# Patient Record
Sex: Female | Born: 1940
Health system: Southern US, Community
[De-identification: ages and names within clinical notes are randomized; demographics above are authoritative.]

## PROBLEM LIST (undated history)

## (undated) DIAGNOSIS — N390 Urinary tract infection, site not specified: Secondary | ICD-10-CM

## (undated) DIAGNOSIS — K219 Gastro-esophageal reflux disease without esophagitis: Secondary | ICD-10-CM

## (undated) DIAGNOSIS — K449 Diaphragmatic hernia without obstruction or gangrene: Secondary | ICD-10-CM

## (undated) DIAGNOSIS — R296 Repeated falls: Secondary | ICD-10-CM

## (undated) DIAGNOSIS — E119 Type 2 diabetes mellitus without complications: Secondary | ICD-10-CM

## (undated) DIAGNOSIS — I8500 Esophageal varices without bleeding: Secondary | ICD-10-CM

## (undated) DIAGNOSIS — K7460 Unspecified cirrhosis of liver: Secondary | ICD-10-CM

## (undated) DIAGNOSIS — E7800 Pure hypercholesterolemia, unspecified: Secondary | ICD-10-CM

## (undated) DIAGNOSIS — F419 Anxiety disorder, unspecified: Secondary | ICD-10-CM

## (undated) DIAGNOSIS — K760 Fatty (change of) liver, not elsewhere classified: Secondary | ICD-10-CM

## (undated) DIAGNOSIS — W19XXXA Unspecified fall, initial encounter: Secondary | ICD-10-CM

## (undated) DIAGNOSIS — I2510 Atherosclerotic heart disease of native coronary artery without angina pectoris: Secondary | ICD-10-CM

## (undated) DIAGNOSIS — M5136 Other intervertebral disc degeneration, lumbar region: Secondary | ICD-10-CM

## (undated) DIAGNOSIS — K589 Irritable bowel syndrome without diarrhea: Secondary | ICD-10-CM

## (undated) DIAGNOSIS — M51369 Other intervertebral disc degeneration, lumbar region without mention of lumbar back pain or lower extremity pain: Secondary | ICD-10-CM

## (undated) DIAGNOSIS — I10 Essential (primary) hypertension: Secondary | ICD-10-CM

## (undated) DIAGNOSIS — Z8673 Personal history of transient ischemic attack (TIA), and cerebral infarction without residual deficits: Secondary | ICD-10-CM

## (undated) DIAGNOSIS — I471 Supraventricular tachycardia, unspecified: Secondary | ICD-10-CM

## (undated) DIAGNOSIS — G4733 Obstructive sleep apnea (adult) (pediatric): Secondary | ICD-10-CM

## (undated) DIAGNOSIS — I313 Pericardial effusion (noninflammatory): Secondary | ICD-10-CM

## (undated) DIAGNOSIS — M549 Dorsalgia, unspecified: Secondary | ICD-10-CM

## (undated) DIAGNOSIS — F32A Depression, unspecified: Secondary | ICD-10-CM

## (undated) DIAGNOSIS — I351 Nonrheumatic aortic (valve) insufficiency: Secondary | ICD-10-CM

## (undated) DIAGNOSIS — R1310 Dysphagia, unspecified: Secondary | ICD-10-CM

## (undated) DIAGNOSIS — D649 Anemia, unspecified: Secondary | ICD-10-CM

## (undated) DIAGNOSIS — I3139 Other pericardial effusion (noninflammatory): Secondary | ICD-10-CM

## (undated) DIAGNOSIS — I639 Cerebral infarction, unspecified: Secondary | ICD-10-CM

## (undated) DIAGNOSIS — R569 Unspecified convulsions: Secondary | ICD-10-CM

## (undated) DIAGNOSIS — S7292XA Unspecified fracture of left femur, initial encounter for closed fracture: Secondary | ICD-10-CM

## (undated) DIAGNOSIS — I6523 Occlusion and stenosis of bilateral carotid arteries: Secondary | ICD-10-CM

## (undated) DIAGNOSIS — I4891 Unspecified atrial fibrillation: Secondary | ICD-10-CM

## (undated) HISTORY — DX: Unspecified atrial fibrillation: I48.91

## (undated) HISTORY — PX: DILATION AND CURETTAGE OF UTERUS: SHX78

## (undated) HISTORY — DX: Repeated falls: R29.6

## (undated) HISTORY — DX: Unspecified convulsions: R56.9

## (undated) HISTORY — PX: CARDIAC CATHETERIZATION: SHX172

## (undated) HISTORY — PX: TUBAL LIGATION: SHX77

## (undated) HISTORY — DX: Supraventricular tachycardia, unspecified: I47.10

## (undated) HISTORY — DX: Other pericardial effusion (noninflammatory): I31.39

## (undated) HISTORY — PX: WRIST SURGERY: SHX841

## (undated) HISTORY — DX: Dysphagia, unspecified: R13.10

## (undated) HISTORY — DX: Supraventricular tachycardia: I47.1

## (undated) HISTORY — PX: TONSILLECTOMY: SUR1361

## (undated) HISTORY — DX: Unspecified fall, initial encounter: W19.XXXA

## (undated) HISTORY — PX: COLONOSCOPY: SHX174

## (undated) HISTORY — DX: Pericardial effusion (noninflammatory): I31.3

## (undated) HISTORY — PX: APPENDECTOMY: SHX54

## (undated) HISTORY — PX: OTHER SURGICAL HISTORY: SHX169

## (undated) HISTORY — DX: Atherosclerotic heart disease of native coronary artery without angina pectoris: I25.10

## (undated) HISTORY — PX: ERCP: SHX60

## (undated) SURGERY — Surgical Case
Anesthesia: *Unknown

---

## 1970-09-08 HISTORY — PX: VAGINAL HYSTERECTOMY: SUR661

## 1970-09-08 HISTORY — PX: ANAL FISSURE REPAIR: SHX2312

## 1983-09-09 HISTORY — PX: BACK SURGERY: SHX140

## 1998-06-23 ENCOUNTER — Inpatient Hospital Stay (HOSPITAL_COMMUNITY): Admission: AD | Admit: 1998-06-23 | Discharge: 1998-06-26 | Payer: Self-pay | Admitting: Internal Medicine

## 1998-06-24 ENCOUNTER — Encounter (HOSPITAL_BASED_OUTPATIENT_CLINIC_OR_DEPARTMENT_OTHER): Payer: Self-pay | Admitting: Internal Medicine

## 2001-02-08 ENCOUNTER — Ambulatory Visit (HOSPITAL_COMMUNITY): Admission: RE | Admit: 2001-02-08 | Discharge: 2001-02-08 | Payer: Self-pay | Admitting: Internal Medicine

## 2001-02-08 ENCOUNTER — Encounter: Payer: Self-pay | Admitting: Internal Medicine

## 2001-02-16 ENCOUNTER — Ambulatory Visit (HOSPITAL_COMMUNITY): Admission: RE | Admit: 2001-02-16 | Discharge: 2001-02-16 | Payer: Self-pay | Admitting: Internal Medicine

## 2001-02-16 ENCOUNTER — Encounter: Payer: Self-pay | Admitting: Internal Medicine

## 2001-08-17 ENCOUNTER — Encounter: Payer: Self-pay | Admitting: Internal Medicine

## 2001-08-17 ENCOUNTER — Ambulatory Visit (HOSPITAL_COMMUNITY): Admission: RE | Admit: 2001-08-17 | Discharge: 2001-08-17 | Payer: Self-pay | Admitting: Internal Medicine

## 2002-03-14 ENCOUNTER — Encounter: Payer: Self-pay | Admitting: Internal Medicine

## 2002-03-14 ENCOUNTER — Ambulatory Visit (HOSPITAL_COMMUNITY): Admission: RE | Admit: 2002-03-14 | Discharge: 2002-03-14 | Payer: Self-pay | Admitting: Internal Medicine

## 2003-06-13 ENCOUNTER — Ambulatory Visit (HOSPITAL_COMMUNITY): Admission: RE | Admit: 2003-06-13 | Discharge: 2003-06-13 | Payer: Self-pay | Admitting: Internal Medicine

## 2003-06-13 ENCOUNTER — Encounter: Payer: Self-pay | Admitting: Internal Medicine

## 2004-06-13 ENCOUNTER — Ambulatory Visit (HOSPITAL_COMMUNITY): Admission: RE | Admit: 2004-06-13 | Discharge: 2004-06-13 | Payer: Self-pay | Admitting: Internal Medicine

## 2005-02-08 ENCOUNTER — Emergency Department (HOSPITAL_COMMUNITY): Admission: EM | Admit: 2005-02-08 | Discharge: 2005-02-08 | Payer: Self-pay | Admitting: *Deleted

## 2005-04-01 ENCOUNTER — Inpatient Hospital Stay (HOSPITAL_BASED_OUTPATIENT_CLINIC_OR_DEPARTMENT_OTHER): Admission: RE | Admit: 2005-04-01 | Discharge: 2005-04-01 | Payer: Self-pay | Admitting: Interventional Cardiology

## 2005-09-09 ENCOUNTER — Ambulatory Visit (HOSPITAL_COMMUNITY): Admission: RE | Admit: 2005-09-09 | Discharge: 2005-09-09 | Payer: Self-pay | Admitting: Internal Medicine

## 2005-10-17 ENCOUNTER — Other Ambulatory Visit: Admission: RE | Admit: 2005-10-17 | Discharge: 2005-10-17 | Payer: Self-pay | Admitting: Internal Medicine

## 2006-06-09 ENCOUNTER — Ambulatory Visit (HOSPITAL_COMMUNITY): Admission: RE | Admit: 2006-06-09 | Discharge: 2006-06-09 | Payer: Self-pay | Admitting: Family Medicine

## 2006-08-06 ENCOUNTER — Observation Stay (HOSPITAL_COMMUNITY): Admission: AD | Admit: 2006-08-06 | Discharge: 2006-08-06 | Payer: Self-pay | Admitting: Cardiology

## 2006-08-06 ENCOUNTER — Encounter: Payer: Self-pay | Admitting: Emergency Medicine

## 2006-09-08 HISTORY — PX: EYE SURGERY: SHX253

## 2006-09-21 ENCOUNTER — Ambulatory Visit (HOSPITAL_COMMUNITY): Admission: RE | Admit: 2006-09-21 | Discharge: 2006-09-21 | Payer: Self-pay | Admitting: Family Medicine

## 2006-10-29 ENCOUNTER — Ambulatory Visit: Payer: Self-pay | Admitting: Internal Medicine

## 2006-12-18 ENCOUNTER — Ambulatory Visit: Payer: Self-pay | Admitting: Internal Medicine

## 2006-12-18 ENCOUNTER — Encounter (INDEPENDENT_AMBULATORY_CARE_PROVIDER_SITE_OTHER): Payer: Self-pay | Admitting: Specialist

## 2006-12-18 ENCOUNTER — Ambulatory Visit (HOSPITAL_COMMUNITY): Admission: RE | Admit: 2006-12-18 | Discharge: 2006-12-18 | Payer: Self-pay | Admitting: Internal Medicine

## 2006-12-25 ENCOUNTER — Ambulatory Visit (HOSPITAL_COMMUNITY): Admission: RE | Admit: 2006-12-25 | Discharge: 2006-12-25 | Payer: Self-pay | Admitting: Internal Medicine

## 2006-12-30 ENCOUNTER — Ambulatory Visit: Payer: Self-pay | Admitting: Internal Medicine

## 2007-01-06 ENCOUNTER — Encounter (INDEPENDENT_AMBULATORY_CARE_PROVIDER_SITE_OTHER): Payer: Self-pay | Admitting: Specialist

## 2007-01-06 ENCOUNTER — Ambulatory Visit (HOSPITAL_COMMUNITY): Admission: RE | Admit: 2007-01-06 | Discharge: 2007-01-06 | Payer: Self-pay | Admitting: Internal Medicine

## 2007-01-21 ENCOUNTER — Ambulatory Visit: Payer: Self-pay | Admitting: Internal Medicine

## 2007-02-27 IMAGING — CR DG CHEST 1V PORT
1 series · 1 of 1 positions shown · non-contrast
Comparison: none

HISTORY: Chest pain

PORTABLE CHEST ONE VIEW:
Portable exam 1168 hours compared to 06/09/2006
Upper normal heart size.
Normal mediastinal contours and pulmonary vascularity.
Lungs clear.
No pleural effusion or pneumothorax.

[view not recorded]
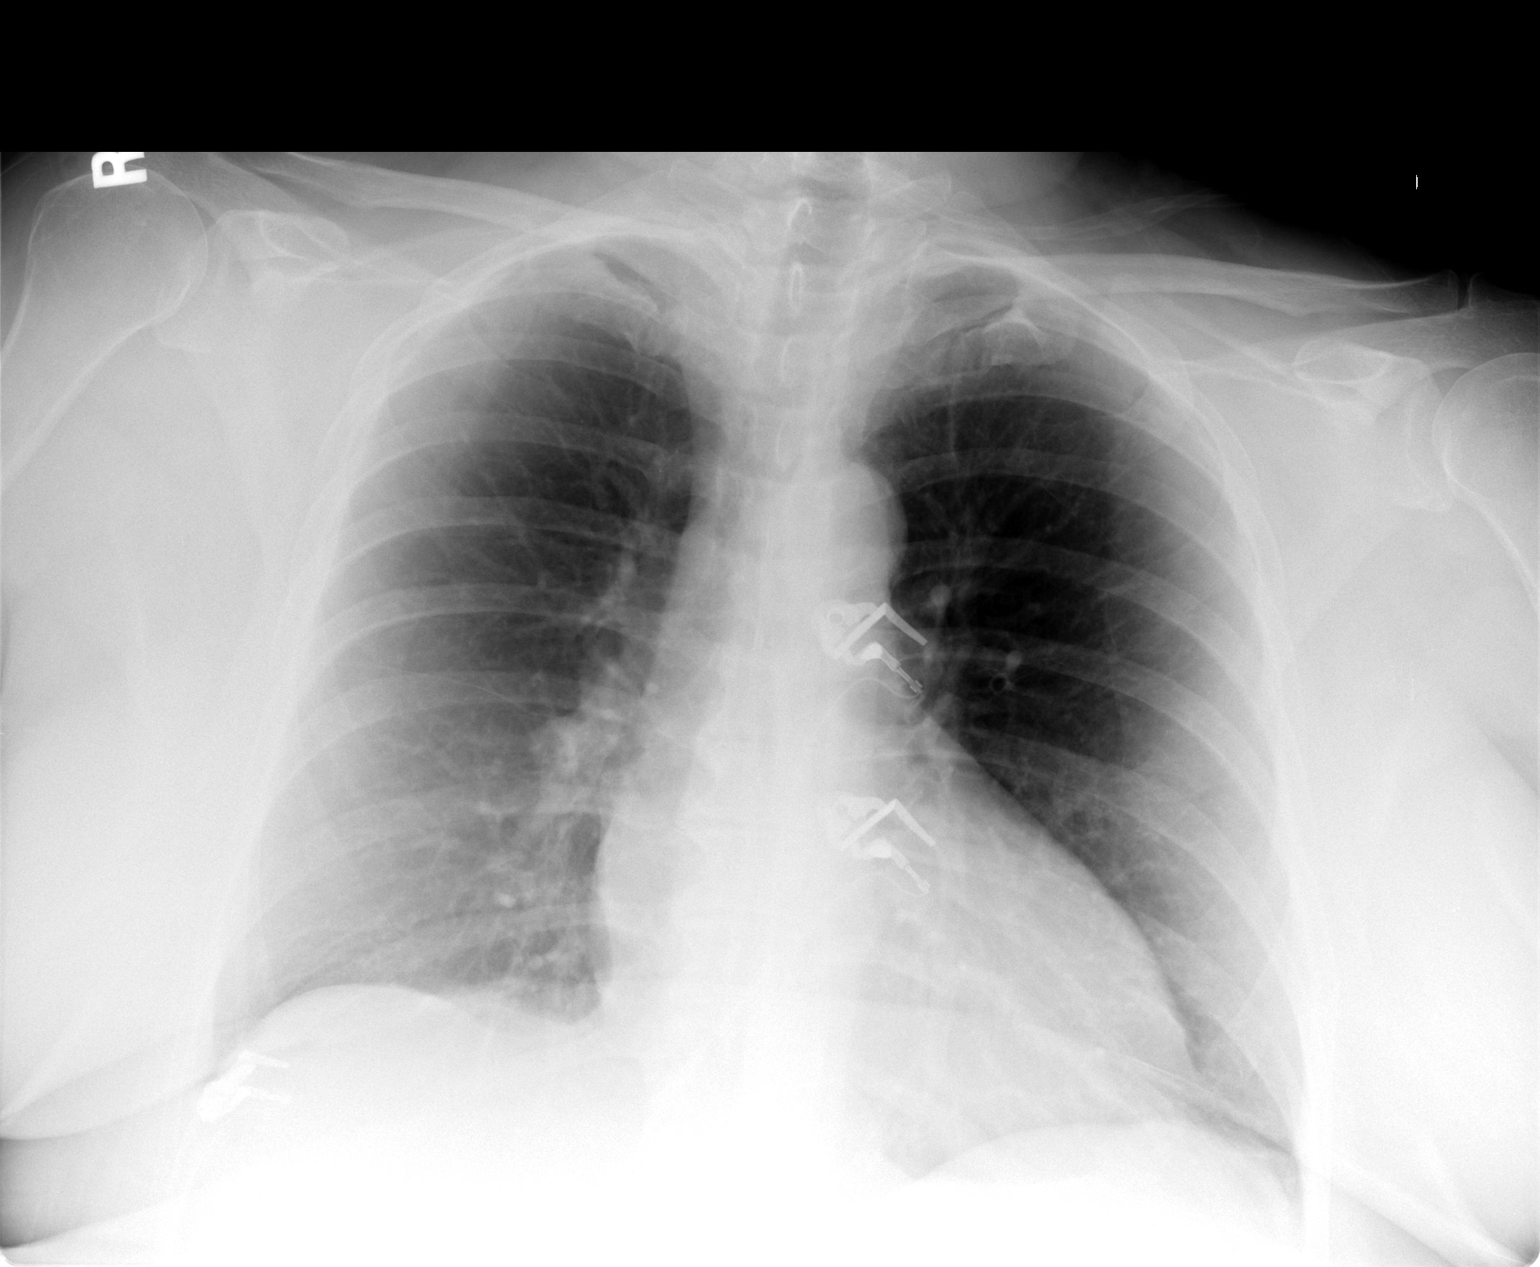

[1 of 1 positions shown; findings below may reference images not displayed]

IMPRESSION: No acute abnormalities.

## 2007-05-18 ENCOUNTER — Ambulatory Visit (HOSPITAL_COMMUNITY): Admission: RE | Admit: 2007-05-18 | Discharge: 2007-05-18 | Payer: Self-pay | Admitting: Ophthalmology

## 2007-07-18 IMAGING — US US ABDOMEN COMPLETE
1 series · 14 of 25 positions shown · non-contrast
Comparison: none

HISTORY: Varices on endoscopy, question cirrhosis, history diabetes

ULTRASOUND ABDOMEN COMPLETE:
TECHNIQUE: Complete abdominal ultrasound examination was performed including
evaluation of the liver, gallbladder, bile ducts, pancreas, kidneys, spleen,
IVC, and abdominal aorta.

[Series 1: us abdomen complete · 0.37mm/px · 14 of 62 slices shown]
[im 1/62]
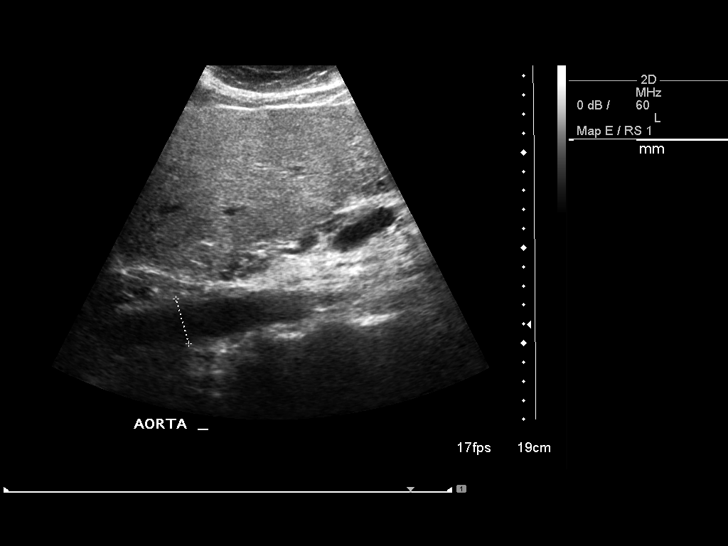
[im 6/62]
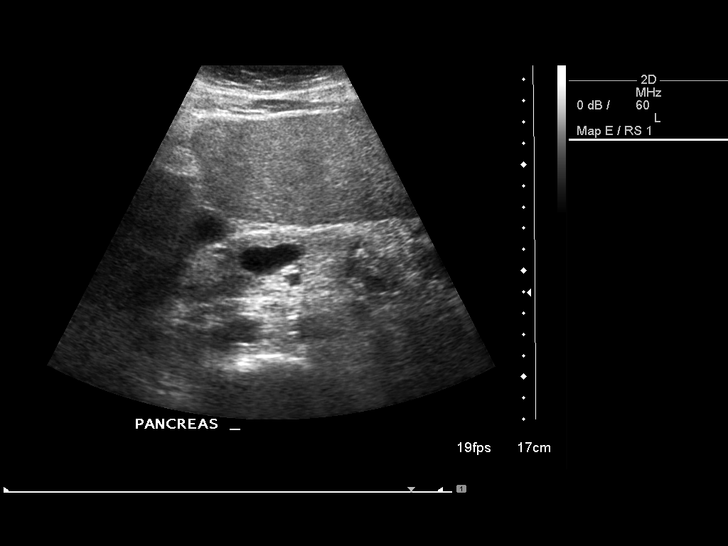
[im 11/62]
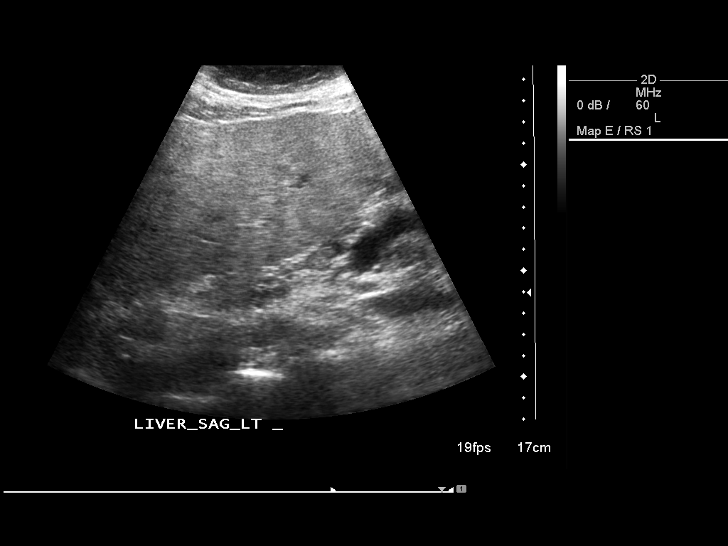
[im 16/62]
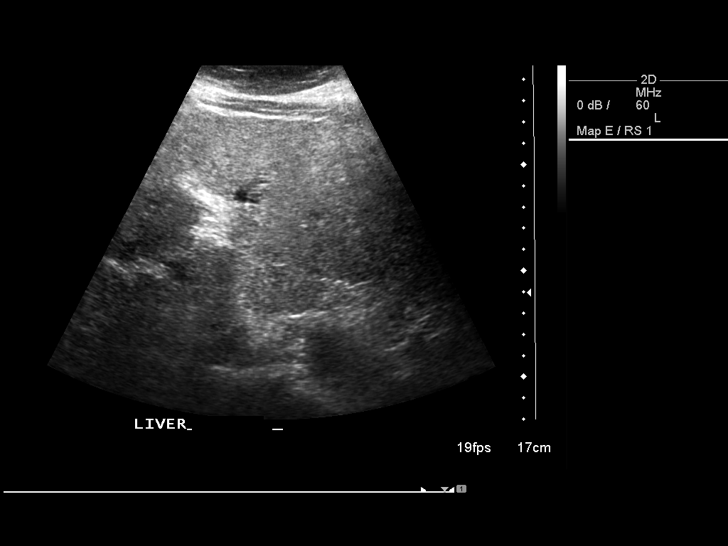
[im 21/62]
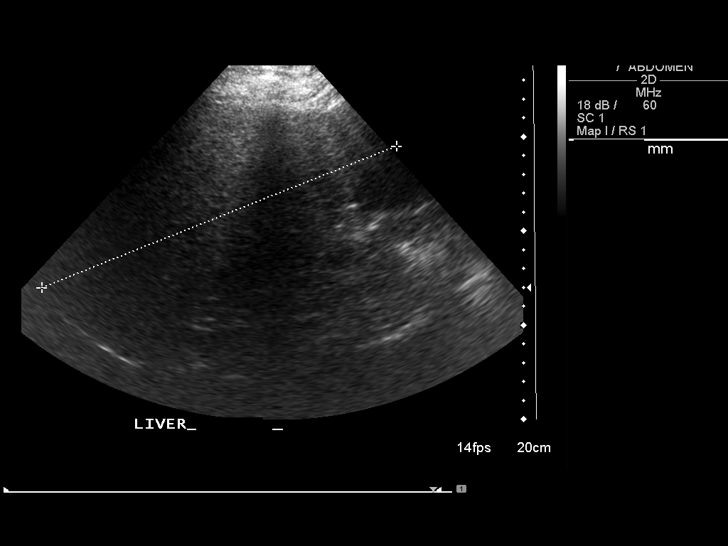
[im 23/62]
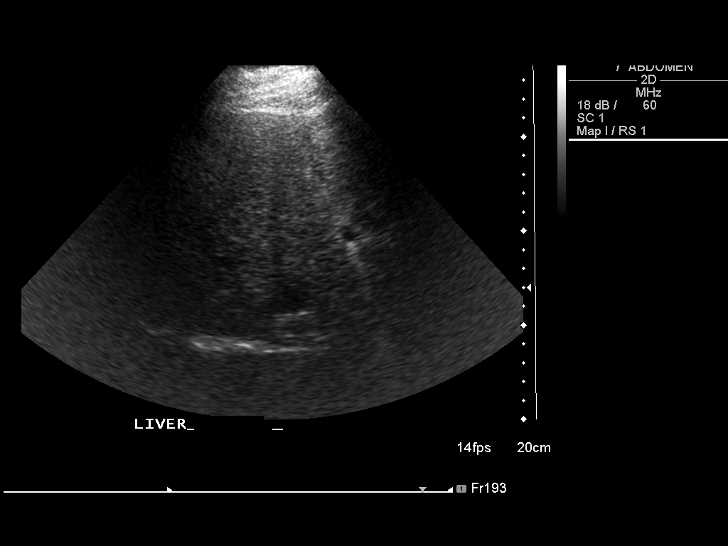
[im 28/62]
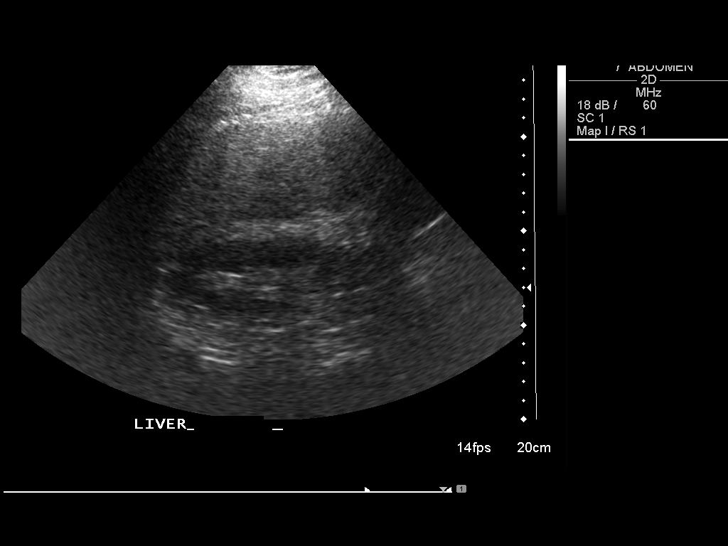
[im 34/62]
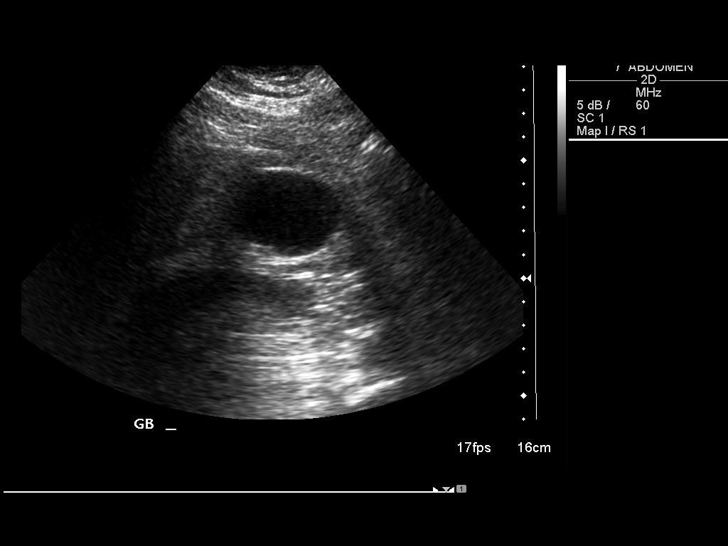
[im 39/62]
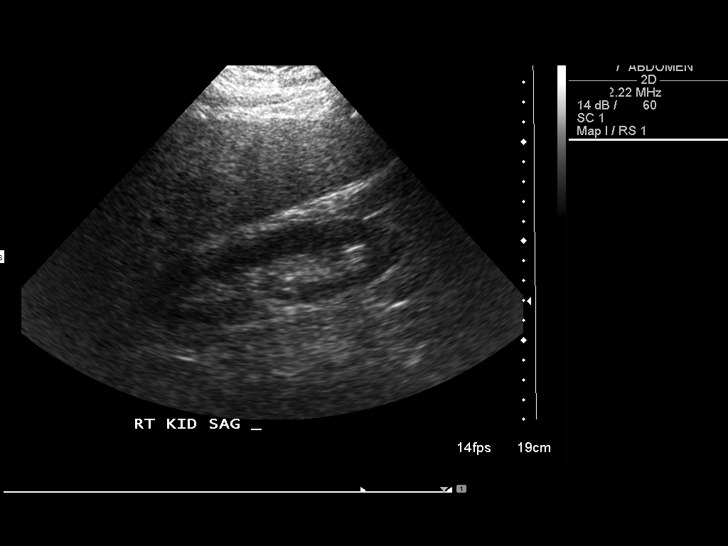
[im 41/62]
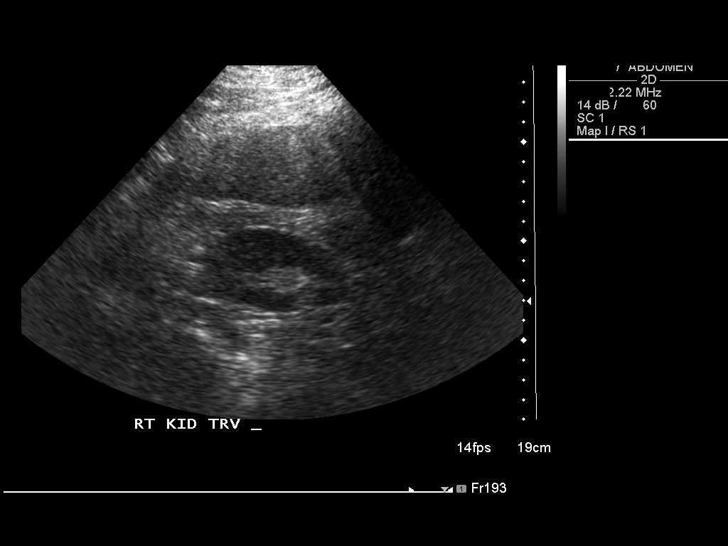
[im 46/62]
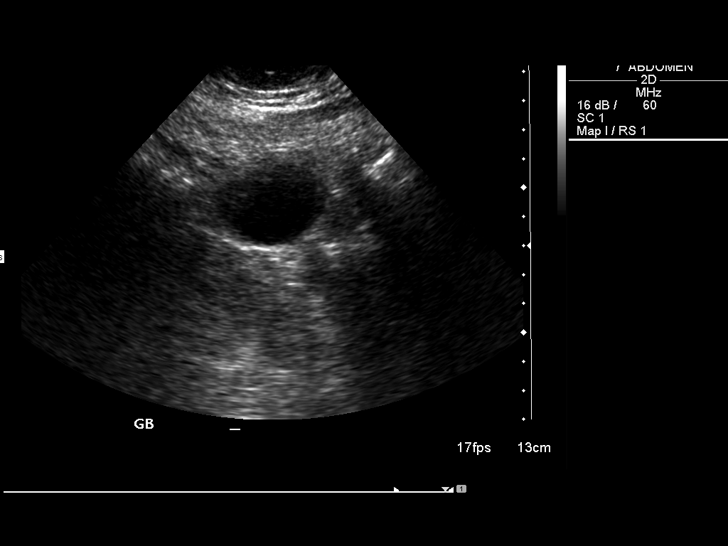
[im 51/62]
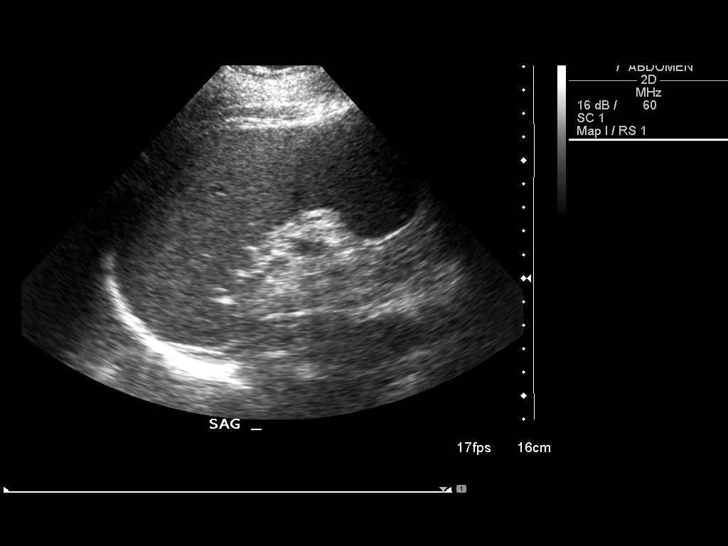
[im 56/62]
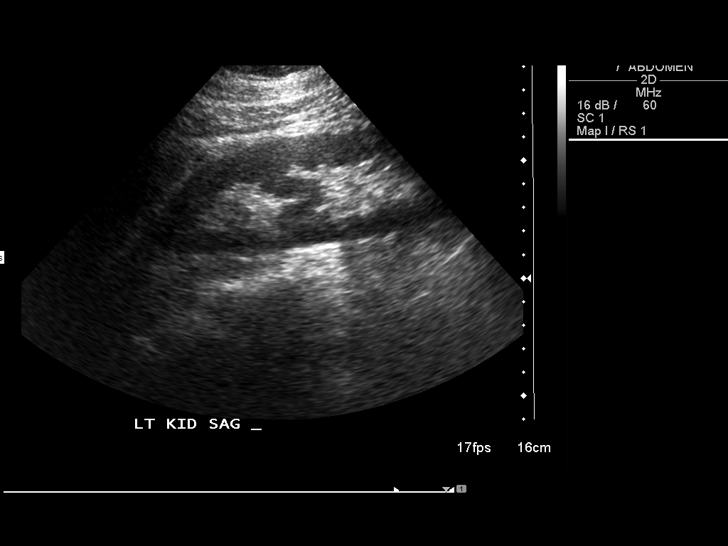
[im 62/62]
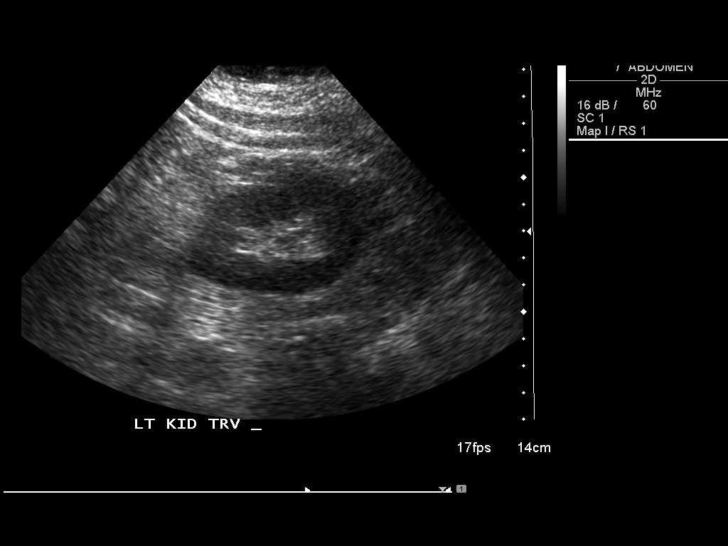

[14 of 25 positions shown; findings below may reference images not displayed]

Gallbladder normal without stones or wall thickening.
Common bile duct normal caliber 4 mm diameter.
Echogenic liver with slight scalloping of edges and prominence of left lobe,
compatible with cirrhosis.
Liver enlarged measuring 20.2 cm length.
Spleen enlarged at 14.2 cm length without focal mass.
Kidneys prominent in size but normal in morphology, 13.2 cm length right and
14.9 cm length left, question related to diabetes.
Aorta and IVC normal.
No free fluid.
IMPRESSION: Hepatosplenomegaly with mild cirrhotic changes of liver.
No focal hepatic mass or definite intra-abdominal varices identified, though CT
abdomen with contrast is a more sensitive exam for detection of intra-abdominal
varices.

## 2007-07-30 IMAGING — US US BIOPSY
1 series · 12 of 12 positions shown · non-contrast
Comparison: none

CLINICAL DATA: Cirrhosis of the liver secondary to NASH.  Request has been made for random liver core biopsy. 
 ULTRASOUND-GUIDED RANDOM LIVER CORE BIOPSY:
 Procedure: The procedure in detail was discussed with the patient and her questions answered.  Potential complications including that of the risk of infection, bleeding, organ damage, and death were discussed with the patient?s apparent understanding.  Written consent was obtained. 
 Ultrasound was used to mark an appropriate skin site. The patient was prepped and draped in the normal sterile fashion and 1% lidocaine was used for local anesthesia.  Through a 17-gauge guiding trocar, 3 passes were made into the right hepatic lobe with an 18-gauge biopsy needle.  The specimens were sent to the laboratory for further analysis.  Post-imaging of the liver revealed no evidence of bleeding or hematoma.  The patient appeared to tolerate the procedure well with no immediate complications. 
 Cardiac and respiratory monitoring were provided by the radiology nurse throughout the procedure.  Moderate sedation in the form of 4 mg of Versed and 125 mcg of fentanyl was administered over a total sedation time of 20 minutes.

[Series 1: unknown · 0.28mm/px · 12 of 12 slices shown]
[im 1/12]
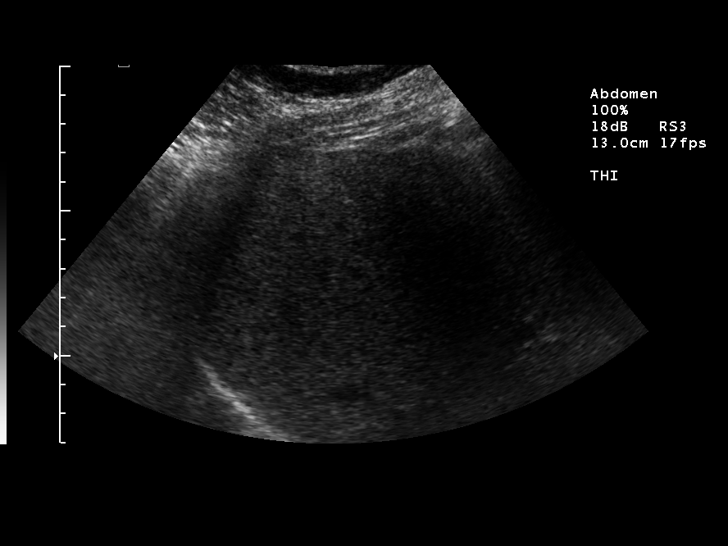
[im 2/12]
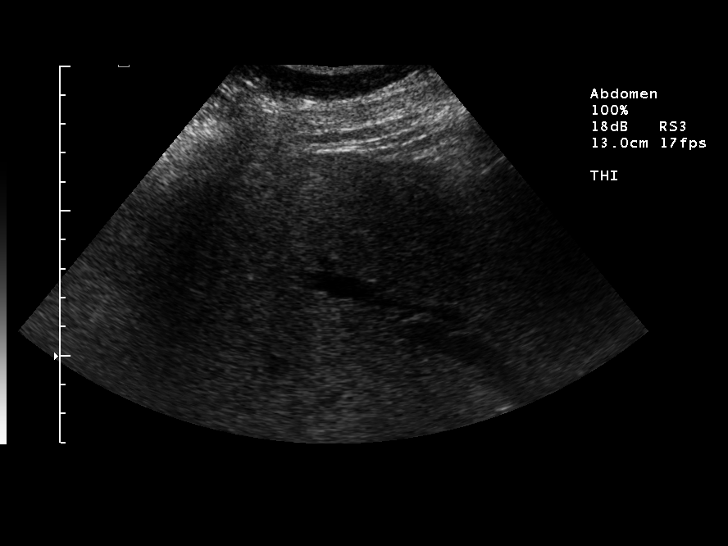
[im 3/12]
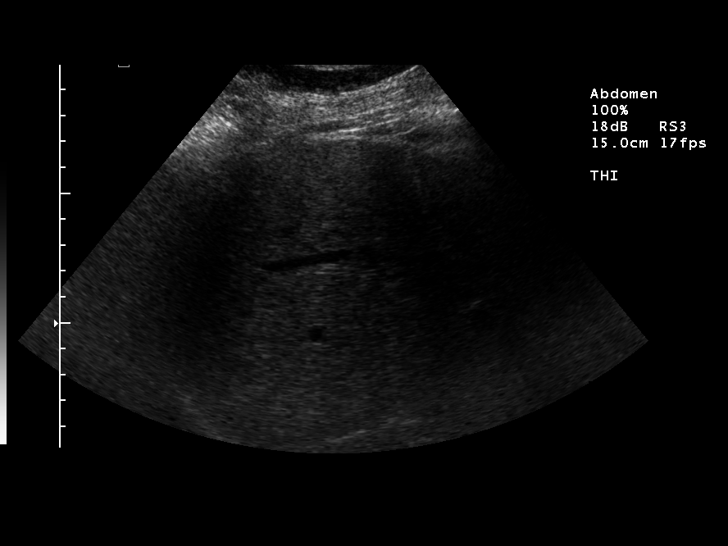
[im 4/12]
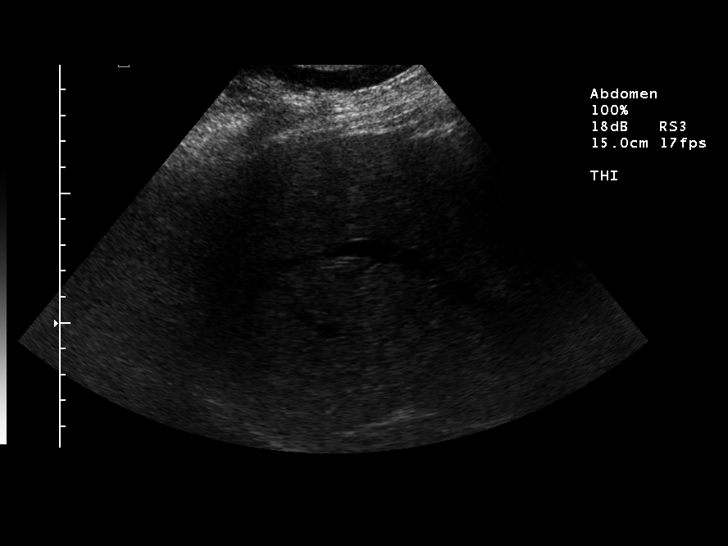
[im 5/12]
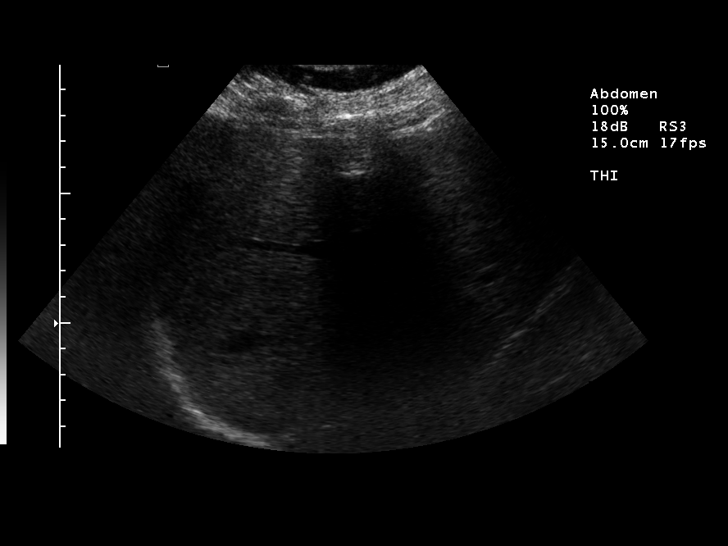
[im 6/12]
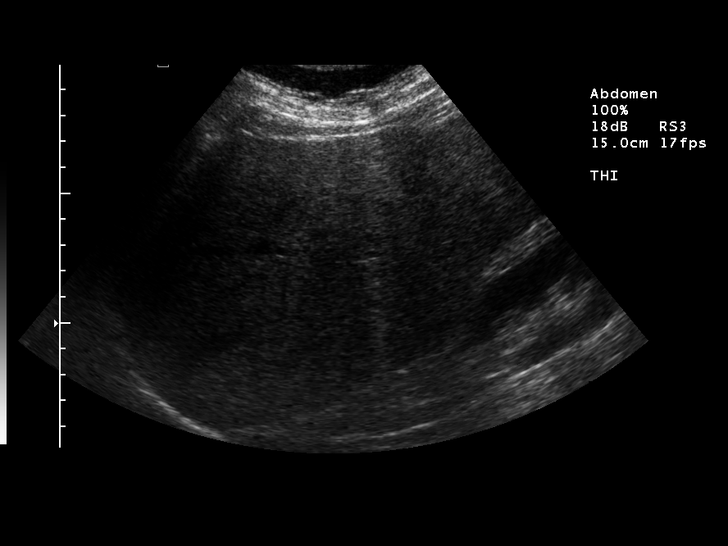
[im 7/12]
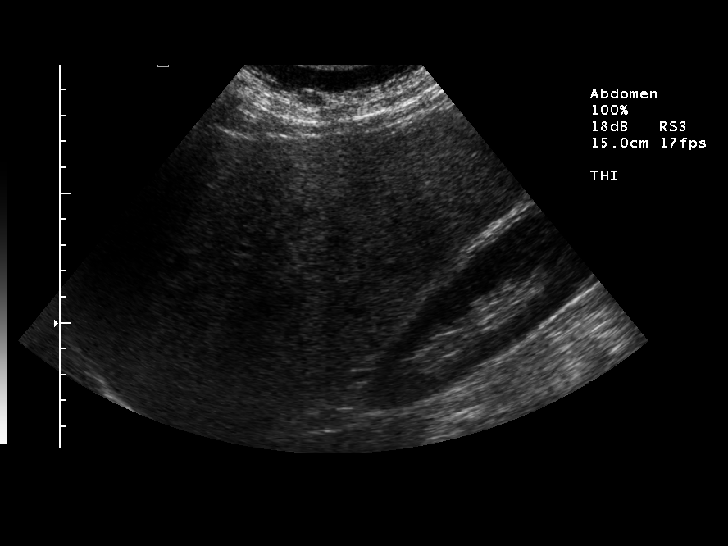
[im 8/12]
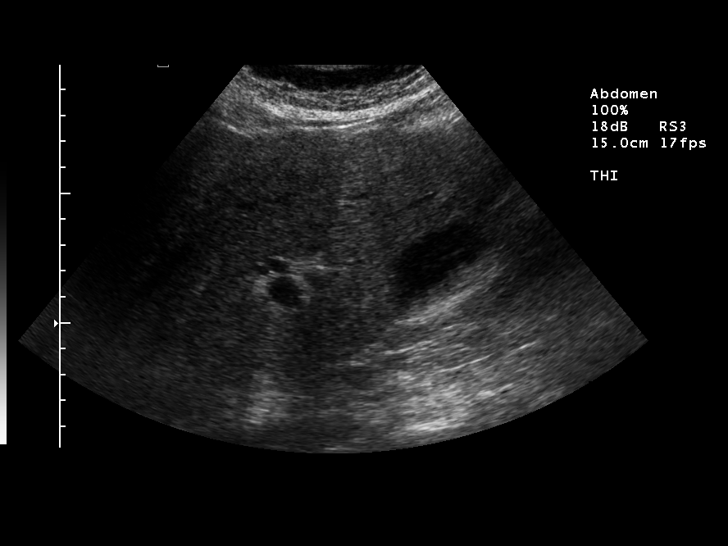
[im 9/12]
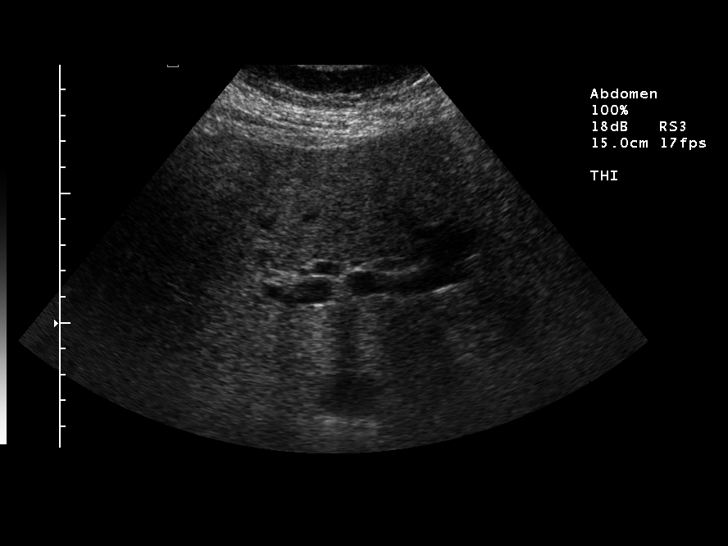
[im 10/12]
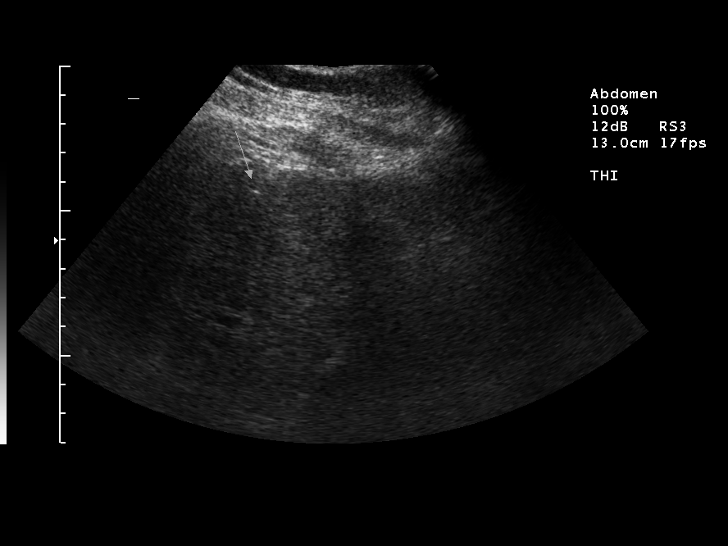
[im 11/12]
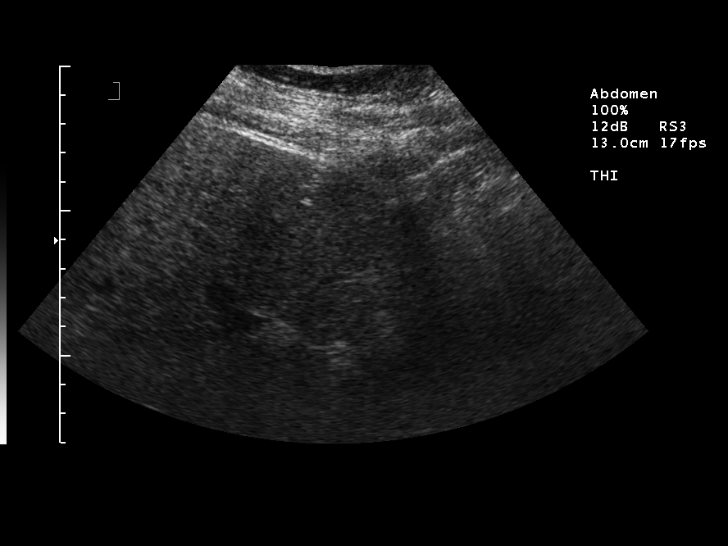
[im 12/12]
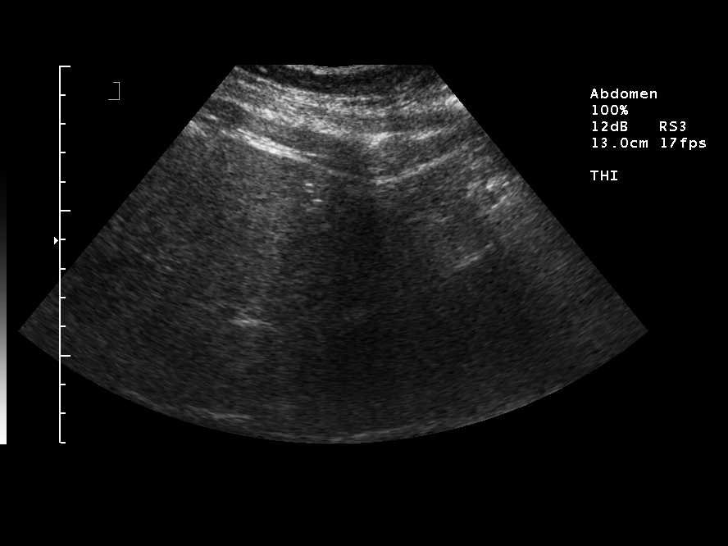

[12 of 12 positions shown; findings below may reference images not displayed]

IMPRESSION: Successful random liver core biopsy under ultrasound guidance.

## 2007-08-02 ENCOUNTER — Ambulatory Visit (HOSPITAL_COMMUNITY): Admission: RE | Admit: 2007-08-02 | Discharge: 2007-08-02 | Payer: Self-pay | Admitting: Ophthalmology

## 2007-09-29 ENCOUNTER — Ambulatory Visit (HOSPITAL_COMMUNITY): Admission: RE | Admit: 2007-09-29 | Discharge: 2007-09-29 | Payer: Self-pay | Admitting: Family Medicine

## 2008-01-25 ENCOUNTER — Emergency Department (HOSPITAL_COMMUNITY): Admission: EM | Admit: 2008-01-25 | Discharge: 2008-01-26 | Payer: Self-pay | Admitting: Emergency Medicine

## 2008-01-26 ENCOUNTER — Observation Stay (HOSPITAL_COMMUNITY): Admission: EM | Admit: 2008-01-26 | Discharge: 2008-01-27 | Payer: Self-pay | Admitting: Emergency Medicine

## 2008-02-20 IMAGING — CR DG CHEST 2V
2 series · 2 of 2 positions shown · non-contrast
Comparison: 08/06/06.

CLINICAL DATA: Preop for eye surgery.
 CHEST - 2 VIEW:

[view not recorded (1 of 2)]
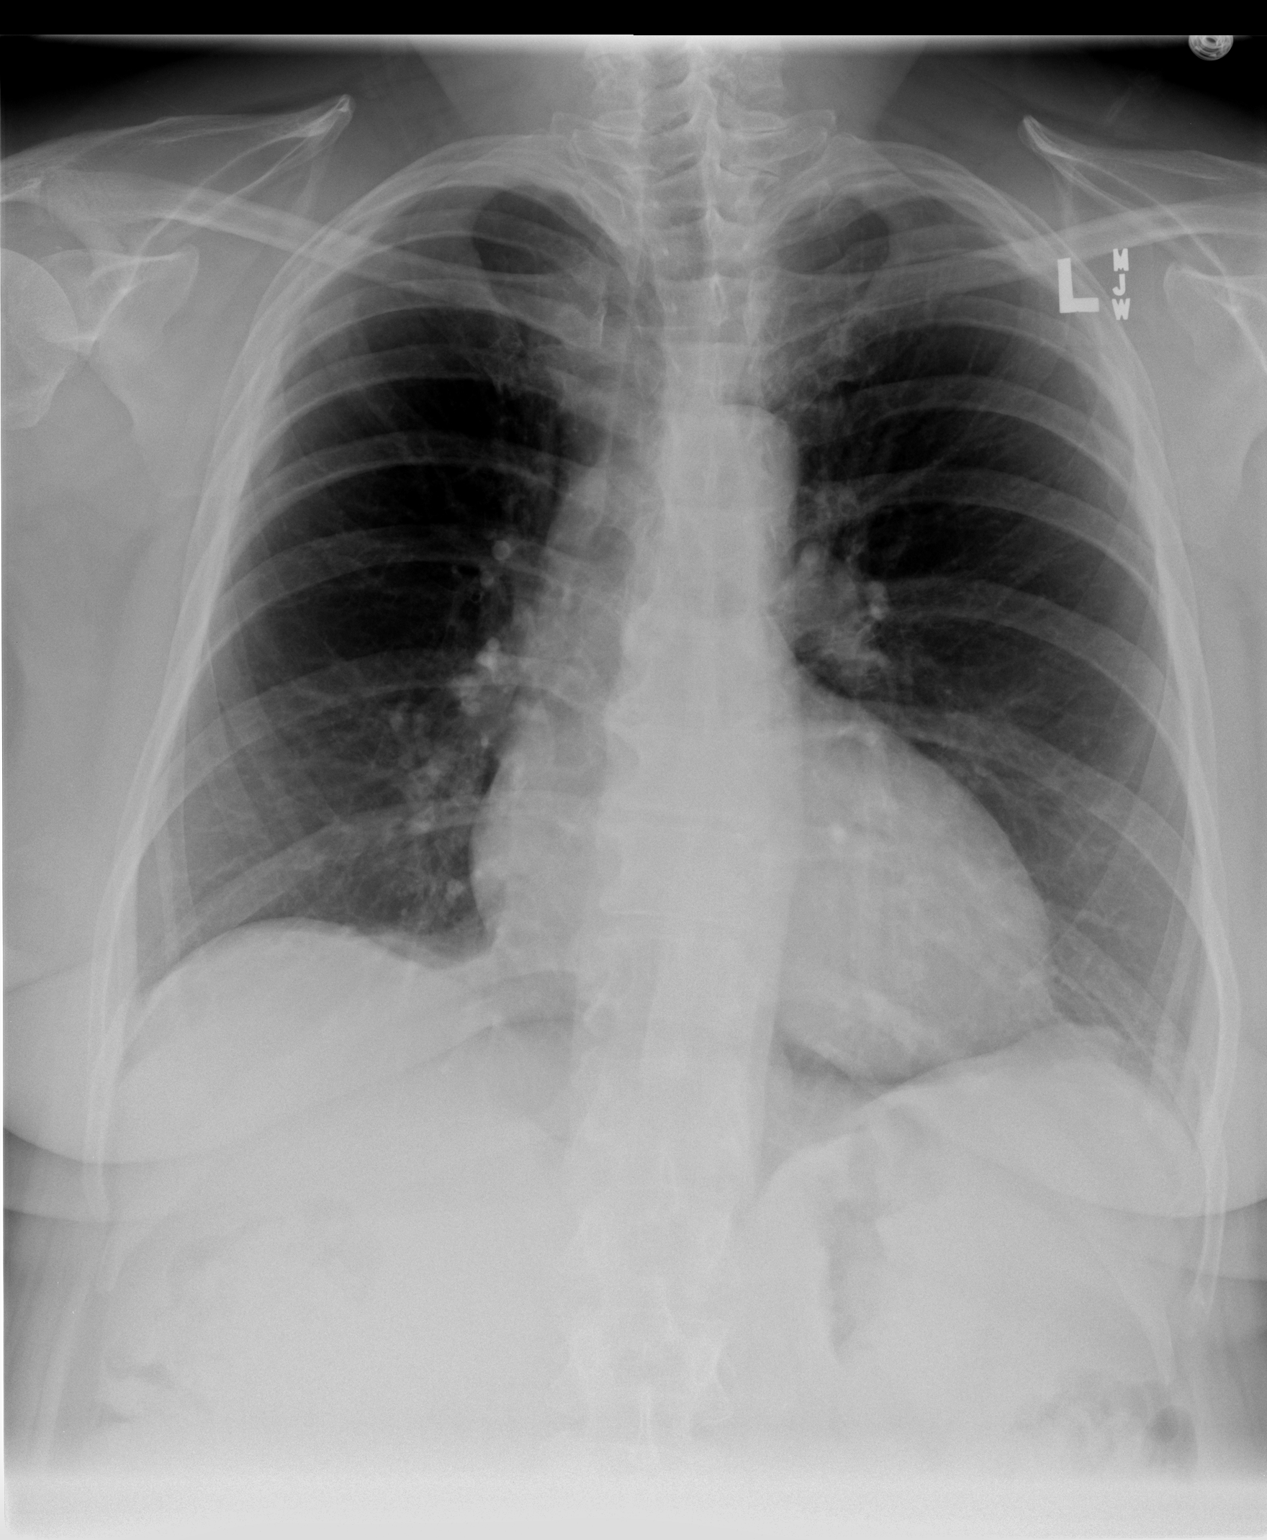

[view not recorded (2 of 2)]
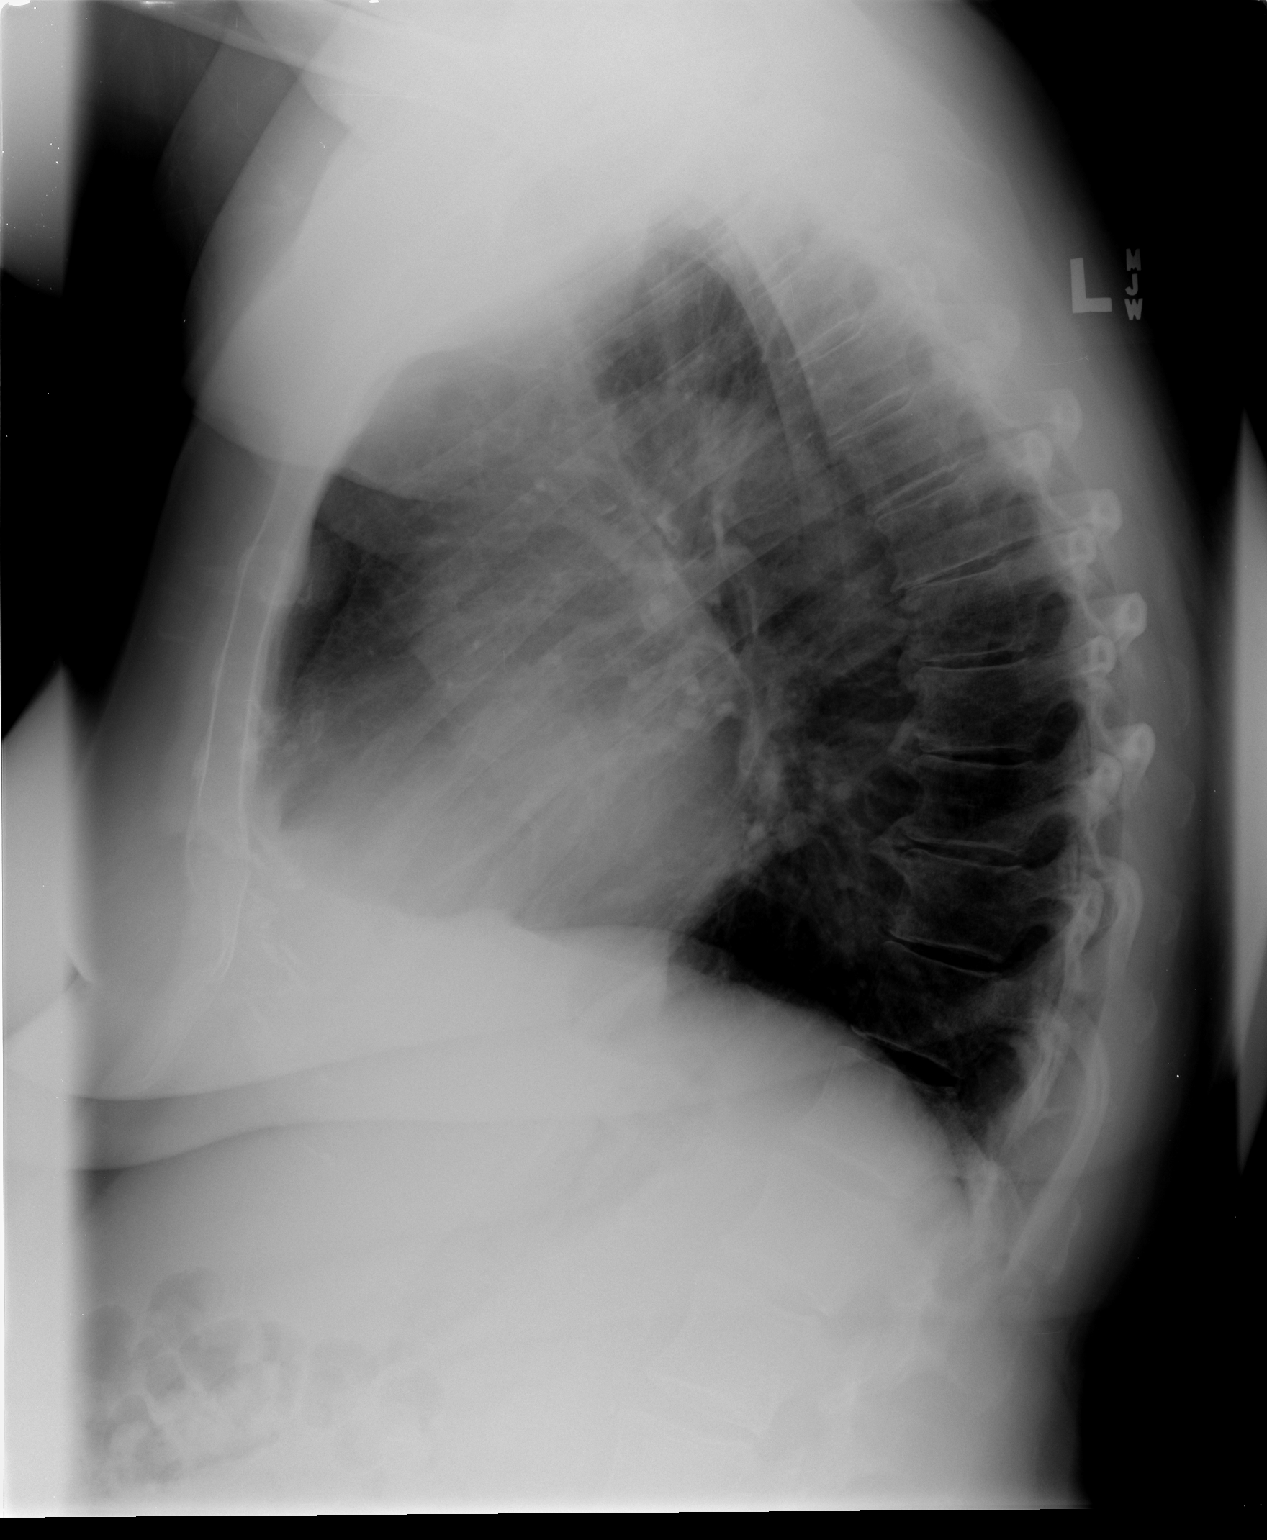

[2 of 2 positions shown; findings below may reference images not displayed]

FINDINGS: Two views of the chest show the lungs to be clear.  The heart is borderline enlarged.   No bony abnormality is seen.
IMPRESSION: No active lung disease.

## 2008-04-10 ENCOUNTER — Ambulatory Visit (HOSPITAL_COMMUNITY): Admission: RE | Admit: 2008-04-10 | Discharge: 2008-04-10 | Payer: Self-pay | Admitting: Family Medicine

## 2008-04-21 IMAGING — US US BREAST*L*
1 series · 13 of 17 positions shown · non-contrast
Comparison: Previous examinations.

BILATERAL DIAGNOSTIC MAMMOGRAM

LEFT BREAST ULTRASOUND
BILATERAL DIAGNOSTIC MAMMOGRAM AND LEFT BREAST ULTRASOUND:
CLINICAL DATA: Mass felt on recent physical examination in the superior aspect of the left breast.

[Series 1: unknown · 0.07mm/px · 13 of 17 slices shown]
[im 1/17]
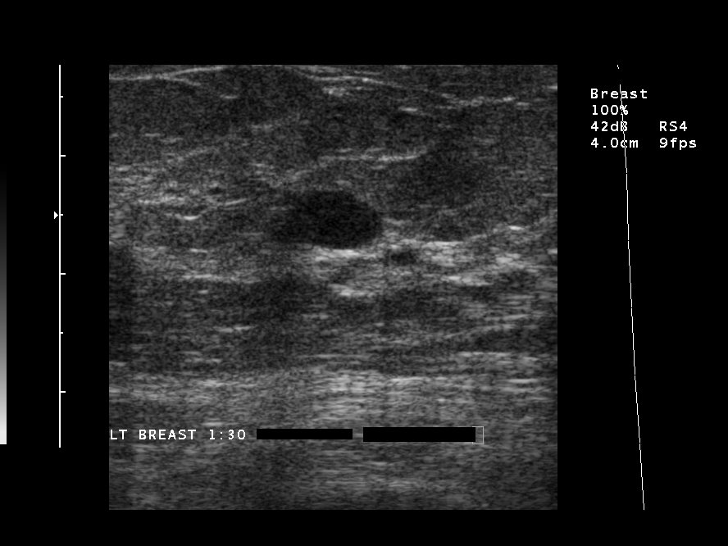
[im 2/17]
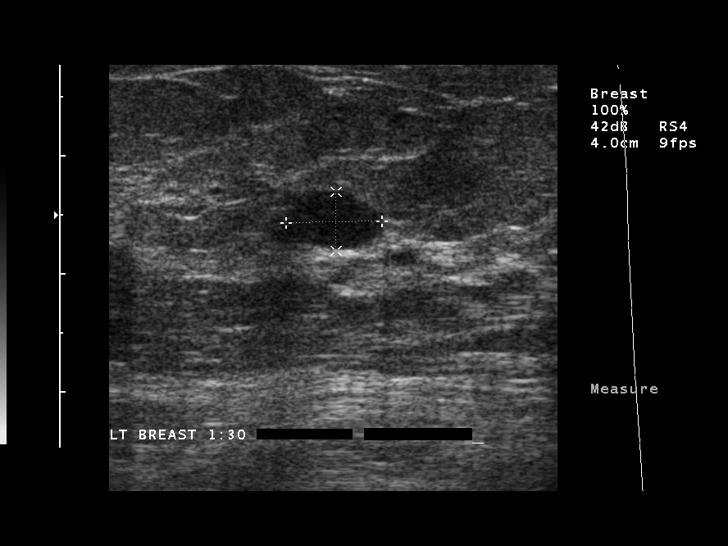
[im 4/17]
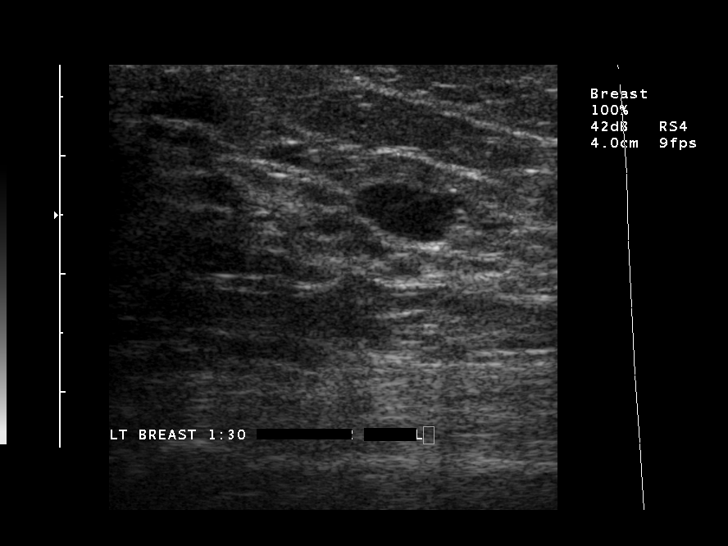
[im 5/17]
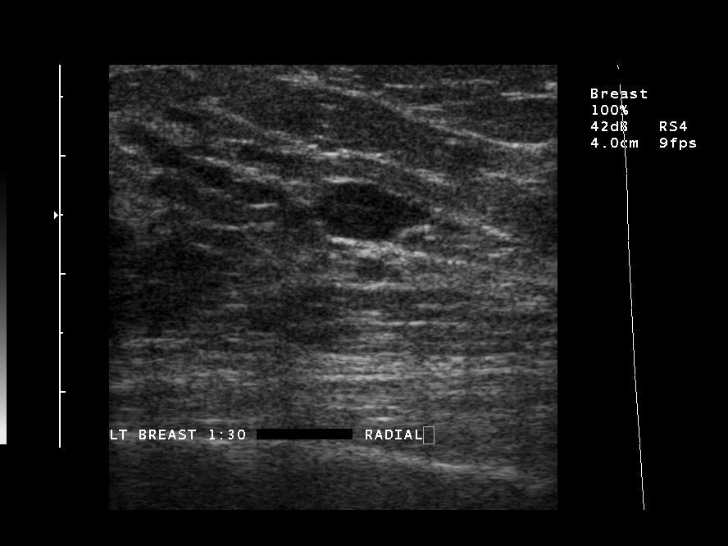
[im 6/17]
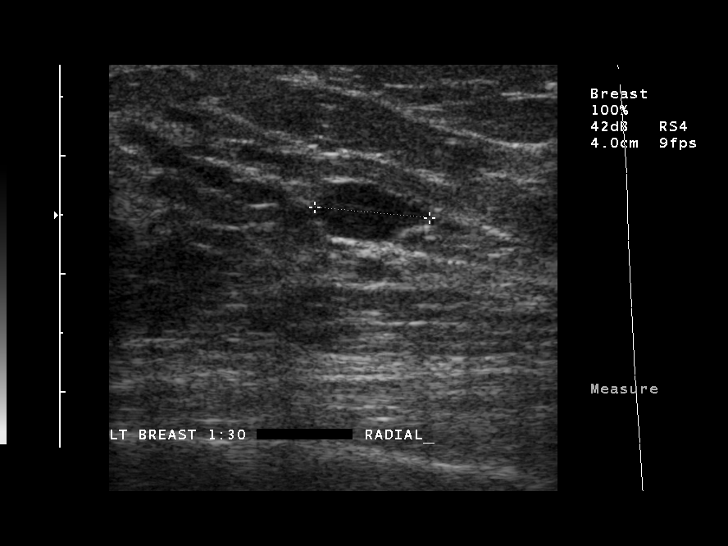
[im 8/17]
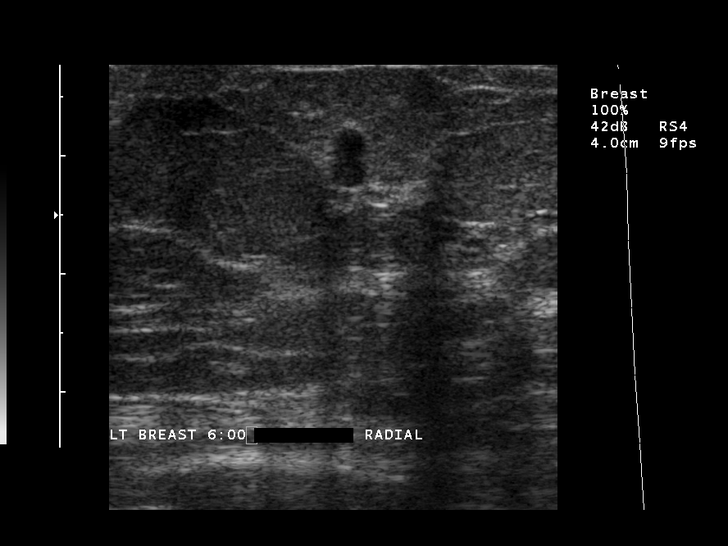
[im 9/17]
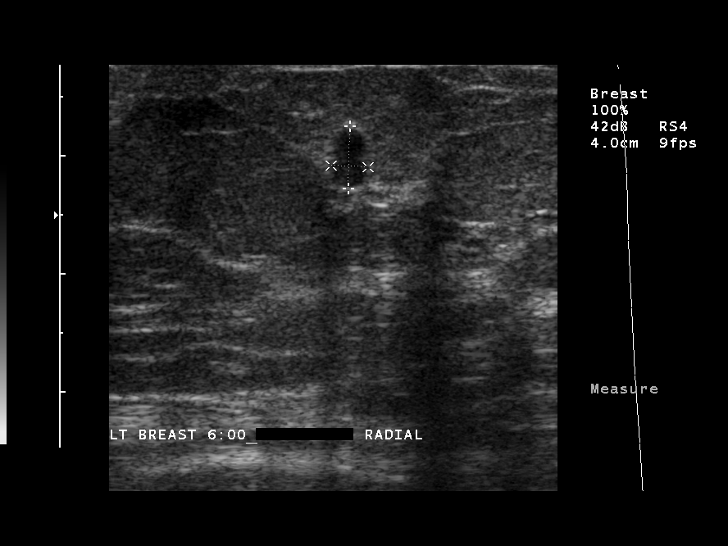
[im 10/17]
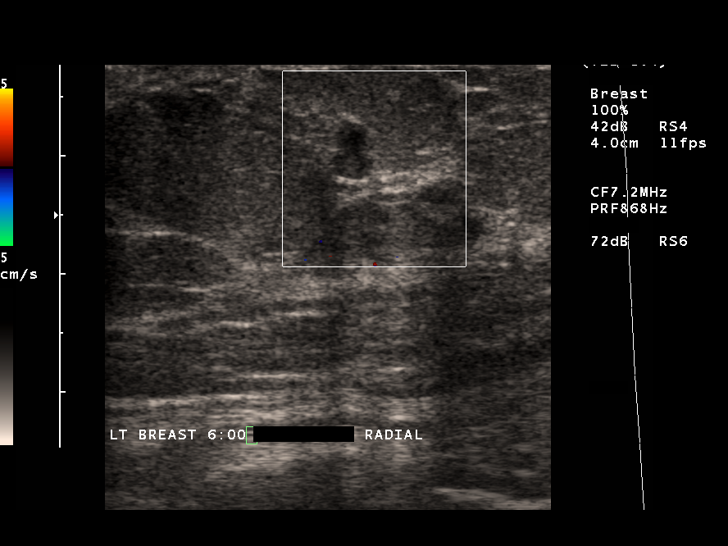
[im 12/17]
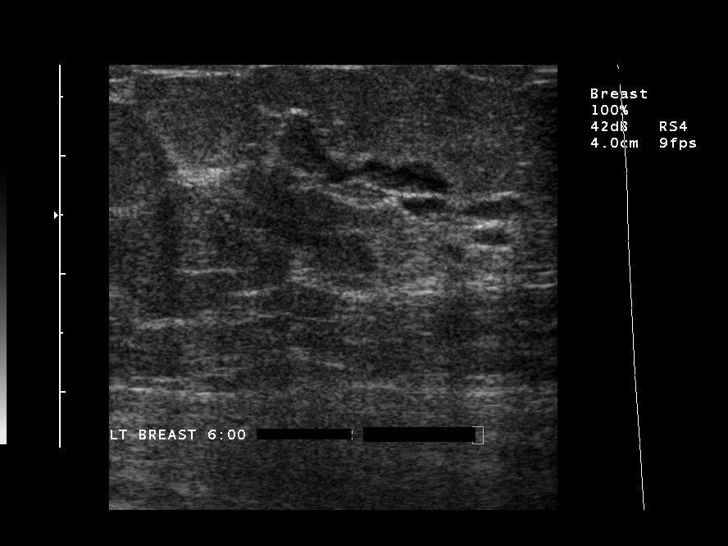
[im 13/17]
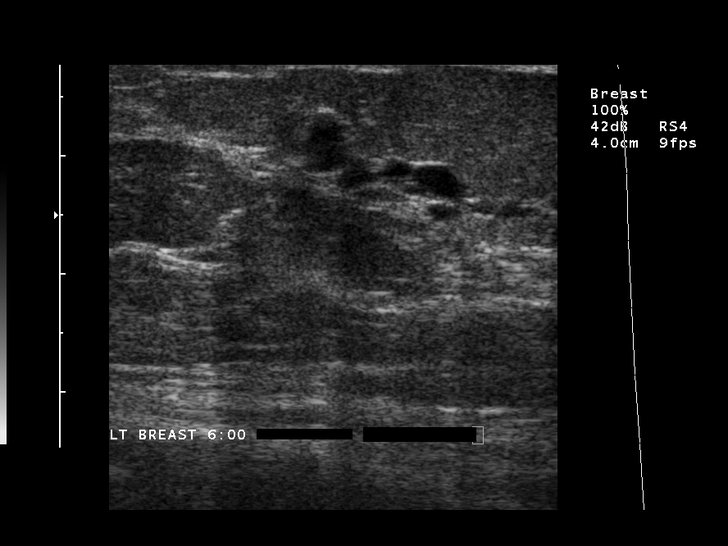
[im 14/17]
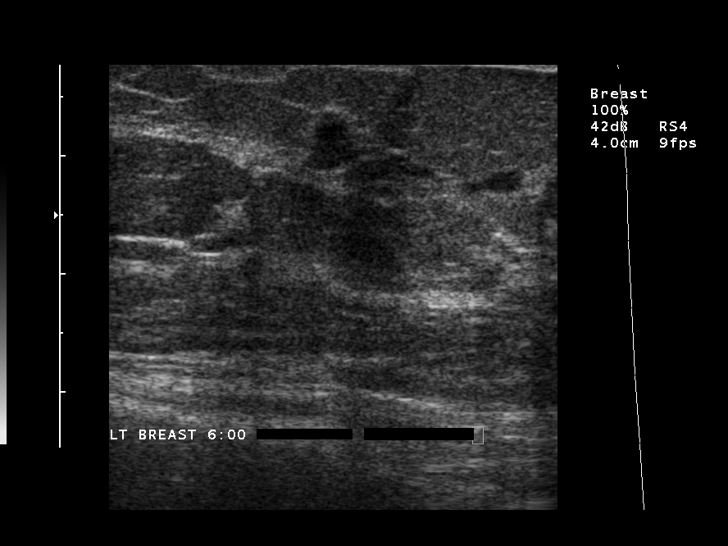
[im 16/17]
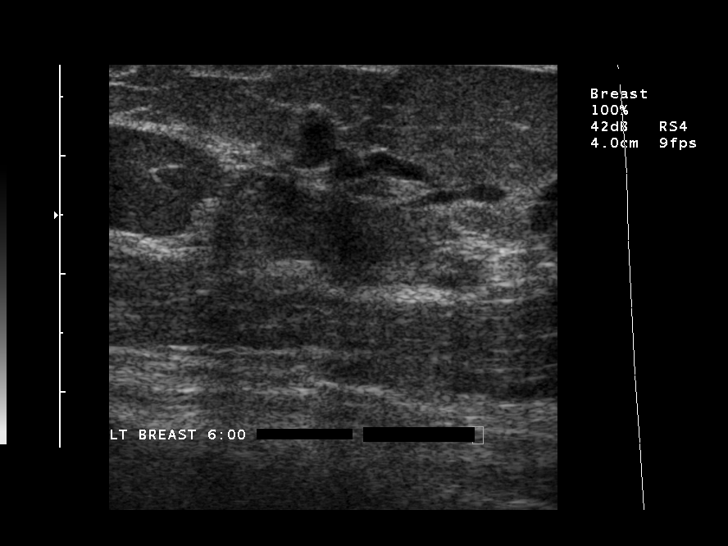
[im 17/17]
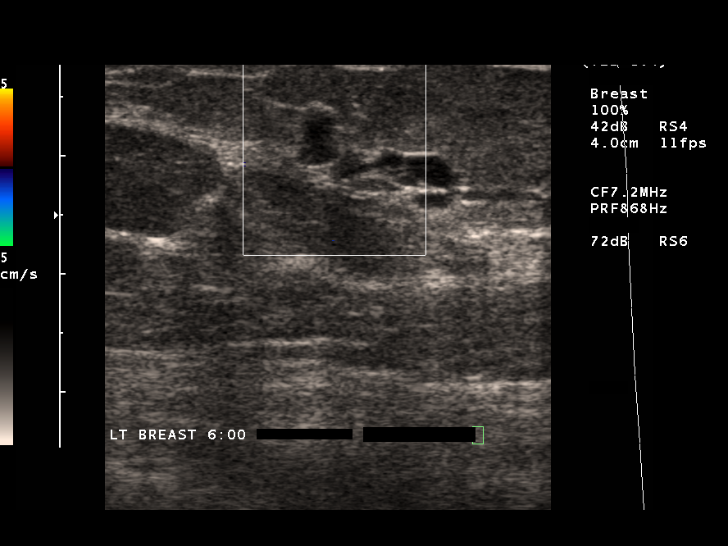

[13 of 17 positions shown; findings below may reference images not displayed]

Scattered fibroglandular tissue in each breast is unchanged.  There is continued mild nodularity 
bilaterally. The nodularity is slightly more prominent in the left breast, slightly laterally in 
the region of the recent palpable abnormality.  There are no findings on the right suspicious for 
malignancy.

Physical examination of the left breast confirmed a small area of palpable nodularity in the 
superior aspect of the breast slightly laterally. Sonographic examination of this area demonstrated
normal-appearing breast tissue. In the 1:30 o'clock subareolar region, a 10 x 8 x 5 mm oval, 
smoothly marginated, horizontally oriented hypoechoic structure is demonstrated containing a thin 
internal septation.  This exhibited mild increased through transmission of sound and has no 
internal blood flow with color Doppler.

In the 6 o'clock subareolar region, a dilated duct was demonstrated with a prominent anterior 
extension measuring 5 x 5 x 3 mm in maximum dimensions.  This also did not contain any internal 
blood flow.
IMPRESSION: Probable mildly complex cyst in the 1:30 o'clock subareolar region of the left breast and dilated 
duct in the 6 o'clock subareolar region of the left breast, most likely representing duct ectasia, 
with no associated visible mass at this time. A follow-up left breast ultrasound is recommended in 
six months to assess stability of these two findings.  This has been discussed with the patient.

ASSESSMENT: Probably benign - BI-RADS 3

Ultrasound of the left breast in 6 months.
,

## 2008-08-17 IMAGING — CR DG CHEST 1V PORT
1 series · 1 of 1 positions shown · non-contrast
Comparison: 07/30/2007

CLINICAL DATA: Chest pain

PORTABLE CHEST - 1 VIEW

[view not recorded]
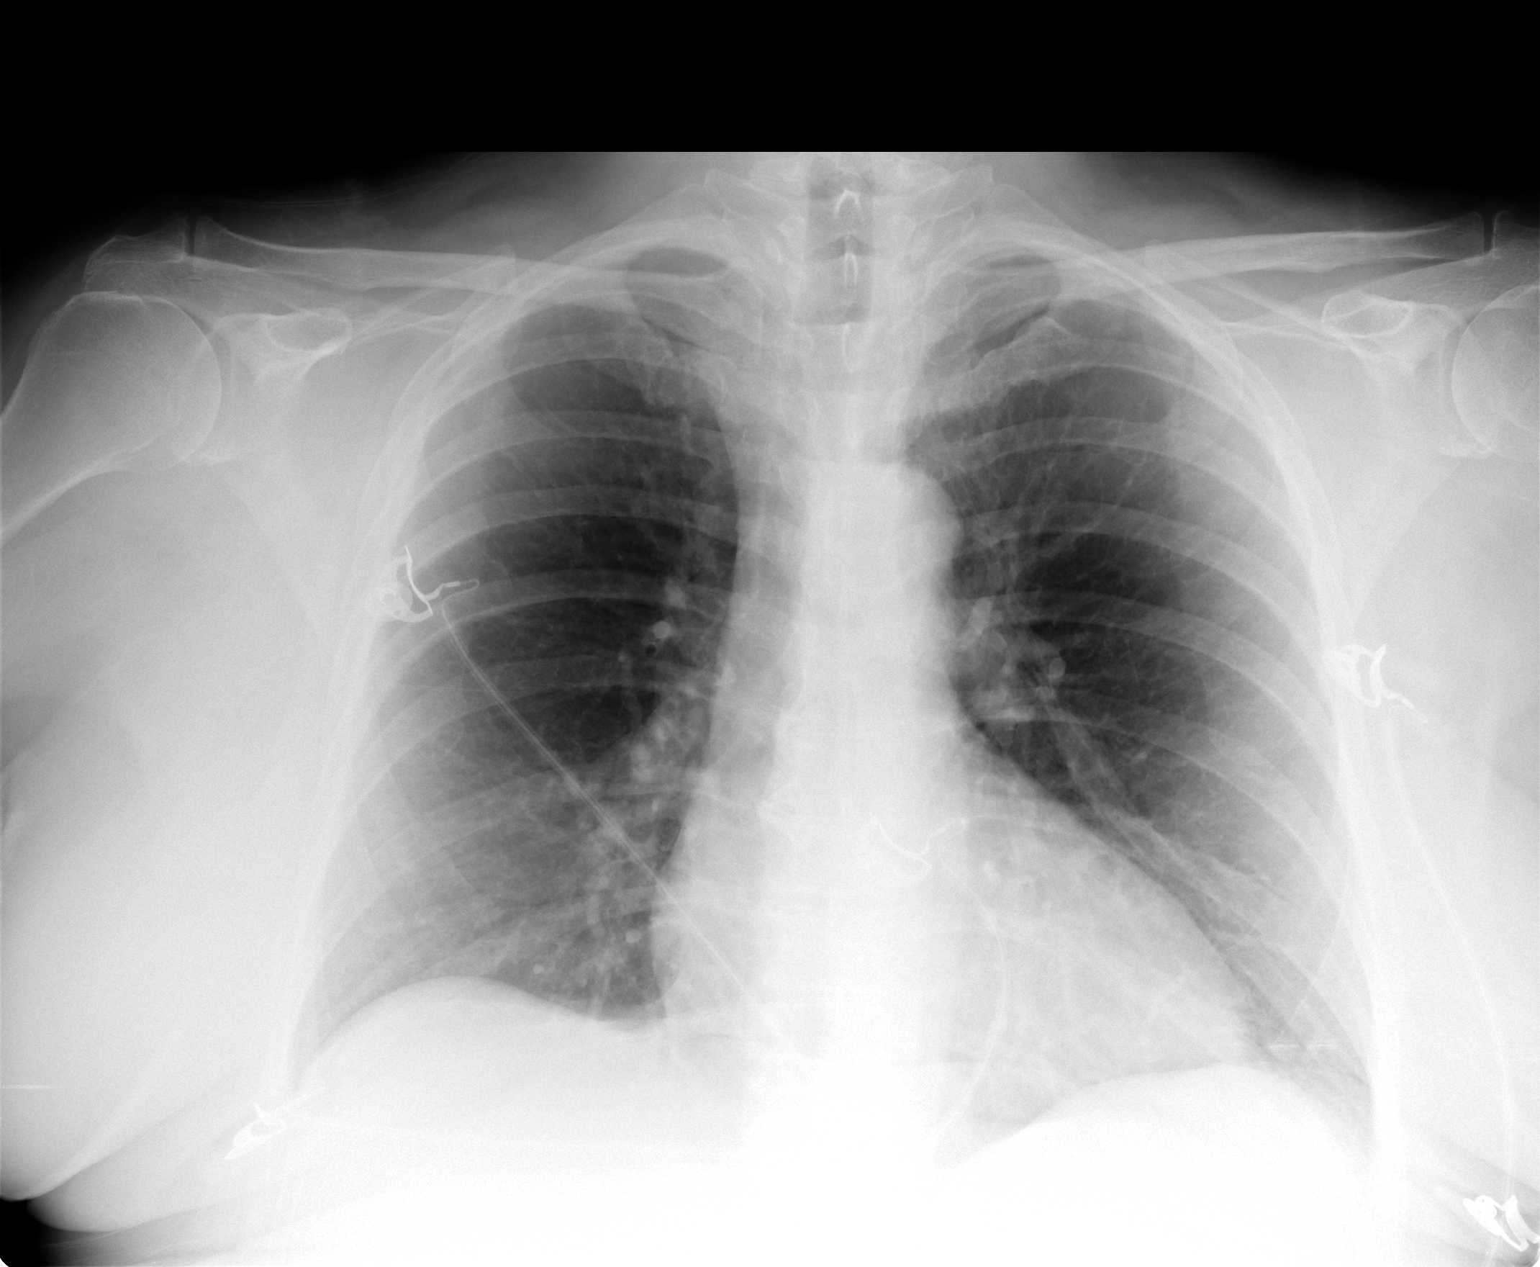

[1 of 1 positions shown; findings below may reference images not displayed]

FINDINGS: Heart size upper limits of normal for projection.
Mediastinal contours normal.  Trachea midline.  No airspace disease
or effusion.  Subsegmental atelectasis over the left hemidiaphragm.
IMPRESSION: No active cardiopulmonary disease and no change from prior.

## 2008-10-23 ENCOUNTER — Ambulatory Visit (HOSPITAL_COMMUNITY): Admission: RE | Admit: 2008-10-23 | Discharge: 2008-10-23 | Payer: Self-pay | Admitting: Cardiology

## 2008-11-01 IMAGING — US US BREAST*L*
1 series · 9 of 9 positions shown · non-contrast
Comparison: Prior studies

On physical exam, there is no discrete palpable abnormality.

CLINICAL DATA: 6-month reevaluation of probably benign left breast
nodule

LEFT BREAST ULTRASOUND

[Series 1: us breast*left* · 0.07mm/px · 9 of 9 slices shown]
[im 1/9]
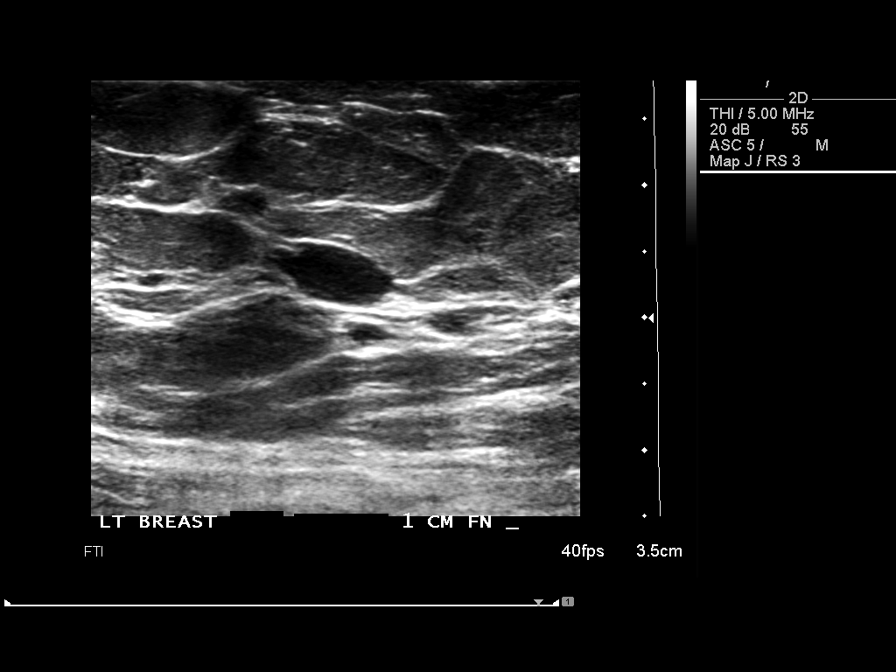
[im 2/9]
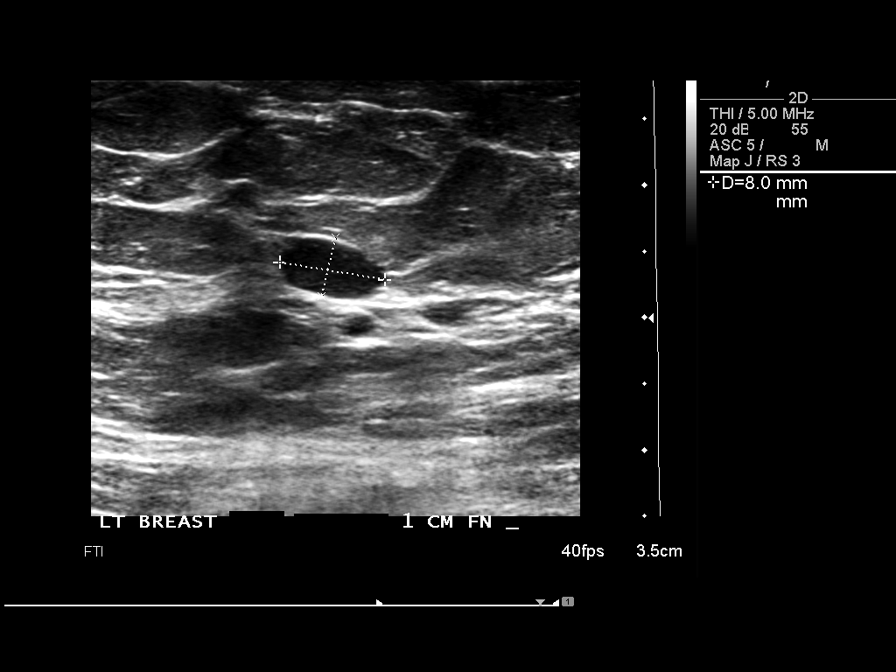
[im 3/9]
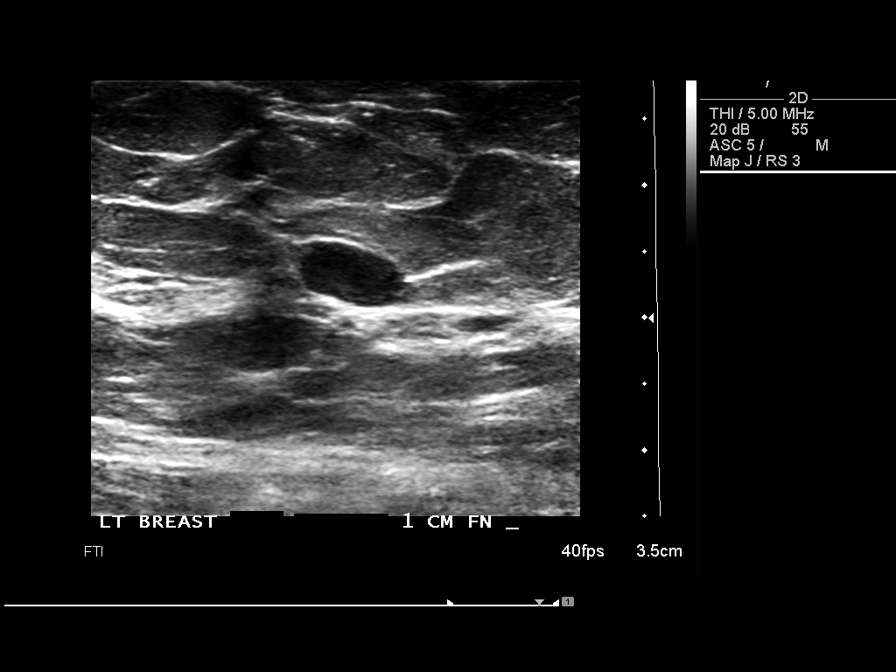
[im 4/9]
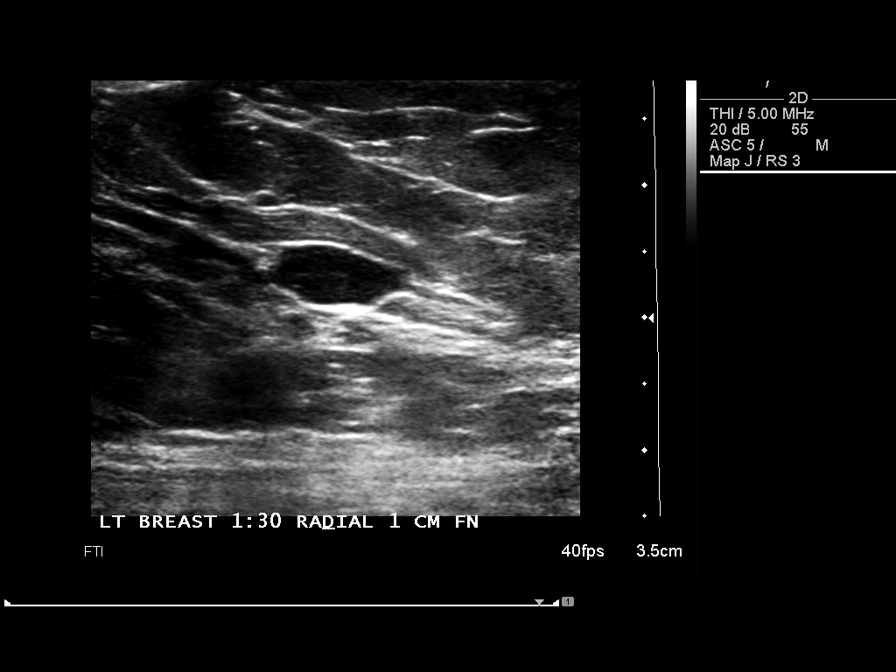
[im 5/9]
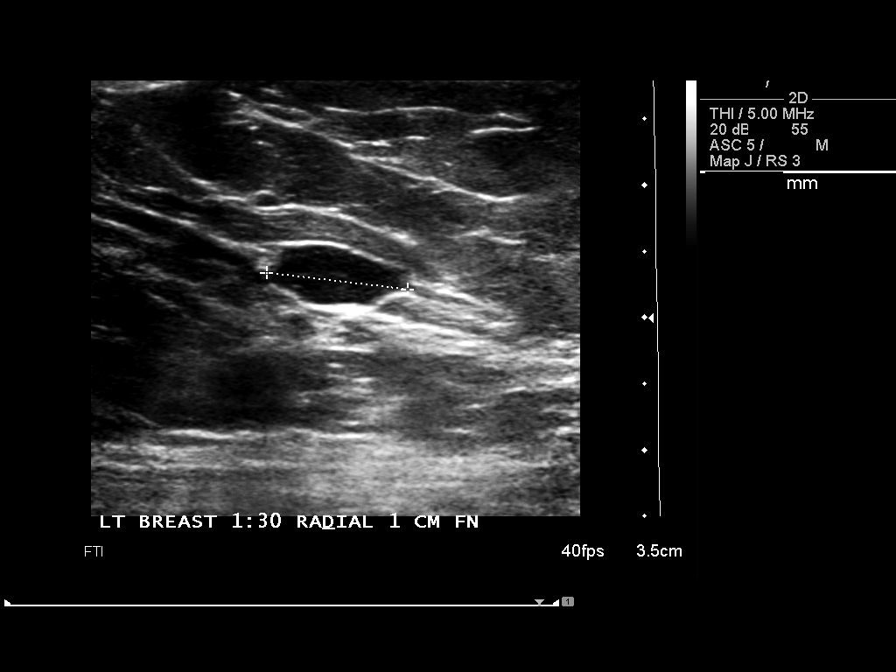
[im 6/9]
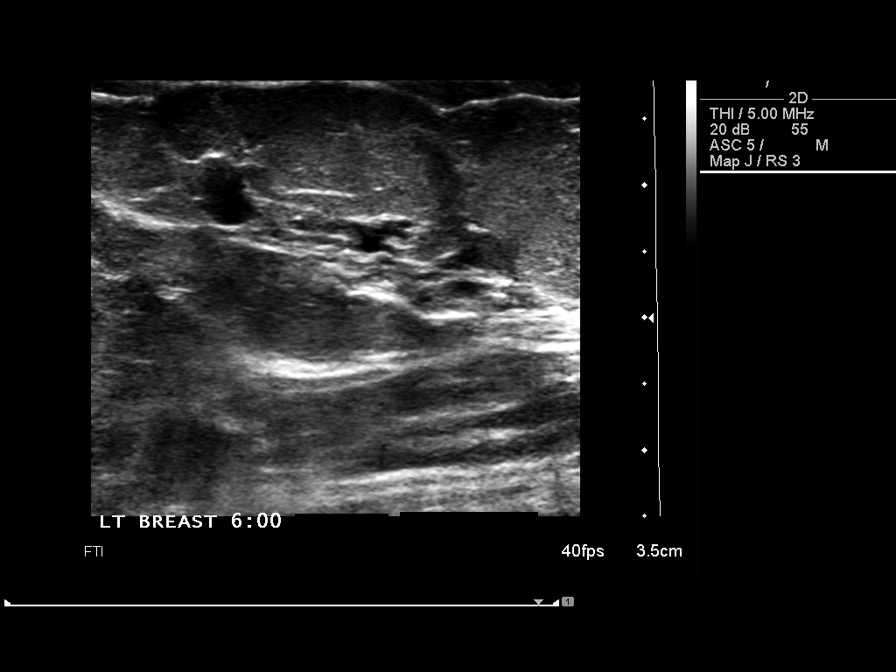
[im 7/9]
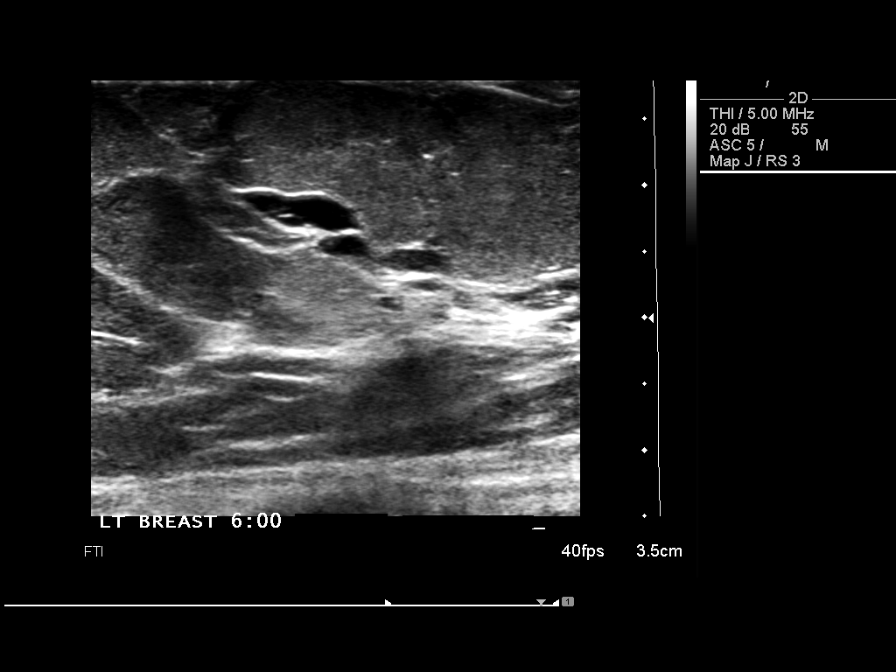
[im 8/9]
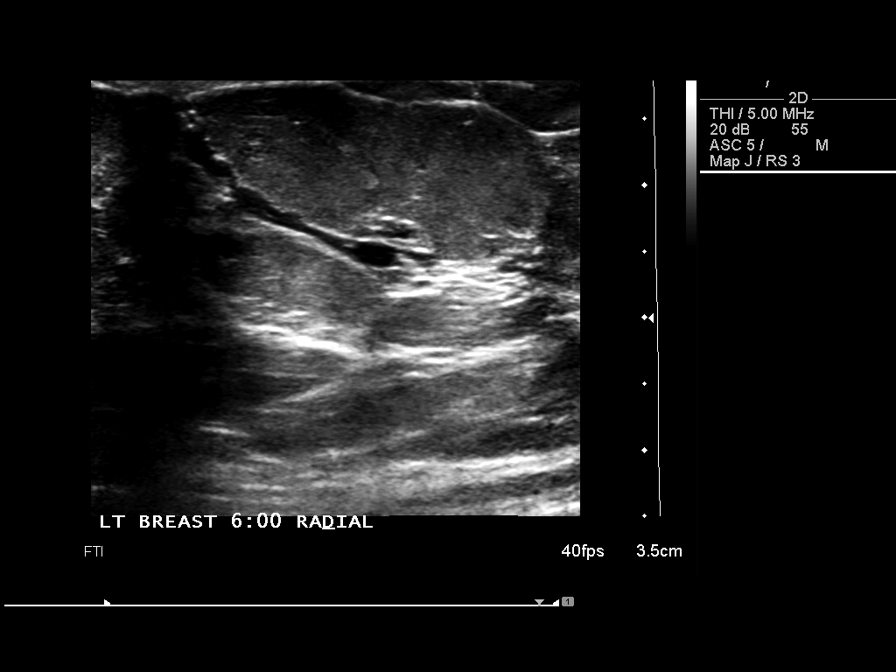
[im 9/9]
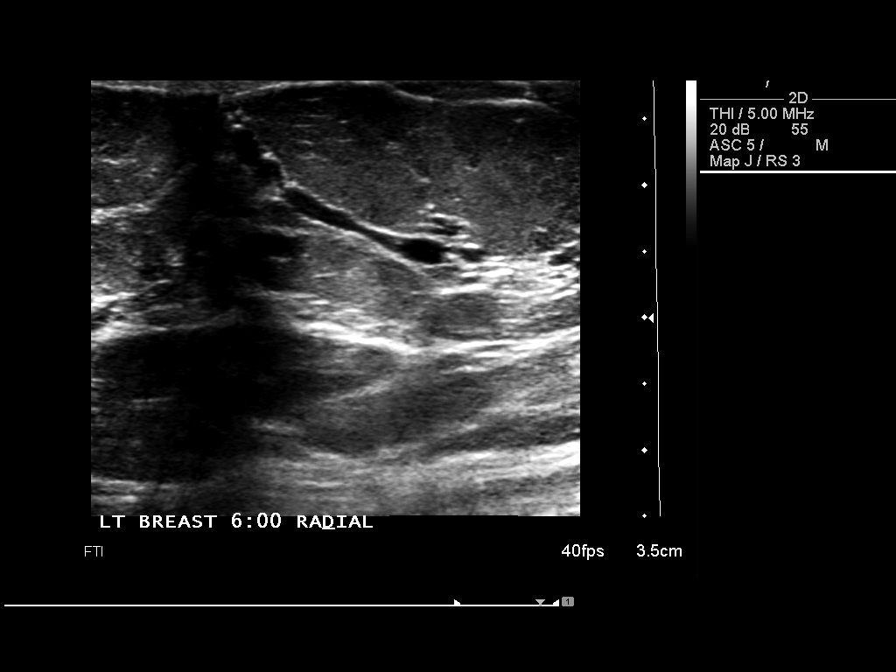

[9 of 9 positions shown; findings below may reference images not displayed]

FINDINGS: Ultrasound is performed, showing an oblong circumscribed
cyst located at the [DATE] position within the left breast 1 cm from
the nipple measuring 8 x 5 x 11 mm in size and containing mild low-
level internal echoes echoes.  This has not significantly changed
in size when compared with prior study.  Also the there is mild
duct ectasia noted within the left breast in the subareolar
proportion at the 6 o'clock position.  There is no evidence for
intraductal mass..
IMPRESSION: A stable benign cyst measuring 8 x 5 x 11 mm in size located [DATE]
position within the left breast.  Mild stable duct ectasia within
the left breast at the 6 o'clock subareolar region.  Recommend
follow-up bilateral screening mammography in 6 months.

BI-RADS CATEGORY 2:  Benign finding(s).

## 2008-11-28 ENCOUNTER — Ambulatory Visit (HOSPITAL_COMMUNITY): Admission: RE | Admit: 2008-11-28 | Discharge: 2008-11-28 | Payer: Self-pay | Admitting: Family Medicine

## 2009-05-16 IMAGING — CT CT ANGIO NECK
3 of 4 series · 11 of 33 positions shown · IV contrast (Omnipaque 300)
Comparison: None.

CLINICAL DATA: 67-year-old female with left carotid artery
stenosis.

CT ANGIOGRAPHY NECK
TECHNIQUE: Multidetector CT imaging of the neck was performed
using the standard protocol prior to and during bolus
administration of intravenous contrast. Multiplanar CT image
reconstructions including MIPs were obtained to evaluate the
vascular anatomy.  Carotid stenosis measurements (when applicable)
are obtained utilizing NASCET criteria, using the distal internal
carotid diameter as the denominator.
Contrast: 100 ml Omnipaque 350.

[Series 4: pre contrast (id) · axial · non-contrast · 0.35mm/px · z∈[+52,+184]mm · 4 of 74 slices shown]
[im 15/74  soft-tissue]
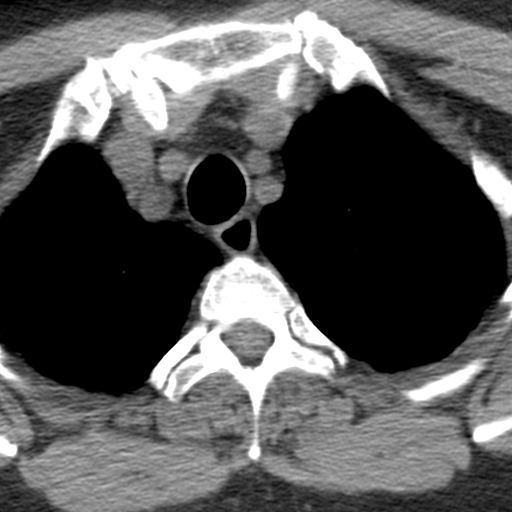
[im 30/74  soft-tissue]
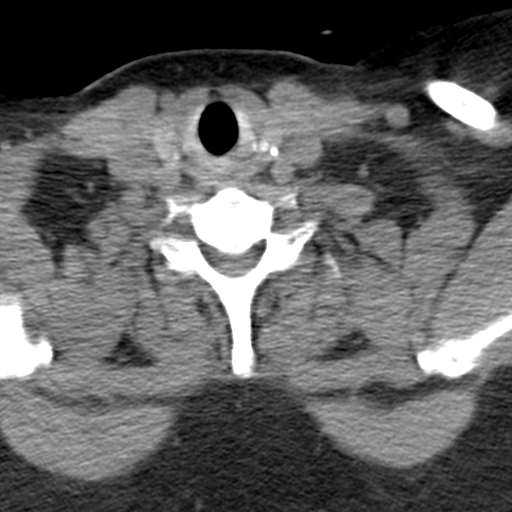
[im 44/74  soft-tissue]
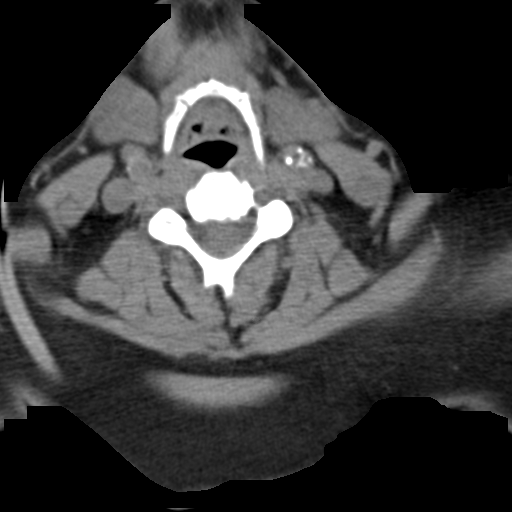
[im 59/74  soft-tissue]
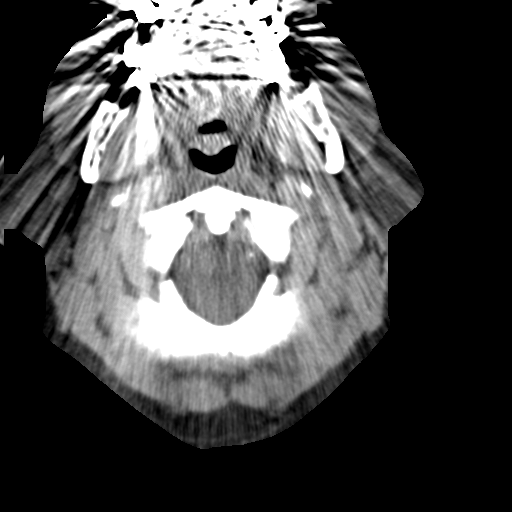

[Series 5: neck angio 2.0 b30f · axial · 0.34mm/px · z∈[+40,+198]mm · 6 of 111 slices shown]
[im 16/111  soft-tissue]
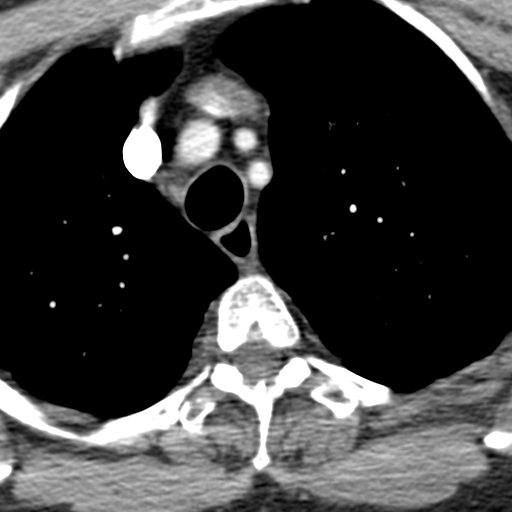
[im 32/111  bone]
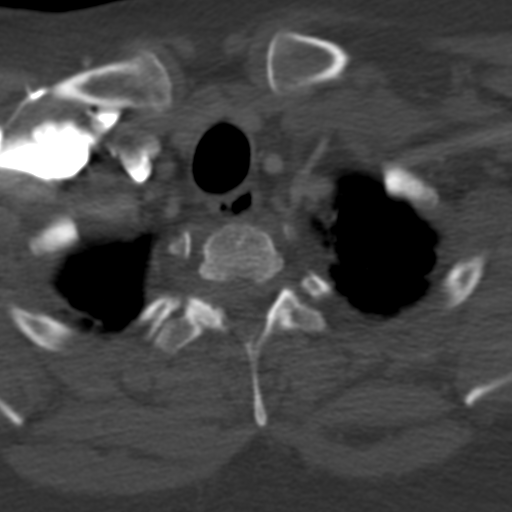
[im 48/111  soft-tissue]
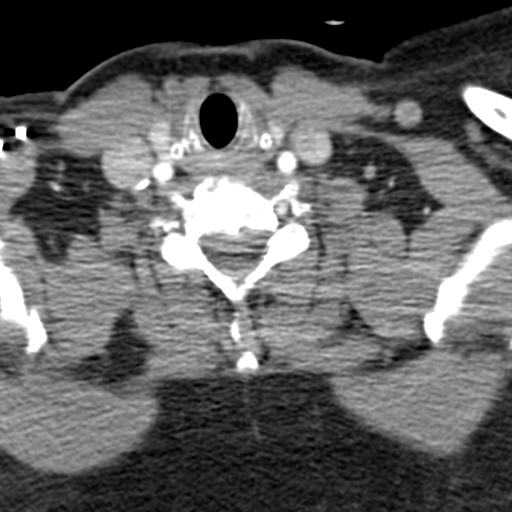
[im 63/111  bone]
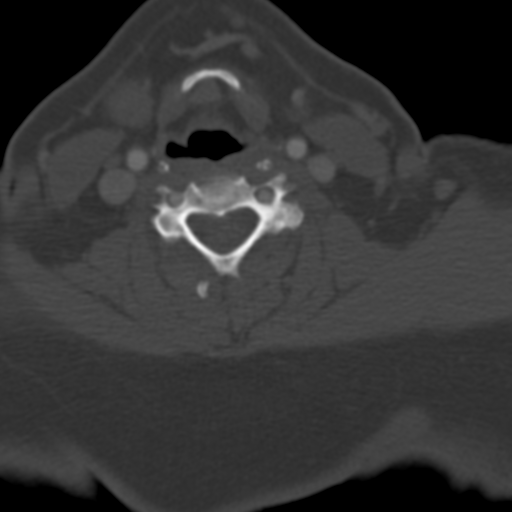
[im 79/111  soft-tissue]
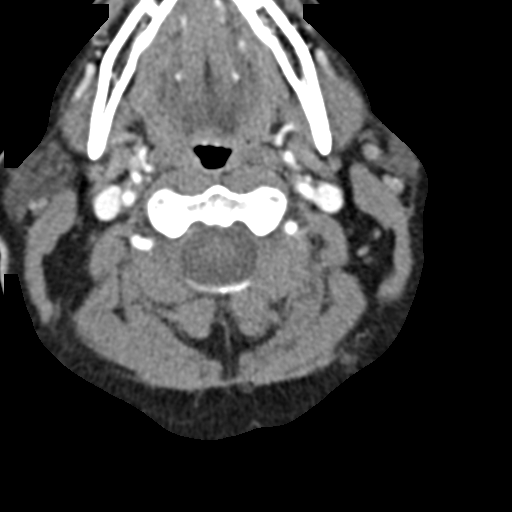
[im 95/111  bone]
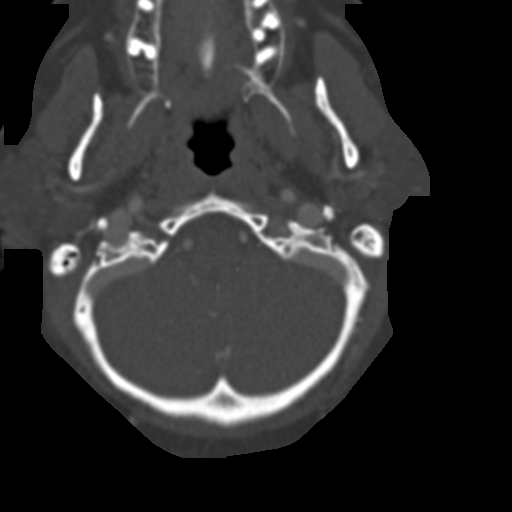

[Series 7: mpr sag post contrast · sagittal · 0.27mm/px · 1 of 79 slices shown]
[im 40/79  soft-tissue]
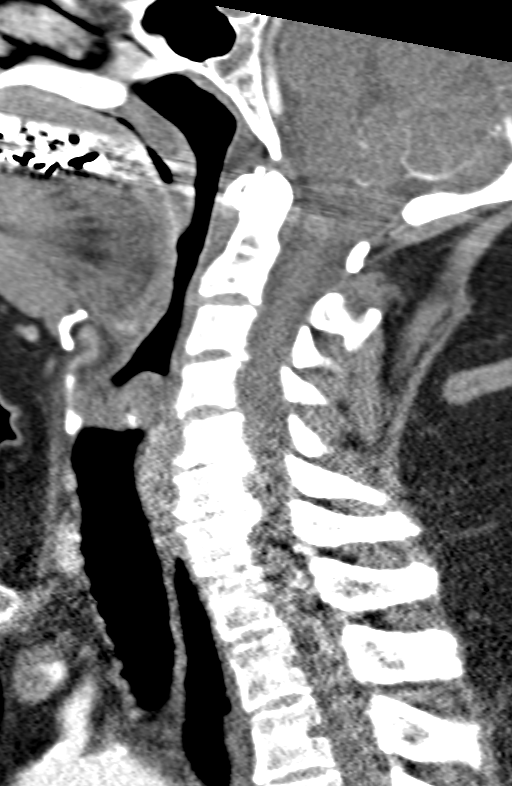

[11 of 33 positions shown; findings below may reference images not displayed]

FINDINGS: Visualized lung apices are clear.  There is a 5 mm
hypodense nodule the left thyroid lobe.  Pharyngeal mucosal spaces
are within normal limits.  Retropharyngeal, parapharyngeal,
visualized sublingual spaces, parotid and submandibular glands are
within normal limits.  No cervical lymphadenopathy.  Advanced
degenerative changes in the cervical spine especially at C4-C5, C5-
C6 and C6-C7 including trace anterolisthesis of C4 on C5.  Advanced
facet degeneration on the right at C2-C3.  Vacuum disc phenomena at
C2-C3. No acute osseous abnormality identified.  Opacification of
much of the left mastoid air cells, otherwise visualized paranasal
sinuses and mastoids are clear.  Visualized brain parenchyma is
within normal limits.

Vascular Findings: Major venous structures in the head and neck are
within normal limits; there is no venous contrast at the thoracic
inlet or in the chest.

A three-vessel arch configuration with calcified atherosclerosis of
the great vessel origins, but no hemodynamically significant
stenosis of the.  Right common carotid artery origin is within
normal limits.

Bilateral vertebral artery origins are within normal limits aside
from tortuosity.  The vertebral arteries are relatively codominant.
There within normal limits to the C1 level.  The left V4 segment is
remarkable for calcified atherosclerosis, mild stenosis results.
The vertebrobasilar junction and visualized basilar artery are
within normal limits.

The right common carotid artery is within normal limits.  At the
right carotid bifurcation there is scant soft and calcified plaque
involving the right ICA bulb and proximal right ICA resulting in
left and 25 % stenosis with respect to the distal vessel. The
cervical right ICA is then mildly tortuous and otherwise normal to
the skull base.  The visualized right ICA siphon is remarkable for
calcified atherosclerosis in the cavernous segment, incompletely
visualized.

The left common carotid artery beyond its origin is within normal
limits to the carotid bifurcation.  At the bifurcation abundant
soft plaque occurs at the left ICA origin which is irregular.
Subsequently, there is 30-40 % stenosis calculated with respect to
the distal vesselat the left ICA origin.  The left ICA bulb is
irregular.  The cervical left ICA is then mildly tortuous, but
otherwise within normal limits to the skull base.  The visualized
left ICA siphon is remarkable for calcified atherosclerosis in the
cavernous segment as seen on the right, incompletely visualized.
IMPRESSION: 1.  Abundant soft and calcified atherosclerotic plaque at the left
carotid bifurcation involving the left ICA origin with a calculated
stenosis of 30-40 % with respect to the distal vessel.
2.  Minor atherosclerosis at the right carotid bifurcation with
less than 25 % stenosis with respect to the distal vesselof the
right ICA origin.
3. Atherosclerosis of the distal left vertebral artery with mild
stenosis.
4.  Subcentimeter hypodense left thyroid nodule.
5.  Advanced cervical spine degenerative changes.
6.  Left mastoid air cell inflammatory changes.

## 2009-08-17 ENCOUNTER — Emergency Department (HOSPITAL_COMMUNITY): Admission: EM | Admit: 2009-08-17 | Discharge: 2009-08-17 | Payer: Self-pay | Admitting: Emergency Medicine

## 2009-08-18 ENCOUNTER — Emergency Department (HOSPITAL_COMMUNITY): Admission: EM | Admit: 2009-08-18 | Discharge: 2009-08-18 | Payer: Self-pay | Admitting: Emergency Medicine

## 2009-08-21 ENCOUNTER — Ambulatory Visit (HOSPITAL_BASED_OUTPATIENT_CLINIC_OR_DEPARTMENT_OTHER): Admission: RE | Admit: 2009-08-21 | Discharge: 2009-08-21 | Payer: Self-pay | Admitting: Orthopedic Surgery

## 2009-09-14 ENCOUNTER — Ambulatory Visit (HOSPITAL_COMMUNITY): Admission: RE | Admit: 2009-09-14 | Discharge: 2009-09-14 | Payer: Self-pay | Admitting: Orthopedic Surgery

## 2009-09-24 ENCOUNTER — Ambulatory Visit (HOSPITAL_BASED_OUTPATIENT_CLINIC_OR_DEPARTMENT_OTHER): Admission: RE | Admit: 2009-09-24 | Discharge: 2009-09-24 | Payer: Self-pay | Admitting: Orthopedic Surgery

## 2010-01-01 ENCOUNTER — Ambulatory Visit (HOSPITAL_COMMUNITY): Admission: RE | Admit: 2010-01-01 | Discharge: 2010-01-01 | Payer: Self-pay | Admitting: Family Medicine

## 2010-01-01 ENCOUNTER — Ambulatory Visit (HOSPITAL_COMMUNITY): Admission: RE | Admit: 2010-01-01 | Discharge: 2010-01-01 | Payer: Self-pay | Admitting: Endocrinology

## 2010-02-15 ENCOUNTER — Encounter (HOSPITAL_COMMUNITY): Admission: RE | Admit: 2010-02-15 | Discharge: 2010-03-17 | Payer: Self-pay | Admitting: Cardiology

## 2010-03-10 IMAGING — CR DG WRIST 2V*R*
2 series · 2 of 2 positions shown · non-contrast
Comparison: None

CLINICAL DATA: Fall.  Wrist fracture deformity and pain.

RIGHT WRIST - 2 VIEW

[view not recorded (1 of 2)]
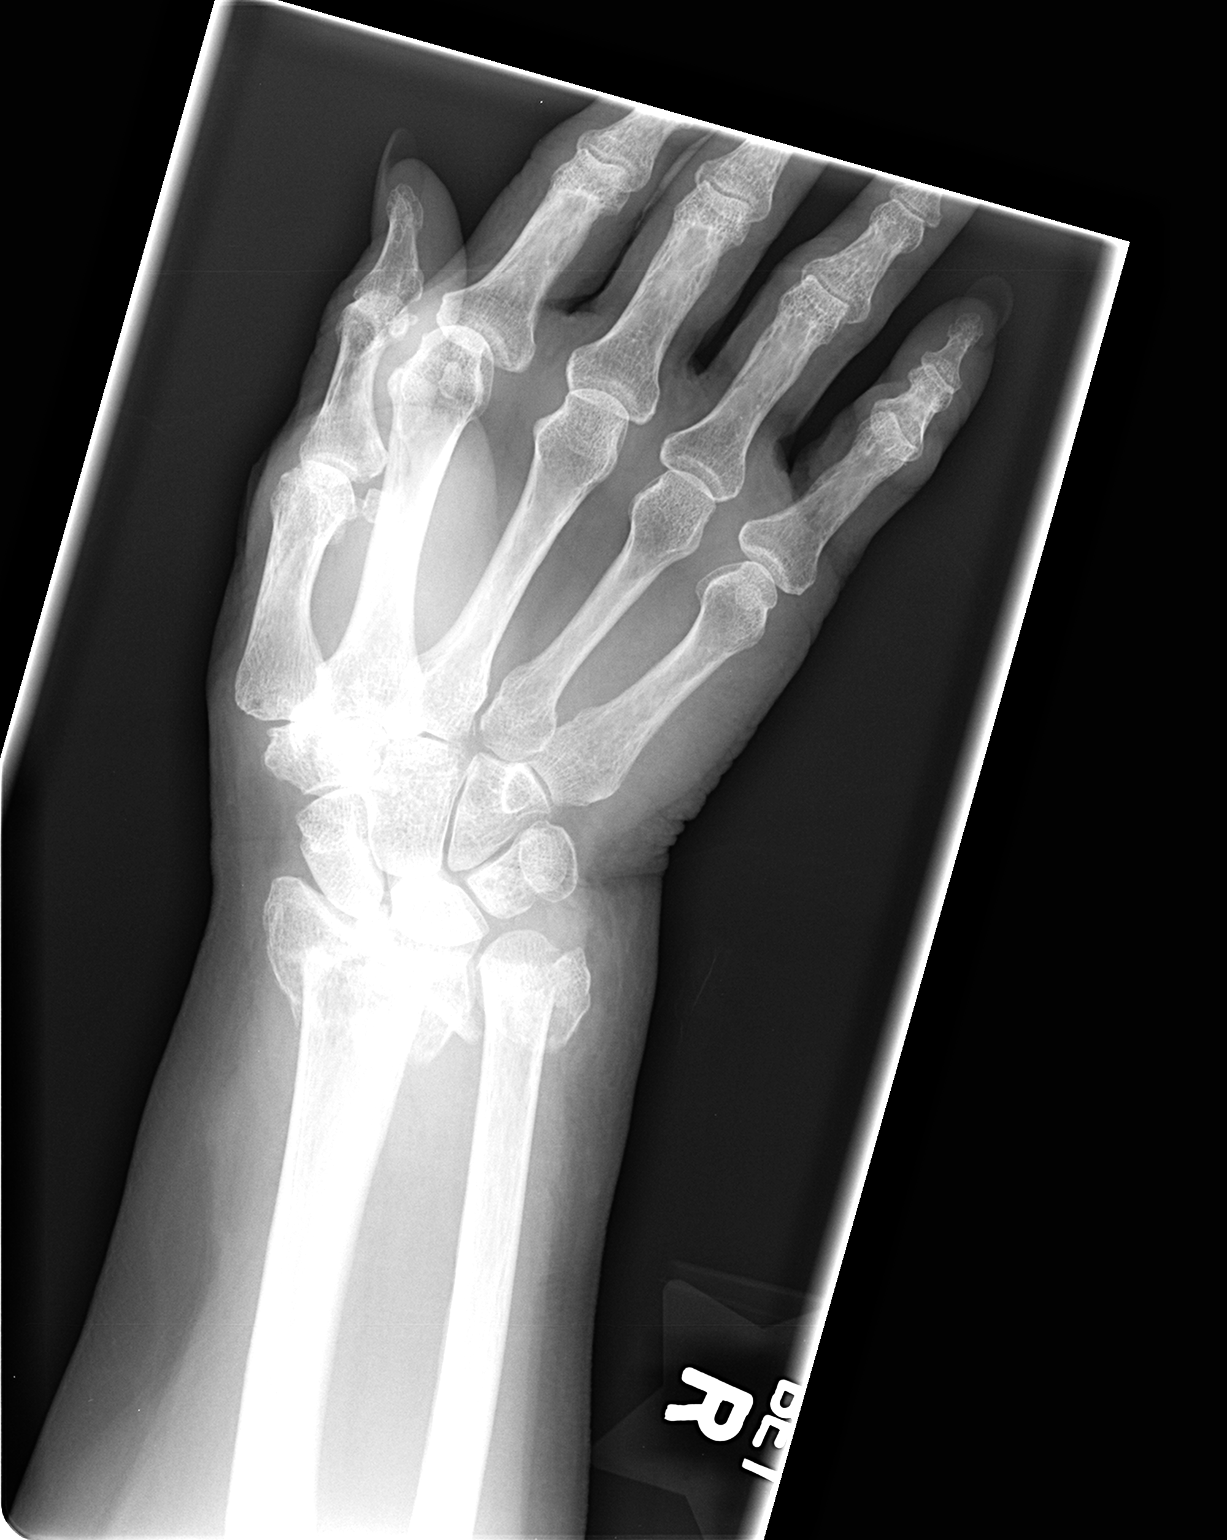

[view not recorded (2 of 2)]
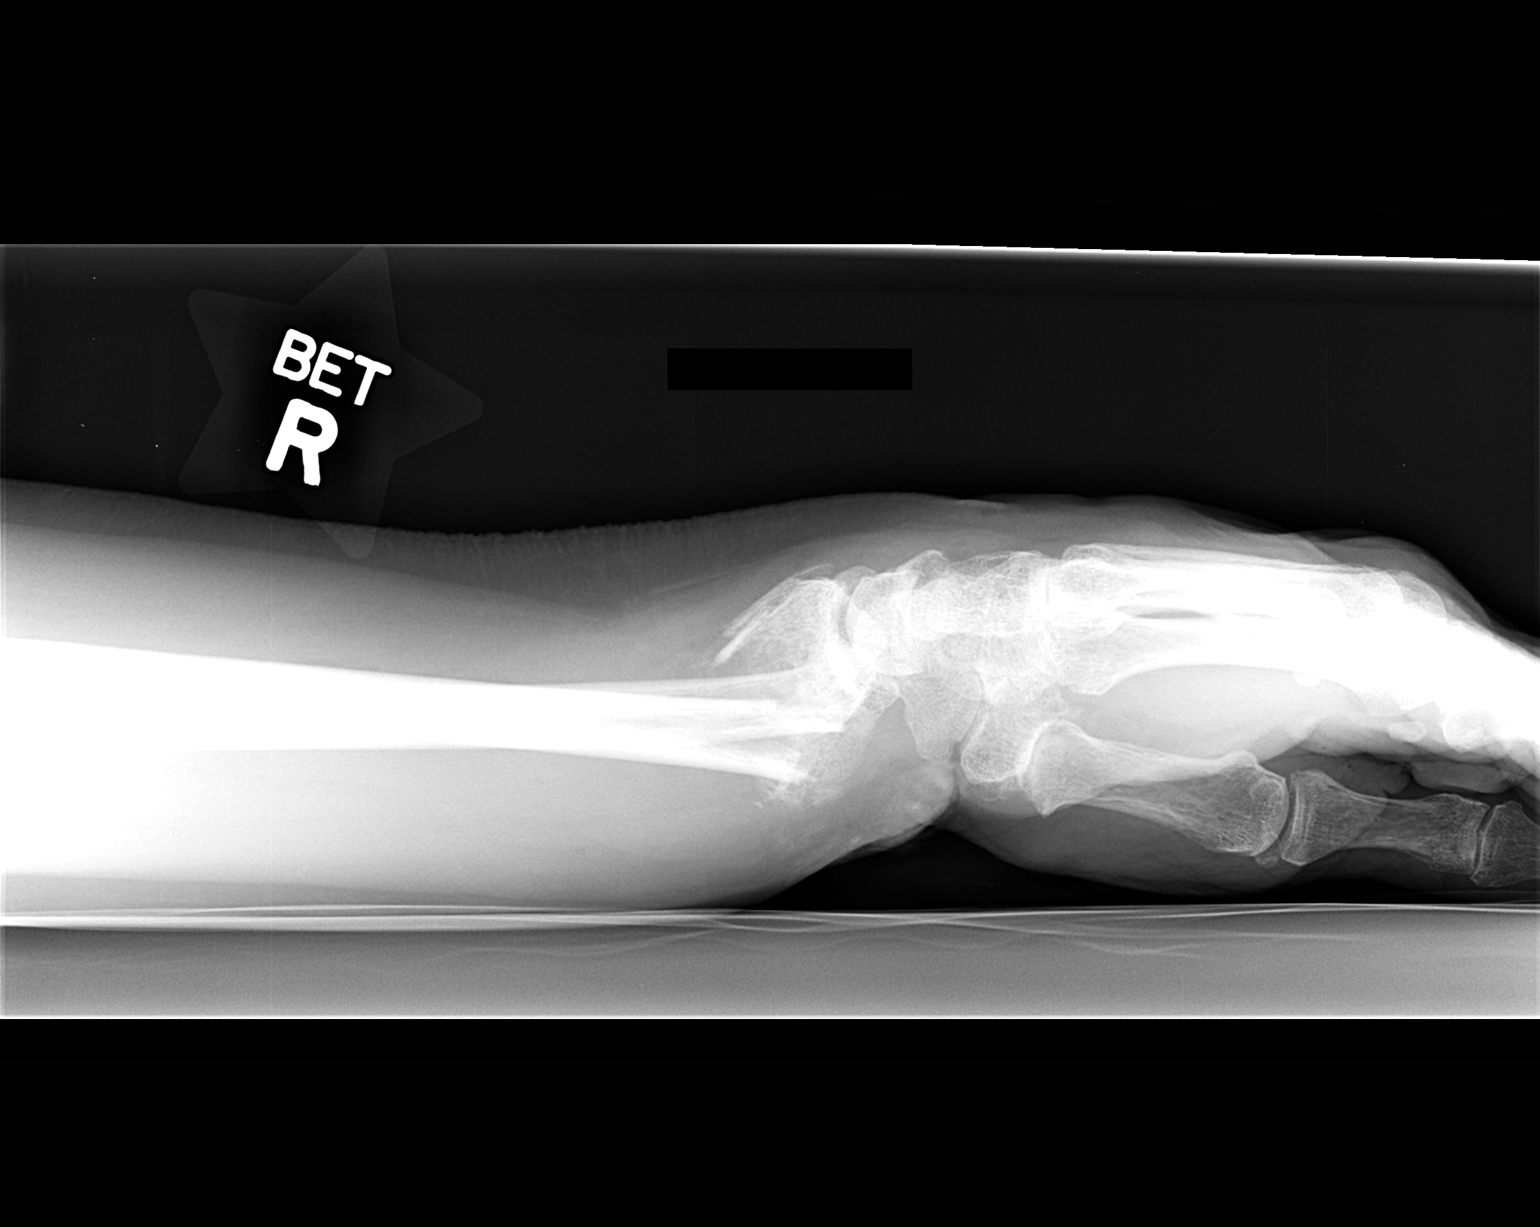

[2 of 2 positions shown; findings below may reference images not displayed]

FINDINGS: Comminuted and impacted fractures of the distal radius
and ulna are seen, with involvement of both radiocarpal and distal
radial ulnar joints.  There is dorsal displacement and angulation
of the the distal fracture fragments, including the articular
surface of the radius.  There is no evidence of dislocation.
Carpal bones are normal alignment.
IMPRESSION: Comminuted, impacted fractures of the distal radius and ulna, with
dorsal displacement and angulation.

## 2010-03-10 IMAGING — CT CT HEAD W/O CM
4 of 5 series · 13 of 47 positions shown, 14 images · non-contrast
Comparison: None

CT HEAD

CLINICAL DATA: The patient fell

CT HEAD WITHOUT CONTRAST
CT CERVICAL SPINE WITHOUT CONTRAST
TECHNIQUE: Multidetector CT imaging of the head and cervical spine
was performed following the standard protocol without intravenous
contrast.  Multiplanar CT image reconstructions of the cervical
spine were also generated.

[Series 2: headseq 4.8 h37s · axial · 0.47mm/px · z∈[+206,+276]mm · 3 of 30 slices shown, 4 images]
[im 8/30  brain]
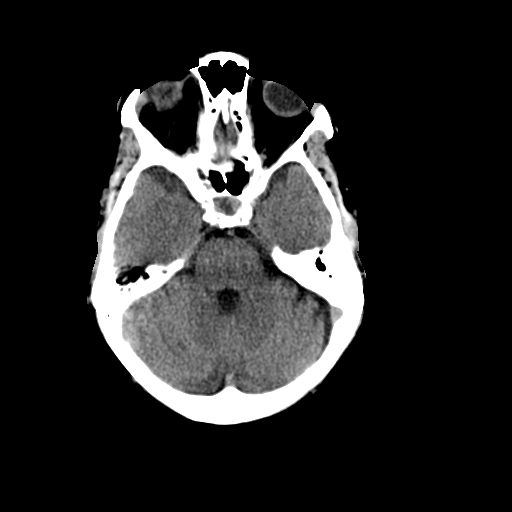
[im 8/30  bone]
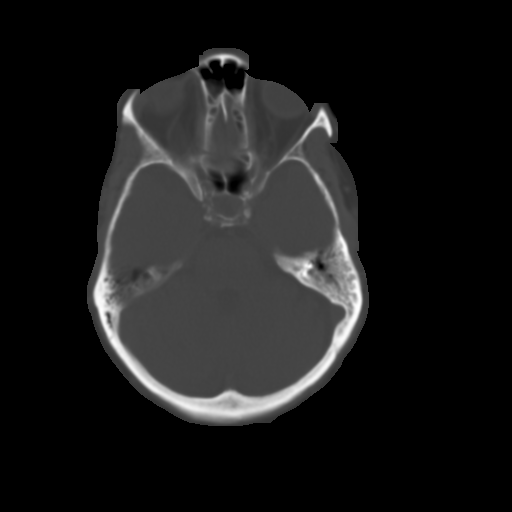
[im 15/30  brain]
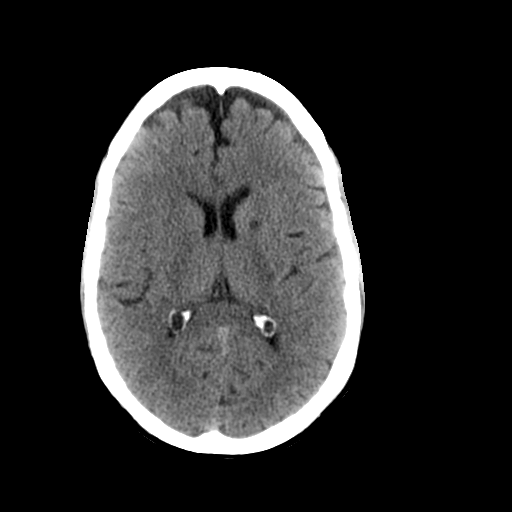
[im 22/30  brain]
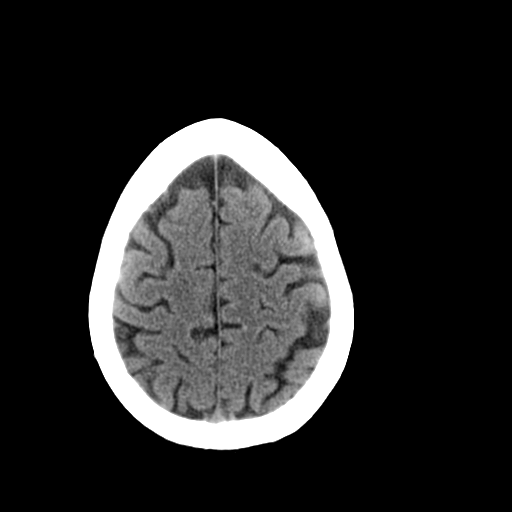

[Series 4: cervical st 2.0 b31s · axial · 0.26mm/px · z∈[+22,+78]mm · 4 of 86 slices shown]
[im 8/86  brain]
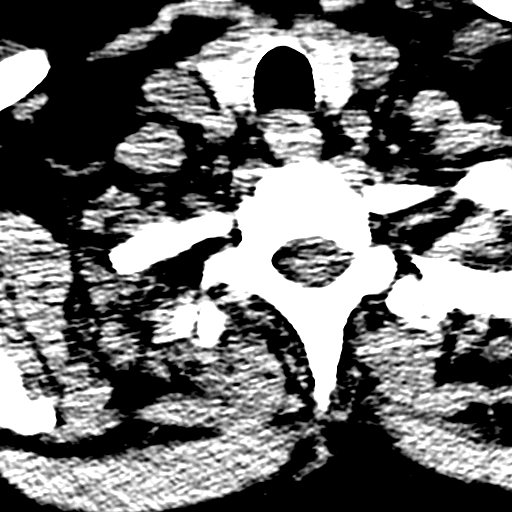
[im 22/86  brain]
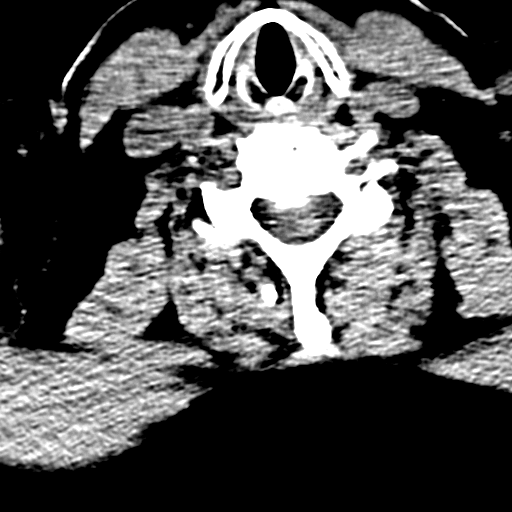
[im 29/86  brain]
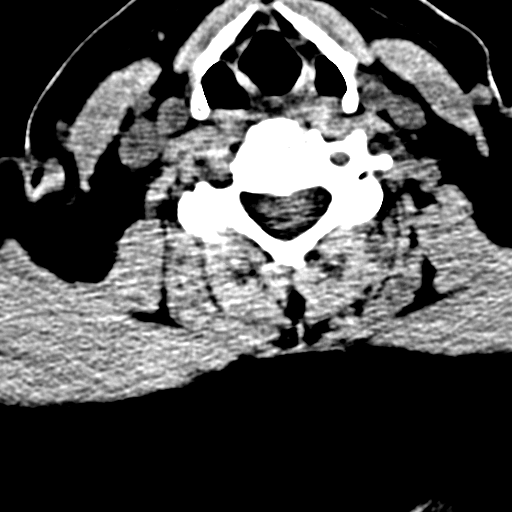
[im 36/86  brain]
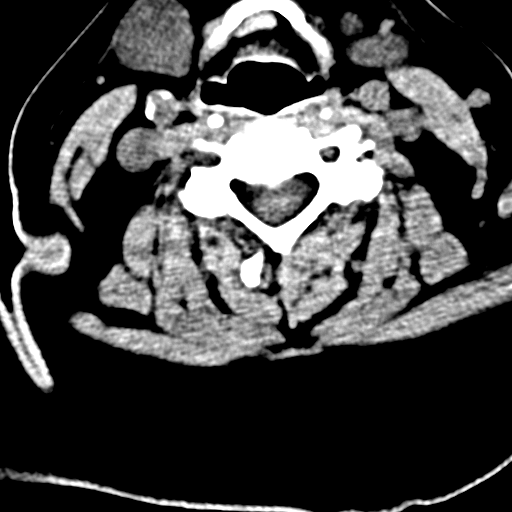

[Series 7: cervical coro (id) · coronal · 0.17mm/px · 3 of 41 slices shown]
[im 14/41  brain]
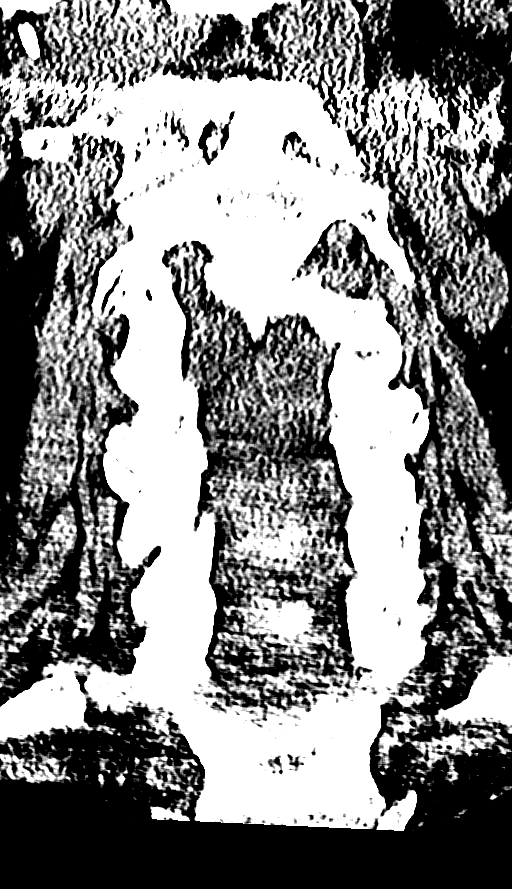
[im 18/41  brain]
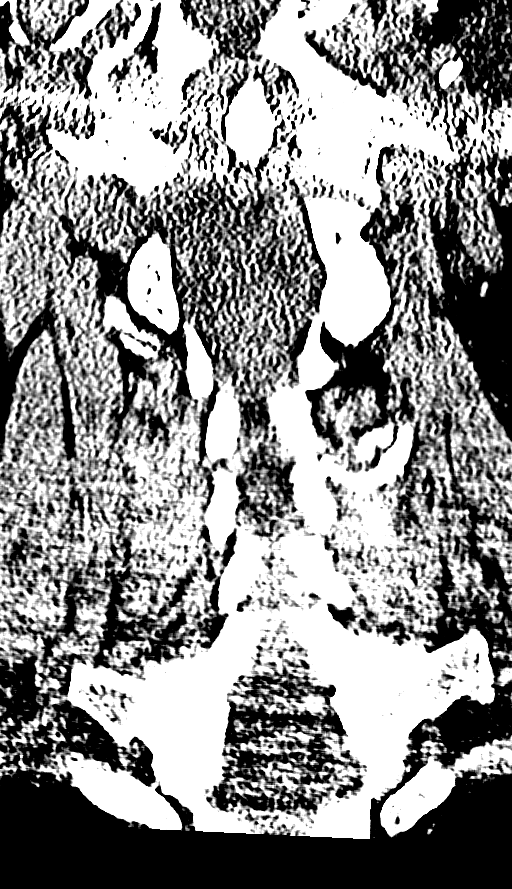
[im 23/41  brain]
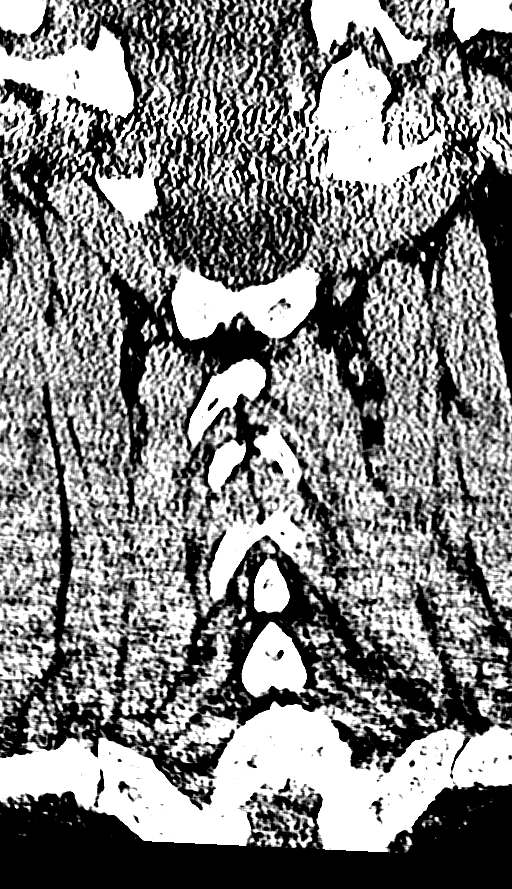

[Series 8: cervical sag (id) · sagittal · 0.17mm/px · 3 of 35 slices shown]
[im 12/35  brain]
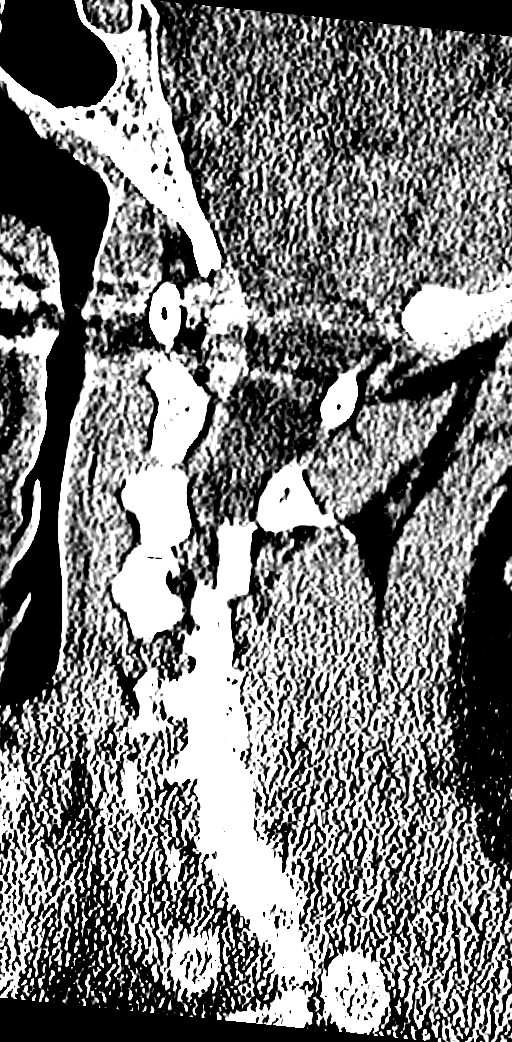
[im 18/35  brain]
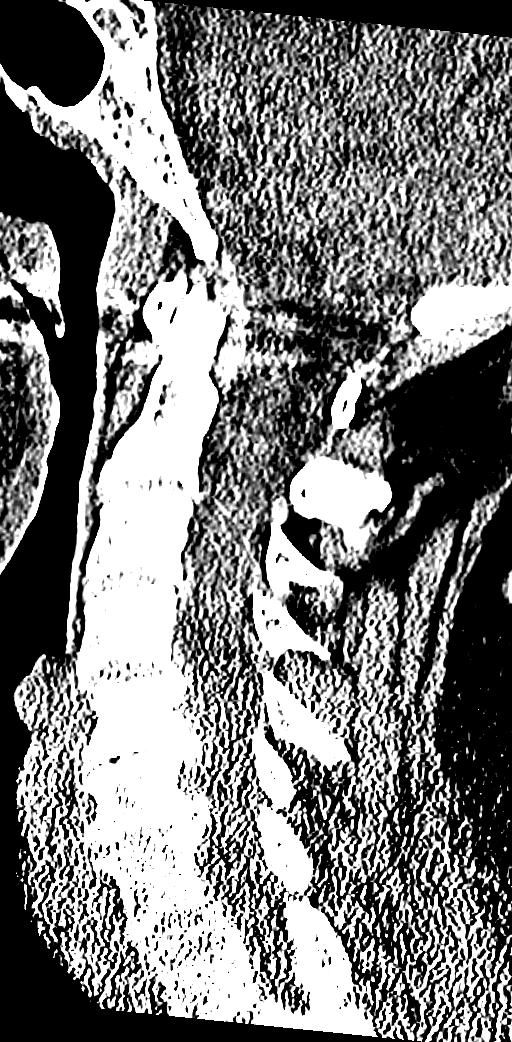
[im 23/35  brain]
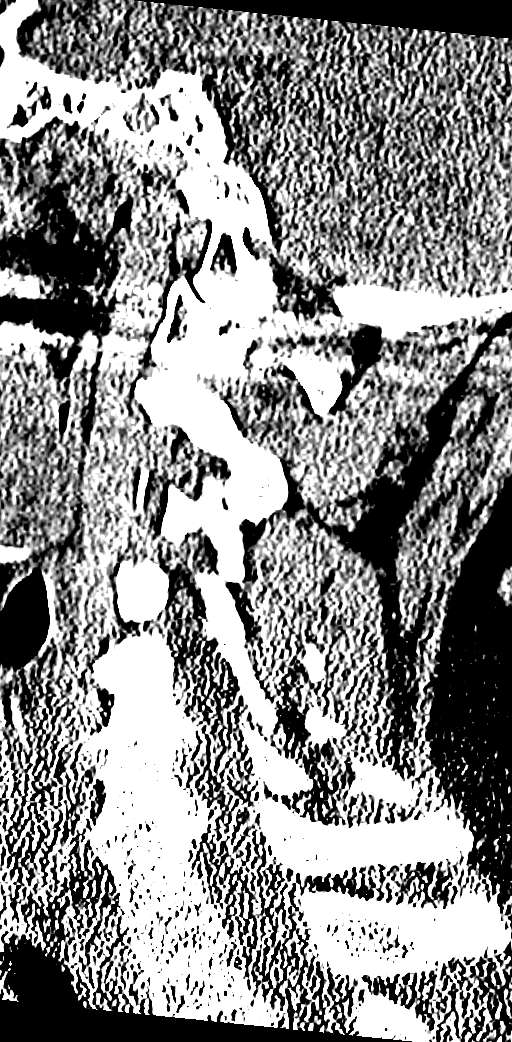

[13 of 47 positions shown; findings below may reference images not displayed]

FINDINGS: No acute bony calvarial abnormality.  No acute
intracranial findings.  Negative for hemorrhage, acute infarction,
or mass lesion.  Remote lacunar infarctions right linear formed
nucleus and anterior limb of the left internal capsule.
IMPRESSION: No acute intracranial abnormality.

CT CERVICAL SPINE
FINDINGS: No acute fracture or subluxation.  Spondylosis.
Degenerative facet arthropathy on the right at C2-3 and bilaterally
at C3-4 and C4-5 .  2.5 mm anterior subluxation of C4 on C5.  Disc
space narrowing and osteophytic formation at C5-6 and C6-7.
IMPRESSION: Spondylosis.  No acute fracture.  Mild anterior subluxation of C4
on C5.

## 2010-04-07 IMAGING — CT CT EXTREM UP W/O CM*R*
3 of 5 series · 11 of 36 positions shown, 12 images · non-contrast
Comparison: Plain films 08/17/2009

CLINICAL DATA: Distal radius and ulnar fractures.

CT RIGHT WRIST WITHOUT CONTRAST
TECHNIQUE: Multidetector CT imaging of the right wrist was
performed according to the standard protocol without intravenous
contrast. Multiplanar CT image reconstructions were also generated.

[Series 4: mpr cor bone st to pacs 2.0 · coronal · 0.14mm/px · 1 of 27 slices shown]
[im 14/27  bone]
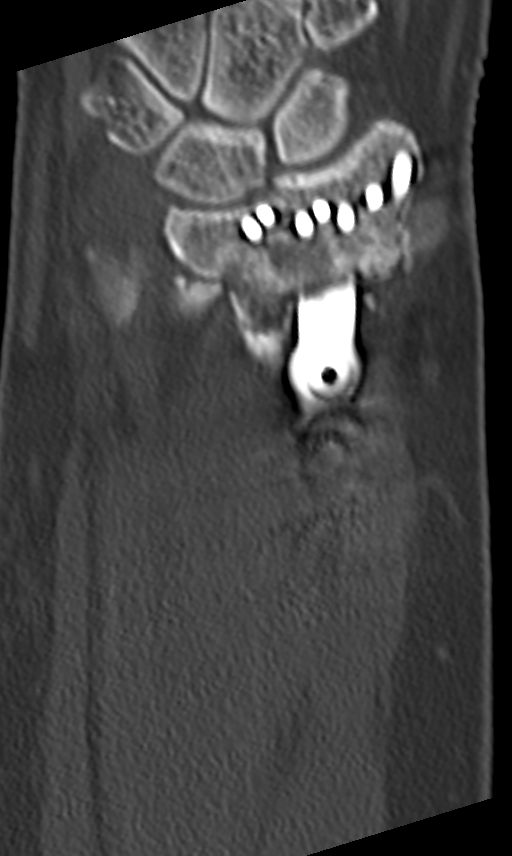

[Series 5: mpr sag st to pacs 2.0 · sagittal · 0.11mm/px · 6 of 37 slices shown]
[im 7/37  bone]
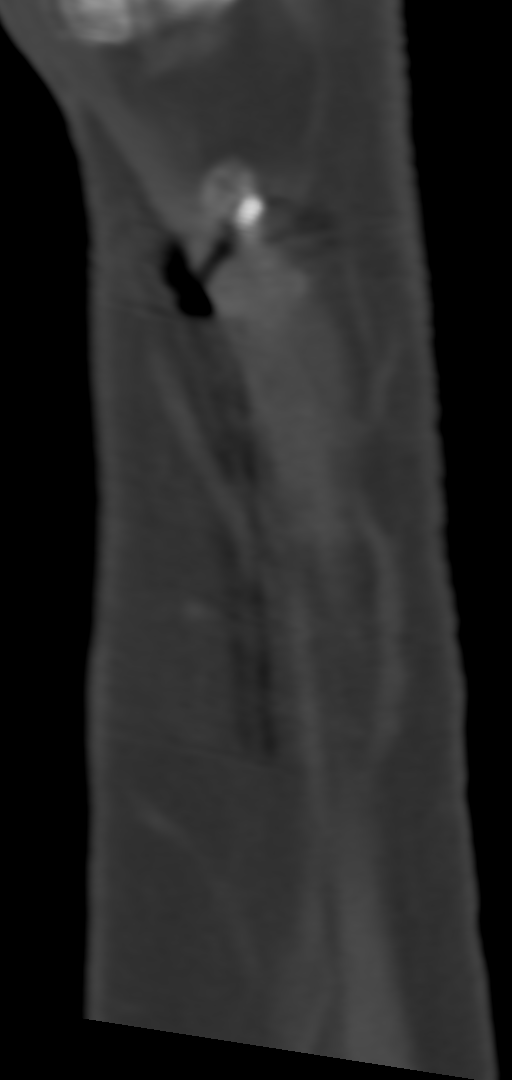
[im 9/37  soft-tissue]
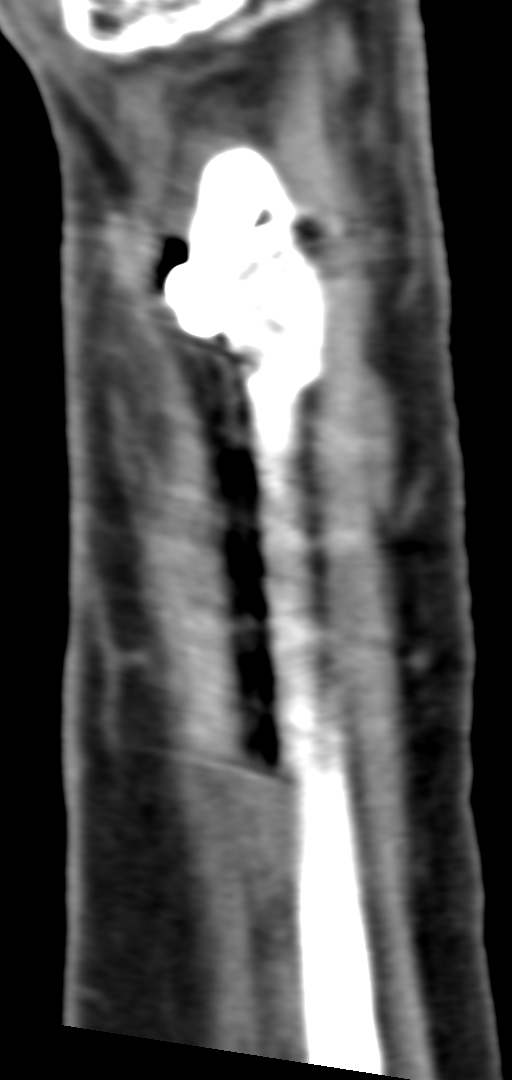
[im 13/37  bone]
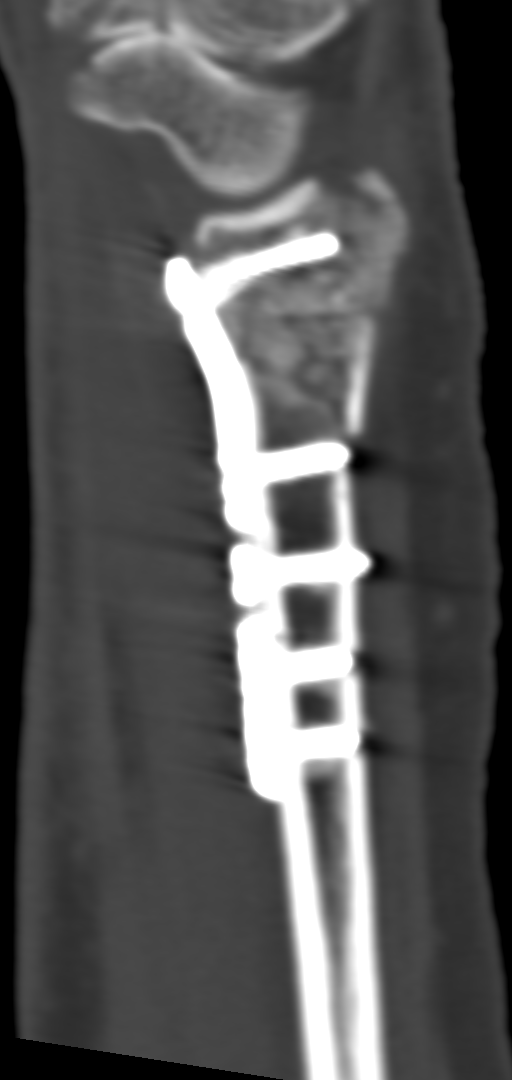
[im 19/37  bone]
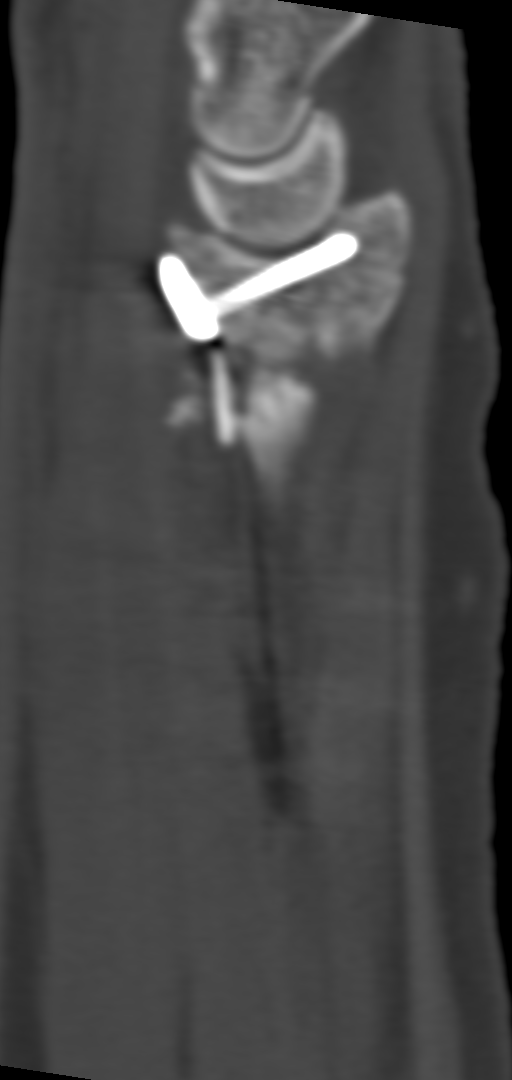
[im 25/37  bone]
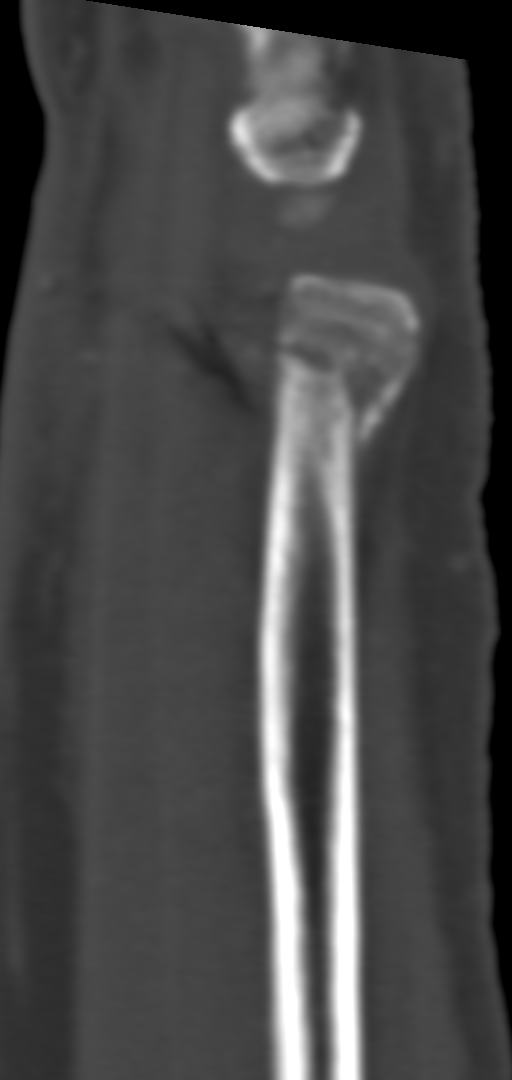
[im 31/37  bone]
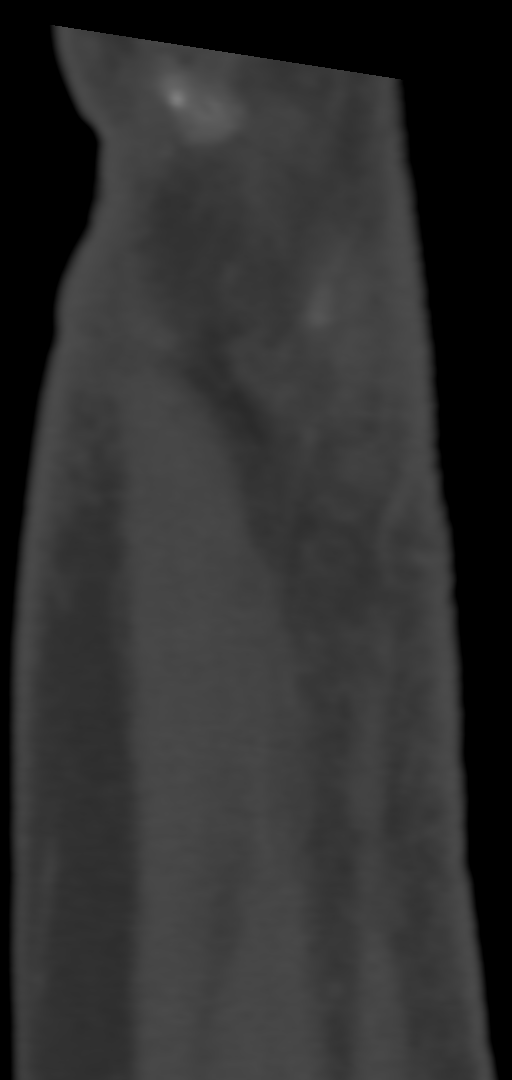

[Series 6: axial st to pacs 2.0 · axial · 0.23mm/px · z∈[+112,+192]mm · 4 of 58 slices shown, 5 images]
[im 9/58  soft-tissue]
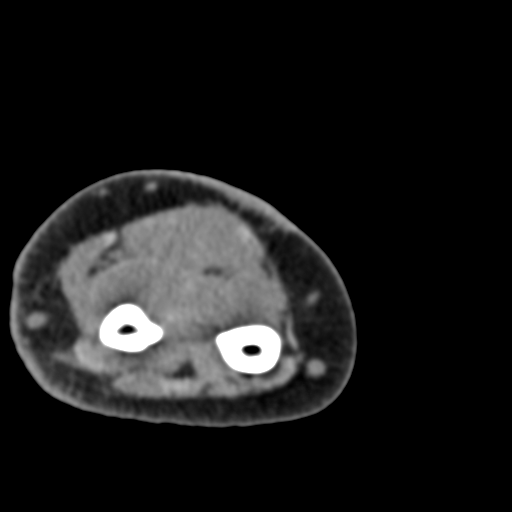
[im 9/58  bone]
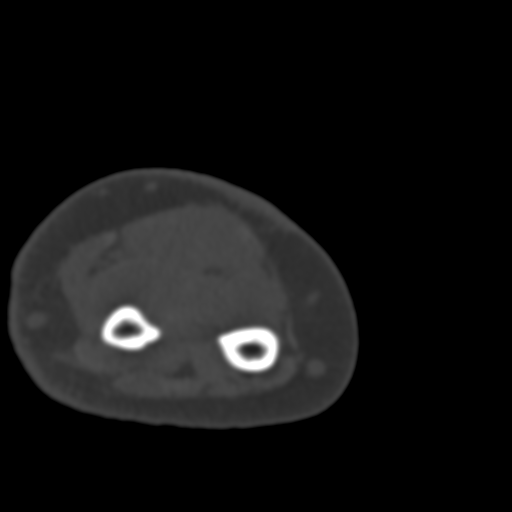
[im 22/58  bone]
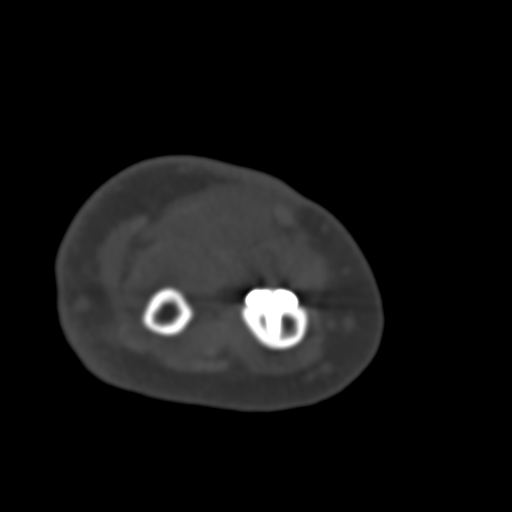
[im 36/58  bone]
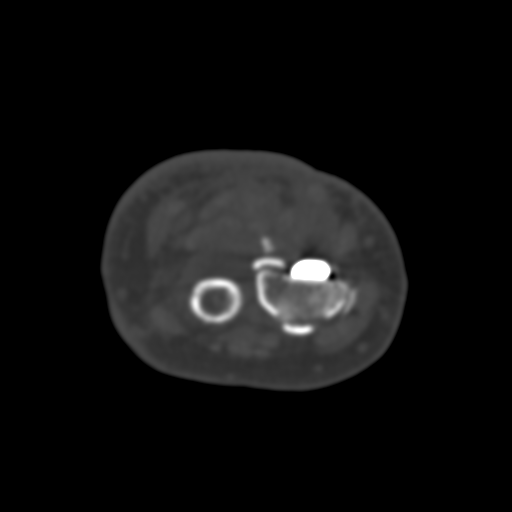
[im 49/58  bone]
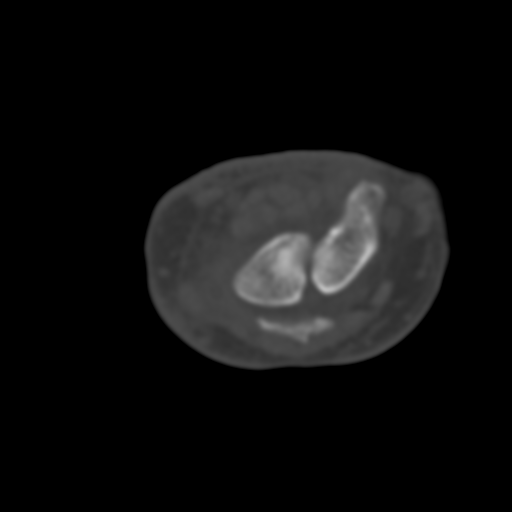

[11 of 36 positions shown; findings below may reference images not displayed]

FINDINGS: Volar plate and screws are seen for fixation of a highly
comminuted fracture of the distal radius with intra-articular
extension.  The patient's hardware appears intact. There is a gap
at the articular surface of the distal radius on the ulnar side of
the fracture immediately adjacent to the lunate measuring 1.1 cm AP
by 0.9 cm transverse. Note is made that two view of the distal
screws in this location are not covered by bone and appear to abut
the lunate.   There appears to be some early erosive change in the
radial aspect of the lunate and adjacent to these two screws.
There appears to be coalescence of bone about the fracture
consistent with healing.  However, no bridging bone the cortical
margins is identified.

The patient also has a fracture of the distal ulna with 0.4 cm of
medial displacement and impaction of up to 0.9 cm.  No bridging
bone about this fracture is identified.
IMPRESSION: 1.  Highly comminuted distal radius fracture.  No bridging bone
about the cortical margins is seen.  There does appear to be some
coalescence of bone centrally in the distal radius at the fracture.
2.  Gap in the medial aspect of the articular surface of the distal
radius is again noted with screws abutting the lunate.  Early
erosive change in the lunate is again noted.
3.  Mildly impacted and medially displaced distal ulnar fracture
without evidence of bridging bone.

## 2010-05-14 ENCOUNTER — Ambulatory Visit (HOSPITAL_COMMUNITY): Admission: RE | Admit: 2010-05-14 | Discharge: 2010-05-14 | Payer: Self-pay | Admitting: Cardiology

## 2010-05-17 ENCOUNTER — Ambulatory Visit (HOSPITAL_COMMUNITY): Admission: RE | Admit: 2010-05-17 | Discharge: 2010-05-17 | Payer: Self-pay | Admitting: Cardiology

## 2010-06-27 ENCOUNTER — Ambulatory Visit (HOSPITAL_COMMUNITY): Admission: RE | Admit: 2010-06-27 | Discharge: 2010-06-27 | Payer: Self-pay | Admitting: Internal Medicine

## 2010-07-16 ENCOUNTER — Ambulatory Visit: Payer: Self-pay | Admitting: Internal Medicine

## 2010-07-25 IMAGING — MG MM DIGITAL SCREENING
4 series · 4 of 4 positions shown · non-contrast
Comparison: Prior studies.

DG SCREEN MAMMOGRAM BILATERAL
Bilateral CC and MLO view(s) were taken.
Prior study comparison: September 29, 2007, bilateral diagnostic mammogram.  September 29, 2007, left 
breast ultrasound.

DIGITAL SCREENING MAMMOGRAM WITH CAD:

[L CC]
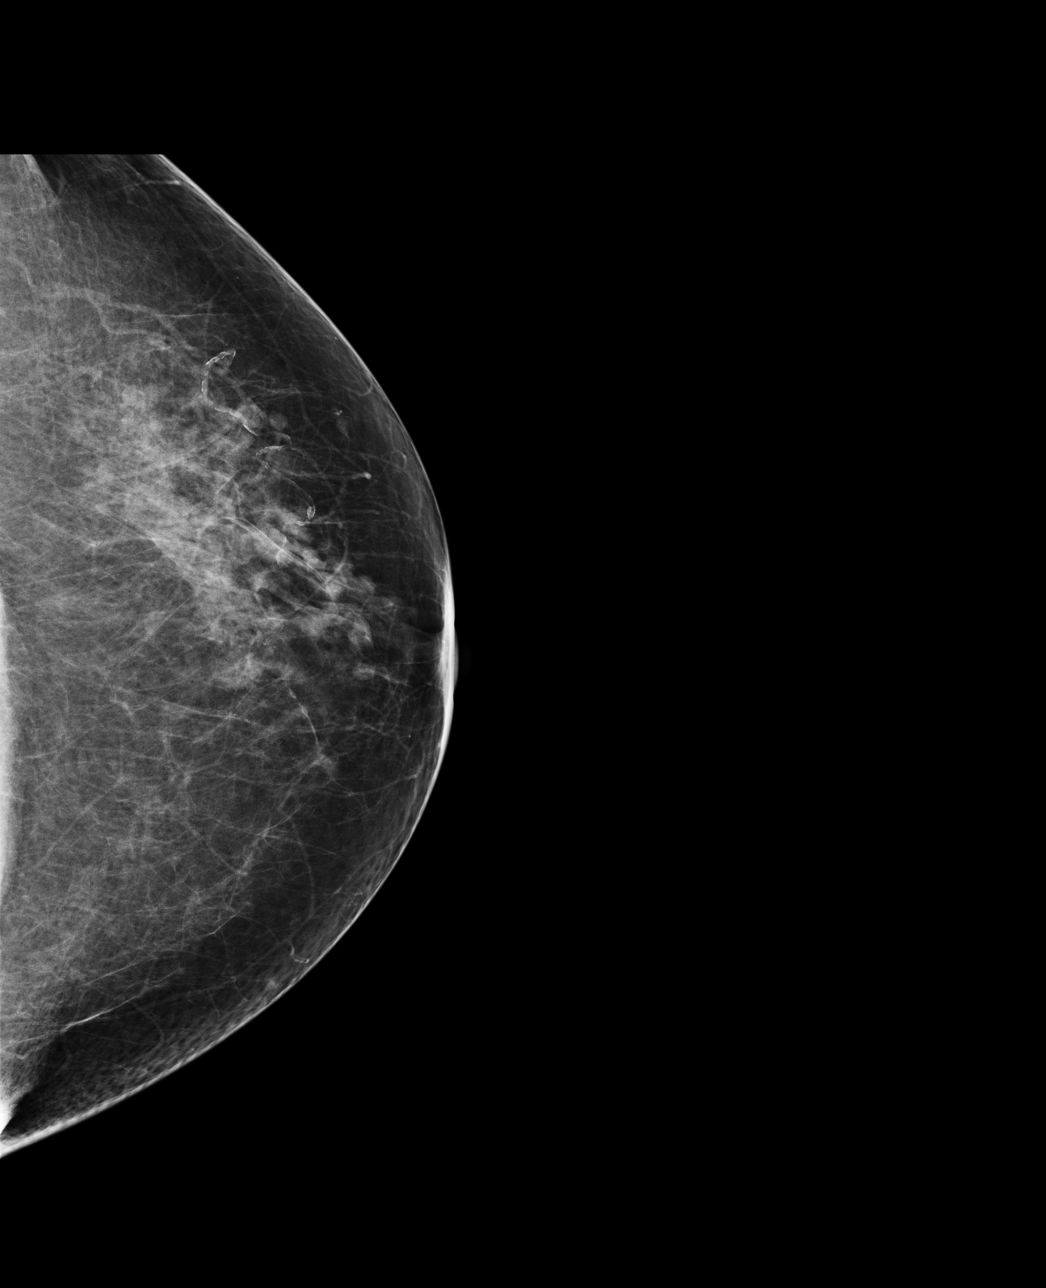

[L MLO]
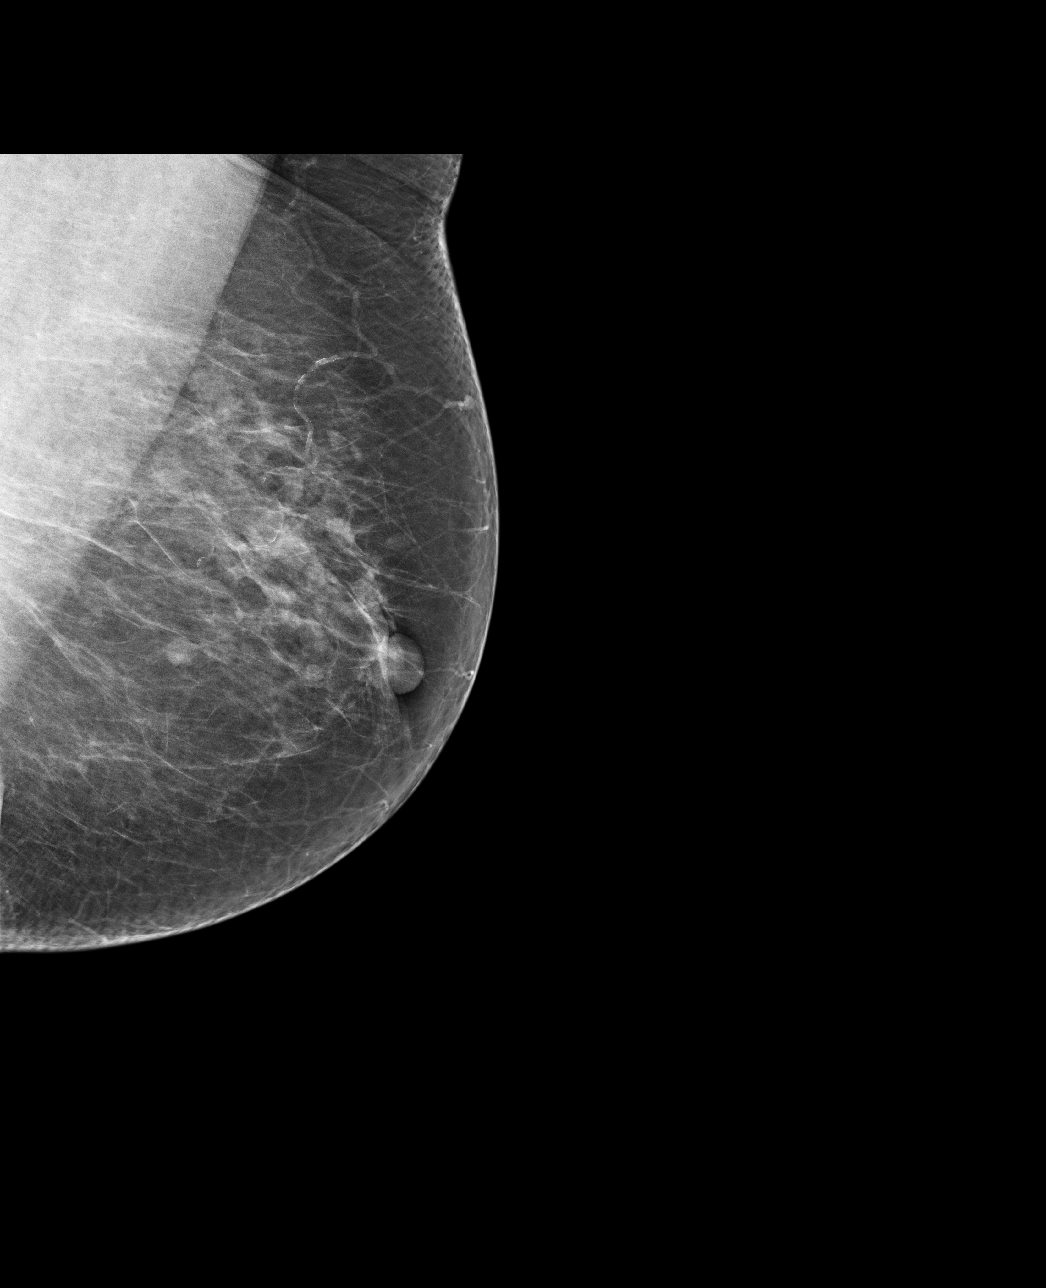

[R CC]
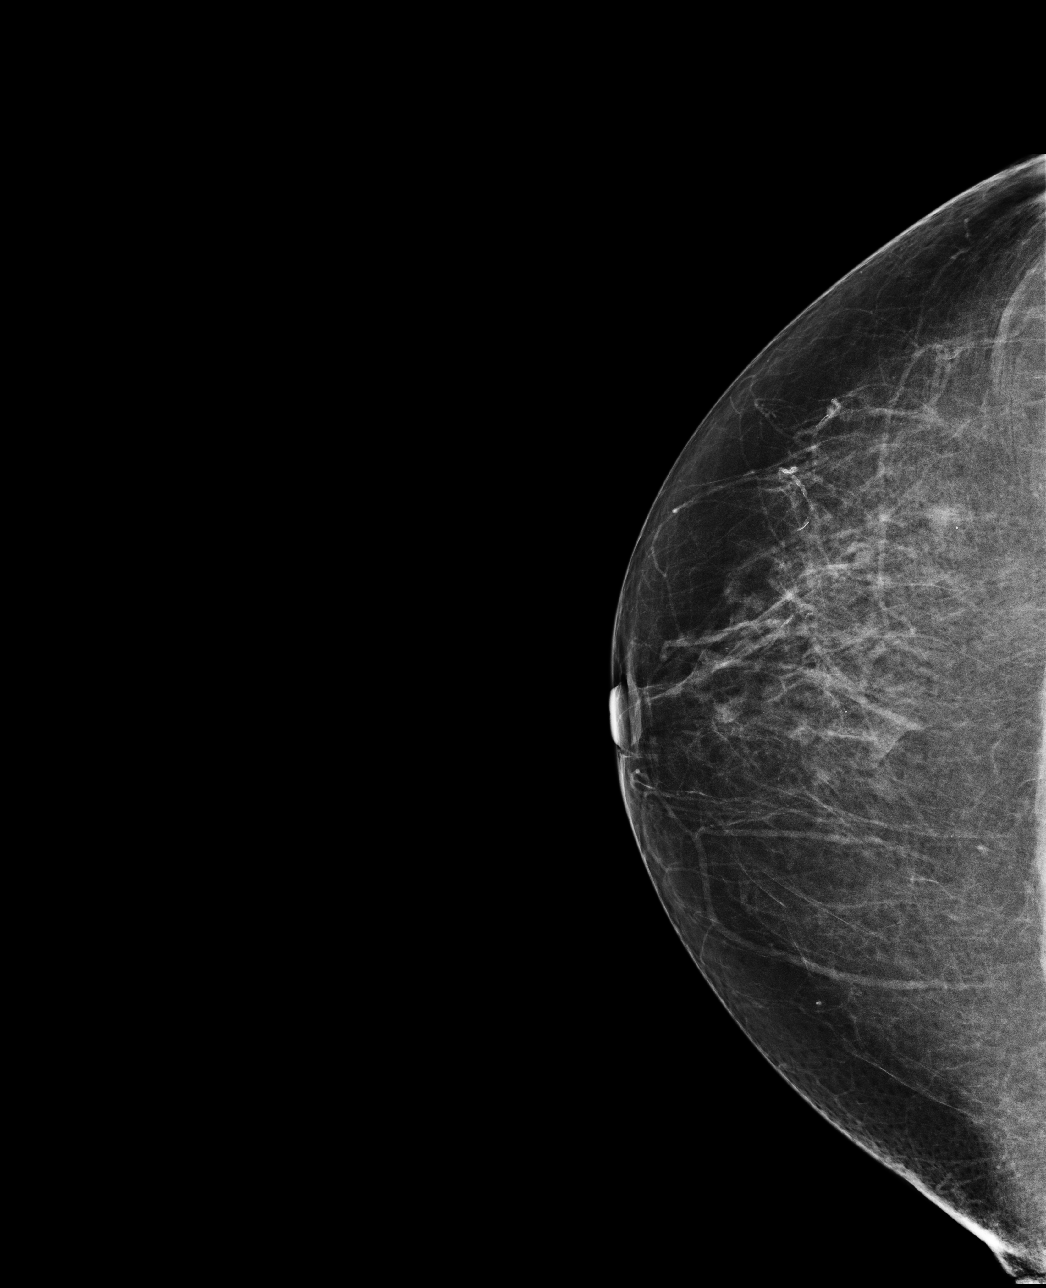

[R MLO]
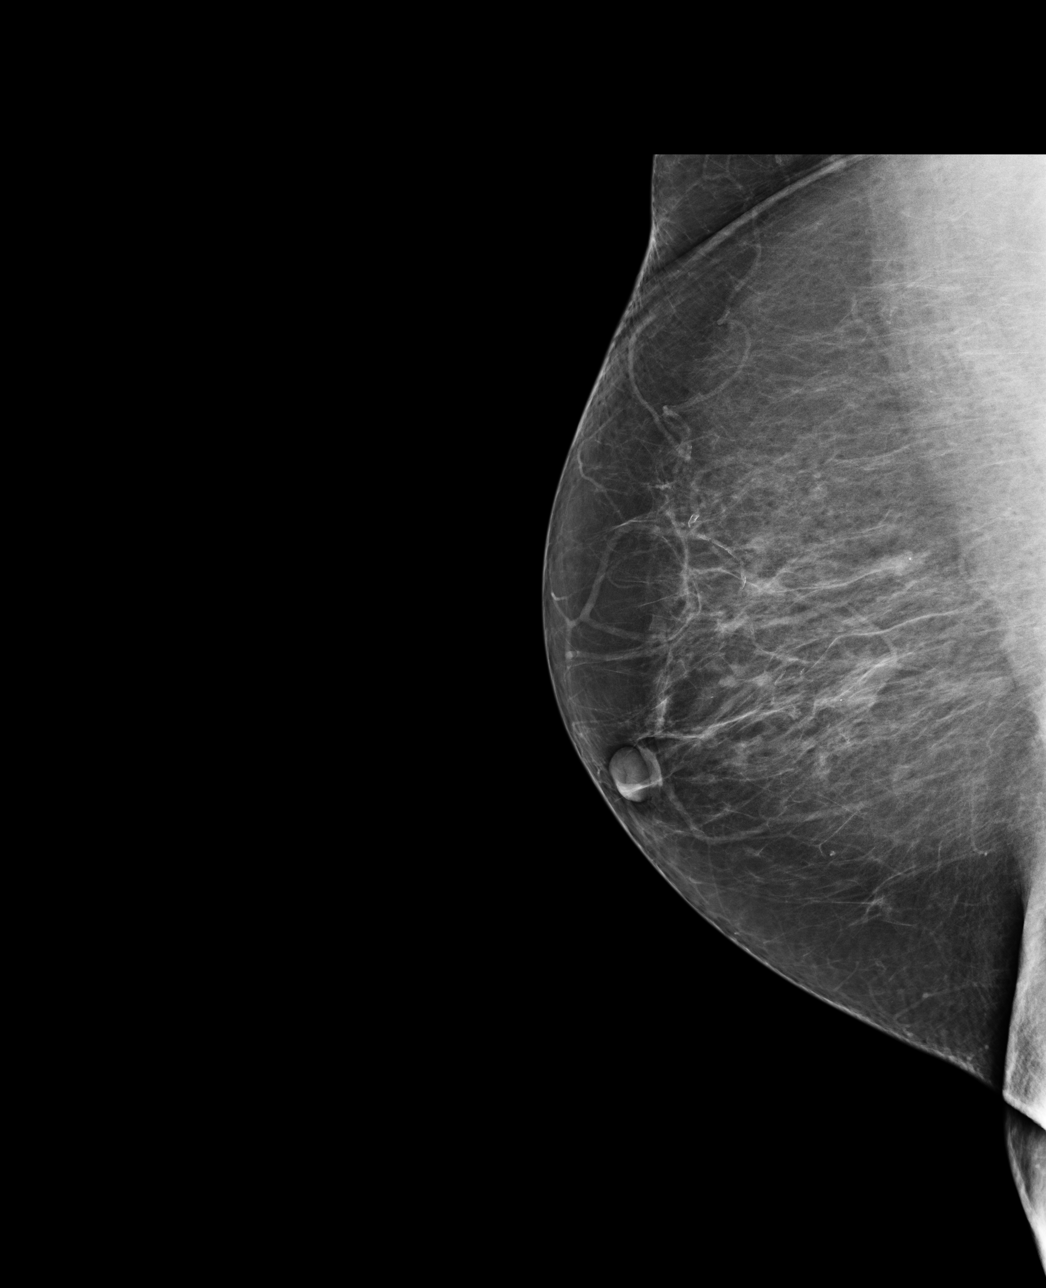

[4 of 4 positions shown; findings below may reference images not displayed]

There are scattered fibroglandular densities.  There is no dominant mass, architectural distortion 
or calcification to suggest malignancy.

Images were processed with CAD.
IMPRESSION: No mammographic evidence of malignancy.  Suggest yearly screening mammography.

A result letter of this screening mammogram will be mailed directly to the patient.

ASSESSMENT: Negative - BI-RADS 1

Screening mammogram in 1 year.
,

## 2010-09-08 HISTORY — PX: MYRINGOTOMY: SHX2060

## 2010-09-08 IMAGING — NM NM MYOCAR MULTI W/SPECT W/WALL MOTION & EF
2 series · 12 of 12 positions shown · non-contrast
Comparison: None

CLINICAL DATA: [DATE]-year-old female with chest pain.  History of
coronary artery disease, previous myocardial infarction and
angioplasty.

MYOCARDIAL IMAGING WITH SPECT (REST AND PHARMACOLOGIC-STRESS)
GATED LEFT VENTRICULAR WALL MOTION STUDY
LEFT VENTRICULAR EJECTION FRACTION
TECHNIQUE: Standard myocardial SPECT imaging was performed after
resting intravenous injection of 10 mCi Ec-BBm tetrofosmin.
Subsequently, intravenous infusion of Lexiscan was performed under
the supervision of the Cardiology staff.  At peak effect of the
drug, 30 mCi Ec-BBm tetrofosmin was injected intravenously and
standard myocardial SPECT  imaging was performed.  Quantitative
gated imaging was also performed to evaluate left ventricular wall
motion, and estimate left ventricular ejection fraction.

[Series 1: cardiac rest stress · 6.39mm/px · 6 of 512 frames shown (1 of 2)]
[frame 43/512]
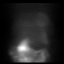
[frame 128/512]
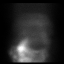
[frame 214/512]
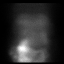
[frame 299/512]
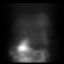
[frame 384/512]
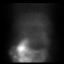
[frame 470/512]
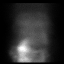

[Series 1: cardiac rest stress · 6.39mm/px · 6 of 64 frames shown (2 of 2)]
[frame 6/64]
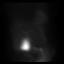
[frame 16/64]
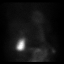
[frame 27/64]
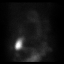
[frame 38/64]
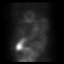
[frame 48/64]
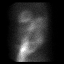
[frame 59/64]
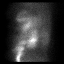

[12 of 12 positions shown; findings below may reference images not displayed]

FINDINGS: There are no abnormal areas of left ventricular activity
to suggest inducible left ventricular myocardial ischemia.
A small fixed defect along the anterior septal left ventricular
wall is noted.

Left ventricular ejection fraction is calculated at 69% with end-
diastolic volume of 110 ml and end-systolic volume of 34 ml.
Normal left ventricular wall motion and thickening is identified.
IMPRESSION: No evidence of inducible left ventricular myocardial ischemia.

Question small fixed defect/possible prior infarct along the
anterior septal left ventricular wall.

Left ventricular ejection fraction calculated at 69%.

## 2010-09-19 ENCOUNTER — Ambulatory Visit
Admission: RE | Admit: 2010-09-19 | Discharge: 2010-09-19 | Payer: Self-pay | Source: Home / Self Care | Attending: Otolaryngology | Admitting: Otolaryngology

## 2010-09-21 ENCOUNTER — Emergency Department (HOSPITAL_COMMUNITY)
Admission: EM | Admit: 2010-09-21 | Discharge: 2010-09-21 | Payer: Self-pay | Source: Home / Self Care | Admitting: Emergency Medicine

## 2010-09-23 LAB — URINE MICROSCOPIC-ADD ON

## 2010-09-23 LAB — URINALYSIS, ROUTINE W REFLEX MICROSCOPIC
Bilirubin Urine: NEGATIVE
Ketones, ur: NEGATIVE mg/dL
Leukocytes, UA: NEGATIVE
Nitrite: NEGATIVE
Protein, ur: NEGATIVE mg/dL
Specific Gravity, Urine: 1.025 (ref 1.005–1.030)
Urine Glucose, Fasting: NEGATIVE mg/dL
Urobilinogen, UA: 0.2 mg/dL (ref 0.0–1.0)
pH: 5 (ref 5.0–8.0)

## 2010-09-23 LAB — CBC
HCT: 28.2 % — ABNORMAL LOW (ref 36.0–46.0)
Hemoglobin: 9 g/dL — ABNORMAL LOW (ref 12.0–15.0)
MCH: 25.6 pg — ABNORMAL LOW (ref 26.0–34.0)
MCHC: 31.9 g/dL (ref 30.0–36.0)
MCV: 80.3 fL (ref 78.0–100.0)
Platelets: 103 10*3/uL — ABNORMAL LOW (ref 150–400)
RBC: 3.51 MIL/uL — ABNORMAL LOW (ref 3.87–5.11)
RDW: 15.1 % (ref 11.5–15.5)
WBC: 3.5 10*3/uL — ABNORMAL LOW (ref 4.0–10.5)

## 2010-09-23 LAB — BASIC METABOLIC PANEL
BUN: 18 mg/dL (ref 6–23)
CO2: 24 mEq/L (ref 19–32)
Calcium: 8.5 mg/dL (ref 8.4–10.5)
Chloride: 107 mEq/L (ref 96–112)
Creatinine, Ser: 0.67 mg/dL (ref 0.4–1.2)
GFR calc Af Amer: >60 mL/min (ref >60)
GFR calc non Af Amer: >60 mL/min (ref >60)
Glucose, Bld: 140 mg/dL — ABNORMAL HIGH (ref 70–99)
Potassium: 4 mEq/L (ref 3.5–5.1)
Sodium: 138 mEq/L (ref 135–145)

## 2010-09-28 ENCOUNTER — Encounter: Payer: Self-pay | Admitting: Internal Medicine

## 2010-09-29 ENCOUNTER — Encounter (INDEPENDENT_AMBULATORY_CARE_PROVIDER_SITE_OTHER): Payer: Self-pay | Admitting: Internal Medicine

## 2010-09-29 ENCOUNTER — Encounter: Payer: Self-pay | Admitting: Internal Medicine

## 2010-09-29 ENCOUNTER — Encounter: Payer: Self-pay | Admitting: Family Medicine

## 2010-11-24 LAB — GLUCOSE, CAPILLARY: Glucose-Capillary: 122 mg/dL — ABNORMAL HIGH (ref 70–99)

## 2010-11-24 LAB — POCT I-STAT, CHEM 8
BUN: 22 mg/dL (ref 6–23)
Calcium, Ion: 1.22 mmol/L (ref 1.12–1.32)
Chloride: 105 mEq/L (ref 96–112)
Sodium: 143 mEq/L (ref 135–145)

## 2010-12-10 ENCOUNTER — Other Ambulatory Visit (HOSPITAL_COMMUNITY): Payer: Self-pay | Admitting: Family Medicine

## 2010-12-10 DIAGNOSIS — Z139 Encounter for screening, unspecified: Secondary | ICD-10-CM

## 2010-12-10 LAB — POCT I-STAT, CHEM 8
BUN: 21 mg/dL (ref 6–23)
Creatinine, Ser: 0.8 mg/dL (ref 0.4–1.2)
Hemoglobin: 11.2 g/dL — ABNORMAL LOW (ref 12.0–15.0)
Potassium: 3.9 mEq/L (ref 3.5–5.1)
Sodium: 142 mEq/L (ref 135–145)
TCO2: 26 mmol/L (ref 0–100)

## 2010-12-10 LAB — CBC
HCT: 37.9 % (ref 36.0–46.0)
Hemoglobin: 12.5 g/dL (ref 12.0–15.0)
MCV: 91.9 fL (ref 78.0–100.0)
RBC: 4.12 MIL/uL (ref 3.87–5.11)
WBC: 9.1 10*3/uL (ref 4.0–10.5)

## 2010-12-10 LAB — HEPATIC FUNCTION PANEL
Albumin: 3.4 g/dL — ABNORMAL LOW (ref 3.5–5.2)
Alkaline Phosphatase: 76 U/L (ref 39–117)
Indirect Bilirubin: 0.6 mg/dL (ref 0.3–0.9)
Total Protein: 6.5 g/dL (ref 6.0–8.3)

## 2010-12-10 LAB — BASIC METABOLIC PANEL
CO2: 27 mEq/L (ref 19–32)
Chloride: 103 mEq/L (ref 96–112)
Sodium: 139 mEq/L (ref 135–145)

## 2010-12-10 LAB — DIFFERENTIAL
Eosinophils Absolute: 0.3 10*3/uL (ref 0.0–0.7)
Eosinophils Relative: 3 % (ref 0–5)
Lymphs Abs: 1.7 10*3/uL (ref 0.7–4.0)
Monocytes Relative: 7 % (ref 3–12)

## 2010-12-10 LAB — GLUCOSE, CAPILLARY: Glucose-Capillary: 141 mg/dL — ABNORMAL HIGH (ref 70–99)

## 2011-01-06 ENCOUNTER — Ambulatory Visit (HOSPITAL_COMMUNITY)
Admission: RE | Admit: 2011-01-06 | Discharge: 2011-01-06 | Disposition: A | Discharge status: Home / Self Care | Payer: Medicare Other | Source: Ambulatory Visit | Attending: Family Medicine | Admitting: Family Medicine

## 2011-01-06 DIAGNOSIS — Z1231 Encounter for screening mammogram for malignant neoplasm of breast: Secondary | ICD-10-CM | POA: Insufficient documentation

## 2011-01-06 DIAGNOSIS — Z139 Encounter for screening, unspecified: Secondary | ICD-10-CM

## 2011-01-18 IMAGING — MR MR HEAD WO/W CM
9 of 10 series · 34 of 48 positions shown · IV contrast (multihance)
Comparison: CT 08/17/2009

CLINICAL DATA: Vertigo.

MRI HEAD WITHOUT AND WITH CONTRAST
TECHNIQUE: Multiplanar, multiecho pulse sequences of the brain and
surrounding structures were obtained according to standard protocol
without and with intravenous contrast
Contrast: 18 ml Multihance IV

[Series 2: T1 · sagittal · 5.0mm · 0.38mm/px · 1 of 17 slices shown (1 of 2)]
[im 1/17]
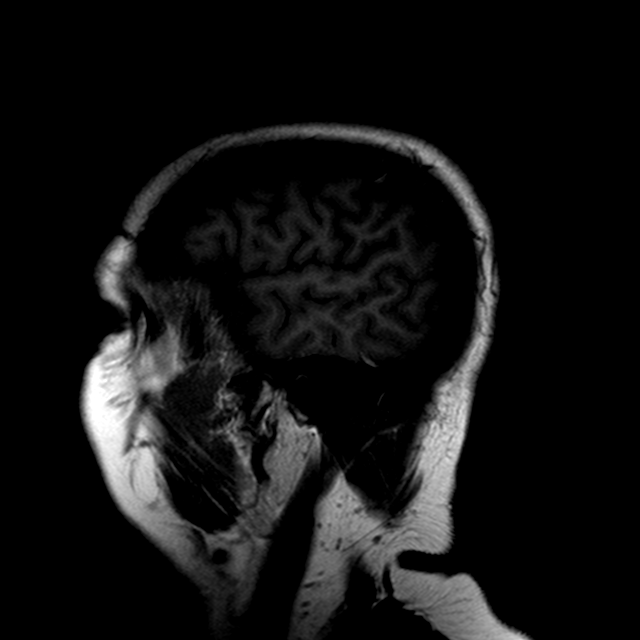

[Series 3: DWI · axial · 5.0mm · 0.72mm/px · z∈[-36,+107]mm · 8 of 69 slices shown (1 of 2)]
[im 1/69]
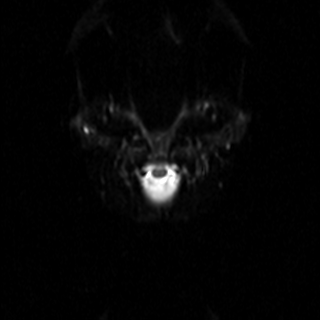
[im 10/69]
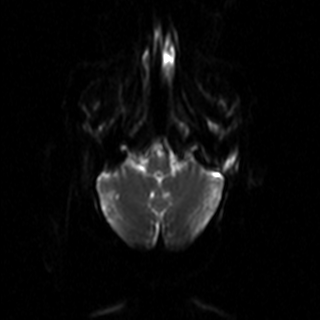
[im 20/69]
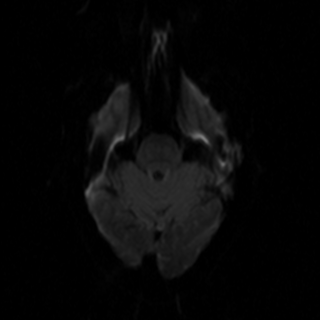
[im 30/69]
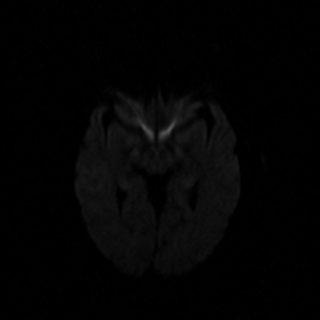
[im 39/69]
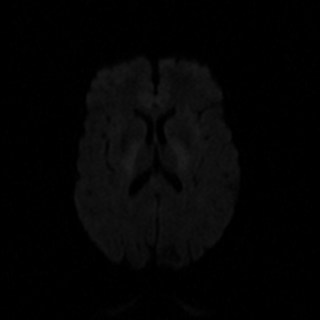
[im 49/69]
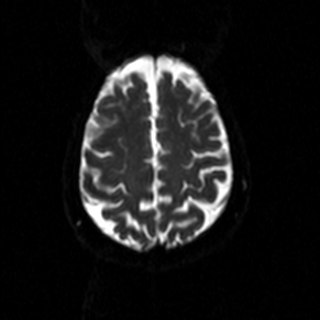
[im 59/69]
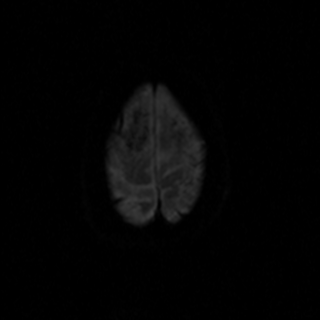
[im 69/69]
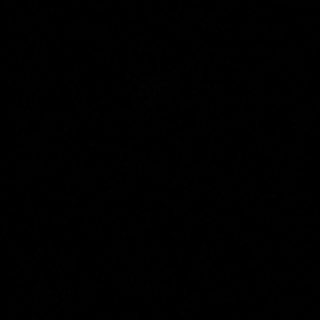

[Series 4: DWI · axial · 5.0mm · 0.72mm/px · z∈[-36,+107]mm · 3 of 23 slices shown (2 of 2)]
[im 1/23]
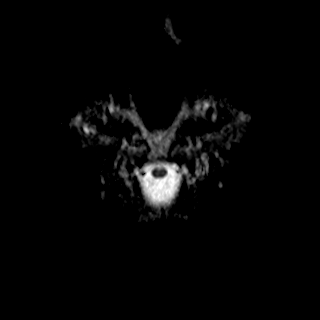
[im 12/23]
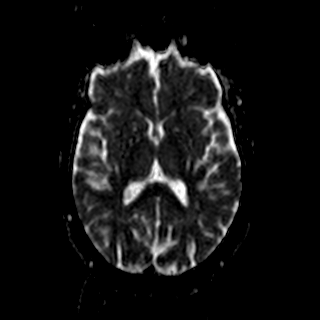
[im 23/23]
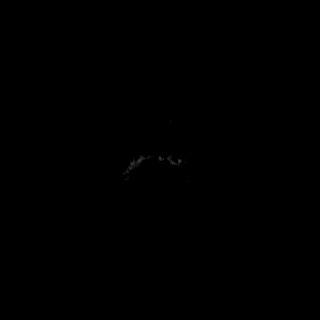

[Series 5: T2 · axial · 5.0mm · 0.57mm/px · z∈[-36,+102]mm · 3 of 24 slices shown (1 of 2)]
[im 1/24]
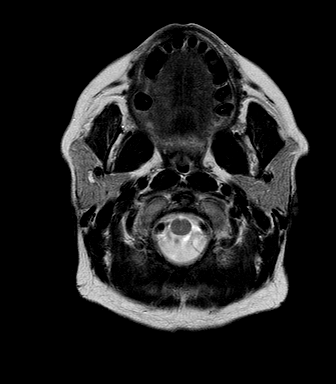
[im 12/24]
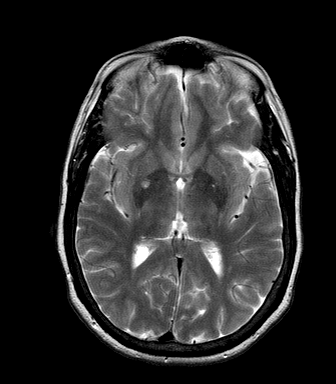
[im 24/24]
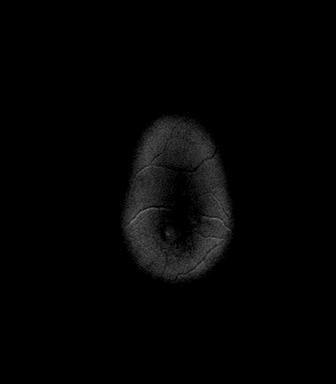

[Series 6: T1 · axial · 2.0mm · 0.55mm/px · z∈[-46,-30]mm · 2 of 80 slices shown (2 of 2)]
[im 1/80]
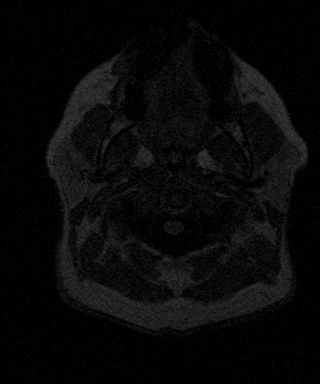
[im 9/80]
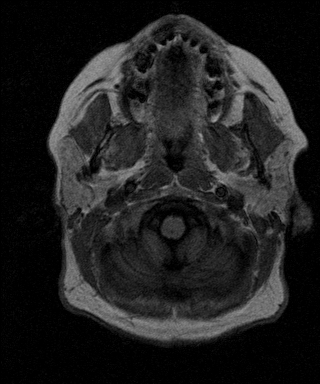

[Series 7: FLAIR · axial · 5.0mm · 0.64mm/px · z∈[-36,+102]mm · 3 of 24 slices shown]
[im 1/24]
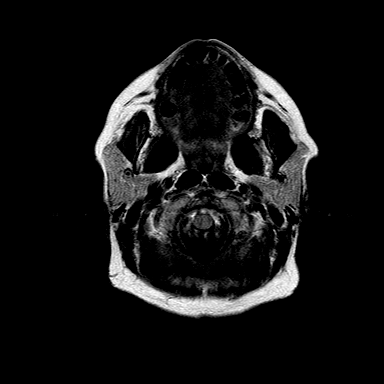
[im 12/24]
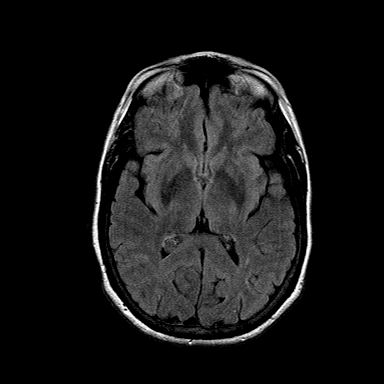
[im 24/24]
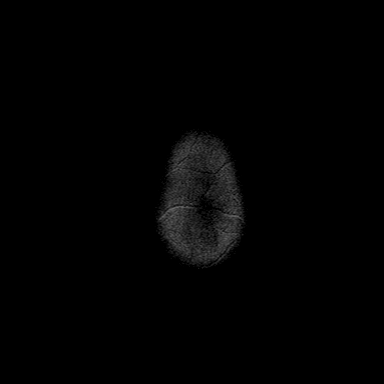

[Series 8: T2 · axial · 5.0mm · 0.51mm/px · z∈[-36,+102]mm · 3 of 24 slices shown (2 of 2)]
[im 1/24]
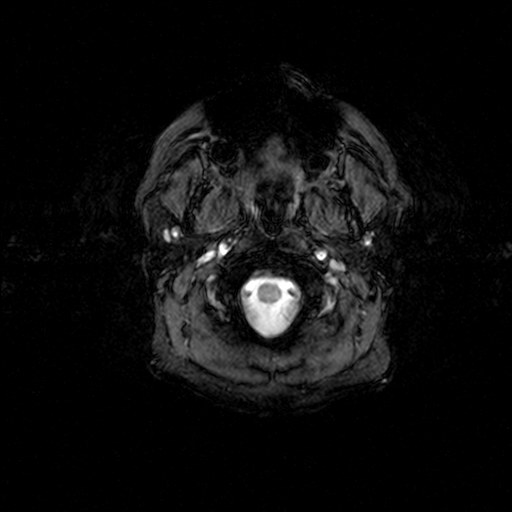
[im 12/24]
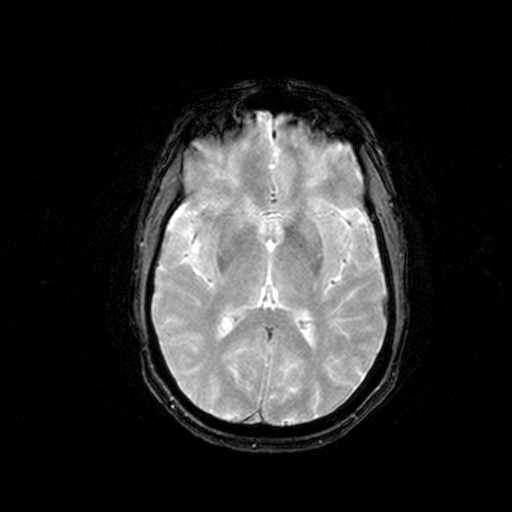
[im 24/24]
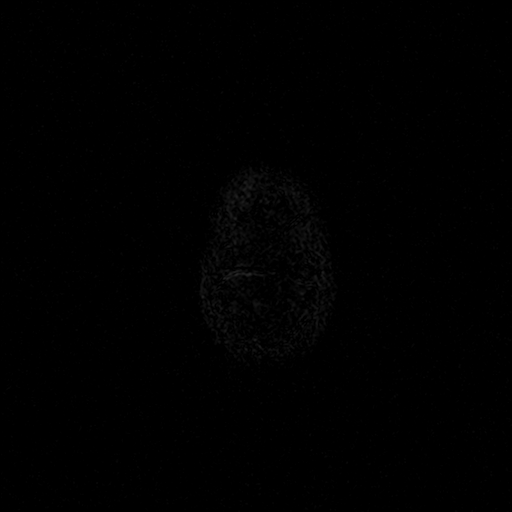

[Series 10: T1 post-contrast · axial · 2.0mm · 0.55mm/px · z∈[-46,+112]mm · 8 of 80 slices shown (1 of 2)]
[im 1/80]
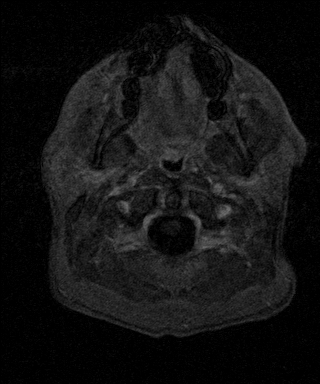
[im 9/80]
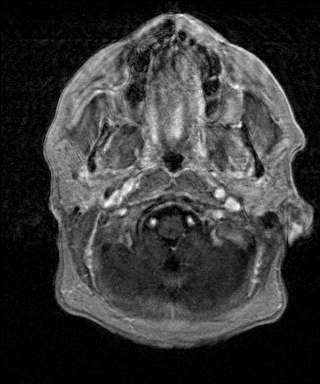
[im 27/80]
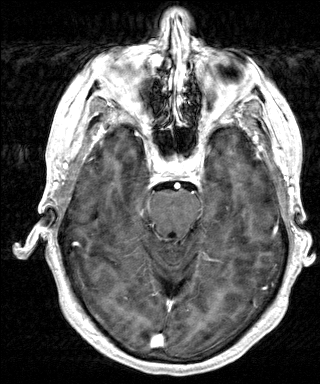
[im 36/80]
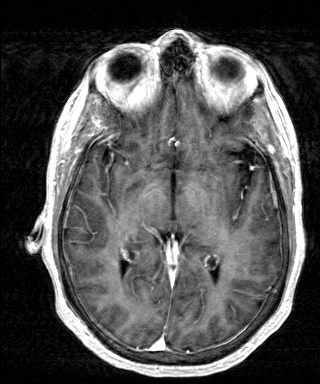
[im 44/80]
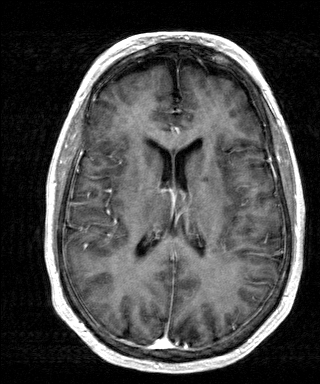
[im 53/80]
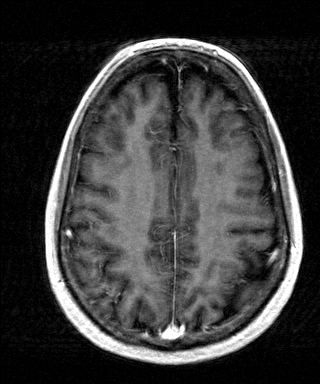
[im 71/80]
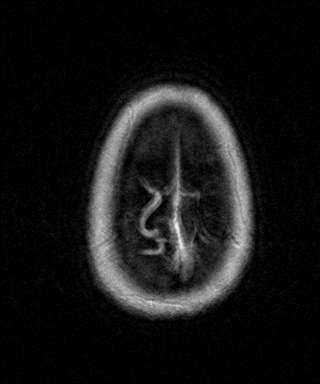
[im 80/80]
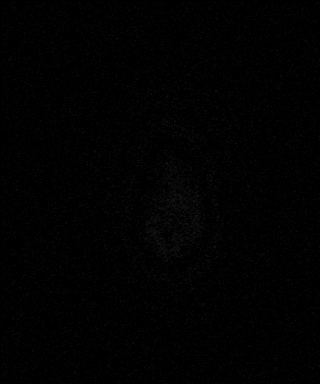

[Series 12: T1 post-contrast · coronal · 5.0mm · 0.43mm/px · 3 of 24 slices shown (2 of 2)]
[im 1/24]
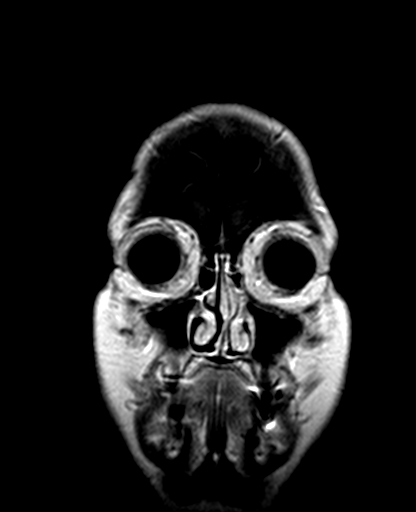
[im 12/24]
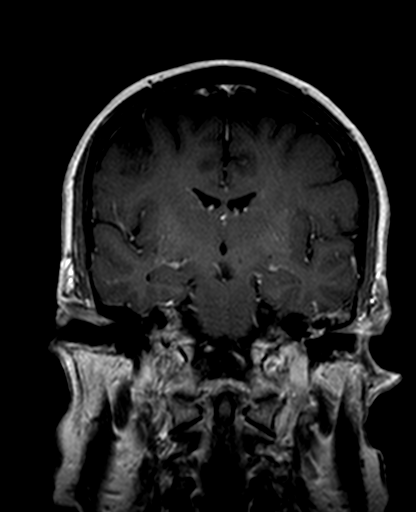
[im 24/24]
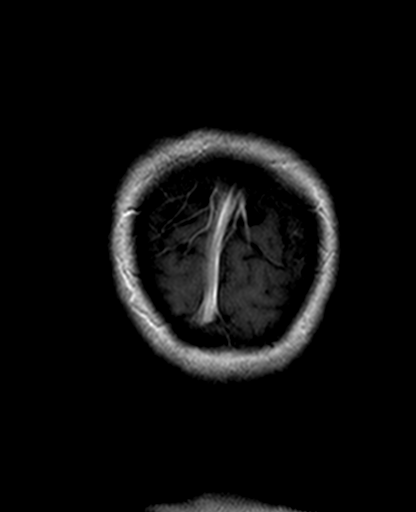

[34 of 48 positions shown; findings below may reference images not displayed]

FINDINGS: Negative for acute infarct.  Scattered small white matter
hyperintensities bilaterally.  These are most compatible with
chronic microvascular ischemia.  Small chronic infarct in the left
internal capsule.  Benign neuroepithelial cyst in the right basal
ganglia.  Small chronic infarct in the left thalamus and the right
cerebellum.  Mild chronic ischemic change in the pons.

Negative for intracranial hemorrhage or mass.

Postcontrast images of brain are degraded by motion however no
pathologic enhancement is identified.

Left mastoid sinus effusion.    Mild mucosal edema in the right
mastoid sinus. Remainder of the paranasal sinuses are clear.
IMPRESSION: Chronic microvascular ischemia.  No acute intracranial abnormality.

## 2011-01-21 NOTE — Op Note (Signed)
[name], [name]                ACCOUNT NO.:  [id number]   MEDICAL RECORD NO.:  [id number]          PATIENT TYPE:  OIB   LOCATION:  2550                         FACILITY:  [facility]   PHYSICIAN:  Clent Demark. Rankin, M.D.   DATE OF BIRTH:  Jan 10, 1941   DATE OF PROCEDURE:  08/02/2007  DATE OF DISCHARGE:  08/02/2007                               OPERATIVE REPORT   PREOPERATIVE DIAGNOSES:  Epiretinal membrane--internal limiting membrane  and epiretinal membrane left eye--#25 gauge.   POSTOPERATIVE DIAGNOSIS:  Epiretinal membrane--internal limiting  membrane and epiretinal membrane left eye--#25 gauge.   PROCEDURE:  Posterior vitrectomy with membrane peel--internal limiting  membrane and epiretinal membrane left eye.   SURGEON:  Clent Demark. Rankin, M.D.   ANESTHESIA:  Local with MAC.   INDICATIONS FOR PROCEDURE:  The patient is a 70 year old woman with  profound impact on activities of daily living from her left eye with the  basis of an epiretinal membrane, maculotropic distortion and macular  edema.  The patient is admitted at this time in an attempt to remove the  maculotropic distortion.   The patient understands the risks of anesthesia including the rare loss  of the eye,  and including but not limited to the aforementioned  condition, surgical repair, hemorrhage, convex cone scarring,  need for  further surgery, no change in vision, loss of vision and progressive  disease despite intervention.  Appropriate signed consent was obtained.   PROCEDURE:  The patient was taken to the operating room.  In the  operating room appropriate monitors were positioned followed by mild  sedation.  Marcaine 0.75% was delivered and 5 mL retrobulbar for an  additional 5 mL.  The left eye region received a modified Kirk Ruths.  The  left macular region was sterilely prepped and draped in the usual  ophthalmic fashion.  A lid speculum was applied.  A 25-gauge trocar was  placed into the appropriate  quadrant.  The infusion was then secured and  turned on.  A superior trocar was applied.   Core vitrectomy was then begun.  The vitreous skirt was trimmed 360  degrees.  Then #25 gauge forceps was used to engage the epiretinal  membrane.  This was removed as a nearly continuous sheet.  There was  underlying internal limiting membrane stria, and this was grasped with  forceps as well and was also removed as a continuous sheet off the  foveal region.   At this time it became apparent that the patient had 100% oxygen  saturation but seemed to have somewhat labored breathing.  At this time  after careful inspection of the retina was done, the retina was intact  with no holes or tears.  The superior trocar was removed.  The wounds  were secured.  The infusion was removed.  Subconjunctival Decadron was  applied. A sterile patch and Fox shield were applied to the left eye  quickly.   The patient continued to breath normally but was unresponsive to  gestures with hand movements.  At request, however, she was able to move  her feet thus suggesting  that she might have had some migration  posteriorly of local  anesthetic from the orbital cavity into the CNS cavity.  For this reason  the patient continued to be observed in the PACU.  She has done well and  for probably 15 minutes to 20 minutes in the PACU without other  complications and is responsive and moving her upper extremities without  difficulty.      Clent Demark Rankin, M.D.  Electronically Signed     GAR/MEDQ  D:  08/02/2007  T:  08/03/2007  Job:  [id number]

## 2011-01-21 NOTE — Assessment & Plan Note (Signed)
NAME:  [name], [name]                 CHART#:  [id number]   DATE:  01/21/2007                       DOB:  [birth year]   PRESENTING COMPLAINT:  Here to discuss the results of liver biopsy and  follow up on iron-deficiency anemia.   SUBJECTIVE:  The patient is a 70 year old Caucasian female who was  recently evaluated for iron-deficiency anemia.  She had an EGD and a  colonoscopy on 12/18/2006.  She had 3 columns of grade 2 esophageal  varices, small sliding hiatal hernia, and erosive gastritis.  She had a  few diverticula at the sigmoid colon.  She had 3 polyps that were  ablated by a core biopsy, and all of these turned out to be adenomatous.  Given the findings of esophageal varices, she was further evaluated with  an ultrasound.  She had hepatosplenomegaly with contour of the liver,  suggesting cirrhosis, but there are no focal masses or ascites.  Her  spleen measured 14.2 cm, and liver was 20.2 cm in length.  Her INR was  1.2.  Her sed rate was 13, ANA and SMA were negative.  She also had  negative hepatitis B surface antigen and nonreactive hepatitis C  antibody.  Her H. pylori serology was negative.  Her iron studies were  as follows:  Serum iron was 53, TIBC 497 with saturation of 11%, serum  ferritin was 8, and B12 levels were normal.   She finally had ultrasound guided liver biopsy on 01/06/2007 by Dr.  Greggory Keen.  It was read by Dr. Gari Crown.  She was diagnosed with  cirrhosis.  She had mild inflammation.  Based on the description, I  would think that she had activity grade A1 and fibrosis grade 2, but I  will ask them to reinterpret this for me.   The patient states that she has done a lot of reading about the  cirrhosis on her own.  Her biggest concern is one of variceal bleed.  She was reassured that varices are small, and as long as they are small,  the risk is minimal, and she would have to be re-screened in a year or  so.  She complains of feeling light-headed at  times, and she wonders if  her hemoglobin has dropped again.  She is exercising every day.  She  gets on her elliptical for 30 minutes.  Her goal is to try to increase  the duration to maybe 45 minutes.   MEDICATIONS:  1. Diovan 160 mg daily.  2. Prevacid 30 mg every morning.  3. ASA 1 gram every day.  4. Premarin 0.65 mg daily.  5. NovoLog 70/30, 80 units in the a.m. and 80 units in the p.m.  6. Glipizide 4 mg b.i.d.  7. Toprol XL 50 mg b.i.d.  8. Gabapentin 300 mg b.i.d.  9. Metformin 850 mg b.i.d.  10.Nitrofurantoin 100 mg daily.  11.Excedrin PM p.r.n.  12.Ferrous sulfate 325 mg daily.   OBJECTIVE:  VITAL SIGNS:  Weight 210 pounds. She is 5 feet, 3 inches  tall.  Her weight is down about 5 pounds since her initial visit of  10/29/2006.  Pulse is 80 per minute, blood pressure 158/58, temperature  98.6.  HEENT:  Conjunctivae pink, sclerae nonicteric, oropharyngeal mucosa is  normal.  NECK:  No neck masse are noted.  ABDOMEN:  Protuberant.  Spleen is not palpable.  Prominent left lobe of  the liver, which is mildly tender.  No shifting dullness noted.  Abdomen  is otherwise soft.  EXTREMITIES:  No peripheral edema.   ASSESSMENT:  1. Cirrhosis secondary to nonalcoholic steatohepatitis.  All her other      markers are negative.  We did not check ceruloplasmin or alpha-      antitrypsin levels, but there is nothing on her liver biopsies to      suggest these diagnoses.  While there is no specific therapy, it is      very important that her glycemic control is optimal.  Her      hemoglobin A1c was 7.2 in January 2008, and I feel this could be      repeated.  If she is able to exercise or lose weight, it is quite      possible the need for insulin will go down, which would further      help her.  Exercise would result in less insulin-resistance and      greater insulin sensitivity.  2. We also discussed whether or not she wants to participate in study      protocol through one of  the tertiary centers, but she is not      interested.  3. She has received hepatitis A vaccine and first shot of hepatitis B      vaccine, and she is planning to get the other 2 doses of hepatitis      B vaccine.  4. Iron deficiency anemia.  I suspect that this is due to occult blood      loss from her small bowel, since she is on acetylsalicylic acid,      and she may have to cut the dose down.   PLAN:  1. She will have a CBC, hemoglobin A1c, and alpha-fetoprotein as a      baseline, which was not done on her last visit.  2. She will continue daily exercise program.  She is also seeing Miss      Francena Hanly to help her with her diabetes and weight control,      etc.  3. She will return for OV in 4 months.  4. We will plan to recheck her alpha-fetoprotein in 6 months and      consider an EGD in about a year.       Hildred Laser, M.D.  Electronically Signed     NR/MEDQ  D:  01/22/2007  T:  01/22/2007  Job:  [id number]   cc:   Donnal Moat, PA  Bonne Dolores, M.D.

## 2011-01-21 NOTE — Discharge Summary (Signed)
[name], [name]                ACCOUNT NO.:  [id number]   MEDICAL RECORD NO.:  [id number]          PATIENT TYPE:  OBV   LOCATION:  A330                          FACILITY:  [facility]   PHYSICIAN:  Bonnielee Haff, MD     DATE OF BIRTH:  [birth year]   DATE OF ADMISSION:  01/26/2008  DATE OF DISCHARGE:  LH                               DISCHARGE SUMMARY   PRIMARY MEDICAL DOCTOR:  Barbara Gilles. Gerarda Fraction, MD at [facility].   Please review H and P dictated yesterday for details regarding the  patient's presenting illness.   DISCHARGE DIAGNOSES:  1. Hypoglycemia possibly as a result of altered diet or secondary to      complications of liver cirrhosis.  2. Insulin-dependent diabetes.  3. History of nonalcoholic liver cirrhosis.  4. Coronary artery disease .   BRIEF HOSPITAL COURSE:  Briefly, this is a 70 year old Caucasian female  who was at her doctor's office yesterday for evaluation of chest pain  when she had a syncopal episode, and her CBG was found to be 18.  The  patient was transferred to the emergency department and was given D50.  Her blood sugars fluctuated in the ED, and so she was observed  overnight.  She was put on a D5 drip overnight which was discontinued at  about midnight because her CBGs were running greater than 200.  This  morning, her CBG was 91 and then 89.  She said she did not feel like  having the hospital provide her breakfast, and so she is waiting on her  husband to provide her with breakfast.  She denies any symptoms, no  chest pain, and she is feeling much better.   So the reason for this hypoglycemia is not clear.  As mentioned in my H  and P, this could be because she missed her lunch yesterday and had a  large dose of insulin yesterday morning.  However, another possibility  is that her liver cirrhosis could be playing a role.  Maybe her glucose  production is lesser than usual.  Her insulin requirements may be lower  now.  What I have told  her is to cut her insulin dose in half.  I have  told her to check her blood sugars three times a day before meals and  maintain a record for her doctor.  I have also told her to see her PMD  in the next day or two.  We will check her CBG one hour after breakfast  today, and if it is greater than 180, I will provide her with a dose of  70/30 before she leaves, otherwise, I will ask her to check her sugars  today and start her insulin from tonight.   It should also be noted that she presented to the ED on the 19th of this  month with chest pain which resolved with nitroglycerin.  She has not  had any further episodes of that.  She does have known coronary artery  disease with an MI that happened in 1995 for which she required a stent  or RCA.  Her last cath was in November of 2007 which was unremarkable.  I will make an appointment with [facility] cardiology for follow up.   DISCHARGE MEDICATIONS:  1. NovoLog 70/30 40 units twice daily start tonight.  2. Diovan 160 mg daily.  3. Omeprazole 30 mg daily.  4. Premarin 0.625 mg daily.  I have asked her to discuss this      medication with her PMD.  She probably should not be on this long      term.  5. Gabapentin 300 mg daily.  6. Vytorin 10-40 daily.  7. Aspirin 325 mg daily.  8. Nitrofurantoin 15 mg daily.  9. Ambien 10 mg as needed.  10.Nadolol 20 mg b.i.d.   FOLLOW-UP  1. Follow up with PMD in the next day or two.  [facility] in one to two weeks.   DIET:  As before.   PHYSICAL ACTIVITY:  No restrictions.   No consultations obtained during this admission.   The patient has been told that if her blood sugars again start dropping,  she needs to seek attention immediately.  Then she needs to discuss her  diabetic management with her PMD during her next appointment.   The patient was examined this morning.  She was found to be without any  complaints.  Her vital signs are all pretty stable.  CBGs have  been discussed  earlier.  Lungs were clear.  Cardiac exam was unremarkable.   Labs were also unremarkable this morning.  Her white count has improved  from 13,500.  She has been afebrile.  LFTs were not checked during this  admission.   TOTAL TIME ON THIS DISCHARGE ENCOUNTER:  35 minutes.      Bonnielee Haff, MD  Electronically Signed     GK/MEDQ  D:  01/27/2008  T:  01/27/2008  Job:  [id number]   cc:   Barbara Gilles. Gerarda Fraction, MD  Fax: [phone]

## 2011-01-21 NOTE — H&P (Signed)
[name], [name]                ACCOUNT NO.:  [id number]   MEDICAL RECORD NO.:  [id number]          PATIENT TYPE:  OBV   LOCATION:  A330                          FACILITY:  [facility]   PHYSICIAN:  Bonnielee Haff, MD     DATE OF BIRTH:  1940/11/25   DATE OF ADMISSION:  01/26/2008  DATE OF DISCHARGE:  LH                              HISTORY & PHYSICAL   PATIENT'S PMD:  Sherrilee Gilles. Gerarda Fraction, MD with [facility].   ADMISSION DIAGNOSIS:  1. Hypoglycemia.  2. Insulin-dependent diabetes.  3. Hypertension.  4. History of coronary artery disease.   CHIEF COMPLAINT:  Low blood sugar.   HISTORY OF PRESENT ILLNESS:  The patient is a 70 year old Caucasian  female who has a history of insulin-dependent diabetes, hypertension,  and coronary artery disease who was actually here in the ED last night  with retrosternal chest pain.  She received 2 doses of nitroglycerin,  and then had complete resolution of the pain.  She was asked to followup  with her PMD today.  She went to her doctor's office at about 1:32 and  then she was waiting for doctor to come in, when she apparently passed  out.  She does not recall the events.  EMS was called for hypoglycemia,  and she was apparently given a dose of D50, and was brought into the ED  here.  The patient mentioned that her chest pain, yesterday, started at  rest while she was watching television.  It was retrosternal with  pressure-like sensation.  She had some nausea.  It felt like the pain  was going to her right arm, and she has some sweating.  She did not have  any palpitations or dizziness, or lightheadedness.  Then after she was  given nitroglycerin in the ED The pain disappeared.   She stated that she has never had hypoglycemia before.  She did,  however, mentioned that today she had her breakfast later than usual at  about 10:30, and then he did not have a lunch that she usually has about  1:30 or 2 o'clock.   MEDICATIONS AT HOME:  1. She is on NovoLog 70/30, 80 units b.i.d.  2. Diovan 116 mg daily.  3. Omeprazole 30 mg daily.  4. Premarin 0.25 mg daily.  5. Gabapentin 300 mg daily.  6. Aspirin 325 mg daily.  7. Nitrofurantoin 50 mg daily.  8. Ambien 10 mg as needed.  9. Nadolol 20 mg b.i.d.  10.Vytorin 10/40 one tablet daily.  She did take a dose of insulin this morning 80 units maybe about 9:30 or  10 o'clock in the morning.   ALLERGIES:  No known drug allergies.   PAST MEDICAL HISTORY:  Positive for nonalcoholic liver cirrhosis.  She  follows with Dr. Laural Golden.  She underwent a colonoscopy and an EGD in 2008  which revealed diaphoresis and some polyps.  She also has a history of  insulin-dependent diabetes, dyslipidemia, and coronary artery disease.  She had an MI in 1995.  She had a cath done in 1997, and then later--it  looks like  she had one in November 2007; and she did not require further  intervention.  When she had the MI she required a stent placement to the  RCA.  She also a history of GERD, anemia, rectal bleeding.  She has had  a ruptured disk at L5-S1 for which she required surgery.  She has had  cataract surgeries, appendectomy, D&C's, and a vaginal hysterectomy.   SOCIAL HISTORY:  She lives in [place] with her husband.  She is  retired from the health care profession.  No smoking or alcohol use.  She is independent with daily activities.   FAMILY HISTORY:  None according to the patient.   REVIEW OF SYSTEMS:  HEENT: Unremarkable.  CARDIOVASCULAR: As in HPI.  RESPIRATORY:  As in HPI.  ENDOCRINE:  As in HPI.  GASTROINTESTINAL:  Unremarkable.  GENITOURINARY: Unremarkable.  MUSCULOSKELETAL:  Unremarkable.  NEUROLOGIC:  Unremarkable.  PSYCHIATRIC:  Unremarkable.   PHYSICAL EXAMINATION:  VITAL SIGNS: Temperature 97.4, blood pressure  142/49, respiratory rate 20, saturation 99% on room air, heart rate 80.  GENERAL EXAM:  A well-developed well-nourished white female in no  distress.  HEENT:   There is no pallor, no icterus.  Oral mucous membranes are  moist.  No oral lesions are noted.  NECK:  Soft and supple.  No thyromegaly is appreciated.  LUNGS:  Clear to auscultation bilaterally.  No wheezing, rales, or  rhonchi.  CARDIOVASCULAR:  Heart is normal regular.  There is a systolic murmur  appreciated over the precordium.  ABDOMEN:  Soft, nontender, nondistended.  No mass or organomegaly  appreciated.  EXTREMITIES: Do not show any edema.  Peripheral pulses are palpable.  NEUROLOGICALLY: No focal deficits are present.   LABS:  White count 13,500 with 79% neutrophils.  The rest of the labs  are within normal limits.   ASSESSMENT:  This is a 70 year old Caucasian female who has insulin-  dependent diabetes apart from other medical issues who had a syncopal  episode and was found to have a blood glucose of 18.  Hypoglycemia is  the most likely reason for her syncope.  She does not have any other  focal deficits.  The reason for her hypoglycemia is her somewhat of  erratic food intake, and lack of lunch today.  Liver cirrhosis could be  playing a role, but less likely.  She also had an episode of chest pain  last night that sounds like it was angina.  However, she had an almost  normal catheterization 2-1/2 years ago, at which point, also apparently  she was having chest pain.   PLAN:  1. Hypoglycemia.  We will keep her on the D5 drip now.  Blood sugars      in the ED have been fluctuating.  We will observe her in the      hospital overnight.  I will give her D10 if needed.  Hold her      insulin dose today.  I will check her HbA1c as well.  2. Chest pain.  I will check 1 cardiac marker.  She has not had any      more episodes of chest pain since last night.  If the cardiac      enzymes are negative, we will just have her follow up with      [facility], and we will be happy to make that      appointment for her.  3. Hypertension.  Continue her home  medications.  4. Liver cirrhosis stable.  5. Coronary artery disease.  Continue aspirin, but otherwise stable.      Wonder why she is not on a beta-blocker.  We will defer to her PMD.  6. I also discussed the use of Premarin.  She has mentioned that she      had been on it for more than 30 years, ever since she had a      hysterectomy.  I have told her that long-term use of oral steroids      including contraceptive and hormonal pills are not recommended.  I      have encouraged her to talk to her PMD about this.  7. She also has history of chronic urinary tract infections and she is      on nitrofurantoin.  8. The reason for her leukocytosis is not clear.  She is afebrile.  We      will repeat her labs tomorrow.   We anticipate patient staying overnight, and if her blood sugars  stabilize, we will discharge her tomorrow morning.      Bonnielee Haff, MD  Electronically Signed     GK/MEDQ  D:  01/26/2008  T:  01/26/2008  Job:  [id number]   cc:   Sherrilee Gilles. Gerarda Fraction, MD  Fax: [phone]

## 2011-01-24 NOTE — Op Note (Signed)
NAMECOURNEY, [name]                ACCOUNT NO.:  [id number]   MEDICAL RECORD NO.:  [id number]          PATIENT TYPE:  AMB   LOCATION:  DAY                           FACILITY:  [facility]   PHYSICIAN:  Hildred Laser, M.D.    DATE OF BIRTH:  [birth year]   DATE OF PROCEDURE:  12/18/2006  DATE OF DISCHARGE:                               OPERATIVE REPORT   PROCEDURE:  Esophagogastroduodenoscopy followed by colonoscopy.   INDICATIONS:  [name] is 70 year old Caucasian female with multiple  medical problems who was recently found to be anemic.  She is suspected  to be losing blood from her GI tract.  Because she is on 1 gram of  aspirin every day.  She has not had colonoscopy in over 10 years.  She  also has chronic GERD and maintained on PPI.  Procedure risks were  reviewed with the patient, informed consent was obtained.   MEDS FOR CONSCIOUS SEDATION:  Benzocaine spray for pharyngeal topical  anesthesia, Demerol 50 mg IV, Versed 14 mg IV.   FINDINGS:  Procedure performed in endoscopy suite.  The patient's vital  signs and O2 sat were monitored during procedure and remained stable.   Esophagogastroduodenoscopy.  The patient was placed left lateral  position and Pentax videoscope was passed via oropharynx without any  difficulty into esophagus.   Esophagus. Mucosa of the proximal and middle segment was normal.  Distally she had three columns of esophageal varices, two on the right  several grade 2 and one on the left side was smaller.  Varices were  noted in the distal 8-10 cm esophagus.  Did not have any erosions or  veil markings over these.  GE junction of 36 cm and hiatus was 38.   Stomach.  It was empty and distended very well with insufflation.  Folds  proximal stomach were normal.  Examination mucosa revealed erythema at  antrum with a few erosions and a swollen prepyloric folds.  Pyloric  channel was patent.  Angularis, fundus and cardia examined by  retroflexing the scope and were  normal.   Duodenum. Bulbar mucosa was normal.  Scope was passed second part of  duodenum where mucosa and folds were normal.  Endoscope was withdrawn.  The patient prepared for procedure #2.   Colonoscopy.  Rectal examination performed.  No abnormality noted on  external or digital exam.  Pentax videoscope was placed rectum and  advanced under vision into sigmoid colon.  The fixed noncompliant  sigmoid colon.  Turning the patient to supine position did not help and  finally she was turned right side and I was able to pass the scope into  descending colon beyond.  The patient was turned again on her left side  and scope was advanced into cecum.  The cecum was identified by  ileocecal valve and appendiceal orifice and/or stump.  Pictures taken  for the record.  Overall her prep was fair to satisfactory.  Lot of  stool had to be washed and suctioned out.  As the scope was withdrawn  colonic mucosa was carefully examined.  There were two  small polyps at  ascending colon.  Both of these ablated via cold biopsy and submitted in  separate container.  There was another 5 mL polyp at hepatic flexure  which was ablated in similar fashion.  Mucosa rest of the colon was  normal.  A few diverticula at sigmoid colon.  Rectal mucosa was normal.  There were prominent submucosal rectal veins.  Scope was retroflexed to  examine anorectal junction and was unremarkable.  Endoscope was then  straightened and withdrawn.  The patient tolerated the procedures well.   FINAL DIAGNOSIS:  1. Three columns of grade 2 esophageal varices which are worrisome for      underlying liver disease or cirrhosis.  2. Small sliding hiatal hernia.  3. Erosive antral gastritis.  4. Few diverticula at sigmoid colon.  5. Three small polyps ablated via cold biopsy, two at the ascending      colon and one at hepatic flexure.  These were suspicious for      adenomas.  6. Prominent submucosal rectal veins.   RECOMMENDATIONS:  We  will check her H pylori serology, do iron, TIBC,  ferritin, serum B12, and folate levels as well as hepatitis B surface  antigen and hepatitis C antibody reviewed with recent serum in case  further studies are needed.   Hepatobiliary ultrasound along with a Doppler.   I will be contacting patient with results of these studies and further  recommendations.      Hildred Laser, M.D.  Electronically Signed     NR/MEDQ  D:  12/18/2006  T:  12/18/2006  Job:  [id number]   cc:   Barbara Moat, PA

## 2011-01-24 NOTE — Cardiovascular Report (Signed)
[name], [name] NO.:  [id number]   MEDICAL RECORD NO.:  [id number]          PATIENT TYPE:  OIB   LOCATION:  6501                         FACILITY:  [facility]   PHYSICIAN:  Belva Crome, M.D.   DATE OF BIRTH:  December 19, 1940   DATE OF PROCEDURE:  04/01/2005  DATE OF DISCHARGE:                              CARDIAC CATHETERIZATION   INDICATION FOR STUDY:  Recent episodes of chest discomfort, possibly  ischemic in nature.  The study is being done to rule out progression of  coronary disease.  The patient has a history of prior coronary stent.  We do  not have specific records of exactly where stent implants were.  These were  found by Dr. Roe Rutherford in 1995.   PROCEDURE PERFORMED:  1.  Left heart catheterization.  2.  Selective coronary angiography.  3.  Left ventriculography.   DESCRIPTION:  After informed consent a 5-French sheath was placed in the  right femoral artery using the modified Seldinger technique.  A 5-French #4  right Judkins catheter was used for left heart catheterization, hemodynamic  recordings, left ventriculography by hand injection, and selective right  coronary angiography.  A #4 5-French left coronary catheter was used for  left coronary angiography.  The patient tolerated the procedure without  complications.   RESULTS:   I. HEMODYNAMIC DATA:  A.  Aortic pressure 133/60.  B.  Left ventricular pressure 141/11 mmHg.   II. LEFT VENTRICULOGRAPHY:  The left ventricle is normal in size,  demonstrates normal overall contractility.  EF is greater than 60%.  No  mitral regurgitation is noted.   III. CORONARY ANGIOGRAPHY:  A.  Left main coronary:  Main is widely patent.  There is no significant obstruction noted.  B.  Left anterior descending coronary:  The left anterior descending  coronary artery contains eccentric 25-30% ostial narrowing seen most  prominently on the RAO caudal view.  The LAD is transapical.  There is one  large  diagonal that arises from it.  No significant obstruction noted in the  LAD and in the other orthogonal views no significant obstruction is seen.  C.  Circumflex artery:  Circumflex is large and free of any significant  obstruction.  In the proximal segment there is eccentric 30-40% narrowing.  D.  Ramus intermedius:  There is a moderate-sized ramus intermediate branch  that contains eccentric 50-60% narrowing at its ostium.  E.  Right coronary:  The RCA is large.  With a large PDA and LV branch.  The  mid vessel is tortuous.  There is 30% mid vessel narrowing.   CONCLUSION:  1.  No significant obstructive coronary disease is noted.  There is      eccentric ostial left anterior descending narrowing of up to 25%.  The      mid right coronary artery and mid circumflex each contain 30-40%      narrowing.  The ramus intermedius branch contains a 50-60% ostial      narrowing.  The ramus is a small to moderate-sized vessel.  2.  Normal left ventricular function.  3.  If the patient does have implanted stents they are not obviously present      by cineangiographic fluoroscopy using our current laboratory equipment      in the [facility] laboratory.  Certainly, there are no regions of      significant stenosis and therefore significant in-stent restenosis is      not present.   PLAN:  Clinical follow-up.  Will obtain prior implant notes and document in  the patient's chart where the stents are.  She would need yearly follow-up.      Belva Crome, M.D.  Electronically Signed     HWS/MEDQ  D:  04/01/2005  T:  04/01/2005  Job:  [id number]   cc:   Wenda Low, MD  [address] [place]  [place] [address]  Fax: [phone]

## 2011-01-24 NOTE — Consult Note (Signed)
NAMEDIORA, BELLIZZI                ACCOUNT NO.:  [id number]   MEDICAL RECORD NO.:  [id number]          PATIENT TYPE:  AMB   LOCATION:                                FACILITY:  [facility]   PHYSICIAN:  Hildred Laser, M.D.    DATE OF BIRTH:  1940-10-02   DATE OF CONSULTATION:  10/29/2006  DATE OF DISCHARGE:                                 CONSULTATION   REASON FOR CONSULTATION:  Anemia, colonoscopy.   REFERRING PHYSICIAN:  Donnal Moat, PA with Bonne Dolores, M.D.   HISTORY OF PRESENT ILLNESS:  Ms. Veno is a 70 year old Caucasian female  patient of Dr. Caron Presume who presents today for further evaluation of  anemia and to schedule a colonoscopy.  She states her last colonoscopy  was over 10 years ago with Waverly Ferrari, M.D.  She denies having any  polyps.  She was recently told she was anemic with a hemoglobin of 10.6,  hematocrit 33.6, MCV 81.8.  She reports having three hemoccult cards  done which were negative.  Back in November of 2007, she had a  hemoglobin of 10.1, hematocrit 31.5.  Platelet count is normal at  207,000, white count normal at 8400.  She has a long history of  gastroesophageal reflux disease.  She states that she has to be very  careful at night or she has flare-up of symptoms.  She is on Prevacid 30  mg daily.  She is not sure if she has ever had an upper endoscopy.  She  denies any nausea and vomiting, dysphagia, or odynophagia, abdominal  pain, constipation, diarrhea, melena, or rectal bleeding.   CURRENT MEDICATIONS:  1. Diovan 160 mg daily.  2. Prevacid 30 mg daily.  3. Aspirin 500 mg two tablets daily.  4. Premarin 0.625 mg daily.  5. NovoLog 70/30, 80 units in the morning and 80 units with dinner.  6. Glimepiride 4 mg b.i.d.  7. Toprol XL 50 mg b.i.d.  8. Neurontin 300 mg t.i.d.  9. Metformin 850 mg b.i.d.  10.Nitrofurantoin 100 mg daily.  11.Excedrin PM p.r.n.   ALLERGIES:  No known drug allergies.   PAST MEDICAL HISTORY:  1. Diabetes mellitus.  2.  History of MI, status post stent, last catheterization in November      of 2007 with no significant coronary artery disease.  3. History of chronic GERD.  4. She has had a ruptured disk in her back.  5. Hypercholesterolemia.  6. History of myalgias and paresthesias to the right thigh.  7. She has had a partial hysterectomy, tonsillectomy, myringotomy      tubes as a child.  8. D&C.  9. Appendectomy.  10.Bilateral tubal ligation.  11.Repair of significant anal tear at childbirth.  12.Repair of a ruptured disk.  13.Cardiac catheterization with stent x3.   SOCIAL HISTORY:  She is married and has two sons.  She is retired from  [employer] in [place].  She occasionally smokes when she gets stressed,  not even once a week.  Denies any alcohol use.   REVIEW OF SYSTEMS:  See HPI for GI.  CARDIOPULMONARY:  She denies.  No  chest pain or shortness of breath.   PHYSICAL EXAMINATION:  VITAL SIGNS:  Weight 215.5, height 5 feet 3  inches, temperature 98.8, blood pressure 128/60, pulse 84.  GENERAL:  Pleasant, elderly, Caucasian female in no acute distress.  SKIN:  Warm and dry with no jaundice.  HEENT:  Sclerae anicteric.  Oropharyngeal mucosa moist and pink with no  lesions, erythema, or exudate.  No lymphadenopathy or thyromegaly.  CHEST:  Lungs are clear to auscultation.  HEART:  Regular rate and rhythm.  Normal S1 and S2.  No murmurs, rubs,  or gallops.  ABDOMEN:  Positive bowel sounds, soft, and nondistended.  Mild  epigastric tenderness.  No organomegaly or masses.  No rebound  tenderness or guarding.  No abdominal bruits or hernias.  EXTREMITIES:  Lower extremities with no edema.   IMPRESSION:  Ms. Robicheaux is a 70 year old lady who has a normocytic anemia  dating back at least to November of 2007.  She reports that her stools  are hemoccult negative x3.  It has been over 10 years per her report  since her last colonoscopy.  She has had an anemia profile and we are  trying to retrieve  these results.  I recommend colonoscopy for further  evaluation of anemia.  In addition she has mild epigastric pain and  chronic GERD with nocturnal symptoms and therefore we will go ahead and  pursue upper endoscopy at the same time.  She most likely has some  occult GI bleeding which has not been detected.   PLAN:  1. Colonoscopy with EGD in the near future by Dr. Laural Golden.  She will      hold her aspirin products for 4 days prior      to the procedure.  We will have her take half of her normal dose of      NovoLog, Glimepiride, and Metformin the day of her preparation.  We      will retrieve anemia profile done at her primary care physician's      office recently.  2. She will continue Prevacid 30 mg daily for now.      Neil Crouch, P.A.      Hildred Laser, M.D.  Electronically Signed    LL/MEDQ  D:  10/29/2006  T:  10/29/2006  Job:  [id number]   cc:   Donnal Moat, PA

## 2011-01-24 NOTE — Cardiovascular Report (Signed)
[name], [name]                ACCOUNT NO.:  [id number]   MEDICAL RECORD NO.:  [id number]          PATIENT TYPE:  OBV   LOCATION:  2036                         FACILITY:  [facility]   PHYSICIAN:  Eden Lathe. Einar Gip, MD       DATE OF BIRTH:  12/30/1940   DATE OF PROCEDURE:  08/06/2006  DATE OF DISCHARGE:  08/06/2006                            CARDIAC CATHETERIZATION   PROCEDURE PERFORMED:  1. Left ventriculography.  2. Right and left coronary angiography.  3. Ascending aortogram.  4. Abdominal aortogram.  5. Right femoral angiography and closure of the right femoral arterial      access with StarClose.   INDICATIONS:  [name] is a 70 year old female with a history of  hypertension, diabetes, hyperlipidemia who presented to the [facility]  Emergency Room complaining of chest pain going on for the past three  days.  She was woken up from sleep twice with chest discomfort in the  form of heaviness in the middle of her chest, lasting for over 15-20  minutes.  In the emergency room, her EKG was normal.  Physical  examination had revealed a systolic murmur and her point of care marker  x1 was negative for myocardial infarction.  She had a known history of  stent implantation about 6-7 years ago and has had heart catheterization  about a year to two years ago.  She was transferred to the [facility] for further cardiovascular evaluation for possible cardiac  catheterization given her multiple cardiovascular risk factors and  ongoing chest pain.   Prior to proceeding with heart catheterization, I did evaluate her  previous coronary angiograms done in July of 2006 and this had revealed  at least a 70%, if not more, stenosis of the ostium of a moderate sized  ramus branch.  I suspected that she could potentially have progression  of her coronary disease given her multiple cardiovascular risk factors.  Hence after having discussed alternative risks and benefits for cardiac  catheterization versus Myoview, as per patient preference, she was  brought to the cardiac catheterization lab to evaluate her coronary  anatomy.   Ascending aortogram was performed to evaluate for ascending aortic  aneurysm because of chest pain and abdominal aortogram was performed to  evaluate for abdominal aortic aneurysm and abdominal atherosclerosis and  renal artery stenosis given her hypertension.   HEMODYNAMIC DATA:  The left ventricle had a pressure of 142/2 with an  end diastolic pressure of 9 mmHg.  The aortic pressure was 141/60 with a  mean of 93 mmHg.  There was no pressure gradient across the aortic  valve.   ANGIOGRAPHIC DATA:  Left ventricle:  The left ventricular systolic  function was normal with an ejection fraction of 60-65%.   Right coronary artery:  The right coronary artery is a large caliber  vessel and dominant.  There is mild 10% luminal irregularity in the mid-  segment of the right coronary artery.  Otherwise the RCA is large and  smooth.   Left main:  The left main is a large caliber vessel.  Smooth  and normal.   LAD:  The LAD shows mild calcification of the proximal segment.  Otherwise there is no significant luminal obstruction.  It gives origin  to a very small diagonal one and a moderate sized diagonal two.   The ramus intermedius:  The ramus intermedius is a moderate caliber  vessel.  The ostium shows about 10% mild luminal irregularity. This is  significantly improved compared to cardiac catheterization done a year  ago where it was at least 70%, if not more, stenosed.   Circumflex:  The circumflex is a large caliber vessel.  It is smooth and  has very minimal luminal irregularity.   ASCENDING AORTAGRAM:  The ascending aortogram revealed the presence of  three aortic valve cusps.  There is no aortic ascending aneurysm, aortic  regurgitation or aortic dissection.   ABDOMINAL AORTOGRAM:  Abdominal aortogram revealed the presence of two   renal arteries, one on either side.  They are widely patent.  The left  renal artery showed a mild calcification with around 10% luminal  stenosis but no pressure gradient across the renal artery.   IMPRESSION:  No significant coronary artery disease by cardiac  catheterization.  Actually the ramus intermedius, which had at least a  70% stenosis a year ago now appears to have less than 10-20% stenosis at  most and there is significant regression of atherosclerotic plaque with  aggressive medical therapy.  Overall I suspect that the chest pain is  secondary to non cardiac causes, probably suspect GERD, given the fact  that she has recurrent chest discomfort in lying down position.  She  also has body habitus with obesity and GERD should be a primary  consideration.  She will be discharged home today with PPI; however, if  the chest pain persists, evaluation for non cardiac cause of chest pain  is indicated.   A total of 120 mL of contrast was utilized for diagnostic angiography.   TECHNIQUES OF THE PROCEDURE:  Under the usual sterile precautions, using  a 6-French right femoral venous access and a 6-French multipurpose B2  catheter was advanced to the ascending aorta, a 0.025 JR.  The catheter  was gently advanced to the left ventricle.  The left ventricular  pressure monitored.  Contrast was injected in the left ventricle  performed in both the LAO and the RAO projection.  The catheter was  flushed and gently pulled back.  The pressure gradient across the aortic  valve was monitored.  Then the right coronary artery was selectively  engaged and angiography was performed.  Then the left coronary artery  was selectively engaged and angiography was performed.  Then the  catheter was pulled into the root of the aorta and ascending aortogram  was performed in the LAO projection.  Then the catheter was pulled back into the abdominal aorta and abdominal aortogram was performed.  Right  femoral  angiography was performed through the arterial access sheath and  the access was closed with StarClose with excellent hemostasis.  The  patient tolerated the procedure.  No immediate complications were noted.      Eden Lathe. Einar Gip, MD  Electronically Signed     JRG/MEDQ  D:  08/06/2006  T:  08/07/2006  Job:  [id number]   cc:   Leonides Grills, M.D.

## 2011-01-24 NOTE — Discharge Summary (Signed)
[name], [name]                ACCOUNT NO.:  [id number]   MEDICAL RECORD NO.:  [id number]          PATIENT TYPE:  OBV   LOCATION:  2036                         FACILITY:  [facility]   PHYSICIAN:  Barbara Lathe. Einar Gip, MD       DATE OF BIRTH:  Dec 22, 1940   DATE OF ADMISSION:  08/06/2006  DATE OF DISCHARGE:  08/06/2006                               DISCHARGE SUMMARY   DISCHARGE DIAGNOSES:  1. Chest pain, etiology undetermined, suspect gastroesophageal reflux      disease.  2. Known history of status post coronary angiography.  No critical      coronary artery disease per catheterization.  3. Known coronary artery disease with previous history of myocardial      infarction per patient.  4. Diabetes mellitus.  5. Hyperlipidemia.  6. Status post appendectomy.  7. Remotely, status post lumbar spine surgery.   HISTORY OF PRESENT ILLNESS:  This is a 70 year old Caucasian female who  presented to [facility] today with complaints of chest pain.  Onset of chest pain actually was on Monday, but pain was really not  severe and patient did not address the issue right away.  On Tuesday,  she felt better, but then on Wednesday the pain was experienced again.  The mild pain was experienced during the day, but she was awakened up at  night, but she was awakened up twice last night; the first time at 2:30  a.m. and the second time at 5:30 a.m., and that time she was complaining  of being unable to lie down and the pain would get better while she was  in a sitting position.   This morning when she woke up, she again felt pain and some mid-chest  pressure that was concerning to the patient and she presented to the  emergency room at [facility].  She was evaluated there and  transferred to [facility] for further evaluation.  EKG did not  show any ST-T wave abnormalities.  Her cardiac care makers were  negative, renal function normal.  Patient was assessed by Dr. Einar [name], and  the  decision was made to proceed with coronary angiography.   The coronary angiography was performed by Dr. Einar [name] on August 06, 2006.  For full description of this procedure, please refer to the  dictated note.  Briefly, her EF was estimated at 60%.  She did have some  proximal LAD, mildly calcified plaque.  Ramus intermedius also had an  ostial lesion of 10%.  There was some RCA irregularity that would  account probably for 10% of the stenosis also.  There was no dissection,  no abdominal aortic aneurysm, no aortic insufficiency noted.  Her left  renal artery revealed 10% stenosis.  The groin was Perclose with  StarClose device and patient was placed on 2-hour bedrest.   HOSPITAL LABORATORIES:  As I mentioned above, cardiac markers were  negative.  Hemoglobin 10.1, hematocrit 31.5, platelet count 195,000,  white blood cell count 6.5.  Sodium 138, potassium 4.1, chloride 106,  CO2 of 22, BUN 11, creatinine 0.6,  glucose 139, calcium 8.4.   Patient will be discharged home if her groin remains stable, after  ambulation, when her bedrest expires.   MEDICATIONS:  1. Diovan 80 mg daily.  2. Toprol-XL 50 mg daily.  3. Prevacid 30 mg daily.  4. Glimepiride 4 mg daily.  5. Metformin was on hold.  6. Aspirin 325 mg daily.  7. NovoLog 70 + 40, 8 units q.12h., subcutaneous injections.  8. Lipitor 20 mg daily.  9. Neurontin 300 mg daily.   Patient was instructed to stop metformin for 48 hours post-cath.  She  can take it Saturday evening with her dose.   SPECIFIC INSTRUCTIONS:  No driving for 3 days and avoid lifting greater  than 5 pounds for 3 days.  She is to stay on a low-salt, low-fat, and  low-cholesterol diet.  Patient was instructed to do activity slowly and  to not take a bath but she can take a shower and needs to pat-dry the  groin puncture site, avoiding rubbing.   DISCHARGE FOLLOWUP:  She will see Dr. Orson Ape, her primary care  physician, in 2-3 weeks after  discharge.      York Grice, P.A.      Barbara Lathe. Einar Gip, MD  Electronically Signed    MK/MEDQ  D:  08/06/2006  T:  08/07/2006  Job:  [id number]   cc:   [facility]  [place] ___  Leonides Grills, M.D.

## 2011-01-24 NOTE — Cardiovascular Report (Signed)
[name], [name] NO.:  [id number]   MEDICAL RECORD NO.:  [id number]          PATIENT TYPE:  OIB   LOCATION:  6501                         FACILITY:  [facility]   PHYSICIAN:  Belva Crome, M.D.   DATE OF BIRTH:  [birth year]   DATE OF PROCEDURE:  DATE OF DISCHARGE:  04/01/2005                              CARDIAC CATHETERIZATION   ADDENDUM:  [name] has a history of coronary artery disease and had right  coronary stent implants in 1995. She has done well since that time. These  stents were placed in an overlapping fashion in the proximal right coronary  artery. This note is for clarification since this was not mentioned in the  initial catheterization report.      Belva Crome, M.D.  Electronically Signed     HWS/MEDQ  D:  04/02/2005  T:  04/02/2005  Job:  [id number]

## 2011-04-14 IMAGING — CR DG LUMBAR SPINE COMPLETE 4+V
5 series · 5 of 5 positions shown · non-contrast
Comparison: None.

CLINICAL DATA: Back pain.

LUMBAR SPINE - COMPLETE 4+ VIEW

[view not recorded (1 of 5)]
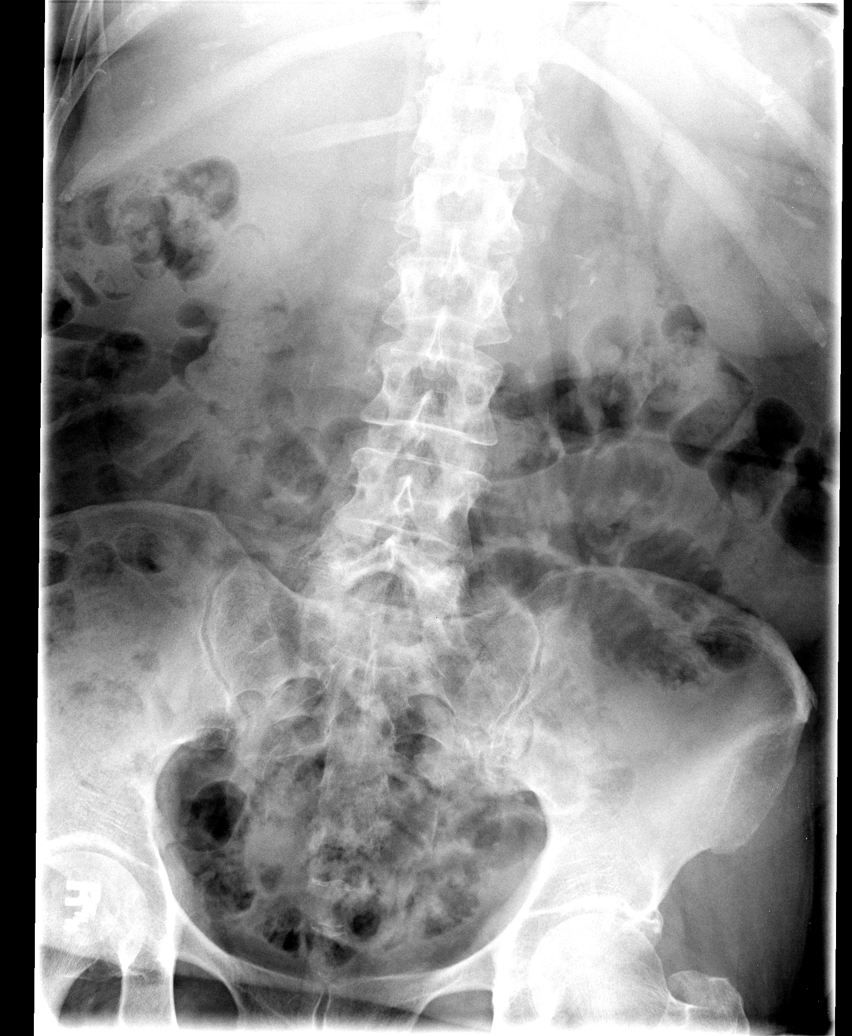

[view not recorded (2 of 5)]
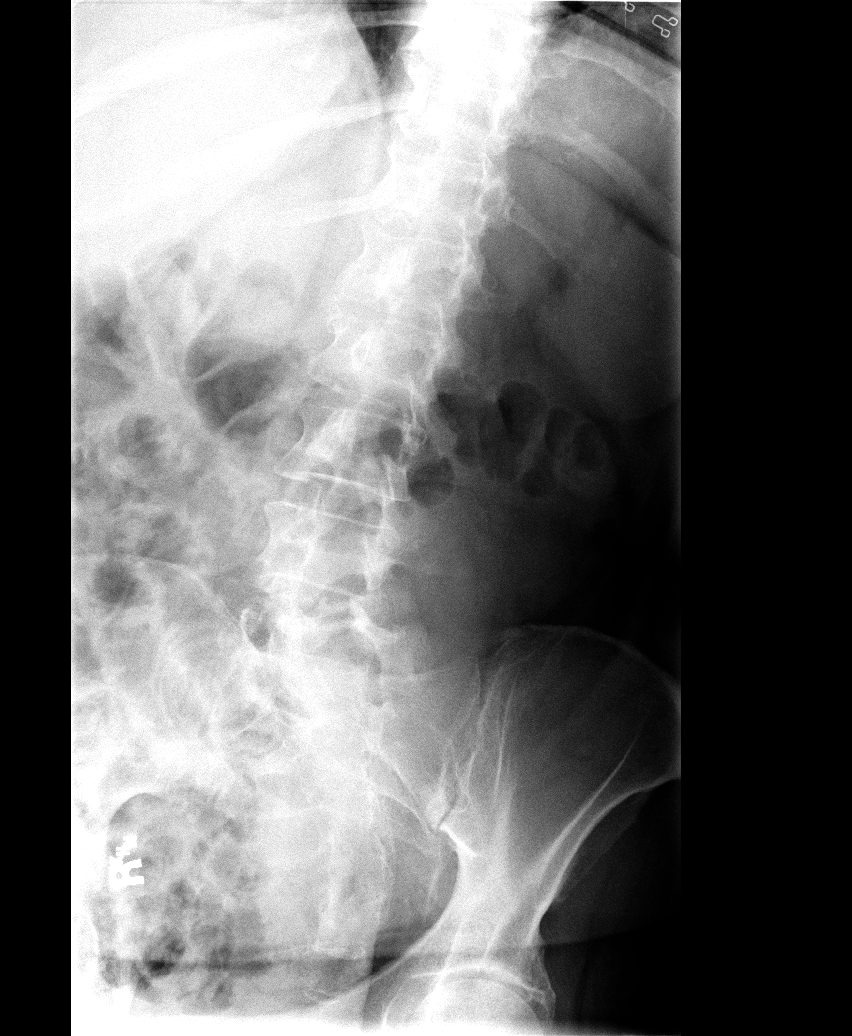

[view not recorded (3 of 5)]
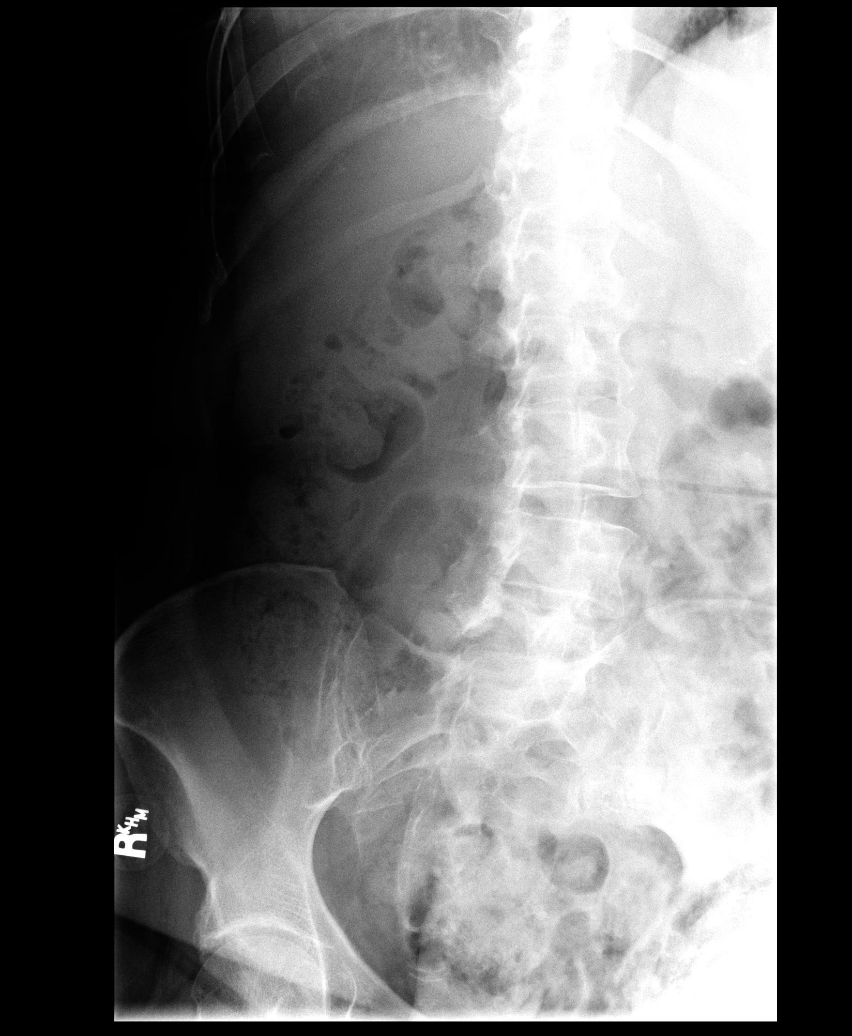

[view not recorded (4 of 5)]
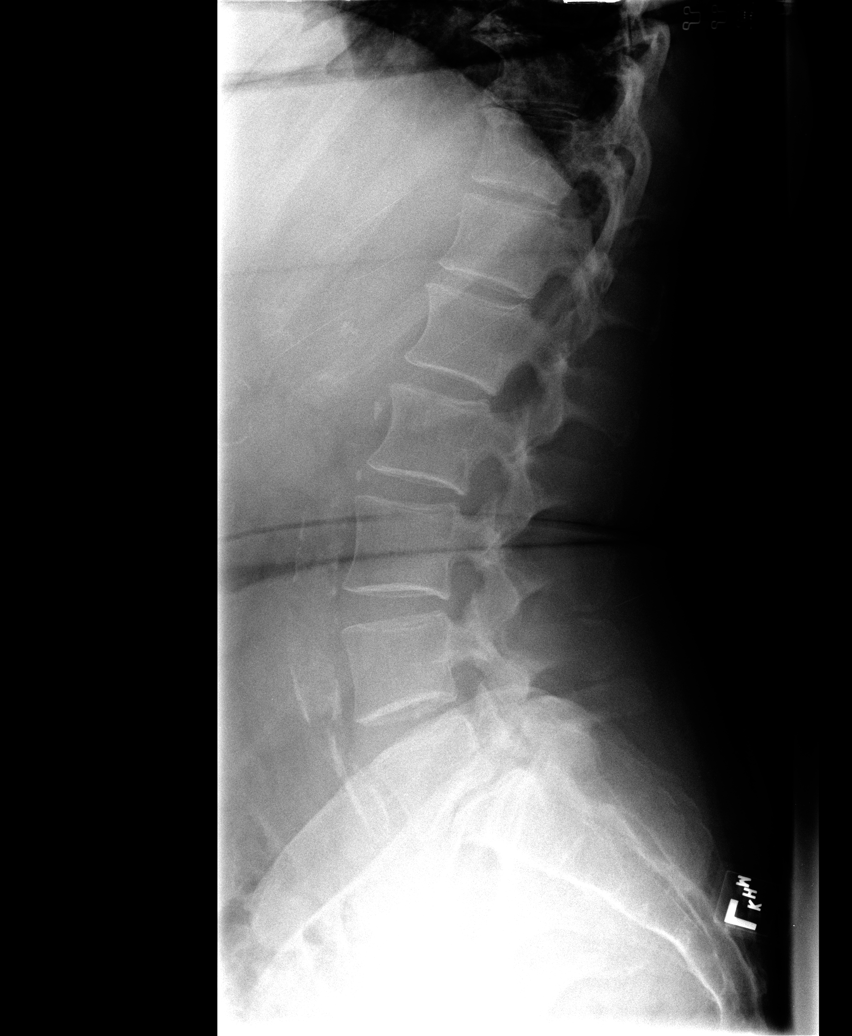

[view not recorded (5 of 5)]
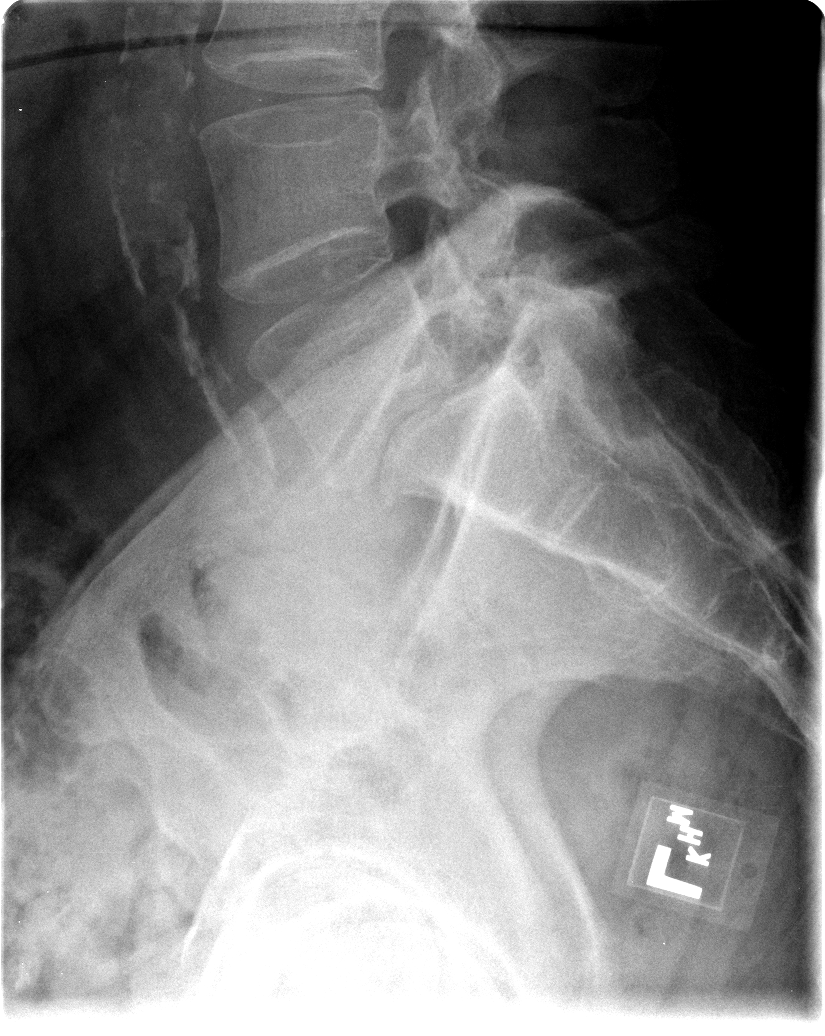

[5 of 5 positions shown; findings below may reference images not displayed]

FINDINGS: There is no evidence of acute fracture.  Degenerative
changes are noted at L5-S1 where there is 10 mm of anterolisthesis
of L5 on S1.  Remaining disc spaces are fairly well preserved.
Osteoarthritis is  noted at the left greater than right hips.
Atherosclerotic calcifications are seen in the abdominal aorta.
Vascular calcifications  are also seen in the upper abdomen.  The
soft tissues are within normal limits.
IMPRESSION: Degenerative changes at L5-S1 as detailed above.  No
fracture.

## 2011-06-04 LAB — CK TOTAL AND CKMB (NOT AT ARMC)
CK, MB: 3.3
CK, MB: 3.5
Relative Index: INVALID
Total CK: 85
Total CK: 88

## 2011-06-04 LAB — DIFFERENTIAL
Eosinophils Absolute: 0.3
Eosinophils Absolute: 0.3
Eosinophils Absolute: 0.4
Eosinophils Relative: 2
Lymphocytes Relative: 13
Lymphocytes Relative: 24
Lymphs Abs: 1.8
Lymphs Abs: 2.8
Lymphs Abs: 3.1
Monocytes Absolute: 0.8
Monocytes Relative: 6
Monocytes Relative: 6
Monocytes Relative: 7
Neutro Abs: 10.6 — ABNORMAL HIGH
Neutrophils Relative %: 67
Neutrophils Relative %: 79 — ABNORMAL HIGH

## 2011-06-04 LAB — BASIC METABOLIC PANEL
BUN: 13
BUN: 21
CO2: 26
Calcium: 9.1
Calcium: 9.9
Chloride: 105
Chloride: 106
Creatinine, Ser: 0.73
GFR calc Af Amer: >60
GFR calc non Af Amer: 59 — ABNORMAL LOW
GFR calc non Af Amer: >60
Glucose, Bld: 69 — ABNORMAL LOW
Potassium: 3.8
Potassium: 3.9
Sodium: 141

## 2011-06-04 LAB — CBC
HCT: 38.6
Hemoglobin: 13.8
MCV: 93.6
Platelets: 142 — ABNORMAL LOW
Platelets: 170
RBC: 4.28
RBC: 4.3
WBC: 11.1 — ABNORMAL HIGH
WBC: 12 — ABNORMAL HIGH
WBC: 13.5 — ABNORMAL HIGH

## 2011-06-04 LAB — TROPONIN I
Troponin I: 0.04
Troponin I: 0.04

## 2011-06-04 LAB — POCT CARDIAC MARKERS
CKMB, poc: 2.7
Myoglobin, poc: 69.4
Operator id: 237661
Troponin i, poc: <0.05

## 2011-06-04 LAB — HEMOGLOBIN A1C: Hgb A1c MFr Bld: 6.4 — ABNORMAL HIGH

## 2011-06-17 LAB — BASIC METABOLIC PANEL
BUN: 10
CO2: 26
Chloride: 105
Glucose, Bld: 93
Potassium: 4.5

## 2011-06-17 LAB — CBC
HCT: 34.5 — ABNORMAL LOW
MCHC: 32.9
MCV: 85
Platelets: 162
RDW: 15.5

## 2011-06-20 LAB — BASIC METABOLIC PANEL
BUN: 11
CO2: 24
Chloride: 108
Creatinine, Ser: 0.51
Potassium: 3.7

## 2011-07-03 ENCOUNTER — Encounter: Payer: Self-pay | Admitting: Emergency Medicine

## 2011-07-03 ENCOUNTER — Other Ambulatory Visit: Payer: Self-pay

## 2011-07-03 ENCOUNTER — Inpatient Hospital Stay (HOSPITAL_COMMUNITY)
Admission: EM | Admit: 2011-07-03 | Discharge: 2011-07-04 | DRG: 313 | Disposition: A | Discharge status: Home / Self Care | Payer: Medicare Other | Attending: Internal Medicine | Admitting: Internal Medicine

## 2011-07-03 ENCOUNTER — Emergency Department (HOSPITAL_COMMUNITY): Payer: Medicare Other

## 2011-07-03 DIAGNOSIS — K7460 Unspecified cirrhosis of liver: Secondary | ICD-10-CM | POA: Diagnosis present

## 2011-07-03 DIAGNOSIS — R791 Abnormal coagulation profile: Secondary | ICD-10-CM | POA: Diagnosis present

## 2011-07-03 DIAGNOSIS — Z9861 Coronary angioplasty status: Secondary | ICD-10-CM

## 2011-07-03 DIAGNOSIS — R079 Chest pain, unspecified: Secondary | ICD-10-CM | POA: Diagnosis present

## 2011-07-03 DIAGNOSIS — K219 Gastro-esophageal reflux disease without esophagitis: Secondary | ICD-10-CM | POA: Diagnosis present

## 2011-07-03 DIAGNOSIS — R0789 Other chest pain: Principal | ICD-10-CM | POA: Diagnosis present

## 2011-07-03 DIAGNOSIS — E119 Type 2 diabetes mellitus without complications: Secondary | ICD-10-CM | POA: Diagnosis present

## 2011-07-03 DIAGNOSIS — I2510 Atherosclerotic heart disease of native coronary artery without angina pectoris: Secondary | ICD-10-CM | POA: Diagnosis present

## 2011-07-03 DIAGNOSIS — I10 Essential (primary) hypertension: Secondary | ICD-10-CM

## 2011-07-03 DIAGNOSIS — E7800 Pure hypercholesterolemia, unspecified: Secondary | ICD-10-CM | POA: Diagnosis present

## 2011-07-03 HISTORY — DX: Anemia, unspecified: D64.9

## 2011-07-03 HISTORY — DX: Dorsalgia, unspecified: M54.9

## 2011-07-03 HISTORY — DX: Occlusion and stenosis of bilateral carotid arteries: I65.23

## 2011-07-03 HISTORY — DX: Pure hypercholesterolemia, unspecified: E78.00

## 2011-07-03 HISTORY — DX: Unspecified cirrhosis of liver: K74.60

## 2011-07-03 HISTORY — DX: Obstructive sleep apnea (adult) (pediatric): G47.33

## 2011-07-03 HISTORY — DX: Gastro-esophageal reflux disease without esophagitis: K21.9

## 2011-07-03 HISTORY — DX: Nonrheumatic aortic (valve) insufficiency: I35.1

## 2011-07-03 LAB — CBC
HCT: 36.5 % (ref 36.0–46.0)
Hemoglobin: 12.1 g/dL (ref 12.0–15.0)
MCH: 29.9 pg (ref 26.0–34.0)
MCHC: 33.2 g/dL (ref 30.0–36.0)
RBC: 4.05 MIL/uL (ref 3.87–5.11)

## 2011-07-03 LAB — CARDIAC PANEL(CRET KIN+CKTOT+MB+TROPI)
CK, MB: 3.8 ng/mL (ref 0.3–4.0)
Troponin I: <0.3 ng/mL (ref <0.30)

## 2011-07-03 LAB — BASIC METABOLIC PANEL
BUN: 17 mg/dL (ref 6–23)
CO2: 27 mEq/L (ref 19–32)
Calcium: 9.8 mg/dL (ref 8.4–10.5)
GFR calc non Af Amer: 87 mL/min — ABNORMAL LOW (ref >90)
Glucose, Bld: 196 mg/dL — ABNORMAL HIGH (ref 70–99)
Potassium: 3.6 mEq/L (ref 3.5–5.1)

## 2011-07-03 LAB — GLUCOSE, CAPILLARY: Glucose-Capillary: 136 mg/dL — ABNORMAL HIGH (ref 70–99)

## 2011-07-03 MED ORDER — ZOLPIDEM TARTRATE 5 MG PO TABS
5.0000 mg | ORAL_TABLET | Freq: Every evening | ORAL | Status: DC | PRN
  Administered 2011-07-03: 5 mg via ORAL
  Filled 2011-07-03: qty 1

## 2011-07-03 MED ORDER — ACETAMINOPHEN 325 MG PO TABS: 650.0000 mg | ORAL_TABLET | Freq: Four times a day (QID) | ORAL | Status: DC | PRN

## 2011-07-03 MED ORDER — SENNA 8.6 MG PO TABS: 2.0000 | ORAL_TABLET | Freq: Every day | ORAL | Status: DC | PRN

## 2011-07-03 MED ORDER — DOCUSATE SODIUM 100 MG PO CAPS
100.0000 mg | ORAL_CAPSULE | Freq: Two times a day (BID) | ORAL | Status: DC
  Administered 2011-07-03 – 2011-07-04 (×2): 100 mg via ORAL
  Filled 2011-07-03 (×2): qty 1

## 2011-07-03 MED ORDER — ENOXAPARIN SODIUM 40 MG/0.4ML ~~LOC~~ SOLN
40.0000 mg | SUBCUTANEOUS | Status: DC
  Administered 2011-07-03: 40 mg via SUBCUTANEOUS
  Filled 2011-07-03: qty 0.4

## 2011-07-03 MED ORDER — ACETAMINOPHEN 650 MG RE SUPP: 650.0000 mg | Freq: Four times a day (QID) | RECTAL | Status: DC | PRN

## 2011-07-03 MED ORDER — ALBUTEROL SULFATE (5 MG/ML) 0.5% IN NEBU: 2.5000 mg | INHALATION_SOLUTION | RESPIRATORY_TRACT | Status: DC | PRN

## 2011-07-03 MED ORDER — INSULIN ASPART 100 UNIT/ML ~~LOC~~ SOLN: 0.0000 [IU] | Freq: Every day | SUBCUTANEOUS | Status: DC

## 2011-07-03 MED ORDER — SODIUM CHLORIDE 0.9 % IJ SOLN
3.0000 mL | Freq: Two times a day (BID) | INTRAMUSCULAR | Status: DC
  Administered 2011-07-04: 3 mL via INTRAVENOUS
  Filled 2011-07-03 (×2): qty 3

## 2011-07-03 MED ORDER — MORPHINE SULFATE 2 MG/ML IJ SOLN: 2.0000 mg | INTRAMUSCULAR | Status: DC | PRN

## 2011-07-03 MED ORDER — OXYCODONE HCL 5 MG PO TABS: 5.0000 mg | ORAL_TABLET | ORAL | Status: DC | PRN

## 2011-07-03 MED ORDER — INSULIN ASPART 100 UNIT/ML ~~LOC~~ SOLN
0.0000 [IU] | Freq: Three times a day (TID) | SUBCUTANEOUS | Status: DC
  Administered 2011-07-04: 2 [IU] via SUBCUTANEOUS
  Administered 2011-07-04: 5 [IU] via SUBCUTANEOUS
  Filled 2011-07-03: qty 3

## 2011-07-03 MED ORDER — NITROGLYCERIN 0.4 MG SL SUBL
0.4000 mg | SUBLINGUAL_TABLET | Freq: Once | SUBLINGUAL | Status: AC
  Administered 2011-07-03: 0.4 mg via SUBLINGUAL

## 2011-07-03 MED ORDER — ONDANSETRON HCL 4 MG PO TABS: 4.0000 mg | ORAL_TABLET | Freq: Four times a day (QID) | ORAL | Status: DC | PRN

## 2011-07-03 MED ORDER — ONDANSETRON HCL 4 MG/2ML IJ SOLN: 4.0000 mg | Freq: Four times a day (QID) | INTRAMUSCULAR | Status: DC | PRN

## 2011-07-03 NOTE — ED Provider Notes (Signed)
History     CSN: [id number] Arrival date & time: 07/03/2011  4:56 PM   First MD Initiated Contact with Patient 07/03/11 1740      Chief Complaint  Patient presents with  . Chest Pain  . Shoulder Pain    (Consider location/radiation/quality/duration/timing/severity/associated sxs/prior treatment) Patient is a 70 y.o. female presenting with chest pain and shoulder pain. The history is provided by the patient.  Chest Pain The chest pain began 6 - 12 hours ago. The chest pain is improving. Primary symptoms include cough, nausea and vomiting. Pertinent negatives for primary symptoms include no shortness of breath and no abdominal pain.  Pertinent negatives for associated symptoms include no numbness and no weakness.    Shoulder Pain Associated symptoms include chest pain. Pertinent negatives include no abdominal pain, no headaches and no shortness of breath.   patient has had chest pain since 11 AM today and radiates to her left shoulder. She states it has improved since she got here but has not resolved. She states she had a little bit nauseous this and vomited. She states that he does feel like her previous MI. She has not taken anything to help it. She states that she has had a negative stress test within the last 6 months. She had an MI back in the mid 25s. Occasional cough minimal dyspnea. No fevers. No weight loss.  Past Medical History  Diagnosis Date  . Back pain   . GERD (gastroesophageal reflux disease)   . MI (myocardial infarction)   . Hypercholesteremia   . Diabetes mellitus   . Anemia   . Cirrhosis     Past Surgical History  Procedure Date  . Eye surgery   . Back surgery   . Appendectomy   . Cardiac surgery     History reviewed. No pertinent family history.  History  Substance Use Topics  . Smoking status: Not on file  . Smokeless tobacco: Not on file  . Alcohol Use: No    OB History    Grav Para Term Preterm Abortions TAB SAB Ect Mult Living          Review of Systems  Constitutional: Negative for activity change and appetite change.  HENT: Negative for neck stiffness.   Eyes: Negative for pain.  Respiratory: Positive for cough. Negative for chest tightness and shortness of breath.   Cardiovascular: Positive for chest pain. Negative for leg swelling.  Gastrointestinal: Positive for nausea and vomiting. Negative for abdominal pain and diarrhea.  Genitourinary: Negative for flank pain.  Musculoskeletal: Negative for back pain.  Skin: Negative for rash.  Neurological: Negative for weakness, numbness and headaches.  Psychiatric/Behavioral: Negative for behavioral problems.    Allergies  Review of patient's allergies indicates no known allergies.  Home Medications  No current outpatient prescriptions on file.  BP 143/52  Pulse 84  Temp(Src) 98.3 F (36.8 C) (Oral)  Resp 20  Ht 5' 3"  (1.6 m)  Wt 208 lb (94.348 kg)  BMI 36.85 kg/m2  SpO2 98%  Physical Exam  Nursing note and vitals reviewed. Constitutional: She is oriented to person, place, and time. She appears well-developed and well-nourished.  HENT:  Head: Normocephalic and atraumatic.  Neck: Normal range of motion.  Cardiovascular: Normal rate, regular rhythm and normal heart sounds.   No murmur heard. Pulmonary/Chest: Effort normal and breath sounds normal. No respiratory distress. She has no wheezes. She has no rales.  Abdominal: Soft. Bowel sounds are normal. She exhibits no distension. There is no  tenderness. There is no rebound and no guarding.  Musculoskeletal: Normal range of motion.  Neurological: She is alert and oriented to person, place, and time. No cranial nerve deficit.  Skin: Skin is warm and dry.  Psychiatric: She has a normal mood and affect. Her speech is normal.    ED Course  Procedures (including critical care time)   Labs Reviewed  CBC  BASIC METABOLIC PANEL  CARDIAC PANEL(CRET KIN+CKTOT+MB+TROPI)   No results found.   No  diagnosis found.   Date: 07/03/2011  Rate: 85  Rhythm: normal sinus rhythm  QRS Axis: normal  Intervals: normal  ST/T Wave abnormalities: normal  Conduction Disutrbances:none  Narrative Interpretation:   Old EKG Reviewed: none available    MDM  Chest pain. Radiates to her left shoulder. It feels like her previous coronary disease. She had heart catheter in 1996. The required a stent. Since then she has had negative cast negative stress test. She is pain-free after nitroglycerin. She will be admitted hospital for a cardiac rule out.       Jasper Riling. Alvino Chapel, MD 07/03/11 2130

## 2011-07-03 NOTE — ED Notes (Signed)
Waiting for room assignment

## 2011-07-03 NOTE — H&P (Signed)
[name] is an 70 y.o. female.    PCP: Leonides Grills, MD Her cardiologist is with [facility]. Heart and vascular. It is Dr. Debara Pickett. Her gastroenterologist is Dr. Laural Golden.  Chief Complaint: Chest pain  HPI: This is 70 year old, Caucasian female, who was in her usual state of health about 11 AM this morning when she was sitting watching TV when she started having retrosternal chest pressure. It was a squeezing-like sensation. She had lunch, because she thought that this was indigestion. After that the chest pain started getting worse. At its worst it was 5/10 in intensity. Started radiating to both shoulders. Denies any radiation to the neck or jaw. Denies any shortness of breath, fever, or cough. She did have nausea and threw up once. Denies any leg swelling. She does have chronic dizziness, however, denied any such episodes today with her chest pain. She was given nitroglycerin sublingually with, which the pain resolved. She currently is chest pain-free. She tells me that her MI in 1995. She had a stent placed to her RCA. She's had a stress test done about a year and half ago, which was unremarkable. She had her last cardiac catheter done in 2006, which was negative per the patient.   Prior to Admission medications   Medication Sig Start Date End Date Taking? Authorizing Provider  aspirin EC 81 MG tablet Take 81 mg by mouth daily.     Yes Historical Provider, MD  carvedilol (COREG) 6.25 MG tablet Take 6.25 mg by mouth 2 (two) times daily with a meal.     Yes Historical Provider, MD  diphenhydramine-acetaminophen (TYLENOL PM) 25-500 MG TABS Take 1 tablet by mouth at bedtime as needed. For sleep    Yes Historical Provider, MD  insulin glargine (LANTUS SOLOSTAR) 100 UNIT/ML injection Inject 35 Units into the skin at bedtime.    Yes Historical Provider, MD  insulin NPH-insulin regular (NOVOLIN 70/30) (70-30) 100 UNIT/ML injection Inject 60 Units into the skin 2 (two) times daily with a meal.     Yes Historical Provider, MD  isosorbide mononitrate (IMDUR) 30 MG 24 hr tablet Take 30 mg by mouth daily.     Yes Historical Provider, MD  losartan-hydrochlorothiazide (HYZAAR) 50-12.5 MG per tablet Take 1 tablet by mouth daily.     Yes Historical Provider, MD  naproxen sodium (ANAPROX) 220 MG tablet Take 220-440 mg by mouth 2 (two) times daily as needed. For hand pain    Yes Historical Provider, MD  nitrofurantoin (MACRODANTIN) 50 MG capsule Take 50 mg by mouth daily.     Yes Historical Provider, MD  omeprazole (PRILOSEC) 20 MG capsule Take 20 mg by mouth daily.     Yes Historical Provider, MD  rosuvastatin (CRESTOR) 10 MG tablet Take 10 mg by mouth at bedtime.     Yes Historical Provider, MD  temazepam (RESTORIL) 15 MG capsule Take 15 mg by mouth at bedtime.     Yes Historical Provider, MD    Allergies:  Allergies  Allergen Reactions  . Tape Rash    ADHESIVE TAPE    Past Medical History  Diagnosis Date  . Back pain   . GERD (gastroesophageal reflux disease)   . MI (myocardial infarction)   . Hypercholesteremia   . Diabetes mellitus   . Anemia   . Cirrhosis    There is a history of a ruptured disc in 1985. History of anemia is also present. She had rectal bleeding for 3 days in 2007, thought to be secondary to  internal hemorrhoids.  Past Surgical History  Procedure Date  . Eye surgery   . Back surgery   . Appendectomy   . Cardiac catheterization    She's had D&Cs in the past. She had a vaginal hysterectomy 1972. Anal repair in 1972. She's had cataract surgery. She's had a colonoscopy in 2008. She had a liver biopsy, which showed the nonalcoholic steatohepatitis.  Social History:  reports that she has been smoking Cigarettes.  She has been smoking about .25 packs per day. She does not have any smokeless tobacco history on file. She reports that she does not drink alcohol or use illicit drugs.  Family History:  Family History  Problem Relation Age of Onset  . Diabetes  Mother     Review of Systems  Constitutional: Negative.   HENT: Negative.   Eyes: Negative.   Respiratory: Negative for cough, shortness of breath and wheezing.   Cardiovascular: Positive for chest pain. Negative for palpitations and leg swelling.  Gastrointestinal: Positive for nausea and vomiting. Negative for heartburn.  Genitourinary: Negative.   Musculoskeletal: Negative.   Skin: Negative.   Neurological: Negative.   Endo/Heme/Allergies: Negative.   Psychiatric/Behavioral: Negative.      Blood pressure 120/47, pulse 75, temperature 98.3 F (36.8 C), temperature source Oral, resp. rate 20, height 5' 3"  (1.6 m), weight 94.348 kg (208 lb), SpO2 96.00%. Physical Exam  Vitals reviewed. Constitutional: She is oriented to person, place, and time. She appears well-developed and well-nourished. No distress.  HENT:  Head: Normocephalic and atraumatic.  Mouth/Throat: Oropharynx is clear and moist. No oropharyngeal exudate.  Eyes: EOM are normal. Pupils are equal, round, and reactive to light. Right eye exhibits no discharge. Left eye exhibits no discharge. No scleral icterus.  Neck: Normal range of motion. Neck supple. No JVD present. No tracheal deviation present. No thyromegaly present.  Cardiovascular: Normal rate and regular rhythm.  Exam reveals no gallop and no friction rub.   Murmur heard.  Systolic murmur is present  Pulmonary/Chest: Effort normal and breath sounds normal. No stridor. No respiratory distress. She has no wheezes. She has no rales.  Abdominal: Soft. Bowel sounds are normal. She exhibits no distension and no mass. There is no tenderness. There is no rebound and no guarding.  Musculoskeletal: Normal range of motion. She exhibits no edema.  Neurological: She is alert and oriented to person, place, and time. No cranial nerve deficit.  Skin: Skin is warm and dry. She is not diaphoretic. No erythema.  Psychiatric: She has a normal mood and affect.     Results for  orders placed during the hospital encounter of 07/03/11 (from the past 48 hour(s))  CBC     Status: Normal   Collection Time   07/03/11  5:55 PM      Component Value Range Comment   WBC 7.9  4.0 - 10.5 (K/uL)    RBC 4.05  3.87 - 5.11 (MIL/uL)    Hemoglobin 12.1  12.0 - 15.0 (g/dL)    HCT 36.5  36.0 - 46.0 (%)    MCV 90.1  78.0 - 100.0 (fL)    MCH 29.9  26.0 - 34.0 (pg)    MCHC 33.2  30.0 - 36.0 (g/dL)    RDW 14.7  11.5 - 15.5 (%)    Platelets 164  150 - 400 (K/uL)   BASIC METABOLIC PANEL     Status: Abnormal   Collection Time   07/03/11  5:55 PM      Component Value Range  Comment   Sodium 139  135 - 145 (mEq/L)    Potassium 3.6  3.5 - 5.1 (mEq/L)    Chloride 104  96 - 112 (mEq/L)    CO2 27  19 - 32 (mEq/L)    Glucose, Bld 196 (*) 70 - 99 (mg/dL)    BUN 17  6 - 23 (mg/dL)    Creatinine, Ser 0.71  0.50 - 1.10 (mg/dL)    Calcium 9.8  8.4 - 10.5 (mg/dL)    GFR calc non Af Amer 87 (*) >90 (mL/min)    GFR calc Af Amer >90  >90 (mL/min)   CARDIAC PANEL(CRET KIN+CKTOT+MB+TROPI)     Status: Abnormal   Collection Time   07/03/11  5:55 PM      Component Value Range Comment   Total CK 106  7 - 177 (U/L)    CK, MB 3.8  0.3 - 4.0 (ng/mL)    Troponin I <0.30  <0.30 (ng/mL)    Relative Index 3.6 (*) 0.0 - 2.5     Dg Chest 2 View  07/03/2011  *RADIOLOGY REPORT*  Clinical Data: Chest pain, history of smoking  CHEST - 2 VIEW  Comparison: 01/25/2008  Findings: Normal cardiac size.  Clear lung fields.  Mild degenerative change thoracic spine.  Mild hyperinflation.  No effusion or pneumothorax.  IMPRESSION: No active disease. Little change from priors.  Original Report Authenticated By: Staci Righter, M.D.   EKG was done, which shows sinus rhythm at 85 beats per minute with normal axis. Intervals appear to be in the normal range. No Q waves. No concerning ST or T-wave changes.  Assessment/Plan  Principal Problem:  *Chest pain at rest Active Problems:  CAD (coronary artery disease)   Diabetes mellitus  Hypercholesteremia  GERD (gastroesophageal reflux disease)   #1 chest pain: With a known history of CAD this patient will be observed overnight to rule her out for acute coronary syndrome. Lipid panel will be checked in the morning. Aspirin will be given. We will consult [facility] in the morning. If she rules out and, if she remains chest pain-free rest of the workup can be pursued as an outpatient. EKG will be repeated in the morning.  #2 diabetes, on insulin: Sliding scale. Will be provided. She'll be continued on her home dosage of insulin. HbA1c will be checked.  #3 known history of coronary artery disease: Continue with aspirin beta blockers. Her ejection fraction is unknown.  #4 history of hypertension: Blood pressure stable at this time.   #5 history of hypercholesterolemia: Continue with Crestor.  #6 history of GERD continue with PPI.  #7 tobacco abuse: Smoking cessation will be emphasized.  She's a full code. DVT, prophylaxis will be provided.  Further management decisions will depend on results of further testing and patient's response to treatment.  Kearston Putman 07/03/2011, 8:37 PM

## 2011-07-03 NOTE — ED Notes (Signed)
Pt c/o mid chest pain since 11am that radiates to her left shoulder.

## 2011-07-04 ENCOUNTER — Inpatient Hospital Stay (HOSPITAL_COMMUNITY): Payer: Medicare Other

## 2011-07-04 ENCOUNTER — Encounter (HOSPITAL_COMMUNITY): Payer: Self-pay | Admitting: Internal Medicine

## 2011-07-04 DIAGNOSIS — IMO0001 Reserved for inherently not codable concepts without codable children: Secondary | ICD-10-CM | POA: Insufficient documentation

## 2011-07-04 DIAGNOSIS — I351 Nonrheumatic aortic (valve) insufficiency: Secondary | ICD-10-CM

## 2011-07-04 DIAGNOSIS — Z955 Presence of coronary angioplasty implant and graft: Secondary | ICD-10-CM | POA: Insufficient documentation

## 2011-07-04 DIAGNOSIS — I6523 Occlusion and stenosis of bilateral carotid arteries: Secondary | ICD-10-CM

## 2011-07-04 DIAGNOSIS — G4733 Obstructive sleep apnea (adult) (pediatric): Secondary | ICD-10-CM

## 2011-07-04 DIAGNOSIS — I10 Essential (primary) hypertension: Secondary | ICD-10-CM

## 2011-07-04 HISTORY — DX: Obstructive sleep apnea (adult) (pediatric): G47.33

## 2011-07-04 HISTORY — DX: Nonrheumatic aortic (valve) insufficiency: I35.1

## 2011-07-04 HISTORY — DX: Occlusion and stenosis of bilateral carotid arteries: I65.23

## 2011-07-04 LAB — CBC
MCV: 89.7 fL (ref 78.0–100.0)
Platelets: 147 10*3/uL — ABNORMAL LOW (ref 150–400)
RBC: 3.87 MIL/uL (ref 3.87–5.11)
RDW: 14.8 % (ref 11.5–15.5)
WBC: 7.2 10*3/uL (ref 4.0–10.5)

## 2011-07-04 LAB — COMPREHENSIVE METABOLIC PANEL
Albumin: 3.4 g/dL — ABNORMAL LOW (ref 3.5–5.2)
Alkaline Phosphatase: 92 U/L (ref 39–117)
BUN: 16 mg/dL (ref 6–23)
CO2: 28 mEq/L (ref 19–32)
Chloride: 104 mEq/L (ref 96–112)
Creatinine, Ser: 0.67 mg/dL (ref 0.50–1.10)
GFR calc non Af Amer: 88 mL/min — ABNORMAL LOW (ref >90)
Glucose, Bld: 151 mg/dL — ABNORMAL HIGH (ref 70–99)
Potassium: 3.4 mEq/L — ABNORMAL LOW (ref 3.5–5.1)
Total Bilirubin: 0.3 mg/dL (ref 0.3–1.2)

## 2011-07-04 LAB — CARDIAC PANEL(CRET KIN+CKTOT+MB+TROPI)
CK, MB: 3.5 ng/mL (ref 0.3–4.0)
CK, MB: 3.6 ng/mL (ref 0.3–4.0)
Relative Index: 3.3 — ABNORMAL HIGH (ref 0.0–2.5)
Total CK: 105 U/L (ref 7–177)
Total CK: 107 U/L (ref 7–177)
Troponin I: <0.3 ng/mL (ref <0.30)

## 2011-07-04 LAB — D-DIMER, QUANTITATIVE: D-Dimer, Quant: 0.71 ug/mL-FEU — ABNORMAL HIGH (ref 0.00–0.48)

## 2011-07-04 LAB — HEMOGLOBIN A1C: Hgb A1c MFr Bld: 6.8 % — ABNORMAL HIGH (ref <5.7)

## 2011-07-04 LAB — GLUCOSE, CAPILLARY: Glucose-Capillary: 140 mg/dL — ABNORMAL HIGH (ref 70–99)

## 2011-07-04 MED ORDER — PANTOPRAZOLE SODIUM 40 MG PO TBEC
40.0000 mg | DELAYED_RELEASE_TABLET | Freq: Every day | ORAL | Status: DC
  Administered 2011-07-04: 40 mg via ORAL
  Filled 2011-07-04: qty 1

## 2011-07-04 MED ORDER — ISOSORBIDE MONONITRATE ER 60 MG PO TB24
30.0000 mg | ORAL_TABLET | Freq: Every day | ORAL | Status: DC
  Administered 2011-07-04: 30 mg via ORAL
  Filled 2011-07-04: qty 1

## 2011-07-04 MED ORDER — ROSUVASTATIN CALCIUM 5 MG PO TABS: 10.0000 mg | ORAL_TABLET | Freq: Every day | ORAL | Status: DC

## 2011-07-04 MED ORDER — LOSARTAN POTASSIUM-HCTZ 50-12.5 MG PO TABS: 1.0000 | ORAL_TABLET | Freq: Every day | ORAL | Status: DC

## 2011-07-04 MED ORDER — INSULIN ASPART PROT & ASPART (70-30 MIX) 100 UNIT/ML ~~LOC~~ SUSP: 60.0000 [IU] | Freq: Two times a day (BID) | SUBCUTANEOUS | Status: DC

## 2011-07-04 MED ORDER — INSULIN ASPART PROT & ASPART (70-30 MIX) 100 UNIT/ML ~~LOC~~ SUSP
60.0000 [IU] | Freq: Two times a day (BID) | SUBCUTANEOUS | Status: DC
  Filled 2011-07-04: qty 3

## 2011-07-04 MED ORDER — HYDROCHLOROTHIAZIDE 25 MG PO TABS
12.5000 mg | ORAL_TABLET | Freq: Every day | ORAL | Status: DC
  Administered 2011-07-04: 12.5 mg via ORAL
  Filled 2011-07-04: qty 1

## 2011-07-04 MED ORDER — LOSARTAN POTASSIUM 50 MG PO TABS
50.0000 mg | ORAL_TABLET | Freq: Every day | ORAL | Status: DC
  Administered 2011-07-04: 50 mg via ORAL
  Filled 2011-07-04: qty 1

## 2011-07-04 MED ORDER — NITROFURANTOIN MACROCRYSTAL 50 MG PO CAPS
50.0000 mg | ORAL_CAPSULE | Freq: Every day | ORAL | Status: DC
  Administered 2011-07-04: 50 mg via ORAL
  Filled 2011-07-04 (×4): qty 1

## 2011-07-04 MED ORDER — POTASSIUM CHLORIDE CRYS ER 20 MEQ PO TBCR
40.0000 meq | EXTENDED_RELEASE_TABLET | Freq: Once | ORAL | Status: AC
  Administered 2011-07-04: 40 meq via ORAL
  Filled 2011-07-04: qty 2

## 2011-07-04 MED ORDER — INSULIN GLARGINE 100 UNIT/ML ~~LOC~~ SOLN: 35.0000 [IU] | Freq: Every day | SUBCUTANEOUS | Status: DC

## 2011-07-04 MED ORDER — ASPIRIN EC 81 MG PO TBEC
81.0000 mg | DELAYED_RELEASE_TABLET | Freq: Every day | ORAL | Status: DC
  Administered 2011-07-04: 81 mg via ORAL
  Filled 2011-07-04: qty 1

## 2011-07-04 MED ORDER — TEMAZEPAM 15 MG PO CAPS: 15.0000 mg | ORAL_CAPSULE | Freq: Every evening | ORAL | Status: DC | PRN

## 2011-07-04 MED ORDER — INSULIN NPH ISOPHANE & REGULAR (70-30) 100 UNIT/ML ~~LOC~~ SUSP
60.0000 [IU] | Freq: Two times a day (BID) | SUBCUTANEOUS | Status: DC
  Administered 2011-07-04: 60 [IU] via SUBCUTANEOUS
  Filled 2011-07-04: qty 10

## 2011-07-04 MED ORDER — IOHEXOL 350 MG/ML SOLN
100.0000 mL | Freq: Once | INTRAVENOUS | Status: AC | PRN
  Administered 2011-07-04: 100 mL via INTRAVENOUS

## 2011-07-04 MED ORDER — CARVEDILOL 3.125 MG PO TABS
6.2500 mg | ORAL_TABLET | Freq: Two times a day (BID) | ORAL | Status: DC
  Administered 2011-07-04: 6.25 mg via ORAL
  Filled 2011-07-04: qty 2

## 2011-07-04 NOTE — Progress Notes (Signed)
WRITER DISCUSSED AND REVIEWED DISCHARGE INSTRUCTIONS AND MEDICATIONS, VERBALIZED UNDERSTANDING.  ENCOURAGED TO CALL WITH QUESTIONS THAT MAY ARISE.  PT WHEEL CHAIRED OUT IN STABLE CONDITION VIA STAFF MEMBER AND TOOK ALL BELONGINGS.  PT IN NO DISTRESS AT TIME OF DISCHARGE.

## 2011-07-04 NOTE — Progress Notes (Signed)
Subjective: [name] feels well today. Her chest pain has resolved.  Objective: Weight change:   Intake/Output Summary (Last 24 hours) at 07/04/11 1046 Last data filed at 07/04/11 1002  Gross per 24 hour  Intake    183 ml  Output      0 ml  Net    183 ml   General: Patient appears her stated age. She does not seem to be in any acute distress. HEENT: Head normocephalic atraumatic. Lungs: Clear to auscultation bilaterally no wheezes rhonchus or rales. Cardiovascular: Heart regular rate and rhythm no murmurs rubs or gallops. Extremities: No edema.   Lab Results: Labs reviewed  Micro Results: No results found for this or any previous visit (from the past 240 hour(s)).  Studies/Results: Dg Chest 2 View  07/03/2011  *RADIOLOGY REPORT*  Clinical Data: Chest pain, history of smoking  CHEST - 2 VIEW  Comparison: 01/25/2008  Findings: Normal cardiac size.  Clear lung fields.  Mild degenerative change thoracic spine.  Mild hyperinflation.  No effusion or pneumothorax.  IMPRESSION: No active disease. Little change from priors.  Original Report Authenticated By: Staci Righter, M.D.   Medications: Scheduled Meds:   . docusate sodium  100 mg Oral BID  . enoxaparin  40 mg Subcutaneous Q24H  . insulin aspart  0-15 Units Subcutaneous TID WC  . insulin aspart  0-5 Units Subcutaneous QHS  . nitroGLYCERIN  0.4 mg Sublingual Once  . sodium chloride  3 mL Intravenous Q12H   Continuous Infusions:  PRN Meds:.acetaminophen, acetaminophen, morphine, ondansetron (ZOFRAN) IV, ondansetron, oxyCODONE, senna, zolpidem, DISCONTD: albuterol  Assessment/Plan: Chest pain at rest (07/03/2011) Ppatient is chest pain-free. She does have coronary artery disease with stent in the past. Will ask her cardiologist to see her prior to discharge. Will also order a d-dimer. Will resume her home medications.  CAD (coronary artery disease) (07/03/2011) As mentioned before patient normally takes aspirin and Coreg,  as well as imdur at home. Will resume those medications.  Diabetes mellitus (07/03/2011)  Patient normally takes Lantus at home. Will resume her Lantus.  Hypercholesteremia (07/03/2011)  Continue her Crestor.  GERD (gastroesophageal reflux disease) (07/03/2011) Continue her Prilosec.  Disposition: Disposition will be based on cardiology recommendations.   LOS: 1 day   Rosana Hoes MD, Sharlet Salina 07/04/2011, 10:46 AM

## 2011-07-04 NOTE — Progress Notes (Signed)
Inpatient Diabetes Program Recommendations  AACE/ADA: New Consensus Statement on Inpatient Glycemic Control (2009)  Target Ranges:  Prepandial:   less than 140 mg/dL      Peak postprandial:   less than 180 mg/dL (1-2 hours)      Critically ill patients:  140 - 180 mg/dL   Reason for Visit: History of DM and insulin-requiring.    Inpatient Diabetes Program Recommendations Insulin - Basal: Patient on home basal insulin:  Most likely will require while in hospital

## 2011-07-04 NOTE — Consult Note (Signed)
Reason for Consult: Chest pain  Requesting Physician: Dr. Sloan Leiter    HPI: This is a 70 y.o. female with a past medical history significant for CAD with MI in 1995, s/p PCI x 2 stents to the RCA at that time, diastolic heart failure, aortic sclerosis, mild aortic insufficiency, morbid obesity and obstructive sleep apnea. I had last seen her in the office in May of 2012 and she complained of vertigo. She now presents to the emergency room with nausea, vomiting, and chest pain. The symptoms have reportedly now resolved. Cardiac enzymes are negative x2. There are no acute EKG changes.  PMHx:  Past Medical History  Diagnosis Date  . Back pain   . GERD (gastroesophageal reflux disease)   . MI (myocardial infarction)   . Hypercholesteremia   . Diabetes mellitus   . Anemia    Aortic sclerosis     Aortic insufficiency     Obstructive sleep apnea     Mild bilateral carotid disease (<50%)   . Cirrhosis    Past Surgical History  Procedure Date  . Eye surgery   . Back surgery   . Appendectomy   . Cardiac catheterization     FAMHx: Family History  Problem Relation Age of Onset  . Diabetes Mother     SOCHx:  reports that she has been smoking Cigarettes.  She has been smoking about .25 packs per day. She does not have any smokeless tobacco history on file. She reports that she does not drink alcohol or use illicit drugs.  ALLERGIES: Allergies  Allergen Reactions  . Tape Rash    ADHESIVE TAPE    ROS: Constitutional: negative Respiratory: negative Cardiovascular: positive for chest pain Gastrointestinal: positive for nausea and vomiting Musculoskeletal:positive for myalgias  HOME MEDICATIONS: Prescriptions prior to admission  Medication Sig Dispense Refill  . aspirin EC 81 MG tablet Take 81 mg by mouth daily.        . carvedilol (COREG) 6.25 MG tablet Take 6.25 mg by mouth 2 (two) times daily with a meal.        . diphenhydramine-acetaminophen (TYLENOL PM) 25-500 MG TABS  Take 1 tablet by mouth at bedtime as needed. For sleep       . insulin glargine (LANTUS SOLOSTAR) 100 UNIT/ML injection Inject 35 Units into the skin at bedtime.       . insulin NPH-insulin regular (NOVOLIN 70/30) (70-30) 100 UNIT/ML injection Inject 60 Units into the skin 2 (two) times daily with a meal.       . isosorbide mononitrate (IMDUR) 30 MG 24 hr tablet Take 30 mg by mouth daily.        Marland Kitchen losartan-hydrochlorothiazide (HYZAAR) 50-12.5 MG per tablet Take 1 tablet by mouth daily.        . naproxen sodium (ANAPROX) 220 MG tablet Take 220-440 mg by mouth 2 (two) times daily as needed. For hand pain       . nitrofurantoin (MACRODANTIN) 50 MG capsule Take 50 mg by mouth daily.        Marland Kitchen omeprazole (PRILOSEC) 20 MG capsule Take 20 mg by mouth daily.        . rosuvastatin (CRESTOR) 10 MG tablet Take 10 mg by mouth at bedtime.        . temazepam (RESTORIL) 15 MG capsule Take 15 mg by mouth at bedtime.          HOSPITAL MEDICATIONS: Prior to Admission:  Prescriptions prior to admission  Medication Sig Dispense Refill  . aspirin  EC 81 MG tablet Take 81 mg by mouth daily.        . carvedilol (COREG) 6.25 MG tablet Take 6.25 mg by mouth 2 (two) times daily with a meal.        . diphenhydramine-acetaminophen (TYLENOL PM) 25-500 MG TABS Take 1 tablet by mouth at bedtime as needed. For sleep       . insulin glargine (LANTUS SOLOSTAR) 100 UNIT/ML injection Inject 35 Units into the skin at bedtime.       . insulin NPH-insulin regular (NOVOLIN 70/30) (70-30) 100 UNIT/ML injection Inject 60 Units into the skin 2 (two) times daily with a meal.       . isosorbide mononitrate (IMDUR) 30 MG 24 hr tablet Take 30 mg by mouth daily.        Marland Kitchen losartan-hydrochlorothiazide (HYZAAR) 50-12.5 MG per tablet Take 1 tablet by mouth daily.        . naproxen sodium (ANAPROX) 220 MG tablet Take 220-440 mg by mouth 2 (two) times daily as needed. For hand pain       . nitrofurantoin (MACRODANTIN) 50 MG capsule Take 50 mg by  mouth daily.        Marland Kitchen omeprazole (PRILOSEC) 20 MG capsule Take 20 mg by mouth daily.        . rosuvastatin (CRESTOR) 10 MG tablet Take 10 mg by mouth at bedtime.        . temazepam (RESTORIL) 15 MG capsule Take 15 mg by mouth at bedtime.          VITALS: Blood pressure 150/73, pulse 86, temperature 97.2 F (36.2 C), temperature source Oral, resp. rate 19, height 5' 3"  (1.6 m), weight 95.437 kg (210 lb 6.4 oz), SpO2 95.00%.  PHYSICAL EXAM: General appearance: alert and appears stated age Neck: no adenopathy, no carotid bruit, no JVD, supple, symmetrical, trachea midline and thyroid not enlarged, symmetric, no tenderness/mass/nodules Lungs: clear to auscultation bilaterally Heart: S1, S2 normal, systolic and diastolic murmurs; early systolic 2/6, low pitch at apex; 2/6 diastolic at RUSB Abdomen: soft, non-tender; bowel sounds normal; no masses,  no organomegaly Extremities: extremities normal, atraumatic, no cyanosis or edema Pulses: 2+ and symmetric Skin: Skin color, texture, turgor normal. No rashes or lesions Neurologic: Grossly normal  LABS: Results for orders placed during the hospital encounter of 07/03/11 (from the past 48 hour(s))  CBC     Status: Normal   Collection Time   07/03/11  5:55 PM      Component Value Range Comment   WBC 7.9  4.0 - 10.5 (K/uL)    RBC 4.05  3.87 - 5.11 (MIL/uL)    Hemoglobin 12.1  12.0 - 15.0 (g/dL)    HCT 36.5  36.0 - 46.0 (%)    MCV 90.1  78.0 - 100.0 (fL)    MCH 29.9  26.0 - 34.0 (pg)    MCHC 33.2  30.0 - 36.0 (g/dL)    RDW 14.7  11.5 - 15.5 (%)    Platelets 164  150 - 400 (K/uL)   BASIC METABOLIC PANEL     Status: Abnormal   Collection Time   07/03/11  5:55 PM      Component Value Range Comment   Sodium 139  135 - 145 (mEq/L)    Potassium 3.6  3.5 - 5.1 (mEq/L)    Chloride 104  96 - 112 (mEq/L)    CO2 27  19 - 32 (mEq/L)    Glucose, Bld 196 (*) 70 - 99 (mg/dL)  BUN 17  6 - 23 (mg/dL)    Creatinine, Ser 0.71  0.50 - 1.10 (mg/dL)     Calcium 9.8  8.4 - 10.5 (mg/dL)    GFR calc non Af Amer 87 (*) >90 (mL/min)    GFR calc Af Amer >90  >90 (mL/min)   CARDIAC PANEL(CRET KIN+CKTOT+MB+TROPI)     Status: Abnormal   Collection Time   07/03/11  5:55 PM      Component Value Range Comment   Total CK 106  7 - 177 (U/L)    CK, MB 3.8  0.3 - 4.0 (ng/mL)    Troponin I <0.30  <0.30 (ng/mL)    Relative Index 3.6 (*) 0.0 - 2.5    GLUCOSE, CAPILLARY     Status: Abnormal   Collection Time   07/03/11 10:09 PM      Component Value Range Comment   Glucose-Capillary 136 (*) 70 - 99 (mg/dL)    Comment 1 Notify RN     CARDIAC PANEL(CRET KIN+CKTOT+MB+TROPI)     Status: Abnormal   Collection Time   07/03/11 11:41 PM      Component Value Range Comment   Total CK 105  7 - 177 (U/L)    CK, MB 3.5  0.3 - 4.0 (ng/mL)    Troponin I <0.30  <0.30 (ng/mL)    Relative Index 3.3 (*) 0.0 - 2.5    GLUCOSE, CAPILLARY     Status: Abnormal   Collection Time   07/04/11  7:34 AM      Component Value Range Comment   Glucose-Capillary 140 (*) 70 - 99 (mg/dL)   CARDIAC PANEL(CRET KIN+CKTOT+MB+TROPI)     Status: Abnormal   Collection Time   07/04/11  8:00 AM      Component Value Range Comment   Total CK 107  7 - 177 (U/L)    CK, MB 3.6  0.3 - 4.0 (ng/mL)    Troponin I <0.30  <0.30 (ng/mL)    Relative Index 3.4 (*) 0.0 - 2.5    COMPREHENSIVE METABOLIC PANEL     Status: Abnormal   Collection Time   07/04/11  8:00 AM      Component Value Range Comment   Sodium 141  135 - 145 (mEq/L)    Potassium 3.4 (*) 3.5 - 5.1 (mEq/L)    Chloride 104  96 - 112 (mEq/L)    CO2 28  19 - 32 (mEq/L)    Glucose, Bld 151 (*) 70 - 99 (mg/dL)    BUN 16  6 - 23 (mg/dL)    Creatinine, Ser 0.67  0.50 - 1.10 (mg/dL)    Calcium 9.2  8.4 - 10.5 (mg/dL)    Total Protein 6.4  6.0 - 8.3 (g/dL)    Albumin 3.4 (*) 3.5 - 5.2 (g/dL)    AST 24  0 - 37 (U/L)    ALT 19  0 - 35 (U/L)    Alkaline Phosphatase 92  39 - 117 (U/L)    Total Bilirubin 0.3  0.3 - 1.2 (mg/dL)    GFR calc  non Af Amer 88 (*) >90 (mL/min)    GFR calc Af Amer >90  >90 (mL/min)   CBC     Status: Abnormal   Collection Time   07/04/11  8:00 AM      Component Value Range Comment   WBC 7.2  4.0 - 10.5 (K/uL)    RBC 3.87  3.87 - 5.11 (MIL/uL)    Hemoglobin 11.6 (*) 12.0 -  15.0 (g/dL)    HCT 34.7 (*) 36.0 - 46.0 (%)    MCV 89.7  78.0 - 100.0 (fL)    MCH 30.0  26.0 - 34.0 (pg)    MCHC 33.4  30.0 - 36.0 (g/dL)    RDW 14.8  11.5 - 15.5 (%)    Platelets 147 (*) 150 - 400 (K/uL)     IMAGING: Dg Chest 2 View  07/03/2011  *RADIOLOGY REPORT*  Clinical Data: Chest pain, history of smoking  CHEST - 2 VIEW  Comparison: 01/25/2008  Findings: Normal cardiac size.  Clear lung fields.  Mild degenerative change thoracic spine.  Mild hyperinflation.  No effusion or pneumothorax.  IMPRESSION: No active disease. Little change from priors.  Original Report Authenticated By: Staci Righter, M.D.   EKG: Sinus rhythm without ischemic changes.  IMPRESSION: 1. Gastritis/reflux  RECOMMENDATION: 1. Ms. Sudbeck pain is atypical for a cardiac chest pain.  She is ruled out for acute MI. Given her cardiac history would recommend outpatient stress testing. I will ask her office contact her to arrange a stress test and followup appointment with me in a few weeks. Thanks for the consult.  Time Spent Directly with Patient: 30 minutes  [email or web address] 07/04/2011, 11:55 AM

## 2011-07-04 NOTE — Discharge Summary (Signed)
DISCHARGE SUMMARY  [name]  MR#: [id number]  DOB:June 17, 1941  Date of Admission: 07/03/2011 Date of Discharge: 07/04/2011  Attending Physician:Davis MD, Sharlet Salina  Patient's XEN:[id number] M, MD  Consults:Treatment Team:  [facility] [place]  Discharge Diagnoses: .Chest pain at rest .CAD (coronary artery disease) .Diabetes mellitus Elevated d-dimer. Cirrhosis.     Current Discharge Medication List    CONTINUE these medications which have NOT CHANGED   Details  aspirin EC 81 MG tablet Take 81 mg by mouth daily.      carvedilol (COREG) 6.25 MG tablet Take 6.25 mg by mouth 2 (two) times daily with a meal.      diphenhydramine-acetaminophen (TYLENOL PM) 25-500 MG TABS Take 1 tablet by mouth at bedtime as needed. For sleep     insulin glargine (LANTUS SOLOSTAR) 100 UNIT/ML injection Inject 35 Units into the skin at bedtime.     insulin NPH-insulin regular (NOVOLIN 70/30) (70-30) 100 UNIT/ML injection Inject 60 Units into the skin 2 (two) times daily with a meal.     isosorbide mononitrate (IMDUR) 30 MG 24 hr tablet Take 30 mg by mouth daily.      losartan-hydrochlorothiazide (HYZAAR) 50-12.5 MG per tablet Take 1 tablet by mouth daily.      nitrofurantoin (MACRODANTIN) 50 MG capsule Take 50 mg by mouth daily.      omeprazole (PRILOSEC) 20 MG capsule Take 20 mg by mouth daily.      rosuvastatin (CRESTOR) 10 MG tablet Take 10 mg by mouth at bedtime.      temazepam (RESTORIL) 15 MG capsule Take 15 mg by mouth at bedtime.        STOP taking these medications     naproxen sodium (ANAPROX) 220 MG tablet           Hospital Course: .Chest pain at rest Patient was admitted to the hospital with chest pain rule out. Patient  ruled out by enzymes. She was evaluated in-house by [facility] cardiologist Dr. Debara Pickett. He states that patient is okay for discharge. His office will call patient and schedule outpatient stress test.    .CAD  (coronary artery disease) As mentioned above patient will follow up outpatient cardiology for stress test.   .Diabetes mellitus: Patient to continue her home medication regimen for her diabetes.  Elevated d-dimer: Patient's d-dimer was elevated.  She had a CT angio of the chest that was negative for PE. It did show her liver with cirrhosis. Patient states that she does have a history of cirrhosis.  Cirrhosis: Stable   Day of Discharge BP 129/62  Pulse 71  Temp(Src) 97.2 F (36.2 C) (Oral)  Resp 18  Ht 5' 3"  (1.6 m)  Wt 95.437 kg (210 lb 6.4 oz)  BMI 37.27 kg/m2  SpO2 96%  Physical Exam: General: Patient appears her stated age. She does not seem to be in any acute distress.  HEENT: Head normocephalic atraumatic.  Lungs: Clear to auscultation bilaterally no wheezes rhonchus or rales.  Cardiovascular: Heart regular rate and rhythm no murmurs rubs or gallops.  Extremities: No edema.   Results for orders placed during the hospital encounter of 07/03/11 (from the past 24 hour(s))  CBC     Status: Normal   Collection Time   07/03/11  5:55 PM      Component Value Range   WBC 7.9  4.0 - 10.5 (K/uL)   RBC 4.05  3.87 - 5.11 (MIL/uL)   Hemoglobin 12.1  12.0 - 15.0 (g/dL)  HCT 36.5  36.0 - 46.0 (%)   MCV 90.1  78.0 - 100.0 (fL)   MCH 29.9  26.0 - 34.0 (pg)   MCHC 33.2  30.0 - 36.0 (g/dL)   RDW 14.7  11.5 - 15.5 (%)   Platelets 164  150 - 400 (K/uL)  BASIC METABOLIC PANEL     Status: Abnormal   Collection Time   07/03/11  5:55 PM      Component Value Range   Sodium 139  135 - 145 (mEq/L)   Potassium 3.6  3.5 - 5.1 (mEq/L)   Chloride 104  96 - 112 (mEq/L)   CO2 27  19 - 32 (mEq/L)   Glucose, Bld 196 (*) 70 - 99 (mg/dL)   BUN 17  6 - 23 (mg/dL)   Creatinine, Ser 0.71  0.50 - 1.10 (mg/dL)   Calcium 9.8  8.4 - 10.5 (mg/dL)   GFR calc non Af Amer 87 (*) >90 (mL/min)   GFR calc Af Amer >90  >90 (mL/min)  CARDIAC PANEL(CRET KIN+CKTOT+MB+TROPI)     Status: Abnormal    Collection Time   07/03/11  5:55 PM      Component Value Range   Total CK 106  7 - 177 (U/L)   CK, MB 3.8  0.3 - 4.0 (ng/mL)   Troponin I <0.30  <0.30 (ng/mL)   Relative Index 3.6 (*) 0.0 - 2.5   GLUCOSE, CAPILLARY     Status: Abnormal   Collection Time   07/03/11 10:09 PM      Component Value Range   Glucose-Capillary 136 (*) 70 - 99 (mg/dL)   Comment 1 Notify RN    CARDIAC PANEL(CRET KIN+CKTOT+MB+TROPI)     Status: Abnormal   Collection Time   07/03/11 11:41 PM      Component Value Range   Total CK 105  7 - 177 (U/L)   CK, MB 3.5  0.3 - 4.0 (ng/mL)   Troponin I <0.30  <0.30 (ng/mL)   Relative Index 3.3 (*) 0.0 - 2.5   GLUCOSE, CAPILLARY     Status: Abnormal   Collection Time   07/04/11  7:34 AM      Component Value Range   Glucose-Capillary 140 (*) 70 - 99 (mg/dL)  CARDIAC PANEL(CRET KIN+CKTOT+MB+TROPI)     Status: Abnormal   Collection Time   07/04/11  8:00 AM      Component Value Range   Total CK 107  7 - 177 (U/L)   CK, MB 3.6  0.3 - 4.0 (ng/mL)   Troponin I <0.30  <0.30 (ng/mL)   Relative Index 3.4 (*) 0.0 - 2.5   COMPREHENSIVE METABOLIC PANEL     Status: Abnormal   Collection Time   07/04/11  8:00 AM      Component Value Range   Sodium 141  135 - 145 (mEq/L)   Potassium 3.4 (*) 3.5 - 5.1 (mEq/L)   Chloride 104  96 - 112 (mEq/L)   CO2 28  19 - 32 (mEq/L)   Glucose, Bld 151 (*) 70 - 99 (mg/dL)   BUN 16  6 - 23 (mg/dL)   Creatinine, Ser 0.67  0.50 - 1.10 (mg/dL)   Calcium 9.2  8.4 - 10.5 (mg/dL)   Total Protein 6.4  6.0 - 8.3 (g/dL)   Albumin 3.4 (*) 3.5 - 5.2 (g/dL)   AST 24  0 - 37 (U/L)   ALT 19  0 - 35 (U/L)   Alkaline Phosphatase 92  39 - 117 (  U/L)   Total Bilirubin 0.3  0.3 - 1.2 (mg/dL)   GFR calc non Af Amer 88 (*) >90 (mL/min)   GFR calc Af Amer >90  >90 (mL/min)  CBC     Status: Abnormal   Collection Time   07/04/11  8:00 AM      Component Value Range   WBC 7.2  4.0 - 10.5 (K/uL)   RBC 3.87  3.87 - 5.11 (MIL/uL)   Hemoglobin 11.6 (*) 12.0 -  15.0 (g/dL)   HCT 34.7 (*) 36.0 - 46.0 (%)   MCV 89.7  78.0 - 100.0 (fL)   MCH 30.0  26.0 - 34.0 (pg)   MCHC 33.4  30.0 - 36.0 (g/dL)   RDW 14.8  11.5 - 15.5 (%)   Platelets 147 (*) 150 - 400 (K/uL)  D-DIMER, QUANTITATIVE     Status: Abnormal   Collection Time   07/04/11 11:27 AM      Component Value Range   D-Dimer, Quant 0.71 (*) 0.00 - 0.48 (ug/mL-FEU)  GLUCOSE, CAPILLARY     Status: Abnormal   Collection Time   07/04/11 11:43 AM      Component Value Range   Glucose-Capillary 207 (*) 70 - 99 (mg/dL)    Disposition: Home   Follow-up Appts: Discharge Orders    Future Orders Please Complete By Expires   Diet - low sodium heart healthy      Increase activity slowly         Follow-up with  Dr. Debara Pickett in a few weeks.  The office will call you with followup.   Tests Needing Follow-up: Outpatient stress test.  Signed: Rosana Hoes MD, Sharlet Salina 07/04/2011, 3:58 PM

## 2012-01-24 IMAGING — CR DG CHEST 2V
2 series · 2 of 2 positions shown · non-contrast
Comparison: 01/25/2008

CLINICAL DATA: Chest pain, history of smoking

CHEST - 2 VIEW

[view not recorded (1 of 2)]
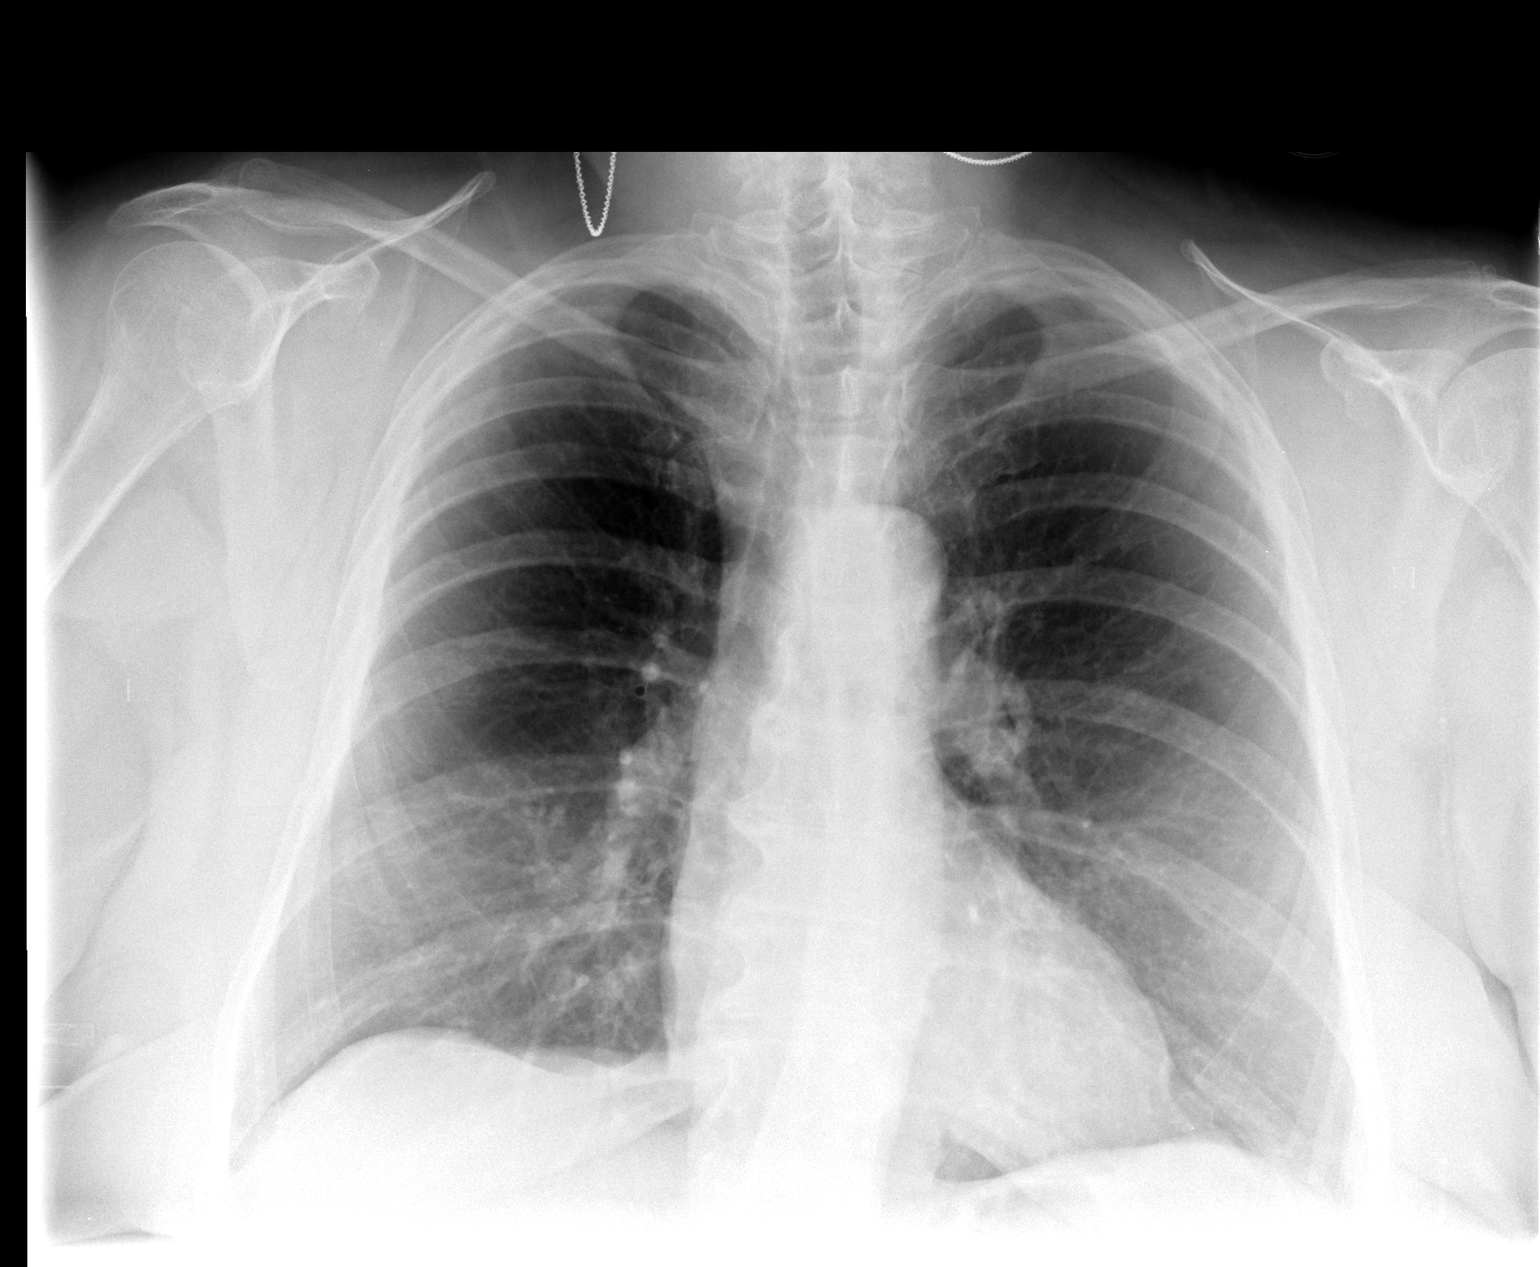

[view not recorded (2 of 2)]
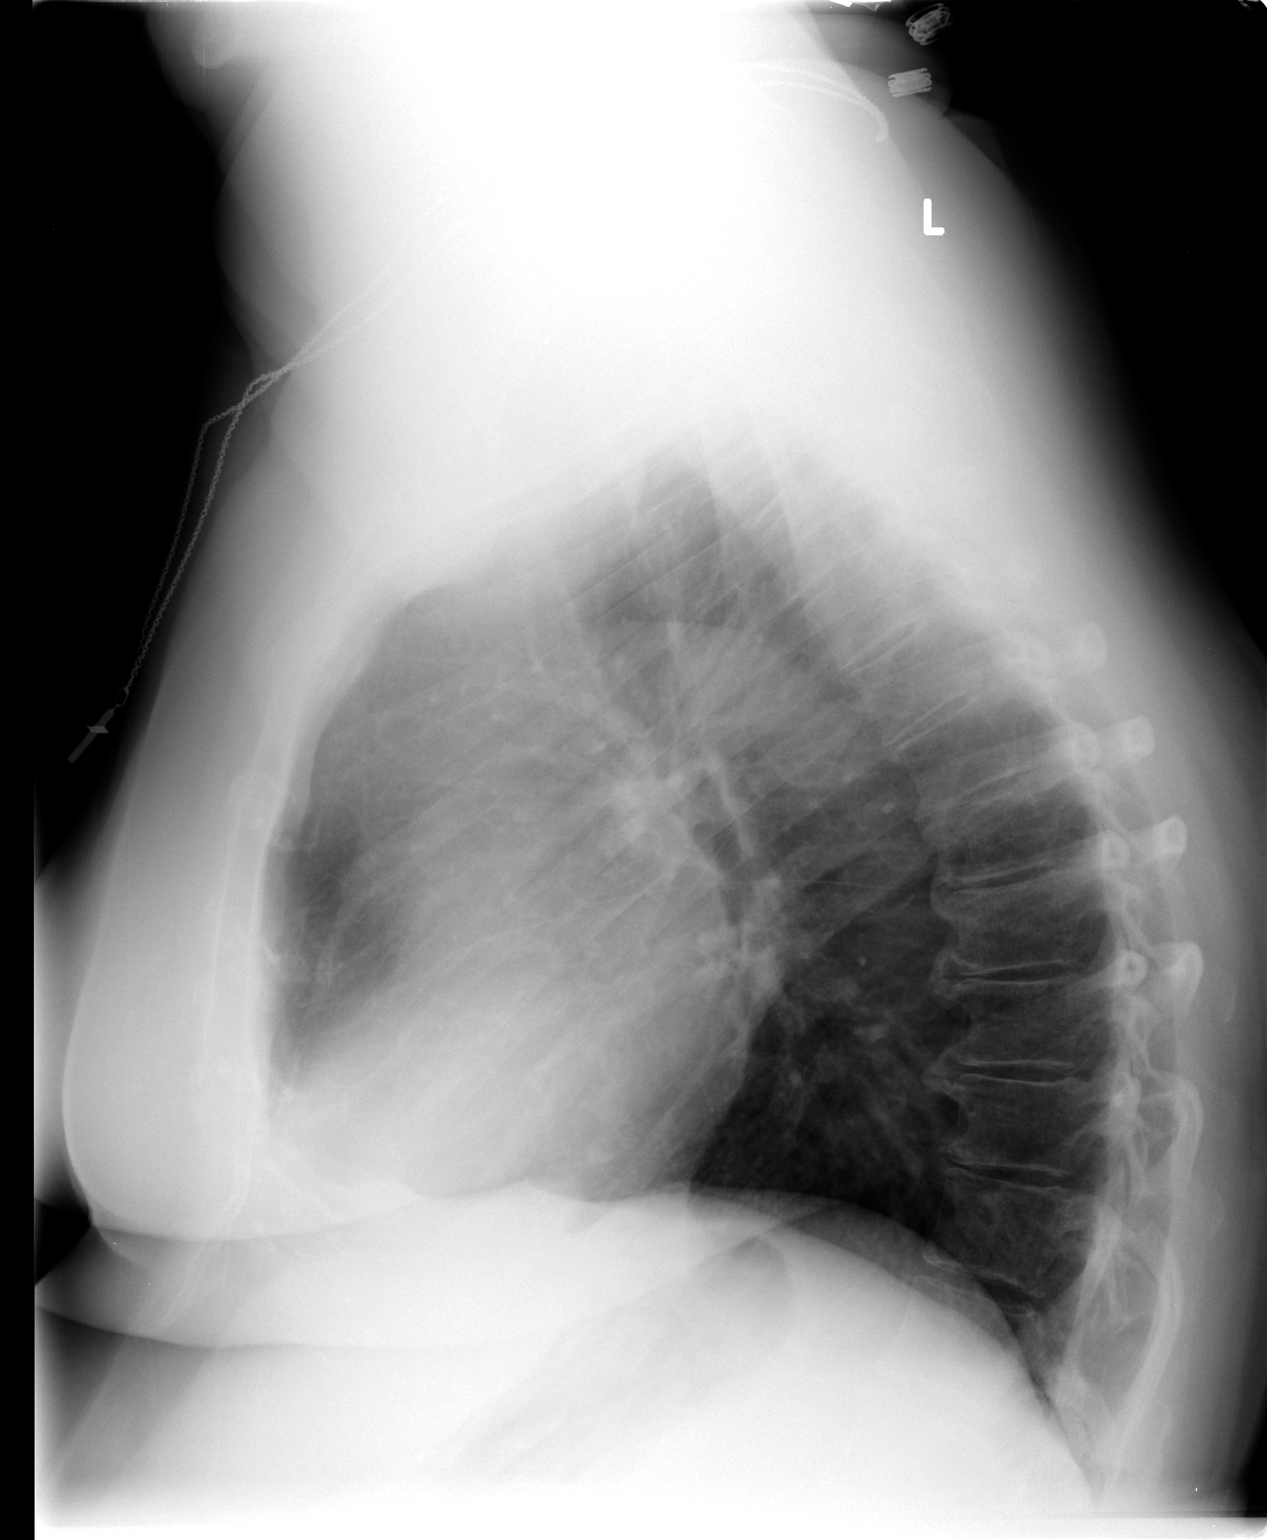

[2 of 2 positions shown; findings below may reference images not displayed]

FINDINGS: Normal cardiac size.  Clear lung fields.  Mild
degenerative change thoracic spine.  Mild hyperinflation.  No
effusion or pneumothorax.
IMPRESSION: No active disease. Little change from priors.

## 2012-02-10 ENCOUNTER — Other Ambulatory Visit (HOSPITAL_COMMUNITY): Payer: Self-pay | Admitting: Family Medicine

## 2012-02-10 DIAGNOSIS — Z139 Encounter for screening, unspecified: Secondary | ICD-10-CM

## 2012-02-13 ENCOUNTER — Ambulatory Visit (HOSPITAL_COMMUNITY)
Admission: RE | Admit: 2012-02-13 | Discharge: 2012-02-13 | Disposition: A | Discharge status: Home / Self Care | Payer: Medicare Other | Source: Ambulatory Visit | Attending: Family Medicine | Admitting: Family Medicine

## 2012-02-13 DIAGNOSIS — Z139 Encounter for screening, unspecified: Secondary | ICD-10-CM

## 2012-02-13 DIAGNOSIS — Z1231 Encounter for screening mammogram for malignant neoplasm of breast: Secondary | ICD-10-CM | POA: Insufficient documentation

## 2012-02-24 ENCOUNTER — Emergency Department (HOSPITAL_COMMUNITY): Payer: Medicare Other

## 2012-02-24 ENCOUNTER — Encounter (HOSPITAL_COMMUNITY): Payer: Self-pay

## 2012-02-24 ENCOUNTER — Emergency Department (HOSPITAL_COMMUNITY)
Admission: EM | Admit: 2012-02-24 | Discharge: 2012-02-24 | Disposition: A | Discharge status: Home / Self Care | Payer: Medicare Other | Attending: Emergency Medicine | Admitting: Emergency Medicine

## 2012-02-24 DIAGNOSIS — S0003XA Contusion of scalp, initial encounter: Secondary | ICD-10-CM | POA: Insufficient documentation

## 2012-02-24 DIAGNOSIS — I658 Occlusion and stenosis of other precerebral arteries: Secondary | ICD-10-CM | POA: Insufficient documentation

## 2012-02-24 DIAGNOSIS — E7800 Pure hypercholesterolemia, unspecified: Secondary | ICD-10-CM | POA: Insufficient documentation

## 2012-02-24 DIAGNOSIS — K219 Gastro-esophageal reflux disease without esophagitis: Secondary | ICD-10-CM | POA: Insufficient documentation

## 2012-02-24 DIAGNOSIS — Y9289 Other specified places as the place of occurrence of the external cause: Secondary | ICD-10-CM | POA: Insufficient documentation

## 2012-02-24 DIAGNOSIS — Z7982 Long term (current) use of aspirin: Secondary | ICD-10-CM | POA: Insufficient documentation

## 2012-02-24 DIAGNOSIS — E119 Type 2 diabetes mellitus without complications: Secondary | ICD-10-CM | POA: Insufficient documentation

## 2012-02-24 DIAGNOSIS — S93402A Sprain of unspecified ligament of left ankle, initial encounter: Secondary | ICD-10-CM

## 2012-02-24 DIAGNOSIS — W19XXXA Unspecified fall, initial encounter: Secondary | ICD-10-CM

## 2012-02-24 DIAGNOSIS — I252 Old myocardial infarction: Secondary | ICD-10-CM | POA: Insufficient documentation

## 2012-02-24 DIAGNOSIS — W101XXA Fall (on)(from) sidewalk curb, initial encounter: Secondary | ICD-10-CM | POA: Insufficient documentation

## 2012-02-24 DIAGNOSIS — Z794 Long term (current) use of insulin: Secondary | ICD-10-CM | POA: Insufficient documentation

## 2012-02-24 DIAGNOSIS — S0093XA Contusion of unspecified part of head, initial encounter: Secondary | ICD-10-CM

## 2012-02-24 DIAGNOSIS — S93409A Sprain of unspecified ligament of unspecified ankle, initial encounter: Secondary | ICD-10-CM | POA: Insufficient documentation

## 2012-02-24 DIAGNOSIS — I10 Essential (primary) hypertension: Secondary | ICD-10-CM | POA: Insufficient documentation

## 2012-02-24 DIAGNOSIS — M25579 Pain in unspecified ankle and joints of unspecified foot: Secondary | ICD-10-CM | POA: Insufficient documentation

## 2012-02-24 DIAGNOSIS — I6529 Occlusion and stenosis of unspecified carotid artery: Secondary | ICD-10-CM | POA: Insufficient documentation

## 2012-02-24 NOTE — ED Provider Notes (Signed)
History     CSN: [id number]  Arrival date & time 02/24/12  1740   First MD Initiated Contact with Patient 02/24/12 2021      Chief Complaint  Patient presents with  . Head Injury    (Consider location/radiation/quality/duration/timing/severity/associated sxs/prior treatment) HPI  Patient relates she had an echocardiogram done today at [facility] and as she was walking out going to the parking lot she tripped and fell on the curb and hit her head. This happened about 2:30 this afternoon. She denies loss of consciousness but states she's starting to have some headache that is mild. She also relates she has some mild discomfort in her left ankle. She denies been on Coumadin or any other blood thinner other than a baby aspirin a day. She denies blurred vision, nausea, or neck pain.  PCP Dr Orson Ape Cardiologist Dr Debara Pickett  Past Medical History  Diagnosis Date  . Back pain   . GERD (gastroesophageal reflux disease)   . MI (myocardial infarction)   . Hypercholesteremia   . Diabetes mellitus   . Anemia   . Cirrhosis   . Aortic sclerosis 07/04/2011  . Aortic insufficiency 07/04/2011  . Carotid stenosis, bilateral 07/04/2011  . OSA (obstructive sleep apnea) 07/04/2011  . Presence of stent in right coronary artery 07/04/2011  . HTN (hypertension) 07/04/2011    Past Surgical History  Procedure Date  . Eye surgery   . Back surgery   . Appendectomy   . Cardiac catheterization     Family History  Problem Relation Age of Onset  . Diabetes Mother     History  Substance Use Topics  . Smoking status: Current Everyday Smoker -- 0.2 packs/day    Types: Cigarettes  . Smokeless tobacco: Not on file  . Alcohol Use: No  lives at home Takes care of husband with alzheimers  OB History    Grav Para Term Preterm Abortions TAB SAB Ect Mult Living                  Review of Systems  All other systems reviewed and are negative.    Allergies  Tape  Home  Medications   Current Outpatient Rx  Name Route Sig Dispense Refill  . ASPIRIN EC 81 MG PO TBEC Oral Take 81 mg by mouth daily.      Marland Kitchen CARVEDILOL 6.25 MG PO TABS Oral Take 6.25 mg by mouth 2 (two) times daily with a meal.      . DIPHENHYDRAMINE-APAP (SLEEP) 25-500 MG PO TABS Oral Take 1 tablet by mouth at bedtime as needed. For sleep     . INSULIN GLARGINE 100 UNIT/ML [place] SOLN Subcutaneous Inject 35 Units into the skin at bedtime.     . INSULIN ISOPHANE & REGULAR (70-30) 100 UNIT/ML [place] SUSP Subcutaneous Inject 60 Units into the skin 2 (two) times daily with a meal.     . ISOSORBIDE MONONITRATE ER 30 MG PO TB24 Oral Take 30 mg by mouth daily.      Marland Kitchen LOSARTAN POTASSIUM-HCTZ 50-12.5 MG PO TABS Oral Take 1 tablet by mouth daily.      Marland Kitchen NITROFURANTOIN MACROCRYSTAL 50 MG PO CAPS Oral Take 50 mg by mouth daily.      Marland Kitchen OMEPRAZOLE 20 MG PO CPDR Oral Take 20 mg by mouth daily.      Marland Kitchen ROSUVASTATIN CALCIUM 10 MG PO TABS Oral Take 10 mg by mouth at bedtime.      Marland Kitchen TEMAZEPAM 15 MG  PO CAPS Oral Take 15 mg by mouth at bedtime.        BP 138/46  Pulse 76  Temp 97.9 F (36.6 C) (Oral)  Resp 16  Ht 5' 3"  (1.6 m)  Wt 200 lb (90.719 kg)  BMI 35.43 kg/m2  SpO2 95%  Vital signs normal    Physical Exam  Nursing note and vitals reviewed. Constitutional: She is oriented to person, place, and time. She appears well-developed and well-nourished.  Non-toxic appearance. She does not appear ill. No distress.  HENT:  Head: Normocephalic.  Right Ear: External ear normal.  Left Ear: External ear normal.  Nose: Nose normal. No mucosal edema or rhinorrhea.  Mouth/Throat: Oropharynx is clear and moist and mucous membranes are normal. No dental abscesses or uvula swelling.       Patient has large hematoma over her right forehead  Eyes: Conjunctivae and EOM are normal. Pupils are equal, round, and reactive to light.  Neck: Normal range of motion and full passive range of motion without pain. Neck supple.    Cardiovascular: Normal rate, regular rhythm and normal heart sounds.  Exam reveals no gallop and no friction rub.   No murmur heard. Pulmonary/Chest: Effort normal and breath sounds normal. No respiratory distress. She has no wheezes. She has no rhonchi. She has no rales. She exhibits no tenderness and no crepitus.  Abdominal: Normal appearance.  Musculoskeletal: Normal range of motion. She exhibits edema. She exhibits no tenderness.       Moves all extremities well. Patient has mild swelling over her lateral malleolus with minimal tenderness to palpation, and the medial malleolus is nontender as is her foot. She has good distal pulses. She has some numbness in the foot from prior back surgery.  Neurological: She is alert and oriented to person, place, and time. She has normal strength. No cranial nerve deficit.  Skin: Skin is warm, dry and intact. No rash noted. No erythema. No pallor.  Psychiatric: She has a normal mood and affect. Her speech is normal and behavior is normal. Her mood appears not anxious.    ED Course  Procedures (including critical care time)   Pt placed in ASO, denies needing crutches. We discussed having family call her periodically throughout the night to make sure she is okay since her husband has dementia and will be unable to observe her.  Labs Reviewed - No data to display Dg Ankle Complete Left  02/24/2012  *RADIOLOGY REPORT*  Clinical Data: Left ankle pain post fall  LEFT ANKLE COMPLETE - 3+ VIEW  Comparison: None.  Findings: Three views of the left ankle submitted.  Ankle mortise is preserved.  There is  soft tissue swelling laterally.  There is a small bony fragment just inferior to lateral malleolus.  Although this may be due to prior injury I cannot exclude a small avulsion fracture.  Clinical correlation is necessary.  IMPRESSION: There is a small bony fragment just inferior to lateral malleolus. Although this may be due to prior injury I cannot exclude a small  avulsion fracture.  Clinical correlation is necessary. Soft tissue swelling is noted laterally.  Original Report Authenticated By: Lahoma Crocker, M.D.   Ct Head Wo Contrast  02/24/2012  *RADIOLOGY REPORT*  Clinical Data: Fall.  Head injury  CT HEAD WITHOUT CONTRAST  Technique:  Contiguous axial images were obtained from the base of the skull through the vertex without contrast.  Comparison: MRI 06/27/2010  Findings: Moderately large scalp hematoma right frontal region.  No  skull fracture.  No intracranial hemorrhage or subdural collection.  Mild frontal atrophy bilaterally.  No acute infarct or mass.  IMPRESSION: Right frontal scalp hematoma.  No   acute intracranial abnormality.  Original Report Authenticated By: Truett Perna, M.D.     1. Fall   2. Contusion of head   3. Sprain of ankle, left    Plan discharge  Rolland Porter, MD, [place], MD 02/24/12 2128

## 2012-02-24 NOTE — ED Notes (Signed)
Notified Dr. Tomi Bamberger of pt and was instructed to order CT scan and ankle x ray.  Ice pack given to pt and instructed pt to notify staff if starts to feel any worse or different symptoms arise.

## 2012-02-24 NOTE — Discharge Instructions (Signed)
Elevate your foot. Use ice packs to the swollen areas. Take acetaminophen for pain. Wear the ankle support for the next couple of weeks. Have Dr Luna Glasgow recheck your ankle if you continue to have pain over the next week. Return to the ED for any problems listed on the head injury sheet.

## 2012-02-24 NOTE — ED Notes (Signed)
Pt says missed the curb in a parking lot and fell hitting head on concrete.  Also c/o pain to left ankle.   Pt Denies any loss of consciousness.  Pt says she fell around 2:30 today.  Pt says just now is starting to have a headache.  Denies any vomiting but says is having some blurred vision.

## 2012-09-05 IMAGING — MG MM DIGITAL SCREENING
4 series · 4 of 4 positions shown · non-contrast
Comparison: none

CLINICAL DATA: Screening.

MAMMOGRAPHIC BILATERAL DIGITAL SCREENING WITH CAD

[L CC]
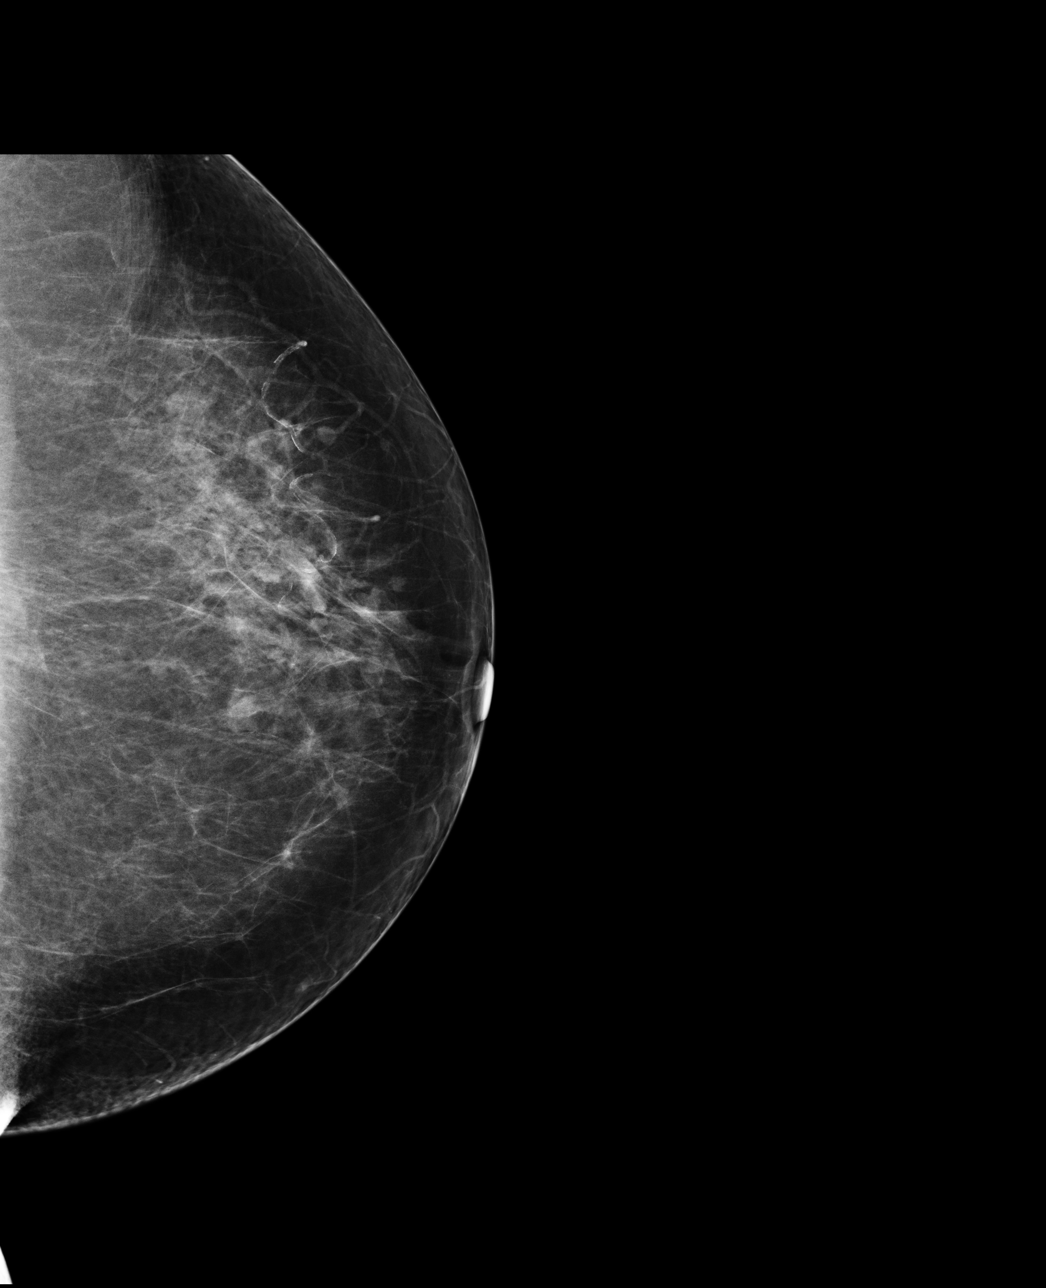

[L MLO]
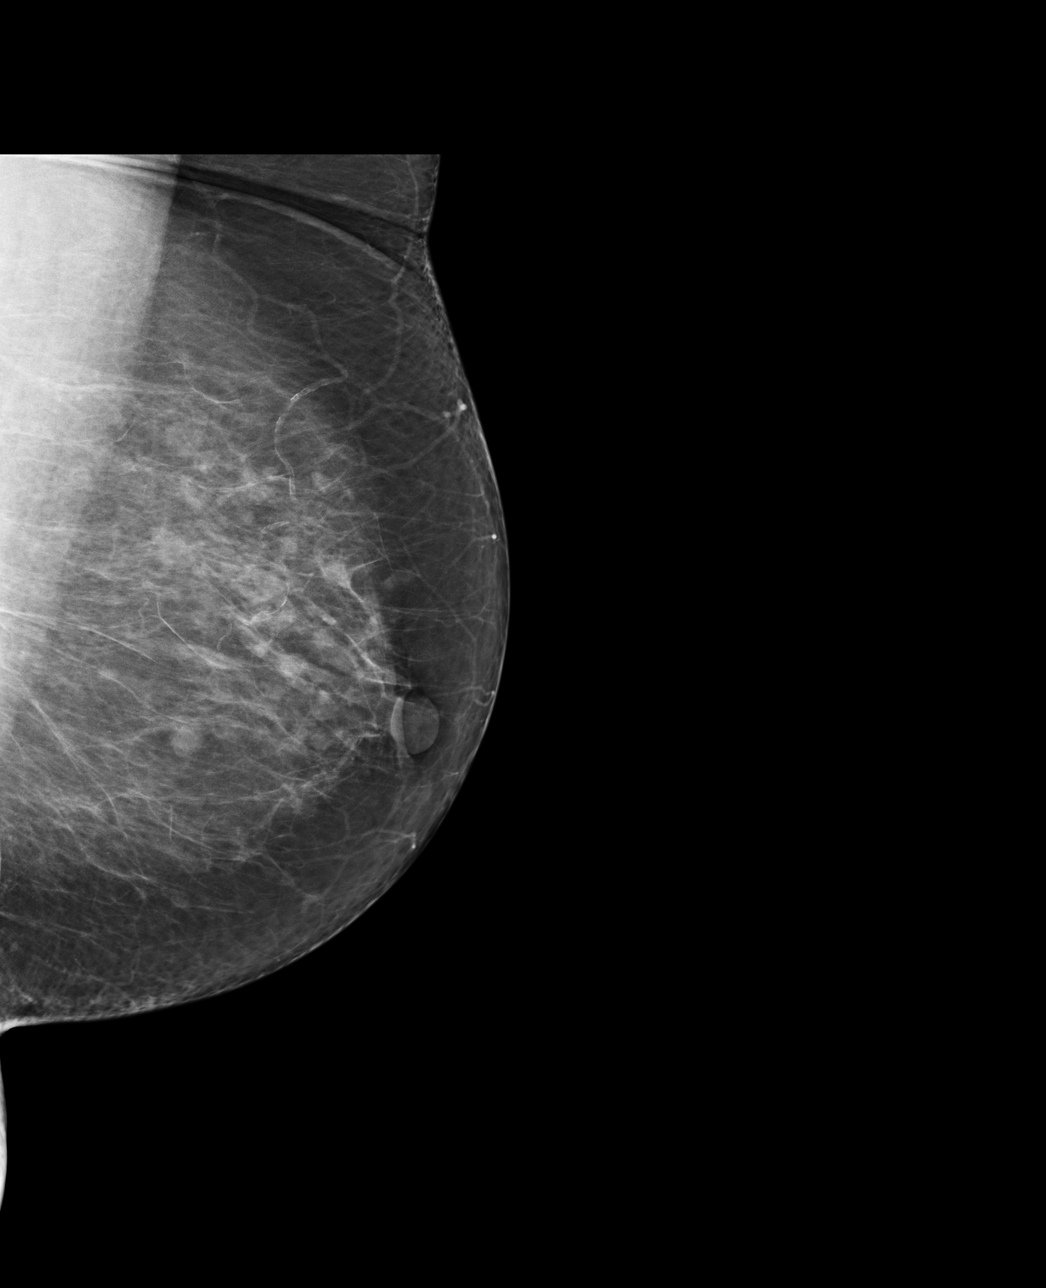

[R CC]
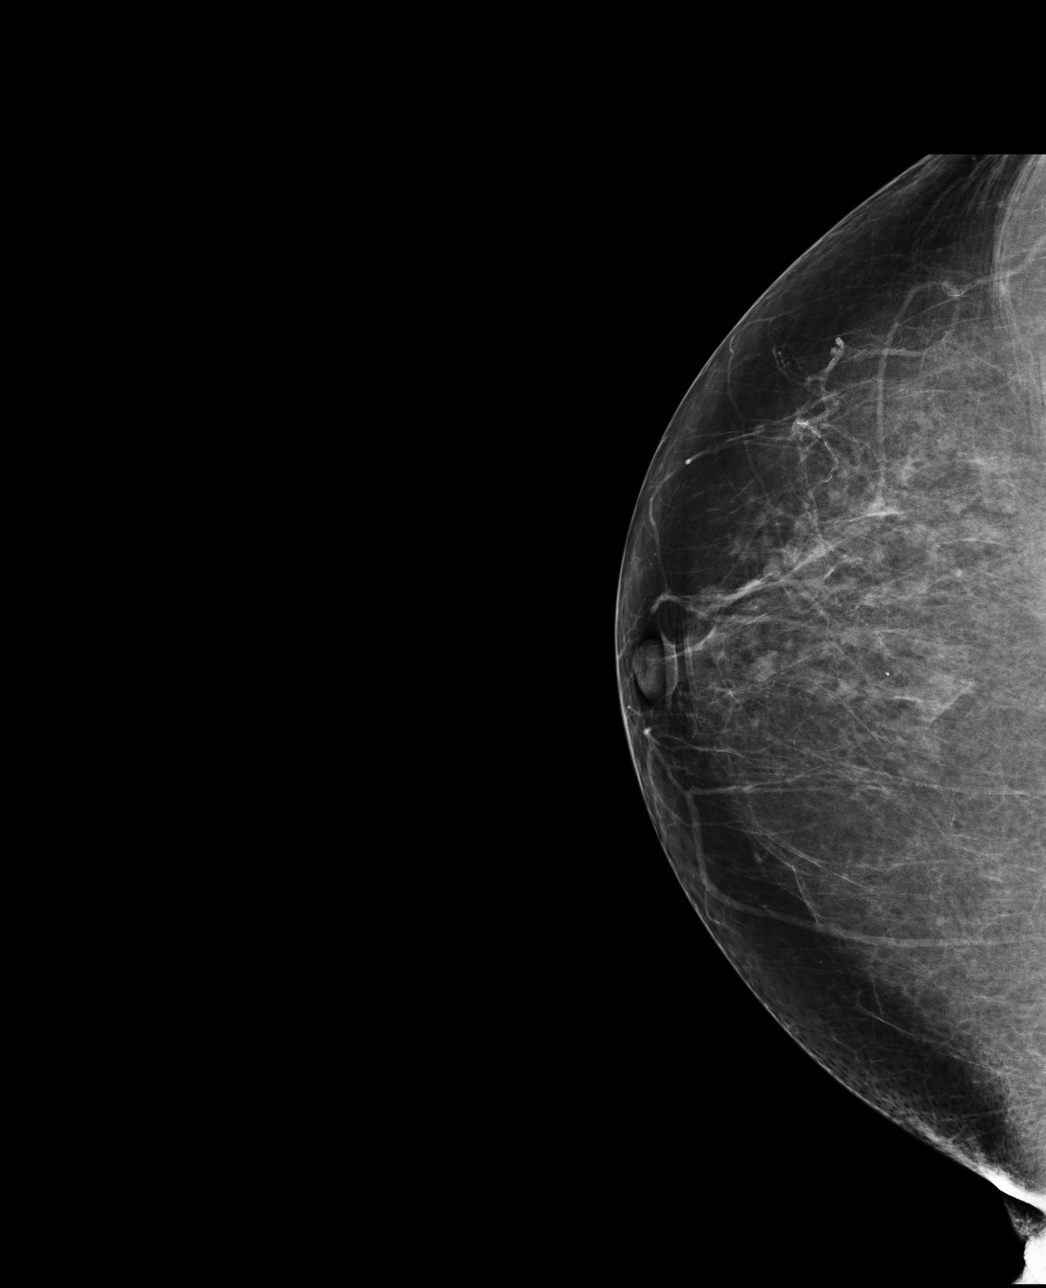

[R MLO]
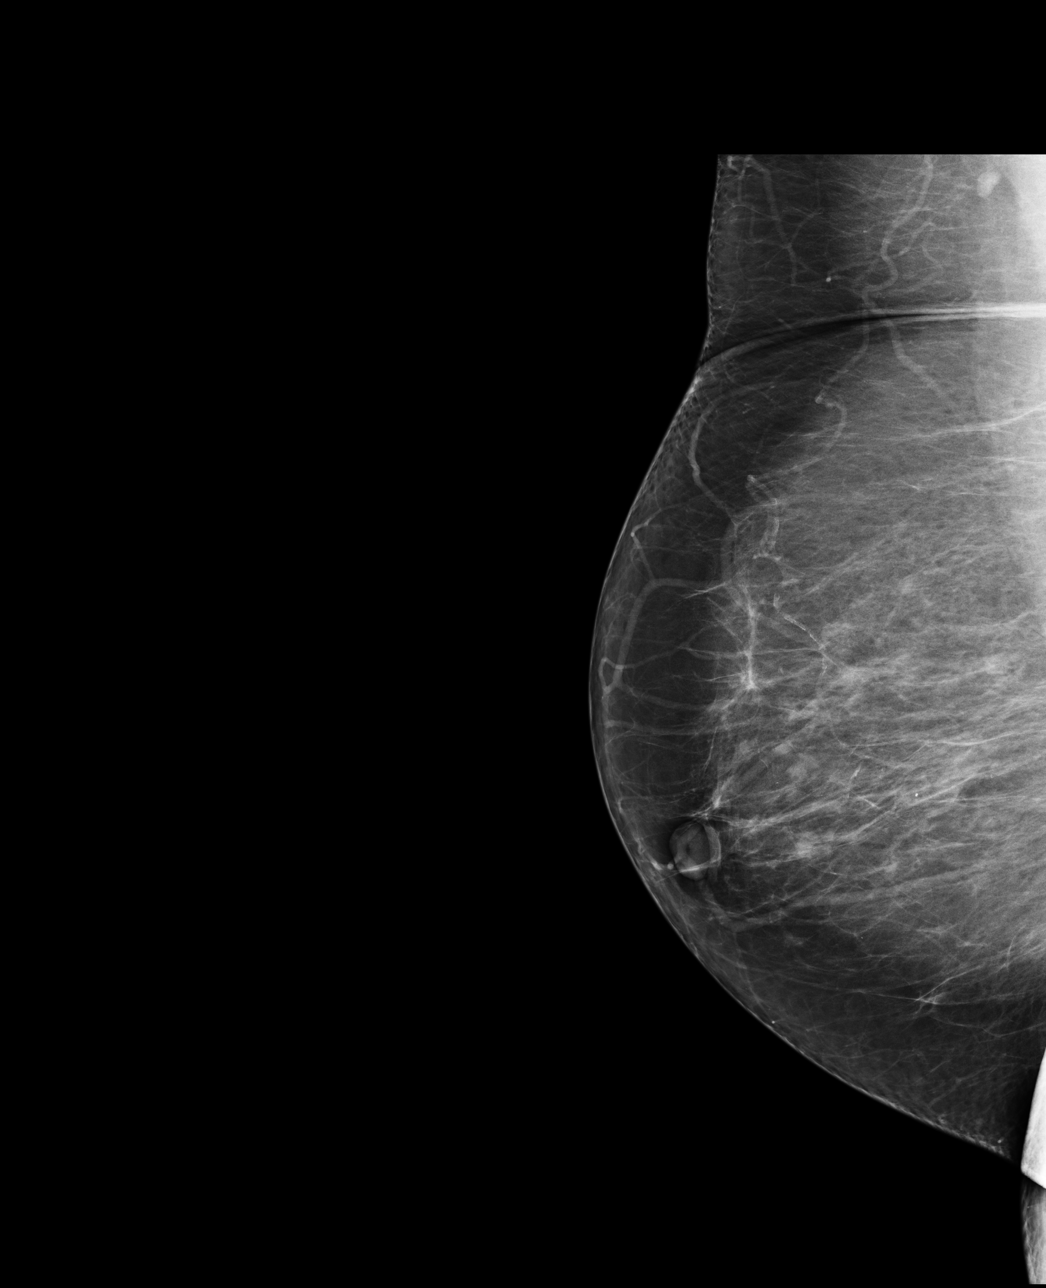

[4 of 4 positions shown; findings below may reference images not displayed]

FINDINGS: There are scattered fibroglandular densities.  No
suspicious masses or malignant type calcifications are identified.
Compared with prior studies.

Images were processed with CAD.
IMPRESSION: No specific mammographic evidence of malignancy.

A result letter of this screening mammogram will be mailed directly
to the patient.

RECOMMENDATION:
Screening mammogram in one year. (Code:P3-9-B9A)

BI-RADS CATEGORY 1:  Negative

## 2012-09-16 IMAGING — CT CT HEAD W/O CM
1 series · 16 of 30 positions shown, 20 images · non-contrast
Comparison: MRI 06/27/2010

CLINICAL DATA: Fall.  Head injury

CT HEAD WITHOUT CONTRAST
TECHNIQUE: Contiguous axial images were obtained from the base of
the skull through the vertex without contrast.

[Series 2: headtrauma 4.8 h37s · axial · 0.43mm/px · z∈[+1162,+1295]mm · 16 of 30 slices shown, 20 images]
[im 2/30  brain]
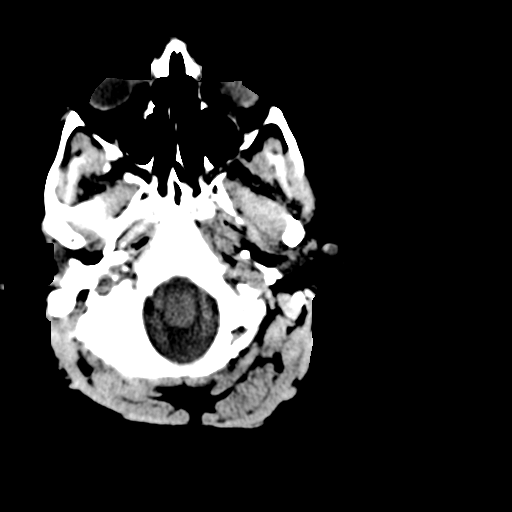
[im 2/30  bone]
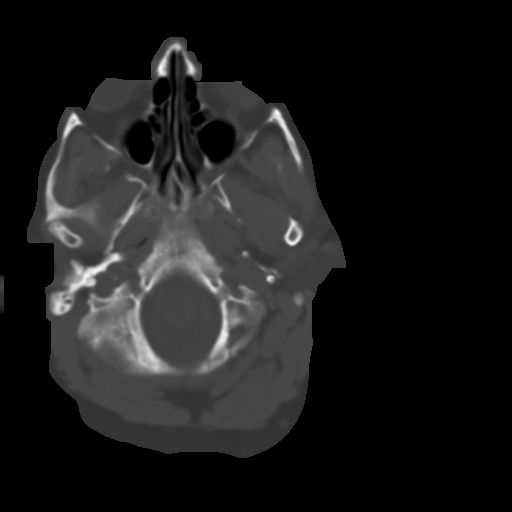
[im 4/30  brain]
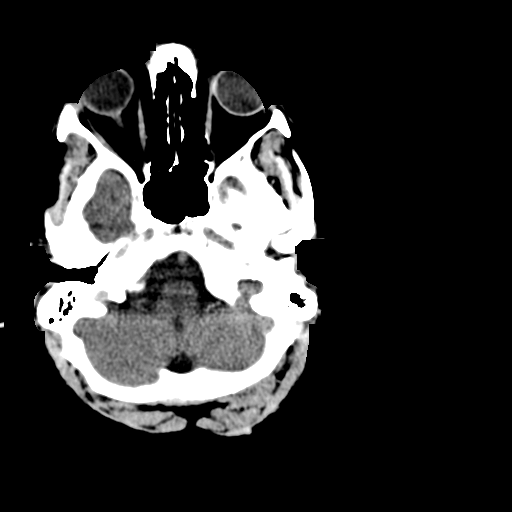
[im 6/30  brain]
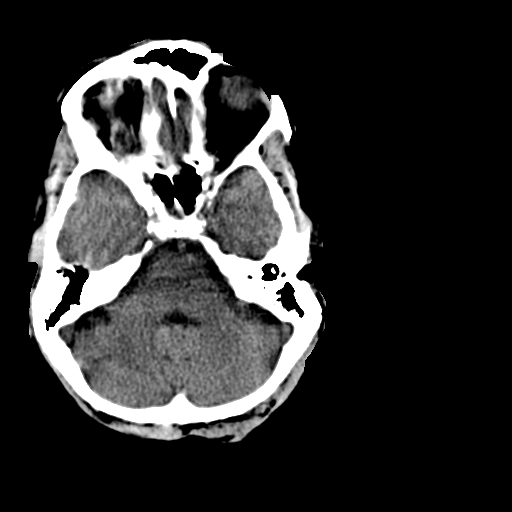
[im 8/30  brain]
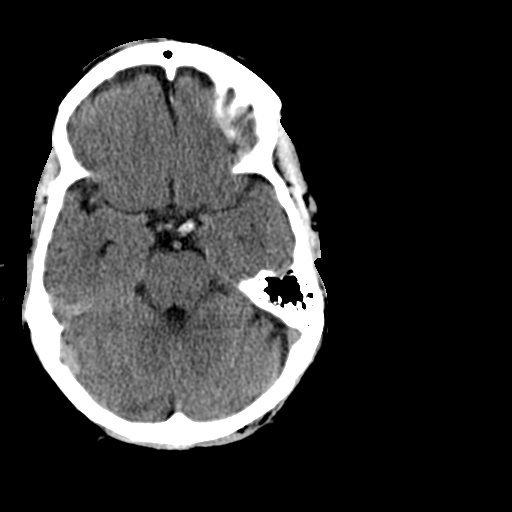
[im 9/30  brain]
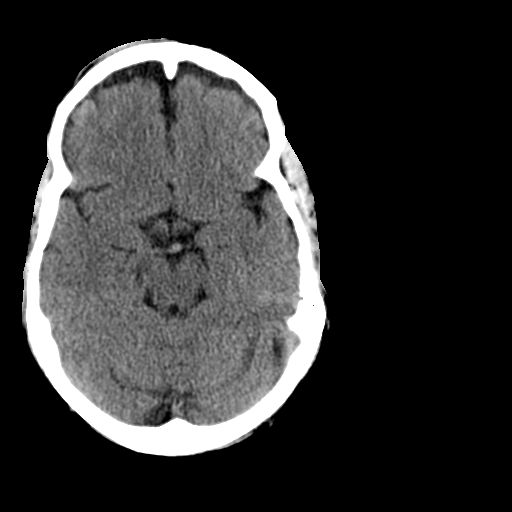
[im 9/30  bone]
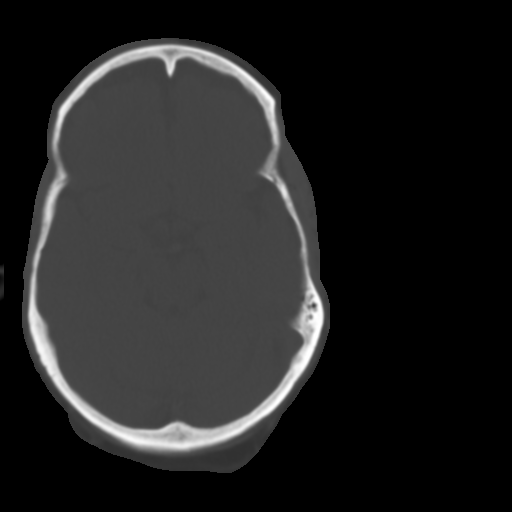
[im 11/30  brain]
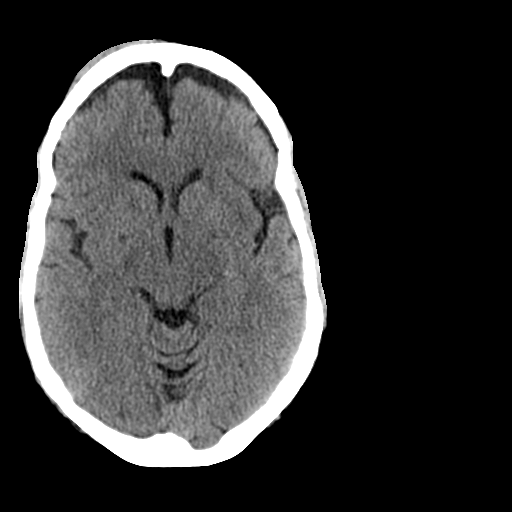
[im 13/30  brain]
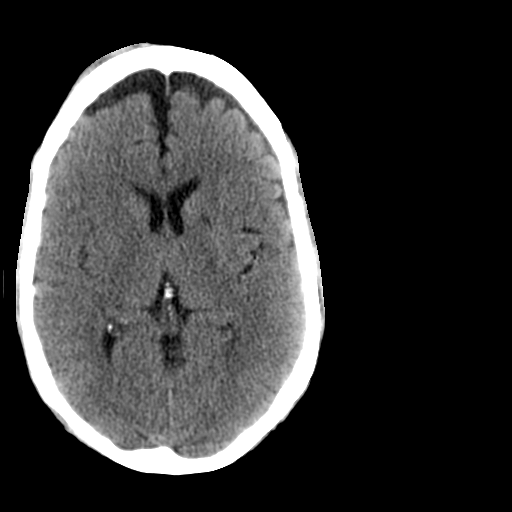
[im 15/30  brain]
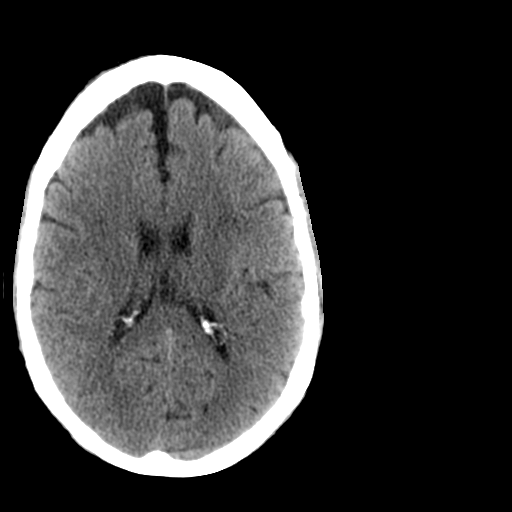
[im 16/30  brain]
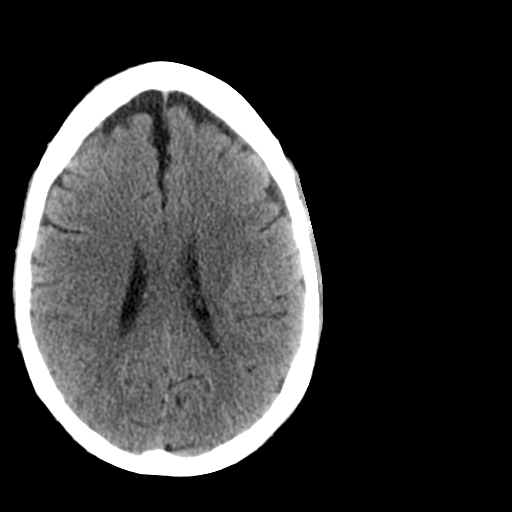
[im 16/30  bone]
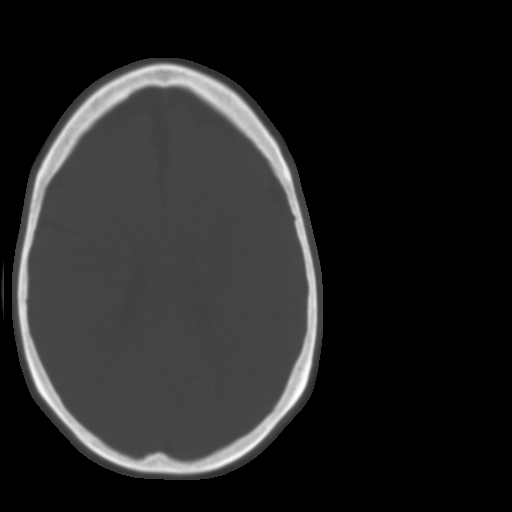
[im 18/30  brain]
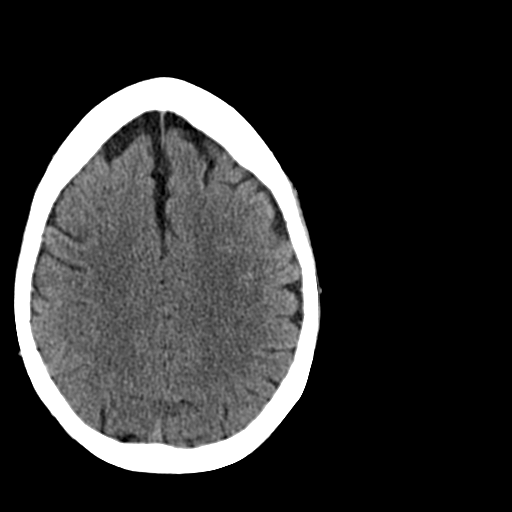
[im 20/30  brain]
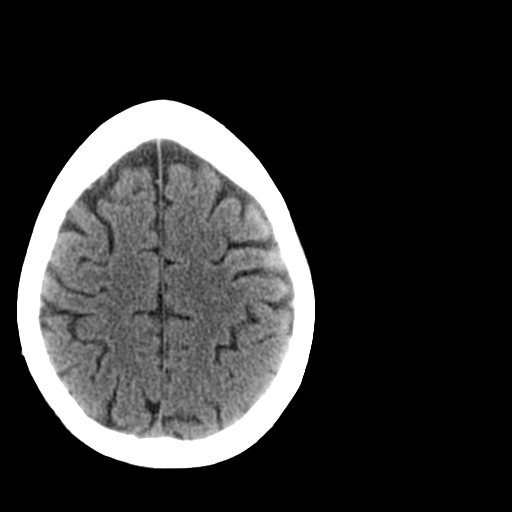
[im 22/30  brain]
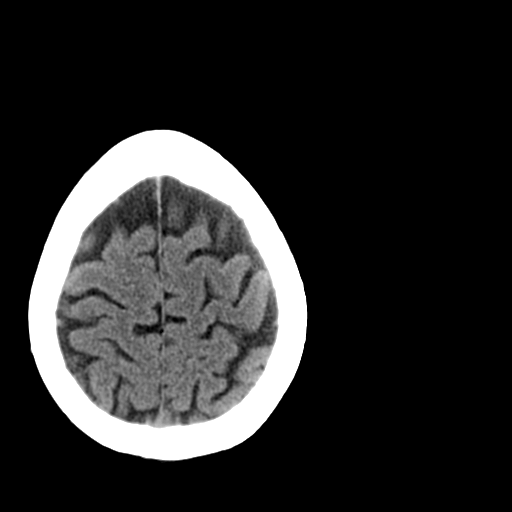
[im 23/30  brain]
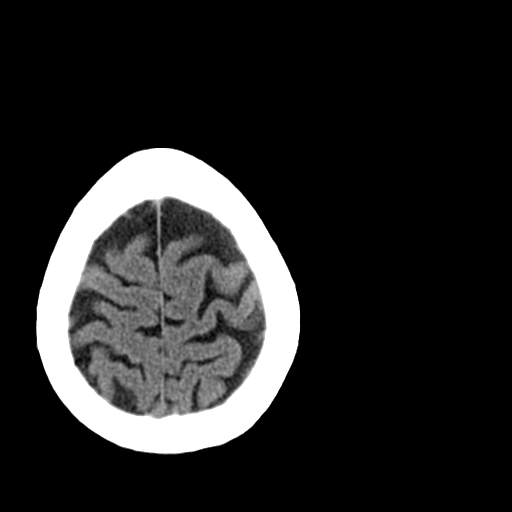
[im 23/30  bone]
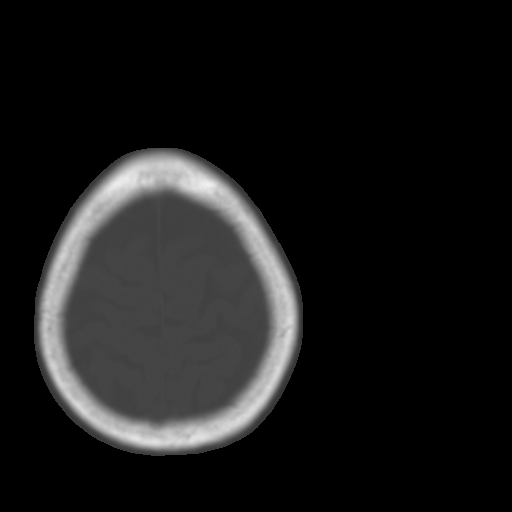
[im 25/30  brain]
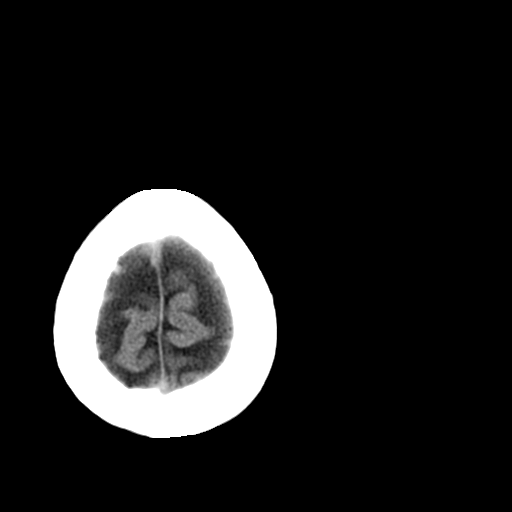
[im 27/30  brain]
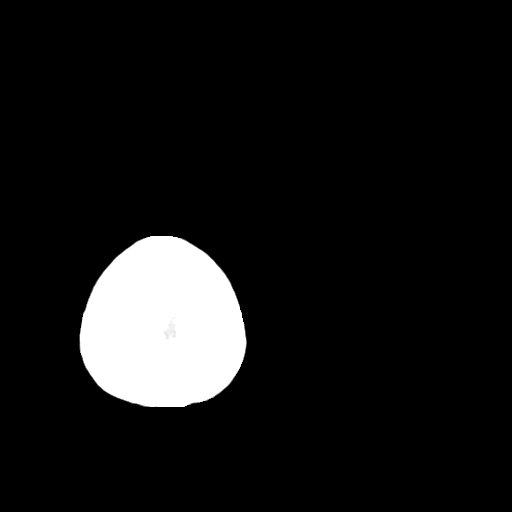
[im 29/30  brain]
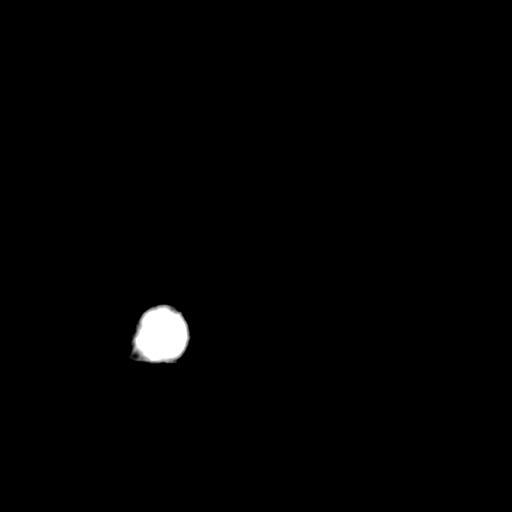

[16 of 30 positions shown; findings below may reference images not displayed]

FINDINGS: Moderately large scalp hematoma right frontal region.  No
skull fracture.

No intracranial hemorrhage or subdural collection.  Mild frontal
atrophy bilaterally.  No acute infarct or mass.
IMPRESSION: Right frontal scalp hematoma.  No   acute intracranial abnormality.

## 2012-09-16 IMAGING — CR DG ANKLE COMPLETE 3+V*L*
3 series · 3 of 3 positions shown · non-contrast
Comparison: None.

CLINICAL DATA: Left ankle pain post fall

LEFT ANKLE COMPLETE - 3+ VIEW

[view not recorded (1 of 3)]
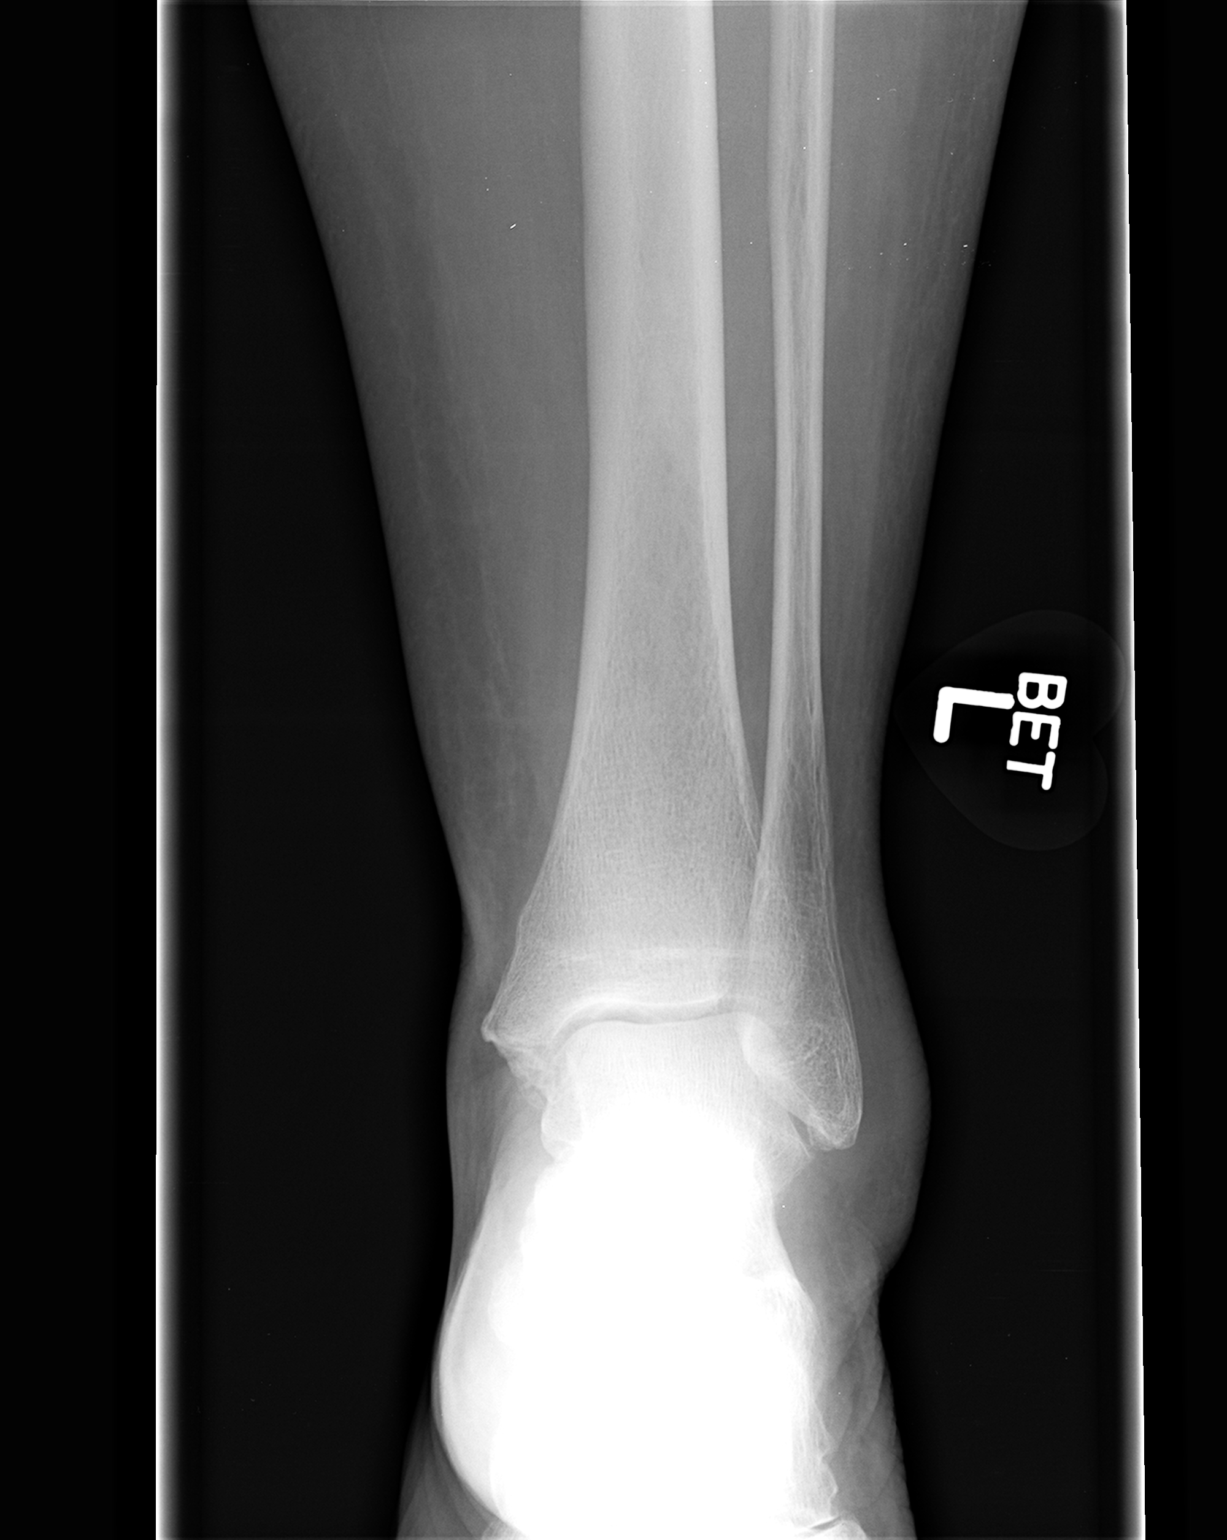

[view not recorded (2 of 3)]
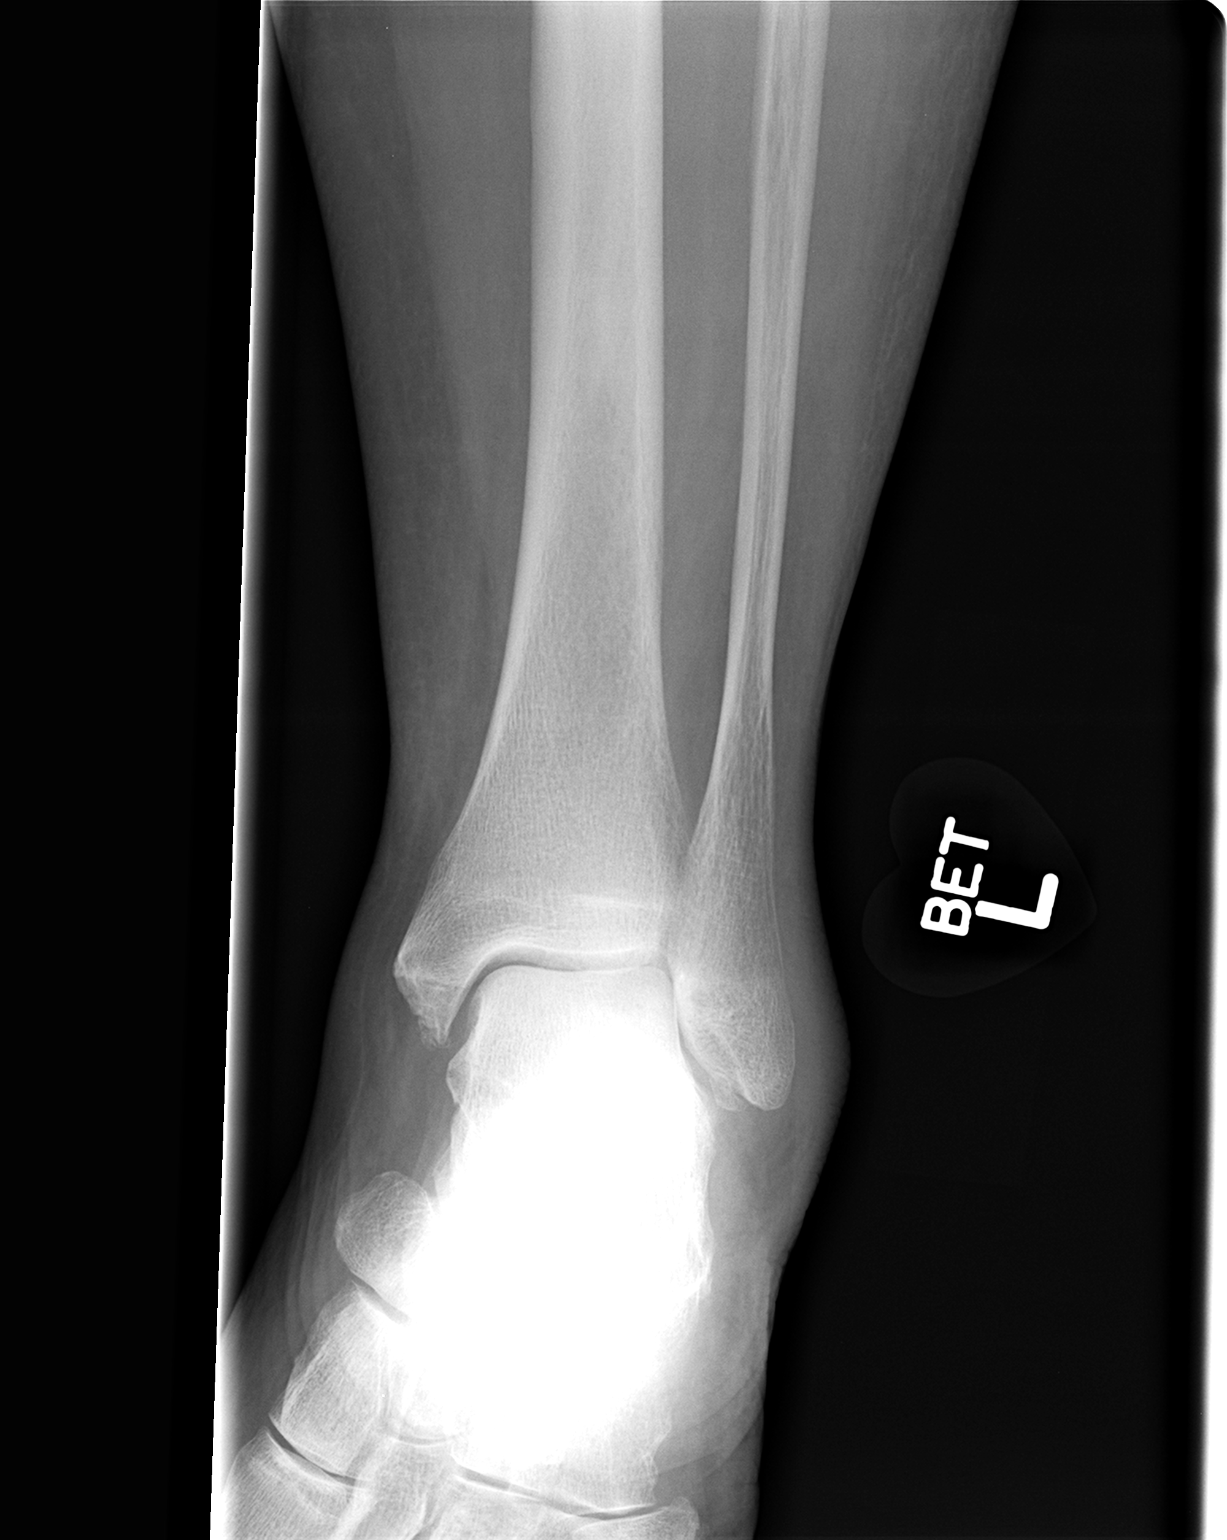

[view not recorded (3 of 3)]
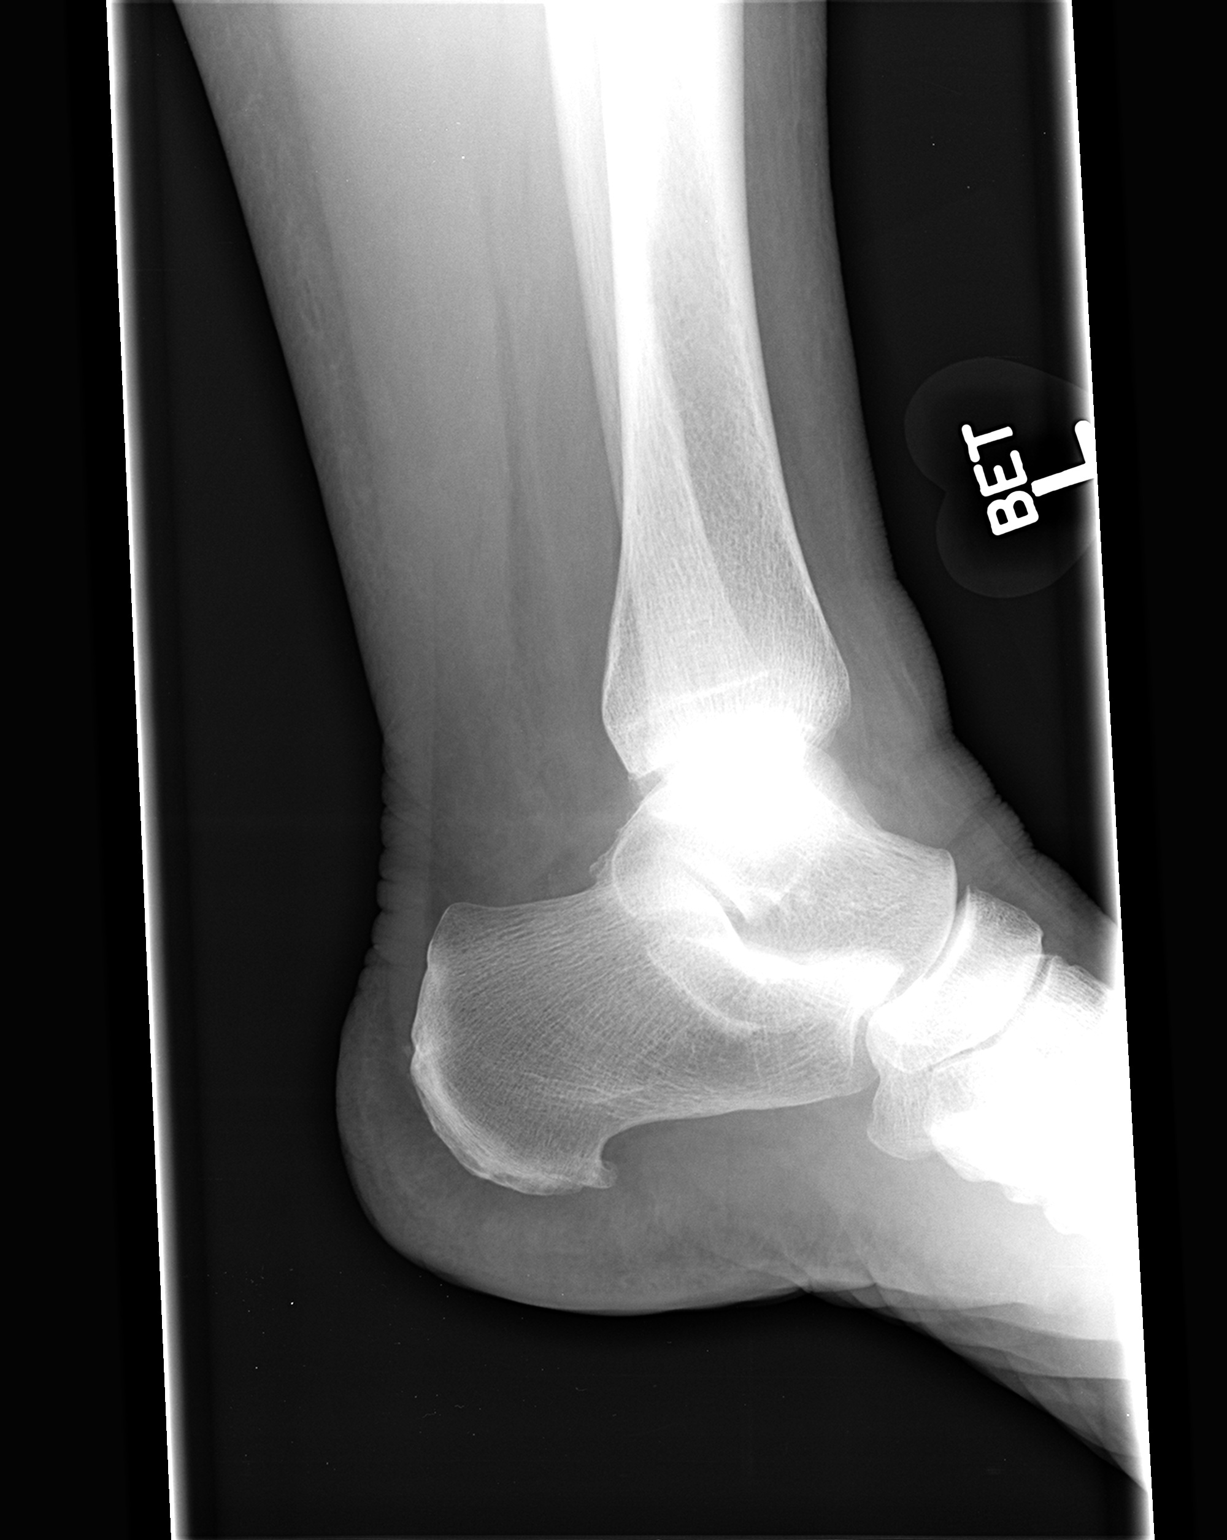

[3 of 3 positions shown; findings below may reference images not displayed]

FINDINGS: Three views of the left ankle submitted.  Ankle mortise
is preserved.  There is  soft tissue swelling laterally.  There is
a small bony fragment just inferior to lateral malleolus.  Although
this may be due to prior injury I cannot exclude a small avulsion
fracture.  Clinical correlation is necessary.
IMPRESSION: There is a small bony fragment just inferior to] lateral malleolus.
Although this may be due to prior injury I cannot exclude a small
avulsion fracture.  Clinical correlation is necessary. Soft tissue
swelling is noted laterally.

## 2012-10-30 ENCOUNTER — Emergency Department (HOSPITAL_COMMUNITY)
Admission: EM | Admit: 2012-10-30 | Discharge: 2012-10-30 | Disposition: A | Discharge status: Home / Self Care | Payer: Medicare Other | Attending: Emergency Medicine | Admitting: Emergency Medicine

## 2012-10-30 ENCOUNTER — Encounter (HOSPITAL_COMMUNITY): Payer: Self-pay | Admitting: *Deleted

## 2012-10-30 DIAGNOSIS — Z8739 Personal history of other diseases of the musculoskeletal system and connective tissue: Secondary | ICD-10-CM | POA: Insufficient documentation

## 2012-10-30 DIAGNOSIS — M549 Dorsalgia, unspecified: Secondary | ICD-10-CM | POA: Insufficient documentation

## 2012-10-30 DIAGNOSIS — Z79899 Other long term (current) drug therapy: Secondary | ICD-10-CM | POA: Insufficient documentation

## 2012-10-30 DIAGNOSIS — Z7982 Long term (current) use of aspirin: Secondary | ICD-10-CM | POA: Insufficient documentation

## 2012-10-30 DIAGNOSIS — E7800 Pure hypercholesterolemia, unspecified: Secondary | ICD-10-CM | POA: Insufficient documentation

## 2012-10-30 DIAGNOSIS — G4733 Obstructive sleep apnea (adult) (pediatric): Secondary | ICD-10-CM | POA: Insufficient documentation

## 2012-10-30 DIAGNOSIS — R112 Nausea with vomiting, unspecified: Secondary | ICD-10-CM | POA: Insufficient documentation

## 2012-10-30 DIAGNOSIS — K219 Gastro-esophageal reflux disease without esophagitis: Secondary | ICD-10-CM | POA: Insufficient documentation

## 2012-10-30 DIAGNOSIS — Z87891 Personal history of nicotine dependence: Secondary | ICD-10-CM | POA: Insufficient documentation

## 2012-10-30 DIAGNOSIS — E119 Type 2 diabetes mellitus without complications: Secondary | ICD-10-CM | POA: Insufficient documentation

## 2012-10-30 DIAGNOSIS — Z862 Personal history of diseases of the blood and blood-forming organs and certain disorders involving the immune mechanism: Secondary | ICD-10-CM | POA: Insufficient documentation

## 2012-10-30 DIAGNOSIS — R05 Cough: Secondary | ICD-10-CM | POA: Insufficient documentation

## 2012-10-30 DIAGNOSIS — Z9861 Coronary angioplasty status: Secondary | ICD-10-CM | POA: Insufficient documentation

## 2012-10-30 DIAGNOSIS — Z794 Long term (current) use of insulin: Secondary | ICD-10-CM | POA: Insufficient documentation

## 2012-10-30 DIAGNOSIS — R059 Cough, unspecified: Secondary | ICD-10-CM | POA: Insufficient documentation

## 2012-10-30 DIAGNOSIS — Z8679 Personal history of other diseases of the circulatory system: Secondary | ICD-10-CM | POA: Insufficient documentation

## 2012-10-30 DIAGNOSIS — I252 Old myocardial infarction: Secondary | ICD-10-CM | POA: Insufficient documentation

## 2012-10-30 DIAGNOSIS — Z8719 Personal history of other diseases of the digestive system: Secondary | ICD-10-CM | POA: Insufficient documentation

## 2012-10-30 DIAGNOSIS — I10 Essential (primary) hypertension: Secondary | ICD-10-CM | POA: Insufficient documentation

## 2012-10-30 LAB — CBC WITH DIFFERENTIAL/PLATELET
HCT: 34.6 % — ABNORMAL LOW (ref 36.0–46.0)
Hemoglobin: 11.4 g/dL — ABNORMAL LOW (ref 12.0–15.0)
Lymphocytes Relative: 15 % (ref 12–46)
MCV: 88.3 fL (ref 78.0–100.0)
Monocytes Absolute: 0.7 10*3/uL (ref 0.1–1.0)
Monocytes Relative: 7 % (ref 3–12)
Neutro Abs: 7.2 10*3/uL (ref 1.7–7.7)
WBC: 9.5 10*3/uL (ref 4.0–10.5)

## 2012-10-30 LAB — COMPREHENSIVE METABOLIC PANEL
AST: 31 U/L (ref 0–37)
BUN: 30 mg/dL — ABNORMAL HIGH (ref 6–23)
CO2: 22 mEq/L (ref 19–32)
Chloride: 101 mEq/L (ref 96–112)
Creatinine, Ser: 1.6 mg/dL — ABNORMAL HIGH (ref 0.50–1.10)
GFR calc Af Amer: 37 mL/min — ABNORMAL LOW (ref >90)
GFR calc non Af Amer: 32 mL/min — ABNORMAL LOW (ref >90)
Glucose, Bld: 122 mg/dL — ABNORMAL HIGH (ref 70–99)
Total Bilirubin: 0.4 mg/dL (ref 0.3–1.2)

## 2012-10-30 MED ORDER — PROMETHAZINE HCL 25 MG PO TABS: 25.0000 mg | ORAL_TABLET | Freq: Four times a day (QID) | ORAL | Status: DC | PRN

## 2012-10-30 MED ORDER — PROMETHAZINE HCL 25 MG/ML IJ SOLN
25.0000 mg | Freq: Once | INTRAMUSCULAR | Status: AC
  Administered 2012-10-30: 25 mg via INTRAVENOUS
  Filled 2012-10-30: qty 1

## 2012-10-30 MED ORDER — ONDANSETRON HCL 4 MG/2ML IJ SOLN
4.0000 mg | Freq: Once | INTRAMUSCULAR | Status: AC
  Administered 2012-10-30: 4 mg via INTRAVENOUS
  Filled 2012-10-30: qty 2

## 2012-10-30 MED ORDER — SODIUM CHLORIDE 0.9 % IV BOLUS (SEPSIS)
500.0000 mL | Freq: Once | INTRAVENOUS | Status: AC
  Administered 2012-10-30: 500 mL via INTRAVENOUS

## 2012-10-30 NOTE — ED Notes (Signed)
Pt c/o cough since Tuesday. Pt states she has had n/v since last night and also has pain that runs from the base of her neck, down her legs.

## 2012-10-30 NOTE — ED Notes (Signed)
Given 12.5 of phenergan and will reassess to see if she needs the other half. Dr Roderic Palau aware and states OK.

## 2012-10-30 NOTE — ED Notes (Signed)
Dr Roderic Palau notified that patient was experiencing episodes of emesis and dry heaving. States to give 21m phenergan IV. Pt informed that it may make her sleepy but she states she didn't mind and "I just want this nausea to go away"

## 2012-10-30 NOTE — ED Provider Notes (Signed)
History    This chart was scribed for Maudry Diego, MD by Malen Gauze, ED Scribe. The patient was seen in room [facility] and the patient's care was started at 7:42PM.    CSN: [id number]  Arrival date & time 10/30/12  [name]   First MD Initiated Contact with Patient 10/30/12 1940      Chief Complaint  Patient presents with  . Cough  . Nausea  . Emesis    (Consider location/radiation/quality/duration/timing/severity/associated sxs/prior treatment) Patient is a 72 y.o. female presenting with vomiting. The history is provided by the patient. No language interpreter was used.  Emesis Severity:  Moderate Duration:  1 day Timing:  Intermittent Number of daily episodes:  Unknown Quality:  Stomach contents Feeding tolerance: neither solids and liquids. Progression:  Worsening Chronicity:  New Relieved by:  Nothing Worsened by:  Nothing tried Ineffective treatments:  None tried Associated symptoms: cough   Associated symptoms: no diarrhea   [name] is a 72 y.o. female who presents to the Emergency Department complaining of persistent nausea and emesis with an onset last night. She reports that she has had a cough since 4 days ago; she was prescribed levaquin and benzonatate which alleviated the cough; cough is no longer present. However, now she reports emesis; she has not kept up with how many times she has vomited. She reports she cannot keep fluids down. No known allergies to medications. Hx of DM and takes long acting insulin. No other pertinent medical symptoms.   Past Medical History  Diagnosis Date  . Back pain   . GERD (gastroesophageal reflux disease)   . MI (myocardial infarction)   . Hypercholesteremia   . Diabetes mellitus   . Anemia   . Cirrhosis   . Aortic sclerosis 07/04/2011  . Aortic insufficiency 07/04/2011  . Carotid stenosis, bilateral 07/04/2011  . OSA (obstructive sleep apnea) 07/04/2011  . Presence of stent in right coronary artery  07/04/2011  . HTN (hypertension) 07/04/2011    Past Surgical History  Procedure Laterality Date  . Eye surgery    . Back surgery    . Appendectomy    . Cardiac catheterization      Family History  Problem Relation Age of Onset  . Diabetes Mother     History  Substance Use Topics  . Smoking status: Former Smoker -- 0.25 packs/day    Types: Cigarettes  . Smokeless tobacco: Not on file  . Alcohol Use: No    OB History   Grav Para Term Preterm Abortions TAB SAB Ect Mult Living                  Review of Systems  Constitutional: Negative for fever.  Respiratory: Positive for cough.   Gastrointestinal: Positive for nausea and vomiting. Negative for diarrhea.  All other systems reviewed and are negative.   Allergies  Tape  Home Medications   Current Outpatient Rx  Name  Route  Sig  Dispense  Refill  . aspirin EC 81 MG tablet   Oral   Take 81 mg by mouth daily.           . carvedilol (COREG) 6.25 MG tablet   Oral   Take 6.25 mg by mouth 2 (two) times daily with a meal.           . diphenhydramine-acetaminophen (TYLENOL PM) 25-500 MG TABS   Oral   Take 1 tablet by mouth at bedtime as needed. For sleep          .  insulin glargine (LANTUS SOLOSTAR) 100 UNIT/ML injection   Subcutaneous   Inject 35 Units into the skin at bedtime.          . insulin NPH-insulin regular (NOVOLIN 70/30) (70-30) 100 UNIT/ML injection   Subcutaneous   Inject 60 Units into the skin 2 (two) times daily with a meal.          . isosorbide mononitrate (IMDUR) 30 MG 24 hr tablet   Oral   Take 30 mg by mouth daily.           Marland Kitchen losartan-hydrochlorothiazide (HYZAAR) 50-12.5 MG per tablet   Oral   Take 1 tablet by mouth daily.           . nitrofurantoin (MACRODANTIN) 50 MG capsule   Oral   Take 50 mg by mouth daily.           Marland Kitchen omeprazole (PRILOSEC) 20 MG capsule   Oral   Take 20 mg by mouth daily.           . rosuvastatin (CRESTOR) 10 MG tablet   Oral   Take  10 mg by mouth at bedtime.           . temazepam (RESTORIL) 15 MG capsule   Oral   Take 15 mg by mouth at bedtime.             BP 144/50  Pulse 75  Temp(Src) 98.6 F (37 C)  Resp 20  Ht 5' 3"  (1.6 m)  Wt 215 lb (97.523 kg)  BMI 38.09 kg/m2  SpO2 97%  Physical Exam  Nursing note and vitals reviewed. Constitutional: She is oriented to person, place, and time. She appears well-developed.  HENT:  Head: Normocephalic and atraumatic.  Eyes: Conjunctivae and EOM are normal. No scleral icterus.  Neck: Neck supple. No thyromegaly present.  Cardiovascular: Normal rate and regular rhythm.  Exam reveals no gallop and no friction rub.   No murmur heard. Pulmonary/Chest: No stridor. She has no wheezes. She has no rales. She exhibits no tenderness.  Abdominal: She exhibits no distension. There is no tenderness. There is no rebound.  Musculoskeletal: Normal range of motion. She exhibits no edema.  Lymphadenopathy:    She has no cervical adenopathy.  Neurological: She is oriented to person, place, and time. Coordination normal.  Skin: No rash noted. No erythema.  Psychiatric: She has a normal mood and affect. Her behavior is normal.    ED Course  Procedures (including critical care time)  DIAGNOSTIC STUDIES: Oxygen Saturation is 97% on room air, normal by my interpretation.    COORDINATION OF CARE:  7:45PM - IV fluids, Zofran, CBC with differential, and CMP will be ordered for [facility].    Labs Reviewed - No data to display No results found.   No diagnosis found.    MDM   Pt improved with tx        Maudry Diego, MD 10/30/12 2112

## 2012-12-06 ENCOUNTER — Encounter (HOSPITAL_COMMUNITY): Payer: Self-pay | Admitting: *Deleted

## 2012-12-06 ENCOUNTER — Emergency Department (HOSPITAL_COMMUNITY)
Admission: EM | Admit: 2012-12-06 | Discharge: 2012-12-06 | Disposition: A | Discharge status: Home / Self Care | Payer: Medicare Other | Attending: Emergency Medicine | Admitting: Emergency Medicine

## 2012-12-06 ENCOUNTER — Other Ambulatory Visit: Payer: Self-pay

## 2012-12-06 DIAGNOSIS — I252 Old myocardial infarction: Secondary | ICD-10-CM | POA: Insufficient documentation

## 2012-12-06 DIAGNOSIS — I2510 Atherosclerotic heart disease of native coronary artery without angina pectoris: Secondary | ICD-10-CM | POA: Insufficient documentation

## 2012-12-06 DIAGNOSIS — Z794 Long term (current) use of insulin: Secondary | ICD-10-CM | POA: Insufficient documentation

## 2012-12-06 DIAGNOSIS — Z862 Personal history of diseases of the blood and blood-forming organs and certain disorders involving the immune mechanism: Secondary | ICD-10-CM | POA: Insufficient documentation

## 2012-12-06 DIAGNOSIS — G4733 Obstructive sleep apnea (adult) (pediatric): Secondary | ICD-10-CM | POA: Insufficient documentation

## 2012-12-06 DIAGNOSIS — E119 Type 2 diabetes mellitus without complications: Secondary | ICD-10-CM | POA: Insufficient documentation

## 2012-12-06 DIAGNOSIS — K219 Gastro-esophageal reflux disease without esophagitis: Secondary | ICD-10-CM | POA: Insufficient documentation

## 2012-12-06 DIAGNOSIS — Z87891 Personal history of nicotine dependence: Secondary | ICD-10-CM | POA: Insufficient documentation

## 2012-12-06 DIAGNOSIS — R002 Palpitations: Secondary | ICD-10-CM | POA: Insufficient documentation

## 2012-12-06 DIAGNOSIS — E7800 Pure hypercholesterolemia, unspecified: Secondary | ICD-10-CM | POA: Insufficient documentation

## 2012-12-06 DIAGNOSIS — Z8679 Personal history of other diseases of the circulatory system: Secondary | ICD-10-CM | POA: Insufficient documentation

## 2012-12-06 DIAGNOSIS — Z7982 Long term (current) use of aspirin: Secondary | ICD-10-CM | POA: Insufficient documentation

## 2012-12-06 DIAGNOSIS — I10 Essential (primary) hypertension: Secondary | ICD-10-CM | POA: Insufficient documentation

## 2012-12-06 DIAGNOSIS — Z9861 Coronary angioplasty status: Secondary | ICD-10-CM | POA: Insufficient documentation

## 2012-12-06 DIAGNOSIS — Z8719 Personal history of other diseases of the digestive system: Secondary | ICD-10-CM | POA: Insufficient documentation

## 2012-12-06 LAB — CBC WITH DIFFERENTIAL/PLATELET
Basophils Absolute: 0 10*3/uL (ref 0.0–0.1)
Eosinophils Relative: 2 % (ref 0–5)
Lymphocytes Relative: 19 % (ref 12–46)
MCV: 88.5 fL (ref 78.0–100.0)
Neutrophils Relative %: 73 % (ref 43–77)
Platelets: 134 10*3/uL — ABNORMAL LOW (ref 150–400)
RDW: 15.2 % (ref 11.5–15.5)
WBC: 8 10*3/uL (ref 4.0–10.5)

## 2012-12-06 LAB — COMPREHENSIVE METABOLIC PANEL
ALT: 23 U/L (ref 0–35)
AST: 22 U/L (ref 0–37)
CO2: 23 mEq/L (ref 19–32)
Calcium: 8.9 mg/dL (ref 8.4–10.5)
GFR calc non Af Amer: 61 mL/min — ABNORMAL LOW (ref >90)
Sodium: 141 mEq/L (ref 135–145)
Total Protein: 6.5 g/dL (ref 6.0–8.3)

## 2012-12-06 NOTE — ED Provider Notes (Signed)
History     CSN: [id number]  Arrival date & time 12/06/12  1945   First MD Initiated Contact with Patient 12/06/12 1948      Chief Complaint  Patient presents with  . Tachycardia    (Consider location/radiation/quality/duration/timing/severity/associated sxs/prior treatment) HPI Comments: Patient presents with complaints of palpitations.  She was at home, then developed a rapid heart rate.  She denies there was any discomfort, nausea, diaphoresis, or shortness of breath.  She checked her heart rate and it was 160 and regular.    She has a history of CAD with MI / stents many years ago.  This has been stable since.  Patient is a 72 y.o. female presenting with palpitations. The history is provided by the patient.  Palpitations  This is a new problem. The current episode started 1 to 2 hours ago. The problem occurs constantly. The problem has been resolved. The problem is associated with an unknown factor. On average, each episode lasts 45 minutes. Pertinent negatives include no diaphoresis, no fever, no chest pain, no chest pressure and no shortness of breath. She has tried nothing for the symptoms. Her past medical history is significant for heart disease.    Past Medical History  Diagnosis Date  . Back pain   . GERD (gastroesophageal reflux disease)   . MI (myocardial infarction)   . Hypercholesteremia   . Diabetes mellitus   . Anemia   . Cirrhosis   . Aortic sclerosis 07/04/2011  . Aortic insufficiency 07/04/2011    most recent 2D Echo 02/24/2012  EF greater than 55%  . Carotid stenosis, bilateral 07/04/2011    most recent 05/2010 carotid doppler  . OSA (obstructive sleep apnea) 07/04/2011    sleep study 2009  AHI 9.91/hr and during REM sleep 35.58/hr  . Presence of stent in right coronary artery 07/04/2011  . HTN (hypertension) 07/04/2011  . Coronary artery disease   . Myocardial infarction 1995  . GERD (gastroesophageal reflux disease)     Past Surgical History   Procedure Laterality Date  . Back surgery    . Appendectomy    . Eye surgery      cataract surgery of the left eye  . Cardiac catheterization  1995    stent to the proximal RCA after MI     Family History  Problem Relation Age of Onset  . Diabetes Mother   . Heart failure Father     History  Substance Use Topics  . Smoking status: Former Smoker -- 0.25 packs/day    Types: Cigarettes  . Smokeless tobacco: Not on file  . Alcohol Use: No    OB History   Grav Para Term Preterm Abortions TAB SAB Ect Mult Living                  Review of Systems  Constitutional: Negative for fever and diaphoresis.  Respiratory: Negative for shortness of breath.   Cardiovascular: Positive for palpitations. Negative for chest pain.  All other systems reviewed and are negative.    Allergies  Tape  Home Medications   Current Outpatient Rx  Name  Route  Sig  Dispense  Refill  . aspirin EC 81 MG tablet   Oral   Take 81 mg by mouth daily.           . carvedilol (COREG) 6.25 MG tablet   Oral   Take 6.25 mg by mouth 2 (two) times daily with a meal.           .  insulin glargine (LANTUS SOLOSTAR) 100 UNIT/ML injection   Subcutaneous   Inject 70 Units into the skin at bedtime.          . insulin NPH-insulin regular (NOVOLIN 70/30) (70-30) 100 UNIT/ML injection   Subcutaneous   Inject 70 Units into the skin 2 (two) times daily with a meal.          . isosorbide mononitrate (IMDUR) 30 MG 24 hr tablet   Oral   Take 30 mg by mouth daily.           Marland Kitchen omeprazole (PRILOSEC) 20 MG capsule   Oral   Take 20 mg by mouth daily.           . promethazine (PHENERGAN) 25 MG tablet   Oral   Take 25 mg by mouth every 6 (six) hours as needed for nausea.         . promethazine (PHENERGAN) 25 MG tablet   Oral   Take 1 tablet (25 mg total) by mouth every 6 (six) hours as needed for nausea.   15 tablet   0   . rosuvastatin (CRESTOR) 10 MG tablet   Oral   Take 10 mg by mouth at  bedtime.           . sulfamethoxazole-trimethoprim (BACTRIM DS) 800-160 MG per tablet   Oral   Take 1 tablet by mouth daily.         . valsartan-hydrochlorothiazide (DIOVAN-HCT) 160-12.5 MG per tablet   Oral   Take 1 tablet by mouth daily.         . valsartan-hydrochlorothiazide (DIOVAN-HCT) 160-25 MG per tablet   Oral   Take 1 tablet by mouth daily.         Marland Kitchen zolpidem (AMBIEN) 10 MG tablet   Oral   Take 10 mg by mouth at bedtime as needed for sleep.           BP 139/47  Pulse 103  Temp(Src) 98.4 F (36.9 C) (Oral)  Resp 20  SpO2 96%  Physical Exam  Nursing note and vitals reviewed. Constitutional: She is oriented to person, place, and time. She appears well-developed and well-nourished. No distress.  HENT:  Head: Normocephalic and atraumatic.  Neck: Normal range of motion. Neck supple.  Cardiovascular: Normal rate and regular rhythm.  Exam reveals no gallop and no friction rub.   No murmur heard. Pulmonary/Chest: Effort normal and breath sounds normal. No respiratory distress. She has no wheezes.  Abdominal: Soft. Bowel sounds are normal. She exhibits no distension. There is no tenderness.  Musculoskeletal: Normal range of motion.  Neurological: She is alert and oriented to person, place, and time.  Skin: Skin is warm and dry. She is not diaphoretic.    ED Course  Procedures (including critical care time)  Labs Reviewed  TSH  CBC WITH DIFFERENTIAL  COMPREHENSIVE METABOLIC PANEL  TROPONIN I   No results found.   No diagnosis found.   Date: 12/06/2012  Rate: 103  Rhythm: normal sinus rhythm  QRS Axis: normal  Intervals: normal  ST/T Wave abnormalities: normal  Conduction Disutrbances:none  Narrative Interpretation:   Old EKG Reviewed: unchanged    MDM  The patient presents here with palpitations that resolved pta.  She has never had this before.  When she arrived here, she was in sinus rhythm and has remained there.  The workup shows  normal electrolytes and normal ekg.  The troponin is negative.  I believe she is appropriate for discharge.  If these symptoms recur, she needs to follow up with her pcp to discuss arrangements for a holter monitor.          Veryl Speak, MD 12/06/12 2111

## 2012-12-06 NOTE — ED Notes (Signed)
Pt c/o tachycardia that began earlier today. Pt states she felt her heart racing and palpated a HR of 160+. Upon arrival Pt's HR had decreased to 90-100 and pt reports relief of symptoms. Pt denies chest pain, SOB, NVD.

## 2012-12-06 NOTE — ED Notes (Signed)
Tachycardia, no sob,  Lt neck pain,  No sweats, no nausea.  Alert.

## 2012-12-07 LAB — TSH: TSH: 0.573 u[IU]/mL (ref 0.350–4.500)

## 2013-01-05 ENCOUNTER — Other Ambulatory Visit: Payer: Self-pay | Admitting: Orthopedic Surgery

## 2013-01-10 ENCOUNTER — Other Ambulatory Visit (HOSPITAL_COMMUNITY): Payer: Self-pay | Admitting: Family Medicine

## 2013-01-10 DIAGNOSIS — Z139 Encounter for screening, unspecified: Secondary | ICD-10-CM

## 2013-01-12 ENCOUNTER — Encounter (HOSPITAL_BASED_OUTPATIENT_CLINIC_OR_DEPARTMENT_OTHER): Payer: Self-pay | Admitting: *Deleted

## 2013-01-12 NOTE — Progress Notes (Signed)
Pt here for original ORIF rt wrist 2011-sees dr Debara Pickett cardiology [place]-called for notes-to go to [facility] for bmet 01/17/13

## 2013-01-14 ENCOUNTER — Encounter (HOSPITAL_COMMUNITY)
Admission: RE | Admit: 2013-01-14 | Discharge: 2013-01-14 | Disposition: A | Discharge status: Home / Self Care | Payer: Medicare Other | Source: Ambulatory Visit | Attending: Orthopedic Surgery | Admitting: Orthopedic Surgery

## 2013-01-14 LAB — BASIC METABOLIC PANEL
BUN: 23 mg/dL (ref 6–23)
Chloride: 102 mEq/L (ref 96–112)
GFR calc Af Amer: 65 mL/min — ABNORMAL LOW (ref >90)
GFR calc non Af Amer: 56 mL/min — ABNORMAL LOW (ref >90)
Glucose, Bld: 264 mg/dL — ABNORMAL HIGH (ref 70–99)
Potassium: 4.1 mEq/L (ref 3.5–5.1)
Sodium: 138 mEq/L (ref 135–145)

## 2013-01-17 ENCOUNTER — Encounter (HOSPITAL_BASED_OUTPATIENT_CLINIC_OR_DEPARTMENT_OTHER): Payer: Self-pay | Admitting: Anesthesiology

## 2013-01-17 ENCOUNTER — Inpatient Hospital Stay (HOSPITAL_COMMUNITY): Admission: RE | Admit: 2013-01-17 | Payer: Medicare Other | Source: Ambulatory Visit

## 2013-01-17 ENCOUNTER — Ambulatory Visit (HOSPITAL_BASED_OUTPATIENT_CLINIC_OR_DEPARTMENT_OTHER)
Admission: RE | Admit: 2013-01-17 | Discharge: 2013-01-17 | Disposition: A | Discharge status: Home / Self Care | Payer: Medicare Other | Source: Ambulatory Visit | Attending: Orthopedic Surgery | Admitting: Orthopedic Surgery

## 2013-01-17 ENCOUNTER — Encounter (HOSPITAL_BASED_OUTPATIENT_CLINIC_OR_DEPARTMENT_OTHER)
Admission: RE | Disposition: A | Discharge status: Home / Self Care | Payer: Self-pay | Source: Ambulatory Visit | Attending: Orthopedic Surgery

## 2013-01-17 ENCOUNTER — Ambulatory Visit (HOSPITAL_BASED_OUTPATIENT_CLINIC_OR_DEPARTMENT_OTHER): Payer: Medicare Other | Admitting: Anesthesiology

## 2013-01-17 DIAGNOSIS — T8489XA Other specified complication of internal orthopedic prosthetic devices, implants and grafts, initial encounter: Secondary | ICD-10-CM | POA: Insufficient documentation

## 2013-01-17 DIAGNOSIS — D649 Anemia, unspecified: Secondary | ICD-10-CM | POA: Insufficient documentation

## 2013-01-17 DIAGNOSIS — K219 Gastro-esophageal reflux disease without esophagitis: Secondary | ICD-10-CM | POA: Insufficient documentation

## 2013-01-17 DIAGNOSIS — I2510 Atherosclerotic heart disease of native coronary artery without angina pectoris: Secondary | ICD-10-CM | POA: Insufficient documentation

## 2013-01-17 DIAGNOSIS — Z79899 Other long term (current) drug therapy: Secondary | ICD-10-CM | POA: Insufficient documentation

## 2013-01-17 DIAGNOSIS — I6529 Occlusion and stenosis of unspecified carotid artery: Secondary | ICD-10-CM | POA: Insufficient documentation

## 2013-01-17 DIAGNOSIS — I658 Occlusion and stenosis of other precerebral arteries: Secondary | ICD-10-CM | POA: Insufficient documentation

## 2013-01-17 DIAGNOSIS — M259 Joint disorder, unspecified: Secondary | ICD-10-CM | POA: Insufficient documentation

## 2013-01-17 DIAGNOSIS — Z7982 Long term (current) use of aspirin: Secondary | ICD-10-CM | POA: Insufficient documentation

## 2013-01-17 DIAGNOSIS — Z87891 Personal history of nicotine dependence: Secondary | ICD-10-CM | POA: Insufficient documentation

## 2013-01-17 DIAGNOSIS — Z794 Long term (current) use of insulin: Secondary | ICD-10-CM | POA: Insufficient documentation

## 2013-01-17 DIAGNOSIS — E119 Type 2 diabetes mellitus without complications: Secondary | ICD-10-CM | POA: Insufficient documentation

## 2013-01-17 DIAGNOSIS — G4733 Obstructive sleep apnea (adult) (pediatric): Secondary | ICD-10-CM | POA: Insufficient documentation

## 2013-01-17 DIAGNOSIS — Z9109 Other allergy status, other than to drugs and biological substances: Secondary | ICD-10-CM | POA: Insufficient documentation

## 2013-01-17 DIAGNOSIS — Y831 Surgical operation with implant of artificial internal device as the cause of abnormal reaction of the patient, or of later complication, without mention of misadventure at the time of the procedure: Secondary | ICD-10-CM | POA: Insufficient documentation

## 2013-01-17 DIAGNOSIS — K7460 Unspecified cirrhosis of liver: Secondary | ICD-10-CM | POA: Insufficient documentation

## 2013-01-17 DIAGNOSIS — E7800 Pure hypercholesterolemia, unspecified: Secondary | ICD-10-CM | POA: Insufficient documentation

## 2013-01-17 DIAGNOSIS — M25539 Pain in unspecified wrist: Secondary | ICD-10-CM | POA: Insufficient documentation

## 2013-01-17 DIAGNOSIS — I359 Nonrheumatic aortic valve disorder, unspecified: Secondary | ICD-10-CM | POA: Insufficient documentation

## 2013-01-17 DIAGNOSIS — I252 Old myocardial infarction: Secondary | ICD-10-CM | POA: Insufficient documentation

## 2013-01-17 HISTORY — PX: HARDWARE REMOVAL: SHX979

## 2013-01-17 LAB — POCT HEMOGLOBIN-HEMACUE: Hemoglobin: 11.4 g/dL — ABNORMAL LOW (ref 12.0–15.0)

## 2013-01-17 LAB — GLUCOSE, CAPILLARY
Glucose-Capillary: 141 mg/dL — ABNORMAL HIGH (ref 70–99)
Glucose-Capillary: 155 mg/dL — ABNORMAL HIGH (ref 70–99)

## 2013-01-17 SURGERY — REMOVAL, HARDWARE
Anesthesia: Regional | Site: Wrist | Laterality: Right | Wound class: Clean

## 2013-01-17 MED ORDER — METOCLOPRAMIDE HCL 5 MG/ML IJ SOLN: 10.0000 mg | Freq: Once | INTRAMUSCULAR | Status: DC | PRN

## 2013-01-17 MED ORDER — FENTANYL CITRATE 0.05 MG/ML IJ SOLN: 25.0000 ug | INTRAMUSCULAR | Status: DC | PRN

## 2013-01-17 MED ORDER — MIDAZOLAM HCL 2 MG/2ML IJ SOLN
1.0000 mg | INTRAMUSCULAR | Status: DC | PRN
  Administered 2013-01-17: 2 mg via INTRAVENOUS

## 2013-01-17 MED ORDER — PROPOFOL 10 MG/ML IV BOLUS
INTRAVENOUS | Status: DC | PRN
  Administered 2013-01-17: 150 mg via INTRAVENOUS

## 2013-01-17 MED ORDER — ROPIVACAINE HCL 5 MG/ML IJ SOLN
INTRAMUSCULAR | Status: DC | PRN
  Administered 2013-01-17: 25 mL

## 2013-01-17 MED ORDER — LIDOCAINE HCL 1 % IJ SOLN
INTRAMUSCULAR | Status: DC | PRN
  Administered 2013-01-17: 2 mL via INTRADERMAL

## 2013-01-17 MED ORDER — OXYCODONE HCL 5 MG PO TABS
5.0000 mg | ORAL_TABLET | Freq: Once | ORAL | Status: DC | PRN
Start: 2013-01-17 — End: 2013-01-17

## 2013-01-17 MED ORDER — LACTATED RINGERS IV SOLN
INTRAVENOUS | Status: DC
  Administered 2013-01-17 (×2): via INTRAVENOUS

## 2013-01-17 MED ORDER — FENTANYL CITRATE 0.05 MG/ML IJ SOLN
50.0000 ug | INTRAMUSCULAR | Status: DC | PRN
  Administered 2013-01-17: 50 ug via INTRAVENOUS

## 2013-01-17 MED ORDER — EPHEDRINE SULFATE 50 MG/ML IJ SOLN
INTRAMUSCULAR | Status: DC | PRN
  Administered 2013-01-17 (×2): 10 mg via INTRAVENOUS

## 2013-01-17 MED ORDER — DEXAMETHASONE SODIUM PHOSPHATE 10 MG/ML IJ SOLN
INTRAMUSCULAR | Status: DC | PRN
  Administered 2013-01-17: 4 mg via INTRAVENOUS

## 2013-01-17 MED ORDER — ONDANSETRON HCL 4 MG/2ML IJ SOLN
INTRAMUSCULAR | Status: DC | PRN
  Administered 2013-01-17: 4 mg via INTRAVENOUS

## 2013-01-17 MED ORDER — LIDOCAINE HCL (CARDIAC) 20 MG/ML IV SOLN
INTRAVENOUS | Status: DC | PRN
  Administered 2013-01-17: 30 mg via INTRAVENOUS

## 2013-01-17 MED ORDER — 0.9 % SODIUM CHLORIDE (POUR BTL) OPTIME
TOPICAL | Status: DC | PRN
  Administered 2013-01-17: 100 mL

## 2013-01-17 MED ORDER — OXYCODONE HCL 5 MG/5ML PO SOLN: 5.0000 mg | Freq: Once | ORAL | Status: DC | PRN

## 2013-01-17 MED ORDER — CEFAZOLIN SODIUM-DEXTROSE 2-3 GM-% IV SOLR
2.0000 g | INTRAVENOUS | Status: AC
  Administered 2013-01-17: 2 g via INTRAVENOUS

## 2013-01-17 MED ORDER — CHLORHEXIDINE GLUCONATE 4 % EX LIQD: 60.0000 mL | Freq: Once | CUTANEOUS | Status: DC

## 2013-01-17 MED ORDER — HYDROCODONE-ACETAMINOPHEN 5-325 MG PO TABS: ORAL_TABLET | ORAL | Status: DC

## 2013-01-17 SURGICAL SUPPLY — 62 items
APL SKNCLS STERI-STRIP NONHPOA (GAUZE/BANDAGES/DRESSINGS)
BANDAGE COBAN STERILE 2 (GAUZE/BANDAGES/DRESSINGS) IMPLANT
BANDAGE CONFORM 2  STR LF (GAUZE/BANDAGES/DRESSINGS) IMPLANT
BANDAGE ELASTIC 3 VELCRO ST LF (GAUZE/BANDAGES/DRESSINGS) ×2 IMPLANT
BANDAGE GAUZE ELAST BULKY 4 IN (GAUZE/BANDAGES/DRESSINGS) ×2 IMPLANT
BANDAGE GAUZE STRT 1 STR LF (GAUZE/BANDAGES/DRESSINGS) IMPLANT
BENZOIN TINCTURE PRP APPL 2/3 (GAUZE/BANDAGES/DRESSINGS) IMPLANT
BLADE MINI RND TIP GREEN BEAV (BLADE) IMPLANT
BLADE SURG 15 STRL LF DISP TIS (BLADE) ×2 IMPLANT
BLADE SURG 15 STRL SS (BLADE) ×4
BNDG CMPR 9X4 STRL LF SNTH (GAUZE/BANDAGES/DRESSINGS) ×1
BNDG CMPR MD 5X2 ELC HKLP STRL (GAUZE/BANDAGES/DRESSINGS)
BNDG COHESIVE 1X5 TAN STRL LF (GAUZE/BANDAGES/DRESSINGS) IMPLANT
BNDG ELASTIC 2 VLCR STRL LF (GAUZE/BANDAGES/DRESSINGS) IMPLANT
BNDG ESMARK 4X9 LF (GAUZE/BANDAGES/DRESSINGS) ×1 IMPLANT
BNDG PLASTER X FAST 3X3 WHT LF (CAST SUPPLIES) IMPLANT
BNDG PLSTR 9X3 FST ST WHT (CAST SUPPLIES)
CHLORAPREP W/TINT 26ML (MISCELLANEOUS) ×2 IMPLANT
CLOTH BEACON ORANGE TIMEOUT ST (SAFETY) ×2 IMPLANT
CORDS BIPOLAR (ELECTRODE) ×2 IMPLANT
COVER MAYO STAND STRL (DRAPES) ×2 IMPLANT
COVER TABLE BACK 60X90 (DRAPES) ×2 IMPLANT
CUFF TOURNIQUET SINGLE 18IN (TOURNIQUET CUFF) ×2 IMPLANT
DRAPE EXTREMITY T 121X128X90 (DRAPE) ×2 IMPLANT
DRAPE OEC MINIVIEW 54X84 (DRAPES) ×1 IMPLANT
DRAPE SURG 17X23 STRL (DRAPES) ×2 IMPLANT
DRSG PAD ABDOMINAL 8X10 ST (GAUZE/BANDAGES/DRESSINGS) IMPLANT
GAUZE XEROFORM 1X8 LF (GAUZE/BANDAGES/DRESSINGS) ×2 IMPLANT
GLOVE BIO SURGEON STRL SZ7.5 (GLOVE) ×2 IMPLANT
GLOVE BIOGEL PI IND STRL 8 (GLOVE) ×1 IMPLANT
GLOVE BIOGEL PI INDICATOR 8 (GLOVE) ×1
GLOVE ECLIPSE 6.5 STRL STRAW (GLOVE) ×1 IMPLANT
GLOVE INDICATOR 6.5 STRL GRN (GLOVE) ×1 IMPLANT
GLOVE INDICATOR 8.0 STRL GRN (GLOVE) ×2 IMPLANT
GOWN BRE IMP PREV XXLGXLNG (GOWN DISPOSABLE) ×2 IMPLANT
GOWN PREVENTION PLUS XLARGE (GOWN DISPOSABLE) ×2 IMPLANT
NDL HYPO 25X1 1.5 SAFETY (NEEDLE) ×1 IMPLANT
NEEDLE HYPO 22GX1.5 SAFETY (NEEDLE) ×1 IMPLANT
NEEDLE HYPO 25X1 1.5 SAFETY (NEEDLE) ×2 IMPLANT
NS IRRIG 1000ML POUR BTL (IV SOLUTION) ×2 IMPLANT
PACK BASIN DAY SURGERY FS (CUSTOM PROCEDURE TRAY) ×2 IMPLANT
PAD CAST 3X4 CTTN HI CHSV (CAST SUPPLIES) IMPLANT
PAD CAST 4YDX4 CTTN HI CHSV (CAST SUPPLIES) IMPLANT
PADDING CAST ABS 4INX4YD NS (CAST SUPPLIES) ×1
PADDING CAST ABS COTTON 4X4 ST (CAST SUPPLIES) ×1 IMPLANT
PADDING CAST COTTON 3X4 STRL (CAST SUPPLIES)
PADDING CAST COTTON 4X4 STRL (CAST SUPPLIES)
SLEEVE SCD COMPRESS KNEE MED (MISCELLANEOUS) ×1 IMPLANT
SPLINT PLASTER CAST XFAST 3X15 (CAST SUPPLIES) IMPLANT
SPLINT PLASTER XTRA FASTSET 3X (CAST SUPPLIES)
SPONGE GAUZE 4X4 12PLY (GAUZE/BANDAGES/DRESSINGS) ×2 IMPLANT
STOCKINETTE 4X48 STRL (DRAPES) ×2 IMPLANT
STRIP CLOSURE SKIN 1/2X4 (GAUZE/BANDAGES/DRESSINGS) IMPLANT
STRIP CLOSURE SKIN 1/4X4 (GAUZE/BANDAGES/DRESSINGS) IMPLANT
SUT ETHILON 3 0 PS 1 (SUTURE) IMPLANT
SUT ETHILON 4 0 PS 2 18 (SUTURE) ×2 IMPLANT
SUT MNCRL AB 4-0 PS2 18 (SUTURE) IMPLANT
SUT VIC AB 3-0 FS2 27 (SUTURE) ×1 IMPLANT
SYR BULB 3OZ (MISCELLANEOUS) ×2 IMPLANT
SYR CONTROL 10ML LL (SYRINGE) ×2 IMPLANT
TOWEL OR 17X24 6PK STRL BLUE (TOWEL DISPOSABLE) ×4 IMPLANT
UNDERPAD 30X30 INCONTINENT (UNDERPADS AND DIAPERS) ×2 IMPLANT

## 2013-01-17 NOTE — Brief Op Note (Signed)
01/17/2013  10:58 AM  PATIENT:  [name]  72 y.o. female  PRE-OPERATIVE DIAGNOSIS:  PAINFUL ULNAR HARDWARE STYPLOID IMPINGEMENT RIGHT WRIST  POST-OPERATIVE DIAGNOSIS:  Styloid Impingement Right Wrist  PROCEDURE:  Procedure(s): REMOVAL OF HARDWARE AND EXCISION ULNAR STYLOID RIGHT WRIST (Right) EXCISION ULNAR STYLOID RIGHT WRIST (Right)  SURGEON:  Surgeon(s) and Role:    * Tennis Must, MD - Primary  PHYSICIAN ASSISTANT:   ASSISTANTS: none   ANESTHESIA:   regional and general  EBL:  Total I/O In: 1500 [I.V.:1500] Out: -   BLOOD ADMINISTERED:none  DRAINS: none   LOCAL MEDICATIONS USED:  NONE  SPECIMEN:  No Specimen  DISPOSITION OF SPECIMEN:  N/A  COUNTS:  YES  TOURNIQUET:   Total Tourniquet Time Documented: Upper Arm (Right) - 72 minutes Total: Upper Arm (Right) - 72 minutes   DICTATION: .Other Dictation: Dictation Number [id number]  PLAN OF CARE: Discharge to home after PACU  PATIENT DISPOSITION:  PACU - hemodynamically stable.

## 2013-01-17 NOTE — Anesthesia Postprocedure Evaluation (Signed)
Anesthesia Post Note  Patient: [name]  Procedure(s) Performed: Procedure(s) (LRB): REMOVAL OF HARDWARE AND EXCISION ULNAR STYLOID RIGHT WRIST (Right) EXCISION ULNAR STYLOID RIGHT WRIST (Right)  Anesthesia type: General  Patient location: PACU  Post pain: Pain level controlled  Post assessment: Patient's Cardiovascular Status Stable  Last Vitals:  Filed Vitals:   01/17/13 1130  BP:   Pulse: 75  Temp:   Resp: 15    Post vital signs: Reviewed and stable  Level of consciousness: alert  Complications: No apparent anesthesia complications

## 2013-01-17 NOTE — Progress Notes (Signed)
Assisted Dr. Albertina Parr with right, ultrasound guided, interscalene  block. Side rails up, monitors on throughout procedure. See vital signs in flow sheet. Tolerated Procedure well.

## 2013-01-17 NOTE — Anesthesia Procedure Notes (Addendum)
Anesthesia Regional Block:  Supraclavicular block  Pre-Anesthetic Checklist: ,, timeout performed, Correct Patient, Correct Site, Correct Laterality, Correct Procedure, Correct Position, site marked, Risks and benefits discussed,  Surgical consent,  Pre-op evaluation,  At surgeon's request and post-op pain management  Laterality: Right  Prep: chloraprep       Needles:   Needle Type: Other     Needle Length: 9cm  Needle Gauge: 21    Additional Needles:  Procedures: ultrasound guided (picture in chart) Supraclavicular block Narrative:  Start time: 01/17/2013 8:49 AM End time: 01/17/2013 8:55 AM Injection made incrementally with aspirations every 5 mL.  Performed by: Personally  Anesthesiologist: C Frederick  Additional Notes: Ultrasound guidance used to: id relevant anatomy, confirm needle position, local anesthetic spread, avoidance of vascular puncture. Picture saved. No complications. Block performed personally by Jessy Oto. Albertina Parr, MD    Supraclavicular block Procedure Name: LMA Insertion Date/Time: 01/17/2013 9:32 AM Performed by: Shanik Brookshire D Pre-anesthesia Checklist: Patient identified, Emergency Drugs available, Suction available and Patient being monitored Patient Re-evaluated:Patient Re-evaluated prior to inductionOxygen Delivery Method: Circle System Utilized Preoxygenation: Pre-oxygenation with 100% oxygen Intubation Type: IV induction Ventilation: Mask ventilation without difficulty LMA: LMA inserted LMA Size: 4.0 Number of attempts: 1 Airway Equipment and Method: bite block Placement Confirmation: positive ETCO2 Tube secured with: Tape Dental Injury: Teeth and Oropharynx as per pre-operative assessment

## 2013-01-17 NOTE — H&P (Signed)
[name] is an 72 y.o. female.   Chief Complaint: right wrist painful hardware and ulnar styloid impingement HPI: 72 yo female 3.5 years s/p ORIF right distal radius and ulna fractures.  Has healed fractures.  Now with pain from ulnar plate and ulnar styloid impingement.  Ulnar styloid with overgrowth after last surgery.  She wishes to have the plate removed and an ulnar styloidectomy for management of pain.  Past Medical History  Diagnosis Date  . Back pain   . GERD (gastroesophageal reflux disease)   . MI (myocardial infarction)   . Hypercholesteremia   . Diabetes mellitus   . Anemia   . Cirrhosis   . Aortic sclerosis 07/04/2011  . Aortic insufficiency 07/04/2011    most recent 2D Echo 02/24/2012  EF greater than 55%  . Carotid stenosis, bilateral 07/04/2011    most recent 05/2010 carotid doppler  . OSA (obstructive sleep apnea) 07/04/2011    sleep study 2009  AHI 9.91/hr and during REM sleep 35.58/hr  . Presence of stent in right coronary artery 07/04/2011  . HTN (hypertension) 07/04/2011  . Coronary artery disease   . Myocardial infarction 1995  . GERD (gastroesophageal reflux disease)     Past Surgical History  Procedure Laterality Date  . Back surgery  1985  . Tonsillectomy    . Appendectomy    . Dilation and curettage of uterus    . Colonoscopy    . Eye surgery  08    cataract surgery of the left eye  . Ercp    . Abdominal hysterectomy  1972  . Cardiac catheterization  1995.,2006,1997    stent to the proximal RCA after MI   . Myringotomy  2012    both ears    Family History  Problem Relation Age of Onset  . Diabetes Mother   . Heart failure Father    Social History:  reports that she quit smoking about a year ago. Her smoking use included Cigarettes. She smoked 0.25 packs per day. She does not have any smokeless tobacco history on file. She reports that she does not drink alcohol or use illicit drugs.  Allergies:  Allergies  Allergen Reactions  .  Tape Rash    ADHESIVE TAPE    Medications Prior to Admission  Medication Sig Dispense Refill  . aspirin EC 81 MG tablet Take 81 mg by mouth daily.        . carvedilol (COREG) 6.25 MG tablet Take 6.25 mg by mouth 2 (two) times daily with a meal.       . insulin glargine (LANTUS SOLOSTAR) 100 UNIT/ML injection Inject 70 Units into the skin at bedtime.       . insulin NPH-insulin regular (NOVOLIN 70/30) (70-30) 100 UNIT/ML injection Inject 70 Units into the skin 2 (two) times daily with a meal.       . isosorbide mononitrate (IMDUR) 30 MG 24 hr tablet Take 30 mg by mouth daily.       Marland Kitchen omeprazole (PRILOSEC) 20 MG capsule Take 20 mg by mouth daily.       . promethazine (PHENERGAN) 25 MG tablet Take 25 mg by mouth every 6 (six) hours as needed for nausea.      . rosuvastatin (CRESTOR) 10 MG tablet Take 10 mg by mouth at bedtime.        . sulfamethoxazole-trimethoprim (BACTRIM DS) 800-160 MG per tablet Take 1 tablet by mouth daily.      . valsartan-hydrochlorothiazide (DIOVAN-HCT) 160-12.5 MG per  tablet Take 1 tablet by mouth daily.      Marland Kitchen zolpidem (AMBIEN) 10 MG tablet Take 10 mg by mouth at bedtime as needed for sleep.        No results found for this or any previous visit (from the past 48 hour(s)).  No results found.   A comprehensive review of systems was negative except for: Eyes: positive for contacts/glasses  Blood pressure 141/57, pulse 67, temperature 98.3 F (36.8 C), temperature source Oral, resp. rate 20, height 5' 3"  (1.6 m), weight 95.709 kg (211 lb), SpO2 97.00%.  General appearance: alert, cooperative and appears stated age Head: Normocephalic, without obvious abnormality, atraumatic Neck: supple, symmetrical, trachea midline Resp: clear to auscultation bilaterally Cardio: regular rate and rhythm GI: non tender Extremities: intact sensation and capillary refill all digits.  +epl/fpl/io.  ttp ulnar styloid.  skin intact. Pulses: 2+ and symmetric Skin: Skin color,  texture, turgor normal. No rashes or lesions Neurologic: Grossly normal Incision/Wound: Well healed  Assessment/Plan Painful ulnar hardware and ulnar styloid impingement.  Non operative and operative treatment options were discussed with the patient and patient wishes to proceed with operative treatment. Risks, benefits, and alternatives of surgery were discussed and the patient agrees with the plan of care.   [name] R 01/17/2013, 8:29 AM

## 2013-01-17 NOTE — Anesthesia Preprocedure Evaluation (Signed)
Anesthesia Evaluation  Patient identified by MRN, date of birth, ID band Patient awake    Reviewed: Allergy & Precautions, H&P , NPO status , Patient's Chart, lab work & pertinent test results, reviewed documented beta blocker date and time   Airway Mallampati: II TM Distance: >3 FB Neck ROM: full    Dental   Pulmonary sleep apnea ,  breath sounds clear to auscultation        Cardiovascular hypertension, On Medications + CAD, + Past MI, + Cardiac Stents and + Peripheral Vascular Disease negative cardio ROS  + Valvular Problems/Murmurs AI Rhythm:regular     Neuro/Psych negative neurological ROS  negative psych ROS   GI/Hepatic Neg liver ROS, GERD-  Medicated and Controlled,  Endo/Other  diabetes, Insulin DependentMorbid obesity  Renal/GU negative Renal ROS  negative genitourinary   Musculoskeletal   Abdominal   Peds  Hematology  (+) anemia ,   Anesthesia Other Findings See surgeon's H&P   Reproductive/Obstetrics negative OB ROS                           Anesthesia Physical Anesthesia Plan  ASA: III  Anesthesia Plan: General   Post-op Pain Management:    Induction: Intravenous  Airway Management Planned: LMA  Additional Equipment:   Intra-op Plan:   Post-operative Plan:   Informed Consent: I have reviewed the patients History and Physical, chart, labs and discussed the procedure including the risks, benefits and alternatives for the proposed anesthesia with the patient or authorized representative who has indicated his/her understanding and acceptance.   Dental Advisory Given  Plan Discussed with: CRNA and Surgeon  Anesthesia Plan Comments:         Anesthesia Quick Evaluation

## 2013-01-17 NOTE — Transfer of Care (Signed)
Immediate Anesthesia Transfer of Care Note  Patient: [name]  Procedure(s) Performed: Procedure(s): REMOVAL OF HARDWARE AND EXCISION ULNAR STYLOID RIGHT WRIST (Right) EXCISION ULNAR STYLOID RIGHT WRIST (Right)  Patient Location: PACU  Anesthesia Type:General and GA combined with regional for post-op pain  Level of Consciousness: sedated  Airway & Oxygen Therapy: Patient Spontanous Breathing and Patient connected to face mask oxygen  Post-op Assessment: Report given to PACU RN and Post -op Vital signs reviewed and stable  Post vital signs: Reviewed and stable  Complications: No apparent anesthesia complications

## 2013-01-17 NOTE — Op Note (Signed)
[id number] 

## 2013-01-18 ENCOUNTER — Encounter (HOSPITAL_BASED_OUTPATIENT_CLINIC_OR_DEPARTMENT_OTHER): Payer: Self-pay | Admitting: Orthopedic Surgery

## 2013-01-18 NOTE — Op Note (Signed)
NAMEYAKIMA, KREITZER NO.:  [id number]  MEDICAL RECORD NO.:  [id number]  LOCATION:                                 FACILITY:  PHYSICIAN:  Leanora Cover, MD        DATE OF BIRTH:  [birth year]  DATE OF PROCEDURE:  01/17/2013 DATE OF DISCHARGE:                              OPERATIVE REPORT   PREOPERATIVE DIAGNOSIS:  Right wrist retained hardware and ulnar styloid impingement.  POSTOP DIAGNOSIS:  Right wrist retained hardware and ulnar styloid impingement.  PROCEDURE:  Right ulnar styloidectomy and removal of painful hardware.  SURGEON:  Leanora Cover, MD  ASSISTANT:  None.  ANESTHESIA:  General with regional.  IV FLUIDS:  Per anesthesia flow sheet.  ESTIMATED BLOOD LOSS:  Minimal.  COMPLICATIONS:  None.  SPECIMENS:  None.  TOURNIQUET TIME:  72 minutes.  DISPOSITION:  Stable to PACU.  INDICATIONS:  Ms. Cimino is a 72 year old female, who 3-1/2 years ago suffered a distal radius and ulna fracture.  She underwent open reduction and internal fixation.  She did well and healed the fractures. She has since had pain in the wrist.  She notes this at the ulnar side of the wrist.  She has been followed in the office.  We felt that the source of her pain was painful hardware and an elongated ulnar styloid causing impingement.  We discussed removal of the hardware and ulnar styloidectomy.  Risks, benefits, and alternatives of the surgery were discussed including the risk of blood loss, infection, damage to nerves, vessels, tendons, ligaments, bone; failure of surgery; need for additional surgery; complications with wound healing; continued pain and recurrence of pain.  She voiced understanding of these risks and elected to proceed.  OPERATIVE COURSE:  After being identified preoperatively by myself, the patient and I agreed upon the procedure and site of procedure.  Surgical site was marked.  The risks, benefits, and alternatives of surgery were reviewed and she  wished to proceed.  Surgical consent had been signed. She was given 2 g of IV Ancef as preoperative antibiotic prophylaxis.  A regional block was performed by anesthesia in preoperative holding.  She was transferred to the operating room and placed on the operating table in supine position with the right upper extremity on armboard.  General anesthesia was induced by the anesthesiologist.  The right upper extremity was prepped and draped in normal sterile orthopedic fashion. A surgical pause was performed between the surgeons, anesthesia, operating staff, and all were in agreement as to the patient, procedure, and site of procedure.  Tourniquet at the proximal aspect of the extremity was inflated to 250 mmHg after exsanguination of the limb with an Esmarch bandage.  The previous incision on the ulnar side of the wrist was followed.  It was carried into subcutaneous tissues by spreading technique.  The dorsal branch of the ulnar nerve was identified and protected throughout the case.  The plate was identified. It was freed of soft tissue coverage.  The ulnar styloid was then identified.  It was freed of its soft tissue attachments at the ulnar side.  A 22-gauge needle was placed into the ulnar styloid for localization.  The C-arm was used in AP and lateral projections to ensure appropriate positioning which was the case.  The ulnar styloidectomy was performed using combination of rongeurs and osteotome. The C-arm was again used in AP and lateral projections to ensure appropriate level of the ulnar styloidectomy which was the case.  There was no evidence of impingement anymore.  The plate was then removed using a screwdriver.  All screws came out easily.  The plate was removed in its entirety.  There was not significant bone growth up around the plate.  Any that was present was smoothed using the rongeurs.  The wounds were copiously irrigated with sterile saline.  The capsule was closed  with 3-0 Vicryl suture.  The 3-0 Vicryl was used in an interrupted fashion in the subcutaneous tissues, and the skin was closed with 4-0 nylon in a horizontal mattress fashion.  The wound was dressed with sterile Xeroform, 4x4s, and wrapped with Kerlix.  A volar splint was placed and wrapped with Kerlix and Ace bandage.  Tourniquet was deflated at 72 minutes.  The operative drapes were broken down.  The patient was awoken from anesthesia safely.  She was transferred back to the stretcher and taken to PACU in stable condition.  I will see her back in the office in 1 week for postoperative followup.  I will give her Norco 5/325 one to two p.o. q.6 h. p.r.n. pain, dispensed #40.     Leanora Cover, MD     KK/MEDQ  D:  01/17/2013  T:  01/18/2013  Job:  [id number]

## 2013-01-24 ENCOUNTER — Other Ambulatory Visit: Payer: Self-pay | Admitting: *Deleted

## 2013-01-24 MED ORDER — ROSUVASTATIN CALCIUM 10 MG PO TABS: 10.0000 mg | ORAL_TABLET | Freq: Every day | ORAL | Status: DC

## 2013-02-08 ENCOUNTER — Other Ambulatory Visit: Payer: Self-pay | Admitting: *Deleted

## 2013-02-08 MED ORDER — ROSUVASTATIN CALCIUM 10 MG PO TABS: 10.0000 mg | ORAL_TABLET | Freq: Every day | ORAL | Status: DC

## 2013-02-15 ENCOUNTER — Ambulatory Visit (HOSPITAL_COMMUNITY): Payer: Medicare Other

## 2013-02-16 ENCOUNTER — Ambulatory Visit (HOSPITAL_COMMUNITY)
Admission: RE | Admit: 2013-02-16 | Discharge: 2013-02-16 | Disposition: A | Discharge status: Home / Self Care | Payer: Medicare Other | Source: Ambulatory Visit | Attending: Family Medicine | Admitting: Family Medicine

## 2013-02-16 DIAGNOSIS — Z1231 Encounter for screening mammogram for malignant neoplasm of breast: Secondary | ICD-10-CM | POA: Insufficient documentation

## 2013-02-16 DIAGNOSIS — Z139 Encounter for screening, unspecified: Secondary | ICD-10-CM

## 2013-03-22 ENCOUNTER — Other Ambulatory Visit: Payer: Self-pay | Admitting: *Deleted

## 2013-03-22 MED ORDER — CARVEDILOL 6.25 MG PO TABS: 6.2500 mg | ORAL_TABLET | Freq: Two times a day (BID) | ORAL | Status: DC

## 2013-03-24 DIAGNOSIS — Z9861 Coronary angioplasty status: Secondary | ICD-10-CM | POA: Insufficient documentation

## 2013-03-24 DIAGNOSIS — R110 Nausea: Secondary | ICD-10-CM | POA: Insufficient documentation

## 2013-03-24 DIAGNOSIS — Z7982 Long term (current) use of aspirin: Secondary | ICD-10-CM | POA: Insufficient documentation

## 2013-03-24 DIAGNOSIS — Z87891 Personal history of nicotine dependence: Secondary | ICD-10-CM | POA: Insufficient documentation

## 2013-03-24 DIAGNOSIS — I252 Old myocardial infarction: Secondary | ICD-10-CM | POA: Insufficient documentation

## 2013-03-24 DIAGNOSIS — Z79899 Other long term (current) drug therapy: Secondary | ICD-10-CM | POA: Insufficient documentation

## 2013-03-24 DIAGNOSIS — Z8719 Personal history of other diseases of the digestive system: Secondary | ICD-10-CM | POA: Insufficient documentation

## 2013-03-24 DIAGNOSIS — I10 Essential (primary) hypertension: Secondary | ICD-10-CM | POA: Insufficient documentation

## 2013-03-24 DIAGNOSIS — E7800 Pure hypercholesterolemia, unspecified: Secondary | ICD-10-CM | POA: Insufficient documentation

## 2013-03-24 DIAGNOSIS — K219 Gastro-esophageal reflux disease without esophagitis: Secondary | ICD-10-CM | POA: Insufficient documentation

## 2013-03-24 DIAGNOSIS — Z8669 Personal history of other diseases of the nervous system and sense organs: Secondary | ICD-10-CM | POA: Insufficient documentation

## 2013-03-24 DIAGNOSIS — Z794 Long term (current) use of insulin: Secondary | ICD-10-CM | POA: Insufficient documentation

## 2013-03-24 DIAGNOSIS — Z862 Personal history of diseases of the blood and blood-forming organs and certain disorders involving the immune mechanism: Secondary | ICD-10-CM | POA: Insufficient documentation

## 2013-03-24 DIAGNOSIS — K920 Hematemesis: Secondary | ICD-10-CM | POA: Insufficient documentation

## 2013-03-24 DIAGNOSIS — I2510 Atherosclerotic heart disease of native coronary artery without angina pectoris: Secondary | ICD-10-CM | POA: Insufficient documentation

## 2013-03-24 DIAGNOSIS — Z8679 Personal history of other diseases of the circulatory system: Secondary | ICD-10-CM | POA: Insufficient documentation

## 2013-03-24 DIAGNOSIS — E119 Type 2 diabetes mellitus without complications: Secondary | ICD-10-CM | POA: Insufficient documentation

## 2013-03-24 NOTE — ED Notes (Signed)
Was reading, felt nauseated, went to the bathroom and vomited per pt. Did have some raspberries and strawberries for supper around 2000 tonight per pt.

## 2013-03-25 ENCOUNTER — Emergency Department (HOSPITAL_COMMUNITY)
Admission: EM | Admit: 2013-03-25 | Discharge: 2013-03-25 | Disposition: A | Discharge status: Home / Self Care | Payer: Medicare Other | Attending: Emergency Medicine | Admitting: Emergency Medicine

## 2013-03-25 ENCOUNTER — Encounter (HOSPITAL_COMMUNITY): Payer: Self-pay

## 2013-03-25 ENCOUNTER — Emergency Department (HOSPITAL_COMMUNITY): Payer: Medicare Other

## 2013-03-25 DIAGNOSIS — K920 Hematemesis: Secondary | ICD-10-CM

## 2013-03-25 LAB — CBC
HCT: 32.8 % — ABNORMAL LOW (ref 36.0–46.0)
Hemoglobin: 10.8 g/dL — ABNORMAL LOW (ref 12.0–15.0)
MCHC: 32.9 g/dL (ref 30.0–36.0)
MCV: 88.9 fL (ref 78.0–100.0)

## 2013-03-25 LAB — POCT I-STAT, CHEM 8
BUN: 29 mg/dL — ABNORMAL HIGH (ref 6–23)
Calcium, Ion: 1.15 mmol/L (ref 1.13–1.30)
Creatinine, Ser: 1.2 mg/dL — ABNORMAL HIGH (ref 0.50–1.10)
Hemoglobin: 10.9 g/dL — ABNORMAL LOW (ref 12.0–15.0)
Sodium: 139 mEq/L (ref 135–145)
TCO2: 24 mmol/L (ref 0–100)

## 2013-03-25 MED ORDER — ONDANSETRON HCL 4 MG/2ML IJ SOLN
4.0000 mg | Freq: Once | INTRAMUSCULAR | Status: AC
  Administered 2013-03-25: 4 mg via INTRAVENOUS
  Filled 2013-03-25: qty 2

## 2013-03-25 MED ORDER — PANTOPRAZOLE SODIUM 40 MG PO TBEC
40.0000 mg | DELAYED_RELEASE_TABLET | Freq: Once | ORAL | Status: AC
  Administered 2013-03-25: 40 mg via ORAL
  Filled 2013-03-25: qty 1

## 2013-03-25 MED ORDER — SODIUM CHLORIDE 0.9 % IV SOLN
INTRAVENOUS | Status: DC
  Administered 2013-03-25: 02:00:00 via INTRAVENOUS

## 2013-03-25 NOTE — ED Provider Notes (Signed)
History    CSN: [id number] Arrival date & time 03/24/13  2332  First MD Initiated Contact with Patient 03/25/13 0121     Chief Complaint  Patient presents with  . Hematemesis   (Consider location/radiation/quality/duration/timing/severity/associated sxs/prior Treatment) HPI Hx per PT At home, developed nausea and vomited x 1 with BRB. Since that time asymptomatic. Has h/o reflux and takes PPI, denies any ABD pain, blood in stools, no black/ tarry stools, and no coffee ground emesis. No h/o same. PT did have some raspberries and strawberries earlier in the evening. No cough or nose bleeds.   Past Medical History  Diagnosis Date  . Back pain   . GERD (gastroesophageal reflux disease)   . MI (myocardial infarction)   . Hypercholesteremia   . Diabetes mellitus   . Anemia   . Cirrhosis   . Aortic sclerosis 07/04/2011  . Aortic insufficiency 07/04/2011    most recent 2D Echo 02/24/2012  EF greater than 55%  . Carotid stenosis, bilateral 07/04/2011    most recent 05/2010 carotid doppler  . OSA (obstructive sleep apnea) 07/04/2011    sleep study 2009  AHI 9.91/hr and during REM sleep 35.58/hr  . Presence of stent in right coronary artery 07/04/2011  . HTN (hypertension) 07/04/2011  . Coronary artery disease   . Myocardial infarction 1995  . GERD (gastroesophageal reflux disease)    Past Surgical History  Procedure Laterality Date  . Back surgery  1985  . Tonsillectomy    . Appendectomy    . Dilation and curettage of uterus    . Colonoscopy    . Eye surgery  08    cataract surgery of the left eye  . Ercp    . Abdominal hysterectomy  1972  . Cardiac catheterization  1995.,2006,1997    stent to the proximal RCA after MI   . Myringotomy  2012    both ears  . Hardware removal Right 01/17/2013    Procedure: REMOVAL OF HARDWARE AND EXCISION ULNAR STYLOID RIGHT WRIST;  Surgeon: Tennis Must, MD;  Location: [facility];  Service: Orthopedics;  Laterality: Right;    Family History  Problem Relation Age of Onset  . Diabetes Mother   . Heart failure Father    History  Substance Use Topics  . Smoking status: Former Smoker -- 0.25 packs/day    Types: Cigarettes    Quit date: 01/13/2012  . Smokeless tobacco: Not on file  . Alcohol Use: No   OB History   Grav Para Term Preterm Abortions TAB SAB Ect Mult Living                 Review of Systems  Constitutional: Negative for fever and chills.  HENT: Negative for neck pain and neck stiffness.   Eyes: Negative for pain.  Respiratory: Negative for cough and shortness of breath.   Cardiovascular: Negative for chest pain.  Gastrointestinal: Negative for abdominal pain and blood in stool.  Genitourinary: Negative for dysuria.  Musculoskeletal: Negative for back pain.  Skin: Negative for rash.  Neurological: Negative for headaches.  All other systems reviewed and are negative.    Allergies  Tape  Home Medications   Current Outpatient Rx  Name  Route  Sig  Dispense  Refill  . aspirin EC 81 MG tablet   Oral   Take 81 mg by mouth daily.           . insulin glargine (LANTUS SOLOSTAR) 100 UNIT/ML injection   Subcutaneous  Inject 70 Units into the skin at bedtime.          . insulin NPH-insulin regular (NOVOLIN 70/30) (70-30) 100 UNIT/ML injection   Subcutaneous   Inject 70 Units into the skin 2 (two) times daily with a meal.          . omeprazole (PRILOSEC) 20 MG capsule   Oral   Take 20 mg by mouth daily.          . rosuvastatin (CRESTOR) 10 MG tablet   Oral   Take 1 tablet (10 mg total) by mouth at bedtime.   30 tablet   6   . sulfamethoxazole-trimethoprim (BACTRIM DS) 800-160 MG per tablet   Oral   Take 1 tablet by mouth daily.         . valsartan-hydrochlorothiazide (DIOVAN-HCT) 160-12.5 MG per tablet   Oral   Take 1 tablet by mouth daily.         . carvedilol (COREG) 6.25 MG tablet   Oral   Take 1 tablet (6.25 mg total) by mouth 2 (two) times daily with a  meal.   30 tablet   6   . HYDROcodone-acetaminophen (NORCO) 5-325 MG per tablet      1-2 tabs po q6 hours prn pain   40 tablet   0   . isosorbide mononitrate (IMDUR) 30 MG 24 hr tablet   Oral   Take 30 mg by mouth daily.          . promethazine (PHENERGAN) 25 MG tablet   Oral   Take 25 mg by mouth every 6 (six) hours as needed for nausea.         Marland Kitchen zolpidem (AMBIEN) 10 MG tablet   Oral   Take 10 mg by mouth at bedtime as needed for sleep.          BP 137/49  Pulse 96  Temp(Src) 99 F (37.2 C) (Oral)  Resp 20  Ht 5' 3"  (1.6 m)  Wt 200 lb (90.719 kg)  BMI 35.44 kg/m2  SpO2 96% Physical Exam  Constitutional: She is oriented to person, place, and time. She appears well-developed and well-nourished.  HENT:  Head: Normocephalic and atraumatic.  Mouth/Throat: Oropharynx is clear and moist.  Eyes: EOM are normal. Pupils are equal, round, and reactive to light.  Neck: Neck supple.  Cardiovascular: Normal rate, regular rhythm and intact distal pulses.   Pulmonary/Chest: Effort normal and breath sounds normal. No stridor. No respiratory distress.  Abdominal: Soft. Bowel sounds are normal. She exhibits no mass. There is no tenderness. There is no rebound and no guarding.  Genitourinary:  Rectal exam:nontender, no stool in vault  Musculoskeletal: Normal range of motion. She exhibits no edema.  Neurological: She is alert and oriented to person, place, and time.  Skin: Skin is warm and dry.    ED Course  Procedures (including critical care time) Labs Reviewed  CBC - Abnormal; Notable for the following:    RBC 3.69 (*)    Hemoglobin 10.8 (*)    HCT 32.8 (*)    Platelets 129 (*)    All other components within normal limits  POCT I-STAT, CHEM 8 - Abnormal; Notable for the following:    BUN 29 (*)    Creatinine, Ser 1.20 (*)    Glucose, Bld 392 (*)    Hemoglobin 10.9 (*)    HCT 32.0 (*)    All other components within normal limits  PH, GASTRIC FLUID (GASTROCCULT  CARD)  Dg Chest 2 View  03/25/2013   *RADIOLOGY REPORT*  Clinical Data: Bloody emesis.  CHEST - 2 VIEW  Comparison: 07/03/2011.  Findings: No evidence of infiltrate, edema, effusion, pneumothorax. Normal heart size.  Mediastinal contours are distorted by rightward rotation.  Exaggerated thoracic kyphosis with degenerative endplate spurring.  IMPRESSION: No evidence of active cardiopulmonary disease.   Original Report Authenticated By: Jorje Guild   Zofran, IVFs, PPI, work up as above  PT brought in cup of emesis from home, Gastroccult ordered and per lab is not available at this time   3:17 AM patient requesting to be discharged home, declines GI consult, remains asymptomatic in ED, no further emesis, has seen GI  Dr. Wonda Amis in the past and agrees to f/u today. She states understanding strict d/c and f/u instructions with return precautions  MDM  Single episode of BRB emesis  CXR, labs, rectal exam no evidence of GI bleed, presumed bloody emesis.   Medications provided, VS WNL, asymptomatic since single event. I offered GI consult and PT declines and wishes to f/u as outpatient today  VS and nurses notes reviewed  Teressa Lower, MD 03/25/13 2033

## 2013-03-25 NOTE — ED Notes (Signed)
Patient has bloody looking emesis in emesis bag and cup brought from home. States "I did eat some strawberries and raspberries with my supper tonight." Patient denies pain.

## 2013-03-28 ENCOUNTER — Telehealth (INDEPENDENT_AMBULATORY_CARE_PROVIDER_SITE_OTHER): Payer: Self-pay | Admitting: *Deleted

## 2013-03-28 NOTE — Telephone Encounter (Signed)
Had an episode of vomiting bright red blood on 03/24/13 about 11:30 pm. She went to the ED at [facility]. Janal would like to speak with Deberah Castle, NP. Her return phone number is [phone].

## 2013-03-28 NOTE — Telephone Encounter (Signed)
Apt has been scheduled for 03/29/13 at 9:15 am with Deberah Castle, NP.

## 2013-03-28 NOTE — Telephone Encounter (Signed)
Spoke with patient .She needs an OV

## 2013-03-29 ENCOUNTER — Ambulatory Visit (INDEPENDENT_AMBULATORY_CARE_PROVIDER_SITE_OTHER): Payer: Medicare Other | Admitting: Internal Medicine

## 2013-03-29 ENCOUNTER — Encounter (INDEPENDENT_AMBULATORY_CARE_PROVIDER_SITE_OTHER): Payer: Self-pay | Admitting: *Deleted

## 2013-03-29 ENCOUNTER — Other Ambulatory Visit (INDEPENDENT_AMBULATORY_CARE_PROVIDER_SITE_OTHER): Payer: Self-pay | Admitting: *Deleted

## 2013-03-29 ENCOUNTER — Encounter (INDEPENDENT_AMBULATORY_CARE_PROVIDER_SITE_OTHER): Payer: Self-pay | Admitting: Internal Medicine

## 2013-03-29 VITALS — BP 146/50 | HR 80 | Temp 99.6°F | Ht 63.0 in | Wt 206.3 lb

## 2013-03-29 DIAGNOSIS — I8500 Esophageal varices without bleeding: Secondary | ICD-10-CM | POA: Insufficient documentation

## 2013-03-29 DIAGNOSIS — K7460 Unspecified cirrhosis of liver: Secondary | ICD-10-CM

## 2013-03-29 DIAGNOSIS — I8510 Secondary esophageal varices without bleeding: Secondary | ICD-10-CM | POA: Insufficient documentation

## 2013-03-29 DIAGNOSIS — K920 Hematemesis: Secondary | ICD-10-CM | POA: Insufficient documentation

## 2013-03-29 NOTE — Progress Notes (Signed)
Subjective:     Patient ID: [name], female   DOB: Jan 30, 1941, 72 y.o.   MRN: [id number]  HPI 72 yr old female presenting with c/o that she vomited  Blood on the 18th of this month. Presented to the ED for Evaluation and Management. Hemoglobin was stable at 10.9.  No blood on rectal exam, (no stool in rectum). She did bring a specimen (vomitus) to the ED and this was positive for blood.  5/12 Hemoglobin was 11.4. She was offered GI consult but declined. She tells me that she became nauseated all of a sudden and vomited the blood. Afterwards, she felt fine but did go to the ED. Appetite is good. No weight loss.  No abdominal pain. She has constipation/diarrhea which started yesterday. No rectal bleeding that she could tell. + Occasionally takes an Aleve.  12/18/2006 EGD followed by Colonoscopy: Three colums of grade 2 esophageal varices which are worrisome for underlying liver disease. Small sliding hiatal hernia. Erosive antral gastritis. Few diverticula at sigmoid colon. Three small polyps ablated via cold biopsy, two at the ascending colon and one at hepatic flexure. Suspicious for adenomas. Prominent submucosal rectal veins. Biopsy: Ascending colon: Tubular adenoma. No high grade dysplasia or malignancy identified. Distal descending polyps: Tubular adenoma, no high grade dysplasia or malignancy idenitified. Hep[atic flexure polyp: tubular adenoma. No high grade dysplasia or malignancy identified.  She has a hx of cirrhosis secondary to fatty liver. She has biopsy proven cirrhosis secondary to non-alcoholic hepatitis diagnosed in 2008. She has esophageal varies.  Per Dr. Olevia Perches notes, her viral markers were all negative.   CMP     Component Value Date/Time   NA 139 03/25/2013 0216   K 4.8 03/25/2013 0216   CL 106 03/25/2013 0216   CO2 21 01/14/2013 1158   GLUCOSE 392* 03/25/2013 0216   BUN 29* 03/25/2013 0216   CREATININE 1.20* 03/25/2013 0216   CALCIUM 9.3 01/14/2013 1158   PROT 6.5  12/06/2012 2018   ALBUMIN 3.4* 12/06/2012 2018   AST 22 12/06/2012 2018   ALT 23 12/06/2012 2018   ALKPHOS 86 12/06/2012 2018   BILITOT 0.3 12/06/2012 2018   GFRNONAA 56* 01/14/2013 1158   GFRAA 65* 01/14/2013 1158      CBC    Component Value Date/Time   WBC 6.5 03/25/2013 0135   RBC 3.69* 03/25/2013 0135   HGB 10.9* 03/25/2013 0216   HCT 32.0* 03/25/2013 0216   PLT 129* 03/25/2013 0135   MCV 88.9 03/25/2013 0135   MCH 29.3 03/25/2013 0135   MCHC 32.9 03/25/2013 0135   RDW 13.3 03/25/2013 0135   LYMPHSABS 1.5 12/06/2012 2018   MONOABS 0.5 12/06/2012 2018   EOSABS 0.2 12/06/2012 2018   BASOSABS 0.0 12/06/2012 2018   12/2009 AFP 5.4   Review of Systems see hpi Current Outpatient Prescriptions  Medication Sig Dispense Refill  . carvedilol (COREG) 6.25 MG tablet Take 1 tablet (6.25 mg total) by mouth 2 (two) times daily with a meal.  30 tablet  6  . HYDROcodone-acetaminophen (NORCO) 5-325 MG per tablet 1-2 tabs po q6 hours prn pain  40 tablet  0  . insulin glargine (LANTUS SOLOSTAR) 100 UNIT/ML injection Inject 70 Units into the skin at bedtime.       . insulin NPH-insulin regular (NOVOLIN 70/30) (70-30) 100 UNIT/ML injection Inject 70 Units into the skin 2 (two) times daily with a meal.       . isosorbide mononitrate (IMDUR) 30 MG 24 hr tablet Take  30 mg by mouth daily.       Marland Kitchen omeprazole (PRILOSEC) 20 MG capsule Take 20 mg by mouth daily.       . pregabalin (LYRICA) 50 MG capsule Take 50 mg by mouth 2 (two) times daily.      . rosuvastatin (CRESTOR) 10 MG tablet Take 1 tablet (10 mg total) by mouth at bedtime.  30 tablet  6  . sulfamethoxazole-trimethoprim (BACTRIM DS) 800-160 MG per tablet Take 1 tablet by mouth daily.      . valsartan-hydrochlorothiazide (DIOVAN-HCT) 160-12.5 MG per tablet Take 1 tablet by mouth daily.      Marland Kitchen zolpidem (AMBIEN) 10 MG tablet Take 10 mg by mouth at bedtime as needed for sleep.      . promethazine (PHENERGAN) 25 MG tablet Take 25 mg by mouth every 6 (six) hours as  needed for nausea.       No current facility-administered medications for this visit.   Past Medical History  Diagnosis Date  . Back pain   . GERD (gastroesophageal reflux disease)   . MI (myocardial infarction)   . Hypercholesteremia   . Diabetes mellitus   . Anemia   . Cirrhosis   . Aortic sclerosis 07/04/2011  . Aortic insufficiency 07/04/2011    most recent 2D Echo 02/24/2012  EF greater than 55%  . Carotid stenosis, bilateral 07/04/2011    most recent 05/2010 carotid doppler  . OSA (obstructive sleep apnea) 07/04/2011    sleep study 2009  AHI 9.91/hr and during REM sleep 35.58/hr  . Presence of stent in right coronary artery 07/04/2011  . HTN (hypertension) 07/04/2011  . Coronary artery disease   . Myocardial infarction 1995  . GERD (gastroesophageal reflux disease)    Past Surgical History  Procedure Laterality Date  . Back surgery  1985  . Tonsillectomy    . Appendectomy    . Dilation and curettage of uterus    . Colonoscopy    . Eye surgery  08    cataract surgery of the left eye  . Ercp    . Abdominal hysterectomy  1972  . Cardiac catheterization  1995.,2006,1997    stent to the proximal RCA after MI   . Myringotomy  2012    both ears  . Hardware removal Right 01/17/2013    Procedure: REMOVAL OF HARDWARE AND EXCISION ULNAR STYLOID RIGHT WRIST;  Surgeon: Tennis Must, MD;  Location: [facility];  Service: Orthopedics;  Laterality: Right;  . Wrist surgery      rt wrist hardwear removal   Allergies  Allergen Reactions  . Tape Rash    ADHESIVE TAPE        Objective:   Physical Exam  Filed Vitals:   03/29/13 0950  BP: 146/50  Pulse: 80  Temp: 99.6 F (37.6 C)  Height: 5' 3"  (1.6 m)  Weight: 206 lb 4.8 oz (93.577 kg)   Alert and oriented. Skin warm and dry. Oral mucosa is moist.   . Sclera anicteric, conjunctivae is pink. Thyroid not enlarged. No cervical lymphadenopathy. Lungs clear. Heart regular rate and rhythm.  Abdomen is soft.  Bowel sounds are positive. No hepatomegaly. No abdominal masses felt. No tenderness.  No edema to lower extremities      Assessment:   Hematemesis resolved. Hx of esophageal varices diagnosed in 2008. In need of surveillance. History of NAFLD. Discussed with Dr Laural Golden.     Plan:   , pt/inr, afp, US abdomen.  OV 1 yr.  EGD with possible banding.

## 2013-03-29 NOTE — Patient Instructions (Addendum)
US abdomen, EGD/ED, AFP, PT/INR

## 2013-03-30 ENCOUNTER — Emergency Department (HOSPITAL_COMMUNITY)
Admission: EM | Admit: 2013-03-30 | Discharge: 2013-03-30 | Disposition: A | Discharge status: Home / Self Care | Payer: Medicare Other | Attending: Emergency Medicine | Admitting: Emergency Medicine

## 2013-03-30 ENCOUNTER — Telehealth (INDEPENDENT_AMBULATORY_CARE_PROVIDER_SITE_OTHER): Payer: Self-pay | Admitting: *Deleted

## 2013-03-30 ENCOUNTER — Encounter (HOSPITAL_COMMUNITY): Payer: Self-pay

## 2013-03-30 ENCOUNTER — Encounter (HOSPITAL_COMMUNITY): Payer: Self-pay | Admitting: Pharmacy Technician

## 2013-03-30 ENCOUNTER — Emergency Department (HOSPITAL_COMMUNITY): Payer: Medicare Other

## 2013-03-30 ENCOUNTER — Ambulatory Visit (HOSPITAL_COMMUNITY): Payer: Medicare Other

## 2013-03-30 ENCOUNTER — Telehealth (INDEPENDENT_AMBULATORY_CARE_PROVIDER_SITE_OTHER): Payer: Self-pay | Admitting: Internal Medicine

## 2013-03-30 DIAGNOSIS — D649 Anemia, unspecified: Secondary | ICD-10-CM | POA: Insufficient documentation

## 2013-03-30 DIAGNOSIS — Z9861 Coronary angioplasty status: Secondary | ICD-10-CM | POA: Insufficient documentation

## 2013-03-30 DIAGNOSIS — Z90710 Acquired absence of both cervix and uterus: Secondary | ICD-10-CM | POA: Insufficient documentation

## 2013-03-30 DIAGNOSIS — Z8719 Personal history of other diseases of the digestive system: Secondary | ICD-10-CM | POA: Insufficient documentation

## 2013-03-30 DIAGNOSIS — E7800 Pure hypercholesterolemia, unspecified: Secondary | ICD-10-CM | POA: Insufficient documentation

## 2013-03-30 DIAGNOSIS — R011 Cardiac murmur, unspecified: Secondary | ICD-10-CM | POA: Insufficient documentation

## 2013-03-30 DIAGNOSIS — E119 Type 2 diabetes mellitus without complications: Secondary | ICD-10-CM | POA: Insufficient documentation

## 2013-03-30 DIAGNOSIS — K219 Gastro-esophageal reflux disease without esophagitis: Secondary | ICD-10-CM | POA: Insufficient documentation

## 2013-03-30 DIAGNOSIS — Z794 Long term (current) use of insulin: Secondary | ICD-10-CM | POA: Insufficient documentation

## 2013-03-30 DIAGNOSIS — Z8679 Personal history of other diseases of the circulatory system: Secondary | ICD-10-CM | POA: Insufficient documentation

## 2013-03-30 DIAGNOSIS — Z9089 Acquired absence of other organs: Secondary | ICD-10-CM | POA: Insufficient documentation

## 2013-03-30 DIAGNOSIS — Z9889 Other specified postprocedural states: Secondary | ICD-10-CM | POA: Insufficient documentation

## 2013-03-30 DIAGNOSIS — I2510 Atherosclerotic heart disease of native coronary artery without angina pectoris: Secondary | ICD-10-CM | POA: Insufficient documentation

## 2013-03-30 DIAGNOSIS — Z87891 Personal history of nicotine dependence: Secondary | ICD-10-CM | POA: Insufficient documentation

## 2013-03-30 DIAGNOSIS — I252 Old myocardial infarction: Secondary | ICD-10-CM | POA: Insufficient documentation

## 2013-03-30 DIAGNOSIS — G4733 Obstructive sleep apnea (adult) (pediatric): Secondary | ICD-10-CM | POA: Insufficient documentation

## 2013-03-30 DIAGNOSIS — I10 Essential (primary) hypertension: Secondary | ICD-10-CM | POA: Insufficient documentation

## 2013-03-30 DIAGNOSIS — K920 Hematemesis: Secondary | ICD-10-CM

## 2013-03-30 DIAGNOSIS — Z79899 Other long term (current) drug therapy: Secondary | ICD-10-CM | POA: Insufficient documentation

## 2013-03-30 LAB — CBC WITH DIFFERENTIAL/PLATELET
Eosinophils Absolute: 0.2 10*3/uL (ref 0.0–0.7)
Eosinophils Relative: 2 % (ref 0–5)
HCT: 28.7 % — ABNORMAL LOW (ref 36.0–46.0)
Hemoglobin: 9.4 g/dL — ABNORMAL LOW (ref 12.0–15.0)
Lymphocytes Relative: 13 % (ref 12–46)
Lymphs Abs: 1.6 10*3/uL (ref 0.7–4.0)
MCH: 28.8 pg (ref 26.0–34.0)
MCV: 88 fL (ref 78.0–100.0)
Monocytes Absolute: 1.2 10*3/uL — ABNORMAL HIGH (ref 0.1–1.0)
Monocytes Relative: 10 % (ref 3–12)
RBC: 3.26 MIL/uL — ABNORMAL LOW (ref 3.87–5.11)
WBC: 12.1 10*3/uL — ABNORMAL HIGH (ref 4.0–10.5)

## 2013-03-30 LAB — BASIC METABOLIC PANEL
BUN: 25 mg/dL — ABNORMAL HIGH (ref 6–23)
CO2: 22 mEq/L (ref 19–32)
Calcium: 8.8 mg/dL (ref 8.4–10.5)
Glucose, Bld: 293 mg/dL — ABNORMAL HIGH (ref 70–99)
Sodium: 134 mEq/L — ABNORMAL LOW (ref 135–145)

## 2013-03-30 MED ORDER — ONDANSETRON HCL 4 MG/2ML IJ SOLN
4.0000 mg | Freq: Once | INTRAMUSCULAR | Status: AC
  Administered 2013-03-30: 4 mg via INTRAVENOUS
  Filled 2013-03-30: qty 2

## 2013-03-30 MED ORDER — PANTOPRAZOLE SODIUM 40 MG IV SOLR
40.0000 mg | Freq: Once | INTRAVENOUS | Status: AC
  Administered 2013-03-30: 40 mg via INTRAVENOUS
  Filled 2013-03-30: qty 40

## 2013-03-30 NOTE — ED Notes (Signed)
Pt started vomiting small amts of blood over the past hour and a half.  Pt denies pain, states she has procedures with dr Laural Golden for Thursday, not sure what he is going to do.

## 2013-03-30 NOTE — ED Provider Notes (Signed)
History    CSN: [id number] Arrival date & time 03/30/13  0124  First MD Initiated Contact with Patient 03/30/13 [phone]     Chief Complaint  Patient presents with  . Hematemesis   (Consider location/radiation/quality/duration/timing/severity/associated sxs/prior Treatment) HPI HPI Comments: [name] is a 72 y.o. female who presents to the Emergency Department complaining of vomiting small amounts of blood over the last half hour. She was seen in the ER 03/24/13 with a similar complaints. Labs and xrays were negative. She followed up with Dr. Laural Golden and is scheduled for EGD on Thursday. She is currently not taking a PPI. She has a h/o reflex and taken OTC PPI as needed. Denies fever, chills, nasuea, shortness of breath, cough. She has a h/o anemia.   PCP Dr. Orson Ape GI Dr, Laural Golden Past Medical History  Diagnosis Date  . Back pain   . GERD (gastroesophageal reflux disease)   . MI (myocardial infarction)   . Hypercholesteremia   . Diabetes mellitus   . Anemia   . Cirrhosis   . Aortic sclerosis 07/04/2011  . Aortic insufficiency 07/04/2011    most recent 2D Echo 02/24/2012  EF greater than 55%  . Carotid stenosis, bilateral 07/04/2011    most recent 05/2010 carotid doppler  . OSA (obstructive sleep apnea) 07/04/2011    sleep study 2009  AHI 9.91/hr and during REM sleep 35.58/hr  . Presence of stent in right coronary artery 07/04/2011  . HTN (hypertension) 07/04/2011  . Coronary artery disease   . Myocardial infarction 1995  . GERD (gastroesophageal reflux disease)    Past Surgical History  Procedure Laterality Date  . Back surgery  1985  . Tonsillectomy    . Appendectomy    . Dilation and curettage of uterus    . Colonoscopy    . Eye surgery  08    cataract surgery of the left eye  . Ercp    . Abdominal hysterectomy  1972  . Cardiac catheterization  1995.,2006,1997    stent to the proximal RCA after MI   . Myringotomy  2012    both ears  . Hardware removal Right  01/17/2013    Procedure: REMOVAL OF HARDWARE AND EXCISION ULNAR STYLOID RIGHT WRIST;  Surgeon: Tennis Must, MD;  Location: [place];  Service: Orthopedics;  Laterality: Right;  . Wrist surgery      rt wrist hardwear removal   Family History  Problem Relation Age of Onset  . Diabetes Mother   . Heart failure Father    History  Substance Use Topics  . Smoking status: Former Smoker -- 0.25 packs/day    Types: Cigarettes    Quit date: 01/13/2012  . Smokeless tobacco: Not on file  . Alcohol Use: No   OB History   Grav Para Term Preterm Abortions TAB SAB Ect Mult Living                 Review of Systems  Constitutional: Negative for fever.       10 Systems reviewed and are negative for acute change except as noted in the HPI.  HENT: Negative for congestion.   Eyes: Negative for discharge and redness.  Respiratory: Negative for cough and shortness of breath.   Cardiovascular: Negative for chest pain.  Gastrointestinal: Negative for vomiting and abdominal pain.       Vomiting small amounts of blood  Musculoskeletal: Negative for back pain.  Skin: Negative for rash.  Neurological: Negative for syncope, numbness  and headaches.  Psychiatric/Behavioral:       No behavior change.    Allergies  Tape  Home Medications   Current Outpatient Rx  Name  Route  Sig  Dispense  Refill  . carvedilol (COREG) 6.25 MG tablet   Oral   Take 1 tablet (6.25 mg total) by mouth 2 (two) times daily with a meal.   30 tablet   6   . HYDROcodone-acetaminophen (NORCO) 5-325 MG per tablet      1-2 tabs po q6 hours prn pain   40 tablet   0   . insulin glargine (LANTUS SOLOSTAR) 100 UNIT/ML injection   Subcutaneous   Inject 70 Units into the skin at bedtime.          . insulin NPH-insulin regular (NOVOLIN 70/30) (70-30) 100 UNIT/ML injection   Subcutaneous   Inject 70 Units into the skin 2 (two) times daily with a meal.          . isosorbide mononitrate (IMDUR) 30 MG 24  hr tablet   Oral   Take 30 mg by mouth daily.          Marland Kitchen omeprazole (PRILOSEC) 20 MG capsule   Oral   Take 20 mg by mouth daily.          . pregabalin (LYRICA) 50 MG capsule   Oral   Take 50 mg by mouth 2 (two) times daily.         . promethazine (PHENERGAN) 25 MG tablet   Oral   Take 25 mg by mouth every 6 (six) hours as needed for nausea.         . rosuvastatin (CRESTOR) 10 MG tablet   Oral   Take 1 tablet (10 mg total) by mouth at bedtime.   30 tablet   6   . sulfamethoxazole-trimethoprim (BACTRIM DS) 800-160 MG per tablet   Oral   Take 1 tablet by mouth daily.         . valsartan-hydrochlorothiazide (DIOVAN-HCT) 160-12.5 MG per tablet   Oral   Take 1 tablet by mouth daily.         Marland Kitchen zolpidem (AMBIEN) 10 MG tablet   Oral   Take 10 mg by mouth at bedtime as needed for sleep.          BP 146/48  Pulse 100  Temp(Src) 98.9 F (37.2 C) (Oral)  Resp 20  Ht 5' 3"  (1.6 m)  Wt 206 lb (93.441 kg)  BMI 36.5 kg/m2  SpO2 98% Physical Exam  Nursing note and vitals reviewed. Constitutional: She appears well-developed and well-nourished.  Awake, alert, nontoxic appearance.  HENT:  Head: Normocephalic and atraumatic.  Eyes: EOM are normal. Pupils are equal, round, and reactive to light.  Neck: Normal range of motion. Neck supple.  Cardiovascular: Normal rate and intact distal pulses.   Murmur heard. Pulmonary/Chest: Effort normal and breath sounds normal. She exhibits no tenderness.  Abdominal: Soft. Bowel sounds are normal. There is no tenderness. There is no rebound.  Musculoskeletal: She exhibits no tenderness.  Baseline ROM, no obvious new focal weakness.  Neurological:  Mental status and motor strength appears baseline for patient and situation.  Skin: No rash noted.  Psychiatric: She has a normal mood and affect.    ED Course  Procedures (including critical care time) Results for orders placed during the hospital encounter of 03/30/13  CBC WITH  DIFFERENTIAL      Result Value Range   WBC 12.1 (*) 4.0 -  10.5 K/uL   RBC 3.26 (*) 3.87 - 5.11 MIL/uL   Hemoglobin 9.4 (*) 12.0 - 15.0 g/dL   HCT 28.7 (*) 36.0 - 46.0 %   MCV 88.0  78.0 - 100.0 fL   MCH 28.8  26.0 - 34.0 pg   MCHC 32.8  30.0 - 36.0 g/dL   RDW 13.7  11.5 - 15.5 %   Platelets 148 (*) 150 - 400 K/uL   Neutrophils Relative % 75  43 - 77 %   Neutro Abs 9.1 (*) 1.7 - 7.7 K/uL   Lymphocytes Relative 13  12 - 46 %   Lymphs Abs 1.6  0.7 - 4.0 K/uL   Monocytes Relative 10  3 - 12 %   Monocytes Absolute 1.2 (*) 0.1 - 1.0 K/uL   Eosinophils Relative 2  0 - 5 %   Eosinophils Absolute 0.2  0.0 - 0.7 K/uL   Basophils Relative 0  0 - 1 %   Basophils Absolute 0.0  0.0 - 0.1 K/uL  BASIC METABOLIC PANEL      Result Value Range   Sodium 134 (*) 135 - 145 mEq/L   Potassium 4.5  3.5 - 5.1 mEq/L   Chloride 102  96 - 112 mEq/L   CO2 22  19 - 32 mEq/L   Glucose, Bld 293 (*) 70 - 99 mg/dL   BUN 25 (*) 6 - 23 mg/dL   Creatinine, Ser 1.01  0.50 - 1.10 mg/dL   Calcium 8.8  8.4 - 10.5 mg/dL   GFR calc non Af Amer 55 (*) >90 mL/min   GFR calc Af Amer 64 (*) >90 mL/min   Medications  pantoprazole (PROTONIX) injection 40 mg (not administered)  ondansetron (ZOFRAN) injection 4 mg (4 mg Intravenous Given 03/30/13 0155)  ondansetron (ZOFRAN) injection 4 mg (4 mg Intravenous Given 03/30/13 0535)    Dg Abd Acute W/chest  03/30/2013   *RADIOLOGY REPORT*  Clinical Data: Vomiting blood.  Nausea.  ACUTE ABDOMEN SERIES (ABDOMEN 2 VIEW & CHEST 1 VIEW)  Comparison: 03/25/2013  Findings: Slight fibrosis or linear atelectasis in the lung bases. The heart size and pulmonary vascularity are normal. The lungs appear clear and expanded without focal air space disease or consolidation. No blunting of the costophrenic angles.  No pneumothorax.  Mediastinal contours appear intact.  Scattered gas and stool in the colon with scattered gas filled small bowel loops.  No small or large bowel distension.  Suggestion of  wall thickening in the descending colon which may reflect colitis.  No free intra-abdominal air.  No abnormal air fluid levels.  Vascular calcifications.  Mild degenerative changes in the spine and shoulders.  IMPRESSION: No evidence of active pulmonary disease.  Nonobstructive bowel gas pattern.  Suggestion of wall thickening in the descending colon which may reflect colitis.   Original Report Authenticated By: Lucienne Capers, M.D.    MDM  Patient who presents vomiting up small amounts of blood. Previous encounter on 03/24/13 with similar occurrence. Scheduled for EGD on Thursday with Dr. Laural Golden. Labs with anemia. No vomiting since arrival in the ER. Acute abdominal series with a suggestion of colitis due to wall thickening. Will discharge the patient home with continued plan for EDG on Thursday. She is to use PPI daily. Pt stable in ED with no significant deterioration in condition.The patient appears reasonably screened and/or stabilized for discharge and I doubt any other medical condition or other [facility] requiring further screening, evaluation, or treatment in the ED at this time  prior to discharge.   MDM Reviewed: nursing note and vitals Interpretation: labs and x-ray     Gypsy Balsam. Olin Hauser, MD 03/30/13 [phone]

## 2013-03-30 NOTE — Telephone Encounter (Signed)
[name] would like to know if Promethazine 25 mg would be okay to take for nausea. That is what she was given at the ER. Her return phone number is [phone].

## 2013-03-30 NOTE — Telephone Encounter (Signed)
EGD moved up to 04/01/13, patient aware

## 2013-03-30 NOTE — Telephone Encounter (Signed)
I spoke with patient again this pm. If she has another episode of bleeding she is to go to the ED. She is to ask they we be called. Do not leave the ED.!!. We have her scheduled for Friday for EGD/Possible banding.

## 2013-03-30 NOTE — Telephone Encounter (Signed)
This has been addressed.

## 2013-03-30 NOTE — Telephone Encounter (Signed)
Sent the night vomiting blood again. Please return her call to [phone].

## 2013-03-30 NOTE — Telephone Encounter (Signed)
This patient has had a drop in her hemoglobin. Presented to ED last night again.   Ann Needs to have an EGD/banding sooner than August. She is vomiting blood

## 2013-04-01 ENCOUNTER — Encounter (HOSPITAL_COMMUNITY)
Admission: RE | Disposition: A | Discharge status: Home / Self Care | Payer: Self-pay | Source: Ambulatory Visit | Attending: Internal Medicine

## 2013-04-01 ENCOUNTER — Encounter (HOSPITAL_COMMUNITY): Payer: Self-pay | Admitting: *Deleted

## 2013-04-01 ENCOUNTER — Ambulatory Visit (HOSPITAL_COMMUNITY)
Admission: RE | Admit: 2013-04-01 | Discharge: 2013-04-01 | Disposition: A | Discharge status: Home / Self Care | Payer: Medicare Other | Source: Ambulatory Visit | Attending: Internal Medicine | Admitting: Internal Medicine

## 2013-04-01 DIAGNOSIS — K766 Portal hypertension: Secondary | ICD-10-CM

## 2013-04-01 DIAGNOSIS — Z794 Long term (current) use of insulin: Secondary | ICD-10-CM | POA: Insufficient documentation

## 2013-04-01 DIAGNOSIS — I8501 Esophageal varices with bleeding: Secondary | ICD-10-CM

## 2013-04-01 DIAGNOSIS — Z01812 Encounter for preprocedural laboratory examination: Secondary | ICD-10-CM | POA: Insufficient documentation

## 2013-04-01 DIAGNOSIS — K745 Biliary cirrhosis, unspecified: Secondary | ICD-10-CM

## 2013-04-01 DIAGNOSIS — I8510 Secondary esophageal varices without bleeding: Secondary | ICD-10-CM | POA: Insufficient documentation

## 2013-04-01 DIAGNOSIS — I10 Essential (primary) hypertension: Secondary | ICD-10-CM | POA: Insufficient documentation

## 2013-04-01 DIAGNOSIS — E119 Type 2 diabetes mellitus without complications: Secondary | ICD-10-CM | POA: Insufficient documentation

## 2013-04-01 DIAGNOSIS — K921 Melena: Secondary | ICD-10-CM

## 2013-04-01 DIAGNOSIS — K7460 Unspecified cirrhosis of liver: Secondary | ICD-10-CM | POA: Insufficient documentation

## 2013-04-01 DIAGNOSIS — K319 Disease of stomach and duodenum, unspecified: Secondary | ICD-10-CM | POA: Insufficient documentation

## 2013-04-01 HISTORY — PX: ESOPHAGEAL BANDING: SHX5518

## 2013-04-01 HISTORY — PX: ESOPHAGOGASTRODUODENOSCOPY: SHX5428

## 2013-04-01 LAB — ABO/RH: ABO/RH(D): A POS

## 2013-04-01 LAB — PREPARE RBC (CROSSMATCH)

## 2013-04-01 LAB — GLUCOSE, CAPILLARY: Glucose-Capillary: 146 mg/dL — ABNORMAL HIGH (ref 70–99)

## 2013-04-01 SURGERY — EGD (ESOPHAGOGASTRODUODENOSCOPY)
Anesthesia: Moderate Sedation

## 2013-04-01 MED ORDER — STERILE WATER FOR IRRIGATION IR SOLN
Status: DC | PRN
  Administered 2013-04-01: 09:00:00

## 2013-04-01 MED ORDER — MEPERIDINE HCL 25 MG/ML IJ SOLN
INTRAMUSCULAR | Status: DC | PRN
  Administered 2013-04-01 (×2): 25 mg via INTRAVENOUS

## 2013-04-01 MED ORDER — MEPERIDINE HCL 50 MG/ML IJ SOLN
INTRAMUSCULAR | Status: AC
  Filled 2013-04-01: qty 1

## 2013-04-01 MED ORDER — SUCRALFATE 1 GM/10ML PO SUSP: 1.0000 g | Freq: Four times a day (QID) | ORAL | Status: DC

## 2013-04-01 MED ORDER — DIPHENHYDRAMINE HCL 25 MG PO CAPS
25.0000 mg | ORAL_CAPSULE | Freq: Once | ORAL | Status: AC
  Administered 2013-04-01: 25 mg via ORAL

## 2013-04-01 MED ORDER — BUTAMBEN-TETRACAINE-BENZOCAINE 2-2-14 % EX AERO
INHALATION_SPRAY | CUTANEOUS | Status: DC | PRN
  Administered 2013-04-01: 2 via TOPICAL

## 2013-04-01 MED ORDER — FUROSEMIDE 10 MG/ML IJ SOLN
20.0000 mg | Freq: Once | INTRAMUSCULAR | Status: AC
  Administered 2013-04-01: 20 mg via INTRAVENOUS

## 2013-04-01 MED ORDER — MIDAZOLAM HCL 5 MG/5ML IJ SOLN
INTRAMUSCULAR | Status: DC | PRN
  Administered 2013-04-01 (×4): 2 mg via INTRAVENOUS

## 2013-04-01 MED ORDER — FUROSEMIDE 10 MG/ML IJ SOLN
INTRAMUSCULAR | Status: AC
  Filled 2013-04-01: qty 4

## 2013-04-01 MED ORDER — SODIUM CHLORIDE 0.9 % IV SOLN
INTRAVENOUS | Status: DC
Start: 2013-04-01 — End: 2013-04-01
  Administered 2013-04-01: 08:00:00 via INTRAVENOUS

## 2013-04-01 MED ORDER — MIDAZOLAM HCL 5 MG/5ML IJ SOLN
INTRAMUSCULAR | Status: AC
  Filled 2013-04-01: qty 10

## 2013-04-01 MED ORDER — ONDANSETRON HCL 4 MG PO TABS: 4.0000 mg | ORAL_TABLET | Freq: Two times a day (BID) | ORAL | Status: DC | PRN

## 2013-04-01 MED ORDER — DIPHENHYDRAMINE HCL 25 MG PO CAPS
ORAL_CAPSULE | ORAL | Status: AC
  Filled 2013-04-01: qty 1

## 2013-04-01 MED ORDER — NADOLOL 20 MG PO TABS: 40.0000 mg | ORAL_TABLET | Freq: Every day | ORAL | Status: DC

## 2013-04-01 NOTE — Op Note (Signed)
EGD PROCEDURE REPORT  PATIENT:  [name]  MR#:  [id number] Birthdate:  06-03-1941, 72 y.o., female Endoscopist:  Dr. Rogene Houston, MD Referred By:  Dr. Leonides Grills, MD Procedure Date: 04/01/2013  Procedure:   EGD with esophageal variceal banding.  Indications:  Patient is 72 year old Caucasian female with cirrhosis secondary to NAFLD who presents with upper GI bleed. She has history of esophageal varices but has never bled before.            Informed Consent:  The risks, benefits, alternatives & imponderables which include, but are not limited to, bleeding, infection, perforation, drug reaction and potential missed lesion have been reviewed.  The potential for biopsy, lesion removal, esophageal dilation, etc. have also been discussed.  Questions have been answered.  All parties agreeable.  Please see history & physical in medical record for more information.  Medications:  Demerol 50 mg IV Versed 8 mg IV Cetacaine spray topically for oropharyngeal anesthesia  Description of procedure:  The endoscope was introduced through the mouth and advanced to the second portion of the duodenum without difficulty or limitations. The mucosal surfaces were surveyed very carefully during advancement of the scope and upon withdrawal.  Findings:  Esophagus:  Mucosa of the proximal segment was normal. Three columns of grade II esophageal varices noted distally and one with erosion. No active bleeding noted. GEJ:  37 cm Stomach:  Stomach was empty and distended very well with insufflation. Folds in the proximal stomach are normal. Examination mucosa and body revealed snake skinning or mosaic pattern. Antral mucosa was normal. Pyloric channel was patent. Angularis fundus and cardia were examined by retroflex in the scope and were normal. No fundal varices noted. Duodenum:  Normal bulbar and post bulbar mucosa.  Therapeutic/Diagnostic Maneuvers Performed:   Three columns were banded. Varix with  erosion was banded distally and also right over the site of erosion. No bleeding noted during this maneuver. Two other columns are also banded. 4 bands were used altogether.  Complications:  None  Impression: Three columns of grade III esophageal varices and one with stigmata of bleed. All three columns were banded. Portal gastropathy.  Recommendations:  Stop Coreg(Dr. Orson Ape agrees). Nadolol 40 mg by mouth daily. Discontinue promethazine. Ondansetron 4 mg by mouth twice a day when necessary. Sucralfate 1 g by mouth a.c. and each bedtime for 2 weeks. Will check H&H today. Office visit in 4 weeks. Patient advised not to take aspirin or NSAIDs for now.  Ladarious Kresse U  04/01/2013  9:17 AM  CC: Dr. Leonides Grills, MD & Dr. Rayne Du ref. provider found

## 2013-04-01 NOTE — H&P (Signed)
[name] is an 72 y.o. female.   Chief Complaint: Patient is here for EGD. HPI: Patient is 72 year old Caucasian female with history of cirrhosis secondary to NAFLD and history of esophageal varices. She experienced hematemesis about 10 days ago. She was seen in emergency room and discharged. She was seen in our office on 03/29/2013 and scheduled for EGD. She experience another episode of hematemesis on 03/30/2013. She was seen in emergency room and once again discharged. Since her last visit to the ER he has not experienced any more hematemesis but her stools were black yesterday. She denies heartburn dysphagia or abdominal pain. She has noted weakness and postural dizziness/ She does not take OTC NSAIDs other than low-dose aspirin which was discontinued at the time of office visit. H&H on 03/30/2013 was 9.4 and 28.7 and platelet count 148K  Past Medical History  Diagnosis Date  . Back pain   . GERD (gastroesophageal reflux disease)   . MI (myocardial infarction)   . Hypercholesteremia   . Diabetes mellitus   . Anemia   . Cirrhosis   . Aortic sclerosis 07/04/2011  . Aortic insufficiency 07/04/2011    most recent 2D Echo 02/24/2012  EF greater than 55%  . Carotid stenosis, bilateral 07/04/2011    most recent 05/2010 carotid doppler  . OSA (obstructive sleep apnea) 07/04/2011    sleep study 2009  AHI 9.91/hr and during REM sleep 35.58/hr  . Presence of stent in right coronary artery 07/04/2011  . HTN (hypertension) 07/04/2011  . Coronary artery disease   . Myocardial infarction 1995  . GERD (gastroesophageal reflux disease)     Past Surgical History  Procedure Laterality Date  . Back surgery  1985  . Tonsillectomy    . Appendectomy    . Dilation and curettage of uterus    . Colonoscopy    . Eye surgery  08    cataract surgery of the left eye  . Ercp    . Abdominal hysterectomy  1972  . Cardiac catheterization  1995.,2006,1997    stent to the proximal RCA after MI   .  Myringotomy  2012    both ears  . Hardware removal Right 01/17/2013    Procedure: REMOVAL OF HARDWARE AND EXCISION ULNAR STYLOID RIGHT WRIST;  Surgeon: Tennis Must, MD;  Location: [place];  Service: Orthopedics;  Laterality: Right;  . Wrist surgery      rt wrist hardwear removal    Family History  Problem Relation Age of Onset  . Diabetes Mother   . Heart failure Father    Social History:  reports that she quit smoking about 14 months ago. Her smoking use included Cigarettes. She smoked 0.25 packs per day. She does not have any smokeless tobacco history on file. She reports that she does not drink alcohol or use illicit drugs.  Allergies:  Allergies  Allergen Reactions  . Tape Rash    ADHESIVE TAPE    Medications Prior to Admission  Medication Sig Dispense Refill  . aspirin EC 81 MG tablet Take 81 mg by mouth daily.      . carvedilol (COREG) 6.25 MG tablet Take 1 tablet (6.25 mg total) by mouth 2 (two) times daily with a meal.  30 tablet  6  . HYDROcodone-acetaminophen (NORCO) 5-325 MG per tablet 1-2 tabs po q6 hours prn pain  40 tablet  0  . insulin glargine (LANTUS SOLOSTAR) 100 UNIT/ML injection Inject 70 Units into the skin at bedtime.       Marland Kitchen  insulin NPH-insulin regular (NOVOLIN 70/30) (70-30) 100 UNIT/ML injection Inject 70 Units into the skin 2 (two) times daily with a meal.       . isosorbide mononitrate (IMDUR) 30 MG 24 hr tablet Take 30 mg by mouth daily.       Marland Kitchen omeprazole (PRILOSEC) 20 MG capsule Take 20 mg by mouth daily.       . pregabalin (LYRICA) 50 MG capsule Take 50 mg by mouth at bedtime.       . promethazine (PHENERGAN) 25 MG tablet Take 25 mg by mouth every 6 (six) hours as needed for nausea.      . rosuvastatin (CRESTOR) 10 MG tablet Take 1 tablet (10 mg total) by mouth at bedtime.  30 tablet  6  . sulfamethoxazole-trimethoprim (BACTRIM DS) 800-160 MG per tablet Take 1 tablet by mouth daily.      . valsartan-hydrochlorothiazide (DIOVAN-HCT)  160-12.5 MG per tablet Take 1 tablet by mouth daily.      Marland Kitchen zolpidem (AMBIEN) 5 MG tablet Take 5 mg by mouth at bedtime as needed for sleep.        Results for orders placed during the hospital encounter of 04/01/13 (from the past 48 hour(s))  GLUCOSE, CAPILLARY     Status: Abnormal   Collection Time    04/01/13  7:45 AM      Result Value Range   Glucose-Capillary 146 (*) 70 - 99 mg/dL   No results found.  ROS  Blood pressure 127/55, pulse 85, temperature 98.9 F (37.2 C), temperature source Oral, resp. rate 18, SpO2 95.00%. Physical Exam  Constitutional: She appears well-developed and well-nourished.  HENT:  Mouth/Throat: Oropharynx is clear and moist.  Eyes: Conjunctivae are normal. No scleral icterus.  Neck: No thyromegaly present.  Cardiovascular: Normal rate, regular rhythm and normal heart sounds.   No murmur heard. Respiratory: Effort normal and breath sounds normal.  GI: Soft. She exhibits no distension and no mass. There is no tenderness.  Musculoskeletal: She exhibits no edema.  Lymphadenopathy:    She has no cervical adenopathy.  Neurological: She is alert.  Skin: Skin is warm and dry.     Assessment/Plan Upper GI bleed in a patient with history of cirrhosis and esophageal varices. Anemia secondary to UGI bleed. EGD with therapeutic intentention. If patient has large esophageal varices these with the dentist today.  REHMAN,NAJEEB U 04/01/2013, 8:38 AM

## 2013-04-02 LAB — TYPE AND SCREEN

## 2013-04-04 ENCOUNTER — Ambulatory Visit (HOSPITAL_COMMUNITY)
Admit: 2013-04-04 | Discharge: 2013-04-04 | Disposition: A | Discharge status: Home / Self Care | Payer: Medicare Other | Attending: Internal Medicine | Admitting: Internal Medicine

## 2013-04-04 ENCOUNTER — Telehealth (HOSPITAL_COMMUNITY): Payer: Self-pay | Admitting: Dietician

## 2013-04-04 ENCOUNTER — Encounter (HOSPITAL_COMMUNITY): Payer: Self-pay | Admitting: Internal Medicine

## 2013-04-04 DIAGNOSIS — K7460 Unspecified cirrhosis of liver: Secondary | ICD-10-CM

## 2013-04-04 NOTE — Telephone Encounter (Deleted)
Received referral via telephone from message left on 04/01/13 at 1003 from [place], referral coordinator for Dr. Laural Golden. Dx: weight reduction and DM.

## 2013-04-07 NOTE — Telephone Encounter (Signed)
[  Note created in error - please disregard]

## 2013-04-13 ENCOUNTER — Other Ambulatory Visit: Payer: Self-pay

## 2013-04-27 ENCOUNTER — Telehealth (INDEPENDENT_AMBULATORY_CARE_PROVIDER_SITE_OTHER): Payer: Self-pay | Admitting: *Deleted

## 2013-04-27 DIAGNOSIS — D649 Anemia, unspecified: Secondary | ICD-10-CM

## 2013-04-27 NOTE — Telephone Encounter (Signed)
Per Dr.Rehman the patient will need to have labs drawn after office visit with Lelon Perla on 05/03/13.

## 2013-05-03 ENCOUNTER — Encounter (INDEPENDENT_AMBULATORY_CARE_PROVIDER_SITE_OTHER): Payer: Self-pay | Admitting: Internal Medicine

## 2013-05-03 ENCOUNTER — Ambulatory Visit (INDEPENDENT_AMBULATORY_CARE_PROVIDER_SITE_OTHER): Payer: Medicare Other | Admitting: Internal Medicine

## 2013-05-03 ENCOUNTER — Other Ambulatory Visit (INDEPENDENT_AMBULATORY_CARE_PROVIDER_SITE_OTHER): Payer: Self-pay | Admitting: Internal Medicine

## 2013-05-03 VITALS — BP 120/50 | HR 76 | Temp 98.1°F | Ht 63.0 in | Wt 208.0 lb

## 2013-05-03 DIAGNOSIS — I8500 Esophageal varices without bleeding: Secondary | ICD-10-CM

## 2013-05-03 DIAGNOSIS — K760 Fatty (change of) liver, not elsewhere classified: Secondary | ICD-10-CM

## 2013-05-03 DIAGNOSIS — K7689 Other specified diseases of liver: Secondary | ICD-10-CM

## 2013-05-03 LAB — HEMOGLOBIN AND HEMATOCRIT, BLOOD: HCT: 29.9 % — ABNORMAL LOW (ref 36.0–46.0)

## 2013-05-03 NOTE — Progress Notes (Signed)
Subjective:     Patient ID: [name], female   DOB: Jun 11, 1941, 72 y.o.   MRN: [id number]  HPI Here today for f/u after recently undergoing an EGD with esophageal. Please see below. She has a hx of NAFLD. She tells me she is doing good. She stopped the Corgard because because it made her feel tired and it dropped her blood pressure. She has not had any nausea or vomiting. Her BMs are brown in color.  She tells me she feels good. Her appetite is good. No weight loss.  She occasionally has dizziness.  04/01/2013 EGD with esophageal banding: .  Indications: Patient is 72 year old Caucasian female with cirrhosis secondary to NAFLD who presents with upper GI bleed. She has history of esophageal varices but has never bled before.  Impression:  Three columns of grade III esophageal varices and one with stigmata of bleed.  All three columns were banded.  Portal gastropathy.  03/29/2013 US Abdomen: 1. Echogenic liver parenchyma with lobular contour is consistent  the patient's history of cirrhosis. No focal hepatic lesion by  ultrasound examination.  2. Potential splenomegaly.  CMP     Component Value Date/Time   NA 134* 03/30/2013 0214   K 4.5 03/30/2013 0214   CL 102 03/30/2013 0214   CO2 22 03/30/2013 0214   GLUCOSE 293* 03/30/2013 0214   BUN 25* 03/30/2013 0214   CREATININE 1.01 03/30/2013 0214   CALCIUM 8.8 03/30/2013 0214   PROT 6.5 12/06/2012 2018   ALBUMIN 3.4* 12/06/2012 2018   AST 22 12/06/2012 2018   ALT 23 12/06/2012 2018   ALKPHOS 86 12/06/2012 2018   BILITOT 0.3 12/06/2012 2018   GFRNONAA 55* 03/30/2013 0214   GFRAA 64* 03/30/2013 0214      CBC    Component Value Date/Time   WBC 12.1* 03/30/2013 0214   RBC 3.26* 03/30/2013 0214   HGB 7.5* 04/01/2013 0916   HCT 23.0* 04/01/2013 0916   PLT 148* 03/30/2013 0214   MCV 88.0 03/30/2013 0214   MCH 28.8 03/30/2013 0214   MCHC 32.8 03/30/2013 0214   RDW 13.7 03/30/2013 0214   LYMPHSABS 1.6 03/30/2013 0214   MONOABS 1.2* 03/30/2013 0214    EOSABS 0.2 03/30/2013 0214   BASOSABS 0.0 03/30/2013 0214       Review of Systems Current Outpatient Prescriptions  Medication Sig Dispense Refill  . insulin glargine (LANTUS SOLOSTAR) 100 UNIT/ML injection Inject 70 Units into the skin at bedtime.       . insulin NPH-insulin regular (NOVOLIN 70/30) (70-30) 100 UNIT/ML injection Inject 70 Units into the skin 2 (two) times daily with a meal.       . isosorbide mononitrate (IMDUR) 30 MG 24 hr tablet Take 30 mg by mouth daily.       Marland Kitchen omeprazole (PRILOSEC) 20 MG capsule Take 20 mg by mouth daily.       . ondansetron (ZOFRAN) 4 MG tablet Take 1 tablet (4 mg total) by mouth every 12 (twelve) hours as needed for nausea.  20 tablet  0  . rosuvastatin (CRESTOR) 10 MG tablet Take 1 tablet (10 mg total) by mouth at bedtime.  30 tablet  6  . sulfamethoxazole-trimethoprim (BACTRIM DS) 800-160 MG per tablet Take 1 tablet by mouth daily.      . valsartan-hydrochlorothiazide (DIOVAN-HCT) 160-12.5 MG per tablet Take 1 tablet by mouth daily.      . nadolol (CORGARD) 20 MG tablet Take 2 tablets (40 mg total) by mouth daily.  [place]  tablet  5   No current facility-administered medications for this visit.   Past Medical History  Diagnosis Date  . Back pain   . GERD (gastroesophageal reflux disease)   . MI (myocardial infarction)   . Hypercholesteremia   . Diabetes mellitus   . Anemia   . Cirrhosis   . Aortic sclerosis 07/04/2011  . Aortic insufficiency 07/04/2011    most recent 2D Echo 02/24/2012  EF greater than 55%  . Carotid stenosis, bilateral 07/04/2011    most recent 05/2010 carotid doppler  . OSA (obstructive sleep apnea) 07/04/2011    sleep study 2009  AHI 9.91/hr and during REM sleep 35.58/hr  . Presence of stent in right coronary artery 07/04/2011  . HTN (hypertension) 07/04/2011  . Coronary artery disease   . Myocardial infarction 1995  . GERD (gastroesophageal reflux disease)    Past Surgical History  Procedure Laterality Date  .  Back surgery  1985  . Tonsillectomy    . Appendectomy    . Dilation and curettage of uterus    . Colonoscopy    . Eye surgery  08    cataract surgery of the left eye  . Ercp    . Abdominal hysterectomy  1972  . Cardiac catheterization  1995.,2006,1997    stent to the proximal RCA after MI   . Myringotomy  2012    both ears  . Hardware removal Right 01/17/2013    Procedure: REMOVAL OF HARDWARE AND EXCISION ULNAR STYLOID RIGHT WRIST;  Surgeon: Tennis Must, MD;  Location: [place];  Service: Orthopedics;  Laterality: Right;  . Wrist surgery      rt wrist hardwear removal  . Esophagogastroduodenoscopy N/A 04/01/2013    Procedure: ESOPHAGOGASTRODUODENOSCOPY (EGD);  Surgeon: Rogene Houston, MD;  [facility];  Service: Endoscopy;  Laterality: N/A;  230-rescheduled to 8:30am Ann notified pt  . Esophageal banding N/A 04/01/2013    Procedure: ESOPHAGEAL BANDING;  Surgeon: Rogene Houston, MD;  [facility];  Service: Endoscopy;  Laterality: N/A;   Allergies  Allergen Reactions  . Tape Rash    ADHESIVE TAPE        Objective:   Physical Exam   Filed Vitals:   05/03/13 1008  BP: 120/50  Pulse: 76  Temp: 98.1 F (36.7 C)  Height: 5' 3"  (1.6 m)  Weight: 208 lb (94.348 kg)   Alert and oriented. Skin warm and dry. Oral mucosa is moist.   . Sclera anicteric, conjunctivae is pink. Thyroid not enlarged. No cervical lymphadenopathy. Lungs clear. Heart regular rate and rhythm.  Abdomen is soft. Bowel sounds are positive. No hepatomegaly. No abdominal masses felt. No tenderness.  No edema to lower extremities.       Assessment:    Upper GI bleed for esophageal varices. Underwent a band in July. Hx of NAFLD. She seems to be doing well now. Needs a repeat H andH.     Plan:    H and H today. OV in 6 months. Corgard 6m daily.

## 2013-05-03 NOTE — Patient Instructions (Addendum)
OV in 6 months. With a CBC.  Corgard 30m daily

## 2013-05-04 ENCOUNTER — Telehealth (INDEPENDENT_AMBULATORY_CARE_PROVIDER_SITE_OTHER): Payer: Self-pay | Admitting: *Deleted

## 2013-05-04 DIAGNOSIS — I8500 Esophageal varices without bleeding: Secondary | ICD-10-CM

## 2013-05-04 NOTE — Telephone Encounter (Signed)
.  Per Lelon Perla the patient will need to have drawn in 6 months

## 2013-05-04 NOTE — Telephone Encounter (Signed)
Derinda said she was taking Nadolol 20 mg x2 daily. She was wrong to please let Terri know. Kenneisha will decrease the Nadolol to 1 tablet  daily instead of 1/2 tablet like they had discussed. If there is any questions to please return her call at [phone].

## 2013-05-05 NOTE — Telephone Encounter (Signed)
Decrease to 64m a day.

## 2013-05-05 NOTE — Telephone Encounter (Signed)
Reduce to 43m a day

## 2013-05-10 ENCOUNTER — Other Ambulatory Visit: Payer: Self-pay | Admitting: *Deleted

## 2013-05-10 MED ORDER — ISOSORBIDE MONONITRATE ER 30 MG PO TB24: 30.0000 mg | ORAL_TABLET | Freq: Every day | ORAL | Status: DC

## 2013-05-10 NOTE — Telephone Encounter (Signed)
Rx was sent to pharmacy electronically. 

## 2013-05-11 ENCOUNTER — Encounter (INDEPENDENT_AMBULATORY_CARE_PROVIDER_SITE_OTHER): Payer: Self-pay | Admitting: *Deleted

## 2013-05-11 ENCOUNTER — Encounter (HOSPITAL_COMMUNITY): Payer: Self-pay | Admitting: Pharmacy Technician

## 2013-05-11 ENCOUNTER — Other Ambulatory Visit (INDEPENDENT_AMBULATORY_CARE_PROVIDER_SITE_OTHER): Payer: Self-pay | Admitting: *Deleted

## 2013-05-11 DIAGNOSIS — K760 Fatty (change of) liver, not elsewhere classified: Secondary | ICD-10-CM

## 2013-05-11 DIAGNOSIS — K7460 Unspecified cirrhosis of liver: Secondary | ICD-10-CM

## 2013-05-24 ENCOUNTER — Encounter (HOSPITAL_COMMUNITY)
Admission: RE | Disposition: A | Discharge status: Home Health | Payer: Self-pay | Source: Ambulatory Visit | Attending: Internal Medicine

## 2013-05-24 ENCOUNTER — Ambulatory Visit (HOSPITAL_COMMUNITY)
Admission: RE | Admit: 2013-05-24 | Discharge: 2013-05-24 | Disposition: A | Discharge status: Home Health | Payer: Medicare Other | Source: Ambulatory Visit | Attending: Internal Medicine | Admitting: Internal Medicine

## 2013-05-24 ENCOUNTER — Encounter (HOSPITAL_COMMUNITY): Payer: Self-pay | Admitting: *Deleted

## 2013-05-24 DIAGNOSIS — Z01812 Encounter for preprocedural laboratory examination: Secondary | ICD-10-CM | POA: Insufficient documentation

## 2013-05-24 DIAGNOSIS — K31819 Angiodysplasia of stomach and duodenum without bleeding: Secondary | ICD-10-CM

## 2013-05-24 DIAGNOSIS — K7460 Unspecified cirrhosis of liver: Secondary | ICD-10-CM

## 2013-05-24 DIAGNOSIS — I10 Essential (primary) hypertension: Secondary | ICD-10-CM | POA: Insufficient documentation

## 2013-05-24 DIAGNOSIS — K760 Fatty (change of) liver, not elsewhere classified: Secondary | ICD-10-CM

## 2013-05-24 DIAGNOSIS — K449 Diaphragmatic hernia without obstruction or gangrene: Secondary | ICD-10-CM

## 2013-05-24 DIAGNOSIS — I8510 Secondary esophageal varices without bleeding: Secondary | ICD-10-CM | POA: Insufficient documentation

## 2013-05-24 DIAGNOSIS — K319 Disease of stomach and duodenum, unspecified: Secondary | ICD-10-CM | POA: Insufficient documentation

## 2013-05-24 DIAGNOSIS — E119 Type 2 diabetes mellitus without complications: Secondary | ICD-10-CM | POA: Insufficient documentation

## 2013-05-24 HISTORY — DX: Fatty (change of) liver, not elsewhere classified: K76.0

## 2013-05-24 HISTORY — PX: ESOPHAGOGASTRODUODENOSCOPY: SHX5428

## 2013-05-24 HISTORY — PX: ESOPHAGEAL BANDING: SHX5518

## 2013-05-24 LAB — CBC
HCT: 30.3 % — ABNORMAL LOW (ref 36.0–46.0)
Platelets: 120 10*3/uL — ABNORMAL LOW (ref 150–400)
RDW: 16.4 % — ABNORMAL HIGH (ref 11.5–15.5)
WBC: 5 10*3/uL (ref 4.0–10.5)

## 2013-05-24 LAB — GLUCOSE, CAPILLARY: Glucose-Capillary: 112 mg/dL — ABNORMAL HIGH (ref 70–99)

## 2013-05-24 SURGERY — EGD (ESOPHAGOGASTRODUODENOSCOPY)
Anesthesia: Moderate Sedation

## 2013-05-24 MED ORDER — MIDAZOLAM HCL 5 MG/5ML IJ SOLN
INTRAMUSCULAR | Status: AC
  Filled 2013-05-24: qty 10

## 2013-05-24 MED ORDER — SODIUM CHLORIDE 0.9 % IV SOLN
INTRAVENOUS | Status: DC
  Administered 2013-05-24: 07:00:00 via INTRAVENOUS

## 2013-05-24 MED ORDER — SUCRALFATE 1 G PO TABS: 1.0000 g | ORAL_TABLET | Freq: Four times a day (QID) | ORAL | Status: DC

## 2013-05-24 MED ORDER — MEPERIDINE HCL 50 MG/ML IJ SOLN
INTRAMUSCULAR | Status: AC
  Filled 2013-05-24: qty 1

## 2013-05-24 MED ORDER — MIDAZOLAM HCL 5 MG/5ML IJ SOLN
INTRAMUSCULAR | Status: DC | PRN
  Administered 2013-05-24 (×4): 2 mg via INTRAVENOUS

## 2013-05-24 MED ORDER — STERILE WATER FOR IRRIGATION IR SOLN
Status: DC | PRN
  Administered 2013-05-24: 08:00:00

## 2013-05-24 MED ORDER — MEPERIDINE HCL 50 MG/ML IJ SOLN
INTRAMUSCULAR | Status: DC | PRN
  Administered 2013-05-24 (×2): 25 mg via INTRAVENOUS

## 2013-05-24 MED ORDER — BUTAMBEN-TETRACAINE-BENZOCAINE 2-2-14 % EX AERO
INHALATION_SPRAY | CUTANEOUS | Status: DC | PRN
  Administered 2013-05-24: 2 via TOPICAL

## 2013-05-24 NOTE — Op Note (Signed)
EGD PROCEDURE REPORT  PATIENT:  [name]  MR#:  [id number] Birthdate:  May 10, 1941, 72 y.o., female Endoscopist:  Dr. Rogene Houston, MD Referred By:  Dr. Leonides Grills, MD  Procedure Date: 05/24/2013  Procedure:   EGD with esophageal variceal banding.  Indications:  Patient is 72 year old Caucasian female with history of cirrhosis secondary to NAFLD complicated by esophageal variceal bleed. Her first session was on 04/01/2013. Her H&H is still low but she hasn't had any more episodes of melena.            Informed Consent:  The risks, benefits, alternatives & imponderables which include, but are not limited to, bleeding, infection, perforation, drug reaction and potential missed lesion have been reviewed.  The potential for biopsy, lesion removal, esophageal dilation, etc. have also been discussed.  Questions have been answered.  All parties agreeable.  Please see history & physical in medical record for more information.  Medications:  Demerol 50 mg IV Versed 8 mg IV Cetacaine spray topically for oropharyngeal anesthesia  Description of procedure:  The endoscope was introduced through the mouth and advanced to the second portion of the duodenum without difficulty or limitations. The mucosal surfaces were surveyed very carefully during advancement of the scope and upon withdrawal.  Findings:  Esophagus:  Mucosa of the proximal esophagus was normal. Two columns of grade 3 esophageal varices noted distally. One column was short and the other one was long. Scarring noted from previous banding. GEJ:  36 cm Hiatus:  38 cm Stomach:  Stomach was empty and distended very well with insufflation. Folds in the proximal stomach were normal. Examination of mucosa at body revealed mosaic pattern. Linear streaks of telangiectasia noted at the gastric antrum without stigmata of bleeding. Pyloric channel was patent. Angularis fundus and cardia were examined by retroflex the scope and were  unremarkable. Fundal varices not present. Duodenum:  Normal bulbar and post bulbar mucosa.  Therapeutic/Diagnostic Maneuvers Performed:   Endoscope was withdrawn and multiple banding device loaded onto scope which was reintroduced. Short column was banded once and long column was banded at two levels.  Complications:  None  Impression: Two columns of esophageal varices were banded. One column was banded at two levels. These were grade III. Small sliding hiatal hernia. Portal gastropathy and gastric antral vascular ectasia without stigmata of bleed.  Recommendations:  Full liquids for 24 hours. Sucralfate 1 g by mouth a.c. and each bedtime for 2 weeks. CBC today. Office visit in 12 weeks.  Deepika [place] U  05/24/2013  8:08 AM  CC: Dr. Leonides Grills, MD & Dr. Rayne Du ref. provider found

## 2013-05-24 NOTE — H&P (Signed)
[name] is an 72 y.o. female.   Chief Complaint: Patient's here for EGD and possible esophageal variceal banding. HPI: Patient is 72 year old Caucasian female with cirrhosis secondary to NAFLD complicated by esophageal variceal bleed. She underwent first session of banding on July 20 01/07/2013 and hasn't had anymore episodes of melena. Hemoglobin 3 weeks ago was 9.4. She complains of extreme weakness despite dropping Nadolol dose to half. She denies heartburn dysphagia nausea or vomiting. She says she is trying to walk at least 15 minutes on treadmill every day.  Past Medical History  Diagnosis Date  . Back pain   . GERD (gastroesophageal reflux disease)   . MI (myocardial infarction)   . Hypercholesteremia   . Diabetes mellitus   . Anemia   . Cirrhosis   . Aortic sclerosis 07/04/2011  . Aortic insufficiency 07/04/2011    most recent 2D Echo 02/24/2012  EF greater than 55%  . Carotid stenosis, bilateral 07/04/2011    most recent 05/2010 carotid doppler  . OSA (obstructive sleep apnea) 07/04/2011    sleep study 2009  AHI 9.91/hr and during REM sleep 35.58/hr  . Presence of stent in right coronary artery 07/04/2011  . HTN (hypertension) 07/04/2011  . Coronary artery disease   . Myocardial infarction 1995  . GERD (gastroesophageal reflux disease)   . Non-alcoholic fatty liver disease     Past Surgical History  Procedure Laterality Date  . Back surgery  1985  . Tonsillectomy    . Appendectomy    . Dilation and curettage of uterus    . Colonoscopy    . Eye surgery  08    cataract surgery of the left eye  . Ercp    . Abdominal hysterectomy  1972  . Cardiac catheterization  1995.,2006,1997    stent to the proximal RCA after MI   . Myringotomy  2012    both ears  . Hardware removal Right 01/17/2013    Procedure: REMOVAL OF HARDWARE AND EXCISION ULNAR STYLOID RIGHT WRIST;  Surgeon: Tennis Must, MD;  Location: [place];  Service: Orthopedics;  Laterality:  Right;  . Wrist surgery      rt wrist hardwear removal  . Esophagogastroduodenoscopy N/A 04/01/2013    Procedure: ESOPHAGOGASTRODUODENOSCOPY (EGD);  Surgeon: Rogene Houston, MD;  [facility];  Service: Endoscopy;  Laterality: N/A;  230-rescheduled to 8:30am Ann notified pt  . Esophageal banding N/A 04/01/2013    Procedure: ESOPHAGEAL BANDING;  Surgeon: Rogene Houston, MD;  [facility];  Service: Endoscopy;  Laterality: N/A;    Family History  Problem Relation Age of Onset  . Diabetes Mother   . Heart failure Father    Social History:  reports that she quit smoking about 16 months ago. Her smoking use included Cigarettes. She smoked 0.25 packs per day. She does not have any smokeless tobacco history on file. She reports that she does not drink alcohol or use illicit drugs.  Allergies:  Allergies  Allergen Reactions  . Tape Rash    ADHESIVE TAPE    Medications Prior to Admission  Medication Sig Dispense Refill  . aspirin EC 81 MG tablet Take 81 mg by mouth daily.      . carvedilol (COREG) 6.25 MG tablet Take 6.25 mg by mouth 2 (two) times daily with a meal.      . ferrous sulfate 325 (65 FE) MG tablet Take 325 mg by mouth daily with breakfast.      .  insulin glargine (LANTUS SOLOSTAR) 100 UNIT/ML injection Inject 72 Units into the skin at bedtime.       . isosorbide mononitrate (IMDUR) 30 MG 24 hr tablet Take 1 tablet (30 mg total) by mouth daily.  30 tablet  8  . omeprazole (PRILOSEC) 20 MG capsule Take 20 mg by mouth daily.       . rosuvastatin (CRESTOR) 10 MG tablet Take 1 tablet (10 mg total) by mouth at bedtime.  30 tablet  6  . sulfamethoxazole-trimethoprim (BACTRIM DS) 800-160 MG per tablet Take 1 tablet by mouth daily.      . traMADol (ULTRAM) 50 MG tablet Take 50 mg by mouth 3 (three) times daily as needed for pain.       . valsartan-hydrochlorothiazide (DIOVAN-HCT) 160-12.5 MG per tablet Take 1 tablet by mouth daily.      Marland Kitchen zolpidem (AMBIEN) 5 MG  tablet Take 5 mg by mouth at bedtime.      . insulin NPH-insulin regular (NOVOLIN 70/30) (70-30) 100 UNIT/ML injection Inject 72 Units into the skin 2 (two) times daily with a meal.       . promethazine (PHENERGAN) 25 MG tablet Take 25 mg by mouth every 6 (six) hours as needed for nausea.        Results for orders placed during the hospital encounter of 05/24/13 (from the past 48 hour(s))  GLUCOSE, CAPILLARY     Status: Abnormal   Collection Time    05/24/13  7:20 AM      Result Value Range   Glucose-Capillary 112 (*) 70 - 99 mg/dL   Comment 1 Documented in Chart     No results found.  ROS  Blood pressure 141/49, resp. rate 18, height 5' 3"  (1.6 m), weight 208 lb (94.348 kg), SpO2 97.00%. Physical Exam  Constitutional: She appears well-developed and well-nourished.  HENT:  Mouth/Throat: Oropharynx is clear and moist.  Eyes: Conjunctivae are normal. No scleral icterus.  Neck: No thyromegaly present.  Cardiovascular: Normal rate, regular rhythm and normal heart sounds.   No murmur heard. Respiratory: Effort normal and breath sounds normal.  GI: Soft. She exhibits no distension and no mass. There is no tenderness.  Musculoskeletal: She exhibits no edema.  Lymphadenopathy:    She has no cervical adenopathy.  Neurological: She is alert.  Skin: Skin is warm.     Assessment/Plan History of esophageal variceal bleed. Esophageal varices initially presented on 04/01/2013. EGD with variceal banding. Cirrhosis secondary to NAFLD.  REHMAN,NAJEEB U 05/24/2013, 7:35 AM

## 2013-05-26 ENCOUNTER — Encounter (HOSPITAL_COMMUNITY): Payer: Self-pay | Admitting: Internal Medicine

## 2013-06-17 ENCOUNTER — Encounter (INDEPENDENT_AMBULATORY_CARE_PROVIDER_SITE_OTHER): Payer: Self-pay

## 2013-07-14 ENCOUNTER — Other Ambulatory Visit: Payer: Self-pay

## 2013-07-20 ENCOUNTER — Encounter: Payer: Self-pay | Admitting: Internal Medicine

## 2013-07-20 ENCOUNTER — Ambulatory Visit (INDEPENDENT_AMBULATORY_CARE_PROVIDER_SITE_OTHER): Payer: Medicare Other | Admitting: Internal Medicine

## 2013-07-20 VITALS — BP 120/60 | HR 80 | Ht 63.0 in | Wt 205.9 lb

## 2013-07-20 DIAGNOSIS — E785 Hyperlipidemia, unspecified: Secondary | ICD-10-CM

## 2013-07-20 DIAGNOSIS — I2510 Atherosclerotic heart disease of native coronary artery without angina pectoris: Secondary | ICD-10-CM

## 2013-07-20 DIAGNOSIS — R0989 Other specified symptoms and signs involving the circulatory and respiratory systems: Secondary | ICD-10-CM

## 2013-07-20 NOTE — Progress Notes (Signed)
OFFICE NOTE  Chief Complaint:  Routine follow-up  Primary Care Physician: Leonides Grills, MD  HPI:  [name] a 72 year old female with history of MI in 1995, two stents to the right coronary, mild carotid disease, obesity, aortic sclerosis, and mild AI. She had chest pain this fall, had a negative nuclear stress test and some tachypalpitations which stopped pretty abruptly. She wore a monitor for 30 days which showed basically infrequent PACs and no tachyarrhythmias. I was concerned about AFib or an SVT. Recently she had an episode of tachycardia, a heart rate of 160, very regular. She presented to the hospital for evaluation and the heart rate was normal when she arrived. I suspect, as she noted she thought this was a very regular fast heart rate, it may be an SVT. She denied any chest pain at that point and actually feels pretty well.  On exam today there are increased bilateral carotid bruits, slightly louder on the right than the left. Of note there was a prior CT angiogram performed on 05/17/2010 which showed a chunky calcified plaque at the left common carotid bifurcation and stenosis of approximately 25-30%. There was also calcified carotid disease of the right carotid as well. She reports pretty good blood sugar control with fasting blood glucoses in the 110-120 range on insulin. She's not had lipid profile testing in over 6 months.  PMHx:  Past Medical History  Diagnosis Date  . Back pain   . GERD (gastroesophageal reflux disease)   . MI (myocardial infarction)   . Hypercholesteremia   . Diabetes mellitus   . Anemia   . Cirrhosis   . Aortic sclerosis 07/04/2011  . Aortic insufficiency 07/04/2011    most recent 2D Echo 02/24/2012  EF greater than 55%  . Carotid stenosis, bilateral 07/04/2011    most recent 05/2010 carotid doppler  . OSA (obstructive sleep apnea) 07/04/2011    sleep study 2009  AHI 9.91/hr and during REM sleep 35.58/hr  . Presence of stent in right  coronary artery 07/04/2011  . HTN (hypertension) 07/04/2011  . Coronary artery disease   . Myocardial infarction 1995  . GERD (gastroesophageal reflux disease)   . Non-alcoholic fatty liver disease     Past Surgical History  Procedure Laterality Date  . Back surgery  1985  . Tonsillectomy    . Appendectomy    . Dilation and curettage of uterus    . Colonoscopy    . Eye surgery  08    cataract surgery of the left eye  . Ercp    . Abdominal hysterectomy  1972  . Cardiac catheterization  1995.,2006,1997    stent to the proximal RCA after MI   . Myringotomy  2012    both ears  . Hardware removal Right 01/17/2013    Procedure: REMOVAL OF HARDWARE AND EXCISION ULNAR STYLOID RIGHT WRIST;  Surgeon: Tennis Must, MD;  Location: [place];  Service: Orthopedics;  Laterality: Right;  . Wrist surgery      rt wrist hardwear removal  . Esophagogastroduodenoscopy N/A 04/01/2013    Procedure: ESOPHAGOGASTRODUODENOSCOPY (EGD);  Surgeon: Rogene Houston, MD;  [facility];  Service: Endoscopy;  Laterality: N/A;  230-rescheduled to 8:30am Ann notified pt  . Esophageal banding N/A 04/01/2013    Procedure: ESOPHAGEAL BANDING;  Surgeon: Rogene Houston, MD;  [facility];  Service: Endoscopy;  Laterality: N/A;  . Esophagogastroduodenoscopy N/A 05/24/2013    Procedure: ESOPHAGOGASTRODUODENOSCOPY (EGD);  Surgeon:  Rogene Houston, MD;  [facility];  Service: Endoscopy;  Laterality: N/A;  730  . Esophageal banding N/A 05/24/2013    Procedure: ESOPHAGEAL BANDING;  Surgeon: Rogene Houston, MD;  [facility];  Service: Endoscopy;  Laterality: N/A;    FAMHx:  Family History  Problem Relation Age of Onset  . Diabetes Mother   . Heart failure Father     SOCHx:   reports that she quit smoking about 18 months ago. Her smoking use included Cigarettes. She smoked 0.25 packs per day. She does not have any smokeless tobacco history on file. She reports  that she does not drink alcohol or use illicit drugs.  ALLERGIES:  Allergies  Allergen Reactions  . Tape Rash    ADHESIVE TAPE    ROS: A comprehensive review of systems was negative except for: Respiratory: positive for dyspnea on exertion Cardiovascular: positive for fleeting electrical chest pain  HOME MEDS: Current Outpatient Prescriptions  Medication Sig Dispense Refill  . aspirin 81 MG tablet Take 81 mg by mouth daily.      . carvedilol (COREG) 6.25 MG tablet Take 6.25 mg by mouth 2 (two) times daily with a meal.      . ferrous sulfate 325 (65 FE) MG tablet Take 325 mg by mouth daily with breakfast.      . insulin glargine (LANTUS SOLOSTAR) 100 UNIT/ML injection Inject 72 Units into the skin at bedtime.       . insulin NPH-insulin regular (NOVOLIN 70/30) (70-30) 100 UNIT/ML injection Inject 72 Units into the skin 2 (two) times daily with a meal.       . isosorbide mononitrate (IMDUR) 30 MG 24 hr tablet Take 1 tablet (30 mg total) by mouth daily.  30 tablet  8  . omeprazole (PRILOSEC OTC) 20 MG tablet Take 20 mg by mouth daily.      . promethazine (PHENERGAN) 25 MG tablet Take 25 mg by mouth every 6 (six) hours as needed for nausea.      . rosuvastatin (CRESTOR) 10 MG tablet Take 1 tablet (10 mg total) by mouth at bedtime.  30 tablet  6  . sucralfate (CARAFATE) 1 G tablet Take 1 tablet (1 g total) by mouth 4 (four) times daily.  120 tablet  0  . sulfamethoxazole-trimethoprim (BACTRIM DS) 800-160 MG per tablet Take 1 tablet by mouth daily.      . valsartan-hydrochlorothiazide (DIOVAN-HCT) 160-12.5 MG per tablet Take 1 tablet by mouth daily.      Marland Kitchen zolpidem (AMBIEN) 5 MG tablet Take 5 mg by mouth at bedtime.       No current facility-administered medications for this visit.    LABS/IMAGING: No results found for this or any previous visit (from the past 48 hour(s)). No results found.  VITALS: BP 120/60  Pulse 80  Ht 5' 3"  (1.6 m)  Wt 205 lb 14.4 oz (93.396 kg)  BMI 36.48  kg/m2  EXAM: General appearance: alert and no distress Neck: no JVD and bilateral carotid bruits, R>L Lungs: clear to auscultation bilaterally Heart: regular rate and rhythm, S1, S2 normal, no murmur, click, rub or gallop Abdomen: soft, non-tender; bowel sounds normal; no masses,  no organomegaly and obese Extremities: extremities normal, atraumatic, no cyanosis or edema Pulses: 2+ and symmetric Skin: Skin color, texture, turgor normal. No rashes or lesions Neurologic: Grossly normal Psych: Somewhat anxious  EKG: Sinus rhythm at 80  ASSESSMENT: 1. Coronary disease status post MI 1995 and 2 stents  the right coronary 2. Mild bilateral carotid artery disease 3. Mild aortic insufficiency with aortic sclerosis 4. PACs 5. Insulin-dependent diabetes 6. Dyslipidemia  PLAN: 1.   Mrs. Bear has more notable bilateral carotid bruits on exam today. The past she's had mild to moderate bilateral carotid artery disease but has not been interrogated in several years. I'm concerned about progression of her carotid disease and I would recommend repeat carotid Dopplers. In addition we should recheck a lipid profile and make sure she is adequately treated. She will probably need improvement in her diabetes medications as her fasting blood glucoses are in the 120 range. She is working on weight loss and has managed to lose about 5 pounds. I commended her on this and we continued to like to see her regularly. Plan to see her back in 6 months and I will contact her with the results of her laboratory work and Doppler studies.  Pixie Casino, MD, [facility] Attending Cardiologist [facility]  Saim Almanza C 07/20/2013, 2:33 PM

## 2013-07-20 NOTE — Patient Instructions (Signed)
Your physician has requested that you have a carotid duplex. This test is an ultrasound of the carotid arteries in your neck. It looks at blood flow through these arteries that supply the brain with blood. Allow one hour for this exam. There are no restrictions or special instructions.  Your physician recommends that you return for lab work in a week or so. You will need to be fasting - we will check your cholesterol levels.  Your physician wants you to follow-up in: 6 months. You will receive a reminder letter in the mail two months in advance. If you don't receive a letter, please call our office to schedule the follow-up appointment.

## 2013-07-25 LAB — NMR LIPOPROFILE WITH LIPIDS
HDL Size: 8.9 nm — ABNORMAL LOW (ref >=9.2)
HDL-C: 41 mg/dL (ref >=40)
LDL (calc): 181 mg/dL — ABNORMAL HIGH (ref <100)
LDL Particle Number: 2747 nmol/L — ABNORMAL HIGH (ref <1000)
LDL Size: 20.2 nm — ABNORMAL LOW (ref >20.5)
LP-IR Score: 64 — ABNORMAL HIGH (ref <=45)
Small LDL Particle Number: 1749 nmol/L — ABNORMAL HIGH (ref <=527)
VLDL Size: 46.6 nm (ref <=46.6)

## 2013-07-26 ENCOUNTER — Ambulatory Visit (HOSPITAL_COMMUNITY)
Admission: RE | Admit: 2013-07-26 | Discharge: 2013-07-26 | Disposition: A | Discharge status: Home / Self Care | Payer: Medicare Other | Source: Ambulatory Visit | Attending: Internal Medicine | Admitting: Internal Medicine

## 2013-07-26 DIAGNOSIS — R0989 Other specified symptoms and signs involving the circulatory and respiratory systems: Secondary | ICD-10-CM | POA: Insufficient documentation

## 2013-07-26 NOTE — Progress Notes (Signed)
Carotid Duplex Completed. °Brianna L Mazza,RVT °

## 2013-07-29 ENCOUNTER — Telehealth: Payer: Self-pay | Admitting: *Deleted

## 2013-07-29 DIAGNOSIS — E7800 Pure hypercholesterolemia, unspecified: Secondary | ICD-10-CM

## 2013-07-29 MED ORDER — ROSUVASTATIN CALCIUM 40 MG PO TABS: 40.0000 mg | ORAL_TABLET | Freq: Every day | ORAL | Status: DC

## 2013-07-29 NOTE — Telephone Encounter (Signed)
Message copied by Fidel Levy on Fri Jul 29, 2013  4:50 PM ------      Message from: Pixie Casino      Created: Thu Jul 28, 2013  9:55 AM       Please notify patient that the carotid ultrasound test results shows slightly worse blood flow on the left and in the left subclavian artery as well.  We need to be more aggressive with cholesterol, as I have already indicated based on her cholesterol tests. Hopefully, this will slow the progression of plaque buildup.            -Dr. Debara Pickett       ------

## 2013-07-29 NOTE — Telephone Encounter (Signed)
Called patient with NMR & carotid doppler results. Informed of need to increase crestor to 3m daily. Patient agreed with plan & verbalized understanding.

## 2013-07-29 NOTE — Telephone Encounter (Signed)
Message copied by Fidel Levy on Fri Jul 29, 2013  4:46 PM ------      Message from: Pixie Casino      Created: Thu Jul 28, 2013  9:10 AM       Cholesterol is too high.  Given her carotid artery disease, I would recommend increasing crestor to 40 mg daily.  Please let her know.            Dr. Debara Pickett ------

## 2013-08-15 ENCOUNTER — Encounter (INDEPENDENT_AMBULATORY_CARE_PROVIDER_SITE_OTHER): Payer: Self-pay | Admitting: Internal Medicine

## 2013-08-15 ENCOUNTER — Telehealth (INDEPENDENT_AMBULATORY_CARE_PROVIDER_SITE_OTHER): Payer: Self-pay | Admitting: *Deleted

## 2013-08-15 ENCOUNTER — Ambulatory Visit (INDEPENDENT_AMBULATORY_CARE_PROVIDER_SITE_OTHER): Payer: Medicare Other | Admitting: Internal Medicine

## 2013-08-15 VITALS — BP 140/80 | HR 76 | Temp 98.1°F | Resp 18 | Ht 63.0 in | Wt 197.7 lb

## 2013-08-15 DIAGNOSIS — D649 Anemia, unspecified: Secondary | ICD-10-CM

## 2013-08-15 DIAGNOSIS — K7460 Unspecified cirrhosis of liver: Secondary | ICD-10-CM

## 2013-08-15 DIAGNOSIS — M79609 Pain in unspecified limb: Secondary | ICD-10-CM

## 2013-08-15 DIAGNOSIS — Z8719 Personal history of other diseases of the digestive system: Secondary | ICD-10-CM

## 2013-08-15 LAB — CBC
MCH: 30.5 pg (ref 26.0–34.0)
MCHC: 34.7 g/dL (ref 30.0–36.0)
MCV: 87.9 fL (ref 78.0–100.0)
Platelets: 169 10*3/uL (ref 150–400)

## 2013-08-15 MED ORDER — IBUPROFEN 200 MG PO TABS: 200.0000 mg | ORAL_TABLET | Freq: Two times a day (BID) | ORAL | Status: DC | PRN

## 2013-08-15 NOTE — Patient Instructions (Signed)
Can take Advil 200-400 mg by mouth once or twice daily as needed for pain involving right hand. Physician will contact you  with results of blood work. Ultrasound of liver in January or February 2015. Next EGD and variceal banding in March 2015

## 2013-08-15 NOTE — Progress Notes (Signed)
Presenting complaint;  Follow for cirrhosis secondary to NAFLD. History of esophageal variceal bleed.  Database; Patient is 72 year old Caucasian female who has history of cirrhosis secondary to NAFLD diagnosed by liver biopsy in April 2008 revealing stage IV fibrosis and steatosis. She presented in July this year with history of hematemesis after having not been seen for about 4 years. She had EGD on 04/01/2013 revealing 3 columns of grade 3 esophageal varices and one with stigmata of bleed. All 3 varices were banded. She also had portal gastropathy. On day of procedure her H&H was 7.5 and 23 and she was given 2 units of PRBCs. She had followup EGD on 05/24/2013 when to call in for banded.  Subjective:  Patient is here for scheduled visit. She feels better. She walks for at least 15 minutes on a stepper every day. She has lost close to 11 pounds since her last visit. She denies heartburn dysphagia abdominal pain melena or rectal bleeding. She she now feels less fatigued. She has not had any problems with hypoglycemia. She states her fasting glucose still runs about 120. She complains of chronic pain in her right hand and is taking naproxen 220 milligrams twice daily. She states she has been taking this dose for about 2 years.    Current Medications: Current Outpatient Prescriptions  Medication Sig Dispense Refill  . aspirin 81 MG tablet Take 81 mg by mouth daily.      . carvedilol (COREG) 6.25 MG tablet Take 6.25 mg by mouth 2 (two) times daily with a meal.      . ferrous sulfate 325 (65 FE) MG tablet Take 325 mg by mouth daily with breakfast.      . insulin glargine (LANTUS SOLOSTAR) 100 UNIT/ML injection Inject 72 Units into the skin at bedtime.       . insulin NPH-insulin regular (NOVOLIN 70/30) (70-30) 100 UNIT/ML injection Inject 72 Units into the skin 2 (two) times daily with a meal.       . isosorbide mononitrate (IMDUR) 30 MG 24 hr tablet Take 1 tablet (30 mg total) by mouth daily.   30 tablet  8  . naproxen sodium (ANAPROX) 220 MG tablet Take 220 mg by mouth 2 (two) times daily with a meal.      . omeprazole (PRILOSEC OTC) 20 MG tablet Take 20 mg by mouth daily.      . rosuvastatin (CRESTOR) 40 MG tablet Take 1 tablet (40 mg total) by mouth at bedtime.  30 tablet  9  . sucralfate (CARAFATE) 1 G tablet Take 1 tablet (1 g total) by mouth 4 (four) times daily.  120 tablet  0  . sulfamethoxazole-trimethoprim (BACTRIM DS) 800-160 MG per tablet Take 1 tablet by mouth daily.      . valsartan-hydrochlorothiazide (DIOVAN-HCT) 160-12.5 MG per tablet Take 1 tablet by mouth daily.       No current facility-administered medications for this visit.  Diphenhydramine 50 mg by mouth each bedtime when necessary.   Objective: Blood pressure 140/80, pulse 76, temperature 98.1 F (36.7 C), temperature source Oral, resp. rate 18, height 5' 3"  (1.6 m), weight 197 lb 11.2 oz (89.676 kg). Patient is alert and in no acute distress. She does not have asterixis. Conjunctiva is pink. Sclera is nonicteric Oropharyngeal mucosa is normal. No neck masses or thyromegaly noted. Cardiac exam with regular rhythm normal S1 and S2. No murmur or gallop noted. Lungs are clear to auscultation. Abdomen is full but soft and nontender. Spleen is not palpable.  Left lobe of the liver is easily palpable and is firm and nontender. Shifting dullness is absent.  No LE edema or clubbing noted. She has limited extension at right wrist.  Labs/studies Results: CBC from 05/24/2013. VBC 5.0, H&H 9.7 and 30.3 and platelet count 120K. Last ultrasound was on 04/04/2013 revealing echogenic liver with lobular contour but no focal abnormalities. Spleen measures 13.3 cm AFP was 5.9 on 03/29/2013.   Assessment:  #1. Anemia. Anemia would appear to be multifactorial i.e. secondary to GI bleed and chronic disease. H&H has been coming up slowly. #2. History of esophageal variceal bleed. Status post banding in July and  September of this year. She is on carvedilol and therefore will not switch her to propranolol or nadolol. #3. Cirrhosis secondary to NAFLD. Patient has lost over 10 pounds. In order to slow down progression or even reverse the disease she needs to keep exercising and and keep losing weight. #4. Thrombocytopenia secondary to chronic liver disease and splenomegaly. #5. Chronic pain involving the right hand. Patient advised to stop using Aleve and use low-dose ibuprofen on an as-needed basis. There will be less risk of GI injury associated with ibuprofen causes short acting and less potent.   Plan: Discontinue Aleve. Ibuprofen 200 mg to 400 mg by mouth twice a day when necessary. CBC. Hepatic ultrasound in January 2015 for [facility] screening. EGD with banding if indicated in March 2015. Office visit in 6 months.

## 2013-08-15 NOTE — Telephone Encounter (Signed)
Please let [name] know is is taking dipherhydramine hydrochloride 50 mg for sleep. She was to call back with the name of this medicine. Her return phone number is  [phone].

## 2013-08-15 NOTE — Telephone Encounter (Signed)
Noted  

## 2013-09-09 IMAGING — MG MM DIGITAL SCREENING BILAT W/ CAD
5 series · 5 of 5 positions shown · non-contrast
Comparison: Previous exams.

CLINICAL DATA: Screening.

DIGITAL SCREENING BILATERAL MAMMOGRAM WITH CAD

[L CC]
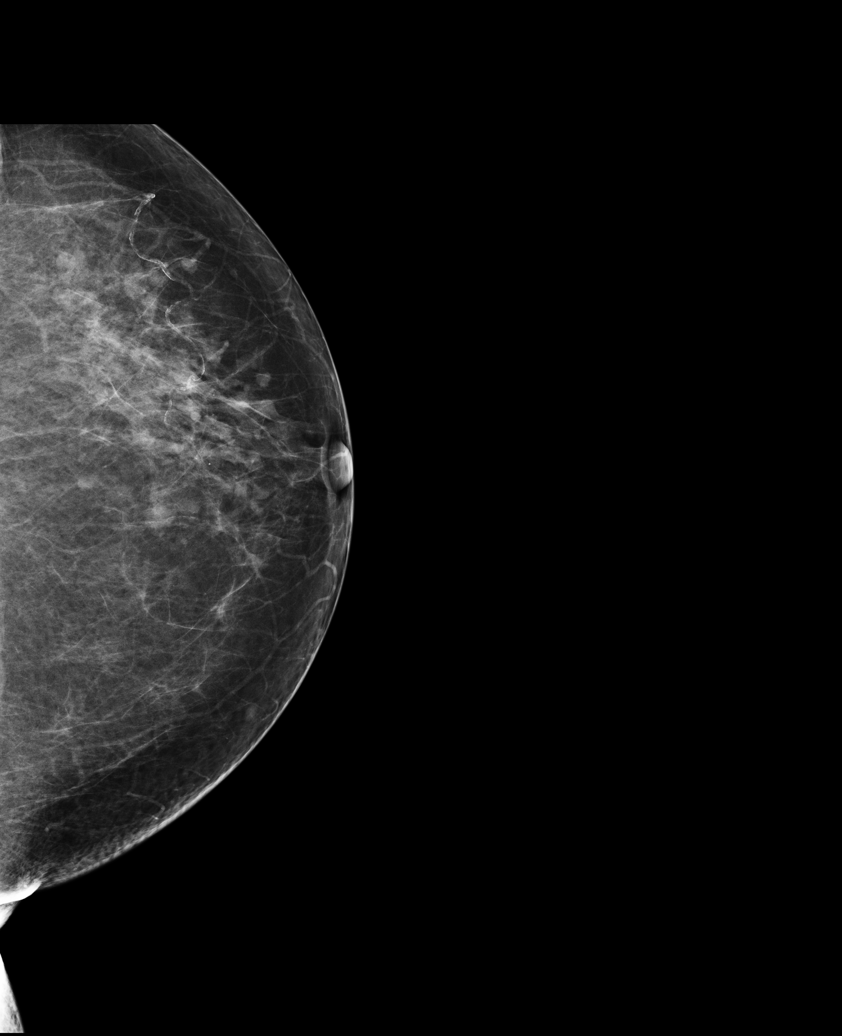

[L MLO]
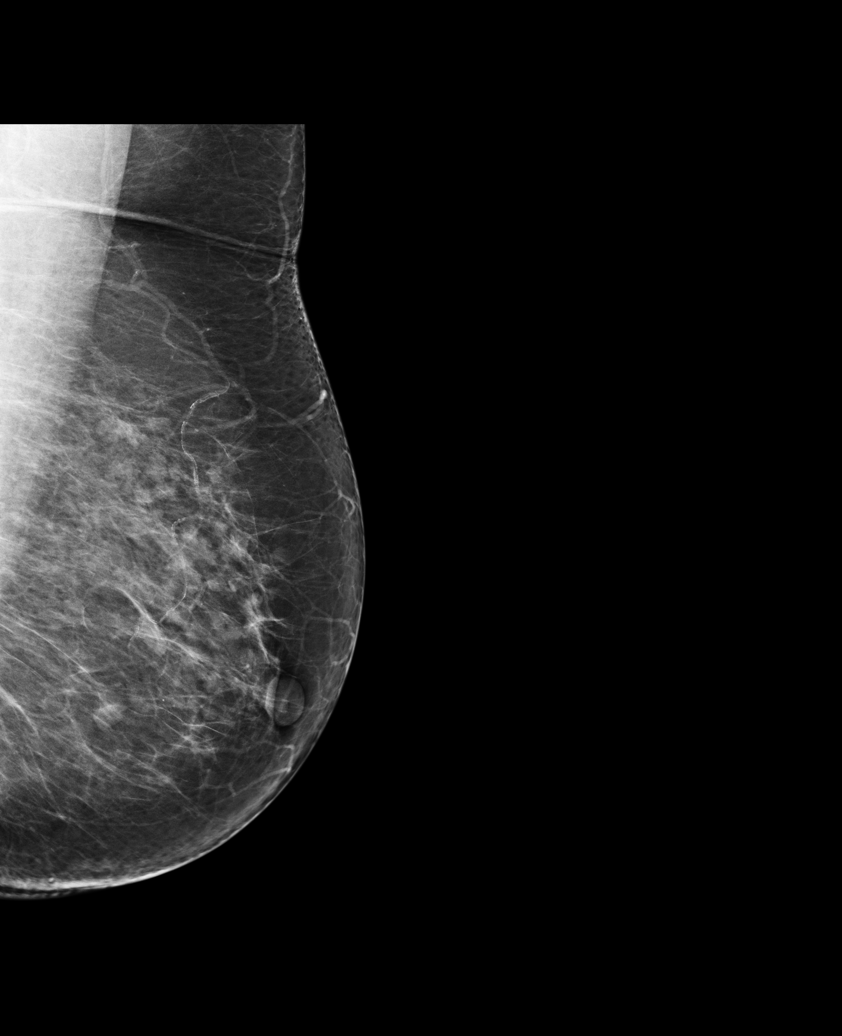

[R CC]
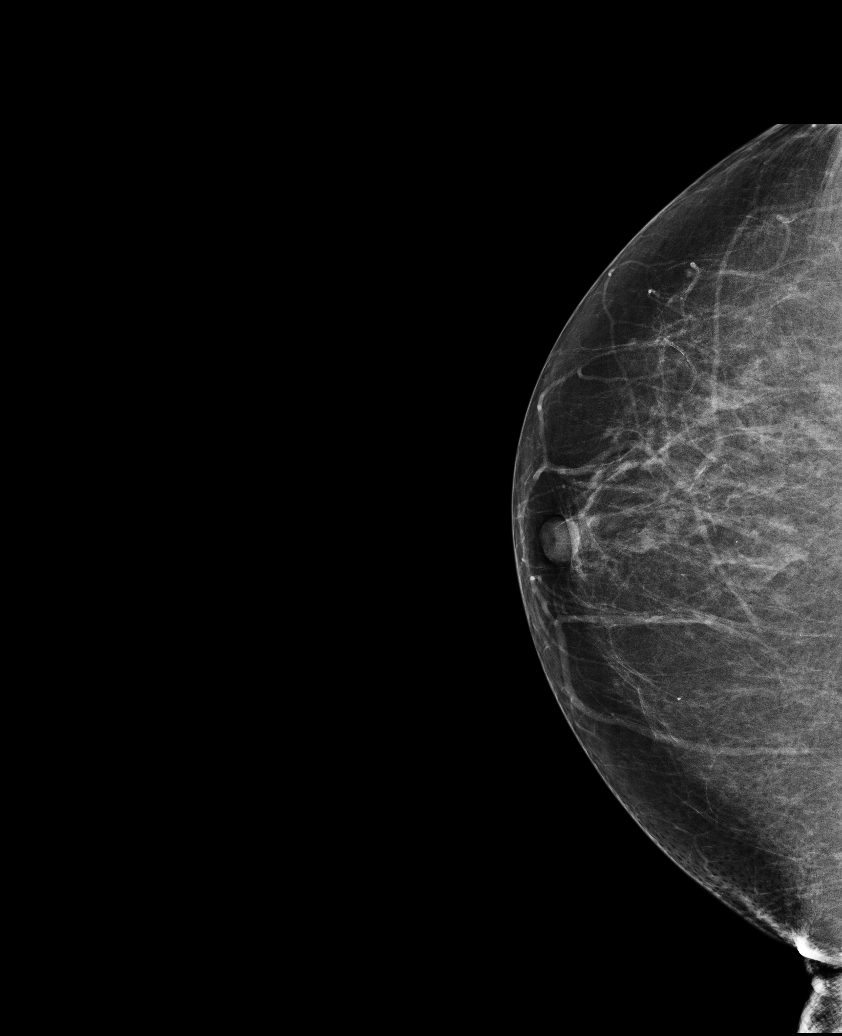

[R MLO (1 of 2)]
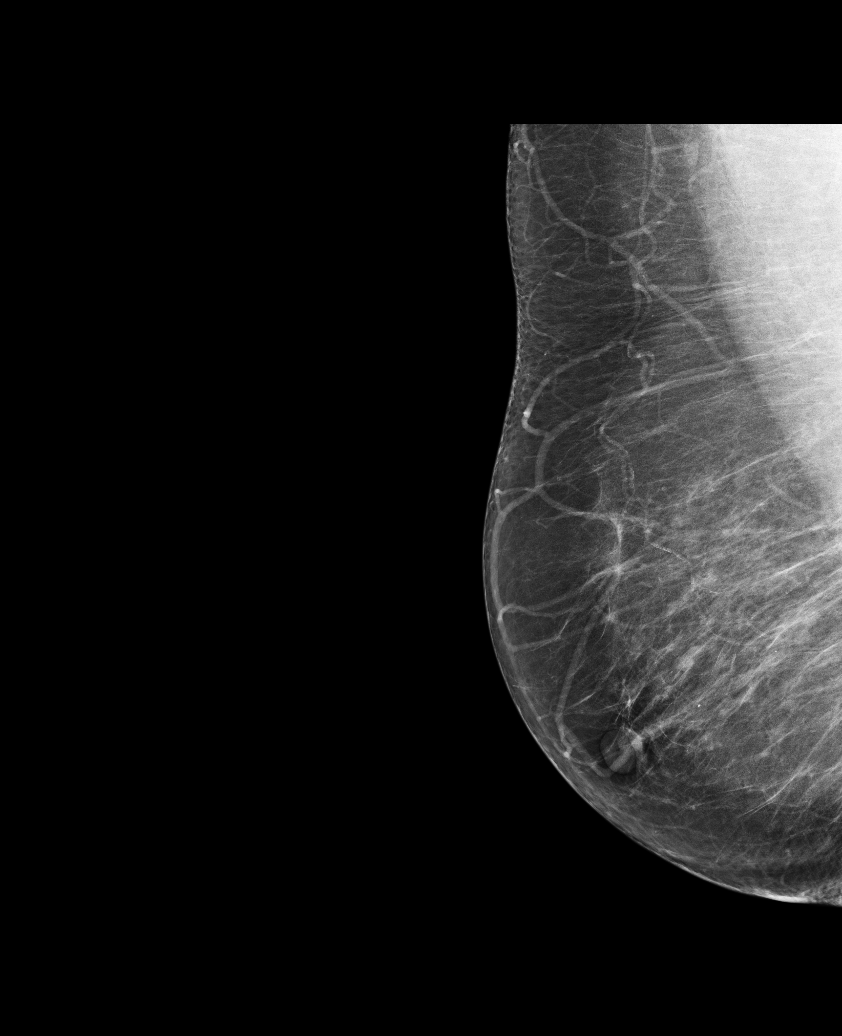

[R MLO (2 of 2)]
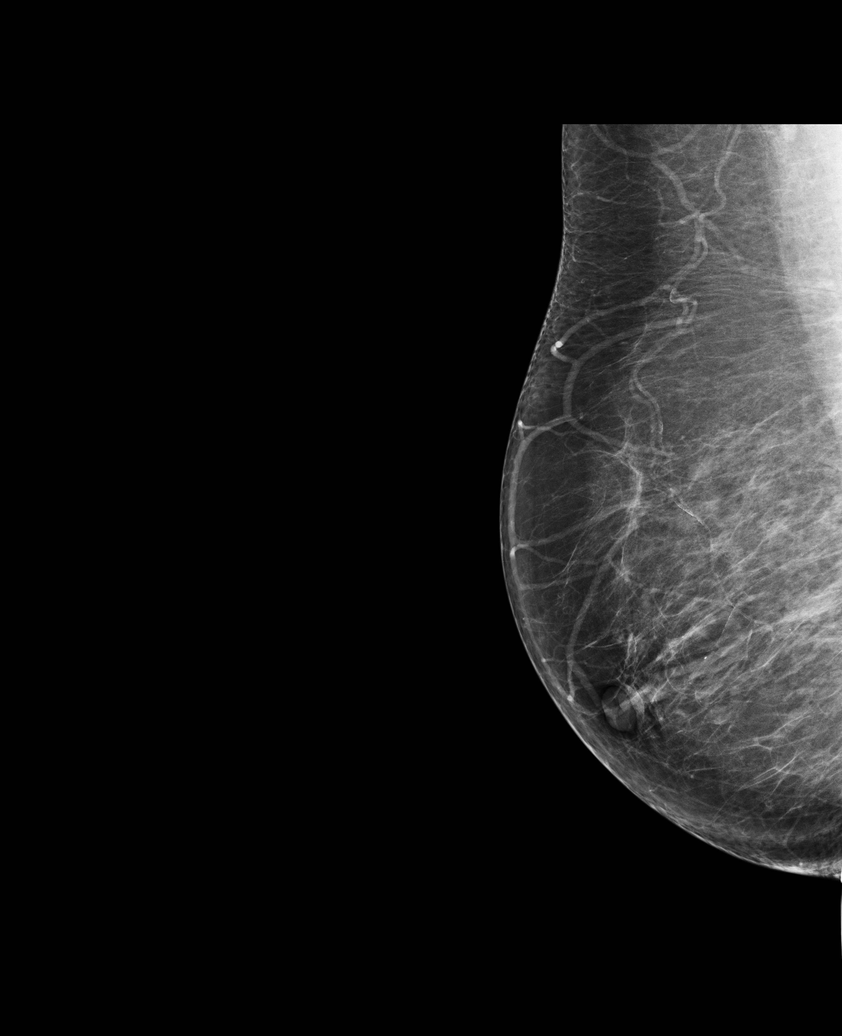

[5 of 5 positions shown; findings below may reference images not displayed]

FINDINGS: ACR Breast Density Category 2: There is a scattered fibroglandular
pattern.

There are no findings suspicious for malignancy.

Images were processed with CAD.
IMPRESSION: No mammographic evidence of malignancy.

A result letter of this screening mammogram will be mailed directly
to the patient.

RECOMMENDATION:
Screening mammogram in one year. (Code:C0-M-1VF)

BI-RADS CATEGORY 1:  Negative.

## 2013-09-13 ENCOUNTER — Encounter (INDEPENDENT_AMBULATORY_CARE_PROVIDER_SITE_OTHER): Payer: Self-pay | Admitting: *Deleted

## 2013-09-29 ENCOUNTER — Encounter (INDEPENDENT_AMBULATORY_CARE_PROVIDER_SITE_OTHER): Payer: Self-pay | Admitting: *Deleted

## 2013-09-29 ENCOUNTER — Other Ambulatory Visit (INDEPENDENT_AMBULATORY_CARE_PROVIDER_SITE_OTHER): Payer: Self-pay | Admitting: *Deleted

## 2013-09-29 DIAGNOSIS — I8500 Esophageal varices without bleeding: Secondary | ICD-10-CM

## 2013-10-21 IMAGING — CR DG ABDOMEN ACUTE W/ 1V CHEST
3 series · 3 of 3 positions shown · non-contrast
Comparison: 03/25/2013

CLINICAL DATA: Vomiting blood.  Nausea.

ACUTE ABDOMEN SERIES (ABDOMEN 2 VIEW & CHEST 1 VIEW)

[view not recorded (1 of 3)]
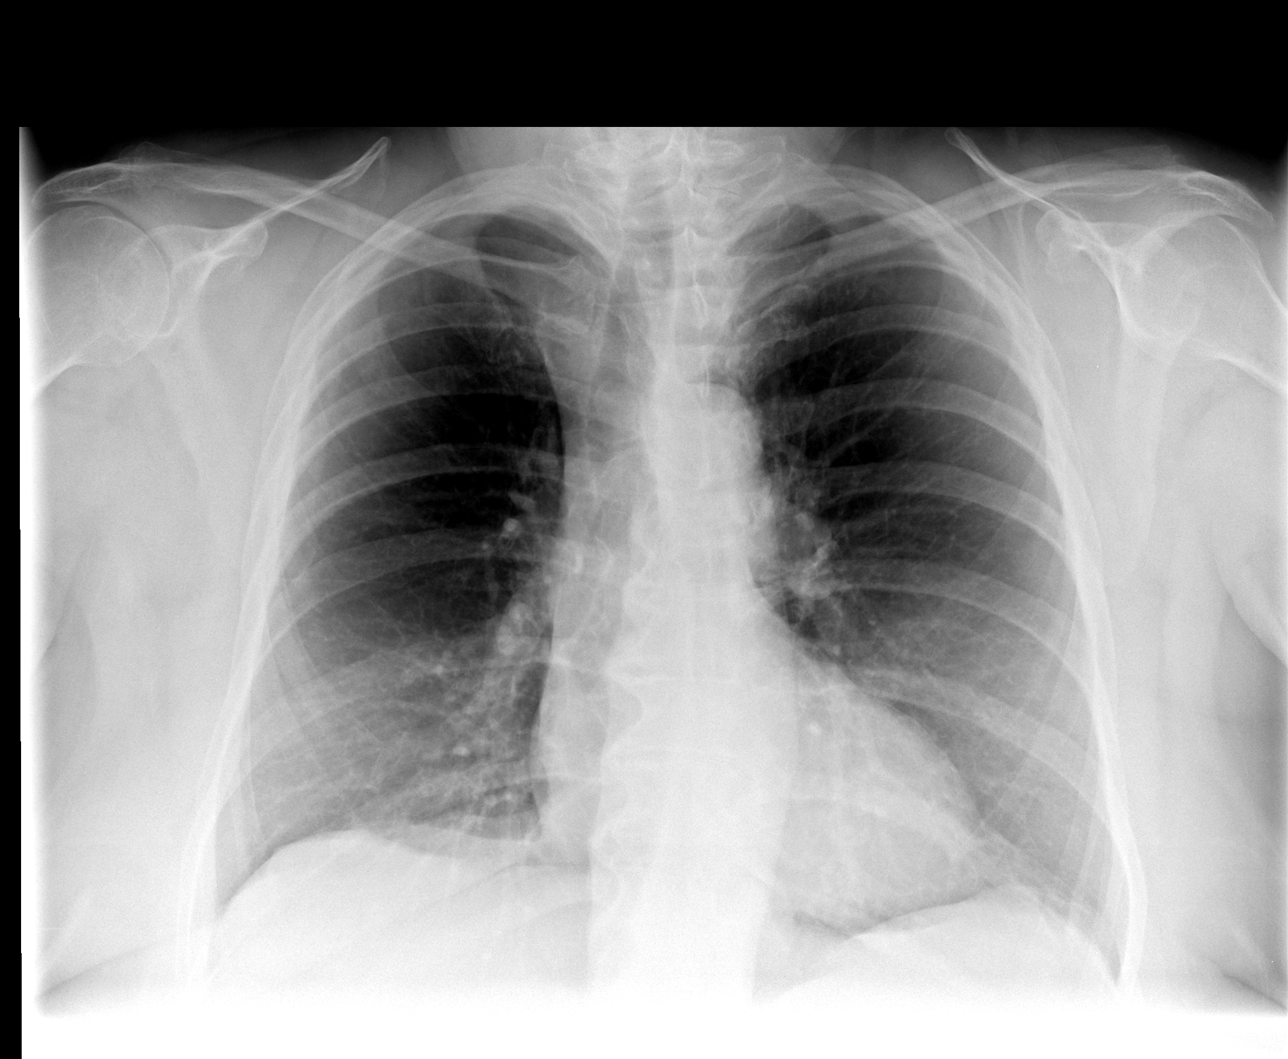

[view not recorded (2 of 3)]
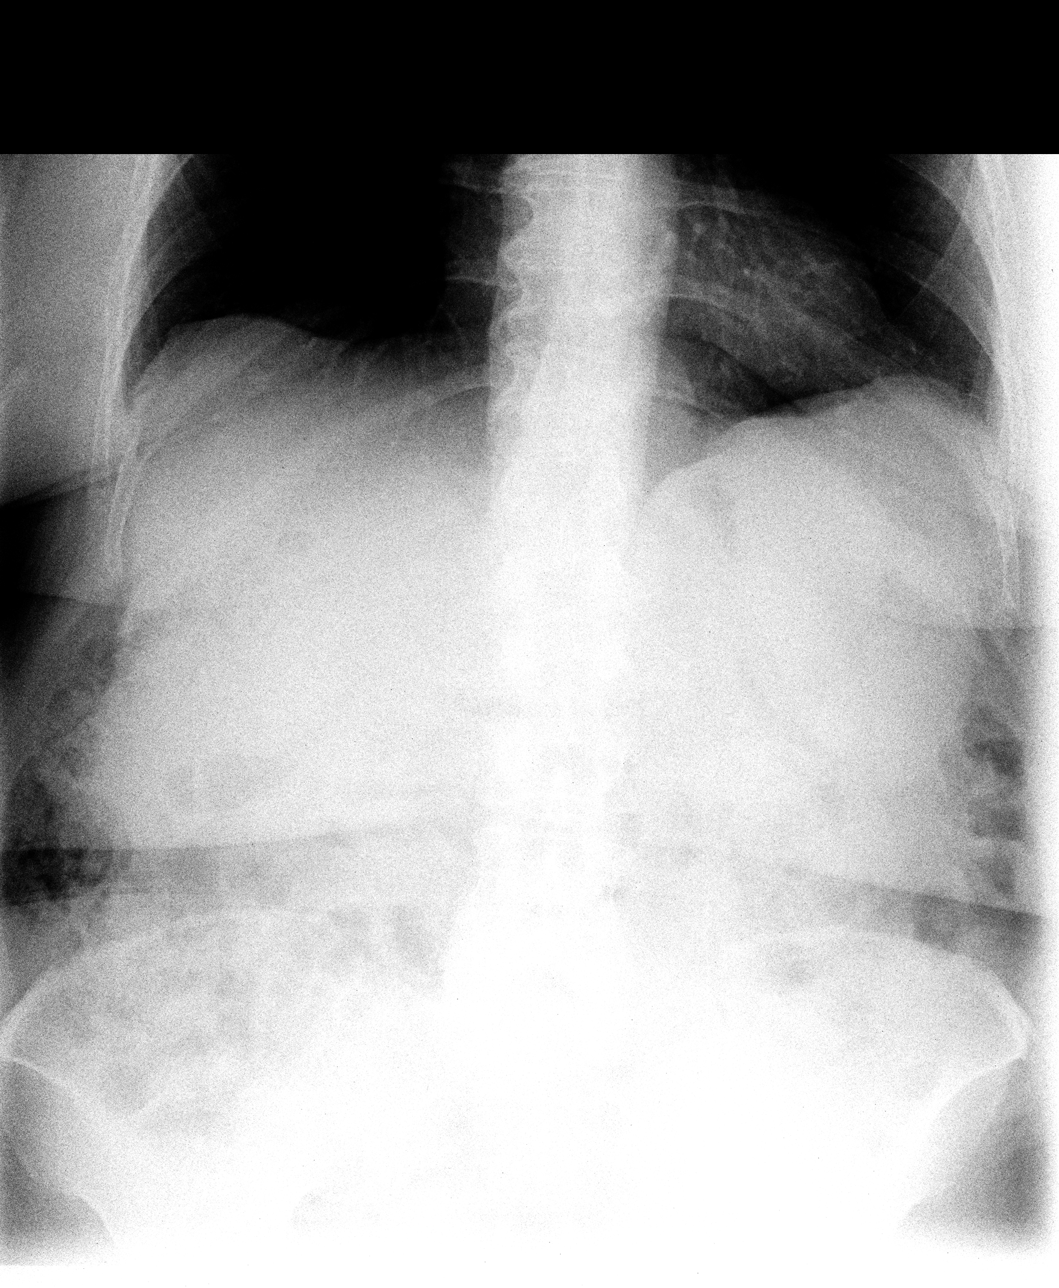

[view not recorded (3 of 3)]
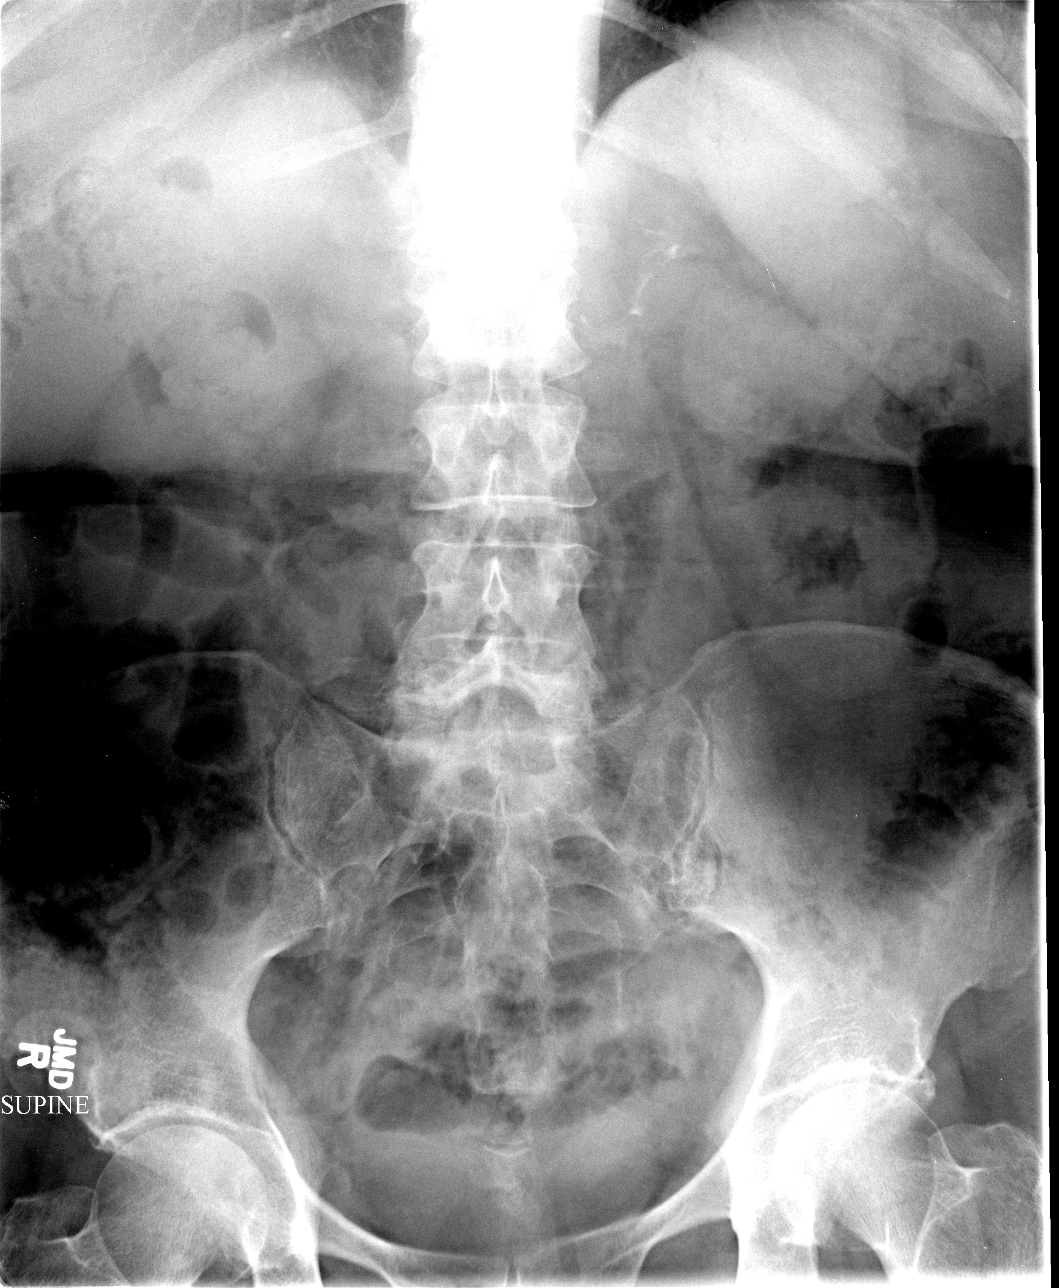

[3 of 3 positions shown; findings below may reference images not displayed]

FINDINGS: Slight fibrosis or linear atelectasis in the lung bases.
The heart size and pulmonary vascularity are normal. The lungs
appear clear and expanded without focal air space disease or
consolidation. No blunting of the costophrenic angles.  No
pneumothorax.  Mediastinal contours appear intact.

Scattered gas and stool in the colon with scattered gas filled
small bowel loops.  No small or large bowel distension.  Suggestion
of wall thickening in the descending colon which may reflect
colitis.  No free intra-abdominal air.  No abnormal air fluid
levels.  Vascular calcifications.  Mild degenerative changes in the
spine and shoulders.
IMPRESSION: No evidence of active pulmonary disease.  Nonobstructive bowel gas
pattern.  Suggestion of wall thickening in the descending colon
which may reflect colitis.

## 2013-10-26 IMAGING — US US ABDOMEN COMPLETE
1 series · 14 of 25 positions shown · non-contrast
Comparison: Ultrasound 12/25/2006

CLINICAL DATA: Cirrhosis follow-up

COMPLETE ABDOMINAL ULTRASOUND

[Series 1: us abdomen complete · 0.23mm/px · 14 of 109 slices shown]
[im 1/109]
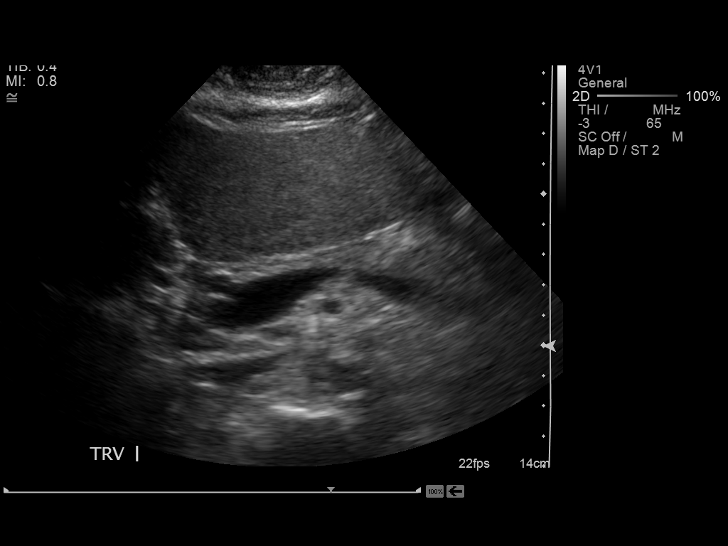
[im 10/109]
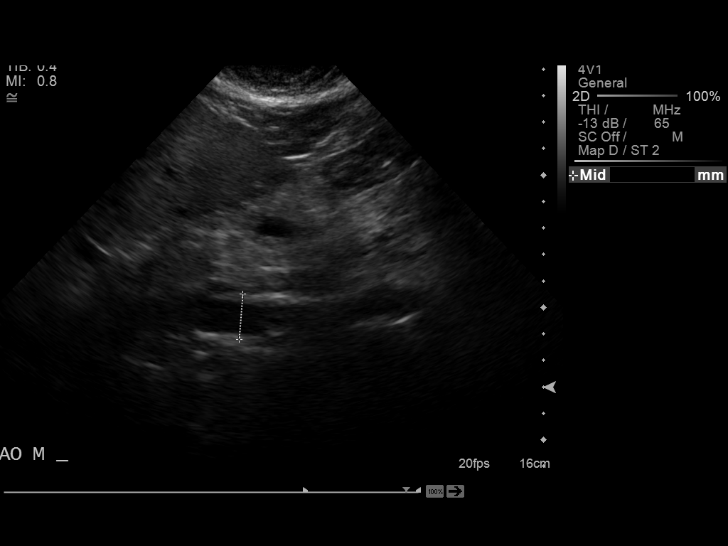
[im 19/109]
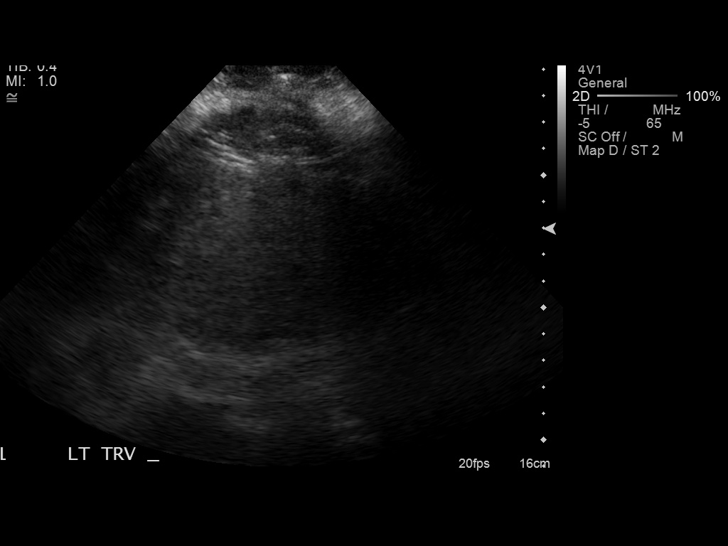
[im 28/109]
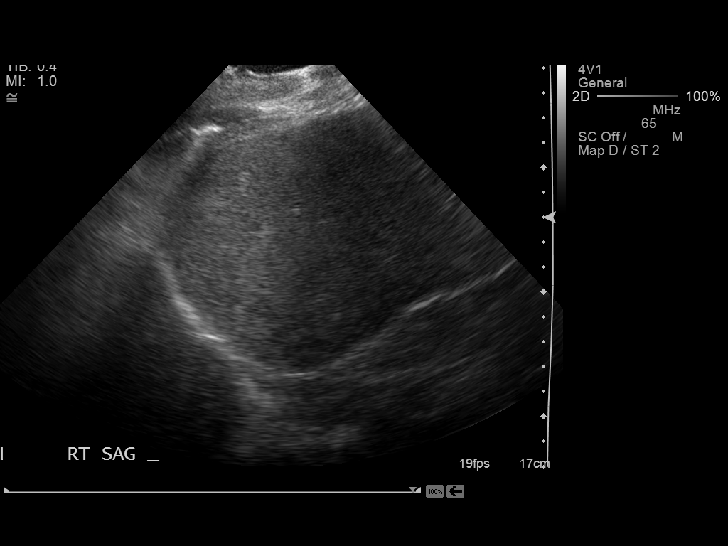
[im 37/109]
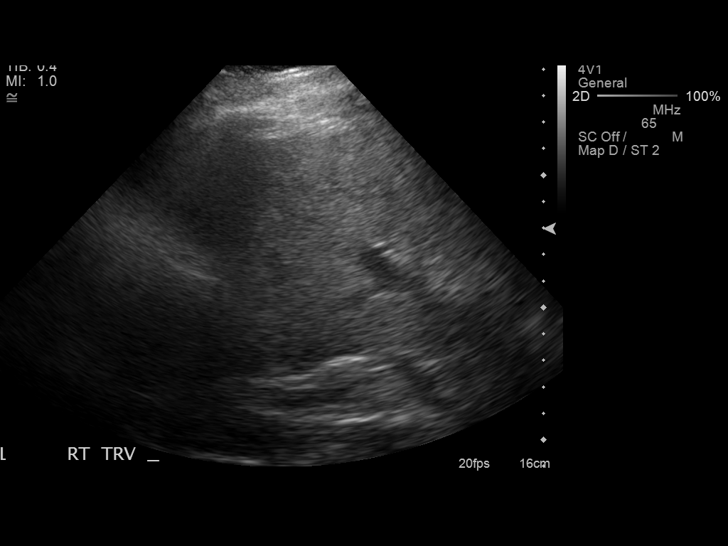
[im 41/109]
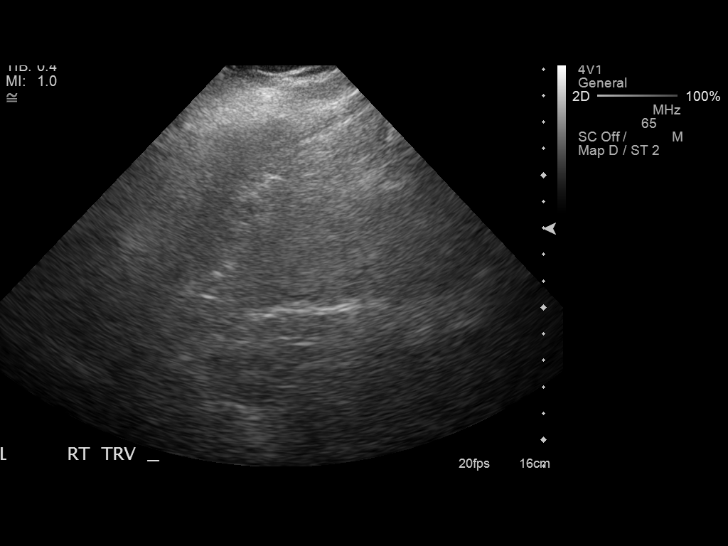
[im 50/109]
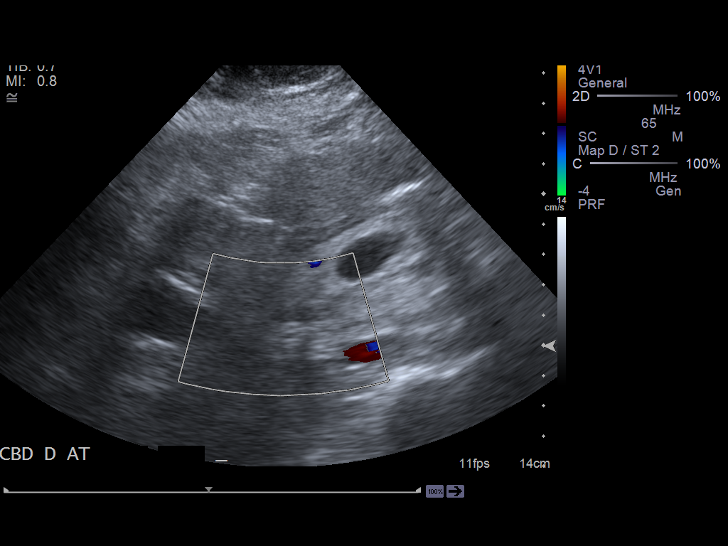
[im 59/109]
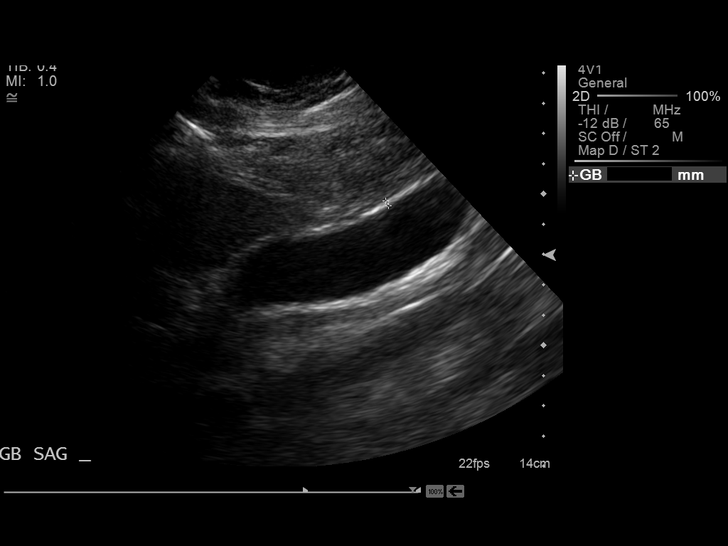
[im 68/109]
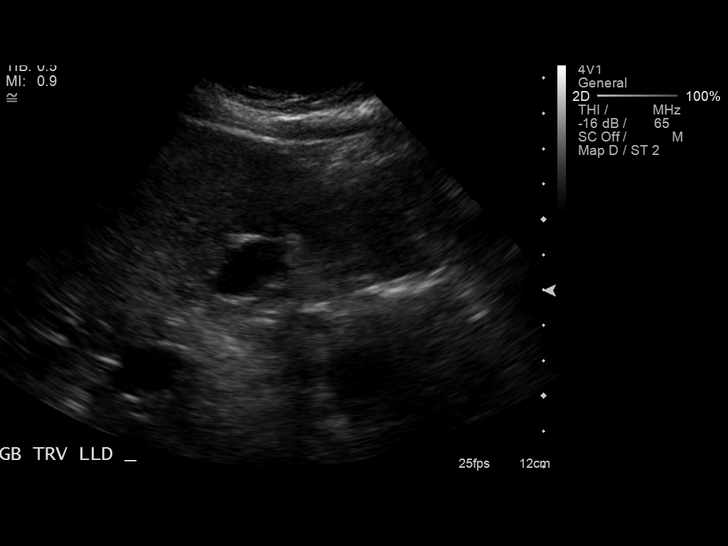
[im 73/109]
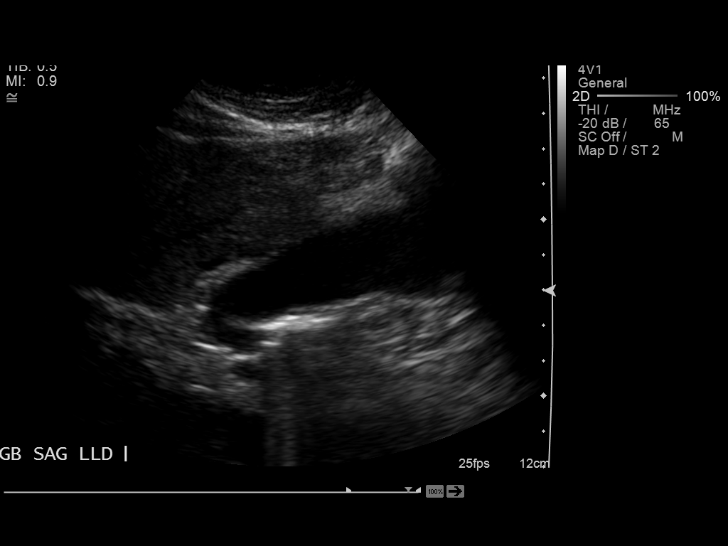
[im 82/109]
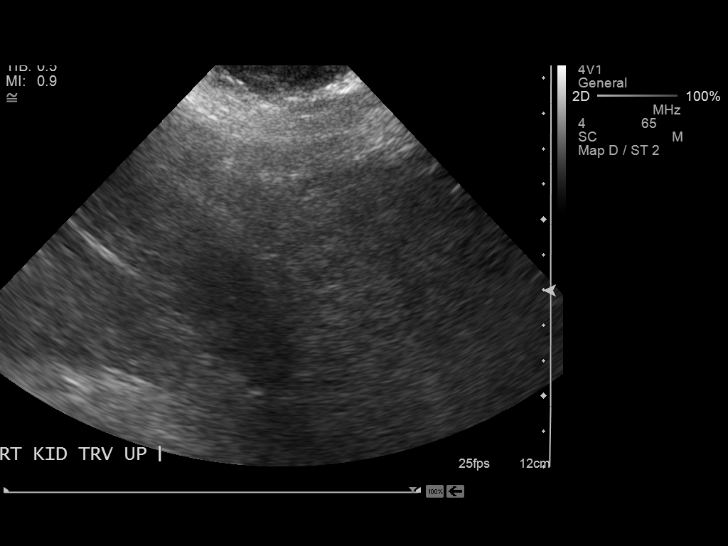
[im 91/109]
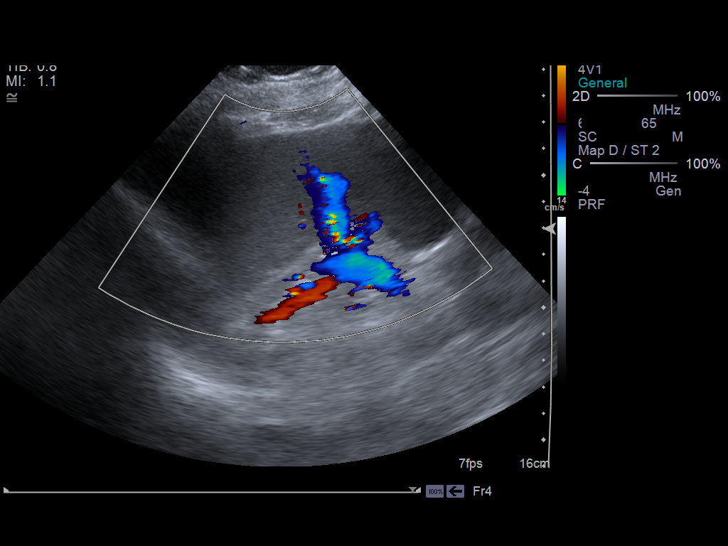
[im 100/109]
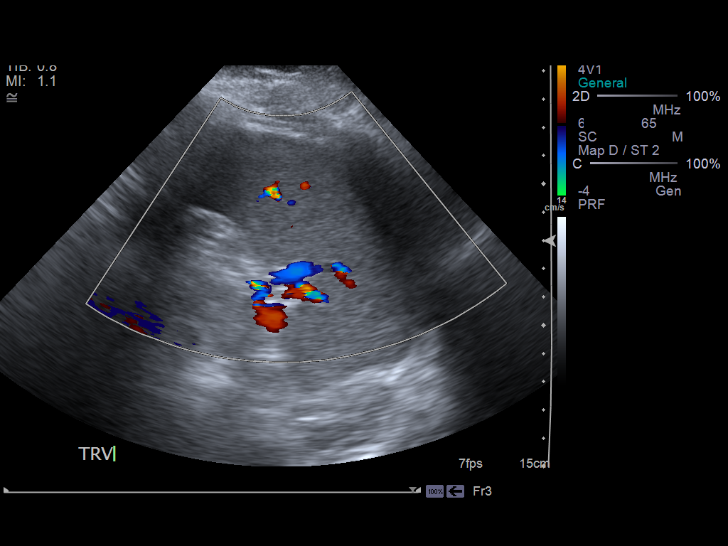
[im 109/109]
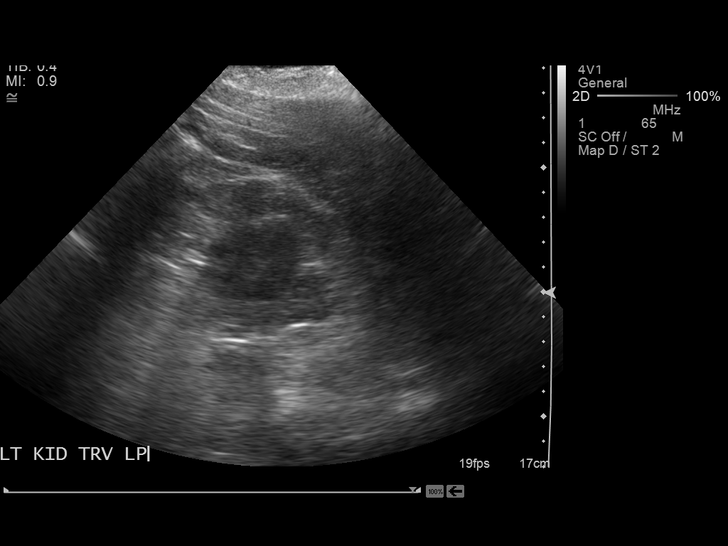

[14 of 25 positions shown; findings below may reference images not displayed]

FINDINGS: Gallbladder:  No gallstones, gallbladder wall thickening, or
pericholecystic fluid.

Common bile duct:  Normal diameter at 5 mm.

Liver:  The liver is diffusely increased in echogenicity.  There is
a lobular contour.  No focal lesions identified no ductal
dilatation.

IVC:  Appears normal.

Pancreas:  No focal abnormality seen.

Spleen:  The spleen is upper limits of normal at 13 cm.

Right Kidney:  13.3cm in length.  No evidence of hydronephrosis or
stones.

Left Kidney:  14.9cm in length.  No evidence of hydronephrosis or
stones.

Abdominal aorta:  No aneurysm identified.
IMPRESSION: 1..  Echogenic liver parenchyma with lobular contour is consistent
the patient's history of cirrhosis.  No focal hepatic lesion by
ultrasound examination.
2. Potential splenomegaly.

## 2013-10-31 ENCOUNTER — Other Ambulatory Visit: Payer: Self-pay | Admitting: Internal Medicine

## 2013-11-12 ENCOUNTER — Other Ambulatory Visit: Payer: Self-pay | Admitting: Internal Medicine

## 2013-11-14 NOTE — Telephone Encounter (Signed)
Rx was sent to pharmacy electronically. 

## 2013-12-07 LAB — CBC
HCT: 38.2 % (ref 36.0–46.0)
HEMOGLOBIN: 13 g/dL (ref 12.0–15.0)
MCH: 30.4 pg (ref 26.0–34.0)
MCHC: 34 g/dL (ref 30.0–36.0)
MCV: 89.5 fL (ref 78.0–100.0)
Platelets: 157 10*3/uL (ref 150–400)
RBC: 4.27 MIL/uL (ref 3.87–5.11)
RDW: 13.8 % (ref 11.5–15.5)
WBC: 8.1 10*3/uL (ref 4.0–10.5)

## 2013-12-08 LAB — NMR LIPOPROFILE WITH LIPIDS
Cholesterol, Total: 142 mg/dL (ref <200)
HDL Particle Number: 29.8 umol/L — ABNORMAL LOW (ref >=30.5)
HDL Size: 9.3 nm (ref >=9.2)
HDL-C: 47 mg/dL (ref >=40)
LDL (calc): 75 mg/dL (ref <100)
LDL Particle Number: 1129 nmol/L — ABNORMAL HIGH (ref <1000)
LDL Size: 20.4 nm — ABNORMAL LOW (ref >20.5)
LP-IR Score: 49 — ABNORMAL HIGH (ref <=45)
Large HDL-P: 7.7 umol/L (ref >=4.8)
Large VLDL-P: 2.5 nmol/L (ref <=2.7)
Small LDL Particle Number: 617 nmol/L — ABNORMAL HIGH (ref <=527)
Triglycerides: 98 mg/dL (ref <150)
VLDL Size: 54.9 nm — ABNORMAL HIGH (ref <=46.6)

## 2013-12-09 ENCOUNTER — Encounter: Payer: Self-pay | Admitting: Cardiology

## 2013-12-09 ENCOUNTER — Ambulatory Visit (INDEPENDENT_AMBULATORY_CARE_PROVIDER_SITE_OTHER): Payer: Medicare Other | Admitting: Cardiology

## 2013-12-09 VITALS — BP 150/62 | HR 84 | Ht 63.0 in | Wt 201.0 lb

## 2013-12-09 DIAGNOSIS — I10 Essential (primary) hypertension: Secondary | ICD-10-CM

## 2013-12-09 DIAGNOSIS — I471 Supraventricular tachycardia, unspecified: Secondary | ICD-10-CM

## 2013-12-09 DIAGNOSIS — IMO0001 Reserved for inherently not codable concepts without codable children: Secondary | ICD-10-CM

## 2013-12-09 DIAGNOSIS — E7800 Pure hypercholesterolemia, unspecified: Secondary | ICD-10-CM

## 2013-12-09 DIAGNOSIS — I700 Atherosclerosis of aorta: Secondary | ICD-10-CM

## 2013-12-09 DIAGNOSIS — K7460 Unspecified cirrhosis of liver: Secondary | ICD-10-CM

## 2013-12-09 DIAGNOSIS — I658 Occlusion and stenosis of other precerebral arteries: Secondary | ICD-10-CM

## 2013-12-09 DIAGNOSIS — I6523 Occlusion and stenosis of bilateral carotid arteries: Secondary | ICD-10-CM

## 2013-12-09 DIAGNOSIS — I6529 Occlusion and stenosis of unspecified carotid artery: Secondary | ICD-10-CM

## 2013-12-09 DIAGNOSIS — I2510 Atherosclerotic heart disease of native coronary artery without angina pectoris: Secondary | ICD-10-CM

## 2013-12-09 DIAGNOSIS — E119 Type 2 diabetes mellitus without complications: Secondary | ICD-10-CM

## 2013-12-09 MED ORDER — CARVEDILOL 12.5 MG PO TABS: 12.5000 mg | ORAL_TABLET | Freq: Two times a day (BID) | ORAL | Status: DC

## 2013-12-09 NOTE — Assessment & Plan Note (Signed)
stable °

## 2013-12-09 NOTE — Progress Notes (Signed)
12/09/2013   PCP: Leonides Grills, MD   Chief Complaint  Patient presents with  . Follow-up    dizziness when standing    Primary Cardiologist:  Dr. Debara Pickett  HPI:  73 year old female with history of MI in 1995, two stents to the right coronary, mild carotid disease, obesity, aortic sclerosis, and mild AI. She had chest pain in the past, had a negative nuclear stress test 2012 and some tachypalpitations which stopped pretty abruptly. She wore a monitor for 30 days which showed basically infrequent PACs and no tachyarrhythmias. Dr. Debara Pickett was concerned about AFib or an SVT. In Nov. she had an episode of tachycardia, a heart rate of 160, very regular. She presented to the hospital for evaluation and the heart rate was normal when she arrived. I suspect, as she noted she thought this was a very regular fast heart rate, it may be an SVT.  Her last cath 2007 with minimal CAD with <10-20% in ramus intermedius, RCA 10% irregularity.  Today she presents with complaints of episode on Tuesday of rapid HR 142 BPM by her BP machine.  She felt lightheaded, no chest pain but her teeth were aching.  It resolved and she has been tired since that time.  She saw her PCP and was referred to [place].  No SOB, no nausea.  She is concerned this may be due to her heart disease.   No further episodes. She has worn a monitor in the past.  Hx of CAD and multiple risk factors.  Recent lipid panel yesterday was improved from 4 months ago. Decrease in LDL particle number.          Allergies  Allergen Reactions  . Tape Rash    ADHESIVE TAPE    Current Outpatient Prescriptions  Medication Sig Dispense Refill  . aspirin 81 MG tablet Take 81 mg by mouth daily.      . diphenhydrAMINE (BENADRYL) 50 MG tablet Take 50 mg by mouth at bedtime as needed for itching or sleep.      Marland Kitchen ibuprofen (ADVIL) 200 MG tablet Take 1 tablet (200 mg total) by mouth 2 (two) times daily as needed.  1 tablet  0  . insulin glargine  (LANTUS SOLOSTAR) 100 UNIT/ML injection Inject 72 Units into the skin at bedtime.       . insulin NPH-insulin regular (NOVOLIN 70/30) (70-30) 100 UNIT/ML injection Inject 72 Units into the skin 2 (two) times daily with a meal.       . isosorbide mononitrate (IMDUR) 30 MG 24 hr tablet Take 1 tablet (30 mg total) by mouth daily.  30 tablet  8  . omeprazole (PRILOSEC OTC) 20 MG tablet Take 20 mg by mouth daily.      . rosuvastatin (CRESTOR) 40 MG tablet Take 1 tablet (40 mg total) by mouth at bedtime.  30 tablet  9  . valsartan-hydrochlorothiazide (DIOVAN-HCT) 160-12.5 MG per tablet Take 1 tablet by mouth daily.      Marland Kitchen zolpidem (AMBIEN) 5 MG tablet Take 5 mg by mouth at bedtime as needed for sleep.      . carvedilol (COREG) 12.5 MG tablet Take 1 tablet (12.5 mg total) by mouth 2 (two) times daily.  60 tablet  6   No current facility-administered medications for this visit.    Past Medical History  Diagnosis Date  . Back pain   . GERD (gastroesophageal reflux disease)   . MI (myocardial infarction)   .  Hypercholesteremia   . Diabetes mellitus   . Anemia   . Cirrhosis   . Aortic sclerosis 07/04/2011  . Aortic insufficiency 07/04/2011    most recent 2D Echo 02/24/2012  EF greater than 55%  . Carotid stenosis, bilateral 07/04/2011    most recent 05/2010 carotid doppler  . OSA (obstructive sleep apnea) 07/04/2011    sleep study 2009  AHI 9.91/hr and during REM sleep 35.58/hr  . Presence of stent in right coronary artery 07/04/2011  . HTN (hypertension) 07/04/2011  . Coronary artery disease   . Myocardial infarction 1995  . GERD (gastroesophageal reflux disease)   . Non-alcoholic fatty liver disease     Past Surgical History  Procedure Laterality Date  . Back surgery  1985  . Tonsillectomy    . Appendectomy    . Dilation and curettage of uterus    . Colonoscopy    . Eye surgery  08    cataract surgery of the left eye  . Ercp    . Abdominal hysterectomy  1972  . Cardiac  catheterization  1995.,2006,1997    stent to the proximal RCA after MI   . Myringotomy  2012    both ears  . Hardware removal Right 01/17/2013    Procedure: REMOVAL OF HARDWARE AND EXCISION ULNAR STYLOID RIGHT WRIST;  Surgeon: Tennis Must, MD;  Location: [place];  Service: Orthopedics;  Laterality: Right;  . Wrist surgery      rt wrist hardwear removal  . Esophagogastroduodenoscopy N/A 04/01/2013    Procedure: ESOPHAGOGASTRODUODENOSCOPY (EGD);  Surgeon: Rogene Houston, MD;  [facility];  Service: Endoscopy;  Laterality: N/A;  230-rescheduled to 8:30am Ann notified pt  . Esophageal banding N/A 04/01/2013    Procedure: ESOPHAGEAL BANDING;  Surgeon: Rogene Houston, MD;  [facility]: [facility];  Service: Endoscopy;  Laterality: N/A;  . Esophagogastroduodenoscopy N/A 05/24/2013    Procedure: ESOPHAGOGASTRODUODENOSCOPY (EGD);  Surgeon: Rogene Houston, MD;  [facility];  Service: Endoscopy;  Laterality: N/A;  730  . Esophageal banding N/A 05/24/2013    Procedure: ESOPHAGEAL BANDING;  Surgeon: Rogene Houston, MD;  [facility];  Service: Endoscopy;  Laterality: N/A;    QZE:SPQZRAQ:TM colds or fevers, no weight changes Skin:no rashes or ulcers HEENT:no blurred vision, no congestion CV:see HPI PUL:see HPI GI:no diarrhea constipation or melena, no indigestion GU:no hematuria, no dysuria MS:no joint pain, no claudication Neuro:no syncope, + lightheadedness with tachycardia Endo:controlled diabetes followed by PCP, no thyroid disease  PHYSICAL EXAM BP 150/62  Pulse 84  Ht 5' 3"  (1.6 m)  Wt 201 lb (91.173 kg)  BMI 35.61 kg/m2 General:Pleasant affect, NAD Skin:Warm and dry, brisk capillary refill HEENT:normocephalic, sclera clear, mucus membranes moist Neck:supple, no JVD,+ bil bruits  Heart:S1S2 RRR with soft systolic murmur, no gallup, rub or click Lungs:clear without rales, rhonchi, or wheezes AUQ:JFHLK, soft, non tender, + BS, do  not palpate liver spleen or masses Ext:no lower ext edema, 2+ pedal pulses, 2+ radial pulses Neuro:alert and oriented, MAE, follows commands, + facial symmetry  EKG: SR septal infarct no acute changes  ASSESSMENT AND PLAN PSVT (paroxysmal supraventricular tachycardia), recent episode this week Episode of SVT rate of 142 this week.  Has not felt well since, + weakness, + teeth aching with the episode could be anginal equivalent.  Will schedule a lexiscan myoview for next week she requested it be done in [place].  She will follow up with Dr. Debara Pickett for results,  though in the future she may request the [place] office to be seen.    Aortic sclerosis stable  CAD (coronary artery disease) Hx of RCA stents 1995, last cath 2007.  Minimal CAD at that time.  Last nuc 2012 negative for ischemia.  With her risk factors will proceed with Lexiscan myoview.  HTN (hypertension) BP elevated, I increased her coreg to 12.5 mg BID, she will call if decreased BP    Hypercholesteremia Improved with recent labs.  Diabetes mellitus Followed by PCP  Cirrhosis of liver without mention of alcohol stable  Carotid stenosis, bilateral stable

## 2013-12-09 NOTE — Patient Instructions (Signed)
We will arrange [name] at [facility] for next week. If you have more tachycardia before then [name] or if it continues go to ER  Have lab work done next week.  Follow up with Dr. Debara Pickett in 2-3 weeks though we will [name] you results of study.  Increase coreg to 12.5 mg BID

## 2013-12-09 NOTE — Assessment & Plan Note (Signed)
BP elevated, I increased her coreg to 12.5 mg BID, she will call if decreased BP

## 2013-12-09 NOTE — Assessment & Plan Note (Signed)
Improved with recent labs.

## 2013-12-09 NOTE — Assessment & Plan Note (Signed)
Hx of RCA stents 1995, last cath 2007.  Minimal CAD at that time.  Last nuc 2012 negative for ischemia.  With her risk factors will proceed with Lexiscan myoview.

## 2013-12-09 NOTE — Assessment & Plan Note (Signed)
Followed by PCP

## 2013-12-09 NOTE — Assessment & Plan Note (Signed)
Episode of SVT rate of 142 this week.  Has not felt well since, + weakness, + teeth aching with the episode could be anginal equivalent.  Will schedule a lexiscan myoview for next week she requested it be done in [place].  She will follow up with Dr. Debara Pickett for results, though in the future she may request the [place] office to be seen.

## 2013-12-12 ENCOUNTER — Other Ambulatory Visit: Payer: Self-pay | Admitting: Cardiology

## 2013-12-12 ENCOUNTER — Other Ambulatory Visit: Payer: Self-pay | Admitting: Internal Medicine

## 2013-12-12 DIAGNOSIS — R000 Tachycardia, unspecified: Secondary | ICD-10-CM

## 2013-12-15 LAB — CBC
HEMATOCRIT: 34.2 % — AB (ref 36.0–46.0)
Hemoglobin: 11.6 g/dL — ABNORMAL LOW (ref 12.0–15.0)
MCH: 30.9 pg (ref 26.0–34.0)
MCHC: 33.9 g/dL (ref 30.0–36.0)
MCV: 91 fL (ref 78.0–100.0)
Platelets: 139 10*3/uL — ABNORMAL LOW (ref 150–400)
RBC: 3.76 MIL/uL — AB (ref 3.87–5.11)
RDW: 13.5 % (ref 11.5–15.5)
WBC: 7.3 10*3/uL (ref 4.0–10.5)

## 2013-12-16 ENCOUNTER — Encounter (HOSPITAL_COMMUNITY): Payer: Self-pay

## 2013-12-16 ENCOUNTER — Encounter (HOSPITAL_COMMUNITY)
Admission: RE | Admit: 2013-12-16 | Discharge: 2013-12-16 | Disposition: A | Discharge status: Home / Self Care | Payer: Medicare Other | Source: Ambulatory Visit | Attending: Cardiology | Admitting: Cardiology

## 2013-12-16 DIAGNOSIS — R000 Tachycardia, unspecified: Secondary | ICD-10-CM

## 2013-12-16 DIAGNOSIS — I471 Supraventricular tachycardia, unspecified: Secondary | ICD-10-CM

## 2013-12-16 DIAGNOSIS — I2510 Atherosclerotic heart disease of native coronary artery without angina pectoris: Secondary | ICD-10-CM

## 2013-12-16 DIAGNOSIS — R42 Dizziness and giddiness: Secondary | ICD-10-CM | POA: Insufficient documentation

## 2013-12-16 LAB — BASIC METABOLIC PANEL WITH GFR
BUN: 21 mg/dL (ref 6–23)
CALCIUM: 8.9 mg/dL (ref 8.4–10.5)
CO2: 25 meq/L (ref 19–32)
Chloride: 105 mEq/L (ref 96–112)
Creat: 1.12 mg/dL — ABNORMAL HIGH (ref 0.50–1.10)
GFR, Est African American: 57 mL/min — ABNORMAL LOW
GFR, Est Non African American: 50 mL/min — ABNORMAL LOW
GLUCOSE: 142 mg/dL — AB (ref 70–99)
POTASSIUM: 4 meq/L (ref 3.5–5.3)
SODIUM: 139 meq/L (ref 135–145)

## 2013-12-16 LAB — TSH: TSH: 1.633 u[IU]/mL (ref 0.350–4.500)

## 2013-12-16 MED ORDER — TECHNETIUM TC 99M SESTAMIBI GENERIC - CARDIOLITE
10.0000 | Freq: Once | INTRAVENOUS | Status: AC | PRN
  Administered 2013-12-16: 10 via INTRAVENOUS

## 2013-12-16 MED ORDER — SODIUM CHLORIDE 0.9 % IJ SOLN
INTRAMUSCULAR | Status: AC
  Administered 2013-12-16: 10 mL via INTRAVENOUS
  Filled 2013-12-16: qty 10

## 2013-12-16 MED ORDER — TECHNETIUM TC 99M SESTAMIBI - CARDIOLITE
30.0000 | Freq: Once | INTRAVENOUS | Status: AC | PRN
  Administered 2013-12-16: 11:00:00 30 via INTRAVENOUS

## 2013-12-16 MED ORDER — REGADENOSON 0.4 MG/5ML IV SOLN
INTRAVENOUS | Status: AC
Start: 2013-12-16 — End: 2013-12-16
  Administered 2013-12-16: 0.4 mg via INTRAVENOUS
  Filled 2013-12-16: qty 5

## 2013-12-16 NOTE — Progress Notes (Signed)
Stress Lab Nurses Notes - Crystallynn Noorani Fix 12/16/2013 Reason for doing test: CAD and PSVT Type of test: Wille Glaser Nurse performing test: Gerrit Halls, RN Nuclear Medicine Tech: Redmond Baseman Echo Tech: Not Applicable MD performing test: Gaynelle Cage MD: [place] Test explained and consent signed: yes IV started: 22g jelco, Saline lock flushed, No redness or edema and Saline lock started in radiology Symptoms: SOB Treatment/Intervention: None Reason test stopped: protocol completed After recovery IV was: Discontinued via X-ray tech and No redness or edema Patient to return to [place]. Med at : 11:30 Patient discharged: Home Patient's Condition upon discharge was: stable Comments: During test BP 146/56 & HR 83.  Recovery BP 134/58 & HR 72.  Symptoms resolved in recovery. Donnajean Lopes

## 2013-12-19 ENCOUNTER — Encounter (HOSPITAL_COMMUNITY): Payer: Self-pay | Admitting: Pharmacy Technician

## 2013-12-27 ENCOUNTER — Other Ambulatory Visit (HOSPITAL_COMMUNITY): Payer: Medicare Other

## 2014-01-03 ENCOUNTER — Encounter (HOSPITAL_COMMUNITY): Admission: RE | Payer: Self-pay | Source: Ambulatory Visit

## 2014-01-03 ENCOUNTER — Ambulatory Visit (HOSPITAL_COMMUNITY): Admission: RE | Admit: 2014-01-03 | Payer: Medicare Other | Source: Ambulatory Visit | Admitting: Ophthalmology

## 2014-01-03 SURGERY — PHACOEMULSIFICATION, CATARACT, WITH IOL INSERTION
Anesthesia: Monitor Anesthesia Care | Laterality: Right

## 2014-01-06 ENCOUNTER — Ambulatory Visit: Payer: Medicare Other | Admitting: Internal Medicine

## 2014-01-17 ENCOUNTER — Other Ambulatory Visit (HOSPITAL_COMMUNITY): Payer: Self-pay | Admitting: Family Medicine

## 2014-01-17 DIAGNOSIS — Z1231 Encounter for screening mammogram for malignant neoplasm of breast: Secondary | ICD-10-CM

## 2014-02-01 ENCOUNTER — Ambulatory Visit: Payer: Medicare Other | Admitting: Internal Medicine

## 2014-02-13 ENCOUNTER — Ambulatory Visit (INDEPENDENT_AMBULATORY_CARE_PROVIDER_SITE_OTHER): Payer: Medicare Other | Admitting: Internal Medicine

## 2014-02-17 ENCOUNTER — Ambulatory Visit (HOSPITAL_COMMUNITY): Payer: Medicare Other

## 2014-03-06 ENCOUNTER — Emergency Department (HOSPITAL_COMMUNITY): Payer: Medicare Other

## 2014-03-06 ENCOUNTER — Inpatient Hospital Stay (HOSPITAL_COMMUNITY): Payer: Medicare Other

## 2014-03-06 ENCOUNTER — Inpatient Hospital Stay (HOSPITAL_COMMUNITY)
Admission: EM | Admit: 2014-03-06 | Discharge: 2014-03-07 | DRG: 065 | Disposition: A | Discharge status: Home Health | Payer: Medicare Other | Attending: Family Medicine | Admitting: Family Medicine

## 2014-03-06 ENCOUNTER — Encounter (HOSPITAL_COMMUNITY): Payer: Self-pay | Admitting: Emergency Medicine

## 2014-03-06 DIAGNOSIS — Z7982 Long term (current) use of aspirin: Secondary | ICD-10-CM

## 2014-03-06 DIAGNOSIS — I471 Supraventricular tachycardia: Secondary | ICD-10-CM

## 2014-03-06 DIAGNOSIS — Z833 Family history of diabetes mellitus: Secondary | ICD-10-CM

## 2014-03-06 DIAGNOSIS — I8500 Esophageal varices without bleeding: Secondary | ICD-10-CM

## 2014-03-06 DIAGNOSIS — E119 Type 2 diabetes mellitus without complications: Secondary | ICD-10-CM | POA: Diagnosis present

## 2014-03-06 DIAGNOSIS — I2510 Atherosclerotic heart disease of native coronary artery without angina pectoris: Secondary | ICD-10-CM | POA: Diagnosis present

## 2014-03-06 DIAGNOSIS — E118 Type 2 diabetes mellitus with unspecified complications: Secondary | ICD-10-CM

## 2014-03-06 DIAGNOSIS — G4733 Obstructive sleep apnea (adult) (pediatric): Secondary | ICD-10-CM | POA: Diagnosis present

## 2014-03-06 DIAGNOSIS — R079 Chest pain, unspecified: Secondary | ICD-10-CM

## 2014-03-06 DIAGNOSIS — E7800 Pure hypercholesterolemia, unspecified: Secondary | ICD-10-CM | POA: Diagnosis present

## 2014-03-06 DIAGNOSIS — I6529 Occlusion and stenosis of unspecified carotid artery: Secondary | ICD-10-CM | POA: Diagnosis present

## 2014-03-06 DIAGNOSIS — T502X5A Adverse effect of carbonic-anhydrase inhibitors, benzothiadiazides and other diuretics, initial encounter: Secondary | ICD-10-CM | POA: Diagnosis present

## 2014-03-06 DIAGNOSIS — I10 Essential (primary) hypertension: Secondary | ICD-10-CM | POA: Diagnosis present

## 2014-03-06 DIAGNOSIS — Z87440 Personal history of urinary (tract) infections: Secondary | ICD-10-CM

## 2014-03-06 DIAGNOSIS — I252 Old myocardial infarction: Secondary | ICD-10-CM

## 2014-03-06 DIAGNOSIS — IMO0001 Reserved for inherently not codable concepts without codable children: Secondary | ICD-10-CM

## 2014-03-06 DIAGNOSIS — I351 Nonrheumatic aortic (valve) insufficiency: Secondary | ICD-10-CM

## 2014-03-06 DIAGNOSIS — E860 Dehydration: Secondary | ICD-10-CM | POA: Diagnosis present

## 2014-03-06 DIAGNOSIS — N393 Stress incontinence (female) (male): Secondary | ICD-10-CM | POA: Diagnosis present

## 2014-03-06 DIAGNOSIS — Z955 Presence of coronary angioplasty implant and graft: Secondary | ICD-10-CM

## 2014-03-06 DIAGNOSIS — I6350 Cerebral infarction due to unspecified occlusion or stenosis of unspecified cerebral artery: Principal | ICD-10-CM | POA: Diagnosis present

## 2014-03-06 DIAGNOSIS — E785 Hyperlipidemia, unspecified: Secondary | ICD-10-CM | POA: Diagnosis present

## 2014-03-06 DIAGNOSIS — K7689 Other specified diseases of liver: Secondary | ICD-10-CM | POA: Diagnosis present

## 2014-03-06 DIAGNOSIS — Z9861 Coronary angioplasty status: Secondary | ICD-10-CM

## 2014-03-06 DIAGNOSIS — I6523 Occlusion and stenosis of bilateral carotid arteries: Secondary | ICD-10-CM

## 2014-03-06 DIAGNOSIS — K7460 Unspecified cirrhosis of liver: Secondary | ICD-10-CM | POA: Diagnosis present

## 2014-03-06 DIAGNOSIS — E1149 Type 2 diabetes mellitus with other diabetic neurological complication: Secondary | ICD-10-CM

## 2014-03-06 DIAGNOSIS — Z794 Long term (current) use of insulin: Secondary | ICD-10-CM

## 2014-03-06 DIAGNOSIS — R29810 Facial weakness: Secondary | ICD-10-CM | POA: Diagnosis present

## 2014-03-06 DIAGNOSIS — N179 Acute kidney failure, unspecified: Secondary | ICD-10-CM | POA: Diagnosis present

## 2014-03-06 DIAGNOSIS — I639 Cerebral infarction, unspecified: Secondary | ICD-10-CM | POA: Diagnosis present

## 2014-03-06 DIAGNOSIS — R4701 Aphasia: Secondary | ICD-10-CM | POA: Diagnosis present

## 2014-03-06 DIAGNOSIS — Z8249 Family history of ischemic heart disease and other diseases of the circulatory system: Secondary | ICD-10-CM

## 2014-03-06 DIAGNOSIS — N390 Urinary tract infection, site not specified: Secondary | ICD-10-CM | POA: Diagnosis present

## 2014-03-06 DIAGNOSIS — K219 Gastro-esophageal reflux disease without esophagitis: Secondary | ICD-10-CM | POA: Diagnosis present

## 2014-03-06 DIAGNOSIS — Z87891 Personal history of nicotine dependence: Secondary | ICD-10-CM

## 2014-03-06 DIAGNOSIS — I658 Occlusion and stenosis of other precerebral arteries: Secondary | ICD-10-CM | POA: Diagnosis present

## 2014-03-06 LAB — URINALYSIS, ROUTINE W REFLEX MICROSCOPIC
BILIRUBIN URINE: NEGATIVE
Glucose, UA: NEGATIVE mg/dL
HGB URINE DIPSTICK: NEGATIVE
KETONES UR: NEGATIVE mg/dL
NITRITE: NEGATIVE
PROTEIN: NEGATIVE mg/dL
Specific Gravity, Urine: 1.01 (ref 1.005–1.030)
UROBILINOGEN UA: 0.2 mg/dL (ref 0.0–1.0)
pH: 5.5 (ref 5.0–8.0)

## 2014-03-06 LAB — PROTIME-INR
INR: 1.18 (ref 0.00–1.49)
Prothrombin Time: 15 seconds (ref 11.6–15.2)

## 2014-03-06 LAB — URINE MICROSCOPIC-ADD ON

## 2014-03-06 LAB — COMPREHENSIVE METABOLIC PANEL
ALT: 25 U/L (ref 0–35)
AST: 30 U/L (ref 0–37)
Albumin: 3.4 g/dL — ABNORMAL LOW (ref 3.5–5.2)
Alkaline Phosphatase: 108 U/L (ref 39–117)
BILIRUBIN TOTAL: 0.5 mg/dL (ref 0.3–1.2)
BUN: 27 mg/dL — ABNORMAL HIGH (ref 6–23)
CALCIUM: 9.3 mg/dL (ref 8.4–10.5)
CO2: 22 meq/L (ref 19–32)
Chloride: 102 mEq/L (ref 96–112)
Creatinine, Ser: 1.21 mg/dL — ABNORMAL HIGH (ref 0.50–1.10)
GFR calc non Af Amer: 44 mL/min — ABNORMAL LOW (ref >90)
GFR, EST AFRICAN AMERICAN: 51 mL/min — AB (ref >90)
Glucose, Bld: 177 mg/dL — ABNORMAL HIGH (ref 70–99)
Potassium: 4 mEq/L (ref 3.7–5.3)
SODIUM: 139 meq/L (ref 137–147)
Total Protein: 6.8 g/dL (ref 6.0–8.3)

## 2014-03-06 LAB — CBC WITH DIFFERENTIAL/PLATELET
BASOS ABS: 0 10*3/uL (ref 0.0–0.1)
Basophils Relative: 0 % (ref 0–1)
EOS PCT: 2 % (ref 0–5)
Eosinophils Absolute: 0.2 10*3/uL (ref 0.0–0.7)
HCT: 33.9 % — ABNORMAL LOW (ref 36.0–46.0)
Hemoglobin: 11.4 g/dL — ABNORMAL LOW (ref 12.0–15.0)
LYMPHS ABS: 1 10*3/uL (ref 0.7–4.0)
Lymphocytes Relative: 15 % (ref 12–46)
MCH: 30.6 pg (ref 26.0–34.0)
MCHC: 33.6 g/dL (ref 30.0–36.0)
MCV: 91.1 fL (ref 78.0–100.0)
MONO ABS: 0.6 10*3/uL (ref 0.1–1.0)
Monocytes Relative: 9 % (ref 3–12)
Neutro Abs: 5.2 10*3/uL (ref 1.7–7.7)
Neutrophils Relative %: 74 % (ref 43–77)
PLATELETS: 149 10*3/uL — AB (ref 150–400)
RBC: 3.72 MIL/uL — AB (ref 3.87–5.11)
RDW: 14.4 % (ref 11.5–15.5)
WBC: 7 10*3/uL (ref 4.0–10.5)

## 2014-03-06 LAB — LIPID PANEL
CHOLESTEROL: 215 mg/dL — AB (ref 0–200)
HDL: 42 mg/dL (ref >39)
LDL CALC: 147 mg/dL — AB (ref 0–99)
TRIGLYCERIDES: 129 mg/dL (ref <150)
Total CHOL/HDL Ratio: 5.1 RATIO
VLDL: 26 mg/dL (ref 0–40)

## 2014-03-06 LAB — GLUCOSE, CAPILLARY
GLUCOSE-CAPILLARY: 138 mg/dL — AB (ref 70–99)
Glucose-Capillary: 130 mg/dL — ABNORMAL HIGH (ref 70–99)

## 2014-03-06 LAB — APTT: aPTT: 29 seconds (ref 24–37)

## 2014-03-06 LAB — TROPONIN I: Troponin I: <0.3 ng/mL (ref <0.30)

## 2014-03-06 MED ORDER — SULFAMETHOXAZOLE-TMP DS 800-160 MG PO TABS
1.0000 | ORAL_TABLET | Freq: Every day | ORAL | Status: DC
  Administered 2014-03-07: 1 via ORAL
  Filled 2014-03-06 (×2): qty 1

## 2014-03-06 MED ORDER — ASPIRIN 325 MG PO TABS
325.0000 mg | ORAL_TABLET | Freq: Every day | ORAL | Status: DC
  Administered 2014-03-07: 325 mg via ORAL
  Filled 2014-03-06 (×2): qty 1

## 2014-03-06 MED ORDER — ENOXAPARIN SODIUM 40 MG/0.4ML ~~LOC~~ SOLN
40.0000 mg | SUBCUTANEOUS | Status: DC
  Administered 2014-03-06: 40 mg via SUBCUTANEOUS
  Filled 2014-03-06: qty 0.4

## 2014-03-06 MED ORDER — ATORVASTATIN CALCIUM 40 MG PO TABS
40.0000 mg | ORAL_TABLET | Freq: Every day | ORAL | Status: DC
  Administered 2014-03-06: 40 mg via ORAL
  Filled 2014-03-06: qty 1

## 2014-03-06 MED ORDER — INSULIN ASPART 100 UNIT/ML ~~LOC~~ SOLN
0.0000 [IU] | Freq: Three times a day (TID) | SUBCUTANEOUS | Status: DC
Start: 2014-03-06 — End: 2014-03-07
  Administered 2014-03-06 – 2014-03-07 (×2): 2 [IU] via SUBCUTANEOUS

## 2014-03-06 MED ORDER — ZOLPIDEM TARTRATE 5 MG PO TABS
5.0000 mg | ORAL_TABLET | Freq: Every evening | ORAL | Status: DC | PRN
  Administered 2014-03-06: 5 mg via ORAL
  Filled 2014-03-06: qty 1

## 2014-03-06 MED ORDER — PANTOPRAZOLE SODIUM 40 MG PO TBEC
40.0000 mg | DELAYED_RELEASE_TABLET | Freq: Every day | ORAL | Status: DC
  Administered 2014-03-07: 40 mg via ORAL
  Filled 2014-03-06 (×3): qty 1

## 2014-03-06 MED ORDER — DIPHENHYDRAMINE HCL 25 MG PO TABS
50.0000 mg | ORAL_TABLET | Freq: Every evening | ORAL | Status: DC | PRN
Start: 2014-03-06 — End: 2014-03-06

## 2014-03-06 MED ORDER — INSULIN ASPART 100 UNIT/ML ~~LOC~~ SOLN: 0.0000 [IU] | Freq: Every day | SUBCUTANEOUS | Status: DC

## 2014-03-06 MED ORDER — INSULIN GLARGINE 100 UNIT/ML ~~LOC~~ SOLN
36.0000 [IU] | Freq: Every day | SUBCUTANEOUS | Status: DC
  Administered 2014-03-06: 36 [IU] via SUBCUTANEOUS
  Filled 2014-03-06 (×2): qty 0.36

## 2014-03-06 MED ORDER — SODIUM CHLORIDE 0.9 % IV BOLUS (SEPSIS)
250.0000 mL | Freq: Once | INTRAVENOUS | Status: AC
  Administered 2014-03-06: 250 mL via INTRAVENOUS

## 2014-03-06 MED ORDER — ASPIRIN 81 MG PO CHEW
324.0000 mg | CHEWABLE_TABLET | Freq: Once | ORAL | Status: AC
  Administered 2014-03-06: 324 mg via ORAL
  Filled 2014-03-06: qty 4

## 2014-03-06 MED ORDER — SODIUM CHLORIDE 0.9 % IV SOLN
INTRAVENOUS | Status: DC
  Administered 2014-03-06: 15:00:00 via INTRAVENOUS

## 2014-03-06 MED ORDER — ISOSORBIDE MONONITRATE ER 60 MG PO TB24
30.0000 mg | ORAL_TABLET | Freq: Every day | ORAL | Status: DC
  Administered 2014-03-07: 30 mg via ORAL
  Filled 2014-03-06 (×2): qty 1

## 2014-03-06 MED ORDER — SENNOSIDES-DOCUSATE SODIUM 8.6-50 MG PO TABS: 1.0000 | ORAL_TABLET | Freq: Every evening | ORAL | Status: DC | PRN

## 2014-03-06 MED ORDER — ATORVASTATIN CALCIUM 40 MG PO TABS: 60.0000 mg | ORAL_TABLET | Freq: Every day | ORAL | Status: DC

## 2014-03-06 MED ORDER — SODIUM CHLORIDE 0.9 % IV SOLN: INTRAVENOUS | Status: DC

## 2014-03-06 MED ORDER — ATORVASTATIN CALCIUM 10 MG PO TABS: 10.0000 mg | ORAL_TABLET | Freq: Every day | ORAL | Status: DC

## 2014-03-06 NOTE — H&P (Signed)
[facility] History and Physical  [name] IHW:[id number] DOB: 07/19/1941 DOA: 03/06/2014  Referring physician:  PCP: Leonides Grills, MD   Chief Complaint: aphasia  HPI: [name] is a 73 y.o. female with a past medical history that includes CAD, diabetes, hypercholesterolemia anemia, hypertension, carotid stenosis presents to the emergency department with the chief complaint of aphasia. Initial workup in the emergency department included an MRI of the brain which revealed acute infarct in the right frontal operculum and right insular cortex with decreased flow or occlusion of right middle cerebral artery branch supplying this area. Negative for hemorrhagic transformation.  Patient reports she was in her usual state of health until last evening when she had sudden onset of nausea. She states she and related to the bathroom to get some medicine and felt unsteady on her feet. She also noticed that she was unable to hold the pill bottle and unable to pick up spilled pills. She states she ambulated back to bed where she "dozed most of the night". This morning she awakened and felt no better so she called her son and she was brought to the emergency department. Associated symptoms include some numbness and tingling to the left side of her face along with a facial droop on the left and some slurred speech. She denies headache visual disturbances numbness or tingling of extremities. She denies any chest pain palpitations diaphoresis vomiting shortness of breath. She states that she keeps "a urinary tract infection" and suffers from stress incontinence.   Emergency department she is given 325 mg aspirin. MRI of the brain as above. CT of the head reveals findings are consistent with early ischemic change in the posterior aspect of the right frontal lobe. EKG with prolonged PR interval. No acute changes. Complete metabolic panel reveals creatinine of 1.21 serum glucose of 177. CBC with a  hemoglobin 11.4 and platelets 149. Urinalysis with few bacteria diffuse, cells small leukocytes WBCs 3-6.  She is hemodynamically stable afebrile and not hypoxic. We are asked to admit  Review of Systems:  10 point review of systems completed and all systems are negative except as indicated in the history of present illness  Past Medical History  Diagnosis Date  . Back pain   . GERD (gastroesophageal reflux disease)   . MI (myocardial infarction)   . Hypercholesteremia   . Diabetes mellitus   . Anemia   . Cirrhosis   . Aortic sclerosis 07/04/2011  . Aortic insufficiency 07/04/2011    most recent 2D Echo 02/24/2012  EF greater than 55%  . Carotid stenosis, bilateral 07/04/2011    most recent 05/2010 carotid doppler  . OSA (obstructive sleep apnea) 07/04/2011    sleep study 2009  AHI 9.91/hr and during REM sleep 35.58/hr  . Presence of stent in right coronary artery 07/04/2011  . HTN (hypertension) 07/04/2011  . Coronary artery disease   . Myocardial infarction 1995  . GERD (gastroesophageal reflux disease)   . Non-alcoholic fatty liver disease   . UTI (lower urinary tract infection)     chronic/recurring/on bactrim   Past Surgical History  Procedure Laterality Date  . Back surgery  1985  . Tonsillectomy    . Appendectomy    . Dilation and curettage of uterus    . Colonoscopy    . Eye surgery  08    cataract surgery of the left eye  . Ercp    . Abdominal hysterectomy  1972  . Cardiac catheterization  [phone]  stent to the proximal RCA after MI   . Myringotomy  2012    both ears  . Hardware removal Right 01/17/2013    Procedure: REMOVAL OF HARDWARE AND EXCISION ULNAR STYLOID RIGHT WRIST;  Surgeon: Tennis Must, MD;  Location: [facility];  Service: Orthopedics;  Laterality: Right;  . Wrist surgery      rt wrist hardwear removal  . Esophagogastroduodenoscopy N/A 04/01/2013    Procedure: ESOPHAGOGASTRODUODENOSCOPY (EGD);  Surgeon: Rogene Houston,  MD;  [facility];  Service: Endoscopy;  Laterality: N/A;  230-rescheduled to 8:30am Ann notified pt  . Esophageal banding N/A 04/01/2013    Procedure: ESOPHAGEAL BANDING;  Surgeon: Rogene Houston, MD;  [facility];  Service: Endoscopy;  Laterality: N/A;  . Esophagogastroduodenoscopy N/A 05/24/2013    Procedure: ESOPHAGOGASTRODUODENOSCOPY (EGD);  Surgeon: Rogene Houston, MD;  [facility];  Service: Endoscopy;  Laterality: N/A;  730  . Esophageal banding N/A 05/24/2013    Procedure: ESOPHAGEAL BANDING;  Surgeon: Rogene Houston, MD;  [facility];  Service: Endoscopy;  Laterality: N/A;   Social History:  reports that she quit smoking about 2 years ago. Her smoking use included Cigarettes. She smoked 0.25 packs per day. She has never used smokeless tobacco. She reports that she does not drink alcohol or use illicit drugs. 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 Allergies  Allergen Reactions  . Tape Rash    ADHESIVE TAPE    Family History  Problem Relation Age of Onset  . Diabetes Mother   . Heart failure Father      Prior to Admission medications   Medication Sig Start Date End Date Taking? Authorizing Provider  aspirin 81 MG tablet Take 81 mg by mouth daily.   Yes Historical Provider, MD  carvedilol (COREG) 12.5 MG tablet Take 1 tablet (12.5 mg total) by mouth 2 (two) times daily. 12/09/13  Yes Cecilie Kicks, NP  diphenhydrAMINE (BENADRYL) 50 MG tablet Take 50 mg by mouth at bedtime as needed for itching or sleep.   Yes Historical Provider, MD  insulin glargine (LANTUS SOLOSTAR) 100 UNIT/ML injection Inject 72 Units into the skin at bedtime.    Yes Historical Provider, MD  insulin NPH-insulin regular (NOVOLIN 70/30) (70-30) 100 UNIT/ML injection Inject 72 Units into the skin 2 (two) times daily with a meal.    Yes Historical Provider, MD    isosorbide mononitrate (IMDUR) 30 MG 24 hr tablet Take 1 tablet (30 mg total) by mouth daily. 05/10/13  Yes Pixie Casino, MD  omeprazole (PRILOSEC OTC) 20 MG tablet Take 20 mg by mouth daily.   Yes Historical Provider, MD  rosuvastatin (CRESTOR) 40 MG tablet Take 1 tablet (40 mg total) by mouth at bedtime. 07/29/13  Yes Pixie Casino, MD  sulfamethoxazole-trimethoprim (BACTRIM DS) 800-160 MG per tablet Take 1 tablet by mouth daily. 03/03/14  Yes Historical Provider, MD  valsartan-hydrochlorothiazide (DIOVAN-HCT) 160-12.5 MG per tablet Take 1 tablet by mouth daily.   Yes Historical Provider, MD  zolpidem (AMBIEN) 5 MG tablet Take 5 mg by mouth at bedtime as needed for sleep.   Yes Historical Provider, MD   Physical Exam: Filed Vitals:   03/06/14 1309  BP: 154/62  Pulse: 92  Temp:   Resp: 20    BP 154/62  Pulse 92  Temp(Src) 98.9 F (37.2  C) (Oral)  Resp 20  Ht 5' 3"  (1.6 m)  Wt 92.987 kg (205 lb)  BMI 36.32 kg/m2  SpO2 98%  General:  Appears calm and comfortable, somewhat anxious Eyes: PERRL, normal lids, irises & conjunctiva ENT: Ears clear nose without drainage oropharynx without erythema or exudate. Mucous membranes of her mouth are pink slightly dry Neck: no LAD, masses or thyromegaly, full range of motion Cardiovascular: RRR, no m/r/g. No LE edema. Pedal pulses present and palpable Telemetry: SR, no arrhythmias  Respiratory: CTA bilaterally, no w/r/r. Normal respiratory effort. Abdomen: soft, ntnd positive bowel sounds throughout. No mass organomegaly noted Skin: no rash or induration seen on limited exam Musculoskeletal: grossly normal tone BUE/BLE Psychiatric: grossly normal mood and affect, speech appropriate Neurologic: Left facial droop, able to close left eye but only loosely. Decreased sensation on left side of face. Decreased movement of the mouth cheek and 4 head on the left. Speech remains somewhat slurred. Tongue deviates to the left. Bilateral grip 4/5.  Lower extremity strength 5 out of 5 bilaterally           Labs on Admission:  Basic Metabolic Panel:  Recent Labs Lab 03/06/14 0907  NA 139  K 4.0  CL 102  CO2 22  GLUCOSE 177*  BUN 27*  CREATININE 1.21*  CALCIUM 9.3   Liver Function Tests:  Recent Labs Lab 03/06/14 0907  AST 30  ALT 25  ALKPHOS 108  BILITOT 0.5  PROT 6.8  ALBUMIN 3.4*   No results found for this basename: LIPASE, AMYLASE,  in the last 168 hours No results found for this basename: AMMONIA,  in the last 168 hours CBC:  Recent Labs Lab 03/06/14 0907  WBC 7.0  NEUTROABS 5.2  HGB 11.4*  HCT 33.9*  MCV 91.1  PLT 149*   Cardiac Enzymes:  Recent Labs Lab 03/06/14 0907  TROPONINI <0.30    BNP (last 3 results) No results found for this basename: PROBNP,  in the last 8760 hours CBG: No results found for this basename: GLUCAP,  in the last 168 hours  Radiological Exams on Admission: Ct Head Wo Contrast  03/06/2014   ADDENDUM REPORT: 03/06/2014 11:53  ADDENDUM: The small amount of increased density in the sylvian fissure is consistent with intraluminal thrombus in light of the subsequent MRI findings. It is not now felt to reflect acute blood.   Electronically Signed   By: David  Martinique   On: 03/06/2014 11:53   03/06/2014   CLINICAL DATA:  Status post multiple falls; left-sided facial droop  EXAM: CT HEAD WITHOUT CONTRAST  TECHNIQUE: Contiguous axial images were obtained from the base of the skull through the vertex without intravenous contrast.  COMPARISON:  Noncontrast CT scan of the brain of February 24, 2012.  FINDINGS: There is new subtle decreased density within the right frontal lobe posteriorly. There is a tiny amount of increased density within the sylvian fissure on the right which is new and may reflect small amounts of blood. There is stable bifrontal cerebral atrophy. There is no shift of the midline. Old lacunar infarctions in the basal ganglia are present bilaterally. The cerebellum and  brainstem are normal in density.  At bone window settings the observed portions of the paranasal sinuses and mastoid air cells are clear. There is no acute skull fracture.  IMPRESSION: 1. The findings are consistent with early ischemic change in the posterior aspect of the right frontal lobe. A tiny amount of acute blood within or adjacent to  the sylvian fissure on the right is present. 2. There is no CT evidence of significantly increased intracranial pressure. 3. No acute skull fracture is demonstrated. 4. Critical Value/emergent results were called by telephone at the time of interpretation on 03/06/2014 at 10:11 AM to Dr. Fredia Sorrow , who verbally acknowledged these results.  Electronically Signed: By: David  Martinique On: 03/06/2014 10:12   Mr Brain Wo Contrast  03/06/2014   CLINICAL DATA:  Left facial droop.  Aphasia  EXAM: MRI HEAD WITHOUT CONTRAST  TECHNIQUE: Multiplanar, multiecho pulse sequences of the brain and surrounding structures were obtained without intravenous contrast.  COMPARISON:  CT head 03/06/2014  FINDINGS: Acute infarct in the right frontal operculum with some extension into the posterior insula. No other areas of acute infarct are identified.  Chronic microvascular ischemic changes in the white matter. Chronic infarcts in the left thalamus and pons bilaterally.  Negative for hemorrhage. Hyperdensity in the right sylvian fissure on the CT felt to be clot in the right MCA. There is slow flow or occlusion in the right parietal branch of the MCA on FLAIR imaging.  Ventricle size is normal. No shift of the midline structures. Negative for mass lesion.  Mastoid sinus effusion bilaterally.  IMPRESSION: Acute infarct in the right frontal operculum and right insular cortex with decreased flow or occlusion of right middle cerebral artery branch supplying this area. Negative for hemorrhagic transformation.   Electronically Signed   By: Franchot Gallo M.D.   On: 03/06/2014 11:54    EKG:  Independently reviewed sinus rhythm  Assessment/Plan Principal Problem:   CVA (cerebral vascular accident): Per MRI right frontal infarct. Patient with history of CAD, diabetes, carotid stenosis, smoking Will admit to telemetry. Onset of symptoms 11:30 last night. Will obtain MRA The head/brain, as well as a 2-D echo and carotid Dopplers. Will check a lipid panel, hemoglobin A1c. Will ask physical therapy and occupational therapy for evaluation/recommendations. Patient has passed bedside swallow eval so we'll provide with heart healthy car modified diet.  She was given 325 mg of aspirin in the emergency department. Home medications include 81 mg of aspirin daily as well as Crestor. In addition home medications include Coreg, Diovan, HCTZ and indoor. Will hold these for now allowing for permissive hypertension. Will monitor closely and resume as indicated. Will continue aspirin at the higher dose and Crestor. Have requested neurology consult.  Active Problems: Acute renal failure; likely related to decreased by mouth intake as the patient appears slightly dry. Will hold any nephrotoxins. Will provide gentle IV hydration. Will monitor output. Will recheck in the morning.    UTI (lower urinary tract infection): Patient reports history of chronic/recurrent urinary tract infection. Home medications include Bactrim. Will obtain urine culture. Will adjust antibiotics as indicated.    CAD (coronary artery disease): Denies chest pain. History of RCA stent in 1995. Chart review indicates last cath was 2007 and chart indicates minimal CAD at that time. She had a lexiscan Myoview which was normal approximately 2 months ago She saw cardiology in April of this year.    DM (diabetes mellitus): Will obtain a hemoglobin A1c. Will continue her Lantus at half her home dose which is 72 units. She's also on novel and 70 3072 units twice a day at home. Will hold this for now as her appetite is unreliable. Will provide  sliding scale insulin for optimal control.    Hypercholesteremia: Obtain a lipid panel. Continue statin    GERD (gastroesophageal reflux disease): Stable at  baseline.    OSA (obstructive sleep apnea):     HTN (hypertension): Systolic blood pressure range in the emergency department is 121-154. Will hold Coreg, Imdur, Diovan, HCTZ for now. Monitor closely.    Carotid stenosis, bilateral: Chart review indicates patient had carotid ultrasound in November of 2014 which revealed slightly worse blood flow on the left and in the left subclavian artery as well. Her Crestor dose was increased at this time.    Cirrhosis of liver without mention of alcohol: Chart review indicates history of cirrhosis secondary to NAFLD diagnosed by liver biopsy in April 2008 revealing stage IV fibrosis and steatosis. In addition status post EGD 7/14 when 3 varices were banded and she had portal gastropathy.    Dr Merlene Laughter neurology    Code Status: full ily Communication: none present Disposition Plan: home when ready  Time spent: 65 minutes  [facility] Pager [phone]  **Disclaimer: This note may have been dictated with voice recognition software. Similar sounding words can inadvertently be transcribed and this note may contain transcription errors which may not have been corrected upon publication of note.**

## 2014-03-06 NOTE — ED Notes (Signed)
Pt found by [employer] NT sitting on bottom in front of toilet in bathroom. Pt stated she slipped trying to sit on toilet. Pt denied any pain after fall. Head to toe assessment performed after fall by S Hunnicutt RN with no obvious deformities/bruising/abrasions noted. Mental status same as prior to fall. Pt pulled call bell in bathroom after falling. Pt denies hitting head also. Pt was ambulated to bathroom by Deanna Mabe NT and was stable with ambulation.

## 2014-03-06 NOTE — Consult Note (Signed)
[place] A. Merlene Laughter, MD     [email or web address]          [name] is an 73 y.o. female.   ASSESSMENT/PLAN: 1. Acute right MCA ischemic infarct. This is likely due to the cranial occlusive disease/atherosclerosis. Risk factors, obstructive sleep apnea syndrome, diabetes, myocardial infarction and dyslipidemia. I would suggest that the patient be placed on dual antiplatelet agent for 3 months. Subsequent, she can be switched to Plavix. She does seem to have elevated LDL. I would suggest that the Lipitor be increased. I did go ahead and in and increases to 60 mg. She has an MRA of the brain which is pending.  2. Left extracranial ICA stenosis which is asymptomatic. Plan will be to manage this medically as above.  The patient is 73 year old white female who developed the acute onset of difficulty using her hands bilaterally at 11 PM last night. She was last known at that time but presented 12 hours later she was noted by her son to have facial asymmetry with a left facial weakness. She does not report having some dysarthria. The bilateral weakness of the hands seem to have resolved. The patient does not report headaches, dysphagia, Shortness of breath, chest pain, GI or GU symptoms. The patient reports that she has been compliant with aspirin and also her Lipitor. The review of systems otherwise negative.  GENERAL: Severe pleasant overweight female in no acute distress.  HEENT: Supple. Atraumatic normocephalic.   ABDOMEN: soft  EXTREMITIES: No edema   BACK: Normal.  SKIN: Normal by inspection.    MENTAL STATUS: Alert and oriented. Speech, language and cognition are generally intact. Judgment and insight normal.   CRANIAL NERVES: Pupils are equal, round and reactive to light and accommodation; extra ocular movements are full, there is no significant nystagmus; visual fields are full; There is marked weakness of the left facial region involving both the upper and  lower parts although the lower part is more severely impaired, there is flattening of the nasolabial fold- L; tongue Protrudes slightly to the left; uvula is midline; shoulder elevation is normal.  MOTOR: Normal tone, bulk and strength; no pronator drift.  COORDINATION: Left finger to nose is normal, right finger to nose is normal, No rest tremor; no intention tremor; no postural tremor; no bradykinesia.  REFLEXES: Deep tendon reflexes are symmetrical and normal. Babinski reflexes are flexor bilaterally.   SENSATION: Normal to light touch.   Head CT scan is reviewed in person and shows loss of gray-white differentiation involving the right temporal frontal region approximately the anterior MCA distribution. There is a tiny increased signal/hyperintensities seen in the sylvian fissure originally read as blood in the area. MRI is also reviewed and does not show any areas of blood in this area of the sylvian fissure. The area on the CT scan has been re- read as likely clot intraluminal in this area which seems more likely.There is a typical finding seen on diffusion imaging of increased signal again in the right temporal frontal area including the insular cortex. There is corresponding decreased signal seen in the ADC scan.      Past Medical History  Diagnosis Date  . Back pain   . GERD (gastroesophageal reflux disease)   . MI (myocardial infarction)   . Hypercholesteremia   . Diabetes mellitus   . Anemia   . Cirrhosis   . Aortic sclerosis 07/04/2011  . Aortic insufficiency 07/04/2011    most recent 2D Echo 02/24/2012  EF greater  than 55%  . Carotid stenosis, bilateral 07/04/2011    most recent 05/2010 carotid doppler  . OSA (obstructive sleep apnea) 07/04/2011    sleep study 2009  AHI 9.91/hr and during REM sleep 35.58/hr  . Presence of stent in right coronary artery 07/04/2011  . HTN (hypertension) 07/04/2011  . Coronary artery disease   . Myocardial infarction 1995  . GERD  (gastroesophageal reflux disease)   . Non-alcoholic fatty liver disease   . UTI (lower urinary tract infection)     chronic/recurring/on bactrim    Past Surgical History  Procedure Laterality Date  . Back surgery  1985  . Tonsillectomy    . Appendectomy    . Dilation and curettage of uterus    . Colonoscopy    . Eye surgery  08    cataract surgery of the left eye  . Ercp    . Abdominal hysterectomy  1972  . Cardiac catheterization  1995.,2006,1997    stent to the proximal RCA after MI   . Myringotomy  2012    both ears  . Hardware removal Right 01/17/2013    Procedure: REMOVAL OF HARDWARE AND EXCISION ULNAR STYLOID RIGHT WRIST;  Surgeon: Tennis Must, MD;  Location: [facility];  Service: Orthopedics;  Laterality: Right;  . Wrist surgery      rt wrist hardwear removal  . Esophagogastroduodenoscopy N/A 04/01/2013    Procedure: ESOPHAGOGASTRODUODENOSCOPY (EGD);  Surgeon: Rogene Houston, MD;  Location: [facility];  Service: Endoscopy;  Laterality: N/A;  230-rescheduled to 8:30am Ann notified pt  . Esophageal banding N/A 04/01/2013    Procedure: ESOPHAGEAL BANDING;  Surgeon: Rogene Houston, MD;  Location: [facility];  Service: Endoscopy;  Laterality: N/A;  . Esophagogastroduodenoscopy N/A 05/24/2013    Procedure: ESOPHAGOGASTRODUODENOSCOPY (EGD);  Surgeon: Rogene Houston, MD;  Location: [facility];  Service: Endoscopy;  Laterality: N/A;  730  . Esophageal banding N/A 05/24/2013    Procedure: ESOPHAGEAL BANDING;  Surgeon: Rogene Houston, MD;  Location: [facility];  Service: Endoscopy;  Laterality: N/A;    Family History  Problem Relation Age of Onset  . Diabetes Mother   . Heart failure Father     Social History:  reports that she quit smoking about 2 years ago. Her smoking use included Cigarettes. She smoked 0.25 packs per day. She has never used smokeless tobacco. She reports that she does not drink alcohol or use illicit drugs.  Allergies:    Allergies  Allergen Reactions  . Tape Rash    ADHESIVE TAPE    Medications: Prior to Admission medications   Medication Sig Start Date End Date Taking? Authorizing Provider  aspirin 81 MG tablet Take 81 mg by mouth daily.   Yes Historical Provider, MD  carvedilol (COREG) 12.5 MG tablet Take 1 tablet (12.5 mg total) by mouth 2 (two) times daily. 12/09/13  Yes Cecilie Kicks, NP  diphenhydrAMINE (BENADRYL) 50 MG tablet Take 50 mg by mouth at bedtime as needed for itching or sleep.   Yes Historical Provider, MD  insulin glargine (LANTUS SOLOSTAR) 100 UNIT/ML injection Inject 72 Units into the skin at bedtime.    Yes Historical Provider, MD  insulin NPH-insulin regular (NOVOLIN 70/30) (70-30) 100 UNIT/ML injection Inject 72 Units into the skin 2 (two) times daily with a meal.    Yes Historical Provider, MD  isosorbide mononitrate (IMDUR) 30 MG 24 hr tablet Take 1 tablet (30 mg total) by mouth daily. 05/10/13  Yes  Pixie Casino, MD  omeprazole (PRILOSEC OTC) 20 MG tablet Take 20 mg by mouth daily.   Yes Historical Provider, MD  rosuvastatin (CRESTOR) 40 MG tablet Take 1 tablet (40 mg total) by mouth at bedtime. 07/29/13  Yes Pixie Casino, MD  sulfamethoxazole-trimethoprim (BACTRIM DS) 800-160 MG per tablet Take 1 tablet by mouth daily. 03/03/14  Yes Historical Provider, MD  valsartan-hydrochlorothiazide (DIOVAN-HCT) 160-12.5 MG per tablet Take 1 tablet by mouth daily.   Yes Historical Provider, MD  zolpidem (AMBIEN) 5 MG tablet Take 5 mg by mouth at bedtime as needed for sleep.   Yes Historical Provider, MD    Scheduled Meds: . [START ON 03/07/2014] aspirin  325 mg Oral Daily  . atorvastatin  40 mg Oral q1800  . enoxaparin (LOVENOX) injection  40 mg Subcutaneous Q24H  . insulin aspart  0-15 Units Subcutaneous TID WC  . insulin aspart  0-5 Units Subcutaneous QHS  . insulin glargine  36 Units Subcutaneous QHS  . [START ON 03/07/2014] isosorbide mononitrate  30 mg Oral Daily  . pantoprazole  40  mg Oral Daily  . [START ON 03/07/2014] sulfamethoxazole-trimethoprim  1 tablet Oral Daily   Continuous Infusions:  PRN Meds:.senna-docusate, zolpidem   Blood pressure 139/65, pulse 85, temperature 98.5 F (36.9 C), temperature source Oral, resp. rate 20, height 5' 3"  (1.6 m), weight 92.987 kg (205 lb), SpO2 93.00%.   Results for orders placed during the hospital encounter of 03/06/14 (from the past 48 hour(s))  CBC WITH DIFFERENTIAL     Status: Abnormal   Collection Time    03/06/14  9:07 AM      Result Value Ref Range   WBC 7.0  4.0 - 10.5 K/uL   RBC 3.72 (*) 3.87 - 5.11 MIL/uL   Hemoglobin 11.4 (*) 12.0 - 15.0 g/dL   HCT 33.9 (*) 36.0 - 46.0 %   MCV 91.1  78.0 - 100.0 fL   MCH 30.6  26.0 - 34.0 pg   MCHC 33.6  30.0 - 36.0 g/dL   RDW 14.4  11.5 - 15.5 %   Platelets 149 (*) 150 - 400 K/uL   Neutrophils Relative % 74  43 - 77 %   Neutro Abs 5.2  1.7 - 7.7 K/uL   Lymphocytes Relative 15  12 - 46 %   Lymphs Abs 1.0  0.7 - 4.0 K/uL   Monocytes Relative 9  3 - 12 %   Monocytes Absolute 0.6  0.1 - 1.0 K/uL   Eosinophils Relative 2  0 - 5 %   Eosinophils Absolute 0.2  0.0 - 0.7 K/uL   Basophils Relative 0  0 - 1 %   Basophils Absolute 0.0  0.0 - 0.1 K/uL  COMPREHENSIVE METABOLIC PANEL     Status: Abnormal   Collection Time    03/06/14  9:07 AM      Result Value Ref Range   Sodium 139  137 - 147 mEq/L   Potassium 4.0  3.7 - 5.3 mEq/L   Chloride 102  96 - 112 mEq/L   CO2 22  19 - 32 mEq/L   Glucose, Bld 177 (*) 70 - 99 mg/dL   BUN 27 (*) 6 - 23 mg/dL   Creatinine, Ser 1.21 (*) 0.50 - 1.10 mg/dL   Calcium 9.3  8.4 - 10.5 mg/dL   Total Protein 6.8  6.0 - 8.3 g/dL   Albumin 3.4 (*) 3.5 - 5.2 g/dL   AST 30  0 - 37 U/L  ALT 25  0 - 35 U/L   Alkaline Phosphatase 108  39 - 117 U/L   Total Bilirubin 0.5  0.3 - 1.2 mg/dL   GFR calc non Af Amer 44 (*) >90 mL/min   GFR calc Af Amer 51 (*) >90 mL/min   Comment: (NOTE)     The eGFR has been calculated using the CKD EPI equation.      This calculation has not been validated in all clinical situations.     eGFR's persistently <90 mL/min signify possible Chronic Kidney     Disease.  TROPONIN I     Status: None   Collection Time    03/06/14  9:07 AM      Result Value Ref Range   Troponin I <0.30  <0.30 ng/mL   Comment:            Due to the release kinetics of cTnI,     a negative result within the first hours     of the onset of symptoms does not rule out     myocardial infarction with certainty.     If myocardial infarction is still suspected,     repeat the test at appropriate intervals.  PROTIME-INR     Status: None   Collection Time    03/06/14  9:07 AM      Result Value Ref Range   Prothrombin Time 15.0  11.6 - 15.2 seconds   INR 1.18  0.00 - 1.49  APTT     Status: None   Collection Time    03/06/14  9:07 AM      Result Value Ref Range   aPTT 29  24 - 37 seconds  LIPID PANEL     Status: Abnormal   Collection Time    03/06/14  9:07 AM      Result Value Ref Range   Cholesterol 215 (*) 0 - 200 mg/dL   Triglycerides 129  <150 mg/dL   HDL 42  >39 mg/dL   Total CHOL/HDL Ratio 5.1     VLDL 26  0 - 40 mg/dL   LDL Cholesterol 147 (*) 0 - 99 mg/dL   Comment:            Total Cholesterol/HDL:CHD Risk     Coronary Heart Disease Risk Table                         Men   Women      1/2 Average Risk   3.4   3.3      Average Risk       5.0   4.4      2 X Average Risk   9.6   7.1      3 X Average Risk  23.4   11.0                Use the calculated Patient Ratio     above and the CHD Risk Table     to determine the patient's CHD Risk.                ATP III CLASSIFICATION (LDL):      <100     mg/dL   Optimal      100-129  mg/dL   Near or Above                        Optimal      130-159  mg/dL   Borderline      160-189  mg/dL   High      >190     mg/dL   Very High  URINALYSIS, ROUTINE W REFLEX MICROSCOPIC     Status: Abnormal   Collection Time    03/06/14 10:42 AM      Result Value Ref Range   Color,  Urine YELLOW  YELLOW   APPearance CLEAR  CLEAR   Specific Gravity, Urine 1.010  1.005 - 1.030   pH 5.5  5.0 - 8.0   Glucose, UA NEGATIVE  NEGATIVE mg/dL   Hgb urine dipstick NEGATIVE  NEGATIVE   Bilirubin Urine NEGATIVE  NEGATIVE   Ketones, ur NEGATIVE  NEGATIVE mg/dL   Protein, ur NEGATIVE  NEGATIVE mg/dL   Urobilinogen, UA 0.2  0.0 - 1.0 mg/dL   Nitrite NEGATIVE  NEGATIVE   Leukocytes, UA SMALL (*) NEGATIVE  URINE MICROSCOPIC-ADD ON     Status: Abnormal   Collection Time    03/06/14 10:42 AM      Result Value Ref Range   Squamous Epithelial / LPF FEW (*) RARE   WBC, UA 3-6  <3 WBC/hpf   Bacteria, UA FEW (*) RARE  GLUCOSE, CAPILLARY     Status: Abnormal   Collection Time    03/06/14  4:58 PM      Result Value Ref Range   Glucose-Capillary 130 (*) 70 - 99 mg/dL   Comment 1 Notify RN     Comment 2 Documented in Chart      Dg Chest 2 View  03/06/2014   CLINICAL DATA:  Cerebrovascular accident.  EXAM: CHEST  2 VIEW  COMPARISON:  March 25, 2013.  FINDINGS: The heart size and mediastinal contours are within normal limits. Both lungs are clear. No pneumothorax or pleural effusion is noted. The visualized skeletal structures are unremarkable.  IMPRESSION: No acute cardiopulmonary abnormality seen.   Electronically Signed   By: Sabino Dick M.D.   On: 03/06/2014 16:19   Ct Head Wo Contrast  03/06/2014   ADDENDUM REPORT: 03/06/2014 11:53  ADDENDUM: The small amount of increased density in the sylvian fissure is consistent with intraluminal thrombus in light of the subsequent MRI findings. It is not now felt to reflect acute blood.   Electronically Signed   By: David  Martinique   On: 03/06/2014 11:53   03/06/2014   CLINICAL DATA:  Status post multiple falls; left-sided facial droop  EXAM: CT HEAD WITHOUT CONTRAST  TECHNIQUE: Contiguous axial images were obtained from the base of the skull through the vertex without intravenous contrast.  COMPARISON:  Noncontrast CT scan of the brain of February 24, 2012.  FINDINGS: There is new subtle decreased density within the right frontal lobe posteriorly. There is a tiny amount of increased density within the sylvian fissure on the right which is new and may reflect small amounts of blood. There is stable bifrontal cerebral atrophy. There is no shift of the midline. Old lacunar infarctions in the basal ganglia are present bilaterally. The cerebellum and brainstem are normal in density.  At bone window settings the observed portions of the paranasal sinuses and mastoid air cells are clear. There is no acute skull fracture.  IMPRESSION: 1. The findings are consistent with early ischemic change in the posterior aspect of the right frontal lobe. A tiny amount of acute blood within or adjacent to the sylvian fissure on the right is present. 2. There is no CT evidence of significantly increased  intracranial pressure. 3. No acute skull fracture is demonstrated. 4. Critical Value/emergent results were called by telephone at the time of interpretation on 03/06/2014 at 10:11 AM to Dr. Fredia Sorrow , who verbally acknowledged these results.  Electronically Signed: By: David  Martin[place] On: 03/06/2014 10:12   Mr Brain Wo Contrast  03/06/2014   CLINICAL DATA:  Left facial droop.  Aphasia  EXAM: MRI HEAD WITHOUT CONTRAST  TECHNIQUE: Multiplanar, multiecho pulse sequences of the brain and surrounding structures were obtained without intravenous contrast.  COMPARISON:  CT head 03/06/2014  FINDINGS: Acute infarct in the right frontal operculum with some extension into the posterior insula. No other areas of acute infarct are identified.  Chronic microvascular ischemic changes in the white matter. Chronic infarcts in the left thalamus and pons bilaterally.  Negative for hemorrhage. Hyperdensity in the right sylvian fissure on the CT felt to be clot in the right MCA. There is slow flow or occlusion in the right parietal branch of the MCA on FLAIR imaging.  Ventricle size is normal. No  shift of the midline structures. Negative for mass lesion.  Mastoid sinus effusion bilaterally.  IMPRESSION: Acute infarct in the right frontal operculum and right insular cortex with decreased flow or occlusion of right middle cerebral artery branch supplying this area. Negative for hemorrhagic transformation.   Electronically Signed   By: Franchot Gallo M.D.   On: 03/06/2014 11:54   US Carotid Bilateral  03/06/2014   CLINICAL DATA:  Acute right hemispheric cerebral infarction. History of coronary artery disease, myocardial infarction, hyperlipidemia, diabetes and smoking.  EXAM: BILATERAL CAROTID DUPLEX ULTRASOUND  TECHNIQUE: Pearline Cables scale imaging, color Doppler and duplex ultrasound were performed of bilateral carotid and vertebral arteries in the neck.  COMPARISON:  05/14/2010  FINDINGS: Criteria: Quantification of carotid stenosis is based on velocity parameters that correlate the residual internal carotid diameter with NASCET-based stenosis levels, using the diameter of the distal internal carotid lumen as the denominator for stenosis measurement.  The following velocity measurements were obtained:  RIGHT  ICA:  132/29 cm/sec  CCA:  496/75 cm/sec  SYSTOLIC ICA/CCA RATIO:  1.3  DIASTOLIC ICA/CCA RATIO:  2.3  ECA:  183 cm/sec  LEFT  ICA:  197/22 cm/sec  CCA:  916/38 cm/sec  SYSTOLIC ICA/CCA RATIO:  1.6  DIASTOLIC ICA/CCA RATIO:  1.6  ECA:  301 cm/sec  RIGHT CAROTID ARTERY: A mild amount of predominately calcified plaque is present at the level of the distal bulb and proximal ICA. Proximal velocities and waveforms are unremarkable and estimated ICA stenosis is less than 50%. Mildly elevated velocities in the higher internal carotid segment within the upper neck noted at a level of mild tortuosity without focal plaque.  RIGHT VERTEBRAL ARTERY: Antegrade flow with normal waveform and velocity.  LEFT CAROTID ARTERY: Stable moderate plaque burden at the level of the distal bulb and extending up to the ICA origin.  This causes luminal stenosis with turbulent flow. Estimated proximal left ICA stenosis is 50- 69%.  LEFT VERTEBRAL ARTERY: Antegrade flow with normal waveform and velocity.  IMPRESSION: Bilateral atherosclerotic plaque at the level of the distal carotid bulbs and proximal internal carotid arteries. Estimated right ICA stenosis is less than 50%. Estimated left ICA stenosis is 50- 69%.   Electronically Signed   By: Aletta Edouard M.D.   On: 03/06/2014 16:42        Raphaella Larkin A. Merlene Laughter, M.D.  Diplomate, Tax adviser of Psychiatry and Neurology ( Neurology). 03/06/2014, 7:34 PM

## 2014-03-06 NOTE — H&P (Signed)
Patient seen, independently examined and chart reviewed. I agree with exam, assessment and plan discussed with Dyanne Carrel, NP.  73 year old woman who noticed difficulty holding pills in her hands last night. No paresthesias. No lower extremity weakness. She also noticed her voice sounded odd but she did not notice facial weakness. She felt worse today and so came to the emergency department where imaging revealed acute stroke. EDP discussed with Dr. Merlene Laughter who reviewed imaging and recommended admission here to [facility].  Currently she has no paresthesias and she feels that her difficulty manipulating objects with her hands has resolved. She does note left-sided facial weakness and an odd quality to her voice.  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 No hypoxia.  Gen.  she appears calm and comfortable.    Psych. alert. Grossly normal mentation. Speech fluent and appropriate.  Eyes. pupils round, equal. Lids appear unremarkable.  ENT. lips and tongue appear unremarkable.  Neck. appears grossly unremarkable    Cardiovascular. regular rate and rhythm. No murmur, rub or gallop. No lower extremity edema.    Respiratory. clear to auscultation bilaterally. No wheezes, rales or rhonchi. Normal respiratory effort.    Abdomen. soft, nontender, nondistended  Skin. appears grossly unremarkable    Musculoskeletal. grossly normal tone and strength in all extremities, symmetric.  Neurologic. Grossly normal sensation. She has obvious left-sided facial weakness that involves the forehead. She is able to close her left eye fully. No upper extremity dysdiadochokinesis. No pronator drift.     Chemistry: Basic metabolic panel notable for BUN 27, creatinine 1.21. Troponin negative. LDL 147.  Heme: Hemoglobin stable 11.4.  Other: EKG sinus rhythm, first  degree AV block,  anteroseptal MI, old. Compared to previous study 12/09/2013, no acute changes seen.  Imaging: MRI of the brain, CT of the head revealed acute infarct right frontal operculum. No evidence of hemorrhage  Currently stable. Plan admission to telemetry for further evaluation of acute stroke, favor thrombotic. For now continue aspirin pending further evaluation. Continue statin therapy.    Dehydration. likely related to diuretics. Monitor clinically.    Possible UTI, followup urine culture. Continue suppressive Bactrim for now.   Murray Hodgkins, MD [facility] [phone]

## 2014-03-06 NOTE — ED Provider Notes (Signed)
CSN: [id number]     Arrival date & time 03/06/14  8115 History  This chart was scribed for Fredia Sorrow, MD by Elby Beck, ED Scribe. This patient was seen in room [facility]05/[facility]05 and the patient's care was started at 8:14 AM.    Chief Complaint  Patient presents with  . Aphasia    Patient is a 73 y.o. female presenting with general illness. The history is provided by the patient. No language interpreter was used.  Illness Location:  Left-sided facial droop Severity:  Moderate Onset quality:  Gradual Duration:  9 hours Timing:  Constant Progression:  Unchanged Chronicity:  New Context:  No history of similar symptoms Relieved by:  Nothing tried Worsened by:  Nothing tried Associated symptoms: sore throat   Associated symptoms: no abdominal pain, no chest pain, no cough, no diarrhea, no fever, no headaches, no nausea, no rash, no rhinorrhea, no shortness of breath and no vomiting     HPI Comments: [name] is a 73 y.o. female who presents to the Emergency Department complaining of left-sided facial droop with associated slurred speech onset about 9 hours ago, at 11:30 PM. She states that she had associated difficulty gripping objects which has now resolved. She denies any numbness, weakness or any other symptoms in her lower extremities. She denies any history of similar symptoms. She denies any associated headache, fever, neck pain or any other symptoms.    Past Medical History  Diagnosis Date  . Back pain   . GERD (gastroesophageal reflux disease)   . MI (myocardial infarction)   . Hypercholesteremia   . Diabetes mellitus   . Anemia   . Cirrhosis   . Aortic sclerosis 07/04/2011  . Aortic insufficiency 07/04/2011    most recent 2D Echo 02/24/2012  EF greater than 55%  . Carotid stenosis, bilateral 07/04/2011    most recent 05/2010 carotid doppler  . OSA (obstructive sleep apnea) 07/04/2011    sleep study 2009  AHI 9.91/hr and during REM sleep 35.58/hr  . Presence of  stent in right coronary artery 07/04/2011  . HTN (hypertension) 07/04/2011  . Coronary artery disease   . Myocardial infarction 1995  . GERD (gastroesophageal reflux disease)   . Non-alcoholic fatty liver disease    Past Surgical History  Procedure Laterality Date  . Back surgery  1985  . Tonsillectomy    . Appendectomy    . Dilation and curettage of uterus    . Colonoscopy    . Eye surgery  08    cataract surgery of the left eye  . Ercp    . Abdominal hysterectomy  1972  . Cardiac catheterization  1995.,2006,1997    stent to the proximal RCA after MI   . Myringotomy  2012    both ears  . Hardware removal Right 01/17/2013    Procedure: REMOVAL OF HARDWARE AND EXCISION ULNAR STYLOID RIGHT WRIST;  Surgeon: Tennis Must, MD;  Location: [facility];  Service: Orthopedics;  Laterality: Right;  . Wrist surgery      rt wrist hardwear removal  . Esophagogastroduodenoscopy N/A 04/01/2013    Procedure: ESOPHAGOGASTRODUODENOSCOPY (EGD);  Surgeon: Rogene Houston, MD;  Location: [facility];  Service: Endoscopy;  Laterality: N/A;  230-rescheduled to 8:30am Ann notified pt  . Esophageal banding N/A 04/01/2013    Procedure: ESOPHAGEAL BANDING;  Surgeon: Rogene Houston, MD;  Location: [facility];  Service: Endoscopy;  Laterality: N/A;  . Esophagogastroduodenoscopy N/A 05/24/2013    Procedure:  ESOPHAGOGASTRODUODENOSCOPY (EGD);  Surgeon: Rogene Houston, MD;  Location: [facility];  Service: Endoscopy;  Laterality: N/A;  730  . Esophageal banding N/A 05/24/2013    Procedure: ESOPHAGEAL BANDING;  Surgeon: Rogene Houston, MD;  Location: [facility];  Service: Endoscopy;  Laterality: N/A;   Family History  Problem Relation Age of Onset  . Diabetes Mother   . Heart failure Father    History  Substance Use Topics  . Smoking status: Former Smoker -- 0.25 packs/day    Types: Cigarettes    Quit date: 01/13/2012  . Smokeless tobacco: Never Used  . Alcohol Use: No   OB  History   Grav Para Term Preterm Abortions TAB SAB Ect Mult Living                 Review of Systems  Constitutional: Negative for fever and chills.  HENT: Positive for sore throat. Negative for rhinorrhea.   Eyes: Negative for visual disturbance.  Respiratory: Negative for cough and shortness of breath.   Cardiovascular: Negative for chest pain and leg swelling.  Gastrointestinal: Negative for nausea, vomiting, abdominal pain and diarrhea.  Genitourinary: Negative for dysuria.  Musculoskeletal: Negative for back pain and neck pain.  Skin: Negative for rash.  Neurological: Positive for facial asymmetry and speech difficulty. Negative for dizziness, light-headedness and headaches.  Hematological: Does not bruise/bleed easily.  Psychiatric/Behavioral: Negative for confusion.    Allergies  Tape  Home Medications   Prior to Admission medications   Medication Sig Start Date End Date Taking? Authorizing Provider  aspirin 81 MG tablet Take 81 mg by mouth daily.   Yes Historical Provider, MD  carvedilol (COREG) 12.5 MG tablet Take 1 tablet (12.5 mg total) by mouth 2 (two) times daily. 12/09/13  Yes Cecilie Kicks, NP  diphenhydrAMINE (BENADRYL) 50 MG tablet Take 50 mg by mouth at bedtime as needed for itching or sleep.   Yes Historical Provider, MD  insulin glargine (LANTUS SOLOSTAR) 100 UNIT/ML injection Inject 72 Units into the skin at bedtime.    Yes Historical Provider, MD  insulin NPH-insulin regular (NOVOLIN 70/30) (70-30) 100 UNIT/ML injection Inject 72 Units into the skin 2 (two) times daily with a meal.    Yes Historical Provider, MD  isosorbide mononitrate (IMDUR) 30 MG 24 hr tablet Take 1 tablet (30 mg total) by mouth daily. 05/10/13  Yes Pixie Casino, MD  omeprazole (PRILOSEC OTC) 20 MG tablet Take 20 mg by mouth daily.   Yes Historical Provider, MD  rosuvastatin (CRESTOR) 40 MG tablet Take 1 tablet (40 mg total) by mouth at bedtime. 07/29/13  Yes Pixie Casino, MD   sulfamethoxazole-trimethoprim (BACTRIM DS) 800-160 MG per tablet Take 1 tablet by mouth daily. 03/03/14  Yes Historical Provider, MD  valsartan-hydrochlorothiazide (DIOVAN-HCT) 160-12.5 MG per tablet Take 1 tablet by mouth daily.   Yes Historical Provider, MD  zolpidem (AMBIEN) 5 MG tablet Take 5 mg by mouth at bedtime as needed for sleep.   Yes Historical Provider, MD   Triage Vitals: BP 142/49  Pulse 87  Temp(Src) 98.9 F (37.2 C) (Oral)  Resp 18  Ht 5' 3"  (1.6 m)  Wt 205 lb (92.987 kg)  BMI 36.32 kg/m2  SpO2 97%  Physical Exam  Nursing note and vitals reviewed. Constitutional: She is oriented to person, place, and time. She appears well-developed and well-nourished. No distress.  HENT:  Head: Normocephalic and atraumatic.  Eyes: Conjunctivae and EOM are normal.  Neck: Neck supple. No tracheal  deviation present.  Cardiovascular: Normal rate, regular rhythm and normal heart sounds.   Pulmonary/Chest: Effort normal and breath sounds normal. No respiratory distress. She has no wheezes. She has no rales.  Lungs CTA bilaterally  Abdominal: Soft. Bowel sounds are normal. There is no tenderness.  Musculoskeletal: Normal range of motion. She exhibits no edema.  No swelling in ankles.  Neurological: She is alert and oriented to person, place, and time.  Able to stick out tongue on both sides, but deviates to the left. Tighter on the right side with closing her eyes. No movement on the left side of the face. Weakness of the facial nerve on the left side. More predominant around mouth and forehead. Some movement on the forehead, but there is numbness to that area  Skin: Skin is warm and dry.  Psychiatric: She has a normal mood and affect. Her behavior is normal.    ED Course  Procedures (including critical care time)  DIAGNOSTIC STUDIES: Oxygen Saturation is 97% on RA, normal by my interpretation.    COORDINATION OF CARE: 8:19 AM- Will order a CT of pt's head, along with a CBC and  CMP. Will also order an EKG and IV fluids. Pt advised of plan for treatment and pt agrees.  Medications  0.9 %  sodium chloride infusion (not administered)  0.9 %  sodium chloride infusion (not administered)  sodium chloride 0.9 % bolus 250 mL (250 mLs Intravenous New Bag/Given 03/06/14 0916)  aspirin chewable tablet 324 mg (324 mg Oral Given 03/06/14 1248)   Results for orders placed during the hospital encounter of 03/06/14  CBC WITH DIFFERENTIAL      Result Value Ref Range   WBC 7.0  4.0 - 10.5 K/uL   RBC 3.72 (*) 3.87 - 5.11 MIL/uL   Hemoglobin 11.4 (*) 12.0 - 15.0 g/dL   HCT 33.9 (*) 36.0 - 46.0 %   MCV 91.1  78.0 - 100.0 fL   MCH 30.6  26.0 - 34.0 pg   MCHC 33.6  30.0 - 36.0 g/dL   RDW 14.4  11.5 - 15.5 %   Platelets 149 (*) 150 - 400 K/uL   Neutrophils Relative % 74  43 - 77 %   Neutro Abs 5.2  1.7 - 7.7 K/uL   Lymphocytes Relative 15  12 - 46 %   Lymphs Abs 1.0  0.7 - 4.0 K/uL   Monocytes Relative 9  3 - 12 %   Monocytes Absolute 0.6  0.1 - 1.0 K/uL   Eosinophils Relative 2  0 - 5 %   Eosinophils Absolute 0.2  0.0 - 0.7 K/uL   Basophils Relative 0  0 - 1 %   Basophils Absolute 0.0  0.0 - 0.1 K/uL  COMPREHENSIVE METABOLIC PANEL      Result Value Ref Range   Sodium 139  137 - 147 mEq/L   Potassium 4.0  3.7 - 5.3 mEq/L   Chloride 102  96 - 112 mEq/L   CO2 22  19 - 32 mEq/L   Glucose, Bld 177 (*) 70 - 99 mg/dL   BUN 27 (*) 6 - 23 mg/dL   Creatinine, Ser 1.21 (*) 0.50 - 1.10 mg/dL   Calcium 9.3  8.4 - 10.5 mg/dL   Total Protein 6.8  6.0 - 8.3 g/dL   Albumin 3.4 (*) 3.5 - 5.2 g/dL   AST 30  0 - 37 U/L   ALT 25  0 - 35 U/L   Alkaline Phosphatase 108  39 - 117 U/L   Total Bilirubin 0.5  0.3 - 1.2 mg/dL   GFR calc non Af Amer 44 (*) >90 mL/min   GFR calc Af Amer 51 (*) >90 mL/min  URINALYSIS, ROUTINE W REFLEX MICROSCOPIC      Result Value Ref Range   Color, Urine YELLOW  YELLOW   APPearance CLEAR  CLEAR   Specific Gravity, Urine 1.010  1.005 - 1.030   pH 5.5  5.0 -  8.0   Glucose, UA NEGATIVE  NEGATIVE mg/dL   Hgb urine dipstick NEGATIVE  NEGATIVE   Bilirubin Urine NEGATIVE  NEGATIVE   Ketones, ur NEGATIVE  NEGATIVE mg/dL   Protein, ur NEGATIVE  NEGATIVE mg/dL   Urobilinogen, UA 0.2  0.0 - 1.0 mg/dL   Nitrite NEGATIVE  NEGATIVE   Leukocytes, UA SMALL (*) NEGATIVE  TROPONIN I      Result Value Ref Range   Troponin I <0.30  <0.30 ng/mL  PROTIME-INR      Result Value Ref Range   Prothrombin Time 15.0  11.6 - 15.2 seconds   INR 1.18  0.00 - 1.49  APTT      Result Value Ref Range   aPTT 29  24 - 37 seconds  URINE MICROSCOPIC-ADD ON      Result Value Ref Range   Squamous Epithelial / LPF FEW (*) RARE   WBC, UA 3-6  <3 WBC/hpf   Bacteria, UA FEW (*) RARE   Ct Head Wo Contrast  03/06/2014   ADDENDUM REPORT: 03/06/2014 11:53  ADDENDUM: The small amount of increased density in the sylvian fissure is consistent with intraluminal thrombus in light of the subsequent MRI findings. It is not now felt to reflect acute blood.   Electronically Signed   By: David  Martinique   On: 03/06/2014 11:53   03/06/2014   CLINICAL DATA:  Status post multiple falls; left-sided facial droop  EXAM: CT HEAD WITHOUT CONTRAST  TECHNIQUE: Contiguous axial images were obtained from the base of the skull through the vertex without intravenous contrast.  COMPARISON:  Noncontrast CT scan of the brain of February 24, 2012.  FINDINGS: There is new subtle decreased density within the right frontal lobe posteriorly. There is a tiny amount of increased density within the sylvian fissure on the right which is new and may reflect small amounts of blood. There is stable bifrontal cerebral atrophy. There is no shift of the midline. Old lacunar infarctions in the basal ganglia are present bilaterally. The cerebellum and brainstem are normal in density.  At bone window settings the observed portions of the paranasal sinuses and mastoid air cells are clear. There is no acute skull fracture.  IMPRESSION: 1. The  findings are consistent with early ischemic change in the posterior aspect of the right frontal lobe. A tiny amount of acute blood within or adjacent to the sylvian fissure on the right is present. 2. There is no CT evidence of significantly increased intracranial pressure. 3. No acute skull fracture is demonstrated. 4. Critical Value/emergent results were called by telephone at the time of interpretation on 03/06/2014 at 10:11 AM to Dr. Fredia Sorrow , who verbally acknowledged these results.  Electronically Signed: By: David  Martinique On: 03/06/2014 10:12   Mr Brain Wo Contrast  03/06/2014   CLINICAL DATA:  Left facial droop.  Aphasia  EXAM: MRI HEAD WITHOUT CONTRAST  TECHNIQUE: Multiplanar, multiecho pulse sequences of the brain and surrounding structures were obtained without intravenous contrast.  COMPARISON:  CT head  03/06/2014  FINDINGS: Acute infarct in the right frontal operculum with some extension into the posterior insula. No other areas of acute infarct are identified.  Chronic microvascular ischemic changes in the white matter. Chronic infarcts in the left thalamus and pons bilaterally.  Negative for hemorrhage. Hyperdensity in the right sylvian fissure on the CT felt to be clot in the right MCA. There is slow flow or occlusion in the right parietal branch of the MCA on FLAIR imaging.  Ventricle size is normal. No shift of the midline structures. Negative for mass lesion.  Mastoid sinus effusion bilaterally.  IMPRESSION: Acute infarct in the right frontal operculum and right insular cortex with decreased flow or occlusion of right middle cerebral artery branch supplying this area. Negative for hemorrhagic transformation.   Electronically Signed   By: Franchot Gallo M.D.   On: 03/06/2014 11:54     EKG Interpretation None     EKG ordered but not yet done. Admission already arranged.  CRITICAL CARE Performed by: Fredia Sorrow Total critical care time: 30 Critical care time was exclusive of  separately billable procedures and treating other patients. Critical care was necessary to treat or prevent imminent or life-threatening deterioration. Critical care was time spent personally by me on the following activities: development of treatment plan with patient and/or surrogate as well as nursing, discussions with consultants, evaluation of patient's response to treatment, examination of patient, obtaining history from patient or surrogate, ordering and performing treatments and interventions, ordering and review of laboratory studies, ordering and review of radiographic studies, pulse oximetry and re-evaluation of patient's condition.    MDM   Final diagnoses:  CVA (cerebral vascular accident)    Patient with left-sided facial droop right frontal infarct. MRI without evidence of blood. CT original he made mention of that. I think probably with a sole now is a clot on the CT. Discussed with neurology they agree with admission here. The onset of symptoms were around 11:30 last night. Patient will be admitted to the hospitalist service telemetry. Patient had head CT that MRI. Based on her presentation was clinically very suspicious that she did have a stroke. There was some question whether it was more related to a Bell's palsy on left side she did have some weakness on the left for head but no movement along the left side of face.   I personally performed the services described in this documentation, which was scribed in my presence. The recorded information has been reviewed and is accurate.    Fredia Sorrow, MD 03/06/14 1311

## 2014-03-06 NOTE — ED Notes (Signed)
Hospitalist NP in with pt at this time

## 2014-03-06 NOTE — Progress Notes (Deleted)
Patient seen, independently examined and chart reviewed. I agree with exam, assessment and plan discussed with Dyanne Carrel, NP.  73 year old woman who noticed difficulty holding pills in her hands last night. No paresthesias. No lower extremity weakness. She also noticed her voice sounded odd but she did not notice facial weakness. She felt worse today and so came to the emergency department where imaging revealed acute stroke. EDP discussed with Dr. Merlene Laughter who reviewed imaging and recommended admission here to [facility].  Currently she has no paresthesias and she feels that her difficulty manipulating objects with her hands has resolved. She does note left-sided facial weakness and an odd quality to her voice.  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 No hypoxia.  Gen.  she appears calm and comfortable.   Psych.  alert. Grossly normal mentation. Speech fluent and appropriate.  Eyes.  pupils round, equal. Lids appear unremarkable.  ENT.  lips and tongue appear unremarkable.  Neck.  appears grossly unremarkable   Cardiovascular.  regular rate and rhythm. No murmur, rub or gallop. No lower extremity edema.   Respiratory.  clear to auscultation bilaterally. No wheezes, rales or rhonchi. Normal respiratory effort.   Abdomen.  soft, nontender, nondistended  Skin.  appears grossly unremarkable   Musculoskeletal.  grossly normal tone and strength in all extremities, symmetric.  Neurologic. Grossly normal sensation. She has obvious left-sided facial weakness that involves the forehead. She is able to close her left eye fully. No upper extremity dysdiadochokinesis. No pronator drift.    Chemistry: Basic metabolic panel notable for BUN 27, creatinine 1.21. Troponin negative. LDL 147.  Heme: Hemoglobin stable 11.4.  Other: EKG sinus rhythm, first  degree AV block,  anteroseptal MI, old. Compared to previous study 12/09/2013, no acute changes seen.  Imaging: MRI of the brain, CT of the head revealed acute infarct right frontal operculum. No evidence of hemorrhage  Currently stable. Plan admission to telemetry for further evaluation of acute stroke, favor thrombotic. For now continue aspirin pending further evaluation. Continue statin therapy.  Dehydration. likely related to diuretics. Monitor clinically.  Possible UTI, followup urine culture. Continue suppressive Bactrim for now.   Murray Hodgkins, MD [facility] [phone]

## 2014-03-06 NOTE — ED Notes (Addendum)
Pt states started having left side droop and slurred speech started last night at 1130 am. Pt denies weakness,headache or any other symptoms. Neuro check at triage negative. Pt has obvious droop and moderate slurred speech noted. Alert/oriented. cbg in route 161. Pupils perrla. Diarrhea started last night x 2. States had "hand, foot, mouth" disease last month

## 2014-03-06 NOTE — ED Notes (Signed)
edp aware of pt. vo not to start protocol orders

## 2014-03-07 DIAGNOSIS — E1149 Type 2 diabetes mellitus with other diabetic neurological complication: Secondary | ICD-10-CM

## 2014-03-07 DIAGNOSIS — I359 Nonrheumatic aortic valve disorder, unspecified: Secondary | ICD-10-CM

## 2014-03-07 DIAGNOSIS — N179 Acute kidney failure, unspecified: Secondary | ICD-10-CM

## 2014-03-07 DIAGNOSIS — I639 Cerebral infarction, unspecified: Secondary | ICD-10-CM

## 2014-03-07 HISTORY — DX: Cerebral infarction, unspecified: I63.9

## 2014-03-07 LAB — GLUCOSE, CAPILLARY
Glucose-Capillary: 116 mg/dL — ABNORMAL HIGH (ref 70–99)
Glucose-Capillary: 131 mg/dL — ABNORMAL HIGH (ref 70–99)

## 2014-03-07 LAB — BASIC METABOLIC PANEL
BUN: 17 mg/dL (ref 6–23)
CALCIUM: 8.7 mg/dL (ref 8.4–10.5)
CO2: 23 mEq/L (ref 19–32)
CREATININE: 0.95 mg/dL (ref 0.50–1.10)
Chloride: 105 mEq/L (ref 96–112)
GFR calc Af Amer: 68 mL/min — ABNORMAL LOW (ref >90)
GFR calc non Af Amer: 59 mL/min — ABNORMAL LOW (ref >90)
GLUCOSE: 155 mg/dL — AB (ref 70–99)
Potassium: 4 mEq/L (ref 3.7–5.3)
Sodium: 141 mEq/L (ref 137–147)

## 2014-03-07 LAB — URINE CULTURE
COLONY COUNT: NO GROWTH
CULTURE: NO GROWTH

## 2014-03-07 LAB — HEMOGLOBIN A1C
HEMOGLOBIN A1C: 7.5 % — AB (ref <5.7)
Mean Plasma Glucose: 169 mg/dL — ABNORMAL HIGH (ref <117)

## 2014-03-07 MED ORDER — CLOPIDOGREL BISULFATE 75 MG PO TABS: 75.0000 mg | ORAL_TABLET | Freq: Every day | ORAL | Status: DC

## 2014-03-07 MED ORDER — ONDANSETRON HCL 4 MG PO TABS
4.0000 mg | ORAL_TABLET | Freq: Three times a day (TID) | ORAL | Status: DC | PRN
  Administered 2014-03-07: 4 mg via ORAL
  Filled 2014-03-07: qty 1

## 2014-03-07 MED ORDER — CLOPIDOGREL BISULFATE 75 MG PO TABS
75.0000 mg | ORAL_TABLET | Freq: Every day | ORAL | Status: DC
  Administered 2014-03-07: 75 mg via ORAL
  Filled 2014-03-07: qty 1

## 2014-03-07 MED ORDER — ONDANSETRON HCL 4 MG/2ML IJ SOLN: 4.0000 mg | Freq: Four times a day (QID) | INTRAMUSCULAR | Status: DC | PRN

## 2014-03-07 MED ORDER — ONDANSETRON HCL 4 MG PO TABS: 4.0000 mg | ORAL_TABLET | Freq: Three times a day (TID) | ORAL | Status: DC | PRN

## 2014-03-07 NOTE — Evaluation (Signed)
Physical Therapy Evaluation Patient Details Name: [name] MRN: [id number] DOB: 01/26/1941 Today's Date: 03/07/2014   History of Present Illness  [name] is a 73 y.o. female with a past medical history that includes CAD, diabetes, hypercholesterolemia anemia, hypertension, carotid stenosis presents to the emergency department with the chief complaint of aphasia. Initial workup in the emergency department included an MRI of the brain which revealed acute infarct in the right frontal operculum and right insular cortex with decreased flow or occlusion of right middle cerebral artery branch supplying this area. Negative for hemorrhagic transformation.  CT of the head reveals findings are consistent with early ischemic change in the posterior aspect of the right frontal lobe. Patient reports she was in her usual state of health until last evening when she had sudden onset of nausea. She states she and related to the bathroom to get some medicine and felt unsteady on her feet. She also noticed that she was unable to hold the pill bottle and unable to pick up spilled pills. She states she ambulated back to bed where she "dozed most of the night". This morning she awakened and felt no better so she called her son and she was brought to the emergency department. Associated symptoms include some numbness and tingling to the left side of her face along with a facial droop on the left and some slurred speech. She denies headache visual disturbances numbness or tingling of extremities. She denies any chest pain palpitations diaphoresis vomiting shortness of breath. She states that she keeps "a urinary tract infection" and suffers from stress incontinence.   Clinical Impression  Pt is a 73 year old female who presents to physical therapy with an acute infarct in the right frontal operculum and right insular cortex with decreased flow or occlusion of right middle cerebral artery branch.  She reports some  difficulty with walking, fine motor skills on Sunday night, and by the next morning her son noted facial droop on the Lt.   At this time, she reports continued facial droop, though improved fine motor skills and functional mobility.  Pt lives alone, with her son living across the street; pt reports she son is over everyday to check in on her.  To enter the home, she has to ascend/descend a flight of stairs with handrail on the Lt to the back door.  Prior to this hospitalization, the pt was (I) with ADLs and functional mobility skills without use of AD.  During evaluation, the pt was up in the chair and reports she has been able to amb to the bathroom and transfer to the chair while in the hospital with the help of staff.  She was able to perform bed mobility skills mod (I), transfers with supervision/min guard, and amb ~60 feet min guard.  Attempted amb without AD, though noted furniture walking in room/down hallway secondary to pt reports of unsteadiness so gait was changed to amb with AD.   Pt demonstrates improved gait patterning and balance with use of RW; noted difficulty with gait patterning and sequencing with use of std cane and pt continuing to hold onto furniture in room.  Pt only able to amb ~60 feet prior to seated rest break secondary to feeling of fatigue due to generalized weakness.  Recommend continued PT while in the hospital to address strengthening, DME use, activity tolerance, balance, for improved functional mobility skills, with transition to HHPT at discharge.  Recommend use of RW for gait to normalize gait pattern  and increase safety.       Follow Up Recommendations Home health PT    Equipment Recommendations  Rolling walker with 5" wheels    Recommendations for Other Services Speech consult;OT consult     Precautions / Restrictions Precautions Precautions: Fall;Other (comment) Precaution Comments: Stroke Precautions Restrictions Weight Bearing Restrictions: No       Mobility  Bed Mobility Overal bed mobility: Modified Independent             General bed mobility comments: Slow as pt reports generalized weakness with any activity  Transfers Overall transfer level: Needs assistance Equipment used: Rolling walker (2 wheeled) Transfers: Sit to/from Stand Sit to Stand: Min guard;Supervision         General transfer comment: Supervision from the chair, min guard from the bed.   Ambulation/Gait Ambulation/Gait assistance: Min guard Ambulation Distance (Feet): 60 Feet Assistive device: Rolling walker (2 wheeled);Straight cane Gait Pattern/deviations: Step-through pattern;Decreased step length - right;Decreased step length - left   Gait velocity interpretation: at or above normal speed for age/gender General Gait Details: Pt reports gait is slower than her normal speed.  VC for upright posturing.  Attempted gait without AD, with std cane, and with RW.        Balance Overall balance assessment: Needs assistance Sitting-balance support: Feet supported;No upper extremity supported Sitting balance-Leahy Scale: Good     Standing balance support: Single extremity supported;During functional activity (Unsteady during gait requiring 1-2 hands on AD) Standing balance-Leahy Scale: Fair                               Pertinent Vitals/Pain No pain reported.     Home Living Family/patient expects to be discharged to:: Private residence Living Arrangements: Alone Available Help at Discharge: Family;Available PRN/intermittently (Son lives across the street) Type of Home: House Home Access: Stairs to enter Entrance Stairs-Rails: Left Entrance Stairs-Number of Steps: Back Door - flight of stairs (pt uses back entrance bc it is near where the car is parked)   [employer] - 3 steps, landing, 2 steps   Home Equipment: None Additional Comments: Tub shower    Prior Function Level of Independence: Independent         Comments: Pt  is (I) with ADLs, IADLs, functional mobility skills, and driving.  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      Hand Dominance   Dominant Hand: Right    Extremity/Trunk Assessment   Upper Extremity Assessment: Defer to OT evaluation           Lower Extremity Assessment: Generalized weakness;RLE deficits/detail;LLE deficits/detail RLE Deficits / Details: MMT hip 4/5, knee/ankle 4+/5 LLE Deficits / Details: MMT hip 4-/5, knee/ankle 4+/5.  Noted mild deficits in Lt ankle coordination with fluid movements through dorsiflexion/plantarflexion without inversion compensation.      Communication   Communication: No difficulties  Cognition Arousal/Alertness: Awake/alert Behavior During Therapy: WFL for tasks assessed/performed Overall Cognitive Status: No family/caregiver present to determine baseline cognitive functioning                       Assessment/Plan    PT Assessment Patient needs continued PT services  PT Diagnosis Difficulty walking;Generalized weakness   PT Problem List Decreased strength;Decreased activity tolerance;Decreased balance;Decreased coordination;Decreased knowledge of use of DME;Decreased safety awareness;Decreased mobility  PT Treatment Interventions DME instruction;Therapeutic exercise;Gait training;Balance training;Stair training;Neuromuscular re-education;Functional mobility training;Therapeutic activities  PT Goals (Current goals can be found in the Care Plan section) Acute Rehab PT Goals Patient Stated Goal: Address facial droop, steady gait pattern PT Goal Formulation: With patient Time For Goal Achievement: 03/21/14 Potential to Achieve Goals: Good    Frequency Min 6X/week    End of Session Equipment Utilized During Treatment: Gait belt Activity Tolerance: Patient limited by fatigue Patient left: in bed;with call bell/phone within reach;with bed alarm set           Time: 0355-9741 PT Time Calculation (min):  19 min   Charges:   PT Evaluation $Initial PT Evaluation Tier I: 1 Procedure      WOODWORTH,STEPHANIE 03/07/2014, 8:47 AM

## 2014-03-07 NOTE — Care Management Utilization Note (Signed)
UR completed 

## 2014-03-07 NOTE — Evaluation (Signed)
Occupational Therapy Evaluation Patient Details Name: [name] MRN: [id number] DOB: [birth year] Today's Date: 03/07/2014    History of Present Illness [name] is a 73 y.o. female with a past medical history that includes CAD, diabetes, hypercholesterolemia anemia, hypertension, carotid stenosis presents to the emergency department with the chief complaint of aphasia. Initial workup in the emergency department included an MRI of the brain which revealed acute infarct in the right frontal operculum and right insular cortex with decreased flow or occlusion of right middle cerebral artery branch supplying this area. Negative for hemorrhagic transformation.  CT of the head reveals findings are consistent with early ischemic change in the posterior aspect of the right frontal lobe. Patient reports she was in her usual state of health until last evening when she had sudden onset of nausea. She states she and related to the bathroom to get some medicine and felt unsteady on her feet. She also noticed that she was unable to hold the pill bottle and unable to pick up spilled pills. She states she ambulated back to bed where she "dozed most of the night". This morning she awakened and felt no better so she called her son and she was brought to the emergency department. Associated symptoms include some numbness and tingling to the left side of her face along with a facial droop on the left and some slurred speech. She denies headache visual disturbances numbness or tingling of extremities. She denies any chest pain palpitations diaphoresis vomiting shortness of breath. She states that she keeps "a urinary tract infection" and suffers from stress incontinence.    Clinical Impression   Pt is presenting to acute OT with above situation.  She was very nauseated during OT eval, limiting scope of evaluation.  However, pt is presenting with minimal weakness in affected LUE and in seated position is grossly at  supervision level with ADL needs.  Pt will benefit form continued OT services to ensure safety in the home and improved strength and independence in ADL and IADL tasks to return to prior level of independent functioning.  Recommend HHOT services.    Follow Up Recommendations  Home health OT    Equipment Recommendations  Tub/shower seat    Recommendations for Other Services       Precautions / Restrictions Precautions Precautions: Fall Precaution Comments: Stroke Precautions Restrictions Weight Bearing Restrictions: No      Mobility Bed Mobility Overal bed mobility: Modified Independent              Transfers            Balance Overall balance assessment: Needs assistance Sitting-balance support: Feet supported;Single extremity supported Sitting balance-Leahy Scale: Good                                  ADL Overall ADL's : Needs assistance/impaired     Grooming: Modified independent;Sitting               Lower Body Dressing: Supervision/safety                       Vision     Ocular Range of Motion: Within Functional Limits Tracking/Visual Pursuits: Able to track stimulus in all quads without difficulty   Convergence: Impaired - to be further tested in functional context (Decreased movement in right eye)         Perception  Praxis      Pertinent Vitals/Pain Pt denies pain.  Pt is incredibly nauseous - pt has informed Therapist, sports.     Hand Dominance Right   Extremity/Trunk Assessment Upper Extremity Assessment Upper Extremity Assessment: LUE deficits/detail;Overall [facility] for tasks assessed;Generalized weakness LUE Deficits / Details: WFL ROM.  shoulder grossly 4/5, elbow 5/5, wrist 4+/5, weakend grip strength. LUE Sensation: decreased light touch (Decreased sharp/dull, decreased stereognosis.) LUE Coordination: decreased fine motor   Lower Extremity Assessment Lower Extremity Assessment: Defer to PT evaluation RLE  Deficits / Details: MMT hip 4/5, knee/ankle 4+/5 LLE Deficits / Details: MMT hip 4-/5, knee/ankle 4+/5.  Noted mild deficits in Lt ankle coordination with fluid movements through dorsiflexion/plantarflexion without inversion compensation.        Communication Communication Communication: No difficulties   Cognition Arousal/Alertness: Awake/alert Behavior During Therapy: WFL for tasks assessed/performed Overall Cognitive Status: Within Functional Limits for tasks assessed                     General Comments       Exercises       Shoulder Instructions      Home Living Family/patient expects to be discharged to:: Private residence Living Arrangements: Alone Available Help at Discharge: Family;Available PRN/intermittently (son lives across the street) Type of Home: House Home Access: Stairs to enter Technical brewer of Steps: Back Door - flight of stairs (pt uses back entrance bc it is near where the car is parked)   [employer] - 3 steps, landing, 2 steps Entrance Stairs-Rails: Left Home Layout: One level;Laundry or work area in basement     [employer] Shower/Tub: Teacher, early years/pre: [employer]: None   Additional Comments: Tub shower      Prior Functioning/Environment Level of Independence: Independent        Comments: Pt was independent with ADLs and IADLs.  son would occassionally asist with running errands, and would mow the lawn.    OT Diagnosis: Generalized weakness;Hemiplegia non-dominant side   OT Problem List: Decreased strength;Decreased coordination;Decreased activity tolerance;Impaired sensation   OT Treatment/Interventions: Self-care/ADL training;Neuromuscular education;Energy conservation;Patient/family education;Therapeutic activities    OT Goals(Current goals can be found in the care plan section) Acute Rehab OT Goals Patient Stated Goal: Address facial droop, steady gait pattern OT Goal Formulation:  With patient Time For Goal Achievement: 03/21/14 Potential to Achieve Goals: Good ADL Goals Pt Will Transfer to Toilet: with modified independence Pt Will Perform Toileting - Clothing Manipulation and hygiene: with modified independence Pt Will Perform Tub/Shower Transfer: with modified independence Pt/caregiver will Perform Home Exercise Program: Increased strength;Left upper extremity  OT Frequency: Min 2X/week   Barriers to D/C:            Co-evaluation              End of Session    Activity Tolerance: Other (comment) (Pt limited by nausea.) Patient left: in bed;with call bell/phone within reach;with bed alarm set   Time: 2409-7353 OT Time Calculation (min): 18 min Charges:  OT General Charges $OT Visit: 1 Procedure OT Evaluation $Initial OT Evaluation Tier I: 1 Procedure G-Codes:     Bea Graff, MS, OTR/L [phone]  03/07/2014, 9:43 AM

## 2014-03-07 NOTE — Evaluation (Signed)
Speech Language Pathology Evaluation Patient Details Name: [name] MRN: [id number] DOB: 10-18-1940 Today's Date: 03/07/2014 Time: 9532-0233 SLP Time Calculation (min): 40 min  Problem List:  Patient Active Problem List   Diagnosis Date Noted  . CVA (cerebral vascular accident) 03/06/2014  . Acute renal failure 03/06/2014  . CVA (cerebral infarction) 03/06/2014  . UTI (lower urinary tract infection)   . PSVT (paroxysmal supraventricular tachycardia), recent episode this week 12/09/2013  . Esophageal varices 03/29/2013  . Hematemesis 03/29/2013  . Cirrhosis of liver without mention of alcohol 03/29/2013  . Presence of stent in right coronary artery 07/04/2011  . OSA (obstructive sleep apnea) 07/04/2011  . Aortic sclerosis 07/04/2011  . Aortic insufficiency 07/04/2011  . HTN (hypertension) 07/04/2011  . Carotid stenosis, bilateral 07/04/2011  . Chest pain at rest 07/03/2011  . CAD (coronary artery disease) 07/03/2011  . DM (diabetes mellitus) 07/03/2011  . Hypercholesteremia 07/03/2011  . GERD (gastroesophageal reflux disease) 07/03/2011   Past Medical History:  Past Medical History  Diagnosis Date  . Back pain   . GERD (gastroesophageal reflux disease)   . MI (myocardial infarction)   . Hypercholesteremia   . Diabetes mellitus   . Anemia   . Cirrhosis   . Aortic sclerosis 07/04/2011  . Aortic insufficiency 07/04/2011    most recent 2D Echo 02/24/2012  EF greater than 55%  . Carotid stenosis, bilateral 07/04/2011    most recent 05/2010 carotid doppler  . OSA (obstructive sleep apnea) 07/04/2011    sleep study 2009  AHI 9.91/hr and during REM sleep 35.58/hr  . Presence of stent in right coronary artery 07/04/2011  . HTN (hypertension) 07/04/2011  . Coronary artery disease   . Myocardial infarction 1995  . GERD (gastroesophageal reflux disease)   . Non-alcoholic fatty liver disease   . UTI (lower urinary tract infection)     chronic/recurring/on bactrim    Past Surgical History:  Past Surgical History  Procedure Laterality Date  . Back surgery  1985  . Tonsillectomy    . Appendectomy    . Dilation and curettage of uterus    . Colonoscopy    . Eye surgery  08    cataract surgery of the left eye  . Ercp    . Abdominal hysterectomy  1972  . Cardiac catheterization  1995.,2006,1997    stent to the proximal RCA after MI   . Myringotomy  2012    both ears  . Hardware removal Right 01/17/2013    Procedure: REMOVAL OF HARDWARE AND EXCISION ULNAR STYLOID RIGHT WRIST;  Surgeon: Tennis Must, MD;  Location: [place];  Service: Orthopedics;  Laterality: Right;  . Wrist surgery      rt wrist hardwear removal  . Esophagogastroduodenoscopy N/A 04/01/2013    Procedure: ESOPHAGOGASTRODUODENOSCOPY (EGD);  Surgeon: Rogene Houston, MD;  [facility];  Service: Endoscopy;  Laterality: N/A;  230-rescheduled to 8:30am Ann notified pt  . Esophageal banding N/A 04/01/2013    Procedure: ESOPHAGEAL BANDING;  Surgeon: Rogene Houston, MD;  [facility];  Service: Endoscopy;  Laterality: N/A;  . Esophagogastroduodenoscopy N/A 05/24/2013    Procedure: ESOPHAGOGASTRODUODENOSCOPY (EGD);  Surgeon: Rogene Houston, MD;  [facility];  Service: Endoscopy;  Laterality: N/A;  730  . Esophageal banding N/A 05/24/2013    Procedure: ESOPHAGEAL BANDING;  Surgeon: Rogene Houston, MD;  [facility];  Service: Endoscopy;  Laterality: N/A;   HPI:  HPI: [name]  is a 73 y.o. female with a past medical history that includes CAD, diabetes, hypercholesterolemia anemia, hypertension, carotid stenosis presents to the emergency department with the chief complaint of aphasia. Initial workup in the emergency department included an MRI of the brain which revealed acute infarct in the right frontal operculum and right insular cortex with decreased flow or occlusion of right middle cerebral artery branch supplying this area.  Negative for hemorrhagic transformation. Pt stated she is most concerned about her speech production/clarity; passed initial swallow screen, but said "I get strangled sometimes, but if I slow down, it helps a lot."   Assessment / Plan / Recommendation Clinical Impression   Pt exhibits mild flaccid dysarthria during repetition of sentences-conversational tasks characterized by low vocal intensity and imprecise speech sound production at sentence-conversation level.  Pt is aware of errors in speech.  Auditory comprehension appears wfl at this time as pt was able to follow simple-complex functional tasks and answer complex questions.   Cognitive tasks also appeared wfl, but a full cognitive evaluation may be warranted once she is d/c with home health SLP as organizational tasks were questionable during this evaluation.  Pt exhibited decreased left facial/lingual and labial strength/ROM with oral motor tasks during OME.  Pt is very motivated to improve and will be an excellent candidate for rehabilitation with positive family support and previous level of functioning a strong indicator for success.  Pt c/o "strangling" intermittently during meals, but SLP observed straw sips of thin liquids and no s/s of aspiration were noted and patient passed the swallow screen on 03/06/14.    SLP Assessment  Patient needs continued Speech Lanaguage Pathology Services    Follow Up Recommendations  Home health SLP    Frequency and Duration min 2x/week  2 weeks   Pertinent Vitals/Pain WDL   SLP Goals  SLP Goals Potential to Achieve Goals: Good Progress/Goals/Alternative treatment plan discussed with pt/caregiver and they: Agree  SLP Evaluation Prior Functioning  Cognitive/Linguistic Baseline: Within functional limits Type of Home: House  Lives With: Spouse Available Help at Discharge: Family;Available PRN/intermittently Education: 2 years business college post HS Vocation: Retired   Associate Professor  Overall  Cognitive Status: Within Advertising copywriter for tasks assessed Arousal/Alertness: Awake/alert Orientation Level: Oriented X4;Oriented to person;Oriented to place;Oriented to time;Oriented to situation Memory: Appears intact Awareness: Appears intact Problem Solving: Appears intact Executive Function: Writer: Impaired Organizing Impairment: Verbal complex Behaviors: Perseveration Safety/Judgment: Appears intact    Comprehension  Auditory Comprehension Overall Auditory Comprehension: Appears within functional limits for tasks assessed Yes/No Questions: Within Functional Limits Commands: Within Functional Limits Conversation: Complex Interfering Components: Visual impairments;Processing speed (Pt did not have glasses to complete certain tasks) EffectiveTechniques: Extra processing time (minimally needed) Visual Recognition/Discrimination Discrimination: Not tested Reading Comprehension Reading Status: Unable to assess (comment) (Pt did not have correct glasses)    Expression Expression Primary Mode of Expression: Verbal Verbal Expression Overall Verbal Expression: Impaired Initiation: No impairment Level of Generative/Spontaneous Verbalization: Conversation Repetition: Impaired Level of Impairment: Sentence level Naming: No impairment Confrontation: Within functional limits Verbal Errors: Perseveration;Aware of errors Pragmatics: No impairment Interfering Components: Speech intelligibility Non-Verbal Means of Communication: Not applicable Written Expression Dominant Hand: Right Written Expression: Not tested   Oral / Motor Oral Motor/Sensory Function Overall Oral Motor/Sensory Function: Impaired Labial ROM: Reduced left Labial Symmetry: Abnormal symmetry left Labial Strength: Reduced Labial Sensation: Reduced Lingual ROM: Reduced left Lingual Symmetry: Abnormal symmetry left Lingual Strength: Reduced Lingual Sensation: Reduced Facial ROM: Reduced  left Facial Symmetry: Left  droop Facial Strength: Reduced Facial Sensation: Reduced Velum: Within Functional Limits Mandible: Within Functional Limits Motor Speech Overall Motor Speech: Appears within functional limits for tasks assessed Respiration: Impaired Level of Impairment: Conversation Phonation: Low vocal intensity Resonance: Within functional limits Articulation: Impaired Level of Impairment: Conversation Intelligibility: Intelligibility reduced Word: 75-100% accurate Phrase: 75-100% accurate Sentence: 50-74% accurate Conversation: 50-74% accurate Motor Planning: Witnin functional limits Motor Speech Errors: Not applicable Effective Techniques: Slow rate;Increased vocal intensity;Over-articulate;Pause        Stanly Si,PAT, M.S., CCC-SLP 03/07/2014, 1:04 PM

## 2014-03-07 NOTE — Care Management Note (Signed)
    Page 1 of 1   03/07/2014     3:17:59 PM CARE MANAGEMENT NOTE 03/07/2014  Patient:  [name], [name]   Account Number:  [id number]  Date Initiated:  03/07/2014  Documentation initiated by:  Vladimir Creeks  Subjective/Objective Assessment:   Admitted with CVA. Pt is from home, lives alone, but son available to assist if needed.  She is independent in the home. She will need HH to follow for therapies     Action/Plan:   pt comfortable with HH to follow   Anticipated DC Date:  03/08/2014   Anticipated DC Plan:  [place]  CM consult      Choice offered to / List presented to:  [name]   DME arranged  Vassie Moselle      DME agency  [place] arranged  [facility]      [place].   Status of service:  Completed, signed off Medicare Important Message given?  NA - LOS <3 / Initial given by admissions (If response is "NO", the following Medicare IM given date fields will be blank) Date Medicare IM given:   Medicare IM given by:   Date Additional Medicare IM given:   Additional Medicare IM given by:    Discharge Disposition:  [place]  Per UR Regulation:    If discussed at Long Length of Stay Meetings, dates discussed:    Comments:  03/07/14 [place] RN/CM

## 2014-03-07 NOTE — Progress Notes (Signed)
*  PRELIMINARY RESULTS* Echocardiogram 2D Echocardiogram has been performed.  [name] 03/07/2014, 12:33 PM

## 2014-03-07 NOTE — Discharge Summary (Signed)
Physician Discharge Summary  [name] YFV:[id number] DOB: 07-14-1941 DOA: 03/06/2014  PCP: Barbara Grills, MD  Admit date: 03/06/2014 Discharge date: 03/07/2014  Time spent: 40 minutes  Recommendations for Outpatient Follow-up:  1. Dr. Orson [name] in 1 week for evaluation of facial weakness, speech and gait.  2. May consider adjunct therapy for lipids given currently at max dose of crestor.  3. Dual anti-platelet therapy for 3 months then plavix alone.  4. Out-patient limited bubble study 03/09/14 at [facility]. Follow results.  5. Appointment with Dr. Merlene [name] 05/01/14   Discharge Diagnoses:  Principal Problem:   CVA (cerebral vascular accident) Active Problems:   CAD (coronary artery disease)   DM (diabetes mellitus)   Hypercholesteremia   GERD (gastroesophageal reflux disease)   OSA (obstructive sleep apnea)   HTN (hypertension)   Carotid stenosis, bilateral   Cirrhosis of liver without mention of alcohol   Acute renal failure   UTI (lower urinary tract infection)   CVA (cerebral infarction)   Discharge Condition: stable  Diet recommendation: heart healthy   Filed Weights   03/06/14 0757  Weight: 92.987 kg (205 lb)    History of present illness:  [name] is a 73 y.o. female with a past medical history that includes CAD, diabetes, hypercholesterolemia anemia, hypertension, carotid stenosis presented to the emergency department on 03/06/14 with the chief complaint of aphasia. Initial workup in the emergency department included an MRI of the brain which revealed acute infarct in the right frontal operculum and right insular cortex with decreased flow or occlusion of right middle cerebral artery branch supplying this area. Negative for hemorrhagic transformation.   Patient reported she was in her usual state of health until evening prior when she had sudden onset of nausea. She stated she ambulated to the bathroom to get some medicine and felt unsteady on  her feet. She also noticed that she was unable to hold the pill bottle and unable to pick up spilled pills. She stated she ambulated back to bed where she "dozed most of the night". The following morning she awakened and felt no better so she called her son and she was brought to the emergency department. Associated symptoms included some numbness and tingling to the left side of her face along with a facial droop on the left and some slurred speech. She denied headache visual disturbances numbness or tingling of extremities. She denied any chest pain palpitations diaphoresis vomiting shortness of breath. She stated that she keeps "a urinary tract infection" and suffers from stress incontinence.   Emergency department she was given 325 mg aspirin. MRI of the brain as above. CT of the head revealed findings are consistent with early ischemic change in the posterior aspect of the right frontal lobe. EKG with prolonged PR interval. No acute changes. Complete metabolic panel revealed creatinine of 1.21 serum glucose of 177. CBC with a hemoglobin 11.4 and platelets 149. Urinalysis with few bacteria diffuse, cells small leukocytes WBCs 3-6.  She was hemodynamically stable afebrile and not hypoxic.   Hospital Course:  CVA (cerebral vascular accident): Per MRI right frontal infarct. Patient with history of CAD, diabetes, carotid stenosis, obstructive sleep apnea, smoking. Onset of symptoms 12 hours before presentation. MRA the head/brain yield no major vessel occlusion or correctable proximal stenosis.This includes demonstrable flow within the region of the right frontal cortical and subcortical infarction. Carotid doppler yields Bilateral atherosclerotic plaque at the level of the distal carotid bulbs and proximal internal carotid arteries. Estimated right  ICA stenosis is less than 50%. Estimated left ICA stenosis is 50- 69%. 2 d-echo yields mild LVH with LVEF 24-40%, grade 1 diastolic dysfunction. Severe posterior  MAC with trivial mitral regurgitation and borderline mild mitral stenosis. Mild to moderate left atrial enlargement. Moderately sclerotic aortic valve with mild aortic regurgitation, no aortic stenosis. Unable to assess PASP. Cannot exclude PFO. Outpatient bubble study scheduled 03/09/14. Lipid panel with cholesterol 215, LDL 147 otherwise within limits of normal. Hemoglobin A1c 7.5. Physical therapy and occupational therapy evaluations recommend [place] for both. Evaluated by speech who recommend [facility]. Evaluated by Dr [name] who opined right MCA likely due to cranial occlusive disease. Recommends dual antiplatelet therapy for 3 months then transition to plavix alone. Also recommends increase in statin dose. She is already at max of crestor. May consider adjunct therapy for elevated lipids.   Active Problems:  Acute renal failure; likely related to decreased by mouth intake. Resolved at discharge.  UTI (lower urinary tract infection): Patient reported history of chronic/recurrent urinary tract infection. Home medications include Bactrim. Urine culture pending at discharge. Continue suppressive home therapy.   CAD (coronary artery disease): Denied chest pain. History of RCA stent in 1995. Chart review indicated last cath was 2007 and chart indicates minimal CAD at that time. She had a lexiscan Myoview which was normal approximately 2 months ago.   DM (diabetes mellitus): A1c 7.5. Continue home regimen    Hypercholesteremia: See #1. Already at max dose of crestor.  Consider adjunct therapy.   GERD (gastroesophageal reflux disease): Stable at baseline.   OSA (obstructive sleep apnea):  HTN (hypertension): Controlled   Carotid stenosis, bilateral: Chart review indicates patient had carotid ultrasound in November of 2014 which revealed slightly worse blood flow on the left and in the left subclavian artery as well. Her Crestor dose was increased at this time. Carotid doppler as above.   Cirrhosis of  liver without mention of alcohol: Chart review indicates history of cirrhosis secondary to NAFLD diagnosed by liver biopsy in April 2008 revealing stage IV fibrosis and steatosis. In addition status post EGD 7/14 when 3 varices were banded and she had portal gastropathy.   Procedures: 2 d-echo Mild LVH with LVEF 10-27%, grade 1 diastolic dysfunction. Severe posterior MAC with trivial mitral regurgitation and borderline mild mitral stenosis. Mild to moderate left atrial enlargement. Moderately sclerotic aortic valve with mild aortic regurgitation, no aortic stenosis. Unable to assess PASP. Cannot exclude PFO.  Consultations:  Dr [name] neurology  Discharge Exam: Filed Vitals:   03/07/14 0602  BP: 132/44  Pulse: 88  Temp: 99.1 F (37.3 C)  Resp: 18    General: well nourished appears calm and comfortable Cardiovascular: RRR no m/g/r no LE edema Respiratory: normal effort BS clear bilaterally no wheeze Neuro: alert oriented x3. Left facial weakness, tongue protrudes to left. Sensation in tact.   Discharge Instructions You were cared for by a hospitalist during your hospital stay. If you have any questions about your discharge medications or the care you received while you were in the hospital after you are discharged, you can call the unit and asked to speak with the hospitalist on call if the hospitalist that took care of you is not available. Once you are discharged, your primary care physician will handle any further medical issues. Please note that NO REFILLS for any discharge medications will be authorized once you are discharged, as it is imperative that you return to your primary care physician (or establish a relationship with  a primary care physician if you do not have one) for your aftercare needs so that they can reassess your need for medications and monitor your lab values.  Discharge Instructions   Diet - low sodium heart healthy    Complete by:  As directed       Discharge instructions    Complete by:  As directed   Take medication as directed Will take plavix and aspirin for 3 months then stop aspirin Follow up with PCP 1-2 weeks for evaluation of facial weekness     Increase activity slowly    Complete by:  As directed             Medication List         aspirin 81 MG tablet  Take 81 mg by mouth daily.     carvedilol 12.5 MG tablet  Commonly known as:  COREG  Take 1 tablet (12.5 mg total) by mouth 2 (two) times daily.     clopidogrel 75 MG tablet  Commonly known as:  PLAVIX  Take 1 tablet (75 mg total) by mouth daily.     diphenhydrAMINE 50 MG tablet  Commonly known as:  BENADRYL  Take 50 mg by mouth at bedtime as needed for itching or sleep.     insulin NPH-regular Human (70-30) 100 UNIT/ML injection  Commonly known as:  NOVOLIN 70/30  Inject 72 Units into the skin 2 (two) times daily with a meal.     isosorbide mononitrate 30 MG 24 hr tablet  Commonly known as:  IMDUR  Take 1 tablet (30 mg total) by mouth daily.     LANTUS SOLOSTAR 100 UNIT/ML injection  Generic drug:  insulin glargine  Inject 72 Units into the skin at bedtime.     omeprazole 20 MG tablet  Commonly known as:  PRILOSEC OTC  Take 20 mg by mouth daily.     ondansetron 4 MG tablet  Commonly known as:  ZOFRAN  Take 1 tablet (4 mg total) by mouth every 8 (eight) hours as needed for nausea or vomiting.     rosuvastatin 40 MG tablet  Commonly known as:  CRESTOR  Take 1 tablet (40 mg total) by mouth at bedtime.     sulfamethoxazole-trimethoprim 800-160 MG per tablet  Commonly known as:  BACTRIM DS  Take 1 tablet by mouth daily.     valsartan-hydrochlorothiazide 160-12.5 MG per tablet  Commonly known as:  DIOVAN-HCT  Take 1 tablet by mouth daily.     zolpidem 5 MG tablet  Commonly known as:  AMBIEN  Take 5 mg by mouth at bedtime as needed for sleep.       Allergies  Allergen Reactions  . Tape Rash    ADHESIVE TAPE      The results of  significant diagnostics from this hospitalization (including imaging, microbiology, ancillary and laboratory) are listed below for reference.    Significant Diagnostic Studies: Dg Chest 2 View  03/06/2014   CLINICAL DATA:  Cerebrovascular accident.  EXAM: CHEST  2 VIEW  COMPARISON:  March 25, 2013.  FINDINGS: The heart size and mediastinal contours are within normal limits. Both lungs are clear. No pneumothorax or pleural effusion is noted. The visualized skeletal structures are unremarkable.  IMPRESSION: No acute cardiopulmonary abnormality seen.   Electronically Signed   By: Sabino Dick M.D.   On: 03/06/2014 16:19   Ct Head Wo Contrast  03/06/2014   ADDENDUM REPORT: 03/06/2014 11:53  ADDENDUM: The small amount of increased  density in the sylvian fissure is consistent with intraluminal thrombus in light of the subsequent MRI findings. It is not now felt to reflect acute blood.   Electronically Signed   By: David  Martinique   On: 03/06/2014 11:53   03/06/2014   CLINICAL DATA:  Status post multiple falls; left-sided facial droop  EXAM: CT HEAD WITHOUT CONTRAST  TECHNIQUE: Contiguous axial images were obtained from the base of the skull through the vertex without intravenous contrast.  COMPARISON:  Noncontrast CT scan of the brain of February 24, 2012.  FINDINGS: There is new subtle decreased density within the right frontal lobe posteriorly. There is a tiny amount of increased density within the sylvian fissure on the right which is new and may reflect small amounts of blood. There is stable bifrontal cerebral atrophy. There is no shift of the midline. Old lacunar infarctions in the basal ganglia are present bilaterally. The cerebellum and brainstem are normal in density.  At bone window settings the observed portions of the paranasal sinuses and mastoid air cells are clear. There is no acute skull fracture.  IMPRESSION: 1. The findings are consistent with early ischemic change in the posterior aspect of the right  frontal lobe. A tiny amount of acute blood within or adjacent to the sylvian fissure on the right is present. 2. There is no CT evidence of significantly increased intracranial pressure. 3. No acute skull fracture is demonstrated. 4. Critical Value/emergent results were called by telephone at the time of interpretation on 03/06/2014 at 10:11 AM to Dr. Fredia Sorrow , who verbally acknowledged these results.  Electronically Signed: By: David  [place] On: 03/06/2014 10:12   Mr Jodene Nam Head Wo Contrast  03/06/2014   CLINICAL DATA:  Acute right frontal infarction  EXAM: MRA HEAD WITHOUT CONTRAST  TECHNIQUE: Angiographic images of the Circle of Willis were obtained using MRA technique without intravenous contrast.  COMPARISON:  MRI same day  FINDINGS: Both internal carotid arteries are widely patent into the brain. The anterior and middle cerebral vessels are patent bilaterally. This includes flow within the region of the acute infarction. Intracranial branch vessels do show some narrowing and irregularity.  Both vertebral arteries are widely patent to the basilar. No basilar stenosis. Posterior circulation branch vessels are patent, also showing some distal vessel atherosclerotic irregularity.  IMPRESSION: No major vessel occlusion or correctable proximal stenosis. This includes demonstrable flow within the region of the right frontal cortical and subcortical infarction.  Distal intracranial branch vessels do show atherosclerotic irregularity.   Electronically Signed   By: Nelson Chimes M.D.   On: 03/06/2014 20:51   Mr Brain Wo Contrast  03/06/2014   CLINICAL DATA:  Left facial droop.  Aphasia  EXAM: MRI HEAD WITHOUT CONTRAST  TECHNIQUE: Multiplanar, multiecho pulse sequences of the brain and surrounding structures were obtained without intravenous contrast.  COMPARISON:  CT head 03/06/2014  FINDINGS: Acute infarct in the right frontal operculum with some extension into the posterior insula. No other areas of acute  infarct are identified.  Chronic microvascular ischemic changes in the white matter. Chronic infarcts in the left thalamus and pons bilaterally.  Negative for hemorrhage. Hyperdensity in the right sylvian fissure on the CT felt to be clot in the right MCA. There is slow flow or occlusion in the right parietal branch of the MCA on FLAIR imaging.  Ventricle size is normal. No shift of the midline structures. Negative for mass lesion.  Mastoid sinus effusion bilaterally.  IMPRESSION: Acute infarct in the  right frontal operculum and right insular cortex with decreased flow or occlusion of right middle cerebral artery branch supplying this area. Negative for hemorrhagic transformation.   Electronically Signed   By: Franchot Gallo M.D.   On: 03/06/2014 11:54   US Carotid Bilateral  03/06/2014   CLINICAL DATA:  Acute right hemispheric cerebral infarction. History of coronary artery disease, myocardial infarction, hyperlipidemia, diabetes and smoking.  EXAM: BILATERAL CAROTID DUPLEX ULTRASOUND  TECHNIQUE: Pearline Cables scale imaging, color Doppler and duplex ultrasound were performed of bilateral carotid and vertebral arteries in the neck.  COMPARISON:  05/14/2010  FINDINGS: Criteria: Quantification of carotid stenosis is based on velocity parameters that correlate the residual internal carotid diameter with NASCET-based stenosis levels, using the diameter of the distal internal carotid lumen as the denominator for stenosis measurement.  The following velocity measurements were obtained:  RIGHT  ICA:  132/29 cm/sec  CCA:  062/69 cm/sec  SYSTOLIC ICA/CCA RATIO:  1.3  DIASTOLIC ICA/CCA RATIO:  2.3  ECA:  183 cm/sec  LEFT  ICA:  197/22 cm/sec  CCA:  485/46 cm/sec  SYSTOLIC ICA/CCA RATIO:  1.6  DIASTOLIC ICA/CCA RATIO:  1.6  ECA:  301 cm/sec  RIGHT CAROTID ARTERY: A mild amount of predominately calcified plaque is present at the level of the distal bulb and proximal ICA. Proximal velocities and waveforms are unremarkable and  estimated ICA stenosis is less than 50%. Mildly elevated velocities in the higher internal carotid segment within the upper neck noted at a level of mild tortuosity without focal plaque.  RIGHT VERTEBRAL ARTERY: Antegrade flow with normal waveform and velocity.  LEFT CAROTID ARTERY: Stable moderate plaque burden at the level of the distal bulb and extending up to the ICA origin. This causes luminal stenosis with turbulent flow. Estimated proximal left ICA stenosis is 50- 69%.  LEFT VERTEBRAL ARTERY: Antegrade flow with normal waveform and velocity.  IMPRESSION: Bilateral atherosclerotic plaque at the level of the distal carotid bulbs and proximal internal carotid arteries. Estimated right ICA stenosis is less than 50%. Estimated left ICA stenosis is 50- 69%.   Electronically Signed   By: Aletta Edouard M.D.   On: 03/06/2014 16:42    Microbiology: No results found for this or any previous visit (from the past 240 hour(s)).   Labs: Basic Metabolic Panel:  Recent Labs Lab 03/06/14 0907 03/07/14 0955  NA 139 141  K 4.0 4.0  CL 102 105  CO2 22 23  GLUCOSE 177* 155*  BUN 27* 17  CREATININE 1.21* 0.95  CALCIUM 9.3 8.7   Liver Function Tests:  Recent Labs Lab 03/06/14 0907  AST 30  ALT 25  ALKPHOS 108  BILITOT 0.5  PROT 6.8  ALBUMIN 3.4*   No results found for this basename: LIPASE, AMYLASE,  in the last 168 hours No results found for this basename: AMMONIA,  in the last 168 hours CBC:  Recent Labs Lab 03/06/14 0907  WBC 7.0  NEUTROABS 5.2  HGB 11.4*  HCT 33.9*  MCV 91.1  PLT 149*   Cardiac Enzymes:  Recent Labs Lab 03/06/14 0907  TROPONINI <0.30   BNP: BNP (last 3 results) No results found for this basename: PROBNP,  in the last 8760 hours CBG:  Recent Labs Lab 03/06/14 1658 03/06/14 2237 03/07/14 0750 03/07/14 1158  GLUCAP 130* 138* 116* 131*       Signed:  BLACK,KAREN M  [facility] 03/07/2014, 1:29 PM

## 2014-03-07 NOTE — Plan of Care (Signed)
Pt lost IV access and doctor informed.  Ok to leave out for now - [place] later today after Echo.

## 2014-03-07 NOTE — Discharge Summary (Signed)
Patient seen, independently examined and chart reviewed. I agree with exam, assessment and plan discussed with Dyanne Carrel, NP.  Subjective: Feels better today. Speech is better today. No difficulty with meals. No recurrent paresthesias or difficulty with her extremities. Mild nausea.  Objective: Afebrile, vital signs stable. No hypoxia.  Gen. Appears calm and comfortable.  Psych. Alert. Speech fluent and appropriate. Much more clearer today.  Cardiovascular. Regular rate and rhythm. No murmur, rub or gallop. Telemetry sinus rhythm.  Respiratory. Clear to auscultation bilaterally. No wheezes, rales or rhonchi. Normal respiratory effort.  Musculoskeletal. Moves all extremities well. Strength all extremities grossly normal.  Neurologic. Left facial weakness is somewhat improved today. No new focal neurologic deficits.   Chemistry: Basic metabolic panel was normal BUN and creatinine.  Imaging: MRI of the brain with no major vessel occlusion or correctable proximal stenosis. Right ICA stenosis less than 50%. Left ICA stenosis 50-69%. 2-D echocardiogram LVEF 70-75%. Grade 1 diastolic dysfunction. Cannot exclude PFO. Discussed with interpreting cardiologist Dr. Domenic Polite, PFO is doubted but cannot be excluded. He recommends outpatient limited echo with bubble study to fully exclude but does not think the patient requires further hospitalization.  Overall improving clinically. Neurology consultation appreciated, Plavix initiated, patient will continue on aspirin. Plan outpatient followup in 2 months with neurology. Plan outpatient limited echo for bubble study. She is already on maximum dose of Crestor. Her hemoglobin A1c was somewhat elevated but she is highly motivated to control her diabetes and this can be further followed in the outpatient setting. Ca stenosis medical management was recommended by neurology. Urine cultures pending but doubt significant, these results will be followed  up.  Murray Hodgkins, MD [facility] [phone]

## 2014-03-09 ENCOUNTER — Ambulatory Visit (HOSPITAL_COMMUNITY): Admit: 2014-03-09 | Payer: Medicare Other

## 2014-03-09 ENCOUNTER — Emergency Department (HOSPITAL_COMMUNITY): Payer: Medicare Other

## 2014-03-09 ENCOUNTER — Encounter (HOSPITAL_COMMUNITY): Payer: Self-pay | Admitting: Emergency Medicine

## 2014-03-09 ENCOUNTER — Emergency Department (HOSPITAL_COMMUNITY)
Admission: EM | Admit: 2014-03-09 | Discharge: 2014-03-09 | Disposition: A | Discharge status: Home / Self Care | Payer: Medicare Other | Attending: Emergency Medicine | Admitting: Emergency Medicine

## 2014-03-09 DIAGNOSIS — I252 Old myocardial infarction: Secondary | ICD-10-CM | POA: Insufficient documentation

## 2014-03-09 DIAGNOSIS — Z8669 Personal history of other diseases of the nervous system and sense organs: Secondary | ICD-10-CM | POA: Insufficient documentation

## 2014-03-09 DIAGNOSIS — Z87440 Personal history of urinary (tract) infections: Secondary | ICD-10-CM | POA: Insufficient documentation

## 2014-03-09 DIAGNOSIS — I2510 Atherosclerotic heart disease of native coronary artery without angina pectoris: Secondary | ICD-10-CM | POA: Insufficient documentation

## 2014-03-09 DIAGNOSIS — Z9889 Other specified postprocedural states: Secondary | ICD-10-CM | POA: Insufficient documentation

## 2014-03-09 DIAGNOSIS — Z7982 Long term (current) use of aspirin: Secondary | ICD-10-CM | POA: Insufficient documentation

## 2014-03-09 DIAGNOSIS — I10 Essential (primary) hypertension: Secondary | ICD-10-CM | POA: Insufficient documentation

## 2014-03-09 DIAGNOSIS — Z9861 Coronary angioplasty status: Secondary | ICD-10-CM | POA: Insufficient documentation

## 2014-03-09 DIAGNOSIS — I6789 Other cerebrovascular disease: Secondary | ICD-10-CM

## 2014-03-09 DIAGNOSIS — I6350 Cerebral infarction due to unspecified occlusion or stenosis of unspecified cerebral artery: Secondary | ICD-10-CM | POA: Insufficient documentation

## 2014-03-09 DIAGNOSIS — Z794 Long term (current) use of insulin: Secondary | ICD-10-CM | POA: Insufficient documentation

## 2014-03-09 DIAGNOSIS — K219 Gastro-esophageal reflux disease without esophagitis: Secondary | ICD-10-CM | POA: Insufficient documentation

## 2014-03-09 DIAGNOSIS — E119 Type 2 diabetes mellitus without complications: Secondary | ICD-10-CM | POA: Insufficient documentation

## 2014-03-09 DIAGNOSIS — Z792 Long term (current) use of antibiotics: Secondary | ICD-10-CM | POA: Insufficient documentation

## 2014-03-09 DIAGNOSIS — Z7902 Long term (current) use of antithrombotics/antiplatelets: Secondary | ICD-10-CM | POA: Insufficient documentation

## 2014-03-09 DIAGNOSIS — Z87891 Personal history of nicotine dependence: Secondary | ICD-10-CM | POA: Insufficient documentation

## 2014-03-09 DIAGNOSIS — Z79899 Other long term (current) drug therapy: Secondary | ICD-10-CM | POA: Insufficient documentation

## 2014-03-09 DIAGNOSIS — E7800 Pure hypercholesterolemia, unspecified: Secondary | ICD-10-CM | POA: Insufficient documentation

## 2014-03-09 DIAGNOSIS — Z862 Personal history of diseases of the blood and blood-forming organs and certain disorders involving the immune mechanism: Secondary | ICD-10-CM | POA: Insufficient documentation

## 2014-03-09 DIAGNOSIS — I639 Cerebral infarction, unspecified: Secondary | ICD-10-CM

## 2014-03-09 LAB — COMPREHENSIVE METABOLIC PANEL
ALBUMIN: 3.3 g/dL — AB (ref 3.5–5.2)
ALT: 23 U/L (ref 0–35)
AST: 41 U/L — ABNORMAL HIGH (ref 0–37)
Alkaline Phosphatase: 95 U/L (ref 39–117)
Anion gap: 12 (ref 5–15)
BILIRUBIN TOTAL: 0.5 mg/dL (ref 0.3–1.2)
BUN: 15 mg/dL (ref 6–23)
CO2: 23 mEq/L (ref 19–32)
Calcium: 8.7 mg/dL (ref 8.4–10.5)
Chloride: 103 mEq/L (ref 96–112)
Creatinine, Ser: 1.15 mg/dL — ABNORMAL HIGH (ref 0.50–1.10)
GFR calc Af Amer: 54 mL/min — ABNORMAL LOW (ref >90)
GFR calc non Af Amer: 47 mL/min — ABNORMAL LOW (ref >90)
GLUCOSE: 150 mg/dL — AB (ref 70–99)
Potassium: 3.8 mEq/L (ref 3.7–5.3)
SODIUM: 138 meq/L (ref 137–147)
TOTAL PROTEIN: 6.6 g/dL (ref 6.0–8.3)

## 2014-03-09 LAB — ETHANOL: Alcohol, Ethyl (B): <11 mg/dL (ref 0–11)

## 2014-03-09 LAB — DIFFERENTIAL
BASOS PCT: 0 % (ref 0–1)
Basophils Absolute: 0 10*3/uL (ref 0.0–0.1)
EOS ABS: 0.2 10*3/uL (ref 0.0–0.7)
Eosinophils Relative: 3 % (ref 0–5)
LYMPHS ABS: 1 10*3/uL (ref 0.7–4.0)
Lymphocytes Relative: 15 % (ref 12–46)
MONOS PCT: 9 % (ref 3–12)
Monocytes Absolute: 0.6 10*3/uL (ref 0.1–1.0)
NEUTROS ABS: 5.2 10*3/uL (ref 1.7–7.7)
Neutrophils Relative %: 73 % (ref 43–77)

## 2014-03-09 LAB — CBC
HCT: 34.7 % — ABNORMAL LOW (ref 36.0–46.0)
HEMOGLOBIN: 11.7 g/dL — AB (ref 12.0–15.0)
MCH: 30.8 pg (ref 26.0–34.0)
MCHC: 33.7 g/dL (ref 30.0–36.0)
MCV: 91.3 fL (ref 78.0–100.0)
Platelets: 133 10*3/uL — ABNORMAL LOW (ref 150–400)
RBC: 3.8 MIL/uL — AB (ref 3.87–5.11)
RDW: 14.1 % (ref 11.5–15.5)
WBC: 7.1 10*3/uL (ref 4.0–10.5)

## 2014-03-09 LAB — PROTIME-INR
INR: 1.19 (ref 0.00–1.49)
PROTHROMBIN TIME: 15.1 s (ref 11.6–15.2)

## 2014-03-09 LAB — APTT: APTT: 24 s (ref 24–37)

## 2014-03-09 LAB — TROPONIN I

## 2014-03-09 NOTE — ED Notes (Signed)
Pt ambulated, tolerated well.

## 2014-03-09 NOTE — ED Notes (Signed)
PT recently admitted and was d/c on Tuesday with a CVA. PT developed left sided weakness and some slurred speech around 1315 today. PT alert and oriented. PT has left sided facial droop present from previous stroke.

## 2014-03-09 NOTE — Progress Notes (Signed)
  Echocardiogram 2D Echocardiogram limited with saline has been performed.  [place], [place] 03/09/2014, 4:40 PM

## 2014-03-09 NOTE — Discharge Instructions (Signed)
Ischemic Stroke Blood carries oxygen to all areas of your body. A stroke happens when your blood does not flow to your brain like normal. If this happens, your brain will not get the oxygen it needs and brain tissue will die. This is an emergency. Problems (symptoms) of a stroke usually happen suddenly. You may notice them when you wake up. They can include:  Loss of feeling or weakness on one side of the body (face, arm, leg).  Feeling confused.  Trouble talking or understanding.  Trouble seeing.  Trouble walking.  Feeling dizzy.  Loss of balance or coordination.  Severe headache without a cause.  Trouble reading or writing. Get help as soon as any of these problems first start. This is important.  RISK FACTORS  Risk factors are things that make it more likely for you to have a stroke. These things include:  High blood pressure (hypertension).  High cholesterol.  Diabetes.  Heart disease.  Having a buildup of fatty deposits in the blood vessels.  Having an abnormal heart rhythm (atrial fibrillation).  Being very overweight (obese).  Smoking.  Taking birth control pills, especially if you smoke.  Not being active.  Having a diet high in fats, salt, and calories.  Drinking too much alcohol.  Using illegal drugs.  Being African American.  Being over the age of 11.  Having a family history of stroke.  Having a history of blood clots, stroke, warning stroke (transient ischemic attack, TIA), or heart attack.  Sickle cell disease. HOME CARE  Take all medicines exactly as told by your doctor. Understand all your medicine instructions.  You may need to take a medicine to thin your blood, like aspirin or warfarin. Take warfarin exactly as told.  Taking too much or too little warfarin is dangerous. Get regular blood tests as told, including the PT and INR tests. The test results help your doctor adjust your dose of warfarin. Your PT and INR levels must be done  as often as told by your doctor.  Food can cause problems with warfarin and affect the results of your blood tests. This is true for foods high in vitamin K, such as spinach, kale, broccoli, cabbage, collard and turnip greens, brussels sprouts, peas, cauliflower, seaweed, and parsley, as well as beef and pork liver, green tea, and soybean oil. Eat the same amount of food high in vitamin K. Avoid major changes in your diet. Tell your doctor before changing your diet. Talk to a food specialist (dietitian) if you have questions.  Many medicines can cause problems with warfarin and affect your PT and INR test results. Tell your doctor about all medicines you take. This includes vitamins and dietary pills (supplements). Be careful with aspirin and medicines that relieve redness, soreness, and puffiness (inflammation). Do not take or stop medicines unless your doctor tells you to.  Warfarin can cause a lot of bruising or bleeding. Hold pressure over cuts for longer than normal. Talk to your doctor about other side effects of warfarin.  Avoid sports or activities that may cause injury or bleeding.  Be careful when you shave, floss your teeth, or use sharp objects.  Avoid alcoholic drinks or drink very little alcohol while taking warfarin. Tell your doctor if you change how much alcohol you drink.  Tell your dentist and other doctors that you take warfarin before procedures.  If you are able to swallow, eat healthy foods. Eat 5 or more servings of fruits and vegetables a day. Eat soft  foods, pureed foods, or eat small bites of food so you do not choke.  Follow your diet program as told, if you are given one.  Keep a healthy weight.  Stay active. Try to get at least 30 minutes of activity on most or all days.  Do not smoke.  Limit how much alcohol you drink even if you are not taking warfarin. Moderate alcohol use is:  No more than 2 drinks each day for men.  No more than 1 drink each day for  women who are not pregnant.  Keep your home safe so you do not fall. Try:  Putting grab bars in the bedroom and bathroom.  Raising toilet seats.  Putting a seat in the shower.  Go to therapy sessions (physical, occupational, and speech) as told by your doctor.  Use a walker or cane at all times if told to do so.  Keep all doctor visits as told. GET HELP IF:  Your personality changes.  You have trouble swallowing.  You are seeing two of everything.  You are dizzy.  You have a fever.  Your skin starts to break down. GET HELP RIGHT AWAY IF:  The symptoms below may be a sign of an emergency. Do not wait to see if the symptoms go away. Call for help (911 in [place] Do not drive yourself to the hospital.  You have sudden weakness or numbness on the face, arm, or leg (especially on one side of the body).  You have sudden trouble walking or moving your arms or legs.  You have sudden confusion.  You have trouble talking or understanding.  You have sudden trouble seeing in one or both eyes.  You lose your balance or your movements are not smooth.  You have a sudden, severe headache with no known cause.  You have new chest pain or you feel your heart beating in a unsteady way.  You are partly or totally unaware of what is going on around you. Document Released: 08/14/2011 Document Revised: 08/30/2013 Document Reviewed: 04/04/2012 [facility] Patient Information 2015 [place], [place]. This information is not intended to replace advice given to you by your health care provider. Make sure you discuss any questions you have with your health care provider.

## 2014-03-09 NOTE — ED Provider Notes (Signed)
CSN: [id number]     Arrival date & time 03/09/14  1349 History   First MD Initiated Contact with Patient 03/09/14 1354     Chief Complaint  Patient presents with  . Weakness     The history is provided by the patient.  Patient reports numbness in bilateral hands.  She reports this started just prior to arrival.  She reports "difficulty with hands" bilaterally.  No other new weakness reported.  No leg weakness. No HA No visual changes.  No cp/sob.  No falls reported Her course is unchanged.  Nothing worsens her symptoms.  PT reports recent stroke that resulted in left sided facial weakness.  She reports this is unchanged.  She reports she feels her speech is worsening.    She has no other new complaints  Past Medical History  Diagnosis Date  . Back pain   . GERD (gastroesophageal reflux disease)   . MI (myocardial infarction)   . Hypercholesteremia   . Diabetes mellitus   . Anemia   . Cirrhosis   . Aortic sclerosis 07/04/2011  . Aortic insufficiency 07/04/2011    most recent 2D Echo 02/24/2012  EF greater than 55%  . Carotid stenosis, bilateral 07/04/2011    most recent 05/2010 carotid doppler  . OSA (obstructive sleep apnea) 07/04/2011    sleep study 2009  AHI 9.91/hr and during REM sleep 35.58/hr  . Presence of stent in right coronary artery 07/04/2011  . HTN (hypertension) 07/04/2011  . Coronary artery disease   . Myocardial infarction 1995  . GERD (gastroesophageal reflux disease)   . Non-alcoholic fatty liver disease   . UTI (lower urinary tract infection)     chronic/recurring/on bactrim   Past Surgical History  Procedure Laterality Date  . Back surgery  1985  . Tonsillectomy    . Appendectomy    . Dilation and curettage of uterus    . Colonoscopy    . Eye surgery  08    cataract surgery of the left eye  . Ercp    . Abdominal hysterectomy  1972  . Cardiac catheterization  1995.,2006,1997    stent to the proximal RCA after MI   . Myringotomy  2012    both  ears  . Hardware removal Right 01/17/2013    Procedure: REMOVAL OF HARDWARE AND EXCISION ULNAR STYLOID RIGHT WRIST;  Surgeon: Tennis Must, MD;  Location: [facility];  Service: Orthopedics;  Laterality: Right;  . Wrist surgery      rt wrist hardwear removal  . Esophagogastroduodenoscopy N/A 04/01/2013    Procedure: ESOPHAGOGASTRODUODENOSCOPY (EGD);  Surgeon: Rogene Houston, MD;  [facility];  Service: Endoscopy;  Laterality: N/A;  230-rescheduled to 8:30am Ann notified pt  . Esophageal banding N/A 04/01/2013    Procedure: ESOPHAGEAL BANDING;  Surgeon: Rogene Houston, MD;  [facility];  Service: Endoscopy;  Laterality: N/A;  . Esophagogastroduodenoscopy N/A 05/24/2013    Procedure: ESOPHAGOGASTRODUODENOSCOPY (EGD);  Surgeon: Rogene Houston, MD;  [facility];  Service: Endoscopy;  Laterality: N/A;  730  . Esophageal banding N/A 05/24/2013    Procedure: ESOPHAGEAL BANDING;  Surgeon: Rogene Houston, MD;  [facility];  Service: Endoscopy;  Laterality: N/A;   Family History  Problem Relation Age of Onset  . Diabetes Mother   . Heart failure Father    History  Substance Use Topics  . Smoking status: Former Smoker -- 0.25 packs/day    Types: Cigarettes  Quit date: 01/13/2012  . Smokeless tobacco: Never Used  . Alcohol Use: No   OB History   Grav Para Term Preterm Abortions TAB SAB Ect Mult Living                 Review of Systems  Constitutional: Negative for fever.  Respiratory: Negative for shortness of breath.   Cardiovascular: Negative for chest pain.  Gastrointestinal: Negative for vomiting.  Neurological: Positive for numbness.  All other systems reviewed and are negative.     Allergies  Tape  Home Medications   Prior to Admission medications   Medication Sig Start Date End Date Taking? Authorizing Provider  aspirin 81 MG tablet Take 81 mg by mouth daily.    Historical Provider, MD  carvedilol (COREG)  12.5 MG tablet Take 1 tablet (12.5 mg total) by mouth 2 (two) times daily. 12/09/13   Cecilie Kicks, NP  clopidogrel (PLAVIX) 75 MG tablet Take 1 tablet (75 mg total) by mouth daily. 03/07/14   Radene Gunning, NP  diphenhydrAMINE (BENADRYL) 50 MG tablet Take 50 mg by mouth at bedtime as needed for itching or sleep.    Historical Provider, MD  insulin glargine (LANTUS SOLOSTAR) 100 UNIT/ML injection Inject 72 Units into the skin at bedtime.     Historical Provider, MD  insulin NPH-insulin regular (NOVOLIN 70/30) (70-30) 100 UNIT/ML injection Inject 72 Units into the skin 2 (two) times daily with a meal.     Historical Provider, MD  isosorbide mononitrate (IMDUR) 30 MG 24 hr tablet Take 1 tablet (30 mg total) by mouth daily. 05/10/13   Pixie Casino, MD  omeprazole (PRILOSEC OTC) 20 MG tablet Take 20 mg by mouth daily.    Historical Provider, MD  ondansetron (ZOFRAN) 4 MG tablet Take 1 tablet (4 mg total) by mouth every 8 (eight) hours as needed for nausea or vomiting. 03/07/14   Radene Gunning, NP  rosuvastatin (CRESTOR) 40 MG tablet Take 1 tablet (40 mg total) by mouth at bedtime. 07/29/13   Pixie Casino, MD  sulfamethoxazole-trimethoprim (BACTRIM DS) 800-160 MG per tablet Take 1 tablet by mouth daily. 03/03/14   Historical Provider, MD  valsartan-hydrochlorothiazide (DIOVAN-HCT) 160-12.5 MG per tablet Take 1 tablet by mouth daily.    Historical Provider, MD  zolpidem (AMBIEN) 5 MG tablet Take 5 mg by mouth at bedtime as needed for sleep.    Historical Provider, MD   BP 110/48  Pulse 66  Temp(Src) 98.1 F (36.7 C) (Oral)  Resp 18  Ht 5' 3"  (1.6 m)  Wt 200 lb (90.719 kg)  BMI 35.44 kg/m2  SpO2 97% Physical Exam CONSTITUTIONAL: Well developed/well nourished HEAD: Normocephalic/atraumatic EYES: EOMI/PERRL ENMT: Mucous membranes moist NECK: supple no meningeal signs SPINE:entire spine nontender CV: S1/S2 noted, no murmurs/rubs/gallops noted LUNGS: Lungs are clear to auscultation bilaterally,  no apparent distress ABDOMEN: soft, nontender, no rebound or guarding GU:no cva tenderness NEURO: Pt is awake/alert, moves all extremitiesx4 No arm/leg drift.  No focal sensory deficit noted to extremities Left facial droop noted (baseline for patient)  There is mild dysarthria noted EXTREMITIES: pulses normal, full ROM SKIN: warm, color normal PSYCH: no abnormalities of mood noted  ED Course  Procedures   Pt seen on arrival for concern for stroke due to "fingers tingling" and worsened speech Pt just diagnosed with right MCA stroke on 03/06/14.  She had extensive workup at that time tPA in stroke considered but not given due to:  Recent CVA (absolute contraindication)  2:51 PM Discussed with dr Merlene Laughter, neurology who saw patient in hospital He would not recommend any further intervention other than CT head as had recent extensive workup and nothing is found to be reversible and she is not a TPA candidate.  She should continue her medications as directed.  He feels the "tingling" in her hands may be related to carpal tunnel and she should f/u for nerve conduction studies. He has reviewed CT findings and he would not recommend any further testing  3:42 PM Pt would prefer to go home (I offered admit for observation) We did perform bedside echo/bubble study (pt was having this done today and came to ER instead) Prelim report on echo is negative for PFO  Labs reassuring Pt ambulated She would like to go home Discussed need to continue ASA/Plavix Will f/u as outpatient with dr Merlene Laughter We discussed strict return precautions Labs Review Labs Reviewed  CBC - Abnormal; Notable for the following:    RBC 3.80 (*)    Hemoglobin 11.7 (*)    HCT 34.7 (*)    Platelets 133 (*)    All other components within normal limits  COMPREHENSIVE METABOLIC PANEL - Abnormal; Notable for the following:    Glucose, Bld 150 (*)    Creatinine, Ser 1.15 (*)    Albumin 3.3 (*)    AST 41 (*)    GFR calc  non Af Amer 47 (*)    GFR calc Af Amer 54 (*)    All other components within normal limits  ETHANOL  PROTIME-INR  APTT  DIFFERENTIAL  TROPONIN I    Imaging Review Ct Head Wo Contrast  03/09/2014   CLINICAL DATA:  Stroke  EXAM: CT HEAD WITHOUT CONTRAST  TECHNIQUE: Contiguous axial images were obtained from the base of the skull through the vertex without intravenous contrast.  COMPARISON:  CT 03/06/2014  FINDINGS: Right frontal operculum infarct shows progressive low-density but is similar in size to the prior CT and MRI. Hyperdense vessel in the posterior right sylvian fissure appears to have migrated since the prior study. MRI may be helpful to determine if this infarct has extended. There is no associated hemorrhage. No shift of the midline structures  Ventricle size is normal.  IMPRESSION: Acute infarct in the right frontal operculum similar in size to the prior study. No hemorrhage. New area of hyperdense thrombus in the posterior right MCA branch which may be due to thrombus migration. MRI may be helpful to determine if there has been infarct extension.   Electronically Signed   By: Franchot Gallo M.D.   On: 03/09/2014 14:39     EKG Interpretation   Date/Time:  Thursday March 09 2014 14:00:35 EDT Ventricular Rate:  67 PR Interval:  190 QRS Duration: 95 QT Interval:  461 QTC Calculation: 487 R Axis:   44 Text Interpretation:  Sinus rhythm Borderline prolonged QT interval  Confirmed by Christy Gentles  MD, Elenore Rota ([id number]) on 03/09/2014 2:20:34 PM      MDM   Final diagnoses:  Stroke    Nursing notes including past medical history and social history reviewed and considered in documentation Labs/vital reviewed and considered Previous records reviewed and considered     Sharyon Cable, MD 03/09/14 1611

## 2014-03-10 ENCOUNTER — Other Ambulatory Visit: Payer: Self-pay | Admitting: Internal Medicine

## 2014-03-13 NOTE — Telephone Encounter (Signed)
Rx was sent to pharmacy electronically. 

## 2014-03-14 ENCOUNTER — Other Ambulatory Visit: Payer: Self-pay | Admitting: *Deleted

## 2014-03-14 MED ORDER — VALSARTAN-HYDROCHLOROTHIAZIDE 160-12.5 MG PO TABS: 1.0000 | ORAL_TABLET | Freq: Every day | ORAL | Status: DC

## 2014-03-20 ENCOUNTER — Encounter (HOSPITAL_COMMUNITY): Payer: Self-pay | Admitting: Emergency Medicine

## 2014-03-20 ENCOUNTER — Inpatient Hospital Stay (HOSPITAL_COMMUNITY)
Admission: EM | Admit: 2014-03-20 | Discharge: 2014-03-22 | DRG: 684 | Disposition: A | Discharge status: Home / Self Care | Payer: Medicare Other | Attending: Internal Medicine | Admitting: Internal Medicine

## 2014-03-20 ENCOUNTER — Emergency Department (HOSPITAL_COMMUNITY): Payer: Medicare Other

## 2014-03-20 DIAGNOSIS — Z833 Family history of diabetes mellitus: Secondary | ICD-10-CM

## 2014-03-20 DIAGNOSIS — I10 Essential (primary) hypertension: Secondary | ICD-10-CM | POA: Diagnosis present

## 2014-03-20 DIAGNOSIS — K7460 Unspecified cirrhosis of liver: Secondary | ICD-10-CM | POA: Diagnosis present

## 2014-03-20 DIAGNOSIS — N289 Disorder of kidney and ureter, unspecified: Secondary | ICD-10-CM

## 2014-03-20 DIAGNOSIS — E860 Dehydration: Secondary | ICD-10-CM | POA: Diagnosis present

## 2014-03-20 DIAGNOSIS — D649 Anemia, unspecified: Secondary | ICD-10-CM | POA: Diagnosis present

## 2014-03-20 DIAGNOSIS — E876 Hypokalemia: Secondary | ICD-10-CM | POA: Diagnosis present

## 2014-03-20 DIAGNOSIS — N179 Acute kidney failure, unspecified: Principal | ICD-10-CM | POA: Diagnosis present

## 2014-03-20 DIAGNOSIS — I951 Orthostatic hypotension: Secondary | ICD-10-CM

## 2014-03-20 DIAGNOSIS — K921 Melena: Secondary | ICD-10-CM

## 2014-03-20 DIAGNOSIS — Z8249 Family history of ischemic heart disease and other diseases of the circulatory system: Secondary | ICD-10-CM

## 2014-03-20 DIAGNOSIS — E7800 Pure hypercholesterolemia, unspecified: Secondary | ICD-10-CM | POA: Diagnosis present

## 2014-03-20 DIAGNOSIS — I252 Old myocardial infarction: Secondary | ICD-10-CM

## 2014-03-20 DIAGNOSIS — Z7982 Long term (current) use of aspirin: Secondary | ICD-10-CM

## 2014-03-20 DIAGNOSIS — K7689 Other specified diseases of liver: Secondary | ICD-10-CM | POA: Diagnosis present

## 2014-03-20 DIAGNOSIS — E119 Type 2 diabetes mellitus without complications: Secondary | ICD-10-CM | POA: Diagnosis present

## 2014-03-20 DIAGNOSIS — D6959 Other secondary thrombocytopenia: Secondary | ICD-10-CM | POA: Diagnosis present

## 2014-03-20 DIAGNOSIS — I69998 Other sequelae following unspecified cerebrovascular disease: Secondary | ICD-10-CM

## 2014-03-20 DIAGNOSIS — K219 Gastro-esophageal reflux disease without esophagitis: Secondary | ICD-10-CM | POA: Diagnosis present

## 2014-03-20 DIAGNOSIS — K649 Unspecified hemorrhoids: Secondary | ICD-10-CM | POA: Diagnosis present

## 2014-03-20 DIAGNOSIS — I959 Hypotension, unspecified: Secondary | ICD-10-CM | POA: Diagnosis present

## 2014-03-20 DIAGNOSIS — G4733 Obstructive sleep apnea (adult) (pediatric): Secondary | ICD-10-CM | POA: Diagnosis present

## 2014-03-20 DIAGNOSIS — Z794 Long term (current) use of insulin: Secondary | ICD-10-CM

## 2014-03-20 DIAGNOSIS — Z87891 Personal history of nicotine dependence: Secondary | ICD-10-CM

## 2014-03-20 DIAGNOSIS — I2510 Atherosclerotic heart disease of native coronary artery without angina pectoris: Secondary | ICD-10-CM | POA: Diagnosis present

## 2014-03-20 LAB — CBC WITH DIFFERENTIAL/PLATELET
BASOS ABS: 0 10*3/uL (ref 0.0–0.1)
Basophils Relative: 0 % (ref 0–1)
EOS PCT: 3 % (ref 0–5)
Eosinophils Absolute: 0.2 10*3/uL (ref 0.0–0.7)
HEMATOCRIT: 37 % (ref 36.0–46.0)
Hemoglobin: 12.4 g/dL (ref 12.0–15.0)
LYMPHS PCT: 19 % (ref 12–46)
Lymphs Abs: 1.6 10*3/uL (ref 0.7–4.0)
MCH: 29.9 pg (ref 26.0–34.0)
MCHC: 33.5 g/dL (ref 30.0–36.0)
MCV: 89.2 fL (ref 78.0–100.0)
MONO ABS: 0.7 10*3/uL (ref 0.1–1.0)
Monocytes Relative: 9 % (ref 3–12)
Neutro Abs: 5.9 10*3/uL (ref 1.7–7.7)
Neutrophils Relative %: 69 % (ref 43–77)
Platelets: 159 10*3/uL (ref 150–400)
RBC: 4.15 MIL/uL (ref 3.87–5.11)
RDW: 13.5 % (ref 11.5–15.5)
WBC: 8.4 10*3/uL (ref 4.0–10.5)

## 2014-03-20 LAB — COMPREHENSIVE METABOLIC PANEL
ALT: 13 U/L (ref 0–35)
AST: 20 U/L (ref 0–37)
Albumin: 3.7 g/dL (ref 3.5–5.2)
Alkaline Phosphatase: 77 U/L (ref 39–117)
Anion gap: 15 (ref 5–15)
BUN: 28 mg/dL — ABNORMAL HIGH (ref 6–23)
CALCIUM: 9 mg/dL (ref 8.4–10.5)
CO2: 24 meq/L (ref 19–32)
Chloride: 101 mEq/L (ref 96–112)
Creatinine, Ser: 3.11 mg/dL — ABNORMAL HIGH (ref 0.50–1.10)
GFR calc non Af Amer: 14 mL/min — ABNORMAL LOW (ref >90)
GFR, EST AFRICAN AMERICAN: 16 mL/min — AB (ref >90)
Glucose, Bld: 189 mg/dL — ABNORMAL HIGH (ref 70–99)
Potassium: 3.2 mEq/L — ABNORMAL LOW (ref 3.7–5.3)
Sodium: 140 mEq/L (ref 137–147)
Total Bilirubin: 0.7 mg/dL (ref 0.3–1.2)
Total Protein: 6.7 g/dL (ref 6.0–8.3)

## 2014-03-20 LAB — POC OCCULT BLOOD, ED: FECAL OCCULT BLD: POSITIVE — AB

## 2014-03-20 LAB — GLUCOSE, CAPILLARY: Glucose-Capillary: 178 mg/dL — ABNORMAL HIGH (ref 70–99)

## 2014-03-20 LAB — URINALYSIS, ROUTINE W REFLEX MICROSCOPIC
Bilirubin Urine: NEGATIVE
Glucose, UA: NEGATIVE mg/dL
Hgb urine dipstick: NEGATIVE
Ketones, ur: NEGATIVE mg/dL
LEUKOCYTES UA: NEGATIVE
NITRITE: NEGATIVE
PROTEIN: NEGATIVE mg/dL
Specific Gravity, Urine: 1.015 (ref 1.005–1.030)
UROBILINOGEN UA: 0.2 mg/dL (ref 0.0–1.0)
pH: 5 (ref 5.0–8.0)

## 2014-03-20 LAB — I-STAT CG4 LACTIC ACID, ED: Lactic Acid, Venous: 2.88 mmol/L — ABNORMAL HIGH (ref 0.5–2.2)

## 2014-03-20 LAB — I-STAT TROPONIN, ED: Troponin i, poc: 0.03 ng/mL (ref 0.00–0.08)

## 2014-03-20 MED ORDER — CLOPIDOGREL BISULFATE 75 MG PO TABS
75.0000 mg | ORAL_TABLET | Freq: Every day | ORAL | Status: DC
  Administered 2014-03-21: 75 mg via ORAL
  Filled 2014-03-20: qty 1

## 2014-03-20 MED ORDER — ONDANSETRON HCL 4 MG/2ML IJ SOLN
4.0000 mg | Freq: Four times a day (QID) | INTRAMUSCULAR | Status: DC | PRN
  Administered 2014-03-21: 4 mg via INTRAVENOUS
  Filled 2014-03-20: qty 2

## 2014-03-20 MED ORDER — SODIUM CHLORIDE 0.9 % IV BOLUS (SEPSIS)
1000.0000 mL | Freq: Once | INTRAVENOUS | Status: AC
  Administered 2014-03-20: 1000 mL via INTRAVENOUS

## 2014-03-20 MED ORDER — ATORVASTATIN CALCIUM 40 MG PO TABS
80.0000 mg | ORAL_TABLET | Freq: Every day | ORAL | Status: DC
  Administered 2014-03-20 – 2014-03-22 (×3): 80 mg via ORAL
  Filled 2014-03-20 (×3): qty 2

## 2014-03-20 MED ORDER — CARVEDILOL 12.5 MG PO TABS
12.5000 mg | ORAL_TABLET | Freq: Two times a day (BID) | ORAL | Status: DC
  Administered 2014-03-21 – 2014-03-22 (×4): 12.5 mg via ORAL
  Filled 2014-03-20 (×5): qty 1

## 2014-03-20 MED ORDER — SODIUM CHLORIDE 0.9 % IV BOLUS (SEPSIS)
2000.0000 mL | Freq: Once | INTRAVENOUS | Status: AC
  Administered 2014-03-20: 2000 mL via INTRAVENOUS

## 2014-03-20 MED ORDER — ONDANSETRON HCL 4 MG PO TABS: 4.0000 mg | ORAL_TABLET | Freq: Four times a day (QID) | ORAL | Status: DC | PRN

## 2014-03-20 MED ORDER — INSULIN DETEMIR 100 UNIT/ML ~~LOC~~ SOLN
25.0000 [IU] | Freq: Every day | SUBCUTANEOUS | Status: DC
  Administered 2014-03-20 – 2014-03-21 (×2): 25 [IU] via SUBCUTANEOUS
  Filled 2014-03-20 (×3): qty 0.25

## 2014-03-20 MED ORDER — SODIUM CHLORIDE 0.9 % IV SOLN
INTRAVENOUS | Status: DC
  Administered 2014-03-20 – 2014-03-21 (×4): via INTRAVENOUS

## 2014-03-20 MED ORDER — ISOSORBIDE MONONITRATE ER 60 MG PO TB24
30.0000 mg | ORAL_TABLET | Freq: Every day | ORAL | Status: DC
  Administered 2014-03-21 – 2014-03-22 (×2): 30 mg via ORAL
  Filled 2014-03-20 (×2): qty 1

## 2014-03-20 MED ORDER — OMEPRAZOLE MAGNESIUM 20 MG PO TBEC: 20.0000 mg | DELAYED_RELEASE_TABLET | Freq: Every day | ORAL | Status: DC

## 2014-03-20 MED ORDER — ZOLPIDEM TARTRATE 5 MG PO TABS
5.0000 mg | ORAL_TABLET | Freq: Every day | ORAL | Status: DC
  Administered 2014-03-20 – 2014-03-21 (×2): 5 mg via ORAL
  Filled 2014-03-20 (×2): qty 1

## 2014-03-20 MED ORDER — INSULIN ASPART 100 UNIT/ML ~~LOC~~ SOLN
0.0000 [IU] | Freq: Three times a day (TID) | SUBCUTANEOUS | Status: DC
  Administered 2014-03-21: 2 [IU] via SUBCUTANEOUS
  Administered 2014-03-21: 3 [IU] via SUBCUTANEOUS
  Administered 2014-03-22: 2 [IU] via SUBCUTANEOUS
  Administered 2014-03-22: 3 [IU] via SUBCUTANEOUS

## 2014-03-20 MED ORDER — INSULIN ASPART 100 UNIT/ML ~~LOC~~ SOLN: 0.0000 [IU] | Freq: Every day | SUBCUTANEOUS | Status: DC

## 2014-03-20 MED ORDER — PANTOPRAZOLE SODIUM 40 MG PO TBEC
40.0000 mg | DELAYED_RELEASE_TABLET | Freq: Every day | ORAL | Status: DC
  Administered 2014-03-21 – 2014-03-22 (×2): 40 mg via ORAL
  Filled 2014-03-20 (×2): qty 1

## 2014-03-20 MED ORDER — SODIUM CHLORIDE 0.9 % IV SOLN: INTRAVENOUS | Status: DC

## 2014-03-20 MED ORDER — ACETAMINOPHEN 325 MG PO TABS
650.0000 mg | ORAL_TABLET | ORAL | Status: DC | PRN
  Administered 2014-03-20: 650 mg via ORAL
  Filled 2014-03-20: qty 2

## 2014-03-20 NOTE — ED Provider Notes (Signed)
CSN: [id number]     Arrival date & time 03/20/14  1505 History  This chart was scribed for Babette Relic, MD by Elby Beck, ED Scribe. This patient was seen in room APA03/APA03 and the patient's care was started at 3:08 PM.   Chief Complaint  Patient presents with  . Fatigue    The history is provided by the patient. No language interpreter was used.    HPI Comments: [name] is a 73 y.o. female recently hospitalized for stroke who has baseline slurred speech, facial droop and weakness (unchanged) who presents to the Emergency Department complaining of generalized weakness/lightheaded sensation when she stands up with near-syncope today. No new focal neuro symptoms. No headache, no confusion, no chest pain or SOB. No fever or cough. Just generally weak. She did have a fall with no other symptoms a few days ago and she has a bruise on the left side of her forehead. She takes Plavix. No headache, no neck pain. Today she had an urge to have a bowel movement, could not push out any stool, but did pass some bright red blood that was painless.     Past Medical History  Diagnosis Date  . Back pain   . GERD (gastroesophageal reflux disease)   . MI (myocardial infarction)   . Hypercholesteremia   . Diabetes mellitus   . Anemia   . Cirrhosis   . Aortic sclerosis 07/04/2011  . Aortic insufficiency 07/04/2011    most recent 2D Echo 02/24/2012  EF greater than 55%  . Carotid stenosis, bilateral 07/04/2011    most recent 05/2010 carotid doppler  . OSA (obstructive sleep apnea) 07/04/2011    sleep study 2009  AHI 9.91/hr and during REM sleep 35.58/hr  . Presence of stent in right coronary artery 07/04/2011  . HTN (hypertension) 07/04/2011  . Coronary artery disease   . Myocardial infarction 1995  . GERD (gastroesophageal reflux disease)   . Non-alcoholic fatty liver disease   . UTI (lower urinary tract infection)     chronic/recurring/on bactrim   Past Surgical History  Procedure  Laterality Date  . Back surgery  1985  . Tonsillectomy    . Appendectomy    . Dilation and curettage of uterus    . Colonoscopy    . Eye surgery  08    cataract surgery of the left eye  . Ercp    . Abdominal hysterectomy  1972  . Cardiac catheterization  1995.,2006,1997    stent to the proximal RCA after MI   . Myringotomy  2012    both ears  . Hardware removal Right 01/17/2013    Procedure: REMOVAL OF HARDWARE AND EXCISION ULNAR STYLOID RIGHT WRIST;  Surgeon: Tennis Must, MD;  Location: [place];  Service: Orthopedics;  Laterality: Right;  . Wrist surgery      rt wrist hardwear removal  . Esophagogastroduodenoscopy N/A 04/01/2013    Procedure: ESOPHAGOGASTRODUODENOSCOPY (EGD);  Surgeon: Rogene Houston, MD;  [facility];  Service: Endoscopy;  Laterality: N/A;  230-rescheduled to 8:30am Ann notified pt  . Esophageal banding N/A 04/01/2013    Procedure: ESOPHAGEAL BANDING;  Surgeon: Rogene Houston, MD;  [facility];  Service: Endoscopy;  Laterality: N/A;  . Esophagogastroduodenoscopy N/A 05/24/2013    Procedure: ESOPHAGOGASTRODUODENOSCOPY (EGD);  Surgeon: Rogene Houston, MD;  [facility];  Service: Endoscopy;  Laterality: N/A;  730  . Esophageal banding N/A 05/24/2013    Procedure: ESOPHAGEAL  BANDING;  Surgeon: Rogene Houston, MD;  [facility];  Service: Endoscopy;  Laterality: N/A;   Family History  Problem Relation Age of Onset  . Diabetes Mother   . Heart failure Father    History  Substance Use Topics  . Smoking status: Former Smoker -- 0.25 packs/day    Types: Cigarettes    Quit date: 01/13/2012  . Smokeless tobacco: Never Used  . Alcohol Use: No   OB History   Grav Para Term Preterm Abortions TAB SAB Ect Mult Living                 Review of Systems 10 Systems reviewed and all are negative for acute change except as noted in the HPI.   Allergies  Tape  Home Medications   Prior to Admission  medications   Medication Sig Start Date End Date Taking? Authorizing Provider  aspirin 81 MG tablet Take 81 mg by mouth daily.   Yes Historical Provider, MD  carvedilol (COREG) 12.5 MG tablet Take 1 tablet (12.5 mg total) by mouth 2 (two) times daily. 12/09/13  Yes Cecilie Kicks, NP  clopidogrel (PLAVIX) 75 MG tablet Take 1 tablet (75 mg total) by mouth daily. 03/07/14  Yes Lezlie Octave Black, NP  insulin glargine (LANTUS SOLOSTAR) 100 UNIT/ML injection Inject 72 Units into the skin at bedtime.    Yes Historical Provider, MD  insulin NPH-insulin regular (NOVOLIN 70/30) (70-30) 100 UNIT/ML injection Inject 72 Units into the skin 2 (two) times daily with a meal. After lunch and after supper.   Yes Historical Provider, MD  isosorbide mononitrate (IMDUR) 30 MG 24 hr tablet Take 1 tablet (30 mg total) by mouth daily.   Yes Pixie Casino, MD  omeprazole (PRILOSEC OTC) 20 MG tablet Take 20 mg by mouth daily.   Yes Historical Provider, MD  rosuvastatin (CRESTOR) 40 MG tablet Take 1 tablet (40 mg total) by mouth at bedtime. 07/29/13  Yes Pixie Casino, MD  zolpidem (AMBIEN) 5 MG tablet Take 5 mg by mouth at bedtime.    Yes Historical Provider, MD   BP 122/43  Pulse 65  Temp(Src) 98.3 F (36.8 C) (Oral)  Resp 20  Ht 5' 3"  (1.6 m)  Wt 186 lb 15.2 oz (84.8 kg)  BMI 33.13 kg/m2  SpO2 97%  Physical Exam  Nursing note and vitals reviewed. Constitutional:  Awake, alert, nontoxic appearance.  HENT:  Head: Atraumatic.  Eyes: Right eye exhibits no discharge. Left eye exhibits no discharge.  Neck: Neck supple.  Cardiovascular: Normal rate and regular rhythm.   No murmur heard. Pulmonary/Chest: Effort normal and breath sounds normal. No respiratory distress. She has no wheezes. She has no rales. She exhibits no tenderness.  Abdominal: Soft. Bowel sounds are normal. She exhibits no distension and no mass. There is no tenderness. There is no rebound and no guarding.  Musculoskeletal: She exhibits no  tenderness.  Baseline ROM, no obvious new focal weakness.  Neurological: She is alert.  Mental status and motor strength appears baseline for patient and situation. Baseline slightly slurred speech with facial droop.  Skin: No rash noted.  Psychiatric: She has a normal mood and affect.    ED Course  Procedures (including critical care time) D/w [facility] for admit.  DIAGNOSTIC STUDIES: Oxygen Saturation is 97% on room air, normal by my interpretation.    COORDINATION OF CARE: 3:14 PM- Patient / Family / Caregiver understand and agree with initial ED impression and plan with expectations  set for ED visit. Patient / Family / Caregiver informed of clinical course, understand medical decision-making process, and agree with plan.d/w [facility] for admit.   Labs Review Labs Reviewed  COMPREHENSIVE METABOLIC PANEL - Abnormal; Notable for the following:    Potassium 3.2 (*)    Glucose, Bld 189 (*)    BUN 28 (*)    Creatinine, Ser 3.11 (*)    GFR calc non Af Amer 14 (*)    GFR calc Af Amer 16 (*)    All other components within normal limits  CBC - Abnormal; Notable for the following:    RBC 3.52 (*)    Hemoglobin 10.6 (*)    HCT 31.6 (*)    Platelets 108 (*)    All other components within normal limits  COMPREHENSIVE METABOLIC PANEL - Abnormal; Notable for the following:    Potassium 2.9 (*)    Creatinine, Ser 2.28 (*)    Calcium 8.1 (*)    Total Protein 5.7 (*)    Albumin 3.1 (*)    GFR calc non Af Amer 20 (*)    GFR calc Af Amer 24 (*)    All other components within normal limits  GLUCOSE, CAPILLARY - Abnormal; Notable for the following:    Glucose-Capillary 178 (*)    All other components within normal limits  GLUCOSE, CAPILLARY - Abnormal; Notable for the following:    Glucose-Capillary 100 (*)    All other components within normal limits  GLUCOSE, CAPILLARY - Abnormal; Notable for the following:    Glucose-Capillary 196 (*)    All other components within normal limits  CBC -  Abnormal; Notable for the following:    RBC 3.54 (*)    Hemoglobin 10.7 (*)    HCT 31.8 (*)    Platelets 94 (*)    All other components within normal limits  GLUCOSE, CAPILLARY - Abnormal; Notable for the following:    Glucose-Capillary 122 (*)    All other components within normal limits  CBC - Abnormal; Notable for the following:    WBC 3.7 (*)    RBC 3.48 (*)    Hemoglobin 10.4 (*)    HCT 31.4 (*)    Platelets 95 (*)    All other components within normal limits  COMPREHENSIVE METABOLIC PANEL - Abnormal; Notable for the following:    Sodium 148 (*)    Potassium 3.6 (*)    Chloride 113 (*)    Glucose, Bld 109 (*)    Creatinine, Ser 1.69 (*)    Calcium 8.0 (*)    Total Protein 5.7 (*)    Albumin 3.1 (*)    GFR calc non Af Amer 29 (*)    GFR calc Af Amer 34 (*)    All other components within normal limits  GLUCOSE, CAPILLARY - Abnormal; Notable for the following:    Glucose-Capillary 111 (*)    All other components within normal limits  GLUCOSE, CAPILLARY - Abnormal; Notable for the following:    Glucose-Capillary 158 (*)    All other components within normal limits  GLUCOSE, CAPILLARY - Abnormal; Notable for the following:    Glucose-Capillary 177 (*)    All other components within normal limits  GLUCOSE, CAPILLARY - Abnormal; Notable for the following:    Glucose-Capillary 149 (*)    All other components within normal limits  POC OCCULT BLOOD, ED - Abnormal; Notable for the following:    Fecal Occult Bld POSITIVE (*)    All other components  within normal limits  I-STAT CG4 LACTIC ACID, ED - Abnormal; Notable for the following:    Lactic Acid, Venous 2.88 (*)    All other components within normal limits  CBC WITH DIFFERENTIAL  URINALYSIS, ROUTINE W REFLEX MICROSCOPIC  I-STAT TROPOININ, ED  I-STAT TROPOININ, ED    Imaging Review No results found.   EKG Interpretation None      MDM   Final diagnoses:  Renal insufficiency  Orthostatic hypotension   Hematochezia    The patient appears reasonably stabilized for admission considering the current resources, flow, and capabilities available in the ED at this time, and I doubt any other [facility] requiring further screening and/or treatment in the ED prior to admission.  I personally performed the services described in this documentation, which was scribed in my presence. The recorded information has been reviewed and is accurate.  Babette Relic, MD 03/24/14 1754

## 2014-03-20 NOTE — ED Notes (Signed)
Pt BIB EMS from home for weakness/lightheaded after evaluated by her [facility] RN. Pt had CVA approx 3 weeks ago, leaving her with left side facial droop and left side weakness. Pt reports she has fallen in the last few days and has a green/yellow bruise to left frontal/temporal area. EMS gave 239m NS IV prior to ED arrival.

## 2014-03-20 NOTE — H&P (Addendum)
[facility] History and Physical  [name] UUV:[id number] DOB: September 10, 1940 DOA: 03/20/2014  Referring physician: ER. PCP: Leonides Grills, MD   Chief Complaint: Dizziness.  HPI: [name] is a 73 y.o. female  This is a 73 year old lady who had an episode of dizziness approximately 7 hours ago and she fell from a sitting position. She did not lose consciousness. She was then brought to the emergency room when she was found to have low blood pressure and was in acute renal failure. She is now being admitted for further management. She says that she has not had a good appetite since her stroke at the end of June and she has not had good by mouth intake including fluids.   Review of Systems:  Constitutional:  No weight loss, night sweats, Fevers, chills, fatigue.  HEENT:  No headaches, Difficulty swallowing,Tooth/dental problems,Sore throat,  No sneezing, itching, ear ache, nasal congestion, post nasal drip,  Cardio-vascular:  No chest pain, Orthopnea, PND, swelling in lower extremities, anasarca,  palpitations  GI:  No heartburn, indigestion, abdominal pain, nausea, vomiting, diarrhea, change in bowel habits, loss of appetite  Resp:  No shortness of breath with exertion or at rest. No excess mucus, no productive cough, No non-productive cough, No coughing up of blood.No change in color of mucus.No wheezing.No chest wall deformity  Skin:  no rash or lesions.  GU:  no dysuria, change in color of urine, no urgency or frequency. No flank pain.  Musculoskeletal:  No joint pain or swelling. No decreased range of motion. No back pain.  Psych:  No change in mood or affect. No depression or anxiety. No memory loss.   Past Medical History  Diagnosis Date  . Back pain   . GERD (gastroesophageal reflux disease)   . MI (myocardial infarction)   . Hypercholesteremia   . Diabetes mellitus   . Anemia   . Cirrhosis   . Aortic sclerosis 07/04/2011  . Aortic insufficiency  07/04/2011    most recent 2D Echo 02/24/2012  EF greater than 55%  . Carotid stenosis, bilateral 07/04/2011    most recent 05/2010 carotid doppler  . OSA (obstructive sleep apnea) 07/04/2011    sleep study 2009  AHI 9.91/hr and during REM sleep 35.58/hr  . Presence of stent in right coronary artery 07/04/2011  . HTN (hypertension) 07/04/2011  . Coronary artery disease   . Myocardial infarction 1995  . GERD (gastroesophageal reflux disease)   . Non-alcoholic fatty liver disease   . UTI (lower urinary tract infection)     chronic/recurring/on bactrim   Past Surgical History  Procedure Laterality Date  . Back surgery  1985  . Tonsillectomy    . Appendectomy    . Dilation and curettage of uterus    . Colonoscopy    . Eye surgery  08    cataract surgery of the left eye  . Ercp    . Abdominal hysterectomy  1972  . Cardiac catheterization  1995.,2006,1997    stent to the proximal RCA after MI   . Myringotomy  2012    both ears  . Hardware removal Right 01/17/2013    Procedure: REMOVAL OF HARDWARE AND EXCISION ULNAR STYLOID RIGHT WRIST;  Surgeon: Tennis Must, MD;  Location: [place];  Service: Orthopedics;  Laterality: Right;  . Wrist surgery      rt wrist hardwear removal  . Esophagogastroduodenoscopy N/A 04/01/2013    Procedure: ESOPHAGOGASTRODUODENOSCOPY (EGD);  Surgeon: Rogene Houston, MD;  [facility];  Service: Endoscopy;  Laterality: N/A;  230-rescheduled to 8:30am Ann notified pt  . Esophageal banding N/A 04/01/2013    Procedure: ESOPHAGEAL BANDING;  Surgeon: Rogene Houston, MD;  [facility];  Service: Endoscopy;  Laterality: N/A;  . Esophagogastroduodenoscopy N/A 05/24/2013    Procedure: ESOPHAGOGASTRODUODENOSCOPY (EGD);  Surgeon: Rogene Houston, MD;  [facility];  Service: Endoscopy;  Laterality: N/A;  730  . Esophageal banding N/A 05/24/2013    Procedure: ESOPHAGEAL BANDING;  Surgeon: Rogene Houston, MD;  [facility];  Service: Endoscopy;  Laterality: N/A;   Social History:  reports that she quit smoking about 2 years ago. Her smoking use included Cigarettes. She smoked 0.25 packs per day. She has never used smokeless tobacco. She reports that she does not drink alcohol or use illicit drugs.  Allergies  Allergen Reactions  . Tape Rash    ADHESIVE TAPE    Family History  Problem Relation Age of Onset  . Diabetes Mother   . Heart failure Father      Prior to Admission medications   Medication Sig Start Date End Date Taking? Authorizing Provider  aspirin 81 MG tablet Take 81 mg by mouth daily.   Yes Historical Provider, MD  carvedilol (COREG) 12.5 MG tablet Take 1 tablet (12.5 mg total) by mouth 2 (two) times daily. 12/09/13  Yes Cecilie Kicks, NP  clopidogrel (PLAVIX) 75 MG tablet Take 1 tablet (75 mg total) by mouth daily. 03/07/14  Yes Lezlie Octave Black, NP  insulin glargine (LANTUS SOLOSTAR) 100 UNIT/ML injection Inject 72 Units into the skin at bedtime.    Yes Historical Provider, MD  insulin NPH-insulin regular (NOVOLIN 70/30) (70-30) 100 UNIT/ML injection Inject 72 Units into the skin 2 (two) times daily with a meal. After lunch and after supper.   Yes Historical Provider, MD  isosorbide mononitrate (IMDUR) 30 MG 24 hr tablet Take 1 tablet (30 mg total) by mouth daily.   Yes Pixie Casino, MD  omeprazole (PRILOSEC OTC) 20 MG tablet Take 20 mg by mouth daily.   Yes Historical Provider, MD  rosuvastatin (CRESTOR) 40 MG tablet Take 1 tablet (40 mg total) by mouth at bedtime. 07/29/13  Yes Pixie Casino, MD  valsartan-hydrochlorothiazide (DIOVAN-HCT) 160-12.5 MG per tablet Take 1 tablet by mouth daily. 03/14/14  Yes Pixie Casino, MD  zolpidem (AMBIEN) 5 MG tablet Take 5 mg by mouth at bedtime.    Yes Historical Provider, MD   Physical Exam: Filed Vitals:   03/20/14 1519  BP: 94/31  Pulse: 61  Temp: 98 F (36.7 C)  Resp: 16    BP 94/31  Pulse 61  Temp(Src) 98 F (36.7 C) (Oral)   Resp 16  Ht 5' 3"  (1.6 m)  Wt 90.719 kg (200 lb)  BMI 35.44 kg/m2  SpO2 92%  General:  Appears calm and comfortable. She looks somewhat clinically dehydrated. Eyes: PERRL, normal lids, irises & conjunctiva ENT: grossly normal hearing, lips & tongue Neck: no LAD, masses or thyromegaly Cardiovascular: RRR, no m/r/g. No LE edema. Telemetry: SR, no arrhythmias  Respiratory: CTA bilaterally, no w/r/r. Normal respiratory effort. Abdomen: soft, ntnd Skin: no rash or induration seen on limited exam Musculoskeletal: grossly normal tone BUE/BLE Psychiatric: grossly normal mood and affect, speech fluent and appropriate Neurologic: grossly non-focal.          Labs on Admission:  Basic Metabolic Panel:  Recent Labs Lab 03/20/14 1522  NA  140  K 3.2*  CL 101  CO2 24  GLUCOSE 189*  BUN 28*  CREATININE 3.11*  CALCIUM 9.0   Liver Function Tests:  Recent Labs Lab 03/20/14 1522  AST 20  ALT 13  ALKPHOS 77  BILITOT 0.7  PROT 6.7  ALBUMIN 3.7   No results found for this basename: LIPASE, AMYLASE,  in the last 168 hours No results found for this basename: AMMONIA,  in the last 168 hours CBC:  Recent Labs Lab 03/20/14 1522  WBC 8.4  NEUTROABS 5.9  HGB 12.4  HCT 37.0  MCV 89.2  PLT 159   Cardiac Enzymes: No results found for this basename: CKTOTAL, CKMB, CKMBINDEX, TROPONINI,  in the last 168 hours  BNP (last 3 results) No results found for this basename: PROBNP,  in the last 8760 hours CBG: No results found for this basename: GLUCAP,  in the last 168 hours  Radiological Exams on Admission: Ct Head Wo Contrast  03/20/2014   CLINICAL DATA:  Weakness, lightheadedness. Recent CVA with residual left-sided weakness.  EXAM: CT HEAD WITHOUT CONTRAST  TECHNIQUE: Contiguous axial images were obtained from the base of the skull through the vertex without intravenous contrast.  COMPARISON:  03/09/2014.  FINDINGS: No evidence of an acute infarct, acute hemorrhage, mass lesion,  mass effect or hydrocephalus. Slight improvement in low-attenuation in the posterior right frontal lobe. No mass effect.  Mild atrophy. Remote lacunar infarct or dilated perivascular space in the right basal ganglia. Visualized portions of the paranasal sinuses and mastoid air cells are clear.  IMPRESSION: 1. No acute findings. 2. Expected evolutionary changes of a now subacute right middle cerebral artery infarct.   Electronically Signed   By: Lorin Picket M.D.   On: 03/20/2014 16:38      Assessment/Plan   1. Acute renal failure secondary to poor by mouth intake. 2. Fecal occult blood positive with a normal hemoglobin. There is no clinical evidence of an acute GI bleed. She has been taking aspirin. 3. History of coronary artery disease, stable. 4. Diabetes mellitus. 5. Hypertension. 6. Cirrhosis of the liver, stable.  Plan: 1. Admit to medical floor. 2. Intravenous fluids. 3. Hold ARB/HCTZ combination as this is probably contributing to her acute renal failure. 4. Sliding scale of insulin to control diabetes. 5. Follow hemoglobin in case this is also a GI bleed, although I doubt this.  Further recommendations will depend on patient's hospital progress.    Code Status: Full code   Family Communication: I discussed the plan with the patient at the bedside.   Disposition Plan: Home when medically stable.   Time spent: 60 minutes.  Doree Albee [facility] Pager [phone].  **Disclaimer: This note may have been dictated with voice recognition software. Similar sounding words can inadvertently be transcribed and this note may contain transcription errors which may not have been corrected upon publication of note.**

## 2014-03-21 DIAGNOSIS — N289 Disorder of kidney and ureter, unspecified: Secondary | ICD-10-CM

## 2014-03-21 LAB — CBC
HCT: 31.6 % — ABNORMAL LOW (ref 36.0–46.0)
HCT: 31.8 % — ABNORMAL LOW (ref 36.0–46.0)
HEMOGLOBIN: 10.7 g/dL — AB (ref 12.0–15.0)
Hemoglobin: 10.6 g/dL — ABNORMAL LOW (ref 12.0–15.0)
MCH: 30.1 pg (ref 26.0–34.0)
MCH: 30.2 pg (ref 26.0–34.0)
MCHC: 33.5 g/dL (ref 30.0–36.0)
MCHC: 33.6 g/dL (ref 30.0–36.0)
MCV: 89.8 fL (ref 78.0–100.0)
MCV: 89.8 fL (ref 78.0–100.0)
PLATELETS: 108 10*3/uL — AB (ref 150–400)
Platelets: 94 10*3/uL — ABNORMAL LOW (ref 150–400)
RBC: 3.52 MIL/uL — ABNORMAL LOW (ref 3.87–5.11)
RBC: 3.54 MIL/uL — ABNORMAL LOW (ref 3.87–5.11)
RDW: 13.7 % (ref 11.5–15.5)
RDW: 13.5 % (ref 11.5–15.5)
WBC: 5.6 10*3/uL (ref 4.0–10.5)
WBC: 4.4 10*3/uL (ref 4.0–10.5)

## 2014-03-21 LAB — GLUCOSE, CAPILLARY
GLUCOSE-CAPILLARY: 100 mg/dL — AB (ref 70–99)
Glucose-Capillary: 196 mg/dL — ABNORMAL HIGH (ref 70–99)
Glucose-Capillary: 122 mg/dL — ABNORMAL HIGH (ref 70–99)

## 2014-03-21 LAB — COMPREHENSIVE METABOLIC PANEL
ALBUMIN: 3.1 g/dL — AB (ref 3.5–5.2)
ALT: 9 U/L (ref 0–35)
AST: 17 U/L (ref 0–37)
Alkaline Phosphatase: 62 U/L (ref 39–117)
Anion gap: 13 (ref 5–15)
BILIRUBIN TOTAL: 0.5 mg/dL (ref 0.3–1.2)
BUN: 23 mg/dL (ref 6–23)
CHLORIDE: 109 meq/L (ref 96–112)
CO2: 24 mEq/L (ref 19–32)
Calcium: 8.1 mg/dL — ABNORMAL LOW (ref 8.4–10.5)
Creatinine, Ser: 2.28 mg/dL — ABNORMAL HIGH (ref 0.50–1.10)
GFR calc Af Amer: 24 mL/min — ABNORMAL LOW (ref >90)
GFR calc non Af Amer: 20 mL/min — ABNORMAL LOW (ref >90)
Glucose, Bld: 99 mg/dL (ref 70–99)
Potassium: 2.9 mEq/L — CL (ref 3.7–5.3)
Sodium: 146 mEq/L (ref 137–147)
Total Protein: 5.7 g/dL — ABNORMAL LOW (ref 6.0–8.3)

## 2014-03-21 MED ORDER — ASPIRIN EC 81 MG PO TBEC
81.0000 mg | DELAYED_RELEASE_TABLET | Freq: Every day | ORAL | Status: DC
Start: 2014-03-21 — End: 2014-03-22
  Administered 2014-03-21 – 2014-03-22 (×2): 81 mg via ORAL
  Filled 2014-03-21 (×2): qty 1

## 2014-03-21 MED ORDER — POTASSIUM CHLORIDE CRYS ER 20 MEQ PO TBCR
40.0000 meq | EXTENDED_RELEASE_TABLET | ORAL | Status: AC
  Administered 2014-03-21 (×2): 40 meq via ORAL
  Filled 2014-03-21 (×2): qty 2

## 2014-03-21 NOTE — Care Management Utilization Note (Signed)
UR completed 

## 2014-03-21 NOTE — Progress Notes (Addendum)
CRITICAL VALUE ALERT  Critical value received:  K 2.9  Date of notification:  03/21/14  Time of notification:  0715  Critical value read back:Yes.    Nurse who received alert:  Donnella Bi, RN  MD notified (1st page):  Dr. Cruzita Lederer  Time of first page:  0800   MD notified (2nd page):  Time of second page:  Responding MD:  Dr. Cruzita Lederer  Time MD responded:  [id number]

## 2014-03-21 NOTE — Progress Notes (Signed)
PROGRESS NOTE  [name] QMG:[id number] DOB: 1941-08-31 DOA: 03/20/2014 PCP: Leonides Grills, MD  HPI: 73 yo f admitted 7/13 with dizziness, also with hypotension and acute renal failure on admission.   Subjective: - no complaints this morning, feeling well  Assessment/Plan:  Acute renal failure - multifactorial, poor po intake, ARB/HCTZ - improving with fluids.  Positive FOBT - patient without obvious clinical signs of bleeding, no reported blood or melena. Mild nausea but no vomiting. I am concerned about her recent CVA and that she is on 2 antiplatelet agents. I have kindly asked GI to evaluate as well, ?ongoing subclinical GI bleed. Continue PPI. Hb with drop overnight but so did her other lines, ?some dilutional component. Hold Plavix and ASA until evaluated. Liver cirrhosis - appears compensated currently. Thrombocytopenia - due to liver disease Anemia - positive FOBT, ?ongoing small GI source vs dilutional Recent CVA - stable, no new focal findings - hold antiplatelets for now DM - continue current regimen. Last A1C 7.5 on 6/29 HTN - will need to come off of Diovan, monitor BP for now.  CAD - clinically stable   Diet: carb modified Fluids: NS at 125 cc/h DVT Prophylaxis: SCD  Code Status: Full Family Communication: d/w patient  Disposition Plan: home when ready   Consultants:  GI  Procedures:  None    Antibiotics - none   Objective: Filed Vitals:   03/20/14 1519 03/20/14 2109 03/20/14 2216 03/21/14 0705  BP: 94/31 104/40 89/39 119/42  Pulse: 61 88 87 71  Temp: 98 F (36.7 C) 98.8 F (37.1 C) 98.3 F (36.8 C) 98.4 F (36.9 C)  TempSrc: Oral Oral Oral Oral  Resp: 16  16 20   Height: 5' 3"  (1.6 m) 5' 3"  (1.6 m)    Weight: 90.719 kg (200 lb) 84.8 kg (186 lb 15.2 oz)    SpO2: 92% 96% 94% 96%    Intake/Output Summary (Last 24 hours) at 03/21/14 0917 Last data filed at 03/21/14 0700  Gross per 24 hour  Intake 979.17 ml  Output    700 ml  Net  279.17 ml   Filed Weights   03/20/14 1519 03/20/14 2109  Weight: 90.719 kg (200 lb) 84.8 kg (186 lb 15.2 oz)   Exam:  General:  NAD  Cardiovascular: regular rate and rhythm, without MRG  Respiratory: good air movement, clear to auscultation throughout, no wheezing, ronchi or rales  Abdomen: soft, not tender to palpation, positive bowel sounds  MSK: no peripheral edema  Neuro: slurred speech and mild facial asymmetry, strength 5/5 in all 4.  Data Reviewed: Basic Metabolic Panel:  Recent Labs Lab 03/20/14 1522 03/21/14 0628  NA 140 146  K 3.2* 2.9*  CL 101 109  CO2 24 24  GLUCOSE 189* 99  BUN 28* 23  CREATININE 3.11* 2.28*  CALCIUM 9.0 8.1*   Liver Function Tests:  Recent Labs Lab 03/20/14 1522 03/21/14 0628  AST 20 17  ALT 13 9  ALKPHOS 77 62  BILITOT 0.7 0.5  PROT 6.7 5.7*  ALBUMIN 3.7 3.1*   CBC:  Recent Labs Lab 03/20/14 1522 03/21/14 0628  WBC 8.4 5.6  NEUTROABS 5.9  --   HGB 12.4 10.6*  HCT 37.0 31.6*  MCV 89.2 89.8  PLT 159 108*   CBG:  Recent Labs Lab 03/20/14 2217 03/21/14 0755  GLUCAP 178* 100*   Studies: Ct Head Wo Contrast  03/20/2014   CLINICAL DATA:  Weakness, lightheadedness. Recent CVA with residual left-sided weakness.  EXAM:  CT HEAD WITHOUT CONTRAST  TECHNIQUE: Contiguous axial images were obtained from the base of the skull through the vertex without intravenous contrast.  COMPARISON:  03/09/2014.  FINDINGS: No evidence of an acute infarct, acute hemorrhage, mass lesion, mass effect or hydrocephalus. Slight improvement in low-attenuation in the posterior right frontal lobe. No mass effect.  Mild atrophy. Remote lacunar infarct or dilated perivascular space in the right basal ganglia. Visualized portions of the paranasal sinuses and mastoid air cells are clear.  IMPRESSION: 1. No acute findings. 2. Expected evolutionary changes of a now subacute right middle cerebral artery infarct.   Electronically Signed   By: Lorin Picket  M.D.   On: 03/20/2014 16:38    Scheduled Meds: . atorvastatin  80 mg Oral q1800  . carvedilol  12.5 mg Oral BID WC  . clopidogrel  75 mg Oral Q breakfast  . insulin aspart  0-15 Units Subcutaneous TID WC  . insulin aspart  0-5 Units Subcutaneous QHS  . insulin detemir  25 Units Subcutaneous QHS  . isosorbide mononitrate  30 mg Oral Daily  . pantoprazole  40 mg Oral Daily  . potassium chloride  40 mEq Oral Q3H  . zolpidem  5 mg Oral QHS   Continuous Infusions: . sodium chloride 125 mL/hr at 03/21/14 6811    Active Problems:   CAD (coronary artery disease)   DM (diabetes mellitus)   HTN (hypertension)   Cirrhosis of liver without mention of alcohol   Acute renal failure   ARF (acute renal failure)   Time spent: 35  This note has been created with Surveyor, quantity. Any transcriptional errors are unintentional.   Marzetta Board, MD [facility] Pager [phone]. If 7 PM - 7 AM, please contact night-coverage at [email or web address], password [facility] 03/21/2014, 9:17 AM  LOS: 1 day

## 2014-03-21 NOTE — Clinical Documentation Improvement (Signed)
Possible Clinical Conditions?    _____Hypokalemia _____Other Condition _____Cannot Clinically Determine     Supporting Information:  Diagnostics: 7/14:  Potassium: 2.9 7/13:  Potassium: 3.2 Treatment: 7/14:  Potassium chloride 62mq.   Thank You, WTheron Arista Clinical Documentation Specialist:  [phone] CMcNairyInformation Management

## 2014-03-21 NOTE — Care Management Note (Unsigned)
    Page 1 of 1   03/21/2014     12:55:13 PM CARE MANAGEMENT NOTE 03/21/2014  Patient:  [name], [name]   Account Number:  [id number]  Date Initiated:  03/21/2014  Documentation initiated by:  Vladimir Creeks  Subjective/Objective Assessment:   admitted with ARF, on IVF. Pt is from home, and plans to return home at D/C. She has a son who is very attentive and assists with her care. She has [facility] active in the home, and would like to continue with them.     Action/Plan:   will follow for other needs   Anticipated DC Date:  03/23/2014   Anticipated DC Plan:  [place]  CM consult      [facility] Choice  HOME HEALTH  Resumption Of Svcs/PTA Provider   Choice offered to / List presented to:  [name]        [place] arranged  HH-1 RN  [place].   Status of service:   Medicare Important Message given?   (If response is "NO", the following Medicare IM given date fields will be blank) Date Medicare IM given:   Medicare IM given by:   Date Additional Medicare IM given:   Additional Medicare IM given by:    Discharge Disposition:    Per UR Regulation:  Reviewed for med. necessity/level of care/duration of stay  If discussed at [place] of Stay Meetings, dates discussed:    Comments:  03/21/14 [place] RN/CM

## 2014-03-22 ENCOUNTER — Inpatient Hospital Stay (HOSPITAL_COMMUNITY): Payer: Medicare Other

## 2014-03-22 DIAGNOSIS — K921 Melena: Secondary | ICD-10-CM

## 2014-03-22 LAB — CBC
HEMATOCRIT: 31.4 % — AB (ref 36.0–46.0)
Hemoglobin: 10.4 g/dL — ABNORMAL LOW (ref 12.0–15.0)
MCH: 29.9 pg (ref 26.0–34.0)
MCHC: 33.1 g/dL (ref 30.0–36.0)
MCV: 90.2 fL (ref 78.0–100.0)
PLATELETS: 95 10*3/uL — AB (ref 150–400)
RBC: 3.48 MIL/uL — ABNORMAL LOW (ref 3.87–5.11)
RDW: 13.6 % (ref 11.5–15.5)
WBC: 3.7 10*3/uL — ABNORMAL LOW (ref 4.0–10.5)

## 2014-03-22 LAB — GLUCOSE, CAPILLARY
GLUCOSE-CAPILLARY: 111 mg/dL — AB (ref 70–99)
Glucose-Capillary: 158 mg/dL — ABNORMAL HIGH (ref 70–99)
Glucose-Capillary: 177 mg/dL — ABNORMAL HIGH (ref 70–99)
Glucose-Capillary: 149 mg/dL — ABNORMAL HIGH (ref 70–99)

## 2014-03-22 LAB — COMPREHENSIVE METABOLIC PANEL
ALBUMIN: 3.1 g/dL — AB (ref 3.5–5.2)
ALT: 10 U/L (ref 0–35)
ANION GAP: 9 (ref 5–15)
AST: 16 U/L (ref 0–37)
Alkaline Phosphatase: 63 U/L (ref 39–117)
BUN: 15 mg/dL (ref 6–23)
CALCIUM: 8 mg/dL — AB (ref 8.4–10.5)
CO2: 26 mEq/L (ref 19–32)
Chloride: 113 mEq/L — ABNORMAL HIGH (ref 96–112)
Creatinine, Ser: 1.69 mg/dL — ABNORMAL HIGH (ref 0.50–1.10)
GFR calc Af Amer: 34 mL/min — ABNORMAL LOW (ref >90)
GFR calc non Af Amer: 29 mL/min — ABNORMAL LOW (ref >90)
Glucose, Bld: 109 mg/dL — ABNORMAL HIGH (ref 70–99)
Potassium: 3.6 mEq/L — ABNORMAL LOW (ref 3.7–5.3)
Sodium: 148 mEq/L — ABNORMAL HIGH (ref 137–147)
TOTAL PROTEIN: 5.7 g/dL — AB (ref 6.0–8.3)
Total Bilirubin: 0.4 mg/dL (ref 0.3–1.2)

## 2014-03-22 NOTE — Discharge Instructions (Addendum)
You were cared for by a hospitalist during your hospital stay. If you have any questions about your discharge medications or the care you received while you were in the hospital after you are discharged, you can call the unit and asked to speak with the hospitalist on call if the hospitalist that took care of you is not available. Once you are discharged, your primary care physician will handle any further medical issues. Please note that NO REFILLS for any discharge medications will be authorized once you are discharged, as it is imperative that you return to your primary care physician (or establish a relationship with a primary care physician if you do not have one) for your aftercare needs so that they can reassess your need for medications and monitor your lab values.  Follow with Primary MD Leonides Grills, MD in 5 days   Because of your kidney function I am stopping your blood pressure medication Diovan. Please follow up with your primary care MD in ~1 week to check your blood pressure and to check your renal function.  Please monitor your bowel movements every day and if you see blood or dark tarry stools or have any other signs of bleeding in your stomach please stop your Aspirin and Plavix and call Dr. Ewell Poe office or present to the ED.      Please request your Prim.MD to go over all Hospital Tests and Procedure/Radiological results at the follow up, please get all Hospital records sent to your Prim MD by signing hospital release before you go home.  Activity: As tolerated with Full fall precautions use walker/cane & assistance as needed  Diet: heart healthy  For Heart failure patients - Check your Weight same time everyday, if you gain over 2 pounds, or you develop in leg swelling, experience more shortness of breath or chest pain, call your Primary MD immediately. Follow Cardiac Low Salt Diet and 1.8 lit/day fluid restriction.  Disposition Home  If you experience worsening of  your admission symptoms, develop shortness of breath, life threatening emergency, suicidal or homicidal thoughts you must seek medical attention immediately by calling 911 or calling your MD immediately  if symptoms less severe.  You Must read complete instructions/literature along with all the possible adverse reactions/side effects for all the Medicines you take and that have been prescribed to you. Take any new Medicines after you have completely understood and accpet all the possible adverse reactions/side effects.   Do not drive and provide baby sitting services if your were admitted for syncope or siezures until you have seen by Primary MD or a Neurologist and advised to do so again.  Do not drive when taking Pain medications.   Do not take more than prescribed Pain, Sleep and Anxiety Medications  Special Instructions: If you have smoked or chewed Tobacco  in the last 2 yrs please stop smoking, stop any regular Alcohol  and or any Recreational drug use.  Wear Seat belts while driving.

## 2014-03-22 NOTE — Consult Note (Signed)
Reason for Consult:rectal bleeding Referring Physician: Hospitalist services  [name] is an 73 y.o. female.  HPI: Admitted thru the ED Monday. She had weakness and low blood pressure. Found to be in acute renal failure. She tells me she had not been eating and felt she was dehydrated.  She tells me since her stroke, she just did not have an appetite. She has a metal taste in her mouth. She says she has lost about 20 pounds since the middle of June.  She tells me she was constipated and used a glycerin supp. She strained and saw a small amount bright red blood. After admission on Monday she had a BM. She also had some diarrhea yesterday. She has not seen any further rectal bleeding. Recent CVA in June. She has facial drooping this am. Her speech clear.  She denies having any black stools. She takes Plavix daily and Baby ASA daily.  She rarely takes Ibuprofen.  Hx of NAFLD. Last esophageal banding was in September of 2014:  Impression:  Two columns of esophageal varices were banded. One column was banded at two levels. These were grade III.  Small sliding hiatal hernia.  Portal gastropathy and gastric antral vascular ectasia without stigmata of bleed.      Past Medical History  Diagnosis Date  . Back pain   . GERD (gastroesophageal reflux disease)   . MI (myocardial infarction)   . Hypercholesteremia   . Diabetes mellitus   . Anemia   . Cirrhosis   . Aortic sclerosis 07/04/2011  . Aortic insufficiency 07/04/2011    most recent 2D Echo 02/24/2012  EF greater than 55%  . Carotid stenosis, bilateral 07/04/2011    most recent 05/2010 carotid doppler  . OSA (obstructive sleep apnea) 07/04/2011    sleep study 2009  AHI 9.91/hr and during REM sleep 35.58/hr  . Presence of stent in right coronary artery 07/04/2011  . HTN (hypertension) 07/04/2011  . Coronary artery disease   . Myocardial infarction 1995  . GERD (gastroesophageal reflux disease)   . Non-alcoholic fatty liver  disease   . UTI (lower urinary tract infection)     chronic/recurring/on bactrim    Past Surgical History  Procedure Laterality Date  . Back surgery  1985  . Tonsillectomy    . Appendectomy    . Dilation and curettage of uterus    . Colonoscopy    . Eye surgery  08    cataract surgery of the left eye  . Ercp    . Abdominal hysterectomy  1972  . Cardiac catheterization  1995.,2006,1997    stent to the proximal RCA after MI   . Myringotomy  2012    both ears  . Hardware removal Right 01/17/2013    Procedure: REMOVAL OF HARDWARE AND EXCISION ULNAR STYLOID RIGHT WRIST;  Surgeon: Tennis Must, MD;  Location: [place];  Service: Orthopedics;  Laterality: Right;  . Wrist surgery      rt wrist hardwear removal  . Esophagogastroduodenoscopy N/A 04/01/2013    Procedure: ESOPHAGOGASTRODUODENOSCOPY (EGD);  Surgeon: Rogene Houston, MD;  [facility];  Service: Endoscopy;  Laterality: N/A;  230-rescheduled to 8:30am Ann notified pt  . Esophageal banding N/A 04/01/2013    Procedure: ESOPHAGEAL BANDING;  Surgeon: Rogene Houston, MD;  [facility];  Service: Endoscopy;  Laterality: N/A;  . Esophagogastroduodenoscopy N/A 05/24/2013    Procedure: ESOPHAGOGASTRODUODENOSCOPY (EGD);  Surgeon: Rogene Houston, MD;  [facility];  Service: Endoscopy;  Laterality: N/A;  730  . Esophageal banding N/A 05/24/2013    Procedure: ESOPHAGEAL BANDING;  Surgeon: Rogene Houston, MD;  [facility];  Service: Endoscopy;  Laterality: N/A;    Family History  Problem Relation Age of Onset  . Diabetes Mother   . Heart failure Father     Social History:  reports that she quit smoking about 2 years ago. Her smoking use included Cigarettes. She smoked 0.25 packs per day. She has never used smokeless tobacco. She reports that she does not drink alcohol or use illicit drugs.  Allergies:  Allergies  Allergen Reactions  . Tape Rash    ADHESIVE TAPE     Medications: I have reviewed the patient's current medications.  Results for orders placed during the hospital encounter of 03/20/14 (from the past 48 hour(s))  CBC WITH DIFFERENTIAL     Status: None   Collection Time    03/20/14  3:22 PM      Result Value Ref Range   WBC 8.4  4.0 - 10.5 K/uL   RBC 4.15  3.87 - 5.11 MIL/uL   Hemoglobin 12.4  12.0 - 15.0 g/dL   HCT 37.0  36.0 - 46.0 %   MCV 89.2  78.0 - 100.0 fL   MCH 29.9  26.0 - 34.0 pg   MCHC 33.5  30.0 - 36.0 g/dL   RDW 13.5  11.5 - 15.5 %   Platelets 159  150 - 400 K/uL   Neutrophils Relative % 69  43 - 77 %   Neutro Abs 5.9  1.7 - 7.7 K/uL   Lymphocytes Relative 19  12 - 46 %   Lymphs Abs 1.6  0.7 - 4.0 K/uL   Monocytes Relative 9  3 - 12 %   Monocytes Absolute 0.7  0.1 - 1.0 K/uL   Eosinophils Relative 3  0 - 5 %   Eosinophils Absolute 0.2  0.0 - 0.7 K/uL   Basophils Relative 0  0 - 1 %   Basophils Absolute 0.0  0.0 - 0.1 K/uL  COMPREHENSIVE METABOLIC PANEL     Status: Abnormal   Collection Time    03/20/14  3:22 PM      Result Value Ref Range   Sodium 140  137 - 147 mEq/L   Potassium 3.2 (*) 3.7 - 5.3 mEq/L   Chloride 101  96 - 112 mEq/L   CO2 24  19 - 32 mEq/L   Glucose, Bld 189 (*) 70 - 99 mg/dL   BUN 28 (*) 6 - 23 mg/dL   Creatinine, Ser 3.11 (*) 0.50 - 1.10 mg/dL   Calcium 9.0  8.4 - 10.5 mg/dL   Total Protein 6.7  6.0 - 8.3 g/dL   Albumin 3.7  3.5 - 5.2 g/dL   AST 20  0 - 37 U/L   ALT 13  0 - 35 U/L   Alkaline Phosphatase 77  39 - 117 U/L   Total Bilirubin 0.7  0.3 - 1.2 mg/dL   GFR calc non Af Amer 14 (*) >90 mL/min   GFR calc Af Amer 16 (*) >90 mL/min   Comment: (NOTE)     The eGFR has been calculated using the CKD EPI equation.     This calculation has not been validated in all clinical situations.     eGFR's persistently <90 mL/min signify possible Chronic Kidney     Disease.   Anion gap 15  5 - 15  I-STAT  CG4 LACTIC ACID, ED     Status: Abnormal   Collection Time    03/20/14  4:47 PM       Result Value Ref Range   Lactic Acid, Venous 2.88 (*) 0.5 - 2.2 mmol/L  I-STAT TROPOININ, ED     Status: None   Collection Time    03/20/14  4:47 PM      Result Value Ref Range   Troponin i, poc 0.03  0.00 - 0.08 ng/mL   Comment 3            Comment: Due to the release kinetics of cTnI,     a negative result within the first hours     of the onset of symptoms does not rule out     myocardial infarction with certainty.     If myocardial infarction is still suspected,     repeat the test at appropriate intervals.  POC OCCULT BLOOD, ED     Status: Abnormal   Collection Time    03/20/14  5:24 PM      Result Value Ref Range   Fecal Occult Bld POSITIVE (*) NEGATIVE  URINALYSIS, ROUTINE W REFLEX MICROSCOPIC     Status: None   Collection Time    03/20/14  8:46 PM      Result Value Ref Range   Color, Urine YELLOW  YELLOW   APPearance CLEAR  CLEAR   Specific Gravity, Urine 1.015  1.005 - 1.030   pH 5.0  5.0 - 8.0   Glucose, UA NEGATIVE  NEGATIVE mg/dL   Hgb urine dipstick NEGATIVE  NEGATIVE   Bilirubin Urine NEGATIVE  NEGATIVE   Ketones, ur NEGATIVE  NEGATIVE mg/dL   Protein, ur NEGATIVE  NEGATIVE mg/dL   Urobilinogen, UA 0.2  0.0 - 1.0 mg/dL   Nitrite NEGATIVE  NEGATIVE   Leukocytes, UA NEGATIVE  NEGATIVE   Comment: MICROSCOPIC NOT DONE ON URINES WITH NEGATIVE PROTEIN, BLOOD, LEUKOCYTES, NITRITE, OR GLUCOSE <1000 mg/dL.  GLUCOSE, CAPILLARY     Status: Abnormal   Collection Time    03/20/14 10:17 PM      Result Value Ref Range   Glucose-Capillary 178 (*) 70 - 99 mg/dL   Comment 1 Documented in Chart     Comment 2 Notify RN    CBC     Status: Abnormal   Collection Time    03/21/14  6:28 AM      Result Value Ref Range   WBC 5.6  4.0 - 10.5 K/uL   RBC 3.52 (*) 3.87 - 5.11 MIL/uL   Hemoglobin 10.6 (*) 12.0 - 15.0 g/dL   HCT 31.6 (*) 36.0 - 46.0 %   MCV 89.8  78.0 - 100.0 fL   MCH 30.1  26.0 - 34.0 pg   MCHC 33.5  30.0 - 36.0 g/dL   RDW 13.7  11.5 - 15.5 %   Platelets 108 (*)  150 - 400 K/uL   Comment: SPECIMEN CHECKED FOR CLOTS     DELTA CHECK NOTED     RESULT REPEATED AND VERIFIED     PLATELET COUNT CONFIRMED BY SMEAR  COMPREHENSIVE METABOLIC PANEL     Status: Abnormal   Collection Time    03/21/14  6:28 AM      Result Value Ref Range   Sodium 146  137 - 147 mEq/L   Potassium 2.9 (*) 3.7 - 5.3 mEq/L   Comment: CRITICAL RESULT CALLED TO, READ BACK BY AND VERIFIED WITH:  THOMAS,C AT 7:20AM ON 03/21/14 BY FESTERMAN,C   Chloride 109  96 - 112 mEq/L   CO2 24  19 - 32 mEq/L   Glucose, Bld 99  70 - 99 mg/dL   BUN 23  6 - 23 mg/dL   Creatinine, Ser 2.28 (*) 0.50 - 1.10 mg/dL   Calcium 8.1 (*) 8.4 - 10.5 mg/dL   Total Protein 5.7 (*) 6.0 - 8.3 g/dL   Albumin 3.1 (*) 3.5 - 5.2 g/dL   AST 17  0 - 37 U/L   ALT 9  0 - 35 U/L   Alkaline Phosphatase 62  39 - 117 U/L   Total Bilirubin 0.5  0.3 - 1.2 mg/dL   GFR calc non Af Amer 20 (*) >90 mL/min   GFR calc Af Amer 24 (*) >90 mL/min   Comment: (NOTE)     The eGFR has been calculated using the CKD EPI equation.     This calculation has not been validated in all clinical situations.     eGFR's persistently <90 mL/min signify possible Chronic Kidney     Disease.   Anion gap 13  5 - 15  GLUCOSE, CAPILLARY     Status: Abnormal   Collection Time    03/21/14  7:55 AM      Result Value Ref Range   Glucose-Capillary 100 (*) 70 - 99 mg/dL   Comment 1 Notify RN    GLUCOSE, CAPILLARY     Status: Abnormal   Collection Time    03/21/14 12:23 PM      Result Value Ref Range   Glucose-Capillary 196 (*) 70 - 99 mg/dL   Comment 1 Notify RN    GLUCOSE, CAPILLARY     Status: Abnormal   Collection Time    03/21/14  4:34 PM      Result Value Ref Range   Glucose-Capillary 122 (*) 70 - 99 mg/dL  CBC     Status: Abnormal   Collection Time    03/21/14  5:57 PM      Result Value Ref Range   WBC 4.4  4.0 - 10.5 K/uL   RBC 3.54 (*) 3.87 - 5.11 MIL/uL   Hemoglobin 10.7 (*) 12.0 - 15.0 g/dL   HCT 31.8 (*) 36.0 - 46.0 %   MCV  89.8  78.0 - 100.0 fL   MCH 30.2  26.0 - 34.0 pg   MCHC 33.6  30.0 - 36.0 g/dL   RDW 13.5  11.5 - 15.5 %   Platelets 94 (*) 150 - 400 K/uL   Comment: SPECIMEN CHECKED FOR CLOTS     CONSISTENT WITH PREVIOUS RESULT  CBC     Status: Abnormal   Collection Time    03/22/14  6:01 AM      Result Value Ref Range   WBC 3.7 (*) 4.0 - 10.5 K/uL   RBC 3.48 (*) 3.87 - 5.11 MIL/uL   Hemoglobin 10.4 (*) 12.0 - 15.0 g/dL   HCT 31.4 (*) 36.0 - 46.0 %   MCV 90.2  78.0 - 100.0 fL   MCH 29.9  26.0 - 34.0 pg   MCHC 33.1  30.0 - 36.0 g/dL   RDW 13.6  11.5 - 15.5 %   Platelets 95 (*) 150 - 400 K/uL   Comment: SPECIMEN CHECKED FOR CLOTS     CONSISTENT WITH PREVIOUS RESULT  COMPREHENSIVE METABOLIC PANEL     Status: Abnormal   Collection Time    03/22/14  6:01 AM  Result Value Ref Range   Sodium 148 (*) 137 - 147 mEq/L   Potassium 3.6 (*) 3.7 - 5.3 mEq/L   Comment: DELTA CHECK NOTED   Chloride 113 (*) 96 - 112 mEq/L   CO2 26  19 - 32 mEq/L   Glucose, Bld 109 (*) 70 - 99 mg/dL   BUN 15  6 - 23 mg/dL   Creatinine, Ser 1.69 (*) 0.50 - 1.10 mg/dL   Calcium 8.0 (*) 8.4 - 10.5 mg/dL   Total Protein 5.7 (*) 6.0 - 8.3 g/dL   Albumin 3.1 (*) 3.5 - 5.2 g/dL   AST 16  0 - 37 U/L   ALT 10  0 - 35 U/L   Alkaline Phosphatase 63  39 - 117 U/L   Total Bilirubin 0.4  0.3 - 1.2 mg/dL   GFR calc non Af Amer 29 (*) >90 mL/min   GFR calc Af Amer 34 (*) >90 mL/min   Comment: (NOTE)     The eGFR has been calculated using the CKD EPI equation.     This calculation has not been validated in all clinical situations.     eGFR's persistently <90 mL/min signify possible Chronic Kidney     Disease.   Anion gap 9  5 - 15  GLUCOSE, CAPILLARY     Status: Abnormal   Collection Time    03/22/14  7:46 AM      Result Value Ref Range   Glucose-Capillary 111 (*) 70 - 99 mg/dL   Comment 1 Notify RN      Ct Head Wo Contrast  03/20/2014   CLINICAL DATA:  Weakness, lightheadedness. Recent CVA with residual left-sided  weakness.  EXAM: CT HEAD WITHOUT CONTRAST  TECHNIQUE: Contiguous axial images were obtained from the base of the skull through the vertex without intravenous contrast.  COMPARISON:  03/09/2014.  FINDINGS: No evidence of an acute infarct, acute hemorrhage, mass lesion, mass effect or hydrocephalus. Slight improvement in low-attenuation in the posterior right frontal lobe. No mass effect.  Mild atrophy. Remote lacunar infarct or dilated perivascular space in the right basal ganglia. Visualized portions of the paranasal sinuses and mastoid air cells are clear.  IMPRESSION: 1. No acute findings. 2. Expected evolutionary changes of a now subacute right middle cerebral artery infarct.   Electronically Signed   By: Lorin Picket M.D.   On: 03/20/2014 16:38    ROS Blood pressure 138/38, pulse 73, temperature 98.7 F (37.1 C), temperature source Oral, resp. rate 20, height _0  (1.6 m), weight 186 lb 15.2 oz (84.8 kg), SpO2 96.00%. Physical Exam Alert and oriented. Left facial droop. Speech is clear.  Skin warm and dry. Oral mucosa is moist.   . Sclera anicteric, conjunctivae is pink. Thyroid not enlarged. No cervical lymphadenopathy. Lungs clear. Heart regular rate and rhythm.  Abdomen is soft. Bowel sounds are positive. No hepatomegaly. No abdominal masses felt. No tenderness.  No edema to lower extremities.   Assessment/Plan: History of GI bleed. Recent episode of what appeared to be most likely secondary to hemorrhoids.  She has not experienced overt melena or frank rectal bleeding. Gram hemoglobin drop but appeared to be secondary to hydration. Will discuss with Dr. Laural Golden.  SETZER,TERRI W 03/22/2014, 8:15 AM   GI attending note; Patient interviewed and examined and discussed with  Dr.Gherghe. Patient is well known to me. She has cirrhosis secondary to NAFLD complicated by esophageal variceal bleed and she was banded twice last year as above. She has not  bled since then. Patient suffered CVA 4 weeks  ago and was begun on low-dose aspirin and Plavix. Patient was readmitted two days ago for dehydration and noted to have heme positive stool. She has not experienced melena or rectal bleeding during this hospitalization her hemoglobin has drawn by 2 g. This drop clearly what appeared to be secondary to hydration since the patient was dehydrated and in renal failure on admission. Patient is at risk for GI bleed; however she is at greater risk for extension of her stroke or recurrence and therefore will need antiplatelet agents. Patient understands the risks of taking antiplatelet agents versus not taking them. Hopefully aspirin can be dropped down the road. Should patient develop frank bleeding or melena she will need to undergo endoscopic evaluation and will need to come off these medications. Agree with plans for patient to go home today. Will check H&H next week.

## 2014-03-22 NOTE — Progress Notes (Signed)
Patient with orders to be discharge home. Discharge instructions given, patient verbalized understanding. Patient stable. Patient left with family in private vehicle.

## 2014-03-22 NOTE — Discharge Summary (Addendum)
Physician Discharge Summary  Nasrin Lanzo Pieczynski LAG:[id number] DOB: 04-11-1941 DOA: 03/20/2014  PCP: Leonides Grills, MD  Admit date: 03/20/2014 Discharge date: 03/22/2014  Time spent: 35 minutes  Recommendations for Outpatient Follow-up:  1. Follow up with PCP in 1 week 2. Follow up with GI in 1 week 3. Hold Diovan on discharge    Recommendations for primary care physician for things to follow:  Monitor blood pressure, recheck BMP and CBC  Discharge Diagnoses:  Active Problems:   CAD (coronary artery disease)   DM (diabetes mellitus)   HTN (hypertension)   Cirrhosis of liver without mention of alcohol   Acute renal failure   ARF (acute renal failure)  Discharge Condition: stable  Diet recommendation: regular   Filed Weights   03/20/14 1519 03/20/14 2109  Weight: 90.719 kg (200 lb) 84.8 kg (186 lb 15.2 oz)   History of present illness:  This is a 73 year old lady who had an episode of dizziness approximately 7 hours ago and she fell from a sitting position. She did not lose consciousness. She was then brought to the emergency room when she was found to have low blood pressure and was in acute renal failure. She is now being admitted for further management. She says that she has not had a good appetite since her stroke at the end of June and she has not had good by mouth intake including fluids.  Hospital Course:  Acute renal failure - multifactorial, due to poor po intake with dehydration as well as being on ARB/HCTZ. Diovan was discontinued on admission and patient's renal function has improved significantly with IV fluids. Advised to follow up with her PCP in a week for repeat blood work.  Positive FOBT - likely related to her hemorrhoids, patient without obvious clinical signs of bleeding, no reported melena to suggest upper GI bleeding. Mild nausea but no vomiting. Her hemoglobin initially had a small drop which was likely dilutional. Gastroenterology was consulted as well.  After discussing with Dr. Laural Golden and with the patient will resume her antiplatelet agents for secondary stroke prevention as she just recently had a CVA and there is no active ongoing bleed. Benefits and risks were discussed with the patient and she will closely monitor for signs of bleeding and knows to call GI's office or to come to the ED if she notices blood or melena.  Liver cirrhosis - appears compensated currently.  Thrombocytopenia - due to liver disease, stable platelets. Anemia - likely dilutional. Recent CVA - stable, no new focal findings  DM - continue current regimen. Last A1C 7.5 on 6/29  HTN - will need to come off of Diovan, blood pressure has been normal while here, will not start additional agents, will need further monitoring as an outpatient.   CAD - clinically stable Hypokalemia - improved with repletion, hold diuretics on d/c   Procedures:  None    Consultations:  GI  Discharge Exam: Filed Vitals:   03/21/14 1540 03/21/14 2013 03/22/14 0546 03/22/14 1501  BP: 108/34 122/36 138/38 122/43  Pulse: 63 73 73 65  Temp: 98.4 F (36.9 C) 98.3 F (36.8 C) 98.7 F (37.1 C) 98.3 F (36.8 C)  TempSrc: Oral Oral Oral Oral  Resp: 20 20 20 20   Height:      Weight:      SpO2: 95% 97% 96% 97%    General: NAD Cardiovascular: RRR Respiratory: CTA biL  Discharge Instructions     Medication List    STOP taking these  medications       valsartan-hydrochlorothiazide 160-12.5 MG per tablet  Commonly known as:  DIOVAN-HCT      TAKE these medications       aspirin 81 MG tablet  Take 81 mg by mouth daily.     carvedilol 12.5 MG tablet  Commonly known as:  COREG  Take 1 tablet (12.5 mg total) by mouth 2 (two) times daily.     clopidogrel 75 MG tablet  Commonly known as:  PLAVIX  Take 1 tablet (75 mg total) by mouth daily.     insulin NPH-regular Human (70-30) 100 UNIT/ML injection  Commonly known as:  NOVOLIN 70/30  Inject 72 Units into the skin 2 (two)  times daily with a meal. After lunch and after supper.     isosorbide mononitrate 30 MG 24 hr tablet  Commonly known as:  IMDUR  Take 1 tablet (30 mg total) by mouth daily.     LANTUS SOLOSTAR 100 UNIT/ML injection  Generic drug:  insulin glargine  Inject 72 Units into the skin at bedtime.     omeprazole 20 MG tablet  Commonly known as:  PRILOSEC OTC  Take 20 mg by mouth daily.     rosuvastatin 40 MG tablet  Commonly known as:  CRESTOR  Take 1 tablet (40 mg total) by mouth at bedtime.     zolpidem 5 MG tablet  Commonly known as:  AMBIEN  Take 5 mg by mouth at bedtime.           Follow-up Information   Follow up with Leonides Grills, MD. Schedule an appointment as soon as possible for a visit in 1 week. (check blood pressure and renal function)    Specialty:  Family Medicine   Contact information:   [address] [place] [address] [phone]       Follow up with REHMAN,NAJEEB U, MD. Schedule an appointment as soon as possible for a visit in 1 week. (repeat blood counts)    Specialty:  Gastroenterology   Contact information:   [place], [place] [address] [phone]       The results of significant diagnostics from this hospitalization (including imaging, microbiology, ancillary and laboratory) are listed below for reference.    Significant Diagnostic Studies: Dg Chest 2 View  03/06/2014   CLINICAL DATA:  Cerebrovascular accident.  EXAM: CHEST  2 VIEW  COMPARISON:  March 25, 2013.  FINDINGS: The heart size and mediastinal contours are within normal limits. Both lungs are clear. No pneumothorax or pleural effusion is noted. The visualized skeletal structures are unremarkable.  IMPRESSION: No acute cardiopulmonary abnormality seen.   Electronically Signed   By: Sabino Dick M.D.   On: 03/06/2014 16:19   Ct Head Wo Contrast  03/20/2014   CLINICAL DATA:  Weakness, lightheadedness. Recent CVA with residual left-sided weakness.  EXAM: CT HEAD  WITHOUT CONTRAST  TECHNIQUE: Contiguous axial images were obtained from the base of the skull through the vertex without intravenous contrast.  COMPARISON:  03/09/2014.  FINDINGS: No evidence of an acute infarct, acute hemorrhage, mass lesion, mass effect or hydrocephalus. Slight improvement in low-attenuation in the posterior right frontal lobe. No mass effect.  Mild atrophy. Remote lacunar infarct or dilated perivascular space in the right basal ganglia. Visualized portions of the paranasal sinuses and mastoid air cells are clear.  IMPRESSION: 1. No acute findings. 2. Expected evolutionary changes of a now subacute right middle cerebral artery infarct.   Electronically Signed  By: Lorin Picket M.D.   On: 03/20/2014 16:38   Ct Head Wo Contrast  03/09/2014   CLINICAL DATA:  Stroke  EXAM: CT HEAD WITHOUT CONTRAST  TECHNIQUE: Contiguous axial images were obtained from the base of the skull through the vertex without intravenous contrast.  COMPARISON:  CT 03/06/2014  FINDINGS: Right frontal operculum infarct shows progressive low-density but is similar in size to the prior CT and MRI. Hyperdense vessel in the posterior right sylvian fissure appears to have migrated since the prior study. MRI may be helpful to determine if this infarct has extended. There is no associated hemorrhage. No shift of the midline structures  Ventricle size is normal.  IMPRESSION: Acute infarct in the right frontal operculum similar in size to the prior study. No hemorrhage. New area of hyperdense thrombus in the posterior right MCA branch which may be due to thrombus migration. MRI may be helpful to determine if there has been infarct extension.   Electronically Signed   By: Franchot Gallo M.D.   On: 03/09/2014 14:39   Ct Head Wo Contrast  03/06/2014   ADDENDUM REPORT: 03/06/2014 11:53  ADDENDUM: The small amount of increased density in the sylvian fissure is consistent with intraluminal thrombus in light of the subsequent MRI  findings. It is not now felt to reflect acute blood.   Electronically Signed   By: David  Martinique   On: 03/06/2014 11:53   03/06/2014   CLINICAL DATA:  Status post multiple falls; left-sided facial droop  EXAM: CT HEAD WITHOUT CONTRAST  TECHNIQUE: Contiguous axial images were obtained from the base of the skull through the vertex without intravenous contrast.  COMPARISON:  Noncontrast CT scan of the brain of February 24, 2012.  FINDINGS: There is new subtle decreased density within the right frontal lobe posteriorly. There is a tiny amount of increased density within the sylvian fissure on the right which is new and may reflect small amounts of blood. There is stable bifrontal cerebral atrophy. There is no shift of the midline. Old lacunar infarctions in the basal ganglia are present bilaterally. The cerebellum and brainstem are normal in density.  At bone window settings the observed portions of the paranasal sinuses and mastoid air cells are clear. There is no acute skull fracture.  IMPRESSION: 1. The findings are consistent with early ischemic change in the posterior aspect of the right frontal lobe. A tiny amount of acute blood within or adjacent to the sylvian fissure on the right is present. 2. There is no CT evidence of significantly increased intracranial pressure. 3. No acute skull fracture is demonstrated. 4. Critical Value/emergent results were called by telephone at the time of interpretation on 03/06/2014 at 10:11 AM to Dr. Fredia Sorrow , who verbally acknowledged these results.  Electronically Signed: By: David  Martinique On: 03/06/2014 10:12   Mr Jodene Nam Head Wo Contrast  03/06/2014   CLINICAL DATA:  Acute right frontal infarction  EXAM: MRA HEAD WITHOUT CONTRAST  TECHNIQUE: Angiographic images of the Circle of Willis were obtained using MRA technique without intravenous contrast.  COMPARISON:  MRI same day  FINDINGS: Both internal carotid arteries are widely patent into the brain. The anterior and middle  cerebral vessels are patent bilaterally. This includes flow within the region of the acute infarction. Intracranial branch vessels do show some narrowing and irregularity.  Both vertebral arteries are widely patent to the basilar. No basilar stenosis. Posterior circulation branch vessels are patent, also showing some distal vessel atherosclerotic irregularity.  IMPRESSION: No major vessel occlusion or correctable proximal stenosis. This includes demonstrable flow within the region of the right frontal cortical and subcortical infarction.  Distal intracranial branch vessels do show atherosclerotic irregularity.   Electronically Signed   By: Nelson Chimes M.D.   On: 03/06/2014 20:51   Mr Brain Wo Contrast  03/06/2014   CLINICAL DATA:  Left facial droop.  Aphasia  EXAM: MRI HEAD WITHOUT CONTRAST  TECHNIQUE: Multiplanar, multiecho pulse sequences of the brain and surrounding structures were obtained without intravenous contrast.  COMPARISON:  CT head 03/06/2014  FINDINGS: Acute infarct in the right frontal operculum with some extension into the posterior insula. No other areas of acute infarct are identified.  Chronic microvascular ischemic changes in the white matter. Chronic infarcts in the left thalamus and pons bilaterally.  Negative for hemorrhage. Hyperdensity in the right sylvian fissure on the CT felt to be clot in the right MCA. There is slow flow or occlusion in the right parietal branch of the MCA on FLAIR imaging.  Ventricle size is normal. No shift of the midline structures. Negative for mass lesion.  Mastoid sinus effusion bilaterally.  IMPRESSION: Acute infarct in the right frontal operculum and right insular cortex with decreased flow or occlusion of right middle cerebral artery branch supplying this area. Negative for hemorrhagic transformation.   Electronically Signed   By: Franchot Gallo M.D.   On: 03/06/2014 11:54   US Carotid Bilateral  03/06/2014   CLINICAL DATA:  Acute right hemispheric  cerebral infarction. History of coronary artery disease, myocardial infarction, hyperlipidemia, diabetes and smoking.  EXAM: BILATERAL CAROTID DUPLEX ULTRASOUND  TECHNIQUE: Pearline Cables scale imaging, color Doppler and duplex ultrasound were performed of bilateral carotid and vertebral arteries in the neck.  COMPARISON:  05/14/2010  FINDINGS: Criteria: Quantification of carotid stenosis is based on velocity parameters that correlate the residual internal carotid diameter with NASCET-based stenosis levels, using the diameter of the distal internal carotid lumen as the denominator for stenosis measurement.  The following velocity measurements were obtained:  RIGHT  ICA:  132/29 cm/sec  CCA:  810/17 cm/sec  SYSTOLIC ICA/CCA RATIO:  1.3  DIASTOLIC ICA/CCA RATIO:  2.3  ECA:  183 cm/sec  LEFT  ICA:  197/22 cm/sec  CCA:  510/25 cm/sec  SYSTOLIC ICA/CCA RATIO:  1.6  DIASTOLIC ICA/CCA RATIO:  1.6  ECA:  301 cm/sec  RIGHT CAROTID ARTERY: A mild amount of predominately calcified plaque is present at the level of the distal bulb and proximal ICA. Proximal velocities and waveforms are unremarkable and estimated ICA stenosis is less than 50%. Mildly elevated velocities in the higher internal carotid segment within the upper neck noted at a level of mild tortuosity without focal plaque.  RIGHT VERTEBRAL ARTERY: Antegrade flow with normal waveform and velocity.  LEFT CAROTID ARTERY: Stable moderate plaque burden at the level of the distal bulb and extending up to the ICA origin. This causes luminal stenosis with turbulent flow. Estimated proximal left ICA stenosis is 50- 69%.  LEFT VERTEBRAL ARTERY: Antegrade flow with normal waveform and velocity.  IMPRESSION: Bilateral atherosclerotic plaque at the level of the distal carotid bulbs and proximal internal carotid arteries. Estimated right ICA stenosis is less than 50%. Estimated left ICA stenosis is 50- 69%.   Electronically Signed   By: Aletta Edouard M.D.   On: 03/06/2014 16:42   US  Abdomen Limited Ruq  03/22/2014   CLINICAL DATA:  Hepatic cirrhosis  EXAM: US ABDOMEN LIMITED - RIGHT UPPER QUADRANT  COMPARISON:  Prior right upper quadrant ultrasound 04/04/2013  FINDINGS: Gallbladder:  Nonmobile echogenic focus at the gallbladder fundus measures approximately 6 mm. This could not clearly be identified on the prior study. No evidence of shadowing. No definite cholelithiasis. The gallbladder wall is Within normal limits at 3 mm. Per the sonographer, sonographic Percell Miller sign was negative.  Common bile duct:  Diameter: 4.5 mm  Liver:  Nodular hepatic contour consistent with cirrhosis. The hepatic parenchyma is diffusely heterogeneous and echogenic. No discrete solid lesion identified. Main portal vein is patent with hepatopetal flow.  IMPRESSION: 1. Cirrhotic liver morphology. 2. Negative for ascites. 3. Main portal vein remains patent with hepatopetal flow. 4. Nonspecific 6 mm echogenic focus at the gallbladder fundus which was not clearly seen on the prior ultrasound from 04/04/2013. Differential considerations include infolding of the gallbladder wall, developing adenomyomatosis, and possibly a small developing gallbladder polyp. Recommend attention on the next routine follow-up ultrasound in 1 year.   Electronically Signed   By: Jacqulynn Cadet M.D.   On: 03/22/2014 13:25   Labs: Basic Metabolic Panel:  Recent Labs Lab 03/20/14 1522 03/21/14 0628 03/22/14 0601  NA 140 146 148*  K 3.2* 2.9* 3.6*  CL 101 109 113*  CO2 24 24 26   GLUCOSE 189* 99 109*  BUN 28* 23 15  CREATININE 3.11* 2.28* 1.69*  CALCIUM 9.0 8.1* 8.0*   Liver Function Tests:  Recent Labs Lab 03/20/14 1522 03/21/14 0628 03/22/14 0601  AST 20 17 16   ALT 13 9 10   ALKPHOS 77 62 63  BILITOT 0.7 0.5 0.4  PROT 6.7 5.7* 5.7*  ALBUMIN 3.7 3.1* 3.1*   CBC:  Recent Labs Lab 03/20/14 1522 03/21/14 0628 03/21/14 1757 03/22/14 0601  WBC 8.4 5.6 4.4 3.7*  NEUTROABS 5.9  --   --   --   HGB 12.4 10.6* 10.7*  10.4*  HCT 37.0 31.6* 31.8* 31.4*  MCV 89.2 89.8 89.8 90.2  PLT 159 108* 94* 95*   CBG:  Recent Labs Lab 03/21/14 1634 03/21/14 2042 03/22/14 0746 03/22/14 1214 03/22/14 1653  GLUCAP 122* 158* 111* 177* 149*   Signed:  Rithy Mandley  [facility] 03/22/2014, 5:11 PM

## 2014-04-10 ENCOUNTER — Telehealth (INDEPENDENT_AMBULATORY_CARE_PROVIDER_SITE_OTHER): Payer: Self-pay | Admitting: *Deleted

## 2014-04-10 NOTE — Telephone Encounter (Signed)
Per Dr.Rehman - patient may be brought in sometime this month. Karna Christmas is fine but on a day that he is in the office.

## 2014-04-10 NOTE — Telephone Encounter (Signed)
[name] would like to know when is her next apt due?

## 2014-04-10 NOTE — Telephone Encounter (Signed)
Apt has been scheduled for 05/01/14 at 3:30 pm with Deberah Castle, NP.

## 2014-05-01 ENCOUNTER — Ambulatory Visit (INDEPENDENT_AMBULATORY_CARE_PROVIDER_SITE_OTHER): Payer: Medicare Other | Admitting: Internal Medicine

## 2014-05-02 ENCOUNTER — Ambulatory Visit (INDEPENDENT_AMBULATORY_CARE_PROVIDER_SITE_OTHER): Payer: Medicare Other | Admitting: Internal Medicine

## 2014-05-02 ENCOUNTER — Encounter (INDEPENDENT_AMBULATORY_CARE_PROVIDER_SITE_OTHER): Payer: Self-pay | Admitting: Internal Medicine

## 2014-05-02 ENCOUNTER — Other Ambulatory Visit (INDEPENDENT_AMBULATORY_CARE_PROVIDER_SITE_OTHER): Payer: Self-pay | Admitting: *Deleted

## 2014-05-02 ENCOUNTER — Encounter (INDEPENDENT_AMBULATORY_CARE_PROVIDER_SITE_OTHER): Payer: Self-pay | Admitting: *Deleted

## 2014-05-02 VITALS — BP 90/40 | HR 64 | Temp 97.9°F | Ht 63.0 in | Wt 193.1 lb

## 2014-05-02 DIAGNOSIS — K760 Fatty (change of) liver, not elsewhere classified: Secondary | ICD-10-CM

## 2014-05-02 DIAGNOSIS — I8500 Esophageal varices without bleeding: Secondary | ICD-10-CM

## 2014-05-02 DIAGNOSIS — K7689 Other specified diseases of liver: Secondary | ICD-10-CM

## 2014-05-02 LAB — CBC WITH DIFFERENTIAL/PLATELET
Basophils Absolute: 0 10*3/uL (ref 0.0–0.1)
Basophils Relative: 0 % (ref 0–1)
EOS PCT: 3 % (ref 0–5)
Eosinophils Absolute: 0.3 10*3/uL (ref 0.0–0.7)
HCT: 36.4 % (ref 36.0–46.0)
HEMOGLOBIN: 11.8 g/dL — AB (ref 12.0–15.0)
LYMPHS ABS: 1.7 10*3/uL (ref 0.7–4.0)
Lymphocytes Relative: 19 % (ref 12–46)
MCH: 28.8 pg (ref 26.0–34.0)
MCHC: 32.4 g/dL (ref 30.0–36.0)
MCV: 88.8 fL (ref 78.0–100.0)
MONOS PCT: 8 % (ref 3–12)
Monocytes Absolute: 0.7 10*3/uL (ref 0.1–1.0)
Neutro Abs: 6.3 10*3/uL (ref 1.7–7.7)
Neutrophils Relative %: 70 % (ref 43–77)
PLATELETS: 162 10*3/uL (ref 150–400)
RBC: 4.1 MIL/uL (ref 3.87–5.11)
RDW: 14.4 % (ref 11.5–15.5)
WBC: 9 10*3/uL (ref 4.0–10.5)

## 2014-05-02 NOTE — Progress Notes (Signed)
Subjective:     Patient ID: [name], female   DOB: [birth year], 73 y.o.   MRN: [id number]  HPI Here today for f/u.  Recently admitted to [facility] in July with weakness and low blood pressure. Found to be acute renal failure. She also had some BRRB prior to admission. She had strained to have a BM. She tells me she was constipated.  Recent CVA in June.  Hx of cirrhosis secondary to NAFLD and history of esophageal varices.Last Cmet in July her liver enzymes were normal.  Her last banding was in 2014 by Dr. Laural Golden. No recent hx of black stools.   Appetite is not good. She says she has a bitter taste in her mouth since having the CVA in July. She has been off the Omeprazole x 2 weeks. She has not lost any weight however. In July her weight was 186. Today her weight is 193.1.  She usually has a BM about every 2-3 days. Stools are brown in color and hard as little rocks.  CMP     Component Value Date/Time   NA 148* 03/22/2014 0601   K 3.6* 03/22/2014 0601   CL 113* 03/22/2014 0601   CO2 26 03/22/2014 0601   GLUCOSE 109* 03/22/2014 0601   BUN 15 03/22/2014 0601   CREATININE 1.69* 03/22/2014 0601   CREATININE 1.12* 12/15/2013 1037   CALCIUM 8.0* 03/22/2014 0601   PROT 5.7* 03/22/2014 0601   ALBUMIN 3.1* 03/22/2014 0601   AST 16 03/22/2014 0601   ALT 10 03/22/2014 0601   ALKPHOS 63 03/22/2014 0601   BILITOT 0.4 03/22/2014 0601   GFRNONAA 29* 03/22/2014 0601   GFRNONAA 50* 12/15/2013 1037   GFRAA 34* 03/22/2014 0601   GFRAA 57* 12/15/2013 1037     CBC    Component Value Date/Time   WBC 3.7* 03/22/2014 0601   RBC 3.48* 03/22/2014 0601   HGB 10.4* 03/22/2014 0601   HCT 31.4* 03/22/2014 0601   PLT 95* 03/22/2014 0601   MCV 90.2 03/22/2014 0601   MCH 29.9 03/22/2014 0601   MCHC 33.1 03/22/2014 0601   RDW 13.6 03/22/2014 0601   LYMPHSABS 1.6 03/20/2014 1522   MONOABS 0.7 03/20/2014 1522   EOSABS 0.2 03/20/2014 1522   BASOSABS 0.0 03/20/2014 1522     03/22/2014 [place] RUQ:  IMPRESSION:  1. Cirrhotic liver morphology.   2. Negative for ascites.  3. Main portal vein remains patent with hepatopetal flow.  4. Nonspecific 6 mm echogenic focus at the gallbladder fundus which  was not clearly seen on the prior ultrasound from 04/04/2013.  Differential considerations include infolding of the gallbladder  wall, developing adenomyomatosis, and possibly a small developing  gallbladder polyp. Recommend attention on the next routine follow-up  ultrasound in 1 year.  05/24/2014 EGD/Banding:  Complications: None  Impression:  Two columns of esophageal varices were banded. One column was banded at two levels. These were grade III.  Small sliding hiatal hernia.  Portal gastropathy and gastric antral vascular ectasia without stigmata of bleed.  Recommendations:   Review of Systems Past Medical History  Diagnosis Date  . Back pain   . GERD (gastroesophageal reflux disease)   . MI (myocardial infarction)   . Hypercholesteremia   . Diabetes mellitus     x 20 yrs  . Anemia   . Cirrhosis   . Aortic sclerosis 07/04/2011  . Aortic insufficiency 07/04/2011    most recent 2D Echo 02/24/2012  EF greater than 55%  . Carotid stenosis, bilateral  07/04/2011    most recent 05/2010 carotid doppler  . OSA (obstructive sleep apnea) 07/04/2011    sleep study 2009  AHI 9.91/hr and during REM sleep 35.58/hr  . Presence of stent in right coronary artery 07/04/2011  . HTN (hypertension) 07/04/2011  . Coronary artery disease   . Myocardial infarction 1995  . GERD (gastroesophageal reflux disease)   . Non-alcoholic fatty liver disease   . UTI (lower urinary tract infection)     chronic/recurring/on bactrim  . CVA (cerebral infarction)     03/07/2014    Past Surgical History  Procedure Laterality Date  . Back surgery  1985  . Tonsillectomy    . Appendectomy    . Dilation and curettage of uterus    . Colonoscopy    . Eye surgery  08    cataract surgery of the left eye  . Ercp    . Abdominal hysterectomy  1972  .  Cardiac catheterization  1995.,2006,1997    stent to the proximal RCA after MI   . Myringotomy  2012    both ears  . Hardware removal Right 01/17/2013    Procedure: REMOVAL OF HARDWARE AND EXCISION ULNAR STYLOID RIGHT WRIST;  Surgeon: Tennis Must, MD;  Location: [facility];  Service: Orthopedics;  Laterality: Right;  . Wrist surgery      rt wrist hardwear removal  . Esophagogastroduodenoscopy N/A 04/01/2013    Procedure: ESOPHAGOGASTRODUODENOSCOPY (EGD);  Surgeon: Rogene Houston, MD;  Location: [facility];  Service: Endoscopy;  Laterality: N/A;  230-rescheduled to 8:30am Ann notified pt  . Esophageal banding N/A 04/01/2013    Procedure: ESOPHAGEAL BANDING;  Surgeon: Rogene Houston, MD;  Location: [facility];  Service: Endoscopy;  Laterality: N/A;  . Esophagogastroduodenoscopy N/A 05/24/2013    Procedure: ESOPHAGOGASTRODUODENOSCOPY (EGD);  Surgeon: Rogene Houston, MD;  Location: [facility];  Service: Endoscopy;  Laterality: N/A;  730  . Esophageal banding N/A 05/24/2013    Procedure: ESOPHAGEAL BANDING;  Surgeon: Rogene Houston, MD;  Location: [facility];  Service: Endoscopy;  Laterality: N/A;    Allergies  Allergen Reactions  . Tape Rash    ADHESIVE TAPE    Current Outpatient Prescriptions on File Prior to Visit  Medication Sig Dispense Refill  . aspirin 81 MG tablet Take 81 mg by mouth daily.      . carvedilol (COREG) 12.5 MG tablet Take 1 tablet (12.5 mg total) by mouth 2 (two) times daily.  60 tablet  6  . clopidogrel (PLAVIX) 75 MG tablet Take 1 tablet (75 mg total) by mouth daily.  60 tablet  0  . insulin glargine (LANTUS SOLOSTAR) 100 UNIT/ML injection Inject 72 Units into the skin at bedtime.       . insulin NPH-insulin regular (NOVOLIN 70/30) (70-30) 100 UNIT/ML injection Inject 70 Units into the skin 2 (two) times daily with a meal. After lunch and after supper.      . isosorbide mononitrate (IMDUR) 30 MG 24 hr tablet Take 1 tablet (30 mg total) by  mouth daily.  30 tablet  9  . rosuvastatin (CRESTOR) 40 MG tablet Take 1 tablet (40 mg total) by mouth at bedtime.  30 tablet  9  . omeprazole (PRILOSEC OTC) 20 MG tablet Take 20 mg by mouth daily.       No current facility-administered medications on file prior to visit.        Objective:   Physical Exam  Filed Vitals:  05/02/14 1553  BP: 90/40  Pulse: 64  Temp: 97.9 F (36.6 C)  Height: 5' 3"  (1.6 m)  Weight: 193 lb 1.6 oz (87.59 kg)   Alert and oriented. Skin warm and dry. Oral mucosa is moist.   . Sclera anicteric, conjunctivae is pink. Thyroid not enlarged. No cervical lymphadenopathy. Lungs clear. Heart regular rate and rhythm.  Abdomen is soft. Bowel sounds are positive. No hepatomegaly. No abdominal masses felt. No tenderness.  No edema to lower extremities.       Assessment:     NAFLD. Hx of esophageal varices. Last banding in September of 2014 Hx of melena :       Abnormal [place] RUQ in July of this year:  4. Nonspecific 6 mm echogenic focus at the gallbladder fundus which  was not clearly seen on the prior ultrasound from 04/04/2013.   Plan:     Surveillance [place] RUQ in January of 2016 for f/u. (see above).  EGD/Banding for esophageal varices. CBC

## 2014-05-02 NOTE — Patient Instructions (Signed)
EGD/Banding. The risks and benefits such as perforation, bleeding, and infection were reviewed with the patient and is agreeable.

## 2014-05-03 ENCOUNTER — Other Ambulatory Visit: Payer: Self-pay | Admitting: Neurology

## 2014-05-03 DIAGNOSIS — R471 Dysarthria and anarthria: Secondary | ICD-10-CM

## 2014-05-04 ENCOUNTER — Telehealth (INDEPENDENT_AMBULATORY_CARE_PROVIDER_SITE_OTHER): Payer: Self-pay | Admitting: *Deleted

## 2014-05-04 NOTE — Telephone Encounter (Signed)
EGD has been rescheduled to 10/14, patient is aware

## 2014-05-04 NOTE — Telephone Encounter (Signed)
rec'd message from [facility] w/ Dr Merlene Laughter about patient holding Plavix 5 days prior to EGD -- per Dr Freddie Apley recommendation he would prefer for patient to wait 3 months post stroke to stop Plavix which would be end of September at which time she could stop Plavix -- if EGD is an emergency ok to proceed -- please advise

## 2014-05-04 NOTE — Telephone Encounter (Signed)
End of September will be okay.

## 2014-05-05 ENCOUNTER — Emergency Department (HOSPITAL_COMMUNITY)
Admission: EM | Admit: 2014-05-05 | Discharge: 2014-05-05 | Disposition: A | Discharge status: Home / Self Care | Payer: Medicare Other | Attending: Emergency Medicine | Admitting: Emergency Medicine

## 2014-05-05 ENCOUNTER — Encounter (HOSPITAL_COMMUNITY): Payer: Self-pay | Admitting: Emergency Medicine

## 2014-05-05 DIAGNOSIS — Z9889 Other specified postprocedural states: Secondary | ICD-10-CM | POA: Insufficient documentation

## 2014-05-05 DIAGNOSIS — E119 Type 2 diabetes mellitus without complications: Secondary | ICD-10-CM | POA: Insufficient documentation

## 2014-05-05 DIAGNOSIS — R112 Nausea with vomiting, unspecified: Secondary | ICD-10-CM | POA: Insufficient documentation

## 2014-05-05 DIAGNOSIS — Z87891 Personal history of nicotine dependence: Secondary | ICD-10-CM | POA: Insufficient documentation

## 2014-05-05 DIAGNOSIS — Z792 Long term (current) use of antibiotics: Secondary | ICD-10-CM | POA: Insufficient documentation

## 2014-05-05 DIAGNOSIS — I10 Essential (primary) hypertension: Secondary | ICD-10-CM | POA: Insufficient documentation

## 2014-05-05 DIAGNOSIS — Z9861 Coronary angioplasty status: Secondary | ICD-10-CM | POA: Insufficient documentation

## 2014-05-05 DIAGNOSIS — I2510 Atherosclerotic heart disease of native coronary artery without angina pectoris: Secondary | ICD-10-CM | POA: Insufficient documentation

## 2014-05-05 DIAGNOSIS — Z9089 Acquired absence of other organs: Secondary | ICD-10-CM | POA: Diagnosis not present

## 2014-05-05 DIAGNOSIS — I252 Old myocardial infarction: Secondary | ICD-10-CM | POA: Insufficient documentation

## 2014-05-05 DIAGNOSIS — Z7902 Long term (current) use of antithrombotics/antiplatelets: Secondary | ICD-10-CM | POA: Diagnosis not present

## 2014-05-05 DIAGNOSIS — E7800 Pure hypercholesterolemia, unspecified: Secondary | ICD-10-CM | POA: Insufficient documentation

## 2014-05-05 DIAGNOSIS — Z794 Long term (current) use of insulin: Secondary | ICD-10-CM | POA: Diagnosis not present

## 2014-05-05 DIAGNOSIS — Z8673 Personal history of transient ischemic attack (TIA), and cerebral infarction without residual deficits: Secondary | ICD-10-CM | POA: Insufficient documentation

## 2014-05-05 DIAGNOSIS — Z862 Personal history of diseases of the blood and blood-forming organs and certain disorders involving the immune mechanism: Secondary | ICD-10-CM | POA: Insufficient documentation

## 2014-05-05 DIAGNOSIS — K219 Gastro-esophageal reflux disease without esophagitis: Secondary | ICD-10-CM | POA: Insufficient documentation

## 2014-05-05 DIAGNOSIS — R197 Diarrhea, unspecified: Secondary | ICD-10-CM | POA: Insufficient documentation

## 2014-05-05 DIAGNOSIS — Z79899 Other long term (current) drug therapy: Secondary | ICD-10-CM | POA: Insufficient documentation

## 2014-05-05 DIAGNOSIS — Z87440 Personal history of urinary (tract) infections: Secondary | ICD-10-CM | POA: Insufficient documentation

## 2014-05-05 HISTORY — DX: Cerebral infarction, unspecified: I63.9

## 2014-05-05 MED ORDER — ONDANSETRON HCL 8 MG PO TABS: 8.0000 mg | ORAL_TABLET | Freq: Three times a day (TID) | ORAL | Status: DC | PRN

## 2014-05-05 MED ORDER — ONDANSETRON 4 MG PO TBDP
4.0000 mg | ORAL_TABLET | Freq: Once | ORAL | Status: AC
  Administered 2014-05-05: 4 mg via ORAL
  Filled 2014-05-05: qty 1

## 2014-05-05 NOTE — ED Notes (Signed)
Vomited x 2 this morning, diarrhea x 6 since 1100 after initial episode of constipation at 0300.  Denies abdominal pain or urinary symptoms.  Denies any blood or mucous in stool.

## 2014-05-05 NOTE — ED Notes (Signed)
Patient with no complaints at this time. Respirations even and unlabored. Skin warm/dry. Discharge instructions reviewed with patient at this time. Patient given opportunity to voice concerns/ask questions. Patient discharged at this time and left Emergency Department with steady gait.   

## 2014-05-05 NOTE — ED Provider Notes (Signed)
CSN: [id number]     Arrival date & time 05/05/14  1227 History   First MD Initiated Contact with Patient 05/05/14 1308     Chief Complaint  Patient presents with  . Diarrhea     (Consider location/radiation/quality/duration/timing/severity/associated sxs/prior Treatment) HPI  She complains of onset of diarrhea, last night, and had some vomiting, this morning. She had several episodes of each. She has not seen any blood in either. No known sick contacts. No fever. She denies weakness, or dizziness. She came here for evaluation by private vehicle. There are no other known modifying factors  Past Medical History  Diagnosis Date  . Back pain   . GERD (gastroesophageal reflux disease)   . MI (myocardial infarction)   . Hypercholesteremia   . Diabetes mellitus     x 20 yrs  . Anemia   . Cirrhosis   . Aortic sclerosis 07/04/2011  . Aortic insufficiency 07/04/2011    most recent 2D Echo 02/24/2012  EF greater than 55%  . Carotid stenosis, bilateral 07/04/2011    most recent 05/2010 carotid doppler  . OSA (obstructive sleep apnea) 07/04/2011    sleep study 2009  AHI 9.91/hr and during REM sleep 35.58/hr  . Presence of stent in right coronary artery 07/04/2011  . HTN (hypertension) 07/04/2011  . Coronary artery disease   . Myocardial infarction 1995  . GERD (gastroesophageal reflux disease)   . Non-alcoholic fatty liver disease   . UTI (lower urinary tract infection)     chronic/recurring/on bactrim  . CVA (cerebral infarction)     03/07/2014  . Stroke    Past Surgical History  Procedure Laterality Date  . Back surgery  1985  . Tonsillectomy    . Appendectomy    . Dilation and curettage of uterus    . Colonoscopy    . Eye surgery  08    cataract surgery of the left eye  . Ercp    . Abdominal hysterectomy  1972  . Cardiac catheterization  1995.,2006,1997    stent to the proximal RCA after MI   . Myringotomy  2012    both ears  . Hardware removal Right 01/17/2013   Procedure: REMOVAL OF HARDWARE AND EXCISION ULNAR STYLOID RIGHT WRIST;  Surgeon: Tennis Must, MD;  Location: [place];  Service: Orthopedics;  Laterality: Right;  . Wrist surgery      rt wrist hardwear removal  . Esophagogastroduodenoscopy N/A 04/01/2013    Procedure: ESOPHAGOGASTRODUODENOSCOPY (EGD);  Surgeon: Rogene Houston, MD;  [facility];  Service: Endoscopy;  Laterality: N/A;  230-rescheduled to 8:30am Ann notified pt  . Esophageal banding N/A 04/01/2013    Procedure: ESOPHAGEAL BANDING;  Surgeon: Rogene Houston, MD;  [facility];  Service: Endoscopy;  Laterality: N/A;  . Esophagogastroduodenoscopy N/A 05/24/2013    Procedure: ESOPHAGOGASTRODUODENOSCOPY (EGD);  Surgeon: Rogene Houston, MD;  [facility];  Service: Endoscopy;  Laterality: N/A;  730  . Esophageal banding N/A 05/24/2013    Procedure: ESOPHAGEAL BANDING;  Surgeon: Rogene Houston, MD;  [facility];  Service: Endoscopy;  Laterality: N/A;   Family History  Problem Relation Age of Onset  . Diabetes Mother   . Heart failure Father    History  Substance Use Topics  . Smoking status: Former Smoker -- 0.25 packs/day    Types: Cigarettes    Quit date: 01/13/2012  . Smokeless tobacco: Never Used  . Alcohol Use: No   OB  History   Grav Para Term Preterm Abortions TAB SAB Ect Mult Living                 Review of Systems  All other systems reviewed and are negative.     Allergies  Tape  Home Medications   Prior to Admission medications   Medication Sig Start Date End Date Taking? Authorizing Provider  aspirin 81 MG tablet Take 81 mg by mouth daily.   Yes Historical Provider, MD  carvedilol (COREG) 6.25 MG tablet Take 6.25 mg by mouth 2 (two) times daily with a meal.   Yes Historical Provider, MD  clopidogrel (PLAVIX) 75 MG tablet Take 1 tablet (75 mg total) by mouth daily. 03/07/14  Yes Lezlie Octave Black, NP  doxylamine, Sleep, (UNISOM) 25 MG tablet Take  25 mg by mouth at bedtime as needed for sleep.   Yes Historical Provider, MD  insulin glargine (LANTUS SOLOSTAR) 100 UNIT/ML injection Inject 72 Units into the skin at bedtime.    Yes Historical Provider, MD  isosorbide mononitrate (IMDUR) 30 MG 24 hr tablet Take 1 tablet (30 mg total) by mouth daily.   Yes Pixie Casino, MD  NOVOLOG MIX 70/30 (70-30) 100 UNIT/ML injection Inject 72 Units into the skin 2 (two) times daily with a meal.  04/11/14  Yes Historical Provider, MD  omeprazole (PRILOSEC OTC) 20 MG tablet Take 20 mg by mouth daily.   Yes Historical Provider, MD  rosuvastatin (CRESTOR) 40 MG tablet Take 1 tablet (40 mg total) by mouth at bedtime. 07/29/13  Yes Pixie Casino, MD  sulfamethoxazole-trimethoprim (BACTRIM DS) 800-160 MG per tablet Take 1 tablet by mouth daily.   Yes Historical Provider, MD  zolpidem (AMBIEN) 5 MG tablet Take 5 mg by mouth at bedtime.   Yes Historical Provider, MD  ondansetron (ZOFRAN) 8 MG tablet Take 1 tablet (8 mg total) by mouth every 8 (eight) hours as needed for nausea or vomiting. 05/05/14   Richarda Blade, MD   BP 131/55  Pulse 87  Temp(Src) 99.9 F (37.7 C) (Oral)  Resp 20  Ht 5' 3"  (1.6 m)  Wt 185 lb (83.915 kg)  BMI 32.78 kg/m2  SpO2 94% Physical Exam  Nursing note and vitals reviewed. Constitutional: She is oriented to person, place, and time. She appears well-developed and well-nourished. No distress.  HENT:  Head: Normocephalic and atraumatic.  Eyes: Conjunctivae and EOM are normal. Pupils are equal, round, and reactive to light.  Neck: Normal range of motion and phonation normal. Neck supple.  Cardiovascular: Normal rate, regular rhythm and intact distal pulses.   Pulmonary/Chest: Effort normal and breath sounds normal. She exhibits no tenderness.  Abdominal: Soft. She exhibits no distension. There is no tenderness. There is no guarding.  Musculoskeletal: Normal range of motion.  Neurological: She is alert and oriented to person,  place, and time. She exhibits normal muscle tone.  Skin: Skin is warm and dry.  Psychiatric: She has a normal mood and affect. Her behavior is normal. Judgment and thought content normal.    ED Course  Procedures (including critical care time)  Medications  ondansetron (ZOFRAN-ODT) disintegrating tablet 4 mg (4 mg Oral Given 05/05/14 1302)    Patient Vitals for the past 24 hrs:  BP Temp Temp src Pulse Resp SpO2 Height Weight  05/05/14 1244 131/55 mmHg 99.9 F (37.7 C) Oral 87 20 94 % 5' 3"  (1.6 m) 185 lb (83.915 kg)    2:04 PM Reevaluation with update and  discussion. After initial assessment and treatment, an updated evaluation reveals she is tolerating oral fluids at this time, and feels comfortable. Findings discussed with patient and all questions answered.. [facility] Review Labs Reviewed - No data to display  Imaging Review No results found.   EKG Interpretation None      MDM   Final diagnoses:  Non-intractable vomiting with nausea, vomiting of unspecified type  Diarrhea    Nonspecific, vomiting, and diarrhea, without fever. Possible gastroenteritis. Doubt complication of diabetes, sepsis, colitis or intestinal ischemia.  Nursing Notes Reviewed/ Care Coordinated Applicable Imaging Reviewed Interpretation of Laboratory Data incorporated into ED treatment  The patient appears reasonably screened and/or stabilized for discharge and I doubt any other medical condition or other [facility] requiring further screening, evaluation, or treatment in the ED at this time prior to discharge.  Plan: Home Medications- Zofran; Home Treatments- rest; return here if the recommended treatment, does not improve the symptoms; Recommended follow up- PCP prn  Richarda Blade, MD 05/05/14 1430

## 2014-05-05 NOTE — ED Notes (Signed)
Provided gingerale

## 2014-05-05 NOTE — Discharge Instructions (Signed)
Use Tylenol, if needed, for pain. Use Kaopectate, or Imodium if needed for diarrhea. Get plenty of rest and drink a lot of fluids. Gradually advance your diet over the next several days.   Diarrhea Diarrhea is frequent loose and watery bowel movements. It can cause you to feel weak and dehydrated. Dehydration can cause you to become tired and thirsty, have a dry mouth, and have decreased urination that often is dark yellow. Diarrhea is a sign of another problem, most often an infection that will not last long. In most cases, diarrhea typically lasts 2-3 days. However, it can last longer if it is a sign of something more serious. It is important to treat your diarrhea as directed by your caregiver to lessen or prevent future episodes of diarrhea. CAUSES  Some common causes include:  Gastrointestinal infections caused by viruses, bacteria, or parasites.  Food poisoning or food allergies.  Certain medicines, such as antibiotics, chemotherapy, and laxatives.  Artificial sweeteners and fructose.  Digestive disorders. HOME CARE INSTRUCTIONS  Ensure adequate fluid intake (hydration): Have 1 cup (8 oz) of fluid for each diarrhea episode. Avoid fluids that contain simple sugars or sports drinks, fruit juices, whole milk products, and sodas. Your urine should be clear or pale yellow if you are drinking enough fluids. Hydrate with an oral rehydration solution that you can purchase at pharmacies, retail stores, and online. You can prepare an oral rehydration solution at home by mixing the following ingredients together:   - tsp table salt.   tsp baking soda.   tsp salt substitute containing potassium chloride.  1  tablespoons sugar.  1 L (34 oz) of water.  Certain foods and beverages may increase the speed at which food moves through the gastrointestinal (GI) tract. These foods and beverages should be avoided and include:  Caffeinated and alcoholic beverages.  High-fiber foods, such as raw  fruits and vegetables, nuts, seeds, and whole grain breads and cereals.  Foods and beverages sweetened with sugar alcohols, such as xylitol, sorbitol, and mannitol.  Some foods may be well tolerated and may help thicken stool including:  Starchy foods, such as rice, toast, pasta, low-sugar cereal, oatmeal, grits, baked potatoes, crackers, and bagels.  Bananas.  Applesauce.  Add probiotic-rich foods to help increase healthy bacteria in the GI tract, such as yogurt and fermented milk products.  Wash your hands well after each diarrhea episode.  Only take over-the-counter or prescription medicines as directed by your caregiver.  Take a warm bath to relieve any burning or pain from frequent diarrhea episodes. SEEK IMMEDIATE MEDICAL CARE IF:   You are unable to keep fluids down.  You have persistent vomiting.  You have blood in your stool, or your stools are black and tarry.  You do not urinate in 6-8 hours, or there is only a small amount of very dark urine.  You have abdominal pain that increases or localizes.  You have weakness, dizziness, confusion, or light-headedness.  You have a severe headache.  Your diarrhea gets worse or does not get better.  You have a fever or persistent symptoms for more than 2-3 days.  You have a fever and your symptoms suddenly get worse. MAKE SURE YOU:   Understand these instructions.  Will watch your condition.  Will get help right away if you are not doing well or get worse. Document Released: 08/15/2002 Document Revised: 01/09/2014 Document Reviewed: 05/02/2012 [facility] Patient Information 2015 [place], [place]. This information is not intended to replace advice given to you  by your health care provider. Make sure you discuss any questions you have with your health care provider.  Nausea and Vomiting Nausea is a sick feeling that often comes before throwing up (vomiting). Vomiting is a reflex where stomach contents come out of your  mouth. Vomiting can cause severe loss of body fluids (dehydration). Children and elderly adults can become dehydrated quickly, especially if they also have diarrhea. Nausea and vomiting are symptoms of a condition or disease. It is important to find the cause of your symptoms. CAUSES   Direct irritation of the stomach lining. This irritation can result from increased acid production (gastroesophageal reflux disease), infection, food poisoning, taking certain medicines (such as nonsteroidal anti-inflammatory drugs), alcohol use, or tobacco use.  Signals from the brain.These signals could be caused by a headache, heat exposure, an inner ear disturbance, increased pressure in the brain from injury, infection, a tumor, or a concussion, pain, emotional stimulus, or metabolic problems.  An obstruction in the gastrointestinal tract (bowel obstruction).  Illnesses such as diabetes, hepatitis, gallbladder problems, appendicitis, kidney problems, cancer, sepsis, atypical symptoms of a heart attack, or eating disorders.  Medical treatments such as chemotherapy and radiation.  Receiving medicine that makes you sleep (general anesthetic) during surgery. DIAGNOSIS Your caregiver may ask for tests to be done if the problems do not improve after a few days. Tests may also be done if symptoms are severe or if the reason for the nausea and vomiting is not clear. Tests may include:  Urine tests.  Blood tests.  Stool tests.  Cultures (to look for evidence of infection).  X-rays or other imaging studies. Test results can help your caregiver make decisions about treatment or the need for additional tests. TREATMENT You need to stay well hydrated. Drink frequently but in small amounts.You may wish to drink water, sports drinks, clear broth, or eat frozen ice pops or gelatin dessert to help stay hydrated.When you eat, eating slowly may help prevent nausea.There are also some antinausea medicines that may  help prevent nausea. HOME CARE INSTRUCTIONS   Take all medicine as directed by your caregiver.  If you do not have an appetite, do not force yourself to eat. However, you must continue to drink fluids.  If you have an appetite, eat a normal diet unless your caregiver tells you differently.  Eat a variety of complex carbohydrates (rice, wheat, potatoes, bread), lean meats, yogurt, fruits, and vegetables.  Avoid high-fat foods because they are more difficult to digest.  Drink enough water and fluids to keep your urine clear or pale yellow.  If you are dehydrated, ask your caregiver for specific rehydration instructions. Signs of dehydration may include:  Severe thirst.  Dry lips and mouth.  Dizziness.  Dark urine.  Decreasing urine frequency and amount.  Confusion.  Rapid breathing or pulse. SEEK IMMEDIATE MEDICAL CARE IF:   You have blood or brown flecks (like coffee grounds) in your vomit.  You have black or bloody stools.  You have a severe headache or stiff neck.  You are confused.  You have severe abdominal pain.  You have chest pain or trouble breathing.  You do not urinate at least once every 8 hours.  You develop cold or clammy skin.  You continue to vomit for longer than 24 to 48 hours.  You have a fever. MAKE SURE YOU:   Understand these instructions.  Will watch your condition.  Will get help right away if you are not doing well or get worse.  Document Released: 08/25/2005 Document Revised: 11/17/2011 Document Reviewed: 01/22/2011 [facility] Patient Information 2015 [place], [place]. This information is not intended to replace advice given to you by your health care provider. Make sure you discuss any questions you have with your health care provider. Clear Liquid Diet A clear liquid diet is a short-term diet that is prescribed to provide the necessary fluid and basic energy you need when you can have nothing else. The clear liquid diet consists of  liquids or solids that will become liquid at room temperature. You should be able to see through the liquid. There are many reasons that you may be restricted to clear liquids, such as:  When you have a sudden-onset (acute) condition that occurs before or after surgery.  To help your body slowly get adjusted to food again after a long period when you were unable to have food.  Replacement of fluids when you have a diarrheal disease.  When you are going to have certain exams, such as a colonoscopy, in which instruments are inserted inside your body to look at parts of your digestive system. WHAT CAN I HAVE? A clear liquid diet does not provide all the nutrients you need. It is important to choose a variety of the following items to get as many nutrients as possible:  Vegetable juices that do not have pulp.  Fruit juices and fruit drinks that do not have pulp.  Coffee (regular or decaffeinated), tea, or soda at the discretion of your health care provider.  Clear bouillon, broth, or strained broth-based soups.  High-protein and flavored gelatins.  Sugar or honey.  Ices or frozen ice pops that do not contain milk. If you are not sure whether you can have certain items, you should ask your health care provider. You may also ask your health care provider if there are any other clear liquid options. Document Released: 08/25/2005 Document Revised: 08/30/2013 Document Reviewed: 07/22/2013 [facility] Patient Information 2015 [place], [place]. This information is not intended to replace advice given to you by your health care provider. Make sure you discuss any questions you have with your health care provider. Low-Fiber Diet Fiber is found in fruits, vegetables, and whole grains. A low-fiber diet restricts fibrous foods that are not digested in the small intestine. A diet containing about 10-15 grams of fiber per day is considered low fiber. Low-fiber diets may be used to:  Promote healing and rest  the bowel during intestinal flare-ups.  Prevent blockage of a partially obstructed or narrowed gastrointestinal tract.  Reduce fecal weight and volume.  Slow the movement of feces. You may be on a low-fiber diet as a transitional diet following surgery, after an injury (trauma), or because of a short (acute) or lifelong (chronic) illness. Your health care provider will determine the length of time you need to stay on this diet.  WHAT DO I NEED TO KNOW ABOUT A LOW-FIBER DIET? Always check the fiber content on the packaging's Nutrition Facts label, especially on foods from the grains list. Ask your dietitian if you have questions about specific foods that are related to your condition, especially if the food is not listed below. In general, a low-fiber food will have less than 2 g of fiber. WHAT FOODS CAN I EAT? Grains All breads and crackers made with white flour. Sweet rolls, doughnuts, waffles, pancakes, [place] toast, bagels. Pretzels, Melba toast, zwieback. Well-cooked cereals, such as cornmeal, farina, or cream cereals. Dry cereals that do not contain whole grains, fruit, or nuts, such  as refined corn, wheat, rice, and oat cereals. Potatoes prepared any way without skins, plain pastas and noodles, refined white rice. Use white flour for baking and making sauces. Use allowed list of grains for casseroles, dumplings, and puddings.  Vegetables Strained tomato and vegetable juices. Fresh lettuce, cucumber, spinach. Well-cooked (no skin or pulp) or canned vegetables, such as asparagus, bean sprouts, beets, carrots, green beans, mushrooms, potatoes, pumpkin, spinach, yellow squash, tomato sauce/puree, turnips, yams, and zucchini. Keep servings limited to  cup.  Fruits All fruit juices except prune juice. Cooked or canned fruits without skin and seeds, such as applesauce, apricots, cherries, fruit cocktail, grapefruit, grapes, mandarin oranges, melons, peaches, pears, pineapple, and plums. Fresh fruits  without skin, such as apricots, avocados, bananas, melons, pineapple, nectarines, and peaches. Keep servings limited to  cup or 1 piece.  Meat and Other Protein Sources Ground or well-cooked tender beef, ham, veal, lamb, pork, or poultry. Eggs, plain cheese. Fish, oysters, shrimp, lobster, and other seafood. Liver, organ meats. Smooth nut butters. Dairy All milk products and alternative dairy substitutes, such as soy, rice, almond, and coconut, not containing added whole nuts, seeds, or added fruit. Beverages Decaf coffee, fruit, and vegetable juices or smoothies (small amounts, with no pulp or skins, and with fruits from allowed list), sports drinks, herbal tea. Condiments Ketchup, mustard, vinegar, cream sauce, cheese sauce, cocoa powder. Spices in moderation, such as allspice, basil, bay leaves, celery powder or leaves, cinnamon, cumin powder, curry powder, ginger, mace, marjoram, onion or garlic powder, oregano, paprika, parsley flakes, ground pepper, rosemary, sage, savory, tarragon, thyme, and turmeric. Sweets and Desserts Plain cakes and cookies, pie made with allowed fruit, pudding, custard, cream pie. Gelatin, fruit, ice, sherbet, frozen ice pops. Ice cream, ice milk without nuts. Plain hard candy, honey, jelly, molasses, syrup, sugar, chocolate syrup, gumdrops, marshmallows. Limit overall sugar intake.  Fats and Oil Margarine, butter, cream, mayonnaise, salad oils, plain salad dressings made from allowed foods. Choose healthy fats such as olive oil, canola oil, and omega-3 fatty acids (such as found in salmon or tuna) when possible.  Other Bouillon, broth, or cream soups made from allowed foods. Any strained soup. Casseroles or mixed dishes made with allowed foods. The items listed above may not be a complete list of recommended foods or beverages. Contact your dietitian for more options.  WHAT FOODS ARE NOT RECOMMENDED? Grains All whole wheat and whole grain breads and crackers.  Multigrains, rye, bran seeds, nuts, or coconut. Cereals containing whole grains, multigrains, bran, coconut, nuts, raisins. Cooked or dry oatmeal, steel-cut oats. Coarse wheat cereals, granola. Cereals advertised as high fiber. Potato skins. Whole grain pasta, wild or brown rice. Popcorn. Coconut flour. Bran, buckwheat, corn bread, multigrains, rye, wheat germ.  Vegetables Fresh, cooked or canned vegetables, such as artichokes, asparagus, beet greens, broccoli, Brussels sprouts, cabbage, celery, cauliflower, corn, eggplant, kale, legumes or beans, okra, peas, and tomatoes. Avoid large servings of any vegetables, especially raw vegetables.  Fruits Fresh fruits, such as apples with or without skin, berries, cherries, figs, grapes, grapefruit, guavas, kiwis, mangoes, oranges, papayas, pears, persimmons, pineapple, and pomegranate. Prune juice and juices with pulp, stewed or dried prunes. Dried fruits, dates, raisins. Fruit seeds or skins. Avoid large servings of all fresh fruits. Meats and Other Protein Sources Tough, fibrous meats with gristle. Chunky nut butter. Cheese made with seeds, nuts, or other foods not recommended. Nuts, seeds, legumes (beans, including baked beans), dried peas, beans, lentils.  Dairy Yogurt or cheese that contains nuts, seeds,  or added fruit.  Beverages Fruit juices with high pulp, prune juice. Caffeinated coffee and teas.  Condiments Coconut, maple syrup, pickles, olives. Sweets and Desserts Desserts, cookies, or candies that contain nuts or coconut, chunky peanut butter, dried fruits. Jams, preserves with seeds, marmalade. Large amounts of sugar and sweets. Any other dessert made with fruits from the not recommended list.  Other Soups made from vegetables that are not recommended or that contain other foods not recommended.  The items listed above may not be a complete list of foods and beverages to avoid. Contact your dietitian for more information. Document Released:  02/14/2002 Document Revised: 08/30/2013 Document Reviewed: 07/18/2013 [facility] Patient Information 2015 [place], [place]. This information is not intended to replace advice given to you by your health care provider. Make sure you discuss any questions you have with your health care provider.

## 2014-05-05 NOTE — ED Notes (Addendum)
Diarrhea x3 , nausea, no vomiting, No abd pain.    Had a BM on MOnday, and awakened at 3am and felt constipated , then had onset of diarrhea

## 2014-05-12 ENCOUNTER — Ambulatory Visit (HOSPITAL_COMMUNITY)
Admission: RE | Admit: 2014-05-12 | Discharge: 2014-05-12 | Disposition: A | Discharge status: Home / Self Care | Payer: Medicare Other | Source: Ambulatory Visit | Attending: Neurology | Admitting: Neurology

## 2014-05-12 DIAGNOSIS — I69922 Dysarthria following unspecified cerebrovascular disease: Secondary | ICD-10-CM | POA: Insufficient documentation

## 2014-05-12 DIAGNOSIS — R471 Dysarthria and anarthria: Secondary | ICD-10-CM | POA: Diagnosis present

## 2014-06-07 ENCOUNTER — Encounter (HOSPITAL_COMMUNITY): Payer: Self-pay | Admitting: Pharmacy Technician

## 2014-06-21 ENCOUNTER — Ambulatory Visit (HOSPITAL_COMMUNITY)
Admission: RE | Admit: 2014-06-21 | Discharge: 2014-06-21 | Disposition: A | Discharge status: Home / Self Care | Payer: Medicare Other | Source: Ambulatory Visit | Attending: Internal Medicine | Admitting: Internal Medicine

## 2014-06-21 ENCOUNTER — Encounter (HOSPITAL_COMMUNITY): Payer: Self-pay | Admitting: *Deleted

## 2014-06-21 ENCOUNTER — Encounter (HOSPITAL_COMMUNITY)
Admission: RE | Disposition: A | Discharge status: Home / Self Care | Payer: Self-pay | Source: Ambulatory Visit | Attending: Internal Medicine

## 2014-06-21 DIAGNOSIS — I700 Atherosclerosis of aorta: Secondary | ICD-10-CM | POA: Insufficient documentation

## 2014-06-21 DIAGNOSIS — K766 Portal hypertension: Secondary | ICD-10-CM | POA: Insufficient documentation

## 2014-06-21 DIAGNOSIS — Z8673 Personal history of transient ischemic attack (TIA), and cerebral infarction without residual deficits: Secondary | ICD-10-CM | POA: Insufficient documentation

## 2014-06-21 DIAGNOSIS — K7460 Unspecified cirrhosis of liver: Secondary | ICD-10-CM

## 2014-06-21 DIAGNOSIS — K219 Gastro-esophageal reflux disease without esophagitis: Secondary | ICD-10-CM | POA: Diagnosis not present

## 2014-06-21 DIAGNOSIS — K449 Diaphragmatic hernia without obstruction or gangrene: Secondary | ICD-10-CM | POA: Diagnosis not present

## 2014-06-21 DIAGNOSIS — K7030 Alcoholic cirrhosis of liver without ascites: Secondary | ICD-10-CM | POA: Insufficient documentation

## 2014-06-21 DIAGNOSIS — I252 Old myocardial infarction: Secondary | ICD-10-CM | POA: Insufficient documentation

## 2014-06-21 DIAGNOSIS — I8501 Esophageal varices with bleeding: Secondary | ICD-10-CM

## 2014-06-21 DIAGNOSIS — K31819 Angiodysplasia of stomach and duodenum without bleeding: Secondary | ICD-10-CM

## 2014-06-21 DIAGNOSIS — I8510 Secondary esophageal varices without bleeding: Secondary | ICD-10-CM | POA: Diagnosis present

## 2014-06-21 DIAGNOSIS — K760 Fatty (change of) liver, not elsewhere classified: Secondary | ICD-10-CM | POA: Insufficient documentation

## 2014-06-21 DIAGNOSIS — I6523 Occlusion and stenosis of bilateral carotid arteries: Secondary | ICD-10-CM | POA: Insufficient documentation

## 2014-06-21 DIAGNOSIS — K3189 Other diseases of stomach and duodenum: Secondary | ICD-10-CM

## 2014-06-21 DIAGNOSIS — K589 Irritable bowel syndrome without diarrhea: Secondary | ICD-10-CM | POA: Insufficient documentation

## 2014-06-21 DIAGNOSIS — I10 Essential (primary) hypertension: Secondary | ICD-10-CM | POA: Insufficient documentation

## 2014-06-21 DIAGNOSIS — Z7982 Long term (current) use of aspirin: Secondary | ICD-10-CM | POA: Insufficient documentation

## 2014-06-21 DIAGNOSIS — E780 Pure hypercholesterolemia: Secondary | ICD-10-CM | POA: Diagnosis not present

## 2014-06-21 DIAGNOSIS — E119 Type 2 diabetes mellitus without complications: Secondary | ICD-10-CM | POA: Insufficient documentation

## 2014-06-21 DIAGNOSIS — G4733 Obstructive sleep apnea (adult) (pediatric): Secondary | ICD-10-CM | POA: Insufficient documentation

## 2014-06-21 DIAGNOSIS — Z794 Long term (current) use of insulin: Secondary | ICD-10-CM | POA: Diagnosis not present

## 2014-06-21 DIAGNOSIS — I8500 Esophageal varices without bleeding: Secondary | ICD-10-CM

## 2014-06-21 DIAGNOSIS — I2510 Atherosclerotic heart disease of native coronary artery without angina pectoris: Secondary | ICD-10-CM | POA: Diagnosis not present

## 2014-06-21 DIAGNOSIS — D500 Iron deficiency anemia secondary to blood loss (chronic): Secondary | ICD-10-CM

## 2014-06-21 HISTORY — PX: ESOPHAGOGASTRODUODENOSCOPY: SHX5428

## 2014-06-21 HISTORY — DX: Irritable bowel syndrome, unspecified: K58.9

## 2014-06-21 HISTORY — DX: Diaphragmatic hernia without obstruction or gangrene: K44.9

## 2014-06-21 HISTORY — PX: ESOPHAGEAL BANDING: SHX5518

## 2014-06-21 HISTORY — DX: Esophageal varices without bleeding: I85.00

## 2014-06-21 LAB — HEMOGLOBIN AND HEMATOCRIT, BLOOD
HEMATOCRIT: 31.4 % — AB (ref 36.0–46.0)
Hemoglobin: 10.4 g/dL — ABNORMAL LOW (ref 12.0–15.0)

## 2014-06-21 LAB — GLUCOSE, CAPILLARY: Glucose-Capillary: 109 mg/dL — ABNORMAL HIGH (ref 70–99)

## 2014-06-21 SURGERY — EGD (ESOPHAGOGASTRODUODENOSCOPY)
Anesthesia: Moderate Sedation

## 2014-06-21 MED ORDER — MIDAZOLAM HCL 5 MG/5ML IJ SOLN
INTRAMUSCULAR | Status: AC
  Filled 2014-06-21: qty 10

## 2014-06-21 MED ORDER — BUTAMBEN-TETRACAINE-BENZOCAINE 2-2-14 % EX AERO
INHALATION_SPRAY | CUTANEOUS | Status: DC | PRN
  Administered 2014-06-21: 2 via TOPICAL

## 2014-06-21 MED ORDER — MEPERIDINE HCL 50 MG/ML IJ SOLN
INTRAMUSCULAR | Status: AC
  Filled 2014-06-21: qty 1

## 2014-06-21 MED ORDER — MEPERIDINE HCL 50 MG/ML IJ SOLN
INTRAMUSCULAR | Status: DC | PRN
  Administered 2014-06-21 (×2): 25 mg via INTRAVENOUS

## 2014-06-21 MED ORDER — MIDAZOLAM HCL 5 MG/5ML IJ SOLN
INTRAMUSCULAR | Status: DC | PRN
  Administered 2014-06-21: 1 mg via INTRAVENOUS
  Administered 2014-06-21 (×3): 2 mg via INTRAVENOUS

## 2014-06-21 MED ORDER — SODIUM CHLORIDE 0.9 % IV SOLN
INTRAVENOUS | Status: DC
  Administered 2014-06-21: 11:00:00 via INTRAVENOUS

## 2014-06-21 MED ORDER — STERILE WATER FOR IRRIGATION IR SOLN
Status: DC | PRN
  Administered 2014-06-21: 12:00:00

## 2014-06-21 NOTE — H&P (Signed)
[name] is an 73 y.o. female.   Chief Complaint: Patient is here for EGD and possible esophageal varices and banding. HPI: Patient is a 73 year old Caucasian female who was cirrhosis secondary to NAFLD complicated by a superficial variceal bleed last year and she underwent first banding session in July of 2014. Subsequent banding session was in September, 2014. She hasn't had any more episodes of hematemesis melena or rectal bleeding. She has been on low-dose aspirin because of history of CVA in June 2015. Hemoglobin 7 weeks ago was 11.8 g.  Past Medical History  Diagnosis Date  . Back pain   . GERD (gastroesophageal reflux disease)   . MI (myocardial infarction)   . Hypercholesteremia   . Anemia   . Cirrhosis   . Aortic sclerosis 07/04/2011  . Aortic insufficiency 07/04/2011    most recent 2D Echo 02/24/2012  EF greater than 55%  . Carotid stenosis, bilateral 07/04/2011    most recent 05/2010 carotid doppler  . OSA (obstructive sleep apnea) 07/04/2011    sleep study 2009  AHI 9.91/hr and during REM sleep 35.58/hr  . Presence of stent in right coronary artery 07/04/2011  . HTN (hypertension) 07/04/2011  . Coronary artery disease   . Myocardial infarction 1995  . GERD (gastroesophageal reflux disease)   . Non-alcoholic fatty liver disease   . UTI (lower urinary tract infection)     chronic/recurring/on bactrim  . CVA (cerebral infarction)     03/07/2014  . Hiatal hernia   . IBS (irritable bowel syndrome)   . Diabetes mellitus 2005  . Varices, esophageal   . Stroke 03/07/2014    Past Surgical History  Procedure Laterality Date  . Back surgery  1985  . Tonsillectomy    . Appendectomy    . Colonoscopy    . Eye surgery  08    cataract surgery of the left eye  . Ercp    . Myringotomy  2012    both ears  . Hardware removal Right 01/17/2013    Procedure: REMOVAL OF HARDWARE AND EXCISION ULNAR STYLOID RIGHT WRIST;  Surgeon: Tennis Must, MD;  Location: [place];  Service: Orthopedics;  Laterality: Right;  . Wrist surgery      rt wrist hardwear removal  . Esophagogastroduodenoscopy N/A 04/01/2013    Procedure: ESOPHAGOGASTRODUODENOSCOPY (EGD);  Surgeon: Rogene Houston, MD;  [facility];  Service: Endoscopy;  Laterality: N/A;  230-rescheduled to 8:30am Ann notified pt  . Esophageal banding N/A 04/01/2013    Procedure: ESOPHAGEAL BANDING;  Surgeon: Rogene Houston, MD;  [facility];  Service: Endoscopy;  Laterality: N/A;  . Esophagogastroduodenoscopy N/A 05/24/2013    Procedure: ESOPHAGOGASTRODUODENOSCOPY (EGD);  Surgeon: Rogene Houston, MD;  [facility];  Service: Endoscopy;  Laterality: N/A;  730  . Esophageal banding N/A 05/24/2013    Procedure: ESOPHAGEAL BANDING;  Surgeon: Rogene Houston, MD;  [facility];  Service: Endoscopy;  Laterality: N/A;  . Dilation and curettage of uterus      x2  . Vaginal hysterectomy  1972  . Anal fissure repair  1972  . Cardiac catheterization  1995.,2006,1997    stent to the proximal RCA after MI   . Epi retinal membrane peel Left     Family History  Problem Relation Age of Onset  . Diabetes Mother   . Heart failure Father    Social History:  reports that she quit smoking about 2 years ago.  Her smoking use included Cigarettes. She smoked 0.25 packs per day. She has never used smokeless tobacco. She reports that she does not drink alcohol or use illicit drugs.  Allergies:  Allergies  Allergen Reactions  . Tape Rash    ADHESIVE TAPE    Medications Prior to Admission  Medication Sig Dispense Refill  . aspirin 81 MG tablet Take 81 mg by mouth daily.      . carvedilol (COREG) 6.25 MG tablet Take 6.25 mg by mouth 2 (two) times daily with a meal.      . insulin glargine (LANTUS SOLOSTAR) 100 UNIT/ML injection Inject 70 Units into the skin at bedtime.       Marland Kitchen NOVOLOG MIX 70/30 (70-30) 100 UNIT/ML injection Inject 72 Units into the skin 2 (two) times daily  with a meal.       . omeprazole (PRILOSEC OTC) 20 MG tablet Take 20 mg by mouth daily.      . rosuvastatin (CRESTOR) 40 MG tablet Take 1 tablet (40 mg total) by mouth at bedtime.  30 tablet  9  . sulfamethoxazole-trimethoprim (BACTRIM DS) 800-160 MG per tablet Take 1 tablet by mouth daily.      Marland Kitchen zolpidem (AMBIEN) 5 MG tablet Take 5 mg by mouth at bedtime.      . ondansetron (ZOFRAN) 8 MG tablet Take 1 tablet (8 mg total) by mouth every 8 (eight) hours as needed for nausea or vomiting.  20 tablet  0    Results for orders placed during the hospital encounter of 06/21/14 (from the past 48 hour(s))  GLUCOSE, CAPILLARY     Status: Abnormal   Collection Time    06/21/14 12:11 PM      Result Value Ref Range   Glucose-Capillary 109 (*) 70 - 99 mg/dL   No results found.  ROS  Blood pressure 132/57, pulse 58, temperature 98 F (36.7 C), temperature source Oral, resp. rate 15, height 5' 3"  (1.6 m), weight 185 lb (83.915 kg), SpO2 97.00%. Physical Exam  Constitutional: She appears well-developed and well-nourished.  HENT:  Mouth/Throat: Oropharynx is clear and moist.  Eyes: Conjunctivae are normal. No scleral icterus.  Neck: No thyromegaly present.  GI: Soft.  Lymphadenopathy:    She has no cervical adenopathy.     Assessment/Plan History of esophageal varices. Cirrhosis secondary to NAFLD. EGD with possible esophageal variceal banding.  Juventino Pavone U 06/21/2014, 12:15 PM

## 2014-06-21 NOTE — Discharge Instructions (Signed)
Resume medications. Full liquids today and usual diet starting in a.m. No driving for 24 hours. Physician will call with results of blood test.

## 2014-06-21 NOTE — Op Note (Addendum)
EGD PROCEDURE REPORT  PATIENT:  [name]  MR#:  [id number] Birthdate:  Feb 05, 1941, 73 y.o., female Endoscopist:  Dr. Rogene Houston, MD Referred By:  Dr. Sherrilee Gilles. Gerarda Fraction, MD  Procedure Date: 06/21/2014  Procedure:   EGD with esophageal variceal banding.  Indications:  Patient is 73 year old Caucasian female with cirrhosis secondary to half old complicated by esophageal variceal bleed last year. She is now returning for EGD and banding for recurrent varices. She was hospitalized about 3 months ago in order to have heme positive stool and low hemoglobin but there is no history of melena or  rectal bleeding. Hemoglobin 7 weeks ago was up to 11.8 g. Patient is on low-dose aspirin because of history of CVA(4 months ago)            Informed Consent:  The risks, benefits, alternatives & imponderables which include, but are not limited to, bleeding, infection, perforation, drug reaction and potential missed lesion have been reviewed.  The potential for biopsy, lesion removal, esophageal dilation, etc. have also been discussed.  Questions have been answered.  All parties agreeable.  Please see history & physical in medical record for more information.  Medications:  Demerol 50 mg IV Versed 7 mg IV Cetacaine spray topically for oropharyngeal anesthesia  Description of procedure:  The endoscope was introduced through the mouth and advanced to the second portion of the duodenum without difficulty or limitations. The mucosal surfaces were surveyed very carefully during advancement of the scope and upon withdrawal.  Findings:  Esophagus:  Mucosa proximal and middle third was normal. Scarring noted in distal esophagus from previous banding. Short volume of aches noted just proximal to GE junction and an other column above that. These were grade II to III. other varices were very small. GEJ:  37 cm Stomach:  Stomach was empty and distended very well with insufflation. Folds in the proximal stomach  were normal. Mosaic pattern noted to gastric mucosa at body. Multiple telangiectasia noted involving antral mucosa with one large patch with bright red color but no active bleeding noted. Pyloric channel was patent. Angularis was unremarkable. No fundal varices present. Duodenum:  Normal bulbar and post bulbar mucosa.  Therapeutic/Diagnostic Maneuvers Performed:   Endoscope was withdrawn. Multiple banding device was Loaded onto the scope and was reintroduced into the esophagus. Both columns were banded starting with one located distally.  Complications:  None  Impression: Two columns of esophageal varices were banded. Portal gastropathy. 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  Recommendations:  Standard instructions given. Will check H&H and AFP level today. Office visit in 4 months.  REHMAN,NAJEEB U  06/21/2014  12:54 PM  CC: Dr. Leonides Grills, MD & Dr. Rayne Du ref. provider found

## 2014-06-22 ENCOUNTER — Encounter (HOSPITAL_COMMUNITY): Payer: Self-pay | Admitting: Internal Medicine

## 2014-06-22 LAB — AFP TUMOR MARKER: AFP TUMOR MARKER: 7.2 ng/mL — AB (ref <6.1)

## 2014-06-26 ENCOUNTER — Telehealth (INDEPENDENT_AMBULATORY_CARE_PROVIDER_SITE_OTHER): Payer: Self-pay | Admitting: *Deleted

## 2014-06-26 DIAGNOSIS — K7460 Unspecified cirrhosis of liver: Secondary | ICD-10-CM

## 2014-06-26 NOTE — Telephone Encounter (Signed)
Per Dr.Rehman the patient will need to have labs drawn in 3 months prior to seeing Lelon Perla Labs are noted for January 19,2016 Butch Penny)

## 2014-06-27 NOTE — Patient Instructions (Signed)
[name]  06/27/2014   Your procedure is scheduled on:  07/04/2014  Report to [facility] at 10:00 AM.  Call this number if you have problems the morning of surgery: [phone]   Remember:   Do not eat food or drink liquids after midnight.   Take these medicines the morning of surgery with A SIP OF WATER: Coreg, Prilosec, Zofran    (take only 1/2 pm dose of insulin on 07/03/14)   Do not wear jewelry, make-up or nail polish.  Do not wear lotions, powders, or perfumes. You may wear deodorant.  Do not shave 48 hours prior to surgery. Men may shave face and neck.  Do not bring valuables to the hospital.  [facility] is not responsible for any belongings or valuables.               Contacts, dentures or bridgework may not be worn into surgery.  Leave suitcase in the car. After surgery it may be brought to your room.  For patients admitted to the hospital, discharge time is determined by your treatment team.               Patients discharged the day of surgery will not be allowed to drive home.  Name and phone number of your driver:   Special Instructions: N/A   Please read over the following fact sheets that you were given: Anesthesia Post-op Instructions   PATIENT INSTRUCTIONS POST-ANESTHESIA  IMMEDIATELY FOLLOWING SURGERY:  Do not drive or operate machinery for the first twenty four hours after surgery.  Do not make any important decisions for twenty four hours after surgery or while taking narcotic pain medications or sedatives.  If you develop intractable nausea and vomiting or a severe headache please notify your doctor immediately.  FOLLOW-UP:  Please make an appointment with your surgeon as instructed. You do not need to follow up with anesthesia unless specifically instructed to do so.  WOUND CARE INSTRUCTIONS (if applicable):  Keep a dry clean dressing on the anesthesia/puncture wound site if there is drainage.  Once the wound has quit draining you may leave it open to  air.  Generally you should leave the bandage intact for twenty four hours unless there is drainage.  If the epidural site drains for more than 36-48 hours please call the anesthesia department.  QUESTIONS?:  Please feel free to call your physician or the hospital operator if you have any questions, and they will be happy to assist you.      Cataract A cataract is a clouding of the lens of the eye. When a lens becomes cloudy, vision is reduced based on the degree and nature of the clouding. Many cataracts reduce vision to some degree. Some cataracts make people more near-sighted as they develop. Other cataracts increase glare. Cataracts that are ignored and become worse can sometimes look white. The white color can be seen through the pupil. CAUSES   Aging. However, cataracts may occur at any age, even in newborns.  Certain drugs.  Trauma to the eye.  Certain diseases such as diabetes.  Specific eye diseases such as chronic inflammation inside the eye or a sudden attack of a rare form of glaucoma.  Inherited or acquired medical problems. SYMPTOMS   Gradual, progressive drop in vision in the affected eye.  Severe, rapid visual loss. This most often happens when trauma is the cause. DIAGNOSIS  To detect a cataract, an eye doctor examines the lens. Cataracts are best diagnosed with  an exam of the eyes with the pupils enlarged (dilated) by drops.  TREATMENT  For an early cataract, vision may improve by using different eyeglasses or stronger lighting. If that does not help your vision, surgery is the only effective treatment. A cataract needs to be surgically removed when vision loss interferes with your everyday activities, such as driving, reading, or watching TV. A cataract may also have to be removed if it prevents examination or treatment of another eye problem. Surgery removes the cloudy lens and usually replaces it with a substitute lens (intraocular lens, IOL).  At a time when both you  and your doctor agree, the cataract will be surgically removed. If you have cataracts in both eyes, only one is usually removed at a time. This allows the operated eye to heal and be out of danger from any possible problems after surgery (such as infection or poor wound healing). In rare cases, a cataract may be doing damage to your eye. In these cases, your caregiver may advise surgical removal right away. The vast majority of people who have cataract surgery have better vision afterward. HOME CARE INSTRUCTIONS  If you are not planning surgery, you may be asked to do the following:  Use different eyeglasses.  Use stronger or brighter lighting.  Ask your eye doctor about reducing your medicine dose or changing medicines if it is thought that a medicine caused your cataract. Changing medicines does not make the cataract go away on its own.  Become familiar with your surroundings. Poor vision can lead to injury. Avoid bumping into things on the affected side. You are at a higher risk for tripping or falling.  Exercise extreme care when driving or operating machinery.  Wear sunglasses if you are sensitive to bright light or experiencing problems with glare. SEEK IMMEDIATE MEDICAL CARE IF:   You have a worsening or sudden vision loss.  You notice redness, swelling, or increasing pain in the eye.  You have a fever. Document Released: 08/25/2005 Document Revised: 11/17/2011 Document Reviewed: 04/18/2011 [facility] Patient Information 2015 [place], [place]. This information is not intended to replace advice given to you by your health care provider. Make sure you discuss any questions you have with your health care provider.

## 2014-06-28 ENCOUNTER — Encounter (HOSPITAL_COMMUNITY): Payer: Self-pay | Admitting: Pharmacy Technician

## 2014-06-28 ENCOUNTER — Encounter (HOSPITAL_COMMUNITY)
Admission: RE | Admit: 2014-06-28 | Discharge: 2014-06-28 | Disposition: A | Discharge status: Home / Self Care | Payer: Medicare Other | Source: Ambulatory Visit | Attending: Ophthalmology | Admitting: Ophthalmology

## 2014-06-28 ENCOUNTER — Other Ambulatory Visit: Payer: Self-pay

## 2014-06-28 ENCOUNTER — Encounter (HOSPITAL_COMMUNITY): Payer: Self-pay

## 2014-06-28 DIAGNOSIS — H547 Unspecified visual loss: Secondary | ICD-10-CM | POA: Insufficient documentation

## 2014-06-28 DIAGNOSIS — Z01818 Encounter for other preprocedural examination: Secondary | ICD-10-CM | POA: Insufficient documentation

## 2014-06-28 LAB — BASIC METABOLIC PANEL
Anion gap: 11 (ref 5–15)
BUN: 21 mg/dL (ref 6–23)
CALCIUM: 8.7 mg/dL (ref 8.4–10.5)
CO2: 24 mEq/L (ref 19–32)
CREATININE: 1.1 mg/dL (ref 0.50–1.10)
Chloride: 107 mEq/L (ref 96–112)
GFR, EST AFRICAN AMERICAN: 57 mL/min — AB (ref >90)
GFR, EST NON AFRICAN AMERICAN: 49 mL/min — AB (ref >90)
Glucose, Bld: 142 mg/dL — ABNORMAL HIGH (ref 70–99)
Potassium: 3.9 mEq/L (ref 3.7–5.3)
Sodium: 142 mEq/L (ref 137–147)

## 2014-06-28 LAB — HEMOGLOBIN AND HEMATOCRIT, BLOOD
HEMATOCRIT: 34.4 % — AB (ref 36.0–46.0)
HEMOGLOBIN: 11.2 g/dL — AB (ref 12.0–15.0)

## 2014-06-30 NOTE — Telephone Encounter (Signed)
An apt has been scheduled for 09/28/14 with Deberah Castle, NP.

## 2014-07-03 MED ORDER — CYCLOPENTOLATE-PHENYLEPHRINE OP SOLN OPTIME - NO CHARGE
OPHTHALMIC | Status: AC
  Filled 2014-07-03: qty 2

## 2014-07-03 MED ORDER — KETOROLAC TROMETHAMINE 0.5 % OP SOLN
OPHTHALMIC | Status: AC
  Filled 2014-07-03: qty 5

## 2014-07-03 MED ORDER — PHENYLEPHRINE HCL 2.5 % OP SOLN
OPHTHALMIC | Status: AC
  Filled 2014-07-03: qty 15

## 2014-07-03 MED ORDER — TETRACAINE HCL 0.5 % OP SOLN
OPHTHALMIC | Status: AC
  Filled 2014-07-03: qty 2

## 2014-07-04 ENCOUNTER — Encounter (HOSPITAL_COMMUNITY)
Admission: RE | Disposition: A | Discharge status: Home / Self Care | Payer: Self-pay | Source: Ambulatory Visit | Attending: Ophthalmology

## 2014-07-04 ENCOUNTER — Encounter (HOSPITAL_COMMUNITY): Payer: Medicare Other | Admitting: Anesthesiology

## 2014-07-04 ENCOUNTER — Ambulatory Visit (HOSPITAL_COMMUNITY): Payer: Medicare Other | Admitting: Anesthesiology

## 2014-07-04 ENCOUNTER — Encounter (HOSPITAL_COMMUNITY): Payer: Self-pay | Admitting: *Deleted

## 2014-07-04 ENCOUNTER — Ambulatory Visit (HOSPITAL_COMMUNITY)
Admission: RE | Admit: 2014-07-04 | Discharge: 2014-07-04 | Disposition: A | Discharge status: Home / Self Care | Payer: Medicare Other | Source: Ambulatory Visit | Attending: Ophthalmology | Admitting: Ophthalmology

## 2014-07-04 DIAGNOSIS — Z7902 Long term (current) use of antithrombotics/antiplatelets: Secondary | ICD-10-CM | POA: Insufficient documentation

## 2014-07-04 DIAGNOSIS — D649 Anemia, unspecified: Secondary | ICD-10-CM | POA: Insufficient documentation

## 2014-07-04 DIAGNOSIS — H2511 Age-related nuclear cataract, right eye: Secondary | ICD-10-CM | POA: Insufficient documentation

## 2014-07-04 DIAGNOSIS — Z794 Long term (current) use of insulin: Secondary | ICD-10-CM | POA: Diagnosis not present

## 2014-07-04 DIAGNOSIS — I252 Old myocardial infarction: Secondary | ICD-10-CM | POA: Insufficient documentation

## 2014-07-04 DIAGNOSIS — E119 Type 2 diabetes mellitus without complications: Secondary | ICD-10-CM | POA: Diagnosis not present

## 2014-07-04 DIAGNOSIS — K219 Gastro-esophageal reflux disease without esophagitis: Secondary | ICD-10-CM | POA: Diagnosis not present

## 2014-07-04 DIAGNOSIS — I10 Essential (primary) hypertension: Secondary | ICD-10-CM | POA: Diagnosis not present

## 2014-07-04 DIAGNOSIS — Z955 Presence of coronary angioplasty implant and graft: Secondary | ICD-10-CM | POA: Insufficient documentation

## 2014-07-04 DIAGNOSIS — Z79899 Other long term (current) drug therapy: Secondary | ICD-10-CM | POA: Diagnosis not present

## 2014-07-04 DIAGNOSIS — E669 Obesity, unspecified: Secondary | ICD-10-CM | POA: Diagnosis not present

## 2014-07-04 DIAGNOSIS — Z7982 Long term (current) use of aspirin: Secondary | ICD-10-CM | POA: Insufficient documentation

## 2014-07-04 DIAGNOSIS — I2510 Atherosclerotic heart disease of native coronary artery without angina pectoris: Secondary | ICD-10-CM | POA: Insufficient documentation

## 2014-07-04 DIAGNOSIS — I739 Peripheral vascular disease, unspecified: Secondary | ICD-10-CM | POA: Insufficient documentation

## 2014-07-04 DIAGNOSIS — Z87891 Personal history of nicotine dependence: Secondary | ICD-10-CM | POA: Insufficient documentation

## 2014-07-04 HISTORY — PX: CATARACT EXTRACTION W/PHACO: SHX586

## 2014-07-04 SURGERY — PHACOEMULSIFICATION, CATARACT, WITH IOL INSERTION
Anesthesia: Monitor Anesthesia Care | Site: Eye | Laterality: Right

## 2014-07-04 MED ORDER — EPINEPHRINE HCL 1 MG/ML IJ SOLN
INTRAMUSCULAR | Status: AC
  Filled 2014-07-04: qty 1

## 2014-07-04 MED ORDER — BSS IO SOLN
INTRAOCULAR | Status: DC | PRN
Start: 2014-07-04 — End: 2014-07-04
  Administered 2014-07-04: 15 mL via INTRAOCULAR

## 2014-07-04 MED ORDER — EPINEPHRINE HCL 1 MG/ML IJ SOLN
INTRAOCULAR | Status: DC | PRN
  Administered 2014-07-04: 10:00:00

## 2014-07-04 MED ORDER — FENTANYL CITRATE 0.05 MG/ML IJ SOLN
INTRAMUSCULAR | Status: AC
  Filled 2014-07-04: qty 2

## 2014-07-04 MED ORDER — CYCLOPENTOLATE-PHENYLEPHRINE 0.2-1 % OP SOLN
1.0000 [drp] | OPHTHALMIC | Status: AC
  Administered 2014-07-04 (×3): 1 [drp] via OPHTHALMIC

## 2014-07-04 MED ORDER — MIDAZOLAM HCL 2 MG/2ML IJ SOLN
1.0000 mg | INTRAMUSCULAR | Status: DC | PRN
  Administered 2014-07-04: 2 mg via INTRAVENOUS

## 2014-07-04 MED ORDER — LACTATED RINGERS IV SOLN
INTRAVENOUS | Status: DC | PRN
  Administered 2014-07-04: 09:00:00 via INTRAVENOUS

## 2014-07-04 MED ORDER — FENTANYL CITRATE 0.05 MG/ML IJ SOLN
25.0000 ug | INTRAMUSCULAR | Status: AC
  Administered 2014-07-04 (×2): 25 ug via INTRAVENOUS

## 2014-07-04 MED ORDER — TETRACAINE HCL 0.5 % OP SOLN
1.0000 [drp] | OPHTHALMIC | Status: AC
  Administered 2014-07-04 (×3): 1 [drp] via OPHTHALMIC

## 2014-07-04 MED ORDER — PHENYLEPHRINE HCL 2.5 % OP SOLN
1.0000 [drp] | OPHTHALMIC | Status: AC
  Administered 2014-07-04 (×3): 1 [drp] via OPHTHALMIC

## 2014-07-04 MED ORDER — TETRACAINE 0.5 % OP SOLN OPTIME - NO CHARGE
OPHTHALMIC | Status: DC | PRN
  Administered 2014-07-04: 2 [drp] via OPHTHALMIC

## 2014-07-04 MED ORDER — PROVISC 10 MG/ML IO SOLN
INTRAOCULAR | Status: DC | PRN
  Administered 2014-07-04: 0.85 mL via INTRAOCULAR

## 2014-07-04 MED ORDER — KETOROLAC TROMETHAMINE 0.5 % OP SOLN
1.0000 [drp] | OPHTHALMIC | Status: AC
  Administered 2014-07-04 (×3): 1 [drp] via OPHTHALMIC

## 2014-07-04 MED ORDER — MIDAZOLAM HCL 2 MG/2ML IJ SOLN
INTRAMUSCULAR | Status: AC
  Filled 2014-07-04: qty 2

## 2014-07-04 MED ORDER — LACTATED RINGERS IV SOLN
INTRAVENOUS | Status: DC
  Administered 2014-07-04: 10:00:00 via INTRAVENOUS

## 2014-07-04 SURGICAL SUPPLY — 24 items
CAPSULAR TENSION RING-AMO (OPHTHALMIC RELATED) IMPLANT
CLOTH BEACON ORANGE TIMEOUT ST (SAFETY) ×2 IMPLANT
EYE SHIELD UNIVERSAL CLEAR (GAUZE/BANDAGES/DRESSINGS) ×2 IMPLANT
GLOVE BIO SURGEON STRL SZ 6.5 (GLOVE) ×1 IMPLANT
GLOVE BIO SURGEONS STRL SZ 6.5 (GLOVE) ×1
GLOVE ECLIPSE 6.5 STRL STRAW (GLOVE) IMPLANT
GLOVE ECLIPSE 7.0 STRL STRAW (GLOVE) IMPLANT
GLOVE EXAM NITRILE LRG STRL (GLOVE) IMPLANT
GLOVE EXAM NITRILE MD LF STRL (GLOVE) ×2 IMPLANT
GLOVE SKINSENSE NS SZ6.5 (GLOVE)
GLOVE SKINSENSE STRL SZ6.5 (GLOVE) IMPLANT
HEALON 5 0.6 ML (INTRAOCULAR LENS) IMPLANT
KIT VITRECTOMY (OPHTHALMIC RELATED) IMPLANT
PAD ARMBOARD 7.5X6 YLW CONV (MISCELLANEOUS) ×2 IMPLANT
PROC W NO LENS (INTRAOCULAR LENS)
PROC W SPEC LENS (INTRAOCULAR LENS)
PROCESS W NO LENS (INTRAOCULAR LENS) IMPLANT
PROCESS W SPEC LENS (INTRAOCULAR LENS) IMPLANT
RETRACTOR IRIS SIGHTPATH (OPHTHALMIC RELATED) IMPLANT
RING MALYGIN (MISCELLANEOUS) IMPLANT
SIGHTPATH CAT PROC W REG LENS (Ophthalmic Related) ×3 IMPLANT
TAPE PAPER 2X10 WHT MICROPORE (GAUZE/BANDAGES/DRESSINGS) ×2 IMPLANT
VISCOELASTIC ADDITIONAL (OPHTHALMIC RELATED) IMPLANT
WATER STERILE IRR 250ML POUR (IV SOLUTION) ×2 IMPLANT

## 2014-07-04 NOTE — Transfer of Care (Signed)
Immediate Anesthesia Transfer of Care Note  Patient: [name]  Procedure(s) Performed: Procedure(s) with comments: CATARACT EXTRACTION PHACO AND INTRAOCULAR LENS PLACEMENT (IOC) (Right) - CDE:13.13  Patient Location: Short Stay  Anesthesia Type:MAC  Level of Consciousness: awake, alert , oriented and patient cooperative  Airway & Oxygen Therapy: Patient Spontanous Breathing  Post-op Assessment: Report given to PACU RN and Post -op Vital signs reviewed and stable  Post vital signs: Reviewed and stable  Complications: No apparent anesthesia complications

## 2014-07-04 NOTE — Op Note (Signed)
Patient brought to the operating room and prepped and draped in the usual manner.  Lid speculum inserted in right eye.  Stab incision made at the twelve o'clock position.  Provisc instilled in the anterior chamber.   A 2.4 mm. Stab incision was made temporally.  An anterior capsulotomy was done with a bent 25 gauge needle.  The nucleus was hydrodissected.  The Phaco tip was inserted in the anterior chamber and the nucleus was emulsified.  CDE was 13.13.  The cortical material was then removed with the I and A tip.  Posterior capsule was the polished.  The anterior chamber was deepened with Provisc.  A 22.5 Alcon [id number] IOL was then inserted in the capsular bag.  Provisc was then removed with the I and A tip.  The wound was then hydrated.  Patient sent to the Recovery Room in good condition with follow up in my office.  Preoperative Diagnosis:  Nuclear Cataract OD Postoperative Diagnosis:  Same Procedure name: Kelman Phacoemulsification OD with IOL

## 2014-07-04 NOTE — Anesthesia Postprocedure Evaluation (Signed)
  Anesthesia Post-op Note  Patient: [name]  Procedure(s) Performed: Procedure(s) with comments: CATARACT EXTRACTION PHACO AND INTRAOCULAR LENS PLACEMENT (IOC) (Right) - CDE:13.13  Patient Location: Short Stay  Anesthesia Type:MAC  Level of Consciousness: awake, alert , oriented and patient cooperative  Airway and Oxygen Therapy: Patient Spontanous Breathing  Post-op Pain: none  Post-op Assessment: Post-op Vital signs reviewed, Patient's Cardiovascular Status Stable, Respiratory Function Stable, Patent Airway, No signs of Nausea or vomiting and Pain level controlled  Post-op Vital Signs: Reviewed and stable  Last Vitals:  Filed Vitals:   07/04/14 0950  BP: 112/47  Pulse:   Temp:   Resp: 14    Complications: No apparent anesthesia complications

## 2014-07-04 NOTE — H&P (Signed)
The patient was re examined and there is no change in the patients condition since the original H and P. 

## 2014-07-04 NOTE — Anesthesia Procedure Notes (Signed)
Procedure Name: MAC Date/Time: 07/04/2014 9:54 AM Performed by: Andree Elk, AMY A Pre-anesthesia Checklist: Patient identified, Timeout performed, Emergency Drugs available, Suction available and Patient being monitored Oxygen Delivery Method: Nasal cannula

## 2014-07-04 NOTE — Anesthesia Preprocedure Evaluation (Signed)
Anesthesia Evaluation  Patient identified by MRN, date of birth, ID band Patient awake    Reviewed: Allergy & Precautions, H&P , NPO status , Patient's Chart, lab work & pertinent test results, reviewed documented beta blocker date and time   Airway Mallampati: II  TM Distance: >3 FB Neck ROM: full    Dental   Pulmonary sleep apnea , former smoker,  breath sounds clear to auscultation        Cardiovascular hypertension, On Medications, Pt. on medications and Pt. on home beta blockers + CAD, + Past MI, + Cardiac Stents and + Peripheral Vascular Disease negative cardio ROS  + Valvular Problems/Murmurs AI Rhythm:regular     Neuro/Psych CVA negative neurological ROS  negative psych ROS   GI/Hepatic Neg liver ROS, hiatal hernia, GERD-  Medicated and Controlled,  Endo/Other  diabetes, Type 2, Insulin DependentMorbid obesity  Renal/GU Renal diseasenegative Renal ROS  negative genitourinary   Musculoskeletal   Abdominal   Peds  Hematology  (+) anemia ,   Anesthesia Other Findings See surgeon's H&P   Reproductive/Obstetrics negative OB ROS                             Anesthesia Physical Anesthesia Plan  ASA: III  Anesthesia Plan: MAC   Post-op Pain Management:    Induction: Intravenous  Airway Management Planned: Nasal Cannula  Additional Equipment:   Intra-op Plan:   Post-operative Plan:   Informed Consent: I have reviewed the patients History and Physical, chart, labs and discussed the procedure including the risks, benefits and alternatives for the proposed anesthesia with the patient or authorized representative who has indicated his/her understanding and acceptance.     Plan Discussed with:   Anesthesia Plan Comments:         Anesthesia Quick Evaluation

## 2014-07-04 NOTE — Discharge Instructions (Signed)
[name]  07/04/2014           [facility] Instructions [place] [address]      1. Avoid closing eyes tightly. One often closes the eye tightly when laughing, talking, sneezing, coughing or if they feel irritated. At these times, you should be careful not to close your eyes tightly.  2. Instill eye drops as instructed. To instill drops in your eye, open it, look up and have someone gently pull the lower lid down and instill a couple of drops inside the lower lid.  3. Do not touch upper lid.  4. Take Advil or Tylenol for pain.  5. You may use either eye for near work, such as reading or sewing and you may watch television.  6. You may have your hair done at the beauty parlor at any time.  7. Wear dark glasses with or without your own glasses if you are in bright light.  8. Call our office at [phone] or [phone] if you have sharp pain in your eye or unusual symptoms.  9. Do not be concerned because vision in the operative eye is not good. It will not be good, no matter how successful the operation, until you get a special lens for it. Your old glasses will not be suited to the new eye that was operated on and you will not be ready for a new lens for about a month.  10. Follow up at the [facility] office. 2:00 - 3:00 PM    I have received a copy of the above instructions and will follow them.      PATIENT INSTRUCTIONS POST-ANESTHESIA  IMMEDIATELY FOLLOWING SURGERY:  Do not drive or operate machinery for the first twenty four hours after surgery.  Do not make any important decisions for twenty four hours after surgery or while taking narcotic pain medications or sedatives.  If you develop intractable nausea and vomiting or a severe headache please notify your doctor immediately.  FOLLOW-UP:  Please make an appointment with your surgeon as instructed. You do not need to follow up with anesthesia unless specifically  instructed to do so.  WOUND CARE INSTRUCTIONS (if applicable):  Keep a dry clean dressing on the anesthesia/puncture wound site if there is drainage.  Once the wound has quit draining you may leave it open to air.  Generally you should leave the bandage intact for twenty four hours unless there is drainage.  If the epidural site drains for more than 36-48 hours please call the anesthesia department.  QUESTIONS?:  Please feel free to call your physician or the hospital operator if you have any questions, and they will be happy to assist you.

## 2014-07-05 ENCOUNTER — Telehealth (HOSPITAL_COMMUNITY): Payer: Self-pay | Admitting: *Deleted

## 2014-07-05 ENCOUNTER — Encounter (HOSPITAL_COMMUNITY): Payer: Self-pay | Admitting: Ophthalmology

## 2014-07-05 LAB — GLUCOSE, CAPILLARY: Glucose-Capillary: 107 mg/dL — ABNORMAL HIGH (ref 70–99)

## 2014-07-09 IMAGING — NM NM MYOCAR SINGLE W/SPECT W/WALL MOTION & EF
2 series · 12 of 12 positions shown · non-contrast
Comparison: None.

CLINICAL DATA: The patient is a 72-year-old woman with a history of
coronary artery disease and prior stenting of the RCA. She has been
experiencing lightheadedness and a rapid heart rate. She is referred
for an ischemic evaluation.

EXAM:
MYOCARDIAL IMAGING WITH SPECT (REST AND PHARMACOLOGIC-STRESS)
GATED LEFT VENTRICULAR WALL MOTION STUDY
LEFT VENTRICULAR EJECTION FRACTION
TECHNIQUE: Standard myocardial SPECT imaging was performed after resting
intravenous injection of 10 mCi Bc-99m sestamibi. Subsequently,
intravenous infusion of Lexiscan was performed under the supervision
of the Cardiology staff. At peak effect of the drug, 30 mCi Bc-99m
sestamibi was injected intravenously and standard myocardial SPECT
imaging was performed. Quantitative gated imaging was also performed
to evaluate left ventricular wall motion, and estimate left
ventricular ejection fraction.

[cr cardiac tc low dose · 6.41mm/px · 6 of 64 frames shown]
[frame 6/64]
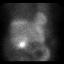
[frame 16/64]
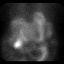
[frame 27/64]
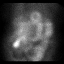
[frame 38/64]
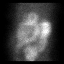
[frame 48/64]
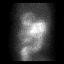
[frame 59/64]
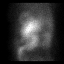

[cs cardiac tc hi dose · 6.41mm/px · 6 of 512 frames shown]
[frame 43/512]
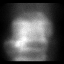
[frame 128/512]
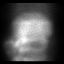
[frame 214/512]
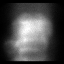
[frame 299/512]
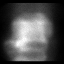
[frame 384/512]
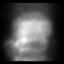
[frame 470/512]
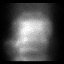

[12 of 12 positions shown; findings below may reference images not displayed]

FINDINGS: The patient was stressed according to the Lexiscan protocol. The
heart rate ranged from 65 beats per min up to 107 beats per min. The
blood pressure ranged from 124/59 up to 146/56. No chest pain was
reported.

The resting ECG showed normal sinus rhythm. There were no diagnostic
ST segment or T-wave abnormalities with stress. No arrhythmias were
seen.

Analysis of the raw data showed no significant extracardiac
radiotracer uptake. Analysis of the perfusion images showed a small
size, mild to moderately intense, apical anteroseptal defect on
stress images. There was a small size, mild to moderately intense,
inferior wall defect seen on rest images. Overall, images were worse
on rest than stress. Regional wall motion was normal, indicating
that the aforementioned defects were likely due to soft tissue
attenuation. Left ventricular systolic function was normal,
calculated LVEF 69%.
IMPRESSION: 1.  Normal Lexiscan Cardiolite stress test.

2.  No evidence of ischemia or scar.

3.  Normal left ventricular systolic function, calculated LVEF 69%.

4.  No chest pain was reported.

## 2014-07-27 ENCOUNTER — Ambulatory Visit (INDEPENDENT_AMBULATORY_CARE_PROVIDER_SITE_OTHER): Payer: Medicare Other | Admitting: Otolaryngology

## 2014-07-27 DIAGNOSIS — H7203 Central perforation of tympanic membrane, bilateral: Secondary | ICD-10-CM

## 2014-07-27 DIAGNOSIS — H6123 Impacted cerumen, bilateral: Secondary | ICD-10-CM

## 2014-08-25 ENCOUNTER — Encounter (INDEPENDENT_AMBULATORY_CARE_PROVIDER_SITE_OTHER): Payer: Self-pay

## 2014-09-03 ENCOUNTER — Emergency Department (HOSPITAL_COMMUNITY): Payer: Medicare Other

## 2014-09-03 ENCOUNTER — Encounter (HOSPITAL_COMMUNITY): Payer: Self-pay | Admitting: Emergency Medicine

## 2014-09-03 ENCOUNTER — Emergency Department (HOSPITAL_COMMUNITY)
Admission: EM | Admit: 2014-09-03 | Discharge: 2014-09-03 | Disposition: A | Discharge status: Home / Self Care | Payer: Medicare Other | Attending: Emergency Medicine | Admitting: Emergency Medicine

## 2014-09-03 DIAGNOSIS — K219 Gastro-esophageal reflux disease without esophagitis: Secondary | ICD-10-CM | POA: Insufficient documentation

## 2014-09-03 DIAGNOSIS — Z79899 Other long term (current) drug therapy: Secondary | ICD-10-CM | POA: Insufficient documentation

## 2014-09-03 DIAGNOSIS — Z792 Long term (current) use of antibiotics: Secondary | ICD-10-CM | POA: Diagnosis not present

## 2014-09-03 DIAGNOSIS — Z7902 Long term (current) use of antithrombotics/antiplatelets: Secondary | ICD-10-CM | POA: Insufficient documentation

## 2014-09-03 DIAGNOSIS — Z794 Long term (current) use of insulin: Secondary | ICD-10-CM | POA: Insufficient documentation

## 2014-09-03 DIAGNOSIS — Z862 Personal history of diseases of the blood and blood-forming organs and certain disorders involving the immune mechanism: Secondary | ICD-10-CM | POA: Diagnosis not present

## 2014-09-03 DIAGNOSIS — E119 Type 2 diabetes mellitus without complications: Secondary | ICD-10-CM | POA: Insufficient documentation

## 2014-09-03 DIAGNOSIS — R079 Chest pain, unspecified: Secondary | ICD-10-CM | POA: Insufficient documentation

## 2014-09-03 DIAGNOSIS — G4733 Obstructive sleep apnea (adult) (pediatric): Secondary | ICD-10-CM | POA: Insufficient documentation

## 2014-09-03 DIAGNOSIS — I2510 Atherosclerotic heart disease of native coronary artery without angina pectoris: Secondary | ICD-10-CM | POA: Insufficient documentation

## 2014-09-03 DIAGNOSIS — Z8673 Personal history of transient ischemic attack (TIA), and cerebral infarction without residual deficits: Secondary | ICD-10-CM | POA: Diagnosis not present

## 2014-09-03 DIAGNOSIS — I10 Essential (primary) hypertension: Secondary | ICD-10-CM | POA: Insufficient documentation

## 2014-09-03 DIAGNOSIS — Z87440 Personal history of urinary (tract) infections: Secondary | ICD-10-CM | POA: Diagnosis not present

## 2014-09-03 DIAGNOSIS — R0602 Shortness of breath: Secondary | ICD-10-CM | POA: Insufficient documentation

## 2014-09-03 DIAGNOSIS — R110 Nausea: Secondary | ICD-10-CM | POA: Diagnosis not present

## 2014-09-03 DIAGNOSIS — Z9981 Dependence on supplemental oxygen: Secondary | ICD-10-CM | POA: Diagnosis not present

## 2014-09-03 DIAGNOSIS — I252 Old myocardial infarction: Secondary | ICD-10-CM | POA: Diagnosis not present

## 2014-09-03 DIAGNOSIS — E780 Pure hypercholesterolemia: Secondary | ICD-10-CM | POA: Diagnosis not present

## 2014-09-03 LAB — CBC WITH DIFFERENTIAL/PLATELET
Basophils Absolute: 0 10*3/uL (ref 0.0–0.1)
Basophils Relative: 0 % (ref 0–1)
Eosinophils Absolute: 0.2 10*3/uL (ref 0.0–0.7)
Eosinophils Relative: 2 % (ref 0–5)
HEMATOCRIT: 36.3 % (ref 36.0–46.0)
HEMOGLOBIN: 11.7 g/dL — AB (ref 12.0–15.0)
Lymphocytes Relative: 14 % (ref 12–46)
Lymphs Abs: 1.1 10*3/uL (ref 0.7–4.0)
MCH: 29 pg (ref 26.0–34.0)
MCHC: 32.2 g/dL (ref 30.0–36.0)
MCV: 90.1 fL (ref 78.0–100.0)
MONO ABS: 0.6 10*3/uL (ref 0.1–1.0)
MONOS PCT: 7 % (ref 3–12)
NEUTROS ABS: 5.8 10*3/uL (ref 1.7–7.7)
NEUTROS PCT: 77 % (ref 43–77)
Platelets: 130 10*3/uL — ABNORMAL LOW (ref 150–400)
RBC: 4.03 MIL/uL (ref 3.87–5.11)
RDW: 14.2 % (ref 11.5–15.5)
WBC: 7.6 10*3/uL (ref 4.0–10.5)

## 2014-09-03 LAB — COMPREHENSIVE METABOLIC PANEL
ALK PHOS: 109 U/L (ref 39–117)
ALT: 16 U/L (ref 0–35)
AST: 22 U/L (ref 0–37)
Albumin: 3.8 g/dL (ref 3.5–5.2)
Anion gap: 6 (ref 5–15)
BUN: 21 mg/dL (ref 6–23)
CO2: 25 mmol/L (ref 19–32)
CREATININE: 1.23 mg/dL — AB (ref 0.50–1.10)
Calcium: 8.9 mg/dL (ref 8.4–10.5)
Chloride: 106 mEq/L (ref 96–112)
GFR calc Af Amer: 50 mL/min — ABNORMAL LOW (ref >90)
GFR, EST NON AFRICAN AMERICAN: 43 mL/min — AB (ref >90)
Glucose, Bld: 150 mg/dL — ABNORMAL HIGH (ref 70–99)
POTASSIUM: 4.6 mmol/L (ref 3.5–5.1)
Sodium: 137 mmol/L (ref 135–145)
Total Bilirubin: 0.5 mg/dL (ref 0.3–1.2)
Total Protein: 6.6 g/dL (ref 6.0–8.3)

## 2014-09-03 LAB — TROPONIN I

## 2014-09-03 MED ORDER — PANTOPRAZOLE SODIUM 40 MG IV SOLR
40.0000 mg | Freq: Once | INTRAVENOUS | Status: AC
  Administered 2014-09-03: 40 mg via INTRAVENOUS
  Filled 2014-09-03: qty 40

## 2014-09-03 NOTE — ED Provider Notes (Signed)
CSN: [id number]     Arrival date & time 09/03/14  1142 History  This chart was scribed for Maudry Diego, MD by Stephania Fragmin, ED Scribe. This patient was seen in room APA08/AP[facility]08 and the patient's care was started at 1:13 PM.    Chief Complaint  Patient presents with  . Chest Pain   Patient is a 73 y.o. female presenting with chest pain. The history is provided by the patient. No language interpreter was used.  Chest Pain Pain location:  Substernal area Pain radiates to:  Does not radiate Pain radiates to the back: no   Duration:  2 days Timing:  Intermittent Chronicity:  New Relieved by:  Aspirin Worsened by:  Nothing tried Ineffective treatments:  None tried Associated symptoms: nausea and shortness of breath   Associated symptoms: no abdominal pain, no back pain, no cough, no fatigue and no headache   Risk factors: coronary artery disease     HPI Comments: [name] is a 73 y.o. female who presents to the Emergency Department complaining of intermittent central chest pain that began 2 days ago. She reports that she is not currently experiencing pain, as her last episode of chest pain stopped 30 minutes ago. Patient states that today's pain is less severe than her pain during her last MI in 1994. Patient reports a history of stents. She states that her last catheterization stress test was done 6 months ago. She denies daily aspirin use, although she took one today for her pain. Patient also took Plavix in the morning.  Past Medical History  Diagnosis Date  . Back pain   . GERD (gastroesophageal reflux disease)   . MI (myocardial infarction)   . Hypercholesteremia   . Anemia   . Cirrhosis   . Aortic sclerosis 07/04/2011  . Aortic insufficiency 07/04/2011    most recent 2D Echo 02/24/2012  EF greater than 55%  . Carotid stenosis, bilateral 07/04/2011    most recent 05/2010 carotid doppler  . Presence of stent in right coronary artery 07/04/2011  . HTN (hypertension)  07/04/2011  . Coronary artery disease   . Myocardial infarction 1995  . GERD (gastroesophageal reflux disease)   . Non-alcoholic fatty liver disease   . UTI (lower urinary tract infection)     chronic/recurring/on bactrim  . CVA (cerebral infarction)     03/07/2014  . Hiatal hernia   . IBS (irritable bowel syndrome)   . Diabetes mellitus 2005  . Varices, esophageal   . Stroke 03/07/2014    no deficits from stroke  . OSA (obstructive sleep apnea) 07/04/2011    sleep study 2009  AHI 9.91/hr and during REM sleep 35.58/hr   Past Surgical History  Procedure Laterality Date  . Back surgery  1985  . Tonsillectomy    . Appendectomy    . Colonoscopy    . Eye surgery  08    cataract surgery of the left eye  . Ercp    . Myringotomy  2012    both ears  . Hardware removal Right 01/17/2013    Procedure: REMOVAL OF HARDWARE AND EXCISION ULNAR STYLOID RIGHT WRIST;  Surgeon: Tennis Must, MD;  Location: [facility];  Service: Orthopedics;  Laterality: Right;  . Wrist surgery      rt wrist hardwear removal  . Esophagogastroduodenoscopy N/A 04/01/2013    Procedure: ESOPHAGOGASTRODUODENOSCOPY (EGD);  Surgeon: Rogene Houston, MD;  Location: [facility];  Service: Endoscopy;  Laterality: N/A;  230-rescheduled to  8:30am Ann notified pt  . Esophageal banding N/A 04/01/2013    Procedure: ESOPHAGEAL BANDING;  Surgeon: Rogene Houston, MD;  Location: [facility];  Service: Endoscopy;  Laterality: N/A;  . Esophagogastroduodenoscopy N/A 05/24/2013    Procedure: ESOPHAGOGASTRODUODENOSCOPY (EGD);  Surgeon: Rogene Houston, MD;  [facility]: [facility];  Service: Endoscopy;  Laterality: N/A;  730  . Esophageal banding N/A 05/24/2013    Procedure: ESOPHAGEAL BANDING;  Surgeon: Rogene Houston, MD;  [facility]: [facility];  Service: Endoscopy;  Laterality: N/A;  . Dilation and curettage of uterus      x2  . Vaginal hysterectomy  1972  . Anal fissure repair  1972  . Epi retinal membrane peel  Left   . Esophagogastroduodenoscopy N/A 06/21/2014    Procedure: ESOPHAGOGASTRODUODENOSCOPY (EGD);  Surgeon: Rogene Houston, MD;  [facility]: [facility];  Service: Endoscopy;  Laterality: N/A;  930-rescheduled 10/14 @ 1200 Ann to notify pt  . Esophageal banding N/A 06/21/2014    Procedure: ESOPHAGEAL BANDING;  Surgeon: Rogene Houston, MD;  [facility]: [facility];  Service: Endoscopy;  Laterality: N/A;  . Cardiac catheterization  1995.,2006,1997    stent to the proximal RCA after MI   . Cataract extraction w/phaco Right 07/04/2014    Procedure: CATARACT EXTRACTION PHACO AND INTRAOCULAR LENS PLACEMENT (IOC);  Surgeon: Elta Guadeloupe T. Gershon Crane, MD;  Location: [facility];  Service: Ophthalmology;  Laterality: Right;  CDE:13.13   Family History  Problem Relation Age of Onset  . Diabetes Mother   . Heart failure Father    History  Substance Use Topics  . Smoking status: Former Smoker -- 0.25 packs/day for 50 years    Types: Cigarettes    Quit date: 01/13/2012  . Smokeless tobacco: Never Used  . Alcohol Use: No   OB History    No data available     Review of Systems  Constitutional: Negative for appetite change and fatigue.  HENT: Negative for congestion, ear discharge and sinus pressure.   Eyes: Negative for discharge.  Respiratory: Positive for shortness of breath. Negative for cough.   Cardiovascular: Positive for chest pain.  Gastrointestinal: Positive for nausea. Negative for abdominal pain and diarrhea.  Genitourinary: Negative for frequency and hematuria.  Musculoskeletal: Negative for back pain.  Skin: Negative for rash.  Neurological: Negative for seizures and headaches.  Psychiatric/Behavioral: Negative for hallucinations.  All other systems reviewed and are negative.     Allergies  Tape  Home Medications   Prior to Admission medications   Medication Sig Start Date End Date Taking? Authorizing Provider  aspirin 81 MG tablet Take 81 mg by mouth daily.   Yes Historical  Provider, MD  carvedilol (COREG) 12.5 MG tablet Take 1 tablet by mouth 2 (two) times daily. 08/18/14  Yes Historical Provider, MD  clopidogrel (PLAVIX) 75 MG tablet Take 75 mg by mouth daily. 06/05/14  Yes Historical Provider, MD  HUMULIN N KWIKPEN 100 UNIT/ML Kiwkpen Inject 60 Units into the skin 2 (two) times daily. 07/11/14  Yes Historical Provider, MD  insulin glargine (LANTUS SOLOSTAR) 100 UNIT/ML injection Inject 60 Units into the skin at bedtime.    Yes Historical Provider, MD  isosorbide mononitrate (IMDUR) 30 MG 24 hr tablet Take 30 mg by mouth daily.   Yes Historical Provider, MD  omeprazole (PRILOSEC OTC) 20 MG tablet Take 20 mg by mouth daily.   Yes Historical Provider, MD  rosuvastatin (CRESTOR) 40 MG tablet Take 1 tablet (40 mg total) by mouth  at bedtime. 07/29/13  Yes Pixie Casino, MD  sulfamethoxazole-trimethoprim (BACTRIM DS,SEPTRA DS) 800-160 MG per tablet Take 1 tablet by mouth daily. 07/18/14  Yes Historical Provider, MD  zolpidem (AMBIEN) 10 MG tablet Take 1 tablet by mouth at bedtime. 08/15/14  Yes Historical Provider, MD   BP 111/48 mmHg  Pulse 64  Temp(Src) 98.6 F (37 C) (Oral)  Resp 24  Ht 5' 3"  (1.6 m)  Wt 180 lb (81.647 kg)  BMI 31.89 kg/m2  SpO2 96% Physical Exam  Constitutional: She is oriented to person, place, and time. She appears well-developed.  Otherwise normal.  HENT:  Head: Normocephalic.  Eyes: Conjunctivae and EOM are normal. No scleral icterus.  Neck: Neck supple. No thyromegaly present.  Cardiovascular: Normal rate and regular rhythm.  Exam reveals no gallop and no friction rub.   No murmur heard. Pulmonary/Chest: No stridor. She has no wheezes. She has no rales. She exhibits no tenderness.  Abdominal: She exhibits no distension. There is no tenderness. There is no rebound.  Musculoskeletal: Normal range of motion. She exhibits no edema.  Lymphadenopathy:    She has no cervical adenopathy.  Neurological: She is oriented to person, place,  and time. She exhibits normal muscle tone. Coordination normal.  Skin: No rash noted. No erythema.  Psychiatric: She has a normal mood and affect. Her behavior is normal.  Nursing note and vitals reviewed.   ED Course  Procedures (including critical care time)  DIAGNOSTIC STUDIES: Oxygen Saturation is 92% on room air, adequate by my interpretation.    COORDINATION OF CARE: 1:15 PM - Discussed treatment plan with pt at bedside which includes tests and pt agreed to plan.   Labs Review Labs Reviewed  CBC WITH DIFFERENTIAL - Abnormal; Notable for the following:    Hemoglobin 11.7 (*)    Platelets 130 (*)    All other components within normal limits  TROPONIN I  COMPREHENSIVE METABOLIC PANEL    Imaging Review Dg Chest 2 View  09/03/2014   CLINICAL DATA:  Acute chest pain for 2 days  EXAM: CHEST  2 VIEW  COMPARISON:  03/06/2014  FINDINGS: The heart size and mediastinal contours are within normal limits. Both lungs are clear. The visualized skeletal structures are unremarkable. Lower thoracic spondylosis and degenerative change noted.  IMPRESSION: No active cardiopulmonary disease.   Electronically Signed   By: Daryll Brod M.D.   On: 09/03/2014 12:47     EKG Interpretation   Date/Time:  Sunday September 03 2014 11:49:43 EST Ventricular Rate:  64 PR Interval:  200 QRS Duration: 90 QT Interval:  448 QTC Calculation: 462 R Axis:   -27 Text Interpretation:  Normal sinus rhythm Normal ECG No significant change  since last tracing Confirmed by [place] ([id number]) on 09/03/2014  12:43:15 PM      MDM   Final diagnoses:  Chest pain    Nl troponin x 2 and pt without chest pain.  Nl ekg.    Pt will be discharged to follow up this week.  Maudry Diego, MD 09/03/14 [phone]

## 2014-09-03 NOTE — Discharge Instructions (Signed)
Follow up with your md or your cardiologist this week.  Return if problems.  Rest today

## 2014-09-03 NOTE — ED Notes (Signed)
12 lead EKG performed in Triage and given to Dr. Christy Gentles.

## 2014-09-03 NOTE — ED Notes (Addendum)
PT c/o central chest pain with some SOB and nausea x2 days. PT had 4 asa 21m this am but denies any nitroglycerin.

## 2014-09-05 ENCOUNTER — Other Ambulatory Visit (INDEPENDENT_AMBULATORY_CARE_PROVIDER_SITE_OTHER): Payer: Self-pay | Admitting: *Deleted

## 2014-09-05 ENCOUNTER — Encounter (INDEPENDENT_AMBULATORY_CARE_PROVIDER_SITE_OTHER): Payer: Self-pay | Admitting: *Deleted

## 2014-09-05 DIAGNOSIS — K7460 Unspecified cirrhosis of liver: Secondary | ICD-10-CM

## 2014-09-17 ENCOUNTER — Other Ambulatory Visit: Payer: Self-pay | Admitting: Cardiology

## 2014-09-18 DIAGNOSIS — E1342 Other specified diabetes mellitus with diabetic polyneuropathy: Secondary | ICD-10-CM | POA: Diagnosis not present

## 2014-09-18 DIAGNOSIS — B351 Tinea unguium: Secondary | ICD-10-CM | POA: Diagnosis not present

## 2014-09-18 DIAGNOSIS — L851 Acquired keratosis [keratoderma] palmaris et plantaris: Secondary | ICD-10-CM | POA: Diagnosis not present

## 2014-09-18 DIAGNOSIS — M79674 Pain in right toe(s): Secondary | ICD-10-CM | POA: Diagnosis not present

## 2014-09-19 NOTE — Telephone Encounter (Signed)
Rx(s) sent to pharmacy electronically.  

## 2014-09-26 DIAGNOSIS — K7460 Unspecified cirrhosis of liver: Secondary | ICD-10-CM | POA: Diagnosis not present

## 2014-09-26 LAB — CBC
HCT: 36.2 % (ref 36.0–46.0)
Hemoglobin: 12.1 g/dL (ref 12.0–15.0)
MCH: 29.5 pg (ref 26.0–34.0)
MCHC: 33.4 g/dL (ref 30.0–36.0)
MCV: 88.3 fL (ref 78.0–100.0)
MPV: 9.7 fL (ref 8.6–12.4)
PLATELETS: 128 10*3/uL — AB (ref 150–400)
RBC: 4.1 MIL/uL (ref 3.87–5.11)
RDW: 14.6 % (ref 11.5–15.5)
WBC: 5.9 10*3/uL (ref 4.0–10.5)

## 2014-09-27 LAB — AFP TUMOR MARKER: AFP TUMOR MARKER: 8 ng/mL — AB (ref <6.1)

## 2014-09-27 IMAGING — CT CT HEAD W/O CM
1 series · 15 of 30 positions shown, 19 images · non-contrast
Comparison: Noncontrast CT scan of the brain February 24, 2012.

ADDENDUM:
The small amount of increased density in the sylvian fissure is
consistent with intraluminal thrombus in light of the subsequent MRI
findings. It is not now felt to reflect acute blood.
CLINICAL DATA: Status post multiple falls; left-sided facial droop

EXAM:
CT HEAD WITHOUT CONTRAST
TECHNIQUE: Contiguous axial images were obtained from the base of the skull
through the vertex without intravenous contrast.

[Series 2: headtrauma 4.8 h37s · axial · 0.43mm/px · z∈[+84,+247]mm · 15 of 36 slices shown, 19 images]
[im 2/36  brain]
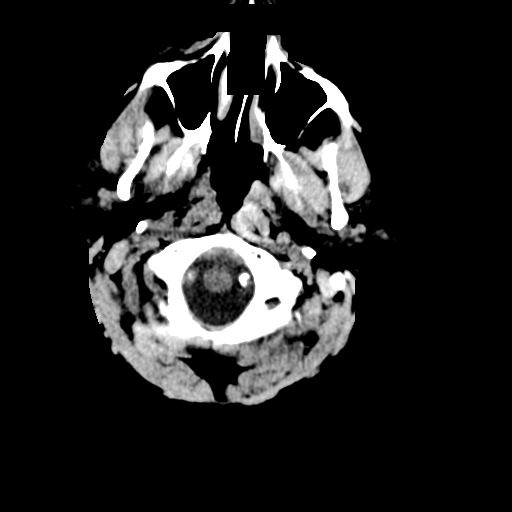
[im 2/36  bone]
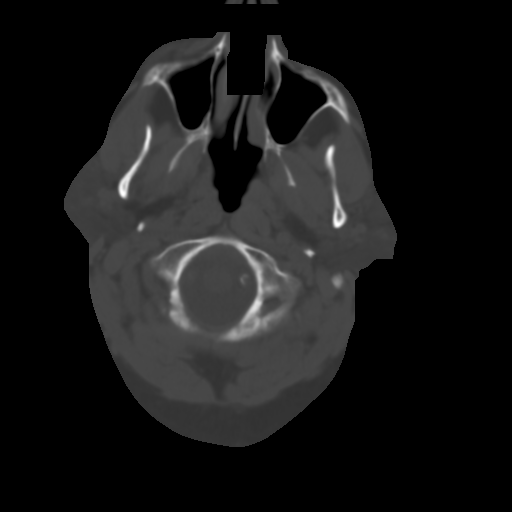
[im 4/36  brain]
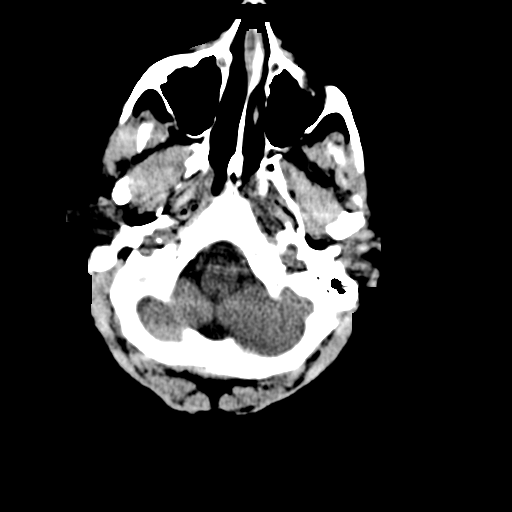
[im 7/36  brain]
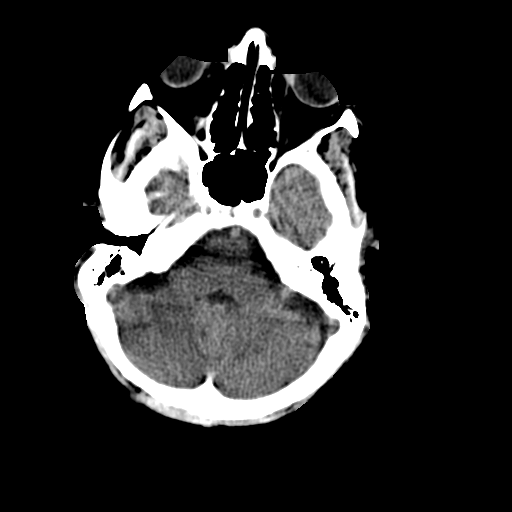
[im 9/36  brain]
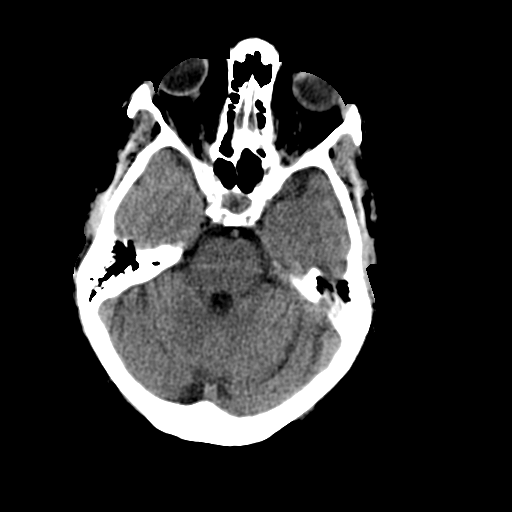
[im 11/36  brain]
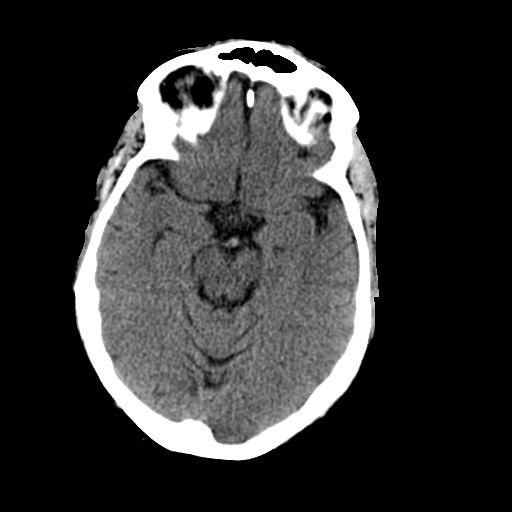
[im 11/36  bone]
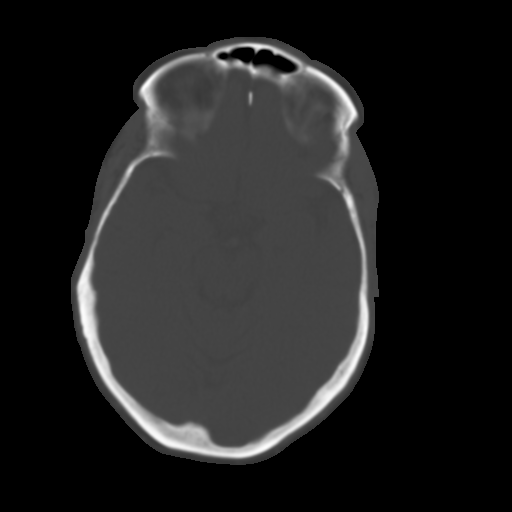
[im 14/36  brain]
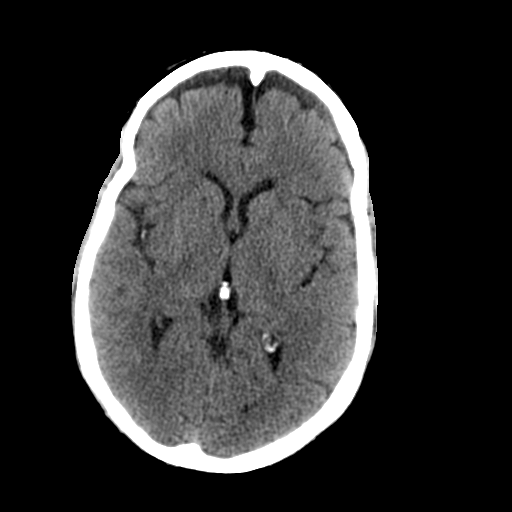
[im 16/36  brain]
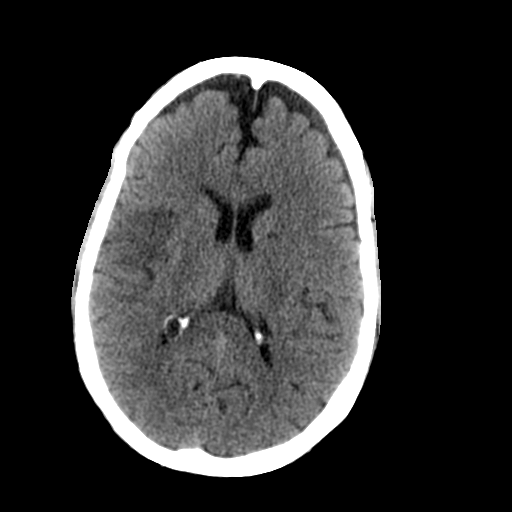
[im 19/36  brain]
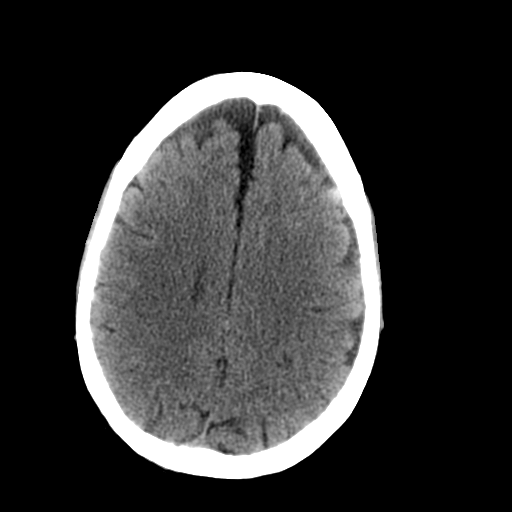
[im 20/36  brain]
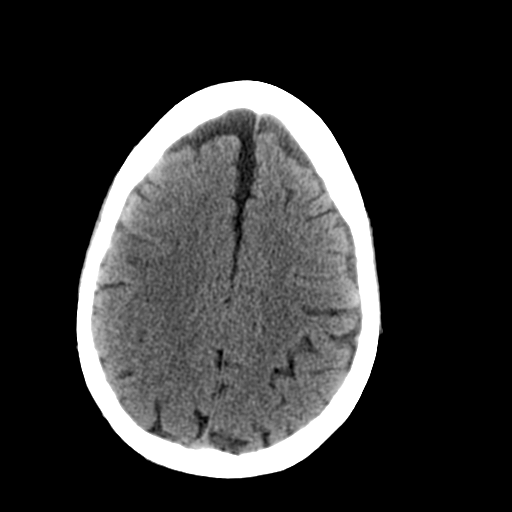
[im 20/36  bone]
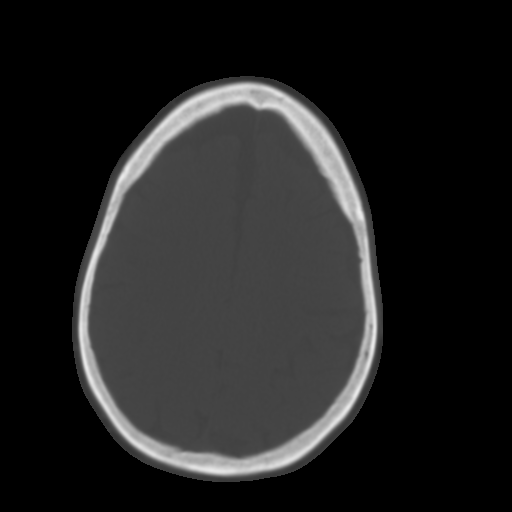
[im 22/36  brain]
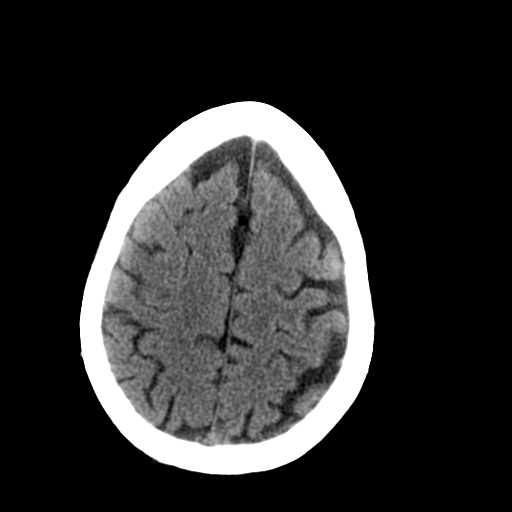
[im 25/36  brain]
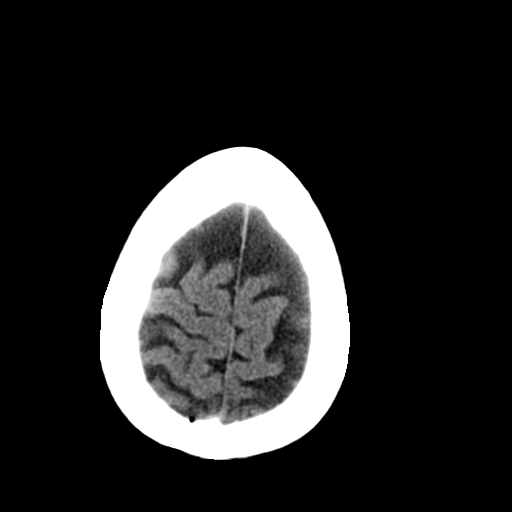
[im 27/36  brain]
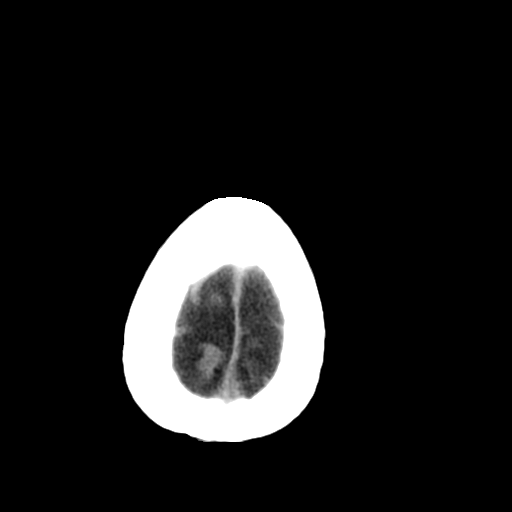
[im 29/36  brain]
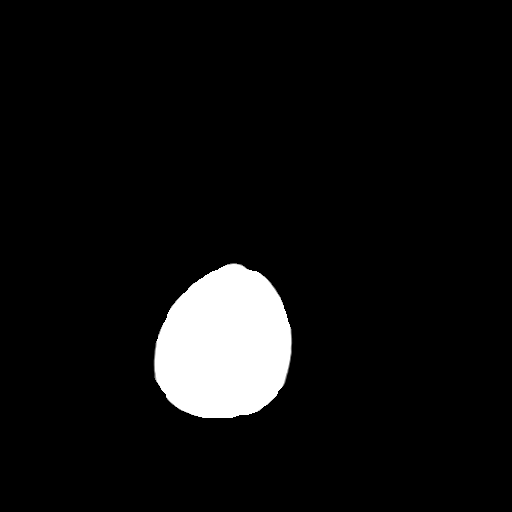
[im 29/36  bone]
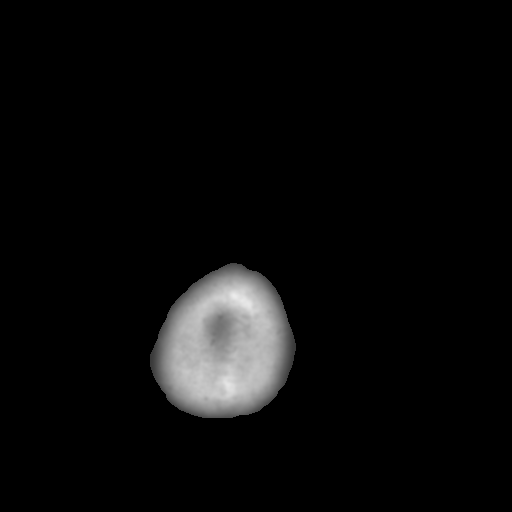
[im 32/36  brain]
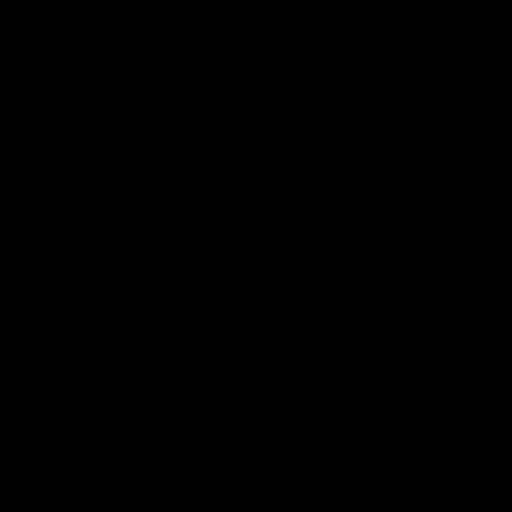
[im 34/36  brain]
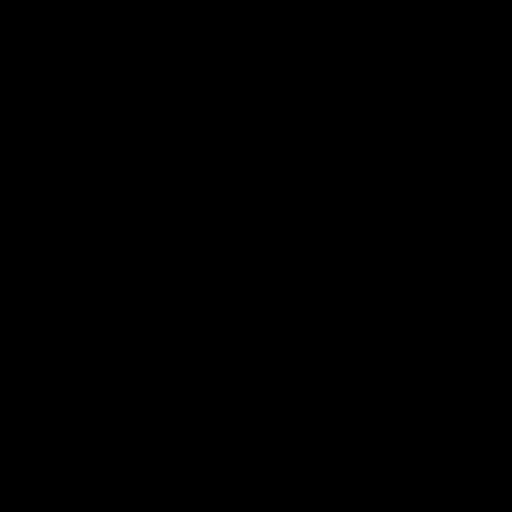

[15 of 30 positions shown; findings below may reference images not displayed]

FINDINGS: There is new subtle decreased density within the right frontal lobe
posteriorly. There is a tiny amount of increased density within the
sylvian fissure on the right which is new and may reflect small
amounts of blood. There is stable bifrontal cerebral atrophy. There
is no shift of the midline. Old lacunar infarctions in the basal
ganglia are present bilaterally. The cerebellum and brainstem are
normal in density.

At bone window settings the observed portions of the paranasal
sinuses and mastoid air cells are clear. There is no acute skull
fracture.
IMPRESSION: 1. The findings are consistent with early ischemic change in the
posterior aspect of the right frontal lobe. A tiny amount of acute
blood within or adjacent to the sylvian fissure on the right is
present.
2. There is no CT evidence of significantly increased intracranial
pressure.
3. No acute skull fracture is demonstrated.
4. Critical Value/emergent results were called by telephone at the
time of interpretation on 03/06/2014 at [DATE] to Dr. TOYOHIKO
LIENAD , who verbally acknowledged these results.

## 2014-09-27 IMAGING — MR MR MRA HEAD W/O CM
1 series · 16 of 48 positions shown · non-contrast
Comparison: MRI same day

CLINICAL DATA: Acute right frontal infarction

EXAM:
MRA HEAD WITHOUT CONTRAST
TECHNIQUE: Angiographic images of the Circle of Willis were obtained using MRA
technique without intravenous contrast.

[Series 2: MRA · axial · 0.8mm · 0.38mm/px · z∈[-80,+19]mm · 16 of 131 slices shown]
[im 1/131]
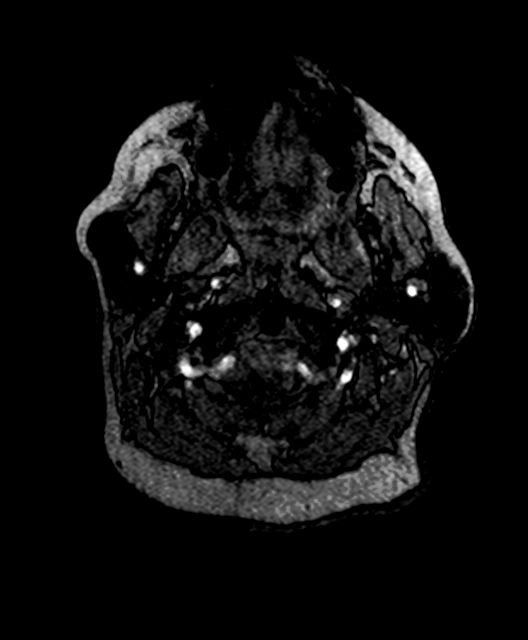
[im 3/131]
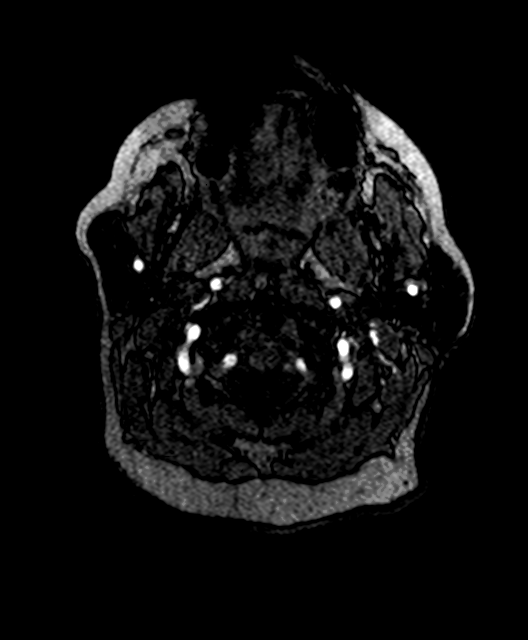
[im 6/131]
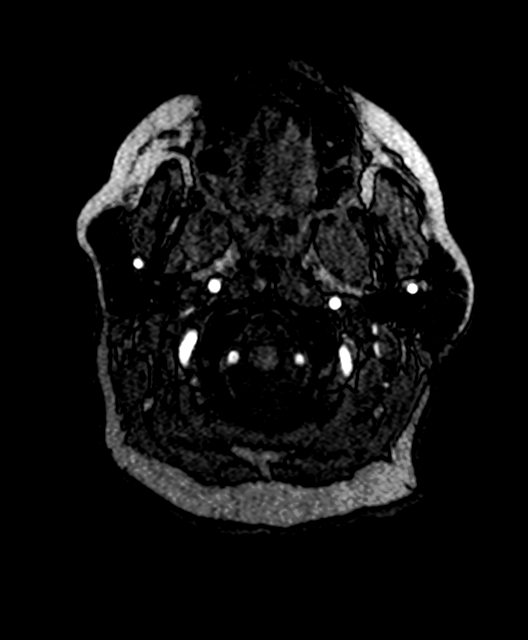
[im 9/131]
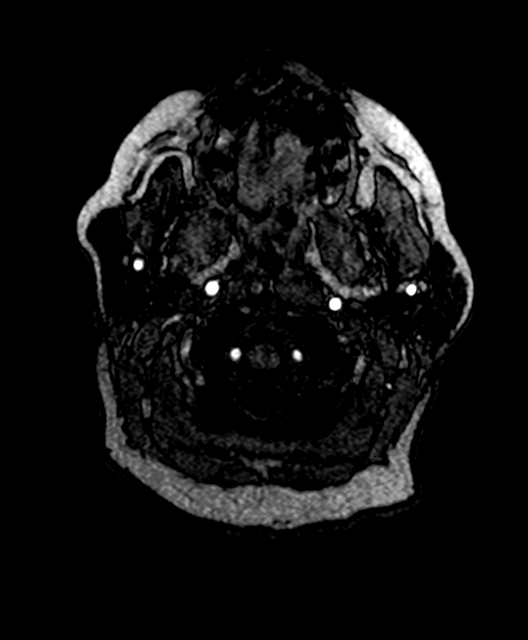
[im 12/131]
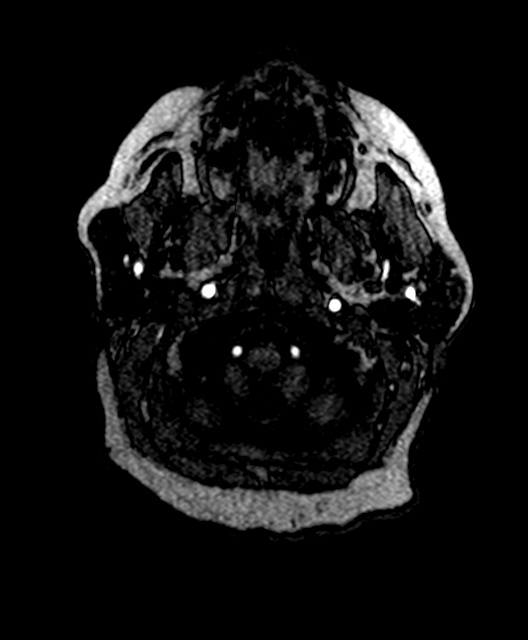
[im 14/131]
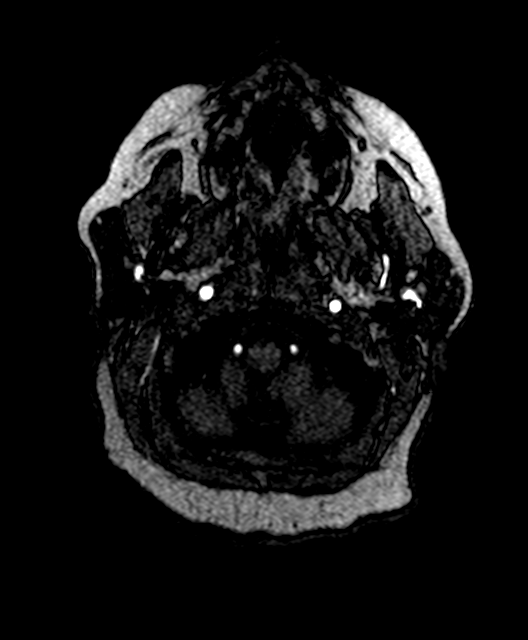
[im 23/131]
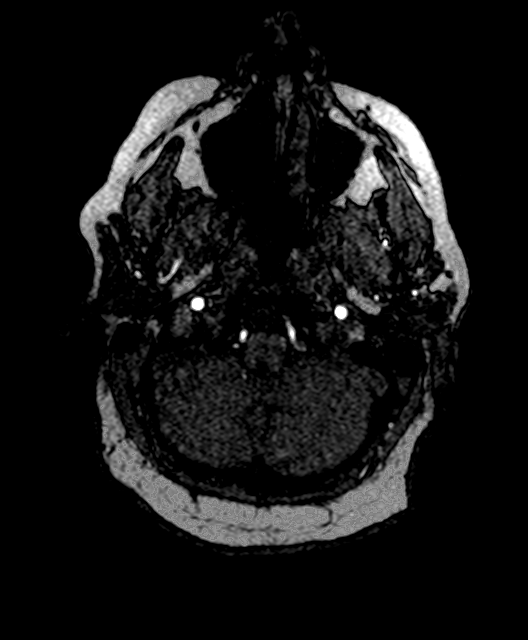
[im 25/131]
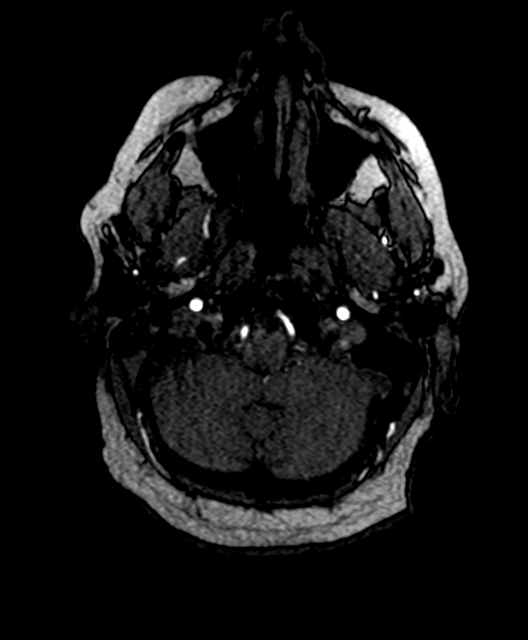
[im 42/131]
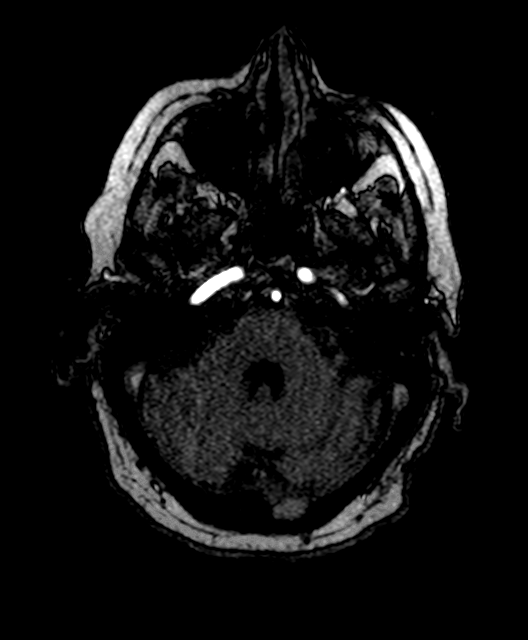
[im 59/131]
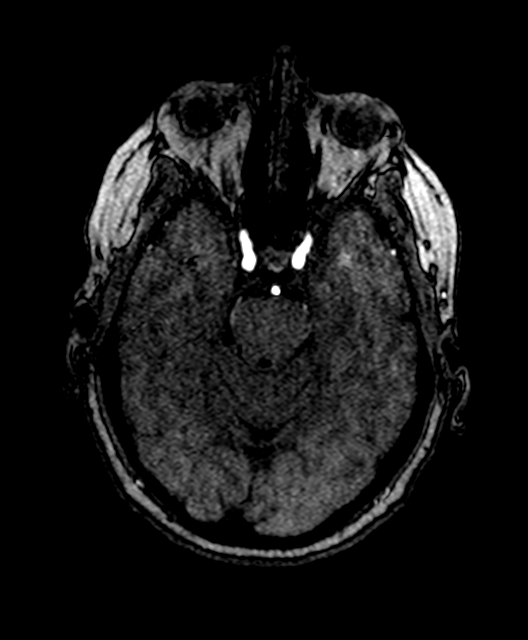
[im 67/131]
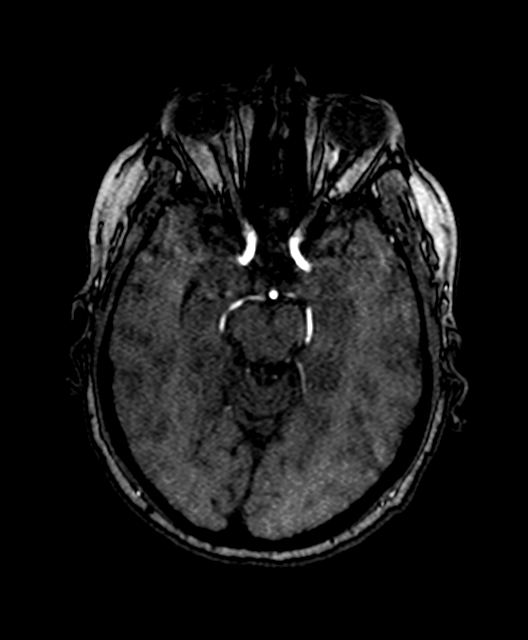
[im 75/131]
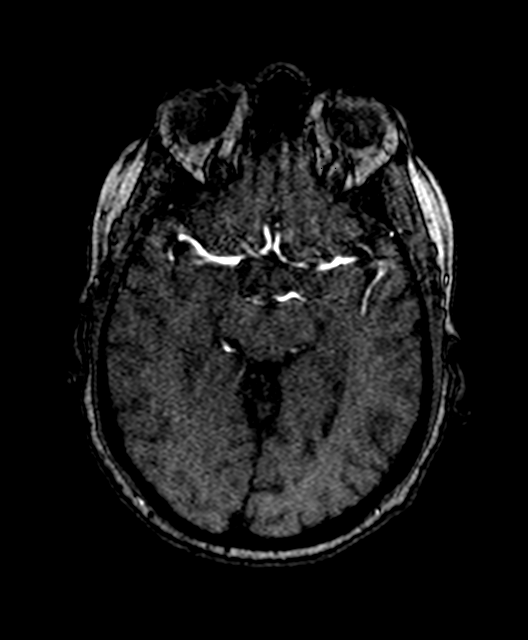
[im 92/131]
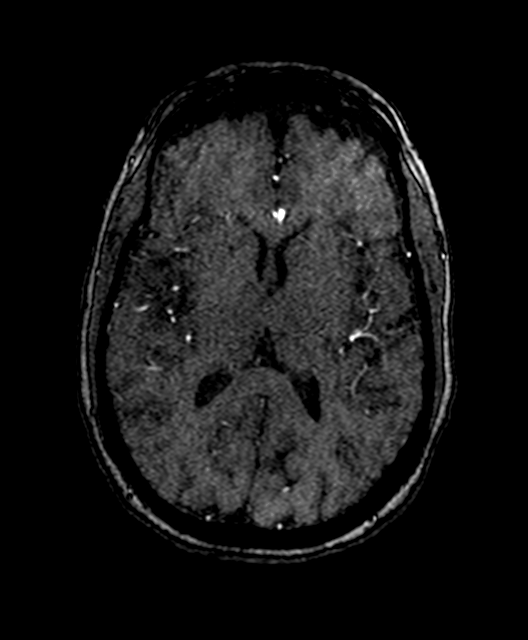
[im 108/131]
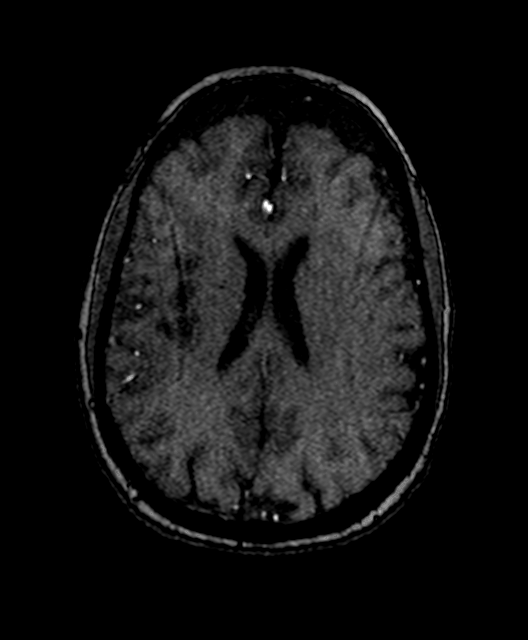
[im 111/131]
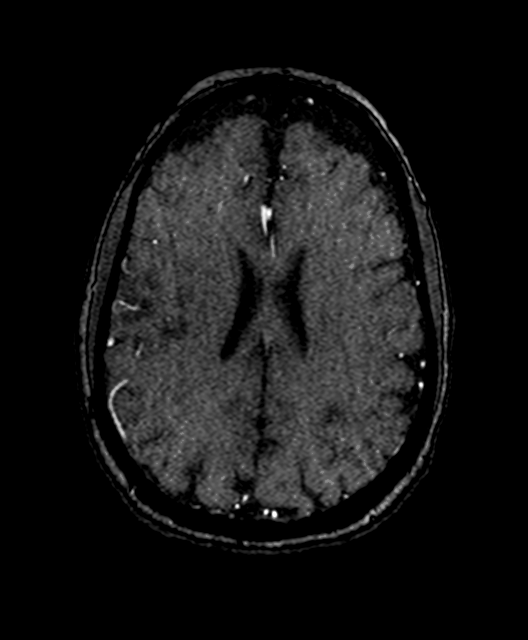
[im 125/131]
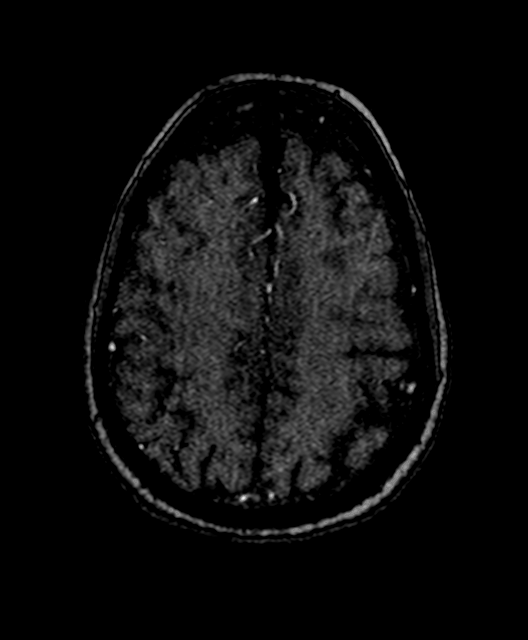

[16 of 48 positions shown; findings below may reference images not displayed]

FINDINGS: Both internal carotid arteries are widely patent into the brain. The
anterior and middle cerebral vessels are patent bilaterally. This
includes flow within the region of the acute infarction.
Intracranial branch vessels do show some narrowing and irregularity.

Both vertebral arteries are widely patent to the basilar. No basilar
stenosis. Posterior circulation branch vessels are patent, also
showing some distal vessel atherosclerotic irregularity.
IMPRESSION: No major vessel occlusion or correctable proximal stenosis. This
includes demonstrable flow within the region of the right frontal
cortical and subcortical infarction.

Distal intracranial branch vessels do show atherosclerotic
irregularity.

## 2014-09-27 IMAGING — US US CAROTID DUPLEX BILAT
1 series · 13 of 24 positions shown · non-contrast
Comparison: 05/14/2010

CLINICAL DATA: Acute right hemispheric cerebral infarction. History
of coronary artery disease, myocardial infarction, hyperlipidemia,
diabetes and smoking.

EXAM:
BILATERAL CAROTID DUPLEX ULTRASOUND
TECHNIQUE: Gray scale imaging, color Doppler and duplex ultrasound were
performed of bilateral carotid and vertebral arteries in the neck.

[Series 1: us carotid duplex bilat · 0.07mm/px · 13 of 93 slices shown]
[im 1/93]
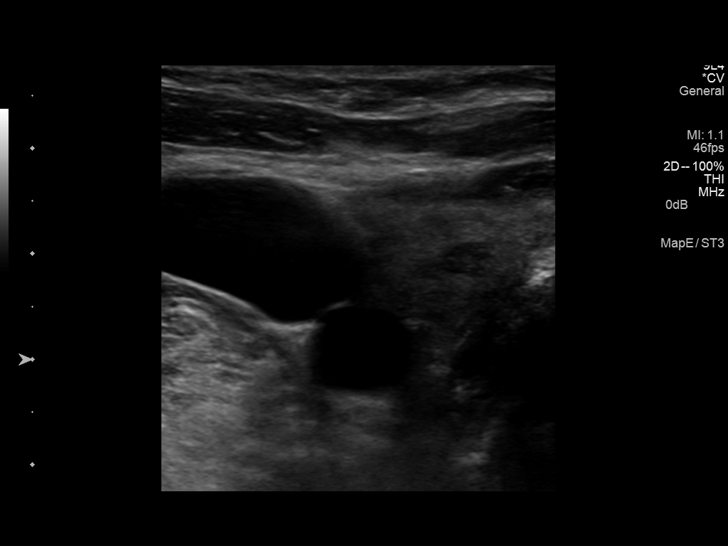
[im 9/93]
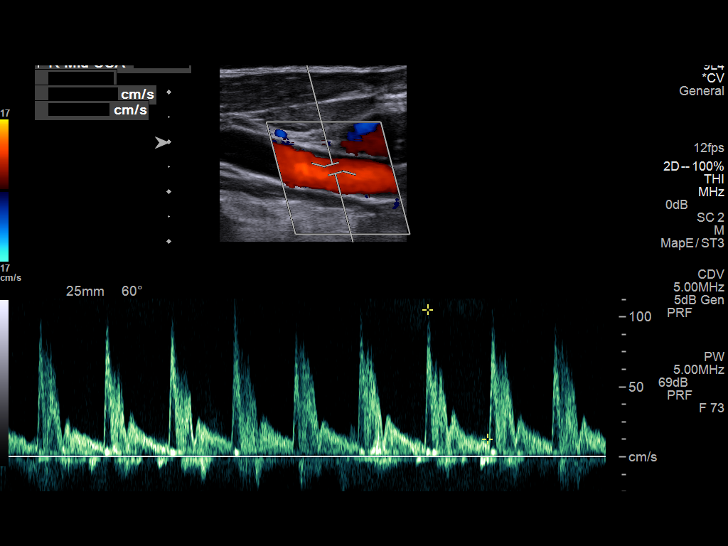
[im 17/93]
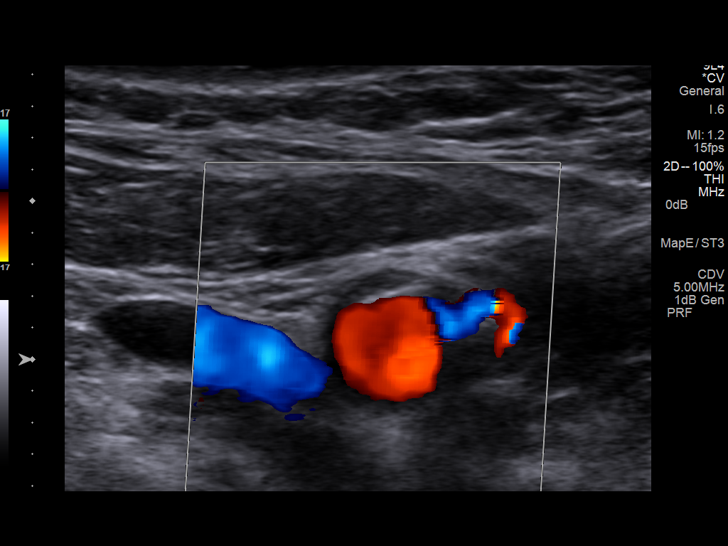
[im 25/93]
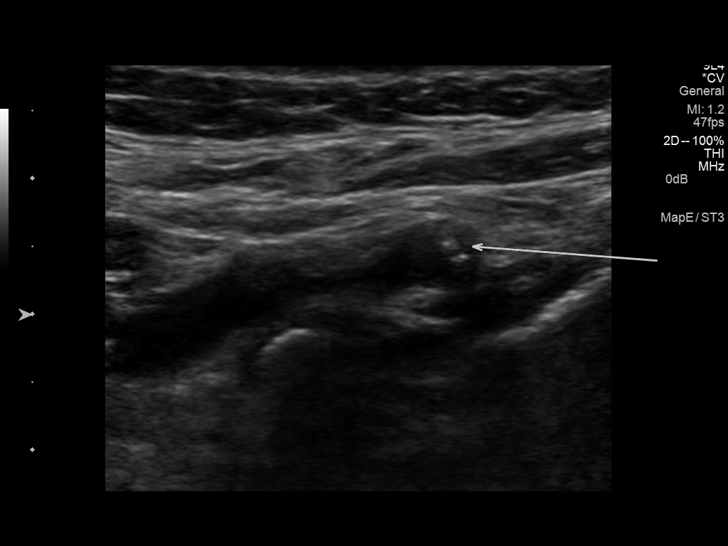
[im 33/93]
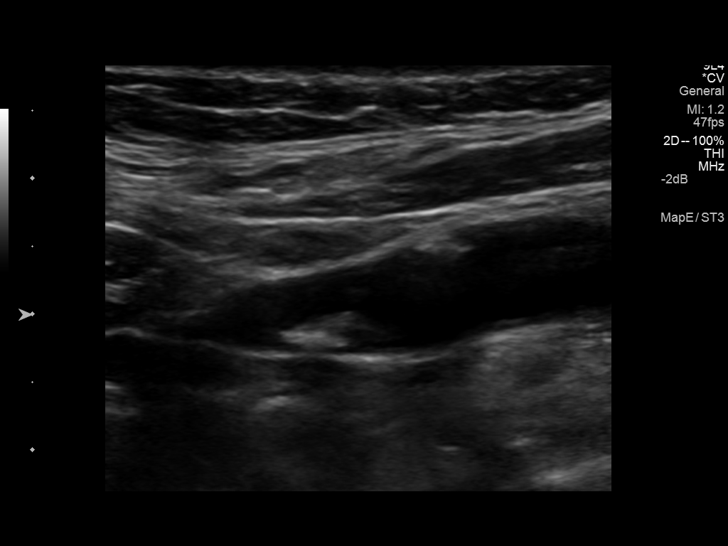
[im 41/93]
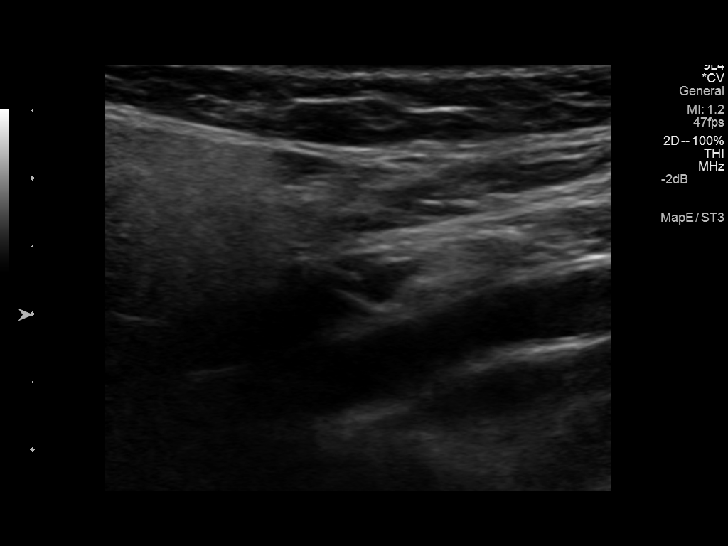
[im 49/93]
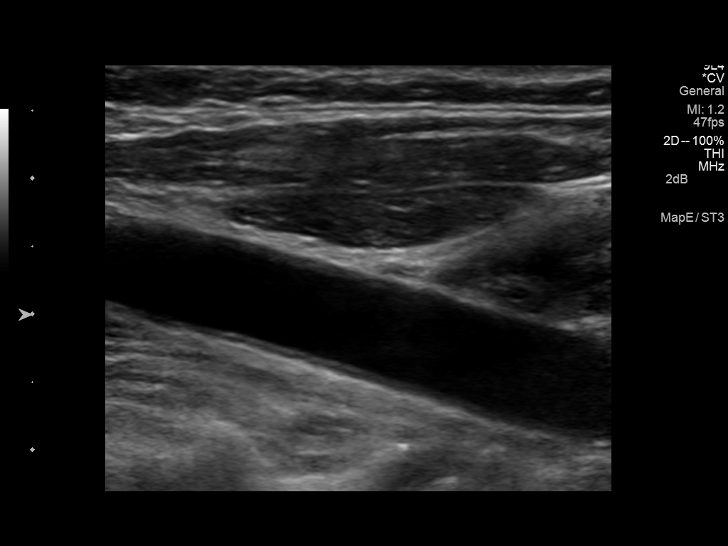
[im 53/93]
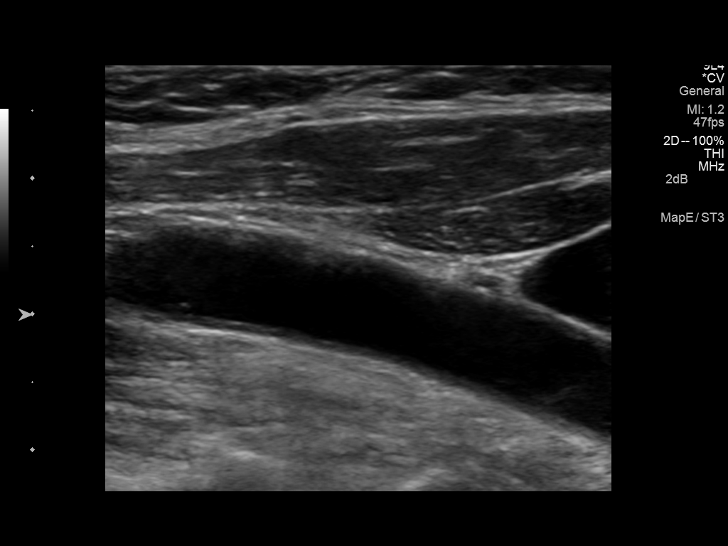
[im 61/93]
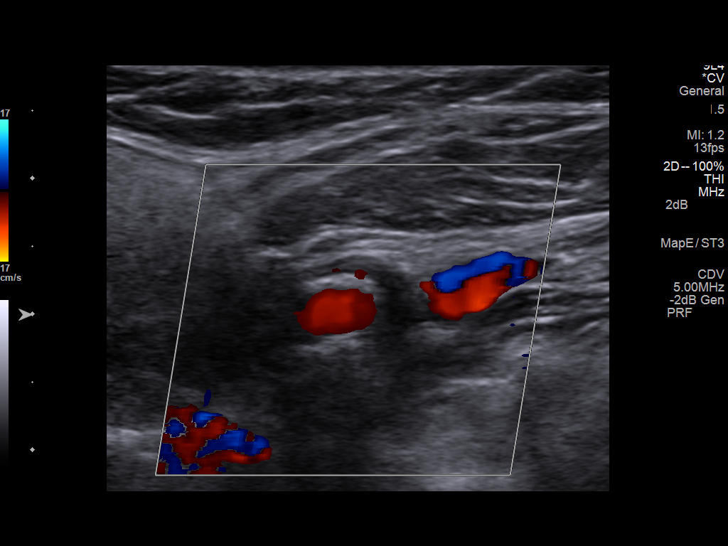
[im 69/93]
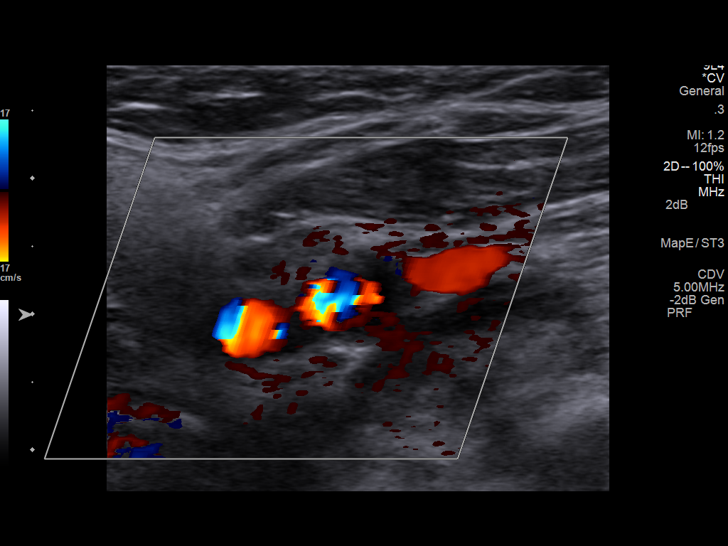
[im 77/93]
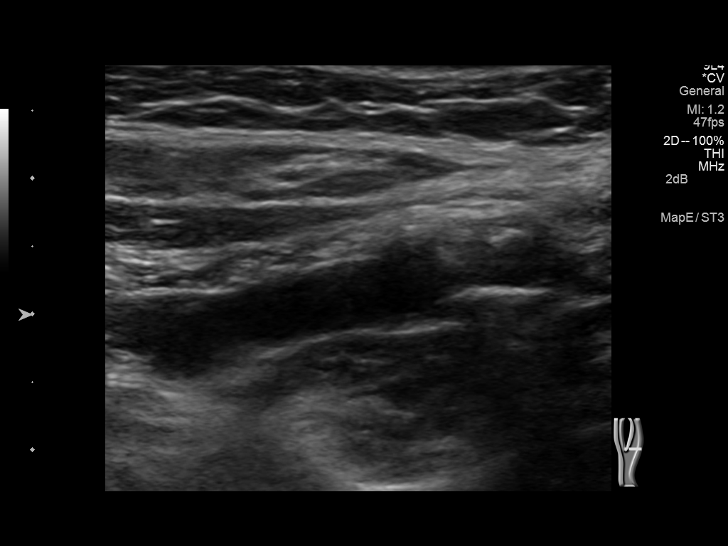
[im 85/93]
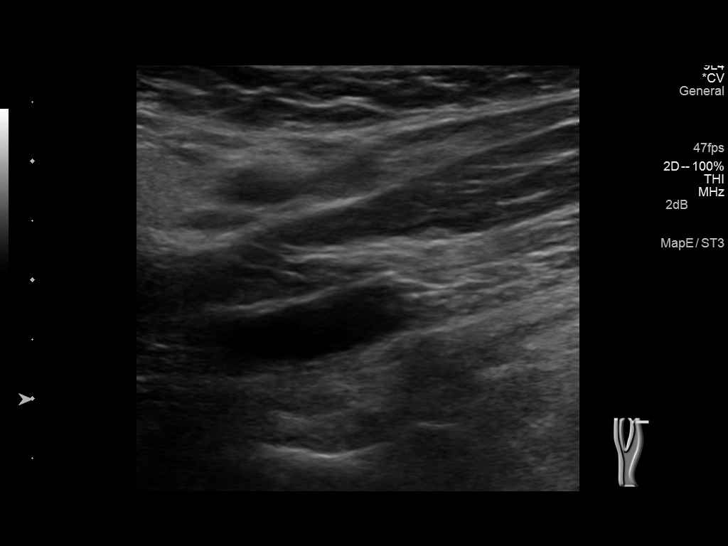
[im 93/93]
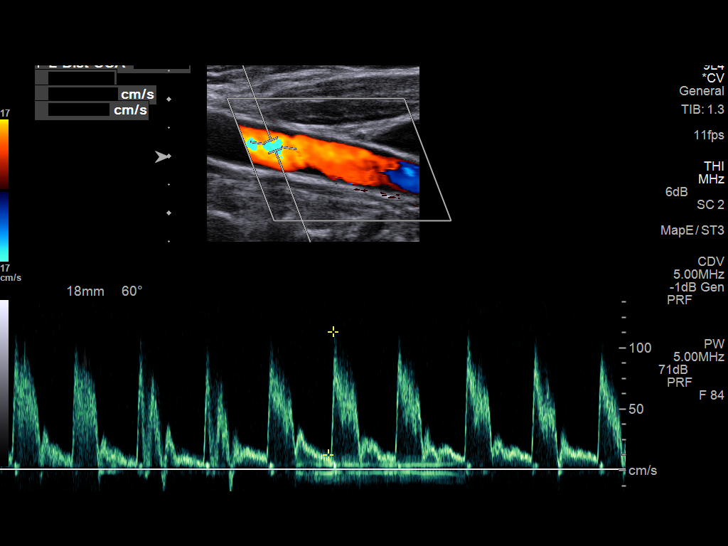

[13 of 24 positions shown; findings below may reference images not displayed]

FINDINGS: Criteria: Quantification of carotid stenosis is based on velocity
parameters that correlate the residual internal carotid diameter
with NASCET-based stenosis levels, using the diameter of the distal
internal carotid lumen as the denominator for stenosis measurement.

The following velocity measurements were obtained:

RIGHT

ICA:  132/29 cm/sec

CCA:  105/13 cm/sec

SYSTOLIC ICA/CCA RATIO:

DIASTOLIC ICA/CCA RATIO:

ECA:  183 cm/sec

LEFT

ICA:  197/22 cm/sec

CCA:  126/14 cm/sec

SYSTOLIC ICA/CCA RATIO:

DIASTOLIC ICA/CCA RATIO:

ECA:  301 cm/sec

RIGHT CAROTID ARTERY: A mild amount of predominately calcified
plaque is present at the level of the distal bulb and proximal ICA.
Proximal velocities and waveforms are unremarkable and estimated ICA
stenosis is less than 50%. Mildly elevated velocities in the higher
internal carotid segment within the upper neck noted at a level of
mild tortuosity without focal plaque.

RIGHT VERTEBRAL ARTERY: Antegrade flow with normal waveform and
velocity.

LEFT CAROTID ARTERY: Stable moderate plaque burden at the level of
the distal bulb and extending up to the ICA origin. This causes
luminal stenosis with turbulent flow. Estimated proximal left ICA
stenosis is 50- 69%.

LEFT VERTEBRAL ARTERY: Antegrade flow with normal waveform and
velocity.
IMPRESSION: Bilateral atherosclerotic plaque at the level of the distal carotid
bulbs and proximal internal carotid arteries. Estimated right ICA
stenosis is less than 50%. Estimated left ICA stenosis is 50- 69%.

## 2014-09-27 IMAGING — CR DG CHEST 2V
2 series · 2 of 2 positions shown · non-contrast
Comparison: March 25, 2013.

CLINICAL DATA: Cerebrovascular accident.

EXAM:
CHEST  2 VIEW

[view not recorded (1 of 2)]
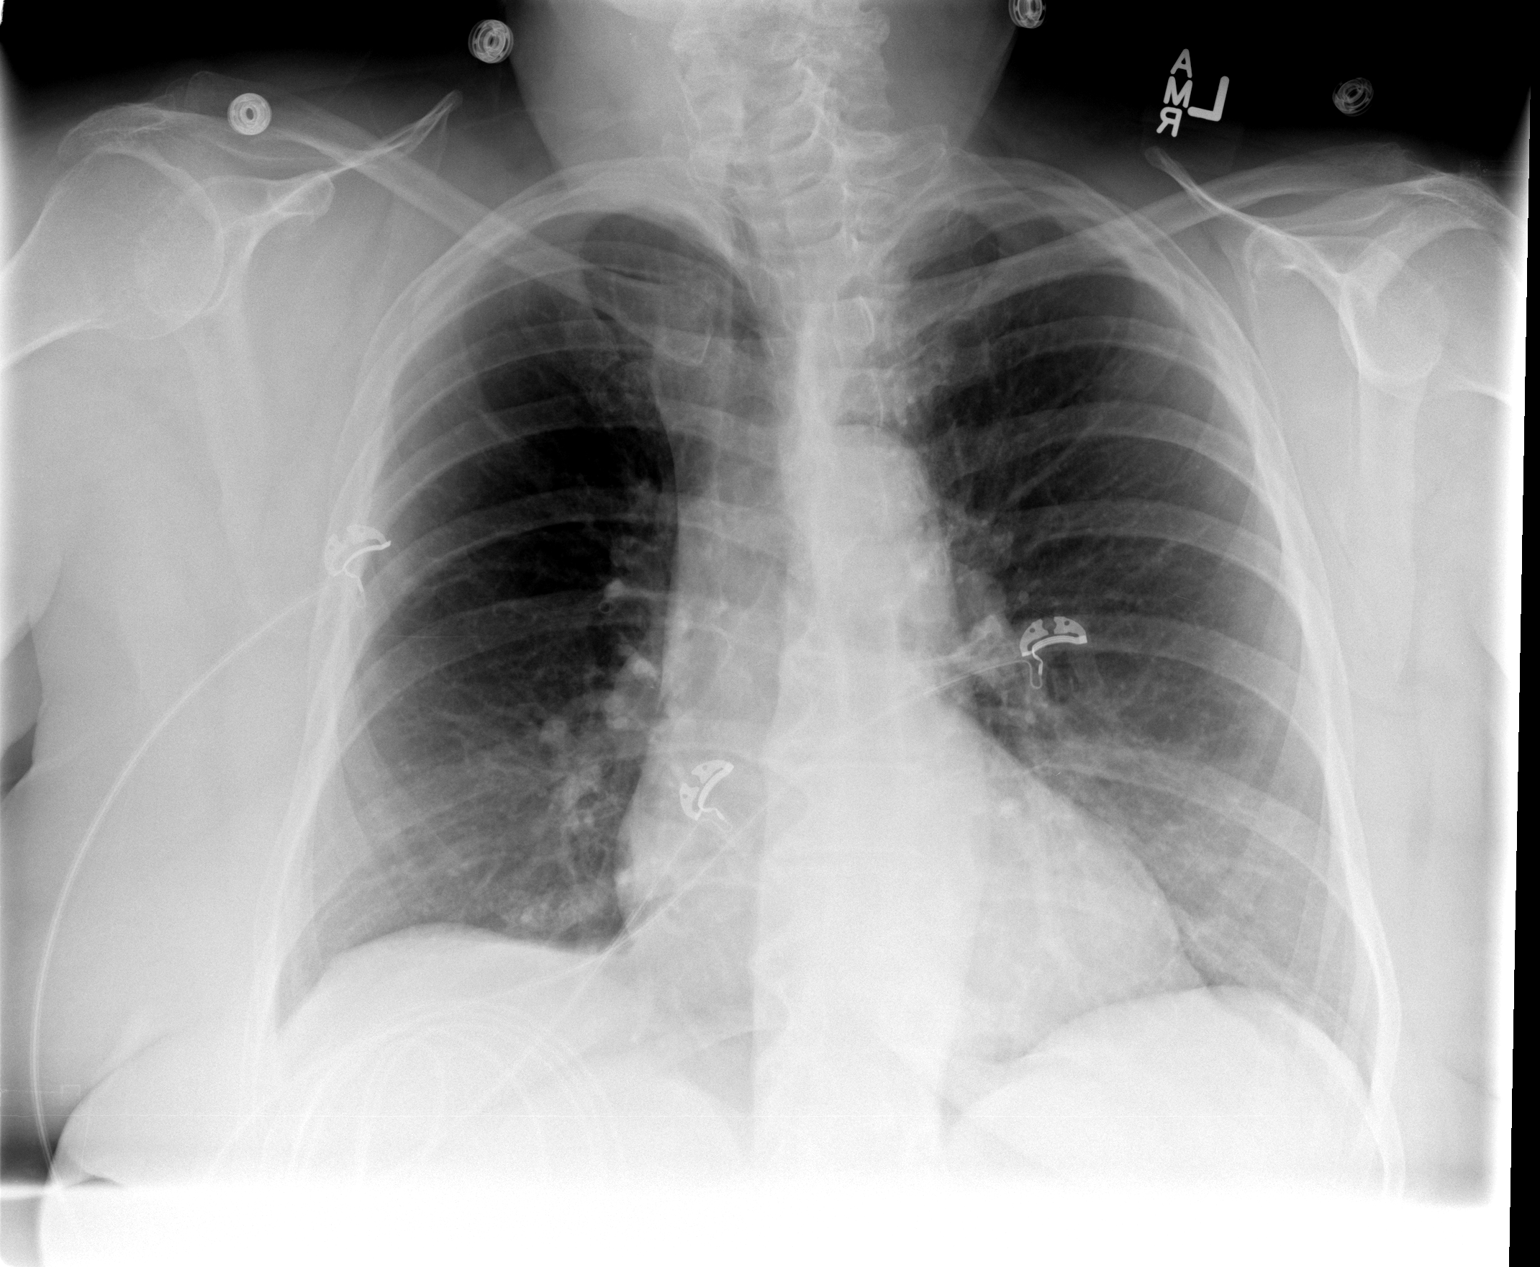

[view not recorded (2 of 2)]
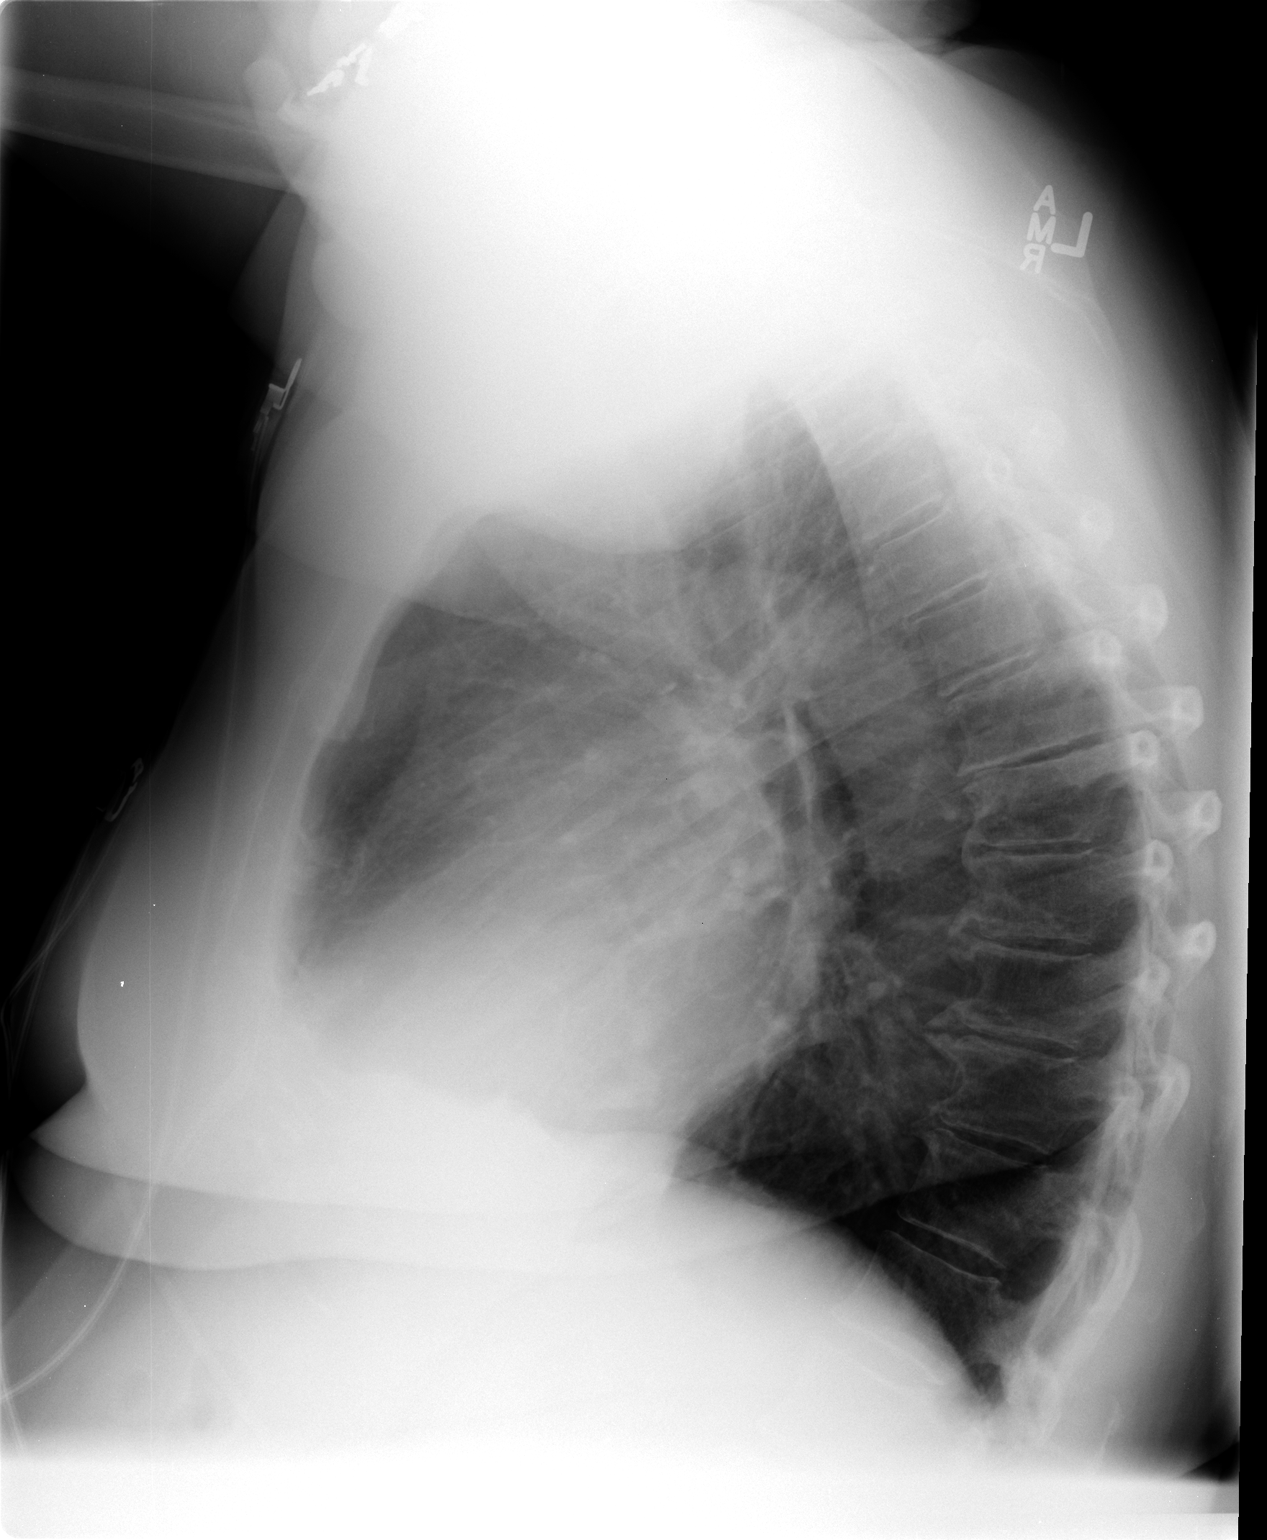

[2 of 2 positions shown; findings below may reference images not displayed]

FINDINGS: The heart size and mediastinal contours are within normal limits.
Both lungs are clear. No pneumothorax or pleural effusion is noted.
The visualized skeletal structures are unremarkable.
IMPRESSION: No acute cardiopulmonary abnormality seen.

## 2014-09-28 ENCOUNTER — Ambulatory Visit (INDEPENDENT_AMBULATORY_CARE_PROVIDER_SITE_OTHER): Payer: Medicare Other | Admitting: Internal Medicine

## 2014-09-30 IMAGING — CT CT HEAD W/O CM
1 series · 16 of 30 positions shown, 20 images · non-contrast
Comparison: CT 03/06/2014

CLINICAL DATA: Stroke

EXAM:
CT HEAD WITHOUT CONTRAST
TECHNIQUE: Contiguous axial images were obtained from the base of the skull
through the vertex without intravenous contrast.

[Series 2: headseq 4.8 h37s · axial · 0.43mm/px · z∈[+142,+300]mm · 16 of 36 slices shown, 20 images]
[im 2/36  brain]
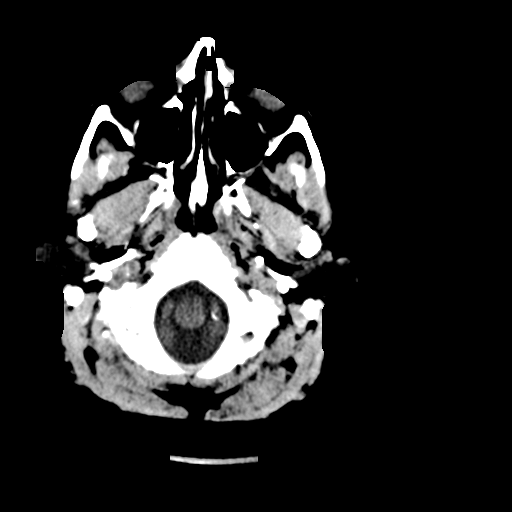
[im 2/36  bone]
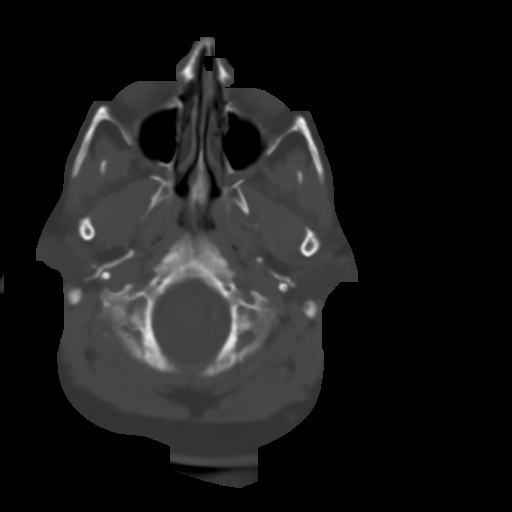
[im 4/36  brain]
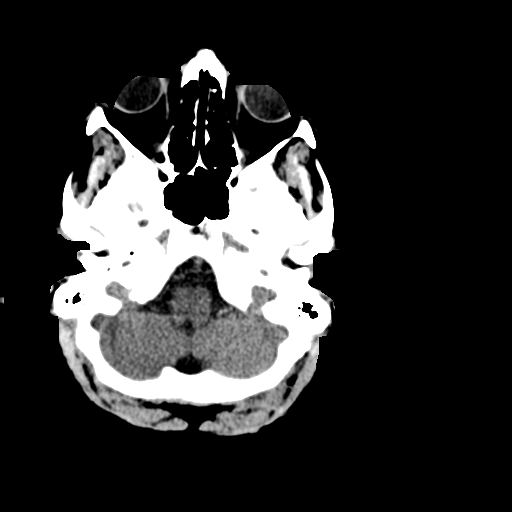
[im 7/36  brain]
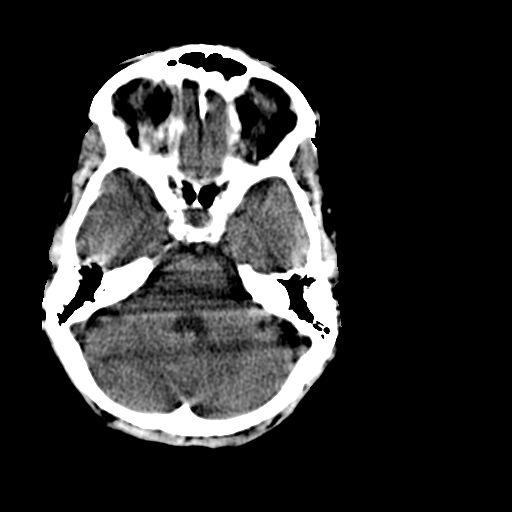
[im 9/36  brain]
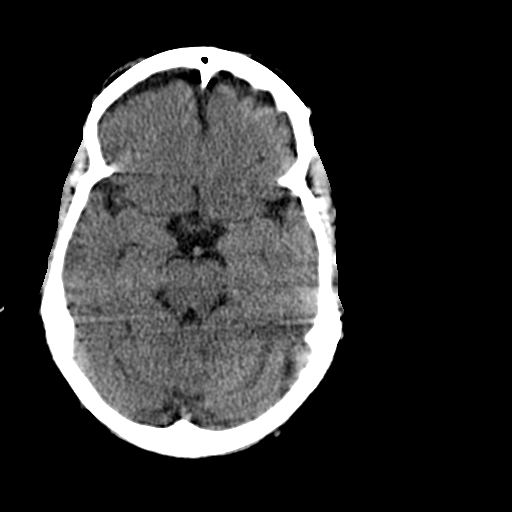
[im 10/36  brain]
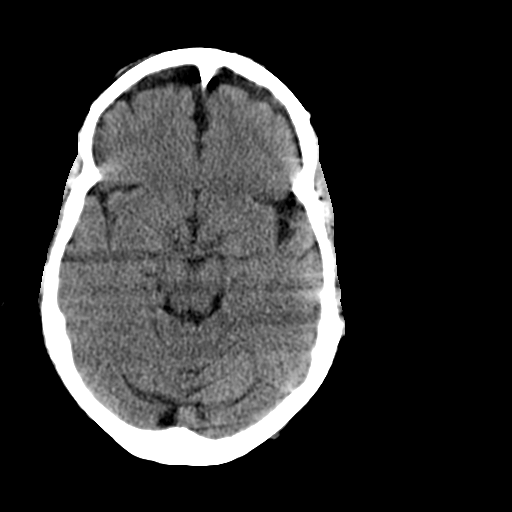
[im 10/36  bone]
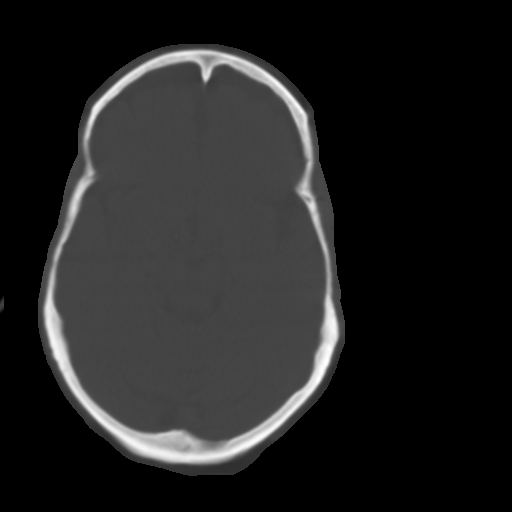
[im 13/36  brain]
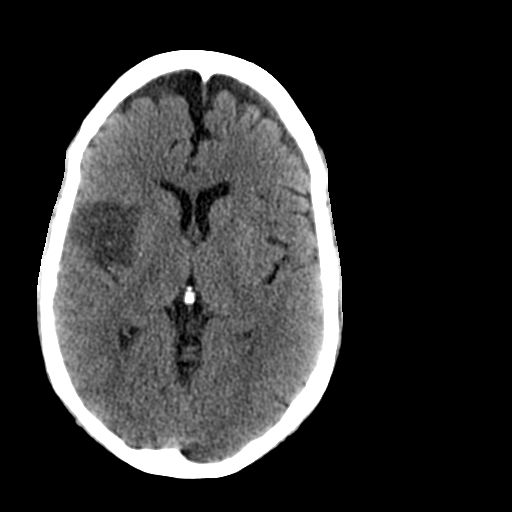
[im 15/36  brain]
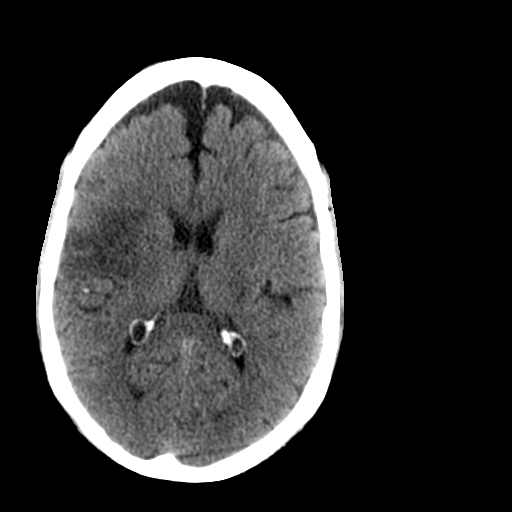
[im 17/36  brain]
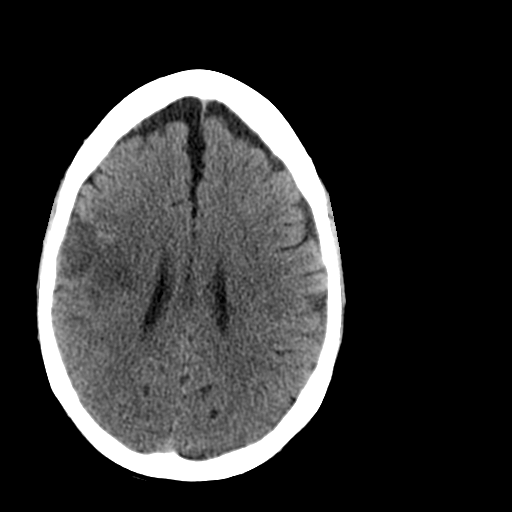
[im 19/36  brain]
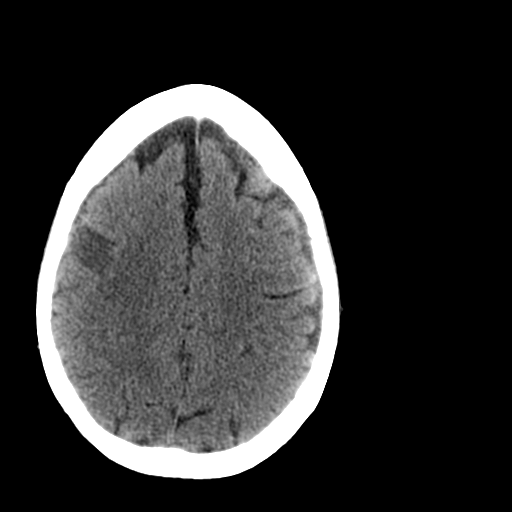
[im 19/36  bone]
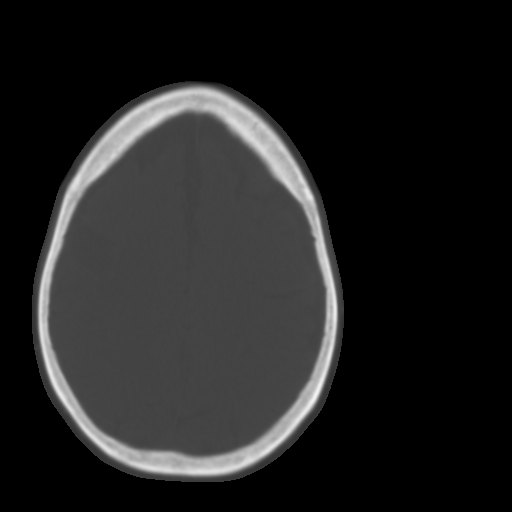
[im 21/36  brain]
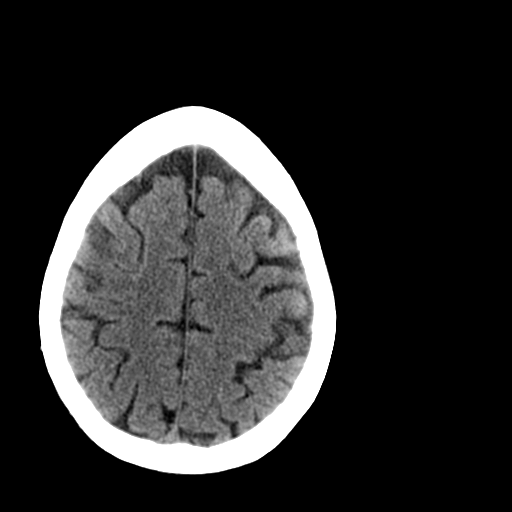
[im 23/36  brain]
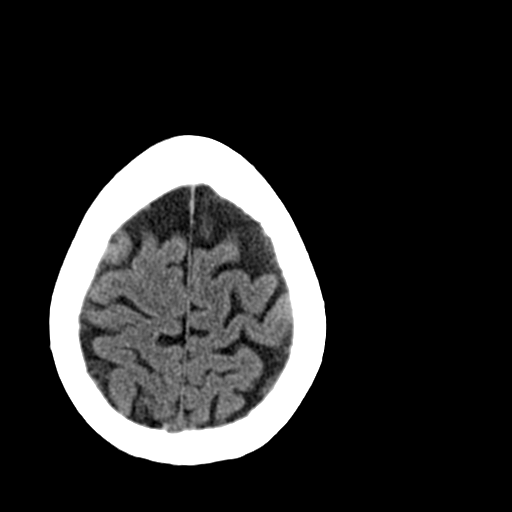
[im 26/36  brain]
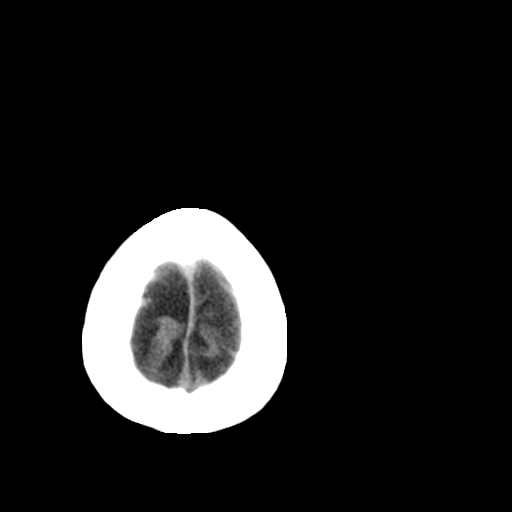
[im 27/36  brain]
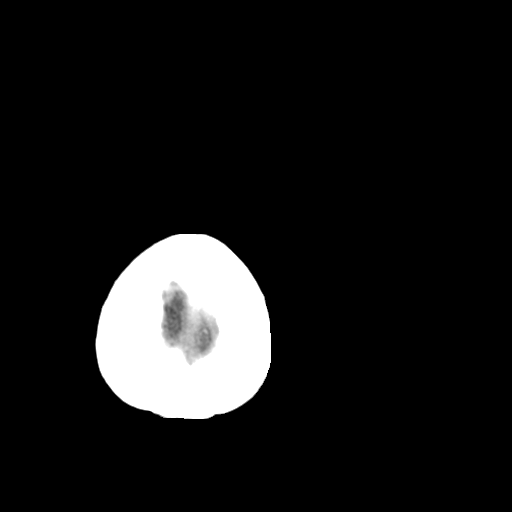
[im 27/36  bone]
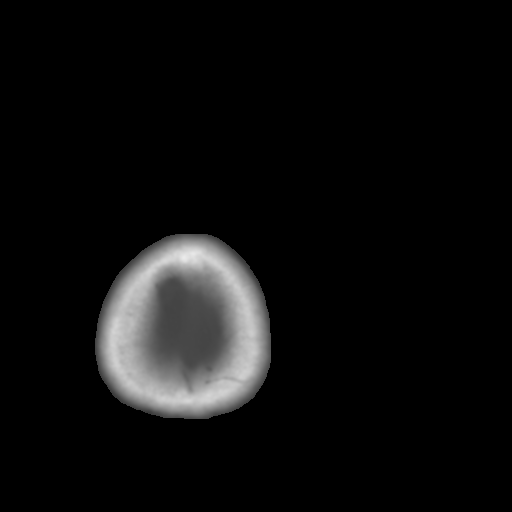
[im 29/36  brain]
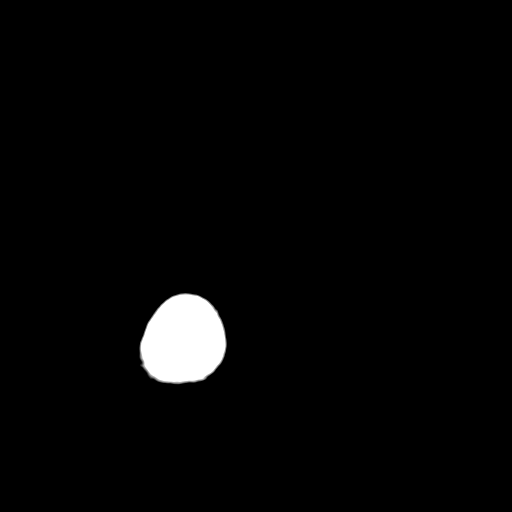
[im 32/36  brain]
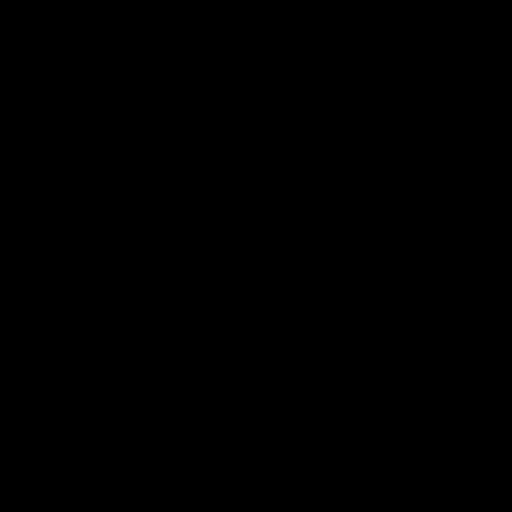
[im 34/36  brain]
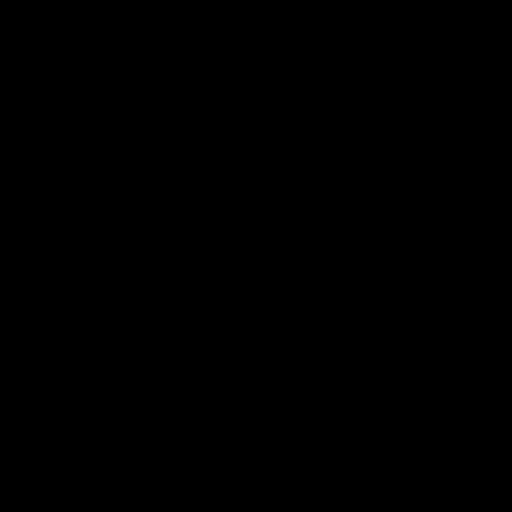

[16 of 30 positions shown; findings below may reference images not displayed]

FINDINGS: Right frontal operculum infarct shows progressive low-density but is
similar in size to the prior CT and MRI. Hyperdense vessel in the
posterior right sylvian fissure appears to have migrated since the
prior study. MRI may be helpful to determine if this infarct has
extended. There is no associated hemorrhage. No shift of the midline
structures

Ventricle size is normal.
IMPRESSION: Acute infarct in the right frontal operculum similar in size to the
prior study. No hemorrhage. New area of hyperdense thrombus in the
posterior right MCA branch which may be due to thrombus migration.
MRI may be helpful to determine if there has been infarct extension.

## 2014-10-09 ENCOUNTER — Encounter (INDEPENDENT_AMBULATORY_CARE_PROVIDER_SITE_OTHER): Payer: Self-pay | Admitting: *Deleted

## 2014-10-09 ENCOUNTER — Ambulatory Visit (INDEPENDENT_AMBULATORY_CARE_PROVIDER_SITE_OTHER): Payer: Medicare Other | Admitting: Internal Medicine

## 2014-10-09 ENCOUNTER — Encounter (INDEPENDENT_AMBULATORY_CARE_PROVIDER_SITE_OTHER): Payer: Self-pay | Admitting: Internal Medicine

## 2014-10-09 VITALS — BP 108/58 | HR 60 | Temp 97.8°F | Ht 63.0 in | Wt 180.3 lb

## 2014-10-09 DIAGNOSIS — N179 Acute kidney failure, unspecified: Secondary | ICD-10-CM | POA: Diagnosis not present

## 2014-10-09 DIAGNOSIS — E119 Type 2 diabetes mellitus without complications: Secondary | ICD-10-CM | POA: Diagnosis not present

## 2014-10-09 DIAGNOSIS — K760 Fatty (change of) liver, not elsewhere classified: Secondary | ICD-10-CM

## 2014-10-09 NOTE — Progress Notes (Signed)
Subjective:    Patient ID: [name], female    DOB: 1940-10-22, 74 y.o.   MRN: [id number]  HPI Patient is here today for f/o of her NAFLD. She underwent an EGD in October and two columns of esophageal varices were banded.  She has lost 10 pounds since her last visit in August. She tells me she really does not have an appetite since her CVA in September. She says she is doing well. She is walking 15 minutes a day. No abdominal pain. BMs daily or every other day. No melena or BRRB.  09/26/2014 AFP 8.0 CMP Latest Ref Rng 09/03/2014 06/28/2014 03/22/2014  Glucose 70 - 99 mg/dL 150(H) 142(H) 109(H)  BUN 6 - 23 mg/dL 21 21 15   Creatinine 0.50 - 1.10 mg/dL 1.23(H) 1.10 1.69(H)  Sodium 135 - 145 mmol/L 137 142 148(H)  Potassium 3.5 - 5.1 mmol/L 4.6 3.9 3.6(L)  Chloride 96 - 112 mEq/L 106 107 113(H)  CO2 19 - 32 mmol/L 25 24 26   Calcium 8.4 - 10.5 mg/dL 8.9 8.7 8.0(L)  Total Protein 6.0 - 8.3 g/dL 6.6 - 5.7(L)  Total Bilirubin 0.3 - 1.2 mg/dL 0.5 - 0.4  Alkaline Phos 39 - 117 U/L 109 - 63  AST 0 - 37 U/L 22 - 16  ALT 0 - 35 U/L 16 - 10   CBC    Component Value Date/Time   WBC 5.9 09/26/2014 1022   RBC 4.10 09/26/2014 1022   HGB 12.1 09/26/2014 1022   HCT 36.2 09/26/2014 1022   PLT 128* 09/26/2014 1022   MCV 88.3 09/26/2014 1022   MCH 29.5 09/26/2014 1022   MCHC 33.4 09/26/2014 1022   RDW 14.6 09/26/2014 1022   LYMPHSABS 1.1 09/03/2014 1250   MONOABS 0.6 09/03/2014 1250   EOSABS 0.2 09/03/2014 1250   BASOSABS 0.0 09/03/2014 1250      06/21/2014: Procedure:   EGD with esophageal variceal banding.  Indications:  Patient is 74 year old Caucasian female with cirrhosis secondary to half old complicated by esophageal variceal bleed last year. She is now returning for EGD and banding for recurrent varices. She was hospitalized about 3 months ago in order to have heme positive stool and low hemoglobin but there is no history of melena or  rectal bleeding. Hemoglobin 7 weeks ago  was up to 11.8 g. Patient is on low-dose aspirin because of history of CVA(4 months ago)                  Impression: Two columns of esophageal varices were banded. Portal gastropathy. 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     03/22/2014 US abdomen  1. Cirrhotic liver morphology.2. Negative for ascites. 3. Main portal vein remains patent with hepatopetal flow. 4. Nonspecific 6 mm echogenic focus at the gallbladder fundus which was not clearly seen on the prior ultrasound from 04/04/2013. Differential considerations include infolding of the gallbladder wall, developing adenomyomatosis, and possibly a small developing gallbladder polyp. Recommend attention on the next routine follow-up   Review of Systems Past Medical History  Diagnosis Date  . Back pain   . GERD (gastroesophageal reflux disease)   . MI (myocardial infarction)   . Hypercholesteremia   . Anemia   . Cirrhosis   . Aortic sclerosis 07/04/2011  . Aortic insufficiency 07/04/2011    most recent 2D Echo 02/24/2012  EF greater than 55%  . Carotid stenosis, bilateral 07/04/2011    most recent 05/2010 carotid doppler  . Presence of  stent in right coronary artery 07/04/2011  . HTN (hypertension) 07/04/2011  . Coronary artery disease   . Myocardial infarction 1995  . GERD (gastroesophageal reflux disease)   . Non-alcoholic fatty liver disease   . UTI (lower urinary tract infection)     chronic/recurring/on bactrim  . CVA (cerebral infarction)     03/07/2014  . Hiatal hernia   . IBS (irritable bowel syndrome)   . Diabetes mellitus 2005  . Varices, esophageal   . Stroke 03/07/2014    no deficits from stroke  . OSA (obstructive sleep apnea) 07/04/2011    sleep study 2009  AHI 9.91/hr and during REM sleep 35.58/hr    Past Surgical History  Procedure Laterality Date  . Back surgery  1985  . Tonsillectomy    . Appendectomy    . Colonoscopy    . Eye surgery  08    cataract surgery of the left eye    . Ercp    . Myringotomy  2012    both ears  . Hardware removal Right 01/17/2013    Procedure: REMOVAL OF HARDWARE AND EXCISION ULNAR STYLOID RIGHT WRIST;  Surgeon: Tennis Must, MD;  Location: [facility];  Service: Orthopedics;  Laterality: Right;  . Wrist surgery      rt wrist hardwear removal  . Esophagogastroduodenoscopy N/A 04/01/2013    Procedure: ESOPHAGOGASTRODUODENOSCOPY (EGD);  Surgeon: Rogene Houston, MD;  [facility];  Service: Endoscopy;  Laterality: N/A;  230-rescheduled to 8:30am Ann notified pt  . Esophageal banding N/A 04/01/2013    Procedure: ESOPHAGEAL BANDING;  Surgeon: Rogene Houston, MD;  [facility];  Service: Endoscopy;  Laterality: N/A;  . Esophagogastroduodenoscopy N/A 05/24/2013    Procedure: ESOPHAGOGASTRODUODENOSCOPY (EGD);  Surgeon: Rogene Houston, MD;  [facility];  Service: Endoscopy;  Laterality: N/A;  730  . Esophageal banding N/A 05/24/2013    Procedure: ESOPHAGEAL BANDING;  Surgeon: Rogene Houston, MD;  [facility];  Service: Endoscopy;  Laterality: N/A;  . Dilation and curettage of uterus      x2  . Vaginal hysterectomy  1972  . Anal fissure repair  1972  . Epi retinal membrane peel Left   . Esophagogastroduodenoscopy N/A 06/21/2014    Procedure: ESOPHAGOGASTRODUODENOSCOPY (EGD);  Surgeon: Rogene Houston, MD;  [facility];  Service: Endoscopy;  Laterality: N/A;  930-rescheduled 10/14 @ 1200 Ann to notify pt  . Esophageal banding N/A 06/21/2014    Procedure: ESOPHAGEAL BANDING;  Surgeon: Rogene Houston, MD;  [facility];  Service: Endoscopy;  Laterality: N/A;  . Cardiac catheterization  1995.,2006,1997    stent to the proximal RCA after MI   . Cataract extraction w/phaco Right 07/04/2014    Procedure: CATARACT EXTRACTION PHACO AND INTRAOCULAR LENS PLACEMENT (IOC);  Surgeon: Elta Guadeloupe T. Gershon Crane, MD;  Location: [facility];  Service: Ophthalmology;  Laterality: Right;   CDE:13.13    Allergies  Allergen Reactions  . Tape Rash    ADHESIVE TAPE    Current Outpatient Prescriptions on File Prior to Visit  Medication Sig Dispense Refill  . aspirin 81 MG tablet Take 81 mg by mouth daily.    . carvedilol (COREG) 12.5 MG tablet Take 1 tablet by mouth 2 (two) times daily.    . carvedilol (COREG) 12.5 MG tablet Take 1 tablet (12.5 mg total) by mouth 2 (two) times daily. <PLEASE MAKE APPOINTMENT WITH CARDIOLOGIST FOR REFILLS> 60 tablet 2  . clopidogrel (  PLAVIX) 75 MG tablet Take 75 mg by mouth daily.    Marland Kitchen HUMULIN N KWIKPEN 100 UNIT/ML Kiwkpen Inject 50 Units into the skin 2 (two) times daily.     . insulin glargine (LANTUS SOLOSTAR) 100 UNIT/ML injection Inject 50 Units into the skin at bedtime.     . isosorbide mononitrate (IMDUR) 30 MG 24 hr tablet Take 30 mg by mouth daily.    Marland Kitchen omeprazole (PRILOSEC OTC) 20 MG tablet Take 20 mg by mouth daily.    . rosuvastatin (CRESTOR) 40 MG tablet Take 1 tablet (40 mg total) by mouth at bedtime. 30 tablet 9  . zolpidem (AMBIEN) 10 MG tablet Take 1 tablet by mouth at bedtime.     No current facility-administered medications on file prior to visit.        Objective:   Physical Exam  Filed Vitals:   10/09/14 1414  Height: 5' 3"  (1.6 m)  Weight: 180 lb 4.8 oz (81.784 kg)   Alert and oriented. Skin warm and dry. Oral mucosa is moist.   . Sclera anicteric, conjunctivae is pink. Thyroid not enlarged. No cervical lymphadenopathy. Lungs clear. Heart regular rate and rhythm.  Abdomen is soft. Bowel sounds are positive. No hepatomegaly. No abdominal masses felt. No tenderness.  No edema to lower extremities.         Assessment & Plan:  NAFLD. Slight increase in her AFP.  Hepatic function, PT/INR, AFP, US abdomen. OV in 6 months.

## 2014-10-09 NOTE — Patient Instructions (Signed)
OV in 6 months.

## 2014-10-10 LAB — AFP TUMOR MARKER: AFP TUMOR MARKER: 8.7 ng/mL — AB (ref <6.1)

## 2014-10-10 LAB — PROTIME-INR
INR: 1.14 (ref <1.50)
Prothrombin Time: 14.6 seconds (ref 11.6–15.2)

## 2014-10-11 DIAGNOSIS — R1310 Dysphagia, unspecified: Secondary | ICD-10-CM | POA: Diagnosis not present

## 2014-10-11 DIAGNOSIS — I6990 Unspecified sequelae of unspecified cerebrovascular disease: Secondary | ICD-10-CM | POA: Diagnosis not present

## 2014-10-11 DIAGNOSIS — R471 Dysarthria and anarthria: Secondary | ICD-10-CM | POA: Diagnosis not present

## 2014-10-11 LAB — HEPATIC FUNCTION PANEL
ALT: 16 U/L (ref 0–35)
AST: 21 U/L (ref 0–37)
Albumin: 3.8 g/dL (ref 3.5–5.2)
Alkaline Phosphatase: 97 U/L (ref 39–117)
BILIRUBIN TOTAL: 0.5 mg/dL (ref 0.2–1.2)
Bilirubin, Direct: 0.1 mg/dL (ref 0.0–0.3)
Indirect Bilirubin: 0.4 mg/dL (ref 0.2–1.2)
Total Protein: 6.3 g/dL (ref 6.0–8.3)

## 2014-10-12 ENCOUNTER — Ambulatory Visit (HOSPITAL_COMMUNITY)
Admission: RE | Admit: 2014-10-12 | Discharge: 2014-10-12 | Disposition: A | Discharge status: Home / Self Care | Payer: Medicare Other | Source: Ambulatory Visit | Attending: Internal Medicine | Admitting: Internal Medicine

## 2014-10-12 DIAGNOSIS — K7689 Other specified diseases of liver: Secondary | ICD-10-CM | POA: Diagnosis not present

## 2014-10-12 DIAGNOSIS — R161 Splenomegaly, not elsewhere classified: Secondary | ICD-10-CM | POA: Diagnosis not present

## 2014-10-12 DIAGNOSIS — K760 Fatty (change of) liver, not elsewhere classified: Secondary | ICD-10-CM | POA: Diagnosis not present

## 2014-11-03 ENCOUNTER — Encounter (INDEPENDENT_AMBULATORY_CARE_PROVIDER_SITE_OTHER): Payer: Self-pay | Admitting: *Deleted

## 2014-11-06 DIAGNOSIS — M1991 Primary osteoarthritis, unspecified site: Secondary | ICD-10-CM | POA: Diagnosis not present

## 2014-11-07 DIAGNOSIS — Z961 Presence of intraocular lens: Secondary | ICD-10-CM | POA: Diagnosis not present

## 2014-12-03 IMAGING — CT CT HEAD W/O CM
1 of 2 series · 13 of 30 positions shown, 17 images · non-contrast
Comparison: CT head 03/20/2014, MRI 03/06/2014

CLINICAL DATA: Dysarthria

EXAM:
CT HEAD WITHOUT CONTRAST
TECHNIQUE: Contiguous axial images were obtained from the base of the skull
through the vertex without intravenous contrast.

[Series 2: headseq 4.8 h37s · axial · 0.43mm/px · z∈[+1117,+1265]mm · 13 of 36 slices shown, 17 images]
[im 3/36  brain]
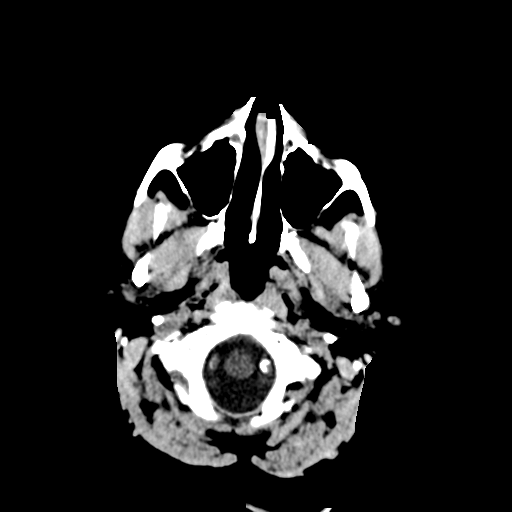
[im 3/36  bone]
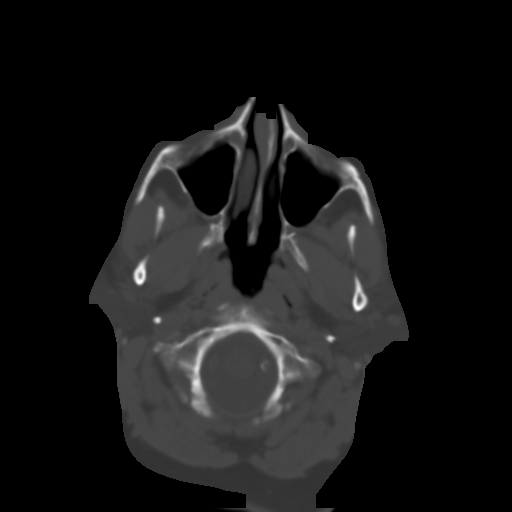
[im 6/36  brain]
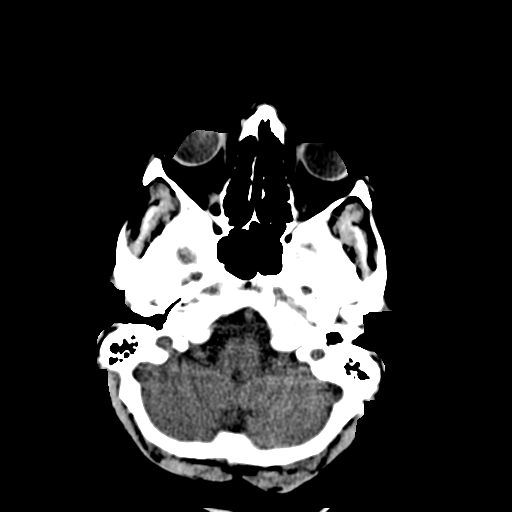
[im 8/36  brain]
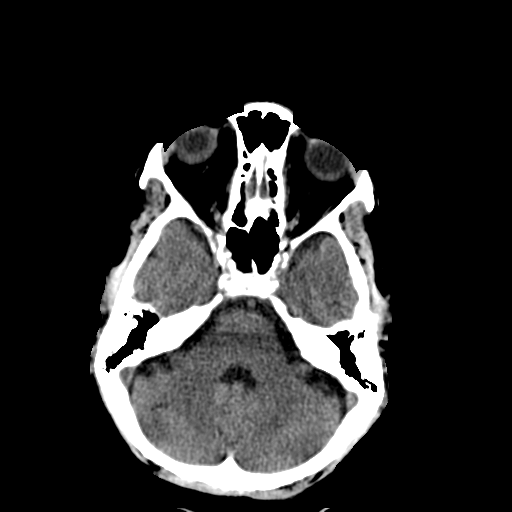
[im 11/36  brain]
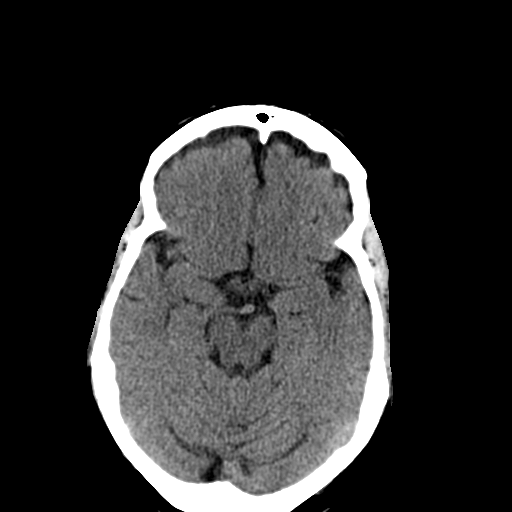
[im 13/36  brain]
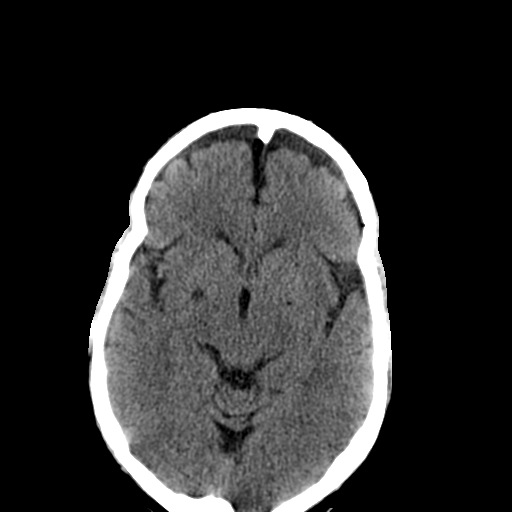
[im 13/36  bone]
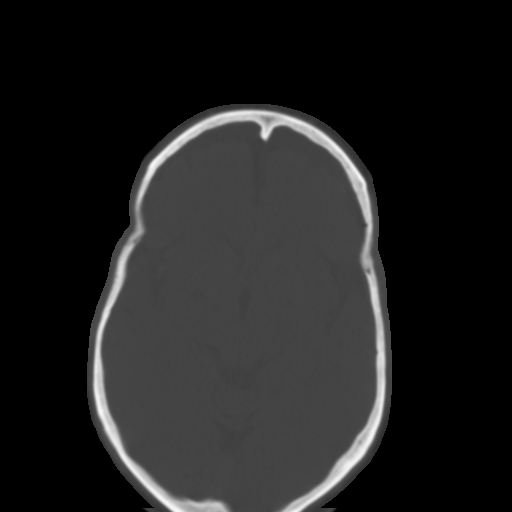
[im 16/36  brain]
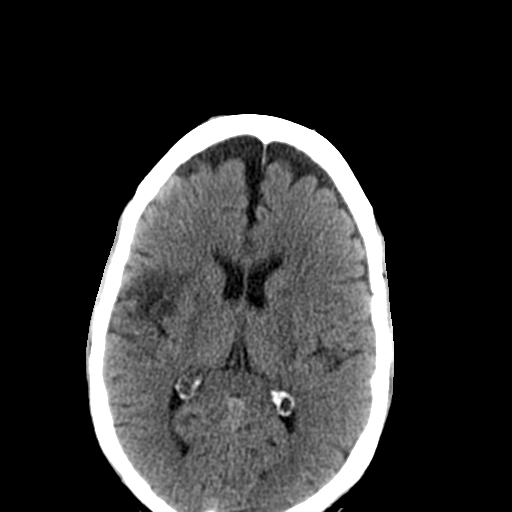
[im 18/36  brain]
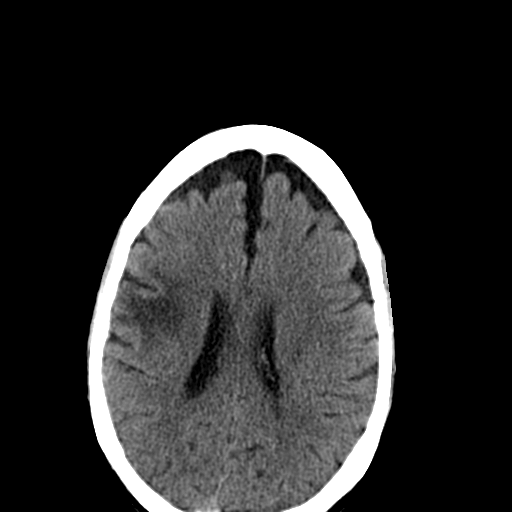
[im 21/36  brain]
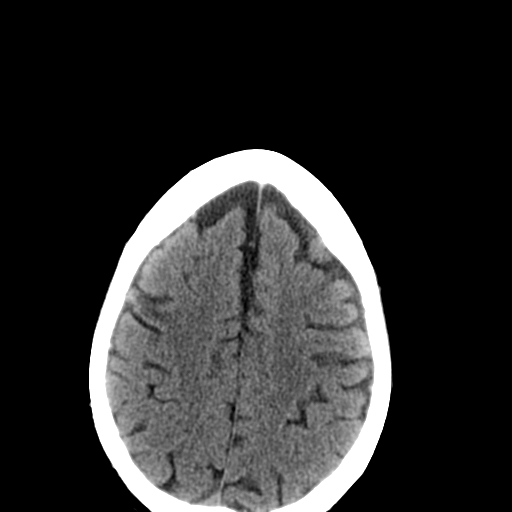
[im 23/36  brain]
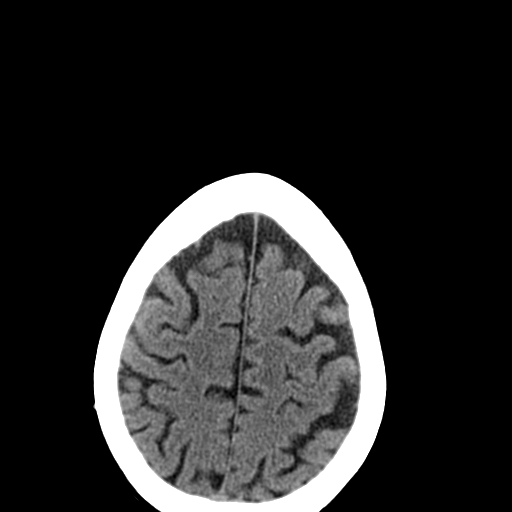
[im 23/36  bone]
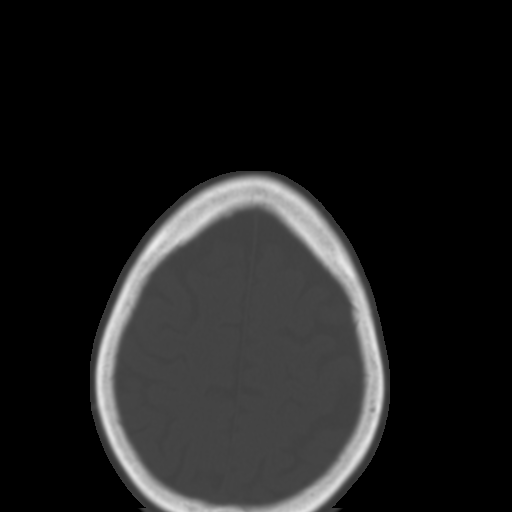
[im 26/36  brain]
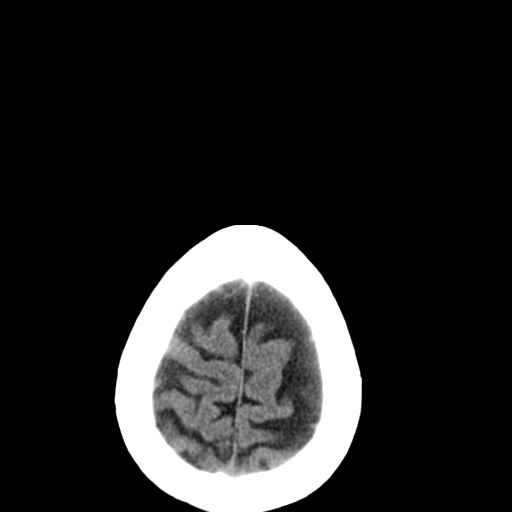
[im 28/36  brain]
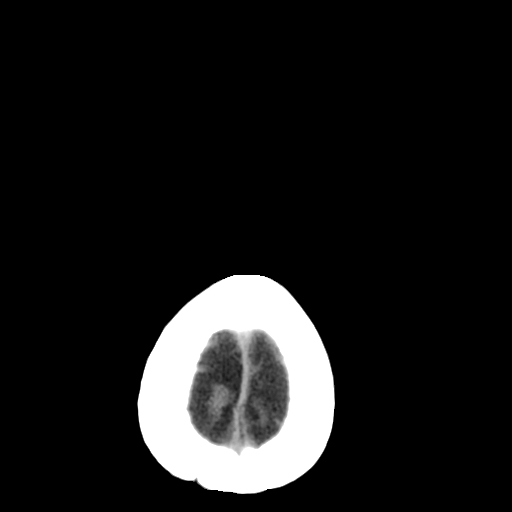
[im 31/36  brain]
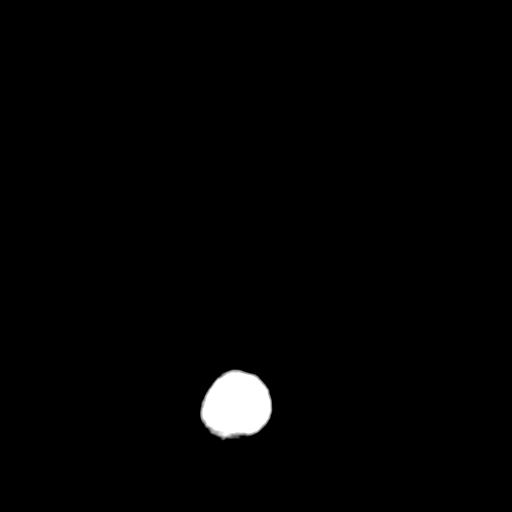
[im 33/36  brain]
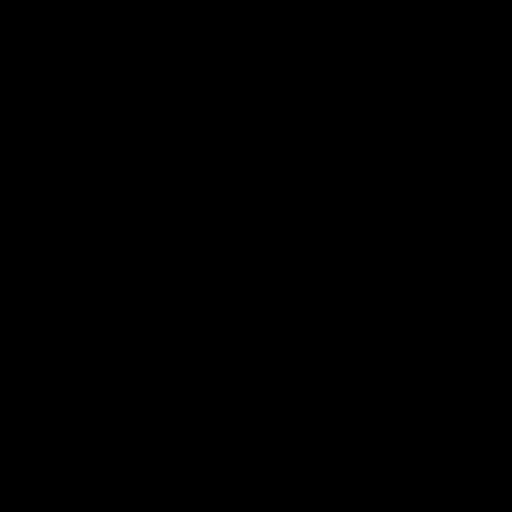
[im 33/36  bone]
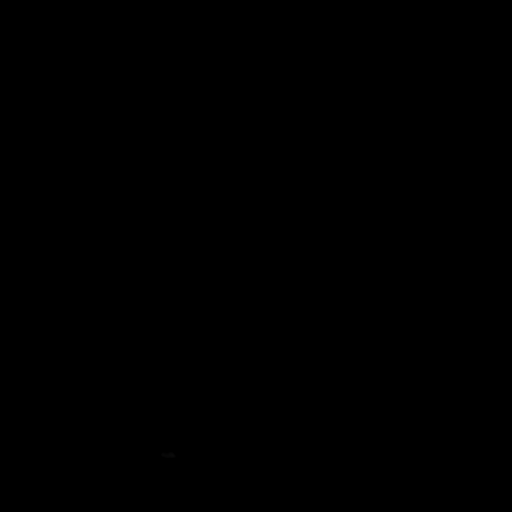

[13 of 30 positions shown; findings below may reference images not displayed]

FINDINGS: Hypodensity in the right frontal operculum and insular cortex
compatible with recent infarction. This area has become more
well-defined and more low-density compared with the prior study.
This was noted to be an acute infarct on the MRI of 03/06/2014.

Negative for hemorrhage. No new area of infarct since prior studies.
Mild chronic microvascular ischemia in the white matter. Small
chronic infarct left internal capsule. Negative for mass lesion

Ventricle size is normal. No shift of the midline structures. Benign
cyst in the right basal ganglia.
IMPRESSION: Infarct in the right frontal operculum and right insular cortex now
appears chronic.

No acute infarct or hemorrhage.

## 2014-12-06 ENCOUNTER — Other Ambulatory Visit: Payer: Self-pay

## 2014-12-06 MED ORDER — ROSUVASTATIN CALCIUM 40 MG PO TABS: 40.0000 mg | ORAL_TABLET | Freq: Every day | ORAL | Status: DC

## 2014-12-06 NOTE — Telephone Encounter (Signed)
Rx(s) sent to pharmacy electronically.  

## 2014-12-25 ENCOUNTER — Other Ambulatory Visit: Payer: Self-pay | Admitting: Internal Medicine

## 2015-01-18 ENCOUNTER — Ambulatory Visit (INDEPENDENT_AMBULATORY_CARE_PROVIDER_SITE_OTHER): Payer: Medicare Other | Admitting: Otolaryngology

## 2015-01-18 DIAGNOSIS — H7202 Central perforation of tympanic membrane, left ear: Secondary | ICD-10-CM | POA: Diagnosis not present

## 2015-01-18 DIAGNOSIS — H6122 Impacted cerumen, left ear: Secondary | ICD-10-CM

## 2015-02-02 ENCOUNTER — Other Ambulatory Visit (HOSPITAL_COMMUNITY): Payer: Self-pay | Admitting: Family Medicine

## 2015-02-02 DIAGNOSIS — Z1231 Encounter for screening mammogram for malignant neoplasm of breast: Secondary | ICD-10-CM

## 2015-02-05 ENCOUNTER — Other Ambulatory Visit: Payer: Self-pay | Admitting: Cardiology

## 2015-02-06 NOTE — Telephone Encounter (Signed)
Rx(s) sent to pharmacy electronically.  

## 2015-02-19 ENCOUNTER — Ambulatory Visit (HOSPITAL_COMMUNITY)
Admission: RE | Admit: 2015-02-19 | Discharge: 2015-02-19 | Disposition: A | Discharge status: Home / Self Care | Payer: Medicare Other | Source: Ambulatory Visit | Attending: Family Medicine | Admitting: Family Medicine

## 2015-02-19 DIAGNOSIS — Z1231 Encounter for screening mammogram for malignant neoplasm of breast: Secondary | ICD-10-CM | POA: Diagnosis present

## 2015-03-12 ENCOUNTER — Other Ambulatory Visit: Payer: Self-pay | Admitting: Internal Medicine

## 2015-03-13 ENCOUNTER — Other Ambulatory Visit: Payer: Self-pay | Admitting: *Deleted

## 2015-03-13 ENCOUNTER — Ambulatory Visit: Payer: Medicare Other | Admitting: Cardiology

## 2015-03-13 NOTE — Telephone Encounter (Signed)
REFILL 

## 2015-03-15 ENCOUNTER — Encounter (INDEPENDENT_AMBULATORY_CARE_PROVIDER_SITE_OTHER): Payer: Self-pay | Admitting: *Deleted

## 2015-03-27 IMAGING — CR DG CHEST 2V
2 series · 2 of 2 positions shown · non-contrast
Comparison: 03/06/2014

CLINICAL DATA: Acute chest pain for 2 days

EXAM:
CHEST  2 VIEW

[view not recorded (1 of 2)]
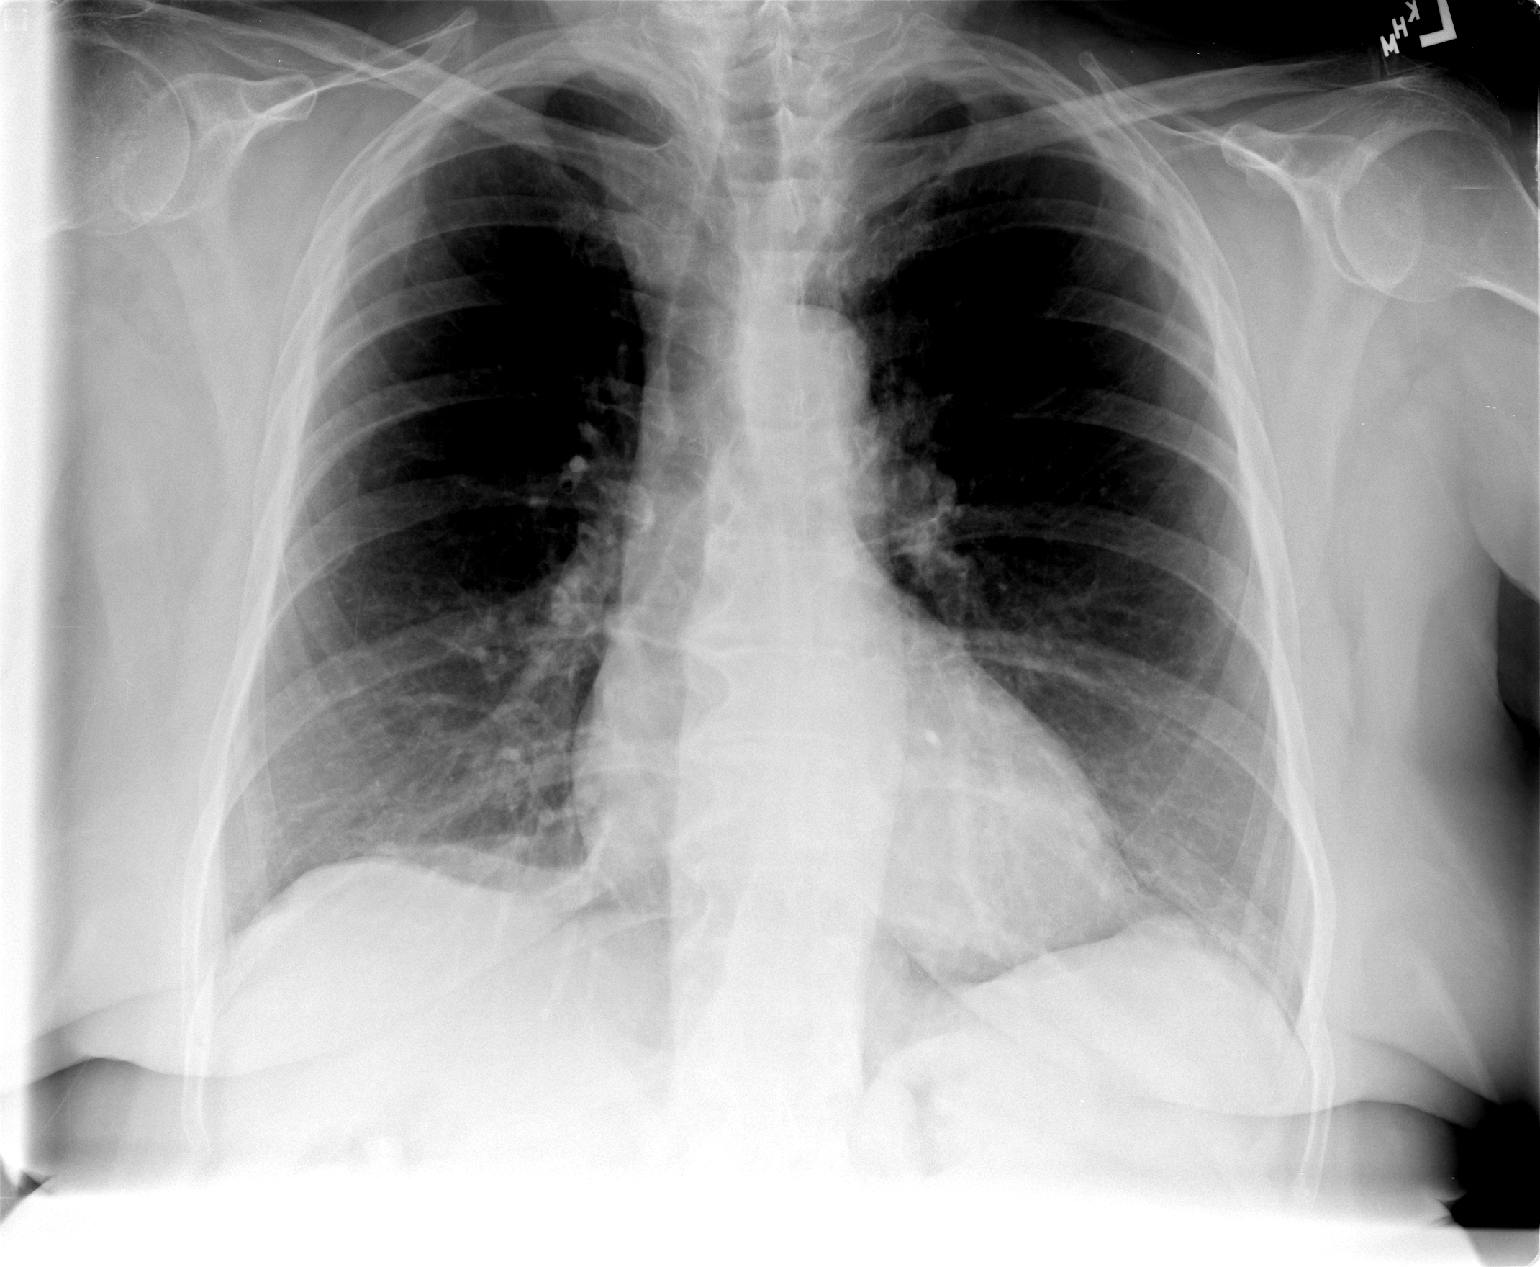

[view not recorded (2 of 2)]
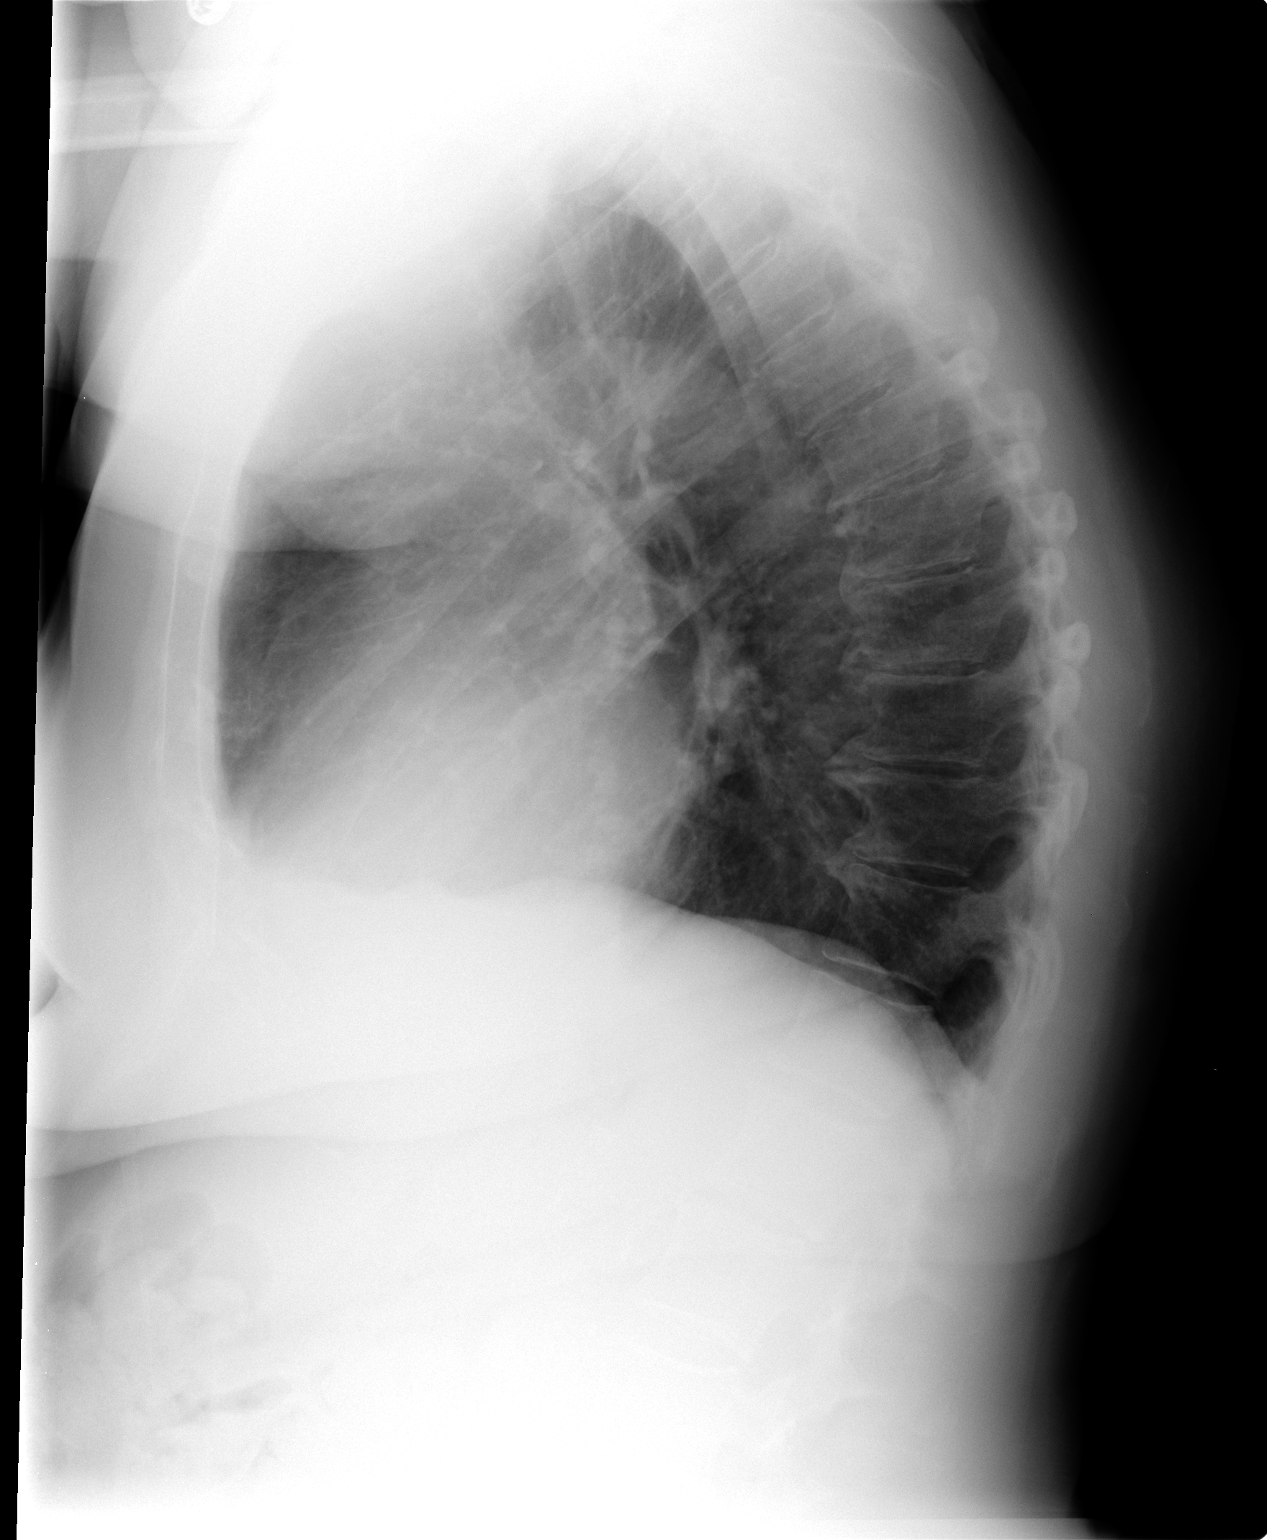

[2 of 2 positions shown; findings below may reference images not displayed]

FINDINGS: The heart size and mediastinal contours are within normal limits.
Both lungs are clear. The visualized skeletal structures are
unremarkable. Lower thoracic spondylosis and degenerative change
noted.
IMPRESSION: No active cardiopulmonary disease.

## 2015-04-02 DIAGNOSIS — L851 Acquired keratosis [keratoderma] palmaris et plantaris: Secondary | ICD-10-CM | POA: Diagnosis not present

## 2015-04-02 DIAGNOSIS — E1342 Other specified diabetes mellitus with diabetic polyneuropathy: Secondary | ICD-10-CM | POA: Diagnosis not present

## 2015-04-02 DIAGNOSIS — M79674 Pain in right toe(s): Secondary | ICD-10-CM | POA: Diagnosis not present

## 2015-04-02 DIAGNOSIS — B351 Tinea unguium: Secondary | ICD-10-CM | POA: Diagnosis not present

## 2015-04-09 ENCOUNTER — Ambulatory Visit (INDEPENDENT_AMBULATORY_CARE_PROVIDER_SITE_OTHER): Payer: Medicare Other | Admitting: Cardiology

## 2015-04-09 ENCOUNTER — Encounter: Payer: Self-pay | Admitting: Cardiology

## 2015-04-09 VITALS — BP 112/58 | HR 73 | Ht 63.0 in | Wt 174.8 lb

## 2015-04-09 DIAGNOSIS — I2510 Atherosclerotic heart disease of native coronary artery without angina pectoris: Secondary | ICD-10-CM

## 2015-04-09 DIAGNOSIS — R002 Palpitations: Secondary | ICD-10-CM | POA: Diagnosis not present

## 2015-04-09 DIAGNOSIS — I6523 Occlusion and stenosis of bilateral carotid arteries: Secondary | ICD-10-CM

## 2015-04-09 NOTE — Progress Notes (Signed)
Patient ID: Aura Camps Gingerich, female   DOB: 1940/11/04, 74 y.o.   MRN: [id number]     Clinical Summary Ms. Brazzel is a 74 y.o.female last seen by PA Dorene Ar, this is our first visit together. She is seen for the following medical problems.   1. Palpitaions - previous monitor showed infrequent PACs. Clinically from notes suspected PSVT but does not appear ever captured by monitoring.  - denies any recent symptoms  2. Hx of CVA - approx 1 year, recovered  3. CAD - history of MI in 1995, received 2 stents to RCA - last cath 2007 with patent coronaries.  - no chest pain, no SOB or DOE - was on valsartan but had low bp and stopped   4. Carotid stenosis - mild to moderate bilateral disease by carotid US 02/2014. - no recent neuro symptoms    Past Medical History  Diagnosis Date  . Back pain   . GERD (gastroesophageal reflux disease)   . MI (myocardial infarction)   . Hypercholesteremia   . Anemia   . Cirrhosis   . Aortic sclerosis 07/04/2011  . Aortic insufficiency 07/04/2011    most recent 2D Echo 02/24/2012  EF greater than 55%  . Carotid stenosis, bilateral 07/04/2011    most recent 05/2010 carotid doppler  . Presence of stent in right coronary artery 07/04/2011  . HTN (hypertension) 07/04/2011  . Coronary artery disease   . Myocardial infarction 1995  . GERD (gastroesophageal reflux disease)   . Non-alcoholic fatty liver disease   . UTI (lower urinary tract infection)     chronic/recurring/on bactrim  . CVA (cerebral infarction)     03/07/2014  . Hiatal hernia   . IBS (irritable bowel syndrome)   . Diabetes mellitus 2005  . Varices, esophageal   . Stroke 03/07/2014    no deficits from stroke  . OSA (obstructive sleep apnea) 07/04/2011    sleep study 2009  AHI 9.91/hr and during REM sleep 35.58/hr     Allergies  Allergen Reactions  . Tape Rash    ADHESIVE TAPE     Current Outpatient Prescriptions  Medication Sig Dispense Refill  . aspirin 81 MG tablet Take  81 mg by mouth daily.    . carvedilol (COREG) 12.5 MG tablet Take 1 tablet (12.5 mg total) by mouth 2 (two) times daily with a meal. NEED OV. 60 tablet 0  . clopidogrel (PLAVIX) 75 MG tablet Take 75 mg by mouth daily.    Marland Kitchen HUMULIN N KWIKPEN 100 UNIT/ML Kiwkpen Inject 50 Units into the skin 2 (two) times daily.     . insulin glargine (LANTUS SOLOSTAR) 100 UNIT/ML injection Inject 50 Units into the skin at bedtime.     . isosorbide mononitrate (IMDUR) 30 MG 24 hr tablet Take 30 mg by mouth daily.    . isosorbide mononitrate (IMDUR) 30 MG 24 hr tablet Take 1 tablet (30 mg total) by mouth daily. <PLEASE MAKE APPOINTMENT FOR REFILLS> 30 tablet 0  . omeprazole (PRILOSEC OTC) 20 MG tablet Take 20 mg by mouth daily.    . rosuvastatin (CRESTOR) 40 MG tablet Take 1 tablet (40 mg total) by mouth at bedtime. NEEDS APPOINTMENT FOR FUTURE REFILLS 30 tablet 0  . sulfamethoxazole-trimethoprim (BACTRIM DS,SEPTRA DS) 800-160 MG per tablet Take 1 tablet by mouth 2 (two) times daily.    Marland Kitchen zolpidem (AMBIEN) 10 MG tablet Take 1 tablet by mouth at bedtime.     No current facility-administered medications for this visit.  Past Surgical History  Procedure Laterality Date  . Back surgery  1985  . Tonsillectomy    . Appendectomy    . Colonoscopy    . Eye surgery  08    cataract surgery of the left eye  . Ercp    . Myringotomy  2012    both ears  . Hardware removal Right 01/17/2013    Procedure: REMOVAL OF HARDWARE AND EXCISION ULNAR STYLOID RIGHT WRIST;  Surgeon: Tennis Must, MD;  Location: [facility];  Service: Orthopedics;  Laterality: Right;  . Wrist surgery      rt wrist hardwear removal  . Esophagogastroduodenoscopy N/A 04/01/2013    Procedure: ESOPHAGOGASTRODUODENOSCOPY (EGD);  Surgeon: Rogene Houston, MD;  [facility];  Service: Endoscopy;  Laterality: N/A;  230-rescheduled to 8:30am Ann notified pt  . Esophageal banding N/A 04/01/2013    Procedure: ESOPHAGEAL BANDING;   Surgeon: Rogene Houston, MD;  [facility];  Service: Endoscopy;  Laterality: N/A;  . Esophagogastroduodenoscopy N/A 05/24/2013    Procedure: ESOPHAGOGASTRODUODENOSCOPY (EGD);  Surgeon: Rogene Houston, MD;  [facility];  Service: Endoscopy;  Laterality: N/A;  730  . Esophageal banding N/A 05/24/2013    Procedure: ESOPHAGEAL BANDING;  Surgeon: Rogene Houston, MD;  [facility];  Service: Endoscopy;  Laterality: N/A;  . Dilation and curettage of uterus      x2  . Vaginal hysterectomy  1972  . Anal fissure repair  1972  . Epi retinal membrane peel Left   . Esophagogastroduodenoscopy N/A 06/21/2014    Procedure: ESOPHAGOGASTRODUODENOSCOPY (EGD);  Surgeon: Rogene Houston, MD;  [facility];  Service: Endoscopy;  Laterality: N/A;  930-rescheduled 10/14 @ 1200 Ann to notify pt  . Esophageal banding N/A 06/21/2014    Procedure: ESOPHAGEAL BANDING;  Surgeon: Rogene Houston, MD;  [facility];  Service: Endoscopy;  Laterality: N/A;  . Cardiac catheterization  1995.,2006,1997    stent to the proximal RCA after MI   . Cataract extraction w/phaco Right 07/04/2014    Procedure: CATARACT EXTRACTION PHACO AND INTRAOCULAR LENS PLACEMENT (IOC);  Surgeon: Elta Guadeloupe T. Gershon Crane, MD;  Location: [facility];  Service: Ophthalmology;  Laterality: Right;  CDE:13.13     Allergies  Allergen Reactions  . Tape Rash    ADHESIVE TAPE      Family History  Problem Relation Age of Onset  . Diabetes Mother   . Heart failure Father      Social History Ms. Suliman reports that she quit smoking about 3 years ago. Her smoking use included Cigarettes. She has a 12.5 pack-year smoking history. She has never used smokeless tobacco. Ms. Aylesworth reports that she does not drink alcohol.   Review of Systems CONSTITUTIONAL: No weight loss, fever, chills, weakness or fatigue.  HEENT: Eyes: No visual loss, blurred vision, double vision or yellow sclerae.No hearing loss, sneezing,  congestion, runny nose or sore throat.  SKIN: No rash or itching.  CARDIOVASCULAR: per HPI RESPIRATORY: No shortness of breath, cough or sputum.  GASTROINTESTINAL: No anorexia, nausea, vomiting or diarrhea. No abdominal pain or blood.  GENITOURINARY: No burning on urination, no polyuria NEUROLOGICAL: No headache, dizziness, syncope, paralysis, ataxia, numbness or tingling in the extremities. No change in bowel or bladder control.  MUSCULOSKELETAL: No muscle, back pain, joint pain or stiffness.  LYMPHATICS: No enlarged nodes. No history of splenectomy.  PSYCHIATRIC: No history of depression or anxiety.  ENDOCRINOLOGIC: No reports of sweating,  cold or heat intolerance. No polyuria or polydipsia.  Marland Kitchen   Physical Examination Filed Vitals:   04/09/15 1526  BP: 112/58  Pulse: 73   Filed Vitals:   04/09/15 1526  Height: 5' 3"  (1.6 m)  Weight: 174 lb 12.8 oz (79.289 kg)    Gen: resting comfortably, no acute distress HEENT: no scleral icterus, pupils equal round and reactive, no palptable cervical adenopathy,  CV: CTAB Resp: Clear to auscultation bilaterally GI: abdomen is soft, non-tender, non-distended, normal bowel sounds, no hepatosplenomegaly MSK: extremities are warm, no edema.  Skin: warm, no rash Neuro:  no focal deficits Psych: appropriate affect   Diagnostic Studies  02/2014 echo Study Conclusions  - Left ventricle: The cavity size was normal. Wall thickness was increased in a pattern of mild LVH. Systolic function was vigorous. The estimated ejection fraction was in the range of 70% to 75%. Wall motion was normal; there were no regional wall motion abnormalities. Doppler parameters are consistent with abnormal left ventricular relaxation (grade 1 diastolic dysfunction). - Aortic valve: Mildly to moderately calcified annulus. Trileaflet; moderately calcified leaflets. There was mild regurgitation. Mean gradient (S): 9 mm Hg. - Mitral valve: Severely  calcified posterior annulus. There was trivial regurgitation. Mean gradient (D): 4 mm Hg. Valve area by pressure half-time: 2.04 cm^2. - Left atrium: The atrium was mildly to moderately dilated. - Right atrium: Central venous pressure (est): 3 mm Hg. - Atrial septum: A patent foramen ovale cannot be excluded. - Tricuspid valve: There was trivial regurgitation. - Pulmonary arteries: Systolic pressure could not be accurately estimated. - Pericardium, extracardiac: There was no pericardial effusion.  Impressions:  - Mild LVH with LVEF 20-94%, grade 1 diastolic dysfunction. Severe posterior MAC with trivial mitral regurgitation and borderline mild mitral stenosis. Mild to moderate left atrial enlargement. Moderately sclerotic aortic valve with mild aortic regurgitation, no aortic stenosis. Unable to assess PASP. Cannot exclude PFO.   02/2014 Carotid US RIGHT CAROTID ARTERY: A mild amount of predominately calcified plaque is present at the level of the distal bulb and proximal ICA. Proximal velocities and waveforms are unremarkable and estimated ICA stenosis is less than 50%. Mildly elevated velocities in the higher internal carotid segment within the upper neck noted at a level of mild tortuosity without focal plaque.  RIGHT VERTEBRAL ARTERY: Antegrade flow with normal waveform and velocity.  LEFT CAROTID ARTERY: Stable moderate plaque burden at the level of the distal bulb and extending up to the ICA origin. This causes luminal stenosis with turbulent flow. Estimated proximal left ICA stenosis is 50- 69%.  LEFT VERTEBRAL ARTERY: Antegrade flow with normal waveform and velocity.  IMPRESSION: Bilateral atherosclerotic plaque at the level of the distal carotid bulbs and proximal internal carotid arteries. Estimated right ICA stenosis is less than 50%. Estimated left ICA stenosis is 50- 69%.  07/2006 cath HEMODYNAMIC DATA: The left ventricle had a pressure of  142/2 with an end diastolic pressure of 9 mmHg. The aortic pressure was 141/60 with a mean of 93 mmHg. There was no pressure gradient across the aortic valve.  ANGIOGRAPHIC DATA: Left ventricle: The left ventricular systolic function was normal with an ejection fraction of 60-65%.  Right coronary artery: The right coronary artery is a large caliber vessel and dominant. There is mild 10% luminal irregularity in the mid- segment of the right coronary artery. Otherwise the RCA is large and smooth.  Left main: The left main is a large caliber vessel. Smooth and normal.  LAD: The LAD shows mild  calcification of the proximal segment. Otherwise there is no significant luminal obstruction. It gives origin to a very small diagonal one and a moderate sized diagonal two.  The ramus intermedius: The ramus intermedius is a moderate caliber vessel. The ostium shows about 10% mild luminal irregularity. This is significantly improved compared to cardiac catheterization done a year ago where it was at least 70%, if not more, stenosed.  Circumflex: The circumflex is a large caliber vessel. It is smooth and has very minimal luminal irregularity.  ASCENDING AORTAGRAM: The ascending aortogram revealed the presence of three aortic valve cusps. There is no aortic ascending aneurysm, aortic regurgitation or aortic dissection.  ABDOMINAL AORTOGRAM: Abdominal aortogram revealed the presence of two renal arteries, one on either side. They are widely patent. The left renal artery showed a mild calcification with around 10% luminal stenosis but no pressure gradient across the renal artery.  IMPRESSION: No significant coronary artery disease by cardiac catheterization. Actually the ramus intermedius, which had at least a 70% stenosis a year ago now appears to have less than 10-20% stenosis at most and there is significant regression of  atherosclerotic plaque with aggressive medical therapy. Overall I suspect that the chest pain is secondary to non cardiac causes, probably suspect GERD, given the fact that she has recurrent chest discomfort in lying down position. She also has body habitus with obesity and GERD should be a primary consideration. She will be discharged home today with PPI; however, if the chest pain persists, evaluation for non cardiac cause of chest pain is indicated.  A total of 120 mL of contrast was utilized for diagnostic angiography.  TECHNIQUES OF THE PROCEDURE: Under the usual sterile precautions, using a 6-French right femoral venous access and a 6-French multipurpose B2 catheter was advanced to the ascending aorta, a 0.025 JR. The catheter was gently advanced to the left ventricle. The left ventricular pressure monitored. Contrast was injected in the left ventricle performed in both the LAO and the RAO projection. The catheter was flushed and gently pulled back. The pressure gradient across the aortic valve was monitored. Then the right coronary artery was selectively engaged and angiography was performed. Then the left coronary artery was selectively engaged and angiography was performed. Then the catheter was pulled into the root of the aorta and ascending aortogram was performed in the LAO projection. Then the catheter was pulled back into the abdominal aorta and abdominal aortogram was performed. Right femoral angiography was performed through the arterial access sheath and the access was closed with StarClose with excellent hemostasis. The patient tolerated the procedure. No immediate complications were noted.    Assessment and Plan  1. Palpitations - no recent symptoms, continue coreg  2. Hx of CVA - continue ASA and statin for secondary prevention. ARB previously stopped due to low bp  3. CAD - no current symptoms, continue risk factor  modification and secondary prevention  4. Carotid stenosis - repeat carotid US  F/u 1 year      Arnoldo Lenis, M.D.

## 2015-04-09 NOTE — Patient Instructions (Signed)
Your physician wants you to follow-up in: 1 year with DrBranch You will receive a reminder letter in the mail two months in advance. If you don't receive a letter, please call our office to schedule the follow-up appointment.    Your physician recommends that you continue on your current medications as directed. Please refer to the Current Medication list given to you today.    Your physician has requested that you have a carotid duplex. This test is an ultrasound of the carotid arteries in your neck. It looks at blood flow through these arteries that supply the brain with blood. Allow one hour for this exam. There are no restrictions or special instructions.     Thank you for choosing [facility] !

## 2015-04-10 DIAGNOSIS — R471 Dysarthria and anarthria: Secondary | ICD-10-CM | POA: Diagnosis not present

## 2015-04-10 DIAGNOSIS — R1310 Dysphagia, unspecified: Secondary | ICD-10-CM | POA: Diagnosis not present

## 2015-04-10 DIAGNOSIS — I10 Essential (primary) hypertension: Secondary | ICD-10-CM | POA: Diagnosis not present

## 2015-04-10 DIAGNOSIS — I6990 Unspecified sequelae of unspecified cerebrovascular disease: Secondary | ICD-10-CM | POA: Diagnosis not present

## 2015-04-16 ENCOUNTER — Ambulatory Visit (HOSPITAL_COMMUNITY)
Admission: RE | Admit: 2015-04-16 | Discharge: 2015-04-16 | Disposition: A | Discharge status: Home / Self Care | Payer: Medicare Other | Source: Ambulatory Visit | Attending: Cardiology | Admitting: Cardiology

## 2015-04-16 DIAGNOSIS — I6523 Occlusion and stenosis of bilateral carotid arteries: Secondary | ICD-10-CM | POA: Insufficient documentation

## 2015-04-16 DIAGNOSIS — E119 Type 2 diabetes mellitus without complications: Secondary | ICD-10-CM | POA: Insufficient documentation

## 2015-04-16 DIAGNOSIS — I2510 Atherosclerotic heart disease of native coronary artery without angina pectoris: Secondary | ICD-10-CM | POA: Diagnosis not present

## 2015-04-16 DIAGNOSIS — E785 Hyperlipidemia, unspecified: Secondary | ICD-10-CM | POA: Diagnosis not present

## 2015-04-16 DIAGNOSIS — I10 Essential (primary) hypertension: Secondary | ICD-10-CM | POA: Insufficient documentation

## 2015-04-16 DIAGNOSIS — Z8673 Personal history of transient ischemic attack (TIA), and cerebral infarction without residual deficits: Secondary | ICD-10-CM | POA: Insufficient documentation

## 2015-04-18 ENCOUNTER — Telehealth: Payer: Self-pay | Admitting: *Deleted

## 2015-04-18 NOTE — Telephone Encounter (Signed)
Faxed signed "physician/provider query" r/t office visit on 12/09/2013 asking for clarification on obesity - no further clarification

## 2015-04-19 ENCOUNTER — Other Ambulatory Visit (INDEPENDENT_AMBULATORY_CARE_PROVIDER_SITE_OTHER): Payer: Self-pay | Admitting: Internal Medicine

## 2015-04-19 ENCOUNTER — Ambulatory Visit (INDEPENDENT_AMBULATORY_CARE_PROVIDER_SITE_OTHER): Payer: Medicare Other | Admitting: Internal Medicine

## 2015-04-19 ENCOUNTER — Encounter (INDEPENDENT_AMBULATORY_CARE_PROVIDER_SITE_OTHER): Payer: Self-pay | Admitting: Internal Medicine

## 2015-04-19 ENCOUNTER — Encounter (INDEPENDENT_AMBULATORY_CARE_PROVIDER_SITE_OTHER): Payer: Self-pay | Admitting: *Deleted

## 2015-04-19 VITALS — BP 124/40 | HR 66 | Temp 97.4°F | Ht 63.0 in | Wt 175.1 lb

## 2015-04-19 DIAGNOSIS — K7460 Unspecified cirrhosis of liver: Secondary | ICD-10-CM

## 2015-04-19 MED ORDER — ZOLPIDEM TARTRATE 10 MG PO TABS: 10.0000 mg | ORAL_TABLET | Freq: Every day | ORAL | Status: DC

## 2015-04-19 NOTE — Patient Instructions (Signed)
OV in 6 months

## 2015-04-19 NOTE — Progress Notes (Signed)
Subjective:    Patient ID: [name], female    DOB: August 09, 1941, 74 y.o.   MRN: [id number] HPI  Here today for f/u of her cirrhosis. She was last seen in February of this year.  She has a hx of esophageal bleed in 2014 She underwent an esophageal in October 2015 (see below) She has lost 5 pounds since her last visit in February which was intentional. She tells me she is doing well. Her husband is in a nursing home.   She is walking some. Her liver numbers are normal.  Appetite is good. Stools are brown in color. No melena or BRRB. No abdominal pain.' Denies confusion. Her last US abdomen was in February.       CBC    Component Value Date/Time   WBC 5.9 09/26/2014 1022   RBC 4.10 09/26/2014 1022   HGB 12.1 09/26/2014 1022   HCT 36.2 09/26/2014 1022   PLT 128* 09/26/2014 1022   MCV 88.3 09/26/2014 1022   MCH 29.5 09/26/2014 1022   MCHC 33.4 09/26/2014 1022   RDW 14.6 09/26/2014 1022   LYMPHSABS 1.1 09/03/2014 1250   MONOABS 0.6 09/03/2014 1250   EOSABS 0.2 09/03/2014 1250   BASOSABS 0.0 09/03/2014 1250    Hepatic Function Panel     Component Value Date/Time   PROT 6.3 10/09/2014 1440   ALBUMIN 3.8 10/09/2014 1440   AST 21 10/09/2014 1440   ALT 16 10/09/2014 1440   ALKPHOS 97 10/09/2014 1440   BILITOT 0.5 10/09/2014 1440   BILIDIR 0.1 10/09/2014 1440   IBILI 0.4 10/09/2014 1440         10/12/2014 US abdomen:  IMPRESSION: 1. Splenic echotexture consistent with known cirrhosis. Similar findings noted on prior exam. 2. Splenomegaly. No acute abnormality identified to    06/21/2014: Procedure: EGD with esophageal variceal banding.  Indications: Patient is 74 year old Caucasian female with cirrhosis secondary to half old complicated by esophageal variceal bleed last year. She is now returning for EGD and banding for recurrent varices. She was hospitalized about 3 months ago in order to have heme positive stool and low hemoglobin but there is no  history of melena or rectal bleeding. Hemoglobin 7 weeks ago was up to 11.8 g. Patient is on low-dose aspirin because of history of CVA(4 months ago)  Impression: Two columns of esophageal varices were banded. Portal gastropath Gastric antral vascular ectasia without stigmata of bleed.        Review of Systems Past Medical History  Diagnosis Date  . Back pain   . GERD (gastroesophageal reflux disease)   . MI (myocardial infarction)   . Hypercholesteremia   . Anemia   . Cirrhosis   . Aortic sclerosis 07/04/2011  . Aortic insufficiency 07/04/2011    most recent 2D Echo 02/24/2012  EF greater than 55%  . Carotid stenosis, bilateral 07/04/2011    most recent 05/2010 carotid doppler  . Presence of stent in right coronary artery 07/04/2011  . HTN (hypertension) 07/04/2011  . Coronary artery disease   . Myocardial infarction 1995  . GERD (gastroesophageal reflux disease)   . Non-alcoholic fatty liver disease   . UTI (lower urinary tract infection)     chronic/recurring/on bactrim  . CVA (cerebral infarction)     03/07/2014  . Hiatal hernia   . IBS (irritable bowel syndrome)   . Diabetes mellitus 2005  . Varices, esophageal   . Stroke 03/07/2014    no deficits from stroke  . OSA (obstructive sleep  apnea) 07/04/2011    sleep study 2009  AHI 9.91/hr and during REM sleep 35.58/hr    Past Surgical History  Procedure Laterality Date  . Back surgery  1985  . Tonsillectomy    . Appendectomy    . Colonoscopy    . Eye surgery  08    cataract surgery of the left eye  . Ercp    . Myringotomy  2012    both ears  . Hardware removal Right 01/17/2013    Procedure: REMOVAL OF HARDWARE AND EXCISION ULNAR STYLOID RIGHT WRIST;  Surgeon: Tennis Must, MD;  Location: [place];  Service: Orthopedics;  Laterality: Right;  . Wrist surgery      rt wrist hardwear removal  . Esophagogastroduodenoscopy N/A 04/01/2013    Procedure:  ESOPHAGOGASTRODUODENOSCOPY (EGD);  Surgeon: Rogene Houston, MD;  [facility];  Service: Endoscopy;  Laterality: N/A;  230-rescheduled to 8:30am Ann notified pt  . Esophageal banding N/A 04/01/2013    Procedure: ESOPHAGEAL BANDING;  Surgeon: Rogene Houston, MD;  [facility];  Service: Endoscopy;  Laterality: N/A;  . Esophagogastroduodenoscopy N/A 05/24/2013    Procedure: ESOPHAGOGASTRODUODENOSCOPY (EGD);  Surgeon: Rogene Houston, MD;  [facility];  Service: Endoscopy;  Laterality: N/A;  730  . Esophageal banding N/A 05/24/2013    Procedure: ESOPHAGEAL BANDING;  Surgeon: Rogene Houston, MD;  [facility];  Service: Endoscopy;  Laterality: N/A;  . Dilation and curettage of uterus      x2  . Vaginal hysterectomy  1972  . Anal fissure repair  1972  . Epi retinal membrane peel Left   . Esophagogastroduodenoscopy N/A 06/21/2014    Procedure: ESOPHAGOGASTRODUODENOSCOPY (EGD);  Surgeon: Rogene Houston, MD;  [facility];  Service: Endoscopy;  Laterality: N/A;  930-rescheduled 10/14 @ 1200 Ann to notify pt  . Esophageal banding N/A 06/21/2014    Procedure: ESOPHAGEAL BANDING;  Surgeon: Rogene Houston, MD;  [facility];  Service: Endoscopy;  Laterality: N/A;  . Cardiac catheterization  1995.,2006,1997    stent to the proximal RCA after MI   . Cataract extraction w/phaco Right 07/04/2014    Procedure: CATARACT EXTRACTION PHACO AND INTRAOCULAR LENS PLACEMENT (IOC);  Surgeon: Elta Guadeloupe T. Gershon Crane, MD;  Location: [facility];  Service: Ophthalmology;  Laterality: Right;  CDE:13.13    Allergies  Allergen Reactions  . Tape Rash    ADHESIVE TAPE    Current Outpatient Prescriptions on File Prior to Visit  Medication Sig Dispense Refill  . aspirin 81 MG tablet Take 81 mg by mouth daily.    . carvedilol (COREG) 6.25 MG tablet Take 6.25 mg by mouth 2 (two) times daily with a meal.    . HUMULIN N KWIKPEN 100 UNIT/ML Kiwkpen Inject 50 Units into  the skin 2 (two) times daily.     . insulin glargine (LANTUS SOLOSTAR) 100 UNIT/ML injection Inject 50 Units into the skin at bedtime.     . isosorbide mononitrate (IMDUR) 30 MG 24 hr tablet Take 30 mg by mouth daily.    Marland Kitchen omeprazole (PRILOSEC OTC) 20 MG tablet Take 20 mg by mouth daily.    . rosuvastatin (CRESTOR) 40 MG tablet Take 1 tablet (40 mg total) by mouth at bedtime. NEEDS APPOINTMENT FOR FUTURE REFILLS 30 tablet 0  . zolpidem (AMBIEN) 10 MG tablet Take 1 tablet by mouth at bedtime.    . sulfamethoxazole-trimethoprim (BACTRIM DS,SEPTRA DS) 800-160 MG per tablet Take  1 tablet by mouth 2 (two) times daily.     No current facility-administered medications on file prior to visit.        Objective:   Physical Exam Blood pressure 124/40, pulse 66, temperature 97.4 F (36.3 C), height 5' 3"  (1.6 m), weight 175 lb 1.6 oz (79.425 kg).  Alert and oriented. Skin warm and dry. Oral mucosa is moist.   . Sclera anicteric, conjunctivae is pink. Thyroid not enlarged. No cervical lymphadenopathy. Lungs clear. Heart regular rate and rhythm.  Abdomen is soft. Bowel sounds are positive. No hepatomegaly. No abdominal masses felt. No tenderness.  No edema to lower extremities.         Assessment & Plan:  Cirrhosis. She seems to be doing well. Due for [place] RUQ and CBC, Hepatic function, AFP, PT/INR. OV in 6 months.

## 2015-04-20 LAB — HEPATIC FUNCTION PANEL
ALK PHOS: 93 U/L (ref 33–130)
ALT: 21 U/L (ref 6–29)
AST: 24 U/L (ref 10–35)
Albumin: 3.6 g/dL (ref 3.6–5.1)
BILIRUBIN TOTAL: 0.5 mg/dL (ref 0.2–1.2)
Bilirubin, Direct: 0.1 mg/dL (ref <=0.2)
Indirect Bilirubin: 0.4 mg/dL (ref 0.2–1.2)
Total Protein: 6.1 g/dL (ref 6.1–8.1)

## 2015-04-20 LAB — PROTIME-INR
INR: 1.15 (ref <1.50)
Prothrombin Time: 14.7 seconds (ref 11.6–15.2)

## 2015-04-20 LAB — AFP TUMOR MARKER: AFP-Tumor Marker: 8.3 ng/mL — ABNORMAL HIGH (ref <6.1)

## 2015-04-20 NOTE — Telephone Encounter (Signed)
Has already been filled

## 2015-04-24 DIAGNOSIS — Z961 Presence of intraocular lens: Secondary | ICD-10-CM | POA: Diagnosis not present

## 2015-05-05 IMAGING — US US ABDOMEN COMPLETE
1 series · 14 of 25 positions shown · non-contrast
Comparison: 04/04/2013.

CLINICAL DATA: History is cirrhosis.

EXAM:
ULTRASOUND ABDOMEN COMPLETE

[Series 1: us abdomen complete · 0.21mm/px · 14 of 123 slices shown]
[im 1/123]
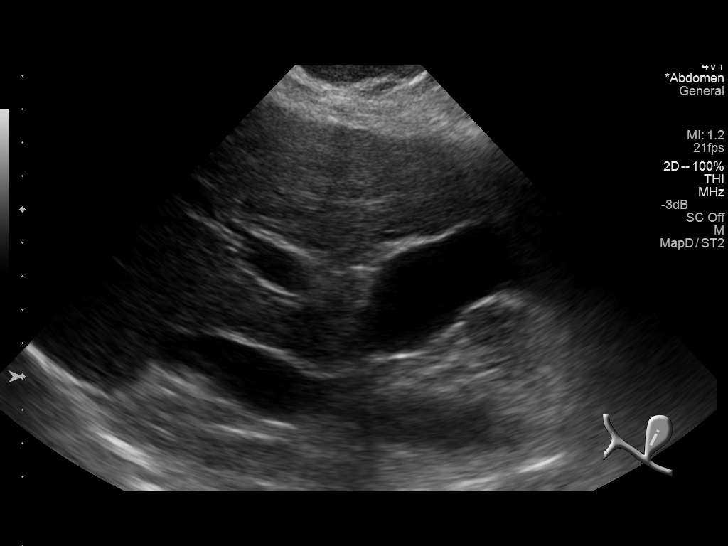
[im 11/123]
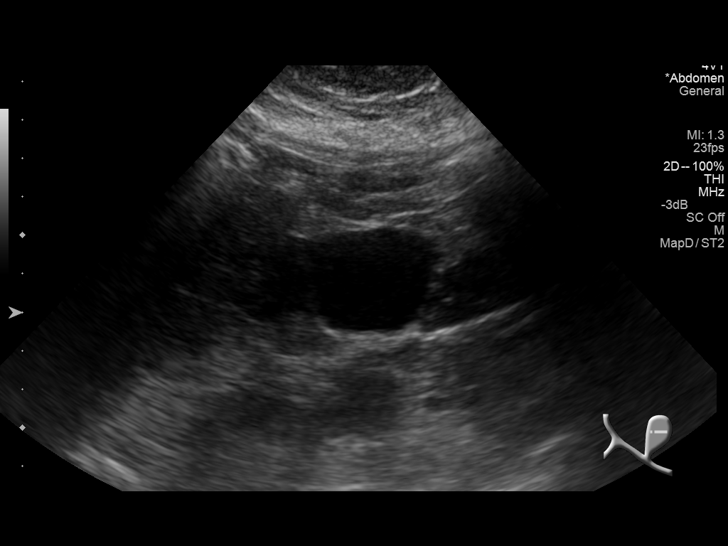
[im 21/123]
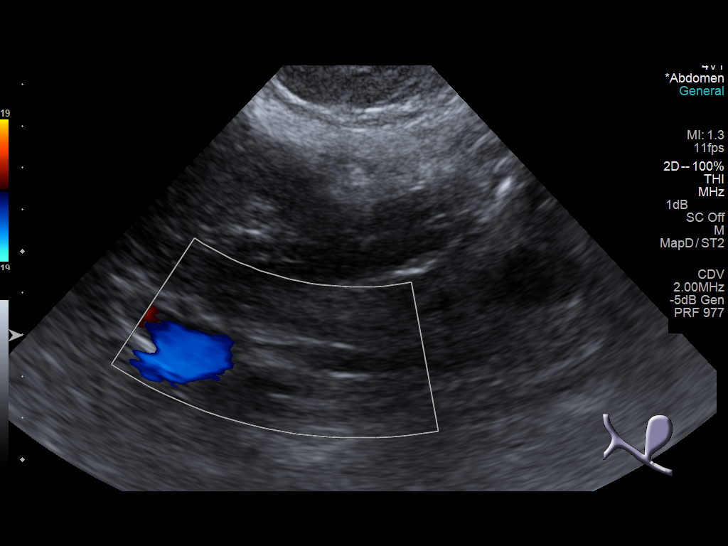
[im 31/123]
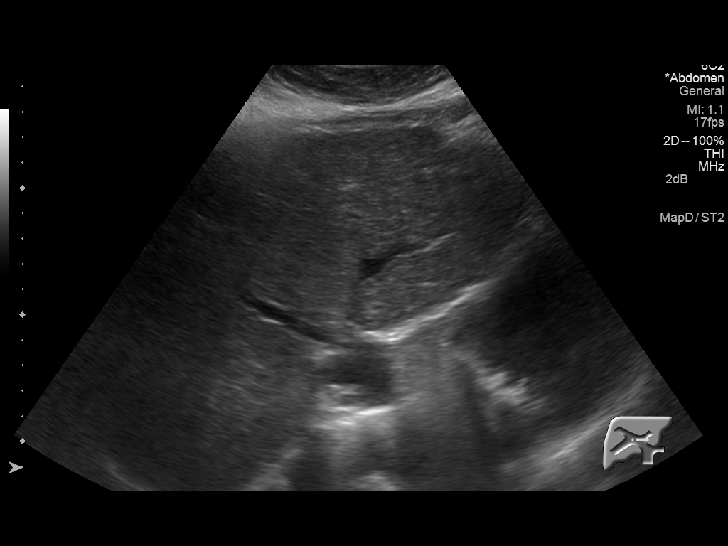
[im 41/123]
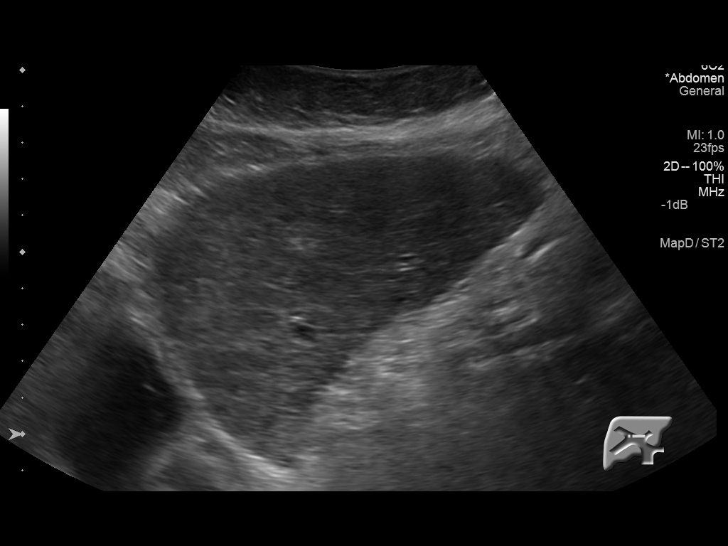
[im 46/123]
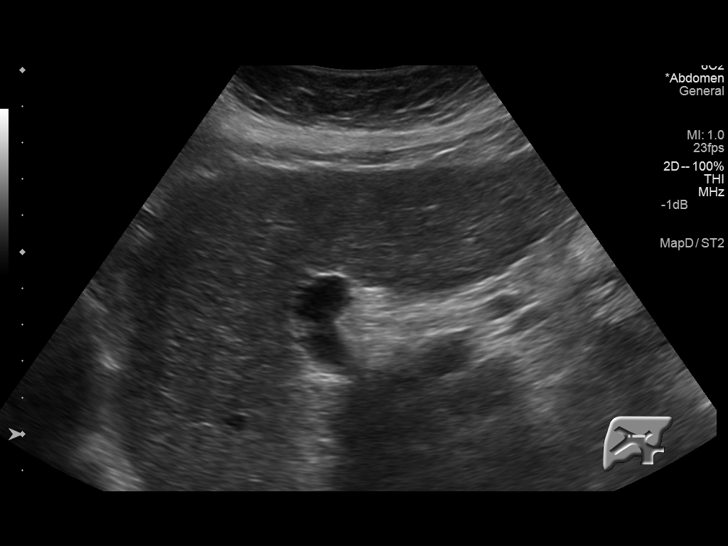
[im 56/123]
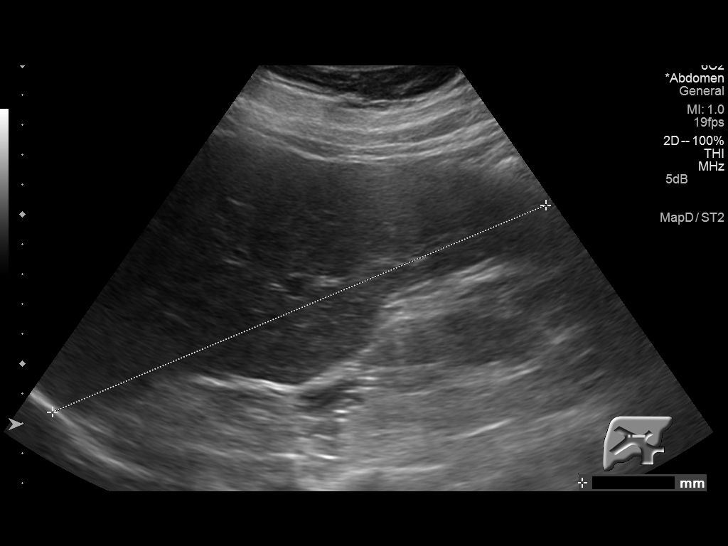
[im 67/123]
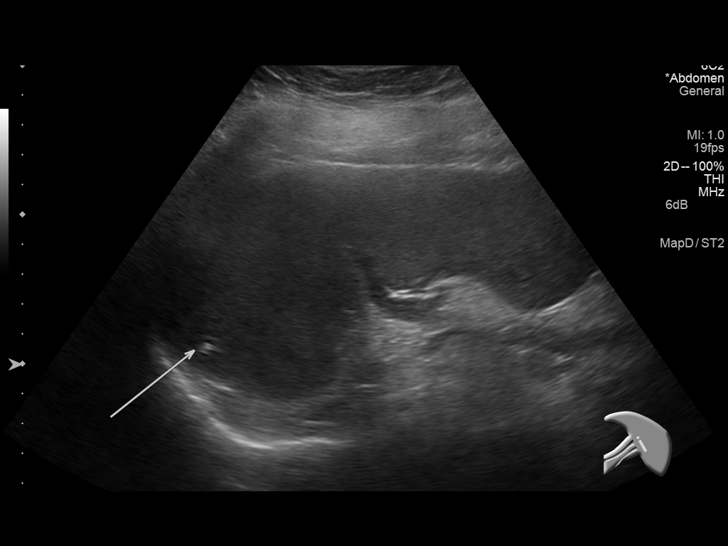
[im 77/123]
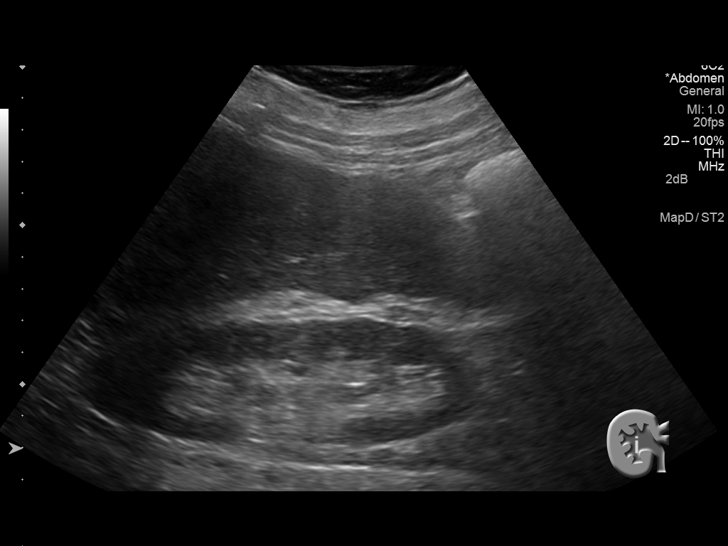
[im 82/123]
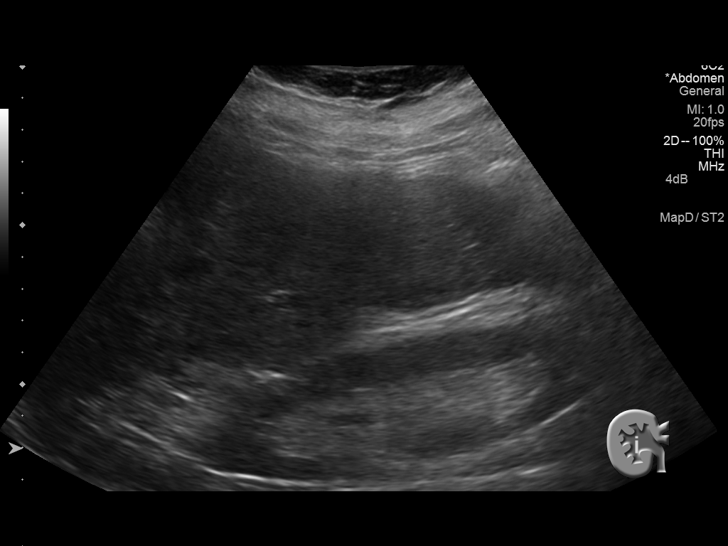
[im 92/123]
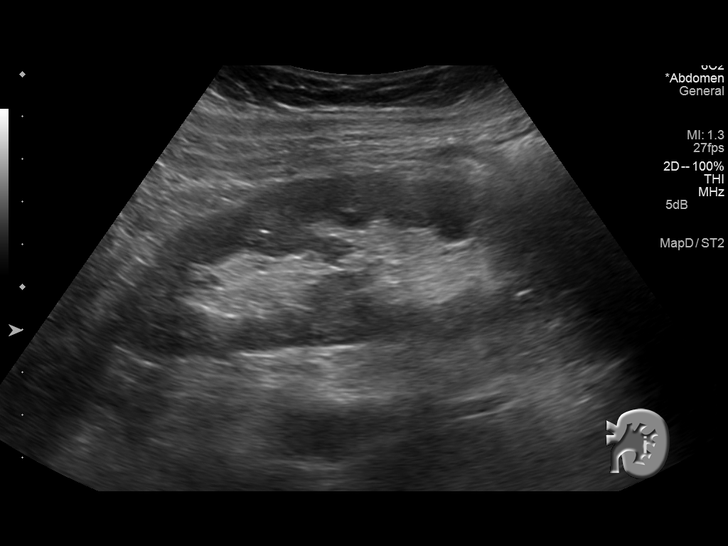
[im 102/123]
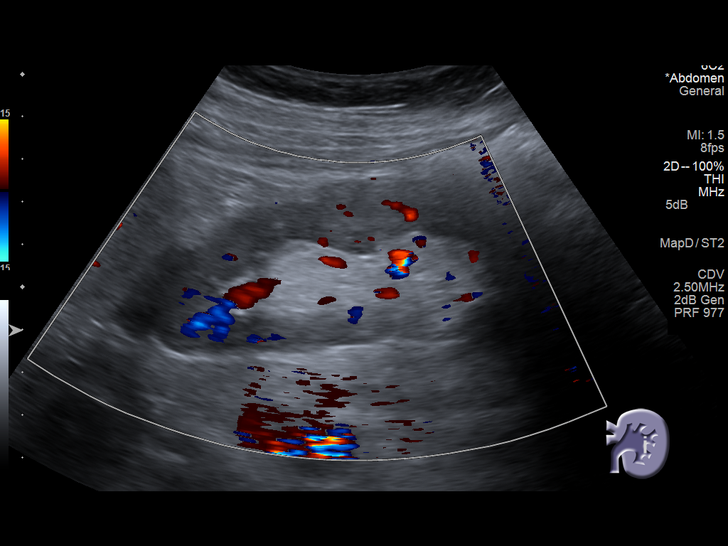
[im 112/123]
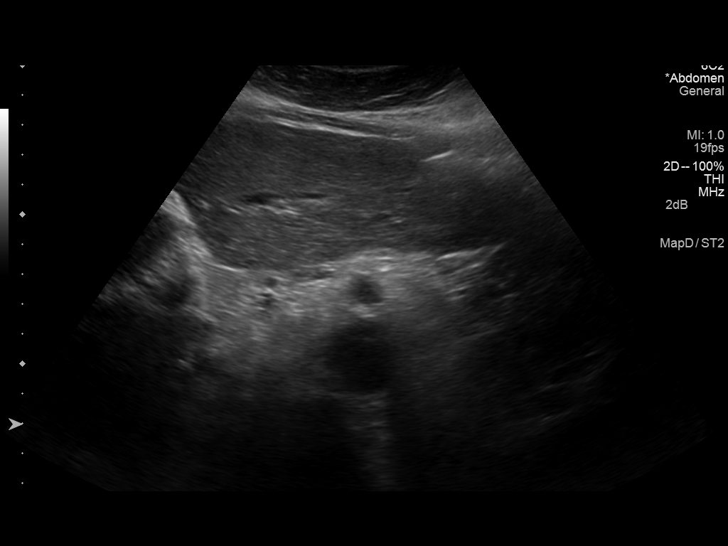
[im 123/123]
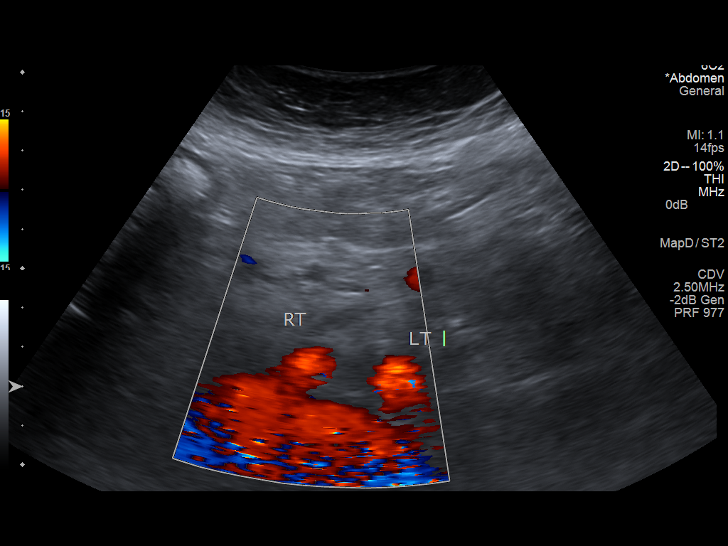

[14 of 25 positions shown; findings below may reference images not displayed]

FINDINGS: Gallbladder: No gallstones or wall thickening visualized. No
sonographic Murphy sign noted.

Common bile duct: Diameter: 5.8 mm

Liver: Liver has a coarsened echotexture with nodular contour
consistent with patient's known cirrhosis.

IVC: No abnormality visualized.

Pancreas: Visualized portion unremarkable.

Spleen: Spleen is enlarged at 13.3 cm with a splenic volume of 114
cubic cm.

Right Kidney: Length: 12.2 cm. Echogenicity within normal limits. No
mass or hydronephrosis visualized.

Left Kidney: Length: 13.1 cm. Echogenicity within normal limits. No
mass or hydronephrosis visualized.

Abdominal aorta: No aneurysm visualized.

Other findings: None.
IMPRESSION: 1. Splenic echotexture consistent with known cirrhosis. Similar
findings noted on prior exam.
2. Splenomegaly.  No acute abnormality identified to

## 2015-05-08 DIAGNOSIS — I10 Essential (primary) hypertension: Secondary | ICD-10-CM | POA: Diagnosis not present

## 2015-05-08 DIAGNOSIS — Z1389 Encounter for screening for other disorder: Secondary | ICD-10-CM | POA: Diagnosis not present

## 2015-05-08 DIAGNOSIS — Z23 Encounter for immunization: Secondary | ICD-10-CM | POA: Diagnosis not present

## 2015-05-08 DIAGNOSIS — E6609 Other obesity due to excess calories: Secondary | ICD-10-CM | POA: Diagnosis not present

## 2015-05-08 DIAGNOSIS — Z6830 Body mass index (BMI) 30.0-30.9, adult: Secondary | ICD-10-CM | POA: Diagnosis not present

## 2015-05-08 DIAGNOSIS — D649 Anemia, unspecified: Secondary | ICD-10-CM | POA: Diagnosis not present

## 2015-05-08 DIAGNOSIS — E782 Mixed hyperlipidemia: Secondary | ICD-10-CM | POA: Diagnosis not present

## 2015-05-08 DIAGNOSIS — E1140 Type 2 diabetes mellitus with diabetic neuropathy, unspecified: Secondary | ICD-10-CM | POA: Diagnosis not present

## 2015-05-08 DIAGNOSIS — R51 Headache: Secondary | ICD-10-CM | POA: Diagnosis not present

## 2015-05-15 ENCOUNTER — Other Ambulatory Visit (HOSPITAL_COMMUNITY): Payer: Medicare Other

## 2015-05-16 ENCOUNTER — Ambulatory Visit (HOSPITAL_COMMUNITY)
Admission: RE | Admit: 2015-05-16 | Discharge: 2015-05-16 | Disposition: A | Discharge status: Home / Self Care | Payer: Medicare Other | Source: Ambulatory Visit | Attending: Internal Medicine | Admitting: Internal Medicine

## 2015-05-16 DIAGNOSIS — K760 Fatty (change of) liver, not elsewhere classified: Secondary | ICD-10-CM | POA: Insufficient documentation

## 2015-05-16 DIAGNOSIS — E780 Pure hypercholesterolemia: Secondary | ICD-10-CM | POA: Diagnosis not present

## 2015-05-16 DIAGNOSIS — K7460 Unspecified cirrhosis of liver: Secondary | ICD-10-CM | POA: Insufficient documentation

## 2015-05-23 DIAGNOSIS — H9202 Otalgia, left ear: Secondary | ICD-10-CM | POA: Diagnosis not present

## 2015-05-23 DIAGNOSIS — Z6830 Body mass index (BMI) 30.0-30.9, adult: Secondary | ICD-10-CM | POA: Diagnosis not present

## 2015-05-23 DIAGNOSIS — E6609 Other obesity due to excess calories: Secondary | ICD-10-CM | POA: Diagnosis not present

## 2015-05-23 DIAGNOSIS — Z1389 Encounter for screening for other disorder: Secondary | ICD-10-CM | POA: Diagnosis not present

## 2015-05-23 DIAGNOSIS — J209 Acute bronchitis, unspecified: Secondary | ICD-10-CM | POA: Diagnosis not present

## 2015-06-01 ENCOUNTER — Observation Stay (HOSPITAL_COMMUNITY)
Admission: EM | Admit: 2015-06-01 | Discharge: 2015-06-02 | Disposition: A | Discharge status: Home / Self Care | Payer: Medicare Other | Attending: Internal Medicine | Admitting: Internal Medicine

## 2015-06-01 ENCOUNTER — Emergency Department (HOSPITAL_COMMUNITY): Payer: Medicare Other

## 2015-06-01 ENCOUNTER — Encounter (HOSPITAL_COMMUNITY): Payer: Self-pay | Admitting: *Deleted

## 2015-06-01 DIAGNOSIS — K219 Gastro-esophageal reflux disease without esophagitis: Secondary | ICD-10-CM | POA: Diagnosis not present

## 2015-06-01 DIAGNOSIS — Z792 Long term (current) use of antibiotics: Secondary | ICD-10-CM | POA: Diagnosis not present

## 2015-06-01 DIAGNOSIS — R079 Chest pain, unspecified: Secondary | ICD-10-CM | POA: Diagnosis not present

## 2015-06-01 DIAGNOSIS — I10 Essential (primary) hypertension: Secondary | ICD-10-CM | POA: Diagnosis present

## 2015-06-01 DIAGNOSIS — E119 Type 2 diabetes mellitus without complications: Secondary | ICD-10-CM | POA: Insufficient documentation

## 2015-06-01 DIAGNOSIS — G4733 Obstructive sleep apnea (adult) (pediatric): Secondary | ICD-10-CM | POA: Insufficient documentation

## 2015-06-01 DIAGNOSIS — D696 Thrombocytopenia, unspecified: Secondary | ICD-10-CM | POA: Diagnosis present

## 2015-06-01 DIAGNOSIS — R0602 Shortness of breath: Secondary | ICD-10-CM | POA: Diagnosis not present

## 2015-06-01 DIAGNOSIS — R072 Precordial pain: Secondary | ICD-10-CM | POA: Diagnosis not present

## 2015-06-01 DIAGNOSIS — I351 Nonrheumatic aortic (valve) insufficiency: Secondary | ICD-10-CM | POA: Insufficient documentation

## 2015-06-01 DIAGNOSIS — Z8673 Personal history of transient ischemic attack (TIA), and cerebral infarction without residual deficits: Secondary | ICD-10-CM | POA: Diagnosis not present

## 2015-06-01 DIAGNOSIS — I6523 Occlusion and stenosis of bilateral carotid arteries: Secondary | ICD-10-CM | POA: Insufficient documentation

## 2015-06-01 DIAGNOSIS — I252 Old myocardial infarction: Secondary | ICD-10-CM | POA: Diagnosis not present

## 2015-06-01 DIAGNOSIS — D649 Anemia, unspecified: Secondary | ICD-10-CM | POA: Insufficient documentation

## 2015-06-01 DIAGNOSIS — I25119 Atherosclerotic heart disease of native coronary artery with unspecified angina pectoris: Secondary | ICD-10-CM | POA: Diagnosis not present

## 2015-06-01 DIAGNOSIS — Z7982 Long term (current) use of aspirin: Secondary | ICD-10-CM | POA: Insufficient documentation

## 2015-06-01 DIAGNOSIS — N390 Urinary tract infection, site not specified: Secondary | ICD-10-CM | POA: Insufficient documentation

## 2015-06-01 DIAGNOSIS — R0789 Other chest pain: Secondary | ICD-10-CM | POA: Diagnosis not present

## 2015-06-01 DIAGNOSIS — K7460 Unspecified cirrhosis of liver: Secondary | ICD-10-CM | POA: Diagnosis present

## 2015-06-01 DIAGNOSIS — Z955 Presence of coronary angioplasty implant and graft: Secondary | ICD-10-CM | POA: Insufficient documentation

## 2015-06-01 DIAGNOSIS — I639 Cerebral infarction, unspecified: Secondary | ICD-10-CM | POA: Diagnosis present

## 2015-06-01 DIAGNOSIS — Z79899 Other long term (current) drug therapy: Secondary | ICD-10-CM | POA: Insufficient documentation

## 2015-06-01 DIAGNOSIS — Z794 Long term (current) use of insulin: Secondary | ICD-10-CM | POA: Diagnosis not present

## 2015-06-01 DIAGNOSIS — I2510 Atherosclerotic heart disease of native coronary artery without angina pectoris: Secondary | ICD-10-CM | POA: Diagnosis present

## 2015-06-01 DIAGNOSIS — I25118 Atherosclerotic heart disease of native coronary artery with other forms of angina pectoris: Secondary | ICD-10-CM | POA: Diagnosis not present

## 2015-06-01 DIAGNOSIS — E7800 Pure hypercholesterolemia, unspecified: Secondary | ICD-10-CM | POA: Diagnosis present

## 2015-06-01 DIAGNOSIS — Z87891 Personal history of nicotine dependence: Secondary | ICD-10-CM | POA: Diagnosis not present

## 2015-06-01 DIAGNOSIS — R197 Diarrhea, unspecified: Secondary | ICD-10-CM | POA: Diagnosis present

## 2015-06-01 DIAGNOSIS — R05 Cough: Secondary | ICD-10-CM | POA: Diagnosis not present

## 2015-06-01 DIAGNOSIS — I249 Acute ischemic heart disease, unspecified: Secondary | ICD-10-CM | POA: Diagnosis present

## 2015-06-01 DIAGNOSIS — J40 Bronchitis, not specified as acute or chronic: Secondary | ICD-10-CM | POA: Diagnosis present

## 2015-06-01 LAB — BASIC METABOLIC PANEL
Anion gap: 7 (ref 5–15)
BUN: 18 mg/dL (ref 6–20)
CO2: 25 mmol/L (ref 22–32)
CREATININE: 0.95 mg/dL (ref 0.44–1.00)
Calcium: 9.1 mg/dL (ref 8.9–10.3)
Chloride: 109 mmol/L (ref 101–111)
GFR calc Af Amer: >60 mL/min (ref >60)
GFR calc non Af Amer: 58 mL/min — ABNORMAL LOW (ref >60)
GLUCOSE: 93 mg/dL (ref 65–99)
Potassium: 4.4 mmol/L (ref 3.5–5.1)
Sodium: 141 mmol/L (ref 135–145)

## 2015-06-01 LAB — LIPID PANEL
CHOLESTEROL: 143 mg/dL (ref 0–200)
HDL: 49 mg/dL (ref >40)
LDL Cholesterol: 77 mg/dL (ref 0–99)
Total CHOL/HDL Ratio: 2.9 RATIO
Triglycerides: 86 mg/dL (ref <150)
VLDL: 17 mg/dL (ref 0–40)

## 2015-06-01 LAB — TROPONIN I

## 2015-06-01 LAB — GLUCOSE, CAPILLARY
GLUCOSE-CAPILLARY: 94 mg/dL (ref 65–99)
GLUCOSE-CAPILLARY: 136 mg/dL — AB (ref 65–99)

## 2015-06-01 LAB — CBC
HCT: 39.9 % (ref 36.0–46.0)
Hemoglobin: 13.6 g/dL (ref 12.0–15.0)
MCH: 31.3 pg (ref 26.0–34.0)
MCHC: 34.1 g/dL (ref 30.0–36.0)
MCV: 91.7 fL (ref 78.0–100.0)
PLATELETS: 142 10*3/uL — AB (ref 150–400)
RBC: 4.35 MIL/uL (ref 3.87–5.11)
RDW: 14.1 % (ref 11.5–15.5)
WBC: 9.4 10*3/uL (ref 4.0–10.5)

## 2015-06-01 MED ORDER — INSULIN GLARGINE 100 UNIT/ML ~~LOC~~ SOLN
40.0000 [IU] | Freq: Every day | SUBCUTANEOUS | Status: DC
  Administered 2015-06-01: 40 [IU] via SUBCUTANEOUS
  Filled 2015-06-01 (×3): qty 0.4

## 2015-06-01 MED ORDER — ENOXAPARIN SODIUM 40 MG/0.4ML ~~LOC~~ SOLN
40.0000 mg | SUBCUTANEOUS | Status: DC
  Administered 2015-06-01: 40 mg via SUBCUTANEOUS
  Filled 2015-06-01: qty 0.4

## 2015-06-01 MED ORDER — CARVEDILOL 3.125 MG PO TABS
6.2500 mg | ORAL_TABLET | Freq: Two times a day (BID) | ORAL | Status: DC
  Administered 2015-06-01 – 2015-06-02 (×2): 6.25 mg via ORAL
  Filled 2015-06-01 (×2): qty 2

## 2015-06-01 MED ORDER — INSULIN ASPART 100 UNIT/ML ~~LOC~~ SOLN: 0.0000 [IU] | Freq: Three times a day (TID) | SUBCUTANEOUS | Status: DC

## 2015-06-01 MED ORDER — ALUM & MAG HYDROXIDE-SIMETH 200-200-20 MG/5ML PO SUSP: 30.0000 mL | Freq: Four times a day (QID) | ORAL | Status: DC | PRN

## 2015-06-01 MED ORDER — SODIUM CHLORIDE 0.9 % IJ SOLN
3.0000 mL | Freq: Two times a day (BID) | INTRAMUSCULAR | Status: DC
Start: 2015-06-01 — End: 2015-06-02
  Administered 2015-06-02: 3 mL via INTRAVENOUS

## 2015-06-01 MED ORDER — ZOLPIDEM TARTRATE 5 MG PO TABS
5.0000 mg | ORAL_TABLET | Freq: Every day | ORAL | Status: DC
  Administered 2015-06-01 (×2): 5 mg via ORAL
  Filled 2015-06-01 (×2): qty 1

## 2015-06-01 MED ORDER — HYDROCODONE-ACETAMINOPHEN 5-325 MG PO TABS
1.0000 | ORAL_TABLET | ORAL | Status: DC | PRN
  Administered 2015-06-01: 1 via ORAL
  Filled 2015-06-01: qty 1

## 2015-06-01 MED ORDER — ALBUTEROL SULFATE (2.5 MG/3ML) 0.083% IN NEBU
2.5000 mg | INHALATION_SOLUTION | Freq: Four times a day (QID) | RESPIRATORY_TRACT | Status: DC
  Administered 2015-06-01 – 2015-06-02 (×3): 2.5 mg via RESPIRATORY_TRACT
  Filled 2015-06-01 (×4): qty 3

## 2015-06-01 MED ORDER — OMEPRAZOLE MAGNESIUM 20 MG PO TBEC: 20.0000 mg | DELAYED_RELEASE_TABLET | Freq: Every day | ORAL | Status: DC

## 2015-06-01 MED ORDER — ISOSORBIDE MONONITRATE ER 60 MG PO TB24
30.0000 mg | ORAL_TABLET | Freq: Every day | ORAL | Status: DC
  Administered 2015-06-02: 30 mg via ORAL
  Filled 2015-06-01: qty 1

## 2015-06-01 MED ORDER — ONDANSETRON HCL 4 MG PO TABS: 4.0000 mg | ORAL_TABLET | Freq: Four times a day (QID) | ORAL | Status: DC | PRN

## 2015-06-01 MED ORDER — ATORVASTATIN CALCIUM 40 MG PO TABS
40.0000 mg | ORAL_TABLET | Freq: Every day | ORAL | Status: DC
  Administered 2015-06-01: 40 mg via ORAL
  Filled 2015-06-01: qty 1

## 2015-06-01 MED ORDER — MORPHINE SULFATE (PF) 2 MG/ML IV SOLN: 1.0000 mg | INTRAVENOUS | Status: DC | PRN

## 2015-06-01 MED ORDER — ONDANSETRON HCL 4 MG/2ML IJ SOLN
4.0000 mg | Freq: Four times a day (QID) | INTRAMUSCULAR | Status: DC | PRN
  Administered 2015-06-02: 4 mg via INTRAVENOUS
  Filled 2015-06-01: qty 2

## 2015-06-01 MED ORDER — SODIUM CHLORIDE 0.9 % IV SOLN
INTRAVENOUS | Status: AC
  Administered 2015-06-01: 21:00:00 via INTRAVENOUS

## 2015-06-01 MED ORDER — ASPIRIN EC 325 MG PO TBEC
325.0000 mg | DELAYED_RELEASE_TABLET | Freq: Every day | ORAL | Status: DC
  Administered 2015-06-02: 325 mg via ORAL
  Filled 2015-06-01: qty 1

## 2015-06-01 MED ORDER — NITROGLYCERIN 0.4 MG SL SUBL: 0.4000 mg | SUBLINGUAL_TABLET | SUBLINGUAL | Status: DC | PRN

## 2015-06-01 MED ORDER — INSULIN ASPART 100 UNIT/ML ~~LOC~~ SOLN: 0.0000 [IU] | Freq: Every day | SUBCUTANEOUS | Status: DC

## 2015-06-01 MED ORDER — ACETAMINOPHEN 325 MG PO TABS
650.0000 mg | ORAL_TABLET | Freq: Four times a day (QID) | ORAL | Status: DC | PRN
  Administered 2015-06-01 – 2015-06-02 (×3): 650 mg via ORAL
  Filled 2015-06-01 (×4): qty 2

## 2015-06-01 MED ORDER — ALBUTEROL SULFATE (2.5 MG/3ML) 0.083% IN NEBU: 3.0000 mL | INHALATION_SOLUTION | RESPIRATORY_TRACT | Status: DC | PRN

## 2015-06-01 MED ORDER — PANTOPRAZOLE SODIUM 40 MG PO TBEC
40.0000 mg | DELAYED_RELEASE_TABLET | Freq: Every day | ORAL | Status: DC
  Administered 2015-06-02: 40 mg via ORAL
  Filled 2015-06-01: qty 1

## 2015-06-01 MED ORDER — GUAIFENESIN-DM 100-10 MG/5ML PO SYRP: 5.0000 mL | ORAL_SOLUTION | ORAL | Status: DC | PRN

## 2015-06-01 MED ORDER — SULFAMETHOXAZOLE-TRIMETHOPRIM 800-160 MG PO TABS
1.0000 | ORAL_TABLET | Freq: Two times a day (BID) | ORAL | Status: DC
  Administered 2015-06-01 – 2015-06-02 (×2): 1 via ORAL
  Filled 2015-06-01 (×2): qty 1

## 2015-06-01 MED ORDER — ACETAMINOPHEN 650 MG RE SUPP: 650.0000 mg | Freq: Four times a day (QID) | RECTAL | Status: DC | PRN

## 2015-06-01 NOTE — ED Notes (Signed)
Pt states cough x 1 week (has seen PCP). States she woke up this morning with pressure across her chest and pain remains.

## 2015-06-01 NOTE — Consult Note (Signed)
Primary cardiologist: Dr Carlyle Dolly MD Consulting cardiologist: Dr Carlyle Dolly MD   Clinical Summary [name] is a 74 y.o.female with history of CAD with prior MI in 1995 where she received 2 stents to the RCA, her last cath was 2007 and showed patent coronaries. She also has a history of CVA, HTN, NASH, and carotid stenosis admitted with chest pain. Episode started this when she woke up. 5/10 dull pain midchest and epigastric area. No other associated symptoms other than some SOB she has had the last several days due to bronchitis. Pain lasted approx 1 hour and went away on its own. Denies any other episodes. No SOB or DOE.   Recent bronchitis Trop neg x1, Hgb 13.6, Pt 142, Cr 0.95, K 4.4 CXR no acute process EKG SR, 1st degree AV block     Allergies  Allergen Reactions  . Tape Rash    ADHESIVE TAPE    Medications Scheduled Medications:    Infusions:    PRN Medications:     Past Medical History  Diagnosis Date  . Back pain   . GERD (gastroesophageal reflux disease)   . MI (myocardial infarction)   . Hypercholesteremia   . Anemia   . Cirrhosis   . Aortic sclerosis 07/04/2011  . Aortic insufficiency 07/04/2011    most recent 2D Echo 02/24/2012  EF greater than 55%  . Carotid stenosis, bilateral 07/04/2011    most recent 05/2010 carotid doppler  . Presence of stent in right coronary artery 07/04/2011  . HTN (hypertension) 07/04/2011  . Coronary artery disease   . Myocardial infarction 1995  . GERD (gastroesophageal reflux disease)   . Non-alcoholic fatty liver disease   . UTI (lower urinary tract infection)     chronic/recurring/on bactrim  . CVA (cerebral infarction)     03/07/2014  . Hiatal hernia   . IBS (irritable bowel syndrome)   . Diabetes mellitus 2005  . Varices, esophageal   . Stroke 03/07/2014    no deficits from stroke  . OSA (obstructive sleep apnea) 07/04/2011    sleep study 2009  AHI 9.91/hr and during REM sleep 35.58/hr     Past Surgical History  Procedure Laterality Date  . Back surgery  1985  . Tonsillectomy    . Appendectomy    . Colonoscopy    . Eye surgery  08    cataract surgery of the left eye  . Ercp    . Myringotomy  2012    both ears  . Hardware removal Right 01/17/2013    Procedure: REMOVAL OF HARDWARE AND EXCISION ULNAR STYLOID RIGHT WRIST;  Surgeon: Tennis Must, MD;  Location: [place];  Service: Orthopedics;  Laterality: Right;  . Wrist surgery      rt wrist hardwear removal  . Esophagogastroduodenoscopy N/A 04/01/2013    Procedure: ESOPHAGOGASTRODUODENOSCOPY (EGD);  Surgeon: Rogene Houston, MD;  [facility];  Service: Endoscopy;  Laterality: N/A;  230-rescheduled to 8:30am Ann notified pt  . Esophageal banding N/A 04/01/2013    Procedure: ESOPHAGEAL BANDING;  Surgeon: Rogene Houston, MD;  [facility];  Service: Endoscopy;  Laterality: N/A;  . Esophagogastroduodenoscopy N/A 05/24/2013    Procedure: ESOPHAGOGASTRODUODENOSCOPY (EGD);  Surgeon: Rogene Houston, MD;  [facility];  Service: Endoscopy;  Laterality: N/A;  730  . Esophageal banding N/A 05/24/2013    Procedure: ESOPHAGEAL BANDING;  Surgeon: Rogene Houston, MD;  [facility];  Service: Endoscopy;  Laterality: N/A;  . Dilation and curettage of uterus      x2  . Vaginal hysterectomy  1972  . Anal fissure repair  1972  . Epi retinal membrane peel Left   . Esophagogastroduodenoscopy N/A 06/21/2014    Procedure: ESOPHAGOGASTRODUODENOSCOPY (EGD);  Surgeon: Rogene Houston, MD;  [facility];  Service: Endoscopy;  Laterality: N/A;  930-rescheduled 10/14 @ 1200 Ann to notify pt  . Esophageal banding N/A 06/21/2014    Procedure: ESOPHAGEAL BANDING;  Surgeon: Rogene Houston, MD;  [facility];  Service: Endoscopy;  Laterality: N/A;  . Cardiac catheterization  1995.,2006,1997    stent to the proximal RCA after MI   . Cataract extraction w/phaco Right  07/04/2014    Procedure: CATARACT EXTRACTION PHACO AND INTRAOCULAR LENS PLACEMENT (IOC);  Surgeon: Elta Guadeloupe T. Gershon Crane, MD;  Location: [facility];  Service: Ophthalmology;  Laterality: Right;  CDE:13.13    Family History  Problem Relation Age of Onset  . Diabetes Mother   . Heart failure Father     Social History [name] reports that she quit smoking about 3 years ago. Her smoking use included Cigarettes. She has a 12.5 pack-year smoking history. She has never used smokeless tobacco. [name] reports that she does not drink alcohol.  Review of Systems CONSTITUTIONAL: No weight loss, fever, chills, weakness or fatigue.  HEENT: Eyes: No visual loss, blurred vision, double vision or yellow sclerae. No hearing loss, sneezing, congestion, runny nose or sore throat.  SKIN: No rash or itching.  CARDIOVASCULAR: per HPI RESPIRATORY: +cough GASTROINTESTINAL: No anorexia, nausea, vomiting or diarrhea. No abdominal pain or blood.  GENITOURINARY: no polyuria, no dysuria NEUROLOGICAL: No headache, dizziness, syncope, paralysis, ataxia, numbness or tingling in the extremities. No change in bowel or bladder control.  MUSCULOSKELETAL: No muscle, back pain, joint pain or stiffness.  HEMATOLOGIC: No anemia, bleeding or bruising.  LYMPHATICS: No enlarged nodes. No history of splenectomy.  PSYCHIATRIC: No history of depression or anxiety.      Physical Examination Blood pressure 144/57, pulse 84, temperature 97.7 F (36.5 C), temperature source Oral, resp. rate 19, height 5' 3"  (1.6 m), weight 160 lb (72.576 kg), SpO2 96 %. No intake or output data in the 24 hours ending 06/01/15 1534  HEENT: sclera clear  Cardiovascular: RRR, no m/r/g, no no jvd  Respiratory: CTAB  GI: abdomen soft, NT,ND  MSK: no LE edema. Chest wall mildly tender to palpation  Neuro: no focal deficits  Psych: appropriate affect   Lab Results  Basic Metabolic Panel:  Recent Labs Lab 06/01/15 1020  NA 141  K 4.4  CL  109  CO2 25  GLUCOSE 93  BUN 18  CREATININE 0.95  CALCIUM 9.1    Liver Function Tests: No results for input(s): AST, ALT, ALKPHOS, BILITOT, PROT, ALBUMIN in the last 168 hours.  CBC:  Recent Labs Lab 06/01/15 1020  WBC 9.4  HGB 13.6  HCT 39.9  MCV 91.7  PLT 142*    Cardiac Enzymes:  Recent Labs Lab 06/01/15 1020  TROPONINI <0.03    BNP: Invalid input(s): POCBNP     Impression/Recommendations 1. Chest pain - unclear etiology, she does have some mild tenderness to palpation - EKG and enzymes x 1 are normal. Given her known histoyr of CAD would montior overnight, if rules out would discharge in AM with f/u with NP Lawrence in 2 weeks. Continue her home cardiac meds        Carlyle Dolly, M.D.

## 2015-06-01 NOTE — ED Provider Notes (Signed)
CSN: [id number]     Arrival date & time 06/01/15  3825 History   First MD Initiated Contact with Patient 06/01/15 1001     Chief Complaint  Patient presents with  . Chest Pain     (Consider location/radiation/quality/duration/timing/severity/associated sxs/prior Treatment) Patient is a 74 y.o. female presenting with chest pain. The history is provided by the patient (Patient complains of chest pressure today lasting for about 30 minutes she states that the symptoms were similar to when she had heart problems. The patient states that it is been years since she's had this discomfort).  Chest Pain Pain location:  Substernal area Pain radiates to:  Does not radiate Pain severity:  Moderate Onset quality:  Gradual Timing:  Constant Progression:  Waxing and waning Chronicity:  New Context: not breathing   Associated symptoms: no abdominal pain, no back pain, no cough, no fatigue and no headache     Past Medical History  Diagnosis Date  . Back pain   . GERD (gastroesophageal reflux disease)   . MI (myocardial infarction)   . Hypercholesteremia   . Anemia   . Cirrhosis   . Aortic sclerosis 07/04/2011  . Aortic insufficiency 07/04/2011    most recent 2D Echo 02/24/2012  EF greater than 55%  . Carotid stenosis, bilateral 07/04/2011    most recent 05/2010 carotid doppler  . Presence of stent in right coronary artery 07/04/2011  . HTN (hypertension) 07/04/2011  . Coronary artery disease   . Myocardial infarction 1995  . GERD (gastroesophageal reflux disease)   . Non-alcoholic fatty liver disease   . UTI (lower urinary tract infection)     chronic/recurring/on bactrim  . CVA (cerebral infarction)     03/07/2014  . Hiatal hernia   . IBS (irritable bowel syndrome)   . Diabetes mellitus 2005  . Varices, esophageal   . Stroke 03/07/2014    no deficits from stroke  . OSA (obstructive sleep apnea) 07/04/2011    sleep study 2009  AHI 9.91/hr and during REM sleep 35.58/hr   Past  Surgical History  Procedure Laterality Date  . Back surgery  1985  . Tonsillectomy    . Appendectomy    . Colonoscopy    . Eye surgery  08    cataract surgery of the left eye  . Ercp    . Myringotomy  2012    both ears  . Hardware removal Right 01/17/2013    Procedure: REMOVAL OF HARDWARE AND EXCISION ULNAR STYLOID RIGHT WRIST;  Surgeon: Tennis Must, MD;  Location: [place];  Service: Orthopedics;  Laterality: Right;  . Wrist surgery      rt wrist hardwear removal  . Esophagogastroduodenoscopy N/A 04/01/2013    Procedure: ESOPHAGOGASTRODUODENOSCOPY (EGD);  Surgeon: Rogene Houston, MD;  [facility];  Service: Endoscopy;  Laterality: N/A;  230-rescheduled to 8:30am Ann notified pt  . Esophageal banding N/A 04/01/2013    Procedure: ESOPHAGEAL BANDING;  Surgeon: Rogene Houston, MD;  [facility];  Service: Endoscopy;  Laterality: N/A;  . Esophagogastroduodenoscopy N/A 05/24/2013    Procedure: ESOPHAGOGASTRODUODENOSCOPY (EGD);  Surgeon: Rogene Houston, MD;  [facility];  Service: Endoscopy;  Laterality: N/A;  730  . Esophageal banding N/A 05/24/2013    Procedure: ESOPHAGEAL BANDING;  Surgeon: Rogene Houston, MD;  [facility];  Service: Endoscopy;  Laterality: N/A;  . Dilation and curettage of uterus      x2  . Vaginal hysterectomy  1972  . Anal fissure repair  1972  . Epi retinal membrane peel Left   . Esophagogastroduodenoscopy N/A 06/21/2014    Procedure: ESOPHAGOGASTRODUODENOSCOPY (EGD);  Surgeon: Rogene Houston, MD;  [facility];  Service: Endoscopy;  Laterality: N/A;  930-rescheduled 10/14 @ 1200 Ann to notify pt  . Esophageal banding N/A 06/21/2014    Procedure: ESOPHAGEAL BANDING;  Surgeon: Rogene Houston, MD;  [facility];  Service: Endoscopy;  Laterality: N/A;  . Cardiac catheterization  1995.,2006,1997    stent to the proximal RCA after MI   . Cataract extraction w/phaco Right 07/04/2014     Procedure: CATARACT EXTRACTION PHACO AND INTRAOCULAR LENS PLACEMENT (IOC);  Surgeon: Elta Guadeloupe T. Gershon Crane, MD;  Location: [facility];  Service: Ophthalmology;  Laterality: Right;  CDE:13.13   Family History  Problem Relation Age of Onset  . Diabetes Mother   . Heart failure Father    Social History  Substance Use Topics  . Smoking status: Former Smoker -- 0.25 packs/day for 50 years    Types: Cigarettes    Quit date: 01/13/2012  . Smokeless tobacco: Never Used  . Alcohol Use: No   OB History    No data available     Review of Systems  Constitutional: Negative for appetite change and fatigue.  HENT: Negative for congestion, ear discharge and sinus pressure.   Eyes: Negative for discharge.  Respiratory: Negative for cough.   Cardiovascular: Positive for chest pain.  Gastrointestinal: Negative for abdominal pain and diarrhea.  Genitourinary: Negative for frequency and hematuria.  Musculoskeletal: Negative for back pain.  Skin: Negative for rash.  Neurological: Negative for seizures and headaches.  Psychiatric/Behavioral: Negative for hallucinations.      Allergies  Tape  Home Medications   Prior to Admission medications   Medication Sig Start Date End Date Taking? Authorizing Shanitra Phillippi  aspirin EC 81 MG tablet Take 81 mg by mouth daily.   Yes Historical Seniya Stoffers, MD  atorvastatin (LIPITOR) 40 MG tablet Take 40 mg by mouth daily.   Yes Historical Gertie Broerman, MD  carvedilol (COREG) 6.25 MG tablet Take 6.25 mg by mouth 2 (two) times daily with a meal.   Yes Historical Jr Milliron, MD  HUMULIN N KWIKPEN 100 UNIT/ML Kiwkpen Inject 50 Units into the skin 2 (two) times daily.  07/11/14  Yes Historical Omah Dewalt, MD  insulin glargine (LANTUS SOLOSTAR) 100 UNIT/ML injection Inject 50 Units into the skin at bedtime.    Yes Historical Kennet Mccort, MD  isosorbide mononitrate (IMDUR) 30 MG 24 hr tablet Take 30 mg by mouth daily.   Yes Historical Natalio Salois, MD  omeprazole (PRILOSEC OTC) 20 MG tablet  Take 20 mg by mouth daily.   Yes Historical Sumaiyah Markert, MD  PROAIR RESPICLICK 295 (90 BASE) MCG/ACT AEPB Inhale 2 puffs into the lungs every 4 (four) hours as needed. 05/24/15  Yes Historical Raymond Azure, MD  sulfamethoxazole-trimethoprim (BACTRIM DS,SEPTRA DS) 800-160 MG per tablet Take 1 tablet by mouth 2 (two) times daily. For 10 days   Yes Historical Caryl Fate, MD  zolpidem (AMBIEN) 10 MG tablet Take 1 tablet (10 mg total) by mouth at bedtime. 04/19/15  Yes Butch Penny, NP  rosuvastatin (CRESTOR) 40 MG tablet Take 1 tablet (40 mg total) by mouth at bedtime. NEEDS APPOINTMENT FOR FUTURE REFILLS Patient not taking: Reported on 06/01/2015 12/06/14   Pixie Casino, MD   BP 156/48 mmHg  Pulse 84  Temp(Src) 97.7 F (36.5 C) (Oral)  Resp 10  Ht 5' 3"  (1.6  m)  Wt 160 lb (72.576 kg)  BMI 28.35 kg/m2  SpO2 98% Physical Exam  Constitutional: She is oriented to person, place, and time. She appears well-developed.  HENT:  Head: Normocephalic.  Eyes: Conjunctivae and EOM are normal. No scleral icterus.  Neck: Neck supple. No thyromegaly present.  Cardiovascular: Normal rate and regular rhythm.  Exam reveals no gallop and no friction rub.   No murmur heard. Pulmonary/Chest: No stridor. She has no wheezes. She has no rales. She exhibits no tenderness.  Abdominal: She exhibits no distension. There is no tenderness. There is no rebound.  Musculoskeletal: Normal range of motion. She exhibits no edema.  Lymphadenopathy:    She has no cervical adenopathy.  Neurological: She is oriented to person, place, and time. She exhibits normal muscle tone. Coordination normal.  Skin: No rash noted. No erythema.  Psychiatric: She has a normal mood and affect. Her behavior is normal.    ED Course  Procedures (including critical care time) Labs Review Labs Reviewed  BASIC METABOLIC PANEL - Abnormal; Notable for the following:    GFR calc non Af Amer 58 (*)    All other components within normal limits  CBC -  Abnormal; Notable for the following:    Platelets 142 (*)    All other components within normal limits  TROPONIN I    Imaging Review Dg Chest 2 View  06/01/2015   CLINICAL DATA:  Initial encounter for nonproductive cough and chest congestion for 2 weeks. Mid chest pain and shortness of Breath starting this morning.  EXAM: CHEST  2 VIEW  COMPARISON:  09/03/2014.  FINDINGS: The lungs are clear without focal pneumonia, edema, pneumothorax or pleural effusion. Hyperexpansion is consistent with emphysema. The cardiopericardial silhouette is within normal limits for size. Imaged bony structures of the thorax are intact. Telemetry leads overlie the chest.  IMPRESSION: Hyperexpansion without acute cardiopulmonary findings.   Electronically Signed   By: Misty Stanley M.D.   On: 06/01/2015 11:06   I have personally reviewed and evaluated these images and lab results as part of my medical decision-making.   EKG Interpretation   Date/Time:  Friday June 01 2015 09:46:43 EDT Ventricular Rate:  79 PR Interval:  224 QRS Duration: 95 QT Interval:  410 QTC Calculation: 470 R Axis:   56 Text Interpretation:  Sinus rhythm Prolonged PR interval Probable  anteroseptal infarct, old Confirmed by ZAMMIT  MD, JOSEPH [phone]) on  06/01/2015 1:04:26 PM      MDM   Final diagnoses:  Chest pain at rest    I spoke with Dr. branch her cardiologist and he recommends admitting her for rule out. Cardiology will come see the patient.    Milton Ferguson, MD 06/01/15 1355

## 2015-06-01 NOTE — H&P (Signed)
[facility] History and Physical  [name] IOE:[id number] DOB: 05-19-1941 DOA: 06/01/2015  Referring physician: zammit PCP: Rocky Morel, MD   Chief Complaint: chest pain  HPI: [name] is a very pleasant 74 y.o. female with a past medical history that includes non-EtOH cirrhosis, CVA last year no deficits, CAD history of MI 1995 status post 2 stents to RCA, carotid stenosis presents to the emergency department with the chief complaint of chest pain at rest. Patient's cardiologist consulted and recommended admission to rule out. Patient reports she was in her usual state of health until this morning when she awakened and noticed a "pressure" in her chest. She states the pain was located substernal area nonradiating. Nothing made it better or worse. She did not take any thing for it. It was constant lasted about 30 minutes and rated it a 6 out of 10. She states that this pain was nothing like the pain she had when she had her heart attack. She denies any headache visual disturbances shortness of breath palpitations diaphoresis nausea or vomiting. She denies any lightheadedness dizziness syncope or near-syncope. She denies any fever chills dysuria hematuria frequency or urgency. She does report that a week ago she was diagnosed with acute bronchitis placed on inhalers and antibiotic and developed some loose stool but denies abdominal cramping or watery stool. She also reports unintentional weight loss of 40 pounds over the last year since her stroke. She denies intentional dieting but does report decreased appetite. She denies any difficulty chewing or swallowing  Workup in the emergency department includes a CBC significant for platelet count of 142 otherwise unremarkable basic metabolic panel this unremarkable and initial troponin is negative EKG without acute changes. Chest x-ray reveals hyperexpansion consistent with emphysema without acute cardiopulmonary findings. She  is afebrile hemodynamically stable and not hypoxic.  Review of Systems:  10 point review of systems complete and all systems are negative except as indicated in the history of present illness Past Medical History  Diagnosis Date  . Back pain   . GERD (gastroesophageal reflux disease)   . MI (myocardial infarction)   . Hypercholesteremia   . Anemia   . Cirrhosis   . Aortic sclerosis 07/04/2011  . Aortic insufficiency 07/04/2011    most recent 2D Echo 02/24/2012  EF greater than 55%  . Carotid stenosis, bilateral 07/04/2011    most recent 05/2010 carotid doppler  . Presence of stent in right coronary artery 07/04/2011  . HTN (hypertension) 07/04/2011  . Coronary artery disease   . Myocardial infarction 1995  . GERD (gastroesophageal reflux disease)   . Non-alcoholic fatty liver disease   . UTI (lower urinary tract infection)     chronic/recurring/on bactrim  . CVA (cerebral infarction)     03/07/2014  . Hiatal hernia   . IBS (irritable bowel syndrome)   . Diabetes mellitus 2005  . Varices, esophageal   . Stroke 03/07/2014    no deficits from stroke  . OSA (obstructive sleep apnea) 07/04/2011    sleep study 2009  AHI 9.91/hr and during REM sleep 35.58/hr   Past Surgical History  Procedure Laterality Date  . Back surgery  1985  . Tonsillectomy    . Appendectomy    . Colonoscopy    . Eye surgery  08    cataract surgery of the left eye  . Ercp    . Myringotomy  2012    both ears  . Hardware removal Right 01/17/2013    Procedure:  REMOVAL OF HARDWARE AND EXCISION ULNAR STYLOID RIGHT WRIST;  Surgeon: Tennis Must, MD;  Location: [place];  Service: Orthopedics;  Laterality: Right;  . Wrist surgery      rt wrist hardwear removal  . Esophagogastroduodenoscopy N/A 04/01/2013    Procedure: ESOPHAGOGASTRODUODENOSCOPY (EGD);  Surgeon: Rogene Houston, MD;  [facility];  Service: Endoscopy;  Laterality: N/A;  230-rescheduled to 8:30am Ann notified pt  .  Esophageal banding N/A 04/01/2013    Procedure: ESOPHAGEAL BANDING;  Surgeon: Rogene Houston, MD;  [facility];  Service: Endoscopy;  Laterality: N/A;  . Esophagogastroduodenoscopy N/A 05/24/2013    Procedure: ESOPHAGOGASTRODUODENOSCOPY (EGD);  Surgeon: Rogene Houston, MD;  [facility];  Service: Endoscopy;  Laterality: N/A;  730  . Esophageal banding N/A 05/24/2013    Procedure: ESOPHAGEAL BANDING;  Surgeon: Rogene Houston, MD;  [facility];  Service: Endoscopy;  Laterality: N/A;  . Dilation and curettage of uterus      x2  . Vaginal hysterectomy  1972  . Anal fissure repair  1972  . Epi retinal membrane peel Left   . Esophagogastroduodenoscopy N/A 06/21/2014    Procedure: ESOPHAGOGASTRODUODENOSCOPY (EGD);  Surgeon: Rogene Houston, MD;  [facility];  Service: Endoscopy;  Laterality: N/A;  930-rescheduled 10/14 @ 1200 Ann to notify pt  . Esophageal banding N/A 06/21/2014    Procedure: ESOPHAGEAL BANDING;  Surgeon: Rogene Houston, MD;  [facility];  Service: Endoscopy;  Laterality: N/A;  . Cardiac catheterization  1995.,2006,1997    stent to the proximal RCA after MI   . Cataract extraction w/phaco Right 07/04/2014    Procedure: CATARACT EXTRACTION PHACO AND INTRAOCULAR LENS PLACEMENT (IOC);  Surgeon: Elta Guadeloupe T. Gershon Crane, MD;  Location: [facility];  Service: Ophthalmology;  Laterality: Right;  CDE:13.13   Social History:  reports that she quit smoking about 3 years ago. Her smoking use included Cigarettes. She has a 12.5 pack-year smoking history. She has never used smokeless tobacco. She reports that she does not drink alcohol or use illicit drugs. She smokes occasionally she is retired she lives alone she is independent with ADLs Allergies  Allergen Reactions  . Tape Rash    ADHESIVE TAPE    Family History  Problem Relation Age of Onset  . Diabetes Mother   . Heart failure Father    her mother had diabetes but died as a result of  MV C her father died complications from CAD. She has one sibling who has CAD  Prior to Admission medications   Medication Sig Start Date End Date Taking? Authorizing Provider  aspirin EC 81 MG tablet Take 81 mg by mouth daily.   Yes Historical Provider, MD  atorvastatin (LIPITOR) 40 MG tablet Take 40 mg by mouth daily.   Yes Historical Provider, MD  carvedilol (COREG) 6.25 MG tablet Take 6.25 mg by mouth 2 (two) times daily with a meal.   Yes Historical Provider, MD  HUMULIN N KWIKPEN 100 UNIT/ML Kiwkpen Inject 50 Units into the skin 2 (two) times daily.  07/11/14  Yes Historical Provider, MD  insulin glargine (LANTUS SOLOSTAR) 100 UNIT/ML injection Inject 50 Units into the skin at bedtime.    Yes Historical Provider, MD  isosorbide mononitrate (IMDUR) 30 MG 24 hr tablet Take 30 mg by mouth daily.   Yes Historical Provider, MD  omeprazole (PRILOSEC OTC) 20 MG tablet Take 20 mg by mouth daily.   Yes Historical  Provider, MD  PROAIR RESPICLICK 453 (90 BASE) MCG/ACT AEPB Inhale 2 puffs into the lungs every 4 (four) hours as needed. 05/24/15  Yes Historical Provider, MD  sulfamethoxazole-trimethoprim (BACTRIM DS,SEPTRA DS) 800-160 MG per tablet Take 1 tablet by mouth 2 (two) times daily. For 10 days   Yes Historical Provider, MD  zolpidem (AMBIEN) 10 MG tablet Take 1 tablet (10 mg total) by mouth at bedtime. 04/19/15  Yes Butch Penny, NP   Physical Exam: Filed Vitals:   06/01/15 1215 06/01/15 1230 06/01/15 1300 06/01/15 1330  BP:  149/48 156/48 139/52  Pulse: 81 79 84 81  Temp:      TempSrc:      Resp: 19 12 10 19   Height:      Weight:      SpO2: 97% 97% 98% 97%    Wt Readings from Last 3 Encounters:  06/01/15 72.576 kg (160 lb)  04/19/15 79.425 kg (175 lb 1.6 oz)  04/09/15 79.289 kg (174 lb 12.8 oz)    General:  Appears calm and comfortable well-nourished Eyes: PERRL, normal lids, irises & conjunctiva ENT: grossly normal hearing, mucous membranes of her mouth are pink slightly  dry Neck: no LAD, masses or thyromegaly Cardiovascular: RRR, no m/r/g. No LE edema. Pedal pulses present and palpable Telemetry: SR, no arrhythmias  Respiratory:  Normal respiratory effort. Breath sounds with good airflow mildly coarse faint expiratory wheeze Abdomen: soft, ntnd positive bowel sounds no guarding or rebounding Skin: no rash or induration seen on limited exam Musculoskeletal: grossly normal tone BUE/BLE Psychiatric: grossly normal mood and affect, speech fluent and appropriate Neurologic: grossly non-focal. Speech clear facial symmetry moves all extremities           Labs on Admission:  Basic Metabolic Panel:  Recent Labs Lab 06/01/15 1020  NA 141  K 4.4  CL 109  CO2 25  GLUCOSE 93  BUN 18  CREATININE 0.95  CALCIUM 9.1   Liver Function Tests: No results for input(s): AST, ALT, ALKPHOS, BILITOT, PROT, ALBUMIN in the last 168 hours. No results for input(s): LIPASE, AMYLASE in the last 168 hours. No results for input(s): AMMONIA in the last 168 hours. CBC:  Recent Labs Lab 06/01/15 1020  WBC 9.4  HGB 13.6  HCT 39.9  MCV 91.7  PLT 142*   Cardiac Enzymes:  Recent Labs Lab 06/01/15 1020  TROPONINI <0.03    BNP (last 3 results) No results for input(s): BNP in the last 8760 hours.  ProBNP (last 3 results) No results for input(s): PROBNP in the last 8760 hours.  CBG: No results for input(s): GLUCAP in the last 168 hours.  Radiological Exams on Admission: Dg Chest 2 View  06/01/2015   CLINICAL DATA:  Initial encounter for nonproductive cough and chest congestion for 2 weeks. Mid chest pain and shortness of Breath starting this morning.  EXAM: CHEST  2 VIEW  COMPARISON:  09/03/2014.  FINDINGS: The lungs are clear without focal pneumonia, edema, pneumothorax or pleural effusion. Hyperexpansion is consistent with emphysema. The cardiopericardial silhouette is within normal limits for size. Imaged bony structures of the thorax are intact. Telemetry leads  overlie the chest.  IMPRESSION: Hyperexpansion without acute cardiopulmonary findings.   Electronically Signed   By: Misty Stanley M.D.   On: 06/01/2015 11:06    EKG: Independently reviewed sinus rhythm  Assessment/Plan Principal Problem:   Chest pain: At rest atypical. Heart score 4-5. Suspect more musculoskeletal as patient diagnosed with acute bronchitis more than a week  ago and has frequent coughing. Mild tenderness on exam. Will admit to telemetry to rule out. She's had no further chest pressure since her presentation. Will cycle troponins and get serial EKG. Obtain a lipid panel. Provide oxygen supplementation as needed. We will provide aspirin nitroglycerin sublingual as needed. Will continue her statin. Her cardiologist was called and he will evaluate as well  Active Problems: Bronchitis: Acute. Diagnosis last week by PCP and placed on Septra. She does have some mild wheezing and coarse breath sounds. Will provide oxygen as needed nebulizers. I think there is need for steroid-induced this point. Monitor  Diarrhea: Patient reports for loose stool since her presentation in the emergency department. She's been on Septra for a week. She denies any abdominal cramping. She reports stool was formed but loose. Will obtain C. difficile PCR.   HTN (hypertension): Controlled. Continue home medications which include Coreg M Doerr    CAD (coronary artery disease): Chart review indicates she saw her cardiologist last month. She had an MI in 1995 status post stents to RCA. Documentation indicates last cath 2007 with patent coronaries.    DM (diabetes mellitus): Reports recent hemoglobin A1c 5. Will continue her Lantus at a slightly lower dose. She states she does not take them twice a day Humulin. Will use sliding scale for optimal control provide a heart healthy car modified diet    Hypercholesteremia: Lipid panel    GERD (gastroesophageal reflux disease): Stable continue PPI     Hepatic cirrhosis:  Stable   CVA (cerebral vascular accident): No deficits. She takes 81 mg of aspirin daily    Dr Harl Bowie     Code Status: full DVT Prophylaxis: Family Communication: none present Disposition Plan: home hopefully tomorrow  Time spent: 34 minutes  [facility] Pager [phone]

## 2015-06-02 DIAGNOSIS — I25118 Atherosclerotic heart disease of native coronary artery with other forms of angina pectoris: Secondary | ICD-10-CM | POA: Diagnosis not present

## 2015-06-02 DIAGNOSIS — R079 Chest pain, unspecified: Secondary | ICD-10-CM | POA: Diagnosis not present

## 2015-06-02 DIAGNOSIS — K219 Gastro-esophageal reflux disease without esophagitis: Secondary | ICD-10-CM | POA: Diagnosis not present

## 2015-06-02 DIAGNOSIS — I252 Old myocardial infarction: Secondary | ICD-10-CM | POA: Diagnosis not present

## 2015-06-02 DIAGNOSIS — E780 Pure hypercholesterolemia: Secondary | ICD-10-CM | POA: Diagnosis not present

## 2015-06-02 LAB — GLUCOSE, CAPILLARY
Glucose-Capillary: 73 mg/dL (ref 65–99)
Glucose-Capillary: 108 mg/dL — ABNORMAL HIGH (ref 65–99)

## 2015-06-02 LAB — TROPONIN I: Troponin I: <0.03 ng/mL (ref <0.031)

## 2015-06-02 LAB — HEMOGLOBIN A1C
HEMOGLOBIN A1C: 5.5 % (ref 4.8–5.6)
Mean Plasma Glucose: 111 mg/dL

## 2015-06-02 MED ORDER — INFLUENZA VAC SPLIT QUAD 0.5 ML IM SUSY: 0.5000 mL | PREFILLED_SYRINGE | INTRAMUSCULAR | Status: DC

## 2015-06-02 NOTE — Discharge Summary (Signed)
Physician Discharge Summary  [name]:[id number] DOB: July 15, 1941 DOA: 06/01/2015  PCP: Rocky Morel, MD  Admit date: 06/01/2015 Discharge date: 06/02/2015  Time spent: 45 minutes  Recommendations for Outpatient Follow-up:  -Will be discharged home today. -Advised to follow up with cardiology in 2 weeks.   Discharge Diagnoses:  Principal Problem:   Chest pain Active Problems:   Chest pain at rest   CAD (coronary artery disease)   DM (diabetes mellitus)   Hypercholesteremia   GERD (gastroesophageal reflux disease)   HTN (hypertension)   Hepatic cirrhosis   CVA (cerebral vascular accident)   Diarrhea   Bronchitis   Thrombocytopenia   Discharge Condition: Stable and improved  Filed Weights   06/01/15 0951 06/01/15 1607 06/02/15 0552  Weight: 72.576 kg (160 lb) 73.483 kg (162 lb) 74.072 kg (163 lb 4.8 oz)    History of present illness:  [name] is a very pleasant 74 y.o. female with a past medical history that includes non-EtOH cirrhosis, CVA last year no deficits, CAD history of MI 1995 status post 2 stents to RCA, carotid stenosis presents to the emergency department with the chief complaint of chest pain at rest. Patient's cardiologist consulted and recommended admission to rule out. Patient reports she was in her usual state of health until this morning when she awakened and noticed a "pressure" in her chest. She states the pain was located substernal area nonradiating. Nothing made it better or worse. She did not take any thing for it. It was constant lasted about 30 minutes and rated it a 6 out of 10. She states that this pain was nothing like the pain she had when she had her heart attack. She denies any headache visual disturbances shortness of breath palpitations diaphoresis nausea or vomiting. She denies any lightheadedness dizziness syncope or near-syncope. She denies any fever chills dysuria hematuria frequency or urgency. She does report  that a week ago she was diagnosed with acute bronchitis placed on inhalers and antibiotic and developed some loose stool but denies abdominal cramping or watery stool. She also reports unintentional weight loss of 40 pounds over the last year since her stroke. She denies intentional dieting but does report decreased appetite. She denies any difficulty chewing or swallowing  Workup in the emergency department includes a CBC significant for platelet count of 142 otherwise unremarkable basic metabolic panel this unremarkable and initial troponin is negative EKG without acute changes. Chest x-ray reveals hyperexpansion consistent with emphysema without acute cardiopulmonary findings. She is afebrile hemodynamically stable and not hypoxic.   Hospital Course:   Chest Pain with H/o CAD -Rules out for ACS by negative enzymes and EKG without acute ischemic abnormalities. -Will DC home today to follow up with her cardiologist in 2 weeks. -Appreciate cardiology input and recommendations.  Rest of chronic medical issues are stable and home medications have not been altered.  Procedures:  None   Consultations:  Cardiology, Dr. Harl Bowie  Discharge Instructions  Discharge Instructions    Diet - low sodium heart healthy    Complete by:  As directed      Increase activity slowly    Complete by:  As directed             Medication List    TAKE these medications        aspirin EC 81 MG tablet  Take 81 mg by mouth daily.     atorvastatin 40 MG tablet  Commonly known as:  LIPITOR  Take 40 mg by mouth daily.     carvedilol 6.25 MG tablet  Commonly known as:  COREG  Take 6.25 mg by mouth 2 (two) times daily with a meal.     HUMULIN N KWIKPEN 100 UNIT/ML Kiwkpen  Generic drug:  Insulin NPH (Human) (Isophane)  Inject 50 Units into the skin 2 (two) times daily.     isosorbide mononitrate 30 MG 24 hr tablet  Commonly known as:  IMDUR  Take 30 mg by mouth daily.     LANTUS SOLOSTAR 100  UNIT/ML injection  Generic drug:  insulin glargine  Inject 50 Units into the skin at bedtime.     omeprazole 20 MG tablet  Commonly known as:  PRILOSEC OTC  Take 20 mg by mouth daily.     PROAIR RESPICLICK 109 (90 BASE) MCG/ACT Aepb  Generic drug:  Albuterol Sulfate  Inhale 2 puffs into the lungs every 4 (four) hours as needed.     sulfamethoxazole-trimethoprim 800-160 MG per tablet  Commonly known as:  BACTRIM DS,SEPTRA DS  Take 1 tablet by mouth 2 (two) times daily. For 10 days     zolpidem 10 MG tablet  Commonly known as:  AMBIEN  Take 1 tablet (10 mg total) by mouth at bedtime.       Allergies  Allergen Reactions  . Tape Rash    ADHESIVE TAPE       Follow-up Information    Follow up with Jory Sims, NP In 2 weeks.   Specialties:  Nurse Practitioner, Radiology, Cardiology   Contact information:   [address] [place] [address] [phone]        The results of significant diagnostics from this hospitalization (including imaging, microbiology, ancillary and laboratory) are listed below for reference.    Significant Diagnostic Studies: Dg Chest 2 View  06/01/2015   CLINICAL DATA:  Initial encounter for nonproductive cough and chest congestion for 2 weeks. Mid chest pain and shortness of Breath starting this morning.  EXAM: CHEST  2 VIEW  COMPARISON:  09/03/2014.  FINDINGS: The lungs are clear without focal pneumonia, edema, pneumothorax or pleural effusion. Hyperexpansion is consistent with emphysema. The cardiopericardial silhouette is within normal limits for size. Imaged bony structures of the thorax are intact. Telemetry leads overlie the chest.  IMPRESSION: Hyperexpansion without acute cardiopulmonary findings.   Electronically Signed   By: Misty Stanley M.D.   On: 06/01/2015 11:06   US Abdomen Limited Ruq  05/16/2015   CLINICAL DATA:  History of hepatic cirrhosis, fatty liver disease, hypercholesterolemia.  EXAM: US ABDOMEN LIMITED - RIGHT UPPER  QUADRANT  COMPARISON:  Abdominal ultrasound of October 12, 2014  FINDINGS: Gallbladder:  No gallstones or wall thickening visualized. No sonographic Murphy sign noted.  Common bile duct:  Diameter: 3.5 mm  Liver:  The hepatic echotexture is heterogeneous. The surface contour is irregular. There is no focal hepatic mass and no ductal dilation. The echotexture is mildly increased.  IMPRESSION: 1. Stable cirrhotic changes and changes of fatty infiltration. There is no hepatic mass or ductal dilation. 2. The gallbladder and common bile duct are unremarkable.   Electronically Signed   By: David  Martinique M.D.   On: 05/16/2015 09:55    Microbiology: No results found for this or any previous visit (from the past 240 hour(s)).   Labs: Basic Metabolic Panel:  Recent Labs Lab 06/01/15 1020  NA 141  K 4.4  CL 109  CO2 25  GLUCOSE 93  BUN 18  CREATININE 0.95  CALCIUM 9.1   Liver Function Tests: No results for input(s): AST, ALT, ALKPHOS, BILITOT, PROT, ALBUMIN in the last 168 hours. No results for input(s): LIPASE, AMYLASE in the last 168 hours. No results for input(s): AMMONIA in the last 168 hours. CBC:  Recent Labs Lab 06/01/15 1020  WBC 9.4  HGB 13.6  HCT 39.9  MCV 91.7  PLT 142*   Cardiac Enzymes:  Recent Labs Lab 06/01/15 1020 06/01/15 1823 06/02/15 0603  TROPONINI <0.03 <0.03 <0.03   BNP: BNP (last 3 results) No results for input(s): BNP in the last 8760 hours.  ProBNP (last 3 results) No results for input(s): PROBNP in the last 8760 hours.  CBG:  Recent Labs Lab 06/01/15 1617 06/01/15 2243 06/02/15 0732  GLUCAP 94 136* 73       Signed:  HERNANDEZ ACOSTA,ESTELA  [facility] Pager: [phone] 06/02/2015, 12:05 PM

## 2015-06-02 NOTE — Progress Notes (Signed)
Patient discharged home.  IV removed - WNL.  No changes to meds.  Education given on CP and instructed to always seek medical care for CP.  Instructed to follow up with cardio in 2 weeks.  Verbalizes understanding, no questions at this time.  Stable to DC home, assisted off unit via [place] by RN

## 2015-06-22 DIAGNOSIS — E1142 Type 2 diabetes mellitus with diabetic polyneuropathy: Secondary | ICD-10-CM | POA: Diagnosis not present

## 2015-06-22 DIAGNOSIS — B351 Tinea unguium: Secondary | ICD-10-CM | POA: Diagnosis not present

## 2015-07-09 ENCOUNTER — Emergency Department (HOSPITAL_COMMUNITY): Payer: Medicare Other

## 2015-07-09 ENCOUNTER — Encounter (HOSPITAL_COMMUNITY): Payer: Self-pay | Admitting: Emergency Medicine

## 2015-07-09 ENCOUNTER — Emergency Department (HOSPITAL_COMMUNITY)
Admission: EM | Admit: 2015-07-09 | Discharge: 2015-07-09 | Disposition: A | Discharge status: Home / Self Care | Payer: Medicare Other | Attending: Emergency Medicine | Admitting: Emergency Medicine

## 2015-07-09 DIAGNOSIS — R112 Nausea with vomiting, unspecified: Secondary | ICD-10-CM

## 2015-07-09 DIAGNOSIS — K219 Gastro-esophageal reflux disease without esophagitis: Secondary | ICD-10-CM | POA: Insufficient documentation

## 2015-07-09 DIAGNOSIS — I2510 Atherosclerotic heart disease of native coronary artery without angina pectoris: Secondary | ICD-10-CM | POA: Insufficient documentation

## 2015-07-09 DIAGNOSIS — I252 Old myocardial infarction: Secondary | ICD-10-CM | POA: Insufficient documentation

## 2015-07-09 DIAGNOSIS — Z8739 Personal history of other diseases of the musculoskeletal system and connective tissue: Secondary | ICD-10-CM | POA: Diagnosis not present

## 2015-07-09 DIAGNOSIS — I10 Essential (primary) hypertension: Secondary | ICD-10-CM | POA: Insufficient documentation

## 2015-07-09 DIAGNOSIS — Z7982 Long term (current) use of aspirin: Secondary | ICD-10-CM | POA: Diagnosis not present

## 2015-07-09 DIAGNOSIS — Z9889 Other specified postprocedural states: Secondary | ICD-10-CM | POA: Insufficient documentation

## 2015-07-09 DIAGNOSIS — R1084 Generalized abdominal pain: Secondary | ICD-10-CM | POA: Insufficient documentation

## 2015-07-09 DIAGNOSIS — Z79899 Other long term (current) drug therapy: Secondary | ICD-10-CM | POA: Insufficient documentation

## 2015-07-09 DIAGNOSIS — Z862 Personal history of diseases of the blood and blood-forming organs and certain disorders involving the immune mechanism: Secondary | ICD-10-CM | POA: Diagnosis not present

## 2015-07-09 DIAGNOSIS — Z8673 Personal history of transient ischemic attack (TIA), and cerebral infarction without residual deficits: Secondary | ICD-10-CM | POA: Insufficient documentation

## 2015-07-09 DIAGNOSIS — R200 Anesthesia of skin: Secondary | ICD-10-CM | POA: Insufficient documentation

## 2015-07-09 DIAGNOSIS — R197 Diarrhea, unspecified: Secondary | ICD-10-CM | POA: Insufficient documentation

## 2015-07-09 DIAGNOSIS — E7800 Pure hypercholesterolemia, unspecified: Secondary | ICD-10-CM | POA: Diagnosis not present

## 2015-07-09 DIAGNOSIS — Z8669 Personal history of other diseases of the nervous system and sense organs: Secondary | ICD-10-CM | POA: Diagnosis not present

## 2015-07-09 DIAGNOSIS — Z794 Long term (current) use of insulin: Secondary | ICD-10-CM | POA: Insufficient documentation

## 2015-07-09 DIAGNOSIS — Z87440 Personal history of urinary (tract) infections: Secondary | ICD-10-CM | POA: Diagnosis not present

## 2015-07-09 DIAGNOSIS — E119 Type 2 diabetes mellitus without complications: Secondary | ICD-10-CM | POA: Insufficient documentation

## 2015-07-09 DIAGNOSIS — R202 Paresthesia of skin: Secondary | ICD-10-CM

## 2015-07-09 HISTORY — DX: Other intervertebral disc degeneration, lumbar region without mention of lumbar back pain or lower extremity pain: M51.369

## 2015-07-09 HISTORY — DX: Other intervertebral disc degeneration, lumbar region: M51.36

## 2015-07-09 LAB — PROTIME-INR
INR: 1.23 (ref 0.00–1.49)
Prothrombin Time: 15.7 seconds — ABNORMAL HIGH (ref 11.6–15.2)

## 2015-07-09 LAB — URINALYSIS, ROUTINE W REFLEX MICROSCOPIC
Bilirubin Urine: NEGATIVE
Glucose, UA: NEGATIVE mg/dL
HGB URINE DIPSTICK: NEGATIVE
Ketones, ur: NEGATIVE mg/dL
LEUKOCYTES UA: NEGATIVE
NITRITE: NEGATIVE
PROTEIN: NEGATIVE mg/dL
SPECIFIC GRAVITY, URINE: 1.015 (ref 1.005–1.030)
UROBILINOGEN UA: 0.2 mg/dL (ref 0.0–1.0)
pH: 5.5 (ref 5.0–8.0)

## 2015-07-09 LAB — CBC WITH DIFFERENTIAL/PLATELET
Basophils Absolute: 0 10*3/uL (ref 0.0–0.1)
Basophils Relative: 0 %
EOS PCT: 1 %
Eosinophils Absolute: 0.1 10*3/uL (ref 0.0–0.7)
HCT: 39.4 % (ref 36.0–46.0)
HEMOGLOBIN: 13.3 g/dL (ref 12.0–15.0)
LYMPHS ABS: 1 10*3/uL (ref 0.7–4.0)
LYMPHS PCT: 12 %
MCH: 31.1 pg (ref 26.0–34.0)
MCHC: 33.8 g/dL (ref 30.0–36.0)
MCV: 92.3 fL (ref 78.0–100.0)
MONOS PCT: 4 %
Monocytes Absolute: 0.3 10*3/uL (ref 0.1–1.0)
Neutro Abs: 6.6 10*3/uL (ref 1.7–7.7)
Neutrophils Relative %: 83 %
Platelets: 129 10*3/uL — ABNORMAL LOW (ref 150–400)
RBC: 4.27 MIL/uL (ref 3.87–5.11)
RDW: 13.9 % (ref 11.5–15.5)
WBC: 8 10*3/uL (ref 4.0–10.5)

## 2015-07-09 LAB — LIPASE, BLOOD: Lipase: 29 U/L (ref 11–51)

## 2015-07-09 LAB — COMPREHENSIVE METABOLIC PANEL
ALBUMIN: 4 g/dL (ref 3.5–5.0)
ALK PHOS: 94 U/L (ref 38–126)
ALT: 28 U/L (ref 14–54)
AST: 31 U/L (ref 15–41)
Anion gap: 8 (ref 5–15)
BUN: 19 mg/dL (ref 6–20)
CHLORIDE: 111 mmol/L (ref 101–111)
CO2: 21 mmol/L — AB (ref 22–32)
CREATININE: 0.96 mg/dL (ref 0.44–1.00)
Calcium: 8.9 mg/dL (ref 8.9–10.3)
GFR calc non Af Amer: 58 mL/min — ABNORMAL LOW (ref >60)
GLUCOSE: 150 mg/dL — AB (ref 65–99)
Potassium: 3.8 mmol/L (ref 3.5–5.1)
SODIUM: 140 mmol/L (ref 135–145)
Total Bilirubin: 0.7 mg/dL (ref 0.3–1.2)
Total Protein: 6.9 g/dL (ref 6.5–8.1)

## 2015-07-09 MED ORDER — ONDANSETRON HCL 4 MG PO TABS: 4.0000 mg | ORAL_TABLET | Freq: Three times a day (TID) | ORAL | Status: DC | PRN

## 2015-07-09 MED ORDER — IOHEXOL 300 MG/ML  SOLN
25.0000 mL | Freq: Once | INTRAMUSCULAR | Status: AC | PRN
  Administered 2015-07-09: 25 mL via ORAL

## 2015-07-09 MED ORDER — DICYCLOMINE HCL 10 MG/ML IM SOLN
10.0000 mg | Freq: Once | INTRAMUSCULAR | Status: AC
  Administered 2015-07-09: 10 mg via INTRAMUSCULAR
  Filled 2015-07-09: qty 2

## 2015-07-09 MED ORDER — ONDANSETRON HCL 4 MG/2ML IJ SOLN
4.0000 mg | INTRAMUSCULAR | Status: DC | PRN
  Administered 2015-07-09: 4 mg via INTRAVENOUS
  Filled 2015-07-09: qty 2

## 2015-07-09 MED ORDER — SODIUM CHLORIDE 0.9 % IV SOLN
INTRAVENOUS | Status: DC
  Administered 2015-07-09: 75 mL/h via INTRAVENOUS

## 2015-07-09 MED ORDER — IOHEXOL 300 MG/ML  SOLN
100.0000 mL | Freq: Once | INTRAMUSCULAR | Status: AC | PRN
  Administered 2015-07-09: 100 mL via INTRAVENOUS

## 2015-07-09 NOTE — ED Provider Notes (Signed)
CSN: [id number]     Arrival date & time 07/09/15  9924 History   First MD Initiated Contact with Patient 07/09/15 [phone]     Chief Complaint  Patient presents with  . Emesis  . Diarrhea  . Numbness    left foot.     HPI Pt was seen at [place]. Per pt, c/o gradual onset and persistence of multiple intermittent episodes of N/V/D that began overnight last night. Describes the stools as "watery." Has been associated with generalized abd "soreness." Denies CP/SOB, no back pain, no fevers, no black or blood in stools or emesis. Pt also states she woke up at 0400 and felt an area on her left anterior ankle and dorsal foot "was numb." Pt states the numbness occurs "when I move my foot and ankle around." Cannot recall injury. Denies rash, no focal motor weakness.     Past Medical History  Diagnosis Date  . Back pain   . GERD (gastroesophageal reflux disease)   . MI (myocardial infarction) ([place])   . Hypercholesteremia   . Anemia   . Cirrhosis ([place])   . Aortic sclerosis ([place]) 07/04/2011  . Aortic insufficiency 07/04/2011    most recent 2D Echo 02/24/2012  EF greater than 55%  . Carotid stenosis, bilateral 07/04/2011    most recent 05/2010 carotid doppler  . Presence of stent in right coronary artery 07/04/2011  . HTN (hypertension) 07/04/2011  . Coronary artery disease   . Myocardial infarction ([place]) 1995  . GERD (gastroesophageal reflux disease)   . Non-alcoholic fatty liver disease   . UTI (lower urinary tract infection)     chronic/recurring/on bactrim  . CVA (cerebral infarction)     03/07/2014  . Hiatal hernia   . IBS (irritable bowel syndrome)   . Diabetes mellitus 2005  . Varices, esophageal ([place])   . Stroke ([place]) 03/07/2014    no deficits from stroke  . OSA (obstructive sleep apnea) 07/04/2011    sleep study 2009  AHI 9.91/hr and during REM sleep 35.58/hr  . DDD (degenerative disc disease), lumbar    Past Surgical History  Procedure Laterality Date  . Back surgery  1985  .  Tonsillectomy    . Appendectomy    . Colonoscopy    . Eye surgery  08    cataract surgery of the left eye  . Ercp    . Myringotomy  2012    both ears  . Hardware removal Right 01/17/2013    Procedure: REMOVAL OF HARDWARE AND EXCISION ULNAR STYLOID RIGHT WRIST;  Surgeon: Tennis Must, MD;  Location: [facility];  Service: Orthopedics;  Laterality: Right;  . Wrist surgery      rt wrist hardwear removal  . Esophagogastroduodenoscopy N/A 04/01/2013    Procedure: ESOPHAGOGASTRODUODENOSCOPY (EGD);  Surgeon: Rogene Houston, MD;  [facility];  Service: Endoscopy;  Laterality: N/A;  230-rescheduled to 8:30am Ann notified pt  . Esophageal banding N/A 04/01/2013    Procedure: ESOPHAGEAL BANDING;  Surgeon: Rogene Houston, MD;  [facility];  Service: Endoscopy;  Laterality: N/A;  . Esophagogastroduodenoscopy N/A 05/24/2013    Procedure: ESOPHAGOGASTRODUODENOSCOPY (EGD);  Surgeon: Rogene Houston, MD;  [facility];  Service: Endoscopy;  Laterality: N/A;  730  . Esophageal banding N/A 05/24/2013    Procedure: ESOPHAGEAL BANDING;  Surgeon: Rogene Houston, MD;  [facility];  Service: Endoscopy;  Laterality: N/A;  . Dilation and curettage of uterus  x2  . Vaginal hysterectomy  1972  . Anal fissure repair  1972  . Epi retinal membrane peel Left   . Esophagogastroduodenoscopy N/A 06/21/2014    Procedure: ESOPHAGOGASTRODUODENOSCOPY (EGD);  Surgeon: Rogene Houston, MD;  [facility];  Service: Endoscopy;  Laterality: N/A;  930-rescheduled 10/14 @ 1200 Ann to notify pt  . Esophageal banding N/A 06/21/2014    Procedure: ESOPHAGEAL BANDING;  Surgeon: Rogene Houston, MD;  [facility];  Service: Endoscopy;  Laterality: N/A;  . Cardiac catheterization  1995.,2006,1997    stent to the proximal RCA after MI   . Cataract extraction w/phaco Right 07/04/2014    Procedure: CATARACT EXTRACTION PHACO AND INTRAOCULAR LENS PLACEMENT  (IOC);  Surgeon: Elta Guadeloupe T. Gershon Crane, MD;  Location: [facility];  Service: Ophthalmology;  Laterality: Right;  CDE:13.13   Family History  Problem Relation Age of Onset  . Diabetes Mother   . Heart failure Father    Social History  Substance Use Topics  . Smoking status: Former Smoker -- 0.25 packs/day for 50 years    Types: Cigarettes    Quit date: 01/13/2012  . Smokeless tobacco: Never Used  . Alcohol Use: No    Review of Systems ROS: Statement: All systems negative except as marked or noted in the HPI; Constitutional: Negative for fever and chills. ; ; Eyes: Negative for eye pain, redness and discharge. ; ; ENMT: Negative for ear pain, hoarseness, nasal congestion, sinus pressure and sore throat. ; ; Cardiovascular: Negative for chest pain, palpitations, diaphoresis, dyspnea and peripheral edema. ; ; Respiratory: Negative for cough, wheezing and stridor. ; ; Gastrointestinal: +N/V/D, abd pain. Negative for blood in stool, hematemesis, jaundice and rectal bleeding. . ; ; Genitourinary: Negative for dysuria, flank pain and hematuria. ; ; Musculoskeletal: +area on left anterior ankle and dorsal foot "numbness." Negative for back pain and neck pain. Negative for swelling and trauma.; ; Skin: Negative for pruritus, rash, abrasions, blisters, bruising and skin lesion.; ; Neuro: Negative for headache, lightheadedness and neck stiffness. Negative for weakness, altered level of consciousness , altered mental status, extremity weakness, involuntary movement, seizure and syncope.      Allergies  Tape  Home Medications   Prior to Admission medications   Medication Sig Start Date End Date Taking? Authorizing Provider  aspirin EC 81 MG tablet Take 81 mg by mouth daily.    Historical Provider, MD  atorvastatin (LIPITOR) 40 MG tablet Take 40 mg by mouth daily.    Historical Provider, MD  carvedilol (COREG) 6.25 MG tablet Take 6.25 mg by mouth 2 (two) times daily with a meal.    Historical Provider, MD   HUMULIN N KWIKPEN 100 UNIT/ML Kiwkpen Inject 50 Units into the skin 2 (two) times daily.  07/11/14   Historical Provider, MD  insulin glargine (LANTUS SOLOSTAR) 100 UNIT/ML injection Inject 50 Units into the skin at bedtime.     Historical Provider, MD  isosorbide mononitrate (IMDUR) 30 MG 24 hr tablet Take 30 mg by mouth daily.    Historical Provider, MD  omeprazole (PRILOSEC OTC) 20 MG tablet Take 20 mg by mouth daily.    Historical Provider, MD  PROAIR RESPICLICK 492 (90 BASE) MCG/ACT AEPB Inhale 2 puffs into the lungs every 4 (four) hours as needed. 05/24/15   Historical Provider, MD  sulfamethoxazole-trimethoprim (BACTRIM DS,SEPTRA DS) 800-160 MG per tablet Take 1 tablet by mouth 2 (two) times daily. For 10 days    Historical Provider, MD  zolpidem Lorrin Mais)  10 MG tablet Take 1 tablet (10 mg total) by mouth at bedtime. 04/19/15   Terri L Setzer, NP   BP 180/73 mmHg  Pulse 92  Temp(Src) 97.9 F (36.6 C) (Oral)  Resp 18  Ht 5' 3"  (1.6 m)  Wt 160 lb (72.576 kg)  BMI 28.35 kg/m2  SpO2 98% Physical Exam  0825: Physical examination:  Nursing notes reviewed; Vital signs and O2 SAT reviewed;  Constitutional: Well developed, Well nourished, Well hydrated, In no acute distress; Head:  Normocephalic, atraumatic; Eyes: EOMI, PERRL, No scleral icterus; ENMT: Mouth and pharynx normal, Mucous membranes moist; Neck: Supple, Full range of motion, No lymphadenopathy; Cardiovascular: Regular rate and rhythm, No gallop; Respiratory: Breath sounds clear & equal bilaterally, No wheezes.  Speaking full sentences with ease, Normal respiratory effort/excursion; Chest: Nontender, Movement normal; Abdomen: Soft, +mild diffuse tenderness to palp. No rebound or guarding. Nondistended, Normal bowel sounds; Genitourinary: No CVA tenderness; Spine:  No midline CS, TS, LS tenderness.;; Extremities: Pulses normal, No deformity. No tenderness, No edema, No calf edema or asymmetry.  NMS intact left ankle and foot, strong pedal  pp, LE muscle compartments soft.  No left proximal fibular head tenderness, no knee tenderness, no ankle tenderness, no foot tenderness.  No deformity, no ecchymosis, no erythema, no open wounds.  +plantarflexion of left foot w/calf squeeze.  No palpable gap left Achilles's tendon.;; Neuro: AA&Ox3, Major CN grossly intact. Speech clear.  No facial droop.  No nystagmus. Grips equal. Strength 5/5 equal bilat UE's and LE's, including great toe dorsiflexion.  DTR 2/4 equal bilat UE's and LE's.  No gross sensory deficits.  Normal cerebellar testing bilat UE's (finger-nose) and LE's (heel-shin). Neg straight leg raises bilat. Climbs on and off stretcher easily by herself. Gait steady.; Skin: Color normal, Warm, Dry.   ED Course  Procedures (including critical care time) Labs Review   Imaging Review  I have personally reviewed and evaluated these images and lab results as part of my medical decision-making.   EKG Interpretation None      MDM  MDM Reviewed: previous chart, nursing note and vitals Reviewed previous: labs Interpretation: labs and CT scan      Results for orders placed or performed during the hospital encounter of 07/09/15  Urinalysis, Routine w reflex microscopic  Result Value Ref Range   Color, Urine YELLOW YELLOW   APPearance CLEAR CLEAR   Specific Gravity, Urine 1.015 1.005 - 1.030   pH 5.5 5.0 - 8.0   Glucose, UA NEGATIVE NEGATIVE mg/dL   Hgb urine dipstick NEGATIVE NEGATIVE   Bilirubin Urine NEGATIVE NEGATIVE   Ketones, ur NEGATIVE NEGATIVE mg/dL   Protein, ur NEGATIVE NEGATIVE mg/dL   Urobilinogen, UA 0.2 0.0 - 1.0 mg/dL   Nitrite NEGATIVE NEGATIVE   Leukocytes, UA NEGATIVE NEGATIVE  Comprehensive metabolic panel  Result Value Ref Range   Sodium 140 135 - 145 mmol/L   Potassium 3.8 3.5 - 5.1 mmol/L   Chloride 111 101 - 111 mmol/L   CO2 21 (L) 22 - 32 mmol/L   Glucose, Bld 150 (H) 65 - 99 mg/dL   BUN 19 6 - 20 mg/dL   Creatinine, Ser 0.96 0.44 - 1.00 mg/dL    Calcium 8.9 8.9 - 10.3 mg/dL   Total Protein 6.9 6.5 - 8.1 g/dL   Albumin 4.0 3.5 - 5.0 g/dL   AST 31 15 - 41 U/L   ALT 28 14 - 54 U/L   Alkaline Phosphatase 94 38 - 126 U/L  Total Bilirubin 0.7 0.3 - 1.2 mg/dL   GFR calc non Af Amer 58 (L) >60 mL/min   GFR calc Af Amer >60 >60 mL/min   Anion gap 8 5 - 15  Lipase, blood  Result Value Ref Range   Lipase 29 11 - 51 U/L  CBC with Differential  Result Value Ref Range   WBC 8.0 4.0 - 10.5 K/uL   RBC 4.27 3.87 - 5.11 MIL/uL   Hemoglobin 13.3 12.0 - 15.0 g/dL   HCT 39.4 36.0 - 46.0 %   MCV 92.3 78.0 - 100.0 fL   MCH 31.1 26.0 - 34.0 pg   MCHC 33.8 30.0 - 36.0 g/dL   RDW 13.9 11.5 - 15.5 %   Platelets 129 (L) 150 - 400 K/uL   Neutrophils Relative % 83 %   Neutro Abs 6.6 1.7 - 7.7 K/uL   Lymphocytes Relative 12 %   Lymphs Abs 1.0 0.7 - 4.0 K/uL   Monocytes Relative 4 %   Monocytes Absolute 0.3 0.1 - 1.0 K/uL   Eosinophils Relative 1 %   Eosinophils Absolute 0.1 0.0 - 0.7 K/uL   Basophils Relative 0 %   Basophils Absolute 0.0 0.0 - 0.1 K/uL  Protime-INR  Result Value Ref Range   Prothrombin Time 15.7 (H) 11.6 - 15.2 seconds   INR 1.23 0.00 - 1.49   Dg Ankle Complete Left 07/09/2015  CLINICAL DATA:  Acute onset of numbness of the foot and ankle beginning 5 hours ago. No known injury. EXAM: LEFT ANKLE COMPLETE - 3+ VIEW COMPARISON:  02/24/2012 FINDINGS: There is no evidence of fracture, dislocation, or joint effusion. There is no evidence of arthropathy or other focal bone abnormality. Soft tissues are unremarkable. IMPRESSION: Negative. Electronically Signed   By: Nelson Chimes M.D.   On: 07/09/2015 09:13   Ct Abdomen Pelvis W Contrast 07/09/2015  ADDENDUM REPORT: 07/09/2015 10:12 ADDENDUM: Not mentioned in the impression is the following: 8 mm right lower lobe pulmonary nodule abutting the diaphragm. If the patient is at high risk for bronchogenic carcinoma, follow-up chest CT at 3-6 months is recommended. If the patient is at low  risk for bronchogenic carcinoma, follow-up chest CT at 6-12 months is recommended. This recommendation follows the consensus statement: "Guidelines for Management of Small Pulmonary Nodules Detected on CT Scans: A Statement from the [place]" as published in Radiology 2005; 237:395-400. Electronically Signed   By: Kathreen Devoid   On: 07/09/2015 10:12  07/09/2015  CLINICAL DATA:  Generalized abdominal pain. Nausea, vomiting, diarrhea. EXAM: CT ABDOMEN AND PELVIS WITH CONTRAST TECHNIQUE: Multidetector CT imaging of the abdomen and pelvis was performed using the standard protocol following bolus administration of intravenous contrast. CONTRAST:  29m OMNIPAQUE IOHEXOL 300 MG/ML SOLN, 1076mOMNIPAQUE IOHEXOL 300 MG/ML SOLN COMPARISON:  None. FINDINGS: Lower chest: 8 mm right lower lobe pulmonary nodule abutting the diaphragm. Normal heart size. Dense mitral annular calcification. Coronary artery atherosclerosis in the RCA. Paraesophageal serpiginous vascular structures are noted most concerning for varices. Hepatobiliary: No focal hepatic mass. Nodular contour of the liver with a diminutive size most concerning for cirrhosis. Tiny cholelithiasis dependently in the gallbladder which is otherwise normal. Pancreas: Normal. Spleen: No splenic mass. Multiple calcifications within the spleen likely reflecting sequela of prior granulomatous disease. Adrenals/Urinary Tract: Normal adrenal glands. 2.1 cm hypodense, fluid attenuating left interpolar renal mass most consistent with a cyst. No urolithiasis or obstructive uropathy. No solid renal mass. Normal bladder. Stomach/Bowel: No bowel wall thickening or dilatation. No  contrast extravasation. Diverticulosis without evidence of diverticulitis of the sigmoid colon. No pneumatosis, pneumoperitoneum or portal venous gas. No abdominal or pelvic free fluid. Vascular/Lymphatic: Normal caliber abdominal aorta with atherosclerosis. No abdominal or pelvic lymphadenopathy.  Reproductive: Prior hysterectomy.  No adnexal mass. Other: No fluid collection or hematoma. Musculoskeletal: No aggressive lytic or sclerotic osseous lesion. Bilateral L5 pars interarticularis defects with grade 1 anterolisthesis of L5 on S1. Severe degenerative disc disease with disc height loss at L5-S1. IMPRESSION: 1. No acute abdominal pelvic pathology. 2. Diverticulosis without evidence of diverticulitis. 3. Nodular contour of the liver with a diminutive size and with lower paraesophageal varices most concerning for cirrhosis with portal hypertension. 4. Grade 1 anterolisthesis of L5 on S1 secondary to bilateral pars interarticularis defects. Electronically Signed: By: Kathreen Devoid On: 07/09/2015 09:54   Dg Foot Complete Left 07/09/2015  CLINICAL DATA:  Acute onset of numbness of the foot and ankle beginning 5 hours ago. No known injury. EXAM: LEFT FOOT - COMPLETE 3+ VIEW COMPARISON:  None. FINDINGS: There is no evidence of fracture or dislocation. There is no evidence of arthropathy or other focal bone abnormality. Soft tissues are unremarkable. IMPRESSION: Negative. Electronically Signed   By: Nelson Chimes M.D.   On: 07/09/2015 09:13    1100:  Pt has tol PO well while in the ED without N/V.  No stooling while in the ED.  Abd benign, VSS. Pt has ambulated with steady gait, NAD. Neuro exam remains intact and unchanged; doubt TIA/CVA as cause for very focal subjective "numbness" of her dorsal foot/anterior ankle. Foot is well perfused; doubt arterial compromise. Feels better and wants to go home now. Dx and testing d/w pt.  Questions answered.  Verb understanding, agreeable to d/c home with outpt f/u.     Francine Graven, DO 07/12/15 [phone]

## 2015-07-09 NOTE — Discharge Instructions (Signed)
Emergency Department Resource Guide 1) Find a Doctor and Pay Out of Pocket Although you won't have to find out who is covered by your insurance plan, it is a good idea to ask around and get recommendations. You will then need to call the office and see if the doctor you have chosen will accept you as a new patient and what types of options they offer for patients who are self-pay. Some doctors offer discounts or will set up payment plans for their patients who do not have insurance, but you will need to ask so you aren't surprised when you get to your appointment.  2) Contact Your Local Health Department Not all health departments have doctors that can see patients for sick visits, but many do, so it is worth a call to see if yours does. If you don't know where your local health department is, you can check in your phone book. The CDC also has a tool to help you locate your state's health department, and many state websites also have listings of all of their local health departments.  3) Find a [facility] If your illness is not likely to be very severe or complicated, you may want to try a walk in clinic. These are popping up all over the country in pharmacies, drugstores, and shopping centers. They're usually staffed by nurse practitioners or physician assistants that have been trained to treat common illnesses and complaints. They're usually fairly quick and inexpensive. However, if you have serious medical issues or chronic medical problems, these are probably not your best option.  No Primary Care Doctor: - Call Health Connect at  [phone] - they can help you locate a primary care doctor that  accepts your insurance, provides certain services, etc. - Physician Referral Service- [phone]  Chronic Pain Problems: Organization         Address  Phone   Notes  [facility]  [phone] Patients need to be referred by their primary care doctor.   Medication  Assistance: Organization         Address  Phone   Notes  [facility] Medication [facility] [place], [place] [address] [phone] --Must be a resident of [facility] -- Must have NO insurance coverage whatsoever (no Medicaid/ Medicare, etc.) -- The pt. MUST have a primary care doctor that directs their care regularly and follows them in the community   [facility]  [phone]   [employer]  [phone]    Agencies that provide inexpensive medical care: Organization         Address  Phone   Notes  [place]  [phone]   [facility]    [phone]   [facility] [place], [place] [address] [phone]   [place] [address] [place] [phone]   Planned Parenthood    [phone]   [facility]    [phone]   [place] and [place] [address], [place] Phone:  [phone], Fax:  [phone] Hours of Operation:  9 am - 6 pm, M-F.  Also accepts Medicaid/Medicare and self-pay.  [facility] for [place] [address], [address], [place] Phone: [phone], Fax: [phone]. Hours of Operation:  8:30 am - 5:30 pm, M-F.  Also accepts Medicaid and self-pay.  [facility] [address], [place] Phone: [phone]   [place], [place], [place] [phone], Ext. [phone] Mondays & Thursdays: 7-9 AM.  First 15 patients are seen on a first come, first serve basis.    [place] Providers:  Organization         Address  Phone   Notes  [facility] [address], [place] [phone] Also accepts self-pay patients.  [facility] [address], [place]  [phone]   [place], [address], [place]  [phone]   [facility] [address] [place] [phone]   Lucianne Lei [address], [place]   [phone] Only accepts [place] Access [place] patients after they have their name applied to their card.   Self-Pay (no insurance) in [facility]:  Organization         Address  Phone   Notes  Sickle Cell Patients, [facility] [place] [phone]   [facility] [place] [phone]   [facility] [place]  [place], [place], [place] [phone]   [facility]/Dr. Osei-Bonsu  [address] [place] or [address], [place] [phone] Phone number for both [place] and [place] locations is the same.  [facility]gent [facility] and [facility] [address] [place] [phone]   [facility] [address] [place] or [address] [phone]   [facility] [address], [place] [phone], phone; [phone], fax Sees patients 1st and 3rd Saturday of every month.  Must not qualify for public or private insurance (i.e. Medicaid, Medicare, [place] Health Choice, Veterans' Benefits)  Household income should be no more than 200% of the poverty level The clinic cannot treat you if you are pregnant or think you are pregnant  Sexually transmitted diseases are not treated at the clinic.    Dental Care: Organization         Address  Phone  Notes  [facility] Department of [facility] [place] [phone] Accepts children up to age 89 who are enrolled in [place] or [place]; pregnant women with a Medicaid card; and children who have applied for Medicaid or [place] Health Choice, but were declined, whose parents can pay a reduced fee at time of service.  [facility]  Department of [facility]  [address], [place] [phone] Accepts children up to age 5 who are enrolled in [place] or [place]; pregnant women with a Medicaid card; and children who have applied for Medicaid or [place] Health Choice, but were declined, whose parents can pay a reduced fee at time of service.  [place] [facility] Access PRO[facility]  [place] [phone] Patients are seen by appointment only. Walk-ins are not accepted. [place] will see patients 39 years of age and older. Monday - Tuesday (8am-5pm) Most Wednesdays (8:30-5pm) $30 per visit, cash only  [facility] Adult [facility] Access PROGRAM  [address], [facility] [phone] Patients are seen by appointment only. Walk-ins are not accepted. [place] will see patients 6 years of age and older. One  Wednesday Evening (Monthly: Volunteer Based).  $30 per visit, cash only  [place]  [phone] for adults; Children under age 5, call Graduate Pediatric Dentistry at [phone]. Children aged 75-14, please call [phone] to request a pediatric application.  Dental services are provided in all areas of dental care including fillings, crowns and bridges, complete and partial dentures, implants, gum treatment, root canals, and extractions. Preventive care is also provided. Treatment is provided to both adults and children. Patients are selected via a lottery and there is often a waiting list.   [facility] [address] [place]  [phone] [email or web address]   [facility] [address] [place], [place] [phone], Ext. [phone] Second and Fourth Thursday of each month, opens at 6:30 AM; Clinic ends at 9 AM.  Patients are seen on a first-come first-served basis, and a limited number are seen during each clinic.   [facility]  [address] Hillard Danker [place], [place] [phone]    Eligibility Requirements You must have lived in [place], [place], or [place] counties for at least the last three months.   You cannot be eligible for state or federal sponsored [employer], including [employer], [place], or [employer].   You generally cannot be eligible for healthcare insurance through your employer.    How to apply: Eligibility screenings are held every Tuesday and Wednesday afternoon from 1:00 pm until 4:00 pm. You do not need an appointment for the interview!  [facility] [address] [place], [place]   [place]  [place] Department  [place]  [phone]    Behavioral Health Resources in the Community: Intensive Outpatient Programs Organization         Address  Phone  Notes  [facility] [place] [place]. [address], [place], [place] [phone]   [facility]patient [address], [place], [place]   ADS: [facility] [address], [place], [place]   [place] [address],  [place], [place] or [phone]   Substance Abuse Resources Organization         Address  Phone  Notes  Alcohol and Drug Services  [phone]   [place]  [phone]   [facility] [place]   Chinita Pester  [phone]   Residential & Outpatient Substance Abuse Program  [phone]   Psychological Services Organization         Address  Phone  Notes  [facility] [place]  [place]  [phone]   [place] [address], [place] or [phone]    Mobile Crisis Teams Organization         Address  Phone  Notes  [facility], Mobile Crisis Care Unit  [phone]   [facility]  [address]  [place], [place]   [place] [address] [place] [phone]    Self-Help/Support Groups Organization         Address  Phone             Notes  [place]. of [place] - variety of support groups  [place] Call for more information  Narcotics Anonymous (NA), [facility] [address], [employer] [place]  2 meetings at this location  Residential Treatment Programs Organization         Address  Phone  Notes  [facility] [address]    [place]  [phone]   [facility]  [address], [place] [phone], [place], [place]   [place], [place] [phone] Admissions: 8am-3pm M-F  Incentives Substance [place] [address],    [place], [place] [phone]   [facility] [address] [place], [place], [place]   [facility] [address],  [place], [place]   [facility] - Intensive Outpatient [address] Kristeen [address], [place], [place]   [facility] ([place].,  [place], [place] [phone] or [phone]   [facility] Services (RTS) [address] [place], [place] Accepts Medicaid  [facility] [place] [address]  [place] [phone] Substance Abuse/Addiction Treatment   [facility] Organization         Address  Phone  Notes  [facility]  [phone]   Domenic Schwab, PhD [address] Arlis [place], [place]   [phone] or [phone]   [place], [place] [phone]   [facility] [address], [place], [place] [phone] Insurance/Medicaid/sponsorship through [facility] and Families [address], [address] [place]                                    [place], [place] [phone] [place] [address], [place] [phone]    Dr. Adele Schilder  [phone]   [facility] Dept. 1) [address] [place] 2) [place] 3)  [address], [place] [phone]  [phone]   [place] [phone] or [phone] (After Hours)      Take the prescription as directed.  Increase your fluid intake (ie:  Gatoraide) for the next few days, as discussed.  Eat a bland diet and advance to your regular diet slowly as you can tolerate it.   Avoid full strength juices, as well as milk and milk products until your diarrhea has resolved. Your CT scan showed an incidental finding: "8 mm right lower lobe pulmonary nodule abutting the diaphragm. If the patient is at high risk for bronchogenic carcinoma, follow-up chest CT at 3-6 months is recommended. If the patient is at low risk for bronchogenic carcinoma, follow-up chest CT at 6-12 months is recommended."  Your family doctor can follow up this finding. Call your regular medical doctor today to schedule a follow up appointment this week.  Return to the Emergency Department immediately if not improving (or even worsening) despite taking the medicines as prescribed, any black or bloody stool or vomit, if you develop a fever over "101," or for any other concerns.

## 2015-07-09 NOTE — ED Notes (Signed)
Started with nausea and vomiting at 0400.  Vomited 2 times and had about 8 liquid stools.  C/o numbness to left foot.

## 2015-07-19 ENCOUNTER — Ambulatory Visit (INDEPENDENT_AMBULATORY_CARE_PROVIDER_SITE_OTHER): Payer: Medicare Other | Admitting: Otolaryngology

## 2015-07-19 DIAGNOSIS — H6123 Impacted cerumen, bilateral: Secondary | ICD-10-CM | POA: Diagnosis not present

## 2015-07-19 DIAGNOSIS — H7202 Central perforation of tympanic membrane, left ear: Secondary | ICD-10-CM | POA: Diagnosis not present

## 2015-08-16 ENCOUNTER — Ambulatory Visit (INDEPENDENT_AMBULATORY_CARE_PROVIDER_SITE_OTHER): Payer: Medicare Other | Admitting: Otolaryngology

## 2015-08-16 DIAGNOSIS — H903 Sensorineural hearing loss, bilateral: Secondary | ICD-10-CM

## 2015-08-16 DIAGNOSIS — H7202 Central perforation of tympanic membrane, left ear: Secondary | ICD-10-CM | POA: Diagnosis not present

## 2015-09-20 ENCOUNTER — Encounter (INDEPENDENT_AMBULATORY_CARE_PROVIDER_SITE_OTHER): Payer: Self-pay | Admitting: Internal Medicine

## 2015-10-11 DIAGNOSIS — Z1389 Encounter for screening for other disorder: Secondary | ICD-10-CM | POA: Diagnosis not present

## 2015-10-11 DIAGNOSIS — J069 Acute upper respiratory infection, unspecified: Secondary | ICD-10-CM | POA: Diagnosis not present

## 2015-10-13 ENCOUNTER — Encounter (HOSPITAL_COMMUNITY): Payer: Self-pay | Admitting: Emergency Medicine

## 2015-10-13 ENCOUNTER — Emergency Department (HOSPITAL_COMMUNITY)
Admission: EM | Admit: 2015-10-13 | Discharge: 2015-10-13 | Disposition: A | Discharge status: Home / Self Care | Payer: Medicare Other | Attending: Emergency Medicine | Admitting: Emergency Medicine

## 2015-10-13 DIAGNOSIS — K219 Gastro-esophageal reflux disease without esophagitis: Secondary | ICD-10-CM | POA: Insufficient documentation

## 2015-10-13 DIAGNOSIS — E119 Type 2 diabetes mellitus without complications: Secondary | ICD-10-CM | POA: Insufficient documentation

## 2015-10-13 DIAGNOSIS — I10 Essential (primary) hypertension: Secondary | ICD-10-CM | POA: Insufficient documentation

## 2015-10-13 DIAGNOSIS — I252 Old myocardial infarction: Secondary | ICD-10-CM | POA: Insufficient documentation

## 2015-10-13 DIAGNOSIS — Z9889 Other specified postprocedural states: Secondary | ICD-10-CM | POA: Diagnosis not present

## 2015-10-13 DIAGNOSIS — Z794 Long term (current) use of insulin: Secondary | ICD-10-CM | POA: Diagnosis not present

## 2015-10-13 DIAGNOSIS — R112 Nausea with vomiting, unspecified: Secondary | ICD-10-CM

## 2015-10-13 DIAGNOSIS — Z8673 Personal history of transient ischemic attack (TIA), and cerebral infarction without residual deficits: Secondary | ICD-10-CM | POA: Diagnosis not present

## 2015-10-13 DIAGNOSIS — Z87440 Personal history of urinary (tract) infections: Secondary | ICD-10-CM | POA: Insufficient documentation

## 2015-10-13 DIAGNOSIS — R109 Unspecified abdominal pain: Secondary | ICD-10-CM | POA: Insufficient documentation

## 2015-10-13 DIAGNOSIS — E860 Dehydration: Secondary | ICD-10-CM | POA: Insufficient documentation

## 2015-10-13 DIAGNOSIS — Z7982 Long term (current) use of aspirin: Secondary | ICD-10-CM | POA: Insufficient documentation

## 2015-10-13 DIAGNOSIS — I2510 Atherosclerotic heart disease of native coronary artery without angina pectoris: Secondary | ICD-10-CM | POA: Insufficient documentation

## 2015-10-13 DIAGNOSIS — E7800 Pure hypercholesterolemia, unspecified: Secondary | ICD-10-CM | POA: Diagnosis not present

## 2015-10-13 DIAGNOSIS — R197 Diarrhea, unspecified: Secondary | ICD-10-CM | POA: Diagnosis not present

## 2015-10-13 DIAGNOSIS — Z87891 Personal history of nicotine dependence: Secondary | ICD-10-CM | POA: Diagnosis not present

## 2015-10-13 DIAGNOSIS — Z862 Personal history of diseases of the blood and blood-forming organs and certain disorders involving the immune mechanism: Secondary | ICD-10-CM | POA: Insufficient documentation

## 2015-10-13 DIAGNOSIS — Z79899 Other long term (current) drug therapy: Secondary | ICD-10-CM | POA: Diagnosis not present

## 2015-10-13 DIAGNOSIS — Z792 Long term (current) use of antibiotics: Secondary | ICD-10-CM | POA: Insufficient documentation

## 2015-10-13 LAB — COMPREHENSIVE METABOLIC PANEL
ALBUMIN: 3.8 g/dL (ref 3.5–5.0)
ALK PHOS: 85 U/L (ref 38–126)
ALT: 19 U/L (ref 14–54)
ANION GAP: 8 (ref 5–15)
AST: 21 U/L (ref 15–41)
BILIRUBIN TOTAL: 0.7 mg/dL (ref 0.3–1.2)
BUN: 28 mg/dL — AB (ref 6–20)
CALCIUM: 8.9 mg/dL (ref 8.9–10.3)
CO2: 24 mmol/L (ref 22–32)
CREATININE: 1.2 mg/dL — AB (ref 0.44–1.00)
Chloride: 106 mmol/L (ref 101–111)
GFR calc Af Amer: 51 mL/min — ABNORMAL LOW (ref >60)
GFR calc non Af Amer: 44 mL/min — ABNORMAL LOW (ref >60)
GLUCOSE: 136 mg/dL — AB (ref 65–99)
Potassium: 3.5 mmol/L (ref 3.5–5.1)
SODIUM: 138 mmol/L (ref 135–145)
TOTAL PROTEIN: 6.6 g/dL (ref 6.5–8.1)

## 2015-10-13 LAB — CBC WITH DIFFERENTIAL/PLATELET
BASOS ABS: 0 10*3/uL (ref 0.0–0.1)
BASOS PCT: 0 %
EOS ABS: 0.3 10*3/uL (ref 0.0–0.7)
Eosinophils Relative: 4 %
HEMATOCRIT: 37.2 % (ref 36.0–46.0)
HEMOGLOBIN: 12.7 g/dL (ref 12.0–15.0)
Lymphocytes Relative: 13 %
Lymphs Abs: 1.1 10*3/uL (ref 0.7–4.0)
MCH: 31.5 pg (ref 26.0–34.0)
MCHC: 34.1 g/dL (ref 30.0–36.0)
MCV: 92.3 fL (ref 78.0–100.0)
MONOS PCT: 7 %
Monocytes Absolute: 0.6 10*3/uL (ref 0.1–1.0)
NEUTROS ABS: 6.4 10*3/uL (ref 1.7–7.7)
NEUTROS PCT: 76 %
Platelets: 123 10*3/uL — ABNORMAL LOW (ref 150–400)
RBC: 4.03 MIL/uL (ref 3.87–5.11)
RDW: 12.7 % (ref 11.5–15.5)
WBC: 8.4 10*3/uL (ref 4.0–10.5)

## 2015-10-13 LAB — URINALYSIS, ROUTINE W REFLEX MICROSCOPIC
Bilirubin Urine: NEGATIVE
GLUCOSE, UA: NEGATIVE mg/dL
HGB URINE DIPSTICK: NEGATIVE
Ketones, ur: NEGATIVE mg/dL
LEUKOCYTES UA: NEGATIVE
Nitrite: NEGATIVE
PH: 6 (ref 5.0–8.0)
PROTEIN: NEGATIVE mg/dL
Specific Gravity, Urine: >1.03 — ABNORMAL HIGH (ref 1.005–1.030)

## 2015-10-13 MED ORDER — PROMETHAZINE HCL 12.5 MG PO TABS: 12.5000 mg | ORAL_TABLET | Freq: Four times a day (QID) | ORAL | Status: DC | PRN

## 2015-10-13 MED ORDER — ONDANSETRON HCL 4 MG/2ML IJ SOLN
4.0000 mg | Freq: Once | INTRAMUSCULAR | Status: AC
  Administered 2015-10-13: 4 mg via INTRAVENOUS
  Filled 2015-10-13: qty 2

## 2015-10-13 MED ORDER — SODIUM CHLORIDE 0.9 % IV BOLUS (SEPSIS)
500.0000 mL | Freq: Once | INTRAVENOUS | Status: AC
  Administered 2015-10-13: 500 mL via INTRAVENOUS

## 2015-10-13 MED ORDER — PROMETHAZINE HCL 25 MG/ML IJ SOLN
12.5000 mg | Freq: Once | INTRAMUSCULAR | Status: AC
  Administered 2015-10-13: 12.5 mg via INTRAVENOUS
  Filled 2015-10-13: qty 1

## 2015-10-13 NOTE — Discharge Instructions (Signed)
Nausea and Vomiting Nausea is a sick feeling that often comes before throwing up (vomiting). Vomiting is a reflex where stomach contents come out of your mouth. Vomiting can cause severe loss of body fluids (dehydration). Children and elderly adults can become dehydrated quickly, especially if they also have diarrhea. Nausea and vomiting are symptoms of a condition or disease. It is important to find the cause of your symptoms. CAUSES   Direct irritation of the stomach lining. This irritation can result from increased acid production (gastroesophageal reflux disease), infection, food poisoning, taking certain medicines (such as nonsteroidal anti-inflammatory drugs), alcohol use, or tobacco use.  Signals from the brain.These signals could be caused by a headache, heat exposure, an inner ear disturbance, increased pressure in the brain from injury, infection, a tumor, or a concussion, pain, emotional stimulus, or metabolic problems.  An obstruction in the gastrointestinal tract (bowel obstruction).  Illnesses such as diabetes, hepatitis, gallbladder problems, appendicitis, kidney problems, cancer, sepsis, atypical symptoms of a heart attack, or eating disorders.  Medical treatments such as chemotherapy and radiation.  Receiving medicine that makes you sleep (general anesthetic) during surgery. DIAGNOSIS Your caregiver may ask for tests to be done if the problems do not improve after a few days. Tests may also be done if symptoms are severe or if the reason for the nausea and vomiting is not clear. Tests may include:  Urine tests.  Blood tests.  Stool tests.  Cultures (to look for evidence of infection).  X-rays or other imaging studies. Test results can help your caregiver make decisions about treatment or the need for additional tests. TREATMENT You need to stay well hydrated. Drink frequently but in small amounts.You may wish to drink water, sports drinks, clear broth, or eat frozen  ice pops or gelatin dessert to help stay hydrated.When you eat, eating slowly may help prevent nausea.There are also some antinausea medicines that may help prevent nausea. HOME CARE INSTRUCTIONS   Take all medicine as directed by your caregiver.  If you do not have an appetite, do not force yourself to eat. However, you must continue to drink fluids.  If you have an appetite, eat a normal diet unless your caregiver tells you differently.  Eat a variety of complex carbohydrates (rice, wheat, potatoes, bread), lean meats, yogurt, fruits, and vegetables.  Avoid high-fat foods because they are more difficult to digest.  Drink enough water and fluids to keep your urine clear or pale yellow.  If you are dehydrated, ask your caregiver for specific rehydration instructions. Signs of dehydration may include:  Severe thirst.  Dry lips and mouth.  Dizziness.  Dark urine.  Decreasing urine frequency and amount.  Confusion.  Rapid breathing or pulse. SEEK IMMEDIATE MEDICAL CARE IF:   You have blood or brown flecks (like coffee grounds) in your vomit.  You have black or bloody stools.  You have a severe headache or stiff neck.  You are confused.  You have severe abdominal pain.  You have chest pain or trouble breathing.  You do not urinate at least once every 8 hours.  You develop cold or clammy skin.  You continue to vomit for longer than 24 to 48 hours.  You have a fever. MAKE SURE YOU:   Understand these instructions.  Will watch your condition.  Will get help right away if you are not doing well or get worse.   This information is not intended to replace advice given to you by your health care provider. Make sure  you discuss any questions you have with your health care provider.   Document Released: 08/25/2005 Document Revised: 11/17/2011 Document Reviewed: 01/22/2011 Elsevier Interactive Patient Education 2016 Elsevier Inc.  Dehydration, Adult Dehydration  is a condition in which you do not have enough fluid or water in your body. It happens when you take in less fluid than you lose. Vital organs such as the kidneys, brain, and heart cannot function without a proper amount of fluids. Any loss of fluids from the body can cause dehydration.  Dehydration can range from mild to severe. This condition should be treated right away to help prevent it from becoming severe. CAUSES  This condition may be caused by:  Vomiting.  Diarrhea.  Excessive sweating, such as when exercising in hot or humid weather.  Not drinking enough fluid during strenuous exercise or during an illness.  Excessive urine output.  Fever.  Certain medicines. RISK FACTORS This condition is more likely to develop in:  People who are taking certain medicines that cause the body to lose excess fluid (diuretics).   People who have a chronic illness, such as diabetes, that may increase urination.  Older adults.   People who live at high altitudes.   People who participate in endurance sports.  SYMPTOMS  Mild Dehydration  Thirst.  Dry lips.  Slightly dry mouth.  Dry, warm skin. Moderate Dehydration  Very dry mouth.   Muscle cramps.   Dark urine and decreased urine production.   Decreased tear production.   Headache.   Light-headedness, especially when you stand up from a sitting position.  Severe Dehydration  Changes in skin.   Cold and clammy skin.   Skin does not spring back quickly when lightly pinched and released.   Changes in body fluids.   Extreme thirst.   No tears.   Not able to sweat when body temperature is high, such as in hot weather.   Minimal urine production.   Changes in vital signs.   Rapid, weak pulse (more than 100 beats per minute when you are sitting still).   Rapid breathing.   Low blood pressure.   Other changes.   Sunken eyes.   Cold hands and feet.   Confusion.  Lethargy and  difficulty being awakened.  Fainting (syncope).   Short-term weight loss.   Unconsciousness. DIAGNOSIS  This condition may be diagnosed based on your symptoms. You may also have tests to determine how severe your dehydration is. These tests may include:   Urine tests.   Blood tests.  TREATMENT  Treatment for this condition depends on the severity. Mild or moderate dehydration can often be treated at home. Treatment should be started right away. Do not wait until dehydration becomes severe. Severe dehydration needs to be treated at the hospital. Treatment for Mild Dehydration  Drinking plenty of water to replace the fluid you have lost.   Replacing minerals in your blood (electrolytes) that you may have lost.  Treatment for Moderate Dehydration  Consuming oral rehydration solution (ORS). Treatment for Severe Dehydration  Receiving fluid through an IV tube.   Receiving electrolyte solution through a feeding tube that is passed through your nose and into your stomach (nasogastric tube or NG tube).  Correcting any abnormalities in electrolytes. HOME CARE INSTRUCTIONS   Drink enough fluid to keep your urine clear or pale yellow.   Drink water or fluid slowly by taking small sips. You can also try sucking on ice cubes.  Have food or beverages that contain electrolytes.  Examples include bananas and sports drinks.  Take over-the-counter and prescription medicines only as told by your health care provider.   Prepare ORS according to the manufacturer's instructions. Take sips of ORS every 5 minutes until your urine returns to normal.  If you have vomiting or diarrhea, continue to try to drink water, ORS, or both.   If you have diarrhea, avoid:   Beverages that contain caffeine.   Fruit juice.   Milk.   Carbonated soft drinks.  Do not take salt tablets. This can lead to the condition of having too much sodium in your body (hypernatremia).  SEEK MEDICAL  CARE IF:  You cannot eat or drink without vomiting.  You have had moderate diarrhea during a period of more than 24 hours.  You have a fever. SEEK IMMEDIATE MEDICAL CARE IF:   You have extreme thirst.  You have severe diarrhea.  You have not urinated in 6-8 hours, or you have urinated only a small amount of very dark urine.  You have shriveled skin.  You are dizzy, confused, or both.   This information is not intended to replace advice given to you by your health care provider. Make sure you discuss any questions you have with your health care provider.   Document Released: 08/25/2005 Document Revised: 05/16/2015 Document Reviewed: 01/10/2015 Elsevier Interactive Patient Education [employer].

## 2015-10-13 NOTE — ED Notes (Signed)
Patient with nausea, vomiting, diarrhea, and lower abdominal pain since Tuesday 1/31. Patient reports burning chest pain today, denies radiation. Patient has been to PMD and received zofran, immodium, and zithromax (for recent cough) for symptoms. Patient reports intermittent vomiting.

## 2015-10-13 NOTE — ED Provider Notes (Signed)
CSN: [id number]     Arrival date & time 10/13/15  2355 History   First MD Initiated Contact with Patient 10/13/15 [phone]     Chief Complaint  Patient presents with  . Emesis     (Consider location/radiation/quality/duration/timing/severity/associated sxs/prior Treatment) HPI Patient presents with 5 days of vomiting and diarrhea. She states that she's had multiple episodes of each but the diarrhea has improved since seeing her doctor on Thursday. She's been taking Imodium and Zofran. Patient she had 3-4 episodes of vomiting yesterday. She has episodic abdominal pain which is resolved at present. No blood in the vomit or diarrhea. No known sick contacts. No fever or chills. Past Medical History  Diagnosis Date  . Back pain   . GERD (gastroesophageal reflux disease)   . MI (myocardial infarction) ([place])   . Hypercholesteremia   . Anemia   . Cirrhosis ([place])   . Aortic sclerosis ([place]) 07/04/2011  . Aortic insufficiency 07/04/2011    most recent 2D Echo 02/24/2012  EF greater than 55%  . Carotid stenosis, bilateral 07/04/2011    most recent 05/2010 carotid doppler  . Presence of stent in right coronary artery 07/04/2011  . HTN (hypertension) 07/04/2011  . Coronary artery disease   . Myocardial infarction ([place]) 1995  . GERD (gastroesophageal reflux disease)   . Non-alcoholic fatty liver disease   . UTI (lower urinary tract infection)     chronic/recurring/on bactrim  . CVA (cerebral infarction)     03/07/2014  . Hiatal hernia   . IBS (irritable bowel syndrome)   . Diabetes mellitus 2005  . Varices, esophageal ([place])   . Stroke ([place]) 03/07/2014    no deficits from stroke  . OSA (obstructive sleep apnea) 07/04/2011    sleep study 2009  AHI 9.91/hr and during REM sleep 35.58/hr  . DDD (degenerative disc disease), lumbar    Past Surgical History  Procedure Laterality Date  . Back surgery  1985  . Tonsillectomy    . Appendectomy    . Colonoscopy    . Eye surgery  08    cataract  surgery of the left eye  . Ercp    . Myringotomy  2012    both ears  . Hardware removal Right 01/17/2013    Procedure: REMOVAL OF HARDWARE AND EXCISION ULNAR STYLOID RIGHT WRIST;  Surgeon: Tennis Must, MD;  Location: [facility];  Service: Orthopedics;  Laterality: Right;  . Wrist surgery      rt wrist hardwear removal  . Esophagogastroduodenoscopy N/A 04/01/2013    Procedure: ESOPHAGOGASTRODUODENOSCOPY (EGD);  Surgeon: Rogene Houston, MD;  [facility];  Service: Endoscopy;  Laterality: N/A;  230-rescheduled to 8:30am Ann notified pt  . Esophageal banding N/A 04/01/2013    Procedure: ESOPHAGEAL BANDING;  Surgeon: Rogene Houston, MD;  [facility];  Service: Endoscopy;  Laterality: N/A;  . Esophagogastroduodenoscopy N/A 05/24/2013    Procedure: ESOPHAGOGASTRODUODENOSCOPY (EGD);  Surgeon: Rogene Houston, MD;  [facility];  Service: Endoscopy;  Laterality: N/A;  730  . Esophageal banding N/A 05/24/2013    Procedure: ESOPHAGEAL BANDING;  Surgeon: Rogene Houston, MD;  [facility];  Service: Endoscopy;  Laterality: N/A;  . Dilation and curettage of uterus      x2  . Vaginal hysterectomy  1972  . Anal fissure repair  1972  . Epi retinal membrane peel Left   . Esophagogastroduodenoscopy N/A 06/21/2014    Procedure: ESOPHAGOGASTRODUODENOSCOPY (EGD);  Surgeon: Bernadene Person  Gloriann Loan, MD;  [facility];  Service: Endoscopy;  Laterality: N/A;  930-rescheduled 10/14 @ 1200 Ann to notify pt  . Esophageal banding N/A 06/21/2014    Procedure: ESOPHAGEAL BANDING;  Surgeon: Rogene Houston, MD;  [facility];  Service: Endoscopy;  Laterality: N/A;  . Cardiac catheterization  1995.,2006,1997    stent to the proximal RCA after MI   . Cataract extraction w/phaco Right 07/04/2014    Procedure: CATARACT EXTRACTION PHACO AND INTRAOCULAR LENS PLACEMENT (IOC);  Surgeon: Elta Guadeloupe T. Gershon Crane, MD;  [facility]: [facility];  Service: Ophthalmology;   Laterality: Right;  CDE:13.13   Family History  Problem Relation Age of Onset  . Diabetes Mother   . Heart failure Father    Social History  Substance Use Topics  . Smoking status: Former Smoker -- 0.25 packs/day for 50 years    Types: Cigarettes    Quit date: 01/13/2012  . Smokeless tobacco: Never Used  . Alcohol Use: No   OB History    No data available     Review of Systems  Constitutional: Negative for fever and chills.  Respiratory: Negative for shortness of breath.   Cardiovascular: Negative for chest pain.  Gastrointestinal: Positive for nausea, vomiting, abdominal pain and diarrhea. Negative for blood in stool and abdominal distention.  Genitourinary: Negative for dysuria, frequency, hematuria and flank pain.  Musculoskeletal: Negative for back pain, neck pain and neck stiffness.  Skin: Negative for rash and wound.  Neurological: Negative for dizziness, weakness, light-headedness, numbness and headaches.  All other systems reviewed and are negative.     Allergies  Tape  Home Medications   Prior to Admission medications   Medication Sig Start Date End Date Taking? Authorizing Provider  aspirin EC 81 MG tablet Take 81 mg by mouth daily.   Yes Historical Provider, MD  atorvastatin (LIPITOR) 40 MG tablet Take 40 mg by mouth daily.   Yes Historical Provider, MD  azithromycin (ZITHROMAX) 250 MG tablet Take 1 tablet by mouth daily. 10/11/15  Yes Historical Provider, MD  benzonatate (TESSALON) 200 MG capsule Take 200 mg by mouth 3 (three) times daily as needed for cough.   Yes Historical Provider, MD  carvedilol (COREG) 12.5 MG tablet Take 12.5 mg by mouth 2 (two) times daily with a meal.  06/21/15  Yes Historical Provider, MD  doxylamine, Sleep, (UNISOM) 25 MG tablet Take 25 mg by mouth at bedtime as needed.   Yes Historical Provider, MD  HUMULIN N KWIKPEN 100 UNIT/ML Kiwkpen Inject 50 Units into the skin 2 (two) times daily.  07/11/14  Yes Historical Provider, MD   insulin glargine (LANTUS SOLOSTAR) 100 UNIT/ML injection Inject 50 Units into the skin at bedtime.    Yes Historical Provider, MD  isosorbide mononitrate (IMDUR) 30 MG 24 hr tablet Take 30 mg by mouth daily.   Yes Historical Provider, MD  loperamide (IMODIUM A-D) 2 MG capsule Take 4 mg by mouth as needed for diarrhea or loose stools.   Yes Historical Provider, MD  omeprazole (PRILOSEC OTC) 20 MG tablet Take 20 mg by mouth daily.   Yes Historical Provider, MD  ondansetron (ZOFRAN) 4 MG tablet Take 1 tablet (4 mg total) by mouth every 8 (eight) hours as needed for nausea or vomiting. 07/09/15  Yes Francine Graven, DO  ondansetron (ZOFRAN-ODT) 4 MG disintegrating tablet Take 1 tablet by mouth daily. 10/11/15  Yes Historical Provider, MD  sulfamethoxazole-trimethoprim (BACTRIM DS,SEPTRA DS) 800-160 MG per tablet Take 1 tablet by  mouth 2 (two) times daily. For 10 days   Yes Historical Provider, MD  promethazine (PHENERGAN) 12.5 MG tablet Take 1 tablet (12.5 mg total) by mouth every 6 (six) hours as needed for nausea or vomiting. 10/13/15   Julianne Rice, MD  zolpidem (AMBIEN) 10 MG tablet Take 1 tablet (10 mg total) by mouth at bedtime. Patient not taking: Reported on 07/09/2015 04/19/15   Butch Penny, NP   BP 135/45 mmHg  Pulse 73  Temp(Src) 98 F (36.7 C) (Oral)  Resp 14  Ht 5' 3"  (1.6 m)  Wt 152 lb (68.947 kg)  BMI 26.93 kg/m2  SpO2 95% Physical Exam  Constitutional: She is oriented to person, place, and time. She appears well-developed and well-nourished. No distress.  HENT:  Head: Normocephalic and atraumatic.  Mouth/Throat: Oropharynx is clear and moist. No oropharyngeal exudate.  Eyes: EOM are normal. Pupils are equal, round, and reactive to light.  Neck: Normal range of motion. Neck supple.  Cardiovascular: Normal rate and regular rhythm.   Pulmonary/Chest: Effort normal and breath sounds normal. No respiratory distress. She has no wheezes. She has no rales. She exhibits no  tenderness.  Abdominal: Soft. Bowel sounds are normal. She exhibits no distension and no mass. There is no tenderness. There is no rebound and no guarding.  Musculoskeletal: Normal range of motion. She exhibits no edema or tenderness.  Neurological: She is alert and oriented to person, place, and time.  Skin: Skin is warm and dry. No rash noted. No erythema.  Psychiatric: She has a normal mood and affect. Her behavior is normal.  Nursing note and vitals reviewed.   ED Course  Procedures (including critical care time) Labs Review Labs Reviewed  CBC WITH DIFFERENTIAL/PLATELET - Abnormal; Notable for the following:    Platelets 123 (*)    All other components within normal limits  COMPREHENSIVE METABOLIC PANEL - Abnormal; Notable for the following:    Glucose, Bld 136 (*)    BUN 28 (*)    Creatinine, Ser 1.20 (*)    GFR calc non Af Amer 44 (*)    GFR calc Af Amer 51 (*)    All other components within normal limits  URINALYSIS, ROUTINE W REFLEX MICROSCOPIC (NOT AT [facility]) - Abnormal; Notable for the following:    Specific Gravity, Urine >1.030 (*)    All other components within normal limits  URINE CULTURE    Imaging Review No results found. I have personally reviewed and evaluated these images and lab results as part of my medical decision-making.   EKG Interpretation   Date/Time:  Saturday October 13 2015 07:40:26 EST Ventricular Rate:  79 PR Interval:  214 QRS Duration: 95 QT Interval:  407 QTC Calculation: 467 R Axis:   -13 Text Interpretation:  Sinus or ectopic atrial rhythm Borderline prolonged  PR interval Anteroseptal infarct, old Confirmed by Shanise Balch  MD, Sully Dyment  ([id number]) on 10/13/2015 8:12:13 AM      MDM   Final diagnoses:  Non-intractable vomiting with nausea, vomiting of unspecified type  Dehydration    Patient is in no distress. She has a benign abdominal exam.  Patient is feeling much better after IV fluids. Responded well to Phenergan. No further  vomiting. She is ambulatory without difficulty. Advised to follow-up with primary physician and return precautions given.  Julianne Rice, MD 10/13/15 1150

## 2015-10-14 ENCOUNTER — Encounter (HOSPITAL_COMMUNITY): Payer: Self-pay

## 2015-10-14 ENCOUNTER — Observation Stay (HOSPITAL_COMMUNITY)
Admission: EM | Admit: 2015-10-14 | Discharge: 2015-10-16 | Disposition: A | Discharge status: Home / Self Care | Payer: Medicare Other | Attending: Internal Medicine | Admitting: Internal Medicine

## 2015-10-14 DIAGNOSIS — Z794 Long term (current) use of insulin: Secondary | ICD-10-CM | POA: Insufficient documentation

## 2015-10-14 DIAGNOSIS — E7800 Pure hypercholesterolemia, unspecified: Secondary | ICD-10-CM | POA: Diagnosis not present

## 2015-10-14 DIAGNOSIS — D649 Anemia, unspecified: Secondary | ICD-10-CM | POA: Diagnosis not present

## 2015-10-14 DIAGNOSIS — K589 Irritable bowel syndrome without diarrhea: Secondary | ICD-10-CM | POA: Diagnosis not present

## 2015-10-14 DIAGNOSIS — I252 Old myocardial infarction: Secondary | ICD-10-CM | POA: Insufficient documentation

## 2015-10-14 DIAGNOSIS — K219 Gastro-esophageal reflux disease without esophagitis: Secondary | ICD-10-CM | POA: Diagnosis present

## 2015-10-14 DIAGNOSIS — Z7982 Long term (current) use of aspirin: Secondary | ICD-10-CM | POA: Diagnosis not present

## 2015-10-14 DIAGNOSIS — K449 Diaphragmatic hernia without obstruction or gangrene: Secondary | ICD-10-CM | POA: Insufficient documentation

## 2015-10-14 DIAGNOSIS — G4733 Obstructive sleep apnea (adult) (pediatric): Secondary | ICD-10-CM | POA: Insufficient documentation

## 2015-10-14 DIAGNOSIS — K7460 Unspecified cirrhosis of liver: Secondary | ICD-10-CM | POA: Diagnosis present

## 2015-10-14 DIAGNOSIS — E860 Dehydration: Secondary | ICD-10-CM | POA: Diagnosis not present

## 2015-10-14 DIAGNOSIS — Z951 Presence of aortocoronary bypass graft: Secondary | ICD-10-CM | POA: Insufficient documentation

## 2015-10-14 DIAGNOSIS — I351 Nonrheumatic aortic (valve) insufficiency: Secondary | ICD-10-CM | POA: Diagnosis present

## 2015-10-14 DIAGNOSIS — Z8673 Personal history of transient ischemic attack (TIA), and cerebral infarction without residual deficits: Secondary | ICD-10-CM | POA: Diagnosis not present

## 2015-10-14 DIAGNOSIS — Z87891 Personal history of nicotine dependence: Secondary | ICD-10-CM | POA: Insufficient documentation

## 2015-10-14 DIAGNOSIS — Z79899 Other long term (current) drug therapy: Secondary | ICD-10-CM | POA: Diagnosis not present

## 2015-10-14 DIAGNOSIS — Z792 Long term (current) use of antibiotics: Secondary | ICD-10-CM | POA: Insufficient documentation

## 2015-10-14 DIAGNOSIS — R112 Nausea with vomiting, unspecified: Principal | ICD-10-CM | POA: Diagnosis present

## 2015-10-14 DIAGNOSIS — I700 Atherosclerosis of aorta: Secondary | ICD-10-CM | POA: Insufficient documentation

## 2015-10-14 DIAGNOSIS — I10 Essential (primary) hypertension: Secondary | ICD-10-CM | POA: Diagnosis present

## 2015-10-14 DIAGNOSIS — E119 Type 2 diabetes mellitus without complications: Secondary | ICD-10-CM | POA: Diagnosis not present

## 2015-10-14 DIAGNOSIS — R1110 Vomiting, unspecified: Secondary | ICD-10-CM

## 2015-10-14 LAB — CBC WITH DIFFERENTIAL/PLATELET
Basophils Absolute: 0 10*3/uL (ref 0.0–0.1)
Basophils Relative: 0 %
EOS PCT: 2 %
Eosinophils Absolute: 0.3 10*3/uL (ref 0.0–0.7)
HEMATOCRIT: 37.8 % (ref 36.0–46.0)
Hemoglobin: 13 g/dL (ref 12.0–15.0)
Lymphocytes Relative: 13 %
Lymphs Abs: 1.5 10*3/uL (ref 0.7–4.0)
MCH: 31.6 pg (ref 26.0–34.0)
MCHC: 34.4 g/dL (ref 30.0–36.0)
MCV: 91.7 fL (ref 78.0–100.0)
MONO ABS: 0.8 10*3/uL (ref 0.1–1.0)
MONOS PCT: 7 %
NEUTROS ABS: 8.7 10*3/uL — AB (ref 1.7–7.7)
Neutrophils Relative %: 78 %
PLATELETS: 158 10*3/uL (ref 150–400)
RBC: 4.12 MIL/uL (ref 3.87–5.11)
RDW: 12.7 % (ref 11.5–15.5)
WBC: 11.3 10*3/uL — ABNORMAL HIGH (ref 4.0–10.5)

## 2015-10-14 MED ORDER — ONDANSETRON HCL 4 MG/2ML IJ SOLN
4.0000 mg | Freq: Once | INTRAMUSCULAR | Status: AC
  Administered 2015-10-14: 4 mg via INTRAVENOUS
  Filled 2015-10-14: qty 2

## 2015-10-14 MED ORDER — SODIUM CHLORIDE 0.9 % IV BOLUS (SEPSIS)
1000.0000 mL | Freq: Once | INTRAVENOUS | Status: AC
  Administered 2015-10-14: 1000 mL via INTRAVENOUS

## 2015-10-14 NOTE — ED Provider Notes (Signed)
CSN: [id number]     Arrival date & time 10/14/15  2241 History  By signing my name below, I, Eustaquio Maize, attest that this documentation has been prepared under the direction and in the presence of Rolland Porter, MD at 2330. Electronically Signed: Eustaquio Maize, ED Scribe. 10/15/2015. 12:08 AM.   Chief Complaint  Patient presents with  . Emesis   The history is provided by the patient. No language interpreter was used.     HPI Comments: [name] is a 75 y.o. female who presents to the Emergency Department complaining of gradual onset, constant, nausea and vomiting x 6 days. Pt states that she began vomiting 6 days ago while at her husband's funeral. She reports her husband had dementia and had been in a nursing home for a while. Patient reports 5-10 episodes of vomiting per day with 5 episodes today. She also mentions having diarrhea 1 day ago but took Imodium with relief. Pt does not believe she ate anything to cause her symptoms. No recent sick contact with similar symptoms. She also complains of dizziness, lightheadedness, dry cough, and wheezing. Pt was seen at Dr. Delanna Ahmadi office 10/11/2015 and given a Cortisone shot, Z pack, and Zofran. Pt was also seen in the ED on 10/13/2015 for vomiting and diarrhea. She was given IV fluids and Phenergan with relief and was discahrged home. Denies abdominal pain, fever, shortness of breath, or any other associated symptoms. Pt is former smoker who quit a couple of years ago.   PCP - Dr. Hilma Favors  Past Medical History  Diagnosis Date  . Back pain   . GERD (gastroesophageal reflux disease)   . MI (myocardial infarction) ([place])   . Hypercholesteremia   . Anemia   . Cirrhosis ([place])   . Aortic sclerosis ([place]) 07/04/2011  . Aortic insufficiency 07/04/2011    most recent 2D Echo 02/24/2012  EF greater than 55%  . Carotid stenosis, bilateral 07/04/2011    most recent 05/2010 carotid doppler  . Presence of stent in right coronary artery 07/04/2011  .  HTN (hypertension) 07/04/2011  . Coronary artery disease   . Myocardial infarction ([place]) 1995  . GERD (gastroesophageal reflux disease)   . Non-alcoholic fatty liver disease   . UTI (lower urinary tract infection)     chronic/recurring/on bactrim  . CVA (cerebral infarction)     03/07/2014  . Hiatal hernia   . IBS (irritable bowel syndrome)   . Diabetes mellitus 2005  . Varices, esophageal ([place])   . Stroke ([place]) 03/07/2014    no deficits from stroke  . OSA (obstructive sleep apnea) 07/04/2011    sleep study 2009  AHI 9.91/hr and during REM sleep 35.58/hr  . DDD (degenerative disc disease), lumbar    Past Surgical History  Procedure Laterality Date  . Back surgery  1985  . Tonsillectomy    . Appendectomy    . Colonoscopy    . Eye surgery  08    cataract surgery of the left eye  . Ercp    . Myringotomy  2012    both ears  . Hardware removal Right 01/17/2013    Procedure: REMOVAL OF HARDWARE AND EXCISION ULNAR STYLOID RIGHT WRIST;  Surgeon: Tennis Must, MD;  Location: [facility];  Service: Orthopedics;  Laterality: Right;  . Wrist surgery      rt wrist hardwear removal  . Esophagogastroduodenoscopy N/A 04/01/2013    Procedure: ESOPHAGOGASTRODUODENOSCOPY (EGD);  Surgeon: Rogene Houston, MD;  [facility];  Service: Endoscopy;  Laterality: N/A;  230-rescheduled to 8:30am Ann notified pt  . Esophageal banding N/A 04/01/2013    Procedure: ESOPHAGEAL BANDING;  Surgeon: Rogene Houston, MD;  [facility];  Service: Endoscopy;  Laterality: N/A;  . Esophagogastroduodenoscopy N/A 05/24/2013    Procedure: ESOPHAGOGASTRODUODENOSCOPY (EGD);  Surgeon: Rogene Houston, MD;  [facility];  Service: Endoscopy;  Laterality: N/A;  730  . Esophageal banding N/A 05/24/2013    Procedure: ESOPHAGEAL BANDING;  Surgeon: Rogene Houston, MD;  [facility];  Service: Endoscopy;  Laterality: N/A;  . Dilation and curettage of uterus      x2  .  Vaginal hysterectomy  1972  . Anal fissure repair  1972  . Epi retinal membrane peel Left   . Esophagogastroduodenoscopy N/A 06/21/2014    Procedure: ESOPHAGOGASTRODUODENOSCOPY (EGD);  Surgeon: Rogene Houston, MD;  [facility];  Service: Endoscopy;  Laterality: N/A;  930-rescheduled 10/14 @ 1200 Ann to notify pt  . Esophageal banding N/A 06/21/2014    Procedure: ESOPHAGEAL BANDING;  Surgeon: Rogene Houston, MD;  [facility];  Service: Endoscopy;  Laterality: N/A;  . Cardiac catheterization  1995.,2006,1997    stent to the proximal RCA after MI   . Cataract extraction w/phaco Right 07/04/2014    Procedure: CATARACT EXTRACTION PHACO AND INTRAOCULAR LENS PLACEMENT (IOC);  Surgeon: Elta Guadeloupe T. Gershon Crane, MD;  [facility];  Service: Ophthalmology;  Laterality: Right;  CDE:13.13   Family History  Problem Relation Age of Onset  . Diabetes Mother   . Heart failure Father    Social History  Substance Use Topics  . Smoking status: Former Smoker -- 0.25 packs/day for 50 years    Types: Cigarettes    Quit date: 01/13/2012  . Smokeless tobacco: Never Used  . Alcohol Use: No   Lives alone  OB History    No data available     Review of Systems  Constitutional: Negative for fever.  Respiratory: Negative for shortness of breath.   Gastrointestinal: Positive for nausea, vomiting and diarrhea. Negative for abdominal pain.  All other systems reviewed and are negative.     Allergies  Tape  Home Medications   Prior to Admission medications   Medication Sig Start Date End Date Taking? Authorizing Provider  aspirin EC 81 MG tablet Take 81 mg by mouth daily.   Yes Historical Provider, MD  atorvastatin (LIPITOR) 40 MG tablet Take 40 mg by mouth daily.   Yes Historical Provider, MD  azithromycin (ZITHROMAX) 250 MG tablet Take 1 tablet by mouth daily. 10/11/15  Yes Historical Provider, MD  carvedilol (COREG) 12.5 MG tablet Take 12.5 mg by mouth 2 (two) times daily with a  meal.  06/21/15  Yes Historical Provider, MD  doxylamine, Sleep, (UNISOM) 25 MG tablet Take 25 mg by mouth at bedtime as needed for sleep or rhinitis.    Yes Historical Provider, MD  HUMULIN N KWIKPEN 100 UNIT/ML Kiwkpen Inject 50 Units into the skin 2 (two) times daily.  07/11/14  Yes Historical Provider, MD  insulin glargine (LANTUS SOLOSTAR) 100 UNIT/ML injection Inject 50 Units into the skin at bedtime.    Yes Historical Provider, MD  isosorbide mononitrate (IMDUR) 30 MG 24 hr tablet Take 30 mg by mouth daily.   Yes Historical Provider, MD  loperamide (IMODIUM A-D) 2 MG capsule Take 4 mg by mouth as needed for diarrhea or loose stools.   Yes Historical Provider, MD  omeprazole (PRILOSEC OTC) 20 MG tablet Take 20 mg by mouth daily.   Yes Historical Provider, MD  ondansetron (ZOFRAN) 4 MG tablet Take 1 tablet (4 mg total) by mouth every 8 (eight) hours as needed for nausea or vomiting. 07/09/15  Yes Francine Graven, DO  promethazine (PHENERGAN) 12.5 MG tablet Take 1 tablet (12.5 mg total) by mouth every 6 (six) hours as needed for nausea or vomiting. 10/13/15  Yes Julianne Rice, MD  sulfamethoxazole-trimethoprim (BACTRIM DS,SEPTRA DS) 800-160 MG per tablet Take 1 tablet by mouth 2 (two) times daily. For 10 days   Yes Historical Provider, MD  zolpidem (AMBIEN) 10 MG tablet Take 1 tablet (10 mg total) by mouth at bedtime. Patient not taking: Reported on 07/09/2015 04/19/15   Butch Penny, NP   BP 187/67 mmHg  Pulse 86  Temp(Src) 98.7 F (37.1 C) (Oral)  Resp 14  Ht 5' 3"  (1.6 m)  Wt 150 lb (68.04 kg)  BMI 26.58 kg/m2  SpO2 97%  Vital signs normal except for hypertension    Physical Exam  Constitutional: She is oriented to person, place, and time. She appears well-developed and well-nourished.  Non-toxic appearance. She does not appear ill. No distress.  Dry heaving  HENT:  Head: Normocephalic and atraumatic.  Right Ear: External ear normal.  Left Ear: External ear normal.  Nose:  Nose normal. No mucosal edema or rhinorrhea.  Mouth/Throat: Oropharynx is clear and moist. Mucous membranes are dry. No dental abscesses or uvula swelling.  Eyes: Conjunctivae and EOM are normal. Pupils are equal, round, and reactive to light.  Neck: Normal range of motion and full passive range of motion without pain. Neck supple.  Cardiovascular: Normal rate, regular rhythm and normal heart sounds.  Exam reveals no gallop and no friction rub.   No murmur heard. Pulmonary/Chest: Effort normal and breath sounds normal. No respiratory distress. She has no wheezes. She has no rhonchi. She has no rales. She exhibits no tenderness and no crepitus.  Abdominal: Soft. Normal appearance and bowel sounds are normal. She exhibits no distension. There is no tenderness. There is no rebound and no guarding.  Musculoskeletal: Normal range of motion. She exhibits no edema or tenderness.  Moves all extremities well.   Neurological: She is alert and oriented to person, place, and time. She has normal strength. No cranial nerve deficit.  Skin: Skin is warm, dry and intact. No rash noted. No erythema. There is pallor.  Psychiatric: She has a normal mood and affect. Her speech is normal and behavior is normal. Her mood appears not anxious.  Nursing note and vitals reviewed.   ED Course  Procedures (including critical care time)  Medications  sodium chloride 0.9 % bolus 1,000 mL (0 mLs Intravenous Stopped 10/15/15 0050)  ondansetron (ZOFRAN) injection 4 mg (4 mg Intravenous Given 10/14/15 2352)  sodium chloride 0.9 % bolus 1,000 mL (1,000 mLs Intravenous New Bag/Given 10/15/15 0104)  metoCLOPramide (REGLAN) injection 10 mg (10 mg Intravenous Given 10/15/15 0100)  diphenhydrAMINE (BENADRYL) injection 25 mg (25 mg Intravenous Given 10/15/15 0058)  promethazine (PHENERGAN) injection 25 mg (25 mg Intramuscular Given 10/15/15 0148)     DIAGNOSTIC STUDIES: Oxygen Saturation is 97% on RA, normal by my interpretation.     COORDINATION OF CARE: 11:39 PM-Discussed treatment plan which includes CMP, CBC, Lipase, lactic acid,  with pt at bedside and pt agreed to plan. She was given IV fluids and IV zofran.   12:43 AM - Upon reevaluation, pt states she is  still nauseous and vomiting. She again denies any abdominal pain. Discussed UA, CMP, CBC, and Lipase with pt. Will order Reglan and Benadryl. She was given another liter of NS, she reports no urine output since she received the first liter.   Recheck at 1:40 AM patient states her nausea is not improved. Again she denies abdominal pain. She was given Phenergan IM. Patient is agreeable to being admitted for intractable vomiting. I suspect some of her nausea may be related to her grief from her husband's death.  01:50 Dr Marin Comment, will see patient and admit, he will do bed request  Labs Review Results for orders placed or performed during the hospital encounter of 10/14/15  Comprehensive metabolic panel  Result Value Ref Range   Sodium 136 135 - 145 mmol/L   Potassium 3.5 3.5 - 5.1 mmol/L   Chloride 103 101 - 111 mmol/L   CO2 22 22 - 32 mmol/L   Glucose, Bld 132 (H) 65 - 99 mg/dL   BUN 16 6 - 20 mg/dL   Creatinine, Ser 0.89 0.44 - 1.00 mg/dL   Calcium 8.7 (L) 8.9 - 10.3 mg/dL   Total Protein 6.8 6.5 - 8.1 g/dL   Albumin 3.8 3.5 - 5.0 g/dL   AST 21 15 - 41 U/L   ALT 18 14 - 54 U/L   Alkaline Phosphatase 90 38 - 126 U/L   Total Bilirubin 0.8 0.3 - 1.2 mg/dL   GFR calc non Af Amer >60 >60 mL/min   GFR calc Af Amer >60 >60 mL/min   Anion gap 11 5 - 15  CBC with Differential  Result Value Ref Range   WBC 11.3 (H) 4.0 - 10.5 K/uL   RBC 4.12 3.87 - 5.11 MIL/uL   Hemoglobin 13.0 12.0 - 15.0 g/dL   HCT 37.8 36.0 - 46.0 %   MCV 91.7 78.0 - 100.0 fL   MCH 31.6 26.0 - 34.0 pg   MCHC 34.4 30.0 - 36.0 g/dL   RDW 12.7 11.5 - 15.5 %   Platelets 158 150 - 400 K/uL   Neutrophils Relative % 78 %   Neutro Abs 8.7 (H) 1.7 - 7.7 K/uL   Lymphocytes Relative 13 %   Lymphs  Abs 1.5 0.7 - 4.0 K/uL   Monocytes Relative 7 %   Monocytes Absolute 0.8 0.1 - 1.0 K/uL   Eosinophils Relative 2 %   Eosinophils Absolute 0.3 0.0 - 0.7 K/uL   Basophils Relative 0 %   Basophils Absolute 0.0 0.0 - 0.1 K/uL  Lipase, blood  Result Value Ref Range   Lipase 26 11 - 51 U/L  Lactic acid, plasma  Result Value Ref Range   Lactic Acid, Venous 1.1 0.5 - 2.0 mmol/L  Urinalysis, Routine w reflex microscopic  Result Value Ref Range   Color, Urine YELLOW YELLOW   APPearance CLEAR CLEAR   Specific Gravity, Urine >1.030 (H) 1.005 - 1.030   pH 6.0 5.0 - 8.0   Glucose, UA NEGATIVE NEGATIVE mg/dL   Hgb urine dipstick TRACE (A) NEGATIVE   Bilirubin Urine NEGATIVE NEGATIVE   Ketones, ur 15 (A) NEGATIVE mg/dL   Protein, ur NEGATIVE NEGATIVE mg/dL   Nitrite NEGATIVE NEGATIVE   Leukocytes, UA NEGATIVE NEGATIVE  Urine microscopic-add on  Result Value Ref Range   Squamous Epithelial / LPF 6-30 (A) NONE SEEN   WBC, UA 0-5 0 - 5 WBC/hpf   RBC / HPF 0-5 0 - 5 RBC/hpf   Bacteria, UA FEW (A) NONE SEEN  Laboratory interpretation all normal except for concentrated urine with some ketones consistent with dehydration  I have personally reviewed and evaluated these lab results as part of my medical decision-making.    MDM   Final diagnoses:  Intractable vomiting with nausea, vomiting of unspecified type  Dehydration   Plan admission  Rolland Porter, MD, FACEP   I personally performed the services described in this documentation, which was scribed in my presence. The recorded information has been reviewed and considered.  Rolland Porter, MD, Barbette Or, MD 10/15/15 [phone]

## 2015-10-14 NOTE — ED Notes (Signed)
Patient states NV started 4 days ago. Patient was seen in ED 10/13/15 for same-no improvement per patient.

## 2015-10-15 DIAGNOSIS — K219 Gastro-esophageal reflux disease without esophagitis: Secondary | ICD-10-CM

## 2015-10-15 DIAGNOSIS — R1110 Vomiting, unspecified: Secondary | ICD-10-CM

## 2015-10-15 DIAGNOSIS — R112 Nausea with vomiting, unspecified: Secondary | ICD-10-CM | POA: Diagnosis present

## 2015-10-15 DIAGNOSIS — I351 Nonrheumatic aortic (valve) insufficiency: Secondary | ICD-10-CM | POA: Diagnosis not present

## 2015-10-15 LAB — URINALYSIS, ROUTINE W REFLEX MICROSCOPIC
BILIRUBIN URINE: NEGATIVE
GLUCOSE, UA: NEGATIVE mg/dL
KETONES UR: 15 mg/dL — AB
Leukocytes, UA: NEGATIVE
Nitrite: NEGATIVE
PH: 6 (ref 5.0–8.0)
Protein, ur: NEGATIVE mg/dL
Specific Gravity, Urine: >1.03 — ABNORMAL HIGH (ref 1.005–1.030)

## 2015-10-15 LAB — GLUCOSE, CAPILLARY
GLUCOSE-CAPILLARY: 116 mg/dL — AB (ref 65–99)
GLUCOSE-CAPILLARY: 101 mg/dL — AB (ref 65–99)
GLUCOSE-CAPILLARY: 77 mg/dL (ref 65–99)
Glucose-Capillary: 144 mg/dL — ABNORMAL HIGH (ref 65–99)
Glucose-Capillary: 97 mg/dL (ref 65–99)

## 2015-10-15 LAB — LACTIC ACID, PLASMA: Lactic Acid, Venous: 1.1 mmol/L (ref 0.5–2.0)

## 2015-10-15 LAB — GASTROINTESTINAL PANEL BY PCR, STOOL (REPLACES STOOL CULTURE)
ASTROVIRUS: NOT DETECTED
Adenovirus F40/41: NOT DETECTED
CRYPTOSPORIDIUM: NOT DETECTED
CYCLOSPORA CAYETANENSIS: NOT DETECTED
Campylobacter species: NOT DETECTED
E. COLI O157: NOT DETECTED
ENTEROAGGREGATIVE E COLI (EAEC): NOT DETECTED
ENTEROPATHOGENIC E COLI (EPEC): NOT DETECTED
Entamoeba histolytica: NOT DETECTED
Enterotoxigenic E coli (ETEC): NOT DETECTED
Giardia lamblia: NOT DETECTED
Norovirus GI/GII: NOT DETECTED
Plesimonas shigelloides: NOT DETECTED
ROTAVIRUS A: NOT DETECTED
SAPOVIRUS (I, II, IV, AND V): NOT DETECTED
SHIGA LIKE TOXIN PRODUCING E COLI (STEC): NOT DETECTED
Salmonella species: NOT DETECTED
Shigella/Enteroinvasive E coli (EIEC): NOT DETECTED
VIBRIO SPECIES: NOT DETECTED
Vibrio cholerae: NOT DETECTED
Yersinia enterocolitica: NOT DETECTED

## 2015-10-15 LAB — C DIFFICILE QUICK SCREEN W PCR REFLEX
C DIFFICILE (CDIFF) TOXIN: NEGATIVE
C DIFFICLE (CDIFF) ANTIGEN: NEGATIVE
C Diff interpretation: NEGATIVE

## 2015-10-15 LAB — COMPREHENSIVE METABOLIC PANEL
ALT: 18 U/L (ref 14–54)
AST: 21 U/L (ref 15–41)
Albumin: 3.8 g/dL (ref 3.5–5.0)
Alkaline Phosphatase: 90 U/L (ref 38–126)
Anion gap: 11 (ref 5–15)
BUN: 16 mg/dL (ref 6–20)
CO2: 22 mmol/L (ref 22–32)
Calcium: 8.7 mg/dL — ABNORMAL LOW (ref 8.9–10.3)
Chloride: 103 mmol/L (ref 101–111)
Creatinine, Ser: 0.89 mg/dL (ref 0.44–1.00)
GFR calc Af Amer: >60 mL/min (ref >60)
GFR calc non Af Amer: >60 mL/min (ref >60)
Glucose, Bld: 132 mg/dL — ABNORMAL HIGH (ref 65–99)
Potassium: 3.5 mmol/L (ref 3.5–5.1)
Sodium: 136 mmol/L (ref 135–145)
Total Bilirubin: 0.8 mg/dL (ref 0.3–1.2)
Total Protein: 6.8 g/dL (ref 6.5–8.1)

## 2015-10-15 LAB — URINE CULTURE

## 2015-10-15 LAB — CREATININE, SERUM
CREATININE: 0.8 mg/dL (ref 0.44–1.00)
GFR calc Af Amer: >60 mL/min (ref >60)
GFR calc non Af Amer: >60 mL/min (ref >60)

## 2015-10-15 LAB — URINE MICROSCOPIC-ADD ON

## 2015-10-15 LAB — CBC
HCT: 37 % (ref 36.0–46.0)
HEMOGLOBIN: 12.7 g/dL (ref 12.0–15.0)
MCH: 31.6 pg (ref 26.0–34.0)
MCHC: 34.3 g/dL (ref 30.0–36.0)
MCV: 92 fL (ref 78.0–100.0)
Platelets: 148 10*3/uL — ABNORMAL LOW (ref 150–400)
RBC: 4.02 MIL/uL (ref 3.87–5.11)
RDW: 12.7 % (ref 11.5–15.5)
WBC: 9.8 10*3/uL (ref 4.0–10.5)

## 2015-10-15 LAB — LIPASE, BLOOD: LIPASE: 26 U/L (ref 11–51)

## 2015-10-15 LAB — TSH: TSH: 0.782 u[IU]/mL (ref 0.350–4.500)

## 2015-10-15 MED ORDER — OMEPRAZOLE MAGNESIUM 20 MG PO TBEC: 20.0000 mg | DELAYED_RELEASE_TABLET | Freq: Every day | ORAL | Status: DC

## 2015-10-15 MED ORDER — ENOXAPARIN SODIUM 40 MG/0.4ML ~~LOC~~ SOLN
40.0000 mg | SUBCUTANEOUS | Status: DC
Start: 2015-10-15 — End: 2015-10-16
  Administered 2015-10-15: 40 mg via SUBCUTANEOUS
  Filled 2015-10-15: qty 0.4

## 2015-10-15 MED ORDER — PANTOPRAZOLE SODIUM 40 MG PO TBEC
40.0000 mg | DELAYED_RELEASE_TABLET | Freq: Every day | ORAL | Status: DC
  Administered 2015-10-15 – 2015-10-16 (×2): 40 mg via ORAL
  Filled 2015-10-15 (×2): qty 1

## 2015-10-15 MED ORDER — PROMETHAZINE HCL 25 MG/ML IJ SOLN
25.0000 mg | Freq: Once | INTRAMUSCULAR | Status: AC
  Administered 2015-10-15: 25 mg via INTRAMUSCULAR
  Filled 2015-10-15: qty 1

## 2015-10-15 MED ORDER — CARVEDILOL 12.5 MG PO TABS
12.5000 mg | ORAL_TABLET | Freq: Two times a day (BID) | ORAL | Status: DC
  Administered 2015-10-15 – 2015-10-16 (×3): 12.5 mg via ORAL
  Filled 2015-10-15 (×3): qty 1

## 2015-10-15 MED ORDER — SODIUM CHLORIDE 0.9 % IV BOLUS (SEPSIS)
1000.0000 mL | Freq: Once | INTRAVENOUS | Status: AC
  Administered 2015-10-15: 1000 mL via INTRAVENOUS

## 2015-10-15 MED ORDER — ATORVASTATIN CALCIUM 40 MG PO TABS
40.0000 mg | ORAL_TABLET | Freq: Every day | ORAL | Status: DC
  Administered 2015-10-15 – 2015-10-16 (×2): 40 mg via ORAL
  Filled 2015-10-15 (×2): qty 1

## 2015-10-15 MED ORDER — SODIUM CHLORIDE 0.9 % IV SOLN
INTRAVENOUS | Status: DC
  Administered 2015-10-15: 11:00:00 via INTRAVENOUS

## 2015-10-15 MED ORDER — ZOLPIDEM TARTRATE 5 MG PO TABS
5.0000 mg | ORAL_TABLET | Freq: Every evening | ORAL | Status: DC | PRN
  Administered 2015-10-15: 5 mg via ORAL
  Filled 2015-10-15: qty 1

## 2015-10-15 MED ORDER — INSULIN GLARGINE 100 UNIT/ML ~~LOC~~ SOLN
25.0000 [IU] | Freq: Every day | SUBCUTANEOUS | Status: DC
  Administered 2015-10-15: 25 [IU] via SUBCUTANEOUS
  Filled 2015-10-15 (×2): qty 0.25

## 2015-10-15 MED ORDER — KCL IN DEXTROSE-NACL 20-5-0.9 MEQ/L-%-% IV SOLN
INTRAVENOUS | Status: DC
Start: 2015-10-15 — End: 2015-10-15
  Administered 2015-10-15: 06:00:00 via INTRAVENOUS

## 2015-10-15 MED ORDER — PANTOPRAZOLE SODIUM 20 MG PO TBEC
20.0000 mg | DELAYED_RELEASE_TABLET | Freq: Every day | ORAL | Status: DC
  Filled 2015-10-15: qty 1

## 2015-10-15 MED ORDER — ONDANSETRON HCL 4 MG PO TABS: 4.0000 mg | ORAL_TABLET | Freq: Four times a day (QID) | ORAL | Status: DC | PRN

## 2015-10-15 MED ORDER — DIPHENOXYLATE-ATROPINE 2.5-0.025 MG PO TABS
1.0000 | ORAL_TABLET | Freq: Four times a day (QID) | ORAL | Status: DC | PRN
  Administered 2015-10-15 (×2): 1 via ORAL
  Filled 2015-10-15 (×2): qty 1

## 2015-10-15 MED ORDER — INSULIN ASPART 100 UNIT/ML ~~LOC~~ SOLN
0.0000 [IU] | SUBCUTANEOUS | Status: DC
  Administered 2015-10-15: 2 [IU] via SUBCUTANEOUS

## 2015-10-15 MED ORDER — ISOSORBIDE MONONITRATE ER 60 MG PO TB24
30.0000 mg | ORAL_TABLET | Freq: Every day | ORAL | Status: DC
  Administered 2015-10-16: 30 mg via ORAL
  Filled 2015-10-15 (×2): qty 1

## 2015-10-15 MED ORDER — DIPHENHYDRAMINE HCL 50 MG/ML IJ SOLN
25.0000 mg | Freq: Once | INTRAMUSCULAR | Status: AC
  Administered 2015-10-15: 25 mg via INTRAVENOUS
  Filled 2015-10-15: qty 1

## 2015-10-15 MED ORDER — ASPIRIN EC 81 MG PO TBEC
81.0000 mg | DELAYED_RELEASE_TABLET | Freq: Every day | ORAL | Status: DC
  Administered 2015-10-15 – 2015-10-16 (×2): 81 mg via ORAL
  Filled 2015-10-15 (×2): qty 1

## 2015-10-15 MED ORDER — ONDANSETRON HCL 4 MG/2ML IJ SOLN
4.0000 mg | Freq: Four times a day (QID) | INTRAMUSCULAR | Status: DC | PRN
  Administered 2015-10-15 (×2): 4 mg via INTRAVENOUS
  Filled 2015-10-15 (×2): qty 2

## 2015-10-15 MED ORDER — TRAZODONE HCL 50 MG PO TABS
50.0000 mg | ORAL_TABLET | Freq: Every evening | ORAL | Status: DC | PRN
  Filled 2015-10-15: qty 1

## 2015-10-15 MED ORDER — METOCLOPRAMIDE HCL 5 MG/ML IJ SOLN
10.0000 mg | Freq: Once | INTRAMUSCULAR | Status: AC
  Administered 2015-10-15: 10 mg via INTRAVENOUS
  Filled 2015-10-15: qty 2

## 2015-10-15 NOTE — Progress Notes (Signed)
[facility] PROGRESS NOTE  Mima Cranmore Schildt ZOX:[id number] DOB: November 10, 1940 DOA: 10/14/2015 PCP: Rocky Morel, MD  Assessment/Plan: 1. Suspected viral syndrome/gastroenteritis -This is what is a pleasant 75 year old female presenting with a one-week history of nausea, vomiting associated with diarrhea. She reports diarrhea is nonbloody, and she denies melanotic stools, recent travel, sick contacts, eating undercooked meats. -She does report recently been diagnosed with upper respiratory tract infection for which she was prescribed a Z-Pak. -Recent antibiotic use making C. difficile colitis a consideration -Pending stool studies, on enteric precautions.  -Will continue supportive care, IV fluid hydration, as needed IV antiemetic therapy  2.  Diabetes mellitus. -Continue insulin 25 units subcutaneous daily at bedtime along with sliding scale coverage. This morning she reports resolution to nausea and vomiting as her diet has been advanced.  3.  Hypertension. -Blood pressure stable, continue carvedilol 12.5 mg by mouth twice a day and isosorbide mononitrate 30 mg by mouth daily.  4.  Coronary artery disease -She does not appear to have acute cardiopulmonary issues, continue antiplatelet therapy with aspirin, beta blocker and statin   Code Status: Full code Family Communication:  Disposition Plan: Continue IV fluid hydration, pending stool studies, anticipate discharge in the next 24 hours if she shows improvement   HPI/Subjective: Mrs. Wimberley is a pleasant 75 year old female with history of hypertension coronary artery disease diabetes who presented with complaints of nausea, vomiting and diarrhea over the past week. She denies recent travels, sick contacts, abdominal pain, fevers, chills. She does report recently taking a deep for upper respiratory tract infection. She appeared dehydrated on physical examination and was admitted overnight and started on IV  fluids.  Objective: Filed Vitals:   10/15/15 0434 10/15/15 0848  BP: 161/53 135/46  Pulse: 81 97  Temp: 98.6 F (37 C)   Resp: 18     Intake/Output Summary (Last 24 hours) at 10/15/15 1013 Last data filed at 10/15/15 0203  Gross per 24 hour  Intake   2000 ml  Output      0 ml  Net   2000 ml   Filed Weights   10/14/15 2248 10/15/15 0434  Weight: 68.04 kg (150 lb) 71.305 kg (157 lb 3.2 oz)    Exam:   General:  She is nontoxic appearing, awake and alert oriented  Cardiovascular: Regular rate rhythm normal S1-S2 no murmurs rubs gallops  Respiratory: Normal respiratory effort lungs are clear  Abdomen: Soft nontender nondistended  Musculoskeletal: No edema  Data Reviewed: Basic Metabolic Panel:  Recent Labs Lab 10/13/15 0800 10/14/15 2339 10/15/15 0601  NA 138 136  --   K 3.5 3.5  --   CL 106 103  --   CO2 24 22  --   GLUCOSE 136* 132*  --   BUN 28* 16  --   CREATININE 1.20* 0.89 0.80  CALCIUM 8.9 8.7*  --    Liver Function Tests:  Recent Labs Lab 10/13/15 0800 10/14/15 2339  AST 21 21  ALT 19 18  ALKPHOS 85 90  BILITOT 0.7 0.8  PROT 6.6 6.8  ALBUMIN 3.8 3.8    Recent Labs Lab 10/14/15 2339  LIPASE 26   No results for input(s): AMMONIA in the last 168 hours. CBC:  Recent Labs Lab 10/13/15 0800 10/14/15 2339 10/15/15 0601  WBC 8.4 11.3* 9.8  NEUTROABS 6.4 8.7*  --   HGB 12.7 13.0 12.7  HCT 37.2 37.8 37.0  MCV 92.3 91.7 92.0  PLT 123* 158 148*   Cardiac Enzymes:  No results for input(s): CKTOTAL, CKMB, CKMBINDEX, TROPONINI in the last 168 hours. BNP (last 3 results) No results for input(s): BNP in the last 8760 hours.  ProBNP (last 3 results) No results for input(s): PROBNP in the last 8760 hours.  CBG:  Recent Labs Lab 10/15/15 0525 10/15/15 0759  GLUCAP 116* 144*    Recent Results (from the past 240 hour(s))  Urine culture     Status: None (Preliminary result)   Collection Time: 10/13/15  8:50 AM  Result Value Ref  Range Status   Specimen Description URINE, CLEAN CATCH  Final   Special Requests zithromax, bactrim ds for chronic uti  Final   Culture   Final    NO GROWTH < 24 HOURS Performed at [facility]    Report Status PENDING  Incomplete     Studies: No results found.  Scheduled Meds: . aspirin EC  81 mg Oral Daily  . atorvastatin  40 mg Oral Daily  . carvedilol  12.5 mg Oral BID WC  . enoxaparin (LOVENOX) injection  40 mg Subcutaneous Q24H  . insulin aspart  0-15 Units Subcutaneous 6 times per day  . insulin glargine  25 Units Subcutaneous QHS  . isosorbide mononitrate  30 mg Oral Daily  . pantoprazole  40 mg Oral Daily   Continuous Infusions:   Principal Problem:   Intractable nausea and vomiting Active Problems:   DM (diabetes mellitus) (HCC)   GERD (gastroesophageal reflux disease)   Aortic insufficiency   HTN (hypertension)   Hepatic cirrhosis ([place])    Time spent:     Kelvin Cellar  [facility] Pager [phone]. If 7PM-7AM, please contact night-coverage at [email or web address], password [email or web address] 10/15/2015, 10:13 AM

## 2015-10-15 NOTE — H&P (Signed)
[facility] History and Physical  [name] BZJ:[id number] DOB: 1941-07-25    PCP:   Rocky Morel, MD   Chief Complaint: Vomiting  HPI: [name] is an 75 y.o. female with a hx of GERD, HLD, CAD, HTN, and DM that presents with nausea and vomiting that began 1 week ago while at her husband's funeral. Patient states that she has about 5-10 episodes of emesis per day. She also reports having diarrhea 1 day that resolved with Imodium. She denies any recent sick contacts. She was seen at Dr. Delanna Ahmadi office on 10/11/15 and was given a Cortisone shot, Z pack, and Zofran with no relief. She denies any abdominal pain, fever, chills, SOB, or CP. While in the ED, labs revealed an unremarkable CMP, WBC 11.3, and normal lactic acid. Hospitalist was asked to admit her for intractable nausea and vomiting.  She was also on bactrim for URI as well.  She had diarrhea last week, but her symptoms have improved, and she no longer had diarrhea.   Rewiew of Systems:  Constitutional: Negative for malaise, fever and chills. No significant weight loss or weight gain Eyes: Negative for eye pain, redness and discharge, diplopia, visual changes, or flashes of light. ENMT: Negative for ear pain, hoarseness, nasal congestion, sinus pressure and sore throat. No headaches; tinnitus, drooling, or problem swallowing. Cardiovascular: Negative for chest pain, palpitations, diaphoresis, dyspnea and peripheral edema. ; No orthopnea, PND Respiratory: Negative for cough, hemoptysis, wheezing and stridor. No pleuritic chestpain. Gastrointestinal: Positive for nausea, vomiting, diarrhea.  Negative for constipation, abdominal pain, melena, blood in stool, hematemesis, jaundice and rectal bleeding.    Genitourinary: Negative for frequency, dysuria, incontinence,flank pain and hematuria; Musculoskeletal: Negative for back pain and neck pain. Negative for swelling and trauma.;  Skin: . Negative for pruritus, rash,  abrasions, bruising and skin lesion.; ulcerations Neuro: Negative for headache, lightheadedness and neck stiffness. Negative for weakness, altered level of consciousness , altered mental status, extremity weakness, burning feet, involuntary movement, seizure and syncope.  Psych: negative for anxiety, depression, insomnia, tearfulness, panic attacks, hallucinations, paranoia, suicidal or homicidal ideation    Past Medical History  Diagnosis Date  . Back pain   . GERD (gastroesophageal reflux disease)   . MI (myocardial infarction) ([place])   . Hypercholesteremia   . Anemia   . Cirrhosis ([place])   . Aortic sclerosis ([place]) 07/04/2011  . Aortic insufficiency 07/04/2011    most recent 2D Echo 02/24/2012  EF greater than 55%  . Carotid stenosis, bilateral 07/04/2011    most recent 05/2010 carotid doppler  . Presence of stent in right coronary artery 07/04/2011  . HTN (hypertension) 07/04/2011  . Coronary artery disease   . Myocardial infarction ([place]) 1995  . GERD (gastroesophageal reflux disease)   . Non-alcoholic fatty liver disease   . UTI (lower urinary tract infection)     chronic/recurring/on bactrim  . CVA (cerebral infarction)     03/07/2014  . Hiatal hernia   . IBS (irritable bowel syndrome)   . Diabetes mellitus 2005  . Varices, esophageal ([place])   . Stroke ([place]) 03/07/2014    no deficits from stroke  . OSA (obstructive sleep apnea) 07/04/2011    sleep study 2009  AHI 9.91/hr and during REM sleep 35.58/hr  . DDD (degenerative disc disease), lumbar     Past Surgical History  Procedure Laterality Date  . Back surgery  1985  . Tonsillectomy    . Appendectomy    . Colonoscopy    .  Eye surgery  08    cataract surgery of the left eye  . Ercp    . Myringotomy  2012    both ears  . Hardware removal Right 01/17/2013    Procedure: REMOVAL OF HARDWARE AND EXCISION ULNAR STYLOID RIGHT WRIST;  Surgeon: Tennis Must, MD;  Location: [facility];  Service: Orthopedics;   Laterality: Right;  . Wrist surgery      rt wrist hardwear removal  . Esophagogastroduodenoscopy N/A 04/01/2013    Procedure: ESOPHAGOGASTRODUODENOSCOPY (EGD);  Surgeon: Rogene Houston, MD;  [facility];  Service: Endoscopy;  Laterality: N/A;  230-rescheduled to 8:30am Ann notified pt  . Esophageal banding N/A 04/01/2013    Procedure: ESOPHAGEAL BANDING;  Surgeon: Rogene Houston, MD;  [facility];  Service: Endoscopy;  Laterality: N/A;  . Esophagogastroduodenoscopy N/A 05/24/2013    Procedure: ESOPHAGOGASTRODUODENOSCOPY (EGD);  Surgeon: Rogene Houston, MD;  [facility];  Service: Endoscopy;  Laterality: N/A;  730  . Esophageal banding N/A 05/24/2013    Procedure: ESOPHAGEAL BANDING;  Surgeon: Rogene Houston, MD;  [facility];  Service: Endoscopy;  Laterality: N/A;  . Dilation and curettage of uterus      x2  . Vaginal hysterectomy  1972  . Anal fissure repair  1972  . Epi retinal membrane peel Left   . Esophagogastroduodenoscopy N/A 06/21/2014    Procedure: ESOPHAGOGASTRODUODENOSCOPY (EGD);  Surgeon: Rogene Houston, MD;  [facility];  Service: Endoscopy;  Laterality: N/A;  930-rescheduled 10/14 @ 1200 Ann to notify pt  . Esophageal banding N/A 06/21/2014    Procedure: ESOPHAGEAL BANDING;  Surgeon: Rogene Houston, MD;  [facility];  Service: Endoscopy;  Laterality: N/A;  . Cardiac catheterization  1995.,2006,1997    stent to the proximal RCA after MI   . Cataract extraction w/phaco Right 07/04/2014    Procedure: CATARACT EXTRACTION PHACO AND INTRAOCULAR LENS PLACEMENT (IOC);  Surgeon: Elta Guadeloupe T. Gershon Crane, MD;  Location: [facility];  Service: Ophthalmology;  Laterality: Right;  CDE:13.13    Medications:  HOME MEDS: Prior to Admission medications   Medication Sig Start Date End Date Taking? Authorizing Provider  aspirin EC 81 MG tablet Take 81 mg by mouth daily.   Yes Historical Provider, MD  atorvastatin (LIPITOR) 40 MG  tablet Take 40 mg by mouth daily.   Yes Historical Provider, MD  azithromycin (ZITHROMAX) 250 MG tablet Take 1 tablet by mouth daily. 10/11/15  Yes Historical Provider, MD  carvedilol (COREG) 12.5 MG tablet Take 12.5 mg by mouth 2 (two) times daily with a meal.  06/21/15  Yes Historical Provider, MD  doxylamine, Sleep, (UNISOM) 25 MG tablet Take 25 mg by mouth at bedtime as needed for sleep or rhinitis.    Yes Historical Provider, MD  HUMULIN N KWIKPEN 100 UNIT/ML Kiwkpen Inject 50 Units into the skin 2 (two) times daily.  07/11/14  Yes Historical Provider, MD  insulin glargine (LANTUS SOLOSTAR) 100 UNIT/ML injection Inject 50 Units into the skin at bedtime.    Yes Historical Provider, MD  isosorbide mononitrate (IMDUR) 30 MG 24 hr tablet Take 30 mg by mouth daily.   Yes Historical Provider, MD  loperamide (IMODIUM A-D) 2 MG capsule Take 4 mg by mouth as needed for diarrhea or loose stools.   Yes Historical Provider, MD  omeprazole (PRILOSEC OTC) 20 MG tablet Take 20 mg by mouth daily.   Yes Historical Provider, MD  ondansetron [facility])  4 MG tablet Take 1 tablet (4 mg total) by mouth every 8 (eight) hours as needed for nausea or vomiting. 07/09/15  Yes Francine Graven, DO  promethazine (PHENERGAN) 12.5 MG tablet Take 1 tablet (12.5 mg total) by mouth every 6 (six) hours as needed for nausea or vomiting. 10/13/15  Yes Julianne Rice, MD  sulfamethoxazole-trimethoprim (BACTRIM DS,SEPTRA DS) 800-160 MG per tablet Take 1 tablet by mouth 2 (two) times daily. For 10 days   Yes Historical Provider, MD  zolpidem (AMBIEN) 10 MG tablet Take 1 tablet (10 mg total) by mouth at bedtime. Patient not taking: Reported on 07/09/2015 04/19/15   Butch Penny, NP     Allergies:  Allergies  Allergen Reactions  . Tape Rash    ADHESIVE TAPE    Social History:   reports that she quit smoking about 3 years ago. Her smoking use included Cigarettes. She has a 12.5 pack-year smoking history. She has never used smokeless  tobacco. She reports that she does not drink alcohol or use illicit drugs.  Family History: Family History  Problem Relation Age of Onset  . Diabetes Mother   . Heart failure Father      Physical Exam: Filed Vitals:   10/15/15 0000 10/15/15 0015 10/15/15 0100 10/15/15 0115  BP: 167/49  162/50   Pulse:  82  84  Temp:      TempSrc:      Resp:      Height:      Weight:      SpO2:  100%  93%   Blood pressure 162/50, pulse 84, temperature 98.7 F (37.1 C), temperature source Oral, resp. rate 14, height 5' 3"  (1.6 m), weight 68.04 kg (150 lb), SpO2 93 %.  GEN:  Pleasant. Patient lying in the stretcher in no acute distress; cooperative with exam. PSYCH:  alert and oriented x4; does not appear anxious or depressed; affect is appropriate. HEENT: Mucous membranes pink and anicteric; PERRLA; EOM intact; no cervical lymphadenopathy nor thyromegaly or carotid bruit; no JVD; There were no stridor. Neck is very supple. Breasts:: Not examined CHEST WALL: No tenderness CHEST: Normal respiration, clear to auscultation bilaterally.  HEART: Regular rate and rhythm.  There are no murmur, rub, or gallops.   BACK: No kyphosis or scoliosis; no CVA tenderness ABDOMEN: soft and non-tender; no masses, no organomegaly, normal abdominal bowel sounds; no pannus; no intertriginous candida. There is no rebound and no distention. Rectal Exam: Not done EXTREMITIES: No bone or joint deformity; age-appropriate arthropathy of the hands and knees; no edema; no ulcerations.  There is no calf tenderness. Genitalia: not examined PULSES: 2+ and symmetric SKIN: Normal hydration no rash or ulceration CNS: Cranial nerves 2-12 grossly intact no focal lateralizing neurologic deficit.  Speech is fluent; uvula elevated with phonation, facial symmetry and tongue midline. DTR are normal bilaterally, cerebella exam is intact, barbinski is negative and strengths are equaled bilaterally.  No sensory loss.   Labs on Admission:   Basic Metabolic Panel:  Recent Labs Lab 10/13/15 0800 10/14/15 2339  NA 138 136  K 3.5 3.5  CL 106 103  CO2 24 22  GLUCOSE 136* 132*  BUN 28* 16  CREATININE 1.20* 0.89  CALCIUM 8.9 8.7*   Liver Function Tests:  Recent Labs Lab 10/13/15 0800 10/14/15 2339  AST 21 21  ALT 19 18  ALKPHOS 85 90  BILITOT 0.7 0.8  PROT 6.6 6.8  ALBUMIN 3.8 3.8    Recent Labs Lab 10/14/15 2339  LIPASE  26   CBC:  Recent Labs Lab 10/13/15 0800 10/14/15 2339  WBC 8.4 11.3*  NEUTROABS 6.4 8.7*  HGB 12.7 13.0  HCT 37.2 37.8  MCV 92.3 91.7  PLT 123* 158    Assessment/Plan  Viral Gastroenteritis. IBS and stressful situation DM Volume depletion Intractable nausea and vomiting +++  PLAN: Patient is being admitted for intractable nausea and vomiting without abdominal pain. Likely related to a GI illness. We will treat her supportively with IVF and Zofran. If she has any episodes of diarrhea during her admission, we will order a stool sample to r/o C.Diff, though no stool can be sent at this time, since no further diarrhea.  For her DM, we will start on 25U of Lantus (half her Qhs dose) and correct as needed. Will continue her Coreg for hypertension and Lipitor for HLD.  She is stable, full code, and will be admitted to [facility] service.  Thank you and Good Day.   Other plans as per orders. Code Status: Full    Orvan Falconer, MD. FACP [facility] Pager [phone] 7pm to 7am.  10/15/2015, 2:03 AM  By signing my name below, I, Rosalie Doctor, attest that this documentation has been prepared under the direction and in the presence of Orvan Falconer, MD Electronically Signed: Rosalie Doctor, Scribe. 10/15/2015

## 2015-10-15 NOTE — ED Notes (Signed)
Pt states nausea is better, but has had 3 loose stools.

## 2015-10-16 DIAGNOSIS — R1110 Vomiting, unspecified: Secondary | ICD-10-CM | POA: Diagnosis not present

## 2015-10-16 DIAGNOSIS — I10 Essential (primary) hypertension: Secondary | ICD-10-CM | POA: Diagnosis not present

## 2015-10-16 LAB — CBC
HCT: 35.2 % — ABNORMAL LOW (ref 36.0–46.0)
Hemoglobin: 12.1 g/dL (ref 12.0–15.0)
MCH: 32.3 pg (ref 26.0–34.0)
MCHC: 34.4 g/dL (ref 30.0–36.0)
MCV: 93.9 fL (ref 78.0–100.0)
PLATELETS: 129 10*3/uL — AB (ref 150–400)
RBC: 3.75 MIL/uL — AB (ref 3.87–5.11)
RDW: 12.9 % (ref 11.5–15.5)
WBC: 5.5 10*3/uL (ref 4.0–10.5)

## 2015-10-16 LAB — BASIC METABOLIC PANEL
Anion gap: 8 (ref 5–15)
BUN: 7 mg/dL (ref 6–20)
CHLORIDE: 113 mmol/L — AB (ref 101–111)
CO2: 23 mmol/L (ref 22–32)
CREATININE: 0.75 mg/dL (ref 0.44–1.00)
Calcium: 8.4 mg/dL — ABNORMAL LOW (ref 8.9–10.3)
GFR calc non Af Amer: >60 mL/min (ref >60)
GLUCOSE: 82 mg/dL (ref 65–99)
Potassium: 3.5 mmol/L (ref 3.5–5.1)
Sodium: 144 mmol/L (ref 135–145)

## 2015-10-16 LAB — GLUCOSE, CAPILLARY
GLUCOSE-CAPILLARY: 83 mg/dL (ref 65–99)
Glucose-Capillary: 73 mg/dL (ref 65–99)
Glucose-Capillary: 76 mg/dL (ref 65–99)

## 2015-10-16 NOTE — Progress Notes (Signed)
Pt's IV catheter removed and intact. Pt's IV site clean dry and intact. Discharge instructions, follow up appointments and medications reviewed and discussed with patient. Pt verbalized understanding of discharge instructions including medications. All questions were answered and no further questions at this time. Pt in stable condition and in no acute distress at time of discharge. Pt escorted by nurse tech.

## 2015-10-16 NOTE — Care Management Obs Status (Signed)
[place] NOTIFICATION   Patient Details  Name: [name] MRN: [id number] Date of Birth: 12/18/1940   Medicare Observation Status Notification Given:  Yes    Sherald Barge, RN 10/16/2015, 9:59 AM

## 2015-10-16 NOTE — Discharge Summary (Signed)
Physician Discharge Summary  [name] AXK:[id number] DOB: June 22, 1941 DOA: 10/14/2015  PCP: Rocky Morel, MD  Admit date: 10/14/2015 Discharge date: 10/16/2015  Time spent: 35 minutes  Recommendations for Outpatient Follow-up:  1. She was admitted for N/V/Diarrhea likely secondary to viral syndrome, symptoms resolving by day of discharge   Discharge Diagnoses:  Principal Problem:   Intractable nausea and vomiting Active Problems:   DM (diabetes mellitus) (HCC)   GERD (gastroesophageal reflux disease)   Aortic insufficiency   HTN (hypertension)   Hepatic cirrhosis ([place])   Discharge Condition: Stable/Improved  Diet recommendation: Carb Modified Diet  Filed Weights   10/14/15 2248 10/15/15 0434  Weight: 68.04 kg (150 lb) 71.305 kg (157 lb 3.2 oz)    History of present illness:  [name] is an 75 y.o. female with a hx of GERD, HLD, CAD, HTN, and DM that presents with nausea and vomiting that began 1 week ago while at her husband's funeral. Patient states that she has about 5-10 episodes of emesis per day. She also reports having diarrhea 1 day that resolved with Imodium. She denies any recent sick contacts. She was seen at Dr. Delanna Ahmadi office on 10/11/15 and was given a Cortisone shot, Z pack, and Zofran with no relief. She denies any abdominal pain, fever, chills, SOB, or CP. While in the ED, labs revealed an unremarkable CMP, WBC 11.3, and normal lactic acid. Hospitalist was asked to admit her for intractable nausea and vomiting. She was also on bactrim for URI as well. She had diarrhea last week, but her symptoms have improved, and she no longer had diarrhea.   Hospital Course:  [name] is a pleasant 75 year old female with history of hypertension coronary artery disease diabetes who presented with complaints of nausea, vomiting and diarrhea over the past week. She denies recent travels, sick contacts, abdominal pain, fevers, chills. She does report recently  taking a deep for upper respiratory tract infection. She appeared dehydrated on physical examination and was admitted overnight and started on IV fluids.She does report recently been diagnosed with upper respiratory tract infection for which she was prescribed a Z-Pak. Her recent antibiotic use making C. difficile colitis a consideration. Stool studies fortunately were negative for C diff. GI panel was negative. Suspect symptoms were related to a viral syndrome. By 10/16/2015 she was feeling much better with resolution of her symptoms.   Discharge Exam: Filed Vitals:   10/15/15 2000 10/16/15 0456  BP: 124/41 128/48  Pulse: 64 79  Temp: 98.9 F (37.2 C) 98.6 F (37 C)  Resp: 18 16    General: Nontoxic appearing, in no acute distress. States feeling ready to go home today Cardiovascular: RRR NL S1S2 Respiratory: Normal inspiratory effort, lungs clear to auscultation Abdomen: S, NT, ND Extremities: No edema  Discharge Instructions   Discharge Instructions    Call MD for:  difficulty breathing, headache or visual disturbances    Complete by:  As directed      Call MD for:  extreme fatigue    Complete by:  As directed      Call MD for:  hives    Complete by:  As directed      Call MD for:  persistant dizziness or light-headedness    Complete by:  As directed      Call MD for:  persistant nausea and vomiting    Complete by:  As directed      Call MD for:  redness, tenderness, or signs of  infection (pain, swelling, redness, odor or green/yellow discharge around incision site)    Complete by:  As directed      Call MD for:  severe uncontrolled pain    Complete by:  As directed      Call MD for:  temperature >100.4    Complete by:  As directed      Call MD for:    Complete by:  As directed      Diet - low sodium heart healthy    Complete by:  As directed      Increase activity slowly    Complete by:  As directed           Current Discharge Medication List    CONTINUE these  medications which have NOT CHANGED   Details  aspirin EC 81 MG tablet Take 81 mg by mouth daily.    atorvastatin (LIPITOR) 40 MG tablet Take 40 mg by mouth daily.    carvedilol (COREG) 12.5 MG tablet Take 12.5 mg by mouth 2 (two) times daily with a meal.     doxylamine, Sleep, (UNISOM) 25 MG tablet Take 25 mg by mouth at bedtime as needed for sleep or rhinitis.     HUMULIN N KWIKPEN 100 UNIT/ML Kiwkpen Inject 50 Units into the skin 2 (two) times daily.     insulin glargine (LANTUS SOLOSTAR) 100 UNIT/ML injection Inject 50 Units into the skin at bedtime.     isosorbide mononitrate (IMDUR) 30 MG 24 hr tablet Take 30 mg by mouth daily.    loperamide (IMODIUM A-D) 2 MG capsule Take 4 mg by mouth as needed for diarrhea or loose stools.    omeprazole (PRILOSEC OTC) 20 MG tablet Take 20 mg by mouth daily.    promethazine (PHENERGAN) 12.5 MG tablet Take 1 tablet (12.5 mg total) by mouth every 6 (six) hours as needed for nausea or vomiting. Qty: 15 tablet, Refills: 0      STOP taking these medications     azithromycin (ZITHROMAX) 250 MG tablet      ondansetron (ZOFRAN) 4 MG tablet      sulfamethoxazole-trimethoprim (BACTRIM DS,SEPTRA DS) 800-160 MG per tablet      zolpidem (AMBIEN) 10 MG tablet        Allergies  Allergen Reactions  . Tape Rash    ADHESIVE TAPE      The results of significant diagnostics from this hospitalization (including imaging, microbiology, ancillary and laboratory) are listed below for reference.    Significant Diagnostic Studies: No results found.  Microbiology: Recent Results (from the past 240 hour(s))  Urine culture     Status: None   Collection Time: 10/13/15  8:50 AM  Result Value Ref Range Status   Specimen Description URINE, CLEAN CATCH  Final   Special Requests zithromax, bactrim ds for chronic uti  Final   Culture   Final    4,000 COLONIES/mL INSIGNIFICANT GROWTH Performed at [facility]    Report Status 10/15/2015 FINAL   Final  Gastrointestinal Panel by PCR , Stool     Status: None   Collection Time: 10/15/15  5:47 AM  Result Value Ref Range Status   Campylobacter species NOT DETECTED NOT DETECTED Final   Plesimonas shigelloides NOT DETECTED NOT DETECTED Final   Salmonella species NOT DETECTED NOT DETECTED Final   Yersinia enterocolitica NOT DETECTED NOT DETECTED Final   Vibrio species NOT DETECTED NOT DETECTED Final   Vibrio cholerae NOT DETECTED NOT DETECTED Final   Enteroaggregative E  coli (EAEC) NOT DETECTED NOT DETECTED Final   Enteropathogenic E coli (EPEC) NOT DETECTED NOT DETECTED Final   Enterotoxigenic E coli (ETEC) NOT DETECTED NOT DETECTED Final   Shiga like toxin producing E coli (STEC) NOT DETECTED NOT DETECTED Final   E. coli O157 NOT DETECTED NOT DETECTED Final   Shigella/Enteroinvasive E coli (EIEC) NOT DETECTED NOT DETECTED Final   Cryptosporidium NOT DETECTED NOT DETECTED Final   Cyclospora cayetanensis NOT DETECTED NOT DETECTED Final   Entamoeba histolytica NOT DETECTED NOT DETECTED Final   Giardia lamblia NOT DETECTED NOT DETECTED Final   Adenovirus F40/41 NOT DETECTED NOT DETECTED Final   Astrovirus NOT DETECTED NOT DETECTED Final   Norovirus GI/GII NOT DETECTED NOT DETECTED Final   Rotavirus A NOT DETECTED NOT DETECTED Final   Sapovirus (I, II, IV, and V) NOT DETECTED NOT DETECTED Final  C difficile quick scan w PCR reflex     Status: None   Collection Time: 10/15/15  5:47 AM  Result Value Ref Range Status   C Diff antigen NEGATIVE NEGATIVE Final   C Diff toxin NEGATIVE NEGATIVE Final   C Diff interpretation Negative for toxigenic C. difficile  Final     Labs: Basic Metabolic Panel:  Recent Labs Lab 10/13/15 0800 10/14/15 2339 10/15/15 0601 10/16/15 0522  NA 138 136  --  144  K 3.5 3.5  --  3.5  CL 106 103  --  113*  CO2 24 22  --  23  GLUCOSE 136* 132*  --  82  BUN 28* 16  --  7  CREATININE 1.20* 0.89 0.80 0.75  CALCIUM 8.9 8.7*  --  8.4*   Liver Function  Tests:  Recent Labs Lab 10/13/15 0800 10/14/15 2339  AST 21 21  ALT 19 18  ALKPHOS 85 90  BILITOT 0.7 0.8  PROT 6.6 6.8  ALBUMIN 3.8 3.8    Recent Labs Lab 10/14/15 2339  LIPASE 26   No results for input(s): AMMONIA in the last 168 hours. CBC:  Recent Labs Lab 10/13/15 0800 10/14/15 2339 10/15/15 0601 10/16/15 0522  WBC 8.4 11.3* 9.8 5.5  NEUTROABS 6.4 8.7*  --   --   HGB 12.7 13.0 12.7 12.1  HCT 37.2 37.8 37.0 35.2*  MCV 92.3 91.7 92.0 93.9  PLT 123* 158 148* 129*   Cardiac Enzymes: No results for input(s): CKTOTAL, CKMB, CKMBINDEX, TROPONINI in the last 168 hours. BNP: BNP (last 3 results) No results for input(s): BNP in the last 8760 hours.  ProBNP (last 3 results) No results for input(s): PROBNP in the last 8760 hours.  CBG:  Recent Labs Lab 10/15/15 1141 10/15/15 1659 10/15/15 2105 10/16/15 0046 10/16/15 0456  GLUCAP 101* 97 77 76 73       Signed:  Kelvin Cellar MD.  [facility] 10/16/2015, 7:29 AM

## 2015-10-16 NOTE — Care Management Note (Signed)
Case Management Note  Patient Details  Name: SHOSHANNAH FAUBERT MRN: [id number] Date of Birth: Mar 22, 1941  Subjective/Objective:                  Pt is from home and ind with ADL's. Pt has no DME or HH services prior to admission. Pt DC home today with self care.   Action/Plan: No CM needs.   Expected Discharge Date:  10/16/15               Expected Discharge Plan:  Home/Self Care  In-House Referral:  NA  Discharge planning Services  CM Consult  Post Acute Care Choice:  NA Choice offered to:  NA  DME Arranged:    DME Agency:     HH Arranged:    HH Agency:     Status of Service:  Completed, signed off  Medicare Important Message Given:    Date Medicare IM Given:    Medicare IM give by:    Date Additional Medicare IM Given:    Additional Medicare Important Message give by:     If discussed at [place] of Stay Meetings, dates discussed:    Additional Comments:  Sherald Barge, RN 10/16/2015, 9:59 AM

## 2015-10-19 DIAGNOSIS — G4701 Insomnia due to medical condition: Secondary | ICD-10-CM | POA: Diagnosis not present

## 2015-10-19 DIAGNOSIS — R05 Cough: Secondary | ICD-10-CM | POA: Diagnosis not present

## 2015-10-19 DIAGNOSIS — E119 Type 2 diabetes mellitus without complications: Secondary | ICD-10-CM | POA: Diagnosis not present

## 2015-10-19 DIAGNOSIS — Z1389 Encounter for screening for other disorder: Secondary | ICD-10-CM | POA: Diagnosis not present

## 2015-11-28 ENCOUNTER — Ambulatory Visit (INDEPENDENT_AMBULATORY_CARE_PROVIDER_SITE_OTHER): Payer: Self-pay | Admitting: Internal Medicine

## 2015-12-05 DIAGNOSIS — E119 Type 2 diabetes mellitus without complications: Secondary | ICD-10-CM | POA: Diagnosis not present

## 2015-12-05 DIAGNOSIS — Z1389 Encounter for screening for other disorder: Secondary | ICD-10-CM | POA: Diagnosis not present

## 2015-12-05 DIAGNOSIS — K219 Gastro-esophageal reflux disease without esophagitis: Secondary | ICD-10-CM | POA: Diagnosis not present

## 2015-12-05 DIAGNOSIS — R112 Nausea with vomiting, unspecified: Secondary | ICD-10-CM | POA: Diagnosis not present

## 2015-12-05 DIAGNOSIS — K2970 Gastritis, unspecified, without bleeding: Secondary | ICD-10-CM | POA: Diagnosis not present

## 2015-12-17 ENCOUNTER — Encounter (INDEPENDENT_AMBULATORY_CARE_PROVIDER_SITE_OTHER): Payer: Self-pay | Admitting: *Deleted

## 2015-12-17 ENCOUNTER — Encounter (INDEPENDENT_AMBULATORY_CARE_PROVIDER_SITE_OTHER): Payer: Self-pay | Admitting: Internal Medicine

## 2015-12-17 ENCOUNTER — Other Ambulatory Visit (INDEPENDENT_AMBULATORY_CARE_PROVIDER_SITE_OTHER): Payer: Self-pay | Admitting: Internal Medicine

## 2015-12-17 ENCOUNTER — Other Ambulatory Visit (INDEPENDENT_AMBULATORY_CARE_PROVIDER_SITE_OTHER): Payer: Self-pay | Admitting: *Deleted

## 2015-12-17 ENCOUNTER — Ambulatory Visit (INDEPENDENT_AMBULATORY_CARE_PROVIDER_SITE_OTHER): Payer: Medicare Other | Admitting: Internal Medicine

## 2015-12-17 VITALS — BP 132/50 | HR 80 | Temp 98.1°F | Ht 62.0 in | Wt 153.0 lb

## 2015-12-17 DIAGNOSIS — K7460 Unspecified cirrhosis of liver: Secondary | ICD-10-CM | POA: Diagnosis not present

## 2015-12-17 DIAGNOSIS — D369 Benign neoplasm, unspecified site: Secondary | ICD-10-CM

## 2015-12-17 DIAGNOSIS — K5909 Other constipation: Secondary | ICD-10-CM

## 2015-12-17 DIAGNOSIS — R634 Abnormal weight loss: Secondary | ICD-10-CM

## 2015-12-17 DIAGNOSIS — K7469 Other cirrhosis of liver: Secondary | ICD-10-CM | POA: Diagnosis not present

## 2015-12-17 NOTE — Progress Notes (Signed)
Subjective:    Patient ID: [name], female    DOB: 30-Jan-1941, 75 y.o.   MRN: [id number]  HPI Here today for f/u. She was last seen in August. Hx of cirrhosis. Hx of esophageal bleed in 2014.  She underwent an esophageal in October 2015 (see below) She tells me she has had some horrible constipation. She was constipated about 3 weeks and relieved herself with Glycerin supp. She says she is having to strain to have a bowel movements. She says her breath smells and tastes awful. She says she has no appetite. She is supplementing her diet with Boost. She says her appetite is not good. She is forcing her self to eat.  She says she is just not hungry. She has lost about 22 pounds over the past 6 months. She is having a BM about once a week. She is not taking anything for her BM. She was having a BM daily.  She has been to her dentist to be sure her breath was not from her teeth. She denies vomiting any blood. No nausea or vomiting.  Recently lost her husband in January of this year. Marland Kitchen  Hx of diabetes and took her self off her diabetic medication about a year ago. Blood sugars in the 90s.     Admitted to [facility] in February with a viral syndrome. Colonoscopy 2008:  Anemia. Few diverticula at sigmoid colon. Three small polyps ablated.  Suspicious for adenomas.  Prominent submucosal rectal veins. Biopsy: Tubular adenoma.   CBC    Component Value Date/Time   WBC 5.5 10/16/2015 0522   RBC 3.75* 10/16/2015 0522   HGB 12.1 10/16/2015 0522   HCT 35.2* 10/16/2015 0522   PLT 129* 10/16/2015 0522   MCV 93.9 10/16/2015 0522   MCH 32.3 10/16/2015 0522   MCHC 34.4 10/16/2015 0522   RDW 12.9 10/16/2015 0522   LYMPHSABS 1.5 10/14/2015 2339   MONOABS 0.8 10/14/2015 2339   EOSABS 0.3 10/14/2015 2339   BASOSABS 0.0 10/14/2015 2339   Hepatic Function Panel     Component Value Date/Time   PROT 6.8 10/14/2015 2339   ALBUMIN 3.8 10/14/2015 2339   AST 21 10/14/2015 2339   ALT 18 10/14/2015 2339     ALKPHOS 90 10/14/2015 2339   BILITOT 0.8 10/14/2015 2339   BILIDIR 0.1 04/19/2015 1431   IBILI 0.4 04/19/2015 1431           07/09/2015 CT abdomen/pelvis with CM: N,V,D.  IMPRESSION: 1. No acute abdominal pelvic pathology. 2. Diverticulosis without evidence of diverticulitis. 3. Nodular contour of the liver with a diminutive size and with lower paraesophageal varices most concerning for cirrhosis with portal hypertension. 4. Grade 1 anterolisthesis of L5 on S1 secondary to bilateral pars interarticularis defects.    06/21/2014: Procedure: EGD with esophageal variceal banding.  Indications: Patient is 75 year old Caucasian female with cirrhosis secondary to half old complicated by esophageal variceal bleed last year. She is now returning for EGD and banding for recurrent varices. She was hospitalized about 3 months ago in order to have heme positive stool and low hemoglobin but there is no history of melena or rectal bleeding. Hemoglobin 7 weeks ago was up to 11.8 g. Patient is on low-dose aspirin because of history of CVA(4 months ago)  Impression: Two columns of esophageal varices were banded. Portal gastropath Gastric antral vascular ectasia without stigmata of bleed.         Review of Systems Past Medical History  Diagnosis Date  .  Back pain   . GERD (gastroesophageal reflux disease)   . MI (myocardial infarction) ([place])   . Hypercholesteremia   . Anemia   . Cirrhosis ([place])   . Aortic sclerosis ([place]) 07/04/2011  . Aortic insufficiency 07/04/2011    most recent 2D Echo 02/24/2012  EF greater than 55%  . Carotid stenosis, bilateral 07/04/2011    most recent 05/2010 carotid doppler  . Presence of stent in right coronary artery 07/04/2011  . HTN (hypertension) 07/04/2011  . Coronary artery disease   . Myocardial infarction ([place]) 1995  . GERD (gastroesophageal reflux disease)   . Non-alcoholic fatty liver disease   . UTI (lower  urinary tract infection)     chronic/recurring/on bactrim  . CVA (cerebral infarction)     03/07/2014  . Hiatal hernia   . IBS (irritable bowel syndrome)   . Diabetes mellitus 2005  . Varices, esophageal ([place])   . Stroke ([place]) 03/07/2014    no deficits from stroke  . OSA (obstructive sleep apnea) 07/04/2011    sleep study 2009  AHI 9.91/hr and during REM sleep 35.58/hr  . DDD (degenerative disc disease), lumbar     Past Surgical History  Procedure Laterality Date  . Back surgery  1985  . Tonsillectomy    . Appendectomy    . Colonoscopy    . Eye surgery  08    cataract surgery of the left eye  . Ercp    . Myringotomy  2012    both ears  . Hardware removal Right 01/17/2013    Procedure: REMOVAL OF HARDWARE AND EXCISION ULNAR STYLOID RIGHT WRIST;  Surgeon: Tennis Must, MD;  Location: [facility];  Service: Orthopedics;  Laterality: Right;  . Wrist surgery      rt wrist hardwear removal  . Esophagogastroduodenoscopy N/A 04/01/2013    Procedure: ESOPHAGOGASTRODUODENOSCOPY (EGD);  Surgeon: Rogene Houston, MD;  [facility];  Service: Endoscopy;  Laterality: N/A;  230-rescheduled to 8:30am Ann notified pt  . Esophageal banding N/A 04/01/2013    Procedure: ESOPHAGEAL BANDING;  Surgeon: Rogene Houston, MD;  [facility];  Service: Endoscopy;  Laterality: N/A;  . Esophagogastroduodenoscopy N/A 05/24/2013    Procedure: ESOPHAGOGASTRODUODENOSCOPY (EGD);  Surgeon: Rogene Houston, MD;  [facility];  Service: Endoscopy;  Laterality: N/A;  730  . Esophageal banding N/A 05/24/2013    Procedure: ESOPHAGEAL BANDING;  Surgeon: Rogene Houston, MD;  [facility];  Service: Endoscopy;  Laterality: N/A;  . Dilation and curettage of uterus      x2  . Vaginal hysterectomy  1972  . Anal fissure repair  1972  . Epi retinal membrane peel Left   . Esophagogastroduodenoscopy N/A 06/21/2014    Procedure: ESOPHAGOGASTRODUODENOSCOPY (EGD);  Surgeon:  Rogene Houston, MD;  [facility];  Service: Endoscopy;  Laterality: N/A;  930-rescheduled 10/14 @ 1200 Ann to notify pt  . Esophageal banding N/A 06/21/2014    Procedure: ESOPHAGEAL BANDING;  Surgeon: Rogene Houston, MD;  [facility];  Service: Endoscopy;  Laterality: N/A;  . Cardiac catheterization  1995.,2006,1997    stent to the proximal RCA after MI   . Cataract extraction w/phaco Right 07/04/2014    Procedure: CATARACT EXTRACTION PHACO AND INTRAOCULAR LENS PLACEMENT (IOC);  Surgeon: Elta Guadeloupe T. Gershon Crane, MD;  Location: [facility];  Service: Ophthalmology;  Laterality: Right;  CDE:13.13    Allergies  Allergen Reactions  . Tape Rash  ADHESIVE TAPE    Current Outpatient Prescriptions on File Prior to Visit  Medication Sig Dispense Refill  . aspirin EC 81 MG tablet Take 81 mg by mouth daily.    Marland Kitchen atorvastatin (LIPITOR) 40 MG tablet Take 40 mg by mouth daily. Reported on 12/17/2015    . carvedilol (COREG) 12.5 MG tablet Take 12.5 mg by mouth 2 (two) times daily with a meal.     . doxylamine, Sleep, (UNISOM) 25 MG tablet Take 25 mg by mouth at bedtime as needed for sleep or rhinitis.     Marland Kitchen isosorbide mononitrate (IMDUR) 30 MG 24 hr tablet Take 30 mg by mouth daily.    Marland Kitchen loperamide (IMODIUM A-D) 2 MG capsule Take 4 mg by mouth as needed for diarrhea or loose stools.    Marland Kitchen omeprazole (PRILOSEC OTC) 20 MG tablet Take 20 mg by mouth daily.    . promethazine (PHENERGAN) 12.5 MG tablet Take 1 tablet (12.5 mg total) by mouth every 6 (six) hours as needed for nausea or vomiting. 15 tablet 0   No current facility-administered medications on file prior to visit.        Objective:   Physical ExamBlood pressure 132/50, pulse 80, temperature 98.1 F (36.7 C), height 5' 2"  (1.575 m), weight 153 lb (69.4 kg). Alert and oriented. Skin warm and dry. Oral mucosa is moist.   . Sclera anicteric, conjunctivae is pink. Thyroid not enlarged. No cervical lymphadenopathy. Lungs clear. Heart  regular rate and rhythm.  Abdomen is soft. Bowel sounds are positive. No hepatomegaly. No abdominal masses felt. No tenderness.  No edema to lower extremities.          Assessment & Plan:  Constipation: new onset. Will try her on Miralax 1 scoop a day.  Colonoscopy for hx of tubular adenoma. The risks and benefits such as perforation, bleeding, and infection were reviewed with the patient and is agreeable. Cirrhosis: Needs surveillance  Hepatic function. CBC and AFP, US abdomen Weight loss: ? Etiology. Recently loss her husband in January. Will get a cortisol level.

## 2015-12-17 NOTE — Patient Instructions (Signed)
Colonoscopy.  The risks and benefits such as perforation, bleeding, and infection were reviewed with the patient and is agreeable. 

## 2015-12-17 NOTE — Telephone Encounter (Signed)
Patient needs trilyte 

## 2015-12-18 ENCOUNTER — Telehealth (INDEPENDENT_AMBULATORY_CARE_PROVIDER_SITE_OTHER): Payer: Self-pay | Admitting: Internal Medicine

## 2015-12-18 LAB — CBC WITH DIFFERENTIAL/PLATELET
BASOS ABS: 0 {cells}/uL (ref 0–200)
Basophils Relative: 0 %
EOS ABS: 210 {cells}/uL (ref 15–500)
EOS PCT: 2 %
HCT: 39.1 % (ref 35.0–45.0)
HEMOGLOBIN: 12.9 g/dL (ref 11.7–15.5)
LYMPHS ABS: 1680 {cells}/uL (ref 850–3900)
Lymphocytes Relative: 16 %
MCH: 30.4 pg (ref 27.0–33.0)
MCHC: 33 g/dL (ref 32.0–36.0)
MCV: 92.2 fL (ref 80.0–100.0)
MPV: 9.8 fL (ref 7.5–12.5)
Monocytes Absolute: 735 cells/uL (ref 200–950)
Monocytes Relative: 7 %
NEUTROS ABS: 7875 {cells}/uL — AB (ref 1500–7800)
Neutrophils Relative %: 75 %
Platelets: 161 10*3/uL (ref 140–400)
RBC: 4.24 MIL/uL (ref 3.80–5.10)
RDW: 14.1 % (ref 11.0–15.0)
WBC: 10.5 10*3/uL (ref 3.8–10.8)

## 2015-12-18 LAB — HEPATIC FUNCTION PANEL
ALBUMIN: 3.8 g/dL (ref 3.6–5.1)
ALK PHOS: 101 U/L (ref 33–130)
ALT: 26 U/L (ref 6–29)
AST: 29 U/L (ref 10–35)
BILIRUBIN DIRECT: 0.1 mg/dL (ref <=0.2)
BILIRUBIN TOTAL: 0.6 mg/dL (ref 0.2–1.2)
Indirect Bilirubin: 0.5 mg/dL (ref 0.2–1.2)
Total Protein: 6.4 g/dL (ref 6.1–8.1)

## 2015-12-18 LAB — CORTISOL: Cortisol, Plasma: 22.4 ug/dL — ABNORMAL HIGH

## 2015-12-18 LAB — AFP TUMOR MARKER: AFP-Tumor Marker: 9.3 ng/mL — ABNORMAL HIGH (ref <6.1)

## 2015-12-18 MED ORDER — PEG 3350-KCL-NA BICARB-NACL 420 G PO SOLR: 4000.0000 mL | Freq: Once | ORAL | Status: DC

## 2015-12-18 NOTE — Telephone Encounter (Signed)
Error

## 2015-12-19 ENCOUNTER — Ambulatory Visit (HOSPITAL_COMMUNITY)
Admission: RE | Admit: 2015-12-19 | Discharge: 2015-12-19 | Disposition: A | Discharge status: Home / Self Care | Payer: Medicare Other | Source: Ambulatory Visit | Attending: Internal Medicine | Admitting: Internal Medicine

## 2015-12-19 DIAGNOSIS — K7469 Other cirrhosis of liver: Secondary | ICD-10-CM | POA: Diagnosis not present

## 2015-12-19 DIAGNOSIS — K7460 Unspecified cirrhosis of liver: Secondary | ICD-10-CM | POA: Diagnosis not present

## 2015-12-23 IMAGING — DX DG CHEST 2V
2 series · 2 of 2 positions shown · non-contrast
Comparison: 09/03/2014.

CLINICAL DATA: Initial encounter for nonproductive cough and chest
congestion for 2 weeks. Mid chest pain and shortness of Breath
starting this morning.

EXAM:
CHEST  2 VIEW

[chest pa]
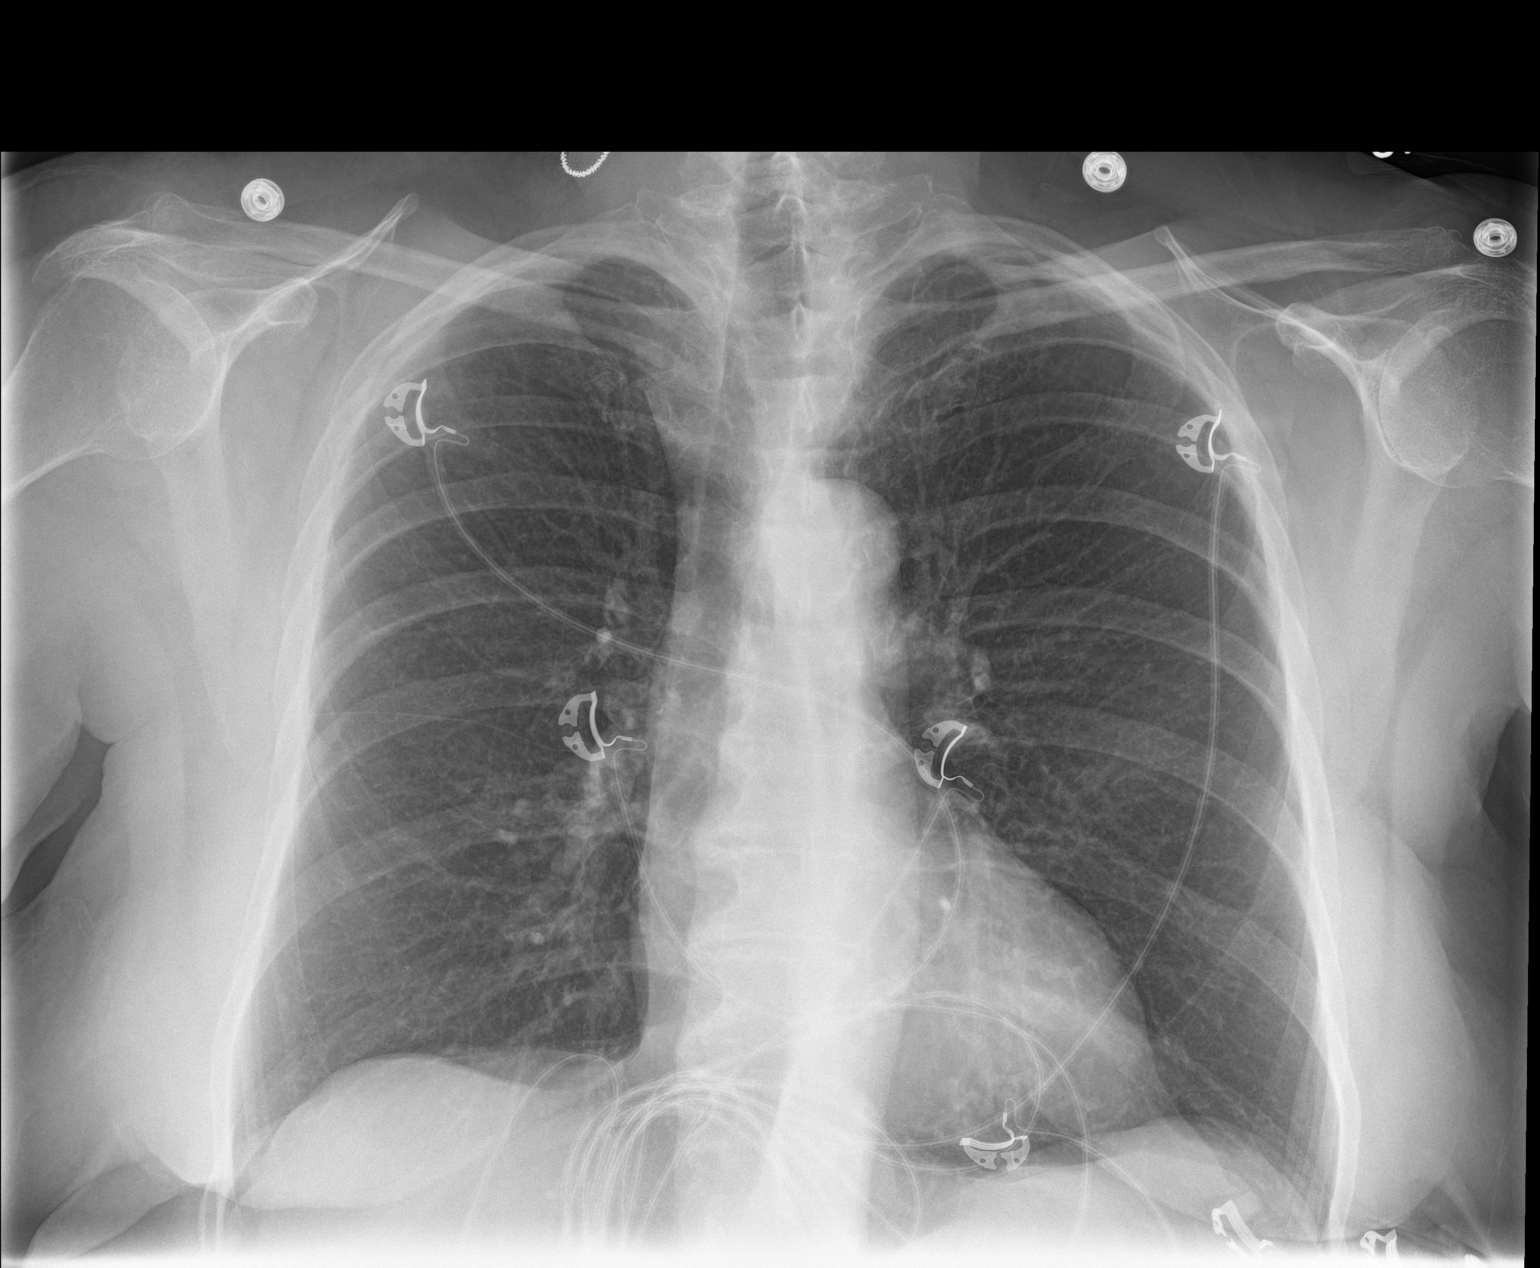

[chest lat]
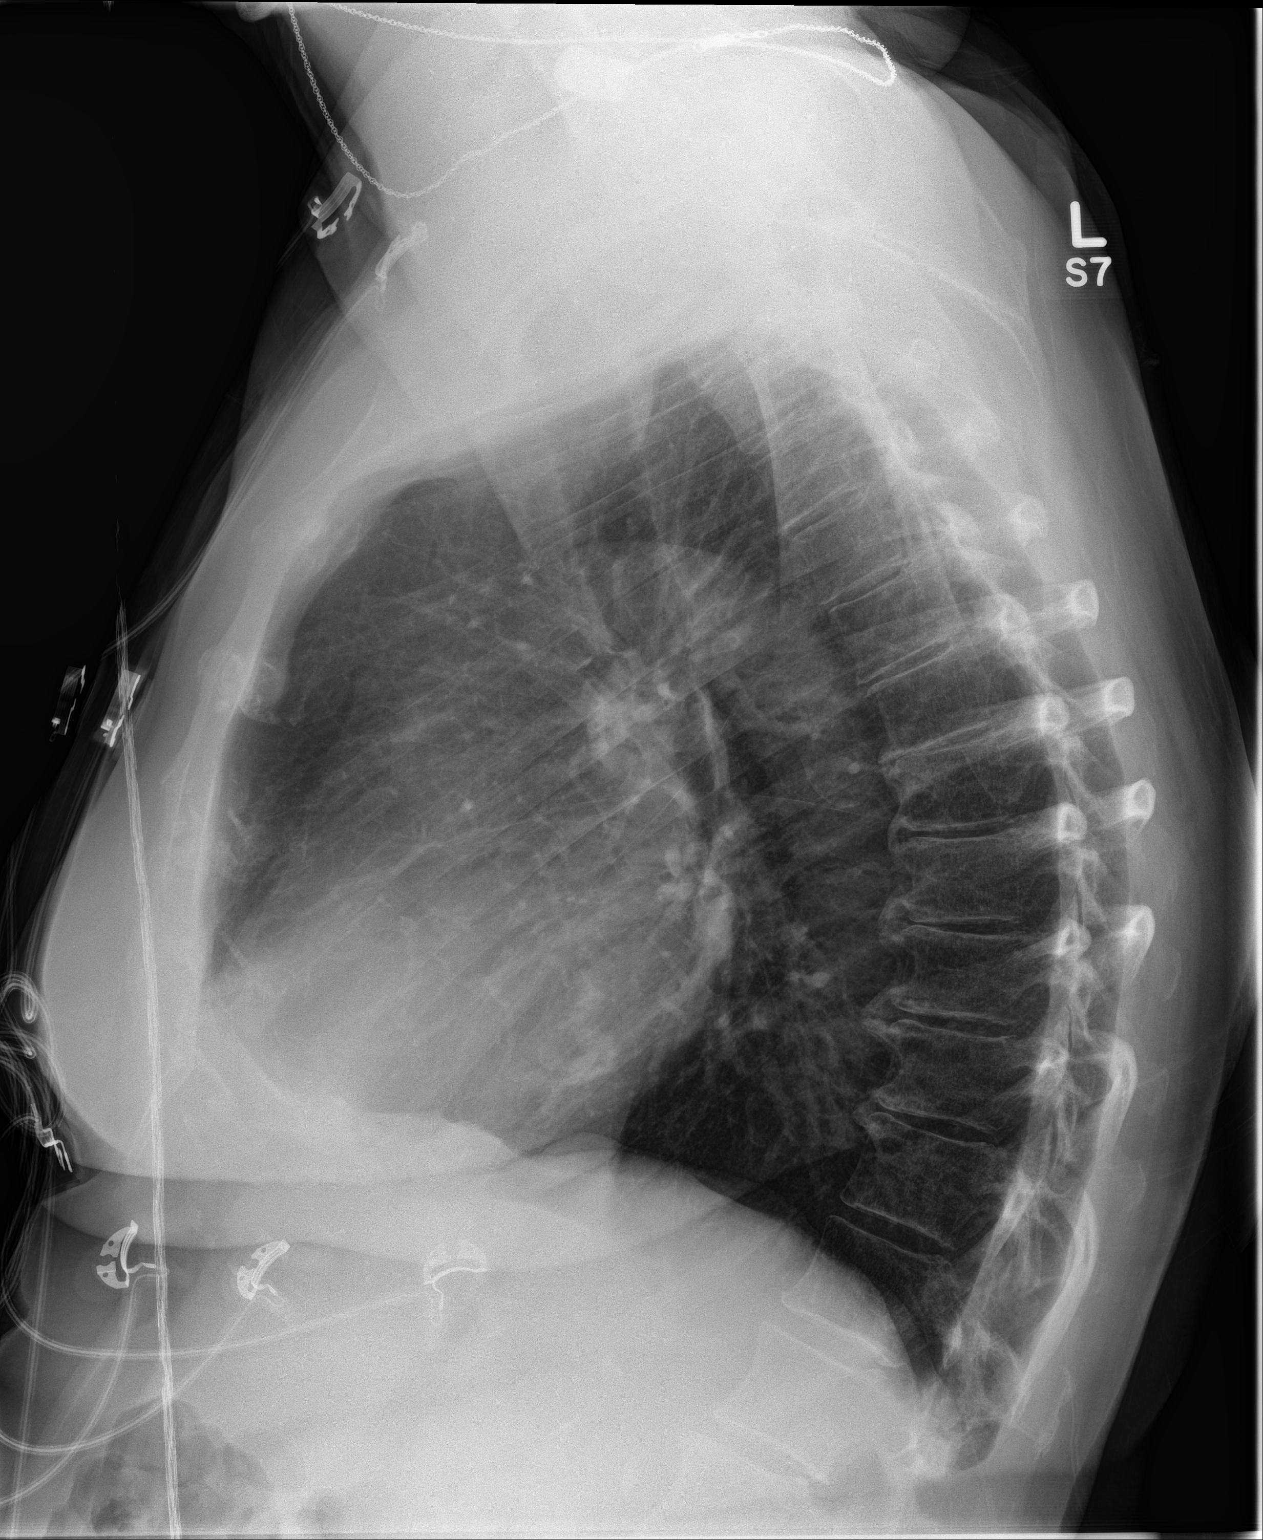

[2 of 2 positions shown; findings below may reference images not displayed]

FINDINGS: The lungs are clear without focal pneumonia, edema, pneumothorax or
pleural effusion. Hyperexpansion is consistent with emphysema. The
cardiopericardial silhouette is within normal limits for size.
Imaged bony structures of the thorax are intact. Telemetry leads
overlie the chest.
IMPRESSION: Hyperexpansion without acute cardiopulmonary findings.

## 2015-12-25 ENCOUNTER — Encounter (HOSPITAL_COMMUNITY): Payer: Self-pay | Admitting: Internal Medicine

## 2015-12-25 ENCOUNTER — Emergency Department (HOSPITAL_COMMUNITY)
Admission: EM | Admit: 2015-12-25 | Discharge: 2015-12-25 | Disposition: A | Discharge status: Home / Self Care | Payer: Medicare Other | Attending: Emergency Medicine | Admitting: Emergency Medicine

## 2015-12-25 DIAGNOSIS — Z955 Presence of coronary angioplasty implant and graft: Secondary | ICD-10-CM | POA: Insufficient documentation

## 2015-12-25 DIAGNOSIS — I2510 Atherosclerotic heart disease of native coronary artery without angina pectoris: Secondary | ICD-10-CM | POA: Diagnosis not present

## 2015-12-25 DIAGNOSIS — R000 Tachycardia, unspecified: Secondary | ICD-10-CM | POA: Diagnosis not present

## 2015-12-25 DIAGNOSIS — I252 Old myocardial infarction: Secondary | ICD-10-CM | POA: Diagnosis not present

## 2015-12-25 DIAGNOSIS — I10 Essential (primary) hypertension: Secondary | ICD-10-CM | POA: Insufficient documentation

## 2015-12-25 DIAGNOSIS — E119 Type 2 diabetes mellitus without complications: Secondary | ICD-10-CM | POA: Diagnosis not present

## 2015-12-25 DIAGNOSIS — R112 Nausea with vomiting, unspecified: Secondary | ICD-10-CM | POA: Diagnosis not present

## 2015-12-25 DIAGNOSIS — Z8673 Personal history of transient ischemic attack (TIA), and cerebral infarction without residual deficits: Secondary | ICD-10-CM | POA: Diagnosis not present

## 2015-12-25 DIAGNOSIS — Z7982 Long term (current) use of aspirin: Secondary | ICD-10-CM | POA: Insufficient documentation

## 2015-12-25 DIAGNOSIS — Z87891 Personal history of nicotine dependence: Secondary | ICD-10-CM | POA: Diagnosis not present

## 2015-12-25 DIAGNOSIS — Z79899 Other long term (current) drug therapy: Secondary | ICD-10-CM | POA: Insufficient documentation

## 2015-12-25 LAB — COMPREHENSIVE METABOLIC PANEL
ALK PHOS: 93 U/L (ref 38–126)
ALT: 25 U/L (ref 14–54)
AST: 26 U/L (ref 15–41)
Albumin: 3.9 g/dL (ref 3.5–5.0)
Anion gap: 10 (ref 5–15)
BUN: 19 mg/dL (ref 6–20)
CALCIUM: 8.6 mg/dL — AB (ref 8.9–10.3)
CHLORIDE: 108 mmol/L (ref 101–111)
CO2: 26 mmol/L (ref 22–32)
CREATININE: 0.96 mg/dL (ref 0.44–1.00)
GFR calc non Af Amer: 57 mL/min — ABNORMAL LOW (ref >60)
Glucose, Bld: 129 mg/dL — ABNORMAL HIGH (ref 65–99)
Potassium: 3.4 mmol/L — ABNORMAL LOW (ref 3.5–5.1)
SODIUM: 144 mmol/L (ref 135–145)
Total Bilirubin: 0.7 mg/dL (ref 0.3–1.2)
Total Protein: 6.8 g/dL (ref 6.5–8.1)

## 2015-12-25 LAB — CBC WITH DIFFERENTIAL/PLATELET
BASOS ABS: 0 10*3/uL (ref 0.0–0.1)
Basophils Relative: 0 %
EOS ABS: 0.1 10*3/uL (ref 0.0–0.7)
EOS PCT: 1 %
HCT: 37.1 % (ref 36.0–46.0)
HEMOGLOBIN: 12.8 g/dL (ref 12.0–15.0)
LYMPHS ABS: 1 10*3/uL (ref 0.7–4.0)
LYMPHS PCT: 14 %
MCH: 31.6 pg (ref 26.0–34.0)
MCHC: 34.5 g/dL (ref 30.0–36.0)
MCV: 91.6 fL (ref 78.0–100.0)
Monocytes Absolute: 0.5 10*3/uL (ref 0.1–1.0)
Monocytes Relative: 6 %
NEUTROS PCT: 79 %
Neutro Abs: 5.8 10*3/uL (ref 1.7–7.7)
PLATELETS: 129 10*3/uL — AB (ref 150–400)
RBC: 4.05 MIL/uL (ref 3.87–5.11)
RDW: 13.2 % (ref 11.5–15.5)
WBC: 7.3 10*3/uL (ref 4.0–10.5)

## 2015-12-25 MED ORDER — ONDANSETRON 8 MG PO TBDP
8.0000 mg | ORAL_TABLET | Freq: Once | ORAL | Status: AC
  Administered 2015-12-25: 8 mg via ORAL
  Filled 2015-12-25: qty 1

## 2015-12-25 MED ORDER — DIPHENOXYLATE-ATROPINE 2.5-0.025 MG PO TABS: 1.0000 | ORAL_TABLET | Freq: Four times a day (QID) | ORAL | Status: DC | PRN

## 2015-12-25 MED ORDER — ONDANSETRON HCL 4 MG PO TABS: 4.0000 mg | ORAL_TABLET | Freq: Three times a day (TID) | ORAL | Status: DC | PRN

## 2015-12-25 NOTE — ED Provider Notes (Signed)
CSN: [id number]     Arrival date & time 12/25/15  0746 History   First MD Initiated Contact with Patient 12/25/15 [phone]     Chief Complaint  Patient presents with  . Emesis    (Consider location/radiation/quality/duration/timing/severity/associated sxs/prior Treatment) Patient is a 75 y.o. female presenting with vomiting. The history is provided by the patient and medical records. No language interpreter was used.  Emesis  75 y/o woman presenting for vomiting ongoing since about 11:30 last night. This has been moderately severe with some brown contents coming up. It has continued since onset with partial improvement since about 6am this morning after she took some zofran. There is accompanying loose, watery diarrhea during this same time. She has not been able to eat or drink fluids since onset. She denies any blood in her stool, any abdominal pain, or any fever or chills. Prior to onset she was feeling at baseline health.  Past Medical History  Diagnosis Date  . Back pain   . GERD (gastroesophageal reflux disease)   . MI (myocardial infarction) ([place])   . Hypercholesteremia   . Anemia   . Cirrhosis ([place])   . Aortic sclerosis ([place]) 07/04/2011  . Aortic insufficiency 07/04/2011    most recent 2D Echo 02/24/2012  EF greater than 55%  . Carotid stenosis, bilateral 07/04/2011    most recent 05/2010 carotid doppler  . Presence of stent in right coronary artery 07/04/2011  . HTN (hypertension) 07/04/2011  . Coronary artery disease   . Myocardial infarction ([place]) 1995  . GERD (gastroesophageal reflux disease)   . Non-alcoholic fatty liver disease   . UTI (lower urinary tract infection)     chronic/recurring/on bactrim  . CVA (cerebral infarction)     03/07/2014  . Hiatal hernia   . IBS (irritable bowel syndrome)   . Diabetes mellitus 2005  . Varices, esophageal ([place])   . Stroke ([place]) 03/07/2014    no deficits from stroke  . OSA (obstructive sleep apnea) 07/04/2011    sleep study 2009   AHI 9.91/hr and during REM sleep 35.58/hr  . DDD (degenerative disc disease), lumbar    Past Surgical History  Procedure Laterality Date  . Back surgery  1985  . Tonsillectomy    . Appendectomy    . Colonoscopy    . Eye surgery  08    cataract surgery of the left eye  . Ercp    . Myringotomy  2012    both ears  . Hardware removal Right 01/17/2013    Procedure: REMOVAL OF HARDWARE AND EXCISION ULNAR STYLOID RIGHT WRIST;  Surgeon: Tennis Must, MD;  Location: [facility];  Service: Orthopedics;  Laterality: Right;  . Wrist surgery      rt wrist hardwear removal  . Esophagogastroduodenoscopy N/A 04/01/2013    Procedure: ESOPHAGOGASTRODUODENOSCOPY (EGD);  Surgeon: Rogene Houston, MD;  [facility];  Service: Endoscopy;  Laterality: N/A;  230-rescheduled to 8:30am Ann notified pt  . Esophageal banding N/A 04/01/2013    Procedure: ESOPHAGEAL BANDING;  Surgeon: Rogene Houston, MD;  [facility];  Service: Endoscopy;  Laterality: N/A;  . Esophagogastroduodenoscopy N/A 05/24/2013    Procedure: ESOPHAGOGASTRODUODENOSCOPY (EGD);  Surgeon: Rogene Houston, MD;  [facility];  Service: Endoscopy;  Laterality: N/A;  730  . Esophageal banding N/A 05/24/2013    Procedure: ESOPHAGEAL BANDING;  Surgeon: Rogene Houston, MD;  [facility];  Service: Endoscopy;  Laterality: N/A;  . Dilation  and curettage of uterus      x2  . Vaginal hysterectomy  1972  . Anal fissure repair  1972  . Epi retinal membrane peel Left   . Esophagogastroduodenoscopy N/A 06/21/2014    Procedure: ESOPHAGOGASTRODUODENOSCOPY (EGD);  Surgeon: Rogene Houston, MD;  [facility];  Service: Endoscopy;  Laterality: N/A;  930-rescheduled 10/14 @ 1200 Ann to notify pt  . Esophageal banding N/A 06/21/2014    Procedure: ESOPHAGEAL BANDING;  Surgeon: Rogene Houston, MD;  [facility];  Service: Endoscopy;  Laterality: N/A;  . Cataract extraction w/phaco Right  07/04/2014    Procedure: CATARACT EXTRACTION PHACO AND INTRAOCULAR LENS PLACEMENT (IOC);  Surgeon: Elta Guadeloupe T. Gershon Crane, MD;  Location: [facility];  Service: Ophthalmology;  Laterality: Right;  CDE:13.13  . Cardiac catheterization  1995.,2006,1997    stent to the proximal RCA after MI    Family History  Problem Relation Age of Onset  . Diabetes Mother   . Heart failure Father   . Heart failure Maternal Aunt    Social History  Substance Use Topics  . Smoking status: Former Smoker -- 0.25 packs/day for 50 years    Types: Cigarettes    Quit date: 01/13/2012  . Smokeless tobacco: Never Used  . Alcohol Use: No   OB History    No data available     Review of Systems  Constitutional: Negative for fatigue.  HENT: Negative for congestion.   Eyes: Negative for redness.  Respiratory: Negative for chest tightness and shortness of breath.   Cardiovascular: Negative for chest pain and leg swelling.  Gastrointestinal: Positive for vomiting. Negative for abdominal distention.  Endocrine: Negative for polydipsia.  Genitourinary: Negative for flank pain.  Skin: Negative for rash.  Psychiatric/Behavioral: The patient is not nervous/anxious.       Allergies  Tape  Home Medications   Prior to Admission medications   Medication Sig Start Date End Date Taking? Authorizing Provider  aspirin EC 81 MG tablet Take 81 mg by mouth daily.   Yes Historical Provider, MD  atorvastatin (LIPITOR) 40 MG tablet Take 40 mg by mouth daily. Reported on 12/17/2015   Yes Historical Provider, MD  carvedilol (COREG) 6.25 MG tablet Take 6.25 mg by mouth 2 (two) times daily.   Yes Historical Provider, MD  doxylamine, Sleep, (UNISOM) 25 MG tablet Take 25 mg by mouth at bedtime as needed for sleep or rhinitis.    Yes Historical Provider, MD  isosorbide mononitrate (IMDUR) 30 MG 24 hr tablet Take 30 mg by mouth daily.   Yes Historical Provider, MD  omeprazole (PRILOSEC OTC) 20 MG tablet Take 20 mg by mouth daily.   Yes  Historical Provider, MD  polyethylene glycol-electrolytes (NULYTELY/GOLYTELY) 420 g solution Take 4,000 mLs by mouth once. 12/18/15  Yes Butch Penny, NP  sulfamethoxazole-trimethoprim (BACTRIM DS,SEPTRA DS) 800-160 MG tablet Take 1 tablet by mouth daily.   Yes Historical Provider, MD  zolpidem (AMBIEN) 10 MG tablet Take 10 mg by mouth at bedtime.   Yes Historical Provider, MD  diphenoxylate-atropine (LOMOTIL) 2.5-0.025 MG tablet Take 1 tablet by mouth 4 (four) times daily as needed for diarrhea or loose stools. 12/25/15   Collier Salina, MD  ondansetron (ZOFRAN) 4 MG tablet Take 1 tablet (4 mg total) by mouth every 8 (eight) hours as needed for nausea or vomiting. 12/25/15   Collier Salina, MD   BP 163/53 mmHg  Pulse 86  Temp(Src) 98.2 F (36.8 C) (Oral)  Resp  14  Ht 5' 3"  (1.6 m)  Wt 68.947 kg  BMI 26.93 kg/m2  SpO2 98% Physical Exam  Constitutional: She is oriented to person, place, and time. She appears well-developed and well-nourished. No distress.  HENT:  Head: Normocephalic.  Mouth/Throat: Oropharynx is clear and moist.  Cardiovascular: Regular rhythm.   No murmur heard. Mildly tachycardic  Pulmonary/Chest: Breath sounds normal. She has no wheezes.  Abdominal: Soft. She exhibits no distension. There is no tenderness. There is no guarding.  Hyperactive bowel sounds  Musculoskeletal: Normal range of motion. She exhibits no edema.  Neurological: She is alert and oriented to person, place, and time.  Skin: Skin is warm and dry.  Psychiatric: She has a normal mood and affect. Thought content normal.    ED Course  Procedures (including critical care time) Labs Review Labs Reviewed  CBC WITH DIFFERENTIAL/PLATELET - Abnormal; Notable for the following:    Platelets 129 (*)    All other components within normal limits  COMPREHENSIVE METABOLIC PANEL - Abnormal; Notable for the following:    Potassium 3.4 (*)    Glucose, Bld 129 (*)    Calcium 8.6 (*)    GFR calc non  Af Amer 57 (*)    All other components within normal limits    Imaging Review No results found. I have personally reviewed and evaluated these images and lab results as part of my medical decision-making.   EKG Interpretation   Date/Time:  Tuesday December 25 2015 08:39:36 EDT Ventricular Rate:  89 PR Interval:  214 QRS Duration: 98 QT Interval:  383 QTC Calculation: 466 R Axis:   -34 Text Interpretation:  Sinus rhythm Borderline prolonged PR interval Left  axis deviation Low voltage, precordial leads Probable anteroseptal  infarct, old No significant change since last tracing Confirmed by  Christy Gentles  MD, DONALD ([id number]) on 12/25/2015 9:15:50 AM      MDM   Final diagnoses:  Non-intractable vomiting with nausea, vomiting of unspecified type   75 y/o woman presenting with acute onset of nausea and vomiting. Metabolic panel and CBC are normal. She does not have significant abdominal pain or distention. Symptoms are improving with zofran ODT while in the ED and she is now tolerating drinking fluids without difficulty. Unclear if this a viral gastroenteritis, preformed toxin ingestion, or other process. She has no systemic symptoms and no recent new abtx exposure. Discharged plan for antiemetic and antidiarrheal agents for supportive care.  Collier Salina, MD 12/25/15 1131  Ripley Fraise, MD 12/25/15 1452

## 2015-12-25 NOTE — ED Provider Notes (Signed)
Patient seen/examined in the Emergency Department in conjunction with Resident Physician Provider Rice Patient reports vomiting/diarrhea Exam : awake/alert, no distress, abd soft without focal tenderness Plan: PO challenge, and then d/c home Pt well appearing at this time    Ripley Fraise, MD 12/25/15 [phone]

## 2015-12-25 NOTE — ED Notes (Signed)
MD at bedside. 

## 2015-12-25 NOTE — ED Notes (Signed)
Pt given water to drink per MD request. Pt tolerated well.

## 2015-12-25 NOTE — ED Notes (Signed)
Pt reports n/v/d that started around midnight. Denies abd pain.

## 2015-12-25 NOTE — Discharge Instructions (Signed)
Prescriptions were refilled for Zofran and Lonox for symptomatic relief of vomiting and diarrhea.  Your symptoms will likely continue to improve over the next 1-2 days with supportive care. The most important other treatment is to remain hydrated by drinking fluids liberally.  If your symptoms become suddenly much worse or develop severe abdominal pain please call us back or return since this could be a sign of a new or worsening problem.

## 2016-01-04 ENCOUNTER — Telehealth (INDEPENDENT_AMBULATORY_CARE_PROVIDER_SITE_OTHER): Payer: Self-pay | Admitting: Internal Medicine

## 2016-01-04 NOTE — Telephone Encounter (Signed)
May take the Carafate. I have spoken with patient

## 2016-01-04 NOTE — Telephone Encounter (Signed)
Patient called and stated that she has been vomiting after every meal for a week and a half.  She stated that she has some medication from 2014 that she was going to start taking, but I could not make out what she was saying.  She wants to know if it is ok for her to take this medication or if she needs a new prescription.  She also wants to know if it is ok for her to take Miralax or Dulcolax, she doesn't have any Dulcolax.  [phone]

## 2016-01-11 DIAGNOSIS — B351 Tinea unguium: Secondary | ICD-10-CM | POA: Diagnosis not present

## 2016-01-11 DIAGNOSIS — E1142 Type 2 diabetes mellitus with diabetic polyneuropathy: Secondary | ICD-10-CM | POA: Diagnosis not present

## 2016-01-22 DIAGNOSIS — H524 Presbyopia: Secondary | ICD-10-CM | POA: Diagnosis not present

## 2016-01-22 DIAGNOSIS — Z961 Presence of intraocular lens: Secondary | ICD-10-CM | POA: Diagnosis not present

## 2016-01-22 DIAGNOSIS — E1140 Type 2 diabetes mellitus with diabetic neuropathy, unspecified: Secondary | ICD-10-CM | POA: Diagnosis not present

## 2016-01-22 DIAGNOSIS — E119 Type 2 diabetes mellitus without complications: Secondary | ICD-10-CM | POA: Diagnosis not present

## 2016-01-22 DIAGNOSIS — H52203 Unspecified astigmatism, bilateral: Secondary | ICD-10-CM | POA: Diagnosis not present

## 2016-01-25 ENCOUNTER — Ambulatory Visit (HOSPITAL_COMMUNITY): Admission: RE | Admit: 2016-01-25 | Payer: Medicare Other | Source: Ambulatory Visit | Admitting: Internal Medicine

## 2016-01-25 ENCOUNTER — Encounter (HOSPITAL_COMMUNITY): Admission: RE | Payer: Self-pay | Source: Ambulatory Visit

## 2016-01-25 SURGERY — COLONOSCOPY
Anesthesia: Moderate Sedation

## 2016-01-30 IMAGING — DX DG FOOT COMPLETE 3+V*L*
3 series · 3 of 3 positions shown · non-contrast
Comparison: None.

CLINICAL DATA: Acute onset of numbness of the foot and ankle
beginning 5 hours ago. No known injury.

EXAM:
LEFT FOOT - COMPLETE 3+ VIEW

[foot ap]
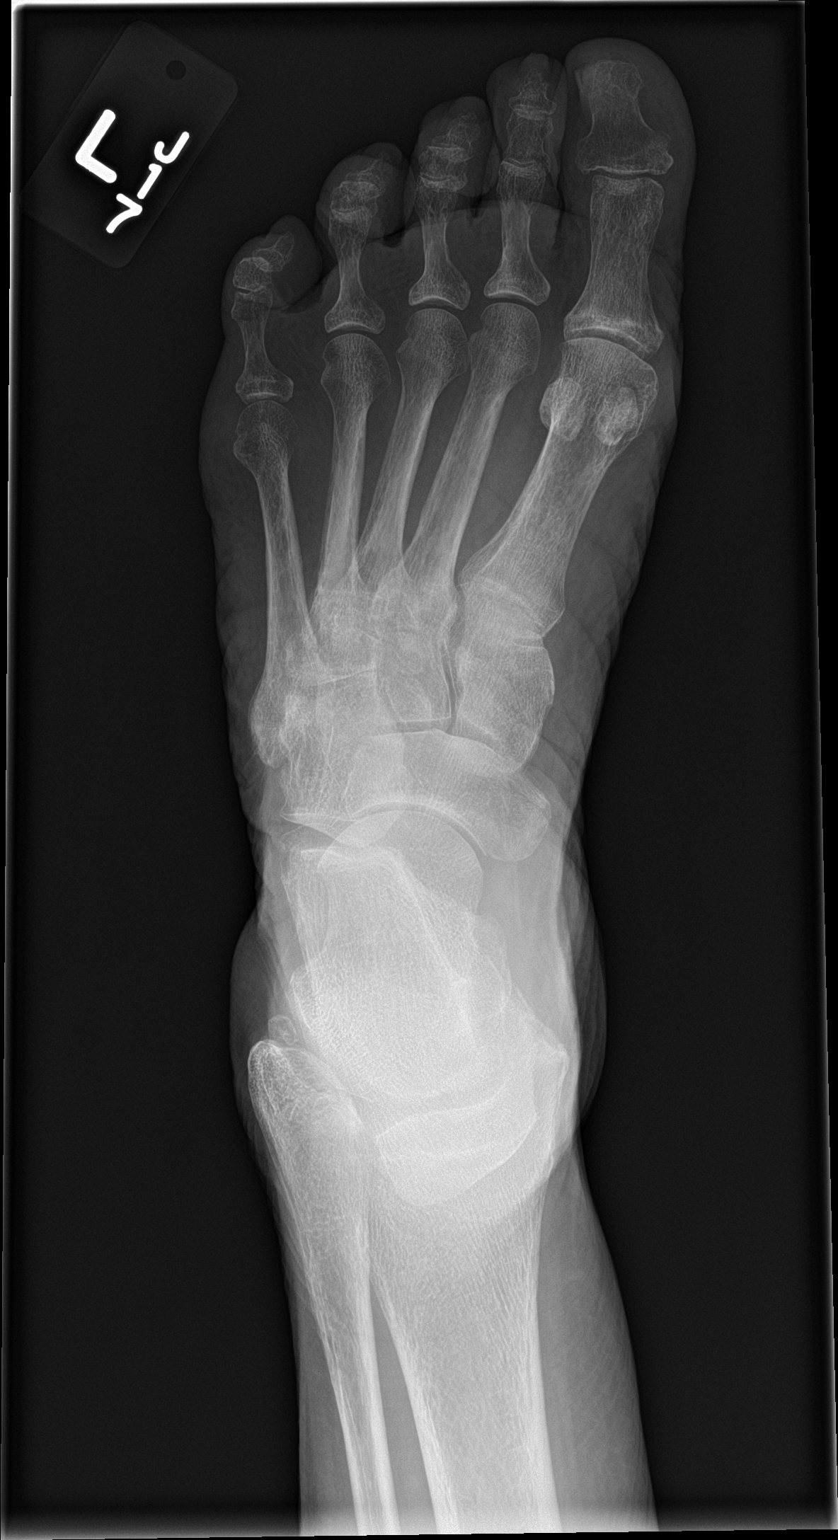

[foot obl]
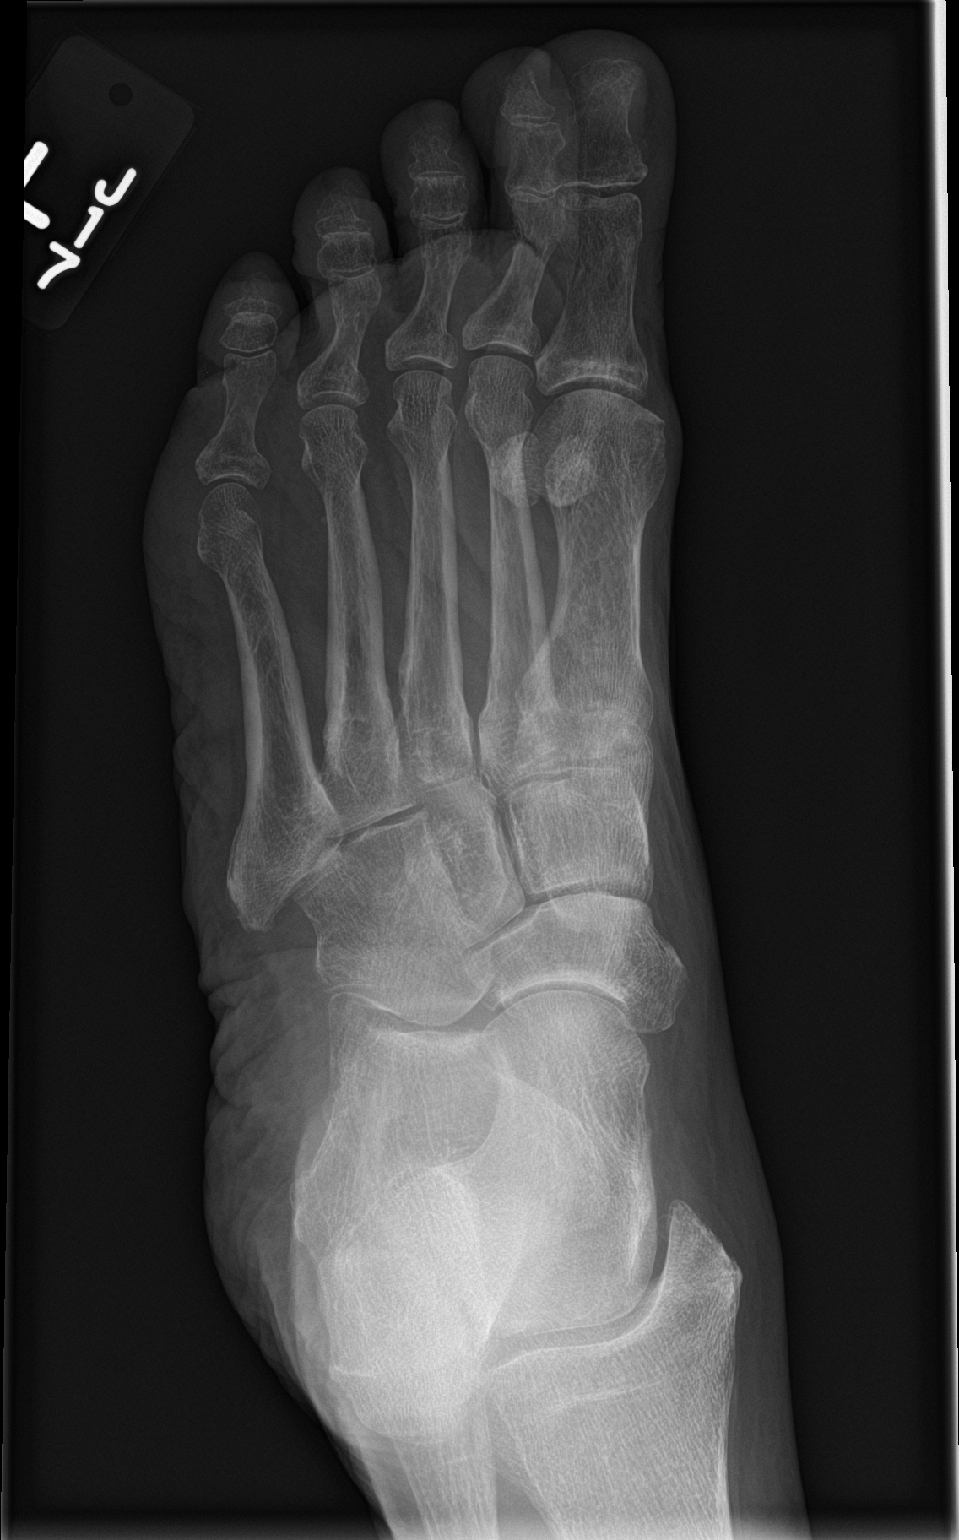

[foot lat]
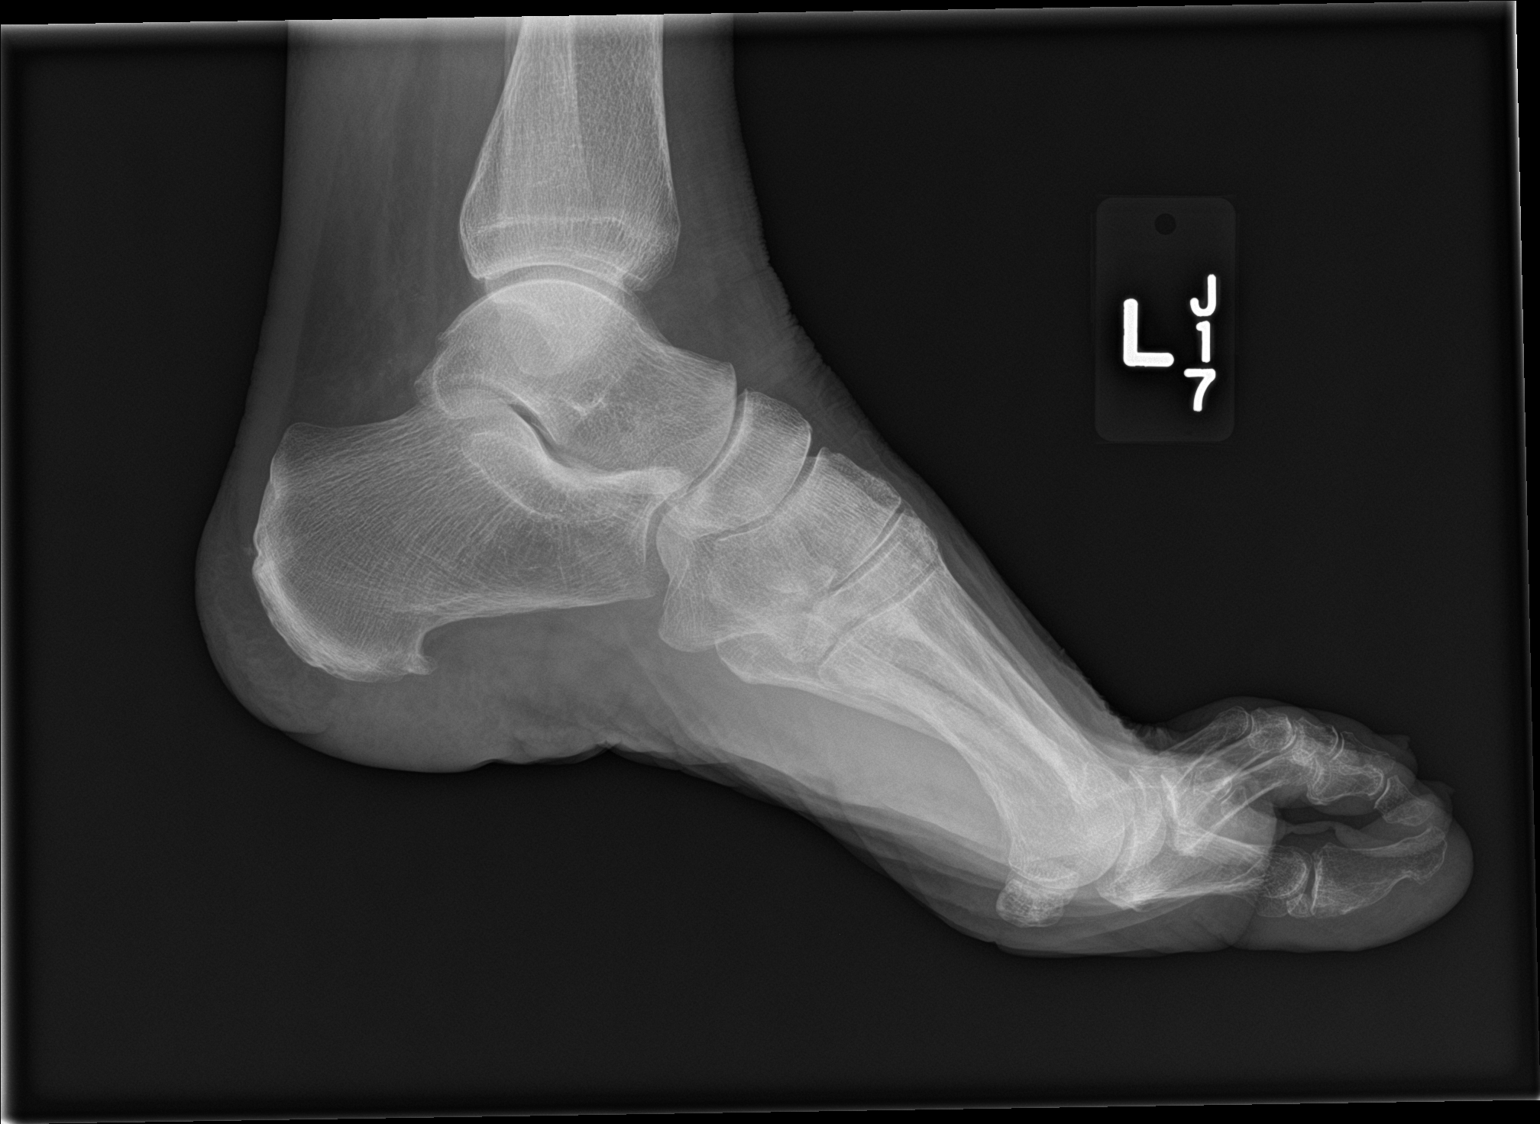

[3 of 3 positions shown; findings below may reference images not displayed]

FINDINGS: There is no evidence of fracture or dislocation. There is no
evidence of arthropathy or other focal bone abnormality. Soft
tissues are unremarkable.
IMPRESSION: Negative.

## 2016-01-30 IMAGING — CT CT ABD-PELV W/ CM
2 of 5 series · 16 of 46 positions shown, 18 images · IV contrast (Omnipaque 300)
Comparison: None.

ADDENDUM:
Not mentioned in the impression is the following:

8 mm right lower lobe pulmonary nodule abutting the diaphragm. If
the patient is at high risk for bronchogenic carcinoma, follow-up
chest CT at 3-6 months is recommended. If the patient is at low risk
for bronchogenic carcinoma, follow-up chest CT at 6-12 months is
recommended. This recommendation follows the consensus statement:
"Guidelines for Management of Small Pulmonary Nodules Detected on CT
Scans: A Statement from the [HOSPITAL]" as published in
CLINICAL DATA: Generalized abdominal pain. Nausea, vomiting,
diarrhea.
EXAM:
CT ABDOMEN AND PELVIS WITH CONTRAST
TECHNIQUE: Multidetector CT imaging of the abdomen and pelvis was performed
using the standard protocol following bolus administration of
intravenous contrast.
CONTRAST:  25mL OMNIPAQUE IOHEXOL 300 MG/ML SOLN, 100mL OMNIPAQUE
IOHEXOL 300 MG/ML SOLN

[Series 2: abd_pel_with 5.0 b40f · axial · 0.74mm/px · z∈[-366,+14]mm · 13 of 86 slices shown, 15 images]
[im 5/86  soft-tissue]
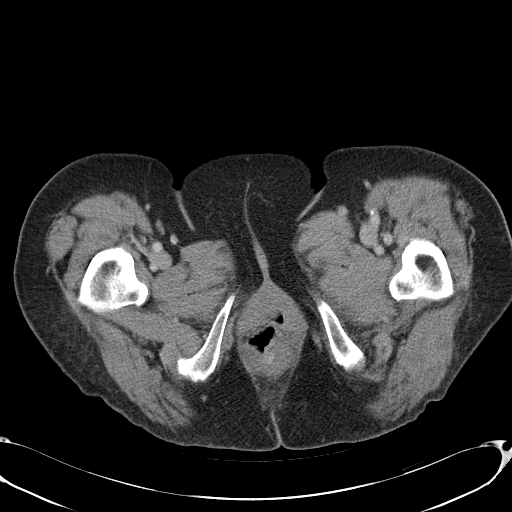
[im 5/86  bone]
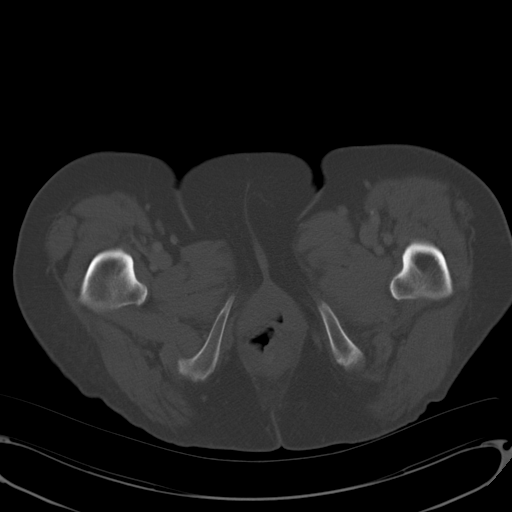
[im 10/86  soft-tissue]
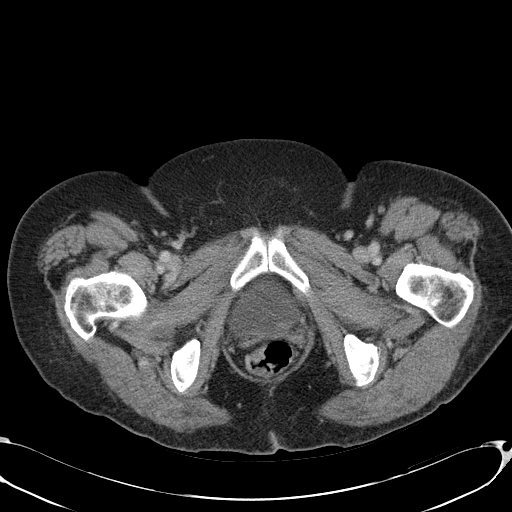
[im 19/86  soft-tissue]
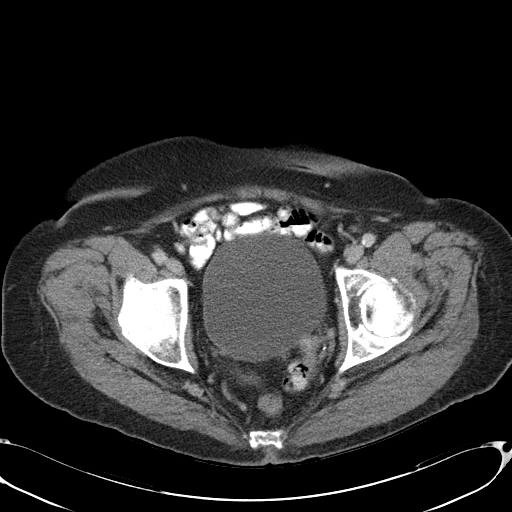
[im 24/86  soft-tissue]
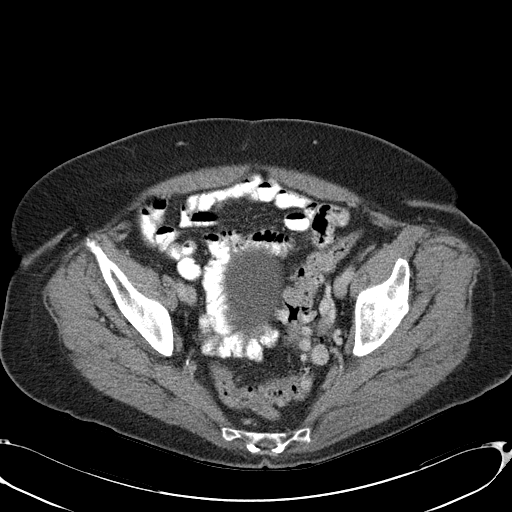
[im 29/86  soft-tissue]
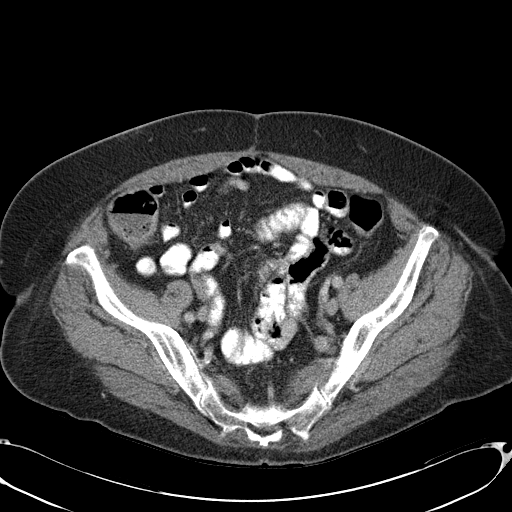
[im 38/86  soft-tissue]
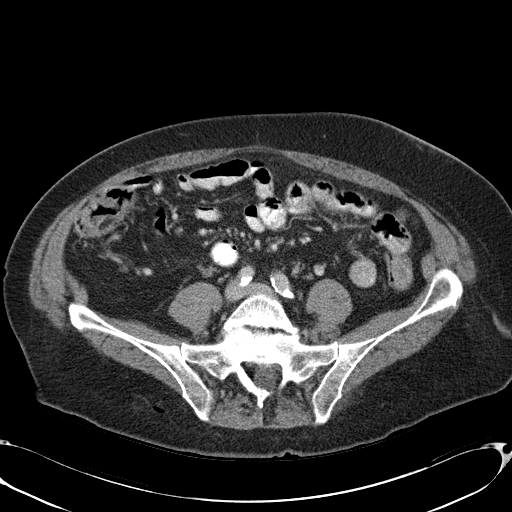
[im 43/86  soft-tissue]
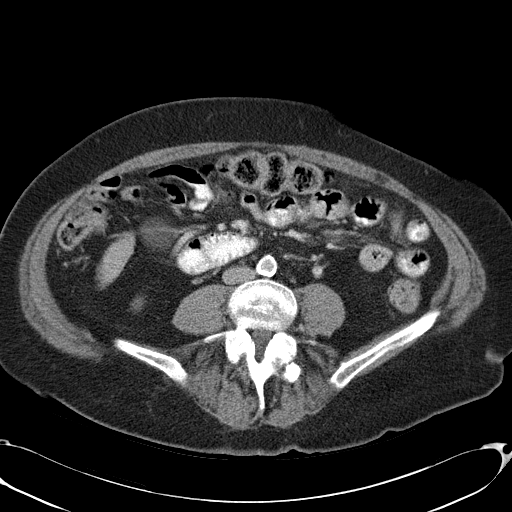
[im 48/86  soft-tissue]
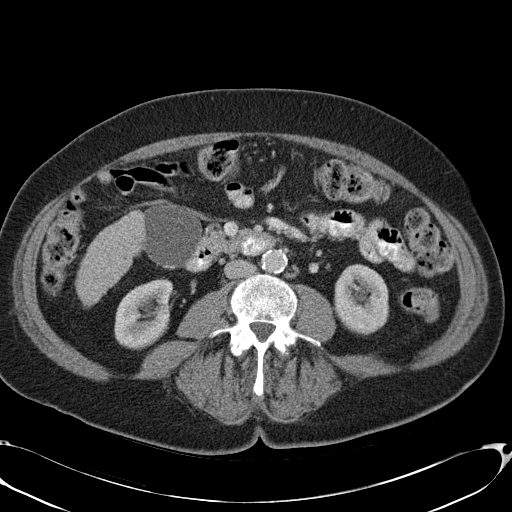
[im 57/86  soft-tissue]
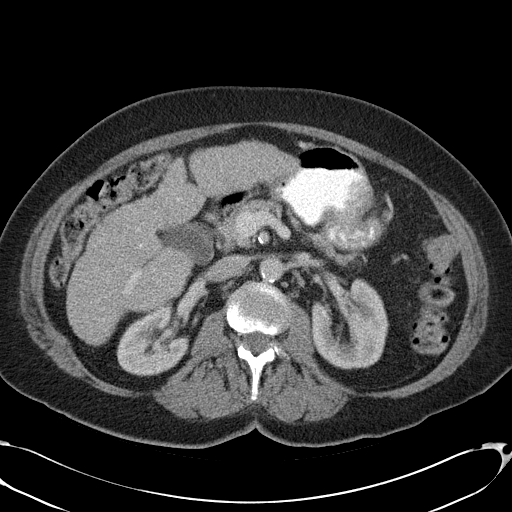
[im 57/86  bone]
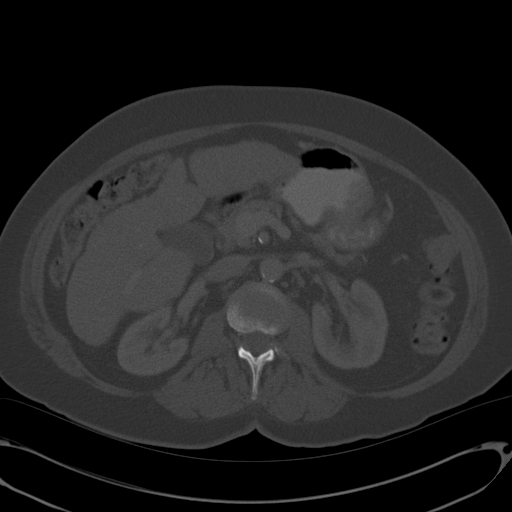
[im 62/86  soft-tissue]
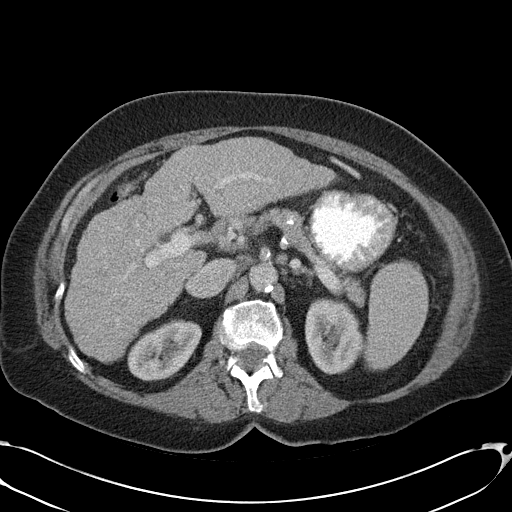
[im 67/86  soft-tissue]
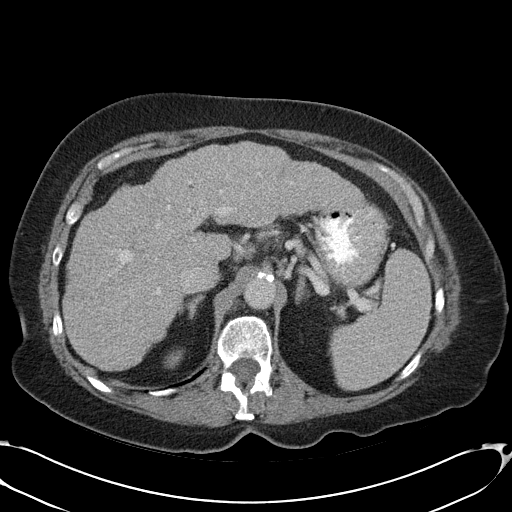
[im 76/86  soft-tissue]
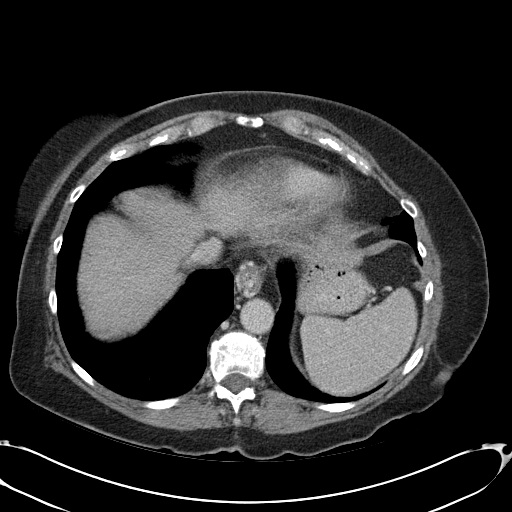
[im 81/86  soft-tissue]
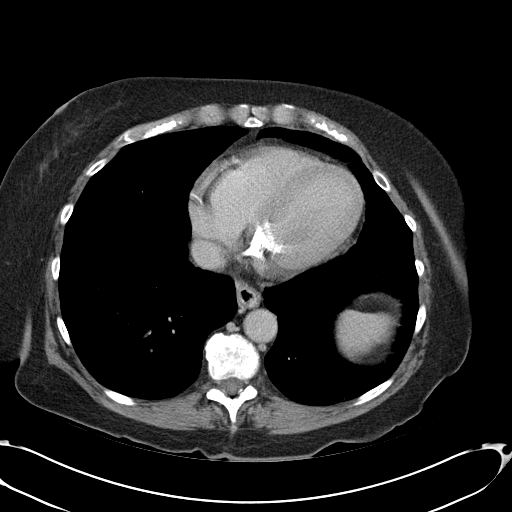

[Series 4: abd_pel_with 3.0 spo cor · coronal · 0.79mm/px · 3 of 79 slices shown]
[im 27/79  soft-tissue]
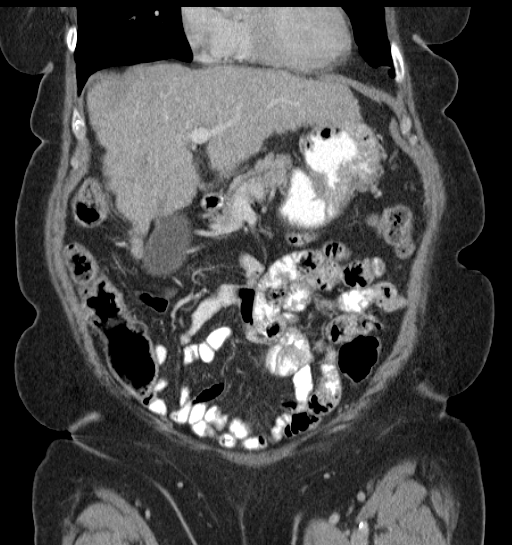
[im 35/79  soft-tissue]
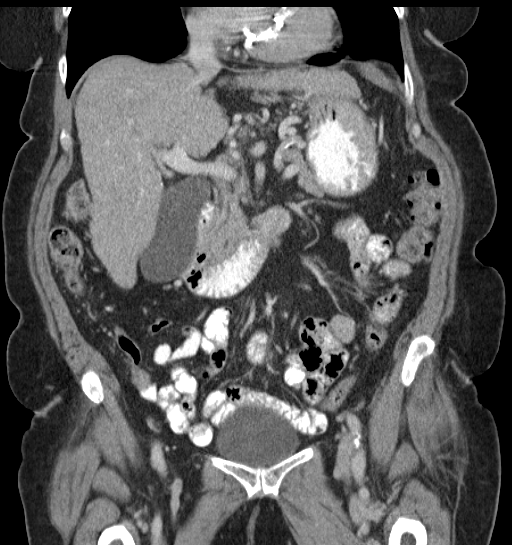
[im 44/79  soft-tissue]
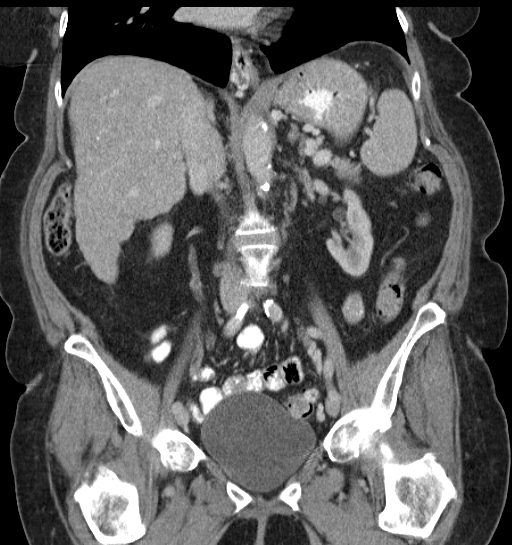

[16 of 46 positions shown; findings below may reference images not displayed]

FINDINGS: Lower chest: 8 mm right lower lobe pulmonary nodule abutting the
diaphragm. Normal heart size. Dense mitral annular calcification.
Coronary artery atherosclerosis in the RCA. Paraesophageal
serpiginous vascular structures are noted most concerning for
varices.

Hepatobiliary: No focal hepatic mass. Nodular contour of the liver
with a diminutive size most concerning for cirrhosis. Tiny
cholelithiasis dependently in the gallbladder which is otherwise
normal.

Pancreas: Normal.

Spleen: No splenic mass. Multiple calcifications within the spleen
likely reflecting sequela of prior granulomatous disease.

Adrenals/Urinary Tract: Normal adrenal glands. 2.1 cm hypodense,
fluid attenuating left interpolar renal mass most consistent with a
cyst. No urolithiasis or obstructive uropathy. No solid renal mass.
Normal bladder.

Stomach/Bowel: No bowel wall thickening or dilatation. No contrast
extravasation. Diverticulosis without evidence of diverticulitis of
the sigmoid colon. No pneumatosis, pneumoperitoneum or portal venous
gas. No abdominal or pelvic free fluid.

Vascular/Lymphatic: Normal caliber abdominal aorta with
atherosclerosis. No abdominal or pelvic lymphadenopathy.

Reproductive: Prior hysterectomy.  No adnexal mass.

Other: No fluid collection or hematoma.

Musculoskeletal: No aggressive lytic or sclerotic osseous lesion.
Bilateral L5 pars interarticularis defects with grade 1
anterolisthesis of L5 on S1. Severe degenerative disc disease with
disc height loss at L5-S1.
IMPRESSION: 1. No acute abdominal pelvic pathology.
2. Diverticulosis without evidence of diverticulitis.
3. Nodular contour of the liver with a diminutive size and with
lower paraesophageal varices most concerning for cirrhosis with
portal hypertension.
4. Grade 1 anterolisthesis of L5 on S1 secondary to bilateral pars
interarticularis defects.

## 2016-01-30 IMAGING — DX DG ANKLE COMPLETE 3+V*L*
3 series · 3 of 3 positions shown · non-contrast
Comparison: 02/24/2012

CLINICAL DATA: Acute onset of numbness of the foot and ankle
beginning 5 hours ago. No known injury.

EXAM:
LEFT ANKLE COMPLETE - 3+ VIEW

[ankle ap]
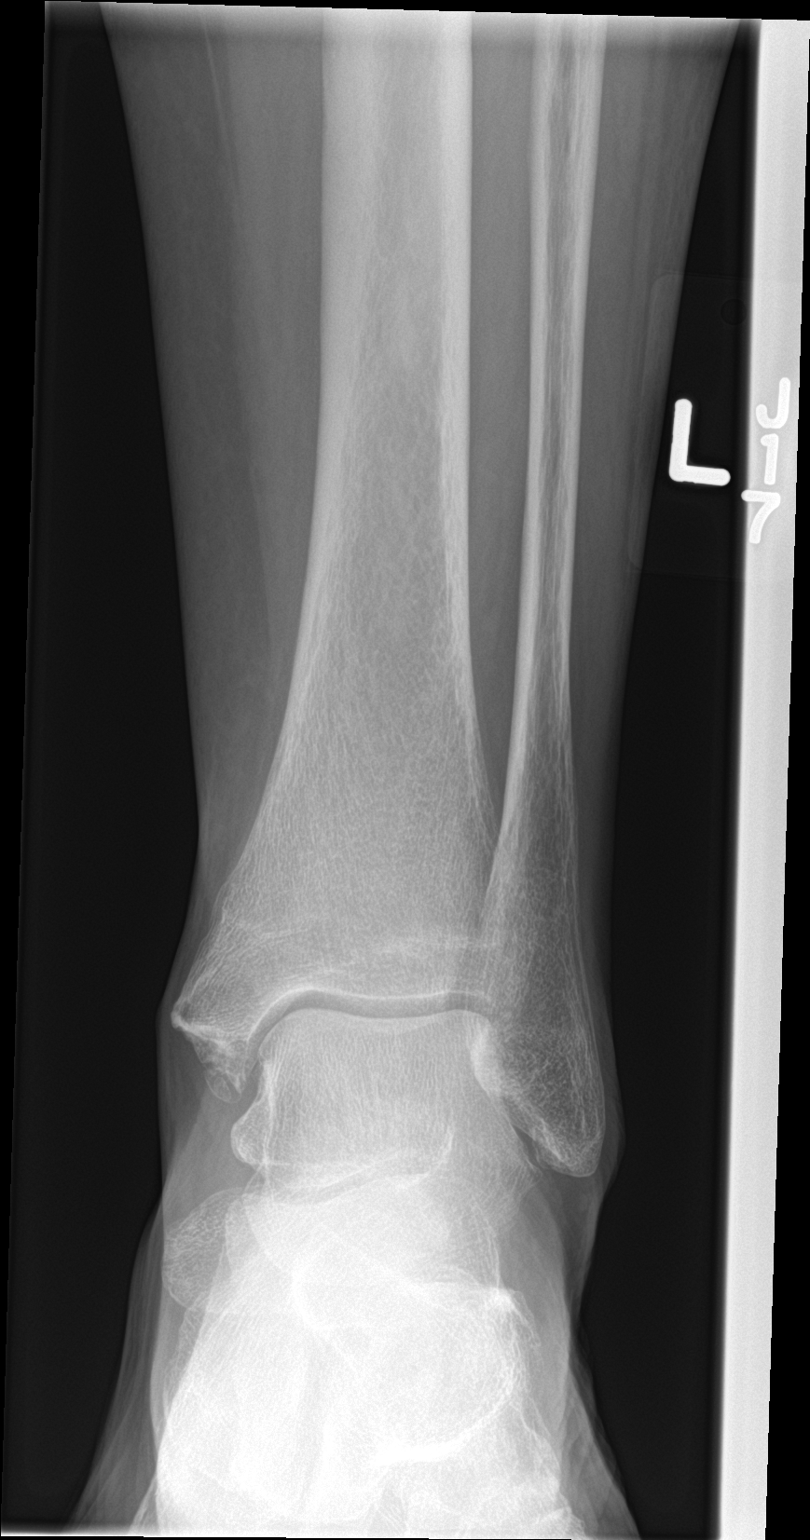

[ankle obl]
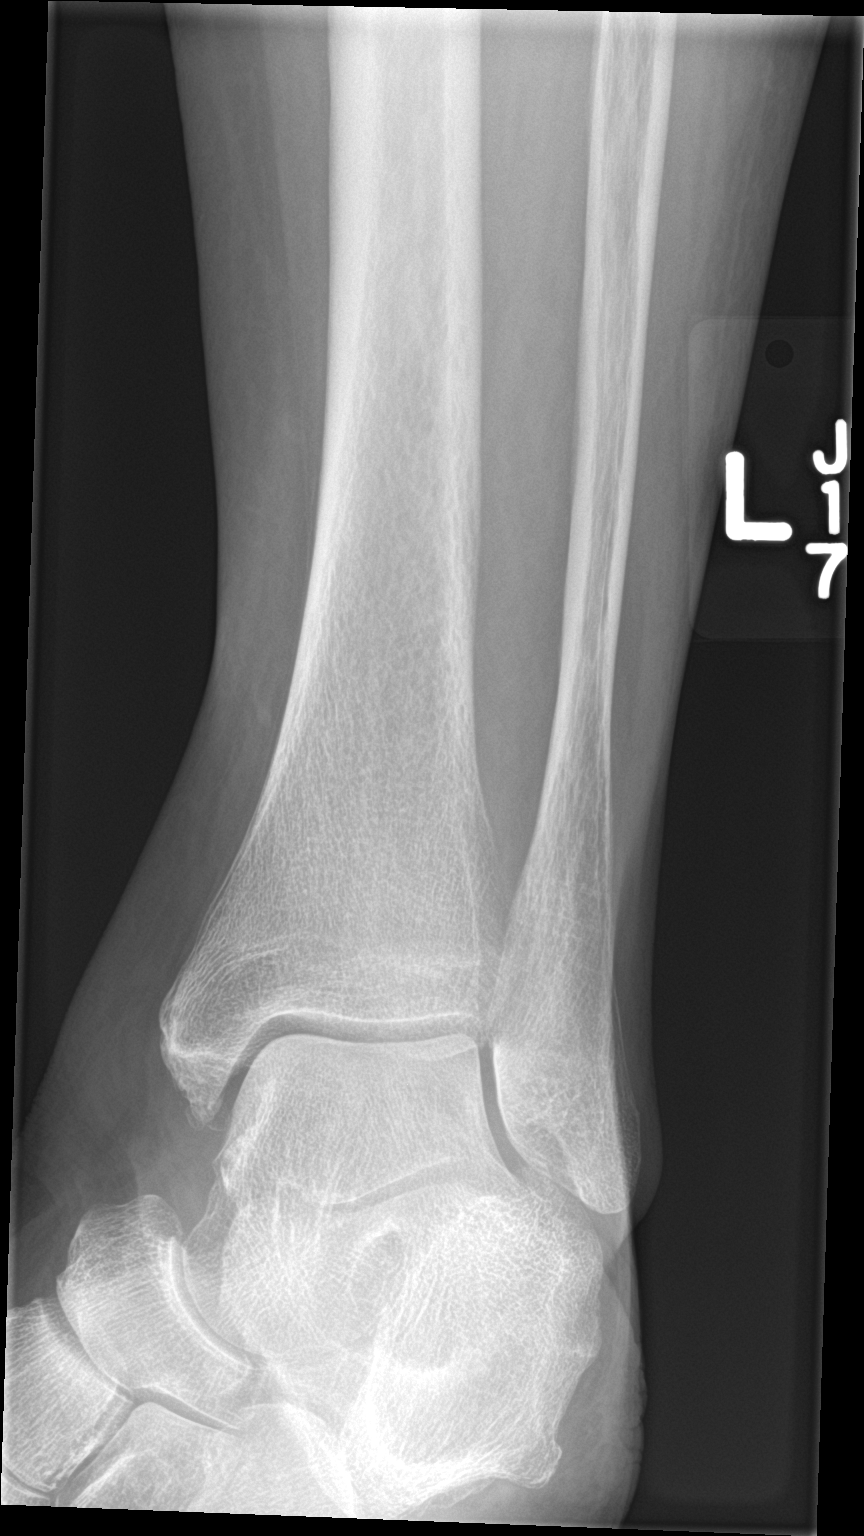

[ankle lat]
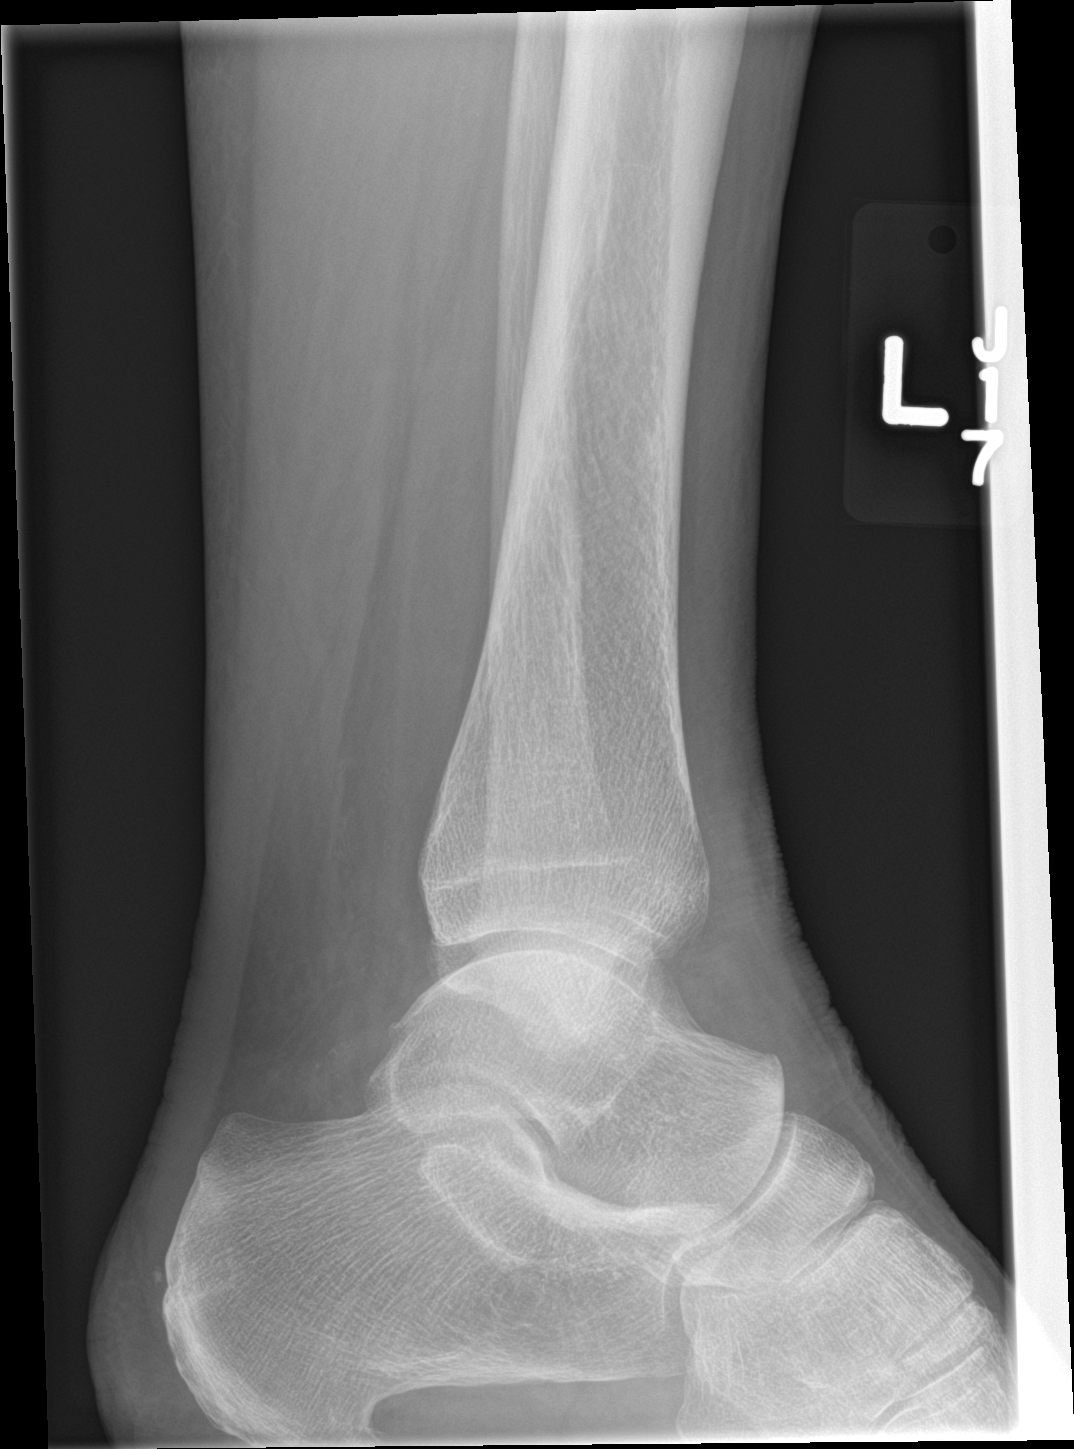

[3 of 3 positions shown; findings below may reference images not displayed]

FINDINGS: There is no evidence of fracture, dislocation, or joint effusion.
There is no evidence of arthropathy or other focal bone abnormality.
Soft tissues are unremarkable.
IMPRESSION: Negative.

## 2016-02-07 ENCOUNTER — Emergency Department (HOSPITAL_COMMUNITY)
Admission: EM | Admit: 2016-02-07 | Discharge: 2016-02-08 | Disposition: A | Discharge status: Home / Self Care | Payer: Medicare Other | Attending: Emergency Medicine | Admitting: Emergency Medicine

## 2016-02-07 ENCOUNTER — Observation Stay (HOSPITAL_COMMUNITY)
Admission: EM | Admit: 2016-02-07 | Discharge: 2016-02-07 | Disposition: A | Discharge status: Home / Self Care | Payer: Medicare Other | Attending: Internal Medicine | Admitting: Internal Medicine

## 2016-02-07 ENCOUNTER — Encounter (HOSPITAL_COMMUNITY): Payer: Self-pay

## 2016-02-07 ENCOUNTER — Emergency Department (HOSPITAL_COMMUNITY): Payer: Medicare Other

## 2016-02-07 ENCOUNTER — Encounter (HOSPITAL_COMMUNITY): Payer: Self-pay | Admitting: Emergency Medicine

## 2016-02-07 DIAGNOSIS — I2510 Atherosclerotic heart disease of native coronary artery without angina pectoris: Secondary | ICD-10-CM | POA: Insufficient documentation

## 2016-02-07 DIAGNOSIS — Z87891 Personal history of nicotine dependence: Secondary | ICD-10-CM | POA: Insufficient documentation

## 2016-02-07 DIAGNOSIS — Z955 Presence of coronary angioplasty implant and graft: Secondary | ICD-10-CM | POA: Diagnosis not present

## 2016-02-07 DIAGNOSIS — E7800 Pure hypercholesterolemia, unspecified: Secondary | ICD-10-CM | POA: Insufficient documentation

## 2016-02-07 DIAGNOSIS — R61 Generalized hyperhidrosis: Secondary | ICD-10-CM | POA: Diagnosis not present

## 2016-02-07 DIAGNOSIS — Z7982 Long term (current) use of aspirin: Secondary | ICD-10-CM | POA: Diagnosis not present

## 2016-02-07 DIAGNOSIS — R197 Diarrhea, unspecified: Secondary | ICD-10-CM | POA: Insufficient documentation

## 2016-02-07 DIAGNOSIS — I252 Old myocardial infarction: Secondary | ICD-10-CM | POA: Insufficient documentation

## 2016-02-07 DIAGNOSIS — I639 Cerebral infarction, unspecified: Secondary | ICD-10-CM | POA: Diagnosis not present

## 2016-02-07 DIAGNOSIS — E119 Type 2 diabetes mellitus without complications: Secondary | ICD-10-CM | POA: Insufficient documentation

## 2016-02-07 DIAGNOSIS — Z79899 Other long term (current) drug therapy: Secondary | ICD-10-CM | POA: Diagnosis not present

## 2016-02-07 DIAGNOSIS — K219 Gastro-esophageal reflux disease without esophagitis: Secondary | ICD-10-CM | POA: Diagnosis not present

## 2016-02-07 DIAGNOSIS — I209 Angina pectoris, unspecified: Secondary | ICD-10-CM | POA: Diagnosis not present

## 2016-02-07 DIAGNOSIS — G4733 Obstructive sleep apnea (adult) (pediatric): Secondary | ICD-10-CM | POA: Insufficient documentation

## 2016-02-07 DIAGNOSIS — R112 Nausea with vomiting, unspecified: Secondary | ICD-10-CM | POA: Diagnosis not present

## 2016-02-07 DIAGNOSIS — Z8673 Personal history of transient ischemic attack (TIA), and cerebral infarction without residual deficits: Secondary | ICD-10-CM | POA: Diagnosis not present

## 2016-02-07 DIAGNOSIS — R110 Nausea: Secondary | ICD-10-CM | POA: Insufficient documentation

## 2016-02-07 DIAGNOSIS — I249 Acute ischemic heart disease, unspecified: Secondary | ICD-10-CM | POA: Diagnosis present

## 2016-02-07 DIAGNOSIS — I10 Essential (primary) hypertension: Secondary | ICD-10-CM | POA: Insufficient documentation

## 2016-02-07 DIAGNOSIS — R079 Chest pain, unspecified: Principal | ICD-10-CM | POA: Insufficient documentation

## 2016-02-07 LAB — CBC WITH DIFFERENTIAL/PLATELET
BASOS PCT: 0 %
Basophils Absolute: 0 10*3/uL (ref 0.0–0.1)
EOS ABS: 0.2 10*3/uL (ref 0.0–0.7)
EOS PCT: 3 %
HCT: 36.1 % (ref 36.0–46.0)
HEMOGLOBIN: 11.9 g/dL — AB (ref 12.0–15.0)
LYMPHS ABS: 1.4 10*3/uL (ref 0.7–4.0)
Lymphocytes Relative: 25 %
MCH: 30.1 pg (ref 26.0–34.0)
MCHC: 33 g/dL (ref 30.0–36.0)
MCV: 91.4 fL (ref 78.0–100.0)
MONOS PCT: 9 %
Monocytes Absolute: 0.5 10*3/uL (ref 0.1–1.0)
NEUTROS PCT: 63 %
Neutro Abs: 3.5 10*3/uL (ref 1.7–7.7)
PLATELETS: 143 10*3/uL — AB (ref 150–400)
RBC: 3.95 MIL/uL (ref 3.87–5.11)
RDW: 13.2 % (ref 11.5–15.5)
WBC: 5.6 10*3/uL (ref 4.0–10.5)

## 2016-02-07 LAB — TROPONIN I

## 2016-02-07 LAB — BASIC METABOLIC PANEL
Anion gap: 7 (ref 5–15)
BUN: 13 mg/dL (ref 6–20)
CALCIUM: 8.9 mg/dL (ref 8.9–10.3)
CHLORIDE: 111 mmol/L (ref 101–111)
CO2: 23 mmol/L (ref 22–32)
CREATININE: 0.88 mg/dL (ref 0.44–1.00)
GFR calc non Af Amer: >60 mL/min (ref >60)
Glucose, Bld: 121 mg/dL — ABNORMAL HIGH (ref 65–99)
Potassium: 3.3 mmol/L — ABNORMAL LOW (ref 3.5–5.1)
SODIUM: 141 mmol/L (ref 135–145)

## 2016-02-07 MED ORDER — NITROGLYCERIN 0.4 MG SL SUBL
0.4000 mg | SUBLINGUAL_TABLET | SUBLINGUAL | Status: DC | PRN
  Administered 2016-02-07: 0.4 mg via SUBLINGUAL
  Filled 2016-02-07: qty 1

## 2016-02-07 MED ORDER — ONDANSETRON 4 MG PO TBDP
ORAL_TABLET | ORAL | Status: AC
  Filled 2016-02-07: qty 1

## 2016-02-07 MED ORDER — ONDANSETRON 4 MG PO TBDP
4.0000 mg | ORAL_TABLET | Freq: Once | ORAL | Status: AC | PRN
  Administered 2016-02-07: 4 mg via ORAL

## 2016-02-07 MED ORDER — ASPIRIN 81 MG PO CHEW
162.0000 mg | CHEWABLE_TABLET | Freq: Once | ORAL | Status: AC
  Administered 2016-02-07: 162 mg via ORAL
  Filled 2016-02-07: qty 2

## 2016-02-07 MED ORDER — ONDANSETRON HCL 4 MG/2ML IJ SOLN
INTRAMUSCULAR | Status: AC
  Filled 2016-02-07: qty 2

## 2016-02-07 MED ORDER — ONDANSETRON HCL 4 MG/2ML IJ SOLN
4.0000 mg | Freq: Once | INTRAMUSCULAR | Status: AC
  Administered 2016-02-07: 4 mg via INTRAVENOUS

## 2016-02-07 NOTE — ED Notes (Signed)
Pt up ambulatory to bathroom without difficulty.  Pt c/o nausea and "dry heaves".  Orders for zofran received.  Pt denies any pain at present time.

## 2016-02-07 NOTE — ED Notes (Signed)
Pt states she awoke with central chest pressure with some nausea and diaphoresis.  Pt states she took a zofran for the nausea, but has not felt any better since then.  Pt states she had 2 stents placed a year ago.

## 2016-02-07 NOTE — ED Notes (Signed)
Patient complaining of vomiting since yesterday. States "I was here earlier and they wanted to admit me but I didn't want to stay." States she took two zofran since 2000 with no relief. Denies pain at this time.

## 2016-02-07 NOTE — ED Notes (Signed)
Pt up since arrival times 4 .  States the last 3 bowel movements have been loose.  Pt takes septra daily.  Advised pt that if she goes again we will get a stool specimen.  Pt denies any pain at this time.

## 2016-02-07 NOTE — ED Notes (Signed)
Pt states she wants to be discharged.  Called Dr. Jerilee Hoh and notified of pt wanting to sign out.

## 2016-02-07 NOTE — ED Notes (Signed)
Pt states she is leaving.  Advised pt that she needed to stay for 24 hour observation for chest pain.  Pt states she is signing out AMA.  Paged hospitalist.

## 2016-02-07 NOTE — ED Notes (Signed)
Seen here yesterday for same - continues to have vomiting- vomited four to five times since 8 pm

## 2016-02-07 NOTE — ED Notes (Signed)
Tried to talk pt into staying.  Pt states she is fine and she wants to go.  Pt signed AMA paper work.

## 2016-02-07 NOTE — ED Notes (Signed)
Pt ambulated to restroom with steady and even gait. 

## 2016-02-07 NOTE — ED Provider Notes (Signed)
CSN: [id number]     Arrival date & time 02/07/16  0530 History   First MD Initiated Contact with Patient 02/07/16 [phone]     Chief Complaint  Patient presents with  . Chest Pain    Patient is a 75 y.o. female presenting with chest pain. The history is provided by the patient.  Chest Pain Pain location:  Substernal area Pain quality: pressure   Pain radiates to:  Does not radiate Pain severity:  Moderate Onset quality:  Gradual Duration:  3 hours Timing:  Constant Progression:  Unchanged Chronicity:  New Relieved by:  Nothing Worsened by:  Nothing tried Associated symptoms: diaphoresis and nausea   Associated symptoms: no shortness of breath and not vomiting    Patient presents for onset of chest pressure with nausea and diaphoresis She has not had this pain recently She denies pleuritic pain She took two ASA at home with minimal relief Past Medical History  Diagnosis Date  . Back pain   . GERD (gastroesophageal reflux disease)   . MI (myocardial infarction) ([place])   . Hypercholesteremia   . Anemia   . Cirrhosis ([place])   . Aortic sclerosis ([place]) 07/04/2011  . Aortic insufficiency 07/04/2011    most recent 2D Echo 02/24/2012  EF greater than 55%  . Carotid stenosis, bilateral 07/04/2011    most recent 05/2010 carotid doppler  . Presence of stent in right coronary artery 07/04/2011  . HTN (hypertension) 07/04/2011  . Coronary artery disease   . Myocardial infarction ([place]) 1995  . GERD (gastroesophageal reflux disease)   . Non-alcoholic fatty liver disease   . UTI (lower urinary tract infection)     chronic/recurring/on bactrim  . CVA (cerebral infarction)     03/07/2014  . Hiatal hernia   . IBS (irritable bowel syndrome)   . Diabetes mellitus 2005  . Varices, esophageal ([place])   . Stroke ([place]) 03/07/2014    no deficits from stroke  . OSA (obstructive sleep apnea) 07/04/2011    sleep study 2009  AHI 9.91/hr and during REM sleep 35.58/hr  . DDD (degenerative disc  disease), lumbar    Past Surgical History  Procedure Laterality Date  . Back surgery  1985  . Tonsillectomy    . Appendectomy    . Colonoscopy    . Eye surgery  08    cataract surgery of the left eye  . Ercp    . Myringotomy  2012    both ears  . Hardware removal Right 01/17/2013    Procedure: REMOVAL OF HARDWARE AND EXCISION ULNAR STYLOID RIGHT WRIST;  Surgeon: Tennis Must, MD;  Location: [facility];  Service: Orthopedics;  Laterality: Right;  . Wrist surgery      rt wrist hardwear removal  . Esophagogastroduodenoscopy N/A 04/01/2013    Procedure: ESOPHAGOGASTRODUODENOSCOPY (EGD);  Surgeon: Rogene Houston, MD;  [facility];  Service: Endoscopy;  Laterality: N/A;  230-rescheduled to 8:30am Ann notified pt  . Esophageal banding N/A 04/01/2013    Procedure: ESOPHAGEAL BANDING;  Surgeon: Rogene Houston, MD;  [facility];  Service: Endoscopy;  Laterality: N/A;  . Esophagogastroduodenoscopy N/A 05/24/2013    Procedure: ESOPHAGOGASTRODUODENOSCOPY (EGD);  Surgeon: Rogene Houston, MD;  [facility];  Service: Endoscopy;  Laterality: N/A;  730  . Esophageal banding N/A 05/24/2013    Procedure: ESOPHAGEAL BANDING;  Surgeon: Rogene Houston, MD;  [facility];  Service: Endoscopy;  Laterality: N/A;  . Dilation and  curettage of uterus      x2  . Vaginal hysterectomy  1972  . Anal fissure repair  1972  . Epi retinal membrane peel Left   . Esophagogastroduodenoscopy N/A 06/21/2014    Procedure: ESOPHAGOGASTRODUODENOSCOPY (EGD);  Surgeon: Rogene Houston, MD;  [facility];  Service: Endoscopy;  Laterality: N/A;  930-rescheduled 10/14 @ 1200 Ann to notify pt  . Esophageal banding N/A 06/21/2014    Procedure: ESOPHAGEAL BANDING;  Surgeon: Rogene Houston, MD;  [facility];  Service: Endoscopy;  Laterality: N/A;  . Cataract extraction w/phaco Right 07/04/2014    Procedure: CATARACT EXTRACTION PHACO AND INTRAOCULAR  LENS PLACEMENT (IOC);  Surgeon: Elta Guadeloupe T. Gershon Crane, MD;  [facility];  Service: Ophthalmology;  Laterality: Right;  CDE:13.13  . Cardiac catheterization  1995.,2006,1997    stent to the proximal RCA after MI    Family History  Problem Relation Age of Onset  . Diabetes Mother   . Heart failure Father   . Heart failure Maternal Aunt    Social History  Substance Use Topics  . Smoking status: Former Smoker -- 0.25 packs/day for 50 years    Types: Cigarettes    Quit date: 01/13/2012  . Smokeless tobacco: Never Used  . Alcohol Use: No   OB History    No data available     Review of Systems  Constitutional: Positive for diaphoresis.  Respiratory: Negative for shortness of breath.   Cardiovascular: Positive for chest pain.  Gastrointestinal: Positive for nausea. Negative for vomiting.  All other systems reviewed and are negative.     Allergies  Tape  Home Medications   Prior to Admission medications   Medication Sig Start Date End Date Taking? Authorizing Provider  aspirin EC 81 MG tablet Take 81 mg by mouth daily.   Yes Historical Provider, MD  carvedilol (COREG) 6.25 MG tablet Take 6.25 mg by mouth 2 (two) times daily.   Yes Historical Provider, MD  doxylamine, Sleep, (UNISOM) 25 MG tablet Take 25 mg by mouth at bedtime as needed for sleep or rhinitis.    Yes Historical Provider, MD  isosorbide mononitrate (IMDUR) 30 MG 24 hr tablet Take 30 mg by mouth daily.   Yes Historical Provider, MD  omeprazole (PRILOSEC OTC) 20 MG tablet Take 20 mg by mouth daily.   Yes Historical Provider, MD  ondansetron (ZOFRAN) 4 MG tablet Take 1 tablet (4 mg total) by mouth every 8 (eight) hours as needed for nausea or vomiting. 12/25/15  Yes Collier Salina, MD  rosuvastatin (CRESTOR) 10 MG tablet Take 10 mg by mouth daily.   Yes Historical Provider, MD  sulfamethoxazole-trimethoprim (BACTRIM DS,SEPTRA DS) 800-160 MG tablet Take 1 tablet by mouth daily.   Yes Historical Provider, MD  zolpidem  (AMBIEN) 10 MG tablet Take 10 mg by mouth at bedtime.   Yes Historical Provider, MD  atorvastatin (LIPITOR) 40 MG tablet Take 40 mg by mouth daily. Reported on 12/17/2015    Historical Provider, MD  polyethylene glycol-electrolytes (NULYTELY/GOLYTELY) 420 g solution Take 4,000 mLs by mouth once. 12/18/15   Terri L Setzer, NP   BP 146/60 mmHg  Pulse 76  Temp(Src) 98.5 F (36.9 C) (Oral)  Resp 15  Ht 5' 3"  (1.6 m)  Wt 72.576 kg  BMI 28.35 kg/m2  SpO2 98% Physical Exam CONSTITUTIONAL: Elderly, ill appearing HEAD: Normocephalic/atraumatic EYES: EOMI ENMT: Mucous membranes moist NECK: supple no meningeal signs SPINE/BACK:entire spine nontender CV: S1/S2 noted, no murmurs/rubs/gallops noted LUNGS:  Lungs are clear to auscultation bilaterally, no apparent distress ABDOMEN: soft, nontender NEURO: Pt is awake/alert/appropriate, moves all extremitiesx4.  No facial droop.   EXTREMITIES: pulses normal/equal, full ROM, no calf tenderness SKIN: warm, color normal PSYCH: no abnormalities of mood noted, alert and oriented to situation  ED Course  Procedures  Medications  nitroGLYCERIN (NITROSTAT) SL tablet 0.4 mg (0.4 mg Sublingual Given 02/07/16 0602)  aspirin chewable tablet 162 mg (162 mg Oral Given 02/07/16 0602)    6:12 AM Pt medically complex with h/o CAD who presents with CP/nausea/diaphoresis Will need cardiac workup Will follow closely 7:10 AM CP resolved Pt feels improved Will admit patient for further evaluation of CP in the setting of known CAD I doubt acute PE/Dissection currently D/w dr Jerilee Hoh for admission  Labs Review Labs Reviewed  BASIC METABOLIC PANEL - Abnormal; Notable for the following:    Potassium 3.3 (*)    Glucose, Bld 121 (*)    All other components within normal limits  CBC WITH DIFFERENTIAL/PLATELET - Abnormal; Notable for the following:    Hemoglobin 11.9 (*)    Platelets 143 (*)    All other components within normal limits  TROPONIN I    Imaging  Review Dg Chest Portable 1 View  02/07/2016  CLINICAL DATA:  Central chest pressure with nausea and diaphoresis. EXAM: PORTABLE CHEST 1 VIEW COMPARISON:  06/01/2015 FINDINGS: The cardiomediastinal contours are normal. Pulmonary vasculature is normal. No consolidation, pleural effusion, or pneumothorax. No acute osseous abnormalities are seen. IMPRESSION: No acute pulmonary process. Electronically Signed   By: Jeb Levering M.D.   On: 02/07/2016 06:16   I have personally reviewed and evaluated these images and lab results as part of my medical decision-making.   EKG Interpretation   Date/Time:  Thursday February 07 2016 05:41:28 EDT Ventricular Rate:  80 PR Interval:  203 QRS Duration: 93 QT Interval:  399 QTC Calculation: 460 R Axis:   -19 Text Interpretation:  Sinus rhythm Borderline left axis deviation  Anteroseptal infarct, old No significant change since last tracing  Confirmed by Christy Gentles  MD, Sherree Shankman ([id number]) on 02/07/2016 5:47:48 AM      MDM   Final diagnoses:  Ischemic chest pain [facility])    Nursing notes including past medical history and social history reviewed and considered in documentation xrays/imaging reviewed by myself and considered during evaluation Labs/vital reviewed myself and considered during evaluation     Ripley Fraise, MD 02/07/16 [phone]

## 2016-02-08 ENCOUNTER — Telehealth (INDEPENDENT_AMBULATORY_CARE_PROVIDER_SITE_OTHER): Payer: Self-pay | Admitting: Internal Medicine

## 2016-02-08 DIAGNOSIS — R112 Nausea with vomiting, unspecified: Secondary | ICD-10-CM | POA: Diagnosis not present

## 2016-02-08 LAB — COMPREHENSIVE METABOLIC PANEL
ALBUMIN: 4 g/dL (ref 3.5–5.0)
ALK PHOS: 101 U/L (ref 38–126)
ALT: 26 U/L (ref 14–54)
AST: 28 U/L (ref 15–41)
Anion gap: 7 (ref 5–15)
BUN: 10 mg/dL (ref 6–20)
CALCIUM: 8.8 mg/dL — AB (ref 8.9–10.3)
CHLORIDE: 109 mmol/L (ref 101–111)
CO2: 26 mmol/L (ref 22–32)
CREATININE: 0.81 mg/dL (ref 0.44–1.00)
GFR calc Af Amer: >60 mL/min (ref >60)
GFR calc non Af Amer: >60 mL/min (ref >60)
GLUCOSE: 137 mg/dL — AB (ref 65–99)
Potassium: 3.7 mmol/L (ref 3.5–5.1)
SODIUM: 142 mmol/L (ref 135–145)
Total Bilirubin: 0.6 mg/dL (ref 0.3–1.2)
Total Protein: 6.9 g/dL (ref 6.5–8.1)

## 2016-02-08 LAB — URINALYSIS, ROUTINE W REFLEX MICROSCOPIC
BILIRUBIN URINE: NEGATIVE
GLUCOSE, UA: NEGATIVE mg/dL
HGB URINE DIPSTICK: NEGATIVE
Ketones, ur: NEGATIVE mg/dL
Leukocytes, UA: NEGATIVE
Nitrite: NEGATIVE
Specific Gravity, Urine: 1.025 (ref 1.005–1.030)
pH: 5.5 (ref 5.0–8.0)

## 2016-02-08 LAB — CBC
HCT: 38.7 % (ref 36.0–46.0)
HEMOGLOBIN: 12.8 g/dL (ref 12.0–15.0)
MCH: 30 pg (ref 26.0–34.0)
MCHC: 33.1 g/dL (ref 30.0–36.0)
MCV: 90.8 fL (ref 78.0–100.0)
Platelets: 160 10*3/uL (ref 150–400)
RBC: 4.26 MIL/uL (ref 3.87–5.11)
RDW: 13.3 % (ref 11.5–15.5)
WBC: 7.8 10*3/uL (ref 4.0–10.5)

## 2016-02-08 LAB — URINE MICROSCOPIC-ADD ON

## 2016-02-08 LAB — TROPONIN I

## 2016-02-08 LAB — LIPASE, BLOOD: Lipase: 23 U/L (ref 11–51)

## 2016-02-08 MED ORDER — METOCLOPRAMIDE HCL 5 MG/ML IJ SOLN
5.0000 mg | Freq: Once | INTRAMUSCULAR | Status: AC
  Administered 2016-02-08: 5 mg via INTRAVENOUS
  Filled 2016-02-08: qty 2

## 2016-02-08 MED ORDER — LOPERAMIDE HCL 2 MG PO CAPS
2.0000 mg | ORAL_CAPSULE | Freq: Once | ORAL | Status: AC
  Administered 2016-02-08: 2 mg via ORAL
  Filled 2016-02-08: qty 1

## 2016-02-08 MED ORDER — SODIUM CHLORIDE 0.9 % IV BOLUS (SEPSIS): 500.0000 mL | Freq: Once | INTRAVENOUS | Status: DC

## 2016-02-08 NOTE — ED Notes (Signed)
Pt incontinent of stool- pt cleaned and new pads under her- EDP informed

## 2016-02-08 NOTE — ED Notes (Signed)
Pt incontinent of scant amount of stool. Pt cleaned and shorts and underwear removed while they are unsoiled and pt on stretcher with pad under her.

## 2016-02-08 NOTE — Telephone Encounter (Signed)
Patient called and stated that she was seen in the ER for GI issues.  She stated that the ER told her to have Terri look at her labs and that she needed to schedule an appointment.  She also stated that the Zofran wasn't covering her nausea.  She would like suggestions on what to take for that.  I scheduled her to see Terri on 02/13/16 at [facility] and advised her that Karna Christmas was off today and Monday, but I would give this to our nurse to address the medication.  [phone]

## 2016-02-08 NOTE — Telephone Encounter (Signed)
Talked with Dr.rehman.  Recommended that the patient take Zofran 8 mg TID. Clear Liquid Diet.  Patient was called and given this information. She says that she has been taking the Zofran 8 mg and it is not helping her N&V, She has also taken 2 Imodium AD and this has Not helped the diarrhea. She says that she is doing good to keep Ginger Ale down. This came on suddenly 02/07/2016 , she presented to the Ed , discharged home. Symptoms worsened and she presented back to the ED,discharged earlier today.  Patient has not been on any antibiotics, and she denies having ate something that would have caused symptoms. She says that she is afebrile. Advised patient that I would send this to [place] for review.

## 2016-02-08 NOTE — Discharge Instructions (Signed)

## 2016-02-08 NOTE — ED Notes (Signed)
Pt request pos- [place]inger[place] over ice chips provided

## 2016-02-08 NOTE — ED Notes (Signed)
Pt reports N better as fluids are infusing- She is out of bed to bathroom and is assisted to return to bed-

## 2016-02-08 NOTE — ED Notes (Signed)
Pt is a light smoker- She was seen yesterday for CP and N/V. She was discharged and was better today until 2000 when she became having vomiting - She took zofran 8 mg at 2000 without relief. She denies stopping any meds. She has a wet hard cough that preceeds her vomiting in this department. EDP has evaluated, labs drawn and IV established.

## 2016-02-08 NOTE — ED Provider Notes (Signed)
CSN: [id number]     Arrival date & time 02/07/16  2323 History   First MD Initiated Contact with Patient 02/07/16 2342     Chief Complaint  Patient presents with  . Emesis     Patient is a 75 y.o. female presenting with vomiting. The history is provided by the patient.  Emesis Associated symptoms: no diarrhea   Patient presents with nausea vomiting. She has had history of the same. Has had previous admissions for it. Had a visit earlier today around 18 hours ago for nausea vomiting and chest pain. States she was supposed be admitted for the chest pain. States she has had no more chest pain and that resolved the nausea resolved earlier. States she been doing better up until around 8:00 tonight when she began having nausea vomiting again. States she took a total of 8 mg Zofran at home. Continued nausea vomiting now presents to the ER. Slight upper abdominal pain. No further chest pain. No headache. No confusion. No sick contacts. States she has had slightly large amount of stool. No fevers or chills.  Past Medical History  Diagnosis Date  . Back pain   . GERD (gastroesophageal reflux disease)   . MI (myocardial infarction) ([place])   . Hypercholesteremia   . Anemia   . Cirrhosis ([place])   . Aortic sclerosis ([place]) 07/04/2011  . Aortic insufficiency 07/04/2011    most recent 2D Echo 02/24/2012  EF greater than 55%  . Carotid stenosis, bilateral 07/04/2011    most recent 05/2010 carotid doppler  . Presence of stent in right coronary artery 07/04/2011  . HTN (hypertension) 07/04/2011  . Coronary artery disease   . Myocardial infarction ([place]) 1995  . GERD (gastroesophageal reflux disease)   . Non-alcoholic fatty liver disease   . UTI (lower urinary tract infection)     chronic/recurring/on bactrim  . CVA (cerebral infarction)     03/07/2014  . Hiatal hernia   . IBS (irritable bowel syndrome)   . Diabetes mellitus 2005  . Varices, esophageal ([place])   . Stroke ([place]) 03/07/2014    no deficits  from stroke  . OSA (obstructive sleep apnea) 07/04/2011    sleep study 2009  AHI 9.91/hr and during REM sleep 35.58/hr  . DDD (degenerative disc disease), lumbar    Past Surgical History  Procedure Laterality Date  . Back surgery  1985  . Tonsillectomy    . Appendectomy    . Colonoscopy    . Eye surgery  08    cataract surgery of the left eye  . Ercp    . Myringotomy  2012    both ears  . Hardware removal Right 01/17/2013    Procedure: REMOVAL OF HARDWARE AND EXCISION ULNAR STYLOID RIGHT WRIST;  Surgeon: Tennis Must, MD;  Location: [facility];  Service: Orthopedics;  Laterality: Right;  . Wrist surgery      rt wrist hardwear removal  . Esophagogastroduodenoscopy N/A 04/01/2013    Procedure: ESOPHAGOGASTRODUODENOSCOPY (EGD);  Surgeon: Rogene Houston, MD;  [facility];  Service: Endoscopy;  Laterality: N/A;  230-rescheduled to 8:30am Ann notified pt  . Esophageal banding N/A 04/01/2013    Procedure: ESOPHAGEAL BANDING;  Surgeon: Rogene Houston, MD;  [facility];  Service: Endoscopy;  Laterality: N/A;  . Esophagogastroduodenoscopy N/A 05/24/2013    Procedure: ESOPHAGOGASTRODUODENOSCOPY (EGD);  Surgeon: Rogene Houston, MD;  [facility];  Service: Endoscopy;  Laterality: N/A;  730  . Esophageal  banding N/A 05/24/2013    Procedure: ESOPHAGEAL BANDING;  Surgeon: Rogene Houston, MD;  [facility];  Service: Endoscopy;  Laterality: N/A;  . Dilation and curettage of uterus      x2  . Vaginal hysterectomy  1972  . Anal fissure repair  1972  . Epi retinal membrane peel Left   . Esophagogastroduodenoscopy N/A 06/21/2014    Procedure: ESOPHAGOGASTRODUODENOSCOPY (EGD);  Surgeon: Rogene Houston, MD;  [facility];  Service: Endoscopy;  Laterality: N/A;  930-rescheduled 10/14 @ 1200 Ann to notify pt  . Esophageal banding N/A 06/21/2014    Procedure: ESOPHAGEAL BANDING;  Surgeon: Rogene Houston, MD;  [facility];  Service: Endoscopy;  Laterality: N/A;  . Cataract extraction w/phaco Right 07/04/2014    Procedure: CATARACT EXTRACTION PHACO AND INTRAOCULAR LENS PLACEMENT (IOC);  Surgeon: Elta Guadeloupe T. Gershon Crane, MD;  [facility];  Service: Ophthalmology;  Laterality: Right;  CDE:13.13  . Cardiac catheterization  1995.,2006,1997    stent to the proximal RCA after MI    Family History  Problem Relation Age of Onset  . Diabetes Mother   . Heart failure Father   . Heart failure Maternal Aunt    Social History  Substance Use Topics  . Smoking status: Former Smoker -- 0.25 packs/day for 50 years    Types: Cigarettes    Quit date: 01/13/2012  . Smokeless tobacco: Never Used  . Alcohol Use: No   OB History    No data available     Review of Systems  Constitutional: Positive for appetite change and unexpected weight change.  Respiratory: Negative for shortness of breath.   Cardiovascular: Negative for chest pain (chest pain earlier today but not now.).  Gastrointestinal: Positive for nausea and vomiting. Negative for diarrhea and constipation.  Genitourinary: Negative for dysuria.  Musculoskeletal: Negative for back pain.  Skin: Negative for wound.  Hematological: Negative for adenopathy.      Allergies  Tape  Home Medications   Prior to Admission medications   Medication Sig Start Date End Date Taking? Authorizing Provider  aspirin EC 81 MG tablet Take 81 mg by mouth daily.    Historical Provider, MD  atorvastatin (LIPITOR) 40 MG tablet Take 40 mg by mouth daily. Reported on 12/17/2015    Historical Provider, MD  carvedilol (COREG) 6.25 MG tablet Take 6.25 mg by mouth 2 (two) times daily.    Historical Provider, MD  doxylamine, Sleep, (UNISOM) 25 MG tablet Take 25 mg by mouth at bedtime as needed for sleep or rhinitis.     Historical Provider, MD  isosorbide mononitrate (IMDUR) 30 MG 24 hr tablet Take 30 mg by mouth daily.    Historical Provider, MD  omeprazole (PRILOSEC OTC) 20 MG  tablet Take 20 mg by mouth daily.    Historical Provider, MD  ondansetron (ZOFRAN) 4 MG tablet Take 1 tablet (4 mg total) by mouth every 8 (eight) hours as needed for nausea or vomiting. 12/25/15   Collier Salina, MD  polyethylene glycol-electrolytes (NULYTELY/GOLYTELY) 420 g solution Take 4,000 mLs by mouth once. 12/18/15   Butch Penny, NP  rosuvastatin (CRESTOR) 10 MG tablet Take 10 mg by mouth daily.    Historical Provider, MD  sulfamethoxazole-trimethoprim (BACTRIM DS,SEPTRA DS) 800-160 MG tablet Take 1 tablet by mouth daily.    Historical Provider, MD  zolpidem (AMBIEN) 10 MG tablet Take 10 mg by mouth at bedtime.    Historical Provider, MD   BP 144/73  mmHg  Pulse 77  Temp(Src) 98.7 F (37.1 C) (Oral)  Resp 17  Ht 5' 3"  (1.6 m)  Wt 150 lb (68.04 kg)  BMI 26.58 kg/m2  SpO2 97% Physical Exam  Constitutional: She appears well-developed and well-nourished.  HENT:  Head: Atraumatic.  Neck: Neck supple.  Cardiovascular: Normal rate.   Pulmonary/Chest: Effort normal.  Abdominal: Soft. There is no tenderness.  Musculoskeletal: Normal range of motion.  Neurological: She is alert.    ED Course  Procedures (including critical care time) Labs Review Labs Reviewed  COMPREHENSIVE METABOLIC PANEL - Abnormal; Notable for the following:    Glucose, Bld 137 (*)    Calcium 8.8 (*)    All other components within normal limits  URINALYSIS, ROUTINE W REFLEX MICROSCOPIC (NOT AT [facility]) - Abnormal; Notable for the following:    APPearance HAZY (*)    Protein, ur TRACE (*)    All other components within normal limits  URINE MICROSCOPIC-ADD ON - Abnormal; Notable for the following:    Squamous Epithelial / LPF 6-30 (*)    Bacteria, UA RARE (*)    Casts HYALINE CASTS (*)    All other components within normal limits  LIPASE, BLOOD  CBC  TROPONIN I    Imaging Review Dg Chest Portable 1 View  02/07/2016  CLINICAL DATA:  Central chest pressure with nausea and diaphoresis. EXAM: PORTABLE  CHEST 1 VIEW COMPARISON:  06/01/2015 FINDINGS: The cardiomediastinal contours are normal. Pulmonary vasculature is normal. No consolidation, pleural effusion, or pneumothorax. No acute osseous abnormalities are seen. IMPRESSION: No acute pulmonary process. Electronically Signed   By: Jeb Levering M.D.   On: 02/07/2016 06:16   I have personally reviewed and evaluated these images and lab results as part of my medical decision-making.   EKG Interpretation   Date/Time:  Friday February 08 2016 00:25:16 EDT Ventricular Rate:  88 PR Interval:  202 QRS Duration: 98 QT Interval:  413 QTC Calculation: 500 R Axis:   -15 Text Interpretation:  Sinus rhythm Borderline left axis deviation Probable  anteroseptal infarct, old Confirmed by Trayvion Embleton  MD, Harvis Mabus [phone]) on  02/08/2016 1:44:26 AM      MDM   Final diagnoses:  Nausea vomiting and diarrhea    Patient presents with nausea vomiting. History of same. Later developed some diarrhea. Patient feels better after treatment. Labs reassuring. Seen earlier in the morning for chest pain and was going to be admitted, however patient was not willing to stay. No further chest pain since there an EKG and another troponin are negative. This point he can be likely outpatient follow-up. Will discharge home.    Davonna Belling, MD 02/08/16 [phone]

## 2016-02-13 ENCOUNTER — Ambulatory Visit (INDEPENDENT_AMBULATORY_CARE_PROVIDER_SITE_OTHER): Payer: Medicare Other | Admitting: Internal Medicine

## 2016-02-13 ENCOUNTER — Encounter (INDEPENDENT_AMBULATORY_CARE_PROVIDER_SITE_OTHER): Payer: Self-pay | Admitting: Internal Medicine

## 2016-02-13 VITALS — BP 144/62 | HR 60 | Temp 97.6°F | Ht 62.0 in | Wt 152.0 lb

## 2016-02-13 DIAGNOSIS — B349 Viral infection, unspecified: Secondary | ICD-10-CM | POA: Diagnosis not present

## 2016-02-13 MED ORDER — ONDANSETRON HCL 8 MG PO TABS: 8.0000 mg | ORAL_TABLET | Freq: Three times a day (TID) | ORAL | Status: DC | PRN

## 2016-02-13 NOTE — Patient Instructions (Signed)
OV as scheduled.

## 2016-02-13 NOTE — Progress Notes (Signed)
Subjective:    Patient ID: [name], female    DOB: Jun 09, 1941, 75 y.o.   MRN: [id number]  HPI Here today for f/u after recent visit to the ED for nausea x vomiting. Had actually 2 visits to the ED. First visit for N,V, and chest pain. Cardiac negative.  She refused admission. She did not receive IV fluids in the ED.  Present #2 with nausea and vomiting, diarrhea. The nausea and vomiting last x 2 days after last ED visit.  She was having 3-4 stools an hr. The diarrhea lasted x 2 days and resolved. No recent antibiotics. There was no fever.  She says she feels better. She feels 90% better. She is eating normally. She says she has no appetite.  Last weight in April was 153. Today her weight is 152. Hx of cirrhosis complicated by esophageal varices. She denies any melena or BRRB.    CBC    Component Value Date/Time   WBC 7.8 02/08/2016 0009   RBC 4.26 02/08/2016 0009   HGB 12.8 02/08/2016 0009   HCT 38.7 02/08/2016 0009   PLT 160 02/08/2016 0009   MCV 90.8 02/08/2016 0009   MCH 30.0 02/08/2016 0009   MCHC 33.1 02/08/2016 0009   RDW 13.3 02/08/2016 0009   LYMPHSABS 1.4 02/07/2016 0540   MONOABS 0.5 02/07/2016 0540   EOSABS 0.2 02/07/2016 0540   BASOSABS 0.0 02/07/2016 0540    Hepatic Function Latest Ref Rng 02/08/2016 12/25/2015 12/17/2015  Total Protein 6.5 - 8.1 g/dL 6.9 6.8 6.4  Albumin 3.5 - 5.0 g/dL 4.0 3.9 3.8  AST 15 - 41 U/L _0 ALT 14 - 54 U/L _1 Alk Phosphatase 38 - 126 U/L 101 93 101  Total Bilirubin 0.3 - 1.2 mg/dL 0.6 0.7 0.6  Bilirubin, Direct <=0.2 mg/dL - - 0.1         06/21/2014: Procedure: EGD with esophageal variceal banding.  Indications: Patient is 75 year old Caucasian female with cirrhosis secondary to half old complicated by esophageal variceal bleed last year. She is now returning for EGD and banding for recurrent varices. She was hospitalized about 3 months ago in order to have heme positive stool and low hemoglobin but there  is no history of melena or rectal bleeding. Hemoglobin 7 weeks ago was up to 11.8 g. Patient is on low-dose aspirin because of history of CVA(4 months ago)  Impression: Two columns of esophageal varices were banded. Portal gastropath Gastric antral vascular ectasia without stigmata of bleed.   Colonoscopy 2008: Anemia. Few diverticula at sigmoid colon. Three small polyps ablated. Suspicious for adenomas. Prominent submucosal rectal veins. Biopsy: Tubular adenoma.   Review of Systems Past Medical History  Diagnosis Date  . Back pain   . GERD (gastroesophageal reflux disease)   . MI (myocardial infarction) ([place])   . Hypercholesteremia   . Anemia   . Cirrhosis ([place])   . Aortic sclerosis ([place]) 07/04/2011  . Aortic insufficiency 07/04/2011    most recent 2D Echo 02/24/2012  EF greater than 55%  . Carotid stenosis, bilateral 07/04/2011    most recent 05/2010 carotid doppler  . Presence of stent in right coronary artery 07/04/2011  . HTN (hypertension) 07/04/2011  . Coronary artery disease   . Myocardial infarction ([place]) 1995  . GERD (gastroesophageal reflux disease)   . Non-alcoholic fatty liver disease   . UTI (lower urinary tract infection)     chronic/recurring/on bactrim  . CVA (cerebral infarction)  03/07/2014  . Hiatal hernia   . IBS (irritable bowel syndrome)   . Diabetes mellitus 2005  . Varices, esophageal ([place])   . Stroke ([place]) 03/07/2014    no deficits from stroke  . OSA (obstructive sleep apnea) 07/04/2011    sleep study 2009  AHI 9.91/hr and during REM sleep 35.58/hr  . DDD (degenerative disc disease), lumbar     Past Surgical History  Procedure Laterality Date  . Back surgery  1985  . Tonsillectomy    . Appendectomy    . Colonoscopy    . Eye surgery  08    cataract surgery of the left eye  . Ercp    . Myringotomy  2012    both ears  . Hardware removal Right 01/17/2013    Procedure: REMOVAL OF HARDWARE AND EXCISION ULNAR  STYLOID RIGHT WRIST;  Surgeon: Tennis Must, MD;  Location: [facility];  Service: Orthopedics;  Laterality: Right;  . Wrist surgery      rt wrist hardwear removal  . Esophagogastroduodenoscopy N/A 04/01/2013    Procedure: ESOPHAGOGASTRODUODENOSCOPY (EGD);  Surgeon: Rogene Houston, MD;  [facility];  Service: Endoscopy;  Laterality: N/A;  230-rescheduled to 8:30am Ann notified pt  . Esophageal banding N/A 04/01/2013    Procedure: ESOPHAGEAL BANDING;  Surgeon: Rogene Houston, MD;  [facility];  Service: Endoscopy;  Laterality: N/A;  . Esophagogastroduodenoscopy N/A 05/24/2013    Procedure: ESOPHAGOGASTRODUODENOSCOPY (EGD);  Surgeon: Rogene Houston, MD;  [facility];  Service: Endoscopy;  Laterality: N/A;  730  . Esophageal banding N/A 05/24/2013    Procedure: ESOPHAGEAL BANDING;  Surgeon: Rogene Houston, MD;  [facility];  Service: Endoscopy;  Laterality: N/A;  . Dilation and curettage of uterus      x2  . Vaginal hysterectomy  1972  . Anal fissure repair  1972  . Epi retinal membrane peel Left   . Esophagogastroduodenoscopy N/A 06/21/2014    Procedure: ESOPHAGOGASTRODUODENOSCOPY (EGD);  Surgeon: Rogene Houston, MD;  [facility];  Service: Endoscopy;  Laterality: N/A;  930-rescheduled 10/14 @ 1200 Ann to notify pt  . Esophageal banding N/A 06/21/2014    Procedure: ESOPHAGEAL BANDING;  Surgeon: Rogene Houston, MD;  [facility];  Service: Endoscopy;  Laterality: N/A;  . Cataract extraction w/phaco Right 07/04/2014    Procedure: CATARACT EXTRACTION PHACO AND INTRAOCULAR LENS PLACEMENT (IOC);  Surgeon: Elta Guadeloupe T. Gershon Crane, MD;  [facility];  Service: Ophthalmology;  Laterality: Right;  CDE:13.13  . Cardiac catheterization  1995.,2006,1997    stent to the proximal RCA after MI     Allergies  Allergen Reactions  . Tape Rash    ADHESIVE TAPE    Current Outpatient Prescriptions on File Prior to Visit    Medication Sig Dispense Refill  . aspirin EC 81 MG tablet Take 81 mg by mouth daily.    Marland Kitchen atorvastatin (LIPITOR) 40 MG tablet Take 40 mg by mouth daily. Reported on 12/17/2015    . carvedilol (COREG) 6.25 MG tablet Take 6.25 mg by mouth 2 (two) times daily.    Marland Kitchen doxylamine, Sleep, (UNISOM) 25 MG tablet Take 25 mg by mouth at bedtime as needed for sleep or rhinitis.     Marland Kitchen isosorbide mononitrate (IMDUR) 30 MG 24 hr tablet Take 30 mg by mouth daily.    Marland Kitchen omeprazole (PRILOSEC OTC) 20 MG tablet Take 20 mg by mouth daily.    . ondansetron (  ZOFRAN) 4 MG tablet Take 1 tablet (4 mg total) by mouth every 8 (eight) hours as needed for nausea or vomiting. 20 tablet 0  . sulfamethoxazole-trimethoprim (BACTRIM DS,SEPTRA DS) 800-160 MG tablet Take 1 tablet by mouth daily.    Marland Kitchen zolpidem (AMBIEN) 10 MG tablet Take 10 mg by mouth at bedtime.    . polyethylene glycol-electrolytes (NULYTELY/GOLYTELY) 420 g solution Take 4,000 mLs by mouth once. (Patient not taking: Reported on 02/13/2016) 4000 mL 0  . [DISCONTINUED] promethazine (PHENERGAN) 12.5 MG tablet Take 1 tablet (12.5 mg total) by mouth every 6 (six) hours as needed for nausea or vomiting. (Patient not taking: Reported on 12/25/2015) 15 tablet 0   No current facility-administered medications on file prior to visit.        Objective:   Physical Exam Blood pressure 144/62, pulse 60, temperature 97.6 F (36.4 C), height _0  (1.575 m), weight 152 lb (68.947 kg). Alert and oriented. Skin warm and dry. Oral mucosa is moist.   . Sclera anicteric, conjunctivae is pink. Thyroid not enlarged. No cervical lymphadenopathy. Lungs clear. Heart regular rate and rhythm.  Abdomen is soft. Bowel sounds are positive. No hepatomegaly. No abdominal masses felt. No tenderness.  No edema to lower extremities.          Assessment & Plan:  Probably viral syndrome which has resolved. Rx for Zofran 42m sent to her pharmacy.  Encouraged to eat healthy. Exercise. If any  problems call our office.

## 2016-02-29 DIAGNOSIS — E1142 Type 2 diabetes mellitus with diabetic polyneuropathy: Secondary | ICD-10-CM | POA: Diagnosis not present

## 2016-02-29 DIAGNOSIS — L851 Acquired keratosis [keratoderma] palmaris et plantaris: Secondary | ICD-10-CM | POA: Diagnosis not present

## 2016-02-29 DIAGNOSIS — B351 Tinea unguium: Secondary | ICD-10-CM | POA: Diagnosis not present

## 2016-03-25 ENCOUNTER — Encounter (INDEPENDENT_AMBULATORY_CARE_PROVIDER_SITE_OTHER): Payer: Self-pay | Admitting: Internal Medicine

## 2016-03-25 ENCOUNTER — Ambulatory Visit (INDEPENDENT_AMBULATORY_CARE_PROVIDER_SITE_OTHER): Payer: Medicare Other | Admitting: Internal Medicine

## 2016-03-25 VITALS — BP 148/72 | HR 64 | Temp 97.7°F | Ht 62.0 in | Wt 152.2 lb

## 2016-03-25 DIAGNOSIS — K760 Fatty (change of) liver, not elsewhere classified: Secondary | ICD-10-CM | POA: Diagnosis not present

## 2016-03-25 DIAGNOSIS — R110 Nausea: Secondary | ICD-10-CM

## 2016-03-25 DIAGNOSIS — K219 Gastro-esophageal reflux disease without esophagitis: Secondary | ICD-10-CM | POA: Diagnosis not present

## 2016-03-25 MED ORDER — OMEPRAZOLE 20 MG PO CPDR: 20.0000 mg | DELAYED_RELEASE_CAPSULE | Freq: Two times a day (BID) | ORAL | Status: DC

## 2016-03-25 MED ORDER — ONDANSETRON HCL 8 MG PO TABS: 8.0000 mg | ORAL_TABLET | Freq: Three times a day (TID) | ORAL | Status: DC | PRN

## 2016-03-25 NOTE — Progress Notes (Signed)
Subjective:    Patient ID: [name], female    DOB: 12/13/1940, 75 y.o.   MRN: [id number]  HPIPresents today with c/o nausea. Has been taking Zofran for nausea. She says she has a bad taste in her mouth. She says she has a terrible taste in her mouth. She has had the taste for about 6 months. Recently seen at dentist and he could not see any reason why she had a bad taste in her mouth. She had a filling changed.  She is using mouth wash.  No drainage from her gums. Her appetite is okay. She is making her self eat. She has maintained her weight. Has a BM x 1 a week. No melena or BRRB.  Hx of NAFLD. Hepatic Function Latest Ref Rng 02/08/2016 12/25/2015 12/17/2015  Total Protein 6.5 - 8.1 g/dL 6.9 6.8 6.4  Albumin 3.5 - 5.0 g/dL 4.0 3.9 3.8  AST 15 - 41 U/L 28 26 29   ALT 14 - 54 U/L 26 25 26   Alk Phosphatase 38 - 126 U/L 101 93 101  Total Bilirubin 0.3 - 1.2 mg/dL 0.6 0.7 0.6  Bilirubin, Direct <=0.2 mg/dL - - 0.1   CBC    Component Value Date/Time   WBC 7.8 02/08/2016 0009   RBC 4.26 02/08/2016 0009   HGB 12.8 02/08/2016 0009   HCT 38.7 02/08/2016 0009   PLT 160 02/08/2016 0009   MCV 90.8 02/08/2016 0009   MCH 30.0 02/08/2016 0009   MCHC 33.1 02/08/2016 0009   RDW 13.3 02/08/2016 0009   LYMPHSABS 1.4 02/07/2016 0540   MONOABS 0.5 02/07/2016 0540   EOSABS 0.2 02/07/2016 0540   BASOSABS 0.0 02/07/2016 0540      12/19/2015 US abdomen: surveillance NAFLD. IMPRESSION: 1. Liver echotexture is coarse and nodular consistent with patient's known cirrhosis.  2. No gallstones noted. However tiny gallstones were noted on prior CT of 07/09/2015. Common bile duct is slightly prominent 7.1 mm. No obstructing lesion is identified. Correlate with LFTs.   Review of Systems      Past Medical History  Diagnosis Date  . Back pain   . GERD (gastroesophageal reflux disease)   . MI (myocardial infarction) ([place])   . Hypercholesteremia   . Anemia   . Cirrhosis ([place])   . Aortic  sclerosis ([place]) 07/04/2011  . Aortic insufficiency 07/04/2011    most recent 2D Echo 02/24/2012  EF greater than 55%  . Carotid stenosis, bilateral 07/04/2011    most recent 05/2010 carotid doppler  . Presence of stent in right coronary artery 07/04/2011  . HTN (hypertension) 07/04/2011  . Coronary artery disease   . Myocardial infarction ([place]) 1995  . GERD (gastroesophageal reflux disease)   . Non-alcoholic fatty liver disease   . UTI (lower urinary tract infection)     chronic/recurring/on bactrim  . CVA (cerebral infarction)     03/07/2014  . Hiatal hernia   . IBS (irritable bowel syndrome)   . Diabetes mellitus 2005  . Varices, esophageal ([place])   . Stroke ([place]) 03/07/2014    no deficits from stroke  . OSA (obstructive sleep apnea) 07/04/2011    sleep study 2009  AHI 9.91/hr and during REM sleep 35.58/hr  . DDD (degenerative disc disease), lumbar     Past Surgical History  Procedure Laterality Date  . Back surgery  1985  . Tonsillectomy    . Appendectomy    . Colonoscopy    . Eye surgery  08    cataract  surgery of the left eye  . Ercp    . Myringotomy  2012    both ears  . Hardware removal Right 01/17/2013    Procedure: REMOVAL OF HARDWARE AND EXCISION ULNAR STYLOID RIGHT WRIST;  Surgeon: Tennis Must, MD;  Location: [facility];  Service: Orthopedics;  Laterality: Right;  . Wrist surgery      rt wrist hardwear removal  . Esophagogastroduodenoscopy N/A 04/01/2013    Procedure: ESOPHAGOGASTRODUODENOSCOPY (EGD);  Surgeon: Rogene Houston, MD;  [facility];  Service: Endoscopy;  Laterality: N/A;  230-rescheduled to 8:30am Ann notified pt  . Esophageal banding N/A 04/01/2013    Procedure: ESOPHAGEAL BANDING;  Surgeon: Rogene Houston, MD;  [facility];  Service: Endoscopy;  Laterality: N/A;  . Esophagogastroduodenoscopy N/A 05/24/2013    Procedure: ESOPHAGOGASTRODUODENOSCOPY (EGD);  Surgeon: Rogene Houston, MD;  [facility];   Service: Endoscopy;  Laterality: N/A;  730  . Esophageal banding N/A 05/24/2013    Procedure: ESOPHAGEAL BANDING;  Surgeon: Rogene Houston, MD;  [facility];  Service: Endoscopy;  Laterality: N/A;  . Dilation and curettage of uterus      x2  . Vaginal hysterectomy  1972  . Anal fissure repair  1972  . Epi retinal membrane peel Left   . Esophagogastroduodenoscopy N/A 06/21/2014    Procedure: ESOPHAGOGASTRODUODENOSCOPY (EGD);  Surgeon: Rogene Houston, MD;  [facility];  Service: Endoscopy;  Laterality: N/A;  930-rescheduled 10/14 @ 1200 Ann to notify pt  . Esophageal banding N/A 06/21/2014    Procedure: ESOPHAGEAL BANDING;  Surgeon: Rogene Houston, MD;  [facility];  Service: Endoscopy;  Laterality: N/A;  . Cataract extraction w/phaco Right 07/04/2014    Procedure: CATARACT EXTRACTION PHACO AND INTRAOCULAR LENS PLACEMENT (IOC);  Surgeon: Elta Guadeloupe T. Gershon Crane, MD;  [facility];  Service: Ophthalmology;  Laterality: Right;  CDE:13.13  . Cardiac catheterization  1995.,2006,1997    stent to the proximal RCA after MI     Allergies  Allergen Reactions  . Tape Rash    ADHESIVE TAPE    Current Outpatient Prescriptions on File Prior to Visit  Medication Sig Dispense Refill  . aspirin EC 81 MG tablet Take 81 mg by mouth daily.    Marland Kitchen atorvastatin (LIPITOR) 40 MG tablet Take 40 mg by mouth daily. Reported on 12/17/2015    . carvedilol (COREG) 6.25 MG tablet Take 6.25 mg by mouth 2 (two) times daily.    Marland Kitchen doxylamine, Sleep, (UNISOM) 25 MG tablet Take 25 mg by mouth at bedtime as needed for sleep or rhinitis.     Marland Kitchen isosorbide mononitrate (IMDUR) 30 MG 24 hr tablet Take 30 mg by mouth daily.    Marland Kitchen omeprazole (PRILOSEC OTC) 20 MG tablet Take 20 mg by mouth daily.    . polyethylene glycol-electrolytes (NULYTELY/GOLYTELY) 420 g solution Take 4,000 mLs by mouth once. 4000 mL 0  . sulfamethoxazole-trimethoprim (BACTRIM DS,SEPTRA DS) 800-160 MG tablet Take 1 tablet by  mouth daily.    Marland Kitchen zolpidem (AMBIEN) 10 MG tablet Take 10 mg by mouth at bedtime.    . [DISCONTINUED] promethazine (PHENERGAN) 12.5 MG tablet Take 1 tablet (12.5 mg total) by mouth every 6 (six) hours as needed for nausea or vomiting. (Patient not taking: Reported on 12/25/2015) 15 tablet 0   No current facility-administered medications on file prior to visit.       Objective:   Physical ExamBlood pressure 148/72, pulse  64, temperature 97.7 F (36.5 C), height 5' 2"  (1.575 m), weight 152 lb 3.2 oz (69.037 kg). Alert and oriented. Skin warm and dry. Oral mucosa is moist.   . Sclera anicteric, conjunctivae is pink. Thyroid not enlarged. No cervical lymphadenopathy. Lungs clear. Heart regular rate and rhythm.  Abdomen is soft. Bowel sounds are positive. No hepatomegaly. No abdominal masses felt. No tenderness.  No edema to lower extremities.        Assessment & Plan:  GERD: Am going to increase her Omeprazole to BID. Refill on her Zofran NAFLD: Transaminases are stable. Last [place] was in April which showed cirrhosis. OV in 3 months.

## 2016-03-25 NOTE — Patient Instructions (Addendum)
Rx for Omeprazole 28m BID OV in 3 months.

## 2016-03-27 ENCOUNTER — Emergency Department (HOSPITAL_COMMUNITY)
Admission: EM | Admit: 2016-03-27 | Discharge: 2016-03-27 | Disposition: A | Discharge status: Home / Self Care | Payer: Medicare Other | Attending: Emergency Medicine | Admitting: Emergency Medicine

## 2016-03-27 ENCOUNTER — Emergency Department (HOSPITAL_COMMUNITY): Payer: Medicare Other

## 2016-03-27 ENCOUNTER — Encounter (HOSPITAL_COMMUNITY): Payer: Self-pay | Admitting: Emergency Medicine

## 2016-03-27 DIAGNOSIS — I10 Essential (primary) hypertension: Secondary | ICD-10-CM | POA: Diagnosis not present

## 2016-03-27 DIAGNOSIS — Z7982 Long term (current) use of aspirin: Secondary | ICD-10-CM | POA: Insufficient documentation

## 2016-03-27 DIAGNOSIS — E119 Type 2 diabetes mellitus without complications: Secondary | ICD-10-CM | POA: Insufficient documentation

## 2016-03-27 DIAGNOSIS — R5383 Other fatigue: Secondary | ICD-10-CM | POA: Diagnosis not present

## 2016-03-27 DIAGNOSIS — I252 Old myocardial infarction: Secondary | ICD-10-CM | POA: Diagnosis not present

## 2016-03-27 DIAGNOSIS — Z8673 Personal history of transient ischemic attack (TIA), and cerebral infarction without residual deficits: Secondary | ICD-10-CM | POA: Insufficient documentation

## 2016-03-27 DIAGNOSIS — Z87891 Personal history of nicotine dependence: Secondary | ICD-10-CM | POA: Diagnosis not present

## 2016-03-27 DIAGNOSIS — I2510 Atherosclerotic heart disease of native coronary artery without angina pectoris: Secondary | ICD-10-CM | POA: Diagnosis not present

## 2016-03-27 DIAGNOSIS — Z79899 Other long term (current) drug therapy: Secondary | ICD-10-CM | POA: Diagnosis not present

## 2016-03-27 DIAGNOSIS — K529 Noninfective gastroenteritis and colitis, unspecified: Secondary | ICD-10-CM | POA: Insufficient documentation

## 2016-03-27 DIAGNOSIS — R079 Chest pain, unspecified: Secondary | ICD-10-CM | POA: Diagnosis not present

## 2016-03-27 LAB — CBC
HEMATOCRIT: 35.1 % — AB (ref 36.0–46.0)
Hemoglobin: 11.7 g/dL — ABNORMAL LOW (ref 12.0–15.0)
MCH: 29.4 pg (ref 26.0–34.0)
MCHC: 33.3 g/dL (ref 30.0–36.0)
MCV: 88.2 fL (ref 78.0–100.0)
Platelets: 104 10*3/uL — ABNORMAL LOW (ref 150–400)
RBC: 3.98 MIL/uL (ref 3.87–5.11)
RDW: 13.2 % (ref 11.5–15.5)
WBC: 6.1 10*3/uL (ref 4.0–10.5)

## 2016-03-27 LAB — BASIC METABOLIC PANEL
ANION GAP: 6 (ref 5–15)
BUN: 10 mg/dL (ref 6–20)
CALCIUM: 8.2 mg/dL — AB (ref 8.9–10.3)
CO2: 23 mmol/L (ref 22–32)
Chloride: 112 mmol/L — ABNORMAL HIGH (ref 101–111)
Creatinine, Ser: 0.83 mg/dL (ref 0.44–1.00)
Glucose, Bld: 105 mg/dL — ABNORMAL HIGH (ref 65–99)
Potassium: 3.5 mmol/L (ref 3.5–5.1)
Sodium: 141 mmol/L (ref 135–145)

## 2016-03-27 LAB — TROPONIN I

## 2016-03-27 NOTE — ED Notes (Signed)
Wheelchair offered and refused

## 2016-03-27 NOTE — ED Provider Notes (Signed)
  Face-to-face evaluation   History: She was awakened by a pressure sensation in her central chest, followed by diarrhea early this morning. The pressure sensation lasted for 10 minutes only. She has since had some mild nausea. Following the chest discomfort. She developed diarrhea numerous times. There was no blood in the stool. She has continued to have mild nausea without vomiting.    Physical exam: Alert, elderly female who is calm and comfortable. Heart regular rate and rhythm, no murmur. Lungs clear. Abdomen mild epigastric tenderness.     EKG Interpretation  Date/Time:  Thursday March 27 2016 09:47:10 EDT Ventricular Rate:  78 PR Interval:    QRS Duration: 96 QT Interval:  413 QTC Calculation: 471 R Axis:   -5 Text Interpretation:  Sinus rhythm Anteroseptal infarct, old since last tracing no significant change Confirmed by Eulis Foster  MD, Milus Fritze [phone]) on 03/27/2016 10:15:44 AM         Medical screening examination/treatment/procedure(s) were conducted as a shared visit with non-physician practitioner(s) and myself.  I personally evaluated the patient during the encounter  Daleen Bo, MD 03/29/16 1521

## 2016-03-27 NOTE — ED Notes (Signed)
Pt states she has not been feeling well since Tuesday.  States she saw her pcp for nausea and was given Zofran and Omeprazole.  Yesterday began having chest pain with continued nausea.  States it feels like a pressure in center of chest.

## 2016-03-27 NOTE — Discharge Instructions (Signed)
You may continue taking your imodium if your diarrhea persists.  Refer to the b.r.a.t diet for the next 24 hours which may also help with your symptoms.  Your labs, ekg and chest xray are negative today for any cardiac source of your symptoms.

## 2016-03-28 NOTE — ED Provider Notes (Signed)
CSN: [id number]     Arrival date & time 03/27/16  4680 History   First MD Initiated Contact with Patient 03/27/16 1011     Chief Complaint  Patient presents with  . Chest Pain     (Consider location/radiation/quality/duration/timing/severity/associated sxs/prior Treatment) The history is provided by the patient.   [name] is a 75 y.o. female with past medical history outlined below and most significant for distant MI, GERD and cryptogenic cirrhosis with esophageal varices presenting with a 2 day history of nausea.  She was seen by her pcp and given zofran and omeprazole for her symptoms.  Last night she woke around 11 pm with worsened nausea without emesis and a midsternal pressure sensation which lasted about 30 minutes, then resolved and did not remind her of prior MI sx.  She next developed nonbloody, brown watery diarrhea with multiple episodes until 3 am at which time she took imodium.  She endorses still feeling slightly nauseated but has had no emesis. She denies sob, abdominal or back pain, orthopnea, dizziness, but does have generalized fatigue. She has been afebrile.  She denies any recent travel and has not had recent food that tasted bad or appeared improperly cooked.     Past Medical History  Diagnosis Date  . Back pain   . GERD (gastroesophageal reflux disease)   . MI (myocardial infarction) ([place])   . Hypercholesteremia   . Anemia   . Cirrhosis ([place])   . Aortic sclerosis ([place]) 07/04/2011  . Aortic insufficiency 07/04/2011    most recent 2D Echo 02/24/2012  EF greater than 55%  . Carotid stenosis, bilateral 07/04/2011    most recent 05/2010 carotid doppler  . Presence of stent in right coronary artery 07/04/2011  . HTN (hypertension) 07/04/2011  . Coronary artery disease   . Myocardial infarction ([place]) 1995  . GERD (gastroesophageal reflux disease)   . Non-alcoholic fatty liver disease   . UTI (lower urinary tract infection)     chronic/recurring/on bactrim  .  CVA (cerebral infarction)     03/07/2014  . Hiatal hernia   . IBS (irritable bowel syndrome)   . Diabetes mellitus 2005  . Varices, esophageal ([place])   . Stroke ([place]) 03/07/2014    no deficits from stroke  . OSA (obstructive sleep apnea) 07/04/2011    sleep study 2009  AHI 9.91/hr and during REM sleep 35.58/hr  . DDD (degenerative disc disease), lumbar    Past Surgical History  Procedure Laterality Date  . Back surgery  1985  . Tonsillectomy    . Appendectomy    . Colonoscopy    . Eye surgery  08    cataract surgery of the left eye  . Ercp    . Myringotomy  2012    both ears  . Hardware removal Right 01/17/2013    Procedure: REMOVAL OF HARDWARE AND EXCISION ULNAR STYLOID RIGHT WRIST;  Surgeon: Tennis Must, MD;  Location: [facility];  Service: Orthopedics;  Laterality: Right;  . Wrist surgery      rt wrist hardwear removal  . Esophagogastroduodenoscopy N/A 04/01/2013    Procedure: ESOPHAGOGASTRODUODENOSCOPY (EGD);  Surgeon: Rogene Houston, MD;  [facility];  Service: Endoscopy;  Laterality: N/A;  230-rescheduled to 8:30am Ann notified pt  . Esophageal banding N/A 04/01/2013    Procedure: ESOPHAGEAL BANDING;  Surgeon: Rogene Houston, MD;  [facility];  Service: Endoscopy;  Laterality: N/A;  . Esophagogastroduodenoscopy N/A 05/24/2013    Procedure:  ESOPHAGOGASTRODUODENOSCOPY (EGD);  Surgeon: Rogene Houston, MD;  [facility];  Service: Endoscopy;  Laterality: N/A;  730  . Esophageal banding N/A 05/24/2013    Procedure: ESOPHAGEAL BANDING;  Surgeon: Rogene Houston, MD;  [facility];  Service: Endoscopy;  Laterality: N/A;  . Dilation and curettage of uterus      x2  . Vaginal hysterectomy  1972  . Anal fissure repair  1972  . Epi retinal membrane peel Left   . Esophagogastroduodenoscopy N/A 06/21/2014    Procedure: ESOPHAGOGASTRODUODENOSCOPY (EGD);  Surgeon: Rogene Houston, MD;  [facility];  Service:  Endoscopy;  Laterality: N/A;  930-rescheduled 10/14 @ 1200 Ann to notify pt  . Esophageal banding N/A 06/21/2014    Procedure: ESOPHAGEAL BANDING;  Surgeon: Rogene Houston, MD;  [facility];  Service: Endoscopy;  Laterality: N/A;  . Cataract extraction w/phaco Right 07/04/2014    Procedure: CATARACT EXTRACTION PHACO AND INTRAOCULAR LENS PLACEMENT (IOC);  Surgeon: Elta Guadeloupe T. Gershon Crane, MD;  Location: [facility];  Service: Ophthalmology;  Laterality: Right;  CDE:13.13  . Cardiac catheterization  1995.,2006,1997    stent to the proximal RCA after MI    Family History  Problem Relation Age of Onset  . Diabetes Mother   . Heart failure Father   . Heart failure Maternal Aunt    Social History  Substance Use Topics  . Smoking status: Former Smoker -- 0.25 packs/day for 50 years    Types: Cigarettes    Quit date: 01/13/2012  . Smokeless tobacco: Never Used  . Alcohol Use: No   OB History    No data available     Review of Systems  Constitutional: Positive for fatigue. Negative for fever.  HENT: Negative.  Negative for congestion and sore throat.   Eyes: Negative.   Respiratory: Positive for chest tightness. Negative for shortness of breath.   Cardiovascular: Negative for chest pain.  Gastrointestinal: Positive for nausea and diarrhea. Negative for vomiting and abdominal pain.  Genitourinary: Negative.   Musculoskeletal: Negative for joint swelling, arthralgias and neck pain.  Skin: Negative.  Negative for rash and wound.  Neurological: Negative for dizziness, weakness, light-headedness, numbness and headaches.  Psychiatric/Behavioral: Negative.       Allergies  Tape  Home Medications   Prior to Admission medications   Medication Sig Start Date End Date Taking? Authorizing Provider  aspirin EC 81 MG tablet Take 81 mg by mouth daily.   Yes Historical Provider, MD  atorvastatin (LIPITOR) 40 MG tablet Take 40 mg by mouth daily. Reported on 12/17/2015   Yes Historical  Provider, MD  carvedilol (COREG) 6.25 MG tablet Take 6.25 mg by mouth 2 (two) times daily.   Yes Historical Provider, MD  isosorbide mononitrate (IMDUR) 30 MG 24 hr tablet Take 30 mg by mouth daily.   Yes Historical Provider, MD  omeprazole (PRILOSEC) 20 MG capsule Take 1 capsule (20 mg total) by mouth 2 (two) times daily before a meal. 03/25/16  Yes Butch Penny, NP  ondansetron (ZOFRAN) 8 MG tablet Take 1 tablet (8 mg total) by mouth every 8 (eight) hours as needed for nausea or vomiting. 03/25/16  Yes Butch Penny, NP  zolpidem (AMBIEN) 10 MG tablet Take 10 mg by mouth at bedtime.   Yes Historical Provider, MD  polyethylene glycol-electrolytes (NULYTELY/GOLYTELY) 420 g solution Take 4,000 mLs by mouth once. Patient not taking: Reported on 03/27/2016 12/18/15   Butch Penny, NP   BP  139/65 mmHg  Pulse 77  Temp(Src) 97.9 F (36.6 C) (Oral)  Resp 18  Ht 5' 3"  (1.6 m)  Wt 68.947 kg  BMI 26.93 kg/m2  SpO2 98% Physical Exam  Constitutional: She appears well-developed and well-nourished.  HENT:  Head: Normocephalic and atraumatic.  Eyes: Conjunctivae are normal.  Neck: Normal range of motion.  Cardiovascular: Normal rate, regular rhythm, normal heart sounds and intact distal pulses.   Pulmonary/Chest: Effort normal and breath sounds normal. She has no wheezes.  Abdominal: Soft. Bowel sounds are normal. She exhibits no distension, no fluid wave and no pulsatile midline mass. There is tenderness in the epigastric area. There is no rebound and no guarding.  Mild ttp epigastric without guard or rebound.  Musculoskeletal: Normal range of motion. She exhibits no edema.  Neurological: She is alert.  Skin: Skin is warm and dry.  Psychiatric: She has a normal mood and affect.  Nursing note and vitals reviewed.   ED Course  Procedures (including critical care time) Labs Review Labs Reviewed  CBC - Abnormal; Notable for the following:    Hemoglobin 11.7 (*)    HCT 35.1 (*)    Platelets  104 (*)    All other components within normal limits  BASIC METABOLIC PANEL - Abnormal; Notable for the following:    Chloride 112 (*)    Glucose, Bld 105 (*)    Calcium 8.2 (*)    All other components within normal limits  TROPONIN I    Imaging Review Dg Chest 2 View  03/27/2016  CLINICAL DATA:  Chest pain beginning at 3 a.m. this morning. EXAM: CHEST  2 VIEW COMPARISON:  Single-view of the chest 02/07/2016. PA and lateral chest 06/01/2015. CT chest 07/06/2011. FINDINGS: The lungs are clear. Heart size is normal. There is no pneumothorax or pleural effusion. Mild appearing thoracic spondylosis is noted. IMPRESSION: No acute disease. Electronically Signed   By: Inge Rise M.D.   On: 03/27/2016 10:44   I have personally reviewed and evaluated these images and lab results as part of my medical decision-making.   EKG Interpretation   Date/Time:  Thursday March 27 2016 09:47:10 EDT Ventricular Rate:  78 PR Interval:    QRS Duration: 96 QT Interval:  413 QTC Calculation: 471 R Axis:   -5 Text Interpretation:  Sinus rhythm Anteroseptal infarct, old since last  tracing no significant change Confirmed by Eulis Foster  MD, ELLIOTT [id number]) on  03/27/2016 10:15:44 AM      MDM   Final diagnoses:  Gastroenteritis    Patients  labs reviewed.  Radiological studies were viewed, interpreted and considered during the medical decision making and disposition process. I agree with radiologists reading.  Results were also discussed with patient. No findings to suggest emergent process, troponin, ekg unchanged. No vomiting or diarrhea while here. Advised b.r.a.t diet, continue imodium prn.  F/u with pcp if sx persist, returning here for any worsened sx.  Pt was seen by Dr Eulis Foster prior to dc home.     Evalee Jefferson, PA-C 03/28/16 1243  Daleen Bo, MD 03/29/16 [phone]

## 2016-04-03 ENCOUNTER — Ambulatory Visit (INDEPENDENT_AMBULATORY_CARE_PROVIDER_SITE_OTHER): Payer: Medicare Other | Admitting: Cardiology

## 2016-04-03 ENCOUNTER — Encounter: Payer: Self-pay | Admitting: Cardiology

## 2016-04-03 VITALS — BP 138/68 | HR 80 | Ht 63.0 in | Wt 149.0 lb

## 2016-04-03 DIAGNOSIS — E785 Hyperlipidemia, unspecified: Secondary | ICD-10-CM | POA: Diagnosis not present

## 2016-04-03 DIAGNOSIS — I6523 Occlusion and stenosis of bilateral carotid arteries: Secondary | ICD-10-CM

## 2016-04-03 DIAGNOSIS — I2510 Atherosclerotic heart disease of native coronary artery without angina pectoris: Secondary | ICD-10-CM | POA: Diagnosis not present

## 2016-04-03 DIAGNOSIS — R002 Palpitations: Secondary | ICD-10-CM | POA: Diagnosis not present

## 2016-04-03 NOTE — Patient Instructions (Signed)
Medication Instructions:  Your physician recommends that you continue on your current medications as directed. Please refer to the Current Medication list given to you today.   Labwork: none  Testing/Procedures: Your physician has requested that you have a carotid duplex. This test is an ultrasound of the carotid arteries in your neck. It looks at blood flow through these arteries that supply the brain with blood. Allow one hour for this exam. There are no restrictions or special instructions.    Follow-Up: Your physician wants you to follow-up in: 1 year.  You will receive a reminder letter in the mail two months in advance. If you don't receive a letter, please call our office to schedule the follow-up appointment.   Any Other Special Instructions Will Be Listed Below (If Applicable).     If you need a refill on your cardiac medications before your next appointment, please call your pharmacy.

## 2016-04-03 NOTE — Progress Notes (Signed)
Clinical Summary [name] is a 75 y.o.female seen today for follow up of the following medical problems.   1. Palpitaions - previous monitor showed infrequent PACs. Clinically from notes suspected PSVT but does not appear ever captured by monitoring.  - occasional palpitations, 1-2 episdoes per month. Lasts just a few seconds.   2. Hx of CVA - no recurrent symptoms  3. CAD - history of MI in 1995, received 2 stents to RCA - last cath 2007 with patent coronaries.   - has has some occasional chest pain. Dull pain, 5/10. Tends to occur at night while sleeping. No other associated symptoms. Not positional. Lasts just a few seconds. Occurs 1-2 times a month.  - no significant SOB or DOE. Off imdur due to headaches, no change in symptoms.  4. Carotid stenosis - mild to moderate bilateral disease by carotid US 02/2014. - no recent neuro symptoms  5. Cryptogenic cirrhosis   6. Chronic nausea and vomiting - multiple ER visit over the last few months  7. Hyperlipidemia - 05/2015 TC 143 TG 86 HDL 49 LDL 77 - compliant with statin   Past Medical History:  Diagnosis Date  . Anemia   . Aortic insufficiency 07/04/2011   most recent 2D Echo 02/24/2012  EF greater than 55%  . Aortic sclerosis ([place]) 07/04/2011  . Back pain   . Carotid stenosis, bilateral 07/04/2011   most recent 05/2010 carotid doppler  . Cirrhosis ([place])   . Coronary artery disease   . CVA (cerebral infarction)    03/07/2014  . DDD (degenerative disc disease), lumbar   . Diabetes mellitus 2005  . GERD (gastroesophageal reflux disease)   . GERD (gastroesophageal reflux disease)   . Hiatal hernia   . HTN (hypertension) 07/04/2011  . Hypercholesteremia   . IBS (irritable bowel syndrome)   . MI (myocardial infarction) ([place])   . Myocardial infarction ([place]) 1995  . Non-alcoholic fatty liver disease   . OSA (obstructive sleep apnea) 07/04/2011   sleep study 2009  AHI 9.91/hr and during REM sleep 35.58/hr  .  Presence of stent in right coronary artery 07/04/2011  . Stroke ([place]) 03/07/2014   no deficits from stroke  . UTI (lower urinary tract infection)    chronic/recurring/on bactrim  . Varices, esophageal ([facility])      Allergies  Allergen Reactions  . Tape Rash    ADHESIVE TAPE     Current Outpatient Prescriptions  Medication Sig Dispense Refill  . aspirin EC 81 MG tablet Take 81 mg by mouth daily.    Marland Kitchen atorvastatin (LIPITOR) 40 MG tablet Take 40 mg by mouth daily. Reported on 12/17/2015    . carvedilol (COREG) 6.25 MG tablet Take 6.25 mg by mouth 2 (two) times daily.    . isosorbide mononitrate (IMDUR) 30 MG 24 hr tablet Take 30 mg by mouth daily.    Marland Kitchen omeprazole (PRILOSEC) 20 MG capsule Take 1 capsule (20 mg total) by mouth 2 (two) times daily before a meal. 60 capsule 3  . ondansetron (ZOFRAN) 8 MG tablet Take 1 tablet (8 mg total) by mouth every 8 (eight) hours as needed for nausea or vomiting. 30 tablet 0  . polyethylene glycol-electrolytes (NULYTELY/GOLYTELY) 420 g solution Take 4,000 mLs by mouth once. (Patient not taking: Reported on 03/27/2016) 4000 mL 0  . zolpidem (AMBIEN) 10 MG tablet Take 10 mg by mouth at bedtime.     No current facility-administered medications for this visit.  Past Surgical History:  Procedure Laterality Date  . [place]  . APPENDECTOMY    . BACK SURGERY  1985  . CARDIAC CATHETERIZATION  1995.,2006,1997   stent to the proximal RCA after MI   . CATARACT EXTRACTION W/PHACO Right 07/04/2014   Procedure: CATARACT EXTRACTION PHACO AND INTRAOCULAR LENS PLACEMENT (IOC);  Surgeon: Elta Guadeloupe T. Gershon Crane, MD;  Location: [facility];  Service: Ophthalmology;  Laterality: Right;  CDE:13.13  . COLONOSCOPY    . DILATION AND CURETTAGE OF UTERUS     x2  . Epi Retinal Membrane Peel Left   . ERCP    . ESOPHAGEAL BANDING N/A 04/01/2013   Procedure: ESOPHAGEAL BANDING;  Surgeon: Rogene Houston, MD;  Location: [facility];  Service: Endoscopy;  Laterality:  N/A;  . ESOPHAGEAL BANDING N/A 05/24/2013   Procedure: ESOPHAGEAL BANDING;  Surgeon: Rogene Houston, MD;  Location: [facility];  Service: Endoscopy;  Laterality: N/A;  . ESOPHAGEAL BANDING N/A 06/21/2014   Procedure: ESOPHAGEAL BANDING;  Surgeon: Rogene Houston, MD;  Location: [facility];  Service: Endoscopy;  Laterality: N/A;  . ESOPHAGOGASTRODUODENOSCOPY N/A 04/01/2013   Procedure: ESOPHAGOGASTRODUODENOSCOPY (EGD);  Surgeon: Rogene Houston, MD;  [facility]: [facility];  Service: Endoscopy;  Laterality: N/A;  230-rescheduled to 8:30am Ann notified pt  . ESOPHAGOGASTRODUODENOSCOPY N/A 05/24/2013   Procedure: ESOPHAGOGASTRODUODENOSCOPY (EGD);  Surgeon: Rogene Houston, MD;  [facility]: [facility];  Service: Endoscopy;  Laterality: N/A;  730  . ESOPHAGOGASTRODUODENOSCOPY N/A 06/21/2014   Procedure: ESOPHAGOGASTRODUODENOSCOPY (EGD);  Surgeon: Rogene Houston, MD;  Location: [facility];  Service: Endoscopy;  Laterality: N/A;  930-rescheduled 10/14 @ 1200 Ann to notify pt  . EYE SURGERY  08   cataract surgery of the left eye  . HARDWARE REMOVAL Right 01/17/2013   Procedure: REMOVAL OF HARDWARE AND EXCISION ULNAR STYLOID RIGHT WRIST;  Surgeon: Tennis Must, MD;  Location: [place];  Service: Orthopedics;  Laterality: Right;  . MYRINGOTOMY  2012   both ears  . TONSILLECTOMY    . VAGINAL HYSTERECTOMY  1972  . WRIST SURGERY     rt wrist hardwear removal     Allergies  Allergen Reactions  . Tape Rash    ADHESIVE TAPE      Family History  Problem Relation Age of Onset  . Diabetes Mother   . Heart failure Father   . Heart failure Maternal Aunt      Social History [name] reports that she quit smoking about 4 years ago. Her smoking use included Cigarettes. She has a 12.50 pack-year smoking history. She has never used smokeless tobacco. [name] reports that she does not drink alcohol.   Review of Systems CONSTITUTIONAL: No weight loss, fever, chills,  weakness or fatigue.  HEENT: Eyes: No visual loss, blurred vision, double vision or yellow sclerae.No hearing loss, sneezing, congestion, runny nose or sore throat.  SKIN: No rash or itching.  CARDIOVASCULAR: per HPI RESPIRATORY: No shortness of breath, cough or sputum.  GASTROINTESTINAL: No anorexia, nausea, vomiting or diarrhea. No abdominal pain or blood.  GENITOURINARY: No burning on urination, no polyuria NEUROLOGICAL: No headache, dizziness, syncope, paralysis, ataxia, numbness or tingling in the extremities. No change in bowel or bladder control.  MUSCULOSKELETAL: No muscle, back pain, joint pain or stiffness.  LYMPHATICS: No enlarged nodes. No history of splenectomy.  PSYCHIATRIC: No history of depression or anxiety.  ENDOCRINOLOGIC: No reports of sweating, cold or heat intolerance. No polyuria  or polydipsia.  Marland Kitchen   Physical Examination Vitals:   04/03/16 1006  BP: 138/68  Pulse: 80   Vitals:   04/03/16 1006  Weight: 149 lb (67.6 kg)  Height: 5' 3"  (1.6 m)    Gen: resting comfortably, no acute distress HEENT: no scleral icterus, pupils equal round and reactive, no palptable cervical adenopathy,  CV: RRR, 2/6 systolic murmur RUSB, no jvd. Bilateral carotid bruits Resp: Clear to auscultation bilaterally GI: abdomen is soft, non-tender, non-distended, normal bowel sounds, no hepatosplenomegaly MSK: extremities are warm, no edema.  Skin: warm, no rash Neuro:  no focal deficits Psych: appropriate affect   Diagnostic Studies  02/2014 echo Study Conclusions  - Left ventricle: The cavity size was normal. Wall thickness was increased in a pattern of mild LVH. Systolic function was vigorous. The estimated ejection fraction was in the range of 70% to 75%. Wall motion was normal; there were no regional wall motion abnormalities. Doppler parameters are consistent with abnormal left ventricular relaxation (grade 1 diastolic dysfunction). - Aortic valve: Mildly to  moderately calcified annulus. Trileaflet; moderately calcified leaflets. There was mild regurgitation. Mean gradient (S): 9 mm Hg. - Mitral valve: Severely calcified posterior annulus. There was trivial regurgitation. Mean gradient (D): 4 mm Hg. Valve area by pressure half-time: 2.04 cm^2. - Left atrium: The atrium was mildly to moderately dilated. - Right atrium: Central venous pressure (est): 3 mm Hg. - Atrial septum: A patent foramen ovale cannot be excluded. - Tricuspid valve: There was trivial regurgitation. - Pulmonary arteries: Systolic pressure could not be accurately estimated. - Pericardium, extracardiac: There was no pericardial effusion.  Impressions:  - Mild LVH with LVEF 47-82%, grade 1 diastolic dysfunction. Severe posterior MAC with trivial mitral regurgitation and borderline mild mitral stenosis. Mild to moderate left atrial enlargement. Moderately sclerotic aortic valve with mild aortic regurgitation, no aortic stenosis. Unable to assess PASP. Cannot exclude PFO.   02/2014 Carotid US RIGHT CAROTID ARTERY: A mild amount of predominately calcified plaque is present at the level of the distal bulb and proximal ICA. Proximal velocities and waveforms are unremarkable and estimated ICA stenosis is less than 50%. Mildly elevated velocities in the higher internal carotid segment within the upper neck noted at a level of mild tortuosity without focal plaque.  RIGHT VERTEBRAL ARTERY: Antegrade flow with normal waveform and velocity.  LEFT CAROTID ARTERY: Stable moderate plaque burden at the level of the distal bulb and extending up to the ICA origin. This causes luminal stenosis with turbulent flow. Estimated proximal left ICA stenosis is 50- 69%.  LEFT VERTEBRAL ARTERY: Antegrade flow with normal waveform and velocity.  IMPRESSION: Bilateral atherosclerotic plaque at the level of the distal carotid bulbs and proximal internal carotid arteries.  Estimated right ICA stenosis is less than 50%. Estimated left ICA stenosis is 50- 69%.  07/2006 cath HEMODYNAMIC DATA: The left ventricle had a pressure of 142/2 with an end diastolic pressure of 9 mmHg. The aortic pressure was 141/60 with a mean of 93 mmHg. There was no pressure gradient across the aortic valve.  ANGIOGRAPHIC DATA: Left ventricle: The left ventricular systolic function was normal with an ejection fraction of 60-65%.  Right coronary artery: The right coronary artery is a large caliber vessel and dominant. There is mild 10% luminal irregularity in the mid- segment of the right coronary artery. Otherwise the RCA is large and smooth.  Left main: The left main is a large caliber vessel. Smooth and normal.  LAD: The LAD shows mild calcification  of the proximal segment. Otherwise there is no significant luminal obstruction. It gives origin to a very small diagonal one and a moderate sized diagonal two.  The ramus intermedius: The ramus intermedius is a moderate caliber vessel. The ostium shows about 10% mild luminal irregularity. This is significantly improved compared to cardiac catheterization done a year ago where it was at least 70%, if not more, stenosed.  Circumflex: The circumflex is a large caliber vessel. It is smooth and has very minimal luminal irregularity.  ASCENDING AORTAGRAM: The ascending aortogram revealed the presence of three aortic valve cusps. There is no aortic ascending aneurysm, aortic regurgitation or aortic dissection.  ABDOMINAL AORTOGRAM: Abdominal aortogram revealed the presence of two renal arteries, one on either side. They are widely patent. The left renal artery showed a mild calcification with around 10% luminal stenosis but no pressure gradient across the renal artery.  IMPRESSION: No significant coronary artery disease by cardiac catheterization. Actually the ramus  intermedius, which had at least a 70% stenosis a year ago now appears to have less than 10-20% stenosis at most and there is significant regression of atherosclerotic plaque with aggressive medical therapy. Overall I suspect that the chest pain is secondary to non cardiac causes, probably suspect GERD, given the fact that she has recurrent chest discomfort in lying down position. She also has body habitus with obesity and GERD should be a primary consideration. She will be discharged home today with PPI; however, if the chest pain persists, evaluation for non cardiac cause of chest pain is indicated.  A total of 120 mL of contrast was utilized for diagnostic angiography.  TECHNIQUES OF THE PROCEDURE: Under the usual sterile precautions, using a 6-French right femoral venous access and a 6-French multipurpose B2 catheter was advanced to the ascending aorta, a 0.025 JR. The catheter was gently advanced to the left ventricle. The left ventricular pressure monitored. Contrast was injected in the left ventricle performed in both the LAO and the RAO projection. The catheter was flushed and gently pulled back. The pressure gradient across the aortic valve was monitored. Then the right coronary artery was selectively engaged and angiography was performed. Then the left coronary artery was selectively engaged and angiography was performed. Then the catheter was pulled into the root of the aorta and ascending aortogram was performed in the LAO projection. Then the catheter was pulled back into the abdominal aorta and abdominal aortogram was performed. Right femoral angiography was performed through the arterial access sheath and the access was closed with [employer] with excellent hemostasis. The patient tolerated the procedure. No immediate complications were noted.    Assessment and Plan   1. Palpitations - fairly mild symptoms, she will continue  coreg. Asked to contact us if symptoms progress, would titrate coreg at that time.   2. Hx of CVA - continue ASA and statin for secondary prevention. ARB previously stopped due to low bp  3. CAD - no current symptoms. Atypical chest pain, recent multiple ER visits with intractable N/V, likely GI etiology of her pain.  - no further cardiac testing at this time.   4. Carotid stenosis - we will repeat carotid US  5. Hyperlipidmeia - continue statin  F/u 1 year Arnoldo Lenis, M.D.

## 2016-04-10 ENCOUNTER — Ambulatory Visit (HOSPITAL_COMMUNITY)
Admission: RE | Admit: 2016-04-10 | Discharge: 2016-04-10 | Disposition: A | Discharge status: Home / Self Care | Payer: Medicare Other | Source: Ambulatory Visit | Attending: Cardiology | Admitting: Cardiology

## 2016-04-10 DIAGNOSIS — I6523 Occlusion and stenosis of bilateral carotid arteries: Secondary | ICD-10-CM | POA: Diagnosis not present

## 2016-04-11 ENCOUNTER — Telehealth: Payer: Self-pay

## 2016-04-11 NOTE — Telephone Encounter (Signed)
-----   Message from Arnoldo Lenis, MD sent at 04/10/2016 12:57 PM EDT ----- Mild to moderate blockages on both sides, we will continue to monitor at this time. Nothing new to do  J BrancH MD

## 2016-04-11 NOTE — Telephone Encounter (Signed)
Pt made aware, copy to pcp.

## 2016-04-21 ENCOUNTER — Ambulatory Visit (INDEPENDENT_AMBULATORY_CARE_PROVIDER_SITE_OTHER): Payer: Self-pay | Admitting: Internal Medicine

## 2016-05-05 ENCOUNTER — Observation Stay (HOSPITAL_COMMUNITY)
Admission: EM | Admit: 2016-05-05 | Discharge: 2016-05-06 | Disposition: A | Discharge status: Home / Self Care | Payer: Medicare Other | Attending: Internal Medicine | Admitting: Internal Medicine

## 2016-05-05 ENCOUNTER — Emergency Department (HOSPITAL_COMMUNITY): Payer: Medicare Other

## 2016-05-05 ENCOUNTER — Encounter (HOSPITAL_COMMUNITY): Payer: Self-pay | Admitting: Emergency Medicine

## 2016-05-05 ENCOUNTER — Observation Stay (HOSPITAL_COMMUNITY): Payer: Medicare Other

## 2016-05-05 DIAGNOSIS — Z79899 Other long term (current) drug therapy: Secondary | ICD-10-CM | POA: Insufficient documentation

## 2016-05-05 DIAGNOSIS — R531 Weakness: Secondary | ICD-10-CM | POA: Diagnosis not present

## 2016-05-05 DIAGNOSIS — I2510 Atherosclerotic heart disease of native coronary artery without angina pectoris: Secondary | ICD-10-CM | POA: Diagnosis not present

## 2016-05-05 DIAGNOSIS — E119 Type 2 diabetes mellitus without complications: Secondary | ICD-10-CM | POA: Diagnosis not present

## 2016-05-05 DIAGNOSIS — E7800 Pure hypercholesterolemia, unspecified: Secondary | ICD-10-CM | POA: Diagnosis not present

## 2016-05-05 DIAGNOSIS — Z955 Presence of coronary angioplasty implant and graft: Secondary | ICD-10-CM

## 2016-05-05 DIAGNOSIS — Z5181 Encounter for therapeutic drug level monitoring: Secondary | ICD-10-CM | POA: Insufficient documentation

## 2016-05-05 DIAGNOSIS — I10 Essential (primary) hypertension: Secondary | ICD-10-CM | POA: Diagnosis not present

## 2016-05-05 DIAGNOSIS — G459 Transient cerebral ischemic attack, unspecified: Secondary | ICD-10-CM | POA: Diagnosis present

## 2016-05-05 DIAGNOSIS — G458 Other transient cerebral ischemic attacks and related syndromes: Principal | ICD-10-CM

## 2016-05-05 DIAGNOSIS — R29810 Facial weakness: Secondary | ICD-10-CM | POA: Diagnosis not present

## 2016-05-05 DIAGNOSIS — Z7982 Long term (current) use of aspirin: Secondary | ICD-10-CM | POA: Diagnosis not present

## 2016-05-05 DIAGNOSIS — Z87891 Personal history of nicotine dependence: Secondary | ICD-10-CM | POA: Diagnosis not present

## 2016-05-05 DIAGNOSIS — I6523 Occlusion and stenosis of bilateral carotid arteries: Secondary | ICD-10-CM | POA: Diagnosis present

## 2016-05-05 DIAGNOSIS — R4781 Slurred speech: Secondary | ICD-10-CM | POA: Diagnosis not present

## 2016-05-05 LAB — COMPREHENSIVE METABOLIC PANEL
ALT: 30 U/L (ref 14–54)
ANION GAP: 11 (ref 5–15)
AST: 40 U/L (ref 15–41)
Albumin: 3.8 g/dL (ref 3.5–5.0)
Alkaline Phosphatase: 108 U/L (ref 38–126)
BUN: 12 mg/dL (ref 6–20)
CHLORIDE: 104 mmol/L (ref 101–111)
CO2: 26 mmol/L (ref 22–32)
CREATININE: 0.92 mg/dL (ref 0.44–1.00)
Calcium: 9.2 mg/dL (ref 8.9–10.3)
GFR calc non Af Amer: >60 mL/min (ref >60)
Glucose, Bld: 111 mg/dL — ABNORMAL HIGH (ref 65–99)
Potassium: 3.8 mmol/L (ref 3.5–5.1)
SODIUM: 141 mmol/L (ref 135–145)
Total Bilirubin: 0.8 mg/dL (ref 0.3–1.2)
Total Protein: 6.6 g/dL (ref 6.5–8.1)

## 2016-05-05 LAB — CBC
HCT: 35 % — ABNORMAL LOW (ref 36.0–46.0)
Hemoglobin: 11.3 g/dL — ABNORMAL LOW (ref 12.0–15.0)
MCH: 28.8 pg (ref 26.0–34.0)
MCHC: 32.3 g/dL (ref 30.0–36.0)
MCV: 89.3 fL (ref 78.0–100.0)
PLATELETS: 130 10*3/uL — AB (ref 150–400)
RBC: 3.92 MIL/uL (ref 3.87–5.11)
RDW: 14.1 % (ref 11.5–15.5)
WBC: 5.2 10*3/uL (ref 4.0–10.5)

## 2016-05-05 LAB — I-STAT CHEM 8, ED
BUN: 11 mg/dL (ref 6–20)
CALCIUM ION: 1.19 mmol/L (ref 1.12–1.23)
CHLORIDE: 104 mmol/L (ref 101–111)
CREATININE: 0.9 mg/dL (ref 0.44–1.00)
GLUCOSE: 102 mg/dL — AB (ref 65–99)
HCT: 34 % — ABNORMAL LOW (ref 36.0–46.0)
Hemoglobin: 11.6 g/dL — ABNORMAL LOW (ref 12.0–15.0)
Potassium: 3.7 mmol/L (ref 3.5–5.1)
Sodium: 142 mmol/L (ref 135–145)
TCO2: 25 mmol/L (ref 0–100)

## 2016-05-05 LAB — APTT: aPTT: 28 seconds (ref 24–36)

## 2016-05-05 LAB — DIFFERENTIAL
BASOS PCT: 0 %
Basophils Absolute: 0 10*3/uL (ref 0.0–0.1)
EOS ABS: 0.2 10*3/uL (ref 0.0–0.7)
EOS PCT: 4 %
Lymphocytes Relative: 25 %
Lymphs Abs: 1.3 10*3/uL (ref 0.7–4.0)
MONO ABS: 0.5 10*3/uL (ref 0.1–1.0)
Monocytes Relative: 10 %
Neutro Abs: 3.2 10*3/uL (ref 1.7–7.7)
Neutrophils Relative %: 61 %

## 2016-05-05 LAB — CBG MONITORING, ED: GLUCOSE-CAPILLARY: 102 mg/dL — AB (ref 65–99)

## 2016-05-05 LAB — TROPONIN I

## 2016-05-05 LAB — PROTIME-INR
INR: 1.08
PROTHROMBIN TIME: 14 s (ref 11.4–15.2)

## 2016-05-05 MED ORDER — ATORVASTATIN CALCIUM 40 MG PO TABS
40.0000 mg | ORAL_TABLET | Freq: Every day | ORAL | Status: DC
  Administered 2016-05-05 – 2016-05-06 (×2): 40 mg via ORAL
  Filled 2016-05-05 (×2): qty 1

## 2016-05-05 MED ORDER — CARVEDILOL 3.125 MG PO TABS
6.2500 mg | ORAL_TABLET | Freq: Two times a day (BID) | ORAL | Status: DC
  Administered 2016-05-05 – 2016-05-06 (×2): 6.25 mg via ORAL
  Filled 2016-05-05 (×2): qty 2

## 2016-05-05 MED ORDER — PANTOPRAZOLE SODIUM 40 MG PO TBEC
40.0000 mg | DELAYED_RELEASE_TABLET | Freq: Every day | ORAL | Status: DC
  Administered 2016-05-05 – 2016-05-06 (×2): 40 mg via ORAL
  Filled 2016-05-05 (×2): qty 1

## 2016-05-05 MED ORDER — ONDANSETRON HCL 4 MG/2ML IJ SOLN
4.0000 mg | Freq: Four times a day (QID) | INTRAMUSCULAR | Status: DC | PRN
  Administered 2016-05-05 – 2016-05-06 (×2): 4 mg via INTRAVENOUS
  Filled 2016-05-05 (×2): qty 2

## 2016-05-05 MED ORDER — ZOLPIDEM TARTRATE 5 MG PO TABS
5.0000 mg | ORAL_TABLET | Freq: Every evening | ORAL | Status: DC | PRN
  Administered 2016-05-05: 5 mg via ORAL
  Filled 2016-05-05: qty 1

## 2016-05-05 MED ORDER — CLOPIDOGREL BISULFATE 75 MG PO TABS
75.0000 mg | ORAL_TABLET | Freq: Every day | ORAL | Status: DC
  Administered 2016-05-06: 75 mg via ORAL
  Filled 2016-05-05 (×2): qty 1

## 2016-05-05 MED ORDER — STROKE: EARLY STAGES OF RECOVERY BOOK
Freq: Once | Status: AC
  Administered 2016-05-06: 12:00:00
  Filled 2016-05-05: qty 1

## 2016-05-05 MED ORDER — SENNOSIDES-DOCUSATE SODIUM 8.6-50 MG PO TABS: 1.0000 | ORAL_TABLET | Freq: Every evening | ORAL | Status: DC | PRN

## 2016-05-05 MED ORDER — ENOXAPARIN SODIUM 40 MG/0.4ML ~~LOC~~ SOLN
40.0000 mg | SUBCUTANEOUS | Status: DC
  Administered 2016-05-05: 40 mg via SUBCUTANEOUS
  Filled 2016-05-05 (×2): qty 0.4

## 2016-05-05 MED ORDER — ONDANSETRON HCL 4 MG PO TABS: 8.0000 mg | ORAL_TABLET | Freq: Three times a day (TID) | ORAL | Status: DC | PRN

## 2016-05-05 MED ORDER — SODIUM CHLORIDE 0.9 % IV SOLN
INTRAVENOUS | Status: DC
  Administered 2016-05-05 – 2016-05-06 (×2): via INTRAVENOUS

## 2016-05-05 MED ORDER — ISOSORBIDE MONONITRATE ER 60 MG PO TB24
30.0000 mg | ORAL_TABLET | Freq: Every day | ORAL | Status: DC
  Administered 2016-05-05: 60 mg via ORAL
  Administered 2016-05-06: 30 mg via ORAL
  Filled 2016-05-05 (×2): qty 1

## 2016-05-05 NOTE — ED Triage Notes (Signed)
Pt reports having feeling of bugs crawling on her last night, went to bed around 2300, woke up at 0530 with slurred speech, slight left facial droop, no unilateral weakness at this time. Pt alert and oriented. Pt went to doctor this morning and was told to come here, pt bp was 200/100.  Denies h/a.

## 2016-05-05 NOTE — ED Provider Notes (Signed)
[facility] DEPT Provider Note   CSN: [id number] Arrival date & time: 05/05/16  0808  By signing my name below, I, Soijett Blue, attest that this documentation has been prepared under the direction and in the presence of Milton Ferguson, MD. Electronically Signed: Soijett Blue, ED Scribe. 05/05/16. 8:37 AM.   History   Chief Complaint Chief Complaint  Patient presents with  . Aphasia    HPI [name] is a 75 y.o. female with a medical hx of Aortic sclerosis, MI, stroke, HTN, CVA, carotid stenosis, and CAD, who presents to the Emergency Department complaining of speech difficulty onset 5:30 AM this morning. Pt reports that on last night she felt as if bugs were crawling under her skin. Pt notes that she woke up this morning with slurred speech and her last conversation with another individual was last night with her son at 8 PM, where she was speaking at her baseline.  Pt states that she was seen by her doctor today and informed to come into the ED for evaluation. Pt is having associated symptoms of numbness to left arm and LLE. She notes that she has not tried any medications for the relief of her symptoms. She denies gait problem and any other symptoms.     Patient complains of some slurred speech and numbness in left arm since she woke up this morning   The history is provided by the patient. No language interpreter was used.  Weakness  This is a new problem. The current episode started 6 to 12 hours ago. The problem occurs constantly. The problem has not changed since onset.Pertinent negatives include no chest pain, no abdominal pain and no headaches. Nothing aggravates the symptoms. She has tried nothing for the symptoms. The treatment provided no relief.    Past Medical History:  Diagnosis Date  . Anemia   . Aortic insufficiency 07/04/2011   most recent 2D Echo 02/24/2012  EF greater than 55%  . Aortic sclerosis ([place]) 07/04/2011  . Back pain   . Carotid stenosis,  bilateral 07/04/2011   most recent 05/2010 carotid doppler  . Cirrhosis ([place])   . Coronary artery disease   . CVA (cerebral infarction)    03/07/2014  . DDD (degenerative disc disease), lumbar   . Diabetes mellitus 2005  . GERD (gastroesophageal reflux disease)   . GERD (gastroesophageal reflux disease)   . Hiatal hernia   . HTN (hypertension) 07/04/2011  . Hypercholesteremia   . IBS (irritable bowel syndrome)   . MI (myocardial infarction) ([place])   . Myocardial infarction ([place]) 1995  . Non-alcoholic fatty liver disease   . OSA (obstructive sleep apnea) 07/04/2011   sleep study 2009  AHI 9.91/hr and during REM sleep 35.58/hr  . Presence of stent in right coronary artery 07/04/2011  . Stroke ([place]) 03/07/2014   no deficits from stroke  . UTI (lower urinary tract infection)    chronic/recurring/on bactrim  . Varices, esophageal [facility])     Patient Active Problem List   Diagnosis Date Noted  . Intractable nausea and vomiting 10/15/2015  . Chest pain 06/01/2015  . Diarrhea 06/01/2015  . Bronchitis 06/01/2015  . Thrombocytopenia ([place]) 06/01/2015  . ARF (acute renal failure) ([place]) 03/20/2014  . CVA (cerebral vascular accident) ([place]) 03/06/2014  . Acute renal failure ([place]) 03/06/2014  . CVA (cerebral infarction) 03/06/2014  . UTI (lower urinary tract infection)   . PSVT (paroxysmal supraventricular tachycardia), recent episode this week 12/09/2013  . Esophageal varices ([place]) 03/29/2013  . Hematemesis  03/29/2013  . Hepatic cirrhosis ([place]) 03/29/2013  . Presence of stent in right coronary artery 07/04/2011  . OSA (obstructive sleep apnea) 07/04/2011  . Aortic sclerosis ([place]) 07/04/2011  . Aortic insufficiency 07/04/2011  . HTN (hypertension) 07/04/2011  . Carotid stenosis, bilateral 07/04/2011  . Chest pain at rest 07/03/2011  . CAD (coronary artery disease) 07/03/2011  . DM (diabetes mellitus) ([place]) 07/03/2011  . Hypercholesteremia 07/03/2011  . GERD (gastroesophageal reflux  disease) 07/03/2011    Past Surgical History:  Procedure Laterality Date  . [place]  . APPENDECTOMY    . BACK SURGERY  1985  . CARDIAC CATHETERIZATION  1995.,2006,1997   stent to the proximal RCA after MI   . CATARACT EXTRACTION W/PHACO Right 07/04/2014   Procedure: CATARACT EXTRACTION PHACO AND INTRAOCULAR LENS PLACEMENT (IOC);  Surgeon: Elta Guadeloupe T. Gershon Crane, MD;  Location: [facility];  Service: Ophthalmology;  Laterality: Right;  CDE:13.13  . COLONOSCOPY    . DILATION AND CURETTAGE OF UTERUS     x2  . Epi Retinal Membrane Peel Left   . ERCP    . ESOPHAGEAL BANDING N/A 04/01/2013   Procedure: ESOPHAGEAL BANDING;  Surgeon: Rogene Houston, MD;  Location: [facility];  Service: Endoscopy;  Laterality: N/A;  . ESOPHAGEAL BANDING N/A 05/24/2013   Procedure: ESOPHAGEAL BANDING;  Surgeon: Rogene Houston, MD;  Location: [facility];  Service: Endoscopy;  Laterality: N/A;  . ESOPHAGEAL BANDING N/A 06/21/2014   Procedure: ESOPHAGEAL BANDING;  Surgeon: Rogene Houston, MD;  Location: [facility];  Service: Endoscopy;  Laterality: N/A;  . ESOPHAGOGASTRODUODENOSCOPY N/A 04/01/2013   Procedure: ESOPHAGOGASTRODUODENOSCOPY (EGD);  Surgeon: Rogene Houston, MD;  Location: [facility];  Service: Endoscopy;  Laterality: N/A;  230-rescheduled to 8:30am Ann notified pt  . ESOPHAGOGASTRODUODENOSCOPY N/A 05/24/2013   Procedure: ESOPHAGOGASTRODUODENOSCOPY (EGD);  Surgeon: Rogene Houston, MD;  Location: [facility];  Service: Endoscopy;  Laterality: N/A;  730  . ESOPHAGOGASTRODUODENOSCOPY N/A 06/21/2014   Procedure: ESOPHAGOGASTRODUODENOSCOPY (EGD);  Surgeon: Rogene Houston, MD;  Location: [facility];  Service: Endoscopy;  Laterality: N/A;  930-rescheduled 10/14 @ 1200 Ann to notify pt  . EYE SURGERY  08   cataract surgery of the left eye  . HARDWARE REMOVAL Right 01/17/2013   Procedure: REMOVAL OF HARDWARE AND EXCISION ULNAR STYLOID RIGHT WRIST;  Surgeon: Tennis Must, MD;  Location:  [place];  Service: Orthopedics;  Laterality: Right;  . MYRINGOTOMY  2012   both ears  . TONSILLECTOMY    . VAGINAL HYSTERECTOMY  1972  . WRIST SURGERY     rt wrist hardwear removal    OB History    No data available       Home Medications    Prior to Admission medications   Medication Sig Start Date End Date Taking? Authorizing Provider  aspirin EC 81 MG tablet Take 81 mg by mouth daily.    Historical Provider, MD  atorvastatin (LIPITOR) 40 MG tablet Take 40 mg by mouth daily. Reported on 12/17/2015    Historical Provider, MD  carvedilol (COREG) 6.25 MG tablet Take 6.25 mg by mouth 2 (two) times daily.    Historical Provider, MD  omeprazole (PRILOSEC) 20 MG capsule Take 1 capsule (20 mg total) by mouth 2 (two) times daily before a meal. 03/25/16   Butch Penny, NP  ondansetron (ZOFRAN) 8 MG tablet Take 1 tablet (8 mg total) by mouth every 8 (eight) hours as needed  for nausea or vomiting. 03/25/16   Butch Penny, NP  zolpidem (AMBIEN) 10 MG tablet Take 10 mg by mouth at bedtime.    Historical Provider, MD    Family History Family History  Problem Relation Age of Onset  . Diabetes Mother   . Heart failure Father   . Heart failure Maternal Aunt     Social History Social History  Substance Use Topics  . Smoking status: Former Smoker    Packs/day: 0.25    Years: 50.00    Types: Cigarettes    Quit date: 01/13/2012  . Smokeless tobacco: Never Used  . Alcohol use No     Allergies   Tape   Review of Systems Review of Systems  Constitutional: Negative for appetite change and fatigue.  HENT: Negative for congestion, ear discharge and sinus pressure.   Eyes: Negative for discharge.  Respiratory: Negative for cough.   Cardiovascular: Negative for chest pain.  Gastrointestinal: Negative for abdominal pain and diarrhea.  Genitourinary: Negative for frequency and hematuria.  Musculoskeletal: Negative for back pain.  Skin: Negative for rash.    Neurological: Positive for speech difficulty, weakness and numbness (left arm and LLE). Negative for seizures and headaches.  Psychiatric/Behavioral: Negative for hallucinations.     Physical Exam Updated Vital Signs BP 135/59   Pulse 70   Temp 98.3 F (36.8 C) (Oral)   Resp 15   Ht 5' 3"  (1.6 m)   Wt 150 lb (68 kg)   SpO2 98%   BMI 26.57 kg/m   Physical Exam  Constitutional: She is oriented to person, place, and time. She appears well-developed.  HENT:  Head: Normocephalic.  Eyes: Conjunctivae and EOM are normal. No scleral icterus.  Neck: Neck supple. No thyromegaly present.  Cardiovascular: Normal rate and regular rhythm.  Exam reveals no gallop and no friction rub.   No murmur heard. Pulmonary/Chest: No stridor. She has no wheezes. She has no rales. She exhibits no tenderness.  Abdominal: She exhibits no distension. There is no tenderness. There is no rebound.  Musculoskeletal: Normal range of motion. She exhibits no edema.  Lymphadenopathy:    She has no cervical adenopathy.  Neurological: She is oriented to person, place, and time. A sensory deficit is present. She exhibits normal muscle tone. Coordination normal.  Minimal slurred speech. Numbness to distal left arm.   Skin: No rash noted. No erythema.  Psychiatric: She has a normal mood and affect. Her behavior is normal.     ED Treatments / Results  DIAGNOSTIC STUDIES: Oxygen Saturation is 98% on RA, nl by my interpretation.    COORDINATION OF CARE: 8:35 AM Discussed treatment plan with pt at bedside which includes labs, EKG, CT head, and pt agreed to plan.   Labs (all labs ordered are listed, but only abnormal results are displayed) Labs Reviewed  CBC - Abnormal; Notable for the following:       Result Value   Hemoglobin 11.3 (*)    HCT 35.0 (*)    Platelets 130 (*)    All other components within normal limits  COMPREHENSIVE METABOLIC PANEL - Abnormal; Notable for the following:    Glucose, Bld 111  (*)    All other components within normal limits  CBG MONITORING, ED - Abnormal; Notable for the following:    Glucose-Capillary 102 (*)    All other components within normal limits  I-STAT CHEM 8, ED - Abnormal; Notable for the following:    Glucose, Bld 102 (*)  Hemoglobin 11.6 (*)    HCT 34.0 (*)    All other components within normal limits  PROTIME-INR  APTT  DIFFERENTIAL  TROPONIN I    EKG  EKG Interpretation  Date/Time:  Monday May 05 2016 08:17:25 EDT Ventricular Rate:  77 PR Interval:    QRS Duration: 101 QT Interval:  425 QTC Calculation: 481 R Axis:   65 Text Interpretation:  Atrial flutter with predominant 3:1 AV block Confirmed by Aldine Grainger  MD, Vernadine Coombs ([id number]) on 05/05/2016 10:02:18 AM       Radiology Ct Head Wo Contrast  Result Date: 05/05/2016 CLINICAL DATA:  75 year old female with acute slurred speech and left facial droop today. EXAM: CT HEAD WITHOUT CONTRAST TECHNIQUE: Contiguous axial images were obtained from the base of the skull through the vertex without intravenous contrast. COMPARISON:  05/12/2014 CT FINDINGS: Brain: Mild generalized cerebral volume loss noted. Remote infarcts in the right frontal parietal region and bilateral basal ganglia again noted. No acute intracranial abnormalities are identified, including mass lesion or mass effect, hydrocephalus, extra-axial fluid collection, midline shift, hemorrhage, or acute infarction. The visualized bony calvarium is unremarkable. Vascular: Mild intracranial vascular calcifications noted. Skull: No acute or suspicious abnormality. Sinuses/Orbits: Unremarkable Other: None. IMPRESSION: No evidence of acute intracranial abnormality. Remote infarcts in the right frontoparietal region and bilateral basal ganglia. Electronically Signed   By: Margarette Canada M.D.   On: 05/05/2016 09:23    Procedures Procedures (including critical care time)  Medications Ordered in ED Medications - No data to display   Initial  Impression / Assessment and Plan / ED Course  I have reviewed the triage vital signs and the nursing notes.  Pertinent labs & imaging results that were available during my care of the patient were reviewed by me and considered in my medical decision making (see chart for details).  Clinical Course   Patient with TIA that has improved now she will readmitted for further evaluation   Final Clinical Impressions(s) / ED Diagnoses   Final diagnoses:  None    New Prescriptions New Prescriptions   No medications on file    The chart was scribed for me under my direct supervision.  I personally performed the history, physical, and medical decision making and all procedures in the evaluation of this patient.Milton Ferguson, MD 05/05/16 1115

## 2016-05-05 NOTE — ED Notes (Signed)
Code Stroke not to be activated at this time.

## 2016-05-05 NOTE — H&P (Signed)
History and Physical    Dreya Buhrman Semple PJS:[id number] DOB: 28-Mar-1941 DOA: 05/05/2016  Referring MD/NP/PA: Milton Ferguson, EDP PCP: Glo Herring., MD  Patient coming from: Home  Chief Complaint: Slurred speech, left arm numbness  HPI: [name] is a 75 y.o. female with history of bilateral mid grade carotid stenosis, hypertension, diabetes, coronary artery disease with a prior CVA in 2015 presents with the above complaints. She was last seen normal at 6 PM last evening when her son went to bring her dinner. She awoke this morning and went to take her dogs out and noticed that her speech was slurred, subsequently noticed some left arm numbness went to see her PCP who sent her to the hospital for evaluation. In the ED CT had has been performed without acute abnormality but remote infarcts. Labs are essentially within normal limits. Admission requested.  Past Medical/Surgical History: Past Medical History:  Diagnosis Date  . Anemia   . Aortic insufficiency 07/04/2011   most recent 2D Echo 02/24/2012  EF greater than 55%  . Aortic sclerosis ([place]) 07/04/2011  . Back pain   . Carotid stenosis, bilateral 07/04/2011   most recent 05/2010 carotid doppler  . Cirrhosis ([place])   . Coronary artery disease   . CVA (cerebral infarction)    03/07/2014  . DDD (degenerative disc disease), lumbar   . Diabetes mellitus 2005  . GERD (gastroesophageal reflux disease)   . GERD (gastroesophageal reflux disease)   . Hiatal hernia   . HTN (hypertension) 07/04/2011  . Hypercholesteremia   . IBS (irritable bowel syndrome)   . MI (myocardial infarction) ([place])   . Myocardial infarction ([place]) 1995  . Non-alcoholic fatty liver disease   . OSA (obstructive sleep apnea) 07/04/2011   sleep study 2009  AHI 9.91/hr and during REM sleep 35.58/hr  . Presence of stent in right coronary artery 07/04/2011  . Stroke ([place]) 03/07/2014   no deficits from stroke  . UTI (lower urinary tract infection)    chronic/recurring/on bactrim  . Varices, esophageal ([place])     Past Surgical History:  Procedure Laterality Date  . [place]  . APPENDECTOMY    . BACK SURGERY  1985  . CARDIAC CATHETERIZATION  1995.,2006,1997   stent to the proximal RCA after MI   . CATARACT EXTRACTION W/PHACO Right 07/04/2014   Procedure: CATARACT EXTRACTION PHACO AND INTRAOCULAR LENS PLACEMENT (IOC);  Surgeon: Elta Guadeloupe T. Gershon Crane, MD;  Location: [facility];  Service: Ophthalmology;  Laterality: Right;  CDE:13.13  . COLONOSCOPY    . DILATION AND CURETTAGE OF UTERUS     x2  . Epi Retinal Membrane Peel Left   . ERCP    . ESOPHAGEAL BANDING N/A 04/01/2013   Procedure: ESOPHAGEAL BANDING;  Surgeon: Rogene Houston, MD;  Location: [facility];  Service: Endoscopy;  Laterality: N/A;  . ESOPHAGEAL BANDING N/A 05/24/2013   Procedure: ESOPHAGEAL BANDING;  Surgeon: Rogene Houston, MD;  Location: [facility];  Service: Endoscopy;  Laterality: N/A;  . ESOPHAGEAL BANDING N/A 06/21/2014   Procedure: ESOPHAGEAL BANDING;  Surgeon: Rogene Houston, MD;  Location: [facility];  Service: Endoscopy;  Laterality: N/A;  . ESOPHAGOGASTRODUODENOSCOPY N/A 04/01/2013   Procedure: ESOPHAGOGASTRODUODENOSCOPY (EGD);  Surgeon: Rogene Houston, MD;  [facility]: [facility];  Service: Endoscopy;  Laterality: N/A;  230-rescheduled to 8:30am Ann notified pt  . ESOPHAGOGASTRODUODENOSCOPY N/A 05/24/2013   Procedure: ESOPHAGOGASTRODUODENOSCOPY (EGD);  Surgeon: Rogene Houston, MD;  [facility]: [facility];  Service: Endoscopy;  Laterality: N/A;  730  . ESOPHAGOGASTRODUODENOSCOPY N/A 06/21/2014   Procedure: ESOPHAGOGASTRODUODENOSCOPY (EGD);  Surgeon: Rogene Houston, MD;  Location: [facility];  Service: Endoscopy;  Laterality: N/A;  930-rescheduled 10/14 @ 1200 Ann to notify pt  . EYE SURGERY  08   cataract surgery of the left eye  . HARDWARE REMOVAL Right 01/17/2013   Procedure: REMOVAL OF HARDWARE AND EXCISION ULNAR STYLOID RIGHT WRIST;   Surgeon: Tennis Must, MD;  Location: [facility];  Service: Orthopedics;  Laterality: Right;  . MYRINGOTOMY  2012   both ears  . TONSILLECTOMY    . VAGINAL HYSTERECTOMY  1972  . WRIST SURGERY     rt wrist hardwear removal    Social History:  reports that she quit smoking about 4 years ago. Her smoking use included Cigarettes. She has a 12.50 pack-year smoking history. She has never used smokeless tobacco. She reports that she does not drink alcohol or use drugs.  Allergies: Allergies  Allergen Reactions  . Tape Rash    ADHESIVE TAPE    Family History:  Family History  Problem Relation Age of Onset  . Diabetes Mother   . Heart failure Father   . Heart failure Maternal Aunt     Prior to Admission medications   Medication Sig Start Date End Date Taking? Authorizing Provider  acetaminophen (TYLENOL) 500 MG tablet Take 1,000-1,500 mg by mouth every 6 (six) hours as needed for moderate pain.   Yes Historical Provider, MD  aspirin 325 MG tablet Take 650 mg by mouth every 8 (eight) hours as needed for moderate pain.   Yes Historical Provider, MD  atorvastatin (LIPITOR) 40 MG tablet Take 40 mg by mouth daily. Reported on 12/17/2015   Yes Historical Provider, MD  carvedilol (COREG) 6.25 MG tablet Take 6.25 mg by mouth 2 (two) times daily.   Yes Historical Provider, MD  omeprazole (PRILOSEC) 20 MG capsule Take 1 capsule (20 mg total) by mouth 2 (two) times daily before a meal. 03/25/16  Yes Butch Penny, NP  ondansetron (ZOFRAN) 8 MG tablet Take 1 tablet (8 mg total) by mouth every 8 (eight) hours as needed for nausea or vomiting. 03/25/16  Yes Butch Penny, NP  zolpidem (AMBIEN) 10 MG tablet Take 10 mg by mouth at bedtime.   Yes Historical Provider, MD  isosorbide mononitrate (IMDUR) 30 MG 24 hr tablet Take 1 tablet by mouth daily. 04/20/16   Historical Provider, MD    Review of Systems:  Constitutional: Denies fever, chills, diaphoresis, appetite change and fatigue.    HEENT: Denies photophobia, eye pain, redness, hearing loss, ear pain, congestion, sore throat, rhinorrhea, sneezing, mouth sores, trouble swallowing, neck pain, neck stiffness and tinnitus.   Respiratory: Denies SOB, DOE, cough, chest tightness,  and wheezing.   Cardiovascular: Denies chest pain, palpitations and leg swelling.  Gastrointestinal: Denies nausea, vomiting, abdominal pain, diarrhea, constipation, blood in stool and abdominal distention.  Genitourinary: Denies dysuria, urgency, frequency, hematuria, flank pain and difficulty urinating.  Endocrine: Denies: hot or cold intolerance, sweats, changes in hair or nails, polyuria, polydipsia. Musculoskeletal: Denies myalgias, back pain, joint swelling, arthralgias and gait problem.  Skin: Denies pallor, rash and wound.  Neurological: Denies dizziness, seizures, syncope, weakness, light-headedness and headaches.  Hematological: Denies adenopathy. Easy bruising, personal or family bleeding history  Psychiatric/Behavioral: Denies suicidal ideation, mood changes, confusion, nervousness, sleep disturbance and agitation    Physical Exam: Vitals:   05/05/16 1205 05/05/16 1206 05/05/16  1245 05/05/16 1445  BP: 127/78  (!) 210/57 (!) 166/47  Pulse:  80 65 74  Resp:   18 18  Temp:   98.3 F (36.8 C) 98.6 F (37 C)  TempSrc:   Oral Oral  SpO2:  97% 99% 98%  Weight:   68 kg (150 lb)   Height:   5' 3"  (1.6 m)      Constitutional: NAD, calm, comfortable Eyes: PERRL, lids and conjunctivae normal ENMT: Mucous membranes are moist. Posterior pharynx clear of any exudate or lesions.Normal dentition.  Neck: normal, supple, no masses, no thyromegaly Respiratory: clear to auscultation bilaterally, no wheezing, no crackles. Normal respiratory effort. No accessory muscle use.  Cardiovascular: Regular rate and rhythm, no murmurs / rubs / gallops. No extremity edema. 2+ pedal pulses. No carotid bruits.  Abdomen: no tenderness, no masses palpated. No  hepatosplenomegaly. Bowel sounds positive.  Musculoskeletal: no clubbing / cyanosis. No joint deformity upper and lower extremities. Good ROM, no contractures. Normal muscle tone.  Skin: no rashes, lesions, ulcers. No induration Neurologic: CN 2-12 grossly intact. Sensation Decreased to left upper extremity, DTR normal. Strength 5/5 in all 4. Proprioception intact, Babinski downgoing bilaterally Psychiatric: Normal judgment and insight. Alert and oriented x 3. Normal mood.    Labs on Admission: I have personally reviewed the following labs and imaging studies  CBC:  Recent Labs Lab 05/05/16 0826 05/05/16 0846  WBC 5.2  --   NEUTROABS 3.2  --   HGB 11.3* 11.6*  HCT 35.0* 34.0*  MCV 89.3  --   PLT 130*  --    Basic Metabolic Panel:  Recent Labs Lab 05/05/16 0826 05/05/16 0846  NA 141 142  K 3.8 3.7  CL 104 104  CO2 26  --   GLUCOSE 111* 102*  BUN 12 11  CREATININE 0.92 0.90  CALCIUM 9.2  --    GFR: Estimated Creatinine Clearance: 51.5 mL/min (by C-G formula based on SCr of 0.9 mg/dL). Liver Function Tests:  Recent Labs Lab 05/05/16 0826  AST 40  ALT 30  ALKPHOS 108  BILITOT 0.8  PROT 6.6  ALBUMIN 3.8   No results for input(s): LIPASE, AMYLASE in the last 168 hours. No results for input(s): AMMONIA in the last 168 hours. Coagulation Profile:  Recent Labs Lab 05/05/16 0826  INR 1.08   Cardiac Enzymes:  Recent Labs Lab 05/05/16 0826  TROPONINI <0.03   BNP (last 3 results) No results for input(s): PROBNP in the last 8760 hours. HbA1C: No results for input(s): HGBA1C in the last 72 hours. CBG:  Recent Labs Lab 05/05/16 0822  GLUCAP 102*   Lipid Profile: No results for input(s): CHOL, HDL, LDLCALC, TRIG, CHOLHDL, LDLDIRECT in the last 72 hours. Thyroid Function Tests: No results for input(s): TSH, T4TOTAL, FREET4, T3FREE, THYROIDAB in the last 72 hours. Anemia Panel: No results for input(s): VITAMINB12, FOLATE, FERRITIN, TIBC, IRON,  RETICCTPCT in the last 72 hours. Urine analysis:    Component Value Date/Time   COLORURINE YELLOW 02/08/2016 0205   APPEARANCEUR HAZY (A) 02/08/2016 0205   LABSPEC 1.025 02/08/2016 0205   PHURINE 5.5 02/08/2016 0205   GLUCOSEU NEGATIVE 02/08/2016 0205   HGBUR NEGATIVE 02/08/2016 0205   BILIRUBINUR NEGATIVE 02/08/2016 0205   KETONESUR NEGATIVE 02/08/2016 0205   PROTEINUR TRACE (A) 02/08/2016 0205   UROBILINOGEN 0.2 07/09/2015 0930   NITRITE NEGATIVE 02/08/2016 0205   LEUKOCYTESUR NEGATIVE 02/08/2016 0205   Sepsis Labs: @LABRCNTIP (procalcitonin:4,lacticidven:4) )No results found for this or any previous visit (from  the past 240 hour(s)).   Radiological Exams on Admission: Ct Head Wo Contrast  Result Date: 05/05/2016 CLINICAL DATA:  75 year old female with acute slurred speech and left facial droop today. EXAM: CT HEAD WITHOUT CONTRAST TECHNIQUE: Contiguous axial images were obtained from the base of the skull through the vertex without intravenous contrast. COMPARISON:  05/12/2014 CT FINDINGS: Brain: Mild generalized cerebral volume loss noted. Remote infarcts in the right frontal parietal region and bilateral basal ganglia again noted. No acute intracranial abnormalities are identified, including mass lesion or mass effect, hydrocephalus, extra-axial fluid collection, midline shift, hemorrhage, or acute infarction. The visualized bony calvarium is unremarkable. Vascular: Mild intracranial vascular calcifications noted. Skull: No acute or suspicious abnormality. Sinuses/Orbits: Unremarkable Other: None. IMPRESSION: No evidence of acute intracranial abnormality. Remote infarcts in the right frontoparietal region and bilateral basal ganglia. Electronically Signed   By: Margarette Canada M.D.   On: 05/05/2016 09:23    EKG: Independently reviewed. Sinus rhythm at a rate of 77, no acute ischemic abnormalities  Assessment/Plan Principal Problem:   TIA (transient ischemic attack) Active Problems:    CAD (coronary artery disease)   DM (diabetes mellitus) (HCC)   Hypercholesteremia   Presence of stent in right coronary artery   HTN (hypertension)   Carotid stenosis, bilateral    TIA -We'll admit for stroke workup including MRI, echo, carotid Dopplers performed recently on 8/3 showing midgrade 50-69% stenosis bilaterally, will not repeat. -We'll upgrade aspirin to Plavix for secondary stroke prevention. -Therapy consultations requested. -Monitor for arrhythmias on telemetry  Type 2 diabetes -Check A1c, place on moderate sliding scale insulin.  Hypertension -Well-controlled, continue home medications.  Hyperlipidemia -Continue statin.   DVT prophylaxis: Lovenox  Code Status: Full code  Family Communication: Patient only  Disposition Plan: To be determined, likely discharge home in 24 hours once stroke workup complete  Consults called: None  Admission status: Observation    Time Spent: 65 minutes  Lelon Frohlich MD [facility] Pager [phone]  If 7PM-7AM, please contact night-coverage [email or web address] Password [email or web address]  05/05/2016, 5:26 PM

## 2016-05-06 ENCOUNTER — Observation Stay (HOSPITAL_BASED_OUTPATIENT_CLINIC_OR_DEPARTMENT_OTHER): Payer: Medicare Other

## 2016-05-06 DIAGNOSIS — G459 Transient cerebral ischemic attack, unspecified: Secondary | ICD-10-CM | POA: Diagnosis not present

## 2016-05-06 DIAGNOSIS — G458 Other transient cerebral ischemic attacks and related syndromes: Secondary | ICD-10-CM | POA: Diagnosis not present

## 2016-05-06 LAB — LIPID PANEL
CHOLESTEROL: 127 mg/dL (ref 0–200)
HDL: 40 mg/dL — ABNORMAL LOW (ref >40)
LDL Cholesterol: 64 mg/dL (ref 0–99)
TRIGLYCERIDES: 117 mg/dL (ref <150)
Total CHOL/HDL Ratio: 3.2 RATIO
VLDL: 23 mg/dL (ref 0–40)

## 2016-05-06 LAB — ECHOCARDIOGRAM COMPLETE
Height: 63 in
Weight: 2400 oz

## 2016-05-06 MED ORDER — CLOPIDOGREL BISULFATE 75 MG PO TABS: 75.0000 mg | ORAL_TABLET | Freq: Every day | ORAL | 2 refills | Status: DC

## 2016-05-06 MED ORDER — ACETAMINOPHEN 325 MG PO TABS
650.0000 mg | ORAL_TABLET | Freq: Four times a day (QID) | ORAL | Status: DC | PRN
  Administered 2016-05-06: 650 mg via ORAL
  Filled 2016-05-06: qty 2

## 2016-05-06 MED ORDER — ALUM & MAG HYDROXIDE-SIMETH 200-200-20 MG/5ML PO SUSP
15.0000 mL | Freq: Four times a day (QID) | ORAL | Status: DC | PRN
  Administered 2016-05-06: 15 mL via ORAL
  Filled 2016-05-06: qty 30

## 2016-05-06 NOTE — Discharge Summary (Signed)
Physician Discharge Summary  [name] WRU:[id number] DOB: 05-22-1941 DOA: 05/05/2016  PCP: [name]., MD  Admit date: 05/05/2016 Discharge date: 05/06/2016  Time spent: 45 minutes  Recommendations for Outpatient Follow-up:  -Will be discharged home today. -Will need to follow up with PCP in 2 weeks for ECHO results that are still pending at time of DC.   Discharge Diagnoses:  Principal Problem:   TIA (transient ischemic attack) Active Problems:   CAD (coronary artery disease)   DM (diabetes mellitus) ([name])   Hypercholesteremia   Presence of stent in right coronary artery   HTN (hypertension)   Carotid stenosis, bilateral   Discharge Condition: Stable and improved  Filed Weights   05/05/16 0817 05/05/16 1245  Weight: 68 kg (150 lb) 68 kg (150 lb)    History of present illness:  [name] is a 75 y.o. female with history of bilateral mid grade carotid stenosis, hypertension, diabetes, coronary artery disease with a prior CVA in 2015 presents with the above complaints. She was last seen normal at 6 PM last evening when her son went to bring her dinner. She awoke this morning and went to take her dogs out and noticed that her speech was slurred, subsequently noticed some left arm numbness went to see her PCP who sent her to the hospital for evaluation. In the ED CT had has been performed without acute abnormality but remote infarcts. Labs are essentially within normal limits. Admission requested.  Hospital Course:   TIA -Symptoms resolved prior to admission. -ECHO pending on DC. -Carotid dopplers on 04/10/16: IMPRESSION: Left greater the right carotid atherosclerosis.  Moderate left ICA stenosis remains at 50- 69%  Minor right ICA narrowing, less than 50%  Patent antegrade vertebral flow bilaterally -ASA has been upgraded to plavix for secondary stroke prevention. -Cleared by PT to go home.  Rest of chronic conditions have been  stable.  Procedures:  None   Consultations:  None  Discharge Instructions  Discharge Instructions    Diet - low sodium heart healthy    Complete by:  As directed   Increase activity slowly    Complete by:  As directed       Medication List    STOP taking these medications   aspirin 325 MG tablet     TAKE these medications   acetaminophen 500 MG tablet Commonly known as:  TYLENOL Take 1,000-1,500 mg by mouth every 6 (six) hours as needed for moderate pain.   atorvastatin 40 MG tablet Commonly known as:  LIPITOR Take 40 mg by mouth daily. Reported on 12/17/2015   carvedilol 6.25 MG tablet Commonly known as:  COREG Take 6.25 mg by mouth 2 (two) times daily.   clopidogrel 75 MG tablet Commonly known as:  PLAVIX Take 1 tablet (75 mg total) by mouth daily.   isosorbide mononitrate 30 MG 24 hr tablet Commonly known as:  IMDUR Take 1 tablet by mouth daily.   omeprazole 20 MG capsule Commonly known as:  PRILOSEC Take 1 capsule (20 mg total) by mouth 2 (two) times daily before a meal.   ondansetron 8 MG tablet Commonly known as:  ZOFRAN Take 1 tablet (8 mg total) by mouth every 8 (eight) hours as needed for nausea or vomiting.   zolpidem 10 MG tablet Commonly known as:  AMBIEN Take 10 mg by mouth at bedtime.      Allergies  Allergen Reactions  . Tape Rash    ADHESIVE TAPE   Follow-up  Information    [name],Barbara J., MD. Schedule an appointment as soon as possible for a visit in 2 week(s).   Specialty:  Internal Medicine Contact information: [address] [place] [address] [phone]            The results of significant diagnostics from this hospitalization (including imaging, microbiology, ancillary and laboratory) are listed below for reference.    Significant Diagnostic Studies: Ct Head Wo Contrast  Result Date: 05/05/2016 CLINICAL DATA:  75 year old female with acute slurred speech and left facial droop today. EXAM: CT HEAD  WITHOUT CONTRAST TECHNIQUE: Contiguous axial images were obtained from the base of the skull through the vertex without intravenous contrast. COMPARISON:  05/12/2014 CT FINDINGS: Brain: Mild generalized cerebral volume loss noted. Remote infarcts in the right frontal parietal region and bilateral basal ganglia again noted. No acute intracranial abnormalities are identified, including mass lesion or mass effect, hydrocephalus, extra-axial fluid collection, midline shift, hemorrhage, or acute infarction. The visualized bony calvarium is unremarkable. Vascular: Mild intracranial vascular calcifications noted. Skull: No acute or suspicious abnormality. Sinuses/Orbits: Unremarkable Other: None. IMPRESSION: No evidence of acute intracranial abnormality. Remote infarcts in the right frontoparietal region and bilateral basal ganglia. Electronically Signed   By: Margarette Canada M.D.   On: 05/05/2016 09:23   Mr Jodene Nam Head Wo Contrast  Result Date: 05/05/2016 CLINICAL DATA:  74 y/o F; slurred speech and left-sided weakness for 1 day. EXAM: MRA HEAD WITHOUT CONTRAST TECHNIQUE: Angiographic images of the Circle of Willis were obtained using MRA technique without intravenous contrast. COMPARISON:  Concurrent MRI of brain.  MRA of head 03/06/2014. FINDINGS: Internal carotid arteries: Patent. Mild irregularity of distal cavernous and paraclinoid segments without significant stenosis probably represents intracranial atherosclerosis. Anterior cerebral arteries:  Patent. Middle cerebral arteries: Patent. Anterior communicating artery: Possible diminutive A-comm identified. Posterior communicating arteries: Not identified, hypoplastic or absent. Posterior cerebral arteries: Patent. Stable proximal right P2 segment of moderate stenosis likely due to atherosclerosis. Basilar artery:  Patent. Vertebral arteries:  Patent. IMPRESSION: No large vessel occlusion, aneurysm, or high-grade stenosis is identified. Stable findings of intracranial  atherosclerosis. Electronically Signed   By: Kristine Garbe M.D.   On: 05/05/2016 20:22   Mr Brain Wo Contrast  Result Date: 05/05/2016 CLINICAL DATA:  75 y/o F; slurred speech and left-sided weakness for 1 day. EXAM: MRI HEAD WITHOUT CONTRAST TECHNIQUE: Multiplanar, multiecho pulse sequences of the brain and surrounding structures were obtained without intravenous contrast. COMPARISON:  MRI of brain and MRA head 03/06/2014. CT head 05/05/2016. Concurrent MRA of head. FINDINGS: Brain: No diffusion restriction to suggest acute infarct. No abnormal susceptibility hypointensity to indicate intracranial hemorrhage. Small lacunar infarcts in the right cerebellar hemisphere. Probable lacunar infarcts versus prominent perivascular spaces of the right lentiform nucleus, left thalamus, and left lentiform nucleus. Right frontal operculum encephalomalacia compatible with prior infarct seen on prior MRI. Extra-axial space: No hydrocephalus. No extra-axial collection is identified. Other: No abnormal signal of the paranasal sinuses. Left-greater-than-right mild opacification of mastoid air cells. Bilateral intra-ocular lens replacement Calvarium is unremarkable. IMPRESSION: 1. No acute intracranial abnormality is identified. 2. Chronic right frontal operculum infarct and small lacunar infarcts. Electronically Signed   By: Kristine Garbe M.D.   On: 05/05/2016 20:17   US Carotid Duplex Bilateral  Result Date: 04/10/2016 CLINICAL DATA:  Bilateral carotid atherosclerosis, hypertension, hyperlipidemia, diabetes and former smoker. EXAM: BILATERAL CAROTID DUPLEX ULTRASOUND TECHNIQUE: Pearline Cables scale imaging, color Doppler and duplex ultrasound were performed of bilateral carotid and vertebral arteries in the  neck. COMPARISON:  04/16/2015 FINDINGS: Criteria: Quantification of carotid stenosis is based on velocity parameters that correlate the residual internal carotid diameter with NASCET-based stenosis levels,  using the diameter of the distal internal carotid lumen as the denominator for stenosis measurement. The following velocity measurements were obtained: RIGHT ICA:  148/26 cm/sec CCA:  791/50 cm/sec SYSTOLIC ICA/CCA RATIO:  1.3 DIASTOLIC ICA/CCA RATIO:  1.6 ECA:  278 cm/sec LEFT ICA:  210/31 cm/sec CCA:  413/64 cm/sec SYSTOLIC ICA/CCA RATIO:  1.9 DIASTOLIC ICA/CCA RATIO:  2.0 ECA:  208 cm/sec RIGHT CAROTID ARTERY: Similar heterogeneous scattered right carotid system atherosclerosis with scattered calcification. Despite this, there is no hemodynamically significant right ICA stenosis, velocity elevation, or turbulent flow. Degree of narrowing remains less than 50%. RIGHT VERTEBRAL ARTERY:  Antegrade LEFT CAROTID ARTERY: More pronounced left carotid bifurcation atherosclerosis with partial calcification and echogenic shadowing. This narrows the carotid bifurcation by grayscale imaging. Proximal ICA velocity elevation measures 210/31 cm/sec. By ultrasound criteria, estimated left ICA stenosis remains 50- 69%. LEFT VERTEBRAL ARTERY:  Antegrade IMPRESSION: Left greater the right carotid atherosclerosis. Moderate left ICA stenosis remains at 50- 69% Minor right ICA narrowing, less than 50% Patent antegrade vertebral flow bilaterally Electronically Signed   By: Jerilynn Mages.  Shick M.D.   On: 04/10/2016 12:15    Microbiology: No results found for this or any previous visit (from the past 240 hour(s)).   Labs: Basic Metabolic Panel:  Recent Labs Lab 05/05/16 0826 05/05/16 0846  NA 141 142  K 3.8 3.7  CL 104 104  CO2 26  --   GLUCOSE 111* 102*  BUN 12 11  CREATININE 0.92 0.90  CALCIUM 9.2  --    Liver Function Tests:  Recent Labs Lab 05/05/16 0826  AST 40  ALT 30  ALKPHOS 108  BILITOT 0.8  PROT 6.6  ALBUMIN 3.8   No results for input(s): LIPASE, AMYLASE in the last 168 hours. No results for input(s): AMMONIA in the last 168 hours. CBC:  Recent Labs Lab 05/05/16 0826 05/05/16 0846  WBC 5.2  --     NEUTROABS 3.2  --   HGB 11.3* 11.6*  HCT 35.0* 34.0*  MCV 89.3  --   PLT 130*  --    Cardiac Enzymes:  Recent Labs Lab 05/05/16 0826  TROPONINI <0.03   BNP: BNP (last 3 results) No results for input(s): BNP in the last 8760 hours.  ProBNP (last 3 results) No results for input(s): PROBNP in the last 8760 hours.  CBG:  Recent Labs Lab 05/05/16 0822  GLUCAP 102*       Signed:  Lelon Frohlich  [facility] Pager: [phone] 05/06/2016, 6:16 PM

## 2016-05-06 NOTE — Progress Notes (Signed)
Discharged PT per MD order and protocol. Discharge handouts reviewed/explained. Education completed.  Pt verbalized understanding and left with all belongings. VSS. IV catheter D/C.  Patient wheeled down by staff member.

## 2016-05-06 NOTE — Evaluation (Signed)
Occupational Therapy Evaluation Patient Details Name: [name] MRN: [id number] DOB: [birth year] Today's Date: 05/06/2016    History of Present Illness [name] is a 75 y.o. female with history of bilateral mid grade carotid stenosis, hypertension, diabetes, coronary artery disease with a prior CVA in 2015 presents with the above complaints. She was last seen normal at 6 PM last evening when her son went to bring her dinner. She awoke this morning and went to take her dogs out and noticed that her speech was slurred, subsequently noticed some left arm numbness went to see her PCP who sent her to the hospital for evaluation. In the ED CT had has been performed without acute abnormality but remote infarcts.   Clinical Impression   Pt awake, alert, oriented x4 this am, agreeable to OT evaluation. Pt reports feeling returned to her baseline with exception of slight speech deficit, numbness is resolved. Pt demonstrates modified independence with ADL tasks, BUE strength is WNL, sensation and coordination are intact. No further OT services required at this time.     Follow Up Recommendations  No OT follow up    Equipment Recommendations  None recommended by OT        Precautions / Restrictions Precautions Precautions: None Restrictions Weight Bearing Restrictions: No      Mobility Bed Mobility Overal bed mobility: Independent                Transfers Overall transfer level: Independent Equipment used: None                        ADL Overall ADL's : Modified independent;At baseline                     Lower Body Dressing: Modified independent;Sitting/lateral leans   Toilet Transfer: Modified Independent;Comfort height toilet   Toileting- Clothing Manipulation and Hygiene: Modified independent;Sit to/from stand       Functional mobility during ADLs: Modified independent       Vision Vision Assessment?: No apparent visual deficits           Pertinent Vitals/Pain Pain Assessment: No/denies pain     Hand Dominance Right   Extremity/Trunk Assessment Upper Extremity Assessment Upper Extremity Assessment: Overall WFL for tasks assessed   Lower Extremity Assessment Lower Extremity Assessment: Defer to PT evaluation   Cervical / Trunk Assessment Cervical / Trunk Assessment: Normal   Communication Communication Communication: No difficulties   Cognition Arousal/Alertness: Awake/alert Behavior During Therapy: WFL for tasks assessed/performed Overall Cognitive Status: Within Functional Limits for tasks assessed                                 Home Living Family/patient expects to be discharged to:: Private residence Living Arrangements: Alone Available Help at Discharge: Family;Available PRN/intermittently               Bathroom Shower/Tub: Teacher, early years/pre: Standard     Home Equipment: None          Prior Functioning/Environment Level of Independence: Independent             OT Diagnosis: Other (comment) (hemiparesthesia )     End of Session    Activity Tolerance: Patient tolerated treatment well Patient left: in bed;with call bell/phone within reach   Time: 0825-0840 OT Time Calculation (min): 15 min Charges:  OT General Charges $OT Visit:  1 Procedure OT Evaluation $OT Eval Low Complexity: 1 Procedure G-Codes: OT G-codes **NOT FOR INPATIENT CLASS** Functional Assessment Tool Used: clinical judgement Functional Limitation: Self care Self Care Current Status (H6073): 0 percent impaired, limited or restricted Self Care Goal Status (X1062): 0 percent impaired, limited or restricted Self Care Discharge Status (I9485): 0 percent impaired, limited or restricted   Guadelupe Sabin, OTR/L  [phone] 05/06/2016, 8:44 AM

## 2016-05-06 NOTE — Care Management Obs Status (Signed)
[facility] NOTIFICATION   Patient Details  Name: PATRECIA VEIGA MRN: [id number] Date of Birth: Aug 14, 1941   Medicare Observation Status Notification Given:  Yes    Bliss Behnke, Chauncey Reading, RN 05/06/2016, 2:23 PM

## 2016-05-06 NOTE — Progress Notes (Signed)
*  PRELIMINARY RESULTS* Echocardiogram 2D Echocardiogram has been performed.  Leavy Cella 05/06/2016, 3:12 PM

## 2016-05-06 NOTE — Evaluation (Signed)
Physical Therapy Evaluation Patient Details Name: [name] MRN: [id number] DOB: January 04, 1941 Today's Date: 05/06/2016   History of Present Illness  75 y.o. female with history of bilateral mid grade carotid stenosis, hypertension, diabetes, coronary artery disease with a prior CVA in 2015 presents with the above complaints. She was last seen normal at 6 PM last evening when her son went to bring her dinner. She awoke this morning and went to take her dogs out and noticed that her speech was slurred, subsequently noticed some left arm numbness went to see her PCP who sent her to the hospital for evaluation. In the ED CT had has been performed without acute abnormality but remote infarcts.  PMH: Anemia, aortic insufficiency, aortic sclerosis, back pain, carotid stenosis, cirrhosis, CAD, CVA in 2015, DDD, hiatal hernia, DM, HTN, MI, Non-alcoholic fatty liver, OSA, stent in R coronary artery, esophageal varices, appendectomy, back surgery, ERCP, R wrist fx with removal of hardware in 2014  Clinical Impression  Pt received up, ambulating in the room, and was agreeable to PT evaluation.  Pt states that she is independent with all functional mobility at baseline.  During today's PT evaluation, she demonstrated ability to perform all functional mobility at independent level.  She does not demonstrate need for skilled PT at this time, will sign off.     Follow Up Recommendations No PT follow up    Equipment Recommendations  None recommended by PT    Recommendations for Other Services       Precautions / Restrictions Precautions Precautions: None Precaution Comments: slipped going into the kitchen - about 6 weeks ago - bruise on buttocks.  Restrictions Weight Bearing Restrictions: No      Mobility  Bed Mobility Overal bed mobility: Independent                Transfers Overall transfer level: Independent Equipment used: None                Ambulation/Gait Ambulation/Gait  assistance: Independent Ambulation Distance (Feet): 300 Feet Assistive device: None Gait Pattern/deviations: WFL(Within Functional Limits)        Stairs Stairs: Yes Stairs assistance: Modified independent (Device/Increase time) Stair Management: One rail Left;Alternating pattern;Step to pattern;Forwards Number of Stairs: 4    Wheelchair Mobility    Modified Rankin (Stroke Patients Only)       Balance Overall balance assessment: Needs assistance                   Tandem Stance - Right Leg: 30 (Requires assistance to obtain position) Tandem Stance - Left Leg: 30 (Requires assistance to obtain position)     High level balance activites: Head turns               Pertinent Vitals/Pain Pain Assessment: 0-10 Pain Score: 4  Pain Location: R wrist - from where she broke it previously.  Pain Descriptors / Indicators: Constant;Aching Pain Intervention(s): Limited activity within patient's tolerance;Monitored during session    [place] expects to be discharged to:: Private residence Living Arrangements: Alone Available Help at Discharge: Family;Available PRN/intermittently (son lives across the street, and her granddaughters. - girls vaccume and dust) Type of Home: House Home Access: Stairs to enter   [employer] of Steps: 3 steps to the porch on the front, and the back door is the basement steps to go up into the house.  Home Layout: Laundry or work area in basement;One level Home Equipment: [place] - 2 wheels (but does not  use it. )      Prior Function Level of Independence: Independent         Comments: Pt reports she was independent with ambulation, ADL's, IADL's, and driving.      Hand Dominance   Dominant Hand: Right    Extremity/Trunk Assessment   Upper Extremity Assessment: Overall WFL for tasks assessed           Lower Extremity Assessment: Overall WFL for tasks assessed      Cervical / Trunk  Assessment: Normal  Communication   Communication: No difficulties  Cognition Arousal/Alertness: Awake/alert Behavior During Therapy: WFL for tasks assessed/performed Overall Cognitive Status: Within Functional Limits for tasks assessed                      General Comments      Exercises        Assessment/Plan    PT Assessment Patent does not need any further PT services  PT Diagnosis Difficulty walking   PT Problem List    PT Treatment Interventions     PT Goals (Current goals can be found in the Care Plan section) Acute Rehab PT Goals PT Goal Formulation: All assessment and education complete, DC therapy    Frequency     Barriers to discharge        Co-evaluation               End of Session Equipment Utilized During Treatment: Gait belt Activity Tolerance: Patient tolerated treatment well Patient left: in chair      Functional Assessment Tool Used: [place] "6-clicks"  Functional Limitation: Mobility: Walking and moving around Mobility: Walking and Moving Around Current Status [phone]): 0 percent impaired, limited or restricted Mobility: Walking and Moving Around Goal Status [phone]): 0 percent impaired, limited or restricted Mobility: Walking and Moving Around Discharge Status [phone]): 0 percent impaired, limited or restricted    Time: 0940-1000 PT Time Calculation (min) (ACUTE ONLY): 20 min   Charges:   PT Evaluation $PT Eval Low Complexity: 1 Procedure     PT G Codes:   PT G-Codes **NOT FOR INPATIENT CLASS** Functional Assessment Tool Used: [employer] "6-clicks"  Functional Limitation: Mobility: Walking and moving around Mobility: Walking and Moving Around Current Status [phone]): 0 percent impaired, limited or restricted Mobility: Walking and Moving Around Goal Status [phone]): 0 percent impaired, limited or restricted Mobility: Walking and Moving Around Discharge Status [phone]): 0 percent impaired, limited  or restricted   Beth Zoe Nordin, PT, DPT X: [id number]

## 2016-05-06 NOTE — Care Management Note (Signed)
Case Management Note  Patient Details  Name: [name] MRN: [id number] Date of Birth: [birth year]  Subjective/Objective: Patient is from home alone, has a son that lives close by her. She is independent, still drives to her appointments. She goes to [facility] for primary care. She has insurance, reports no issues.                    Action/Plan: Anticipate DC home with self care. No CM needs.   Expected Discharge Date:  05/07/16               Expected Discharge Plan:  Home/Self Care  In-House Referral:  NA  Discharge planning Services  CM Consult  Post Acute Care Choice:  NA Choice offered to:  NA  DME Arranged:    DME Agency:     HH Arranged:    HH Agency:     Status of Service:  Completed, signed off  If discussed at [employer] of Stay Meetings, dates discussed:    Additional Comments:  Kapri Nero, Chauncey Reading, RN 05/06/2016, 2:21 PM

## 2016-05-06 NOTE — Progress Notes (Signed)
Pt currently NPO. Passed swallow screen in ED. MD notified and aware. Gave verbal order for a Heart Healthy diet. Oswald Hillock, RN

## 2016-05-07 LAB — HEMOGLOBIN A1C
HEMOGLOBIN A1C: 5.2 % (ref 4.8–5.6)
Mean Plasma Glucose: 103 mg/dL

## 2016-05-09 DIAGNOSIS — I10 Essential (primary) hypertension: Secondary | ICD-10-CM | POA: Diagnosis not present

## 2016-05-09 DIAGNOSIS — D692 Other nonthrombocytopenic purpura: Secondary | ICD-10-CM | POA: Diagnosis not present

## 2016-05-09 DIAGNOSIS — Z1389 Encounter for screening for other disorder: Secondary | ICD-10-CM | POA: Diagnosis not present

## 2016-05-09 DIAGNOSIS — I6523 Occlusion and stenosis of bilateral carotid arteries: Secondary | ICD-10-CM | POA: Diagnosis not present

## 2016-05-13 ENCOUNTER — Telehealth (INDEPENDENT_AMBULATORY_CARE_PROVIDER_SITE_OTHER): Payer: Self-pay | Admitting: Internal Medicine

## 2016-05-13 NOTE — Telephone Encounter (Signed)
Patient called, would like a refill on Zofran called to [facility].  [phone]

## 2016-05-14 ENCOUNTER — Telehealth (INDEPENDENT_AMBULATORY_CARE_PROVIDER_SITE_OTHER): Payer: Self-pay | Admitting: Internal Medicine

## 2016-05-14 DIAGNOSIS — R110 Nausea: Secondary | ICD-10-CM

## 2016-05-14 MED ORDER — ONDANSETRON 8 MG PO TBDP: 8.0000 mg | ORAL_TABLET | Freq: Three times a day (TID) | ORAL | 0 refills | Status: DC | PRN

## 2016-05-14 NOTE — Telephone Encounter (Signed)
Called patient and advised

## 2016-05-14 NOTE — Telephone Encounter (Signed)
Rx for Zofran sent to her pharmacy

## 2016-05-14 NOTE — Telephone Encounter (Signed)
Please let patient know I sent Rx in

## 2016-05-16 DIAGNOSIS — B351 Tinea unguium: Secondary | ICD-10-CM | POA: Diagnosis not present

## 2016-05-16 DIAGNOSIS — E1142 Type 2 diabetes mellitus with diabetic polyneuropathy: Secondary | ICD-10-CM | POA: Diagnosis not present

## 2016-05-16 DIAGNOSIS — L851 Acquired keratosis [keratoderma] palmaris et plantaris: Secondary | ICD-10-CM | POA: Diagnosis not present

## 2016-06-17 ENCOUNTER — Encounter (INDEPENDENT_AMBULATORY_CARE_PROVIDER_SITE_OTHER): Payer: Self-pay | Admitting: *Deleted

## 2016-06-17 ENCOUNTER — Ambulatory Visit (INDEPENDENT_AMBULATORY_CARE_PROVIDER_SITE_OTHER): Payer: Medicare Other | Admitting: Internal Medicine

## 2016-06-17 ENCOUNTER — Encounter (INDEPENDENT_AMBULATORY_CARE_PROVIDER_SITE_OTHER): Payer: Self-pay | Admitting: Internal Medicine

## 2016-06-17 VITALS — BP 142/50 | HR 64 | Temp 98.1°F | Ht 62.0 in | Wt 161.1 lb

## 2016-06-17 DIAGNOSIS — K7469 Other cirrhosis of liver: Secondary | ICD-10-CM

## 2016-06-17 NOTE — Progress Notes (Signed)
Subjective:    Patient ID: [name], female    DOB: 05/18/1941, 75 y.o.   MRN: [id number]  HPI Here today for f/u of he NAFLD. She was last seen in July of this year. Her weight was 152. Ht 62 in.  Her appetite is better. She has gained almost 10 pounds since her visit in July. She has nausea in the evening and takes Zofran as needed. She says she alternates between constipation and diarrhea. No melena or BRRB.  She has a BM daily. She also c/o bath breath.  Last esophageal banding was in 2015 Hemoglobin in August was 11.06 Denies any periods of confusion. States Dr. Gerarda Fraction is referring her to [facility] to have her carotids evaluated.   06/21/2014: Procedure: EGD with esophageal variceal banding.  Indications: Patient is 75 year old Caucasian female with cirrhosis secondary to half old complicated by esophageal variceal bleed last year. She is now returning for EGD and banding for recurrent varices. She was hospitalized about 3 months ago in order to have heme positive stool and low hemoglobin but there is no history of melena or rectal bleeding. Hemoglobin 7 weeks ago was up to 11.8 g. Patient is on low-dose aspirin because of history of CVA(4 months ago)  Impression: Two columns of esophageal varices were banded. Portal gastropath Gastric antral vascular ectasia without stigmata of bleed.    CBC    Component Value Date/Time   WBC 5.2 05/05/2016 0826   RBC 3.92 05/05/2016 0826   HGB 11.6 (L) 05/05/2016 0846   HCT 34.0 (L) 05/05/2016 0846   PLT 130 (L) 05/05/2016 0826   MCV 89.3 05/05/2016 0826   MCH 28.8 05/05/2016 0826   MCHC 32.3 05/05/2016 0826   RDW 14.1 05/05/2016 0826   LYMPHSABS 1.3 05/05/2016 0826   MONOABS 0.5 05/05/2016 0826   EOSABS 0.2 05/05/2016 0826   BASOSABS 0.0 05/05/2016 0826      Hepatic Function Latest Ref Rng & Units 05/05/2016 02/08/2016 12/25/2015  Total Protein 6.5 - 8.1 g/dL 6.6 6.9 6.8  Albumin 3.5 - 5.0 g/dL  3.8 4.0 3.9  AST 15 - 41 U/L 40 28 26  ALT 14 - 54 U/L 30 26 25   Alk Phosphatase 38 - 126 U/L 108 101 93  Total Bilirubin 0.3 - 1.2 mg/dL 0.8 0.6 0.7  Bilirubin, Direct <=0.2 mg/dL - - -   12/19/2015 US abdomen complete: cirrhosis  IMPRESSION: 1. Liver echotexture is coarse and nodular consistent with patient's known cirrhosis.  2. No gallstones noted. However tiny gallstones were noted on prior CT of 07/09/2015. Common bile duct is slightly prominent 7.1 mm. No obstructing lesion is identified. Correlate with LFTs.  Hx of CVA and maintained on Plavix Review of Systems Past Medical History:  Diagnosis Date  . Anemia   . Aortic insufficiency 07/04/2011   most recent 2D Echo 02/24/2012  EF greater than 55%  . Aortic sclerosis 07/04/2011  . Back pain   . Carotid stenosis, bilateral 07/04/2011   most recent 05/2010 carotid doppler  . Cirrhosis ([place])   . Coronary artery disease   . CVA (cerebral infarction)    03/07/2014  . DDD (degenerative disc disease), lumbar   . Diabetes mellitus 2005  . GERD (gastroesophageal reflux disease)   . GERD (gastroesophageal reflux disease)   . Hiatal hernia   . HTN (hypertension) 07/04/2011  . Hypercholesteremia   . IBS (irritable bowel syndrome)   . MI (myocardial infarction)   . Myocardial infarction 1995  .  Non-alcoholic fatty liver disease   . OSA (obstructive sleep apnea) 07/04/2011   sleep study 2009  AHI 9.91/hr and during REM sleep 35.58/hr  . Presence of stent in right coronary artery 07/04/2011  . Stroke ([place]) 03/07/2014   no deficits from stroke  . UTI (lower urinary tract infection)    chronic/recurring/on bactrim  . Varices, esophageal ([place])     Past Surgical History:  Procedure Laterality Date  . [place]  . APPENDECTOMY    . BACK SURGERY  1985  . CARDIAC CATHETERIZATION  1995.,2006,1997   stent to the proximal RCA after MI   . CATARACT EXTRACTION W/PHACO Right 07/04/2014   Procedure: CATARACT  EXTRACTION PHACO AND INTRAOCULAR LENS PLACEMENT (IOC);  Surgeon: Elta Guadeloupe T. Gershon Crane, MD;  Location: [facility];  Service: Ophthalmology;  Laterality: Right;  CDE:13.13  . COLONOSCOPY    . DILATION AND CURETTAGE OF UTERUS     x2  . Epi Retinal Membrane Peel Left   . ERCP    . ESOPHAGEAL BANDING N/A 04/01/2013   Procedure: ESOPHAGEAL BANDING;  Surgeon: Rogene Houston, MD;  Location: [facility];  Service: Endoscopy;  Laterality: N/A;  . ESOPHAGEAL BANDING N/A 05/24/2013   Procedure: ESOPHAGEAL BANDING;  Surgeon: Rogene Houston, MD;  Location: [facility];  Service: Endoscopy;  Laterality: N/A;  . ESOPHAGEAL BANDING N/A 06/21/2014   Procedure: ESOPHAGEAL BANDING;  Surgeon: Rogene Houston, MD;  Location: [facility];  Service: Endoscopy;  Laterality: N/A;  . ESOPHAGOGASTRODUODENOSCOPY N/A 04/01/2013   Procedure: ESOPHAGOGASTRODUODENOSCOPY (EGD);  Surgeon: Rogene Houston, MD;  Location: [facility];  Service: Endoscopy;  Laterality: N/A;  230-rescheduled to 8:30am Ann notified pt  . ESOPHAGOGASTRODUODENOSCOPY N/A 05/24/2013   Procedure: ESOPHAGOGASTRODUODENOSCOPY (EGD);  Surgeon: Rogene Houston, MD;  [facility]: [facility];  Service: Endoscopy;  Laterality: N/A;  730  . ESOPHAGOGASTRODUODENOSCOPY N/A 06/21/2014   Procedure: ESOPHAGOGASTRODUODENOSCOPY (EGD);  Surgeon: Rogene Houston, MD;  Location: [facility];  Service: Endoscopy;  Laterality: N/A;  930-rescheduled 10/14 @ 1200 Ann to notify pt  . EYE SURGERY  08   cataract surgery of the left eye  . HARDWARE REMOVAL Right 01/17/2013   Procedure: REMOVAL OF HARDWARE AND EXCISION ULNAR STYLOID RIGHT WRIST;  Surgeon: Tennis Must, MD;  Location: [facility];  Service: Orthopedics;  Laterality: Right;  . MYRINGOTOMY  2012   both ears  . TONSILLECTOMY    . VAGINAL HYSTERECTOMY  1972  . WRIST SURGERY     rt wrist hardwear removal    Allergies  Allergen Reactions  . Tape Rash    ADHESIVE TAPE    Current Outpatient  Prescriptions on File Prior to Visit  Medication Sig Dispense Refill  . acetaminophen (TYLENOL) 500 MG tablet Take 1,000-1,500 mg by mouth every 6 (six) hours as needed for moderate pain.    Marland Kitchen atorvastatin (LIPITOR) 40 MG tablet Take 40 mg by mouth daily. Reported on 12/17/2015    . carvedilol (COREG) 6.25 MG tablet Take 6.25 mg by mouth 2 (two) times daily.    . clopidogrel (PLAVIX) 75 MG tablet Take 1 tablet (75 mg total) by mouth daily. 30 tablet 2  . isosorbide mononitrate (IMDUR) 30 MG 24 hr tablet Take 1 tablet by mouth daily.    Marland Kitchen omeprazole (PRILOSEC) 20 MG capsule Take 1 capsule (20 mg total) by mouth 2 (two) times daily before a meal. 60 capsule 3  . ondansetron (ZOFRAN ODT) 8  MG disintegrating tablet Take 1 tablet (8 mg total) by mouth every 8 (eight) hours as needed for nausea or vomiting. 30 tablet 0  . zolpidem (AMBIEN) 10 MG tablet Take 10 mg by mouth at bedtime.    . [DISCONTINUED] promethazine (PHENERGAN) 12.5 MG tablet Take 1 tablet (12.5 mg total) by mouth every 6 (six) hours as needed for nausea or vomiting. (Patient not taking: Reported on 12/25/2015) 15 tablet 0   No current facility-administered medications on file prior to visit.        Objective:   Physical Exam Blood pressure (!) 142/50, pulse 64, temperature 98.1 F (36.7 C), height 5' 2"  (1.575 m), weight 161 lb 1.6 oz (73.1 kg).  Alert and oriented. Skin warm and dry. Oral mucosa is moist.   . Sclera anicteric, conjunctivae is pink. Thyroid not enlarged. No cervical lymphadenopathy. Lungs clear. Heart regular rate and rhythm.  Abdomen is soft. Bowel sounds are positive. No hepatomegaly. No abdominal masses felt. No tenderness.  No edema to lower extremities.         Assessment & Plan:  NAFLD.Marland Kitchen Am going to get   and [place] RUQ. OV in 6 months.  Liver function labs are normal.

## 2016-06-17 NOTE — Progress Notes (Signed)
  Weight in July was 152. Height 62.

## 2016-06-17 NOTE — Patient Instructions (Signed)
[place] RUQ.  OV in 6 months.

## 2016-06-19 ENCOUNTER — Encounter: Payer: Self-pay | Admitting: Vascular Surgery

## 2016-06-24 ENCOUNTER — Ambulatory Visit (HOSPITAL_COMMUNITY): Admission: RE | Admit: 2016-06-24 | Payer: Medicare Other | Source: Ambulatory Visit

## 2016-06-25 ENCOUNTER — Encounter: Payer: Medicare Other | Admitting: Vascular Surgery

## 2016-06-30 ENCOUNTER — Ambulatory Visit (HOSPITAL_COMMUNITY)
Admission: RE | Admit: 2016-06-30 | Discharge: 2016-06-30 | Disposition: A | Discharge status: Home / Self Care | Payer: Medicare Other | Source: Ambulatory Visit | Attending: Internal Medicine | Admitting: Internal Medicine

## 2016-06-30 DIAGNOSIS — K7469 Other cirrhosis of liver: Secondary | ICD-10-CM | POA: Insufficient documentation

## 2016-06-30 DIAGNOSIS — K7460 Unspecified cirrhosis of liver: Secondary | ICD-10-CM | POA: Diagnosis not present

## 2016-06-30 DIAGNOSIS — K8020 Calculus of gallbladder without cholecystitis without obstruction: Secondary | ICD-10-CM | POA: Insufficient documentation

## 2016-07-25 DIAGNOSIS — B351 Tinea unguium: Secondary | ICD-10-CM | POA: Diagnosis not present

## 2016-07-25 DIAGNOSIS — L851 Acquired keratosis [keratoderma] palmaris et plantaris: Secondary | ICD-10-CM | POA: Diagnosis not present

## 2016-07-25 DIAGNOSIS — E1142 Type 2 diabetes mellitus with diabetic polyneuropathy: Secondary | ICD-10-CM | POA: Diagnosis not present

## 2016-08-13 ENCOUNTER — Telehealth: Payer: Self-pay | Admitting: Cardiology

## 2016-08-13 NOTE — Telephone Encounter (Signed)
Please give pt a call--she's woken up twice w/ chest pains

## 2016-08-13 NOTE — Telephone Encounter (Signed)
Patient states that she has woke up the last two nights with her heart racing and pounding. Patient c/o nausea and denies chest pain. Patient encouraged to be seen in the ED for evaluation.

## 2016-08-13 NOTE — Telephone Encounter (Signed)
Agree with ER evaluation. If she is not willng to go then PA appointment this week, but encourage her to go to ER   Zandra Abts MD

## 2016-08-14 ENCOUNTER — Ambulatory Visit (INDEPENDENT_AMBULATORY_CARE_PROVIDER_SITE_OTHER): Payer: Medicare Other | Admitting: Otolaryngology

## 2016-08-14 DIAGNOSIS — H903 Sensorineural hearing loss, bilateral: Secondary | ICD-10-CM | POA: Diagnosis not present

## 2016-08-14 DIAGNOSIS — H7202 Central perforation of tympanic membrane, left ear: Secondary | ICD-10-CM | POA: Diagnosis not present

## 2016-08-14 DIAGNOSIS — H9072 Mixed conductive and sensorineural hearing loss, unilateral, left ear, with unrestricted hearing on the contralateral side: Secondary | ICD-10-CM | POA: Diagnosis not present

## 2016-08-30 IMAGING — CR DG CHEST 1V PORT
1 series · 1 of 1 positions shown · non-contrast
Comparison: 06/01/2015

CLINICAL DATA: Central chest pressure with nausea and diaphoresis.

EXAM:
PORTABLE CHEST 1 VIEW

[ap]
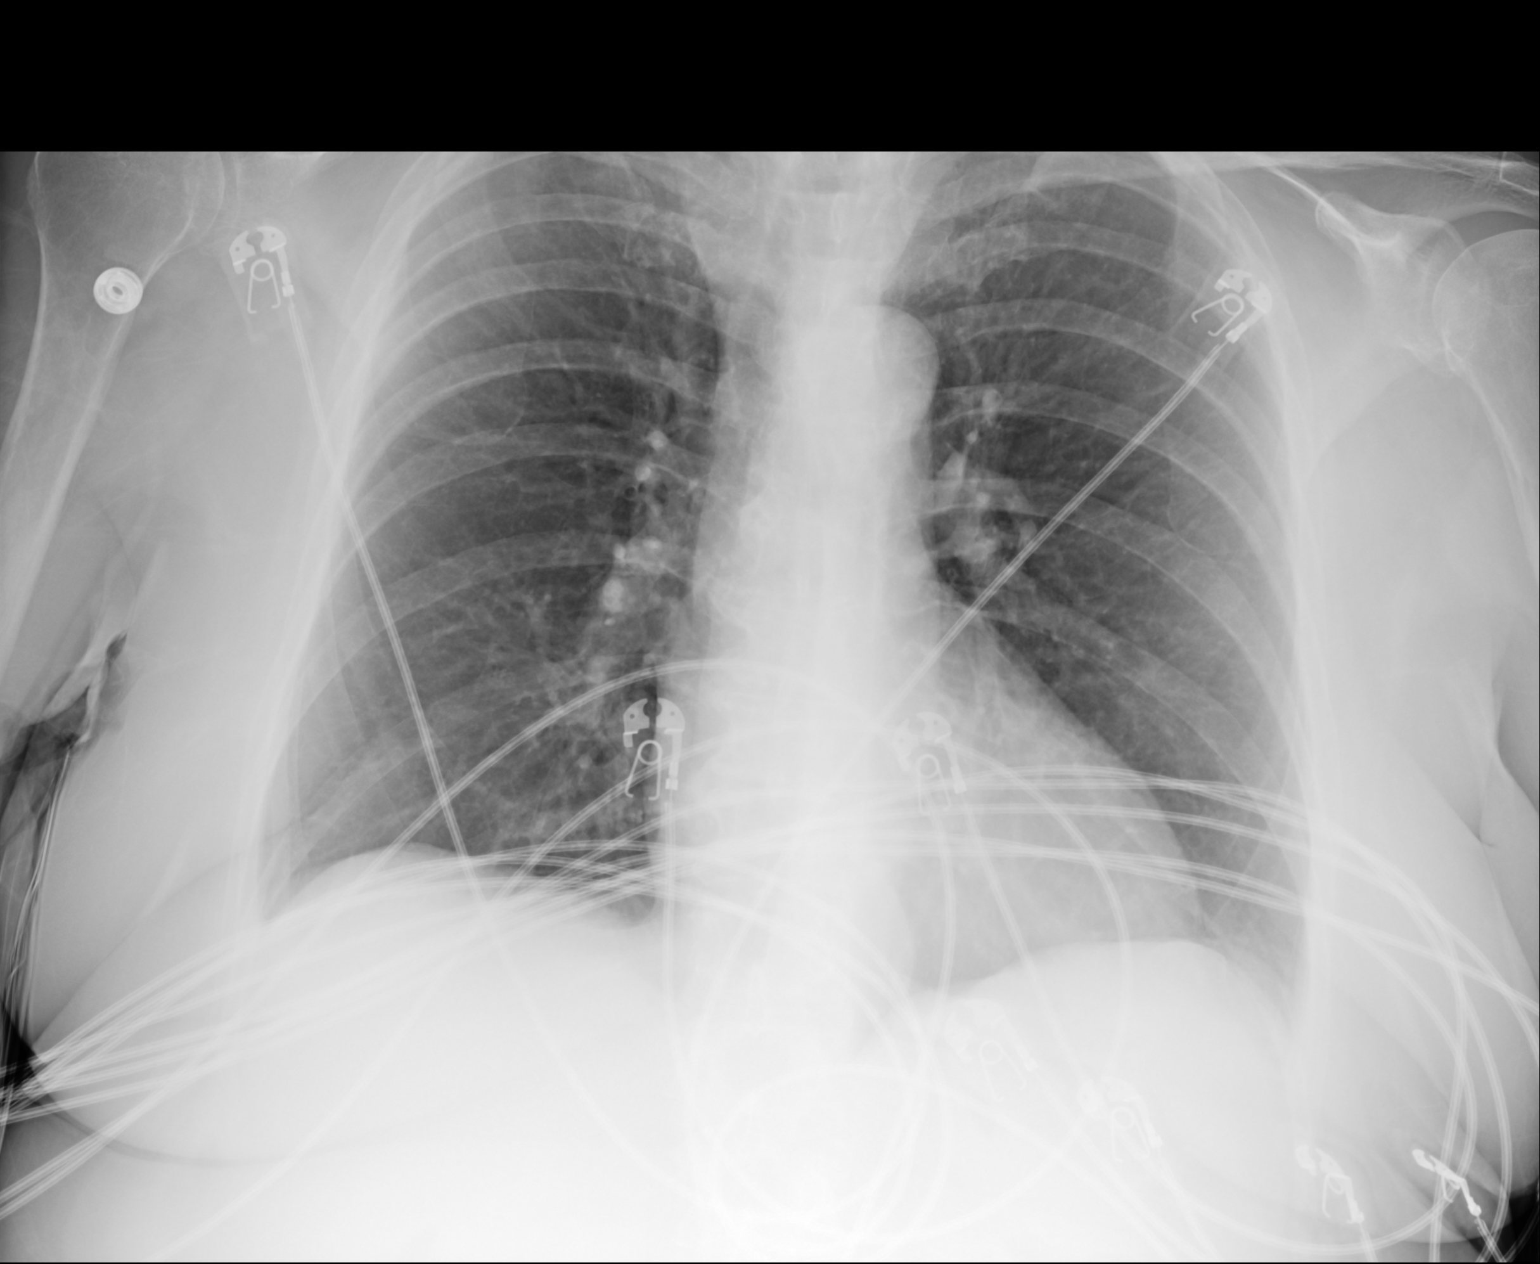

[1 of 1 positions shown; findings below may reference images not displayed]

FINDINGS: The cardiomediastinal contours are normal. Pulmonary vasculature is
normal. No consolidation, pleural effusion, or pneumothorax. No
acute osseous abnormalities are seen.
IMPRESSION: No acute pulmonary process.

## 2016-09-05 DIAGNOSIS — Z1389 Encounter for screening for other disorder: Secondary | ICD-10-CM | POA: Diagnosis not present

## 2016-09-05 DIAGNOSIS — G4700 Insomnia, unspecified: Secondary | ICD-10-CM | POA: Diagnosis not present

## 2016-09-05 DIAGNOSIS — E1165 Type 2 diabetes mellitus with hyperglycemia: Secondary | ICD-10-CM | POA: Diagnosis not present

## 2016-09-08 DIAGNOSIS — R569 Unspecified convulsions: Secondary | ICD-10-CM

## 2016-09-08 HISTORY — DX: Unspecified convulsions: R56.9

## 2016-09-11 ENCOUNTER — Telehealth (INDEPENDENT_AMBULATORY_CARE_PROVIDER_SITE_OTHER): Payer: Self-pay | Admitting: Internal Medicine

## 2016-09-11 DIAGNOSIS — F5089 Other specified eating disorder: Secondary | ICD-10-CM

## 2016-09-11 DIAGNOSIS — R110 Nausea: Secondary | ICD-10-CM

## 2016-09-11 MED ORDER — ONDANSETRON HCL 8 MG PO TABS: 8.0000 mg | ORAL_TABLET | Freq: Three times a day (TID) | ORAL | 2 refills | Status: DC | PRN

## 2016-09-11 NOTE — Telephone Encounter (Signed)
Rx sent to her pharmacy 

## 2016-09-11 NOTE — Telephone Encounter (Signed)
Hope, let her know I sent an Rx in

## 2016-09-11 NOTE — Telephone Encounter (Signed)
Patient called, [facility]  She has no refills.  [phone]

## 2016-09-15 IMAGING — US US CAROTID DUPLEX BILAT
1 series · 13 of 24 positions shown · non-contrast
Comparison: 03/06/2014 and 05/14/2010

CLINICAL DATA: Bilateral carotid stenoses and history of cerebral
infarction. History of hypertension, coronary artery disease,
hyperlipidemia and diabetes.

EXAM:
BILATERAL CAROTID DUPLEX ULTRASOUND
TECHNIQUE: Gray scale imaging, color Doppler and duplex ultrasound were
performed of bilateral carotid and vertebral arteries in the neck.

[Series 1: us carotid duplex bilat · 0.05mm/px · 13 of 68 slices shown]
[im 1/68]
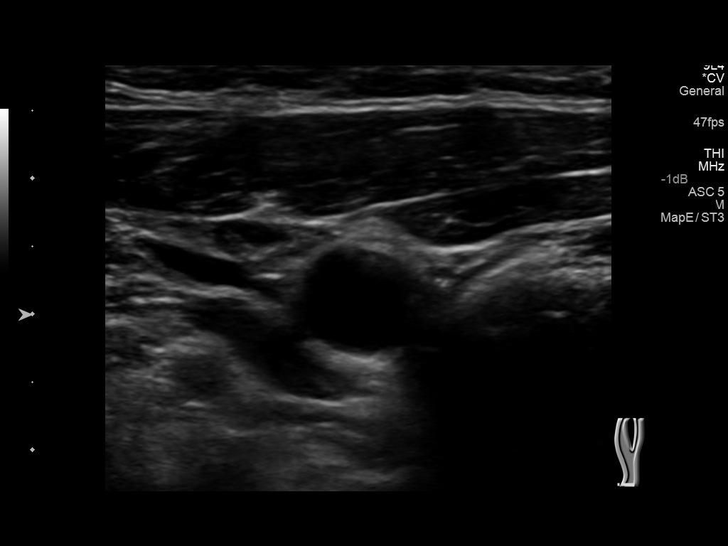
[im 6/68]
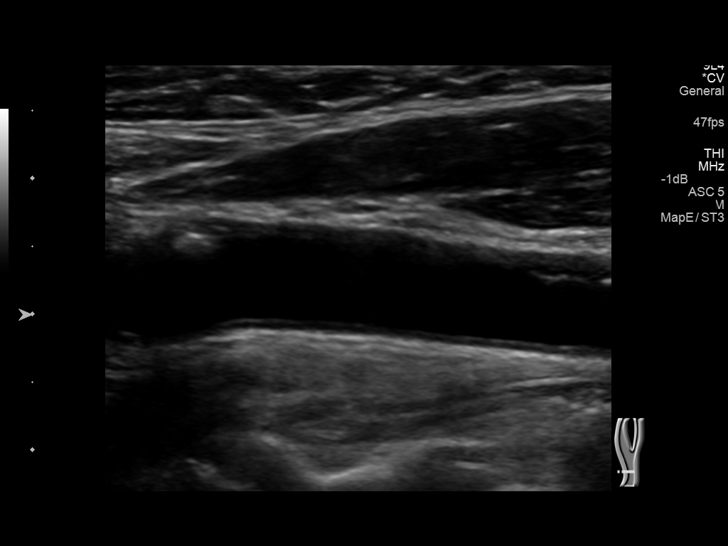
[im 12/68]
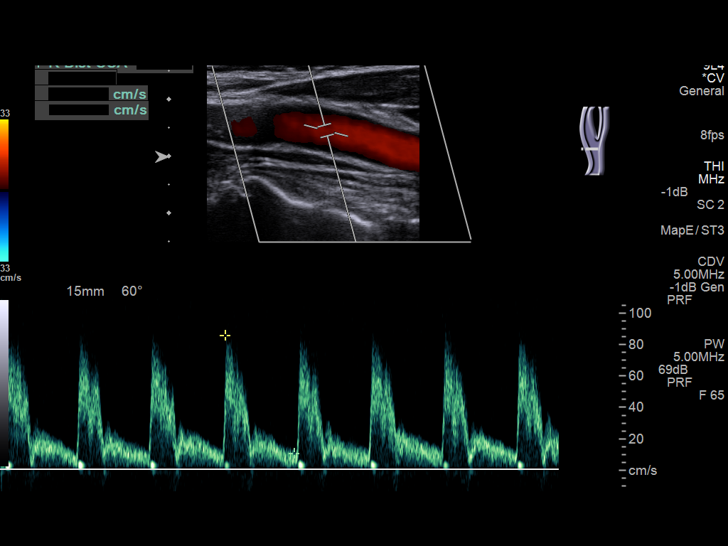
[im 18/68]
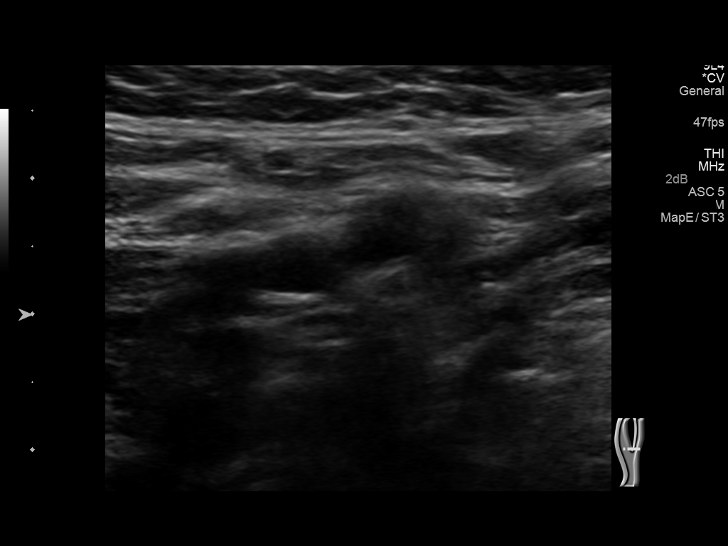
[im 24/68]
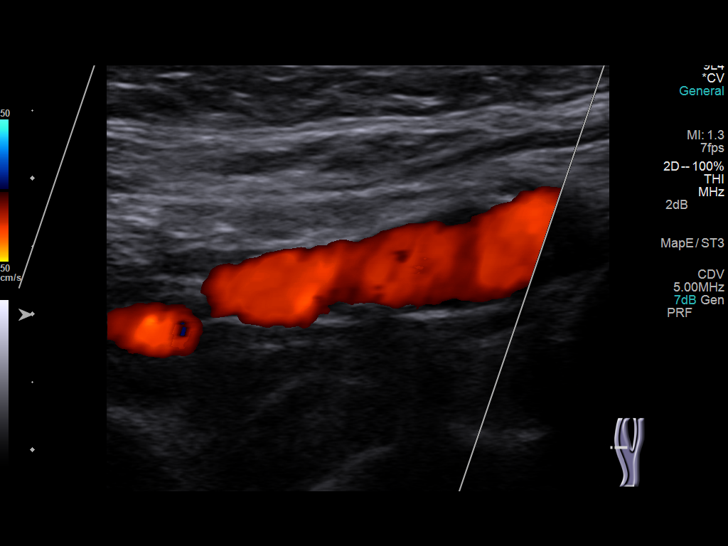
[im 30/68]
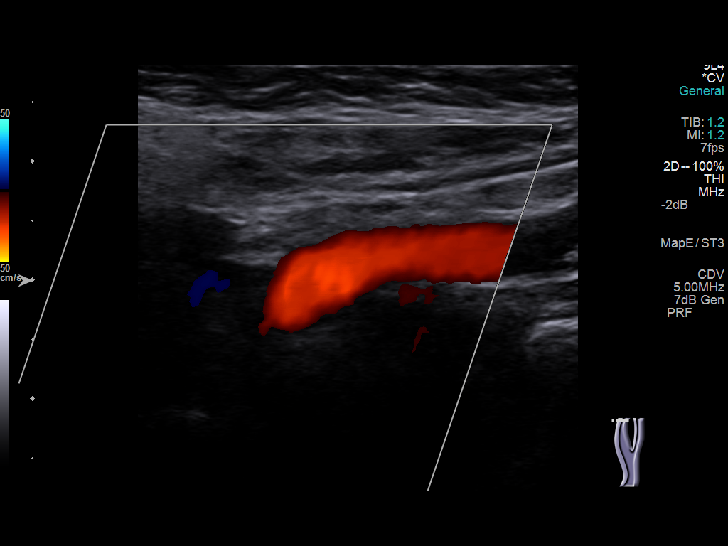
[im 35/68]
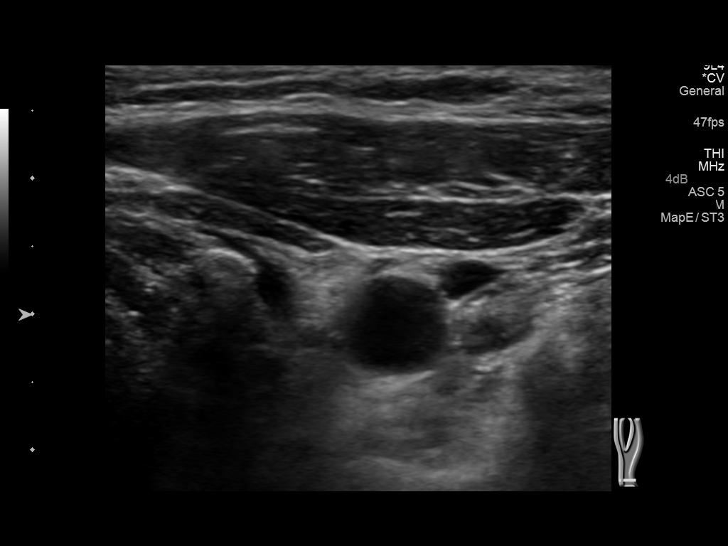
[im 38/68]
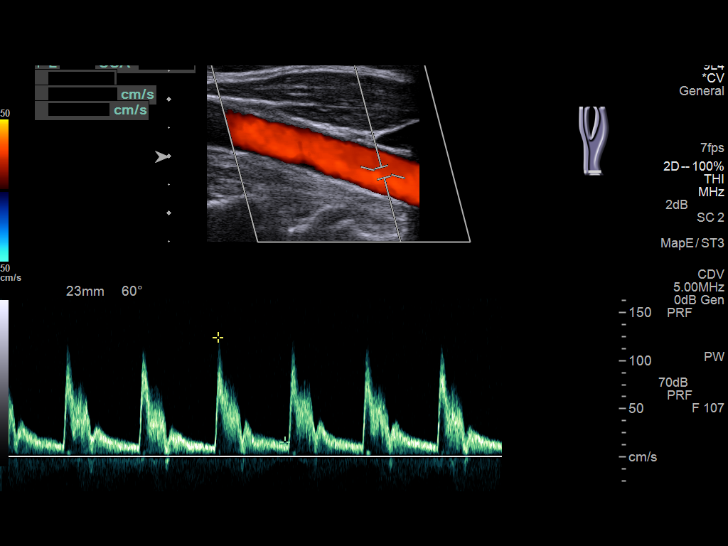
[im 44/68]
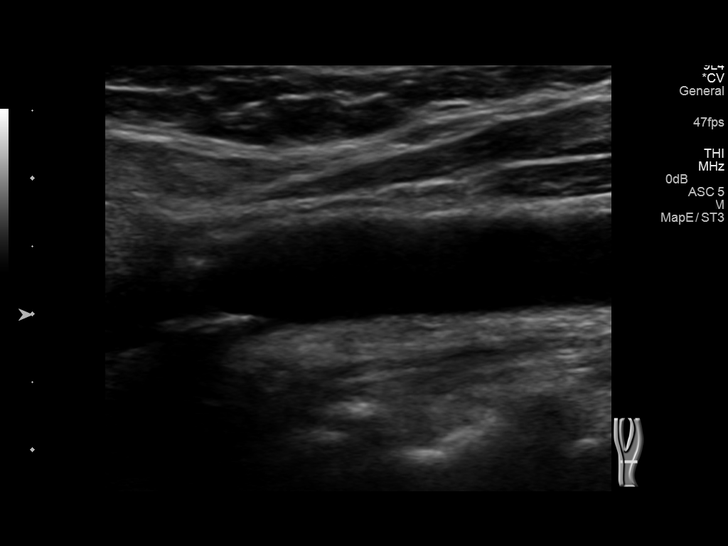
[im 50/68]
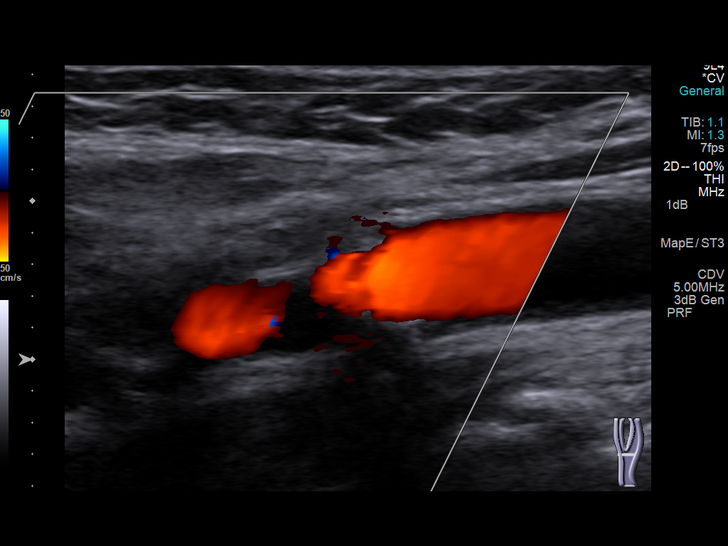
[im 56/68]
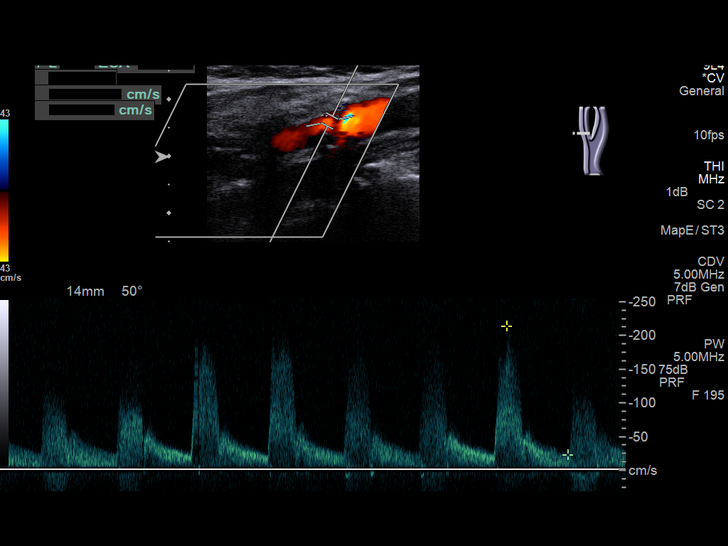
[im 62/68]
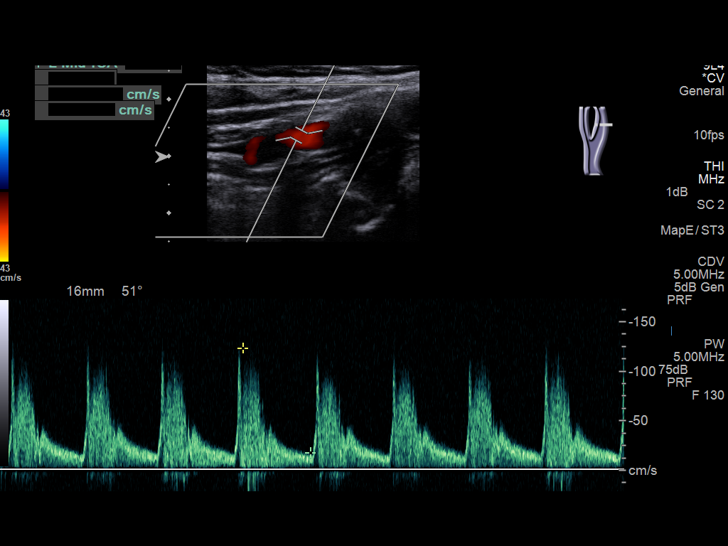
[im 68/68]
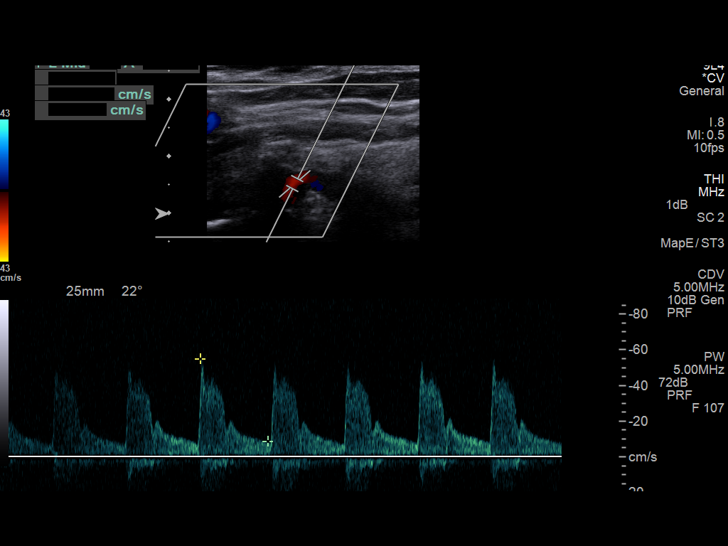

[13 of 24 positions shown; findings below may reference images not displayed]

FINDINGS: Criteria: Quantification of carotid stenosis is based on velocity
parameters that correlate the residual internal carotid diameter
with NASCET-based stenosis levels, using the diameter of the distal
internal carotid lumen as the denominator for stenosis measurement.

The following velocity measurements were obtained:

RIGHT

ICA:  119/15 cm/sec

CCA:  94/10 cm/sec

SYSTOLIC ICA/CCA RATIO:

DIASTOLIC ICA/CCA RATIO:

ECA:  214 cm/sec

LEFT

ICA:  202/27 cm/sec

CCA:  109/18 cm/sec

SYSTOLIC ICA/CCA RATIO:

DIASTOLIC ICA/CCA RATIO:

ECA:  214 cm/sec

RIGHT CAROTID ARTERY: Stable relatively mild amount of calcified
plaque at the level of the distal common carotid artery, carotid
bulb and proximal ICA. Estimated right ICA stenosis remains less
than 50%.

RIGHT VERTEBRAL ARTERY: Antegrade flow with normal waveform and
velocity.

LEFT CAROTID ARTERY: Stable sonographic appearance of moderate
calcified plaque at the level of the carotid bulb and proximal ICA.
Based on velocities, estimated left ICA stenosis remains 50- 69%.

LEFT VERTEBRAL ARTERY: Antegrade flow with normal waveform and
velocity.
IMPRESSION: Stable carotid duplex ultrasound demonstrating bilateral
atherosclerotic plaque, left greater than right, at the level of the
carotid bulbs and proximal internal carotid arteries. Estimated left
ICA stenosis remains 50- 69%. Estimated right ICA stenosis remains
less than 50%.

## 2016-09-23 ENCOUNTER — Encounter (HOSPITAL_COMMUNITY): Payer: Self-pay | Admitting: Emergency Medicine

## 2016-09-23 ENCOUNTER — Emergency Department (HOSPITAL_COMMUNITY): Payer: Medicare Other

## 2016-09-23 ENCOUNTER — Inpatient Hospital Stay (HOSPITAL_COMMUNITY)
Admission: EM | Admit: 2016-09-23 | Discharge: 2016-09-27 | DRG: 281 | Disposition: A | Discharge status: Home / Self Care | Payer: Medicare Other | Attending: Cardiology | Admitting: Cardiology

## 2016-09-23 DIAGNOSIS — I4719 Other supraventricular tachycardia: Secondary | ICD-10-CM

## 2016-09-23 DIAGNOSIS — Z9841 Cataract extraction status, right eye: Secondary | ICD-10-CM

## 2016-09-23 DIAGNOSIS — I471 Supraventricular tachycardia: Secondary | ICD-10-CM

## 2016-09-23 DIAGNOSIS — Z955 Presence of coronary angioplasty implant and graft: Secondary | ICD-10-CM

## 2016-09-23 DIAGNOSIS — I2510 Atherosclerotic heart disease of native coronary artery without angina pectoris: Secondary | ICD-10-CM | POA: Diagnosis present

## 2016-09-23 DIAGNOSIS — E7800 Pure hypercholesterolemia, unspecified: Secondary | ICD-10-CM | POA: Diagnosis present

## 2016-09-23 DIAGNOSIS — E785 Hyperlipidemia, unspecified: Secondary | ICD-10-CM | POA: Diagnosis present

## 2016-09-23 DIAGNOSIS — Z87891 Personal history of nicotine dependence: Secondary | ICD-10-CM | POA: Diagnosis not present

## 2016-09-23 DIAGNOSIS — Z7902 Long term (current) use of antithrombotics/antiplatelets: Secondary | ICD-10-CM

## 2016-09-23 DIAGNOSIS — Z91048 Other nonmedicinal substance allergy status: Secondary | ICD-10-CM

## 2016-09-23 DIAGNOSIS — Z66 Do not resuscitate: Secondary | ICD-10-CM | POA: Diagnosis present

## 2016-09-23 DIAGNOSIS — R7989 Other specified abnormal findings of blood chemistry: Secondary | ICD-10-CM

## 2016-09-23 DIAGNOSIS — Z833 Family history of diabetes mellitus: Secondary | ICD-10-CM | POA: Diagnosis not present

## 2016-09-23 DIAGNOSIS — R0789 Other chest pain: Secondary | ICD-10-CM | POA: Diagnosis not present

## 2016-09-23 DIAGNOSIS — Z79899 Other long term (current) drug therapy: Secondary | ICD-10-CM

## 2016-09-23 DIAGNOSIS — I10 Essential (primary) hypertension: Secondary | ICD-10-CM | POA: Diagnosis not present

## 2016-09-23 DIAGNOSIS — K219 Gastro-esophageal reflux disease without esophagitis: Secondary | ICD-10-CM | POA: Diagnosis present

## 2016-09-23 DIAGNOSIS — I214 Non-ST elevation (NSTEMI) myocardial infarction: Secondary | ICD-10-CM | POA: Diagnosis not present

## 2016-09-23 DIAGNOSIS — I249 Acute ischemic heart disease, unspecified: Secondary | ICD-10-CM | POA: Diagnosis not present

## 2016-09-23 DIAGNOSIS — N179 Acute kidney failure, unspecified: Secondary | ICD-10-CM | POA: Diagnosis not present

## 2016-09-23 DIAGNOSIS — Z8249 Family history of ischemic heart disease and other diseases of the circulatory system: Secondary | ICD-10-CM | POA: Diagnosis not present

## 2016-09-23 DIAGNOSIS — R42 Dizziness and giddiness: Secondary | ICD-10-CM | POA: Diagnosis not present

## 2016-09-23 DIAGNOSIS — E876 Hypokalemia: Secondary | ICD-10-CM | POA: Diagnosis not present

## 2016-09-23 DIAGNOSIS — R079 Chest pain, unspecified: Secondary | ICD-10-CM

## 2016-09-23 DIAGNOSIS — Z8673 Personal history of transient ischemic attack (TIA), and cerebral infarction without residual deficits: Secondary | ICD-10-CM

## 2016-09-23 DIAGNOSIS — R778 Other specified abnormalities of plasma proteins: Secondary | ICD-10-CM

## 2016-09-23 DIAGNOSIS — E119 Type 2 diabetes mellitus without complications: Secondary | ICD-10-CM | POA: Diagnosis present

## 2016-09-23 DIAGNOSIS — I352 Nonrheumatic aortic (valve) stenosis with insufficiency: Secondary | ICD-10-CM | POA: Diagnosis present

## 2016-09-23 DIAGNOSIS — R748 Abnormal levels of other serum enzymes: Secondary | ICD-10-CM | POA: Diagnosis not present

## 2016-09-23 DIAGNOSIS — I252 Old myocardial infarction: Secondary | ICD-10-CM

## 2016-09-23 DIAGNOSIS — R112 Nausea with vomiting, unspecified: Secondary | ICD-10-CM | POA: Diagnosis present

## 2016-09-23 DIAGNOSIS — G4733 Obstructive sleep apnea (adult) (pediatric): Secondary | ICD-10-CM | POA: Diagnosis not present

## 2016-09-23 DIAGNOSIS — Z961 Presence of intraocular lens: Secondary | ICD-10-CM | POA: Diagnosis not present

## 2016-09-23 DIAGNOSIS — K580 Irritable bowel syndrome with diarrhea: Secondary | ICD-10-CM | POA: Diagnosis present

## 2016-09-23 LAB — CBC
HCT: 38.2 % (ref 36.0–46.0)
HEMOGLOBIN: 12.5 g/dL (ref 12.0–15.0)
MCH: 27.4 pg (ref 26.0–34.0)
MCHC: 32.7 g/dL (ref 30.0–36.0)
MCV: 83.8 fL (ref 78.0–100.0)
Platelets: 184 10*3/uL (ref 150–400)
RBC: 4.56 MIL/uL (ref 3.87–5.11)
RDW: 14.6 % (ref 11.5–15.5)
WBC: 7.3 10*3/uL (ref 4.0–10.5)

## 2016-09-23 LAB — BASIC METABOLIC PANEL
ANION GAP: 9 (ref 5–15)
BUN: 16 mg/dL (ref 6–20)
CALCIUM: 9.4 mg/dL (ref 8.9–10.3)
CHLORIDE: 106 mmol/L (ref 101–111)
CO2: 23 mmol/L (ref 22–32)
Creatinine, Ser: 0.98 mg/dL (ref 0.44–1.00)
GFR calc non Af Amer: 55 mL/min — ABNORMAL LOW (ref >60)
Glucose, Bld: 152 mg/dL — ABNORMAL HIGH (ref 65–99)
Potassium: 2.9 mmol/L — ABNORMAL LOW (ref 3.5–5.1)
Sodium: 138 mmol/L (ref 135–145)

## 2016-09-23 LAB — TSH: TSH: 0.503 u[IU]/mL (ref 0.350–4.500)

## 2016-09-23 LAB — GLUCOSE, CAPILLARY: Glucose-Capillary: 133 mg/dL — ABNORMAL HIGH (ref 65–99)

## 2016-09-23 LAB — TROPONIN I
Troponin I: 0.05 ng/mL (ref <0.03)
Troponin I: 0.05 ng/mL (ref <0.03)
Troponin I: 0.05 ng/mL (ref <0.03)
Troponin I: 0.05 ng/mL (ref <0.03)

## 2016-09-23 MED ORDER — INSULIN ASPART 100 UNIT/ML ~~LOC~~ SOLN
0.0000 [IU] | Freq: Three times a day (TID) | SUBCUTANEOUS | Status: DC
Start: 2016-09-24 — End: 2016-09-27
  Administered 2016-09-26 – 2016-09-27 (×3): 1 [IU] via SUBCUTANEOUS

## 2016-09-23 MED ORDER — GI COCKTAIL ~~LOC~~
30.0000 mL | Freq: Four times a day (QID) | ORAL | Status: DC | PRN
  Filled 2016-09-23: qty 30

## 2016-09-23 MED ORDER — ASPIRIN 81 MG PO CHEW
324.0000 mg | CHEWABLE_TABLET | Freq: Once | ORAL | Status: AC
  Administered 2016-09-23: 324 mg via ORAL
  Filled 2016-09-23: qty 4

## 2016-09-23 MED ORDER — ENOXAPARIN SODIUM 40 MG/0.4ML ~~LOC~~ SOLN
40.0000 mg | SUBCUTANEOUS | Status: DC
  Administered 2016-09-23: 40 mg via SUBCUTANEOUS
  Filled 2016-09-23: qty 0.4

## 2016-09-23 MED ORDER — HYDRALAZINE HCL 20 MG/ML IJ SOLN: 5.0000 mg | Freq: Four times a day (QID) | INTRAMUSCULAR | Status: DC | PRN

## 2016-09-23 MED ORDER — CLOPIDOGREL BISULFATE 75 MG PO TABS
75.0000 mg | ORAL_TABLET | Freq: Every day | ORAL | Status: DC
  Administered 2016-09-23 – 2016-09-27 (×5): 75 mg via ORAL
  Filled 2016-09-23 (×5): qty 1

## 2016-09-23 MED ORDER — ZOLPIDEM TARTRATE 5 MG PO TABS
5.0000 mg | ORAL_TABLET | Freq: Every day | ORAL | Status: DC
Start: 2016-09-23 — End: 2016-09-27
  Administered 2016-09-23 – 2016-09-26 (×4): 5 mg via ORAL
  Filled 2016-09-23 (×4): qty 1

## 2016-09-23 MED ORDER — POTASSIUM CHLORIDE CRYS ER 20 MEQ PO TBCR
40.0000 meq | EXTENDED_RELEASE_TABLET | Freq: Once | ORAL | Status: AC
  Administered 2016-09-23: 40 meq via ORAL
  Filled 2016-09-23: qty 2

## 2016-09-23 MED ORDER — ONDANSETRON HCL 4 MG/2ML IJ SOLN
4.0000 mg | Freq: Once | INTRAMUSCULAR | Status: AC | PRN
  Administered 2016-09-23: 4 mg via INTRAVENOUS
  Filled 2016-09-23: qty 2

## 2016-09-23 MED ORDER — PANTOPRAZOLE SODIUM 40 MG PO TBEC
40.0000 mg | DELAYED_RELEASE_TABLET | Freq: Every day | ORAL | Status: DC
  Administered 2016-09-23 – 2016-09-27 (×5): 40 mg via ORAL
  Filled 2016-09-23 (×5): qty 1

## 2016-09-23 MED ORDER — ACETAMINOPHEN 325 MG PO TABS
650.0000 mg | ORAL_TABLET | ORAL | Status: DC | PRN
  Administered 2016-09-23 – 2016-09-26 (×6): 650 mg via ORAL
  Filled 2016-09-23 (×9): qty 2

## 2016-09-23 MED ORDER — ONDANSETRON 4 MG PO TBDP
4.0000 mg | ORAL_TABLET | Freq: Once | ORAL | Status: AC
  Administered 2016-09-23: 4 mg via ORAL
  Filled 2016-09-23: qty 1

## 2016-09-23 MED ORDER — ISOSORBIDE MONONITRATE ER 30 MG PO TB24
30.0000 mg | ORAL_TABLET | Freq: Every day | ORAL | Status: DC
  Administered 2016-09-23 – 2016-09-27 (×5): 30 mg via ORAL
  Filled 2016-09-23 (×5): qty 1

## 2016-09-23 MED ORDER — ONDANSETRON HCL 4 MG/2ML IJ SOLN
4.0000 mg | Freq: Four times a day (QID) | INTRAMUSCULAR | Status: DC | PRN
  Administered 2016-09-24 – 2016-09-26 (×4): 4 mg via INTRAVENOUS
  Filled 2016-09-23 (×4): qty 2

## 2016-09-23 MED ORDER — SALINE SPRAY 0.65 % NA SOLN
1.0000 | NASAL | Status: DC | PRN
  Administered 2016-09-23: 1 via NASAL
  Filled 2016-09-23: qty 44

## 2016-09-23 MED ORDER — ATORVASTATIN CALCIUM 40 MG PO TABS
40.0000 mg | ORAL_TABLET | Freq: Every day | ORAL | Status: DC
  Administered 2016-09-23 – 2016-09-27 (×5): 40 mg via ORAL
  Filled 2016-09-23 (×5): qty 1

## 2016-09-23 MED ORDER — CARVEDILOL 3.125 MG PO TABS
6.2500 mg | ORAL_TABLET | Freq: Two times a day (BID) | ORAL | Status: DC
  Administered 2016-09-23 – 2016-09-24 (×2): 6.25 mg via ORAL
  Filled 2016-09-23 (×2): qty 2

## 2016-09-23 MED ORDER — HYDRALAZINE HCL 20 MG/ML IJ SOLN
5.0000 mg | Freq: Once | INTRAMUSCULAR | Status: AC
  Administered 2016-09-23: 5 mg via INTRAVENOUS
  Filled 2016-09-23: qty 1

## 2016-09-23 MED ORDER — MORPHINE SULFATE (PF) 2 MG/ML IV SOLN
2.0000 mg | INTRAVENOUS | Status: DC | PRN
  Administered 2016-09-23 – 2016-09-24 (×4): 2 mg via INTRAVENOUS
  Filled 2016-09-23 (×4): qty 1

## 2016-09-23 MED ORDER — ASPIRIN EC 81 MG PO TBEC
81.0000 mg | DELAYED_RELEASE_TABLET | Freq: Every day | ORAL | Status: DC
  Administered 2016-09-23 – 2016-09-27 (×4): 81 mg via ORAL
  Filled 2016-09-23 (×5): qty 1

## 2016-09-23 NOTE — ED Notes (Signed)
CRITICAL VALUE ALERT  Critical value received: Troponin 0.05  Date of notification:  09/23/2016  Time of notification: 1142    Nurse who received alert:  Susa Day  MD notified (1st page):  Dr. Rogene Houston notified

## 2016-09-23 NOTE — ED Triage Notes (Signed)
Pt reports cp in center of chest without radiation since last night.  Pt had nausea this morning.

## 2016-09-23 NOTE — ED Notes (Signed)
CRITICAL VALUE ALERT  Critical value received:  Troponin 0.05  Date of notification:  09/23/16  Time of notification:  0350  Critical value read back:Yes.    Nurse who received alert:  Parthenia Ames  MD notified (1st page):  Dr. Wallie Char  Time of first page:  1503  MD notified (2nd page):  Time of second page:  Responding MD:  Dr. Wallie Char  Time MD responded:  [phone]

## 2016-09-23 NOTE — H&P (Signed)
History and Physical    [name] SJG:[id number] DOB: 1941/05/07 DOA: 09/23/2016  PCP: Glo Herring., MD Consultants:  Harl Bowie - cardiology Patient coming from: home - lives alone; Stebbins, son, [phone]  Chief Complaint: chest pain  HPI: [name] is a 76 y.o. female with medical history significant of MI requiring 2 stents in 1995; diet-controlled DM; HTN; HLD presenting with a brief episode of chest pain last night.  "It wasn't even enough to worry about, I didn't think.  So I didn't."  But awoke this AM with n/v and so decided to come in.  Non-exertional, was watching TV.  Substernal, felt like pressure, 2-3/10.  Lasted only seconds.  Resolved and did not recur.  No associated symptoms at the time.  Slept well.  Awoke somewhat nauseated.  Vomited once about 30 minutes after getting up.  Has felt fine since then.  Last stress test was an MPS in 4/15.  Last cath was around the time of her MI in 1995, maybe one about 5 years later.  Last Echo was in 6/17 and showed preserved EF with elevated ventricular end-diastolic filling pressure.  ED Course: Per Dr. Rogene Houston:  Discussed with Dr. branch cardiology recommends admitting the patient for formal rule out despite both troponins being the same. Discussed with Dr. Lorin Mercy will admit to the telemetry unit. Temporary middle orders completed. Cardiology is a patient was safe to stay here.   Review of Systems: As per HPI; otherwise 10 point review of systems reviewed and negative.   Ambulatory Status:  Ambulates without difficulty  Past Medical History:  Diagnosis Date  . Anemia   . Aortic insufficiency 07/04/2011   most recent 2D Echo 02/24/2012  EF greater than 55%  . Aortic sclerosis 07/04/2011  . Back pain   . Carotid stenosis, bilateral 07/04/2011   most recent 05/2010 carotid doppler  . Cirrhosis ([place])   . Coronary artery disease   . CVA (cerebral infarction)    03/07/2014  . DDD (degenerative disc disease), lumbar   .  Diabetes mellitus 2005  . GERD (gastroesophageal reflux disease)   . Hiatal hernia   . HTN (hypertension) 07/04/2011  . Hypercholesteremia   . IBS (irritable bowel syndrome)   . Myocardial infarction 1995   required 2 stents  . Non-alcoholic fatty liver disease   . OSA (obstructive sleep apnea) 07/04/2011   sleep study 2009  AHI 9.91/hr and during REM sleep 35.58/hr  . Presence of stent in right coronary artery 07/04/2011  . Stroke ([place]) 03/07/2014   no deficits from stroke  . UTI (lower urinary tract infection)    chronic/recurring/on bactrim  . Varices, esophageal ([place])     Past Surgical History:  Procedure Laterality Date  . [place]  . APPENDECTOMY    . BACK SURGERY  1985  . CARDIAC CATHETERIZATION  1995.,2006,1997   stent to the proximal RCA after MI   . CATARACT EXTRACTION W/PHACO Right 07/04/2014   Procedure: CATARACT EXTRACTION PHACO AND INTRAOCULAR LENS PLACEMENT (IOC);  Surgeon: Elta Guadeloupe T. Gershon Crane, MD;  Location: [facility];  Service: Ophthalmology;  Laterality: Right;  CDE:13.13  . COLONOSCOPY    . DILATION AND CURETTAGE OF UTERUS     x2  . Epi Retinal Membrane Peel Left   . ERCP    . ESOPHAGEAL BANDING N/A 04/01/2013   Procedure: ESOPHAGEAL BANDING;  Surgeon: Rogene Houston, MD;  [facility];  Service: Endoscopy;  Laterality: N/A;  . ESOPHAGEAL BANDING  N/A 05/24/2013   Procedure: ESOPHAGEAL BANDING;  Surgeon: Rogene Houston, MD;  [facility];  Service: Endoscopy;  Laterality: N/A;  . ESOPHAGEAL BANDING N/A 06/21/2014   Procedure: ESOPHAGEAL BANDING;  Surgeon: Rogene Houston, MD;  [facility];  Service: Endoscopy;  Laterality: N/A;  . ESOPHAGOGASTRODUODENOSCOPY N/A 04/01/2013   Procedure: ESOPHAGOGASTRODUODENOSCOPY (EGD);  Surgeon: Rogene Houston, MD;  [facility];  Service: Endoscopy;  Laterality: N/A;  230-rescheduled to 8:30am Ann notified pt  . ESOPHAGOGASTRODUODENOSCOPY N/A 05/24/2013   Procedure:  ESOPHAGOGASTRODUODENOSCOPY (EGD);  Surgeon: Rogene Houston, MD;  [facility];  Service: Endoscopy;  Laterality: N/A;  730  . ESOPHAGOGASTRODUODENOSCOPY N/A 06/21/2014   Procedure: ESOPHAGOGASTRODUODENOSCOPY (EGD);  Surgeon: Rogene Houston, MD;  [facility];  Service: Endoscopy;  Laterality: N/A;  930-rescheduled 10/14 @ 1200 Ann to notify pt  . EYE SURGERY  08   cataract surgery of the left eye  . HARDWARE REMOVAL Right 01/17/2013   Procedure: REMOVAL OF HARDWARE AND EXCISION ULNAR STYLOID RIGHT WRIST;  Surgeon: Tennis Must, MD;  Location: [place];  Service: Orthopedics;  Laterality: Right;  . MYRINGOTOMY  2012   both ears  . TONSILLECTOMY    . VAGINAL HYSTERECTOMY  1972  . WRIST SURGERY     rt wrist hardwear removal    Social History   Social History  . Marital status: Widowed    Spouse name: N/A  . Number of children: 2  . Years of education: N/A   Occupational History  . retired Retired   Social History Main Topics  . Smoking status: Former Smoker    Packs/day: 0.25    Years: 50.00    Types: Cigarettes    Quit date: 01/13/2012  . Smokeless tobacco: Never Used  . Alcohol use No  . Drug use: No  . Sexual activity: Yes    Birth control/ protection: Surgical   Other Topics Concern  . Not on file   Social History Narrative  . No narrative on file    Allergies  Allergen Reactions  . Tape Rash    ADHESIVE TAPE    Family History  Problem Relation Age of Onset  . Diabetes Mother   . Heart failure Father   . Heart failure Maternal Aunt     Prior to Admission medications   Medication Sig Start Date End Date Taking? Authorizing Provider  acetaminophen (TYLENOL) 500 MG tablet Take 1,000-1,500 mg by mouth every 6 (six) hours as needed for moderate pain.   Yes Historical Provider, MD  aspirin EC 81 MG tablet Take 81 mg by mouth daily.   Yes Historical Provider, MD  atorvastatin (LIPITOR) 40 MG tablet Take 40 mg by mouth  daily. Reported on 12/17/2015   Yes Historical Provider, MD  carvedilol (COREG) 6.25 MG tablet Take 6.25 mg by mouth 2 (two) times daily.   Yes Historical Provider, MD  clopidogrel (PLAVIX) 75 MG tablet Take 1 tablet (75 mg total) by mouth daily. 05/06/16  Yes Erline Hau, MD  isosorbide mononitrate (IMDUR) 30 MG 24 hr tablet Take 1 tablet by mouth daily. 04/20/16  Yes Historical Provider, MD  omeprazole (PRILOSEC) 20 MG capsule Take 1 capsule (20 mg total) by mouth 2 (two) times daily before a meal. 03/25/16  Yes Terri L Setzer, NP  ondansetron (ZOFRAN) 8 MG tablet Take 1 tablet (8 mg total) by mouth every 8 (eight) hours as needed for nausea  or vomiting. 09/11/16  Yes Butch Penny, NP  zolpidem (AMBIEN) 10 MG tablet Take 10 mg by mouth at bedtime.   Yes Historical Provider, MD    Physical Exam: Vitals:   09/23/16 1415 09/23/16 1443 09/23/16 1518 09/23/16 1530  BP:  186/63 193/69   Pulse: 98  80 97  Resp: 18  17 21   Temp:      TempSrc:      SpO2: 97%  (!) 87% 98%  Weight:      Height:         General:  Appears calm and comfortable and is NAD Eyes:  PERRL, EOMI, normal lids, iris ENT:  grossly normal hearing, lips & tongue, mmm Neck:  no LAD, masses or thyromegaly Cardiovascular:  RRR, 3/6 systolic murmur that radiates into L>R carotid.  no r/g. No LE edema.  Respiratory:  CTA bilaterally, no w/r/r. Normal respiratory effort. Abdomen:  soft, ntnd, NABS Skin:  no rash or induration seen on limited exam Musculoskeletal:  grossly normal tone BUE/BLE, good ROM, no bony abnormality Psychiatric:  grossly normal mood and affect, speech fluent and appropriate, AOx3 Neurologic:  CN 2-12 grossly intact, moves all extremities in coordinated fashion, sensation intact  Labs on Admission: I have personally reviewed following labs and imaging studies  CBC:  Recent Labs Lab 09/23/16 1106  WBC 7.3  HGB 12.5  HCT 38.2  MCV 83.8  PLT 048   Basic Metabolic Panel:  Recent  Labs Lab 09/23/16 1106  NA 138  K 2.9*  CL 106  CO2 23  GLUCOSE 152*  BUN 16  CREATININE 0.98  CALCIUM 9.4   GFR: Estimated Creatinine Clearance: 46.6 mL/min (by C-G formula based on SCr of 0.98 mg/dL). Liver Function Tests: No results for input(s): AST, ALT, ALKPHOS, BILITOT, PROT, ALBUMIN in the last 168 hours. No results for input(s): LIPASE, AMYLASE in the last 168 hours. No results for input(s): AMMONIA in the last 168 hours. Coagulation Profile: No results for input(s): INR, PROTIME in the last 168 hours. Cardiac Enzymes:  Recent Labs Lab 09/23/16 1106 09/23/16 1412  TROPONINI 0.05* 0.05*   BNP (last 3 results) No results for input(s): PROBNP in the last 8760 hours. HbA1C: No results for input(s): HGBA1C in the last 72 hours. CBG: No results for input(s): GLUCAP in the last 168 hours. Lipid Profile: No results for input(s): CHOL, HDL, LDLCALC, TRIG, CHOLHDL, LDLDIRECT in the last 72 hours. Thyroid Function Tests: No results for input(s): TSH, T4TOTAL, FREET4, T3FREE, THYROIDAB in the last 72 hours. Anemia Panel: No results for input(s): VITAMINB12, FOLATE, FERRITIN, TIBC, IRON, RETICCTPCT in the last 72 hours. Urine analysis:    Component Value Date/Time   COLORURINE YELLOW 02/08/2016 0205   APPEARANCEUR HAZY (A) 02/08/2016 0205   LABSPEC 1.025 02/08/2016 0205   PHURINE 5.5 02/08/2016 0205   GLUCOSEU NEGATIVE 02/08/2016 0205   HGBUR NEGATIVE 02/08/2016 0205   BILIRUBINUR NEGATIVE 02/08/2016 0205   KETONESUR NEGATIVE 02/08/2016 0205   PROTEINUR TRACE (A) 02/08/2016 0205   UROBILINOGEN 0.2 07/09/2015 0930   NITRITE NEGATIVE 02/08/2016 0205   LEUKOCYTESUR NEGATIVE 02/08/2016 0205    Creatinine Clearance: Estimated Creatinine Clearance: 46.6 mL/min (by C-G formula based on SCr of 0.98 mg/dL).  Sepsis Labs: @LABRCNTIP (procalcitonin:4,lacticidven:4) )No results found for this or any previous visit (from the past 240 hour(s)).   Radiological Exams on  Admission: Dg Chest 2 View  Result Date: 09/23/2016 CLINICAL DATA:  Central chest pain since last night. Nausea this morning. EXAM: CHEST  2 VIEW COMPARISON:  03/27/2016 FINDINGS: The cardiomediastinal silhouette is within normal limits. The lungs are a mildly hyperinflated and clear. No pleural effusion or pneumothorax is identified. Thoracic spondylosis is noted. IMPRESSION: No active cardiopulmonary disease. Electronically Signed   By: Logan Bores M.D.   On: 09/23/2016 11:31    EKG: Independently reviewed.  Sinus tachycardia with rate 101;  no evidence of acute ischemia  Assessment/Plan Principal Problem:   Chest pain Active Problems:   DM (diabetes mellitus) (HCC)   Hypercholesteremia   HTN (hypertension)   Hypokalemia   Chest pain -Patient with substernal chest pain that came on at rest, was fleeting, and resolved spontaneously. -1/3 typical symptoms suggestive of noncardiac chest pain.  -CXR unremarkable.   -Initial cardiac troponin minimally positive at 0.05 and repeat is the same.   -EKG not indicative of acute ischemia.   -TIMI risk score is 4; which predicts a 14 days risk of death, recurrent MI, or urgent revascularization of 19.9%.  -Will plan to place in observation status on telemetry to rule out ACS by overnight observation.  -cycle troponin q6h x 3 and repeat EKG in AM -Continue ASA 81 mg and Plavix daily -morphine given -statin given -Continue Imdur -Risk factor stratification with HgbA1c; will also check TSH -Cardiology consultation by Dr. Harl Bowie - NPO for possible stress test if things change overnight -Will plan to start Heparin drip if her enzymes are positive and/or chest pain recurs  HTN -Takes low-dose Coreg monotherapy at home -Patient with suboptimal control while in the ER -Will add Lisinopril 10 mg PO daily as per recommendation of Dr. Harl Bowie. -Will also add prn hydralazine  HLD -Continue Lipitor -Lipids were checked in 8/17 (TC 127, HDL 40, LDL  64, TG 117) so will not repeat at this time  DM -Glucose 152 -Diet controlled -Last A1c was 5.2 -There is no indication to start medication at this time, but will cover with SSI for now  Hypokalemia -K 2.9 -Was given 40 mEq KCl PO x 1 in the ER -Will repeat 40 mEq KCl PO x 1 at 6pm -Recheck K+ in AM  DVT prophylaxis:  Lovenox  Code Status: DNR- confirmed with patient Family Communication: None present Disposition Plan:  Home once clinically improved Consults called: Cardiology  Admission status: It is my clinical opinion that referral for OBSERVATION is reasonable and necessary in this patient based on the above information provided. The aforementioned taken together are felt to place the patient at high risk for further clinical deterioration. However it is anticipated that the patient may be medically stable for discharge from the hospital within 24 to 48 hours.     Karmen Bongo MD [facility]  If 7PM-7AM, please contact night-coverage [email or web address] Password [email or web address]  09/23/2016, 4:31 PM

## 2016-09-23 NOTE — Consult Note (Signed)
Primary cardiologist: Dr Carlyle Dolly Consulting cardiologist: Dr Carlyle Dolly Requesting: Dr Rogene Houston Indication: chest pain  Clinical Summary [name] is a 76 y.o.female history of CVA, CAD with MI in 1995 where she received 2 stents to her RCA with last cath 2007 with patent coronaries, long history of palpitations with fairly benign cardiac monitors, carotid stenosis, cryptogenic cirrhosis, HL admitted with chest pain  She reports episode around 11pm last night while at rest. 3-4/`0 pressure in midchest. No other associated symptoms. Lasted just a few seconds, self resolved. Was able to go to sleep without troubles. This morning woke up with nausea and decided to come to ER. Denies any recent DOE or SOB.    ER vitals: bp 188/72 p 100 97% RA K 2.9, Cr 0.98, Hgb 11.6, Plt 184,  Trop 0.05-->0.05 CXR no acute process EKG SR no acute ischemic changes 04/2016 echo LVEF 55-60%, abnormal diastolic function, mild AS, moderate AI, mild MR    Allergies  Allergen Reactions  . Tape Rash    ADHESIVE TAPE    Medications Scheduled Medications:    Infusions:    PRN Medications:     Past Medical History:  Diagnosis Date  . Anemia   . Aortic insufficiency 07/04/2011   most recent 2D Echo 02/24/2012  EF greater than 55%  . Aortic sclerosis 07/04/2011  . Back pain   . Carotid stenosis, bilateral 07/04/2011   most recent 05/2010 carotid doppler  . Cirrhosis ([place])   . Coronary artery disease   . CVA (cerebral infarction)    03/07/2014  . DDD (degenerative disc disease), lumbar   . Diabetes mellitus 2005  . GERD (gastroesophageal reflux disease)   . GERD (gastroesophageal reflux disease)   . Hiatal hernia   . HTN (hypertension) 07/04/2011  . Hypercholesteremia   . IBS (irritable bowel syndrome)   . MI (myocardial infarction)   . Myocardial infarction 1995  . Non-alcoholic fatty liver disease   . OSA (obstructive sleep apnea) 07/04/2011   sleep study 2009  AHI  9.91/hr and during REM sleep 35.58/hr  . Presence of stent in right coronary artery 07/04/2011  . Stroke ([place]) 03/07/2014   no deficits from stroke  . UTI (lower urinary tract infection)    chronic/recurring/on bactrim  . Varices, esophageal ([place])     Past Surgical History:  Procedure Laterality Date  . [place]  . APPENDECTOMY    . BACK SURGERY  1985  . CARDIAC CATHETERIZATION  1995.,2006,1997   stent to the proximal RCA after MI   . CATARACT EXTRACTION W/PHACO Right 07/04/2014   Procedure: CATARACT EXTRACTION PHACO AND INTRAOCULAR LENS PLACEMENT (IOC);  Surgeon: Elta Guadeloupe T. Gershon Crane, MD;  Location: [facility];  Service: Ophthalmology;  Laterality: Right;  CDE:13.13  . COLONOSCOPY    . DILATION AND CURETTAGE OF UTERUS     x2  . Epi Retinal Membrane Peel Left   . ERCP    . ESOPHAGEAL BANDING N/A 04/01/2013   Procedure: ESOPHAGEAL BANDING;  Surgeon: Rogene Houston, MD;  [facility];  Service: Endoscopy;  Laterality: N/A;  . ESOPHAGEAL BANDING N/A 05/24/2013   Procedure: ESOPHAGEAL BANDING;  Surgeon: Rogene Houston, MD;  [facility];  Service: Endoscopy;  Laterality: N/A;  . ESOPHAGEAL BANDING N/A 06/21/2014   Procedure: ESOPHAGEAL BANDING;  Surgeon: Rogene Houston, MD;  [facility];  Service: Endoscopy;  Laterality: N/A;  . ESOPHAGOGASTRODUODENOSCOPY N/A 04/01/2013   Procedure: ESOPHAGOGASTRODUODENOSCOPY (EGD);  Surgeon: Rogene Houston, MD;  [facility];  Service: Endoscopy;  Laterality: N/A;  230-rescheduled to 8:30am Ann notified pt  . ESOPHAGOGASTRODUODENOSCOPY N/A 05/24/2013   Procedure: ESOPHAGOGASTRODUODENOSCOPY (EGD);  Surgeon: Rogene Houston, MD;  [facility];  Service: Endoscopy;  Laterality: N/A;  730  . ESOPHAGOGASTRODUODENOSCOPY N/A 06/21/2014   Procedure: ESOPHAGOGASTRODUODENOSCOPY (EGD);  Surgeon: Rogene Houston, MD;  [facility];  Service: Endoscopy;  Laterality: N/A;  930-rescheduled  10/14 @ 1200 Ann to notify pt  . EYE SURGERY  08   cataract surgery of the left eye  . HARDWARE REMOVAL Right 01/17/2013   Procedure: REMOVAL OF HARDWARE AND EXCISION ULNAR STYLOID RIGHT WRIST;  Surgeon: Tennis Must, MD;  Location: [place];  Service: Orthopedics;  Laterality: Right;  . MYRINGOTOMY  2012   both ears  . TONSILLECTOMY    . VAGINAL HYSTERECTOMY  1972  . WRIST SURGERY     rt wrist hardwear removal    Family History  Problem Relation Age of Onset  . Diabetes Mother   . Heart failure Father   . Heart failure Maternal Aunt     Social History [name] reports that she quit smoking about 4 years ago. Her smoking use included Cigarettes. She has a 12.50 pack-year smoking history. She has never used smokeless tobacco. [name] reports that she does not drink alcohol.  Review of Systems CONSTITUTIONAL: No weight loss, fever, chills, weakness or fatigue.  HEENT: Eyes: No visual loss, blurred vision, double vision or yellow sclerae. No hearing loss, sneezing, congestion, runny nose or sore throat.  SKIN: No rash or itching.  CARDIOVASCULAR: per HPI  RESPIRATORY: No shortness of breath, cough or sputum.  GASTROINTESTINAL: +nauea GENITOURINARY: no polyuria, no dysuria NEUROLOGICAL: No headache, dizziness, syncope, paralysis, ataxia, numbness or tingling in the extremities. No change in bowel or bladder control.  MUSCULOSKELETAL: No muscle, back pain, joint pain or stiffness.  HEMATOLOGIC: No anemia, bleeding or bruising.  LYMPHATICS: No enlarged nodes. No history of splenectomy.  PSYCHIATRIC: No history of depression or anxiety.      Physical Examination Blood pressure 186/63, pulse 98, temperature 97.7 F (36.5 C), temperature source Oral, resp. rate 18, height 5' 3"  (1.6 m), weight 150 lb (68 kg), SpO2 97 %. No intake or output data in the 24 hours ending 09/23/16 1555  Gen: NAD  HEENT: sclera clear, throat clear  Cardiovascular: RRR, 2/6  systolic murmur at apex, no jvd  Respiratory: CTAB  GI: abdomen soft, NT, nD  MSK: no LE edema  Neuro: no focal deficits  Psych: appropriate affect   Lab Results  Basic Metabolic Panel:  Recent Labs Lab 09/23/16 1106  NA 138  K 2.9*  CL 106  CO2 23  GLUCOSE 152*  BUN 16  CREATININE 0.98  CALCIUM 9.4    Liver Function Tests: No results for input(s): AST, ALT, ALKPHOS, BILITOT, PROT, ALBUMIN in the last 168 hours.  CBC:  Recent Labs Lab 09/23/16 1106  WBC 7.3  HGB 12.5  HCT 38.2  MCV 83.8  PLT 184    Cardiac Enzymes:  Recent Labs Lab 09/23/16 1106 09/23/16 1412  TROPONINI 0.05* 0.05*    BNP: Invalid input(s): POCBNP   ECG   Imaging   Impression     Recommendations 1. Chest pain/CAD - isolated fairly mild short episode of chest pain. Nonspecific troponin of 0.05 that is flat. No specifici EKG changes supporting ischemia - perhaps mild troponin  elevation and symptoms due to very high BPs, SBPs in the 200s in the ER - would observe overnight with serial enzymes, repeat EKG in AM - make NPO at midnight in case ishcemic testing is indicated. Recent echo, would not repeat - if recurrence of pain or uptrending troponin would start heparin drip.    2. Hypokalemia - replacement per primary team  3. HTN - elevated bp's, she reports medication compliance - with her history of CAD would start lisionpril 62m daily. Will write for 519mof IV hydrlazine in the ER.     JoCarlyle DollyM.D.

## 2016-09-23 NOTE — ED Provider Notes (Addendum)
[facility] DEPT Provider Note   CSN: [id number] Arrival date & time: 09/23/16  1038   By signing my name below, I, Collene Leyden, attest that this documentation has been prepared under the direction and in the presence of Fredia Sorrow, MD. Electronically Signed: Collene Leyden, Scribe. 09/23/16. 11:57 AM.  History   Chief Complaint Chief Complaint  Patient presents with  . Chest Pain    HPI Comments: [name] is a 76 y.o. female with a hx of heart catheterization with 2 stent placements, who presents to the Emergency Department complaining of substernal chest pain that happened at 11:30 last night. Patient no longer has chest discomfort, went away at around 8:30. Patietn has associated nausea, vomiting (after chest pain was gone) and dizziness. She denies any shortness of breath or fever.    The history is provided by the patient. No language interpreter was used.  Chest Pain   This is a new problem. The current episode started 12 to 24 hours ago. The problem has been resolved. The pain is present in the substernal region. The pain does not radiate. Duration of episode(s) is 9 hours. Associated symptoms include nausea and vomiting. Pertinent negatives include no fever and no shortness of breath. She has tried nothing for the symptoms.  Her past medical history is significant for CAD and strokes.  Her family medical history is significant for diabetes.  Procedure history is positive for cardiac catheterization.    Past Medical History:  Diagnosis Date  . Anemia   . Aortic insufficiency 07/04/2011   most recent 2D Echo 02/24/2012  EF greater than 55%  . Aortic sclerosis 07/04/2011  . Back pain   . Carotid stenosis, bilateral 07/04/2011   most recent 05/2010 carotid doppler  . Cirrhosis ([place])   . Coronary artery disease   . CVA (cerebral infarction)    03/07/2014  . DDD (degenerative disc disease), lumbar   . Diabetes mellitus 2005  . GERD (gastroesophageal reflux  disease)   . GERD (gastroesophageal reflux disease)   . Hiatal hernia   . HTN (hypertension) 07/04/2011  . Hypercholesteremia   . IBS (irritable bowel syndrome)   . MI (myocardial infarction)   . Myocardial infarction 1995  . Non-alcoholic fatty liver disease   . OSA (obstructive sleep apnea) 07/04/2011   sleep study 2009  AHI 9.91/hr and during REM sleep 35.58/hr  . Presence of stent in right coronary artery 07/04/2011  . Stroke ([place]) 03/07/2014   no deficits from stroke  . UTI (lower urinary tract infection)    chronic/recurring/on bactrim  . Varices, esophageal [facility])     Patient Active Problem List   Diagnosis Date Noted  . TIA (transient ischemic attack) 05/05/2016  . Intractable nausea and vomiting 10/15/2015  . Chest pain 06/01/2015  . Diarrhea 06/01/2015  . Bronchitis 06/01/2015  . Thrombocytopenia ([place]) 06/01/2015  . ARF (acute renal failure) ([place]) 03/20/2014  . CVA (cerebral vascular accident) ([place]) 03/06/2014  . Acute renal failure ([place]) 03/06/2014  . CVA (cerebral infarction) 03/06/2014  . UTI (lower urinary tract infection)   . PSVT (paroxysmal supraventricular tachycardia), recent episode this week 12/09/2013  . Esophageal varices ([place]) 03/29/2013  . Hematemesis 03/29/2013  . Hepatic cirrhosis ([place]) 03/29/2013  . Presence of stent in right coronary artery 07/04/2011  . OSA (obstructive sleep apnea) 07/04/2011  . Aortic sclerosis 07/04/2011  . Aortic insufficiency 07/04/2011  . HTN (hypertension) 07/04/2011  . Carotid stenosis, bilateral 07/04/2011  . Chest pain at rest 07/03/2011  .  CAD (coronary artery disease) 07/03/2011  . DM (diabetes mellitus) ([place]) 07/03/2011  . Hypercholesteremia 07/03/2011  . GERD (gastroesophageal reflux disease) 07/03/2011    Past Surgical History:  Procedure Laterality Date  . [place]  . APPENDECTOMY    . BACK SURGERY  1985  . CARDIAC CATHETERIZATION  1995.,2006,1997   stent to the proximal RCA after MI     . CATARACT EXTRACTION W/PHACO Right 07/04/2014   Procedure: CATARACT EXTRACTION PHACO AND INTRAOCULAR LENS PLACEMENT (IOC);  Surgeon: Elta Guadeloupe T. Gershon Crane, MD;  Location: [facility] ORS;  Service: Ophthalmology;  Laterality: Right;  CDE:13.13  . COLONOSCOPY    . DILATION AND CURETTAGE OF UTERUS     x2  . Epi Retinal Membrane Peel Left   . ERCP    . ESOPHAGEAL BANDING N/A 04/01/2013   Procedure: ESOPHAGEAL BANDING;  Surgeon: Rogene Houston, MD;  Location: [facility] ENDO SUITE;  Service: Endoscopy;  Laterality: N/A;  . ESOPHAGEAL BANDING N/A 05/24/2013   Procedure: ESOPHAGEAL BANDING;  Surgeon: Rogene Houston, MD;  Location: [facility];  Service: Endoscopy;  Laterality: N/A;  . ESOPHAGEAL BANDING N/A 06/21/2014   Procedure: ESOPHAGEAL BANDING;  Surgeon: Rogene Houston, MD;  Location: [facility];  Service: Endoscopy;  Laterality: N/A;  . ESOPHAGOGASTRODUODENOSCOPY N/A 04/01/2013   Procedure: ESOPHAGOGASTRODUODENOSCOPY (EGD);  Surgeon: Rogene Houston, MD;  Location: [facility];  Service: Endoscopy;  Laterality: N/A;  230-rescheduled to 8:30am Ann notified pt  . ESOPHAGOGASTRODUODENOSCOPY N/A 05/24/2013   Procedure: ESOPHAGOGASTRODUODENOSCOPY (EGD);  Surgeon: Rogene Houston, MD;  Location: [facility];  Service: Endoscopy;  Laterality: N/A;  730  . ESOPHAGOGASTRODUODENOSCOPY N/A 06/21/2014   Procedure: ESOPHAGOGASTRODUODENOSCOPY (EGD);  Surgeon: Rogene Houston, MD;  Location: [facility];  Service: Endoscopy;  Laterality: N/A;  930-rescheduled 10/14 @ 1200 Ann to notify pt  . EYE SURGERY  08   cataract surgery of the left eye  . HARDWARE REMOVAL Right 01/17/2013   Procedure: REMOVAL OF HARDWARE AND EXCISION ULNAR STYLOID RIGHT WRIST;  Surgeon: Tennis Must, MD;  Location: [facility];  Service: Orthopedics;  Laterality: Right;  . MYRINGOTOMY  2012   both ears  . TONSILLECTOMY    . VAGINAL HYSTERECTOMY  1972  . WRIST SURGERY     rt wrist hardwear removal    OB History    No data  available       Home Medications    Prior to Admission medications   Medication Sig Start Date End Date Taking? Authorizing Provider  acetaminophen (TYLENOL) 500 MG tablet Take 1,000-1,500 mg by mouth every 6 (six) hours as needed for moderate pain.   Yes Historical Provider, MD  aspirin EC 81 MG tablet Take 81 mg by mouth daily.   Yes Historical Provider, MD  atorvastatin (LIPITOR) 40 MG tablet Take 40 mg by mouth daily. Reported on 12/17/2015   Yes Historical Provider, MD  carvedilol (COREG) 6.25 MG tablet Take 6.25 mg by mouth 2 (two) times daily.   Yes Historical Provider, MD  clopidogrel (PLAVIX) 75 MG tablet Take 1 tablet (75 mg total) by mouth daily. 05/06/16  Yes Erline Hau, MD  isosorbide mononitrate (IMDUR) 30 MG 24 hr tablet Take 1 tablet by mouth daily. 04/20/16  Yes Historical Provider, MD  omeprazole (PRILOSEC) 20 MG capsule Take 1 capsule (20 mg total) by mouth 2 (two) times daily before a meal. 03/25/16  Yes Butch Penny, NP  ondansetron (ZOFRAN) 8  MG tablet Take 1 tablet (8 mg total) by mouth every 8 (eight) hours as needed for nausea or vomiting. 09/11/16  Yes Butch Penny, NP  zolpidem (AMBIEN) 10 MG tablet Take 10 mg by mouth at bedtime.   Yes Historical Provider, MD    Family History Family History  Problem Relation Age of Onset  . Diabetes Mother   . Heart failure Father   . Heart failure Maternal Aunt     Social History Social History  Substance Use Topics  . Smoking status: Former Smoker    Packs/day: 0.25    Years: 50.00    Types: Cigarettes    Quit date: 01/13/2012  . Smokeless tobacco: Never Used  . Alcohol use No     Allergies   Tape   Review of Systems Review of Systems  Constitutional: Negative for fever.  Respiratory: Negative for shortness of breath.   Cardiovascular: Positive for chest pain.  Gastrointestinal: Positive for nausea and vomiting.  Hematological: Bruises/bleeds easily.     Physical Exam Updated Vital  Signs BP 186/63 (BP Location: Left Arm)   Pulse 98   Temp 97.7 F (36.5 C) (Oral)   Resp 18   Ht 5' 3"  (1.6 m)   Wt 68 kg   SpO2 97%   BMI 26.57 kg/m   Physical Exam  Constitutional: She is oriented to person, place, and time. She appears well-developed.  HENT:  Head: Normocephalic and atraumatic.  Mouth/Throat: Oropharynx is clear and moist.  Eyes: Conjunctivae and EOM are normal. Pupils are equal, round, and reactive to light.  Neck: Normal range of motion. Neck supple.  Cardiovascular: Normal rate, regular rhythm and normal heart sounds.   No murmur heard. Pateint has a right artery stent.   Pulmonary/Chest: Effort normal and breath sounds normal. She has no wheezes.  O2 Sats 97% on room air.   Abdominal: Soft. Bowel sounds are normal. She exhibits no distension. There is no tenderness.  Neurological: She is alert and oriented to person, place, and time.  Skin: Skin is warm and dry.     ED Treatments / Results  DIAGNOSTIC STUDIES: Oxygen Saturation is 98% on RA, normal by my interpretation.    COORDINATION OF CARE: 11:57 AM Discussed treatment plan with pt at bedside and pt agreed to plan.  Labs (all labs ordered are listed, but only abnormal results are displayed) Labs Reviewed  BASIC METABOLIC PANEL - Abnormal; Notable for the following:       Result Value   Potassium 2.9 (*)    Glucose, Bld 152 (*)    GFR calc non Af Amer 55 (*)    All other components within normal limits  TROPONIN I - Abnormal; Notable for the following:    Troponin I 0.05 (*)    All other components within normal limits  TROPONIN I - Abnormal; Notable for the following:    Troponin I 0.05 (*)    All other components within normal limits  CBC    EKG  EKG Interpretation  Date/Time:  Tuesday September 23 2016 10:50:38 EST Ventricular Rate:  101 PR Interval:    QRS Duration: 97 QT Interval:  361 QTC Calculation: 468 R Axis:   -12 Text Interpretation:  Sinus tachycardia Anterior  infarct, old Interpretation limited secondary to artifact Confirmed by Riata Ikeda  MD, Alesandra Smart [phone]) on 09/23/2016 12:47:17 PM       Radiology Dg Chest 2 View  Result Date: 09/23/2016 CLINICAL DATA:  Central chest pain since last  night. Nausea this morning. EXAM: CHEST  2 VIEW COMPARISON:  03/27/2016 FINDINGS: The cardiomediastinal silhouette is within normal limits. The lungs are a mildly hyperinflated and clear. No pleural effusion or pneumothorax is identified. Thoracic spondylosis is noted. IMPRESSION: No active cardiopulmonary disease. Electronically Signed   By: Logan Bores M.D.   On: 09/23/2016 11:31    Procedures Procedures (including critical care time)  Medications Ordered in ED Medications  ondansetron (ZOFRAN) injection 4 mg (4 mg Intravenous Given 09/23/16 1131)  potassium chloride SA (K-DUR,KLOR-CON) CR tablet 40 mEq (40 mEq Oral Given 09/23/16 1307)  ondansetron (ZOFRAN-ODT) disintegrating tablet 4 mg (4 mg Oral Given 09/23/16 1306)  aspirin chewable tablet 324 mg (324 mg Oral Given 09/23/16 1306)     Initial Impression / Assessment and Plan / ED Course  I have reviewed the triage vital signs and the nursing notes.  Pertinent labs & imaging results that were available during my care of the patient were reviewed by me and considered in my medical decision making (see chart for details).  Clinical Course    Patient with a known history of coronary disease. Does have a right coronary artery stent. Patient with onset of chest pain at about 11:30 PM last night continued until 8 this morning then resolved. Following that patient developed some nausea and vomiting. Patient is not any chest pain here. No further nausea and vomiting. Patient stated the chest pain was different than her normal cardiac chest pain.  Patient's initial troponin was slightly elevated at 0.05. Repeat troponin 3 hours later still the same 0.05.  Patient's chest x-ray without acute findings patient's labs  otherwise without significant amounts of the potassium at 2.9. Patient given some oral potassium here.  With the troponin not changing patient may very well be a candidate to go home. But since we do have 2 that are elevated we'll discuss with hospitalist to determine whether at least of rule out overnight would be warranted to be on safe side.  Patient's initial EKG had some slight sinus tachycardia. Patient has no hypoxia and no shortness of breath. Not concerned about pulmonary embolus. Patient overall feels much better.   Final Clinical Impressions(s) / ED Diagnoses   Final diagnoses:  Chest pain, unspecified type  Hypokalemia    New Prescriptions New Prescriptions   No medications on file   I personally performed the services described in this documentation, which was scribed in my presence. The recorded information has been reviewed and is accurate.       Fredia Sorrow, MD 09/23/16 1517   Addendum: Discussed with Dr. branch cardiology recommends admitting the patient for formal rule out despite both troponins being the same. Discussed with Dr. Lorin Mercy will admit to the telemetry unit. Temporary middle orders completed. Cardiology is a patient was safe to stay here.   Fredia Sorrow, MD 09/23/16 1600

## 2016-09-24 ENCOUNTER — Observation Stay (HOSPITAL_BASED_OUTPATIENT_CLINIC_OR_DEPARTMENT_OTHER): Payer: Medicare Other

## 2016-09-24 DIAGNOSIS — I10 Essential (primary) hypertension: Secondary | ICD-10-CM | POA: Diagnosis not present

## 2016-09-24 DIAGNOSIS — R079 Chest pain, unspecified: Secondary | ICD-10-CM

## 2016-09-24 DIAGNOSIS — R778 Other specified abnormalities of plasma proteins: Secondary | ICD-10-CM

## 2016-09-24 DIAGNOSIS — R7989 Other specified abnormal findings of blood chemistry: Secondary | ICD-10-CM

## 2016-09-24 DIAGNOSIS — E119 Type 2 diabetes mellitus without complications: Secondary | ICD-10-CM

## 2016-09-24 LAB — GLUCOSE, CAPILLARY
GLUCOSE-CAPILLARY: 117 mg/dL — AB (ref 65–99)
Glucose-Capillary: 145 mg/dL — ABNORMAL HIGH (ref 65–99)
Glucose-Capillary: 132 mg/dL — ABNORMAL HIGH (ref 65–99)

## 2016-09-24 LAB — BASIC METABOLIC PANEL
ANION GAP: 7 (ref 5–15)
BUN: 12 mg/dL (ref 6–20)
CHLORIDE: 108 mmol/L (ref 101–111)
CO2: 24 mmol/L (ref 22–32)
CREATININE: 0.83 mg/dL (ref 0.44–1.00)
Calcium: 9.3 mg/dL (ref 8.9–10.3)
GFR calc non Af Amer: >60 mL/min (ref >60)
Glucose, Bld: 145 mg/dL — ABNORMAL HIGH (ref 65–99)
POTASSIUM: 3.9 mmol/L (ref 3.5–5.1)
SODIUM: 139 mmol/L (ref 135–145)

## 2016-09-24 LAB — ECHOCARDIOGRAM COMPLETE
AO mean calculated velocity dopler: 145 cm/s
AOPV: 0.66 m/s
AV VEL mean LVOT/AV: 0.68
AV pk vel: 246 cm/s
AVA: 1.98 cm2
AVAREAMEANV: 1.93 cm2
AVAREAMEANVIN: 1.11 cm2/m2
AVAREAVTI: 1.87 cm2
AVAREAVTIIND: 1.14 cm2/m2
AVG: 11 mmHg
AVLVOTPG: 10 mmHg
AVPG: 24 mmHg
Area-P 1/2: 3.44 cm2
CHL CUP AV PEAK INDEX: 1.07
CHL CUP AV VALUE AREA INDEX: 1.14
CHL CUP AV VEL: 1.98
CHL CUP LVOT MV VTI: 2.27
CHL CUP MV DEC (S): 208
CHL CUP STROKE VOLUME: 48 mL
EWDT: 208 ms
FS: 33 % (ref 28–44)
Height: 63 in
IV/PV OW: 0.93
LA diam end sys: 34 mm
LA diam index: 1.95 cm/m2
LA vol index: 51.2 mL/m2
LASIZE: 34 mm
LAVOL: 89.3 mL
LAVOLA4C: 73.5 mL
LV dias vol index: 41 mL/m2
LV dias vol: 72 mL (ref 46–106)
LV sys vol index: 14 mL/m2
LV sys vol: 25 mL (ref 14–42)
LVOT MV VTI INDEX: 1.3 cm2/m2
LVOT SV: 84 mL
LVOT VTI: 29.7 cm
LVOT area: 2.84 cm2
LVOT diameter: 19 mm
LVOT peak VTI: 0.7 cm
LVOTPV: 162 cm/s
Lateral S' vel: 21 cm/s
MV Annulus VTI: 37.1 cm
MV M vel: 71.8
MVPG: 14 mmHg
MVPKEVEL: 187 m/s
MVSPHT: 64 ms
Mean grad: 3 mmHg
P 1/2 time: 282 ms
PW: 12.3 mm — AB (ref 0.6–1.1)
Simpson's disk: 66
TAPSE: 23.8 mm
VTI: 42.7 cm
Weight: 2371.2 oz

## 2016-09-24 LAB — TROPONIN I: TROPONIN I: 0.08 ng/mL — AB (ref <0.03)

## 2016-09-24 LAB — MAGNESIUM: MAGNESIUM: 1.8 mg/dL (ref 1.7–2.4)

## 2016-09-24 LAB — HEPARIN LEVEL (UNFRACTIONATED): HEPARIN UNFRACTIONATED: 0.37 [IU]/mL (ref 0.30–0.70)

## 2016-09-24 MED ORDER — METOPROLOL TARTRATE 5 MG/5ML IV SOLN
INTRAVENOUS | Status: AC
  Administered 2016-09-24: 5 mg via INTRAVENOUS
  Filled 2016-09-24: qty 5

## 2016-09-24 MED ORDER — LISINOPRIL 10 MG PO TABS
10.0000 mg | ORAL_TABLET | Freq: Every day | ORAL | Status: DC
  Administered 2016-09-24: 10 mg via ORAL
  Filled 2016-09-24: qty 1

## 2016-09-24 MED ORDER — LISINOPRIL 10 MG PO TABS
10.0000 mg | ORAL_TABLET | Freq: Once | ORAL | Status: AC
  Administered 2016-09-24: 10 mg via ORAL
  Filled 2016-09-24: qty 1

## 2016-09-24 MED ORDER — POTASSIUM CHLORIDE CRYS ER 20 MEQ PO TBCR
40.0000 meq | EXTENDED_RELEASE_TABLET | ORAL | Status: AC
  Administered 2016-09-24 (×2): 40 meq via ORAL
  Filled 2016-09-24 (×2): qty 2

## 2016-09-24 MED ORDER — LISINOPRIL 20 MG PO TABS
20.0000 mg | ORAL_TABLET | Freq: Every day | ORAL | Status: DC
  Administered 2016-09-25: 20 mg via ORAL
  Filled 2016-09-24: qty 2

## 2016-09-24 MED ORDER — HEPARIN (PORCINE) IN NACL 100-0.45 UNIT/ML-% IJ SOLN
800.0000 [IU]/h | INTRAMUSCULAR | Status: DC
  Administered 2016-09-24 – 2016-09-25 (×2): 800 [IU]/h via INTRAVENOUS
  Filled 2016-09-24: qty 250

## 2016-09-24 MED ORDER — IBUPROFEN 200 MG PO TABS
400.0000 mg | ORAL_TABLET | Freq: Four times a day (QID) | ORAL | Status: DC | PRN
  Administered 2016-09-24 – 2016-09-25 (×4): 400 mg via ORAL
  Filled 2016-09-24: qty 2
  Filled 2016-09-24 (×4): qty 1

## 2016-09-24 MED ORDER — METOPROLOL TARTRATE 5 MG/5ML IV SOLN
5.0000 mg | Freq: Once | INTRAVENOUS | Status: AC
  Administered 2016-09-24: 5 mg via INTRAVENOUS
  Filled 2016-09-24: qty 5

## 2016-09-24 MED ORDER — CARVEDILOL 12.5 MG PO TABS
12.5000 mg | ORAL_TABLET | Freq: Two times a day (BID) | ORAL | Status: DC
  Administered 2016-09-24 – 2016-09-27 (×6): 12.5 mg via ORAL
  Filled 2016-09-24 (×7): qty 1

## 2016-09-24 MED ORDER — OXYMETAZOLINE HCL 0.05 % NA SOLN: 1.0000 | Freq: Two times a day (BID) | NASAL | Status: DC | PRN

## 2016-09-24 MED ORDER — HEPARIN BOLUS VIA INFUSION
3000.0000 [IU] | Freq: Once | INTRAVENOUS | Status: AC
  Administered 2016-09-24: 3000 [IU] via INTRAVENOUS
  Filled 2016-09-24: qty 3000

## 2016-09-24 MED ORDER — HYDRALAZINE HCL 25 MG PO TABS
25.0000 mg | ORAL_TABLET | Freq: Four times a day (QID) | ORAL | Status: DC | PRN
  Administered 2016-09-24: 25 mg via ORAL
  Filled 2016-09-24: qty 1

## 2016-09-24 MED ORDER — METOPROLOL TARTRATE 5 MG/5ML IV SOLN
5.0000 mg | Freq: Once | INTRAVENOUS | Status: AC
  Administered 2016-09-24: 5 mg via INTRAVENOUS

## 2016-09-24 MED ORDER — OXYMETAZOLINE HCL 0.05 % NA SOLN
1.0000 | Freq: Two times a day (BID) | NASAL | Status: DC | PRN
  Filled 2016-09-24: qty 15

## 2016-09-24 NOTE — Progress Notes (Signed)
*  PRELIMINARY RESULTS* Echocardiogram 2D Echocardiogram has been performed.  [name] 09/24/2016, 12:45 PM

## 2016-09-24 NOTE — Progress Notes (Signed)
ANTICOAGULATION CONSULT NOTE - Initial Consult  Pharmacy Consult for Heparin Indication: chest pain/ACS  Allergies  Allergen Reactions  . Tape Rash    ADHESIVE TAPE   Patient Measurements: Height: 5' 3"  (160 cm) Weight: 148 lb 3.2 oz (67.2 kg) IBW/kg (Calculated) : 52.4 HEPARIN DW (KG): 66  Vital Signs: Temp: 97.9 F (36.6 C) (01/17 0405) Temp Source: Oral (01/17 0405) BP: 169/52 (01/17 0526) Pulse Rate: 102 (01/17 0526)  Labs:  Recent Labs  09/23/16 1106  09/23/16 1751 09/23/16 2323 09/24/16 0603  HGB 12.5  --   --   --   --   HCT 38.2  --   --   --   --   PLT 184  --   --   --   --   CREATININE 0.98  --   --   --   --   TROPONINI 0.05*  < > 0.05* 0.05* 0.08*  < > = values in this interval not displayed.  Estimated Creatinine Clearance: 46.4 mL/min (by C-G formula based on SCr of 0.98 mg/dL).  Medical History: Past Medical History:  Diagnosis Date  . Anemia   . Aortic insufficiency 07/04/2011   most recent 2D Echo 02/24/2012  EF greater than 55%  . Aortic sclerosis 07/04/2011  . Back pain   . Carotid stenosis, bilateral 07/04/2011   most recent 05/2010 carotid doppler  . Cirrhosis ([place])   . Coronary artery disease   . CVA (cerebral infarction)    03/07/2014  . DDD (degenerative disc disease), lumbar   . Diabetes mellitus 2005  . GERD (gastroesophageal reflux disease)   . Hiatal hernia   . HTN (hypertension) 07/04/2011  . Hypercholesteremia   . IBS (irritable bowel syndrome)   . Myocardial infarction 1995   required 2 stents  . Non-alcoholic fatty liver disease   . OSA (obstructive sleep apnea) 07/04/2011   sleep study 2009  AHI 9.91/hr and during REM sleep 35.58/hr  . Presence of stent in right coronary artery 07/04/2011  . Stroke ([place]) 03/07/2014   no deficits from stroke  . UTI (lower urinary tract infection)    chronic/recurring/on bactrim  . Varices, esophageal ([facility])    Medications:  Prescriptions Prior to Admission  Medication Sig  Dispense Refill Last Dose  . acetaminophen (TYLENOL) 500 MG tablet Take 1,000-1,500 mg by mouth every 6 (six) hours as needed for moderate pain.   Taking  . aspirin EC 81 MG tablet Take 81 mg by mouth daily.   09/22/2016 at Unknown time  . atorvastatin (LIPITOR) 40 MG tablet Take 40 mg by mouth daily. Reported on 12/17/2015   09/22/2016 at Unknown time  . carvedilol (COREG) 6.25 MG tablet Take 6.25 mg by mouth 2 (two) times daily.   09/22/2016 at 2230  . clopidogrel (PLAVIX) 75 MG tablet Take 1 tablet (75 mg total) by mouth daily. 30 tablet 2 09/22/2016 at 2230  . isosorbide mononitrate (IMDUR) 30 MG 24 hr tablet Take 1 tablet by mouth daily.   09/22/2016 at Unknown time  . omeprazole (PRILOSEC) 20 MG capsule Take 1 capsule (20 mg total) by mouth 2 (two) times daily before a meal. 60 capsule 3 09/22/2016 at Unknown time  . ondansetron (ZOFRAN) 8 MG tablet Take 1 tablet (8 mg total) by mouth every 8 (eight) hours as needed for nausea or vomiting. 20 tablet 2 09/23/2016 at Unknown time  . zolpidem (AMBIEN) 10 MG tablet Take 10 mg by mouth at bedtime.   09/22/2016 at  Unknown time   Assessment: 76yo female with h/o CP.  Asked to initiate Heparin for ACS.  Pt on Plavix PTA but no anticoagulants.    Goal of Therapy:  Heparin level 0.3-0.7 units/ml Monitor platelets by anticoagulation protocol: Yes   Plan:  Heparin 3000 units IV bolus now x 1 Heparin infusion at 800 units/hr Heparin level in 6-8 hours then daily CBC daily while on Heparin  [place] Crane, Jazyah Butsch A 09/24/2016,10:37 AM

## 2016-09-24 NOTE — Progress Notes (Addendum)
PROGRESS NOTE  Rethel Sebek Hillery EXB:[id number] DOB: June 06, 1941 DOA: 09/23/2016 PCP: Glo Herring., MD  Brief Narrative: 76 year old woman PMH coronary artery disease, MI, 2 stents 1995 and episode of chest pain, nausea and vomiting. Nonexertional. Admitted for further evaluation of chest pain, cardiology consultation.  Assessment/Plan 1. Chest pain will elevated troponin with slight increase. EKG nonacute. Cardiology concern for ACS. Heparin infusion started. Plan transfer to [facility] 1/18 for catheterization, not today secondary to inclement weather/snow. 2. Elevated troponin. Thought secondary to elevated blood pressure. 3. Accelerated hypertension. Asymptomatic. Continue Coreg. Lisinopril added by cardiology. 4. Coronary artery disease, PMH MI, status post 2 stents 1995. 5. Diet-controlled diabetes mellitus type 2. Stable. 6. Hypertension. Continue Coreg, lisinopril added by cardiology.   Appears stable. Continue heparin, medical management as per cardiology. Blood pressure remains labile. Lisinopril has been added.  Plan transfer to [facility] 1/18 as per cardiology.  ADDENDUM 1800 This afternoon developed asymptomatic narrow complex tachycardia, exacerbated by exertion. No SOB or CP.  Tele ST <150, with bursts of SVT up to 200. Frequent ectopy.  Improved with IV metoprolol x1, but worse with exertion. Transferred to SDU in case more aggressive management needed. Repeat IV metoprolol >> EKG SR with transient SVT. Tele now SR/ST with brief SVT.  Plan >> increase BB, check BMP and Mg.   DVT prophylaxis: heparin Code Status: DNR Family Communication: none Disposition Plan: to [facility]   Murray Hodgkins, MD  [facility] Direct contact: [phone] --Via amion app OR  --[email or web address]; password [email or web address]  7PM-7AM contact night coverage as above 09/24/2016, 11:06 AM  LOS: 0 days   Consultants:  Cardiology   Procedures:    Antimicrobials:    Interval  history/Subjective: Cardiology concern for unstable angina given slightly elevated troponin. Started on heparin. Plans for transfer tomorrow and left heart catheterization.  Feels fine now. No chest pain or shortness of breath.  Objective: Vitals:   09/24/16 0112 09/24/16 0245 09/24/16 0405 09/24/16 0526  BP: (!) 201/65 (!) 184/58 (!) 194/71 (!) 169/52  Pulse: 97 97 (!) 107 (!) 102  Resp:   18 18  Temp:   97.9 F (36.6 C)   TempSrc:   Oral   SpO2:   97%   Weight:      Height:       No intake or output data in the 24 hours ending 09/24/16 1106   Filed Weights   09/23/16 1051 09/23/16 1756  Weight: 68 kg (150 lb) 67.2 kg (148 lb 3.2 oz)    Exam:    Constitutional:   Appears calm, comfortable. Resting in bed, watching TV. Respiratory:   Clear to auscultation bilaterally. No wheezes, rales or rhonchi. Normal respiratory effort. Cardiovascular:   Regular rate and rhythm. No murmur, rub or gallop. No lower extremity edema.  Telemetry sinus rhythm. Psychiatric:   Alert. Speech fluent and clear. Grossly normal mood and affect.   I have personally reviewed following labs and imaging studies:  TSH within normal limits  Troponin 0.05 >> 0.08  Scheduled Meds: . aspirin EC  81 mg Oral Daily  . atorvastatin  40 mg Oral Daily  . carvedilol  6.25 mg Oral BID  . clopidogrel  75 mg Oral Daily  . heparin  3,000 Units Intravenous Once  . insulin aspart  0-9 Units Subcutaneous TID WC  . isosorbide mononitrate  30 mg Oral Daily  . [START ON 09/25/2016] lisinopril  20 mg Oral Daily  . pantoprazole  40 mg Oral Daily  .  zolpidem  5 mg Oral QHS   Continuous Infusions: . heparin      Principal Problem:   Chest pain Active Problems:   DM type 2 (diabetes mellitus, type 2) (HCC)   Hypercholesteremia   HTN (hypertension)   Hypokalemia   Elevated troponin   LOS: 0 days

## 2016-09-24 NOTE — Care Management Note (Signed)
Case Management Note  Patient Details  Name: CEASIA ELWELL MRN: [id number] Date of Birth: 10/08/1940  Subjective/Objective: Patient adm with CP. She lives alone, ind with ADL's. NO DME or HH PTA. She still drives, has PCP and reports no issues affording medications. She states she will transfer to [facility] tomorrow for heart cath.                    Action/Plan: Anticipate DC home with self care.   Expected Discharge Date:     09/25/2016             Expected Discharge Plan:  Home/Self Care  In-House Referral:  NA  Discharge planning Services  CM Consult  Post Acute Care Choice:  NA Choice offered to:  NA  DME Arranged:    DME Agency:     HH Arranged:    HH Agency:     Status of Service:  In process, will continue to follow  If discussed at Long Length of Stay Meetings, dates discussed:    Additional Comments:  Felma Pfefferle, Chauncey Reading, RN 09/24/2016, 1:16 PM

## 2016-09-24 NOTE — Care Management Obs Status (Signed)
[facility] NOTIFICATION   Patient Details  Name: TREAZURE NERY MRN: [id number] Date of Birth: 1940-11-25   Medicare Observation Status Notification Given:  Yes    Noemi Bellissimo, Chauncey Reading, RN 09/24/2016, 1:17 PM

## 2016-09-24 NOTE — Progress Notes (Signed)
Critical lab value received. Troponin 0.08. Dr. Darrick Meigs had been notified @ 6:52. Awaiting for order.

## 2016-09-24 NOTE — Progress Notes (Signed)
     Subjective:    No chest pain or SOB overnight  Objective:   Temp:  [97.7 F (36.5 C)-98.5 F (36.9 C)] 97.9 F (36.6 C) (01/17 0405) Pulse Rate:  [80-107] 102 (01/17 0526) Resp:  [11-21] 18 (01/17 0526) BP: (159-201)/(49-101) 169/52 (01/17 0526) SpO2:  [87 %-99 %] 97 % (01/17 0405) Weight:  [148 lb 3.2 oz (67.2 kg)-150 lb (68 kg)] 148 lb 3.2 oz (67.2 kg) (01/16 1756) Last BM Date: 09/22/16  Filed Weights   09/23/16 1051 09/23/16 1756  Weight: 150 lb (68 kg) 148 lb 3.2 oz (67.2 kg)   No intake or output data in the 24 hours ending 09/24/16 0958  Telemetry: SR and sinus tach  Exam:  General: NAD  HEENT: sclera clear, throat clear  Resp: CTAB  Cardiac: RRR, 2/6 systolic murmur RUSB, no jvd  GI: abdomen soft, NT, ND  MSK: no LE edema  Neuro:  No focal deficits  Psych: appropriate affect  Lab Results:  Basic Metabolic Panel:  Recent Labs Lab 09/23/16 1106  NA 138  K 2.9*  CL 106  CO2 23  GLUCOSE 152*  BUN 16  CREATININE 0.98  CALCIUM 9.4    Liver Function Tests: No results for input(s): AST, ALT, ALKPHOS, BILITOT, PROT, ALBUMIN in the last 168 hours.  CBC:  Recent Labs Lab 09/23/16 1106  WBC 7.3  HGB 12.5  HCT 38.2  MCV 83.8  PLT 184    Cardiac Enzymes:  Recent Labs Lab 09/23/16 1751 09/23/16 2323 09/24/16 0603  TROPONINI 0.05* 0.05* 0.08*    BNP: No results for input(s): PROBNP in the last 8760 hours.  Coagulation: No results for input(s): INR in the last 168 hours.  ECG:   Medications:   Scheduled Medications: . aspirin EC  81 mg Oral Daily  . atorvastatin  40 mg Oral Daily  . carvedilol  6.25 mg Oral BID  . clopidogrel  75 mg Oral Daily  . enoxaparin (LOVENOX) injection  40 mg Subcutaneous Q24H  . insulin aspart  0-9 Units Subcutaneous TID WC  . isosorbide mononitrate  30 mg Oral Daily  . lisinopril  10 mg Oral Daily  . pantoprazole  40 mg Oral Daily  . zolpidem  5 mg Oral QHS     Infusions:   PRN  Medications:  acetaminophen, gi cocktail, hydrALAZINE, ibuprofen, morphine injection, ondansetron (ZOFRAN) IV, oxymetazoline, sodium chloride     Assessment/Plan   1. Chest pain/CAD - - isolated fairly mild short episode of chest pain - mild troponin uptrend to 0.08. EKG without specific ischemic changes - given her chest pain, history, and mild troponin elevation with uptrend concern for possible ACS. We will start heparin gtt. Plan for cath, her procedure is not urgent and due to the weather we will plan for transfer tomorrow.  2. HTN - remains elevated, started lisinopril yesterday. Increase to 73m daily.       JCarlyle Dolly M.D

## 2016-09-24 NOTE — Progress Notes (Signed)
Pt transferring to ICU Stepdown. Report called.

## 2016-09-25 ENCOUNTER — Observation Stay (HOSPITAL_COMMUNITY): Payer: Medicare Other

## 2016-09-25 DIAGNOSIS — Z79899 Other long term (current) drug therapy: Secondary | ICD-10-CM | POA: Diagnosis not present

## 2016-09-25 DIAGNOSIS — I471 Supraventricular tachycardia: Secondary | ICD-10-CM | POA: Diagnosis not present

## 2016-09-25 DIAGNOSIS — R0789 Other chest pain: Secondary | ICD-10-CM | POA: Diagnosis not present

## 2016-09-25 DIAGNOSIS — I4719 Other supraventricular tachycardia: Secondary | ICD-10-CM

## 2016-09-25 DIAGNOSIS — K219 Gastro-esophageal reflux disease without esophagitis: Secondary | ICD-10-CM | POA: Diagnosis present

## 2016-09-25 DIAGNOSIS — R079 Chest pain, unspecified: Secondary | ICD-10-CM | POA: Diagnosis not present

## 2016-09-25 DIAGNOSIS — E7800 Pure hypercholesterolemia, unspecified: Secondary | ICD-10-CM | POA: Diagnosis not present

## 2016-09-25 DIAGNOSIS — I252 Old myocardial infarction: Secondary | ICD-10-CM | POA: Diagnosis not present

## 2016-09-25 DIAGNOSIS — N179 Acute kidney failure, unspecified: Secondary | ICD-10-CM | POA: Diagnosis not present

## 2016-09-25 DIAGNOSIS — Z8249 Family history of ischemic heart disease and other diseases of the circulatory system: Secondary | ICD-10-CM | POA: Diagnosis not present

## 2016-09-25 DIAGNOSIS — I214 Non-ST elevation (NSTEMI) myocardial infarction: Secondary | ICD-10-CM | POA: Diagnosis not present

## 2016-09-25 DIAGNOSIS — E119 Type 2 diabetes mellitus without complications: Secondary | ICD-10-CM | POA: Diagnosis not present

## 2016-09-25 DIAGNOSIS — I10 Essential (primary) hypertension: Secondary | ICD-10-CM | POA: Diagnosis not present

## 2016-09-25 DIAGNOSIS — Z7902 Long term (current) use of antithrombotics/antiplatelets: Secondary | ICD-10-CM | POA: Diagnosis not present

## 2016-09-25 DIAGNOSIS — I2510 Atherosclerotic heart disease of native coronary artery without angina pectoris: Secondary | ICD-10-CM | POA: Diagnosis not present

## 2016-09-25 DIAGNOSIS — Z833 Family history of diabetes mellitus: Secondary | ICD-10-CM | POA: Diagnosis not present

## 2016-09-25 DIAGNOSIS — Z955 Presence of coronary angioplasty implant and graft: Secondary | ICD-10-CM | POA: Diagnosis not present

## 2016-09-25 DIAGNOSIS — R42 Dizziness and giddiness: Secondary | ICD-10-CM | POA: Diagnosis present

## 2016-09-25 DIAGNOSIS — Z9841 Cataract extraction status, right eye: Secondary | ICD-10-CM | POA: Diagnosis not present

## 2016-09-25 DIAGNOSIS — G4733 Obstructive sleep apnea (adult) (pediatric): Secondary | ICD-10-CM | POA: Diagnosis present

## 2016-09-25 DIAGNOSIS — I249 Acute ischemic heart disease, unspecified: Secondary | ICD-10-CM | POA: Diagnosis not present

## 2016-09-25 DIAGNOSIS — R112 Nausea with vomiting, unspecified: Secondary | ICD-10-CM | POA: Diagnosis present

## 2016-09-25 DIAGNOSIS — I352 Nonrheumatic aortic (valve) stenosis with insufficiency: Secondary | ICD-10-CM | POA: Diagnosis present

## 2016-09-25 DIAGNOSIS — Z87891 Personal history of nicotine dependence: Secondary | ICD-10-CM | POA: Diagnosis not present

## 2016-09-25 DIAGNOSIS — R748 Abnormal levels of other serum enzymes: Secondary | ICD-10-CM | POA: Diagnosis not present

## 2016-09-25 DIAGNOSIS — Z66 Do not resuscitate: Secondary | ICD-10-CM | POA: Diagnosis present

## 2016-09-25 DIAGNOSIS — Z961 Presence of intraocular lens: Secondary | ICD-10-CM | POA: Diagnosis present

## 2016-09-25 DIAGNOSIS — Z8673 Personal history of transient ischemic attack (TIA), and cerebral infarction without residual deficits: Secondary | ICD-10-CM | POA: Diagnosis not present

## 2016-09-25 LAB — BASIC METABOLIC PANEL
Anion gap: 9 (ref 5–15)
BUN: 15 mg/dL (ref 6–20)
CHLORIDE: 109 mmol/L (ref 101–111)
CO2: 23 mmol/L (ref 22–32)
CREATININE: 1.1 mg/dL — AB (ref 0.44–1.00)
Calcium: 9.2 mg/dL (ref 8.9–10.3)
GFR calc non Af Amer: 48 mL/min — ABNORMAL LOW (ref >60)
GFR, EST AFRICAN AMERICAN: 56 mL/min — AB (ref >60)
Glucose, Bld: 116 mg/dL — ABNORMAL HIGH (ref 65–99)
POTASSIUM: 3.9 mmol/L (ref 3.5–5.1)
Sodium: 141 mmol/L (ref 135–145)

## 2016-09-25 LAB — MAGNESIUM: MAGNESIUM: 1.8 mg/dL (ref 1.7–2.4)

## 2016-09-25 LAB — CBC
HEMATOCRIT: 36.8 % (ref 36.0–46.0)
HEMOGLOBIN: 12 g/dL (ref 12.0–15.0)
MCH: 27.5 pg (ref 26.0–34.0)
MCHC: 32.6 g/dL (ref 30.0–36.0)
MCV: 84.2 fL (ref 78.0–100.0)
Platelets: 171 10*3/uL (ref 150–400)
RBC: 4.37 MIL/uL (ref 3.87–5.11)
RDW: 14.5 % (ref 11.5–15.5)
WBC: 6.3 10*3/uL (ref 4.0–10.5)

## 2016-09-25 LAB — HEPARIN LEVEL (UNFRACTIONATED): HEPARIN UNFRACTIONATED: 0.4 [IU]/mL (ref 0.30–0.70)

## 2016-09-25 LAB — TROPONIN I: Troponin I: 0.29 ng/mL (ref <0.03)

## 2016-09-25 LAB — MRSA PCR SCREENING: MRSA BY PCR: NEGATIVE

## 2016-09-25 LAB — GLUCOSE, CAPILLARY
GLUCOSE-CAPILLARY: 176 mg/dL — AB (ref 65–99)
GLUCOSE-CAPILLARY: 75 mg/dL (ref 65–99)
GLUCOSE-CAPILLARY: 194 mg/dL — AB (ref 65–99)

## 2016-09-25 MED ORDER — LISINOPRIL 20 MG PO TABS
20.0000 mg | ORAL_TABLET | Freq: Every day | ORAL | Status: DC
Start: 2016-09-27 — End: 2016-09-27
  Administered 2016-09-27: 20 mg via ORAL
  Filled 2016-09-25: qty 1

## 2016-09-25 MED ORDER — IOPAMIDOL (ISOVUE-370) INJECTION 76%
100.0000 mL | Freq: Once | INTRAVENOUS | Status: AC | PRN
  Administered 2016-09-25: 100 mL via INTRAVENOUS

## 2016-09-25 MED ORDER — SODIUM CHLORIDE 0.9 % WEIGHT BASED INFUSION
1.0000 mL/kg/h | INTRAVENOUS | Status: DC
  Administered 2016-09-25 – 2016-09-26 (×2): 1 mL/kg/h via INTRAVENOUS

## 2016-09-25 NOTE — Progress Notes (Addendum)
PROGRESS NOTE  [name] AJG:[id number] DOB: Nov 27, 1940 DOA: 09/23/2016 PCP: Glo Herring., MD  Brief Narrative: 76 year old woman PMH coronary artery disease, MI, 2 stents 1995 and episode of chest pain, nausea and vomiting. Nonexertional. Admitted for further evaluation of chest pain, cardiology consultation.  Assessment/Plan 1. Chest pain will elevated troponin. Troponin is somewhat elevated today but patient asymptomatic. Concern for acute coronary syndrome. EKG nonacute. Continue heparin infusion, aspirin, beta blocker. Discussed with Dr. Harl Bowie, plan to rule out PE, if negative, plan transfer to [facility] for heart catheterization 2. Multifocal atrial tachycardia, currently in sinus rhythm. Coreg increased last evening. Electrolytes acceptable. As noted by cardiology, plan for CT chest. 3. Hypertension. Now well-controlled with increased Coreg, lisinopril. 4. Diabetes mellitus type 2. Remains stable. 5. Hypertension. Continue Coreg, lisinopril added by cardiology.   Appears much improved. We'll continue current treatment as recommended by cardiology, follow-up CTA chest. She is already anticoagulated on heparin, echocardiogram without features to suggest right heart strain.  ADDENDUM D/W Dr. Harl Bowie, CTA chest negative, recommends transfer to [facility] on cardiology service for [facility].   DVT prophylaxis: heparin Code Status: DNR Family Communication: none Disposition Plan: to [facility]   Murray Hodgkins, MD  [facility] Direct contact: [phone] --Via amion app OR  --[email or web address]; password [email or web address]  7PM-7AM contact night coverage as above 09/25/2016, 10:03 AM  LOS: 0 days   Consultants:  Cardiology   Procedures:  Echo Study Conclusions  - Left ventricle: The cavity size was normal. Wall thickness was   increased in a pattern of mild LVH. Systolic function was   vigorous. The estimated ejection fraction was in the range of 65%   to 70%. The study is not technically  sufficient to allow   evaluation of LV diastolic function. - Aortic valve: Mildly calcified annulus. Trileaflet; mildly   thickened leaflets. There was mild stenosis. There was moderate   regurgitation. Valve area (VTI): 1.98 cm^2. Valve area (Vmax):   1.87 cm^2. Valve area (Vmean): 1.93 cm^2. - Mitral valve: Severely calcified annulus. Mildly thickened   leaflets . - Left atrium: The atrium was severely dilated. - Atrial septum: No defect or patent foramen ovale was identified. - Technically adequate study.  Antimicrobials:    Interval history/Subjective: Feels better. No issues overnight. No chest pain or shortness of breath. No palpitations.  Objective: Vitals:   09/25/16 0500 09/25/16 0600 09/25/16 0800 09/25/16 0900  BP: (!) 128/38 (!) 142/50 (!) 93/38 (!) 117/37  Pulse: 83 84 88 91  Resp: 12 13 16 18   Temp:   97.6 F (36.4 C)   TempSrc:      SpO2: 95% 95% 94% 94%  Weight: 66.7 kg (147 lb 0.8 oz)     Height:        Intake/Output Summary (Last 24 hours) at 09/25/16 1003 Last data filed at 09/25/16 0700  Gross per 24 hour  Intake           394.53 ml  Output                0 ml  Net           394.53 ml     Filed Weights   09/23/16 1756 09/24/16 1855 09/25/16 0500  Weight: 67.2 kg (148 lb 3.2 oz) 66.1 kg (145 lb 11.6 oz) 66.7 kg (147 lb 0.8 oz)    Exam:   Constitutional:   Appears calm, comfortable this morning Respiratory:   Clear to auscultation bilaterally. No wheezes, rales, rhonchi. Normal  respiratory effort. Cardiovascular:   Regular rate and rhythm. No murmur, rub or gallop. No lower extremity edema.  Telemetry sinus rhythm. Psychiatric:   Alert, speech fluent and clear.     I have personally reviewed following labs and imaging studies: Potassium 3.9, normal BUN, creatinine unremarkable, magnesium within normal limits. Troponin 0.29 CBC unremarkable  Scheduled Meds: . aspirin EC  81 mg Oral Daily  . atorvastatin  40 mg Oral Daily  .  carvedilol  12.5 mg Oral BID  . clopidogrel  75 mg Oral Daily  . insulin aspart  0-9 Units Subcutaneous TID WC  . isosorbide mononitrate  30 mg Oral Daily  . lisinopril  20 mg Oral Daily  . pantoprazole  40 mg Oral Daily  . zolpidem  5 mg Oral QHS   Continuous Infusions: . heparin 800 Units/hr (09/25/16 0700)    Principal Problem:   Chest pain Active Problems:   DM type 2 (diabetes mellitus, type 2) (HCC)   Hypercholesteremia   HTN (hypertension)   Hypokalemia   Elevated troponin   Multifocal atrial tachycardia ([place])   LOS: 0 days

## 2016-09-25 NOTE — Progress Notes (Signed)
[place] for Heparin Indication: chest pain/ACS vs r/o PE  Allergies  Allergen Reactions  . Tape Rash    ADHESIVE TAPE   Patient Measurements: Height: 5' 3"  (160 cm) Weight: 147 lb 0.8 oz (66.7 kg) IBW/kg (Calculated) : 52.4 HEPARIN DW (KG): 65.7  Vital Signs: Temp: 97.6 F (36.4 C) (01/18 0800) Temp Source: Oral (01/18 0400) BP: 117/37 (01/18 0900) Pulse Rate: 91 (01/18 0900)  Labs:  Recent Labs  09/23/16 1106  09/23/16 2323 09/24/16 0603 09/24/16 1639 09/24/16 1944 09/25/16 0434 09/25/16 0438  HGB 12.5  --   --   --   --   --  12.0  --   HCT 38.2  --   --   --   --   --  36.8  --   PLT 184  --   --   --   --   --  171  --   HEPARINUNFRC  --   --   --   --  0.37  --  0.40  --   CREATININE 0.98  --   --   --   --  0.83 1.10*  --   TROPONINI 0.05*  < > 0.05* 0.08*  --   --   --  0.29*  < > = values in this interval not displayed.  Estimated Creatinine Clearance: 41.2 mL/min (by C-G formula based on SCr of 1.1 mg/dL (H)).  Medical History: Past Medical History:  Diagnosis Date  . Anemia   . Aortic insufficiency 07/04/2011   most recent 2D Echo 02/24/2012  EF greater than 55%  . Aortic sclerosis 07/04/2011  . Back pain   . Carotid stenosis, bilateral 07/04/2011   most recent 05/2010 carotid doppler  . Cirrhosis ([place])   . Coronary artery disease   . CVA (cerebral infarction)    03/07/2014  . DDD (degenerative disc disease), lumbar   . Diabetes mellitus 2005  . GERD (gastroesophageal reflux disease)   . Hiatal hernia   . HTN (hypertension) 07/04/2011  . Hypercholesteremia   . IBS (irritable bowel syndrome)   . Myocardial infarction 1995   required 2 stents  . Non-alcoholic fatty liver disease   . OSA (obstructive sleep apnea) 07/04/2011   sleep study 2009  AHI 9.91/hr and during REM sleep 35.58/hr  . Presence of stent in right coronary artery 07/04/2011  . Stroke ([place]) 03/07/2014   no deficits from stroke  . UTI  (lower urinary tract infection)    chronic/recurring/on bactrim  . Varices, esophageal (HCC)    Medications:  Prescriptions Prior to Admission  Medication Sig Dispense Refill Last Dose  . acetaminophen (TYLENOL) 500 MG tablet Take 1,000-1,500 mg by mouth every 6 (six) hours as needed for moderate pain.   Taking  . aspirin EC 81 MG tablet Take 81 mg by mouth daily.   09/22/2016 at Unknown time  . atorvastatin (LIPITOR) 40 MG tablet Take 40 mg by mouth daily. Reported on 12/17/2015   09/22/2016 at Unknown time  . carvedilol (COREG) 6.25 MG tablet Take 6.25 mg by mouth 2 (two) times daily.   09/22/2016 at 2230  . clopidogrel (PLAVIX) 75 MG tablet Take 1 tablet (75 mg total) by mouth daily. 30 tablet 2 09/22/2016 at 2230  . isosorbide mononitrate (IMDUR) 30 MG 24 hr tablet Take 1 tablet by mouth daily.   09/22/2016 at Unknown time  . omeprazole (PRILOSEC) 20 MG capsule Take 1 capsule (20 mg total) by mouth  2 (two) times daily before a meal. 60 capsule 3 09/22/2016 at Unknown time  . ondansetron (ZOFRAN) 8 MG tablet Take 1 tablet (8 mg total) by mouth every 8 (eight) hours as needed for nausea or vomiting. 20 tablet 2 09/23/2016 at Unknown time  . zolpidem (AMBIEN) 10 MG tablet Take 10 mg by mouth at bedtime.   09/22/2016 at Unknown time   Assessment: 76yo female with h/o CP.  Heparin for ACS.  Heparin level at goal, no bleeding noted.   Goal of Therapy:  Heparin level 0.3-0.7 units/ml Monitor platelets by anticoagulation protocol: Yes   Plan:  Continue Heparin infusion at 800 units/hr Heparin level daily CBC daily while on Heparin  Pricilla Larsson 09/25/2016,10:08 AM

## 2016-09-25 NOTE — Progress Notes (Addendum)
Received from [facility] via [facility] ambulance transport. [facility] admit team notified via Text messaging.   Cardiology PA text messaged of pt's arrival as transfer to [phone].

## 2016-09-25 NOTE — Progress Notes (Signed)
CT PE negative. Discussed with hospitalist who will arrange transfer to [facility] cardiology team, plan for heart cath tomorrow. DOD Dr Stanford Breed and Wannetta Sender made aware and to be listed for cath tomorrow   Zandra Abts MD

## 2016-09-25 NOTE — Progress Notes (Signed)
Subjective:    Palpitations yesterday. No chest pain.   Objective:   Temp:  [97.6 F (36.4 C)-98.4 F (36.9 C)] 97.6 F (36.4 C) (01/18 0800) Pulse Rate:  [69-147] 88 (01/18 0800) Resp:  [12-28] 16 (01/18 0800) BP: (80-201)/(32-88) 93/38 (01/18 0800) SpO2:  [91 %-99 %] 94 % (01/18 0800) Weight:  [145 lb 11.6 oz (66.1 kg)-147 lb 0.8 oz (66.7 kg)] 147 lb 0.8 oz (66.7 kg) (01/18 0500) Last BM Date: 09/22/16  Filed Weights   09/23/16 1756 09/24/16 1855 09/25/16 0500  Weight: 148 lb 3.2 oz (67.2 kg) 145 lb 11.6 oz (66.1 kg) 147 lb 0.8 oz (66.7 kg)    Intake/Output Summary (Last 24 hours) at 09/25/16 0910 Last data filed at 09/25/16 0700  Gross per 24 hour  Intake           394.53 ml  Output                0 ml  Net           394.53 ml    Telemetry: SR and sinus tach. MAT.   Exam:  General: NAD  HEENT: sclera clear, throat clear  Resp: CTAB  Cardiac: RRR, 2/6 systolic murmur RUSB, no jvd  GI: abdomen soft, NT, ND  MSK: no LE edema  Neuro:  No focal deficits  Psych: appropriate affect  Lab Results:  Basic Metabolic Panel:  Recent Labs Lab 09/23/16 1106 09/24/16 1944 09/25/16 0434  NA 138 139 141  K 2.9* 3.9 3.9  CL 106 108 109  CO2 23 24 23   GLUCOSE 152* 145* 116*  BUN 16 12 15   CREATININE 0.98 0.83 1.10*  CALCIUM 9.4 9.3 9.2  MG  --  1.8 1.8    Liver Function Tests: No results for input(s): AST, ALT, ALKPHOS, BILITOT, PROT, ALBUMIN in the last 168 hours.  CBC:  Recent Labs Lab 09/23/16 1106 09/25/16 0434  WBC 7.3 6.3  HGB 12.5 12.0  HCT 38.2 36.8  MCV 83.8 84.2  PLT 184 171    Cardiac Enzymes:  Recent Labs Lab 09/23/16 1751 09/23/16 2323 09/24/16 0603  TROPONINI 0.05* 0.05* 0.08*    BNP: No results for input(s): PROBNP in the last 8760 hours.  Coagulation: No results for input(s): INR in the last 168 hours.  ECG:   Medications:   Scheduled Medications: . aspirin EC  81 mg Oral Daily  . atorvastatin  40 mg  Oral Daily  . carvedilol  12.5 mg Oral BID  . clopidogrel  75 mg Oral Daily  . insulin aspart  0-9 Units Subcutaneous TID WC  . isosorbide mononitrate  30 mg Oral Daily  . lisinopril  20 mg Oral Daily  . pantoprazole  40 mg Oral Daily  . zolpidem  5 mg Oral QHS     Infusions: . heparin 800 Units/hr (09/25/16 0700)     PRN Medications:  acetaminophen, gi cocktail, hydrALAZINE, ibuprofen, morphine injection, ondansetron (ZOFRAN) IV, oxymetazoline, sodium chloride     Assessment/Plan   1. Chest pain/CAD - - isolated fairly mild short episode of chest pain - mild troponin uptrend to 0.08, repeat this AM 0.29.Marland Kitchen EKG without specific ischemic changes - echo LVEF 65-70%, no WMAs, mild AS, mod AI.   - initially thought to be ACS. Patient with sinus tachcyardia yesterday throughout the day, followed by runs of MAT in there afternoon. Given her sudden atrial arrhythmias and presentation concern for possible PE. We will order CT  PE today, if negative would plan for heart cath tomorrow.  2. HTN - remains elevated, started lisinopril yesterday. Increase to 76m daily.   3, MAT - new diagnosis this admission, long history of prior palpitations with negative cardiac monitiors. She reports no recent symptoms of palpitations until yesterday - CT PE as descriebd above. Continue beta blocker. Electrolytes at goal.  - continue heparin    JCarlyle Dolly M.D.

## 2016-09-25 NOTE — Plan of Care (Signed)
Problem: Cardiovascular: Goal: Ability to achieve and maintain adequate cardiovascular perfusion will improve Outcome: Progressing VSS stable. Denies CP or SOB at this time.

## 2016-09-26 ENCOUNTER — Encounter (HOSPITAL_COMMUNITY)
Admission: EM | Disposition: A | Discharge status: Home / Self Care | Payer: Self-pay | Source: Home / Self Care | Attending: Family Medicine

## 2016-09-26 DIAGNOSIS — E7800 Pure hypercholesterolemia, unspecified: Secondary | ICD-10-CM

## 2016-09-26 DIAGNOSIS — R748 Abnormal levels of other serum enzymes: Secondary | ICD-10-CM

## 2016-09-26 DIAGNOSIS — R197 Diarrhea, unspecified: Secondary | ICD-10-CM

## 2016-09-26 DIAGNOSIS — I249 Acute ischemic heart disease, unspecified: Principal | ICD-10-CM

## 2016-09-26 HISTORY — PX: CARDIAC CATHETERIZATION: SHX172

## 2016-09-26 LAB — CBC
HCT: 35.7 % — ABNORMAL LOW (ref 36.0–46.0)
Hemoglobin: 11.1 g/dL — ABNORMAL LOW (ref 12.0–15.0)
MCH: 26.2 pg (ref 26.0–34.0)
MCHC: 31.1 g/dL (ref 30.0–36.0)
MCV: 84.2 fL (ref 78.0–100.0)
Platelets: 169 10*3/uL (ref 150–400)
RBC: 4.24 MIL/uL (ref 3.87–5.11)
RDW: 15.2 % (ref 11.5–15.5)
WBC: 9.2 10*3/uL (ref 4.0–10.5)

## 2016-09-26 LAB — BASIC METABOLIC PANEL WITH GFR
Anion gap: 9 (ref 5–15)
BUN: 21 mg/dL — ABNORMAL HIGH (ref 6–20)
CO2: 20 mmol/L — ABNORMAL LOW (ref 22–32)
Calcium: 8.9 mg/dL (ref 8.9–10.3)
Chloride: 111 mmol/L (ref 101–111)
Creatinine, Ser: 1.3 mg/dL — ABNORMAL HIGH (ref 0.44–1.00)
GFR calc Af Amer: 46 mL/min — ABNORMAL LOW
GFR calc non Af Amer: 39 mL/min — ABNORMAL LOW
Glucose, Bld: 163 mg/dL — ABNORMAL HIGH (ref 65–99)
Potassium: 3.8 mmol/L (ref 3.5–5.1)
Sodium: 140 mmol/L (ref 135–145)

## 2016-09-26 LAB — C DIFFICILE QUICK SCREEN W PCR REFLEX
C DIFFICILE (CDIFF) INTERP: NOT DETECTED
C DIFFICILE (CDIFF) TOXIN: NEGATIVE
C DIFFICLE (CDIFF) ANTIGEN: NEGATIVE

## 2016-09-26 LAB — GLUCOSE, CAPILLARY
GLUCOSE-CAPILLARY: 196 mg/dL — AB (ref 65–99)
Glucose-Capillary: 124 mg/dL — ABNORMAL HIGH (ref 65–99)
Glucose-Capillary: 137 mg/dL — ABNORMAL HIGH (ref 65–99)
Glucose-Capillary: 105 mg/dL — ABNORMAL HIGH (ref 65–99)

## 2016-09-26 LAB — HEPARIN LEVEL (UNFRACTIONATED): Heparin Unfractionated: 0.56 [IU]/mL (ref 0.30–0.70)

## 2016-09-26 LAB — PROTIME-INR
INR: 1.24
Prothrombin Time: 15.7 s — ABNORMAL HIGH (ref 11.4–15.2)

## 2016-09-26 SURGERY — LEFT HEART CATH AND CORONARY ANGIOGRAPHY
Anesthesia: LOCAL

## 2016-09-26 MED ORDER — IOPAMIDOL (ISOVUE-370) INJECTION 76%
INTRAVENOUS | Status: DC | PRN
  Administered 2016-09-26: 75 mL via INTRA_ARTERIAL

## 2016-09-26 MED ORDER — FENTANYL CITRATE (PF) 100 MCG/2ML IJ SOLN
INTRAMUSCULAR | Status: DC | PRN
  Administered 2016-09-26: 25 ug via INTRAVENOUS

## 2016-09-26 MED ORDER — IOPAMIDOL (ISOVUE-370) INJECTION 76%
INTRAVENOUS | Status: AC
  Filled 2016-09-26: qty 100

## 2016-09-26 MED ORDER — HEPARIN (PORCINE) IN NACL 2-0.9 UNIT/ML-% IJ SOLN
INTRAMUSCULAR | Status: AC
  Filled 2016-09-26: qty 1000

## 2016-09-26 MED ORDER — SODIUM CHLORIDE 0.9 % IV BOLUS (SEPSIS)
500.0000 mL | Freq: Once | INTRAVENOUS | Status: AC
  Administered 2016-09-26: 500 mL via INTRAVENOUS

## 2016-09-26 MED ORDER — LIDOCAINE HCL (PF) 1 % IJ SOLN
INTRAMUSCULAR | Status: AC
  Filled 2016-09-26: qty 30

## 2016-09-26 MED ORDER — LIDOCAINE HCL (PF) 1 % IJ SOLN
INTRAMUSCULAR | Status: DC | PRN
  Administered 2016-09-26: 2 mL

## 2016-09-26 MED ORDER — FENTANYL CITRATE (PF) 100 MCG/2ML IJ SOLN
INTRAMUSCULAR | Status: AC
  Filled 2016-09-26: qty 2

## 2016-09-26 MED ORDER — LOPERAMIDE HCL 2 MG PO CAPS
2.0000 mg | ORAL_CAPSULE | Freq: Four times a day (QID) | ORAL | Status: DC | PRN
  Administered 2016-09-26: 2 mg via ORAL
  Filled 2016-09-26: qty 1

## 2016-09-26 MED ORDER — HEPARIN (PORCINE) IN NACL 2-0.9 UNIT/ML-% IJ SOLN
INTRAMUSCULAR | Status: DC | PRN
  Administered 2016-09-26: 1000 mL

## 2016-09-26 MED ORDER — LABETALOL HCL 5 MG/ML IV SOLN
INTRAVENOUS | Status: DC | PRN
  Administered 2016-09-26: 10 mg via INTRAVENOUS

## 2016-09-26 MED ORDER — HEPARIN SODIUM (PORCINE) 1000 UNIT/ML IJ SOLN
INTRAMUSCULAR | Status: AC
  Filled 2016-09-26: qty 1

## 2016-09-26 MED ORDER — LOPERAMIDE HCL 2 MG PO CAPS: 2.0000 mg | ORAL_CAPSULE | ORAL | Status: DC | PRN

## 2016-09-26 MED ORDER — SODIUM CHLORIDE 0.9 % IV SOLN: 250.0000 mL | INTRAVENOUS | Status: DC | PRN

## 2016-09-26 MED ORDER — SODIUM CHLORIDE 0.9 % IV SOLN: INTRAVENOUS | Status: AC

## 2016-09-26 MED ORDER — HEPARIN SODIUM (PORCINE) 5000 UNIT/ML IJ SOLN: 5000.0000 [IU] | Freq: Three times a day (TID) | INTRAMUSCULAR | Status: DC

## 2016-09-26 MED ORDER — SODIUM CHLORIDE 0.9% FLUSH
3.0000 mL | Freq: Two times a day (BID) | INTRAVENOUS | Status: DC
  Administered 2016-09-27: 3 mL via INTRAVENOUS

## 2016-09-26 MED ORDER — HEPARIN SODIUM (PORCINE) 1000 UNIT/ML IJ SOLN
INTRAMUSCULAR | Status: DC | PRN
  Administered 2016-09-26: 3500 [IU] via INTRAVENOUS

## 2016-09-26 MED ORDER — HEPARIN SODIUM (PORCINE) 5000 UNIT/ML IJ SOLN
5000.0000 [IU] | Freq: Three times a day (TID) | INTRAMUSCULAR | Status: DC
  Filled 2016-09-26: qty 1

## 2016-09-26 MED ORDER — ONDANSETRON HCL 4 MG/5ML PO SOLN
4.0000 mg | Freq: Once | ORAL | Status: DC
  Filled 2016-09-26: qty 5

## 2016-09-26 MED ORDER — LABETALOL HCL 5 MG/ML IV SOLN
INTRAVENOUS | Status: AC
  Filled 2016-09-26: qty 4

## 2016-09-26 MED ORDER — MIDAZOLAM HCL 2 MG/2ML IJ SOLN
INTRAMUSCULAR | Status: AC
  Filled 2016-09-26: qty 2

## 2016-09-26 MED ORDER — ASPIRIN 81 MG PO CHEW
81.0000 mg | CHEWABLE_TABLET | Freq: Once | ORAL | Status: AC
Start: 2016-09-26 — End: 2016-09-26
  Administered 2016-09-26: 81 mg via ORAL
  Filled 2016-09-26: qty 1

## 2016-09-26 MED ORDER — SODIUM CHLORIDE 0.9% FLUSH: 3.0000 mL | INTRAVENOUS | Status: DC | PRN

## 2016-09-26 MED ORDER — MIDAZOLAM HCL 2 MG/2ML IJ SOLN
INTRAMUSCULAR | Status: DC | PRN
  Administered 2016-09-26: 1 mg via INTRAVENOUS

## 2016-09-26 MED ORDER — ONDANSETRON HCL 4 MG/2ML IJ SOLN
4.0000 mg | Freq: Once | INTRAMUSCULAR | Status: AC
  Administered 2016-09-26: 4 mg via INTRAVENOUS
  Filled 2016-09-26: qty 2

## 2016-09-26 MED ORDER — HEPARIN (PORCINE) IN NACL 2-0.9 UNIT/ML-% IJ SOLN
INTRAMUSCULAR | Status: DC | PRN
  Administered 2016-09-26: 17:00:00 via INTRA_ARTERIAL

## 2016-09-26 MED ORDER — VERAPAMIL HCL 2.5 MG/ML IV SOLN
INTRAVENOUS | Status: AC
  Filled 2016-09-26: qty 2

## 2016-09-26 SURGICAL SUPPLY — 13 items
CATH INFINITI 5FR ANG PIGTAIL (CATHETERS) ×1 IMPLANT
CATH OPTITORQUE TIG 4.0 5F (CATHETERS) ×1 IMPLANT
COVER PRB 48X5XTLSCP FOLD TPE (BAG) IMPLANT
COVER PROBE 5X48 (BAG) ×2
DEVICE RAD COMP TR BAND LRG (VASCULAR PRODUCTS) ×1 IMPLANT
GLIDESHEATH SLEND A-KIT 6F 22G (SHEATH) ×1 IMPLANT
GLIDESHEATH SLEND SS 6F .021 (SHEATH) ×1 IMPLANT
GUIDEWIRE INQWIRE 1.5J.035X260 (WIRE) IMPLANT
INQWIRE 1.5J .035X260CM (WIRE) ×2
KIT HEART LEFT (KITS) ×2 IMPLANT
PACK CARDIAC CATHETERIZATION (CUSTOM PROCEDURE TRAY) ×2 IMPLANT
TRANSDUCER W/STOPCOCK (MISCELLANEOUS) ×2 IMPLANT
TUBING CIL FLEX 10 FLL-RA (TUBING) ×2 IMPLANT

## 2016-09-26 NOTE — Progress Notes (Addendum)
[place] for Heparin Indication: chest pain/ACS  Allergies  Allergen Reactions  . Tape Rash    ADHESIVE TAPE   Patient Measurements: Height: 5' 3"  (160 cm) Weight: 147 lb 6.4 oz (66.9 kg) IBW/kg (Calculated) : 52.4 HEPARIN DW (KG): 65.7  Vital Signs: Temp: 98.1 F (36.7 C) (01/19 0441) Temp Source: Oral (01/19 0441) BP: 123/40 (01/19 0440) Pulse Rate: 101 (01/19 0440)  Labs:  Recent Labs  09/23/16 1106  09/23/16 2323 09/24/16 0603 09/24/16 1639 09/24/16 1944 09/25/16 0434 09/25/16 0438 09/26/16 0410  HGB 12.5  --   --   --   --   --  12.0  --  11.1*  HCT 38.2  --   --   --   --   --  36.8  --  35.7*  PLT 184  --   --   --   --   --  171  --  169  LABPROT  --   --   --   --   --   --   --   --  15.7*  INR  --   --   --   --   --   --   --   --  1.24  HEPARINUNFRC  --   --   --   --  0.37  --  0.40  --  0.56  CREATININE 0.98  --   --   --   --  0.83 1.10*  --  1.30*  TROPONINI 0.05*  < > 0.05* 0.08*  --   --   --  0.29*  --   < > = values in this interval not displayed.  Estimated Creatinine Clearance: 34.9 mL/min (by C-G formula based on SCr of 1.3 mg/dL (H)).  Medical History: Past Medical History:  Diagnosis Date  . Anemia   . Aortic insufficiency 07/04/2011   most recent 2D Echo 02/24/2012  EF greater than 55%  . Aortic sclerosis 07/04/2011  . Back pain   . Carotid stenosis, bilateral 07/04/2011   most recent 05/2010 carotid doppler  . Cirrhosis ([place])   . Coronary artery disease   . CVA (cerebral infarction)    03/07/2014  . DDD (degenerative disc disease), lumbar   . Diabetes mellitus 2005  . GERD (gastroesophageal reflux disease)   . Hiatal hernia   . HTN (hypertension) 07/04/2011  . Hypercholesteremia   . IBS (irritable bowel syndrome)   . Myocardial infarction 1995   required 2 stents  . Non-alcoholic fatty liver disease   . OSA (obstructive sleep apnea) 07/04/2011   sleep study 2009  AHI 9.91/hr and  during REM sleep 35.58/hr  . Presence of stent in right coronary artery 07/04/2011  . Stroke ([place]) 03/07/2014   no deficits from stroke  . UTI (lower urinary tract infection)    chronic/recurring/on bactrim  . Varices, esophageal (HCC)    Medications:  Prescriptions Prior to Admission  Medication Sig Dispense Refill Last Dose  . acetaminophen (TYLENOL) 500 MG tablet Take 1,000-1,500 mg by mouth every 6 (six) hours as needed for moderate pain.   Taking  . aspirin EC 81 MG tablet Take 81 mg by mouth daily.   09/22/2016 at Unknown time  . atorvastatin (LIPITOR) 40 MG tablet Take 40 mg by mouth daily. Reported on 12/17/2015   09/22/2016 at Unknown time  . carvedilol (COREG) 6.25 MG tablet Take 6.25 mg by mouth 2 (two) times daily.   09/22/2016  at 2230  . clopidogrel (PLAVIX) 75 MG tablet Take 1 tablet (75 mg total) by mouth daily. 30 tablet 2 09/22/2016 at 2230  . isosorbide mononitrate (IMDUR) 30 MG 24 hr tablet Take 1 tablet by mouth daily.   09/22/2016 at Unknown time  . omeprazole (PRILOSEC) 20 MG capsule Take 1 capsule (20 mg total) by mouth 2 (two) times daily before a meal. 60 capsule 3 09/22/2016 at Unknown time  . ondansetron (ZOFRAN) 8 MG tablet Take 1 tablet (8 mg total) by mouth every 8 (eight) hours as needed for nausea or vomiting. 20 tablet 2 09/23/2016 at Unknown time  . zolpidem (AMBIEN) 10 MG tablet Take 10 mg by mouth at bedtime.   09/22/2016 at Unknown time   Assessment: 76yo female with h/o CP, continuing on heparin for ACS. Heparin level at goal, no bleeding noted. Cath scheduled for the last case on 1/19. Not on anticoagulation PTA. CTA negative for PE.   Goal of Therapy:  Heparin level 0.3-0.7 units/ml Monitor platelets by anticoagulation protocol: Yes   Plan:  Continue Heparin infusion at 800 units/hr Heparin level/CBC daily Monitor for s/sx bleeding Cath scheduled for 1/19   Elicia Lamp, PharmD, BCPS Clinical Pharmacist 09/26/2016 8:38 AM

## 2016-09-26 NOTE — Progress Notes (Signed)
Patient called to notify RN that she had emesis x1 and had diarrhea at the same time.  [facility] p[facility] with this information.

## 2016-09-26 NOTE — Interval H&P Note (Signed)
History and Physical Interval Note:  09/26/2016 4:59 PM  [name]  has presented today for surgery, with the diagnosis of Nstemi with known CAD. The various methods of treatment have been discussed with the patient and family. After consideration of risks, benefits and other options for treatment, the patient has consented to  Procedure(s): Left Heart Cath and Coronary Angiography (N/A) with possible Percutaneous Coronary Intervention as a surgical intervention .  The patient's history has been reviewed, patient examined, no change in status, stable for surgery.  I have reviewed the patient's chart and labs.  Questions were answered to the patient's satisfaction.    Cath Lab Visit (complete for each Cath Lab visit)  Clinical Evaluation Leading to the Procedure:   ACS: Yes.    Non-ACS:    Anginal Classification: CCS III  Anti-ischemic medical therapy: Maximal Therapy (2 or more classes of medications)  Non-Invasive Test Results: No non-invasive testing performed  Prior CABG: No previous CABG   Glenetta Hew

## 2016-09-26 NOTE — Progress Notes (Signed)
Progress Note  Patient Name: [name] Date of Encounter: 09/26/2016  Primary Cardiologist: Dr. Harl Bowie  Subjective   Has diarrhea since last night. Improving. No chest pain or dyspnea.   Inpatient Medications    Scheduled Meds: . aspirin EC  81 mg Oral Daily  . atorvastatin  40 mg Oral Daily  . carvedilol  12.5 mg Oral BID  . clopidogrel  75 mg Oral Daily  . insulin aspart  0-9 Units Subcutaneous TID WC  . isosorbide mononitrate  30 mg Oral Daily  . [START ON 09/27/2016] lisinopril  20 mg Oral Daily  . pantoprazole  40 mg Oral Daily  . zolpidem  5 mg Oral QHS   Continuous Infusions: . sodium chloride 1 mL/kg/hr (09/26/16 0544)  . heparin 800 Units/hr (09/25/16 1557)   PRN Meds: acetaminophen, gi cocktail, hydrALAZINE, ibuprofen, morphine injection, ondansetron (ZOFRAN) IV, oxymetazoline, sodium chloride   Vital Signs    Vitals:   09/26/16 0224 09/26/16 0225 09/26/16 0440 09/26/16 0441  BP: (!) 118/38 (!) 116/37 (!) 123/40   Pulse: 85 87 (!) 101   Resp: 16 18 20    Temp:    98.1 F (36.7 C)  TempSrc:    Oral  SpO2: 97% 98% 97%   Weight:    147 lb 6.4 oz (66.9 kg)  Height:        Intake/Output Summary (Last 24 hours) at 09/26/16 0742 Last data filed at 09/26/16 0700  Gross per 24 hour  Intake          1684.92 ml  Output              425 ml  Net          1259.92 ml   Filed Weights   09/24/16 1855 09/25/16 0500 09/26/16 0441  Weight: 145 lb 11.6 oz (66.1 kg) 147 lb 0.8 oz (66.7 kg) 147 lb 6.4 oz (66.9 kg)    Telemetry    NSR at rate of 80s - Personally Reviewed  ECG    NSR with 1st degree AV block and non specific TWI- Personally Reviewed  Physical Exam    GEN: No acute distress.  Neck: No JVD  Cardiac: RRR, no murmurs, rubs, or gallops.  Radials/DP/PT 2+ and equal bilaterally.  No edema Respiratory:  Clear to auscultation bilaterally. GI: Soft, nontender, non-distended  MS: no deformity; no edema Neuro:  Alert and oriented x 3  Labs      Chemistry Recent Labs Lab 09/24/16 1944 09/25/16 0434 09/26/16 0410  NA 139 141 140  K 3.9 3.9 3.8  CL 108 109 111  CO2 24 23 20*  GLUCOSE 145* 116* 163*  BUN 12 15 21*  CREATININE 0.83 1.10* 1.30*  CALCIUM 9.3 9.2 8.9  GFRNONAA >60 48* 39*  GFRAA >60 56* 46*  ANIONGAP 7 9 9      Hematology Recent Labs Lab 09/23/16 1106 09/25/16 0434 09/26/16 0410  WBC 7.3 6.3 9.2  RBC 4.56 4.37 4.24  HGB 12.5 12.0 11.1*  HCT 38.2 36.8 35.7*  MCV 83.8 84.2 84.2  MCH 27.4 27.5 26.2  MCHC 32.7 32.6 31.1  RDW 14.6 14.5 15.2  PLT 184 171 169    Cardiac Enzymes Recent Labs Lab 09/23/16 1751 09/23/16 2323 09/24/16 0603 09/25/16 0438  TROPONINI 0.05* 0.05* 0.08* 0.29*   No results for input(s): TROPIPOC in the last 168 hours.   BNPNo results for input(s): BNP, PROBNP in the last 168 hours.   DDimer No results for  input(s): DDIMER in the last 168 hours.   Radiology    Ct Angio Chest Pe W Or Wo Contrast  Result Date: 09/25/2016 CLINICAL DATA:  Chest pain for 1 day, diabetes mellitus, coronary artery disease post MI and stenting, nonalcoholic fatty liver disease, cirrhosis, hypertension, diabetes mellitus, prior stroke, former smoker EXAM: CT ANGIOGRAPHY CHEST WITH CONTRAST TECHNIQUE: Multidetector CT imaging of the chest was performed using the standard protocol during bolus administration of intravenous contrast. Multiplanar CT image reconstructions and MIPs were obtained to evaluate the vascular anatomy. CONTRAST:  100 cc Isovue 370 IV COMPARISON:  07/04/2011 CTA chest, CT abdomen and pelvis 07/09/2015 FINDINGS: Cardiovascular: Atherosclerotic calcifications aorta, coronary arteries and proximal great vessels. Aorta normal caliber without aneurysm or dissection. No pericardial effusion. Pulmonary arteries adequately opacified and patent. No evidence of pulmonary embolism. Mediastinum/Nodes: Mild wall thickening of the distal thoracic esophagus unchanged. Base of cervical region  unremarkable. No thoracic adenopathy. Lungs/Pleura: Lungs clear. No pulmonary infiltrate, pleural effusion, or pneumothorax. 8 mm noncalcified pulmonary nodule at RIGHT diaphragm image 75 unchanged since 2016 and only minimally larger than seen in 2012. Calcified granuloma RIGHT upper lobe image 54. Tiny nodular density LEFT lower lobe image 81 unchanged. Upper Abdomen: Cirrhotic liver. Calcified granulomata within liver and spleen. Lab stomach unable to exclude gastric wall thickening. Perigastric varices. Musculoskeletal: No acute osseous findings. Review of the MIP images confirms the above findings. IMPRESSION: No evidence of pulmonary embolism. Cirrhotic liver with upper abdominal varices. Old granulomatous disease. Stable nodule at RIGHT diaphragm 8 mm diameter. Aortic atherosclerosis and coronary arterial calcification. Chronic wall thickening of the distal esophagus with adjacent prominent nonenhanced blood vessels unable to exclude esophageal varices. Electronically Signed   By: Lavonia Dana M.D.   On: 09/25/2016 10:59    Cardiac Studies   Echo 09/24/16 LV EF: 65% -   70%  ------------------------------------------------------------------- Indications:      Chest pain 786.51.  ------------------------------------------------------------------- History:   PMH:  elevated Troponin.  Coronary artery disease.  PMH:  GERD, OSA, PSVT, TIA.  Risk factors:  Hypertension. Diabetes mellitus. Dyslipidemia.  ------------------------------------------------------------------- Study Conclusions  - Left ventricle: The cavity size was normal. Wall thickness was   increased in a pattern of mild LVH. Systolic function was   vigorous. The estimated ejection fraction was in the range of 65%   to 70%. The study is not technically sufficient to allow   evaluation of LV diastolic function. - Aortic valve: Mildly calcified annulus. Trileaflet; mildly   thickened leaflets. There was mild stenosis. There  was moderate   regurgitation. Valve area (VTI): 1.98 cm^2. Valve area (Vmax):   1.87 cm^2. Valve area (Vmean): 1.93 cm^2. - Mitral valve: Severely calcified annulus. Mildly thickened   leaflets . - Left atrium: The atrium was severely dilated. - Atrial septum: No defect or patent foramen ovale was identified. - Technically adequate study.  Patient Profile     Ms. Current is a 76 y.o.female history of CVA, CAD with MI in 1995 where she received 2 stents to her RCA with last cath 2007 with patent coronaries, long history of palpitations with fairly benign cardiac monitors, carotid stenosis, cryptogenic cirrhosis, HL admitted to [facility] for chest pain and transferred here for cath.   Assessment & Plan    1. NSTEMI - peak of troponin 0.29. EKG without specific ischemic changes. Echo LVEF 65-70%, no WMAs, mild AS, mod AI.  - For cath later today. However now has diarrhea, now improving. Will keep NPO and move to  last case.  - Chest pain free. She was on Heparin at [facility], off here. Unknown reason. Will review with MD. Continue ASA, plavix,  statin, BB and lisinopril.  - CTA negative for PE.   2. MAT - New diagnosis this admission, long history of prior palpitations with negative cardiac monitiors. She reports no recent symptoms of palpitations until/17/18.  - No arrhthymias on telemetry overnight here. Continue beta blocker. Electrolytes normal. Heparin as above.   3. HTN - Stable with addition of lisinopril  4. AKI - Scr increased to 1.3 from 1.1 after CTA and addition of ACE. Will increase fluids to to 122m/hours.   5. Diarrhea - Per primary. As above  6. Code status - She is DNR currently. She hasn't wrote living will yet. She wants to be a full code now. Will chage.        Signed, BLeanor Kail PA  09/26/2016, 7:42 AM

## 2016-09-26 NOTE — Progress Notes (Signed)
Patient called RN due to needing to go to the bathroom.  RN assisted patient to bedside commode, patient had already started having bowel movement on the way to bedside commode.  While on the bedside commode patient became unresponsive.  Heart rate down to the 40's, respirations noted.  Blood pressure 79/51.  After a few minutes patient aroused and able to answer RN questions.  Patient assisted back to bed with max assist of two staff members.  Patient alert but drowsy, only complaining of nausea.  Patient denying feeling lightheaded and or dizzy while laying in bed.  Blood pressure rechecked while patient back in bed and was 80/41, RN checked manual pressure and was 78/38.  [facility] paged, Elmo Putt returned page and was updated, new orders entered per NP.

## 2016-09-26 NOTE — H&P (View-Only) (Signed)
Progress Note  Patient Name: [name] Date of Encounter: 09/26/2016  Primary Cardiologist: Dr. Harl Bowie  Subjective   Has diarrhea since last night. Improving. No chest pain or dyspnea.   Inpatient Medications    Scheduled Meds: . aspirin EC  81 mg Oral Daily  . atorvastatin  40 mg Oral Daily  . carvedilol  12.5 mg Oral BID  . clopidogrel  75 mg Oral Daily  . insulin aspart  0-9 Units Subcutaneous TID WC  . isosorbide mononitrate  30 mg Oral Daily  . [START ON 09/27/2016] lisinopril  20 mg Oral Daily  . pantoprazole  40 mg Oral Daily  . zolpidem  5 mg Oral QHS   Continuous Infusions: . sodium chloride 1 mL/kg/hr (09/26/16 0544)  . heparin 800 Units/hr (09/25/16 1557)   PRN Meds: acetaminophen, gi cocktail, hydrALAZINE, ibuprofen, morphine injection, ondansetron (ZOFRAN) IV, oxymetazoline, sodium chloride   Vital Signs    Vitals:   09/26/16 0224 09/26/16 0225 09/26/16 0440 09/26/16 0441  BP: (!) 118/38 (!) 116/37 (!) 123/40   Pulse: 85 87 (!) 101   Resp: 16 18 20    Temp:    98.1 F (36.7 C)  TempSrc:    Oral  SpO2: 97% 98% 97%   Weight:    147 lb 6.4 oz (66.9 kg)  Height:        Intake/Output Summary (Last 24 hours) at 09/26/16 0742 Last data filed at 09/26/16 0700  Gross per 24 hour  Intake          1684.92 ml  Output              425 ml  Net          1259.92 ml   Filed Weights   09/24/16 1855 09/25/16 0500 09/26/16 0441  Weight: 145 lb 11.6 oz (66.1 kg) 147 lb 0.8 oz (66.7 kg) 147 lb 6.4 oz (66.9 kg)    Telemetry    NSR at rate of 80s - Personally Reviewed  ECG    NSR with 1st degree AV block and non specific TWI- Personally Reviewed  Physical Exam    GEN: No acute distress.  Neck: No JVD  Cardiac: RRR, no murmurs, rubs, or gallops.  Radials/DP/PT 2+ and equal bilaterally.  No edema Respiratory:  Clear to auscultation bilaterally. GI: Soft, nontender, non-distended  MS: no deformity; no edema Neuro:  Alert and oriented x 3  Labs      Chemistry Recent Labs Lab 09/24/16 1944 09/25/16 0434 09/26/16 0410  NA 139 141 140  K 3.9 3.9 3.8  CL 108 109 111  CO2 24 23 20*  GLUCOSE 145* 116* 163*  BUN 12 15 21*  CREATININE 0.83 1.10* 1.30*  CALCIUM 9.3 9.2 8.9  GFRNONAA >60 48* 39*  GFRAA >60 56* 46*  ANIONGAP 7 9 9      Hematology Recent Labs Lab 09/23/16 1106 09/25/16 0434 09/26/16 0410  WBC 7.3 6.3 9.2  RBC 4.56 4.37 4.24  HGB 12.5 12.0 11.1*  HCT 38.2 36.8 35.7*  MCV 83.8 84.2 84.2  MCH 27.4 27.5 26.2  MCHC 32.7 32.6 31.1  RDW 14.6 14.5 15.2  PLT 184 171 169    Cardiac Enzymes Recent Labs Lab 09/23/16 1751 09/23/16 2323 09/24/16 0603 09/25/16 0438  TROPONINI 0.05* 0.05* 0.08* 0.29*   No results for input(s): TROPIPOC in the last 168 hours.   BNPNo results for input(s): BNP, PROBNP in the last 168 hours.   DDimer No results for  input(s): DDIMER in the last 168 hours.   Radiology    Ct Angio Chest Pe W Or Wo Contrast  Result Date: 09/25/2016 CLINICAL DATA:  Chest pain for 1 day, diabetes mellitus, coronary artery disease post MI and stenting, nonalcoholic fatty liver disease, cirrhosis, hypertension, diabetes mellitus, prior stroke, former smoker EXAM: CT ANGIOGRAPHY CHEST WITH CONTRAST TECHNIQUE: Multidetector CT imaging of the chest was performed using the standard protocol during bolus administration of intravenous contrast. Multiplanar CT image reconstructions and MIPs were obtained to evaluate the vascular anatomy. CONTRAST:  100 cc Isovue 370 IV COMPARISON:  07/04/2011 CTA chest, CT abdomen and pelvis 07/09/2015 FINDINGS: Cardiovascular: Atherosclerotic calcifications aorta, coronary arteries and proximal great vessels. Aorta normal caliber without aneurysm or dissection. No pericardial effusion. Pulmonary arteries adequately opacified and patent. No evidence of pulmonary embolism. Mediastinum/Nodes: Mild wall thickening of the distal thoracic esophagus unchanged. Base of cervical region  unremarkable. No thoracic adenopathy. Lungs/Pleura: Lungs clear. No pulmonary infiltrate, pleural effusion, or pneumothorax. 8 mm noncalcified pulmonary nodule at RIGHT diaphragm image 75 unchanged since 2016 and only minimally larger than seen in 2012. Calcified granuloma RIGHT upper lobe image 54. Tiny nodular density LEFT lower lobe image 81 unchanged. Upper Abdomen: Cirrhotic liver. Calcified granulomata within liver and spleen. Lab stomach unable to exclude gastric wall thickening. Perigastric varices. Musculoskeletal: No acute osseous findings. Review of the MIP images confirms the above findings. IMPRESSION: No evidence of pulmonary embolism. Cirrhotic liver with upper abdominal varices. Old granulomatous disease. Stable nodule at RIGHT diaphragm 8 mm diameter. Aortic atherosclerosis and coronary arterial calcification. Chronic wall thickening of the distal esophagus with adjacent prominent nonenhanced blood vessels unable to exclude esophageal varices. Electronically Signed   By: Lavonia Dana M.D.   On: 09/25/2016 10:59    Cardiac Studies   Echo 09/24/16 LV EF: 65% -   70%  ------------------------------------------------------------------- Indications:      Chest pain 786.51.  ------------------------------------------------------------------- History:   PMH:  elevated Troponin.  Coronary artery disease.  PMH:  GERD, OSA, PSVT, TIA.  Risk factors:  Hypertension. Diabetes mellitus. Dyslipidemia.  ------------------------------------------------------------------- Study Conclusions  - Left ventricle: The cavity size was normal. Wall thickness was   increased in a pattern of mild LVH. Systolic function was   vigorous. The estimated ejection fraction was in the range of 65%   to 70%. The study is not technically sufficient to allow   evaluation of LV diastolic function. - Aortic valve: Mildly calcified annulus. Trileaflet; mildly   thickened leaflets. There was mild stenosis. There  was moderate   regurgitation. Valve area (VTI): 1.98 cm^2. Valve area (Vmax):   1.87 cm^2. Valve area (Vmean): 1.93 cm^2. - Mitral valve: Severely calcified annulus. Mildly thickened   leaflets . - Left atrium: The atrium was severely dilated. - Atrial septum: No defect or patent foramen ovale was identified. - Technically adequate study.  Patient Profile     Ms. Wigington is a 76 y.o.female history of CVA, CAD with MI in 1995 where she received 2 stents to her RCA with last cath 2007 with patent coronaries, long history of palpitations with fairly benign cardiac monitors, carotid stenosis, cryptogenic cirrhosis, HL admitted to [facility] for chest pain and transferred here for cath.   Assessment & Plan    1. NSTEMI - peak of troponin 0.29. EKG without specific ischemic changes. Echo LVEF 65-70%, no WMAs, mild AS, mod AI.  - For cath later today. However now has diarrhea, now improving. Will keep NPO and move to  last case.  - Chest pain free. She was on Heparin at [facility], off here. Unknown reason. Will review with MD. Continue ASA, plavix,  statin, BB and lisinopril.  - CTA negative for PE.   2. MAT - New diagnosis this admission, long history of prior palpitations with negative cardiac monitiors. She reports no recent symptoms of palpitations until/17/18.  - No arrhthymias on telemetry overnight here. Continue beta blocker. Electrolytes normal. Heparin as above.   3. HTN - Stable with addition of lisinopril  4. AKI - Scr increased to 1.3 from 1.1 after CTA and addition of ACE. Will increase fluids to to 135m/hours.   5. Diarrhea - Per primary. As above  6. Code status - She is DNR currently. She hasn't wrote living will yet. She wants to be a full code now. Will chage.        Signed, BLeanor Kail PA  09/26/2016, 7:42 AM

## 2016-09-27 LAB — GLUCOSE, CAPILLARY
GLUCOSE-CAPILLARY: 127 mg/dL — AB (ref 65–99)
Glucose-Capillary: 98 mg/dL (ref 65–99)

## 2016-09-27 LAB — CBC
HEMATOCRIT: 33.1 % — AB (ref 36.0–46.0)
Hemoglobin: 10.5 g/dL — ABNORMAL LOW (ref 12.0–15.0)
MCH: 26.9 pg (ref 26.0–34.0)
MCHC: 31.7 g/dL (ref 30.0–36.0)
MCV: 84.9 fL (ref 78.0–100.0)
PLATELETS: 126 10*3/uL — AB (ref 150–400)
RBC: 3.9 MIL/uL (ref 3.87–5.11)
RDW: 15.1 % (ref 11.5–15.5)
WBC: 5.7 10*3/uL (ref 4.0–10.5)

## 2016-09-27 LAB — HEPARIN LEVEL (UNFRACTIONATED): Heparin Unfractionated: <0.1 IU/mL — ABNORMAL LOW (ref 0.30–0.70)

## 2016-09-27 MED ORDER — PANTOPRAZOLE SODIUM 40 MG PO TBEC: 40.0000 mg | DELAYED_RELEASE_TABLET | Freq: Every day | ORAL | 6 refills | Status: DC

## 2016-09-27 MED ORDER — ACETAMINOPHEN 500 MG PO TABS: 650.0000 mg | ORAL_TABLET | Freq: Four times a day (QID) | ORAL | 0 refills | Status: DC | PRN

## 2016-09-27 MED ORDER — CARVEDILOL 12.5 MG PO TABS: 12.5000 mg | ORAL_TABLET | Freq: Two times a day (BID) | ORAL | 6 refills | Status: DC

## 2016-09-27 MED ORDER — LISINOPRIL 20 MG PO TABS: 20.0000 mg | ORAL_TABLET | Freq: Every day | ORAL | 6 refills | Status: DC

## 2016-09-27 MED ORDER — ACETAMINOPHEN 500 MG PO TABS: 500.0000 mg | ORAL_TABLET | Freq: Four times a day (QID) | ORAL | 0 refills | Status: DC | PRN

## 2016-09-27 NOTE — Discharge Summary (Signed)
Discharge Summary    Patient ID: [name],  MRN: [id number], DOB/AGE: [birth year] 77 y.o.  Admit date: 09/23/2016 Discharge date: 09/27/2016  Primary Care Provider: Glo Herring Primary Cardiologist: Dr. Harl Bowie  Discharge Diagnoses    Principal Problem:   Acute coronary syndrome [facility]) Active Problems:   DM type 2 (diabetes mellitus, type 2) ([facility])   Hypercholesteremia   HTN (hypertension)   Hypokalemia   Elevated troponin   Multifocal atrial tachycardia (HCC)   Allergies Allergies  Allergen Reactions  . Tape Rash    ADHESIVE TAPE    Diagnostic Studies/Procedures   LHC: 09/26/16  Conclusion     The left ventricular systolic function is normal.  LV end diastolic pressure is normal.  The left ventricular ejection fraction is 55-65% by visual estimate.  There is mild aortic valve stenosis.  Mid RCA lesion, 25 %stenosed.  Ost Ramus lesion, 30 %stenosed.    Essentially minimal coronary artery disease withlesions. There is calcification of the mitral valve annulus and mild callus complication the proximal LAD. There does appear to be a stent in the RCA that is essentially patent.  Nothing to explain the patient's positive troponin.  Plan: Return to nursing unit for ongoing care and TR band removal Further care per primary team   ECHO 09/24/16 - Left ventricle: The cavity size was normal. Wall thickness was increased in a pattern of mild LVH. Systolic function was vigorous. The estimated ejection fraction was in the range of 65% to 70%. The study is not technically sufficient to allow evaluation of LV diastolic function. - Aortic valve: Mildly calcified annulus. Trileaflet; mildly thickened leaflets. There was mild stenosis. There was moderate regurgitation. Valve area (VTI): 1.98 cm^2. Valve area (Vmax): 1.87 cm^2. Valve area (Vmean): 1.93 cm^2. - Mitral valve: Severely calcified annulus. Mildly thickened leaflets . -  Left atrium: The atrium was severely dilated. - Atrial septum: No defect or patent foramen ovale was identified. - Technically adequate study.  _____________   History of Present Illness     [name] is a 76 y.o.female history of CVA, CAD with MI in 1995 where she received 2 stents to her RCA with last cath 2007 with patent coronaries, long history of palpitations with fairly benign cardiac monitors, carotid stenosis, cryptogenic cirrhosis, HL admitted with chest pain  She reports episode around 11pm the night prior to admission while at rest. 3-4/10 pressure in midchest. No other associated symptoms. Lasted just a few seconds, self resolved. Was able to go to sleep without troubles. The morning of admission, woke up with nausea and decided to come to ER. Denies any recent DOE or SOB.    ER vitals: bp 188/72 p 100 97% RA K 2.9, Cr 0.98, Hgb 11.6, Plt 184,  Trop 0.05-->0.05 CXR no acute process EKG SR no acute ischemic changes 04/2016 echo LVEF 55-60%, abnormal diastolic function, mild AS, moderate AI, mild MR  Hospital Course     Consultants: None  She was seen by Dr. Harl Bowie and recommended transfer to [facility] for further work up. She underwent cardiac catheterization with Dr. Ellyn Hack that was reassuring. Trop peaked at 0.29. Follow up echo showed LVEF of 65-70% with no WMAs, mild AS, mod AI.   She was continued on ASA, plavix, statin, BB and added lisinopril. She did develop MAT during this admission, but has a long hx of palpitations. No arrhthymias were noted on telemetry. Electrolytes were normal. Did have mild AKI with Scr improvement after gentle  IV fluids. Also had episodes of diarrhea, which resolved, and C-Dif was neg.   She was seen and assessed by Dr. Marlou Porch and determined stable for discharge home. I have sent a staff message to arrange for follow up in the office with Dr. Harl Bowie.  _____________  Discharge Vitals Blood pressure (!) 145/59, pulse 91, temperature 98.2 F  (36.8 C), temperature source Oral, resp. rate 15, height 5' 3"  (1.6 m), weight 150 lb 3.2 oz (68.1 kg), SpO2 99 %.  Filed Weights   09/25/16 0500 09/26/16 0441 09/27/16 0345  Weight: 147 lb 0.8 oz (66.7 kg) 147 lb 6.4 oz (66.9 kg) 150 lb 3.2 oz (68.1 kg)    Labs & Radiologic Studies    CBC  Recent Labs  09/26/16 0410 09/27/16 0322  WBC 9.2 5.7  HGB 11.1* 10.5*  HCT 35.7* 33.1*  MCV 84.2 84.9  PLT 169 093*   Basic Metabolic Panel  Recent Labs  09/24/16 1944 09/25/16 0434 09/26/16 0410  NA 139 141 140  K 3.9 3.9 3.8  CL 108 109 111  CO2 24 23 20*  GLUCOSE 145* 116* 163*  BUN 12 15 21*  CREATININE 0.83 1.10* 1.30*  CALCIUM 9.3 9.2 8.9  MG 1.8 1.8  --    Liver Function Tests No results for input(s): AST, ALT, ALKPHOS, BILITOT, PROT, ALBUMIN in the last 72 hours. No results for input(s): LIPASE, AMYLASE in the last 72 hours. Cardiac Enzymes  Recent Labs  09/25/16 0438  TROPONINI 0.29*   BNP Invalid input(s): POCBNP D-Dimer No results for input(s): DDIMER in the last 72 hours. Hemoglobin A1C No results for input(s): HGBA1C in the last 72 hours. Fasting Lipid Panel No results for input(s): CHOL, HDL, LDLCALC, TRIG, CHOLHDL, LDLDIRECT in the last 72 hours. Thyroid Function Tests No results for input(s): TSH, T4TOTAL, T3FREE, THYROIDAB in the last 72 hours.  Invalid input(s): FREET3 _____________  Dg Chest 2 View  Result Date: 09/23/2016 CLINICAL DATA:  Central chest pain since last night. Nausea this morning. EXAM: CHEST  2 VIEW COMPARISON:  03/27/2016 FINDINGS: The cardiomediastinal silhouette is within normal limits. The lungs are a mildly hyperinflated and clear. No pleural effusion or pneumothorax is identified. Thoracic spondylosis is noted. IMPRESSION: No active cardiopulmonary disease. Electronically Signed   By: Logan Bores M.D.   On: 09/23/2016 11:31   Ct Angio Chest Pe W Or Wo Contrast  Result Date: 09/25/2016 CLINICAL DATA:  Chest pain for 1 day,  diabetes mellitus, coronary artery disease post MI and stenting, nonalcoholic fatty liver disease, cirrhosis, hypertension, diabetes mellitus, prior stroke, former smoker EXAM: CT ANGIOGRAPHY CHEST WITH CONTRAST TECHNIQUE: Multidetector CT imaging of the chest was performed using the standard protocol during bolus administration of intravenous contrast. Multiplanar CT image reconstructions and MIPs were obtained to evaluate the vascular anatomy. CONTRAST:  100 cc Isovue 370 IV COMPARISON:  07/04/2011 CTA chest, CT abdomen and pelvis 07/09/2015 FINDINGS: Cardiovascular: Atherosclerotic calcifications aorta, coronary arteries and proximal great vessels. Aorta normal caliber without aneurysm or dissection. No pericardial effusion. Pulmonary arteries adequately opacified and patent. No evidence of pulmonary embolism. Mediastinum/Nodes: Mild wall thickening of the distal thoracic esophagus unchanged. Base of cervical region unremarkable. No thoracic adenopathy. Lungs/Pleura: Lungs clear. No pulmonary infiltrate, pleural effusion, or pneumothorax. 8 mm noncalcified pulmonary nodule at RIGHT diaphragm image 75 unchanged since 2016 and only minimally larger than seen in 2012. Calcified granuloma RIGHT upper lobe image 54. Tiny nodular density LEFT lower lobe image 81 unchanged. Upper Abdomen: Cirrhotic  liver. Calcified granulomata within liver and spleen. Lab stomach unable to exclude gastric wall thickening. Perigastric varices. Musculoskeletal: No acute osseous findings. Review of the MIP images confirms the above findings. IMPRESSION: No evidence of pulmonary embolism. Cirrhotic liver with upper abdominal varices. Old granulomatous disease. Stable nodule at RIGHT diaphragm 8 mm diameter. Aortic atherosclerosis and coronary arterial calcification. Chronic wall thickening of the distal esophagus with adjacent prominent nonenhanced blood vessels unable to exclude esophageal varices. Electronically Signed   By: Lavonia Dana  M.D.   On: 09/25/2016 10:59   Disposition   Pt is being discharged home today in good condition.  Follow-up Plans & Appointments    Follow-up Information    Carlyle Dolly, MD Follow up.   Specialty:  Cardiology Why:  The office will call you with a follow up appt.  Contact information: [address] [place] [address] [phone]          Discharge Instructions    Call MD for:  redness, tenderness, or signs of infection (pain, swelling, redness, odor or green/yellow discharge around incision site)    Complete by:  As directed    Diet - low sodium heart healthy    Complete by:  As directed    Discharge instructions    Complete by:  As directed    Radial Site Care Refer to this sheet in the next few weeks. These instructions provide you with information on caring for yourself after your procedure. Your caregiver may also give you more specific instructions. Your treatment has been planned according to current medical practices, but problems sometimes occur. Call your caregiver if you have any problems or questions after your procedure. HOME CARE INSTRUCTIONS You may shower the day after the procedure.Remove the bandage (dressing) and gently wash the site with plain soap and water.Gently pat the site dry.  Do not apply powder or lotion to the site.  Do not submerge the affected site in water for 3 to 5 days.  Inspect the site at least twice daily.  Do not flex or bend the affected arm for 24 hours.  No lifting over 5 pounds (2.3 kg) for 5 days after your procedure.  Do not drive home if you are discharged the same day of the procedure. Have someone else drive you.  You may drive 24 hours after the procedure unless otherwise instructed by your caregiver.  What to expect: Any bruising will usually fade within 1 to 2 weeks.  Blood that collects in the tissue (hematoma) may be painful to the touch. It should usually decrease in size and tenderness within 1 to 2 weeks.    SEEK IMMEDIATE MEDICAL CARE IF: You have unusual pain at the radial site.  You have redness, warmth, swelling, or pain at the radial site.  You have drainage (other than a small amount of blood on the dressing).  You have chills.  You have a fever or persistent symptoms for more than 72 hours.  You have a fever and your symptoms suddenly get worse.  Your arm becomes pale, cool, tingly, or numb.  You have heavy bleeding from the site. Hold pressure on the site.   We added lisinopril to your home medications. Also changed your Prilosec to Protonix due to possible interaction with plavix.   Increase activity slowly    Complete by:  As directed       Discharge Medications   Current Discharge Medication List    START taking these medications  Details  lisinopril (PRINIVIL,ZESTRIL) 20 MG tablet Take 1 tablet (20 mg total) by mouth daily. Qty: 30 tablet, Refills: 6    pantoprazole (PROTONIX) 40 MG tablet Take 1 tablet (40 mg total) by mouth daily. Qty: 30 tablet, Refills: 6      CONTINUE these medications which have CHANGED   Details  acetaminophen (TYLENOL) 500 MG tablet Take 1 tablet (500 mg total) by mouth every 6 (six) hours as needed for moderate pain. Qty: 30 tablet, Refills: 0    carvedilol (COREG) 12.5 MG tablet Take 1 tablet (12.5 mg total) by mouth 2 (two) times daily. Qty: 60 tablet, Refills: 6      CONTINUE these medications which have NOT CHANGED   Details  aspirin EC 81 MG tablet Take 81 mg by mouth daily.    atorvastatin (LIPITOR) 40 MG tablet Take 40 mg by mouth daily. Reported on 12/17/2015    clopidogrel (PLAVIX) 75 MG tablet Take 1 tablet (75 mg total) by mouth daily. Qty: 30 tablet, Refills: 2    isosorbide mononitrate (IMDUR) 30 MG 24 hr tablet Take 1 tablet by mouth daily.    ondansetron (ZOFRAN) 8 MG tablet Take 1 tablet (8 mg total) by mouth every 8 (eight) hours as needed for nausea or vomiting. Qty: 20 tablet, Refills: 2   Associated Diagnoses:  Nausea without vomiting    zolpidem (AMBIEN) 10 MG tablet Take 10 mg by mouth at bedtime.      STOP taking these medications     omeprazole (PRILOSEC) 20 MG capsule          Aspirin prescribed at discharge?  Yes High Intensity Statin Prescribed? (Lipitor 40-45m or Crestor 20-48m: Yes Beta Blocker Prescribed? Yes For EF <40%, was ACEI/ARB Prescribed? Yes ADP Receptor Inhibitor Prescribed? (i.e. Plavix etc.-Includes Medically Managed Patients): Yes For EF <40%, Aldosterone Inhibitor Prescribed? No: EF ok Was EF assessed during THIS hospitalization? Yes Was Cardiac Rehab II ordered? (Included Medically managed Patients): No   Outstanding Labs/Studies   BMET  Duration of Discharge Encounter   Greater than 30 minutes including physician time.  Signed, LiReino BellisP-C 09/27/2016, 1:45 PM   Personally seen and examined. Agree with above. Primary Cardiologist: Branch  Subjective   No chest pain, no shortness of breath, no further diarrhea  Inpatient Medications    Scheduled Meds: . aspirin EC  81 mg Oral Daily  . atorvastatin  40 mg Oral Daily  . carvedilol  12.5 mg Oral BID  . clopidogrel  75 mg Oral Daily  . heparin  5,000 Units Subcutaneous Q8H  . insulin aspart  0-9 Units Subcutaneous TID WC  . isosorbide mononitrate  30 mg Oral Daily  . lisinopril  20 mg Oral Daily  . pantoprazole  40 mg Oral Daily  . sodium chloride flush  3 mL Intravenous Q12H  . zolpidem  5 mg Oral QHS   Continuous Infusions: PRN Meds: sodium chloride, acetaminophen, gi cocktail, hydrALAZINE, ibuprofen, loperamide, morphine injection, ondansetron (ZOFRAN) IV, oxymetazoline, sodium chloride, sodium chloride flush   Vital Signs          Vitals:   09/26/16 2150 09/26/16 2255 09/26/16 2350 09/27/16 0345  BP: (!) 143/49 112/73 (!) 154/49 (!) 145/59  Pulse: (!) 106 (!) 104 (!) 103 91  Resp: 20 19 18 15   Temp:    98.2 F (36.8 C)  TempSrc:    Oral  SpO2: 97% 96% 97%  99%  Weight:    150 lb 3.2 oz (68.1 kg)  Height:        Intake/Output Summary (Last 24 hours) at 09/27/16 1209 Last data filed at 09/27/16 0900  Gross per 24 hour  Intake              840 ml  Output              450 ml  Net              390 ml        Filed Weights   09/25/16 0500 09/26/16 0441 09/27/16 0345  Weight: 147 lb 0.8 oz (66.7 kg) 147 lb 6.4 oz (66.9 kg) 150 lb 3.2 oz (68.1 kg)    Telemetry    No adverse arrhythmias - Personally Reviewed  ECG    Normal sinus rhythm - Personally Reviewed  Physical Exam   GEN:No acute distress.  Neck:No JVD Cardiac:RRR, no murmurs, rubs, or gallops.  Respiratory:Clear to auscultation bilaterally. LT:JQZE, nontender, non-distended  MS:No edema; No deformity.Catheterization site intact Neuro:AAOx3. Psych: Normal affect  Labs    Chemistry Last Labs    Recent Labs Lab 09/24/16 1944 09/25/16 0434 09/26/16 0410  NA 139 141 140  K 3.9 3.9 3.8  CL 108 109 111  CO2 24 23 20*  GLUCOSE 145* 116* 163*  BUN 12 15 21*  CREATININE 0.83 1.10* 1.30*  CALCIUM 9.3 9.2 8.9  GFRNONAA >60 48* 39*  GFRAA >60 56* 46*  ANIONGAP 7 9 9        Hematology Last Labs    Recent Labs Lab 09/25/16 0434 09/26/16 0410 09/27/16 0322  WBC 6.3 9.2 5.7  RBC 4.37 4.24 3.90  HGB 12.0 11.1* 10.5*  HCT 36.8 35.7* 33.1*  MCV 84.2 84.2 84.9  MCH 27.5 26.2 26.9  MCHC 32.6 31.1 31.7  RDW 14.5 15.2 15.1  PLT 171 169 126*      Cardiac Enzymes Last Labs    Recent Labs Lab 09/23/16 1751 09/23/16 2323 09/24/16 0603 09/25/16 0438  TROPONINI 0.05* 0.05* 0.08* 0.29*      Last Labs   No results for input(s): TROPIPOC in the last 168 hours.     BNP Last Labs   No results for input(s): BNP, PROBNP in the last 168 hours.     DDimer  Last Labs   No results for input(s): DDIMER in the last 168 hours.     Radiology    Imaging Results (Last 48 hours)  No results found.    Cardiac Studies    Cardiac catheterization 09/26/16:  The left ventricular systolic function is normal.  LV end diastolic pressure is normal.  The left ventricular ejection fraction is 55-65% by visual estimate.  There is mild aortic valve stenosis.  Mid RCA lesion, 25 %stenosed.  Ost Ramus lesion, 30 %stenosed.   Essentially minimal coronary artery disease withlesions. There is calcification of the mitral valve annulus and mild callus complication the proximal LAD. There does appear to be a stent in the RCA that is essentially patent.  Nothing to explain the patient's positive troponin.  ECHO 09/24/16 - Left ventricle: The cavity size was normal. Wall thickness was increased in a pattern of mild LVH. Systolic function was vigorous. The estimated ejection fraction was in the range of 65% to 70%. The study is not technically sufficient to allow evaluation of LV diastolic function. - Aortic valve: Mildly calcified annulus. Trileaflet; mildly thickened leaflets. There was mild stenosis. There was moderate regurgitation. Valve area (VTI): 1.98 cm^2. Valve area (Vmax): 1.87 cm^2.  Valve area (Vmean): 1.93 cm^2. - Mitral valve: Severely calcified annulus. Mildly thickened leaflets . - Left atrium: The atrium was severely dilated. - Atrial septum: No defect or patent foramen ovale was identified. - Technically adequate study.  Patient Profile     76 y.o. female history of CVA, CAD with MI in 1995 where she received 2 stents to her RCA with last cath 2007 with patent coronaries, long history of palpitations with fairly benign cardiac monitors, carotid stenosis, cryptogenic cirrhosis, HL admitted to [facility] for chest pain and transferred here for cath. Cardiac catheterization reassuring.  Assessment & Plan    NSTEMI - peak of troponin 0.29. EKG without specific ischemic changes. Echo LVEF 65-70%, no WMAs, mild AS, mod AI.  - Catheterization reassuring as above. - Continue ASA,  plavix, statin, BB and lisinopril.  - CTA negative for PE.   MAT - New diagnosis this admission, long history of prior palpitations with negative cardiac monitiors. She reports no recent symptoms of palpitations until/17/18.  - No arrhthymias on telemetry overnight here. Continue beta blocker. Electrolytes normal.    HTN - Stable with addition of lisinopril. Watch basic metabolic profile. Expect mild increase in creatinine.  AKI - Scr increased to 1.3 from 1.1 after CTA and addition of ACE. IV fluids given overnight. Repeat as outpatient in one week.  Diarrhea - C. difficile negative, hydrate. Resolved.  Code status - full code   Will have her follow-up in Dr. Nelly Laurence clinic or primary care office in 1-2 weeks.  OK for discharge  Signed, Candee Furbish, MD

## 2016-09-27 NOTE — Progress Notes (Signed)
Progress Note  Patient Name: [name] Date of Encounter: 09/27/2016  Primary Cardiologist: Harl Bowie  Subjective   No chest pain, no shortness of breath, no further diarrhea  Inpatient Medications    Scheduled Meds: . aspirin EC  81 mg Oral Daily  . atorvastatin  40 mg Oral Daily  . carvedilol  12.5 mg Oral BID  . clopidogrel  75 mg Oral Daily  . heparin  5,000 Units Subcutaneous Q8H  . insulin aspart  0-9 Units Subcutaneous TID WC  . isosorbide mononitrate  30 mg Oral Daily  . lisinopril  20 mg Oral Daily  . pantoprazole  40 mg Oral Daily  . sodium chloride flush  3 mL Intravenous Q12H  . zolpidem  5 mg Oral QHS   Continuous Infusions:  PRN Meds: sodium chloride, acetaminophen, gi cocktail, hydrALAZINE, ibuprofen, loperamide, morphine injection, ondansetron (ZOFRAN) IV, oxymetazoline, sodium chloride, sodium chloride flush   Vital Signs    Vitals:   09/26/16 2150 09/26/16 2255 09/26/16 2350 09/27/16 0345  BP: (!) 143/49 112/73 (!) 154/49 (!) 145/59  Pulse: (!) 106 (!) 104 (!) 103 91  Resp: 20 19 18 15   Temp:    98.2 F (36.8 C)  TempSrc:    Oral  SpO2: 97% 96% 97% 99%  Weight:    150 lb 3.2 oz (68.1 kg)  Height:        Intake/Output Summary (Last 24 hours) at 09/27/16 1209 Last data filed at 09/27/16 0900  Gross per 24 hour  Intake              840 ml  Output              450 ml  Net              390 ml   Filed Weights   09/25/16 0500 09/26/16 0441 09/27/16 0345  Weight: 147 lb 0.8 oz (66.7 kg) 147 lb 6.4 oz (66.9 kg) 150 lb 3.2 oz (68.1 kg)    Telemetry    No adverse arrhythmias - Personally Reviewed  ECG    Normal sinus rhythm - Personally Reviewed  Physical Exam   GEN: No acute distress.  Neck: No JVD Cardiac: RRR, no murmurs, rubs, or gallops.  Respiratory: Clear to auscultation bilaterally. GI: Soft, nontender, non-distended  MS: No edema; No deformity.Catheterization site intact Neuro:  AAOx3. Psych: Normal affect  Labs      Chemistry Recent Labs Lab 09/24/16 1944 09/25/16 0434 09/26/16 0410  NA 139 141 140  K 3.9 3.9 3.8  CL 108 109 111  CO2 24 23 20*  GLUCOSE 145* 116* 163*  BUN 12 15 21*  CREATININE 0.83 1.10* 1.30*  CALCIUM 9.3 9.2 8.9  GFRNONAA >60 48* 39*  GFRAA >60 56* 46*  ANIONGAP 7 9 9      Hematology Recent Labs Lab 09/25/16 0434 09/26/16 0410 09/27/16 0322  WBC 6.3 9.2 5.7  RBC 4.37 4.24 3.90  HGB 12.0 11.1* 10.5*  HCT 36.8 35.7* 33.1*  MCV 84.2 84.2 84.9  MCH 27.5 26.2 26.9  MCHC 32.6 31.1 31.7  RDW 14.5 15.2 15.1  PLT 171 169 126*    Cardiac Enzymes Recent Labs Lab 09/23/16 1751 09/23/16 2323 09/24/16 0603 09/25/16 0438  TROPONINI 0.05* 0.05* 0.08* 0.29*   No results for input(s): TROPIPOC in the last 168 hours.   BNPNo results for input(s): BNP, PROBNP in the last 168 hours.   DDimer No results for input(s): DDIMER in the last  168 hours.   Radiology    No results found.  Cardiac Studies   Cardiac catheterization 09/26/16:  The left ventricular systolic function is normal.  LV end diastolic pressure is normal.  The left ventricular ejection fraction is 55-65% by visual estimate.  There is mild aortic valve stenosis.  Mid RCA lesion, 25 %stenosed.  Ost Ramus lesion, 30 %stenosed.    Essentially minimal coronary artery disease withlesions. There is calcification of the mitral valve annulus and mild callus complication the proximal LAD. There does appear to be a stent in the RCA that is essentially patent.  Nothing to explain the patient's positive troponin.  ECHO 09/24/16 - Left ventricle: The cavity size was normal. Wall thickness was   increased in a pattern of mild LVH. Systolic function was   vigorous. The estimated ejection fraction was in the range of 65%   to 70%. The study is not technically sufficient to allow   evaluation of LV diastolic function. - Aortic valve: Mildly calcified annulus. Trileaflet; mildly   thickened leaflets.  There was mild stenosis. There was moderate   regurgitation. Valve area (VTI): 1.98 cm^2. Valve area (Vmax):   1.87 cm^2. Valve area (Vmean): 1.93 cm^2. - Mitral valve: Severely calcified annulus. Mildly thickened   leaflets . - Left atrium: The atrium was severely dilated. - Atrial septum: No defect or patent foramen ovale was identified. - Technically adequate study.  Patient Profile     75 y.o. female history of CVA, CAD with MI in 1995 where she received 2 stents to her RCA with last cath 2007 with patent coronaries, long history of palpitations with fairly benign cardiac monitors, carotid stenosis, cryptogenic cirrhosis, HL admitted to [facility] for chest pain and transferred here for cath. Cardiac catheterization reassuring.  Assessment & Plan    NSTEMI - peak of troponin 0.29. EKG without specific ischemic changes. Echo LVEF 65-70%, no WMAs, mild AS, mod AI.  - Catheterization reassuring as above. - Continue ASA, plavix,  statin, BB and lisinopril.  - CTA negative for PE.   MAT - New diagnosis this admission, long history of prior palpitations with negative cardiac monitiors. She reports no recent symptoms of palpitations until/17/18.  - No arrhthymias on telemetry overnight here. Continue beta blocker. Electrolytes normal.    HTN - Stable with addition of lisinopril. Watch basic metabolic profile. Expect mild increase in creatinine.  AKI - Scr increased to 1.3 from 1.1 after CTA and addition of ACE. IV fluids given overnight. Repeat as outpatient in one week.  Diarrhea - C. difficile negative, hydrate. Resolved.  Code status - full code    Will have her follow-up in Dr. Nelly Laurence clinic or primary care office in 1-2 weeks.  OK for discharge  Signed, Candee Furbish, MD  09/27/2016, 12:09 PM

## 2016-09-29 ENCOUNTER — Encounter (HOSPITAL_COMMUNITY): Payer: Self-pay | Admitting: Cardiology

## 2016-09-30 ENCOUNTER — Other Ambulatory Visit (INDEPENDENT_AMBULATORY_CARE_PROVIDER_SITE_OTHER): Payer: Self-pay | Admitting: Internal Medicine

## 2016-10-09 ENCOUNTER — Ambulatory Visit (INDEPENDENT_AMBULATORY_CARE_PROVIDER_SITE_OTHER): Payer: Medicare Other | Admitting: Adult Health

## 2016-10-09 ENCOUNTER — Encounter: Payer: Self-pay | Admitting: Adult Health

## 2016-10-09 VITALS — BP 154/74 | HR 89 | Ht 63.0 in | Wt 153.0 lb

## 2016-10-09 DIAGNOSIS — I2510 Atherosclerotic heart disease of native coronary artery without angina pectoris: Secondary | ICD-10-CM

## 2016-10-09 DIAGNOSIS — I10 Essential (primary) hypertension: Secondary | ICD-10-CM | POA: Diagnosis not present

## 2016-10-09 DIAGNOSIS — I471 Supraventricular tachycardia: Secondary | ICD-10-CM

## 2016-10-09 NOTE — Progress Notes (Signed)
Cardiology Office Note   Date:  10/09/2016   ID:  [name], DOB March 07, 1941, MRN [id number]  PCP:  Glo Herring., MD  Cardiologist: Cloria Spring, NP   No chief complaint on file.  History of Present Illness: [name] is a 76 y.o. female who presents for ongoing assessment and management of chest pain with known history of coronary artery disease. The patient had mild troponin elevation with a friend is 0.08 and 0.29 during recent hospitalization on 09/25/2016. Patient also had episodes of MAT. She was also found to be hypertensive.  During hospitalization echocardiogram was completed.  ECHO 09/24/16 - Left ventricle: The cavity size was normal. Wall thickness was increased in a pattern of mild LVH. Systolic function was vigorous. The estimated ejection fraction was in the range of 65% to 70%. The study is not technically sufficient to allow evaluation of LV diastolic function. - Aortic valve: Mildly calcified annulus. Trileaflet; mildly thickened leaflets. There was mild stenosis. There was moderate regurgitation. Valve area (VTI): 1.98 cm^2. Valve area (Vmax): 1.87 cm^2. Valve area (Vmean): 1.93 cm^2. - Mitral valve: Severely calcified annulus. Mildly thickened leaflets . - Left atrium: The atrium was severely dilated. - Atrial septum: No defect or patent foramen ovale was identified. - Technically adequate study.   Cardiac Catheterization Conclusion     The left ventricular systolic function is normal.  LV end diastolic pressure is normal.  The left ventricular ejection fraction is 55-65% by visual estimate.  There is mild aortic valve stenosis.  Mid RCA lesion, 25 %stenosed.  Ost Ramus lesion, 30 %stenosed.   The patient was continued on aspirin, Plavix, statin, beta blocker, and lisinopril and was admitted. She had no further episodes of MAT during hospitalization. She is here for post hospitalization follow-up.  She is  without cardiac complaints today. Breathing appears a little labored.   Past Medical History:  Diagnosis Date  . Anemia   . Aortic insufficiency 07/04/2011   most recent 2D Echo 02/24/2012  EF greater than 55%  . Aortic sclerosis 07/04/2011  . Back pain   . Carotid stenosis, bilateral 07/04/2011   most recent 05/2010 carotid doppler  . Cirrhosis ([place])   . Coronary artery disease   . CVA (cerebral infarction)    03/07/2014  . DDD (degenerative disc disease), lumbar   . Diabetes mellitus 2005  . GERD (gastroesophageal reflux disease)   . Hiatal hernia   . HTN (hypertension) 07/04/2011  . Hypercholesteremia   . IBS (irritable bowel syndrome)   . Myocardial infarction 1995   required 2 stents  . Non-alcoholic fatty liver disease   . OSA (obstructive sleep apnea) 07/04/2011   sleep study 2009  AHI 9.91/hr and during REM sleep 35.58/hr  . Presence of stent in right coronary artery 07/04/2011  . Stroke ([place]) 03/07/2014   no deficits from stroke  . UTI (lower urinary tract infection)    chronic/recurring/on bactrim  . Varices, esophageal ([place])     Past Surgical History:  Procedure Laterality Date  . [place]  . APPENDECTOMY    . BACK SURGERY  1985  . CARDIAC CATHETERIZATION  1995.,2006,1997   stent to the proximal RCA after MI   . CARDIAC CATHETERIZATION N/A 09/26/2016   Procedure: Left Heart Cath and Coronary Angiography;  Surgeon: Leonie Man, MD;  Location: [facility];  Service: Cardiovascular;  Laterality: N/A;  . CATARACT EXTRACTION W/PHACO Right 07/04/2014   Procedure: CATARACT EXTRACTION PHACO  AND INTRAOCULAR LENS PLACEMENT (IOC);  Surgeon: Elta Guadeloupe T. Gershon Crane, MD;  Location: [facility];  Service: Ophthalmology;  Laterality: Right;  CDE:13.13  . COLONOSCOPY    . DILATION AND CURETTAGE OF UTERUS     x2  . Epi Retinal Membrane Peel Left   . ERCP    . ESOPHAGEAL BANDING N/A 04/01/2013   Procedure: ESOPHAGEAL BANDING;  Surgeon: Rogene Houston, MD;   Location: [facility];  Service: Endoscopy;  Laterality: N/A;  . ESOPHAGEAL BANDING N/A 05/24/2013   Procedure: ESOPHAGEAL BANDING;  Surgeon: Rogene Houston, MD;  Location: [facility];  Service: Endoscopy;  Laterality: N/A;  . ESOPHAGEAL BANDING N/A 06/21/2014   Procedure: ESOPHAGEAL BANDING;  Surgeon: Rogene Houston, MD;  Location[facility];  Service: Endoscopy;  Laterality: N/A;  . ESOPHAGOGASTRODUODENOSCOPY N/A 04/01/2013   Procedure: ESOPHAGOGASTRODUODENOSCOPY (EGD);  Surgeon: Rogene Houston, MD;  [facility];  Service: Endoscopy;  Laterality: N/A;  230-rescheduled to 8:30am Ann notified pt  . ESOPHAGOGASTRODUODENOSCOPY N/A 05/24/2013   Procedure: ESOPHAGOGASTRODUODENOSCOPY (EGD);  Surgeon: Rogene Houston, MD;  Location[facility];  Service: Endoscopy;  Laterality: N/A;  730  . ESOPHAGOGASTRODUODENOSCOPY N/A 06/21/2014   Procedure: ESOPHAGOGASTRODUODENOSCOPY (EGD);  Surgeon: Rogene Houston, MD;  Location[facility];  Service: Endoscopy;  Laterality: N/A;  930-rescheduled 10/14 @ 1200 Ann to notify pt  . EYE SURGERY  08   cataract surgery of the left eye  . HARDWARE REMOVAL Right 01/17/2013   Procedure: REMOVAL OF HARDWARE AND EXCISION ULNAR STYLOID RIGHT WRIST;  Surgeon: Tennis Must, MD;  Location: [facility];  Service: Orthopedics;  Laterality: Right;  . MYRINGOTOMY  2012   both ears  . TONSILLECTOMY    . VAGINAL HYSTERECTOMY  1972  . WRIST SURGERY     rt wrist hardwear removal     Current Outpatient Prescriptions  Medication Sig Dispense Refill  . acetaminophen (TYLENOL) 500 MG tablet Take 1 tablet (500 mg total) by mouth every 6 (six) hours as needed for moderate pain. 30 tablet 0  . aspirin EC 81 MG tablet Take 81 mg by mouth daily.    Marland Kitchen atorvastatin (LIPITOR) 40 MG tablet Take 40 mg by mouth daily. Reported on 12/17/2015    . carvedilol (COREG) 12.5 MG tablet Take 1 tablet (12.5 mg total) by mouth 2 (two) times daily. 60 tablet 6   . clopidogrel (PLAVIX) 75 MG tablet Take 1 tablet (75 mg total) by mouth daily. 30 tablet 2  . isosorbide mononitrate (IMDUR) 30 MG 24 hr tablet Take 1 tablet by mouth daily.    Marland Kitchen lisinopril (PRINIVIL,ZESTRIL) 20 MG tablet Take 1 tablet (20 mg total) by mouth daily. 30 tablet 6  . omeprazole (PRILOSEC) 20 MG capsule take 1 capsule by mouth twice a day with food 60 capsule 5  . ondansetron (ZOFRAN) 8 MG tablet Take 1 tablet (8 mg total) by mouth every 8 (eight) hours as needed for nausea or vomiting. 20 tablet 2  . pantoprazole (PROTONIX) 40 MG tablet Take 1 tablet (40 mg total) by mouth daily. 30 tablet 6  . zolpidem (AMBIEN) 10 MG tablet Take 10 mg by mouth at bedtime.     No current facility-administered medications for this visit.     Allergies:   Tape    Social History:  The patient  reports that she quit smoking about 4 years ago. Her smoking use included Cigarettes. She has a 12.50 pack-year smoking history.  She has never used smokeless tobacco. She reports that she does not drink alcohol or use drugs.   Family History:  The patient's family history includes Diabetes in her mother; Heart failure in her father and maternal aunt.    ROS: All other systems are reviewed and negative. Unless otherwise mentioned in H&P    PHYSICAL EXAM: VS:  BP (!) 154/74   Pulse 89   Ht 5' 3"  (1.6 m)   Wt 153 lb (69.4 kg)   SpO2 97%   BMI 27.10 kg/m  , BMI Body mass index is 27.1 kg/m. GEN: Well nourished, well developed, in no acute distress  HEENT: normal  Neck: no JVD, carotid bruits, or masses Cardiac: RRR; 1/6 systolic murmur, rubs, or gallops,no edema  Respiratory:  clear to auscultation bilaterally, normal work of breathing GI: soft, nontender, nondistended, + BS MS: no deformity or atrophy  Skin: warm and dry, no rash Neuro:  Strength and sensation are intact Psych: euthymic mood, full affect   Recent Labs: 05/05/2016: ALT 30 09/23/2016: TSH 0.503 09/25/2016: Magnesium  1.8 09/26/2016: BUN 21; Creatinine, Ser 1.30; Potassium 3.8; Sodium 140 09/27/2016: Hemoglobin 10.5; Platelets 126    Lipid Panel    Component Value Date/Time   CHOL 127 05/06/2016 0547   CHOL 142 12/07/2013 1038   TRIG 117 05/06/2016 0547   TRIG 98 12/07/2013 1038   HDL 40 (L) 05/06/2016 0547   HDL 47 12/07/2013 1038   CHOLHDL 3.2 05/06/2016 0547   VLDL 23 05/06/2016 0547   LDLCALC 64 05/06/2016 0547   LDLCALC 75 12/07/2013 1038      Wt Readings from Last 3 Encounters:  10/09/16 153 lb (69.4 kg)  09/27/16 150 lb 3.2 oz (68.1 kg)  06/17/16 161 lb 1.6 oz (73.1 kg)     ASSESSMENT AND PLAN:  1.  Hypertension: Blood pressure is elevated today. I did recheck it with manual cuff. On reviewing her medications with her it is been found that she only takes the medicines once a day at nighttime. Carvedilol is a twice a day drug and she was not aware of this. I've advised her to take the carvedilol one in the morning and one in the evening as directed. This should help with heart rate and blood pressure control. I've advised her to take her blood pressure at home and record it and bring it back with her when I see her again. I will see her again in one month for reevaluation of blood pressure and heart rate control on medications as prescribed.  2. Coronary artery disease: Cardiac catheterization revealing non-obstructive CAD. Continued risk modification. Continue Plavix aspirin and statin.  3. Multifocal atrial tachycardia: She is tachycardic today that an EKG has not been completed. Having her take beta blocker as directed is recommended. We'll reevaluate on follow-up   Current medicines are reviewed at length with the patient today.    Labs/ tests ordered today include:  No orders of the defined types were placed in this encounter.    Disposition:   FU with 1 month. Signed, Jory Sims, NP  10/09/2016 1:47 PM    [facility] [address]  [place], [place] [address] Phone: [phone]; Fax: [phone]

## 2016-10-09 NOTE — Patient Instructions (Signed)
Your physician recommends that you schedule a follow-up appointment in: 1 Month  Your physician recommends that you continue on your current medications as directed. Please refer to the Current Medication list given to you today.  If you need a refill on your cardiac medications before your next appointment, please call your pharmacy.  Thank you for choosing [facility]!

## 2016-10-09 NOTE — Progress Notes (Signed)
Name: [name]    DOB: 1940/11/04  Age: 76 y.o.  MR#: [id number]       PCP:  Glo Herring., MD      Insurance: Payor: [employer] MEDICARE / Plan: [facility] MEDICARE / Product Type: *No Product type* /   CC:   No chief complaint on file.   VS Vitals:   10/09/16 1332  BP: (!) 154/74  Pulse: 89  SpO2: 97%  Weight: 153 lb (69.4 kg)  Height: 5' 3"  (1.6 m)    Weights Current Weight  10/09/16 153 lb (69.4 kg)  09/27/16 150 lb 3.2 oz (68.1 kg)  06/17/16 161 lb 1.6 oz (73.1 kg)    Blood Pressure  BP Readings from Last 3 Encounters:  10/09/16 (!) 154/74  09/27/16 134/61  06/17/16 (!) 142/50     Admit date:  (Not on file) Last encounter with RMR:  Visit date not found   Allergy Tape  Current Outpatient Prescriptions  Medication Sig Dispense Refill  . acetaminophen (TYLENOL) 500 MG tablet Take 1 tablet (500 mg total) by mouth every 6 (six) hours as needed for moderate pain. 30 tablet 0  . aspirin EC 81 MG tablet Take 81 mg by mouth daily.    Marland Kitchen atorvastatin (LIPITOR) 40 MG tablet Take 40 mg by mouth daily. Reported on 12/17/2015    . carvedilol (COREG) 12.5 MG tablet Take 1 tablet (12.5 mg total) by mouth 2 (two) times daily. 60 tablet 6  . clopidogrel (PLAVIX) 75 MG tablet Take 1 tablet (75 mg total) by mouth daily. 30 tablet 2  . isosorbide mononitrate (IMDUR) 30 MG 24 hr tablet Take 1 tablet by mouth daily.    Marland Kitchen lisinopril (PRINIVIL,ZESTRIL) 20 MG tablet Take 1 tablet (20 mg total) by mouth daily. 30 tablet 6  . omeprazole (PRILOSEC) 20 MG capsule take 1 capsule by mouth twice a day with food 60 capsule 5  . ondansetron (ZOFRAN) 8 MG tablet Take 1 tablet (8 mg total) by mouth every 8 (eight) hours as needed for nausea or vomiting. 20 tablet 2  . pantoprazole (PROTONIX) 40 MG tablet Take 1 tablet (40 mg total) by mouth daily. 30 tablet 6  . zolpidem (AMBIEN) 10 MG tablet Take 10 mg by mouth at bedtime.     No current facility-administered medications for this visit.      Discontinued Meds:   There are no discontinued medications.  Patient Active Problem List   Diagnosis Date Noted  . Multifocal atrial tachycardia ([place]) 09/25/2016  . Elevated troponin 09/24/2016  . Hypokalemia 09/23/2016  . TIA (transient ischemic attack) 05/05/2016  . Intractable nausea and vomiting 10/15/2015  . Acute coronary syndrome ([place]) 06/01/2015  . Bronchitis 06/01/2015  . Thrombocytopenia ([place]) 06/01/2015  . CVA (cerebral infarction) 03/06/2014  . UTI (lower urinary tract infection)   . PSVT (paroxysmal supraventricular tachycardia), recent episode this week 12/09/2013  . Esophageal varices ([place]) 03/29/2013  . Hematemesis 03/29/2013  . Hepatic cirrhosis ([place]) 03/29/2013  . Presence of stent in right coronary artery 07/04/2011  . OSA (obstructive sleep apnea) 07/04/2011  . Aortic sclerosis 07/04/2011  . Aortic insufficiency 07/04/2011  . HTN (hypertension) 07/04/2011  . Carotid stenosis, bilateral 07/04/2011  . Chest pain at rest 07/03/2011  . CAD (coronary artery disease) 07/03/2011  . DM type 2 (diabetes mellitus, type 2) ([place]) 07/03/2011  . Hypercholesteremia 07/03/2011  . GERD (gastroesophageal reflux disease) 07/03/2011    LABS    Component Value Date/Time   NA 140  09/26/2016 0410   NA 141 09/25/2016 0434   NA 139 09/24/2016 1944   K 3.8 09/26/2016 0410   K 3.9 09/25/2016 0434   K 3.9 09/24/2016 1944   CL 111 09/26/2016 0410   CL 109 09/25/2016 0434   CL 108 09/24/2016 1944   CO2 20 (L) 09/26/2016 0410   CO2 23 09/25/2016 0434   CO2 24 09/24/2016 1944   GLUCOSE 163 (H) 09/26/2016 0410   GLUCOSE 116 (H) 09/25/2016 0434   GLUCOSE 145 (H) 09/24/2016 1944   BUN 21 (H) 09/26/2016 0410   BUN 15 09/25/2016 0434   BUN 12 09/24/2016 1944   CREATININE 1.30 (H) 09/26/2016 0410   CREATININE 1.10 (H) 09/25/2016 0434   CREATININE 0.83 09/24/2016 1944   CREATININE 1.12 (H) 12/15/2013 1037   CALCIUM 8.9 09/26/2016 0410   CALCIUM 9.2 09/25/2016 0434    CALCIUM 9.3 09/24/2016 1944   GFRNONAA 39 (L) 09/26/2016 0410   GFRNONAA 48 (L) 09/25/2016 0434   GFRNONAA >60 09/24/2016 1944   GFRNONAA 50 (L) 12/15/2013 1037   GFRAA 46 (L) 09/26/2016 0410   GFRAA 56 (L) 09/25/2016 0434   GFRAA >60 09/24/2016 1944   GFRAA 57 (L) 12/15/2013 1037   CMP     Component Value Date/Time   NA 140 09/26/2016 0410   K 3.8 09/26/2016 0410   CL 111 09/26/2016 0410   CO2 20 (L) 09/26/2016 0410   GLUCOSE 163 (H) 09/26/2016 0410   BUN 21 (H) 09/26/2016 0410   CREATININE 1.30 (H) 09/26/2016 0410   CREATININE 1.12 (H) 12/15/2013 1037   CALCIUM 8.9 09/26/2016 0410   PROT 6.6 05/05/2016 0826   ALBUMIN 3.8 05/05/2016 0826   AST 40 05/05/2016 0826   ALT 30 05/05/2016 0826   ALKPHOS 108 05/05/2016 0826   BILITOT 0.8 05/05/2016 0826   GFRNONAA 39 (L) 09/26/2016 0410   GFRNONAA 50 (L) 12/15/2013 1037   GFRAA 46 (L) 09/26/2016 0410   GFRAA 57 (L) 12/15/2013 1037       Component Value Date/Time   WBC 5.7 09/27/2016 0322   WBC 9.2 09/26/2016 0410   WBC 6.3 09/25/2016 0434   HGB 10.5 (L) 09/27/2016 0322   HGB 11.1 (L) 09/26/2016 0410   HGB 12.0 09/25/2016 0434   HCT 33.1 (L) 09/27/2016 0322   HCT 35.7 (L) 09/26/2016 0410   HCT 36.8 09/25/2016 0434   MCV 84.9 09/27/2016 0322   MCV 84.2 09/26/2016 0410   MCV 84.2 09/25/2016 0434    Lipid Panel     Component Value Date/Time   CHOL 127 05/06/2016 0547   CHOL 142 12/07/2013 1038   TRIG 117 05/06/2016 0547   TRIG 98 12/07/2013 1038   HDL 40 (L) 05/06/2016 0547   HDL 47 12/07/2013 1038   CHOLHDL 3.2 05/06/2016 0547   VLDL 23 05/06/2016 0547   LDLCALC 64 05/06/2016 0547   LDLCALC 75 12/07/2013 1038    ABG    Component Value Date/Time   TCO2 25 05/05/2016 0846     Lab Results  Component Value Date   TSH 0.503 09/23/2016   BNP (last 3 results) No results for input(s): BNP in the last 8760 hours.  ProBNP (last 3 results) No results for input(s): PROBNP in the last 8760 hours.  Cardiac  Panel (last 3 results) No results for input(s): CKTOTAL, CKMB, TROPONINI, RELINDX in the last 72 hours.  Iron/TIBC/Ferritin/ %Sat No results found for: IRON, TIBC, FERRITIN, IRONPCTSAT   EKG Orders placed or performed  during the hospital encounter of 09/23/16  . EKG 12-Lead  . EKG 12-Lead  . ED EKG within 10 minutes  . ED EKG within 10 minutes  . EKG 12-Lead (at 6am)  . EKG 12-Lead (at 6am)  . EKG 12-Lead  . EKG 12-Lead  . EKG  . EKG 12-Lead  . EKG 12-Lead  . EKG 12-Lead  . EKG 12-Lead     Prior Assessment and Plan Problem List as of 10/09/2016 Reviewed: 09/26/2016  7:41 AM by Glenetta Hew, MD     Cardiovascular and Mediastinum   CAD (coronary artery disease)   Last Assessment & Plan 12/09/2013 Office Visit Written 12/09/2013  5:35 PM by Isaiah Serge, NP    Hx of RCA stents 1995, last cath 2007.  Minimal CAD at that time.  Last nuc 2012 negative for ischemia.  With her risk factors will proceed with Lexiscan myoview.      Aortic sclerosis   Last Assessment & Plan 12/09/2013 Office Visit Written 12/09/2013  5:34 PM by Isaiah Serge, NP    stable      Aortic insufficiency   HTN (hypertension)   Last Assessment & Plan 12/09/2013 Office Visit Written 12/09/2013  5:37 PM by Isaiah Serge, NP    BP elevated, I increased her coreg to 12.5 mg BID, she will call if decreased BP        Carotid stenosis, bilateral   Last Assessment & Plan 12/09/2013 Office Visit Written 12/09/2013  5:39 PM by Isaiah Serge, NP    stable      Esophageal varices ([place])   PSVT (paroxysmal supraventricular tachycardia), recent episode this week   Last Assessment & Plan 12/09/2013 Office Visit Written 12/09/2013  5:33 PM by Isaiah Serge, NP    Episode of SVT rate of 142 this week.  Has not felt well since, + weakness, + teeth aching with the episode could be anginal equivalent.  Will schedule a lexiscan myoview for next week she requested it be done in [place].  She will follow up with Dr. Debara Pickett for results,  though in the future she may request the [place] office to be seen.        Acute coronary syndrome (HCC)   TIA (transient ischemic attack)   Multifocal atrial tachycardia (HCC)     Respiratory   OSA (obstructive sleep apnea)   Bronchitis     Digestive   GERD (gastroesophageal reflux disease)   Hematemesis   Hepatic cirrhosis [facility])   Last Assessment & Plan 12/09/2013 Office Visit Written 12/09/2013  5:39 PM by Isaiah Serge, NP    stable        Endocrine   DM type 2 (diabetes mellitus, type 2) [facility])   Last Assessment & Plan 12/09/2013 Office Visit Written 12/09/2013  5:38 PM by Isaiah Serge, NP    Followed by PCP        Nervous and Auditory   CVA (cerebral infarction)     Genitourinary   UTI (lower urinary tract infection)     Other   Hypercholesteremia   Last Assessment & Plan 12/09/2013 Office Visit Written 12/09/2013  5:38 PM by Isaiah Serge, NP    Improved with recent labs.      Presence of stent in right coronary artery   Chest pain at rest   Thrombocytopenia (HCC)   Intractable nausea and vomiting   Hypokalemia   Elevated troponin       Imaging: Dg Chest  2 View  Result Date: 09/23/2016 CLINICAL DATA:  Central chest pain since last night. Nausea this morning. EXAM: CHEST  2 VIEW COMPARISON:  03/27/2016 FINDINGS: The cardiomediastinal silhouette is within normal limits. The lungs are a mildly hyperinflated and clear. No pleural effusion or pneumothorax is identified. Thoracic spondylosis is noted. IMPRESSION: No active cardiopulmonary disease. Electronically Signed   By: Logan Bores M.D.   On: 09/23/2016 11:31   Ct Angio Chest Pe W Or Wo Contrast  Result Date: 09/25/2016 CLINICAL DATA:  Chest pain for 1 day, diabetes mellitus, coronary artery disease post MI and stenting, nonalcoholic fatty liver disease, cirrhosis, hypertension, diabetes mellitus, prior stroke, former smoker EXAM: CT ANGIOGRAPHY CHEST WITH CONTRAST TECHNIQUE: Multidetector CT imaging of the  chest was performed using the standard protocol during bolus administration of intravenous contrast. Multiplanar CT image reconstructions and MIPs were obtained to evaluate the vascular anatomy. CONTRAST:  100 cc Isovue 370 IV COMPARISON:  07/04/2011 CTA chest, CT abdomen and pelvis 07/09/2015 FINDINGS: Cardiovascular: Atherosclerotic calcifications aorta, coronary arteries and proximal great vessels. Aorta normal caliber without aneurysm or dissection. No pericardial effusion. Pulmonary arteries adequately opacified and patent. No evidence of pulmonary embolism. Mediastinum/Nodes: Mild wall thickening of the distal thoracic esophagus unchanged. Base of cervical region unremarkable. No thoracic adenopathy. Lungs/Pleura: Lungs clear. No pulmonary infiltrate, pleural effusion, or pneumothorax. 8 mm noncalcified pulmonary nodule at RIGHT diaphragm image 75 unchanged since 2016 and only minimally larger than seen in 2012. Calcified granuloma RIGHT upper lobe image 54. Tiny nodular density LEFT lower lobe image 81 unchanged. Upper Abdomen: Cirrhotic liver. Calcified granulomata within liver and spleen. Lab stomach unable to exclude gastric wall thickening. Perigastric varices. Musculoskeletal: No acute osseous findings. Review of the MIP images confirms the above findings. IMPRESSION: No evidence of pulmonary embolism. Cirrhotic liver with upper abdominal varices. Old granulomatous disease. Stable nodule at RIGHT diaphragm 8 mm diameter. Aortic atherosclerosis and coronary arterial calcification. Chronic wall thickening of the distal esophagus with adjacent prominent nonenhanced blood vessels unable to exclude esophageal varices. Electronically Signed   By: Lavonia Dana M.D.   On: 09/25/2016 10:59

## 2016-10-10 ENCOUNTER — Encounter: Payer: Self-pay | Admitting: Internal Medicine

## 2016-10-15 IMAGING — US US ABDOMEN LIMITED
1 series · 14 of 25 positions shown · non-contrast
Comparison: Abdominal ultrasound October 12, 2014

CLINICAL DATA: History of hepatic cirrhosis, fatty liver disease,
hypercholesterolemia.

EXAM:
US ABDOMEN LIMITED - RIGHT UPPER QUADRANT

[Series 1: us abdomen limited · 0.18mm/px · 14 of 68 slices shown]
[im 1/68]
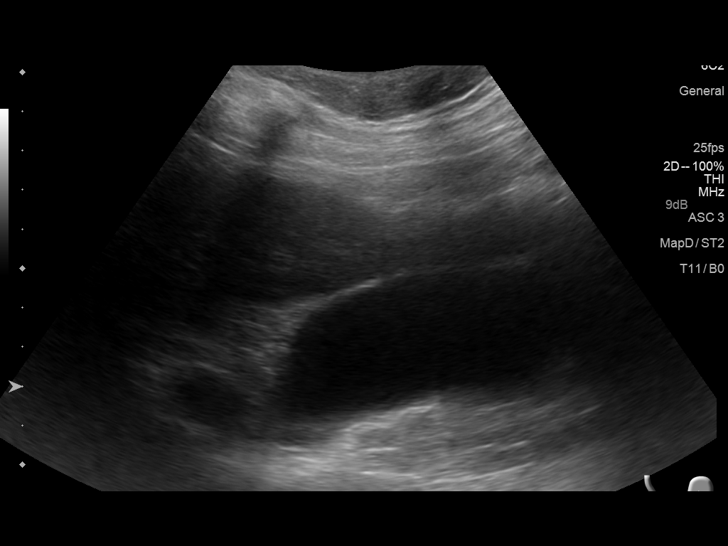
[im 6/68]
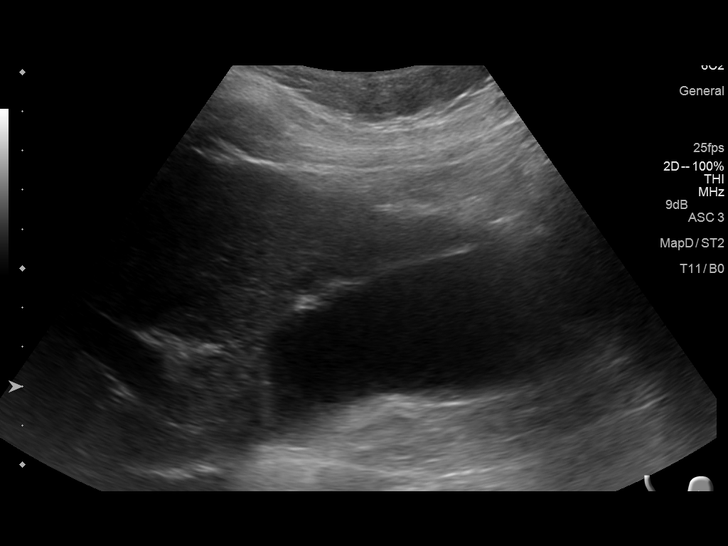
[im 12/68]
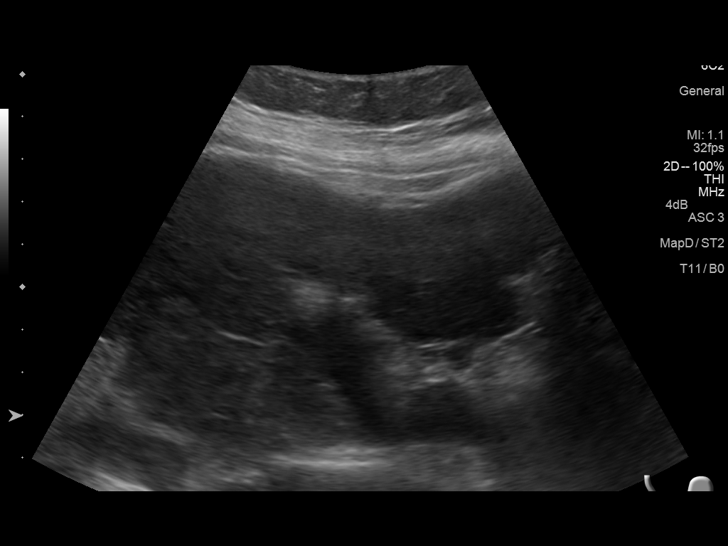
[im 17/68]
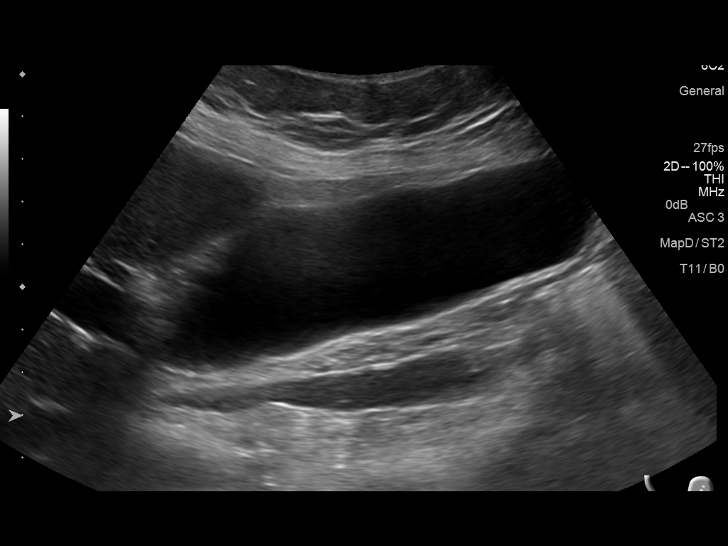
[im 23/68]
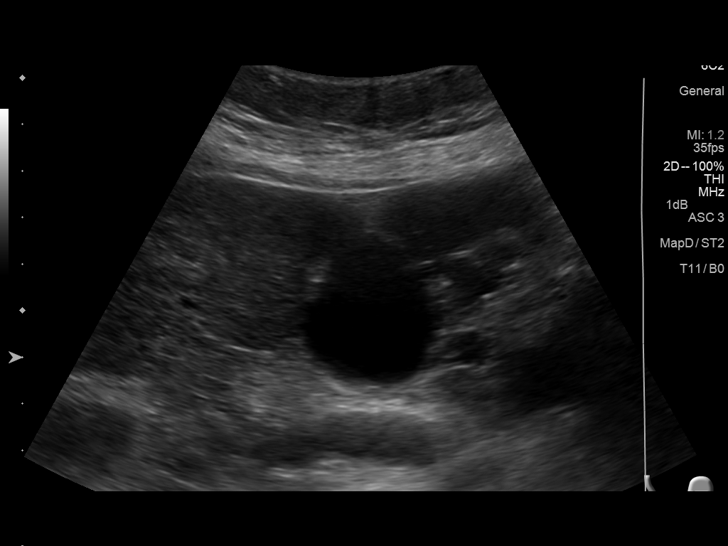
[im 26/68]
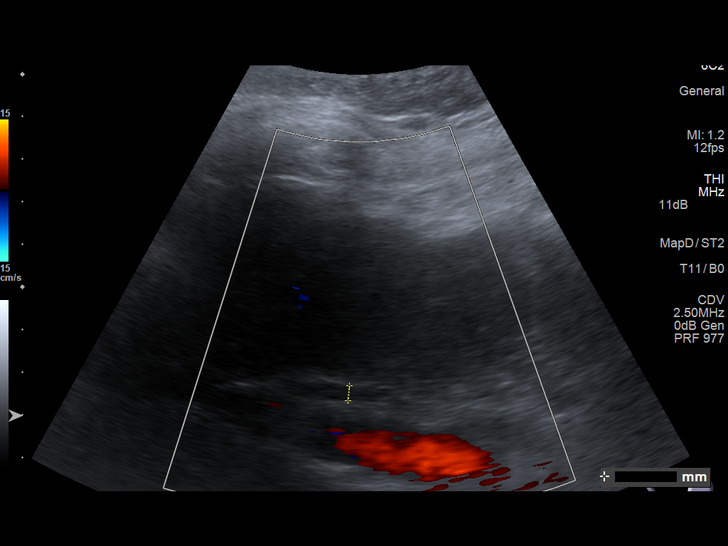
[im 31/68]
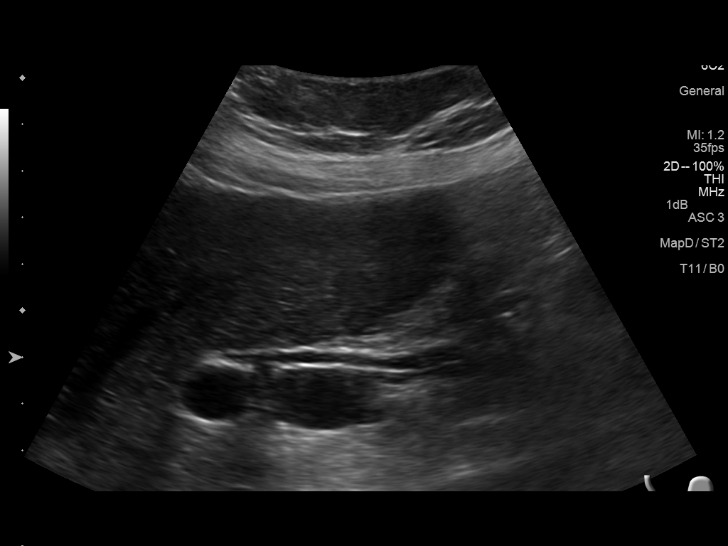
[im 37/68]
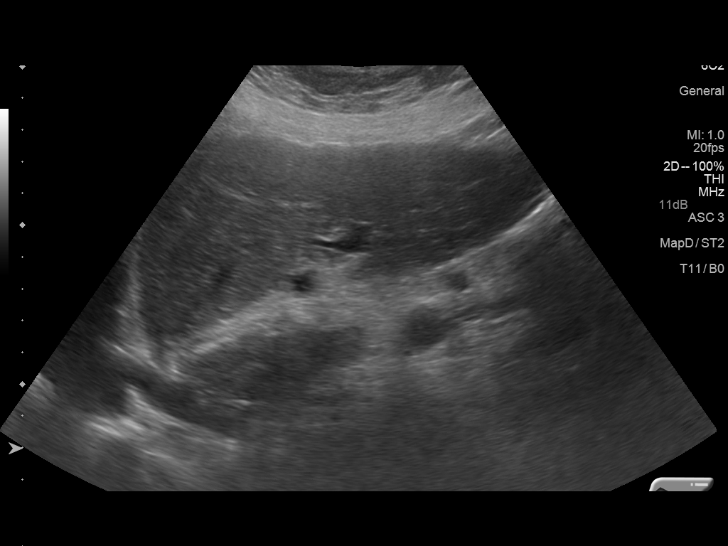
[im 42/68]
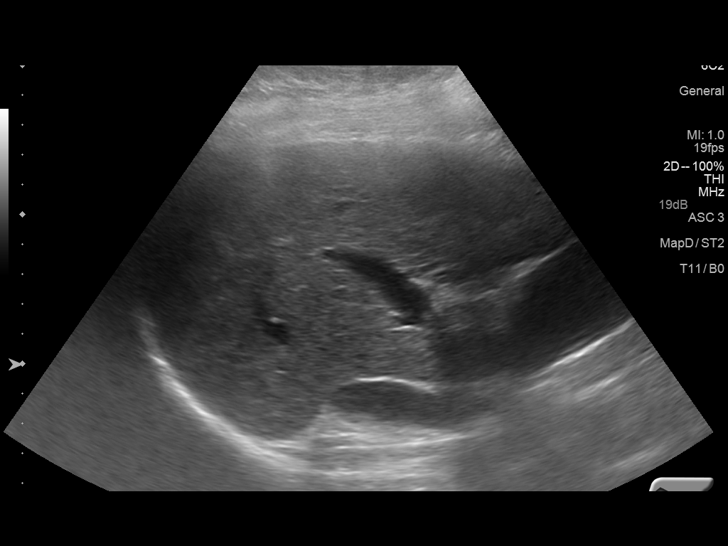
[im 45/68]
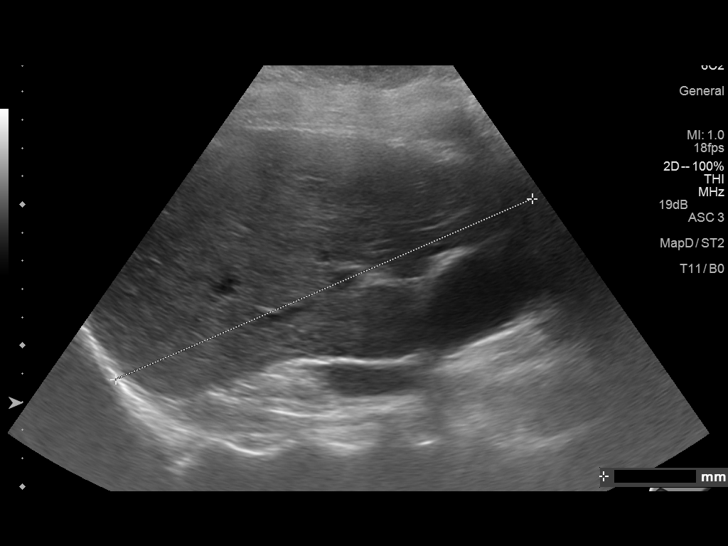
[im 51/68]
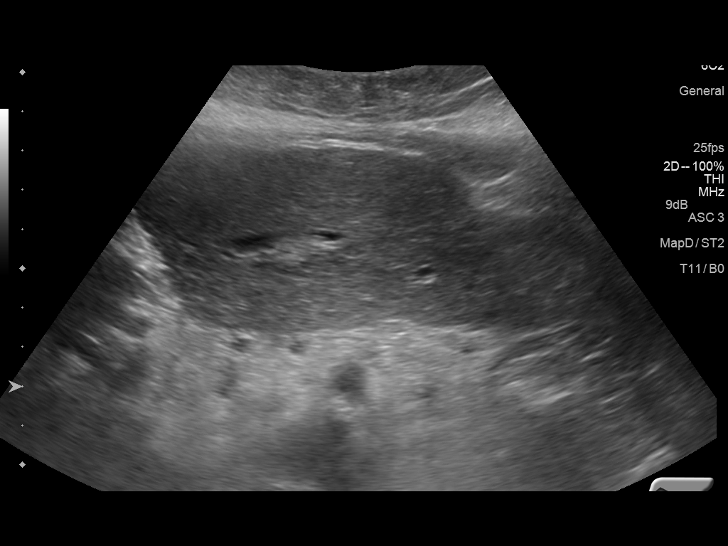
[im 56/68]
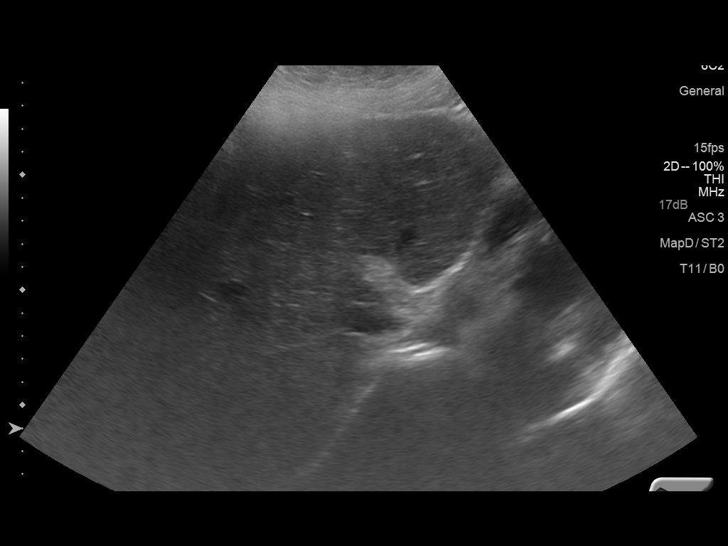
[im 62/68]
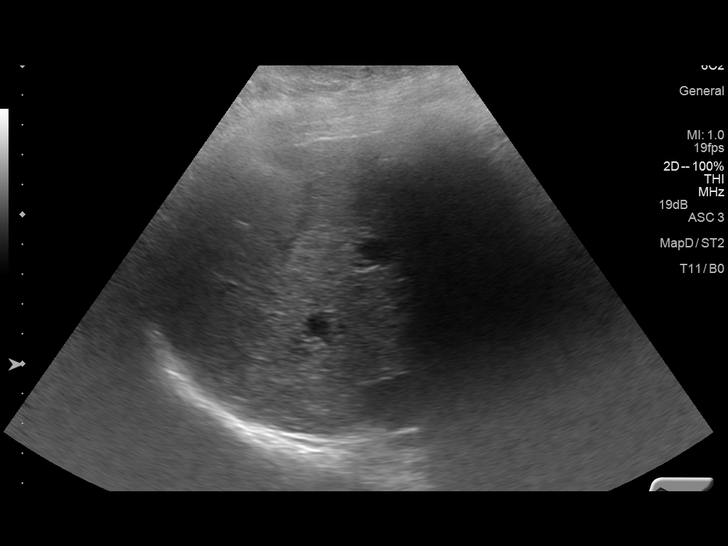
[im 68/68]
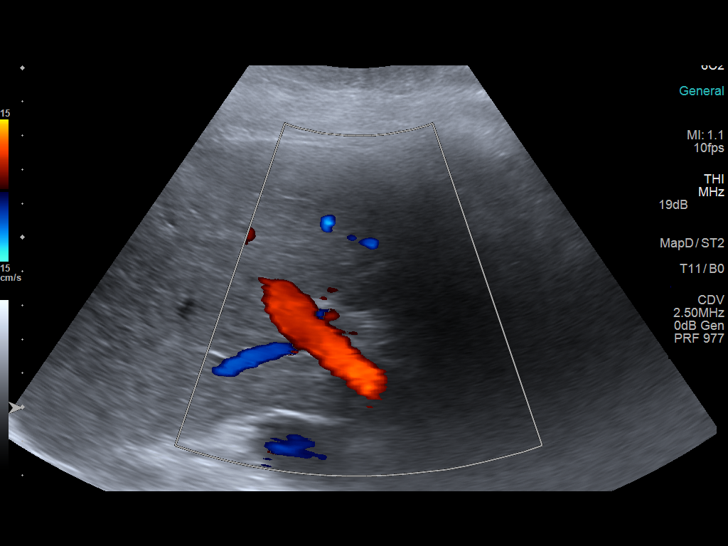

[14 of 25 positions shown; findings below may reference images not displayed]

FINDINGS: Gallbladder:

No gallstones or wall thickening visualized. No sonographic Murphy
sign noted.

Common bile duct:

Diameter: 3.5 mm

Liver:

The hepatic echotexture is heterogeneous. The surface contour is
irregular. There is no focal hepatic mass and no ductal dilation.
The echotexture is mildly increased.
IMPRESSION: 1. Stable cirrhotic changes and changes of fatty infiltration. There
is no hepatic mass or ductal dilation.
2. The gallbladder and common bile duct are unremarkable.

## 2016-10-18 IMAGING — DX DG CHEST 2V
2 series · 2 of 2 positions shown · non-contrast
Comparison: Single-view of the chest 02/07/2016. PA and lateral
chest 06/01/2015. CT chest 07/06/2011.

CLINICAL DATA: Chest pain beginning at 3 a.m. this morning.

EXAM:
CHEST  2 VIEW

[chest pa]
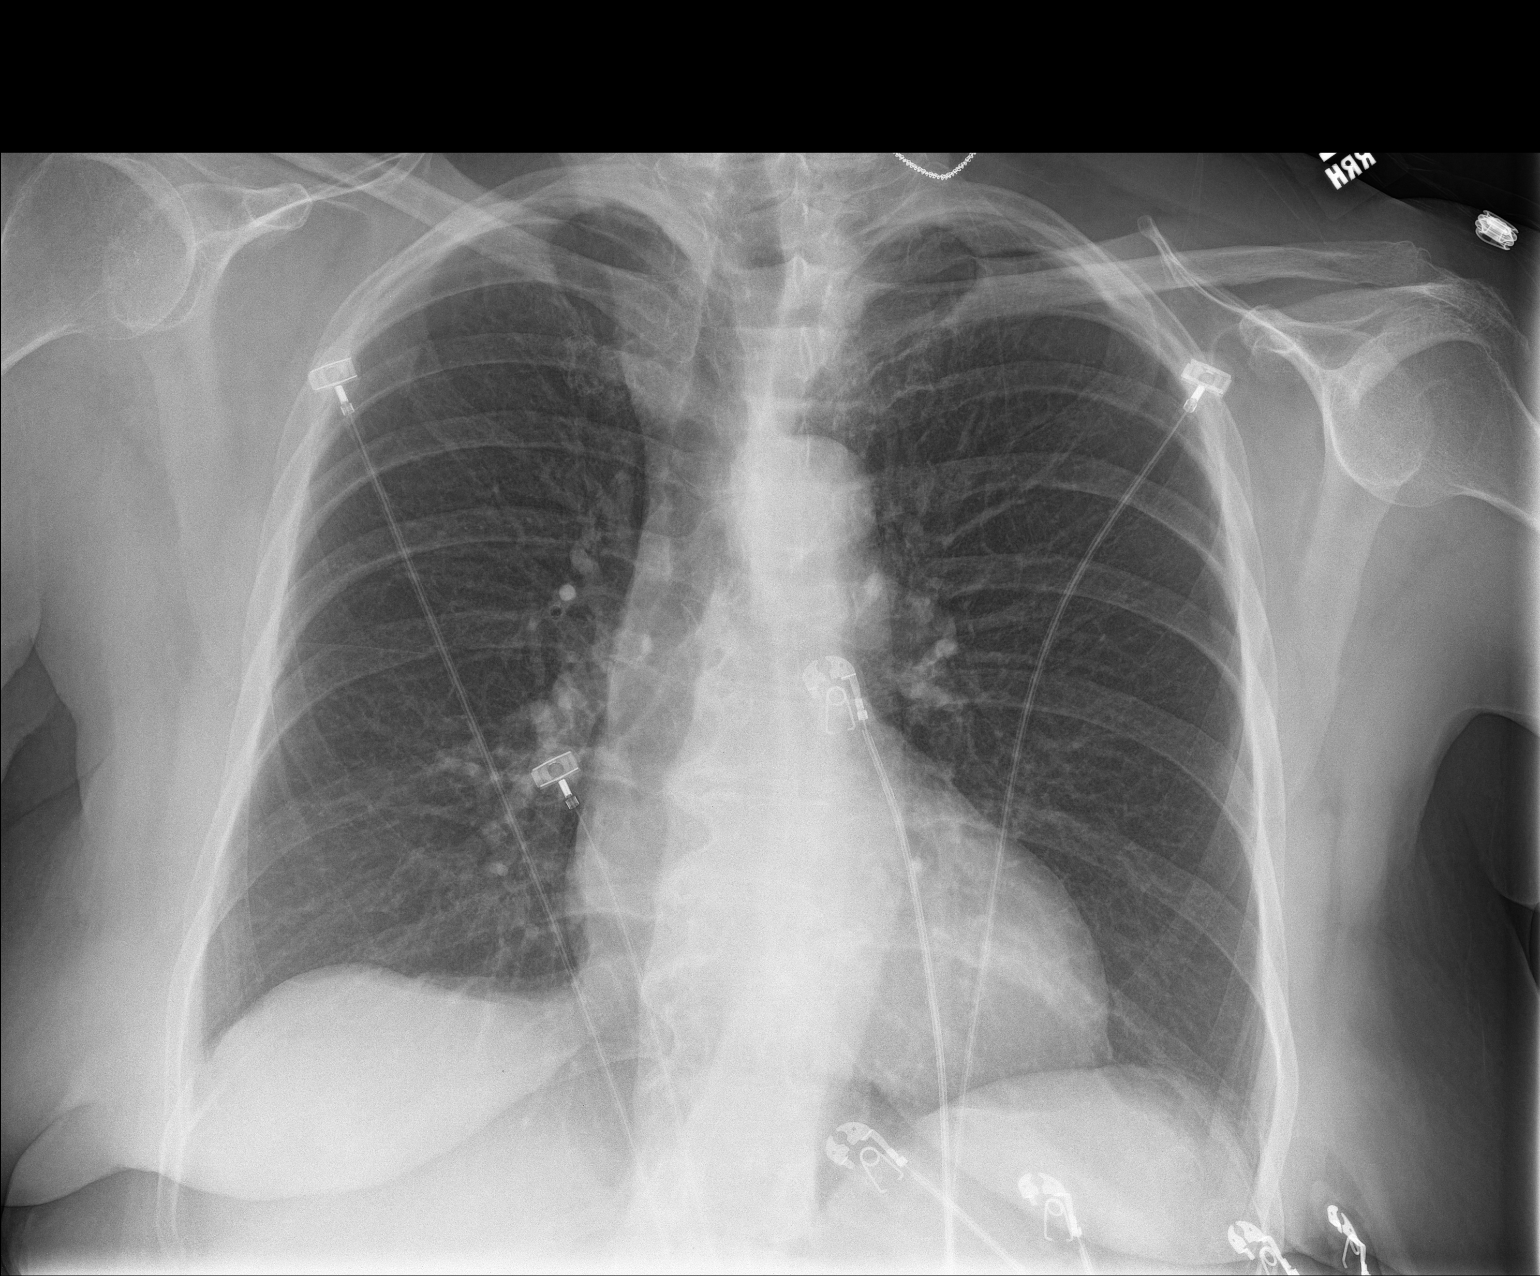

[chest lat]
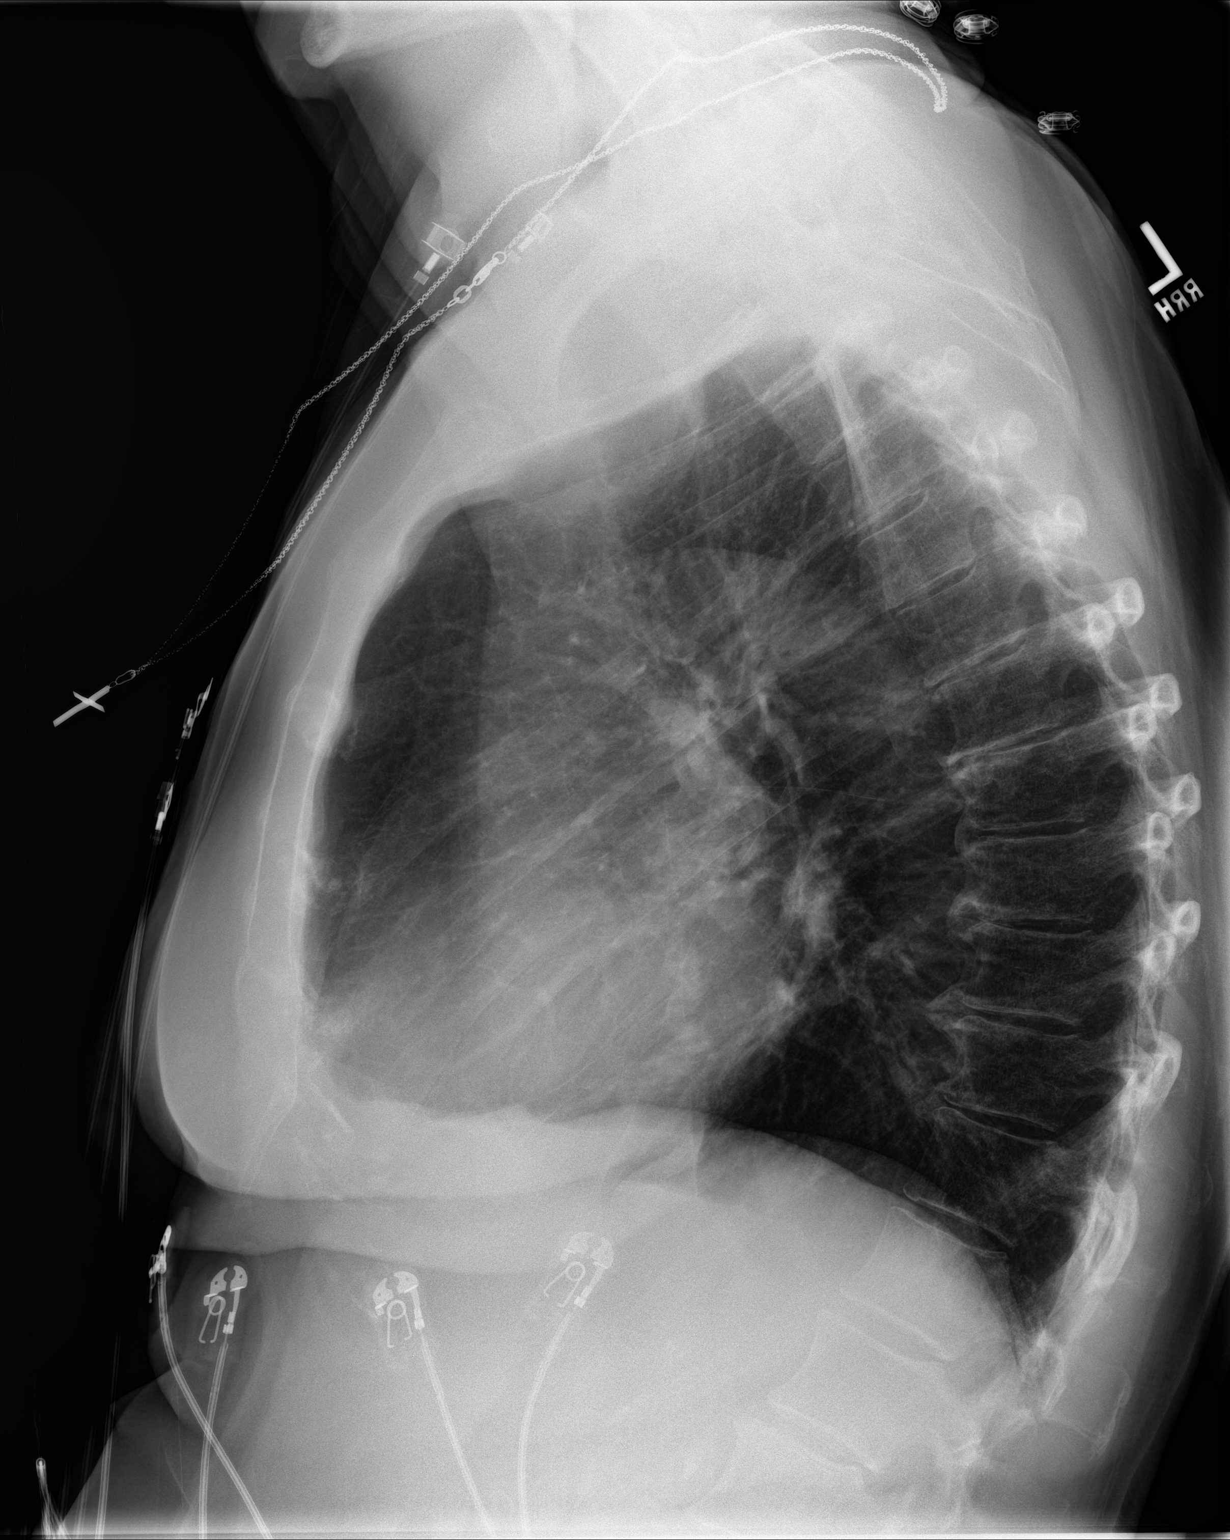

[2 of 2 positions shown; findings below may reference images not displayed]

FINDINGS: The lungs are clear. Heart size is normal. There is no pneumothorax
or pleural effusion. Mild appearing thoracic spondylosis is noted.
IMPRESSION: No acute disease.

## 2016-11-02 ENCOUNTER — Emergency Department (HOSPITAL_COMMUNITY): Payer: Medicare Other

## 2016-11-02 ENCOUNTER — Encounter (HOSPITAL_COMMUNITY): Payer: Self-pay | Admitting: Emergency Medicine

## 2016-11-02 ENCOUNTER — Inpatient Hospital Stay (HOSPITAL_COMMUNITY)
Admission: EM | Admit: 2016-11-02 | Discharge: 2016-11-04 | DRG: 281 | Disposition: A | Discharge status: Home / Self Care | Payer: Medicare Other | Attending: Internal Medicine | Admitting: Internal Medicine

## 2016-11-02 DIAGNOSIS — E7800 Pure hypercholesterolemia, unspecified: Secondary | ICD-10-CM | POA: Diagnosis present

## 2016-11-02 DIAGNOSIS — R079 Chest pain, unspecified: Secondary | ICD-10-CM | POA: Diagnosis not present

## 2016-11-02 DIAGNOSIS — E785 Hyperlipidemia, unspecified: Secondary | ICD-10-CM | POA: Diagnosis not present

## 2016-11-02 DIAGNOSIS — Z8249 Family history of ischemic heart disease and other diseases of the circulatory system: Secondary | ICD-10-CM

## 2016-11-02 DIAGNOSIS — I083 Combined rheumatic disorders of mitral, aortic and tricuspid valves: Secondary | ICD-10-CM | POA: Diagnosis present

## 2016-11-02 DIAGNOSIS — Z87440 Personal history of urinary (tract) infections: Secondary | ICD-10-CM | POA: Diagnosis not present

## 2016-11-02 DIAGNOSIS — Z9109 Other allergy status, other than to drugs and biological substances: Secondary | ICD-10-CM | POA: Diagnosis not present

## 2016-11-02 DIAGNOSIS — I471 Supraventricular tachycardia: Secondary | ICD-10-CM | POA: Diagnosis not present

## 2016-11-02 DIAGNOSIS — G4733 Obstructive sleep apnea (adult) (pediatric): Secondary | ICD-10-CM | POA: Diagnosis present

## 2016-11-02 DIAGNOSIS — K7460 Unspecified cirrhosis of liver: Secondary | ICD-10-CM | POA: Diagnosis present

## 2016-11-02 DIAGNOSIS — I10 Essential (primary) hypertension: Secondary | ICD-10-CM | POA: Diagnosis present

## 2016-11-02 DIAGNOSIS — Z7902 Long term (current) use of antithrombotics/antiplatelets: Secondary | ICD-10-CM

## 2016-11-02 DIAGNOSIS — I313 Pericardial effusion (noninflammatory): Secondary | ICD-10-CM | POA: Diagnosis not present

## 2016-11-02 DIAGNOSIS — Z8673 Personal history of transient ischemic attack (TIA), and cerebral infarction without residual deficits: Secondary | ICD-10-CM | POA: Diagnosis not present

## 2016-11-02 DIAGNOSIS — E119 Type 2 diabetes mellitus without complications: Secondary | ICD-10-CM | POA: Diagnosis present

## 2016-11-02 DIAGNOSIS — I252 Old myocardial infarction: Secondary | ICD-10-CM

## 2016-11-02 DIAGNOSIS — Z7982 Long term (current) use of aspirin: Secondary | ICD-10-CM

## 2016-11-02 DIAGNOSIS — Z90710 Acquired absence of both cervix and uterus: Secondary | ICD-10-CM | POA: Diagnosis not present

## 2016-11-02 DIAGNOSIS — Z833 Family history of diabetes mellitus: Secondary | ICD-10-CM

## 2016-11-02 DIAGNOSIS — E876 Hypokalemia: Secondary | ICD-10-CM | POA: Diagnosis present

## 2016-11-02 DIAGNOSIS — I214 Non-ST elevation (NSTEMI) myocardial infarction: Secondary | ICD-10-CM | POA: Diagnosis not present

## 2016-11-02 DIAGNOSIS — Z79899 Other long term (current) drug therapy: Secondary | ICD-10-CM

## 2016-11-02 DIAGNOSIS — R0789 Other chest pain: Secondary | ICD-10-CM | POA: Diagnosis not present

## 2016-11-02 DIAGNOSIS — Z87891 Personal history of nicotine dependence: Secondary | ICD-10-CM | POA: Diagnosis not present

## 2016-11-02 DIAGNOSIS — I25118 Atherosclerotic heart disease of native coronary artery with other forms of angina pectoris: Secondary | ICD-10-CM | POA: Diagnosis not present

## 2016-11-02 DIAGNOSIS — K219 Gastro-esophageal reflux disease without esophagitis: Secondary | ICD-10-CM | POA: Diagnosis not present

## 2016-11-02 DIAGNOSIS — I25119 Atherosclerotic heart disease of native coronary artery with unspecified angina pectoris: Secondary | ICD-10-CM | POA: Diagnosis not present

## 2016-11-02 DIAGNOSIS — I2510 Atherosclerotic heart disease of native coronary artery without angina pectoris: Secondary | ICD-10-CM | POA: Diagnosis not present

## 2016-11-02 DIAGNOSIS — K760 Fatty (change of) liver, not elsewhere classified: Secondary | ICD-10-CM | POA: Diagnosis present

## 2016-11-02 DIAGNOSIS — K589 Irritable bowel syndrome without diarrhea: Secondary | ICD-10-CM | POA: Diagnosis not present

## 2016-11-02 DIAGNOSIS — Z955 Presence of coronary angioplasty implant and graft: Secondary | ICD-10-CM | POA: Diagnosis not present

## 2016-11-02 HISTORY — DX: Urinary tract infection, site not specified: N39.0

## 2016-11-02 HISTORY — DX: Essential (primary) hypertension: I10

## 2016-11-02 HISTORY — DX: Type 2 diabetes mellitus without complications: E11.9

## 2016-11-02 HISTORY — DX: Personal history of transient ischemic attack (TIA), and cerebral infarction without residual deficits: Z86.73

## 2016-11-02 LAB — CBC
HCT: 39.1 % (ref 36.0–46.0)
Hemoglobin: 12.8 g/dL (ref 12.0–15.0)
MCH: 27.5 pg (ref 26.0–34.0)
MCHC: 32.7 g/dL (ref 30.0–36.0)
MCV: 84.1 fL (ref 78.0–100.0)
Platelets: 161 10*3/uL (ref 150–400)
RBC: 4.65 MIL/uL (ref 3.87–5.11)
RDW: 15.5 % (ref 11.5–15.5)
WBC: 5.9 10*3/uL (ref 4.0–10.5)

## 2016-11-02 LAB — I-STAT TROPONIN, ED: Troponin i, poc: 0.08 ng/mL (ref 0.00–0.08)

## 2016-11-02 LAB — BASIC METABOLIC PANEL
Anion gap: 10 (ref 5–15)
BUN: 12 mg/dL (ref 6–20)
CO2: 24 mmol/L (ref 22–32)
Calcium: 9 mg/dL (ref 8.9–10.3)
Chloride: 108 mmol/L (ref 101–111)
Creatinine, Ser: 0.84 mg/dL (ref 0.44–1.00)
GFR calc Af Amer: >60 mL/min (ref >60)
GFR calc non Af Amer: >60 mL/min (ref >60)
Glucose, Bld: 133 mg/dL — ABNORMAL HIGH (ref 65–99)
Potassium: 3.2 mmol/L — ABNORMAL LOW (ref 3.5–5.1)
Sodium: 142 mmol/L (ref 135–145)

## 2016-11-02 LAB — GLUCOSE, CAPILLARY: Glucose-Capillary: 127 mg/dL — ABNORMAL HIGH (ref 65–99)

## 2016-11-02 LAB — TROPONIN I
Troponin I: 0.37 ng/mL (ref <0.03)
Troponin I: 0.58 ng/mL (ref <0.03)

## 2016-11-02 MED ORDER — NITROGLYCERIN 0.4 MG SL SUBL: 0.4000 mg | SUBLINGUAL_TABLET | SUBLINGUAL | Status: DC | PRN

## 2016-11-02 MED ORDER — ONDANSETRON HCL 4 MG/2ML IJ SOLN: 4.0000 mg | Freq: Four times a day (QID) | INTRAMUSCULAR | Status: DC | PRN

## 2016-11-02 MED ORDER — LABETALOL HCL 5 MG/ML IV SOLN
20.0000 mg | Freq: Once | INTRAVENOUS | Status: AC
  Administered 2016-11-02: 20 mg via INTRAVENOUS
  Filled 2016-11-02: qty 4

## 2016-11-02 MED ORDER — ACETAMINOPHEN 500 MG PO TABS
500.0000 mg | ORAL_TABLET | Freq: Four times a day (QID) | ORAL | Status: DC | PRN
  Administered 2016-11-03 (×2): 500 mg via ORAL
  Filled 2016-11-02 (×2): qty 1

## 2016-11-02 MED ORDER — CARVEDILOL 12.5 MG PO TABS
12.5000 mg | ORAL_TABLET | Freq: Two times a day (BID) | ORAL | Status: DC
  Administered 2016-11-03: 12.5 mg via ORAL
  Filled 2016-11-02: qty 1

## 2016-11-02 MED ORDER — POTASSIUM CHLORIDE CRYS ER 20 MEQ PO TBCR
40.0000 meq | EXTENDED_RELEASE_TABLET | Freq: Once | ORAL | Status: AC
  Administered 2016-11-02: 40 meq via ORAL
  Filled 2016-11-02: qty 2

## 2016-11-02 MED ORDER — ENOXAPARIN SODIUM 80 MG/0.8ML ~~LOC~~ SOLN
1.0000 mg/kg | Freq: Two times a day (BID) | SUBCUTANEOUS | Status: DC
  Administered 2016-11-03 – 2016-11-04 (×3): 65 mg via SUBCUTANEOUS
  Filled 2016-11-02 (×3): qty 0.8

## 2016-11-02 MED ORDER — PANTOPRAZOLE SODIUM 40 MG PO TBEC
40.0000 mg | DELAYED_RELEASE_TABLET | Freq: Every day | ORAL | Status: DC
  Administered 2016-11-03 – 2016-11-04 (×2): 40 mg via ORAL
  Filled 2016-11-02 (×2): qty 1

## 2016-11-02 MED ORDER — ONDANSETRON HCL 4 MG PO TABS
8.0000 mg | ORAL_TABLET | Freq: Three times a day (TID) | ORAL | Status: DC | PRN
  Administered 2016-11-03 – 2016-11-04 (×2): 8 mg via ORAL
  Filled 2016-11-02 (×2): qty 2

## 2016-11-02 MED ORDER — LISINOPRIL 10 MG PO TABS: 20.0000 mg | ORAL_TABLET | Freq: Every day | ORAL | Status: DC

## 2016-11-02 MED ORDER — ATORVASTATIN CALCIUM 40 MG PO TABS
40.0000 mg | ORAL_TABLET | Freq: Every day | ORAL | Status: DC
  Administered 2016-11-03 – 2016-11-04 (×2): 40 mg via ORAL
  Filled 2016-11-02 (×2): qty 1

## 2016-11-02 MED ORDER — ASPIRIN EC 81 MG PO TBEC
81.0000 mg | DELAYED_RELEASE_TABLET | Freq: Every day | ORAL | Status: DC
  Administered 2016-11-03 – 2016-11-04 (×2): 81 mg via ORAL
  Filled 2016-11-02 (×2): qty 1

## 2016-11-02 MED ORDER — ONDANSETRON HCL 4 MG/2ML IJ SOLN
4.0000 mg | Freq: Once | INTRAMUSCULAR | Status: AC
  Administered 2016-11-02: 4 mg via INTRAVENOUS
  Filled 2016-11-02: qty 2

## 2016-11-02 MED ORDER — ZOLPIDEM TARTRATE 5 MG PO TABS
5.0000 mg | ORAL_TABLET | Freq: Every day | ORAL | Status: DC
  Administered 2016-11-03 – 2016-11-04 (×2): 5 mg via ORAL
  Filled 2016-11-02 (×2): qty 1

## 2016-11-02 MED ORDER — ASPIRIN 81 MG PO CHEW
243.0000 mg | CHEWABLE_TABLET | Freq: Once | ORAL | Status: AC
  Administered 2016-11-02: 243 mg via ORAL
  Filled 2016-11-02: qty 3

## 2016-11-02 MED ORDER — CLOPIDOGREL BISULFATE 75 MG PO TABS
75.0000 mg | ORAL_TABLET | Freq: Every day | ORAL | Status: DC
  Administered 2016-11-03 – 2016-11-04 (×2): 75 mg via ORAL
  Filled 2016-11-02 (×2): qty 1

## 2016-11-02 MED ORDER — TRAMADOL HCL 50 MG PO TABS
50.0000 mg | ORAL_TABLET | Freq: Two times a day (BID) | ORAL | Status: DC | PRN
  Administered 2016-11-03 (×2): 50 mg via ORAL
  Filled 2016-11-02 (×2): qty 1

## 2016-11-02 MED ORDER — ISOSORBIDE MONONITRATE ER 60 MG PO TB24
30.0000 mg | ORAL_TABLET | Freq: Every day | ORAL | Status: DC
  Administered 2016-11-03 – 2016-11-04 (×2): 30 mg via ORAL
  Filled 2016-11-02 (×2): qty 1

## 2016-11-02 NOTE — Progress Notes (Signed)
ANTICOAGULATION CONSULT NOTE - Preliminary  Pharmacy Consult for Enoxaparin Indication: ACS/Nstemi/CP  Allergies  Allergen Reactions  . Tape Rash    ADHESIVE TAPE    Patient Measurements: Height: 5' 3"  (160 cm) Weight: 144 lb 2.9 oz (65.4 kg) IBW/kg (Calculated) : 52.4 HEPARIN DW (KG): 65.4   Vital Signs: Temp: 98.7 F (37.1 C) (02/25 2227) Temp Source: Oral (02/25 2227) BP: 189/64 (02/25 2227) Pulse Rate: 101 (02/25 2227)  Labs:  Recent Labs  11/02/16 1131 11/02/16 1351 11/02/16 1648  HGB 12.8  --   --   HCT 39.1  --   --   PLT 161  --   --   CREATININE 0.84  --   --   TROPONINI  --  0.37* 0.58*   Estimated Creatinine Clearance: 53.4 mL/min (by C-G formula based on SCr of 0.84 mg/dL).  Medical History: Past Medical History:  Diagnosis Date  . Anemia   . Aortic insufficiency 07/04/2011   most recent 2D Echo 02/24/2012  EF greater than 55%  . Aortic sclerosis 07/04/2011  . Back pain   . Carotid stenosis, bilateral 07/04/2011   most recent 05/2010 carotid doppler  . Cirrhosis ([place])   . Coronary artery disease   . CVA (cerebral infarction)    03/07/2014  . DDD (degenerative disc disease), lumbar   . Diabetes mellitus 2005  . GERD (gastroesophageal reflux disease)   . Hiatal hernia   . HTN (hypertension) 07/04/2011  . Hypercholesteremia   . IBS (irritable bowel syndrome)   . Myocardial infarction 1995   required 2 stents  . Non-alcoholic fatty liver disease   . OSA (obstructive sleep apnea) 07/04/2011   sleep study 2009  AHI 9.91/hr and during REM sleep 35.58/hr  . Presence of stent in right coronary artery 07/04/2011  . Stroke ([place]) 03/07/2014   no deficits from stroke  . UTI (lower urinary tract infection)    chronic/recurring/on bactrim  . Varices, esophageal ([facility])     Medications:   Assessment: 76 yo female with hx of CVA, CAD and MI s/p stents x 2 to RCA, seen in the ED for CP. Last hospital admission 09/27/16 for similar complaints. Troponin  elevated and trending upwards. MD requests enoxaparin per Pharmacy. Review of MD admission note: Pt to receive one dose of lovenox pending Cardiology consult in am. Will order per our usual treatment guidelines of 1 mg/kg lovenox every 12 hours with change in therapy or continuation at that time.  Goal of Therapy:  Heparin level goal 0.6-1 unit/ml Monitor platelets by anticoagulation protocol: Yes   Plan:  Lovenox 65 mg Sub Q every 12 hours (56m/kg)  Preliminary review of pertinent patient information completed.  AForestine Naclinical pharmacist will complete review during morning rounds to assess the patient and finalize treatment regimen.  MNorberto Sorenson [facility]2/25/2018,11:47 PM

## 2016-11-02 NOTE — ED Provider Notes (Signed)
[facility] DEPT Provider Note   CSN: [id number] Arrival date & time: 11/02/16  1108   By signing my name below, I, Hilbert Odor, attest that this documentation has been prepared under the direction and in the presence of Virgel Manifold, MD. Electronically Signed: Hilbert Odor, Scribe. 11/02/16. 12:20 PM. History   Chief Complaint Chief Complaint  Patient presents with  . Chest Pain    N?V last night CP this am      The history is provided by the patient. No language interpreter was used.  HPI Comments: [name] is a 76 y.o. female who presents to the Emergency Department complaining of centralized, sharp chest pain that began around 7:30 am this morning. She states that the pain lasted for about 30 minutes before resolving. She states that she took a baby ASA with no significant relief. She states that last night she felt nauseated but no pain was noted at that time. She reports noticing a fast hear rate. She denies SOB, abdominal pain, diarrhea, and leg swelling. The patient is not currently on NTG. The patient states that she was in the hospital a few weeks ago but states that the pain at that time was more sharp than this current episode.  Past Medical History:  Diagnosis Date  . Anemia   . Aortic insufficiency 07/04/2011   most recent 2D Echo 02/24/2012  EF greater than 55%  . Aortic sclerosis 07/04/2011  . Back pain   . Carotid stenosis, bilateral 07/04/2011   most recent 05/2010 carotid doppler  . Cirrhosis ([place])   . Coronary artery disease   . CVA (cerebral infarction)    03/07/2014  . DDD (degenerative disc disease), lumbar   . Diabetes mellitus 2005  . GERD (gastroesophageal reflux disease)   . Hiatal hernia   . HTN (hypertension) 07/04/2011  . Hypercholesteremia   . IBS (irritable bowel syndrome)   . Myocardial infarction 1995   required 2 stents  . Non-alcoholic fatty liver disease   . OSA (obstructive sleep apnea) 07/04/2011   sleep study 2009   AHI 9.91/hr and during REM sleep 35.58/hr  . Presence of stent in right coronary artery 07/04/2011  . Stroke ([place]) 03/07/2014   no deficits from stroke  . UTI (lower urinary tract infection)    chronic/recurring/on bactrim  . Varices, esophageal [facility])     Patient Active Problem List   Diagnosis Date Noted  . Multifocal atrial tachycardia ([place]) 09/25/2016  . Elevated troponin 09/24/2016  . Hypokalemia 09/23/2016  . TIA (transient ischemic attack) 05/05/2016  . Intractable nausea and vomiting 10/15/2015  . Acute coronary syndrome ([place]) 06/01/2015  . Bronchitis 06/01/2015  . Thrombocytopenia ([place]) 06/01/2015  . CVA (cerebral infarction) 03/06/2014  . UTI (lower urinary tract infection)   . PSVT (paroxysmal supraventricular tachycardia), recent episode this week 12/09/2013  . Esophageal varices ([place]) 03/29/2013  . Hematemesis 03/29/2013  . Hepatic cirrhosis ([place]) 03/29/2013  . Presence of stent in right coronary artery 07/04/2011  . OSA (obstructive sleep apnea) 07/04/2011  . Aortic sclerosis 07/04/2011  . Aortic insufficiency 07/04/2011  . HTN (hypertension) 07/04/2011  . Carotid stenosis, bilateral 07/04/2011  . Chest pain at rest 07/03/2011  . CAD (coronary artery disease) 07/03/2011  . DM type 2 (diabetes mellitus, type 2) ([place]) 07/03/2011  . Hypercholesteremia 07/03/2011  . GERD (gastroesophageal reflux disease) 07/03/2011    Past Surgical History:  Procedure Laterality Date  . [place]  . APPENDECTOMY    . BACK SURGERY  [place]  1995.,2006,1997   stent to the proximal RCA after MI   . CARDIAC CATHETERIZATION N/A 09/26/2016   Procedure: Left Heart Cath and Coronary Angiography;  Surgeon: Leonie Man, MD;  Location: [facility] CV LAB;  Service: Cardiovascular;  Laterality: N/A;  . CATARACT EXTRACTION W/PHACO Right 07/04/2014   Procedure: CATARACT EXTRACTION PHACO AND INTRAOCULAR LENS PLACEMENT (IOC);  Surgeon: Elta Guadeloupe T.  Gershon Crane, MD;  Location: [facility] OR[facility];  Service: Ophthalmology;  Laterality: Right;  CDE:13.13  . COLONOSCOPY    . DILATION AND CURETTAGE OF UTERUS     x2  . Epi Retinal Membrane Peel Left   . ERCP    . ESOPHAGEAL BANDING N/A 04/01/2013   Procedure: ESOPHAGEAL BANDING;  Surgeon: Rogene Houston, MD;  Location: [facility];  Service: Endoscopy;  Laterality: N/A;  . ESOPHAGEAL BANDING N/A 05/24/2013   Procedure: ESOPHAGEAL BANDING;  Surgeon: Rogene Houston, MD;  Location[facility];  Service: Endoscopy;  Laterality: N/A;  . ESOPHAGEAL BANDING N/A 06/21/2014   Procedure: ESOPHAGEAL BANDING;  Surgeon: Rogene Houston, MD;  [facility];  Service: Endoscopy;  Laterality: N/A;  . ESOPHAGOGASTRODUODENOSCOPY N/A 04/01/2013   Procedure: ESOPHAGOGASTRODUODENOSCOPY (EGD);  Surgeon: Rogene Houston, MD;  [facility];  Service: Endoscopy;  Laterality: N/A;  230-rescheduled to 8:30am Ann notified pt  . ESOPHAGOGASTRODUODENOSCOPY N/A 05/24/2013   Procedure: ESOPHAGOGASTRODUODENOSCOPY (EGD);  Surgeon: Rogene Houston, MD;  [facility];  Service: Endoscopy;  Laterality: N/A;  730  . ESOPHAGOGASTRODUODENOSCOPY N/A 06/21/2014   Procedure: ESOPHAGOGASTRODUODENOSCOPY (EGD);  Surgeon: Rogene Houston, MD;  [facility];  Service: Endoscopy;  Laterality: N/A;  930-rescheduled 10/14 @ 1200 Ann to notify pt  . EYE SURGERY  08   cataract surgery of the left eye  . HARDWARE REMOVAL Right 01/17/2013   Procedure: REMOVAL OF HARDWARE AND EXCISION ULNAR STYLOID RIGHT WRIST;  Surgeon: Tennis Must, MD;  Location: [facility];  Service: Orthopedics;  Laterality: Right;  . MYRINGOTOMY  2012   both ears  . TONSILLECTOMY    . VAGINAL HYSTERECTOMY  1972  . WRIST SURGERY     rt wrist hardwear removal    OB History    No data available       Home Medications    Prior to Admission medications   Medication Sig Start Date End Date Taking? Authorizing Provider    acetaminophen (TYLENOL) 500 MG tablet Take 1 tablet (500 mg total) by mouth every 6 (six) hours as needed for moderate pain. Patient taking differently: Take 1,000 mg by mouth every 6 (six) hours as needed for moderate pain.  09/27/16  Yes Cheryln Manly, NP  aspirin EC 81 MG tablet Take 81 mg by mouth daily.   Yes Historical Provider, MD  atorvastatin (LIPITOR) 40 MG tablet Take 40 mg by mouth at bedtime. Reported on 12/17/2015   Yes Historical Provider, MD  carvedilol (COREG) 12.5 MG tablet Take 1 tablet (12.5 mg total) by mouth 2 (two) times daily. 09/27/16  Yes Cheryln Manly, NP  clopidogrel (PLAVIX) 75 MG tablet Take 1 tablet (75 mg total) by mouth daily. 05/06/16  Yes Erline Hau, MD  isosorbide mononitrate (IMDUR) 30 MG 24 hr tablet Take 1 tablet by mouth daily. 04/20/16  Yes Historical Provider, MD  lisinopril (PRINIVIL,ZESTRIL) 20 MG tablet Take 1 tablet (20 mg total) by mouth daily. 09/28/16  Yes Cheryln Manly, NP  omeprazole (PRILOSEC) 20 MG capsule take 1 capsule by mouth twice a day with food 09/30/16  Yes Rogene Houston, MD  ondansetron (ZOFRAN) 8 MG tablet Take 1 tablet (8 mg total) by mouth every 8 (eight) hours as needed for nausea or vomiting. 09/11/16  Yes Butch Penny, NP  zolpidem (AMBIEN) 10 MG tablet Take 10 mg by mouth at bedtime.   Yes Historical Provider, MD    Family History Family History  Problem Relation Age of Onset  . Diabetes Mother   . Heart failure Father   . Heart failure Maternal Aunt     Social History Social History  Substance Use Topics  . Smoking status: Former Smoker    Packs/day: 0.25    Years: 50.00    Types: Cigarettes    Quit date: 01/13/2012  . Smokeless tobacco: Never Used  . Alcohol use No     Allergies   Tape   Review of Systems Review of Systems  A complete 10 system review of systems was obtained and all systems are negative except as noted in the HPI and PMH.    Physical Exam Updated Vital Signs BP  179/71 (BP Location: Right Arm)   Pulse 112   Temp 98.7 F (37.1 C) (Oral)   Resp 23   Ht 5' 3"  (1.6 m)   Wt 180 lb (81.6 kg)   SpO2 97%   BMI 31.89 kg/m   Physical Exam  Constitutional: She is oriented to person, place, and time. She appears well-developed and well-nourished. No distress.  HENT:  Head: Normocephalic and atraumatic.  Eyes: EOM are normal.  Neck: Normal range of motion.  Cardiovascular:  Murmur heard. Mild tachycardia. Systolic murmur.  Pulmonary/Chest: Effort normal and breath sounds normal.  Abdominal: Soft. She exhibits no distension. There is no tenderness.  Musculoskeletal: Normal range of motion. She exhibits edema.  Minimal symmetric lower extremity edema  Neurological: She is alert and oriented to person, place, and time.  Skin: Skin is warm and dry.  Psychiatric: She has a normal mood and affect. Judgment normal.  Nursing note and vitals reviewed.   ED Treatments / Results  DIAGNOSTIC STUDIES: Oxygen Saturation is 97% on RA, normal by my interpretation.    COORDINATION OF CARE: 12:05 PM Discussed treatment plan with pt at bedside and pt agreed to plan. I will check the patient EKG and do a cardiac work-up.  Labs (all labs ordered are listed, but only abnormal results are displayed) Labs Reviewed  BASIC METABOLIC PANEL - Abnormal; Notable for the following:       Result Value   Potassium 3.2 (*)    Glucose, Bld 133 (*)    All other components within normal limits  TROPONIN I - Abnormal; Notable for the following:    Troponin I 0.37 (*)    All other components within normal limits  TROPONIN I - Abnormal; Notable for the following:    Troponin I 0.58 (*)    All other components within normal limits  GLUCOSE, CAPILLARY - Abnormal; Notable for the following:    Glucose-Capillary 127 (*)    All other components within normal limits  TROPONIN I - Abnormal; Notable for the following:    Troponin I 1.01 (*)    All other components within normal  limits  TROPONIN I - Abnormal; Notable for the following:    Troponin I 1.30 (*)    All other components within normal limits  TROPONIN I - Abnormal; Notable for the  following:    Troponin I 1.33 (*)    All other components within normal limits  CBC - Abnormal; Notable for the following:    RDW 15.8 (*)    All other components within normal limits  BASIC METABOLIC PANEL - Abnormal; Notable for the following:    Potassium 3.2 (*)    Glucose, Bld 126 (*)    All other components within normal limits  PROTIME-INR - Abnormal; Notable for the following:    Prothrombin Time 15.4 (*)    All other components within normal limits  BASIC METABOLIC PANEL - Abnormal; Notable for the following:    Glucose, Bld 120 (*)    All other components within normal limits  GLUCOSE, CAPILLARY - Abnormal; Notable for the following:    Glucose-Capillary 108 (*)    All other components within normal limits  GLUCOSE, CAPILLARY - Abnormal; Notable for the following:    Glucose-Capillary 110 (*)    All other components within normal limits  CBC  HEMOGLOBIN A1C  LIPID PANEL  MAGNESIUM  GLUCOSE, CAPILLARY  I-STAT TROPOININ, ED    EKG  EKG Interpretation  Date/Time:  Sunday November 02 2016 11:13:54 EST Ventricular Rate:  112 PR Interval:    QRS Duration: 94 QT Interval:  348 QTC Calculation: 475 R Axis:   -137 Text Interpretation:  Right and left arm electrode reversal, interpretation assumes no reversal Sinus tachycardia Inferior infarct, old Anterior infarct, old Lateral leads are also involved Confirmed by Jesenia Spera  MD, Crawford Tamura ([id number]) on 11/02/2016 12:04:17 PM       Radiology Dg Chest 2 View  Result Date: 11/02/2016 CLINICAL DATA:  CHEST PAIN, SMOKER, Smoker, with N/V last night, this am CP to L Chest HISTORY OF DM, HTN, MI, STROKE, CAD, GERD EXAM: CHEST  2 VIEW COMPARISON:  09/23/2016 FINDINGS: Cardiac silhouette is normal in size and configuration. No mediastinal or hilar masses. No evidence of  adenopathy. Lungs are mildly hyperexpanded but clear. No pleural effusion.  No pneumothorax. Skeletal structures are demineralized but intact. IMPRESSION: No active cardiopulmonary disease. Electronically Signed   By: David  Ormond M.D.   On: 11/02/2016 12:33    Procedures Procedures (including critical care time)  CRITICAL CARE Performed by: Macayla Ekdahl Total critical care time: 35 minutes Critical care time was exclusive of separately billable procedures and treating other patients. Critical care was necessary to treat or prevent imminent or life-threatening deterioration. Critical care was time spent personally by me on the following activities: development of treatment plan with patient and/or surrogate as well as nursing, discussions with consultants, evaluation of patient's response to treatment, examination of patient, obtaining history from patient or surrogate, ordering and performing treatments and interventions, ordering and review of laboratory studies, ordering and review of radiographic studies, pulse oximetry and re-evaluation of patient's condition.   Medications Ordered in ED Medications  aspirin chewable tablet 243 mg (not administered)     Initial Impression / Assessment and Plan / ED Course  I have reviewed the triage vital signs and the nursing notes.  Pertinent labs & imaging results that were available during my care of the patient were reviewed by me and considered in my medical decision making (see chart for details).     75  year old female with a 30 minute episode of chest pain at approximately 7:30 AM this morning. Has since resolved with no recurrent pain. Her EKG is without overt ischemic changes. Initial troponin was normal. Repeat troponin elevated at 0.37. Patient was admitted last month  with similar type symptoms. She had a fairly unremarkable cardiac catheterization. She was managed medically. Her symptoms this morning began at rest. I question whether  this may be vasospastic angina? She is not on a calcium channel blocker. I will discuss with cardiology.  LHC 09/26/16.    The left ventricular systolic function is normal.  LV end diastolic pressure is normal.  The left ventricular ejection fraction is 55-65% by visual estimate.  There is mild aortic valve stenosis.  Mid RCA lesion, 25 %stenosed.  Ost Ramus lesion, 30 %stenosed.    Essentially minimal coronary artery disease withlesions. There is calcification of the mitral valve annulus and mild callus complication the proximal LAD. There does appear to be a stent in the RCA that is essentially patent.  Nothing to explain the patient's positive troponin.  Final Clinical Impressions(s) / ED Diagnoses   Final diagnoses:  NSTEMI (non-ST elevated myocardial infarction) [facility])    New Prescriptions New Prescriptions   No medications on file   I personally preformed the services scribed in my presence. The recorded information has been reviewed is accurate. Virgel Manifold, MD.    Virgel Manifold, MD 11/17/16 [phone]

## 2016-11-02 NOTE — H&P (Addendum)
History and Physical    [name] DOB: 1941-08-24 DOA: 11/02/2016  PCP: Glo Herring., MD  Patient coming from: Home  Chief Complaint: Chest pain  HPI: [name] is a 76 y.o. female with medical history significant of CAD, stroke, NAFLD, IBS, HTN, DM who presented for chest pain. She reports the chest pain as crampy, central chest, substernal, 4/10.  It awoke her from sleep this AM. She took some aspirin and the pain resolved in about an hour.  She did not do any activity to find out if the pain became worse.  It was not reproducible.  She had some nausea last night, but none during the episode.  She had no diaphoresis or palpitations.  Her cardiologist is Dr. Harl Bowie.  She reports continued smoking a couple of times per week.  She was admitted in January for chest pain - LHC at that time revealed mid RCA lesion of 25% stenosed and ost ramus lesion 30% stenosed.  RCA stent appeared patent.   Reports no history of GI bleeding.   ED Course: Troponin in the ED was trending up from 0.37 to 0.58.  She remained chest pain free. EKG was nonischemic.  She was admitted for NSTEMI.   Review of Systems: As per HPI otherwise 10 point review of systems negative.    Past Medical History:  Diagnosis Date  . Anemia   . Aortic insufficiency 07/04/2011   most recent 2D Echo 02/24/2012  EF greater than 55%  . Aortic sclerosis 07/04/2011  . Back pain   . Carotid stenosis, bilateral 07/04/2011   most recent 05/2010 carotid doppler  . Cirrhosis ([place])   . Coronary artery disease   . CVA (cerebral infarction)    03/07/2014  . DDD (degenerative disc disease), lumbar   . Diabetes mellitus 2005  . GERD (gastroesophageal reflux disease)   . Hiatal hernia   . HTN (hypertension) 07/04/2011  . Hypercholesteremia   . IBS (irritable bowel syndrome)   . Myocardial infarction 1995   required 2 stents  . Non-alcoholic fatty liver disease   . OSA (obstructive sleep apnea) 07/04/2011   sleep study 2009  AHI 9.91/hr and during REM sleep 35.58/hr  . Presence of stent in right coronary artery 07/04/2011  . Stroke ([place]) 03/07/2014   no deficits from stroke  . UTI (lower urinary tract infection)    chronic/recurring/on bactrim  . Varices, esophageal ([place])     Past Surgical History:  Procedure Laterality Date  . [place]  . APPENDECTOMY    . BACK SURGERY  1985  . CARDIAC CATHETERIZATION  1995.,2006,1997   stent to the proximal RCA after MI   . CARDIAC CATHETERIZATION N/A 09/26/2016   Procedure: Left Heart Cath and Coronary Angiography;  Surgeon: Leonie Man, MD;  Location: [place] [facility];  Service: Cardiovascular;  Laterality: N/A;  . CATARACT EXTRACTION W/PHACO Right 07/04/2014   Procedure: CATARACT EXTRACTION PHACO AND INTRAOCULAR LENS PLACEMENT (IOC);  Surgeon: Elta Guadeloupe T. Gershon Crane, MD;  Location: [facility];  Service: Ophthalmology;  Laterality: Right;  CDE:13.13  . COLONOSCOPY    . DILATION AND CURETTAGE OF UTERUS     x2  . Epi Retinal Membrane Peel Left   . ERCP    . ESOPHAGEAL BANDING N/A 04/01/2013   Procedure: ESOPHAGEAL BANDING;  Surgeon: Rogene Houston, MD;  [facility];  Service: Endoscopy;  Laterality: N/A;  . ESOPHAGEAL BANDING N/A 05/24/2013   Procedure: ESOPHAGEAL BANDING;  Surgeon: Bernadene Person  Gloriann Loan, MD;  [facility];  Service: Endoscopy;  Laterality: N/A;  . ESOPHAGEAL BANDING N/A 06/21/2014   Procedure: ESOPHAGEAL BANDING;  Surgeon: Rogene Houston, MD;  [facility];  Service: Endoscopy;  Laterality: N/A;  . ESOPHAGOGASTRODUODENOSCOPY N/A 04/01/2013   Procedure: ESOPHAGOGASTRODUODENOSCOPY (EGD);  Surgeon: Rogene Houston, MD;  [facility];  Service: Endoscopy;  Laterality: N/A;  230-rescheduled to 8:30am Ann notified pt  . ESOPHAGOGASTRODUODENOSCOPY N/A 05/24/2013   Procedure: ESOPHAGOGASTRODUODENOSCOPY (EGD);  Surgeon: Rogene Houston, MD;  [facility];  Service: Endoscopy;   Laterality: N/A;  730  . ESOPHAGOGASTRODUODENOSCOPY N/A 06/21/2014   Procedure: ESOPHAGOGASTRODUODENOSCOPY (EGD);  Surgeon: Rogene Houston, MD;  [facility];  Service: Endoscopy;  Laterality: N/A;  930-rescheduled 10/14 @ 1200 Ann to notify pt  . EYE SURGERY  08   cataract surgery of the left eye  . HARDWARE REMOVAL Right 01/17/2013   Procedure: REMOVAL OF HARDWARE AND EXCISION ULNAR STYLOID RIGHT WRIST;  Surgeon: Tennis Must, MD;  Location: [place];  Service: Orthopedics;  Laterality: Right;  . MYRINGOTOMY  2012   both ears  . TONSILLECTOMY    . VAGINAL HYSTERECTOMY  1972  . WRIST SURGERY     rt wrist hardwear removal     reports that she quit smoking about 4 years ago. Her smoking use included Cigarettes. She has a 12.50 pack-year smoking history. She has never used smokeless tobacco. She reports that she does not drink alcohol or use drugs.   She reported to me continued smoking.   Allergies  Allergen Reactions  . Tape Rash    ADHESIVE TAPE   Reviewed with patient Family History  Problem Relation Age of Onset  . Diabetes Mother   . Heart failure Father   . Heart failure Maternal Aunt     Prior to Admission medications   Medication Sig Start Date End Date Taking? Authorizing Provider  acetaminophen (TYLENOL) 500 MG tablet Take 1 tablet (500 mg total) by mouth every 6 (six) hours as needed for moderate pain. Patient taking differently: Take 1,000 mg by mouth every 6 (six) hours as needed for moderate pain.  09/27/16  Yes Cheryln Manly, NP  aspirin EC 81 MG tablet Take 81 mg by mouth daily.   Yes Historical Provider, MD  atorvastatin (LIPITOR) 40 MG tablet Take 40 mg by mouth at bedtime. Reported on 12/17/2015   Yes Historical Provider, MD  carvedilol (COREG) 12.5 MG tablet Take 1 tablet (12.5 mg total) by mouth 2 (two) times daily. 09/27/16  Yes Cheryln Manly, NP  clopidogrel (PLAVIX) 75 MG tablet Take 1 tablet (75 mg total) by mouth daily.  05/06/16  Yes Erline Hau, MD  isosorbide mononitrate (IMDUR) 30 MG 24 hr tablet Take 1 tablet by mouth daily. 04/20/16  Yes Historical Provider, MD  lisinopril (PRINIVIL,ZESTRIL) 20 MG tablet Take 1 tablet (20 mg total) by mouth daily. 09/28/16  Yes Cheryln Manly, NP  omeprazole (PRILOSEC) 20 MG capsule take 1 capsule by mouth twice a day with food 09/30/16  Yes Rogene Houston, MD  ondansetron (ZOFRAN) 8 MG tablet Take 1 tablet (8 mg total) by mouth every 8 (eight) hours as needed for nausea or vomiting. 09/11/16  Yes Butch Penny, NP  zolpidem (AMBIEN) 10 MG tablet Take 10 mg by mouth at bedtime.   Yes Historical Provider, MD    Physical Exam: Vitals:   11/02/16  [place] 11/02/16 2000 11/02/16 2027 11/02/16 2100  BP: 185/67 168/60 168/60 176/59  Pulse: 93 89 94 95  Resp: 20 17 18 17   Temp:      TempSrc:      SpO2: 97% 95% 98% 95%  Weight:      Height:          Constitutional: Elderly thin woman, lying in bed, NAD Vitals:   11/02/16 1935 11/02/16 2000 11/02/16 2027 11/02/16 2100  BP: 185/67 168/60 168/60 176/59  Pulse: 93 89 94 95  Resp: 20 17 18 17   Temp:      TempSrc:      SpO2: 97% 95% 98% 95%  Weight:      Height:       Eyes:  lids and conjunctivae normal ENMT: Mucous membranes are moist.  Neck supple Respiratory: clear to auscultation bilaterally, no wheezing, no crackles. Normal respiratory effort.  Cardiovascular: RR, NR, systolic murmur heard Abdomen: NT, ND, +BS, no masses felt Musculoskeletal: No peripheral edema, warm, well perfused Skin: no rashes, lesions, ulcers. No sacral lesions Neurologic: CN  grossly intact. Strength 5/5 in all 4.  Psychiatric: Normal judgment and insight. Alert and oriented x 3. Normal mood.    Labs on Admission: I have personally reviewed following labs and imaging studies  CBC:  Recent Labs Lab 11/02/16 1131  WBC 5.9  HGB 12.8  HCT 39.1  MCV 84.1  PLT 767   Basic Metabolic Panel:  Recent Labs Lab  11/02/16 1131  NA 142  K 3.2*  CL 108  CO2 24  GLUCOSE 133*  BUN 12  CREATININE 0.84  CALCIUM 9.0   GFR: Estimated Creatinine Clearance: 59.5 mL/min (by C-G formula based on SCr of 0.84 mg/dL). Cardiac Enzymes:  Recent Labs Lab 11/02/16 1351 11/02/16 1648  TROPONINI 0.37* 0.58*      Component Value Date/Time   COLORURINE YELLOW 02/08/2016 0205   APPEARANCEUR HAZY (A) 02/08/2016 0205   LABSPEC 1.025 02/08/2016 0205   PHURINE 5.5 02/08/2016 0205   GLUCOSEU NEGATIVE 02/08/2016 0205   HGBUR NEGATIVE 02/08/2016 0205   BILIRUBINUR NEGATIVE 02/08/2016 0205   KETONESUR NEGATIVE 02/08/2016 0205   PROTEINUR TRACE (A) 02/08/2016 0205   UROBILINOGEN 0.2 07/09/2015 0930   NITRITE NEGATIVE 02/08/2016 0205   LEUKOCYTESUR NEGATIVE 02/08/2016 0205    Radiological Exams on Admission: Dg Chest 2 View  Result Date: 11/02/2016 CLINICAL DATA:  CHEST PAIN, SMOKER, Smoker, with N/V last night, this am CP to L Chest HISTORY OF DM, HTN, MI, STROKE, CAD, GERD EXAM: CHEST  2 VIEW COMPARISON:  09/23/2016 FINDINGS: Cardiac silhouette is normal in size and configuration. No mediastinal or hilar masses. No evidence of adenopathy. Lungs are mildly hyperexpanded but clear. No pleural effusion.  No pneumothorax. Skeletal structures are demineralized but intact. IMPRESSION: No active cardiopulmonary disease. Electronically Signed   By: Lajean Manes M.D.   On: 11/02/2016 12:33    EKG: Independently reviewed. No ST segment elevation, no TWI.  Old infarcts  Assessment/Plan NSTEMI (non-ST elevated myocardial infarction) - Patient is now CP free, however, troponin has doubled to 0.56 - Trend troponin - Telemetry - Will give one time dose of lovenox to cover in case troponin continues to rise - await cardiology recommendations in the AM - Cardiology consult in the AM - AM EKG    CAD (coronary artery disease) - See above - Continue plavix and aspirin - Continue statin, beta blocker and ace-I    DM  type 2 (diabetes mellitus, type  2) - She reports being on no home medications - Check A1C - If hyperglycemic, start SSI    Hypercholesteremia - Continue statin - AM lipid panel    HTN (hypertension) - BP averaging higher this evening; resume coreg for evening dose - Continue lisinopril - Continue imdur    Hypokalemia - K 3.2, replaced orally - Recheck BMET in the AM   DVT prophylaxis: full dose lovenox Code Status: Full Disposition Plan: 1-2 days, needs cardiology consult Consults called: Cards (bensihmon) discussed on phone with EDP Admission status: Inpatient, telemetry   Gilles Chiquito MD [facility] Pager [phone]  If 7PM-7AM, please contact night-coverage [email or web address] Password [email or web address]  11/02/2016, 10:10 PM

## 2016-11-02 NOTE — ED Notes (Signed)
CRITICAL VALUE ALERT  Critical value received:  Troponin = 0.37  Date of notification:  11/02/16  Time of notification:  5997  Critical value read back:Yes.    Nurse who received alert:  Rosealee Albee  MD notified (1st page):  Wilson Singer  Time of first page:  [phone]  MD notified (2nd page):  Time of second page:  Responding MD:  Wilson Singer  Time MD responded:  [phone]

## 2016-11-02 NOTE — ED Notes (Signed)
Attempted to call report- staff reports nurse is busy and will call back.

## 2016-11-02 NOTE — ED Triage Notes (Signed)
Smoker, with N/V last night, this am CP to L Chest 4/10-  Took 81 mg asa at home this am

## 2016-11-02 NOTE — ED Notes (Signed)
CRITICAL VALUE ALERT  Critical value received:  Troponin = 0.58  Date of notification:  11/02/16  Time of notification:  8768  Critical value read back:Yes.    Nurse who received alert:  Rosealee Albee  MD notified (1st page):  Wilson Singer  Time of first page:  1757  MD notified (2nd page):  Time of second page:  Responding MD:  Wilson Singer  Time MD responded:  [phone]

## 2016-11-03 ENCOUNTER — Encounter (HOSPITAL_COMMUNITY): Payer: Self-pay | Admitting: Cardiology

## 2016-11-03 ENCOUNTER — Inpatient Hospital Stay (HOSPITAL_COMMUNITY): Payer: Medicare Other

## 2016-11-03 DIAGNOSIS — I214 Non-ST elevation (NSTEMI) myocardial infarction: Principal | ICD-10-CM

## 2016-11-03 DIAGNOSIS — E119 Type 2 diabetes mellitus without complications: Secondary | ICD-10-CM

## 2016-11-03 DIAGNOSIS — I10 Essential (primary) hypertension: Secondary | ICD-10-CM

## 2016-11-03 DIAGNOSIS — I471 Supraventricular tachycardia: Secondary | ICD-10-CM

## 2016-11-03 DIAGNOSIS — E7800 Pure hypercholesterolemia, unspecified: Secondary | ICD-10-CM

## 2016-11-03 DIAGNOSIS — E876 Hypokalemia: Secondary | ICD-10-CM

## 2016-11-03 DIAGNOSIS — I25118 Atherosclerotic heart disease of native coronary artery with other forms of angina pectoris: Secondary | ICD-10-CM

## 2016-11-03 DIAGNOSIS — I2510 Atherosclerotic heart disease of native coronary artery without angina pectoris: Secondary | ICD-10-CM

## 2016-11-03 DIAGNOSIS — I25119 Atherosclerotic heart disease of native coronary artery with unspecified angina pectoris: Secondary | ICD-10-CM

## 2016-11-03 LAB — LIPID PANEL
CHOL/HDL RATIO: 3.2 ratio
Cholesterol: 148 mg/dL (ref 0–200)
HDL: 46 mg/dL (ref >40)
LDL Cholesterol: 81 mg/dL (ref 0–99)
Triglycerides: 103 mg/dL (ref <150)
VLDL: 21 mg/dL (ref 0–40)

## 2016-11-03 LAB — CBC
HCT: 38.9 % (ref 36.0–46.0)
Hemoglobin: 12.8 g/dL (ref 12.0–15.0)
MCH: 27.8 pg (ref 26.0–34.0)
MCHC: 32.9 g/dL (ref 30.0–36.0)
MCV: 84.4 fL (ref 78.0–100.0)
PLATELETS: 166 10*3/uL (ref 150–400)
RBC: 4.61 MIL/uL (ref 3.87–5.11)
RDW: 15.8 % — AB (ref 11.5–15.5)
WBC: 8.7 10*3/uL (ref 4.0–10.5)

## 2016-11-03 LAB — BASIC METABOLIC PANEL
ANION GAP: 10 (ref 5–15)
BUN: 11 mg/dL (ref 6–20)
CALCIUM: 9.1 mg/dL (ref 8.9–10.3)
CO2: 24 mmol/L (ref 22–32)
Chloride: 105 mmol/L (ref 101–111)
Creatinine, Ser: 0.83 mg/dL (ref 0.44–1.00)
GFR calc Af Amer: >60 mL/min (ref >60)
GLUCOSE: 126 mg/dL — AB (ref 65–99)
Potassium: 3.2 mmol/L — ABNORMAL LOW (ref 3.5–5.1)
Sodium: 139 mmol/L (ref 135–145)

## 2016-11-03 LAB — TROPONIN I
TROPONIN I: 1.33 ng/mL — AB (ref <0.03)
Troponin I: 1.01 ng/mL (ref <0.03)
Troponin I: 1.3 ng/mL (ref <0.03)

## 2016-11-03 LAB — PROTIME-INR
INR: 1.21
Prothrombin Time: 15.4 seconds — ABNORMAL HIGH (ref 11.4–15.2)

## 2016-11-03 LAB — ECHOCARDIOGRAM LIMITED
Height: 63 in
Weight: 2306.89 oz

## 2016-11-03 LAB — GLUCOSE, CAPILLARY: GLUCOSE-CAPILLARY: 108 mg/dL — AB (ref 65–99)

## 2016-11-03 MED ORDER — INSULIN ASPART 100 UNIT/ML ~~LOC~~ SOLN: 0.0000 [IU] | Freq: Three times a day (TID) | SUBCUTANEOUS | Status: DC

## 2016-11-03 MED ORDER — INSULIN ASPART 100 UNIT/ML ~~LOC~~ SOLN: 0.0000 [IU] | Freq: Every day | SUBCUTANEOUS | Status: DC

## 2016-11-03 MED ORDER — POTASSIUM CHLORIDE CRYS ER 20 MEQ PO TBCR
40.0000 meq | EXTENDED_RELEASE_TABLET | ORAL | Status: AC
  Administered 2016-11-03 – 2016-11-04 (×2): 40 meq via ORAL
  Filled 2016-11-03 (×2): qty 2

## 2016-11-03 MED ORDER — CARVEDILOL 12.5 MG PO TABS
25.0000 mg | ORAL_TABLET | Freq: Two times a day (BID) | ORAL | Status: DC
  Administered 2016-11-03 – 2016-11-04 (×2): 25 mg via ORAL
  Filled 2016-11-03 (×2): qty 2

## 2016-11-03 MED ORDER — LISINOPRIL 10 MG PO TABS
10.0000 mg | ORAL_TABLET | Freq: Every day | ORAL | Status: DC
  Administered 2016-11-03 – 2016-11-04 (×2): 10 mg via ORAL
  Filled 2016-11-03 (×2): qty 1

## 2016-11-03 NOTE — Progress Notes (Signed)
*  PRELIMINARY RESULTS* Echocardiogram 2D Echocardiogram limited has been performed.  Leavy Cella 11/03/2016, 11:49 AM

## 2016-11-03 NOTE — Consult Note (Signed)
Requesting provider: Dr. Sid Falcon Primary cardiologist: Dr. Carlyle Dolly Consulting cardiologist: Dr. Satira Sark  Reason for consultation: Chest pain, elevated troponin I  Clinical Summary [name] is a 76 y.o.female with past medical history outlined below, currently admitted to the hospital with chest pain and abnormal troponin I levels. Her history includes remote inferior wall infarct in 1995 treated with stent placement to the RCA, although just recently a follow-up cardiac catheterization in January showing only mild coronary atherosclerosis. She was evaluated in January due to chest pain and abnormal troponin I levels resulting in the follow-up cardiac catheterization. She states that she has been doing well over the last month without any significant exertional symptoms, woke up with chest tightness. Her troponin I trend is higher than it was last time, although she states that her symptoms are less intense. She also mentions that her heart rate increases at times, she felt palpitations when she had chest tightness at night. Chest CTA in January was negative for pulmonary embolus.  She reports compliance with her medications at home including beta blocker and dual antiplatelet therapy.  I reviewed her tracings during this hospital stay which showed nonspecific ST segment changes. Telemetry does show episodes of apparent sinus tachycardia with heart rate up in the 130s, also more brief bursts of what looks to be more consistent SVT.   Allergies  Allergen Reactions  . Tape Rash    ADHESIVE TAPE    Medications Scheduled Medications: . aspirin EC  81 mg Oral Daily  . atorvastatin  40 mg Oral QHS  . carvedilol  25 mg Oral BID WC  . clopidogrel  75 mg Oral Daily  . enoxaparin (LOVENOX) injection  1 mg/kg Subcutaneous Q12H  . isosorbide mononitrate  30 mg Oral Daily  . lisinopril  10 mg Oral Daily  . pantoprazole  40 mg Oral Daily  . zolpidem  5 mg Oral QHS     PRN Medications: acetaminophen, nitroGLYCERIN, ondansetron (ZOFRAN) IV, ondansetron, traMADol   Past Medical History:  Diagnosis Date  . Anemia   . Aortic insufficiency    Moderate  . Aortic stenosis    Mild  . Back pain   . Carotid stenosis, bilateral   . Cirrhosis ([place])   . Coronary artery disease    Stent x 2 RCA 1995, cardiac catheterization 09/2016 showing only mild atherosclerosis  . DDD (degenerative disc disease), lumbar   . Essential hypertension   . GERD (gastroesophageal reflux disease)   . Hiatal hernia   . History of stroke   . Hypercholesteremia   . IBS (irritable bowel syndrome)   . Myocardial infarction 1995  . Non-alcoholic fatty liver disease   . OSA (obstructive sleep apnea)    Sleep study 2009  AHI 9.91/hr and during REM sleep 35.58/hr  . Recurrent UTI   . Type 2 diabetes mellitus ([place])   . Varices, esophageal ([place])     Past Surgical History:  Procedure Laterality Date  . [place]  . APPENDECTOMY    . BACK SURGERY  1985  . CARDIAC CATHETERIZATION  [phone]   Stent to the proximal RCA after MI   . CARDIAC CATHETERIZATION N/A 09/26/2016   Procedure: Left Heart Cath and Coronary Angiography;  Surgeon: Leonie Man, MD;  Location: [place] [facility];  Service: Cardiovascular;  Laterality: N/A;  . CATARACT EXTRACTION W/PHACO Right 07/04/2014   Procedure: CATARACT EXTRACTION PHACO AND INTRAOCULAR LENS PLACEMENT (IOC);  Surgeon: Elta Guadeloupe  Enid Baas, MD;  [facility]: [facility];  Service: Ophthalmology;  Laterality: Right;  CDE:13.13  . COLONOSCOPY    . DILATION AND CURETTAGE OF UTERUS     x2  . Epi Retinal Membrane Peel Left   . ERCP    . ESOPHAGEAL BANDING N/A 04/01/2013   Procedure: ESOPHAGEAL BANDING;  Surgeon: Rogene Houston, MD;  [facility];  Service: Endoscopy;  Laterality: N/A;  . ESOPHAGEAL BANDING N/A 05/24/2013   Procedure: ESOPHAGEAL BANDING;  Surgeon: Rogene Houston, MD;  [facility];  Service:  Endoscopy;  Laterality: N/A;  . ESOPHAGEAL BANDING N/A 06/21/2014   Procedure: ESOPHAGEAL BANDING;  Surgeon: Rogene Houston, MD;  [facility];  Service: Endoscopy;  Laterality: N/A;  . ESOPHAGOGASTRODUODENOSCOPY N/A 04/01/2013   Procedure: ESOPHAGOGASTRODUODENOSCOPY (EGD);  Surgeon: Rogene Houston, MD;  [facility];  Service: Endoscopy;  Laterality: N/A;  230-rescheduled to 8:30am Ann notified pt  . ESOPHAGOGASTRODUODENOSCOPY N/A 05/24/2013   Procedure: ESOPHAGOGASTRODUODENOSCOPY (EGD);  Surgeon: Rogene Houston, MD;  [facility];  Service: Endoscopy;  Laterality: N/A;  730  . ESOPHAGOGASTRODUODENOSCOPY N/A 06/21/2014   Procedure: ESOPHAGOGASTRODUODENOSCOPY (EGD);  Surgeon: Rogene Houston, MD;  [facility];  Service: Endoscopy;  Laterality: N/A;  930-rescheduled 10/14 @ 1200 Ann to notify pt  . EYE SURGERY  08   cataract surgery of the left eye  . HARDWARE REMOVAL Right 01/17/2013   Procedure: REMOVAL OF HARDWARE AND EXCISION ULNAR STYLOID RIGHT WRIST;  Surgeon: Tennis Must, MD;  Location: [place];  Service: Orthopedics;  Laterality: Right;  . MYRINGOTOMY  2012   both ears  . TONSILLECTOMY    . VAGINAL HYSTERECTOMY  1972  . WRIST SURGERY     rt wrist hardwear removal    Family History  Problem Relation Age of Onset  . Diabetes Mother   . Heart failure Father   . Heart failure Maternal Aunt     Social History Ms. Nusser reports that she quit smoking about 4 years ago. Her smoking use included Cigarettes. She has a 12.50 pack-year smoking history. She has never used smokeless tobacco. Ms. Tucholski reports that she does not drink alcohol.  Review of Systems Complete review of systems negative except as otherwise outlined in the clinical summary and also the following. No orthopnea or PND. No syncope.  Physical Examination Blood pressure (!) 116/53, pulse (!) 116, temperature 98.4 F (36.9 C), temperature source Oral,  resp. rate 18, height 5' 3"  (1.6 m), weight 144 lb 2.9 oz (65.4 kg), SpO2 96 %.  Intake/Output Summary (Last 24 hours) at 11/03/16 1007 Last data filed at 11/03/16 0548  Gross per 24 hour  Intake                0 ml  Output              400 ml  Net             -400 ml   Telemetry: Sinus rhythm at present.  Gen.: Patient appears comfortable at rest. No active chest pain. HEENT: Conjunctiva and lids normal, oropharynx clear. Neck: Supple, no elevated JVP or carotid bruits, no thyromegaly. Lungs: Clear to auscultation, nonlabored breathing at rest. Cardiac: Regular rate and rhythm, no S3, soft systolic murmur, no pericardial rub. Abdomen: Soft, nontender, bowel sounds present, no guarding or rebound. Extremities: No pitting edema, distal pulses 2+. Skin: Warm and dry. Musculoskeletal: No kyphosis. Neuropsychiatric:  Alert and oriented x3, affect grossly appropriate.   Lab Results  Basic Metabolic Panel:  Recent Labs Lab 11/02/16 1131 11/03/16 0526  NA 142 139  K 3.2* 3.2*  CL 108 105  CO2 24 24  GLUCOSE 133* 126*  BUN 12 11  CREATININE 0.84 0.83  CALCIUM 9.0 9.1    CBC:  Recent Labs Lab 11/02/16 1131 11/03/16 0526  WBC 5.9 8.7  HGB 12.8 12.8  HCT 39.1 38.9  MCV 84.1 84.4  PLT 161 166    Cardiac Enzymes:  Recent Labs Lab 11/02/16 1351 11/02/16 1648 11/02/16 2312 11/03/16 0526  TROPONINI 0.37* 0.58* 1.01* 1.30*    ECG I personally reviewed the tracing from 11/02/2016 which showed sinus tachycardia with limb lead reversal, prior inferior infarct pattern, poor R-wave progression, nonspecific ST changes.  Imaging Cardiac catheterization 09/26/2016:  The left ventricular systolic function is normal.  LV end diastolic pressure is normal.  The left ventricular ejection fraction is 55-65% by visual estimate.  There is mild aortic valve stenosis.  Mid RCA lesion, 25 %stenosed.  Ost Ramus lesion, 30 %stenosed.    Essentially minimal coronary artery  disease withlesions. There is calcification of the mitral valve annulus and mild callus complication the proximal LAD. There does appear to be a stent in the RCA that is essentially patent.  Echocardiogram 09/24/2016: Study Conclusions  - Left ventricle: The cavity size was normal. Wall thickness was   increased in a pattern of mild LVH. Systolic function was   vigorous. The estimated ejection fraction was in the range of 65%   to 70%. The study is not technically sufficient to allow   evaluation of LV diastolic function. - Aortic valve: Mildly calcified annulus. Trileaflet; mildly   thickened leaflets. There was mild stenosis. There was moderate   regurgitation. Valve area (VTI): 1.98 cm^2. Valve area (Vmax):   1.87 cm^2. Valve area (Vmean): 1.93 cm^2. - Mitral valve: Severely calcified annulus. Mildly thickened   leaflets . - Left atrium: The atrium was severely dilated. - Atrial septum: No defect or patent foramen ovale was identified. - Technically adequate study.  Impression  1. Recurrent chest pain and abnormal troponin I levels. With her history, angina and NSTEMI remain a possibility, although the recent cardiac catheterization from one month ago was reassuring with patent stent sites and otherwise mild atherosclerosis. It could be that she is having transient arrhythmia as an etiology with increase in heart rate documented. She reports compliance with her medications including Coreg. No fevers or chills, no increasing shortness of breath with activity. LVEF was normal as of last month.  2. Essential hypertension, currently on Coreg, lisinopril, Imdur.  3. Hyperlipidemia, on Lipitor. LDL 64.  4. Aortic valve disease, mild stenosis and moderate regurgitation documented recently. Asymptomatic.  Recommendations  Reviewed chart and discussed with the patient. We will increase Coreg to 25 mg twice daily to see if better heart rate control and suppression of potential SVT may lead  to improvement in symptoms. Also obtain a follow-up limited echocardiogram to exclude cardiomyopathy/myocarditis with elevated troponin levels, although doubt at this point. Otherwise continue medical therapy including aspirin and Plavix. Not clear that we need to pursue follow-up coronary angiography as yet.  Satira Sark, M.D., F.A.C.C.

## 2016-11-03 NOTE — Progress Notes (Signed)
PROGRESS NOTE    [name]  PQZ:[id number] DOB: February 19, 1941 DOA: 11/02/2016 PCP: Glo Herring., MD    Brief Narrative:  76 year old female with history of coronary disease, hypertension and diabetes who presents to the hospital with complaints of chest pain. She was admitted to the hospital for further evaluation. Troponin peaked at 1.3. Cardiology has been following. She's been having episodes of tachycardia/SVT which could be leading to her discomfort and elevated troponin. Coreg dose has been increased. Continue to monitor overnight.   Assessment & Plan:   Active Problems:   CAD (coronary artery disease)   DM type 2 (diabetes mellitus, type 2) ([place])   Hypercholesteremia   HTN (hypertension)   Hypokalemia   NSTEMI (non-ST elevated myocardial infarction) ([place])   1. NSTEMI. Troponin elevation to 1.3. No further chest pain. Cardiology following. She is on beta blockers, antiplatelet and therapeutic Lovenox.  2. Episodes of SVT. Patient has been having episodes tachycardia with telemetry shows possible SVT. This could also be causing her elevation of troponin. Coreg dose has been increased. Continue to monitor.  3. Hyperlipidemia. LDL 64. Continue Lipitor.  4. Hypertension. Blood pressure currently stable. Continue Coreg, lisinopril and Imdur.  5. Hypokalemia. Replace. Check magnesium.  6. Diabetes. Monitor blood sugars. A1c in process.   DVT prophylaxis: lovenox Code Status: full Family Communication: discussed with patient, no family present Disposition Plan: discharge home once improved   Consultants:   cardiology  Procedures:  Echo: - Mild LVH with LVEF 65-70%. Indeterminate diastolic function. Mild   left atrial enlargement. Moderate to severe mitral annular   calcification with trivial mitral regurgitation. Aortic valve   poorly visualized and appears sclerotic with associated mild to   moderate aortic regurgitation. Small anterior pericardial     effusion noted.  Antimicrobials:      Subjective: Chest pain resolved. No shortness of breath.  Objective: Vitals:   11/02/16 2100 11/02/16 2227 11/03/16 0548 11/03/16 1300  BP: 176/59 (!) 189/64 (!) 116/53 (!) 138/59  Pulse: 95 (!) 101 (!) 116 95  Resp: 17 17 18 18   Temp:  98.7 F (37.1 C) 98.4 F (36.9 C) 99.2 F (37.3 C)  TempSrc:  Oral Oral Oral  SpO2: 95% 100% 96% 96%  Weight:  65.4 kg (144 lb 2.9 oz)    Height:  5' 3"  (1.6 m)      Intake/Output Summary (Last 24 hours) at 11/03/16 1832 Last data filed at 11/03/16 1756  Gross per 24 hour  Intake              720 ml  Output              400 ml  Net              320 ml   Filed Weights   11/02/16 1114 11/02/16 2227  Weight: 81.6 kg (180 lb) 65.4 kg (144 lb 2.9 oz)    Examination:  General exam: Appears calm and comfortable  Respiratory system: Clear to auscultation. Respiratory effort normal. Cardiovascular system: S1 & S2 heard, RRR. No JVD, murmurs, rubs, gallops or clicks. No pedal edema. Gastrointestinal system: Abdomen is nondistended, soft and nontender. No organomegaly or masses felt. Normal bowel sounds heard. Central nervous system: Alert and oriented. No focal neurological deficits. Extremities: Symmetric 5 x 5 power. Skin: No rashes, lesions or ulcers Psychiatry: Judgement and insight appear normal. Mood & affect appropriate.     Data Reviewed: I have personally reviewed following labs and imaging studies  CBC:  Recent Labs Lab 11/02/16 1131 11/03/16 0526  WBC 5.9 8.7  HGB 12.8 12.8  HCT 39.1 38.9  MCV 84.1 84.4  PLT 161 094   Basic Metabolic Panel:  Recent Labs Lab 11/02/16 1131 11/03/16 0526  NA 142 139  K 3.2* 3.2*  CL 108 105  CO2 24 24  GLUCOSE 133* 126*  BUN 12 11  CREATININE 0.84 0.83  CALCIUM 9.0 9.1   GFR: Estimated Creatinine Clearance: 54.1 mL/min (by C-G formula based on SCr of 0.83 mg/dL). Liver Function Tests: No results for input(s): AST, ALT, ALKPHOS,  BILITOT, PROT, ALBUMIN in the last 168 hours. No results for input(s): LIPASE, AMYLASE in the last 168 hours. No results for input(s): AMMONIA in the last 168 hours. Coagulation Profile:  Recent Labs Lab 11/03/16 0526  INR 1.21   Cardiac Enzymes:  Recent Labs Lab 11/02/16 1351 11/02/16 1648 11/02/16 2312 11/03/16 0526 11/03/16 1132  TROPONINI 0.37* 0.58* 1.01* 1.30* 1.33*   BNP (last 3 results) No results for input(s): PROBNP in the last 8760 hours. HbA1C: No results for input(s): HGBA1C in the last 72 hours. CBG:  Recent Labs Lab 11/02/16 2206  GLUCAP 127*   Lipid Profile:  Recent Labs  11/03/16 0526  CHOL 148  HDL 46  LDLCALC 81  TRIG 103  CHOLHDL 3.2   Thyroid Function Tests: No results for input(s): TSH, T4TOTAL, FREET4, T3FREE, THYROIDAB in the last 72 hours. Anemia Panel: No results for input(s): VITAMINB12, FOLATE, FERRITIN, TIBC, IRON, RETICCTPCT in the last 72 hours. Sepsis Labs: No results for input(s): PROCALCITON, LATICACIDVEN in the last 168 hours.  No results found for this or any previous visit (from the past 240 hour(s)).       Radiology Studies: Dg Chest 2 View  Result Date: 11/02/2016 CLINICAL DATA:  CHEST PAIN, SMOKER, Smoker, with N/V last night, this am CP to L Chest HISTORY OF DM, HTN, MI, STROKE, CAD, GERD EXAM: CHEST  2 VIEW COMPARISON:  09/23/2016 FINDINGS: Cardiac silhouette is normal in size and configuration. No mediastinal or hilar masses. No evidence of adenopathy. Lungs are mildly hyperexpanded but clear. No pleural effusion.  No pneumothorax. Skeletal structures are demineralized but intact. IMPRESSION: No active cardiopulmonary disease. Electronically Signed   By: Lajean Manes M.D.   On: 11/02/2016 12:33        Scheduled Meds: . aspirin EC  81 mg Oral Daily  . atorvastatin  40 mg Oral QHS  . carvedilol  25 mg Oral BID WC  . clopidogrel  75 mg Oral Daily  . enoxaparin (LOVENOX) injection  1 mg/kg Subcutaneous Q12H   . isosorbide mononitrate  30 mg Oral Daily  . lisinopril  10 mg Oral Daily  . pantoprazole  40 mg Oral Daily  . zolpidem  5 mg Oral QHS   Continuous Infusions:   LOS: 1 day    Time spent: 14mns    Hally Colella, MD [facility] Pager [phone] If 7PM-7AM, please contact night-coverage [email or web address] Password [email or web address]2/26/2018, 6:32 PM

## 2016-11-03 NOTE — Care Management Note (Signed)
Case Management Note  Patient Details  Name: Anaeli Cornwall MRN: [id number] Date of Birth: August 17, 1941  Subjective/Objective: Patient adm with NSTEMI, recent heart cath in January. She is from home alone, ind with ADL's. She has no HH/DME PTA. Declines HH currenlty. She has PCP, drives to appointments, and reports no issues with affording medications.                   Action/Plan: Anticipate DC home with self care.   Expected Discharge Date:      11/04/2016            Expected Discharge Plan:  Home/Self Care  In-House Referral:     Discharge planning Services  CM Consult  Post Acute Care Choice:  NA Choice offered to:  NA  DME Arranged:    DME Agency:     HH Arranged:    HH Agency:     Status of Service:  Completed, signed off  If discussed at [employer] of Stay Meetings, dates discussed:    Additional Comments:  Alarik Radu, Chauncey Reading, RN 11/03/2016, 11:24 AM

## 2016-11-04 LAB — HEMOGLOBIN A1C
Hgb A1c MFr Bld: 5.4 % (ref 4.8–5.6)
MEAN PLASMA GLUCOSE: 108 mg/dL

## 2016-11-04 LAB — BASIC METABOLIC PANEL
ANION GAP: 7 (ref 5–15)
BUN: 10 mg/dL (ref 6–20)
CO2: 24 mmol/L (ref 22–32)
Calcium: 9 mg/dL (ref 8.9–10.3)
Chloride: 109 mmol/L (ref 101–111)
Creatinine, Ser: 0.86 mg/dL (ref 0.44–1.00)
GFR calc Af Amer: >60 mL/min (ref >60)
Glucose, Bld: 120 mg/dL — ABNORMAL HIGH (ref 65–99)
POTASSIUM: 4 mmol/L (ref 3.5–5.1)
SODIUM: 140 mmol/L (ref 135–145)

## 2016-11-04 LAB — GLUCOSE, CAPILLARY
GLUCOSE-CAPILLARY: 96 mg/dL (ref 65–99)
Glucose-Capillary: 110 mg/dL — ABNORMAL HIGH (ref 65–99)

## 2016-11-04 LAB — MAGNESIUM: MAGNESIUM: 1.7 mg/dL (ref 1.7–2.4)

## 2016-11-04 MED ORDER — CARVEDILOL 25 MG PO TABS: 25.0000 mg | ORAL_TABLET | Freq: Two times a day (BID) | ORAL | 1 refills | Status: DC

## 2016-11-04 MED ORDER — LISINOPRIL 20 MG PO TABS: 10.0000 mg | ORAL_TABLET | Freq: Every day | ORAL | 6 refills | Status: DC

## 2016-11-04 NOTE — Progress Notes (Signed)
Progress Note  Patient Name: [name] Date of Encounter: 11/04/2016  Primary Cardiologist: Dr. Carlyle Dolly  Subjective   No recurrent chest pain or shortness of breath at rest.  Inpatient Medications    Scheduled Meds: . aspirin EC  81 mg Oral Daily  . atorvastatin  40 mg Oral QHS  . carvedilol  25 mg Oral BID WC  . clopidogrel  75 mg Oral Daily  . enoxaparin (LOVENOX) injection  1 mg/kg Subcutaneous Q12H  . insulin aspart  0-5 Units Subcutaneous QHS  . insulin aspart  0-9 Units Subcutaneous TID WC  . isosorbide mononitrate  30 mg Oral Daily  . lisinopril  10 mg Oral Daily  . pantoprazole  40 mg Oral Daily  . zolpidem  5 mg Oral QHS    PRN Meds: acetaminophen, nitroGLYCERIN, ondansetron (ZOFRAN) IV, ondansetron, traMADol   Vital Signs    Vitals:   11/03/16 1300 11/03/16 2207 11/04/16 0439 11/04/16 0757  BP: (!) 138/59 (!) 157/55 (!) 130/37   Pulse: 95 89 89 (!) 101  Resp: 18 18 18    Temp: 99.2 F (37.3 C) 99.1 F (37.3 C) 98.4 F (36.9 C)   TempSrc: Oral Oral Oral   SpO2: 96% 96% 97%   Weight:      Height:        Intake/Output Summary (Last 24 hours) at 11/04/16 9767 Last data filed at 11/03/16 1756  Gross per 24 hour  Intake              480 ml  Output                0 ml  Net              480 ml   Filed Weights   11/02/16 1114 11/02/16 2227  Weight: 180 lb (81.6 kg) 144 lb 2.9 oz (65.4 kg)    Telemetry    I personally reviewed telemetry which shows bursts of SVT, no pauses.  ECG    I personally reviewed the tracing from 11/04/2016 which shows sinus rhythm with possible old inferior infarct pattern, poor R-wave progression.  Physical Exam   GEN: No acute distress.   Neck: No JVD Cardiac: RRR, no murmurs, rubs, or gallops.  Respiratory: Clear to auscultation bilaterally. GI: Soft, nontender, non-distended  MS: No edema; No deformity.   Labs    Chemistry Recent Labs Lab 11/02/16 1131 11/03/16 0526 11/04/16 0458  NA 142 139  140  K 3.2* 3.2* 4.0  CL 108 105 109  CO2 24 24 24   GLUCOSE 133* 126* 120*  BUN 12 11 10   CREATININE 0.84 0.83 0.86  CALCIUM 9.0 9.1 9.0  GFRNONAA >60 >60 >60  GFRAA >60 >60 >60  ANIONGAP 10 10 7      Hematology Recent Labs Lab 11/02/16 1131 11/03/16 0526  WBC 5.9 8.7  RBC 4.65 4.61  HGB 12.8 12.8  HCT 39.1 38.9  MCV 84.1 84.4  MCH 27.5 27.8  MCHC 32.7 32.9  RDW 15.5 15.8*  PLT 161 166    Cardiac Enzymes Recent Labs Lab 11/02/16 1648 11/02/16 2312 11/03/16 0526 11/03/16 1132  TROPONINI 0.58* 1.01* 1.30* 1.33*    Recent Labs Lab 11/02/16 1143  TROPIPOC 0.08     Radiology    Dg Chest 2 View  Result Date: 11/02/2016 CLINICAL DATA:  CHEST PAIN, SMOKER, Smoker, with N/V last night, this am CP to L Chest HISTORY OF DM, HTN, MI, STROKE, CAD, GERD EXAM: CHEST  2 VIEW COMPARISON:  09/23/2016 FINDINGS: Cardiac silhouette is normal in size and configuration. No mediastinal or hilar masses. No evidence of adenopathy. Lungs are mildly hyperexpanded but clear. No pleural effusion.  No pneumothorax. Skeletal structures are demineralized but intact. IMPRESSION: No active cardiopulmonary disease. Electronically Signed   By: Lajean Manes M.D.   On: 11/02/2016 12:33    Cardiac Studies   Echocardiogram 11/04/2016: Study Conclusions  - Left ventricle: The cavity size was normal. Wall thickness was   increased in a pattern of mild LVH. Systolic function was   vigorous. The estimated ejection fraction was in the range of 65%   to 70%. Wall motion was normal; there were no regional wall   motion abnormalities. The study is not technically sufficient to   allow evaluation of LV diastolic function. - Aortic valve: Poorly visualized. Mildly calcified leaflets. There   was mild to moderate regurgitation. Mean gradient (S): 15 mm Hg.   Valve area (VTI): 1.83 cm^2. - Mitral valve: Moderately to severely calcified annulus. There was   trivial regurgitation. - Left atrium: The  atrium was mildly dilated. - Tricuspid valve: There was trivial regurgitation. - Pulmonary arteries: Systolic pressure could not be accurately   estimated. - Pericardium, extracardiac: A small pericardial effusion was   identified anterior to the heart.  Impressions:  - Mild LVH with LVEF 65-70%. Indeterminate diastolic function. Mild   left atrial enlargement. Moderate to severe mitral annular   calcification with trivial mitral regurgitation. Aortic valve   poorly visualized and appears sclerotic with associated mild to   moderate aortic regurgitation. Small anterior pericardial   effusion noted.  Patient Profile     76 y.o. female with a history of remote inferior wall infarct in 1995 treated with stent placement to the RCA, recent cardiac catheterization in January showing only mild coronary atherosclerosis with patent stent site, and presentation with chest pain associated with abnormal troponin I level in the range of NSTEMI. She has had some episodes of SVT noted by monitoring which could be related to her symptoms and lab work abnormalities. LVEF is normal by follow-up echocardiogram. There is a small anterior pericardial effusion. Beta blocker dose was increased.  Assessment & Plan    1. NSTEMI, suspect related to demand ischemia particularly in light of recent reassuring cardiac catheterization findings. LVEF normal range by follow-up echocardiogram, no wall motion abnormalities.  2. Episodes of SVT noted by telemetry monitoring, question whether related to problem #1. Coreg dose has been doubled.  3. Mild to moderate aortic regurgitation, asymptomatic.  4. Small anterior pericardial effusion. No fevers or chills.  At this point no further cardiac testing is planned. She feels better symptomatically, would continue Coreg 25 mg twice daily (lisinopril was reduced to 10 mg daily as well). Continue remaining cardiac regimen including aspirin and Plavix. We will schedule a  follow-up visit in a week in the office. Anticipate discharge today.  Signed, Rozann Lesches, MD  11/04/2016, 8:22 AM

## 2016-11-04 NOTE — Discharge Summary (Signed)
Physician Discharge Summary  [name] MIW:[id number] DOB: 1940/10/16 DOA: 11/02/2016  PCP: Glo Herring., MD  Admit date: 11/02/2016 Discharge date: 11/04/2016  Admitted From: Home Disposition:  Home  Recommendations for Outpatient Follow-up:  1. Follow up with PCP in 1-2 weeks 2. Please obtain BMP/CBC in one week 3. Patient will be scheduled to follow-up with cardiology for further management.  Discharge Condition:stable CODE STATUS: full code Diet recommendation: Heart Healthy / Carb Modified   Brief/Interim Summary: 76 year old female with history of coronary disease, hypertension and diabetes who presented to the hospital with complaints of chest pain. She was admitted to the hospital for evaluation. She was having episodes of tachycardia/SVT which could be leading to her discomfort. She was seen by cardiology who increased her Coreg to 25 mg twice a day. Troponins were cycled and peaked at 1.3. She is felt to have a NSTEMI related to possible demand ischemia from tachycardia. She recently had a cardiac catheterization one month ago which did not show any significant lesions. Since increasing her Coreg, she's not had any recurrence of symptoms. She will follow up with cardiology as an outpatient.  Discharge Diagnoses:  Active Problems:   CAD (coronary artery disease)   DM type 2 (diabetes mellitus, type 2) ([place])   Hypercholesteremia   HTN (hypertension)   Hypokalemia   NSTEMI (non-ST elevated myocardial infarction) [facility])    Discharge Instructions  Discharge Instructions    Diet - low sodium heart healthy    Complete by:  As directed    Increase activity slowly    Complete by:  As directed      Allergies as of 11/04/2016      Reactions   Tape Rash   ADHESIVE TAPE      Medication List    TAKE these medications   acetaminophen 500 MG tablet Commonly known as:  TYLENOL Take 1 tablet (500 mg total) by mouth every 6 (six) hours as needed for moderate pain. What  changed:  how much to take   aspirin EC 81 MG tablet Take 81 mg by mouth daily.   atorvastatin 40 MG tablet Commonly known as:  LIPITOR Take 40 mg by mouth at bedtime. Reported on 12/17/2015   carvedilol 25 MG tablet Commonly known as:  COREG Take 1 tablet (25 mg total) by mouth 2 (two) times daily. What changed:  medication strength  how much to take   clopidogrel 75 MG tablet Commonly known as:  PLAVIX Take 1 tablet (75 mg total) by mouth daily.   isosorbide mononitrate 30 MG 24 hr tablet Commonly known as:  IMDUR Take 1 tablet by mouth daily.   lisinopril 20 MG tablet Commonly known as:  PRINIVIL,ZESTRIL Take 0.5 tablets (10 mg total) by mouth daily. What changed:  how much to take   omeprazole 20 MG capsule Commonly known as:  PRILOSEC take 1 capsule by mouth twice a day with food   ondansetron 8 MG tablet Commonly known as:  ZOFRAN Take 1 tablet (8 mg total) by mouth every 8 (eight) hours as needed for nausea or vomiting.   zolpidem 10 MG tablet Commonly known as:  AMBIEN Take 10 mg by mouth at bedtime.      Follow-up Information    Jory Sims, NP Follow up on 11/11/2016.   Specialties:  Nurse Practitioner, Radiology, Cardiology Why:  at 3:30 pm Contact information: [place] [phone]          Allergies  Allergen Reactions  .  Tape Rash    ADHESIVE TAPE    Consultations:  Cardiology   Procedures/Studies: Dg Chest 2 View  Result Date: 11/02/2016 CLINICAL DATA:  CHEST PAIN, SMOKER, Smoker, with N/V last night, this am CP to L Chest HISTORY OF DM, HTN, MI, STROKE, CAD, GERD EXAM: CHEST  2 VIEW COMPARISON:  09/23/2016 FINDINGS: Cardiac silhouette is normal in size and configuration. No mediastinal or hilar masses. No evidence of adenopathy. Lungs are mildly hyperexpanded but clear. No pleural effusion.  No pneumothorax. Skeletal structures are demineralized but intact. IMPRESSION: No active cardiopulmonary  disease. Electronically Signed   By: Lajean Manes M.D.   On: 11/02/2016 12:33       Subjective: Patient is feeling better. No chest pain or shortness of breath.  Discharge Exam: Vitals:   11/04/16 0439 11/04/16 0757  BP: (!) 130/37   Pulse: 89 (!) 101  Resp: 18   Temp: 98.4 F (36.9 C)    Vitals:   11/03/16 1300 11/03/16 2207 11/04/16 0439 11/04/16 0757  BP: (!) 138/59 (!) 157/55 (!) 130/37   Pulse: 95 89 89 (!) 101  Resp: 18 18 18    Temp: 99.2 F (37.3 C) 99.1 F (37.3 C) 98.4 F (36.9 C)   TempSrc: Oral Oral Oral   SpO2: 96% 96% 97%   Weight:      Height:        General: Pt is alert, awake, not in acute distress Cardiovascular: RRR, S1/S2 +, no rubs, no gallops Respiratory: CTA bilaterally, no wheezing, no rhonchi Abdominal: Soft, NT, ND, bowel sounds + Extremities: no edema, no cyanosis    The results of significant diagnostics from this hospitalization (including imaging, microbiology, ancillary and laboratory) are listed below for reference.     Microbiology: No results found for this or any previous visit (from the past 240 hour(s)).   Labs: BNP (last 3 results) No results for input(s): BNP in the last 8760 hours. Basic Metabolic Panel:  Recent Labs Lab 11/02/16 1131 11/03/16 0526 11/04/16 0458  NA 142 139 140  K 3.2* 3.2* 4.0  CL 108 105 109  CO2 24 24 24   GLUCOSE 133* 126* 120*  BUN 12 11 10   CREATININE 0.84 0.83 0.86  CALCIUM 9.0 9.1 9.0  MG  --   --  1.7   Liver Function Tests: No results for input(s): AST, ALT, ALKPHOS, BILITOT, PROT, ALBUMIN in the last 168 hours. No results for input(s): LIPASE, AMYLASE in the last 168 hours. No results for input(s): AMMONIA in the last 168 hours. CBC:  Recent Labs Lab 11/02/16 1131 11/03/16 0526  WBC 5.9 8.7  HGB 12.8 12.8  HCT 39.1 38.9  MCV 84.1 84.4  PLT 161 166   Cardiac Enzymes:  Recent Labs Lab 11/02/16 1351 11/02/16 1648 11/02/16 2312 11/03/16 0526 11/03/16 1132   TROPONINI 0.37* 0.58* 1.01* 1.30* 1.33*   BNP: Invalid input(s): POCBNP CBG:  Recent Labs Lab 11/02/16 2206 11/03/16 2131 11/04/16 0741 11/04/16 1203  GLUCAP 127* 108* 110* 96   D-Dimer No results for input(s): DDIMER in the last 72 hours. Hgb A1c  Recent Labs  11/02/16 1131  HGBA1C 5.4   Lipid Profile  Recent Labs  11/03/16 0526  CHOL 148  HDL 46  LDLCALC 81  TRIG 103  CHOLHDL 3.2   Thyroid function studies No results for input(s): TSH, T4TOTAL, T3FREE, THYROIDAB in the last 72 hours.  Invalid input(s): FREET3 Anemia work up No results for input(s): VITAMINB12, FOLATE, FERRITIN, TIBC, IRON, RETICCTPCT  in the last 72 hours. Urinalysis    Component Value Date/Time   COLORURINE YELLOW 02/08/2016 0205   APPEARANCEUR HAZY (A) 02/08/2016 0205   LABSPEC 1.025 02/08/2016 0205   PHURINE 5.5 02/08/2016 0205   GLUCOSEU NEGATIVE 02/08/2016 0205   HGBUR NEGATIVE 02/08/2016 0205   BILIRUBINUR NEGATIVE 02/08/2016 0205   KETONESUR NEGATIVE 02/08/2016 0205   PROTEINUR TRACE (A) 02/08/2016 0205   UROBILINOGEN 0.2 07/09/2015 0930   NITRITE NEGATIVE 02/08/2016 0205   LEUKOCYTESUR NEGATIVE 02/08/2016 0205   Sepsis Labs Invalid input(s): PROCALCITONIN,  WBC,  LACTICIDVEN Microbiology No results found for this or any previous visit (from the past 240 hour(s)).   Time coordinating discharge: Over 30 minutes  SIGNED:   Kathie Dike, MD  [facility] 11/04/2016, 1:34 PM Pager   If 7PM-7AM, please contact night-coverage [email or web address] Password [email or web address]

## 2016-11-04 NOTE — Progress Notes (Signed)
Patient is to be discharged home and in stable condition. Patient verbalizes understanding of changes to medication regimen and follow-up appointments. Patient awaiting transportation now, will be escorted out by staff.  Celestia Khat, RN

## 2016-11-05 ENCOUNTER — Telehealth: Payer: Self-pay | Admitting: Adult Health

## 2016-11-05 MED ORDER — CLOPIDOGREL BISULFATE 75 MG PO TABS: 75.0000 mg | ORAL_TABLET | Freq: Every day | ORAL | 2 refills | Status: DC

## 2016-11-05 NOTE — Telephone Encounter (Signed)
Needs RX for Plavix sent to [facility]  tg

## 2016-11-05 NOTE — Telephone Encounter (Signed)
Refills sent to pharmacy. 

## 2016-11-06 ENCOUNTER — Ambulatory Visit: Payer: Self-pay | Admitting: Adult Health

## 2016-11-08 ENCOUNTER — Emergency Department (HOSPITAL_COMMUNITY)
Admission: EM | Admit: 2016-11-08 | Discharge: 2016-11-08 | Disposition: A | Discharge status: Home / Self Care | Payer: Medicare Other | Attending: Emergency Medicine | Admitting: Emergency Medicine

## 2016-11-08 ENCOUNTER — Encounter (HOSPITAL_COMMUNITY): Payer: Self-pay

## 2016-11-08 ENCOUNTER — Emergency Department (HOSPITAL_COMMUNITY): Payer: Medicare Other

## 2016-11-08 DIAGNOSIS — Z79899 Other long term (current) drug therapy: Secondary | ICD-10-CM | POA: Diagnosis not present

## 2016-11-08 DIAGNOSIS — Z87891 Personal history of nicotine dependence: Secondary | ICD-10-CM | POA: Insufficient documentation

## 2016-11-08 DIAGNOSIS — R110 Nausea: Secondary | ICD-10-CM | POA: Diagnosis not present

## 2016-11-08 DIAGNOSIS — E119 Type 2 diabetes mellitus without complications: Secondary | ICD-10-CM | POA: Diagnosis not present

## 2016-11-08 DIAGNOSIS — I2510 Atherosclerotic heart disease of native coronary artery without angina pectoris: Secondary | ICD-10-CM | POA: Diagnosis not present

## 2016-11-08 DIAGNOSIS — R778 Other specified abnormalities of plasma proteins: Secondary | ICD-10-CM

## 2016-11-08 DIAGNOSIS — R002 Palpitations: Secondary | ICD-10-CM | POA: Insufficient documentation

## 2016-11-08 DIAGNOSIS — I10 Essential (primary) hypertension: Secondary | ICD-10-CM | POA: Diagnosis not present

## 2016-11-08 DIAGNOSIS — R7989 Other specified abnormal findings of blood chemistry: Secondary | ICD-10-CM | POA: Diagnosis not present

## 2016-11-08 LAB — CBC WITH DIFFERENTIAL/PLATELET
BASOS PCT: 0 %
Basophils Absolute: 0 10*3/uL (ref 0.0–0.1)
EOS ABS: 0.2 10*3/uL (ref 0.0–0.7)
Eosinophils Relative: 3 %
HEMATOCRIT: 37.5 % (ref 36.0–46.0)
Hemoglobin: 12.4 g/dL (ref 12.0–15.0)
Lymphocytes Relative: 40 %
Lymphs Abs: 2.2 10*3/uL (ref 0.7–4.0)
MCH: 27.8 pg (ref 26.0–34.0)
MCHC: 33.1 g/dL (ref 30.0–36.0)
MCV: 84.1 fL (ref 78.0–100.0)
MONO ABS: 0.4 10*3/uL (ref 0.1–1.0)
MONOS PCT: 8 %
Neutro Abs: 2.7 10*3/uL (ref 1.7–7.7)
Neutrophils Relative %: 49 %
Platelets: 139 10*3/uL — ABNORMAL LOW (ref 150–400)
RBC: 4.46 MIL/uL (ref 3.87–5.11)
RDW: 16.1 % — AB (ref 11.5–15.5)
WBC: 5.5 10*3/uL (ref 4.0–10.5)

## 2016-11-08 LAB — COMPREHENSIVE METABOLIC PANEL
ALBUMIN: 3.9 g/dL (ref 3.5–5.0)
ALT: 22 U/L (ref 14–54)
ANION GAP: 11 (ref 5–15)
AST: 32 U/L (ref 15–41)
Alkaline Phosphatase: 69 U/L (ref 38–126)
BUN: 16 mg/dL (ref 6–20)
CO2: 22 mmol/L (ref 22–32)
Calcium: 9.4 mg/dL (ref 8.9–10.3)
Chloride: 108 mmol/L (ref 101–111)
Creatinine, Ser: 0.98 mg/dL (ref 0.44–1.00)
GFR calc non Af Amer: 55 mL/min — ABNORMAL LOW (ref >60)
GLUCOSE: 101 mg/dL — AB (ref 65–99)
POTASSIUM: 3.7 mmol/L (ref 3.5–5.1)
Sodium: 141 mmol/L (ref 135–145)
TOTAL PROTEIN: 7.2 g/dL (ref 6.5–8.1)
Total Bilirubin: 0.5 mg/dL (ref 0.3–1.2)

## 2016-11-08 LAB — TROPONIN I: TROPONIN I: 1.01 ng/mL — AB (ref <0.03)

## 2016-11-08 LAB — D-DIMER, QUANTITATIVE (NOT AT ARMC): D DIMER QUANT: 0.27 ug{FEU}/mL (ref 0.00–0.50)

## 2016-11-08 MED ORDER — ASPIRIN 81 MG PO CHEW: 324.0000 mg | CHEWABLE_TABLET | Freq: Every day | ORAL | 0 refills | Status: DC

## 2016-11-08 NOTE — ED Notes (Signed)
CRITICAL VALUE ALERT  Critical value received:  Troponin 1.01  Date of notification:  11/08/16  Time of notification:  0502  Critical value read back:Yes.    Nurse who received alert:  Lucy Antigua, RN  Responding MD:  Dr. Dina Rich  Time MD responded:  [phone]

## 2016-11-08 NOTE — ED Triage Notes (Addendum)
Patient states that she woke up this morning with a rapid heartbeat.  Denies chest pain or shortness of breath.  Patient states that she took 3 baby aspirin and a zofran at home around [place].

## 2016-11-08 NOTE — Discharge Instructions (Signed)
You were seen today for palpitations. You have no evidence of arrhythmia in the ED but there is some concern that he may be having an arrhythmia while at home. Your lab workup is reassuring. You have recently had a cardiac catheterization. You need to follow-up with cardiology for possible Holter monitoring testing. See them on Tuesday as scheduled. If you have any recurrent or new symptoms prior to then you need to be reevaluated immediately.

## 2016-11-08 NOTE — ED Provider Notes (Signed)
[facility] DEPT Provider Note   CSN: [id number] Arrival date & time: 11/08/16  0401     History   Chief Complaint Chief Complaint  Patient presents with  . Tachycardia    HPI [name] is a 76 y.o. female.  HPI  This is a 76 year old female with history of coronary artery disease, aortic insufficiency, hypertension who presents with palpitations. Patient reports onset of symptoms prior to arrival. She states that she was in bed when she noted her heart rate was fast. She did not document her heart rate. She denies any chest pain or shortness of breath during this time. She states that it lasted for approximately 2 hours. Denies any lower extremity swelling or history of blood clots. She does report recent history of MI. Review of patient's chart reveals cardiac catheterization on January 19 which showed only mild stenosis of mid RCA and ost [place]. Patient was seen in the hospital last week for elevated troponins. Per cardiology consult note, this was thought to be potentially related to an arrhythmia. May be SVT. Given reassuring cardiac catheterization 1 month ago, no further cardiac workup was obtained. Patient due to follow up with cardiology on Tuesday. She currently is asymptomatic. Denies any pain at this time and feels at her baseline.  Past Medical History:  Diagnosis Date  . Anemia   . Aortic insufficiency    Moderate  . Aortic stenosis    Mild  . Back pain   . Carotid stenosis, bilateral   . Cirrhosis ([place])   . Coronary artery disease    Stent x 2 RCA 1995, cardiac catheterization 09/2016 showing only mild atherosclerosis  . DDD (degenerative disc disease), lumbar   . Essential hypertension   . GERD (gastroesophageal reflux disease)   . Hiatal hernia   . History of stroke   . Hypercholesteremia   . IBS (irritable bowel syndrome)   . Myocardial infarction 1995  . Non-alcoholic fatty liver disease   . OSA (obstructive sleep apnea)    Sleep study 2009  AHI  9.91/hr and during REM sleep 35.58/hr  . Recurrent UTI   . Type 2 diabetes mellitus ([place])   . Varices, esophageal [facility]     Patient Active Problem List   Diagnosis Date Noted  . NSTEMI (non-ST elevated myocardial infarction) ([place]) 11/02/2016  . Multifocal atrial tachycardia ([place]) 09/25/2016  . Elevated troponin 09/24/2016  . Hypokalemia 09/23/2016  . TIA (transient ischemic attack) 05/05/2016  . Intractable nausea and vomiting 10/15/2015  . Acute coronary syndrome ([place]) 06/01/2015  . Bronchitis 06/01/2015  . Thrombocytopenia ([place]) 06/01/2015  . CVA (cerebral infarction) 03/06/2014  . UTI (lower urinary tract infection)   . PSVT (paroxysmal supraventricular tachycardia), recent episode this week 12/09/2013  . Esophageal varices ([place]) 03/29/2013  . Hematemesis 03/29/2013  . Hepatic cirrhosis ([place]) 03/29/2013  . Presence of stent in right coronary artery 07/04/2011  . OSA (obstructive sleep apnea) 07/04/2011  . Aortic sclerosis 07/04/2011  . Aortic insufficiency 07/04/2011  . HTN (hypertension) 07/04/2011  . Carotid stenosis, bilateral 07/04/2011  . Chest pain at rest 07/03/2011  . CAD (coronary artery disease) 07/03/2011  . DM type 2 (diabetes mellitus, type 2) ([place]) 07/03/2011  . Hypercholesteremia 07/03/2011  . GERD (gastroesophageal reflux disease) 07/03/2011    Past Surgical History:  Procedure Laterality Date  . [place]  . APPENDECTOMY    . BACK SURGERY  1985  . CARDIAC CATHETERIZATION  [phone]   Stent to the proximal RCA after  MI   . CARDIAC CATHETERIZATION N/A 09/26/2016   Procedure: Left Heart Cath and Coronary Angiography;  Surgeon: Leonie Man, MD;  Location: [facility];  Service: Cardiovascular;  Laterality: N/A;  . CATARACT EXTRACTION W/PHACO Right 07/04/2014   Procedure: CATARACT EXTRACTION PHACO AND INTRAOCULAR LENS PLACEMENT (IOC);  Surgeon: Elta Guadeloupe T. Gershon Crane, MD;  Location: [facility];  Service: Ophthalmology;  Laterality:  Right;  CDE:13.13  . COLONOSCOPY    . DILATION AND CURETTAGE OF UTERUS     x2  . Epi Retinal Membrane Peel Left   . ERCP    . ESOPHAGEAL BANDING N/A 04/01/2013   Procedure: ESOPHAGEAL BANDING;  Surgeon: Rogene Houston, MD;  [facility];  Service: Endoscopy;  Laterality: N/A;  . ESOPHAGEAL BANDING N/A 05/24/2013   Procedure: ESOPHAGEAL BANDING;  Surgeon: Rogene Houston, MD;  [facility];  Service: Endoscopy;  Laterality: N/A;  . ESOPHAGEAL BANDING N/A 06/21/2014   Procedure: ESOPHAGEAL BANDING;  Surgeon: Rogene Houston, MD;  [facility];  Service: Endoscopy;  Laterality: N/A;  . ESOPHAGOGASTRODUODENOSCOPY N/A 04/01/2013   Procedure: ESOPHAGOGASTRODUODENOSCOPY (EGD);  Surgeon: Rogene Houston, MD;  [facility];  Service: Endoscopy;  Laterality: N/A;  230-rescheduled to 8:30am Ann notified pt  . ESOPHAGOGASTRODUODENOSCOPY N/A 05/24/2013   Procedure: ESOPHAGOGASTRODUODENOSCOPY (EGD);  Surgeon: Rogene Houston, MD;  [facility];  Service: Endoscopy;  Laterality: N/A;  730  . ESOPHAGOGASTRODUODENOSCOPY N/A 06/21/2014   Procedure: ESOPHAGOGASTRODUODENOSCOPY (EGD);  Surgeon: Rogene Houston, MD;  [facility];  Service: Endoscopy;  Laterality: N/A;  930-rescheduled 10/14 @ 1200 Ann to notify pt  . EYE SURGERY  08   cataract surgery of the left eye  . HARDWARE REMOVAL Right 01/17/2013   Procedure: REMOVAL OF HARDWARE AND EXCISION ULNAR STYLOID RIGHT WRIST;  Surgeon: Tennis Must, MD;  Location: [facility];  Service: Orthopedics;  Laterality: Right;  . MYRINGOTOMY  2012   both ears  . TONSILLECTOMY    . VAGINAL HYSTERECTOMY  1972  . WRIST SURGERY     rt wrist hardwear removal    OB History    No data available       Home Medications    Prior to Admission medications   Medication Sig Start Date End Date Taking? Authorizing Provider  acetaminophen (TYLENOL) 500 MG tablet Take 1 tablet (500 mg total) by  mouth every 6 (six) hours as needed for moderate pain. Patient taking differently: Take 1,000 mg by mouth every 6 (six) hours as needed for moderate pain.  09/27/16   Cheryln Manly, NP  aspirin 81 MG chewable tablet Chew 4 tablets (324 mg total) by mouth daily. 11/08/16   Merryl Hacker, MD  atorvastatin (LIPITOR) 40 MG tablet Take 40 mg by mouth at bedtime. Reported on 12/17/2015    Historical Provider, MD  carvedilol (COREG) 25 MG tablet Take 1 tablet (25 mg total) by mouth 2 (two) times daily. 11/04/16   Kathie Dike, MD  clopidogrel (PLAVIX) 75 MG tablet Take 1 tablet (75 mg total) by mouth daily. 11/05/16   Arnoldo Lenis, MD  isosorbide mononitrate (IMDUR) 30 MG 24 hr tablet Take 1 tablet by mouth daily. 04/20/16   Historical Provider, MD  lisinopril (PRINIVIL,ZESTRIL) 20 MG tablet Take 0.5 tablets (10 mg total) by mouth daily. 11/04/16   Kathie Dike, MD  omeprazole (PRILOSEC) 20 MG capsule take 1 capsule by mouth twice a day with  food 09/30/16   Rogene Houston, MD  ondansetron (ZOFRAN) 8 MG tablet Take 1 tablet (8 mg total) by mouth every 8 (eight) hours as needed for nausea or vomiting. 09/11/16   Butch Penny, NP  zolpidem (AMBIEN) 10 MG tablet Take 10 mg by mouth at bedtime.    Historical Provider, MD    Family History Family History  Problem Relation Age of Onset  . Diabetes Mother   . Heart failure Father   . Heart failure Maternal Aunt     Social History Social History  Substance Use Topics  . Smoking status: Former Smoker    Packs/day: 0.25    Years: 50.00    Types: Cigarettes    Quit date: 01/13/2012  . Smokeless tobacco: Never Used  . Alcohol use No     Allergies   Tape   Review of Systems Review of Systems  Constitutional: Negative for fever.  Respiratory: Negative for cough and chest tightness.   Cardiovascular: Positive for palpitations. Negative for chest pain and leg swelling.  Gastrointestinal: Positive for nausea. Negative for abdominal pain and  vomiting.  All other systems reviewed and are negative.    Physical Exam Updated Vital Signs BP 162/57   Pulse 96   Temp 98.1 F (36.7 C) (Oral)   Resp 17   Ht 5' 3"  (1.6 m)   Wt 144 lb (65.3 kg)   SpO2 97%   BMI 25.51 kg/m   Physical Exam  Constitutional: She is oriented to person, place, and time. She appears well-developed and well-nourished. No distress.  Elderly  HENT:  Head: Normocephalic and atraumatic.  Cardiovascular: Normal rate and regular rhythm.   Murmur heard. Pulmonary/Chest: Effort normal and breath sounds normal. No respiratory distress. She has no wheezes.  Abdominal: Soft. Bowel sounds are normal. There is no tenderness. There is no guarding.  Musculoskeletal: She exhibits no edema.  Neurological: She is alert and oriented to person, place, and time.  Skin: Skin is warm and dry.  Psychiatric: She has a normal mood and affect.  Nursing note and vitals reviewed.    ED Treatments / Results  Labs (all labs ordered are listed, but only abnormal results are displayed) Labs Reviewed  CBC WITH DIFFERENTIAL/PLATELET - Abnormal; Notable for the following:       Result Value   RDW 16.1 (*)    Platelets 139 (*)    All other components within normal limits  COMPREHENSIVE METABOLIC PANEL - Abnormal; Notable for the following:    Glucose, Bld 101 (*)    GFR calc non Af Amer 55 (*)    All other components within normal limits  TROPONIN I - Abnormal; Notable for the following:    Troponin I 1.01 (*)    All other components within normal limits  D-DIMER, QUANTITATIVE (NOT AT [facility])    EKG  EKG Interpretation  Date/Time:  Saturday November 08 2016 04:11:48 EST Ventricular Rate:  102 PR Interval:    QRS Duration: 95 QT Interval:  370 QTC Calculation: 482 R Axis:   -34 Text Interpretation:  Sinus tachycardia Left axis deviation Probable anteroseptal infarct, old Borderline ST depression, lateral leads Baseline wander in lead(s) V2 No significant change since  last tracing Confirmed by HORTON  MD, COURTNEY ([id number]) on 11/08/2016 4:56:31 AM       Radiology Dg Chest 2 View  Result Date: 11/08/2016 CLINICAL DATA:  Acute onset of heart palpitations. Initial encounter. EXAM: CHEST  2 VIEW COMPARISON:  Chest radiograph  performed 11/02/2016 FINDINGS: The lungs are well-aerated and clear. There is no evidence of focal opacification, pleural effusion or pneumothorax. The heart is normal in size; the mediastinal contour is within normal limits. No acute osseous abnormalities are seen. IMPRESSION: No acute cardiopulmonary process seen. Electronically Signed   By: Garald Balding M.D.   On: 11/08/2016 05:01    Procedures Procedures (including critical care time)  Medications Ordered in ED Medications - No data to display   Initial Impression / Assessment and Plan / ED Course  I have reviewed the triage vital signs and the nursing notes.  Pertinent labs & imaging results that were available during my care of the patient were reviewed by me and considered in my medical decision making (see chart for details).    Patient presents with palpitations. Recent admission for an elevated troponin which was monitored closely. Some questionable history of tachycardia and possible SVT per cardiology consult note. Recent catheterization 6 weeks ago with no significant lesions. She is nontoxic and currently at her baseline. Vital signs reassuring. EKG shows sinus tachycardia. No evidence of arrhythmia at this time. However, arrhythmia including atrial fibrillation would not be uncommon with her history. Her workup is reassuring. She does have an elevated troponin but it is down trending from her last troponin the hospital and she is without chest pain. Patient reassured. Will have patient take a full dose aspirin until follow-up with cardiology for stroke prophylaxis. Patient was given strict return precautions.  After history, exam, and medical workup I feel the patient has  been appropriately medically screened and is safe for discharge home. Pertinent diagnoses were discussed with the patient. Patient was given return precautions.   Final Clinical Impressions(s) / ED Diagnoses   Final diagnoses:  Palpitations  Elevated troponin    New Prescriptions New Prescriptions   ASPIRIN 81 MG CHEWABLE TABLET    Chew 4 tablets (324 mg total) by mouth daily.     Merryl Hacker, MD 11/08/16 [phone]

## 2016-11-10 ENCOUNTER — Telehealth (INDEPENDENT_AMBULATORY_CARE_PROVIDER_SITE_OTHER): Payer: Self-pay | Admitting: Internal Medicine

## 2016-11-10 DIAGNOSIS — R110 Nausea: Secondary | ICD-10-CM

## 2016-11-10 MED ORDER — ONDANSETRON 8 MG PO TBDP: 8.0000 mg | ORAL_TABLET | Freq: Three times a day (TID) | ORAL | 0 refills | Status: DC | PRN

## 2016-11-10 NOTE — Telephone Encounter (Signed)
Patient called and stated that she needs a refill on Zofran, she only has two pills left.  She'd like called to [facility] in [place].  [phone]

## 2016-11-10 NOTE — Telephone Encounter (Signed)
Rx for Zofran sent to her pharmacy

## 2016-11-11 ENCOUNTER — Encounter: Payer: Self-pay | Admitting: Adult Health

## 2016-11-11 ENCOUNTER — Ambulatory Visit (INDEPENDENT_AMBULATORY_CARE_PROVIDER_SITE_OTHER): Payer: Medicare Other | Admitting: Adult Health

## 2016-11-11 ENCOUNTER — Ambulatory Visit (INDEPENDENT_AMBULATORY_CARE_PROVIDER_SITE_OTHER): Payer: Medicare Other

## 2016-11-11 VITALS — BP 138/64 | HR 82 | Ht 63.0 in | Wt 142.0 lb

## 2016-11-11 DIAGNOSIS — R000 Tachycardia, unspecified: Secondary | ICD-10-CM

## 2016-11-11 DIAGNOSIS — I471 Supraventricular tachycardia: Secondary | ICD-10-CM

## 2016-11-11 DIAGNOSIS — I2510 Atherosclerotic heart disease of native coronary artery without angina pectoris: Secondary | ICD-10-CM | POA: Diagnosis not present

## 2016-11-11 DIAGNOSIS — I10 Essential (primary) hypertension: Secondary | ICD-10-CM

## 2016-11-11 MED ORDER — DILTIAZEM HCL 30 MG PO TABS: 30.0000 mg | ORAL_TABLET | Freq: Two times a day (BID) | ORAL | 6 refills | Status: DC

## 2016-11-11 NOTE — Patient Instructions (Signed)
Your physician recommends that you schedule a follow-up appointment in: 2 Weeks   Your physician has recommended you make the following change in your medication: Start Diltiazem (Cardizem) 30 mg Two Times Daily   Your physician has recommended that you wear an event monitor. Event monitors are medical devices that record the heart's electrical activity. Doctors most often [place] these monitors to diagnose arrhythmias. Arrhythmias are problems with the speed or rhythm of the heartbeat. The monitor is a small, portable device. You can wear one while you do your normal daily activities. This is usually used to diagnose what is causing palpitations/syncope (passing out).    If you need a refill on your cardiac medications before your next appointment, please call your pharmacy.  Thank you for choosing [facility]!

## 2016-11-11 NOTE — Progress Notes (Signed)
Areas, one with heart disease or, on the second Cardiology Office Note   Date:  11/11/2016   ID:  [name], DOB 10/16/1940, MRN [id number]  PCP:  Glo Herring., MD  Cardiologist: Cloria Spring, NP   Chief Complaint  Patient presents with  . Coronary Artery Disease      History of Present Illness: [name] is a 76 y.o. female who presents for ongoing assessment and management of coronary artery disease, who was admitted to the hospital with recurrent chest pain and abnormal troponin level. The patient does have a history of stent placement to the RCA in 1995, with follow-up cardiac catheterization in January 2018 showing only mild atherosclerosis. She was seen on consultation during recent hospitalization, and was found to have episodes of brief bursts of what looked to be consistent with SVT  Carvedilol was increased to 25 mg twice a day, for suppression of potential SVT and hopefully improvement in symptoms. Echocardiogram was completed  11/03/2016 Left ventricle: The cavity size was normal. Wall thickness was   increased in a pattern of mild LVH. Systolic function was   vigorous. The estimated ejection fraction was in the range of 65%   to 70%. Wall motion was normal; there were no regional wall   motion abnormalities. The study is not technically sufficient to   allow evaluation of LV diastolic function. - Aortic valve: Poorly visualized. Mildly calcified leaflets. There   was mild to moderate regurgitation. Mean gradient (S): 15 mm Hg.   Valve area (VTI): 1.83 cm^2. - Mitral valve: Moderately to severely calcified annulus. There was   trivial regurgitation. - Left atrium: The atrium was mildly dilated. - Tricuspid valve: There was trivial regurgitation. - Pulmonary arteries: Systolic pressure could not be accurately   estimated. - Pericardium, extracardiac: A small pericardial effusion was   identified anterior to the heart.  Unfortunately, the patient  was seen in the emergency room on 11/08/2016 with complaints of rapid heart rhythm. EKG revealed heart rate 102 bpm, left axis deviation. She was started on aspirin 81 mg and is here for follow-up.    Past Medical History:  Diagnosis Date  . Anemia   . Aortic insufficiency    Moderate  . Aortic stenosis    Mild  . Back pain   . Carotid stenosis, bilateral   . Cirrhosis ([place])   . Coronary artery disease    Stent x 2 RCA 1995, cardiac catheterization 09/2016 showing only mild atherosclerosis  . DDD (degenerative disc disease), lumbar   . Essential hypertension   . GERD (gastroesophageal reflux disease)   . Hiatal hernia   . History of stroke   . Hypercholesteremia   . IBS (irritable bowel syndrome)   . Myocardial infarction 1995  . Non-alcoholic fatty liver disease   . OSA (obstructive sleep apnea)    Sleep study 2009  AHI 9.91/hr and during REM sleep 35.58/hr  . Recurrent UTI   . Type 2 diabetes mellitus ([place])   . Varices, esophageal ([place])     Past Surgical History:  Procedure Laterality Date  . [place]  . APPENDECTOMY    . BACK SURGERY  1985  . CARDIAC CATHETERIZATION  [id number]   Stent to the proximal RCA after MI   . CARDIAC CATHETERIZATION N/A 09/26/2016   Procedure: Left Heart Cath and Coronary Angiography;  Surgeon: Leonie Man, MD;  Location: [facility];  Service: Cardiovascular;  Laterality: N/A;  . CATARACT EXTRACTION  W/PHACO Right 07/04/2014   Procedure: CATARACT EXTRACTION PHACO AND INTRAOCULAR LENS PLACEMENT (IOC);  Surgeon: Elta Guadeloupe T. Gershon Crane, MD;  Location[facility];  Service: Ophthalmology;  Laterality: Right;  CDE:13.13  . COLONOSCOPY    . DILATION AND CURETTAGE OF UTERUS     x2  . Epi Retinal Membrane Peel Left   . ERCP    . ESOPHAGEAL BANDING N/A 04/01/2013   Procedure: ESOPHAGEAL BANDING;  Surgeon: Rogene Houston, MD;  [facility];  Service: Endoscopy;  Laterality: N/A;  . ESOPHAGEAL BANDING N/A 05/24/2013    Procedure: ESOPHAGEAL BANDING;  Surgeon: Rogene Houston, MD;  [facility];  Service: Endoscopy;  Laterality: N/A;  . ESOPHAGEAL BANDING N/A 06/21/2014   Procedure: ESOPHAGEAL BANDING;  Surgeon: Rogene Houston, MD;  [facility];  Service: Endoscopy;  Laterality: N/A;  . ESOPHAGOGASTRODUODENOSCOPY N/A 04/01/2013   Procedure: ESOPHAGOGASTRODUODENOSCOPY (EGD);  Surgeon: Rogene Houston, MD;  [facility];  Service: Endoscopy;  Laterality: N/A;  230-rescheduled to 8:30am Ann notified pt  . ESOPHAGOGASTRODUODENOSCOPY N/A 05/24/2013   Procedure: ESOPHAGOGASTRODUODENOSCOPY (EGD);  Surgeon: Rogene Houston, MD;  [facility];  Service: Endoscopy;  Laterality: N/A;  730  . ESOPHAGOGASTRODUODENOSCOPY N/A 06/21/2014   Procedure: ESOPHAGOGASTRODUODENOSCOPY (EGD);  Surgeon: Rogene Houston, MD;  [facility];  Service: Endoscopy;  Laterality: N/A;  930-rescheduled 10/14 @ 1200 Ann to notify pt  . EYE SURGERY  08   cataract surgery of the left eye  . HARDWARE REMOVAL Right 01/17/2013   Procedure: REMOVAL OF HARDWARE AND EXCISION ULNAR STYLOID RIGHT WRIST;  Surgeon: Tennis Must, MD;  Location: [facility];  Service: Orthopedics;  Laterality: Right;  . MYRINGOTOMY  2012   both ears  . TONSILLECTOMY    . VAGINAL HYSTERECTOMY  1972  . WRIST SURGERY     rt wrist hardwear removal     Current Outpatient Prescriptions  Medication Sig Dispense Refill  . acetaminophen (TYLENOL) 500 MG tablet Take 1 tablet (500 mg total) by mouth every 6 (six) hours as needed for moderate pain. (Patient taking differently: Take 1,000 mg by mouth every 6 (six) hours as needed for moderate pain. ) 30 tablet 0  . aspirin 81 MG chewable tablet Chew 4 tablets (324 mg total) by mouth daily. 60 tablet 0  . atorvastatin (LIPITOR) 40 MG tablet Take 40 mg by mouth at bedtime. Reported on 12/17/2015    . carvedilol (COREG) 25 MG tablet Take 1 tablet (25 mg total) by mouth  2 (two) times daily. 60 tablet 1  . clopidogrel (PLAVIX) 75 MG tablet Take 1 tablet (75 mg total) by mouth daily. 30 tablet 2  . isosorbide mononitrate (IMDUR) 30 MG 24 hr tablet Take 1 tablet by mouth daily.    Marland Kitchen lisinopril (PRINIVIL,ZESTRIL) 20 MG tablet Take 0.5 tablets (10 mg total) by mouth daily. 30 tablet 6  . omeprazole (PRILOSEC) 20 MG capsule take 1 capsule by mouth twice a day with food 60 capsule 5  . ondansetron (ZOFRAN ODT) 8 MG disintegrating tablet Take 1 tablet (8 mg total) by mouth every 8 (eight) hours as needed for nausea or vomiting. 20 tablet 0  . ondansetron (ZOFRAN) 8 MG tablet Take 1 tablet (8 mg total) by mouth every 8 (eight) hours as needed for nausea or vomiting. 20 tablet 2  . zolpidem (AMBIEN) 10 MG tablet Take 10 mg by mouth at bedtime.    Marland Kitchen diltiazem (CARDIZEM)  30 MG tablet Take 1 tablet (30 mg total) by mouth 2 (two) times daily. 60 tablet 6   No current facility-administered medications for this visit.     Allergies:   Tape    Social History:  The patient  reports that she quit smoking about 4 years ago. Her smoking use included Cigarettes. She has a 12.50 pack-year smoking history. She has never used smokeless tobacco. She reports that she does not drink alcohol or use drugs.   Family History:  The patient's family history includes Diabetes in her mother; Heart failure in her father and maternal aunt.    ROS: All other systems are reviewed and negative. Unless otherwise mentioned in H&P    PHYSICAL EXAM: VS:  BP 138/64   Pulse 82   Ht 5' 3"  (1.6 m)   Wt 142 lb (64.4 kg)   SpO2 97%   BMI 25.15 kg/m  , BMI Body mass index is 25.15 kg/m. GEN: Well nourished, well developed, in no acute distress  HEENT: normal  Neck: no JVD, carotid bruits, or masses Cardiac: RRR, no murmurs, rubs, or gallops,no edema  Respiratory:  clear to auscultation bilaterally, normal work of breathing GI: soft, noI can wife and I d see his MS: no deformity or atrophy   Skin: warm and dry, no rash Neuro:  Strength and sensation are intact Psych: euthymic mood,   09/23/2016: TSH 0.503 11/04/2016: Magnesium 1.7 11/08/2016: ALT 22; BUN 16; Creatinine, Ser 0.98; Hemoglobin 12.4; Platelets 139; Potassium 3.7; Sodium 141    Lipid Panel    Component Value Date/Time   CHOL 148 11/03/2016 0526   CHOL 142 12/07/2013 1038   TRIG 103 11/03/2016 0526   TRIG 98 12/07/2013 1038   HDL 46 11/03/2016 0526   HDL 47 12/07/2013 1038   CHOLHDL 3.2 11/03/2016 0526   VLDL 21 11/03/2016 0526   LDLCALC 81 11/03/2016 0526   LDLCALC 75 12/07/2013 1038      Wt Readings from Last 3 Encounters:  11/11/16 142 lb (64.4 kg)  11/08/16 144 lb (65.3 kg)  11/02/16 144 lb 2.9 oz (65.4 kg)      Other studies Reviewed:  Echocardiogram 11/03/2016 Left ventricle: The cavity size was normal. Wall thickness was   increased in a pattern of mild LVH. Systolic function was   vigorous. The estimated ejection fraction was in the range of 65%   to 70%. Wall motion was normal; there were no regional wall   motion abnormalities. The study is not technically sufficient to   allow evaluation of LV diastolic function. - Aortic valve: Poorly visualized. Mildly calcified leaflets. There   was mild to moderate regurgitation. Mean gradient (S): 15 mm Hg.   Valve area (VTI): 1.83 cm^2. - Mitral valve: Moderately to severely calcified annulus. There was   trivial regurgitation. - Left atrium: The atrium was mildly dilated. - Tricuspid valve: There was trivial regurgitation. - Pulmonary arteries: Systolic pressure could not be accurately   estimated. - Pericardium, extracardiac: A small pericardial effusion was   identified anterior to the heart.  ASSESSMENT AND PLAN:  1. PSVT: Recently in the emergency room with tachycardia, the patient remains on carvedilol 25 mg twice a day. She continues to have breakthrough rapid heart rhythm sometimes waking her at night. I discussed this with Dr.  Harl Bowie. It is his recommendation that we add diltiazem 30 mg twice a day, to assist with heart rate control. We'll place a 7 day cardiac monitor, to evaluate recurrent PSVT  and heart rate. The patient states she is feeling this every day. Hopefully we'll be able to capture what she is describing.  2. Hypertension: Blood pressure was elevated on initial evaluation in the office, at 115/49, I rechecked it at 118/58. Will keep track of this with addition of diltiazem.  3. Coronary artery disease: Continue secondary prevention, with statin therapy, ACE inhibitor, and dual antiplatelet therapy. No plan ischemic testing at this time.   Current medicines are reviewed at length with the patient today.    Labs/ tests ordered today include:   Orders Placed This Encounter  Procedures  . Cardiac event monitor     Disposition:   FU with  2-3 weeks. Signed, Jory Sims, NP  11/11/2016 5:41 PM    [facility] [address] [place], [place] [address] Phone: [phone]; Fax: [phone]

## 2016-11-11 NOTE — Progress Notes (Signed)
Name: [name]    DOB: [birth year]  Age: 76 y.o.  MR#: [id number]       PCP:  Glo Herring., MD      Insurance: Payor: [employer] MEDICARE / Plan: [facility] MEDICARE / Product Type: *No Product type* /   CC:   No chief complaint on file.   VS Vitals:   11/11/16 1534  Weight: 142 lb (64.4 kg)  Height: 5' 3"  (1.6 m)    Weights Current Weight  11/11/16 142 lb (64.4 kg)  11/08/16 144 lb (65.3 kg)  11/02/16 144 lb 2.9 oz (65.4 kg)    Blood Pressure  BP Readings from Last 3 Encounters:  11/08/16 (!) 158/49  11/04/16 (!) 130/37  10/09/16 (!) 154/74     Admit date:  (Not on file) Last encounter with RMR:  11/05/2016   Allergy Tape  Current Outpatient Prescriptions  Medication Sig Dispense Refill  . acetaminophen (TYLENOL) 500 MG tablet Take 1 tablet (500 mg total) by mouth every 6 (six) hours as needed for moderate pain. (Patient taking differently: Take 1,000 mg by mouth every 6 (six) hours as needed for moderate pain. ) 30 tablet 0  . aspirin 81 MG chewable tablet Chew 4 tablets (324 mg total) by mouth daily. 60 tablet 0  . atorvastatin (LIPITOR) 40 MG tablet Take 40 mg by mouth at bedtime. Reported on 12/17/2015    . carvedilol (COREG) 25 MG tablet Take 1 tablet (25 mg total) by mouth 2 (two) times daily. 60 tablet 1  . clopidogrel (PLAVIX) 75 MG tablet Take 1 tablet (75 mg total) by mouth daily. 30 tablet 2  . isosorbide mononitrate (IMDUR) 30 MG 24 hr tablet Take 1 tablet by mouth daily.    Marland Kitchen lisinopril (PRINIVIL,ZESTRIL) 20 MG tablet Take 0.5 tablets (10 mg total) by mouth daily. 30 tablet 6  . omeprazole (PRILOSEC) 20 MG capsule take 1 capsule by mouth twice a day with food 60 capsule 5  . ondansetron (ZOFRAN ODT) 8 MG disintegrating tablet Take 1 tablet (8 mg total) by mouth every 8 (eight) hours as needed for nausea or vomiting. 20 tablet 0  . ondansetron (ZOFRAN) 8 MG tablet Take 1 tablet (8 mg total) by mouth every 8 (eight) hours as needed for nausea or vomiting.  20 tablet 2  . zolpidem (AMBIEN) 10 MG tablet Take 10 mg by mouth at bedtime.     No current facility-administered medications for this visit.     Discontinued Meds:   There are no discontinued medications.  Patient Active Problem List   Diagnosis Date Noted  . NSTEMI (non-ST elevated myocardial infarction) ([place]) 11/02/2016  . Multifocal atrial tachycardia ([place]) 09/25/2016  . Elevated troponin 09/24/2016  . Hypokalemia 09/23/2016  . TIA (transient ischemic attack) 05/05/2016  . Intractable nausea and vomiting 10/15/2015  . Acute coronary syndrome ([place]) 06/01/2015  . Bronchitis 06/01/2015  . Thrombocytopenia ([place]) 06/01/2015  . CVA (cerebral infarction) 03/06/2014  . UTI (lower urinary tract infection)   . PSVT (paroxysmal supraventricular tachycardia), recent episode this week 12/09/2013  . Esophageal varices ([place]) 03/29/2013  . Hematemesis 03/29/2013  . Hepatic cirrhosis ([place]) 03/29/2013  . Presence of stent in right coronary artery 07/04/2011  . OSA (obstructive sleep apnea) 07/04/2011  . Aortic sclerosis 07/04/2011  . Aortic insufficiency 07/04/2011  . HTN (hypertension) 07/04/2011  . Carotid stenosis, bilateral 07/04/2011  . Chest pain at rest 07/03/2011  . CAD (coronary artery disease) 07/03/2011  . DM type 2 (diabetes mellitus, type  2) ([place]) 07/03/2011  . Hypercholesteremia 07/03/2011  . GERD (gastroesophageal reflux disease) 07/03/2011    LABS    Component Value Date/Time   NA 141 11/08/2016 0424   NA 140 11/04/2016 0458   NA 139 11/03/2016 0526   K 3.7 11/08/2016 0424   K 4.0 11/04/2016 0458   K 3.2 (L) 11/03/2016 0526   CL 108 11/08/2016 0424   CL 109 11/04/2016 0458   CL 105 11/03/2016 0526   CO2 22 11/08/2016 0424   CO2 24 11/04/2016 0458   CO2 24 11/03/2016 0526   GLUCOSE 101 (H) 11/08/2016 0424   GLUCOSE 120 (H) 11/04/2016 0458   GLUCOSE 126 (H) 11/03/2016 0526   BUN 16 11/08/2016 0424   BUN 10 11/04/2016 0458   BUN 11 11/03/2016 0526    CREATININE 0.98 11/08/2016 0424   CREATININE 0.86 11/04/2016 0458   CREATININE 0.83 11/03/2016 0526   CREATININE 1.12 (H) 12/15/2013 1037   CALCIUM 9.4 11/08/2016 0424   CALCIUM 9.0 11/04/2016 0458   CALCIUM 9.1 11/03/2016 0526   GFRNONAA 55 (L) 11/08/2016 0424   GFRNONAA >60 11/04/2016 0458   GFRNONAA >60 11/03/2016 0526   GFRNONAA 50 (L) 12/15/2013 1037   GFRAA >60 11/08/2016 0424   GFRAA >60 11/04/2016 0458   GFRAA >60 11/03/2016 0526   GFRAA 57 (L) 12/15/2013 1037   CMP     Component Value Date/Time   NA 141 11/08/2016 0424   K 3.7 11/08/2016 0424   CL 108 11/08/2016 0424   CO2 22 11/08/2016 0424   GLUCOSE 101 (H) 11/08/2016 0424   BUN 16 11/08/2016 0424   CREATININE 0.98 11/08/2016 0424   CREATININE 1.12 (H) 12/15/2013 1037   CALCIUM 9.4 11/08/2016 0424   PROT 7.2 11/08/2016 0424   ALBUMIN 3.9 11/08/2016 0424   AST 32 11/08/2016 0424   ALT 22 11/08/2016 0424   ALKPHOS 69 11/08/2016 0424   BILITOT 0.5 11/08/2016 0424   GFRNONAA 55 (L) 11/08/2016 0424   GFRNONAA 50 (L) 12/15/2013 1037   GFRAA >60 11/08/2016 0424   GFRAA 57 (L) 12/15/2013 1037       Component Value Date/Time   WBC 5.5 11/08/2016 0424   WBC 8.7 11/03/2016 0526   WBC 5.9 11/02/2016 1131   HGB 12.4 11/08/2016 0424   HGB 12.8 11/03/2016 0526   HGB 12.8 11/02/2016 1131   HCT 37.5 11/08/2016 0424   HCT 38.9 11/03/2016 0526   HCT 39.1 11/02/2016 1131   MCV 84.1 11/08/2016 0424   MCV 84.4 11/03/2016 0526   MCV 84.1 11/02/2016 1131    Lipid Panel     Component Value Date/Time   CHOL 148 11/03/2016 0526   CHOL 142 12/07/2013 1038   TRIG 103 11/03/2016 0526   TRIG 98 12/07/2013 1038   HDL 46 11/03/2016 0526   HDL 47 12/07/2013 1038   CHOLHDL 3.2 11/03/2016 0526   VLDL 21 11/03/2016 0526   LDLCALC 81 11/03/2016 0526   LDLCALC 75 12/07/2013 1038    ABG    Component Value Date/Time   TCO2 25 05/05/2016 0846     Lab Results  Component Value Date   TSH 0.503 09/23/2016   BNP (last 3  results) No results for input(s): BNP in the last 8760 hours.  ProBNP (last 3 results) No results for input(s): PROBNP in the last 8760 hours.  Cardiac Panel (last 3 results) No results for input(s): CKTOTAL, CKMB, TROPONINI, RELINDX in the last 72 hours.  Iron/TIBC/Ferritin/ %Sat No results  found for: IRON, TIBC, FERRITIN, IRONPCTSAT   EKG Orders placed or performed during the hospital encounter of 11/08/16  . EKG 12-Lead  . EKG 12-Lead  . EKG     Prior Assessment and Plan Problem List as of 11/11/2016 Reviewed: 10/09/2016  1:51 PM by Jory Sims, NP     Cardiovascular and Mediastinum   CAD (coronary artery disease)   Last Assessment & Plan 12/09/2013 Office Visit Written 12/09/2013  5:35 PM by Isaiah Serge, NP    Hx of RCA stents 1995, last cath 2007.  Minimal CAD at that time.  Last nuc 2012 negative for ischemia.  With her risk factors will proceed with Lexiscan myoview.      Aortic sclerosis   Last Assessment & Plan 12/09/2013 Office Visit Written 12/09/2013  5:34 PM by Isaiah Serge, NP    stable      Aortic insufficiency   HTN (hypertension)   Last Assessment & Plan 12/09/2013 Office Visit Written 12/09/2013  5:37 PM by Isaiah Serge, NP    BP elevated, I increased her coreg to 12.5 mg BID, she will call if decreased BP        Carotid stenosis, bilateral   Last Assessment & Plan 12/09/2013 Office Visit Written 12/09/2013  5:39 PM by Isaiah Serge, NP    stable      Esophageal varices ([place])   PSVT (paroxysmal supraventricular tachycardia), recent episode this week   Last Assessment & Plan 12/09/2013 Office Visit Written 12/09/2013  5:33 PM by Isaiah Serge, NP    Episode of SVT rate of 142 this week.  Has not felt well since, + weakness, + teeth aching with the episode could be anginal equivalent.  Will schedule a lexiscan myoview for next week she requested it be done in [place].  She will follow up with Dr. Debara Pickett for results, though in the future she may request the  [place] office to be seen.        Acute coronary syndrome (HCC)   TIA (transient ischemic attack)   Multifocal atrial tachycardia (HCC)   NSTEMI (non-ST elevated myocardial infarction) (HCC)     Respiratory   OSA (obstructive sleep apnea)   Bronchitis     Digestive   GERD (gastroesophageal reflux disease)   Hematemesis   Hepatic cirrhosis [facility])   Last Assessment & Plan 12/09/2013 Office Visit Written 12/09/2013  5:39 PM by Isaiah Serge, NP    stable        Endocrine   DM type 2 (diabetes mellitus, type 2) [facility])   Last Assessment & Plan 12/09/2013 Office Visit Written 12/09/2013  5:38 PM by Isaiah Serge, NP    Followed by PCP        Nervous and Auditory   CVA (cerebral infarction)     Genitourinary   UTI (lower urinary tract infection)     Other   Hypercholesteremia   Last Assessment & Plan 12/09/2013 Office Visit Written 12/09/2013  5:38 PM by Isaiah Serge, NP    Improved with recent labs.      Presence of stent in right coronary artery   Chest pain at rest   Thrombocytopenia (HCC)   Intractable nausea and vomiting   Hypokalemia   Elevated troponin       Imaging: Dg Chest 2 View  Result Date: 11/08/2016 CLINICAL DATA:  Acute onset of heart palpitations. Initial encounter. EXAM: CHEST  2 VIEW COMPARISON:  Chest radiograph performed 11/02/2016 FINDINGS: The  lungs are well-aerated and clear. There is no evidence of focal opacification, pleural effusion or pneumothorax. The heart is normal in size; the mediastinal contour is within normal limits. No acute osseous abnormalities are seen. IMPRESSION: No acute cardiopulmonary process seen. Electronically Signed   By: Garald Balding M.D.   On: 11/08/2016 05:01   Dg Chest 2 View  Result Date: 11/02/2016 CLINICAL DATA:  CHEST PAIN, SMOKER, Smoker, with N/V last night, this am CP to L Chest HISTORY OF DM, HTN, MI, STROKE, CAD, GERD EXAM: CHEST  2 VIEW COMPARISON:  09/23/2016 FINDINGS: Cardiac silhouette is normal in size and  configuration. No mediastinal or hilar masses. No evidence of adenopathy. Lungs are mildly hyperexpanded but clear. No pleural effusion.  No pneumothorax. Skeletal structures are demineralized but intact. IMPRESSION: No active cardiopulmonary disease. Electronically Signed   By: Lajean Manes M.D.   On: 11/02/2016 12:33

## 2016-11-12 DIAGNOSIS — R000 Tachycardia, unspecified: Secondary | ICD-10-CM | POA: Diagnosis not present

## 2016-11-13 ENCOUNTER — Telehealth: Payer: Self-pay | Admitting: Adult Health

## 2016-11-13 NOTE — Telephone Encounter (Signed)
Patient stopped diltiazem last night but took it today because her heart was beating fast. So far, no diarrhea, she will call back for update

## 2016-11-13 NOTE — Telephone Encounter (Signed)
Pt feels better, no tachycardia, no diarrhea,will call back if issuses arise

## 2016-11-13 NOTE — Telephone Encounter (Signed)
Would keep trying to take dilitazem and monitor her symptoms. Diarrhea only happens from the medicine in about 1-2% of people, unclear if its related, her body may also get used to it. If continued diarrhea then yes may have to stop medicine   J Sharanya Templin MD

## 2016-11-13 NOTE — Telephone Encounter (Signed)
Per pt phone call--she cannot take the diltiazem (CARDIZEM) 30 MG tablet [[id number]] b/c it's giving her diarrhea. Please give her a call

## 2016-11-13 NOTE — Telephone Encounter (Signed)
Started on CCB 2 days ago, will forward to provider

## 2016-11-20 DIAGNOSIS — I471 Supraventricular tachycardia: Secondary | ICD-10-CM | POA: Diagnosis not present

## 2016-11-20 DIAGNOSIS — Z1389 Encounter for screening for other disorder: Secondary | ICD-10-CM | POA: Diagnosis not present

## 2016-11-20 DIAGNOSIS — E119 Type 2 diabetes mellitus without complications: Secondary | ICD-10-CM | POA: Diagnosis not present

## 2016-11-20 DIAGNOSIS — I10 Essential (primary) hypertension: Secondary | ICD-10-CM | POA: Diagnosis not present

## 2016-11-20 DIAGNOSIS — K219 Gastro-esophageal reflux disease without esophagitis: Secondary | ICD-10-CM | POA: Diagnosis not present

## 2016-11-24 ENCOUNTER — Encounter: Payer: Self-pay | Admitting: Physician Assistant

## 2016-11-24 NOTE — Progress Notes (Deleted)
Cardiology Office Note    Date:  11/24/2016  ID:  [name], DOB 01-08-1941, MRN [id number] PCP:  Glo Herring., MD  Cardiologist:  ***   Chief Complaint: ***  History of Present Illness:  [name] is a 76 y.o. female with history of CAD (inferior MI s/p stent to RCA in 1995, stable cath 09/2016), aortic insufficiency, anemia, carotid artery disease, cirrhosis, HTN, GERD, history of stroke, hyperlipidemia, OSA, NASH, DM, esophageal varices, small pericardial effusion, recently diagnosed PSVT who presents for follow-up. She was evaluated in January due to chest pain and abnormal troponin I levels resulting in follow-up catheterization which showed essentially minimal coronary disease and patent RCA stent. CTA was negative for PE. She was readmitted 10/2016 again with chest pain and elevated troponin (1.3) and found to have paroxysmal SVT as well as sinus tachycardia. Carvedilol was increased. 2D echo 11/03/16: EF 65-70%, no RWMA, mild-mod AI, mod-severely calcified MV annulus, mild LAE, small pericardial effusion. Last labs 11/08/16: Cr 0.98, LFTs wnl, K 3.7, troponin 1.01, d-dimer negative, Hgb 12.4, plt 139; prior Mg 10/2016 was 1.7, prior TSH in 09/2016 was 0.503. She was again seen in the ER for similar symptoms with troponin of 1.0. She saw Jory Sims 11/11/16 at which time she was reporting continued episodes, prompting addition of diltiazem 104m BID. 7 day event monitor showed NSR with a PAC. Last carotid duplex 04/2016: L 50-69%, R <50%.  Mg level    Past Medical History:  Diagnosis Date  . Anemia   . Aortic insufficiency    Moderate  . Back pain   . Carotid stenosis, bilateral   . [place]rhosis ([place]   . Coronary artery disease    Stent x 2 RCA 1995, cardiac catheterization 09/2016 showing only mild atherosclerosis  . DDD (degenerative disc disease), lumbar   . Essential hypertension   . GERD (gastroesophageal reflux disease)   . Hiatal hernia   . History of stroke   .  Hypercholesteremia   . IBS (irritable bowel syndrome)   . Myocardial infarction 1995  . Non-alcoholic fatty liver disease   . OSA (obstructive sleep apnea)    Sleep study 2009  AHI 9.91/hr and during REM sleep 35.58/hr  . Pericardial effusion    a. small by echo 2018.  .Marland KitchenPSVT (paroxysmal supraventricular tachycardia) ([place]   . Recurrent UTI   . Type 2 diabetes mellitus ([place]   . Varices, esophageal ([place]     Past Surgical History:  Procedure Laterality Date  . [place] . [place]    . BACK SURGERY  1985  . CARDIAC CATHETERIZATION  [phone]  Stent to the proximal RCA after MI   . CARDIAC CATHETERIZATION N/A 09/26/2016   Procedure: Left Heart Cath and Coronary Angiography;  Surgeon: DLeonie Man MD;  [facility];  Service: Cardiovascular;  Laterality: N/A;  . CATARACT EXTRACTION W/PHACO Right 07/04/2014   Procedure: CATARACT EXTRACTION PHACO AND INTRAOCULAR LENS PLACEMENT (IOC);  Surgeon: MElta GuadeloupeT. SGershon Crane MD;  Location: [facility];  Service: Ophthalmology;  Laterality: Right;  CDE:13.13  . COLONOSCOPY    . DILATION AND CURETTAGE OF UTERUS     x2  . Epi Retinal Membrane Peel Left   . ERCP    . ESOPHAGEAL BANDING N/A 04/01/2013   Procedure: ESOPHAGEAL BANDING;  Surgeon: NRogene Houston MD;  Location: [facility] END[facility] SUITE;  Service: Endoscopy;  Laterality: N/A;  . ESOPHAGEAL BANDING N/A 05/24/2013   Procedure: ESOPHAGEAL BANDING;  Surgeon: Rogene Houston, MD;  Location[facility];  Service: Endoscopy;  Laterality: N/A;  . ESOPHAGEAL BANDING N/A 06/21/2014   Procedure: ESOPHAGEAL BANDING;  Surgeon: Rogene Houston, MD;  [facility];  Service: Endoscopy;  Laterality: N/A;  . ESOPHAGOGASTRODUODENOSCOPY N/A 04/01/2013   Procedure: ESOPHAGOGASTRODUODENOSCOPY (EGD);  Surgeon: Rogene Houston, MD;  [facility];  Service: Endoscopy;  Laterality: N/A;  230-rescheduled to 8:30am Ann notified pt  . ESOPHAGOGASTRODUODENOSCOPY N/A  05/24/2013   Procedure: ESOPHAGOGASTRODUODENOSCOPY (EGD);  Surgeon: Rogene Houston, MD;  [facility];  Service: Endoscopy;  Laterality: N/A;  730  . ESOPHAGOGASTRODUODENOSCOPY N/A 06/21/2014   Procedure: ESOPHAGOGASTRODUODENOSCOPY (EGD);  Surgeon: Rogene Houston, MD;  [facility];  Service: Endoscopy;  Laterality: N/A;  930-rescheduled 10/14 @ 1200 Ann to notify pt  . EYE SURGERY  08   cataract surgery of the left eye  . HARDWARE REMOVAL Right 01/17/2013   Procedure: REMOVAL OF HARDWARE AND EXCISION ULNAR STYLOID RIGHT WRIST;  Surgeon: Tennis Must, MD;  Location: [place];  Service: Orthopedics;  Laterality: Right;  . MYRINGOTOMY  2012   both ears  . TONSILLECTOMY    . VAGINAL HYSTERECTOMY  1972  . WRIST SURGERY     rt wrist hardwear removal    Current Medications: Current Outpatient Prescriptions  Medication Sig Dispense Refill  . acetaminophen (TYLENOL) 500 MG tablet Take 1 tablet (500 mg total) by mouth every 6 (six) hours as needed for moderate pain. (Patient taking differently: Take 1,000 mg by mouth every 6 (six) hours as needed for moderate pain. ) 30 tablet 0  . aspirin 81 MG chewable tablet Chew 4 tablets (324 mg total) by mouth daily. 60 tablet 0  . atorvastatin (LIPITOR) 40 MG tablet Take 40 mg by mouth at bedtime. Reported on 12/17/2015    . carvedilol (COREG) 25 MG tablet Take 1 tablet (25 mg total) by mouth 2 (two) times daily. 60 tablet 1  . clopidogrel (PLAVIX) 75 MG tablet Take 1 tablet (75 mg total) by mouth daily. 30 tablet 2  . diltiazem (CARDIZEM) 30 MG tablet Take 1 tablet (30 mg total) by mouth 2 (two) times daily. 60 tablet 6  . isosorbide mononitrate (IMDUR) 30 MG 24 hr tablet Take 1 tablet by mouth daily.    Marland Kitchen lisinopril (PRINIVIL,ZESTRIL) 20 MG tablet Take 0.5 tablets (10 mg total) by mouth daily. 30 tablet 6  . omeprazole (PRILOSEC) 20 MG capsule take 1 capsule by mouth twice a day with food 60 capsule 5  . ondansetron  (ZOFRAN ODT) 8 MG disintegrating tablet Take 1 tablet (8 mg total) by mouth every 8 (eight) hours as needed for nausea or vomiting. 20 tablet 0  . ondansetron (ZOFRAN) 8 MG tablet Take 1 tablet (8 mg total) by mouth every 8 (eight) hours as needed for nausea or vomiting. 20 tablet 2  . zolpidem (AMBIEN) 10 MG tablet Take 10 mg by mouth at bedtime.     No current facility-administered medications for this visit.      Allergies:   Tape   Social History   Social History  . Marital status: Widowed    Spouse name: N/A  . Number of children: 2  . Years of education: N/A   Occupational History  .  Retired   Social History Main Topics  . Smoking status: Former Smoker    Packs/day: 0.25    Years: 50.00    Types:  Cigarettes    Quit date: 01/13/2012  . Smokeless tobacco: Never Used  . Alcohol use No  . Drug use: No  . Sexual activity: Yes    Birth control/ protection: Surgical   Other Topics Concern  . Not on file   Social History Narrative  . No narrative on file     Family History:  Family History  Problem Relation Age of Onset  . Diabetes Mother   . Heart failure Father   . Heart failure Maternal Aunt    ***  ROS:   Please see the history of present illness. Otherwise, review of systems is positive for ***.  All other systems are reviewed and otherwise negative.    PHYSICAL EXAM:   VS:  There were no vitals taken for this visit.  BMI: There is no height or weight on file to calculate BMI. GEN: Well nourished, well developed, in no acute distress HEENT: normocephalic, atraumatic Neck: no JVD, carotid bruits, or masses Cardiac: ***RRR; no murmurs, rubs, or gallops, no edema  Respiratory:  clear to auscultation bilaterally, normal work of breathing GI: soft, nontender, nondistended, + BS MS: no deformity or atrophy Skin: warm and dry, no rash Neuro:  Alert and Oriented x 3, Strength and sensation are intact, follows commands Psych: euthymic mood, full affect  Wt  Readings from Last 3 Encounters:  11/11/16 142 lb (64.4 kg)  11/08/16 144 lb (65.3 kg)  11/02/16 144 lb 2.9 oz (65.4 kg)      Studies/Labs Reviewed:   EKG:  EKG was ordered today and personally reviewed by me and demonstrates *** EKG was not ordered today.***  Recent Labs: 09/23/2016: TSH 0.503 11/04/2016: Magnesium 1.7 11/08/2016: ALT 22; BUN 16; Creatinine, Ser 0.98; Hemoglobin 12.4; Platelets 139; Potassium 3.7; Sodium 141   Lipid Panel    Component Value Date/Time   CHOL 148 11/03/2016 0526   CHOL 142 12/07/2013 1038   TRIG 103 11/03/2016 0526   TRIG 98 12/07/2013 1038   HDL 46 11/03/2016 0526   HDL 47 12/07/2013 1038   CHOLHDL 3.2 11/03/2016 0526   VLDL 21 11/03/2016 0526   LDLCALC 81 11/03/2016 0526   LDLCALC 75 12/07/2013 1038    Additional studies/ records that were reviewed today include: Summarized above.***    ASSESSMENT & PLAN:   1. PSVT 2. Essential HTN 3. CAD in native artery 4. Carotid artery disease  Disposition: F/u with ***   Medication Adjustments/Labs and Tests Ordered: Current medicines are reviewed at length with the patient today.  Concerns regarding medicines are outlined above. Medication changes, Labs and Tests ordered today are summarized above and listed in the Patient Instructions accessible in Encounters.   Raechel Ache PA-C  11/24/2016 4:36 PM    [facility] [address], [place], [place]  [address] Phone: [phone]; Fax: [phone]

## 2016-11-25 ENCOUNTER — Ambulatory Visit: Payer: Self-pay | Admitting: Physician Assistant

## 2016-11-26 IMAGING — MR MR HEAD W/O CM
8 of 10 series · 34 of 48 positions shown · non-contrast
Comparison: MRI of brain and MRA head 03/06/2014. CT head
05/05/2016. Concurrent MRA of head.

CLINICAL DATA: 74 y/o F; slurred speech and left-sided weakness for
1 day.

EXAM:
MRI HEAD WITHOUT CONTRAST
TECHNIQUE: Multiplanar, multiecho pulse sequences of the brain and surrounding
structures were obtained without intravenous contrast.

[Series 4: [id number] · sagittal · 5.0mm · 0.42mm/px · 2 of 20 slices shown]
[im 1/20]
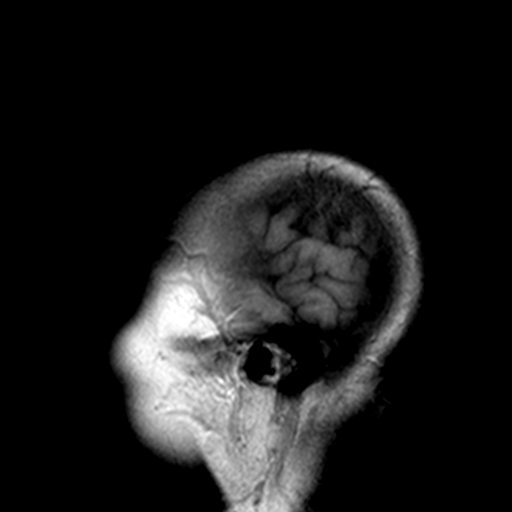
[im 20/20]
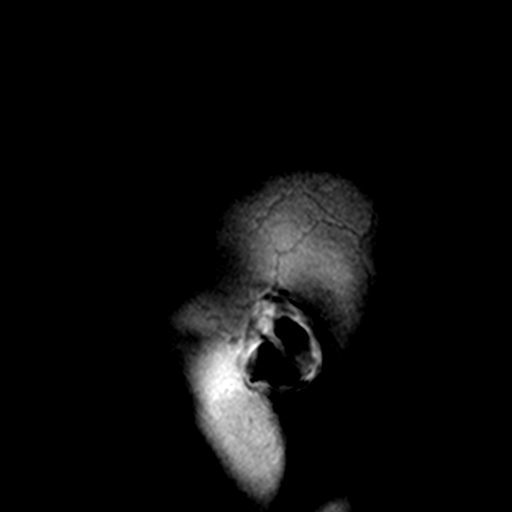

[Series 7: T2 · axial · 5.0mm · 0.75mm/px · z∈[-95,+48]mm · 3 of 23 slices shown (1 of 2)]
[im 1/23]
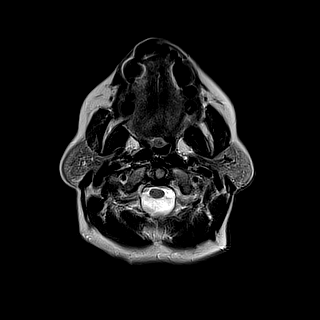
[im 12/23]
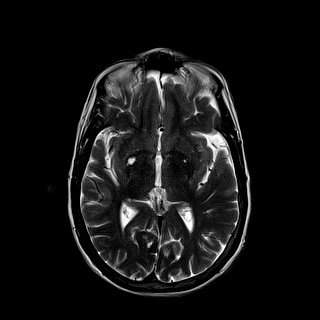
[im 23/23]
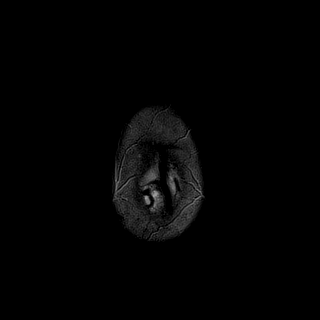

[Series 8: FLAIR · axial · 5.0mm · 0.94mm/px · z∈[-95,+48]mm · 3 of 23 slices shown]
[im 1/23]
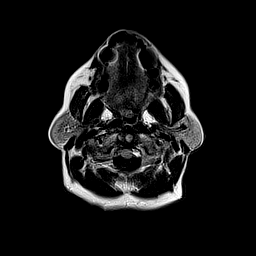
[im 12/23]
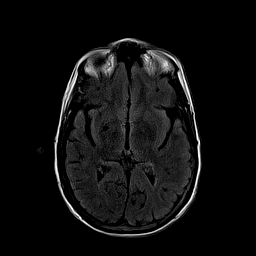
[im 23/23]
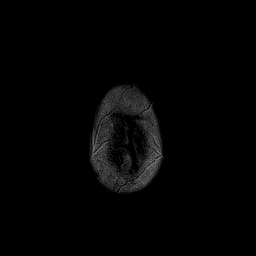

[Series 9: T1 · axial · 2.0mm · 0.47mm/px · z∈[-109,+79]mm · 11 of 95 slices shown]
[im 1/95]
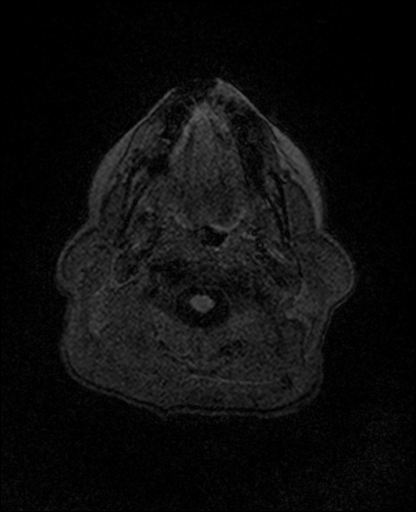
[im 10/95]
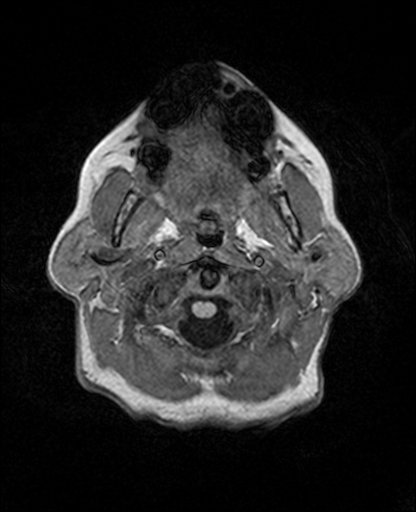
[im 19/95]
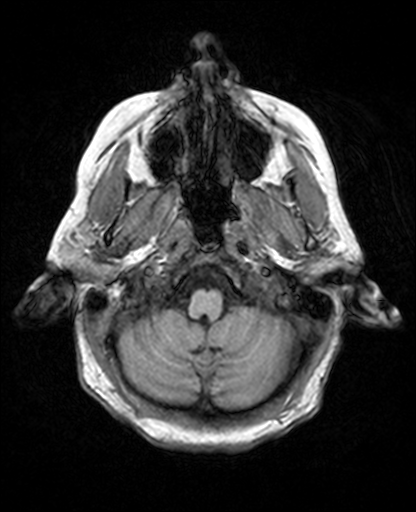
[im 29/95]
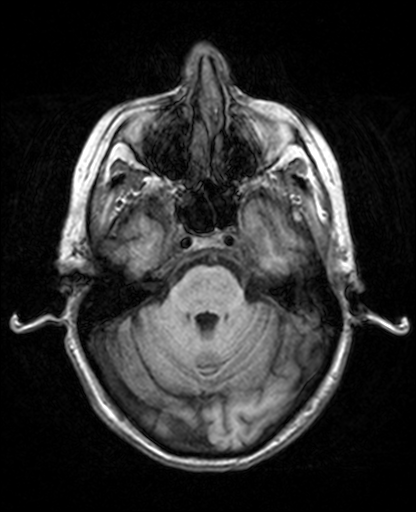
[im 38/95]
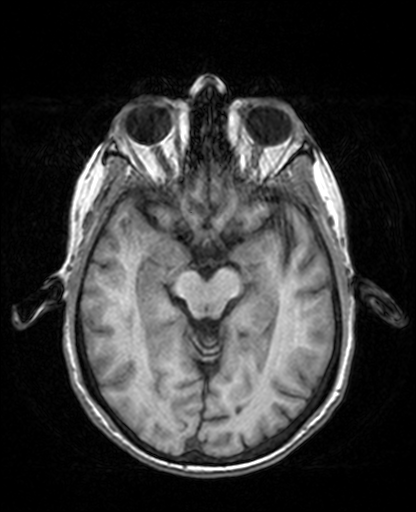
[im 48/95]
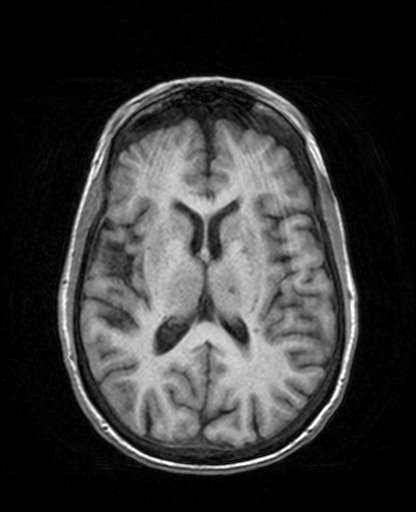
[im 57/95]
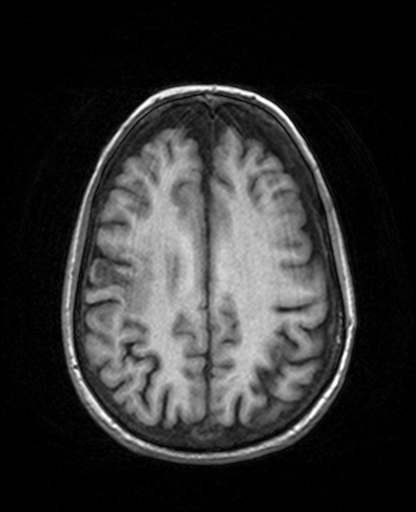
[im 66/95]
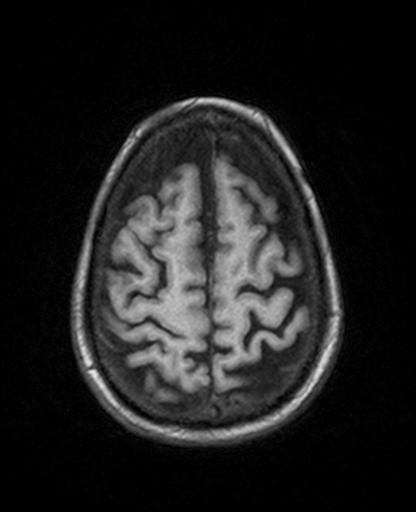
[im 76/95]
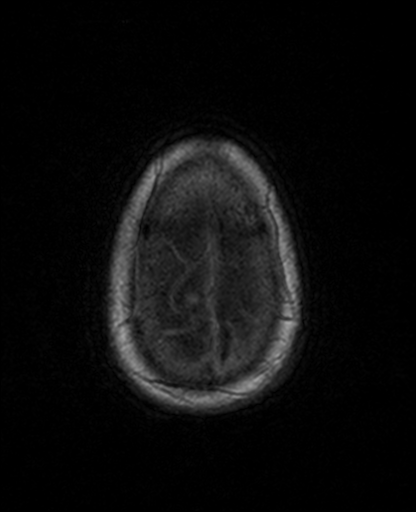
[im 85/95]
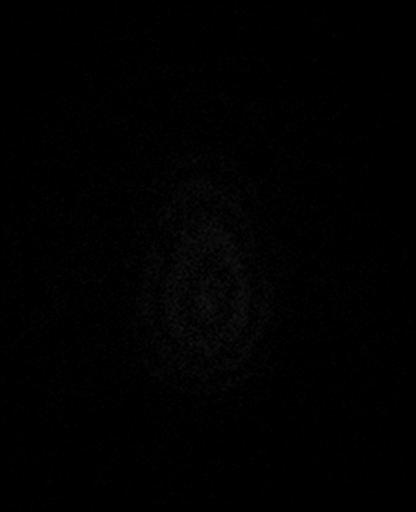
[im 95/95]
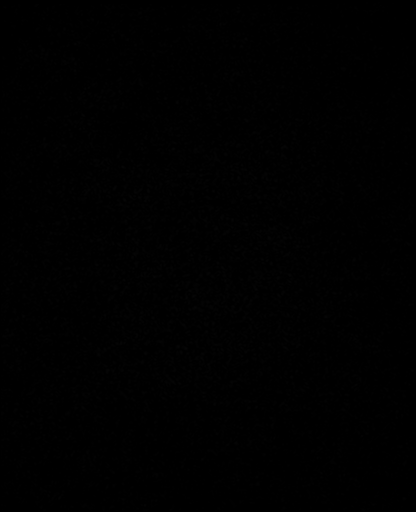

[Series 10: trauma axial · axial · 5.0mm · 0.45mm/px · z∈[-88,+41]mm · 2 of 21 slices shown]
[im 1/21]
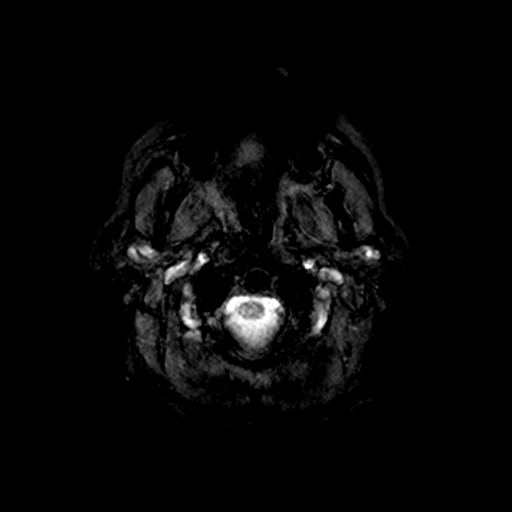
[im 21/21]
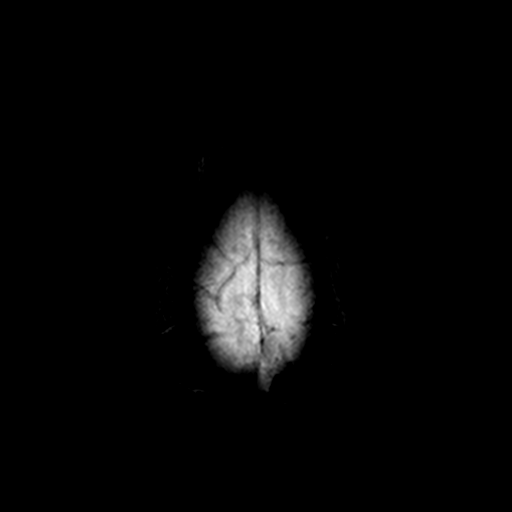

[Series 11: T2 · coronal · 5.0mm · 0.62mm/px · 3 of 26 slices shown (2 of 2)]
[im 1/26]
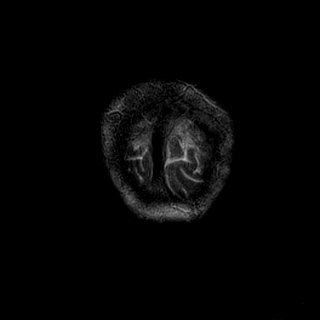
[im 13/26]
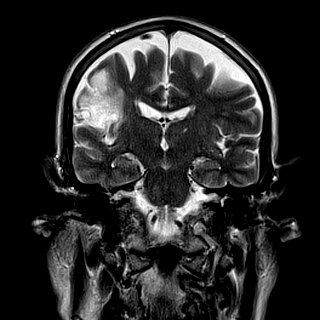
[im 26/26]
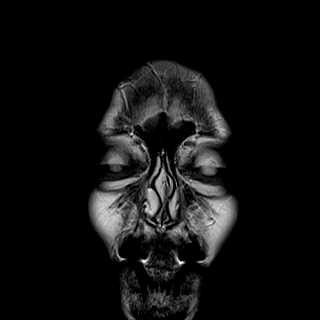

[Series 100: <mpr thick range> · axial · 3.0mm · 0.82mm/px · z∈[-91,+49]mm · 6 of 47 slices shown]
[im 1/47]
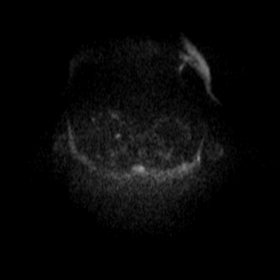
[im 10/47]
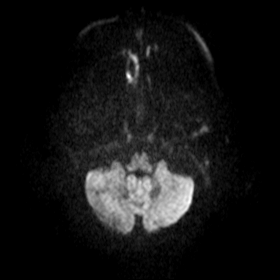
[im 19/47]
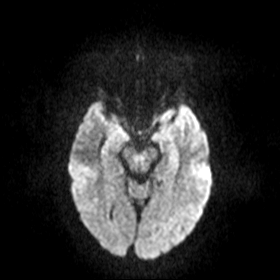
[im 28/47]
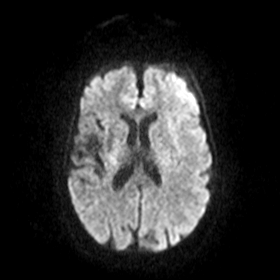
[im 37/47]
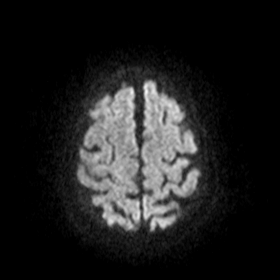
[im 47/47]
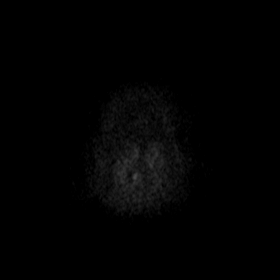

[Series 101: <mpr thick range(1)> · coronal · 3.0mm · 0.82mm/px · 4 of 52 slices shown]
[im 1/52]
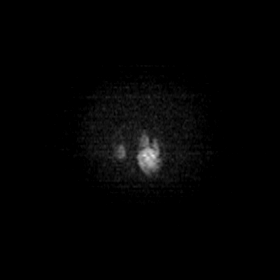
[im 11/52]
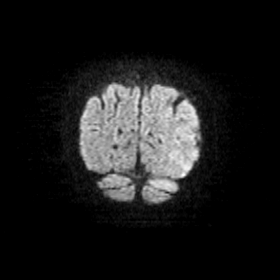
[im 21/52]
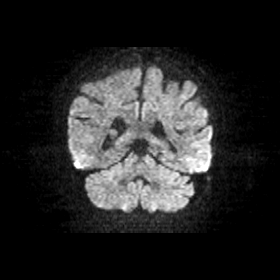
[im 31/52]
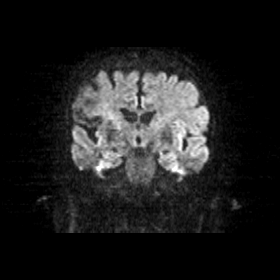

[34 of 48 positions shown; findings below may reference images not displayed]

FINDINGS: Brain: No diffusion restriction to suggest acute infarct. No
abnormal susceptibility hypointensity to indicate intracranial
hemorrhage. Small lacunar infarcts in the right cerebellar
hemisphere. Probable lacunar infarcts versus prominent perivascular
spaces of the right lentiform nucleus, left thalamus, and left
lentiform nucleus. Right frontal operculum encephalomalacia
compatible with prior infarct seen on prior MRI.

Extra-axial space: No hydrocephalus. No extra-axial collection is
identified.

Other: No abnormal signal of the paranasal sinuses.
Left-greater-than-right mild opacification of mastoid air cells.
Bilateral intra-ocular lens replacement Calvarium is unremarkable.
IMPRESSION: 1. No acute intracranial abnormality is identified.
2. Chronic right frontal operculum infarct and small lacunar
infarcts.

By: Quirijn Amazigh M.D.

## 2016-11-26 IMAGING — CT CT HEAD W/O CM
3 of 4 series · 15 of 47 positions shown, 18 images · non-contrast
Comparison: 05/12/2014 CT

CLINICAL DATA: 74-year-old female with acute slurred speech and
left facial droop today.

EXAM:
CT HEAD WITHOUT CONTRAST
TECHNIQUE: Contiguous axial images were obtained from the base of the skull
through the vertex without intravenous contrast.

[Series 2: head w/o · axial · non-contrast · 0.44mm/px · z∈[-9,+131]mm · 9 of 41 slices shown, 12 images]
[im 3/41  brain]
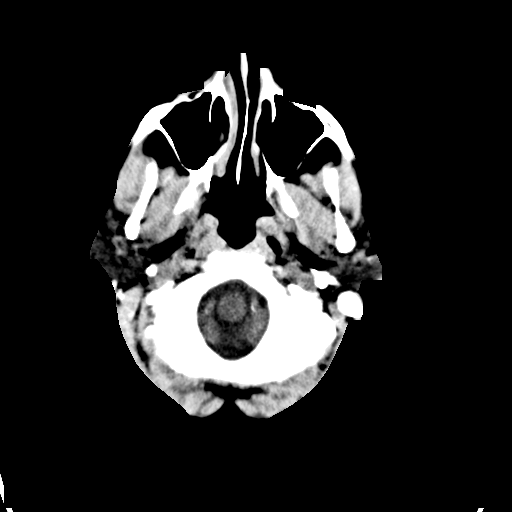
[im 3/41  bone]
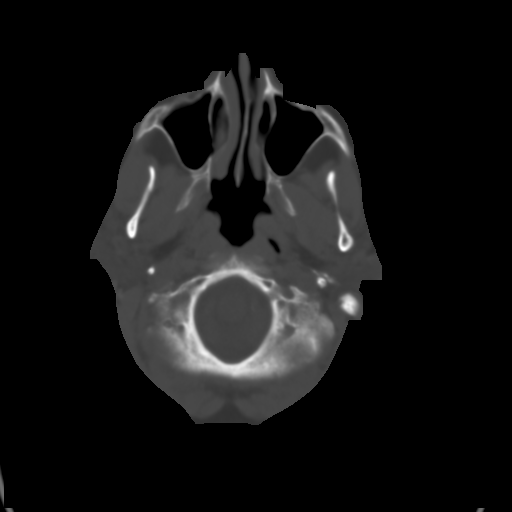
[im 9/41  brain]
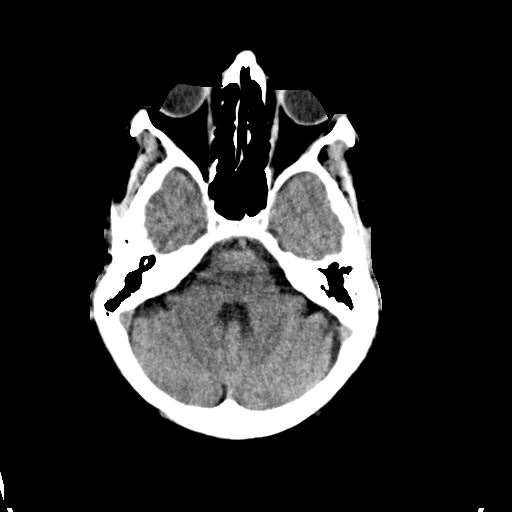
[im 12/41  brain]
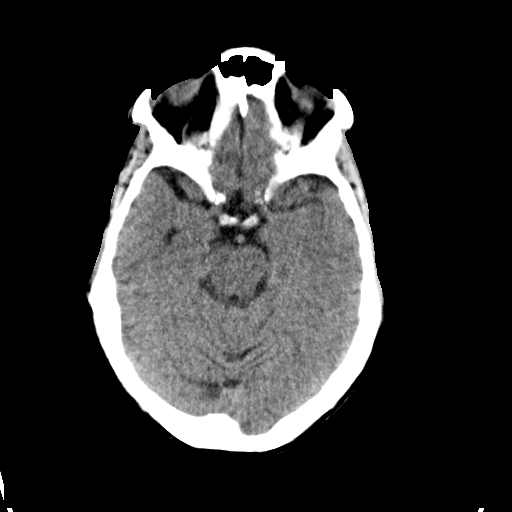
[im 18/41  brain]
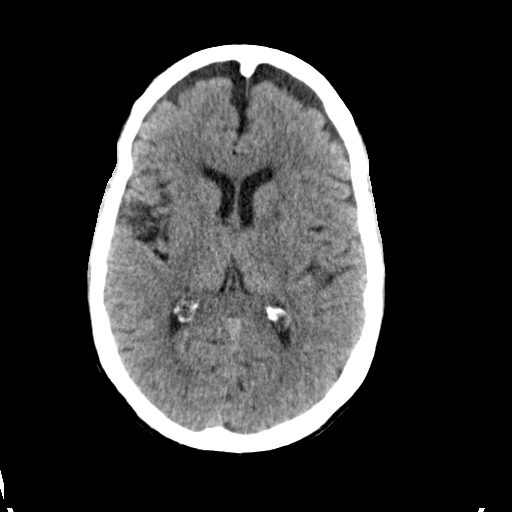
[im 21/41  brain]
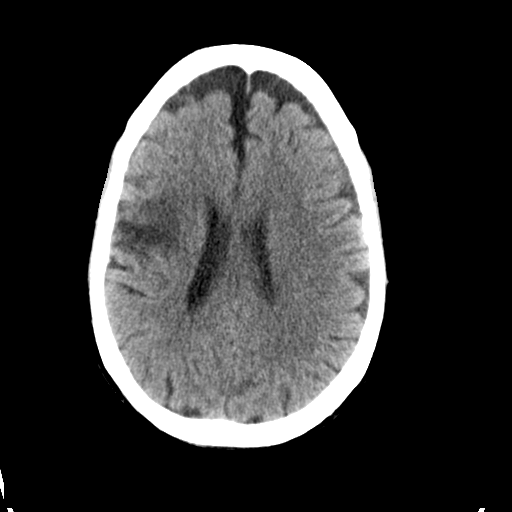
[im 21/41  bone]
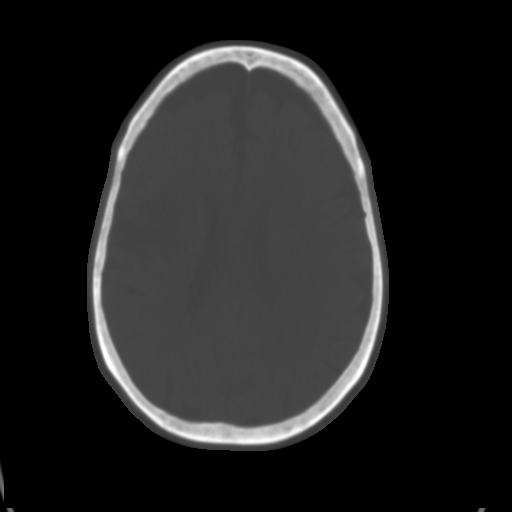
[im 23/41  brain]
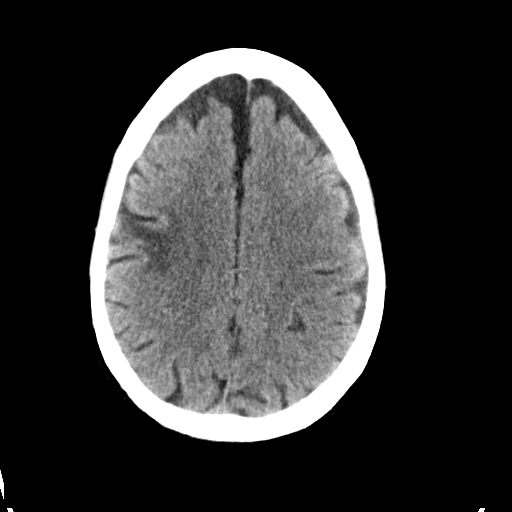
[im 29/41  brain]
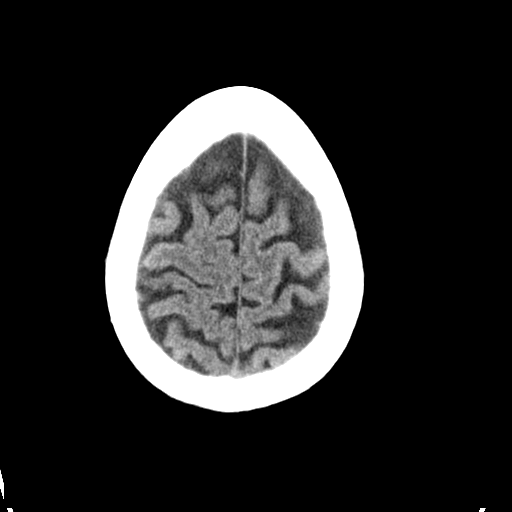
[im 32/41  brain]
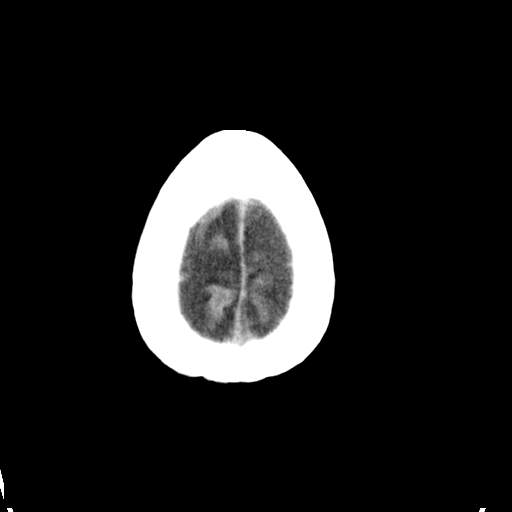
[im 38/41  brain]
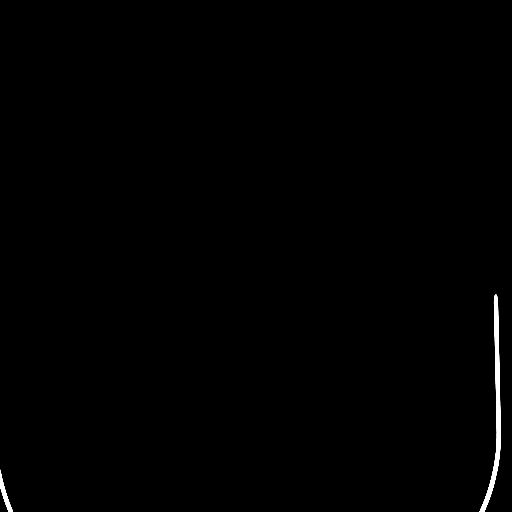
[im 38/41  bone]
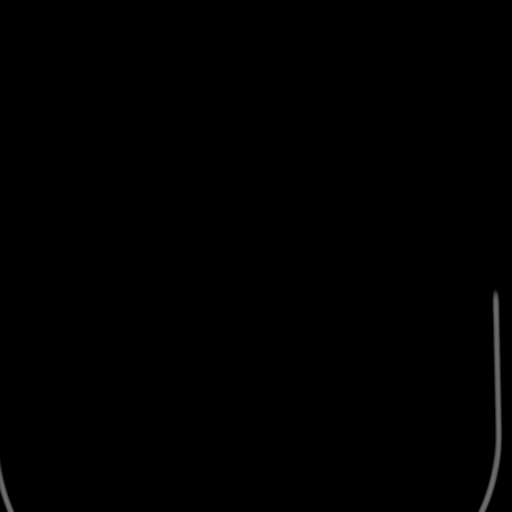

[Series 4: coronal · coronal · 0.29mm/px · 3 of 70 slices shown]
[im 24/70  brain]
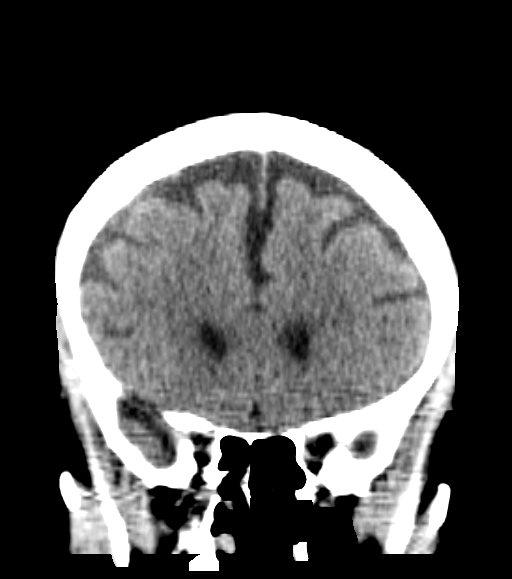
[im 31/70  brain]
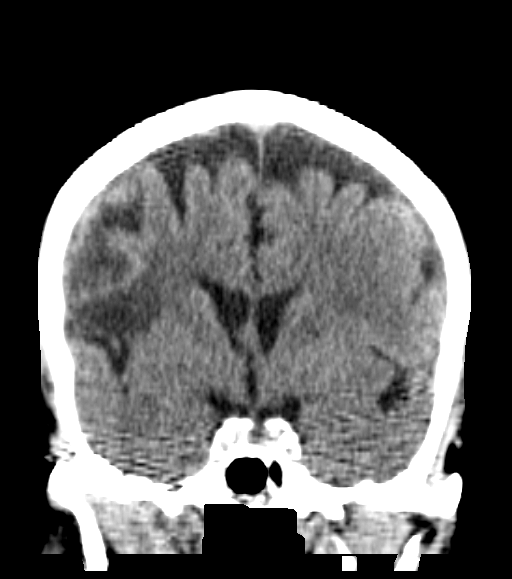
[im 39/70  brain]
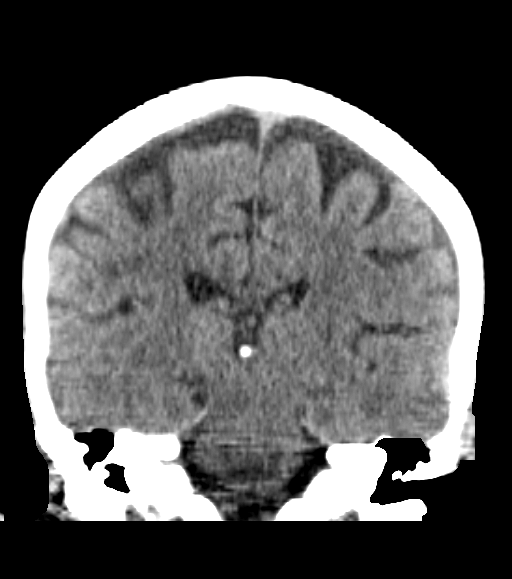

[Series 5: sagittal · sagittal · 0.32mm/px · 3 of 53 slices shown]
[im 18/53  brain]
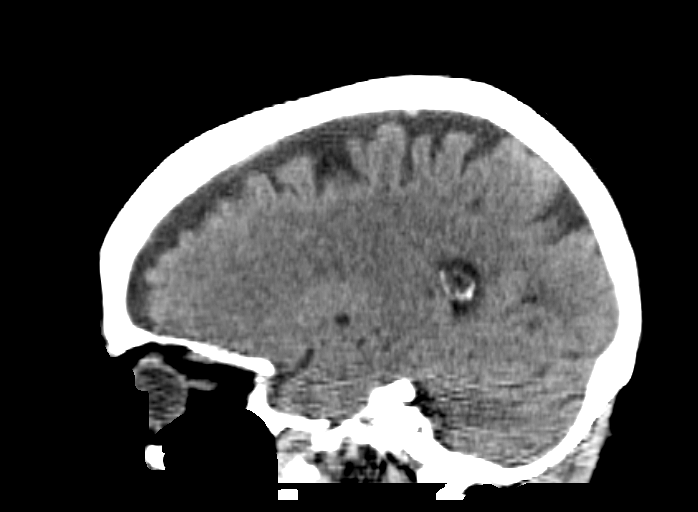
[im 27/53  brain]
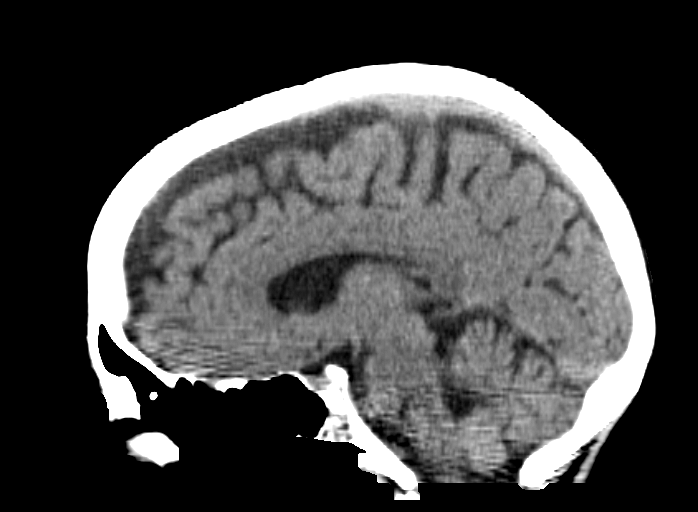
[im 35/53  brain]
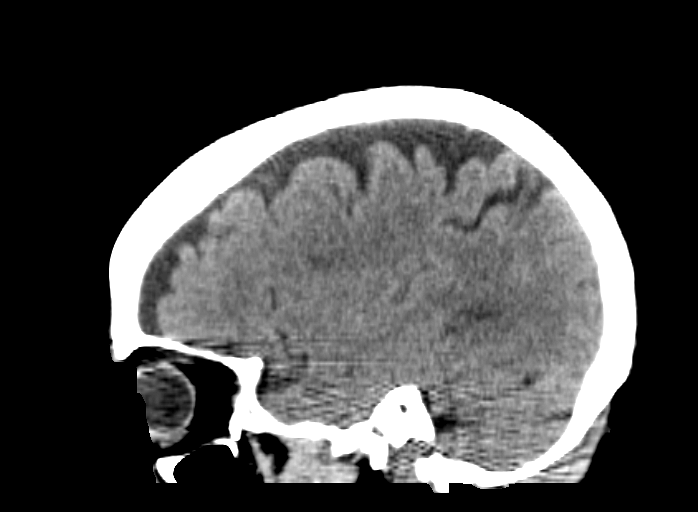

[15 of 47 positions shown; findings below may reference images not displayed]

FINDINGS: Brain: Mild generalized cerebral volume loss noted. Remote infarcts
in the right frontal parietal region and bilateral basal ganglia
again noted.

No acute intracranial abnormalities are identified, including mass
lesion or mass effect, hydrocephalus, extra-axial fluid collection,
midline shift, hemorrhage, or acute infarction. The visualized bony
calvarium is unremarkable.

Vascular: Mild intracranial vascular calcifications noted.

Skull: No acute or suspicious abnormality.

Sinuses/Orbits: Unremarkable

Other: None.
IMPRESSION: No evidence of acute intracranial abnormality.

Remote infarcts in the right frontoparietal region and bilateral
basal ganglia.

## 2016-11-26 IMAGING — MR MR MRA HEAD W/O CM
1 series · 16 of 48 positions shown · non-contrast
Comparison: Concurrent MRI of brain.  MRA of head 03/06/2014.

CLINICAL DATA: 74 y/o F; slurred speech and left-sided weakness for
1 day.

EXAM:
MRA HEAD WITHOUT CONTRAST
TECHNIQUE: Angiographic images of the Circle of Willis were obtained using MRA
technique without intravenous contrast.

[Series 1: MRA · axial · 0.8mm · 0.38mm/px · z∈[-77,+3]mm · 16 of 107 slices shown]
[im 1/107]
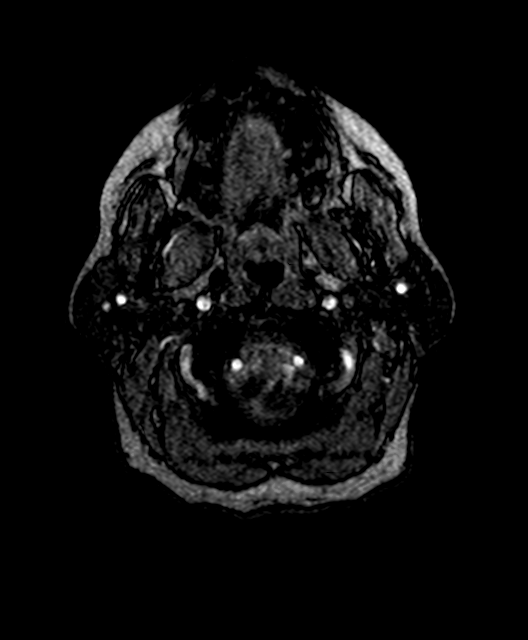
[im 3/107]
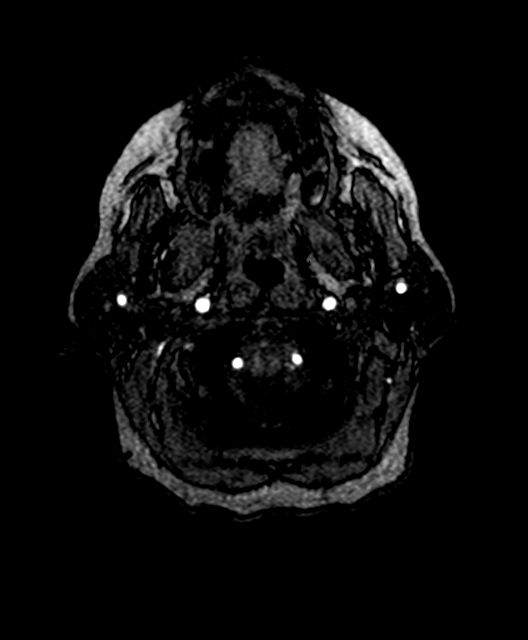
[im 5/107]
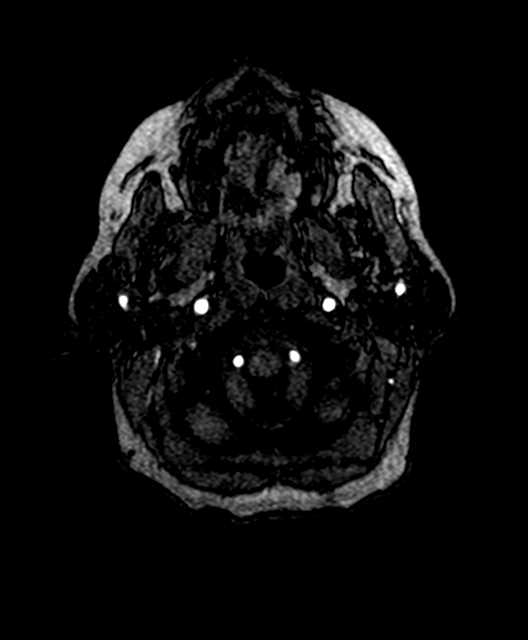
[im 7/107]
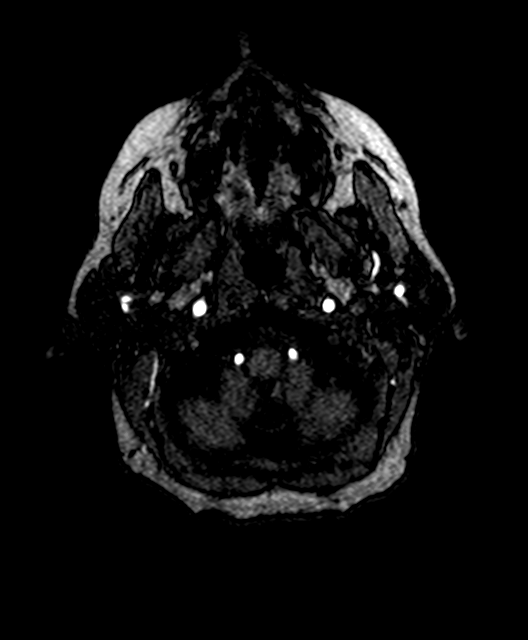
[im 10/107]
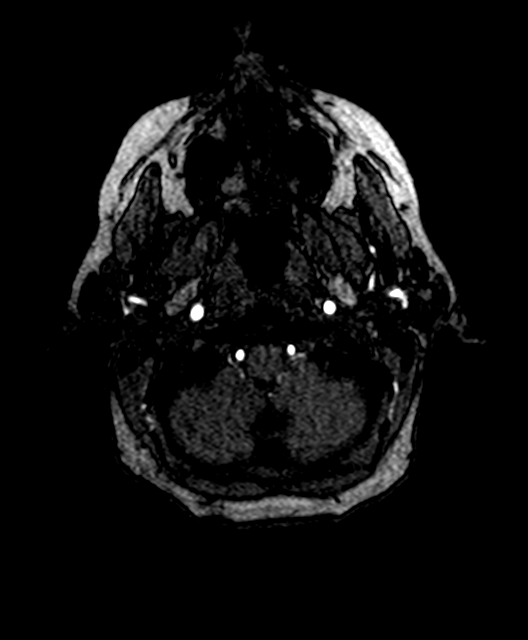
[im 12/107]
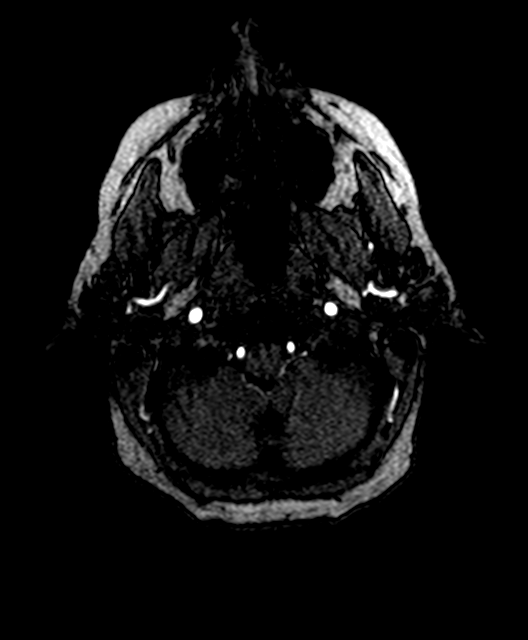
[im 19/107]
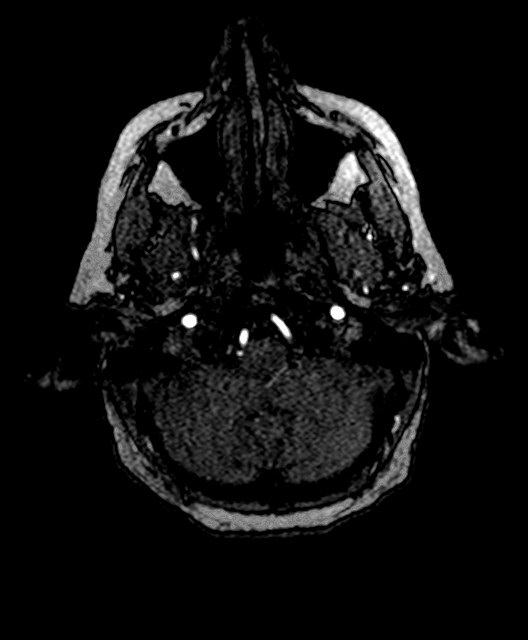
[im 21/107]
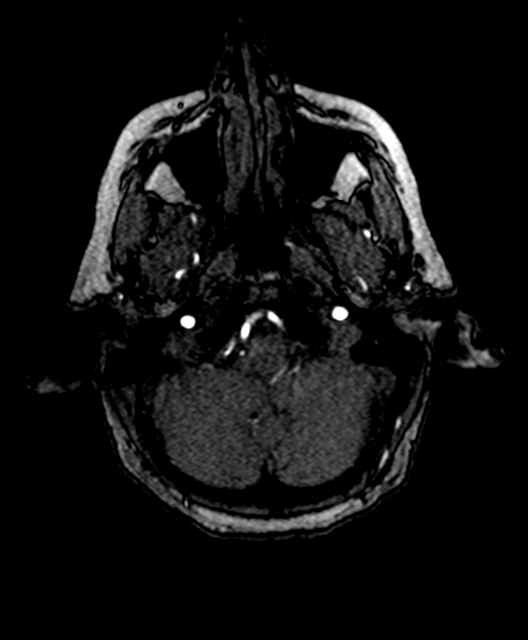
[im 34/107]
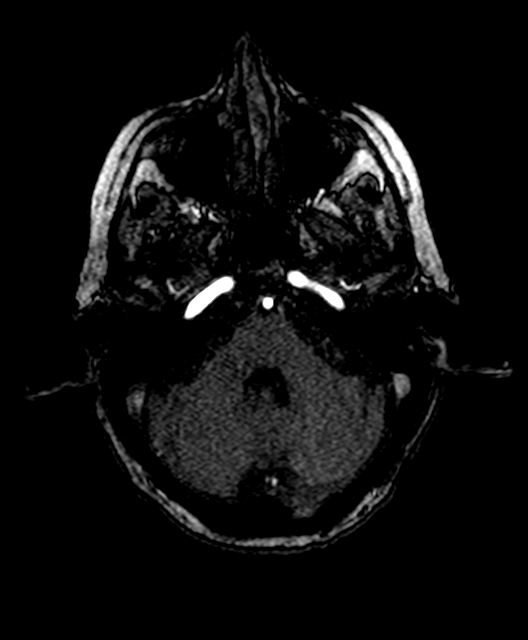
[im 48/107]
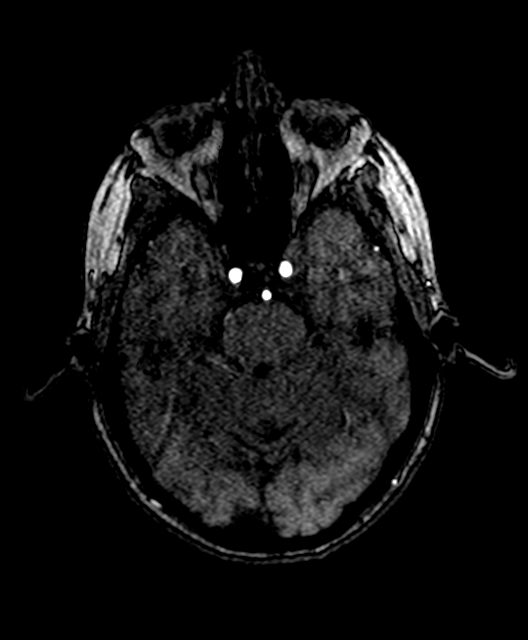
[im 55/107]
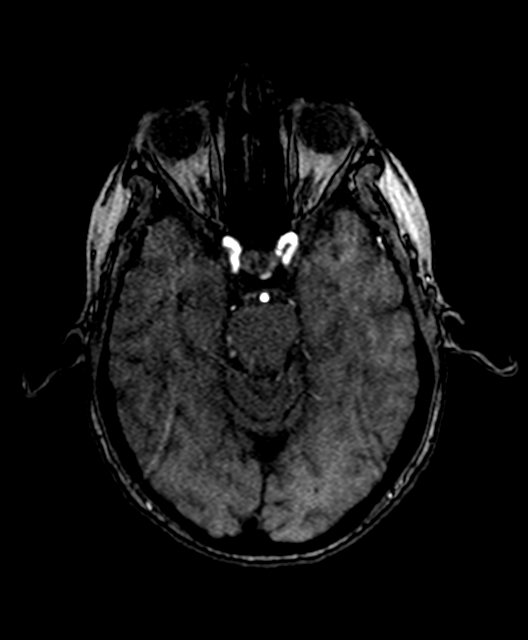
[im 61/107]
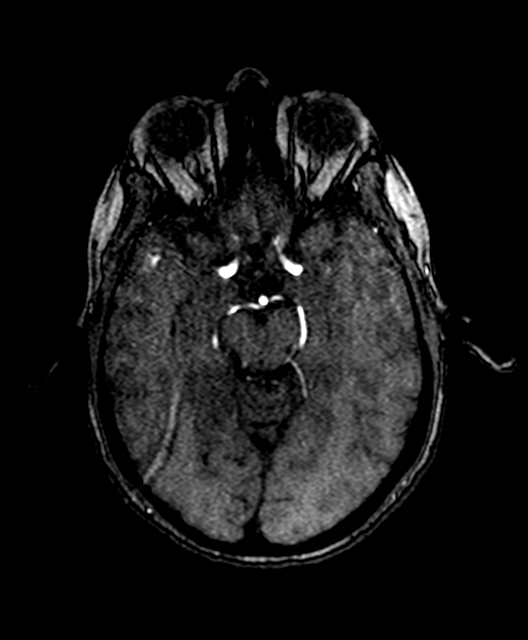
[im 75/107]
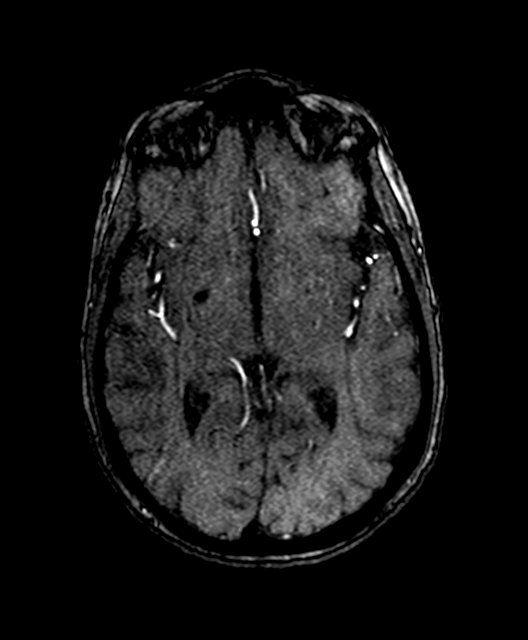
[im 88/107]
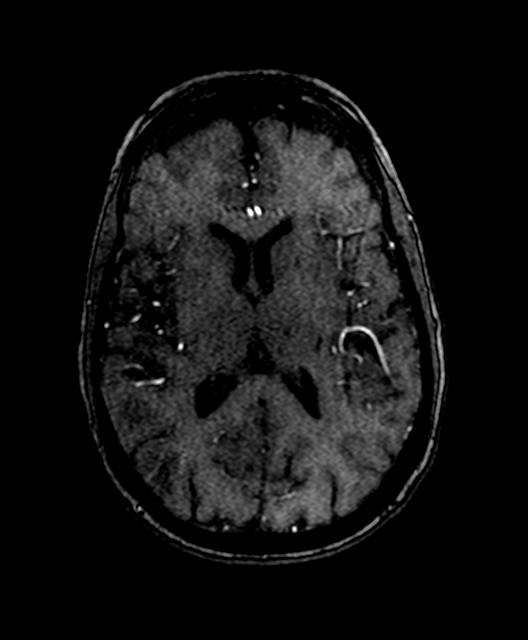
[im 91/107]
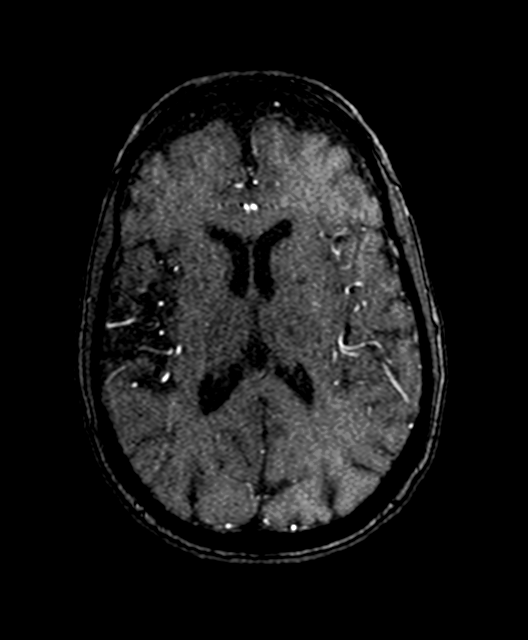
[im 102/107]
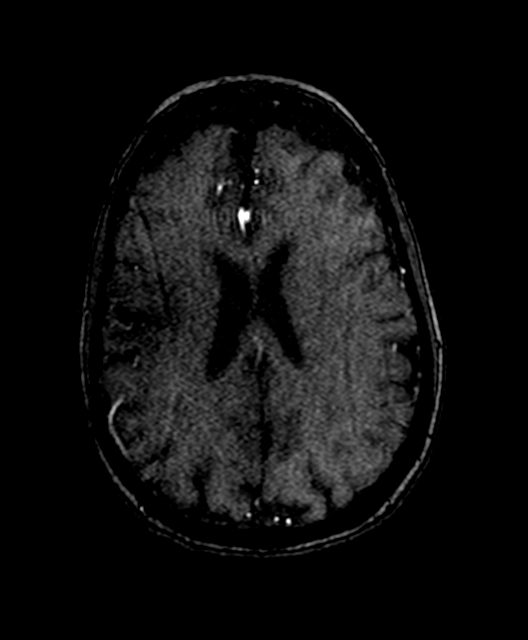

[16 of 48 positions shown; findings below may reference images not displayed]

FINDINGS: Internal carotid arteries: Patent. Mild irregularity of distal
cavernous and paraclinoid segments without significant stenosis
probably represents intracranial atherosclerosis.

Anterior cerebral arteries:  Patent.

Middle cerebral arteries: Patent.

Anterior communicating artery: Possible diminutive A-comm
identified.

Posterior communicating arteries: Not identified, hypoplastic or
absent.

Posterior cerebral arteries: Patent. Stable proximal right P2
segment of moderate stenosis likely due to atherosclerosis.

Basilar artery:  Patent.

Vertebral arteries:  Patent.
IMPRESSION: No large vessel occlusion, aneurysm, or high-grade stenosis is
identified. Stable findings of intracranial atherosclerosis.

By: Iraneide Andersom M.D.

## 2016-11-28 DIAGNOSIS — S52571P Other intraarticular fracture of lower end of right radius, subsequent encounter for closed fracture with malunion: Secondary | ICD-10-CM | POA: Diagnosis not present

## 2016-11-28 DIAGNOSIS — M25531 Pain in right wrist: Secondary | ICD-10-CM | POA: Diagnosis not present

## 2016-12-07 ENCOUNTER — Observation Stay (HOSPITAL_COMMUNITY)
Admission: EM | Admit: 2016-12-07 | Discharge: 2016-12-08 | Disposition: A | Discharge status: Home / Self Care | Payer: Medicare Other | Attending: Internal Medicine | Admitting: Internal Medicine

## 2016-12-07 ENCOUNTER — Encounter (HOSPITAL_COMMUNITY): Payer: Self-pay | Admitting: *Deleted

## 2016-12-07 ENCOUNTER — Emergency Department (HOSPITAL_COMMUNITY): Payer: Medicare Other

## 2016-12-07 DIAGNOSIS — R0789 Other chest pain: Secondary | ICD-10-CM | POA: Diagnosis not present

## 2016-12-07 DIAGNOSIS — R778 Other specified abnormalities of plasma proteins: Secondary | ICD-10-CM

## 2016-12-07 DIAGNOSIS — R7989 Other specified abnormal findings of blood chemistry: Secondary | ICD-10-CM

## 2016-12-07 DIAGNOSIS — R079 Chest pain, unspecified: Secondary | ICD-10-CM | POA: Diagnosis not present

## 2016-12-07 DIAGNOSIS — Z7984 Long term (current) use of oral hypoglycemic drugs: Secondary | ICD-10-CM | POA: Insufficient documentation

## 2016-12-07 DIAGNOSIS — Z7982 Long term (current) use of aspirin: Secondary | ICD-10-CM | POA: Diagnosis not present

## 2016-12-07 DIAGNOSIS — R000 Tachycardia, unspecified: Principal | ICD-10-CM

## 2016-12-07 DIAGNOSIS — E876 Hypokalemia: Secondary | ICD-10-CM | POA: Diagnosis not present

## 2016-12-07 DIAGNOSIS — I471 Supraventricular tachycardia, unspecified: Secondary | ICD-10-CM | POA: Diagnosis present

## 2016-12-07 DIAGNOSIS — I10 Essential (primary) hypertension: Secondary | ICD-10-CM | POA: Insufficient documentation

## 2016-12-07 DIAGNOSIS — E119 Type 2 diabetes mellitus without complications: Secondary | ICD-10-CM | POA: Insufficient documentation

## 2016-12-07 DIAGNOSIS — D696 Thrombocytopenia, unspecified: Secondary | ICD-10-CM

## 2016-12-07 DIAGNOSIS — I2510 Atherosclerotic heart disease of native coronary artery without angina pectoris: Secondary | ICD-10-CM | POA: Insufficient documentation

## 2016-12-07 DIAGNOSIS — Z79899 Other long term (current) drug therapy: Secondary | ICD-10-CM | POA: Diagnosis not present

## 2016-12-07 DIAGNOSIS — R0602 Shortness of breath: Secondary | ICD-10-CM | POA: Diagnosis not present

## 2016-12-07 DIAGNOSIS — R748 Abnormal levels of other serum enzymes: Secondary | ICD-10-CM | POA: Diagnosis not present

## 2016-12-07 DIAGNOSIS — Z87891 Personal history of nicotine dependence: Secondary | ICD-10-CM | POA: Insufficient documentation

## 2016-12-07 LAB — TROPONIN I
TROPONIN I: 0.29 ng/mL — AB (ref <0.03)
Troponin I: 0.32 ng/mL (ref <0.03)
Troponin I: 0.28 ng/mL (ref <0.03)

## 2016-12-07 LAB — BASIC METABOLIC PANEL
ANION GAP: 9 (ref 5–15)
BUN: 11 mg/dL (ref 6–20)
CALCIUM: 8.8 mg/dL — AB (ref 8.9–10.3)
CO2: 28 mmol/L (ref 22–32)
Chloride: 104 mmol/L (ref 101–111)
Creatinine, Ser: 0.72 mg/dL (ref 0.44–1.00)
GFR calc Af Amer: >60 mL/min (ref >60)
GLUCOSE: 117 mg/dL — AB (ref 65–99)
POTASSIUM: 2.6 mmol/L — AB (ref 3.5–5.1)
Sodium: 141 mmol/L (ref 135–145)

## 2016-12-07 LAB — MAGNESIUM: MAGNESIUM: 2 mg/dL (ref 1.7–2.4)

## 2016-12-07 LAB — CBC
HEMATOCRIT: 34.8 % — AB (ref 36.0–46.0)
HEMOGLOBIN: 11.4 g/dL — AB (ref 12.0–15.0)
MCH: 27.9 pg (ref 26.0–34.0)
MCHC: 32.8 g/dL (ref 30.0–36.0)
MCV: 85.1 fL (ref 78.0–100.0)
Platelets: 149 10*3/uL — ABNORMAL LOW (ref 150–400)
RBC: 4.09 MIL/uL (ref 3.87–5.11)
RDW: 16.7 % — AB (ref 11.5–15.5)
WBC: 3.6 10*3/uL — ABNORMAL LOW (ref 4.0–10.5)

## 2016-12-07 MED ORDER — CARVEDILOL 12.5 MG PO TABS
25.0000 mg | ORAL_TABLET | Freq: Two times a day (BID) | ORAL | Status: DC
  Administered 2016-12-07 – 2016-12-08 (×3): 25 mg via ORAL
  Filled 2016-12-07 (×3): qty 2

## 2016-12-07 MED ORDER — POTASSIUM CHLORIDE CRYS ER 20 MEQ PO TBCR
40.0000 meq | EXTENDED_RELEASE_TABLET | Freq: Every day | ORAL | Status: DC
  Administered 2016-12-07 – 2016-12-08 (×2): 40 meq via ORAL
  Filled 2016-12-07 (×2): qty 2

## 2016-12-07 MED ORDER — POTASSIUM CHLORIDE 10 MEQ/100ML IV SOLN
10.0000 meq | INTRAVENOUS | Status: AC
  Administered 2016-12-07 (×3): 10 meq via INTRAVENOUS
  Filled 2016-12-07: qty 100

## 2016-12-07 MED ORDER — CLOPIDOGREL BISULFATE 75 MG PO TABS
75.0000 mg | ORAL_TABLET | Freq: Every day | ORAL | Status: DC
  Administered 2016-12-07: 75 mg via ORAL
  Filled 2016-12-07: qty 1

## 2016-12-07 MED ORDER — PANTOPRAZOLE SODIUM 40 MG PO TBEC
40.0000 mg | DELAYED_RELEASE_TABLET | Freq: Every day | ORAL | Status: DC
  Administered 2016-12-07: 40 mg via ORAL
  Filled 2016-12-07: qty 1

## 2016-12-07 MED ORDER — ATORVASTATIN CALCIUM 40 MG PO TABS
40.0000 mg | ORAL_TABLET | Freq: Every day | ORAL | Status: DC
  Administered 2016-12-07: 40 mg via ORAL
  Filled 2016-12-07: qty 1

## 2016-12-07 MED ORDER — ENOXAPARIN SODIUM 30 MG/0.3ML ~~LOC~~ SOLN
30.0000 mg | SUBCUTANEOUS | Status: DC
  Administered 2016-12-07: 30 mg via SUBCUTANEOUS
  Filled 2016-12-07: qty 0.3

## 2016-12-07 MED ORDER — ACETAMINOPHEN 650 MG RE SUPP: 650.0000 mg | Freq: Four times a day (QID) | RECTAL | Status: DC | PRN

## 2016-12-07 MED ORDER — ACETAMINOPHEN 325 MG PO TABS
650.0000 mg | ORAL_TABLET | Freq: Four times a day (QID) | ORAL | Status: DC | PRN
  Administered 2016-12-07: 650 mg via ORAL
  Filled 2016-12-07: qty 2

## 2016-12-07 MED ORDER — IBUPROFEN 400 MG PO TABS
400.0000 mg | ORAL_TABLET | Freq: Once | ORAL | Status: AC
  Administered 2016-12-07: 400 mg via ORAL

## 2016-12-07 MED ORDER — ASPIRIN 81 MG PO CHEW
243.0000 mg | CHEWABLE_TABLET | Freq: Every day | ORAL | Status: DC
  Administered 2016-12-07: 243 mg via ORAL
  Filled 2016-12-07: qty 3

## 2016-12-07 MED ORDER — ISOSORBIDE MONONITRATE ER 30 MG PO TB24
30.0000 mg | ORAL_TABLET | Freq: Every day | ORAL | Status: DC
  Filled 2016-12-07: qty 1

## 2016-12-07 MED ORDER — IBUPROFEN 400 MG PO TABS
ORAL_TABLET | ORAL | Status: AC
  Filled 2016-12-07: qty 1

## 2016-12-07 MED ORDER — ZOLPIDEM TARTRATE 5 MG PO TABS
5.0000 mg | ORAL_TABLET | Freq: Every day | ORAL | Status: DC
  Administered 2016-12-07: 5 mg via ORAL
  Filled 2016-12-07: qty 1

## 2016-12-07 MED ORDER — DILTIAZEM HCL 30 MG PO TABS
30.0000 mg | ORAL_TABLET | Freq: Two times a day (BID) | ORAL | Status: DC
  Administered 2016-12-07 – 2016-12-08 (×2): 30 mg via ORAL
  Filled 2016-12-07 (×3): qty 1

## 2016-12-07 MED ORDER — POTASSIUM CHLORIDE CRYS ER 20 MEQ PO TBCR
40.0000 meq | EXTENDED_RELEASE_TABLET | Freq: Once | ORAL | Status: AC
  Administered 2016-12-07: 40 meq via ORAL
  Filled 2016-12-07: qty 2

## 2016-12-07 MED ORDER — LISINOPRIL 10 MG PO TABS
10.0000 mg | ORAL_TABLET | Freq: Every day | ORAL | Status: DC
  Administered 2016-12-07: 10 mg via ORAL
  Filled 2016-12-07: qty 1

## 2016-12-07 NOTE — ED Notes (Signed)
Hospitalist at bedside 

## 2016-12-07 NOTE — ED Notes (Signed)
CRITICAL VALUE ALERT  Critical value received:  Potassium 2.6  Date of notification:  12/07/2016  Time of notification:  2841  Critical value read back:  Yes  Nurse who received alert:  Cena Benton  MD notified (1st page):  Lacinda Axon  Time of first page:  1002  Responding MD:  Lacinda Axon  Time MD responded:  3244

## 2016-12-07 NOTE — H&P (Signed)
History and Physical  [name] IOM:[id number] DOB: [birth year] DOA: 12/07/2016   PCP: Glo Herring, MD   Patient coming from: Home  Chief Complaint: heart racing, chest pain  HPI:  [name] is a 76 y.o. female with medical history of coronary artery disease, hypertension, aortic insufficiency, SVT, and NASH presenting with chest pain and heart racing sensation. The patient began feeling that her heart was racing around 11 PM on 12/06/2016 while she was sitting in bed watching television. She continued to have the heart racing sensation throughout the night. Around 7:30 AM on 12/07/2016, the patient developed right sharp chest pain radiating to her right shoulder that lasted approximately one hour. She had associated shortness of breath but no nausea, vomiting, or diaphoresis. The patient was recently discharged from the hospital after a stay from 11/02/2016 through 11/04/2016 for a similar presentation when her troponin peaked at 1.33.  She was felt to have had an NSTEMI related to demand ischemia. Her carvedilol was increased to 25 mg twice a day. The patient endorses compliance with all her medications. She denies any new medications or over-the-counter medications. In the emergency department, the patient was chest pain-free.  In the emergency department, the patient was afebrile and hemodynamically stable saturating 97% on room air. Potassium was noted to be 2.6. Otherwise BMP was unremarkable. WBC was 3.6, hemoglobin 11.4, platelets 149,000. Troponin was 0.32. EKG shows sinus rhythm without any ST-T wave changes. Chest x-ray showed chronic bronchitic changes. Dr. Marlou Porch was contacted and recommended observation admission.  Assessment/Plan: Atypical chest pain/Elevated troponin -suspect elevated troponin is due to pt's tachycardia -do not feel pt has ACS -EKG--sinus without concerning ischemic changes -cycle troponins -09/26/2016 and heart catheterization--minimal CAD, patent  RCA stent, mild AS, EF 55-65%. -11/03/2016 echo EF 65-70%, no WMA, mild-mod AI -Continue Imdur and carvedilol -Continue aspirin and Plavix  SVT -no episodes in ED -monitor on telemetry -Continue carvedilol and diltiazem  Hypokalemia -Replete with the goal of keeping K>4 -check mag  Coronary artery disease --09/26/2016 and heart catheterization--minimal CAD, patent RCA stent, mild AS, EF 55-65%. -Continue aspirin and Plavix -Continue statin  Thrombocytopenia -this has been chronic -check B12  Hyperlipidemia -Continue statin  Diabetes mellitus type 2 -Now well controlled with diet -11/02/2016 hemoglobin A1c 5.4      Past Medical History:  Diagnosis Date  . Anemia   . Aortic insufficiency    Moderate  . Back pain   . Carotid stenosis, bilateral   . Cirrhosis ([place])   . Coronary artery disease    Stent x 2 RCA 1995, cardiac catheterization 09/2016 showing only mild atherosclerosis  . DDD (degenerative disc disease), lumbar   . Essential hypertension   . GERD (gastroesophageal reflux disease)   . Hiatal hernia   . History of stroke   . Hypercholesteremia   . IBS (irritable bowel syndrome)   . Myocardial infarction 1995  . Non-alcoholic fatty liver disease   . OSA (obstructive sleep apnea)    Sleep study 2009  AHI 9.91/hr and during REM sleep 35.58/hr  . Pericardial effusion    a. small by echo 2018.  Marland Kitchen PSVT (paroxysmal supraventricular tachycardia) ([place])   . Recurrent UTI   . Type 2 diabetes mellitus ([place])   . Varices, esophageal ([place])    Past Surgical History:  Procedure Laterality Date  . [place]  . APPENDECTOMY    . BACK SURGERY  1985  . CARDIAC CATHETERIZATION  [phone]  Stent to the proximal RCA after MI   . CARDIAC CATHETERIZATION N/A 09/26/2016   Procedure: Left Heart Cath and Coronary Angiography;  Surgeon: Leonie Man, MD;  Location: [facility];  Service: Cardiovascular;  Laterality: N/A;  . CATARACT  EXTRACTION W/PHACO Right 07/04/2014   Procedure: CATARACT EXTRACTION PHACO AND INTRAOCULAR LENS PLACEMENT (IOC);  Surgeon: Elta Guadeloupe T. Gershon Crane, MD;  Location: [facility];  Service: Ophthalmology;  Laterality: Right;  CDE:13.13  . COLONOSCOPY    . DILATION AND CURETTAGE OF UTERUS     x2  . Epi Retinal Membrane Peel Left   . ERCP    . ESOPHAGEAL BANDING N/A 04/01/2013   Procedure: ESOPHAGEAL BANDING;  Surgeon: Rogene Houston, MD;  Location: [facility];  Service: Endoscopy;  Laterality: N/A;  . ESOPHAGEAL BANDING N/A 05/24/2013   Procedure: ESOPHAGEAL BANDING;  Surgeon: Rogene Houston, MD;  [facility];  Service: Endoscopy;  Laterality: N/A;  . ESOPHAGEAL BANDING N/A 06/21/2014   Procedure: ESOPHAGEAL BANDING;  Surgeon: Rogene Houston, MD;  [facility];  Service: Endoscopy;  Laterality: N/A;  . ESOPHAGOGASTRODUODENOSCOPY N/A 04/01/2013   Procedure: ESOPHAGOGASTRODUODENOSCOPY (EGD);  Surgeon: Rogene Houston, MD;  [facility];  Service: Endoscopy;  Laterality: N/A;  230-rescheduled to 8:30am Ann notified pt  . ESOPHAGOGASTRODUODENOSCOPY N/A 05/24/2013   Procedure: ESOPHAGOGASTRODUODENOSCOPY (EGD);  Surgeon: Rogene Houston, MD;  [facility];  Service: Endoscopy;  Laterality: N/A;  730  . ESOPHAGOGASTRODUODENOSCOPY N/A 06/21/2014   Procedure: ESOPHAGOGASTRODUODENOSCOPY (EGD);  Surgeon: Rogene Houston, MD;  [facility];  Service: Endoscopy;  Laterality: N/A;  930-rescheduled 10/14 @ 1200 Ann to notify pt  . EYE SURGERY  08   cataract surgery of the left eye  . HARDWARE REMOVAL Right 01/17/2013   Procedure: REMOVAL OF HARDWARE AND EXCISION ULNAR STYLOID RIGHT WRIST;  Surgeon: Tennis Must, MD;  Location: [facility];  Service: Orthopedics;  Laterality: Right;  . MYRINGOTOMY  2012   both ears  . TONSILLECTOMY    . VAGINAL HYSTERECTOMY  1972  . WRIST SURGERY     rt wrist hardwear removal   Social History:  reports that she quit  smoking about 4 years ago. Her smoking use included Cigarettes. She has a 12.50 pack-year smoking history. She has never used smokeless tobacco. She reports that she does not drink alcohol or use drugs.   Family History  Problem Relation Age of Onset  . Diabetes Mother   . Heart failure Father   . Heart failure Maternal Aunt      Allergies  Allergen Reactions  . Tape Rash     Prior to Admission medications   Medication Sig Start Date End Date Taking? Authorizing Provider  acetaminophen (TYLENOL) 500 MG tablet Take 1,000 mg by mouth 2 (two) times daily.   Yes Historical Provider, MD  aspirin 81 MG chewable tablet Chew 243 mg by mouth at bedtime.   Yes Historical Provider, MD  atorvastatin (LIPITOR) 40 MG tablet Take 40 mg by mouth at bedtime.    Yes Historical Provider, MD  carvedilol (COREG) 25 MG tablet Take 1 tablet (25 mg total) by mouth 2 (two) times daily. 11/04/16  Yes Kathie Dike, MD  clopidogrel (PLAVIX) 75 MG tablet Take 1 tablet (75 mg total) by mouth daily. Patient taking differently: Take 75 mg by mouth at bedtime.  11/05/16  Yes Arnoldo Lenis, MD  diltiazem (CARDIZEM) 30 MG tablet Take  1 tablet (30 mg total) by mouth 2 (two) times daily. 11/11/16  Yes Lendon Colonel, NP  ibuprofen (ADVIL,MOTRIN) 200 MG tablet Take 400 mg by mouth 2 (two) times daily.    Yes Historical Provider, MD  isosorbide mononitrate (IMDUR) 30 MG 24 hr tablet Take 30 mg by mouth at bedtime.    Yes Historical Provider, MD  lisinopril (PRINIVIL,ZESTRIL) 20 MG tablet Take 0.5 tablets (10 mg total) by mouth daily. Patient taking differently: Take 10 mg by mouth at bedtime.  11/04/16  Yes Kathie Dike, MD  ondansetron (ZOFRAN ODT) 8 MG disintegrating tablet Take 1 tablet (8 mg total) by mouth every 8 (eight) hours as needed for nausea or vomiting. 11/10/16  Yes Butch Penny, NP  pantoprazole (PROTONIX) 40 MG tablet Take 40 mg by mouth at bedtime.   Yes Historical Provider, MD  zolpidem (AMBIEN) 10  MG tablet Take 10 mg by mouth at bedtime.   Yes Historical Provider, MD    Review of Systems:  Constitutional:  No weight loss, night sweats, Fevers, chills, fatigue.  Head&Eyes: No headache.  No vision loss.  No eye pain or scotoma ENT:  No Difficulty swallowing,Tooth/dental problems,Sore throat,  No ear ache, post nasal drip,  Cardio-vascular:  No  Orthopnea, PND, swelling in lower extremities,  dizziness, palpitations  GI:  No  abdominal pain, nausea, vomiting, diarrhea, loss of appetite, hematochezia, melena, heartburn, indigestion, Resp:  No  No cough. No coughing up of blood .No wheezing.No chest wall deformity  Skin:  no rash or lesions.  GU:  no dysuria, change in color of urine, no urgency or frequency. No flank pain.  Musculoskeletal:  No joint pain or swelling. No decreased range of motion. No back pain.  Psych:  No change in mood or affect. No depression or anxiety. Neurologic: No headache, no dysesthesia, no focal weakness, no vision loss. No syncope  Physical Exam: Vitals:   12/07/16 0901 12/07/16 1000 12/07/16 1100 12/07/16 1130  BP: (!) 180/66 (!) 185/64 (!) 155/57 (!) 167/68  Pulse: 76 82 75 75  Resp: 18 18 13 14   Temp: 98.1 F (36.7 C)     TempSrc: Oral     SpO2: 97% 97% 96% 96%  Weight:      Height:       General:  A&O x 3, NAD, nontoxic, pleasant/cooperative Head/Eye: No conjunctival hemorrhage, no icterus, [place]/AT, No nystagmus ENT:  No icterus,  No thrush, good dentition, no pharyngeal exudate Neck:  No masses, no lymphadenpathy, no bruits CV:  RRR, no rub, no gallop, no S3 Lung:  CTAB, good air movement, no wheeze, no rhonchi Abdomen: soft/NT, +BS, nondistended, no peritoneal signs Ext: No cyanosis, No rashes, No petechiae, No lymphangitis, No edema Neuro: CNII-XII intact, strength 4/5 in bilateral upper and lower extremities, no dysmetria  Labs on Admission:  Basic Metabolic Panel:  Recent Labs Lab 12/07/16 0900  NA 141  K 2.6*  CL 104    CO2 28  GLUCOSE 117*  BUN 11  CREATININE 0.72  CALCIUM 8.8*   Liver Function Tests: No results for input(s): AST, ALT, ALKPHOS, BILITOT, PROT, ALBUMIN in the last 168 hours. No results for input(s): LIPASE, AMYLASE in the last 168 hours. No results for input(s): AMMONIA in the last 168 hours. CBC:  Recent Labs Lab 12/07/16 0900  WBC 3.6*  HGB 11.4*  HCT 34.8*  MCV 85.1  PLT 149*   Coagulation Profile: No results for input(s): INR, PROTIME in the last 168  hours. Cardiac Enzymes:  Recent Labs Lab 12/07/16 0900  TROPONINI 0.32*   BNP: Invalid input(s): POCBNP CBG: No results for input(s): GLUCAP in the last 168 hours. Urine analysis:    Component Value Date/Time   COLORURINE YELLOW 02/08/2016 0205   APPEARANCEUR HAZY (A) 02/08/2016 0205   LABSPEC 1.025 02/08/2016 0205   PHURINE 5.5 02/08/2016 0205   GLUCOSEU NEGATIVE 02/08/2016 0205   HGBUR NEGATIVE 02/08/2016 0205   BILIRUBINUR NEGATIVE 02/08/2016 0205   KETONESUR NEGATIVE 02/08/2016 0205   PROTEINUR TRACE (A) 02/08/2016 0205   UROBILINOGEN 0.2 07/09/2015 0930   NITRITE NEGATIVE 02/08/2016 0205   LEUKOCYTESUR NEGATIVE 02/08/2016 0205   Sepsis Labs: @LABRCNTIP (procalcitonin:4,lacticidven:4) )No results found for this or any previous visit (from the past 240 hour(s)).   Radiological Exams on Admission: Dg Chest 2 View  Result Date: 12/07/2016 CLINICAL DATA:  Chest pain and shortness of breath. EXAM: CHEST  2 VIEW COMPARISON:  11/08/2016. FINDINGS: Normal sized heart. Clear lungs. Mildly tortuous and calcified thoracic aorta. Prominent AP diameter of the chest and mild diffuse peribronchial thickening. Thoracic spine degenerative changes. IMPRESSION: 1. No acute abnormality. 2. Mild changes of COPD and chronic bronchitis. 3. Aortic atherosclerosis. Electronically Signed   By: Claudie Revering M.D.   On: 12/07/2016 09:32    EKG: Independently reviewed. Sinus rhythm, no ST-T wave changes    Time spent:60  minutes Code Status:   FULL Family Communication:  [place] daughter updated at bedside Disposition Plan: expect 1-2 day hospitalization Consults called: cardiology DVT Prophylaxis: [place] Lovenox  Tanesha Arambula, DO  [facility] Pager [phone]  If 7PM-7AM, please contact night-coverage [email or web address] Password [email or web address] 12/07/2016, 12:05 PM

## 2016-12-07 NOTE — ED Triage Notes (Signed)
Pt states she felt her heart starting to race last night around 2300. She took a cardizem pill and states she felt her heart rate decrease. This morning around 0600 she began having right sided chest pain accompanied by dizziness.

## 2016-12-07 NOTE — ED Provider Notes (Addendum)
[facility] DEPT Provider Note   CSN: [id number] Arrival date & time: 12/07/16  5038  By signing my name below, I, Reola Mosher, attest that this documentation has been prepared under the direction and in the presence of Nat Christen, MD. Electronically Signed: Reola Mosher, ED Scribe. 12/07/16. 9:46 AM.  History   Chief Complaint Chief Complaint  Patient presents with  . Chest Pain   The history is provided by the patient and medical records. No language interpreter was used.    HPI Comments: [name] is a 76 y.o. female with a PMHx of prior MI, CAD s/p stent placement x2, aortic insufficiency, and GERD, who presents to the Emergency Department complaining of a resolved episode right-anterior chest pain beginning this morning around ~3 hours ago. Per pt, she had an onset of palpitations last night around 2300 (~10 hours ago) which at that time she took her nightly dosage of Diltiazem. This sensation did not resolve following taking this medication or throughout the night, and this morning she had an acute onset of right-anterior chest pain. She describes her pain as sharp with radiation into her right thoracic back. She reports associated shortness of breath which was worse with mild exertion. Her symptoms have since resolved since being in the ED and she states that she otherwise feels at her baseline. Pt underwent cardiac catheterization three months ago, at that time her catheterization revealed mid RCA lesion of 25% stenosed and ost ramus lesion 30% stenosed. RCA stent appeared patent. She also was seen in the ED for same on 03/03, and at that time she did have an elevated troponin but this was trending downward from a recent NSTEMI admission on 02/25. No h/o AFIB. Pt denies diaphoresis, nausea, vomiting, leg swelling, or any other associated symptoms.  Past Medical History:  Diagnosis Date  . Anemia   . Aortic insufficiency    Moderate  . Back pain   . Carotid  stenosis, bilateral   . Cirrhosis ([place])   . Coronary artery disease    Stent x 2 RCA 1995, cardiac catheterization 09/2016 showing only mild atherosclerosis  . DDD (degenerative disc disease), lumbar   . Essential hypertension   . GERD (gastroesophageal reflux disease)   . Hiatal hernia   . History of stroke   . Hypercholesteremia   . IBS (irritable bowel syndrome)   . Myocardial infarction 1995  . Non-alcoholic fatty liver disease   . OSA (obstructive sleep apnea)    Sleep study 2009  AHI 9.91/hr and during REM sleep 35.58/hr  . Pericardial effusion    a. small by echo 2018.  Marland Kitchen PSVT (paroxysmal supraventricular tachycardia) ([place])   . Recurrent UTI   . Type 2 diabetes mellitus ([place])   . Varices, esophageal [facility])    Patient Active Problem List   Diagnosis Date Noted  . Chest pain 12/07/2016  . Atypical chest pain 12/07/2016  . Tachycardia   . NSTEMI (non-ST elevated myocardial infarction) ([place]) 11/02/2016  . Multifocal atrial tachycardia ([place]) 09/25/2016  . Elevated troponin 09/24/2016  . Hypokalemia 09/23/2016  . TIA (transient ischemic attack) 05/05/2016  . Intractable nausea and vomiting 10/15/2015  . Acute coronary syndrome ([place]) 06/01/2015  . Bronchitis 06/01/2015  . Thrombocytopenia ([place]) 06/01/2015  . CVA (cerebral infarction) 03/06/2014  . UTI (lower urinary tract infection)   . PSVT (paroxysmal supraventricular tachycardia) ([place]) 12/09/2013  . Esophageal varices ([place]) 03/29/2013  . Hematemesis 03/29/2013  . Hepatic cirrhosis ([place]) 03/29/2013  . Presence of stent  in right coronary artery 07/04/2011  . OSA (obstructive sleep apnea) 07/04/2011  . Aortic sclerosis 07/04/2011  . Aortic insufficiency 07/04/2011  . HTN (hypertension) 07/04/2011  . Carotid stenosis, bilateral 07/04/2011  . Chest pain at rest 07/03/2011  . CAD (coronary artery disease) 07/03/2011  . DM type 2 (diabetes mellitus, type 2) ([place]) 07/03/2011  . Hypercholesteremia 07/03/2011  . GERD  (gastroesophageal reflux disease) 07/03/2011   Past Surgical History:  Procedure Laterality Date  . [place]  . APPENDECTOMY    . BACK SURGERY  1985  . CARDIAC CATHETERIZATION  [phone]   Stent to the proximal RCA after MI   . CARDIAC CATHETERIZATION N/A 09/26/2016   Procedure: Left Heart Cath and Coronary Angiography;  Surgeon: Leonie Man, MD;  Location: [facility];  Service: Cardiovascular;  Laterality: N/A;  . CATARACT EXTRACTION W/PHACO Right 07/04/2014   Procedure: CATARACT EXTRACTION PHACO AND INTRAOCULAR LENS PLACEMENT (IOC);  Surgeon: Elta Guadeloupe T. Gershon Crane, MD;  Location: [facility];  Service: Ophthalmology;  Laterality: Right;  CDE:13.13  . COLONOSCOPY    . DILATION AND CURETTAGE OF UTERUS     x2  . Epi Retinal Membrane Peel Left   . ERCP    . ESOPHAGEAL BANDING N/A 04/01/2013   Procedure: ESOPHAGEAL BANDING;  Surgeon: Rogene Houston, MD;  Location: [facility] SU[facility];  Service: Endoscopy;  Laterality: N/A;  . ESOPHAGEAL BANDING N/A 05/24/2013   Procedure: ESOPHAGEAL BANDING;  Surgeon: Rogene Houston, MD;  Location: [facility];  Service: Endoscopy;  Laterality: N/A;  . ESOPHAGEAL BANDING N/A 06/21/2014   Procedure: ESOPHAGEAL BANDING;  Surgeon: Rogene Houston, MD;  Location[facility];  Service: Endoscopy;  Laterality: N/A;  . ESOPHAGOGASTRODUODENOSCOPY N/A 04/01/2013   Procedure: ESOPHAGOGASTRODUODENOSCOPY (EGD);  Surgeon: Rogene Houston, MD;  Location[facility];  Service: Endoscopy;  Laterality: N/A;  230-rescheduled to 8:30am Ann notified pt  . ESOPHAGOGASTRODUODENOSCOPY N/A 05/24/2013   Procedure: ESOPHAGOGASTRODUODENOSCOPY (EGD);  Surgeon: Rogene Houston, MD;  [facility];  Service: Endoscopy;  Laterality: N/A;  730  . ESOPHAGOGASTRODUODENOSCOPY N/A 06/21/2014   Procedure: ESOPHAGOGASTRODUODENOSCOPY (EGD);  Surgeon: Rogene Houston, MD;  Location[facility];  Service: Endoscopy;  Laterality: N/A;  930-rescheduled 10/14 @  1200 Ann to notify pt  . EYE SURGERY  08   cataract surgery of the left eye  . HARDWARE REMOVAL Right 01/17/2013   Procedure: REMOVAL OF HARDWARE AND EXCISION ULNAR STYLOID RIGHT WRIST;  Surgeon: Tennis Must, MD;  Location: [facility];  Service: Orthopedics;  Laterality: Right;  . MYRINGOTOMY  2012   both ears  . TONSILLECTOMY    . VAGINAL HYSTERECTOMY  1972  . WRIST SURGERY     rt wrist hardwear removal   OB History    No data available     Home Medications    Prior to Admission medications   Medication Sig Start Date End Date Taking? Authorizing Provider  acetaminophen (TYLENOL) 500 MG tablet Take 1,000 mg by mouth 2 (two) times daily.   Yes Historical Provider, MD  aspirin 81 MG chewable tablet Chew 243 mg by mouth at bedtime.   Yes Historical Provider, MD  atorvastatin (LIPITOR) 40 MG tablet Take 40 mg by mouth at bedtime.    Yes Historical Provider, MD  carvedilol (COREG) 25 MG tablet Take 1 tablet (25 mg total) by mouth 2 (two) times daily. 11/04/16  Yes Kathie Dike, MD  clopidogrel (PLAVIX) 75  MG tablet Take 1 tablet (75 mg total) by mouth daily. Patient taking differently: Take 75 mg by mouth at bedtime.  11/05/16  Yes Arnoldo Lenis, MD  diltiazem (CARDIZEM) 30 MG tablet Take 1 tablet (30 mg total) by mouth 2 (two) times daily. 11/11/16  Yes Lendon Colonel, NP  ibuprofen (ADVIL,MOTRIN) 200 MG tablet Take 400 mg by mouth 2 (two) times daily.    Yes Historical Provider, MD  isosorbide mononitrate (IMDUR) 30 MG 24 hr tablet Take 30 mg by mouth at bedtime.    Yes Historical Provider, MD  lisinopril (PRINIVIL,ZESTRIL) 20 MG tablet Take 0.5 tablets (10 mg total) by mouth daily. Patient taking differently: Take 10 mg by mouth at bedtime.  11/04/16  Yes Kathie Dike, MD  ondansetron (ZOFRAN ODT) 8 MG disintegrating tablet Take 1 tablet (8 mg total) by mouth every 8 (eight) hours as needed for nausea or vomiting. 11/10/16  Yes Butch Penny, NP  pantoprazole  (PROTONIX) 40 MG tablet Take 40 mg by mouth at bedtime.   Yes Historical Provider, MD  zolpidem (AMBIEN) 10 MG tablet Take 10 mg by mouth at bedtime.   Yes Historical Provider, MD   Family History Family History  Problem Relation Age of Onset  . Diabetes Mother   . Heart failure Father   . Heart failure Maternal Aunt    Social History Social History  Substance Use Topics  . Smoking status: Former Smoker    Packs/day: 0.25    Years: 50.00    Types: Cigarettes    Quit date: 01/13/2012  . Smokeless tobacco: Never Used  . Alcohol use No   Allergies   Tape  Review of Systems Review of Systems  A complete 10 system review of systems was obtained and all systems are negative except as noted in the HPI and PMH.   Physical Exam Updated Vital Signs BP (!) 167/68   Pulse 75   Temp 98.1 F (36.7 C) (Oral)   Resp 14   Ht 5' 3"  (1.6 m)   Wt 140 lb (63.5 kg)   SpO2 96%   BMI 24.80 kg/m   Physical Exam  Constitutional: She is oriented to person, place, and time. She appears well-developed and well-nourished.  HENT:  Head: Normocephalic and atraumatic.  Eyes: Conjunctivae are normal.  Neck: Neck supple.  Cardiovascular: Normal rate, regular rhythm and normal heart sounds.  Exam reveals no gallop and no friction rub.   No murmur heard. Pulmonary/Chest: Effort normal and breath sounds normal. No respiratory distress. She has no wheezes. She has no rales.  Abdominal: Soft. Bowel sounds are normal.  Musculoskeletal: Normal range of motion. She exhibits no edema.  No bilateral lower extremity swelling.   Neurological: She is alert and oriented to person, place, and time.  Skin: Skin is warm and dry.  Psychiatric: She has a normal mood and affect. Her behavior is normal.  Nursing note and vitals reviewed.  ED Treatments / Results  DIAGNOSTIC STUDIES: Oxygen Saturation is 97% on RA, normal by my interpretation.   COORDINATION OF CARE: 9:44 AM-Discussed next steps with pt  including cardiac workup and cardiac monitoring. Pt verbalized understanding and is agreeable with the plan.   Labs (all labs ordered are listed, but only abnormal results are displayed) Labs Reviewed  BASIC METABOLIC PANEL - Abnormal; Notable for the following:       Result Value   Potassium 2.6 (*)    Glucose, Bld 117 (*)    Calcium  8.8 (*)    All other components within normal limits  CBC - Abnormal; Notable for the following:    WBC 3.6 (*)    Hemoglobin 11.4 (*)    HCT 34.8 (*)    RDW 16.7 (*)    Platelets 149 (*)    All other components within normal limits  TROPONIN I - Abnormal; Notable for the following:    Troponin I 0.32 (*)    All other components within normal limits  MAGNESIUM  TROPONIN I  TROPONIN I  VITAMIN B12   EKG  EKG Interpretation  Date/Time:  Sunday December 07 2016 09:01:28 EDT Ventricular Rate:  77 PR Interval:    QRS Duration: 106 QT Interval:  431 QTC Calculation: 488 R Axis:   -37 Text Interpretation:  Sinus rhythm Left axis deviation Anteroseptal infarct, age indeterminate Baseline wander in lead(s) V3 Confirmed by Blandina Renaldo  MD, Arina Torry ([id number]) on 12/07/2016 9:36:59 AM      Radiology Dg Chest 2 View  Result Date: 12/07/2016 CLINICAL DATA:  Chest pain and shortness of breath. EXAM: CHEST  2 VIEW COMPARISON:  11/08/2016. FINDINGS: Normal sized heart. Clear lungs. Mildly tortuous and calcified thoracic aorta. Prominent AP diameter of the chest and mild diffuse peribronchial thickening. Thoracic spine degenerative changes. IMPRESSION: 1. No acute abnormality. 2. Mild changes of COPD and chronic bronchitis. 3. Aortic atherosclerosis. Electronically Signed   By: Claudie Revering M.D.   On: 12/07/2016 09:32   Procedures Procedures   Medications Ordered in ED Medications  aspirin chewable tablet 243 mg (not administered)  pantoprazole (PROTONIX) EC tablet 40 mg (not administered)  diltiazem (CARDIZEM) tablet 30 mg (not administered)  clopidogrel (PLAVIX)  tablet 75 mg (not administered)  lisinopril (PRINIVIL,ZESTRIL) tablet 10 mg (not administered)  carvedilol (COREG) tablet 25 mg (not administered)  isosorbide mononitrate (IMDUR) 24 hr tablet 30 mg (not administered)  zolpidem (AMBIEN) tablet 5 mg (not administered)  atorvastatin (LIPITOR) tablet 40 mg (not administered)  enoxaparin (LOVENOX) injection 30 mg (not administered)  acetaminophen (TYLENOL) tablet 650 mg (not administered)    Or  acetaminophen (TYLENOL) suppository 650 mg (not administered)  potassium chloride SA (K-DUR,KLOR-CON) CR tablet 40 mEq (not administered)  potassium chloride 10 mEq in 100 mL IVPB (not administered)  ibuprofen (ADVIL,MOTRIN) 400 MG tablet (not administered)  potassium chloride SA (K-DUR,KLOR-CON) CR tablet 40 mEq (40 mEq Oral Given 12/07/16 1027)  ibuprofen (ADVIL,MOTRIN) tablet 400 mg (400 mg Oral Given 12/07/16 1218)    Initial Impression / Assessment and Plan / ED Course  I have reviewed the triage vital signs and the nursing notes.  Pertinent labs & imaging results that were available during my care of the patient were reviewed by me and considered in my medical decision making (see chart for details).     Patient is hemodynamically stable. I discussed test results with the cardiologist on call, Specifically elevated troponin and low potassium.Marland Kitchen He recommended admission for observation.  Final Clinical Impressions(s) / ED Diagnoses   Final diagnoses:  Tachycardia  Elevated troponin  Hypokalemia   New Prescriptions New Prescriptions   No medications on file  I personally performed the services described in this documentation, which was scribed in my presence. The recorded information has been reviewed and is accurate.      Nat Christen, MD 12/07/16 [place], MD 12/07/16 [place], MD 12/07/16 [phone]

## 2016-12-07 NOTE — ED Notes (Signed)
CRITICAL VALUE ALERT  Critical value received:  Troponin 0.32  Date of notification:  12/07/2016  Time of notification:  1165  Critical value read back:  Yes  Nurse who received alert:  Cena Benton  MD notified (1st page):  Lacinda Axon  Time of first page:  1002  Responding MD:  Lacinda Axon  Time MD responded:  7903

## 2016-12-08 DIAGNOSIS — R748 Abnormal levels of other serum enzymes: Secondary | ICD-10-CM

## 2016-12-08 DIAGNOSIS — I471 Supraventricular tachycardia: Secondary | ICD-10-CM | POA: Diagnosis not present

## 2016-12-08 DIAGNOSIS — R0789 Other chest pain: Secondary | ICD-10-CM

## 2016-12-08 DIAGNOSIS — E876 Hypokalemia: Secondary | ICD-10-CM | POA: Diagnosis not present

## 2016-12-08 DIAGNOSIS — I2510 Atherosclerotic heart disease of native coronary artery without angina pectoris: Secondary | ICD-10-CM | POA: Diagnosis not present

## 2016-12-08 DIAGNOSIS — R000 Tachycardia, unspecified: Secondary | ICD-10-CM | POA: Diagnosis not present

## 2016-12-08 LAB — CBC
HEMATOCRIT: 35.9 % — AB (ref 36.0–46.0)
HEMOGLOBIN: 11.4 g/dL — AB (ref 12.0–15.0)
MCH: 27.7 pg (ref 26.0–34.0)
MCHC: 31.8 g/dL (ref 30.0–36.0)
MCV: 87.3 fL (ref 78.0–100.0)
Platelets: 143 10*3/uL — ABNORMAL LOW (ref 150–400)
RBC: 4.11 MIL/uL (ref 3.87–5.11)
RDW: 17 % — ABNORMAL HIGH (ref 11.5–15.5)
WBC: 3.8 10*3/uL — AB (ref 4.0–10.5)

## 2016-12-08 LAB — BASIC METABOLIC PANEL
ANION GAP: 7 (ref 5–15)
BUN: 9 mg/dL (ref 6–20)
CHLORIDE: 107 mmol/L (ref 101–111)
CO2: 28 mmol/L (ref 22–32)
Calcium: 8.8 mg/dL — ABNORMAL LOW (ref 8.9–10.3)
Creatinine, Ser: 0.79 mg/dL (ref 0.44–1.00)
GFR calc Af Amer: >60 mL/min (ref >60)
GFR calc non Af Amer: >60 mL/min (ref >60)
Glucose, Bld: 105 mg/dL — ABNORMAL HIGH (ref 65–99)
POTASSIUM: 4.4 mmol/L (ref 3.5–5.1)
SODIUM: 142 mmol/L (ref 135–145)

## 2016-12-08 LAB — LIPID PANEL
CHOL/HDL RATIO: 3.7 ratio
CHOLESTEROL: 206 mg/dL — AB (ref 0–200)
HDL: 56 mg/dL (ref >40)
LDL CALC: 123 mg/dL — AB (ref 0–99)
TRIGLYCERIDES: 136 mg/dL (ref <150)
VLDL: 27 mg/dL (ref 0–40)

## 2016-12-08 LAB — VITAMIN B12: Vitamin B-12: 298 pg/mL (ref 180–914)

## 2016-12-08 LAB — MAGNESIUM: Magnesium: 2 mg/dL (ref 1.7–2.4)

## 2016-12-08 MED ORDER — ENOXAPARIN SODIUM 40 MG/0.4ML ~~LOC~~ SOLN: 40.0000 mg | SUBCUTANEOUS | Status: DC

## 2016-12-08 NOTE — Care Management Obs Status (Signed)
[facility] NOTIFICATION   Patient Details  Name: [name] MRN: [id number] Date of Birth: Mar 14, 1941   Medicare Observation Status Notification Given:  No (discharged within 24 hours )    Deissy Guilbert, Chauncey [place], RN 12/08/2016, 9:11 AM

## 2016-12-08 NOTE — Progress Notes (Signed)
Pt discharged home today per Dr. Carles Collet.  Pt's IV site D/C'd and WDL.  Pt's VSS.  Pt provided with home medication list and discharge instructions.  Verbalized understanding.  Pt left floor via WC in stable condition accompanied by RN.

## 2016-12-08 NOTE — Discharge Summary (Signed)
Physician Discharge Summary  [name] KZL:[id number] DOB: Jan 16, 1941 DOA: 12/07/2016  PCP: Glo Herring, MD  Admit date: 12/07/2016 Discharge date: 12/08/2016  Admitted From: Home Disposition:  Home   Recommendations for Outpatient Follow-up:  1. Follow up with PCP in 1-2 weeks 2. Please obtain BMP/CBC in one week 3. Follow up with cardiology, Jory Sims on 12/09/16 @ 3pm     Discharge Condition: Stable CODE STATUS:FULL Diet recommendation: Heart Healthy   Brief/Interim Summary: 76 y.o. female with medical history of coronary artery disease, hypertension, aortic insufficiency, SVT, and NASH presenting with chest pain and heart racing sensation. The patient began feeling that her heart was racing around 11 PM on 12/06/2016 while she was sitting in bed watching television. She continued to have the heart racing sensation throughout the night. Around 7:30 AM on 12/07/2016, the patient developed right sharp chest pain radiating to her right shoulder that lasted approximately one hour. She had associated shortness of breath but no nausea, vomiting, or diaphoresis. The patient was recently discharged from the hospital after a stay from 11/02/2016 through 11/04/2016 for a similar presentation when her troponin peaked at 1.33.  She was felt to have had an NSTEMI related to demand ischemia. Her carvedilol was increased to 25 mg twice a day. The patient endorses compliance with all her medications. She denies any new medications or over-the-counter medications. In the emergency department, the patient was chest pain-free. Her troponins were cycled and although remained mildly elevated, the trend was flat. The patient was seen by cardiology.  Dr. Johnsie Cancel did not feel pt had ACS.  He recommended outpatient MRI to assess for myocarditis/infiltrative disease and to see if there is any eveidence of cardiac inflammation to account for rise in troponins.  He felt the patient was safe to go home without  any further cardiac testing. The patient has an outpatient appointment with cardiology on 12/09/2016.  Discharge Diagnoses:  Atypical chest pain/Elevated troponin -suspect elevated troponin is due to pt's tachycardia -do not feel pt has ACS -EKG--sinus without concerning ischemic changes -cycle troponins--remained flat -09/26/2016 and heart catheterization--minimal CAD, patent RCA stent, mild AS, EF 55-65%. -11/03/2016 echo EF 65-70%, no WMA, mild-mod AI -Continue Imdur and carvedilol -Continue aspirin and Plavix -appreciate cardiology consult-->cleared for discharge for outpt cardiac MRI  SVT -no episodes in ED -monitor on telemetry--no episodes during hospitalization -Continue carvedilol and diltiazem  Hypokalemia -Repleted with the goal of keeping K>4 -check mag--2.0  Coronary artery disease --09/26/2016 and heart catheterization--minimal CAD, patent RCA stent, mild AS, EF 55-65%. -Continue aspirin and Plavix -Continue statin  Thrombocytopenia -this has been chronic -check B12--pending at the time of discharge  Hyperlipidemia -Continue statin  Diabetes mellitus type 2 -Now well controlled with diet -11/02/2016 hemoglobin A1c 5.4   Discharge Instructions  Discharge Instructions    Diet - low sodium heart healthy    Complete by:  As directed    Increase activity slowly    Complete by:  As directed      Allergies as of 12/08/2016      Reactions   Tape Rash      Medication List    TAKE these medications   acetaminophen 500 MG tablet Commonly known as:  TYLENOL Take 1,000 mg by mouth 2 (two) times daily.   aspirin 81 MG chewable tablet Chew 243 mg by mouth at bedtime.   atorvastatin 40 MG tablet Commonly known as:  LIPITOR Take 40 mg by mouth at bedtime.   carvedilol 25 MG  tablet Commonly known as:  COREG Take 1 tablet (25 mg total) by mouth 2 (two) times daily.   clopidogrel 75 MG tablet Commonly known as:  PLAVIX Take 1 tablet (75 mg  total) by mouth daily. What changed:  when to take this   diltiazem 30 MG tablet Commonly known as:  CARDIZEM Take 1 tablet (30 mg total) by mouth 2 (two) times daily.   ibuprofen 200 MG tablet Commonly known as:  ADVIL,MOTRIN Take 400 mg by mouth 2 (two) times daily.   isosorbide mononitrate 30 MG 24 hr tablet Commonly known as:  IMDUR Take 30 mg by mouth at bedtime.   lisinopril 20 MG tablet Commonly known as:  PRINIVIL,ZESTRIL Take 0.5 tablets (10 mg total) by mouth daily. What changed:  when to take this   ondansetron 8 MG disintegrating tablet Commonly known as:  ZOFRAN ODT Take 1 tablet (8 mg total) by mouth every 8 (eight) hours as needed for nausea or vomiting.   pantoprazole 40 MG tablet Commonly known as:  PROTONIX Take 40 mg by mouth at bedtime.   zolpidem 10 MG tablet Commonly known as:  AMBIEN Take 10 mg by mouth at bedtime.       Allergies  Allergen Reactions  . Tape Rash    Consultations:  cardiology   Procedures/Studies: Dg Chest 2 View  Result Date: 12/07/2016 CLINICAL DATA:  Chest pain and shortness of breath. EXAM: CHEST  2 VIEW COMPARISON:  11/08/2016. FINDINGS: Normal sized heart. Clear lungs. Mildly tortuous and calcified thoracic aorta. Prominent AP diameter of the chest and mild diffuse peribronchial thickening. Thoracic spine degenerative changes. IMPRESSION: 1. No acute abnormality. 2. Mild changes of COPD and chronic bronchitis. 3. Aortic atherosclerosis. Electronically Signed   By: Claudie Revering M.D.   On: 12/07/2016 09:32        Discharge Exam: Vitals:   12/07/16 2208 12/08/16 0628  BP: (!) 187/62 (!) 149/66  Pulse: 84 79  Resp: 17 18  Temp: 98.4 F (36.9 C) 98.4 F (36.9 C)   Vitals:   12/07/16 1400 12/07/16 1531 12/07/16 2208 12/08/16 0628  BP: (!) 151/52 (!) 161/55 (!) 187/62 (!) 149/66  Pulse: 84 85 84 79  Resp: 17  17 18   Temp:  98.3 F (36.8 C) 98.4 F (36.9 C) 98.4 F (36.9 C)  TempSrc:  Oral Oral Oral  SpO2:  97% 100% 98% 99%  Weight:  63 kg (139 lb)    Height:  5' 3"  (1.6 m)      General: Pt is alert, awake, not in acute distress Cardiovascular: RRR, S1/S2 +, no rubs, no gallops Respiratory: CTA bilaterally, no wheezing, no rhonchi Abdominal: Soft, NT, ND, bowel sounds + Extremities: no edema, no cyanosis   The results of significant diagnostics from this hospitalization (including imaging, microbiology, ancillary and laboratory) are listed below for reference.    Significant Diagnostic Studies: Dg Chest 2 View  Result Date: 12/07/2016 CLINICAL DATA:  Chest pain and shortness of breath. EXAM: CHEST  2 VIEW COMPARISON:  11/08/2016. FINDINGS: Normal sized heart. Clear lungs. Mildly tortuous and calcified thoracic aorta. Prominent AP diameter of the chest and mild diffuse peribronchial thickening. Thoracic spine degenerative changes. IMPRESSION: 1. No acute abnormality. 2. Mild changes of COPD and chronic bronchitis. 3. Aortic atherosclerosis. Electronically Signed   By: Claudie Revering M.D.   On: 12/07/2016 09:32     Microbiology: No results found for this or any previous visit (from the past 240 hour(s)).   Labs:  Basic Metabolic Panel:  Recent Labs Lab 12/07/16 0900 12/08/16 0554  NA 141 142  K 2.6* 4.4  CL 104 107  CO2 28 28  GLUCOSE 117* 105*  BUN 11 9  CREATININE 0.72 0.79  CALCIUM 8.8* 8.8*  MG 2.0 2.0   Liver Function Tests: No results for input(s): AST, ALT, ALKPHOS, BILITOT, PROT, ALBUMIN in the last 168 hours. No results for input(s): LIPASE, AMYLASE in the last 168 hours. No results for input(s): AMMONIA in the last 168 hours. CBC:  Recent Labs Lab 12/07/16 0900 12/08/16 0554  WBC 3.6* 3.8*  HGB 11.4* 11.4*  HCT 34.8* 35.9*  MCV 85.1 87.3  PLT 149* 143*   Cardiac Enzymes:  Recent Labs Lab 12/07/16 0900 12/07/16 1437 12/07/16 2308  TROPONINI 0.32* 0.29* 0.28*   BNP: Invalid input(s): POCBNP CBG: No results for input(s): GLUCAP in the last 168  hours.  Time coordinating discharge:  Greater than 30 minutes  Signed:  Dylan Monforte, DO [facility] Pager: [phone] 12/08/2016, 8:54 AM

## 2016-12-08 NOTE — Consult Note (Signed)
CARDIOLOGY CONSULT NOTE       Patient ID: [name] MRN: [id number] DOB/AGE: 1941-06-08 76 y.o.  Admit date: 12/07/2016 Referring Physician: Tat Primary Physician: Glo Herring, MD Primary Cardiologist: Branch Reason for Consultation: Chest Pain and Tachycardia  Active Problems:   CAD (coronary artery disease)   PSVT (paroxysmal supraventricular tachycardia) (HCC)   Thrombocytopenia (HCC)   Hypokalemia   Elevated troponin   Chest pain   Atypical chest pain   HPI:  76 y.o. asked to consult/evaluate for chest pain and tachycardia by Dr Carles Collet. She has been w/u extensively recently for same Reviewed her cath films 09/26/16 and no significant CAD LV normal with no RWMA;s Had pain and tachycardia then with positive Troponin. Echo with mild AS and life long murmur. She has had a monitor 11/11/16 with no significant arrhythmias. Last night she Had sensation of heart racing starting at 11:00 pm continued most of night then in am had sharp right sided chest pain. Not positional Or pleuritic Pain subsided in ER. No dyspnea, syncope She denies anxiety. Compliant with meds Coreg dose had been increased for  Palpitations in past Labs have been fine with no anemia , signs of infection and TSH normal 09/23/17  Troponin this admission mildly elevated as well at .29. This am she is asymptomatic and looks great  ROS All other systems reviewed and negative except as noted above  Past Medical History:  Diagnosis Date  . Anemia   . Aortic insufficiency    Moderate  . Back pain   . Carotid stenosis, bilateral   . Cirrhosis ([place])   . Coronary artery disease    Stent x 2 RCA 1995, cardiac catheterization 09/2016 showing only mild atherosclerosis  . DDD (degenerative disc disease), lumbar   . Essential hypertension   . GERD (gastroesophageal reflux disease)   . Hiatal hernia   . History of stroke   . Hypercholesteremia   . IBS (irritable bowel syndrome)   . Myocardial infarction 1995  .  Non-alcoholic fatty liver disease   . OSA (obstructive sleep apnea)    Sleep study 2009  AHI 9.91/hr and during REM sleep 35.58/hr  . Pericardial effusion    a. small by echo 2018.  Marland Kitchen PSVT (paroxysmal supraventricular tachycardia) ([place])   . Recurrent UTI   . Type 2 diabetes mellitus ([place])   . Varices, esophageal (HCC)     Family History  Problem Relation Age of Onset  . Diabetes Mother   . Heart failure Father   . Heart failure Maternal Aunt     Social History   Social History  . Marital status: Widowed    Spouse name: N/A  . Number of children: 2  . Years of education: N/A   Occupational History  .  Retired   Social History Main Topics  . Smoking status: Former Smoker    Packs/day: 0.25    Years: 50.00    Types: Cigarettes    Quit date: 01/13/2012  . Smokeless tobacco: Never Used  . Alcohol use No  . Drug use: No  . Sexual activity: Yes    Birth control/ protection: Surgical   Other Topics Concern  . Not on file   Social History Narrative  . No narrative on file    Past Surgical History:  Procedure Laterality Date  . [place]  . APPENDECTOMY    . BACK SURGERY  1985  . CARDIAC CATHETERIZATION  [phone]   Stent to the proximal  RCA after MI   . CARDIAC CATHETERIZATION N/A 09/26/2016   Procedure: Left Heart Cath and Coronary Angiography;  Surgeon: Leonie Man, MD;  Location: [facility];  Service: Cardiovascular;  Laterality: N/A;  . CATARACT EXTRACTION W/PHACO Right 07/04/2014   Procedure: CATARACT EXTRACTION PHACO AND INTRAOCULAR LENS PLACEMENT (IOC);  Surgeon: Elta Guadeloupe T. Gershon Crane, MD;  Location: [facility];  Service: Ophthalmology;  Laterality: Right;  CDE:13.13  . COLONOSCOPY    . DILATION AND CURETTAGE OF UTERUS     x2  . Epi Retinal Membrane Peel Left   . ERCP    . ESOPHAGEAL BANDING N/A 04/01/2013   Procedure: ESOPHAGEAL BANDING;  Surgeon: Rogene Houston, MD;  Location: [facility];  Service: Endoscopy;  Laterality: N/A;  .  ESOPHAGEAL BANDING N/A 05/24/2013   Procedure: ESOPHAGEAL BANDING;  Surgeon: Rogene Houston, MD;  Location: [facility];  Service: Endoscopy;  Laterality: N/A;  . ESOPHAGEAL BANDING N/A 06/21/2014   Procedure: ESOPHAGEAL BANDING;  Surgeon: Rogene Houston, MD;  [facility];  Service: Endoscopy;  Laterality: N/A;  . ESOPHAGOGASTRODUODENOSCOPY N/A 04/01/2013   Procedure: ESOPHAGOGASTRODUODENOSCOPY (EGD);  Surgeon: Rogene Houston, MD;  [facility];  Service: Endoscopy;  Laterality: N/A;  230-rescheduled to 8:30am Ann notified pt  . ESOPHAGOGASTRODUODENOSCOPY N/A 05/24/2013   Procedure: ESOPHAGOGASTRODUODENOSCOPY (EGD);  Surgeon: Rogene Houston, MD;  [facility];  Service: Endoscopy;  Laterality: N/A;  730  . ESOPHAGOGASTRODUODENOSCOPY N/A 06/21/2014   Procedure: ESOPHAGOGASTRODUODENOSCOPY (EGD);  Surgeon: Rogene Houston, MD;  [facility];  Service: Endoscopy;  Laterality: N/A;  930-rescheduled 10/14 @ 1200 Ann to notify pt  . EYE SURGERY  08   cataract surgery of the left eye  . HARDWARE REMOVAL Right 01/17/2013   Procedure: REMOVAL OF HARDWARE AND EXCISION ULNAR STYLOID RIGHT WRIST;  Surgeon: Tennis Must, MD;  Location: [facility];  Service: Orthopedics;  Laterality: Right;  . MYRINGOTOMY  2012   both ears  . TONSILLECTOMY    . VAGINAL HYSTERECTOMY  1972  . WRIST SURGERY     rt wrist hardwear removal     . aspirin  243 mg Oral QHS  . atorvastatin  40 mg Oral QHS  . carvedilol  25 mg Oral BID  . clopidogrel  75 mg Oral QHS  . diltiazem  30 mg Oral BID  . enoxaparin (LOVENOX) injection  30 mg Subcutaneous Q24H  . isosorbide mononitrate  30 mg Oral QHS  . lisinopril  10 mg Oral QHS  . pantoprazole  40 mg Oral QHS  . potassium chloride  40 mEq Oral Daily  . zolpidem  5 mg Oral QHS     Physical Exam: Blood pressure (!) 149/66, pulse 79, temperature 98.4 F (36.9 C), temperature source Oral, resp. rate 18, height 5' 3"   (1.6 m), weight 139 lb (63 kg), SpO2 99 %.    Affect appropriate Healthy:  appears stated age 62: normal Neck supple with no adenopathy JVP normal no bruits no thyromegaly Lungs clear with no wheezing and good diaphragmatic motion Heart:  S1/S2 mild AS murmur, no rub, gallop or click PMI normal Abdomen: benighn, BS positve, no tenderness, no AAA no bruit.  No HSM or HJR Distal pulses intact with no bruits No edema Neuro non-focal Skin warm and dry No muscular weakness   Labs:   Lab Results  Component Value Date   WBC 3.8 (L) 12/08/2016   HGB 11.4 (L)  12/08/2016   HCT 35.9 (L) 12/08/2016   MCV 87.3 12/08/2016   PLT 143 (L) 12/08/2016    Recent Labs Lab 12/08/16 0554  NA 142  K 4.4  CL 107  CO2 28  BUN 9  CREATININE 0.79  CALCIUM 8.8*  GLUCOSE 105*   Lab Results  Component Value Date   CKTOTAL 107 07/04/2011   CKMB 3.6 07/04/2011   TROPONINI 0.28 (HH) 12/07/2016    Lab Results  Component Value Date   CHOL 206 (H) 12/08/2016   CHOL 148 11/03/2016   CHOL 127 05/06/2016   Lab Results  Component Value Date   HDL 56 12/08/2016   HDL 46 11/03/2016   HDL 40 (L) 05/06/2016   Lab Results  Component Value Date   LDLCALC 123 (H) 12/08/2016   LDLCALC 81 11/03/2016   LDLCALC 64 05/06/2016   Lab Results  Component Value Date   TRIG 136 12/08/2016   TRIG 103 11/03/2016   TRIG 117 05/06/2016   Lab Results  Component Value Date   CHOLHDL 3.7 12/08/2016   CHOLHDL 3.2 11/03/2016   CHOLHDL 3.2 05/06/2016   No results found for: LDLDIRECT    Radiology: Dg Chest 2 View  Result Date: 12/07/2016 CLINICAL DATA:  Chest pain and shortness of breath. EXAM: CHEST  2 VIEW COMPARISON:  11/08/2016. FINDINGS: Normal sized heart. Clear lungs. Mildly tortuous and calcified thoracic aorta. Prominent AP diameter of the chest and mild diffuse peribronchial thickening. Thoracic spine degenerative changes. IMPRESSION: 1. No acute abnormality. 2. Mild changes of COPD and  chronic bronchitis. 3. Aortic atherosclerosis. Electronically Signed   By: Claudie Revering M.D.   On: 12/07/2016 09:32    EKG: NSR normal    ASSESSMENT AND PLAN:  Chest Pain: atypical cath January normal no further w/u indicated continue ASA and beta blocker Elevated Troponin seems chronic with negative CPK/MB doubt cardiac etiology. Will arrange outpatient MRI with gadolinium to assess for infiltration/myocarditis and see if there is any evidence of cardiac inflammation To account for rise Palpitations: No arrhythmia on recent monitor , in ER or on floor continue beta blocker labs ok  D/c home has f/u with Leonia Reader NP in am  Signed: Jenkins Rouge 12/08/2016, 7:43 AM

## 2016-12-09 ENCOUNTER — Encounter: Payer: Self-pay | Admitting: Adult Health

## 2016-12-09 ENCOUNTER — Encounter (INDEPENDENT_AMBULATORY_CARE_PROVIDER_SITE_OTHER): Payer: Self-pay | Admitting: *Deleted

## 2016-12-09 ENCOUNTER — Ambulatory Visit (INDEPENDENT_AMBULATORY_CARE_PROVIDER_SITE_OTHER): Payer: Medicare Other | Admitting: Adult Health

## 2016-12-09 VITALS — BP 180/68 | HR 99 | Ht 63.0 in | Wt 140.0 lb

## 2016-12-09 DIAGNOSIS — I308 Other forms of acute pericarditis: Secondary | ICD-10-CM

## 2016-12-09 DIAGNOSIS — E876 Hypokalemia: Secondary | ICD-10-CM | POA: Diagnosis not present

## 2016-12-09 DIAGNOSIS — I208 Other forms of angina pectoris: Secondary | ICD-10-CM | POA: Diagnosis not present

## 2016-12-09 DIAGNOSIS — I10 Essential (primary) hypertension: Secondary | ICD-10-CM

## 2016-12-09 DIAGNOSIS — I471 Supraventricular tachycardia: Secondary | ICD-10-CM | POA: Diagnosis not present

## 2016-12-09 MED ORDER — ZOLPIDEM TARTRATE 10 MG PO TABS: 10.0000 mg | ORAL_TABLET | Freq: Every day | ORAL | 0 refills | Status: DC

## 2016-12-09 MED ORDER — POTASSIUM CHLORIDE CRYS ER 20 MEQ PO TBCR: 20.0000 meq | EXTENDED_RELEASE_TABLET | Freq: Every day | ORAL | 3 refills | Status: DC

## 2016-12-09 MED ORDER — DILTIAZEM HCL ER COATED BEADS 120 MG PO CP24: 120.0000 mg | ORAL_CAPSULE | Freq: Every day | ORAL | 6 refills | Status: DC

## 2016-12-09 NOTE — Patient Instructions (Addendum)
Your physician recommends that you schedule a follow-up appointment in: 1 month.   Your physician has recommended you make the following change in your medication:   Cardizem CD 120 mg Take 1 Tablet Daily at Bedtime Potassium 20 mEq Daily    Your physician has requested that you have a cardiac MRI. Cardiac MRI uses a computer to create images of your heart as its beating, producing both still and moving pictures of your heart and major blood vessels. For further information please visit [email or web address] Please follow the instruction sheet given to you today for more information.  Your physician recommends that you schedule a follow-up appointment in: 1 Week for a Blood pressure check.   If you need a refill on your cardiac medications before your next appointment, please call your pharmacy.  Thank you for choosing [facility]!

## 2016-12-09 NOTE — Progress Notes (Signed)
Name: [name]    DOB: 1941/03/23  Age: 76 y.o.  MR#: [id number]       PCP:  Glo Herring, MD      Insurance: Payor: Onnie Boer MEDICARE / Plan: [facility] MEDICARE / Product Type: *No Product type* /   CC:   No chief complaint on file.   VS Vitals:   12/09/16 1447  BP: (!) 180/68  Pulse: 99  SpO2: 97%  Weight: 140 lb (63.5 kg)  Height: 5' 3"  (1.6 m)    Weights Current Weight  12/09/16 140 lb (63.5 kg)  12/07/16 139 lb (63 kg)  11/11/16 142 lb (64.4 kg)    Blood Pressure  BP Readings from Last 3 Encounters:  12/09/16 (!) 180/68  12/08/16 (!) 149/66  11/11/16 138/64     Admit date:  (Not on file) Last encounter with RMR:  11/13/2016   Allergy Tape  Current Outpatient Prescriptions  Medication Sig Dispense Refill  . acetaminophen (TYLENOL) 500 MG tablet Take 1,000 mg by mouth 2 (two) times daily.    Marland Kitchen aspirin 81 MG chewable tablet Chew 243 mg by mouth at bedtime.    Marland Kitchen atorvastatin (LIPITOR) 40 MG tablet Take 40 mg by mouth at bedtime.     . carvedilol (COREG) 25 MG tablet Take 1 tablet (25 mg total) by mouth 2 (two) times daily. 60 tablet 1  . clopidogrel (PLAVIX) 75 MG tablet Take 1 tablet (75 mg total) by mouth daily. (Patient taking differently: Take 75 mg by mouth at bedtime. ) 30 tablet 2  . diltiazem (CARDIZEM) 30 MG tablet Take 1 tablet (30 mg total) by mouth 2 (two) times daily. 60 tablet 6  . ibuprofen (ADVIL,MOTRIN) 200 MG tablet Take 400 mg by mouth 2 (two) times daily.     . isosorbide mononitrate (IMDUR) 30 MG 24 hr tablet Take 30 mg by mouth at bedtime.     Marland Kitchen lisinopril (PRINIVIL,ZESTRIL) 20 MG tablet Take 0.5 tablets (10 mg total) by mouth daily. (Patient taking differently: Take 10 mg by mouth at bedtime. ) 30 tablet 6  . ondansetron (ZOFRAN ODT) 8 MG disintegrating tablet Take 1 tablet (8 mg total) by mouth every 8 (eight) hours as needed for nausea or vomiting. 20 tablet 0  . pantoprazole (PROTONIX) 40 MG tablet Take 40 mg by mouth at bedtime.    Marland Kitchen  zolpidem (AMBIEN) 10 MG tablet Take 10 mg by mouth at bedtime.     No current facility-administered medications for this visit.     Discontinued Meds:   There are no discontinued medications.  Patient Active Problem List   Diagnosis Date Noted  . Chest pain 12/07/2016  . Atypical chest pain 12/07/2016  . Tachycardia   . NSTEMI (non-ST elevated myocardial infarction) ([place]) 11/02/2016  . Multifocal atrial tachycardia ([place]) 09/25/2016  . Elevated troponin 09/24/2016  . Hypokalemia 09/23/2016  . TIA (transient ischemic attack) 05/05/2016  . Intractable nausea and vomiting 10/15/2015  . Acute coronary syndrome ([place]) 06/01/2015  . Bronchitis 06/01/2015  . Thrombocytopenia ([place]) 06/01/2015  . CVA (cerebral infarction) 03/06/2014  . UTI (lower urinary tract infection)   . PSVT (paroxysmal supraventricular tachycardia) ([place]) 12/09/2013  . Esophageal varices ([place]) 03/29/2013  . Hematemesis 03/29/2013  . Hepatic cirrhosis ([place]) 03/29/2013  . Presence of stent in right coronary artery 07/04/2011  . OSA (obstructive sleep apnea) 07/04/2011  . Aortic sclerosis 07/04/2011  . Aortic insufficiency 07/04/2011  . HTN (hypertension) 07/04/2011  . Carotid stenosis, bilateral 07/04/2011  .  Chest pain at rest 07/03/2011  . CAD (coronary artery disease) 07/03/2011  . DM type 2 (diabetes mellitus, type 2) ([place]) 07/03/2011  . Hypercholesteremia 07/03/2011  . GERD (gastroesophageal reflux disease) 07/03/2011    LABS    Component Value Date/Time   NA 142 12/08/2016 0554   NA 141 12/07/2016 0900   NA 141 11/08/2016 0424   K 4.4 12/08/2016 0554   K 2.6 (LL) 12/07/2016 0900   K 3.7 11/08/2016 0424   CL 107 12/08/2016 0554   CL 104 12/07/2016 0900   CL 108 11/08/2016 0424   CO2 28 12/08/2016 0554   CO2 28 12/07/2016 0900   CO2 22 11/08/2016 0424   GLUCOSE 105 (H) 12/08/2016 0554   GLUCOSE 117 (H) 12/07/2016 0900   GLUCOSE 101 (H) 11/08/2016 0424   BUN 9 12/08/2016 0554   BUN 11 12/07/2016  0900   BUN 16 11/08/2016 0424   CREATININE 0.79 12/08/2016 0554   CREATININE 0.72 12/07/2016 0900   CREATININE 0.98 11/08/2016 0424   CREATININE 1.12 (H) 12/15/2013 1037   CALCIUM 8.8 (L) 12/08/2016 0554   CALCIUM 8.8 (L) 12/07/2016 0900   CALCIUM 9.4 11/08/2016 0424   GFRNONAA >60 12/08/2016 0554   GFRNONAA >60 12/07/2016 0900   GFRNONAA 55 (L) 11/08/2016 0424   GFRNONAA 50 (L) 12/15/2013 1037   GFRAA >60 12/08/2016 0554   GFRAA >60 12/07/2016 0900   GFRAA >60 11/08/2016 0424   GFRAA 57 (L) 12/15/2013 1037   CMP     Component Value Date/Time   NA 142 12/08/2016 0554   K 4.4 12/08/2016 0554   CL 107 12/08/2016 0554   CO2 28 12/08/2016 0554   GLUCOSE 105 (H) 12/08/2016 0554   BUN 9 12/08/2016 0554   CREATININE 0.79 12/08/2016 0554   CREATININE 1.12 (H) 12/15/2013 1037   CALCIUM 8.8 (L) 12/08/2016 0554   PROT 7.2 11/08/2016 0424   ALBUMIN 3.9 11/08/2016 0424   AST 32 11/08/2016 0424   ALT 22 11/08/2016 0424   ALKPHOS 69 11/08/2016 0424   BILITOT 0.5 11/08/2016 0424   GFRNONAA >60 12/08/2016 0554   GFRNONAA 50 (L) 12/15/2013 1037   GFRAA >60 12/08/2016 0554   GFRAA 57 (L) 12/15/2013 1037       Component Value Date/Time   WBC 3.8 (L) 12/08/2016 0554   WBC 3.6 (L) 12/07/2016 0900   WBC 5.5 11/08/2016 0424   HGB 11.4 (L) 12/08/2016 0554   HGB 11.4 (L) 12/07/2016 0900   HGB 12.4 11/08/2016 0424   HCT 35.9 (L) 12/08/2016 0554   HCT 34.8 (L) 12/07/2016 0900   HCT 37.5 11/08/2016 0424   MCV 87.3 12/08/2016 0554   MCV 85.1 12/07/2016 0900   MCV 84.1 11/08/2016 0424    Lipid Panel     Component Value Date/Time   CHOL 206 (H) 12/08/2016 0554   CHOL 142 12/07/2013 1038   TRIG 136 12/08/2016 0554   TRIG 98 12/07/2013 1038   HDL 56 12/08/2016 0554   HDL 47 12/07/2013 1038   CHOLHDL 3.7 12/08/2016 0554   VLDL 27 12/08/2016 0554   LDLCALC 123 (H) 12/08/2016 0554   LDLCALC 75 12/07/2013 1038    ABG    Component Value Date/Time   TCO2 25 05/05/2016 0846      Lab Results  Component Value Date   TSH 0.503 09/23/2016   BNP (last 3 results) No results for input(s): BNP in the last 8760 hours.  ProBNP (last 3 results) No results for input(s):  PROBNP in the last 8760 hours.  Cardiac Panel (last 3 results)  Recent Labs  12/07/16 0900 12/07/16 1437 12/07/16 2308  TROPONINI 0.32* 0.29* 0.28*    Iron/TIBC/Ferritin/ %Sat No results found for: IRON, TIBC, FERRITIN, IRONPCTSAT   EKG Orders placed or performed during the hospital encounter of 12/07/16  . ED EKG within 10 minutes  . ED EKG within 10 minutes  . EKG 12-Lead  . EKG 12-Lead  . EKG     Prior Assessment and Plan Problem List as of 12/09/2016 Reviewed: 11/11/2016  5:57 PM by Jory Sims, NP     Cardiovascular and Mediastinum   CAD (coronary artery disease)   Last Assessment & Plan 12/09/2013 Office Visit Written 12/09/2013  5:35 PM by Isaiah Serge, NP    Hx of RCA stents 1995, last cath 2007.  Minimal CAD at that time.  Last nuc 2012 negative for ischemia.  With her risk factors will proceed with Lexiscan myoview.      Aortic sclerosis   Last Assessment & Plan 12/09/2013 Office Visit Written 12/09/2013  5:34 PM by Isaiah Serge, NP    stable      Aortic insufficiency   HTN (hypertension)   Last Assessment & Plan 12/09/2013 Office Visit Written 12/09/2013  5:37 PM by Isaiah Serge, NP    BP elevated, I increased her coreg to 12.5 mg BID, she will call if decreased BP        Carotid stenosis, bilateral   Last Assessment & Plan 12/09/2013 Office Visit Written 12/09/2013  5:39 PM by Isaiah Serge, NP    stable      Esophageal varices ([place])   PSVT (paroxysmal supraventricular tachycardia) [facility])   Last Assessment & Plan 12/09/2013 Office Visit Written 12/09/2013  5:33 PM by Isaiah Serge, NP    Episode of SVT rate of 142 this week.  Has not felt well since, + weakness, + teeth aching with the episode could be anginal equivalent.  Will schedule a lexiscan myoview for next week she  requested it be done in [place].  She will follow up with Dr. Debara Pickett for results, though in the future she may request the [place] office to be seen.        Acute coronary syndrome (HCC)   TIA (transient ischemic attack)   Multifocal atrial tachycardia (HCC)   NSTEMI (non-ST elevated myocardial infarction) (HCC)     Respiratory   OSA (obstructive sleep apnea)   Bronchitis     Digestive   GERD (gastroesophageal reflux disease)   Hematemesis   Hepatic cirrhosis [facility])   Last Assessment & Plan 12/09/2013 Office Visit Written 12/09/2013  5:39 PM by Isaiah Serge, NP    stable        Endocrine   DM type 2 (diabetes mellitus, type 2) [facility])   Last Assessment & Plan 12/09/2013 Office Visit Written 12/09/2013  5:38 PM by Isaiah Serge, NP    Followed by PCP        Nervous and Auditory   CVA (cerebral infarction)     Genitourinary   UTI (lower urinary tract infection)     Other   Hypercholesteremia   Last Assessment & Plan 12/09/2013 Office Visit Written 12/09/2013  5:38 PM by Isaiah Serge, NP    Improved with recent labs.      Presence of stent in right coronary artery   Chest pain at rest   Thrombocytopenia (HCC)   Intractable nausea and vomiting  Hypokalemia   Elevated troponin   Chest pain   Atypical chest pain   Tachycardia       Imaging: Dg Chest 2 View  Result Date: 12/07/2016 CLINICAL DATA:  Chest pain and shortness of breath. EXAM: CHEST  2 VIEW COMPARISON:  11/08/2016. FINDINGS: Normal sized heart. Clear lungs. Mildly tortuous and calcified thoracic aorta. Prominent AP diameter of the chest and mild diffuse peribronchial thickening. Thoracic spine degenerative changes. IMPRESSION: 1. No acute abnormality. 2. Mild changes of COPD and chronic bronchitis. 3. Aortic atherosclerosis. Electronically Signed   By: Claudie Revering M.D.   On: 12/07/2016 09:32

## 2016-12-09 NOTE — Progress Notes (Signed)
Cardiology Office Note   Date:  12/09/2016   ID:  [name], DOB 11-Jun-1941, MRN [id number]  PCP:  Glo Herring, MD  Cardiologist:Branch\ Jory Sims, NP   No chief complaint on file.     History of Present Illness: [name] is a 76 y.o. female who presents for ongoing assessment and management of tachycardia with discharge from the hospital yesterday 12/08/2016 for chest pain. She was ruled out for ACS. She has normal cardiac cath in January of 2018. OP MRI with gadolinium was recommended to rule out myocarditis.   She comes today without complaints with the exception of frequent diarrhea from diltiazem. She states she feels awful on this medication. She states that she has not had any recurrent chest pain.  Since discharge. She was found to be hypokalemic during admission and was repleted.    Past Medical History:  Diagnosis Date  . Anemia   . Aortic insufficiency    Moderate  . Back pain   . Carotid stenosis, bilateral   . Cirrhosis ([place])   . Coronary artery disease    Stent x 2 RCA 1995, cardiac catheterization 09/2016 showing only mild atherosclerosis  . DDD (degenerative disc disease), lumbar   . Essential hypertension   . GERD (gastroesophageal reflux disease)   . Hiatal hernia   . History of stroke   . Hypercholesteremia   . IBS (irritable bowel syndrome)   . Myocardial infarction 1995  . Non-alcoholic fatty liver disease   . OSA (obstructive sleep apnea)    Sleep study 2009  AHI 9.91/hr and during REM sleep 35.58/hr  . Pericardial effusion    a. small by echo 2018.  Marland Kitchen PSVT (paroxysmal supraventricular tachycardia) ([place])   . Recurrent UTI   . Type 2 diabetes mellitus ([place])   . Varices, esophageal ([place])     Past Surgical History:  Procedure Laterality Date  . [place]  . APPENDECTOMY    . BACK SURGERY  1985  . CARDIAC CATHETERIZATION  [phone]   Stent to the proximal RCA after MI   . CARDIAC CATHETERIZATION N/A 09/26/2016    Procedure: Left Heart Cath and Coronary Angiography;  Surgeon: Leonie Man, MD;  Location: [facility];  Service: Cardiovascular;  Laterality: N/A;  . CATARACT EXTRACTION W/PHACO Right 07/04/2014   Procedure: CATARACT EXTRACTION PHACO AND INTRAOCULAR LENS PLACEMENT (IOC);  Surgeon: Elta Guadeloupe T. Gershon Crane, MD;  Location: [facility];  Service: Ophthalmology;  Laterality: Right;  CDE:13.13  . COLONOSCOPY    . DILATION AND CURETTAGE OF UTERUS     x2  . Epi Retinal Membrane Peel Left   . ERCP    . ESOPHAGEAL BANDING N/A 04/01/2013   Procedure: ESOPHAGEAL BANDING;  Surgeon: Rogene Houston, MD;  [facility];  Service: Endoscopy;  Laterality: N/A;  . ESOPHAGEAL BANDING N/A 05/24/2013   Procedure: ESOPHAGEAL BANDING;  Surgeon: Rogene Houston, MD;  [facility];  Service: Endoscopy;  Laterality: N/A;  . ESOPHAGEAL BANDING N/A 06/21/2014   Procedure: ESOPHAGEAL BANDING;  Surgeon: Rogene Houston, MD;  [facility];  Service: Endoscopy;  Laterality: N/A;  . ESOPHAGOGASTRODUODENOSCOPY N/A 04/01/2013   Procedure: ESOPHAGOGASTRODUODENOSCOPY (EGD);  Surgeon: Rogene Houston, MD;  [facility];  Service: Endoscopy;  Laterality: N/A;  230-rescheduled to 8:30am Ann notified pt  . ESOPHAGOGASTRODUODENOSCOPY N/A 05/24/2013   Procedure: ESOPHAGOGASTRODUODENOSCOPY (EGD);  Surgeon: Rogene Houston, MD;  [facility];  Service: Endoscopy;  Laterality: N/A;  730  . ESOPHAGOGASTRODUODENOSCOPY N/A 06/21/2014   Procedure: ESOPHAGOGASTRODUODENOSCOPY (EGD);  Surgeon: Rogene Houston, MD;  [facility];  Service: Endoscopy;  Laterality: N/A;  930-rescheduled 10/14 @ 1200 Ann to notify pt  . EYE SURGERY  08   cataract surgery of the left eye  . HARDWARE REMOVAL Right 01/17/2013   Procedure: REMOVAL OF HARDWARE AND EXCISION ULNAR STYLOID RIGHT WRIST;  Surgeon: Tennis Must, MD;  Location: [facility];  Service: Orthopedics;  Laterality: Right;   . MYRINGOTOMY  2012   both ears  . TONSILLECTOMY    . VAGINAL HYSTERECTOMY  1972  . WRIST SURGERY     rt wrist hardwear removal     Current Outpatient Prescriptions  Medication Sig Dispense Refill  . acetaminophen (TYLENOL) 500 MG tablet Take 1,000 mg by mouth 2 (two) times daily.    Marland Kitchen aspirin 81 MG chewable tablet Chew 243 mg by mouth at bedtime.    Marland Kitchen atorvastatin (LIPITOR) 40 MG tablet Take 40 mg by mouth at bedtime.     . carvedilol (COREG) 25 MG tablet Take 1 tablet (25 mg total) by mouth 2 (two) times daily. 60 tablet 1  . clopidogrel (PLAVIX) 75 MG tablet Take 1 tablet (75 mg total) by mouth daily. (Patient taking differently: Take 75 mg by mouth at bedtime. ) 30 tablet 2  . ibuprofen (ADVIL,MOTRIN) 200 MG tablet Take 400 mg by mouth 2 (two) times daily.     . isosorbide mononitrate (IMDUR) 30 MG 24 hr tablet Take 30 mg by mouth at bedtime.     Marland Kitchen lisinopril (PRINIVIL,ZESTRIL) 20 MG tablet Take 0.5 tablets (10 mg total) by mouth daily. (Patient taking differently: Take 10 mg by mouth at bedtime. ) 30 tablet 6  . ondansetron (ZOFRAN ODT) 8 MG disintegrating tablet Take 1 tablet (8 mg total) by mouth every 8 (eight) hours as needed for nausea or vomiting. 20 tablet 0  . pantoprazole (PROTONIX) 40 MG tablet Take 40 mg by mouth at bedtime.    Marland Kitchen zolpidem (AMBIEN) 10 MG tablet Take 1 tablet (10 mg total) by mouth at bedtime. Refills to be sent to PCP 10 tablet 0  . diltiazem (CARDIZEM CD) 120 MG 24 hr capsule Take 1 capsule (120 mg total) by mouth at bedtime. 30 capsule 6  . potassium chloride SA (K-DUR,KLOR-CON) 20 MEQ tablet Take 1 tablet (20 mEq total) by mouth daily. 90 tablet 3   No current facility-administered medications for this visit.     Allergies:   Tape    Social History:  The patient  reports that she quit smoking about 4 years ago. Her smoking use included Cigarettes. She has a 12.50 pack-year smoking history. She has never used smokeless tobacco. She reports that she  does not drink alcohol or use drugs.   Family History:  The patient's family history includes Diabetes in her mother; Heart failure in her father and maternal aunt.    ROS: All other systems are reviewed and negative. Unless otherwise mentioned in H&P    PHYSICAL EXAM: VS:  BP (!) 180/68   Pulse 99   Ht 5' 3"  (1.6 m)   Wt 140 lb (63.5 kg)   SpO2 97%   BMI 24.80 kg/m  , BMI Body mass index is 24.8 kg/m. GEN: Well nourished, well developed, in no acute distress  HEENT: normal  Neck: no JVD, carotid bruits, or masses Cardiac:RRR; 2\6 holosystolic murmur, , rubs, or gallops,no  edema  Respiratory:  clear to auscultation bilaterally, normal work of breathing GI: soft, nontender, nondistended, + BS MS: no deformity or atrophy  Skin: warm and dry, no rash Neuro:  Strength and sensation are intact Psych: euthymic mood, full affect   Recent Labs: 09/23/2016: TSH 0.503 11/08/2016: ALT 22 12/08/2016: BUN 9; Creatinine, Ser 0.79; Hemoglobin 11.4; Magnesium 2.0; Platelets 143; Potassium 4.4; Sodium 142    Lipid Panel    Component Value Date/Time   CHOL 206 (H) 12/08/2016 0554   CHOL 142 12/07/2013 1038   TRIG 136 12/08/2016 0554   TRIG 98 12/07/2013 1038   HDL 56 12/08/2016 0554   HDL 47 12/07/2013 1038   CHOLHDL 3.7 12/08/2016 0554   VLDL 27 12/08/2016 0554   LDLCALC 123 (H) 12/08/2016 0554   LDLCALC 75 12/07/2013 1038      Wt Readings from Last 3 Encounters:  12/09/16 140 lb (63.5 kg)  12/07/16 139 lb (63 kg)  11/11/16 142 lb (64.4 kg)      Other studies Reviewed: Echocardiogram 11/06/2016 Left ventricle: The cavity size was normal. Wall thickness was   increased in a pattern of mild LVH. Systolic function was   vigorous. The estimated ejection fraction was in the range of 65%   to 70%. Wall motion was normal; there were no regional wall   motion abnormalities. The study is not technically sufficient to   allow evaluation of LV diastolic function. - Aortic valve:  Poorly visualized. Mildly calcified leaflets. There   was mild to moderate regurgitation. Mean gradient (S): 15 mm Hg.   Valve area (VTI): 1.83 cm^2. - Mitral valve: Moderately to severely calcified annulus. There was   trivial regurgitation. - Left atrium: The atrium was mildly dilated. - Tricuspid valve: There was trivial regurgitation. - Pulmonary arteries: Systolic pressure could not be accurately   estimated. - Pericardium, extracardiac: A small pericardial effusion was   identified anterior to the heart.  ASSESSMENT AND PLAN:  1.  Chest Pain: Resolved during hospitalization with potassium replacement. She continues to have some diarrhea on diltiazem. Will give her potassium 20 mEq daily. Will order MRI as requested by Dr. Johnsie Cancel.   2. Atrial tachycardia: She is on coreg at maximum dose, and on diltiazem 30 mg BID. I will replace Diltiazem with long acting dose for ease of administration. She is hypertensive today, and therefore I will begin at lowest long acting dose of 120 mg. Will see her on close follow up for BP check.  3. Hypertension: BP is elevated today. Will increase diltiazem as discussed. She will come back next week with BP check to assess her response. Continue lisinopril.   4. Aortic and Mitral valve disease: No severe regurgitation is noted per echo. Will follow up annually unless she is symptomatic.   5. Insomnia; Has not seen PCP for Ambien refills. Gave her 10 tablets, but she must see PCP for any other refills. She verbalizes understanding.   Current medicines are reviewed at length with the patient today.    Labs/ tests ordered today include:  MRI cardiac  No orders of the defined types were placed in this encounter.    Disposition:   FU with  One week for BP check and one month clinic follow up.  Signed, Jory Sims, NP  12/09/2016 3:21 PM    [facility] [address], [place], [place] [address] Phone: [phone]; Fax:  [phone]

## 2016-12-10 ENCOUNTER — Telehealth: Payer: Self-pay | Admitting: Adult Health

## 2016-12-10 NOTE — Telephone Encounter (Signed)
Patient states to let [name] know she is not [name] take [name]. Please call patient with any questions. / tg

## 2016-12-10 NOTE — Telephone Encounter (Signed)
Spoke with pt. And she states that she will not be able to tolerate the diltiazem. She was wondering if there was another medication she could take that may not make her as sick to her stomach. Please advise.

## 2016-12-11 ENCOUNTER — Telehealth: Payer: Self-pay | Admitting: Cardiology

## 2016-12-11 ENCOUNTER — Telehealth: Payer: Self-pay | Admitting: Adult Health

## 2016-12-11 NOTE — Telephone Encounter (Signed)
FYI Patient states that she has started [place] w/ no side effects / tg

## 2016-12-11 NOTE — Telephone Encounter (Signed)
Will FYI Dr Harl Bowie

## 2016-12-11 NOTE — Telephone Encounter (Signed)
Please have her see Dr. Harl Bowie. She is on maximum dose of BB, cannot tolerate CCB with hx of tachycardia. Need further evaluation and recommendations for additional medical treatment.

## 2016-12-12 NOTE — Telephone Encounter (Signed)
Pt confirmed her apt w  Dr branch, nurse apt cx

## 2016-12-12 NOTE — Telephone Encounter (Signed)
Can she see me Thursday at 2pm in [place], a lot to review and go over regarding her medications and bp control   Barbara Abts MD

## 2016-12-16 ENCOUNTER — Ambulatory Visit (INDEPENDENT_AMBULATORY_CARE_PROVIDER_SITE_OTHER): Payer: Medicare Other | Admitting: Internal Medicine

## 2016-12-16 ENCOUNTER — Encounter (INDEPENDENT_AMBULATORY_CARE_PROVIDER_SITE_OTHER): Payer: Self-pay | Admitting: Internal Medicine

## 2016-12-16 VITALS — BP 156/60 | HR 60 | Temp 98.0°F | Ht 63.0 in | Wt 144.3 lb

## 2016-12-16 DIAGNOSIS — K760 Fatty (change of) liver, not elsewhere classified: Secondary | ICD-10-CM | POA: Diagnosis not present

## 2016-12-16 NOTE — Patient Instructions (Signed)
OV in 6 months.

## 2016-12-16 NOTE — Progress Notes (Signed)
Subjective:    Patient ID: [name], female    DOB: 01/24/1941, 76 y.o.   MRN: [id number]   06/2017 161. Today her weight was 144.3.   HPIHere today for f/u. She was last seen in October of 2017. Hx of NAFLD.  Her appetite is fair. She has a BM daily. No melena or BRRB. No abdominal pain.  Recently at [facility] for MI in March. Maintained on Plavix.  Her last EGD with banding was in 2015: Impression: Two columns of esophageal varices were banded. Portal gastropath Gastric antral vascular ectasia without stigmata of bleed.       09/25/2016 CT angio chest PE w or wo cm : chest pain: IMPRESSION: No evidence of pulmonary embolism.  Cirrhotic liver with upper abdominal varices.  Old granulomatous disease.  Stable nodule at RIGHT diaphragm 8 mm diameter.  Aortic atherosclerosis and coronary arterial calcification.  Chronic wall thickening of the distal esophagus with adjacent prominent nonenhanced blood vessels unable to exclude esophageal varices.    06/30/2016 US abdomen RUQ: cirrhosis: IMPRESSION: 1. Very questionable small gallstones and/or sludge noted. Tiny gallstones noted on prior CT of 07/09/2015. No biliary distention.  2. Liver again has a heterogeneous nodular parenchymal pattern consistent with cirrhosis. No focal hepatic abnormality identified.  CBC    Component Value Date/Time   WBC 3.8 (L) 12/08/2016 0554   RBC 4.11 12/08/2016 0554   HGB 11.4 (L) 12/08/2016 0554   HCT 35.9 (L) 12/08/2016 0554   PLT 143 (L) 12/08/2016 0554   MCV 87.3 12/08/2016 0554   MCH 27.7 12/08/2016 0554   MCHC 31.8 12/08/2016 0554   RDW 17.0 (H) 12/08/2016 0554   LYMPHSABS 2.2 11/08/2016 0424   MONOABS 0.4 11/08/2016 0424   EOSABS 0.2 11/08/2016 0424   BASOSABS 0.0 11/08/2016 0424   Hepatic Function Panel     Component Value Date/Time   PROT 7.2 11/08/2016 0424   ALBUMIN 3.9 11/08/2016 0424   AST 32 11/08/2016 0424   ALT 22 11/08/2016 0424   ALKPHOS 69  11/08/2016 0424   BILITOT 0.5 11/08/2016 0424   BILIDIR 0.1 12/17/2015 1439   IBILI 0.5 12/17/2015 1439       06/21/2014: Procedure: EGD with esophageal variceal banding.  Indications: Patient is 76 year old Caucasian female with cirrhosis secondary to half old complicated by esophageal variceal bleed last year. She is now returning for EGD and banding for recurrent varices. She was hospitalized about 3 months ago in order to have heme positive stool and low hemoglobin but there is no history of melena or rectal bleeding. Hemoglobin 7 weeks ago was up to 11.8 g. Patient is on low-dose aspirin because of history of CVA(4 months ago) Impression: Two columns of esophageal varices were banded. Portal gastropathy Gastric antral vascular ectasia without stigmata of bleed.  09/26/2016 Cardiac cath and coronary angiography:    The left ventricular systolic function is normal.  LV end diastolic pressure is normal.  The left ventricular ejection fraction is 55-65% by visual estimate.  There is mild aortic valve stenosis.  Mid RCA lesion, 25 %stenosed.  Ost Ramus lesion, 30 %stenosed.         Review of Systems Past Medical History:  Diagnosis Date  . Anemia   . Aortic insufficiency    Moderate  . Back pain   . Carotid stenosis, bilateral   . Cirrhosis ([place])   . Coronary artery disease    Stent x 2 RCA 1995, cardiac catheterization 09/2016 showing only mild atherosclerosis  .  DDD (degenerative disc disease), lumbar   . Essential hypertension   . GERD (gastroesophageal reflux disease)   . Hiatal hernia   . History of stroke   . Hypercholesteremia   . IBS (irritable bowel syndrome)   . Myocardial infarction 1995  . Non-alcoholic fatty liver disease   . OSA (obstructive sleep apnea)    Sleep study 2009  AHI 9.91/hr and during REM sleep 35.58/hr  . Pericardial effusion    a. small by echo 2018.  Marland Kitchen PSVT (paroxysmal supraventricular tachycardia) ([place])    . Recurrent UTI   . Type 2 diabetes mellitus ([place])   . Varices, esophageal ([place])     Past Surgical History:  Procedure Laterality Date  . [place]  . APPENDECTOMY    . BACK SURGERY  1985  . CARDIAC CATHETERIZATION  [phone]   Stent to the proximal RCA after MI   . CARDIAC CATHETERIZATION N/A 09/26/2016   Procedure: Left Heart Cath and Coronary Angiography;  Surgeon: Leonie Man, MD;  Location: [facility];  Service: Cardiovascular;  Laterality: N/A;  . CATARACT EXTRACTION W/PHACO Right 07/04/2014   Procedure: CATARACT EXTRACTION PHACO AND INTRAOCULAR LENS PLACEMENT (IOC);  Surgeon: Elta Guadeloupe T. Gershon Crane, MD;  Location: [facility];  Service: Ophthalmology;  Laterality: Right;  CDE:13.13  . COLONOSCOPY    . DILATION AND CURETTAGE OF UTERUS     x2  . Epi Retinal Membrane Peel Left   . ERCP    . ESOPHAGEAL BANDING N/A 04/01/2013   Procedure: ESOPHAGEAL BANDING;  Surgeon: Rogene Houston, MD;  [facility];  Service: Endoscopy;  Laterality: N/A;  . ESOPHAGEAL BANDING N/A 05/24/2013   Procedure: ESOPHAGEAL BANDING;  Surgeon: Rogene Houston, MD;  [facility];  Service: Endoscopy;  Laterality: N/A;  . ESOPHAGEAL BANDING N/A 06/21/2014   Procedure: ESOPHAGEAL BANDING;  Surgeon: Rogene Houston, MD;  [facility];  Service: Endoscopy;  Laterality: N/A;  . ESOPHAGOGASTRODUODENOSCOPY N/A 04/01/2013   Procedure: ESOPHAGOGASTRODUODENOSCOPY (EGD);  Surgeon: Rogene Houston, MD;  [facility];  Service: Endoscopy;  Laterality: N/A;  230-rescheduled to 8:30am Ann notified pt  . ESOPHAGOGASTRODUODENOSCOPY N/A 05/24/2013   Procedure: ESOPHAGOGASTRODUODENOSCOPY (EGD);  Surgeon: Rogene Houston, MD;  [facility];  Service: Endoscopy;  Laterality: N/A;  730  . ESOPHAGOGASTRODUODENOSCOPY N/A 06/21/2014   Procedure: ESOPHAGOGASTRODUODENOSCOPY (EGD);  Surgeon: Rogene Houston, MD;  [facility];  Service: Endoscopy;   Laterality: N/A;  930-rescheduled 10/14 @ 1200 Ann to notify pt  . EYE SURGERY  08   cataract surgery of the left eye  . HARDWARE REMOVAL Right 01/17/2013   Procedure: REMOVAL OF HARDWARE AND EXCISION ULNAR STYLOID RIGHT WRIST;  Surgeon: Tennis Must, MD;  Location: [facility];  Service: Orthopedics;  Laterality: Right;  . MYRINGOTOMY  2012   both ears  . TONSILLECTOMY    . VAGINAL HYSTERECTOMY  1972  . WRIST SURGERY     rt wrist hardwear removal    Allergies  Allergen Reactions  . Tape Rash    Current Outpatient Prescriptions on File Prior to Visit  Medication Sig Dispense Refill  . acetaminophen (TYLENOL) 500 MG tablet Take 1,000 mg by mouth 2 (two) times daily.    Marland Kitchen aspirin 81 MG chewable tablet Chew 243 mg by mouth at bedtime.    Marland Kitchen atorvastatin (LIPITOR) 40 MG tablet Take 40 mg by mouth at bedtime.     Marland Kitchen  carvedilol (COREG) 25 MG tablet Take 1 tablet (25 mg total) by mouth 2 (two) times daily. 60 tablet 1  . clopidogrel (PLAVIX) 75 MG tablet Take 1 tablet (75 mg total) by mouth daily. (Patient taking differently: Take 75 mg by mouth at bedtime. ) 30 tablet 2  . ibuprofen (ADVIL,MOTRIN) 200 MG tablet Take 400 mg by mouth 2 (two) times daily.     . isosorbide mononitrate (IMDUR) 30 MG 24 hr tablet Take 30 mg by mouth at bedtime.     Marland Kitchen lisinopril (PRINIVIL,ZESTRIL) 20 MG tablet Take 0.5 tablets (10 mg total) by mouth daily. (Patient taking differently: Take 10 mg by mouth at bedtime. ) 30 tablet 6  . ondansetron (ZOFRAN ODT) 8 MG disintegrating tablet Take 1 tablet (8 mg total) by mouth every 8 (eight) hours as needed for nausea or vomiting. 20 tablet 0  . pantoprazole (PROTONIX) 40 MG tablet Take 40 mg by mouth at bedtime.    . potassium chloride SA (K-DUR,KLOR-CON) 20 MEQ tablet Take 1 tablet (20 mEq total) by mouth daily. 90 tablet 3  . zolpidem (AMBIEN) 10 MG tablet Take 1 tablet (10 mg total) by mouth at bedtime. Refills to be sent to PCP 10 tablet 0  .  [DISCONTINUED] promethazine (PHENERGAN) 12.5 MG tablet Take 1 tablet (12.5 mg total) by mouth every 6 (six) hours as needed for nausea or vomiting. (Patient not taking: Reported on 12/25/2015) 15 tablet 0   No current facility-administered medications on file prior to visit.            Objective:   Physical Exam Blood pressure (!) 156/60, pulse 60, temperature 98 F (36.7 C), height 5' 3"  (1.6 m), weight 144 lb 4.8 oz (65.5 kg). Alert and oriented. Skin warm and dry. Oral mucosa is moist.   . Sclera anicteric, conjunctivae is pink. Thyroid not enlarged. No cervical lymphadenopathy. Lungs clear. Heart regular rate and rhythm.  Abdomen is soft. Bowel sounds are positive. No hepatomegaly. No abdominal masses felt. No tenderness.  No edema to lower extremities.  .     Assessment & Plan:  NAFLD. OV in 6 months. Will repeat US at that visit.  I declined to write an Rx for Ambien.

## 2016-12-18 ENCOUNTER — Encounter: Payer: Self-pay | Admitting: Cardiology

## 2016-12-18 ENCOUNTER — Ambulatory Visit (INDEPENDENT_AMBULATORY_CARE_PROVIDER_SITE_OTHER): Payer: Medicare Other | Admitting: Cardiology

## 2016-12-18 VITALS — BP 148/64 | HR 85 | Ht 63.0 in | Wt 144.0 lb

## 2016-12-18 DIAGNOSIS — I6523 Occlusion and stenosis of bilateral carotid arteries: Secondary | ICD-10-CM

## 2016-12-18 DIAGNOSIS — R002 Palpitations: Secondary | ICD-10-CM

## 2016-12-18 DIAGNOSIS — Z79899 Other long term (current) drug therapy: Secondary | ICD-10-CM

## 2016-12-18 DIAGNOSIS — I471 Supraventricular tachycardia: Secondary | ICD-10-CM | POA: Diagnosis not present

## 2016-12-18 NOTE — Patient Instructions (Signed)
Medication Instructions:  Your physician recommends that you continue on your current medications as directed. Please refer to the Current Medication list given to you today.   Labwork: Your physician recommends that you return for lab work in: 2 WEEKS    Testing/Procedures: NONE  Follow-Up: Your physician recommends that you schedule a follow-up appointment in: AUGUST 2018    Any Other Special Instructions Will Be Listed Below (If Applicable).     If you need a refill on your cardiac medications before your next appointment, please call your pharmacy.

## 2016-12-18 NOTE — Progress Notes (Signed)
Clinical Summary [name] is a 76 y.o.female seen today for follow up of the following medical problems.   1. Palpitaions/PSVT - previous monitor showed infrequent PACs. Clinically from notes suspected PSVT but does not appear ever captured by monitoring.  - occasional palpitations, 1-2 episdoes per month. Lasts just a few seconds.    - no recent palpitations.Taking cartia and tolerating well. The  Still on coreg 44m bid.    2. CAD - history of MI in 1995, received 2 stents to RCA -  cath 2007 with patent coronaries.  - cath Jan 2018 nonobstructive disease - echo 10/2016 LVEF 65-70%, no WMAs  - admit 10/2016 with elevated troponin to 1.33, Thought to be demand ischemia related to her SVT at the time - admit 12/2016 with chest pain. Mild troponin 0.32 at that time. Managed medically due to recent normal cath and stable echo - from notes there were plans for cardiac MRI however patient has not scheduled.  - no recurrent chest pain since that time.    3. Carotid stenosis - 04/2016 moderate bilateral disease - no recent symptoms.   4. 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 Anemia   . Aortic insufficiency    Moderate  . Back pain   . Carotid stenosis, bilateral   . Cirrhosis ([facility]   . Coronary artery disease    Stent x 2 RCA 1995, cardiac catheterization 09/2016 showing only mild atherosclerosis  . DDD (degenerative disc disease), lumbar   . Essential hypertension   . GERD (gastroesophageal reflux disease)   . Hiatal hernia   . History of stroke   . Hypercholesteremia   . IBS (irritable bowel syndrome)   . Myocardial infarction 1995  . Non-alcoholic fatty liver disease   . OSA (obstructive sleep apnea)    Sleep study 2009  AHI 9.91/hr and during REM sleep 35.58/hr  . Pericardial effusion    a. small by echo 2018.  .Marland KitchenPSVT (paroxysmal supraventricular tachycardia) ([facility]   . Recurrent UTI   . Type 2 diabetes mellitus  ([facility]   . Varices, esophageal (H[facility])      Allergies  Allergen Reactions  . Tape Rash     Current Outpatient Prescriptions  Medication Sig Dispense Refill  . acetaminophen (TYLENOL) 500 MG tablet Take 1,000 mg by mouth 2 (two) times daily.    .Marland Kitchenaspirin 81 MG chewable tablet Chew 243 mg by mouth at bedtime.    .Marland Kitchenatorvastatin (LIPITOR) 40 MG tablet Take 40 mg by mouth at bedtime.     . carvedilol (COREG) 25 MG tablet Take 1 tablet (25 mg total) by mouth 2 (two) times daily. 60 tablet 1  . clopidogrel (PLAVIX) 75 MG tablet Take 1 tablet (75 mg total) by mouth daily. (Patient taking differently: Take 75 mg by mouth at bedtime. ) 30 tablet 2  . diltiazem (CARDIZEM CD) 120 MG 24 hr capsule Take 120 mg by mouth daily.    .Marland Kitchenibuprofen (ADVIL,MOTRIN) 200 MG tablet Take 400 mg by mouth 2 (two) times daily.     . isosorbide mononitrate (IMDUR) 30 MG 24 hr tablet Take 30 mg by mouth at bedtime.     .Marland Kitchenlisinopril (PRINIVIL,ZESTRIL) 20 MG tablet Take 0.5 tablets (10 mg total) by mouth daily. (Patient taking differently: Take 10 mg by mouth at bedtime. ) 30 tablet 6  . ondansetron (ZOFRAN ODT) 8 MG disintegrating tablet Take 1 tablet (8  mg total) by mouth every 8 (eight) hours as needed for nausea or vomiting. 20 tablet 0  . pantoprazole (PROTONIX) 40 MG tablet Take 40 mg by mouth at bedtime.    . potassium chloride SA (K-DUR,KLOR-CON) 20 MEQ tablet Take 1 tablet (20 mEq total) by mouth daily. 90 tablet 3  . zolpidem (AMBIEN) 10 MG tablet Take 1 tablet (10 mg total) by mouth at bedtime. Refills to be sent to PCP 10 tablet 0   No current facility-administered medications for this visit.      Past Surgical History:  Procedure Laterality Date  . [place]  . APPENDECTOMY    . BACK SURGERY  1985  . CARDIAC CATHETERIZATION  [phone]   Stent to the proximal RCA after MI   . CARDIAC CATHETERIZATION N/A 09/26/2016   Procedure: Left Heart Cath and Coronary Angiography;  Surgeon:  Leonie Man, MD;  Location: [facility];  Service: Cardiovascular;  Laterality: N/A;  . CATARACT EXTRACTION W/PHACO Right 07/04/2014   Procedure: CATARACT EXTRACTION PHACO AND INTRAOCULAR LENS PLACEMENT (IOC);  Surgeon: Elta Guadeloupe T. Gershon Crane, MD;  Location: [facility];  Service: Ophthalmology;  Laterality: Right;  CDE:13.13  . COLONOSCOPY    . DILATION AND CURETTAGE OF UTERUS     x2  . Epi Retinal Membrane Peel Left   . ERCP    . ESOPHAGEAL BANDING N/A 04/01/2013   Procedure: ESOPHAGEAL BANDING;  Surgeon: Rogene Houston, MD;  Location: [facility];  Service: Endoscopy;  Laterality: N/A;  . ESOPHAGEAL BANDING N/A 05/24/2013   Procedure: ESOPHAGEAL BANDING;  Surgeon: Rogene Houston, MD;  [facility];  Service: Endoscopy;  Laterality: N/A;  . ESOPHAGEAL BANDING N/A 06/21/2014   Procedure: ESOPHAGEAL BANDING;  Surgeon: Rogene Houston, MD;  [facility];  Service: Endoscopy;  Laterality: N/A;  . ESOPHAGOGASTRODUODENOSCOPY N/A 04/01/2013   Procedure: ESOPHAGOGASTRODUODENOSCOPY (EGD);  Surgeon: Rogene Houston, MD;  [facility];  Service: Endoscopy;  Laterality: N/A;  230-rescheduled to 8:30am Ann notified pt  . ESOPHAGOGASTRODUODENOSCOPY N/A 05/24/2013   Procedure: ESOPHAGOGASTRODUODENOSCOPY (EGD);  Surgeon: Rogene Houston, MD;  [facility];  Service: Endoscopy;  Laterality: N/A;  730  . ESOPHAGOGASTRODUODENOSCOPY N/A 06/21/2014   Procedure: ESOPHAGOGASTRODUODENOSCOPY (EGD);  Surgeon: Rogene Houston, MD;  [facility];  Service: Endoscopy;  Laterality: N/A;  930-rescheduled 10/14 @ 1200 Ann to notify pt  . EYE SURGERY  08   cataract surgery of the left eye  . HARDWARE REMOVAL Right 01/17/2013   Procedure: REMOVAL OF HARDWARE AND EXCISION ULNAR STYLOID RIGHT WRIST;  Surgeon: Tennis Must, MD;  Location: [facility];  Service: Orthopedics;  Laterality: Right;  . MYRINGOTOMY  2012   both ears  . TONSILLECTOMY    . VAGINAL  HYSTERECTOMY  1972  . WRIST SURGERY     rt wrist hardwear removal     Allergies  Allergen Reactions  . Tape Rash      Family History  Problem Relation Age of Onset  . Diabetes Mother   . Heart failure Father   . Heart failure Maternal Aunt      Social History Ms. Beamon reports that she quit smoking about 4 years ago. Her smoking use included Cigarettes. She has a 12.50 pack-year smoking history. She has never used smokeless tobacco. Ms. Heron reports that she does not drink alcohol.   Review of Systems CONSTITUTIONAL: No weight loss, fever, chills, weakness  or fatigue.  HEENT: Eyes: No visual loss, blurred vision, double vision or yellow sclerae.No hearing loss, sneezing, congestion, runny nose or sore throat.  SKIN: No rash or itching.  CARDIOVASCULAR: per hpi RESPIRATORY: No shortness of breath, cough or sputum.  GASTROINTESTINAL: No anorexia, nausea, vomiting or diarrhea. No abdominal pain or blood.  GENITOURINARY: No burning on urination, no polyuria NEUROLOGICAL: No headache, dizziness, syncope, paralysis, ataxia, numbness or tingling in the extremities. No change in bowel or bladder control.  MUSCULOSKELETAL: No muscle, back pain, joint pain or stiffness.  LYMPHATICS: No enlarged nodes. No history of splenectomy.  PSYCHIATRIC: No history of depression or anxiety.  ENDOCRINOLOGIC: No reports of sweating, cold or heat intolerance. No polyuria or polydipsia.  Marland Kitchen   Physical Examination Vitals:   12/18/16 1358  BP: (!) 148/64  Pulse: 85   Vitals:   12/18/16 1358  Weight: 144 lb (65.3 kg)  Height: 5' 3"  (1.6 m)    Gen: resting comfortably, no acute distress HEENT: no scleral icterus, pupils equal round and reactive, no palptable cervical adenopathy,  CV: RRR, 2/6 systolic murmur RUSB, bilateral carotid bruits Resp: Clear to auscultation bilaterally GI: abdomen is soft, non-tender, non-distended, normal bowel sounds, no hepatosplenomegaly MSK: extremities are  warm, no edema.  Skin: warm, no rash Neuro:  no focal deficits Psych: appropriate affect   Diagnostic Studies 02/2014 echo Study Conclusions  - Left ventricle: The cavity size was normal. Wall thickness was increased in a pattern of mild LVH. Systolic function was vigorous. The estimated ejection fraction was in the range of 70% to 75%. Wall motion was normal; there were no regional wall motion abnormalities. Doppler parameters are consistent with abnormal left ventricular relaxation (grade 1 diastolic dysfunction). - Aortic valve: Mildly to moderately calcified annulus. Trileaflet; moderately calcified leaflets. There was mild regurgitation. Mean gradient (S): 9 mm Hg. - Mitral valve: Severely calcified posterior annulus. There was trivial regurgitation. Mean gradient (D): 4 mm Hg. Valve area by pressure half-time: 2.04 cm^2. - Left atrium: The atrium was mildly to moderately dilated. - Right atrium: Central venous pressure (est): 3 mm Hg. - Atrial septum: A patent foramen ovale cannot be excluded. - Tricuspid valve: There was trivial regurgitation. - Pulmonary arteries: Systolic pressure could not be accurately estimated. - Pericardium, extracardiac: There was no pericardial effusion.  Impressions:  - Mild LVH with LVEF 93-90%, grade 1 diastolic dysfunction. Severe posterior MAC with trivial mitral regurgitation and borderline mild mitral stenosis. Mild to moderate left atrial enlargement. Moderately sclerotic aortic valve with mild aortic regurgitation, no aortic stenosis. Unable to assess PASP. Cannot exclude PFO.  02/2014 Carotid US RIGHT CAROTID ARTERY: A mild amount of predominately calcified plaque is present at the level of the distal bulb and proximal ICA. Proximal velocities and waveforms are unremarkable and estimated ICA stenosis is less than 50%. Mildly elevated velocities in the higher internal carotid segment within the upper  neck noted at a level of mild tortuosity without focal plaque.  RIGHT VERTEBRAL ARTERY: Antegrade flow with normal waveform and velocity.  LEFT CAROTID ARTERY: Stable moderate plaque burden at the level of the distal bulb and extending up to the ICA origin. This causes luminal stenosis with turbulent flow. Estimated proximal left ICA stenosis is 50- 69%.  LEFT VERTEBRAL ARTERY: Antegrade flow with normal waveform and velocity.  IMPRESSION: Bilateral atherosclerotic plaque at the level of the distal carotid bulbs and proximal internal carotid arteries. Estimated right ICA stenosis is less than 50%. Estimated left ICA stenosis is  50- 69%.  07/2006 cath HEMODYNAMIC DATA:The left ventricle had a pressure of 142/2 with an end diastolic pressure of 9 mmHg.The aortic pressure was 141/60 with a mean of 93 mmHg.There was no pressure gradient across the aortic valve.  ANGIOGRAPHIC DATA:Left ventricle:The left ventricular systolic function was normal with an ejection fraction of 60-65%.  Right coronary artery:The right coronary artery is a large caliber vessel and dominant.There is mild 10% luminal irregularity in the mid- segment of the right coronary artery.Otherwise the RCA is large and smooth.  Left main:The left main is a large caliber vessel.Smooth and normal.  LAD:The LAD shows mild calcification of the proximal segment. Otherwise there is no significant luminal obstruction.It gives origin to a very small diagonal one and a moderate sized diagonal two.  The ramus intermedius:The ramus intermedius is a moderate caliber vessel.The ostium shows about 10% mild luminal irregularity. This is significantly improved compared to cardiac catheterization done a year ago where it was at least 70%, if not more, stenosed.  Circumflex:The circumflex is a large caliber vessel.It is smooth and has very minimal luminal  irregularity.  ASCENDING AORTAGRAM:The ascending aortogram revealed the presence of three aortic valve cusps.There is no aortic ascending aneurysm, aortic regurgitation or aortic dissection.  ABDOMINAL AORTOGRAM:Abdominal aortogram revealed the presence of two renal arteries, one on either side.They are widely patent.The left renal artery showed a mild calcification with around 10% luminal stenosis but no pressure gradient across the renal artery.  IMPRESSION:No significant coronary artery disease by cardiac catheterization.Actually the ramus intermedius, which had at least a 70% stenosis a year ago now appears to have less than 10-20% stenosis at most and there is significant regression of atherosclerotic plaque with aggressive medical therapy.Overall I suspect that the chest pain is secondary to non cardiac causes, probably suspect GERD, given the fact that she has recurrent chest discomfort in lying down position.She also has body habitus with obesity and GERD should be a primary consideration.She will be discharged home today with PPI; however, if the chest pain persists, evaluation for non cardiac cause of chest pain is indicated.  A total of 120 mL of contrast was utilized for diagnostic angiography.  TECHNIQUES OF THE PROCEDURE:Under the usual sterile precautions, using a 6-French right femoral venous access and a 6-French multipurpose B2 catheter was advanced to the ascending aorta, a 0.025 JR.The catheter was gently advanced to the left ventricle.The left ventricular pressure monitored.Contrast was injected in the left ventricle performed in both the LAO and the RAO projection.The catheter was flushed and gently pulled back.The pressure gradient across the aortic valve was monitored.Then the right coronary artery was selectively engaged and angiography was performed.Then the left coronary artery was  selectively engaged and angiography was performed.Then the catheter was pulled into the root of the aorta and ascending aortogram was performed in the LAO projection.Then the catheter was pulled back into the abdominal aorta and abdominal aortogram was performed.Right femoral angiography was performed through the arterial access sheath and the access was closed with [employer] with excellent hemostasis.The patient tolerated the procedure.No immediate complications were noted.  Jan 2018 cath  The left ventricular systolic function is normal.  LV end diastolic pressure is normal.  The left ventricular ejection fraction is 55-65% by visual estimate.  There is mild aortic valve stenosis.  Mid RCA lesion, 25 %stenosed.  Ost Ramus lesion, 30 %stenosed.    Essentially minimal coronary artery disease withlesions. There is calcification of the mitral valve annulus and mild callus complication the proximal  LAD. There does appear to be a stent in the RCA that is essentially patent.  Nothing to explain the patient's positive troponin.  Plan: Return to nursing unit for ongoing care and TR band removal Further care per primary team   10/2016 echo Study Conclusions  - Left ventricle: The cavity size was normal. Wall thickness was   increased in a pattern of mild LVH. Systolic function was   vigorous. The estimated ejection fraction was in the range of 65%   to 70%. Wall motion was normal; there were no regional wall   motion abnormalities. The study is not technically sufficient to   allow evaluation of LV diastolic function. - Aortic valve: Poorly visualized. Mildly calcified leaflets. There   was mild to moderate regurgitation. Mean gradient (S): 15 mm Hg.   Valve area (VTI): 1.83 cm^2. - Mitral valve: Moderately to severely calcified annulus. There was   trivial regurgitation. - Left atrium: The atrium was mildly dilated. - Tricuspid valve: There was trivial  regurgitation. - Pulmonary arteries: Systolic pressure could not be accurately   estimated. - Pericardium, extracardiac: A small pericardial effusion was   identified anterior to the heart.  Impressions:  - Mild LVH with LVEF 65-70%. Indeterminate diastolic function. Mild   left atrial enlargement. Moderate to severe mitral annular   calcification with trivial mitral regurgitation. Aortic valve   poorly visualized and appears sclerotic with associated mild to   moderate aortic regurgitation. Small anterior pericardial   effusion noted.  11/2016 Event monitor - no arrhythmias   Assessment and Plan   1. Palpitations - overall doing well. Has had some side effects to dilt in the past but currently tolerating, we will continue   2.  CAD -recent cath with nonobstructive disease. A cardiac MRI had been preivously ordered, she asks to cancel since she is no longer having symptoms. We will d/c  3. Carotid stenosis - continue to monitor    Repeat BMET/Mg.     Arnoldo Lenis, M.D.,

## 2016-12-23 DIAGNOSIS — E1140 Type 2 diabetes mellitus with diabetic neuropathy, unspecified: Secondary | ICD-10-CM | POA: Diagnosis not present

## 2016-12-23 DIAGNOSIS — R195 Other fecal abnormalities: Secondary | ICD-10-CM | POA: Diagnosis not present

## 2016-12-24 DIAGNOSIS — R195 Other fecal abnormalities: Secondary | ICD-10-CM | POA: Diagnosis not present

## 2017-01-08 ENCOUNTER — Ambulatory Visit: Payer: Self-pay | Admitting: Adult Health

## 2017-01-10 ENCOUNTER — Encounter: Payer: Self-pay | Admitting: Adult Health

## 2017-01-12 ENCOUNTER — Telehealth: Payer: Self-pay

## 2017-01-12 MED ORDER — DILTIAZEM HCL ER COATED BEADS 120 MG PO CP24: 120.0000 mg | ORAL_CAPSULE | Freq: Every day | ORAL | 3 refills | Status: DC

## 2017-01-12 NOTE — Telephone Encounter (Signed)
Refilled cartia 120 mg daily

## 2017-01-13 ENCOUNTER — Encounter: Payer: Self-pay | Admitting: Adult Health

## 2017-01-13 ENCOUNTER — Ambulatory Visit (INDEPENDENT_AMBULATORY_CARE_PROVIDER_SITE_OTHER): Payer: Medicare Other | Admitting: Adult Health

## 2017-01-13 VITALS — BP 160/74 | HR 81 | Ht 63.0 in | Wt 137.0 lb

## 2017-01-13 DIAGNOSIS — I10 Essential (primary) hypertension: Secondary | ICD-10-CM | POA: Diagnosis not present

## 2017-01-13 DIAGNOSIS — I471 Supraventricular tachycardia: Secondary | ICD-10-CM | POA: Diagnosis not present

## 2017-01-13 NOTE — Patient Instructions (Signed)
Your physician recommends that you schedule a follow-up appointment in: 3 Months.   Your physician recommends that you continue on your current medications as directed. Please refer to the Current Medication list given to you today.  Your physician has requested that you regularly monitor and record your blood pressure readings at home. Please use the same machine at the same time of day to check your readings and record them to bring to your follow-up visit.  Call office if systolic BP is above 170  If you need a refill on your cardiac medications before your next appointment, please call your pharmacy.  Thank you for choosing [facility]!

## 2017-01-13 NOTE — Progress Notes (Signed)
Cardiology Office Note   Date:  01/13/2017   ID:  [name], DOB 23-Apr-1941, MRN [id number]  PCP:  Redmond School, MD  Cardiologist: Cloria Spring, NP   No chief complaint on file.     History of Present Illness: [name] is a 76 y.o. female who presents for ongoing assessment and management of PSVT, coronary artery disease with history of MI in 1995, stents to the right coronary artery 2, most recent catheterization in January 2018 revealing nonobstructive disease, most recent echo in April 2018 revealed an EF of 65-70% with no wall motion abnormalities. Other history includes carotid stenosis and cryptogenic cirrhosis.  The patient was last seen in the office on 12/18/2016, patient is clinically stable. Planned MRI (cardiac) was canceled in the setting of an nonobstructive disease per cath. A repeat BMET and magnesium was ordered.  Labs: 12/08/2016 Sodium 142, potassium 4.4, chloride 107, CO2 28, glucose 105, BUN 9, creatinine 0.79. Magnesium 2.0.  She is here today without any cardiac complaints. Blood pressure is more elevated than on last office visit. She is also lost 7 pounds since last office visit. She denies chest pain, nausea vomiting dizziness or dyspnea. Energy level is unchanged. She is medically compliant.  Past Medical History:  Diagnosis Date  . Anemia   . Aortic insufficiency    Moderate  . Back pain   . Carotid stenosis, bilateral   . Cirrhosis ([place])   . Coronary artery disease    Stent x 2 RCA 1995, cardiac catheterization 09/2016 showing only mild atherosclerosis  . DDD (degenerative disc disease), lumbar   . Essential hypertension   . GERD (gastroesophageal reflux disease)   . Hiatal hernia   . History of stroke   . Hypercholesteremia   . IBS (irritable bowel syndrome)   . Myocardial infarction ([place]) 1995  . Non-alcoholic fatty liver disease   . OSA (obstructive sleep apnea)    Sleep study 2009  AHI 9.91/hr and during REM sleep 35.58/hr   . Pericardial effusion    a. small by echo 2018.  Marland Kitchen PSVT (paroxysmal supraventricular tachycardia) ([place])   . Recurrent UTI   . Type 2 diabetes mellitus ([place])   . Varices, esophageal ([place])     Past Surgical History:  Procedure Laterality Date  . [place]  . APPENDECTOMY    . BACK SURGERY  1985  . CARDIAC CATHETERIZATION  [phone]   Stent to the proximal RCA after MI   . CARDIAC CATHETERIZATION N/A 09/26/2016   Procedure: Left Heart Cath and Coronary Angiography;  Surgeon: Leonie Man, MD;  Location: [facility];  Service: Cardiovascular;  Laterality: N/A;  . CATARACT EXTRACTION W/PHACO Right 07/04/2014   Procedure: CATARACT EXTRACTION PHACO AND INTRAOCULAR LENS PLACEMENT (IOC);  Surgeon: Elta Guadeloupe T. Gershon Crane, MD;  Location: [facility];  Service: Ophthalmology;  Laterality: Right;  CDE:13.13  . COLONOSCOPY    . DILATION AND CURETTAGE OF UTERUS     x2  . Epi Retinal Membrane Peel Left   . ERCP    . ESOPHAGEAL BANDING N/A 04/01/2013   Procedure: ESOPHAGEAL BANDING;  Surgeon: Rogene Houston, MD;  [facility];  Service: Endoscopy;  Laterality: N/A;  . ESOPHAGEAL BANDING N/A 05/24/2013   Procedure: ESOPHAGEAL BANDING;  Surgeon: Rogene Houston, MD;  [facility];  Service: Endoscopy;  Laterality: N/A;  . ESOPHAGEAL BANDING N/A 06/21/2014   Procedure: ESOPHAGEAL BANDING;  Surgeon: Rogene Houston, MD;  [facility];  Service: Endoscopy;  Laterality: N/A;  . ESOPHAGOGASTRODUODENOSCOPY N/A 04/01/2013   Procedure: ESOPHAGOGASTRODUODENOSCOPY (EGD);  Surgeon: Rogene Houston, MD;  [facility];  Service: Endoscopy;  Laterality: N/A;  230-rescheduled to 8:30am Ann notified pt  . ESOPHAGOGASTRODUODENOSCOPY N/A 05/24/2013   Procedure: ESOPHAGOGASTRODUODENOSCOPY (EGD);  Surgeon: Rogene Houston, MD;  [facility];  Service: Endoscopy;  Laterality: N/A;  730  . ESOPHAGOGASTRODUODENOSCOPY N/A 06/21/2014   Procedure:  ESOPHAGOGASTRODUODENOSCOPY (EGD);  Surgeon: Rogene Houston, MD;  [facility];  Service: Endoscopy;  Laterality: N/A;  930-rescheduled 10/14 @ 1200 Ann to notify pt  . EYE SURGERY  08   cataract surgery of the left eye  . HARDWARE REMOVAL Right 01/17/2013   Procedure: REMOVAL OF HARDWARE AND EXCISION ULNAR STYLOID RIGHT WRIST;  Surgeon: Tennis Must, MD;  Location: [facility];  Service: Orthopedics;  Laterality: Right;  . MYRINGOTOMY  2012   both ears  . TONSILLECTOMY    . VAGINAL HYSTERECTOMY  1972  . WRIST SURGERY     rt wrist hardwear removal     Current Outpatient Prescriptions  Medication Sig Dispense Refill  . acetaminophen (TYLENOL) 500 MG tablet Take 1,000 mg by mouth 2 (two) times daily.    Marland Kitchen aspirin 81 MG chewable tablet Chew 243 mg by mouth at bedtime.    Marland Kitchen atorvastatin (LIPITOR) 40 MG tablet Take 40 mg by mouth at bedtime.     . carvedilol (COREG) 25 MG tablet Take 1 tablet (25 mg total) by mouth 2 (two) times daily. 60 tablet 1  . clopidogrel (PLAVIX) 75 MG tablet Take 1 tablet (75 mg total) by mouth daily. (Patient taking differently: Take 75 mg by mouth at bedtime. ) 30 tablet 2  . diltiazem (CARDIZEM CD) 120 MG 24 hr capsule Take 1 capsule (120 mg total) by mouth daily. 90 capsule 3  . ibuprofen (ADVIL,MOTRIN) 200 MG tablet Take 400 mg by mouth 2 (two) times daily.     . isosorbide mononitrate (IMDUR) 30 MG 24 hr tablet Take 30 mg by mouth at bedtime.     Marland Kitchen lisinopril (PRINIVIL,ZESTRIL) 20 MG tablet Take 20 mg by mouth daily.    . ondansetron (ZOFRAN ODT) 8 MG disintegrating tablet Take 1 tablet (8 mg total) by mouth every 8 (eight) hours as needed for nausea or vomiting. 20 tablet 0  . pantoprazole (PROTONIX) 40 MG tablet Take 40 mg by mouth at bedtime.    . potassium chloride SA (K-DUR,KLOR-CON) 20 MEQ tablet Take 1 tablet (20 mEq total) by mouth daily. 90 tablet 3  . zolpidem (AMBIEN) 10 MG tablet Take 1 tablet (10 mg total) by mouth at  bedtime. Refills to be sent to PCP 10 tablet 0   No current facility-administered medications for this visit.     Allergies:   Tape    Social History:  The patient  reports that she quit smoking about 5 years ago. Her smoking use included Cigarettes. She has a 12.50 pack-year smoking history. She has never used smokeless tobacco. She reports that she does not drink alcohol or use drugs.   Family History:  The patient's family history includes Diabetes in her mother; Heart failure in her father and maternal aunt.    ROS: All other systems are reviewed and negative. Unless otherwise mentioned in H&P    PHYSICAL EXAM: VS:  BP (!) 160/74   Pulse 81   Ht 5' 3"  (1.6 m)  Wt 137 lb (62.1 kg)   SpO2 97%   BMI 24.27 kg/m  , BMI Body mass index is 24.27 kg/m. GEN: Well nourished, well developed, in no acute distress Thin HEENT: normal  Neck: no JVD, carotid bruits, or masses Cardiac: RRR  1/6 systolic murmur, no, rubs, or gallops,no edema  Respiratory:  clear to auscultation bilaterally, normal work of breathing GI: soft, nontender, nondistended, + BS MS: no deformity or atrophy  Skin: warm and dry, no rash Neuro:  Strength and sensation are intact Psych: euthymic mood, full affect   Recent Labs: 09/23/2016: TSH 0.503 11/08/2016: ALT 22 12/08/2016: BUN 9; Creatinine, Ser 0.79; Hemoglobin 11.4; Magnesium 2.0; Platelets 143; Potassium 4.4; Sodium 142    Lipid Panel    Component Value Date/Time   CHOL 206 (H) 12/08/2016 0554   CHOL 142 12/07/2013 1038   TRIG 136 12/08/2016 0554   TRIG 98 12/07/2013 1038   HDL 56 12/08/2016 0554   HDL 47 12/07/2013 1038   CHOLHDL 3.7 12/08/2016 0554   VLDL 27 12/08/2016 0554   LDLCALC 123 (H) 12/08/2016 0554   LDLCALC 75 12/07/2013 1038      Wt Readings from Last 3 Encounters:  01/13/17 137 lb (62.1 kg)  12/18/16 144 lb (65.3 kg)  12/16/16 144 lb 4.8 oz (65.5 kg)      Other studies Reviewed: Echocardiogram 22-Nov-2016.  Left ventricle:  The cavity size was normal. Wall thickness was   increased in a pattern of mild LVH. Systolic function was   vigorous. The estimated ejection fraction was in the range of 65%   to 70%. Wall motion was normal; there were no regional wall   motion abnormalities. The study is not technically sufficient to   allow evaluation of LV diastolic function. - Aortic valve: Poorly visualized. Mildly calcified leaflets. There   was mild to moderate regurgitation. Mean gradient (S): 15 mm Hg.   Valve area (VTI): 1.83 cm^2. - Mitral valve: Moderately to severely calcified annulus. There was   trivial regurgitation. - Left atrium: The atrium was mildly dilated. - Tricuspid valve: There was trivial regurgitation. - Pulmonary arteries: Systolic pressure could not be accurately   estimated. - Pericardium, extracardiac: A small pericardial effusion was   identified anterior to the heart.  Cardiac Cath 09/26/2016  Conclusion     The left ventricular systolic function is normal.  LV end diastolic pressure is normal.  The left ventricular ejection fraction is 55-65% by visual estimate.  There is mild aortic valve stenosis.  Mid RCA lesion, 25 %stenosed.  Ost Ramus lesion, 30 %stenosed.    Essentially minimal coronary artery disease withlesions. There is calcification of the mitral valve annulus and mild callus complication the proximal LAD. There does appear to be a stent in the RCA that is essentially patent.    ASSESSMENT AND PLAN:  1.  Hypertension: Blood pressure is elevated on this visit, I rechecked it again after allowing her to rest, and found it to be 170/78. The patient states that she has taken her medication today. She is currently taking lisinopril 20 mg daily at bedtime. Continues on isosorbide 30 mg at bedtime, and carvedilol 25 mg twice a day. This does not seem to be daily blood pressure under control at this office visit.   I have asked her to take her blood pressure at home  and record it and bring it back with her next office visit. I've advised her that we like to see her blood pressure 130/70  on average. I am not certain that she is correctly taking her medications but we will have to trust this is so until proven otherwise. It is noted that she does take ibuprofen when necessary which may be contributing to elevated blood pressure. We'll need to confirm that she is taking higher dose of lisinopril as directed as her med rec stated she is only taking 10 mg versus 20 mg.   2. Atrial tachycardia: She was started on diltiazem 120 mg XL on last office visit. May need to increase to 180 mg daily.   3. Chest pain: No complaints of chest pain on this office visit.   Current medicines are reviewed at length with the patient today.    Labs/ tests ordered today include:  No orders of the defined types were placed in this encounter.    Disposition:   FU with 3 months  Signed, @Mid  February and now@  01/13/2017 4:48 PM    [facility]. [address] [place], [place] [address] Phone: [phone]; Fax: [phone]

## 2017-01-16 DIAGNOSIS — J343 Hypertrophy of nasal turbinates: Secondary | ICD-10-CM | POA: Diagnosis not present

## 2017-01-16 DIAGNOSIS — R070 Pain in throat: Secondary | ICD-10-CM | POA: Diagnosis not present

## 2017-01-16 DIAGNOSIS — J069 Acute upper respiratory infection, unspecified: Secondary | ICD-10-CM | POA: Diagnosis not present

## 2017-01-16 DIAGNOSIS — D696 Thrombocytopenia, unspecified: Secondary | ICD-10-CM | POA: Diagnosis not present

## 2017-01-20 DIAGNOSIS — H35372 Puckering of macula, left eye: Secondary | ICD-10-CM | POA: Insufficient documentation

## 2017-01-20 DIAGNOSIS — H5213 Myopia, bilateral: Secondary | ICD-10-CM | POA: Diagnosis not present

## 2017-01-20 DIAGNOSIS — E1140 Type 2 diabetes mellitus with diabetic neuropathy, unspecified: Secondary | ICD-10-CM | POA: Diagnosis not present

## 2017-01-20 DIAGNOSIS — E119 Type 2 diabetes mellitus without complications: Secondary | ICD-10-CM | POA: Diagnosis not present

## 2017-01-20 DIAGNOSIS — H524 Presbyopia: Secondary | ICD-10-CM | POA: Diagnosis not present

## 2017-01-20 DIAGNOSIS — Z961 Presence of intraocular lens: Secondary | ICD-10-CM | POA: Diagnosis not present

## 2017-01-20 DIAGNOSIS — H52203 Unspecified astigmatism, bilateral: Secondary | ICD-10-CM | POA: Diagnosis not present

## 2017-01-27 ENCOUNTER — Other Ambulatory Visit: Payer: Self-pay | Admitting: Adult Health

## 2017-01-27 DIAGNOSIS — E1142 Type 2 diabetes mellitus with diabetic polyneuropathy: Secondary | ICD-10-CM | POA: Diagnosis not present

## 2017-01-27 DIAGNOSIS — L851 Acquired keratosis [keratoderma] palmaris et plantaris: Secondary | ICD-10-CM | POA: Diagnosis not present

## 2017-01-27 DIAGNOSIS — B351 Tinea unguium: Secondary | ICD-10-CM | POA: Diagnosis not present

## 2017-01-27 MED ORDER — CARVEDILOL 25 MG PO TABS: 25.0000 mg | ORAL_TABLET | Freq: Two times a day (BID) | ORAL | 1 refills | Status: DC

## 2017-01-27 NOTE — Telephone Encounter (Signed)
Refilled coreg per request

## 2017-01-27 NOTE — Telephone Encounter (Signed)
°*  STAT* If patient is at the pharmacy, call can be transferred to refill team.   1. Which medications need to be refilled? (please list name of each medication and dose if known)  carvedilol (COREG) 25 MG tablet [[id number]]    2. Which pharmacy/location (including street and city if local pharmacy) is medication to be sent to? [facility]   3. Do they need a 30 day or 90 day supply?  90 day    Also, pt is still having problems w/ her sleep. Pt states she took 2 Ambien last night and she slept fine, states she wanted to make sure that would be ok and also, let K. Lawrence know she would run out of that Rx quicker with taking 2

## 2017-02-19 ENCOUNTER — Telehealth (INDEPENDENT_AMBULATORY_CARE_PROVIDER_SITE_OTHER): Payer: Self-pay | Admitting: Internal Medicine

## 2017-02-19 ENCOUNTER — Other Ambulatory Visit (INDEPENDENT_AMBULATORY_CARE_PROVIDER_SITE_OTHER): Payer: Self-pay | Admitting: Internal Medicine

## 2017-02-19 ENCOUNTER — Telehealth: Payer: Self-pay | Admitting: Cardiology

## 2017-02-19 DIAGNOSIS — R110 Nausea: Secondary | ICD-10-CM

## 2017-02-19 DIAGNOSIS — Z79899 Other long term (current) drug therapy: Secondary | ICD-10-CM

## 2017-02-19 MED ORDER — ONDANSETRON 8 MG PO TBDP: 8.0000 mg | ORAL_TABLET | Freq: Three times a day (TID) | ORAL | 0 refills | Status: DC | PRN

## 2017-02-19 NOTE — Telephone Encounter (Signed)
Returned pt call, she complains of her heart beating really fast. She has no way of telling how fast as" she cannot count every beat." She states that it is causing her feet and legs to feel numb at times. She has no SOB, no CP or tightness. She is scheduled to see you tomorrow, but wants to know if it anything she can do in the meantime to slow her heart rate? Please advise.

## 2017-02-19 NOTE — Telephone Encounter (Signed)
Have her take an extra 1/2 tablet of her coreg, that would be 12.54m. If feels ok can wait to evaluate tomorrow, if severe symptoms needs to come to ER   JZandra AbtsMD

## 2017-02-19 NOTE — Telephone Encounter (Signed)
Patient called, requesting a script for Zofran.  She's called the pharmacy for a refill and they will not fill it until tomorrow and she stated that she needs it today as she is nauseated.  She would appreciate it if you'd call it into [facility].  [phone]

## 2017-02-19 NOTE — Telephone Encounter (Signed)
Let patient know I sent Rx in

## 2017-02-19 NOTE — Telephone Encounter (Signed)
Hope, Let patient know I sent Rx in

## 2017-02-19 NOTE — Telephone Encounter (Signed)
Per pt phone call-- having a fast heartbeat x 3 days, scheduled her to see Dr. Harl Bowie tomorrow @ 10:20 as that was first available, please give her a call at [phone]

## 2017-02-19 NOTE — Telephone Encounter (Signed)
Patient notified, will see how she feels

## 2017-02-20 ENCOUNTER — Other Ambulatory Visit (HOSPITAL_COMMUNITY)
Admission: RE | Admit: 2017-02-20 | Discharge: 2017-02-20 | Disposition: A | Discharge status: Home / Self Care | Payer: Medicare Other | Source: Ambulatory Visit | Attending: Cardiology | Admitting: Cardiology

## 2017-02-20 ENCOUNTER — Encounter: Payer: Self-pay | Admitting: Cardiology

## 2017-02-20 ENCOUNTER — Ambulatory Visit (INDEPENDENT_AMBULATORY_CARE_PROVIDER_SITE_OTHER): Payer: Medicare Other | Admitting: Cardiology

## 2017-02-20 VITALS — BP 136/68 | HR 76 | Ht 63.0 in | Wt 130.0 lb

## 2017-02-20 DIAGNOSIS — R000 Tachycardia, unspecified: Secondary | ICD-10-CM | POA: Diagnosis not present

## 2017-02-20 DIAGNOSIS — R002 Palpitations: Secondary | ICD-10-CM | POA: Diagnosis not present

## 2017-02-20 DIAGNOSIS — I2510 Atherosclerotic heart disease of native coronary artery without angina pectoris: Secondary | ICD-10-CM | POA: Diagnosis not present

## 2017-02-20 DIAGNOSIS — I6523 Occlusion and stenosis of bilateral carotid arteries: Secondary | ICD-10-CM

## 2017-02-20 LAB — BASIC METABOLIC PANEL
ANION GAP: 9 (ref 5–15)
BUN: 21 mg/dL — AB (ref 6–20)
CHLORIDE: 106 mmol/L (ref 101–111)
CO2: 21 mmol/L — ABNORMAL LOW (ref 22–32)
Calcium: 9.1 mg/dL (ref 8.9–10.3)
Creatinine, Ser: 1.09 mg/dL — ABNORMAL HIGH (ref 0.44–1.00)
GFR calc non Af Amer: 49 mL/min — ABNORMAL LOW (ref >60)
GFR, EST AFRICAN AMERICAN: 57 mL/min — AB (ref >60)
Glucose, Bld: 197 mg/dL — ABNORMAL HIGH (ref 65–99)
POTASSIUM: 3.9 mmol/L (ref 3.5–5.1)
SODIUM: 136 mmol/L (ref 135–145)

## 2017-02-20 LAB — MAGNESIUM: MAGNESIUM: 1.9 mg/dL (ref 1.7–2.4)

## 2017-02-20 LAB — TSH: TSH: 0.382 u[IU]/mL (ref 0.350–4.500)

## 2017-02-20 NOTE — Patient Instructions (Signed)
Your physician recommends that you schedule a follow-up appointment in: 4 months with Dr Harl Bowie    Your physician recommends that you continue on your current medications as directed. Please refer to the Current Medication list given to you today.   If you need a refill on your cardiac medications before your next appointment, please call your pharmacy.    Get lab work now :bmet,tsh,magnesium     Thank you for choosing [facility] !

## 2017-02-20 NOTE — Progress Notes (Signed)
Clinical Summary Ms. Delay is a 76 y.o.female seen today for follow up of the following medical problems.   1. Palpitaions/PSVT - previous monitor showed infrequent PACs. Clinically from notes suspected PSVT but does not appear ever captured by monitoring.  - occasional palpitations, 1-2 episdoes per month. Lasts just a few seconds.     - 3 days of increased palpitations. Comes and goes, last 24 hrs symptoms have improved - would last for few minutes times. No chest pain. No other symptoms.   2. CAD - history of MI in 1995, received 2 stents to RCA -  cath 2007 with patent coronaries.  - cath Jan 2018 nonobstructive disease - echo 10/2016 LVEF 65-70%, no WMAs  - admit 10/2016 with elevated troponin to 1.33, Thought to be demand ischemia related to her SVT at the time - admit 12/2016 with chest pain. Mild troponin 0.32 at that time. Managed medically due to recent normal cath and stable echo  - denies any significant chest pain since our last visit.   3. Carotid stenosis - 04/2016 moderate bilateral disease - no stroke like symptoms.   4. Cryptogenic cirrhosis - followed by GI   Past Medical History:  Diagnosis Date  . Anemia   . Aortic insufficiency    Moderate  . Back pain   . Carotid stenosis, bilateral   . Cirrhosis ([place])   . Coronary artery disease    Stent x 2 RCA 1995, cardiac catheterization 09/2016 showing only mild atherosclerosis  . DDD (degenerative disc disease), lumbar   . Essential hypertension   . GERD (gastroesophageal reflux disease)   . Hiatal hernia   . History of stroke   . Hypercholesteremia   . IBS (irritable bowel syndrome)   . Myocardial infarction ([place]) 1995  . Non-alcoholic fatty liver disease   . OSA (obstructive sleep apnea)    Sleep study 2009  AHI 9.91/hr and during REM sleep 35.58/hr  . Pericardial effusion    a. small by echo 2018.  Marland Kitchen PSVT (paroxysmal supraventricular tachycardia) ([place])   . Recurrent UTI   . Type 2  diabetes mellitus ([place])   . Varices, esophageal ([facility])      Allergies  Allergen Reactions  . Tape Rash     Current Outpatient Prescriptions  Medication Sig Dispense Refill  . acetaminophen (TYLENOL) 500 MG tablet Take 1,000 mg by mouth 2 (two) times daily.    Marland Kitchen aspirin 81 MG chewable tablet Chew 243 mg by mouth at bedtime.    Marland Kitchen atorvastatin (LIPITOR) 40 MG tablet Take 40 mg by mouth at bedtime.     . carvedilol (COREG) 25 MG tablet Take 1 tablet (25 mg total) by mouth 2 (two) times daily. 180 tablet 1  . clopidogrel (PLAVIX) 75 MG tablet Take 1 tablet (75 mg total) by mouth daily. (Patient taking differently: Take 75 mg by mouth at bedtime. ) 30 tablet 2  . diltiazem (CARDIZEM CD) 120 MG 24 hr capsule Take 1 capsule (120 mg total) by mouth daily. 90 capsule 3  . ibuprofen (ADVIL,MOTRIN) 200 MG tablet Take 400 mg by mouth 2 (two) times daily.     . isosorbide mononitrate (IMDUR) 30 MG 24 hr tablet Take 30 mg by mouth at bedtime.     Marland Kitchen lisinopril (PRINIVIL,ZESTRIL) 20 MG tablet Take 20 mg by mouth daily.    . ondansetron (ZOFRAN ODT) 8 MG disintegrating tablet Take 1 tablet (8 mg total) by mouth every 8 (eight) hours as needed  for nausea or vomiting. 20 tablet 0  . pantoprazole (PROTONIX) 40 MG tablet Take 40 mg by mouth at bedtime.    . potassium chloride SA (K-DUR,KLOR-CON) 20 MEQ tablet Take 1 tablet (20 mEq total) by mouth daily. 90 tablet 3  . zolpidem (AMBIEN) 10 MG tablet Take 1 tablet (10 mg total) by mouth at bedtime. Refills to be sent to PCP 10 tablet 0   No current facility-administered medications for this visit.      Past Surgical History:  Procedure Laterality Date  . [place]  . APPENDECTOMY    . BACK SURGERY  1985  . CARDIAC CATHETERIZATION  [phone]   Stent to the proximal RCA after MI   . CARDIAC CATHETERIZATION N/A 09/26/2016   Procedure: Left Heart Cath and Coronary Angiography;  Surgeon: Leonie Man, MD;  Location: [facility];  Service: Cardiovascular;  Laterality: N/A;  . CATARACT EXTRACTION W/PHACO Right 07/04/2014   Procedure: CATARACT EXTRACTION PHACO AND INTRAOCULAR LENS PLACEMENT (IOC);  Surgeon: Elta Guadeloupe T. Gershon Crane, MD;  Location[facility];  Service: Ophthalmology;  Laterality: Right;  CDE:13.13  . COLONOSCOPY    . DILATION AND CURETTAGE OF UTERUS     x2  . Epi Retinal Membrane Peel Left   . ERCP    . ESOPHAGEAL BANDING N/A 04/01/2013   Procedure: ESOPHAGEAL BANDING;  Surgeon: Rogene Houston, MD;  [facility];  Service: Endoscopy;  Laterality: N/A;  . ESOPHAGEAL BANDING N/A 05/24/2013   Procedure: ESOPHAGEAL BANDING;  Surgeon: Rogene Houston, MD;  [facility];  Service: Endoscopy;  Laterality: N/A;  . ESOPHAGEAL BANDING N/A 06/21/2014   Procedure: ESOPHAGEAL BANDING;  Surgeon: Rogene Houston, MD;  [facility];  Service: Endoscopy;  Laterality: N/A;  . ESOPHAGOGASTRODUODENOSCOPY N/A 04/01/2013   Procedure: ESOPHAGOGASTRODUODENOSCOPY (EGD);  Surgeon: Rogene Houston, MD;  [facility];  Service: Endoscopy;  Laterality: N/A;  230-rescheduled to 8:30am Ann notified pt  . ESOPHAGOGASTRODUODENOSCOPY N/A 05/24/2013   Procedure: ESOPHAGOGASTRODUODENOSCOPY (EGD);  Surgeon: Rogene Houston, MD;  [facility];  Service: Endoscopy;  Laterality: N/A;  730  . ESOPHAGOGASTRODUODENOSCOPY N/A 06/21/2014   Procedure: ESOPHAGOGASTRODUODENOSCOPY (EGD);  Surgeon: Rogene Houston, MD;  [facility];  Service: Endoscopy;  Laterality: N/A;  930-rescheduled 10/14 @ 1200 Ann to notify pt  . EYE SURGERY  08   cataract surgery of the left eye  . HARDWARE REMOVAL Right 01/17/2013   Procedure: REMOVAL OF HARDWARE AND EXCISION ULNAR STYLOID RIGHT WRIST;  Surgeon: Tennis Must, MD;  Location: [facility];  Service: Orthopedics;  Laterality: Right;  . MYRINGOTOMY  2012   both ears  . TONSILLECTOMY    . VAGINAL HYSTERECTOMY  1972  . WRIST SURGERY     rt  wrist hardwear removal     Allergies  Allergen Reactions  . Tape Rash      Family History  Problem Relation Age of Onset  . Diabetes Mother   . Heart failure Father   . Heart failure Maternal Aunt      Social History Ms. Gritz reports that she quit smoking about 5 years ago. Her smoking use included Cigarettes. She has a 12.50 pack-year smoking history. She has never used smokeless tobacco. Ms. Hemmerich reports that she does not drink alcohol.   Review of Systems CONSTITUTIONAL: No weight loss, fever, chills, weakness or fatigue.  HEENT: Eyes: No visual loss, blurred vision,  double vision or yellow sclerae.No hearing loss, sneezing, congestion, runny nose or sore throat.  SKIN: No rash or itching.  CARDIOVASCULAR:per hpi  RESPIRATORY: No shortness of breath, cough or sputum.  GASTROINTESTINAL: No anorexia, nausea, vomiting or diarrhea. No abdominal pain or blood.  GENITOURINARY: No burning on urination, no polyuria NEUROLOGICAL: No headache, dizziness, syncope, paralysis, ataxia, numbness or tingling in the extremities. No change in bowel or bladder control.  MUSCULOSKELETAL: No muscle, back pain, joint pain or stiffness.  LYMPHATICS: No enlarged nodes. No history of splenectomy.  PSYCHIATRIC: No history of depression or anxiety.  ENDOCRINOLOGIC: No reports of sweating, cold or heat intolerance. No polyuria or polydipsia.  Marland Kitchen   Physical Examination Vitals:   02/20/17 1029  BP: 136/68  Pulse: 76   Vitals:   02/20/17 1029  Weight: 130 lb (59 kg)  Height: 5' 3"  (1.6 m)    Gen: resting comfortably, no acute distress HEENT: no scleral icterus, pupils equal round and reactive, no palptable cervical adenopathy,  CV: RRR, no m/r/g, no jvd Resp: Clear to auscultation bilaterally GI: abdomen is soft, non-tender, non-distended, normal bowel sounds, no hepatosplenomegaly MSK: extremities are warm, no edema.  Skin: warm, no rash Neuro:  no focal deficits Psych: appropriate  affect      Assessment and Plan   1. Palpitations - recent 3 days of palpitions that are improved. Other than this episode she has done quite well recently.   - EKG in clinic shows NSR Counseled ok to take additional 1/2 tables of her coreg on days where symptoms are increased. If recurrent palpitations we can adjust her daily regimen    2.  CAD -recent cath with nonobstructive disease. - no recent chest pain, continue to monitor.   3. Carotid stenosis - moderate bilateral disease, continue to monitor         Arnoldo Lenis, M.D.

## 2017-02-23 NOTE — Telephone Encounter (Signed)
Pt made aware, will mail lab slip. Pt states that she does not take a lot of NSAID'S. She understands that she needs to drink more water.

## 2017-02-23 NOTE — Telephone Encounter (Signed)
-----   Message from Arnoldo Lenis, MD sent at 02/23/2017  3:46 PM EDT ----- Labs show a mild decrease in her kidney function. She needs to make sure she is staying hydrated, at least 4-6 bottles of water a day. Does she take much iburpofren, motrin, or aleve? These can also affect kidney function. Repeat BMET in 1 month   Zandra Abts MD

## 2017-02-23 NOTE — Telephone Encounter (Signed)
Called pt. Left message for pt to return call.

## 2017-02-24 ENCOUNTER — Telehealth: Payer: Self-pay | Admitting: Cardiology

## 2017-02-24 NOTE — Telephone Encounter (Signed)
Both are rare side effects from diltiazem. I would suggest she touch base with her GI doctor, if they think its medication related and nothing else is going on then we can consider some medication changes.    J BranchMD

## 2017-02-24 NOTE — Telephone Encounter (Signed)
Will forward to Dr. Branch. 

## 2017-02-24 NOTE — Telephone Encounter (Signed)
Pt will seek GI care

## 2017-02-24 NOTE — Telephone Encounter (Signed)
Per VM --pt states she's going to have to stop taking the diltiazem (CARDIZEM CD) 120 MG 24 hr capsule [[id number]]  It's causing her to have diarrhea and problems swallowing. Please give her a call @ [phone]

## 2017-02-26 ENCOUNTER — Encounter (HOSPITAL_COMMUNITY): Payer: Self-pay | Admitting: Cardiology

## 2017-02-26 ENCOUNTER — Emergency Department (HOSPITAL_COMMUNITY): Payer: Medicare Other

## 2017-02-26 ENCOUNTER — Observation Stay (HOSPITAL_BASED_OUTPATIENT_CLINIC_OR_DEPARTMENT_OTHER)
Admission: EM | Admit: 2017-02-26 | Discharge: 2017-02-27 | Disposition: A | Discharge status: Home / Self Care | Payer: Medicare Other | Source: Home / Self Care | Attending: Emergency Medicine | Admitting: Emergency Medicine

## 2017-02-26 DIAGNOSIS — R110 Nausea: Secondary | ICD-10-CM | POA: Insufficient documentation

## 2017-02-26 DIAGNOSIS — S72142A Displaced intertrochanteric fracture of left femur, initial encounter for closed fracture: Secondary | ICD-10-CM | POA: Diagnosis not present

## 2017-02-26 DIAGNOSIS — R000 Tachycardia, unspecified: Secondary | ICD-10-CM | POA: Diagnosis not present

## 2017-02-26 DIAGNOSIS — Z79899 Other long term (current) drug therapy: Secondary | ICD-10-CM | POA: Insufficient documentation

## 2017-02-26 DIAGNOSIS — I252 Old myocardial infarction: Secondary | ICD-10-CM | POA: Diagnosis not present

## 2017-02-26 DIAGNOSIS — G4733 Obstructive sleep apnea (adult) (pediatric): Secondary | ICD-10-CM | POA: Diagnosis not present

## 2017-02-26 DIAGNOSIS — J9811 Atelectasis: Secondary | ICD-10-CM | POA: Diagnosis not present

## 2017-02-26 DIAGNOSIS — I352 Nonrheumatic aortic (valve) stenosis with insufficiency: Secondary | ICD-10-CM | POA: Diagnosis not present

## 2017-02-26 DIAGNOSIS — Z87891 Personal history of nicotine dependence: Secondary | ICD-10-CM | POA: Insufficient documentation

## 2017-02-26 DIAGNOSIS — D696 Thrombocytopenia, unspecified: Secondary | ICD-10-CM | POA: Diagnosis not present

## 2017-02-26 DIAGNOSIS — R778 Other specified abnormalities of plasma proteins: Secondary | ICD-10-CM

## 2017-02-26 DIAGNOSIS — E119 Type 2 diabetes mellitus without complications: Secondary | ICD-10-CM | POA: Insufficient documentation

## 2017-02-26 DIAGNOSIS — R072 Precordial pain: Secondary | ICD-10-CM | POA: Insufficient documentation

## 2017-02-26 DIAGNOSIS — K760 Fatty (change of) liver, not elsewhere classified: Secondary | ICD-10-CM | POA: Diagnosis not present

## 2017-02-26 DIAGNOSIS — I6621 Occlusion and stenosis of right posterior cerebral artery: Secondary | ICD-10-CM | POA: Diagnosis not present

## 2017-02-26 DIAGNOSIS — Z955 Presence of coronary angioplasty implant and graft: Secondary | ICD-10-CM | POA: Diagnosis not present

## 2017-02-26 DIAGNOSIS — R079 Chest pain, unspecified: Secondary | ICD-10-CM | POA: Diagnosis present

## 2017-02-26 DIAGNOSIS — I2510 Atherosclerotic heart disease of native coronary artery without angina pectoris: Secondary | ICD-10-CM

## 2017-02-26 DIAGNOSIS — G9340 Encephalopathy, unspecified: Secondary | ICD-10-CM | POA: Diagnosis not present

## 2017-02-26 DIAGNOSIS — R748 Abnormal levels of other serum enzymes: Secondary | ICD-10-CM

## 2017-02-26 DIAGNOSIS — K219 Gastro-esophageal reflux disease without esophagitis: Secondary | ICD-10-CM | POA: Diagnosis not present

## 2017-02-26 DIAGNOSIS — Z8673 Personal history of transient ischemic attack (TIA), and cerebral infarction without residual deficits: Secondary | ICD-10-CM | POA: Diagnosis not present

## 2017-02-26 DIAGNOSIS — R7989 Other specified abnormal findings of blood chemistry: Secondary | ICD-10-CM

## 2017-02-26 DIAGNOSIS — I471 Supraventricular tachycardia: Secondary | ICD-10-CM | POA: Diagnosis not present

## 2017-02-26 DIAGNOSIS — Z7982 Long term (current) use of aspirin: Secondary | ICD-10-CM

## 2017-02-26 DIAGNOSIS — R0789 Other chest pain: Secondary | ICD-10-CM | POA: Diagnosis not present

## 2017-02-26 DIAGNOSIS — I351 Nonrheumatic aortic (valve) insufficiency: Secondary | ICD-10-CM | POA: Diagnosis not present

## 2017-02-26 DIAGNOSIS — E785 Hyperlipidemia, unspecified: Secondary | ICD-10-CM | POA: Diagnosis not present

## 2017-02-26 DIAGNOSIS — I10 Essential (primary) hypertension: Secondary | ICD-10-CM

## 2017-02-26 DIAGNOSIS — G40901 Epilepsy, unspecified, not intractable, with status epilepticus: Secondary | ICD-10-CM | POA: Diagnosis not present

## 2017-02-26 DIAGNOSIS — E876 Hypokalemia: Secondary | ICD-10-CM

## 2017-02-26 DIAGNOSIS — I6522 Occlusion and stenosis of left carotid artery: Secondary | ICD-10-CM | POA: Diagnosis not present

## 2017-02-26 DIAGNOSIS — R600 Localized edema: Secondary | ICD-10-CM | POA: Diagnosis not present

## 2017-02-26 DIAGNOSIS — K7460 Unspecified cirrhosis of liver: Secondary | ICD-10-CM | POA: Diagnosis not present

## 2017-02-26 DIAGNOSIS — R569 Unspecified convulsions: Secondary | ICD-10-CM | POA: Diagnosis not present

## 2017-02-26 DIAGNOSIS — E7800 Pure hypercholesterolemia, unspecified: Secondary | ICD-10-CM | POA: Diagnosis not present

## 2017-02-26 DIAGNOSIS — D62 Acute posthemorrhagic anemia: Secondary | ICD-10-CM | POA: Diagnosis not present

## 2017-02-26 LAB — BASIC METABOLIC PANEL
Anion gap: 12 (ref 5–15)
BUN: 19 mg/dL (ref 6–20)
CALCIUM: 9.2 mg/dL (ref 8.9–10.3)
CO2: 19 mmol/L — AB (ref 22–32)
CREATININE: 0.95 mg/dL (ref 0.44–1.00)
Chloride: 107 mmol/L (ref 101–111)
GFR calc non Af Amer: 58 mL/min — ABNORMAL LOW (ref >60)
Glucose, Bld: 194 mg/dL — ABNORMAL HIGH (ref 65–99)
Potassium: 3.2 mmol/L — ABNORMAL LOW (ref 3.5–5.1)
SODIUM: 138 mmol/L (ref 135–145)

## 2017-02-26 LAB — CBC
HCT: 35.5 % — ABNORMAL LOW (ref 36.0–46.0)
Hemoglobin: 11.6 g/dL — ABNORMAL LOW (ref 12.0–15.0)
MCH: 27.8 pg (ref 26.0–34.0)
MCHC: 32.7 g/dL (ref 30.0–36.0)
MCV: 85.1 fL (ref 78.0–100.0)
PLATELETS: 167 10*3/uL (ref 150–400)
RBC: 4.17 MIL/uL (ref 3.87–5.11)
RDW: 14.9 % (ref 11.5–15.5)
WBC: 8.5 10*3/uL (ref 4.0–10.5)

## 2017-02-26 LAB — TROPONIN I
Troponin I: 0.32 ng/mL (ref <0.03)
Troponin I: 0.35 ng/mL (ref <0.03)
Troponin I: 0.35 ng/mL (ref <0.03)

## 2017-02-26 LAB — PROTIME-INR
INR: 1.16
Prothrombin Time: 14.9 seconds (ref 11.4–15.2)

## 2017-02-26 LAB — GLUCOSE, CAPILLARY: GLUCOSE-CAPILLARY: 163 mg/dL — AB (ref 65–99)

## 2017-02-26 MED ORDER — ASPIRIN 81 MG PO CHEW
324.0000 mg | CHEWABLE_TABLET | Freq: Once | ORAL | Status: AC
  Administered 2017-02-26: 324 mg via ORAL
  Filled 2017-02-26: qty 4

## 2017-02-26 MED ORDER — HEPARIN (PORCINE) IN NACL 100-0.45 UNIT/ML-% IJ SOLN
INTRAMUSCULAR | Status: AC
  Filled 2017-02-26: qty 250

## 2017-02-26 MED ORDER — ONDANSETRON HCL 4 MG/2ML IJ SOLN
4.0000 mg | Freq: Four times a day (QID) | INTRAMUSCULAR | Status: DC | PRN
  Administered 2017-02-26: 4 mg via INTRAVENOUS
  Filled 2017-02-26: qty 2

## 2017-02-26 MED ORDER — IBUPROFEN 400 MG PO TABS
200.0000 mg | ORAL_TABLET | Freq: Four times a day (QID) | ORAL | Status: DC | PRN
  Administered 2017-02-27 (×2): 200 mg via ORAL
  Filled 2017-02-26 (×2): qty 1

## 2017-02-26 MED ORDER — ATORVASTATIN CALCIUM 40 MG PO TABS
40.0000 mg | ORAL_TABLET | Freq: Every day | ORAL | Status: DC
  Administered 2017-02-26: 40 mg via ORAL
  Filled 2017-02-26: qty 1

## 2017-02-26 MED ORDER — SODIUM CHLORIDE 0.9 % IV SOLN: 250.0000 mL | INTRAVENOUS | Status: DC | PRN

## 2017-02-26 MED ORDER — CARVEDILOL 12.5 MG PO TABS: 25.0000 mg | ORAL_TABLET | Freq: Two times a day (BID) | ORAL | Status: DC

## 2017-02-26 MED ORDER — ACETAMINOPHEN 650 MG RE SUPP: 650.0000 mg | Freq: Four times a day (QID) | RECTAL | Status: DC | PRN

## 2017-02-26 MED ORDER — ACETAMINOPHEN 325 MG PO TABS
650.0000 mg | ORAL_TABLET | Freq: Four times a day (QID) | ORAL | Status: DC | PRN
  Administered 2017-02-26: 650 mg via ORAL
  Filled 2017-02-26: qty 2

## 2017-02-26 MED ORDER — POTASSIUM CHLORIDE 20 MEQ PO PACK
40.0000 meq | PACK | ORAL | Status: AC
  Administered 2017-02-26 (×2): 40 meq via ORAL
  Filled 2017-02-26 (×2): qty 2

## 2017-02-26 MED ORDER — METOPROLOL TARTRATE 25 MG PO TABS: 25.0000 mg | ORAL_TABLET | Freq: Two times a day (BID) | ORAL | Status: DC

## 2017-02-26 MED ORDER — DILTIAZEM HCL ER COATED BEADS 120 MG PO CP24: 120.0000 mg | ORAL_CAPSULE | Freq: Every day | ORAL | Status: DC

## 2017-02-26 MED ORDER — SODIUM CHLORIDE 0.9% FLUSH: 3.0000 mL | INTRAVENOUS | Status: DC | PRN

## 2017-02-26 MED ORDER — SODIUM CHLORIDE 0.9% FLUSH
3.0000 mL | Freq: Two times a day (BID) | INTRAVENOUS | Status: DC
  Administered 2017-02-26 – 2017-02-27 (×2): 3 mL via INTRAVENOUS

## 2017-02-26 MED ORDER — METOPROLOL TARTRATE 25 MG PO TABS
12.5000 mg | ORAL_TABLET | Freq: Two times a day (BID) | ORAL | Status: DC
  Administered 2017-02-26: 12.5 mg via ORAL
  Filled 2017-02-26 (×2): qty 1

## 2017-02-26 MED ORDER — LISINOPRIL 10 MG PO TABS
20.0000 mg | ORAL_TABLET | Freq: Every day | ORAL | Status: DC
  Administered 2017-02-27: 20 mg via ORAL
  Filled 2017-02-26: qty 2

## 2017-02-26 MED ORDER — POTASSIUM CHLORIDE CRYS ER 20 MEQ PO TBCR: 20.0000 meq | EXTENDED_RELEASE_TABLET | Freq: Every day | ORAL | Status: DC

## 2017-02-26 MED ORDER — ONDANSETRON HCL 4 MG PO TABS
4.0000 mg | ORAL_TABLET | Freq: Four times a day (QID) | ORAL | Status: DC | PRN
Start: 2017-02-26 — End: 2017-02-27

## 2017-02-26 MED ORDER — ISOSORBIDE MONONITRATE ER 30 MG PO TB24
30.0000 mg | ORAL_TABLET | Freq: Every day | ORAL | Status: DC
  Administered 2017-02-26: 30 mg via ORAL
  Filled 2017-02-26: qty 1

## 2017-02-26 MED ORDER — PANTOPRAZOLE SODIUM 40 MG PO TBEC
40.0000 mg | DELAYED_RELEASE_TABLET | Freq: Every day | ORAL | Status: DC
  Administered 2017-02-26 – 2017-02-27 (×2): 40 mg via ORAL
  Filled 2017-02-26 (×2): qty 1

## 2017-02-26 MED ORDER — NITROGLYCERIN 2 % TD OINT
0.5000 [in_us] | TOPICAL_OINTMENT | Freq: Once | TRANSDERMAL | Status: AC
  Administered 2017-02-26: 0.5 [in_us] via TOPICAL
  Filled 2017-02-26: qty 1

## 2017-02-26 MED ORDER — HEPARIN BOLUS VIA INFUSION
3500.0000 [IU] | Freq: Once | INTRAVENOUS | Status: AC
Start: 2017-02-26 — End: 2017-02-26
  Administered 2017-02-26: 3500 [IU] via INTRAVENOUS

## 2017-02-26 MED ORDER — HEPARIN (PORCINE) IN NACL 100-0.45 UNIT/ML-% IJ SOLN
700.0000 [IU]/h | INTRAMUSCULAR | Status: DC
  Administered 2017-02-26: 700 [IU]/h via INTRAVENOUS

## 2017-02-26 MED ORDER — ZOLPIDEM TARTRATE 5 MG PO TABS
5.0000 mg | ORAL_TABLET | Freq: Once | ORAL | Status: AC
  Administered 2017-02-26: 5 mg via ORAL
  Filled 2017-02-26: qty 1

## 2017-02-26 MED ORDER — DILTIAZEM HCL ER COATED BEADS 180 MG PO CP24
180.0000 mg | ORAL_CAPSULE | Freq: Every day | ORAL | Status: DC
  Administered 2017-02-27: 180 mg via ORAL
  Filled 2017-02-26: qty 1

## 2017-02-26 MED ORDER — ASPIRIN 81 MG PO CHEW: 243.0000 mg | CHEWABLE_TABLET | Freq: Every day | ORAL | Status: DC

## 2017-02-26 NOTE — Care Management Obs Status (Signed)
[facility] NOTIFICATION   Patient Details  Name: [name] MRN: [id number] Date of Birth: Feb 02, 1941   Medicare Observation Status Notification Given:  Yes    Sherald Barge, RN 02/26/2017, 3:55 PM

## 2017-02-26 NOTE — Progress Notes (Signed)
Pt requesting something for sleep. Pt states she takes Ambien at home. Dr. On call paged. New order for Ambien 53m po x1 now.

## 2017-02-26 NOTE — Progress Notes (Signed)
ANTICOAGULATION CONSULT NOTE - Initial Consult  Pharmacy Consult for Heparin Indication: chest pain/ACS  Allergies  Allergen Reactions  . Tape Rash    Patient Measurements: Height: 5' 3"  (160 cm) Weight: 130 lb (59 kg) IBW/kg (Calculated) : 52.4 HEPARIN DW (KG): 59   Vital Signs: Temp: 97.7 F (36.5 C) (06/21 1216) Temp Source: Temporal (06/21 1216) BP: 105/43 (06/21 1400) Pulse Rate: 62 (06/21 1415)  Labs:  Recent Labs  02/26/17 1258  HGB 11.6*  HCT 35.5*  PLT 167  CREATININE 0.95  TROPONINI 0.32*    Estimated Creatinine Clearance: 43 mL/min (by C-G formula based on SCr of 0.95 mg/dL).   Medical History: Past Medical History:  Diagnosis Date  . Anemia   . Aortic insufficiency    Moderate  . Back pain   . Carotid stenosis, bilateral   . Cirrhosis ([place])   . Coronary artery disease    Stent x 2 RCA 1995, cardiac catheterization 09/2016 showing only mild atherosclerosis  . DDD (degenerative disc disease), lumbar   . Essential hypertension   . GERD (gastroesophageal reflux disease)   . Hiatal hernia   . History of stroke   . Hypercholesteremia   . IBS (irritable bowel syndrome)   . Myocardial infarction ([place]) 1995  . Non-alcoholic fatty liver disease   . OSA (obstructive sleep apnea)    Sleep study 2009  AHI 9.91/hr and during REM sleep 35.58/hr  . Pericardial effusion    a. small by echo 2018.  Marland Kitchen PSVT (paroxysmal supraventricular tachycardia) ([place])   . Recurrent UTI   . Type 2 diabetes mellitus ([place])   . Varices, esophageal (H[facility])     Medications:  See med rec  Assessment: 76 yo female presents to ED with chest pain that started yesterday. Patient has nausea, pin in substernal region. Initial troponin 0.32. Pharmacy asked to start heparin.  Goal of Therapy:  Heparin level 0.3-0.7 units/ml Monitor platelets by anticoagulation protocol: Yes   Plan:  Give 3500 units bolus x 1 Start heparin infusion at 700 units/hr Check anti-Xa level in 6-8   hours and daily while on heparin Continue to monitor H&H and platelets  Isac Sarna, BS Vena Austria, BCPS Clinical Pharmacist Pager [phone]  02/26/2017,2:47 PM

## 2017-02-26 NOTE — ED Notes (Addendum)
CRITICAL VALUE ALERT  Critical Value:  Troponin 0.32   Date & Time Notied:  02/26/2017 14:03  Provider Notified: Yes   Orders Received/Actions taken: Alerted MD

## 2017-02-26 NOTE — Consult Note (Addendum)
Cardiology Consultation:   Patient ID: Lateasha Breuer; [id number]; 01/03/1941   Admit date: 02/26/2017 Date of Consult: 02/26/2017  Primary Care Provider: Redmond School, MD Primary Cardiologist: [facility] Primary Electrophysiologist:  NA   Patient Profile:   Ambrielle Kington is a 76 y.o. female with a hx of PSVT, palpitations, coronary artery disease,  most recent cardiac catheterization January 2018 with nonobstructive disease,, carotid artery stenosis, and cryptogenic cirrhosis followed by GI, who is being seen today for the evaluation of chest pain, elevated troponin of at the request of Dr. Cathlean Sauer, Hospitalist.   History of Present Illness:   Ms. Gardiner is a 76 year old female patient with above-mentioned diagnoses, with most recent cardiac catheterization in January 2018 with nonobstructive CAD, who presented to the emergency room with complaints of chest pain. She is found to have elevated troponin 0.35. (Patient has chronically elevated troponin since March 2018, with troponin of 1.01 in March, 0.32 and April followed by 0.29, and 0.28, 0.32 in June 2018).   Initially on interviewing patient she states that she had chest pain which awakened her at night described as an "ache". She states that she took a diltiazem tablet and was able to go back to sleep. She awoke this morning with worse chest pain, and came to the emergency room.  As I continued to question her, the patient now reports that she did not have chest pain.  She reports that her heart rate was racing. She states she did not feel an ache in her chest. She states that she felt her heart was going fast, after she took diltiazem, heart rate slowed and she felt better and was able to sleep.. When she woke this morning she again felt her heart racing. She states by the time she got to the emergency room she it had resolved on its own.  On arrival to the emergency room the patient's blood pressure was 143/45, heart rate 92, O2 sat 97%,  she was afebrile. Pertinent labs revealed potassium of 3.2, glucose 194, creatinine 0.95. Hemoglobin 11.6, hematocrit 35.5, platelets 167. Chest x-ray reported no active cardiopulmonary disease. EKG revealed normal sinus rhythm, unchanged from previous EKGs in June, April 2018.  The patient was treated with aspirin, nitroglycerin 1/2 inch topically, started on a heparin drip, and given potassium replacement. We are asked for cardiology recommendations. The patient has had no further recurrence of racing heart rate or "chest pain". She denies medical noncompliance. She did states that she had some diarrhea yesterday.  Past Medical History:  Diagnosis Date  . Anemia   . Aortic insufficiency    Moderate  . Back pain   . Carotid stenosis, bilateral   . Cirrhosis ([place])   . Coronary artery disease    Stent x 2 RCA 1995, cardiac catheterization 09/2016 showing only mild atherosclerosis  . DDD (degenerative disc disease), lumbar   . Essential hypertension   . GERD (gastroesophageal reflux disease)   . Hiatal hernia   . History of stroke   . Hypercholesteremia   . IBS (irritable bowel syndrome)   . Myocardial infarction ([place]) 1995  . Non-alcoholic fatty liver disease   . OSA (obstructive sleep apnea)    Sleep study 2009  AHI 9.91/hr and during REM sleep 35.58/hr  . Pericardial effusion    a. small by echo 2018.  Marland Kitchen PSVT (paroxysmal supraventricular tachycardia) ([place])   . Recurrent UTI   . Type 2 diabetes mellitus ([place])   . Varices, esophageal (HCC)     Past  Surgical History:  Procedure Laterality Date  . [place]  . APPENDECTOMY    . BACK SURGERY  1985  . CARDIAC CATHETERIZATION  [phone]   Stent to the proximal RCA after MI   . CARDIAC CATHETERIZATION N/A 09/26/2016   Procedure: Left Heart Cath and Coronary Angiography;  Surgeon: Leonie Man, MD;  Location: [facility];  Service: Cardiovascular;  Laterality: N/A;  . CATARACT EXTRACTION W/PHACO Right  07/04/2014   Procedure: CATARACT EXTRACTION PHACO AND INTRAOCULAR LENS PLACEMENT (IOC);  Surgeon: Elta Guadeloupe T. Gershon Crane, MD;  Location: [facility];  Service: Ophthalmology;  Laterality: Right;  CDE:13.13  . COLONOSCOPY    . DILATION AND CURETTAGE OF UTERUS     x2  . Epi Retinal Membrane Peel Left   . ERCP    . ESOPHAGEAL BANDING N/A 04/01/2013   Procedure: ESOPHAGEAL BANDING;  Surgeon: Rogene Houston, MD;  Location: [facility];  Service: Endoscopy;  Laterality: N/A;  . ESOPHAGEAL BANDING N/A 05/24/2013   Procedure: ESOPHAGEAL BANDING;  Surgeon: Rogene Houston, MD;  [facility];  Service: Endoscopy;  Laterality: N/A;  . ESOPHAGEAL BANDING N/A 06/21/2014   Procedure: ESOPHAGEAL BANDING;  Surgeon: Rogene Houston, MD;  [facility];  Service: Endoscopy;  Laterality: N/A;  . ESOPHAGOGASTRODUODENOSCOPY N/A 04/01/2013   Procedure: ESOPHAGOGASTRODUODENOSCOPY (EGD);  Surgeon: Rogene Houston, MD;  [facility];  Service: Endoscopy;  Laterality: N/A;  230-rescheduled to 8:30am Ann notified pt  . ESOPHAGOGASTRODUODENOSCOPY N/A 05/24/2013   Procedure: ESOPHAGOGASTRODUODENOSCOPY (EGD);  Surgeon: Rogene Houston, MD;  [facility];  Service: Endoscopy;  Laterality: N/A;  730  . ESOPHAGOGASTRODUODENOSCOPY N/A 06/21/2014   Procedure: ESOPHAGOGASTRODUODENOSCOPY (EGD);  Surgeon: Rogene Houston, MD;  [facility];  Service: Endoscopy;  Laterality: N/A;  930-rescheduled 10/14 @ 1200 Ann to notify pt  . EYE SURGERY  08   cataract surgery of the left eye  . HARDWARE REMOVAL Right 01/17/2013   Procedure: REMOVAL OF HARDWARE AND EXCISION ULNAR STYLOID RIGHT WRIST;  Surgeon: Tennis Must, MD;  Location: [facility];  Service: Orthopedics;  Laterality: Right;  . MYRINGOTOMY  2012   both ears  . TONSILLECTOMY    . VAGINAL HYSTERECTOMY  1972  . WRIST SURGERY     rt wrist hardwear removal     Inpatient Medications: Scheduled Meds: . [START ON  02/27/2017] aspirin  243 mg Oral QHS  . atorvastatin  40 mg Oral QHS  . [START ON 02/27/2017] diltiazem  120 mg Oral Daily  . isosorbide mononitrate  30 mg Oral QHS  . [START ON 02/27/2017] lisinopril  20 mg Oral Daily  . metoprolol tartrate  25 mg Oral BID  . pantoprazole  40 mg Oral Daily  . potassium chloride  40 mEq Oral Q4H  . sodium chloride flush  3 mL Intravenous Q12H  . sodium chloride flush  3 mL Intravenous Q12H   Continuous Infusions: . sodium chloride     PRN Meds: sodium chloride, acetaminophen **OR** acetaminophen, ibuprofen, ondansetron **OR** ondansetron (ZOFRAN) IV, sodium chloride flush  Allergies:    Allergies  Allergen Reactions  . Tape Rash    Social History:   Social History   Social History  . Marital status: Widowed    Spouse name: N/A  . Number of children: 2  . Years of education: N/A   Occupational History  .  Retired   Social History Main Topics  .  Smoking status: Former Smoker    Packs/day: 0.25    Years: 50.00    Types: Cigarettes    Quit date: 01/13/2012  . Smokeless tobacco: Never Used  . Alcohol use No  . Drug use: No  . Sexual activity: Yes    Birth control/ protection: Surgical   Other Topics Concern  . Not on file   Social History Narrative  . No narrative on file    Family History:   The patient's family history includes Diabetes in her mother; Heart failure in her father and maternal aunt.  ROS:  Please see the history of present illness.  ROS  All other ROS reviewed and negative.     Physical Exam/Data:   Vitals:   02/26/17 1510 02/26/17 1515 02/26/17 1530 02/26/17 1618  BP:   (!) 115/53 (!) 126/48  Pulse: 75 77 72 77  Resp: 17 18 17 18   Temp:    97.7 F (36.5 C)  TempSrc:    Oral  SpO2: 100% 99% 98% 98%  Weight:    130 lb (59 kg)  Height:    5' 3"  (1.6 m)    Intake/Output Summary (Last 24 hours) at 02/26/17 1701 Last data filed at 02/26/17 1600  Gross per 24 hour  Intake             5.95 ml  Output                 0 ml  Net             5.95 ml   Filed Weights   02/26/17 1214 02/26/17 1618  Weight: 130 lb (59 kg) 130 lb (59 kg)   Body mass index is 23.03 kg/m.  General:  Well nourished, well developed, in no acute distress HEENT: normal Lymph: no adenopathy Neck: no JVD Endocrine:  No thryomegaly Vascular: Bilateral  carotid bruits; FA pulses 2+ bilaterally  Cardiac:  normal S1, S2; 5/9-1/6 holosystolic murmur,  RRR;  Lungs:  clear to auscultation bilaterally, no wheezing, rhonchi or rales  Abd: soft, nontender, no hepatomegaly  Ext: no edema Musculoskeletal:  No deformities, BUE and BLE strength normal and equal Skin: warm and dry  Neuro:  CNs 2-12 intact, no focal abnormalities noted Psych:  Normal affect   EKG:  The EKG was personally reviewed and demonstrates: Normal sinus rhythm  Telemetry:  Telemetry was personally reviewed and demonstrates:  NSR  Relevant CV Studies: Cardiac Catheterization October 05, 2016 Conclusion     The left ventricular systolic function is normal.  LV end diastolic pressure is normal.  The left ventricular ejection fraction is 55-65% by visual estimate.  There is mild aortic valve stenosis.  Mid RCA lesion, 25 %stenosed.  Ost Ramus lesion, 30 %stenosed.   Essentially minimal coronary artery disease withlesions. There is calcification of the mitral valve annulus and mild callus complication the proximal LAD. There does appear to be a stent in the RCA that is essentially patent. Nothing to explain the patient's positive troponin.  Echocardiogram 11/03/2016 - Left ventricle: The cavity size was normal. Wall thickness was   increased in a pattern of mild LVH. Systolic function was   vigorous. The estimated ejection fraction was in the range of 65%   to 70%. Wall motion was normal; there were no regional wall   motion abnormalities. The study is not technically sufficient to   allow evaluation of LV diastolic function. - Aortic valve: Poorly  visualized. Mildly calcified leaflets. There   was  mild to moderate regurgitation. Mean gradient (S): 15 mm Hg.   Valve area (VTI): 1.83 cm^2. - Mitral valve: Moderately to severely calcified annulus. There was   trivial regurgitation. - Left atrium: The atrium was mildly dilated. - Tricuspid valve: There was trivial regurgitation. - Pulmonary arteries: Systolic pressure could not be accurately   estimated. - Pericardium, extracardiac: A small pericardial effusion was   identified anterior to the heart.  Impressions:  - Mild LVH with LVEF 65-70%. Indeterminate diastolic function. Mild   left atrial enlargement. Moderate to severe mitral annular   calcification with trivial mitral regurgitation. Aortic valve   poorly visualized and appears sclerotic with associated mild to   moderate aortic regurgitation. Small anterior pericardial  Carotid Ultrasound 04/10/2016 RIGHT CAROTID ARTERY: Similar heterogeneous scattered right carotid system atherosclerosis with scattered calcification. Despite this, there is no hemodynamically significant right ICA stenosis, velocity elevation, or turbulent flow. Degree of narrowing remains less than 50%.  RIGHT VERTEBRAL ARTERY:  Antegrade  LEFT CAROTID ARTERY: More pronounced left carotid bifurcation atherosclerosis with partial calcification and echogenic shadowing. This narrows the carotid bifurcation by grayscale imaging. Proximal ICA velocity elevation measures 210/31 cm/sec. By ultrasound criteria, estimated left ICA stenosis remains 50- 69%.  LEFT VERTEBRAL ARTERY:  Antegrade  IMPRESSION: Left greater the right carotid atherosclerosis.  Moderate left ICA stenosis remains at 50- 69%  Minor right ICA narrowing, less than 50%  Patent antegrade vertebral flow bilaterally    effusion noted.  Laboratory Data:  Chemistry Recent Labs Lab 02/20/17 1110 02/26/17 1258  NA 136 138  K 3.9 3.2*  CL 106 107  CO2 21* 19*  GLUCOSE  197* 194*  BUN 21* 19  CREATININE 1.09* 0.95  CALCIUM 9.1 9.2  GFRNONAA 49* 58*  GFRAA 57* >60  ANIONGAP 9 12    No results for input(s): PROT, ALBUMIN, AST, ALT, ALKPHOS, BILITOT in the last 168 hours. Hematology Recent Labs Lab 02/26/17 1258  WBC 8.5  RBC 4.17  HGB 11.6*  HCT 35.5*  MCV 85.1  MCH 27.8  MCHC 32.7  RDW 14.9  PLT 167   Cardiac Enzymes Recent Labs Lab 02/26/17 1258 02/26/17 1610  TROPONINI 0.32* 0.35*   No results for input(s): TROPIPOC in the last 168 hours.  BNPNo results for input(s): BNP, PROBNP in the last 168 hours.  DDimer No results for input(s): DDIMER in the last 168 hours.  Radiology/Studies:  Dg Chest 2 View  Result Date: 02/26/2017 CLINICAL DATA:  CENTER CHEST PAIN SINCE LAST NIGHT, HX OF MI X 2, MOST RECENT 2 YEARS AGO, FORMER SMOKER X 4 YEARS EXAM: CHEST  2 VIEW COMPARISON:  12/07/2016 FINDINGS: Lungs are hyperinflated. Heart size is normal. There are no focal consolidations or pleural effusions. No pulmonary edema. Degenerative changes are seen in thoracic spine. IMPRESSION: No active cardiopulmonary disease. Electronically Signed   By: Nolon Nations M.D.   On: 02/26/2017 12:43    Assessment and Plan:   1. Hx of PSVT: Patient was admitted with chest pain, and this is what she initially told me on evaluation. However when asking her about symptoms of racing heart rate, she states that that was what she felt during the night. She did not have chest pain or achy feeling.   Uncertain which is true account. As she has changed her story during this interview. She may have experienced both.. She does have a history of PSVT. Has been told to take an extra dose of diltiazem should she feel her heart  racing. She denies chest pain now or over the last 24 hours. Her symptoms resolved prior to being seen in the emergency room. May consider increasing diltiazem to 180 mg daily as she has had recurrent symptoms of "racing heart rate," with resolution last  night with an extra dose of diltiazem.  2. Chronically elevated troponin: Troponins have been historically elevated since April 2018, as well as in February 2018. This was felt to be related to PSVT, demand ischemia. She had no evidence of wall motion abnormalities on echocardiogram and therefore catheterization was not completed in April on last hospitalization. Uncertain that she will need to be continued on heparin drip. No acute EKG changes indicative of ACS. Troponins are flat since April 2018.  3. CAD: Most recent cardiac catheterization January 2018. She was found to have minimal coronary artery disease without lesions. Patent stent to the right coronary artery. Would continue aspirin, statin therapy, carvedilol.  4. Hypokalemia: Patient's potassium was 3.3 and repleted. As she is on a PPI will check magnesium level.  5. Aortic insufficiency: Mlld to moderate regurgitation. Mean gradient (S): 15 mm Hg.Valve area (VTI): 1.83 cm^2. Prior echocardiogram completed in February 2018.     Signed, Jory Sims, NP  02/26/2017 5:01 PM   Pt seen and examined  Reviewed with Arnold Long as noted above and amended to reflect my findings  Pt is a 76 yo with history of minimal CAD by cath this year, chronic elevation of troponin (max in Feb 2018 >1), tachycardia and mild AS, moderate aortic insufficiency   She presented to ED today for evaluation of palpitations  Se says that she felt her heart racing  No CP  No SOB   No Dizzienss  Took an extra dose of dilt and eased. She is currently comfortable  ON exam:  Neck:  JVP nromal  LUngs are CTA  Cardiac exam  RRR  Gr II/VI diastolic murmur LSB to apex Abd:  Benign  Ext without edema  2+ pulses. EKG  SR without acute ST changes   I am int convinced of active coronary ischemia  Troponin may reflect some strain in setting of vigorous LV function and AI    Pt had symptomatic palpitations  WOuld continue tele to see if correlates with PSVT  Could consider  outpt monitor  I would recomm increasing diltiazem  Follow HR and BP Follow troponin   D/C NTG and heparin    Aortic insufficiency will need to be followed in long term with periodic echoes  She had one in Jan and February of this year    Dorris Carnes MD

## 2017-02-26 NOTE — H&P (Signed)
History and Physical    Meerab Maselli UVO:[id number] DOB: March 03, 1941 DOA: 02/26/2017  PCP: Redmond School, MD   Patient coming from: Home    Chief Complaint:  Precordial chest pain.   HPI: [name] is a 76 y.o. female with medical history significant ischemic heart disease with stents placed in the RCA in 1995, last coronary angiography in January 2018 with nonobstructive disease, echocardiography February 2018 with normal LV systolic function 74-25% ejection fraction with no wall motion abnormalities.   Presents with chest discomfort. Last night while she was in bed, she experienced palpitations associated with chest discomfort, 4 out of 10 in intensity, not exertion related, it remained persistent overnight, this morning her symptoms were improved, but she decided to come to the emergency room for further evaluation. Denies any associated symptoms, no improving or worsening factors. Denies any angina. She has been recently evaluated Dr. Harl Bowie in the outpatient cardiology clinic for supraventricular tachycardia and premature atrial complexes, she had a monitor in the past which did not show specific SVT but clinically this condition was highly suspected. She was told to increase her coreg by half tablet on days when she was more symptomatic, changes that apparently she has been not doing. 2 days ago she called Dr. Harl Bowie about intending to stop her diltiazem due to GI symptoms, swallowing problems and diarrhea.  ED Course: Elevated troponin, started on heparin drip and referred for admission.   Review of Systems:  1. General. No fevers no chills 2. ENT no runny nose or sore throat 3. Pulmonary. No shortness of breath cough or hemoptysis 4. Cardiovascular, positive for palpitations and chest pain, no angina or claudication 5. Gastrointestinal positive for swallowing dysfunction and diarrhea, no abdominal pain 6. Musculoskeletal no joint pain 7. Dermatology no rashes 8. Urology no  dysuria or increased urinary frequency 9. Hematology no easy bruisability or frequent infections 10. Neurology no seizures or paresthesias  Past Medical History:  Diagnosis Date  . Anemia   . Aortic insufficiency    Moderate  . Back pain   . Carotid stenosis, bilateral   . Cirrhosis ([place])   . Coronary artery disease    Stent x 2 RCA 1995, cardiac catheterization 09/2016 showing only mild atherosclerosis  . DDD (degenerative disc disease), lumbar   . Essential hypertension   . GERD (gastroesophageal reflux disease)   . Hiatal hernia   . History of stroke   . Hypercholesteremia   . IBS (irritable bowel syndrome)   . Myocardial infarction ([place]) 1995  . Non-alcoholic fatty liver disease   . OSA (obstructive sleep apnea)    Sleep study 2009  AHI 9.91/hr and during REM sleep 35.58/hr  . Pericardial effusion    a. small by echo 2018.  Marland Kitchen PSVT (paroxysmal supraventricular tachycardia) ([place])   . Recurrent UTI   . Type 2 diabetes mellitus ([place])   . Varices, esophageal ([place])     Past Surgical History:  Procedure Laterality Date  . [place]  . APPENDECTOMY    . BACK SURGERY  1985  . CARDIAC CATHETERIZATION  [phone]   Stent to the proximal RCA after MI   . CARDIAC CATHETERIZATION N/A 09/26/2016   Procedure: Left Heart Cath and Coronary Angiography;  Surgeon: Leonie Man, MD;  Location: [place] [facility];  Service: Cardiovascular;  Laterality: N/A;  . CATARACT EXTRACTION W/PHACO Right 07/04/2014   Procedure: CATARACT EXTRACTION PHACO AND INTRAOCULAR LENS PLACEMENT (IOC);  Surgeon: Elta Guadeloupe T. Gershon Crane, MD;  Location:  [facility];  Service: Ophthalmology;  Laterality: Right;  CDE:13.13  . COLONOSCOPY    . DILATION AND CURETTAGE OF UTERUS     x2  . Epi Retinal Membrane Peel Left   . ERCP    . ESOPHAGEAL BANDING N/A 04/01/2013   Procedure: ESOPHAGEAL BANDING;  Surgeon: Rogene Houston, MD;  [facility];  Service: Endoscopy;  Laterality: N/A;  . ESOPHAGEAL  BANDING N/A 05/24/2013   Procedure: ESOPHAGEAL BANDING;  Surgeon: Rogene Houston, MD;  [facility];  Service: Endoscopy;  Laterality: N/A;  . ESOPHAGEAL BANDING N/A 06/21/2014   Procedure: ESOPHAGEAL BANDING;  Surgeon: Rogene Houston, MD;  [facility];  Service: Endoscopy;  Laterality: N/A;  . ESOPHAGOGASTRODUODENOSCOPY N/A 04/01/2013   Procedure: ESOPHAGOGASTRODUODENOSCOPY (EGD);  Surgeon: Rogene Houston, MD;  [facility];  Service: Endoscopy;  Laterality: N/A;  230-rescheduled to 8:30am Ann notified pt  . ESOPHAGOGASTRODUODENOSCOPY N/A 05/24/2013   Procedure: ESOPHAGOGASTRODUODENOSCOPY (EGD);  Surgeon: Rogene Houston, MD;  [facility];  Service: Endoscopy;  Laterality: N/A;  730  . ESOPHAGOGASTRODUODENOSCOPY N/A 06/21/2014   Procedure: ESOPHAGOGASTRODUODENOSCOPY (EGD);  Surgeon: Rogene Houston, MD;  [facility];  Service: Endoscopy;  Laterality: N/A;  930-rescheduled 10/14 @ 1200 Ann to notify pt  . EYE SURGERY  08   cataract surgery of the left eye  . HARDWARE REMOVAL Right 01/17/2013   Procedure: REMOVAL OF HARDWARE AND EXCISION ULNAR STYLOID RIGHT WRIST;  Surgeon: Tennis Must, MD;  Location: [place];  Service: Orthopedics;  Laterality: Right;  . MYRINGOTOMY  2012   both ears  . TONSILLECTOMY    . VAGINAL HYSTERECTOMY  1972  . WRIST SURGERY     rt wrist hardwear removal     reports that she quit smoking about 5 years ago. Her smoking use included Cigarettes. She has a 12.50 pack-year smoking history. She has never used smokeless tobacco. She reports that she does not drink alcohol or use drugs.  Allergies  Allergen Reactions  . Tape Rash    Family History  Problem Relation Age of Onset  . Diabetes Mother   . Heart failure Father   . Heart failure Maternal Aunt      Prior to Admission medications   Medication Sig Start Date End Date Taking? Authorizing Provider  aspirin 81 MG chewable tablet  Chew 243 mg by mouth at bedtime.   Yes [provider]  atorvastatin (LIPITOR) 40 MG tablet Take 40 mg by mouth at bedtime.    Yes [provider]  carvedilol (COREG) 25 MG tablet Take 1 tablet (25 mg total) by mouth 2 (two) times daily. 01/27/17  Yes Lendon Colonel, NP  diltiazem (CARDIZEM CD) 120 MG 24 hr capsule Take 1 capsule (120 mg total) by mouth daily. 01/12/17  Yes Lendon Colonel, NP  ibuprofen (ADVIL) 200 MG tablet Take 200 mg by mouth every 6 (six) hours as needed.   Yes [provider]  isosorbide mononitrate (IMDUR) 30 MG 24 hr tablet Take 30 mg by mouth at bedtime.    Yes [provider]  lisinopril (PRINIVIL,ZESTRIL) 20 MG tablet Take 20 mg by mouth daily.   Yes [provider]  pantoprazole (PROTONIX) 40 MG tablet Take 40 mg by mouth daily.   Yes [provider]  potassium chloride SA (K-DUR,KLOR-CON) 20 MEQ tablet Take 1 tablet (20 mEq total) by mouth daily. 12/09/16  Yes Lendon Colonel,  NP    Physical Exam: Vitals:   02/26/17 1445 02/26/17 1509 02/26/17 1510 02/26/17 1515  BP:  (!) 133/50    Pulse: 80 77 75 77  Resp: 16 20 17 18   Temp:      TempSrc:      SpO2: 97% 99% 100% 99%  Weight:      Height:        Constitutional: not in pain or dyspnea Vitals:   02/26/17 1445 02/26/17 1509 02/26/17 1510 02/26/17 1515  BP:  (!) 133/50    Pulse: 80 77 75 77  Resp: 16 20 17 18   Temp:      TempSrc:      SpO2: 97% 99% 100% 99%  Weight:      Height:       Eyes: PERRL, lids and conjunctivae normal Head normocephalic, nose and ears no deformities.  ENMT: Mucous membranes are moist. Posterior pharynx clear of any exudate or lesions.Normal dentition.  Neck: normal, supple, no masses, no thyromegaly Respiratory: clear to auscultation bilaterally, no wheezing, no crackles. Normal respiratory effort. No accessory muscle use.  Cardiovascular: Regular rate and rhythm, no murmurs / rubs / gallops. No extremity edema. 2+  pedal pulses. No carotid bruits.  Abdomen: no tenderness, no masses palpated. No hepatosplenomegaly. Bowel sounds positive.  Musculoskeletal: no clubbing / cyanosis. No joint deformity upper and lower extremities. Good ROM, no contractures. Normal muscle tone.  Skin: no rashes, lesions, ulcers. No induration Neurologic: CN 2-12 grossly intact. Sensation intact, DTR normal. Strength 5/5 in all 4.    Labs on Admission: I have personally reviewed following labs and imaging studies  CBC:  Recent Labs Lab 02/26/17 1258  WBC 8.5  HGB 11.6*  HCT 35.5*  MCV 85.1  PLT 409   Basic Metabolic Panel:  Recent Labs Lab 02/20/17 1110 02/26/17 1258  NA 136 138  K 3.9 3.2*  CL 106 107  CO2 21* 19*  GLUCOSE 197* 194*  BUN 21* 19  CREATININE 1.09* 0.95  CALCIUM 9.1 9.2  MG 1.9  --    GFR: Estimated Creatinine Clearance: 43 mL/min (by C-G formula based on SCr of 0.95 mg/dL). Liver Function Tests: No results for input(s): AST, ALT, ALKPHOS, BILITOT, PROT, ALBUMIN in the last 168 hours. No results for input(s): LIPASE, AMYLASE in the last 168 hours. No results for input(s): AMMONIA in the last 168 hours. Coagulation Profile:  Recent Labs Lab 02/26/17 1444  INR 1.16   Cardiac Enzymes:  Recent Labs Lab 02/26/17 1258  TROPONINI 0.32*   BNP (last 3 results) No results for input(s): PROBNP in the last 8760 hours. HbA1C: No results for input(s): HGBA1C in the last 72 hours. CBG: No results for input(s): GLUCAP in the last 168 hours. Lipid Profile: No results for input(s): CHOL, HDL, LDLCALC, TRIG, CHOLHDL, LDLDIRECT in the last 72 hours. Thyroid Function Tests: No results for input(s): TSH, T4TOTAL, FREET4, T3FREE, THYROIDAB in the last 72 hours. Anemia Panel: No results for input(s): VITAMINB12, FOLATE, FERRITIN, TIBC, IRON, RETICCTPCT in the last 72 hours. Urine analysis:    Component Value Date/Time   COLORURINE YELLOW 02/08/2016 0205   APPEARANCEUR HAZY (A) 02/08/2016  0205   LABSPEC 1.025 02/08/2016 0205   PHURINE 5.5 02/08/2016 0205   GLUCOSEU NEGATIVE 02/08/2016 0205   HGBUR NEGATIVE 02/08/2016 0205   BILIRUBINUR NEGATIVE 02/08/2016 0205   KETONESUR NEGATIVE 02/08/2016 0205   PROTEINUR TRACE (A) 02/08/2016 0205   UROBILINOGEN 0.2 07/09/2015 0930   NITRITE NEGATIVE 02/08/2016 0205  LEUKOCYTESUR NEGATIVE 02/08/2016 0205    Radiological Exams on Admission: Dg Chest 2 View  Result Date: 02/26/2017 CLINICAL DATA:  CENTER CHEST PAIN SINCE LAST NIGHT, HX OF MI X 2, MOST RECENT 2 YEARS AGO, FORMER SMOKER X 4 YEARS EXAM: CHEST  2 VIEW COMPARISON:  12/07/2016 FINDINGS: Lungs are hyperinflated. Heart size is normal. There are no focal consolidations or pleural effusions. No pulmonary edema. Degenerative changes are seen in thoracic spine. IMPRESSION: No active cardiopulmonary disease. Electronically Signed   By: Nolon Nations M.D.   On: 02/26/2017 12:43    EKG: Independently reviewed. Sinus rhythm, no st elevations or st depression, no significant T wave abnormalities. Poor R-wave progression.  Assessment/Plan Active Problems:   Chest pain   76 year old female with history of ischemic heart disease, last coronary evaluation January 2018 with nonobstructive disease, patient worked up in the outpatient clinic for SVT and symptomatic premature atrial complexes. His son AV blockade, diltiazem and Coreg. Currently chest pain-free. On initial physical examination blood pressure 143/45, heart rate 92, temperature 97.7, rest for 16, oxygen saturation 97%. Lungs clear to auscultation, heart S1-S2 present rhythmic, abdomen soft nontender, no lower extremity edema. Sodium 138, potassium 3.2, chloride 107, bicarbonate 19, glucose 194, BUN 19, creatinine 0.95, white count 8.5, he will 11.6, hematocrit 35.5, platelets 167, troponin 0.32. Chest film with no infiltrates, positive hyperinflation.   Patient will be admitted with a working diagnosis of chest discomfort,  palpitations, elevated troponins, suspected uncontrolled supraventricular tachycardia, rule out acute coronary syndrome.  1. Suspected uncontrolled supraventricular tachycardia. This can explain patient's chest discomfort and elevated troponins, resting heart rate is 72 bpm, will continue diltiazem 120 mg daily, will change Coreg to metoprolol 25 mg twice daily, will recommend to stop heparin drip.   2. Coronary artery disease. Will continue aspirin 81 mg daily, atorvastatin 40 mg daily, isosorbide and lisinopril. No history of recent angina, last cardiac catheterization with no significant obstructive coronary disease, personally reviewed all records. Will continue to check cardiac enzymes.   3. Hypertension. Continue lisinopril, isosorbide and diltiazem, change Coreg to metoprolol.  4. Hypokalemia. Will target a potassium of 4, prescribed 80 mEq of potassium chloride in 2 divided doses, check magnesium, follow-up electrolytes in the morning. Kidney function preserved with a creatinine 0.95.    DVT prophylaxis: heparin  Code Status: Full  Family Communication: No family at the bedside  Disposition Plan: Home  Consults called:  Cardiology  Admission status: Observation    Mauricio Gerome Apley MD [facility] Pager [phone]  If 7PM-7AM, please contact night-coverage [email or web address] Password [facility]  02/26/2017, 3:28 PM

## 2017-02-26 NOTE — ED Triage Notes (Signed)
Chest pain since last night.

## 2017-02-26 NOTE — Progress Notes (Signed)
Central Telemetry called and notified pt is in 1st degree HB. Asymptomatic. MD notified. Will continue to monitor.

## 2017-02-26 NOTE — ED Provider Notes (Signed)
[facility] [place] DEPT Provider Note   CSN: [id number] Arrival date & time: 02/26/17  1211     History   Chief Complaint Chief Complaint  Patient presents with  . Chest Pain    HPI [name] is a 76 y.o. female.  Sx started yesterday.  Comes and goes. Last about a minute at a time.  Similar to prior sx associated with her MI but not as bad.   The history is provided by the patient.  Chest Pain   This is a new problem. The current episode started yesterday. The pain is present in the substernal region. The pain is moderate. The quality of the pain is described as dull. The pain does not radiate. Associated symptoms include nausea. Pertinent negatives include no cough, no diaphoresis, no shortness of breath and no vomiting. She has tried nothing for the symptoms.  Her past medical history is significant for MI.    Past Medical History:  Diagnosis Date  . Anemia   . Aortic insufficiency    Moderate  . Back pain   . Carotid stenosis, bilateral   . Cirrhosis ([place])   . Coronary artery disease    Stent x 2 RCA 1995, cardiac catheterization 09/2016 showing only mild atherosclerosis  . DDD (degenerative disc disease), lumbar   . Essential hypertension   . GERD (gastroesophageal reflux disease)   . Hiatal hernia   . History of stroke   . Hypercholesteremia   . IBS (irritable bowel syndrome)   . Myocardial infarction ([place]) 1995  . Non-alcoholic fatty liver disease   . OSA (obstructive sleep apnea)    Sleep study 2009  AHI 9.91/hr and during REM sleep 35.58/hr  . Pericardial effusion    a. small by echo 2018.  Marland Kitchen PSVT (paroxysmal supraventricular tachycardia) ([place])   . Recurrent UTI   . Type 2 diabetes mellitus ([place])   . Varices, esophageal [facility])     Patient Active Problem List   Diagnosis Date Noted  . Chest pain 12/07/2016  . Atypical chest pain 12/07/2016  . Tachycardia   . NSTEMI (non-ST elevated myocardial infarction) ([place]) 11/02/2016  . Multifocal atrial  tachycardia ([place]) 09/25/2016  . Elevated troponin 09/24/2016  . Hypokalemia 09/23/2016  . TIA (transient ischemic attack) 05/05/2016  . Intractable nausea and vomiting 10/15/2015  . Acute coronary syndrome ([place]) 06/01/2015  . Bronchitis 06/01/2015  . Thrombocytopenia ([place]) 06/01/2015  . CVA (cerebral infarction) 03/06/2014  . UTI (lower urinary tract infection)   . PSVT (paroxysmal supraventricular tachycardia) ([place]) 12/09/2013  . Esophageal varices ([place]) 03/29/2013  . Hematemesis 03/29/2013  . Hepatic cirrhosis ([place]) 03/29/2013  . Presence of stent in right coronary artery 07/04/2011  . OSA (obstructive sleep apnea) 07/04/2011  . Aortic sclerosis 07/04/2011  . Aortic insufficiency 07/04/2011  . HTN (hypertension) 07/04/2011  . Carotid stenosis, bilateral 07/04/2011  . Chest pain at rest 07/03/2011  . CAD (coronary artery disease) 07/03/2011  . DM type 2 (diabetes mellitus, type 2) ([place]) 07/03/2011  . Hypercholesteremia 07/03/2011  . GERD (gastroesophageal reflux disease) 07/03/2011    Past Surgical History:  Procedure Laterality Date  . [place]  . APPENDECTOMY    . BACK SURGERY  1985  . CARDIAC CATHETERIZATION  [phone]   Stent to the proximal RCA after MI   . CARDIAC CATHETERIZATION N/A 09/26/2016   Procedure: Left Heart Cath and Coronary Angiography;  Surgeon: Leonie Man, MD;  Location: [place] [facility];  Service: Cardiovascular;  Laterality: N/A;  .  CATARACT EXTRACTION W/PHACO Right 07/04/2014   Procedure: CATARACT EXTRACTION PHACO AND INTRAOCULAR LENS PLACEMENT (IOC);  Surgeon: Elta Guadeloupe T. Gershon Crane, MD;  Location[facility];  Service: Ophthalmology;  Laterality: Right;  CDE:13.13  . COLONOSCOPY    . DILATION AND CURETTAGE OF UTERUS     x2  . Epi Retinal Membrane Peel Left   . ERCP    . ESOPHAGEAL BANDING N/A 04/01/2013   Procedure: ESOPHAGEAL BANDING;  Surgeon: Rogene Houston, MD;  [facility];  Service: Endoscopy;  Laterality: N/A;    . ESOPHAGEAL BANDING N/A 05/24/2013   Procedure: ESOPHAGEAL BANDING;  Surgeon: Rogene Houston, MD;  [facility];  Service: Endoscopy;  Laterality: N/A;  . ESOPHAGEAL BANDING N/A 06/21/2014   Procedure: ESOPHAGEAL BANDING;  Surgeon: Rogene Houston, MD;  [facility];  Service: Endoscopy;  Laterality: N/A;  . ESOPHAGOGASTRODUODENOSCOPY N/A 04/01/2013   Procedure: ESOPHAGOGASTRODUODENOSCOPY (EGD);  Surgeon: Rogene Houston, MD;  [facility];  Service: Endoscopy;  Laterality: N/A;  230-rescheduled to 8:30am Ann notified pt  . ESOPHAGOGASTRODUODENOSCOPY N/A 05/24/2013   Procedure: ESOPHAGOGASTRODUODENOSCOPY (EGD);  Surgeon: Rogene Houston, MD;  [facility];  Service: Endoscopy;  Laterality: N/A;  730  . ESOPHAGOGASTRODUODENOSCOPY N/A 06/21/2014   Procedure: ESOPHAGOGASTRODUODENOSCOPY (EGD);  Surgeon: Rogene Houston, MD;  [facility];  Service: Endoscopy;  Laterality: N/A;  930-rescheduled 10/14 @ 1200 Ann to notify pt  . EYE SURGERY  08   cataract surgery of the left eye  . HARDWARE REMOVAL Right 01/17/2013   Procedure: REMOVAL OF HARDWARE AND EXCISION ULNAR STYLOID RIGHT WRIST;  Surgeon: Tennis Must, MD;  Location: [place];  Service: Orthopedics;  Laterality: Right;  . MYRINGOTOMY  2012   both ears  . TONSILLECTOMY    . VAGINAL HYSTERECTOMY  1972  . WRIST SURGERY     rt wrist hardwear removal    OB History    No data available       Home Medications    Prior to Admission medications   Medication Sig Start Date End Date Taking? Authorizing Provider  aspirin 81 MG chewable tablet Chew 243 mg by mouth at bedtime.   Yes [provider]  atorvastatin (LIPITOR) 40 MG tablet Take 40 mg by mouth at bedtime.    Yes [provider]  carvedilol (COREG) 25 MG tablet Take 1 tablet (25 mg total) by mouth 2 (two) times daily. 01/27/17  Yes Lendon Colonel, NP  diltiazem (CARDIZEM CD) 120 MG 24 hr  capsule Take 1 capsule (120 mg total) by mouth daily. 01/12/17  Yes Lendon Colonel, NP  ibuprofen (ADVIL) 200 MG tablet Take 200 mg by mouth every 6 (six) hours as needed.   Yes [provider]  isosorbide mononitrate (IMDUR) 30 MG 24 hr tablet Take 30 mg by mouth at bedtime.    Yes [provider]  lisinopril (PRINIVIL,ZESTRIL) 20 MG tablet Take 20 mg by mouth daily.   Yes [provider]  pantoprazole (PROTONIX) 40 MG tablet Take 40 mg by mouth daily.   Yes [provider]  potassium chloride SA (K-DUR,KLOR-CON) 20 MEQ tablet Take 1 tablet (20 mEq total) by mouth daily. 12/09/16  Yes Lendon Colonel, NP    Family History Family History  Problem Relation Age of Onset  . Diabetes Mother   . Heart failure Father   . Heart failure Maternal Aunt     Social History  Social History  Substance Use Topics  . Smoking status: Former Smoker    Packs/day: 0.25    Years: 50.00    Types: Cigarettes    Quit date: 01/13/2012  . Smokeless tobacco: Never Used  . Alcohol use No     Allergies   Tape   Review of Systems Review of Systems  Constitutional: Negative for diaphoresis.  Respiratory: Negative for cough and shortness of breath.   Cardiovascular: Positive for chest pain.  Gastrointestinal: Positive for nausea. Negative for vomiting.     Physical Exam Updated Vital Signs BP (!) 124/53   Pulse 81   Temp 97.7 F (36.5 C) (Temporal)   Resp 13   Ht 1.6 m (5' 3" )   Wt 59 kg (130 lb)   SpO2 97%   BMI 23.03 kg/m   Physical Exam   ED Treatments / Results  Labs (all labs ordered are listed, but only abnormal results are displayed) Labs Reviewed  BASIC METABOLIC PANEL - Abnormal; Notable for the following:       Result Value   Potassium 3.2 (*)    CO2 19 (*)    Glucose, Bld 194 (*)    GFR calc non Af Amer 58 (*)    All other components within normal limits  CBC - Abnormal; Notable for the following:    Hemoglobin 11.6 (*)    HCT  35.5 (*)    All other components within normal limits  TROPONIN I - Abnormal; Notable for the following:    Troponin I 0.32 (*)    All other components within normal limits    EKG  EKG Interpretation  Date/Time:  Thursday February 26 2017 12:13:26 EDT Ventricular Rate:  92 PR Interval:  186 QRS Duration: 88 QT Interval:  392 QTC Calculation: 484 R Axis:   66 Text Interpretation:  Normal sinus rhythm Septal infarct , age undetermined Abnormal ECG No significant change since last tracing Confirmed by Dorie Rank [phone]) on 02/26/2017 12:59:14 PM       Radiology Dg Chest 2 View  Result Date: 02/26/2017 CLINICAL DATA:  CENTER CHEST PAIN SINCE LAST NIGHT, HX OF MI X 2, MOST RECENT 2 YEARS AGO, FORMER SMOKER X 4 YEARS EXAM: CHEST  2 VIEW COMPARISON:  12/07/2016 FINDINGS: Lungs are hyperinflated. Heart size is normal. There are no focal consolidations or pleural effusions. No pulmonary edema. Degenerative changes are seen in thoracic spine. IMPRESSION: No active cardiopulmonary disease. Electronically Signed   By: Nolon Nations M.D.   On: 02/26/2017 12:43    Procedures Procedures (including critical care time)  Medications Ordered in ED Medications  aspirin chewable tablet 324 mg (324 mg Oral Given 02/26/17 1329)  nitroGLYCERIN (NITROGLYN) 2 % ointment 0.5 inch (0.5 inches Topical Given 02/26/17 1333)     Initial Impression / Assessment and Plan / ED Course  I have reviewed the triage vital signs and the nursing notes.  Pertinent labs & imaging results that were available during my care of the patient were reviewed by me and considered in my medical decision making (see chart for details).  Clinical Course as of Feb 27 1423  Thu Feb 26, 2017  1418 Troponin is elevated.  Similar to previous result 2 months ago.  ?chronic elevation  [JK]  1420 Previous records reviewed.   Cardiac cath in Jan 2018.  Non obstructive disease.  [JK]    Clinical Course User Index [JK] Dorie Rank, MD     Patient presents to the emergency room  with chest pain that she feels is similar to her prior anginal symptoms. According to the medical records sounds like the patient had a similar episode in January of this year. She had a cardiac catheterization that showed no acute obstructions to account for her chest pain. Patient does have an elevated troponin today. This is similar to her previous levels. However considering her symptoms I'm concerned about the possibility non-ST elevation MI. Currently she is pain-free. I will order heparin in addition to the aspirin and nitroglycerin. I will consult with cardiology in the medical service to discuss admission  Final Clinical Impressions(s) / ED Diagnoses   Final diagnoses:  Chest pain, unspecified type  Elevated troponin      Dorie Rank, MD 02/26/17 1424

## 2017-02-26 NOTE — Plan of Care (Signed)
Problem: Safety: Goal: Ability to remain free from injury will improve Outcome: Adequate for Discharge Pt moderate fall risk d/t BP medications. Pt up ad lib in room with steady gait. Educated on safety precautions and adv to call if dizzy or feeling chest pain. Pt verbalized understanding. Will continue to monitor pt

## 2017-02-27 ENCOUNTER — Other Ambulatory Visit: Payer: Self-pay

## 2017-02-27 DIAGNOSIS — R000 Tachycardia, unspecified: Secondary | ICD-10-CM

## 2017-02-27 DIAGNOSIS — R748 Abnormal levels of other serum enzymes: Secondary | ICD-10-CM | POA: Diagnosis not present

## 2017-02-27 DIAGNOSIS — I471 Supraventricular tachycardia: Secondary | ICD-10-CM | POA: Diagnosis not present

## 2017-02-27 DIAGNOSIS — R079 Chest pain, unspecified: Secondary | ICD-10-CM | POA: Diagnosis not present

## 2017-02-27 LAB — GLUCOSE, CAPILLARY
GLUCOSE-CAPILLARY: 147 mg/dL — AB (ref 65–99)
Glucose-Capillary: 110 mg/dL — ABNORMAL HIGH (ref 65–99)

## 2017-02-27 LAB — BASIC METABOLIC PANEL
ANION GAP: 8 (ref 5–15)
BUN: 12 mg/dL (ref 6–20)
CO2: 19 mmol/L — ABNORMAL LOW (ref 22–32)
Calcium: 8.8 mg/dL — ABNORMAL LOW (ref 8.9–10.3)
Chloride: 109 mmol/L (ref 101–111)
Creatinine, Ser: 0.72 mg/dL (ref 0.44–1.00)
GFR calc Af Amer: >60 mL/min (ref >60)
GLUCOSE: 107 mg/dL — AB (ref 65–99)
POTASSIUM: 3.9 mmol/L (ref 3.5–5.1)
Sodium: 136 mmol/L (ref 135–145)

## 2017-02-27 LAB — TROPONIN I: TROPONIN I: 0.38 ng/mL — AB (ref <0.03)

## 2017-02-27 LAB — MAGNESIUM: MAGNESIUM: 1.4 mg/dL — AB (ref 1.7–2.4)

## 2017-02-27 MED ORDER — DILTIAZEM HCL ER COATED BEADS 180 MG PO CP24: 180.0000 mg | ORAL_CAPSULE | Freq: Every day | ORAL | 0 refills | Status: DC

## 2017-02-27 MED ORDER — METOPROLOL TARTRATE 25 MG PO TABS: 12.5000 mg | ORAL_TABLET | Freq: Two times a day (BID) | ORAL | Status: DC

## 2017-02-27 MED ORDER — METOPROLOL TARTRATE 25 MG PO TABS: 12.5000 mg | ORAL_TABLET | Freq: Once | ORAL | Status: DC

## 2017-02-27 MED ORDER — MAGNESIUM SULFATE 2 GM/50ML IV SOLN
2.0000 g | Freq: Once | INTRAVENOUS | Status: AC
  Administered 2017-02-27: 2 g via INTRAVENOUS
  Filled 2017-02-27: qty 50

## 2017-02-27 MED ORDER — METOPROLOL TARTRATE 25 MG PO TABS: 25.0000 mg | ORAL_TABLET | Freq: Two times a day (BID) | ORAL | Status: DC

## 2017-02-27 NOTE — Progress Notes (Signed)
Pt's IV catheter removed and intact. Pt's IV site clean dry and intact. Discharge instructions including medications and follow up appointments were reviewed and discussed with patient. Pt verbalized understanding of discharge instructions. All questions were answered and no further questions at this time. Pt in stable condition and in no acute distress at time. Pt will be escorted by nurse tech.

## 2017-02-27 NOTE — Progress Notes (Signed)
CRITICAL VALUE ALERT  Critical Value: Troponin 0.38  Date & Time Notied:  02/27/2017 @ 05:29am  Provider Notified: Dr. Marin Comment  Orders Received/Actions taken: Currently waiting for orders

## 2017-02-27 NOTE — Discharge Summary (Signed)
Physician Discharge Summary  [name] PYK:[id number] DOB: [birth year] DOA: 02/26/2017  PCP: Redmond School, MD  Admit date: 02/26/2017 Discharge date: 02/27/2017  Admitted From:  Home  Disposition:  Home    Recommendations for Outpatient Follow-up:  1. Follow up with PCP in 1- weeks 2. Coreg will be continued at 25 mg po bid 3. Diltiazem has been increased to 180 mg daily  Home Health: No  Equipment/Devices: No   Discharge Condition: Stable  CODE STATUS: Full  Diet recommendation: Heart Healthy    Brief/Interim Summary: 76 year old female with history of ischemic heart disease, last coronary evaluation January 2018 with nonobstructive disease, patient worked up in the outpatient clinic for SVT and symptomatic premature atrial complexes. She is on AV blockade, diltiazem and Coreg. Currently chest pain-free. On initial physical examination blood pressure 143/45, heart rate 92, temperature 97.7, respiratory rate 16, oxygen saturation 97%. Lungs clear to auscultation, heart S1-S2 present rhythmic, abdomen soft nontender, no lower extremity edema. Sodium 138, potassium 3.2, chloride 107, bicarbonate 19, glucose 194, BUN 19, creatinine 0.95, white count 8.5, hemoglobin 11.6, hematocrit 35.5, platelets 167, troponin 0.32. Chest film with no infiltrates, positive hyperinflation. EKG with sinus rhythm, no st elevations or st depression, no significant T wave abnormalities. Poor R-wave progression.  Patient  admitted with a working diagnosis of chest discomfort, palpitations, elevated troponins, suspected uncontrolled supraventricular tachycardia, rule out acute coronary syndrome.  1. Atypical chest pain, rule out for acute coronary syndrome. Patient had serial cardiac enzymes, with nonischemic pattern, no further chest discomfort. Patient ruled out for acute coronary syndrome. Patient will continue antiplatelet therapy with aspirin, statin with atorvastatin, nitrates with isosorbide. Continue  beta blockade and ACE inhibitor.   2. Supraventricular tachycardia. Patient remained sinus rhythm on telemetry monitor, resting heart rate up to 100. Diltiazem has been increased to 180 mg daily and carvedilol will be continued at 25 mg twice daily. May need outpatient event recorder.   3. Hypertension. Blood pressure remained well-controlled on lisinopril, isosorbide, coreg and diltiazem.  4. Hypokalemia and hypomagnesemia. Potassium was corrected with potassium chloride, discharge potassium is 3.4, serum magnesium was 1.4, she received 2 g of magnesium sulfate intravenously before discharge. Continue supplemental potassium chloride 20 mEq daily.   Discharge Diagnoses:  Active Problems:   Chest pain    Discharge Instructions   Allergies as of 02/27/2017      Reactions   Tape Rash      Medication List    TAKE these medications   ADVIL 200 MG tablet Generic drug:  ibuprofen Take 200 mg by mouth every 6 (six) hours as needed.   aspirin 81 MG chewable tablet Chew 243 mg by mouth at bedtime.   atorvastatin 40 MG tablet Commonly known as:  LIPITOR Take 40 mg by mouth at bedtime.   carvedilol 25 MG tablet Commonly known as:  COREG Take 1 tablet (25 mg total) by mouth 2 (two) times daily.   diltiazem 180 MG 24 hr capsule Commonly known as:  CARDIZEM CD Take 1 capsule (180 mg total) by mouth daily. Start taking on:  02/28/2017 What changed:  medication strength  how much to take   isosorbide mononitrate 30 MG 24 hr tablet Commonly known as:  IMDUR Take 30 mg by mouth at bedtime.   lisinopril 20 MG tablet Commonly known as:  PRINIVIL,ZESTRIL Take 20 mg by mouth daily.   pantoprazole 40 MG tablet Commonly known as:  PROTONIX Take 40 mg by mouth daily.   potassium chloride SA  20 MEQ tablet Commonly known as:  K-DUR,KLOR-CON Take 1 tablet (20 mEq total) by mouth daily.       Allergies  Allergen Reactions  . Tape Rash    Consultations:  Cardiology     Procedures/Studies: Dg Chest 2 View  Result Date: 02/26/2017 CLINICAL DATA:  CENTER CHEST PAIN SINCE LAST NIGHT, HX OF MI X 2, MOST RECENT 2 YEARS AGO, FORMER SMOKER X 4 YEARS EXAM: CHEST  2 VIEW COMPARISON:  12/07/2016 FINDINGS: Lungs are hyperinflated. Heart size is normal. There are no focal consolidations or pleural effusions. No pulmonary edema. Degenerative changes are seen in thoracic spine. IMPRESSION: No active cardiopulmonary disease. Electronically Signed   By: Nolon Nations M.D.   On: 02/26/2017 12:43      Subjective: Patient feeling better, no further chest discomfort or palpitations, no nausea or vomiting.   Discharge Exam: Vitals:   02/27/17 0616 02/27/17 0941  BP: (!) 154/62 (!) 151/45  Pulse: 100   Resp: 18   Temp: 98.5 F (36.9 C)    Vitals:   02/26/17 1955 02/26/17 2220 02/27/17 0616 02/27/17 0941  BP:  (!) 138/45 (!) 154/62 (!) 151/45  Pulse:  84 100   Resp:  18 18   Temp:  98.9 F (37.2 C) 98.5 F (36.9 C)   TempSrc:  Oral Oral   SpO2: 97% 99% 97%   Weight:      Height:        General: Pt is alert, awake, not in acute distress Mild pallor, no icterus, oral mucosa moist.  Cardiovascular: RRR, S1/S2 +, no rubs, no gallops Respiratory: CTA bilaterally, no wheezing, no rhonchi Abdominal: Soft, NT, ND, bowel sounds + Extremities: no edema, no cyanosis    The results of significant diagnostics from this hospitalization (including imaging, microbiology, ancillary and laboratory) are listed below for reference.     Microbiology: No results found for this or any previous visit (from the past 240 hour(s)).   Labs: BNP (last 3 results) No results for input(s): BNP in the last 8760 hours. Basic Metabolic Panel:  Recent Labs Lab 02/20/17 1110 02/26/17 1258 02/27/17 0357  NA 136 138 136  K 3.9 3.2* 3.9  CL 106 107 109  CO2 21* 19* 19*  GLUCOSE 197* 194* 107*  BUN 21* 19 12  CREATININE 1.09* 0.95 0.72  CALCIUM 9.1 9.2 8.8*  MG 1.9  --   1.4*   Liver Function Tests: No results for input(s): AST, ALT, ALKPHOS, BILITOT, PROT, ALBUMIN in the last 168 hours. No results for input(s): LIPASE, AMYLASE in the last 168 hours. No results for input(s): AMMONIA in the last 168 hours. CBC:  Recent Labs Lab 02/26/17 1258  WBC 8.5  HGB 11.6*  HCT 35.5*  MCV 85.1  PLT 167   Cardiac Enzymes:  Recent Labs Lab 02/26/17 1258 02/26/17 1610 02/26/17 2150 02/27/17 0357  TROPONINI 0.32* 0.35* 0.35* 0.38*   BNP: Invalid input(s): POCBNP CBG:  Recent Labs Lab 02/26/17 2233 02/27/17 0733  GLUCAP 163* 110*   D-Dimer No results for input(s): DDIMER in the last 72 hours. Hgb A1c No results for input(s): HGBA1C in the last 72 hours. Lipid Profile No results for input(s): CHOL, HDL, LDLCALC, TRIG, CHOLHDL, LDLDIRECT in the last 72 hours. Thyroid function studies No results for input(s): TSH, T4TOTAL, T3FREE, THYROIDAB in the last 72 hours.  Invalid input(s): FREET3 Anemia work up No results for input(s): VITAMINB12, FOLATE, FERRITIN, TIBC, IRON, RETICCTPCT in the last 72 hours. Urinalysis  Component Value Date/Time   COLORURINE YELLOW 02/08/2016 0205   APPEARANCEUR HAZY (A) 02/08/2016 0205   LABSPEC 1.025 02/08/2016 0205   PHURINE 5.5 02/08/2016 0205   GLUCOSEU NEGATIVE 02/08/2016 0205   HGBUR NEGATIVE 02/08/2016 0205   BILIRUBINUR NEGATIVE 02/08/2016 0205   KETONESUR NEGATIVE 02/08/2016 0205   PROTEINUR TRACE (A) 02/08/2016 0205   UROBILINOGEN 0.2 07/09/2015 0930   NITRITE NEGATIVE 02/08/2016 0205   LEUKOCYTESUR NEGATIVE 02/08/2016 0205   Sepsis Labs Invalid input(s): PROCALCITONIN,  WBC,  LACTICIDVEN Microbiology No results found for this or any previous visit (from the past 240 hour(s)).   Time coordinating discharge: 45 minutes  SIGNED:   Tawni Millers, MD  [facility] 02/27/2017, 9:51 AM Pager   If 7PM-7AM, please contact night-coverage [email or web address] Password [email or web address]

## 2017-02-27 NOTE — Progress Notes (Signed)
Progress Note  Patient Name: [name] Date of Encounter: 02/27/2017    Subjective   No complaints this AM  Inpatient Medications    Scheduled Meds: . aspirin  243 mg Oral QHS  . atorvastatin  40 mg Oral QHS  . diltiazem  180 mg Oral Daily  . isosorbide mononitrate  30 mg Oral QHS  . lisinopril  20 mg Oral Daily  . metoprolol tartrate  12.5 mg Oral BID  . pantoprazole  40 mg Oral Daily  . sodium chloride flush  3 mL Intravenous Q12H  . sodium chloride flush  3 mL Intravenous Q12H   Continuous Infusions: . sodium chloride    . magnesium sulfate 1 - 4 g bolus IVPB 2 g (02/27/17 0938)   PRN Meds: sodium chloride, acetaminophen **OR** acetaminophen, ibuprofen, ondansetron **OR** ondansetron (ZOFRAN) IV, sodium chloride flush   Vital Signs    Vitals:   02/26/17 1955 02/26/17 2220 02/27/17 0616 02/27/17 0941  BP:  (!) 138/45 (!) 154/62 (!) 151/45  Pulse:  84 100   Resp:  18 18   Temp:  98.9 F (37.2 C) 98.5 F (36.9 C)   TempSrc:  Oral Oral   SpO2: 97% 99% 97%   Weight:      Height:        Intake/Output Summary (Last 24 hours) at 02/27/17 1028 Last data filed at 02/27/17 0945  Gross per 24 hour  Intake           548.95 ml  Output                0 ml  Net           548.95 ml   Filed Weights   02/26/17 1214 02/26/17 1618  Weight: 130 lb (59 kg) 130 lb (59 kg)    Telemetry    SR and sinus tach - Personally Reviewed  ECG    Physical Exam   GEN: No acute distress.   Neck: No JVD Cardiac: ZOX,0/9 systolic murmur at apex Respiratory: Clear to auscultation bilaterally. GI: Soft, nontender, non-distended  MS: No edema; No deformity. Neuro:  Nonfocal  Psych: Normal affect   Labs    Chemistry Recent Labs Lab 02/20/17 1110 02/26/17 1258 02/27/17 0357  NA 136 138 136  K 3.9 3.2* 3.9  CL 106 107 109  CO2 21* 19* 19*  GLUCOSE 197* 194* 107*  BUN 21* 19 12  CREATININE 1.09* 0.95 0.72  CALCIUM 9.1 9.2 8.8*  GFRNONAA 49* 58* >60  GFRAA 57*  >60 >60  ANIONGAP 9 12 8      Hematology Recent Labs Lab 02/26/17 1258  WBC 8.5  RBC 4.17  HGB 11.6*  HCT 35.5*  MCV 85.1  MCH 27.8  MCHC 32.7  RDW 14.9  PLT 167    Cardiac Enzymes Recent Labs Lab 02/26/17 1258 02/26/17 1610 02/26/17 2150 02/27/17 0357  TROPONINI 0.32* 0.35* 0.35* 0.38*   No results for input(s): TROPIPOC in the last 168 hours.   BNPNo results for input(s): BNP, PROBNP in the last 168 hours.   DDimer No results for input(s): DDIMER in the last 168 hours.   Radiology    Dg Chest 2 View  Result Date: 02/26/2017 CLINICAL DATA:  CENTER CHEST PAIN SINCE LAST NIGHT, HX OF MI X 2, MOST RECENT 2 YEARS AGO, FORMER SMOKER X 4 YEARS EXAM: CHEST  2 VIEW COMPARISON:  12/07/2016 FINDINGS: Lungs are hyperinflated. Heart size is normal. There are no focal consolidations  or pleural effusions. No pulmonary edema. Degenerative changes are seen in thoracic spine. IMPRESSION: No active cardiopulmonary disease. Electronically Signed   By: Nolon Nations M.D.   On: 02/26/2017 12:43    Cardiac Studies    Patient Profile     76 y.o. female with history of PSVT, CAD with stents to RCA x 2 in 1995 and most recent cath Jan 2018 with patent coronaries, cryptogenic cirrhosis. She has had multiple admissions with SVT and troponin elevation.Admit 10/2016 trop to 1.33 in setting of SVT, cath at that time nonobstructive disease   Assessment & Plan  1. PSVT/Palpitations - admitted with symptomatic palpitations. She has prior history of PSVT and sinus tachycardia - dilt increased to 19m daily. Remains on coreg 237mbid. - follow symptoms, if peristent may warrant home monitor, possible outpatient EP evaluation - telemetry here shows SR and sinus tach   2. CAD - multiple prior admissions with elevated troponin, typically in setting of tachycardia - recent cath with nonobstructive disease - prior CT PE negative - continue medical therapy.   3. Hypomagnesemia - per primary  team   [facility]. We will arrange outpt follow up in 2 weeks.   SiMerrily PewMD  02/27/2017, 10:28 AM

## 2017-02-28 ENCOUNTER — Other Ambulatory Visit: Payer: Self-pay

## 2017-02-28 ENCOUNTER — Emergency Department (HOSPITAL_COMMUNITY): Payer: Medicare Other

## 2017-02-28 ENCOUNTER — Encounter (HOSPITAL_COMMUNITY): Payer: Self-pay | Admitting: Emergency Medicine

## 2017-02-28 ENCOUNTER — Inpatient Hospital Stay (HOSPITAL_COMMUNITY)
Admission: EM | Admit: 2017-02-28 | Discharge: 2017-03-05 | DRG: 981 | Disposition: A | Discharge status: Skilled Nursing Facility | Payer: Medicare Other | Attending: Internal Medicine | Admitting: Internal Medicine

## 2017-02-28 ENCOUNTER — Inpatient Hospital Stay (HOSPITAL_COMMUNITY): Payer: Medicare Other

## 2017-02-28 DIAGNOSIS — D696 Thrombocytopenia, unspecified: Secondary | ICD-10-CM | POA: Diagnosis present

## 2017-02-28 DIAGNOSIS — S72145D Nondisplaced intertrochanteric fracture of left femur, subsequent encounter for closed fracture with routine healing: Secondary | ICD-10-CM | POA: Diagnosis not present

## 2017-02-28 DIAGNOSIS — I471 Supraventricular tachycardia, unspecified: Secondary | ICD-10-CM | POA: Diagnosis present

## 2017-02-28 DIAGNOSIS — E7800 Pure hypercholesterolemia, unspecified: Secondary | ICD-10-CM | POA: Diagnosis present

## 2017-02-28 DIAGNOSIS — I252 Old myocardial infarction: Secondary | ICD-10-CM | POA: Diagnosis not present

## 2017-02-28 DIAGNOSIS — Z955 Presence of coronary angioplasty implant and graft: Secondary | ICD-10-CM | POA: Diagnosis not present

## 2017-02-28 DIAGNOSIS — R569 Unspecified convulsions: Secondary | ICD-10-CM

## 2017-02-28 DIAGNOSIS — I352 Nonrheumatic aortic (valve) stenosis with insufficiency: Secondary | ICD-10-CM | POA: Diagnosis present

## 2017-02-28 DIAGNOSIS — Z79899 Other long term (current) drug therapy: Secondary | ICD-10-CM | POA: Diagnosis not present

## 2017-02-28 DIAGNOSIS — Z8673 Personal history of transient ischemic attack (TIA), and cerebral infarction without residual deficits: Secondary | ICD-10-CM

## 2017-02-28 DIAGNOSIS — R600 Localized edema: Secondary | ICD-10-CM | POA: Diagnosis not present

## 2017-02-28 DIAGNOSIS — R7989 Other specified abnormal findings of blood chemistry: Secondary | ICD-10-CM | POA: Diagnosis present

## 2017-02-28 DIAGNOSIS — Z419 Encounter for procedure for purposes other than remedying health state, unspecified: Secondary | ICD-10-CM

## 2017-02-28 DIAGNOSIS — I10 Essential (primary) hypertension: Secondary | ICD-10-CM | POA: Diagnosis not present

## 2017-02-28 DIAGNOSIS — Z87891 Personal history of nicotine dependence: Secondary | ICD-10-CM

## 2017-02-28 DIAGNOSIS — G40901 Epilepsy, unspecified, not intractable, with status epilepticus: Principal | ICD-10-CM | POA: Diagnosis present

## 2017-02-28 DIAGNOSIS — E785 Hyperlipidemia, unspecified: Secondary | ICD-10-CM | POA: Diagnosis present

## 2017-02-28 DIAGNOSIS — S72002A Fracture of unspecified part of neck of left femur, initial encounter for closed fracture: Secondary | ICD-10-CM | POA: Diagnosis not present

## 2017-02-28 DIAGNOSIS — I342 Nonrheumatic mitral (valve) stenosis: Secondary | ICD-10-CM | POA: Diagnosis not present

## 2017-02-28 DIAGNOSIS — K7460 Unspecified cirrhosis of liver: Secondary | ICD-10-CM | POA: Diagnosis not present

## 2017-02-28 DIAGNOSIS — Z7982 Long term (current) use of aspirin: Secondary | ICD-10-CM | POA: Diagnosis not present

## 2017-02-28 DIAGNOSIS — G4733 Obstructive sleep apnea (adult) (pediatric): Secondary | ICD-10-CM | POA: Diagnosis not present

## 2017-02-28 DIAGNOSIS — S72102A Unspecified trochanteric fracture of left femur, initial encounter for closed fracture: Secondary | ICD-10-CM | POA: Diagnosis present

## 2017-02-28 DIAGNOSIS — J9811 Atelectasis: Secondary | ICD-10-CM | POA: Diagnosis not present

## 2017-02-28 DIAGNOSIS — S72009A Fracture of unspecified part of neck of unspecified femur, initial encounter for closed fracture: Secondary | ICD-10-CM | POA: Diagnosis not present

## 2017-02-28 DIAGNOSIS — Z09 Encounter for follow-up examination after completed treatment for conditions other than malignant neoplasm: Secondary | ICD-10-CM

## 2017-02-28 DIAGNOSIS — E876 Hypokalemia: Secondary | ICD-10-CM | POA: Diagnosis not present

## 2017-02-28 DIAGNOSIS — S72142A Displaced intertrochanteric fracture of left femur, initial encounter for closed fracture: Secondary | ICD-10-CM | POA: Diagnosis not present

## 2017-02-28 DIAGNOSIS — I2510 Atherosclerotic heart disease of native coronary artery without angina pectoris: Secondary | ICD-10-CM | POA: Diagnosis present

## 2017-02-28 DIAGNOSIS — I6621 Occlusion and stenosis of right posterior cerebral artery: Secondary | ICD-10-CM | POA: Diagnosis present

## 2017-02-28 DIAGNOSIS — K760 Fatty (change of) liver, not elsewhere classified: Secondary | ICD-10-CM | POA: Diagnosis not present

## 2017-02-28 DIAGNOSIS — Z7984 Long term (current) use of oral hypoglycemic drugs: Secondary | ICD-10-CM | POA: Diagnosis not present

## 2017-02-28 DIAGNOSIS — W19XXXA Unspecified fall, initial encounter: Secondary | ICD-10-CM | POA: Diagnosis not present

## 2017-02-28 DIAGNOSIS — Z9181 History of falling: Secondary | ICD-10-CM | POA: Diagnosis not present

## 2017-02-28 DIAGNOSIS — K219 Gastro-esophageal reflux disease without esophagitis: Secondary | ICD-10-CM | POA: Diagnosis not present

## 2017-02-28 DIAGNOSIS — I6522 Occlusion and stenosis of left carotid artery: Secondary | ICD-10-CM | POA: Diagnosis present

## 2017-02-28 DIAGNOSIS — G9340 Encephalopathy, unspecified: Secondary | ICD-10-CM | POA: Diagnosis not present

## 2017-02-28 DIAGNOSIS — I639 Cerebral infarction, unspecified: Secondary | ICD-10-CM | POA: Diagnosis present

## 2017-02-28 DIAGNOSIS — Z471 Aftercare following joint replacement surgery: Secondary | ICD-10-CM | POA: Diagnosis not present

## 2017-02-28 DIAGNOSIS — D62 Acute posthemorrhagic anemia: Secondary | ICD-10-CM | POA: Diagnosis not present

## 2017-02-28 DIAGNOSIS — R404 Transient alteration of awareness: Secondary | ICD-10-CM | POA: Diagnosis not present

## 2017-02-28 DIAGNOSIS — E119 Type 2 diabetes mellitus without complications: Secondary | ICD-10-CM | POA: Diagnosis not present

## 2017-02-28 DIAGNOSIS — R061 Stridor: Secondary | ICD-10-CM | POA: Diagnosis not present

## 2017-02-28 DIAGNOSIS — R778 Other specified abnormalities of plasma proteins: Secondary | ICD-10-CM | POA: Diagnosis present

## 2017-02-28 DIAGNOSIS — M6281 Muscle weakness (generalized): Secondary | ICD-10-CM | POA: Diagnosis not present

## 2017-02-28 DIAGNOSIS — I6523 Occlusion and stenosis of bilateral carotid arteries: Secondary | ICD-10-CM | POA: Diagnosis not present

## 2017-02-28 DIAGNOSIS — I672 Cerebral atherosclerosis: Secondary | ICD-10-CM | POA: Diagnosis not present

## 2017-02-28 LAB — COMPREHENSIVE METABOLIC PANEL
ALBUMIN: 4 g/dL (ref 3.5–5.0)
ALK PHOS: 76 U/L (ref 38–126)
ALT: 20 U/L (ref 14–54)
ANION GAP: 12 (ref 5–15)
AST: 32 U/L (ref 15–41)
BILIRUBIN TOTAL: 0.4 mg/dL (ref 0.3–1.2)
BUN: 15 mg/dL (ref 6–20)
CO2: 21 mmol/L — ABNORMAL LOW (ref 22–32)
Calcium: 9.2 mg/dL (ref 8.9–10.3)
Chloride: 106 mmol/L (ref 101–111)
Creatinine, Ser: 1.3 mg/dL — ABNORMAL HIGH (ref 0.44–1.00)
GFR calc Af Amer: 46 mL/min — ABNORMAL LOW (ref >60)
GFR, EST NON AFRICAN AMERICAN: 39 mL/min — AB (ref >60)
GLUCOSE: 208 mg/dL — AB (ref 65–99)
POTASSIUM: 4.1 mmol/L (ref 3.5–5.1)
Sodium: 139 mmol/L (ref 135–145)
TOTAL PROTEIN: 7.1 g/dL (ref 6.5–8.1)

## 2017-02-28 LAB — CBC WITH DIFFERENTIAL/PLATELET
Basophils Absolute: 0 10*3/uL (ref 0.0–0.1)
Basophils Relative: 0 %
EOS PCT: 1 %
Eosinophils Absolute: 0.1 10*3/uL (ref 0.0–0.7)
HCT: 35.7 % — ABNORMAL LOW (ref 36.0–46.0)
Hemoglobin: 11.8 g/dL — ABNORMAL LOW (ref 12.0–15.0)
LYMPHS PCT: 23 %
Lymphs Abs: 2.6 10*3/uL (ref 0.7–4.0)
MCH: 28.3 pg (ref 26.0–34.0)
MCHC: 33.1 g/dL (ref 30.0–36.0)
MCV: 85.6 fL (ref 78.0–100.0)
MONO ABS: 0.7 10*3/uL (ref 0.1–1.0)
Monocytes Relative: 7 %
NEUTROS ABS: 7.6 10*3/uL (ref 1.7–7.7)
Neutrophils Relative %: 69 %
PLATELETS: 231 10*3/uL (ref 150–400)
RBC: 4.17 MIL/uL (ref 3.87–5.11)
RDW: 15.4 % (ref 11.5–15.5)
WBC: 11 10*3/uL — AB (ref 4.0–10.5)

## 2017-02-28 LAB — FOLATE: FOLATE: 5.3 ng/mL — AB (ref >5.9)

## 2017-02-28 LAB — CBG MONITORING, ED: Glucose-Capillary: 175 mg/dL — ABNORMAL HIGH (ref 65–99)

## 2017-02-28 LAB — T4, FREE: FREE T4: 1 ng/dL (ref 0.61–1.12)

## 2017-02-28 LAB — LACTIC ACID, PLASMA: Lactic Acid, Venous: 1.1 mmol/L (ref 0.5–1.9)

## 2017-02-28 LAB — AMMONIA: Ammonia: 15 umol/L (ref 9–35)

## 2017-02-28 LAB — MAGNESIUM: MAGNESIUM: 2 mg/dL (ref 1.7–2.4)

## 2017-02-28 LAB — TSH: TSH: 0.667 u[IU]/mL (ref 0.350–4.500)

## 2017-02-28 LAB — VITAMIN B12: VITAMIN B 12: 358 pg/mL (ref 180–914)

## 2017-02-28 MED ORDER — LORAZEPAM 2 MG/ML IJ SOLN
INTRAMUSCULAR | Status: AC
  Filled 2017-02-28: qty 1

## 2017-02-28 MED ORDER — ONDANSETRON HCL 4 MG/2ML IJ SOLN
4.0000 mg | Freq: Four times a day (QID) | INTRAMUSCULAR | Status: DC | PRN
  Administered 2017-03-02: 4 mg via INTRAVENOUS
  Filled 2017-02-28: qty 2

## 2017-02-28 MED ORDER — ATORVASTATIN CALCIUM 40 MG PO TABS
40.0000 mg | ORAL_TABLET | Freq: Every day | ORAL | Status: DC
  Administered 2017-02-28 – 2017-03-04 (×5): 40 mg via ORAL
  Filled 2017-02-28 (×5): qty 1

## 2017-02-28 MED ORDER — PHENYTOIN SODIUM 50 MG/ML IJ SOLN
100.0000 mg | Freq: Three times a day (TID) | INTRAMUSCULAR | Status: DC
  Administered 2017-02-28 – 2017-03-04 (×12): 100 mg via INTRAVENOUS
  Filled 2017-02-28 (×12): qty 2

## 2017-02-28 MED ORDER — LORAZEPAM 2 MG/ML IJ SOLN
2.0000 mg | Freq: Once | INTRAMUSCULAR | Status: AC
  Administered 2017-02-28: 2 mg via INTRAVENOUS

## 2017-02-28 MED ORDER — PANTOPRAZOLE SODIUM 40 MG PO TBEC
40.0000 mg | DELAYED_RELEASE_TABLET | Freq: Every day | ORAL | Status: DC
  Administered 2017-02-28 – 2017-03-05 (×6): 40 mg via ORAL
  Filled 2017-02-28 (×6): qty 1

## 2017-02-28 MED ORDER — DILTIAZEM HCL ER COATED BEADS 180 MG PO CP24
180.0000 mg | ORAL_CAPSULE | Freq: Every day | ORAL | Status: DC
  Administered 2017-02-28 – 2017-03-01 (×2): 180 mg via ORAL
  Filled 2017-02-28 (×3): qty 1

## 2017-02-28 MED ORDER — SODIUM CHLORIDE 0.9 % IV SOLN
1000.0000 mg | Freq: Once | INTRAVENOUS | Status: AC
  Administered 2017-02-28: 1000 mg via INTRAVENOUS
  Filled 2017-02-28: qty 20

## 2017-02-28 MED ORDER — ISOSORBIDE MONONITRATE ER 30 MG PO TB24
30.0000 mg | ORAL_TABLET | Freq: Every day | ORAL | Status: DC
  Administered 2017-02-28 – 2017-03-02 (×3): 30 mg via ORAL
  Filled 2017-02-28 (×3): qty 1

## 2017-02-28 MED ORDER — CYANOCOBALAMIN 1000 MCG/ML IJ SOLN
1000.0000 ug | Freq: Once | INTRAMUSCULAR | Status: AC
  Administered 2017-02-28: 1000 ug via SUBCUTANEOUS
  Filled 2017-02-28: qty 1

## 2017-02-28 MED ORDER — HEPARIN SODIUM (PORCINE) 5000 UNIT/ML IJ SOLN
5000.0000 [IU] | Freq: Three times a day (TID) | INTRAMUSCULAR | Status: DC
  Administered 2017-02-28 – 2017-03-01 (×4): 5000 [IU] via SUBCUTANEOUS
  Filled 2017-02-28 (×4): qty 1

## 2017-02-28 MED ORDER — CARVEDILOL 25 MG PO TABS
25.0000 mg | ORAL_TABLET | Freq: Two times a day (BID) | ORAL | Status: DC
  Administered 2017-02-28 – 2017-03-03 (×4): 25 mg via ORAL
  Filled 2017-02-28 (×5): qty 1

## 2017-02-28 MED ORDER — LORAZEPAM 2 MG/ML IJ SOLN
1.0000 mg | Freq: Once | INTRAMUSCULAR | Status: AC
  Administered 2017-02-28: 1 mg via INTRAVENOUS

## 2017-02-28 MED ORDER — FOLIC ACID 1 MG PO TABS
1.0000 mg | ORAL_TABLET | Freq: Every day | ORAL | Status: DC
  Administered 2017-02-28 – 2017-03-05 (×6): 1 mg via ORAL
  Filled 2017-02-28 (×6): qty 1

## 2017-02-28 MED ORDER — PHENYTOIN SODIUM 50 MG/ML IJ SOLN
INTRAMUSCULAR | Status: AC
  Filled 2017-02-28: qty 20

## 2017-02-28 MED ORDER — ASPIRIN 81 MG PO CHEW
243.0000 mg | CHEWABLE_TABLET | Freq: Every day | ORAL | Status: DC
  Administered 2017-02-28: 243 mg via ORAL
  Filled 2017-02-28: qty 3

## 2017-02-28 MED ORDER — ONDANSETRON HCL 4 MG PO TABS
4.0000 mg | ORAL_TABLET | Freq: Four times a day (QID) | ORAL | Status: DC | PRN
  Administered 2017-03-05: 4 mg via ORAL
  Filled 2017-02-28: qty 1

## 2017-02-28 MED ORDER — FENTANYL CITRATE (PF) 100 MCG/2ML IJ SOLN
25.0000 ug | INTRAMUSCULAR | Status: DC | PRN
  Administered 2017-03-01 (×3): 25 ug via INTRAVENOUS
  Filled 2017-02-28 (×3): qty 2

## 2017-02-28 NOTE — ED Notes (Signed)
Report given to [facility]. 

## 2017-02-28 NOTE — Progress Notes (Signed)
Pharmacy Consult for phenytoin   Allergies  Allergen Reactions  . Tape Rash    Patient Measurements:   TBW: 59kg  Vital Signs: BP: 142/52 (06/23 0330) Pulse Rate: 94 (06/23 0330)  Labs:  Recent Labs  02/26/17 1258 02/27/17 0357 02/28/17 0308  WBC 8.5  --  11.0*  HGB 11.6*  --  11.8*  PLT 167  --  231  CREATININE 0.95 0.72  --     Estimated Creatinine Clearance: 51 mL/min (by C-G formula based on SCr of 0.72 mg/dL).  No results for input(s): VANCOTROUGH, VANCOPEAK, VANCORANDOM, GENTTROUGH, GENTPEAK, GENTRANDOM, TOBRATROUGH, TOBRAPEAK, TOBRARND, AMIKACINPEAK, AMIKACINTROU, AMIKACIN in the last 72 hours.   Microbiology: No results found for this or any previous visit (from the past 720 hour(s)).  Medical History: Past Medical History:  Diagnosis Date  . Anemia   . Aortic insufficiency    Moderate  . Back pain   . Carotid stenosis, bilateral   . Cirrhosis ([place])   . Coronary artery disease    Stent x 2 RCA 1995, cardiac catheterization 09/2016 showing only mild atherosclerosis  . DDD (degenerative disc disease), lumbar   . Essential hypertension   . GERD (gastroesophageal reflux disease)   . Hiatal hernia   . History of stroke   . Hypercholesteremia   . IBS (irritable bowel syndrome)   . Myocardial infarction ([place]) 1995  . Non-alcoholic fatty liver disease   . OSA (obstructive sleep apnea)    Sleep study 2009  AHI 9.91/hr and during REM sleep 35.58/hr  . Pericardial effusion    a. small by echo 2018.  Marland Kitchen PSVT (paroxysmal supraventricular tachycardia) ([place])   . Recurrent UTI   . Type 2 diabetes mellitus ([place])   . Varices, esophageal ([facility])     Medications:  Scheduled:  . LORazepam        Assessment: 76 yo in ED with seizures uncontrolled with lorazepam given x 2 via EMS.  Asked to start phenytoin per Rx.   Plan:  Preliminary review of pertinent patient information completed.  Protocol will be initiated with dose(s) of phenytoin 1021m IV x 1.  AForestine Naclinical pharmacist will complete review during morning rounds to assess patient and finalize treatment regimen if needed.  MNyra Capes RPH 02/28/2017,3:47 AM

## 2017-02-28 NOTE — Progress Notes (Signed)
[facility] PLAN OF CARE NOTE Patient: [name]   PCP: Barbara School, MD DOB: 1940/10/25   DOA: 02/28/2017   DOS: 02/28/2017    Patient was admitted by my colleague Dr. Marin Comment earlier on 02/28/2017. I have reviewed the H&P as well as assessment and plan and agree with the same. Important changes in the plan are listed below.  Plan of care: Principal Problem:   Seizure ([place]) Active Problems:   DM type 2 (diabetes mellitus, type 2) ([place])   Hypercholesteremia   Presence of stent in right coronary artery   HTN (hypertension)   Hepatic cirrhosis ([facility])   PSVT (paroxysmal supraventricular tachycardia) ([facility])   Cerebral infarction ([facility])   Elevated troponin   Hip fracture [facility]) Orthopedic was consulted again, we will be performing a procedure on Monday as long as the patient is cleared medically. Neurology consulted, moderate brain currently pending, EKG currently pending. Continue Dilantin. Get speech evaluation for swallowing.  Author: Berle Mull, MD [facility] Pager: [phone] 02/28/2017 6:39 PM   If 7PM-7AM, please contact night-coverage at [email or web address], password [facility]

## 2017-02-28 NOTE — Progress Notes (Signed)
[name] is a 76 y.o. female patient admitted from ED awake, alert - disoriented and intermittent confusion - no acute distress noted.  VSS - Blood pressure (!) 129/44, pulse 92, temperature 98 F (36.7 C), resp. rate 20, height 5' 3"  (1.6 m), weight 59 kg (130 lb 1.1 oz), SpO2 100 %.    IV in place, occlusive dsg intact without redness.  Orientation to room, and floor completed with information packet given to patient.  Patient declined safety video at this time.  Admission INP armband ID verified with patient, and in place.   SR up x 3, fall assessment complete, with patient unable to verbalize understanding of risk associated with falls, or verbalize understanding to call nsg before up out of bed.  Call light within reach, patient unable to voice, and demonstrate understanding.  Skin, clean-dry- intact without evidence of bruising, or skin tears. Pt cannot accept that she has a foley inserted, states "I do not have  A foley, look there is no foley". Patient also states she has not had a seizure or that she had a fall at home. No evidence of skin break down noted on exam, however pt does have a bruise on her left hand..     Will cont to eval and treat per MD orders.  Milas Hock, RN 02/28/2017 6:47 PM

## 2017-02-28 NOTE — Progress Notes (Signed)
MEDICATION RELATED CONSULT NOTE - FOLLOW UP  Pharmacy Consult for Dilantin Indication: new onset status epilepticus    Allergies  Allergen Reactions  . Tape Rash    Patient Measurements: Height: 5' 3"  (160 cm) (from 02/26/17 4:18 PM) Weight: 130 lb 1.1 oz (59 kg) (from 02/26/17 4:18 PM) IBW/kg (Calculated) : 52.4   Vital Signs: Temp: 98 F (36.7 C) (06/23 0936) BP: 119/43 (06/23 0936) Pulse Rate: 70 (06/23 0936) Intake/Output from previous day: 06/22 0701 - 06/23 0700 In: 270 [IV Piggyback:270] Out: -  Intake/Output from this shift: No intake/output data recorded.  Labs:  Recent Labs  02/26/17 1258 02/27/17 0357 02/28/17 0308  WBC 8.5  --  11.0*  HGB 11.6*  --  11.8*  HCT 35.5*  --  35.7*  PLT 167  --  231  CREATININE 0.95 0.72 1.30*  MG  --  1.4* 2.0  ALBUMIN  --   --  4.0  PROT  --   --  7.1  AST  --   --  32  ALT  --   --  20  ALKPHOS  --   --  76  BILITOT  --   --  0.4   Estimated Creatinine Clearance: 31.4 mL/min (A) (by C-G formula based on SCr of 1.3 mg/dL (H)).   Assessment: 76 yo in ED at [facility] with  status epilepticus. Seizures uncontrolled with lorazepam given x 2 via EMS. Pharmacy consulted to start phenytoin, started 02/28/17  Her further work up included a head CT which showed old lacunar infarct but no acute process. Dilantin 1g IV x1 given early AM at [facility] ED given @ [phone] 6/23 Neuro consulted and  recommends to continue Dilantin EEG, MRI brain.  Wt 59 kg , IBW 52.4 kg LFTs wnl   Goal of Therapy:  Dilantin level 10-73mg/ml   Plan:  Dilantin 100 mg IV q8h (5-658mkg/day used IBW)  Check dilantin level in 5-7 days   RuNicole CellaRPh Clinical Pharmacist Pager: [phone]/23/2018,1:12 PM

## 2017-02-28 NOTE — ED Notes (Signed)
Attempted to call report and secretary stated that they do not receive report at this time.  Advised Network engineer that [place] would be here shortly to get patient.

## 2017-02-28 NOTE — ED Provider Notes (Signed)
[facility] DEPT Provider Note   CSN: [id number] Arrival date & time: 02/28/17  0234     History   Chief Complaint Chief Complaint  Patient presents with  . Seizures   LEVEL 5 CAVEAT DUE TO ACUITY OF CONDITION  HPI Casandra Dallaire is a 76 y.o. female.  The history is provided by the EMS personnel. The history is limited by the condition of the patient.  Seizures   This is a new problem. Episode onset: UNKNOWN. The problem has been gradually improving. The most recent episode lasted more than 5 minutes. There has been no fever.  Patient presents with seizures Per EMS, she has been seizing for at least 15 minutes They were called to home for seizures, but no information given by family En route, pt was given ativan 75m Initial seizure focus was generalized tonic clonic seizure No other details are known at arrival  Past Medical History:  Diagnosis Date  . Anemia   . Aortic insufficiency    Moderate  . Back pain   . Carotid stenosis, bilateral   . Cirrhosis ([place]   . Coronary artery disease    Stent x 2 RCA 1995, cardiac catheterization 09/2016 showing only mild atherosclerosis  . DDD (degenerative disc disease), lumbar   . Essential hypertension   . GERD (gastroesophageal reflux disease)   . Hiatal hernia   . History of stroke   . Hypercholesteremia   . IBS (irritable bowel syndrome)   . Myocardial infarction ([place] 1995  . Non-alcoholic fatty liver disease   . OSA (obstructive sleep apnea)    Sleep study 2009  AHI 9.91/hr and during REM sleep 35.58/hr  . Pericardial effusion    a. small by echo 2018.  .Marland KitchenPSVT (paroxysmal supraventricular tachycardia) ([place]   . Recurrent UTI   . Type 2 diabetes mellitus ([place]   . Varices, esophageal ([facility]     Patient Active Problem List   Diagnosis Date Noted  . Chest pain 12/07/2016  . Atypical chest pain 12/07/2016  . Tachycardia   . NSTEMI (non-ST elevated myocardial infarction) ([place] 11/02/2016  . Multifocal atrial  tachycardia ([place] 09/25/2016  . Elevated troponin 09/24/2016  . Hypokalemia 09/23/2016  . TIA (transient ischemic attack) 05/05/2016  . Intractable nausea and vomiting 10/15/2015  . Acute coronary syndrome ([place] 06/01/2015  . Bronchitis 06/01/2015  . Thrombocytopenia ([place] 06/01/2015  . CVA (cerebral infarction) 03/06/2014  . UTI (lower urinary tract infection)   . PSVT (paroxysmal supraventricular tachycardia) (HB[place] 12/09/2013  . Esophageal varices ([place] 03/29/2013  . Hematemesis 03/29/2013  . Hepatic cirrhosis (HP[place] 03/29/2013  . Presence of stent in right coronary artery 07/04/2011  . OSA (obstructive sleep apnea) 07/04/2011  . Aortic sclerosis 07/04/2011  . Aortic insufficiency 07/04/2011  . HTN (hypertension) 07/04/2011  . Carotid stenosis, bilateral 07/04/2011  . Chest pain at rest 07/03/2011  . CAD (coronary artery disease) 07/03/2011  . DM type 2 (diabetes mellitus, type 2) ([place] 07/03/2011  . Hypercholesteremia 07/03/2011  . GERD (gastroesophageal reflux disease) 07/03/2011    Past Surgical History:  Procedure Laterality Date  . [place] [facility] APPENDECTOMY    . BACK SURGERY  1985  . CARDIAC CATHETERIZATION  [phone]  Stent to the proximal RCA after MI   . CARDIAC CATHETERIZATION N/A 09/26/2016   Procedure: Left Heart Cath and Coronary Angiography;  Surgeon: DLeonie Man MD;  [place];  Service: Cardiovascular;  Laterality: N/A;  . CATARACT EXTRACTION W/PHACO  Right 07/04/2014   Procedure: CATARACT EXTRACTION PHACO AND INTRAOCULAR LENS PLACEMENT (IOC);  Surgeon: Elta Guadeloupe T. Gershon Crane, MD;  Location: [facility];  Service: Ophthalmology;  Laterality: Right;  CDE:13.13  . COLONOSCOPY    . DILATION AND CURETTAGE OF UTERUS     x2  . Epi Retinal Membrane Peel Left   . ERCP    . ESOPHAGEAL BANDING N/A 04/01/2013   Procedure: ESOPHAGEAL BANDING;  Surgeon: Rogene Houston, MD;  Location: [facility] SUITE;  Service: Endoscopy;  Laterality: N/A;    . ESOPHAGEAL BANDING N/A 05/24/2013   Procedure: ESOPHAGEAL BANDING;  Surgeon: Rogene Houston, MD;  Location[facility];  Service: Endoscopy;  Laterality: N/A;  . ESOPHAGEAL BANDING N/A 06/21/2014   Procedure: ESOPHAGEAL BANDING;  Surgeon: Rogene Houston, MD;  Location[facility];  Service: Endoscopy;  Laterality: N/A;  . ESOPHAGOGASTRODUODENOSCOPY N/A 04/01/2013   Procedure: ESOPHAGOGASTRODUODENOSCOPY (EGD);  Surgeon: Rogene Houston, MD;  [facility];  Service: Endoscopy;  Laterality: N/A;  230-rescheduled to 8:30am Ann notified pt  . ESOPHAGOGASTRODUODENOSCOPY N/A 05/24/2013   Procedure: ESOPHAGOGASTRODUODENOSCOPY (EGD);  Surgeon: Rogene Houston, MD;  [facility];  Service: Endoscopy;  Laterality: N/A;  730  . ESOPHAGOGASTRODUODENOSCOPY N/A 06/21/2014   Procedure: ESOPHAGOGASTRODUODENOSCOPY (EGD);  Surgeon: Rogene Houston, MD;  Location[facility];  Service: Endoscopy;  Laterality: N/A;  930-rescheduled 10/14 @ 1200 Ann to notify pt  . EYE SURGERY  08   cataract surgery of the left eye  . HARDWARE REMOVAL Right 01/17/2013   Procedure: REMOVAL OF HARDWARE AND EXCISION ULNAR STYLOID RIGHT WRIST;  Surgeon: Tennis Must, MD;  Location: [facility];  Service: Orthopedics;  Laterality: Right;  . MYRINGOTOMY  2012   both ears  . TONSILLECTOMY    . VAGINAL HYSTERECTOMY  1972  . WRIST SURGERY     rt wrist hardwear removal    OB History    No data available       Home Medications    Prior to Admission medications   Medication Sig Start Date End Date Taking? Authorizing Provider  aspirin 81 MG chewable tablet Chew 243 mg by mouth at bedtime.    [provider]  atorvastatin (LIPITOR) 40 MG tablet Take 40 mg by mouth at bedtime.     [provider]  carvedilol (COREG) 25 MG tablet Take 1 tablet (25 mg total) by mouth 2 (two) times daily. 01/27/17   Lendon Colonel, NP  diltiazem (CARDIZEM CD) 180 MG 24 hr capsule Take  1 capsule (180 mg total) by mouth daily. 02/28/17 03/30/17  Arrien, Jimmy Picket, MD  ibuprofen (ADVIL) 200 MG tablet Take 200 mg by mouth every 6 (six) hours as needed.    [provider]  isosorbide mononitrate (IMDUR) 30 MG 24 hr tablet Take 30 mg by mouth at bedtime.     [provider]  lisinopril (PRINIVIL,ZESTRIL) 20 MG tablet Take 20 mg by mouth daily.    [provider]  pantoprazole (PROTONIX) 40 MG tablet Take 40 mg by mouth daily.    [provider]  potassium chloride SA (K-DUR,KLOR-CON) 20 MEQ tablet Take 1 tablet (20 mEq total) by mouth daily. 12/09/16   Lendon Colonel, NP    Family History Family History  Problem Relation Age of Onset  . Diabetes Mother   . Heart failure Father   . Heart failure Maternal Aunt     Social History Social History  Substance Use Topics  . Smoking status: Former Smoker    Packs/day: 0.25    Years: 50.00    Types: Cigarettes    Quit date: 01/13/2012  . Smokeless tobacco: Never Used  . Alcohol use No     Allergies   Tape   Review of Systems Review of Systems  Unable to perform ROS: Acuity of condition  Neurological: Positive for seizures.     Physical Exam Updated Vital Signs BP (!) 113/47   Pulse 82   Resp 19   SpO2 95%   Physical Exam CONSTITUTIONAL: elderly, ill appearing HEAD: Normocephalic/atraumatic EYES: eyes deviated to right, pupils equal and reactive ENMT: Mucous membranes moist NECK: supple no meningeal signs SPINE/BACK:No bruising/crepitance/stepoffs noted to spine CV: S1/S2 noted, no murmurs/rubs/gallops noted LUNGS: Lungs are clear to auscultation bilaterally, no apparent distress ABDOMEN: soft NEURO: Pt has eyes open, eyes initially deviated to right.  However, she has contractions of left UE, ?contractions to left LE.  No SZ activity noted on right side of body.   EXTREMITIES: pulses normal/equal, ?shortening of left LE but no signs of trauma SKIN: warm, color  normal PSYCH: unable to assess  ED Treatments / Results  Labs (all labs ordered are listed, but only abnormal results are displayed) Labs Reviewed  COMPREHENSIVE METABOLIC PANEL - Abnormal; Notable for the following:       Result Value   CO2 21 (*)    Glucose, Bld 208 (*)    Creatinine, Ser 1.30 (*)    GFR calc non Af Amer 39 (*)    GFR calc Af Amer 46 (*)    All other components within normal limits  CBC WITH DIFFERENTIAL/PLATELET - Abnormal; Notable for the following:    WBC 11.0 (*)    Hemoglobin 11.8 (*)    HCT 35.7 (*)    All other components within normal limits  CBG MONITORING, ED - Abnormal; Notable for the following:    Glucose-Capillary 175 (*)    All other components within normal limits  MAGNESIUM    EKG  EKG Interpretation  Date/Time:  Saturday February 28 2017 02:41:00 EDT Ventricular Rate:  96 PR Interval:    QRS Duration: 102 QT Interval:  359 QTC Calculation: 332 R Axis:   2 Text Interpretation:  Sinus rhythm Prolonged PR interval Anteroseptal infarct, age indeterminate Interpretation limited secondary to artifact No significant change since last tracing Confirmed by Ripley Fraise [phone]) on 02/28/2017 2:45:44 AM       Radiology Dg Chest 1 View  Result Date: 02/28/2017 CLINICAL DATA:  Seizures, pain. EXAM: PELVIS - 1-2 VIEW; CHEST  1 VIEW COMPARISON:  CT abdomen and pelvis July 09, 2015 FINDINGS: Chest: Cardiac silhouette is mildly enlarged, calcified aortic knob. No pleural effusion or focal consolidation. Elevated LEFT hemidiaphragm with strandy densities. No pneumothorax. Soft tissue planes and included osseous structures are nonsuspicious. Pelvis: Acute comminuted LEFT femur intertrochanteric fracture with impaction. Varus angulation of the distal bony fragments. Femoral heads are located. Osteopenia without destructive bony lesions. Moderate vascular calcifications. IMPRESSION: Chest: Mild cardiomegaly, LEFT lung base atelectasis. Pelvis: Acute LEFT  femur intertrochanteric fracture. Electronically Signed   By: Elon Alas M.D.   On: 02/28/2017 04:11   Dg Chest 2 View  Result Date: 02/26/2017 CLINICAL DATA:  CENTER CHEST PAIN SINCE LAST NIGHT, HX OF MI X 2, MOST RECENT 2 YEARS AGO, FORMER SMOKER X 4 YEARS EXAM: CHEST  2 VIEW COMPARISON:  12/07/2016 FINDINGS: Lungs are hyperinflated. Heart size is normal. There  are no focal consolidations or pleural effusions. No pulmonary edema. Degenerative changes are seen in thoracic spine. IMPRESSION: No active cardiopulmonary disease. Electronically Signed   By: Nolon Nations M.D.   On: 02/26/2017 12:43   Dg Pelvis 1-2 Views  Result Date: 02/28/2017 CLINICAL DATA:  Seizures, pain. EXAM: PELVIS - 1-2 VIEW; CHEST  1 VIEW COMPARISON:  CT abdomen and pelvis July 09, 2015 FINDINGS: Chest: Cardiac silhouette is mildly enlarged, calcified aortic knob. No pleural effusion or focal consolidation. Elevated LEFT hemidiaphragm with strandy densities. No pneumothorax. Soft tissue planes and included osseous structures are nonsuspicious. Pelvis: Acute comminuted LEFT femur intertrochanteric fracture with impaction. Varus angulation of the distal bony fragments. Femoral heads are located. Osteopenia without destructive bony lesions. Moderate vascular calcifications. IMPRESSION: Chest: Mild cardiomegaly, LEFT lung base atelectasis. Pelvis: Acute LEFT femur intertrochanteric fracture. Electronically Signed   By: Elon Alas M.D.   On: 02/28/2017 04:11   Dg Ankle 2 Views Left  Result Date: 02/28/2017 CLINICAL DATA:  Seizure.  No additional history provided. EXAM: LEFT ANKLE - 2 VIEW COMPARISON:  None. FINDINGS: There is no evidence of fracture, dislocation, or joint effusion. Ankle mortise is preserved. There is a small plantar calcaneal spur. No bony destructive change or significant arthropathy. There is diffuse soft tissue edema. IMPRESSION: Diffuse soft tissue edema.  No acute osseous abnormality.  Electronically Signed   By: Jeb Levering M.D.   On: 02/28/2017 04:10   Ct Head Wo Contrast  Result Date: 02/28/2017 CLINICAL DATA:  76 year old female with seizures. EXAM: CT HEAD WITHOUT CONTRAST TECHNIQUE: Contiguous axial images were obtained from the base of the skull through the vertex without intravenous contrast. COMPARISON:  Brain MRI dated 05/05/2016 FINDINGS: Brain: There is moderate brain atrophy and chronic microvascular ischemic changes. An area of old infarct and encephalomalacia noted in the right frontal lobe. Small bilateral basal ganglia old lacunar infarct noted. There is no acute intracranial hemorrhage. No mass effect or midline shift noted. No extra-axial fluid collection. Vascular: No hyperdense vessel or unexpected calcification. Skull: Normal. Negative for fracture or focal lesion. Sinuses/Orbits: No acute finding. Other: None IMPRESSION: 1. No acute intracranial hemorrhage. 2. Moderate age-related atrophy and chronic microvascular ischemic changes. Old right frontal infarct. Electronically Signed   By: Anner Crete M.D.   On: 02/28/2017 03:38    Procedures Procedures  CRITICAL CARE Performed by: Sharyon Cable Total critical care time: 35 minutes Critical care time was exclusive of separately billable procedures and treating other patients. Critical care was necessary to treat or prevent imminent or life-threatening deterioration. Critical care was time spent personally by me on the following activities: development of treatment plan with patient and/or surrogate as well as nursing, discussions with consultants, evaluation of patient's response to treatment, examination of patient, obtaining history from patient or surrogate, ordering and performing treatments and interventions, ordering and review of laboratory studies, ordering and review of radiographic studies, pulse oximetry and re-evaluation of patient's condition.   Medications Ordered in ED Medications    LORazepam (ATIVAN) 2 MG/ML injection (not administered)  phenytoin (DILANTIN) 1,000 mg in sodium chloride 0.9 % 250 mL IVPB (1,000 mg Intravenous New Bag/Given 02/28/17 0425)  LORazepam (ATIVAN) injection 2 mg (2 mg Intravenous Given 02/28/17 0243)  LORazepam (ATIVAN) injection 1 mg (1 mg Intravenous Given 02/28/17 0240)     Initial Impression / Assessment and Plan / ED Course  I have reviewed the triage vital signs and the nursing notes.  Pertinent labs & imaging results that  were available during my care of the patient were reviewed by me and considered in my medical decision making (see chart for details).     2:49 AM Pt seen on arrival for seizure She does not appear to have h/o seizures She was given ativan and appears to be improving Will need CT head ?left LE shortening, will image hip CXR also ordered 4:50 AM PT STABILIZED SHE IS AFEBRILE  PT WITH STATUS EPILEPTICUS THAT RESPONDED TO IV ATIVAN SHE IS NOW RESTING COMFORTABLY, NO DISTRESS, NO HYPOXIA SHE APPEARS TO HAVE ACUTE LEFT HIP FRACTURE WHICH IS ACUTE.  UNCLEAR WHEN THIS OCCURRED, BUT SHE DID HAVE SHORTENED LEFT LE D/W SON VIA PHONE WHO IS POA (DR Casselman - [phone]) HE IS AWARE OF CONDITION OF PATIENT D/W DR Collier Salina LE WITH [facility] HE WILL ADMIT HE WILL CALL CONSULT TO ORTHO AND DEFER NEURO CONSULT FOR LATER IN THE MORNING    Final Clinical Impressions(s) / ED Diagnoses   Final diagnoses:  Status epilepticus ([place])  Closed fracture of left hip, initial encounter [facility])    New Prescriptions New Prescriptions   No medications on file     Ripley Fraise, MD 02/28/17 [phone]

## 2017-02-28 NOTE — Consult Note (Signed)
Neurology Consultation Reason for Consult: Seizure Referring Physician: Marlowe Sax  CC: Seizure  History is obtained from: Patient  HPI: [name] is a 76 y.o. female with a history of previous stroke with cortical encephalomalacia who presents with new onset seizures. She was just discharged yesterday with SVT and elevated troponin. She then presented to [facility] emergency department with new onset seizures which apparently were focal in the emergency department. She has since greatly improved following Dilantin load.   ROS: A 14 point ROS was performed and is negative except as noted in the HPI.   Past Medical History:  Diagnosis Date  . Anemia   . Aortic insufficiency    Moderate  . Back pain   . Carotid stenosis, bilateral   . Cirrhosis ([place])   . Coronary artery disease    Stent x 2 RCA 1995, cardiac catheterization 09/2016 showing only mild atherosclerosis  . DDD (degenerative disc disease), lumbar   . Essential hypertension   . GERD (gastroesophageal reflux disease)   . Hiatal hernia   . History of stroke   . Hypercholesteremia   . IBS (irritable bowel syndrome)   . Myocardial infarction ([place]) 1995  . Non-alcoholic fatty liver disease   . OSA (obstructive sleep apnea)    Sleep study 2009  AHI 9.91/hr and during REM sleep 35.58/hr  . Pericardial effusion    a. small by echo 2018.  Marland Kitchen PSVT (paroxysmal supraventricular tachycardia) ([place])   . Recurrent UTI   . Type 2 diabetes mellitus ([place])   . Varices, esophageal ([facility])      Family History  Problem Relation Age of Onset  . Diabetes Mother   . Heart failure Father   . Heart failure Maternal Aunt      Social History:  reports that she quit smoking about 5 years ago. Her smoking use included Cigarettes. She has a 12.50 pack-year smoking history. She has never used smokeless tobacco. She reports that she does not drink alcohol or use drugs.   Exam: Current vital signs: BP (!) 119/43 (BP Location: Right Arm)    Pulse 70   Temp 98 F (36.7 C)   Resp 16   SpO2 100%  Vital signs in last 24 hours: Temp:  [98 F (36.7 C)] 98 F (36.7 C) (06/23 0936) Pulse Rate:  [61-98] 70 (06/23 0936) Resp:  [11-24] 16 (06/23 0936) BP: (82-167)/(33-59) 119/43 (06/23 0936) SpO2:  [95 %-100 %] 100 % (06/23 0936)   Physical Exam  Constitutional: Appears well-developed and well-nourished.  Psych: Affect appropriate to situation Eyes: No scleral injection HENT: No OP obstrucion Head: Normocephalic.  Cardiovascular: Normal rate and regular rhythm.  Respiratory: Effort normal and breath sounds normal to anterior ascultation GI: Soft.  No distension. There is no tenderness.  Skin: WDI  Neuro: Mental Status: Patient is awake, alert, oriented to person, place, month, year, and situation. Patient is able to give a clear and coherent history. No signs of aphasia. Though I do wonder if she attends to the left as well as the right, she does not have any extinction to double simultaneous stimulation  Cranial Nerves: II: Visual Fields are full. Pupils are equal, round, and reactive to light.   III,IV, VI: EOMI without ptosis or diploplia.  V: Facial sensation is symmetric to temperature VII: Facial movement is weak on the left VIII: hearing is intact to voice X: Uvula elevates symmetrically XI: Shoulder shrug is symmetric. XII: tongue is midline without atrophy or fasciculations.  Motor:  Tone is normal. Bulk is normal. She has mild left arm weakness and more significant left leg weakness, 2/5. Sensory: Sensation is symmetric to light touch and temperature in the arms and legs. Cerebellar: FNF  intact bilaterally  I have reviewed labs in epic and the results pertinent to this consultation are:  Borderline Creatinine  I have reviewed the images obtained: CT head-previous stroke in the right side  Impression: 76 year old female with new onset status epilepticus. She has mild left hemiparesis which could be  todds phenomenon, though recurrent stroke could be a possibility as well. I do think imaging would be prudent. She was started on Dilantin,  and this is reasonable though she were to develop further problems down on, could change to a newer agent.  Recommendations: 1) continue Dilantin 2) EEG 3) MRI brain   Roland Rack, MD [facility] [phone]  If 7pm- 7am, please page neurology on call as listed in [place].

## 2017-02-28 NOTE — H&P (Signed)
History and Physical    Azoria Abbett XHB:[id number] DOB: 1941-09-07 DOA: 02/28/2017  PCP: Redmond School, MD  Patient coming from: Home.    Chief Complaint:  Seizure.   HPI: Fawnda Vitullo is an 76 y.o. female with hx of prior CVA, recent admission for atypical chest pain with chronic troponin elevations, hx of anemia, non obstructive carotid disease, and known CAD s/p RCA stent, with cath Jan 2018 showing non obstructive CAD, discharged yesterday for a chest pain admission, brought to the ER as she was having status epilepticus. In the ER, she has reportedly focal seizures, and was given Ativan and subsequently loaded with IV Dilantin.  Her further work up included a head CT which showed old lacunar infarct but no acute process.  Labs showed Hb of 11 grams per dL, and WBC of 11K.  Her Cr was 1.3, and her troponin was 0.38 (baseline).  Noting her left leg was shorter and externally rotated, Xray showed acute left intertroch Fx.  Her EKG showed NSR with 1 degree AVB, no acute ischemic or injurious changes.  Hospitalist was asked to admit her for new onset of seizure, acute left hip Fx, chronic elevation of troponin, and mild AKI.   ED Course:  See above.  Rewiew of Systems: Unable.   Past Medical History:  Diagnosis Date  . Anemia   . Aortic insufficiency    Moderate  . Back pain   . Carotid stenosis, bilateral   . Cirrhosis ([place])   . Coronary artery disease    Stent x 2 RCA 1995, cardiac catheterization 09/2016 showing only mild atherosclerosis  . DDD (degenerative disc disease), lumbar   . Essential hypertension   . GERD (gastroesophageal reflux disease)   . Hiatal hernia   . History of stroke   . Hypercholesteremia   . IBS (irritable bowel syndrome)   . Myocardial infarction ([name]) 1995  . Non-alcoholic fatty liver disease   . OSA (obstructive sleep apnea)    Sleep study 2009  AHI 9.91/hr and during REM sleep 35.58/hr  . Pericardial effusion    a. small by echo 2018.  Marland Kitchen PSVT  (paroxysmal supraventricular tachycardia) ([place])   . Recurrent UTI   . Type 2 diabetes mellitus ([place])   . Varices, esophageal ([place])     Past Surgical History:  Procedure Laterality Date  . [place]  . APPENDECTOMY    . BACK SURGERY  1985  . CARDIAC CATHETERIZATION  [phone]   Stent to the proximal RCA after MI   . CARDIAC CATHETERIZATION N/A 09/26/2016   Procedure: Left Heart Cath and Coronary Angiography;  Surgeon: Leonie Man, MD;  Location: [facility];  Service: Cardiovascular;  Laterality: N/A;  . CATARACT EXTRACTION W/PHACO Right 07/04/2014   Procedure: CATARACT EXTRACTION PHACO AND INTRAOCULAR LENS PLACEMENT (IOC);  Surgeon: Elta Guadeloupe T. Gershon Crane, MD;  Location: [facility];  Service: Ophthalmology;  Laterality: Right;  CDE:13.13  . COLONOSCOPY    . DILATION AND CURETTAGE OF UTERUS     x2  . Epi Retinal Membrane Peel Left   . ERCP    . ESOPHAGEAL BANDING N/A 04/01/2013   Procedure: ESOPHAGEAL BANDING;  Surgeon: Rogene Houston, MD;  Location: [facility];  Service: Endoscopy;  Laterality: N/A;  . ESOPHAGEAL BANDING N/A 05/24/2013   Procedure: ESOPHAGEAL BANDING;  Surgeon: Rogene Houston, MD;  [facility];  Service: Endoscopy;  Laterality: N/A;  . ESOPHAGEAL BANDING N/A 06/21/2014   Procedure: ESOPHAGEAL BANDING;  Surgeon: Rogene Houston, MD;  [facility];  Service: Endoscopy;  Laterality: N/A;  . ESOPHAGOGASTRODUODENOSCOPY N/A 04/01/2013   Procedure: ESOPHAGOGASTRODUODENOSCOPY (EGD);  Surgeon: Rogene Houston, MD;  [facility];  Service: Endoscopy;  Laterality: N/A;  230-rescheduled to 8:30am Ann notified pt  . ESOPHAGOGASTRODUODENOSCOPY N/A 05/24/2013   Procedure: ESOPHAGOGASTRODUODENOSCOPY (EGD);  Surgeon: Rogene Houston, MD;  [facility];  Service: Endoscopy;  Laterality: N/A;  730  . ESOPHAGOGASTRODUODENOSCOPY N/A 06/21/2014   Procedure: ESOPHAGOGASTRODUODENOSCOPY (EGD);  Surgeon: Rogene Houston, MD;   [facility];  Service: Endoscopy;  Laterality: N/A;  930-rescheduled 10/14 @ 1200 Ann to notify pt  . EYE SURGERY  08   cataract surgery of the left eye  . HARDWARE REMOVAL Right 01/17/2013   Procedure: REMOVAL OF HARDWARE AND EXCISION ULNAR STYLOID RIGHT WRIST;  Surgeon: Tennis Must, MD;  Location: [facility];  Service: Orthopedics;  Laterality: Right;  . MYRINGOTOMY  2012   both ears  . TONSILLECTOMY    . VAGINAL HYSTERECTOMY  1972  . WRIST SURGERY     rt wrist hardwear removal     reports that she quit smoking about 5 years ago. Her smoking use included Cigarettes. She has a 12.50 pack-year smoking history. She has never used smokeless tobacco. She reports that she does not drink alcohol or use drugs.  Allergies  Allergen Reactions  . Tape Rash    Family History  Problem Relation Age of Onset  . Diabetes Mother   . Heart failure Father   . Heart failure Maternal Aunt      Prior to Admission medications   Medication Sig Start Date End Date Taking? Authorizing Provider  aspirin 81 MG chewable tablet Chew 243 mg by mouth at bedtime.    [provider]  atorvastatin (LIPITOR) 40 MG tablet Take 40 mg by mouth at bedtime.     [provider]  carvedilol (COREG) 25 MG tablet Take 1 tablet (25 mg total) by mouth 2 (two) times daily. 01/27/17   Lendon Colonel, NP  diltiazem (CARDIZEM CD) 180 MG 24 hr capsule Take 1 capsule (180 mg total) by mouth daily. 02/28/17 03/30/17  Arrien, Jimmy Picket, MD  ibuprofen (ADVIL) 200 MG tablet Take 200 mg by mouth every 6 (six) hours as needed.    [provider]  isosorbide mononitrate (IMDUR) 30 MG 24 hr tablet Take 30 mg by mouth at bedtime.     [provider]  lisinopril (PRINIVIL,ZESTRIL) 20 MG tablet Take 20 mg by mouth daily.    [provider]  pantoprazole (PROTONIX) 40 MG tablet Take 40 mg by mouth daily.    [provider]  potassium chloride SA  (K-DUR,KLOR-CON) 20 MEQ tablet Take 1 tablet (20 mEq total) by mouth daily. 12/09/16   Lendon Colonel, NP    Physical Exam: Vitals:   02/28/17 0330 02/28/17 0400 02/28/17 0449 02/28/17 0500  BP: (!) 142/52 (!) 113/47 (!) 107/56 (!) 101/47  Pulse: 94 82 72 70  Resp: 11 19 19 20   SpO2: 96% 95% 97% 98%      Constitutional: NAD, calm, comfortable Vitals:   02/28/17 0330 02/28/17 0400 02/28/17 0449 02/28/17 0500  BP: (!) 142/52 (!) 113/47 (!) 107/56 (!) 101/47  Pulse: 94 82 72 70  Resp: 11 19 19 20   SpO2: 96% 95% 97% 98%   Eyes: PERRL, lids and conjunctivae normal ENMT: Mucous membranes are moist. Posterior pharynx  clear of any exudate or lesions.Normal dentition.  Neck: normal, supple, no masses, no thyromegaly Respiratory: clear to auscultation bilaterally, no wheezing, no crackles. Normal respiratory effort. No accessory muscle use.  Cardiovascular: Regular rate and rhythm, no murmurs / rubs / gallops. No extremity edema. 2+ pedal pulses. No carotid bruits.  Abdomen: no tenderness, no masses palpated. No hepatosplenomegaly. Bowel sounds positive.  Musculoskeletal: no clubbing / cyanosis. She has externally rotated left leg and shorter than the right. no contractures. Normal muscle tone.  Skin: no rashes, lesions, ulcers. No induration Neurologic: CN 2-12 grossly intact. Sensation intact, DTR normal. Strength 5/5 in all 4.  Psychiatric: sedated.  Unable to evaluate.   Labs on Admission: I have personally reviewed following labs and imaging studies  CBC:  Recent Labs Lab 02/26/17 1258 02/28/17 0308  WBC 8.5 11.0*  NEUTROABS  --  7.6  HGB 11.6* 11.8*  HCT 35.5* 35.7*  MCV 85.1 85.6  PLT 167 505   Basic Metabolic Panel:  Recent Labs Lab 02/26/17 1258 02/27/17 0357 02/28/17 0308  NA 138 136 139  K 3.2* 3.9 4.1  CL 107 109 106  CO2 19* 19* 21*  GLUCOSE 194* 107* 208*  BUN 19 12 15   CREATININE 0.95 0.72 1.30*  CALCIUM 9.2 8.8* 9.2  MG  --  1.4* 2.0    GFR: Estimated Creatinine Clearance: 31.4 mL/min (A) (by C-G formula based on SCr of 1.3 mg/dL (H)). Liver Function Tests:  Recent Labs Lab 02/28/17 0308  AST 32  ALT 20  ALKPHOS 76  BILITOT 0.4  PROT 7.1  ALBUMIN 4.0   Coagulation Profile:  Recent Labs Lab 02/26/17 1444  INR 1.16   Cardiac Enzymes:  Recent Labs Lab 02/26/17 1258 02/26/17 1610 02/26/17 2150 02/27/17 0357  TROPONINI 0.32* 0.35* 0.35* 0.38*   CBG:  Recent Labs Lab 02/26/17 2233 02/27/17 0733 02/27/17 1110 02/28/17 0242  GLUCAP 163* 110* 147* 175*   Urine analysis:    Component Value Date/Time   COLORURINE YELLOW 02/08/2016 0205   APPEARANCEUR HAZY (A) 02/08/2016 0205   LABSPEC 1.025 02/08/2016 0205   PHURINE 5.5 02/08/2016 0205   GLUCOSEU NEGATIVE 02/08/2016 0205   HGBUR NEGATIVE 02/08/2016 0205   BILIRUBINUR NEGATIVE 02/08/2016 0205   KETONESUR NEGATIVE 02/08/2016 0205   PROTEINUR TRACE (A) 02/08/2016 0205   UROBILINOGEN 0.2 07/09/2015 0930   NITRITE NEGATIVE 02/08/2016 0205   LEUKOCYTESUR NEGATIVE 02/08/2016 0205   Radiological Exams on Admission: Dg Chest 1 View  Result Date: 02/28/2017 CLINICAL DATA:  Seizures, pain. EXAM: PELVIS - 1-2 VIEW; CHEST  1 VIEW COMPARISON:  CT abdomen and pelvis July 09, 2015 FINDINGS: Chest: Cardiac silhouette is mildly enlarged, calcified aortic knob. No pleural effusion or focal consolidation. Elevated LEFT hemidiaphragm with strandy densities. No pneumothorax. Soft tissue planes and included osseous structures are nonsuspicious. Pelvis: Acute comminuted LEFT femur intertrochanteric fracture with impaction. Varus angulation of the distal bony fragments. Femoral heads are located. Osteopenia without destructive bony lesions. Moderate vascular calcifications. IMPRESSION: Chest: Mild cardiomegaly, LEFT lung base atelectasis. Pelvis: Acute LEFT femur intertrochanteric fracture. Electronically Signed   By: Elon Alas M.D.   On: 02/28/2017 04:11    Dg Chest 2 View  Result Date: 02/26/2017 CLINICAL DATA:  CENTER CHEST PAIN SINCE LAST NIGHT, HX OF MI X 2, MOST RECENT 2 YEARS AGO, FORMER SMOKER X 4 YEARS EXAM: CHEST  2 VIEW COMPARISON:  12/07/2016 FINDINGS: Lungs are hyperinflated. Heart size is normal. There are no focal consolidations or pleural effusions. No  pulmonary edema. Degenerative changes are seen in thoracic spine. IMPRESSION: No active cardiopulmonary disease. Electronically Signed   By: Nolon Nations M.D.   On: 02/26/2017 12:43   Dg Pelvis 1-2 Views  Result Date: 02/28/2017 CLINICAL DATA:  Seizures, pain. EXAM: PELVIS - 1-2 VIEW; CHEST  1 VIEW COMPARISON:  CT abdomen and pelvis July 09, 2015 FINDINGS: Chest: Cardiac silhouette is mildly enlarged, calcified aortic knob. No pleural effusion or focal consolidation. Elevated LEFT hemidiaphragm with strandy densities. No pneumothorax. Soft tissue planes and included osseous structures are nonsuspicious. Pelvis: Acute comminuted LEFT femur intertrochanteric fracture with impaction. Varus angulation of the distal bony fragments. Femoral heads are located. Osteopenia without destructive bony lesions. Moderate vascular calcifications. IMPRESSION: Chest: Mild cardiomegaly, LEFT lung base atelectasis. Pelvis: Acute LEFT femur intertrochanteric fracture. Electronically Signed   By: Elon Alas M.D.   On: 02/28/2017 04:11   Dg Ankle 2 Views Left  Result Date: 02/28/2017 CLINICAL DATA:  Seizure.  No additional history provided. EXAM: LEFT ANKLE - 2 VIEW COMPARISON:  None. FINDINGS: There is no evidence of fracture, dislocation, or joint effusion. Ankle mortise is preserved. There is a small plantar calcaneal spur. No bony destructive change or significant arthropathy. There is diffuse soft tissue edema. IMPRESSION: Diffuse soft tissue edema.  No acute osseous abnormality. Electronically Signed   By: Jeb Levering M.D.   On: 02/28/2017 04:10   Ct Head Wo Contrast  Result Date:  02/28/2017 CLINICAL DATA:  76 year old female with seizures. EXAM: CT HEAD WITHOUT CONTRAST TECHNIQUE: Contiguous axial images were obtained from the base of the skull through the vertex without intravenous contrast. COMPARISON:  Brain MRI dated 05/05/2016 FINDINGS: Brain: There is moderate brain atrophy and chronic microvascular ischemic changes. An area of old infarct and encephalomalacia noted in the right frontal lobe. Small bilateral basal ganglia old lacunar infarct noted. There is no acute intracranial hemorrhage. No mass effect or midline shift noted. No extra-axial fluid collection. Vascular: No hyperdense vessel or unexpected calcification. Skull: Normal. Negative for fracture or focal lesion. Sinuses/Orbits: No acute finding. Other: None IMPRESSION: 1. No acute intracranial hemorrhage. 2. Moderate age-related atrophy and chronic microvascular ischemic changes. Old right frontal infarct. Electronically Signed   By: Anner Crete M.D.   On: 02/28/2017 03:38   EKG: Independently reviewed.   Assessment/Plan Principal Problem:   Seizure ([place]) Active Problems:   DM type 2 (diabetes mellitus, type 2) ([place])   Hypercholesteremia   Presence of stent in right coronary artery   HTN (hypertension)   Hepatic cirrhosis ([facility])   PSVT (paroxysmal supraventricular tachycardia) ([facility])   Cerebral infarction ([facility])   Elevated troponin   Hip fracture ([facility])   PLAN:      New onset of Seizure:  CBG normal, no epileptogenic medication.  Will continue with IV Dilantin per pharmacy dosing.  Transfer to [facility] as no neurology coverage at [facility].  Will obtain EEG.  Consult neurology in the am.  I spoke with Dr Loel Dubonnet and neurology will consult upon arrival to [facility].   Left intertrochanteric Fx:  Probably due to the fall with her seizure.  Will consult orthopedics.  Give IVF and make NPO.  Spoke with Dr Delfino Lovett who will see her in consultation in the morning.  He requested x ray of the femur.   Elevated troponins:  This  is not new.  She was seen by cardiology last admission, and was not felt to be ACS.  She was discharged yesterday.  She had a Cath in  Jan 2018, and was found to have non obstructive CAD (RCA stent patent, EF 55-65% .  ECHO with mild/mod AI, valve area 1.83 cm square.   Continue with cardiac meds.    DVT prophylaxis: sub Q heparin.  Code Status: FULL CODE.  Family Communication: Dr Leonides Schanz, her son, is aware per EDP.  He is POA.  Disposition Plan: TBD.   Consults called: None. Admission status: inpatient.    Jakayla Schweppe MD FACP. [facility]  If 7PM-7AM, please contact night-coverage [email or web address] Password [facility]  02/28/2017, 5:13 AM

## 2017-02-28 NOTE — ED Triage Notes (Signed)
Pt brought in by [facility] for continued seizures tonight.  EMS gave 2 mg ativan en route without improvement

## 2017-02-28 NOTE — ED Notes (Signed)
POA son (Dr. Leonides Schanz) [phone]

## 2017-02-28 NOTE — Consult Note (Signed)
Patient ID: [name] MRN: [id number] DOB/AGE: Dec 28, 1940 77 y.o.  Admit date: 02/28/2017  Admission Diagnoses:  Principal Problem:   Seizure ([place]) Active Problems:   DM type 2 (diabetes mellitus, type 2) ([place])   Hypercholesteremia   Presence of stent in right coronary artery   HTN (hypertension)   Hepatic cirrhosis (HCC)   PSVT (paroxysmal supraventricular tachycardia) (HCC)   Cerebral infarction (HCC)   Elevated troponin   Hip fracture (HCC)   HPI: Pleasant 76 year old female pt with a past med hx of Cardiac disease, anemia, DM2.  She reports going to bed last night and waking up in the hospital.  She presented to the ED with status epilepticus.  Pt reports pain in her left hip.  Denies other pain.      Past Medical History: Past Medical History:  Diagnosis Date  . Anemia   . Aortic insufficiency    Moderate  . Back pain   . Carotid stenosis, bilateral   . Cirrhosis ([place])   . Coronary artery disease    Stent x 2 RCA 1995, cardiac catheterization 09/2016 showing only mild atherosclerosis  . DDD (degenerative disc disease), lumbar   . Essential hypertension   . GERD (gastroesophageal reflux disease)   . Hiatal hernia   . History of stroke   . Hypercholesteremia   . IBS (irritable bowel syndrome)   . Myocardial infarction ([place]) 1995  . Non-alcoholic fatty liver disease   . OSA (obstructive sleep apnea)    Sleep study 2009  AHI 9.91/hr and during REM sleep 35.58/hr  . Pericardial effusion    a. small by echo 2018.  Marland Kitchen PSVT (paroxysmal supraventricular tachycardia) ([place])   . Recurrent UTI   . Type 2 diabetes mellitus ([place])   . Varices, esophageal ([place])     Surgical History: Past Surgical History:  Procedure Laterality Date  . [place]  . APPENDECTOMY    . BACK SURGERY  1985  . CARDIAC CATHETERIZATION  [id number]   Stent to the proximal RCA after MI   . CARDIAC CATHETERIZATION N/A 09/26/2016   Procedure: Left Heart Cath and  Coronary Angiography;  Surgeon: Leonie Man, MD;  Location: [facility];  Service: Cardiovascular;  Laterality: N/A;  . CATARACT EXTRACTION W/PHACO Right 07/04/2014   Procedure: CATARACT EXTRACTION PHACO AND INTRAOCULAR LENS PLACEMENT (IOC);  Surgeon: Elta Guadeloupe T. Gershon Crane, MD;  Location: [facility];  Service: Ophthalmology;  Laterality: Right;  CDE:13.13  . COLONOSCOPY    . DILATION AND CURETTAGE OF UTERUS     x2  . Epi Retinal Membrane Peel Left   . ERCP    . ESOPHAGEAL BANDING N/A 04/01/2013   Procedure: ESOPHAGEAL BANDING;  Surgeon: Rogene Houston, MD;  [facility];  Service: Endoscopy;  Laterality: N/A;  . ESOPHAGEAL BANDING N/A 05/24/2013   Procedure: ESOPHAGEAL BANDING;  Surgeon: Rogene Houston, MD;  [facility];  Service: Endoscopy;  Laterality: N/A;  . ESOPHAGEAL BANDING N/A 06/21/2014   Procedure: ESOPHAGEAL BANDING;  Surgeon: Rogene Houston, MD;  [facility];  Service: Endoscopy;  Laterality: N/A;  . ESOPHAGOGASTRODUODENOSCOPY N/A 04/01/2013   Procedure: ESOPHAGOGASTRODUODENOSCOPY (EGD);  Surgeon: Rogene Houston, MD;  [facility];  Service: Endoscopy;  Laterality: N/A;  230-rescheduled to 8:30am Ann notified pt  . ESOPHAGOGASTRODUODENOSCOPY N/A 05/24/2013   Procedure: ESOPHAGOGASTRODUODENOSCOPY (EGD);  Surgeon: Rogene Houston, MD;  [facility];  Service: Endoscopy;  Laterality:  N/A;  730  . ESOPHAGOGASTRODUODENOSCOPY N/A 06/21/2014   Procedure: ESOPHAGOGASTRODUODENOSCOPY (EGD);  Surgeon: Rogene Houston, MD;  [facility];  Service: Endoscopy;  Laterality: N/A;  930-rescheduled 10/14 @ 1200 Ann to notify pt  . EYE SURGERY  08   cataract surgery of the left eye  . HARDWARE REMOVAL Right 01/17/2013   Procedure: REMOVAL OF HARDWARE AND EXCISION ULNAR STYLOID RIGHT WRIST;  Surgeon: Tennis Must, MD;  Location: [facility];  Service: Orthopedics;  Laterality: Right;  . MYRINGOTOMY  2012   both ears    . TONSILLECTOMY    . VAGINAL HYSTERECTOMY  1972  . WRIST SURGERY     rt wrist hardwear removal    Family History: Family History  Problem Relation Age of Onset  . Diabetes Mother   . Heart failure Father   . Heart failure Maternal Aunt     Social History: Social History   Social History  . Marital status: Widowed    Spouse name: N/A  . Number of children: 2  . Years of education: N/A   Occupational History  .  Retired   Social History Main Topics  . Smoking status: Former Smoker    Packs/day: 0.25    Years: 50.00    Types: Cigarettes    Quit date: 01/13/2012  . Smokeless tobacco: Never Used  . Alcohol use No  . Drug use: No  . Sexual activity: Yes    Birth control/ protection: Surgical   Other Topics Concern  . Not on file   Social History Narrative  . No narrative on file    Allergies: Tape  Medications: I have reviewed the patient's current medications.  Vital Signs: Patient Vitals for the past 24 hrs:  BP Temp Pulse Resp SpO2 Height Weight  02/28/17 1513 (!) 129/44 - 92 20 100 % - -  02/28/17 1300 - - - - - 5' 3"  (1.6 m) 59 kg (130 lb 1.1 oz)  02/28/17 0936 (!) 119/43 98 F (36.7 C) 70 16 100 % - -  02/28/17 0800 (!) 123/43 - 75 18 98 % - -  02/28/17 0715 (!) 101/58 - - 17 - - -  02/28/17 0700 (!) 96/54 - - 16 - - -  02/28/17 0645 (!) 111/44 - 73 15 99 % - -  02/28/17 0630 (!) 95/34 - 65 13 99 % - -  02/28/17 0530 (!) 82/33 - 61 (!) 24 98 % - -  02/28/17 0500 (!) 101/47 - 70 20 98 % - -  02/28/17 0449 (!) 107/56 - 72 19 97 % - -  02/28/17 0400 (!) 113/47 - 82 19 95 % - -  02/28/17 0330 (!) 142/52 - 94 11 96 % - -  02/28/17 0300 (!) 167/59 - 98 20 100 % - -    Radiology: Dg Chest 1 View  Result Date: 02/28/2017 CLINICAL DATA:  Seizures, pain. EXAM: PELVIS - 1-2 VIEW; CHEST  1 VIEW COMPARISON:  CT abdomen and pelvis July 09, 2015 FINDINGS: Chest: Cardiac silhouette is mildly enlarged, calcified aortic knob. No pleural effusion or focal  consolidation. Elevated LEFT hemidiaphragm with strandy densities. No pneumothorax. Soft tissue planes and included osseous structures are nonsuspicious. Pelvis: Acute comminuted LEFT femur intertrochanteric fracture with impaction. Varus angulation of the distal bony fragments. Femoral heads are located. Osteopenia without destructive bony lesions. Moderate vascular calcifications. IMPRESSION: Chest: Mild cardiomegaly, LEFT lung base atelectasis. Pelvis: Acute LEFT femur intertrochanteric fracture. Electronically Signed  By: Elon Alas M.D.   On: 02/28/2017 04:11   Dg Chest 2 View  Result Date: 02/26/2017 CLINICAL DATA:  CENTER CHEST PAIN SINCE LAST NIGHT, HX OF MI X 2, MOST RECENT 2 YEARS AGO, FORMER SMOKER X 4 YEARS EXAM: CHEST  2 VIEW COMPARISON:  12/07/2016 FINDINGS: Lungs are hyperinflated. Heart size is normal. There are no focal consolidations or pleural effusions. No pulmonary edema. Degenerative changes are seen in thoracic spine. IMPRESSION: No active cardiopulmonary disease. Electronically Signed   By: Nolon Nations M.D.   On: 02/26/2017 12:43   Dg Pelvis 1-2 Views  Result Date: 02/28/2017 CLINICAL DATA:  Seizures, pain. EXAM: PELVIS - 1-2 VIEW; CHEST  1 VIEW COMPARISON:  CT abdomen and pelvis July 09, 2015 FINDINGS: Chest: Cardiac silhouette is mildly enlarged, calcified aortic knob. No pleural effusion or focal consolidation. Elevated LEFT hemidiaphragm with strandy densities. No pneumothorax. Soft tissue planes and included osseous structures are nonsuspicious. Pelvis: Acute comminuted LEFT femur intertrochanteric fracture with impaction. Varus angulation of the distal bony fragments. Femoral heads are located. Osteopenia without destructive bony lesions. Moderate vascular calcifications. IMPRESSION: Chest: Mild cardiomegaly, LEFT lung base atelectasis. Pelvis: Acute LEFT femur intertrochanteric fracture. Electronically Signed   By: Elon Alas M.D.   On: 02/28/2017 04:11     Dg Ankle 2 Views Left  Result Date: 02/28/2017 CLINICAL DATA:  Seizure.  No additional history provided. EXAM: LEFT ANKLE - 2 VIEW COMPARISON:  None. FINDINGS: There is no evidence of fracture, dislocation, or joint effusion. Ankle mortise is preserved. There is a small plantar calcaneal spur. No bony destructive change or significant arthropathy. There is diffuse soft tissue edema. IMPRESSION: Diffuse soft tissue edema.  No acute osseous abnormality. Electronically Signed   By: Jeb Levering M.D.   On: 02/28/2017 04:10   Ct Head Wo Contrast  Result Date: 02/28/2017 CLINICAL DATA:  76 year old female with seizures. EXAM: CT HEAD WITHOUT CONTRAST TECHNIQUE: Contiguous axial images were obtained from the base of the skull through the vertex without intravenous contrast. COMPARISON:  Brain MRI dated 05/05/2016 FINDINGS: Brain: There is moderate brain atrophy and chronic microvascular ischemic changes. An area of old infarct and encephalomalacia noted in the right frontal lobe. Small bilateral basal ganglia old lacunar infarct noted. There is no acute intracranial hemorrhage. No mass effect or midline shift noted. No extra-axial fluid collection. Vascular: No hyperdense vessel or unexpected calcification. Skull: Normal. Negative for fracture or focal lesion. Sinuses/Orbits: No acute finding. Other: None IMPRESSION: 1. No acute intracranial hemorrhage. 2. Moderate age-related atrophy and chronic microvascular ischemic changes. Old right frontal infarct. Electronically Signed   By: Anner Crete M.D.   On: 02/28/2017 03:38   Dg Femur Min 2 Views Left  Result Date: 02/28/2017 CLINICAL DATA:  Seizure. EXAM: LEFT FEMUR 2 VIEWS COMPARISON:  None. FINDINGS: Diffuse osteopenia. Moderately displaced intertrochanteric fracture of the left femur with exaggerated coxa vera deformity about the fracture site. Mild degenerate changes of the left hip and knee. Calcified plaque over the left femoral artery.  IMPRESSION: Moderately displaced left intertrochanteric fracture. Electronically Signed   By: Marin Olp M.D.   On: 02/28/2017 07:24    Labs:  Recent Labs  02/26/17 1258 02/28/17 0308  WBC 8.5 11.0*  RBC 4.17 4.17  HCT 35.5* 35.7*  PLT 167 231    Recent Labs  02/27/17 0357 02/28/17 0308  NA 136 139  K 3.9 4.1  CL 109 106  CO2 19* 21*  BUN 12 15  CREATININE  0.72 1.30*  GLUCOSE 107* 208*  CALCIUM 8.8* 9.2    Recent Labs  02/26/17 1444  INR 1.16    Review of Systems: ROS  Physical Exam: Neurologically intact ABD soft Sensation intact distally No cellulitis present Compartment soft Left leg is shorted and externally rotated  Assessment and Plan: Surgical intervention will be performed by Dr. [name] on Monday Pt needs to be NPO after midnight on Sunday  Worthington Cruzan, [facility], MD [place] [phone]

## 2017-02-28 NOTE — ED Notes (Signed)
Report given to Lauren RN.

## 2017-02-28 NOTE — ED Notes (Signed)
Patient EKG was done and seen by Dr Christy Gentles. Patient on 12 lead at this time.

## 2017-03-01 ENCOUNTER — Encounter (HOSPITAL_COMMUNITY): Payer: Self-pay

## 2017-03-01 ENCOUNTER — Inpatient Hospital Stay (HOSPITAL_COMMUNITY): Payer: Medicare Other

## 2017-03-01 LAB — CBC WITH DIFFERENTIAL/PLATELET
Basophils Absolute: 0 10*3/uL (ref 0.0–0.1)
Basophils Relative: 0 %
Eosinophils Absolute: 0 10*3/uL (ref 0.0–0.7)
Eosinophils Relative: 0 %
HEMATOCRIT: 24.8 % — AB (ref 36.0–46.0)
Hemoglobin: 8 g/dL — ABNORMAL LOW (ref 12.0–15.0)
LYMPHS PCT: 23 %
Lymphs Abs: 1.6 10*3/uL (ref 0.7–4.0)
MCH: 28 pg (ref 26.0–34.0)
MCHC: 32.3 g/dL (ref 30.0–36.0)
MCV: 86.7 fL (ref 78.0–100.0)
MONO ABS: 0.5 10*3/uL (ref 0.1–1.0)
MONOS PCT: 7 %
Neutro Abs: 5 10*3/uL (ref 1.7–7.7)
Neutrophils Relative %: 70 %
Platelets: 109 10*3/uL — ABNORMAL LOW (ref 150–400)
RBC: 2.86 MIL/uL — ABNORMAL LOW (ref 3.87–5.11)
RDW: 16 % — AB (ref 11.5–15.5)
WBC: 7.1 10*3/uL (ref 4.0–10.5)

## 2017-03-01 LAB — PROTIME-INR
INR: 1.28
Prothrombin Time: 16.1 seconds — ABNORMAL HIGH (ref 11.4–15.2)

## 2017-03-01 LAB — COMPREHENSIVE METABOLIC PANEL
ALBUMIN: 2.8 g/dL — AB (ref 3.5–5.0)
ALT: 15 U/L (ref 14–54)
ANION GAP: 7 (ref 5–15)
AST: 31 U/L (ref 15–41)
Alkaline Phosphatase: 50 U/L (ref 38–126)
BUN: 13 mg/dL (ref 6–20)
CHLORIDE: 110 mmol/L (ref 101–111)
CO2: 22 mmol/L (ref 22–32)
Calcium: 8.3 mg/dL — ABNORMAL LOW (ref 8.9–10.3)
Creatinine, Ser: 0.89 mg/dL (ref 0.44–1.00)
GFR calc Af Amer: >60 mL/min (ref >60)
GFR calc non Af Amer: >60 mL/min (ref >60)
GLUCOSE: 93 mg/dL (ref 65–99)
POTASSIUM: 3.6 mmol/L (ref 3.5–5.1)
SODIUM: 139 mmol/L (ref 135–145)
Total Bilirubin: 0.6 mg/dL (ref 0.3–1.2)
Total Protein: 5.1 g/dL — ABNORMAL LOW (ref 6.5–8.1)

## 2017-03-01 LAB — HIV ANTIBODY (ROUTINE TESTING W REFLEX): HIV SCREEN 4TH GENERATION: NONREACTIVE

## 2017-03-01 LAB — LACTIC ACID, PLASMA: Lactic Acid, Venous: 0.7 mmol/L (ref 0.5–1.9)

## 2017-03-01 LAB — MAGNESIUM: MAGNESIUM: 1.8 mg/dL (ref 1.7–2.4)

## 2017-03-01 LAB — GLUCOSE, CAPILLARY: GLUCOSE-CAPILLARY: 91 mg/dL (ref 65–99)

## 2017-03-01 LAB — RPR: RPR Ser Ql: NONREACTIVE

## 2017-03-01 MED ORDER — METHOCARBAMOL 500 MG PO TABS: 500.0000 mg | ORAL_TABLET | Freq: Four times a day (QID) | ORAL | Status: DC | PRN

## 2017-03-01 MED ORDER — POLYETHYLENE GLYCOL 3350 17 G PO PACK
17.0000 g | PACK | Freq: Every day | ORAL | Status: DC
  Administered 2017-03-01 – 2017-03-05 (×4): 17 g via ORAL
  Filled 2017-03-01 (×5): qty 1

## 2017-03-01 MED ORDER — DOCUSATE SODIUM 100 MG PO CAPS
100.0000 mg | ORAL_CAPSULE | Freq: Two times a day (BID) | ORAL | Status: DC
  Administered 2017-03-01 – 2017-03-05 (×9): 100 mg via ORAL
  Filled 2017-03-01 (×9): qty 1

## 2017-03-01 MED ORDER — STROKE: EARLY STAGES OF RECOVERY BOOK
Freq: Once | Status: AC
  Administered 2017-03-01: 15:00:00
  Filled 2017-03-01: qty 1

## 2017-03-01 MED ORDER — VITAMIN B-12 1000 MCG PO TABS
1000.0000 ug | ORAL_TABLET | Freq: Every day | ORAL | Status: DC
  Administered 2017-03-01 – 2017-03-05 (×5): 1000 ug via ORAL
  Filled 2017-03-01 (×5): qty 1

## 2017-03-01 MED ORDER — FERROUS SULFATE 325 (65 FE) MG PO TABS
325.0000 mg | ORAL_TABLET | Freq: Three times a day (TID) | ORAL | Status: DC
  Administered 2017-03-01 – 2017-03-05 (×10): 325 mg via ORAL
  Filled 2017-03-01 (×11): qty 1

## 2017-03-01 MED ORDER — ASPIRIN EC 325 MG PO TBEC
325.0000 mg | DELAYED_RELEASE_TABLET | Freq: Every day | ORAL | Status: DC
  Administered 2017-03-01 – 2017-03-05 (×5): 325 mg via ORAL
  Filled 2017-03-01 (×5): qty 1

## 2017-03-01 MED ORDER — HYDROCODONE-ACETAMINOPHEN 5-325 MG PO TABS
1.0000 | ORAL_TABLET | Freq: Four times a day (QID) | ORAL | Status: DC | PRN
  Administered 2017-03-01 – 2017-03-03 (×3): 1 via ORAL
  Administered 2017-03-03 – 2017-03-05 (×3): 2 via ORAL
  Filled 2017-03-01 (×3): qty 2
  Filled 2017-03-01 (×2): qty 1
  Filled 2017-03-01 (×4): qty 2

## 2017-03-01 MED ORDER — MORPHINE SULFATE (PF) 2 MG/ML IV SOLN
2.0000 mg | INTRAVENOUS | Status: DC | PRN
  Administered 2017-03-02: 2 mg via INTRAVENOUS
  Filled 2017-03-01: qty 1

## 2017-03-01 MED ORDER — SODIUM CHLORIDE 0.9 % IV SOLN
INTRAVENOUS | Status: AC
  Administered 2017-03-01: 06:00:00 via INTRAVENOUS

## 2017-03-01 MED ORDER — ENOXAPARIN SODIUM 40 MG/0.4ML ~~LOC~~ SOLN
40.0000 mg | SUBCUTANEOUS | Status: DC
  Administered 2017-03-01: 40 mg via SUBCUTANEOUS
  Filled 2017-03-01: qty 0.4

## 2017-03-01 MED ORDER — IOPAMIDOL (ISOVUE-370) INJECTION 76%
INTRAVENOUS | Status: AC
  Administered 2017-03-01: 50 mL
  Filled 2017-03-01: qty 50

## 2017-03-01 MED ORDER — HYDROCODONE-ACETAMINOPHEN 5-325 MG PO TABS
1.0000 | ORAL_TABLET | ORAL | Status: DC | PRN
  Administered 2017-03-01: 2 via ORAL
  Filled 2017-03-01: qty 2

## 2017-03-01 MED ORDER — METHOCARBAMOL 1000 MG/10ML IJ SOLN: 500.0000 mg | Freq: Four times a day (QID) | INTRAMUSCULAR | Status: DC | PRN

## 2017-03-01 NOTE — Progress Notes (Signed)
EEG Completed; Results Pending  

## 2017-03-01 NOTE — Progress Notes (Signed)
[name]  MRN: [id number] DOB/Age: Jan 15, 1941 76 y.o. Physician: [place] Procedure:      Subjective: Just back from MRI. Awakens and easily aroused, asking appropriate questions. Hip pain as expected  Vital Signs Temp:  [98 F (36.7 C)-98.9 F (37.2 C)] 98.9 F (37.2 C) (06/24 0015) Pulse Rate:  [69-92] 69 (06/24 0015) Resp:  [16-20] 16 (06/24 0015) BP: (112-129)/(42-47) 128/47 (06/24 0800) SpO2:  [100 %] 100 % (06/24 0015) Weight:  [59 kg (130 lb 1.1 oz)] 59 kg (130 lb 1.1 oz) (06/23 1300)  Lab Results  Recent Labs  02/28/17 0308 03/01/17 0431  WBC 11.0* 7.1  HGB 11.8* 8.0*  HCT 35.7* 24.8*  PLT 231 109*   BMET  Recent Labs  02/28/17 0308 03/01/17 0431  NA 139 139  K 4.1 3.6  CL 106 110  CO2 21* 22  GLUCOSE 208* 93  BUN 15 13  CREATININE 1.30* 0.89  CALCIUM 9.2 8.3*   INR  Date Value Ref Range Status  03/01/2017 1.28  Final     Exam Left leg padded in position of comfort, but shortened and internall rotated Moves toes and foot and has good distal pulse        Plan Current medical workup ongoing Dr. [name] to manage hip surgically once medically cleared Will follow  [facility] PA-C   03/01/2017, 9:23 AM Contact # [phone]

## 2017-03-01 NOTE — Procedures (Signed)
History: 76 year old female who presented with new onset seizures in the setting of stroke.  Sedation: None  Technique: This is a 21 channel routine scalp EEG performed at the bedside with bipolar and monopolar montages arranged in accordance to the international 10/20 system of electrode placement. One channel was dedicated to EKG recording.    Background: The background consists of intermixed alpha and beta activities. There is a well defined posterior dominant rhythm of 9 Hz that attenuates with eye opening. Sleep is not recorded.  Photic stimulation: Physiologic driving is not performed  EEG Abnormalities: None  Clinical Interpretation: This normal EEG is recorded in the waking state. There was no seizure or seizure predisposition recorded on this study. Please note that a normal EEG does not preclude the possibility of epilepsy.   Roland Rack, MD [facility] [phone]  If 7pm- 7am, please page neurology on call as listed in [place].

## 2017-03-01 NOTE — Evaluation (Signed)
Clinical/Bedside Swallow Evaluation Patient Details  Name: [name] MRN: [id number] Date of Birth: November 02, 1940  Today's Date: 03/01/2017 Time: SLP Start Time (ACUTE ONLY): 1028 SLP Stop Time (ACUTE ONLY): 1047 SLP Time Calculation (min) (ACUTE ONLY): 19 min  Past Medical History:  Past Medical History:  Diagnosis Date  . Anemia   . Aortic insufficiency    Moderate  . Back pain   . Carotid stenosis, bilateral   . Cirrhosis ([place])   . Coronary artery disease    Stent x 2 RCA 1995, cardiac catheterization 09/2016 showing only mild atherosclerosis  . DDD (degenerative disc disease), lumbar   . Essential hypertension   . GERD (gastroesophageal reflux disease)   . Hiatal hernia   . History of stroke   . Hypercholesteremia   . IBS (irritable bowel syndrome)   . Myocardial infarction ([place]) 1995  . Non-alcoholic fatty liver disease   . OSA (obstructive sleep apnea)    Sleep study 2009  AHI 9.91/hr and during REM sleep 35.58/hr  . Pericardial effusion    a. small by echo 2018.  Marland Kitchen PSVT (paroxysmal supraventricular tachycardia) ([place])   . Recurrent UTI   . Type 2 diabetes mellitus ([place])   . Varices, esophageal (H[facility])    Past Surgical History:  Past Surgical History:  Procedure Laterality Date  . [place]  . APPENDECTOMY    . BACK SURGERY  1985  . CARDIAC CATHETERIZATION  [phone]   Stent to the proximal RCA after MI   . CARDIAC CATHETERIZATION N/A 09/26/2016   Procedure: Left Heart Cath and Coronary Angiography;  Surgeon: Leonie Man, MD;  Location: [place] [facility] [facility];  Service: Cardiovascular;  Laterality: N/A;  . CATARACT EXTRACTION W/PHACO Right 07/04/2014   Procedure: CATARACT EXTRACTION PHACO AND INTRAOCULAR LENS PLACEMENT (IOC);  Surgeon: Elta Guadeloupe T. Gershon Crane, MD;  [facility];  Service: Ophthalmology;  Laterality: Right;  CDE:13.13  . COLONOSCOPY    . DILATION AND CURETTAGE OF UTERUS     x2  . Epi Retinal Membrane Peel Left   . ERCP    .  ESOPHAGEAL BANDING N/A 04/01/2013   Procedure: ESOPHAGEAL BANDING;  Surgeon: Rogene Houston, MD;  [facility];  Service: Endoscopy;  Laterality: N/A;  . ESOPHAGEAL BANDING N/A 05/24/2013   Procedure: ESOPHAGEAL BANDING;  Surgeon: Rogene Houston, MD;  [facility];  Service: Endoscopy;  Laterality: N/A;  . ESOPHAGEAL BANDING N/A 06/21/2014   Procedure: ESOPHAGEAL BANDING;  Surgeon: Rogene Houston, MD;  [facility];  Service: Endoscopy;  Laterality: N/A;  . ESOPHAGOGASTRODUODENOSCOPY N/A 04/01/2013   Procedure: ESOPHAGOGASTRODUODENOSCOPY (EGD);  Surgeon: Rogene Houston, MD;  [facility];  Service: Endoscopy;  Laterality: N/A;  230-rescheduled to 8:30am Ann notified pt  . ESOPHAGOGASTRODUODENOSCOPY N/A 05/24/2013   Procedure: ESOPHAGOGASTRODUODENOSCOPY (EGD);  Surgeon: Rogene Houston, MD;  [facility];  Service: Endoscopy;  Laterality: N/A;  730  . ESOPHAGOGASTRODUODENOSCOPY N/A 06/21/2014   Procedure: ESOPHAGOGASTRODUODENOSCOPY (EGD);  Surgeon: Rogene Houston, MD;  [facility];  Service: Endoscopy;  Laterality: N/A;  930-rescheduled 10/14 @ 1200 Ann to notify pt  . EYE SURGERY  08   cataract surgery of the left eye  . HARDWARE REMOVAL Right 01/17/2013   Procedure: REMOVAL OF HARDWARE AND EXCISION ULNAR STYLOID RIGHT WRIST;  Surgeon: Tennis Must, MD;  Location: [place];  Service: Orthopedics;  Laterality: Right;  . MYRINGOTOMY  2012  both ears  . TONSILLECTOMY    . VAGINAL HYSTERECTOMY  1972  . WRIST SURGERY     rt wrist hardwear removal   HPI:  [name] an 76 y.o.femalewith hx of prior CVA, recent admission for atypical chest pain with chronic troponin elevations, hx of anemia, non obstructive carotid disease, and known CAD s/p RCA stent, with cath Jan 2018 showing non obstructive CAD, discharged yesterday for a chest pain admission, brought to the ER as she was having status epilepticus. In  the ER, she had focal seizures, and was given Ativan and subsequently loaded with IV Dilantin. Her further work up included a head CT which showed old lacunar infarct but no acute process. Labs showed Hb of 11 grams per dL, and WBC of 11K. Her Cr was 1.3, and her troponin was 0.38 (baseline). Noting her left leg was shorter and externally rotated, Xray showed acute left intertroch Fx. Plan for surgical repait tomorrow.  Her EKG showed NSR with 1 degree AVB, no acute ischemic or injurious changes. Hospitalist was asked to admit her for new onset of seizure, acute left hip Fx, chronic elevation of troponin, and mild AKI.   MRI of the head is showing punctate foci of abnormal diffusion signal in the left precentral gyrus, left corona radiata and the right thalamus c/w acute/subacute embolic infarcts.  Chest xray is showing mild cardiomegaly and left base atelectasis.     Assessment / Plan / Recommendation Clinical Impression  Clinical swallowing evaluation was completed using thin liquids, pureed material and dry solids.  Oral mechanism exam was completed and remarkable for mild left sided facial droop/weakness.   The patient had no complaints of any trouble swallowing prior to admission.  Given oral intake the patient's oral and pharyngeal swallow appeared to be functional.  Swallow trigger appeared to be timely and mastication of dry solids appeared to be functional.  Laryngeal elevation was appreciated.  No overt s/s of aspiration were seen. In addition, the patient passed a 3 ounce water challenge suggesting the low likelihood for aspiration.  Recommend that the patient resume a regular diet with thin liquids.  ST will follow up for therapeutic diet tolerance in setting of new CVA and new onset seizures.   SLP Visit Diagnosis: Dysphagia, oropharyngeal phase (R13.12)    Aspiration Risk  Mild aspiration risk    Diet Recommendation   Regular with thin liquids  Medication Administration: Whole meds  with liquid    Other  Recommendations Oral Care Recommendations: Oral care BID   Follow up Recommendations Other (comment) (TBD)      Frequency and Duration min 2x/week  2 weeks       Prognosis Prognosis for Safe Diet Advancement: Good      Swallow Study   General Date of Onset: 02/28/17 HPI: Shaterra Sanzone an 76 y.o.femalewith hx of prior CVA, recent admission for atypical chest pain with chronic troponin elevations, hx of anemia, non obstructive carotid disease, and known CAD s/p RCA stent, with cath Jan 2018 showing non obstructive CAD, discharged yesterday for a chest pain admission, brought to the ER as she was having status epilepticus. In the ER, she had focal seizures, and was given Ativan and subsequently loaded with IV Dilantin. Her further work up included a head CT which showed old lacunar infarct but no acute process. Labs showed Hb of 11 grams per dL, and WBC of 11K. Her Cr was 1.3, and her troponin was 0.38 (baseline). Noting her left leg was shorter  and externally rotated, Xray showed acute left intertroch Fx. Plan for surgical repait tomorrow.  Her EKG showed NSR with 1 degree AVB, no acute ischemic or injurious changes. Hospitalist was asked to admit her for new onset of seizure, acute left hip Fx, chronic elevation of troponin, and mild AKI.   MRI of the head is showing punctate foci of abnormal diffusion signal in the left precentral gyrus, left corona radiata and the right thalamus c/w acute/subacute embolic infarcts.  Chest xray is showing mild cardiomegaly and left base atelectasis.   Type of Study: Bedside Swallow Evaluation Previous Swallow Assessment: None noted.   Diet Prior to this Study: NPO Temperature Spikes Noted: No Respiratory Status: Room air History of Recent Intubation: No Behavior/Cognition: Alert;Cooperative;Pleasant mood Oral Cavity Assessment: Within Functional Limits Oral Care Completed by SLP: No Oral Cavity - Dentition: Adequate natural  dentition Vision: Functional for self-feeding Self-Feeding Abilities: Able to feed self Patient Positioning: Partially reclined (due to pain) Baseline Vocal Quality: Normal Volitional Cough: Weak Volitional Swallow: Able to elicit    Oral/Motor/Sensory Function Overall Oral Motor/Sensory Function: Mild impairment Facial ROM: Reduced left Facial Symmetry: Abnormal symmetry left Facial Strength: Within Functional Limits Lingual ROM: Within Functional Limits Lingual Symmetry: Within Functional Limits Lingual Strength: Within Functional Limits Mandible: Within Functional Limits   Ice Chips Ice chips: Not tested   Thin Liquid Thin Liquid: Within functional limits Presentation: Cup;Self Fed;Spoon;Straw    Nectar Thick Nectar Thick Liquid: Not tested   Honey Thick Honey Thick Liquid: Not tested   Puree Puree: Within functional limits Presentation: Spoon;Self Fed   Solid   GO   Solid: Within functional limits Presentation: [place], [place], [place] Acute Rehab SLP [phone] Lamar Sprinkles 03/01/2017,10:55 AM

## 2017-03-01 NOTE — Progress Notes (Signed)
[facility] Progress Note  Patient: [name] TKZ:[id number]   PCP: Redmond School, MD DOB: [birth year]   DOA: 02/28/2017   DOS: 03/01/2017   Date of Service: the patient was seen and examined on 03/01/2017  Subjective: Feeling better but complains about significant pain in the hip. Does not remember yesterday's event does not remember old brought into the hospital. Family at bedside mentions that she had just very mild jerking of the whole body starting with her right arm. She had an episode of staring prior to this event as well.  Brief hospital course: Pt. with PMH of CVA, anemia, CAD; admitted on 02/28/2017, presented with complaint of seizure, was found to have acute onset seizure as well as CVA as well as left hip fracture. Currently further plan is stabilization and medical clearance prior to operative management of hip fracture.  Assessment and Plan: 1. Acute seizure. Onset unclear. Whether seizures precipitated the stroke order the stroke precipitated the seizure. Started on Dilantin, pharmacy monitoring the dose. EEG negative for any focus of seizure. MRI brain shows multiple punctate stroke. No mass. No further seizure episodes while she is in the hospital. Outpatient neurology input. Next and we'll monitor.  2. Acute CVA. Past medical history of CVA on aspirin 243 mg daily. Also on Lipitor. MRI brain shows punctate foci of abnormal diffusion in the left precentral gyrus, left coronary radiata, right thalamus, acute subacute embolic infarct. CT angiogram of the head and neck shows 75% left ICA stenosis, severe right and moderate left ECA stenosis, 50% subclavian stenosis as well as moderate right PCA stenosis. Awaiting recommendation from neurology. Currently on 325 mg aspirin. Passed swallowing evaluation by speech therapy on cardiac diet. PTOT consulted but most likely will not be able to work with the patient and his hip has been repaired.  3. Left hip  fracture. Patient had a fall along with the seizure which led to a left hip fracture. Orthopedic has been consulted. Given the acute stroke with possible embolic phenomenon would like to get neurological clearance prior to proceeding with the surgery. Patient most likely will need echocardiogram, TEE for further workup. We will monitor for clearance. Continue pain control.  4. Coronary artery disease. SVT. Hypertension. On lisinopril, Imdur, Coreg and Cardizem at home. Currently blood pressure controlled. Monitor. Recently admitted for chest pain and ruled out for ACS.  Diet: Cardiac diet DVT Prophylaxis: subcutaneous Heparin  Advance goals of care discussion: Full code  Family Communication: family was present at bedside, at the time of interview. The pt provided permission to discuss medical plan with the family. Opportunity was given to ask question and all questions were answered satisfactorily.   Disposition:  Discharge to SNF.  Consultants: Orthopedics, neurology Procedures: EEG  Antibiotics: Anti-infectives    None       Objective: Physical Exam: Vitals:   03/01/17 1000 03/01/17 1200 03/01/17 1400 03/01/17 1724  BP: (!) 108/38 (!) 106/48 (!) 113/54   Pulse: 75 68 75 89  Resp: 18     Temp:    98.7 F (37.1 C)  TempSrc:      SpO2:    99%  Weight:      Height:        Intake/Output Summary (Last 24 hours) at 03/01/17 1728 Last data filed at 03/01/17 0500  Gross per 24 hour  Intake              120 ml  Output  575 ml  Net             -455 ml   Filed Weights   02/28/17 1300  Weight: 59 kg (130 lb 1.1 oz)   General: Alert, Awake and Oriented to Time, Place and Person. Appear in  distress, affect appropriate Eyes: PERRL, Conjunctiva normal ENT: Oral Mucosa clear moist. Neck: no JVD, no Abnormal Mass Or lumps Cardiovascular: S1 and S2 Present, no Murmur, Peripheral Pulses Present Respiratory: normal respiratory effort, Bilateral Air entry  equal and Decreased, no use of accessory muscle, Clear to Auscultation, no Crackles, no wheezes Abdomen: Bowel Sound present, Soft and no tenderness, n hernia Skin: no redness, no Rash, no induration Extremities: no Pedal edema, no calf tenderness Neurologic: Grossly no focal neuro deficit. Bilaterally Equal motor strength  Data Reviewed: CBC:  Recent Labs Lab 02/26/17 1258 02/28/17 0308 03/01/17 0431  WBC 8.5 11.0* 7.1  NEUTROABS  --  7.6 5.0  HGB 11.6* 11.8* 8.0*  HCT 35.5* 35.7* 24.8*  MCV 85.1 85.6 86.7  PLT 167 231 563*   Basic Metabolic Panel:  Recent Labs Lab 02/26/17 1258 02/27/17 0357 02/28/17 0308 03/01/17 0431  NA 138 136 139 139  K 3.2* 3.9 4.1 3.6  CL 107 109 106 110  CO2 19* 19* 21* 22  GLUCOSE 194* 107* 208* 93  BUN 19 12 15 13   CREATININE 0.95 0.72 1.30* 0.89  CALCIUM 9.2 8.8* 9.2 8.3*  MG  --  1.4* 2.0 1.8    Liver Function Tests:  Recent Labs Lab 02/28/17 0308 03/01/17 0431  AST 32 31  ALT 20 15  ALKPHOS 76 50  BILITOT 0.4 0.6  PROT 7.1 5.1*  ALBUMIN 4.0 2.8*   No results for input(s): LIPASE, AMYLASE in the last 168 hours.  Recent Labs Lab 02/28/17 1029  AMMONIA 15   Coagulation Profile:  Recent Labs Lab 02/26/17 1444 03/01/17 0431  INR 1.16 1.28   Cardiac Enzymes:  Recent Labs Lab 02/26/17 1258 02/26/17 1610 02/26/17 2150 02/27/17 0357  TROPONINI 0.32* 0.35* 0.35* 0.38*   BNP (last 3 results) No results for input(s): PROBNP in the last 8760 hours. CBG:  Recent Labs Lab 02/26/17 2233 02/27/17 0733 02/27/17 1110 02/28/17 0242 03/01/17 0320  GLUCAP 163* 110* 147* 175* 91   Studies: Ct Angio Head W/cm &/or Wo Cm  Result Date: 03/01/2017 CLINICAL DATA:  Small bilateral embolic infarcts on MRI earlier today. New onset seizures. EXAM: CT ANGIOGRAPHY HEAD AND NECK TECHNIQUE: Multidetector CT imaging of the head and neck was performed using the standard protocol during bolus administration of intravenous  contrast. Multiplanar CT image reconstructions and MIPs were obtained to evaluate the vascular anatomy. Carotid stenosis measurements (when applicable) are obtained utilizing NASCET criteria, using the distal internal carotid diameter as the denominator. CONTRAST:  50 mL Isovue 370 COMPARISON:  Head MRI earlier today. Head MRA 05/05/2016. Carotid Doppler 04/10/2016. FINDINGS: CT HEAD FINDINGS Brain: The tiny bilateral embolic infarcts on today's earlier MRI are not well seen by CT. A chronic posterior right frontal infarct is again noted in the operculum. There are dilated perivascular spaces versus chronic lacunar infarcts in the basal ganglia bilaterally. Chronic infarcts are also noted in the right cerebellum and left thalamus. No acute large territory infarct, intracranial hemorrhage, mass, midline shift, or extra-axial fluid collection is identified. Mild cerebral atrophy and mild chronic small vessel ischemic white matter disease are noted. Vascular: Calcified atherosclerosis at the skullbase. Skull: No fracture focal osseous lesion. Sinuses: Paranasal sinuses  and mastoid air cells are clear. Orbits: Bilateral cataract extraction. Review of the MIP images confirms the above findings CTA NECK FINDINGS Aortic arch: Standard 3 vessel aortic arch with moderate atherosclerotic plaque. Widely patent brachiocephalic and right subclavian arteries. Approximately 50% stenosis of the left subclavian artery at its origin due to calcified and soft plaque. Right carotid system: Patent with moderate calcified plaque at the carotid bifurcation resulting in moderate to severe ECA origin stenosis. No significant common or internal carotid artery stenosis. Left carotid system: Mild calcified plaque at the common carotid artery origin without definite significant stenosis identified within limitations of motion artifact. Moderate calcified plaque at the carotid bifurcation results in approximately 75% ICA origin stenosis. There  is also moderate ECA origin stenosis. Vertebral arteries: Patent and codominant without evidence of significant stenosis. Skeleton: Advanced cervical disc and facet degeneration. Grade 1 retrolisthesis of C3 on C4 and C5 on C6 and grade 1 anterolisthesis of C4 on C5 and C7 on T1. Other neck: Partially calcified 1 cm left thyroid nodule. Upper chest: Clear lung apices. Review of the MIP images confirms the above findings CTA HEAD FINDINGS Anterior circulation: The internal carotid arteries are patent from skullbase to carotid termini with relatively diffuse siphon atherosclerosis. There is mild supraclinoid stenosis bilaterally. ACAs and MCAs are patent without evidence of proximal branch occlusion or significant proximal stenosis. No aneurysm. Posterior circulation: Intracranial vertebral arteries are patent to the basilar. There is left greater than right V4 segment atherosclerotic plaque which results in mild stenosis on the left. Patent bilateral PICAs and SCAs. Basilar artery is patent without stenosis. Posterior communicating arteries are not identified. PCAs are patent with a moderate to severe P2 stenosis noted on the right, not significantly changed from the prior MRA. No significant proximal left PCA stenosis. No aneurysm. Venous sinuses: Patent. Anatomic variants: None. Delayed phase: No abnormal enhancement. Review of the MIP images confirms the above findings IMPRESSION: 1. Intracranial atherosclerosis without proximal branch occlusion. Mild bilateral intracranial ICA stenosis and moderate to severe right P2 PCA stenosis. 2. 75% proximal left ICA stenosis. 3. Moderate to severe right and moderate left external carotid artery origin stenosis. 4. 50% proximal left subclavian artery stenosis. 5.  Aortic Atherosclerosis (ICD10-I70.0). Electronically Signed   By: Logan Bores M.D.   On: 03/01/2017 16:19   Ct Angio Neck W/cm &/or Wo/cm  Result Date: 03/01/2017 CLINICAL DATA:  Small bilateral embolic  infarcts on MRI earlier today. New onset seizures. EXAM: CT ANGIOGRAPHY HEAD AND NECK TECHNIQUE: Multidetector CT imaging of the head and neck was performed using the standard protocol during bolus administration of intravenous contrast. Multiplanar CT image reconstructions and MIPs were obtained to evaluate the vascular anatomy. Carotid stenosis measurements (when applicable) are obtained utilizing NASCET criteria, using the distal internal carotid diameter as the denominator. CONTRAST:  50 mL Isovue 370 COMPARISON:  Head MRI earlier today. Head MRA 05/05/2016. Carotid Doppler 04/10/2016. FINDINGS: CT HEAD FINDINGS Brain: The tiny bilateral embolic infarcts on today's earlier MRI are not well seen by CT. A chronic posterior right frontal infarct is again noted in the operculum. There are dilated perivascular spaces versus chronic lacunar infarcts in the basal ganglia bilaterally. Chronic infarcts are also noted in the right cerebellum and left thalamus. No acute large territory infarct, intracranial hemorrhage, mass, midline shift, or extra-axial fluid collection is identified. Mild cerebral atrophy and mild chronic small vessel ischemic white matter disease are noted. Vascular: Calcified atherosclerosis at the skullbase. Skull: No fracture focal osseous lesion.  Sinuses: Paranasal sinuses and mastoid air cells are clear. Orbits: Bilateral cataract extraction. Review of the MIP images confirms the above findings CTA NECK FINDINGS Aortic arch: Standard 3 vessel aortic arch with moderate atherosclerotic plaque. Widely patent brachiocephalic and right subclavian arteries. Approximately 50% stenosis of the left subclavian artery at its origin due to calcified and soft plaque. Right carotid system: Patent with moderate calcified plaque at the carotid bifurcation resulting in moderate to severe ECA origin stenosis. No significant common or internal carotid artery stenosis. Left carotid system: Mild calcified plaque at the  common carotid artery origin without definite significant stenosis identified within limitations of motion artifact. Moderate calcified plaque at the carotid bifurcation results in approximately 75% ICA origin stenosis. There is also moderate ECA origin stenosis. Vertebral arteries: Patent and codominant without evidence of significant stenosis. Skeleton: Advanced cervical disc and facet degeneration. Grade 1 retrolisthesis of C3 on C4 and C5 on C6 and grade 1 anterolisthesis of C4 on C5 and C7 on T1. Other neck: Partially calcified 1 cm left thyroid nodule. Upper chest: Clear lung apices. Review of the MIP images confirms the above findings CTA HEAD FINDINGS Anterior circulation: The internal carotid arteries are patent from skullbase to carotid termini with relatively diffuse siphon atherosclerosis. There is mild supraclinoid stenosis bilaterally. ACAs and MCAs are patent without evidence of proximal branch occlusion or significant proximal stenosis. No aneurysm. Posterior circulation: Intracranial vertebral arteries are patent to the basilar. There is left greater than right V4 segment atherosclerotic plaque which results in mild stenosis on the left. Patent bilateral PICAs and SCAs. Basilar artery is patent without stenosis. Posterior communicating arteries are not identified. PCAs are patent with a moderate to severe P2 stenosis noted on the right, not significantly changed from the prior MRA. No significant proximal left PCA stenosis. No aneurysm. Venous sinuses: Patent. Anatomic variants: None. Delayed phase: No abnormal enhancement. Review of the MIP images confirms the above findings IMPRESSION: 1. Intracranial atherosclerosis without proximal branch occlusion. Mild bilateral intracranial ICA stenosis and moderate to severe right P2 PCA stenosis. 2. 75% proximal left ICA stenosis. 3. Moderate to severe right and moderate left external carotid artery origin stenosis. 4. 50% proximal left subclavian artery  stenosis. 5.  Aortic Atherosclerosis (ICD10-I70.0). Electronically Signed   By: Logan Bores M.D.   On: 03/01/2017 16:19   Mr Brain Wo Contrast  Result Date: 03/01/2017 CLINICAL DATA:  New onset seizures.  Mild left hemiparesis. EXAM: MRI HEAD WITHOUT CONTRAST TECHNIQUE: Multiplanar, multiecho pulse sequences of the brain and surrounding structures were obtained without intravenous contrast. COMPARISON:  Head CT 02/28/2017 and MRI 05/05/2016 FINDINGS: Brain: Punctate foci of hyperintense diffusion weighted signal are present in the ventral right thalamus, posterior left corona radiata, and left precentral gyrus. Reduced ADC cannot be confirmed due to their small size. There is no acute large territory infarct. A chronic right MCA infarct is again noted involving the frontal operculum and insula. Small chronic infarcts are again seen in the right cerebellum and left thalamus. Chronic lacunar infarcts versus dilated perivascular spaces are present in the basal ganglia bilaterally. Small T2 hyperintensities scattered elsewhere in the cerebral white matter and pons are unchanged from the prior MRI and nonspecific but compatible with mild chronic small vessel ischemic disease. No intracranial hemorrhage, mass, midline shift, or extra-axial fluid collection is identified. There is mild cerebral atrophy. Vascular: Major intracranial vascular flow voids are preserved. Skull and upper cervical spine: Unremarkable bone marrow signal. Sinuses/Orbits: Bilateral cataract extraction. Clear paranasal  sinuses. Small bilateral mastoid effusions. Other: None. IMPRESSION: 1. Punctate foci of abnormal diffusion signal in the left precentral gyrus, left corona radiata, and right thalamus compatible with acute/subacute embolic infarcts. 2. Chronic right frontal infarct and small vessel ischemic changes as above. Electronically Signed   By: Logan Bores M.D.   On: 03/01/2017 07:41    Scheduled Meds: . aspirin EC  325 mg Oral Daily   . atorvastatin  40 mg Oral QHS  . carvedilol  25 mg Oral BID WC  . diltiazem  180 mg Oral Daily  . docusate sodium  100 mg Oral BID  . enoxaparin (LOVENOX) injection  40 mg Subcutaneous Q24H  . ferrous sulfate  325 mg Oral TID PC  . folic acid  1 mg Oral Daily  . isosorbide mononitrate  30 mg Oral QHS  . pantoprazole  40 mg Oral Daily  . phenytoin (DILANTIN) IV  100 mg Intravenous Q8H  . polyethylene glycol  17 g Oral Daily  . vitamin B-12  1,000 mcg Oral Daily   Continuous Infusions: . methocarbamol (ROBAXIN)  IV     PRN Meds: HYDROcodone-acetaminophen, methocarbamol **OR** methocarbamol (ROBAXIN)  IV, morphine injection, ondansetron **OR** ondansetron (ZOFRAN) IV  Time spent: 35 minutes  Author: Berle Mull, MD [facility] Pager: [phone] 03/01/2017 5:28 PM  If 7PM-7AM, please contact night-coverage at [email or web address], password [facility]

## 2017-03-01 NOTE — Progress Notes (Signed)
Subjective: MRI shows acute ischemic strokes  Exam: Vitals:   03/01/17 0800 03/01/17 1000  BP: (!) 128/47 (!) 108/38  Pulse:  75  Resp:  18  Temp:     Gen: In bed, NAD Resp: non-labored breathing, no acute distress Abd: soft, nt  Neuro: MS: Awake, alert, oriented CN: Pupils equal and reactive to light, visual fields full, face symmetric Motor: 5/5 strength in bilateral upper extremities, good strength and right lower extremity, left lower extremity limited by pain Sensory: Intact to light touch, no extinction  Pertinent Labs: CMP-unremarkable  Impression: 76 year old female with several subcortical and one cortical stroke. Possible ideologies include embolus, concomittent small vessel disease in the setting of physiological stress from seizure. I don't think that the changes are typical of postictal findings, I would favor treating these as infarcts. It is possible that the seizure was precipitated by the stroke as opposed to the other way around.  I would favor continued antiepileptic therapy, at least in the intermediate term given her history of cortical encephalomalacia.  Recommendations: 1) CT head and neck 2) EEG 3) A1c, LDL 4) neuro checks 5) PT, OT, ST 6) continue Dilantin  Roland Rack, MD [facility] [phone]  If 7pm- 7am, please page neurology on call as listed in [place].

## 2017-03-02 ENCOUNTER — Encounter (HOSPITAL_COMMUNITY)
Admission: EM | Disposition: A | Discharge status: Skilled Nursing Facility | Payer: Self-pay | Source: Home / Self Care | Attending: Internal Medicine

## 2017-03-02 ENCOUNTER — Inpatient Hospital Stay (HOSPITAL_COMMUNITY): Payer: Medicare Other | Admitting: Anesthesiology

## 2017-03-02 ENCOUNTER — Encounter (HOSPITAL_COMMUNITY): Payer: Self-pay

## 2017-03-02 ENCOUNTER — Inpatient Hospital Stay (HOSPITAL_COMMUNITY): Payer: Medicare Other

## 2017-03-02 ENCOUNTER — Other Ambulatory Visit (HOSPITAL_COMMUNITY): Payer: Self-pay

## 2017-03-02 DIAGNOSIS — S72102A Unspecified trochanteric fracture of left femur, initial encounter for closed fracture: Secondary | ICD-10-CM | POA: Diagnosis present

## 2017-03-02 HISTORY — PX: FEMUR IM NAIL: SHX1597

## 2017-03-02 LAB — BASIC METABOLIC PANEL
Anion gap: 8 (ref 5–15)
BUN: 18 mg/dL (ref 6–20)
CALCIUM: 8.2 mg/dL — AB (ref 8.9–10.3)
CO2: 21 mmol/L — ABNORMAL LOW (ref 22–32)
Chloride: 108 mmol/L (ref 101–111)
Creatinine, Ser: 0.7 mg/dL (ref 0.44–1.00)
GFR calc Af Amer: >60 mL/min (ref >60)
GLUCOSE: 114 mg/dL — AB (ref 65–99)
POTASSIUM: 3.8 mmol/L (ref 3.5–5.1)
SODIUM: 137 mmol/L (ref 135–145)

## 2017-03-02 LAB — CBC
HCT: 23 % — ABNORMAL LOW (ref 36.0–46.0)
Hemoglobin: 7.4 g/dL — ABNORMAL LOW (ref 12.0–15.0)
MCH: 27.7 pg (ref 26.0–34.0)
MCHC: 32.2 g/dL (ref 30.0–36.0)
MCV: 86.1 fL (ref 78.0–100.0)
PLATELETS: 91 10*3/uL — AB (ref 150–400)
RBC: 2.67 MIL/uL — AB (ref 3.87–5.11)
RDW: 15.8 % — AB (ref 11.5–15.5)
WBC: 7.1 10*3/uL (ref 4.0–10.5)

## 2017-03-02 LAB — LIPID PANEL
CHOL/HDL RATIO: 2.5 ratio
CHOLESTEROL: 98 mg/dL (ref 0–200)
HDL: 39 mg/dL — AB (ref >40)
LDL Cholesterol: 43 mg/dL (ref 0–99)
Triglycerides: 78 mg/dL (ref <150)
VLDL: 16 mg/dL (ref 0–40)

## 2017-03-02 LAB — SURGICAL PCR SCREEN
MRSA, PCR: NEGATIVE
Staphylococcus aureus: NEGATIVE

## 2017-03-02 LAB — GLUCOSE, CAPILLARY
GLUCOSE-CAPILLARY: 115 mg/dL — AB (ref 65–99)
Glucose-Capillary: 123 mg/dL — ABNORMAL HIGH (ref 65–99)
Glucose-Capillary: 128 mg/dL — ABNORMAL HIGH (ref 65–99)

## 2017-03-02 LAB — ABO/RH: ABO/RH(D): A POS

## 2017-03-02 LAB — PREPARE RBC (CROSSMATCH)

## 2017-03-02 SURGERY — INSERTION, INTRAMEDULLARY ROD, FEMUR
Anesthesia: General | Laterality: Left

## 2017-03-02 MED ORDER — OXYCODONE HCL 5 MG/5ML PO SOLN: 5.0000 mg | Freq: Once | ORAL | Status: DC | PRN

## 2017-03-02 MED ORDER — CHLORHEXIDINE GLUCONATE 4 % EX LIQD
60.0000 mL | Freq: Once | CUTANEOUS | Status: DC
Start: 2017-03-02 — End: 2017-03-02
  Filled 2017-03-02: qty 60

## 2017-03-02 MED ORDER — METOCLOPRAMIDE HCL 5 MG/ML IJ SOLN: 5.0000 mg | Freq: Three times a day (TID) | INTRAMUSCULAR | Status: DC | PRN

## 2017-03-02 MED ORDER — ACETAMINOPHEN 325 MG PO TABS: 650.0000 mg | ORAL_TABLET | Freq: Four times a day (QID) | ORAL | Status: DC | PRN

## 2017-03-02 MED ORDER — LACTATED RINGERS IV SOLN
INTRAVENOUS | Status: DC | PRN
  Administered 2017-03-02: 14:00:00 via INTRAVENOUS

## 2017-03-02 MED ORDER — CEFAZOLIN SODIUM-DEXTROSE 2-4 GM/100ML-% IV SOLN
2.0000 g | INTRAVENOUS | Status: AC
  Administered 2017-03-02: 2 g via INTRAVENOUS
  Filled 2017-03-02 (×3): qty 100

## 2017-03-02 MED ORDER — OXYCODONE HCL 5 MG PO TABS: 5.0000 mg | ORAL_TABLET | Freq: Once | ORAL | Status: DC | PRN

## 2017-03-02 MED ORDER — MENTHOL 3 MG MT LOZG: 1.0000 | LOZENGE | OROMUCOSAL | Status: DC | PRN

## 2017-03-02 MED ORDER — ONDANSETRON HCL 4 MG/2ML IJ SOLN: 4.0000 mg | Freq: Once | INTRAMUSCULAR | Status: DC | PRN

## 2017-03-02 MED ORDER — PHENYLEPHRINE HCL 10 MG/ML IJ SOLN
INTRAVENOUS | Status: DC | PRN
  Administered 2017-03-02: 20 ug/min via INTRAVENOUS

## 2017-03-02 MED ORDER — METOCLOPRAMIDE HCL 10 MG PO TABS: 5.0000 mg | ORAL_TABLET | Freq: Three times a day (TID) | ORAL | Status: DC | PRN

## 2017-03-02 MED ORDER — PHENYLEPHRINE 40 MCG/ML (10ML) SYRINGE FOR IV PUSH (FOR BLOOD PRESSURE SUPPORT)
PREFILLED_SYRINGE | INTRAVENOUS | Status: DC | PRN
  Administered 2017-03-02 (×3): 120 ug via INTRAVENOUS

## 2017-03-02 MED ORDER — ENOXAPARIN SODIUM 40 MG/0.4ML ~~LOC~~ SOLN
40.0000 mg | SUBCUTANEOUS | Status: DC
  Administered 2017-03-03 – 2017-03-05 (×3): 40 mg via SUBCUTANEOUS
  Filled 2017-03-02 (×3): qty 0.4

## 2017-03-02 MED ORDER — PHENYLEPHRINE 40 MCG/ML (10ML) SYRINGE FOR IV PUSH (FOR BLOOD PRESSURE SUPPORT)
PREFILLED_SYRINGE | INTRAVENOUS | Status: AC
  Filled 2017-03-02: qty 10

## 2017-03-02 MED ORDER — FENTANYL CITRATE (PF) 100 MCG/2ML IJ SOLN: 25.0000 ug | INTRAMUSCULAR | Status: DC | PRN

## 2017-03-02 MED ORDER — ONDANSETRON HCL 4 MG/2ML IJ SOLN
INTRAMUSCULAR | Status: AC
  Filled 2017-03-02: qty 2

## 2017-03-02 MED ORDER — PROPOFOL 10 MG/ML IV BOLUS
INTRAVENOUS | Status: AC
  Filled 2017-03-02: qty 20

## 2017-03-02 MED ORDER — ROCURONIUM BROMIDE 10 MG/ML (PF) SYRINGE
PREFILLED_SYRINGE | INTRAVENOUS | Status: AC
  Filled 2017-03-02: qty 5

## 2017-03-02 MED ORDER — LACTATED RINGERS IV SOLN
INTRAVENOUS | Status: DC
  Administered 2017-03-02: 14:00:00 via INTRAVENOUS

## 2017-03-02 MED ORDER — 0.9 % SODIUM CHLORIDE (POUR BTL) OPTIME
TOPICAL | Status: DC | PRN
  Administered 2017-03-02: 1000 mL

## 2017-03-02 MED ORDER — SODIUM CHLORIDE 0.9 % IV SOLN: Freq: Once | INTRAVENOUS | Status: DC

## 2017-03-02 MED ORDER — PROPOFOL 10 MG/ML IV BOLUS
INTRAVENOUS | Status: DC | PRN
  Administered 2017-03-02: 20 mg via INTRAVENOUS

## 2017-03-02 MED ORDER — LIDOCAINE 2% (20 MG/ML) 5 ML SYRINGE
INTRAMUSCULAR | Status: AC
  Filled 2017-03-02: qty 5

## 2017-03-02 MED ORDER — MORPHINE SULFATE (PF) 2 MG/ML IV SOLN: 0.5000 mg | INTRAVENOUS | Status: DC | PRN

## 2017-03-02 MED ORDER — FENTANYL CITRATE (PF) 250 MCG/5ML IJ SOLN
INTRAMUSCULAR | Status: AC
  Filled 2017-03-02: qty 5

## 2017-03-02 MED ORDER — POVIDONE-IODINE 10 % EX SWAB: 2.0000 "application " | Freq: Once | CUTANEOUS | Status: DC

## 2017-03-02 MED ORDER — FENTANYL CITRATE (PF) 100 MCG/2ML IJ SOLN
INTRAMUSCULAR | Status: DC | PRN
  Administered 2017-03-02: 50 ug via INTRAVENOUS

## 2017-03-02 MED ORDER — CEFAZOLIN SODIUM-DEXTROSE 2-4 GM/100ML-% IV SOLN
2.0000 g | Freq: Four times a day (QID) | INTRAVENOUS | Status: AC
  Administered 2017-03-02 – 2017-03-03 (×2): 2 g via INTRAVENOUS
  Filled 2017-03-02 (×2): qty 100

## 2017-03-02 MED ORDER — PROPOFOL 500 MG/50ML IV EMUL
INTRAVENOUS | Status: DC | PRN
  Administered 2017-03-02: 75 ug/kg/min via INTRAVENOUS

## 2017-03-02 MED ORDER — LACTATED RINGERS IV SOLN
INTRAVENOUS | Status: DC | PRN
  Administered 2017-03-02: 15:00:00 via INTRAVENOUS

## 2017-03-02 MED ORDER — PHENOL 1.4 % MT LIQD
1.0000 | OROMUCOSAL | Status: DC | PRN
Start: 2017-03-02 — End: 2017-03-05

## 2017-03-02 MED ORDER — ACETAMINOPHEN 650 MG RE SUPP: 650.0000 mg | Freq: Four times a day (QID) | RECTAL | Status: DC | PRN

## 2017-03-02 SURGICAL SUPPLY — 50 items
ADH SKN CLS APL DERMABOND .7 (GAUZE/BANDAGES/DRESSINGS) ×2
ALCOHOL ISOPROPYL (RUBBING) (MISCELLANEOUS) ×3 IMPLANT
BAG URINE DRAINAGE (UROLOGICAL SUPPLIES) ×2 IMPLANT
BIT DRILL 4.3MMS DISTAL GRDTED (BIT) IMPLANT
BNDG COHESIVE 4X5 TAN STRL (GAUZE/BANDAGES/DRESSINGS) ×3 IMPLANT
CHLORAPREP W/TINT 26ML (MISCELLANEOUS) ×3 IMPLANT
COVER PERINEAL POST (MISCELLANEOUS) ×3 IMPLANT
COVER SURGICAL LIGHT HANDLE (MISCELLANEOUS) ×3 IMPLANT
DERMABOND ADVANCED (GAUZE/BANDAGES/DRESSINGS) ×4
DERMABOND ADVANCED .7 DNX12 (GAUZE/BANDAGES/DRESSINGS) ×2 IMPLANT
DRAPE C-ARM 42X72 X-RAY (DRAPES) ×3 IMPLANT
DRAPE C-ARMOR (DRAPES) ×3 IMPLANT
DRAPE IMP U-DRAPE 54X76 (DRAPES) ×6 IMPLANT
DRAPE STERI IOBAN 125X83 (DRAPES) ×3 IMPLANT
DRAPE U-SHAPE 47X51 STRL (DRAPES) ×6 IMPLANT
DRAPE UNIVERSAL PACK (DRAPES) ×3 IMPLANT
DRILL 4.3MMS DISTAL GRADUATED (BIT) ×3
DRSG MEPILEX BORDER 4X4 (GAUZE/BANDAGES/DRESSINGS) ×8 IMPLANT
DRSG PAD ABDOMINAL 8X10 ST (GAUZE/BANDAGES/DRESSINGS) ×3 IMPLANT
ELECT REM PT RETURN 9FT ADLT (ELECTROSURGICAL) ×3
ELECTRODE REM PT RTRN 9FT ADLT (ELECTROSURGICAL) ×1 IMPLANT
FACESHIELD WRAPAROUND (MASK) ×3 IMPLANT
FACESHIELD WRAPAROUND OR TEAM (MASK) ×1 IMPLANT
GLOVE BIO SURGEON STRL SZ8.5 (GLOVE) ×6 IMPLANT
GLOVE BIOGEL PI IND STRL 8.5 (GLOVE) ×1 IMPLANT
GLOVE BIOGEL PI INDICATOR 8.5 (GLOVE) ×2
GOWN STRL REUS W/ TWL LRG LVL3 (GOWN DISPOSABLE) ×1 IMPLANT
GOWN STRL REUS W/TWL 2XL LVL3 (GOWN DISPOSABLE) ×5 IMPLANT
GOWN STRL REUS W/TWL LRG LVL3 (GOWN DISPOSABLE) ×3
GUIDEWIRE BALL NOSE 100CM (WIRE) ×2 IMPLANT
HFN LH 130 DEG 9MM X 360MM (Nail) ×2 IMPLANT
HIP FRA NAIL LAG SCREW 10.5X90 (Orthopedic Implant) ×3 IMPLANT
KIT ROOM TURNOVER OR (KITS) ×3 IMPLANT
MANIFOLD NEPTUNE II (INSTRUMENTS) ×3 IMPLANT
MARKER SKIN DUAL TIP RULER LAB (MISCELLANEOUS) ×3 IMPLANT
NS IRRIG 1000ML POUR BTL (IV SOLUTION) ×3 IMPLANT
PACK GENERAL/GYN (CUSTOM PROCEDURE TRAY) ×3 IMPLANT
PAD ARMBOARD 7.5X6 YLW CONV (MISCELLANEOUS) ×6 IMPLANT
PADDING CAST ABS 4INX4YD NS (CAST SUPPLIES) ×2
PADDING CAST ABS COTTON 4X4 ST (CAST SUPPLIES) ×1 IMPLANT
PIN GUIDE 3.2 903003004 (MISCELLANEOUS) ×2 IMPLANT
SCREW BONE CORTICAL 5.0X3 (Screw) ×2 IMPLANT
SCREW LAG HIP FRA NAIL 10.5X90 (Orthopedic Implant) IMPLANT
SCREWDRIVER HEX TIP 3.5MM (MISCELLANEOUS) ×2 IMPLANT
SUT MNCRL AB 3-0 PS2 27 (SUTURE) ×3 IMPLANT
SUT MON AB 2-0 CT1 27 (SUTURE) ×5 IMPLANT
SUT VIC AB 1 CT1 27 (SUTURE) ×3
SUT VIC AB 1 CT1 27XBRD ANBCTR (SUTURE) ×1 IMPLANT
TOWEL OR 17X24 6PK STRL BLUE (TOWEL DISPOSABLE) ×3 IMPLANT
TOWEL OR 17X26 10 PK STRL BLUE (TOWEL DISPOSABLE) ×3 IMPLANT

## 2017-03-02 NOTE — Anesthesia Procedure Notes (Signed)
Spinal  Start time: 03/02/2017 3:35 PM End time: 03/02/2017 3:40 PM Staffing Anesthesiologist: Linna Caprice, Jamone Garrido Performed: anesthesiologist  Preanesthetic Checklist Completed: patient identified, site marked, surgical consent, pre-op evaluation, timeout performed, IV checked, risks and benefits discussed and monitors and equipment checked Spinal Block Patient position: left lateral decubitus Prep: ChloraPrep Patient monitoring: heart rate, cardiac monitor, continuous pulse ox and blood pressure Approach: midline Location: L3-4 Injection technique: single-shot Needle Needle type: Tuohy  Needle gauge: 22 G Needle length: 9 cm Assessment Sensory level: T6 Additional Notes 14 mg 0.75% Bupivacaine injected easily

## 2017-03-02 NOTE — Progress Notes (Signed)
PT Cancellation Note  Patient Details Name: [name] MRN: [id number] DOB: 10-Apr-1941   Cancelled Treatment:    Reason Eval/Treat Not Completed: Patient not medically ready, active bedrest orders at this time. Awaiting clearance for surgery re:hip fx.   Duncan Dull 03/02/2017, 11:31 AM

## 2017-03-02 NOTE — Discharge Instructions (Signed)
Dr. Rod Can Adult Hip & Knee Specialist [facility] [address], [place], [place] [address] [phone]   POSTOPERATIVE DIRECTIONS    Hip Rehabilitation, Guidelines Following Surgery   WEIGHT BEARING Partial weight bearing with assist device as directed.  touch down weight bearing   HOME CARE INSTRUCTIONS  Remove items at home which could result in a fall. This includes throw rugs or furniture in walking pathways.  Continue medications as instructed at time of discharge.  You may have some home medications which will be placed on hold until you complete the course of blood thinner medication.  4 days after discharge, you may start showering. No tub baths or soaking your incisions. Do not put on socks or shoes without following the instructions of your caregivers.   Sit on chairs with arms. Use the chair arms to help push yourself up when arising.  Arrange for the use of a toilet seat elevator so you are not sitting low.   Walk with walker as instructed.  You may resume a sexual relationship in one month or when given the OK by your caregiver.  Use walker as long as suggested by your caregivers.  Avoid periods of inactivity such as sitting longer than an hour when not asleep. This helps prevent blood clots.  You may return to work once you are cleared by Engineer, production.  Do not drive a car for 6 weeks or until released by your surgeon.  Do not drive while taking narcotics.  Wear elastic stockings for two weeks following surgery during the day but you may remove then at night.  Make sure you keep all of your appointments after your operation with all of your doctors and caregivers. You should call the office at the above phone number and make an appointment for approximately two weeks after the date of your surgery. Please pick up a stool softener and laxative for home use as long as you are requiring pain medications.  ICE to the affected hip  every three hours for 30 minutes at a time and then as needed for pain and swelling. Continue to use ice on the hip for pain and swelling from surgery. You may notice swelling that will progress down to the foot and ankle.  This is normal after surgery.  Elevate the leg when you are not up walking on it.   It is important for you to complete the blood thinner medication as prescribed by your doctor.  Continue to use the breathing machine which will help keep your temperature down.  It is common for your temperature to cycle up and down following surgery, especially at night when you are not up moving around and exerting yourself.  The breathing machine keeps your lungs expanded and your temperature down.  RANGE OF MOTION AND STRENGTHENING EXERCISES  These exercises are designed to help you keep full movement of your hip joint. Follow your caregiver's or physical therapist's instructions. Perform all exercises about fifteen times, three times per day or as directed. Exercise both hips, even if you have had only one joint replacement. These exercises can be done on a training (exercise) mat, on the floor, on a table or on a bed. Use whatever works the best and is most comfortable for you. Use music or television while you are exercising so that the exercises are a pleasant break in your day. This will make your life better with the exercises acting as a break in routine you can  look forward to.  Lying on your back, slowly slide your foot toward your buttocks, raising your knee up off the floor. Then slowly slide your foot back down until your leg is straight again.  Lying on your back spread your legs as far apart as you can without causing discomfort.  Lying on your side, raise your upper leg and foot straight up from the floor as far as is comfortable. Slowly lower the leg and repeat.  Lying on your back, tighten up the muscle in the front of your thigh (quadriceps muscles). You can do this by keeping your  leg straight and trying to raise your heel off the floor. This helps strengthen the largest muscle supporting your knee.  Lying on your back, tighten up the muscles of your buttocks both with the legs straight and with the knee bent at a comfortable angle while keeping your heel on the floor.   SKILLED REHAB INSTRUCTIONS: If the patient is transferred to a skilled rehab facility following release from the hospital, a list of the current medications will be sent to the facility for the patient to continue.  When discharged from the skilled rehab facility, please have the facility set up the patient's [place] prior to being released. Also, the skilled facility will be responsible for providing the patient with their medications at time of release from the facility to include their pain medication and their blood thinner medication. If the patient is still at the rehab facility at time of the two week follow up appointment, the skilled rehab facility will also need to assist the patient in arranging follow up appointment in our office and any transportation needs.  MAKE SURE YOU:  Understand these instructions.  Will watch your condition.  Will get help right away if you are not doing well or get worse.  Pick up stool softner and laxative for home use following surgery while on pain medications. Daily dry dressing changes as needed. In 4 days, you may remove your dressings and begin taking showers - no tub baths or soaking the incisions. Continue to use ice for pain and swelling after surgery. Do not use any lotions or creams on the incision until instructed by your surgeon.

## 2017-03-02 NOTE — Anesthesia Preprocedure Evaluation (Signed)
Anesthesia Evaluation  Patient identified by MRN, date of birth, ID band Patient awake    Reviewed: Allergy & Precautions, NPO status , Patient's Chart, lab work & pertinent test results  Airway Mallampati: II  TM Distance: >3 FB Neck ROM: Full    Dental  (+) Teeth Intact, Dental Advisory Given   Pulmonary former smoker,    breath sounds clear to auscultation       Cardiovascular hypertension,  Rhythm:Regular Rate:Normal     Neuro/Psych    GI/Hepatic   Endo/Other  diabetes  Renal/GU      Musculoskeletal   Abdominal   Peds  Hematology   Anesthesia Other Findings   Reproductive/Obstetrics                             Anesthesia Physical Anesthesia Plan  ASA: III  Anesthesia Plan: General   Post-op Pain Management:    Induction: Intravenous  PONV Risk Score and Plan: Ondansetron  Airway Management Planned: Oral ETT  Additional Equipment:   Intra-op Plan:   Post-operative Plan: Extubation in OR  Informed Consent: I have reviewed the patients History and Physical, chart, labs and discussed the procedure including the risks, benefits and alternatives for the proposed anesthesia with the patient or authorized representative who has indicated his/her understanding and acceptance.   Dental advisory given  Plan Discussed with: CRNA and Anesthesiologist  Anesthesia Plan Comments:         Anesthesia Quick Evaluation

## 2017-03-02 NOTE — Progress Notes (Signed)
[facility] Progress Note  Patient: [name] ZOX:[id number]   PCP: Redmond School, MD DOB: 1940-11-10   DOA: 02/28/2017   DOS: 03/02/2017   Date of Service: the patient was seen and examined on 03/02/2017  Subjective: Feeling better Pain well controlled, no nausea no vomiting. No active bleeding reported.  Brief hospital course: Pt. with PMH of CVA, anemia, CAD; admitted on 02/28/2017, presented with complaint of seizure, was found to have acute onset seizure as well as CVA as well as left hip fracture. Currently further plan is stabilization and medical clearance prior to operative management of hip fracture.  Assessment and Plan: 1. Acute seizure. Onset unclear. Whether seizures precipitated the stroke order the stroke precipitated the seizure. Started on Dilantin, pharmacy monitoring the dose. EEG negative for any focus of seizure. MRI brain shows multiple punctate stroke. No mass. No further seizure episodes while she is in the hospital. Outpatient neurology input. Continue Dilantin.  2. Acute CVA. Past medical history of CVA on aspirin 243 mg daily. Also on Lipitor. MRI brain shows punctate foci of abnormal diffusion in the left precentral gyrus, left coronary radiata, right thalamus, acute subacute embolic infarct. CT angiogram of the head and neck shows 75% left ICA stenosis, severe right and moderate left ECA stenosis, 50% subclavian stenosis as well as moderate right PCA stenosis. Neurology feels the patient can be cleared for the surgery. These findings of the MRI are more likely artifact after seizure episode rather than true stroke. Recommend no further workup at present. Currently on 325 mg aspirin. Passed swallowing evaluation by speech therapy on cardiac diet. PTOT consulted but most likely will not be able to work with the patient and his hip has been repaired.  3. Left hip fracture. Acute but does anemia. Patient had a fall along with the seizure which led  to a left hip fracture. Orthopedic has been consulted. Patient was cleared for the surgery by neurology. Hemoglobin significantly low from baseline. No external bleeding reported. Receiving 1 PRBC prior to procedure. Monitor H&H.  4. Coronary artery disease. SVT. Hypertension. On lisinopril, Imdur, Coreg and Cardizem at home. Currently blood pressure controlled. Monitor. Recently admitted for chest pain and ruled out for ACS.  Diet: Cardiac diet DVT Prophylaxis: subcutaneous Heparin  Advance goals of care discussion: Full code  Family Communication: no family was present at bedside, at the time of interview.   Disposition:  Discharge to SNF.  Consultants: Orthopedics, neurology Procedures: EEG  Antibiotics: Anti-infectives    Start     Dose/Rate Route Frequency Ordered Stop   03/02/17 1300  ceFAZolin (ANCEF) IVPB 2g/100 mL premix     2 g 200 mL/hr over 30 Minutes Intravenous To [facility] 03/02/17 0420 03/02/17 1555       Objective: Physical Exam: Vitals:   03/01/17 2304 03/02/17 0510 03/02/17 0900 03/02/17 1710  BP: (!) 117/39 (!) 132/49 (!) 121/44 (!) 147/59  Pulse: 66 83 86 84  Resp: 17 17  15   Temp: 99.8 F (37.7 C) 99.6 F (37.6 C)  97.5 F (36.4 C)  TempSrc: Oral Oral    SpO2: 98% 99%  98%  Weight:      Height:        Intake/Output Summary (Last 24 hours) at 03/02/17 1721 Last data filed at 03/02/17 1700  Gross per 24 hour  Intake             1300 ml  Output  1850 ml  Net             -550 ml   Filed Weights   02/28/17 1300  Weight: 59 kg (130 lb 1.1 oz)   General: Alert, Awake and Oriented to Time, Place and Person. Appear in  distress, affect appropriate Eyes: PERRL, Conjunctiva normal ENT: Oral Mucosa clear moist. Neck: no JVD, no Abnormal Mass Or lumps Cardiovascular: S1 and S2 Present, no Murmur, Peripheral Pulses Present Respiratory: normal respiratory effort, Bilateral Air entry equal and Decreased, no use of  accessory muscle, Clear to Auscultation, no Crackles, no wheezes Abdomen: Bowel Sound present, Soft and no tenderness, n hernia Skin: no redness, no Rash, no induration Extremities: no Pedal edema, no calf tenderness Neurologic: Grossly no focal neuro deficit. Bilaterally Equal motor strength  Data Reviewed: CBC:  Recent Labs Lab 02/26/17 1258 02/28/17 0308 03/01/17 0431 03/02/17 0559  WBC 8.5 11.0* 7.1 7.1  NEUTROABS  --  7.6 5.0  --   HGB 11.6* 11.8* 8.0* 7.4*  HCT 35.5* 35.7* 24.8* 23.0*  MCV 85.1 85.6 86.7 86.1  PLT 167 231 109* 91*   Basic Metabolic Panel:  Recent Labs Lab 02/26/17 1258 02/27/17 0357 02/28/17 0308 03/01/17 0431 03/02/17 0559  NA 138 136 139 139 137  K 3.2* 3.9 4.1 3.6 3.8  CL 107 109 106 110 108  CO2 19* 19* 21* 22 21*  GLUCOSE 194* 107* 208* 93 114*  BUN 19 12 15 13 18   CREATININE 0.95 0.72 1.30* 0.89 0.70  CALCIUM 9.2 8.8* 9.2 8.3* 8.2*  MG  --  1.4* 2.0 1.8  --     Liver Function Tests:  Recent Labs Lab 02/28/17 0308 03/01/17 0431  AST 32 31  ALT 20 15  ALKPHOS 76 50  BILITOT 0.4 0.6  PROT 7.1 5.1*  ALBUMIN 4.0 2.8*   No results for input(s): LIPASE, AMYLASE in the last 168 hours.  Recent Labs Lab 02/28/17 1029  AMMONIA 15   Coagulation Profile:  Recent Labs Lab 02/26/17 1444 03/01/17 0431  INR 1.16 1.28   Cardiac Enzymes:  Recent Labs Lab 02/26/17 1258 02/26/17 1610 02/26/17 2150 02/27/17 0357  TROPONINI 0.32* 0.35* 0.35* 0.38*   BNP (last 3 results) No results for input(s): PROBNP in the last 8760 hours. CBG:  Recent Labs Lab 02/27/17 1110 02/28/17 0242 03/01/17 0320 03/02/17 0815 03/02/17 1238  GLUCAP 147* 175* 91 123* 115*   Studies: Dg C-arm 1-60 Min  Result Date: 03/02/2017 CLINICAL DATA:  Hardware fixation of a comminuted left intertrochanteric fracture. EXAM: DG C-ARM 61-120 MIN COMPARISON:  02/28/2017. FINDINGS: C-arm fluoroscopy was provided in the operating room. 80 seconds of  fluoroscopy was used. See the left femur C-arm views report from today. IMPRESSION: C-arm fluoroscopy provided in the operating room. Electronically Signed   By: Claudie Revering M.D.   On: 03/02/2017 16:48   Dg Femur Min 2 Views Left  Result Date: 03/02/2017 CLINICAL DATA:  Operative fixation of a left intertrochanteric fracture. EXAM: LEFT FEMUR 2 VIEWS COMPARISON:  Left femur radiographs dated 02/28/2017. FINDINGS: Interval compression screw and rod fixation of the previously demonstrated left intertrochanteric fracture with anatomic position and alignment of the major fragments. No additional fractures or dislocation seen. IMPRESSION: Hardware fixation of the previously demonstrated left intratrochanteric fracture with anatomic position and alignment of the major fragments. Electronically Signed   By: Claudie Revering M.D.   On: 03/02/2017 16:44    Scheduled Meds: . [MAR Hold] aspirin EC  325 mg  Oral Daily  . [MAR Hold] atorvastatin  40 mg Oral QHS  . [MAR Hold] carvedilol  25 mg Oral BID WC  . chlorhexidine  60 mL Topical Once  . [MAR Hold] diltiazem  180 mg Oral Daily  . [MAR Hold] docusate sodium  100 mg Oral BID  . [MAR Hold] enoxaparin (LOVENOX) injection  40 mg Subcutaneous Q24H  . [MAR Hold] ferrous sulfate  325 mg Oral TID PC  . [MAR Hold] folic acid  1 mg Oral Daily  . [MAR Hold] isosorbide mononitrate  30 mg Oral QHS  . [MAR Hold] pantoprazole  40 mg Oral Daily  . [MAR Hold] phenytoin (DILANTIN) IV  100 mg Intravenous Q8H  . [MAR Hold] polyethylene glycol  17 g Oral Daily  . povidone-iodine  2 application Topical Once  . [MAR Hold] vitamin B-12  1,000 mcg Oral Daily   Continuous Infusions: . sodium chloride    . lactated ringers 10 mL/hr at 03/02/17 1429  . [MAR Hold] methocarbamol (ROBAXIN)  IV     PRN Meds: fentaNYL (SUBLIMAZE) injection, [MAR Hold] HYDROcodone-acetaminophen, [MAR Hold] methocarbamol **OR** [MAR Hold] methocarbamol (ROBAXIN)  IV, [MAR Hold]  morphine injection,  [MAR Hold] ondansetron **OR** [MAR Hold] ondansetron (ZOFRAN) IV, ondansetron (ZOFRAN) IV, oxyCODONE **OR** oxyCODONE  Time spent: 35 minutes  Author: Berle Mull, MD [facility] Pager: [phone] 03/02/2017 5:21 PM  If 7PM-7AM, please contact night-coverage at [email or web address], password [facility]

## 2017-03-02 NOTE — Progress Notes (Signed)
STROKE TEAM PROGRESS NOTE   HISTORY OF PRESENT ILLNESS (per record) [name] is a 76 y.o. female with a history of previous stroke with cortical encephalomalacia, bilateral carotid stenosis, coronary artery disease, hyperlipidemia, PSVT, diabetes mellitus, NAFLD, cirrhosis, and esophageal varices, who presented 02/28/17 with new onset seizures. She was discharged yesterday 02/27/17 with SVT and elevated troponin, then presented to [facility] emergency department with new onset seizures, which apparently were focal in the emergency department. She was loaded with dilantin.  CT head on 02/28/17 was negative for stroke.  MRI brain on 03/01/17 showed multifocal embolic punctate seizures in left precentral gyrus, left corona radiata, and right thalamus.  The patient was also found to have an acute left acute intertrochanteric femur fracture.  Patient was not administered IV t-PA secondary to being outside of the treatment window. She was admitted to General Neurology for further evaluation and treatment.    SUBJECTIVE (INTERVAL HISTORY) Her daughter is at the bedside.She presented with a seizure and MRI scan shows ill-defined diffusion positive lesions not sure whether these represent postictal findings versus subcortical infarcts    OBJECTIVE Temp:  [98.7 F (37.1 C)-99.8 F (37.7 C)] 99.6 F (37.6 C) (06/25 0510) Pulse Rate:  [66-89] 86 (06/25 0900) Cardiac Rhythm: Heart block (06/25 0700) Resp:  [17] 17 (06/25 0510) BP: (117-132)/(39-49) 121/44 (06/25 0900) SpO2:  [98 %-99 %] 99 % (06/25 0510)  CBC:  Recent Labs Lab 02/28/17 0308 03/01/17 0431 03/02/17 0559  WBC 11.0* 7.1 7.1  NEUTROABS 7.6 5.0  --   HGB 11.8* 8.0* 7.4*  HCT 35.7* 24.8* 23.0*  MCV 85.6 86.7 86.1  PLT 231 109* 91*    Basic Metabolic Panel:  Recent Labs Lab 02/28/17 0308 03/01/17 0431 03/02/17 0559  NA 139 139 137  K 4.1 3.6 3.8  CL 106 110 108  CO2 21* 22 21*  GLUCOSE 208* 93 114*  BUN 15 13 18   CREATININE  1.30* 0.89 0.70  CALCIUM 9.2 8.3* 8.2*  MG 2.0 1.8  --     Lipid Panel:    Component Value Date/Time   CHOL 98 03/02/2017 0559   CHOL 142 12/07/2013 1038   TRIG 78 03/02/2017 0559   TRIG 98 12/07/2013 1038   HDL 39 (L) 03/02/2017 0559   HDL 47 12/07/2013 1038   CHOLHDL 2.5 03/02/2017 0559   VLDL 16 03/02/2017 0559   LDLCALC 43 03/02/2017 0559   LDLCALC 75 12/07/2013 1038   HgbA1c:  Lab Results  Component Value Date   HGBA1C 5.4 11/02/2016   Urine Drug Screen: No results found for: LABOPIA, COCAINSCRNUR, LABBENZ, AMPHETMU, THCU, LABBARB  Alcohol Level     Component Value Date/Time   ETH <11 03/09/2014 1509    IMAGING  Ct Angio Head W/cm &/or Wo Cm 03/01/2017 IMPRESSION: 1. Intracranial atherosclerosis without proximal branch occlusion. Mild bilateral intracranial ICA stenosis and moderate to severe right P2 PCA stenosis. 2. 75% proximal left ICA stenosis. 3. Moderate to severe right and moderate left external carotid artery origin stenosis. 4. 50% proximal left subclavian artery stenosis. 5.  Aortic Atherosclerosis (ICD10-I70.0).   Mr Brain Wo Contrast 03/01/2017 IMPRESSION: 1. Punctate foci of abnormal diffusion signal in the left precentral gyrus, left corona radiata, and right thalamus compatible with acute/subacute embolic infarcts. 2. Chronic right frontal infarct and small vessel ischemic changes as above.  EEG: 02/28/2017 This normal EEG is recorded in the waking state. There was no seizure or seizure predisposition recorded on this study. Please note that  a normal EEG does not preclude the possibility of epilepsy.     PHYSICAL EXAM Pleasant frail elderly lady currently not in distress. . Afebrile. Head is nontraumatic. Neck is supple without bruit.    Cardiac exam no murmur or gallop. Lungs are clear to auscultation. Distal pulses are well felt. Neurological Exam ;  Awake  Alert oriented x 3. Normal speech and language.eye movements full without nystagmus.fundi  were not visualized. Vision acuity and fields appear normal. Hearing is normal. Palatal movements are normal. Face symmetric. Tongue midline. She has mild left hemiparesis with left hand weakness. Left leg weakness is 2/5 at left ankle foot drop and medial rotation. Left leg movements limited due to pain.. Normal sensation. Gait deferred.  ASSESSMENT/PLAN  [name] is a 76 y.o. female with history of multifocal embolic punctate seizures in left precentral gyrus, left corona radiata, and right thalamus presenting with new onset seizures. She did not receive IV t-PA due being outside of the treatment window.   Stroke: multifocal   punctate diffusion-positive lesions following seizures   in left precentral gyrus, left corona radiata, and right thalamus unclear whether postictal findings are small infarcts secondary to extensive atherosclerotic disease.  Resultant  left hemiparesis  CT head: no stroke  CT Angiogram: bilateral intracranial ICA stenosis, severe right P2 PCA stenosis, 75% proximal left ICA stenosis, severe right and moderate left external carotid artery origin stenosis, 50% proximal left subclavian artery stenosis, and aortic atherosclerosis  MRI head:  Punctate foci of abnormal diffusion signal in the left precentral gyrus, left corona radiata, and right thalamus compatible with acute/subacute embolic infarcts. 2. Chronic right frontal infarct and small vessel ischemic  2D Echo  Pending    LDL 43  HgbA1c pending   Lovenox 40 mg sq daily for VTE prophylaxis  Diet NPO time specified Except for: Sips with Meds  Diet NPO time specified Except for: Sips with Meds  aspirin 81 mg daily prior to admission, now on aspirin 325 mg daily  Patient counseled to be compliant with her antithrombotic medications  Ongoing aggressive stroke risk factor management  Therapy recommendations:  Pending   Disposition:  Pending   Hypertension  Stable Permissive hypertension (OK if  < 220/120) but gradually normalize in 5-7 days Long-term BP goal normotensive  Hyperlipidemia  Home meds: 38m atorvastatin PO daily, resumed in hospital  LDL 43, goal < 70  Continue statin at discharge  Diabetes  HgbA1c pending  goal < 7.0  Controlled  Other Stroke Risk Factors  Advanced age  Overweight, Body mass index is 23.04 kg/m., recommend weight loss, diet and exercise as appropriate   Hx stroke  Coronary artery disease  Obstructive sleep apnea, on CPAP at home  Other Active Problems  Elevated troponin  Stent in right coronary artery  Seizure, new onset  Hospital day # 2  I have personally examined this patient, reviewed notes, independently viewed imaging studies, participated in medical decision making and plan of care.ROS completed by me personally and pertinent positives fully documented  I have made any additions or clarifications directly to the above note. She has presented with seizure s likely symptomatic from old stroke and recommend continuing Dilantin for seizure prophylactics exist. MRI findings of diffusion-positive lesions unclear with whether tiny lacunar infarcts versus seizure effect. Long discussion with patient and family at the bedside and answered questions. Discussed with Dr. PPosey Pronto Continue aspirin for stroke prevention and Dilantin for seizures. Patient is neurologically cleared for hip state surgery if  medically necessary. Avoid hypotension and narcotics pain medications if possible. Greater than 50% time during this 35 minute visit was spent on counseling and coordination of care about her seizures, strokes, abnormal MRI findings and answered questions  Antony Contras, MD Medical Director [place] Pager: [phone] 03/02/2017 5:31 PM   To contact Stroke Continuity provider, please refer to [email or web address] After hours, contact General Neurology

## 2017-03-02 NOTE — Progress Notes (Signed)
OT Cancellation Note  Patient Details Name: Jassmin Kemmerer MRN: [id number] DOB: September 14, 1940   Cancelled Treatment:    Reason Eval/Treat Not Completed: Patient not medically ready (bedrest)  Vonita Moss   OTR/L Pager: [phone] Office: [phone] .  03/02/2017, 12:07 PM

## 2017-03-02 NOTE — Transfer of Care (Signed)
Immediate Anesthesia Transfer of Care Note  Patient: [name]  Procedure(s) Performed: Procedure(s): INTRAMEDULLARY (IM) NAIL FEMORAL (Left)  Patient Location: PACU  Anesthesia Type:Spinal  Level of Consciousness: awake, alert  and oriented  Airway & Oxygen Therapy: Patient Spontanous Breathing  Post-op Assessment: Report given to RN and Post -op Vital signs reviewed and stable  Post vital signs: Reviewed and stable  Last Vitals:  Vitals:   03/02/17 0510 03/02/17 0900  BP: (!) 132/49 (!) 121/44  Pulse: 83 86  Resp: 17   Temp: 37.6 C     Last Pain:  Vitals:   03/02/17 1100  TempSrc:   PainSc: 5       Patients Stated Pain Goal: 4 (18/36/72 5500)  Complications: No apparent anesthesia complications

## 2017-03-02 NOTE — H&P (View-Only) (Signed)
Patient ID: [name] MRN: [id number] DOB/AGE: 75-Nov-1942 75 y.o.  Admit date: 02/28/2017  Admission Diagnoses:  Principal Problem:   Seizure ([place]) Active Problems:   DM type 2 (diabetes mellitus, type 2) ([place])   Hypercholesteremia   Presence of stent in right coronary artery   HTN (hypertension)   Hepatic cirrhosis (HCC)   PSVT (paroxysmal supraventricular tachycardia) (HCC)   Cerebral infarction (HCC)   Elevated troponin   Hip fracture (HCC)   HPI: Pleasant 76 year old female pt with a past med hx of Cardiac disease, anemia, DM2.  She reports going to bed last night and waking up in the hospital.  She presented to the ED with status epilepticus.  Pt reports pain in her left hip.  Denies other pain.      Past Medical History: Past Medical History:  Diagnosis Date  . Anemia   . Aortic insufficiency    Moderate  . Back pain   . Carotid stenosis, bilateral   . Cirrhosis ([place])   . Coronary artery disease    Stent x 2 RCA 1995, cardiac catheterization 09/2016 showing only mild atherosclerosis  . DDD (degenerative disc disease), lumbar   . Essential hypertension   . GERD (gastroesophageal reflux disease)   . Hiatal hernia   . History of stroke   . Hypercholesteremia   . IBS (irritable bowel syndrome)   . Myocardial infarction ([place]) 1995  . Non-alcoholic fatty liver disease   . OSA (obstructive sleep apnea)    Sleep study 2009  AHI 9.91/hr and during REM sleep 35.58/hr  . Pericardial effusion    a. small by echo 2018.  Marland Kitchen PSVT (paroxysmal supraventricular tachycardia) ([place])   . Recurrent UTI   . Type 2 diabetes mellitus ([place])   . Varices, esophageal ([place])     Surgical History: Past Surgical History:  Procedure Laterality Date  . [place]  . APPENDECTOMY    . BACK SURGERY  1985  . CARDIAC CATHETERIZATION  [phone]   Stent to the proximal RCA after MI   . CARDIAC CATHETERIZATION N/A 09/26/2016   Procedure: Left Heart Cath and  Coronary Angiography;  Surgeon: Leonie Man, MD;  Location: [facility];  Service: Cardiovascular;  Laterality: N/A;  . CATARACT EXTRACTION W/PHACO Right 07/04/2014   Procedure: CATARACT EXTRACTION PHACO AND INTRAOCULAR LENS PLACEMENT (IOC);  Surgeon: Elta Guadeloupe T. Gershon Crane, MD;  Location: [facility];  Service: Ophthalmology;  Laterality: Right;  CDE:13.13  . COLONOSCOPY    . DILATION AND CURETTAGE OF UTERUS     x2  . Epi Retinal Membrane Peel Left   . ERCP    . ESOPHAGEAL BANDING N/A 04/01/2013   Procedure: ESOPHAGEAL BANDING;  Surgeon: Rogene Houston, MD;  Location: [facility];  Service: Endoscopy;  Laterality: N/A;  . ESOPHAGEAL BANDING N/A 05/24/2013   Procedure: ESOPHAGEAL BANDING;  Surgeon: Rogene Houston, MD;  [facility];  Service: Endoscopy;  Laterality: N/A;  . ESOPHAGEAL BANDING N/A 06/21/2014   Procedure: ESOPHAGEAL BANDING;  Surgeon: Rogene Houston, MD;  [facility];  Service: Endoscopy;  Laterality: N/A;  . ESOPHAGOGASTRODUODENOSCOPY N/A 04/01/2013   Procedure: ESOPHAGOGASTRODUODENOSCOPY (EGD);  Surgeon: Rogene Houston, MD;  [facility];  Service: Endoscopy;  Laterality: N/A;  230-rescheduled to 8:30am Ann notified pt  . ESOPHAGOGASTRODUODENOSCOPY N/A 05/24/2013   Procedure: ESOPHAGOGASTRODUODENOSCOPY (EGD);  Surgeon: Rogene Houston, MD;  [facility];  Service: Endoscopy;  Laterality:  N/A;  730  . ESOPHAGOGASTRODUODENOSCOPY N/A 06/21/2014   Procedure: ESOPHAGOGASTRODUODENOSCOPY (EGD);  Surgeon: Rogene Houston, MD;  [facility];  Service: Endoscopy;  Laterality: N/A;  930-rescheduled 10/14 @ 1200 Ann to notify pt  . EYE SURGERY  08   cataract surgery of the left eye  . HARDWARE REMOVAL Right 01/17/2013   Procedure: REMOVAL OF HARDWARE AND EXCISION ULNAR STYLOID RIGHT WRIST;  Surgeon: Tennis Must, MD;  Location: [facility];  Service: Orthopedics;  Laterality: Right;  . MYRINGOTOMY  2012   both ears    . TONSILLECTOMY    . VAGINAL HYSTERECTOMY  1972  . WRIST SURGERY     rt wrist hardwear removal    Family History: Family History  Problem Relation Age of Onset  . Diabetes Mother   . Heart failure Father   . Heart failure Maternal Aunt     Social History: Social History   Social History  . Marital status: Widowed    Spouse name: N/A  . Number of children: 2  . Years of education: N/A   Occupational History  .  Retired   Social History Main Topics  . Smoking status: Former Smoker    Packs/day: 0.25    Years: 50.00    Types: Cigarettes    Quit date: 01/13/2012  . Smokeless tobacco: Never Used  . Alcohol use No  . Drug use: No  . Sexual activity: Yes    Birth control/ protection: Surgical   Other Topics Concern  . Not on file   Social History Narrative  . No narrative on file    Allergies: Tape  Medications: I have reviewed the patient's current medications.  Vital Signs: Patient Vitals for the past 24 hrs:  BP Temp Pulse Resp SpO2 Height Weight  02/28/17 1513 (!) 129/44 - 92 20 100 % - -  02/28/17 1300 - - - - - 5' 3"  (1.6 m) 59 kg (130 lb 1.1 oz)  02/28/17 0936 (!) 119/43 98 F (36.7 C) 70 16 100 % - -  02/28/17 0800 (!) 123/43 - 75 18 98 % - -  02/28/17 0715 (!) 101/58 - - 17 - - -  02/28/17 0700 (!) 96/54 - - 16 - - -  02/28/17 0645 (!) 111/44 - 73 15 99 % - -  02/28/17 0630 (!) 95/34 - 65 13 99 % - -  02/28/17 0530 (!) 82/33 - 61 (!) 24 98 % - -  02/28/17 0500 (!) 101/47 - 70 20 98 % - -  02/28/17 0449 (!) 107/56 - 72 19 97 % - -  02/28/17 0400 (!) 113/47 - 82 19 95 % - -  02/28/17 0330 (!) 142/52 - 94 11 96 % - -  02/28/17 0300 (!) 167/59 - 98 20 100 % - -    Radiology: Dg Chest 1 View  Result Date: 02/28/2017 CLINICAL DATA:  Seizures, pain. EXAM: PELVIS - 1-2 VIEW; CHEST  1 VIEW COMPARISON:  CT abdomen and pelvis July 09, 2015 FINDINGS: Chest: Cardiac silhouette is mildly enlarged, calcified aortic knob. No pleural effusion or focal  consolidation. Elevated LEFT hemidiaphragm with strandy densities. No pneumothorax. Soft tissue planes and included osseous structures are nonsuspicious. Pelvis: Acute comminuted LEFT femur intertrochanteric fracture with impaction. Varus angulation of the distal bony fragments. Femoral heads are located. Osteopenia without destructive bony lesions. Moderate vascular calcifications. IMPRESSION: Chest: Mild cardiomegaly, LEFT lung base atelectasis. Pelvis: Acute LEFT femur intertrochanteric fracture. Electronically Signed  By: Elon Alas M.D.   On: 02/28/2017 04:11   Dg Chest 2 View  Result Date: 02/26/2017 CLINICAL DATA:  CENTER CHEST PAIN SINCE LAST NIGHT, HX OF MI X 2, MOST RECENT 2 YEARS AGO, FORMER SMOKER X 4 YEARS EXAM: CHEST  2 VIEW COMPARISON:  12/07/2016 FINDINGS: Lungs are hyperinflated. Heart size is normal. There are no focal consolidations or pleural effusions. No pulmonary edema. Degenerative changes are seen in thoracic spine. IMPRESSION: No active cardiopulmonary disease. Electronically Signed   By: Nolon Nations M.D.   On: 02/26/2017 12:43   Dg Pelvis 1-2 Views  Result Date: 02/28/2017 CLINICAL DATA:  Seizures, pain. EXAM: PELVIS - 1-2 VIEW; CHEST  1 VIEW COMPARISON:  CT abdomen and pelvis July 09, 2015 FINDINGS: Chest: Cardiac silhouette is mildly enlarged, calcified aortic knob. No pleural effusion or focal consolidation. Elevated LEFT hemidiaphragm with strandy densities. No pneumothorax. Soft tissue planes and included osseous structures are nonsuspicious. Pelvis: Acute comminuted LEFT femur intertrochanteric fracture with impaction. Varus angulation of the distal bony fragments. Femoral heads are located. Osteopenia without destructive bony lesions. Moderate vascular calcifications. IMPRESSION: Chest: Mild cardiomegaly, LEFT lung base atelectasis. Pelvis: Acute LEFT femur intertrochanteric fracture. Electronically Signed   By: Elon Alas M.D.   On: 02/28/2017 04:11     Dg Ankle 2 Views Left  Result Date: 02/28/2017 CLINICAL DATA:  Seizure.  No additional history provided. EXAM: LEFT ANKLE - 2 VIEW COMPARISON:  None. FINDINGS: There is no evidence of fracture, dislocation, or joint effusion. Ankle mortise is preserved. There is a small plantar calcaneal spur. No bony destructive change or significant arthropathy. There is diffuse soft tissue edema. IMPRESSION: Diffuse soft tissue edema.  No acute osseous abnormality. Electronically Signed   By: Jeb Levering M.D.   On: 02/28/2017 04:10   Ct Head Wo Contrast  Result Date: 02/28/2017 CLINICAL DATA:  76 year old female with seizures. EXAM: CT HEAD WITHOUT CONTRAST TECHNIQUE: Contiguous axial images were obtained from the base of the skull through the vertex without intravenous contrast. COMPARISON:  Brain MRI dated 05/05/2016 FINDINGS: Brain: There is moderate brain atrophy and chronic microvascular ischemic changes. An area of old infarct and encephalomalacia noted in the right frontal lobe. Small bilateral basal ganglia old lacunar infarct noted. There is no acute intracranial hemorrhage. No mass effect or midline shift noted. No extra-axial fluid collection. Vascular: No hyperdense vessel or unexpected calcification. Skull: Normal. Negative for fracture or focal lesion. Sinuses/Orbits: No acute finding. Other: None IMPRESSION: 1. No acute intracranial hemorrhage. 2. Moderate age-related atrophy and chronic microvascular ischemic changes. Old right frontal infarct. Electronically Signed   By: Anner Crete M.D.   On: 02/28/2017 03:38   Dg Femur Min 2 Views Left  Result Date: 02/28/2017 CLINICAL DATA:  Seizure. EXAM: LEFT FEMUR 2 VIEWS COMPARISON:  None. FINDINGS: Diffuse osteopenia. Moderately displaced intertrochanteric fracture of the left femur with exaggerated coxa vera deformity about the fracture site. Mild degenerate changes of the left hip and knee. Calcified plaque over the left femoral artery.  IMPRESSION: Moderately displaced left intertrochanteric fracture. Electronically Signed   By: Marin Olp M.D.   On: 02/28/2017 07:24    Labs:  Recent Labs  02/26/17 1258 02/28/17 0308  WBC 8.5 11.0*  RBC 4.17 4.17  HCT 35.5* 35.7*  PLT 167 231    Recent Labs  02/27/17 0357 02/28/17 0308  NA 136 139  K 3.9 4.1  CL 109 106  CO2 19* 21*  BUN 12 15  CREATININE  0.72 1.30*  GLUCOSE 107* 208*  CALCIUM 8.8* 9.2    Recent Labs  02/26/17 1444  INR 1.16    Review of Systems: ROS  Physical Exam: Neurologically intact ABD soft Sensation intact distally No cellulitis present Compartment soft Left leg is shorted and externally rotated  Assessment and Plan: Surgical intervention will be performed by Dr. [name] on Monday Pt needs to be NPO after midnight on Sunday  Chrystel Barefield, [facility] for Melina Schools, MD [place] [phone]

## 2017-03-02 NOTE — Anesthesia Procedure Notes (Signed)
Procedure Name: MAC Date/Time: 03/02/2017 3:45 PM Performed by: Garrison Columbus T Pre-anesthesia Checklist: Patient identified, Emergency Drugs available, Suction available and Patient being monitored Patient Re-evaluated:Patient Re-evaluated prior to inductionOxygen Delivery Method: Simple face mask Preoxygenation: Pre-oxygenation with 100% oxygen Intubation Type: IV induction Placement Confirmation: positive ETCO2 and breath sounds checked- equal and bilateral Dental Injury: Teeth and Oropharynx as per pre-operative assessment

## 2017-03-02 NOTE — Progress Notes (Signed)
All blood pressure medication held, per Dr. Serita Grit order.

## 2017-03-02 NOTE — Anesthesia Postprocedure Evaluation (Signed)
Anesthesia Post Note  Patient: [name]  Procedure(s) Performed: Procedure(s) (LRB): INTRAMEDULLARY (IM) NAIL FEMORAL (Left)     Patient location during evaluation: PACU Anesthesia Type: Spinal Level of consciousness: awake, awake and alert and oriented Pain management: pain level controlled Vital Signs Assessment: post-procedure vital signs reviewed and stable Respiratory status: spontaneous breathing, nonlabored ventilation and respiratory function stable Cardiovascular status: blood pressure returned to baseline Postop Assessment: spinal receding    Last Vitals:  Vitals:   03/02/17 1844 03/02/17 1902  BP: (!) 163/59 (!) 147/53  Pulse: 91 92  Resp:  15  Temp: 37.5 C 37.5 C    Last Pain:  Vitals:   03/02/17 1902  TempSrc: Oral  PainSc:                  [name]

## 2017-03-02 NOTE — Interval H&P Note (Signed)
History and Physical Interval Note:  03/02/2017 2:42 PM  [name]  has presented today for surgery, with the diagnosis of LEFT INTERTROCH FEMORAL NAIL  The various methods of treatment have been discussed with the patient and family. After consideration of risks, benefits and other options for treatment, the patient has consented to  Procedure(s): INTRAMEDULLARY (IM) NAIL FEMORAL (Left) as a surgical intervention .  The patient's history has been reviewed, patient examined, no change in status, stable for surgery.  I have reviewed the patient's chart and labs.  Questions were answered to the patient's satisfaction.    Of note, patient has absent DF on LLE; great toe extension and PF intact. She reports intact sensation. 2+ DP.  The risks, benefits, and alternatives were discussed with the patient. There are risks associated with the surgery including, but not limited to, problems with anesthesia (death), infection, differences in leg length/angulation/rotation, fracture of bones, loosening or failure of implants, malunion, nonunion, hematoma (blood accumulation) which may require surgical drainage, blood clots, pulmonary embolism, nerve injury (foot drop), and blood vessel injury. The patient understands these risks and elects to proceed.    [name], Horald Pollen

## 2017-03-02 NOTE — Op Note (Signed)
OPERATIVE REPORT  SURGEON: Rod Can, MD   ASSISTANT: Ky Barban, RNFA.  PREOPERATIVE DIAGNOSIS: Left pertrochanteric femur fracture.   POSTOPERATIVE DIAGNOSIS: Left pertrochanteric chanteric femur fracture.   PROCEDURE: Intramedullary fixation, Left femur.   IMPLANTS: Biomet Affixus Hip Fracture Nail, 9 by 360 mm, 130 degrees. 10.5 x 90 mm Hip Fracture Nail Lag Screw. 5 x 34 mm distal interlocking screw 1.  ANESTHESIA:  Spinal  ESTIMATED BLOOD LOSS: 100 mL.  BLOOD PRODUCTS: 1 unit PRBCs.  ANTIBIOTICS: 2 g Ancef.  DRAINS: None.  COMPLICATIONS: None.   CONDITION: PACU - hemodynamically stable.Marland Kitchen   BRIEF CLINICAL NOTE: [name] is a 76 y.o. female who presented with an intertrochanteric femur fracture. The patient was admitted to the hospitalist service and underwent perioperative risk stratification and medical optimization. The risks, benefits, and alternatives to the procedure were explained, and the patient elected to proceed.  PROCEDURE IN DETAIL: Surgical site was marked by myself. The patient was taken to the operating room and anesthesia was induced on the bed. The patient was then transferred to the [facility] table and the nonoperative lower extremity was scissored underneath the operative side. The fracture was reduced with traction, internal rotation, and adduction. The hip was prepped and draped in the normal sterile surgical fashion. Timeout was called verifying side and site of surgery. Preop antibiotics were given with 60 minutes of beginning the procedure.  Fluoroscopy was used to define the patient's anatomy. A 4 cm incision was made just proximal to the tip of the greater trochanter. The awl was used to obtain the standard starting point for a trochanteric entry nail under fluoroscopic control. The guidepin was placed. The entry reamer was used to open the proximal femur.  I placed the guidewire to the level of the physeal scar of the knee. I measured the  length of the guidewire. Sequential reaming was performed up to a size 10.5 mm with excellent chatter. Therefore, a size 9 by 360 mm nail was selected and assembled to the jig on the back table. The nail was placed without any difficulty. Through a separate stab incision, the cannula was placed down to the bone in preparation for the cephalomedullary device. A guidepin was placed into the femoral head using AP and lateral fluoroscopy views. The pin was measured, and then reaming was performed to the appropriate depth. The lag screw was inserted to the appropriate depth. The fracture was compressed through the jig. The setscrew was tightened and then loosened one quarter turn. Using perfect circle technique, a distal interlocking screw was placed. The jig was removed. Final AP and lateral fluoroscopy views were obtained to confirm fracture reduction and hardware placement. Tip apex distance was appropriate. There was no chondral penetration.  The wounds were copiously irrigated with saline. The wound was closed in layers with #1 Vicryl for the fascia, 2-0 Monocryl for the deep dermal layer, and 3-0 Monocryl subcuticular stitch. Glue was applied to the skin. Once the glue was fully hardened, sterile dressing was applied. The patient was then awakened from anesthesia and taken to the PACU in stable condition. Sponge needle and instrument counts were correct at the end of the case 2. There were no known complications.  We will readmit the patient to the hospitalist. Weightbearing status will be touchdown weightbearing with a walker. We will begin Lovenox for DVT prophylaxis. The patient will work with physical therapy and undergo disposition planning.

## 2017-03-02 NOTE — Progress Notes (Signed)
SLP Cancellation Note  Patient Details Name: [name] MRN: [id number] DOB: 1941-06-11   Cancelled treatment:        Pt currently undergoing  LEFT INTERTROCH FEMORAL NAIL surgery. Will follow for initiation of speech-language-cognitive assessment.   [name] 03/02/2017, 3:30 PM  [name].Ed [employer] [phone]

## 2017-03-02 NOTE — Progress Notes (Signed)
Pt arrived at the unit from [place] at 1830.

## 2017-03-03 ENCOUNTER — Encounter (HOSPITAL_COMMUNITY): Payer: Self-pay | Admitting: Orthopedic Surgery

## 2017-03-03 ENCOUNTER — Inpatient Hospital Stay (HOSPITAL_COMMUNITY): Payer: Medicare Other

## 2017-03-03 DIAGNOSIS — I342 Nonrheumatic mitral (valve) stenosis: Secondary | ICD-10-CM

## 2017-03-03 LAB — ECHOCARDIOGRAM COMPLETE
AV Peak grad: 32 mmHg
AV VEL mean LVOT/AV: 0.49
AV pk vel: 281 cm/s
AVG: 17 mmHg
AVLVOTPG: 8 mmHg
AVPHT: 426 ms
Ao pk vel: 0.51 m/s
CHL CUP MV DEC (S): 268
CHL CUP MV M VEL: 114
DOP CAL AO MEAN VELOCITY: 190 cm/s
E decel time: 268 msec
EERAT: 18.56
Height: 63 in
LA vol: 86.2 mL
LAVOLA4C: 77.4 mL
LAVOLIN: 53 mL/m2
LV TDI E'LATERAL: 7.49
LV TDI E'MEDIAL: 6.6
LV dias vol index: 66 mL/m2
LV dias vol: 107 mL — AB (ref 46–106)
LVEEAVG: 18.56
LVEEMED: 18.56
LVELAT: 7.49 cm/s
LVOT VTI: 28.7 cm
LVOT peak VTI: 0.52 cm
LVOT peak vel: 144 cm/s
MV Annulus VTI: 77.3 cm
MV pk A vel: 141 m/s
MV pk E vel: 139 m/s
MVPG: 8 mmHg
Mean grad: 6 mmHg
RV LATERAL S' VELOCITY: 9.34 cm/s
RV sys press: 18 mmHg
Reg peak vel: 193 cm/s
TAPSE: 20.3 mm
TR max vel: 193 cm/s
VTI: 55.1 cm
Weight: 2081.14 oz

## 2017-03-03 LAB — BPAM RBC
Blood Product Expiration Date: 201807032359
Blood Product Expiration Date: 201807032359
ISSUE DATE / TIME: 201806251552
ISSUE DATE / TIME: 201806251552
Unit Type and Rh: 6200
Unit Type and Rh: 6200

## 2017-03-03 LAB — BASIC METABOLIC PANEL
Anion gap: 8 (ref 5–15)
BUN: 11 mg/dL (ref 6–20)
CHLORIDE: 104 mmol/L (ref 101–111)
CO2: 23 mmol/L (ref 22–32)
Calcium: 8.1 mg/dL — ABNORMAL LOW (ref 8.9–10.3)
Creatinine, Ser: 0.65 mg/dL (ref 0.44–1.00)
GFR calc Af Amer: >60 mL/min (ref >60)
GFR calc non Af Amer: >60 mL/min (ref >60)
GLUCOSE: 137 mg/dL — AB (ref 65–99)
POTASSIUM: 3.2 mmol/L — AB (ref 3.5–5.1)
Sodium: 135 mmol/L (ref 135–145)

## 2017-03-03 LAB — TYPE AND SCREEN
ABO/RH(D): A POS
Antibody Screen: NEGATIVE
Unit division: 0
Unit division: 0

## 2017-03-03 LAB — HEMOGLOBIN A1C
Hgb A1c MFr Bld: 5.7 % — ABNORMAL HIGH (ref 4.8–5.6)
MEAN PLASMA GLUCOSE: 117 mg/dL

## 2017-03-03 LAB — CBC
HCT: 28.4 % — ABNORMAL LOW (ref 36.0–46.0)
HEMOGLOBIN: 9.5 g/dL — AB (ref 12.0–15.0)
MCH: 28.5 pg (ref 26.0–34.0)
MCHC: 33.5 g/dL (ref 30.0–36.0)
MCV: 85.3 fL (ref 78.0–100.0)
PLATELETS: 108 10*3/uL — AB (ref 150–400)
RBC: 3.33 MIL/uL — AB (ref 3.87–5.11)
RDW: 15.8 % — ABNORMAL HIGH (ref 11.5–15.5)
WBC: 9.4 10*3/uL (ref 4.0–10.5)

## 2017-03-03 MED ORDER — CARVEDILOL 12.5 MG PO TABS: 12.5000 mg | ORAL_TABLET | Freq: Two times a day (BID) | ORAL | Status: DC

## 2017-03-03 MED ORDER — POTASSIUM CHLORIDE CRYS ER 20 MEQ PO TBCR
40.0000 meq | EXTENDED_RELEASE_TABLET | Freq: Once | ORAL | Status: AC
  Administered 2017-03-03: 40 meq via ORAL
  Filled 2017-03-03: qty 2

## 2017-03-03 MED ORDER — SODIUM CHLORIDE 0.9 % IV BOLUS (SEPSIS)
250.0000 mL | Freq: Once | INTRAVENOUS | Status: AC
  Administered 2017-03-03: 250 mL via INTRAVENOUS

## 2017-03-03 NOTE — Progress Notes (Signed)
Pt was hypotensive. Dr. Posey Pronto has been notified.

## 2017-03-03 NOTE — Progress Notes (Addendum)
[facility] Progress Note  Patient: [name] WLS:[id number]   PCP: Redmond School, MD DOB: 1941/06/05   DOA: 02/28/2017   DOS: 03/03/2017   Date of Service: the patient was seen and examined on 03/03/2017  Subjective: The patient herself denies any complaint. Her pain in the hip is well controlled. No nausea no vomiting. No diarrhea. No abdominal pain. Tolerated procedure very well. Patient was telling the physical therapy that she has caught mouse in the room and she would like the therapist to throw it out, while there was no mass in the room.  Brief hospital course: Pt. with PMH of CVA, anemia, CAD; admitted on 02/28/2017, presented with complaint of seizure, was found to have acute onset seizure as well as CVA as well as left hip fracture. Currently further plan is to monitor for improvement in acute encephalopathy as well as postoperative recovery.  Assessment and Plan: 1. Acute seizure. Onset unclear.  Started on Dilantin, pharmacy monitoring the dose. EEG negative for any focus of seizure. MRI brain shows multiple punctate stroke. No mass. No further seizure episodes while she is in the hospital. Outpatient neurology input. Continue Dilantin.  2. Acute CVA. More likely artifact from seizures. Past medical history of CVA on aspirin 243 mg daily. Also on Lipitor. MRI brain shows punctate foci of abnormal diffusion in the left precentral gyrus, left coronary radiata, right thalamus, acute subacute embolic infarct. CT angiogram of the head and neck shows 75% left ICA stenosis, severe right and moderate left ECA stenosis, 50% subclavian stenosis as well as moderate right PCA stenosis. Urology was consulted, appreciate their input. The patient was cleared for surgery and tolerated it well. Per neurology, These findings of the MRI are more likely artifact after seizure episode rather than true stroke. Recommend no further workup at present. Currently on 325 mg aspirin. Passed  swallowing evaluation by speech therapy on cardiac diet. PTOT consulted , SNF recommended.  3. Left hip fracture. Acute blood loss anemia. Patient had a fall along with the seizure which led to a left hip fracture. Orthopedic has been consulted. Patient was cleared for the surgery by neurology. Underwent intramedullary nail placement. Tolerated the procedure. Hemoglobin significantly low from baseline. No external bleeding reported. Monitor H&H.  4. Coronary artery disease. SVT. Hypertension. On lisinopril, Imdur, Coreg and Cardizem at home. Currently blood pressure low. Holding all antihypertensive medications. Also reducing Cardizem dose from 25 mg twice a day to 12.5 mg twice a day with holding parameters. Monitor. Recently admitted for chest pain and ruled out for ACS. At that time her medications were titrated for SVT.  5. Acute encephalopathy. Likely postoperative anesthesia effect. Monitor for improvement. Minimize narcotics.  6. Hypokalemia. Replacing, recheck tomorrow.  7. Thrombocytopenia. Continue close monitoring at present.  Diet: Cardiac diet DVT Prophylaxis: subcutaneous Heparin  Advance goals of care discussion: Full code  Family Communication: no family was present at bedside, at the time of interview.   Disposition:  Discharge to SNF.  Consultants: Orthopedics, neurology Procedures: EEG  Antibiotics: Anti-infectives    Start     Dose/Rate Route Frequency Ordered Stop   03/02/17 2200  ceFAZolin (ANCEF) IVPB 2g/100 mL premix     2 g 200 mL/hr over 30 Minutes Intravenous Every 6 hours 03/02/17 1912 03/03/17 0437   03/02/17 1300  ceFAZolin (ANCEF) IVPB 2g/100 mL premix     2 g 200 mL/hr over 30 Minutes Intravenous To [facility] 03/02/17 0420 03/02/17 1555       Objective:  Physical Exam: Vitals:   03/03/17 0407 03/03/17 1312 03/03/17 1326 03/03/17 1459  BP: (!) 136/57 (!) 97/33 (!) 98/44 (!) 105/38  Pulse: 94   (!) 56  Resp: 17   16    Temp: 99.1 F (37.3 C) 97.8 F (36.6 C)  98 F (36.7 C)  TempSrc: Oral Oral  Oral  SpO2: 96%   100%  Weight:      Height:        Intake/Output Summary (Last 24 hours) at 03/03/17 2013 Last data filed at 03/03/17 1500  Gross per 24 hour  Intake              560 ml  Output              750 ml  Net             -190 ml   Filed Weights   02/28/17 1300  Weight: 59 kg (130 lb 1.1 oz)   General: Alert, Awake and Oriented to Time, Place and Person. Appear in  distress, affect appropriate Eyes: PERRL, Conjunctiva normal ENT: Oral Mucosa clear moist. Neck: no JVD, no Abnormal Mass Or lumps Cardiovascular: S1 and S2 Present, no Murmur, Peripheral Pulses Present Respiratory: normal respiratory effort, Bilateral Air entry equal and Decreased, no use of accessory muscle, Clear to Auscultation, no Crackles, no wheezes Abdomen: Bowel Sound present, Soft and no tenderness, n hernia Skin: no redness, no Rash, no induration Extremities: no Pedal edema, no calf tenderness Neurologic: Grossly no focal neuro deficit. Bilaterally Equal motor strength  Data Reviewed: CBC:  Recent Labs Lab 02/26/17 1258 02/28/17 0308 03/01/17 0431 03/02/17 0559 03/03/17 0724  WBC 8.5 11.0* 7.1 7.1 9.4  NEUTROABS  --  7.6 5.0  --   --   HGB 11.6* 11.8* 8.0* 7.4* 9.5*  HCT 35.5* 35.7* 24.8* 23.0* 28.4*  MCV 85.1 85.6 86.7 86.1 85.3  PLT 167 231 109* 91* 768*   Basic Metabolic Panel:  Recent Labs Lab 02/27/17 0357 02/28/17 0308 03/01/17 0431 03/02/17 0559 03/03/17 0724  NA 136 139 139 137 135  K 3.9 4.1 3.6 3.8 3.2*  CL 109 106 110 108 104  CO2 19* 21* 22 21* 23  GLUCOSE 107* 208* 93 114* 137*  BUN 12 15 13 18 11   CREATININE 0.72 1.30* 0.89 0.70 0.65  CALCIUM 8.8* 9.2 8.3* 8.2* 8.1*  MG 1.4* 2.0 1.8  --   --     Liver Function Tests:  Recent Labs Lab 02/28/17 0308 03/01/17 0431  AST 32 31  ALT 20 15  ALKPHOS 76 50  BILITOT 0.4 0.6  PROT 7.1 5.1*  ALBUMIN 4.0 2.8*   No results  for input(s): LIPASE, AMYLASE in the last 168 hours.  Recent Labs Lab 02/28/17 1029  AMMONIA 15   Coagulation Profile:  Recent Labs Lab 02/26/17 1444 03/01/17 0431  INR 1.16 1.28   Cardiac Enzymes:  Recent Labs Lab 02/26/17 1258 02/26/17 1610 02/26/17 2150 02/27/17 0357  TROPONINI 0.32* 0.35* 0.35* 0.38*   BNP (last 3 results) No results for input(s): PROBNP in the last 8760 hours. CBG:  Recent Labs Lab 02/28/17 0242 03/01/17 0320 03/02/17 0815 03/02/17 1238 03/02/17 1733  GLUCAP 175* 91 123* 115* 128*   Studies: Pelvis Portable  Result Date: 03/02/2017 CLINICAL DATA:  75 year old female post left femur open reduction and internal fixation. Subsequent encounter. EXAM: PORTABLE PELVIS 1-2 VIEWS COMPARISON:  Intraoperative exam 03/02/2017 and preoperative exam 02/28/2017. FINDINGS: Mild rotation to the left. Left  acetabulum/ pelvic wall better delineated on 03/02/2017 femur films. Post left intertrochanteric open reduction and internal fixation without complicating feature noted. Vascular calcifications. IMPRESSION: Post left intertrochanteric fracture reduction.  Please see above. Aortic atherosclerosis. Electronically Signed   By: Genia Del M.D.   On: 03/02/2017 20:27    Scheduled Meds: . aspirin EC  325 mg Oral Daily  . atorvastatin  40 mg Oral QHS  . docusate sodium  100 mg Oral BID  . enoxaparin (LOVENOX) injection  40 mg Subcutaneous Q24H  . ferrous sulfate  325 mg Oral TID PC  . folic acid  1 mg Oral Daily  . pantoprazole  40 mg Oral Daily  . phenytoin (DILANTIN) IV  100 mg Intravenous Q8H  . polyethylene glycol  17 g Oral Daily  . vitamin B-12  1,000 mcg Oral Daily   Continuous Infusions:  PRN Meds: acetaminophen **OR** acetaminophen, HYDROcodone-acetaminophen, menthol-cetylpyridinium **OR** phenol, methocarbamol **OR** [DISCONTINUED] methocarbamol (ROBAXIN)  IV, ondansetron **OR** ondansetron (ZOFRAN) IV  Time spent: 35 minutes  Author: Berle Mull, MD [facility] Pager: [phone] 03/03/2017 8:13 PM  If 7PM-7AM, please contact night-coverage at [email or web address], password [facility]

## 2017-03-03 NOTE — NC FL2 (Signed)
[place] LEVEL OF CARE SCREENING TOOL     IDENTIFICATION  Patient Name: [name] Birthdate: 1941-08-27 Sex: female Admission Date (Current Location): 02/28/2017  [facility] and [place] Number:  [employer] and Address:  [facility] [place], [place] [address] [place], [place] [address]      Provider Number: [id number]  Attending Physician Name and Address:  Lavina Hamman, MD  Relative Name and Phone Number:  [name], son, [phone]    Current Level of Care: Hospital Recommended Level of Care: [place] Prior Approval Number:    Date Approved/Denied:   PASRR Number: [id number]  Discharge Plan: SNF    Current Diagnoses: Patient Active Problem List   Diagnosis Date Noted  . Closed pertrochanteric fracture of femur, left, initial encounter ([place]) 03/02/2017  . Seizure ([place]) 02/28/2017  . Hip fracture ([place]) 02/28/2017  . Chest pain 12/07/2016  . Atypical chest pain 12/07/2016  . Tachycardia   . NSTEMI (non-ST elevated myocardial infarction) ([place]) 11/02/2016  . Multifocal atrial tachycardia ([place]) 09/25/2016  . Elevated troponin 09/24/2016  . Hypokalemia 09/23/2016  . TIA (transient ischemic attack) 05/05/2016  . Intractable nausea and vomiting 10/15/2015  . Acute coronary syndrome ([place]) 06/01/2015  . Bronchitis 06/01/2015  . Thrombocytopenia ([place]) 06/01/2015  . Cerebral infarction ([place]) 03/06/2014  . UTI (lower urinary tract infection)   . PSVT (paroxysmal supraventricular tachycardia) ([place]) 12/09/2013  . Esophageal varices ([place]) 03/29/2013  . Hematemesis 03/29/2013  . Hepatic cirrhosis ([place]) 03/29/2013  . Presence of stent in right coronary artery 07/04/2011  . OSA (obstructive sleep apnea) 07/04/2011  . Aortic sclerosis 07/04/2011  . Aortic insufficiency 07/04/2011  . HTN (hypertension) 07/04/2011  . Carotid stenosis, bilateral 07/04/2011  . Chest pain at rest 07/03/2011  . CAD (coronary artery  disease) 07/03/2011  . DM type 2 (diabetes mellitus, type 2) ([place]) 07/03/2011  . Hypercholesteremia 07/03/2011  . GERD (gastroesophageal reflux disease) 07/03/2011    Orientation RESPIRATION BLADDER Height & Weight     Self, Time, Situation, Place  Normal Continent, Indwelling catheter Weight: 59 kg (130 lb 1.1 oz) (from 02/26/17 4:18 PM) Height:  5' 3"  (160 cm) (from 02/26/17 4:18 PM)  BEHAVIORAL SYMPTOMS/MOOD NEUROLOGICAL BOWEL NUTRITION STATUS      Continent Diet (Please see DC Summary)  AMBULATORY STATUS COMMUNICATION OF NEEDS Skin   Extensive Assist Verbally Surgical wounds (Closed incision on hip)                       Personal Care Assistance Level of Assistance  Bathing, Feeding, Dressing Bathing Assistance: Maximum assistance Feeding assistance: Independent Dressing Assistance: Limited assistance     Functional Limitations Info  Sight Sight Info: Impaired        SPECIAL CARE FACTORS FREQUENCY  PT (By licensed PT), OT (By licensed OT)     PT Frequency: 5x/week OT Frequency: 3x/week            Contractures      Additional Factors Info  Code Status, Allergies Code Status Info: Full Allergies Info: Tape           Current Medications (03/03/2017):  This is the current hospital active medication list Current Facility-Administered Medications  Medication Dose Route Frequency Provider Last Rate Last Dose  . acetaminophen (TYLENOL) tablet 650 mg  650 mg Oral Q6H PRN Swinteck, Aaron Edelman, MD       Or  . acetaminophen (TYLENOL) suppository 650 mg  650 mg Rectal Q6H  PRN Rod Can, MD      . aspirin EC tablet 325 mg  325 mg Oral Daily Lavina Hamman, MD   325 mg at 03/03/17 1023  . atorvastatin (LIPITOR) tablet 40 mg  40 mg Oral QHS Orvan Falconer, MD   40 mg at 03/02/17 2128  . carvedilol (COREG) tablet 25 mg  25 mg Oral BID WC Orvan Falconer, MD   25 mg at 03/03/17 1028  . diltiazem (CARDIZEM CD) 24 hr capsule 180 mg  180 mg Oral Daily Orvan Falconer, MD   180 mg at  03/01/17 0801  . docusate sodium (COLACE) capsule 100 mg  100 mg Oral BID Lavina Hamman, MD   100 mg at 03/03/17 1023  . enoxaparin (LOVENOX) injection 40 mg  40 mg Subcutaneous Q24H Rod Can, MD   40 mg at 03/03/17 1024  . ferrous sulfate tablet 325 mg  325 mg Oral TID PC Lavina Hamman, MD   325 mg at 03/03/17 1023  . folic acid (FOLVITE) tablet 1 mg  1 mg Oral Daily Lavina Hamman, MD   1 mg at 03/03/17 1023  . HYDROcodone-acetaminophen (NORCO/VICODIN) 5-325 MG per tablet 1-2 tablet  1-2 tablet Oral Q6H PRN Lavina Hamman, MD   2 tablet at 03/03/17 1108  . isosorbide mononitrate (IMDUR) 24 hr tablet 30 mg  30 mg Oral QHS Orvan Falconer, MD   30 mg at 03/02/17 2128  . menthol-cetylpyridinium (CEPACOL) lozenge 3 mg  1 lozenge Oral PRN Swinteck, Aaron Edelman, MD       Or  . phenol (CHLORASEPTIC) mouth spray 1 spray  1 spray Mouth/Throat PRN Swinteck, Aaron Edelman, MD      . methocarbamol (ROBAXIN) tablet 500 mg  500 mg Oral Q6H PRN Lavina Hamman, MD       Or  . methocarbamol (ROBAXIN) 500 mg in dextrose 5 % 50 mL IVPB  500 mg Intravenous Q6H PRN Lavina Hamman, MD      . metoCLOPramide (REGLAN) tablet 5-10 mg  5-10 mg Oral Q8H PRN Swinteck, Aaron Edelman, MD       Or  . metoCLOPramide (REGLAN) injection 5-10 mg  5-10 mg Intravenous Q8H PRN Swinteck, Aaron Edelman, MD      . morphine 2 MG/ML injection 0.5 mg  0.5 mg Intravenous Q2H PRN Swinteck, Aaron Edelman, MD      . ondansetron [facility]) tablet 4 mg  4 mg Oral Q6H PRN Orvan Falconer, MD       Or  . ondansetron [facility]) injection 4 mg  4 mg Intravenous Q6H PRN Orvan Falconer, MD   4 mg at 03/02/17 1252  . pantoprazole (PROTONIX) EC tablet 40 mg  40 mg Oral Daily Orvan Falconer, MD   40 mg at 03/03/17 1024  . phenytoin (DILANTIN) injection 100 mg  100 mg Intravenous Q8H Lavina Hamman, MD   100 mg at 03/03/17 0515  . polyethylene glycol (MIRALAX / GLYCOLAX) packet 17 g  17 g Oral Daily Lavina Hamman, MD   17 g at 03/03/17 1024  . vitamin B-12 (CYANOCOBALAMIN) tablet 1,000 mcg  1,000  mcg Oral Daily Lavina Hamman, MD   1,000 mcg at 03/03/17 1023     Discharge Medications: Please see discharge summary for a list of discharge medications.  Relevant Imaging Results:  Relevant Lab Results:   Additional Information SSN: [place], [place]

## 2017-03-03 NOTE — Progress Notes (Signed)
MEDICATION RELATED CONSULT NOTE - FOLLOW UP   Pharmacy Consult for Dilantin Indication: new onset seizures  Allergies  Allergen Reactions  . Tape Rash    Please use paper tape    Patient Measurements: Height: 5' 3"  (160 cm) (from 02/26/17 4:18 PM) Weight: 130 lb 1.1 oz (59 kg) (from 02/26/17 4:18 PM) IBW/kg (Calculated) : 52.4  Vital Signs: Temp: 98 F (36.7 C) (06/26 1459) Temp Source: Oral (06/26 1459) BP: 105/38 (06/26 1459) Pulse Rate: 56 (06/26 1459)  Labs:  Recent Labs  03/01/17 0431 03/02/17 0559 03/03/17 0724  WBC 7.1 7.1 9.4  HGB 8.0* 7.4* 9.5*  HCT 24.8* 23.0* 28.4*  PLT 109* 91* 108*  CREATININE 0.89 0.70 0.65  MG 1.8  --   --   ALBUMIN 2.8*  --   --   PROT 5.1*  --   --   AST 31  --   --   ALT 15  --   --   ALKPHOS 50  --   --   BILITOT 0.6  --   --    Estimated Creatinine Clearance: 51 mL/min (by C-G formula based on SCr of 0.65 mg/dL).   Assessment:  Continues on Dilantin 100 mg IV q8hrs, begun on 02/28/17 after 1gm IV loading dose.  No further seizures noted.   Albumin low at 2.8.  Will calculate adjusted Dilantin level when drawn, since highly bound to albumin, so expect higher free Dilantin percentage.  Goal of Therapy:   Dilantin level 10-20 mcg/ml  Plan:   Check steady-state Dilantin level in 2-3 days.  Calculate corrected level, adjusted for low albumin on 2.8.  Arty Baumgartner, [place] Pager: [phone] 03/03/2017,3:23 PM

## 2017-03-03 NOTE — Progress Notes (Signed)
   Subjective:  Patient reports pain as mild to moderate.  States hip feels better  Objective:   VITALS:   Vitals:   03/02/17 1913 03/02/17 2021 03/02/17 2129 03/03/17 0407  BP:  (!) 153/57 (!) 133/55 (!) 136/57  Pulse:  97 94 94  Resp:  17 17 17   Temp:  99.2 F (37.3 C) 99.3 F (37.4 C) 99.1 F (37.3 C)  TempSrc:    Oral  SpO2: 95% 95% 96% 96%  Weight:      Height:        NAD Sensation intact distally Intact pulses distally Incision: dressing C/D/I Compartment soft Has equinovarus contracture with absent DF (same as preop)  Lab Results  Component Value Date   WBC 7.1 03/02/2017   HGB 7.4 (L) 03/02/2017   HCT 23.0 (L) 03/02/2017   MCV 86.1 03/02/2017   PLT 91 (L) 03/02/2017   BMET    Component Value Date/Time   NA 137 03/02/2017 0559   K 3.8 03/02/2017 0559   CL 108 03/02/2017 0559   CO2 21 (L) 03/02/2017 0559   GLUCOSE 114 (H) 03/02/2017 0559   BUN 18 03/02/2017 0559   CREATININE 0.70 03/02/2017 0559   CREATININE 1.12 (H) 12/15/2013 1037   CALCIUM 8.2 (L) 03/02/2017 0559   GFRNONAA >60 03/02/2017 0559   GFRNONAA 50 (L) 12/15/2013 1037   GFRAA >60 03/02/2017 0559   GFRAA 57 (L) 12/15/2013 1037     Assessment/Plan: 1 Day Post-Op   Principal Problem:   Seizure ([place]) Active Problems:   DM type 2 (diabetes mellitus, type 2) (HCC)   Hypercholesteremia   Presence of stent in right coronary artery   HTN (hypertension)   Hepatic cirrhosis (HCC)   PSVT (paroxysmal supraventricular tachycardia) (HCC)   Cerebral infarction (HCC)   Elevated troponin   Hip fracture (HCC)   Closed pertrochanteric fracture of femur, left, initial encounter ([place])   TDWB LLE with walker DVT ppx: lovenox x30 days PO pain control PT/OT Dispo: will need SNF placement   Giovanni Bath, Horald Pollen 03/03/2017, 7:53 AM   Rod Can, MD Cell [phone]

## 2017-03-03 NOTE — Progress Notes (Signed)
Physical Therapy Treatment Patient Details Name: [name] MRN: [id number] DOB: 13-Dec-1940 Today's Date: 03/03/2017    History of Present Illness 76 y.o. female with a history of previous stroke with cortical encephalomalacia, bilateral carotid stenosis, coronary artery disease, hyperlipidemia, PSVT, diabetes mellitus, NAFLD, cirrhosis, and esophageal varices, who presented 02/28/17 with new onset seizures. She was additionally discharged 02/27/17 with SVT and elevated troponin, but then presented to [facility] emergency department with new onset seizures, which apparently were focal in the emergency department. She was loaded with dilantin.  CT head on 02/28/17 was negative for stroke.  MRI brain on 03/01/17 showed multifocal embolic punctate seizures in left precentral gyrus, left corona radiata, and right thalamus.  The patient was also found to have an acute left acute intertrochanteric femur fracture now s/p IM nailing on 6/26.    PT Comments    Pt performed transfer to [facility] with PTA after nurse and nurse tech unable to mobilize patient to the toilet.  Educated patient and staff on transfer techniques.  Will continue PT per POC during hospitalization.     Follow Up Recommendations  SNF     Equipment Recommendations   (TBD)    Recommendations for Other Services       Precautions / Restrictions Precautions Precautions: Fall Precaution Comments: seizure Restrictions Weight Bearing Restrictions: Yes LLE Weight Bearing: Touchdown weight bearing    Mobility  Bed Mobility Overal bed mobility: Needs Assistance Bed Mobility: Supine to Sit     Supine to sit: Max assist;+2 for physical assistance     General bed mobility comments: Pt sitting in chair on arrival  Transfers Overall transfer level: Needs assistance Equipment used: Rolling walker (2 wheeled) Transfers: Sit to/from [facility] Sit to Stand: Mod assist;+2 physical assistance Stand pivot transfers: Max  assist;+2 physical assistance       General transfer comment: Pt able to stand with +2 mod assistance but then required +2 max assist to transfer to [facility] to the R of her recliner.  Pt tends to stop and freeze and requires assistance to turn the RW and shift her weight to sit onto the toilet.  Pt appears fatigued.    Ambulation/Gait Ambulation/Gait assistance:  (Pivot only on RLE unable to take steps.  )               Stairs            Wheelchair Mobility    Modified Rankin (Stroke Patients Only)       Balance Overall balance assessment: Needs assistance Sitting-balance support: No upper extremity supported;Feet supported Sitting balance-Leahy Scale: Poor Sitting balance - Comments: Heavy posterior lean with fuctuating from min-max assist for maintaining balance.    Standing balance support: Bilateral upper extremity supported;During functional activity Standing balance-Leahy Scale: Poor Standing balance comment: +2 physical assistance with B UE support to maintain upright.                             Cognition Arousal/Alertness: Awake/alert Behavior During Therapy: WFL for tasks assessed/performed Overall Cognitive Status: No family/caregiver present to determine baseline cognitive functioning Area of Impairment: Orientation;Problem solving;Awareness                 Orientation Level: Disoriented to;Time         Awareness: Emergent Problem Solving: Slow processing;Decreased initiation;Requires verbal cues General Comments: Some perseveration noted on catching an imaginary "mouse" in her emesis bag.  Exercises      General Comments        Pertinent Vitals/Pain Pain Assessment: 0-10 Pain Score: 7  Pain Location: L hip Pain Descriptors / Indicators: Aching;Operative site guarding;Sore Pain Intervention(s): Monitored during session;Repositioned    Home Living Family/patient expects to be discharged to:: Skilled nursing  facility Living Arrangements: Alone                  Prior Function Level of Independence: Independent      Comments: Pt reports independence with ADL and functional mobility.   PT Goals (current goals can now be found in the care plan section) Acute Rehab PT Goals Patient Stated Goal: to have less pain PT Goal Formulation: With patient Time For Goal Achievement: 03/17/17 Potential to Achieve Goals: Good Progress towards PT goals: Progressing toward goals    Frequency    Min 3X/week      PT Plan Current plan remains appropriate    Co-evaluation PT/OT/SLP Co-Evaluation/Treatment: Yes Reason for Co-Treatment: Complexity of the patient's impairments (multi-system involvement);Necessary to address cognition/behavior during functional activity PT goals addressed during session: Mobility/safety with mobility        AM-PAC PT "6 Clicks" Daily Activity  Outcome Measure  Difficulty turning over in bed (including adjusting bedclothes, sheets and blankets)?: Total Difficulty moving from lying on back to sitting on the side of the bed? : Total Difficulty sitting down on and standing up from a chair with arms (e.g., wheelchair, bedside commode, etc,.)?: Total Help needed moving to and from a bed to chair (including a wheelchair)?: A Lot Help needed walking in hospital room?: Total Help needed climbing 3-5 steps with a railing? : Total 6 Click Score: 7    End of Session Equipment Utilized During Treatment: Gait belt Activity Tolerance: Patient limited by pain Patient left: in chair;with call bell/phone within reach;with chair alarm set Nurse Communication: Mobility status;Weight bearing status PT Visit Diagnosis: Other symptoms and signs involving the nervous system (R29.898);Pain Pain - Right/Left: Left Pain - part of body: Hip     Time: 3013-1438 PT Time Calculation (min) (ACUTE ONLY): 15 min  Charges:  $Therapeutic Activity: 8-22 mins                    G  Codes:       Governor Rooks, PTA pager [phone]    Cristela Blue 03/03/2017, 12:44 PM

## 2017-03-03 NOTE — Evaluation (Signed)
Physical Therapy Evaluation Patient Details Name: [name] MRN: [id number] DOB: [birth year] Today's Date: 03/03/2017   History of Present Illness  76 y.o. female with a history of previous stroke with cortical encephalomalacia, bilateral carotid stenosis, coronary artery disease, hyperlipidemia, PSVT, diabetes mellitus, NAFLD, cirrhosis, and esophageal varices, who presented 02/28/17 with new onset seizures. She was additionally discharged 02/27/17 with SVT and elevated troponin, but then presented to [facility] emergency department with new onset seizures, which apparently were focal in the emergency department. She was loaded with dilantin.  CT head on 02/28/17 was negative for stroke.  MRI brain on 03/01/17 showed multifocal embolic punctate seizures in left precentral gyrus, left corona radiata, and right thalamus.  The patient was also found to have an acute left acute intertrochanteric femur fracture now s/p IM nailing on 6/26.  Clinical Impression  Orders received for PT evaluation. Patient demonstrates deficits in functional mobility as indicated below. Will benefit from continued skilled PT to address deficits and maximize function. Will see as indicated and progress as tolerated.  At this time, patient will need ST SNF upon acute discharge.    Follow Up Recommendations SNF    Equipment Recommendations   (TBD)    Recommendations for Other Services       Precautions / Restrictions Precautions Precautions: Fall Precaution Comments: seizure Restrictions Weight Bearing Restrictions: Yes LLE Weight Bearing: Touchdown weight bearing      Mobility  Bed Mobility Overal bed mobility: Needs Assistance Bed Mobility: Supine to Sit     Supine to sit: Max assist;+2 for physical assistance     General bed mobility comments: Pt able to initiate R LE movement off of bed but max +2 assist for management of L LE and trunk.   Transfers Overall transfer level: Needs assistance Equipment  used: Rolling walker (2 wheeled) Transfers: Sit to/from [facility] Sit to Stand: Mod assist;+2 physical assistance Stand pivot transfers: Mod assist;+2 physical assistance       General transfer comment: Mod +2 assist for power up and lifting with VC's for TWB status of L LE and to maximize use of B UE to support body once standing.   Ambulation/Gait                Stairs            Wheelchair Mobility    Modified Rankin (Stroke Patients Only)       Balance Overall balance assessment: Needs assistance Sitting-balance support: No upper extremity supported;Feet supported Sitting balance-Leahy Scale: Poor Sitting balance - Comments: Heavy posterior lean with fuctuating from min-max assist for maintaining balance.    Standing balance support: Bilateral upper extremity supported;During functional activity Standing balance-Leahy Scale: Poor Standing balance comment: +2 physical assistance with B UE support to maintain upright.                              Pertinent Vitals/Pain Pain Assessment: 0-10 Pain Score: 4  Pain Location: L hip Pain Descriptors / Indicators: Aching;Operative site guarding;Sore    Home Living Family/patient expects to be discharged to:: Skilled nursing facility Living Arrangements: Alone                    Prior Function Level of Independence: Independent         Comments: Pt reports independence with ADL and functional mobility.     Hand Dominance   Dominant Hand: Right  Extremity/Trunk Assessment   Upper Extremity Assessment Upper Extremity Assessment: RUE deficits/detail;Generalized weakness;LUE deficits/detail RUE Deficits / Details: Decreased strength as compared to L with 3+/5 strength.  LUE Deficits / Details: Grossly 4/5 strength    Lower Extremity Assessment Lower Extremity Assessment: LLE deficits/detail LLE: Unable to fully assess due to pain LLE Coordination: decreased fine  motor;decreased gross motor    Cervical / Trunk Assessment Cervical / Trunk Assessment: Kyphotic  Communication   Communication:  (slow to respond)  Cognition Arousal/Alertness: Awake/alert Behavior During Therapy: WFL for tasks assessed/performed Overall Cognitive Status: No family/caregiver present to determine baseline cognitive functioning Area of Impairment: Orientation;Problem solving;Awareness                 Orientation Level: Disoriented to;Time (reports it is August or September)         Awareness: Emergent Problem Solving: Slow processing;Decreased initiation;Requires verbal cues General Comments: Some perseveration noted during tooth brushing activity. Pt requiring increased time for processing but able to attend well to tasks at hand and follow commands appropriately.       General Comments      Exercises     Assessment/Plan    PT Assessment Patient needs continued PT services  PT Problem List Decreased strength;Decreased activity tolerance;Decreased balance;Decreased mobility;Decreased coordination;Decreased safety awareness;Pain       PT Treatment Interventions DME instruction;Gait training;Functional mobility training;Therapeutic activities;Therapeutic exercise;Balance training;Neuromuscular re-education;Patient/family education;Modalities    PT Goals (Current goals can be found in the Care Plan section)  Acute Rehab PT Goals Patient Stated Goal: to have less pain PT Goal Formulation: With patient Time For Goal Achievement: 03/17/17 Potential to Achieve Goals: Good    Frequency Min 3X/week   Barriers to discharge Decreased caregiver support      Co-evaluation PT/OT/SLP Co-Evaluation/Treatment: Yes Reason for Co-Treatment: Complexity of the patient's impairments (multi-system involvement);Necessary to address cognition/behavior during functional activity PT goals addressed during session: Mobility/safety with mobility         AM-PAC PT  "6 Clicks" Daily Activity  Outcome Measure Difficulty turning over in bed (including adjusting bedclothes, sheets and blankets)?: Total Difficulty moving from lying on back to sitting on the side of the bed? : Total Difficulty sitting down on and standing up from a chair with arms (e.g., wheelchair, bedside commode, etc,.)?: Total Help needed moving to and from a bed to chair (including a wheelchair)?: A Lot Help needed walking in hospital room?: Total Help needed climbing 3-5 steps with a railing? : Total 6 Click Score: 7    End of Session Equipment Utilized During Treatment: Gait belt Activity Tolerance: Patient limited by pain Patient left: in chair;with call bell/phone within reach;with chair alarm set Nurse Communication: Mobility status;Weight bearing status PT Visit Diagnosis: Other symptoms and signs involving the nervous system (R29.898);Pain Pain - Right/Left: Left Pain - part of body: Hip    Time: 3794-3276 PT Time Calculation (min) (ACUTE ONLY): 18 min   Charges:   PT Evaluation $PT Eval Moderate Complexity: 1 Procedure     PT G Codes:        Alben Deeds, PT DPT NCS [phone]   Duncan Dull 03/03/2017, 9:51 AM

## 2017-03-03 NOTE — Evaluation (Signed)
Occupational Therapy Evaluation  Patient Details Name: [name] MRN: [id number] DOB: 1941-01-01 Today's Date: 03/03/2017    History of Present Illness 76 y.o. female with a history of previous stroke with cortical encephalomalacia, bilateral carotid stenosis, coronary artery disease, hyperlipidemia, PSVT, diabetes mellitus, NAFLD, cirrhosis, and esophageal varices, who presented 02/28/17 with new onset seizures. She was additionally discharged 02/27/17 with SVT and elevated troponin, but then presented to [facility] emergency department with new onset seizures, which apparently were focal in the emergency department. She was loaded with dilantin.  CT head on 02/28/17 was negative for stroke.  MRI brain on 03/01/17 showed multifocal embolic punctate seizures in left precentral gyrus, left corona radiata, and right thalamus.  The patient was also found to have an acute left acute intertrochanteric femur fracture now s/p IM nailing on 6/26.   Clinical Impression   PTA, pt reports independence with ADL and functional mobility. She currently requires max assist for LB ADL, min assist for UB ADL, and supervision for seated feeding and grooming tasks. She presents with L LE pain s/p femur IM nailing 6/26, B UE weakness (R>L), slow processing, decreased awareness, and perseveration at times all of which are limiting her ability to participate in ADL at [facility]. Pt would benefit from continued OT services while admitted in order to improve safety and independence with ADL and functional mobility. Feel pt would best benefit from short-term SNF placement for continued rehabilitation in order to maximize safety and independence prior to return home. OT will continue to follow while admitted.     Follow Up Recommendations  SNF;Supervision/Assistance - 24 hour    Equipment Recommendations  Other (comment) (TBD at next venue of care)    Recommendations for Other Services       Precautions / Restrictions  Precautions Precautions: Fall Precaution Comments: seizure Restrictions Weight Bearing Restrictions: Yes LLE Weight Bearing: Touchdown weight bearing      Mobility Bed Mobility Overal bed mobility: Needs Assistance Bed Mobility: Supine to Sit     Supine to sit: Max assist;+2 for physical assistance     General bed mobility comments: Pt able to initiate R LE movement off of bed but max +2 assist for management of L LE and trunk.   Transfers Overall transfer level: Needs assistance Equipment used: Rolling walker (2 wheeled) Transfers: Sit to/from [facility] Sit to Stand: Mod assist;+2 physical assistance Stand pivot transfers: Mod assist;+2 physical assistance       General transfer comment: Mod +2 assist for power up and lifting with VC's for TWB status of L LE and to maximize use of B UE to support body once standing.     Balance Overall balance assessment: Needs assistance Sitting-balance support: No upper extremity supported;Feet supported Sitting balance-Leahy Scale: Poor Sitting balance - Comments: Heavy posterior lean with fuctuating from min-max assist for maintaining balance.    Standing balance support: Bilateral upper extremity supported;During functional activity Standing balance-Leahy Scale: Poor Standing balance comment: +2 physical assistance with B UE support to maintain upright.                            ADL either performed or assessed with clinical judgement   ADL Overall ADL's : Needs assistance/impaired Eating/Feeding: Set up;Supervision/ safety;Sitting   Grooming: Supervision/safety;Sitting Grooming Details (indicate cue type and reason): Verbal cues for continuation of tasks due to noted perseveration.  Upper Body Bathing: Sitting;Minimal assistance   Lower Body Bathing: Maximal  assistance;Sit to/from stand   Upper Body Dressing : Sitting;Minimal assistance   Lower Body Dressing: Maximal assistance;Sit to/from  stand   Toilet Transfer: Moderate assistance;Stand-pivot;+2 for physical assistance;BSC;RW Toilet Transfer Details (indicate cue type and reason): Heavy mod assist for lifting and steadying. Verbal cues for pivot technique.  Toileting- Clothing Manipulation and Hygiene: Maximal assistance;Sit to/from stand;+2 for physical assistance       Functional mobility during ADLs: Moderate assistance;+2 for physical assistance (stand-pivot only) General ADL Comments: Pt able to complete grooming tasks with verbal cues for moving from step to step due to some perseveration.      Vision Baseline Vision/History: Wears glasses Wears Glasses: At all times Patient Visual Report: No change from baseline Additional Comments: Need to continue to assess. Able to track therapist and make eye contact during session.      Perception     Praxis      Pertinent Vitals/Pain Pain Assessment: 0-10 Pain Score: 4  Pain Location: L hip Pain Descriptors / Indicators: Aching;Operative site guarding;Sore     Hand Dominance Right   Extremity/Trunk Assessment Upper Extremity Assessment Upper Extremity Assessment: RUE deficits/detail;Generalized weakness;LUE deficits/detail RUE Deficits / Details: Decreased strength as compared to L with 3+/5 strength.  LUE Deficits / Details: Grossly 4/5 strength   Lower Extremity Assessment Lower Extremity Assessment: Defer to PT evaluation LLE: Unable to fully assess due to pain       Communication Communication Communication:  (slow to respond)   Cognition Arousal/Alertness: Awake/alert Behavior During Therapy: WFL for tasks assessed/performed Overall Cognitive Status: No family/caregiver present to determine baseline cognitive functioning Area of Impairment: Orientation;Problem solving;Awareness                 Orientation Level: Disoriented to;Time (reports it is August or September)         Awareness: Emergent Problem Solving: Slow  processing;Decreased initiation;Requires verbal cues General Comments: Some perseveration noted during tooth brushing activity. Pt requiring increased time for processing but able to attend well to tasks at hand and follow commands appropriately.    General Comments       Exercises     Shoulder Instructions      Home Living Family/patient expects to be discharged to:: Skilled nursing facility Living Arrangements: Alone                                      Prior Functioning/Environment Level of Independence: Independent        Comments: Pt reports independence with ADL and functional mobility.        OT Problem List: Decreased strength;Decreased range of motion;Decreased activity tolerance;Impaired balance (sitting and/or standing);Impaired vision/perception;Decreased safety awareness;Decreased cognition;Decreased knowledge of use of DME or AE;Decreased knowledge of precautions;Impaired UE functional use;Pain      OT Treatment/Interventions: Self-care/ADL training;Therapeutic exercise;Energy conservation;DME and/or AE instruction;Therapeutic activities;Patient/family education;Balance training;Cognitive remediation/compensation;Visual/perceptual remediation/compensation    OT Goals(Current goals can be found in the care plan section) Acute Rehab OT Goals Patient Stated Goal: to have less pain OT Goal Formulation: With patient Time For Goal Achievement: 03/17/17 Potential to Achieve Goals: Good ADL Goals Pt Will Perform Grooming: standing;with min assist (3 tasks) Pt Will Perform Upper Body Dressing: with supervision;sitting Pt Will Perform Lower Body Dressing: with adaptive equipment;sit to/from stand;with min guard assist Pt Will Transfer to Toilet: with min guard assist;stand pivot transfer;bedside commode Pt Will Perform Toileting - Clothing Manipulation and hygiene: with  min assist;sit to/from stand Pt/caregiver will Perform Home Exercise Program: Increased  strength;Right Upper extremity;Both right and left upper extremity;With written HEP provided;With Supervision Additional ADL Goal #1: Pt will demonstrate anticipatory awareness during morning ADL routine in a minimally distracting environment independently. Additional ADL Goal #2: Pt will demonstrate improved problem solving skills to complete novel ADL task with no more than 2 verbal cues.   OT Frequency: Min 2X/week   Barriers to D/C:            Co-evaluation              AM-PAC PT "6 Clicks" Daily Activity     Outcome Measure Help from another person eating meals?: A Little Help from another person taking care of personal grooming?: A Little Help from another person toileting, which includes using toliet, bedpan, or urinal?: A Lot Help from another person bathing (including washing, rinsing, drying)?: A Lot Help from another person to put on and taking off regular upper body clothing?: A Little Help from another person to put on and taking off regular lower body clothing?: A Lot 6 Click Score: 15   End of Session Equipment Utilized During Treatment: Gait belt;Rolling walker Nurse Communication: Mobility status  Activity Tolerance: Patient tolerated treatment well Patient left: in chair;with call bell/phone within reach;with chair alarm set  OT Visit Diagnosis: Unsteadiness on feet (R26.81);Other abnormalities of gait and mobility (R26.89);Pain;Other symptoms and signs involving cognitive function;Muscle weakness (generalized) (M62.81) Pain - Right/Left: Left Pain - part of body: Hip                Time: 2725-3664 OT Time Calculation (min): 28 min Charges:  OT General Charges $OT Visit: 1 Procedure OT Evaluation $OT Eval High Complexity: 1 Procedure G-Codes:     Norman Herrlich, MS OTR/L  Pager: [place] Fontanella 03/03/2017, 9:26 AM

## 2017-03-03 NOTE — Progress Notes (Signed)
  Echocardiogram 2D Echocardiogram has been performed.  [name] 03/03/2017, 5:11 PM

## 2017-03-03 NOTE — Progress Notes (Signed)
STROKE TEAM PROGRESS NOTE   HISTORY OF PRESENT ILLNESS (per record) [name] is a 76 y.o. female with a history of previous stroke with cortical encephalomalacia, bilateral carotid stenosis, coronary artery disease, hyperlipidemia, PSVT, diabetes mellitus, NAFLD, cirrhosis, and esophageal varices, who presented 02/28/17 with new onset seizures. She was discharged yesterday 02/27/17 with SVT and elevated troponin, then presented to [facility] emergency department with new onset seizures, which apparently were focal in the emergency department. She was loaded with dilantin.  CT head on 02/28/17 was negative for stroke.  MRI brain on 03/01/17 showed multifocal embolic punctate seizures in left precentral gyrus, left corona radiata, and right thalamus.  The patient was also found to have an acute left acute intertrochanteric femur fracture.  Patient was not administered IV t-PA secondary to being outside of the treatment window. She was admitted to General Neurology for further evaluation and treatment.    SUBJECTIVE (INTERVAL HISTORY) Her RN is at the bedside.She has not had any more seizures and has no new complaints today    OBJECTIVE Temp:  [97.5 F (36.4 C)-99.5 F (37.5 C)] 98 F (36.7 C) (06/26 1459) Pulse Rate:  [56-97] 56 (06/26 1459) Cardiac Rhythm: Normal sinus rhythm;Heart block (06/26 0700) Resp:  [14-19] 16 (06/26 1459) BP: (97-163)/(33-98) 105/38 (06/26 1459) SpO2:  [95 %-100 %] 100 % (06/26 1459) FiO2 (%):  [21 %] 21 % (06/25 1913)  CBC:   Recent Labs Lab 02/28/17 0308 03/01/17 0431 03/02/17 0559 03/03/17 0724  WBC 11.0* 7.1 7.1 9.4  NEUTROABS 7.6 5.0  --   --   HGB 11.8* 8.0* 7.4* 9.5*  HCT 35.7* 24.8* 23.0* 28.4*  MCV 85.6 86.7 86.1 85.3  PLT 231 109* 91* 108*    Basic Metabolic Panel:   Recent Labs Lab 02/28/17 0308 03/01/17 0431 03/02/17 0559 03/03/17 0724  NA 139 139 137 135  K 4.1 3.6 3.8 3.2*  CL 106 110 108 104  CO2 21* 22 21* 23  GLUCOSE 208* 93  114* 137*  BUN 15 13 18 11   CREATININE 1.30* 0.89 0.70 0.65  CALCIUM 9.2 8.3* 8.2* 8.1*  MG 2.0 1.8  --   --     Lipid Panel:     Component Value Date/Time   CHOL 98 03/02/2017 0559   CHOL 142 12/07/2013 1038   TRIG 78 03/02/2017 0559   TRIG 98 12/07/2013 1038   HDL 39 (L) 03/02/2017 0559   HDL 47 12/07/2013 1038   CHOLHDL 2.5 03/02/2017 0559   VLDL 16 03/02/2017 0559   LDLCALC 43 03/02/2017 0559   LDLCALC 75 12/07/2013 1038   HgbA1c:  Lab Results  Component Value Date   HGBA1C 5.7 (H) 03/02/2017   Urine Drug Screen: No results found for: LABOPIA, COCAINSCRNUR, LABBENZ, AMPHETMU, THCU, LABBARB  Alcohol Level     Component Value Date/Time   ETH <11 03/09/2014 1509    IMAGING  Ct Angio Head W/cm &/or Wo Cm 03/01/2017 IMPRESSION: 1. Intracranial atherosclerosis without proximal branch occlusion. Mild bilateral intracranial ICA stenosis and moderate to severe right P2 PCA stenosis. 2. 75% proximal left ICA stenosis. 3. Moderate to severe right and moderate left external carotid artery origin stenosis. 4. 50% proximal left subclavian artery stenosis. 5.  Aortic Atherosclerosis (ICD10-I70.0).   Mr Brain Wo Contrast 03/01/2017 IMPRESSION: 1. Punctate foci of abnormal diffusion signal in the left precentral gyrus, left corona radiata, and right thalamus compatible with acute/subacute embolic infarcts. 2. Chronic right frontal infarct and small vessel ischemic changes  as above.  EEG: 02/28/2017 This normal EEG is recorded in the waking state. There was no seizure or seizure predisposition recorded on this study. Please note that a normal EEG does not preclude the possibility of epilepsy.     PHYSICAL EXAM Pleasant frail elderly lady currently not in distress. . Afebrile. Head is nontraumatic. Neck is supple without bruit.    Cardiac exam no murmur or gallop. Lungs are clear to auscultation. Distal pulses are well felt. Neurological Exam ;  Awake  Alert oriented x 3. Normal  speech and language.eye movements full without nystagmus.fundi were not visualized. Vision acuity and fields appear normal. Hearing is normal. Palatal movements are normal. Face symmetric. Tongue midline. She has mild left hemiparesis with left hand weakness. Left leg weakness is 2/5 at left ankle foot drop and medial rotation. Left leg movements limited due to pain.. Normal sensation. Gait deferred.  ASSESSMENT/PLAN  [name] is a 75 y.o. female with history of multifocal embolic punctate seizures in left precentral gyrus, left corona radiata, and right thalamus presenting with new onset seizures. She did not receive IV t-PA due being outside of the treatment window.   Stroke: multifocal   punctate diffusion-positive lesions following seizures   in left precentral gyrus, left corona radiata, and right thalamus unclear whether postictal findings are small infarcts secondary to extensive atherosclerotic disease.  Resultant  left hemiparesis  CT head: no stroke  CT Angiogram: bilateral intracranial ICA stenosis, severe right P2 PCA stenosis, 75% proximal left ICA stenosis, severe right and moderate left external carotid artery origin stenosis, 50% proximal left subclavian artery stenosis, and aortic atherosclerosis  MRI head:  Punctate foci of abnormal diffusion signal in the left precentral gyrus, left corona radiata, and right thalamus compatible with acute/subacute embolic infarcts. 2. Chronic right frontal infarct and small vessel ischemic  2D Echo  Pending    LDL 43  HgbA1c 5.7  Lovenox 40 mg sq daily for VTE prophylaxis Diet full liquid Room service appropriate? Yes; Fluid consistency: Thin  aspirin 81 mg daily prior to admission, now on aspirin 325 mg daily  Patient counseled to be compliant with her antithrombotic medications  Ongoing aggressive stroke risk factor management  Therapy recommendations:  SNF  Disposition:  SNF   Hypertension  Stable Permissive  hypertension (OK if < 220/120) but gradually normalize in 5-7 days Long-term BP goal normotensive  Hyperlipidemia  Home meds: 35m atorvastatin PO daily, resumed in hospital  LDL 43, goal < 70  Continue statin at discharge  Diabetes  HgbA1c pending  goal < 7.0  Controlled  Other Stroke Risk Factors  Advanced age  Overweight, Body mass index is 23.04 kg/m., recommend weight loss, diet and exercise as appropriate   Hx stroke  Coronary artery disease  Obstructive sleep apnea, on CPAP at home  Other Active Problems  Elevated troponin  Stent in right coronary artery  Seizure, new onset  Hospital day # 3   . She has presented with seizure s likely symptomatic from old stroke and recommend continuing Dilantin for seizure prophylaxis..Marland KitchenMRI findings of diffusion-positive lesions unclear with whether tiny lacunar infarcts versus seizure effect.  . Discussed with Dr. PPosey Pronto Continue aspirin for stroke prevention and Dilantin for seizures. Patient is neurologically cleared for hip   surgery if medically necessary. Avoid hypotension and narcotics pain medications if possible.  Stroke team will sign off. Call for questions. PAntony Contras MD Medical Director MWildwoodPager: [phone]/26/2018 3:07 PM  To contact Stroke Continuity provider, please refer to [email or web address] After hours, contact General Neurology

## 2017-03-04 DIAGNOSIS — I10 Essential (primary) hypertension: Secondary | ICD-10-CM

## 2017-03-04 DIAGNOSIS — I639 Cerebral infarction, unspecified: Secondary | ICD-10-CM

## 2017-03-04 LAB — CBC
HCT: 28.4 % — ABNORMAL LOW (ref 36.0–46.0)
HEMOGLOBIN: 9.3 g/dL — AB (ref 12.0–15.0)
MCH: 27.6 pg (ref 26.0–34.0)
MCHC: 32.7 g/dL (ref 30.0–36.0)
MCV: 84.3 fL (ref 78.0–100.0)
Platelets: 132 10*3/uL — ABNORMAL LOW (ref 150–400)
RBC: 3.37 MIL/uL — ABNORMAL LOW (ref 3.87–5.11)
RDW: 15.5 % (ref 11.5–15.5)
WBC: 8 10*3/uL (ref 4.0–10.5)

## 2017-03-04 LAB — BASIC METABOLIC PANEL
Anion gap: 6 (ref 5–15)
BUN: 9 mg/dL (ref 6–20)
CHLORIDE: 104 mmol/L (ref 101–111)
CO2: 25 mmol/L (ref 22–32)
CREATININE: 0.54 mg/dL (ref 0.44–1.00)
Calcium: 8.1 mg/dL — ABNORMAL LOW (ref 8.9–10.3)
GFR calc Af Amer: >60 mL/min (ref >60)
GFR calc non Af Amer: >60 mL/min (ref >60)
Glucose, Bld: 118 mg/dL — ABNORMAL HIGH (ref 65–99)
Potassium: 3.6 mmol/L (ref 3.5–5.1)
SODIUM: 135 mmol/L (ref 135–145)

## 2017-03-04 MED ORDER — PHENYTOIN SODIUM EXTENDED 100 MG PO CAPS
100.0000 mg | ORAL_CAPSULE | Freq: Three times a day (TID) | ORAL | Status: DC
  Administered 2017-03-04 – 2017-03-05 (×3): 100 mg via ORAL
  Filled 2017-03-04 (×3): qty 1

## 2017-03-04 NOTE — Progress Notes (Signed)
   Subjective:  Patient reports pain as mild to moderate.  States hip feels better  Objective:   VITALS:   Vitals:   03/03/17 1326 03/03/17 1459 03/03/17 2156 03/04/17 0439  BP: (!) 98/44 (!) 105/38 (!) 130/43 (!) 144/44  Pulse:  (!) 56 69 93  Resp:  16 16 16   Temp:  98 F (36.7 C) 98.4 F (36.9 C) 99 F (37.2 C)  TempSrc:  Oral Oral Oral  SpO2:  100% 99% 97%  Weight:      Height:        NAD Sensation intact distally Intact pulses distally Incision: dressing C/D/I Compartment soft Has equinovarus contracture with absent DF (same as preop)  Lab Results  Component Value Date   WBC 9.4 03/03/2017   HGB 9.5 (L) 03/03/2017   HCT 28.4 (L) 03/03/2017   MCV 85.3 03/03/2017   PLT 108 (L) 03/03/2017   BMET    Component Value Date/Time   NA 135 03/03/2017 0724   K 3.2 (L) 03/03/2017 0724   CL 104 03/03/2017 0724   CO2 23 03/03/2017 0724   GLUCOSE 137 (H) 03/03/2017 0724   BUN 11 03/03/2017 0724   CREATININE 0.65 03/03/2017 0724   CREATININE 1.12 (H) 12/15/2013 1037   CALCIUM 8.1 (L) 03/03/2017 0724   GFRNONAA >60 03/03/2017 0724   GFRNONAA 50 (L) 12/15/2013 1037   GFRAA >60 03/03/2017 0724   GFRAA 57 (L) 12/15/2013 1037     Assessment/Plan: 2 Days Post-Op   Principal Problem:   Seizure ([place]) Active Problems:   DM type 2 (diabetes mellitus, type 2) (HCC)   Hypercholesteremia   Presence of stent in right coronary artery   HTN (hypertension)   Hepatic cirrhosis (HCC)   PSVT (paroxysmal supraventricular tachycardia) (HCC)   Cerebral infarction (HCC)   Elevated troponin   Hip fracture (HCC)   Closed pertrochanteric fracture of femur, left, initial encounter ([place])   TDWB LLE with walker DVT ppx: lovenox x30 days PO pain control PT/OT Dispo: will need SNF placement   Audia Amick, Horald Pollen 03/04/2017, 7:50 AM   Rod Can, MD Cell [phone]

## 2017-03-04 NOTE — Evaluation (Signed)
SLP Cancellation Note  Patient Details Name: [name] MRN: [id number] DOB: [birth year]   Cancelled treatment:       Reason Eval/Treat Not Completed: Fatigue/lethargy limiting ability to participate  Pt sound asleep at this time, will continue efforts.   Luanna Salk, [place] [facility] SLP [phone]

## 2017-03-04 NOTE — Progress Notes (Signed)
[facility] PROGRESS NOTE  [name] ACZ:[id number] DOB: 1941-04-23 DOA: 02/28/2017  PCP: Redmond School, MD  Brief History/Interval Summary: 76 year old Caucasian female with a past medical history of stroke, anemia, coronary artery disease, presented with complaints of seizure activity and fall. She was also found to have a stroke. She was found to have left hip fracture and she underwent surgery.  Reason for Visit: Left Fracture. Stroke. Seizure.  Consultants: Neurology. Orthopedic surgery  Procedures:  EEG "Clinical Interpretation: This normal EEG is recorded in the waking state. There was no seizure or seizure predisposition recorded on this study. Please note that a normal EEG does not preclude the possibility of epilepsy."  Transthoracic echocardiogram Study Conclusions  - Left ventricle: The cavity size was normal. Wall thickness was   increased in a pattern of mild LVH. Systolic function was   vigorous. The estimated ejection fraction was in the range of 65%   to 70%. Wall motion was normal; there were no regional wall   motion abnormalities. The study is not technically sufficient to   allow evaluation of LV diastolic function. - Aortic valve: Calcified with restricted leaflet motion. There is   moderate stenosis and mild to moderate AI. Mean gradient (S): 17   mm Hg. Peak gradient (S): 32 mm Hg. Valve area (VTI): 1.48 cm^2.   Valve area (Vmax): 1.45 cm^2. Valve area (Vmean): 1.4 cm^2. - Mitral valve: Heavy MAC - there is mild stenosis. Trivial   regurgitation. Mean gradient (D): 6 mm Hg. - Left atrium: Severely dilated. - Tricuspid valve: There was trivial regurgitation. - Pulmonary arteries: PA peak pressure: 18 mm Hg (S). - Inferior vena cava: The vessel was normal in size. The   respirophasic diameter changes were in the normal range (>= 50%),   consistent with normal central venous pressure. Impressions: - Compared to a prior study in 10/2016, the  LVEF is unchanged. There   is mild to moderate aortic stenosis with mild to moderate AI,   there is heavy MAC with mild MS, the LA is severely dilated.   Antibiotics: None  Subjective/Interval History: Patient feels well. Denies any complaints this morning  ROS: No nausea, vomiting. No shortness of breath  Objective:  Vital Signs  Vitals:   03/03/17 1326 03/03/17 1459 03/03/17 2156 03/04/17 0439  BP: (!) 98/44 (!) 105/38 (!) 130/43 (!) 144/44  Pulse:  (!) 56 69 93  Resp:  _0 Temp:  98 F (36.7 C) 98.4 F (36.9 C) 99 F (37.2 C)  TempSrc:  Oral Oral Oral  SpO2:  100% 99% 97%  Weight:      Height:        Intake/Output Summary (Last 24 hours) at 03/04/17 1330 Last data filed at 03/04/17 1093  Gross per 24 hour  Intake                0 ml  Output              500 ml  Net             -500 ml   Filed Weights   02/28/17 1300  Weight: 59 kg (130 lb 1.1 oz)    General appearance: alert, cooperative, appears stated age and no distress Resp: clear to auscultation bilaterally Cardio: regular rate and rhythm, S1, S2 normal, no murmur, click, rub or gallop GI: soft, non-tender; bowel sounds normal; no masses,  no organomegaly Extremities: Limited range of motion of left hip. No  bruising is noted over the insertion site Neurologic: Awake, alert. Slightly distracted. Oriented to place, year, person. No facial asymmetry. Motor strength equal bilateral upper extremity. Limitation left leg due to hip fracture.  Lab Results:  Data Reviewed: I have personally reviewed following labs and imaging studies  CBC:  Recent Labs Lab 02/28/17 0308 03/01/17 0431 03/02/17 0559 03/03/17 0724 03/04/17 0832  WBC 11.0* 7.1 7.1 9.4 8.0  NEUTROABS 7.6 5.0  --   --   --   HGB 11.8* 8.0* 7.4* 9.5* 9.3*  HCT 35.7* 24.8* 23.0* 28.4* 28.4*  MCV 85.6 86.7 86.1 85.3 84.3  PLT 231 109* 91* 108* 132*    Basic Metabolic Panel:  Recent Labs Lab 02/27/17 0357 02/28/17 0308  03/01/17 0431 03/02/17 0559 03/03/17 0724 03/04/17 0832  NA 136 139 139 137 135 135  K 3.9 4.1 3.6 3.8 3.2* 3.6  CL 109 106 110 108 104 104  CO2 19* 21* 22 21* 23 25  GLUCOSE 107* 208* 93 114* 137* 118*  BUN _0 CREATININE 0.72 1.30* 0.89 0.70 0.65 0.54  CALCIUM 8.8* 9.2 8.3* 8.2* 8.1* 8.1*  MG 1.4* 2.0 1.8  --   --   --     GFR: Estimated Creatinine Clearance: 51 mL/min (by C-G formula based on SCr of 0.54 mg/dL).  Liver Function Tests:  Recent Labs Lab 02/28/17 0308 03/01/17 0431  AST 32 31  ALT 20 15  ALKPHOS 76 50  BILITOT 0.4 0.6  PROT 7.1 5.1*  ALBUMIN 4.0 2.8*     Recent Labs Lab 02/28/17 1029  AMMONIA 15    Coagulation Profile:  Recent Labs Lab 02/26/17 1444 03/01/17 0431  INR 1.16 1.28    Cardiac Enzymes:  Recent Labs Lab 02/26/17 1258 02/26/17 1610 02/26/17 2150 02/27/17 0357  TROPONINI 0.32* 0.35* 0.35* 0.38*    HbA1C:  Recent Labs  03/02/17 0559  HGBA1C 5.7*    CBG:  Recent Labs Lab 02/28/17 0242 03/01/17 0320 03/02/17 0815 03/02/17 1238 03/02/17 1733  GLUCAP 175* 91 123* 115* 128*    Lipid Profile:  Recent Labs  03/02/17 0559  CHOL 98  HDL 39*  LDLCALC 43  TRIG 78  CHOLHDL 2.5     Recent Results (from the past 240 hour(s))  Surgical PCR screen     Status: None   Collection Time: 03/02/17  7:00 AM  Result Value Ref Range Status   MRSA, PCR NEGATIVE NEGATIVE Final   Staphylococcus aureus NEGATIVE NEGATIVE Final    Comment:        The Xpert SA Assay (FDA approved for NASAL specimens in patients over 87 years of age), is one component of a comprehensive surveillance program.  Test performance has been validated by [facility] for patients greater than or equal to 2 year old. It is not intended to diagnose infection nor to guide or monitor treatment.       Radiology Studies: Pelvis Portable  Result Date: 03/02/2017 CLINICAL DATA:  76 year old female post left femur open  reduction and internal fixation. Subsequent encounter. EXAM: PORTABLE PELVIS 1-2 VIEWS COMPARISON:  Intraoperative exam 03/02/2017 and preoperative exam 02/28/2017. FINDINGS: Mild rotation to the left. Left acetabulum/ pelvic wall better delineated on 03/02/2017 femur films. Post left intertrochanteric open reduction and internal fixation without complicating feature noted. Vascular calcifications. IMPRESSION: Post left intertrochanteric fracture reduction.  Please see above. Aortic atherosclerosis. Electronically Signed   By: Genia Del M.D.   On: 03/02/2017 20:27  Dg C-arm 1-60 Min  Result Date: 03/02/2017 CLINICAL DATA:  Hardware fixation of a comminuted left intertrochanteric fracture. EXAM: DG C-ARM 61-120 MIN COMPARISON:  02/28/2017. FINDINGS: C-arm fluoroscopy was provided in the operating room. 80 seconds of fluoroscopy was used. See the left femur C-arm views report from today. IMPRESSION: C-arm fluoroscopy provided in the operating room. Electronically Signed   By: Claudie Revering M.D.   On: 03/02/2017 16:48   Dg Femur Min 2 Views Left  Result Date: 03/02/2017 CLINICAL DATA:  Operative fixation of a left intertrochanteric fracture. EXAM: LEFT FEMUR 2 VIEWS COMPARISON:  Left femur radiographs dated 02/28/2017. FINDINGS: Interval compression screw and rod fixation of the previously demonstrated left intertrochanteric fracture with anatomic position and alignment of the major fragments. No additional fractures or dislocation seen. IMPRESSION: Hardware fixation of the previously demonstrated left intratrochanteric fracture with anatomic position and alignment of the major fragments. Electronically Signed   By: Claudie Revering M.D.   On: 03/02/2017 16:44   Dg Femur Port Min 2 Views Left  Result Date: 03/02/2017 CLINICAL DATA:  Post IM nail EXAM: LEFT FEMUR PORTABLE 2 VIEWS COMPARISON:  02/28/2017 FINDINGS: Internal fixation across the left femoral intertrochanteric fracture. Anatomic alignment. No  hardware or bony complicating feature. IMPRESSION: Internal fixation.  No complicating feature. Electronically Signed   By: Rolm Baptise M.D.   On: 03/02/2017 18:08     Medications:  Scheduled: . aspirin EC  325 mg Oral Daily  . atorvastatin  40 mg Oral QHS  . docusate sodium  100 mg Oral BID  . enoxaparin (LOVENOX) injection  40 mg Subcutaneous Q24H  . ferrous sulfate  325 mg Oral TID PC  . folic acid  1 mg Oral Daily  . pantoprazole  40 mg Oral Daily  . phenytoin  100 mg Oral Q8H  . polyethylene glycol  17 g Oral Daily  . vitamin B-12  1,000 mcg Oral Daily   Continuous:  KAJ:[id number] **OR** acetaminophen, HYDROcodone-acetaminophen, menthol-cetylpyridinium **OR** phenol, methocarbamol **OR** [DISCONTINUED] methocarbamol (ROBAXIN)  IV, ondansetron **OR** ondansetron (ZOFRAN) IV  Assessment/Plan:  Principal Problem:   Seizure ([place]) Active Problems:   DM type 2 (diabetes mellitus, type 2) (HCC)   Hypercholesteremia   Presence of stent in right coronary artery   HTN (hypertension)   Hepatic cirrhosis (HCC)   PSVT (paroxysmal supraventricular tachycardia) (HCC)   Cerebral infarction (HCC)   Elevated troponin   Hip fracture (HCC)   Closed pertrochanteric fracture of femur, left, initial encounter ([place])    Acute seizure Started on Dilantin. Neurology was following. EEG negative for any focus of seizure. MRI brain showed multiple punctate strokes. Dilantin to to be changed to oral today. Outpatient follow-up with neurology.  Acute stroke Findings an MRI thought to be artifact from seizure. Patient is on aspirin, which will be continued. Continue Lipitor. Neurology was consulted for management of stroke.  Left hip fracture Status post surgery. PT and OT.  Acute blood loss anemia. Due to surgery. Hemoglobin stable now. Continue to monitor.  Coronary artery disease/history of SVT/essential hypertension. Stable. Continue home medications. Dose of Cardizem reduced due to  low her blood pressure.  Acute encephalopathy. Most likely secondary to anesthesia effect. Seems to have improved.  Mild thrombocytopenia. Counts have improved. No evidence for bleeding. Continue to monitor.  DVT Prophylaxis: Lovenox    Code Status: Full code  Family Communication: Discussed with the patient's son  Disposition Plan: Patient refuses skilled nursing facility. Patient's son also wants her to go home  instead of skilled nursing facility. Home health will be ordered when ready for discharge.    LOS: 4 days   [facility] Pager [phone] 03/04/2017, 1:30 PM  If 7PM-7AM, please contact night-coverage at [email or web address], password [facility]

## 2017-03-04 NOTE — Care Management Important Message (Signed)
Important Message  Patient Details  Name: [name] MRN: [id number] Date of Birth: [birth year]   Medicare Important Message Given:  Yes    Nathen May 03/04/2017, 11:46 AM

## 2017-03-04 NOTE — Consult Note (Signed)
[facility] CM Inpatient Consult   03/04/2017  Kierstin January 11-Jan-1941 [id number]   Patient was assessed for multiple admissions and recent re-admission in the [place]. Patient admitted with Seizure, fall resulting in a Femur fracture with surgical repair.  Chart reviewed and reveals the patient is desiring to discharge home instead of going to rehab as recommended by inpatient therapy. Spoke with inpatient RNCM, Levada Dy about the patient with frequent admissions and disposition needs.  Met with the patient at the bedside to explain [place] management's role in community case management.  Patient states she wanted to go home and states, "I know I can't go home tomorrow because this leg [touching her left thigh] is not working yet."  Explained to the patient that if the physical therapist is recommending a skilled rehab stay for her healing and mobility needs that she should strongly consider it. Consent form signed for care management, as appropriate to her particular disposition.  Please contact:  [name], RN BSN [facility] Liaison  [phone] business mobile phone Toll free office [phone]

## 2017-03-04 NOTE — Progress Notes (Signed)
CSW received consult regarding PT recommendation of SNF at discharge.  Patient is refusing SNF. She would prefer to go home at discharge. CSW also spoke with patient's son and Verdell Face, [name], who reported that the patient would be more comfortable completing her rehab at home. He lives across the street from the patient and can keep an eye on her with home health. RNCM alerted.  CSW signing off.   Percell Locus Rashidah [place] LCSWA [phone]

## 2017-03-05 ENCOUNTER — Other Ambulatory Visit: Payer: Self-pay | Admitting: Licensed Clinical Social Worker

## 2017-03-05 ENCOUNTER — Encounter (HOSPITAL_COMMUNITY): Payer: Self-pay | Admitting: Orthopedic Surgery

## 2017-03-05 DIAGNOSIS — M25562 Pain in left knee: Secondary | ICD-10-CM | POA: Diagnosis not present

## 2017-03-05 DIAGNOSIS — S72442A Displaced fracture of lower epiphysis (separation) of left femur, initial encounter for closed fracture: Secondary | ICD-10-CM | POA: Diagnosis not present

## 2017-03-05 DIAGNOSIS — S72145D Nondisplaced intertrochanteric fracture of left femur, subsequent encounter for closed fracture with routine healing: Secondary | ICD-10-CM | POA: Diagnosis not present

## 2017-03-05 DIAGNOSIS — D509 Iron deficiency anemia, unspecified: Secondary | ICD-10-CM | POA: Diagnosis not present

## 2017-03-05 DIAGNOSIS — E46 Unspecified protein-calorie malnutrition: Secondary | ICD-10-CM | POA: Diagnosis not present

## 2017-03-05 DIAGNOSIS — S72142D Displaced intertrochanteric fracture of left femur, subsequent encounter for closed fracture with routine healing: Secondary | ICD-10-CM | POA: Diagnosis not present

## 2017-03-05 DIAGNOSIS — M79605 Pain in left leg: Secondary | ICD-10-CM | POA: Diagnosis not present

## 2017-03-05 DIAGNOSIS — Z79899 Other long term (current) drug therapy: Secondary | ICD-10-CM | POA: Diagnosis not present

## 2017-03-05 DIAGNOSIS — S72009A Fracture of unspecified part of neck of unspecified femur, initial encounter for closed fracture: Secondary | ICD-10-CM | POA: Diagnosis not present

## 2017-03-05 DIAGNOSIS — I63019 Cerebral infarction due to thrombosis of unspecified vertebral artery: Secondary | ICD-10-CM | POA: Diagnosis not present

## 2017-03-05 DIAGNOSIS — Z9889 Other specified postprocedural states: Secondary | ICD-10-CM | POA: Diagnosis not present

## 2017-03-05 DIAGNOSIS — T148XXA Other injury of unspecified body region, initial encounter: Secondary | ICD-10-CM | POA: Diagnosis not present

## 2017-03-05 DIAGNOSIS — Z471 Aftercare following joint replacement surgery: Secondary | ICD-10-CM | POA: Diagnosis not present

## 2017-03-05 DIAGNOSIS — S72102D Unspecified trochanteric fracture of left femur, subsequent encounter for closed fracture with routine healing: Secondary | ICD-10-CM | POA: Diagnosis not present

## 2017-03-05 DIAGNOSIS — E559 Vitamin D deficiency, unspecified: Secondary | ICD-10-CM | POA: Diagnosis not present

## 2017-03-05 DIAGNOSIS — E785 Hyperlipidemia, unspecified: Secondary | ICD-10-CM | POA: Diagnosis not present

## 2017-03-05 DIAGNOSIS — M1712 Unilateral primary osteoarthritis, left knee: Secondary | ICD-10-CM | POA: Diagnosis not present

## 2017-03-05 DIAGNOSIS — Z7984 Long term (current) use of oral hypoglycemic drugs: Secondary | ICD-10-CM | POA: Diagnosis not present

## 2017-03-05 DIAGNOSIS — E119 Type 2 diabetes mellitus without complications: Secondary | ICD-10-CM | POA: Diagnosis not present

## 2017-03-05 DIAGNOSIS — D649 Anemia, unspecified: Secondary | ICD-10-CM | POA: Diagnosis not present

## 2017-03-05 DIAGNOSIS — I10 Essential (primary) hypertension: Secondary | ICD-10-CM | POA: Diagnosis not present

## 2017-03-05 DIAGNOSIS — Z9689 Presence of other specified functional implants: Secondary | ICD-10-CM | POA: Diagnosis not present

## 2017-03-05 DIAGNOSIS — R569 Unspecified convulsions: Secondary | ICD-10-CM | POA: Diagnosis not present

## 2017-03-05 DIAGNOSIS — M7989 Other specified soft tissue disorders: Secondary | ICD-10-CM | POA: Diagnosis not present

## 2017-03-05 DIAGNOSIS — E039 Hypothyroidism, unspecified: Secondary | ICD-10-CM | POA: Diagnosis not present

## 2017-03-05 DIAGNOSIS — R6889 Other general symptoms and signs: Secondary | ICD-10-CM | POA: Diagnosis not present

## 2017-03-05 DIAGNOSIS — M6281 Muscle weakness (generalized): Secondary | ICD-10-CM | POA: Diagnosis not present

## 2017-03-05 DIAGNOSIS — I2510 Atherosclerotic heart disease of native coronary artery without angina pectoris: Secondary | ICD-10-CM | POA: Diagnosis not present

## 2017-03-05 DIAGNOSIS — S8292XA Unspecified fracture of left lower leg, initial encounter for closed fracture: Secondary | ICD-10-CM | POA: Diagnosis not present

## 2017-03-05 DIAGNOSIS — Z9181 History of falling: Secondary | ICD-10-CM | POA: Diagnosis not present

## 2017-03-05 DIAGNOSIS — K7460 Unspecified cirrhosis of liver: Secondary | ICD-10-CM | POA: Diagnosis not present

## 2017-03-05 LAB — BASIC METABOLIC PANEL
ANION GAP: 8 (ref 5–15)
BUN: 14 mg/dL (ref 6–20)
CALCIUM: 8 mg/dL — AB (ref 8.9–10.3)
CO2: 25 mmol/L (ref 22–32)
CREATININE: 0.65 mg/dL (ref 0.44–1.00)
Chloride: 103 mmol/L (ref 101–111)
GLUCOSE: 97 mg/dL (ref 65–99)
Potassium: 3.6 mmol/L (ref 3.5–5.1)
Sodium: 136 mmol/L (ref 135–145)

## 2017-03-05 LAB — CBC
HCT: 27.6 % — ABNORMAL LOW (ref 36.0–46.0)
Hemoglobin: 8.9 g/dL — ABNORMAL LOW (ref 12.0–15.0)
MCH: 28 pg (ref 26.0–34.0)
MCHC: 32.2 g/dL (ref 30.0–36.0)
MCV: 86.8 fL (ref 78.0–100.0)
PLATELETS: 165 10*3/uL (ref 150–400)
RBC: 3.18 MIL/uL — ABNORMAL LOW (ref 3.87–5.11)
RDW: 16 % — AB (ref 11.5–15.5)
WBC: 7.9 10*3/uL (ref 4.0–10.5)

## 2017-03-05 LAB — PHENYTOIN LEVEL, TOTAL: Phenytoin Lvl: 13.3 ug/mL (ref 10.0–20.0)

## 2017-03-05 MED ORDER — CYANOCOBALAMIN 1000 MCG PO TABS: 1000.0000 ug | ORAL_TABLET | Freq: Every day | ORAL | Status: DC

## 2017-03-05 MED ORDER — POLYETHYLENE GLYCOL 3350 17 G PO PACK: 17.0000 g | PACK | Freq: Every day | ORAL | 0 refills | Status: DC

## 2017-03-05 MED ORDER — HYDROCODONE-ACETAMINOPHEN 5-325 MG PO TABS: 1.0000 | ORAL_TABLET | Freq: Four times a day (QID) | ORAL | 0 refills | Status: DC | PRN

## 2017-03-05 MED ORDER — PHENYTOIN SODIUM EXTENDED 300 MG PO CAPS: 300.0000 mg | ORAL_CAPSULE | Freq: Every day | ORAL | Status: DC

## 2017-03-05 MED ORDER — FERROUS SULFATE 325 (65 FE) MG PO TABS: 325.0000 mg | ORAL_TABLET | Freq: Three times a day (TID) | ORAL | 3 refills | Status: DC

## 2017-03-05 MED ORDER — DOCUSATE SODIUM 100 MG PO CAPS: 100.0000 mg | ORAL_CAPSULE | Freq: Two times a day (BID) | ORAL | 0 refills | Status: DC

## 2017-03-05 MED ORDER — CARVEDILOL 12.5 MG PO TABS: 12.5000 mg | ORAL_TABLET | Freq: Two times a day (BID) | ORAL | Status: DC

## 2017-03-05 MED ORDER — METHOCARBAMOL 500 MG PO TABS: 500.0000 mg | ORAL_TABLET | Freq: Four times a day (QID) | ORAL | 0 refills | Status: DC | PRN

## 2017-03-05 MED ORDER — FOLIC ACID 1 MG PO TABS: 1.0000 mg | ORAL_TABLET | Freq: Every day | ORAL | Status: DC

## 2017-03-05 MED ORDER — ENOXAPARIN SODIUM 40 MG/0.4ML ~~LOC~~ SOLN: 40.0000 mg | SUBCUTANEOUS | 0 refills | Status: DC

## 2017-03-05 NOTE — Progress Notes (Signed)
MEDICATION RELATED CONSULT NOTE - FOLLOW UP  Pharmacy Consult for Dilantin Indication: seizures  Allergies  Allergen Reactions  . Tape Rash    Please use paper tape    Patient Measurements: Height: 5' 3"  (160 cm) (from 02/26/17 4:18 PM) Weight: 130 lb 1.1 oz (59 kg) (from 02/26/17 4:18 PM) IBW/kg (Calculated) : 52.4  Vital Signs: Temp: 98.2 F (36.8 C) (06/28 0627) Temp Source: Oral (06/28 0627) BP: 139/47 (06/28 0627) Pulse Rate: 81 (06/28 0627) Intake/Output from previous day: 06/27 0701 - 06/28 0700 In: 240 [P.O.:240] Out: 350 [Urine:350] Intake/Output from this shift: No intake/output data recorded.  Labs:  Recent Labs  03/03/17 0724 03/04/17 0832 03/05/17 0538  WBC 9.4 8.0 7.9  HGB 9.5* 9.3* 8.9*  HCT 28.4* 28.4* 27.6*  PLT 108* 132* 165  CREATININE 0.65 0.54 0.65   Estimated Creatinine Clearance: 51 mL/min (by C-G formula based on SCr of 0.65 mg/dL).   Microbiology: Recent Results (from the past 720 hour(s))  Surgical PCR screen     Status: None   Collection Time: 03/02/17  7:00 AM  Result Value Ref Range Status   MRSA, PCR NEGATIVE NEGATIVE Final   Staphylococcus aureus NEGATIVE NEGATIVE Final    Comment:        The Xpert SA Assay (FDA approved for NASAL specimens in patients over 44 years of age), is one component of a comprehensive surveillance program.  Test performance has been validated by [facility] for patients greater than or equal to 41 year old. It is not intended to diagnose infection nor to guide or monitor treatment.     Medications:  Scheduled:  . aspirin EC  325 mg Oral Daily  . atorvastatin  40 mg Oral QHS  . docusate sodium  100 mg Oral BID  . enoxaparin (LOVENOX) injection  40 mg Subcutaneous Q24H  . ferrous sulfate  325 mg Oral TID PC  . folic acid  1 mg Oral Daily  . pantoprazole  40 mg Oral Daily  . phenytoin  100 mg Oral Q8H  . polyethylene glycol  17 g Oral Daily  . vitamin B-12  1,000 mcg Oral Daily     Assessment: 76 yo F admitted with new onset seizures and started on dilantin.    03/05/2017 Dilantin level =13.3, Albumin 2.8, corrected Dilantin level = 20     Goal of Therapy: goal dilantin level = 10-20  Plan:  Continue dilantin 122m PO q8h >> MD requested changing dose to 3017mQHS for ease of administration >> agree.  Ki[employer]power IncPharm.D., BCPS Clinical Pharmacist Pager: [phone] phone for 03/05/2017 from 8:30-4:00 is [phone]. After 4pm, please call Main Rx [phone]) for assistance. 03/05/2017 11:35 AM

## 2017-03-05 NOTE — Progress Notes (Signed)
   Subjective:  Patient reports pain as mild to moderate.  States hip feels better.  Objective:   VITALS:   Vitals:   03/04/17 0439 03/04/17 1545 03/04/17 2123 03/05/17 0627  BP: (!) 144/44 (!) 130/47 (!) 137/47 (!) 139/47  Pulse: 93 90 88 81  Resp: 16 20 18 20   Temp: 99 F (37.2 C)  98.7 F (37.1 C) 98.2 F (36.8 C)  TempSrc: Oral  Oral Oral  SpO2: 97% 98% 98% 100%  Weight:      Height:        NAD Sensation intact distally Intact pulses distally Incision: dressing C/D/I Compartment soft Has equinovarus contracture with absent DF (same as preop)   Lab Results  Component Value Date   WBC 7.9 03/05/2017   HGB 8.9 (L) 03/05/2017   HCT 27.6 (L) 03/05/2017   MCV 86.8 03/05/2017   PLT 165 03/05/2017   BMET    Component Value Date/Time   NA 136 03/05/2017 0538   K 3.6 03/05/2017 0538   CL 103 03/05/2017 0538   CO2 25 03/05/2017 0538   GLUCOSE 97 03/05/2017 0538   BUN 14 03/05/2017 0538   CREATININE 0.65 03/05/2017 0538   CREATININE 1.12 (H) 12/15/2013 1037   CALCIUM 8.0 (L) 03/05/2017 0538   GFRNONAA >60 03/05/2017 0538   GFRNONAA 50 (L) 12/15/2013 1037   GFRAA >60 03/05/2017 0538   GFRAA 57 (L) 12/15/2013 1037     Assessment/Plan: 3 Days Post-Op   Principal Problem:   Seizure ([place]) Active Problems:   DM type 2 (diabetes mellitus, type 2) (HCC)   Hypercholesteremia   Presence of stent in right coronary artery   HTN (hypertension)   Hepatic cirrhosis (HCC)   PSVT (paroxysmal supraventricular tachycardia) (HCC)   Cerebral infarction (HCC)   Elevated troponin   Hip fracture (HCC)   Closed pertrochanteric fracture of femur, left, initial encounter ([place])   TDWB LLE with walker DVT ppx: lovenox x30 days PO pain control PT/OT Dispo: ok for d/c from ortho standpoint   Tyre Beaver, Horald Pollen 03/05/2017, 7:35 AM   Rod Can, MD Cell [phone]

## 2017-03-05 NOTE — Discharge Summary (Signed)
[facility]  Physician Discharge Summary   Patient ID: [name] MRN: [id number] DOB/AGE: 1941-04-16 76 y.o.  Admit date: 02/28/2017 Discharge date: 03/05/2017  PCP: Redmond School, MD  DISCHARGE DIAGNOSES:  Principal Problem:   Seizure [facility]) Active Problems:   DM type 2 (diabetes mellitus, type 2) ([place])   Hypercholesteremia   Presence of stent in right coronary artery   HTN (hypertension)   Hepatic cirrhosis (HCC)   PSVT (paroxysmal supraventricular tachycardia) (HCC)   Cerebral infarction (HCC)   Elevated troponin   Hip fracture (HCC)   Closed pertrochanteric fracture of femur, left, initial encounter ([place])   RECOMMENDATIONS FOR OUTPATIENT FOLLOW UP: 1. CBC and basic metabolic panel and Dilantin level early next week 2. Outpatient follow-up with neurology. Referral has been sent to [facility].   DISCHARGE CONDITION: fair  Diet recommendation: Heart healthy  Filed Weights   02/28/17 1300  Weight: 59 kg (130 lb 1.1 oz)    INITIAL HISTORY: 76 year old Caucasian female with a past medical history of stroke, anemia, coronary artery disease, presented with complaints of seizure activity and fall. She was also found to have a stroke. She was found to have left hip fracture and she underwent surgery.  Consultants: Neurology. Orthopedic surgery  Procedures:  EEG "Clinical Interpretation: This normal EEG is recorded in the waking state. There was no seizure or seizure predisposition recorded on this study. Please note that a normal EEG does not preclude the possibility of epilepsy."  Transthoracic echocardiogram Study Conclusions  - Left ventricle: The cavity size was normal. Wall thickness was increased in a pattern of mild LVH. Systolic function was vigorous. The estimated ejection fraction was in the range of 65% to 70%. Wall motion was normal; there were no regional wall motion abnormalities. The study is not  technically sufficient to allow evaluation of LV diastolic function. - Aortic valve: Calcified with restricted leaflet motion. There is moderate stenosis and mild to moderate AI. Mean gradient (S): 17 mm Hg. Peak gradient (S): 32 mm Hg. Valve area (VTI): 1.48 cm^2. Valve area (Vmax): 1.45 cm^2. Valve area (Vmean): 1.4 cm^2. - Mitral valve: Heavy MAC - there is mild stenosis. Trivial regurgitation. Mean gradient (D): 6 mm Hg. - Left atrium: Severely dilated. - Tricuspid valve: There was trivial regurgitation. - Pulmonary arteries: PA peak pressure: 18 mm Hg (S). - Inferior vena cava: The vessel was normal in size. The respirophasic diameter changes were in the normal range (>= 50%), consistent with normal central venous pressure. Impressions: - Compared to a prior study in 10/2016, the LVEF is unchanged. There is mild to moderate aortic stenosis with mild to moderate AI, there is heavy MAC with mild MS, the LA is severely dilated.   HOSPITAL COURSE:   Acute seizure Patient was seen by neurology and started on Dilantin. EEG was negative for any focus of seizures. MRI brain showed multiple punctate strokes. Dilantin changed to oral , and to once a day dosing. Dilantin level this morning is 13.3. Albumin is low. Corrected level is 20. Would recommend repeating Dilantin level in one week. Outpatient follow-up with neurology.  Suspected Acute stroke Findings an MRI thought to be artifact from seizure per neurology. Patient is on aspirin, which will be continued. Continue Lipitor.   Left hip fracture She underwent surgery after she was cleared by neurology. Seems to be doing well postoperatively. Mild anemia noted, but hemoglobin is stable. Lovenox recommended for DVT prophylaxis.  Acute blood loss anemia. Due to surgery. Hemoglobin stable  now. Repeat labs at skilled nursing facility.  Coronary artery disease/history of SVT/essential hypertension. Patient's cardiac  status has been stable. Blood pressure was low initially and so some of her medications were held. There was some watershed component to her stroke. Neurology is recommending avoiding low blood pressure. In view of this, her antihypertensive medication doses have been adjusted. We have discontinued her Cardizem, lisinopril, isosorbide. Carvedilol dose has been reduced. Depending on her blood pressures at the skilled nursing facility, would first increase the dose of carvedilol as long as heart rate tolerates. Reintroduce other medications only if she remains hypertensive.  Acute encephalopathy. Most likely secondary to anesthesia effect. Seems to have improved.  Mild thrombocytopenia. Counts have improved. No evidence for bleeding.   Overall, stable. Okay for discharge to skilled nursing facility today.     PERTINENT LABS:  The results of significant diagnostics from this hospitalization (including imaging, microbiology, ancillary and laboratory) are listed below for reference.    Microbiology: Recent Results (from the past 240 hour(s))  Surgical PCR screen     Status: None   Collection Time: 03/02/17  7:00 AM  Result Value Ref Range Status   MRSA, PCR NEGATIVE NEGATIVE Final   Staphylococcus aureus NEGATIVE NEGATIVE Final    Comment:        The Xpert SA Assay (FDA approved for NASAL specimens in patients over 40 years of age), is one component of a comprehensive surveillance program.  Test performance has been validated by [facility] for patients greater than or equal to 1 year old. It is not intended to diagnose infection nor to guide or monitor treatment.      Labs: Basic Metabolic Panel:  Recent Labs Lab 02/27/17 0357 02/28/17 0308 03/01/17 0431 03/02/17 0559 03/03/17 0724 03/04/17 0832 03/05/17 0538  NA 136 139 139 137 135 135 136  K 3.9 4.1 3.6 3.8 3.2* 3.6 3.6  CL 109 106 110 108 104 104 103  CO2 19* 21* 22 21* _0 GLUCOSE 107* 208* 93 114*  137* 118* 97  BUN _1 CREATININE 0.72 1.30* 0.89 0.70 0.65 0.54 0.65  CALCIUM 8.8* 9.2 8.3* 8.2* 8.1* 8.1* 8.0*  MG 1.4* 2.0 1.8  --   --   --   --    Liver Function Tests:  Recent Labs Lab 02/28/17 0308 03/01/17 0431  AST 32 31  ALT 20 15  ALKPHOS 76 50  BILITOT 0.4 0.6  PROT 7.1 5.1*  ALBUMIN 4.0 2.8*    Recent Labs Lab 02/28/17 1029  AMMONIA 15   CBC:  Recent Labs Lab 02/28/17 0308 03/01/17 0431 03/02/17 0559 03/03/17 0724 03/04/17 0832 03/05/17 0538  WBC 11.0* 7.1 7.1 9.4 8.0 7.9  NEUTROABS 7.6 5.0  --   --   --   --   HGB 11.8* 8.0* 7.4* 9.5* 9.3* 8.9*  HCT 35.7* 24.8* 23.0* 28.4* 28.4* 27.6*  MCV 85.6 86.7 86.1 85.3 84.3 86.8  PLT 231 109* 91* 108* 132* 165   Cardiac Enzymes:  Recent Labs Lab 02/26/17 1258 02/26/17 1610 02/26/17 2150 02/27/17 0357  TROPONINI 0.32* 0.35* 0.35* 0.38*    CBG:  Recent Labs Lab 02/28/17 0242 03/01/17 0320 03/02/17 0815 03/02/17 1238 03/02/17 1733  GLUCAP 175* 91 123* 115* 128*     IMAGING STUDIES Ct Angio Head W/cm &/or Wo Cm  Result Date: 03/01/2017 CLINICAL DATA:  Small bilateral embolic infarcts on MRI earlier today. New onset seizures. EXAM: CT  ANGIOGRAPHY HEAD AND NECK TECHNIQUE: Multidetector CT imaging of the head and neck was performed using the standard protocol during bolus administration of intravenous contrast. Multiplanar CT image reconstructions and MIPs were obtained to evaluate the vascular anatomy. Carotid stenosis measurements (when applicable) are obtained utilizing NASCET criteria, using the distal internal carotid diameter as the denominator. CONTRAST:  50 mL Isovue 370 COMPARISON:  Head MRI earlier today. Head MRA 05/05/2016. Carotid Doppler 04/10/2016. FINDINGS: CT HEAD FINDINGS Brain: The tiny bilateral embolic infarcts on today's earlier MRI are not well seen by CT. A chronic posterior right frontal infarct is again noted in the operculum. There are dilated perivascular  spaces versus chronic lacunar infarcts in the basal ganglia bilaterally. Chronic infarcts are also noted in the right cerebellum and left thalamus. No acute large territory infarct, intracranial hemorrhage, mass, midline shift, or extra-axial fluid collection is identified. Mild cerebral atrophy and mild chronic small vessel ischemic white matter disease are noted. Vascular: Calcified atherosclerosis at the skullbase. Skull: No fracture focal osseous lesion. Sinuses: Paranasal sinuses and mastoid air cells are clear. Orbits: Bilateral cataract extraction. Review of the MIP images confirms the above findings CTA NECK FINDINGS Aortic arch: Standard 3 vessel aortic arch with moderate atherosclerotic plaque. Widely patent brachiocephalic and right subclavian arteries. Approximately 50% stenosis of the left subclavian artery at its origin due to calcified and soft plaque. Right carotid system: Patent with moderate calcified plaque at the carotid bifurcation resulting in moderate to severe ECA origin stenosis. No significant common or internal carotid artery stenosis. Left carotid system: Mild calcified plaque at the common carotid artery origin without definite significant stenosis identified within limitations of motion artifact. Moderate calcified plaque at the carotid bifurcation results in approximately 75% ICA origin stenosis. There is also moderate ECA origin stenosis. Vertebral arteries: Patent and codominant without evidence of significant stenosis. Skeleton: Advanced cervical disc and facet degeneration. Grade 1 retrolisthesis of C3 on C4 and C5 on C6 and grade 1 anterolisthesis of C4 on C5 and C7 on T1. Other neck: Partially calcified 1 cm left thyroid nodule. Upper chest: Clear lung apices. Review of the MIP images confirms the above findings CTA HEAD FINDINGS Anterior circulation: The internal carotid arteries are patent from skullbase to carotid termini with relatively diffuse siphon atherosclerosis. There  is mild supraclinoid stenosis bilaterally. ACAs and MCAs are patent without evidence of proximal branch occlusion or significant proximal stenosis. No aneurysm. Posterior circulation: Intracranial vertebral arteries are patent to the basilar. There is left greater than right V4 segment atherosclerotic plaque which results in mild stenosis on the left. Patent bilateral PICAs and SCAs. Basilar artery is patent without stenosis. Posterior communicating arteries are not identified. PCAs are patent with a moderate to severe P2 stenosis noted on the right, not significantly changed from the prior MRA. No significant proximal left PCA stenosis. No aneurysm. Venous sinuses: Patent. Anatomic variants: None. Delayed phase: No abnormal enhancement. Review of the MIP images confirms the above findings IMPRESSION: 1. Intracranial atherosclerosis without proximal branch occlusion. Mild bilateral intracranial ICA stenosis and moderate to severe right P2 PCA stenosis. 2. 75% proximal left ICA stenosis. 3. Moderate to severe right and moderate left external carotid artery origin stenosis. 4. 50% proximal left subclavian artery stenosis. 5.  Aortic Atherosclerosis (ICD10-I70.0). Electronically Signed   By: Logan Bores M.D.   On: 03/01/2017 16:19   Dg Chest 1 View  Result Date: 02/28/2017 CLINICAL DATA:  Seizures, pain. EXAM: PELVIS - 1-2 VIEW; CHEST  1 VIEW COMPARISON:  CT abdomen and pelvis July 09, 2015 FINDINGS: Chest: Cardiac silhouette is mildly enlarged, calcified aortic knob. No pleural effusion or focal consolidation. Elevated LEFT hemidiaphragm with strandy densities. No pneumothorax. Soft tissue planes and included osseous structures are nonsuspicious. Pelvis: Acute comminuted LEFT femur intertrochanteric fracture with impaction. Varus angulation of the distal bony fragments. Femoral heads are located. Osteopenia without destructive bony lesions. Moderate vascular calcifications. IMPRESSION: Chest: Mild  cardiomegaly, LEFT lung base atelectasis. Pelvis: Acute LEFT femur intertrochanteric fracture. Electronically Signed   By: Elon Alas M.D.   On: 02/28/2017 04:11   Dg Chest 2 View  Result Date: 02/26/2017 CLINICAL DATA:  CENTER CHEST PAIN SINCE LAST NIGHT, HX OF MI X 2, MOST RECENT 2 YEARS AGO, FORMER SMOKER X 4 YEARS EXAM: CHEST  2 VIEW COMPARISON:  12/07/2016 FINDINGS: Lungs are hyperinflated. Heart size is normal. There are no focal consolidations or pleural effusions. No pulmonary edema. Degenerative changes are seen in thoracic spine. IMPRESSION: No active cardiopulmonary disease. Electronically Signed   By: Nolon Nations M.D.   On: 02/26/2017 12:43   Dg Pelvis 1-2 Views  Result Date: 02/28/2017 CLINICAL DATA:  Seizures, pain. EXAM: PELVIS - 1-2 VIEW; CHEST  1 VIEW COMPARISON:  CT abdomen and pelvis July 09, 2015 FINDINGS: Chest: Cardiac silhouette is mildly enlarged, calcified aortic knob. No pleural effusion or focal consolidation. Elevated LEFT hemidiaphragm with strandy densities. No pneumothorax. Soft tissue planes and included osseous structures are nonsuspicious. Pelvis: Acute comminuted LEFT femur intertrochanteric fracture with impaction. Varus angulation of the distal bony fragments. Femoral heads are located. Osteopenia without destructive bony lesions. Moderate vascular calcifications. IMPRESSION: Chest: Mild cardiomegaly, LEFT lung base atelectasis. Pelvis: Acute LEFT femur intertrochanteric fracture. Electronically Signed   By: Elon Alas M.D.   On: 02/28/2017 04:11   Dg Ankle 2 Views Left  Result Date: 02/28/2017 CLINICAL DATA:  Seizure.  No additional history provided. EXAM: LEFT ANKLE - 2 VIEW COMPARISON:  None. FINDINGS: There is no evidence of fracture, dislocation, or joint effusion. Ankle mortise is preserved. There is a small plantar calcaneal spur. No bony destructive change or significant arthropathy. There is diffuse soft tissue edema. IMPRESSION: Diffuse  soft tissue edema.  No acute osseous abnormality. Electronically Signed   By: Jeb Levering M.D.   On: 02/28/2017 04:10   Ct Head Wo Contrast  Result Date: 02/28/2017 CLINICAL DATA:  76 year old female with seizures. EXAM: CT HEAD WITHOUT CONTRAST TECHNIQUE: Contiguous axial images were obtained from the base of the skull through the vertex without intravenous contrast. COMPARISON:  Brain MRI dated 05/05/2016 FINDINGS: Brain: There is moderate brain atrophy and chronic microvascular ischemic changes. An area of old infarct and encephalomalacia noted in the right frontal lobe. Small bilateral basal ganglia old lacunar infarct noted. There is no acute intracranial hemorrhage. No mass effect or midline shift noted. No extra-axial fluid collection. Vascular: No hyperdense vessel or unexpected calcification. Skull: Normal. Negative for fracture or focal lesion. Sinuses/Orbits: No acute finding. Other: None IMPRESSION: 1. No acute intracranial hemorrhage. 2. Moderate age-related atrophy and chronic microvascular ischemic changes. Old right frontal infarct. Electronically Signed   By: Anner Crete M.D.   On: 02/28/2017 03:38   Ct Angio Neck W/cm &/or Wo/cm  Result Date: 03/01/2017 CLINICAL DATA:  Small bilateral embolic infarcts on MRI earlier today. New onset seizures. EXAM: CT ANGIOGRAPHY HEAD AND NECK TECHNIQUE: Multidetector CT imaging of the head and neck was performed using the standard protocol during bolus administration of intravenous contrast. Multiplanar CT  image reconstructions and MIPs were obtained to evaluate the vascular anatomy. Carotid stenosis measurements (when applicable) are obtained utilizing NASCET criteria, using the distal internal carotid diameter as the denominator. CONTRAST:  50 mL Isovue 370 COMPARISON:  Head MRI earlier today. Head MRA 05/05/2016. Carotid Doppler 04/10/2016. FINDINGS: CT HEAD FINDINGS Brain: The tiny bilateral embolic infarcts on today's earlier MRI are not  well seen by CT. A chronic posterior right frontal infarct is again noted in the operculum. There are dilated perivascular spaces versus chronic lacunar infarcts in the basal ganglia bilaterally. Chronic infarcts are also noted in the right cerebellum and left thalamus. No acute large territory infarct, intracranial hemorrhage, mass, midline shift, or extra-axial fluid collection is identified. Mild cerebral atrophy and mild chronic small vessel ischemic white matter disease are noted. Vascular: Calcified atherosclerosis at the skullbase. Skull: No fracture focal osseous lesion. Sinuses: Paranasal sinuses and mastoid air cells are clear. Orbits: Bilateral cataract extraction. Review of the MIP images confirms the above findings CTA NECK FINDINGS Aortic arch: Standard 3 vessel aortic arch with moderate atherosclerotic plaque. Widely patent brachiocephalic and right subclavian arteries. Approximately 50% stenosis of the left subclavian artery at its origin due to calcified and soft plaque. Right carotid system: Patent with moderate calcified plaque at the carotid bifurcation resulting in moderate to severe ECA origin stenosis. No significant common or internal carotid artery stenosis. Left carotid system: Mild calcified plaque at the common carotid artery origin without definite significant stenosis identified within limitations of motion artifact. Moderate calcified plaque at the carotid bifurcation results in approximately 75% ICA origin stenosis. There is also moderate ECA origin stenosis. Vertebral arteries: Patent and codominant without evidence of significant stenosis. Skeleton: Advanced cervical disc and facet degeneration. Grade 1 retrolisthesis of C3 on C4 and C5 on C6 and grade 1 anterolisthesis of C4 on C5 and C7 on T1. Other neck: Partially calcified 1 cm left thyroid nodule. Upper chest: Clear lung apices. Review of the MIP images confirms the above findings CTA HEAD FINDINGS Anterior circulation: The  internal carotid arteries are patent from skullbase to carotid termini with relatively diffuse siphon atherosclerosis. There is mild supraclinoid stenosis bilaterally. ACAs and MCAs are patent without evidence of proximal branch occlusion or significant proximal stenosis. No aneurysm. Posterior circulation: Intracranial vertebral arteries are patent to the basilar. There is left greater than right V4 segment atherosclerotic plaque which results in mild stenosis on the left. Patent bilateral PICAs and SCAs. Basilar artery is patent without stenosis. Posterior communicating arteries are not identified. PCAs are patent with a moderate to severe P2 stenosis noted on the right, not significantly changed from the prior MRA. No significant proximal left PCA stenosis. No aneurysm. Venous sinuses: Patent. Anatomic variants: None. Delayed phase: No abnormal enhancement. Review of the MIP images confirms the above findings IMPRESSION: 1. Intracranial atherosclerosis without proximal branch occlusion. Mild bilateral intracranial ICA stenosis and moderate to severe right P2 PCA stenosis. 2. 75% proximal left ICA stenosis. 3. Moderate to severe right and moderate left external carotid artery origin stenosis. 4. 50% proximal left subclavian artery stenosis. 5.  Aortic Atherosclerosis (ICD10-I70.0). Electronically Signed   By: Logan Bores M.D.   On: 03/01/2017 16:19   Mr Brain Wo Contrast  Result Date: 03/01/2017 CLINICAL DATA:  New onset seizures.  Mild left hemiparesis. EXAM: MRI HEAD WITHOUT CONTRAST TECHNIQUE: Multiplanar, multiecho pulse sequences of the brain and surrounding structures were obtained without intravenous contrast. COMPARISON:  Head CT 02/28/2017 and MRI 05/05/2016 FINDINGS: Brain: Punctate  foci of hyperintense diffusion weighted signal are present in the ventral right thalamus, posterior left corona radiata, and left precentral gyrus. Reduced ADC cannot be confirmed due to their small size. There is no  acute large territory infarct. A chronic right MCA infarct is again noted involving the frontal operculum and insula. Small chronic infarcts are again seen in the right cerebellum and left thalamus. Chronic lacunar infarcts versus dilated perivascular spaces are present in the basal ganglia bilaterally. Small T2 hyperintensities scattered elsewhere in the cerebral white matter and pons are unchanged from the prior MRI and nonspecific but compatible with mild chronic small vessel ischemic disease. No intracranial hemorrhage, mass, midline shift, or extra-axial fluid collection is identified. There is mild cerebral atrophy. Vascular: Major intracranial vascular flow voids are preserved. Skull and upper cervical spine: Unremarkable bone marrow signal. Sinuses/Orbits: Bilateral cataract extraction. Clear paranasal sinuses. Small bilateral mastoid effusions. Other: None. IMPRESSION: 1. Punctate foci of abnormal diffusion signal in the left precentral gyrus, left corona radiata, and right thalamus compatible with acute/subacute embolic infarcts. 2. Chronic right frontal infarct and small vessel ischemic changes as above. Electronically Signed   By: Logan Bores M.D.   On: 03/01/2017 07:41   Pelvis Portable  Result Date: 03/02/2017 CLINICAL DATA:  76 year old female post left femur open reduction and internal fixation. Subsequent encounter. EXAM: PORTABLE PELVIS 1-2 VIEWS COMPARISON:  Intraoperative exam 03/02/2017 and preoperative exam 02/28/2017. FINDINGS: Mild rotation to the left. Left acetabulum/ pelvic wall better delineated on 03/02/2017 femur films. Post left intertrochanteric open reduction and internal fixation without complicating feature noted. Vascular calcifications. IMPRESSION: Post left intertrochanteric fracture reduction.  Please see above. Aortic atherosclerosis. Electronically Signed   By: Genia Del M.D.   On: 03/02/2017 20:27   Dg C-arm 1-60 Min  Result Date: 03/02/2017 CLINICAL DATA:   Hardware fixation of a comminuted left intertrochanteric fracture. EXAM: DG C-ARM 61-120 MIN COMPARISON:  02/28/2017. FINDINGS: C-arm fluoroscopy was provided in the operating room. 80 seconds of fluoroscopy was used. See the left femur C-arm views report from today. IMPRESSION: C-arm fluoroscopy provided in the operating room. Electronically Signed   By: Claudie Revering M.D.   On: 03/02/2017 16:48   Dg Femur Min 2 Views Left  Result Date: 03/02/2017 CLINICAL DATA:  Operative fixation of a left intertrochanteric fracture. EXAM: LEFT FEMUR 2 VIEWS COMPARISON:  Left femur radiographs dated 02/28/2017. FINDINGS: Interval compression screw and rod fixation of the previously demonstrated left intertrochanteric fracture with anatomic position and alignment of the major fragments. No additional fractures or dislocation seen. IMPRESSION: Hardware fixation of the previously demonstrated left intratrochanteric fracture with anatomic position and alignment of the major fragments. Electronically Signed   By: Claudie Revering M.D.   On: 03/02/2017 16:44   Dg Femur Min 2 Views Left  Result Date: 02/28/2017 CLINICAL DATA:  Seizure. EXAM: LEFT FEMUR 2 VIEWS COMPARISON:  None. FINDINGS: Diffuse osteopenia. Moderately displaced intertrochanteric fracture of the left femur with exaggerated coxa vera deformity about the fracture site. Mild degenerate changes of the left hip and knee. Calcified plaque over the left femoral artery. IMPRESSION: Moderately displaced left intertrochanteric fracture. Electronically Signed   By: Marin Olp M.D.   On: 02/28/2017 07:24   Dg Femur Port Min 2 Views Left  Result Date: 03/02/2017 CLINICAL DATA:  Post IM nail EXAM: LEFT FEMUR PORTABLE 2 VIEWS COMPARISON:  02/28/2017 FINDINGS: Internal fixation across the left femoral intertrochanteric fracture. Anatomic alignment. No hardware or bony complicating feature. IMPRESSION: Internal fixation.  No complicating feature. Electronically Signed   By:  Rolm Baptise M.D.   On: 03/02/2017 18:08    DISCHARGE EXAMINATION: Vitals:   03/04/17 0439 03/04/17 1545 03/04/17 2123 03/05/17 0627  BP: (!) 144/44 (!) 130/47 (!) 137/47 (!) 139/47  Pulse: 93 90 88 81  Resp: _0 Temp: 99 F (37.2 C)  98.7 F (37.1 C) 98.2 F (36.8 C)  TempSrc: Oral  Oral Oral  SpO2: 97% 98% 98% 100%  Weight:      Height:       General appearance: alert, cooperative, appears stated age and no distress Resp: clear to auscultation bilaterally Cardio: regular rate and rhythm, S1, S2 normal, no murmur, click, rub or gallop GI: soft, non-tender; bowel sounds normal; no masses,  no organomegaly No bruising noted over the left thigh  DISPOSITION: SNF  Discharge Instructions    Ambulatory referral to Neurology    Complete by:  As directed    An appointment is requested in approximately: 4 weeks for seizure and ?stroke   Call MD for:  difficulty breathing, headache or visual disturbances    Complete by:  As directed    Call MD for:  extreme fatigue    Complete by:  As directed    Call MD for:  persistant dizziness or light-headedness    Complete by:  As directed    Call MD for:  persistant nausea and vomiting    Complete by:  As directed    Call MD for:  temperature >100.4    Complete by:  As directed    Discharge instructions    Complete by:  As directed    See instructions on the discharge summary  You were cared for by a hospitalist during your hospital stay. If you have any questions about your discharge medications or the care you received while you were in the hospital after you are discharged, you can call the unit and asked to speak with the hospitalist on call if the hospitalist that took care of you is not available. Once you are discharged, your primary care physician will handle any further medical issues. Please note that NO REFILLS for any discharge medications will be authorized once you are discharged, as it is imperative that you return to  your primary care physician (or establish a relationship with a primary care physician if you do not have one) for your aftercare needs so that they can reassess your need for medications and monitor your lab values. If you do not have a primary care physician, you can call [phone] for a physician referral.   Increase activity slowly    Complete by:  As directed       ALLERGIES:  Allergies  Allergen Reactions  . Tape Rash    Please use paper tape     Current Discharge Medication List    START taking these medications   Details  docusate sodium (COLACE) 100 MG capsule Take 1 capsule (100 mg total) by mouth 2 (two) times daily. Qty: 10 capsule, Refills: 0    enoxaparin (LOVENOX) 40 MG/0.4ML injection Inject 0.4 mLs (40 mg total) into the skin daily. Qty: 30 Syringe, Refills: 0    ferrous sulfate 325 (65 FE) MG tablet Take 1 tablet (325 mg total) by mouth 3 (three) times daily after meals. Refills: 3    folic acid (FOLVITE) 1 MG tablet Take 1 tablet (1 mg total) by mouth daily.    HYDROcodone-acetaminophen (NORCO/VICODIN) 5-325 MG  tablet Take 1-2 tablets by mouth every 6 (six) hours as needed for moderate pain. Qty: 60 tablet, Refills: 0    methocarbamol (ROBAXIN) 500 MG tablet Take 1 tablet (500 mg total) by mouth every 6 (six) hours as needed for muscle spasms. Qty: 30 tablet, Refills: 0    phenytoin (DILANTIN) 300 MG ER capsule Take 1 capsule (300 mg total) by mouth at bedtime.    polyethylene glycol (MIRALAX / GLYCOLAX) packet Take 17 g by mouth daily. Qty: 14 each, Refills: 0    vitamin B-12 1000 MCG tablet Take 1 tablet (1,000 mcg total) by mouth daily.      CONTINUE these medications which have CHANGED   Details  carvedilol (COREG) 12.5 MG tablet Take 1 tablet (12.5 mg total) by mouth 2 (two) times daily.      CONTINUE these medications which have NOT CHANGED   Details  aspirin 81 MG chewable tablet Chew 243 mg by mouth at bedtime.    atorvastatin (LIPITOR) 40  MG tablet Take 40 mg by mouth at bedtime.     ibuprofen (ADVIL) 200 MG tablet Take 200 mg by mouth every 6 (six) hours as needed for headache (pain).     pantoprazole (PROTONIX) 40 MG tablet Take 40 mg by mouth daily.    potassium chloride SA (K-DUR,KLOR-CON) 20 MEQ tablet Take 1 tablet (20 mEq total) by mouth daily. Qty: 90 tablet, Refills: 3      STOP taking these medications     diltiazem (CARDIZEM CD) 120 MG 24 hr capsule      isosorbide mononitrate (IMDUR) 30 MG 24 hr tablet      lisinopril (PRINIVIL,ZESTRIL) 20 MG tablet      diltiazem (CARDIZEM CD) 180 MG 24 hr capsule           Contact information for follow-up providers    Swinteck, Aaron Edelman, MD. Schedule an appointment as soon as possible for a visit in 2 week(s).   Specialty:  Orthopedic Surgery Why:  For wound re-check Contact information: [address]. [address] [place] [address] [phone]        Redmond School, MD. Schedule an appointment as soon as possible for a visit in 3 week(s).   Specialty:  Internal Medicine Contact information: [address] [place] [address] [phone]            Contact information for after-discharge care    Destination    [facility] .   Specialty:  Skilled Nursing Facility Contact information: [place] [phone]                  TOTAL DISCHARGE TIME: 87 mins  [facility] Pager [phone]  03/05/2017, 12:37 PM

## 2017-03-05 NOTE — Progress Notes (Signed)
Patient will DC to: [facility]) Anticipated DC date: 03/05/17 Family notified: Sons Transport by: Corey Harold   Per MD patient ready for DC to SNF. RN, patient, patient's family, and facility notified of DC. Discharge Summary sent to facility. RN given number for report. DC packet on chart. Ambulance transport requested for patient.   CSW signing off.  Cedric Fishman, [place] Social Worker [phone]

## 2017-03-05 NOTE — Patient Outreach (Signed)
Assessment:  [name] is transferring [name], DOB 2040-09-09, to [place] on 03/05/17.   [name] MSW, LCSW Licensed Clinical Social Worker [facility] Care Management [phone]

## 2017-03-05 NOTE — Clinical Social Work Note (Signed)
Clinical Social Work Assessment  Patient Details  Name: [name] MRN: [id number] Date of Birth: 09/14/1940  Date of referral:  03/05/17               Reason for consult:  Facility Placement                Permission sought to share information with:  Facility Sport and exercise psychologist, Family Supports Permission granted to share information::  Yes, Verbal Permission Granted  Name::     [name]/[name]  Agency::  SNFs  Relationship::  Sons  Contact Information:     Housing/Transportation Living arrangements for the past 2 months:  Single Family Home Source of Information:  Patient, Adult Children Patient Interpreter Needed:  None Criminal Activity/Legal Involvement Pertinent to Current Situation/Hospitalization:  No - Comment as needed Significant Relationships:  Adult Children Lives with:  Self Do you feel safe going back to the place where you live?  No Need for family participation in patient care:  Yes (Comment)  Care giving concerns:  CSW received consult for possible SNF placement at time of discharge. CSW spoke with patient regarding PT recommendation of SNF placement at time of discharge. Patient and her sons (joint HCPOAs) reported that patient lives alone and her sons are currently unable to care for patient at their home given patient's current physical needs and fall risk. Patient expressed understanding of PT recommendation and is agreeable to SNF placement at time of discharge. CSW to continue to follow and assist with discharge planning needs.   Social Worker assessment / plan:  CSW spoke with patient concerning possibility of rehab at [facility] before returning home.  Employment status:  Retired Nurse, adult PT Recommendations:  [place] / Referral to community resources:  [place]  Patient/Family's Response to care:  Patient recognizes need for rehab before returning home and is agreeable to a SNF in  [place].   Patient/Family's Understanding of and Emotional Response to Diagnosis, Current Treatment, and Prognosis:  Patient/family is realistic regarding therapy needs and expressed being hopeful for SNF placement. Patient expressed understanding of CSW role and discharge process as well as her medical needs. She is now understanding that she will get better rehab at a snf. No questions/concerns about plan or treatment.    Emotional Assessment Appearance:  Appears stated age Attitude/Demeanor/Rapport:  Other (Appropriate) Affect (typically observed):  Accepting, Appropriate Orientation:  Oriented to Self, Oriented to Place, Oriented to  Time, Oriented to Situation Alcohol / Substance use:  Not Applicable Psych involvement (Current and /or in the community):  No (Comment)  Discharge Needs  Concerns to be addressed:  Care Coordination Readmission within the last 30 days:  No Current discharge risk:  None Barriers to Discharge:  No Barriers Identified   Benard Halsted, [place] 03/05/2017, 2:56 PM

## 2017-03-05 NOTE — Evaluation (Signed)
Speech Language Pathology Evaluation Patient Details Name: [name] MRN: [id number] DOB: [birth year] Today's Date: 03/05/2017 Time: 1010-1035 SLP Time Calculation (min) (ACUTE ONLY): 25 min  Problem List:  Patient Active Problem List   Diagnosis Date Noted  . Closed pertrochanteric fracture of femur, left, initial encounter ([place]) 03/02/2017  . Seizure ([place]) 02/28/2017  . Hip fracture ([place]) 02/28/2017  . Chest pain 12/07/2016  . Atypical chest pain 12/07/2016  . Tachycardia   . NSTEMI (non-ST elevated myocardial infarction) ([place]) 11/02/2016  . Multifocal atrial tachycardia ([place]) 09/25/2016  . Elevated troponin 09/24/2016  . Hypokalemia 09/23/2016  . TIA (transient ischemic attack) 05/05/2016  . Intractable nausea and vomiting 10/15/2015  . Acute coronary syndrome ([place]) 06/01/2015  . Bronchitis 06/01/2015  . Thrombocytopenia ([place]) 06/01/2015  . Cerebral infarction ([place]) 03/06/2014  . UTI (lower urinary tract infection)   . PSVT (paroxysmal supraventricular tachycardia) ([place]) 12/09/2013  . Esophageal varices ([place]) 03/29/2013  . Hematemesis 03/29/2013  . Hepatic cirrhosis ([place]) 03/29/2013  . Presence of stent in right coronary artery 07/04/2011  . OSA (obstructive sleep apnea) 07/04/2011  . Aortic sclerosis 07/04/2011  . Aortic insufficiency 07/04/2011  . HTN (hypertension) 07/04/2011  . Carotid stenosis, bilateral 07/04/2011  . Chest pain at rest 07/03/2011  . CAD (coronary artery disease) 07/03/2011  . DM type 2 (diabetes mellitus, type 2) ([place]) 07/03/2011  . Hypercholesteremia 07/03/2011  . GERD (gastroesophageal reflux disease) 07/03/2011   Past Medical History:  Past Medical History:  Diagnosis Date  . Anemia   . Aortic insufficiency    Moderate  . Back pain   . Carotid stenosis, bilateral   . Cirrhosis ([place])   . Coronary artery disease    Stent x 2 RCA 1995, cardiac catheterization 09/2016 showing only mild atherosclerosis  . DDD (degenerative disc disease),  lumbar   . Essential hypertension   . GERD (gastroesophageal reflux disease)   . Hiatal hernia   . History of stroke   . Hypercholesteremia   . IBS (irritable bowel syndrome)   . Myocardial infarction ([place]) 1995  . Non-alcoholic fatty liver disease   . OSA (obstructive sleep apnea)    Sleep study 2009  AHI 9.91/hr and during REM sleep 35.58/hr  . Pericardial effusion    a. small by echo 2018.  Marland Kitchen PSVT (paroxysmal supraventricular tachycardia) ([place])   . Recurrent UTI   . Type 2 diabetes mellitus ([place])   . Varices, esophageal (H[facility])    Past Surgical History:  Past Surgical History:  Procedure Laterality Date  . [place]  . APPENDECTOMY    . BACK SURGERY  1985  . CARDIAC CATHETERIZATION  [phone]   Stent to the proximal RCA after MI   . CARDIAC CATHETERIZATION N/A 09/26/2016   Procedure: Left Heart Cath and Coronary Angiography;  Surgeon: Leonie Man, MD;  Location: [facility];  Service: Cardiovascular;  Laterality: N/A;  . CATARACT EXTRACTION W/PHACO Right 07/04/2014   Procedure: CATARACT EXTRACTION PHACO AND INTRAOCULAR LENS PLACEMENT (IOC);  Surgeon: Elta Guadeloupe T. Gershon Crane, MD;  Location: [facility];  Service: Ophthalmology;  Laterality: Right;  CDE:13.13  . COLONOSCOPY    . DILATION AND CURETTAGE OF UTERUS     x2  . Epi Retinal Membrane Peel Left   . ERCP    . ESOPHAGEAL BANDING N/A 04/01/2013   Procedure: ESOPHAGEAL BANDING;  Surgeon: Rogene Houston, MD;  Location: [facility];  Service: Endoscopy;  Laterality: N/A;  . ESOPHAGEAL BANDING N/A 05/24/2013  Procedure: ESOPHAGEAL BANDING;  Surgeon: Rogene Houston, MD;  [facility];  Service: Endoscopy;  Laterality: N/A;  . ESOPHAGEAL BANDING N/A 06/21/2014   Procedure: ESOPHAGEAL BANDING;  Surgeon: Rogene Houston, MD;  [facility];  Service: Endoscopy;  Laterality: N/A;  . ESOPHAGOGASTRODUODENOSCOPY N/A 04/01/2013   Procedure: ESOPHAGOGASTRODUODENOSCOPY (EGD);  Surgeon: Rogene Houston, MD;  [facility];  Service: Endoscopy;  Laterality: N/A;  230-rescheduled to 8:30am Ann notified pt  . ESOPHAGOGASTRODUODENOSCOPY N/A 05/24/2013   Procedure: ESOPHAGOGASTRODUODENOSCOPY (EGD);  Surgeon: Rogene Houston, MD;  [facility];  Service: Endoscopy;  Laterality: N/A;  730  . ESOPHAGOGASTRODUODENOSCOPY N/A 06/21/2014   Procedure: ESOPHAGOGASTRODUODENOSCOPY (EGD);  Surgeon: Rogene Houston, MD;  [facility];  Service: Endoscopy;  Laterality: N/A;  930-rescheduled 10/14 @ 1200 Ann to notify pt  . EYE SURGERY  08   cataract surgery of the left eye  . FEMUR IM NAIL Left 03/02/2017   Procedure: INTRAMEDULLARY (IM) NAIL FEMORAL;  Surgeon: Rod Can, MD;  Location: [facility];  Service: Orthopedics;  Laterality: Left;  . HARDWARE REMOVAL Right 01/17/2013   Procedure: REMOVAL OF HARDWARE AND EXCISION ULNAR STYLOID RIGHT WRIST;  Surgeon: Tennis Must, MD;  Location: [facility];  Service: Orthopedics;  Laterality: Right;  . MYRINGOTOMY  2012   both ears  . TONSILLECTOMY    . VAGINAL HYSTERECTOMY  1972  . WRIST SURGERY     rt wrist hardwear removal   HPI:  [name] an 76 y.o.femalewith hx of prior CVA, recent admission for atypical chest pain with chronic troponin elevations, hx of anemia, non obstructive carotid disease, and known CAD s/p RCA stent, with cath Jan 2018 showing non obstructive CAD, discharged yesterday for a chest pain admission, brought to the ER as she was having status epilepticus. In the ER, she had focal seizures, and was given Ativan and subsequently loaded with IV Dilantin. Her further work up included a head CT which showed old lacunar infarct but no acute process. Labs showed Hb of 11 grams per dL, and WBC of 11K. Her Cr was 1.3, and her troponin was 0.38 (baseline). Noting her left leg was shorter and externally rotated, Xray showed acute left intertroch Fx. Plan for surgical repait tomorrow.  Her EKG  showed NSR with 1 degree AVB, no acute ischemic or injurious changes. Hospitalist was asked to admit her for new onset of seizure, acute left hip Fx, chronic elevation of troponin, and mild AKI.   MRI of the head is showing punctate foci of abnormal diffusion signal in the left precentral gyrus, left corona radiata and the right thalamus c/w acute/subacute embolic infarcts.  Chest xray is showing mild cardiomegaly and left base atelectasis.     Assessment / Plan / Recommendation Clinical Impression  Pt with largely functional cognitive linguistic abilities determined via MOCA. 7.2.  Scoring was 19/25 minus written portion due to pt's poor positioning in bed and discomfort.  Strengths included attention, naming, sentence repetition and oreintation.  Difficulties were in delayed recall with pt requiring cues for 4/5 single words.  SLP provided pt with memory compensation strategies in writing.  As pt with fluent speech, language and all education completed, no follo wup needed.  Thanks for this consult.     SLP Assessment  SLP Recommendation/Assessment: Patient does not need any further Speech Lanaguage Pathology Services SLP Visit Diagnosis: Cognitive communication deficit (R41.841)    Follow Up Recommendations  None    Frequency and Duration   n/a        SLP Evaluation Cognition  Overall Cognitive Status: Within Functional Limits for tasks assessed Arousal/Alertness: Awake/alert Orientation Level: Oriented X4 Attention: Focused Focused Attention: Appears intact Memory: Impaired Memory Impairment: Retrieval deficit Awareness: Appears intact Problem Solving: Appears intact Safety/Judgment: Appears intact       Comprehension  Auditory Comprehension Overall Auditory Comprehension: Appears within functional limits for tasks assessed Yes/No Questions: Not tested Commands: Within Functional Limits Conversation: Complex Visual Recognition/Discrimination Discrimination: Not  tested Reading Comprehension Reading Status: Within funtional limits    Expression Expression Primary Mode of Expression: Verbal Verbal Expression Overall Verbal Expression: Appears within functional limits for tasks assessed Initiation: No impairment Repetition: No impairment Naming: No impairment Pragmatics: No impairment Non-Verbal Means of Communication: Not applicable Written Expression Dominant Hand: Right Written Expression: Not tested (secondary to pt poor positioning)   Oral / Motor  Oral Motor/Sensory Function Overall Oral Motor/Sensory Function: Mild impairment Facial ROM: Reduced left Facial Symmetry: Abnormal symmetry left Facial Strength: Within Functional Limits Lingual ROM: Within Functional Limits Lingual Symmetry: Within Functional Limits Lingual Strength: Within Functional Limits Mandible: Within Functional Limits Motor Speech Overall Motor Speech: Appears within functional limits for tasks assessed Respiration: Within functional limits Resonance: Within functional limits Articulation: Within functional limitis Intelligibility: Intelligible Motor Planning: Witnin functional limits   [place], [place] [facility] [place] [phone]

## 2017-03-05 NOTE — Progress Notes (Signed)
Physical Therapy Treatment Patient Details Name: [name] MRN: [id number] DOB: 07-07-1941 Today's Date: 03/05/2017    History of Present Illness 76 y.o. female with a history of previous stroke with cortical encephalomalacia, bilateral carotid stenosis, coronary artery disease, hyperlipidemia, PSVT, diabetes mellitus, NAFLD, cirrhosis, and esophageal varices, who presented 02/28/17 with new onset seizures. She was additionally discharged 02/27/17 with SVT and elevated troponin, but then presented to [facility] emergency department with new onset seizures, which apparently were focal in the emergency department. She was loaded with dilantin.  CT head on 02/28/17 was negative for stroke.  MRI brain on 03/01/17 showed multifocal embolic punctate seizures in left precentral gyrus, left corona radiata, and right thalamus.  The patient was also found to have an acute left acute intertrochanteric femur fracture now s/p IM nailing on 6/26.    PT Comments    Pt seen for mobility progression. She continues to be limited secondary to significant pain in L hip and foot. Pt required max A x2 for bed mobility and transfers this session. Pt would continue to benefit from skilled physical therapy services at this time while admitted and after d/c to address the below listed limitations in order to improve overall safety and independence with functional mobility.    Follow Up Recommendations  SNF     Equipment Recommendations  None recommended by PT    Recommendations for Other Services       Precautions / Restrictions Precautions Precautions: Fall Precaution Comments: seizure Restrictions Weight Bearing Restrictions: Yes LLE Weight Bearing: Touchdown weight bearing    Mobility  Bed Mobility Overal bed mobility: Needs Assistance Bed Mobility: Supine to Sit     Supine to sit: Max assist;+2 for physical assistance     General bed mobility comments: increased time, vc'ing for sequencing, assist  at bilateral LEs and to elevate trunk to achieve sitting EOB with use of bed pads  Transfers Overall transfer level: Needs assistance Equipment used: 2 person hand held assist Transfers: Sit to/from [employer] Transfers Sit to Stand: Max assist;+2 physical assistance Stand pivot transfers: Max assist;+2 physical assistance       General transfer comment: use of gait belt and bed pads to achieve standing from EOB, max A x2 for pivotal movements to chair and to maintain TDWB L LE throughout  Ambulation/Gait                 Stairs            Wheelchair Mobility    Modified Rankin (Stroke Patients Only)       Balance Overall balance assessment: Needs assistance Sitting-balance support: No upper extremity supported;Feet supported Sitting balance-Leahy Scale: Poor Sitting balance - Comments: initially requiring max A secondary to heaving posterior lean, progressing to min A Postural control: Posterior lean Standing balance support: Bilateral upper extremity supported;During functional activity Standing balance-Leahy Scale: Poor Standing balance comment: +2 physical assistance with B UE support to maintain upright.                             Cognition Arousal/Alertness: Awake/alert Behavior During Therapy: WFL for tasks assessed/performed Overall Cognitive Status: Within Functional Limits for tasks assessed                                        Exercises      General  Comments        Pertinent Vitals/Pain Pain Assessment: Faces Pain Score: 2  Faces Pain Scale: Hurts whole lot Pain Location: L foot and hip Pain Descriptors / Indicators: Grimacing;Guarding;Moaning Pain Intervention(s): Monitored during session;Repositioned    Home Living     Available Help at Discharge: Family                Prior Function            PT Goals (current goals can now be found in the care plan section) Acute Rehab PT  Goals PT Goal Formulation: With patient Time For Goal Achievement: 03/17/17 Potential to Achieve Goals: Good Progress towards PT goals: Progressing toward goals    Frequency    Min 3X/week      PT Plan Current plan remains appropriate    Co-evaluation              AM-PAC PT "6 Clicks" Daily Activity  Outcome Measure  Difficulty turning over in bed (including adjusting bedclothes, sheets and blankets)?: Total Difficulty moving from lying on back to sitting on the side of the bed? : Total Difficulty sitting down on and standing up from a chair with arms (e.g., wheelchair, bedside commode, etc,.)?: Total Help needed moving to and from a bed to chair (including a wheelchair)?: A Lot Help needed walking in hospital room?: Total Help needed climbing 3-5 steps with a railing? : Total 6 Click Score: 7    End of Session Equipment Utilized During Treatment: Gait belt Activity Tolerance: Patient limited by pain Patient left: in chair;with call bell/phone within reach;with chair alarm set Nurse Communication: Mobility status;Weight bearing status PT Visit Diagnosis: Other symptoms and signs involving the nervous system (R29.898);Pain Pain - Right/Left: Left Pain - part of body: Hip;Ankle and joints of foot     Time: 1610-9604 PT Time Calculation (min) (ACUTE ONLY): 15 min  Charges:  $Therapeutic Activity: 8-22 mins                    G Codes:       [place], [place], [place] 03/05/2017, 11:56 AM

## 2017-03-05 NOTE — Clinical Social Work Placement (Signed)
   CLINICAL SOCIAL WORK PLACEMENT  NOTE  Date:  03/05/2017  Patient Details  Name: [name] MRN: [id number] Date of Birth: [birth year]  Clinical Social Work is seeking post-discharge placement for this patient at the [place] level of care (*CSW will initial, date and re-position this form in  chart as items are completed):  Yes   Patient/family provided with [place] Work Department's list of facilities offering this level of care within the geographic area requested by the patient (or if unable, by the patient's family).  Yes   Patient/family informed of their freedom to choose among providers that offer the needed level of care, that participate in Medicare, Medicaid or managed care program needed by the patient, have an available bed and are willing to accept the patient.  Yes   Patient/family informed of [facility]'s ownership interest in [facility] and [facility], as well as of the fact that they are under no obligation to receive care at these facilities.  PASRR submitted to EDS on 03/03/17     PASRR number received on 03/03/17     Existing PASRR number confirmed on       FL2 transmitted to all facilities in geographic area requested by pt/family on 03/03/17     FL2 transmitted to all facilities within larger geographic area on       Patient informed that his/her managed care company has contracts with or will negotiate with certain facilities, including the following:        Yes   Patient/family informed of bed offers received.  Patient chooses bed at [place] [facility]     Physician recommends and patient chooses bed at      Patient to be transferred to [facility] [place] on 03/05/17.  Patient to be transferred to facility by [facility]     Patient family notified on 03/05/17 of transfer.  Name of family member notified:  Sons     PHYSICIAN       Additional Comment:     _______________________________________________ Benard Halsted, [place] 03/05/2017, 1:37 PM

## 2017-03-06 NOTE — Addendum Note (Signed)
Addendum  created 03/06/17 1010 by Roberts Gaudy, MD   Anesthesia Staff edited

## 2017-03-09 ENCOUNTER — Other Ambulatory Visit: Payer: Self-pay | Admitting: *Deleted

## 2017-03-09 ENCOUNTER — Encounter: Payer: Self-pay | Admitting: *Deleted

## 2017-03-09 DIAGNOSIS — E46 Unspecified protein-calorie malnutrition: Secondary | ICD-10-CM | POA: Diagnosis not present

## 2017-03-09 DIAGNOSIS — I2510 Atherosclerotic heart disease of native coronary artery without angina pectoris: Secondary | ICD-10-CM | POA: Diagnosis not present

## 2017-03-09 DIAGNOSIS — E559 Vitamin D deficiency, unspecified: Secondary | ICD-10-CM | POA: Diagnosis not present

## 2017-03-09 DIAGNOSIS — K7460 Unspecified cirrhosis of liver: Secondary | ICD-10-CM | POA: Diagnosis not present

## 2017-03-09 NOTE — Patient Outreach (Signed)
[facility]) Care Management  [place]  03/09/2017   [name] 21-Jan-1941 [id number]   Encounter Medications:  Outpatient Encounter Prescriptions as of 03/09/2017  Medication Sig  . aspirin 81 MG chewable tablet Chew 243 mg by mouth at bedtime.  Marland Kitchen atorvastatin (LIPITOR) 40 MG tablet Take 40 mg by mouth at bedtime.   . carvedilol (COREG) 12.5 MG tablet Take 1 tablet (12.5 mg total) by mouth 2 (two) times daily.  Marland Kitchen docusate sodium (COLACE) 100 MG capsule Take 1 capsule (100 mg total) by mouth 2 (two) times daily.  Marland Kitchen enoxaparin (LOVENOX) 40 MG/0.4ML injection Inject 0.4 mLs (40 mg total) into the skin daily.  . ferrous sulfate 325 (65 FE) MG tablet Take 1 tablet (325 mg total) by mouth 3 (three) times daily after meals.  . folic acid (FOLVITE) 1 MG tablet Take 1 tablet (1 mg total) by mouth daily.  Marland Kitchen HYDROcodone-acetaminophen (NORCO/VICODIN) 5-325 MG tablet Take 1-2 tablets by mouth every 6 (six) hours as needed for moderate pain.  Marland Kitchen ibuprofen (ADVIL) 200 MG tablet Take 200 mg by mouth every 6 (six) hours as needed for headache (pain).   . methocarbamol (ROBAXIN) 500 MG tablet Take 1 tablet (500 mg total) by mouth every 6 (six) hours as needed for muscle spasms.  . pantoprazole (PROTONIX) 40 MG tablet Take 40 mg by mouth daily.  . phenytoin (DILANTIN) 300 MG ER capsule Take 1 capsule (300 mg total) by mouth at bedtime.  . polyethylene glycol (MIRALAX / GLYCOLAX) packet Take 17 g by mouth daily.  . potassium chloride SA (K-DUR,KLOR-CON) 20 MEQ tablet Take 1 tablet (20 mEq total) by mouth daily.  . vitamin B-12 1000 MCG tablet Take 1 tablet (1,000 mcg total) by mouth daily.   No facility-administered encounter medications on file as of 03/09/2017.     Functional Status:  In your present state of health, do you have any difficulty performing the following activities: 03/09/2017 03/02/2017  Hearing? N -  Vision? Y -  Difficulty concentrating or making decisions? N -   Walking or climbing stairs? Y -  Dressing or bathing? Y -  Doing errands, shopping? Y Y  Some recent data might be hidden    Fall/Depression Screening: Fall Risk  03/09/2017  Falls in the past year? Yes  Number falls in past yr: 1  Injury with Fall? Yes  Risk for fall due to : Impaired balance/gait;Impaired mobility  Follow up Falls prevention discussed   PHQ 2/9 Scores 03/09/2017  PHQ - 2 Score 0    Assessment:   CSW received referral from [facility], [name] 6/28 that patient was being discharge from [facility] to [facility] CSW met with patient at SNF in patient's room to complete admission intake, patient reports that she was in her bathroom when she fell and broke her hip - thankfully, her grand-daughters were at her home when this occurred and called 911. Patient reports that her son, [name] lives across the street from her and her other son - [name] is a Restaurant manager, fast food in [place]. Patient anticipates that she will need to stay at SNF for 12-15 days and plans to return home. She has a follow-up appointment with Dr. Lyla [name] at [facility] on 7/10. Patient states that this fall was her first and only fall she can remember, though she has had 5 admissions to the hospital within the past 6 months.   Plan:   CSW will continue to  follow & make referral to [place] once discharged from [facility].   [facility] CM Care Plan Problem One     Most Recent Value  Care Plan Problem One  Patient admitted to [facility] ([facility] from [facility] following hip surgery  Role Documenting the Problem One  Clinical Social Worker  Care Plan for Problem One  Active  [facility] Long Term Goal   Patient plans to return home after SNF/Rehab  [facility] Long Term Goal Start Date  03/09/17  Interventions for Problem One Long Term Goal  Patient will participate with PT/OT while at [facility] & attend all follow-up MD visits  [facility] CM Short Term Goal #1    Patient will be connected with community resources   [facility] CM Short Term Goal #1 Start Date  03/09/17  Interventions for Short Term Goal #1  CSW will have advance directive mailed to patient along with EMMI explaining Advance Directives       [name], [place] Social Worker cell #: [phone]

## 2017-03-12 DIAGNOSIS — E46 Unspecified protein-calorie malnutrition: Secondary | ICD-10-CM | POA: Diagnosis not present

## 2017-03-12 DIAGNOSIS — R569 Unspecified convulsions: Secondary | ICD-10-CM | POA: Diagnosis not present

## 2017-03-12 DIAGNOSIS — K7460 Unspecified cirrhosis of liver: Secondary | ICD-10-CM | POA: Diagnosis not present

## 2017-03-12 DIAGNOSIS — I10 Essential (primary) hypertension: Secondary | ICD-10-CM | POA: Diagnosis not present

## 2017-03-13 ENCOUNTER — Ambulatory Visit: Payer: Self-pay | Admitting: Adult Health

## 2017-03-13 ENCOUNTER — Encounter: Payer: Self-pay | Admitting: Adult Health

## 2017-03-13 NOTE — Progress Notes (Deleted)
Cardiology Office Note   Date:  03/13/2017   ID:  [name], DOB 02-03-1941, MRN [id number]  PCP:  Redmond School, MD  Cardiologist: Lennie Muckle chief complaint on file.     History of Present Illness: [name] is a 76 y.o. female who presents for ongoing assessment and management post hospitalization with known history of PSVT, coronary artery disease with most recent catheterization in January 2018 with nonobstructive disease, other history to include carotid artery stenosis, cryptogenic cirrhosis followed by GI.   She was hospitalized for acute seizure, status post fall fracturing left hip, and found to have acute CVA., The patient was discharged on 03/05/2017. Diltiazem was discontinued on discharge along with lisinopril and isosorbide. Carvedilol was continued at lower dose of 12.5 mg twice a day.    Past Medical History:  Diagnosis Date  . Anemia   . Aortic insufficiency    Moderate  . Back pain   . Carotid stenosis, bilateral   . Cirrhosis ([place])   . Coronary artery disease    Stent x 2 RCA 1995, cardiac catheterization 09/2016 showing only mild atherosclerosis  . DDD (degenerative disc disease), lumbar   . Essential hypertension   . GERD (gastroesophageal reflux disease)   . Hiatal hernia   . History of stroke   . Hypercholesteremia   . IBS (irritable bowel syndrome)   . Myocardial infarction ([place]) 1995  . Non-alcoholic fatty liver disease   . OSA (obstructive sleep apnea)    Sleep study 2009  AHI 9.91/hr and during REM sleep 35.58/hr  . Pericardial effusion    a. small by echo 2018.  Marland Kitchen PSVT (paroxysmal supraventricular tachycardia) ([place])   . Recurrent UTI   . Type 2 diabetes mellitus ([place])   . Varices, esophageal ([place])     Past Surgical History:  Procedure Laterality Date  . [place]  . APPENDECTOMY    . BACK SURGERY  1985  . CARDIAC CATHETERIZATION  [phone]   Stent to the proximal RCA after MI   . CARDIAC CATHETERIZATION  N/A 09/26/2016   Procedure: Left Heart Cath and Coronary Angiography;  Surgeon: Leonie Man, MD;  Location: [place] CV LAB;  Service: Cardiovascular;  Laterality: N/A;  . CATARACT EXTRACTION W/PHACO Right 07/04/2014   Procedure: CATARACT EXTRACTION PHACO AND INTRAOCULAR LENS PLACEMENT (IOC);  Surgeon: Elta Guadeloupe T. Gershon Crane, MD;  Location: [facility] ORS;  Service: Ophthalmology;  Laterality: Right;  CDE:13.13  . COLONOSCOPY    . DILATION AND CURETTAGE OF UTERUS     x2  . Epi Retinal Membrane Peel Left   . ERCP    . ESOPHAGEAL BANDING N/A 04/01/2013   Procedure: ESOPHAGEAL BANDING;  Surgeon: Rogene Houston, MD;  Location: [facility] ENDO SUITE;  Service: Endoscopy;  Laterality: N/A;  . ESOPHAGEAL BANDING N/A 05/24/2013   Procedure: ESOPHAGEAL BANDING;  Surgeon: Rogene Houston, MD;  Location: [facility] ENDO SU[facility];  Service: Endoscopy;  Laterality: N/A;  . ESOPHAGEAL BANDING N/A 06/21/2014   Procedure: ESOPHAGEAL BANDING;  Surgeon: Rogene Houston, MD;  Location: [facility];  Service: Endoscopy;  Laterality: N/A;  . ESOPHAGOGASTRODUODENOSCOPY N/A 04/01/2013   Procedure: ESOPHAGOGASTRODUODENOSCOPY (EGD);  Surgeon: Rogene Houston, MD;  Location: [facility];  Service: Endoscopy;  Laterality: N/A;  230-rescheduled to 8:30am Ann notified pt  . ESOPHAGOGASTRODUODENOSCOPY N/A 05/24/2013   Procedure: ESOPHAGOGASTRODUODENOSCOPY (EGD);  Surgeon: Rogene Houston, MD;  Location: [facility];  Service: Endoscopy;  Laterality: N/A;  730  . ESOPHAGOGASTRODUODENOSCOPY  N/A 06/21/2014   Procedure: ESOPHAGOGASTRODUODENOSCOPY (EGD);  Surgeon: Rogene Houston, MD;  Location: [facility];  Service: Endoscopy;  Laterality: N/A;  930-rescheduled 10/14 @ 1200 Ann to notify pt  . EYE SURGERY  08   cataract surgery of the left eye  . FEMUR IM NAIL Left 03/02/2017   Procedure: INTRAMEDULLARY (IM) NAIL FEMORAL;  Surgeon: Rod Can, MD;  Location: [facility];  Service: Orthopedics;  Laterality: Left;  . HARDWARE REMOVAL Right 01/17/2013     Procedure: REMOVAL OF HARDWARE AND EXCISION ULNAR STYLOID RIGHT WRIST;  Surgeon: Tennis Must, MD;  Location: [facility];  Service: Orthopedics;  Laterality: Right;  . MYRINGOTOMY  2012   both ears  . TONSILLECTOMY    . VAGINAL HYSTERECTOMY  1972  . WRIST SURGERY     rt wrist hardwear removal     Current Outpatient Prescriptions  Medication Sig Dispense Refill  . aspirin 81 MG chewable tablet Chew 243 mg by mouth at bedtime.    Marland Kitchen atorvastatin (LIPITOR) 40 MG tablet Take 40 mg by mouth at bedtime.     . carvedilol (COREG) 12.5 MG tablet Take 1 tablet (12.5 mg total) by mouth 2 (two) times daily.    Marland Kitchen docusate sodium (COLACE) 100 MG capsule Take 1 capsule (100 mg total) by mouth 2 (two) times daily. 10 capsule 0  . enoxaparin (LOVENOX) 40 MG/0.4ML injection Inject 0.4 mLs (40 mg total) into the skin daily. 30 Syringe 0  . ferrous sulfate 325 (65 FE) MG tablet Take 1 tablet (325 mg total) by mouth 3 (three) times daily after meals.  3  . folic acid (FOLVITE) 1 MG tablet Take 1 tablet (1 mg total) by mouth daily.    Marland Kitchen HYDROcodone-acetaminophen (NORCO/VICODIN) 5-325 MG tablet Take 1-2 tablets by mouth every 6 (six) hours as needed for moderate pain. 60 tablet 0  . ibuprofen (ADVIL) 200 MG tablet Take 200 mg by mouth every 6 (six) hours as needed for headache (pain).     . methocarbamol (ROBAXIN) 500 MG tablet Take 1 tablet (500 mg total) by mouth every 6 (six) hours as needed for muscle spasms. 30 tablet 0  . pantoprazole (PROTONIX) 40 MG tablet Take 40 mg by mouth daily.    . phenytoin (DILANTIN) 300 MG ER capsule Take 1 capsule (300 mg total) by mouth at bedtime.    . polyethylene glycol (MIRALAX / GLYCOLAX) packet Take 17 g by mouth daily. 14 each 0  . potassium chloride SA (K-DUR,KLOR-CON) 20 MEQ tablet Take 1 tablet (20 mEq total) by mouth daily. 90 tablet 3  . vitamin B-12 1000 MCG tablet Take 1 tablet (1,000 mcg total) by mouth daily.     No current  facility-administered medications for this visit.     Allergies:   Tape    Social History:  The patient  reports that she quit smoking about 5 years ago. Her smoking use included Cigarettes. She has a 12.50 pack-year smoking history. She has never used smokeless tobacco. She reports that she does not drink alcohol or use drugs.   Family History:  The patient's family history includes Diabetes in her mother; Heart failure in her father and maternal aunt.    ROS: All other systems are reviewed and negative. Unless otherwise mentioned in H&P    PHYSICAL EXAM: VS:  There were no vitals taken for this visit. , BMI There is no height or weight on file to calculate BMI. GEN: Well nourished, well  developed, in no acute distress  HEENT: normal  Neck: no JVD, carotid bruits, or masses Cardiac: ***RRR; no murmurs, rubs, or gallops,no edema  Respiratory:  clear to auscultation bilaterally, normal work of breathing GI: soft, nontender, nondistended, + BS MS: no deformity or atrophy  Skin: warm and dry, no rash Neuro:  Strength and sensation are intact Psych: euthymic mood, full affect   EKG:  EKG {ACTION; IS/IS [id number]} ordered today. The ekg ordered today demonstrates ***   Recent Labs: 02/28/2017: TSH 0.667 03/01/2017: ALT 15; Magnesium 1.8 03/05/2017: BUN 14; Creatinine, Ser 0.65; Hemoglobin 8.9; Platelets 165; Potassium 3.6; Sodium 136    Lipid Panel    Component Value Date/Time   CHOL 98 03/02/2017 0559   CHOL 142 12/07/2013 1038   TRIG 78 03/02/2017 0559   TRIG 98 12/07/2013 1038   HDL 39 (L) 03/02/2017 0559   HDL 47 12/07/2013 1038   CHOLHDL 2.5 03/02/2017 0559   VLDL 16 03/02/2017 0559   LDLCALC 43 03/02/2017 0559   LDLCALC 75 12/07/2013 1038      Wt Readings from Last 3 Encounters:  02/28/17 130 lb 1.1 oz (59 kg)  02/26/17 130 lb (59 kg)  02/20/17 130 lb (59 kg)      Other studies Reviewed: Additional studies/ records that were reviewed today include:  ***. Review of the above records demonstrates: ***   ASSESSMENT AND PLAN:  1.  ***   Current medicines are reviewed at length with the patient today.    Labs/ tests ordered today include: *** Phill Myron. West Pugh, ANP, AACC   03/13/2017 6:55 AM    [facility] [address], [place], [place] [address] Phone: [phone]; Fax: [phone]

## 2017-03-16 DIAGNOSIS — R569 Unspecified convulsions: Secondary | ICD-10-CM | POA: Diagnosis not present

## 2017-03-16 DIAGNOSIS — K7460 Unspecified cirrhosis of liver: Secondary | ICD-10-CM | POA: Diagnosis not present

## 2017-03-16 DIAGNOSIS — E46 Unspecified protein-calorie malnutrition: Secondary | ICD-10-CM | POA: Diagnosis not present

## 2017-03-16 DIAGNOSIS — I2510 Atherosclerotic heart disease of native coronary artery without angina pectoris: Secondary | ICD-10-CM | POA: Diagnosis not present

## 2017-03-17 DIAGNOSIS — S72142D Displaced intertrochanteric fracture of left femur, subsequent encounter for closed fracture with routine healing: Secondary | ICD-10-CM | POA: Diagnosis not present

## 2017-03-19 ENCOUNTER — Other Ambulatory Visit: Payer: Self-pay | Admitting: *Deleted

## 2017-03-19 NOTE — Patient Outreach (Signed)
[facility]) Care Management  03/19/2017  [name] [birth year] [id number]  CSW called to follow-up with patient at [facility] Patient states that she is unsure how much longer she will be there but has a follow-up appointment with Dr. Wynelle Link at the end of this month and it all depends on whether the bone goes back into place without surgery. Patient states that she still plans to return home after rehab. CSW will check back in 2 weeks.    Raynaldo Opitz, LCSW [facility]  Clinical Social Worker cell #: [phone]

## 2017-03-20 NOTE — Progress Notes (Signed)
Cardiology Office Note   Date:  03/23/2017   ID:  [name], DOB June 11, 1941, MRN [id number]  PCP:  Redmond School, MD  Cardiologist:  [facility]  Chief Complaint  Patient presents with  . Hospitalization Follow-up  . Hypertension      History of Present Illness: [name] is a 76 y.o. female who presents for post hospital physician follow-up after admission for seizures. The patient was found have a CVA and seen by neurology. She was started on Dilantin. She was continued on aspirin and Lipitor. The patient did sustain a left hip fracture from fall related to seizure activity and this is repaired by Dr. Aline Brochure. The patient also had some acute blood loss anemia, stabilized on day of discharge.  The patient did have changes in cardiac medications, after watershed component of her stroke was resolved. Diltiazem, lisinopril, and isosorbide were discontinued. Carvedilol dose was decreased to 12.5 mg twice a day.  She comes today with her son. She is nauseated but is not vomiting. She complained of chest pain overnight lasting 5 hours without ceasing,and then stopping on its own. She is also complaining of left knee pain. She continues to undergo PT at [facility].  She has had labs completed this morning.    Current Outpatient Prescriptions  Medication Sig Dispense Refill  . aspirin 81 MG chewable tablet Chew 243 mg by mouth at bedtime.    . carvedilol (COREG) 12.5 MG tablet Take 1 tablet (12.5 mg total) by mouth 2 (two) times daily.    Marland Kitchen docusate sodium (COLACE) 100 MG capsule Take 100 mg by mouth daily.    Marland Kitchen enoxaparin (LOVENOX) 40 MG/0.4ML injection Inject 0.4 mLs (40 mg total) into the skin daily. 30 Syringe 0  . ferrous sulfate 325 (65 FE) MG tablet Take 1 tablet (325 mg total) by mouth 3 (three) times daily after meals.  3  . folic acid (FOLVITE) 1 MG tablet Take 1 tablet (1 mg total) by mouth daily.    Marland Kitchen HYDROcodone-acetaminophen (NORCO/VICODIN) 5-325 MG tablet Take 1-2 tablets by  mouth every 6 (six) hours as needed for moderate pain. 60 tablet 0  . ibuprofen (ADVIL) 200 MG tablet Take 200 mg by mouth every 6 (six) hours as needed for headache (pain).     . methocarbamol (ROBAXIN) 500 MG tablet Take 1 tablet (500 mg total) by mouth every 6 (six) hours as needed for muscle spasms. 30 tablet 0  . ondansetron (ZOFRAN) 4 MG tablet Take 4 mg by mouth every 8 (eight) hours as needed for nausea or vomiting.    . pantoprazole (PROTONIX) 40 MG tablet   0  . phenytoin (DILANTIN) 300 MG ER capsule Take 1 capsule (300 mg total) by mouth at bedtime. (Patient taking differently: Take 300 mg by mouth 2 (two) times daily. )    . polyethylene glycol (MIRALAX / GLYCOLAX) packet Take 17 g by mouth daily. 14 each 0  . potassium chloride SA (K-DUR,KLOR-CON) 20 MEQ tablet Take 1 tablet (20 mEq total) by mouth daily. 90 tablet 3  . vitamin B-12 1000 MCG tablet Take 1 tablet (1,000 mcg total) by mouth daily.    . vitamin C (ASCORBIC ACID) 500 MG tablet Take 500 mg by mouth daily.     No current facility-administered medications for this visit.     Allergies:   Tape    Social History:  The patient  reports that she quit smoking about 5 years ago. Her smoking use included Cigarettes. She has a  12.50 pack-year smoking history. She has never used smokeless tobacco. She reports that she does not drink alcohol or use drugs.   Family History:  The patient's family history includes Diabetes in her mother; Heart failure in her father and maternal aunt.    ROS: All other systems are reviewed and negative. Unless otherwise mentioned in H&P    PHYSICAL EXAM: VS:  BP 138/62   Pulse 80   Ht 5' 3"  (1.6 m)   Wt 131 lb (59.4 kg)   SpO2 98%   BMI 23.21 kg/m  , BMI Body mass index is 23.21 kg/m. GEN: Well nourished, well developed, in no acute distress Frail, skin is pale.  HEENT: normal  Neck: no JVD, carotid bruits, or masses Cardiac: RRR tachycardic,; no murmurs, rubs, or gallops,no edema    Respiratory:  clear to auscultation bilaterally, dyspneic at rest,  GI: soft, nontender, nondistended, + BS MS: no deformity or atrophy  Some edema left knee, but no warmth or erythema.  Skin: warm and dry, no rash. Pale Neuro:  Strength and sensation are intact Psych: euthymic mood, full affect   Recent Labs: 02/28/2017: TSH 0.667 03/01/2017: ALT 15; Magnesium 1.8 03/05/2017: BUN 14; Creatinine, Ser 0.65; Hemoglobin 8.9; Platelets 165; Potassium 3.6; Sodium 136    Lipid Panel    Component Value Date/Time   CHOL 98 03/02/2017 0559   CHOL 142 12/07/2013 1038   TRIG 78 03/02/2017 0559   TRIG 98 12/07/2013 1038   HDL 39 (L) 03/02/2017 0559   HDL 47 12/07/2013 1038   CHOLHDL 2.5 03/02/2017 0559   VLDL 16 03/02/2017 0559   LDLCALC 43 03/02/2017 0559   LDLCALC 75 12/07/2013 1038      Wt Readings from Last 3 Encounters:  03/23/17 131 lb (59.4 kg)  02/28/17 130 lb 1.1 oz (59 kg)  02/26/17 130 lb (59 kg)      Other studies Reviewed: Echocardiogram 2017/03/18 Left ventricle: The cavity size was normal. Wall thickness was   increased in a pattern of mild LVH. Systolic function was   vigorous. The estimated ejection fraction was in the range of 65%   to 70%. Wall motion was normal; there were no regional wall   motion abnormalities. The study is not technically sufficient to   allow evaluation of LV diastolic function. - Aortic valve: Calcified with restricted leaflet motion. There is   moderate stenosis and mild to moderate AI. Mean gradient (S): 17   mm Hg. Peak gradient (S): 32 mm Hg. Valve area (VTI): 1.48 cm^2.   Valve area (Vmax): 1.45 cm^2. Valve area (Vmean): 1.4 cm^2. - Mitral valve: Heavy MAC - there is mild stenosis. Trivial   regurgitation. Mean gradient (D): 6 mm Hg. - Left atrium: Severely dilated. - Tricuspid valve: There was trivial regurgitation. - Pulmonary arteries: PA peak pressure: 18 mm Hg (S). - Inferior vena cava: The vessel was normal in size. The    respirophasic diameter changes were in the normal range (>= 50%),   consistent with normal central venous pressure.  Cardiac Cath 09/26/2016  Conclusion     The left ventricular systolic function is normal.  LV end diastolic pressure is normal.  The left ventricular ejection fraction is 55-65% by visual estimate.  There is mild aortic valve stenosis.  Mid RCA lesion, 25 %stenosed.  Ost Ramus lesion, 30 %stenosed.    Essentially minimal coronary artery disease withlesions. There is calcification of the mitral valve annulus and mild callus complication the proximal LAD. There  does appear to be a stent in the RCA that is essentially patent.  Nothing to explain the patient's positive troponin.     ASSESSMENT AND PLAN:  1. Hypertension: BP is much better controlled now that medications have been stopped and adjusted. She is now only on coreg. She denies dizziness. She remains frail but is undergoing rehab at [facility].   2. Chest Pain: Described as pressure, constant while lying in bed. She was anemic and hypokalemic on last labs. I will review new labs when they are available from today to ascertain abnormalities are causing her symptoms.   3. Left knee pain: Will have left knee x-ray due to worsening pain and edema. This may be left over from recent surgery, but will check for new reasons for pain, dislocation, arthritis. Will send a copy to Dr. Aline Brochure.   4. Hx of Seizures: Now being followed by neurology.   5. Hx of CVA: Now being followed by neurology. Remains on ASA and statin.    Current medicines are reviewed at length with the patient today.    Labs/ tests ordered today include: requesting labs from [facility], drawn today.  Phill Myron. West Pugh, ANP, AACC   03/23/2017 3:35 PM    [facility] [address], [place], [place] [address] Phone: [phone]; Fax: [phone]

## 2017-03-23 ENCOUNTER — Encounter: Payer: Self-pay | Admitting: Adult Health

## 2017-03-23 ENCOUNTER — Ambulatory Visit (HOSPITAL_COMMUNITY)
Admission: RE | Admit: 2017-03-23 | Discharge: 2017-03-23 | Disposition: A | Discharge status: Home / Self Care | Payer: Medicare Other | Source: Ambulatory Visit | Attending: Adult Health | Admitting: Adult Health

## 2017-03-23 ENCOUNTER — Ambulatory Visit (INDEPENDENT_AMBULATORY_CARE_PROVIDER_SITE_OTHER): Payer: Medicare Other | Admitting: Adult Health

## 2017-03-23 VITALS — BP 138/62 | HR 80 | Ht 63.0 in | Wt 131.0 lb

## 2017-03-23 DIAGNOSIS — I10 Essential (primary) hypertension: Secondary | ICD-10-CM | POA: Diagnosis not present

## 2017-03-23 DIAGNOSIS — M7989 Other specified soft tissue disorders: Secondary | ICD-10-CM | POA: Diagnosis not present

## 2017-03-23 DIAGNOSIS — M25562 Pain in left knee: Secondary | ICD-10-CM | POA: Insufficient documentation

## 2017-03-23 DIAGNOSIS — Z9889 Other specified postprocedural states: Secondary | ICD-10-CM | POA: Insufficient documentation

## 2017-03-23 DIAGNOSIS — I63019 Cerebral infarction due to thrombosis of unspecified vertebral artery: Secondary | ICD-10-CM | POA: Diagnosis not present

## 2017-03-23 DIAGNOSIS — Z9689 Presence of other specified functional implants: Secondary | ICD-10-CM | POA: Diagnosis not present

## 2017-03-23 DIAGNOSIS — M1712 Unilateral primary osteoarthritis, left knee: Secondary | ICD-10-CM | POA: Insufficient documentation

## 2017-03-23 NOTE — Patient Instructions (Addendum)
Your physician recommends that you schedule a follow-up appointment in: 1 Month   Your physician recommends that you continue on your current medications as directed. Please refer to the Current Medication list given to you today.  Have Knee X-Ray Done Today   If you need a refill on your cardiac medications before your next appointment, please call your pharmacy.  Thank you for choosing [facility]!

## 2017-03-30 DIAGNOSIS — R569 Unspecified convulsions: Secondary | ICD-10-CM | POA: Diagnosis not present

## 2017-03-30 DIAGNOSIS — E119 Type 2 diabetes mellitus without complications: Secondary | ICD-10-CM | POA: Diagnosis not present

## 2017-03-30 DIAGNOSIS — I10 Essential (primary) hypertension: Secondary | ICD-10-CM | POA: Diagnosis not present

## 2017-03-30 DIAGNOSIS — E46 Unspecified protein-calorie malnutrition: Secondary | ICD-10-CM | POA: Diagnosis not present

## 2017-04-01 ENCOUNTER — Other Ambulatory Visit: Payer: Self-pay | Admitting: *Deleted

## 2017-04-01 NOTE — Patient Outreach (Signed)
[facility]) Care Management  04/01/2017  Nastassia Bazaldua 19-Jan-1941 [id number]   CSW called to follow-up on patient, confirmed that she is still at [facility] CSW was unable to reach patient at SNF, but left a voicemail for patient's Education officer, museum. CSW will await call back from SNF social worker for update.    Raynaldo Opitz, LCSW [facility]  Clinical Social Worker cell #: [phone]

## 2017-04-02 DIAGNOSIS — D509 Iron deficiency anemia, unspecified: Secondary | ICD-10-CM | POA: Diagnosis not present

## 2017-04-02 DIAGNOSIS — I10 Essential (primary) hypertension: Secondary | ICD-10-CM | POA: Diagnosis not present

## 2017-04-02 DIAGNOSIS — R569 Unspecified convulsions: Secondary | ICD-10-CM | POA: Diagnosis not present

## 2017-04-02 DIAGNOSIS — E46 Unspecified protein-calorie malnutrition: Secondary | ICD-10-CM | POA: Diagnosis not present

## 2017-04-09 ENCOUNTER — Encounter (HOSPITAL_COMMUNITY): Payer: Self-pay | Admitting: Orthopedic Surgery

## 2017-04-09 NOTE — Addendum Note (Signed)
Addendum  created 04/09/17 0918 by Verdie Drown, CRNA   Anesthesia Event edited, Anesthesia Staff edited

## 2017-04-13 DIAGNOSIS — D509 Iron deficiency anemia, unspecified: Secondary | ICD-10-CM | POA: Diagnosis not present

## 2017-04-13 DIAGNOSIS — I10 Essential (primary) hypertension: Secondary | ICD-10-CM | POA: Diagnosis not present

## 2017-04-13 DIAGNOSIS — R569 Unspecified convulsions: Secondary | ICD-10-CM | POA: Diagnosis not present

## 2017-04-13 DIAGNOSIS — E46 Unspecified protein-calorie malnutrition: Secondary | ICD-10-CM | POA: Diagnosis not present

## 2017-04-14 ENCOUNTER — Other Ambulatory Visit (HOSPITAL_COMMUNITY): Payer: Self-pay | Admitting: Orthopedic Surgery

## 2017-04-14 DIAGNOSIS — M21372 Foot drop, left foot: Secondary | ICD-10-CM

## 2017-04-14 DIAGNOSIS — S72102D Unspecified trochanteric fracture of left femur, subsequent encounter for closed fracture with routine healing: Secondary | ICD-10-CM | POA: Diagnosis not present

## 2017-04-15 ENCOUNTER — Ambulatory Visit: Payer: Self-pay | Admitting: *Deleted

## 2017-04-16 IMAGING — DX DG CHEST 2V
2 series · 2 of 2 positions shown · non-contrast
Comparison: 03/27/2016

CLINICAL DATA: Central chest pain since last night. Nausea this
morning.

EXAM:
CHEST  2 VIEW

[chest pa]
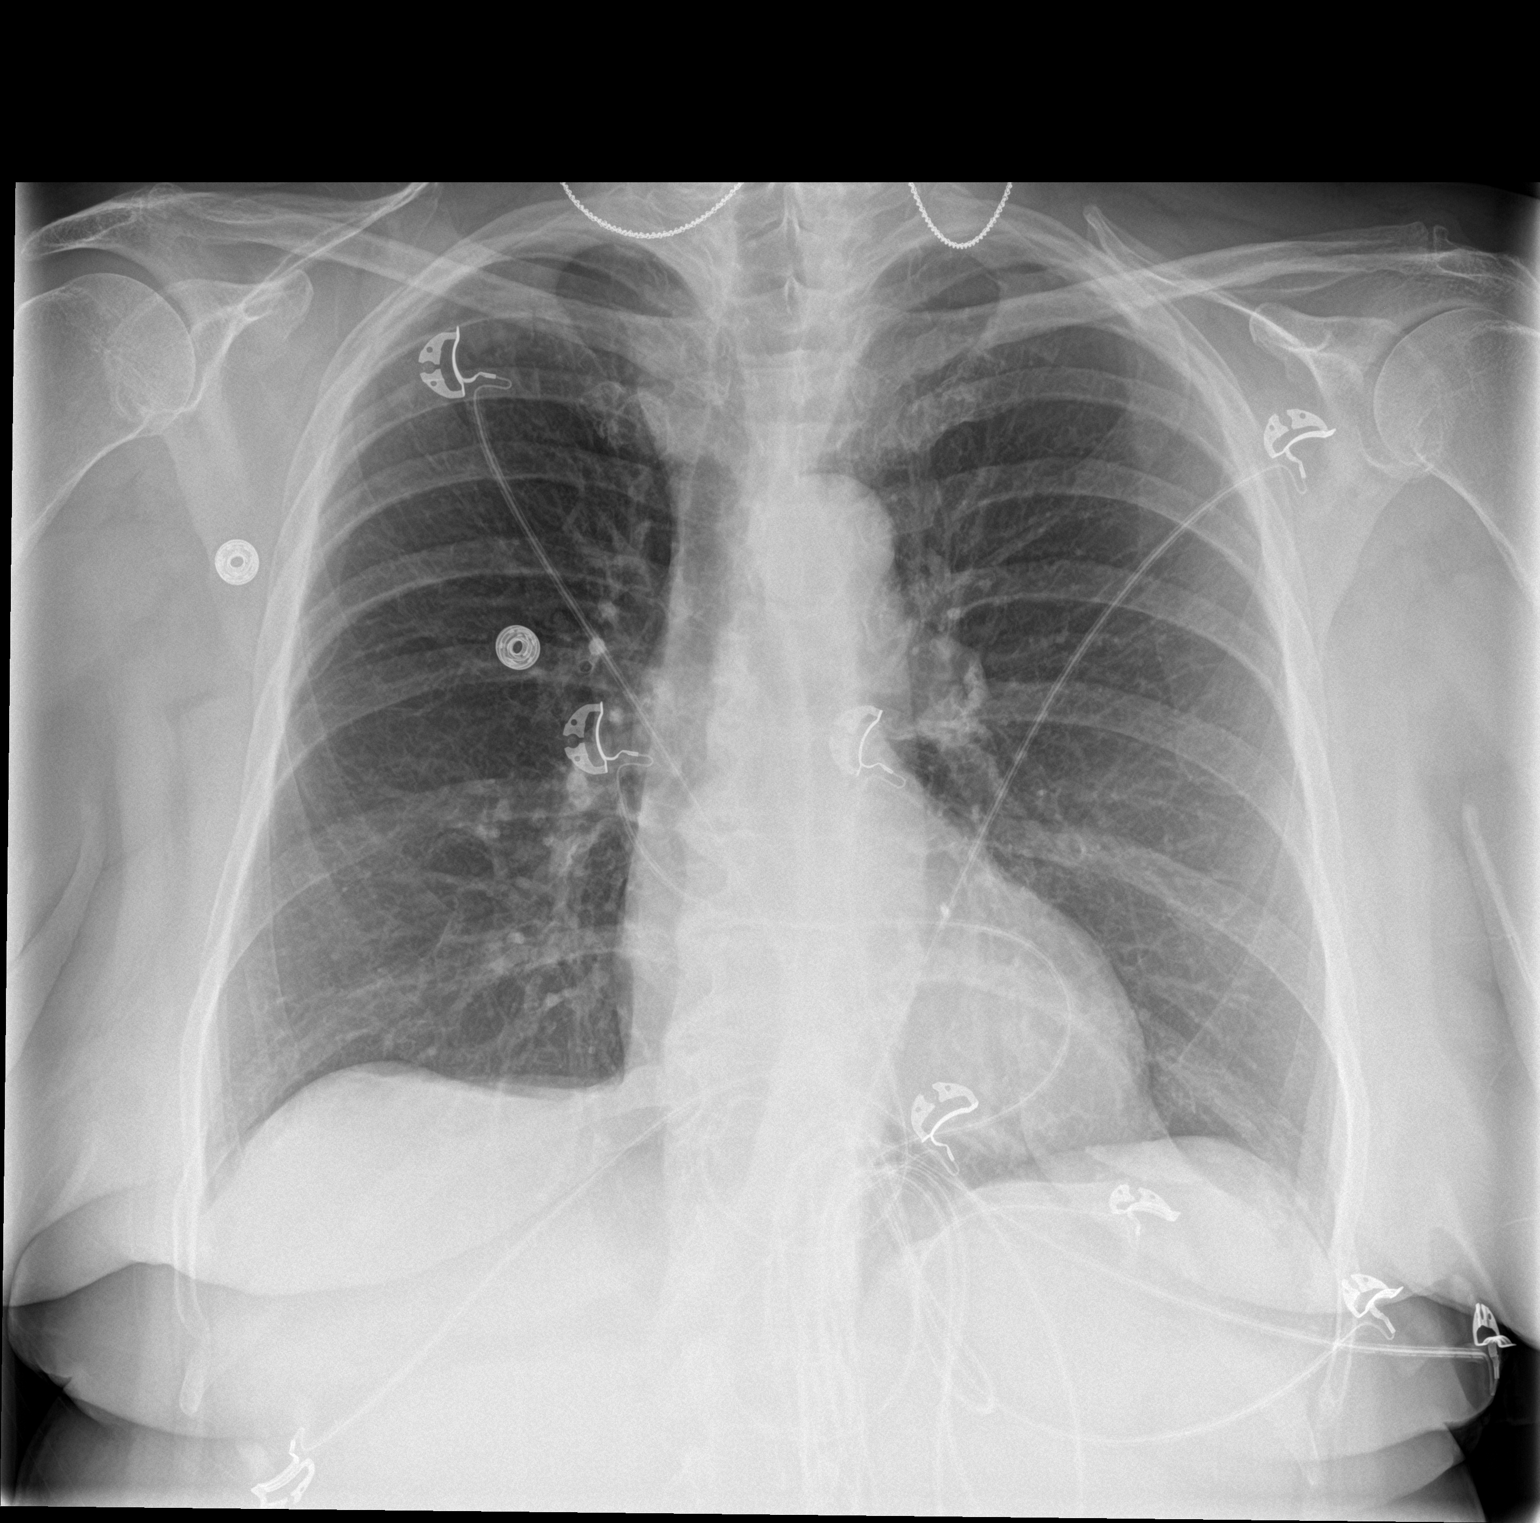

[chest lat]
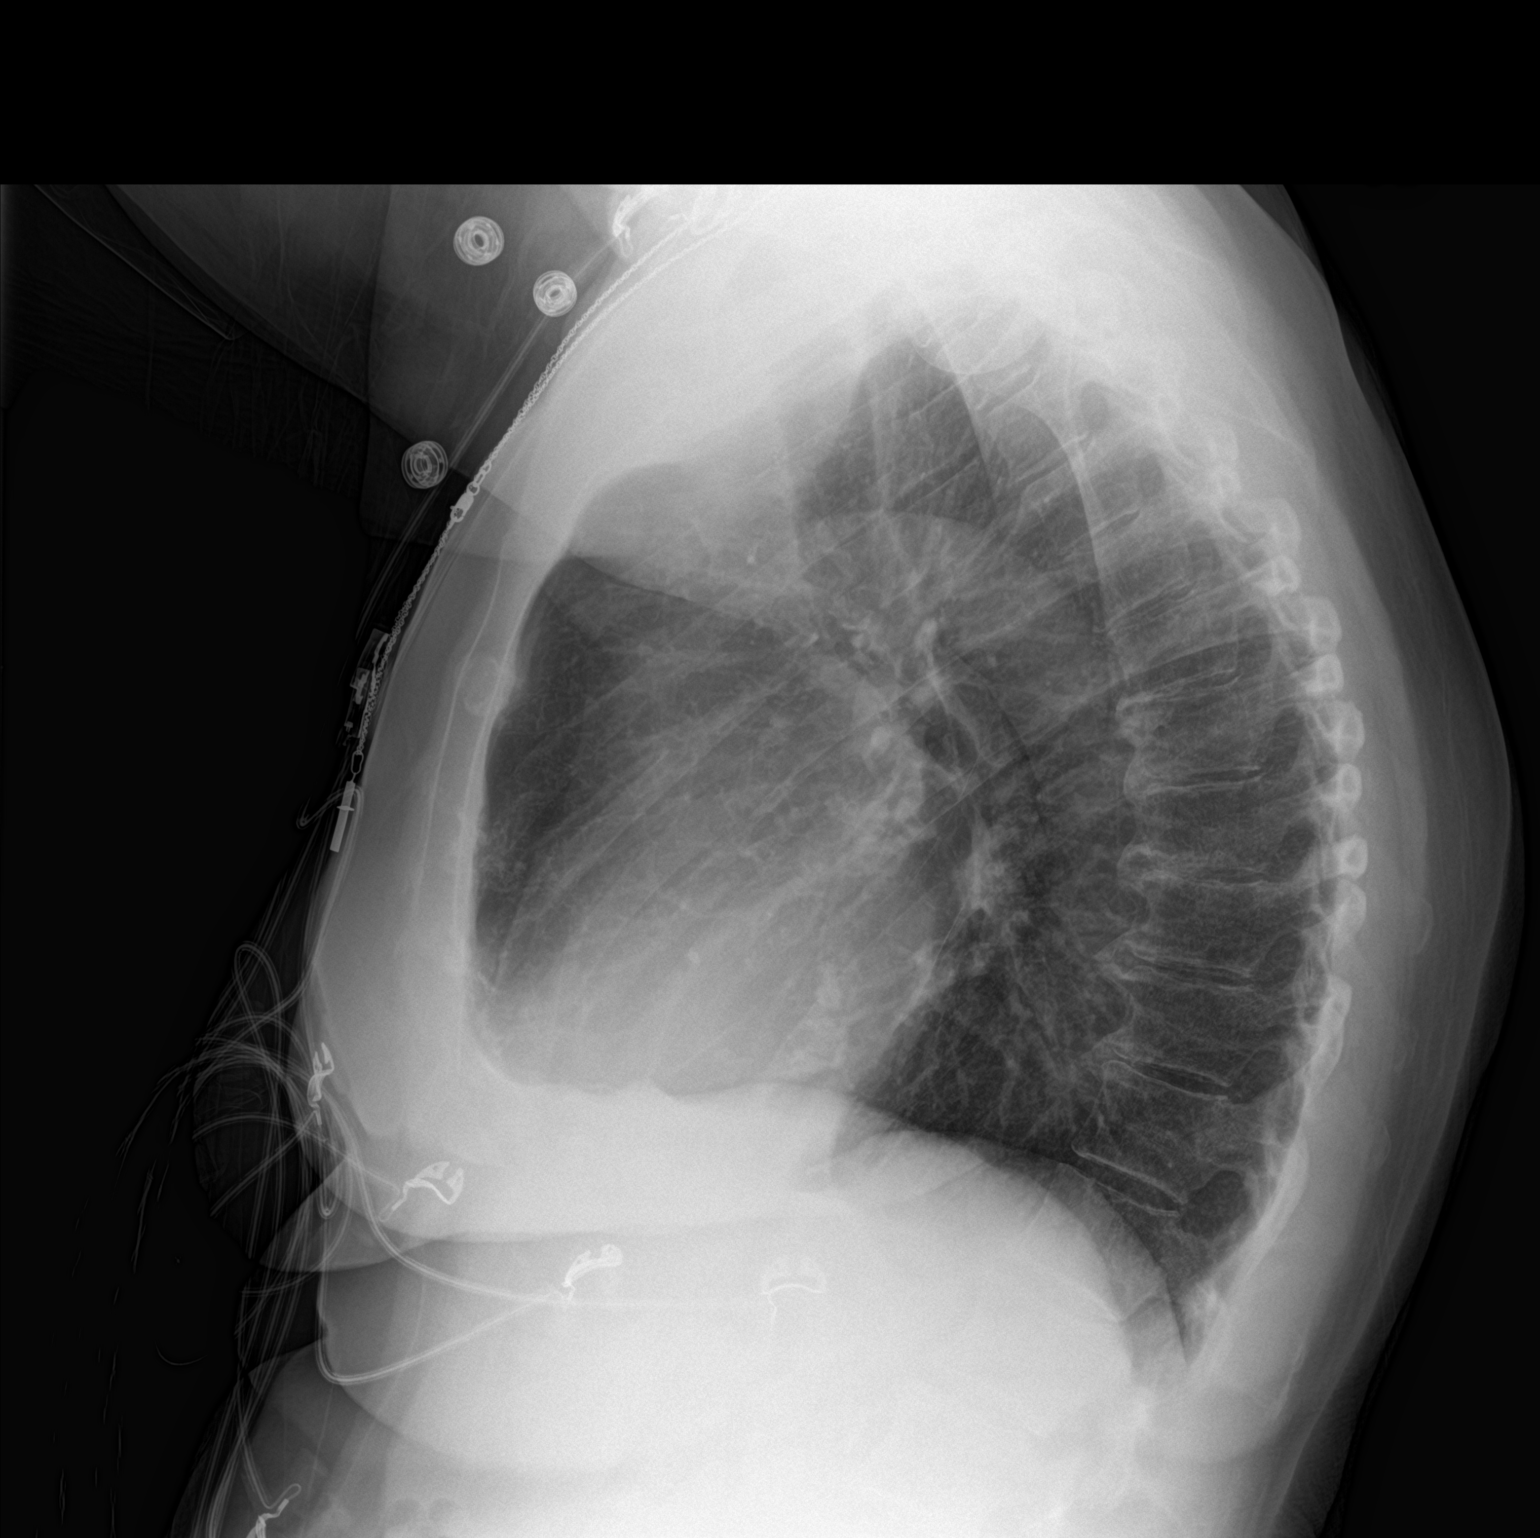

[2 of 2 positions shown; findings below may reference images not displayed]

FINDINGS: The cardiomediastinal silhouette is within normal limits. The lungs
are a mildly hyperinflated and clear. No pleural effusion or
pneumothorax is identified. Thoracic spondylosis is noted.
IMPRESSION: No active cardiopulmonary disease.

## 2017-04-18 IMAGING — CT CT ANGIO CHEST
2 of 6 series · 18 of 46 positions shown · IV contrast (isovue)
Comparison: 07/04/2011 CTA chest, CT abdomen and pelvis 07/09/2015

CLINICAL DATA: Chest pain for 1 day, diabetes mellitus, coronary
artery disease post MI and stenting, nonalcoholic fatty liver
disease, cirrhosis, hypertension, diabetes mellitus, prior stroke,
former smoker

EXAM:
CT ANGIOGRAPHY CHEST WITH CONTRAST
TECHNIQUE: Multidetector CT imaging of the chest was performed using the
standard protocol during bolus administration of intravenous
contrast. Multiplanar CT image reconstructions and MIPs were
obtained to evaluate the vascular anatomy.
CONTRAST:  100 cc Isovue 370 IV

[Series 5: thins · axial · 0.81mm/px · z∈[+1140,+1408]mm · 15 of 293 slices shown]
[im 13/293  lung]
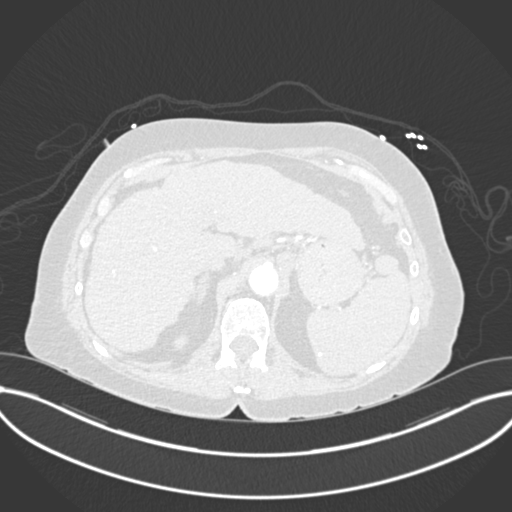
[im 39/293  soft-tissue]
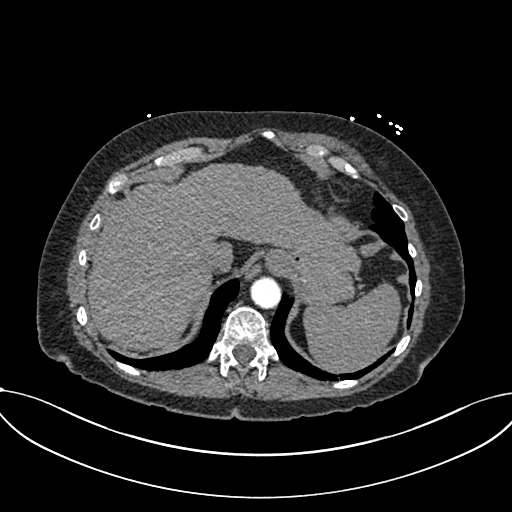
[im 51/293  lung]
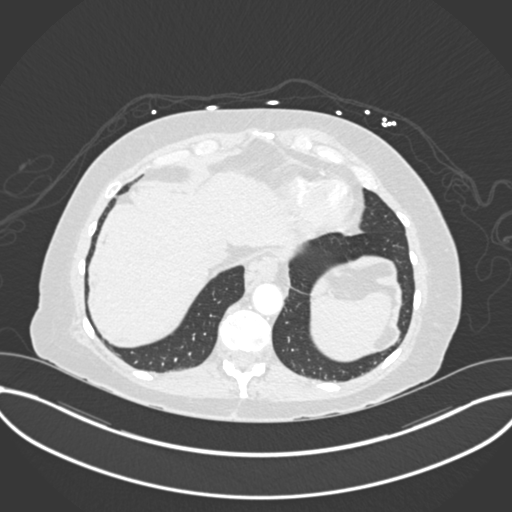
[im 77/293  soft-tissue]
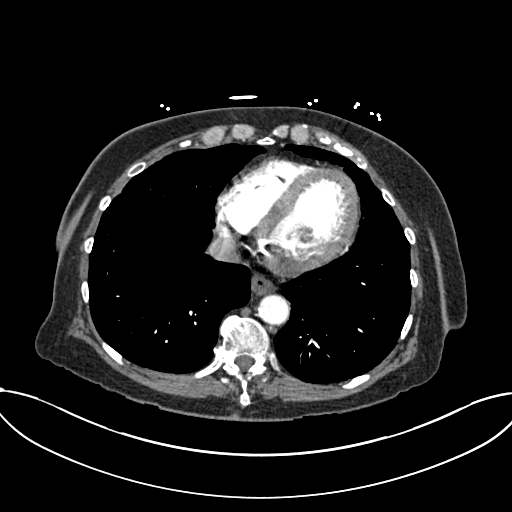
[im 89/293  lung]
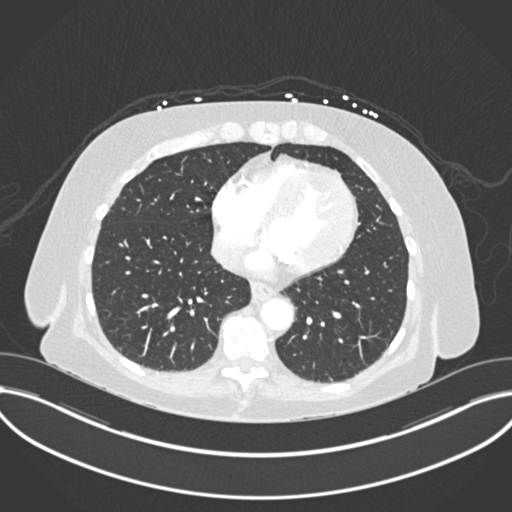
[im 115/293  soft-tissue]
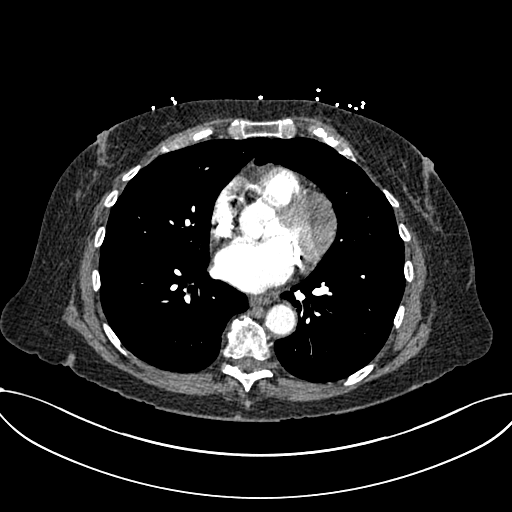
[im 127/293  lung]
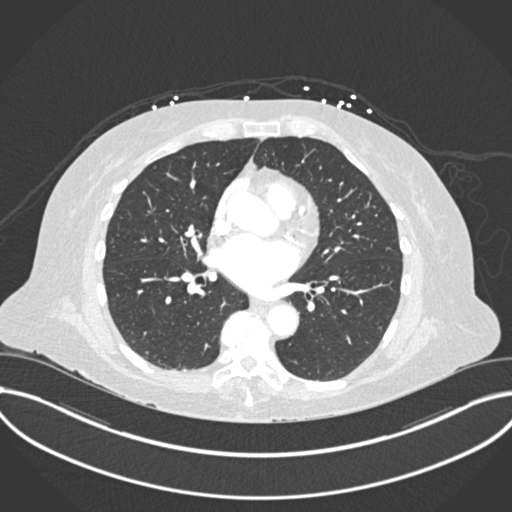
[im 153/293  soft-tissue]
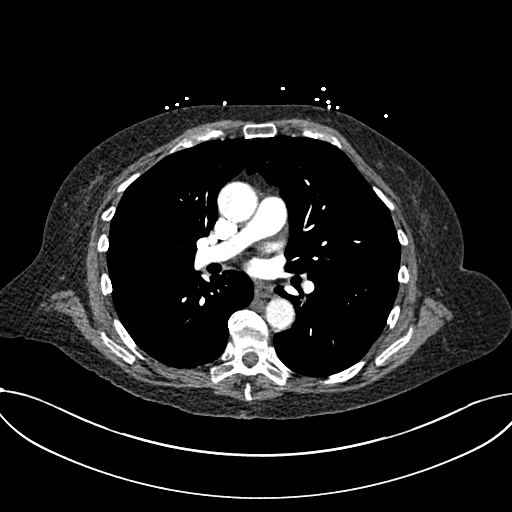
[im 166/293  lung]
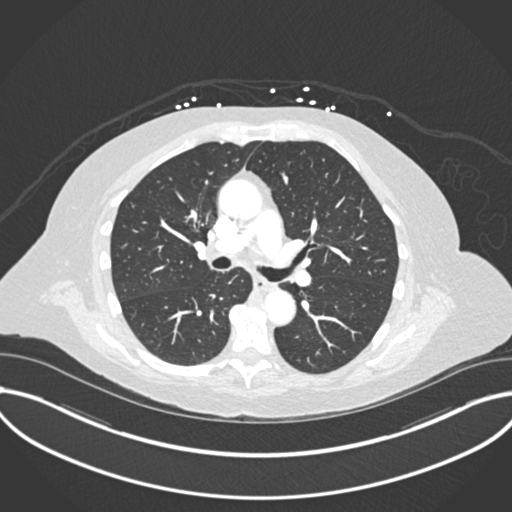
[im 178/293  soft-tissue]
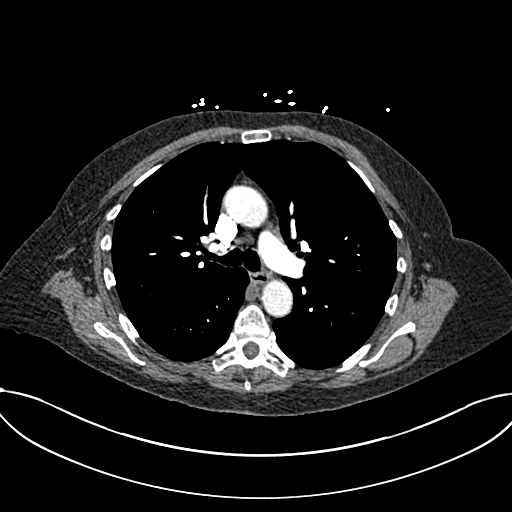
[im 204/293  lung]
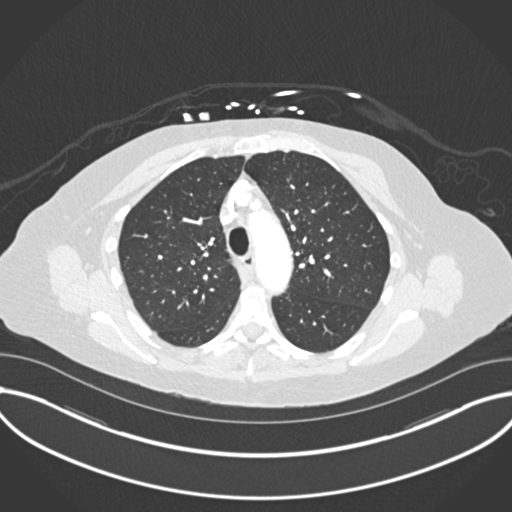
[im 216/293  soft-tissue]
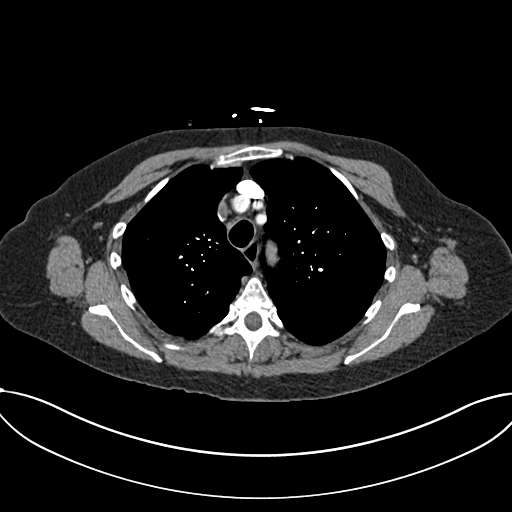
[im 242/293  lung]
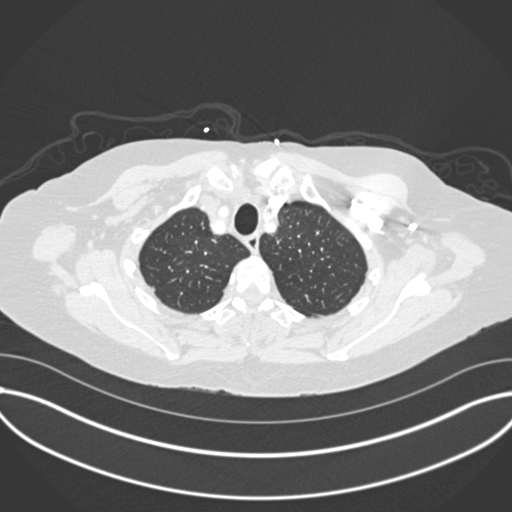
[im 254/293  soft-tissue]
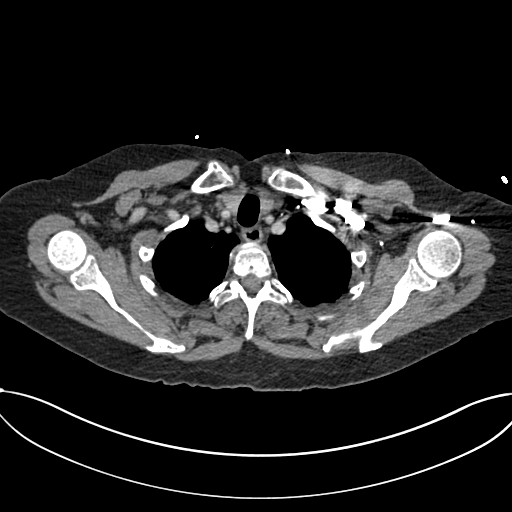
[im 280/293  lung]
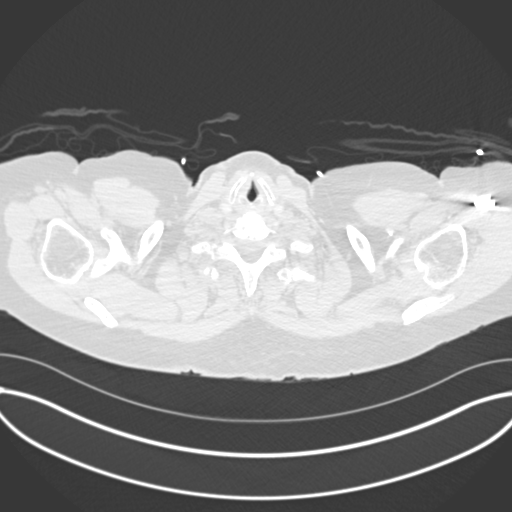

[Series 7: coronal mpr · coronal · 0.63mm/px · 3 of 150 slices shown]
[im 38/150  soft-tissue]
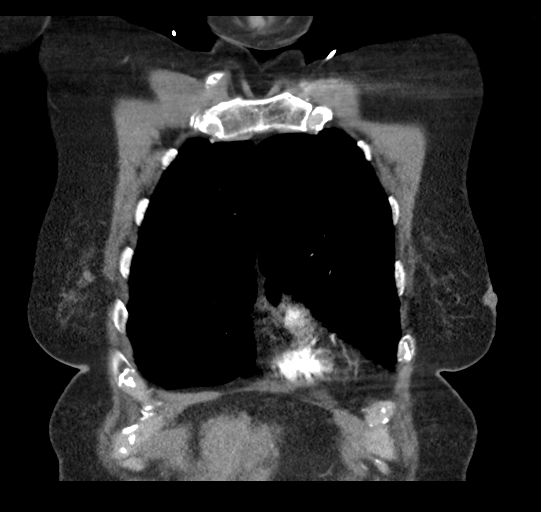
[im 75/150  soft-tissue]
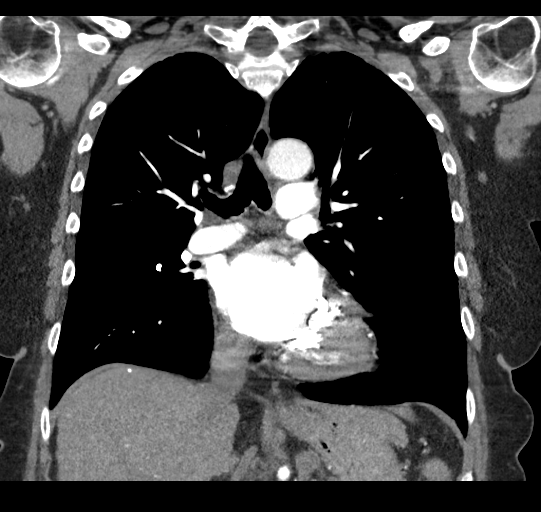
[im 112/150  soft-tissue]
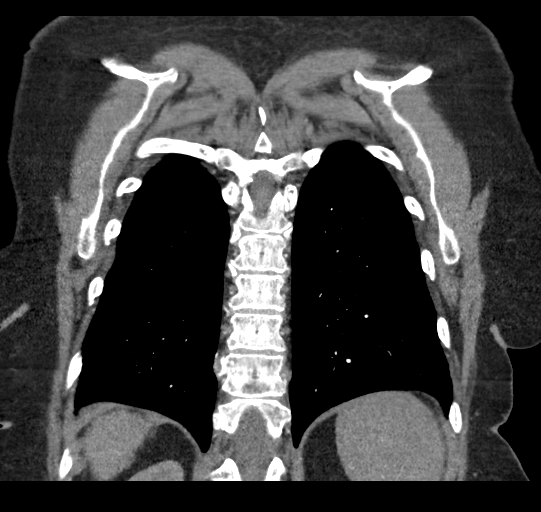

[18 of 46 positions shown; findings below may reference images not displayed]

FINDINGS: Cardiovascular: Atherosclerotic calcifications aorta, coronary
arteries and proximal great vessels. Aorta normal caliber without
aneurysm or dissection. No pericardial effusion. Pulmonary arteries
adequately opacified and patent. No evidence of pulmonary embolism.

Mediastinum/Nodes: Mild wall thickening of the distal thoracic
esophagus unchanged. Base of cervical region unremarkable. No
thoracic adenopathy.

Lungs/Pleura: Lungs clear. No pulmonary infiltrate, pleural
effusion, or pneumothorax. 8 mm noncalcified pulmonary nodule at
RIGHT diaphragm image 75 unchanged since 5390 and only minimally
larger than seen in 9239. Calcified granuloma RIGHT upper lobe image
54. Tiny nodular density LEFT lower lobe image 81 unchanged.

Upper Abdomen: Cirrhotic liver. Calcified granulomata within liver
and spleen. Lab stomach unable to exclude gastric wall thickening.
Perigastric varices.

Musculoskeletal: No acute osseous findings.

Review of the MIP images confirms the above findings.
IMPRESSION: No evidence of pulmonary embolism.

Cirrhotic liver with upper abdominal varices.

Old granulomatous disease.

Stable nodule at RIGHT diaphragm 8 mm diameter.

Aortic atherosclerosis and coronary arterial calcification.

Chronic wall thickening of the distal esophagus with adjacent
prominent nonenhanced blood vessels unable to exclude esophageal
varices.

## 2017-04-20 DIAGNOSIS — Z9181 History of falling: Secondary | ICD-10-CM | POA: Diagnosis not present

## 2017-04-20 DIAGNOSIS — R569 Unspecified convulsions: Secondary | ICD-10-CM | POA: Diagnosis not present

## 2017-04-20 DIAGNOSIS — S72442A Displaced fracture of lower epiphysis (separation) of left femur, initial encounter for closed fracture: Secondary | ICD-10-CM | POA: Diagnosis not present

## 2017-04-21 ENCOUNTER — Emergency Department (HOSPITAL_COMMUNITY): Payer: Medicare Other

## 2017-04-21 ENCOUNTER — Encounter (HOSPITAL_COMMUNITY): Payer: Self-pay | Admitting: Emergency Medicine

## 2017-04-21 ENCOUNTER — Inpatient Hospital Stay (HOSPITAL_COMMUNITY): Payer: Medicare Other

## 2017-04-21 ENCOUNTER — Inpatient Hospital Stay (HOSPITAL_COMMUNITY)
Admission: EM | Admit: 2017-04-21 | Discharge: 2017-04-25 | DRG: 481 | Disposition: A | Discharge status: Skilled Nursing Facility | Payer: Medicare Other | Attending: Family Medicine | Admitting: Family Medicine

## 2017-04-21 ENCOUNTER — Encounter (HOSPITAL_COMMUNITY): Payer: Self-pay

## 2017-04-21 ENCOUNTER — Ambulatory Visit (HOSPITAL_COMMUNITY): Payer: Medicare Other

## 2017-04-21 DIAGNOSIS — E119 Type 2 diabetes mellitus without complications: Secondary | ICD-10-CM | POA: Diagnosis not present

## 2017-04-21 DIAGNOSIS — I352 Nonrheumatic aortic (valve) stenosis with insufficiency: Secondary | ICD-10-CM | POA: Diagnosis present

## 2017-04-21 DIAGNOSIS — S72342D Displaced spiral fracture of shaft of left femur, subsequent encounter for closed fracture with routine healing: Secondary | ICD-10-CM

## 2017-04-21 DIAGNOSIS — E785 Hyperlipidemia, unspecified: Secondary | ICD-10-CM | POA: Diagnosis present

## 2017-04-21 DIAGNOSIS — G40909 Epilepsy, unspecified, not intractable, without status epilepticus: Secondary | ICD-10-CM | POA: Diagnosis present

## 2017-04-21 DIAGNOSIS — M4844XD Fatigue fracture of vertebra, thoracic region, subsequent encounter for fracture with routine healing: Secondary | ICD-10-CM | POA: Diagnosis not present

## 2017-04-21 DIAGNOSIS — K219 Gastro-esophageal reflux disease without esophagitis: Secondary | ICD-10-CM | POA: Diagnosis not present

## 2017-04-21 DIAGNOSIS — I10 Essential (primary) hypertension: Secondary | ICD-10-CM | POA: Diagnosis not present

## 2017-04-21 DIAGNOSIS — Y92121 Bathroom in nursing home as the place of occurrence of the external cause: Secondary | ICD-10-CM | POA: Diagnosis not present

## 2017-04-21 DIAGNOSIS — D6959 Other secondary thrombocytopenia: Secondary | ICD-10-CM | POA: Diagnosis not present

## 2017-04-21 DIAGNOSIS — M79652 Pain in left thigh: Secondary | ICD-10-CM | POA: Diagnosis present

## 2017-04-21 DIAGNOSIS — T84115A Breakdown (mechanical) of internal fixation device of left femur, initial encounter: Secondary | ICD-10-CM | POA: Diagnosis not present

## 2017-04-21 DIAGNOSIS — R918 Other nonspecific abnormal finding of lung field: Secondary | ICD-10-CM | POA: Diagnosis not present

## 2017-04-21 DIAGNOSIS — M810 Age-related osteoporosis without current pathological fracture: Secondary | ICD-10-CM | POA: Diagnosis not present

## 2017-04-21 DIAGNOSIS — D696 Thrombocytopenia, unspecified: Secondary | ICD-10-CM | POA: Diagnosis not present

## 2017-04-21 DIAGNOSIS — S72402A Unspecified fracture of lower end of left femur, initial encounter for closed fracture: Secondary | ICD-10-CM

## 2017-04-21 DIAGNOSIS — I6523 Occlusion and stenosis of bilateral carotid arteries: Secondary | ICD-10-CM | POA: Diagnosis present

## 2017-04-21 DIAGNOSIS — W010XXA Fall on same level from slipping, tripping and stumbling without subsequent striking against object, initial encounter: Secondary | ICD-10-CM | POA: Diagnosis present

## 2017-04-21 DIAGNOSIS — B9620 Unspecified Escherichia coli [E. coli] as the cause of diseases classified elsewhere: Secondary | ICD-10-CM | POA: Diagnosis not present

## 2017-04-21 DIAGNOSIS — M9712XA Periprosthetic fracture around internal prosthetic left knee joint, initial encounter: Secondary | ICD-10-CM | POA: Diagnosis not present

## 2017-04-21 DIAGNOSIS — Z419 Encounter for procedure for purposes other than remedying health state, unspecified: Secondary | ICD-10-CM

## 2017-04-21 DIAGNOSIS — T148XXA Other injury of unspecified body region, initial encounter: Secondary | ICD-10-CM

## 2017-04-21 DIAGNOSIS — K7460 Unspecified cirrhosis of liver: Secondary | ICD-10-CM | POA: Diagnosis not present

## 2017-04-21 DIAGNOSIS — M9702XA Periprosthetic fracture around internal prosthetic left hip joint, initial encounter: Principal | ICD-10-CM | POA: Diagnosis present

## 2017-04-21 DIAGNOSIS — Z7984 Long term (current) use of oral hypoglycemic drugs: Secondary | ICD-10-CM | POA: Diagnosis not present

## 2017-04-21 DIAGNOSIS — Z87891 Personal history of nicotine dependence: Secondary | ICD-10-CM

## 2017-04-21 DIAGNOSIS — Z7901 Long term (current) use of anticoagulants: Secondary | ICD-10-CM

## 2017-04-21 DIAGNOSIS — S72342A Displaced spiral fracture of shaft of left femur, initial encounter for closed fracture: Secondary | ICD-10-CM

## 2017-04-21 DIAGNOSIS — G8911 Acute pain due to trauma: Secondary | ICD-10-CM | POA: Diagnosis not present

## 2017-04-21 DIAGNOSIS — Z01818 Encounter for other preprocedural examination: Secondary | ICD-10-CM | POA: Diagnosis not present

## 2017-04-21 DIAGNOSIS — G4700 Insomnia, unspecified: Secondary | ICD-10-CM | POA: Diagnosis not present

## 2017-04-21 DIAGNOSIS — S72392A Other fracture of shaft of left femur, initial encounter for closed fracture: Secondary | ICD-10-CM | POA: Diagnosis not present

## 2017-04-21 DIAGNOSIS — I739 Peripheral vascular disease, unspecified: Secondary | ICD-10-CM | POA: Diagnosis not present

## 2017-04-21 DIAGNOSIS — S72145D Nondisplaced intertrochanteric fracture of left femur, subsequent encounter for closed fracture with routine healing: Secondary | ICD-10-CM | POA: Diagnosis not present

## 2017-04-21 DIAGNOSIS — Z8673 Personal history of transient ischemic attack (TIA), and cerebral infarction without residual deficits: Secondary | ICD-10-CM

## 2017-04-21 DIAGNOSIS — E1151 Type 2 diabetes mellitus with diabetic peripheral angiopathy without gangrene: Secondary | ICD-10-CM | POA: Diagnosis not present

## 2017-04-21 DIAGNOSIS — N390 Urinary tract infection, site not specified: Secondary | ICD-10-CM | POA: Diagnosis present

## 2017-04-21 DIAGNOSIS — I471 Supraventricular tachycardia: Secondary | ICD-10-CM | POA: Diagnosis present

## 2017-04-21 DIAGNOSIS — Z01810 Encounter for preprocedural cardiovascular examination: Secondary | ICD-10-CM

## 2017-04-21 DIAGNOSIS — R2681 Unsteadiness on feet: Secondary | ICD-10-CM | POA: Diagnosis not present

## 2017-04-21 DIAGNOSIS — Z79899 Other long term (current) drug therapy: Secondary | ICD-10-CM

## 2017-04-21 DIAGNOSIS — Z87440 Personal history of urinary (tract) infections: Secondary | ICD-10-CM

## 2017-04-21 DIAGNOSIS — Z7982 Long term (current) use of aspirin: Secondary | ICD-10-CM

## 2017-04-21 DIAGNOSIS — K449 Diaphragmatic hernia without obstruction or gangrene: Secondary | ICD-10-CM | POA: Diagnosis present

## 2017-04-21 DIAGNOSIS — Q660 Congenital talipes equinovarus: Secondary | ICD-10-CM | POA: Diagnosis not present

## 2017-04-21 DIAGNOSIS — E7800 Pure hypercholesterolemia, unspecified: Secondary | ICD-10-CM | POA: Diagnosis present

## 2017-04-21 DIAGNOSIS — S72452A Displaced supracondylar fracture without intracondylar extension of lower end of left femur, initial encounter for closed fracture: Secondary | ICD-10-CM | POA: Diagnosis not present

## 2017-04-21 DIAGNOSIS — M4844XA Fatigue fracture of vertebra, thoracic region, initial encounter for fracture: Secondary | ICD-10-CM | POA: Diagnosis not present

## 2017-04-21 DIAGNOSIS — I252 Old myocardial infarction: Secondary | ICD-10-CM | POA: Diagnosis not present

## 2017-04-21 DIAGNOSIS — E8889 Other specified metabolic disorders: Secondary | ICD-10-CM | POA: Diagnosis not present

## 2017-04-21 DIAGNOSIS — D649 Anemia, unspecified: Secondary | ICD-10-CM | POA: Diagnosis not present

## 2017-04-21 DIAGNOSIS — Y9301 Activity, walking, marching and hiking: Secondary | ICD-10-CM | POA: Diagnosis present

## 2017-04-21 DIAGNOSIS — I2510 Atherosclerotic heart disease of native coronary artery without angina pectoris: Secondary | ICD-10-CM | POA: Diagnosis present

## 2017-04-21 DIAGNOSIS — G4733 Obstructive sleep apnea (adult) (pediatric): Secondary | ICD-10-CM | POA: Diagnosis present

## 2017-04-21 DIAGNOSIS — Y92009 Unspecified place in unspecified non-institutional (private) residence as the place of occurrence of the external cause: Secondary | ICD-10-CM | POA: Diagnosis not present

## 2017-04-21 DIAGNOSIS — N3000 Acute cystitis without hematuria: Secondary | ICD-10-CM | POA: Diagnosis not present

## 2017-04-21 DIAGNOSIS — S7292XA Unspecified fracture of left femur, initial encounter for closed fracture: Secondary | ICD-10-CM | POA: Diagnosis present

## 2017-04-21 DIAGNOSIS — D62 Acute posthemorrhagic anemia: Secondary | ICD-10-CM | POA: Diagnosis not present

## 2017-04-21 DIAGNOSIS — S7292XB Unspecified fracture of left femur, initial encounter for open fracture type I or II: Secondary | ICD-10-CM | POA: Diagnosis not present

## 2017-04-21 DIAGNOSIS — S728X2A Other fracture of left femur, initial encounter for closed fracture: Secondary | ICD-10-CM | POA: Diagnosis not present

## 2017-04-21 DIAGNOSIS — Z9181 History of falling: Secondary | ICD-10-CM | POA: Diagnosis not present

## 2017-04-21 DIAGNOSIS — S72452D Displaced supracondylar fracture without intracondylar extension of lower end of left femur, subsequent encounter for closed fracture with routine healing: Secondary | ICD-10-CM | POA: Diagnosis not present

## 2017-04-21 DIAGNOSIS — K7581 Nonalcoholic steatohepatitis (NASH): Secondary | ICD-10-CM | POA: Diagnosis present

## 2017-04-21 DIAGNOSIS — I351 Nonrheumatic aortic (valve) insufficiency: Secondary | ICD-10-CM | POA: Diagnosis present

## 2017-04-21 DIAGNOSIS — S72142A Displaced intertrochanteric fracture of left femur, initial encounter for closed fracture: Secondary | ICD-10-CM | POA: Diagnosis not present

## 2017-04-21 DIAGNOSIS — R569 Unspecified convulsions: Secondary | ICD-10-CM | POA: Diagnosis not present

## 2017-04-21 DIAGNOSIS — Z91048 Other nonmedicinal substance allergy status: Secondary | ICD-10-CM

## 2017-04-21 DIAGNOSIS — Z4789 Encounter for other orthopedic aftercare: Secondary | ICD-10-CM | POA: Diagnosis not present

## 2017-04-21 DIAGNOSIS — W19XXXA Unspecified fall, initial encounter: Secondary | ICD-10-CM

## 2017-04-21 DIAGNOSIS — M6281 Muscle weakness (generalized): Secondary | ICD-10-CM | POA: Diagnosis not present

## 2017-04-21 DIAGNOSIS — Z955 Presence of coronary angioplasty implant and graft: Secondary | ICD-10-CM

## 2017-04-21 DIAGNOSIS — S72352A Displaced comminuted fracture of shaft of left femur, initial encounter for closed fracture: Secondary | ICD-10-CM | POA: Diagnosis not present

## 2017-04-21 DIAGNOSIS — S72012A Unspecified intracapsular fracture of left femur, initial encounter for closed fracture: Secondary | ICD-10-CM | POA: Diagnosis not present

## 2017-04-21 DIAGNOSIS — M9702XS Periprosthetic fracture around internal prosthetic left hip joint, sequela: Secondary | ICD-10-CM | POA: Diagnosis not present

## 2017-04-21 DIAGNOSIS — R262 Difficulty in walking, not elsewhere classified: Secondary | ICD-10-CM | POA: Diagnosis not present

## 2017-04-21 DIAGNOSIS — S728X9A Other fracture of unspecified femur, initial encounter for closed fracture: Secondary | ICD-10-CM | POA: Diagnosis not present

## 2017-04-21 HISTORY — DX: Unspecified fracture of left femur, initial encounter for closed fracture: S72.92XA

## 2017-04-21 LAB — CBC WITH DIFFERENTIAL/PLATELET
BASOS ABS: 0 10*3/uL (ref 0.0–0.1)
Basophils Relative: 0 %
EOS PCT: 1 %
Eosinophils Absolute: 0.1 10*3/uL (ref 0.0–0.7)
HEMATOCRIT: 31.2 % — AB (ref 36.0–46.0)
Hemoglobin: 10.7 g/dL — ABNORMAL LOW (ref 12.0–15.0)
LYMPHS PCT: 13 %
Lymphs Abs: 1.3 10*3/uL (ref 0.7–4.0)
MCH: 31.7 pg (ref 26.0–34.0)
MCHC: 34.3 g/dL (ref 30.0–36.0)
MCV: 92.3 fL (ref 78.0–100.0)
Monocytes Absolute: 1 10*3/uL (ref 0.1–1.0)
Monocytes Relative: 11 %
NEUTROS ABS: 7.5 10*3/uL (ref 1.7–7.7)
NEUTROS PCT: 75 %
PLATELETS: 129 10*3/uL — AB (ref 150–400)
RBC: 3.38 MIL/uL — AB (ref 3.87–5.11)
RDW: 14.3 % (ref 11.5–15.5)
WBC: 9.9 10*3/uL (ref 4.0–10.5)

## 2017-04-21 LAB — CBC
HCT: 31.8 % — ABNORMAL LOW (ref 36.0–46.0)
HEMOGLOBIN: 10.6 g/dL — AB (ref 12.0–15.0)
MCH: 30.6 pg (ref 26.0–34.0)
MCHC: 33.3 g/dL (ref 30.0–36.0)
MCV: 91.9 fL (ref 78.0–100.0)
PLATELETS: 137 10*3/uL — AB (ref 150–400)
RBC: 3.46 MIL/uL — AB (ref 3.87–5.11)
RDW: 14.3 % (ref 11.5–15.5)
WBC: 8 10*3/uL (ref 4.0–10.5)

## 2017-04-21 LAB — URINALYSIS, ROUTINE W REFLEX MICROSCOPIC
Glucose, UA: NEGATIVE mg/dL
Hgb urine dipstick: NEGATIVE
Ketones, ur: NEGATIVE mg/dL
Nitrite: POSITIVE — AB
PH: 5 (ref 5.0–8.0)
Protein, ur: NEGATIVE mg/dL
SPECIFIC GRAVITY, URINE: 1.027 (ref 1.005–1.030)

## 2017-04-21 LAB — GLUCOSE, CAPILLARY
GLUCOSE-CAPILLARY: 129 mg/dL — AB (ref 65–99)
GLUCOSE-CAPILLARY: 136 mg/dL — AB (ref 65–99)
Glucose-Capillary: 164 mg/dL — ABNORMAL HIGH (ref 65–99)
Glucose-Capillary: 93 mg/dL (ref 65–99)

## 2017-04-21 LAB — COMPREHENSIVE METABOLIC PANEL
ALK PHOS: 241 U/L — AB (ref 38–126)
ALT: 38 U/L (ref 14–54)
AST: 34 U/L (ref 15–41)
Albumin: 2.5 g/dL — ABNORMAL LOW (ref 3.5–5.0)
Anion gap: 8 (ref 5–15)
BILIRUBIN TOTAL: 1.1 mg/dL (ref 0.3–1.2)
BUN: 13 mg/dL (ref 6–20)
CALCIUM: 8.2 mg/dL — AB (ref 8.9–10.3)
CO2: 24 mmol/L (ref 22–32)
Chloride: 103 mmol/L (ref 101–111)
Creatinine, Ser: 0.58 mg/dL (ref 0.44–1.00)
GFR calc Af Amer: >60 mL/min (ref >60)
GLUCOSE: 115 mg/dL — AB (ref 65–99)
POTASSIUM: 3.9 mmol/L (ref 3.5–5.1)
Sodium: 135 mmol/L (ref 135–145)
TOTAL PROTEIN: 4.6 g/dL — AB (ref 6.5–8.1)

## 2017-04-21 LAB — BASIC METABOLIC PANEL
ANION GAP: 7 (ref 5–15)
BUN: 15 mg/dL (ref 6–20)
CO2: 23 mmol/L (ref 22–32)
Calcium: 8.5 mg/dL — ABNORMAL LOW (ref 8.9–10.3)
Chloride: 104 mmol/L (ref 101–111)
Creatinine, Ser: 0.53 mg/dL (ref 0.44–1.00)
GFR calc Af Amer: >60 mL/min (ref >60)
GLUCOSE: 139 mg/dL — AB (ref 65–99)
POTASSIUM: 4.3 mmol/L (ref 3.5–5.1)
Sodium: 134 mmol/L — ABNORMAL LOW (ref 135–145)

## 2017-04-21 LAB — TYPE AND SCREEN
ABO/RH(D): A POS
Antibody Screen: NEGATIVE

## 2017-04-21 LAB — ABO/RH: ABO/RH(D): A POS

## 2017-04-21 LAB — PHENYTOIN LEVEL, TOTAL: Phenytoin Lvl: 10.6 ug/mL (ref 10.0–20.0)

## 2017-04-21 LAB — SURGICAL PCR SCREEN
MRSA, PCR: POSITIVE — AB
Staphylococcus aureus: POSITIVE — AB

## 2017-04-21 MED ORDER — PHENYTOIN SODIUM EXTENDED 100 MG PO CAPS
100.0000 mg | ORAL_CAPSULE | Freq: Two times a day (BID) | ORAL | Status: DC
  Administered 2017-04-21 – 2017-04-25 (×7): 100 mg via ORAL
  Filled 2017-04-21 (×7): qty 1

## 2017-04-21 MED ORDER — HEPARIN SODIUM (PORCINE) 5000 UNIT/ML IJ SOLN
5000.0000 [IU] | Freq: Three times a day (TID) | INTRAMUSCULAR | Status: DC
  Administered 2017-04-21 – 2017-04-25 (×10): 5000 [IU] via SUBCUTANEOUS
  Filled 2017-04-21 (×11): qty 1

## 2017-04-21 MED ORDER — MORPHINE SULFATE (PF) 4 MG/ML IV SOLN
0.5000 mg | INTRAVENOUS | Status: DC | PRN
  Administered 2017-04-21 – 2017-04-24 (×12): 0.52 mg via INTRAVENOUS
  Filled 2017-04-21 (×14): qty 1

## 2017-04-21 MED ORDER — HYDROCODONE-ACETAMINOPHEN 5-325 MG PO TABS
1.0000 | ORAL_TABLET | Freq: Four times a day (QID) | ORAL | Status: DC | PRN
  Administered 2017-04-21 – 2017-04-22 (×7): 2 via ORAL
  Administered 2017-04-24 – 2017-04-25 (×5): 1 via ORAL
  Filled 2017-04-21: qty 1
  Filled 2017-04-21 (×4): qty 2
  Filled 2017-04-21 (×2): qty 1
  Filled 2017-04-21: qty 2
  Filled 2017-04-21 (×2): qty 1
  Filled 2017-04-21 (×2): qty 2

## 2017-04-21 MED ORDER — CHLORHEXIDINE GLUCONATE CLOTH 2 % EX PADS
6.0000 | MEDICATED_PAD | Freq: Every day | CUTANEOUS | Status: DC
  Administered 2017-04-21 – 2017-04-25 (×4): 6 via TOPICAL

## 2017-04-21 MED ORDER — DEXTROSE 5 % IV SOLN
1.0000 g | Freq: Once | INTRAVENOUS | Status: AC
  Administered 2017-04-21: 1 g via INTRAVENOUS
  Filled 2017-04-21: qty 10

## 2017-04-21 MED ORDER — KETOROLAC TROMETHAMINE 15 MG/ML IJ SOLN
15.0000 mg | Freq: Once | INTRAMUSCULAR | Status: AC
  Administered 2017-04-21: 15 mg via INTRAVENOUS
  Filled 2017-04-21: qty 1

## 2017-04-21 MED ORDER — MORPHINE SULFATE (PF) 2 MG/ML IV SOLN
2.0000 mg | Freq: Once | INTRAVENOUS | Status: AC
  Administered 2017-04-21: 2 mg via INTRAVENOUS
  Filled 2017-04-21: qty 1

## 2017-04-21 MED ORDER — ONDANSETRON HCL 4 MG/2ML IJ SOLN
4.0000 mg | Freq: Once | INTRAMUSCULAR | Status: AC
Start: 2017-04-21 — End: 2017-04-21
  Administered 2017-04-21: 4 mg via INTRAVENOUS
  Filled 2017-04-21: qty 2

## 2017-04-21 MED ORDER — POTASSIUM CHLORIDE CRYS ER 20 MEQ PO TBCR
20.0000 meq | EXTENDED_RELEASE_TABLET | Freq: Every day | ORAL | Status: DC
  Administered 2017-04-21 – 2017-04-25 (×4): 20 meq via ORAL
  Filled 2017-04-21 (×4): qty 1

## 2017-04-21 MED ORDER — DEXTROSE 5 % IV SOLN
1.0000 g | INTRAVENOUS | Status: DC
  Administered 2017-04-22 – 2017-04-25 (×3): 1 g via INTRAVENOUS
  Filled 2017-04-21 (×4): qty 10

## 2017-04-21 MED ORDER — GLUCERNA SHAKE PO LIQD
237.0000 mL | Freq: Two times a day (BID) | ORAL | Status: DC
  Administered 2017-04-25: 237 mL via ORAL

## 2017-04-21 MED ORDER — PANTOPRAZOLE SODIUM 40 MG PO TBEC
40.0000 mg | DELAYED_RELEASE_TABLET | Freq: Every day | ORAL | Status: DC
  Administered 2017-04-21 – 2017-04-25 (×4): 40 mg via ORAL
  Filled 2017-04-21 (×4): qty 1

## 2017-04-21 MED ORDER — ASPIRIN 81 MG PO CHEW: 243.0000 mg | CHEWABLE_TABLET | Freq: Every day | ORAL | Status: DC

## 2017-04-21 MED ORDER — FOLIC ACID 1 MG PO TABS
1.0000 mg | ORAL_TABLET | Freq: Every day | ORAL | Status: DC
  Administered 2017-04-21 – 2017-04-25 (×4): 1 mg via ORAL
  Filled 2017-04-21 (×4): qty 1

## 2017-04-21 MED ORDER — DOCUSATE SODIUM 100 MG PO CAPS
100.0000 mg | ORAL_CAPSULE | Freq: Every day | ORAL | Status: DC
  Administered 2017-04-21 – 2017-04-25 (×4): 100 mg via ORAL
  Filled 2017-04-21 (×4): qty 1

## 2017-04-21 MED ORDER — CARVEDILOL 25 MG PO TABS
25.0000 mg | ORAL_TABLET | Freq: Two times a day (BID) | ORAL | Status: DC
  Administered 2017-04-21: 25 mg via ORAL
  Filled 2017-04-21: qty 1

## 2017-04-21 MED ORDER — ONDANSETRON HCL 4 MG/2ML IJ SOLN
4.0000 mg | Freq: Four times a day (QID) | INTRAMUSCULAR | Status: DC | PRN
  Administered 2017-04-21: 4 mg via INTRAVENOUS
  Filled 2017-04-21: qty 2

## 2017-04-21 MED ORDER — ASPIRIN 81 MG PO CHEW
324.0000 mg | CHEWABLE_TABLET | Freq: Every day | ORAL | Status: DC
  Administered 2017-04-21 – 2017-04-24 (×4): 324 mg via ORAL
  Filled 2017-04-21 (×4): qty 4

## 2017-04-21 MED ORDER — MUPIROCIN 2 % EX OINT
1.0000 "application " | TOPICAL_OINTMENT | Freq: Two times a day (BID) | CUTANEOUS | Status: DC
  Administered 2017-04-21 – 2017-04-25 (×8): 1 via NASAL
  Filled 2017-04-21 (×3): qty 22

## 2017-04-21 MED ORDER — PHENYTOIN SODIUM EXTENDED 100 MG PO CAPS
300.0000 mg | ORAL_CAPSULE | Freq: Two times a day (BID) | ORAL | Status: DC
  Administered 2017-04-21: 300 mg via ORAL
  Filled 2017-04-21: qty 3

## 2017-04-21 MED ORDER — FERROUS SULFATE 325 (65 FE) MG PO TABS
325.0000 mg | ORAL_TABLET | Freq: Every day | ORAL | Status: DC
  Administered 2017-04-21 – 2017-04-25 (×4): 325 mg via ORAL
  Filled 2017-04-21 (×4): qty 1

## 2017-04-21 MED ORDER — POLYETHYLENE GLYCOL 3350 17 G PO PACK: 17.0000 g | PACK | Freq: Every day | ORAL | Status: DC | PRN

## 2017-04-21 MED ORDER — CARVEDILOL 12.5 MG PO TABS
12.5000 mg | ORAL_TABLET | Freq: Two times a day (BID) | ORAL | Status: DC
  Administered 2017-04-21 – 2017-04-25 (×9): 12.5 mg via ORAL
  Filled 2017-04-21 (×9): qty 1

## 2017-04-21 MED ORDER — HEPARIN SODIUM (PORCINE) 5000 UNIT/ML IJ SOLN: 5000.0000 [IU] | Freq: Three times a day (TID) | INTRAMUSCULAR | Status: DC

## 2017-04-21 NOTE — Progress Notes (Signed)
Initial Nutrition Assessment  DOCUMENTATION CODES:   Not applicable  INTERVENTION:  Provide Glucerna Shake po BID, each supplement provides 220 kcal and 10 grams of protein.  Encourage adequate PO intake.   NUTRITION DIAGNOSIS:   Increased nutrient needs related to chronic illness as evidenced by estimated needs.  GOAL:   Patient will meet greater than or equal to 90% of their needs  MONITOR:   PO intake, Supplement acceptance, Labs, Weight trends, Skin, I & O's  REASON FOR ASSESSMENT:   Consult Assessment of nutrition requirement/status  ASSESSMENT:   76 y.o. female with medical history significant of anemia, aortic insufficiency, back pain carotid artery stenosis, liver cirrhosis due to NASH, esophageal varices, CAD, lumbar DDD, essential hypertension, GERD/hiatal hernia, history of CVA, hyperlipidemia, obstructive sleep apnea, PSVT, recurring UTIs, type 2 diabetes, who is brought from [facility] in [place], [place] via EMS after a fall affecting the left femur.  Left femur x-ray showed intramedullary rod present with a spiral fracture over the distal portion of the shaft  Plans for surgery Thursday. Meal completion has been 75%. Pt reports appetite is fine currently and PTA with usual consumption of at least 2-3 meals a day with a protein shake on occasion. Pt reports usual body weight of ~145 lbs last weighing 4 months ago with most recent weight of ~135 lbs. Pt with a 6.8% weight loss in 4 months (not significant for time frame). RD to order nutritional supplements to aid in adequate nutrition needs.   Nutrition-Focused physical exam completed. Findings are no fat depletion, moderate muscle depletion, and mild edema. Depletion may be related to the natural aging process.   Labs and medications reviewed.   Diet Order:  Diet heart healthy/carb modified Room service appropriate? Yes; Fluid consistency: Thin  Skin:  Reviewed, no issues  Last BM:  8/13  Height:    Ht Readings from Last 1 Encounters:  03/23/17 5' 3"  (1.6 m)    Weight:   Wt Readings from Last 1 Encounters:  03/23/17 131 lb (59.4 kg)    Ideal Body Weight:  52.27 kg  BMI:  There is no height or weight on file to calculate BMI.  Estimated Nutritional Needs:   Kcal:  1650-1850  Protein:  85-100 grams  Fluid:  1.6 - 1.8 L/day  EDUCATION NEEDS:   No education needs identified at this time  Corrin Parker, MS, RD, LDN Pager # [phone] After hours/ weekend pager # [phone]

## 2017-04-21 NOTE — Progress Notes (Signed)
PROGRESS NOTE   [name]  LGX:[id number]    DOB: 10-Jun-1941    DOA: 04/21/2017  PCP: Redmond School, MD   I have briefly reviewed patients previous medical records in [facility].  Brief Narrative:  76 year old female with PMH of anemia, mild-moderate AS & AI, bilateral carotid stenosis, cirrhosis due to NASH with esophageal varices & banding, CAD status post remote stent, cardiac cath 09/2016 showed only mild atherosclerosis/nonobstructive disease, PSVT, HTN, GERD, stroke, seizures, HLD, OSA, type II DM, s/p left femur IM nail 03/02/17 following which she was undergoing rehabilitation at [facility] in [place] and apparently doing well with ambulation and activities, presented to ED on 04/21/17 after a mechanical fall and sustained a left femur fracture. Orthopedic consulted, unfortunately patient had already eaten breakfast on 8/14 and hence surgery scheduled for 8/16.   Assessment & Plan:   Principal Problem:   Closed left femoral fracture (HCC) Active Problems:   DM type 2 (diabetes mellitus, type 2) (HCC)   OSA (obstructive sleep apnea)   Aortic insufficiency   HTN (hypertension)   Lower urinary tract infectious disease   Seizure disorder ([place])   1. Complex left femur peri-implant fracture: Secondary to mechanical fall at SNF. Orthopedics input appreciated. Based on available data, patient is at moderate risk for perioperative cardiac events but has tolerated recent orthopedic procedure, asymptomatic off exertional dyspnea, chest pain, dizziness, palpitations or syncope and may proceed with indicated surgery without any further cardiac evaluation and with appropriate close perioperative monitoring. Pain control. 2. Diet-controlled DM 2: Continue to monitor CBGs and consider SSI as needed. 3. Anemia: Stable. Follow CBCs. Continue ferrous sulfate. 4. Thrombocytopenia: Stable.? Related to cirrhosis. No bleeding reported. Follow CBCs. 5. Seizure disorder: No recent seizures  reported. Continue Dilantin. 6. OSA: Not on CPAP. 7. Essential hypertension: Controlled. Continue carvedilol. 8. Acute cystitis: Continue IV ceftriaxone pending urine culture results. 9. PSVT: As per outpatient cardiology notes, previous monitor showed infrequent PACs but clinically from notes suspected PSVT that was not captured on monitor. No recent palpitations reported. Continue carvedilol. 10. CAD, history of MI in 1995, status post 2 stents to RCA: Asymptomatic of exertional dyspnea or chest pain. Cardiac cath January 2018 showed nonobstructive CAD. Echo 10/2016: LVEF 65-70 percent. Continue aspirin and carvedilol. 11. Carotid stenosis: Continue aspirin. 12. NASH Cirrhosis complicated by esophageal varices & banding, thrombocytopenia: No bleeding reported. INR in June was 1.28. Continue folate. 13. GERD: PPI.   DVT prophylaxis: Subcutaneous heparin  Code Status: Full Family Communication: None at bedside Disposition: Return to SNF post surgery   Consultants:  Orthopedics   Procedures:  Foley catheter  Antimicrobials:  IV ceftriaxone    Subjective: Seen this morning. She had already finished eating her breakfast. Reports that she was doing quite well after recent orthopedic surgery and ambulating without dyspnea, chest pain. On night of admission, she apparently went to her bathroom and noted her roommates walker in the bathroom and when she was trying to move it out, the walker gave way and patient fell onto her left side hitting her left knee. She states that she was not in much pain and was able to get up and walk to her bed but subsequently noted pain.   ROS: Denies headache, head injury, loss of consciousness, bleeding, seizures, dizziness, lightheadedness, chest pain, dyspnea or palpitations.  Objective:  Vitals:   04/21/17 0445 04/21/17 0519 04/21/17 0816 04/21/17 1558  BP: (!) 117/55 (!) 120/45 (!) 130/40 (!) 132/47  Pulse: 74 76 81  71  Resp: 18 17    Temp:  98.3 F  (36.8 C)    TempSrc:  Oral    SpO2: 97% 96%  97%    Examination:  General exam: Pleasant elderly female, moderately built and nourished, lying comfortably supine in bed. Respiratory system: Clear to auscultation. Respiratory effort normal. Cardiovascular system: S1 & S2 heard, RRR. No JVD, murmurs, rubs, gallops or clicks. No pedal edema. Gastrointestinal system: Abdomen is nondistended, soft and nontender. No organomegaly or masses felt. Normal bowel sounds heard. Central nervous system: Alert and oriented. No focal neurological deficits. Extremities: Symmetric 5 x 5 power except left lower extremity where evaluation is limited due to pain. Swelling, increased warmth and tenderness of distal left thigh and knee. Left lower extremity is shortened and internally rotated at knee. Skin: No rashes, lesions or ulcers Psychiatry: Judgement and insight appear normal. Mood & affect appropriate.     Data Reviewed: I have personally reviewed following labs and imaging studies  CBC:  Recent Labs Lab 04/21/17 0103 04/21/17 0621  WBC 9.9 8.0  NEUTROABS 7.5  --   HGB 10.7* 10.6*  HCT 31.2* 31.8*  MCV 92.3 91.9  PLT 129* 619*   Basic Metabolic Panel:  Recent Labs Lab 04/21/17 0103 04/21/17 0621  NA 134* 135  K 4.3 3.9  CL 104 103  CO2 23 24  GLUCOSE 139* 115*  BUN 15 13  CREATININE 0.53 0.58  CALCIUM 8.5* 8.2*   Liver Function Tests:  Recent Labs Lab 04/21/17 0621  AST 34  ALT 38  ALKPHOS 241*  BILITOT 1.1  PROT 4.6*  ALBUMIN 2.5*   Coagulation Profile: No results for input(s): INR, PROTIME in the last 168 hours. Cardiac Enzymes: No results for input(s): CKTOTAL, CKMB, CKMBINDEX, TROPONINI in the last 168 hours. HbA1C: No results for input(s): HGBA1C in the last 72 hours. CBG:  Recent Labs Lab 04/21/17 0824 04/21/17 1119  GLUCAP 164* 129*    Recent Results (from the past 240 hour(s))  Surgical pcr screen     Status: Abnormal   Collection Time: 04/21/17   8:01 AM  Result Value Ref Range Status   MRSA, PCR POSITIVE (A) NEGATIVE Final    Comment: RESULT CALLED TO, READ BACK BY AND VERIFIED WITH: Michele Rockers RN, AT 1021 04/21/17 BY D. VANHOOK    Staphylococcus aureus POSITIVE (A) NEGATIVE Final    Comment:        The Xpert SA Assay (FDA approved for NASAL specimens in patients over 42 years of age), is one component of a comprehensive surveillance program.  Test performance has been validated by [facility] for patients greater than or equal to 51 year old. It is not intended to diagnose infection nor to guide or monitor treatment.          Radiology Studies: Chest Portable 1 View  Result Date: 04/21/2017 CLINICAL DATA:  Diabetes.  Preoperative chest x-ray. EXAM: PORTABLE CHEST 1 VIEW COMPARISON:  04/21/2017. FINDINGS: Mediastinum hilar structures normal. Heart size normal. No focal infiltrate. Stable mild left base pleural thickening most consistent with scar. Degenerative changes thoracic spine. IMPRESSION: No acute cardiopulmonary disease.  Mild left base pleural scarring. Electronically Signed   By: Marcello Moores  Register   On: 04/21/2017 07:40   Dg Chest Port 1 View  Result Date: 04/21/2017 CLINICAL DATA:  Preoperative chest radiograph for femur fracture. Initial encounter. EXAM: PORTABLE CHEST 1 VIEW COMPARISON:  Chest radiograph performed 02/28/2017 FINDINGS: The lungs are well-aerated and clear. There is  no evidence of focal opacification, pleural effusion or pneumothorax. The cardiomediastinal silhouette is within normal limits. No acute osseous abnormalities are seen. IMPRESSION: No acute cardiopulmonary process seen. No displaced rib fractures identified. Electronically Signed   By: Garald Balding M.D.   On: 04/21/2017 01:48   Dg Hip Port Unilat With Pelvis 1v Left  Result Date: 04/21/2017 CLINICAL DATA:  Preop, femur fracture EXAM: DG HIP (WITH OR WITHOUT PELVIS) 1V PORT LEFT COMPARISON:  04/21/2017, 03/02/2017 FINDINGS: Pubic  symphysis is intact. The pubic rami appear normal. Partially visualized fixating rod in the proximal left femur. Comminuted intertrochanteric fracture with some callus formation noted since the prior radiograph. Vascular calcification. IMPRESSION: Partially visualized left femoral fixating rod across comminuted intertrochanteric fracture with some bony callus noted since the prior study. Electronically Signed   By: Donavan Foil M.D.   On: 04/21/2017 02:26   Dg Femur Min 2 Views Left  Result Date: 04/21/2017 CLINICAL DATA:  [place] and hit leg EXAM: LEFT FEMUR 2 VIEWS COMPARISON:  03/02/2017, 02/28/2017 FINDINGS: Status post internal fixation of remote left proximal femoral fracture. There is an acute fracture involving the distal shaft of the femur around the fixating rod, this demonstrates about 1/4 shaft diameter of medial displacement of the distal fracture fragment and about 1/3 shaft diameter of posterior displacement of distal fracture fragment. The surgical rod is displaced anteriorly and projects over the soft tissues of the distal anterior thigh. Vascular calcifications. Some healing changes are noted about the trochanteric fracture and the alignment here is grossly similar compared to the prior radiographs. IMPRESSION: 1. Status post left femoral rod and screw fixation of a prior left trochanteric fracture. Now seen is an acute displaced fracture involving the distal shaft of the femur. There is anterior displacement of the distal portion of the femoral rod which projects over the anterior soft tissues of the distal thigh/upper knee 2. Proximal femoral fracture grossly similar in alignment with some bony healing changes noted at the fracture site. Electronically Signed   By: Donavan Foil M.D.   On: 04/21/2017 01:09        Scheduled Meds: . aspirin  324 mg Oral QHS  . carvedilol  12.5 mg Oral BID WC  . Chlorhexidine Gluconate Cloth  6 each Topical Q0600  . docusate sodium  100 mg Oral Daily    . feeding supplement (GLUCERNA SHAKE)  237 mL Oral BID WC  . ferrous sulfate  325 mg Oral Q breakfast  . folic acid  1 mg Oral Daily  . heparin  5,000 Units Subcutaneous Q8H  . mupirocin ointment  1 application Nasal BID  . pantoprazole  40 mg Oral Daily  . phenytoin  100 mg Oral BID  . potassium chloride SA  20 mEq Oral Daily   Continuous Infusions: . [START ON 04/22/2017] cefTRIAXone (ROCEPHIN)  IV       LOS: 0 days     Yajaira Doffing, MD, FACP, FHM. [facility] Pager [phone]  If 7PM-7AM, please contact night-coverage [email or web address] Password [facility] 04/21/2017, 4:08 PM

## 2017-04-21 NOTE — ED Provider Notes (Signed)
[facility] DEPT Provider Note   CSN: [id number] Arrival date & time: 04/21/17  0013     History   Chief Complaint Chief Complaint  Patient presents with  . Fall  . Leg Pain    HPI [name] is a 76 y.o. female.  The history is provided by the patient.  She states that she was walking out of her bathroom when the walker gave way and she fell injuring her left leg. Of note, she had surgery on that leg 2 months ago for a broken hip. She is complaining of pain in the left thigh. She was able to get herself up on her bed. Now she notes pain is worse with any movement or palpation. At rest, she rates pain at 5/10.  Past Medical History:  Diagnosis Date  . Anemia   . Aortic insufficiency    Moderate  . Back pain   . Carotid stenosis, bilateral   . Cirrhosis ([place])   . Coronary artery disease    Stent x 2 RCA 1995, cardiac catheterization 09/2016 showing only mild atherosclerosis  . DDD (degenerative disc disease), lumbar   . Essential hypertension   . GERD (gastroesophageal reflux disease)   . Hiatal hernia   . History of stroke   . Hypercholesteremia   . IBS (irritable bowel syndrome)   . Myocardial infarction ([place]) 1995  . Non-alcoholic fatty liver disease   . OSA (obstructive sleep apnea)    Sleep study 2009  AHI 9.91/hr and during REM sleep 35.58/hr  . Pericardial effusion    a. small by echo 2018.  Marland Kitchen PSVT (paroxysmal supraventricular tachycardia) ([place])   . Recurrent UTI   . Type 2 diabetes mellitus ([place])   . Varices, esophageal [facility])     Patient Active Problem List   Diagnosis Date Noted  . Closed pertrochanteric fracture of femur, left, initial encounter ([place]) 03/02/2017  . Seizure ([place]) 02/28/2017  . Hip fracture ([place]) 02/28/2017  . Chest pain 12/07/2016  . Atypical chest pain 12/07/2016  . Tachycardia   . NSTEMI (non-ST elevated myocardial infarction) ([place]) 11/02/2016  . Multifocal atrial tachycardia ([place]) 09/25/2016  . Elevated troponin 09/24/2016    . Hypokalemia 09/23/2016  . TIA (transient ischemic attack) 05/05/2016  . Intractable nausea and vomiting 10/15/2015  . Acute coronary syndrome ([place]) 06/01/2015  . Bronchitis 06/01/2015  . Thrombocytopenia ([place]) 06/01/2015  . Cerebral infarction ([place]) 03/06/2014  . UTI (lower urinary tract infection)   . PSVT (paroxysmal supraventricular tachycardia) ([place]) 12/09/2013  . Esophageal varices ([place]) 03/29/2013  . Hematemesis 03/29/2013  . Hepatic cirrhosis ([place]) 03/29/2013  . Presence of stent in right coronary artery 07/04/2011  . OSA (obstructive sleep apnea) 07/04/2011  . Aortic sclerosis 07/04/2011  . Aortic insufficiency 07/04/2011  . HTN (hypertension) 07/04/2011  . Carotid stenosis, bilateral 07/04/2011  . Chest pain at rest 07/03/2011  . CAD (coronary artery disease) 07/03/2011  . DM type 2 (diabetes mellitus, type 2) ([place]) 07/03/2011  . Hypercholesteremia 07/03/2011  . GERD (gastroesophageal reflux disease) 07/03/2011    Past Surgical History:  Procedure Laterality Date  . [place]  . APPENDECTOMY    . BACK SURGERY  1985  . CARDIAC CATHETERIZATION  [phone]   Stent to the proximal RCA after MI   . CARDIAC CATHETERIZATION N/A 09/26/2016   Procedure: Left Heart Cath and Coronary Angiography;  Surgeon: Leonie Man, MD;  Location: [facility];  Service: Cardiovascular;  Laterality: N/A;  . CATARACT EXTRACTION  W/PHACO Right 07/04/2014   Procedure: CATARACT EXTRACTION PHACO AND INTRAOCULAR LENS PLACEMENT (IOC);  Surgeon: Elta Guadeloupe T. Gershon Crane, MD;  Location: [facility];  Service: Ophthalmology;  Laterality: Right;  CDE:13.13  . COLONOSCOPY    . DILATION AND CURETTAGE OF UTERUS     x2  . Epi Retinal Membrane Peel Left   . ERCP    . ESOPHAGEAL BANDING N/A 04/01/2013   Procedure: ESOPHAGEAL BANDING;  Surgeon: Rogene Houston, MD;  Location: [facility];  Service: Endoscopy;  Laterality: N/A;  . ESOPHAGEAL BANDING N/A 05/24/2013   Procedure: ESOPHAGEAL  BANDING;  Surgeon: Rogene Houston, MD;  [facility];  Service: Endoscopy;  Laterality: N/A;  . ESOPHAGEAL BANDING N/A 06/21/2014   Procedure: ESOPHAGEAL BANDING;  Surgeon: Rogene Houston, MD;  [facility];  Service: Endoscopy;  Laterality: N/A;  . ESOPHAGOGASTRODUODENOSCOPY N/A 04/01/2013   Procedure: ESOPHAGOGASTRODUODENOSCOPY (EGD);  Surgeon: Rogene Houston, MD;  [facility];  Service: Endoscopy;  Laterality: N/A;  230-rescheduled to 8:30am Ann notified pt  . ESOPHAGOGASTRODUODENOSCOPY N/A 05/24/2013   Procedure: ESOPHAGOGASTRODUODENOSCOPY (EGD);  Surgeon: Rogene Houston, MD;  [facility];  Service: Endoscopy;  Laterality: N/A;  730  . ESOPHAGOGASTRODUODENOSCOPY N/A 06/21/2014   Procedure: ESOPHAGOGASTRODUODENOSCOPY (EGD);  Surgeon: Rogene Houston, MD;  [facility];  Service: Endoscopy;  Laterality: N/A;  930-rescheduled 10/14 @ 1200 Ann to notify pt  . EYE SURGERY  08   cataract surgery of the left eye  . FEMUR IM NAIL Left 03/02/2017   Procedure: INTRAMEDULLARY (IM) NAIL FEMORAL;  Surgeon: Rod Can, MD;  Location: [facility];  Service: Orthopedics;  Laterality: Left;  . HARDWARE REMOVAL Right 01/17/2013   Procedure: REMOVAL OF HARDWARE AND EXCISION ULNAR STYLOID RIGHT WRIST;  Surgeon: Tennis Must, MD;  Location: [facility];  Service: Orthopedics;  Laterality: Right;  . MYRINGOTOMY  2012   both ears  . TONSILLECTOMY    . VAGINAL HYSTERECTOMY  1972  . WRIST SURGERY     rt wrist hardwear removal    OB History    No data available       Home Medications    Prior to Admission medications   Medication Sig Start Date End Date Taking? Authorizing Provider  aspirin 81 MG chewable tablet Chew 243 mg by mouth at bedtime.    [provider]  carvedilol (COREG) 12.5 MG tablet Take 1 tablet (12.5 mg total) by mouth 2 (two) times daily. 03/05/17   Bonnielee Haff, MD  docusate sodium (COLACE) 100 MG  capsule Take 100 mg by mouth daily.    [provider]  enoxaparin (LOVENOX) 40 MG/0.4ML injection Inject 0.4 mLs (40 mg total) into the skin daily. 03/05/17   Swinteck, Aaron Edelman, MD  ferrous sulfate 325 (65 FE) MG tablet Take 1 tablet (325 mg total) by mouth 3 (three) times daily after meals. 03/05/17   Bonnielee Haff, MD  folic acid (FOLVITE) 1 MG tablet Take 1 tablet (1 mg total) by mouth daily. 03/06/17   Bonnielee Haff, MD  HYDROcodone-acetaminophen (NORCO/VICODIN) 5-325 MG tablet Take 1-2 tablets by mouth every 6 (six) hours as needed for moderate pain. 03/05/17   Swinteck, Aaron Edelman, MD  ibuprofen (ADVIL) 200 MG tablet Take 200 mg by mouth every 6 (six) hours as needed for headache (pain).     [provider]  methocarbamol (ROBAXIN) 500 MG tablet Take 1 tablet (500 mg total) by mouth every 6 (six) hours  as needed for muscle spasms. 03/05/17   Bonnielee Haff, MD  ondansetron (ZOFRAN) 4 MG tablet Take 4 mg by mouth every 8 (eight) hours as needed for nausea or vomiting.    [provider]  pantoprazole (PROTONIX) 40 MG tablet  03/19/17   [provider]  phenytoin (DILANTIN) 300 MG ER capsule Take 1 capsule (300 mg total) by mouth at bedtime. Patient taking differently: Take 300 mg by mouth 2 (two) times daily.  03/05/17   Bonnielee Haff, MD  polyethylene glycol [facility] / Floria Raveling) packet Take 17 g by mouth daily. 03/06/17   Bonnielee Haff, MD  potassium chloride SA (K-DUR,KLOR-CON) 20 MEQ tablet Take 1 tablet (20 mEq total) by mouth daily. 12/09/16   Lendon Colonel, NP  vitamin B-12 1000 MCG tablet Take 1 tablet (1,000 mcg total) by mouth daily. 03/06/17   Bonnielee Haff, MD  vitamin C (ASCORBIC ACID) 500 MG tablet Take 500 mg by mouth daily.    [provider]    Family History Family History  Problem Relation Age of Onset  . Diabetes Mother   . Heart failure Father   . Heart failure Maternal Aunt     Social History Social History  Substance  Use Topics  . Smoking status: Former Smoker    Packs/day: 0.25    Years: 50.00    Types: Cigarettes    Quit date: 01/13/2012  . Smokeless tobacco: Never Used  . Alcohol use No     Allergies   Tape   Review of Systems Review of Systems  All other systems reviewed and are negative.    Physical Exam Updated Vital Signs BP (!) 101/44 (BP Location: Left Arm)   Pulse 71   Temp 99.5 F (37.5 C) (Oral)   Resp (!) 21   SpO2 96%   Physical Exam  Nursing note and vitals reviewed.  75 year old female, resting comfortably and in no acute distress. Vital signs are normal. Oxygen saturation is 96%, which is normal. Head is normocephalic and atraumatic. PERRLA, EOMI. Oropharynx is clear. Neck is nontender and supple without adenopathy or JVD. Back is nontender and there is no CVA tenderness. Lungs are clear without rales, wheezes, or rhonchi. Chest is nontender. Heart has regular rate and rhythm without murmur. Abdomen is soft, flat, nontender without masses or hepatosplenomegaly and peristalsis is normoactive. Extremities: There is swelling over the distal portion of the left thigh with marked tenderness in that area. There is pain on passive range of motion. Distal neurovascular exam is intact with strong pulses, prompt capillary refill, normal sensation.. Skin is warm and dry without rash. Neurologic: Mental status is normal, cranial nerves are intact, there are no motor or sensory deficits.  ED Treatments / Results  Labs (all labs ordered are listed, but only abnormal results are displayed) Labs Reviewed  CBC WITH DIFFERENTIAL/PLATELET - Abnormal; Notable for the following:       Result Value   RBC 3.38 (*)    Hemoglobin 10.7 (*)    HCT 31.2 (*)    Platelets 129 (*)    All other components within normal limits  BASIC METABOLIC PANEL - Abnormal; Notable for the following:    Sodium 134 (*)    Glucose, Bld 139 (*)    Calcium 8.5 (*)    All other components within normal  limits  URINE CULTURE  URINALYSIS, ROUTINE W REFLEX MICROSCOPIC  PHENYTOIN LEVEL, TOTAL  TYPE AND SCREEN    EKG  EKG Interpretation  Date/Time:  Tuesday April 21 2017 01:08:57 EDT Ventricular Rate:  72 PR Interval:    QRS Duration: 94 QT Interval:  430 QTC Calculation: 471 R Axis:   -25 Text Interpretation:  Sinus rhythm Borderline left axis deviation Probable anteroseptal infarct, old When compared with ECG of 02/28/2017, No significant change was found Confirmed by Delora Fuel ([id number]) on 04/21/2017 1:17:02 AM       Radiology Dg Chest Port 1 View  Result Date: 04/21/2017 CLINICAL DATA:  Preoperative chest radiograph for femur fracture. Initial encounter. EXAM: PORTABLE CHEST 1 VIEW COMPARISON:  Chest radiograph performed 02/28/2017 FINDINGS: The lungs are well-aerated and clear. There is no evidence of focal opacification, pleural effusion or pneumothorax. The cardiomediastinal silhouette is within normal limits. No acute osseous abnormalities are seen. IMPRESSION: No acute cardiopulmonary process seen. No displaced rib fractures identified. Electronically Signed   By: Garald Balding M.D.   On: 04/21/2017 01:48   Dg Femur Min 2 Views Left  Result Date: 04/21/2017 CLINICAL DATA:  [place] and hit leg EXAM: LEFT FEMUR 2 VIEWS COMPARISON:  03/02/2017, 02/28/2017 FINDINGS: Status post internal fixation of remote left proximal femoral fracture. There is an acute fracture involving the distal shaft of the femur around the fixating rod, this demonstrates about 1/4 shaft diameter of medial displacement of the distal fracture fragment and about 1/3 shaft diameter of posterior displacement of distal fracture fragment. The surgical rod is displaced anteriorly and projects over the soft tissues of the distal anterior thigh. Vascular calcifications. Some healing changes are noted about the trochanteric fracture and the alignment here is grossly similar compared to the prior radiographs. IMPRESSION: 1.  Status post left femoral rod and screw fixation of a prior left trochanteric fracture. Now seen is an acute displaced fracture involving the distal shaft of the femur. There is anterior displacement of the distal portion of the femoral rod which projects over the anterior soft tissues of the distal thigh/upper knee 2. Proximal femoral fracture grossly similar in alignment with some bony healing changes noted at the fracture site. Electronically Signed   By: Donavan Foil M.D.   On: 04/21/2017 01:09    Procedures Procedures (including critical care time)  Medications Ordered in ED Medications - No data to display   Initial Impression / Assessment and Plan / ED Course  I have reviewed the triage vital signs and the nursing notes.  Pertinent labs & imaging results that were available during my care of the patient were reviewed by me and considered in my medical decision making (see chart for details).  Fall with injury to left thigh. X-rays show an intramedullary rod present in the left femur with a spiral fracture over the distal portion of the shaft. Review of old records confirms hospitalization with hip fracture treated with intramedullary nail on June 25.  Cases discussed with Dr. Stann Mainland of orthopedics who requests the patient be admitted to the hospitalist service, and transferred to [facility]. Case is discussed with Dr. Olevia Bowens of [facility] hospitalists who agrees to admit the patient.  Final Clinical Impressions(s) / ED Diagnoses   Final diagnoses:  Fall in home, initial encounter  Closed fracture of distal end of left femur, initial encounter ([place])  Normochromic normocytic anemia  Thrombocytopenia ([place])    New Prescriptions New Prescriptions   No medications on file     Delora Fuel, MD 17/49/44 0151

## 2017-04-21 NOTE — ED Notes (Signed)
Patient transported to X-ray 

## 2017-04-21 NOTE — Progress Notes (Signed)
[facility]   Case discussed with Dr. Stann Mainland OTS will accept pt for fixation of her complex L femur peri-implant fractue I had talked Dr. Stann Mainland around 0700 this am  He instructed pt not to eat until he called back into the room with updated disposition  We reviewed films and were able to fit the pt in the OR schedule today  Unfortunately it was noted the pt had an active diet order when I was reviewing the chart I called RN who indicated that the pt did in fact eat a solid regular diet this am   Pt will now be scheduled for Thursday (we have clinic all day on Wednesday)  Will complete formal consult later this am  Jari Pigg, PA-C [facility] [phone] (C) [phone] (O) 04/21/2017 8:41 AM

## 2017-04-21 NOTE — Consult Note (Signed)
ORTHOPAEDIC CONSULTATION  REQUESTING PHYSICIAN: Modena Jansky, MD  PCP:  Redmond School, MD  Chief Complaint: Left thigh pain   HPI: [name] is a 76 y.o. female who complains of acute onset of increasing left distal thigh pain. She states she was in her normal state of health at home using an anterior walker she felt a crunching sensation and acute increase in pain of the distal femur left leg. Of note she is about 2 months status post left hipintramedullary nail for intertrochanteric hip fracture about my partner Dr. [name]. She states she was doing very well up until this episode yesterday. She was using an anterior walker for mobility. She had regained independence with her ADLs.  She was Evaluated at [facility] and found to have a distal femoral shaft periprosthetic fracture around the long nail.  She has since been transferred to [facility] for definitive orthopedic management. Currently she describes her pain level as being mild and not having much discomfort at all. She denies any distal paresthesias numbness or otherwise. Denies any dizziness or syncopal type episodes or chest pain or shortness of breath prior to the fall.  Past Medical History:  Diagnosis Date  . Anemia   . Aortic insufficiency    Moderate  . Back pain   . Carotid stenosis, bilateral   . Cirrhosis ([place])   . Coronary artery disease    Stent x 2 RCA 1995, cardiac catheterization 09/2016 showing only mild atherosclerosis  . DDD (degenerative disc disease), lumbar   . Essential hypertension   . GERD (gastroesophageal reflux disease)   . Hiatal hernia   . History of stroke   . Hypercholesteremia   . IBS (irritable bowel syndrome)   . Myocardial infarction ([place]) 1995  . Non-alcoholic fatty liver disease   . OSA (obstructive sleep apnea)    Sleep study 2009  AHI 9.91/hr and during REM sleep 35.58/hr  . Pericardial effusion    a. small by echo 2018.  Marland Kitchen PSVT (paroxysmal supraventricular  tachycardia) ([place])   . Recurrent UTI   . Type 2 diabetes mellitus ([place])   . Varices, esophageal ([place])    Past Surgical History:  Procedure Laterality Date  . [place]  . APPENDECTOMY    . BACK SURGERY  1985  . CARDIAC CATHETERIZATION  [phone]   Stent to the proximal RCA after MI   . CARDIAC CATHETERIZATION N/A 09/26/2016   Procedure: Left Heart Cath and Coronary Angiography;  Surgeon: Leonie Man, MD;  Location: [place] [facility];  Service: Cardiovascular;  Laterality: N/A;  . CATARACT EXTRACTION W/PHACO Right 07/04/2014   Procedure: CATARACT EXTRACTION PHACO AND INTRAOCULAR LENS PLACEMENT (IOC);  Surgeon: Elta Guadeloupe T. Gershon Crane, MD;  Location: [facility];  Service: Ophthalmology;  Laterality: Right;  CDE:13.13  . COLONOSCOPY    . DILATION AND CURETTAGE OF UTERUS     x2  . Epi Retinal Membrane Peel Left   . ERCP    . ESOPHAGEAL BANDING N/A 04/01/2013   Procedure: ESOPHAGEAL BANDING;  Surgeon: Rogene Houston, MD;  Location: [facility];  Service: Endoscopy;  Laterality: N/A;  . ESOPHAGEAL BANDING N/A 05/24/2013   Procedure: ESOPHAGEAL BANDING;  Surgeon: Rogene Houston, MD;  Location: [facility];  Service: Endoscopy;  Laterality: N/A;  . ESOPHAGEAL BANDING N/A 06/21/2014   Procedure: ESOPHAGEAL BANDING;  Surgeon: Rogene Houston, MD;  [facility];  Service: Endoscopy;  Laterality: N/A;  . ESOPHAGOGASTRODUODENOSCOPY N/A  04/01/2013   Procedure: ESOPHAGOGASTRODUODENOSCOPY (EGD);  Surgeon: Rogene Houston, MD;  [facility];  Service: Endoscopy;  Laterality: N/A;  230-rescheduled to 8:30am Ann notified pt  . ESOPHAGOGASTRODUODENOSCOPY N/A 05/24/2013   Procedure: ESOPHAGOGASTRODUODENOSCOPY (EGD);  Surgeon: Rogene Houston, MD;  [facility];  Service: Endoscopy;  Laterality: N/A;  730  . ESOPHAGOGASTRODUODENOSCOPY N/A 06/21/2014   Procedure: ESOPHAGOGASTRODUODENOSCOPY (EGD);  Surgeon: Rogene Houston, MD;  [facility];   Service: Endoscopy;  Laterality: N/A;  930-rescheduled 10/14 @ 1200 Ann to notify pt  . EYE SURGERY  08   cataract surgery of the left eye  . FEMUR IM NAIL Left 03/02/2017   Procedure: INTRAMEDULLARY (IM) NAIL FEMORAL;  Surgeon: Rod Can, MD;  Location: [place];  Service: Orthopedics;  Laterality: Left;  . HARDWARE REMOVAL Right 01/17/2013   Procedure: REMOVAL OF HARDWARE AND EXCISION ULNAR STYLOID RIGHT WRIST;  Surgeon: Tennis Must, MD;  Location: [place];  Service: Orthopedics;  Laterality: Right;  . MYRINGOTOMY  2012   both ears  . TONSILLECTOMY    . VAGINAL HYSTERECTOMY  1972  . WRIST SURGERY     rt wrist hardwear removal   Social History   Social History  . Marital status: Widowed    Spouse name: N/A  . Number of children: 2  . Years of education: N/A   Occupational History  .  Retired   Social History Main Topics  . Smoking status: Former Smoker    Packs/day: 0.25    Years: 50.00    Types: Cigarettes    Quit date: 01/13/2012  . Smokeless tobacco: Never Used  . Alcohol use No  . Drug use: No  . Sexual activity: Yes    Birth control/ protection: Surgical   Other Topics Concern  . None   Social History Narrative  . None   Family History  Problem Relation Age of Onset  . Diabetes Mother   . Heart failure Father   . Heart failure Maternal Aunt    Allergies  Allergen Reactions  . Tape Rash    Please use paper tape   Prior to Admission medications   Medication Sig Start Date End Date Taking? Authorizing Provider  carvedilol (COREG) 25 MG tablet Take 25 mg by mouth 2 (two) times daily with a meal.   Yes [provider]  aspirin 81 MG chewable tablet Chew 243 mg by mouth at bedtime.    [provider]  docusate sodium (COLACE) 100 MG capsule Take 100 mg by mouth daily.    [provider]  ferrous sulfate 325 (65 FE) MG tablet Take 1 tablet (325 mg total) by mouth 3 (three) times daily after meals. Patient taking  differently: Take 325 mg by mouth daily with breakfast.  03/05/17   Bonnielee Haff, MD  folic acid (FOLVITE) 1 MG tablet Take 1 tablet (1 mg total) by mouth daily. 03/06/17   Bonnielee Haff, MD  ibuprofen (ADVIL) 200 MG tablet Take 200 mg by mouth every 6 (six) hours as needed for headache (pain).     [provider]  ondansetron (ZOFRAN) 4 MG tablet Take 4 mg by mouth every 8 (eight) hours as needed for nausea or vomiting.    [provider]  pantoprazole (PROTONIX) 40 MG tablet  03/19/17   [provider]  phenytoin (DILANTIN) 300 MG ER capsule Take 1 capsule (300 mg total) by mouth at bedtime. Patient taking differently: Take 300 mg  by mouth 2 (two) times daily.  03/05/17   Bonnielee Haff, MD  polyethylene glycol [facility] / Floria Raveling) packet Take 17 g by mouth daily. 03/06/17   Bonnielee Haff, MD  potassium chloride SA (K-DUR,KLOR-CON) 20 MEQ tablet Take 1 tablet (20 mEq total) by mouth daily. 12/09/16   Lendon Colonel, NP  vitamin B-12 1000 MCG tablet Take 1 tablet (1,000 mcg total) by mouth daily. 03/06/17   Bonnielee Haff, MD  vitamin C (ASCORBIC ACID) 500 MG tablet Take 500 mg by mouth daily.    [provider]   Dg Chest Port 1 View  Result Date: 04/21/2017 CLINICAL DATA:  Preoperative chest radiograph for femur fracture. Initial encounter. EXAM: PORTABLE CHEST 1 VIEW COMPARISON:  Chest radiograph performed 02/28/2017 FINDINGS: The lungs are well-aerated and clear. There is no evidence of focal opacification, pleural effusion or pneumothorax. The cardiomediastinal silhouette is within normal limits. No acute osseous abnormalities are seen. IMPRESSION: No acute cardiopulmonary process seen. No displaced rib fractures identified. Electronically Signed   By: Garald Balding M.D.   On: 04/21/2017 01:48   Dg Hip Port Unilat With Pelvis 1v Left  Result Date: 04/21/2017 CLINICAL DATA:  Preop, femur fracture EXAM: DG HIP (WITH OR WITHOUT PELVIS) 1V PORT LEFT  COMPARISON:  04/21/2017, 03/02/2017 FINDINGS: Pubic symphysis is intact. The pubic rami appear normal. Partially visualized fixating rod in the proximal left femur. Comminuted intertrochanteric fracture with some callus formation noted since the prior radiograph. Vascular calcification. IMPRESSION: Partially visualized left femoral fixating rod across comminuted intertrochanteric fracture with some bony callus noted since the prior study. Electronically Signed   By: Donavan Foil M.D.   On: 04/21/2017 02:26   Dg Femur Min 2 Views Left  Result Date: 04/21/2017 CLINICAL DATA:  [place] and hit leg EXAM: LEFT FEMUR 2 VIEWS COMPARISON:  03/02/2017, 02/28/2017 FINDINGS: Status post internal fixation of remote left proximal femoral fracture. There is an acute fracture involving the distal shaft of the femur around the fixating rod, this demonstrates about 1/4 shaft diameter of medial displacement of the distal fracture fragment and about 1/3 shaft diameter of posterior displacement of distal fracture fragment. The surgical rod is displaced anteriorly and projects over the soft tissues of the distal anterior thigh. Vascular calcifications. Some healing changes are noted about the trochanteric fracture and the alignment here is grossly similar compared to the prior radiographs. IMPRESSION: 1. Status post left femoral rod and screw fixation of a prior left trochanteric fracture. Now seen is an acute displaced fracture involving the distal shaft of the femur. There is anterior displacement of the distal portion of the femoral rod which projects over the anterior soft tissues of the distal thigh/upper knee 2. Proximal femoral fracture grossly similar in alignment with some bony healing changes noted at the fracture site. Electronically Signed   By: Donavan Foil M.D.   On: 04/21/2017 01:09    Positive ROS: All other systems have been reviewed and were otherwise negative with the exception of those mentioned in the HPI and  as above.  Physical Exam: General: Alert, no acute distress Cardiovascular: No pedal edema Respiratory: No cyanosis, no use of accessory musculature GI: No organomegaly, abdomen is soft and non-tender Skin: No lesions in the area of chief complaint Neurologic: Sensation intact distally Psychiatric: Patient is competent for consent with normal mood and affect Lymphatic: No axillary or cervical lymphadenopathy  MUSCULOSKELETAL:   Left lower extremity demonstrates fullness in the distal thigh with no open wounds or abrasions  of the skin. She has tenderness to palpation along the distal thigh. distally at the foot and ankle she has a varus deformity of the ankle and foot with motor intact on testing of tibialis anterior/gastrocsoleus strength/flexor hallucis longus/extensor hallucis longus. Sensation intact to light touch in the deep and superficial peroneal nerve as well as the sural and saphenous and tibial nerves. Dorsalis dorsalis pedis pulse. Calf is soft and nontender. No pain with passive stretch.  Assessment: Left femur spiral periprosthetic fracture closed.  Plan: - his injury pattern will need operative fixation. We will keep her nonweightbearing on the left lower extremity at this time. On discussing this case with my partner Dr. [name] is involved in her index procedure.  He may ultimately require the care of an orthopedic traumatologist. We will update the primary team as soon as we have that information and surgical plan in place. - Please keep nothing by mouth for now.   Nicholes Stairs, MD Cell [phone]    04/21/2017 7:24 AM

## 2017-04-21 NOTE — Progress Notes (Signed)
I have discussed the case with Dr. Roxanne Mins of the ED and the left femur will need operative management.  Appreciate medicine admission, and will update with full consult note to follow after transfer to [facility] hospital.

## 2017-04-21 NOTE — ED Notes (Signed)
[facility] at bedside for transport. 

## 2017-04-21 NOTE — ED Triage Notes (Signed)
Pt comes from [facility] out of [place] via EMS after a fall affecting the left femur.  Shortening and some rotation noted by EMS.  Pt A&O x4. Vitals WNL.

## 2017-04-21 NOTE — H&P (Signed)
History and Physical    [name] WRU:[id number] DOB: 02-Oct-1940 DOA: 04/21/2017  PCP: Redmond School, MD   Patient coming from: Home.  I have personally briefly reviewed patient's old medical records in [place]  Chief Complaint: Fall.  HPI: [name] is a 76 y.o. female with medical history significant of anemia, aortic insufficiency, back pain carotid artery stenosis, liver cirrhosis due to NASH, esophageal varices, CAD, lumbar DDD, essential hypertension, GERD/hiatal hernia, history of CVA, hyperlipidemia, obstructive sleep apnea, PSVT, recurring UTIs, type 2 diabetes, who is brought from [facility] in [place], [place] via EMS after a fall affecting the left femur.   Per patient, she stated that she was walking out of the bathroom when her walker gave away, causing her to fall on her left side and injuring her left lower extremities. She had surgery on her left hip about 2 months ago due to a fall. She denies headache, dizziness, dyspnea, chest pain, palpitations, diaphoresis, abdominal pain, nausea, emesis, diarrhea or constipation. She complains of frequency, but denies dysuria or hematuria. She denies polyuria, polydipsia or blurred vision.  ED Course: Initial vital signs temperature 99.70F, pulse 70, blood pressure 101/44 mmHg, respirations 21 and O2 sat 96% on room air. Urinalysis showed bacteriuria and pyuria. WBC was 9.9, hemoglobin 10.7 g/dL and platelets 129. Her BMP showed a sodium of 134 and a glucose of 139, but he was otherwise within normal limits. Phenytoin level was 10.6 mcg/mL.  Imaging: Left femur x-ray showed intramedullary rod present with a spiral fracture over the distal portion of the shaft. Chest radiograph did not show any acute cardiopulmonary pathology. Please see images and full radiology report for further detail.  Review of Systems: As per HPI otherwise 10 point review of systems negative.    Past Medical History:  Diagnosis Date  .  Anemia   . Aortic insufficiency    Moderate  . Back pain   . Carotid stenosis, bilateral   . Cirrhosis ([place])   . Coronary artery disease    Stent x 2 RCA 1995, cardiac catheterization 09/2016 showing only mild atherosclerosis  . DDD (degenerative disc disease), lumbar   . Essential hypertension   . GERD (gastroesophageal reflux disease)   . Hiatal hernia   . History of stroke   . Hypercholesteremia   . IBS (irritable bowel syndrome)   . Myocardial infarction ([place]) 1995  . Non-alcoholic fatty liver disease   . OSA (obstructive sleep apnea)    Sleep study 2009  AHI 9.91/hr and during REM sleep 35.58/hr  . Pericardial effusion    a. small by echo 2018.  Marland Kitchen PSVT (paroxysmal supraventricular tachycardia) ([place])   . Recurrent UTI   . Type 2 diabetes mellitus ([place])   . Varices, esophageal ([place])     Past Surgical History:  Procedure Laterality Date  . [place]  . APPENDECTOMY    . BACK SURGERY  1985  . CARDIAC CATHETERIZATION  [phone]   Stent to the proximal RCA after MI   . CARDIAC CATHETERIZATION N/A 09/26/2016   Procedure: Left Heart Cath and Coronary Angiography;  Surgeon: Leonie Man, MD;  Location: [facility];  Service: Cardiovascular;  Laterality: N/A;  . CATARACT EXTRACTION W/PHACO Right 07/04/2014   Procedure: CATARACT EXTRACTION PHACO AND INTRAOCULAR LENS PLACEMENT (IOC);  Surgeon: Elta Guadeloupe T. Gershon Crane, MD;  Location: [facility];  Service: Ophthalmology;  Laterality: Right;  CDE:13.13  . COLONOSCOPY    . DILATION AND CURETTAGE OF  UTERUS     x2  . Epi Retinal Membrane Peel Left   . ERCP    . ESOPHAGEAL BANDING N/A 04/01/2013   Procedure: ESOPHAGEAL BANDING;  Surgeon: Rogene Houston, MD;  Location[facility];  Service: Endoscopy;  Laterality: N/A;  . ESOPHAGEAL BANDING N/A 05/24/2013   Procedure: ESOPHAGEAL BANDING;  Surgeon: Rogene Houston, MD;  [facility];  Service: Endoscopy;  Laterality: N/A;  . ESOPHAGEAL BANDING N/A  06/21/2014   Procedure: ESOPHAGEAL BANDING;  Surgeon: Rogene Houston, MD;  [facility];  Service: Endoscopy;  Laterality: N/A;  . ESOPHAGOGASTRODUODENOSCOPY N/A 04/01/2013   Procedure: ESOPHAGOGASTRODUODENOSCOPY (EGD);  Surgeon: Rogene Houston, MD;  [facility];  Service: Endoscopy;  Laterality: N/A;  230-rescheduled to 8:30am Ann notified pt  . ESOPHAGOGASTRODUODENOSCOPY N/A 05/24/2013   Procedure: ESOPHAGOGASTRODUODENOSCOPY (EGD);  Surgeon: Rogene Houston, MD;  [facility];  Service: Endoscopy;  Laterality: N/A;  730  . ESOPHAGOGASTRODUODENOSCOPY N/A 06/21/2014   Procedure: ESOPHAGOGASTRODUODENOSCOPY (EGD);  Surgeon: Rogene Houston, MD;  [facility];  Service: Endoscopy;  Laterality: N/A;  930-rescheduled 10/14 @ 1200 Ann to notify pt  . EYE SURGERY  08   cataract surgery of the left eye  . FEMUR IM NAIL Left 03/02/2017   Procedure: INTRAMEDULLARY (IM) NAIL FEMORAL;  Surgeon: Rod Can, MD;  Location: [facility];  Service: Orthopedics;  Laterality: Left;  . HARDWARE REMOVAL Right 01/17/2013   Procedure: REMOVAL OF HARDWARE AND EXCISION ULNAR STYLOID RIGHT WRIST;  Surgeon: Tennis Must, MD;  Location: [facility];  Service: Orthopedics;  Laterality: Right;  . MYRINGOTOMY  2012   both ears  . TONSILLECTOMY    . VAGINAL HYSTERECTOMY  1972  . WRIST SURGERY     rt wrist hardwear removal     reports that she quit smoking about 5 years ago. Her smoking use included Cigarettes. She has a 12.50 pack-year smoking history. She has never used smokeless tobacco. She reports that she does not drink alcohol or use drugs.  Allergies  Allergen Reactions  . Tape Rash    Please use paper tape    Family History  Problem Relation Age of Onset  . Diabetes Mother   . Heart failure Father   . Heart failure Maternal Aunt     Prior to Admission medications   Medication Sig Start Date End Date Taking? Authorizing Provider  carvedilol  (COREG) 25 MG tablet Take 25 mg by mouth 2 (two) times daily with a meal.   Yes [provider]  aspirin 81 MG chewable tablet Chew 243 mg by mouth at bedtime.    [provider]  docusate sodium (COLACE) 100 MG capsule Take 100 mg by mouth daily.    [provider]  ferrous sulfate 325 (65 FE) MG tablet Take 1 tablet (325 mg total) by mouth 3 (three) times daily after meals. Patient taking differently: Take 325 mg by mouth daily with breakfast.  03/05/17   Bonnielee Haff, MD  folic acid (FOLVITE) 1 MG tablet Take 1 tablet (1 mg total) by mouth daily. 03/06/17   Bonnielee Haff, MD  ibuprofen (ADVIL) 200 MG tablet Take 200 mg by mouth every 6 (six) hours as needed for headache (pain).     [provider]  ondansetron (ZOFRAN) 4 MG tablet Take 4 mg by mouth every 8 (eight) hours as needed for nausea or vomiting.    [provider]  pantoprazole (  PROTONIX) 40 MG tablet  03/19/17   [provider]  phenytoin (DILANTIN) 300 MG ER capsule Take 1 capsule (300 mg total) by mouth at bedtime. Patient taking differently: Take 300 mg by mouth 2 (two) times daily.  03/05/17   Bonnielee Haff, MD  polyethylene glycol [facility] / Floria Raveling) packet Take 17 g by mouth daily. 03/06/17   Bonnielee Haff, MD  potassium chloride SA (K-DUR,KLOR-CON) 20 MEQ tablet Take 1 tablet (20 mEq total) by mouth daily. 12/09/16   Lendon Colonel, NP  vitamin B-12 1000 MCG tablet Take 1 tablet (1,000 mcg total) by mouth daily. 03/06/17   Bonnielee Haff, MD  vitamin C (ASCORBIC ACID) 500 MG tablet Take 500 mg by mouth daily.    [provider]    Physical Exam: Vitals:   04/21/17 0130 04/21/17 0145 04/21/17 0200 04/21/17 0215  BP: (!) 105/55  102/69   Pulse: 71 75 73 74  Resp: _0 Temp:      TempSrc:      SpO2: 97% 97% 96% 98%    Constitutional: NAD, calm, comfortable Eyes: PERRL, lids and conjunctivae normal ENMT: Mucous membranes are moist. Posterior  pharynx clear of any exudate or lesions. Neck: normal, supple, no masses, no thyromegaly Respiratory: Decreased breath sounds on bases, otherwise clear to auscultation bilaterally, no wheezing, no crackles. Normal respiratory effort. No accessory muscle use.  Cardiovascular: Regular rate and rhythm, no murmurs / rubs / gallops. No extremity edema. 2+ pedal pulses. No carotid bruits.  Abdomen: Soft, no tenderness, no masses palpated. No hepatosplenomegaly. Bowel sounds positive.  Musculoskeletal:  no clubbing / cyanosis. Shorttened and internally rotated left lower extremity with good distal pulses and capillary refill.Marland Kitchen LLE severely decreased ROM, no contractures. Normal muscle tone.  Skin: no rashes, lesions, ulcers on limited skin exam. Neurologic: CN 2-12 grossly intact. Sensation intact, DTR normal. Strength 5/5 in all 4.  Psychiatric: Normal judgment and insight. Alert and oriented x 4. Normal mood.    Labs on Admission: I have personally reviewed following labs and imaging studies  CBC:  Recent Labs Lab 04/21/17 0103  WBC 9.9  NEUTROABS 7.5  HGB 10.7*  HCT 31.2*  MCV 92.3  PLT 903*   Basic Metabolic Panel:  Recent Labs Lab 04/21/17 0103  NA 134*  K 4.3  CL 104  CO2 23  GLUCOSE 139*  BUN 15  CREATININE 0.53  CALCIUM 8.5*   GFR: CrCl cannot be calculated (Unknown ideal weight.). Liver Function Tests: No results for input(s): AST, ALT, ALKPHOS, BILITOT, PROT, ALBUMIN in the last 168 hours. No results for input(s): LIPASE, AMYLASE in the last 168 hours. No results for input(s): AMMONIA in the last 168 hours. Coagulation Profile: No results for input(s): INR, PROTIME in the last 168 hours. Cardiac Enzymes: No results for input(s): CKTOTAL, CKMB, CKMBINDEX, TROPONINI in the last 168 hours. BNP (last 3 results) No results for input(s): PROBNP in the last 8760 hours. HbA1C: No results for input(s): HGBA1C in the last 72 hours. CBG: No results for input(s): GLUCAP  in the last 168 hours. Lipid Profile: No results for input(s): CHOL, HDL, LDLCALC, TRIG, CHOLHDL, LDLDIRECT in the last 72 hours. Thyroid Function Tests: No results for input(s): TSH, T4TOTAL, FREET4, T3FREE, THYROIDAB in the last 72 hours. Anemia Panel: No results for input(s): VITAMINB12, FOLATE, FERRITIN, TIBC, IRON, RETICCTPCT in the last 72 hours. Urine analysis:    Component Value Date/Time   COLORURINE AMBER (A) 04/21/2017 0200   APPEARANCEUR  HAZY (A) 04/21/2017 0200   LABSPEC 1.027 04/21/2017 0200   PHURINE 5.0 04/21/2017 0200   GLUCOSEU NEGATIVE 04/21/2017 0200   HGBUR NEGATIVE 04/21/2017 0200   BILIRUBINUR SMALL (A) 04/21/2017 0200   KETONESUR NEGATIVE 04/21/2017 0200   PROTEINUR NEGATIVE 04/21/2017 0200   UROBILINOGEN 0.2 07/09/2015 0930   NITRITE POSITIVE (A) 04/21/2017 0200   LEUKOCYTESUR LARGE (A) 04/21/2017 0200    Radiological Exams on Admission: Dg Chest Port 1 View  Result Date: 04/21/2017 CLINICAL DATA:  Preoperative chest radiograph for femur fracture. Initial encounter. EXAM: PORTABLE CHEST 1 VIEW COMPARISON:  Chest radiograph performed 02/28/2017 FINDINGS: The lungs are well-aerated and clear. There is no evidence of focal opacification, pleural effusion or pneumothorax. The cardiomediastinal silhouette is within normal limits. No acute osseous abnormalities are seen. IMPRESSION: No acute cardiopulmonary process seen. No displaced rib fractures identified. Electronically Signed   By: Garald Balding M.D.   On: 04/21/2017 01:48   Dg Hip Port Unilat With Pelvis 1v Left  Result Date: 04/21/2017 CLINICAL DATA:  Preop, femur fracture EXAM: DG HIP (WITH OR WITHOUT PELVIS) 1V PORT LEFT COMPARISON:  04/21/2017, 03/02/2017 FINDINGS: Pubic symphysis is intact. The pubic rami appear normal. Partially visualized fixating rod in the proximal left femur. Comminuted intertrochanteric fracture with some callus formation noted since the prior radiograph. Vascular calcification.  IMPRESSION: Partially visualized left femoral fixating rod across comminuted intertrochanteric fracture with some bony callus noted since the prior study. Electronically Signed   By: Donavan Foil M.D.   On: 04/21/2017 02:26   Dg Femur Min 2 Views Left  Result Date: 04/21/2017 CLINICAL DATA:  [place] and hit leg EXAM: LEFT FEMUR 2 VIEWS COMPARISON:  03/02/2017, 02/28/2017 FINDINGS: Status post internal fixation of remote left proximal femoral fracture. There is an acute fracture involving the distal shaft of the femur around the fixating rod, this demonstrates about 1/4 shaft diameter of medial displacement of the distal fracture fragment and about 1/3 shaft diameter of posterior displacement of distal fracture fragment. The surgical rod is displaced anteriorly and projects over the soft tissues of the distal anterior thigh. Vascular calcifications. Some healing changes are noted about the trochanteric fracture and the alignment here is grossly similar compared to the prior radiographs. IMPRESSION: 1. Status post left femoral rod and screw fixation of a prior left trochanteric fracture. Now seen is an acute displaced fracture involving the distal shaft of the femur. There is anterior displacement of the distal portion of the femoral rod which projects over the anterior soft tissues of the distal thigh/upper knee 2. Proximal femoral fracture grossly similar in alignment with some bony healing changes noted at the fracture site. Electronically Signed   By: Donavan Foil M.D.   On: 04/21/2017 01:09   03/03/2017 echocardiogram completely  ------------------------------------------------------------------- LV EF: 65% -   70%  ------------------------------------------------------------------- History:   PMH:  Aortic Stenosis. Aortic Insufficiency.  Coronary artery disease.  Stroke.  PMH:   Myocardial infarction.  Risk factors:  Hypertension. Diabetes mellitus.  Dyslipidemia.  ------------------------------------------------------------------- Study Conclusions  - Left ventricle: The cavity size was normal. Wall thickness was   increased in a pattern of mild LVH. Systolic function was   vigorous. The estimated ejection fraction was in the range of 65%   to 70%. Wall motion was normal; there were no regional wall   motion abnormalities. The study is not technically sufficient to   allow evaluation of LV diastolic function. - Aortic valve: Calcified with restricted leaflet motion. There is  moderate stenosis and mild to moderate AI. Mean gradient (S): 17   mm Hg. Peak gradient (S): 32 mm Hg. Valve area (VTI): 1.48 cm^2.   Valve area (Vmax): 1.45 cm^2. Valve area (Vmean): 1.4 cm^2. - Mitral valve: Heavy MAC - there is mild stenosis. Trivial   regurgitation. Mean gradient (D): 6 mm Hg. - Left atrium: Severely dilated. - Tricuspid valve: There was trivial regurgitation. - Pulmonary arteries: PA peak pressure: 18 mm Hg (S). - Inferior vena cava: The vessel was normal in size. The   respirophasic diameter changes were in the normal range (>= 50%),   consistent with normal central venous pressure.  Impressions:  - Compared to a prior study in 10/2016, the LVEF is unchanged. There   is mild to moderate aortic stenosis with mild to moderate AI,   there is heavy MAC with mild MS, the LA is severely dilated.  EKG: Independently reviewed. Vent. rate 72 BPM PR interval * ms QRS duration 94 ms QT/QTc 430/471 ms P-R-T axes -2 -25 58 Sinus rhythm Borderline left axis deviation Probable anteroseptal infarct, old  Assessment/Plan Principal Problem:   Closed left femoral fracture (HCC) Admit to [facility]/inpatient. Continue analgesics as needed. Monitor hematocrit and hemoglobin. Orthopedic surgery will evaluate later in the morning.  Active Problems:   DM type 2 (diabetes mellitus, type 2) (HCC) Carbohydrate modified diet. CBG monitoring with  regular insulin sliding scale while in the hospital.    Seizure disorder ([place]) Continue phenytoin. Monitor level as needed.    OSA (obstructive sleep apnea) Per patient, she does not use CPAP.    Aortic insufficiency Stable. Monitor intake and output. Continue carvedilol.    HTN (hypertension) Continue carvedilol 25 mg by mouth twice a day. Monitor blood pressure.    Lower urinary tract infectious disease Complaints of frequency, but no dysuria or hematuria. Since the patient is going to have surgery, will start ceftriaxone 1 g IVPB every 24 hours. Follow-up urine culture and sensitivity.    DVT prophylaxis: Heparin SQ. Code Status: Full code. Family Communication:  Disposition Plan: Admit to [facility] for orthopedic surgery evaluation and intervention. Consults called: Dr. Stann Mainland (orthopedic surgery) Admission status: Inpatient/MedSurg   Reubin Milan MD [facility] Pager [phone].  If 7PM-7AM, please contact night-coverage [email or web address] Password [facility]  04/21/2017, 3:53 AM

## 2017-04-21 NOTE — Consult Note (Signed)
Orthopaedic Trauma Service (OTS) Consult   Patient ID: [name] MRN: [id number] DOB/AGE: May 30, 1941 76 y.o.   Reason for Consult: Left supracondylar distal femur fracture, peri-implant Referring Physician: Victorino December, MD (ortho)   HPI: [name] is an 77 y.o.white female with extensive medical history including CAD, aortic stenosis and aortic insufficiency, carotid stenosis and nonalcoholic steatohepatitis who is actively recovering from a left hip fracture which was repaired at the end of June. Patient had intramedullary nailing for hip fracture. This was from a ground-level fall. She has been recovering at a skilled nursing center in [place]. She was attempting to move a walker out of the way when the walker fell and she tripped over it hitting the door jam. This resulted in immediate onset of pain in her left leg as well as inability to bear weight. She was brought to the hospital for evaluation where she was found to have a left supracondylar distal femur fracture around her intramedullary nail. Patient was transferred to [place] for definitive evaluation. She was seen by Dr. Stann Mainland orthopedics who felt this this was added the scope his practice and requested consultation from the orthopedic trauma service.  Patient seen and evaluated on 04/21/2017. We initially scheduled the patient for surgery on 04/21/2017 however she ate breakfast and surgery was canceled for the day. She is rescheduled for 04/23/2017 0800.  She is resting comfortably in bed. Only reports increased pain with movement. She is comfortable with rest and pain medicine. Denies any additional injuries elsewhere. Denies any numbness or tingling in her lower extremities.   Patient states that she is ambulatory without any assistive devices   A prior to her hip fracture she was living alone in a house. She has been living in a skilled [place] for the last 6 weeks or so.   Patient thinks that her last  DEXA scan was about 4 years ago. She does not take any supplementation for her bone health including vitamin D or calcium. She does not think any other pharmacologic agents for her bone health. Prior to admission medication list notes vitamin D but patient states that she is not taking  Past Medical History:  Diagnosis Date  . Anemia   . Aortic insufficiency    Moderate  . Back pain   . Carotid stenosis, bilateral   . Cirrhosis ([place])   . Closed left femoral fracture ([place]) 04/21/2017  . Coronary artery disease    Stent x 2 RCA 1995, cardiac catheterization 09/2016 showing only mild atherosclerosis  . DDD (degenerative disc disease), lumbar   . Essential hypertension   . GERD (gastroesophageal reflux disease)   . Hiatal hernia   . History of stroke   . Hypercholesteremia   . IBS (irritable bowel syndrome)   . Myocardial infarction ([place]) 1995  . Non-alcoholic fatty liver disease   . OSA (obstructive sleep apnea)    Sleep study 2009  AHI 9.91/hr and during REM sleep 35.58/hr  . Pericardial effusion    a. small by echo 2018.  Marland Kitchen PSVT (paroxysmal supraventricular tachycardia) ([place])   . Recurrent UTI   . Type 2 diabetes mellitus ([place])   . Varices, esophageal ([place])     Past Surgical History:  Procedure Laterality Date  . [place]  . APPENDECTOMY    . BACK SURGERY  1985  . CARDIAC CATHETERIZATION  [phone]   Stent to the proximal RCA after MI   . CARDIAC CATHETERIZATION N/A 09/26/2016   Procedure:  Left Heart Cath and Coronary Angiography;  Surgeon: Leonie Man, MD;  Location: [facility];  Service: Cardiovascular;  Laterality: N/A;  . CATARACT EXTRACTION W/PHACO Right 07/04/2014   Procedure: CATARACT EXTRACTION PHACO AND INTRAOCULAR LENS PLACEMENT (IOC);  Surgeon: Elta Guadeloupe T. Gershon Crane, MD;  Location: [facility];  Service: Ophthalmology;  Laterality: Right;  CDE:13.13  . COLONOSCOPY    . DILATION AND CURETTAGE OF UTERUS     x2  . Epi Retinal Membrane Peel  Left   . ERCP    . ESOPHAGEAL BANDING N/A 04/01/2013   Procedure: ESOPHAGEAL BANDING;  Surgeon: Rogene Houston, MD;  Location: [facility];  Service: Endoscopy;  Laterality: N/A;  . ESOPHAGEAL BANDING N/A 05/24/2013   Procedure: ESOPHAGEAL BANDING;  Surgeon: Rogene Houston, MD;  [facility];  Service: Endoscopy;  Laterality: N/A;  . ESOPHAGEAL BANDING N/A 06/21/2014   Procedure: ESOPHAGEAL BANDING;  Surgeon: Rogene Houston, MD;  [facility];  Service: Endoscopy;  Laterality: N/A;  . ESOPHAGOGASTRODUODENOSCOPY N/A 04/01/2013   Procedure: ESOPHAGOGASTRODUODENOSCOPY (EGD);  Surgeon: Rogene Houston, MD;  [facility];  Service: Endoscopy;  Laterality: N/A;  230-rescheduled to 8:30am Ann notified pt  . ESOPHAGOGASTRODUODENOSCOPY N/A 05/24/2013   Procedure: ESOPHAGOGASTRODUODENOSCOPY (EGD);  Surgeon: Rogene Houston, MD;  [facility];  Service: Endoscopy;  Laterality: N/A;  730  . ESOPHAGOGASTRODUODENOSCOPY N/A 06/21/2014   Procedure: ESOPHAGOGASTRODUODENOSCOPY (EGD);  Surgeon: Rogene Houston, MD;  [facility];  Service: Endoscopy;  Laterality: N/A;  930-rescheduled 10/14 @ 1200 Ann to notify pt  . EYE SURGERY  08   cataract surgery of the left eye  . FEMUR IM NAIL Left 03/02/2017   Procedure: INTRAMEDULLARY (IM) NAIL FEMORAL;  Surgeon: Rod Can, MD;  Location: [facility];  Service: Orthopedics;  Laterality: Left;  . HARDWARE REMOVAL Right 01/17/2013   Procedure: REMOVAL OF HARDWARE AND EXCISION ULNAR STYLOID RIGHT WRIST;  Surgeon: Tennis Must, MD;  Location: [facility];  Service: Orthopedics;  Laterality: Right;  . MYRINGOTOMY  2012   both ears  . TONSILLECTOMY    . VAGINAL HYSTERECTOMY  1972  . WRIST SURGERY     rt wrist hardwear removal    Family History  Problem Relation Age of Onset  . Diabetes Mother   . Heart failure Father   . Heart failure Maternal Aunt     Social History:  reports that she quit  smoking about 5 years ago. Her smoking use included Cigarettes. She has a 12.50 pack-year smoking history. She has never used smokeless tobacco. She reports that she does not drink alcohol or use drugs.  Allergies:  Allergies  Allergen Reactions  . Tape Rash and Other (See Comments)    Please use paper tape    Medications:  I have reviewed the patient's current medications. Prior to Admission:  Prescriptions Prior to Admission  Medication Sig Dispense Refill Last Dose  . Amino Acids-Protein Hydrolys (FEEDING SUPPLEMENT, PRO-STAT SUGAR FREE 64,) LIQD Take 30 mLs by mouth 2 (two) times daily.   04/20/2017 at Unknown time  . aspirin 325 MG tablet Take 325 mg by mouth daily.    04/20/2017 at Unknown time  . carvedilol (COREG) 12.5 MG tablet Take 12.5 mg by mouth 2 (two) times daily with a meal.    04/20/2017 at 0900  . Cholecalciferol (VITAMIN D3) 2000 units TABS Take 2,000 Units by mouth daily.   04/20/2017 at Unknown time  .  ferrous sulfate 325 (65 FE) MG tablet Take 1 tablet (325 mg total) by mouth 3 (three) times daily after meals.  3 04/20/2017 at Unknown time  . folic acid (FOLVITE) 1 MG tablet Take 1 tablet (1 mg total) by mouth daily.   04/20/2017 at Unknown time  . ibuprofen (ADVIL) 200 MG tablet Take 200 mg by mouth every 6 (six) hours as needed for headache or moderate pain.    Past Week at Unknown time  . metFORMIN (GLUCOPHAGE) 500 MG tablet Take 500 mg by mouth daily with breakfast.   04/20/2017 at Unknown time  . ondansetron (ZOFRAN) 4 MG tablet Take 4 mg by mouth every 8 (eight) hours as needed for nausea or vomiting.   Past Week at Unknown time  . pantoprazole (PROTONIX) 40 MG tablet Take 40 mg by mouth daily.   04/20/2017 at Unknown time  . phenytoin (DILANTIN) 100 MG ER capsule Take 100 mg by mouth 2 (two) times daily.   04/20/2017 at Unknown time  . polyethylene glycol (MIRALAX / GLYCOLAX) packet Take 17 g by mouth daily. 14 each 0 04/20/2017 at Unknown time  . potassium chloride SA  (K-DUR,KLOR-CON) 20 MEQ tablet Take 1 tablet (20 mEq total) by mouth daily. 90 tablet 3 04/20/2017 at Unknown time  . senna-docusate (SENNA PLUS) 8.6-50 MG tablet Take 1 tablet by mouth 2 (two) times daily.   04/20/2017 at Unknown time  . vitamin B-12 1000 MCG tablet Take 1 tablet (1,000 mcg total) by mouth daily.   04/20/2017 at Unknown time  . vitamin C (ASCORBIC ACID) 500 MG tablet Take 500 mg by mouth 3 (three) times daily.   04/20/2017 at Unknown time   Scheduled: . aspirin  324 mg Oral QHS  . carvedilol  12.5 mg Oral BID WC  . Chlorhexidine Gluconate Cloth  6 each Topical Q0600  . docusate sodium  100 mg Oral Daily  . feeding supplement (GLUCERNA SHAKE)  237 mL Oral BID WC  . ferrous sulfate  325 mg Oral Q breakfast  . folic acid  1 mg Oral Daily  . heparin  5,000 Units Subcutaneous Q8H  . mupirocin ointment  1 application Nasal BID  . pantoprazole  40 mg Oral Daily  . phenytoin  100 mg Oral BID  . potassium chloride SA  20 mEq Oral Daily    Results for orders placed or performed during the hospital encounter of 04/21/17 (from the past 48 hour(s))  CBC with Differential     Status: Abnormal   Collection Time: 04/21/17  1:03 AM  Result Value Ref Range   WBC 9.9 4.0 - 10.5 K/uL   RBC 3.38 (L) 3.87 - 5.11 MIL/uL   Hemoglobin 10.7 (L) 12.0 - 15.0 g/dL   HCT 31.2 (L) 36.0 - 46.0 %   MCV 92.3 78.0 - 100.0 fL   MCH 31.7 26.0 - 34.0 pg   MCHC 34.3 30.0 - 36.0 g/dL   RDW 14.3 11.5 - 15.5 %   Platelets 129 (L) 150 - 400 K/uL   Neutrophils Relative % 75 %   Neutro Abs 7.5 1.7 - 7.7 K/uL   Lymphocytes Relative 13 %   Lymphs Abs 1.3 0.7 - 4.0 K/uL   Monocytes Relative 11 %   Monocytes Absolute 1.0 0.1 - 1.0 K/uL   Eosinophils Relative 1 %   Eosinophils Absolute 0.1 0.0 - 0.7 K/uL   Basophils Relative 0 %   Basophils Absolute 0.0 0.0 - 0.1 K/uL  Basic metabolic panel  Status: Abnormal   Collection Time: 04/21/17  1:03 AM  Result Value Ref Range   Sodium 134 (L) 135 - 145 mmol/L    Potassium 4.3 3.5 - 5.1 mmol/L   Chloride 104 101 - 111 mmol/L   CO2 23 22 - 32 mmol/L   Glucose, Bld 139 (H) 65 - 99 mg/dL   BUN 15 6 - 20 mg/dL   Creatinine, Ser 0.53 0.44 - 1.00 mg/dL   Calcium 8.5 (L) 8.9 - 10.3 mg/dL   GFR calc non Af Amer >60 >60 mL/min   GFR calc Af Amer >60 >60 mL/min    Comment: (NOTE) The eGFR has been calculated using the CKD EPI equation. This calculation has not been validated in all clinical situations. eGFR's persistently <60 mL/min signify possible Chronic Kidney Disease.    Anion gap 7 5 - 15  Type and screen [place]     Status: None   Collection Time: 04/21/17  1:12 AM  Result Value Ref Range   ABO/RH(D) A POS    Antibody Screen NEG    Sample Expiration 04/24/2017   ABO/Rh     Status: None   Collection Time: 04/21/17  1:12 AM  Result Value Ref Range   ABO/RH(D) A POS   Phenytoin level, total     Status: None   Collection Time: 04/21/17  1:57 AM  Result Value Ref Range   Phenytoin Lvl 10.6 10.0 - 20.0 ug/mL  Urinalysis, Routine w reflex microscopic     Status: Abnormal   Collection Time: 04/21/17  2:00 AM  Result Value Ref Range   Color, Urine AMBER (A) YELLOW    Comment: BIOCHEMICALS MAY BE AFFECTED BY COLOR   APPearance HAZY (A) CLEAR   Specific Gravity, Urine 1.027 1.005 - 1.030   pH 5.0 5.0 - 8.0   Glucose, UA NEGATIVE NEGATIVE mg/dL   Hgb urine dipstick NEGATIVE NEGATIVE   Bilirubin Urine SMALL (A) NEGATIVE   Ketones, ur NEGATIVE NEGATIVE mg/dL   Protein, ur NEGATIVE NEGATIVE mg/dL   Nitrite POSITIVE (A) NEGATIVE   Leukocytes, UA LARGE (A) NEGATIVE   RBC / HPF 0-5 0 - 5 RBC/hpf   WBC, UA TOO NUMEROUS TO COUNT 0 - 5 WBC/hpf   Bacteria, UA MANY (A) NONE SEEN   Squamous Epithelial / LPF 0-5 (A) NONE SEEN   Mucous PRESENT    Hyaline Casts, UA PRESENT   CBC     Status: Abnormal   Collection Time: 04/21/17  6:21 AM  Result Value Ref Range   WBC 8.0 4.0 - 10.5 K/uL   RBC 3.46 (L) 3.87 - 5.11 MIL/uL    Hemoglobin 10.6 (L) 12.0 - 15.0 g/dL   HCT 31.8 (L) 36.0 - 46.0 %   MCV 91.9 78.0 - 100.0 fL   MCH 30.6 26.0 - 34.0 pg   MCHC 33.3 30.0 - 36.0 g/dL   RDW 14.3 11.5 - 15.5 %   Platelets 137 (L) 150 - 400 K/uL  Comprehensive metabolic panel     Status: Abnormal   Collection Time: 04/21/17  6:21 AM  Result Value Ref Range   Sodium 135 135 - 145 mmol/L   Potassium 3.9 3.5 - 5.1 mmol/L   Chloride 103 101 - 111 mmol/L   CO2 24 22 - 32 mmol/L   Glucose, Bld 115 (H) 65 - 99 mg/dL   BUN 13 6 - 20 mg/dL   Creatinine, Ser 0.58 0.44 - 1.00 mg/dL   Calcium 8.2 (L)  8.9 - 10.3 mg/dL   Total Protein 4.6 (L) 6.5 - 8.1 g/dL   Albumin 2.5 (L) 3.5 - 5.0 g/dL   AST 34 15 - 41 U/L   ALT 38 14 - 54 U/L   Alkaline Phosphatase 241 (H) 38 - 126 U/L   Total Bilirubin 1.1 0.3 - 1.2 mg/dL   GFR calc non Af Amer >60 >60 mL/min   GFR calc Af Amer >60 >60 mL/min    Comment: (NOTE) The eGFR has been calculated using the CKD EPI equation. This calculation has not been validated in all clinical situations. eGFR's persistently <60 mL/min signify possible Chronic Kidney Disease.    Anion gap 8 5 - 15  Type and screen [place]     Status: None   Collection Time: 04/21/17  6:25 AM  Result Value Ref Range   ABO/RH(D) A POS    Antibody Screen NEG    Sample Expiration 04/24/2017   Surgical pcr screen     Status: Abnormal   Collection Time: 04/21/17  8:01 AM  Result Value Ref Range   MRSA, PCR POSITIVE (A) NEGATIVE    Comment: RESULT CALLED TO, READ BACK BY AND VERIFIED WITH: Michele Rockers RN, AT 1021 04/21/17 BY D. VANHOOK    Staphylococcus aureus POSITIVE (A) NEGATIVE    Comment:        The Xpert SA Assay (FDA approved for NASAL specimens in patients over 76 years of age), is one component of a comprehensive surveillance program.  Test performance has been validated by [facility] for patients greater than or equal to 68 year old. It is not intended to diagnose infection nor to guide  or monitor treatment.   Glucose, capillary     Status: Abnormal   Collection Time: 04/21/17  8:24 AM  Result Value Ref Range   Glucose-Capillary 164 (H) 65 - 99 mg/dL  Glucose, capillary     Status: Abnormal   Collection Time: 04/21/17 11:19 AM  Result Value Ref Range   Glucose-Capillary 129 (H) 65 - 99 mg/dL    Chest Portable 1 View  Result Date: 04/21/2017 CLINICAL DATA:  Diabetes.  Preoperative chest x-ray. EXAM: PORTABLE CHEST 1 VIEW COMPARISON:  04/21/2017. FINDINGS: Mediastinum hilar structures normal. Heart size normal. No focal infiltrate. Stable mild left base pleural thickening most consistent with scar. Degenerative changes thoracic spine. IMPRESSION: No acute cardiopulmonary disease.  Mild left base pleural scarring. Electronically Signed   By: Marcello Moores  Register   On: 04/21/2017 07:40   Dg Chest Port 1 View  Result Date: 04/21/2017 CLINICAL DATA:  Preoperative chest radiograph for femur fracture. Initial encounter. EXAM: PORTABLE CHEST 1 VIEW COMPARISON:  Chest radiograph performed 02/28/2017 FINDINGS: The lungs are well-aerated and clear. There is no evidence of focal opacification, pleural effusion or pneumothorax. The cardiomediastinal silhouette is within normal limits. No acute osseous abnormalities are seen. IMPRESSION: No acute cardiopulmonary process seen. No displaced rib fractures identified. Electronically Signed   By: Garald Balding M.D.   On: 04/21/2017 01:48   Dg Hip Port Unilat With Pelvis 1v Left  Result Date: 04/21/2017 CLINICAL DATA:  Preop, femur fracture EXAM: DG HIP (WITH OR WITHOUT PELVIS) 1V PORT LEFT COMPARISON:  04/21/2017, 03/02/2017 FINDINGS: Pubic symphysis is intact. The pubic rami appear normal. Partially visualized fixating rod in the proximal left femur. Comminuted intertrochanteric fracture with some callus formation noted since the prior radiograph. Vascular calcification. IMPRESSION: Partially visualized left femoral fixating rod across comminuted  intertrochanteric fracture with  some bony callus noted since the prior study. Electronically Signed   By: Donavan Foil M.D.   On: 04/21/2017 02:26   Dg Femur Min 2 Views Left  Result Date: 04/21/2017 CLINICAL DATA:  [place] and hit leg EXAM: LEFT FEMUR 2 VIEWS COMPARISON:  03/02/2017, 02/28/2017 FINDINGS: Status post internal fixation of remote left proximal femoral fracture. There is an acute fracture involving the distal shaft of the femur around the fixating rod, this demonstrates about 1/4 shaft diameter of medial displacement of the distal fracture fragment and about 1/3 shaft diameter of posterior displacement of distal fracture fragment. The surgical rod is displaced anteriorly and projects over the soft tissues of the distal anterior thigh. Vascular calcifications. Some healing changes are noted about the trochanteric fracture and the alignment here is grossly similar compared to the prior radiographs. IMPRESSION: 1. Status post left femoral rod and screw fixation of a prior left trochanteric fracture. Now seen is an acute displaced fracture involving the distal shaft of the femur. There is anterior displacement of the distal portion of the femoral rod which projects over the anterior soft tissues of the distal thigh/upper knee 2. Proximal femoral fracture grossly similar in alignment with some bony healing changes noted at the fracture site. Electronically Signed   By: Donavan Foil M.D.   On: 04/21/2017 01:09    Review of Systems  Constitutional: Negative for chills and fever.  Respiratory: Negative for shortness of breath and wheezing.   Cardiovascular: Negative for chest pain and palpitations.  Gastrointestinal: Negative for abdominal pain, nausea and vomiting.  Musculoskeletal:       Left Leg pain   Neurological: Negative for tingling and sensory change.   Blood pressure (!) 132/47, pulse 71, temperature 98.3 F (36.8 C), temperature source Oral, resp. rate 17, SpO2 97 %. Physical Exam   Constitutional:  Very pleasant 76 year old white female appears appropriate for stated age. She is alert and cooperative no acute distress  Cardiovascular: S1 normal and S2 normal.   + murmur   Pulmonary/Chest:  Clear anterior fields   Abdominal:  + BS, NTND  Musculoskeletal:  Pelvis     no traumatic wounds or rash, no ecchymosis, stable to manual stress, nontender  Left Lower Extremity  Inspection:   Mild thigh swelling   No open wounds or lesions   Leg is propped up on pillows   Profound equinovalgus deformity at the foot/ankle Bony eval:    Foot and ankle are nontender, lower leg is nontender.     Significant tenderness palpation over the distal femur    No tenderness palpation of the hip Soft tissue:     swelling distal femur. No ecchymosis appreciated at this time     Knee stability not assessed  ROM:     Knee and hip range of motion not assessed      Profound restriction in ankle and foot range of motion     patient is about -5 with ankle extension passively  Sensation:     DPN, SPN and TN sensory functions are grossly intact Motor:    With attempted active ankle extension of the foot and ankle inverts and extends minimally    EHL function is grossly intact    FHL is intact, lesser toe motor functions are intact Vascular:    Palpable dorsalis pedis pulse    Extremity is warm    Compartments are soft         Assessment/Plan:  76 year old female ground-level fall with left  supracondylar distal femur fracture, peri-implant  -fall  - Left supracondylar distal femur fracture, peri-implant  Patient has a complex fracture pattern. We will attempt to retain current IM nail as her fracture does not demonstrate adequate healing. We are hopeful to remove the distal locking bolts and proceed with reduction of the distal fragment back over the nail. We would then proceed with plating of the distal femur.   patient will be nonweightbearing for 6 weeks or  so  Unrestricted range of motion postoperatively   Continue with ice and elevation for now  - Left foot/ankle equinovarus deformity/contracture   Patient states that she's had this all of her life and has compensated for it. She also states that she does not use any assistive devices or bracing.  Monitor  Contracture seems severe enough that it may pose a fall risk  - Pain management:  Continue with current regimen  Pt quite comfortable   - ABL anemia/Hemodynamics  Stable  Monitor  - Medical issues   Per primary service   UTI   On rocephin   Awaiting final C&S   - DVT/PE prophylaxis:  Sq heparin   - Metabolic Bone Disease:  Check vitamin d levels  Needs DEXA  Suspect very poor bone quality   - Activity:  NWB L leg   - FEN/GI prophylaxis/Foley/Lines:  Diet as tolerated  Foley   - Impediments to fracture healing:  Osteoporosis  Deconditioning  - Dispo:  OR Thursday for ORIF L femur    Jari Pigg, PA-C [facility] [phone] (P) 04/21/2017, 4:36 PM

## 2017-04-22 DIAGNOSIS — Y92009 Unspecified place in unspecified non-institutional (private) residence as the place of occurrence of the external cause: Secondary | ICD-10-CM

## 2017-04-22 DIAGNOSIS — S72012A Unspecified intracapsular fracture of left femur, initial encounter for closed fracture: Secondary | ICD-10-CM

## 2017-04-22 DIAGNOSIS — S72402A Unspecified fracture of lower end of left femur, initial encounter for closed fracture: Secondary | ICD-10-CM

## 2017-04-22 DIAGNOSIS — G4700 Insomnia, unspecified: Secondary | ICD-10-CM

## 2017-04-22 LAB — COMPREHENSIVE METABOLIC PANEL
ALBUMIN: 2.4 g/dL — AB (ref 3.5–5.0)
ALK PHOS: 226 U/L — AB (ref 38–126)
ALT: 30 U/L (ref 14–54)
ANION GAP: 6 (ref 5–15)
AST: 23 U/L (ref 15–41)
BILIRUBIN TOTAL: 0.8 mg/dL (ref 0.3–1.2)
BUN: 10 mg/dL (ref 6–20)
CALCIUM: 8.4 mg/dL — AB (ref 8.9–10.3)
CO2: 27 mmol/L (ref 22–32)
Chloride: 108 mmol/L (ref 101–111)
Creatinine, Ser: 0.58 mg/dL (ref 0.44–1.00)
GFR calc Af Amer: >60 mL/min (ref >60)
GFR calc non Af Amer: >60 mL/min (ref >60)
GLUCOSE: 104 mg/dL — AB (ref 65–99)
Potassium: 4.7 mmol/L (ref 3.5–5.1)
SODIUM: 141 mmol/L (ref 135–145)
Total Protein: 4.6 g/dL — ABNORMAL LOW (ref 6.5–8.1)

## 2017-04-22 LAB — CBC WITH DIFFERENTIAL/PLATELET
Basophils Absolute: 0 10*3/uL (ref 0.0–0.1)
Basophils Relative: 0 %
EOS ABS: 0.2 10*3/uL (ref 0.0–0.7)
Eosinophils Relative: 3 %
HEMATOCRIT: 30.2 % — AB (ref 36.0–46.0)
HEMOGLOBIN: 9.8 g/dL — AB (ref 12.0–15.0)
LYMPHS ABS: 1.1 10*3/uL (ref 0.7–4.0)
Lymphocytes Relative: 18 %
MCH: 30.2 pg (ref 26.0–34.0)
MCHC: 32.5 g/dL (ref 30.0–36.0)
MCV: 93.2 fL (ref 78.0–100.0)
MONO ABS: 0.9 10*3/uL (ref 0.1–1.0)
MONOS PCT: 14 %
NEUTROS ABS: 4 10*3/uL (ref 1.7–7.7)
Neutrophils Relative %: 65 %
PLATELETS: 115 10*3/uL — AB (ref 150–400)
RBC: 3.24 MIL/uL — ABNORMAL LOW (ref 3.87–5.11)
RDW: 14.4 % (ref 11.5–15.5)
WBC: 6.1 10*3/uL (ref 4.0–10.5)

## 2017-04-22 LAB — PROTIME-INR
INR: 1.24
Prothrombin Time: 15.7 seconds — ABNORMAL HIGH (ref 11.4–15.2)

## 2017-04-22 LAB — PREALBUMIN: Prealbumin: 11.1 mg/dL — ABNORMAL LOW (ref 18–38)

## 2017-04-22 LAB — GLUCOSE, CAPILLARY
GLUCOSE-CAPILLARY: 121 mg/dL — AB (ref 65–99)
GLUCOSE-CAPILLARY: 139 mg/dL — AB (ref 65–99)
Glucose-Capillary: 93 mg/dL (ref 65–99)
Glucose-Capillary: 97 mg/dL (ref 65–99)

## 2017-04-22 LAB — MAGNESIUM: MAGNESIUM: 1.7 mg/dL (ref 1.7–2.4)

## 2017-04-22 LAB — TSH: TSH: 2.288 u[IU]/mL (ref 0.350–4.500)

## 2017-04-22 LAB — PHOSPHORUS: PHOSPHORUS: 4.8 mg/dL — AB (ref 2.5–4.6)

## 2017-04-22 MED ORDER — CEFAZOLIN SODIUM-DEXTROSE 1-4 GM/50ML-% IV SOLN
1.0000 g | INTRAVENOUS | Status: AC
  Administered 2017-04-23: 1 g via INTRAVENOUS
  Filled 2017-04-22: qty 50

## 2017-04-22 NOTE — Progress Notes (Signed)
PROGRESS NOTE   [name]  WNI:[id number]    DOB: 02-14-1941    DOA: 04/21/2017  PCP: Redmond School, MD    Brief Narrative:  76 year old female with PMH of anemia, mild-moderate AS & AI, bilateral carotid stenosis, cirrhosis due to NASH with esophageal varices & banding, CAD status post remote stent, cardiac cath 09/2016 showed only mild atherosclerosis/nonobstructive disease, PSVT, HTN, GERD, stroke, seizures, HLD, OSA, type II DM, s/p left femur IM nail 03/02/17 following which she was undergoing rehabilitation at [facility] in [place] and apparently doing well with ambulation and activities, presented to ED on 04/21/17 after a mechanical fall and sustained a left femur fracture. Orthopedic consulted, unfortunately patient had already eaten breakfast on 8/14 and hence surgery scheduled for 8/16.   Assessment & Plan:   Principal Problem:   Closed left femoral fracture (HCC) Active Problems:   DM type 2 (diabetes mellitus, type 2) (HCC)   OSA (obstructive sleep apnea)   Aortic insufficiency   HTN (hypertension)   Lower urinary tract infectious disease   Seizure disorder ([place])   1. Complex left femur peri-implant fracture: Secondary to mechanical fall at SNF. Ortho consulted and plans are for operation on tomorrow 8/16 2. Diet-controlled DM 2: Continue to monitor CBGs and for now control with diet. 3. Anemia: Stable. Continue ferrous sulfate. 4. Thrombocytopenia: Stable. Related to cirrhosis. No bleeding reported. Follow CBCs. 5. Seizure disorder: Continue Dilantin. No recent seizures reported.  6. OSA: Not on CPAP. 7. Essential hypertension: Controlled. Continue carvedilol. 8. Acute cystitis: Continue IV ceftriaxone pending urine culture results. 9. PSVT: As per outpatient cardiology notes, previous monitor showed infrequent PACs but clinically from notes suspected PSVT that was not captured on monitor. No recent palpitations reported. Continue carvedilol. 10. CAD, history of MI  in 1995, status post 2 stents to RCA: Asymptomatic of exertional dyspnea or chest pain. Cardiac cath January 2018 showed nonobstructive CAD. Echo 10/2016: LVEF 65-70 percent. Continue aspirin and carvedilol. 11. Carotid stenosis: Continue aspirin. 12. NASH Cirrhosis complicated by esophageal varices & banding, thrombocytopenia: No bleeding reported. INR in June was 1.28. Continue folate. 13. GERD: stable contue, PPI.   DVT prophylaxis: Subcutaneous heparin  Code Status: Full Family Communication: None at bedside Disposition: Return to SNF post surgery   Consultants:  Orthopedics   Procedures:  Foley catheter  Antimicrobials:  IV ceftriaxone    Subjective: Pt has no new complaints reported to me today.   Objective:  Vitals:   04/21/17 1657 04/21/17 2007 04/22/17 0600 04/22/17 1628  BP: (!) 111/44 (!) 114/41 (!) 122/48 (!) 115/31  Pulse: 64 69 78 92  Resp:  15  16  Temp:  98.2 F (36.8 C) 98.7 F (37.1 C) 98.7 F (37.1 C)  TempSrc:  Axillary Oral Oral  SpO2: 98% 98% 96% 98%    Examination:  General exam: Pt in nad, alert and awake Respiratory system: Clear to auscultation. Respiratory effort normal. Equal chest rise. Cardiovascular system: S1 & S2 heard, RRR. No JVD, murmurs, rubs, gallops or clicks. No pedal edema. Gastrointestinal system: Abdomen is nondistended, soft and nontender. No organomegaly or masses felt. Normal bowel sounds heard. Central nervous system: Alert and oriented. No focal neurological deficits. Extremities: Symmetric 5 x 5 power except left lower extremity where evaluation is limited due to pain. Swelling, increased warmth and tenderness of distal left thigh and knee.  Skin: No rashes, lesions or ulcers, on limited exam. Psychiatry: Judgement and insight appear normal. Mood & affect appropriate.  Data Reviewed: I have personally reviewed following labs and imaging studies  CBC:  Recent Labs Lab 04/21/17 0103 04/21/17 0621  04/22/17 0514  WBC 9.9 8.0 6.1  NEUTROABS 7.5  --  4.0  HGB 10.7* 10.6* 9.8*  HCT 31.2* 31.8* 30.2*  MCV 92.3 91.9 93.2  PLT 129* 137* 993*   Basic Metabolic Panel:  Recent Labs Lab 04/21/17 0103 04/21/17 0621 04/22/17 0514  NA 134* 135 141  K 4.3 3.9 4.7  CL 104 103 108  CO2 23 24 27   GLUCOSE 139* 115* 104*  BUN 15 13 10   CREATININE 0.53 0.58 0.58  CALCIUM 8.5* 8.2* 8.4*  MG  --   --  1.7  PHOS  --   --  4.8*   Liver Function Tests:  Recent Labs Lab 04/21/17 0621 04/22/17 0514  AST 34 23  ALT 38 30  ALKPHOS 241* 226*  BILITOT 1.1 0.8  PROT 4.6* 4.6*  ALBUMIN 2.5* 2.4*   Coagulation Profile:  Recent Labs Lab 04/22/17 0514  INR 1.24   Cardiac Enzymes: No results for input(s): CKTOTAL, CKMB, CKMBINDEX, TROPONINI in the last 168 hours. HbA1C: No results for input(s): HGBA1C in the last 72 hours. CBG:  Recent Labs Lab 04/21/17 1119 04/21/17 1655 04/21/17 2158 04/22/17 0620 04/22/17 1152  GLUCAP 129* 136* 93 93 121*    Recent Results (from the past 240 hour(s))  Urine culture     Status: Abnormal (Preliminary result)   Collection Time: 04/21/17  2:00 AM  Result Value Ref Range Status   Specimen Description URINE, CATHETERIZED  Final   Special Requests NONE  Final   Culture (A)  Final    >=100,000 COLONIES/mL ESCHERICHIA COLI SUSCEPTIBILITIES TO FOLLOW Performed at [facility] [address], [place], [place] [address]    Report Status PENDING  Incomplete  Surgical pcr screen     Status: Abnormal   Collection Time: 04/21/17  8:01 AM  Result Value Ref Range Status   MRSA, PCR POSITIVE (A) NEGATIVE Final    Comment: RESULT CALLED TO, READ BACK BY AND VERIFIED WITH: Michele Rockers RN, AT 1021 04/21/17 BY D. VANHOOK    Staphylococcus aureus POSITIVE (A) NEGATIVE Final    Comment:        The Xpert SA Assay (FDA approved for NASAL specimens in patients over 4 years of age), is one component of a comprehensive surveillance program.  Test  performance has been validated by [facility] for patients greater than or equal to 27 year old. It is not intended to diagnose infection nor to guide or monitor treatment.          Radiology Studies: Chest Portable 1 View  Result Date: 04/21/2017 CLINICAL DATA:  Diabetes.  Preoperative chest x-ray. EXAM: PORTABLE CHEST 1 VIEW COMPARISON:  04/21/2017. FINDINGS: Mediastinum hilar structures normal. Heart size normal. No focal infiltrate. Stable mild left base pleural thickening most consistent with scar. Degenerative changes thoracic spine. IMPRESSION: No acute cardiopulmonary disease.  Mild left base pleural scarring. Electronically Signed   By: Marcello Moores  Register   On: 04/21/2017 07:40   Dg Chest Port 1 View  Result Date: 04/21/2017 CLINICAL DATA:  Preoperative chest radiograph for femur fracture. Initial encounter. EXAM: PORTABLE CHEST 1 VIEW COMPARISON:  Chest radiograph performed 02/28/2017 FINDINGS: The lungs are well-aerated and clear. There is no evidence of focal opacification, pleural effusion or pneumothorax. The cardiomediastinal silhouette is within normal limits. No acute osseous abnormalities are seen. IMPRESSION: No acute cardiopulmonary process  seen. No displaced rib fractures identified. Electronically Signed   By: Garald Balding M.D.   On: 04/21/2017 01:48   Dg Hip Port Unilat With Pelvis 1v Left  Result Date: 04/21/2017 CLINICAL DATA:  Preop, femur fracture EXAM: DG HIP (WITH OR WITHOUT PELVIS) 1V PORT LEFT COMPARISON:  04/21/2017, 03/02/2017 FINDINGS: Pubic symphysis is intact. The pubic rami appear normal. Partially visualized fixating rod in the proximal left femur. Comminuted intertrochanteric fracture with some callus formation noted since the prior radiograph. Vascular calcification. IMPRESSION: Partially visualized left femoral fixating rod across comminuted intertrochanteric fracture with some bony callus noted since the prior study. Electronically Signed   By: Donavan Foil M.D.   On: 04/21/2017 02:26   Dg Femur Min 2 Views Left  Result Date: 04/21/2017 CLINICAL DATA:  [place] and hit leg EXAM: LEFT FEMUR 2 VIEWS COMPARISON:  03/02/2017, 02/28/2017 FINDINGS: Status post internal fixation of remote left proximal femoral fracture. There is an acute fracture involving the distal shaft of the femur around the fixating rod, this demonstrates about 1/4 shaft diameter of medial displacement of the distal fracture fragment and about 1/3 shaft diameter of posterior displacement of distal fracture fragment. The surgical rod is displaced anteriorly and projects over the soft tissues of the distal anterior thigh. Vascular calcifications. Some healing changes are noted about the trochanteric fracture and the alignment here is grossly similar compared to the prior radiographs. IMPRESSION: 1. Status post left femoral rod and screw fixation of a prior left trochanteric fracture. Now seen is an acute displaced fracture involving the distal shaft of the femur. There is anterior displacement of the distal portion of the femoral rod which projects over the anterior soft tissues of the distal thigh/upper knee 2. Proximal femoral fracture grossly similar in alignment with some bony healing changes noted at the fracture site. Electronically Signed   By: Donavan Foil M.D.   On: 04/21/2017 01:09        Scheduled Meds: . aspirin  324 mg Oral QHS  . carvedilol  12.5 mg Oral BID WC  . Chlorhexidine Gluconate Cloth  6 each Topical Q0600  . docusate sodium  100 mg Oral Daily  . feeding supplement (GLUCERNA SHAKE)  237 mL Oral BID WC  . ferrous sulfate  325 mg Oral Q breakfast  . folic acid  1 mg Oral Daily  . heparin  5,000 Units Subcutaneous Q8H  . mupirocin ointment  1 application Nasal BID  . pantoprazole  40 mg Oral Daily  . phenytoin  100 mg Oral BID  . potassium chloride SA  20 mEq Oral Daily   Continuous Infusions: . [START ON 04/23/2017]  ceFAZolin (ANCEF) IV    .  cefTRIAXone (ROCEPHIN)  IV Stopped (04/22/17 3343)     LOS: 1 day     Velvet Bathe, MD, FACP, FHM. [facility] Pager [phone]  If 7PM-7AM, please contact night-coverage [email or web address] Password [facility] 04/22/2017, 5:05 PM

## 2017-04-23 ENCOUNTER — Inpatient Hospital Stay (HOSPITAL_COMMUNITY): Payer: Medicare Other | Admitting: Anesthesiology

## 2017-04-23 ENCOUNTER — Encounter (HOSPITAL_COMMUNITY)
Admission: EM | Disposition: A | Discharge status: Skilled Nursing Facility | Payer: Self-pay | Source: Home / Self Care | Attending: Family Medicine

## 2017-04-23 ENCOUNTER — Inpatient Hospital Stay (HOSPITAL_COMMUNITY): Payer: Medicare Other

## 2017-04-23 ENCOUNTER — Encounter (HOSPITAL_COMMUNITY): Payer: Self-pay | Admitting: Certified Registered"

## 2017-04-23 HISTORY — PX: ORIF FEMUR FRACTURE: SHX2119

## 2017-04-23 LAB — URINE CULTURE

## 2017-04-23 LAB — CBC WITH DIFFERENTIAL/PLATELET
BASOS ABS: 0 10*3/uL (ref 0.0–0.1)
Basophils Relative: 0 %
EOS PCT: 2 %
Eosinophils Absolute: 0.2 10*3/uL (ref 0.0–0.7)
HEMATOCRIT: 28 % — AB (ref 36.0–46.0)
Hemoglobin: 9.3 g/dL — ABNORMAL LOW (ref 12.0–15.0)
LYMPHS ABS: 1.5 10*3/uL (ref 0.7–4.0)
Lymphocytes Relative: 21 %
MCH: 30.9 pg (ref 26.0–34.0)
MCHC: 33.2 g/dL (ref 30.0–36.0)
MCV: 93 fL (ref 78.0–100.0)
MONO ABS: 1 10*3/uL (ref 0.1–1.0)
Monocytes Relative: 14 %
NEUTROS ABS: 4.4 10*3/uL (ref 1.7–7.7)
Neutrophils Relative %: 62 %
PLATELETS: 118 10*3/uL — AB (ref 150–400)
RBC: 3.01 MIL/uL — AB (ref 3.87–5.11)
RDW: 14.3 % (ref 11.5–15.5)
WBC: 7.1 10*3/uL (ref 4.0–10.5)

## 2017-04-23 LAB — VITAMIN D 25 HYDROXY (VIT D DEFICIENCY, FRACTURES): Vit D, 25-Hydroxy: 31.6 ng/mL (ref 30.0–100.0)

## 2017-04-23 LAB — GLUCOSE, CAPILLARY
GLUCOSE-CAPILLARY: 158 mg/dL — AB (ref 65–99)
GLUCOSE-CAPILLARY: 173 mg/dL — AB (ref 65–99)
Glucose-Capillary: 99 mg/dL (ref 65–99)
Glucose-Capillary: 158 mg/dL — ABNORMAL HIGH (ref 65–99)

## 2017-04-23 LAB — PREPARE RBC (CROSSMATCH)

## 2017-04-23 LAB — BASIC METABOLIC PANEL
ANION GAP: 7 (ref 5–15)
BUN: 10 mg/dL (ref 6–20)
CO2: 26 mmol/L (ref 22–32)
Calcium: 8.3 mg/dL — ABNORMAL LOW (ref 8.9–10.3)
Chloride: 104 mmol/L (ref 101–111)
Creatinine, Ser: 0.57 mg/dL (ref 0.44–1.00)
GFR calc Af Amer: >60 mL/min (ref >60)
GLUCOSE: 115 mg/dL — AB (ref 65–99)
POTASSIUM: 4.5 mmol/L (ref 3.5–5.1)
Sodium: 137 mmol/L (ref 135–145)

## 2017-04-23 LAB — PROTIME-INR
INR: 1.13
Prothrombin Time: 14.5 seconds (ref 11.4–15.2)

## 2017-04-23 LAB — CALCITRIOL (1,25 DI-OH VIT D): VIT D 1 25 DIHYDROXY: 25.9 pg/mL (ref 19.9–79.3)

## 2017-04-23 LAB — PTH, INTACT AND CALCIUM
Calcium, Total (PTH): 8.4 mg/dL — ABNORMAL LOW (ref 8.7–10.3)
PTH: 16 pg/mL (ref 15–65)

## 2017-04-23 LAB — CALCIUM, IONIZED: Calcium, Ionized, Serum: 4.8 mg/dL (ref 4.5–5.6)

## 2017-04-23 LAB — APTT: APTT: 33 s (ref 24–36)

## 2017-04-23 SURGERY — OPEN REDUCTION INTERNAL FIXATION (ORIF) DISTAL FEMUR FRACTURE
Anesthesia: General | Laterality: Left

## 2017-04-23 MED ORDER — SODIUM CHLORIDE 0.9 % IV SOLN
Freq: Once | INTRAVENOUS | Status: AC
  Administered 2017-04-23: 11:00:00 via INTRAVENOUS

## 2017-04-23 MED ORDER — FENTANYL CITRATE (PF) 100 MCG/2ML IJ SOLN
25.0000 ug | INTRAMUSCULAR | Status: DC | PRN
Start: 2017-04-23 — End: 2017-04-23
  Administered 2017-04-23: 50 ug via INTRAVENOUS

## 2017-04-23 MED ORDER — LACTATED RINGERS IV SOLN
INTRAVENOUS | Status: DC
  Administered 2017-04-23: 08:00:00 via INTRAVENOUS

## 2017-04-23 MED ORDER — PHENYLEPHRINE HCL 10 MG/ML IJ SOLN
INTRAVENOUS | Status: DC | PRN
  Administered 2017-04-23: 30 ug/min via INTRAVENOUS

## 2017-04-23 MED ORDER — ALBUMIN HUMAN 5 % IV SOLN
INTRAVENOUS | Status: DC | PRN
  Administered 2017-04-23: 11:00:00 via INTRAVENOUS

## 2017-04-23 MED ORDER — DEXAMETHASONE SODIUM PHOSPHATE 10 MG/ML IJ SOLN
INTRAMUSCULAR | Status: AC
  Filled 2017-04-23: qty 1

## 2017-04-23 MED ORDER — ONDANSETRON HCL 4 MG/2ML IJ SOLN
INTRAMUSCULAR | Status: AC
  Filled 2017-04-23: qty 2

## 2017-04-23 MED ORDER — ROCURONIUM BROMIDE 10 MG/ML (PF) SYRINGE
PREFILLED_SYRINGE | INTRAVENOUS | Status: AC
  Filled 2017-04-23: qty 5

## 2017-04-23 MED ORDER — METOCLOPRAMIDE HCL 5 MG PO TABS: 5.0000 mg | ORAL_TABLET | Freq: Three times a day (TID) | ORAL | Status: DC | PRN

## 2017-04-23 MED ORDER — LIDOCAINE 2% (20 MG/ML) 5 ML SYRINGE
INTRAMUSCULAR | Status: AC
  Filled 2017-04-23: qty 5

## 2017-04-23 MED ORDER — METOCLOPRAMIDE HCL 5 MG/ML IJ SOLN
5.0000 mg | Freq: Three times a day (TID) | INTRAMUSCULAR | Status: DC | PRN
  Administered 2017-04-23: 10 mg via INTRAVENOUS
  Filled 2017-04-23: qty 2

## 2017-04-23 MED ORDER — PROPOFOL 10 MG/ML IV BOLUS
INTRAVENOUS | Status: DC | PRN
  Administered 2017-04-23: 70 mg via INTRAVENOUS

## 2017-04-23 MED ORDER — LIDOCAINE 2% (20 MG/ML) 5 ML SYRINGE
INTRAMUSCULAR | Status: DC | PRN
  Administered 2017-04-23: 70 mg via INTRAVENOUS

## 2017-04-23 MED ORDER — FENTANYL CITRATE (PF) 100 MCG/2ML IJ SOLN
INTRAMUSCULAR | Status: DC | PRN
  Administered 2017-04-23 (×2): 50 ug via INTRAVENOUS
  Administered 2017-04-23: 25 ug via INTRAVENOUS
  Administered 2017-04-23: 50 ug via INTRAVENOUS

## 2017-04-23 MED ORDER — PROPOFOL 10 MG/ML IV BOLUS
INTRAVENOUS | Status: AC
  Filled 2017-04-23: qty 20

## 2017-04-23 MED ORDER — ONDANSETRON HCL 4 MG/2ML IJ SOLN
4.0000 mg | Freq: Four times a day (QID) | INTRAMUSCULAR | Status: DC | PRN
  Administered 2017-04-23: 4 mg via INTRAVENOUS
  Filled 2017-04-23: qty 2

## 2017-04-23 MED ORDER — SUGAMMADEX SODIUM 200 MG/2ML IV SOLN
INTRAVENOUS | Status: DC | PRN
  Administered 2017-04-23: 200 mg via INTRAVENOUS

## 2017-04-23 MED ORDER — MIDAZOLAM HCL 2 MG/2ML IJ SOLN
INTRAMUSCULAR | Status: AC
  Filled 2017-04-23: qty 2

## 2017-04-23 MED ORDER — VANCOMYCIN HCL IN DEXTROSE 1-5 GM/200ML-% IV SOLN
1000.0000 mg | INTRAVENOUS | Status: DC
  Filled 2017-04-23: qty 200

## 2017-04-23 MED ORDER — PHENYLEPHRINE 40 MCG/ML (10ML) SYRINGE FOR IV PUSH (FOR BLOOD PRESSURE SUPPORT)
PREFILLED_SYRINGE | INTRAVENOUS | Status: AC
  Filled 2017-04-23: qty 10

## 2017-04-23 MED ORDER — FENTANYL CITRATE (PF) 100 MCG/2ML IJ SOLN
INTRAMUSCULAR | Status: AC
Start: 2017-04-23 — End: 2017-04-24
  Filled 2017-04-23: qty 2

## 2017-04-23 MED ORDER — FENTANYL CITRATE (PF) 250 MCG/5ML IJ SOLN
INTRAMUSCULAR | Status: AC
  Filled 2017-04-23: qty 5

## 2017-04-23 MED ORDER — ACETAMINOPHEN 325 MG PO TABS
650.0000 mg | ORAL_TABLET | Freq: Four times a day (QID) | ORAL | Status: DC | PRN
  Administered 2017-04-25 (×2): 650 mg via ORAL
  Filled 2017-04-23 (×2): qty 2

## 2017-04-23 MED ORDER — VANCOMYCIN HCL 1000 MG IV SOLR
INTRAVENOUS | Status: DC | PRN
  Administered 2017-04-23: 1000 mg via INTRAVENOUS

## 2017-04-23 MED ORDER — LACTATED RINGERS IV SOLN
INTRAVENOUS | Status: DC | PRN
  Administered 2017-04-23: 07:00:00 via INTRAVENOUS

## 2017-04-23 MED ORDER — SUCCINYLCHOLINE CHLORIDE 200 MG/10ML IV SOSY
PREFILLED_SYRINGE | INTRAVENOUS | Status: AC
  Filled 2017-04-23: qty 10

## 2017-04-23 MED ORDER — DEXAMETHASONE SODIUM PHOSPHATE 10 MG/ML IJ SOLN
INTRAMUSCULAR | Status: DC | PRN
  Administered 2017-04-23: 8 mg via INTRAVENOUS

## 2017-04-23 MED ORDER — ONDANSETRON HCL 4 MG/2ML IJ SOLN
INTRAMUSCULAR | Status: DC | PRN
  Administered 2017-04-23: 4 mg via INTRAVENOUS

## 2017-04-23 MED ORDER — ONDANSETRON HCL 4 MG PO TABS: 4.0000 mg | ORAL_TABLET | Freq: Four times a day (QID) | ORAL | Status: DC | PRN

## 2017-04-23 MED ORDER — PHENYLEPHRINE 40 MCG/ML (10ML) SYRINGE FOR IV PUSH (FOR BLOOD PRESSURE SUPPORT)
PREFILLED_SYRINGE | INTRAVENOUS | Status: DC | PRN
  Administered 2017-04-23 (×3): 80 ug via INTRAVENOUS

## 2017-04-23 MED ORDER — ROCURONIUM BROMIDE 100 MG/10ML IV SOLN
INTRAVENOUS | Status: DC | PRN
  Administered 2017-04-23 (×2): 10 mg via INTRAVENOUS
  Administered 2017-04-23: 40 mg via INTRAVENOUS
  Administered 2017-04-23 (×4): 10 mg via INTRAVENOUS

## 2017-04-23 MED ORDER — SODIUM CHLORIDE 0.9 % IV SOLN: 10.0000 mL/h | Freq: Once | INTRAVENOUS | Status: DC

## 2017-04-23 MED ORDER — EPHEDRINE 5 MG/ML INJ
INTRAVENOUS | Status: AC
  Filled 2017-04-23: qty 10

## 2017-04-23 MED ORDER — MIDAZOLAM HCL 5 MG/5ML IJ SOLN
INTRAMUSCULAR | Status: DC | PRN
  Administered 2017-04-23: 2 mg via INTRAVENOUS

## 2017-04-23 MED ORDER — ACETAMINOPHEN 650 MG RE SUPP: 650.0000 mg | Freq: Four times a day (QID) | RECTAL | Status: DC | PRN

## 2017-04-23 MED ORDER — SUGAMMADEX SODIUM 200 MG/2ML IV SOLN
INTRAVENOUS | Status: AC
  Filled 2017-04-23: qty 2

## 2017-04-23 MED ORDER — PHENOL 1.4 % MT LIQD: 1.0000 | OROMUCOSAL | Status: DC | PRN

## 2017-04-23 MED ORDER — MENTHOL 3 MG MT LOZG: 1.0000 | LOZENGE | OROMUCOSAL | Status: DC | PRN

## 2017-04-23 SURGICAL SUPPLY — 83 items
BANDAGE ACE 4X5 VEL STRL LF (GAUZE/BANDAGES/DRESSINGS) ×1 IMPLANT
BANDAGE ACE 6X5 VEL STRL LF (GAUZE/BANDAGES/DRESSINGS) ×1 IMPLANT
BIT DRILL 2.5 NCB (BIT) ×1 IMPLANT
BIT DRILL 2.5MM NCB (BIT) ×1
BIT DRILL 3.3MM (BIT) IMPLANT
BIT DRILL NCB FEM QC 4.3X245 (BIT) IMPLANT
BIT DRILL QC 3.3X195 (BIT) ×2 IMPLANT
BLADE CLIPPER SURG (BLADE) IMPLANT
BNDG GAUZE ELAST 4 BULKY (GAUZE/BANDAGES/DRESSINGS) ×1 IMPLANT
BRUSH SCRUB SURG 4.25 DISP (MISCELLANEOUS) ×6 IMPLANT
CABLE CERLAGE W/CRIMP 1.8 (Cable) ×5 IMPLANT
CABLE CERLAGE W/CRIMP 1.8MM (Cable) ×5 IMPLANT
CANISTER SUCT 3000ML PPV (MISCELLANEOUS) ×3 IMPLANT
CAP LOCK NCB (Cap) ×16 IMPLANT
COVER SURGICAL LIGHT HANDLE (MISCELLANEOUS) ×3 IMPLANT
DRAPE C-ARM 42X72 X-RAY (DRAPES) ×3 IMPLANT
DRAPE C-ARMOR (DRAPES) ×3 IMPLANT
DRAPE IMP U-DRAPE 54X76 (DRAPES) ×3 IMPLANT
DRAPE ORTHO SPLIT 77X108 STRL (DRAPES) ×6
DRAPE SURG ORHT 6 SPLT 77X108 (DRAPES) ×3 IMPLANT
DRAPE U-SHAPE 47X51 STRL (DRAPES) ×3 IMPLANT
DRILL 3.3MM (BIT) ×3
DRILL 4.3MM (BIT) ×3
DRSG ADAPTIC 3X8 NADH LF (GAUZE/BANDAGES/DRESSINGS) ×1 IMPLANT
DRSG MEPILEX BORDER 4X12 (GAUZE/BANDAGES/DRESSINGS) ×2 IMPLANT
DRSG MEPILEX BORDER 4X4 (GAUZE/BANDAGES/DRESSINGS) ×2 IMPLANT
DRSG PAD ABDOMINAL 8X10 ST (GAUZE/BANDAGES/DRESSINGS) ×4 IMPLANT
ELECT REM PT RETURN 9FT ADLT (ELECTROSURGICAL) ×3
ELECTRODE REM PT RTRN 9FT ADLT (ELECTROSURGICAL) ×1 IMPLANT
EVACUATOR 1/8 PVC DRAIN (DRAIN) IMPLANT
EVACUATOR 3/16  PVC DRAIN (DRAIN)
EVACUATOR 3/16 PVC DRAIN (DRAIN) IMPLANT
GAUZE SPONGE 4X4 12PLY STRL (GAUZE/BANDAGES/DRESSINGS) ×1 IMPLANT
GLOVE BIO SURGEON STRL SZ7.5 (GLOVE) ×3 IMPLANT
GLOVE BIO SURGEON STRL SZ8 (GLOVE) ×3 IMPLANT
GLOVE BIOGEL PI IND STRL 7.5 (GLOVE) ×1 IMPLANT
GLOVE BIOGEL PI IND STRL 8 (GLOVE) ×1 IMPLANT
GLOVE BIOGEL PI INDICATOR 7.5 (GLOVE) ×2
GLOVE BIOGEL PI INDICATOR 8 (GLOVE) ×2
GOWN STRL REUS W/ TWL LRG LVL3 (GOWN DISPOSABLE) ×2 IMPLANT
GOWN STRL REUS W/ TWL XL LVL3 (GOWN DISPOSABLE) ×1 IMPLANT
GOWN STRL REUS W/TWL LRG LVL3 (GOWN DISPOSABLE) ×9
GOWN STRL REUS W/TWL XL LVL3 (GOWN DISPOSABLE) ×3
K-WIRE 2.0 (WIRE) ×6
K-WIRE FXSTD 280X2XNS SS (WIRE) ×2
KIT BASIN OR (CUSTOM PROCEDURE TRAY) ×3 IMPLANT
KIT ROOM TURNOVER OR (KITS) ×3 IMPLANT
KWIRE FXSTD 280X2XNS SS (WIRE) IMPLANT
LOCKPLATE CABLE BUTTON NCP HIP (Orthopedic Implant) ×8 IMPLANT
NEEDLE 22X1 1/2 (OR ONLY) (NEEDLE) IMPLANT
NS IRRIG 1000ML POUR BTL (IV SOLUTION) ×3 IMPLANT
PACK TOTAL JOINT (CUSTOM PROCEDURE TRAY) ×3 IMPLANT
PACK UNIVERSAL I (CUSTOM PROCEDURE TRAY) ×1 IMPLANT
PAD ARMBOARD 7.5X6 YLW CONV (MISCELLANEOUS) ×4 IMPLANT
PAD CAST 4YDX4 CTTN HI CHSV (CAST SUPPLIES) ×1 IMPLANT
PADDING CAST COTTON 4X4 STRL (CAST SUPPLIES) ×3
PADDING CAST COTTON 6X4 STRL (CAST SUPPLIES) ×3 IMPLANT
PLATE DIST FEM 12H (Plate) ×2 IMPLANT
SCREW 5.0 60MM (Screw) ×2 IMPLANT
SCREW 5.0 70MM (Screw) ×6 IMPLANT
SCREW 5.0 80MM (Screw) ×2 IMPLANT
SCREW CANN NCB PA 6.2X4.4/5 (Screw) ×4 IMPLANT
SCREW CORT NCB SELFTAP 5.0X50 (Screw) ×2 IMPLANT
SCREW UNI 5.0 12MM (Screw) ×2 IMPLANT
SPONGE LAP 18X18 X RAY DECT (DISPOSABLE) ×1 IMPLANT
STAPLER VISISTAT 35W (STAPLE) ×3 IMPLANT
SUCTION FRAZIER HANDLE 10FR (MISCELLANEOUS)
SUCTION TUBE FRAZIER 10FR DISP (MISCELLANEOUS) ×1 IMPLANT
SUT ETHILON 2 0 PSLX (SUTURE) ×2 IMPLANT
SUT PROLENE 0 CT 2 (SUTURE) IMPLANT
SUT VIC AB 0 CT1 27 (SUTURE) ×3
SUT VIC AB 0 CT1 27XBRD ANBCTR (SUTURE) ×2 IMPLANT
SUT VIC AB 1 CT1 18XCR BRD 8 (SUTURE) IMPLANT
SUT VIC AB 1 CT1 27 (SUTURE) ×6
SUT VIC AB 1 CT1 27XBRD ANBCTR (SUTURE) ×2 IMPLANT
SUT VIC AB 1 CT1 8-18 (SUTURE) ×3
SUT VIC AB 2-0 CT1 27 (SUTURE) ×6
SUT VIC AB 2-0 CT1 TAPERPNT 27 (SUTURE) ×2 IMPLANT
SYR 20ML ECCENTRIC (SYRINGE) IMPLANT
TOWEL OR 17X24 6PK STRL BLUE (TOWEL DISPOSABLE) ×1 IMPLANT
TOWEL OR 17X26 10 PK STRL BLUE (TOWEL DISPOSABLE) ×8 IMPLANT
TRAY FOLEY W/METER SILVER 16FR (SET/KITS/TRAYS/PACK) IMPLANT
WATER STERILE IRR 1000ML POUR (IV SOLUTION) ×2 IMPLANT

## 2017-04-23 NOTE — Anesthesia Postprocedure Evaluation (Signed)
Anesthesia Post Note  Patient: [name]  Procedure(s) Performed: Procedure(s) (LRB): OPEN REDUCTION INTERNAL FIXATION (ORIF) DISTAL FEMUR FRACTURE (FRACTURE AROUND FEMORAL NAIL) (Left)     Patient location during evaluation: PACU Anesthesia Type: General Level of consciousness: awake and sedated Pain management: pain level controlled Vital Signs Assessment: post-procedure vital signs reviewed and stable Respiratory status: spontaneous breathing, nonlabored ventilation, respiratory function stable and patient connected to nasal cannula oxygen Cardiovascular status: blood pressure returned to baseline and stable Postop Assessment: no signs of nausea or vomiting Anesthetic complications: no    Last Vitals:  Vitals:   04/23/17 0643 04/23/17 1258  BP: (!) 119/44   Pulse: 73   Resp: 16   Temp: 36.9 C 36.8 C  SpO2: 99%     Last Pain:  Vitals:   04/23/17 1328  TempSrc:   PainSc: Asleep                 Shanyce Daris,JAMES TERRILL

## 2017-04-23 NOTE — Anesthesia Preprocedure Evaluation (Addendum)
Anesthesia Evaluation  Patient identified by MRN, date of birth, ID band Patient awake    Reviewed: Allergy & Precautions, NPO status   Airway Mallampati: II  TM Distance: >3 FB Neck ROM: Full    Dental  (+) Teeth Intact   Pulmonary former smoker,    breath sounds clear to auscultation       Cardiovascular hypertension, + CAD, + Past MI and + Peripheral Vascular Disease   Rhythm:Regular Rate:Normal     Neuro/Psych Seizures -, Well Controlled,  TIA   GI/Hepatic hiatal hernia, GERD  ,(+) Cirrhosis       ,   Endo/Other  diabetes  Renal/GU      Musculoskeletal   Abdominal   Peds  Hematology  (+) anemia ,   Anesthesia Other Findings   Reproductive/Obstetrics                            Anesthesia Physical Anesthesia Plan  ASA: IV  Anesthesia Plan: General   Post-op Pain Management:    Induction: Intravenous  PONV Risk Score and Plan: 2 and Ondansetron and Dexamethasone  Airway Management Planned: Oral ETT  Additional Equipment:   Intra-op Plan:   Post-operative Plan: Extubation in OR  Informed Consent: I have reviewed the patients History and Physical, chart, labs and discussed the procedure including the risks, benefits and alternatives for the proposed anesthesia with the patient or authorized representative who has indicated his/her understanding and acceptance.     Plan Discussed with: CRNA  Anesthesia Plan Comments:         Anesthesia Quick Evaluation

## 2017-04-23 NOTE — Progress Notes (Signed)
PROGRESS NOTE   [name]  TTS:[id number]    DOB: 1940/10/19    DOA: 04/21/2017  PCP: Redmond School, MD    Brief Narrative:  76 year old female with PMH of anemia, mild-moderate AS & AI, bilateral carotid stenosis, cirrhosis due to NASH with esophageal varices & banding, CAD status post remote stent, cardiac cath 09/2016 showed only mild atherosclerosis/nonobstructive disease, PSVT, HTN, GERD, stroke, seizures, HLD, OSA, type II DM, s/p left femur IM nail 03/02/17 following which she was undergoing rehabilitation at [facility] in [place] and apparently doing well with ambulation and activities, presented to ED on 04/21/17 after a mechanical fall and sustained a left femur fracture. Orthopedic consulted, unfortunately patient had already eaten breakfast on 8/14 and hence surgery scheduled for 8/16.  Assessment & Plan:   Principal Problem:   Closed left femoral fracture (HCC) Active Problems:   DM type 2 (diabetes mellitus, type 2) (HCC)   OSA (obstructive sleep apnea)   Aortic insufficiency   HTN (hypertension)   Lower urinary tract infectious disease   Seizure disorder ([place])   1. Complex left femur peri-implant fracture: Secondary to mechanical fall at SNF. S/p ORIF 2. Diet-controlled DM 2: Diet controlled. Continue to monitor blood sugars.  3. Anemia: Stable. Continue ferrous sulfate. 4. Thrombocytopenia: Stable. Related to cirrhosis. No bleeding reported. Follow CBCs. 5. Seizure disorder: Continue Dilantin. No recent seizures reported.  6. OSA: Not on CPAP. 7. Essential hypertension: Stable, Continue carvedilol. 8. Acute cystitis: Continue IV ceftriaxone pending urine culture results. 9. PSVT: As per outpatient cardiology notes, previous monitor showed infrequent PACs but clinically from notes suspected PSVT that was not captured on monitor. No recent palpitations reported. Continue carvedilol. 10. CAD, history of MI in 1995, status post 2 stents to RCA: Asymptomatic of  exertional dyspnea or chest pain. Cardiac cath January 2018 showed nonobstructive CAD. Echo 10/2016: LVEF 65-70 percent. Continue aspirin and carvedilol. 11. Carotid stenosis: Continue aspirin. 12. NASH Cirrhosis complicated by esophageal varices & banding, thrombocytopenia: No bleeding reported. INR in June was 1.28. Continue folate. 13. GERD: stable contue, PPI.   DVT prophylaxis: Subcutaneous heparin  Code Status: Full Family Communication: None at bedside Disposition: Return to SNF post surgery   Consultants:  Orthopedics   Procedures:  Foley catheter  Antimicrobials:  IV ceftriaxone    Subjective: No acute issues reported to me today.   Objective:  Vitals:   04/23/17 1415 04/23/17 1430 04/23/17 1458 04/23/17 1530  BP: (!) 120/42 (!) 122/41  (!) 134/42  Pulse: 76 72  77  Resp: 12 12  13   Temp:   97.7 F (36.5 C) 98.2 F (36.8 C)  TempSrc:    Oral  SpO2: 94% 97%  97%  Weight:      Height:        Examination:Exam unchanged when compared to 04/22/2017  General exam: Pt in nad, alert and awake Respiratory system: Clear to auscultation. Respiratory effort normal. Equal chest rise. Cardiovascular system: S1 & S2 heard, RRR. No JVD, murmurs, rubs, gallops or clicks. No pedal edema. Gastrointestinal system: Abdomen is nondistended, soft and nontender. No organomegaly or masses felt. Normal bowel sounds heard. Central nervous system: Alert and oriented. No focal neurological deficits. Extremities: Symmetric 5 x 5 power except left lower extremity where evaluation is limited due to pain. Swelling, increased warmth and tenderness of distal left thigh and knee.  Skin: No rashes, lesions or ulcers, on limited exam. Psychiatry: Judgement and insight appear normal. Mood & affect appropriate.  Data Reviewed: I have personally reviewed following labs and imaging studies  CBC:  Recent Labs Lab 04/21/17 0103 04/21/17 0621 04/22/17 0514 04/23/17 0305  WBC 9.9 8.0  6.1 7.1  NEUTROABS 7.5  --  4.0 4.4  HGB 10.7* 10.6* 9.8* 9.3*  HCT 31.2* 31.8* 30.2* 28.0*  MCV 92.3 91.9 93.2 93.0  PLT 129* 137* 115* 967*   Basic Metabolic Panel:  Recent Labs Lab 04/21/17 0103 04/21/17 0621 04/22/17 0514 04/23/17 0305  NA 134* 135 141 137  K 4.3 3.9 4.7 4.5  CL 104 103 108 104  CO2 23 24 27 26   GLUCOSE 139* 115* 104* 115*  BUN 15 13 10 10   CREATININE 0.53 0.58 0.58 0.57  CALCIUM 8.5* 8.2* 8.4*  8.4* 8.3*  MG  --   --  1.7  --   PHOS  --   --  4.8*  --    Liver Function Tests:  Recent Labs Lab 04/21/17 0621 04/22/17 0514  AST 34 23  ALT 38 30  ALKPHOS 241* 226*  BILITOT 1.1 0.8  PROT 4.6* 4.6*  ALBUMIN 2.5* 2.4*   Coagulation Profile:  Recent Labs Lab 04/22/17 0514 04/23/17 0305  INR 1.24 1.13   Cardiac Enzymes: No results for input(s): CKTOTAL, CKMB, CKMBINDEX, TROPONINI in the last 168 hours. HbA1C: No results for input(s): HGBA1C in the last 72 hours. CBG:  Recent Labs Lab 04/22/17 1627 04/22/17 2301 04/23/17 0637 04/23/17 1304 04/23/17 1628  GLUCAP 139* 97 99 158* 158*    Recent Results (from the past 240 hour(s))  Urine culture     Status: Abnormal   Collection Time: 04/21/17  2:00 AM  Result Value Ref Range Status   Specimen Description URINE, CATHETERIZED  Final   Special Requests NONE  Final   Culture >=100,000 COLONIES/mL ESCHERICHIA COLI (A)  Final   Report Status 04/23/2017 FINAL  Final   Organism ID, Bacteria ESCHERICHIA COLI (A)  Final      Susceptibility   Escherichia coli - MIC*    AMPICILLIN >=32 RESISTANT Resistant     CEFAZOLIN 16 SENSITIVE Sensitive     CEFTRIAXONE <=1 SENSITIVE Sensitive     CIPROFLOXACIN <=0.25 SENSITIVE Sensitive     GENTAMICIN <=1 SENSITIVE Sensitive     IMIPENEM <=0.25 SENSITIVE Sensitive     NITROFURANTOIN <=16 SENSITIVE Sensitive     TRIMETH/SULFA <=20 SENSITIVE Sensitive     AMPICILLIN/SULBACTAM 16 INTERMEDIATE Intermediate     PIP/TAZO <=4 SENSITIVE Sensitive      Extended ESBL NEGATIVE Sensitive     * >=100,000 COLONIES/mL ESCHERICHIA COLI  Surgical pcr screen     Status: Abnormal   Collection Time: 04/21/17  8:01 AM  Result Value Ref Range Status   MRSA, PCR POSITIVE (A) NEGATIVE Final    Comment: RESULT CALLED TO, READ BACK BY AND VERIFIED WITH: Michele Rockers RN, AT 1021 04/21/17 BY D. VANHOOK    Staphylococcus aureus POSITIVE (A) NEGATIVE Final    Comment:        The Xpert SA Assay (FDA approved for NASAL specimens in patients over 23 years of age), is one component of a comprehensive surveillance program.  Test performance has been validated by [facility] for patients greater than or equal to 19 year old. It is not intended to diagnose infection nor to guide or monitor treatment.          Radiology Studies: Dg C-arm 61-120 Min  Result Date: 04/23/2017 CLINICAL DATA:  Elective surgery. EXAM: LEFT FEMUR  2 VIEWS; DG C-ARM 61-120 MIN COMPARISON:  No prior. FINDINGS: ORIF left femur. Hardware intact. Anatomic alignment. No acute bony abnormality . IMPRESSION: ORIF left femur.  Anatomic alignment. Electronically Signed   By: Marcello Moores  Register   On: 04/23/2017 13:09   Dg Femur Min 2 Views Left  Result Date: 04/23/2017 CLINICAL DATA:  Elective surgery. EXAM: LEFT FEMUR 2 VIEWS; DG C-ARM 61-120 MIN COMPARISON:  No prior. FINDINGS: ORIF left femur. Hardware intact. Anatomic alignment. No acute bony abnormality . IMPRESSION: ORIF left femur.  Anatomic alignment. Electronically Signed   By: Marcello Moores  Register   On: 04/23/2017 13:09   Dg Femur Port Min 2 Views Left  Result Date: 04/23/2017 CLINICAL DATA:  Open reduction internal fixation. EXAM: LEFT FEMUR PORTABLE 2 VIEWS COMPARISON:  04/21/2017 . FINDINGS: ORIF left femur. Hardware intact. Anatomic alignment. Bony fragments noted over the suprapatellar space. Peripheral vascular calcification . IMPRESSION: 1. ORIF left femur. Anatomic alignment. Bony fragments noted over the suprapatellar space. 2.  Peripheral vascular disease. Electronically Signed   By: [place]   On: 04/23/2017 14:24        Scheduled Meds: . aspirin  324 mg Oral QHS  . carvedilol  12.5 mg Oral BID WC  . Chlorhexidine Gluconate Cloth  6 each Topical Q0600  . docusate sodium  100 mg Oral Daily  . feeding supplement (GLUCERNA SHAKE)  237 mL Oral BID WC  . fentaNYL      . ferrous sulfate  325 mg Oral Q breakfast  . folic acid  1 mg Oral Daily  . heparin  5,000 Units Subcutaneous Q8H  . mupirocin ointment  1 application Nasal BID  . pantoprazole  40 mg Oral Daily  . phenytoin  100 mg Oral BID  . potassium chloride SA  20 mEq Oral Daily   Continuous Infusions: . sodium chloride    . cefTRIAXone (ROCEPHIN)  IV Stopped (04/22/17 9201)  . lactated ringers 10 mL/hr at 04/23/17 0734     LOS: 2 days     Velvet Bathe, MD, FACP, [facility] Pager [phone]  If 7PM-7AM, please contact night-coverage [email or web address] Password [facility] 04/23/2017, 5:45 PM

## 2017-04-23 NOTE — Anesthesia Procedure Notes (Addendum)
Procedure Name: Intubation Date/Time: 04/23/2017 8:24 AM Performed by: Orlie Dakin Pre-anesthesia Checklist: Patient identified, Emergency Drugs available, Suction available, Patient being monitored and Timeout performed Patient Re-evaluated:Patient Re-evaluated prior to induction Oxygen Delivery Method: Circle system utilized Preoxygenation: Pre-oxygenation with 100% oxygen Induction Type: IV induction Ventilation: Mask ventilation without difficulty Laryngoscope Size: Miller and 3 Grade View: Grade II Tube type: Oral Tube size: 7.5 mm Number of attempts: 1 Airway Equipment and Method: Stylet Placement Confirmation: ETT inserted through vocal cords under direct vision,  positive ETCO2 and breath sounds checked- equal and bilateral Secured at: 23 cm Tube secured with: Tape Dental Injury: Teeth and Oropharynx as per pre-operative assessment  Comments: 4x4s bite block used.

## 2017-04-23 NOTE — Op Note (Signed)
NAMEZENNA, TRAISTER NO.:  [id number]  MEDICAL RECORD NO.:  [id number]  LOCATION:  WA06                         FACILITY:  [facility]  PHYSICIAN:  Astrid Divine. Marcelino Scot, M.D. DATE OF BIRTH:  [birth year]  DATE OF PROCEDURE:  04/23/2017 DATE OF DISCHARGE:                              OPERATIVE REPORT   PREOPERATIVE DIAGNOSES:  Left supracondylar femur and left femoral shaft fractures, peri-implant (fracture around femoral nail).  POSTOPERATIVE DIAGNOSES:  Left supracondylar femur and left femoral shaft fractures, peri-implant (fracture around femoral nail).  PROCEDURE: 1. Open reduction and internal fixation of left supracondylar femur     fracture. 2. Open reduction and internal fixation of left femoral shaft     fracture. 3. Removal of distal locking bolt.  SURGEON:  Astrid Divine. Marcelino Scot, M.D.  ASSISTANT:  Ainsley Spinner, PA-C.  ANESTHESIA:  General.  COMPLICATIONS:  None.  I/O:  Two units of packed red blood cells with another en route 250 mL albumin, 700 mL LR, 250 mL saline/out 1000 mL EBL, 620 mL UOP.  SPECIMENS:  None.  DISPOSITION:  To PACU.  CONDITION:  Hemodynamically, stable.  BRIEF SUMMARY OF INDICATION FOR PROCEDURE:  [name] is a 76 year old female, who is 6 weeks out from IM nailing, who felt her left leg and femur gave away when she struck the doorjamb resulting in fall and fracture of the femur with displacement and loss of fixation of the distal screw.  She was initially seen and evaluated by the on-call surgeon, who recognized the complexity of the fracture, which was highly comminuted and the nail completely displaced through the anterior cortex.  There was also suggestion of possible shaft involvement and comminution there.  Consequently, Dr. Stann Mainland asserted this was outside his scope of practice and be in best interest of the patient to have these injuries evaluated and managed by fellowship trained orthopedic traumatologist.  Consequently,  we saw and consulted on the patient, recommended internal fixation with probable bridge plating.  Risks discussed included heart attack, stroke, death, transfusion, need for further surgery, malunion, nonunion, DVT, PE, and others.  She did wish to proceed.  BRIEF SUMMARY OF PROCEDURE:  The patient was taken to the operating room where general anesthesia was induced.  She did receive preoperative antibiotics, which included vancomycin.  The left lower extremity was prepped and draped in usual sterile fashion.  There was a bump placed under the hip.  C-arm was brought in to evaluate for the size of the plate and then, a lateral incision and approach made after time-out. Dissection was carried down to the tensor, which was split in line with the skin incision.  The patient would have normally been treated for the fracture with subcutaneous plating and opening it distally; however, because of the locking bolt, I made the incision larger initially and retracted the vastus lateralis anteriorly.  Screw was removed, but the nail could not be reduced well or sufficiently.  My assistant pulled traction, variety of reduction maneuvers, but these were not initially successful.  I extended the incision proximally where the primary fracture site to the supracondylar fracture exited and was able to clean this hematoma and place a  clamp carefully on the shaft and then use this to control the proximal segment and thereby reduce the nail and supracondylar component distally.  I brought in the C-arm and could appreciate some subtle changes through the metaphysis and shaft proximally.  I supplemented the fixation with a Verbrugge clamp and at that point, I turned my attention more proximal to the shaft component, which consisted of multiple fragments with longitudinal splits in the bone.  The plate was unable to be an applied with the clamps and the fixation could not be held adequately at both fracture  sites simultaneously.  Consequently, we began with reconstruction of the more proximal shaft segment passing a Satinsky clamp around the bone using a small gauge wire to cerclage the shaft component together.  Then, we went a little more distally and performed this in similar fashion.  Once this was achieved, I was able to remove the clamps and then placed the 12 hole Biomet Zimmer lateral plate.  The guide was used proximally.  We doubted into the center of the bone on the lateral and then in the center of the supracondylar segment distally.  Standard screws were placed into the supracondylar component and articular block.  This was then supplemented with passage of a cerclage cable proximal to the supracondylar fracture within the middle segment of the femur. Proximally unicortical screws were placed in the lateral cortex just below the lag screw for the old intramedullary hip nail.  We achieved fixation with 2 screws and then turned attention distally where we applied an additional cable around the middle segment of the femur proximally.  We then added 2 cables to secure the femur proximal to the shaft fracture.  Final AP and lateral images showed appropriate reduction and hardware placement.  End caps were placed over the screws to convert them into fixed angle construct given her very poor bone quality.  Ainsley Spinner, PA-C, assisted me throughout.  An assistant was absolutely necessary for this very complex and technically demanding case for reduction, retraction, and instrumentation.  Wound was irrigated thoroughly, closed in standard layered fashion using a 0 Vicryl for the vastus lateralis proximally, a #1 Vicryl for the tensor, and then 2-0 nylon for the skin.  Sterile gently compressive dressing was applied.  The patient was taken to the PACU in stable condition.  PROGNOSIS:  [name] will have unrestricted range of motion of the knee and hip.  She will be nonweightbearing for  the next 8 weeks.  She will be allowed to begin general exercises at 4 weeks.  She has very poor bone quality, certainly at risk for further fractures or delayed healing, particularly of the anterior cortex given the rather severe comminution there.  The fracture was distal that I was unable to place bone graft anteriorly such that they would not dislodge or risk going into the joint.  She is also at risk for other perioperative complications given her comorbidities and blood loss.     Astrid Divine. Marcelino Scot, M.D.    MHH/MEDQ  D:  04/23/2017  T:  04/23/2017  Job:  [id number]

## 2017-04-23 NOTE — Addendum Note (Signed)
Addendum  created 04/23/17 1408 by Roderic Palau, MD   Order list changed, Order sets accessed

## 2017-04-23 NOTE — Brief Op Note (Signed)
04/21/2017 - 04/23/2017  12:48 PM  PATIENT:  [name]  77 y.o. female  PRE-OPERATIVE DIAGNOSIS:   1. LEFT SUPRACONDYLAR FEMUR PERI-IMPLANT FRACTURE 2. LEFT FEMORAL SHAFT FRACTURE  POST-OPERATIVE DIAGNOSIS:   1. LEFT SUPRACONDYLAR FEMUR PERI-IMPLANT FRACTURE 2. LEFT FEMORAL SHAFT FRACTURE  PROCEDURE:  Procedure(s): 1. OPEN REDUCTION INTERNAL FIXATION (ORIF) DISTAL FEMUR FRACTURE (FRACTURE AROUND FEMORAL NAIL) (Left) SUPRACONDYLAR 2. OPEN REDUCTION INTERNAL FIXATION (ORIF) MIDSHAFT FEMUR FRACTURE  3. REMOVAL OF DISTAL LOCKING SCREW  SURGEON:  Surgeon(s) and Role:    Altamese Sunset, MD - Primary  PHYSICIAN ASSISTANT: Ainsley Spinner, PA-C  ANESTHESIA:   general  EBL:  Total I/O In: 9480 [I.V.:850; Blood:315; IV Piggyback:250] Out: 1655 [Urine:620; Blood:1000]  BLOOD ADMINISTERED: 2 uPRBC  DRAINS: none   LOCAL MEDICATIONS USED:  NONE  SPECIMEN:  No Specimen  DISPOSITION OF SPECIMEN:  N/A  COUNTS:  YES  TOURNIQUET:  * No tourniquets in log *  DICTATION: .Other Dictation: Dictation Number [id number]  PLAN OF CARE: no  PATIENT DISPOSITION:  PACU - hemodynamically stable.   Delay start of Pharmacological VTE agent (>24hrs) due to surgical blood loss or risk of bleeding: no

## 2017-04-23 NOTE — Transfer of Care (Signed)
Immediate Anesthesia Transfer of Care Note  Patient: [name]  Procedure(s) Performed: Procedure(s): OPEN REDUCTION INTERNAL FIXATION (ORIF) DISTAL FEMUR FRACTURE (FRACTURE AROUND FEMORAL NAIL) (Left)  Patient Location: PACU  Anesthesia Type:General  Level of Consciousness: awake, alert  and patient cooperative  Airway & Oxygen Therapy: Patient Spontanous Breathing and Patient connected to face mask oxygen  Post-op Assessment: Report given to RN and Post -op Vital signs reviewed and stable  Post vital signs: Reviewed and stable  Last Vitals:  Vitals:   04/23/17 0643 04/23/17 1258  BP: (!) 119/44   Pulse: 73   Resp: 16   Temp: 36.9 C (P) 36.8 C  SpO2: 99%     Last Pain:  Vitals:   04/23/17 0643  TempSrc: Oral  PainSc:       Patients Stated Pain Goal: 3 (65/68/12 7517)  Complications: No apparent anesthesia complications

## 2017-04-23 NOTE — Consult Note (Signed)
   [facility] CM Inpatient Consult   04/23/2017  Latessa Tillis 07-04-1941 [id number]  Patient is currently active with [facility] for chronic disease management services prior to admission.  Patient has been engaged by a [facility] CSW.at a skilled facility [facility].  Our community based plan of care has focused on disease management and community resource support.  Will follow for disposition and needs as appropriate. Spoke   Inpatient Case Manager, Manuela Schwartz to make aware that [facility] following prior to admission . Of note, [facility] Care Management services does not replace or interfere with any services that are needed or arranged by inpatient case management or social work.  For additional questions or referrals please contact:  [name], RN BSN [facility] Liaison  [phone] business mobile phone Toll free office [phone]

## 2017-04-24 ENCOUNTER — Encounter (HOSPITAL_COMMUNITY): Payer: Self-pay | Admitting: Orthopedic Surgery

## 2017-04-24 LAB — CBC WITH DIFFERENTIAL/PLATELET
BASOS ABS: 0 10*3/uL (ref 0.0–0.1)
BASOS PCT: 0 %
EOS ABS: 0 10*3/uL (ref 0.0–0.7)
EOS PCT: 0 %
HCT: 27.7 % — ABNORMAL LOW (ref 36.0–46.0)
Hemoglobin: 9.4 g/dL — ABNORMAL LOW (ref 12.0–15.0)
Lymphocytes Relative: 15 %
Lymphs Abs: 1.8 10*3/uL (ref 0.7–4.0)
MCH: 30.6 pg (ref 26.0–34.0)
MCHC: 33.9 g/dL (ref 30.0–36.0)
MCV: 90.2 fL (ref 78.0–100.0)
MONO ABS: 1.8 10*3/uL — AB (ref 0.1–1.0)
Monocytes Relative: 15 %
Neutro Abs: 8.8 10*3/uL — ABNORMAL HIGH (ref 1.7–7.7)
Neutrophils Relative %: 70 %
PLATELETS: 142 10*3/uL — AB (ref 150–400)
RBC: 3.07 MIL/uL — AB (ref 3.87–5.11)
RDW: 15.5 % (ref 11.5–15.5)
WBC: 12.5 10*3/uL — AB (ref 4.0–10.5)

## 2017-04-24 LAB — GLUCOSE, CAPILLARY
GLUCOSE-CAPILLARY: 132 mg/dL — AB (ref 65–99)
GLUCOSE-CAPILLARY: 184 mg/dL — AB (ref 65–99)
GLUCOSE-CAPILLARY: 136 mg/dL — AB (ref 65–99)
GLUCOSE-CAPILLARY: 119 mg/dL — AB (ref 65–99)

## 2017-04-24 MED ORDER — ZOLPIDEM TARTRATE 5 MG PO TABS: 5.0000 mg | ORAL_TABLET | Freq: Every evening | ORAL | Status: DC | PRN

## 2017-04-24 NOTE — Progress Notes (Signed)
PROGRESS NOTE   [name]  LHT:[id number]    DOB: [birth year]    DOA: 04/21/2017  PCP: Redmond School, MD    Brief Narrative:  76 year old female with PMH of anemia, mild-moderate AS & AI, bilateral carotid stenosis, cirrhosis due to NASH with esophageal varices & banding, CAD status post remote stent, cardiac cath 09/2016 showed only mild atherosclerosis/nonobstructive disease, PSVT, HTN, GERD, stroke, seizures, HLD, OSA, type II DM, s/p left femur IM nail 03/02/17 following which she was undergoing rehabilitation at [facility] in [place] and apparently doing well with ambulation and activities, presented to ED on 04/21/17 after a mechanical fall and sustained a left femur fracture. Orthopedic consulted, unfortunately patient had already eaten breakfast on 8/14 and hence surgery scheduled for 8/16.  Assessment & Plan:   Principal Problem:   Closed left femoral fracture (HCC) Active Problems:   DM type 2 (diabetes mellitus, type 2) (HCC)   OSA (obstructive sleep apnea)   Aortic insufficiency   HTN (hypertension)   Lower urinary tract infectious disease   Seizure disorder ([place])   1. Complex left femur peri-implant fracture: Secondary to mechanical fall at SNF. S/p ORIF 2. Diet-controlled DM 2: Diet controlled. Continue to monitor blood sugars.  3. Anemia: Stable. Will continue ferrous sulfate. 4. Thrombocytopenia: Stable. Related to cirrhosis. No bleeding reported. Improved on last check to 142 5. Seizure disorder: Continue Dilantin. No recent seizures reported.  6. OSA: Not on CPAP. 7. Essential hypertension: Stable, Continue carvedilol. 8. Acute cystitis: Continue IV ceftriaxone pending urine culture results. 9. PSVT: As per outpatient cardiology notes, previous monitor showed infrequent PACs but clinically from notes suspected PSVT that was not captured on monitor. Stable Continue carvedilol. 10. CAD, history of MI in 1995, status post 2 stents to RCA: Asymptomatic of exertional  dyspnea or chest pain. Cardiac cath January 2018 showed nonobstructive CAD. Echo 10/2016: LVEF 65-70 percent. Continue aspirin and carvedilol. 11. Carotid stenosis: Continue aspirin. 12. NASH Cirrhosis complicated by esophageal varices & banding, thrombocytopenia: No bleeding reported. INR in June was 1.28. Continue folate. 13. GERD: stable contue, PPI. 14. Insomnia: try ambien low dose   DVT prophylaxis: Per orthopedic surgery Code Status: Full Family Communication: None at bedside Disposition: Return to SNF post surgery most likely next a.m.   Consultants:  Orthopedics   Procedures:  Foley catheter  Antimicrobials:  IV ceftriaxone    Subjective: Patient complaining of insomnia and requesting assistance with this  BP: (!) 134/42 (!) 114/48 (!) 127/40 (!) 117/37  Pulse: 77 (!) 103 100 100  Resp: 13 15 16 17   Temp: 98.2 F (36.8 C) 99 F (37.2 C) 98.8 F (37.1 C) 99.4 F (37.4 C)  TempSrc: Oral Oral Oral Oral  SpO2: 97% 98% 96% 98%  Weight:      Height:        Examination:Exam unchanged when compared to 04/23/2017  General exam: Pt in nad, alert and awake Respiratory system: Clear to auscultation. Respiratory effort normal. Equal chest rise. Cardiovascular system: S1 & S2 heard, RRR. No JVD, murmurs, rubs, gallops or clicks. No pedal edema. Gastrointestinal system: Abdomen is nondistended, soft and nontender. No organomegaly or masses felt. Normal bowel sounds heard. Central nervous system: Alert and oriented. No focal neurological deficits. Extremities: Symmetric 5 x 5 power except left lower extremity where evaluation is limited due to pain. Swelling, increased warmth and tenderness of distal left thigh and knee.  Skin: No rashes, lesions or ulcers, on limited exam. Psychiatry: Judgement and insight appear normal.  Mood & affect appropriate.     Data Reviewed: I have personally reviewed following labs and imaging studies  CBC:  Recent Labs Lab 04/21/17 0103  04/21/17 0621 04/22/17 0514 04/23/17 0305 04/24/17 0332  WBC 9.9 8.0 6.1 7.1 12.5*  NEUTROABS 7.5  --  4.0 4.4 8.8*  HGB 10.7* 10.6* 9.8* 9.3* 9.4*  HCT 31.2* 31.8* 30.2* 28.0* 27.7*  MCV 92.3 91.9 93.2 93.0 90.2  PLT 129* 137* 115* 118* 349*   Basic Metabolic Panel:  Recent Labs Lab 04/21/17 0103 04/21/17 0621 04/22/17 0514 04/23/17 0305  NA 134* 135 141 137  K 4.3 3.9 4.7 4.5  CL 104 103 108 104  CO2 23 24 27 26   GLUCOSE 139* 115* 104* 115*  BUN 15 13 10 10   CREATININE 0.53 0.58 0.58 0.57  CALCIUM 8.5* 8.2* 8.4*  8.4* 8.3*  MG  --   --  1.7  --   PHOS  --   --  4.8*  --    Liver Function Tests:  Recent Labs Lab 04/21/17 0621 04/22/17 0514  AST 34 23  ALT 38 30  ALKPHOS 241* 226*  BILITOT 1.1 0.8  PROT 4.6* 4.6*  ALBUMIN 2.5* 2.4*   Coagulation Profile:  Recent Labs Lab 04/22/17 0514 04/23/17 0305  INR 1.24 1.13   Cardiac Enzymes: No results for input(s): CKTOTAL, CKMB, CKMBINDEX, TROPONINI in the last 168 hours. HbA1C: No results for input(s): HGBA1C in the last 72 hours. CBG:  Recent Labs Lab 04/23/17 1304 04/23/17 1628 04/23/17 2021 04/24/17 0626 04/24/17 1159  GLUCAP 158* 158* 173* 132* 184*    Recent Results (from the past 240 hour(s))  Urine culture     Status: Abnormal   Collection Time: 04/21/17  2:00 AM  Result Value Ref Range Status   Specimen Description URINE, CATHETERIZED  Final   Special Requests NONE  Final   Culture >=100,000 COLONIES/mL ESCHERICHIA COLI (A)  Final   Report Status 04/23/2017 FINAL  Final   Organism ID, Bacteria ESCHERICHIA COLI (A)  Final      Susceptibility   Escherichia coli - MIC*    AMPICILLIN >=32 RESISTANT Resistant     CEFAZOLIN 16 SENSITIVE Sensitive     CEFTRIAXONE <=1 SENSITIVE Sensitive     CIPROFLOXACIN <=0.25 SENSITIVE Sensitive     GENTAMICIN <=1 SENSITIVE Sensitive     IMIPENEM <=0.25 SENSITIVE Sensitive     NITROFURANTOIN <=16 SENSITIVE Sensitive     TRIMETH/SULFA <=20 SENSITIVE  Sensitive     AMPICILLIN/SULBACTAM 16 INTERMEDIATE Intermediate     PIP/TAZO <=4 SENSITIVE Sensitive     Extended ESBL NEGATIVE Sensitive     * >=100,000 COLONIES/mL ESCHERICHIA COLI  Surgical pcr screen     Status: Abnormal   Collection Time: 04/21/17  8:01 AM  Result Value Ref Range Status   MRSA, PCR POSITIVE (A) NEGATIVE Final    Comment: RESULT CALLED TO, READ BACK BY AND VERIFIED WITH: Michele Rockers RN, AT 1021 04/21/17 BY D. VANHOOK    Staphylococcus aureus POSITIVE (A) NEGATIVE Final    Comment:        The Xpert SA Assay (FDA approved for NASAL specimens in patients over 36 years of age), is one component of a comprehensive surveillance program.  Test performance has been validated by [facility] for patients greater than or equal to 53 year old. It is not intended to diagnose infection nor to guide or monitor treatment.          Radiology Studies:  Dg C-arm 61-120 Min  Result Date: 04/23/2017 CLINICAL DATA:  Elective surgery. EXAM: LEFT FEMUR 2 VIEWS; DG C-ARM 61-120 MIN COMPARISON:  No prior. FINDINGS: ORIF left femur. Hardware intact. Anatomic alignment. No acute bony abnormality . IMPRESSION: ORIF left femur.  Anatomic alignment. Electronically Signed   By: Marcello Moores  Register   On: 04/23/2017 13:09   Dg Femur Min 2 Views Left  Result Date: 04/23/2017 CLINICAL DATA:  Elective surgery. EXAM: LEFT FEMUR 2 VIEWS; DG C-ARM 61-120 MIN COMPARISON:  No prior. FINDINGS: ORIF left femur. Hardware intact. Anatomic alignment. No acute bony abnormality . IMPRESSION: ORIF left femur.  Anatomic alignment. Electronically Signed   By: Marcello Moores  Register   On: 04/23/2017 13:09   Dg Femur Port Min 2 Views Left  Result Date: 04/23/2017 CLINICAL DATA:  Open reduction internal fixation. EXAM: LEFT FEMUR PORTABLE 2 VIEWS COMPARISON:  04/21/2017 . FINDINGS: ORIF left femur. Hardware intact. Anatomic alignment. Bony fragments noted over the suprapatellar space. Peripheral vascular calcification .  IMPRESSION: 1. ORIF left femur. Anatomic alignment. Bony fragments noted over the suprapatellar space. 2. Peripheral vascular disease. Electronically Signed   By: [place]   On: 04/23/2017 14:24        Scheduled Meds: . aspirin  324 mg Oral QHS  . carvedilol  12.5 mg Oral BID WC  . Chlorhexidine Gluconate Cloth  6 each Topical Q0600  . docusate sodium  100 mg Oral Daily  . feeding supplement (GLUCERNA SHAKE)  237 mL Oral BID WC  . ferrous sulfate  325 mg Oral Q breakfast  . folic acid  1 mg Oral Daily  . heparin  5,000 Units Subcutaneous Q8H  . mupirocin ointment  1 application Nasal BID  . pantoprazole  40 mg Oral Daily  . phenytoin  100 mg Oral BID  . potassium chloride SA  20 mEq Oral Daily   Continuous Infusions: . sodium chloride    . cefTRIAXone (ROCEPHIN)  IV Stopped (04/24/17 0759)  . lactated ringers 10 mL/hr at 04/23/17 0734     LOS: 3 days     Velvet Bathe, MD, FACP, [facility]. [facility] Pager [phone]  If 7PM-7AM, please contact night-coverage [email or web address] Password [email or web address] 04/24/2017, 4:12 PM

## 2017-04-24 NOTE — Evaluation (Signed)
Physical Therapy Evaluation Patient Details Name: [name] MRN: [id number] DOB: September 17, 1940 Today's Date: 04/24/2017   History of Present Illness  [name] is a 76 y.o. female with medical history significant of anemia, aortic insufficiency, back pain carotid artery stenosis, liver cirrhosis due to NASH, esophageal varices, CAD, lumbar DDD, essential hypertension, GERD/hiatal hernia, history of CVA, hyperlipidemia, obstructive sleep apnea, PSVT, recurring UTIs, type 2 diabetes, who is brought from [facility] in [place], [place] via EMS after a fall affecting the left femur.  Previous history of seizures 02/28/17 and L femur fx with IM nailing 03/03/17.  Now s/p ORIF of L distal femur fx around IM nail and ORIF L mid shaft femur fx and removal of distal locking screw on 04/23/17.  Clinical Impression  Patient presents with decreased mobility due to pain, non-weight bearing L LE, decreased ROM and strength as well as posterior hip precautions.  She will benefit from skilled PT in the acute setting and will need SNF rehab at d/c.  Currently mod +2 A for OOB to chair, able to maintain NWB with walker for brief stand pivot.     Follow Up Recommendations SNF;Supervision/Assistance - 24 hour    Equipment Recommendations  Other (comment) (TBA at next venue)    Recommendations for Other Services       Precautions / Restrictions Precautions Precautions: Fall;Posterior Hip Precaution Comments: reviewed precautions verbally with pt. Restrictions Weight Bearing Restrictions: Yes LLE Weight Bearing: Non weight bearing      Mobility  Bed Mobility Overal bed mobility: Needs Assistance Bed Mobility: Supine to Sit     Supine to sit: HOB elevated;Max assist     General bed mobility comments: cues for using R leg to assist with scooting hips to EOB and assist for L LE and to lift trunk  Transfers Overall transfer level: Needs assistance Equipment used: Rolling walker (2  wheeled) Transfers: Sit to/from [facility] Sit to Stand: +2 physical assistance;Mod assist Stand pivot transfers: Mod assist;+2 physical assistance       General transfer comment: lifting and lowering help, pivotal scoots with R foot with walker to chair   Ambulation/Gait                Stairs            Wheelchair Mobility    Modified Rankin (Stroke Patients Only)       Balance Overall balance assessment: Needs assistance Sitting-balance support: Single extremity supported Sitting balance-Leahy Scale: Poor Sitting balance - Comments: imbalance at EOB due to painful L hip and rail hitting back of thigh, held onto railing or bed for balance   Standing balance support: Bilateral upper extremity supported Standing balance-Leahy Scale: Poor                               Pertinent Vitals/Pain Pain Assessment: 0-10 Pain Score: 3  Pain Location: L hip and lower leg Pain Descriptors / Indicators: Sharp Pain Intervention(s): Monitored during session;Repositioned    Home Living Family/patient expects to be discharged to:: Skilled nursing facility Living Arrangements: Alone                    Prior Function           Comments: was independent prior to femur fx in June, had been walking without walker in nursing facility     Hand Dominance   Dominant Hand: Right  Extremity/Trunk Assessment   Upper Extremity Assessment Upper Extremity Assessment: Generalized weakness    Lower Extremity Assessment Lower Extremity Assessment: LLE deficits/detail LLE Deficits / Details: pt with equinovarus positioning of L foot premorbid per pt. and able to flex ankle more on medial aspect than lateral  noted edema throughout thigh and pain with movement, limited movement due to pain       Communication   Communication: No difficulties  Cognition Arousal/Alertness: Awake/alert Behavior During Therapy: WFL for tasks  assessed/performed Overall Cognitive Status: Within Functional Limits for tasks assessed                                        General Comments      Exercises Total Joint Exercises Ankle Circles/Pumps: AROM;Both;10 reps;Supine Quad Sets: AROM;Left;5 reps;Supine Short Arc Quad: AAROM;Left;5 reps;Supine   Assessment/Plan    PT Assessment Patient needs continued PT services  PT Problem List Decreased mobility;Decreased strength;Decreased range of motion;Decreased activity tolerance;Decreased balance;Decreased knowledge of use of DME;Pain;Decreased safety awareness;Decreased knowledge of precautions       PT Treatment Interventions DME instruction;Therapeutic exercise;Patient/family education;Therapeutic activities;Balance training;Functional mobility training    PT Goals (Current goals can be found in the Care Plan section)  Acute Rehab PT Goals Patient Stated Goal: To walk again PT Goal Formulation: With patient Time For Goal Achievement: 05/08/17 Potential to Achieve Goals: Fair    Frequency Min 3X/week   Barriers to discharge        Co-evaluation               AM-PAC PT "6 Clicks" Daily Activity  Outcome Measure Difficulty turning over in bed (including adjusting bedclothes, sheets and blankets)?: Unable Difficulty moving from lying on back to sitting on the side of the bed? : Unable Difficulty sitting down on and standing up from a chair with arms (e.g., wheelchair, bedside commode, etc,.)?: Unable Help needed moving to and from a bed to chair (including a wheelchair)?: A Lot Help needed walking in hospital room?: Total Help needed climbing 3-5 steps with a railing? : Total 6 Click Score: 7    End of Session Equipment Utilized During Treatment: Gait belt Activity Tolerance: Patient limited by pain Patient left: in chair;with call bell/phone within reach;with chair alarm set Nurse Communication: Mobility status PT Visit Diagnosis: History of  falling (Z91.81);Other abnormalities of gait and mobility (R26.89);Pain Pain - Right/Left: Left Pain - part of body: Leg    Time: 2836-6294 PT Time Calculation (min) (ACUTE ONLY): 33 min   Charges:   PT Evaluation $PT Eval High Complexity: 1 High PT Treatments $Therapeutic Activity: 8-22 mins   PT G CodesMagda Kiel, [place] [phone] 04/24/2017   Reginia Naas 04/24/2017, 10:15 AM

## 2017-04-24 NOTE — Discharge Instructions (Signed)
[facility] Discharge Instructions   General Discharge Instructions  WEIGHT BEARING STATUS: nonweightbearing left leg  RANGE OF MOTION/ACTIVITY: unrestricted range of motion all joints  Wound Care: daily wound care. Ok to shower. See below Discharge Wound Care Instructions  Do NOT apply any ointments, solutions or lotions to pin sites or surgical wounds.  These prevent needed drainage and even though solutions like hydrogen peroxide kill bacteria, they also damage cells lining the pin sites that help fight infection.  Applying lotions or ointments can keep the wounds moist and can cause them to breakdown and open up as well. This can increase the risk for infection. When in doubt call the office.  Surgical incisions should be dressed daily.  If any drainage is noted, use one layer of adaptic, then gauze, Kerlix, and an ace wrap.  Once the incision is completely dry and without drainage, it may be left open to air out.  Showering may begin 36-48 hours later.  Cleaning gently with soap and water.  Traumatic wounds should be dressed daily as well.    One layer of adaptic, gauze, Kerlix, then ace wrap.  The adaptic can be discontinued once the draining has ceased    If you have a wet to dry dressing: wet the gauze with saline the squeeze as much saline out so the gauze is moist (not soaking wet), place moistened gauze over wound, then place a dry gauze over the moist one, followed by Kerlix wrap, then ace wrap.  DVT/PE prophylaxis: Lovenox x 21 days   Diet: as you were eating previously.  Can use over the counter stool softeners and bowel preparations, such as Miralax, to help with bowel movements.  Narcotics can be constipating.  Be sure to drink plenty of fluids  PAIN MEDICATION USE AND EXPECTATIONS  You have likely been given narcotic medications to help control your pain.  After a traumatic event that results in an fracture (broken bone) with or without surgery, it is ok  to use narcotic pain medications to help control one's pain.  We understand that everyone responds to pain differently and each individual patient will be evaluated on a regular basis for the continued need for narcotic medications. Ideally, narcotic medication use should last no more than 6-8 weeks (coinciding with fracture healing).   As a patient it is your responsibility as well to monitor narcotic medication use and report the amount and frequency you use these medications when you come to your office visit.   We would also advise that if you are using narcotic medications, you should take a dose prior to therapy to maximize you participation.  IF YOU ARE ON NARCOTIC MEDICATIONS IT IS NOT PERMISSIBLE TO OPERATE A MOTOR VEHICLE (MOTORCYCLE/CAR/TRUCK/MOPED) OR HEAVY MACHINERY DO NOT MIX NARCOTICS WITH OTHER CNS (CENTRAL NERVOUS SYSTEM) DEPRESSANTS SUCH AS ALCOHOL   STOP SMOKING OR USING NICOTINE PRODUCTS!!!!  As discussed nicotine severely impairs your body's ability to heal surgical and traumatic wounds but also impairs bone healing.  Wounds and bone heal by forming microscopic blood vessels (angiogenesis) and nicotine is a vasoconstrictor (essentially, shrinks blood vessels).  Therefore, if vasoconstriction occurs to these microscopic blood vessels they essentially disappear and are unable to deliver necessary nutrients to the healing tissue.  This is one modifiable factor that you can do to dramatically increase your chances of healing your injury.    (This means no smoking, no nicotine gum, patches, etc)  DO NOT USE NONSTEROIDAL ANTI-INFLAMMATORY DRUGS (NSAID'S)  Using  products such as Advil (ibuprofen), Aleve (naproxen), Motrin (ibuprofen) for additional pain control during fracture healing can delay and/or prevent the healing response.  If you would like to take over the counter (OTC) medication, Tylenol (acetaminophen) is ok.  However, some narcotic medications that are given for pain control  contain acetaminophen as well. Therefore, you should not exceed more than 4000 mg of tylenol in a day if you do not have liver disease.  Also note that there are may OTC medicines, such as cold medicines and allergy medicines that my contain tylenol as well.  If you have any questions about medications and/or interactions please ask your doctor/PA or your pharmacist.      ICE AND ELEVATE INJURED/OPERATIVE EXTREMITY  Using ice and elevating the injured extremity above your heart can help with swelling and pain control.  Icing in a pulsatile fashion, such as 20 minutes on and 20 minutes off, can be followed.    Do not place ice directly on skin. Make sure there is a barrier between to skin and the ice pack.    Using frozen items such as frozen peas works well as the conform nicely to the are that needs to be iced.  USE AN ACE WRAP OR TED HOSE FOR SWELLING CONTROL  In addition to icing and elevation, Ace wraps or TED hose are used to help limit and resolve swelling.  It is recommended to use Ace wraps or TED hose until you are informed to stop.    When using Ace Wraps start the wrapping distally (farthest away from the body) and wrap proximally (closer to the body)   Example: If you had surgery on your leg or thing and you do not have a splint on, start the ace wrap at the toes and work your way up to the thigh        If you had surgery on your upper extremity and do not have a splint on, start the ace wrap at your fingers and work your way up to the upper arm  IF YOU ARE IN A SPLINT OR CAST DO NOT [place]   If your splint gets wet for any reason please contact the office immediately. You may shower in your splint or cast as long as you keep it dry.  This can be done by wrapping in a cast cover or garbage back (or similar)  Do Not stick any thing down your splint or cast such as pencils, money, or hangers to try and scratch yourself with.  If you feel itchy take benadryl as prescribed  on the bottle for itching  IF YOU ARE IN A CAM BOOT (BLACK BOOT)  You may remove boot periodically. Perform daily dressing changes as noted below.  Wash the liner of the boot regularly and wear a sock when wearing the boot. It is recommended that you sleep in the boot until told otherwise  CALL THE OFFICE WITH ANY QUESTIONS OR CONCERNS: [phone]

## 2017-04-24 NOTE — Plan of Care (Signed)
Problem: Safety: Goal: Ability to remain free from injury will improve Patient's belongings and call light in reach, patient calls for help when needed.

## 2017-04-24 NOTE — Evaluation (Signed)
Occupational Therapy Evaluation Patient Details Name: [name] MRN: [id number] DOB: [birth year] Today's Date: 04/24/2017    History of Present Illness [name] is a 76 y.o. female with medical history significant of anemia, aortic insufficiency, back pain carotid artery stenosis, liver cirrhosis due to NASH, esophageal varices, CAD, lumbar DDD, essential hypertension, GERD/hiatal hernia, history of CVA, hyperlipidemia, obstructive sleep apnea, PSVT, recurring UTIs, type 2 diabetes, who is brought from [facility] in [place], [place] via EMS after a fall affecting the left femur.  Previous history of seizures 02/28/17 and L femur fx with IM nailing 03/03/17.  Now s/p ORIF of L distal femur fx around IM nail and ORIF L mid shaft femur fx and removal of distal locking screw on 04/23/17.   Clinical Impression   This 76 y/o F presents with the above. At baseline Pt reports she was independent with ADLs and functional mobility. Pt was most recently in a nursing facility for a previous femur fx in June. Pt currently requires MaxA +2 for bed mobility, ModA +2 for sit<>stand at RW with verbal cues for adhering to NWB and posterior hip precautions in LLE. Pt requires Total assist for LB ADLs. Pt will benefit from continued acute OT services and from additional OT services in SNF setting prior to return home to maximize Pt's safety and independence with ADLs and functional mobility.     Follow Up Recommendations  SNF    Equipment Recommendations  Other (comment) (TBD in next venue )           Precautions / Restrictions Precautions Precautions: Fall;Posterior Hip Precaution Comments: reviewed precautions verbally with pt. Restrictions Weight Bearing Restrictions: Yes LLE Weight Bearing: Non weight bearing      Mobility Bed Mobility Overal bed mobility: Needs Assistance Bed Mobility: Supine to Sit;Sit to Supine     Supine to sit: HOB elevated;Max assist;+2 for physical  assistance;+2 for safety/equipment Sit to supine: Max assist;+2 for physical assistance;+2 for safety/equipment   General bed mobility comments: assist for LLE management and trunk support to bring upright into sitting   Transfers Overall transfer level: Needs assistance Equipment used: Rolling walker (2 wheeled) Transfers: Sit to/from Stand Sit to Stand: +2 physical assistance;Mod assist         General transfer comment: assist to lift and lower; verbal cues for maintaining NWB in LLE     Balance Overall balance assessment: Needs assistance Sitting-balance support: Single extremity supported Sitting balance-Leahy Scale: Poor Sitting balance - Comments: requires MinA to maintain balance sitting EOB   Standing balance support: Bilateral upper extremity supported Standing balance-Leahy Scale: Poor                             ADL either performed or assessed with clinical judgement   ADL Overall ADL's : Needs assistance/impaired Eating/Feeding: Set up;Sitting   Grooming: Set up;Oral care;Bed level   Upper Body Bathing: Min guard;Sitting   Lower Body Bathing: Maximal assistance;+2 for physical assistance;+2 for safety/equipment;Adhering to hip precautions   Upper Body Dressing : Sitting;Minimal assistance   Lower Body Dressing: Maximal assistance;+2 for physical assistance;+2 for safety/equipment;Adhering to hip precautions;Sit to/from stand       Toileting- Water quality scientist and Hygiene: Total assistance;Bed level       Functional mobility during ADLs: Moderate assistance;+2 for physical assistance General ADL Comments: Pt completed sit<>stand at EOB with ModA +2, Pt declining further mobility at this time (just recently got back to  bed from being up in chair and experiencing increased nausea upon standing)                         Pertinent Vitals/Pain Pain Assessment: Faces Faces Pain Scale: Hurts little more Pain Location: L hip and lower  leg Pain Descriptors / Indicators: Sharp Pain Intervention(s): Limited activity within patient's tolerance;Monitored during session;Repositioned     Hand Dominance Right   Extremity/Trunk Assessment Upper Extremity Assessment Upper Extremity Assessment: Generalized weakness   Lower Extremity Assessment Lower Extremity Assessment: Defer to PT evaluation       Communication Communication Communication: No difficulties   Cognition Arousal/Alertness: Awake/alert Behavior During Therapy: WFL for tasks assessed/performed Overall Cognitive Status: Within Functional Limits for tasks assessed                                                      Home Living Family/patient expects to be discharged to:: Skilled nursing facility Living Arrangements: Alone                                      Prior Functioning/Environment          Comments: was independent prior to femur fx in June, had been walking without walker in nursing facility        OT Problem List: Decreased strength;Impaired balance (sitting and/or standing);Decreased knowledge of precautions;Pain;Decreased knowledge of use of DME or AE;Decreased activity tolerance      OT Treatment/Interventions: Self-care/ADL training;DME and/or AE instruction;Therapeutic activities;Balance training;Patient/family education;Therapeutic exercise    OT Goals(Current goals can be found in the care plan section) Acute Rehab OT Goals Patient Stated Goal: To walk again OT Goal Formulation: With patient Time For Goal Achievement: 05/08/17 Potential to Achieve Goals: Good  OT Frequency: Min 2X/week                             AM-PAC PT "6 Clicks" Daily Activity     Outcome Measure Help from another person eating meals?: None Help from another person taking care of personal grooming?: A Little Help from another person toileting, which includes using toliet, bedpan, or urinal?: Total Help  from another person bathing (including washing, rinsing, drying)?: A Lot Help from another person to put on and taking off regular upper body clothing?: A Little Help from another person to put on and taking off regular lower body clothing?: Total 6 Click Score: 14   End of Session Equipment Utilized During Treatment: Gait belt;Rolling walker Nurse Communication: Mobility status  Activity Tolerance: Patient tolerated treatment well;Patient limited by pain Patient left: in bed;with call bell/phone within reach  OT Visit Diagnosis: Unsteadiness on feet (R26.81);Muscle weakness (generalized) (M62.81);Pain Pain - Right/Left: Left Pain - part of body: Hip                Time: 2956-2130 OT Time Calculation (min): 27 min Charges:  OT General Charges $OT Visit: 1 Procedure OT Evaluation $OT Eval Moderate Complexity: 1 Procedure OT Treatments $Self Care/Home Management : 8-22 mins G-Codes:     Lou Cal, OT Pager [phone] 04/24/2017   Raymondo Band 04/24/2017, 2:50 PM

## 2017-04-24 NOTE — Progress Notes (Signed)
Orthopedic Trauma Service Progress Note   Patient ID: [name] MRN: [id number] DOB/AGE: [birth year] 76 y.o.  Subjective:  Doing well No complaints Has worked with PT/OT today Sat in chair for about 1 hours  Wants to go to [facility] at dc   ROS As above  Objective:   VITALS:   Vitals:   04/23/17 1530 04/23/17 2017 04/24/17 0022 04/24/17 0623  BP: (!) 134/42 (!) 114/48 (!) 127/40 (!) 117/37  Pulse: 77 (!) 103 100 100  Resp: 13 15 16 17   Temp: 98.2 F (36.8 C) 99 F (37.2 C) 98.8 F (37.1 C) 99.4 F (37.4 C)  TempSrc: Oral Oral Oral Oral  SpO2: 97% 98% 96% 98%  Weight:      Height:        Intake/Output      08/16 0701 - 08/17 0700 08/17 0701 - 08/18 0700   P.O. 600 240   I.V. (mL/kg) 900 (15.2)    Blood 315    IV Piggyback 250 50   Total Intake(mL/kg) 2065 (34.8) 290 (4.9)   Urine (mL/kg/hr) 1520 (1.1)    Blood 1000    Total Output 2520     Net -455 +290        Urine Occurrence 1 x      LABS  Results for orders placed or performed during the hospital encounter of 04/21/17 (from the past 24 hour(s))  Glucose, capillary     Status: Abnormal   Collection Time: 04/23/17  1:04 PM  Result Value Ref Range   Glucose-Capillary 158 (H) 65 - 99 mg/dL   Comment 1 Notify RN    Comment 2 Document in Chart   Glucose, capillary     Status: Abnormal   Collection Time: 04/23/17  4:28 PM  Result Value Ref Range   Glucose-Capillary 158 (H) 65 - 99 mg/dL  Glucose, capillary     Status: Abnormal   Collection Time: 04/23/17  8:21 PM  Result Value Ref Range   Glucose-Capillary 173 (H) 65 - 99 mg/dL  CBC WITH DIFFERENTIAL     Status: Abnormal   Collection Time: 04/24/17  3:32 AM  Result Value Ref Range   WBC 12.5 (H) 4.0 - 10.5 K/uL   RBC 3.07 (L) 3.87 - 5.11 MIL/uL   Hemoglobin 9.4 (L) 12.0 - 15.0 g/dL   HCT 27.7 (L) 36.0 - 46.0 %   MCV 90.2 78.0 - 100.0 fL   MCH 30.6 26.0 - 34.0 pg   MCHC 33.9 30.0 - 36.0 g/dL   RDW 15.5 11.5 - 15.5 %    Platelets 142 (L) 150 - 400 K/uL   Neutrophils Relative % 70 %   Neutro Abs 8.8 (H) 1.7 - 7.7 K/uL   Lymphocytes Relative 15 %   Lymphs Abs 1.8 0.7 - 4.0 K/uL   Monocytes Relative 15 %   Monocytes Absolute 1.8 (H) 0.1 - 1.0 K/uL   Eosinophils Relative 0 %   Eosinophils Absolute 0.0 0.0 - 0.7 K/uL   Basophils Relative 0 %   Basophils Absolute 0.0 0.0 - 0.1 K/uL  Glucose, capillary     Status: Abnormal   Collection Time: 04/24/17  6:26 AM  Result Value Ref Range   Glucose-Capillary 132 (H) 65 - 99 mg/dL  Glucose, capillary     Status: Abnormal   Collection Time: 04/24/17 11:59 AM  Result Value Ref Range   Glucose-Capillary 184 (H) 65 - 99 mg/dL     PHYSICAL EXAM:    Gen:  resting comfortably in bed, NAD Lungs: breathing unlabored  Cardiac: regular  Ext:       Left Lower Extremity   Dressing stable  Scant drainage  Baseline foot and ankle contracture unchanged  Motor and sensory functions at baseline  Ext warm   Mild swelling  Ext warm   + DP pulse    Assessment/Plan: 1 Day Post-Op   Principal Problem:   Closed left femoral fracture (HCC) Active Problems:   DM type 2 (diabetes mellitus, type 2) (HCC)   OSA (obstructive sleep apnea)   Aortic insufficiency   HTN (hypertension)   Lower urinary tract infectious disease   Seizure disorder (HCC)   Anti-infectives    Start     Dose/Rate Route Frequency Ordered Stop   04/23/17 0900  vancomycin (VANCOCIN) IVPB 1000 mg/200 mL premix  Status:  Discontinued     1,000 mg 200 mL/hr over 60 Minutes Intravenous To Surgery 04/23/17 0853 04/23/17 1513   04/23/17 0800  ceFAZolin (ANCEF) IVPB 1 g/50 mL premix     1 g 100 mL/hr over 30 Minutes Intravenous 60 min pre-op 04/22/17 0948 04/23/17 0827   04/22/17 0600  cefTRIAXone (ROCEPHIN) 1 g in dextrose 5 % 50 mL IVPB     1 g 100 mL/hr over 30 Minutes Intravenous Every 24 hours 04/21/17 0652     04/21/17 0700  cefTRIAXone (ROCEPHIN) 1 g in dextrose 5 % 50 mL IVPB     1 g 100  mL/hr over 30 Minutes Intravenous  Once 04/21/17 0655 04/21/17 1056    .  POD/HD#: 81  76 year old female ground-level fall with left supracondylar distal femur fracture, peri-implant   -fall   - Left supracondylar distal femur fracture, peri-implant s/p ORIF           NWB x 8 weeks  Unrestricted ROM all joints  Dressing change tomorrow   PT/OT  Ice and elevate  TED hose   Decubitus precautions  SNF    - Left foot/ankle equinovarus deformity/contracture              Patient states that she's had this all of her life and has compensated for it. She also states that she does not use any assistive devices or bracing.             Monitor             Contracture seems severe enough that it may pose a fall risk   - Pain management:             Continue with current regimen             Pt quite comfortable    - ABL anemia/Hemodynamics             Stable             Monitor   - Medical issues              Per primary service               UTI                         On rocephin                         + E.coli   Defer to medicine regarding tx               -  DVT/PE prophylaxis:             Sq heparin   Recommend lovenox 40 mg sq daily x 21 days post op    - Metabolic Bone Disease:             vitamin D levels satisfactory   Still recommend supplementation as levels > 40 have been shown to improve balance/muscular function              Needs DEXA             poor bone quality   Osteoporosis    - Activity:             NWB L leg    - FEN/GI prophylaxis/Foley/Lines:             Diet as tolerated             dc foley    - Impediments to fracture healing:             Osteoporosis             Deconditioning  - Dispo:             Ortho issues stable  Ok for SNF when bed available  Follow up with ortho in 10-14 days   Jari Pigg, PA-C [facility] [phone] (O) 04/24/2017, 12:54 PM

## 2017-04-25 DIAGNOSIS — G8911 Acute pain due to trauma: Secondary | ICD-10-CM | POA: Diagnosis not present

## 2017-04-25 DIAGNOSIS — R6889 Other general symptoms and signs: Secondary | ICD-10-CM | POA: Diagnosis not present

## 2017-04-25 DIAGNOSIS — Z9181 History of falling: Secondary | ICD-10-CM | POA: Diagnosis not present

## 2017-04-25 DIAGNOSIS — E785 Hyperlipidemia, unspecified: Secondary | ICD-10-CM | POA: Diagnosis not present

## 2017-04-25 DIAGNOSIS — D649 Anemia, unspecified: Secondary | ICD-10-CM | POA: Diagnosis not present

## 2017-04-25 DIAGNOSIS — R2681 Unsteadiness on feet: Secondary | ICD-10-CM | POA: Diagnosis not present

## 2017-04-25 DIAGNOSIS — S728X9A Other fracture of unspecified femur, initial encounter for closed fracture: Secondary | ICD-10-CM | POA: Diagnosis not present

## 2017-04-25 DIAGNOSIS — S72352D Displaced comminuted fracture of shaft of left femur, subsequent encounter for closed fracture with routine healing: Secondary | ICD-10-CM | POA: Diagnosis not present

## 2017-04-25 DIAGNOSIS — I2510 Atherosclerotic heart disease of native coronary artery without angina pectoris: Secondary | ICD-10-CM | POA: Diagnosis not present

## 2017-04-25 DIAGNOSIS — D509 Iron deficiency anemia, unspecified: Secondary | ICD-10-CM | POA: Diagnosis not present

## 2017-04-25 DIAGNOSIS — Z7401 Bed confinement status: Secondary | ICD-10-CM | POA: Diagnosis not present

## 2017-04-25 DIAGNOSIS — R262 Difficulty in walking, not elsewhere classified: Secondary | ICD-10-CM | POA: Diagnosis not present

## 2017-04-25 DIAGNOSIS — S72402A Unspecified fracture of lower end of left femur, initial encounter for closed fracture: Secondary | ICD-10-CM | POA: Diagnosis not present

## 2017-04-25 DIAGNOSIS — S72452D Displaced supracondylar fracture without intracondylar extension of lower end of left femur, subsequent encounter for closed fracture with routine healing: Secondary | ICD-10-CM | POA: Diagnosis not present

## 2017-04-25 DIAGNOSIS — I10 Essential (primary) hypertension: Secondary | ICD-10-CM | POA: Diagnosis not present

## 2017-04-25 DIAGNOSIS — S72012A Unspecified intracapsular fracture of left femur, initial encounter for closed fracture: Secondary | ICD-10-CM | POA: Diagnosis not present

## 2017-04-25 DIAGNOSIS — M6281 Muscle weakness (generalized): Secondary | ICD-10-CM | POA: Diagnosis not present

## 2017-04-25 DIAGNOSIS — M7989 Other specified soft tissue disorders: Secondary | ICD-10-CM | POA: Diagnosis not present

## 2017-04-25 DIAGNOSIS — G4700 Insomnia, unspecified: Secondary | ICD-10-CM | POA: Diagnosis not present

## 2017-04-25 DIAGNOSIS — R279 Unspecified lack of coordination: Secondary | ICD-10-CM | POA: Diagnosis not present

## 2017-04-25 DIAGNOSIS — Z79899 Other long term (current) drug therapy: Secondary | ICD-10-CM | POA: Diagnosis not present

## 2017-04-25 DIAGNOSIS — E119 Type 2 diabetes mellitus without complications: Secondary | ICD-10-CM | POA: Diagnosis not present

## 2017-04-25 DIAGNOSIS — T84115D Breakdown (mechanical) of internal fixation device of left femur, subsequent encounter: Secondary | ICD-10-CM | POA: Diagnosis not present

## 2017-04-25 DIAGNOSIS — R569 Unspecified convulsions: Secondary | ICD-10-CM | POA: Diagnosis not present

## 2017-04-25 DIAGNOSIS — K7460 Unspecified cirrhosis of liver: Secondary | ICD-10-CM | POA: Diagnosis not present

## 2017-04-25 DIAGNOSIS — E46 Unspecified protein-calorie malnutrition: Secondary | ICD-10-CM | POA: Diagnosis not present

## 2017-04-25 DIAGNOSIS — M9702XS Periprosthetic fracture around internal prosthetic left hip joint, sequela: Secondary | ICD-10-CM | POA: Diagnosis not present

## 2017-04-25 DIAGNOSIS — M4844XA Fatigue fracture of vertebra, thoracic region, initial encounter for fracture: Secondary | ICD-10-CM | POA: Diagnosis not present

## 2017-04-25 DIAGNOSIS — M4844XD Fatigue fracture of vertebra, thoracic region, subsequent encounter for fracture with routine healing: Secondary | ICD-10-CM | POA: Diagnosis not present

## 2017-04-25 DIAGNOSIS — S72145D Nondisplaced intertrochanteric fracture of left femur, subsequent encounter for closed fracture with routine healing: Secondary | ICD-10-CM | POA: Diagnosis not present

## 2017-04-25 DIAGNOSIS — Z7984 Long term (current) use of oral hypoglycemic drugs: Secondary | ICD-10-CM | POA: Diagnosis not present

## 2017-04-25 DIAGNOSIS — E559 Vitamin D deficiency, unspecified: Secondary | ICD-10-CM | POA: Diagnosis not present

## 2017-04-25 LAB — BPAM RBC
BLOOD PRODUCT EXPIRATION DATE: 201809042359
BLOOD PRODUCT EXPIRATION DATE: 201809052359
BLOOD PRODUCT EXPIRATION DATE: 201809052359
BLOOD PRODUCT EXPIRATION DATE: 201809052359
Blood Product Expiration Date: 201809042359
Blood Product Expiration Date: 201809052359
ISSUE DATE / TIME: 201808160610
ISSUE DATE / TIME: 201808160833
ISSUE DATE / TIME: 201808161213
ISSUE DATE / TIME: 201808161213
UNIT TYPE AND RH: 6200
UNIT TYPE AND RH: 6200
UNIT TYPE AND RH: 6200
UNIT TYPE AND RH: 6200
Unit Type and Rh: 6200
Unit Type and Rh: 6200

## 2017-04-25 LAB — TYPE AND SCREEN
ABO/RH(D): A POS
ANTIBODY SCREEN: NEGATIVE
UNIT DIVISION: 0
UNIT DIVISION: 0
UNIT DIVISION: 0
Unit division: 0
Unit division: 0
Unit division: 0

## 2017-04-25 LAB — PREPARE RBC (CROSSMATCH)

## 2017-04-25 LAB — CBC
HCT: 21.8 % — ABNORMAL LOW (ref 36.0–46.0)
Hemoglobin: 7.5 g/dL — ABNORMAL LOW (ref 12.0–15.0)
MCH: 30.9 pg (ref 26.0–34.0)
MCHC: 34.4 g/dL (ref 30.0–36.0)
MCV: 89.7 fL (ref 78.0–100.0)
PLATELETS: 98 10*3/uL — AB (ref 150–400)
RBC: 2.43 MIL/uL — ABNORMAL LOW (ref 3.87–5.11)
RDW: 14.7 % (ref 11.5–15.5)
WBC: 7.2 10*3/uL (ref 4.0–10.5)

## 2017-04-25 LAB — GLUCOSE, CAPILLARY
GLUCOSE-CAPILLARY: 107 mg/dL — AB (ref 65–99)
GLUCOSE-CAPILLARY: 143 mg/dL — AB (ref 65–99)
Glucose-Capillary: 125 mg/dL — ABNORMAL HIGH (ref 65–99)

## 2017-04-25 MED ORDER — ENOXAPARIN SODIUM 30 MG/0.3ML ~~LOC~~ SOLN: 30.0000 mg | Freq: Every day | SUBCUTANEOUS | Status: DC

## 2017-04-25 MED ORDER — ENOXAPARIN SODIUM 30 MG/0.3ML ~~LOC~~ SOLN
30.0000 mg | Freq: Every day | SUBCUTANEOUS | Status: DC
Start: 2017-04-25 — End: 2017-06-26

## 2017-04-25 MED ORDER — FERROUS SULFATE 325 (65 FE) MG PO TABS
325.0000 mg | ORAL_TABLET | Freq: Three times a day (TID) | ORAL | Status: DC
  Administered 2017-04-25 (×2): 325 mg via ORAL
  Filled 2017-04-25 (×2): qty 1

## 2017-04-25 MED ORDER — ENOXAPARIN SODIUM 40 MG/0.4ML ~~LOC~~ SOLN: 40.0000 mg | Freq: Every day | SUBCUTANEOUS | Status: DC

## 2017-04-25 MED ORDER — ACETAMINOPHEN 325 MG PO TABS: 650.0000 mg | ORAL_TABLET | Freq: Four times a day (QID) | ORAL | Status: DC | PRN

## 2017-04-25 MED ORDER — BISACODYL 10 MG RE SUPP
10.0000 mg | Freq: Once | RECTAL | Status: AC
  Administered 2017-04-25: 10 mg via RECTAL
  Filled 2017-04-25: qty 1

## 2017-04-25 MED ORDER — SODIUM CHLORIDE 0.9 % IV SOLN
Freq: Once | INTRAVENOUS | Status: AC
  Administered 2017-04-25: 18:00:00 via INTRAVENOUS

## 2017-04-25 NOTE — Clinical Social Work Placement (Signed)
   CLINICAL SOCIAL WORK PLACEMENT  NOTE  Date:  04/25/2017  Patient Details  Name: [name] MRN: [id number] Date of Birth: [birth year]  Clinical Social Work is seeking post-discharge placement for this patient at the [place] level of care (*CSW will initial, date and re-position this form in  chart as items are completed):  Yes   Patient/family provided with [place] Work Department's list of facilities offering this level of care within the geographic area requested by the patient (or if unable, by the patient's family).  Yes   Patient/family informed of their freedom to choose among providers that offer the needed level of care, that participate in Medicare, Medicaid or managed care program needed by the patient, have an available bed and are willing to accept the patient.  Yes   Patient/family informed of [facility]'s ownership interest in [facility] and [facility], as well as of the fact that they are under no obligation to receive care at these facilities.  PASRR submitted to EDS on       PASRR number received on       Existing PASRR number confirmed on       FL2 transmitted to all facilities in geographic area requested by pt/family on       FL2 transmitted to all facilities within larger geographic area on       Patient informed that his/her managed care company has contracts with or will negotiate with certain facilities, including the following:        Yes   Patient/family informed of bed offers received.  Patient chooses bed at  (Patient is from [facility])     Physician recommends and patient chooses bed at      Patient to be transferred to  ([facility] [place] ) on 04/25/17.  Patient to be transferred to facility by [facility]     Patient family notified on 04/25/17 of transfer.  Name of family member notified:  Rella Larve     PHYSICIAN       Additional Comment:     _______________________________________________ Raymondo Band, [place] 04/25/2017, 3:19 PM

## 2017-04-25 NOTE — Progress Notes (Signed)
Attempted calling report to SNF. Was transferred a few times, no one picked up so I left a voicemail. Will attempt to call back shortly. Pt is stable and ready for discharge. Has completed the unit of blood that was ordered. Reviewed AVS with patient.  Pt waiting on [facility] for transport.

## 2017-04-25 NOTE — Progress Notes (Signed)
Subjective: 2 Days Post-Op Procedure(s) (LRB): OPEN REDUCTION INTERNAL FIXATION (ORIF) DISTAL FEMUR FRACTURE (FRACTURE AROUND FEMORAL NAIL) (Left) Patient reports pain as 4 on 0-10 scale.    Objective: Vital signs in last 24 hours: Temp:  [98.8 F (37.1 C)-100 F (37.8 C)] 98.8 F (37.1 C) (08/18 0300) Pulse Rate:  [87-94] 87 (08/18 0300) Resp:  [16-17] 16 (08/18 0300) BP: (106-132)/(34-43) 132/34 (08/18 0300) SpO2:  [96 %-98 %] 98 % (08/18 0300)  Intake/Output from previous day: 08/17 0701 - 08/18 0700 In: 650 [P.O.:600; IV Piggyback:50] Out: -  Intake/Output this shift: No intake/output data recorded.   Recent Labs  04/23/17 0305 04/24/17 0332 04/25/17 0216  HGB 9.3* 9.4* 7.5*    Recent Labs  04/24/17 0332 04/25/17 0216  WBC 12.5* 7.2  RBC 3.07* 2.43*  HCT 27.7* 21.8*  PLT 142* 98*    Recent Labs  04/23/17 0305  NA 137  K 4.5  CL 104  CO2 26  BUN 10  CREATININE 0.57  GLUCOSE 115*  CALCIUM 8.3*    Recent Labs  04/23/17 0305  INR 1.13    Neurologically intact ABD soft Neurovascular intact Sensation intact distally Intact pulses distally Incision: scant drainage  Assessment/Plan: 2 Days Post-Op Procedure(s) (LRB): OPEN REDUCTION INTERNAL FIXATION (ORIF) DISTAL FEMUR FRACTURE (FRACTURE AROUND FEMORAL NAIL) (Left)  Principal Problem:   Closed left femoral fracture (HCC) Active Problems:   DM type 2 (diabetes mellitus, type 2) (HCC)   OSA (obstructive sleep apnea)   Aortic insufficiency   HTN (hypertension)   Lower urinary tract infectious disease   Seizure disorder ([facility]) acute post operative blood loss anemia to be addressed by medicine   OK from orthopedic stand point to go to SNF today if bed available and medically stable.  Non weight bearing on left lower extremity. Advance diet Up with therapy Discharge to SNF  [facility] 04/25/2017, 7:40 AM

## 2017-04-25 NOTE — Progress Notes (Signed)
Subjective: 2 Days Post-Op Procedure(s) (LRB): OPEN REDUCTION INTERNAL FIXATION (ORIF) DISTAL FEMUR FRACTURE (FRACTURE AROUND FEMORAL NAIL) (Left) Patient reports pain as 2 on 0-10 scale.   The patient states that her pain is well-controlled and she denies any complaints. She is doing well with therapy. She is tolerating her diet with no difficulty. She states that she has passed flatus but has not had a bowel movement.  Objective: Vital signs in last 24 hours: Temp:  [98.8 F (37.1 C)-100 F (37.8 C)] 98.8 F (37.1 C) (08/18 0300) Pulse Rate:  [87-94] 90 (08/18 0852) Resp:  [16-17] 16 (08/18 0300) BP: (106-132)/(34-48) 125/48 (08/18 0852) SpO2:  [96 %-98 %] 97 % (08/18 0852)  Intake/Output from previous day: 08/17 0701 - 08/18 0700 In: 650 [P.O.:600; IV Piggyback:50] Out: 1100 [Urine:1100] Intake/Output this shift: Total I/O In: 360 [P.O.:360] Out: -    Recent Labs  04/23/17 0305 04/24/17 0332 04/25/17 0216  HGB 9.3* 9.4* 7.5*    Recent Labs  04/24/17 0332 04/25/17 0216  WBC 12.5* 7.2  RBC 3.07* 2.43*  HCT 27.7* 21.8*  PLT 142* 98*    Recent Labs  04/23/17 0305  NA 137  K 4.5  CL 104  CO2 26  BUN 10  CREATININE 0.57  GLUCOSE 115*  CALCIUM 8.3*    Recent Labs  04/23/17 0305  INR 1.13   Alert and oriented x 3. Dressing is clean, dry, and intact.  Sensation intact distally Intact pulses distally Dorsiflexion/Plantar flexion intact  Assessment/Plan: 2 Days Post-Op Procedure(s) (LRB): OPEN REDUCTION INTERNAL FIXATION (ORIF) DISTAL FEMUR FRACTURE (FRACTURE AROUND FEMORAL NAIL) (Left) Up with therapy  - WBAT to the left lower extremity Continue with pain management as needed.  Continue with pulmonary toilet. Daily dressing changes. Orthopaedically stable for discharge to SNF  [facility] 04/25/2017, 9:52 AM

## 2017-04-25 NOTE — Clinical Social Work Note (Addendum)
Clinical Social Worker facilitated patient discharge including contacting patient family and facility to confirm patient discharge plans. Clinical information faxed to facility and family agreeable with plan. CSW arranged ambulance transport via [facility] to [place] to call report to [phone] prior to discharge.  Clinical Social Worker will sign off for now as social work intervention is no longer needed. Please consult [place] again if new need arises.  Sohaib Vereen B. Joline Maxcy Clinical Social Work Dept Weekend Social Worker [phone] 2:37 PM

## 2017-04-25 NOTE — Progress Notes (Signed)
Was able to get a hold of SNF and give report to nurse.

## 2017-04-25 NOTE — Clinical Social Work Note (Signed)
Clinical Social Work Assessment  Patient Details  Name: [name] MRN: [id number] Date of Birth: Sep 29, 1940  Date of referral:  04/25/17               Reason for consult:  Facility Placement                Permission sought to share information with:  Case Manager, Customer service manager Permission granted to share information::  Yes, Verbal Permission Granted  Name::     [name]  Agency::     Relationship::  Son  Contact Information:  [phone]  Housing/Transportation Living arrangements for the past 2 months:  [place] of Information:  Patient, Adult Children Patient Interpreter Needed:  None Criminal Activity/Legal Involvement Pertinent to Current Situation/Hospitalization:  No - Comment as needed Significant Relationships:  Adult Children Lives with:  Facility Resident Do you feel safe going back to the place where you live?  Yes Need for family participation in patient care:  Yes (Comment)  Care giving concerns:  Patient's son [name] reported that he would like for the patient to be reported to the [facility] at discharge. CSW informed the son that there are no current openings at [facility] and that patient will have to return back to [facility] [place]. Son expressed understanding and decided to make arrangements for her return.    Social Worker assessment / plan:  CSW spoke patient's son [name] regarding disposition plans. Patient and son have requested for patient to return back to [facility] currently known as [facility]. CSW informed patient and family that [place] will contact facility to explore any concerns regarding the patient returning. CSW will then fax over requested patient information.    Employment status:  Disabled (Comment on whether or not currently receiving Disability) Insurance information:  Medicare PT Recommendations:  [place] / Referral to community  resources:   (Patient from facility)  Patient/Family's Response to care:  Patient and family expressed understanding of plan  Patient/Family's Understanding of and Emotional Response to Diagnosis, Current Treatment, and Prognosis:  Family is understanding of emotional response to diagnosis and current treatment plan.  Emotional Assessment Appearance:  Appears stated age Attitude/Demeanor/Rapport:  Unable to Assess Affect (typically observed):  Unable to Assess Orientation:  Oriented to Self, Oriented to Place, Oriented to  Time, Oriented to Situation Alcohol / Substance use:  Not Applicable Psych involvement (Current and /or in the community):  No (Comment)  Discharge Needs  Concerns to be addressed:  Discharge Planning Concerns Readmission within the last 30 days:  No Current discharge risk:  Physical Impairment Barriers to Discharge:  Continued Medical Work up   [employer], [place] 04/25/2017, 11:44 AM

## 2017-04-25 NOTE — Discharge Summary (Addendum)
Physician Discharge Summary  [name] VAN:[id number] DOB: Jan 04, 1941 DOA: 04/21/2017  PCP: Redmond School, MD  Admit date: 04/21/2017 Discharge date: 04/25/2017  Time spent: > 35 minutes  Recommendations for Outpatient Follow-up:  1. Reassess hgb levels 2. Ensure f/u with ortho 3. Continue PT at SNF 4. Monitor blood sugar levels.   Discharge Diagnoses:  Principal Problem:   Closed left femoral fracture (HCC) Active Problems:   DM type 2 (diabetes mellitus, type 2) (HCC)   OSA (obstructive sleep apnea)   Aortic insufficiency   HTN (hypertension)   Lower urinary tract infectious disease   Seizure disorder [facility])   Discharge Condition: stable  Diet recommendation: Heart healthy  Filed Weights   04/23/17 0736  Weight: 59.4 kg (131 lb)    History of present illness:  76 year old female with PMH of anemia, mild-moderate AS & AI, bilateral carotid stenosis, cirrhosis due to NASH with esophageal varices & banding, CAD status post remote stent, cardiac cath 09/2016 showed only mild atherosclerosis/nonobstructive disease, PSVT, HTN, GERD, stroke, seizures, HLD, OSA, type II DM, s/p left femur IM nail 03/02/17 following which she was undergoing rehabilitation at [facility] in [place] and apparently doing well with ambulation and activities, presented to ED on 04/21/17 after a mechanical fall and sustained a left femur fracture  Hospital Course:  1. Complex left femur peri-implant fracture: Secondary to mechanical fall at SNF. S/p ORIF. DVT prophylaxis moving forward per Ortho. Will have nursing page ortho group to define this should it be warranted. 2. Diet-controlled DM 2: continue prior to admission medication regimen. 3. Anemia: Stable. Will continue ferrous sulfate. 4. Thrombocytopenia: Stable. Related to cirrhosis. No bleeding reported. Improved on last check to 142 5. Seizure disorder: Continue Dilantin. No recent seizures reported.  6. OSA: Not on CPAP. 7. Essential  hypertension: Stable, Continue carvedilol. 8. Acute cystitis: treated with 3 days of rocephin. 9. PSVT: Stable Continue carvedilol. 10. CAD, history of MI in 1995, status post 2 stents to RCA: Asymptomatic of exertional dyspnea or chest pain. Cardiac cath January 2018 showed nonobstructive CAD. Echo 10/2016: LVEF 65-70 percent. Continue aspirin and carvedilol. 11. Carotid stenosis: Continue aspirin. 12. NASH Cirrhosis complicated by esophageal varices & banding, thrombocytopenia: No bleeding reported. INR in June was 1.28. Continue folate. 13. GERD: stable contue, PPI.  Procedures:  ORIF  Consultations:  Orthopaedic surgery: Dr Marcelino Scot  Discharge Exam: Vitals:   04/25/17 0300 04/25/17 0852  BP: (!) 132/34 (!) 125/48  Pulse: 87 90  Resp: 16   Temp: 98.8 F (37.1 C)   SpO2: 98% 97%    General: Pt in nad, alert and awake Cardiovascular: rrr, no rubs Respiratory: no increased wob, no wheezes  Discharge Instructions   Discharge Instructions    Call MD for:  severe uncontrolled pain    Complete by:  As directed    Call MD for:  temperature >100.4    Complete by:  As directed    Diet - low sodium heart healthy    Complete by:  As directed    Discharge instructions    Complete by:  As directed    Please ensure f/u with orthopaedic surgeon   Increase activity slowly    Complete by:  As directed      Current Discharge Medication List    START taking these medications   Details  acetaminophen (TYLENOL) 325 MG tablet Take 2 tablets (650 mg total) by mouth every 6 (six) hours as needed for mild pain (or Fever >/= 101).  enoxaparin (LOVENOX) 30 MG/0.3ML injection Inject 0.3 mLs (30 mg total) into the skin daily. Qty: 0 Syringe      CONTINUE these medications which have NOT CHANGED   Details  Amino Acids-Protein Hydrolys (FEEDING SUPPLEMENT, PRO-STAT SUGAR FREE 64,) LIQD Take 30 mLs by mouth 2 (two) times daily.    aspirin 325 MG tablet Take 325 mg by mouth daily.      carvedilol (COREG) 12.5 MG tablet Take 12.5 mg by mouth 2 (two) times daily with a meal.     Cholecalciferol (VITAMIN D3) 2000 units TABS Take 2,000 Units by mouth daily.    ferrous sulfate 325 (65 FE) MG tablet Take 1 tablet (325 mg total) by mouth 3 (three) times daily after meals. Refills: 3    folic acid (FOLVITE) 1 MG tablet Take 1 tablet (1 mg total) by mouth daily.    ibuprofen (ADVIL) 200 MG tablet Take 200 mg by mouth every 6 (six) hours as needed for headache or moderate pain.     metFORMIN (GLUCOPHAGE) 500 MG tablet Take 500 mg by mouth daily with breakfast.    ondansetron (ZOFRAN) 4 MG tablet Take 4 mg by mouth every 8 (eight) hours as needed for nausea or vomiting.    pantoprazole (PROTONIX) 40 MG tablet Take 40 mg by mouth daily.    phenytoin (DILANTIN) 100 MG ER capsule Take 100 mg by mouth 2 (two) times daily.    polyethylene glycol (MIRALAX / GLYCOLAX) packet Take 17 g by mouth daily. Qty: 14 each, Refills: 0    potassium chloride SA (K-DUR,KLOR-CON) 20 MEQ tablet Take 1 tablet (20 mEq total) by mouth daily. Qty: 90 tablet, Refills: 3    senna-docusate (SENNA PLUS) 8.6-50 MG tablet Take 1 tablet by mouth 2 (two) times daily.    vitamin B-12 1000 MCG tablet Take 1 tablet (1,000 mcg total) by mouth daily.    vitamin C (ASCORBIC ACID) 500 MG tablet Take 500 mg by mouth 3 (three) times daily.      STOP taking these medications     docusate sodium (COLACE) 100 MG capsule        Allergies  Allergen Reactions  . Tape Rash and Other (See Comments)    Please use paper tape   Follow-up Information    Altamese Springport, MD. Schedule an appointment as soon as possible for a visit in 2 week(s).   Specialty:  Orthopedic Surgery Why:  follow up for hip fracture  Contact information: [place] [address] [phone]            The results of significant diagnostics from this hospitalization (including imaging, microbiology,  ancillary and laboratory) are listed below for reference.    Significant Diagnostic Studies: Chest Portable 1 View  Result Date: 04/21/2017 CLINICAL DATA:  Diabetes.  Preoperative chest x-ray. EXAM: PORTABLE CHEST 1 VIEW COMPARISON:  04/21/2017. FINDINGS: Mediastinum hilar structures normal. Heart size normal. No focal infiltrate. Stable mild left base pleural thickening most consistent with scar. Degenerative changes thoracic spine. IMPRESSION: No acute cardiopulmonary disease.  Mild left base pleural scarring. Electronically Signed   By: Marcello Moores  Register   On: 04/21/2017 07:40   Dg Chest Port 1 View  Result Date: 04/21/2017 CLINICAL DATA:  Preoperative chest radiograph for femur fracture. Initial encounter. EXAM: PORTABLE CHEST 1 VIEW COMPARISON:  Chest radiograph performed 02/28/2017 FINDINGS: The lungs are well-aerated and clear. There is no evidence of focal opacification, pleural effusion or pneumothorax. The cardiomediastinal silhouette  is within normal limits. No acute osseous abnormalities are seen. IMPRESSION: No acute cardiopulmonary process seen. No displaced rib fractures identified. Electronically Signed   By: Garald Balding M.D.   On: 04/21/2017 01:48   Dg C-arm 61-120 Min  Result Date: 04/23/2017 CLINICAL DATA:  Elective surgery. EXAM: LEFT FEMUR 2 VIEWS; DG C-ARM 61-120 MIN COMPARISON:  No prior. FINDINGS: ORIF left femur. Hardware intact. Anatomic alignment. No acute bony abnormality . IMPRESSION: ORIF left femur.  Anatomic alignment. Electronically Signed   By: Marcello Moores  Register   On: 04/23/2017 13:09   Dg Hip Port Unilat With Pelvis 1v Left  Result Date: 04/21/2017 CLINICAL DATA:  Preop, femur fracture EXAM: DG HIP (WITH OR WITHOUT PELVIS) 1V PORT LEFT COMPARISON:  04/21/2017, 03/02/2017 FINDINGS: Pubic symphysis is intact. The pubic rami appear normal. Partially visualized fixating rod in the proximal left femur. Comminuted intertrochanteric fracture with some callus formation  noted since the prior radiograph. Vascular calcification. IMPRESSION: Partially visualized left femoral fixating rod across comminuted intertrochanteric fracture with some bony callus noted since the prior study. Electronically Signed   By: Donavan Foil M.D.   On: 04/21/2017 02:26   Dg Femur Min 2 Views Left  Result Date: 04/23/2017 CLINICAL DATA:  Elective surgery. EXAM: LEFT FEMUR 2 VIEWS; DG C-ARM 61-120 MIN COMPARISON:  No prior. FINDINGS: ORIF left femur. Hardware intact. Anatomic alignment. No acute bony abnormality . IMPRESSION: ORIF left femur.  Anatomic alignment. Electronically Signed   By: Marcello Moores  Register   On: 04/23/2017 13:09   Dg Femur Min 2 Views Left  Result Date: 04/21/2017 CLINICAL DATA:  [place] and hit leg EXAM: LEFT FEMUR 2 VIEWS COMPARISON:  03/02/2017, 02/28/2017 FINDINGS: Status post internal fixation of remote left proximal femoral fracture. There is an acute fracture involving the distal shaft of the femur around the fixating rod, this demonstrates about 1/4 shaft diameter of medial displacement of the distal fracture fragment and about 1/3 shaft diameter of posterior displacement of distal fracture fragment. The surgical rod is displaced anteriorly and projects over the soft tissues of the distal anterior thigh. Vascular calcifications. Some healing changes are noted about the trochanteric fracture and the alignment here is grossly similar compared to the prior radiographs. IMPRESSION: 1. Status post left femoral rod and screw fixation of a prior left trochanteric fracture. Now seen is an acute displaced fracture involving the distal shaft of the femur. There is anterior displacement of the distal portion of the femoral rod which projects over the anterior soft tissues of the distal thigh/upper knee 2. Proximal femoral fracture grossly similar in alignment with some bony healing changes noted at the fracture site. Electronically Signed   By: Donavan Foil M.D.   On: 04/21/2017 01:09    Dg Femur Port Min 2 Views Left  Result Date: 04/23/2017 CLINICAL DATA:  Open reduction internal fixation. EXAM: LEFT FEMUR PORTABLE 2 VIEWS COMPARISON:  04/21/2017 . FINDINGS: ORIF left femur. Hardware intact. Anatomic alignment. Bony fragments noted over the suprapatellar space. Peripheral vascular calcification . IMPRESSION: 1. ORIF left femur. Anatomic alignment. Bony fragments noted over the suprapatellar space. 2. Peripheral vascular disease. Electronically Signed   By: Marcello Moores  Register   On: 04/23/2017 14:24    Microbiology: Recent Results (from the past 240 hour(s))  Urine culture     Status: Abnormal   Collection Time: 04/21/17  2:00 AM  Result Value Ref Range Status   Specimen Description URINE, CATHETERIZED  Final   Special Requests NONE  Final  Culture >=100,000 COLONIES/mL ESCHERICHIA COLI (A)  Final   Report Status 04/23/2017 FINAL  Final   Organism ID, Bacteria ESCHERICHIA COLI (A)  Final      Susceptibility   Escherichia coli - MIC*    AMPICILLIN >=32 RESISTANT Resistant     CEFAZOLIN 16 SENSITIVE Sensitive     CEFTRIAXONE <=1 SENSITIVE Sensitive     CIPROFLOXACIN <=0.25 SENSITIVE Sensitive     GENTAMICIN <=1 SENSITIVE Sensitive     IMIPENEM <=0.25 SENSITIVE Sensitive     NITROFURANTOIN <=16 SENSITIVE Sensitive     TRIMETH/SULFA <=20 SENSITIVE Sensitive     AMPICILLIN/SULBACTAM 16 INTERMEDIATE Intermediate     PIP/TAZO <=4 SENSITIVE Sensitive     Extended ESBL NEGATIVE Sensitive     * >=100,000 COLONIES/mL ESCHERICHIA COLI  Surgical pcr screen     Status: Abnormal   Collection Time: 04/21/17  8:01 AM  Result Value Ref Range Status   MRSA, PCR POSITIVE (A) NEGATIVE Final    Comment: RESULT CALLED TO, READ BACK BY AND VERIFIED WITH: Michele Rockers RN, AT 1021 04/21/17 BY D. VANHOOK    Staphylococcus aureus POSITIVE (A) NEGATIVE Final    Comment:        The Xpert SA Assay (FDA approved for NASAL specimens in patients over 66 years of age), is one component of a  comprehensive surveillance program.  Test performance has been validated by [facility] for patients greater than or equal to 53 year old. It is not intended to diagnose infection nor to guide or monitor treatment.      Labs: Basic Metabolic Panel:  Recent Labs Lab 04/21/17 0103 04/21/17 0621 04/22/17 0514 04/23/17 0305  NA 134* 135 141 137  K 4.3 3.9 4.7 4.5  CL 104 103 108 104  CO2 23 24 27 26   GLUCOSE 139* 115* 104* 115*  BUN 15 13 10 10   CREATININE 0.53 0.58 0.58 0.57  CALCIUM 8.5* 8.2* 8.4*  8.4* 8.3*  MG  --   --  1.7  --   PHOS  --   --  4.8*  --    Liver Function Tests:  Recent Labs Lab 04/21/17 0621 04/22/17 0514  AST 34 23  ALT 38 30  ALKPHOS 241* 226*  BILITOT 1.1 0.8  PROT 4.6* 4.6*  ALBUMIN 2.5* 2.4*   No results for input(s): LIPASE, AMYLASE in the last 168 hours. No results for input(s): AMMONIA in the last 168 hours. CBC:  Recent Labs Lab 04/21/17 0103 04/21/17 0621 04/22/17 0514 04/23/17 0305 04/24/17 0332 04/25/17 0216  WBC 9.9 8.0 6.1 7.1 12.5* 7.2  NEUTROABS 7.5  --  4.0 4.4 8.8*  --   HGB 10.7* 10.6* 9.8* 9.3* 9.4* 7.5*  HCT 31.2* 31.8* 30.2* 28.0* 27.7* 21.8*  MCV 92.3 91.9 93.2 93.0 90.2 89.7  PLT 129* 137* 115* 118* 142* 98*   Cardiac Enzymes: No results for input(s): CKTOTAL, CKMB, CKMBINDEX, TROPONINI in the last 168 hours. BNP: BNP (last 3 results) No results for input(s): BNP in the last 8760 hours.  ProBNP (last 3 results) No results for input(s): PROBNP in the last 8760 hours.  CBG:  Recent Labs Lab 04/24/17 0626 04/24/17 1159 04/24/17 1643 04/24/17 2126 04/25/17 0416  GLUCAP 132* 184* 136* 119* 125*    Signed:  Velvet Bathe MD.  [facility] 04/25/2017, 11:57 AM

## 2017-04-25 NOTE — Plan of Care (Signed)
Problem: Safety: Goal: Ability to remain free from injury will improve Patient's belongings and call light in reach. Patient calls for assistance when needed

## 2017-04-26 LAB — TYPE AND SCREEN
ABO/RH(D): A POS
ANTIBODY SCREEN: NEGATIVE
UNIT DIVISION: 0

## 2017-04-26 LAB — BPAM RBC
Blood Product Expiration Date: 201809032359
ISSUE DATE / TIME: 201808181343
Unit Type and Rh: 6200

## 2017-04-27 ENCOUNTER — Ambulatory Visit: Payer: Self-pay | Admitting: Cardiology

## 2017-04-27 ENCOUNTER — Ambulatory Visit: Payer: Self-pay | Admitting: Adult Health

## 2017-04-27 DIAGNOSIS — D509 Iron deficiency anemia, unspecified: Secondary | ICD-10-CM | POA: Diagnosis not present

## 2017-04-27 DIAGNOSIS — S72452D Displaced supracondylar fracture without intracondylar extension of lower end of left femur, subsequent encounter for closed fracture with routine healing: Secondary | ICD-10-CM | POA: Diagnosis not present

## 2017-04-27 DIAGNOSIS — E46 Unspecified protein-calorie malnutrition: Secondary | ICD-10-CM | POA: Diagnosis not present

## 2017-04-27 DIAGNOSIS — R569 Unspecified convulsions: Secondary | ICD-10-CM | POA: Diagnosis not present

## 2017-04-29 ENCOUNTER — Ambulatory Visit: Payer: Self-pay | Admitting: *Deleted

## 2017-04-29 ENCOUNTER — Other Ambulatory Visit: Payer: Self-pay | Admitting: *Deleted

## 2017-04-29 NOTE — Patient Outreach (Signed)
[facility]) Care Management  04/29/2017  [name] 04-30-1941 [id number]   CSW met with patient at [facility] at [facility] where she returned on 04/25/17 from [employer] [facility] after having surgery for left femoral fracture she sustained while at [facility] on 04/21/17. Patient reports that she is doing well and participating with therapy - she anticipates that she will be at [facility] for the next 2 months before returning home.   CSW met with SNF Social Worker - Loa Socks to inform of [facility] involvement and will coordinate discharge plans when ready.   CSW will check back with patient within 3 weeks to ensure progress with therapy and provide support.    Raynaldo Opitz, LCSW [facility]  Clinical Social Worker cell #: [phone]

## 2017-04-30 DIAGNOSIS — R569 Unspecified convulsions: Secondary | ICD-10-CM | POA: Diagnosis not present

## 2017-04-30 DIAGNOSIS — G4700 Insomnia, unspecified: Secondary | ICD-10-CM | POA: Diagnosis not present

## 2017-04-30 DIAGNOSIS — S72452D Displaced supracondylar fracture without intracondylar extension of lower end of left femur, subsequent encounter for closed fracture with routine healing: Secondary | ICD-10-CM | POA: Diagnosis not present

## 2017-04-30 DIAGNOSIS — E46 Unspecified protein-calorie malnutrition: Secondary | ICD-10-CM | POA: Diagnosis not present

## 2017-05-07 ENCOUNTER — Ambulatory Visit: Payer: Self-pay | Admitting: Adult Health

## 2017-05-07 NOTE — Progress Notes (Deleted)
Cardiology Office Note   Date:  05/07/2017   ID:  [name], DOB 07-18-1941, MRN [id number]  PCP:  Redmond School, MD  Cardiologist:  Lennie Muckle chief complaint on file.     History of Present Illness: [name] is a 76 y.o. female who presents for ongoing assessment and management of PSVT, admitted to the hospital in June 2018 with elevated troponin thought to be demand ischemia related to elevated heart rate, carotid artery stenosis, and cryptogenic cirrhosis followed by GI.  She was recently discharged from the hospital on 04/25/2017 after admission for closed left femoral fracture, other history includes diabetes, OSA, hypertension, anemia, and seizure disorder.   Past Medical History:  Diagnosis Date  . Anemia   . Aortic insufficiency    Moderate  . Back pain   . Carotid stenosis, bilateral   . Cirrhosis ([place])   . Closed left femoral fracture ([place]) 04/21/2017  . Coronary artery disease    Stent x 2 RCA 1995, cardiac catheterization 09/2016 showing only mild atherosclerosis  . DDD (degenerative disc disease), lumbar   . Essential hypertension   . GERD (gastroesophageal reflux disease)   . Hiatal hernia   . History of stroke   . Hypercholesteremia   . IBS (irritable bowel syndrome)   . Myocardial infarction ([place]) 1995  . Non-alcoholic fatty liver disease   . OSA (obstructive sleep apnea)    Sleep study 2009  AHI 9.91/hr and during REM sleep 35.58/hr  . Pericardial effusion    a. small by echo 2018.  Marland Kitchen PSVT (paroxysmal supraventricular tachycardia) ([place])   . Recurrent UTI   . Type 2 diabetes mellitus ([place])   . Varices, esophageal ([place])     Past Surgical History:  Procedure Laterality Date  . [place]  . APPENDECTOMY    . BACK SURGERY  1985  . CARDIAC CATHETERIZATION  [phone]   Stent to the proximal RCA after MI   . CARDIAC CATHETERIZATION N/A 09/26/2016   Procedure: Left Heart Cath and Coronary Angiography;  Surgeon: Leonie Man, MD;  Location: [place] [facility];  Service: Cardiovascular;  Laterality: N/A;  . CATARACT EXTRACTION W/PHACO Right 07/04/2014   Procedure: CATARACT EXTRACTION PHACO AND INTRAOCULAR LENS PLACEMENT (IOC);  Surgeon: Elta Guadeloupe T. Gershon Crane, MD;  Location: [facility];  Service: Ophthalmology;  Laterality: Right;  CDE:13.13  . COLONOSCOPY    . DILATION AND CURETTAGE OF UTERUS     x2  . Epi Retinal Membrane Peel Left   . ERCP    . ESOPHAGEAL BANDING N/A 04/01/2013   Procedure: ESOPHAGEAL BANDING;  Surgeon: Rogene Houston, MD;  Location: [facility];  Service: Endoscopy;  Laterality: N/A;  . ESOPHAGEAL BANDING N/A 05/24/2013   Procedure: ESOPHAGEAL BANDING;  Surgeon: Rogene Houston, MD;  Location: [facility];  Service: Endoscopy;  Laterality: N/A;  . ESOPHAGEAL BANDING N/A 06/21/2014   Procedure: ESOPHAGEAL BANDING;  Surgeon: Rogene Houston, MD;  [facility];  Service: Endoscopy;  Laterality: N/A;  . ESOPHAGOGASTRODUODENOSCOPY N/A 04/01/2013   Procedure: ESOPHAGOGASTRODUODENOSCOPY (EGD);  Surgeon: Rogene Houston, MD;  [facility];  Service: Endoscopy;  Laterality: N/A;  230-rescheduled to 8:30am Ann notified pt  . ESOPHAGOGASTRODUODENOSCOPY N/A 05/24/2013   Procedure: ESOPHAGOGASTRODUODENOSCOPY (EGD);  Surgeon: Rogene Houston, MD;  [facility];  Service: Endoscopy;  Laterality: N/A;  730  . ESOPHAGOGASTRODUODENOSCOPY N/A 06/21/2014   Procedure: ESOPHAGOGASTRODUODENOSCOPY (EGD);  Surgeon: Rogene Houston, MD;  [facility]  [facility];  Service: Endoscopy;  Laterality: N/A;  930-rescheduled 10/14 @ 1200 Ann to notify pt  . EYE SURGERY  08   cataract surgery of the left eye  . FEMUR IM NAIL Left 03/02/2017   Procedure: INTRAMEDULLARY (IM) NAIL FEMORAL;  Surgeon: Rod Can, MD;  Location: [facility];  Service: Orthopedics;  Laterality: Left;  . HARDWARE REMOVAL Right 01/17/2013   Procedure: REMOVAL OF HARDWARE AND EXCISION ULNAR STYLOID RIGHT WRIST;  Surgeon:  Tennis Must, MD;  Location: [facility];  Service: Orthopedics;  Laterality: Right;  . MYRINGOTOMY  2012   both ears  . ORIF FEMUR FRACTURE Left 04/23/2017   Procedure: OPEN REDUCTION INTERNAL FIXATION (ORIF) DISTAL FEMUR FRACTURE (FRACTURE AROUND FEMORAL NAIL);  Surgeon: Altamese [place], MD;  Location: [facility];  Service: Orthopedics;  Laterality: Left;  . TONSILLECTOMY    . VAGINAL HYSTERECTOMY  1972  . WRIST SURGERY     rt wrist hardwear removal     Current Outpatient Prescriptions  Medication Sig Dispense Refill  . acetaminophen (TYLENOL) 325 MG tablet Take 2 tablets (650 mg total) by mouth every 6 (six) hours as needed for mild pain (or Fever >/= 101).    . Amino Acids-Protein Hydrolys (FEEDING SUPPLEMENT, PRO-STAT SUGAR FREE 64,) LIQD Take 30 mLs by mouth 2 (two) times daily.    Marland Kitchen aspirin 325 MG tablet Take 325 mg by mouth daily.     . carvedilol (COREG) 12.5 MG tablet Take 12.5 mg by mouth 2 (two) times daily with a meal.     . Cholecalciferol (VITAMIN D3) 2000 units TABS Take 2,000 Units by mouth daily.    Marland Kitchen enoxaparin (LOVENOX) 30 MG/0.3ML injection Inject 0.3 mLs (30 mg total) into the skin daily. 0 Syringe   . ferrous sulfate 325 (65 FE) MG tablet Take 1 tablet (325 mg total) by mouth 3 (three) times daily after meals.  3  . folic acid (FOLVITE) 1 MG tablet Take 1 tablet (1 mg total) by mouth daily.    Marland Kitchen ibuprofen (ADVIL) 200 MG tablet Take 200 mg by mouth every 6 (six) hours as needed for headache or moderate pain.     . metFORMIN (GLUCOPHAGE) 500 MG tablet Take 500 mg by mouth daily with breakfast.    . ondansetron (ZOFRAN) 4 MG tablet Take 4 mg by mouth every 8 (eight) hours as needed for nausea or vomiting.    . pantoprazole (PROTONIX) 40 MG tablet Take 40 mg by mouth daily.    . phenytoin (DILANTIN) 100 MG ER capsule Take 100 mg by mouth 2 (two) times daily.    . polyethylene glycol (MIRALAX / GLYCOLAX) packet Take 17 g by mouth daily. 14 each 0  . potassium  chloride SA (K-DUR,KLOR-CON) 20 MEQ tablet Take 1 tablet (20 mEq total) by mouth daily. 90 tablet 3  . senna-docusate (SENNA PLUS) 8.6-50 MG tablet Take 1 tablet by mouth 2 (two) times daily.    . vitamin B-12 1000 MCG tablet Take 1 tablet (1,000 mcg total) by mouth daily.    . vitamin C (ASCORBIC ACID) 500 MG tablet Take 500 mg by mouth 3 (three) times daily.     No current facility-administered medications for this visit.     Allergies:   Tape    Social History:  The patient  reports that she quit smoking about 5 years ago. Her smoking use included Cigarettes. She has a 12.50 pack-year smoking history. She has never used smokeless tobacco. She  reports that she does not drink alcohol or use drugs.   Family History:  The patient's family history includes Diabetes in her mother; Heart failure in her father and maternal aunt.    ROS: All other systems are reviewed and negative. Unless otherwise mentioned in H&P    PHYSICAL EXAM: VS:  There were no vitals taken for this visit. , BMI There is no height or weight on file to calculate BMI. GEN: Well nourished, well developed, in no acute distress HEENT: normal Neck: no JVD, carotid bruits, or masses Cardiac: ***RRR; no murmurs, rubs, or gallops,no edema  Respiratory:  clear to auscultation bilaterally, normal work of breathing GI: soft, nontender, nondistended, + BS MS: no deformity or atrophy Skin: warm and dry, no rash Neuro:  Strength and sensation are intact Psych: euthymic mood, full affect   EKG:  EKG {ACTION; IS/IS [id number]} ordered today. The ekg ordered today demonstrates ***   Recent Labs: 04/22/2017: ALT 30; Magnesium 1.7; TSH 2.288 04/23/2017: BUN 10; Creatinine, Ser 0.57; Potassium 4.5; Sodium 137 04/25/2017: Hemoglobin 7.5; Platelets 98    Lipid Panel    Component Value Date/Time   CHOL 98 03/02/2017 0559   CHOL 142 12/07/2013 1038   TRIG 78 03/02/2017 0559   TRIG 98 12/07/2013 1038   HDL 39 (L) 03/02/2017  0559   HDL 47 12/07/2013 1038   CHOLHDL 2.5 03/02/2017 0559   VLDL 16 03/02/2017 0559   LDLCALC 43 03/02/2017 0559   LDLCALC 75 12/07/2013 1038      Wt Readings from Last 3 Encounters:  04/23/17 131 lb (59.4 kg)  03/23/17 131 lb (59.4 kg)  02/28/17 130 lb 1.1 oz (59 kg)      Other studies Reviewed: Additional studies/ records that were reviewed today include: ***. Review of the above records demonstrates: ***   ASSESSMENT AND PLAN:  1.  ***   Current medicines are reviewed at length with the patient today.    Labs/ tests ordered today include: *** Phill Myron. [place] Pugh, ANP, AACC   05/07/2017 7:48 AM    [facility] [address], [place], [place] [address] Phone: [phone]; Fax: [phone]

## 2017-05-13 DIAGNOSIS — T84115D Breakdown (mechanical) of internal fixation device of left femur, subsequent encounter: Secondary | ICD-10-CM | POA: Diagnosis not present

## 2017-05-13 DIAGNOSIS — S72352D Displaced comminuted fracture of shaft of left femur, subsequent encounter for closed fracture with routine healing: Secondary | ICD-10-CM | POA: Diagnosis not present

## 2017-05-13 DIAGNOSIS — S72452D Displaced supracondylar fracture without intracondylar extension of lower end of left femur, subsequent encounter for closed fracture with routine healing: Secondary | ICD-10-CM | POA: Diagnosis not present

## 2017-05-20 ENCOUNTER — Other Ambulatory Visit: Payer: Self-pay | Admitting: *Deleted

## 2017-05-20 IMAGING — US US ABDOMEN COMPLETE
1 series · 14 of 25 positions shown · non-contrast
Comparison: CT 07/09/2015.

CLINICAL DATA: Cirrhosis.

EXAM:
ABDOMEN ULTRASOUND COMPLETE

[Series 1: us abdomen complete · 0.16mm/px · 14 of 101 slices shown]
[im 1/101]
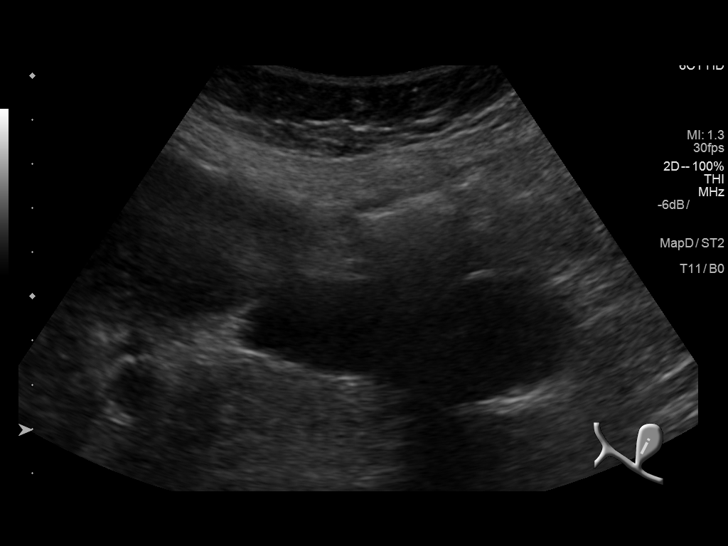
[im 9/101]
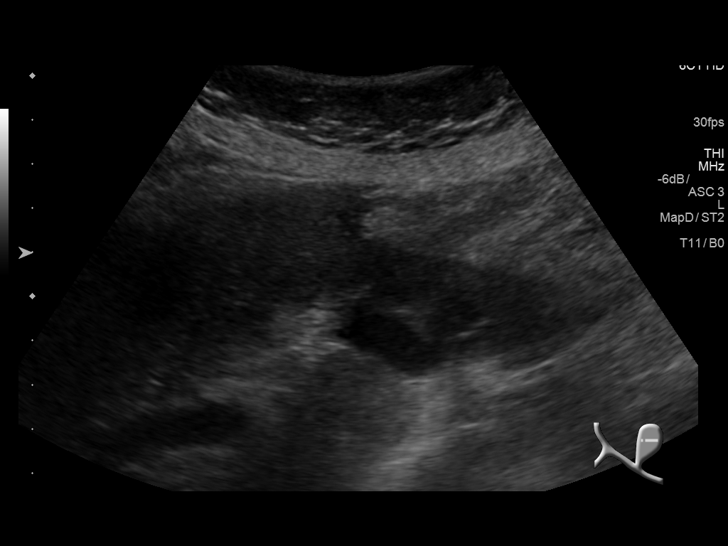
[im 17/101]
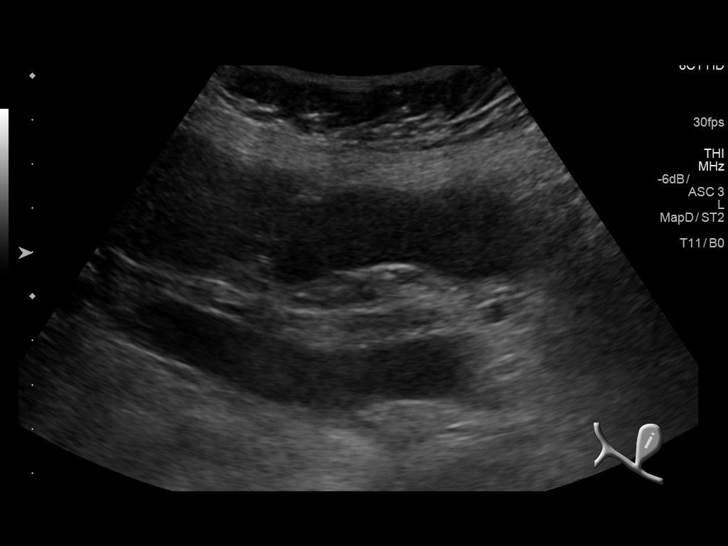
[im 26/101]
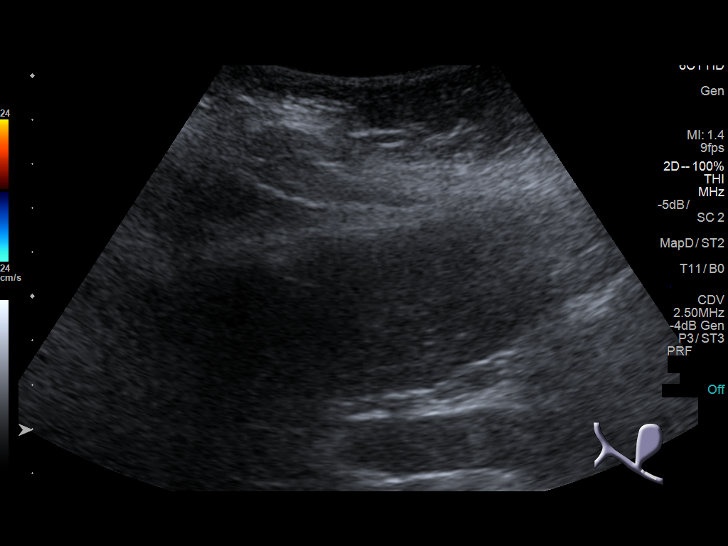
[im 34/101]
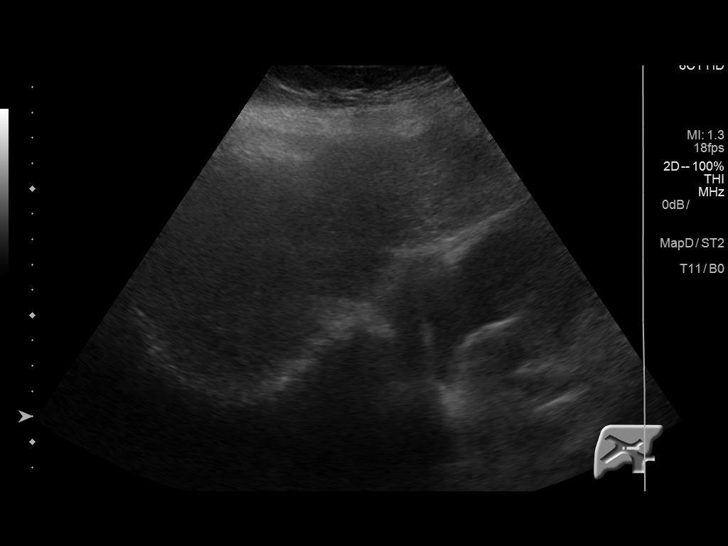
[im 38/101]
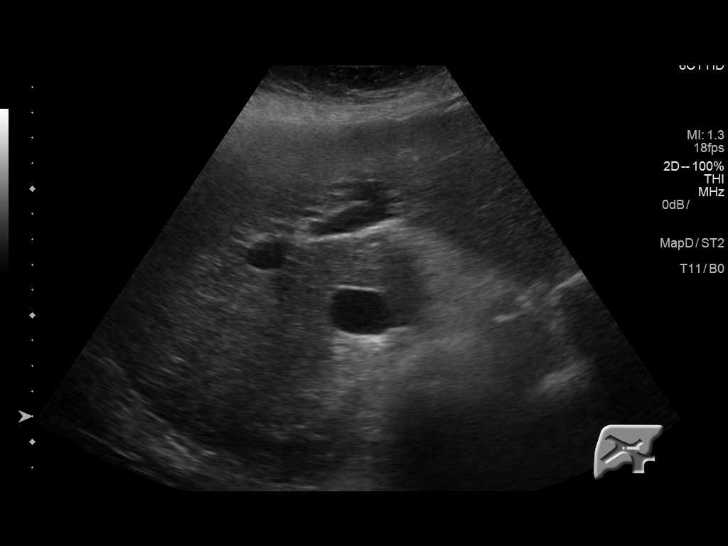
[im 46/101]
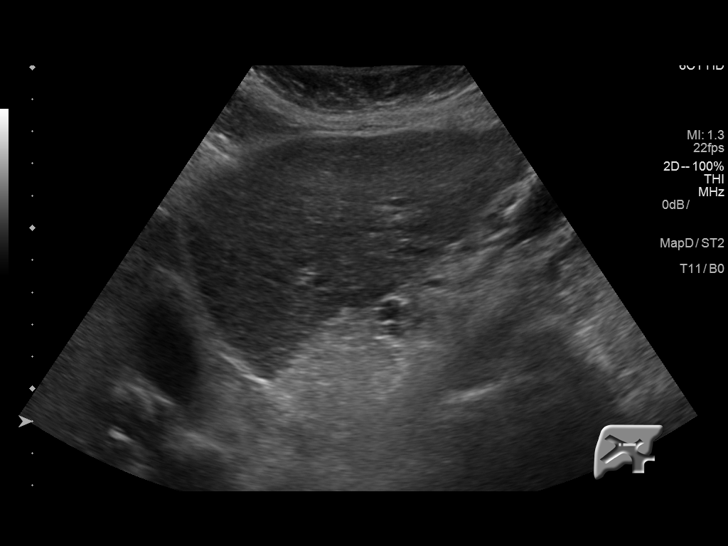
[im 55/101]
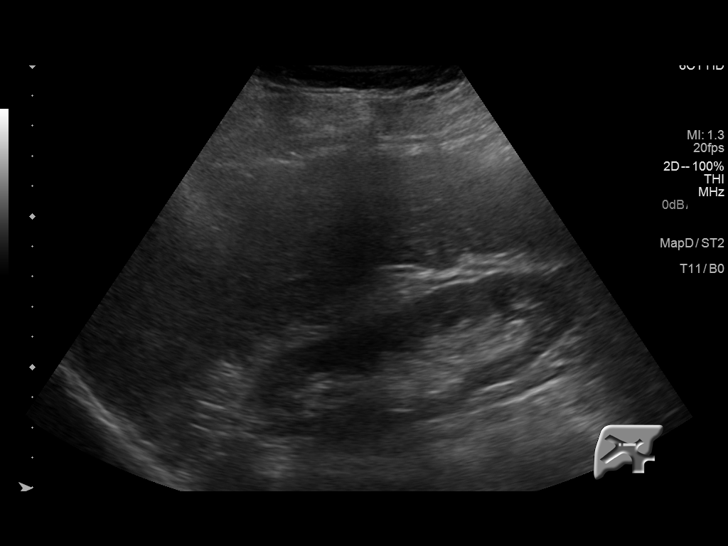
[im 63/101]
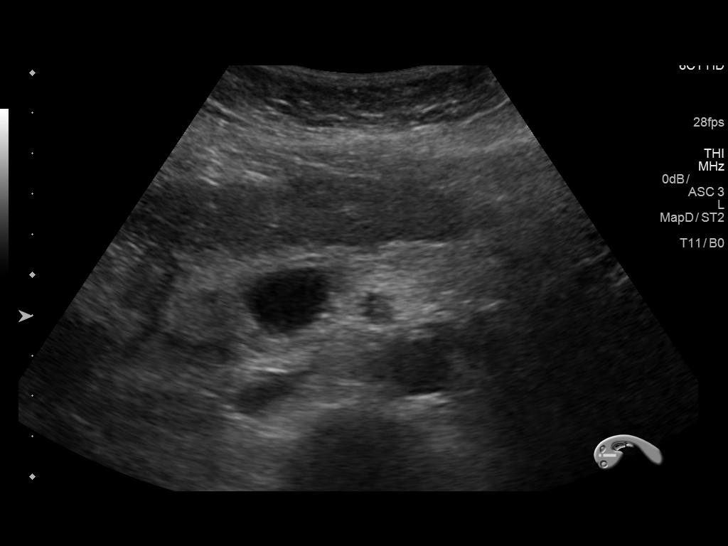
[im 67/101]
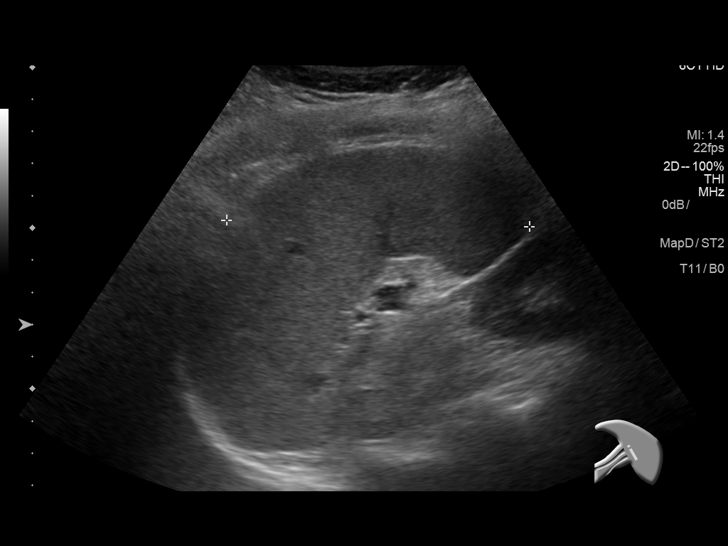
[im 76/101]
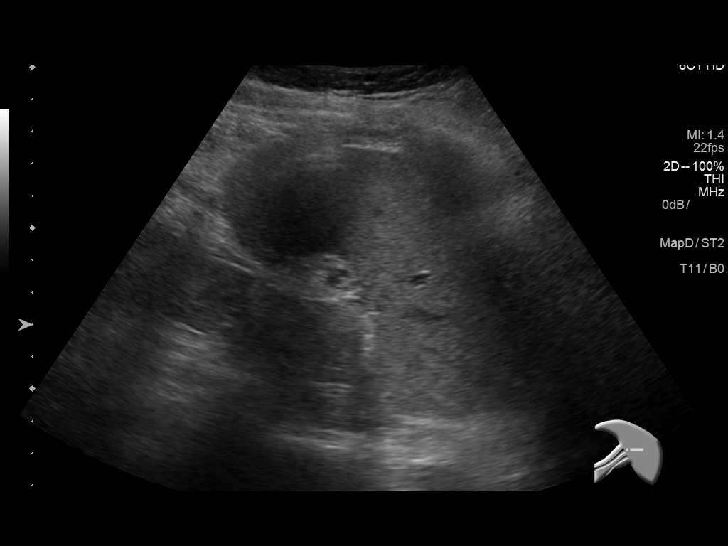
[im 84/101]
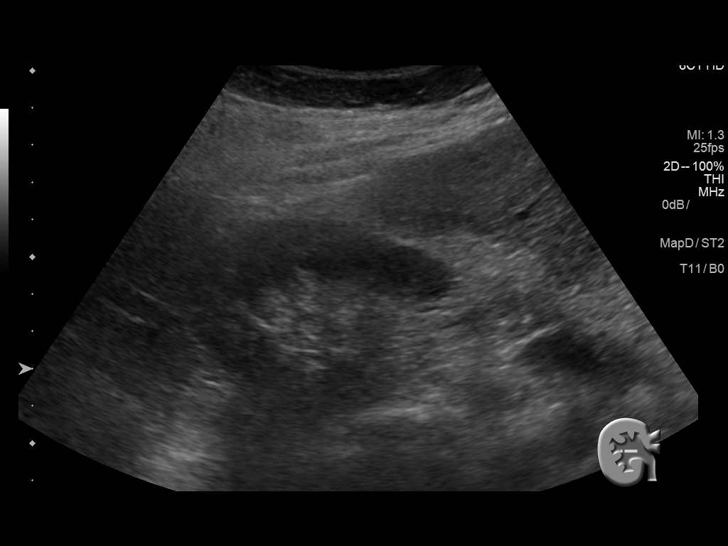
[im 92/101]
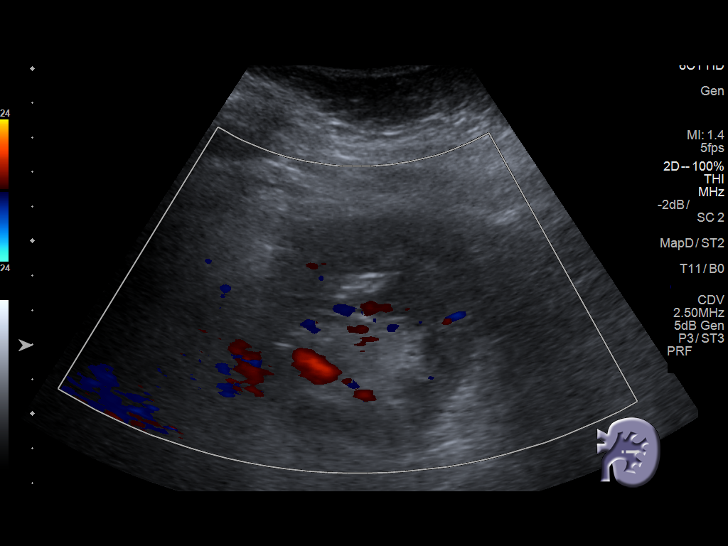
[im 101/101]
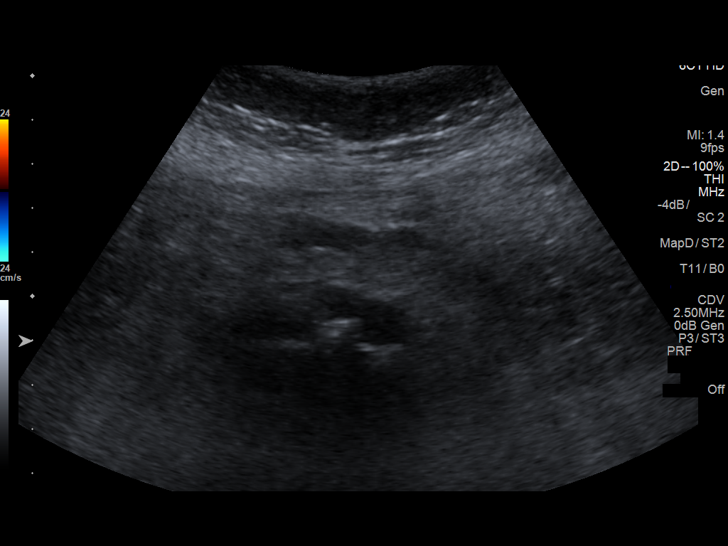

[14 of 25 positions shown; findings below may reference images not displayed]

FINDINGS: Gallbladder: No gallstones or wall thickening visualized. No
sonographic Murphy sign noted by sonographer.

Common bile duct: Diameter:  7.1 mm

Liver: Liver echotexture is coarse and nodular consistent patient's
known cirrhosis. No focal abnormality identified.

IVC: No abnormality visualized.

Pancreas: Visualized portion unremarkable.

Spleen: Size and appearance within normal limits.

Right Kidney: Length: 12.7 cm. Echogenicity within normal limits. No
mass or hydronephrosis visualized.

Left Kidney: Length: 12.5 cm. Echogenicity within normal limits. No
mass or hydronephrosis visualized.

Abdominal aorta: No aneurysm visualized.

Other findings: None.
IMPRESSION: 1. Liver echotexture is coarse and nodular consistent with patient's
known cirrhosis.

2. No gallstones noted. However tiny gallstones were noted on prior
CT of 07/09/2015. Common bile duct is slightly prominent 7.1 mm. No
obstructing lesion is identified. Correlate with LFTs.

## 2017-05-20 NOTE — Patient Outreach (Signed)
[place] [facility]) Care Management  05/20/2017  [name] 1941-05-29 [id number]   CSW met with patient at [facility] (room A1, bed 2) today. Patient reports that she has been progressing well with therapy but is unsure how much longer she anticipates being at rehab. Patient is looking forward to returning home. CSW will check back within 2 weeks.    Raynaldo Opitz, LCSW [facility]  Clinical Social Worker cell #: [phone]

## 2017-05-26 IMAGING — DX DG CHEST 2V
2 series · 2 of 2 positions shown · non-contrast
Comparison: 09/23/2016

CLINICAL DATA: CHEST PAIN, SMOKER, Smoker, with N/V last night,
this am CP to L Chest HISTORY OF DM, HTN, MI, STROKE, CAD, GERD

EXAM:
CHEST  2 VIEW

[chest pa]
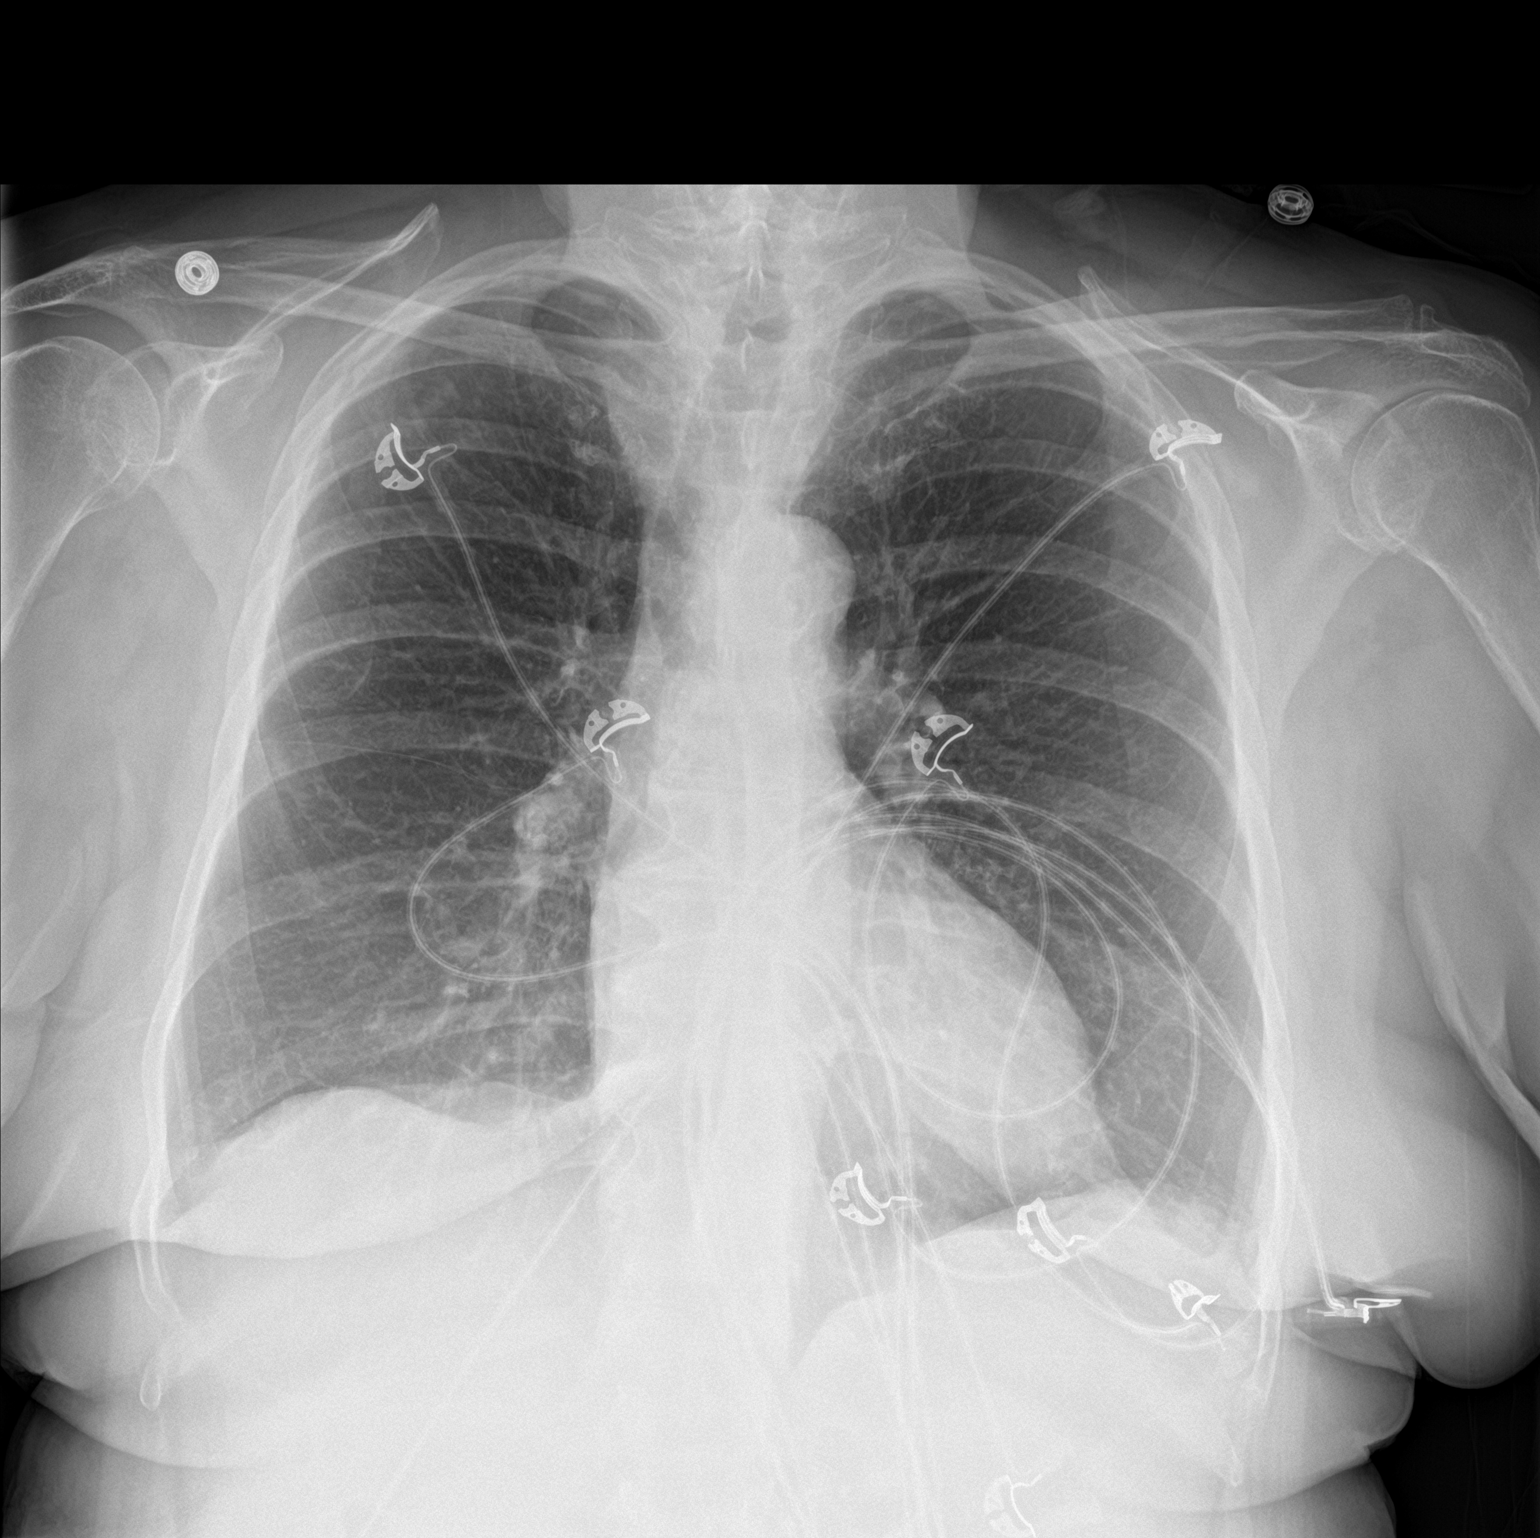

[chest lat]
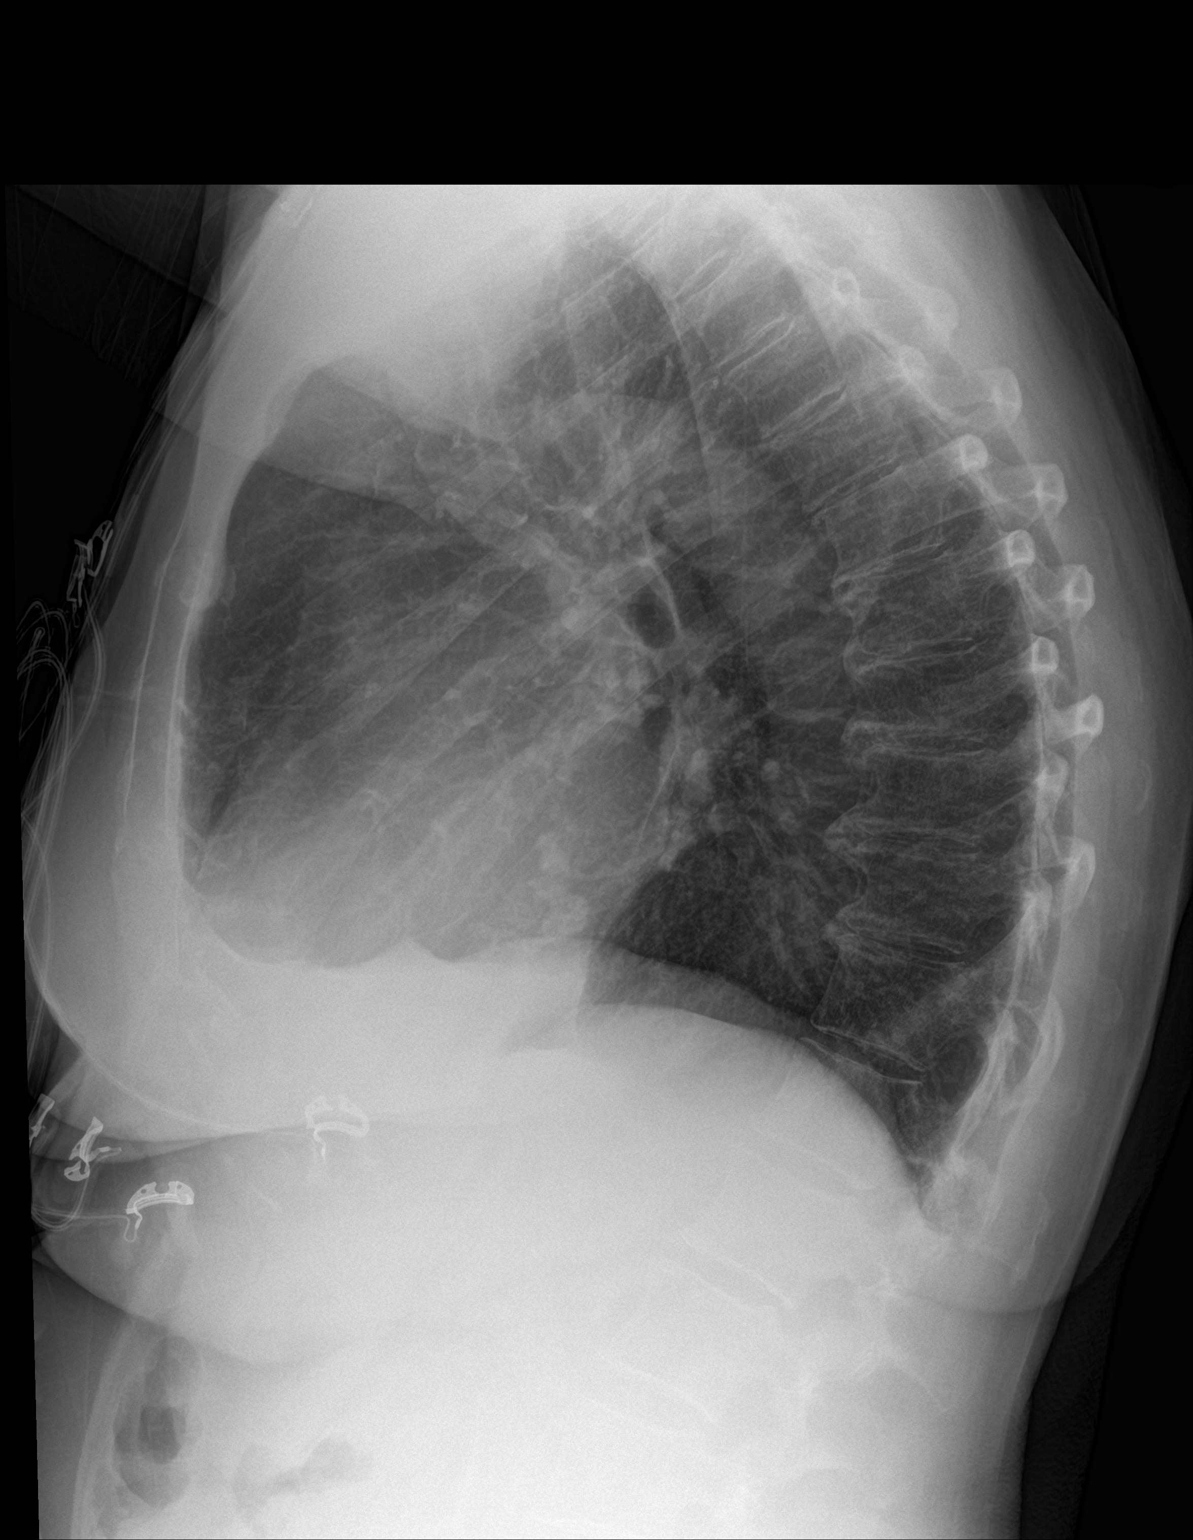

[2 of 2 positions shown; findings below may reference images not displayed]

FINDINGS: Cardiac silhouette is normal in size and configuration. No
mediastinal or hilar masses. No evidence of adenopathy.

Lungs are mildly hyperexpanded but clear.

No pleural effusion.  No pneumothorax.

Skeletal structures are demineralized but intact.
IMPRESSION: No active cardiopulmonary disease.

## 2017-06-01 IMAGING — DX DG CHEST 2V
2 series · 2 of 2 positions shown · non-contrast
Comparison: Chest radiograph performed 11/02/2016

CLINICAL DATA: Acute onset of heart palpitations. Initial
encounter.

EXAM:
CHEST  2 VIEW

[chest lat]
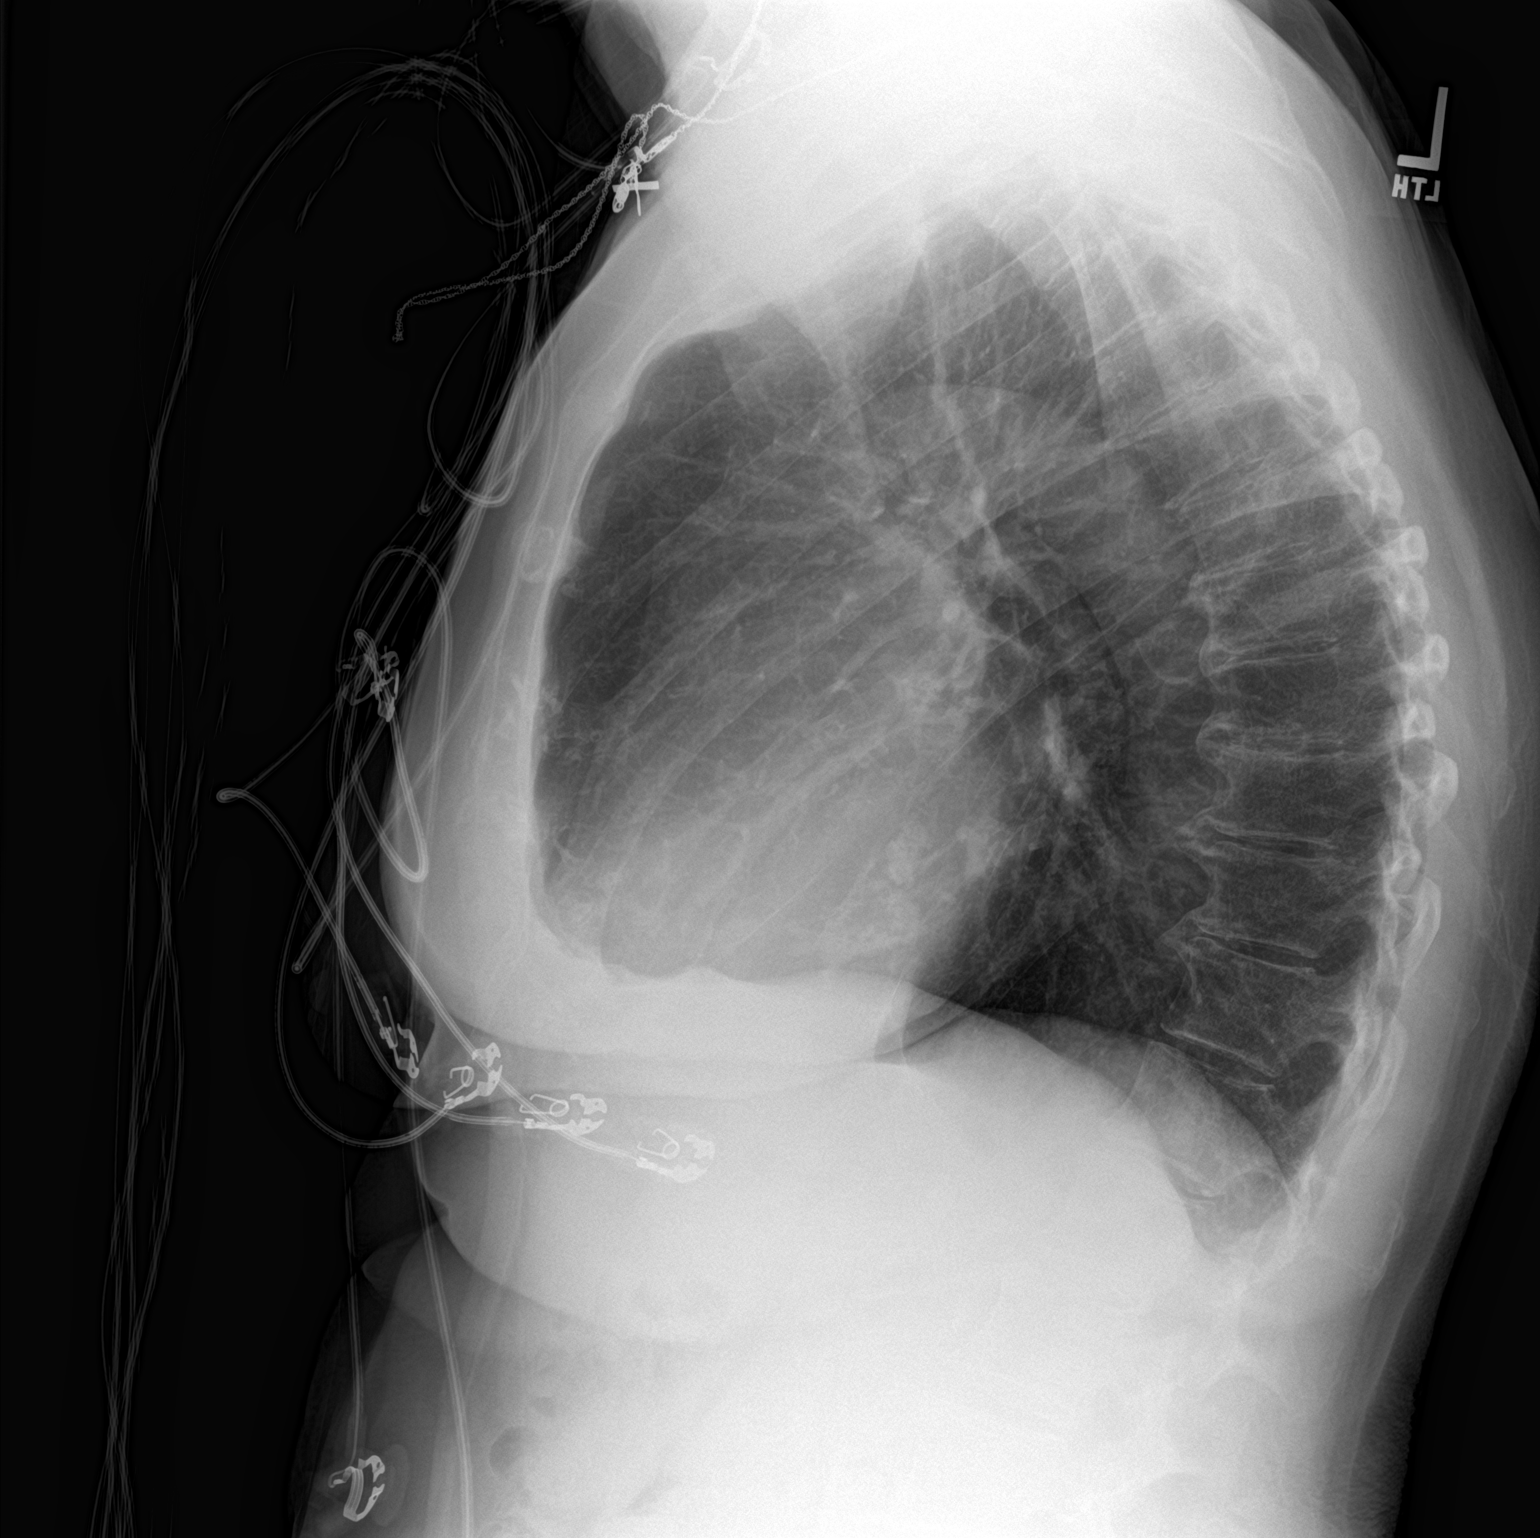

[chest pa]
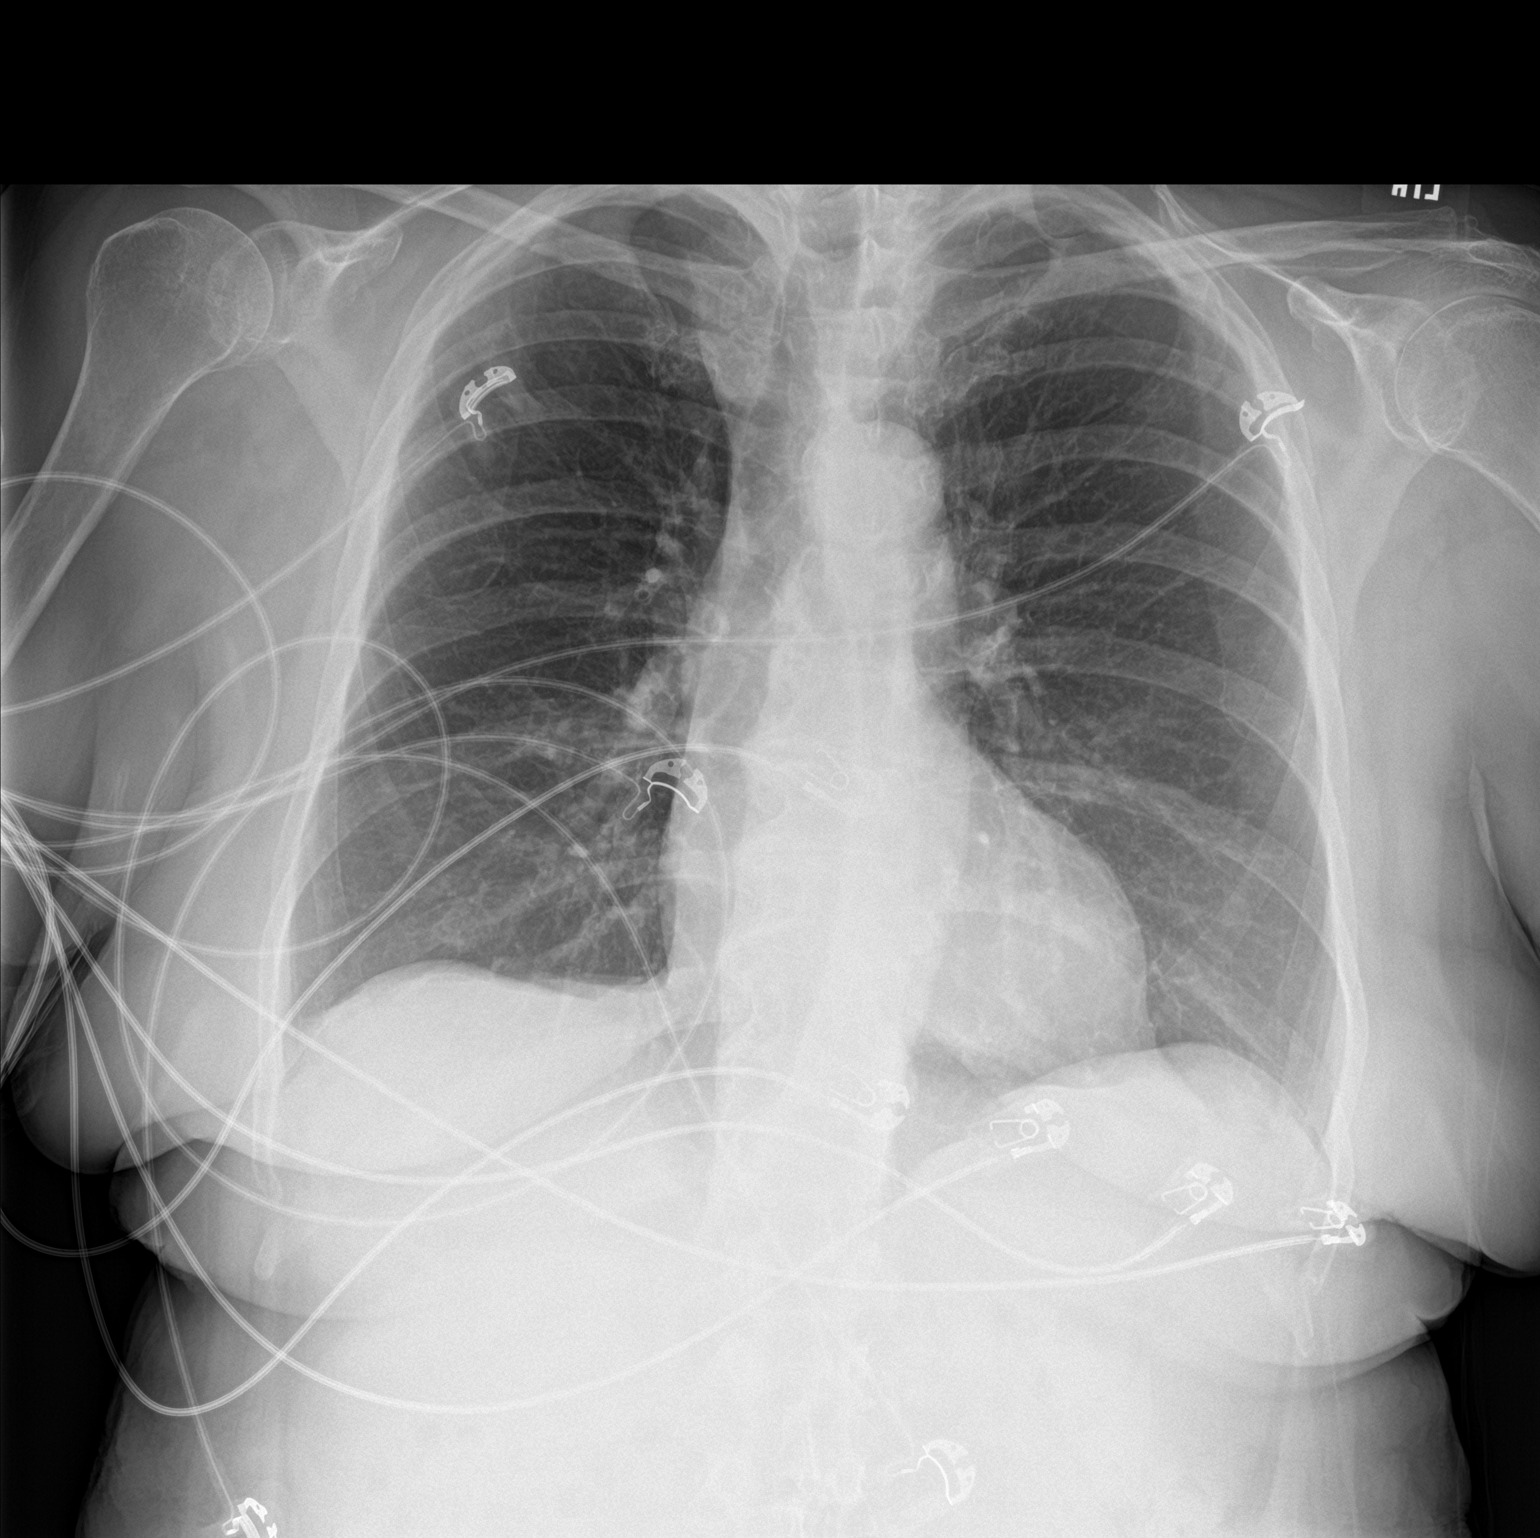

[2 of 2 positions shown; findings below may reference images not displayed]

FINDINGS: The lungs are well-aerated and clear. There is no evidence of focal
opacification, pleural effusion or pneumothorax.

The heart is normal in size; the mediastinal contour is within
normal limits. No acute osseous abnormalities are seen.
IMPRESSION: No acute cardiopulmonary process seen.

## 2017-06-02 ENCOUNTER — Other Ambulatory Visit: Payer: Self-pay | Admitting: *Deleted

## 2017-06-02 NOTE — Patient Outreach (Signed)
[facility]) Care Management  06/02/2017  [name] Jan 31, 1941 [id number]   [place] visited with patient at [facility]. Patient states that she is looking forward to her appointment this Thursday with Dr. Marcelino Scot to see if he clears her to return home. Patient reports that she has been doing well with therapy - standing and taking a few steps, she states that she feels comfortable returning home if he does clear her.   CSW provided patient with CSW business card & encouraged her to call CSW after visit to relay her discharge date so CSW can make proper referral to [place] for transitions of care. CSW will follow-up with patient on Mon if no call from patient.    Raynaldo Opitz, LCSW [facility]  Clinical Social Worker cell #: [phone]

## 2017-06-03 ENCOUNTER — Ambulatory Visit: Payer: Self-pay | Admitting: *Deleted

## 2017-06-03 DIAGNOSIS — T84115D Breakdown (mechanical) of internal fixation device of left femur, subsequent encounter: Secondary | ICD-10-CM | POA: Diagnosis not present

## 2017-06-03 DIAGNOSIS — S72452D Displaced supracondylar fracture without intracondylar extension of lower end of left femur, subsequent encounter for closed fracture with routine healing: Secondary | ICD-10-CM | POA: Diagnosis not present

## 2017-06-03 DIAGNOSIS — S72352D Displaced comminuted fracture of shaft of left femur, subsequent encounter for closed fracture with routine healing: Secondary | ICD-10-CM | POA: Diagnosis not present

## 2017-06-08 ENCOUNTER — Other Ambulatory Visit: Payer: Self-pay | Admitting: *Deleted

## 2017-06-08 NOTE — Patient Outreach (Signed)
[name]  06/08/2017  [name] 1941-04-26 [id number]   CSW went to [facility] to visit patient, but patient was asleep in her bed. Patient's roommate reports that she had just returned from therapy. CSW spoke with SNF social worker, Clarene Critchley who informed CSW that patient did have a follow-up appointment with Dr. Marcelino Scot on Friday but that she is not quite ready for discharge. Clarene Critchley to call CSW closer to discharge, but will likely be 2-3 more weeks.   CSW will follow-up in 2 weeks if no call from [place].    Raynaldo Opitz, LCSW [facility]  Clinical Social Worker cell #: [phone]

## 2017-06-17 ENCOUNTER — Ambulatory Visit (INDEPENDENT_AMBULATORY_CARE_PROVIDER_SITE_OTHER): Payer: Self-pay | Admitting: Internal Medicine

## 2017-06-18 DIAGNOSIS — R2681 Unsteadiness on feet: Secondary | ICD-10-CM | POA: Diagnosis not present

## 2017-06-18 DIAGNOSIS — M4844XA Fatigue fracture of vertebra, thoracic region, initial encounter for fracture: Secondary | ICD-10-CM | POA: Diagnosis not present

## 2017-06-18 DIAGNOSIS — R262 Difficulty in walking, not elsewhere classified: Secondary | ICD-10-CM | POA: Diagnosis not present

## 2017-06-18 DIAGNOSIS — M6281 Muscle weakness (generalized): Secondary | ICD-10-CM | POA: Diagnosis not present

## 2017-06-18 DIAGNOSIS — Z9181 History of falling: Secondary | ICD-10-CM | POA: Diagnosis not present

## 2017-06-18 DIAGNOSIS — M4844XD Fatigue fracture of vertebra, thoracic region, subsequent encounter for fracture with routine healing: Secondary | ICD-10-CM | POA: Diagnosis not present

## 2017-06-18 DIAGNOSIS — S72452D Displaced supracondylar fracture without intracondylar extension of lower end of left femur, subsequent encounter for closed fracture with routine healing: Secondary | ICD-10-CM | POA: Diagnosis not present

## 2017-06-23 ENCOUNTER — Other Ambulatory Visit: Payer: Self-pay | Admitting: *Deleted

## 2017-06-23 NOTE — Patient Outreach (Signed)
[place] [name]  06/23/2017  [name] December 31, 1940 [id number]   CSW had went to [facility] to visit patient, but CSW was informed that patient had discharged home yesterday, 06/22/17. CSW called & spoke with patient who reports that so far, she is doing well at home. Patient states that she has not heard from home health yet but was supposed to be setup with [place]. CSW made patient aware that CSW will make referral to [place] for transitions of care & CSW will follow-up in 3 weeks.    Raynaldo Opitz, LCSW [facility]  Clinical Social Worker cell #: [phone]

## 2017-06-24 ENCOUNTER — Other Ambulatory Visit: Payer: Self-pay | Admitting: *Deleted

## 2017-06-24 DIAGNOSIS — M21372 Foot drop, left foot: Secondary | ICD-10-CM | POA: Diagnosis not present

## 2017-06-24 DIAGNOSIS — Z6822 Body mass index (BMI) 22.0-22.9, adult: Secondary | ICD-10-CM | POA: Diagnosis not present

## 2017-06-24 DIAGNOSIS — E119 Type 2 diabetes mellitus without complications: Secondary | ICD-10-CM | POA: Diagnosis not present

## 2017-06-24 DIAGNOSIS — Z23 Encounter for immunization: Secondary | ICD-10-CM | POA: Diagnosis not present

## 2017-06-24 NOTE — Patient Outreach (Signed)
[facility] [facility] Care Management  06/24/2017  [name] 22-Aug-1941 [id number]   Transition of care   THN CM called and spoke [name] who Reports " I'm getting along well" THN CM reviewed CM referral for Transition of care calls   Medications Reports no rx given by snf - need digoxin grand daughter went out to get meds but not sure which meds Reports a follow up with [name] but did not get Digoxin Rx "I did not even think about it until today" THN CM able to updated EPIC with her other specialists- CV,GI, etc  Using w/c [facility] reviewed the medication list in EPIC [facility] CM confirmed no hh services but interested in an agency "off [address].  My friend works for them" Does not know name of company Never had home health before per [name] [name] states she is "alone" at time of call but [facility] CM heard "[name]" female speaking aggressively and loudly in the back ground when [name] got in her w/c to go look at the bag of medications left by her grand daughter  support system includes her daughter who arrives after 5 pm [name] asked if [facility] CM could return a call to her daughter  Plans Return call to [name] Daughter  [name]. Barbara Hamman, RN, BSN, [place] Care Management [phone]

## 2017-06-25 ENCOUNTER — Telehealth: Payer: Self-pay | Admitting: *Deleted

## 2017-06-25 NOTE — Patient Outreach (Signed)
[place] [facility]) Care Management  06/25/2017  [name] Jan 15, 1941 [id number]   Care coordination   [facility] CM called to speak with [name] grand daughter as [name] requested on 06/24/17   [facility] CM confirmed with [name] that [name] did not leave [facility] against medical advice (AMA).  [name] states [name] wanted to stay longer in snf but was "discharged after one hundred days were up" [name] states the family did receive a package of sheets from [place] but no one reviewed medications, diet, appointments, education on home care or home health The family was giving the papers in the [place] envelope and they had to return to get blister packs of medications to include dilantin 100 mg, Protonix, Imdur, Cardizem,   [name] began to read the blister packs and noted discrepancies in dilantin administration instructions Some of the packs stated to take 300 mg, some to take 200 mg and some to take 100 mg.    THN CM arrived to [name] home for an acute home visit to review these dilantin discrepancies and to reconcile the medications to the best of the [facility] CM ability using the sheets from the [facility] [place] after visit summary and a 4 page list of care summary from [place] went over care summary sheet given to family from [place] at [place].  Reviewed diet as no added salt.  THN CM discussed and answered questions about this diet.   Diabetes Reviewed the need to check cbg daily as listed on care sheet. [name] reports Barbara Merk has not been checking cbg since home but now that they know they will started checking daily  Social/support system/home environment- There are two chihuahua, a cat and a bulldog present during the home visit  [name] grand daughter were cooking and cleaning the home.  [name] was in her room when [facility] CM arrived and [facility] CM had to call out to her to ask various  questions until she was assisted to her w/c to the living room  [name], Son  and father of grand daughters, lives next door and called but did not visit during [facility] CM's home visit [name] and the 2 grand daughters ([name]) are care givers - [name] was noted to be during most of the care during the [facility] CM home visit  DME/Activity/Home health [name] has a left soft gray foot drop splint intact that she was given during her pcp appt on 06/23/17 at 10 am when she was seen by Barbara Blase, NP/PA  [name] was also given a rx for diabetic shoes with bracing in which she took to [employer] but was informed she needs a "physical exam" to finish getting assistance for processing [name] confirms that she had not been seen by the pcp office in "a long time" [facility] CM discussed that for her insurance carrier to assist with cost she would have to meet the DME referral criteria with documentation of need [name] has a memo from [place] stating Left ankle foot equnos contractive s/p stroke AFO with a date of 06/03/17  Discussed and encouraged non weight bearing [name] reports [name] is needing Moderate to maximal assistance for transfers, mobility and ADLs [name] was given a memo from [name] that requests she go to [place] on 07/14/17 at 10 am to [address] [place] [phone] for an AFO States she had OT/PT per sheet at [facility] [name]  wants to start PT/OT She states she called a "teresa but has not had a call back" [name] reports a friend, [name], worked at the home health agency she is familiar with. THN CM called [name] at  [address], [place], [place] [address]  [phone] the attempt to find name of the choice of home health agency. Left a message requesting a return all to [facility] CM or [name] to include telephone numbers   [name] states they have not been contacted by any  home health agency and does not recall [facility] offered information about home health services    Today's identified medical issue Developed diarrhea today  [name]  reports she had only cheese and crackers and denies hx of GI issues, intolerance to dairy products She states "that should not have made my stool harder not have cause diarrhea" THN CM encouraged fluids and starting with soft non acidic foods with contact to pcp office in am  Transportation Discussed that [facility] CM will ask [name] [facility] SW to check on transportation needs. [name] reports no issues with getting to pcp appt on 06/23/17. Her son took her to the appointment but may need alternative transportation when son working and grand daughter are in school   Medications  found in the home in bottles  Includes trazodone 50 mg 1 tab qhs dated 05/30/14 [name] Isosorbide mononitrate ER 1 tab qd dated 12/26/16  Cardizem 24 hr er 120 mg 1 tab qd dated 01/12/17 [name] Lisinopril 20 1 tab qd dated 12/18/16 [name] Protonix Barbara 40 mg 1 tab [name] dated 12/19/16   Reconciled THN CM reconciled the hospital list of medications compared to the medicines [name] has at her home and advised her to take the below medicines until otherwise advised by pcp office after [facility] CM review this list with staff on 06/26/17:  ASA 81 mg qd-(Encouraged only 81 mg versus 325 mg She was discharged from [facility] on 81 mg) Lipitor 40 mg qhs  Coreg 12.5 mg bid Cardizem 120 qd Imdur 30 mg qhs Lisinopril 20 qd Protonix 40 qhs potassium cl 20 meq qd Dilantin 100 mg bid Ambien 10 mg prn qhs prn Tylenol 500 mg prn Zofran 8 mg   prn Trazodone 50 mg prn   [name] has assisted with placing [name] medications in a pill box for daily use except the prn medicines  Discrepancy medications includes ASA (dose), coreg (dose) metformin, dilantin, plavix  The medicines on [facility] snf list but not found at the home include cholecalciferol 1000 take 2000 qd,  iron 325 1 tid, melatonin prn, miralax prn, senna 1 tab bid,  Vitamin B 1000 mg qd, vitamin c 500 tid to be taken when iron is taken Barbara Tsan  informs [facility] CM she is not taking any of these   Plans [facility] CM to consult with pcp office for medication reconciliation, DME and home health services [facility] CM to follow up on home health  Send message or collaborate with [facility] about transportation  Route note to pcp and other listed care team members  [facility] CM to see Barbara Ringle for home visit in 1-2 weeks  [facility] Calendar given to Barbara Ibanez and [facility] explained use of resource, and Major medical sections that includes action plans for DM and HTN [facility] Consent forms completed  Tali Coster L. Lavina Hamman, RN, BSN, [place] Care Management [phone]

## 2017-06-26 ENCOUNTER — Encounter: Payer: Self-pay | Admitting: *Deleted

## 2017-06-26 ENCOUNTER — Telehealth: Payer: Self-pay | Admitting: *Deleted

## 2017-06-26 DIAGNOSIS — K529 Noninfective gastroenteritis and colitis, unspecified: Secondary | ICD-10-CM | POA: Diagnosis not present

## 2017-06-26 DIAGNOSIS — K7460 Unspecified cirrhosis of liver: Secondary | ICD-10-CM | POA: Diagnosis not present

## 2017-06-26 NOTE — Patient Outreach (Signed)
[place] [facility]) Care Management  06/26/2017  Isadora Delorey [birth year] [id number]   Care coordination   [facility] CM received a call back from Mrs Inez Catalina, Mrs Geraci friend.  Mrs Inez Catalina informed [facility] CM that the home health agency she previously worked for was through [employer] and that office is closed now Reports "advanced home care brought the agency out"  Recommends Mrs Prestigiacomo use advanced home care Thanked her for her return call and recommendation  THN CM called pcp office to get assistance with home health orders, discuss concerns with [place] [facility] needs related to DM shoes and medication discrepancies  Spoke with Lattie Haw to get assistance  Lattie Haw reports Mrs Evelyn Saw benjamin mann on 06/24/17 Reports there were notes in the pcp office that [employer] could use. THN CM was transferred to speak with Marisue Brooklyn, NP/PA to assist with PT/OT  Mrs Kaluzny came to see Marisue Brooklyn this am about her diarrhea and not feeling well (?combination of medical issues dx, stress, stomach virus. Etc) Marisue Brooklyn assisted with ordering Home health OT/PT referral  [place] called [employer], spoke with Denton Ar and Doris to get assistance with understanding what is needed from the pcp office for Mrs Romanski's DM shoes Doris states a MD note and additional clinical is needed.  Doris will fax what is needed to Toribio [place] today so Marisue Brooklyn can get Dr Hilma Favors to assist on 06/26/17 in Dr Fusco's absence  Medication reconciliation - THN CM reviewed the medications in question ( Dilantin, Metformin, Plavix, iron, prn medications) Marisue Brooklyn questioned when and why Mrs Leinbach was placed on Dilantin.  THN CM discussed Mrs Gunther stating she did not know she had the listed seizure disorder indicated in the discharge summary. THN CM discussed Mrs Muma also had received Dilantin 100 mg bid at [facility] and was sent home with blister packs of Dilantin 100 mg. Marisue Brooklyn recommends Mrs Mousel  to stop taking metformin, plavix  Mrs Damron last A1C was 5.7 on 03/02/17 Marisue Brooklyn recommends Mrs Eakins continue taking Lipitor, coreg at 12.5 mg, iron, trazodone prn, vitamin C prn, Senna prn . Marisue Brooklyn also recommends that Mrs Detter can use any other prn medications   THN CM reviewed EPIC notes and found that Mrs Girouard was admitted to [facility] on 02/28/17 with seizure activity plus a fall with hip injury.  She was evaluated by Dr Royal Hawthorn on 03/03/17 and started on Dilantin and was discharged on dilantin to [facility]. THN CM returned a call to pcp office to make Marisue Brooklyn aware and to have her refer to EPIC note dated 03/03/17. THN CM received a call back from pcp staff informing CM that Marisue Brooklyn   requests Mrs Hartel to continue Dilantin and to be seen by a neurologist  [facility] CM called and spoke with Mrs Sitton about contact with Renato Battles & Marisue Brooklyn, changes in medications (Stop metformin and plavix), side effects of metformin, use of prns, neurology referral, 03/03/17 dx of seizures at [facility] by Dr Leonie Man, home health and DM shoes. Mrs Gulley reports not remembering "any of that" in relation to being seen by neurologist Dr Leonie Man during her hospitalization.  Mrs Bayley voices understanding about changes in medications Teach back method used to confirm she understood what medications to discontinue. THN CM offered to call back to speak with grand daughter [name] but Mrs Houseman stated she could remember to tell [name] and was writing down  information THN CM was providing.    She states she has been able to get some soup "to stay down since this morning" Encouraged fluids for hydration.  When [facility] CM inquired about her cbg reading for today, she informed THN CM she lost her last glucometer and [facility]. THN CM updated EPIC to indicate [facility] as preferred [facility] and discussed [place] health care glucometer benefit. THN CM discussed that she would call this in the pcp for her caregivers to  pick up at [place]. Also discussed discounted brand of relion case there are difficulties at [place] related to the united health care benefit glucometer program. [facility] CM called pcp office and request Marisue Brooklyn or RN call in an one touch or accu chek Rx with lancets and strips to [facility]  [facility] CM again discussed the 24 hour time frame for contact from [facility]) to initiate services [facility] CM gave Mrs Boateng [facility] contact number as [phone] to check on progress of services  Mrs Folkert was reminded of the need to have caregivers call and go to [place] on 07/14/17 for her AFO  Neurology referral Raquel Sarna called at [phone] 06/26/17 form Dr Merlene Laughter office to confirm Mrs Haliburton had been seen in the office but it ws a long time ago so a referral with clinical information (last two pcp office notes needed) [facility] CM sent an e-mail to Tanna Savoy requesting assistance from pcp office to get referral information to [facility] and left [place] fax number as [phone].  THN CM attempted to call [place] office but office closed at 5 pm. [facility] CM offered to send a copy of Dr Leonie Man evaluation from [facility] also  Plans to see Mrs Germany in 2 weeks for home visit but follow up with assist with neurology referral, glucometer And home health and call as need to Mrs Kishi and other collaborating health care providers Route note to pcp and medical providers pertinent and listed in EPIC care team Continue to collaborate with [facility] SW- sent SW in basket message about transportation ? [facility] eligibility Letter sent to Mrs Holecek and pcp  [facility] CM Care Plan Problem One     Most Recent Value  Care Plan Problem One  Transition of care from [facility] to home (medications, home health/DME needs, referrals/appointments)  Role Documenting the Problem One  Care Management [place] for Problem One  Active  [facility] Long Term Goal   Patient will have all services/DME for transition of care to home from snf  [place] Term Goal  Start Date  06/24/17  Interventions for Problem One Long Term Goal  TOC assessment, pcp/SW/home health collaboration, Education on home care, meds, diet, home health, reconcile medications  THN CM Short Term Goal #1   Patient will have medications reconciled and home health ordered and DME needed for her care  [facility] CM Short Term Goal #1 Start Date  06/24/17  Interventions for Short Term Goal #1  pcp/SW/home health collaboration, Education on home care, meds, diet, home health, reconcile medications  THN CM Short Term Goal #2   Patient will receive a referral and appointment for neurology   [facility] CM Short Term Goal #2 Start Date  06/26/17  Interventions for Short Term Goal #2  Review EPIC chart for seizure onset/tx, contact neurology for referral, assist with referral clinicals, obtain neurology appointment,        Joelene Millin L. Lavina Hamman, RN, BSN, CCM  [place] Management [phone]

## 2017-06-29 ENCOUNTER — Telehealth: Payer: Self-pay | Admitting: *Deleted

## 2017-06-29 DIAGNOSIS — I350 Nonrheumatic aortic (valve) stenosis: Secondary | ICD-10-CM | POA: Diagnosis not present

## 2017-06-29 DIAGNOSIS — M5136 Other intervertebral disc degeneration, lumbar region: Secondary | ICD-10-CM | POA: Diagnosis not present

## 2017-06-29 DIAGNOSIS — Z9181 History of falling: Secondary | ICD-10-CM | POA: Diagnosis not present

## 2017-06-29 DIAGNOSIS — I10 Essential (primary) hypertension: Secondary | ICD-10-CM | POA: Diagnosis not present

## 2017-06-29 DIAGNOSIS — E1151 Type 2 diabetes mellitus with diabetic peripheral angiopathy without gangrene: Secondary | ICD-10-CM | POA: Diagnosis not present

## 2017-06-29 DIAGNOSIS — K7581 Nonalcoholic steatohepatitis (NASH): Secondary | ICD-10-CM | POA: Diagnosis not present

## 2017-06-29 DIAGNOSIS — Z7982 Long term (current) use of aspirin: Secondary | ICD-10-CM | POA: Diagnosis not present

## 2017-06-29 DIAGNOSIS — D649 Anemia, unspecified: Secondary | ICD-10-CM | POA: Diagnosis not present

## 2017-06-29 DIAGNOSIS — Z87440 Personal history of urinary (tract) infections: Secondary | ICD-10-CM | POA: Diagnosis not present

## 2017-06-29 DIAGNOSIS — Z8673 Personal history of transient ischemic attack (TIA), and cerebral infarction without residual deficits: Secondary | ICD-10-CM | POA: Diagnosis not present

## 2017-06-29 DIAGNOSIS — S72302D Unspecified fracture of shaft of left femur, subsequent encounter for closed fracture with routine healing: Secondary | ICD-10-CM | POA: Diagnosis not present

## 2017-06-29 DIAGNOSIS — I2510 Atherosclerotic heart disease of native coronary artery without angina pectoris: Secondary | ICD-10-CM | POA: Diagnosis not present

## 2017-06-29 DIAGNOSIS — G4733 Obstructive sleep apnea (adult) (pediatric): Secondary | ICD-10-CM | POA: Diagnosis not present

## 2017-06-29 NOTE — Patient Outreach (Signed)
[place] [facility]) Care Management  06/29/2017  Averie Hornbaker 1940-12-06 [id number]   Care coordination  [facility] CM faxed to Raquel Sarna at [facility] neurology, referral department notes from 03/03/17 Dr Leonie Man Neurology notes during Mrs Beveridge's hospitalization to assist with referral process [facility] CM requested to be started by pcp, Dr Gerarda Fraction office last week  Fax confirmation received for clinicals faxed at 1702 06/29/17   Plans continue to collaborate with pcp and neurology office follow up with Mrs Miu next week or prn this week if issues arise  Patsy Varma L. Lavina Hamman RN, BSN, CCM [facility] Care Management [phone] '

## 2017-06-30 IMAGING — DX DG CHEST 2V
2 series · 2 of 2 positions shown · non-contrast
Comparison: 11/08/2016.

CLINICAL DATA: Chest pain and shortness of breath.

EXAM:
CHEST  2 VIEW

[chest pa]
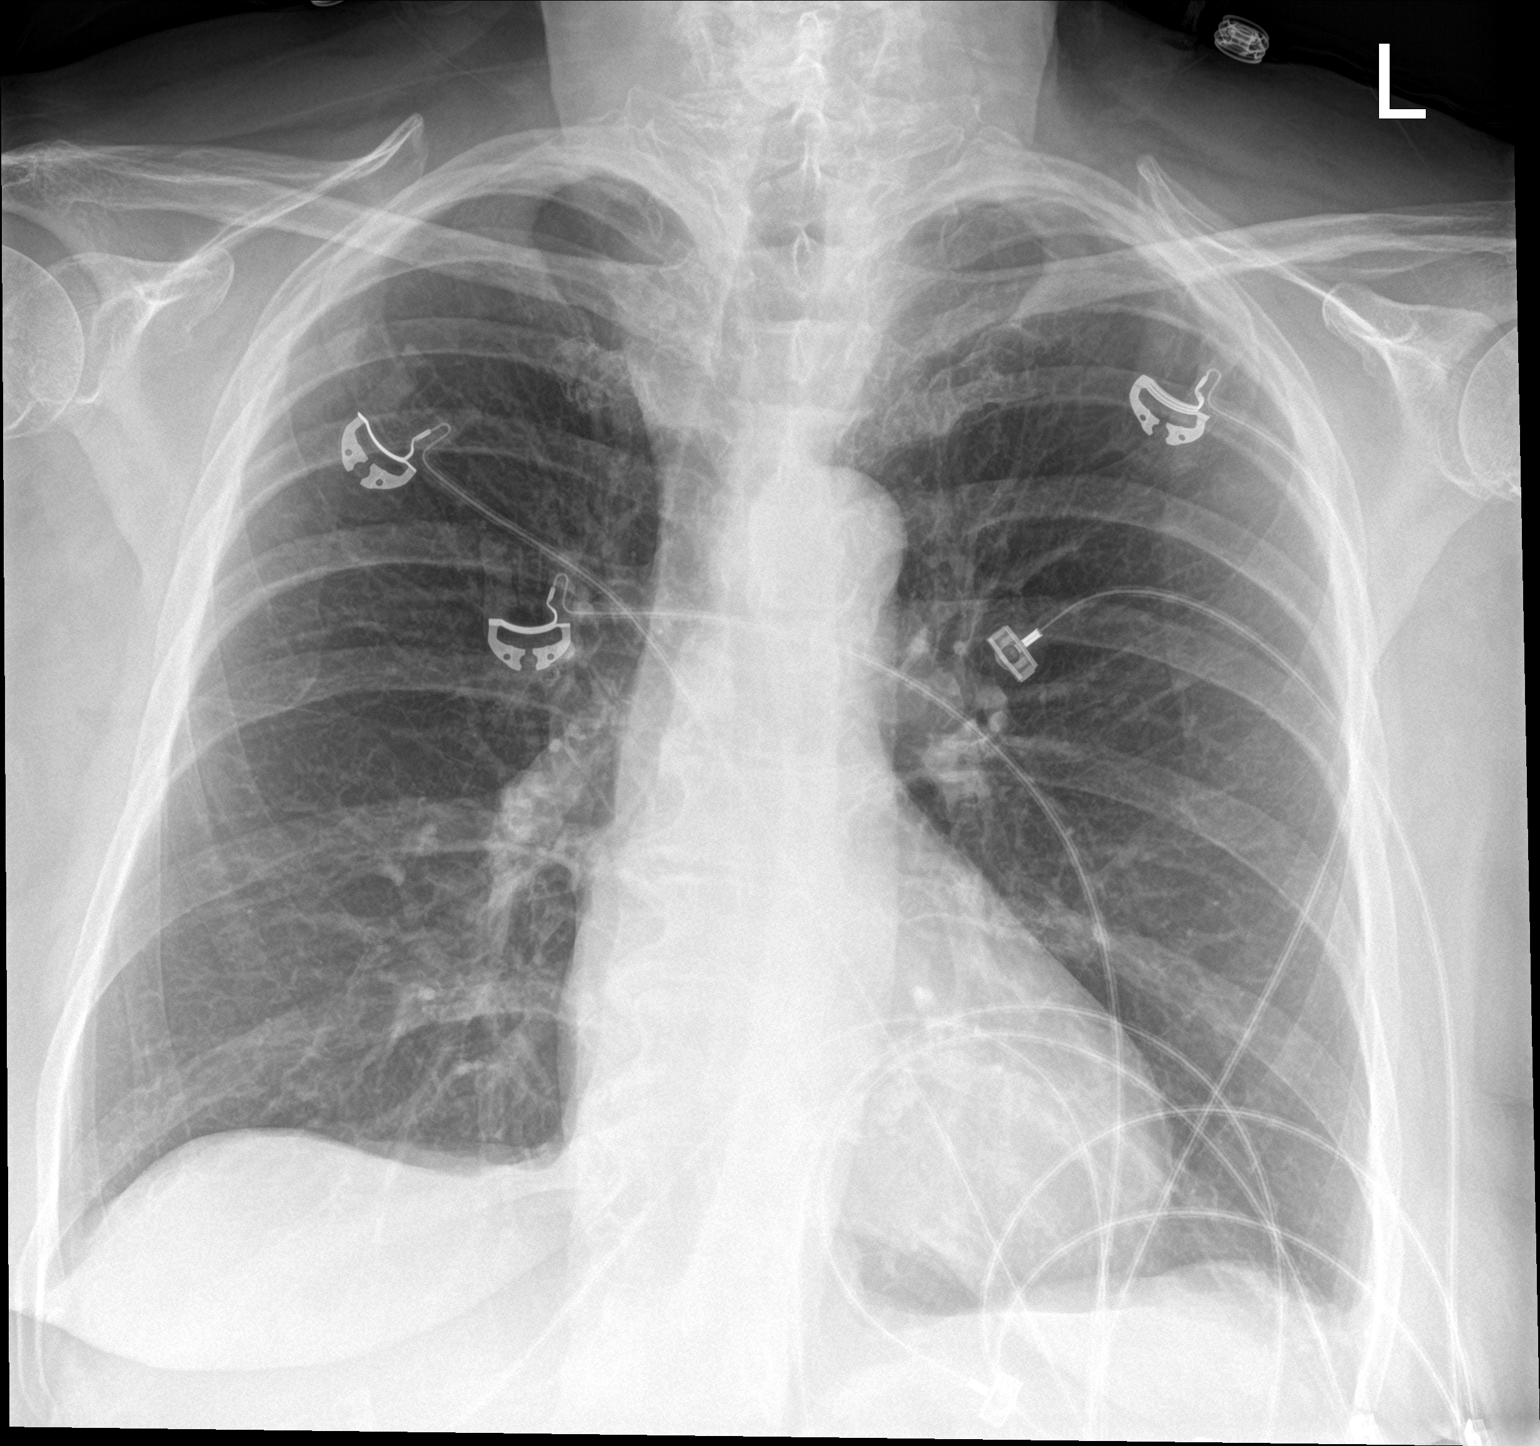

[chest lat]
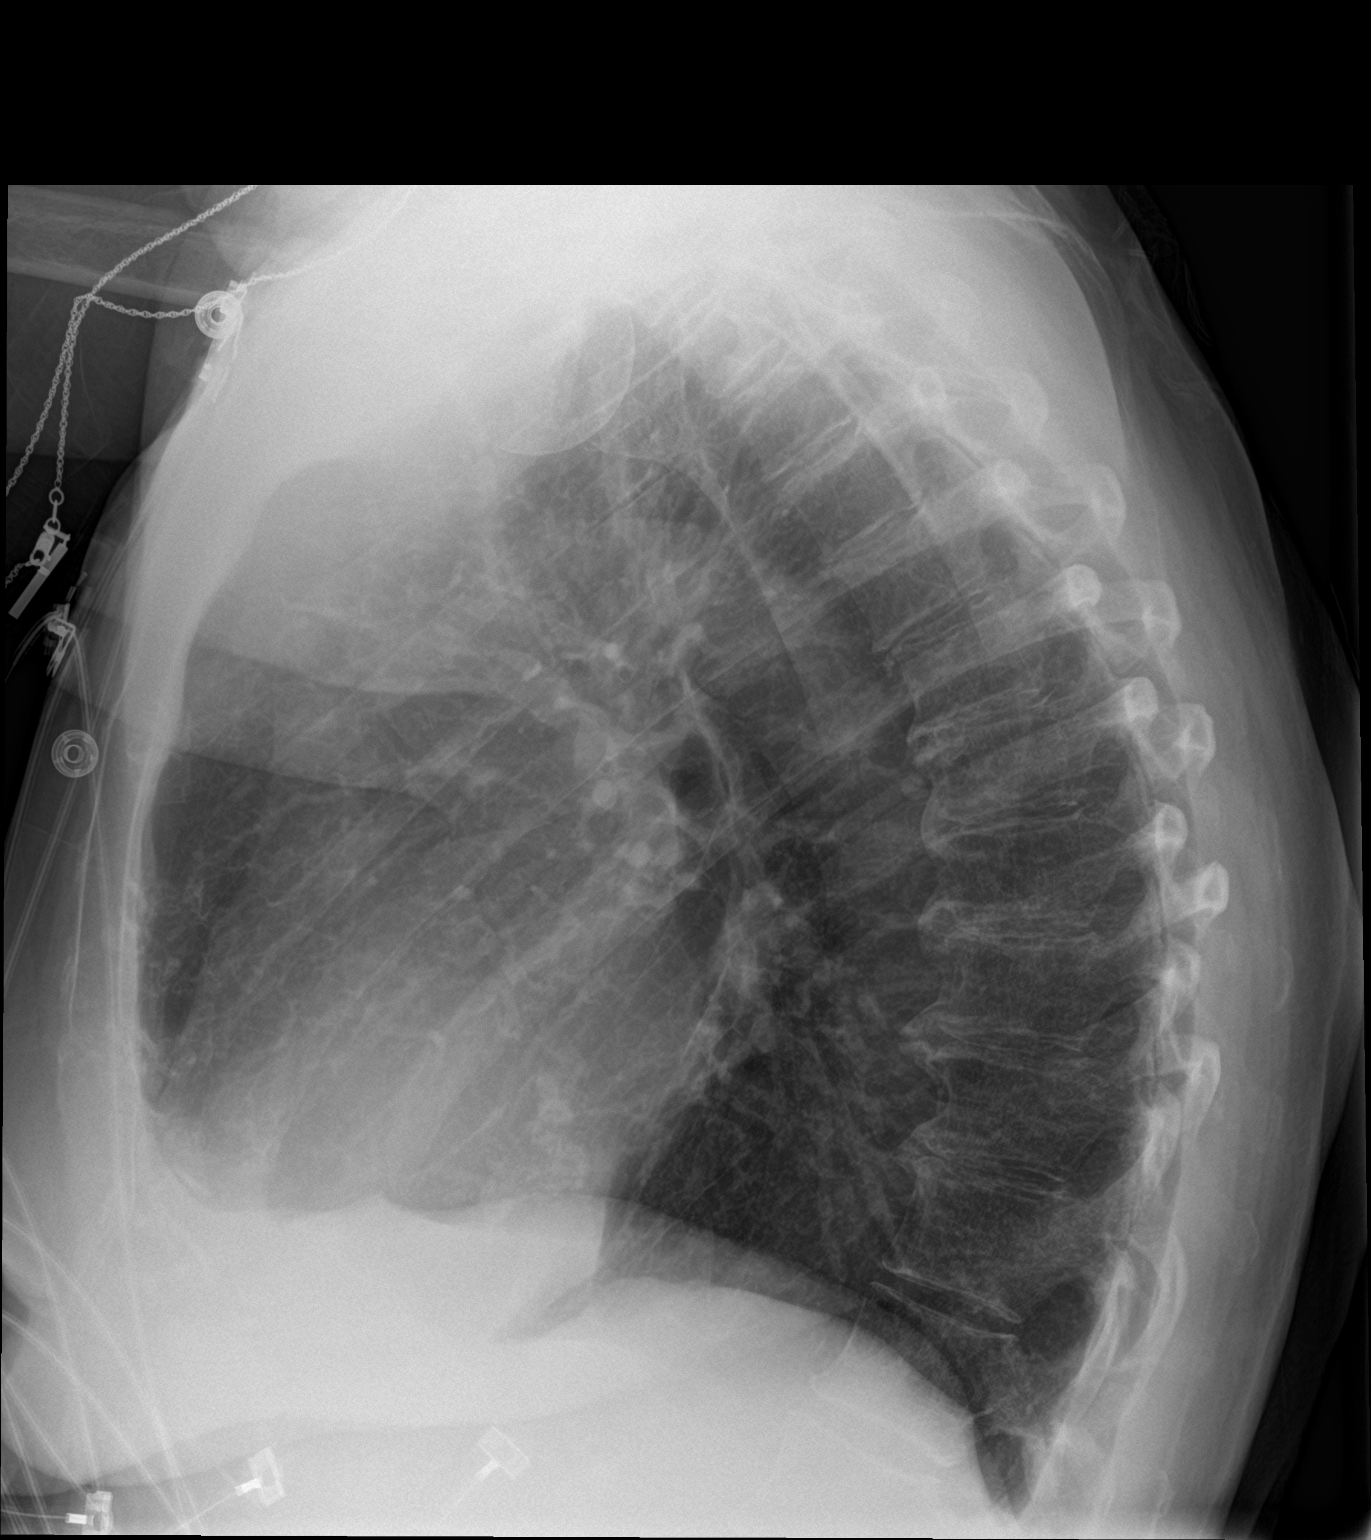

[2 of 2 positions shown; findings below may reference images not displayed]

FINDINGS: Normal sized heart. Clear lungs. Mildly tortuous and calcified
thoracic aorta. Prominent AP diameter of the chest and mild diffuse
peribronchial thickening. Thoracic spine degenerative changes.
IMPRESSION: 1. No acute abnormality.
2. Mild changes of COPD and chronic bronchitis.
3. Aortic atherosclerosis.

## 2017-06-30 NOTE — Patient Outreach (Signed)
Request received from Raynaldo Opitz, LCSW to mail patient personal care resources.  Information mailed today.

## 2017-07-01 DIAGNOSIS — G4733 Obstructive sleep apnea (adult) (pediatric): Secondary | ICD-10-CM | POA: Diagnosis not present

## 2017-07-01 DIAGNOSIS — Z87440 Personal history of urinary (tract) infections: Secondary | ICD-10-CM | POA: Diagnosis not present

## 2017-07-01 DIAGNOSIS — I350 Nonrheumatic aortic (valve) stenosis: Secondary | ICD-10-CM | POA: Diagnosis not present

## 2017-07-01 DIAGNOSIS — E1151 Type 2 diabetes mellitus with diabetic peripheral angiopathy without gangrene: Secondary | ICD-10-CM | POA: Diagnosis not present

## 2017-07-01 DIAGNOSIS — Z9181 History of falling: Secondary | ICD-10-CM | POA: Diagnosis not present

## 2017-07-01 DIAGNOSIS — S72302D Unspecified fracture of shaft of left femur, subsequent encounter for closed fracture with routine healing: Secondary | ICD-10-CM | POA: Diagnosis not present

## 2017-07-01 DIAGNOSIS — Z7982 Long term (current) use of aspirin: Secondary | ICD-10-CM | POA: Diagnosis not present

## 2017-07-01 DIAGNOSIS — M5136 Other intervertebral disc degeneration, lumbar region: Secondary | ICD-10-CM | POA: Diagnosis not present

## 2017-07-01 DIAGNOSIS — Z8673 Personal history of transient ischemic attack (TIA), and cerebral infarction without residual deficits: Secondary | ICD-10-CM | POA: Diagnosis not present

## 2017-07-01 DIAGNOSIS — I10 Essential (primary) hypertension: Secondary | ICD-10-CM | POA: Diagnosis not present

## 2017-07-01 DIAGNOSIS — D649 Anemia, unspecified: Secondary | ICD-10-CM | POA: Diagnosis not present

## 2017-07-01 DIAGNOSIS — I2510 Atherosclerotic heart disease of native coronary artery without angina pectoris: Secondary | ICD-10-CM | POA: Diagnosis not present

## 2017-07-01 DIAGNOSIS — K7581 Nonalcoholic steatohepatitis (NASH): Secondary | ICD-10-CM | POA: Diagnosis not present

## 2017-07-03 DIAGNOSIS — S72302D Unspecified fracture of shaft of left femur, subsequent encounter for closed fracture with routine healing: Secondary | ICD-10-CM | POA: Diagnosis not present

## 2017-07-03 DIAGNOSIS — K7581 Nonalcoholic steatohepatitis (NASH): Secondary | ICD-10-CM | POA: Diagnosis not present

## 2017-07-03 DIAGNOSIS — E1151 Type 2 diabetes mellitus with diabetic peripheral angiopathy without gangrene: Secondary | ICD-10-CM | POA: Diagnosis not present

## 2017-07-03 DIAGNOSIS — M5136 Other intervertebral disc degeneration, lumbar region: Secondary | ICD-10-CM | POA: Diagnosis not present

## 2017-07-03 DIAGNOSIS — D649 Anemia, unspecified: Secondary | ICD-10-CM | POA: Diagnosis not present

## 2017-07-03 DIAGNOSIS — I10 Essential (primary) hypertension: Secondary | ICD-10-CM | POA: Diagnosis not present

## 2017-07-03 DIAGNOSIS — I350 Nonrheumatic aortic (valve) stenosis: Secondary | ICD-10-CM | POA: Diagnosis not present

## 2017-07-03 DIAGNOSIS — Z8673 Personal history of transient ischemic attack (TIA), and cerebral infarction without residual deficits: Secondary | ICD-10-CM | POA: Diagnosis not present

## 2017-07-03 DIAGNOSIS — Z9181 History of falling: Secondary | ICD-10-CM | POA: Diagnosis not present

## 2017-07-03 DIAGNOSIS — Z7982 Long term (current) use of aspirin: Secondary | ICD-10-CM | POA: Diagnosis not present

## 2017-07-03 DIAGNOSIS — I2510 Atherosclerotic heart disease of native coronary artery without angina pectoris: Secondary | ICD-10-CM | POA: Diagnosis not present

## 2017-07-03 DIAGNOSIS — G4733 Obstructive sleep apnea (adult) (pediatric): Secondary | ICD-10-CM | POA: Diagnosis not present

## 2017-07-03 DIAGNOSIS — Z87440 Personal history of urinary (tract) infections: Secondary | ICD-10-CM | POA: Diagnosis not present

## 2017-07-06 ENCOUNTER — Other Ambulatory Visit: Payer: Self-pay | Admitting: *Deleted

## 2017-07-06 ENCOUNTER — Encounter: Payer: Self-pay | Admitting: *Deleted

## 2017-07-06 DIAGNOSIS — S72302D Unspecified fracture of shaft of left femur, subsequent encounter for closed fracture with routine healing: Secondary | ICD-10-CM | POA: Diagnosis not present

## 2017-07-06 DIAGNOSIS — Z87440 Personal history of urinary (tract) infections: Secondary | ICD-10-CM | POA: Diagnosis not present

## 2017-07-06 DIAGNOSIS — I350 Nonrheumatic aortic (valve) stenosis: Secondary | ICD-10-CM | POA: Diagnosis not present

## 2017-07-06 DIAGNOSIS — E1151 Type 2 diabetes mellitus with diabetic peripheral angiopathy without gangrene: Secondary | ICD-10-CM | POA: Diagnosis not present

## 2017-07-06 DIAGNOSIS — I2510 Atherosclerotic heart disease of native coronary artery without angina pectoris: Secondary | ICD-10-CM | POA: Diagnosis not present

## 2017-07-06 DIAGNOSIS — I10 Essential (primary) hypertension: Secondary | ICD-10-CM | POA: Diagnosis not present

## 2017-07-06 DIAGNOSIS — G4733 Obstructive sleep apnea (adult) (pediatric): Secondary | ICD-10-CM | POA: Diagnosis not present

## 2017-07-06 DIAGNOSIS — Z8673 Personal history of transient ischemic attack (TIA), and cerebral infarction without residual deficits: Secondary | ICD-10-CM | POA: Diagnosis not present

## 2017-07-06 DIAGNOSIS — K7581 Nonalcoholic steatohepatitis (NASH): Secondary | ICD-10-CM | POA: Diagnosis not present

## 2017-07-06 DIAGNOSIS — Z7982 Long term (current) use of aspirin: Secondary | ICD-10-CM | POA: Diagnosis not present

## 2017-07-06 DIAGNOSIS — M5136 Other intervertebral disc degeneration, lumbar region: Secondary | ICD-10-CM | POA: Diagnosis not present

## 2017-07-06 DIAGNOSIS — Z9181 History of falling: Secondary | ICD-10-CM | POA: Diagnosis not present

## 2017-07-06 DIAGNOSIS — D649 Anemia, unspecified: Secondary | ICD-10-CM | POA: Diagnosis not present

## 2017-07-06 NOTE — Patient Outreach (Signed)
[facility]) Care Management   07/06/2017  [name] 10-08-1940 [id number]  [name] is an 76 y.o. female with medical history significant of anemia, aortic insufficiency, back pain carotid artery stenosis, liver cirrhosis due to NASH, esophageal varices, CAD, lumbar DDD, essential hypertension, GERD/hiatal hernia, history of CVA, hyperlipidemia, obstructive sleep apnea, PSVT, recurring UTIs, type 2 diabetes, and falls in June and August 2018 resulting in a  Closed left femur fracture  Subjective:  "I think I am doing okay" " The therapist told me today it may take up to a year for me to get over this.  That was a little disappointing"  Objective:   BP 138/60   Pulse 78   Temp 98.3 F (36.8 C) (Oral)   Resp 20   Ht 1.6 m (_0 )   Wt 128 lb (58.1 kg)   SpO2 96%   BMI 22.67 kg/m  Review of Systems  Constitutional: Negative.  Negative for chills, diaphoresis, fever, malaise/fatigue and weight loss.  HENT: Positive for ear discharge. Negative for congestion, ear pain, hearing loss, nosebleeds, sinus pain, sore throat and tinnitus.        Left ear "trying to stop up"  Eyes: Negative.  Negative for blurred vision, double vision, photophobia, pain, discharge and redness.  Respiratory: Negative.  Negative for hemoptysis, sputum production, shortness of breath, wheezing and stridor.   Cardiovascular: Positive for leg swelling. Negative for chest pain, palpitations, orthopnea, claudication and PND.       Wearing compression  Gastrointestinal: Negative.  Negative for abdominal pain, blood in stool, constipation, diarrhea, heartburn, melena, nausea and vomiting.       Last BM this morning Has bmevery 2-3 days   Genitourinary: Negative.  Negative for dysuria, flank pain, frequency, hematuria and urgency.  Musculoskeletal: Positive for joint pain. Negative for back pain, falls, myalgias and neck pain.       Left lega nd leg pain control with motrin   Skin: Negative.   Negative for itching and rash.  Neurological: Negative.  Negative for dizziness, tingling, tremors, sensory change, speech change, focal weakness, seizures, loss of consciousness, weakness and headaches.  Endo/Heme/Allergies: Negative for environmental allergies and polydipsia. Bruises/bleeds easily.  Psychiatric/Behavioral: Negative.  Negative for depression, hallucinations, memory loss, substance abuse and suicidal ideas. The patient is not nervous/anxious and does not have insomnia.     Physical Exam  Constitutional: She is oriented to person, place, and time. She appears well-developed and well-nourished.  HENT:  Head: Normocephalic and atraumatic.  Eyes: Pupils are equal, round, and reactive to light. Conjunctivae are normal.  Neck: Normal range of motion. Neck supple.  Cardiovascular: Normal rate and regular rhythm.   Respiratory: Effort normal and breath sounds normal.  GI: Soft. Bowel sounds are normal.  Musculoskeletal: She exhibits edema.  Neurological: She is alert and oriented to person, place, and time.  Skin: Skin is warm and dry.  Psychiatric: She has a normal mood and affect. Her behavior is normal. Judgment and thought content normal.    Encounter Medications:   Outpatient Encounter Prescriptions as of 07/06/2017  Medication Sig Note  . acetaminophen (TYLENOL) 325 MG tablet Take 2 tablets (650 mg total) by mouth every 6 (six) hours as needed for mild pain (or Fever >/= 101).   . Amino Acids-Protein Hydrolys (FEEDING SUPPLEMENT, PRO-STAT SUGAR FREE 64,) LIQD Take 30 mLs by mouth 2 (two) times daily.   Marland Kitchen aspirin EC 81 MG tablet Take 81 mg by mouth daily.   Marland Kitchen  atorvastatin (LIPITOR) 40 MG tablet Take 40 mg by mouth daily.   . carvedilol (COREG) 12.5 MG tablet Take 12.5 mg by mouth 2 (two) times daily with a meal.    . diltiazem (CARDIZEM CD) 120 MG 24 hr capsule Take 120 mg by mouth daily.   . ferrous sulfate 325 (65 FE) MG tablet Take 1 tablet (325 mg total) by mouth 3  (three) times daily after meals.   . folic acid (FOLVITE) 1 MG tablet Take 1 tablet (1 mg total) by mouth daily.   Marland Kitchen ibuprofen (ADVIL) 200 MG tablet Take 200 mg by mouth every 6 (six) hours as needed for headache or moderate pain.    . isosorbide mononitrate (IMDUR) 30 MG 24 hr tablet Take 30 mg by mouth daily.    Marland Kitchen lisinopril (PRINIVIL,ZESTRIL) 20 MG tablet Take 20 mg by mouth daily.   . ondansetron (ZOFRAN) 4 MG tablet Take 4 mg by mouth every 8 (eight) hours as needed for nausea or vomiting.   . pantoprazole (PROTONIX) 40 MG tablet Take 40 mg by mouth daily.   . phenytoin (DILANTIN) 100 MG ER capsule Take 100 mg by mouth 2 (two) times daily. 06/26/2017: Taking and recommended to take dilantin 100 mg bid by pcp office   . polyethylene glycol (MIRALAX / GLYCOLAX) packet Take 17 g by mouth daily.   . potassium chloride SA (K-DUR,KLOR-CON) 20 MEQ tablet Take 1 tablet (20 mEq total) by mouth daily. 06/26/2017: Taking 20 mEQ   . vitamin C (ASCORBIC ACID) 500 MG tablet Take 500 mg by mouth 3 (three) times daily.   Marland Kitchen zolpidem (AMBIEN) 10 MG tablet take 1 tablet by mouth at bedtime if needed    No facility-administered encounter medications on file as of 07/06/2017.     Functional Status:   In your present state of health, do you have any difficulty performing the following activities: 07/06/2017 06/26/2017  Hearing? N N  Vision? N N  Difficulty concentrating or making decisions? N N  Walking or climbing stairs? Y Y  Dressing or bathing? Y Y  Doing errands, shopping? [name]  Preparing Food and eating ? N Y  Using the Toilet? N Y  In the past six months, have you accidently leaked urine? N Y  Do you have problems with loss of bowel control? N Y  Managing your Medications? N Y  Managing your Finances? N Y  Housekeeping or managing your Housekeeping? N Y  Some recent data might be hidden    Fall/Depression Screening:    Fall Risk  07/06/2017 03/09/2017  Falls in the past year? Yes Yes  Number  falls in past yr: 2 or more 1  Injury with Fall? Yes Yes  Risk Factor Category  High Fall Risk -  Risk for fall due to : History of fall(s) Impaired balance/gait;Impaired mobility  Risk for fall due to: Comment history of one fall in 2018 -  Follow up - Falls prevention discussed   PHQ 2/9 Scores 07/06/2017 03/09/2017  PHQ - 2 Score 0 0    Assessment:    [facility] CM met with [name] at her home today for continued transition of care and second [facility] CM home visit . She is pushing herself around in her wheelchair. CM noted the foot rests are not intact   Leg swelling of both legs. Legs are tight, warm to touch but she denies pain. Encouraged use of foot rests for elevation with compression stockings Reports she has been  wearing compressions most days "except today.  They are in the wash" [facility] CM assisted her with finding her foot rests in her closet and elevating her left leg.   Left advance directives package and new [facility] Calendar for 2019 for review  Nausea/Diarrhea resolved from last home visit -Used Zofran x 3 but  Now feels better   Former smoker stopped 6 months ago per pt but noted ash tray with cigarette buds on table in living room - son is visiting also today  [facility] health [facility] Reports only pulled up rug in hallway x 1, HHPT visits on Mondays, Wednesdays and Fridays Determined no need for [facility] at this time.   No noted residual weakness of upper extremities Foot drop of left foot  Diabetes not check lately.  Checking for glucometer "my sugars have been running normal in the one thirties" [name] able to find 2 glucometer- one is a true test and the other is an one touch ultra mini  CM wrote in her [facility] CM calendar the glucometers [employer] available on her [employer] benefit. [facility] CM discussed contacting her pcp office to get further orders for test strips, lancets prn  Last A1C  Patient does not feel she has diabetes  Neurology referral-  [facility] CM called and left a  message for Raquel Sarna B at Dr Merlene Laughter office to attempt to get an updated on the neurology referral.  [name] reports no calls at this time from Dr Merlene Laughter office and preference is to see a local neurologist versus having to go to [facility]   Weight 128 lbs when left [place] Highest weight has been close to 200 lbs   CAD- MI in 1980s 55-65% EF discussed from last hospital discharge   Former smoker stopped 6 months ago per patient   Appointments  No future follow up appointments mae at this time.Barbara Nickolas informed CM she would "handle" making follow up appointments for her pcp, CV prn Denied need for CM assistance   Support system -Son and his female friend visited during the home visit    Plan:  CM and [name] called [employer] OTC benefit First line medical, spoke with Horris Latino to confirm she has an Essentially quarterly plan  Has 84 Credits or $50/quarter - Requested a catalog to be sent to home address  [facility] CM showed her a sample copy of a catalog- Wrote down all register information in 2018 [facility] calendar for her to share with [name] grand daughter  Still needs her AFO from [facility] and neurology referral   Route note to pcp and other listed care team members  Follow up with [name] in 2-3 weeks   [facility] CM Care Plan Problem One     Most Recent Value  Care Plan Problem One  (P) Transition of care from [place] to home (medications, home health/DME needs, referrals/appointments)  Role Documenting the Problem One  (P) Care Management [place] for Problem One  (P) Active  [facility] Long Term Goal   (P) Patient will have all services/DME for transition of care to home from snf  [facility] Long Term Goal Start Date  (P) 06/24/17  Interventions for Problem One Long Term Goal  (P) TOC assessment, pcp/SW/home health collaboration, Education on home care, meds, diet, home health, reconcile medications  [facility] CM Short Term Goal #1   (P) Patient will have medications reconciled and home health ordered and  DME needed for her care  [facility] CM Short Term Goal #1 Start Date  (P) 06/24/17  Interventions for Short Term Goal #1  (P) pcp/SW/home health collaboration, Education on home care, meds, diet, home health, reconcile medications  [facility] CM Short Term Goal #2   (P) Patient will receive a referral and appointment for neurology   [facility] CM Short Term Goal #2 Start Date  (P) 06/26/17  Interventions for Short Term Goal #2  (P) Review EPIC chart for seizure onset/tx, contact neurology for referral, assist with referral clinicals, obtain neurology appointment,        Joelene Millin L. Lavina Hamman, RN, BSN, [place] Care Management [phone]

## 2017-07-08 DIAGNOSIS — I350 Nonrheumatic aortic (valve) stenosis: Secondary | ICD-10-CM | POA: Diagnosis not present

## 2017-07-08 DIAGNOSIS — I2510 Atherosclerotic heart disease of native coronary artery without angina pectoris: Secondary | ICD-10-CM | POA: Diagnosis not present

## 2017-07-08 DIAGNOSIS — Z9181 History of falling: Secondary | ICD-10-CM | POA: Diagnosis not present

## 2017-07-08 DIAGNOSIS — D649 Anemia, unspecified: Secondary | ICD-10-CM | POA: Diagnosis not present

## 2017-07-08 DIAGNOSIS — G4733 Obstructive sleep apnea (adult) (pediatric): Secondary | ICD-10-CM | POA: Diagnosis not present

## 2017-07-08 DIAGNOSIS — M5136 Other intervertebral disc degeneration, lumbar region: Secondary | ICD-10-CM | POA: Diagnosis not present

## 2017-07-08 DIAGNOSIS — Z8673 Personal history of transient ischemic attack (TIA), and cerebral infarction without residual deficits: Secondary | ICD-10-CM | POA: Diagnosis not present

## 2017-07-08 DIAGNOSIS — Z87440 Personal history of urinary (tract) infections: Secondary | ICD-10-CM | POA: Diagnosis not present

## 2017-07-08 DIAGNOSIS — I10 Essential (primary) hypertension: Secondary | ICD-10-CM | POA: Diagnosis not present

## 2017-07-08 DIAGNOSIS — K7581 Nonalcoholic steatohepatitis (NASH): Secondary | ICD-10-CM | POA: Diagnosis not present

## 2017-07-08 DIAGNOSIS — Z7982 Long term (current) use of aspirin: Secondary | ICD-10-CM | POA: Diagnosis not present

## 2017-07-08 DIAGNOSIS — S72302D Unspecified fracture of shaft of left femur, subsequent encounter for closed fracture with routine healing: Secondary | ICD-10-CM | POA: Diagnosis not present

## 2017-07-08 DIAGNOSIS — E1151 Type 2 diabetes mellitus with diabetic peripheral angiopathy without gangrene: Secondary | ICD-10-CM | POA: Diagnosis not present

## 2017-07-10 DIAGNOSIS — I2510 Atherosclerotic heart disease of native coronary artery without angina pectoris: Secondary | ICD-10-CM | POA: Diagnosis not present

## 2017-07-10 DIAGNOSIS — Z7982 Long term (current) use of aspirin: Secondary | ICD-10-CM | POA: Diagnosis not present

## 2017-07-10 DIAGNOSIS — I350 Nonrheumatic aortic (valve) stenosis: Secondary | ICD-10-CM | POA: Diagnosis not present

## 2017-07-10 DIAGNOSIS — Z87440 Personal history of urinary (tract) infections: Secondary | ICD-10-CM | POA: Diagnosis not present

## 2017-07-10 DIAGNOSIS — S72302D Unspecified fracture of shaft of left femur, subsequent encounter for closed fracture with routine healing: Secondary | ICD-10-CM | POA: Diagnosis not present

## 2017-07-10 DIAGNOSIS — E1151 Type 2 diabetes mellitus with diabetic peripheral angiopathy without gangrene: Secondary | ICD-10-CM | POA: Diagnosis not present

## 2017-07-10 DIAGNOSIS — G4733 Obstructive sleep apnea (adult) (pediatric): Secondary | ICD-10-CM | POA: Diagnosis not present

## 2017-07-10 DIAGNOSIS — Z8673 Personal history of transient ischemic attack (TIA), and cerebral infarction without residual deficits: Secondary | ICD-10-CM | POA: Diagnosis not present

## 2017-07-10 DIAGNOSIS — M5136 Other intervertebral disc degeneration, lumbar region: Secondary | ICD-10-CM | POA: Diagnosis not present

## 2017-07-10 DIAGNOSIS — K7581 Nonalcoholic steatohepatitis (NASH): Secondary | ICD-10-CM | POA: Diagnosis not present

## 2017-07-10 DIAGNOSIS — D649 Anemia, unspecified: Secondary | ICD-10-CM | POA: Diagnosis not present

## 2017-07-10 DIAGNOSIS — I10 Essential (primary) hypertension: Secondary | ICD-10-CM | POA: Diagnosis not present

## 2017-07-10 DIAGNOSIS — Z9181 History of falling: Secondary | ICD-10-CM | POA: Diagnosis not present

## 2017-07-13 ENCOUNTER — Other Ambulatory Visit: Payer: Self-pay | Admitting: *Deleted

## 2017-07-13 DIAGNOSIS — I10 Essential (primary) hypertension: Secondary | ICD-10-CM | POA: Diagnosis not present

## 2017-07-13 DIAGNOSIS — D649 Anemia, unspecified: Secondary | ICD-10-CM | POA: Diagnosis not present

## 2017-07-13 DIAGNOSIS — G4733 Obstructive sleep apnea (adult) (pediatric): Secondary | ICD-10-CM | POA: Diagnosis not present

## 2017-07-13 DIAGNOSIS — Z7982 Long term (current) use of aspirin: Secondary | ICD-10-CM | POA: Diagnosis not present

## 2017-07-13 DIAGNOSIS — Z8673 Personal history of transient ischemic attack (TIA), and cerebral infarction without residual deficits: Secondary | ICD-10-CM | POA: Diagnosis not present

## 2017-07-13 DIAGNOSIS — E1151 Type 2 diabetes mellitus with diabetic peripheral angiopathy without gangrene: Secondary | ICD-10-CM | POA: Diagnosis not present

## 2017-07-13 DIAGNOSIS — I2510 Atherosclerotic heart disease of native coronary artery without angina pectoris: Secondary | ICD-10-CM | POA: Diagnosis not present

## 2017-07-13 DIAGNOSIS — M5136 Other intervertebral disc degeneration, lumbar region: Secondary | ICD-10-CM | POA: Diagnosis not present

## 2017-07-13 DIAGNOSIS — I350 Nonrheumatic aortic (valve) stenosis: Secondary | ICD-10-CM | POA: Diagnosis not present

## 2017-07-13 DIAGNOSIS — S72302D Unspecified fracture of shaft of left femur, subsequent encounter for closed fracture with routine healing: Secondary | ICD-10-CM | POA: Diagnosis not present

## 2017-07-13 DIAGNOSIS — K7581 Nonalcoholic steatohepatitis (NASH): Secondary | ICD-10-CM | POA: Diagnosis not present

## 2017-07-13 DIAGNOSIS — Z9181 History of falling: Secondary | ICD-10-CM | POA: Diagnosis not present

## 2017-07-13 DIAGNOSIS — Z87440 Personal history of urinary (tract) infections: Secondary | ICD-10-CM | POA: Diagnosis not present

## 2017-07-14 NOTE — Patient Outreach (Signed)
[facility]) Care Management  07/14/2017  [name] 1940-10-06 [id number]   CSW called & spoke with patient to follow-up on community resources for transportation. Patient confirmed that her son has the information but has been able to take her to her doctors appointments so far, but was appreciative of the information should he not be able to in the future.   CSW will perform a case closure on patient, as all goals of treatment have been met from social work standpoint and no additional social work needs have been identified at this time. CSW will notify patient's RNCM with [facility], Kim of CSW's plans to close patient's case.   CSW will fax an update to patient's Primary Care Physician, Dr. Redmond School to ensure that they are aware of CSW's involvement with patient's plan of care.   CSW will ensure that patient is aware of RNCM with [facility] continued involvement with patient's care.    Raynaldo Opitz, LCSW [facility]  Clinical Social Worker cell #: [phone]

## 2017-07-17 DIAGNOSIS — Z87440 Personal history of urinary (tract) infections: Secondary | ICD-10-CM | POA: Diagnosis not present

## 2017-07-17 DIAGNOSIS — Z8673 Personal history of transient ischemic attack (TIA), and cerebral infarction without residual deficits: Secondary | ICD-10-CM | POA: Diagnosis not present

## 2017-07-17 DIAGNOSIS — I10 Essential (primary) hypertension: Secondary | ICD-10-CM | POA: Diagnosis not present

## 2017-07-17 DIAGNOSIS — I2510 Atherosclerotic heart disease of native coronary artery without angina pectoris: Secondary | ICD-10-CM | POA: Diagnosis not present

## 2017-07-17 DIAGNOSIS — I350 Nonrheumatic aortic (valve) stenosis: Secondary | ICD-10-CM | POA: Diagnosis not present

## 2017-07-17 DIAGNOSIS — Z7982 Long term (current) use of aspirin: Secondary | ICD-10-CM | POA: Diagnosis not present

## 2017-07-17 DIAGNOSIS — Z9181 History of falling: Secondary | ICD-10-CM | POA: Diagnosis not present

## 2017-07-17 DIAGNOSIS — K7581 Nonalcoholic steatohepatitis (NASH): Secondary | ICD-10-CM | POA: Diagnosis not present

## 2017-07-17 DIAGNOSIS — M5136 Other intervertebral disc degeneration, lumbar region: Secondary | ICD-10-CM | POA: Diagnosis not present

## 2017-07-17 DIAGNOSIS — G4733 Obstructive sleep apnea (adult) (pediatric): Secondary | ICD-10-CM | POA: Diagnosis not present

## 2017-07-17 DIAGNOSIS — S72302D Unspecified fracture of shaft of left femur, subsequent encounter for closed fracture with routine healing: Secondary | ICD-10-CM | POA: Diagnosis not present

## 2017-07-17 DIAGNOSIS — D649 Anemia, unspecified: Secondary | ICD-10-CM | POA: Diagnosis not present

## 2017-07-17 DIAGNOSIS — E1151 Type 2 diabetes mellitus with diabetic peripheral angiopathy without gangrene: Secondary | ICD-10-CM | POA: Diagnosis not present

## 2017-07-20 DIAGNOSIS — E1151 Type 2 diabetes mellitus with diabetic peripheral angiopathy without gangrene: Secondary | ICD-10-CM | POA: Diagnosis not present

## 2017-07-20 DIAGNOSIS — M5136 Other intervertebral disc degeneration, lumbar region: Secondary | ICD-10-CM | POA: Diagnosis not present

## 2017-07-20 DIAGNOSIS — I350 Nonrheumatic aortic (valve) stenosis: Secondary | ICD-10-CM | POA: Diagnosis not present

## 2017-07-20 DIAGNOSIS — S72302D Unspecified fracture of shaft of left femur, subsequent encounter for closed fracture with routine healing: Secondary | ICD-10-CM | POA: Diagnosis not present

## 2017-07-20 DIAGNOSIS — D649 Anemia, unspecified: Secondary | ICD-10-CM | POA: Diagnosis not present

## 2017-07-20 DIAGNOSIS — I2510 Atherosclerotic heart disease of native coronary artery without angina pectoris: Secondary | ICD-10-CM | POA: Diagnosis not present

## 2017-07-20 DIAGNOSIS — Z8673 Personal history of transient ischemic attack (TIA), and cerebral infarction without residual deficits: Secondary | ICD-10-CM | POA: Diagnosis not present

## 2017-07-20 DIAGNOSIS — I10 Essential (primary) hypertension: Secondary | ICD-10-CM | POA: Diagnosis not present

## 2017-07-20 DIAGNOSIS — Z9181 History of falling: Secondary | ICD-10-CM | POA: Diagnosis not present

## 2017-07-20 DIAGNOSIS — Z7982 Long term (current) use of aspirin: Secondary | ICD-10-CM | POA: Diagnosis not present

## 2017-07-20 DIAGNOSIS — K7581 Nonalcoholic steatohepatitis (NASH): Secondary | ICD-10-CM | POA: Diagnosis not present

## 2017-07-20 DIAGNOSIS — Z87440 Personal history of urinary (tract) infections: Secondary | ICD-10-CM | POA: Diagnosis not present

## 2017-07-20 DIAGNOSIS — G4733 Obstructive sleep apnea (adult) (pediatric): Secondary | ICD-10-CM | POA: Diagnosis not present

## 2017-07-21 ENCOUNTER — Other Ambulatory Visit: Payer: Self-pay | Admitting: *Deleted

## 2017-07-21 NOTE — Patient Outreach (Signed)
[facility]) Care Management   07/21/2017  Armeda Plumb [birth year] [id number]  Analy Bassford is an 76 y.o. female with medical history significant of anemia, aortic insufficiency, back pain carotid artery stenosis, liver cirrhosis due to NASH, esophageal varices, CAD, lumbar DDD, essential hypertension, GERD/hiatal hernia, history of CVA, hyperlipidemia, obstructive sleep apnea, PSVT, recurring UTIs, type 2 diabetes, and falls in June and August 2018 resulting in a  Closed left femur fracture  Subjective:  See notes below   Objective:   BP 120/80   Pulse 72   Temp (!) 97.4 F (36.3 C) (Oral)   Resp 18   SpO2 98%   Review of Systems  HENT: Negative for congestion, ear discharge, ear pain, hearing loss, nosebleeds, sinus pain, sore throat and tinnitus.        Sniffles and a bad taste in her mouth  Eyes: Negative.   Respiratory: Negative.  Negative for stridor.   Cardiovascular: Positive for leg swelling. Negative for chest pain, palpitations, orthopnea, claudication and PND.  Gastrointestinal: Positive for heartburn and nausea. Negative for abdominal pain, blood in stool, constipation, diarrhea, melena and vomiting.  Genitourinary: Negative.   Musculoskeletal: Positive for joint pain.  Skin: Negative.   Neurological: Positive for weakness. Negative for dizziness, tingling, tremors, sensory change, speech change, focal weakness, seizures, loss of consciousness and headaches.  Endo/Heme/Allergies: Negative for environmental allergies and polydipsia. Bruises/bleeds easily.  Psychiatric/Behavioral: Negative.     Physical Exam  Constitutional: She is oriented to person, place, and time. She appears well-developed and well-nourished.  HENT:  Head: Normocephalic and atraumatic.  Eyes: Conjunctivae are normal. Pupils are equal, round, and reactive to light.  Neck: Normal range of motion. Neck supple.  Cardiovascular: Normal rate and regular rhythm.  Respiratory: Effort  normal and breath sounds normal.  GI: Soft.  Musculoskeletal: She exhibits edema.  Neurological: She is alert and oriented to person, place, and time.  Skin: Skin is warm and dry.  Psychiatric: She has a normal mood and affect. Her behavior is normal. Judgment and thought content normal.    Encounter Medications:   Outpatient Encounter Medications as of 07/21/2017  Medication Sig Note  . acetaminophen (TYLENOL) 325 MG tablet Take 2 tablets (650 mg total) by mouth every 6 (six) hours as needed for mild pain (or Fever >/= 101).   . Amino Acids-Protein Hydrolys (FEEDING SUPPLEMENT, PRO-STAT SUGAR FREE 64,) LIQD Take 30 mLs by mouth 2 (two) times daily.   Marland Kitchen aspirin EC 81 MG tablet Take 81 mg by mouth daily.   Marland Kitchen atorvastatin (LIPITOR) 40 MG tablet Take 40 mg by mouth daily.   . carvedilol (COREG) 12.5 MG tablet Take 12.5 mg by mouth 2 (two) times daily with a meal.    . diltiazem (CARDIZEM CD) 120 MG 24 hr capsule Take 120 mg by mouth daily.   . ferrous sulfate 325 (65 FE) MG tablet Take 1 tablet (325 mg total) by mouth 3 (three) times daily after meals.   . folic acid (FOLVITE) 1 MG tablet Take 1 tablet (1 mg total) by mouth daily.   Marland Kitchen ibuprofen (ADVIL) 200 MG tablet Take 200 mg by mouth every 6 (six) hours as needed for headache or moderate pain.    . isosorbide mononitrate (IMDUR) 30 MG 24 hr tablet Take 30 mg by mouth daily.    Marland Kitchen lisinopril (PRINIVIL,ZESTRIL) 20 MG tablet Take 20 mg by mouth daily.   . ondansetron (ZOFRAN) 4 MG tablet Take 4 mg by mouth every  8 (eight) hours as needed for nausea or vomiting.   . pantoprazole (PROTONIX) 40 MG tablet Take 40 mg by mouth daily.   . phenytoin (DILANTIN) 100 MG ER capsule Take 100 mg by mouth 2 (two) times daily. 06/26/2017: Taking and recommended to take dilantin 100 mg bid by pcp office   . polyethylene glycol (MIRALAX / GLYCOLAX) packet Take 17 g by mouth daily.   . potassium chloride SA (K-DUR,KLOR-CON) 20 MEQ tablet Take 1 tablet (20 mEq  total) by mouth daily. 06/26/2017: Taking 20 mEQ   . vitamin C (ASCORBIC ACID) 500 MG tablet Take 500 mg by mouth 3 (three) times daily.   Marland Kitchen zolpidem (AMBIEN) 10 MG tablet take 1 tablet by mouth at bedtime if needed    No facility-administered encounter medications on file as of 07/21/2017.     Functional Status:   In your present state of health, do you have any difficulty performing the following activities: 07/06/2017 06/26/2017  Hearing? N N  Vision? N N  Difficulty concentrating or making decisions? N N  Walking or climbing stairs? Y Y  Dressing or bathing? Y Y  Doing errands, shopping? [name]  Preparing Food and eating ? N Y  Using the Toilet? N Y  In the past six months, have you accidently leaked urine? N Y  Do you have problems with loss of bowel control? N Y  Managing your Medications? N Y  Managing your Finances? N Y  Housekeeping or managing your Housekeeping? N Y  Some recent data might be hidden    Fall/Depression Screening:    Fall Risk  07/06/2017 03/09/2017  Falls in the past year? Yes Yes  Number falls in past yr: 2 or more 1  Injury with Fall? Yes Yes  Risk Factor Category  High Fall Risk -  Risk for fall due to : History of fall(s) Impaired balance/gait;Impaired mobility  Risk for fall due to: Comment history of one fall in 2018 -  Follow up - Falls prevention discussed   PHQ 2/9 Scores 07/06/2017 03/09/2017  PHQ - 2 Score 0 0    Assessment:   Met with Mrs Aldaz and her grand daughter [name] today  Cold sniffling noted   Nausea for 1 week taking medication Worst after PT no dizziness denies ear issues Only s/s is "bad taste in mouth"  Left AFO  Has not obtained AFO yet [facility] CM called and spoke with jasmine at [employer] to confirm they have Mrs Kuhrt AFO stirrup and need to receive a pair of shoes with good sole brought to the [employer] office   Swelling in left foot and leg -HH PT recommends she go to her pcp if the swelling does not resolve by Wednesday  07/22/17  Follow up appointments Appointment Dr Elesa Massed on 08/02/17  Wt today is questionable at 119 lbs- Patient questions if her scale is not calibrated Her weight on 07/06/17 was 128 lbs   Plan: Follow up with Mrs Barto in 2-4 weeks  Offer to get [employer] [facility] medical services set up to get assist with GERD medication Called [facility] to arrange a possible 07/23/17 Friday follow up appointment with Aldona Bar at 1130 (spoke with cecilya)  Joelene Millin L. Lavina Hamman, RN, BSN, [place] Care Management [phone]

## 2017-07-22 DIAGNOSIS — K7581 Nonalcoholic steatohepatitis (NASH): Secondary | ICD-10-CM | POA: Diagnosis not present

## 2017-07-22 DIAGNOSIS — S72302D Unspecified fracture of shaft of left femur, subsequent encounter for closed fracture with routine healing: Secondary | ICD-10-CM | POA: Diagnosis not present

## 2017-07-22 DIAGNOSIS — D649 Anemia, unspecified: Secondary | ICD-10-CM | POA: Diagnosis not present

## 2017-07-22 DIAGNOSIS — E1151 Type 2 diabetes mellitus with diabetic peripheral angiopathy without gangrene: Secondary | ICD-10-CM | POA: Diagnosis not present

## 2017-07-22 DIAGNOSIS — G4733 Obstructive sleep apnea (adult) (pediatric): Secondary | ICD-10-CM | POA: Diagnosis not present

## 2017-07-22 DIAGNOSIS — Z7982 Long term (current) use of aspirin: Secondary | ICD-10-CM | POA: Diagnosis not present

## 2017-07-22 DIAGNOSIS — Z9181 History of falling: Secondary | ICD-10-CM | POA: Diagnosis not present

## 2017-07-22 DIAGNOSIS — I350 Nonrheumatic aortic (valve) stenosis: Secondary | ICD-10-CM | POA: Diagnosis not present

## 2017-07-22 DIAGNOSIS — Z8673 Personal history of transient ischemic attack (TIA), and cerebral infarction without residual deficits: Secondary | ICD-10-CM | POA: Diagnosis not present

## 2017-07-22 DIAGNOSIS — I10 Essential (primary) hypertension: Secondary | ICD-10-CM | POA: Diagnosis not present

## 2017-07-22 DIAGNOSIS — M5136 Other intervertebral disc degeneration, lumbar region: Secondary | ICD-10-CM | POA: Diagnosis not present

## 2017-07-22 DIAGNOSIS — Z87440 Personal history of urinary (tract) infections: Secondary | ICD-10-CM | POA: Diagnosis not present

## 2017-07-22 DIAGNOSIS — I2510 Atherosclerotic heart disease of native coronary artery without angina pectoris: Secondary | ICD-10-CM | POA: Diagnosis not present

## 2017-07-24 DIAGNOSIS — I252 Old myocardial infarction: Secondary | ICD-10-CM | POA: Diagnosis not present

## 2017-07-24 DIAGNOSIS — I10 Essential (primary) hypertension: Secondary | ICD-10-CM | POA: Diagnosis not present

## 2017-07-24 DIAGNOSIS — E119 Type 2 diabetes mellitus without complications: Secondary | ICD-10-CM | POA: Diagnosis not present

## 2017-07-24 DIAGNOSIS — I8500 Esophageal varices without bleeding: Secondary | ICD-10-CM | POA: Diagnosis not present

## 2017-07-24 DIAGNOSIS — K7469 Other cirrhosis of liver: Secondary | ICD-10-CM | POA: Diagnosis not present

## 2017-07-27 ENCOUNTER — Other Ambulatory Visit: Payer: Self-pay | Admitting: *Deleted

## 2017-07-27 DIAGNOSIS — I10 Essential (primary) hypertension: Secondary | ICD-10-CM | POA: Diagnosis not present

## 2017-07-27 DIAGNOSIS — G4733 Obstructive sleep apnea (adult) (pediatric): Secondary | ICD-10-CM | POA: Diagnosis not present

## 2017-07-27 DIAGNOSIS — G4701 Insomnia due to medical condition: Secondary | ICD-10-CM | POA: Diagnosis not present

## 2017-07-27 DIAGNOSIS — I2510 Atherosclerotic heart disease of native coronary artery without angina pectoris: Secondary | ICD-10-CM | POA: Diagnosis not present

## 2017-07-27 DIAGNOSIS — M5136 Other intervertebral disc degeneration, lumbar region: Secondary | ICD-10-CM | POA: Diagnosis not present

## 2017-07-27 DIAGNOSIS — S72302D Unspecified fracture of shaft of left femur, subsequent encounter for closed fracture with routine healing: Secondary | ICD-10-CM | POA: Diagnosis not present

## 2017-07-27 DIAGNOSIS — D649 Anemia, unspecified: Secondary | ICD-10-CM | POA: Diagnosis not present

## 2017-07-27 DIAGNOSIS — Z87440 Personal history of urinary (tract) infections: Secondary | ICD-10-CM | POA: Diagnosis not present

## 2017-07-27 DIAGNOSIS — E1140 Type 2 diabetes mellitus with diabetic neuropathy, unspecified: Secondary | ICD-10-CM | POA: Diagnosis not present

## 2017-07-27 DIAGNOSIS — Z9181 History of falling: Secondary | ICD-10-CM | POA: Diagnosis not present

## 2017-07-27 DIAGNOSIS — K7581 Nonalcoholic steatohepatitis (NASH): Secondary | ICD-10-CM | POA: Diagnosis not present

## 2017-07-27 DIAGNOSIS — Z8673 Personal history of transient ischemic attack (TIA), and cerebral infarction without residual deficits: Secondary | ICD-10-CM | POA: Diagnosis not present

## 2017-07-27 DIAGNOSIS — I350 Nonrheumatic aortic (valve) stenosis: Secondary | ICD-10-CM | POA: Diagnosis not present

## 2017-07-27 DIAGNOSIS — Z7982 Long term (current) use of aspirin: Secondary | ICD-10-CM | POA: Diagnosis not present

## 2017-07-27 DIAGNOSIS — E1151 Type 2 diabetes mellitus with diabetic peripheral angiopathy without gangrene: Secondary | ICD-10-CM | POA: Diagnosis not present

## 2017-07-27 DIAGNOSIS — I69344 Monoplegia of lower limb following cerebral infarction affecting left non-dominant side: Secondary | ICD-10-CM | POA: Diagnosis not present

## 2017-07-27 NOTE — Patient Outreach (Signed)
[facility]) Care Management  07/27/2017  Madysun Thall 06-13-1941 [id number]   Care coordination   [facility] CM spoke with Mrs Kettlewell to reschedule upcoming home visit  CM followed up on her 07/17/17  neurology appointment.  She reports being "impressed with him" Referring to Dr Merlene Laughter.  Reports he discussed her recent illness related to cardiac history and a vessel occlusion.  She reports her cholesterol was "normal" before her stroke.  Change in treatment plan includes increase in her dilantin. She confirms she had been seen by Dr Harl Bowie Cardiologist but not recently.  Follow up with her pcp appointment includes addition of lasix by Marisue Brooklyn resulting in her edema decrease  Continues to be seen by HHPT with good results She has not had an update form [facility] on her AFO at this time   Plans Follow up home visit with Mrs Guttman in 7-10 days  Spoke with shanicka at [facility] working on Mrs Gallon AFO - will contact her when finished to get her in the office for [place]. Lavina Hamman, RN, BSN, [place] Care Management [phone]

## 2017-07-29 DIAGNOSIS — I10 Essential (primary) hypertension: Secondary | ICD-10-CM | POA: Diagnosis not present

## 2017-07-29 DIAGNOSIS — Z7982 Long term (current) use of aspirin: Secondary | ICD-10-CM | POA: Diagnosis not present

## 2017-07-29 DIAGNOSIS — I350 Nonrheumatic aortic (valve) stenosis: Secondary | ICD-10-CM | POA: Diagnosis not present

## 2017-07-29 DIAGNOSIS — Z9181 History of falling: Secondary | ICD-10-CM | POA: Diagnosis not present

## 2017-07-29 DIAGNOSIS — G4733 Obstructive sleep apnea (adult) (pediatric): Secondary | ICD-10-CM | POA: Diagnosis not present

## 2017-07-29 DIAGNOSIS — E1151 Type 2 diabetes mellitus with diabetic peripheral angiopathy without gangrene: Secondary | ICD-10-CM | POA: Diagnosis not present

## 2017-07-29 DIAGNOSIS — K7581 Nonalcoholic steatohepatitis (NASH): Secondary | ICD-10-CM | POA: Diagnosis not present

## 2017-07-29 DIAGNOSIS — S72302D Unspecified fracture of shaft of left femur, subsequent encounter for closed fracture with routine healing: Secondary | ICD-10-CM | POA: Diagnosis not present

## 2017-07-29 DIAGNOSIS — Z87440 Personal history of urinary (tract) infections: Secondary | ICD-10-CM | POA: Diagnosis not present

## 2017-07-29 DIAGNOSIS — I2510 Atherosclerotic heart disease of native coronary artery without angina pectoris: Secondary | ICD-10-CM | POA: Diagnosis not present

## 2017-07-29 DIAGNOSIS — D649 Anemia, unspecified: Secondary | ICD-10-CM | POA: Diagnosis not present

## 2017-07-29 DIAGNOSIS — Z8673 Personal history of transient ischemic attack (TIA), and cerebral infarction without residual deficits: Secondary | ICD-10-CM | POA: Diagnosis not present

## 2017-07-29 DIAGNOSIS — M5136 Other intervertebral disc degeneration, lumbar region: Secondary | ICD-10-CM | POA: Diagnosis not present

## 2017-08-04 ENCOUNTER — Ambulatory Visit: Payer: Self-pay | Admitting: *Deleted

## 2017-08-04 ENCOUNTER — Other Ambulatory Visit: Payer: Self-pay | Admitting: *Deleted

## 2017-08-04 DIAGNOSIS — G4733 Obstructive sleep apnea (adult) (pediatric): Secondary | ICD-10-CM | POA: Diagnosis not present

## 2017-08-04 DIAGNOSIS — M5136 Other intervertebral disc degeneration, lumbar region: Secondary | ICD-10-CM | POA: Diagnosis not present

## 2017-08-04 DIAGNOSIS — D649 Anemia, unspecified: Secondary | ICD-10-CM | POA: Diagnosis not present

## 2017-08-04 DIAGNOSIS — Z8673 Personal history of transient ischemic attack (TIA), and cerebral infarction without residual deficits: Secondary | ICD-10-CM | POA: Diagnosis not present

## 2017-08-04 DIAGNOSIS — K7581 Nonalcoholic steatohepatitis (NASH): Secondary | ICD-10-CM | POA: Diagnosis not present

## 2017-08-04 DIAGNOSIS — I350 Nonrheumatic aortic (valve) stenosis: Secondary | ICD-10-CM | POA: Diagnosis not present

## 2017-08-04 DIAGNOSIS — S72302D Unspecified fracture of shaft of left femur, subsequent encounter for closed fracture with routine healing: Secondary | ICD-10-CM | POA: Diagnosis not present

## 2017-08-04 DIAGNOSIS — I10 Essential (primary) hypertension: Secondary | ICD-10-CM | POA: Diagnosis not present

## 2017-08-04 DIAGNOSIS — Z7982 Long term (current) use of aspirin: Secondary | ICD-10-CM | POA: Diagnosis not present

## 2017-08-04 DIAGNOSIS — Z87440 Personal history of urinary (tract) infections: Secondary | ICD-10-CM | POA: Diagnosis not present

## 2017-08-04 DIAGNOSIS — M21372 Foot drop, left foot: Secondary | ICD-10-CM | POA: Diagnosis not present

## 2017-08-04 DIAGNOSIS — E1151 Type 2 diabetes mellitus with diabetic peripheral angiopathy without gangrene: Secondary | ICD-10-CM | POA: Diagnosis not present

## 2017-08-04 DIAGNOSIS — I2510 Atherosclerotic heart disease of native coronary artery without angina pectoris: Secondary | ICD-10-CM | POA: Diagnosis not present

## 2017-08-04 DIAGNOSIS — Z9181 History of falling: Secondary | ICD-10-CM | POA: Diagnosis not present

## 2017-08-04 NOTE — Patient Outreach (Signed)
[facility]) Care Management   08/04/2017  [name] 03/31/1941 [id number]  [name] is an 76 y.o. female with medical history significant of anemia, aortic insufficiency, back pain carotid artery stenosis, liver cirrhosis due to NASH, esophageal varices, CAD, lumbar DDD, essential hypertension, GERD/hiatal hernia, history of CVA, hyperlipidemia, obstructive sleep apnea, PSVT, recurring UTIs, type 2 diabetes, and falls in June and August 2018 resulting in a  Closed left femur fracture  [name] was referred to [facility] CM for transition of care calls by Gildardo Griffes, [facility] SW after she was discharged from [facility] SN[facility] on 06/22/17 - (need RN CM follow upfor Transitions of Care)  Transition of care services have been completed and she is now being followed for [facility] CM community complex services  Subjective: see notes below Objective:  BP 120/78   Pulse 61   Temp (!) 96.6 F (35.9 C) (Oral)   Resp 20   Wt 133 lb 12.8 oz (60.7 kg)   SpO2 98%   BMI 23.70 kg/m    Review of Systems  Constitutional: Negative for chills, diaphoresis, fever, malaise/fatigue and weight loss.  HENT: Negative.  Negative for congestion, ear discharge, ear pain, hearing loss, nosebleeds, sinus pain, sore throat and tinnitus.   Eyes: Negative.  Negative for blurred vision, double vision, photophobia, pain, discharge and redness.  Respiratory: Negative.  Negative for cough, hemoptysis, sputum production, shortness of breath, wheezing and stridor.   Cardiovascular: Positive for leg swelling. Negative for chest pain, palpitations, orthopnea, claudication and PND.       Left leg swelling Has  Been up all day per patient  Gastrointestinal: Negative.  Negative for abdominal pain, blood in stool, constipation, diarrhea, heartburn, melena, nausea and vomiting.  Genitourinary: Negative.  Negative for dysuria, flank pain, frequency, hematuria and urgency.  Musculoskeletal: Positive for joint pain.  Negative for falls.  Skin: Negative.  Negative for itching and rash.  Neurological: Positive for weakness.  Endo/Heme/Allergies: Bruises/bleeds easily.  Psychiatric/Behavioral: Negative.  Negative for depression, hallucinations, memory loss, substance abuse and suicidal ideas. The patient is not nervous/anxious and does not have insomnia.     Physical Exam  Constitutional: She is oriented to person, place, and time. She appears well-developed and well-nourished.  HENT:  Head: Normocephalic and atraumatic.  Eyes: Conjunctivae are normal. Pupils are equal, round, and reactive to light.  Neck: Normal range of motion. Neck supple.  Cardiovascular: Normal rate and regular rhythm.  Respiratory: Effort normal and breath sounds normal.  GI: Soft. Bowel sounds are normal.  Musculoskeletal: Normal range of motion.  Neurological: She is alert and oriented to person, place, and time.  Skin: Skin is warm and dry.  Psychiatric: She has a normal mood and affect. Her behavior is normal. Judgment and thought content normal.    Encounter Medications:   Outpatient Encounter Medications as of 08/04/2017  Medication Sig Note  . acetaminophen (TYLENOL) 325 MG tablet Take 2 tablets (650 mg total) by mouth every 6 (six) hours as needed for mild pain (or Fever >/= 101). (Patient not taking: Reported on 07/21/2017) 07/21/2017: Do not take tylenol Not effective per patient she prefers motrin  . Amino Acids-Protein Hydrolys (FEEDING SUPPLEMENT, PRO-STAT SUGAR FREE 64,) LIQD Take 30 mLs by mouth 2 (two) times daily.   Marland Kitchen aspirin EC 81 MG tablet Take 81 mg by mouth daily.   Marland Kitchen atorvastatin (LIPITOR) 40 MG tablet Take 40 mg by mouth daily.   . carvedilol (COREG) 12.5 MG tablet Take 12.5  mg by mouth 2 (two) times daily with a meal.    . diltiazem (CARDIZEM CD) 120 MG 24 hr capsule Take 120 mg by mouth daily.   . ferrous sulfate 325 (65 FE) MG tablet Take 1 tablet (325 mg total) by mouth 3 (three) times daily after  meals.   . folic acid (FOLVITE) 1 MG tablet Take 1 tablet (1 mg total) by mouth daily.   Marland Kitchen ibuprofen (ADVIL) 200 MG tablet Take 200 mg by mouth every 6 (six) hours as needed for headache or moderate pain.    . isosorbide mononitrate (IMDUR) 30 MG 24 hr tablet Take 30 mg by mouth daily.    Marland Kitchen lisinopril (PRINIVIL,ZESTRIL) 20 MG tablet Take 20 mg by mouth daily.   . ondansetron (ZOFRAN) 4 MG tablet Take 4 mg by mouth every 8 (eight) hours as needed for nausea or vomiting.   . pantoprazole (PROTONIX) 40 MG tablet Take 40 mg by mouth daily.   . phenytoin (DILANTIN) 100 MG ER capsule Take 100 mg by mouth 2 (two) times daily. 06/26/2017: Taking and recommended to take dilantin 100 mg bid by pcp office   . polyethylene glycol (MIRALAX / GLYCOLAX) packet Take 17 g by mouth daily.   . potassium chloride SA (K-DUR,KLOR-CON) 20 MEQ tablet Take 1 tablet (20 mEq total) by mouth daily. 06/26/2017: Taking 20 mEQ   . vitamin C (ASCORBIC ACID) 500 MG tablet Take 500 mg by mouth 3 (three) times daily.   Marland Kitchen zolpidem (AMBIEN) 10 MG tablet take 1 tablet by mouth at bedtime if needed    No facility-administered encounter medications on file as of 08/04/2017.     Functional Status:   In your present state of health, do you have any difficulty performing the following activities: 07/06/2017 06/26/2017  Hearing? N N  Vision? N N  Difficulty concentrating or making decisions? N N  Walking or climbing stairs? Y Y  Dressing or bathing? Y Y  Doing errands, shopping? [name]  Preparing Food and eating ? N Y  Using the Toilet? N Y  In the past six months, have you accidently leaked urine? N Y  Do you have problems with loss of bowel control? N Y  Managing your Medications? N Y  Managing your Finances? N Y  Housekeeping or managing your Housekeeping? N Y  Some recent data might be hidden    Fall/Depression Screening:    Fall Risk  07/06/2017 03/09/2017  Falls in the past year? Yes Yes  Number falls in past yr: 2 or  more 1  Injury with Fall? Yes Yes  Risk Factor Category  High Fall Risk -  Risk for fall due to : History of fall(s) Impaired balance/gait;Impaired mobility  Risk for fall due to: Comment history of one fall in 2018 -  Follow up - Falls prevention discussed   PHQ 2/9 Scores 07/06/2017 03/09/2017  PHQ - 2 Score 0 0    Assessment:    [facility] CM assisted by retrieving Barbara Varma AFO from [facility] today and bringing it to her during the CM home visit  Her son and his girlfriend plus Montine Circle from home health services are present when Cm reviewed how [facility] staff request left AFO be applied  Barbara Nicol confirms she saw Aldona Bar at pcp office in the last 2 weeks after an e-mail was sent to the [place] office on 07/21/17 related to a running nose (her granddaughter was home from school with a cold on the same  day) for about a week, nausea with questionable source (Barbara Mcinroy thought it was related to constipation) and 2 + edema of the left leg   CM called the pcp office and spoke with cecila to discuss the continue edema of her left foot Encouraged elevation of her left foot while in her w/c or resting in her bed  Wt 133.8 lbs today, 128.8 on 07/29/17    Plan: follow up in 2-4 weeks   Baljit Liebert L. Lavina Hamman, RN, BSN, [place] Care Management [phone]

## 2017-08-08 DIAGNOSIS — Z13820 Encounter for screening for osteoporosis: Secondary | ICD-10-CM | POA: Diagnosis not present

## 2017-08-08 DIAGNOSIS — M899 Disorder of bone, unspecified: Secondary | ICD-10-CM | POA: Diagnosis not present

## 2017-08-10 DIAGNOSIS — M5136 Other intervertebral disc degeneration, lumbar region: Secondary | ICD-10-CM | POA: Diagnosis not present

## 2017-08-10 DIAGNOSIS — Z87440 Personal history of urinary (tract) infections: Secondary | ICD-10-CM | POA: Diagnosis not present

## 2017-08-10 DIAGNOSIS — G4733 Obstructive sleep apnea (adult) (pediatric): Secondary | ICD-10-CM | POA: Diagnosis not present

## 2017-08-10 DIAGNOSIS — E1151 Type 2 diabetes mellitus with diabetic peripheral angiopathy without gangrene: Secondary | ICD-10-CM | POA: Diagnosis not present

## 2017-08-10 DIAGNOSIS — I10 Essential (primary) hypertension: Secondary | ICD-10-CM | POA: Diagnosis not present

## 2017-08-10 DIAGNOSIS — I350 Nonrheumatic aortic (valve) stenosis: Secondary | ICD-10-CM | POA: Diagnosis not present

## 2017-08-10 DIAGNOSIS — S72302D Unspecified fracture of shaft of left femur, subsequent encounter for closed fracture with routine healing: Secondary | ICD-10-CM | POA: Diagnosis not present

## 2017-08-10 DIAGNOSIS — Z9181 History of falling: Secondary | ICD-10-CM | POA: Diagnosis not present

## 2017-08-10 DIAGNOSIS — Z7982 Long term (current) use of aspirin: Secondary | ICD-10-CM | POA: Diagnosis not present

## 2017-08-10 DIAGNOSIS — K7581 Nonalcoholic steatohepatitis (NASH): Secondary | ICD-10-CM | POA: Diagnosis not present

## 2017-08-10 DIAGNOSIS — I2510 Atherosclerotic heart disease of native coronary artery without angina pectoris: Secondary | ICD-10-CM | POA: Diagnosis not present

## 2017-08-10 DIAGNOSIS — D649 Anemia, unspecified: Secondary | ICD-10-CM | POA: Diagnosis not present

## 2017-08-10 DIAGNOSIS — Z8673 Personal history of transient ischemic attack (TIA), and cerebral infarction without residual deficits: Secondary | ICD-10-CM | POA: Diagnosis not present

## 2017-08-12 ENCOUNTER — Other Ambulatory Visit: Payer: Self-pay | Admitting: *Deleted

## 2017-08-12 DIAGNOSIS — Z8673 Personal history of transient ischemic attack (TIA), and cerebral infarction without residual deficits: Secondary | ICD-10-CM | POA: Diagnosis not present

## 2017-08-12 DIAGNOSIS — M5136 Other intervertebral disc degeneration, lumbar region: Secondary | ICD-10-CM | POA: Diagnosis not present

## 2017-08-12 DIAGNOSIS — Z9181 History of falling: Secondary | ICD-10-CM | POA: Diagnosis not present

## 2017-08-12 DIAGNOSIS — Z7982 Long term (current) use of aspirin: Secondary | ICD-10-CM | POA: Diagnosis not present

## 2017-08-12 DIAGNOSIS — I350 Nonrheumatic aortic (valve) stenosis: Secondary | ICD-10-CM | POA: Diagnosis not present

## 2017-08-12 DIAGNOSIS — I10 Essential (primary) hypertension: Secondary | ICD-10-CM | POA: Diagnosis not present

## 2017-08-12 DIAGNOSIS — S72302D Unspecified fracture of shaft of left femur, subsequent encounter for closed fracture with routine healing: Secondary | ICD-10-CM | POA: Diagnosis not present

## 2017-08-12 DIAGNOSIS — D649 Anemia, unspecified: Secondary | ICD-10-CM | POA: Diagnosis not present

## 2017-08-12 DIAGNOSIS — Z87440 Personal history of urinary (tract) infections: Secondary | ICD-10-CM | POA: Diagnosis not present

## 2017-08-12 DIAGNOSIS — G4733 Obstructive sleep apnea (adult) (pediatric): Secondary | ICD-10-CM | POA: Diagnosis not present

## 2017-08-12 DIAGNOSIS — E1151 Type 2 diabetes mellitus with diabetic peripheral angiopathy without gangrene: Secondary | ICD-10-CM | POA: Diagnosis not present

## 2017-08-12 DIAGNOSIS — I2510 Atherosclerotic heart disease of native coronary artery without angina pectoris: Secondary | ICD-10-CM | POA: Diagnosis not present

## 2017-08-12 DIAGNOSIS — K7581 Nonalcoholic steatohepatitis (NASH): Secondary | ICD-10-CM | POA: Diagnosis not present

## 2017-08-12 NOTE — Patient Outreach (Signed)
[facility]) Care Management  08/12/2017  Milanni Ayub 16-Nov-1940 [id number]   Care coordination  Cm spoke with Mrs Routt who states she is doing very well with her AFO.  Reports use with HHPT sessions.  States Edema decreased Rescheduled time of next week home visit  Plan follow up next week for home visit  [place]. Lavina Hamman, RN, BSN, [place] Care Management [phone]

## 2017-08-18 ENCOUNTER — Ambulatory Visit: Payer: Self-pay | Admitting: *Deleted

## 2017-08-18 ENCOUNTER — Other Ambulatory Visit: Payer: Self-pay | Admitting: *Deleted

## 2017-08-18 DIAGNOSIS — I10 Essential (primary) hypertension: Secondary | ICD-10-CM | POA: Diagnosis not present

## 2017-08-18 DIAGNOSIS — Z87440 Personal history of urinary (tract) infections: Secondary | ICD-10-CM | POA: Diagnosis not present

## 2017-08-18 DIAGNOSIS — Z8673 Personal history of transient ischemic attack (TIA), and cerebral infarction without residual deficits: Secondary | ICD-10-CM | POA: Diagnosis not present

## 2017-08-18 DIAGNOSIS — S72302D Unspecified fracture of shaft of left femur, subsequent encounter for closed fracture with routine healing: Secondary | ICD-10-CM | POA: Diagnosis not present

## 2017-08-18 DIAGNOSIS — I350 Nonrheumatic aortic (valve) stenosis: Secondary | ICD-10-CM | POA: Diagnosis not present

## 2017-08-18 DIAGNOSIS — E1151 Type 2 diabetes mellitus with diabetic peripheral angiopathy without gangrene: Secondary | ICD-10-CM | POA: Diagnosis not present

## 2017-08-18 DIAGNOSIS — Z7982 Long term (current) use of aspirin: Secondary | ICD-10-CM | POA: Diagnosis not present

## 2017-08-18 DIAGNOSIS — I2510 Atherosclerotic heart disease of native coronary artery without angina pectoris: Secondary | ICD-10-CM | POA: Diagnosis not present

## 2017-08-18 DIAGNOSIS — D649 Anemia, unspecified: Secondary | ICD-10-CM | POA: Diagnosis not present

## 2017-08-18 DIAGNOSIS — K7581 Nonalcoholic steatohepatitis (NASH): Secondary | ICD-10-CM | POA: Diagnosis not present

## 2017-08-18 DIAGNOSIS — M5136 Other intervertebral disc degeneration, lumbar region: Secondary | ICD-10-CM | POA: Diagnosis not present

## 2017-08-18 DIAGNOSIS — G4733 Obstructive sleep apnea (adult) (pediatric): Secondary | ICD-10-CM | POA: Diagnosis not present

## 2017-08-18 DIAGNOSIS — Z9181 History of falling: Secondary | ICD-10-CM | POA: Diagnosis not present

## 2017-08-18 NOTE — Patient Outreach (Signed)
[facility]) Care Management   08/18/2017  [name] 1940/12/17 [id number]  [name] is an 76 y.o. female with medical history significant of anemia, aortic insufficiency, back pain carotid artery stenosis, liver cirrhosis due to NASH, esophageal varices, CAD, lumbar DDD, essential hypertension, GERD/hiatal hernia, history of CVA, hyperlipidemia, obstructive sleep apnea, PSVT, recurring UTIs, type 2 diabetes,and falls in June and August 2018 resulting in a Closed left femur fracture  [name] was referred to [facility] CM for transition of care calls by Gildardo Griffes, [facility] SW after she was discharged from [facility] SN[facility] on 06/22/17 - (need RN CM follow upfor Transitions of Care)  Transition of care services have been completed and she is now being followed for [facility] CM community complex services  Subjective:  See notes below   Objective:   BP 124/78   Pulse 71   Temp 98 F (36.7 C) (Oral)   Resp 20   Wt 126 lb (57.2 kg)   SpO2 97%   BMI 22.32 kg/m   Review of Systems  Constitutional: Negative for chills, diaphoresis, fever, malaise/fatigue and weight loss.  HENT: Negative.  Negative for congestion, ear discharge, ear pain, hearing loss, nosebleeds, sinus pain, sore throat and tinnitus.   Eyes: Negative.   Respiratory: Negative.  Negative for cough, hemoptysis, sputum production, shortness of breath, wheezing and stridor.   Cardiovascular: Positive for leg swelling. Negative for chest pain, palpitations, orthopnea and claudication.  Gastrointestinal: Positive for abdominal pain. Negative for blood in stool, constipation, diarrhea, heartburn, melena, nausea and vomiting.  Genitourinary: Negative.  Negative for dysuria, flank pain, frequency, hematuria and urgency.  Musculoskeletal: Positive for joint pain.  Skin: Negative.  Negative for itching and rash.  Neurological: Positive for weakness. Negative for dizziness, tingling, tremors, sensory change, speech  change, focal weakness, seizures, loss of consciousness and headaches.  Endo/Heme/Allergies: Negative for environmental allergies and polydipsia. Bruises/bleeds easily.  Psychiatric/Behavioral: Negative.  Negative for depression, hallucinations, memory loss, substance abuse and suicidal ideas. The patient is not nervous/anxious and does not have insomnia.     Physical Exam  Constitutional: She is oriented to person, place, and time. She appears well-developed and well-nourished.  HENT:  Head: Normocephalic and atraumatic.  Eyes: Pupils are equal, round, and reactive to light.  Neck: Neck supple.  Cardiovascular: Normal rate.  Respiratory: Effort normal and breath sounds normal.  GI: Soft.  Musculoskeletal: She exhibits edema.  Neurological: She is alert and oriented to person, place, and time.  Skin: Skin is warm and dry.  Psychiatric: She has a normal mood and affect. Her behavior is normal. Judgment and thought content normal.    Encounter Medications:   Outpatient Encounter Medications as of 08/18/2017  Medication Sig Note  . acetaminophen (TYLENOL) 325 MG tablet Take 2 tablets (650 mg total) by mouth every 6 (six) hours as needed for mild pain (or Fever >/= 101). (Patient not taking: Reported on 07/21/2017) 07/21/2017: Do not take tylenol Not effective per patient she prefers motrin  . Amino Acids-Protein Hydrolys (FEEDING SUPPLEMENT, PRO-STAT SUGAR FREE 64,) LIQD Take 30 mLs by mouth 2 (two) times daily.   Marland Kitchen aspirin EC 81 MG tablet Take 81 mg by mouth daily.   Marland Kitchen atorvastatin (LIPITOR) 40 MG tablet Take 40 mg by mouth daily.   . carvedilol (COREG) 12.5 MG tablet Take 12.5 mg by mouth 2 (two) times daily with a meal.    . diltiazem (CARDIZEM CD) 120 MG 24 hr capsule Take 120 mg by  mouth daily.   . ferrous sulfate 325 (65 FE) MG tablet Take 1 tablet (325 mg total) by mouth 3 (three) times daily after meals.   . folic acid (FOLVITE) 1 MG tablet Take 1 tablet (1 mg total) by mouth  daily.   Marland Kitchen ibuprofen (ADVIL) 200 MG tablet Take 200 mg by mouth every 6 (six) hours as needed for headache or moderate pain.    . isosorbide mononitrate (IMDUR) 30 MG 24 hr tablet Take 30 mg by mouth daily.    Marland Kitchen lisinopril (PRINIVIL,ZESTRIL) 20 MG tablet Take 20 mg by mouth daily.   . ondansetron (ZOFRAN) 4 MG tablet Take 4 mg by mouth every 8 (eight) hours as needed for nausea or vomiting.   . pantoprazole (PROTONIX) 40 MG tablet Take 40 mg by mouth daily.   . phenytoin (DILANTIN) 100 MG ER capsule Take 100 mg by mouth 2 (two) times daily. 06/26/2017: Taking and recommended to take dilantin 100 mg bid by pcp office   . polyethylene glycol (MIRALAX / GLYCOLAX) packet Take 17 g by mouth daily.   . potassium chloride SA (K-DUR,KLOR-CON) 20 MEQ tablet Take 1 tablet (20 mEq total) by mouth daily. 06/26/2017: Taking 20 mEQ   . vitamin C (ASCORBIC ACID) 500 MG tablet Take 500 mg by mouth 3 (three) times daily.   Marland Kitchen zolpidem (AMBIEN) 10 MG tablet take 1 tablet by mouth at bedtime if needed    No facility-administered encounter medications on file as of 08/18/2017.     Functional Status:   In your present state of health, do you have any difficulty performing the following activities: 07/06/2017 06/26/2017  Hearing? N N  Vision? N N  Difficulty concentrating or making decisions? N N  Walking or climbing stairs? Y Y  Dressing or bathing? Y Y  Doing errands, shopping? [name]  Preparing Food and eating ? N Y  Using the Toilet? N Y  In the past six months, have you accidently leaked urine? N Y  Do you have problems with loss of bowel control? N Y  Managing your Medications? N Y  Managing your Finances? N Y  Housekeeping or managing your Housekeeping? N Y  Some recent data might be hidden    Fall/Depression Screening:    Fall Risk  07/06/2017 03/09/2017  Falls in the past year? Yes Yes  Number falls in past yr: 2 or more 1  Injury with Fall? Yes Yes  Risk Factor Category  High Fall Risk -  Risk  for fall due to : History of fall(s) Impaired balance/gait;Impaired mobility  Risk for fall due to: Comment history of one fall in 2018 -  Follow up - Falls prevention discussed   PHQ 2/9 Scores 07/06/2017 03/09/2017  PHQ - 2 Score 0 0    Assessment:    [facility] CM visited Barbara Freedman at her home briefly after inclement weather.  She is unable to leave her home related to the inclement weather  Edema/mobility- CM discussed the need for an orthopedic follow up She reports she has previously seen by Dr Luna Glasgow  Minimal left leg swelling today but painful to touch but not warm to touch CM question if she may have a hairline fx but she denies any injury to the site. Encouraged RICE (rest, ice, compression and elevation) with Barbara Celaya and her grand daughter, [name] She reports she is tolerating the left AFO well   HHPT -She was seen today by HHPT who encouraged her to wear the  AFO one hour on, one hour off.  She was able to tolerate her exercises with the HHPT today.  Plan: Encouraged Barbara Tetterton and her son to follow up with her pcp related to her painful left foot by the end of the week as soon as she can get out of her home related to the inclement weather Follow up with Barbara Bowland in 1-2 weeks   Joelene Millin L. Lavina Hamman, RN, BSN, [place] Care Management [phone]

## 2017-08-19 DIAGNOSIS — M5136 Other intervertebral disc degeneration, lumbar region: Secondary | ICD-10-CM | POA: Diagnosis not present

## 2017-08-19 DIAGNOSIS — I10 Essential (primary) hypertension: Secondary | ICD-10-CM | POA: Diagnosis not present

## 2017-08-19 DIAGNOSIS — Z87440 Personal history of urinary (tract) infections: Secondary | ICD-10-CM | POA: Diagnosis not present

## 2017-08-19 DIAGNOSIS — D649 Anemia, unspecified: Secondary | ICD-10-CM | POA: Diagnosis not present

## 2017-08-19 DIAGNOSIS — E1151 Type 2 diabetes mellitus with diabetic peripheral angiopathy without gangrene: Secondary | ICD-10-CM | POA: Diagnosis not present

## 2017-08-19 DIAGNOSIS — Z9181 History of falling: Secondary | ICD-10-CM | POA: Diagnosis not present

## 2017-08-19 DIAGNOSIS — I350 Nonrheumatic aortic (valve) stenosis: Secondary | ICD-10-CM | POA: Diagnosis not present

## 2017-08-19 DIAGNOSIS — G4733 Obstructive sleep apnea (adult) (pediatric): Secondary | ICD-10-CM | POA: Diagnosis not present

## 2017-08-19 DIAGNOSIS — K7581 Nonalcoholic steatohepatitis (NASH): Secondary | ICD-10-CM | POA: Diagnosis not present

## 2017-08-19 DIAGNOSIS — Z7982 Long term (current) use of aspirin: Secondary | ICD-10-CM | POA: Diagnosis not present

## 2017-08-19 DIAGNOSIS — I2510 Atherosclerotic heart disease of native coronary artery without angina pectoris: Secondary | ICD-10-CM | POA: Diagnosis not present

## 2017-08-19 DIAGNOSIS — S72302D Unspecified fracture of shaft of left femur, subsequent encounter for closed fracture with routine healing: Secondary | ICD-10-CM | POA: Diagnosis not present

## 2017-08-19 DIAGNOSIS — Z8673 Personal history of transient ischemic attack (TIA), and cerebral infarction without residual deficits: Secondary | ICD-10-CM | POA: Diagnosis not present

## 2017-08-24 ENCOUNTER — Other Ambulatory Visit: Payer: Self-pay

## 2017-08-24 ENCOUNTER — Emergency Department (HOSPITAL_COMMUNITY): Payer: Medicare Other

## 2017-08-24 ENCOUNTER — Encounter (HOSPITAL_COMMUNITY): Payer: Self-pay

## 2017-08-24 ENCOUNTER — Observation Stay (HOSPITAL_COMMUNITY)
Admission: EM | Admit: 2017-08-24 | Discharge: 2017-08-26 | Disposition: A | Discharge status: Home / Self Care | Payer: Medicare Other | Attending: Internal Medicine | Admitting: Internal Medicine

## 2017-08-24 DIAGNOSIS — R55 Syncope and collapse: Secondary | ICD-10-CM | POA: Diagnosis not present

## 2017-08-24 DIAGNOSIS — Z8673 Personal history of transient ischemic attack (TIA), and cerebral infarction without residual deficits: Secondary | ICD-10-CM | POA: Diagnosis not present

## 2017-08-24 DIAGNOSIS — Z9181 History of falling: Secondary | ICD-10-CM | POA: Diagnosis not present

## 2017-08-24 DIAGNOSIS — K7581 Nonalcoholic steatohepatitis (NASH): Secondary | ICD-10-CM | POA: Diagnosis not present

## 2017-08-24 DIAGNOSIS — I471 Supraventricular tachycardia, unspecified: Secondary | ICD-10-CM | POA: Diagnosis present

## 2017-08-24 DIAGNOSIS — G40909 Epilepsy, unspecified, not intractable, without status epilepticus: Secondary | ICD-10-CM | POA: Diagnosis not present

## 2017-08-24 DIAGNOSIS — I350 Nonrheumatic aortic (valve) stenosis: Secondary | ICD-10-CM | POA: Diagnosis not present

## 2017-08-24 DIAGNOSIS — I6523 Occlusion and stenosis of bilateral carotid arteries: Secondary | ICD-10-CM | POA: Insufficient documentation

## 2017-08-24 DIAGNOSIS — E7800 Pure hypercholesterolemia, unspecified: Secondary | ICD-10-CM | POA: Diagnosis not present

## 2017-08-24 DIAGNOSIS — Z7982 Long term (current) use of aspirin: Secondary | ICD-10-CM | POA: Insufficient documentation

## 2017-08-24 DIAGNOSIS — Z79899 Other long term (current) drug therapy: Secondary | ICD-10-CM | POA: Insufficient documentation

## 2017-08-24 DIAGNOSIS — Y639 Failure in dosage during unspecified surgical and medical care: Secondary | ICD-10-CM | POA: Diagnosis not present

## 2017-08-24 DIAGNOSIS — I2510 Atherosclerotic heart disease of native coronary artery without angina pectoris: Secondary | ICD-10-CM | POA: Diagnosis not present

## 2017-08-24 DIAGNOSIS — K7460 Unspecified cirrhosis of liver: Secondary | ICD-10-CM | POA: Diagnosis present

## 2017-08-24 DIAGNOSIS — M5136 Other intervertebral disc degeneration, lumbar region: Secondary | ICD-10-CM | POA: Diagnosis not present

## 2017-08-24 DIAGNOSIS — D696 Thrombocytopenia, unspecified: Secondary | ICD-10-CM | POA: Diagnosis not present

## 2017-08-24 DIAGNOSIS — S72302D Unspecified fracture of shaft of left femur, subsequent encounter for closed fracture with routine healing: Secondary | ICD-10-CM | POA: Diagnosis not present

## 2017-08-24 DIAGNOSIS — E119 Type 2 diabetes mellitus without complications: Secondary | ICD-10-CM | POA: Diagnosis not present

## 2017-08-24 DIAGNOSIS — R531 Weakness: Secondary | ICD-10-CM | POA: Diagnosis not present

## 2017-08-24 DIAGNOSIS — T420X1A Poisoning by hydantoin derivatives, accidental (unintentional), initial encounter: Secondary | ICD-10-CM | POA: Insufficient documentation

## 2017-08-24 DIAGNOSIS — G4733 Obstructive sleep apnea (adult) (pediatric): Secondary | ICD-10-CM | POA: Diagnosis not present

## 2017-08-24 DIAGNOSIS — D649 Anemia, unspecified: Secondary | ICD-10-CM | POA: Diagnosis not present

## 2017-08-24 DIAGNOSIS — I10 Essential (primary) hypertension: Secondary | ICD-10-CM | POA: Insufficient documentation

## 2017-08-24 DIAGNOSIS — R4020 Unspecified coma: Secondary | ICD-10-CM | POA: Diagnosis not present

## 2017-08-24 DIAGNOSIS — R42 Dizziness and giddiness: Secondary | ICD-10-CM | POA: Diagnosis not present

## 2017-08-24 DIAGNOSIS — E1151 Type 2 diabetes mellitus with diabetic peripheral angiopathy without gangrene: Secondary | ICD-10-CM | POA: Diagnosis not present

## 2017-08-24 DIAGNOSIS — R404 Transient alteration of awareness: Secondary | ICD-10-CM | POA: Diagnosis not present

## 2017-08-24 DIAGNOSIS — Z87440 Personal history of urinary (tract) infections: Secondary | ICD-10-CM | POA: Diagnosis not present

## 2017-08-24 LAB — CBC
HCT: 36 % (ref 36.0–46.0)
HEMOGLOBIN: 11.8 g/dL — AB (ref 12.0–15.0)
MCH: 31.7 pg (ref 26.0–34.0)
MCHC: 32.8 g/dL (ref 30.0–36.0)
MCV: 96.8 fL (ref 78.0–100.0)
Platelets: 167 10*3/uL (ref 150–400)
RBC: 3.72 MIL/uL — AB (ref 3.87–5.11)
RDW: 12.8 % (ref 11.5–15.5)
WBC: 4.9 10*3/uL (ref 4.0–10.5)

## 2017-08-24 LAB — URINALYSIS, ROUTINE W REFLEX MICROSCOPIC
BILIRUBIN URINE: NEGATIVE
GLUCOSE, UA: NEGATIVE mg/dL
HGB URINE DIPSTICK: NEGATIVE
KETONES UR: NEGATIVE mg/dL
LEUKOCYTES UA: NEGATIVE
NITRITE: NEGATIVE
PH: 5 (ref 5.0–8.0)
Protein, ur: NEGATIVE mg/dL
Specific Gravity, Urine: 1.01 (ref 1.005–1.030)

## 2017-08-24 LAB — COMPREHENSIVE METABOLIC PANEL
ALK PHOS: 153 U/L — AB (ref 38–126)
ALT: 21 U/L (ref 14–54)
ANION GAP: 6 (ref 5–15)
AST: 29 U/L (ref 15–41)
Albumin: 3.2 g/dL — ABNORMAL LOW (ref 3.5–5.0)
BILIRUBIN TOTAL: 0.7 mg/dL (ref 0.3–1.2)
BUN: 14 mg/dL (ref 6–20)
CALCIUM: 8.4 mg/dL — AB (ref 8.9–10.3)
CO2: 26 mmol/L (ref 22–32)
CREATININE: 0.66 mg/dL (ref 0.44–1.00)
Chloride: 104 mmol/L (ref 101–111)
Glucose, Bld: 147 mg/dL — ABNORMAL HIGH (ref 65–99)
Potassium: 4 mmol/L (ref 3.5–5.1)
Sodium: 136 mmol/L (ref 135–145)
TOTAL PROTEIN: 5.8 g/dL — AB (ref 6.5–8.1)

## 2017-08-24 LAB — CBG MONITORING, ED: Glucose-Capillary: 121 mg/dL — ABNORMAL HIGH (ref 65–99)

## 2017-08-24 LAB — TSH: TSH: 1.225 u[IU]/mL (ref 0.350–4.500)

## 2017-08-24 LAB — PHENYTOIN LEVEL, TOTAL: PHENYTOIN LVL: 39.6 ug/mL — AB (ref 10.0–20.0)

## 2017-08-24 LAB — LIPASE, BLOOD: Lipase: 24 U/L (ref 11–51)

## 2017-08-24 MED ORDER — ZOLPIDEM TARTRATE 5 MG PO TABS
5.0000 mg | ORAL_TABLET | Freq: Every day | ORAL | Status: DC
  Administered 2017-08-24 – 2017-08-25 (×2): 5 mg via ORAL
  Filled 2017-08-24 (×2): qty 1

## 2017-08-24 MED ORDER — MORPHINE SULFATE (PF) 2 MG/ML IV SOLN: 2.0000 mg | INTRAVENOUS | Status: DC | PRN

## 2017-08-24 MED ORDER — POLYETHYLENE GLYCOL 3350 17 G PO PACK: 17.0000 g | PACK | Freq: Every day | ORAL | Status: DC | PRN

## 2017-08-24 MED ORDER — SODIUM CHLORIDE 0.9 % IV BOLUS (SEPSIS)
500.0000 mL | Freq: Once | INTRAVENOUS | Status: AC
  Administered 2017-08-24: 500 mL via INTRAVENOUS

## 2017-08-24 MED ORDER — ASPIRIN EC 81 MG PO TBEC
81.0000 mg | DELAYED_RELEASE_TABLET | Freq: Every day | ORAL | Status: DC
  Administered 2017-08-25 – 2017-08-26 (×2): 81 mg via ORAL
  Filled 2017-08-24 (×2): qty 1

## 2017-08-24 MED ORDER — ATORVASTATIN CALCIUM 40 MG PO TABS
40.0000 mg | ORAL_TABLET | Freq: Every day | ORAL | Status: DC
  Administered 2017-08-24 – 2017-08-25 (×2): 40 mg via ORAL
  Filled 2017-08-24 (×2): qty 1

## 2017-08-24 MED ORDER — VITAMIN C 500 MG PO TABS
500.0000 mg | ORAL_TABLET | Freq: Three times a day (TID) | ORAL | Status: DC
Start: 2017-08-24 — End: 2017-08-26
  Administered 2017-08-24 – 2017-08-26 (×5): 500 mg via ORAL
  Filled 2017-08-24 (×5): qty 1

## 2017-08-24 MED ORDER — DILTIAZEM HCL ER COATED BEADS 120 MG PO CP24
120.0000 mg | ORAL_CAPSULE | Freq: Every day | ORAL | Status: DC
  Administered 2017-08-25 – 2017-08-26 (×2): 120 mg via ORAL
  Filled 2017-08-24 (×2): qty 1

## 2017-08-24 MED ORDER — LISINOPRIL 10 MG PO TABS
20.0000 mg | ORAL_TABLET | Freq: Every day | ORAL | Status: DC
  Administered 2017-08-25 – 2017-08-26 (×2): 20 mg via ORAL
  Filled 2017-08-24 (×2): qty 2

## 2017-08-24 MED ORDER — SODIUM CHLORIDE 0.9 % IV SOLN
INTRAVENOUS | Status: DC
Start: 2017-08-24 — End: 2017-08-26
  Administered 2017-08-24 – 2017-08-25 (×4): via INTRAVENOUS
  Administered 2017-08-26: 75 mL/h via INTRAVENOUS

## 2017-08-24 MED ORDER — ENOXAPARIN SODIUM 40 MG/0.4ML ~~LOC~~ SOLN
40.0000 mg | SUBCUTANEOUS | Status: DC
  Administered 2017-08-24 – 2017-08-25 (×2): 40 mg via SUBCUTANEOUS
  Filled 2017-08-24 (×2): qty 0.4

## 2017-08-24 MED ORDER — CARVEDILOL 12.5 MG PO TABS
12.5000 mg | ORAL_TABLET | Freq: Two times a day (BID) | ORAL | Status: DC
  Administered 2017-08-24 – 2017-08-26 (×4): 12.5 mg via ORAL
  Filled 2017-08-24 (×4): qty 1

## 2017-08-24 MED ORDER — SODIUM CHLORIDE 0.9 % IV BOLUS (SEPSIS): 250.0000 mL | Freq: Once | INTRAVENOUS | Status: DC

## 2017-08-24 MED ORDER — ONDANSETRON HCL 4 MG/2ML IJ SOLN
4.0000 mg | Freq: Once | INTRAMUSCULAR | Status: AC
  Administered 2017-08-24: 4 mg via INTRAVENOUS
  Filled 2017-08-24: qty 2

## 2017-08-24 MED ORDER — ONDANSETRON HCL 4 MG PO TABS: 4.0000 mg | ORAL_TABLET | Freq: Four times a day (QID) | ORAL | Status: DC | PRN

## 2017-08-24 MED ORDER — ISOSORBIDE MONONITRATE ER 30 MG PO TB24
30.0000 mg | ORAL_TABLET | Freq: Every day | ORAL | Status: DC
  Administered 2017-08-25 – 2017-08-26 (×2): 30 mg via ORAL
  Filled 2017-08-24 (×2): qty 1

## 2017-08-24 MED ORDER — ACETAMINOPHEN 650 MG RE SUPP: 650.0000 mg | Freq: Four times a day (QID) | RECTAL | Status: DC | PRN

## 2017-08-24 MED ORDER — ACETAMINOPHEN 325 MG PO TABS: 650.0000 mg | ORAL_TABLET | Freq: Four times a day (QID) | ORAL | Status: DC | PRN

## 2017-08-24 MED ORDER — ONDANSETRON HCL 4 MG/2ML IJ SOLN: 4.0000 mg | Freq: Four times a day (QID) | INTRAMUSCULAR | Status: DC | PRN

## 2017-08-24 MED ORDER — SODIUM CHLORIDE 0.9 % IV SOLN: INTRAVENOUS | Status: DC

## 2017-08-24 MED ORDER — SODIUM CHLORIDE 0.9% FLUSH
3.0000 mL | Freq: Two times a day (BID) | INTRAVENOUS | Status: DC
  Administered 2017-08-24 – 2017-08-25 (×2): 3 mL via INTRAVENOUS

## 2017-08-24 MED ORDER — PHENYTOIN SODIUM EXTENDED 100 MG PO CAPS
100.0000 mg | ORAL_CAPSULE | Freq: Two times a day (BID) | ORAL | Status: DC
  Filled 2017-08-24 (×3): qty 1

## 2017-08-24 MED ORDER — ONDANSETRON HCL 4 MG/2ML IJ SOLN
INTRAMUSCULAR | Status: AC
  Administered 2017-08-24: 4 mg
  Filled 2017-08-24: qty 2

## 2017-08-24 MED ORDER — DOCUSATE SODIUM 100 MG PO CAPS
100.0000 mg | ORAL_CAPSULE | Freq: Two times a day (BID) | ORAL | Status: DC
  Administered 2017-08-24 – 2017-08-26 (×4): 100 mg via ORAL
  Filled 2017-08-24 (×4): qty 1

## 2017-08-24 MED ORDER — PANTOPRAZOLE SODIUM 40 MG PO TBEC
40.0000 mg | DELAYED_RELEASE_TABLET | Freq: Every day | ORAL | Status: DC
  Administered 2017-08-25 – 2017-08-26 (×2): 40 mg via ORAL
  Filled 2017-08-24 (×2): qty 1

## 2017-08-24 NOTE — ED Provider Notes (Signed)
[facility] EMERGENCY DEPARTMENT Provider Note   CSN: [id number] Arrival date & time: 08/24/17  1243     History   Chief Complaint Chief Complaint  Patient presents with  . Loss of Consciousness    HPI [name] is a 76 y.o. female.  Patient brought in by EMS.  Patient's son called for EMS to pick her up.  Patient has syncopal episode while at home.  Patient stated that she got up was walking and passed out next to the bed.  Patient was awake and responding when EMS arrived.  Patient states she did not trip or fall.  Does not recall going down.  Patient states she has been feeling weak today.  But otherwise no specific complaints.  Patient did have breakfast at about 8 this morning.  EMS noted blood sugar was 222 and blood pressure was 122/60.  Patient denies fevers denies any pain.  Did vomited once upon arrival.  Patient denies any history of passing out recently.  Patient denies any weakness any speech problems headache.      Past Medical History:  Diagnosis Date  . Anemia   . Aortic insufficiency    Moderate  . Back pain   . Carotid stenosis, bilateral   . Cirrhosis ([place])   . Closed left femoral fracture ([place]) 04/21/2017  . Coronary artery disease    Stent x 2 RCA 1995, cardiac catheterization 09/2016 showing only mild atherosclerosis  . DDD (degenerative disc disease), lumbar   . Essential hypertension   . Falls   . GERD (gastroesophageal reflux disease)   . Hiatal hernia   . History of stroke   . Hypercholesteremia   . IBS (irritable bowel syndrome)   . Myocardial infarction ([place]) 1995  . Non-alcoholic fatty liver disease   . OSA (obstructive sleep apnea)    Sleep study 2009  AHI 9.91/hr and during REM sleep 35.58/hr  . Pericardial effusion    a. small by echo 2018.  Marland Kitchen PSVT (paroxysmal supraventricular tachycardia) ([place])   . Recurrent UTI   . Seizures ([place])   . Stroke ([place])   . Type 2 diabetes mellitus ([place])   . Varices, esophageal [facility])     Patient  Active Problem List   Diagnosis Date Noted  . Closed left femoral fracture ([place]) 04/21/2017  . Seizure disorder ([place]) 04/21/2017  . Closed pertrochanteric fracture of femur, left, initial encounter ([place]) 03/02/2017  . Seizure ([place]) 02/28/2017  . Hip fracture ([place]) 02/28/2017  . Chest pain 12/07/2016  . Atypical chest pain 12/07/2016  . Tachycardia   . NSTEMI (non-ST elevated myocardial infarction) ([place]) 11/02/2016  . Multifocal atrial tachycardia ([place]) 09/25/2016  . Elevated troponin 09/24/2016  . Hypokalemia 09/23/2016  . TIA (transient ischemic attack) 05/05/2016  . Intractable nausea and vomiting 10/15/2015  . Acute coronary syndrome ([place]) 06/01/2015  . Bronchitis 06/01/2015  . Thrombocytopenia ([place]) 06/01/2015  . Cerebral infarction ([place]) 03/06/2014  . Lower urinary tract infectious disease   . PSVT (paroxysmal supraventricular tachycardia) ([place]) 12/09/2013  . Esophageal varices ([place]) 03/29/2013  . Hematemesis 03/29/2013  . Hepatic cirrhosis ([place]) 03/29/2013  . Presence of stent in right coronary artery 07/04/2011  . OSA (obstructive sleep apnea) 07/04/2011  . Aortic sclerosis 07/04/2011  . Aortic insufficiency 07/04/2011  . HTN (hypertension) 07/04/2011  . Carotid stenosis, bilateral 07/04/2011  . Chest pain at rest 07/03/2011  . CAD (coronary artery disease) 07/03/2011  . DM type 2 (diabetes mellitus, type 2) ([place]) 07/03/2011  . Hypercholesteremia 07/03/2011  .  GERD (gastroesophageal reflux disease) 07/03/2011    Past Surgical History:  Procedure Laterality Date  . [place]  . APPENDECTOMY    . BACK SURGERY  1985  . CARDIAC CATHETERIZATION  [phone]   Stent to the proximal RCA after MI   . CARDIAC CATHETERIZATION N/A 09/26/2016   Procedure: Left Heart Cath and Coronary Angiography;  Surgeon: Leonie Man, MD;  Location: [place] [facility];  Service: Cardiovascular;  Laterality: N/A;  . CATARACT EXTRACTION W/PHACO Right 07/04/2014    Procedure: CATARACT EXTRACTION PHACO AND INTRAOCULAR LENS PLACEMENT (IOC);  Surgeon: Elta Guadeloupe T. Gershon Crane, MD;  Location: [facility];  Service: Ophthalmology;  Laterality: Right;  CDE:13.13  . COLONOSCOPY    . DILATION AND CURETTAGE OF UTERUS     x2  . Epi Retinal Membrane Peel Left   . ERCP    . ESOPHAGEAL BANDING N/A 04/01/2013   Procedure: ESOPHAGEAL BANDING;  Surgeon: Rogene Houston, MD;  Location: [facility];  Service: Endoscopy;  Laterality: N/A;  . ESOPHAGEAL BANDING N/A 05/24/2013   Procedure: ESOPHAGEAL BANDING;  Surgeon: Rogene Houston, MD;  [facility];  Service: Endoscopy;  Laterality: N/A;  . ESOPHAGEAL BANDING N/A 06/21/2014   Procedure: ESOPHAGEAL BANDING;  Surgeon: Rogene Houston, MD;  [facility];  Service: Endoscopy;  Laterality: N/A;  . ESOPHAGOGASTRODUODENOSCOPY N/A 04/01/2013   Procedure: ESOPHAGOGASTRODUODENOSCOPY (EGD);  Surgeon: Rogene Houston, MD;  [facility];  Service: Endoscopy;  Laterality: N/A;  230-rescheduled to 8:30am Ann notified pt  . ESOPHAGOGASTRODUODENOSCOPY N/A 05/24/2013   Procedure: ESOPHAGOGASTRODUODENOSCOPY (EGD);  Surgeon: Rogene Houston, MD;  [facility];  Service: Endoscopy;  Laterality: N/A;  730  . ESOPHAGOGASTRODUODENOSCOPY N/A 06/21/2014   Procedure: ESOPHAGOGASTRODUODENOSCOPY (EGD);  Surgeon: Rogene Houston, MD;  [facility];  Service: Endoscopy;  Laterality: N/A;  930-rescheduled 10/14 @ 1200 Ann to notify pt  . EYE SURGERY  08   cataract surgery of the left eye  . FEMUR IM NAIL Left 03/02/2017   Procedure: INTRAMEDULLARY (IM) NAIL FEMORAL;  Surgeon: Rod Can, MD;  Location: [place];  Service: Orthopedics;  Laterality: Left;  . HARDWARE REMOVAL Right 01/17/2013   Procedure: REMOVAL OF HARDWARE AND EXCISION ULNAR STYLOID RIGHT WRIST;  Surgeon: Tennis Must, MD;  Location: [place];  Service: Orthopedics;  Laterality: Right;  . MYRINGOTOMY  2012   both ears  . ORIF  FEMUR FRACTURE Left 04/23/2017   Procedure: OPEN REDUCTION INTERNAL FIXATION (ORIF) DISTAL FEMUR FRACTURE (FRACTURE AROUND FEMORAL NAIL);  Surgeon: Altamese Somonauk, MD;  Location: [place];  Service: Orthopedics;  Laterality: Left;  . TONSILLECTOMY    . VAGINAL HYSTERECTOMY  1972  . WRIST SURGERY     rt wrist hardwear removal    OB History    No data available       Home Medications    Prior to Admission medications   Medication Sig Start Date End Date Taking? Authorizing Provider  aspirin EC 81 MG tablet Take 81 mg by mouth daily.   Yes Jake Samples, PA-C  atorvastatin (LIPITOR) 40 MG tablet Take 40 mg by mouth daily.   Yes Jake Samples, PA-C  carvedilol (COREG) 12.5 MG tablet Take 12.5 mg by mouth 2 (two) times daily with a meal.    Yes [provider]  diltiazem (CARDIZEM CD) 120 MG 24 hr capsule Take 120 mg by mouth daily.   Yes Glennon Mac,  Samantha J, PA-C  ibuprofen (ADVIL) 200 MG tablet Take 200 mg by mouth every 6 (six) hours as needed for headache or moderate pain.    Yes [provider]  isosorbide mononitrate (IMDUR) 30 MG 24 hr tablet Take 30 mg by mouth daily.    Yes Jake Samples, PA-C  lisinopril (PRINIVIL,ZESTRIL) 20 MG tablet Take 20 mg by mouth daily.   Yes Delman Cheadle J, PA-C  ondansetron (ZOFRAN) 4 MG tablet Take 4 mg by mouth every 8 (eight) hours as needed for nausea or vomiting.   Yes [provider]  pantoprazole (PROTONIX) 40 MG tablet Take 40 mg by mouth daily.   Yes [provider]  phenytoin (DILANTIN) 100 MG ER capsule Take 100 mg by mouth 2 (two) times daily.   Yes [provider]  potassium chloride SA (K-DUR,KLOR-CON) 20 MEQ tablet Take 1 tablet (20 mEq total) by mouth daily. 12/09/16  Yes Lendon Colonel, NP  zolpidem (AMBIEN) 10 MG tablet take 1 tablet by mouth at bedtime if needed 05/01/17  Yes [provider]  ferrous sulfate 325 (65 FE) MG tablet Take 1 tablet (325 mg total) by  mouth 3 (three) times daily after meals. Patient not taking: Reported on 08/24/2017 03/05/17   Bonnielee Haff, MD  folic acid (FOLVITE) 1 MG tablet Take 1 tablet (1 mg total) by mouth daily. Patient not taking: Reported on 08/24/2017 03/06/17   Bonnielee Haff, MD  vitamin C (ASCORBIC ACID) 500 MG tablet Take 500 mg by mouth 3 (three) times daily.    [provider]    Family History Family History  Problem Relation Age of Onset  . Diabetes Mother   . Heart failure Father   . Heart failure Maternal Aunt     Social History Social History   Tobacco Use  . Smoking status: Current Some Day Smoker    Packs/day: 0.25    Years: 50.00    Pack years: 12.50    Types: Cigarettes    Last attempt to quit: 01/13/2012    Years since quitting: 5.6  . Smokeless tobacco: Never Used  Substance Use Topics  . Alcohol use: No    Alcohol/week: 0.0 oz  . Drug use: No     Allergies   Tape   Review of Systems Review of Systems  Constitutional: Negative for fever.  HENT: Negative for congestion.   Eyes: Negative for pain and redness.  Respiratory: Negative for shortness of breath.   Cardiovascular: Negative for chest pain.  Gastrointestinal: Positive for vomiting. Negative for abdominal pain.  Genitourinary: Negative for dysuria.  Musculoskeletal: Negative for back pain and neck pain.  Neurological: Positive for syncope and weakness. Negative for dizziness, facial asymmetry, speech difficulty and headaches.  Hematological: Does not bruise/bleed easily.  Psychiatric/Behavioral: Negative for confusion.     Physical Exam Updated Vital Signs BP (!) 119/44   Pulse (!) 48   Temp 98 F (36.7 C)   Resp 16   Ht 1.6 m (5' 3" )   Wt 61.2 kg (135 lb)   SpO2 94%   BMI 23.91 kg/m   Physical Exam  Constitutional: She is oriented to person, place, and time. She appears well-developed and well-nourished. No distress.  HENT:  Head: Normocephalic and atraumatic.  His membranes slightly  dry  Eyes: Conjunctivae and EOM are normal. Pupils are equal, round, and reactive to light.  Neck: Normal range of motion. Neck supple.  Cardiovascular: Normal rate, regular rhythm and normal heart sounds.  Pulmonary/Chest: Effort normal and breath sounds normal. No respiratory distress.  Abdominal: Soft. Bowel sounds are normal. There is no tenderness.  Musculoskeletal: Normal range of motion.  Patient with pre-existing deformity to left leg also has foot drop wears a brace.  Neurological: She is alert and oriented to person, place, and time. No cranial nerve deficit or sensory deficit. She exhibits normal muscle tone. Coordination normal.  Skin: Skin is warm.  Nursing note and vitals reviewed.    ED Treatments / Results  Labs (all labs ordered are listed, but only abnormal results are displayed) Labs Reviewed  CBC - Abnormal; Notable for the following components:      Result Value   RBC 3.72 (*)    Hemoglobin 11.8 (*)    All other components within normal limits  URINALYSIS, ROUTINE W REFLEX MICROSCOPIC - Abnormal; Notable for the following components:   Bacteria, UA RARE (*)    Squamous Epithelial / LPF 0-5 (*)    All other components within normal limits  COMPREHENSIVE METABOLIC PANEL - Abnormal; Notable for the following components:   Glucose, Bld 147 (*)    Calcium 8.4 (*)    Total Protein 5.8 (*)    Albumin 3.2 (*)    Alkaline Phosphatase 153 (*)    All other components within normal limits  CBG MONITORING, ED - Abnormal; Notable for the following components:   Glucose-Capillary 121 (*)    All other components within normal limits  LIPASE, BLOOD    EKG  EKG Interpretation  Date/Time:  Monday August 24 2017 12:50:55 EST Ventricular Rate:  52 PR Interval:    QRS Duration: 98 QT Interval:  493 QTC Calculation: 459 R Axis:   90 Text Interpretation:  Sinus rhythm Prolonged PR interval Borderline right axis deviation Anteroseptal infarct, old No significant change  since last tracing Confirmed by Fredia Sorrow [phone]) on 08/24/2017 1:41:27 PM       Radiology No results found.  Procedures Procedures (including critical care time)  Medications Ordered in ED Medications  0.9 %  sodium chloride infusion ( Intravenous New Bag/Given 08/24/17 1347)  ondansetron (ZOFRAN) injection 4 mg (4 mg Intravenous Given 08/24/17 1322)  sodium chloride 0.9 % bolus 500 mL (500 mLs Intravenous New Bag/Given 08/24/17 1347)     Initial Impression / Assessment and Plan / ED Course  I have reviewed the triage vital signs and the nursing notes.  Pertinent labs & imaging results that were available during my care of the patient were reviewed by me and considered in my medical decision making (see chart for details).    Patient clearly requires admission for the syncopal episode.  CT head is still pending.  Rest of workup without any significant findings other than elevated alkaline phosphatase.  Which has been elevated in the past.  Patient otherwise stable here no evidence of any hip fractures or lower extremity injuries.  Chest x-ray negative.  Urinalysis negative for urinary tract infection.   Final Clinical Impressions(s) / ED Diagnoses   Final diagnoses:  Syncope and collapse    ED Discharge Orders    None       Fredia Sorrow, MD 08/24/17 1520

## 2017-08-24 NOTE — ED Notes (Signed)
Report given to brittany,RN at this time.

## 2017-08-24 NOTE — H&P (Addendum)
History and Physical    [name] JOI:[id number] DOB: [birth year] DOA: 08/24/2017  PCP: Redmond School, MD Consultants:  [facility] - cardiology; [facility] - GI Patient coming from:  Home - lives alone; [place]: Son, [phone]  Chief Complaint: syncope  HPI: [name] is a 76 y.o. female with medical history significant of DM, CVA, seizures, OSA, CAD, HLD, HTN, B carotid stenosis and moderate aortic insufficiency presenting with syncope.  Patient was fine this AM but stood up and fainted, "just dropped."  This happened immediately after she stood up.  She was out for just a few seconds.  She was not confused afterwards.  No bowel/bladder incontinence.  She has never had this happen before.  She does feel dizzy when she sits up but is otherwise back to baseline.  No CP, SOB.  She has been eating/drinking well the last few days and does not feel dehydrated.  Echo in 6/18 with stable, normal EF; mild to moderate AS with mild to moderate AI; heavy MAC with mild MS; and severe LA dilation.  ED Course: AP chronically elevated but labs otherwise unremarkable.  Recommends observation with cardiac monitoring.  Review of Systems: As per HPI; otherwise review of systems reviewed and negative.   Ambulatory Status:  Ambulates without assistance  Past Medical History:  Diagnosis Date  . Anemia   . Aortic insufficiency    Moderate  . Back pain   . Carotid stenosis, bilateral   . Cirrhosis ([place])   . Closed left femoral fracture ([place]) 04/21/2017  . Coronary artery disease    Stent x 2 RCA 1995, cardiac catheterization 09/2016 showing only mild atherosclerosis  . DDD (degenerative disc disease), lumbar   . Essential hypertension   . Falls   . GERD (gastroesophageal reflux disease)   . Hiatal hernia   . History of stroke   . Hypercholesteremia   . IBS (irritable bowel syndrome)   . Non-alcoholic fatty liver disease   . OSA (obstructive sleep apnea)    no CPAP  . Pericardial effusion    a.  small by echo 2018.  Marland Kitchen PSVT (paroxysmal supraventricular tachycardia) ([place])   . Recurrent UTI   . Seizures ([place])   . Stroke ([place])   . Type 2 diabetes mellitus ([place])    off medication  . Varices, esophageal ([place])     Past Surgical History:  Procedure Laterality Date  . [place]  . APPENDECTOMY    . BACK SURGERY  1985  . CARDIAC CATHETERIZATION  [phone]   Stent to the proximal RCA after MI   . CARDIAC CATHETERIZATION N/A 09/26/2016   Procedure: Left Heart Cath and Coronary Angiography;  Surgeon: Leonie Man, MD;  Location: [facility];  Service: Cardiovascular;  Laterality: N/A;  . CATARACT EXTRACTION W/PHACO Right 07/04/2014   Procedure: CATARACT EXTRACTION PHACO AND INTRAOCULAR LENS PLACEMENT (IOC);  Surgeon: Elta Guadeloupe T. Gershon Crane, MD;  Location: [facility];  Service: Ophthalmology;  Laterality: Right;  CDE:13.13  . COLONOSCOPY    . DILATION AND CURETTAGE OF UTERUS     x2  . Epi Retinal Membrane Peel Left   . ERCP    . ESOPHAGEAL BANDING N/A 04/01/2013   Procedure: ESOPHAGEAL BANDING;  Surgeon: Rogene Houston, MD;  Location: [facility];  Service: Endoscopy;  Laterality: N/A;  . ESOPHAGEAL BANDING N/A 05/24/2013   Procedure: ESOPHAGEAL BANDING;  Surgeon: Rogene Houston, MD;  Location: [facility];  Service: Endoscopy;  Laterality: N/A;  .  ESOPHAGEAL BANDING N/A 06/21/2014   Procedure: ESOPHAGEAL BANDING;  Surgeon: Rogene Houston, MD;  Location: [facility];  Service: Endoscopy;  Laterality: N/A;  . ESOPHAGOGASTRODUODENOSCOPY N/A 04/01/2013   Procedure: ESOPHAGOGASTRODUODENOSCOPY (EGD);  Surgeon: Rogene Houston, MD;  Location[facility];  Service: Endoscopy;  Laterality: N/A;  230-rescheduled to 8:30am Ann notified pt  . ESOPHAGOGASTRODUODENOSCOPY N/A 05/24/2013   Procedure: ESOPHAGOGASTRODUODENOSCOPY (EGD);  Surgeon: Rogene Houston, MD;  Location[facility];  Service: Endoscopy;  Laterality: N/A;  730  . ESOPHAGOGASTRODUODENOSCOPY N/A  06/21/2014   Procedure: ESOPHAGOGASTRODUODENOSCOPY (EGD);  Surgeon: Rogene Houston, MD;  Location: [facility];  Service: Endoscopy;  Laterality: N/A;  930-rescheduled 10/14 @ 1200 Ann to notify pt  . EYE SURGERY  08   cataract surgery of the left eye  . FEMUR IM NAIL Left 03/02/2017   Procedure: INTRAMEDULLARY (IM) NAIL FEMORAL;  Surgeon: Rod Can, MD;  Location: [place];  Service: Orthopedics;  Laterality: Left;  . HARDWARE REMOVAL Right 01/17/2013   Procedure: REMOVAL OF HARDWARE AND EXCISION ULNAR STYLOID RIGHT WRIST;  Surgeon: Tennis Must, MD;  Location: [place];  Service: Orthopedics;  Laterality: Right;  . MYRINGOTOMY  2012   both ears  . ORIF FEMUR FRACTURE Left 04/23/2017   Procedure: OPEN REDUCTION INTERNAL FIXATION (ORIF) DISTAL FEMUR FRACTURE (FRACTURE AROUND FEMORAL NAIL);  Surgeon: Altamese Springview, MD;  Location: [place];  Service: Orthopedics;  Laterality: Left;  . TONSILLECTOMY    . VAGINAL HYSTERECTOMY  1972  . WRIST SURGERY     rt wrist hardwear removal    Social History   Socioeconomic History  . Marital status: Widowed    Spouse name: Not on file  . Number of children: 2  . Years of education: Not on file  . Highest education level: Not on file  Social Needs  . Financial resource strain: Not on file  . Food insecurity - worry: Not on file  . Food insecurity - inability: Not on file  . Transportation needs - medical: Not on file  . Transportation needs - non-medical: Not on file  Occupational History    Employer: RETIRED  Tobacco Use  . Smoking status: Current Some Day Smoker    Packs/day: 0.25    Years: 50.00    Pack years: 12.50    Types: Cigarettes    Last attempt to quit: 01/13/2012    Years since quitting: 5.6  . Smokeless tobacco: Never Used  Substance and Sexual Activity  . Alcohol use: No    Alcohol/week: 0.0 oz  . Drug use: No  . Sexual activity: Yes    Birth control/protection: Surgical  Other Topics Concern  . Not on  file  Social History Narrative  . Not on file    Allergies  Allergen Reactions  . Tape Rash and Other (See Comments)    Please use paper tape    Family History  Problem Relation Age of Onset  . Diabetes Mother   . Heart failure Father   . Heart failure Maternal Aunt     Prior to Admission medications   Medication Sig Start Date End Date Taking? Authorizing Provider  aspirin EC 81 MG tablet Take 81 mg by mouth daily.   Yes Jake Samples, PA-C  atorvastatin (LIPITOR) 40 MG tablet Take 40 mg by mouth daily.   Yes Jake Samples, PA-C  carvedilol (COREG) 12.5 MG tablet Take 12.5 mg by mouth 2 (two) times daily with  a meal.    Yes [provider]  diltiazem (CARDIZEM CD) 120 MG 24 hr capsule Take 120 mg by mouth daily.   Yes Delman Cheadle J, PA-C  ibuprofen (ADVIL) 200 MG tablet Take 200 mg by mouth every 6 (six) hours as needed for headache or moderate pain.    Yes [provider]  isosorbide mononitrate (IMDUR) 30 MG 24 hr tablet Take 30 mg by mouth daily.    Yes Jake Samples, PA-C  lisinopril (PRINIVIL,ZESTRIL) 20 MG tablet Take 20 mg by mouth daily.   Yes Delman Cheadle J, PA-C  ondansetron (ZOFRAN) 4 MG tablet Take 4 mg by mouth every 8 (eight) hours as needed for nausea or vomiting.   Yes [provider]  pantoprazole (PROTONIX) 40 MG tablet Take 40 mg by mouth daily.   Yes [provider]  phenytoin (DILANTIN) 100 MG ER capsule Take 100 mg by mouth 2 (two) times daily.   Yes [provider]  potassium chloride SA (K-DUR,KLOR-CON) 20 MEQ tablet Take 1 tablet (20 mEq total) by mouth daily. 12/09/16  Yes Lendon Colonel, NP  zolpidem (AMBIEN) 10 MG tablet take 1 tablet by mouth at bedtime if needed 05/01/17  Yes [provider]  ferrous sulfate 325 (65 FE) MG tablet Take 1 tablet (325 mg total) by mouth 3 (three) times daily after meals. Patient not taking: Reported on 08/24/2017 03/05/17   Bonnielee Haff,  MD  folic acid (FOLVITE) 1 MG tablet Take 1 tablet (1 mg total) by mouth daily. Patient not taking: Reported on 08/24/2017 03/06/17   Bonnielee Haff, MD  vitamin C (ASCORBIC ACID) 500 MG tablet Take 500 mg by mouth 3 (three) times daily.    [provider]    Physical Exam: Vitals:   08/24/17 1630 08/24/17 1700 08/24/17 1809 08/24/17 1838  BP: (!) 129/40 (!) 116/35 (!) 149/47   Pulse: (!) 51 (!) 54 (!) 54   Resp: 17 13 15 16   Temp:    98.2 F (36.8 C)  TempSrc:    Oral  SpO2: 97% 94% 94% 95%  Weight:      Height:         General:  Appears calm and comfortable and is NAD Eyes:  PERRL, EOMI, normal lids, iris ENT:  grossly normal hearing, lips & tongue, mmm Neck:  no LAD, masses or thyromegaly; no carotid bruits Cardiovascular:  RRR, 8-6/5 systolic murmur, no r/g. No LE edema.  Respiratory:   CTA bilaterally with no wheezes/rales/rhonchi.  Normal respiratory effort. Abdomen:  soft, NT, ND, NABS Skin:  no rash or induration seen on limited exam Musculoskeletal:  grossly normal tone BUE/BLE, good ROM, no bony abnormality Psychiatric:  blunted mood and affect, speech fluent and appropriate, AOx3 Neurologic:  CN 2-12 grossly intact, moves all extremities in coordinated fashion, sensation intact    Radiological Exams on Admission: Dg Chest 2 View  Result Date: 08/24/2017 CLINICAL DATA:  Syncopal episode today. EXAM: CHEST  2 VIEW COMPARISON:  04/21/2017 and 02/26/2017 FINDINGS: Minimal blunting at the left costophrenic angle may be new. Otherwise, the lungs are clear. No pulmonary edema. Heart and mediastinum are within normal limits. Bridging osteophytes in the thoracic spine. IMPRESSION: Minimal blunting at the left costophrenic angle. Findings could represent mild atelectasis or tiny left pleural effusion. Otherwise, normal chest examination. Electronically Signed   By: Markus Daft M.D.   On: 08/24/2017 15:07   Ct Head Wo Contrast  Result Date: 08/24/2017 CLINICAL  DATA:  Altered level of consciousness. EXAM: CT HEAD WITHOUT CONTRAST TECHNIQUE: Contiguous axial images were obtained from the base of the skull through the vertex without intravenous contrast. COMPARISON:  CT scan of February 28, 2017. FINDINGS: Brain: Mild diffuse cortical atrophy is noted. Old right frontal infarction is noted. No mass effect or midline shift is noted. Ventricular size is within normal limits. There is no evidence of mass lesion, hemorrhage or acute infarction. Vascular: No hyperdense vessel or unexpected calcification. Skull: Normal. Negative for fracture or focal lesion. Sinuses/Orbits: No acute finding. Other: None. IMPRESSION: Mild diffuse cortical atrophy. Old right frontal infarction. No acute intracranial abnormality seen. Electronically Signed   By: Marijo Conception, M.D.   On: 08/24/2017 15:34    EKG: Independently reviewed.  NSR with rate 52; nonspecific ST changes with no evidence of acute ischemia; NSCSLT   Labs on Admission: I have personally reviewed the available labs and imaging studies at the time of the admission.  Pertinent labs:   Glucose 147 AP 153; 226 on 8/15 Albumin 3.2; 2.4 on 8/15 Hgb 11.8; 7.5 on 8/18 UA unremarkable  Assessment/Plan Principal Problem:   Syncope Active Problems:   DM type 2 (diabetes mellitus, type 2) (HCC)   Hypercholesteremia   HTN (hypertension)   Seizure disorder (HCC)   Syncope -Etiology is most likely vasovagal vs. orthostatic syncope - she stood up and immediately lost consciousness and then returned to baseline within seconds other than mild dizziness persisting. -By the [facility] syncope rule, the patient is at low for serious outcome -Will monitor on telemetry -Orthostatic vital signs now and in AM -Trending troponins x3  -Will repeat 2d echo in AM since her last was abnormal in 6/18 -Neuro checks  -check A1c, TSH -continue ASA -Anticipate d/c to home tomorrow after Echo if it is unchanged and there are no  new issues  DM -Will check A1c -She is not currently on medications  HTN -Continue Cardizem -Hold Coreg and Lisinopril until tomorrow AM due to mildly low BP in ER and possible orthostasis  HLD -Lipids in 6/18: 98/39/43/78 -Continue Lipitor 40 mg daily  Seizure d/o -Check Dilantin level -Continue Dilantin at current dose for now -Today's episode does not sound seizure-like by history but this is another possibility   DVT prophylaxis: Lovenox Code Status: DNR - confirmed with patient Family Communication: None present Disposition Plan:  Home once clinically improved Consults called: None  Admission status: It is my clinical opinion that referral for OBSERVATION is reasonable and necessary in this patient based on the above information provided. The aforementioned taken together are felt to place the patient at high risk for further clinical deterioration. However it is anticipated that the patient may be medically stable for discharge from the hospital within 24 to 48 hours.    Karmen Bongo MD [facility]  If note is complete, please contact covering daytime or nighttime physician. [email or web address] Password [email or web address]  08/24/2017, 7:12 PM

## 2017-08-24 NOTE — ED Triage Notes (Addendum)
PT brought in by EMS due to syncopal episode while at home. Pt reports she got up to call son and passed out next to bed. Pt was awake and responding when EMS arrived. Pt denies pain, but reports feeling weak. Pt reports eating breakfast approx 8 am. CBG 222 BP 122/60 at site

## 2017-08-24 NOTE — Progress Notes (Signed)
Pt unable to do the standing portion of orthostatic vitals. Stated she felt too weak. Lying and sitting performed. Other than feeling weak, pt is in no distress.

## 2017-08-25 ENCOUNTER — Observation Stay (HOSPITAL_BASED_OUTPATIENT_CLINIC_OR_DEPARTMENT_OTHER): Payer: Medicare Other

## 2017-08-25 ENCOUNTER — Observation Stay (HOSPITAL_COMMUNITY)
Admit: 2017-08-25 | Discharge: 2017-08-25 | Disposition: A | Discharge status: Home / Self Care | Payer: Medicare Other | Attending: Internal Medicine | Admitting: Internal Medicine

## 2017-08-25 DIAGNOSIS — I361 Nonrheumatic tricuspid (valve) insufficiency: Secondary | ICD-10-CM | POA: Diagnosis not present

## 2017-08-25 DIAGNOSIS — G40909 Epilepsy, unspecified, not intractable, without status epilepticus: Secondary | ICD-10-CM | POA: Diagnosis not present

## 2017-08-25 DIAGNOSIS — D696 Thrombocytopenia, unspecified: Secondary | ICD-10-CM

## 2017-08-25 DIAGNOSIS — R55 Syncope and collapse: Secondary | ICD-10-CM | POA: Diagnosis not present

## 2017-08-25 DIAGNOSIS — I471 Supraventricular tachycardia: Secondary | ICD-10-CM

## 2017-08-25 DIAGNOSIS — R569 Unspecified convulsions: Secondary | ICD-10-CM | POA: Diagnosis not present

## 2017-08-25 DIAGNOSIS — K7460 Unspecified cirrhosis of liver: Secondary | ICD-10-CM

## 2017-08-25 DIAGNOSIS — I10 Essential (primary) hypertension: Secondary | ICD-10-CM | POA: Diagnosis not present

## 2017-08-25 LAB — BASIC METABOLIC PANEL
ANION GAP: 9 (ref 5–15)
BUN: 11 mg/dL (ref 6–20)
CHLORIDE: 107 mmol/L (ref 101–111)
CO2: 25 mmol/L (ref 22–32)
Calcium: 8.4 mg/dL — ABNORMAL LOW (ref 8.9–10.3)
Creatinine, Ser: 0.61 mg/dL (ref 0.44–1.00)
GFR calc Af Amer: >60 mL/min (ref >60)
GLUCOSE: 88 mg/dL (ref 65–99)
POTASSIUM: 3.8 mmol/L (ref 3.5–5.1)
Sodium: 141 mmol/L (ref 135–145)

## 2017-08-25 LAB — ECHOCARDIOGRAM COMPLETE
Height: 63 in
Weight: 2097.6 oz

## 2017-08-25 LAB — CBC
HEMATOCRIT: 34.6 % — AB (ref 36.0–46.0)
HEMOGLOBIN: 11.2 g/dL — AB (ref 12.0–15.0)
MCH: 31.6 pg (ref 26.0–34.0)
MCHC: 32.4 g/dL (ref 30.0–36.0)
MCV: 97.7 fL (ref 78.0–100.0)
Platelets: 127 10*3/uL — ABNORMAL LOW (ref 150–400)
RBC: 3.54 MIL/uL — ABNORMAL LOW (ref 3.87–5.11)
RDW: 12.8 % (ref 11.5–15.5)
WBC: 5.1 10*3/uL (ref 4.0–10.5)

## 2017-08-25 LAB — CK: CK TOTAL: 50 U/L (ref 38–234)

## 2017-08-25 LAB — HEMOGLOBIN A1C
Hgb A1c MFr Bld: 4.6 % — ABNORMAL LOW (ref 4.8–5.6)
MEAN PLASMA GLUCOSE: 85 mg/dL

## 2017-08-25 NOTE — Progress Notes (Signed)
EEG Completed; Results Pending  

## 2017-08-25 NOTE — Progress Notes (Signed)
EKG completed and placed on pt's chart

## 2017-08-25 NOTE — Progress Notes (Signed)
*  PRELIMINARY RESULTS* Echocardiogram 2D Echocardiogram has been performed.  Leavy Cella 08/25/2017, 2:37 PM

## 2017-08-25 NOTE — Care Management Obs Status (Signed)
[place] NOTIFICATION   Patient Details  Name: [name] MRN: [id number] Date of Birth: Feb 20, 1941   Medicare Observation Status Notification Given:  Yes    Sherald Barge, RN 08/25/2017, 10:45 AM

## 2017-08-25 NOTE — Progress Notes (Signed)
PROGRESS NOTE  [name] OYD:[id number] DOB: 1941-01-18 DOA: 08/24/2017 PCP: Redmond School, MD  Brief History:  76 year old female with a history of diabetes mellitus, seizure disorder, coronary artery disease with MI in 1995 and 2 RCA stents, moderate AI, NASH cirrhosis presented with a syncopal episode.  The patient states that she was standing up from her while watching television when she passed out.  The next thing she remembered was EMS helping her.  Apparently, the patient's son found the patient on the floor.  She denied any bowel or bladder incontinence.  She denies biting her tongue.  She felt like her speech was slurred when she woke up, but stated that it improved by the time she went to the emergency department.  Patient denied any prodromal symptoms including but not limited to dizziness, chest discomfort, shortness of breath, visual disturbance.  In the emergency department, the patient was afebrile hemodynamically stable.  CT of the brain was unremarkable.  Assessment/Plan: Syncope -etiology unclear, but suspect vasovagal component -remain on telemetry -cardiology consult -Echo -EEG -orthostatics  Dilantin toxicity -dilantin level 39.1 at presentation -there is discrepancy between reported and documented dosages -I have asked son to bring in bottle  PSVT -presently in sinus -cardiology consult  CAD - history of MI in 1995, received 2 stents to RCA - cath 2007 with patent coronaries.  - cath Jan 2018 nonobstructive disease -no chest pain -personally reviewed EKG--sinus, nonspecific T wave changes  Seizure disorder -EEG -admitted 6/23 to 6/28 with seizure -monitor dilantin level  Essential Hypertension -continue home dose diltiazem, coreg, lisinopril, imdur  Diabetes mellitus type 2  -Check hemoglobin A1c  Recent left femoral peri-implant fracture -April 21, 2017 ORIF -PT evaluation  Thrombocytopenia -chronic -monitor cbc -secondary  to NASH cirrhosis     Disposition Plan:   Home 12/19 if stable Family Communication:  No Family at bedside--Total time spent 35 minutes.  Greater than 50% spent face to face counseling and coordinating care.   Consultants:  cardiology  Code Status:  FULL  DVT Prophylaxis:  [place] Lovenox   Procedures: As Listed in Progress Note Above  Antibiotics: None    Subjective: Patient denies fevers, chills, headache, chest pain, dyspnea, nausea, vomiting, diarrhea, abdominal pain, dysuria, hematuria, hematochezia, and melena.   Objective: Vitals:   08/24/17 2059 08/24/17 2110 08/24/17 2233 08/25/17 0536  BP:  (!) 156/43 (!) 124/41 (!) 147/41  Pulse:  (!) 58 (!) 56 (!) 59  Resp:  18 16 16   Temp:  98.3 F (36.8 C) 98.1 F (36.7 C) 98.4 F (36.9 C)  TempSrc:  Oral Oral Oral  SpO2: 96% 96% 96% 95%  Weight:   59.5 kg (131 lb 1.6 oz)   Height:        Intake/Output Summary (Last 24 hours) at 08/25/2017 0803 Last data filed at 08/25/2017 0500 Gross per 24 hour  Intake 1640 ml  Output 900 ml  Net 740 ml   Weight change:  Exam:   General:  Pt is alert, follows commands appropriately, not in acute distress  HEENT: No icterus, No thrush, No neck mass, [employer]/AT  Cardiovascular: RRR, S1/S2, no rubs, no gallops  Respiratory: CTA bilaterally, no wheezing, no crackles, no rhonchi  Abdomen: Soft/+BS, non tender, non distended, no guarding  Extremities: trace LE edema, No lymphangitis, No petechiae, No rashes, no synovitis   Data Reviewed: I have personally reviewed following labs and imaging studies Basic Metabolic Panel: Recent Labs  Lab 08/24/17 1339 08/25/17 0441  NA 136 141  K 4.0 3.8  CL 104 107  CO2 26 25  GLUCOSE 147* 88  BUN 14 11  CREATININE 0.66 0.61  CALCIUM 8.4* 8.4*   Liver Function Tests: Recent Labs  Lab 08/24/17 1339  AST 29  ALT 21  ALKPHOS 153*  BILITOT 0.7  PROT 5.8*  ALBUMIN 3.2*   Recent Labs  Lab 08/24/17 1339  LIPASE 24   No  results for input(s): AMMONIA in the last 168 hours. Coagulation Profile: No results for input(s): INR, PROTIME in the last 168 hours. CBC: Recent Labs  Lab 08/24/17 1328 08/25/17 0441  WBC 4.9 5.1  HGB 11.8* 11.2*  HCT 36.0 34.6*  MCV 96.8 97.7  PLT 167 127*   Cardiac Enzymes: No results for input(s): CKTOTAL, CKMB, CKMBINDEX, TROPONINI in the last 168 hours. BNP: Invalid input(s): POCBNP CBG: Recent Labs  Lab 08/24/17 1332  GLUCAP 121*   HbA1C: Recent Labs    08/24/17 1820  HGBA1C 4.6*   Urine analysis:    Component Value Date/Time   COLORURINE YELLOW 08/24/2017 [place] 08/24/2017 1328   LABSPEC 1.010 08/24/2017 1328   PHURINE 5.0 08/24/2017 1328   GLUCOSEU NEGATIVE 08/24/2017 1328   HGBUR NEGATIVE 08/24/2017 [place] 08/24/2017 [place] 08/24/2017 1328   PROTEINUR NEGATIVE 08/24/2017 1328   UROBILINOGEN 0.2 07/09/2015 0930   NITRITE NEGATIVE 08/24/2017 1328   LEUKOCYTESUR NEGATIVE 08/24/2017 1328   Sepsis Labs: @LABRCNTIP (procalcitonin:4,lacticidven:4) )No results found for this or any previous visit (from the past 240 hour(s)).   Scheduled Meds: . aspirin EC  81 mg Oral Daily  . atorvastatin  40 mg Oral q1800  . carvedilol  12.5 mg Oral BID WC  . diltiazem  120 mg Oral Daily  . docusate sodium  100 mg Oral BID  . enoxaparin (LOVENOX) injection  40 mg Subcutaneous Q24H  . isosorbide mononitrate  30 mg Oral Daily  . lisinopril  20 mg Oral Daily  . pantoprazole  40 mg Oral Daily  . phenytoin  100 mg Oral BID  . sodium chloride flush  3 mL Intravenous Q12H  . vitamin C  500 mg Oral TID  . zolpidem  5 mg Oral QHS   Continuous Infusions: . sodium chloride 75 mL/hr (08/25/17 0500)    Procedures/Studies: Dg Chest 2 View  Result Date: 08/24/2017 CLINICAL DATA:  Syncopal episode today. EXAM: CHEST  2 VIEW COMPARISON:  04/21/2017 and 02/26/2017 FINDINGS: Minimal blunting at the left costophrenic  angle may be new. Otherwise, the lungs are clear. No pulmonary edema. Heart and mediastinum are within normal limits. Bridging osteophytes in the thoracic spine. IMPRESSION: Minimal blunting at the left costophrenic angle. Findings could represent mild atelectasis or tiny left pleural effusion. Otherwise, normal chest examination. Electronically Signed   By: Markus Daft M.D.   On: 08/24/2017 15:07   Ct Head Wo Contrast  Result Date: 08/24/2017 CLINICAL DATA:  Altered level of consciousness. EXAM: CT HEAD WITHOUT CONTRAST TECHNIQUE: Contiguous axial images were obtained from the base of the skull through the vertex without intravenous contrast. COMPARISON:  CT scan of February 28, 2017. FINDINGS: Brain: Mild diffuse cortical atrophy is noted. Old right frontal infarction is noted. No mass effect or midline shift is noted. Ventricular size is within normal limits. There is no evidence of mass lesion, hemorrhage or acute infarction. Vascular: No hyperdense vessel or unexpected calcification. Skull: Normal. Negative for fracture  or focal lesion. Sinuses/Orbits: No acute finding. Other: None. IMPRESSION: Mild diffuse cortical atrophy. Old right frontal infarction. No acute intracranial abnormality seen. Electronically Signed   By: Marijo Conception, M.D.   On: 08/24/2017 15:34    Orson Eva, DO  [facility] Pager [phone]  If 7PM-7AM, please contact night-coverage [email or web address] Password [email or web address] 08/25/2017, 8:03 AM   LOS: 0 days

## 2017-08-25 NOTE — Consult Note (Signed)
   [facility] CM Inpatient Consult   08/25/2017  Dorean Hiebert 09/01/1941 [id number]   Patient is currently active with [facility] for chronic disease management services.  Patient has been engaged by a [employer]. Stopped by to see patient and let her know her community case manager had asked about her and sent her get well wishes. Patient grateful for visit and spoke fondly of community case manager stating " Maudie Mercury is such a Training and development officer."  Our community based plan of care has focused on disease management and community resource support.  Patient will receive a post discharge transition of care call and will be evaluated for monthly home visits for assessments and disease process education.  Made Inpatient Case Manager aware that [place] [facility] following. Of note, [facility] Care Management services does not replace or interfere with any services that are needed or arranged by inpatient case management or social work.  For additional questions or referrals please contact:  Semiah Konczal RN, [facility] Liaison  [phone]) [place] [phone]) Toll free office

## 2017-08-25 NOTE — Care Management Note (Signed)
Case Management Note  Patient Details  Name: [name] MRN: [id number] Date of Birth: 09-10-1940  Subjective/Objective:     Admitted after syncopal episode at home. Pt lives alone, has son who lives across the street. Pt is ind with ADL's, drives, has PCP and insurance with drug coverage.  Pt communicates no needs or concerns about DC.               Action/Plan: Anticipate pt will return home, ? Need for PT. Work up incomplete at this time. CM will cont to follow.   Expected Discharge Date:    08/26/2017              Expected Discharge Plan:  Home/Self Care  In-House Referral:  NA  Discharge planning Services  CM Consult  Status of Service:  In process, will continue to follow  Sherald Barge, RN 08/25/2017, 10:46 AM

## 2017-08-26 ENCOUNTER — Other Ambulatory Visit: Payer: Self-pay

## 2017-08-26 DIAGNOSIS — R55 Syncope and collapse: Secondary | ICD-10-CM

## 2017-08-26 DIAGNOSIS — I10 Essential (primary) hypertension: Secondary | ICD-10-CM | POA: Diagnosis not present

## 2017-08-26 DIAGNOSIS — I471 Supraventricular tachycardia: Secondary | ICD-10-CM | POA: Diagnosis not present

## 2017-08-26 DIAGNOSIS — I350 Nonrheumatic aortic (valve) stenosis: Secondary | ICD-10-CM | POA: Diagnosis not present

## 2017-08-26 DIAGNOSIS — K7460 Unspecified cirrhosis of liver: Secondary | ICD-10-CM | POA: Diagnosis not present

## 2017-08-26 LAB — CBC
HCT: 31.9 % — ABNORMAL LOW (ref 36.0–46.0)
HEMOGLOBIN: 10.3 g/dL — AB (ref 12.0–15.0)
MCH: 31.7 pg (ref 26.0–34.0)
MCHC: 32.3 g/dL (ref 30.0–36.0)
MCV: 98.2 fL (ref 78.0–100.0)
PLATELETS: 121 10*3/uL — AB (ref 150–400)
RBC: 3.25 MIL/uL — ABNORMAL LOW (ref 3.87–5.11)
RDW: 12.8 % (ref 11.5–15.5)
WBC: 4.4 10*3/uL (ref 4.0–10.5)

## 2017-08-26 LAB — MRSA PCR SCREENING: MRSA BY PCR: NEGATIVE

## 2017-08-26 LAB — HEMOGLOBIN A1C
HEMOGLOBIN A1C: 4.7 % — AB (ref 4.8–5.6)
MEAN PLASMA GLUCOSE: 88 mg/dL

## 2017-08-26 LAB — PHENYTOIN LEVEL, TOTAL: PHENYTOIN LVL: 28.5 ug/mL — AB (ref 10.0–20.0)

## 2017-08-26 MED ORDER — CARVEDILOL 12.5 MG PO TABS: 12.5000 mg | ORAL_TABLET | Freq: Two times a day (BID) | ORAL | 1 refills | Status: DC

## 2017-08-26 MED ORDER — PHENYTOIN SODIUM EXTENDED 200 MG PO CAPS: 200.0000 mg | ORAL_CAPSULE | Freq: Every day | ORAL | 1 refills | Status: DC

## 2017-08-26 NOTE — Progress Notes (Signed)
Pt discharged home today per Dr. Carles Collet. Pt's IV site D/C'd and WDL. Pt's VSS. Pt provided with home medication list, discharge instructions and prescriptions. Verbalized understanding. Pt left floor via WC in stable condition accompanied by NT.

## 2017-08-26 NOTE — Consult Note (Signed)
Cardiology Consult    Patient ID: [name]; [id number]; 1940/12/11   Admit date: 08/24/2017 Date of Consult: 08/26/2017  Primary Care Provider: Redmond School, MD Primary Cardiologist: Dr. Harl Bowie   Patient Profile    [name] is a 76 y.o. female with past medical history of CAD (s/p stenting of RCA in 1995, cath in 09/2016 showing minimal CAD), HTN, HLD, moderate AS, pSVT, seizures, carotid artery stenosis and recent CVA (in 02/2017) who is being seen today for the evaluation of AS and syncope at the request of Dr. Carles Collet.   History of Present Illness    [name] was most recently admitted in 04/2017 after suffering a complex left femur fracture in the setting of a mechanical fall. She underwent operative repair and no immediate complications were noted. She was continued on her PTA medication regimen and maintained NSR throughout her admission by review of hospital notes.   She presented to [facility] ED on 08/24/2017 following a syncopal event. She had been sitting on the side of the bed and stood up, experiencing a sudden loss of consciousness at that time. She denies any prodromal symptoms. No associated chest pain, palpitations, lightheadedness, dizziness, or headaches. No recent orthopnea, PND, or lower extremity edema. She estimates her LOC was less than 30 seconds. No post-ictal symptoms noted.   Initial labs showed WBC of 4.9, Hgb 11.8, platelets 167, Na+ 136, K+ 4.0, creatinine 0.66, UA negative, Phenytoin level elevated to 39.6. TSH and CK WNL. Head CT showing mild diffuse cortical atrophy with old right frontal infarct but no acute findings. CXR showing mild blunting of the left costophrenic angle representing atelectasis or a tiny left pleural effusion. EKG shows NSR, HR 62, with no acute ST or T-wave changes. Orthostatics have been checked and negative.   A repeat echocardiogram was obtained on 08/25/2017 and showed a preserved EF of 60% to 65%, no regional wall motion  abnormalities, mild to moderate aortic stenosis, moderate AI, and mild MR, and mild tricuspid regurgitation. Her most recent event monitor in 11/2016 showed NSR with no significant arrhythmias.   She denies any recurrent episodes since the time of her hospitalization. Telemetry has shown NSR with no significant pauses or arrhythmias.    Past Medical History:  Diagnosis Date  . Anemia   . Aortic insufficiency    Moderate  . Back pain   . Carotid stenosis, bilateral   . Cirrhosis ([place])   . Closed left femoral fracture ([place]) 04/21/2017  . Coronary artery disease    Stent x 2 RCA 1995, cardiac catheterization 09/2016 showing only mild atherosclerosis  . DDD (degenerative disc disease), lumbar   . Essential hypertension   . Falls   . GERD (gastroesophageal reflux disease)   . Hiatal hernia   . History of stroke   . Hypercholesteremia   . IBS (irritable bowel syndrome)   . Non-alcoholic fatty liver disease   . OSA (obstructive sleep apnea)    no CPAP  . Pericardial effusion    a. small by echo 2018.  Marland Kitchen PSVT (paroxysmal supraventricular tachycardia) ([place])   . Recurrent UTI   . Seizures ([place])   . Stroke ([place])   . Type 2 diabetes mellitus ([place])    off medication  . Varices, esophageal ([place])     Past Surgical History:  Procedure Laterality Date  . [place]  . APPENDECTOMY    . BACK SURGERY  1985  . CARDIAC CATHETERIZATION  [phone]  Stent to the proximal RCA after MI   . CARDIAC CATHETERIZATION N/A 09/26/2016   Procedure: Left Heart Cath and Coronary Angiography;  Surgeon: Leonie Man, MD;  Location: [place] [facility];  Service: Cardiovascular;  Laterality: N/A;  . CATARACT EXTRACTION W/PHACO Right 07/04/2014   Procedure: CATARACT EXTRACTION PHACO AND INTRAOCULAR LENS PLACEMENT (IOC);  Surgeon: Elta Guadeloupe T. Gershon Crane, MD;  Location: [facility];  Service: Ophthalmology;  Laterality: Right;  CDE:13.13  . COLONOSCOPY    . DILATION AND CURETTAGE OF UTERUS     x2   . Epi Retinal Membrane Peel Left   . ERCP    . ESOPHAGEAL BANDING N/A 04/01/2013   Procedure: ESOPHAGEAL BANDING;  Surgeon: Rogene Houston, MD;  Location[facility];  Service: Endoscopy;  Laterality: N/A;  . ESOPHAGEAL BANDING N/A 05/24/2013   Procedure: ESOPHAGEAL BANDING;  Surgeon: Rogene Houston, MD;  [facility];  Service: Endoscopy;  Laterality: N/A;  . ESOPHAGEAL BANDING N/A 06/21/2014   Procedure: ESOPHAGEAL BANDING;  Surgeon: Rogene Houston, MD;  [facility];  Service: Endoscopy;  Laterality: N/A;  . ESOPHAGOGASTRODUODENOSCOPY N/A 04/01/2013   Procedure: ESOPHAGOGASTRODUODENOSCOPY (EGD);  Surgeon: Rogene Houston, MD;  [facility];  Service: Endoscopy;  Laterality: N/A;  230-rescheduled to 8:30am Ann notified pt  . ESOPHAGOGASTRODUODENOSCOPY N/A 05/24/2013   Procedure: ESOPHAGOGASTRODUODENOSCOPY (EGD);  Surgeon: Rogene Houston, MD;  [facility];  Service: Endoscopy;  Laterality: N/A;  730  . ESOPHAGOGASTRODUODENOSCOPY N/A 06/21/2014   Procedure: ESOPHAGOGASTRODUODENOSCOPY (EGD);  Surgeon: Rogene Houston, MD;  [facility];  Service: Endoscopy;  Laterality: N/A;  930-rescheduled 10/14 @ 1200 Ann to notify pt  . EYE SURGERY  08   cataract surgery of the left eye  . FEMUR IM NAIL Left 03/02/2017   Procedure: INTRAMEDULLARY (IM) NAIL FEMORAL;  Surgeon: Rod Can, MD;  Location: [place];  Service: Orthopedics;  Laterality: Left;  . HARDWARE REMOVAL Right 01/17/2013   Procedure: REMOVAL OF HARDWARE AND EXCISION ULNAR STYLOID RIGHT WRIST;  Surgeon: Tennis Must, MD;  Location: [place];  Service: Orthopedics;  Laterality: Right;  . MYRINGOTOMY  2012   both ears  . ORIF FEMUR FRACTURE Left 04/23/2017   Procedure: OPEN REDUCTION INTERNAL FIXATION (ORIF) DISTAL FEMUR FRACTURE (FRACTURE AROUND FEMORAL NAIL);  Surgeon: Altamese Ceredo, MD;  Location: [place];  Service: Orthopedics;  Laterality: Left;  .  TONSILLECTOMY    . VAGINAL HYSTERECTOMY  1972  . WRIST SURGERY     rt wrist hardwear removal     Home Medications:  Prior to Admission medications   Medication Sig Start Date End Date Taking? Authorizing Provider  aspirin EC 81 MG tablet Take 81 mg by mouth daily.   Yes Jake Samples, PA-C  atorvastatin (LIPITOR) 40 MG tablet Take 40 mg by mouth daily.   Yes Jake Samples, PA-C  carvedilol (COREG) 25 MG tablet Take 25 mg by mouth 2 (two) times daily with a meal.    Yes [provider]  diltiazem (CARDIZEM CD) 120 MG 24 hr capsule Take 120 mg by mouth daily.   Yes Jake Samples, PA-C  furosemide (LASIX) 20 MG tablet Take 20 mg by mouth daily.   Yes [provider]  ibuprofen (ADVIL) 200 MG tablet Take 200 mg by mouth every 6 (six) hours as needed for headache or moderate pain.    Yes [provider]  isosorbide mononitrate (IMDUR) 30 MG  24 hr tablet Take 30 mg by mouth daily.    Yes Jake Samples, PA-C  lisinopril (PRINIVIL,ZESTRIL) 20 MG tablet Take 20 mg by mouth daily.   Yes Delman Cheadle J, PA-C  ondansetron (ZOFRAN) 4 MG tablet Take 4 mg by mouth every 8 (eight) hours as needed for nausea or vomiting.   Yes [provider]  pantoprazole (PROTONIX) 40 MG tablet Take 40 mg by mouth daily.   Yes [provider]  phenytoin (DILANTIN) 100 MG ER capsule Take 100 mg by mouth daily.    Yes [provider]  potassium chloride SA (K-DUR,KLOR-CON) 20 MEQ tablet Take 1 tablet (20 mEq total) by mouth daily. 12/09/16  Yes Lendon Colonel, NP  ranitidine (ZANTAC) 150 MG tablet Take 150 mg by mouth 2 (two) times daily.   Yes [provider]  zolpidem (AMBIEN) 10 MG tablet take 1 tablet by mouth at bedtime if needed 05/01/17  Yes [provider]  vitamin C (ASCORBIC ACID) 500 MG tablet Take 500 mg by mouth 3 (three) times daily.    [provider]    Inpatient Medications: Scheduled Meds: .  aspirin EC  81 mg Oral Daily  . atorvastatin  40 mg Oral q1800  . carvedilol  12.5 mg Oral BID WC  . diltiazem  120 mg Oral Daily  . docusate sodium  100 mg Oral BID  . enoxaparin (LOVENOX) injection  40 mg Subcutaneous Q24H  . isosorbide mononitrate  30 mg Oral Daily  . lisinopril  20 mg Oral Daily  . pantoprazole  40 mg Oral Daily  . phenytoin  100 mg Oral BID  . sodium chloride flush  3 mL Intravenous Q12H  . vitamin C  500 mg Oral TID  . zolpidem  5 mg Oral QHS   Continuous Infusions: . sodium chloride 75 mL/hr (08/26/17 0624)   PRN Meds: acetaminophen **OR** acetaminophen, morphine injection, ondansetron **OR** ondansetron (ZOFRAN) IV, polyethylene glycol  Allergies:    Allergies  Allergen Reactions  . Tape Rash and Other (See Comments)    Please use paper tape    Social History:   Social History   Socioeconomic History  . Marital status: Widowed    Spouse name: Not on file  . Number of children: 2  . Years of education: Not on file  . Highest education level: Not on file  Social Needs  . Financial resource strain: Not on file  . Food insecurity - worry: Not on file  . Food insecurity - inability: Not on file  . Transportation needs - medical: Not on file  . Transportation needs - non-medical: Not on file  Occupational History    Employer: RETIRED  Tobacco Use  . Smoking status: Current Some Day Smoker    Packs/day: 0.25    Years: 50.00    Pack years: 12.50    Types: Cigarettes    Last attempt to quit: 01/13/2012    Years since quitting: 5.6  . Smokeless tobacco: Never Used  Substance and Sexual Activity  . Alcohol use: No    Alcohol/week: 0.0 oz  . Drug use: No  . Sexual activity: Yes    Birth control/protection: Surgical  Other Topics Concern  . Not on file  Social History Narrative  . Not on file     Family History:    Family History  Problem Relation Age of Onset  . Diabetes Mother   . Heart failure Father   . Heart failure Maternal  Aunt         Review of Systems    General:  No chills, fever, night sweats or weight changes.  Cardiovascular:  No chest pain, dyspnea on exertion, edema, orthopnea, palpitations, paroxysmal nocturnal dyspnea. Dermatological: No rash, lesions/masses Respiratory: No cough, dyspnea Urologic: No hematuria, dysuria Abdominal:   No nausea, vomiting, diarrhea, bright red blood per rectum, melena, or hematemesis Neurologic:  No visual changes, wkns, changes in mental status. Positive for syncope.   All other systems reviewed and are otherwise negative except as noted above.  Physical Exam/Data    Vitals:   08/25/17 2034 08/26/17 0046 08/26/17 0445 08/26/17 0657  BP: (!) 116/30 (!) 78/59 (!) 123/44   Pulse: (!) 58 61 61   Resp:  18 18   Temp: 98.5 F (36.9 C) 98.5 F (36.9 C) 98.7 F (37.1 C)   TempSrc: Oral Oral Oral   SpO2: 96% 95% 95%   Weight:    134 lb 9.6 oz (61.1 kg)  Height:        Intake/Output Summary (Last 24 hours) at 08/26/2017 0810 Last data filed at 08/26/2017 0500 Gross per 24 hour  Intake 2280 ml  Output 1850 ml  Net 430 ml   Filed Weights   08/24/17 1239 08/24/17 2233 08/26/17 0657  Weight: 135 lb (61.2 kg) 131 lb 1.6 oz (59.5 kg) 134 lb 9.6 oz (61.1 kg)   Body mass index is 23.84 kg/m.   General: Pleasant, NAD Psych: Normal affect. Neuro: Alert and oriented X 3. Moves all extremities spontaneously. HEENT: Normal  Neck: Supple without JVD. Bilateral bruits. Lungs:  Resp regular and unlabored, CTA without wheezing or rales. Heart: RRR no s3, s4, 2/6 SEM along RUSB. Abdomen: Soft, non-tender, non-distended, BS + x 4.  Extremities: No clubbing, cyanosis or edema. DP/PT/Radials 2+ and equal bilaterally.   EKG:  The EKG was personally reviewed and demonstrates:  NSR, HR 62, with no acute ST or T-wave changes.     Labs/Studies     Relevant CV Studies:  Cardiac Catheterization: 09/2016  The left ventricular systolic function is normal.  LV end  diastolic pressure is normal.  The left ventricular ejection fraction is 55-65% by visual estimate.  There is mild aortic valve stenosis.  Mid RCA lesion, 25 %stenosed.  Ost Ramus lesion, 30 %stenosed.    Essentially minimal coronary artery disease withlesions. There is calcification of the mitral valve annulus and mild callus complication the proximal LAD. There does appear to be a stent in the RCA that is essentially patent.  Nothing to explain the patient's positive troponin.  Plan: Return to nursing unit for ongoing care and TR band removal Further care per primary team   Echocardiogram: 08/25/2017 Study Conclusions  - Left ventricle: The cavity size was normal. Wall thickness was   increased in a pattern of mild LVH. Systolic function was normal.   The estimated ejection fraction was in the range of 60% to 65%.   Wall motion was normal; there were no regional wall motion   abnormalities. The study is not technically sufficient to allow   evaluation of LV diastolic function. - Aortic valve: There was mild to moderate stenosis. There was   moderate regurgitation. Mean gradient (S): 21 mm Hg. Peak   gradient (S): 40 mm Hg. VTI ratio of LVOT to aortic valve: 0.49.   Valve area (VTI): 1.55 cm^2. Valve area (Vmax): 1.59 cm^2. Valve   area (Vmean): 1.6 cm^2. - Mitral valve: Severely calcified annulus.  The findings are   consistent with mild stenosis. There was mild regurgitation. Mean   gradient (D): 5 mm Hg. Valve area by pressure half-time: 1.63   cm^2. Valve area by continuity equation (using LVOT flow): 1.77   cm^2. - Left atrium: The atrium was severely dilated. - Right atrium: Central venous pressure (est): 8 mm Hg. - Atrial septum: No defect or patent foramen ovale was identified. - Tricuspid valve: There was mild regurgitation. - Pulmonary arteries: PA peak pressure: 45 mm Hg (S). - Pericardium, extracardiac: There was no pericardial  effusion.  Impressions:  - Mild LVH with LVEF 60-65% and indeterminate diastolic function.   Severe left atrial enlargement. Severely calcified mitral annulus   with evidence of mild mitral stenosis and mild mitral   regurgitation. Mild to moderate calcific aortic stenosis with   moderate aortic regurgitation. Mild tricuspid regurgitation with   PASP estimate 45 mmHg.   Laboratory Data:  Chemistry Recent Labs  Lab 08/24/17 1339 08/25/17 0441  NA 136 141  K 4.0 3.8  CL 104 107  CO2 26 25  GLUCOSE 147* 88  BUN 14 11  CREATININE 0.66 0.61  CALCIUM 8.4* 8.4*  GFRNONAA >60 >60  GFRAA >60 >60  ANIONGAP 6 9    Recent Labs  Lab 08/24/17 1339  PROT 5.8*  ALBUMIN 3.2*  AST 29  ALT 21  ALKPHOS 153*  BILITOT 0.7   Hematology Recent Labs  Lab 08/24/17 1328 08/25/17 0441 08/26/17 0353  WBC 4.9 5.1 4.4  RBC 3.72* 3.54* 3.25*  HGB 11.8* 11.2* 10.3*  HCT 36.0 34.6* 31.9*  MCV 96.8 97.7 98.2  MCH 31.7 31.6 31.7  MCHC 32.8 32.4 32.3  RDW 12.8 12.8 12.8  PLT 167 127* 121*   Cardiac EnzymesNo results for input(s): TROPONINI in the last 168 hours. No results for input(s): TROPIPOC in the last 168 hours.  BNPNo results for input(s): BNP, PROBNP in the last 168 hours.  DDimer No results for input(s): DDIMER in the last 168 hours.  Radiology/Studies:  Dg Chest 2 View  Result Date: 08/24/2017 CLINICAL DATA:  Syncopal episode today. EXAM: CHEST  2 VIEW COMPARISON:  04/21/2017 and 02/26/2017 FINDINGS: Minimal blunting at the left costophrenic angle may be new. Otherwise, the lungs are clear. No pulmonary edema. Heart and mediastinum are within normal limits. Bridging osteophytes in the thoracic spine. IMPRESSION: Minimal blunting at the left costophrenic angle. Findings could represent mild atelectasis or tiny left pleural effusion. Otherwise, normal chest examination. Electronically Signed   By: Markus Daft M.D.   On: 08/24/2017 15:07   Ct Head Wo Contrast  Result Date:  08/24/2017 CLINICAL DATA:  Altered level of consciousness. EXAM: CT HEAD WITHOUT CONTRAST TECHNIQUE: Contiguous axial images were obtained from the base of the skull through the vertex without intravenous contrast. COMPARISON:  CT scan of February 28, 2017. FINDINGS: Brain: Mild diffuse cortical atrophy is noted. Old right frontal infarction is noted. No mass effect or midline shift is noted. Ventricular size is within normal limits. There is no evidence of mass lesion, hemorrhage or acute infarction. Vascular: No hyperdense vessel or unexpected calcification. Skull: Normal. Negative for fracture or focal lesion. Sinuses/Orbits: No acute finding. Other: None. IMPRESSION: Mild diffuse cortical atrophy. Old right frontal infarction. No acute intracranial abnormality seen. Electronically Signed   By: Marijo Conception, M.D.   On: 08/24/2017 15:34     Assessment & Plan    1. Syncope - occurred when the patient went from sitting to  standing. She denies any prodromal symptoms of palpitations, chest pain, dyspnea, or lightheadedness. LOC was estimated to be less than 30 seconds. No post-ictal symptoms reported.  - Initial labs show Na+ 136, K+ 4.0, creatinine 0.66, UA negative, Phenytoin level elevated to 39.6. TSH and CK WNL. Head CT showing no acute findings. EKG shows NSR, HR 62, with no acute ST or T-wave changes. Orthostatics have been checked and negative. Echo with a preserved EF of 60% to 65% with no regional wall motion abnormalities. Orthostatics have been normal. Event monitor earlier this year showed NSR and telemetry has shown no significant arrhythmias or sinus pauses.  - her episode seems most consistent with a vasovagal etiology. Would recommend repeat carotid dopplers as outlined below in the setting of her known carotid stenosis along with dose adjustment of Phenytoin.    2. CAD - s/p stenting of RCA in 1995, cath in 09/2016 showing minimal CAD. - she denies any recent anginal symptoms. Echo shows  a preserved EF of 60-65% with no regional WMA.  - continue PTA ASA, statin, and BB.   3. pSVT - she denies any recurrent palpitations. Event monitor earlier this year showed NSR.  - continue Coreg 12.54m BID and Cardizem CD 1263mdaily.   4. Moderate AS - Echo this admission shows mild to moderate aortic stenosis with moderate AI.  - this is similar to prior imaging. Continue to follow in the outpatient setting.   5. Carotid Artery Stenosis - carotid dopplers in 0873/5329howed 5092-42%ICA stenosis and < 5068%ICA stenosis.  - would recommended repeat carotid dopplers. This can be obtained in the outpatient setting.   6. Seizure Disorder - on Phenytoin as an outpatient with levels elevated to 39.6 on admission.  - dose adjustment per the admitting team.    For questions or updates, please contact [facility] consult [email or web address] for contact info under Cardiology/STEMI.  Signed, BrErma HeritagePA-C 08/26/2017, 8:10 AM Pager: [phone]  Attending note:  Patient seen and examined. Reviewed records and [facility] the [facility] admitted to the hospital after an episode of syncope that occurred after going from seated to standing position. Estimated loss of consciousness was fairly brief, under 30 seconds, no obvious injuries. She had no preceding symptoms of chest pain or palpitations and no seizure-like activity. She was admitted to the hospital for observation, has had no arrhythmias by cardiac monitoring. She was not found to be orthostatic. She does have a history of aortic stenosis, although this was just recently assessed by echocardiogram which revealed LVEF 60-65%, mild mitral stenosis, and mild to moderate aortic stenosis.  On examination she reports no lightheadedness. Has had constipation. Systolic blood pressure running 160s this morning, although recorded as low as 78 yesterday. Heart rate has been in the 60s by telemetry which  I personally reviewed. Lungs are clear without labored breathing. Cardiac exam reveals RRR with 2/6 systolic murmur. Lab work shows potassium 3.8, creatinine 0.61, hemoglobin 10.3, platelets 121, phenytoin level high. Head CT reveals no acute abnormalities. I personally reviewed her ECG from 08/24/2017 shows sinus bradycardia with prolonged PR interval, poor R wave progression.  Episode of syncope, orthostatic in nature. No definite arrhythmogenic etiology at least based on observation on telemetry. She was not frankly orthostatic on objective assessment. Could have potentially been vasovagal etiology. Current degree of valvular heart disease would not explain this type of sudden event at rest. Agree with reassessing carotid arteries  by Dopplers as an outpatient, although would generally not expect carotid artery disease to cause sudden syncope. No further cardiac workup is planned at this time. She should keep follow-up with Dr. Harl Bowie.  Satira Sark, M.D., F.A.C.C.

## 2017-08-26 NOTE — Discharge Summary (Signed)
Physician Discharge Summary  [name] VQQ:[id number] DOB: [birth year] DOA: 08/24/2017  PCP: Redmond School, MD  Admit date: 08/24/2017 Discharge date: 08/26/2017  Admitted From: Home Disposition:  Home   Recommendations for Outpatient Follow-up:  1. Follow up with PCP in 1 week 2. Please obtain Dilatin level on 08/31/17 at the latest   Discharge Condition: Stable CODE STATUS: FULL Diet recommendation: Heart Healthy   Brief/Interim Summary: 76 year old female with a history of diabetes mellitus, seizure disorder, coronary artery disease with MI in 1995 and 2 RCA stents, moderate AI, NASH cirrhosis presented with a syncopal episode.  The patient states that she was standing up from her while watching television when she passed out.  The next thing she remembered was EMS helping her.  Apparently, the patient's son found the patient on the floor.  She denied any bowel or bladder incontinence.  She denies biting her tongue.  She felt like her speech was slurred when she woke up, but stated that it improved by the time she went to the emergency department.  Patient denied any prodromal symptoms including but not limited to dizziness, chest discomfort, shortness of breath, visual disturbance.  In the emergency department, the patient was afebrile hemodynamically stable.  CT of the brain was unremarkable.  The patient was placed on telemetry.  She did not have any concerning dysrhythmias.  Cardiology was consulted to assist with management.  They did not feel that the patient's degree of aortic stenosis contributed to the patient's syncope.  The patient did not have any concerning dysrhythmias on telemetry.  The recommended continued outpatient follow-up with Dr. Harl Bowie.  There was discrepancy regarding the patient's Dilantin dosing.  After discussing with the patient's family, the patient was actually taking Dilantin 200 mg twice daily.  The patient was instructed to decrease her Dilantin to  200 mg once daily.  She and her family were instructed to follow-up with her primary care provider for a repeat Dilantin level on Monday, August 31, 2017.  Discharge Diagnoses:  Syncope -etiology unclear, but suspect vasovagal component -remain on telemetry--no concerning dysrhythmias -cardiology consult appreciated-- did not feel that dysrhythmia or degree of aortic stenosis contributed to the patient's syncope -Echo--EF 60-65%, mild to moderate AS, moderate AI, PASP 45, mild MS/TR -EEG--negative for epileptiform discharges -orthostatics--negative  Dilantin toxicity -dilantin level 39.1 at presentation>>> 28.5 on the morning of discharge -there is discrepancy between reported and documented dosages -I spoke with family to clarify dosing--the patient takes 200 mg twice daily -She was instructed to decrease to 200 mg once daily and restart Dilantin on August 27, 2017 -She was instructed to follow-up with her primary care provider for repeat Dilantin level on August 31, 2017 at the latest  PSVT -presently in sinus throughout the hospitalization - she denies any recurrent palpitations. Event monitor earlier this year showed NSR -cardiology consult appreciated  Carotid stenosis -doubt this contributed to her syncope -no bruits on exam - carotid dopplers in 43/3295 showed 18-84% LICA stenosis and < 16% RICA stenosis.  - would recommended repeat carotid dopplers. This can be obtained in the outpatient setting.   CAD - history of MI in 1995, received 2 stents to RCA - cath 2007 with patent coronaries.  - cath Jan 2018 nonobstructive disease -no chest pain -personally reviewed EKG--sinus, nonspecific T wave changes - continue PTA ASA, statin, and BB.   Seizure disorder -EEG--no epileptiform discharges -admitted 6/23 to 6/28 with seizure -monitor dilantin level  Essential Hypertension -continue home dose diltiazem,  lisinopril, imdur -pt's coreg decreased to 12.5 mg bid  due to bradycardia and labile BPs -follow up with PCP for BP check  Diabetes mellitus type 2 ?? -Check hemoglobin A1c--4.7  Recent left femoral peri-implant fracture -April 21, 2017 ORIF  Thrombocytopenia -chronic -monitor cbc--remained stable -secondary to NASH cirrhosis      Discharge Instructions  Discharge Instructions    Diet - low sodium heart healthy   Complete by:  As directed    Increase activity slowly   Complete by:  As directed      Allergies as of 08/26/2017      Reactions   Tape Rash, Other (See Comments)   Please use paper tape      Medication List    TAKE these medications   ADVIL 200 MG tablet Generic drug:  ibuprofen Take 200 mg by mouth every 6 (six) hours as needed for headache or moderate pain.   aspirin EC 81 MG tablet Take 81 mg by mouth daily.   atorvastatin 40 MG tablet Commonly known as:  LIPITOR Take 40 mg by mouth daily.   carvedilol 12.5 MG tablet Commonly known as:  COREG Take 1 tablet (12.5 mg total) by mouth 2 (two) times daily with a meal. What changed:    medication strength  how much to take   diltiazem 120 MG 24 hr capsule Commonly known as:  CARDIZEM CD Take 120 mg by mouth daily.   furosemide 20 MG tablet Commonly known as:  LASIX Take 20 mg by mouth daily.   isosorbide mononitrate 30 MG 24 hr tablet Commonly known as:  IMDUR Take 30 mg by mouth daily.   lisinopril 20 MG tablet Commonly known as:  PRINIVIL,ZESTRIL Take 20 mg by mouth daily.   ondansetron 4 MG tablet Commonly known as:  ZOFRAN Take 4 mg by mouth every 8 (eight) hours as needed for nausea or vomiting.   pantoprazole 40 MG tablet Commonly known as:  PROTONIX Take 40 mg by mouth daily.   phenytoin 200 MG ER capsule Commonly known as:  DILANTIN Take 1 capsule (200 mg total) by mouth daily. Start taking on:  08/27/2017 What changed:    medication strength  how much to take   potassium chloride SA 20 MEQ tablet Commonly  known as:  K-DUR,KLOR-CON Take 1 tablet (20 mEq total) by mouth daily.   ranitidine 150 MG tablet Commonly known as:  ZANTAC Take 150 mg by mouth 2 (two) times daily.   vitamin C 500 MG tablet Commonly known as:  ASCORBIC ACID Take 500 mg by mouth 3 (three) times daily.   zolpidem 10 MG tablet Commonly known as:  AMBIEN take 1 tablet by mouth at bedtime if needed       Allergies  Allergen Reactions  . Tape Rash and Other (See Comments)    Please use paper tape    Consultations:  cardiology   Procedures/Studies: Dg Chest 2 View  Result Date: 08/24/2017 CLINICAL DATA:  Syncopal episode today. EXAM: CHEST  2 VIEW COMPARISON:  04/21/2017 and 02/26/2017 FINDINGS: Minimal blunting at the left costophrenic angle may be new. Otherwise, the lungs are clear. No pulmonary edema. Heart and mediastinum are within normal limits. Bridging osteophytes in the thoracic spine. IMPRESSION: Minimal blunting at the left costophrenic angle. Findings could represent mild atelectasis or tiny left pleural effusion. Otherwise, normal chest examination. Electronically Signed   By: Markus Daft M.D.   On: 08/24/2017 15:07   Ct Head Wo Contrast  Result  Date: 08/24/2017 CLINICAL DATA:  Altered level of consciousness. EXAM: CT HEAD WITHOUT CONTRAST TECHNIQUE: Contiguous axial images were obtained from the base of the skull through the vertex without intravenous contrast. COMPARISON:  CT scan of February 28, 2017. FINDINGS: Brain: Mild diffuse cortical atrophy is noted. Old right frontal infarction is noted. No mass effect or midline shift is noted. Ventricular size is within normal limits. There is no evidence of mass lesion, hemorrhage or acute infarction. Vascular: No hyperdense vessel or unexpected calcification. Skull: Normal. Negative for fracture or focal lesion. Sinuses/Orbits: No acute finding. Other: None. IMPRESSION: Mild diffuse cortical atrophy. Old right frontal infarction. No acute intracranial  abnormality seen. Electronically Signed   By: Marijo Conception, M.D.   On: 08/24/2017 15:34         Discharge Exam: Vitals:   08/26/17 0445 08/26/17 0845  BP: (!) 123/44 (!) 167/67  Pulse: 61 76  Resp: 18 18  Temp: 98.7 F (37.1 C) 98.5 F (36.9 C)  SpO2: 95% 97%   Vitals:   08/26/17 0046 08/26/17 0445 08/26/17 0657 08/26/17 0845  BP: (!) 78/59 (!) 123/44  (!) 167/67  Pulse: 61 61  76  Resp: 18 18  18   Temp: 98.5 F (36.9 C) 98.7 F (37.1 C)  98.5 F (36.9 C)  TempSrc: Oral Oral  Oral  SpO2: 95% 95%  97%  Weight:   61.1 kg (134 lb 9.6 oz)   Height:        General: Pt is alert, awake, not in acute distress Cardiovascular: RRR, S1/S2 +, no rubs, no gallops Respiratory: CTA bilaterally, no wheezing, no rhonchi Abdominal: Soft, NT, ND, bowel sounds + Extremities: no edema, no cyanosis   The results of significant diagnostics from this hospitalization (including imaging, microbiology, ancillary and laboratory) are listed below for reference.    Significant Diagnostic Studies: Dg Chest 2 View  Result Date: 08/24/2017 CLINICAL DATA:  Syncopal episode today. EXAM: CHEST  2 VIEW COMPARISON:  04/21/2017 and 02/26/2017 FINDINGS: Minimal blunting at the left costophrenic angle may be new. Otherwise, the lungs are clear. No pulmonary edema. Heart and mediastinum are within normal limits. Bridging osteophytes in the thoracic spine. IMPRESSION: Minimal blunting at the left costophrenic angle. Findings could represent mild atelectasis or tiny left pleural effusion. Otherwise, normal chest examination. Electronically Signed   By: Markus Daft M.D.   On: 08/24/2017 15:07   Ct Head Wo Contrast  Result Date: 08/24/2017 CLINICAL DATA:  Altered level of consciousness. EXAM: CT HEAD WITHOUT CONTRAST TECHNIQUE: Contiguous axial images were obtained from the base of the skull through the vertex without intravenous contrast. COMPARISON:  CT scan of February 28, 2017. FINDINGS: Brain: Mild diffuse  cortical atrophy is noted. Old right frontal infarction is noted. No mass effect or midline shift is noted. Ventricular size is within normal limits. There is no evidence of mass lesion, hemorrhage or acute infarction. Vascular: No hyperdense vessel or unexpected calcification. Skull: Normal. Negative for fracture or focal lesion. Sinuses/Orbits: No acute finding. Other: None. IMPRESSION: Mild diffuse cortical atrophy. Old right frontal infarction. No acute intracranial abnormality seen. Electronically Signed   By: Marijo Conception, M.D.   On: 08/24/2017 15:34     Microbiology: Recent Results (from the past 240 hour(s))  MRSA PCR Screening     Status: None   Collection Time: 08/26/17  9:22 AM  Result Value Ref Range Status   MRSA by PCR NEGATIVE NEGATIVE Final    Comment:  The GeneXpert MRSA Assay (FDA approved for NASAL specimens only), is one component of a comprehensive MRSA colonization surveillance program. It is not intended to diagnose MRSA infection nor to guide or monitor treatment for MRSA infections.      Labs: Basic Metabolic Panel: Recent Labs  Lab 08/24/17 1339 08/25/17 0441  NA 136 141  K 4.0 3.8  CL 104 107  CO2 26 25  GLUCOSE 147* 88  BUN 14 11  CREATININE 0.66 0.61  CALCIUM 8.4* 8.4*   Liver Function Tests: Recent Labs  Lab 08/24/17 1339  AST 29  ALT 21  ALKPHOS 153*  BILITOT 0.7  PROT 5.8*  ALBUMIN 3.2*   Recent Labs  Lab 08/24/17 1339  LIPASE 24   No results for input(s): AMMONIA in the last 168 hours. CBC: Recent Labs  Lab 08/24/17 1328 08/25/17 0441 08/26/17 0353  WBC 4.9 5.1 4.4  HGB 11.8* 11.2* 10.3*  HCT 36.0 34.6* 31.9*  MCV 96.8 97.7 98.2  PLT 167 127* 121*   Cardiac Enzymes: Recent Labs  Lab 08/25/17 0441  CKTOTAL 50   BNP: Invalid input(s): POCBNP CBG: Recent Labs  Lab 08/24/17 1332  GLUCAP 121*    Time coordinating discharge:  Greater than 30 minutes  Signed:  Orson Eva, DO [facility] Pager: [phone] 08/26/2017, 3:22 PM

## 2017-08-26 NOTE — Procedures (Signed)
  [place] A. Merlene Laughter, MD     [email or web address]           HISTORY: The patient is a 76 year old female who presents with an episode of loss of consciousness and syncope.  The study is being done to evaluate for unwitnessed seizure as an etiology.  MEDICATIONS: Scheduled Meds: Continuous Infusions: PRN Meds:.  Prior to Admission medications   Medication Sig Start Date End Date Taking? Authorizing Provider  aspirin EC 81 MG tablet Take 81 mg by mouth daily.    Jake Samples, PA-C  atorvastatin (LIPITOR) 40 MG tablet Take 40 mg by mouth daily.    Jake Samples, PA-C  carvedilol (COREG) 25 MG tablet Take 25 mg by mouth 2 (two) times daily with a meal.     [provider]  diltiazem (CARDIZEM CD) 120 MG 24 hr capsule Take 120 mg by mouth daily.    Jake Samples, PA-C  furosemide (LASIX) 20 MG tablet Take 20 mg by mouth daily.    [provider]  ibuprofen (ADVIL) 200 MG tablet Take 200 mg by mouth every 6 (six) hours as needed for headache or moderate pain.     [provider]  isosorbide mononitrate (IMDUR) 30 MG 24 hr tablet Take 30 mg by mouth daily.     Delman Cheadle J, PA-C  lisinopril (PRINIVIL,ZESTRIL) 20 MG tablet Take 20 mg by mouth daily.    Delman Cheadle J, PA-C  ondansetron (ZOFRAN) 4 MG tablet Take 4 mg by mouth every 8 (eight) hours as needed for nausea or vomiting.    [provider]  pantoprazole (PROTONIX) 40 MG tablet Take 40 mg by mouth daily.    [provider]  phenytoin (DILANTIN) 100 MG ER capsule Take 100 mg by mouth daily.     [provider]  potassium chloride SA (K-DUR,KLOR-CON) 20 MEQ tablet Take 1 tablet (20 mEq total) by mouth daily. 12/09/16   Lendon Colonel, NP  ranitidine (ZANTAC) 150 MG tablet Take 150 mg by mouth 2 (two) times daily.    [provider]  vitamin C (ASCORBIC ACID) 500 MG tablet Take 500 mg by mouth 3 (three) times daily.     [provider]  zolpidem (AMBIEN) 10 MG tablet take 1 tablet by mouth at bedtime if needed 05/01/17   [provider]      ANALYSIS: A 16 channel recording using standard 10 20 measurements is conducted for 20 minutes. There is a well-formed posterior dominant rhythm of nine hertz which attenuates with eye-opening.  There is beta activity observed in the frontal areas.  Awake and sleep architecture is observed.  K complexes and sleep spindles are observed consistent with stage II non-REM sleep.  Photic stimulation and hypoventilation were not carried out.  There is no focal or lateralized slowing.  There is no epileptiform activity is observed.   IMPRESSION: This is a normal recording of the awake and sleep states.      Arvie Villarruel A. Merlene Laughter, M.D.  Diplomate, Tax adviser of Psychiatry and Neurology ( Neurology).

## 2017-08-27 DIAGNOSIS — S72302D Unspecified fracture of shaft of left femur, subsequent encounter for closed fracture with routine healing: Secondary | ICD-10-CM | POA: Diagnosis not present

## 2017-08-27 DIAGNOSIS — Z9181 History of falling: Secondary | ICD-10-CM | POA: Diagnosis not present

## 2017-08-27 DIAGNOSIS — E1151 Type 2 diabetes mellitus with diabetic peripheral angiopathy without gangrene: Secondary | ICD-10-CM | POA: Diagnosis not present

## 2017-08-27 DIAGNOSIS — I2510 Atherosclerotic heart disease of native coronary artery without angina pectoris: Secondary | ICD-10-CM | POA: Diagnosis not present

## 2017-08-27 DIAGNOSIS — I10 Essential (primary) hypertension: Secondary | ICD-10-CM | POA: Diagnosis not present

## 2017-08-27 DIAGNOSIS — M5136 Other intervertebral disc degeneration, lumbar region: Secondary | ICD-10-CM | POA: Diagnosis not present

## 2017-08-27 DIAGNOSIS — Z8673 Personal history of transient ischemic attack (TIA), and cerebral infarction without residual deficits: Secondary | ICD-10-CM | POA: Diagnosis not present

## 2017-08-27 DIAGNOSIS — D649 Anemia, unspecified: Secondary | ICD-10-CM | POA: Diagnosis not present

## 2017-08-27 DIAGNOSIS — G4733 Obstructive sleep apnea (adult) (pediatric): Secondary | ICD-10-CM | POA: Diagnosis not present

## 2017-08-27 DIAGNOSIS — Z87440 Personal history of urinary (tract) infections: Secondary | ICD-10-CM | POA: Diagnosis not present

## 2017-08-27 DIAGNOSIS — I350 Nonrheumatic aortic (valve) stenosis: Secondary | ICD-10-CM | POA: Diagnosis not present

## 2017-08-27 DIAGNOSIS — K7581 Nonalcoholic steatohepatitis (NASH): Secondary | ICD-10-CM | POA: Diagnosis not present

## 2017-08-27 DIAGNOSIS — Z7982 Long term (current) use of aspirin: Secondary | ICD-10-CM | POA: Diagnosis not present

## 2017-09-02 ENCOUNTER — Other Ambulatory Visit: Payer: Self-pay | Admitting: *Deleted

## 2017-09-02 DIAGNOSIS — Z8673 Personal history of transient ischemic attack (TIA), and cerebral infarction without residual deficits: Secondary | ICD-10-CM | POA: Diagnosis not present

## 2017-09-02 DIAGNOSIS — I10 Essential (primary) hypertension: Secondary | ICD-10-CM | POA: Diagnosis not present

## 2017-09-02 DIAGNOSIS — E1151 Type 2 diabetes mellitus with diabetic peripheral angiopathy without gangrene: Secondary | ICD-10-CM | POA: Diagnosis not present

## 2017-09-02 DIAGNOSIS — I2510 Atherosclerotic heart disease of native coronary artery without angina pectoris: Secondary | ICD-10-CM | POA: Diagnosis not present

## 2017-09-02 DIAGNOSIS — D649 Anemia, unspecified: Secondary | ICD-10-CM | POA: Diagnosis not present

## 2017-09-02 DIAGNOSIS — G4733 Obstructive sleep apnea (adult) (pediatric): Secondary | ICD-10-CM | POA: Diagnosis not present

## 2017-09-02 DIAGNOSIS — I350 Nonrheumatic aortic (valve) stenosis: Secondary | ICD-10-CM | POA: Diagnosis not present

## 2017-09-02 DIAGNOSIS — S72302D Unspecified fracture of shaft of left femur, subsequent encounter for closed fracture with routine healing: Secondary | ICD-10-CM | POA: Diagnosis not present

## 2017-09-02 DIAGNOSIS — Z9181 History of falling: Secondary | ICD-10-CM | POA: Diagnosis not present

## 2017-09-02 DIAGNOSIS — K7581 Nonalcoholic steatohepatitis (NASH): Secondary | ICD-10-CM | POA: Diagnosis not present

## 2017-09-02 DIAGNOSIS — M5136 Other intervertebral disc degeneration, lumbar region: Secondary | ICD-10-CM | POA: Diagnosis not present

## 2017-09-02 NOTE — Patient Outreach (Addendum)
[name]   09/02/2017  Jaidy Cottam 09-22-1940 [id number]  Ivania Teagarden is an 76 y.o. female with medical history significant of anemia, aortic insufficiency, back pain carotid artery stenosis, liver cirrhosis due to NASH, esophageal varices, CAD, lumbar DDD, essential hypertension, GERD/hiatal hernia, history of CVA, hyperlipidemia, obstructive sleep apnea, PSVT, recurring UTIs, type 2 diabetes,and falls in June and August 2018 resulting in a Closed left femur fracture  Mrs Sayler was referred to [place] for transition of care calls by Gildardo Griffes, [facility] SW after she was discharged from [facility] on 06/22/17 - (need RN CM follow upfor Transitions of Care)  On 10/20/16 Cm was contacted by Mrs Koslowski grand daughter, [name] with increased complaints of poor gait and pain of her left lower extremity.  Mrs Amacher was admitted to the hospital on 08/24/17 and discharged on 08/26/17 after being treated for syncope and dilantin toxicity   Subjective: "I am a little nauseated but better today"  Objective: BP (!) 160/84   Pulse 81   Temp 98.4 F (36.9 C) (Oral)   Resp 20   SpO2 98%    Review of Systems  Constitutional: Negative for chills, diaphoresis, fever, malaise/fatigue and weight loss.  HENT: Negative.   Eyes: Negative.   Respiratory: Negative.   Cardiovascular: Negative.   Gastrointestinal: Positive for constipation and nausea.       Patient states she believes related to eating eggs "last time I had them I got sick too>" Last BM 09/01/17   Genitourinary: Negative.   Musculoskeletal: Negative.   Skin: Negative.   Neurological: Positive for dizziness and weakness.  Endo/Heme/Allergies: Negative.   Psychiatric/Behavioral: Negative.     Physical Exam  Constitutional: She is oriented to person, place, and time. She appears well-developed and well-nourished.  HENT:  Head: Normocephalic and atraumatic.  Eyes: Conjunctivae are normal.  Pupils are equal, round, and reactive to light.  Neck: Normal range of motion. Neck supple.  Cardiovascular: Normal rate and regular rhythm.  Respiratory: Effort normal and breath sounds normal.  GI: Soft.  Musculoskeletal: She exhibits tenderness.  Neurological: She is alert and oriented to person, place, and time.  Skin: Skin is warm and dry.  Psychiatric: She has a normal mood and affect. Her behavior is normal. Judgment and thought content normal.    Encounter Medications:   Outpatient Encounter Medications as of 09/02/2017  Medication Sig Note  . phenytoin (DILANTIN) 200 MG ER capsule Take 1 capsule (200 mg total) by mouth daily.   . potassium chloride SA (K-DUR,KLOR-CON) 20 MEQ tablet Take 1 tablet (20 mEq total) by mouth daily. 06/26/2017: Taking 20 mEQ   . ranitidine (ZANTAC) 150 MG tablet Take 150 mg by mouth 2 (two) times daily.   Marland Kitchen aspirin EC 81 MG tablet Take 81 mg by mouth daily.   Marland Kitchen atorvastatin (LIPITOR) 40 MG tablet Take 40 mg by mouth daily.   . carvedilol (COREG) 12.5 MG tablet Take 1 tablet (12.5 mg total) by mouth 2 (two) times daily with a meal.   . diltiazem (CARDIZEM CD) 120 MG 24 hr capsule Take 120 mg by mouth daily.   . furosemide (LASIX) 20 MG tablet Take 20 mg by mouth daily.   Marland Kitchen ibuprofen (ADVIL) 200 MG tablet Take 200 mg by mouth every 6 (six) hours as needed for headache or moderate pain.    . isosorbide mononitrate (IMDUR) 30 MG 24 hr tablet Take 30 mg by mouth daily.    Marland Kitchen  lisinopril (PRINIVIL,ZESTRIL) 20 MG tablet Take 20 mg by mouth daily.   . ondansetron (ZOFRAN) 4 MG tablet Take 4 mg by mouth every 8 (eight) hours as needed for nausea or vomiting.   . pantoprazole (PROTONIX) 40 MG tablet Take 40 mg by mouth daily.   . vitamin C (ASCORBIC ACID) 500 MG tablet Take 500 mg by mouth 3 (three) times daily.   Marland Kitchen zolpidem (AMBIEN) 10 MG tablet take 1 tablet by mouth at bedtime if needed    No facility-administered encounter medications on file as of  09/02/2017.     Functional Status:   In your present state of health, do you have any difficulty performing the following activities: 08/24/2017 07/06/2017  Hearing? N N  Vision? N N  Difficulty concentrating or making decisions? N N  Walking or climbing stairs? Y Y  Dressing or bathing? N Y  Doing errands, shopping? N Y  Conservation officer, nature and eating ? - N  Using the Toilet? - N  In the past six months, have you accidently leaked urine? - N  Do you have problems with loss of bowel control? - N  Managing your Medications? - N  Managing your Finances? - N  Housekeeping or managing your Housekeeping? - N  Some recent data might be hidden    Fall/Depression Screening:    Fall Risk  07/06/2017 03/09/2017  Falls in the past year? Yes Yes  Number falls in past yr: 2 or more 1  Injury with Fall? Yes Yes  Risk Factor Category  High Fall Risk -  Risk for fall due to : History of fall(s) Impaired balance/gait;Impaired mobility  Risk for fall due to: Comment history of one fall in 2018 -  Follow up - Falls prevention discussed   PHQ 2/9 Scores 07/06/2017 03/09/2017  PHQ - 2 Score 0 0    Assessment:   CM met with Mrs Weier at her home. She came to the living room in her wheelchair with her left AFO intact.  She is being visited to day by her grand daughters and her son's girlfriend. She reports she was given a new kitten for christmas She now lives with three dogs and two cats.  Hypertension BP elevated today Cm recommended Mrs Klinger take her BP medication She reports having a wrist cuff to check her BP  CM reviewed that the best method to take her bp with  Her cuff is to place it on her right wrist,  to lay her wrist  cross her chest and to at make sure her arms and ankles are not crossed.  Medications- CM reviewed the hospital's after summary review's medication changes to include decreasing her dilantin from 200 mg bid to 200  Mg qd and to take coreg 12.5 mg bid with meals "I think I was  taking too much Dilantin " Mrs Rance inquired why she was still taking Dilantin  Follow up appointments - No follow pcp or specialist appointments made per pt related to the holiday She states she prefers to make theses appointments herself starting today 09/02/17 F/u appt with [facility] on 02/02/18   CAD Mrs Lalley reports she saw Dr Harl Bowie, cardiology 3-4 months ago. She reports that she received a letter from Dr Harl Bowie for her to complete an office visit for a lipid panel Mrs Milbourne states she will call to make a cardiology appointment.   DM diet controlled DM, no use of mediations- on 08/24/17 her A1c =5.6  This home visit Concern -Nausea/ Bowel elimination Mrs Simonet report nausea in which she believes may be related to eating "eggs this morning" Reports previous episode of nausea with intake of eggs Mrs Portugal reports that every since her stroke she has had to manually remove fecal impactions She reports she has to remove most of the stool then she is able to eliminate the rest She is taking a stool softener, drinks coffee or warm liquids Have coffee in am and report an intake of a balanced diet that includes fiber, vegetables and adequate fluid intake.  CM discussed possibly adding pericolace and making an appointment with her GI provider.  She reports she can call Allen Norris, Dr Olevia Perches assistant   Plan: Encouraged follow appointments with her pcp, neurologist, cardiologist and orthopedic providers Follow up with Mrs Cruzen in 2-4 weeks Route note to care team members EPIC In basket message to Dr Merlene Laughter, neurologist to share Mrs Decandia concern about continued use of Dilantin EPIC In basket message to T Setzer to share her GI concerns plus possible side effects of dilantin  [facility]N CM Care Plan Problem One     Most Recent Value  Care Plan Problem One  Transition of care from [place] to home (medications, home health/DME needs, referrals/appointments)  Role Documenting the Problem  One  Care Management [place] for Problem One  Active  [facility] Long Term Goal   Patient will have all services/DME for transition of care to home from snf  [place] Term Goal Start Date  06/24/17  [facility] Long Term Goal Met Date  07/22/17  Interventions for Problem One Long Term Goal  TOC assessment, pcp/SW/home health collaboration, Education on home care, meds, diet, home health, reconcile medications  [facility] CM Short Term Goal #1   Patient will have medications reconciled and home health ordered and DME needed for her care  [facility] CM Short Term Goal #1 Start Date  06/24/17  [facility] CM Short Term Goal #1 Met Date  07/22/17  Interventions for Short Term Goal #1  pcp/SW/home health collaboration, Education on home care, meds, diet, home health, reconcile medications  THN CM Short Term Goal #2   Patient will receive a referral and appointment for neurology   [facility] CM Short Term Goal #2 Start Date  06/26/17  [facility] CM Short Term Goal #2 Met Date  07/17/17  Interventions for Short Term Goal #2  Review EPIC chart for seizure onset/tx, contact neurology for referral, assist with referral clinicals, obtain neurology appointment,     [facility] CM Care Plan Problem Three     Most Recent Value  Care Plan for Problem Three  Active  [facility] Long Term Goal   over the next 60 days paietn will have a bowel elimination plan that shis is following  [facility]N Long Term Goal Start Date  09/02/17  Interventions for Problem Three Long Term Goal  Assess bowel elimination, IIdenify Discussed interventions to improve bowel elimination, in basket messssages to GI and orthop  [facility]N CM Short Term Goal #1   over the next 14 days patient will have less GI s/s and non constipated stool  [facility]N CM Short Term Goal #1 Start Date  09/02/17  Interventions for Short Term Goal #1  EPIC in basket messages to GI and neurology [place], Educate on s/e dilantl Assess bowel elimination, IIdenify Discussed interventions to improve bowel elimination, in basket  messssages to GI and orthop      Kimberly L. Lavina Hamman, RN, BSN, CCM  [place] Management [phone]

## 2017-09-03 ENCOUNTER — Other Ambulatory Visit: Payer: Self-pay | Admitting: *Deleted

## 2017-09-03 NOTE — Patient Outreach (Signed)
[name]  09/03/2017  [name] 09-19-1940 [id number]   Late entry for 12/21/8 1535  Transition of care   [facility] Cm called and spoke with [name] on 08/28/17 after she was discharged home from the hospital on 08/26/17 after being diagnosed with syncope, dilantin toxicity   CM welcomed her home and reviewed the transition of care assessment with her. [name] informs Cm that "it was because of taking too much Dilantin"  She confirms she has her discharge instructions, has obtained her medications, is aware of the medication changes and her diet recommendations.  She continues to have the support of her son and grand daughters  Home health PT continues to be available   Plans Continue to follow up with [name] for transition of care services with home visit in 2-4 weeks   Kimberly L. Lavina Hamman, RN, BSN, [place] Care Management [phone]

## 2017-09-04 ENCOUNTER — Encounter (INDEPENDENT_AMBULATORY_CARE_PROVIDER_SITE_OTHER): Payer: Self-pay | Admitting: Internal Medicine

## 2017-09-04 ENCOUNTER — Ambulatory Visit (INDEPENDENT_AMBULATORY_CARE_PROVIDER_SITE_OTHER): Payer: Medicare Other | Admitting: Internal Medicine

## 2017-09-04 VITALS — BP 130/70 | HR 64 | Temp 97.9°F | Ht 63.0 in | Wt 125.0 lb

## 2017-09-04 DIAGNOSIS — D649 Anemia, unspecified: Secondary | ICD-10-CM | POA: Diagnosis not present

## 2017-09-04 DIAGNOSIS — I350 Nonrheumatic aortic (valve) stenosis: Secondary | ICD-10-CM | POA: Diagnosis not present

## 2017-09-04 DIAGNOSIS — K7581 Nonalcoholic steatohepatitis (NASH): Secondary | ICD-10-CM | POA: Diagnosis not present

## 2017-09-04 DIAGNOSIS — I2510 Atherosclerotic heart disease of native coronary artery without angina pectoris: Secondary | ICD-10-CM | POA: Diagnosis not present

## 2017-09-04 DIAGNOSIS — S72302D Unspecified fracture of shaft of left femur, subsequent encounter for closed fracture with routine healing: Secondary | ICD-10-CM | POA: Diagnosis not present

## 2017-09-04 DIAGNOSIS — E1151 Type 2 diabetes mellitus with diabetic peripheral angiopathy without gangrene: Secondary | ICD-10-CM | POA: Diagnosis not present

## 2017-09-04 DIAGNOSIS — Z8673 Personal history of transient ischemic attack (TIA), and cerebral infarction without residual deficits: Secondary | ICD-10-CM | POA: Diagnosis not present

## 2017-09-04 DIAGNOSIS — I10 Essential (primary) hypertension: Secondary | ICD-10-CM | POA: Diagnosis not present

## 2017-09-04 DIAGNOSIS — M5136 Other intervertebral disc degeneration, lumbar region: Secondary | ICD-10-CM | POA: Diagnosis not present

## 2017-09-04 DIAGNOSIS — R110 Nausea: Secondary | ICD-10-CM

## 2017-09-04 DIAGNOSIS — G4733 Obstructive sleep apnea (adult) (pediatric): Secondary | ICD-10-CM | POA: Diagnosis not present

## 2017-09-04 DIAGNOSIS — Z9181 History of falling: Secondary | ICD-10-CM | POA: Diagnosis not present

## 2017-09-04 LAB — CBC WITH DIFFERENTIAL/PLATELET
BASOS ABS: 9 {cells}/uL (ref 0–200)
Basophils Relative: 0.2 %
Eosinophils Absolute: 172 cells/uL (ref 15–500)
Eosinophils Relative: 4 %
HEMATOCRIT: 35.4 % (ref 35.0–45.0)
HEMOGLOBIN: 12 g/dL (ref 11.7–15.5)
LYMPHS ABS: 942 {cells}/uL (ref 850–3900)
MCH: 31.7 pg (ref 27.0–33.0)
MCHC: 33.9 g/dL (ref 32.0–36.0)
MCV: 93.7 fL (ref 80.0–100.0)
MPV: 9.4 fL (ref 7.5–12.5)
Monocytes Relative: 9.3 %
NEUTROS ABS: 2778 {cells}/uL (ref 1500–7800)
Neutrophils Relative %: 64.6 %
Platelets: 136 10*3/uL — ABNORMAL LOW (ref 140–400)
RBC: 3.78 10*6/uL — AB (ref 3.80–5.10)
RDW: 11.9 % (ref 11.0–15.0)
Total Lymphocyte: 21.9 %
WBC: 4.3 10*3/uL (ref 3.8–10.8)
WBCMIX: 400 {cells}/uL (ref 200–950)

## 2017-09-04 LAB — LIPID PANEL
CHOL/HDL RATIO: 2.3 (calc) (ref <5.0)
CHOLESTEROL: 177 mg/dL (ref <200)
HDL: 78 mg/dL (ref >50)
LDL CHOLESTEROL (CALC): 83 mg/dL
NON-HDL CHOLESTEROL (CALC): 99 mg/dL (ref <130)
Triglycerides: 77 mg/dL (ref <150)

## 2017-09-04 LAB — COMPREHENSIVE METABOLIC PANEL
AG Ratio: 1.7 (calc) (ref 1.0–2.5)
ALBUMIN MSPROF: 3.5 g/dL — AB (ref 3.6–5.1)
ALT: 39 U/L — AB (ref 6–29)
AST: 36 U/L — ABNORMAL HIGH (ref 10–35)
Alkaline phosphatase (APISO): 188 U/L — ABNORMAL HIGH (ref 33–130)
BILIRUBIN TOTAL: 0.7 mg/dL (ref 0.2–1.2)
BUN: 16 mg/dL (ref 7–25)
CALCIUM: 9 mg/dL (ref 8.6–10.4)
CO2: 27 mmol/L (ref 20–32)
CREATININE: 0.72 mg/dL (ref 0.60–0.93)
Chloride: 110 mmol/L (ref 98–110)
GLUCOSE: 101 mg/dL — AB (ref 65–99)
Globulin: 2.1 g/dL (calc) (ref 1.9–3.7)
Potassium: 5.2 mmol/L (ref 3.5–5.3)
SODIUM: 145 mmol/L (ref 135–146)
TOTAL PROTEIN: 5.6 g/dL — AB (ref 6.1–8.1)

## 2017-09-04 LAB — PHENYTOIN LEVEL, TOTAL: Phenytoin, Total: 4.6 mg/L — ABNORMAL LOW (ref 10.0–20.0)

## 2017-09-04 NOTE — Progress Notes (Signed)
Subjective:    Patient ID: [name], female    DOB: February 18, 1941, 76 y.o.   MRN: [id number]  HPI States she fell May of this year and was told she had a left hip but however she did not have surgery.  She did rehab at [facility] from June thru August. [facility] again and she had surgery on her left femur.  She again went to Rehab from August thru October. She is now back at home.  She states today she has some nausea. She has had nausea for about a month. The nausea is "all the time".  Occurs randomly. The nausea has not stopped her from eating.  She is walking with a walker.  She denies any abdominal pain. She has a BM every 3 days.  No melena or BRRB.  Recently in hospital for dilantin toxicity.      Review of Systems Past Medical History:  Diagnosis Date  . Anemia   . Aortic insufficiency    Moderate  . Back pain   . Carotid stenosis, bilateral   . Cirrhosis ([place])   . Closed left femoral fracture ([place]) 04/21/2017  . Coronary artery disease    Stent x 2 RCA 1995, cardiac catheterization 09/2016 showing only mild atherosclerosis  . DDD (degenerative disc disease), lumbar   . Essential hypertension   . Falls   . GERD (gastroesophageal reflux disease)   . Hiatal hernia   . History of stroke   . Hypercholesteremia   . IBS (irritable bowel syndrome)   . Non-alcoholic fatty liver disease   . OSA (obstructive sleep apnea)    no CPAP  . Pericardial effusion    a. small by echo 2018.  Marland Kitchen PSVT (paroxysmal supraventricular tachycardia) ([place])   . Recurrent UTI   . Seizures ([place])   . Stroke ([place])   . Type 2 diabetes mellitus ([place])    off medication  . Varices, esophageal ([place])     Past Surgical History:  Procedure Laterality Date  . [place]  . APPENDECTOMY    . BACK SURGERY  1985  . CARDIAC CATHETERIZATION  [phone]   Stent to the proximal RCA after MI   . CARDIAC CATHETERIZATION N/A 09/26/2016   Procedure: Left Heart Cath and Coronary Angiography;   Surgeon: Leonie Man, MD;  Location: [facility] CV [facility];  Service: Cardiovascular;  Laterality: N/A;  . CATARACT EXTRACTION W/PHACO Right 07/04/2014   Procedure: CATARACT EXTRACTION PHACO AND INTRAOCULAR LENS PLACEMENT (IOC);  Surgeon: Elta Guadeloupe T. Gershon Crane, MD;  Location: [facility] OR[facility];  Service: Ophthalmology;  Laterality: Right;  CDE:13.13  . COLONOSCOPY    . DILATION AND CURETTAGE OF UTERUS     x2  . Epi Retinal Membrane Peel Left   . ERCP    . ESOPHAGEAL BANDING N/A 04/01/2013   Procedure: ESOPHAGEAL BANDING;  Surgeon: Rogene Houston, MD;  Location: [facility] END[facility];  Service: Endoscopy;  Laterality: N/A;  . ESOPHAGEAL BANDING N/A 05/24/2013   Procedure: ESOPHAGEAL BANDING;  Surgeon: Rogene Houston, MD;  Location: [facility];  Service: Endoscopy;  Laterality: N/A;  . ESOPHAGEAL BANDING N/A 06/21/2014   Procedure: ESOPHAGEAL BANDING;  Surgeon: Rogene Houston, MD;  [facility];  Service: Endoscopy;  Laterality: N/A;  . ESOPHAGOGASTRODUODENOSCOPY N/A 04/01/2013   Procedure: ESOPHAGOGASTRODUODENOSCOPY (EGD);  Surgeon: Rogene Houston, MD;  [facility];  Service: Endoscopy;  Laterality: N/A;  230-rescheduled to 8:30am Ann notified pt  . ESOPHAGOGASTRODUODENOSCOPY N/A 05/24/2013  Procedure: ESOPHAGOGASTRODUODENOSCOPY (EGD);  Surgeon: Rogene Houston, MD;  [facility];  Service: Endoscopy;  Laterality: N/A;  730  . ESOPHAGOGASTRODUODENOSCOPY N/A 06/21/2014   Procedure: ESOPHAGOGASTRODUODENOSCOPY (EGD);  Surgeon: Rogene Houston, MD;  [facility];  Service: Endoscopy;  Laterality: N/A;  930-rescheduled 10/14 @ 1200 Ann to notify pt  . EYE SURGERY  08   cataract surgery of the left eye  . FEMUR IM NAIL Left 03/02/2017   Procedure: INTRAMEDULLARY (IM) NAIL FEMORAL;  Surgeon: Rod Can, MD;  Location: [facility];  Service: Orthopedics;  Laterality: Left;  . HARDWARE REMOVAL Right 01/17/2013   Procedure: REMOVAL OF HARDWARE AND EXCISION ULNAR STYLOID RIGHT  WRIST;  Surgeon: Tennis Must, MD;  Location: [facility];  Service: Orthopedics;  Laterality: Right;  . MYRINGOTOMY  2012   both ears  . ORIF FEMUR FRACTURE Left 04/23/2017   Procedure: OPEN REDUCTION INTERNAL FIXATION (ORIF) DISTAL FEMUR FRACTURE (FRACTURE AROUND FEMORAL NAIL);  Surgeon: Altamese Wallowa, MD;  Location: [facility];  Service: Orthopedics;  Laterality: Left;  . TONSILLECTOMY    . VAGINAL HYSTERECTOMY  1972  . WRIST SURGERY     rt wrist hardwear removal    Allergies  Allergen Reactions  . Tape Rash and Other (See Comments)    Please use paper tape    Current Outpatient Medications on File Prior to Visit  Medication Sig Dispense Refill  . aspirin EC 81 MG tablet Take 81 mg by mouth daily.    Marland Kitchen atorvastatin (LIPITOR) 40 MG tablet Take 40 mg by mouth daily.    . carvedilol (COREG) 12.5 MG tablet Take 1 tablet (12.5 mg total) by mouth 2 (two) times daily with a meal. 60 tablet 1  . diltiazem (CARDIZEM CD) 120 MG 24 hr capsule Take 120 mg by mouth daily.    Marland Kitchen docusate sodium (COLACE) 100 MG capsule Take 100 mg by mouth 2 (two) times daily.    . furosemide (LASIX) 20 MG tablet Take 20 mg by mouth daily.    Marland Kitchen ibuprofen (ADVIL) 200 MG tablet Take 200 mg by mouth every 6 (six) hours as needed for headache or moderate pain.     . isosorbide mononitrate (IMDUR) 30 MG 24 hr tablet Take 30 mg by mouth daily.     Marland Kitchen lisinopril (PRINIVIL,ZESTRIL) 20 MG tablet Take 20 mg by mouth daily.    . ondansetron (ZOFRAN) 4 MG tablet Take 4 mg by mouth every 8 (eight) hours as needed for nausea or vomiting.    . pantoprazole (PROTONIX) 40 MG tablet Take 40 mg by mouth daily.    . phenytoin (DILANTIN) 200 MG ER capsule Take 1 capsule (200 mg total) by mouth daily. (Patient taking differently: Take 100 mg by mouth daily. ) 30 capsule 1  . potassium chloride SA (K-DUR,KLOR-CON) 20 MEQ tablet Take 1 tablet (20 mEq total) by mouth daily. 90 tablet 3  . ranitidine (ZANTAC) 150 MG tablet Take  150 mg by mouth 2 (two) times daily.    . vitamin C (ASCORBIC ACID) 500 MG tablet Take 500 mg by mouth 3 (three) times daily.    Marland Kitchen zolpidem (AMBIEN) 10 MG tablet take 1 tablet by mouth at bedtime if needed  0  . [DISCONTINUED] promethazine (PHENERGAN) 12.5 MG tablet Take 1 tablet (12.5 mg total) by mouth every 6 (six) hours as needed for nausea or vomiting. (Patient not taking: Reported on 12/25/2015) 15 tablet 0   No current facility-administered medications  on file prior to visit.         Objective:   Physical Exam Blood pressure 130/70, pulse 64, temperature 97.9 F (36.6 C), height 5' 3"  (1.6 m), weight 125 lb (56.7 kg). Alert and oriented. Skin warm and dry. Oral mucosa is moist.   . Sclera anicteric, conjunctivae is pink. Thyroid not enlarged. No cervical lymphadenopathy. Lungs clear. Heart regular rate and rhythm.  Abdomen is soft. Bowel sounds are positive. No hepatomegaly. No abdominal masses felt. No tenderness.  No edema to lower extremities.         Assessment & Plan:  Chronic nausea. Will get a CBC and CMET.

## 2017-09-04 NOTE — Patient Instructions (Addendum)
Labs today.  Miralax daily. 1 scoop. Take one colace daily.

## 2017-09-07 DIAGNOSIS — M5136 Other intervertebral disc degeneration, lumbar region: Secondary | ICD-10-CM | POA: Diagnosis not present

## 2017-09-07 DIAGNOSIS — Z9181 History of falling: Secondary | ICD-10-CM | POA: Diagnosis not present

## 2017-09-07 DIAGNOSIS — S72302D Unspecified fracture of shaft of left femur, subsequent encounter for closed fracture with routine healing: Secondary | ICD-10-CM | POA: Diagnosis not present

## 2017-09-07 DIAGNOSIS — D649 Anemia, unspecified: Secondary | ICD-10-CM | POA: Diagnosis not present

## 2017-09-07 DIAGNOSIS — I2510 Atherosclerotic heart disease of native coronary artery without angina pectoris: Secondary | ICD-10-CM | POA: Diagnosis not present

## 2017-09-07 DIAGNOSIS — K7581 Nonalcoholic steatohepatitis (NASH): Secondary | ICD-10-CM | POA: Diagnosis not present

## 2017-09-07 DIAGNOSIS — I10 Essential (primary) hypertension: Secondary | ICD-10-CM | POA: Diagnosis not present

## 2017-09-07 DIAGNOSIS — E1151 Type 2 diabetes mellitus with diabetic peripheral angiopathy without gangrene: Secondary | ICD-10-CM | POA: Diagnosis not present

## 2017-09-07 DIAGNOSIS — G4733 Obstructive sleep apnea (adult) (pediatric): Secondary | ICD-10-CM | POA: Diagnosis not present

## 2017-09-07 DIAGNOSIS — Z8673 Personal history of transient ischemic attack (TIA), and cerebral infarction without residual deficits: Secondary | ICD-10-CM | POA: Diagnosis not present

## 2017-09-07 DIAGNOSIS — I350 Nonrheumatic aortic (valve) stenosis: Secondary | ICD-10-CM | POA: Diagnosis not present

## 2017-09-09 ENCOUNTER — Telehealth (INDEPENDENT_AMBULATORY_CARE_PROVIDER_SITE_OTHER): Payer: Self-pay | Admitting: Internal Medicine

## 2017-09-09 ENCOUNTER — Other Ambulatory Visit (INDEPENDENT_AMBULATORY_CARE_PROVIDER_SITE_OTHER): Payer: Self-pay | Admitting: *Deleted

## 2017-09-09 DIAGNOSIS — K7469 Other cirrhosis of liver: Secondary | ICD-10-CM

## 2017-09-09 NOTE — Telephone Encounter (Signed)
Patient called, lmoam that she would like Terri to call her in some Zofran to [facility].  [phone]

## 2017-09-09 NOTE — Telephone Encounter (Signed)
Patient was called and informed.

## 2017-09-09 NOTE — Telephone Encounter (Signed)
Pharmacy needs to send a request

## 2017-09-10 ENCOUNTER — Other Ambulatory Visit: Payer: Self-pay

## 2017-09-10 ENCOUNTER — Emergency Department (HOSPITAL_COMMUNITY)
Admission: EM | Admit: 2017-09-10 | Discharge: 2017-09-10 | Disposition: A | Discharge status: Home / Self Care | Payer: Medicare Other | Attending: Emergency Medicine | Admitting: Emergency Medicine

## 2017-09-10 ENCOUNTER — Telehealth (INDEPENDENT_AMBULATORY_CARE_PROVIDER_SITE_OTHER): Payer: Self-pay | Admitting: Internal Medicine

## 2017-09-10 ENCOUNTER — Emergency Department (HOSPITAL_COMMUNITY): Payer: Medicare Other

## 2017-09-10 DIAGNOSIS — S32502A Unspecified fracture of left pubis, initial encounter for closed fracture: Secondary | ICD-10-CM | POA: Diagnosis not present

## 2017-09-10 DIAGNOSIS — Z8673 Personal history of transient ischemic attack (TIA), and cerebral infarction without residual deficits: Secondary | ICD-10-CM | POA: Insufficient documentation

## 2017-09-10 DIAGNOSIS — F17210 Nicotine dependence, cigarettes, uncomplicated: Secondary | ICD-10-CM | POA: Insufficient documentation

## 2017-09-10 DIAGNOSIS — Y929 Unspecified place or not applicable: Secondary | ICD-10-CM | POA: Insufficient documentation

## 2017-09-10 DIAGNOSIS — S3210XA Unspecified fracture of sacrum, initial encounter for closed fracture: Secondary | ICD-10-CM | POA: Insufficient documentation

## 2017-09-10 DIAGNOSIS — I2510 Atherosclerotic heart disease of native coronary artery without angina pectoris: Secondary | ICD-10-CM | POA: Diagnosis not present

## 2017-09-10 DIAGNOSIS — S32402A Unspecified fracture of left acetabulum, initial encounter for closed fracture: Secondary | ICD-10-CM | POA: Diagnosis not present

## 2017-09-10 DIAGNOSIS — S79912A Unspecified injury of left hip, initial encounter: Secondary | ICD-10-CM | POA: Diagnosis present

## 2017-09-10 DIAGNOSIS — I10 Essential (primary) hypertension: Secondary | ICD-10-CM | POA: Insufficient documentation

## 2017-09-10 DIAGNOSIS — S32433A Displaced fracture of anterior column [iliopubic] of unspecified acetabulum, initial encounter for closed fracture: Secondary | ICD-10-CM

## 2017-09-10 DIAGNOSIS — E1151 Type 2 diabetes mellitus with diabetic peripheral angiopathy without gangrene: Secondary | ICD-10-CM | POA: Diagnosis not present

## 2017-09-10 DIAGNOSIS — Z7982 Long term (current) use of aspirin: Secondary | ICD-10-CM | POA: Diagnosis not present

## 2017-09-10 DIAGNOSIS — W010XXA Fall on same level from slipping, tripping and stumbling without subsequent striking against object, initial encounter: Secondary | ICD-10-CM | POA: Diagnosis not present

## 2017-09-10 DIAGNOSIS — Y9389 Activity, other specified: Secondary | ICD-10-CM | POA: Diagnosis not present

## 2017-09-10 DIAGNOSIS — Z9181 History of falling: Secondary | ICD-10-CM | POA: Diagnosis not present

## 2017-09-10 DIAGNOSIS — E119 Type 2 diabetes mellitus without complications: Secondary | ICD-10-CM | POA: Insufficient documentation

## 2017-09-10 DIAGNOSIS — R102 Pelvic and perineal pain: Secondary | ICD-10-CM | POA: Diagnosis not present

## 2017-09-10 DIAGNOSIS — S72302D Unspecified fracture of shaft of left femur, subsequent encounter for closed fracture with routine healing: Secondary | ICD-10-CM | POA: Diagnosis not present

## 2017-09-10 DIAGNOSIS — K7581 Nonalcoholic steatohepatitis (NASH): Secondary | ICD-10-CM | POA: Diagnosis not present

## 2017-09-10 DIAGNOSIS — Y998 Other external cause status: Secondary | ICD-10-CM | POA: Insufficient documentation

## 2017-09-10 DIAGNOSIS — D649 Anemia, unspecified: Secondary | ICD-10-CM | POA: Diagnosis not present

## 2017-09-10 DIAGNOSIS — I350 Nonrheumatic aortic (valve) stenosis: Secondary | ICD-10-CM | POA: Diagnosis not present

## 2017-09-10 DIAGNOSIS — Z79899 Other long term (current) drug therapy: Secondary | ICD-10-CM | POA: Insufficient documentation

## 2017-09-10 DIAGNOSIS — S79919A Unspecified injury of unspecified hip, initial encounter: Secondary | ICD-10-CM | POA: Diagnosis not present

## 2017-09-10 DIAGNOSIS — R52 Pain, unspecified: Secondary | ICD-10-CM | POA: Diagnosis not present

## 2017-09-10 DIAGNOSIS — M5136 Other intervertebral disc degeneration, lumbar region: Secondary | ICD-10-CM | POA: Diagnosis not present

## 2017-09-10 DIAGNOSIS — R110 Nausea: Secondary | ICD-10-CM

## 2017-09-10 DIAGNOSIS — S32512A Fracture of superior rim of left pubis, initial encounter for closed fracture: Secondary | ICD-10-CM | POA: Diagnosis not present

## 2017-09-10 DIAGNOSIS — G4733 Obstructive sleep apnea (adult) (pediatric): Secondary | ICD-10-CM | POA: Diagnosis not present

## 2017-09-10 LAB — CBC WITH DIFFERENTIAL/PLATELET
Basophils Absolute: 0 10*3/uL (ref 0.0–0.1)
Basophils Relative: 0 %
EOS ABS: 0.1 10*3/uL (ref 0.0–0.7)
Eosinophils Relative: 2 %
HCT: 36.7 % (ref 36.0–46.0)
HEMOGLOBIN: 12 g/dL (ref 12.0–15.0)
LYMPHS ABS: 0.8 10*3/uL (ref 0.7–4.0)
Lymphocytes Relative: 12 %
MCH: 31.7 pg (ref 26.0–34.0)
MCHC: 32.7 g/dL (ref 30.0–36.0)
MCV: 96.8 fL (ref 78.0–100.0)
Monocytes Absolute: 0.7 10*3/uL (ref 0.1–1.0)
Monocytes Relative: 10 %
NEUTROS PCT: 76 %
Neutro Abs: 5.2 10*3/uL (ref 1.7–7.7)
Platelets: 113 10*3/uL — ABNORMAL LOW (ref 150–400)
RBC: 3.79 MIL/uL — AB (ref 3.87–5.11)
RDW: 13.2 % (ref 11.5–15.5)
WBC: 6.8 10*3/uL (ref 4.0–10.5)

## 2017-09-10 LAB — BASIC METABOLIC PANEL
Anion gap: 10 (ref 5–15)
BUN: 17 mg/dL (ref 6–20)
CHLORIDE: 107 mmol/L (ref 101–111)
CO2: 25 mmol/L (ref 22–32)
CREATININE: 0.79 mg/dL (ref 0.44–1.00)
Calcium: 8.7 mg/dL — ABNORMAL LOW (ref 8.9–10.3)
Glucose, Bld: 88 mg/dL (ref 65–99)
POTASSIUM: 3.8 mmol/L (ref 3.5–5.1)
SODIUM: 142 mmol/L (ref 135–145)

## 2017-09-10 IMAGING — US US CAROTID DUPLEX BILAT
1 series · 13 of 24 positions shown · non-contrast
Comparison: 04/16/2015

CLINICAL DATA: Bilateral carotid atherosclerosis, hypertension,
hyperlipidemia, diabetes and former smoker.

EXAM:
BILATERAL CAROTID DUPLEX ULTRASOUND
TECHNIQUE: Gray scale imaging, color Doppler and duplex ultrasound were
performed of bilateral carotid and vertebral arteries in the neck.

[Series 1: us carotid duplex bilat · 0.06mm/px · 13 of 100 slices shown]
[im 1/100]
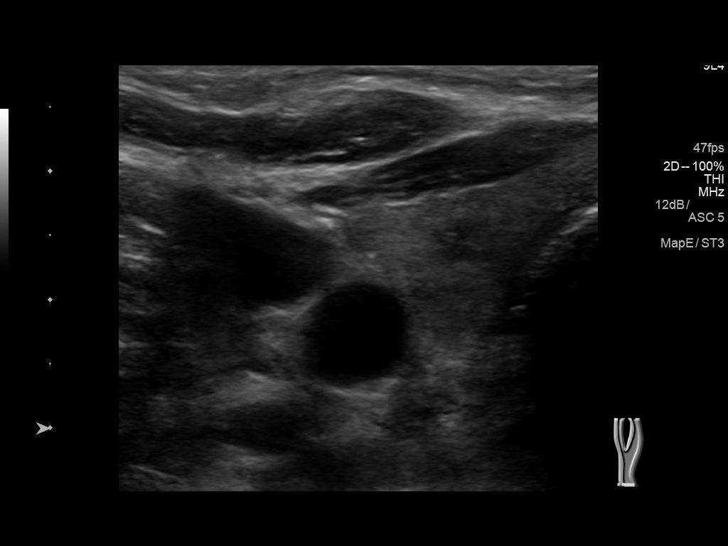
[im 9/100]
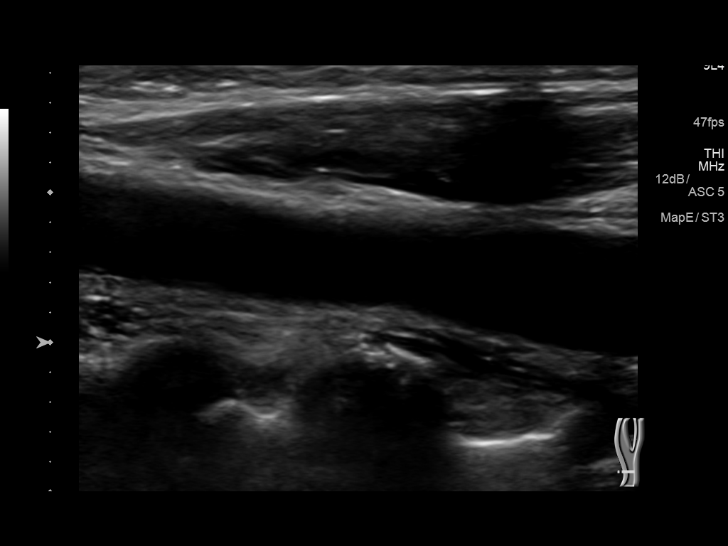
[im 18/100]
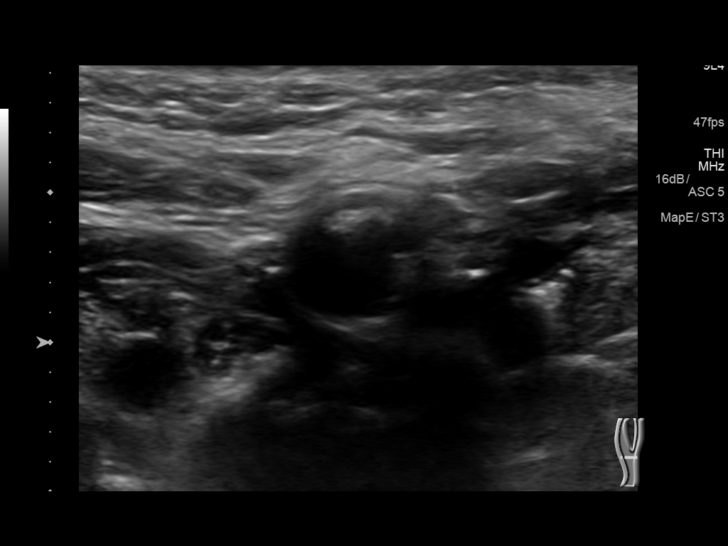
[im 26/100]
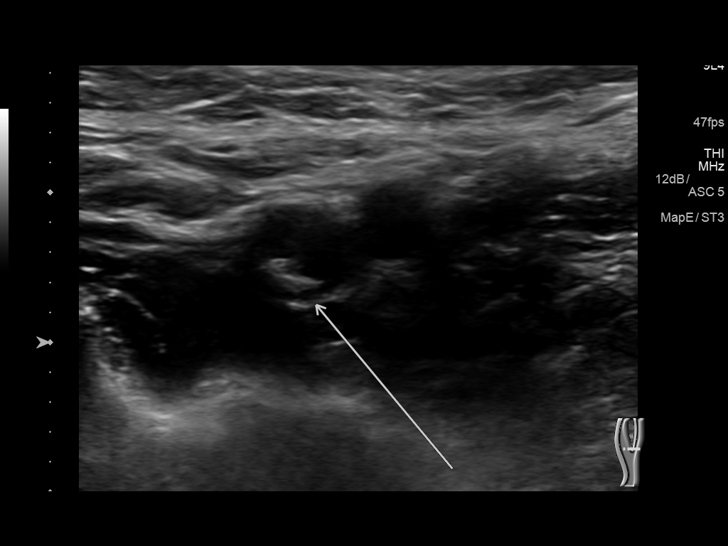
[im 35/100]
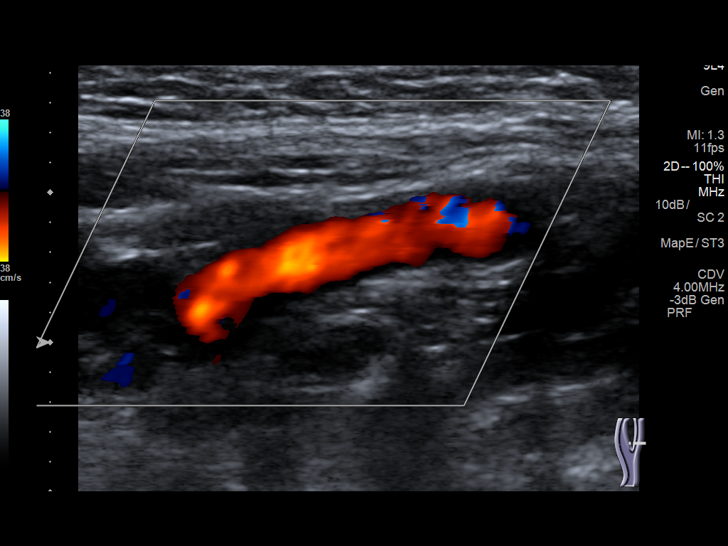
[im 44/100]
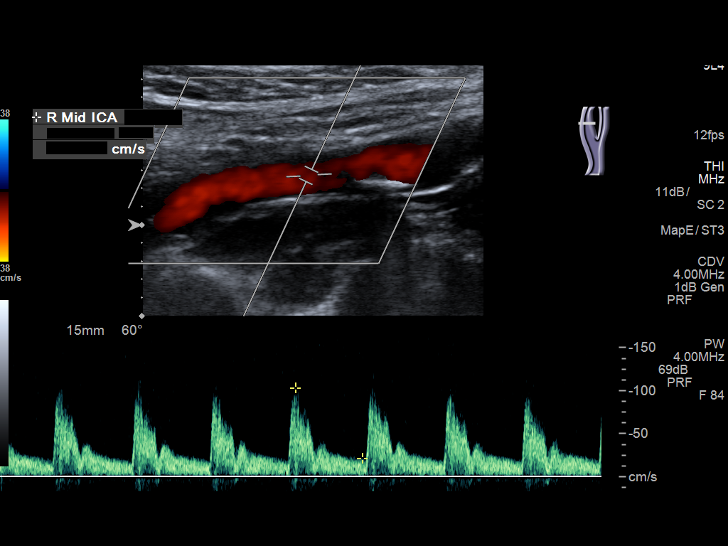
[im 52/100]
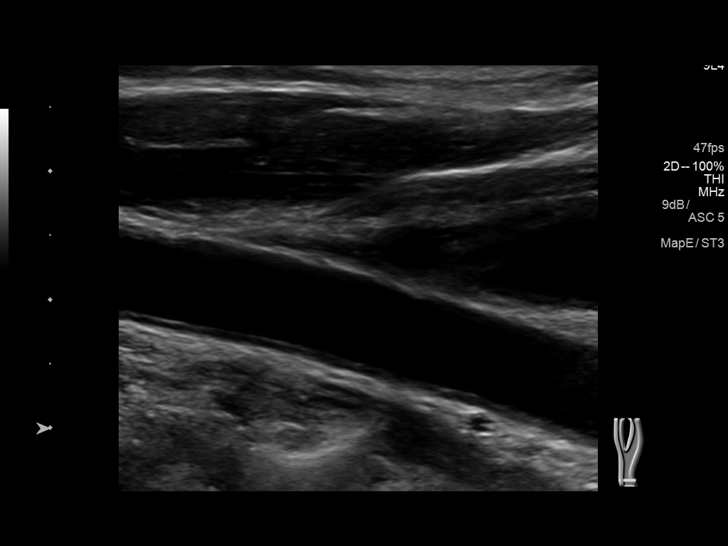
[im 56/100]
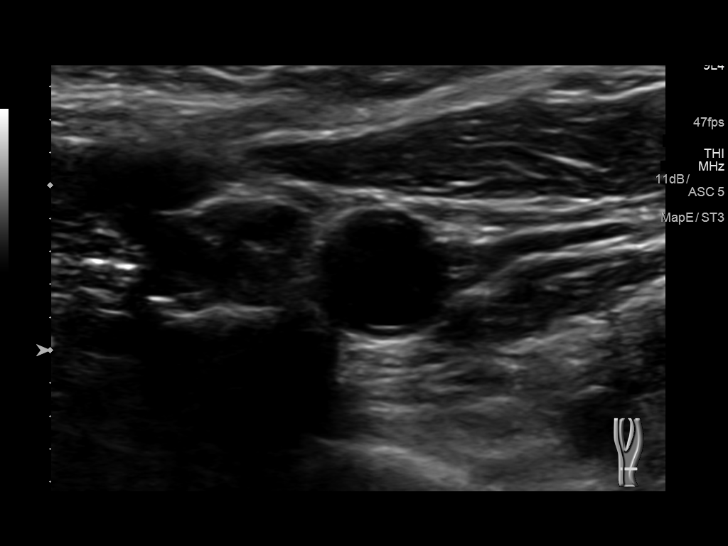
[im 65/100]
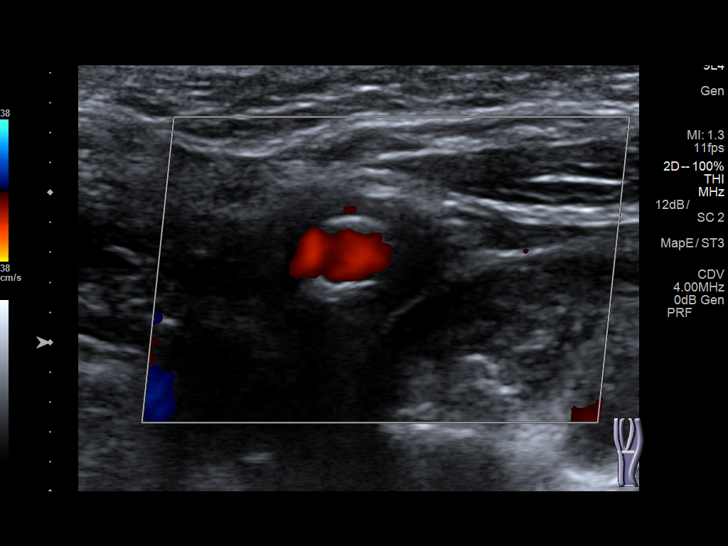
[im 74/100]
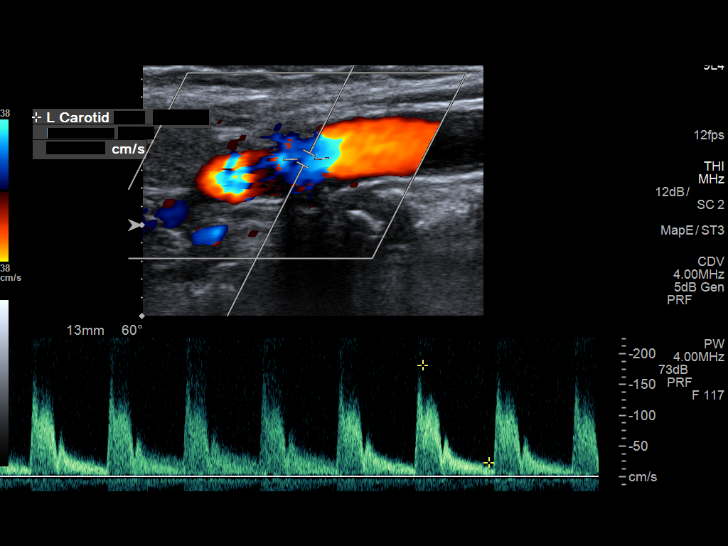
[im 82/100]
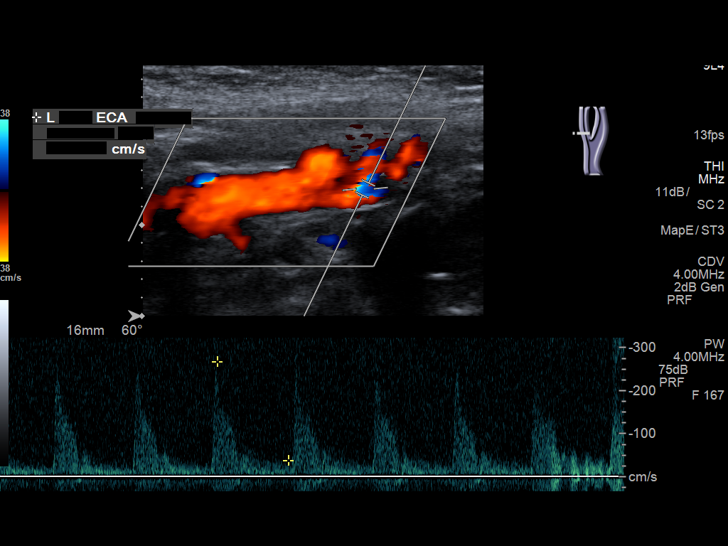
[im 91/100]
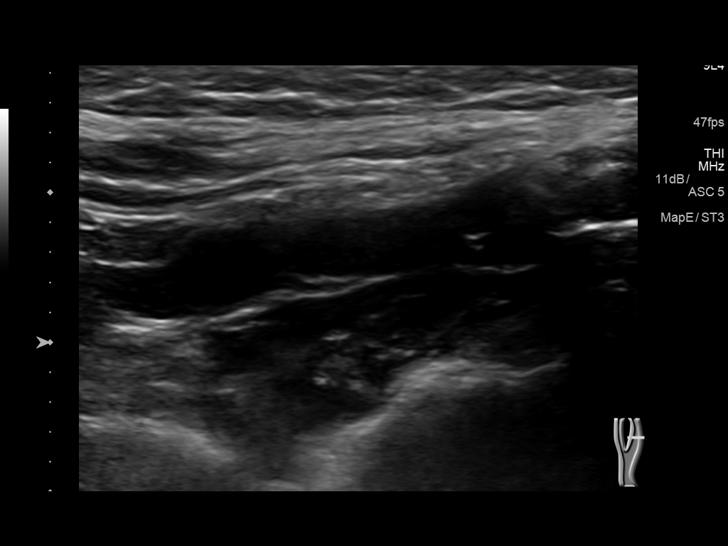
[im 100/100]
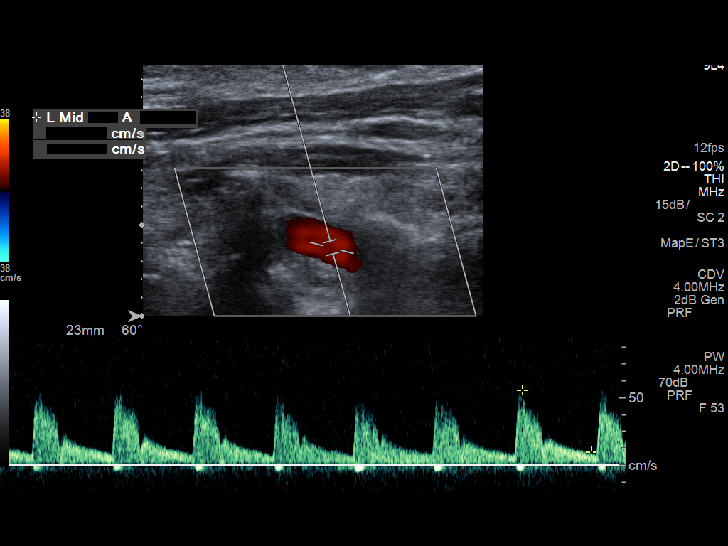

[13 of 24 positions shown; findings below may reference images not displayed]

FINDINGS: Criteria: Quantification of carotid stenosis is based on velocity
parameters that correlate the residual internal carotid diameter
with NASCET-based stenosis levels, using the diameter of the distal
internal carotid lumen as the denominator for stenosis measurement.

The following velocity measurements were obtained:

RIGHT

ICA:  148/26 cm/sec

CCA:  114/17 cm/sec

SYSTOLIC ICA/CCA RATIO:

DIASTOLIC ICA/CCA RATIO:

ECA:  278 cm/sec

LEFT

ICA:  210/31 cm/sec

CCA:  112/16 cm/sec

SYSTOLIC ICA/CCA RATIO:

DIASTOLIC ICA/CCA RATIO:

ECA:  208 cm/sec

RIGHT CAROTID ARTERY: Similar heterogeneous scattered right carotid
system atherosclerosis with scattered calcification. Despite this,
there is no hemodynamically significant right ICA stenosis, velocity
elevation, or turbulent flow. Degree of narrowing remains less than
50%.

RIGHT VERTEBRAL ARTERY:  Antegrade

LEFT CAROTID ARTERY: More pronounced left carotid bifurcation
atherosclerosis with partial calcification and echogenic shadowing.
This narrows the carotid bifurcation by grayscale imaging. Proximal
ICA velocity elevation measures 210/31 cm/sec. By ultrasound
criteria, estimated left ICA stenosis remains 50- 69%.

LEFT VERTEBRAL ARTERY:  Antegrade
IMPRESSION: Left greater the right carotid atherosclerosis.

Moderate left ICA stenosis remains at 50- 69%

Minor right ICA narrowing, less than 50%

Patent antegrade vertebral flow bilaterally

## 2017-09-10 MED ORDER — HYDROCODONE-ACETAMINOPHEN 5-325 MG PO TABS
1.0000 | ORAL_TABLET | Freq: Once | ORAL | Status: AC
  Administered 2017-09-10: 1 via ORAL
  Filled 2017-09-10: qty 1

## 2017-09-10 MED ORDER — FENTANYL CITRATE (PF) 100 MCG/2ML IJ SOLN
100.0000 ug | Freq: Once | INTRAMUSCULAR | Status: AC
  Administered 2017-09-10: 100 ug via INTRAVENOUS
  Filled 2017-09-10: qty 2

## 2017-09-10 MED ORDER — HYDROCODONE-ACETAMINOPHEN 5-325 MG PO TABS: 1.0000 | ORAL_TABLET | Freq: Four times a day (QID) | ORAL | 0 refills | Status: DC | PRN

## 2017-09-10 MED ORDER — ONDANSETRON HCL 4 MG PO TABS: 4.0000 mg | ORAL_TABLET | Freq: Three times a day (TID) | ORAL | 2 refills | Status: DC | PRN

## 2017-09-10 NOTE — ED Notes (Signed)
Phlebotomist paged to draw blood

## 2017-09-10 NOTE — ED Triage Notes (Addendum)
Pt fell this morning after reaching for her wheelchair. Pt is complaining of pain in her left hip. Went to see her PT and they stated that she did not fracture her hip, but just bruised. Pt does have some slight shortening with her left leg, which isn't normal. Pt was able to walk today, just with intense pain  VSS Pain 9/10

## 2017-09-10 NOTE — Telephone Encounter (Signed)
err

## 2017-09-10 NOTE — ED Notes (Signed)
Patient transported to X-ray 

## 2017-09-10 NOTE — ED Notes (Signed)
Pt took a Tramadol 2 hours ago with no relief

## 2017-09-10 NOTE — Discharge Instructions (Signed)
No weightbearing allowed on your left leg until okayed by Dr. [name].  Stay in your wheelchair.  CAll Dr. Sid Falcon office tomorrow to arrange to be seen in 2 weeks.  Take the pain medicine prescribed as needed for bad pain or Tylenol for mild pain.  Do not take the pain medicine prescribed together with Tylenol as the combination can be dangerous to your liver.  Make sure that you use a mild laxative such as Senokot to prevent constipation while taking the pain medicine prescribed

## 2017-09-10 NOTE — ED Provider Notes (Addendum)
[facility] EMERGENCY DEPARTMENT Provider Note   CSN: [id number] Arrival date & time: 09/10/17  1702     History   Chief Complaint Chief Complaint  Patient presents with  . Fall    HPI Adriann Thau is a 77 y.o. female.  Patient felt well until she tripped and fell approximately 9 AM today while reaching for her wheelchair which she uses intermittently.  She complains of pain at the left para coccygeal area and at left hip.  She has been ambulatory since the fall.  Pain is worse with walking or moving her left leg and improved with remaining still.  No other injury.  No treatment prior to coming here  HPI  Past Medical History:  Diagnosis Date  . Anemia   . Aortic insufficiency    Moderate  . Back pain   . Carotid stenosis, bilateral   . Cirrhosis ([place])   . Closed left femoral fracture ([place]) 04/21/2017  . Coronary artery disease    Stent x 2 RCA 1995, cardiac catheterization 09/2016 showing only mild atherosclerosis  . DDD (degenerative disc disease), lumbar   . Essential hypertension   . Falls   . GERD (gastroesophageal reflux disease)   . Hiatal hernia   . History of stroke   . Hypercholesteremia   . IBS (irritable bowel syndrome)   . Non-alcoholic fatty liver disease   . OSA (obstructive sleep apnea)    no CPAP  . Pericardial effusion    a. small by echo 2018.  Marland Kitchen PSVT (paroxysmal supraventricular tachycardia) ([place])   . Recurrent UTI   . Seizures ([place])   . Stroke ([place])   . Type 2 diabetes mellitus ([place])    off medication  . Varices, esophageal [facility])     Patient Active Problem List   Diagnosis Date Noted  . Syncope and collapse   . Syncope 08/24/2017  . Closed left femoral fracture ([place]) 04/21/2017  . Seizure disorder ([place]) 04/21/2017  . Closed pertrochanteric fracture of femur, left, initial encounter ([place]) 03/02/2017  . Seizure ([place]) 02/28/2017  . Hip fracture ([place]) 02/28/2017  . Chest pain 12/07/2016  . Atypical chest pain 12/07/2016  . Tachycardia     . NSTEMI (non-ST elevated myocardial infarction) ([place]) 11/02/2016  . Multifocal atrial tachycardia ([place]) 09/25/2016  . Elevated troponin 09/24/2016  . Hypokalemia 09/23/2016  . TIA (transient ischemic attack) 05/05/2016  . Intractable nausea and vomiting 10/15/2015  . Acute coronary syndrome ([place]) 06/01/2015  . Bronchitis 06/01/2015  . Thrombocytopenia ([place]) 06/01/2015  . Cerebral infarction ([place]) 03/06/2014  . Lower urinary tract infectious disease   . PSVT (paroxysmal supraventricular tachycardia) ([place]) 12/09/2013  . Esophageal varices ([place]) 03/29/2013  . Hematemesis 03/29/2013  . Hepatic cirrhosis ([place]) 03/29/2013  . Presence of stent in right coronary artery 07/04/2011  . OSA (obstructive sleep apnea) 07/04/2011  . Aortic sclerosis 07/04/2011  . Aortic insufficiency 07/04/2011  . HTN (hypertension) 07/04/2011  . Carotid stenosis, bilateral 07/04/2011  . Chest pain at rest 07/03/2011  . CAD (coronary artery disease) 07/03/2011  . DM type 2 (diabetes mellitus, type 2) ([place]) 07/03/2011  . Hypercholesteremia 07/03/2011  . GERD (gastroesophageal reflux disease) 07/03/2011    Past Surgical History:  Procedure Laterality Date  . [place]  . APPENDECTOMY    . BACK SURGERY  1985  . CARDIAC CATHETERIZATION  [phone]   Stent to the proximal RCA after MI   . CARDIAC CATHETERIZATION N/A 09/26/2016   Procedure: Left Heart Cath and  Coronary Angiography;  Surgeon: Leonie Man, MD;  Location: [facility];  Service: Cardiovascular;  Laterality: N/A;  . CATARACT EXTRACTION W/PHACO Right 07/04/2014   Procedure: CATARACT EXTRACTION PHACO AND INTRAOCULAR LENS PLACEMENT (IOC);  Surgeon: Elta Guadeloupe T. Gershon Crane, MD;  Location: [facility];  Service: Ophthalmology;  Laterality: Right;  CDE:13.13  . COLONOSCOPY    . DILATION AND CURETTAGE OF UTERUS     x2  . Epi Retinal Membrane Peel Left   . ERCP    . ESOPHAGEAL BANDING N/A 04/01/2013   Procedure: ESOPHAGEAL BANDING;   Surgeon: Rogene Houston, MD;  Location: [facility];  Service: Endoscopy;  Laterality: N/A;  . ESOPHAGEAL BANDING N/A 05/24/2013   Procedure: ESOPHAGEAL BANDING;  Surgeon: Rogene Houston, MD;  [facility];  Service: Endoscopy;  Laterality: N/A;  . ESOPHAGEAL BANDING N/A 06/21/2014   Procedure: ESOPHAGEAL BANDING;  Surgeon: Rogene Houston, MD;  [facility];  Service: Endoscopy;  Laterality: N/A;  . ESOPHAGOGASTRODUODENOSCOPY N/A 04/01/2013   Procedure: ESOPHAGOGASTRODUODENOSCOPY (EGD);  Surgeon: Rogene Houston, MD;  [facility];  Service: Endoscopy;  Laterality: N/A;  230-rescheduled to 8:30am Ann notified pt  . ESOPHAGOGASTRODUODENOSCOPY N/A 05/24/2013   Procedure: ESOPHAGOGASTRODUODENOSCOPY (EGD);  Surgeon: Rogene Houston, MD;  [facility];  Service: Endoscopy;  Laterality: N/A;  730  . ESOPHAGOGASTRODUODENOSCOPY N/A 06/21/2014   Procedure: ESOPHAGOGASTRODUODENOSCOPY (EGD);  Surgeon: Rogene Houston, MD;  [facility];  Service: Endoscopy;  Laterality: N/A;  930-rescheduled 10/14 @ 1200 Ann to notify pt  . EYE SURGERY  08   cataract surgery of the left eye  . FEMUR IM NAIL Left 03/02/2017   Procedure: INTRAMEDULLARY (IM) NAIL FEMORAL;  Surgeon: Rod Can, MD;  Location: [facility];  Service: Orthopedics;  Laterality: Left;  . HARDWARE REMOVAL Right 01/17/2013   Procedure: REMOVAL OF HARDWARE AND EXCISION ULNAR STYLOID RIGHT WRIST;  Surgeon: Tennis Must, MD;  Location: [facility];  Service: Orthopedics;  Laterality: Right;  . MYRINGOTOMY  2012   both ears  . ORIF FEMUR FRACTURE Left 04/23/2017   Procedure: OPEN REDUCTION INTERNAL FIXATION (ORIF) DISTAL FEMUR FRACTURE (FRACTURE AROUND FEMORAL NAIL);  Surgeon: Altamese Jeff, MD;  Location: [facility];  Service: Orthopedics;  Laterality: Left;  . TONSILLECTOMY    . VAGINAL HYSTERECTOMY  1972  . WRIST SURGERY     rt wrist hardwear removal    OB History    No data  available       Home Medications    Prior to Admission medications   Medication Sig Start Date End Date Taking? Authorizing Provider  aspirin EC 81 MG tablet Take 81 mg by mouth daily.    Jake Samples, PA-C  atorvastatin (LIPITOR) 40 MG tablet Take 40 mg by mouth daily.    Jake Samples, PA-C  carvedilol (COREG) 12.5 MG tablet Take 1 tablet (12.5 mg total) by mouth 2 (two) times daily with a meal. 08/26/17   Tat, Shanon Brow, MD  diltiazem (CARDIZEM CD) 120 MG 24 hr capsule Take 120 mg by mouth daily.    Jake Samples, PA-C  docusate sodium (COLACE) 100 MG capsule Take 100 mg by mouth 2 (two) times daily.    Redmond School, MD  furosemide (LASIX) 20 MG tablet Take 20 mg by mouth daily.    [provider]  ibuprofen (ADVIL) 200 MG tablet Take 200 mg by mouth every 6 (six) hours as needed  for headache or moderate pain.     [provider]  isosorbide mononitrate (IMDUR) 30 MG 24 hr tablet Take 30 mg by mouth daily.     Delman Cheadle J, PA-C  lisinopril (PRINIVIL,ZESTRIL) 20 MG tablet Take 20 mg by mouth daily.    Delman Cheadle J, PA-C  ondansetron (ZOFRAN) 4 MG tablet Take 1 tablet (4 mg total) by mouth every 8 (eight) hours as needed for nausea or vomiting. 09/10/17   Setzer, Rona Ravens, NP  pantoprazole (PROTONIX) 40 MG tablet Take 40 mg by mouth daily.    [provider]  phenytoin (DILANTIN) 200 MG ER capsule Take 1 capsule (200 mg total) by mouth daily. Patient taking differently: Take 100 mg by mouth daily.  08/27/17   Orson Eva, MD  potassium chloride SA (K-DUR,KLOR-CON) 20 MEQ tablet Take 1 tablet (20 mEq total) by mouth daily. 12/09/16   Lendon Colonel, NP  ranitidine (ZANTAC) 150 MG tablet Take 150 mg by mouth 2 (two) times daily.    [provider]  vitamin C (ASCORBIC ACID) 500 MG tablet Take 500 mg by mouth 3 (three) times daily.    [provider]  zolpidem (AMBIEN) 10 MG tablet take 1 tablet by mouth at bedtime if  needed 05/01/17   [provider]    Family History Family History  Problem Relation Age of Onset  . Diabetes Mother   . Heart failure Father   . Heart failure Maternal Aunt     Social History Social History   Tobacco Use  . Smoking status: Current Some Day Smoker    Packs/day: 0.25    Years: 50.00    Pack years: 12.50    Types: Cigarettes    Last attempt to quit: 01/13/2012    Years since quitting: 5.6  . Smokeless tobacco: Never Used  Substance Use Topics  . Alcohol use: No    Alcohol/week: 0.0 oz  . Drug use: No     Allergies   Tape   Review of Systems Review of Systems  Constitutional: Negative.   HENT: Negative.   Respiratory: Negative.   Cardiovascular: Negative.   Gastrointestinal: Negative.   Musculoskeletal: Positive for arthralgias.       Left hip pain, peri-coccygeal pain  Skin: Negative.   Neurological: Negative.   Psychiatric/Behavioral: Negative.   All other systems reviewed and are negative.    Physical Exam Updated Vital Signs BP (!) 176/58 (BP Location: Right Arm)   Pulse 88   Temp 98.8 F (37.1 C) (Oral)   Resp 16   Ht 5' 3"  (1.6 m)   Wt 56.7 kg (125 lb)   SpO2 98%   BMI 22.14 kg/m   Physical Exam  Constitutional: She appears well-developed and well-nourished.  HENT:  Head: Normocephalic and atraumatic.  Eyes: Conjunctivae are normal. Pupils are equal, round, and reactive to light.  Neck: Neck supple. No tracheal deviation present. No thyromegaly present.  Cardiovascular: Normal rate and regular rhythm.  No murmur heard. Pulmonary/Chest: Effort normal and breath sounds normal.  Abdominal: Soft. Bowel sounds are normal. She exhibits no distension. There is no tenderness.  Musculoskeletal: Normal range of motion. She exhibits no edema or tenderness.  Entire spine is nontender.  She has pain at left hip on passive motion.  DP pulses 2+.  Femoral pulses 2+ bilaterally.  Her left lower extremity does not appear particularly  shortened.  It is not externally rotated or deformed.  All other extremities without contusion abrasion  or tenderness neurovascularly intact  Neurological: She is alert. Coordination normal.  Skin: Skin is warm and dry. No rash noted.  Psychiatric: She has a normal mood and affect.  Nursing note and vitals reviewed.    ED Treatments / Results  Labs (all labs ordered are listed, but only abnormal results are displayed) Labs Reviewed  BASIC METABOLIC PANEL  CBC WITH DIFFERENTIAL/PLATELET    EKG  EKG Interpretation None       Radiology No results found.  Procedures Procedures (including critical care time)  Medications Ordered in ED Medications  fentaNYL (SUBLIMAZE) injection 100 mcg (not administered)   Results for orders placed or performed during the hospital encounter of 83/41/96  Basic metabolic panel  Result Value Ref Range   Sodium 142 135 - 145 mmol/L   Potassium 3.8 3.5 - 5.1 mmol/L   Chloride 107 101 - 111 mmol/L   CO2 25 22 - 32 mmol/L   Glucose, Bld 88 65 - 99 mg/dL   BUN 17 6 - 20 mg/dL   Creatinine, Ser 0.79 0.44 - 1.00 mg/dL   Calcium 8.7 (L) 8.9 - 10.3 mg/dL   GFR calc non Af Amer >60 >60 mL/min   GFR calc Af Amer >60 >60 mL/min   Anion gap 10 5 - 15  CBC with Differential/Platelet  Result Value Ref Range   WBC 6.8 4.0 - 10.5 K/uL   RBC 3.79 (L) 3.87 - 5.11 MIL/uL   Hemoglobin 12.0 12.0 - 15.0 g/dL   HCT 36.7 36.0 - 46.0 %   MCV 96.8 78.0 - 100.0 fL   MCH 31.7 26.0 - 34.0 pg   MCHC 32.7 30.0 - 36.0 g/dL   RDW 13.2 11.5 - 15.5 %   Platelets 113 (L) 150 - 400 K/uL   Neutrophils Relative % 76 %   Neutro Abs 5.2 1.7 - 7.7 K/uL   Lymphocytes Relative 12 %   Lymphs Abs 0.8 0.7 - 4.0 K/uL   Monocytes Relative 10 %   Monocytes Absolute 0.7 0.1 - 1.0 K/uL   Eosinophils Relative 2 %   Eosinophils Absolute 0.1 0.0 - 0.7 K/uL   Basophils Relative 0 %   Basophils Absolute 0.0 0.0 - 0.1 K/uL   Dg Chest 2 View  Result Date: 08/24/2017 CLINICAL  DATA:  Syncopal episode today. EXAM: CHEST  2 VIEW COMPARISON:  04/21/2017 and 02/26/2017 FINDINGS: Minimal blunting at the left costophrenic angle may be new. Otherwise, the lungs are clear. No pulmonary edema. Heart and mediastinum are within normal limits. Bridging osteophytes in the thoracic spine. IMPRESSION: Minimal blunting at the left costophrenic angle. Findings could represent mild atelectasis or tiny left pleural effusion. Otherwise, normal chest examination. Electronically Signed   By: Markus Daft M.D.   On: 08/24/2017 15:07   Ct Head Wo Contrast  Result Date: 08/24/2017 CLINICAL DATA:  Altered level of consciousness. EXAM: CT HEAD WITHOUT CONTRAST TECHNIQUE: Contiguous axial images were obtained from the base of the skull through the vertex without intravenous contrast. COMPARISON:  CT scan of February 28, 2017. FINDINGS: Brain: Mild diffuse cortical atrophy is noted. Old right frontal infarction is noted. No mass effect or midline shift is noted. Ventricular size is within normal limits. There is no evidence of mass lesion, hemorrhage or acute infarction. Vascular: No hyperdense vessel or unexpected calcification. Skull: Normal. Negative for fracture or focal lesion. Sinuses/Orbits: No acute finding. Other: None. IMPRESSION: Mild diffuse cortical atrophy. Old right frontal infarction. No acute intracranial abnormality seen. Electronically Signed  By: Marijo Conception, M.D.   On: 08/24/2017 15:34   Ct Pelvis Wo Contrast  Result Date: 09/10/2017 CLINICAL DATA:  Pain after fall EXAM: CT PELVIS WITHOUT CONTRAST TECHNIQUE: Multidetector CT imaging of the pelvis was performed following the standard protocol without intravenous contrast. COMPARISON:  Left hip x-rays September 10, 2017 and pelvis films March 02, 2017. FINDINGS: Urinary Tract:  The bladder and distal ureters are normal. Bowel:  Fecal loading in the colon. Vascular/Lymphatic: Atherosclerotic changes in the distal abdominal aorta, iliac vessels,  and femoral vessels. No adenopathy. Reproductive:  Status post hysterectomy. Other: There is a small amount of ascites in the pelvis which may be posttraumatic given fractures described below. Musculoskeletal: The right hip, right acetabulum, and right iliac bone are normal. Pars defects in the lower lumbar spine are chronic in appearance. Grade 2 anterolisthesis of L5 versus S1 is identified. This is probably due to the pars defects and is likely chronic. No acute fractures in the lumbar spine. There is a fracture through the left side of the sacrum. The left iliac crest is normal in appearance. There is a fracture through the left acetabulum which is seen anteriorly on axial image 61. There is a displaced fracture through the left inferior pubic ramus. There is a subtle fracture through the left superior pubic ramus at extending into the pubic symphysis as seen on coronal image 31. No widening of the pubic symphysis. It is difficult to assess the acuity of the proximal left femoral fractures. The patient had an intertrochanteric fracture which occurred in June of 2018. A gamma nail and rod extend across these fractures. Several lucencies remain. I suspect most of the fractures in the proximal left femur are chronic but it would be difficult to exclude an acute on chronic fracture. However, the hardware remains intact. No dislocation. IMPRESSION: 1. There is a fracture extending from superior to inferior and anterior posterior through the left side of the sacrum. There is a fracture through the anterior left acetabulum in addition to fractures in the left inferior pubic ramus and left superior pubic ramus, extending slightly into the pubic symphysis which is not widened. The fractures through the proximal femur are are favored to be primarily old. The hardware remains intact. It would be difficult to completely exclude an acute on chronic fracture in the proximal left femur. Electronically Signed   By: Dorise Bullion III M.D   On: 09/10/2017 20:15   Dg Hip Unilat With Pelvis 2-3 Views Left  Result Date: 09/10/2017 CLINICAL DATA:  Patient fell this morning, landing on the left side. Now with left-sided hip pain in the coccyx area. Ambulating with pain. EXAM: DG HIP (WITH OR WITHOUT PELVIS) 2-3V LEFT COMPARISON:  04/21/2017 FINDINGS: Postoperative internal fixation of the previous inter trochanteric fracture of the left proximal femur. There is a long stent intramedullary rod with compression bolt and plate and screw with cerclage wires along the shaft. Additional oblique fracture along the mid femoral shaft, incompletely included. The left femoral fractures appear unchanged since the previous study. There does appear to be a new fracture of the superior and inferior left pubic rami, not definitely present on the prior study. Right hemipelvis and right hip per grossly intact. SI joints and symphysis pubis are not displaced. Prominent vascular calcifications. IMPRESSION: 1. New acute fractures of the left superior and inferior pubic rami. 2. Old inter trochanteric and midshaft fractures of the left femur post internal fixation. Electronically Signed   By: Gwyndolyn Saxon  Gerilyn Nestle M.D.   On: 09/10/2017 18:21     Initial Impression / Assessment and Plan / ED Course  I have reviewed the triage vital signs and the nursing notes.  Pertinent labs & imaging results that were available during my care of the patient were reviewed by me and considered in my medical decision making (see chart for details).    10 PM patient resting comfortably after treatment with fentanyl requesting additional pain medicine.  Pain mild presently.  Norco ordered  Discussed case with Dr. Alvan Dame from [place].  He suggests nonweightbearing for 2 weeks and follow-up with Dr.Swintek the office in 2 weeks.  Patient and family report that she has adequate support at home and does not require hospitalization.  Plan prescription Norco  patient encouraged to use Senokot to prevent constipation [place] Controlled Substance reporting System queried Final Clinical Impressions(s) / ED Diagnoses   #1 fall #2 closed fracture of the left acetabulum , left pubic rami and sacrum Final diagnoses:  None    ED Discharge Orders    None       Orlie Dakin, MD 09/10/17 2125    Orlie Dakin, MD 09/10/17 2127

## 2017-09-11 DIAGNOSIS — I69364 Other paralytic syndrome following cerebral infarction affecting left non-dominant side: Secondary | ICD-10-CM | POA: Diagnosis not present

## 2017-09-11 DIAGNOSIS — Z7409 Other reduced mobility: Secondary | ICD-10-CM | POA: Diagnosis not present

## 2017-09-14 DIAGNOSIS — I2510 Atherosclerotic heart disease of native coronary artery without angina pectoris: Secondary | ICD-10-CM | POA: Diagnosis not present

## 2017-09-14 DIAGNOSIS — K7581 Nonalcoholic steatohepatitis (NASH): Secondary | ICD-10-CM | POA: Diagnosis not present

## 2017-09-14 DIAGNOSIS — M5136 Other intervertebral disc degeneration, lumbar region: Secondary | ICD-10-CM | POA: Diagnosis not present

## 2017-09-14 DIAGNOSIS — I350 Nonrheumatic aortic (valve) stenosis: Secondary | ICD-10-CM | POA: Diagnosis not present

## 2017-09-14 DIAGNOSIS — I10 Essential (primary) hypertension: Secondary | ICD-10-CM | POA: Diagnosis not present

## 2017-09-14 DIAGNOSIS — Z8673 Personal history of transient ischemic attack (TIA), and cerebral infarction without residual deficits: Secondary | ICD-10-CM | POA: Diagnosis not present

## 2017-09-14 DIAGNOSIS — Z9181 History of falling: Secondary | ICD-10-CM | POA: Diagnosis not present

## 2017-09-14 DIAGNOSIS — E1151 Type 2 diabetes mellitus with diabetic peripheral angiopathy without gangrene: Secondary | ICD-10-CM | POA: Diagnosis not present

## 2017-09-14 DIAGNOSIS — G4733 Obstructive sleep apnea (adult) (pediatric): Secondary | ICD-10-CM | POA: Diagnosis not present

## 2017-09-14 DIAGNOSIS — D649 Anemia, unspecified: Secondary | ICD-10-CM | POA: Diagnosis not present

## 2017-09-14 DIAGNOSIS — S72302D Unspecified fracture of shaft of left femur, subsequent encounter for closed fracture with routine healing: Secondary | ICD-10-CM | POA: Diagnosis not present

## 2017-09-15 ENCOUNTER — Other Ambulatory Visit: Payer: Self-pay

## 2017-09-15 ENCOUNTER — Telehealth: Payer: Self-pay | Admitting: Orthopedic Surgery

## 2017-09-15 ENCOUNTER — Ambulatory Visit: Payer: Self-pay | Admitting: Orthopedic Surgery

## 2017-09-15 ENCOUNTER — Other Ambulatory Visit: Payer: Self-pay | Admitting: *Deleted

## 2017-09-15 NOTE — Patient Outreach (Signed)
[name]) Care Management   09/15/2017  [name] 1941-01-16 [id number]  [name] is an 77 y.o. female with medical history significant of anemia, aortic insufficiency, back pain carotid artery stenosis, liver cirrhosis due to NASH, esophageal varices, CAD, lumbar DDD, essential hypertension, GERD/hiatal hernia, history of CVA, hyperlipidemia, obstructive sleep apnea, PSVT, recurring UTIs, type 2 diabetes,and falls in June and August 2018 resulting in a Closed left femur fracture  [name] was referred to [place] for transition of care calls by [name], [facility] SW after she was discharged from [facility] on 06/22/17 -(needRN CM follow upfor Transitions of Care)  Transition of care services was completed. She was being followed for [facility] CM community complex services until she had 1 further admission (08/24/17 to 08/26/17 for syncope and dilantin toxicity)  Recently she had an ED visit on 09/10/17 after a fall in which she was dx with a closed fracture of the left acetabulum, left pubic rami and sacrum.  She was placed on bedrest for 2 weeks, given medicine for nausea, and pain. She was discharged home with her family and encouraged for follow up with [name] in 2 weeks.   Subjective:  See notes below  Objective:   BP (!) 172/80   Pulse 85   Temp 98.5 F (36.9 C) (Oral)   Resp 20   SpO2 97%  Review of Systems  Constitutional: Negative for chills, diaphoresis, fever, malaise/fatigue and weight loss.  HENT: Positive for congestion. Negative for ear discharge, ear pain, hearing loss, nosebleeds, sinus pain, sore throat and tinnitus.   Eyes: Negative.  Negative for blurred vision, double vision, photophobia, pain, discharge and redness.  Respiratory: Negative.  Negative for cough, hemoptysis, sputum production, shortness of breath, wheezing and stridor.   Gastrointestinal: Positive for constipation and nausea. Negative for vomiting.  Musculoskeletal:  Positive for back pain, falls and joint pain.  Skin: Negative.  Negative for itching and rash.  Neurological: Positive for weakness.  Endo/Heme/Allergies: Bruises/bleeds easily.    Physical Exam  Constitutional: She is oriented to person, place, and time. She appears well-developed and well-nourished.  HENT:  Head: Normocephalic and atraumatic.  Eyes: Conjunctivae are normal. Pupils are equal, [name], and reactive to light.  Neck: Normal range of motion. Neck supple.  Cardiovascular: Normal rate and regular rhythm.  Respiratory: Effort normal and breath sounds normal.  GI: Soft. Bowel sounds are normal.  Musculoskeletal: She exhibits tenderness.       Left hip: She exhibits decreased range of motion and tenderness.  Neurological: She is alert and oriented to person, place, and time.  Skin: Skin is warm and dry.  Psychiatric: She has a normal mood and affect. Her behavior is normal. Judgment and thought content normal.    Encounter Medications:   Outpatient Encounter Medications as of 09/15/2017  Medication Sig  . aspirin EC 81 MG tablet Take 81 mg by mouth daily.  Marland Kitchen atorvastatin (LIPITOR) 40 MG tablet Take 40 mg by mouth daily.  . carvedilol (COREG) 12.5 MG tablet Take 1 tablet (12.5 mg total) by mouth 2 (two) times daily with a meal.  . diltiazem (CARDIZEM CD) 120 MG 24 hr capsule Take 120 mg by mouth daily.  . DiphenhydrAMINE HCl, Sleep, (SLEEP AID) 25 MG CAPS Take 1-2 capsules by mouth at bedtime as needed (for sleep).  . docusate sodium (COLACE) 100 MG capsule Take 100 mg by mouth daily.   . furosemide (LASIX) 20 MG tablet Take 20 mg by  mouth daily.  Marland Kitchen HYDROcodone-acetaminophen (NORCO) 5-325 MG tablet Take 1 tablet by mouth every 6 (six) hours as needed for moderate pain or severe pain.  . isosorbide mononitrate (IMDUR) 30 MG 24 hr tablet Take 30 mg by mouth daily.   Marland Kitchen lisinopril (PRINIVIL,ZESTRIL) 20 MG tablet Take 20 mg by mouth daily.  . ondansetron (ZOFRAN) 4 MG tablet Take 1  tablet (4 mg total) by mouth every 8 (eight) hours as needed for nausea or vomiting.  . pantoprazole (PROTONIX) 40 MG tablet Take 40 mg by mouth daily.  . phenytoin (DILANTIN) 200 MG ER capsule Take 1 capsule (200 mg total) by mouth daily.  . ranitidine (ZANTAC) 150 MG tablet Take 150 mg by mouth 2 (two) times daily.  . traMADol (ULTRAM) 50 MG tablet Take 50 mg by mouth 4 (four) times daily. 10 day course starting on 09/10/17  . zolpidem (AMBIEN) 10 MG tablet take 1 tablet by mouth at bedtime if needed   No facility-administered encounter medications on file as of 09/15/2017.     Functional Status:   In your present state of health, do you have any difficulty performing the following activities: 08/24/2017 07/06/2017  Hearing? N N  Vision? N N  Difficulty concentrating or making decisions? N N  Walking or climbing stairs? Y Y  Dressing or bathing? N Y  Doing errands, shopping? N Y  Conservation officer, nature and eating ? - N  Using the Toilet? - N  In the past six months, have you accidently leaked urine? - N  Do you have problems with loss of bowel control? - N  Managing your Medications? - N  Managing your Finances? - N  Housekeeping or managing your Housekeeping? - N  Some recent data might be hidden    Fall/Depression Screening:    Fall Risk  07/06/2017 03/09/2017  Falls in the past year? Yes Yes  Number falls in past yr: 2 or more 1  Injury with Fall? Yes Yes  Risk Factor Category  High Fall Risk -  Risk for fall due to : History of fall(s) Impaired balance/gait;Impaired mobility  Risk for fall due to: Comment history of one fall in 2018 -  Follow up - Falls prevention discussed   PHQ 2/9 Scores 07/06/2017 03/09/2017  PHQ - 2 Score 0 0    Assessment:    This Cm met with [name] at her home today in her bedroom. She is lying in her bed on bedrest. Support today, 09/15/17 includes her grandson in law, son and daughter in law  On 09/11/17  CM received contact from her grand daughter,  [name] that was left on 09/10/17 evening to updated CM on [facility] 09/10/17 ED visit after a fall with injury to her hip/pelvis.  CM contacted SW on 09/11/17 to assist with the request from the grand daughter and family for possible assistance in the home to care for Barbara Macon. Cm left a message at the Primary care provider office to request assist with home health additional services for home health RN and aide on 09/11/16  Constipation - Barbara Hassing reports hard stools, nausea related to use hydrocodone for pain. She reports she is taking Miralax for constipation. Has support of Mortimer Fries, son, who used max assist to get her up to Bedside commode to void and to have a stool during the home visit. She was noted to have used a glove to assist with removal of fecal impaction "I hurt so much I can't even  poop". She was noted to expel gas while on the bedside commode. Noted dry heaves and reported nausea without emesis during home visit    Transportation to upcoming appointments may be available if local by family car per Barbara Blickenstaff.  She states she may have to use an ambulance if she has to go to see [name] in [place]  and does not feel she can ride in a car for the distance. She reports use of an ambulance for transportation before Pt observed gradually getting her   Follow up Orthopedic services - CM discussed with Barbara Remmel that considering the commute to [place], it maybe beneficial for her to see a local orthopedic provider . She has been seen previously by Dr Luna Glasgow tues wed thurs [phone]   Home health visits/DME- HHPT came out to visit on 09/14/17 Barbara Hightower reports the HHPT staff recommended called for another w/c Has rental w/c. Barbara Chervenak confirms the present w/c in her bed room is being rented for $40/month from [place] they called patient during visit [facility] PT per Barbara Beissel is scheduled to return "Thursday or Friday"  Medicines reports she is only as of 09/15/17 taking hydrocodone to prevent  from further "upsetting my stomach" After VS taken Cm encouraged use of BP and GERD medications  Personal care services - CM reviewed information from Aging, disability and transportation services (ADTS). CM discussed personal care services [facility]) to include - if the patent does not have medicaid they would have to private pay and use some private insurance. There are  In home aides to assist at  2 levels.  One level is a companion care provider (no bathing services, etc,$12/hr for 1-2 days /week minimal and  have to stay a minimal of 2 hrs and another level is Certified nursing assistant (CNA) services that is hands on services at $17/hr  Barbara Steege refused services today, 09/15/17. Barbara Daye denied the need for this CM to call ADTS for assistance  Cm reviewed the [facility] calendar [facility] TS information and [facility] 24 hr nurse line availability  Smoking/Emotions-Barbara Nutter continues to smoke as evidence today 09/15/17 by  Cigarettes and ashtray on her bed  During the call to Angie Barbara Neyer informed her her pain was so bad "I thought about ending it but did not"   Vision  Barbara Keithly reports during examination that she still experiences  Visual issues with her left eye after her stroke. She reports she has been notified by Dr Gershon Crane of changes in location.  CM discussed other in network listed ophthalmologist locally.   Upcoming appointments When Cm inquired Barbara Cleavenger state she does not have a follow up appointment for CV, GI (last seen 09/04/17 0930 for hard stools)  and neurology (Dr Merlene Laughter) She report she was informed to follow up in "one year" for Dr Merlene Laughter and CV, Dr Zandra Abts (last seen 03/23/17 at 1430). Epic does not indicate any follow up appointments.  CM encouraged contact with her medical providers to schedule appointments.  Plan:   CM call to [name] office to get an appointment for 2 week f/u as ordered on 09/10/17.  His RN,  Yvetta Coder sent a note to [name] to see if patient can avoid transport to  [facility] and be seen by Dr Luna Glasgow locally. CM referred her to Epic 09/10/16 ED MD notes   CM inquired about having Barbara Sia seen by Dr  In Linna [place] versus traveling to [facility]  with Angie at Dr Luna Glasgow office to make Dr Luna Glasgow aware of recent ED visit. Angie to send a note to Dr Luna Glasgow to look at about this concern and will call back. During the home visit,  Angie called back to update CM and Barbara Cimini that Dr Luna Glasgow request she be seen by Dr Aline Brochure and Janace Hoard will send a message to Dr Aline Brochure and follow up with CM or Barbara Astorino  CM called [facility]  to check on home health orders for RN and  aide but was disconnected x 2 prior to CM requesting to speak with any staff member related to home health and DME for patient CM and the patient were informed that an office visit was needed for a face to face prior to any assistance with orders CM and patient spoke with Joellen Jersey at Primary care provider office who states Dr Hilma Favors was consulted and an office visit was requested or  follow up with orthopedic provider encouraged Katie confirmed a Rx for w/c was sent to [facility]. Katie discussed a call from the patient to request more pain medication and the assistance provided by [facility]   CM called back to [name] office to request assist with HHRN/aide, pain medication and w/c Spoke with Lundi Discussed transportation and calls to Primary care provider and [place] [facility].  Lundi to consult [name] and return a call to CM or Barbara Lacross  CM completed the transition of care assessment, reviewed medications, checked and filled medication container and left a copy of the current medication list for Barbara Dorian grand daughter to complete further medication container refills  Route note and collaborate with care team members listed in [place] Problem One     Most Recent Value  Care Plan Problem One  Transition of care from [place] to home (medications,  home health/DME needs, referrals/appointments)  Role Documenting the Problem One  Care Management [place] for Problem One  Active  [facility] Long Term Goal   Patient will have all services/DME for transition of care to home from snf  [place] Term Goal Start Date  06/24/17  [facility] Long Term Goal Met Date  08/04/17  Interventions for Problem One Long Term Goal  TOC assessment, pcp/SW/home health collaboration, Education on home care, meds, diet, home health, reconcile medications  [facility] CM Short Term Goal #1   Patient will have medications reconciled and home health ordered and DME needed for her care  [facility] CM Short Term Goal #1 Start Date  06/24/17  [facility] CM Short Term Goal #1 Met Date  08/04/17  Interventions for Short Term Goal #1  pcp/SW/home health collaboration, Education on home care, meds, diet, home health, reconcile medications  [facility] CM Short Term Goal #2   Patient will receive a referral and appointment for neurology   [facility] CM Short Term Goal #2 Start Date  06/26/17  [facility] CM Short Term Goal #2 Met Date  07/17/17  Interventions for Short Term Goal #2  Review EPIC chart for seizure onset/tx, contact neurology for referral, assist with referral clinicals, obtain neurology appointment,     [facility] CM Care Plan Problem Two     Most Recent Value  Care Plan Problem Two  Risk for hospital readmission secondary to recently diagnosed   Role Documenting the Problem Two  Care Management [place] for Problem Two  Active  Interventions for Problem Two Long Term Goal    Discussed  with patient current clinical status, upcoming provider appointments,  medication reconciliation completed.  [facility] CCM TOC program initiated  [facility] Long Term Goal   Over the next 31 days, patient will not experience hospital readmission, as evidenced by patient reporting and review of EMR during [place] outreach  [place] Term Goal Start Date  09/15/17  [facility] CM Short Term Goal #1   Over the next 14 days,  patient will schedule hospital follow up office visit appointments with orthopedic MD, as evidenced by patient reporting during [facility] RN CCM outreach  [facility] CM Short Term Goal #1 Start Date  09/15/17  Interventions for Short Term Goal #2    Discussed with patient his plans to schedule hospital follow up appointments with orthopedic MD,  encouraged patient to schedule appointments promptly, calls to MD offices (primary and orthopedic with patient during home visit       [place]. Lavina Hamman, RN, BSN, [place] Care Management [phone]

## 2017-09-15 NOTE — Telephone Encounter (Signed)
Needs to see dr Delfino Lovett

## 2017-09-15 NOTE — Telephone Encounter (Signed)
Patient went to [facility] ED on 09/10/17.  She has a closed displaced fx of the anterior column of the acetabulum.  Dr. Alvan Dame was called and he said to have her see Dr. [name] in 2 weeks.  Patient states she does not want to go to [facility] for treatment.  She initially requested to see Dr. Luna Glasgow.  Dr. Luna Glasgow reviewed her information and stated he felt that she should see Dr. Aline Brochure.  Would you please review the ED notes and imaging for this patient?  Please advise if you would see her  Thanks

## 2017-09-16 NOTE — Telephone Encounter (Signed)
Pt has been advised.

## 2017-09-18 ENCOUNTER — Other Ambulatory Visit: Payer: Self-pay

## 2017-09-18 ENCOUNTER — Emergency Department (HOSPITAL_COMMUNITY)
Admission: EM | Admit: 2017-09-18 | Discharge: 2017-09-18 | Disposition: A | Discharge status: Home / Self Care | Payer: Medicare Other | Attending: Emergency Medicine | Admitting: Emergency Medicine

## 2017-09-18 ENCOUNTER — Encounter (HOSPITAL_COMMUNITY): Payer: Self-pay | Admitting: Emergency Medicine

## 2017-09-18 DIAGNOSIS — M5136 Other intervertebral disc degeneration, lumbar region: Secondary | ICD-10-CM | POA: Diagnosis not present

## 2017-09-18 DIAGNOSIS — Z760 Encounter for issue of repeat prescription: Secondary | ICD-10-CM

## 2017-09-18 DIAGNOSIS — I350 Nonrheumatic aortic (valve) stenosis: Secondary | ICD-10-CM | POA: Diagnosis not present

## 2017-09-18 DIAGNOSIS — R52 Pain, unspecified: Secondary | ICD-10-CM | POA: Diagnosis not present

## 2017-09-18 DIAGNOSIS — I2510 Atherosclerotic heart disease of native coronary artery without angina pectoris: Secondary | ICD-10-CM | POA: Insufficient documentation

## 2017-09-18 DIAGNOSIS — Z79899 Other long term (current) drug therapy: Secondary | ICD-10-CM | POA: Diagnosis not present

## 2017-09-18 DIAGNOSIS — Z8781 Personal history of (healed) traumatic fracture: Secondary | ICD-10-CM | POA: Insufficient documentation

## 2017-09-18 DIAGNOSIS — M2550 Pain in unspecified joint: Secondary | ICD-10-CM | POA: Diagnosis not present

## 2017-09-18 DIAGNOSIS — G4733 Obstructive sleep apnea (adult) (pediatric): Secondary | ICD-10-CM | POA: Diagnosis not present

## 2017-09-18 DIAGNOSIS — I10 Essential (primary) hypertension: Secondary | ICD-10-CM | POA: Diagnosis not present

## 2017-09-18 DIAGNOSIS — Z955 Presence of coronary angioplasty implant and graft: Secondary | ICD-10-CM | POA: Insufficient documentation

## 2017-09-18 DIAGNOSIS — I252 Old myocardial infarction: Secondary | ICD-10-CM | POA: Diagnosis not present

## 2017-09-18 DIAGNOSIS — E1151 Type 2 diabetes mellitus with diabetic peripheral angiopathy without gangrene: Secondary | ICD-10-CM | POA: Diagnosis not present

## 2017-09-18 DIAGNOSIS — Z7401 Bed confinement status: Secondary | ICD-10-CM | POA: Diagnosis not present

## 2017-09-18 DIAGNOSIS — F17210 Nicotine dependence, cigarettes, uncomplicated: Secondary | ICD-10-CM | POA: Insufficient documentation

## 2017-09-18 DIAGNOSIS — K7581 Nonalcoholic steatohepatitis (NASH): Secondary | ICD-10-CM | POA: Diagnosis not present

## 2017-09-18 DIAGNOSIS — R102 Pelvic and perineal pain: Secondary | ICD-10-CM | POA: Diagnosis not present

## 2017-09-18 DIAGNOSIS — Z8673 Personal history of transient ischemic attack (TIA), and cerebral infarction without residual deficits: Secondary | ICD-10-CM | POA: Insufficient documentation

## 2017-09-18 DIAGNOSIS — Z7982 Long term (current) use of aspirin: Secondary | ICD-10-CM | POA: Diagnosis not present

## 2017-09-18 DIAGNOSIS — S72302D Unspecified fracture of shaft of left femur, subsequent encounter for closed fracture with routine healing: Secondary | ICD-10-CM | POA: Diagnosis not present

## 2017-09-18 DIAGNOSIS — E119 Type 2 diabetes mellitus without complications: Secondary | ICD-10-CM | POA: Diagnosis not present

## 2017-09-18 DIAGNOSIS — Z9181 History of falling: Secondary | ICD-10-CM | POA: Diagnosis not present

## 2017-09-18 DIAGNOSIS — R279 Unspecified lack of coordination: Secondary | ICD-10-CM | POA: Diagnosis not present

## 2017-09-18 DIAGNOSIS — R269 Unspecified abnormalities of gait and mobility: Secondary | ICD-10-CM | POA: Diagnosis not present

## 2017-09-18 DIAGNOSIS — D649 Anemia, unspecified: Secondary | ICD-10-CM | POA: Diagnosis not present

## 2017-09-18 MED ORDER — HYDROCODONE-ACETAMINOPHEN 5-325 MG PO TABS: 1.0000 | ORAL_TABLET | ORAL | 0 refills | Status: DC | PRN

## 2017-09-18 MED ORDER — HYDROCODONE-ACETAMINOPHEN 5-325 MG PO TABS
2.0000 | ORAL_TABLET | Freq: Once | ORAL | Status: AC
  Administered 2017-09-18: 2 via ORAL
  Filled 2017-09-18: qty 2

## 2017-09-18 NOTE — ED Provider Notes (Addendum)
[facility] EMERGENCY DEPARTMENT Provider Note   CSN: [id number] Arrival date & time: 09/18/17  1342     History   Chief Complaint Chief Complaint  Patient presents with  . Medication Refill    HPI Aviv Rota is a 77 y.o. female.  Patient with history of cirrhosis, high blood pressure, reflux, recent acetabular fracture presents with worsening pain and she is out of her narcotic medication. Patient's pain is currently worsened since she ran out and she's been trying tramadol with no improvement. No fevers or chills, no shortness of breath, no other symptoms. Patient has orthopedic follow-up. No new falls. No new pain is similar to previous.      Past Medical History:  Diagnosis Date  . Anemia   . Aortic insufficiency    Moderate  . Back pain   . Carotid stenosis, bilateral   . Cirrhosis ([place])   . Closed left femoral fracture ([place]) 04/21/2017  . Coronary artery disease    Stent x 2 RCA 1995, cardiac catheterization 09/2016 showing only mild atherosclerosis  . DDD (degenerative disc disease), lumbar   . Essential hypertension   . Falls   . GERD (gastroesophageal reflux disease)   . Hiatal hernia   . History of stroke   . Hypercholesteremia   . IBS (irritable bowel syndrome)   . Non-alcoholic fatty liver disease   . OSA (obstructive sleep apnea)    no CPAP  . Pericardial effusion    a. small by echo 2018.  Marland Kitchen PSVT (paroxysmal supraventricular tachycardia) ([place])   . Recurrent UTI   . Seizures ([place])   . Stroke ([place])   . Type 2 diabetes mellitus ([place])    off medication  . Varices, esophageal [facility])     Patient Active Problem List   Diagnosis Date Noted  . Syncope and collapse   . Syncope 08/24/2017  . Closed left femoral fracture ([place]) 04/21/2017  . Seizure disorder ([place]) 04/21/2017  . Closed pertrochanteric fracture of femur, left, initial encounter ([place]) 03/02/2017  . Seizure ([place]) 02/28/2017  . Hip fracture ([place]) 02/28/2017  . Chest pain 12/07/2016  .  Atypical chest pain 12/07/2016  . Tachycardia   . NSTEMI (non-ST elevated myocardial infarction) ([place]) 11/02/2016  . Multifocal atrial tachycardia ([place]) 09/25/2016  . Elevated troponin 09/24/2016  . Hypokalemia 09/23/2016  . TIA (transient ischemic attack) 05/05/2016  . Intractable nausea and vomiting 10/15/2015  . Acute coronary syndrome ([place]) 06/01/2015  . Bronchitis 06/01/2015  . Thrombocytopenia ([place]) 06/01/2015  . Cerebral infarction ([place]) 03/06/2014  . Lower urinary tract infectious disease   . PSVT (paroxysmal supraventricular tachycardia) ([place]) 12/09/2013  . Esophageal varices ([place]) 03/29/2013  . Hematemesis 03/29/2013  . Hepatic cirrhosis ([place]) 03/29/2013  . Presence of stent in right coronary artery 07/04/2011  . OSA (obstructive sleep apnea) 07/04/2011  . Aortic sclerosis 07/04/2011  . Aortic insufficiency 07/04/2011  . HTN (hypertension) 07/04/2011  . Carotid stenosis, bilateral 07/04/2011  . Chest pain at rest 07/03/2011  . CAD (coronary artery disease) 07/03/2011  . DM type 2 (diabetes mellitus, type 2) ([place]) 07/03/2011  . Hypercholesteremia 07/03/2011  . GERD (gastroesophageal reflux disease) 07/03/2011    Past Surgical History:  Procedure Laterality Date  . [place]  . APPENDECTOMY    . BACK SURGERY  1985  . CARDIAC CATHETERIZATION  [phone]   Stent to the proximal RCA after MI   . CARDIAC CATHETERIZATION N/A 09/26/2016   Procedure: Left Heart Cath and Coronary Angiography;  Surgeon: Leonie Man, MD;  Location: [place] CV LAB;  Service: Cardiovascular;  Laterality: N/A;  . CATARACT EXTRACTION W/PHACO Right 07/04/2014   Procedure: CATARACT EXTRACTION PHACO AND INTRAOCULAR LENS PLACEMENT (IOC);  Surgeon: Elta Guadeloupe T. Gershon Crane, MD;  Location: [facility];  Service: Ophthalmology;  Laterality: Right;  CDE:13.13  . COLONOSCOPY    . DILATION AND CURETTAGE OF UTERUS     x2  . Epi Retinal Membrane Peel Left   . ERCP    . ESOPHAGEAL BANDING N/A  04/01/2013   Procedure: ESOPHAGEAL BANDING;  Surgeon: Rogene Houston, MD;  Location: [facility] SU[facility];  Service: Endoscopy;  Laterality: N/A;  . ESOPHAGEAL BANDING N/A 05/24/2013   Procedure: ESOPHAGEAL BANDING;  Surgeon: Rogene Houston, MD;  Location: [facility];  Service: Endoscopy;  Laterality: N/A;  . ESOPHAGEAL BANDING N/A 06/21/2014   Procedure: ESOPHAGEAL BANDING;  Surgeon: Rogene Houston, MD;  [facility];  Service: Endoscopy;  Laterality: N/A;  . ESOPHAGOGASTRODUODENOSCOPY N/A 04/01/2013   Procedure: ESOPHAGOGASTRODUODENOSCOPY (EGD);  Surgeon: Rogene Houston, MD;  [facility];  Service: Endoscopy;  Laterality: N/A;  230-rescheduled to 8:30am Ann notified pt  . ESOPHAGOGASTRODUODENOSCOPY N/A 05/24/2013   Procedure: ESOPHAGOGASTRODUODENOSCOPY (EGD);  Surgeon: Rogene Houston, MD;  [facility];  Service: Endoscopy;  Laterality: N/A;  730  . ESOPHAGOGASTRODUODENOSCOPY N/A 06/21/2014   Procedure: ESOPHAGOGASTRODUODENOSCOPY (EGD);  Surgeon: Rogene Houston, MD;  [facility];  Service: Endoscopy;  Laterality: N/A;  930-rescheduled 10/14 @ 1200 Ann to notify pt  . EYE SURGERY  08   cataract surgery of the left eye  . FEMUR IM NAIL Left 03/02/2017   Procedure: INTRAMEDULLARY (IM) NAIL FEMORAL;  Surgeon: Rod Can, MD;  Location: [place];  Service: Orthopedics;  Laterality: Left;  . HARDWARE REMOVAL Right 01/17/2013   Procedure: REMOVAL OF HARDWARE AND EXCISION ULNAR STYLOID RIGHT WRIST;  Surgeon: Tennis Must, MD;  Location: [facility];  Service: Orthopedics;  Laterality: Right;  . MYRINGOTOMY  2012   both ears  . ORIF FEMUR FRACTURE Left 04/23/2017   Procedure: OPEN REDUCTION INTERNAL FIXATION (ORIF) DISTAL FEMUR FRACTURE (FRACTURE AROUND FEMORAL NAIL);  Surgeon: Altamese Monte Sereno, MD;  Location: [place];  Service: Orthopedics;  Laterality: Left;  . TONSILLECTOMY    . VAGINAL HYSTERECTOMY  1972  . WRIST SURGERY     rt wrist  hardwear removal    OB History    No data available       Home Medications    Prior to Admission medications   Medication Sig Start Date End Date Taking? Authorizing Provider  aspirin EC 81 MG tablet Take 81 mg by mouth daily.    Jake Samples, PA-C  atorvastatin (LIPITOR) 40 MG tablet Take 40 mg by mouth daily.    Jake Samples, PA-C  carvedilol (COREG) 12.5 MG tablet Take 1 tablet (12.5 mg total) by mouth 2 (two) times daily with a meal. 08/26/17   Tat, Shanon Brow, MD  diltiazem (CARDIZEM CD) 120 MG 24 hr capsule Take 120 mg by mouth daily.    Jake Samples, PA-C  DiphenhydrAMINE HCl, Sleep, (SLEEP AID) 25 MG CAPS Take 1-2 capsules by mouth at bedtime as needed (for sleep).    [provider]  docusate sodium (COLACE) 100 MG capsule Take 100 mg by mouth daily.     Redmond School, MD  furosemide (LASIX) 20 MG tablet Take 20 mg by mouth daily.  [provider]  HYDROcodone-acetaminophen (NORCO) 5-325 MG tablet Take 1 tablet by mouth every 6 (six) hours as needed for moderate pain or severe pain. 09/10/17   Orlie Dakin, MD  HYDROcodone-acetaminophen (NORCO) 5-325 MG tablet Take 1-2 tablets by mouth every 4 (four) hours as needed. 09/18/17   Elnora Morrison, MD  isosorbide mononitrate (IMDUR) 30 MG 24 hr tablet Take 30 mg by mouth daily.     Delman Cheadle J, PA-C  lisinopril (PRINIVIL,ZESTRIL) 20 MG tablet Take 20 mg by mouth daily.    Delman Cheadle J, PA-C  ondansetron (ZOFRAN) 4 MG tablet Take 1 tablet (4 mg total) by mouth every 8 (eight) hours as needed for nausea or vomiting. 09/10/17   Setzer, Rona Ravens, NP  pantoprazole (PROTONIX) 40 MG tablet Take 40 mg by mouth daily.    [provider]  phenytoin (DILANTIN) 200 MG ER capsule Take 1 capsule (200 mg total) by mouth daily. Patient not taking: Reported on 09/15/2017 08/27/17   Orson Eva, MD  ranitidine (ZANTAC) 150 MG tablet Take 150 mg by mouth 2 (two) times daily.    [provider]  traMADol (ULTRAM) 50 MG tablet Take 50 mg by mouth 4 (four) times daily. 10 day course starting on 09/10/17    [provider]  zolpidem (AMBIEN) 10 MG tablet take 1 tablet by mouth at bedtime if needed 05/01/17   [provider]    Family History Family History  Problem Relation Age of Onset  . Diabetes Mother   . Heart failure Father   . Heart failure Maternal Aunt     Social History Social History   Tobacco Use  . Smoking status: Current Some Day Smoker    Packs/day: 0.25    Years: 50.00    Pack years: 12.50    Types: Cigarettes    Last attempt to quit: 01/13/2012    Years since quitting: 5.6  . Smokeless tobacco: Never Used  Substance Use Topics  . Alcohol use: No    Alcohol/week: 0.0 oz  . Drug use: No     Allergies   Tape   Review of Systems Review of Systems  Constitutional: Negative for chills and fever.  HENT: Negative for congestion.   Eyes: Negative for visual disturbance.  Respiratory: Negative for shortness of breath.   Cardiovascular: Negative for chest pain.  Gastrointestinal: Negative for abdominal pain and vomiting.  Genitourinary: Negative for dysuria and flank pain.  Musculoskeletal: Positive for arthralgias and gait problem. Negative for back pain, neck pain and neck stiffness.  Skin: Negative for rash.  Neurological: Negative for light-headedness and headaches.     Physical Exam Updated Vital Signs BP (!) 176/66 (BP Location: Right Arm)   Pulse (!) 105   Temp 98.1 F (36.7 C) (Oral)   Resp 17   SpO2 97%   Physical Exam  Constitutional: She is oriented to person, place, and time. She appears well-developed and well-nourished.  HENT:  Head: Normocephalic and atraumatic.  Eyes: Right eye exhibits no discharge. Left eye exhibits no discharge.  Neck: Neck supple. No tracheal deviation present.  Pulmonary/Chest: Effort normal.  Abdominal: Soft. She exhibits no distension. There is no tenderness. There is no  guarding.  Musculoskeletal: She exhibits tenderness. She exhibits no edema.  Patient has tenderness to lower coccyx sacrum and pelvis region to palpation posteriorly. Patient does have tenderness with flexion of bilateral legs with radiation to that same area.  Neurological: She is alert and oriented to person,  place, and time.  Skin: Skin is warm. No rash noted.  Psychiatric: She has a normal mood and affect.  Nursing note and vitals reviewed.    ED Treatments / Results  Labs (all labs ordered are listed, but only abnormal results are displayed) Labs Reviewed - No data to display  EKG  EKG Interpretation None       Radiology No results found.  Procedures Procedures (including critical care time)  Medications Ordered in ED Medications  HYDROcodone-acetaminophen (NORCO/VICODIN) 5-325 MG per tablet 2 tablet (2 tablets Oral Given 09/18/17 1435)     Initial Impression / Assessment and Plan / ED Course  I have reviewed the triage vital signs and the nursing notes.  Pertinent labs & imaging results that were available during my care of the patient were reviewed by me and considered in my medical decision making (see chart for details).    Patient presents with gradually worsening pain since running out of Norco. Patient does have support at home and orthopedic follow-up. Plan to give pain meds here refill prescription outpatient follow-up. Mild tachy likely pain related, pain meds given.  Results and differential diagnosis were discussed with the patient/parent/guardian. Xrays were independently reviewed by myself.  Close follow up outpatient was discussed, comfortable with the plan.   Medications  HYDROcodone-acetaminophen (NORCO/VICODIN) 5-325 MG per tablet 2 tablet (2 tablets Oral Given 09/18/17 1435)    Vitals:   09/18/17 1356  BP: (!) 176/66  Pulse: (!) 105  Resp: 17  Temp: 98.1 F (36.7 C)  TempSrc: Oral  SpO2: 97%    Final diagnoses:  Medication refill    Pelvic pain    Final Clinical Impressions(s) / ED Diagnoses   Final diagnoses:  Medication refill  Pelvic pain    ED Discharge Orders        Ordered    HYDROcodone-acetaminophen (NORCO) 5-325 MG tablet  Every 4 hours PRN     09/18/17 1438       Elnora Morrison, MD 09/18/17 1615    Elnora Morrison, MD 09/18/17 1615

## 2017-09-18 NOTE — Discharge Instructions (Signed)
Follow up with orthopaedic doctor as previously arranged, call for earlier appointment next week.  For severe pain take norco or vicodin however realize they have the potential for addiction and it can make you sleepy and has tylenol in it.  No operating machinery while taking.

## 2017-09-18 NOTE — ED Notes (Signed)
Awaiting ems to transport home.  c com contacted

## 2017-09-18 NOTE — ED Triage Notes (Signed)
Pt states ran out of hydrocodone 2 days ago. Has been taking ultram with no relief. Pt refuses to sit, states has to lay down. Pain due to broken tail bone per pt. Nad. A/o

## 2017-09-19 IMAGING — DX DG CHEST 2V
2 series · 2 of 2 positions shown · non-contrast
Comparison: 12/07/2016

CLINICAL DATA: CENTER CHEST PAIN SINCE LAST NIGHT, HX OF MI X 2,
MOST RECENT 2 YEARS AGO, FORMER SMOKER X 4 YEARS

EXAM:
CHEST  2 VIEW

[chest pa]
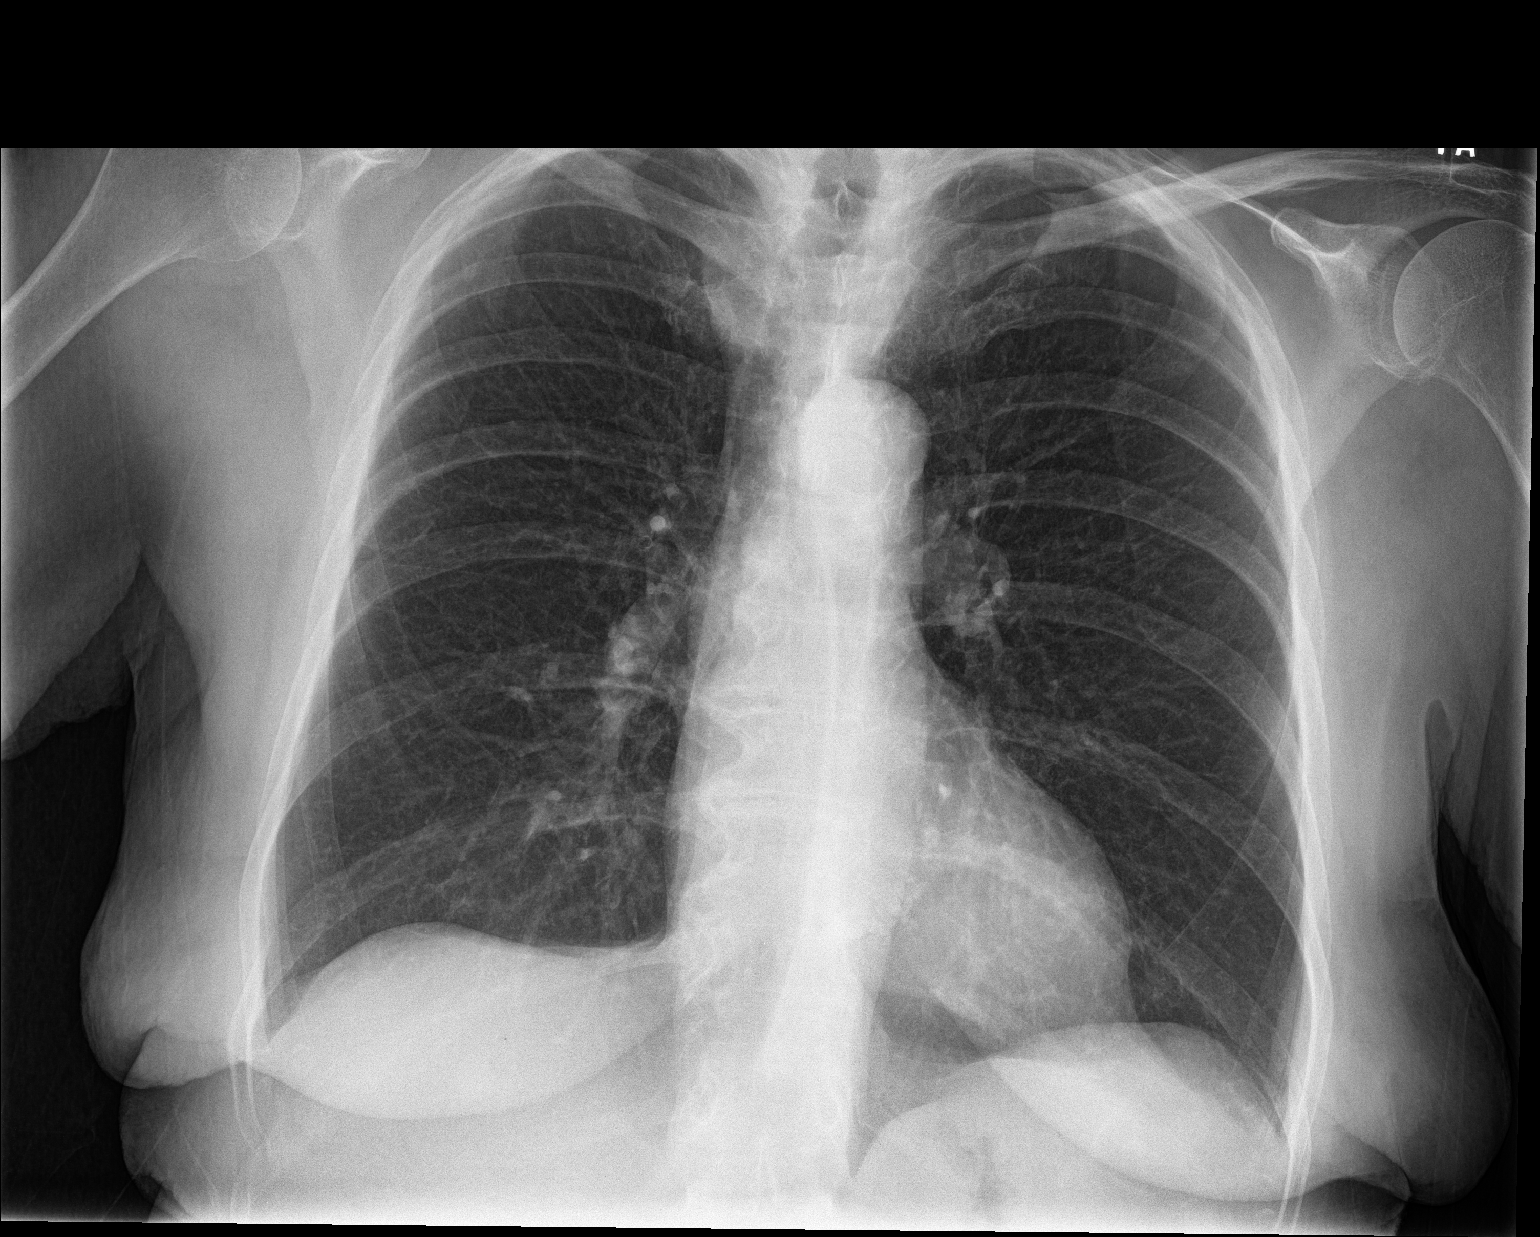

[chest lat]
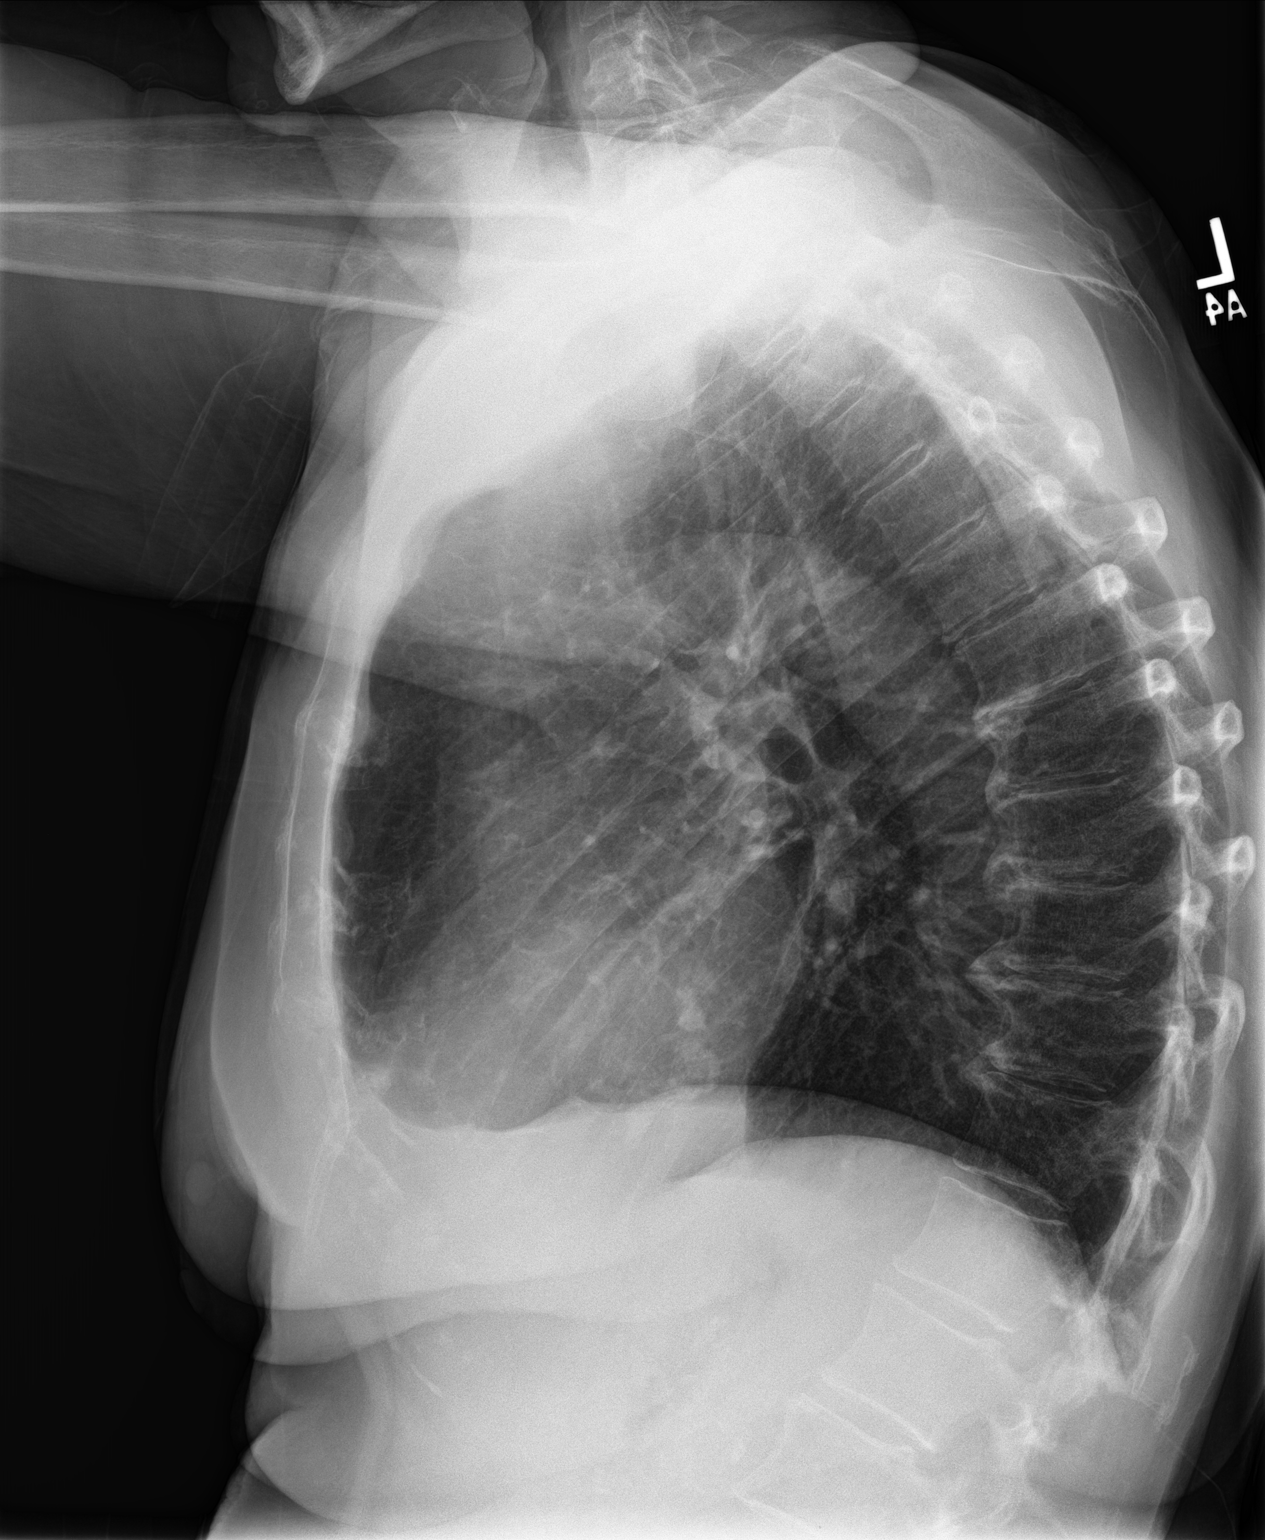

[2 of 2 positions shown; findings below may reference images not displayed]

FINDINGS: Lungs are hyperinflated. Heart size is normal. There are no focal
consolidations or pleural effusions. No pulmonary edema.
Degenerative changes are seen in thoracic spine.
IMPRESSION: No active cardiopulmonary disease.

## 2017-09-21 IMAGING — DX DG FEMUR 2+V*L*
4 series · 4 of 4 positions shown · non-contrast
Comparison: None.

CLINICAL DATA: Seizure.

EXAM:
LEFT FEMUR 2 VIEWS

[femur ap (1 of 2)]
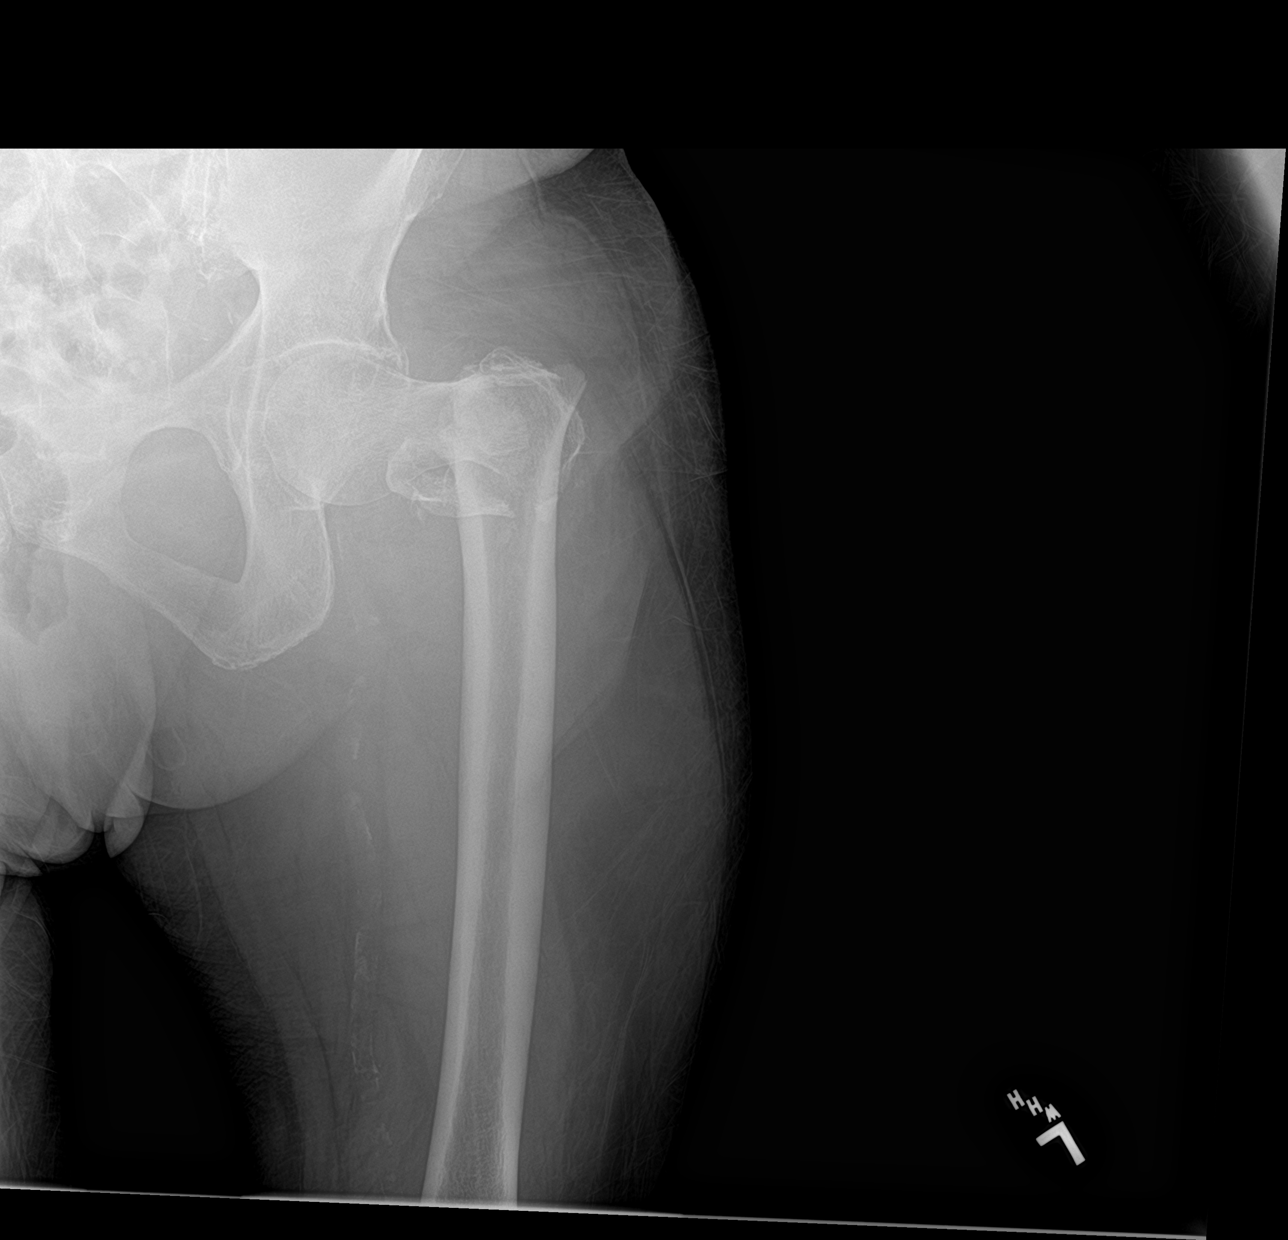

[femur ap (2 of 2)]
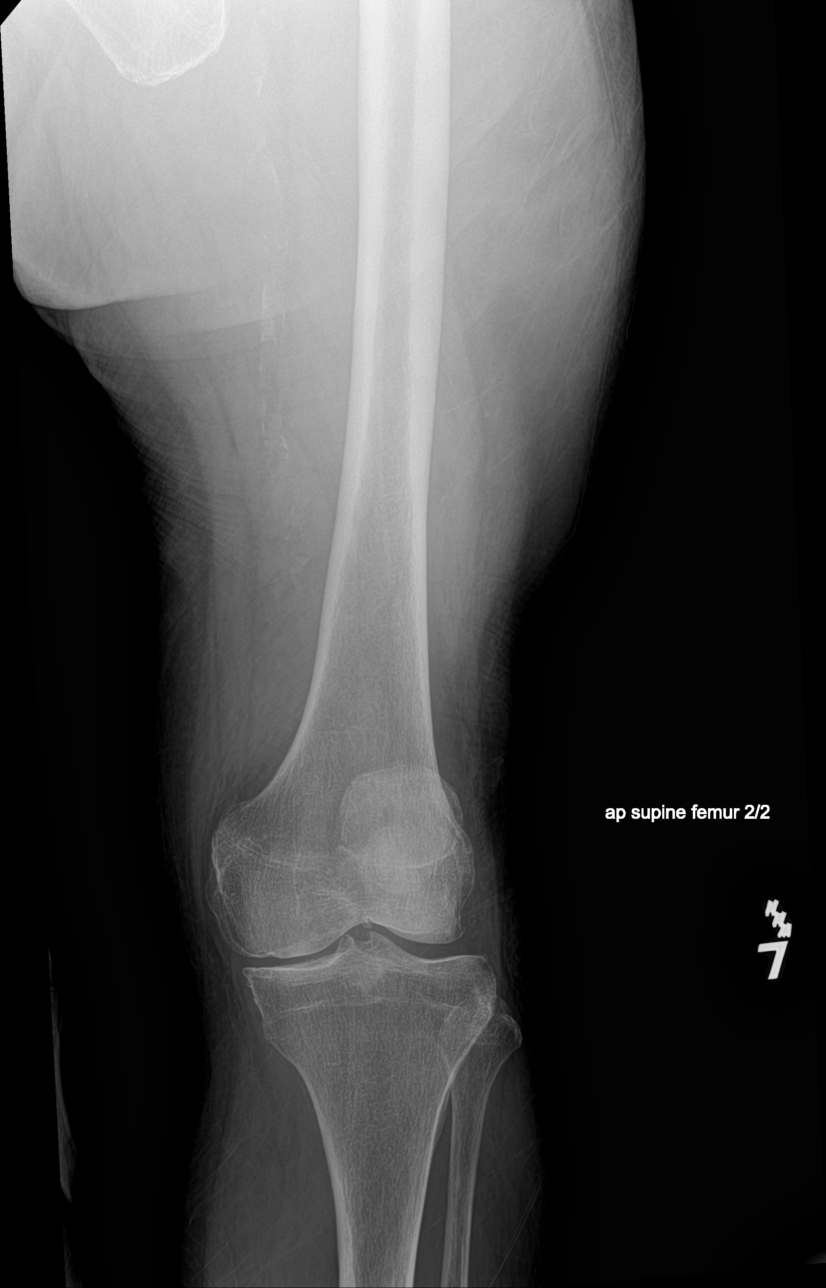

[femur lat (1 of 2)]
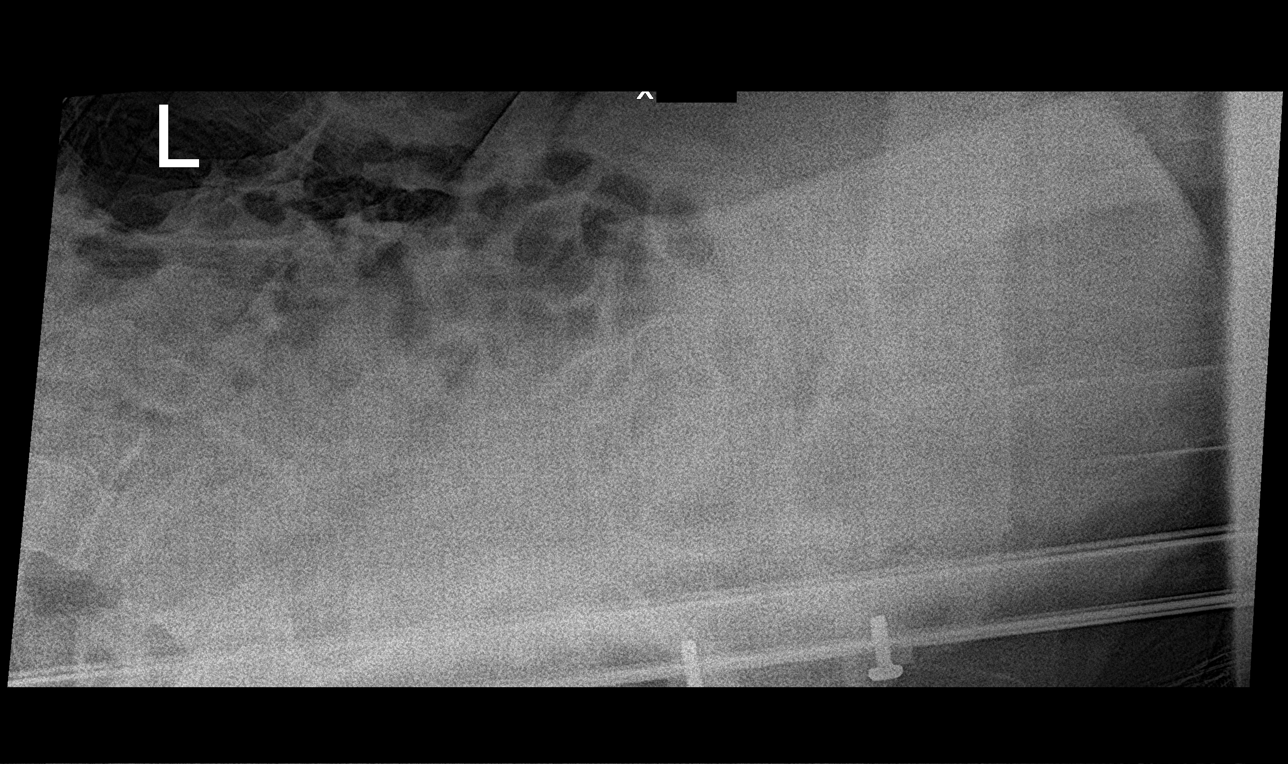

[femur lat (2 of 2)]
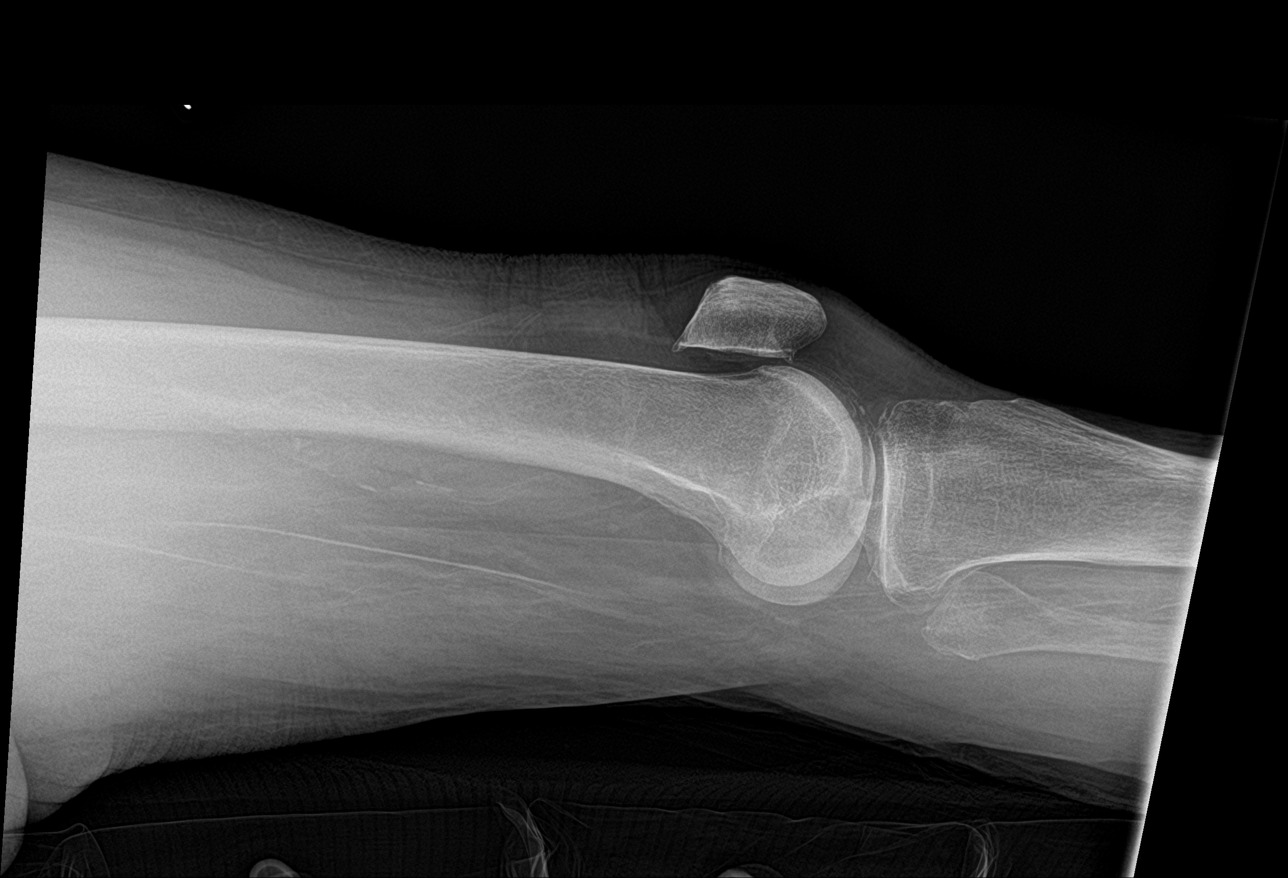

[4 of 4 positions shown; findings below may reference images not displayed]

FINDINGS: Diffuse osteopenia. Moderately displaced intertrochanteric fracture
of the left femur with exaggerated coxa Fabros deformity about the
fracture site. Mild degenerate changes of the left hip and knee.
Calcified plaque over the left femoral artery.
IMPRESSION: Moderately displaced left intertrochanteric fracture.

## 2017-09-21 IMAGING — DX DG ANKLE 2V *L*
2 series · 2 of 2 positions shown · non-contrast
Comparison: None.

CLINICAL DATA: Seizure.  No additional history provided.

EXAM:
LEFT ANKLE - 2 VIEW

[ankle ap]
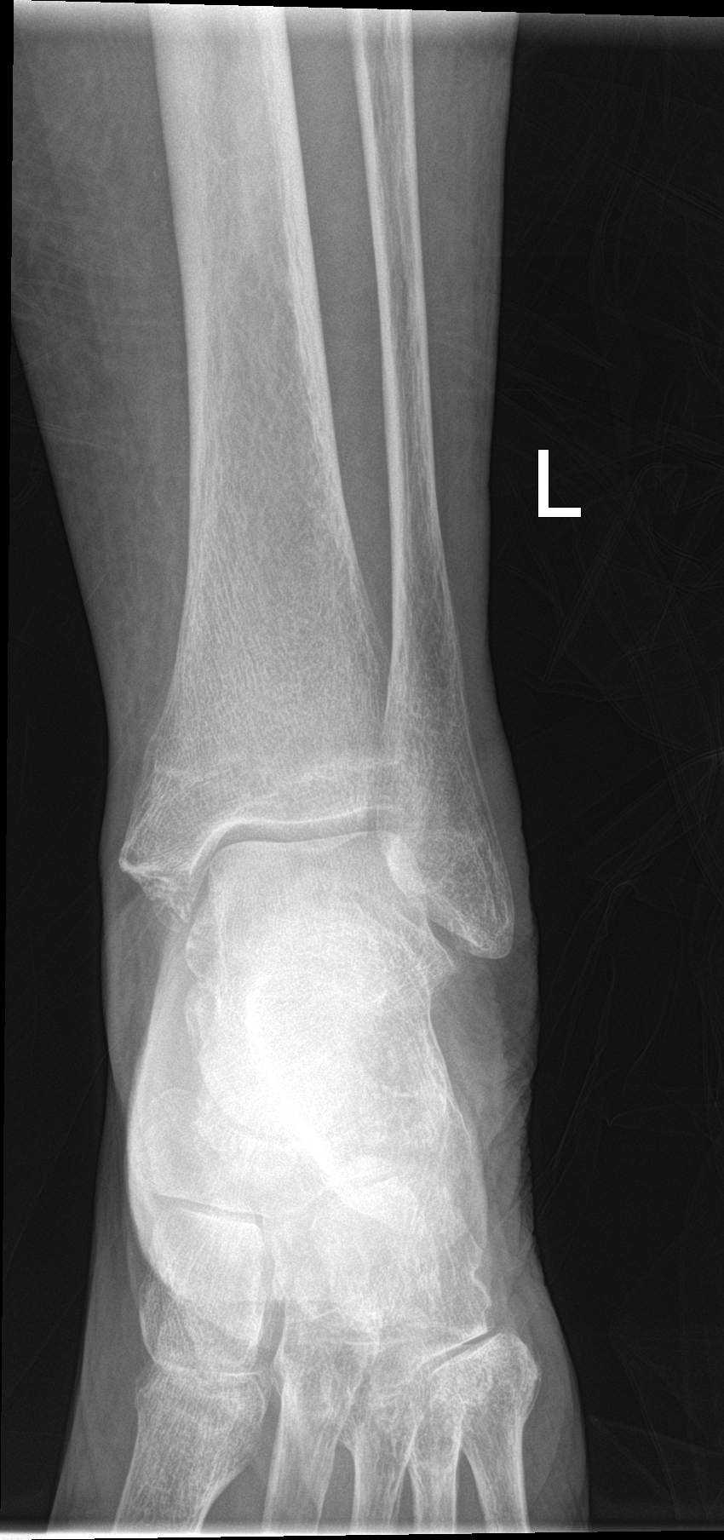

[ankle lat]
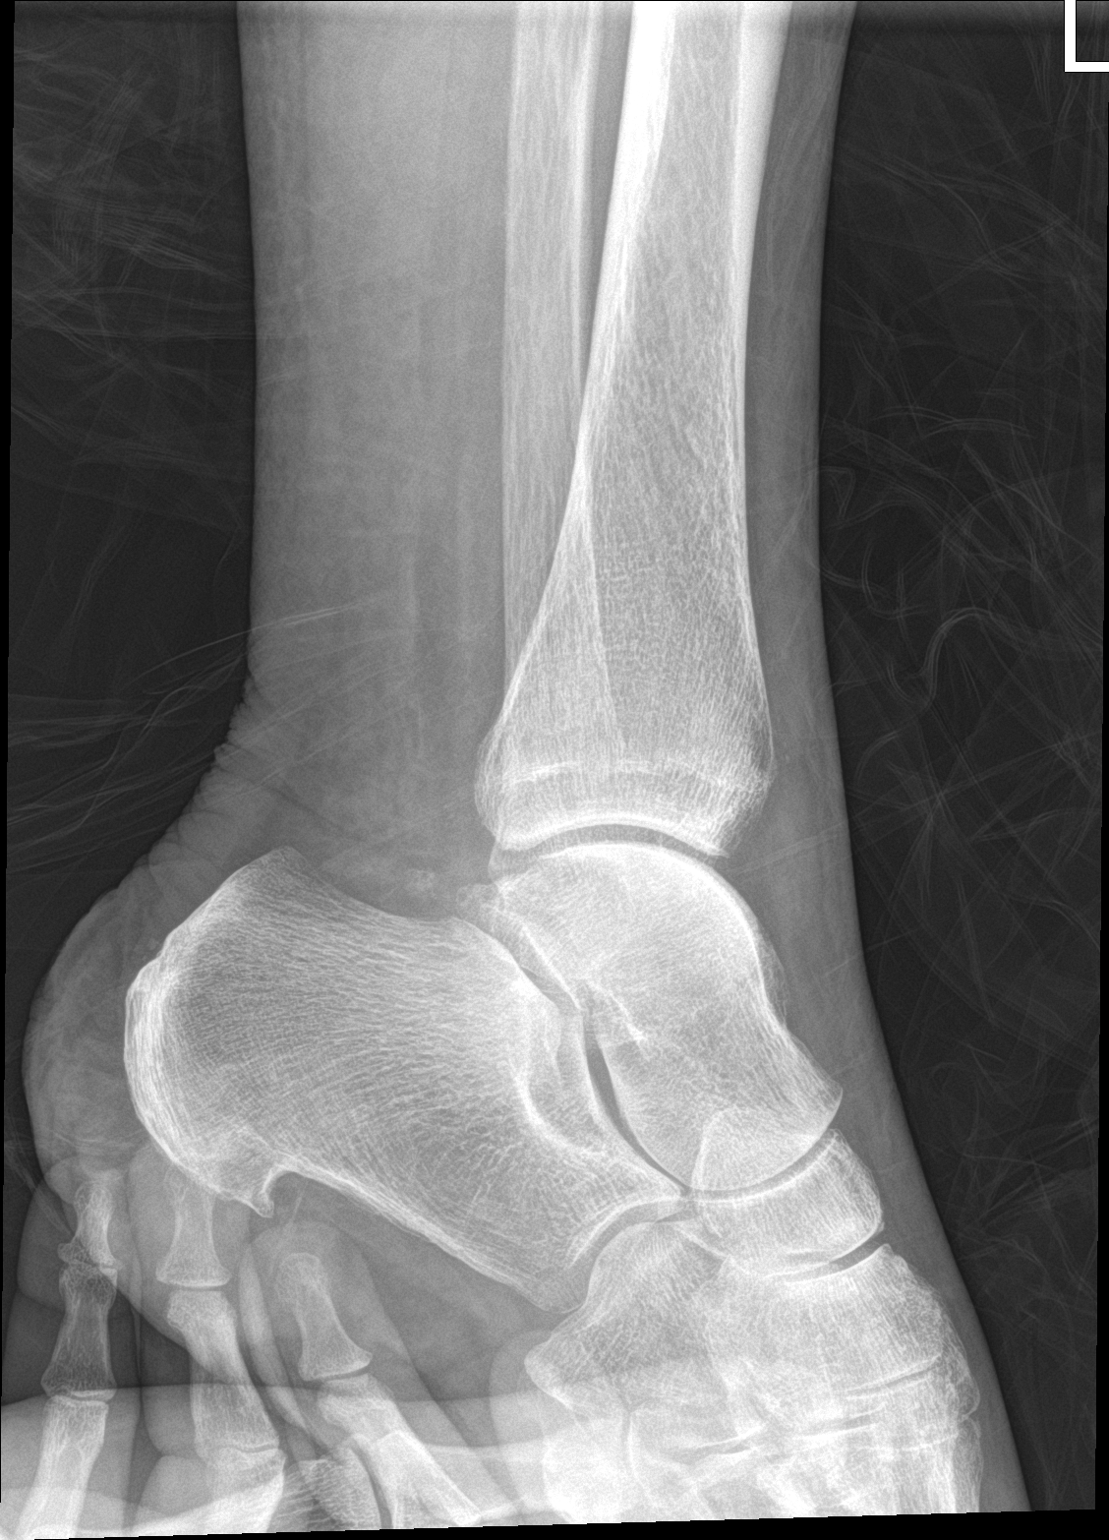

[2 of 2 positions shown; findings below may reference images not displayed]

FINDINGS: There is no evidence of fracture, dislocation, or joint effusion.
Ankle mortise is preserved. There is a small plantar calcaneal spur.
No bony destructive change or significant arthropathy. There is
diffuse soft tissue edema.
IMPRESSION: Diffuse soft tissue edema.  No acute osseous abnormality.

## 2017-09-21 IMAGING — DX DG PELVIS 1-2V
1 series · 1 of 1 positions shown · non-contrast
Comparison: CT abdomen and pelvis July 09, 2015

CLINICAL DATA: Seizures, pain.

EXAM:
PELVIS - 1-2 VIEW; CHEST  1 VIEW

[chest ap]
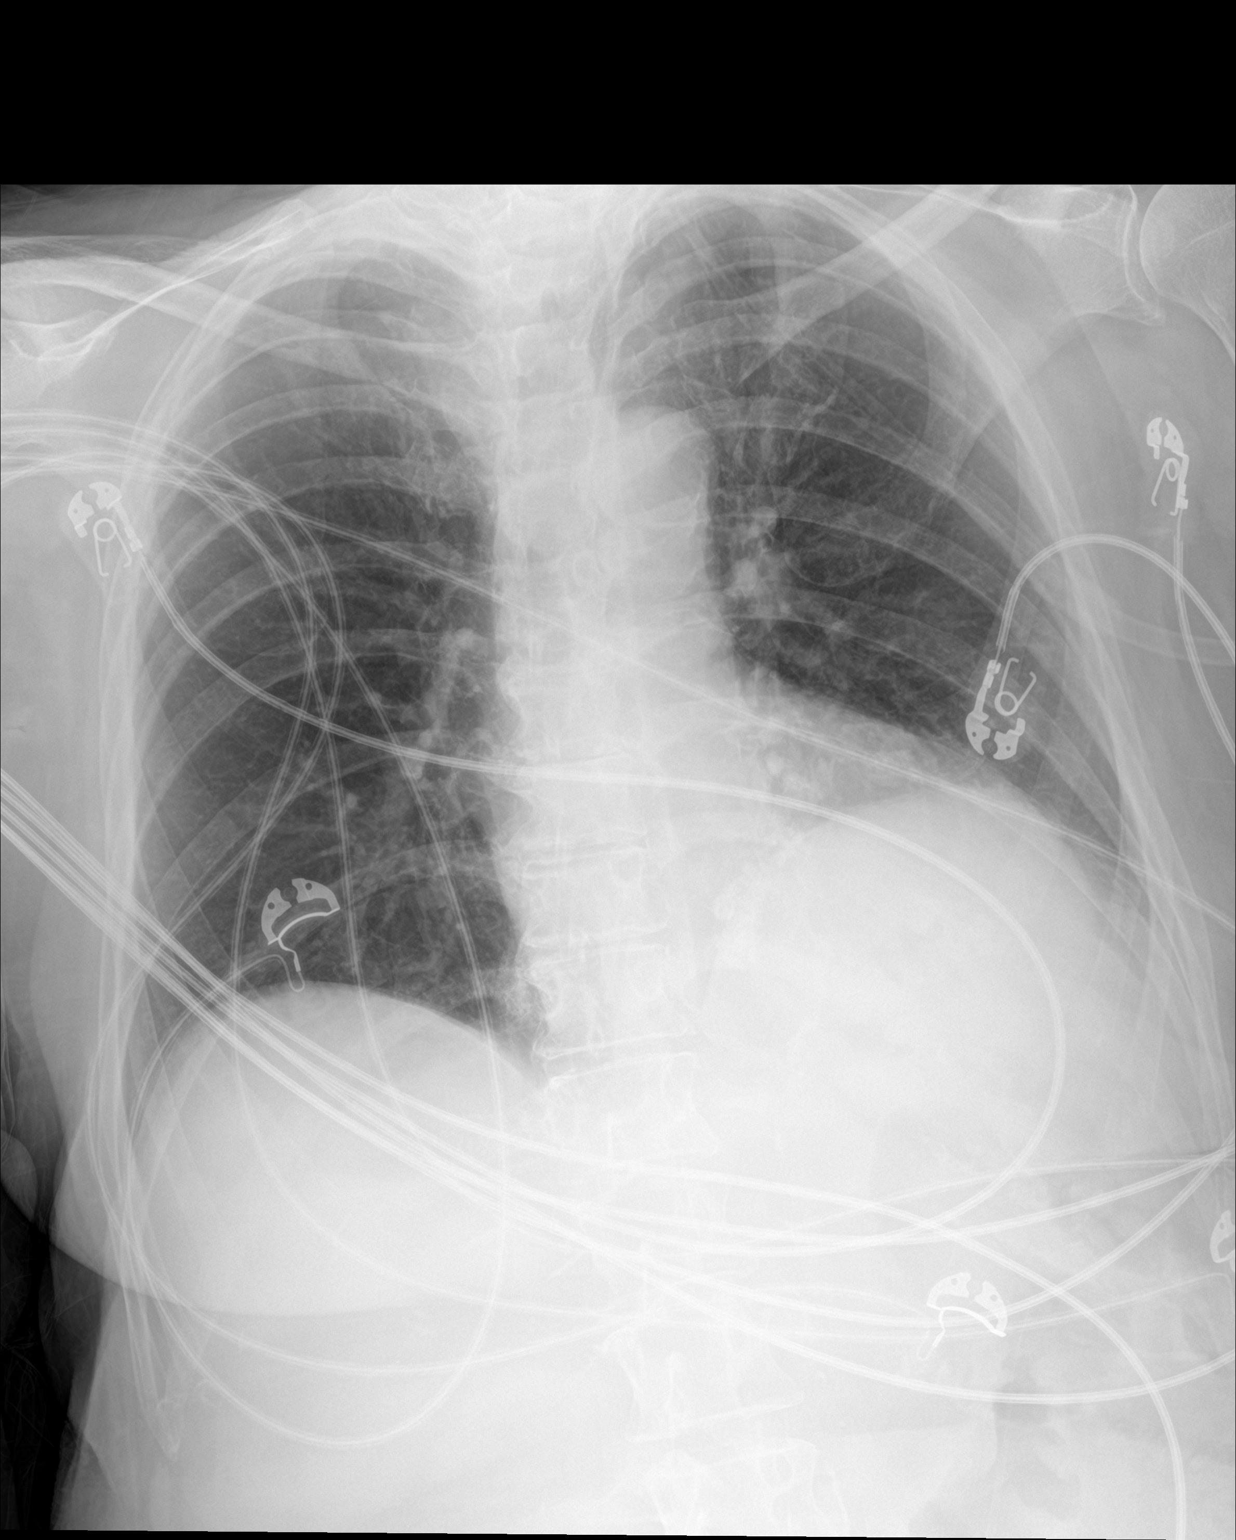

[1 of 1 positions shown; findings below may reference images not displayed]

FINDINGS: Chest: Cardiac silhouette is mildly enlarged, calcified aortic knob.
No pleural effusion or focal consolidation. Elevated LEFT
hemidiaphragm with strandy densities. No pneumothorax. Soft tissue
planes and included osseous structures are nonsuspicious.

Pelvis: Acute comminuted LEFT femur intertrochanteric fracture with
impaction. Varus angulation of the distal bony fragments. Femoral
heads are located. Osteopenia without destructive bony lesions.
Moderate vascular calcifications.
IMPRESSION: Chest: Mild cardiomegaly, LEFT lung base atelectasis.

Pelvis: Acute LEFT femur intertrochanteric fracture.

## 2017-09-21 IMAGING — CT CT HEAD W/O CM
3 series · 15 of 47 positions shown, 18 images · non-contrast
Comparison: Brain MRI dated 05/05/2016

CLINICAL DATA: 75-year-old female with seizures.

EXAM:
CT HEAD WITHOUT CONTRAST
TECHNIQUE: Contiguous axial images were obtained from the base of the skull
through the vertex without intravenous contrast.

[Series 2: head wo · axial · 0.42mm/px · z∈[+1625,+1750]mm · 9 of 31 slices shown, 12 images]
[im 3/31  brain]
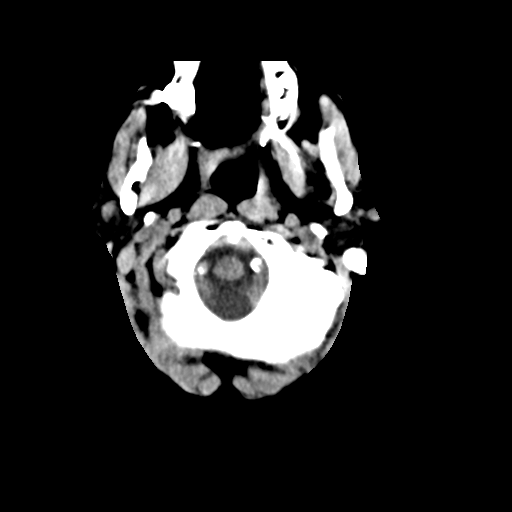
[im 3/31  bone]
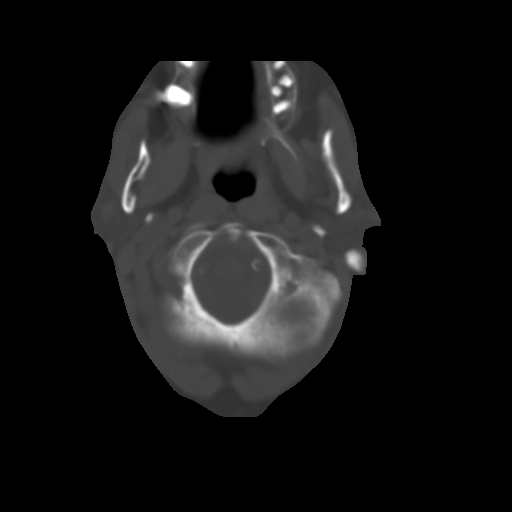
[im 6/31  brain]
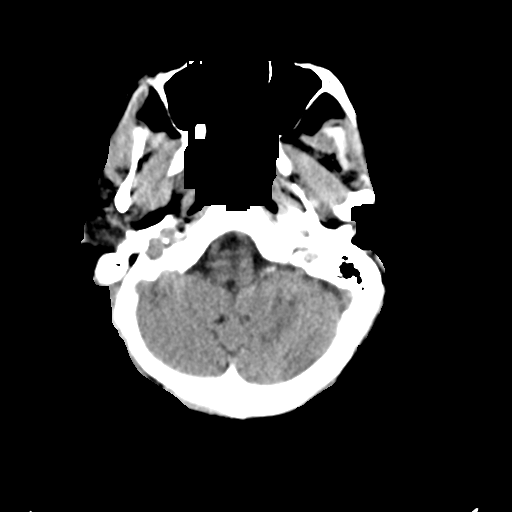
[im 9/31  brain]
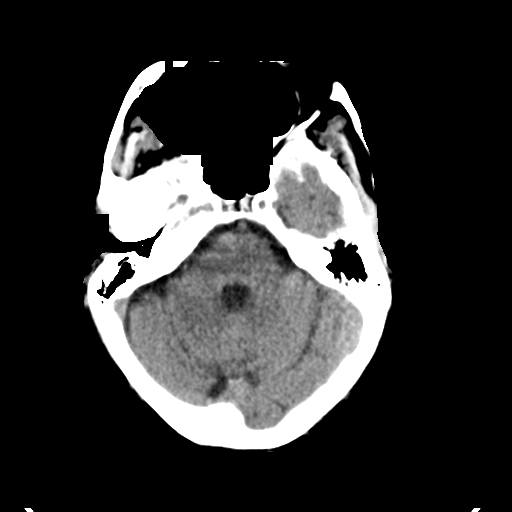
[im 12/31  brain]
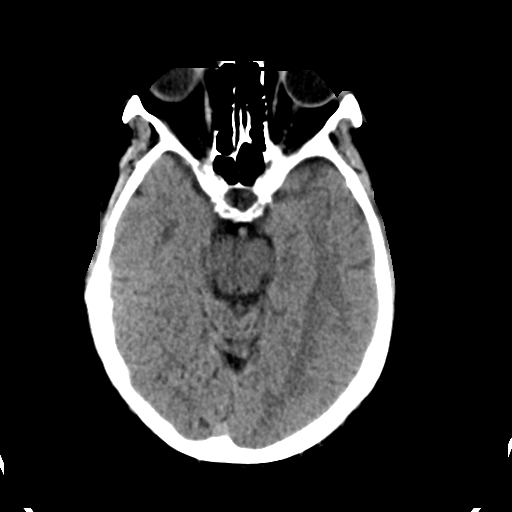
[im 16/31  brain]
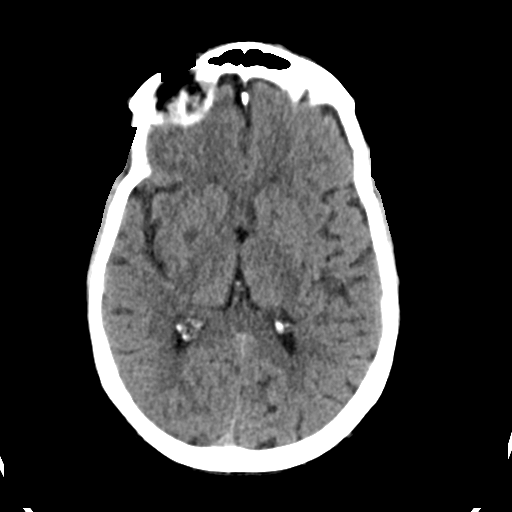
[im 16/31  bone]
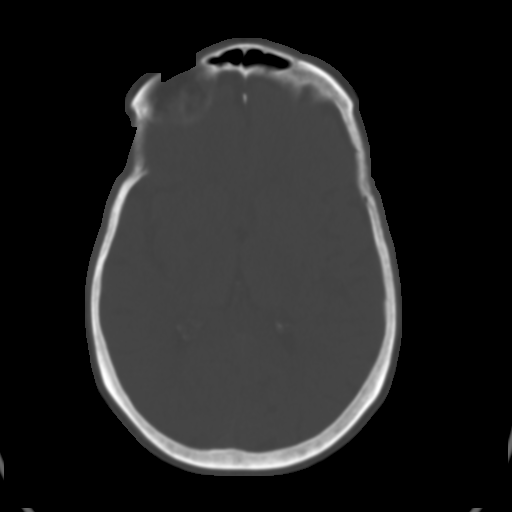
[im 19/31  brain]
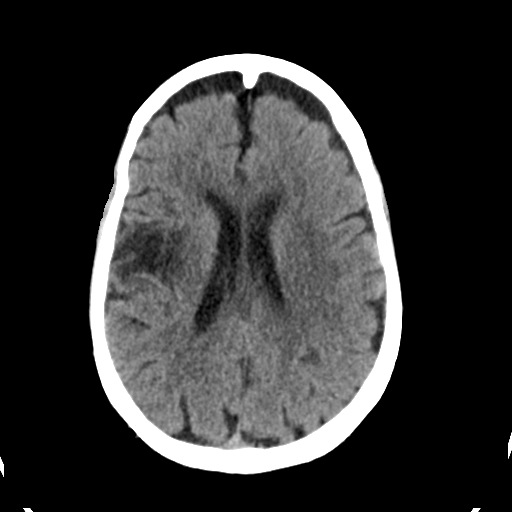
[im 22/31  brain]
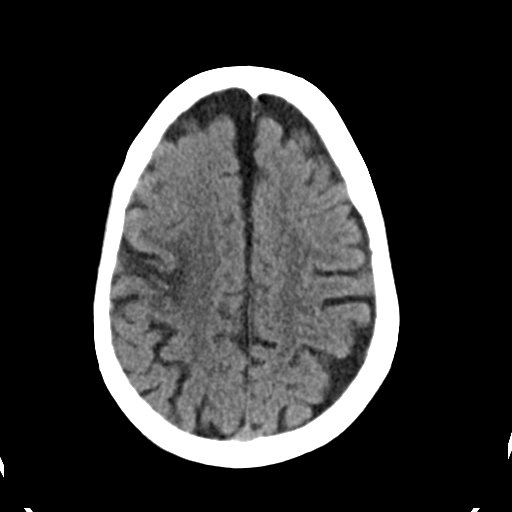
[im 25/31  brain]
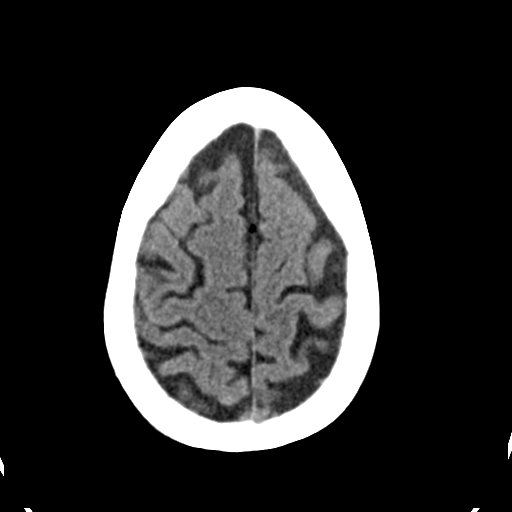
[im 28/31  brain]
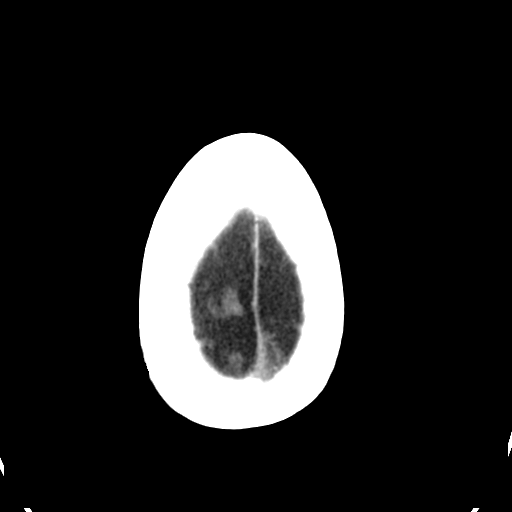
[im 28/31  bone]
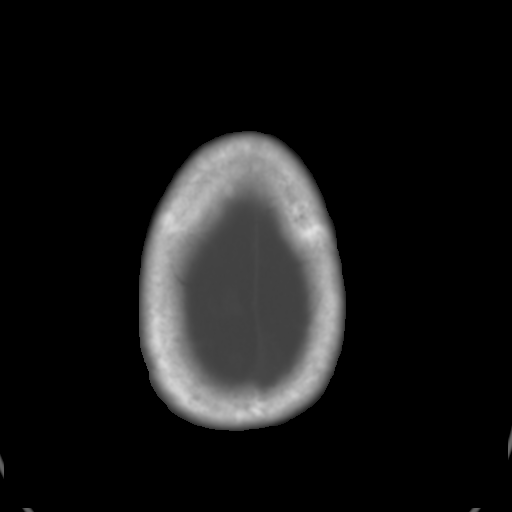

[Series 4: coronal soft tissue · coronal · 0.32mm/px · 3 of 74 slices shown]
[im 25/74  brain]
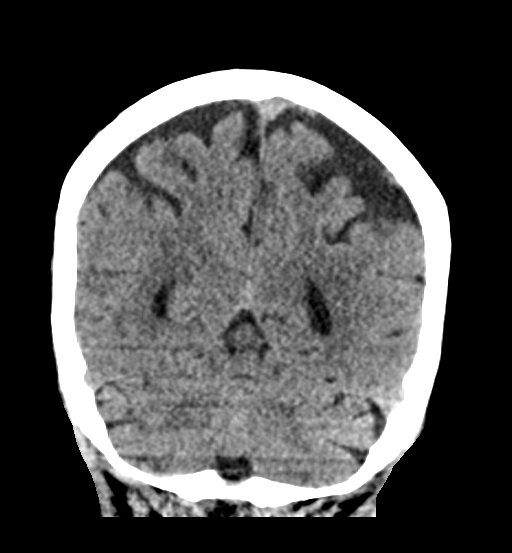
[im 33/74  brain]
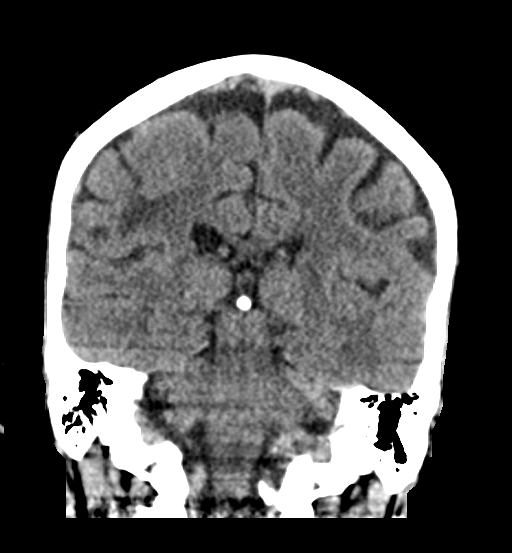
[im 41/74  brain]
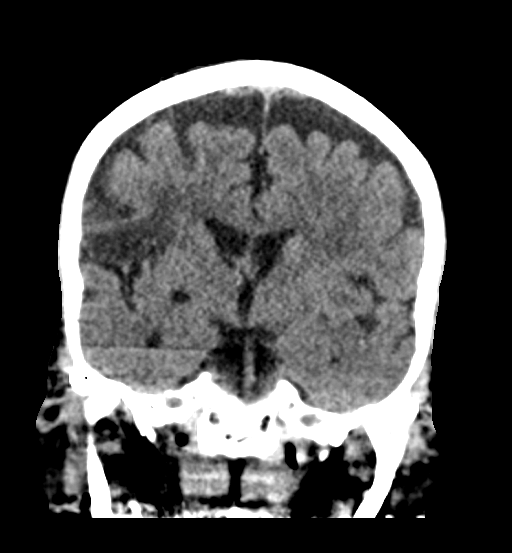

[Series 5: sagittal soft tissue · sagittal · 0.35mm/px · 3 of 56 slices shown]
[im 19/56  brain]
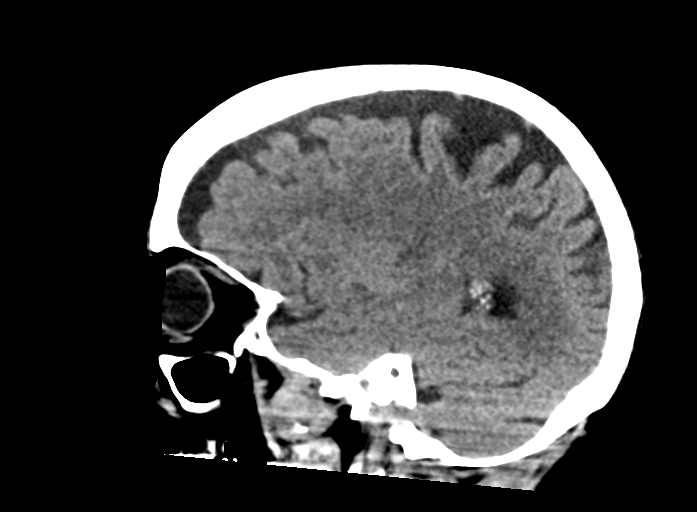
[im 28/56  brain]
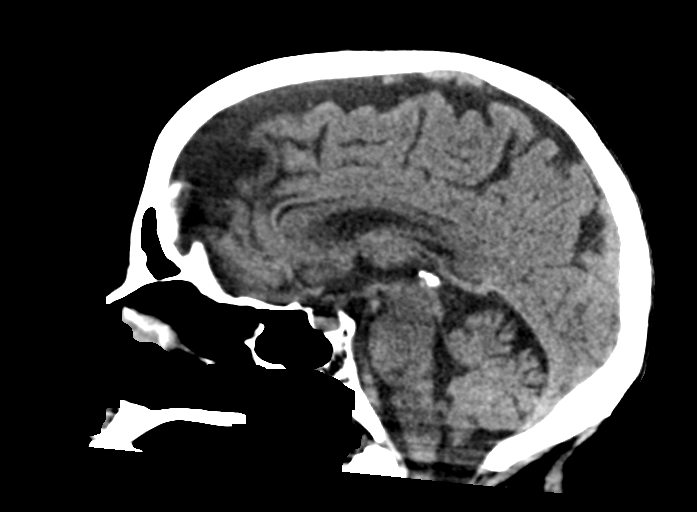
[im 37/56  brain]
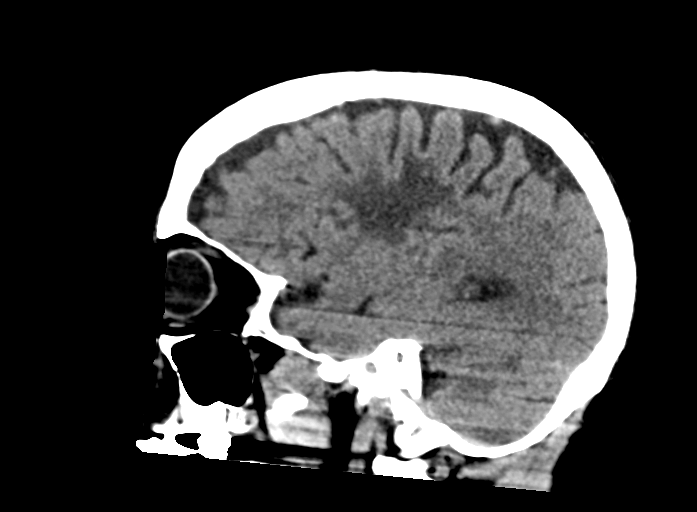

[15 of 47 positions shown; findings below may reference images not displayed]

FINDINGS: Brain: There is moderate brain atrophy and chronic microvascular
ischemic changes. An area of old infarct and encephalomalacia noted
in the right frontal lobe. Small bilateral basal ganglia old lacunar
infarct noted. There is no acute intracranial hemorrhage. No mass
effect or midline shift noted. No extra-axial fluid collection.

Vascular: No hyperdense vessel or unexpected calcification.

Skull: Normal. Negative for fracture or focal lesion.

Sinuses/Orbits: No acute finding.

Other: None
IMPRESSION: 1. No acute intracranial hemorrhage.
2. Moderate age-related atrophy and chronic microvascular ischemic
changes. Old right frontal infarct.

## 2017-09-22 IMAGING — CT CT ANGIO HEAD
1 of 12 series · 5 of 33 positions shown · IV contrast (isovue)
Comparison: Head MRI earlier today. Head MRA 05/05/2016. Carotid
Doppler 04/10/2016.

CLINICAL DATA: Small bilateral embolic infarcts on MRI earlier
today. New onset seizures.

EXAM:
CT ANGIOGRAPHY HEAD AND NECK
TECHNIQUE: Multidetector CT imaging of the head and neck was performed using
the standard protocol during bolus administration of intravenous
contrast. Multiplanar CT image reconstructions and MIPs were
obtained to evaluate the vascular anatomy. Carotid stenosis
measurements (when applicable) are obtained utilizing NASCET
criteria, using the distal internal carotid diameter as the
denominator.
CONTRAST:  50 mL Isovue 370

[Series 7: cta neck axial · axial · 0.39mm/px · z∈[-295,-54]mm · 5 of 358 slices shown]
[im 60/358  soft-tissue]
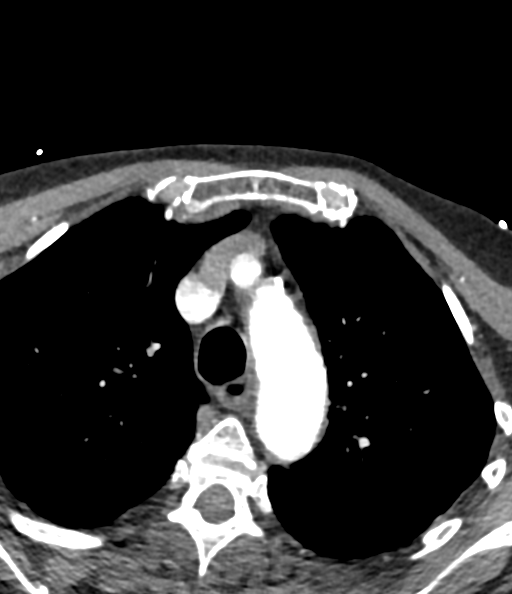
[im 120/358  bone]
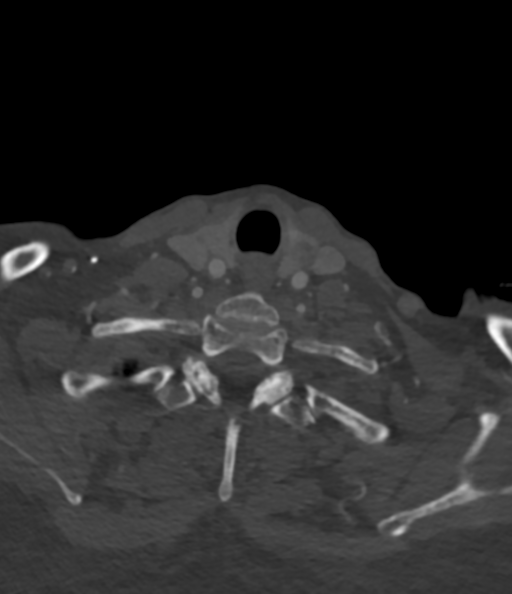
[im 179/358  soft-tissue]
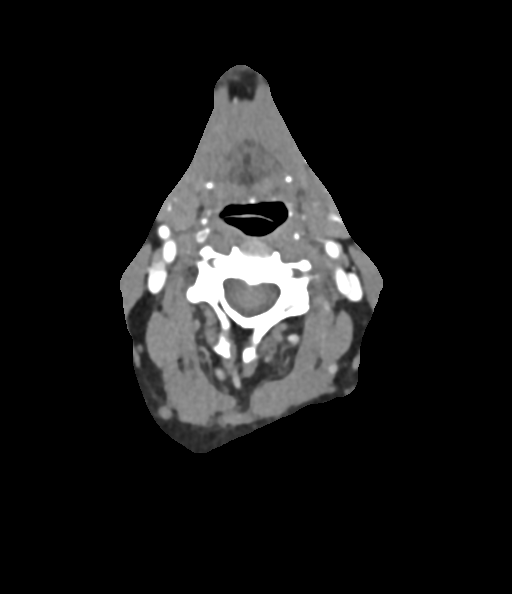
[im 239/358  bone]
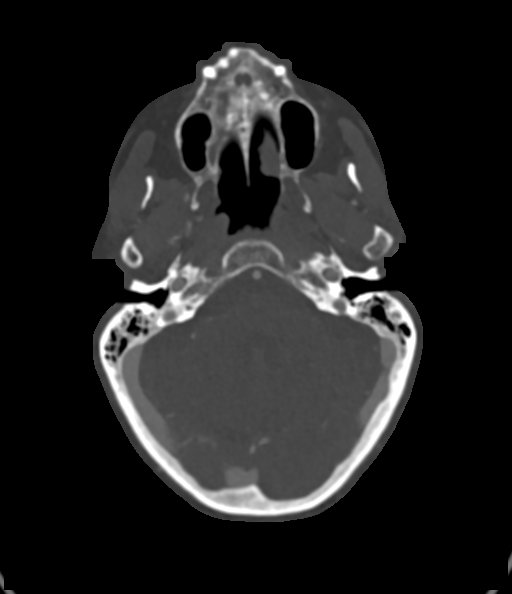
[im 298/358  soft-tissue]
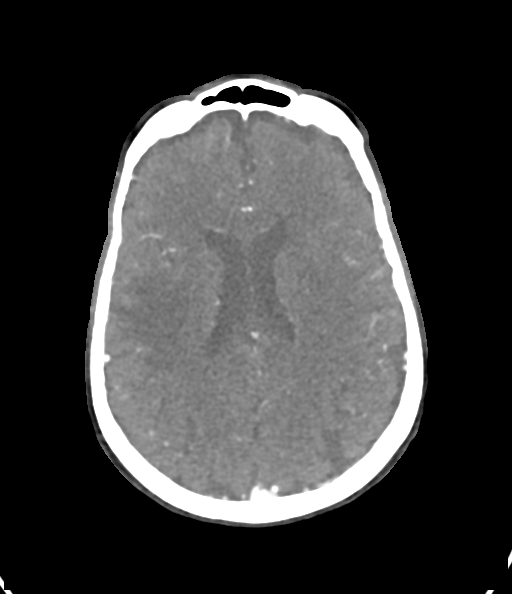

[5 of 33 positions shown; findings below may reference images not displayed]

FINDINGS: CT HEAD FINDINGS

Brain: The tiny bilateral embolic infarcts on today's earlier MRI
are not well seen by CT. A chronic posterior right frontal infarct
is again noted in the operculum. There are dilated perivascular
spaces versus chronic lacunar infarcts in the basal ganglia
bilaterally. Chronic infarcts are also noted in the right cerebellum
and left thalamus. No acute large territory infarct, intracranial
hemorrhage, mass, midline shift, or extra-axial fluid collection is
identified. Mild cerebral atrophy and mild chronic small vessel
ischemic white matter disease are noted.

Vascular: Calcified atherosclerosis at the skullbase.

Skull: No fracture focal osseous lesion.

Sinuses: Paranasal sinuses and mastoid air cells are clear.

Orbits: Bilateral cataract extraction.

Review of the MIP images confirms the above findings

CTA NECK FINDINGS

Aortic arch: Standard 3 vessel aortic arch with moderate
atherosclerotic plaque. Widely patent brachiocephalic and right
subclavian arteries. Approximately 50% stenosis of the left
subclavian artery at its origin due to calcified and soft plaque.

Right carotid system: Patent with moderate calcified plaque at the
carotid bifurcation resulting in moderate to severe ECA origin
stenosis. No significant common or internal carotid artery stenosis.

Left carotid system: Mild calcified plaque at the common carotid
artery origin without definite significant stenosis identified
within limitations of motion artifact. Moderate calcified plaque at
the carotid bifurcation results in approximately 75% ICA origin
stenosis. There is also moderate ECA origin stenosis.

Vertebral arteries: Patent and codominant without evidence of
significant stenosis.

Skeleton: Advanced cervical disc and facet degeneration. Grade 1
retrolisthesis of C3 on C4 and C5 on C6 and grade 1 anterolisthesis
of C4 on C5 and C7 on T1.

Other neck: Partially calcified 1 cm left thyroid nodule.

Upper chest: Clear lung apices.

Review of the MIP images confirms the above findings

CTA HEAD FINDINGS

Anterior circulation: The internal carotid arteries are patent from
skullbase to carotid termini with relatively diffuse siphon
atherosclerosis. There is mild supraclinoid stenosis bilaterally.
ACAs and MCAs are patent without evidence of proximal branch
occlusion or significant proximal stenosis. No aneurysm.

Posterior circulation: Intracranial vertebral arteries are patent to
the basilar. There is left greater than right V4 segment
atherosclerotic plaque which results in mild stenosis on the left.
Patent bilateral PICAs and SCAs. Basilar artery is patent without
stenosis. Posterior communicating arteries are not identified. PCAs
are patent with a moderate to severe P2 stenosis noted on the right,
not significantly changed from the prior MRA. No significant
proximal left PCA stenosis. No aneurysm.

Venous sinuses: Patent.

Anatomic variants: None.

Delayed phase: No abnormal enhancement.

Review of the MIP images confirms the above findings
IMPRESSION: 1. Intracranial atherosclerosis without proximal branch occlusion.
Mild bilateral intracranial ICA stenosis and moderate to severe
right P2 PCA stenosis.
2. 75% proximal left ICA stenosis.
3. Moderate to severe right and moderate left external carotid
artery origin stenosis.
4. 50% proximal left subclavian artery stenosis.
5.  Aortic Atherosclerosis ([id number]).

## 2017-09-22 IMAGING — MR MR HEAD W/O CM
9 of 10 series · 35 of 48 positions shown · non-contrast
Comparison: Head CT 02/28/2017 and MRI 05/05/2016

CLINICAL DATA: New onset seizures.  Mild left hemiparesis.

EXAM:
MRI HEAD WITHOUT CONTRAST
TECHNIQUE: Multiplanar, multiecho pulse sequences of the brain and surrounding
structures were obtained without intravenous contrast.

[Series 3: T1 · sagittal · 5.0mm · 0.47mm/px · 1 of 21 slices shown]
[im 1/21]
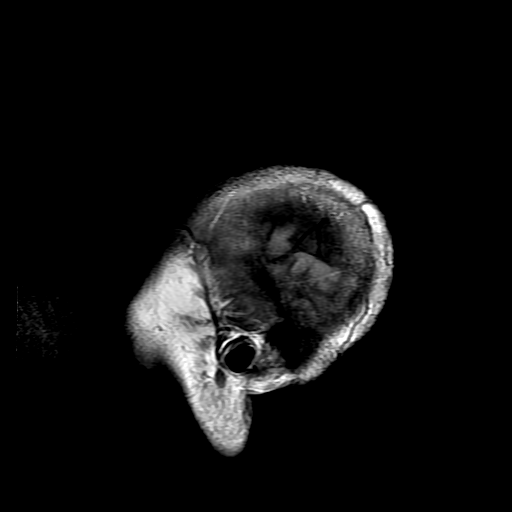

[Series 4: DWI · axial · 3.0mm · 1.09mm/px · z∈[-47,+97]mm · 8 of 100 slices shown (1 of 4)]
[im 1/100]
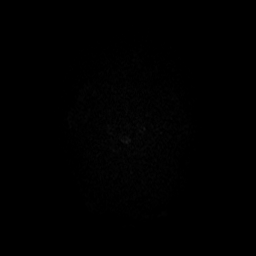
[im 12/100]
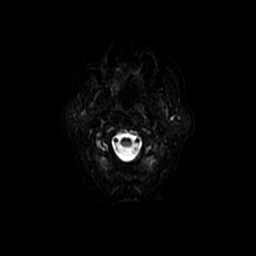
[im 34/100]
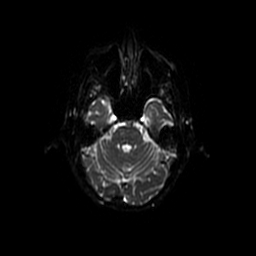
[im 45/100]
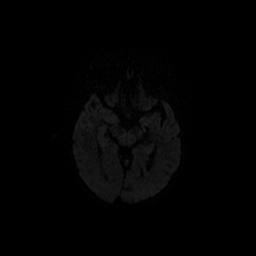
[im 56/100]
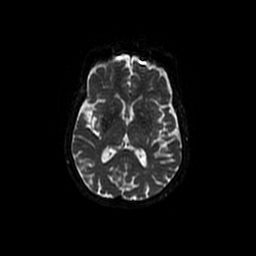
[im 67/100]
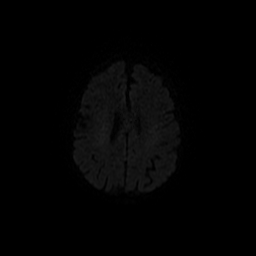
[im 89/100]
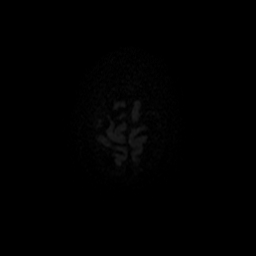
[im 100/100]
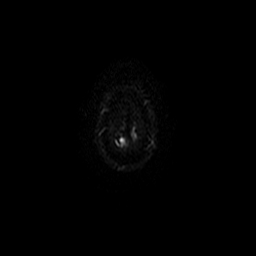

[Series 5: T2 · axial · 5.0mm · 0.43mm/px · z∈[-52,+89]mm · 3 of 25 slices shown (1 of 2)]
[im 1/25]
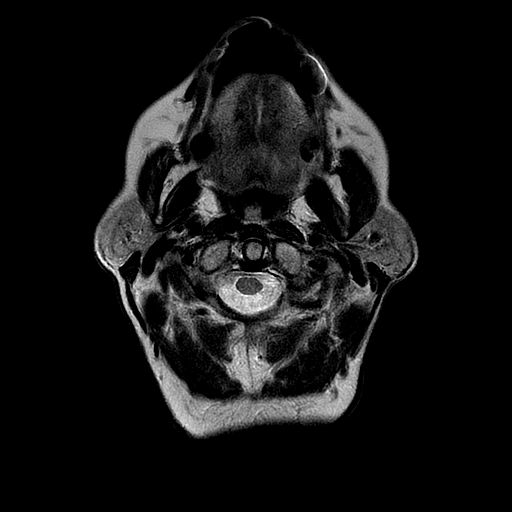
[im 13/25]
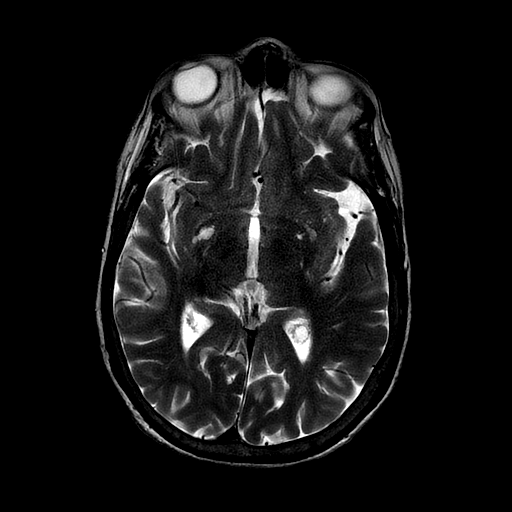
[im 25/25]
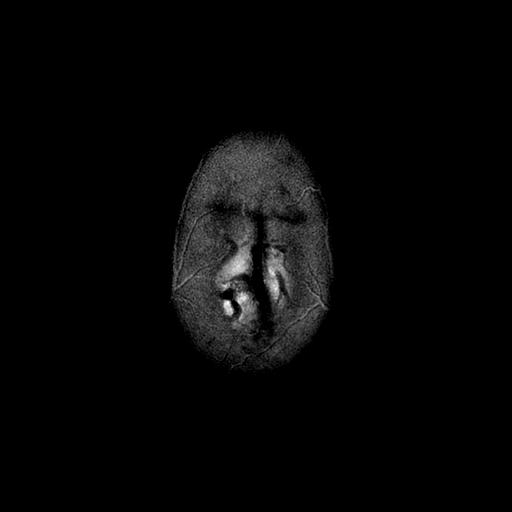

[Series 6: FLAIR · axial · 5.0mm · 0.43mm/px · z∈[-52,+89]mm · 3 of 25 slices shown]
[im 1/25]
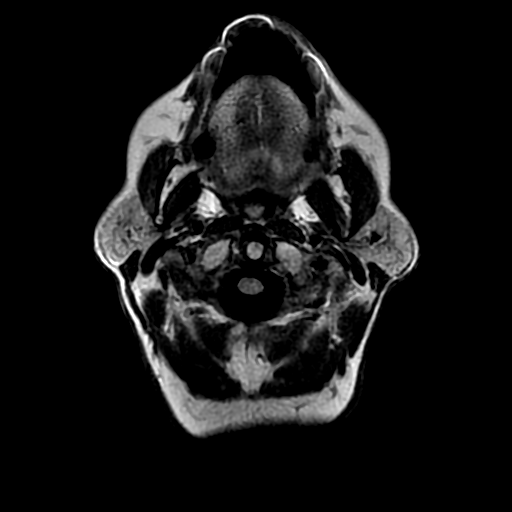
[im 13/25]
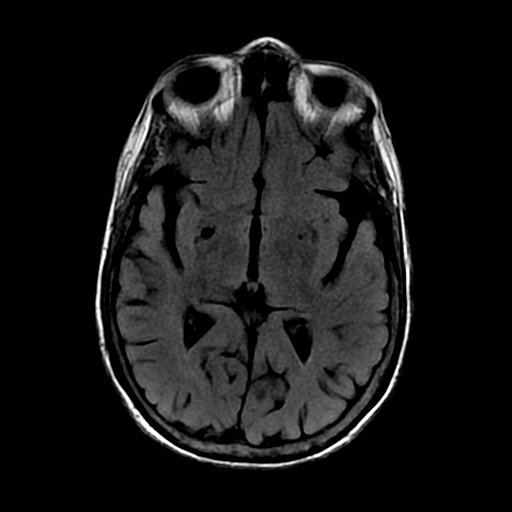
[im 25/25]
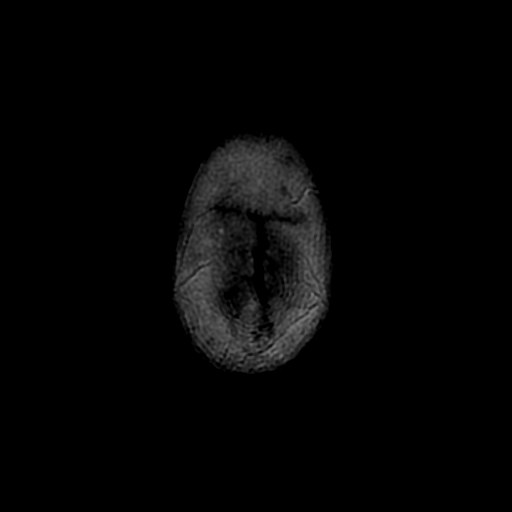

[Series 7: DWI · coronal · 5.0mm · 1.09mm/px · 7 of 66 slices shown (2 of 4)]
[im 1/66]
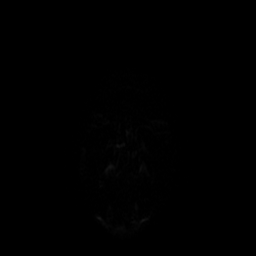
[im 11/66]
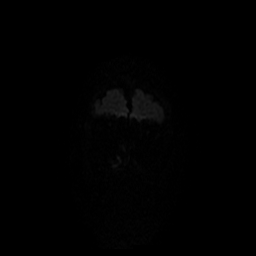
[im 22/66]
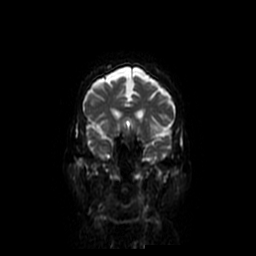
[im 33/66]
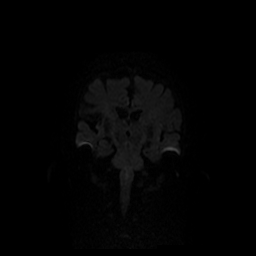
[im 44/66]
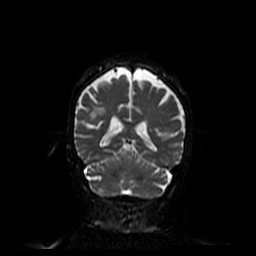
[im 55/66]
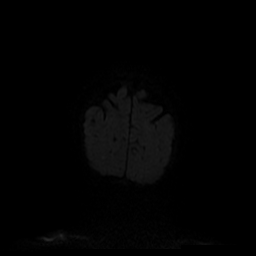
[im 66/66]
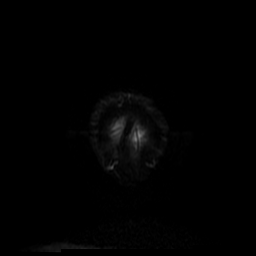

[Series 8: ax mpgr · axial · 5.0mm · 0.43mm/px · z∈[-52,+18]mm · 2 of 25 slices shown]
[im 1/25]
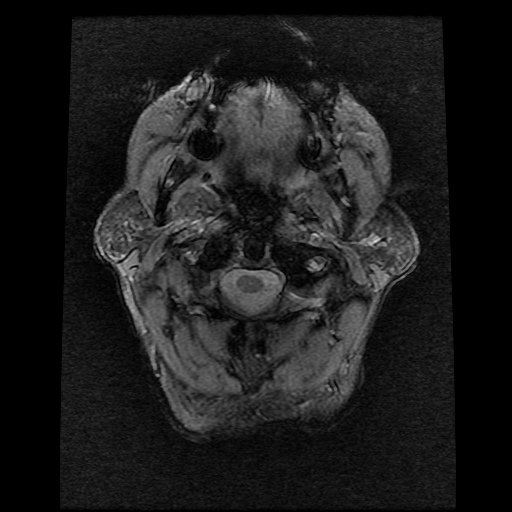
[im 13/25]
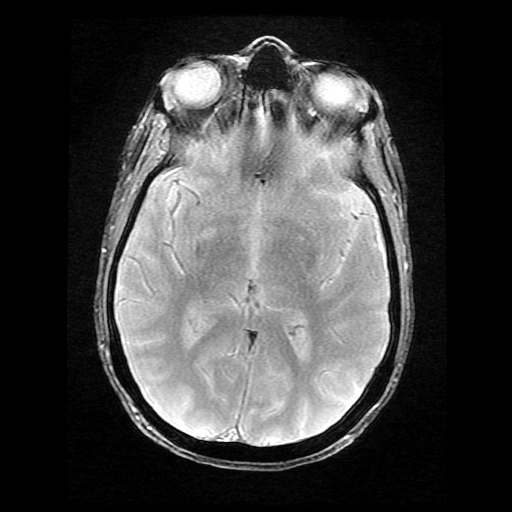

[Series 10: T2 · coronal · 5.0mm · 0.43mm/px · 3 of 28 slices shown (2 of 2)]
[im 1/28]
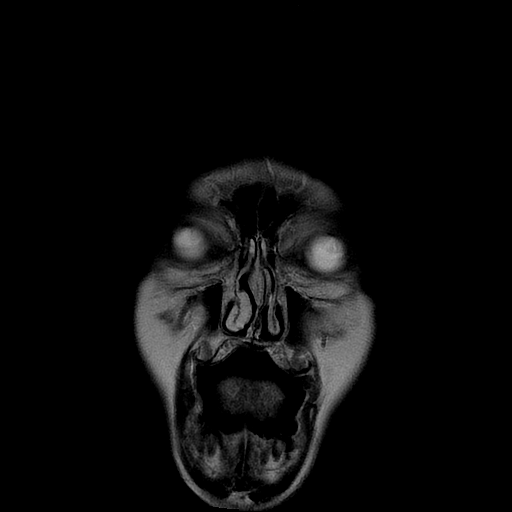
[im 14/28]
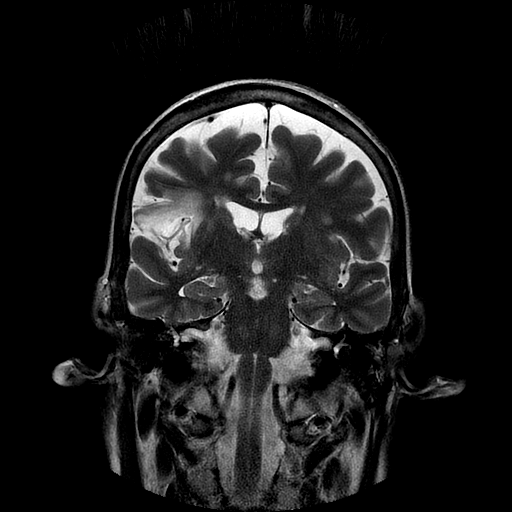
[im 28/28]
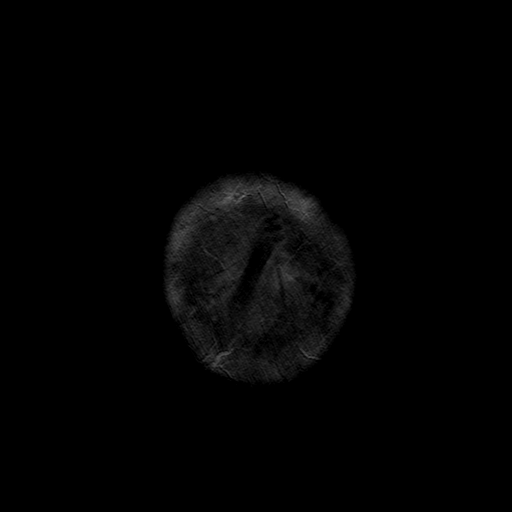

[Series 400: DWI · axial · 3.0mm · 1.09mm/px · z∈[-47,+97]mm · 5 of 50 slices shown (3 of 4)]
[im 1/50]
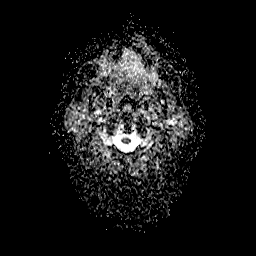
[im 13/50]
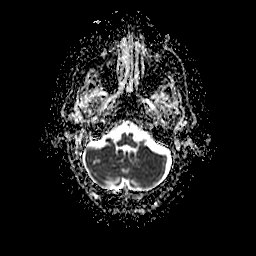
[im 25/50]
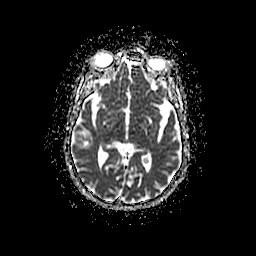
[im 37/50]
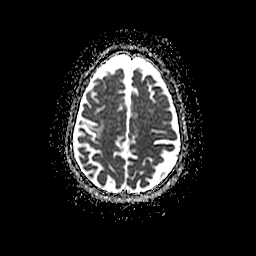
[im 50/50]
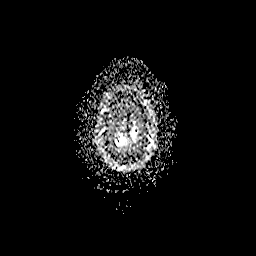

[Series 700: DWI · coronal · 5.0mm · 1.09mm/px · 3 of 33 slices shown (4 of 4)]
[im 1/33]
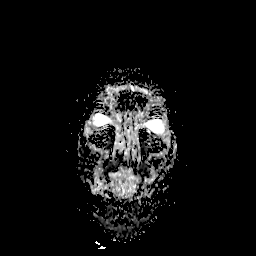
[im 17/33]
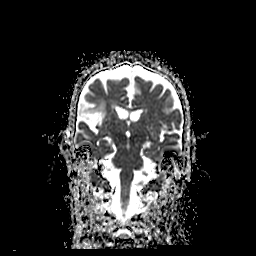
[im 33/33]
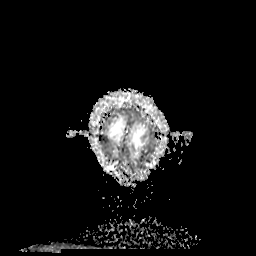

[35 of 48 positions shown; findings below may reference images not displayed]

FINDINGS: Brain: Punctate foci of hyperintense diffusion weighted signal are
present in the ventral right thalamus, posterior left corona
radiata, and left precentral gyrus. Reduced ADC cannot be confirmed
due to their small size. There is no acute large territory infarct.
A chronic right MCA infarct is again noted involving the frontal
operculum and insula. Small chronic infarcts are again seen in the
right cerebellum and left thalamus. Chronic lacunar infarcts versus
dilated perivascular spaces are present in the basal ganglia
bilaterally. Small T2 hyperintensities scattered elsewhere in the
cerebral white matter and pons are unchanged from the prior MRI and
nonspecific but compatible with mild chronic small vessel ischemic
disease. No intracranial hemorrhage, mass, midline shift, or
extra-axial fluid collection is identified. There is mild cerebral
atrophy.

Vascular: Major intracranial vascular flow voids are preserved.

Skull and upper cervical spine: Unremarkable bone marrow signal.

Sinuses/Orbits: Bilateral cataract extraction. Clear paranasal
sinuses. Small bilateral mastoid effusions.

Other: None.
IMPRESSION: 1. Punctate foci of abnormal diffusion signal in the left precentral
gyrus, left corona radiata, and right thalamus compatible with
acute/subacute embolic infarcts.
2. Chronic right frontal infarct and small vessel ischemic changes
as above.

## 2017-09-23 DIAGNOSIS — I10 Essential (primary) hypertension: Secondary | ICD-10-CM | POA: Diagnosis not present

## 2017-09-23 DIAGNOSIS — J309 Allergic rhinitis, unspecified: Secondary | ICD-10-CM | POA: Diagnosis not present

## 2017-09-23 DIAGNOSIS — S32402A Unspecified fracture of left acetabulum, initial encounter for closed fracture: Secondary | ICD-10-CM | POA: Diagnosis not present

## 2017-09-23 DIAGNOSIS — Z1389 Encounter for screening for other disorder: Secondary | ICD-10-CM | POA: Diagnosis not present

## 2017-09-23 DIAGNOSIS — E782 Mixed hyperlipidemia: Secondary | ICD-10-CM | POA: Diagnosis not present

## 2017-09-23 IMAGING — DX DG PORTABLE PELVIS
1 series · 1 of 1 positions shown · non-contrast
Comparison: Intraoperative exam 03/02/2017 and preoperative exam
02/28/2017.

CLINICAL DATA: 75-year-old female post left femur open reduction
and internal fixation. Subsequent encounter.

EXAM:
PORTABLE PELVIS 1-2 VIEWS

[pelvis]
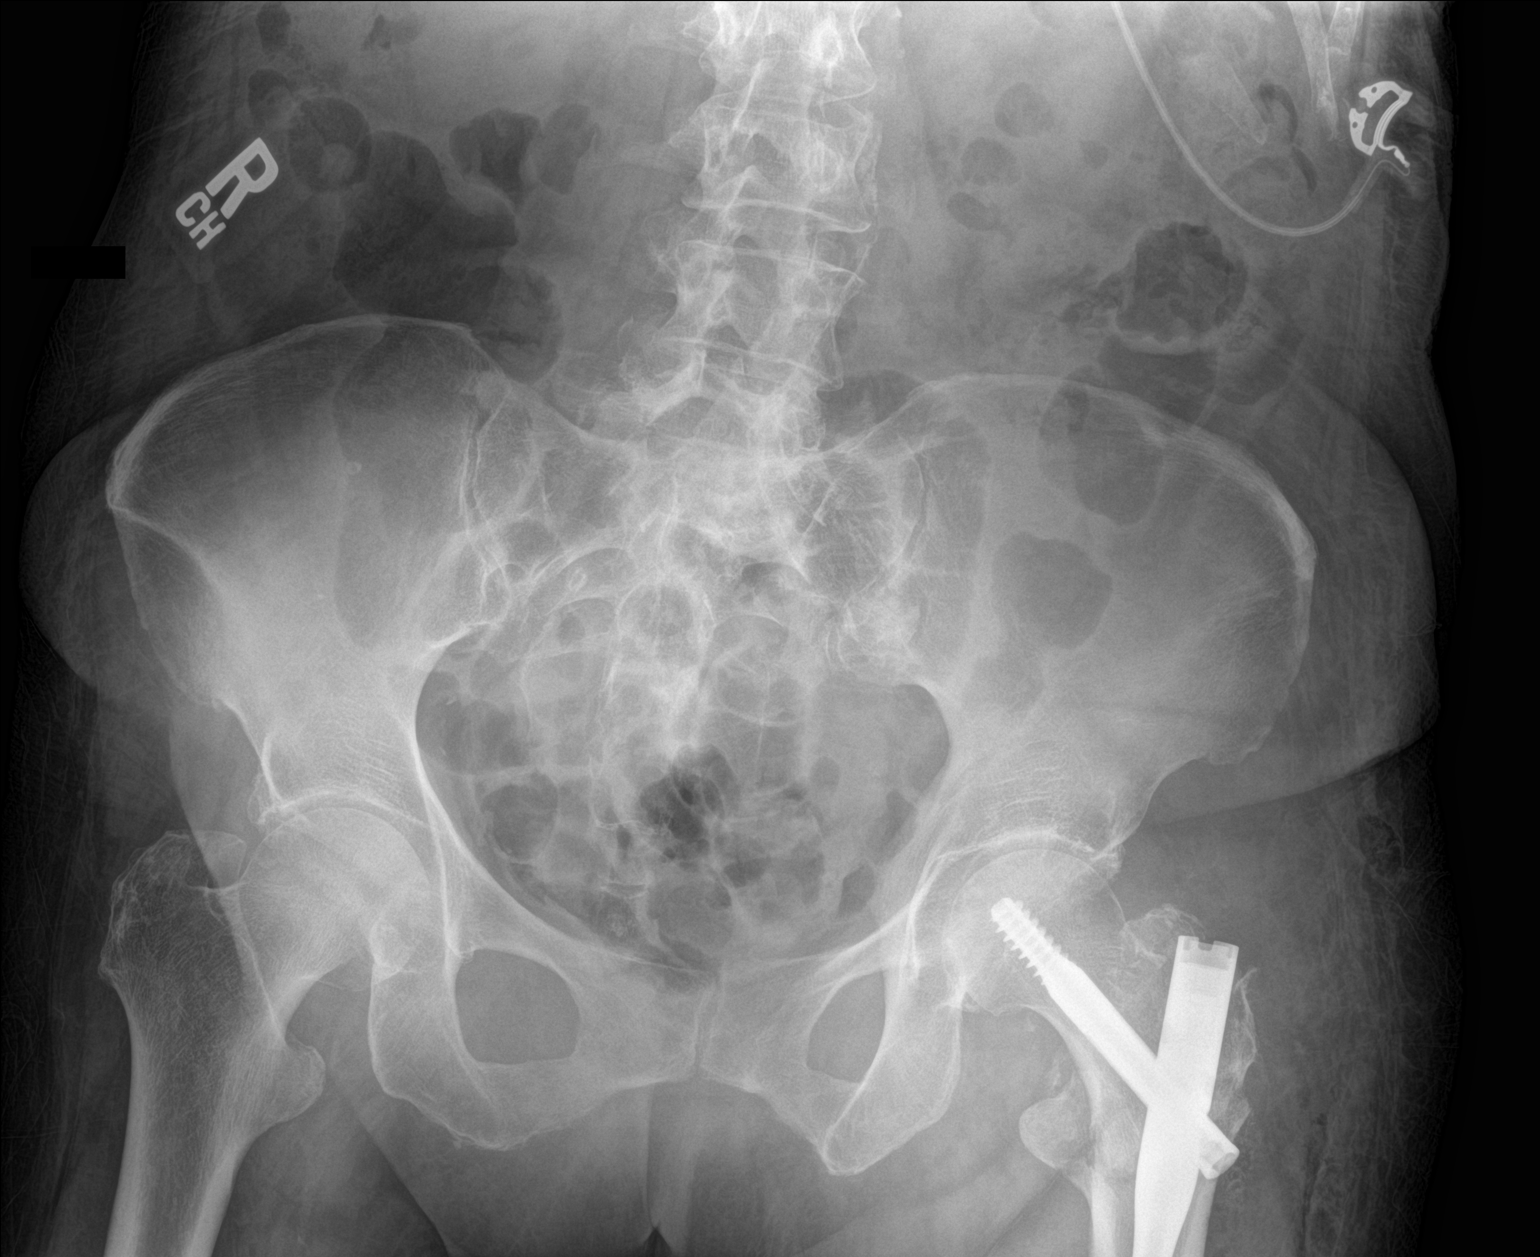

[1 of 1 positions shown; findings below may reference images not displayed]

FINDINGS: Mild rotation to the left. Left acetabulum/ pelvic wall better
delineated on 03/02/2017 femur films.

Post left intertrochanteric open reduction and internal fixation
without complicating feature noted.

Vascular calcifications.
IMPRESSION: Post left intertrochanteric fracture reduction.  Please see above.

Aortic atherosclerosis.

## 2017-09-24 DIAGNOSIS — D649 Anemia, unspecified: Secondary | ICD-10-CM | POA: Diagnosis not present

## 2017-09-24 DIAGNOSIS — M5136 Other intervertebral disc degeneration, lumbar region: Secondary | ICD-10-CM | POA: Diagnosis not present

## 2017-09-24 DIAGNOSIS — K7581 Nonalcoholic steatohepatitis (NASH): Secondary | ICD-10-CM | POA: Diagnosis not present

## 2017-09-24 DIAGNOSIS — I10 Essential (primary) hypertension: Secondary | ICD-10-CM | POA: Diagnosis not present

## 2017-09-24 DIAGNOSIS — I2510 Atherosclerotic heart disease of native coronary artery without angina pectoris: Secondary | ICD-10-CM | POA: Diagnosis not present

## 2017-09-24 DIAGNOSIS — E1151 Type 2 diabetes mellitus with diabetic peripheral angiopathy without gangrene: Secondary | ICD-10-CM | POA: Diagnosis not present

## 2017-09-24 DIAGNOSIS — Z9181 History of falling: Secondary | ICD-10-CM | POA: Diagnosis not present

## 2017-09-24 DIAGNOSIS — Z8673 Personal history of transient ischemic attack (TIA), and cerebral infarction without residual deficits: Secondary | ICD-10-CM | POA: Diagnosis not present

## 2017-09-24 DIAGNOSIS — S72302D Unspecified fracture of shaft of left femur, subsequent encounter for closed fracture with routine healing: Secondary | ICD-10-CM | POA: Diagnosis not present

## 2017-09-24 DIAGNOSIS — G4733 Obstructive sleep apnea (adult) (pediatric): Secondary | ICD-10-CM | POA: Diagnosis not present

## 2017-09-24 DIAGNOSIS — I350 Nonrheumatic aortic (valve) stenosis: Secondary | ICD-10-CM | POA: Diagnosis not present

## 2017-09-28 DIAGNOSIS — S72302D Unspecified fracture of shaft of left femur, subsequent encounter for closed fracture with routine healing: Secondary | ICD-10-CM | POA: Diagnosis not present

## 2017-09-28 DIAGNOSIS — I350 Nonrheumatic aortic (valve) stenosis: Secondary | ICD-10-CM | POA: Diagnosis not present

## 2017-09-28 DIAGNOSIS — Z8673 Personal history of transient ischemic attack (TIA), and cerebral infarction without residual deficits: Secondary | ICD-10-CM | POA: Diagnosis not present

## 2017-09-28 DIAGNOSIS — E1151 Type 2 diabetes mellitus with diabetic peripheral angiopathy without gangrene: Secondary | ICD-10-CM | POA: Diagnosis not present

## 2017-09-28 DIAGNOSIS — G4733 Obstructive sleep apnea (adult) (pediatric): Secondary | ICD-10-CM | POA: Diagnosis not present

## 2017-09-28 DIAGNOSIS — K7581 Nonalcoholic steatohepatitis (NASH): Secondary | ICD-10-CM | POA: Diagnosis not present

## 2017-09-28 DIAGNOSIS — I10 Essential (primary) hypertension: Secondary | ICD-10-CM | POA: Diagnosis not present

## 2017-09-28 DIAGNOSIS — I2510 Atherosclerotic heart disease of native coronary artery without angina pectoris: Secondary | ICD-10-CM | POA: Diagnosis not present

## 2017-09-28 DIAGNOSIS — Z9181 History of falling: Secondary | ICD-10-CM | POA: Diagnosis not present

## 2017-09-28 DIAGNOSIS — M5136 Other intervertebral disc degeneration, lumbar region: Secondary | ICD-10-CM | POA: Diagnosis not present

## 2017-09-28 DIAGNOSIS — D649 Anemia, unspecified: Secondary | ICD-10-CM | POA: Diagnosis not present

## 2017-09-29 DIAGNOSIS — S32512A Fracture of superior rim of left pubis, initial encounter for closed fracture: Secondary | ICD-10-CM | POA: Diagnosis not present

## 2017-09-29 DIAGNOSIS — S32592A Other specified fracture of left pubis, initial encounter for closed fracture: Secondary | ICD-10-CM | POA: Diagnosis not present

## 2017-09-29 DIAGNOSIS — M25552 Pain in left hip: Secondary | ICD-10-CM | POA: Diagnosis not present

## 2017-09-30 DIAGNOSIS — Z8673 Personal history of transient ischemic attack (TIA), and cerebral infarction without residual deficits: Secondary | ICD-10-CM | POA: Diagnosis not present

## 2017-09-30 DIAGNOSIS — I2510 Atherosclerotic heart disease of native coronary artery without angina pectoris: Secondary | ICD-10-CM | POA: Diagnosis not present

## 2017-09-30 DIAGNOSIS — K7581 Nonalcoholic steatohepatitis (NASH): Secondary | ICD-10-CM | POA: Diagnosis not present

## 2017-09-30 DIAGNOSIS — G4733 Obstructive sleep apnea (adult) (pediatric): Secondary | ICD-10-CM | POA: Diagnosis not present

## 2017-09-30 DIAGNOSIS — M5136 Other intervertebral disc degeneration, lumbar region: Secondary | ICD-10-CM | POA: Diagnosis not present

## 2017-09-30 DIAGNOSIS — I350 Nonrheumatic aortic (valve) stenosis: Secondary | ICD-10-CM | POA: Diagnosis not present

## 2017-09-30 DIAGNOSIS — S72302D Unspecified fracture of shaft of left femur, subsequent encounter for closed fracture with routine healing: Secondary | ICD-10-CM | POA: Diagnosis not present

## 2017-09-30 DIAGNOSIS — E1151 Type 2 diabetes mellitus with diabetic peripheral angiopathy without gangrene: Secondary | ICD-10-CM | POA: Diagnosis not present

## 2017-09-30 DIAGNOSIS — D649 Anemia, unspecified: Secondary | ICD-10-CM | POA: Diagnosis not present

## 2017-09-30 DIAGNOSIS — Z9181 History of falling: Secondary | ICD-10-CM | POA: Diagnosis not present

## 2017-09-30 DIAGNOSIS — I10 Essential (primary) hypertension: Secondary | ICD-10-CM | POA: Diagnosis not present

## 2017-10-02 ENCOUNTER — Other Ambulatory Visit (INDEPENDENT_AMBULATORY_CARE_PROVIDER_SITE_OTHER): Payer: Self-pay | Admitting: *Deleted

## 2017-10-02 ENCOUNTER — Encounter (INDEPENDENT_AMBULATORY_CARE_PROVIDER_SITE_OTHER): Payer: Self-pay | Admitting: *Deleted

## 2017-10-02 DIAGNOSIS — K7469 Other cirrhosis of liver: Secondary | ICD-10-CM

## 2017-10-09 ENCOUNTER — Other Ambulatory Visit: Payer: Self-pay | Admitting: *Deleted

## 2017-10-09 NOTE — Patient Outreach (Signed)
[name]   10/09/2017  [name] 12-May-1941 [id number]   Subjective:  See notes below  Objective:   BP (!) 126/54   Pulse (!) 59   Temp (!) 96.8 F (36 C) (Axillary)   Resp 18   Ht 1.6 m (_0 )   Wt 125 lb (56.7 kg)   SpO2 98%   BMI 22.14 kg/m   Review of Systems  Constitutional: Negative for chills, diaphoresis, fever, malaise/fatigue and weight loss.  HENT: Positive for congestion. Negative for ear discharge, ear pain, hearing loss, nosebleeds, sinus pain, sore throat and tinnitus.   Eyes: Negative.  Negative for blurred vision, double vision, photophobia, pain, discharge and redness.  Respiratory: Negative for cough, hemoptysis, sputum production, shortness of breath, wheezing and stridor.   Cardiovascular: Negative for chest pain, palpitations, orthopnea, claudication and PND.  Gastrointestinal: Positive for constipation and nausea. Negative for abdominal pain, blood in stool, diarrhea, melena and vomiting.  Genitourinary: Negative.  Negative for dysuria, flank pain, frequency, hematuria and urgency.  Musculoskeletal: Positive for back pain and joint pain. Negative for falls, myalgias and neck pain.  Skin: Negative for itching and rash.  Neurological: Positive for sensory change and weakness. Negative for dizziness, tingling, tremors, speech change, focal weakness, seizures, loss of consciousness and headaches.       "every once in a while that left foot burns a little bit"   Endo/Heme/Allergies: Positive for environmental allergies. Bruises/bleeds easily.  Psychiatric/Behavioral: Positive for hallucinations. Negative for depression, memory loss, substance abuse and suicidal ideas. The patient is not nervous/anxious and does not have insomnia.        Son reports recent hallucination noted up at 0200-0300 "wanting [place] to mop the floor.  Had her days and nights mixed up"    Physical Exam  Encounter Medications:   Outpatient Encounter  Medications as of 10/09/2017  Medication Sig  . aspirin EC 81 MG tablet Take 81 mg by mouth daily.  Marland Kitchen atorvastatin (LIPITOR) 40 MG tablet Take 40 mg by mouth daily.  . carvedilol (COREG) 12.5 MG tablet Take 1 tablet (12.5 mg total) by mouth 2 (two) times daily with a meal.  . diltiazem (CARDIZEM CD) 120 MG 24 hr capsule Take 120 mg by mouth daily.  . DiphenhydrAMINE HCl, Sleep, (SLEEP AID) 25 MG CAPS Take 1-2 capsules by mouth at bedtime as needed (for sleep).  . docusate sodium (COLACE) 100 MG capsule Take 100 mg by mouth daily.   . furosemide (LASIX) 20 MG tablet Take 20 mg by mouth daily.  Marland Kitchen HYDROcodone-acetaminophen (NORCO) 5-325 MG tablet Take 1 tablet by mouth every 6 (six) hours as needed for moderate pain or severe pain.  Marland Kitchen HYDROcodone-acetaminophen (NORCO) 5-325 MG tablet Take 1-2 tablets by mouth every 4 (four) hours as needed.  . isosorbide mononitrate (IMDUR) 30 MG 24 hr tablet Take 30 mg by mouth daily.   Marland Kitchen lisinopril (PRINIVIL,ZESTRIL) 20 MG tablet Take 20 mg by mouth daily.  . ondansetron (ZOFRAN) 4 MG tablet Take 1 tablet (4 mg total) by mouth every 8 (eight) hours as needed for nausea or vomiting.  . pantoprazole (PROTONIX) 40 MG tablet Take 40 mg by mouth daily.  . phenytoin (DILANTIN) 200 MG ER capsule Take 1 capsule (200 mg total) by mouth daily. (Patient not taking: Reported on 09/15/2017)  . ranitidine (ZANTAC) 150 MG tablet Take 150 mg by mouth 2 (two) times daily.  . traMADol (ULTRAM) 50 MG tablet Take 50 mg by mouth 4 (  four) times daily. 10 day course starting on 09/10/17  . zolpidem (AMBIEN) 10 MG tablet take 1 tablet by mouth at bedtime if needed  . [DISCONTINUED] promethazine (PHENERGAN) 12.5 MG tablet Take 1 tablet (12.5 mg total) by mouth every 6 (six) hours as needed for nausea or vomiting. (Patient not taking: Reported on 12/25/2015)   No facility-administered encounter medications on file as of 10/09/2017.     Functional Status:   In your present state of health, do  you have any difficulty performing the following activities: 09/15/2017 08/24/2017  Hearing? N N  Vision? Y N  Difficulty concentrating or making decisions? N N  Walking or climbing stairs? Y Y  Dressing or bathing? Y N  Doing errands, shopping? Y N  Preparing Food and eating ? Y -  Using the Toilet? Y -  In the past six months, have you accidently leaked urine? N -  Do you have problems with loss of bowel control? N -  Managing your Medications? N -  Managing your Finances? N -  Housekeeping or managing your Housekeeping? Y -  Some recent data might be hidden    Fall/Depression Screening:    Fall Risk  09/15/2017 07/06/2017 03/09/2017  Falls in the past year? Yes Yes Yes  Number falls in past yr: 2 or more 2 or more 1  Injury with Fall? Yes Yes Yes  Risk Factor Category  High Fall Risk High Fall Risk -  Risk for fall due to : History of fall(s);Medication side effect;Impaired balance/gait;Impaired mobility;Impaired vision History of fall(s) Impaired balance/gait;Impaired mobility  Risk for fall due to: Comment - history of one fall in 2018 -  Follow up Falls evaluation completed;Education provided;Follow up appointment;Falls prevention discussed - Falls prevention discussed   PHQ 2/9 Scores 09/15/2017 07/06/2017 03/09/2017  PHQ - 2 Score 1 0 0    Assessment:    Met with [name] in her bedroom in her home for home visit. As CM entered her bedroom she was noted to be smoking a cigarette and put it out in the ash tray on her bed. Cm counseled her on smoking. She denies readiness to stop smoking and need of resources to assist. Since Cm last home visit she has been to see [name] as her primary MD, Dr Alvan Dame and Dr Aline Brochure preferred her to follow up with [name] on 09/29/17  She has also had an ED visit when she ran out of hydrocodone on 09/16/17 with attempts to use tramadol to resolve pain without success per pt. She was assisted with medication refill (norco) until she was able to get  to her appointment with [name]  Constipation hx She reports having a bm today "good one" Cm discussed the use of narcotics related to constipation and encouraged monitoring dosage with increase of vegetables and bowel elimination prn medications   Fracture of inferior and superior pubic ramus/pain in left hip (closed displaced fx of anterior column of the acetabulum She reports she is scheduled to see her orthopedic provider in [facility] [name] in 6 weeks and she is to remain on bedrest until that time She continues to have the support of her son, daughter in law/son's girlfriend and her grand daughters.  Her son has placed a bedside commode in her room to prevent her from further making attempts to get up out of bed by herself which has resulted in her previous injuries.  She reports she is still contacted by physical therapy st  intervals. Cm noted foot drop of her left extremity and discussed the use of a foot drop splint in which she does have but had not been using.  The family informed CM they would find it and apply it to her foot   Upcoming appointments Mrs Mings confirms she has not contacted her primary MD Dr Gerarda Fraction since she and Cm made attempts to interact with the office on 09/15/17, therefore, she has not made follow up appointments.  Cm encourage her to make contact with her primary MD office [name] to be seen for a 6 week follow up appointment in March 2019   Nutrition  Reports an intake of boost   Plan:  Follow up with Mrs Knack in 3-4 weeks Route note to MDs per Epic care team list [facility] CM Care Plan Problem One     Most Recent Value  Care Plan Problem One  foot drop  Role Documenting the Problem One  Care Management [place] for Problem One  Active  [facility] Long Term Goal   Patient wear her foot drop splint during her bedrest of 6 weeks as evidence of observing use of foot drop splint during home visit and patient/family verbalizing use   [facility] Long Term  Goal Start Date  10/09/17  Interventions for Problem One Long Term Goal  assess foot drop, discuss use of DME to assist and importance of wearing it, encouraged participation with use of foot drop splint from patient and family caregivers, re assess use of splint during home visits  [facility] CM Short Term Goal #1   over the next 21 days atient will find and place her foot splint on during her 6 week bedrest as evidence by observation of foot splint noted on patient, verbalization of use from patient and family  [facility] CM Short Term Goal #1 Start Date  10/09/17  Interventions for Short Term Goal #1  assess foot drop, discuss use of DME to assist and importance of wearing it, encouraged participation with use of foot drop splint from patient and family caregivers, re assess use of splint during home visits    [facility] CM Care Plan Problem Two     Most Recent Value  Care Plan Problem Two  Risk for hospital readmission secondary to recently diagnosed   Role Documenting the Problem Two  Care Management [facility]ph[facility] [place] for Problem Two  Active  Interventions for Problem Two Long Term Goal    Discussed with patient current clinical status, upcoming provider appointments,  medication reconciliation completed.  [facility] CCM TOC program initiated  [facility] Long Term Goal   Over the next 31 days, patient will not experience hospital readmission, as evidenced by patient reporting and review of EMR during [facility] RN CCM outreach  [place] Term Goal Start Date  09/15/17  [facility] Long Term Goal Met Date  10/09/17  [facility] CM Short Term Goal #1   Over the next 14 days, patient will schedule hospital follow up office visit appointments with orthopedic MD, as evidenced by patient reporting during [facility] RN CCM outreach  [facility] CM Short Term Goal #1 Start Date  09/15/17  [facility] CM Short Term Goal #1 Met Date   10/09/17  Interventions for Short Term Goal #2    Discussed with patient his plans to schedule hospital follow up appointments with  orthopedic MD,  encouraged patient to schedule appointments promptly, calls to MD offices (primary and orthopedic with patient during home visit  Kerriann Kamphuis L. Lavina Hamman, RN, BSN, [place] Coordinator [phone] week day mobile

## 2017-10-12 DIAGNOSIS — I69364 Other paralytic syndrome following cerebral infarction affecting left non-dominant side: Secondary | ICD-10-CM | POA: Diagnosis not present

## 2017-10-12 DIAGNOSIS — Z7409 Other reduced mobility: Secondary | ICD-10-CM | POA: Diagnosis not present

## 2017-10-14 IMAGING — DX DG KNEE COMPLETE 4+V*L*
4 series · 4 of 4 positions shown · non-contrast
Comparison: Left femur series 03/02/17.

CLINICAL DATA: 75-year-old female LEI status post hip fracture from
fall 3 weeks ago. Left knee swelling without pain.

EXAM:
LEFT KNEE - COMPLETE 4+ VIEW

[knee ap]
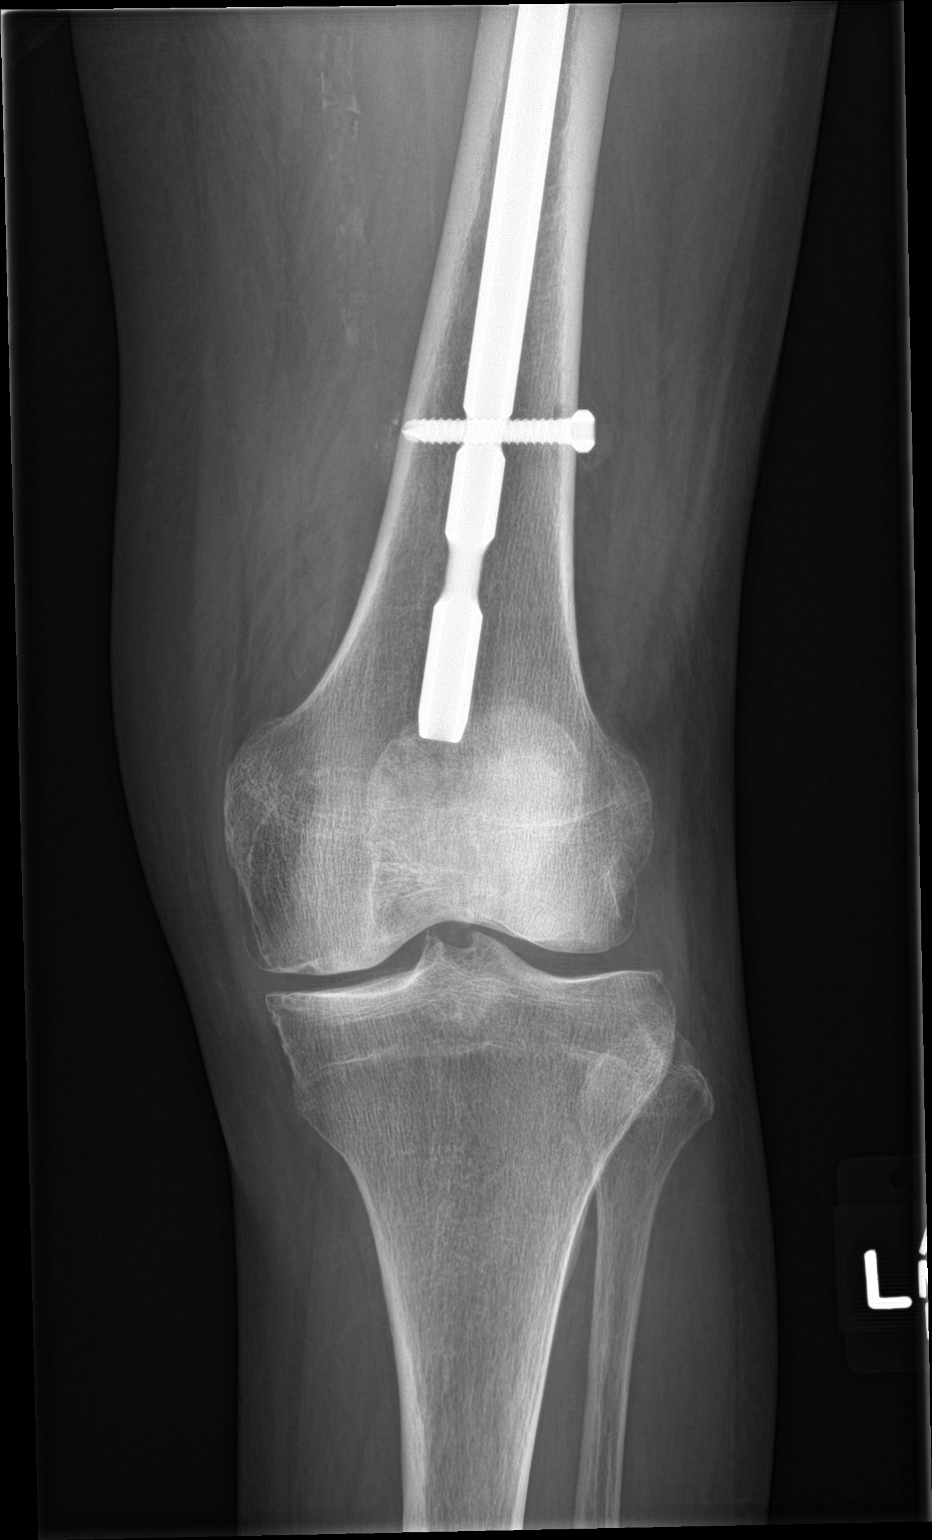

[knee obl (1 of 2)]
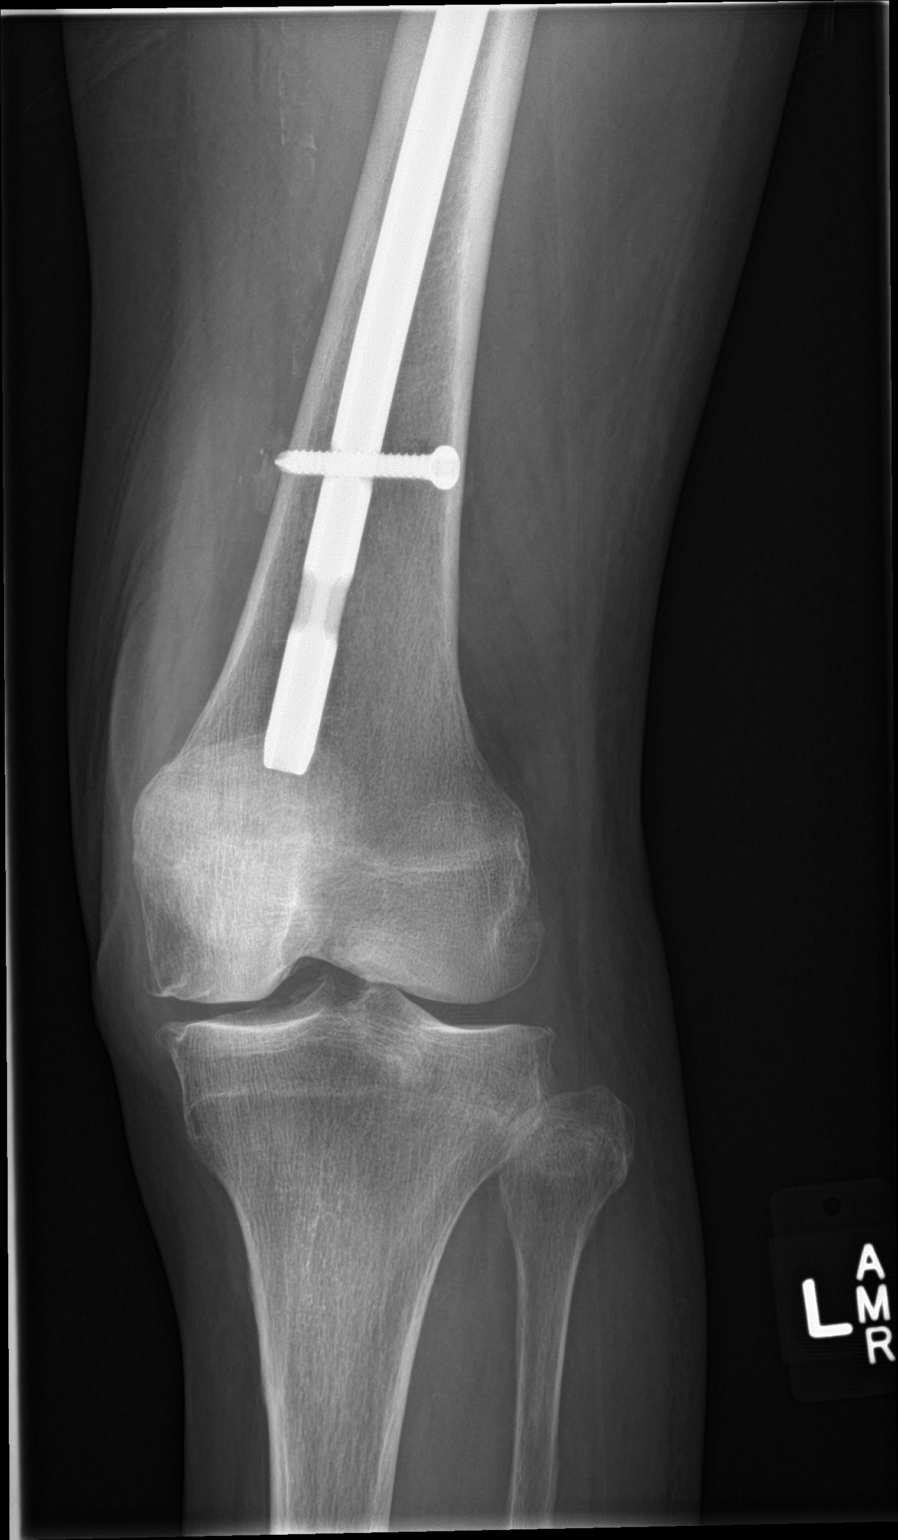

[knee obl (2 of 2)]
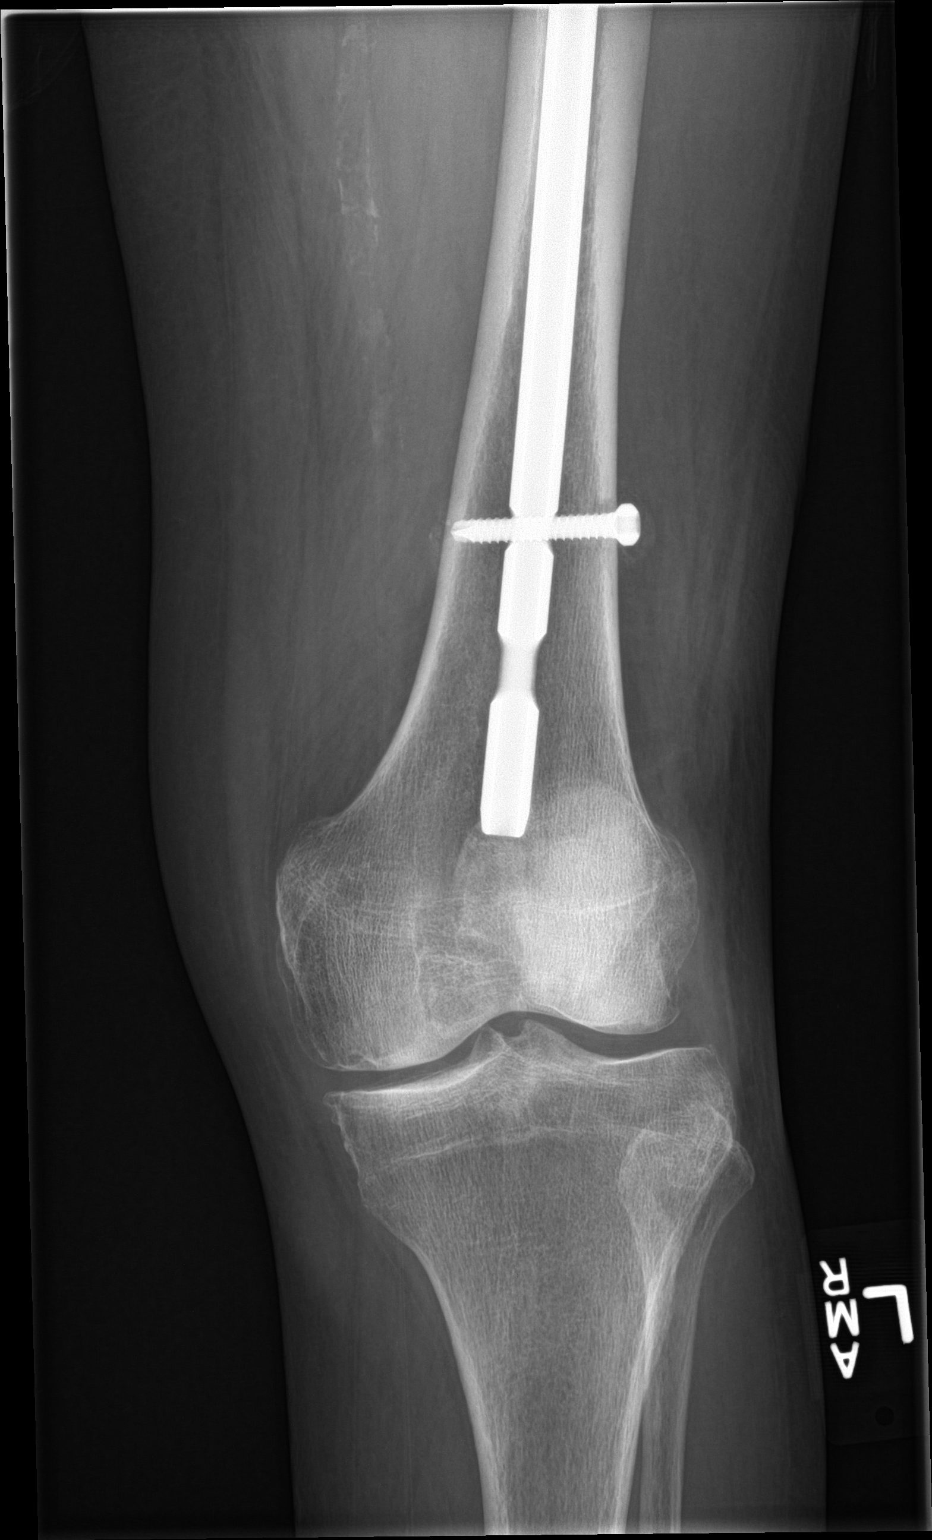

[knee lat]
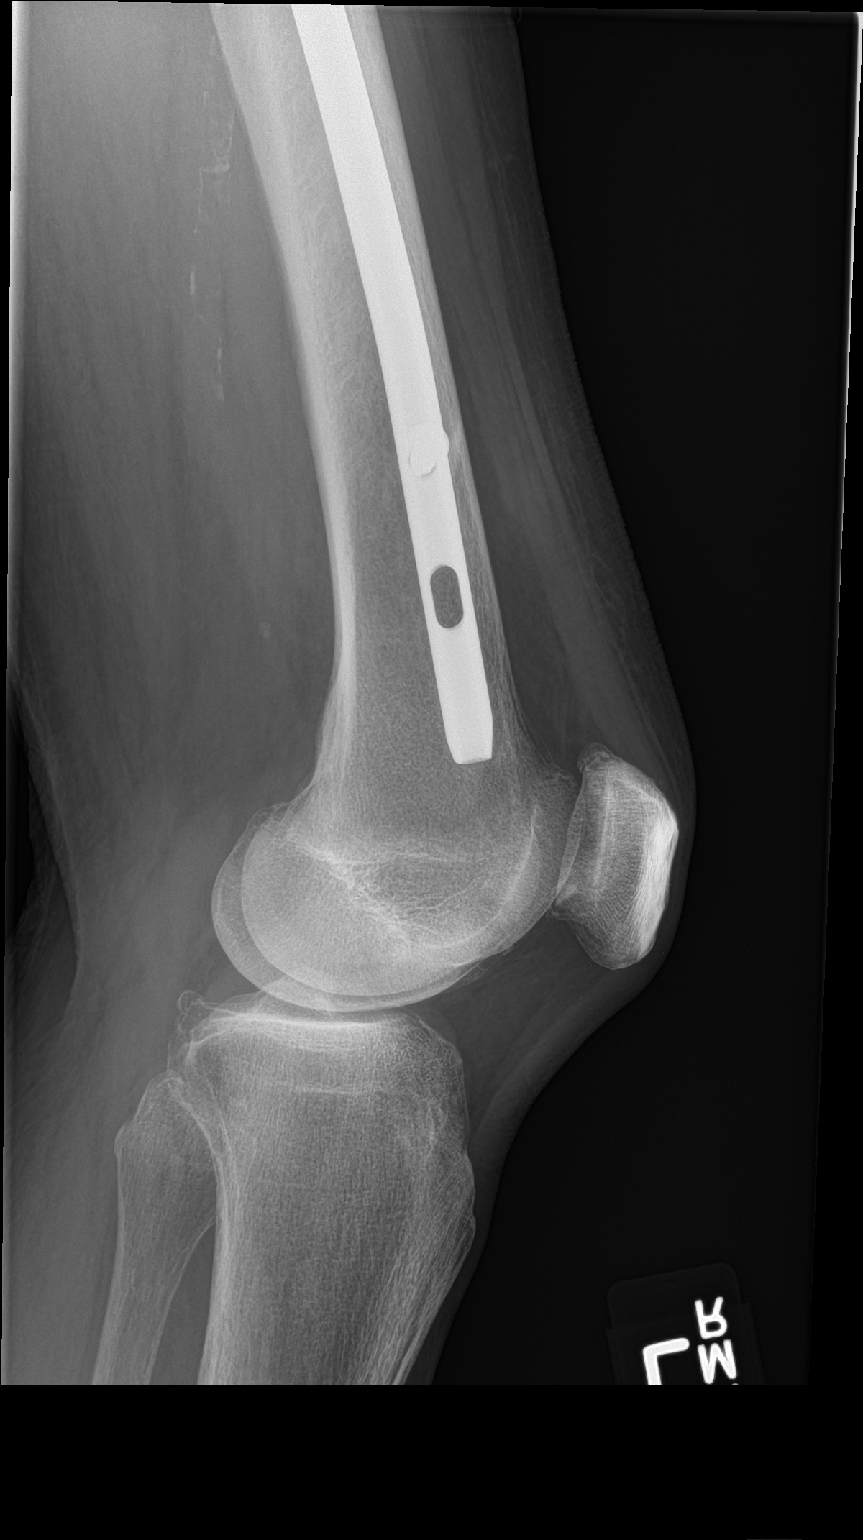

[4 of 4 positions shown; findings below may reference images not displayed]

FINDINGS: The distal aspect of the left femur intramedullary rod is
demonstrated with a distal interlocking cortical screw. Hardware
appears intact. The visible distal femur is intact. There is medial
and patellofemoral compartment joint space loss and osteophytosis.
No knee joint effusion identified. No acute osseous abnormality
identified. Calcified peripheral vascular disease.
IMPRESSION: 1.  No acute osseous abnormality identified.
2. Visualized distal femur ORIF hardware appears intact.
3. Medial and patellofemoral compartment degenerative changes at the
left knee.

## 2017-10-26 DIAGNOSIS — Z79899 Other long term (current) drug therapy: Secondary | ICD-10-CM | POA: Diagnosis not present

## 2017-10-26 DIAGNOSIS — E1140 Type 2 diabetes mellitus with diabetic neuropathy, unspecified: Secondary | ICD-10-CM | POA: Diagnosis not present

## 2017-10-26 DIAGNOSIS — I69344 Monoplegia of lower limb following cerebral infarction affecting left non-dominant side: Secondary | ICD-10-CM | POA: Diagnosis not present

## 2017-11-06 ENCOUNTER — Other Ambulatory Visit: Payer: Self-pay | Admitting: *Deleted

## 2017-11-06 NOTE — Patient Outreach (Signed)
[facility]) Care Management   11/06/2017  Stefanny Pieri 30-Dec-1940 [id number]   Subjective:  See notes below  Objective:   BP (!) 160/78   Pulse 84   Resp 20   SpO2 98%  Review of Systems  Constitutional: Negative for chills, diaphoresis, fever, malaise/fatigue and weight loss.  HENT: Negative.  Negative for congestion, ear discharge, ear pain, hearing loss, nosebleeds, sore throat and tinnitus.   Eyes: Negative.  Negative for blurred vision, double vision, photophobia, pain, discharge and redness.  Respiratory: Negative.  Negative for cough, hemoptysis, sputum production, shortness of breath and wheezing.   Cardiovascular: Positive for leg swelling. Negative for chest pain, palpitations, orthopnea, claudication and PND.  Gastrointestinal: Positive for constipation and nausea. Negative for abdominal pain, blood in stool, diarrhea, melena and vomiting.  Genitourinary: Negative.   Musculoskeletal: Positive for joint pain. Negative for falls.       Hip  Skin: Negative.  Negative for itching and rash.  Neurological: Positive for weakness. Negative for dizziness, tingling, tremors, sensory change, speech change, focal weakness, seizures, loss of consciousness and headaches.  Endo/Heme/Allergies: Negative for environmental allergies and polydipsia. Bruises/bleeds easily.  Psychiatric/Behavioral: Negative.     Physical Exam  Constitutional: She is oriented to person, place, and time. She appears well-developed and well-nourished.  HENT:  Head: Normocephalic and atraumatic.  Eyes: Conjunctivae are normal. Pupils are equal, round, and reactive to light.  Neck: Normal range of motion.  Cardiovascular: Normal rate and regular rhythm.  Respiratory: Effort normal and breath sounds normal.  GI: Soft.  Musculoskeletal: She exhibits edema and tenderness.  Neurological: She is alert and oriented to person, place, and time.  Skin: Skin is warm and dry.  Psychiatric: She has a  normal mood and affect. Her behavior is normal. Judgment and thought content normal.    Encounter Medications:   Outpatient Encounter Medications as of 11/06/2017  Medication Sig  . aspirin EC 81 MG tablet Take 81 mg by mouth daily.  Marland Kitchen atorvastatin (LIPITOR) 40 MG tablet Take 40 mg by mouth daily.  . carvedilol (COREG) 12.5 MG tablet Take 1 tablet (12.5 mg total) by mouth 2 (two) times daily with a meal.  . diltiazem (CARDIZEM CD) 120 MG 24 hr capsule Take 120 mg by mouth daily.  . DiphenhydrAMINE HCl, Sleep, (SLEEP AID) 25 MG CAPS Take 1-2 capsules by mouth at bedtime as needed (for sleep).  . docusate sodium (COLACE) 100 MG capsule Take 100 mg by mouth daily.   . furosemide (LASIX) 20 MG tablet Take 20 mg by mouth daily.  Marland Kitchen HYDROcodone-acetaminophen (NORCO) 5-325 MG tablet Take 1 tablet by mouth every 6 (six) hours as needed for moderate pain or severe pain.  Marland Kitchen HYDROcodone-acetaminophen (NORCO) 5-325 MG tablet Take 1-2 tablets by mouth every 4 (four) hours as needed.  . isosorbide mononitrate (IMDUR) 30 MG 24 hr tablet Take 30 mg by mouth daily.   Marland Kitchen lisinopril (PRINIVIL,ZESTRIL) 20 MG tablet Take 20 mg by mouth daily.  . ondansetron (ZOFRAN) 4 MG tablet Take 1 tablet (4 mg total) by mouth every 8 (eight) hours as needed for nausea or vomiting.  . pantoprazole (PROTONIX) 40 MG tablet Take 40 mg by mouth daily.  . phenytoin (DILANTIN) 200 MG ER capsule Take 1 capsule (200 mg total) by mouth daily. (Patient not taking: Reported on 09/15/2017)  . ranitidine (ZANTAC) 150 MG tablet Take 150 mg by mouth 2 (two) times daily.  . traMADol (ULTRAM) 50 MG tablet Take 50 mg  by mouth 4 (four) times daily. 10 day course starting on 09/10/17  . zolpidem (AMBIEN) 10 MG tablet take 1 tablet by mouth at bedtime if needed  . [DISCONTINUED] promethazine (PHENERGAN) 12.5 MG tablet Take 1 tablet (12.5 mg total) by mouth every 6 (six) hours as needed for nausea or vomiting. (Patient not taking: Reported on 12/25/2015)    No facility-administered encounter medications on file as of 11/06/2017.     Functional Status:   In your present state of health, do you have any difficulty performing the following activities: 09/15/2017 08/24/2017  Hearing? N N  Vision? Y N  Difficulty concentrating or making decisions? N N  Walking or climbing stairs? Y Y  Dressing or bathing? Y N  Doing errands, shopping? Y N  Preparing Food and eating ? Y -  Using the Toilet? Y -  In the past six months, have you accidently leaked urine? N -  Do you have problems with loss of bowel control? N -  Managing your Medications? N -  Managing your Finances? N -  Housekeeping or managing your Housekeeping? Y -  Some recent data might be hidden    Fall/Depression Screening:    Fall Risk  09/15/2017 07/06/2017 03/09/2017  Falls in the past year? Yes Yes Yes  Number falls in past yr: 2 or more 2 or more 1  Injury with Fall? Yes Yes Yes  Risk Factor Category  High Fall Risk High Fall Risk -  Risk for fall due to : History of fall(s);Medication side effect;Impaired balance/gait;Impaired mobility;Impaired vision History of fall(s) Impaired balance/gait;Impaired mobility  Risk for fall due to: Comment - history of one fall in 2018 -  Follow up Falls evaluation completed;Education provided;Follow up appointment;Falls prevention discussed - Falls prevention discussed   PHQ 2/9 Scores 09/15/2017 07/06/2017 03/09/2017  PHQ - 2 Score 1 0 0    Assessment:  CM met with Mrs Jamar at her home. She was in her room in bed watching movies. Again today she was smoking upon CM arrival. Smoking counseling completed again but patient continues to not want assist with cessation She is reporting she is getting up out of bed independently but only when her son is at home. Her son was not present on CM arrival to the home but returned during the visit after going grocery shopping and to pick up prescriptions for Mrs Delcarlo. She did get up out of bed and into her w/c  before CM left the home. She voiced excitement of having lobster for dinner to surprise her grand daughters. She showed independence with w/c transfer and mobility although she continues to report that she is weak.     Foot drop/Swelling  Mrs Derrington did find her foot drop splint and is wearing it as discussed  There is noted swelling of her foot. She and her family report this is because she has been up more and is not elevating her feet as recommended. Cm encouraged elevation of feet, monitoring sodium and fluid intake, monitor the CHF action plan s/s to report to her MDs and smoke cessation  Inferior and superior Ramus Fracture/Therapy/Skin She confirmed she was visited by the [facility] PT today and has been informed she can be more active. Her family reports assisting her to get in and out of her tub for baths and monitoring her for skin breakdown.  No skin breakdown has been noted   Chronic constipation/nausea Mrs Pinkhasov continues to report hard stool that has to be removed  manually and nausea. Again CM recommend use of her Miralax, monitoring use of narcotics, increase of vegetable in her diet and following the plan of care recommended by her GI MD.  She confirms she has prn nausea & bowel elimination medicines at home but has not taken it. CM discussed noncompliance to treatment plan. She took nausea medications prior to CM departure of the home  HTN noted with an elevation in BP. CM discussed her smoking related to HTN. She reports awaiting son return with new prescriptions. Her family provided her with her pills and water to take then HTN medicine prior to CM leaving the home   Upcoming appointments  November 11 2017 to see [name] and her family confirm they can provide the transportation to [facility]  She still has not made a follow up appointment with her primary MD and prefers to consider this only after she is seen by [name] Reports she was last seen by the primary MD in December  2018   Plan:  CM to follow up with Mrs Guirguis after her visit to [name]  Route notes to MDs listed in [place] care team list  [facility] CM Care Plan Problem One     Most Recent Value  Care Plan Problem One  foot drop  Role Documenting the Problem One  Care Management Coordinator  Care Plan for Problem One  Active  [facility] Long Term Goal   Patient wear her foot drop splint during her bedrest of 6 weeks as evidence of observing use of foot drop splint during home visit and patient/family verbalizing use   [facility] Long Term Goal Start Date  10/09/17  [facility] Long Term Goal Met Date  11/06/17  Interventions for Problem One Long Term Goal  assess foot drop, discuss use of DME to assist and importance of wearing it, encouraged participation with use of foot drop splint from patient and family caregivers, re assess use of splint during home visits  [facility] CM Short Term Goal #1   over the next 21 days atient will find and place her foot splint on during her 6 week bedrest as evidence by observation of foot splint noted on patient, verbalization of use from patient and family  [facility] CM Short Term Goal #1 Start Date  10/09/17  [facility] CM Short Term Goal #1 Met Date  11/06/17  Interventions for Short Term Goal #1  assess foot drop, discuss use of DME to assist and importance of wearing it, encouraged participation with use of foot drop splint from patient and family caregivers, re assess use of splint during home visits    [facility] CM Care Plan Problem Two     Most Recent Value  Care Plan Problem Two  swelling of feet  Role Documenting the Problem Two  Care Management Telephonic Coordinator  Care Plan for Problem Two  Active  Interventions for Problem Two Long Term Goal   encouraged elevation of feet, monitoring sodium and fluid intake, monitor the CHF action plan s/s to report to her MDs and smoke cessation  [facility] Long Term Goal   Over the next 31 days, patient will have decrease swelling of feet as evidence by observation upon  examination during home visit  [place] Goal Start Date  11/06/17  [facility] CM Short Term Goal #1   Over the next 14 days, patient will monitor feet for swelling, follow plan of care and contact MD prn   [facility] CM Short Term Goal #1  Start Date  11/06/17  Interventions for Short Term Goal #2    Cm encouraged elevation of feet, monitoring sodium and fluid intake, monitor the CHF action plan s/s to report to her MDs and smoke cessation      Kimberly L. Lavina Hamman, RN, BSN, [place] Coordinator [phone] week day mobile

## 2017-11-09 DIAGNOSIS — Z7409 Other reduced mobility: Secondary | ICD-10-CM | POA: Diagnosis not present

## 2017-11-09 DIAGNOSIS — I69364 Other paralytic syndrome following cerebral infarction affecting left non-dominant side: Secondary | ICD-10-CM | POA: Diagnosis not present

## 2017-11-11 DIAGNOSIS — S32592D Other specified fracture of left pubis, subsequent encounter for fracture with routine healing: Secondary | ICD-10-CM | POA: Diagnosis not present

## 2017-11-12 IMAGING — CR DG FEMUR 2+V*L*
4 series · 4 of 4 positions shown · non-contrast
Comparison: 03/02/2017, 02/28/2017

CLINICAL DATA: Fell and hit leg

EXAM:
LEFT FEMUR 2 VIEWS

[w femur distal lat left (1 of 2)]
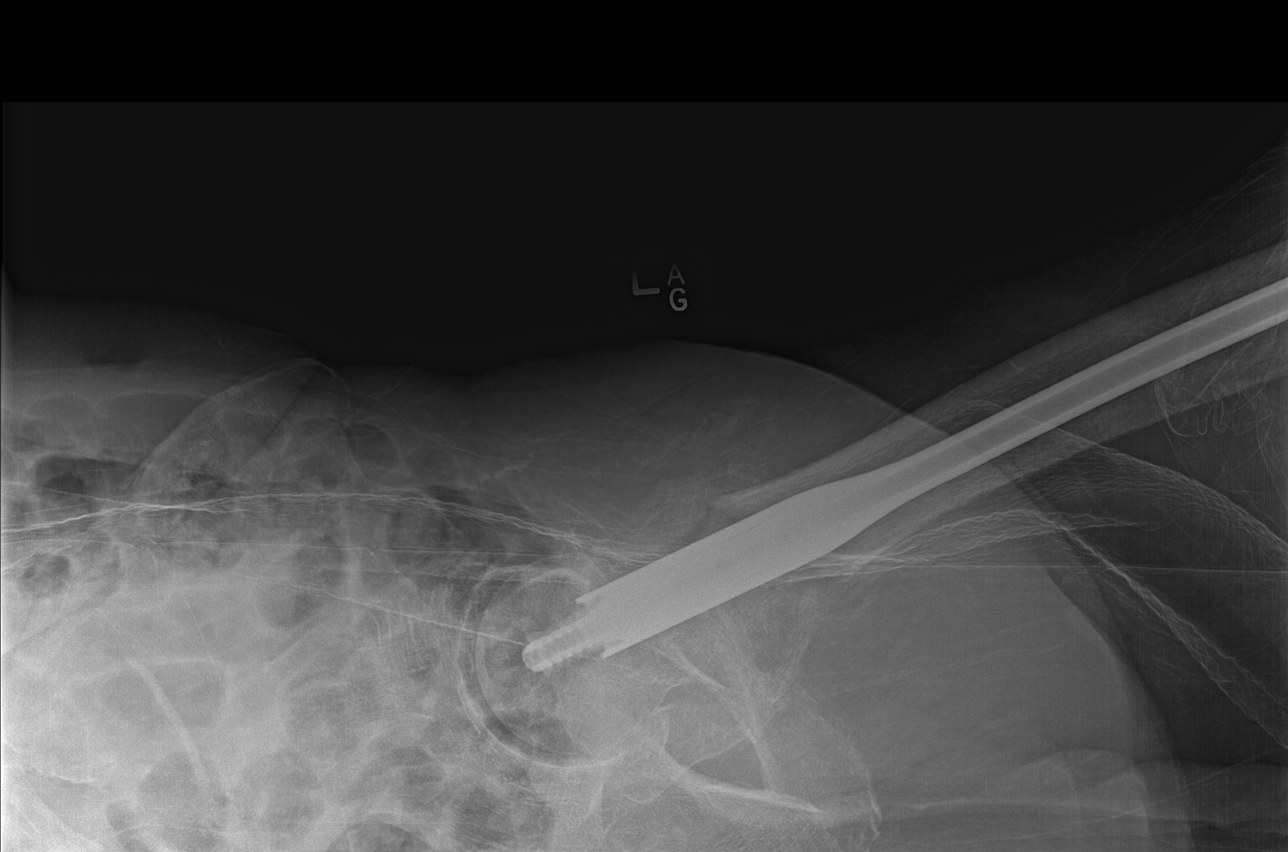

[w femur distal lat left (2 of 2)]
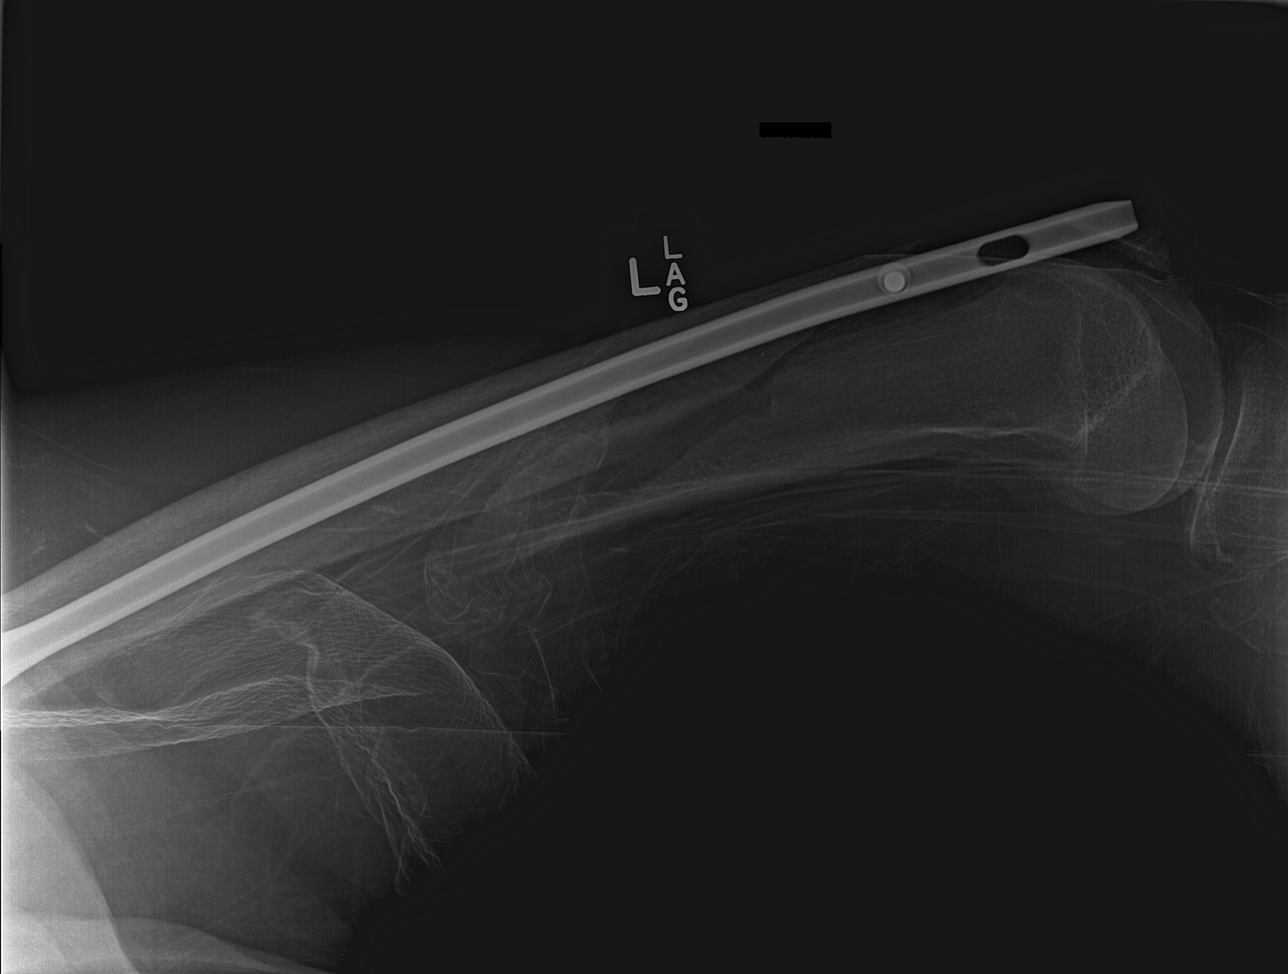

[x femur proximal ap left (1 of 2)]
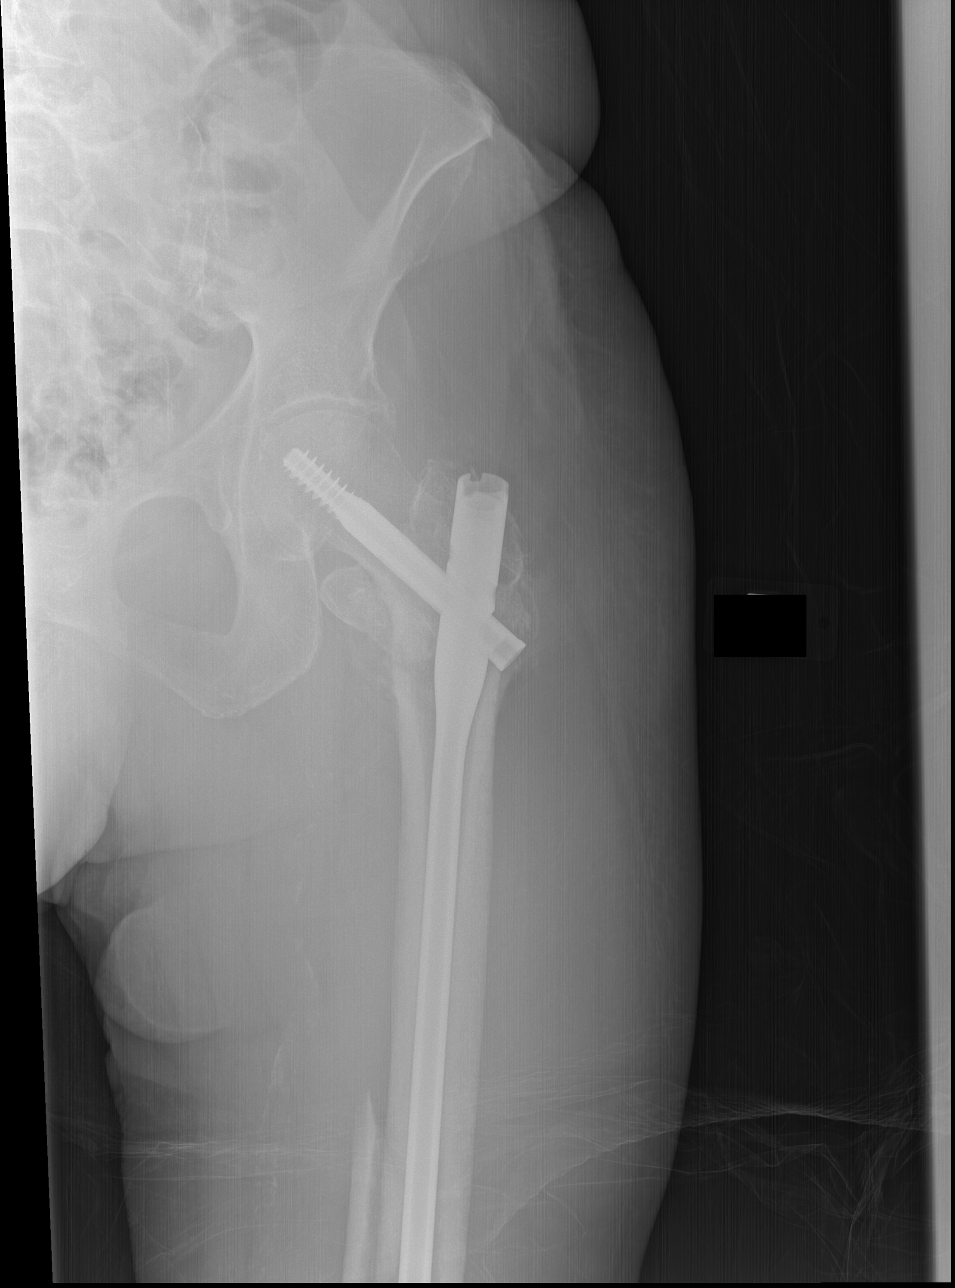

[x femur proximal ap left (2 of 2)]
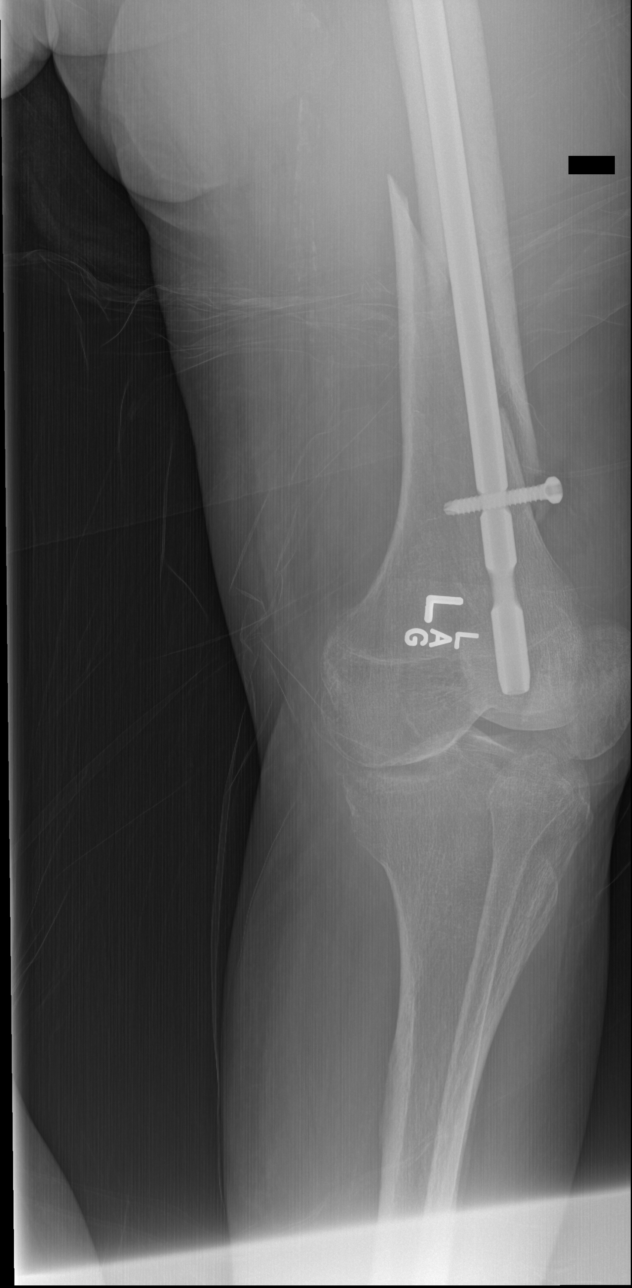

[4 of 4 positions shown; findings below may reference images not displayed]

FINDINGS: Status post internal fixation of remote left proximal femoral
fracture. There is an acute fracture involving the distal shaft of
the femur around the fixating rod, this demonstrates about [DATE] shaft
diameter of medial displacement of the distal fracture fragment and
about [DATE] shaft diameter of posterior displacement of distal
fracture fragment. The surgical rod is displaced anteriorly and
projects over the soft tissues of the distal anterior thigh.
Vascular calcifications. Some healing changes are noted about the
trochanteric fracture and the alignment here is grossly similar
compared to the prior radiographs.
IMPRESSION: 1. Status post left femoral rod and screw fixation of a prior left
trochanteric fracture. Now seen is an acute displaced fracture
involving the distal shaft of the femur. There is anterior
displacement of the distal portion of the femoral rod which projects
over the anterior soft tissues of the distal thigh/upper knee
2. Proximal femoral fracture grossly similar in alignment with some
bony healing changes noted at the fracture site.

## 2017-11-12 IMAGING — DX DG CHEST 1V PORT
1 series · 1 of 1 positions shown · non-contrast
Comparison: 04/21/2017.

CLINICAL DATA: Diabetes.  Preoperative chest x-ray.

EXAM:
PORTABLE CHEST 1 VIEW

[chest ap]
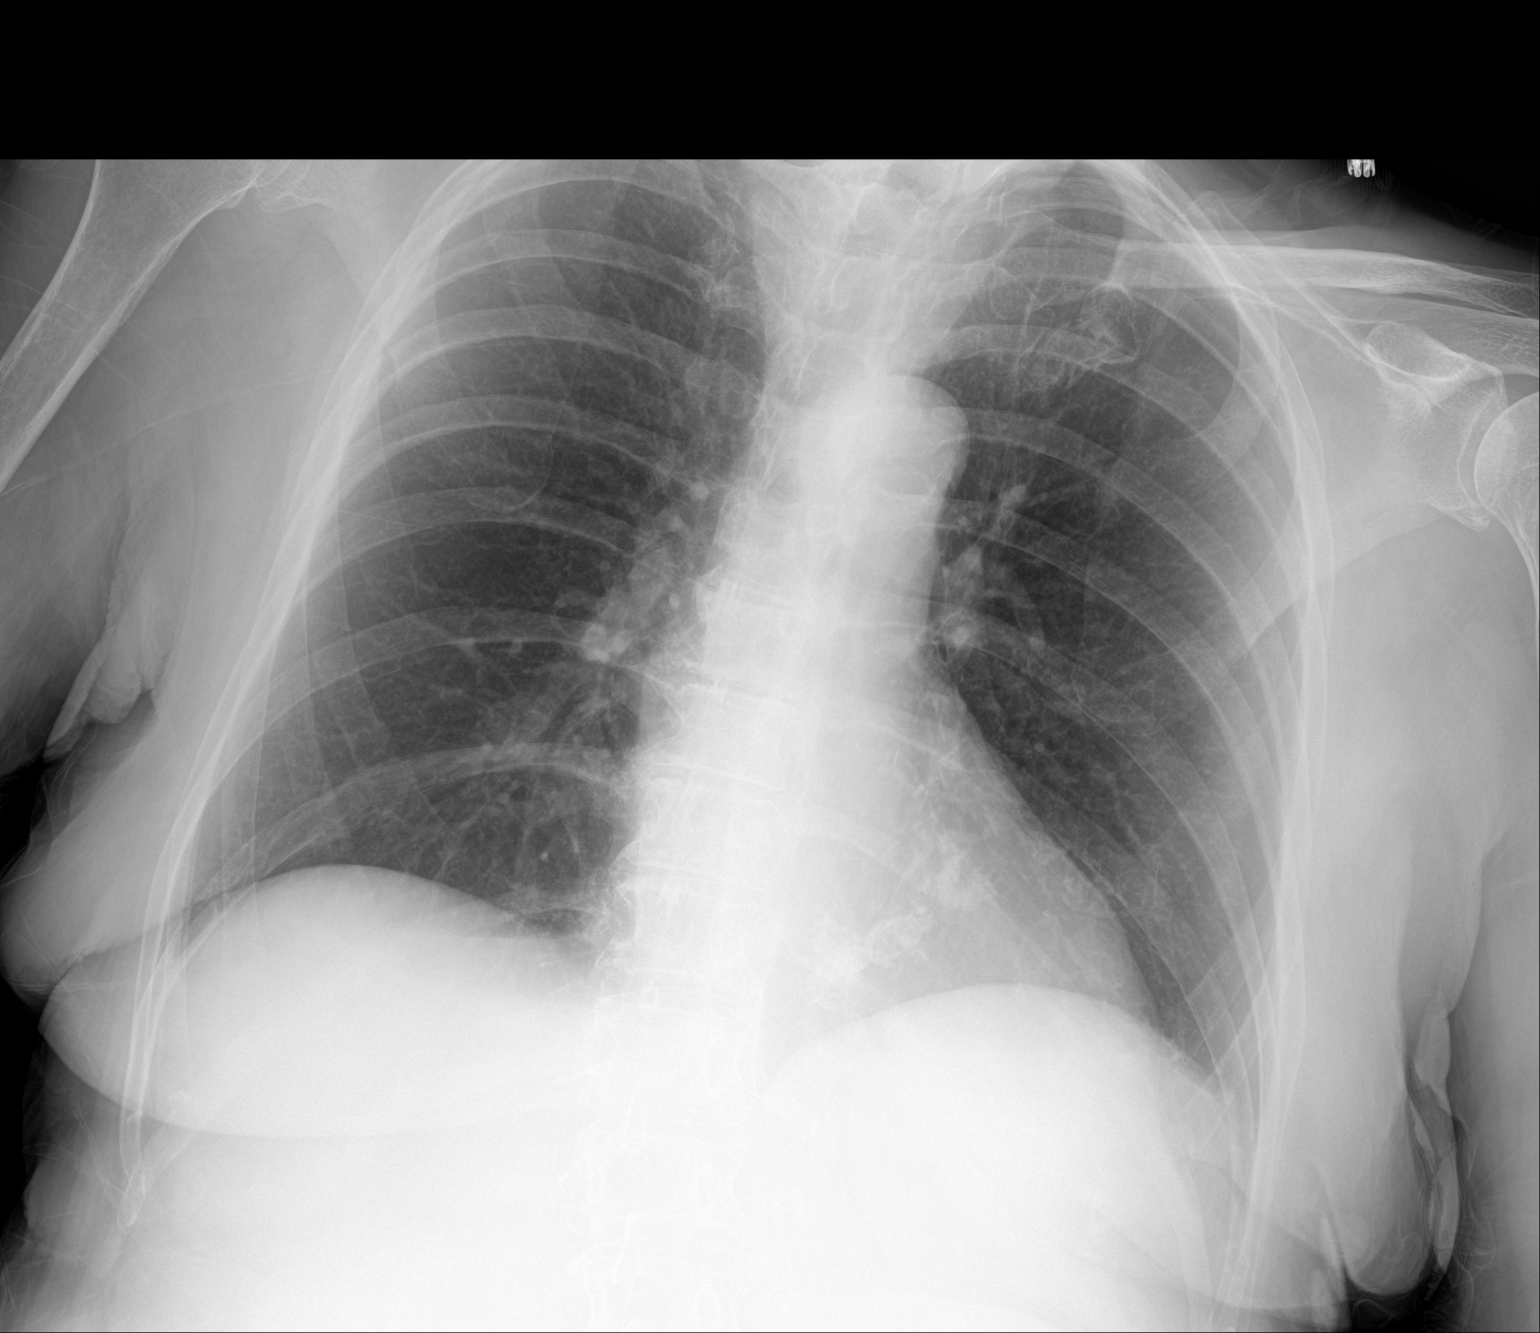

[1 of 1 positions shown; findings below may reference images not displayed]

FINDINGS: Mediastinum hilar structures normal. Heart size normal. No focal
infiltrate. Stable mild left base pleural thickening most consistent
with scar. Degenerative changes thoracic spine.
IMPRESSION: No acute cardiopulmonary disease.  Mild left base pleural scarring.

## 2017-11-12 IMAGING — DX DG CHEST 1V PORT
1 series · 1 of 1 positions shown · non-contrast
Comparison: Chest radiograph performed 02/28/2017

CLINICAL DATA: Preoperative chest radiograph for femur fracture.
Initial encounter.

EXAM:
PORTABLE CHEST 1 VIEW

[chest ap]
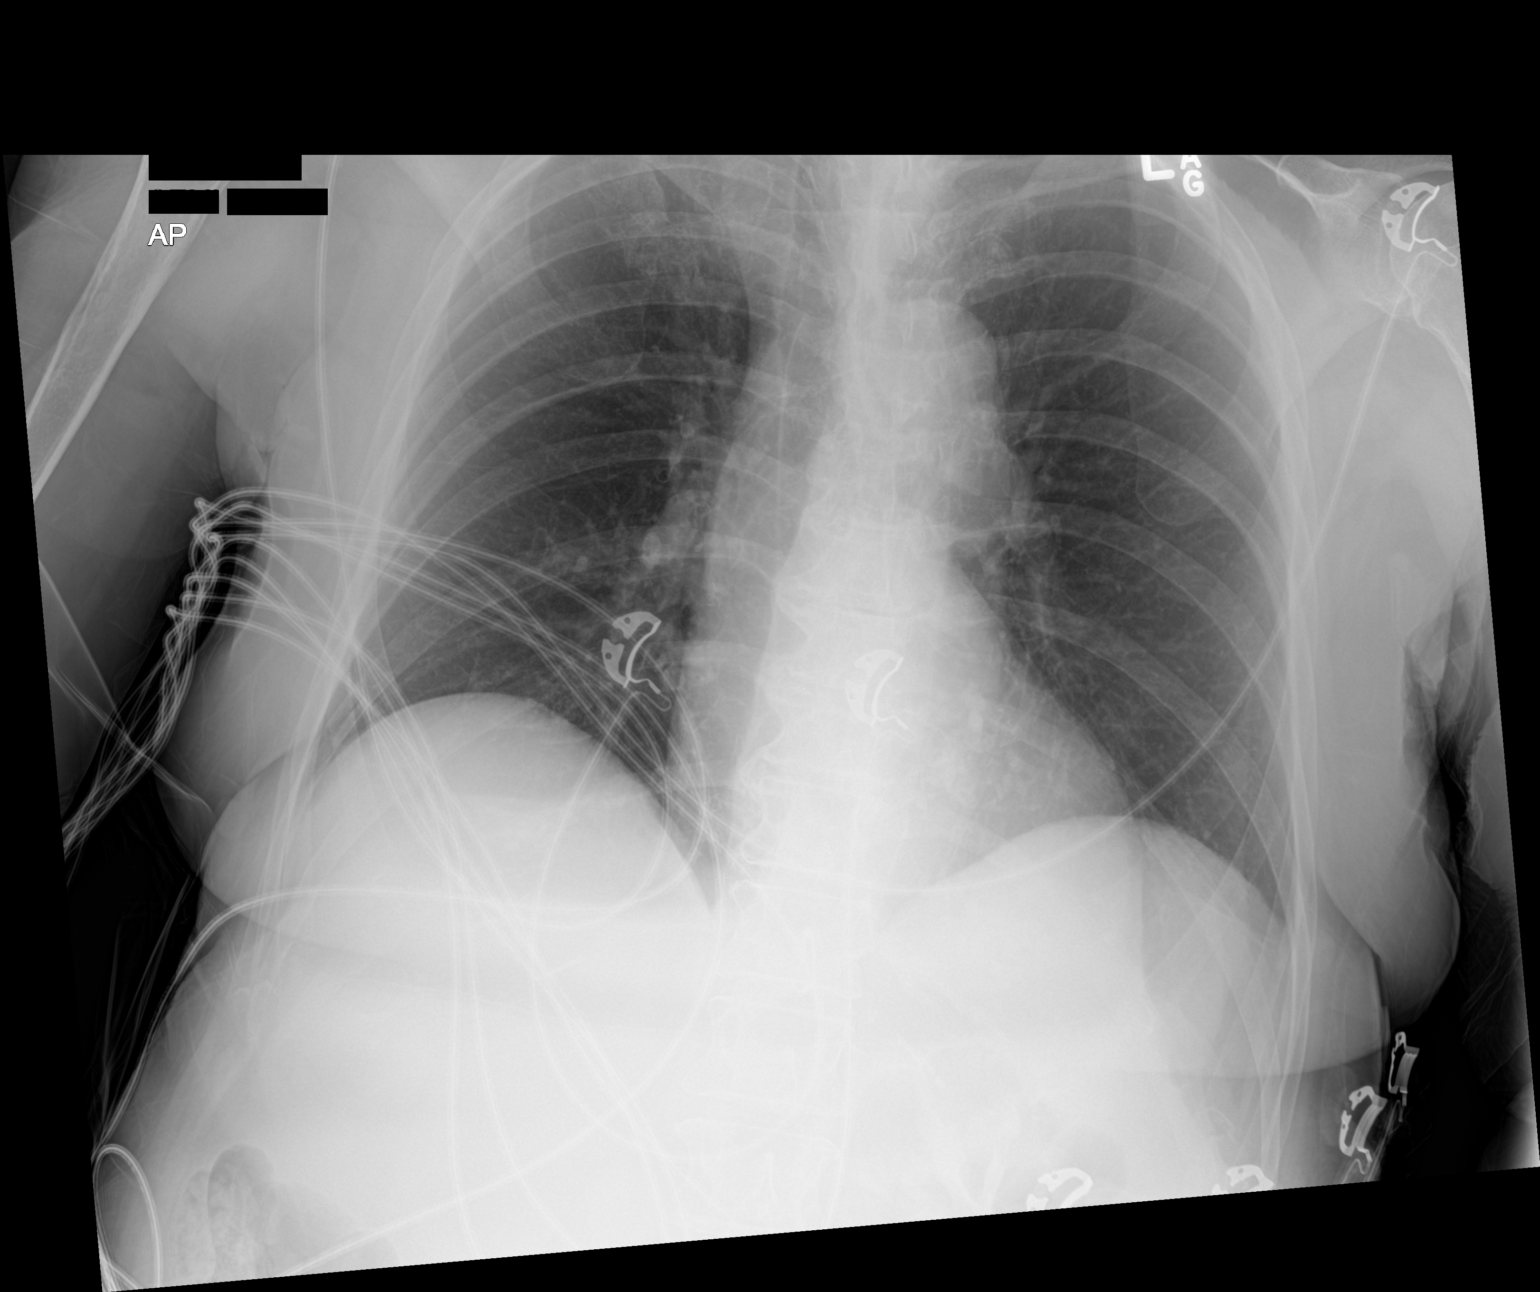

[1 of 1 positions shown; findings below may reference images not displayed]

FINDINGS: The lungs are well-aerated and clear. There is no evidence of focal
opacification, pleural effusion or pneumothorax.

The cardiomediastinal silhouette is within normal limits. No acute
osseous abnormalities are seen.
IMPRESSION: No acute cardiopulmonary process seen. No displaced rib fractures
identified.

## 2017-11-12 IMAGING — DX DG HIP (WITH OR WITHOUT PELVIS) 1V PORT*L*
2 series · 2 of 2 positions shown · non-contrast
Comparison: 04/21/2017, 03/02/2017

CLINICAL DATA: Preop, femur fracture

EXAM:
DG HIP (WITH OR WITHOUT PELVIS) 1V PORT LEFT

[pelvis ap]
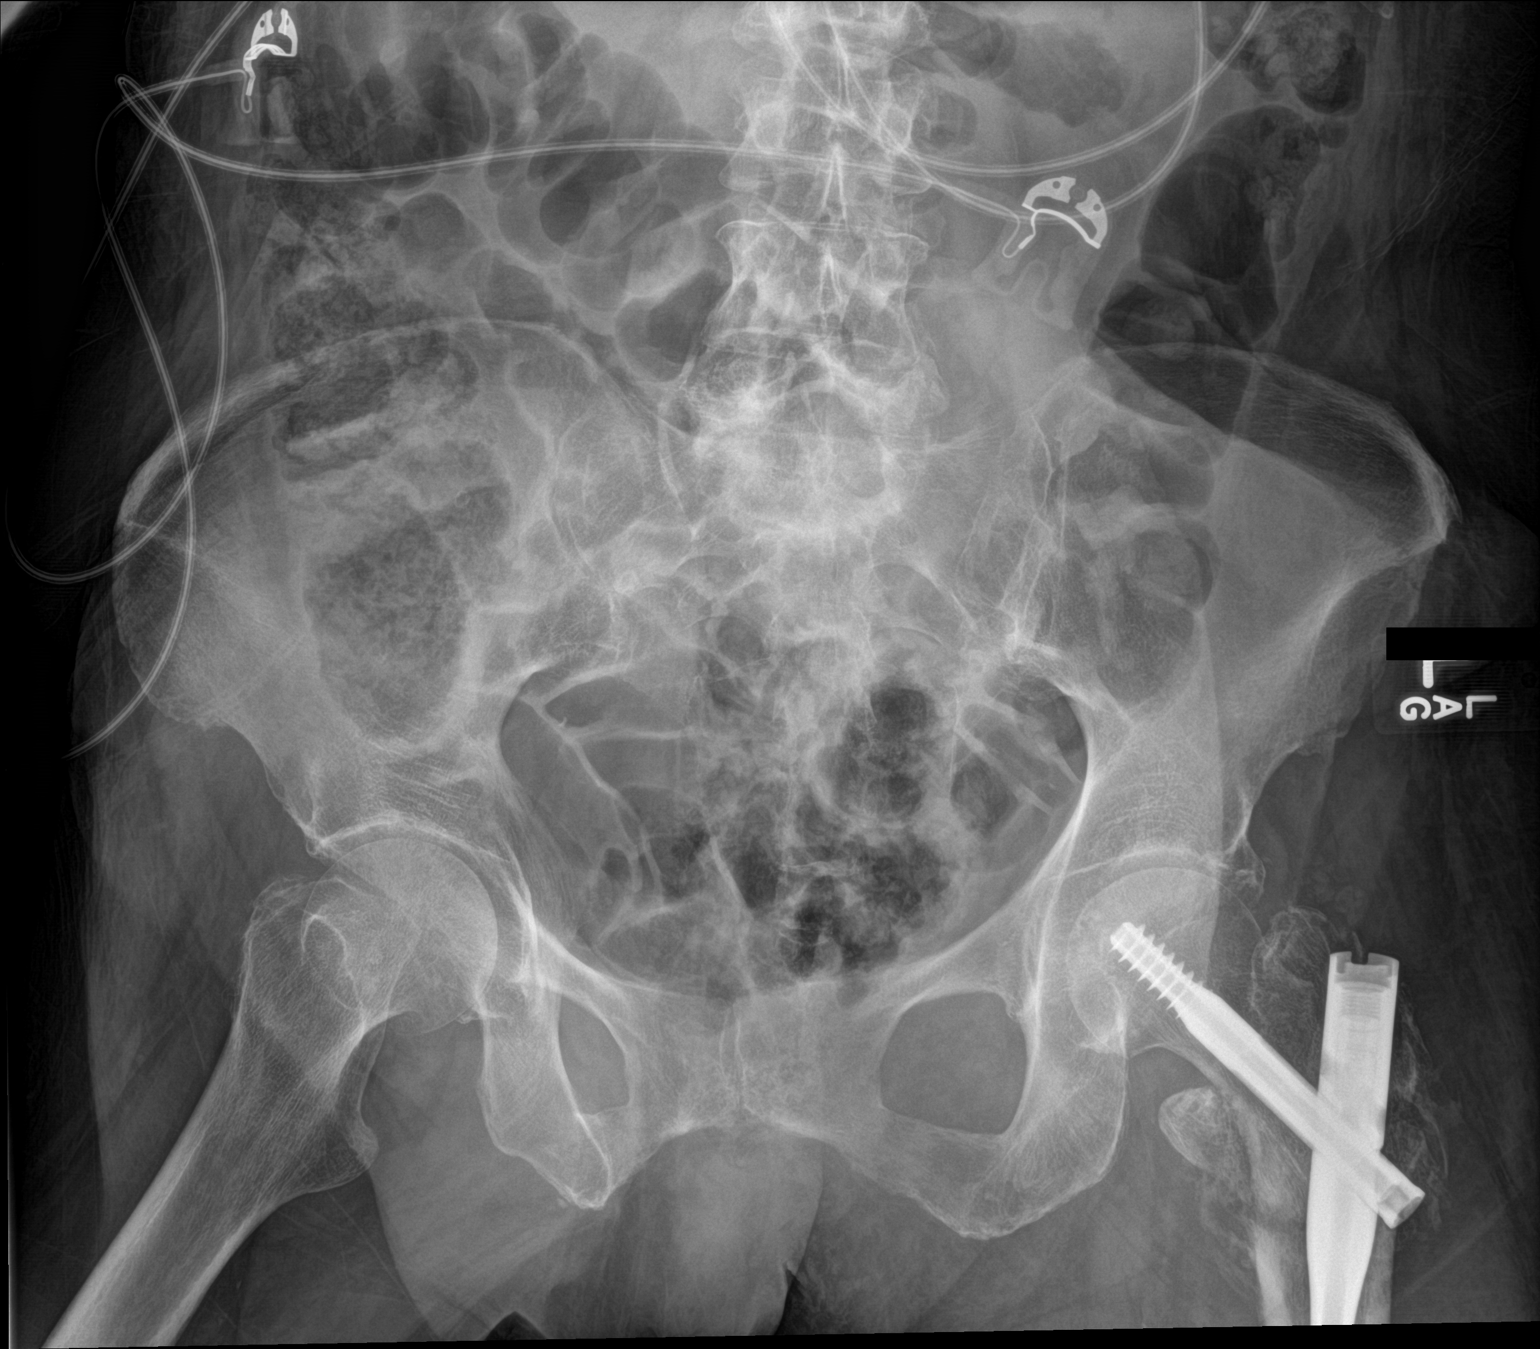

[hip ap]
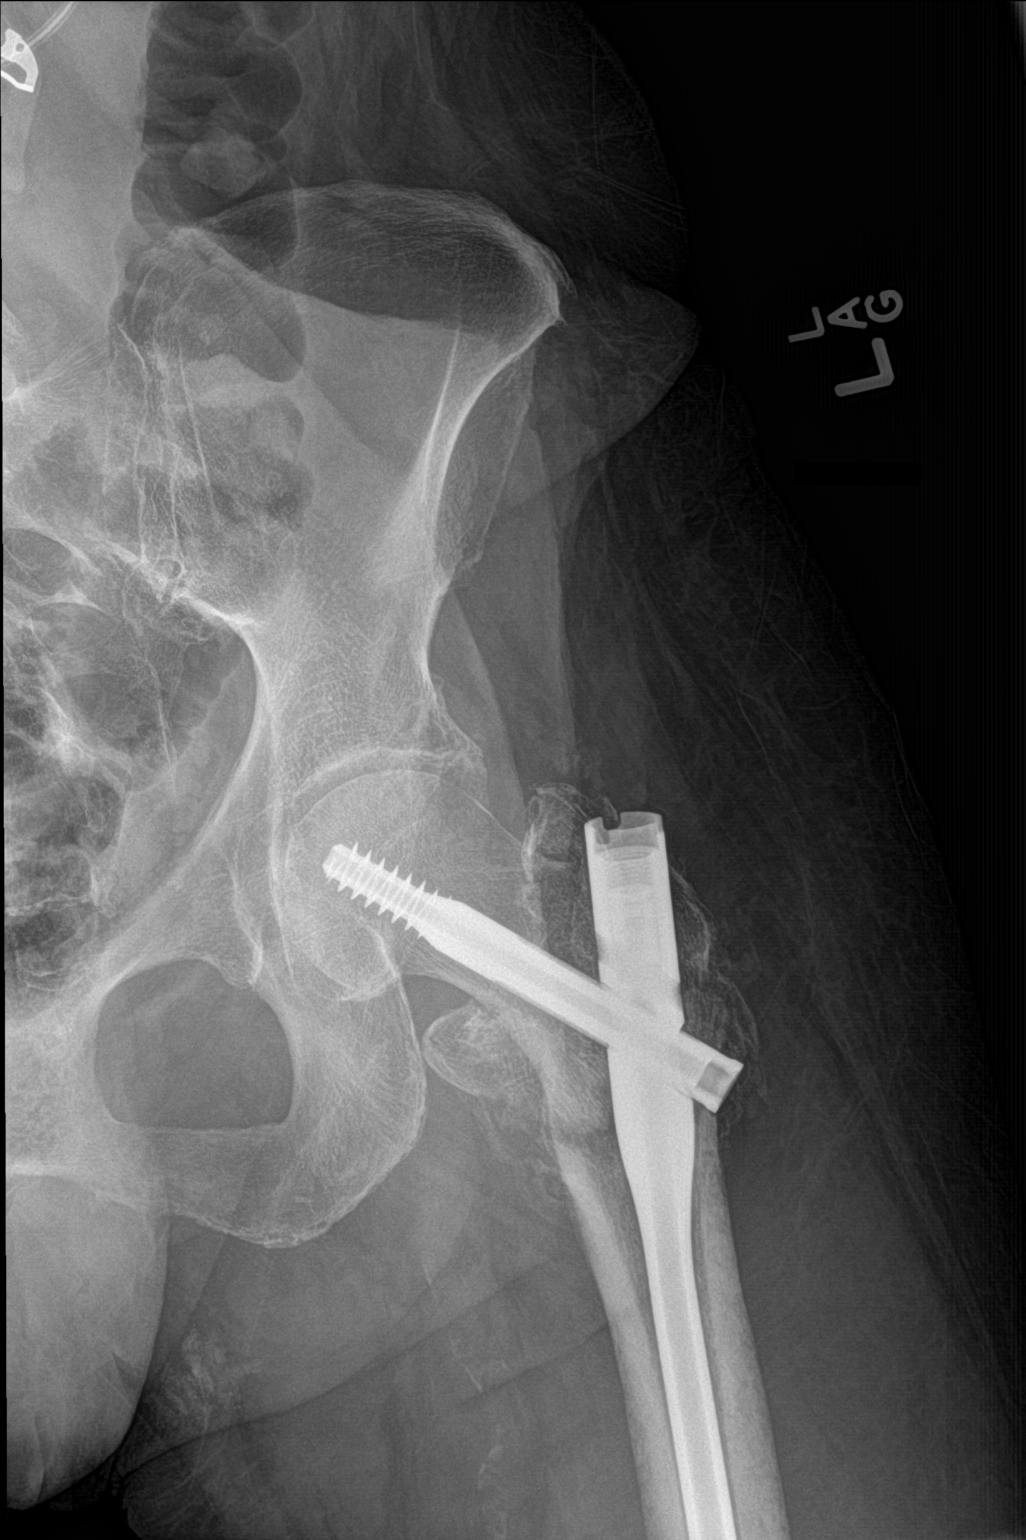

[2 of 2 positions shown; findings below may reference images not displayed]

FINDINGS: Pubic symphysis is intact. The pubic rami appear normal. Partially
visualized fixating rod in the proximal left femur. Comminuted
intertrochanteric fracture with some callus formation noted since
the prior radiograph. Vascular calcification.
IMPRESSION: Partially visualized left femoral fixating rod across comminuted
intertrochanteric fracture with some bony callus noted since the
prior study.

## 2017-11-14 IMAGING — CR DG FEMUR 2+V PORT*L*
4 series · 4 of 4 positions shown · non-contrast
Comparison: 04/21/2017 .

CLINICAL DATA: Open reduction internal fixation.

EXAM:
LEFT FEMUR PORTABLE 2 VIEWS

[AP (1 of 4)]
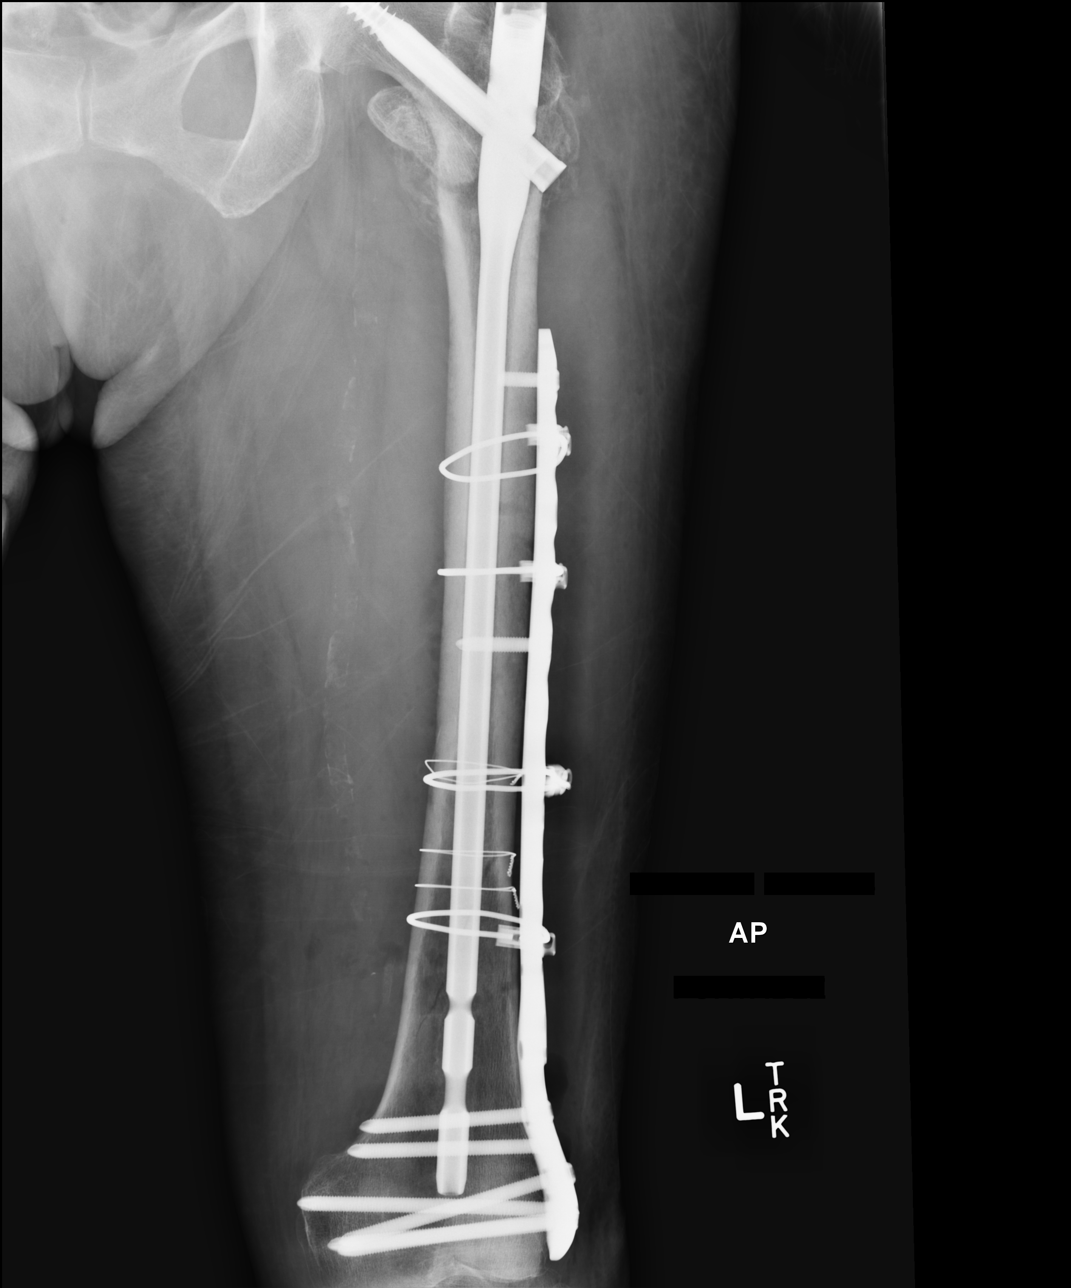

[AP (2 of 4)]
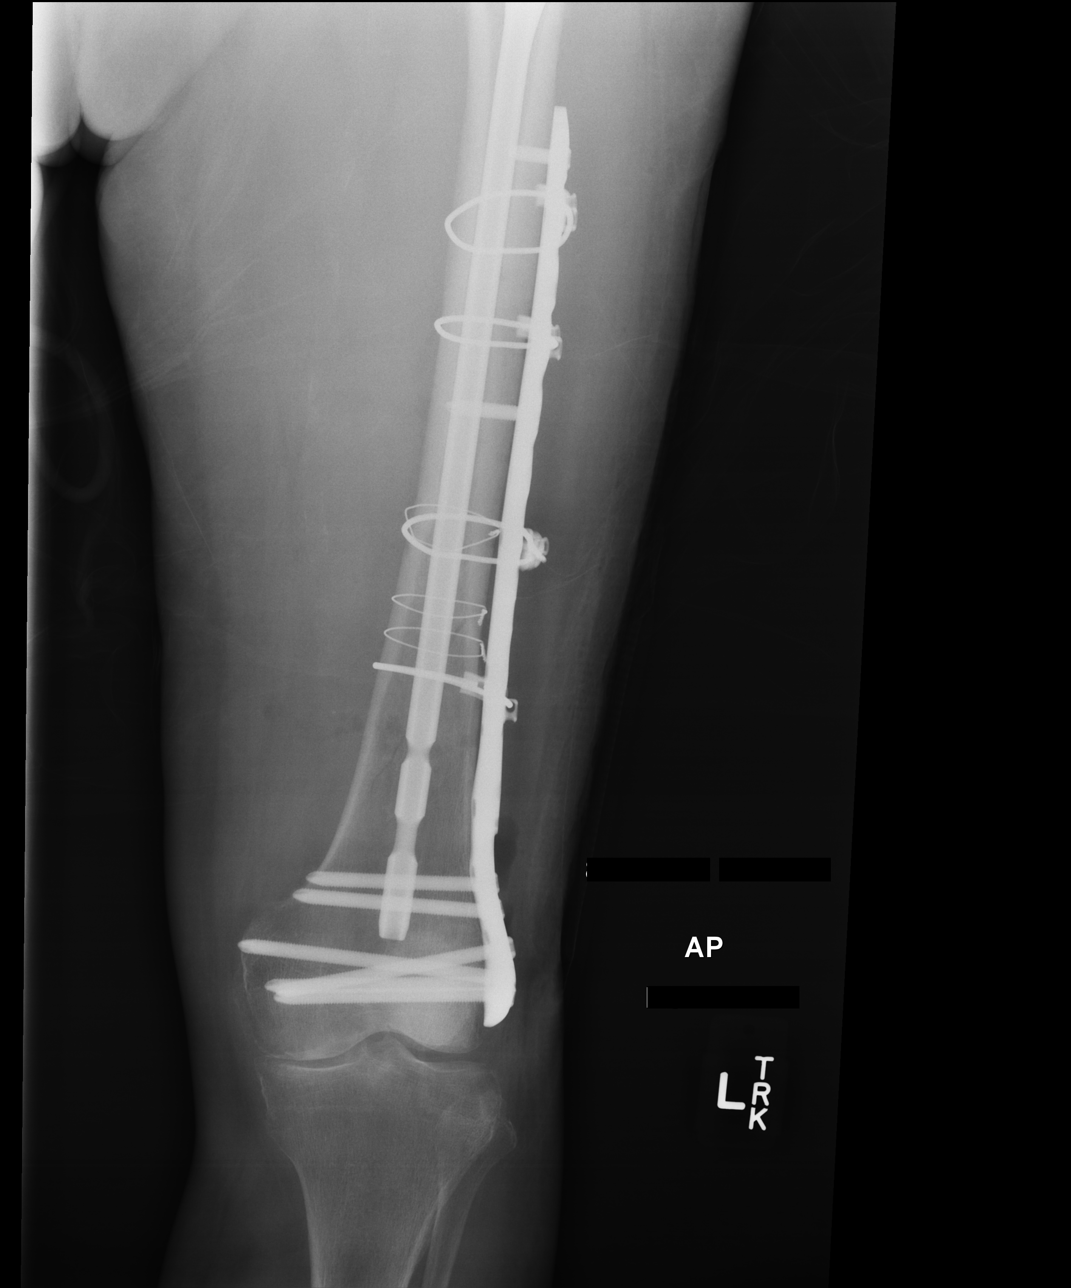

[AP (3 of 4)]
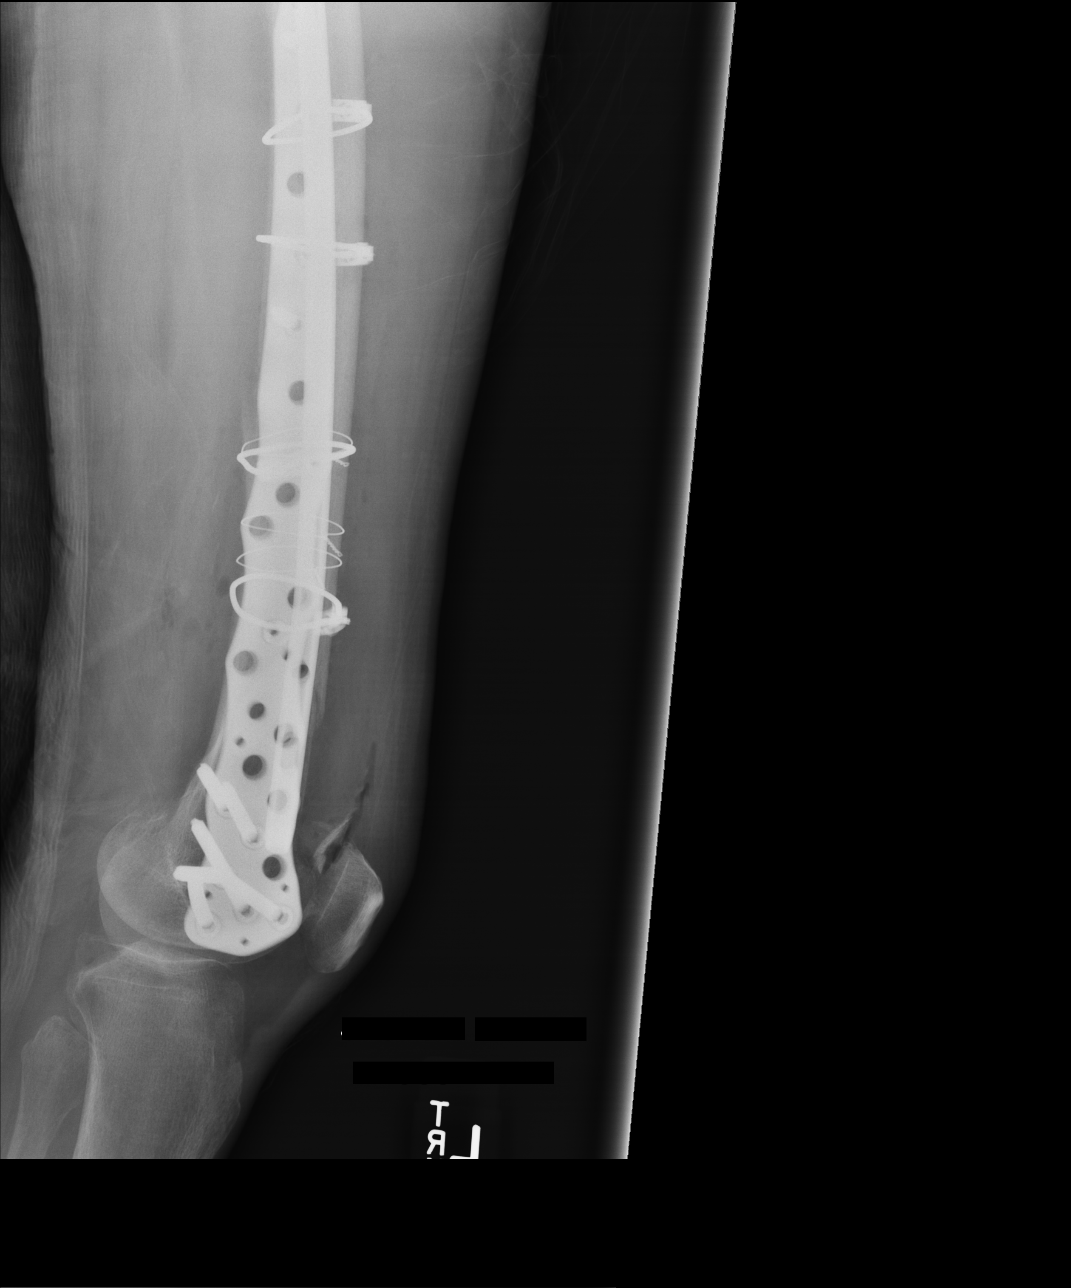

[AP (4 of 4)]
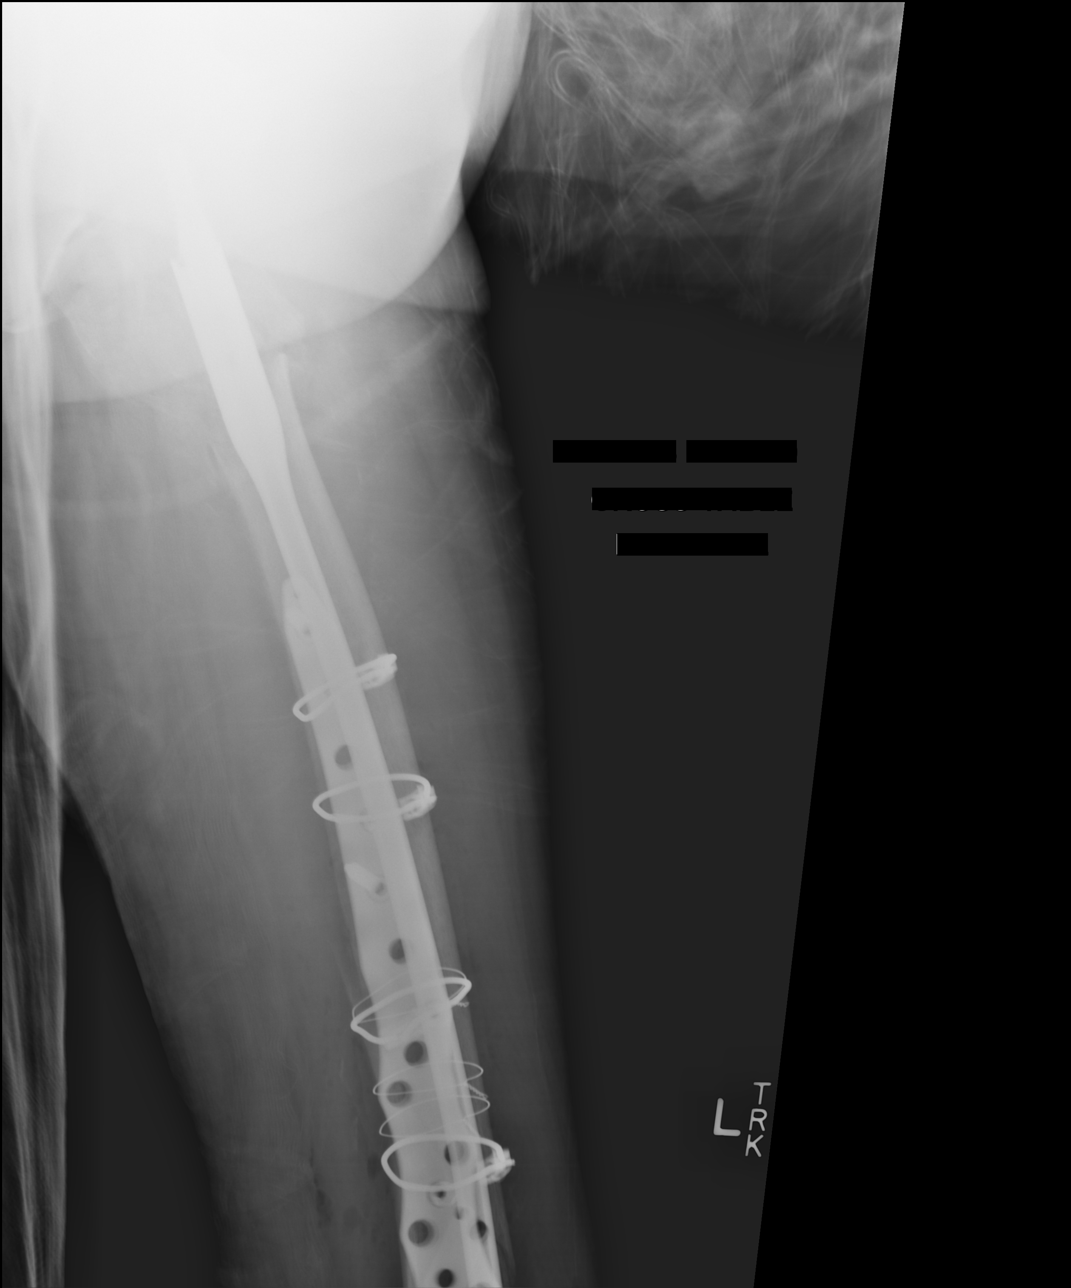

[4 of 4 positions shown; findings below may reference images not displayed]

FINDINGS: ORIF left femur. Hardware intact. Anatomic alignment. Bony fragments
noted over the suprapatellar space. Peripheral vascular
calcification .
IMPRESSION: 1. ORIF left femur. Anatomic alignment. Bony fragments noted over
the suprapatellar space.

2. Peripheral vascular disease.

## 2017-11-16 DIAGNOSIS — Z961 Presence of intraocular lens: Secondary | ICD-10-CM | POA: Diagnosis not present

## 2017-11-16 DIAGNOSIS — H5213 Myopia, bilateral: Secondary | ICD-10-CM | POA: Diagnosis not present

## 2017-11-16 DIAGNOSIS — H35372 Puckering of macula, left eye: Secondary | ICD-10-CM | POA: Diagnosis not present

## 2017-11-16 DIAGNOSIS — H524 Presbyopia: Secondary | ICD-10-CM | POA: Diagnosis not present

## 2017-11-16 DIAGNOSIS — H52203 Unspecified astigmatism, bilateral: Secondary | ICD-10-CM | POA: Diagnosis not present

## 2017-11-20 ENCOUNTER — Other Ambulatory Visit: Payer: Self-pay | Admitting: *Deleted

## 2017-11-20 ENCOUNTER — Other Ambulatory Visit: Payer: Self-pay

## 2017-11-20 NOTE — Patient Outreach (Signed)
[place] [facility]) Care Management   11/20/2017  Paulene Tayag [birth year] [id number]   Subjective:  See notes below  Objective:  BP (!) 160/70   Pulse 78   Temp (!) 97.3 F (36.3 C) (Oral)   Resp 20   SpO2 94%    Review of Systems  Constitutional: Negative for chills, diaphoresis, fever, malaise/fatigue and weight loss.  HENT: Positive for sore throat. Negative for congestion, ear discharge, ear pain, hearing loss, nosebleeds, sinus pain and tinnitus.   Eyes: Positive for blurred vision. Negative for double vision, photophobia, pain, discharge and redness.       Blurred left eye related to stroke Dr Gershon Crane managing "he said there is nothing I can do about it"  Respiratory: Negative.  Negative for cough, hemoptysis, sputum production, shortness of breath, wheezing and stridor.   Cardiovascular: Positive for leg swelling. Negative for chest pain, palpitations, orthopnea, claudication and PND.  Gastrointestinal: Positive for diarrhea. Negative for abdominal pain, blood in stool, constipation, heartburn, melena, nausea and vomiting.  Genitourinary: Negative for dysuria, flank pain, frequency, hematuria and urgency.  Musculoskeletal: Negative.  Negative for back pain, falls, joint pain, myalgias and neck pain.  Skin: Negative.  Negative for itching and rash.  Neurological: Positive for speech change, focal weakness and weakness. Negative for dizziness, tingling, tremors, sensory change, seizures, loss of consciousness and headaches.  Endo/Heme/Allergies: Negative.  Negative for environmental allergies and polydipsia. Does not bruise/bleed easily.  Psychiatric/Behavioral: Negative.  Negative for depression, hallucinations, memory loss, substance abuse and suicidal ideas. The patient is not nervous/anxious and does not have insomnia.     Physical Exam  Constitutional: She is oriented to person, place, and time. She appears well-developed and well-nourished.  HENT:  Head:  Normocephalic and atraumatic.  Eyes: Conjunctivae are normal. Pupils are equal, round, and reactive to light.  Neck: Normal range of motion. Neck supple.  Cardiovascular: Normal rate, regular rhythm, normal heart sounds and intact distal pulses.  Respiratory: Effort normal and breath sounds normal.  GI: Soft. Bowel sounds are normal.  Neurological: She is alert and oriented to person, place, and time.  Skin: Skin is warm and dry.  Psychiatric: She has a normal mood and affect. Her behavior is normal. Judgment and thought content normal.    Encounter Medications:   Outpatient Encounter Medications as of 11/20/2017  Medication Sig  . aspirin EC 81 MG tablet Take 81 mg by mouth daily.  Marland Kitchen atorvastatin (LIPITOR) 40 MG tablet Take 40 mg by mouth daily.  . carvedilol (COREG) 12.5 MG tablet Take 1 tablet (12.5 mg total) by mouth 2 (two) times daily with a meal.  . diltiazem (CARDIZEM CD) 120 MG 24 hr capsule Take 120 mg by mouth daily.  . DiphenhydrAMINE HCl, Sleep, (SLEEP AID) 25 MG CAPS Take 1-2 capsules by mouth at bedtime as needed (for sleep).  . docusate sodium (COLACE) 100 MG capsule Take 100 mg by mouth daily.   . furosemide (LASIX) 20 MG tablet Take 20 mg by mouth daily.  Marland Kitchen HYDROcodone-acetaminophen (NORCO) 5-325 MG tablet Take 1 tablet by mouth every 6 (six) hours as needed for moderate pain or severe pain.  Marland Kitchen HYDROcodone-acetaminophen (NORCO) 5-325 MG tablet Take 1-2 tablets by mouth every 4 (four) hours as needed.  . isosorbide mononitrate (IMDUR) 30 MG 24 hr tablet Take 30 mg by mouth daily.   Marland Kitchen lisinopril (PRINIVIL,ZESTRIL) 20 MG tablet Take 20 mg by mouth daily.  . ondansetron (ZOFRAN) 4 MG tablet Take 1 tablet (4  mg total) by mouth every 8 (eight) hours as needed for nausea or vomiting.  . pantoprazole (PROTONIX) 40 MG tablet Take 40 mg by mouth daily.  . phenytoin (DILANTIN) 200 MG ER capsule Take 1 capsule (200 mg total) by mouth daily. (Patient not taking: Reported on 09/15/2017)   . ranitidine (ZANTAC) 150 MG tablet Take 150 mg by mouth 2 (two) times daily.  . traMADol (ULTRAM) 50 MG tablet Take 50 mg by mouth 4 (four) times daily. 10 day course starting on 09/10/17  . zolpidem (AMBIEN) 10 MG tablet take 1 tablet by mouth at bedtime if needed  . [DISCONTINUED] promethazine (PHENERGAN) 12.5 MG tablet Take 1 tablet (12.5 mg total) by mouth every 6 (six) hours as needed for nausea or vomiting. (Patient not taking: Reported on 12/25/2015)   No facility-administered encounter medications on file as of 11/20/2017.     Functional Status:   In your present state of health, do you have any difficulty performing the following activities: 09/15/2017 08/24/2017  Hearing? N N  Vision? Y N  Difficulty concentrating or making decisions? N N  Walking or climbing stairs? Y Y  Dressing or bathing? Y N  Doing errands, shopping? Y N  Preparing Food and eating ? Y -  Using the Toilet? Y -  In the past six months, have you accidently leaked urine? N -  Do you have problems with loss of bowel control? N -  Managing your Medications? N -  Managing your Finances? N -  Housekeeping or managing your Housekeeping? Y -  Some recent data might be hidden    Fall/Depression Screening:    Fall Risk  09/15/2017 07/06/2017 03/09/2017  Falls in the past year? Yes Yes Yes  Number falls in past yr: 2 or more 2 or more 1  Injury with Fall? Yes Yes Yes  Risk Factor Category  High Fall Risk High Fall Risk -  Risk for fall due to : History of fall(s);Medication side effect;Impaired balance/gait;Impaired mobility;Impaired vision History of fall(s) Impaired balance/gait;Impaired mobility  Risk for fall due to: Comment - history of one fall in 2018 -  Follow up Falls evaluation completed;Education provided;Follow up appointment;Falls prevention discussed - Falls prevention discussed   PHQ 2/9 Scores 09/15/2017 07/06/2017 03/09/2017  PHQ - 2 Score 1 0 0    Assessment:    Mrs Emig reports an episode "last  Thursday" that she reports involved edema and slurred speech. She reports the issues resolved but left a residual "bad taste in my mouth" She reports calling her Cardiologist and being told to go to the ED but states she did not go.  Today CM is not noting slurred speech but edema of lower extremities   CHF with the concerns voiced and noted edema of extremities CM strongly encouraged Mrs Wilms to go to see her medical provider She states she has not made any attempt "cause I just have not felt up to it. " CM discussed the importance of follow up care to prevent  Major concerns. Cm spoke with her family present to encourage her to be seen by a medial provider. Her BP was elevated today and she was encouraged to take her BP medications that she reports "I have not taken them yet"  She was encouraged to continue to monitor her BP but reports her grand daughter has her BP cuff  She is complying with keeping her legs elevated in bed   She confirms she has been released by [name]  from bedrest   DM continues to be managed with medicine and diet Bad taste in mouth  Saw neurologist a few weeks ago   Plan:  CM discuss a transfer to [facility] disease CM program and Mrs Purohit agreed to be transferred for further disease education  Discussed importance of M follow on symptoms reported  Discussed TIAs, stroke  CM left a voice message at the primary MD office and CM received a call from the primary care MD office and spoke with [facility] who sent a note to MD on call for Dr Guadalupe Maple confirms she is unable to see notes from [name] in [place] requested a resumption of hh PT per pt request. Cm also discussed with Mrs Goedecke that she may need to see MD face to face prior to orders being resumed. She voiced understanding Letter to MD Route note    [facility] CM Care Plan Problem One     Most Recent Value  Care Plan Problem One  foot drop  Role Documenting the Problem One  Care Management Coordinator   Care Plan for Problem One  Active  THN Long Term Goal   Patient wear her foot drop splint during her bedrest of 6 weeks as evidence of observing use of foot drop splint during home visit and patient/family verbalizing use   THN Long Term Goal Start Date  10/09/17  THN Long Term Goal Met Date  11/06/17  THN CM Short Term Goal #1   over the next 21 days atient will find and place her foot splint on during her 6 week bedrest as evidence by observation of foot splint noted on patient, verbalization of use from patient and family  THN CM Short Term Goal #1 Start Date  10/09/17  [facility] CM Short Term Goal #1 Met Date  11/06/17    [facility] CM Care Plan Problem Two     Most Recent Value  Care Plan Problem Two  swelling of feet  Role Documenting the Problem Two  Care Management Telephonic Coordinator  Care Plan for Problem Two  Active  Interventions for Problem Two Long Term Goal   continue to encourage elevation of legs, encouraged MD follow up care, re reviwed chf zones and s/d to report to MDs  [facility] Long Term Goal   Over the next 31 days, patient will have decrease swelling of feet as evidence by observation upon examination during home visit  [place] Start Date  11/06/17  [facility] CM Short Term Goal #1   Over the next 14 days, patient will monitor feet for swelling, follow plan of care and contact MD prn   THN CM Short Term Goal #1 Start Date  11/06/17  Interventions for Short Term Goal #2   called MD office left a voice message, encouraged to take medicines as ordered, encoaurged family to encouraged MD folllow up care.encouragd use of 24 hr RN line, review of TIA/stroke s/d       Golda Zavalza L. Lavina Hamman, RN, BSN, [place] Coordinator [phone] week day mobile

## 2017-11-30 IMAGING — US US ABDOMEN LIMITED
1 series · 14 of 25 positions shown · non-contrast
Comparison: 12/19/2015 .  CT 07/09/2015.

CLINICAL DATA: Liver cirrhosis.

EXAM:
US ABDOMEN LIMITED - RIGHT UPPER QUADRANT

[Series 1: us abdomen limited · 0.24mm/px · 14 of 47 slices shown]
[im 1/47]
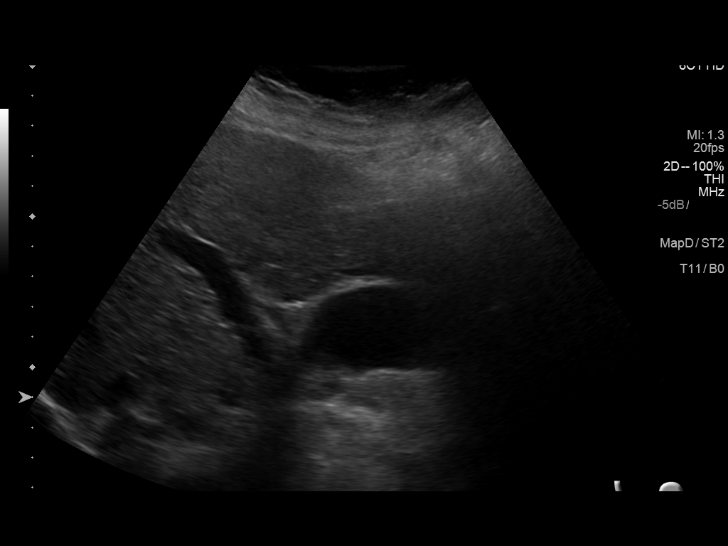
[im 4/47]
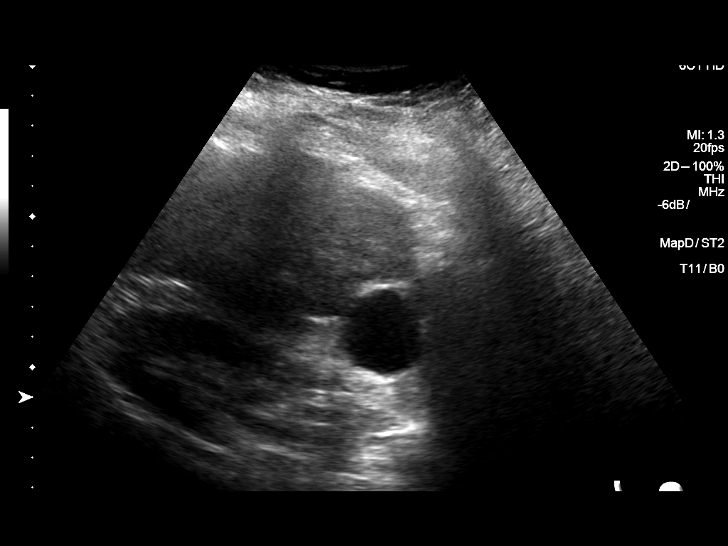
[im 8/47]
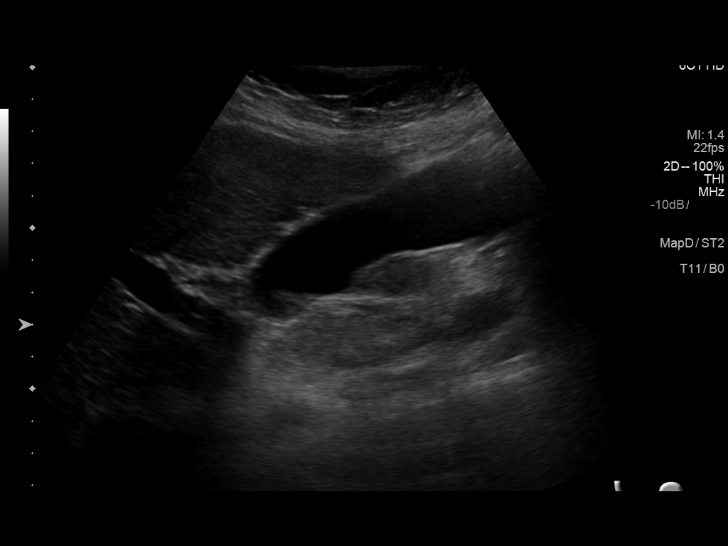
[im 12/47]
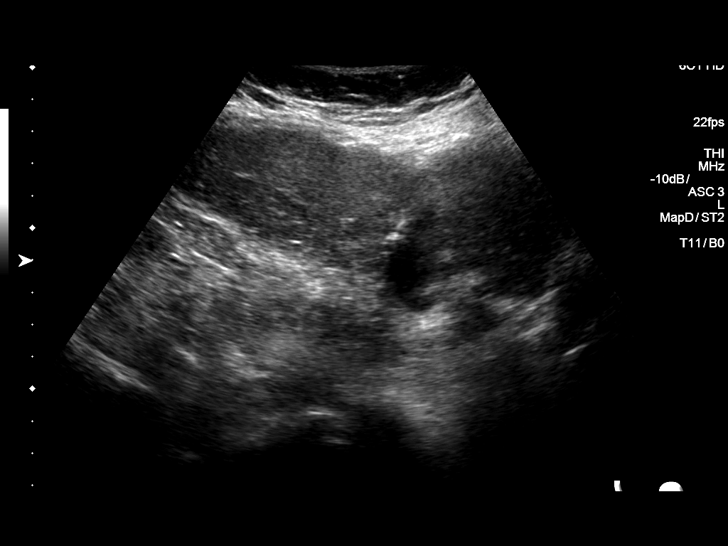
[im 16/47]
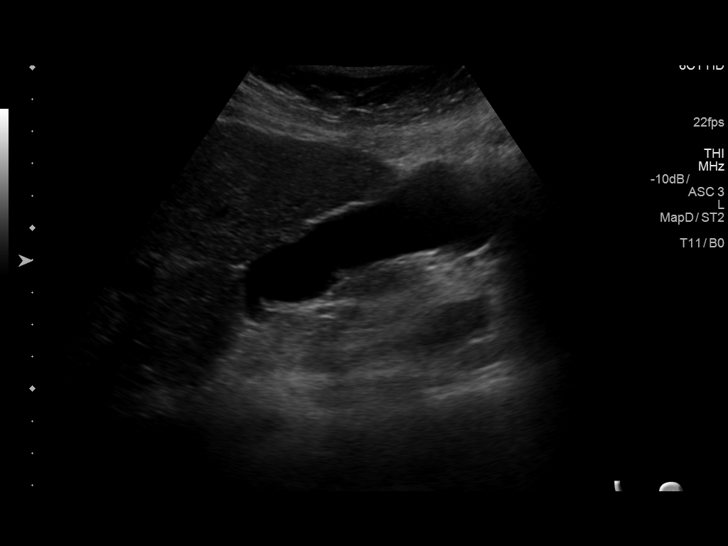
[im 18/47]
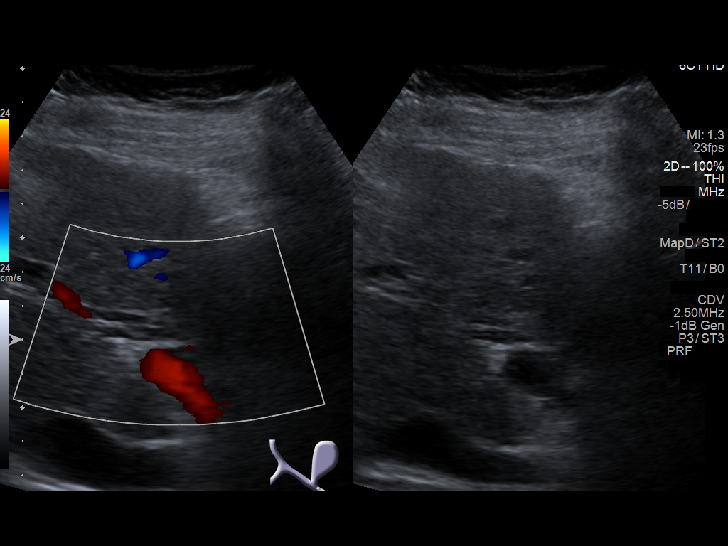
[im 22/47]
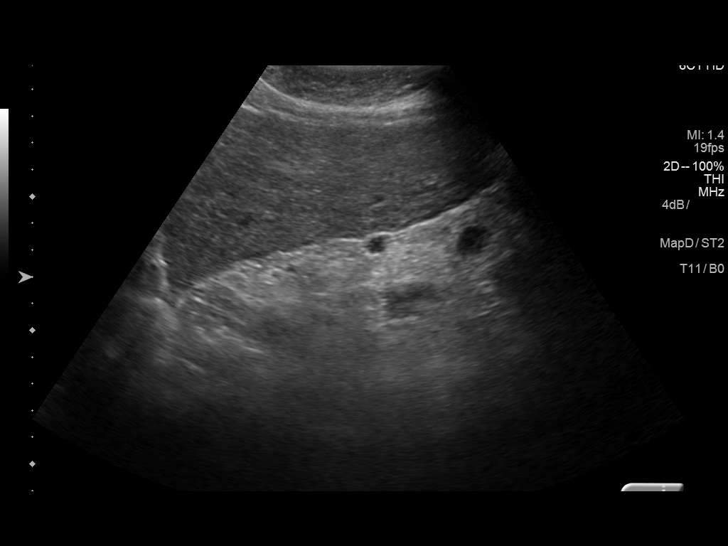
[im 25/47]
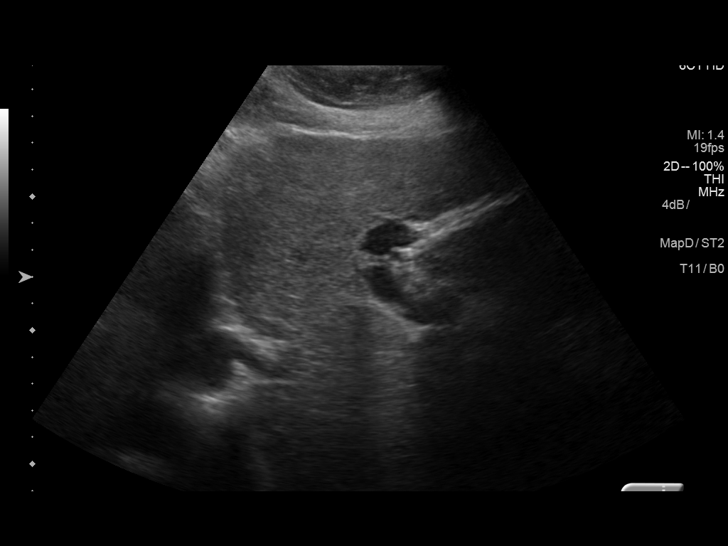
[im 29/47]
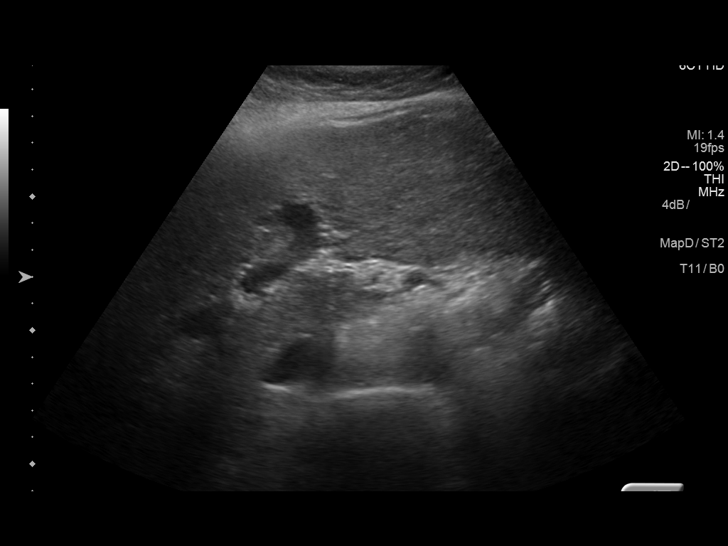
[im 31/47]
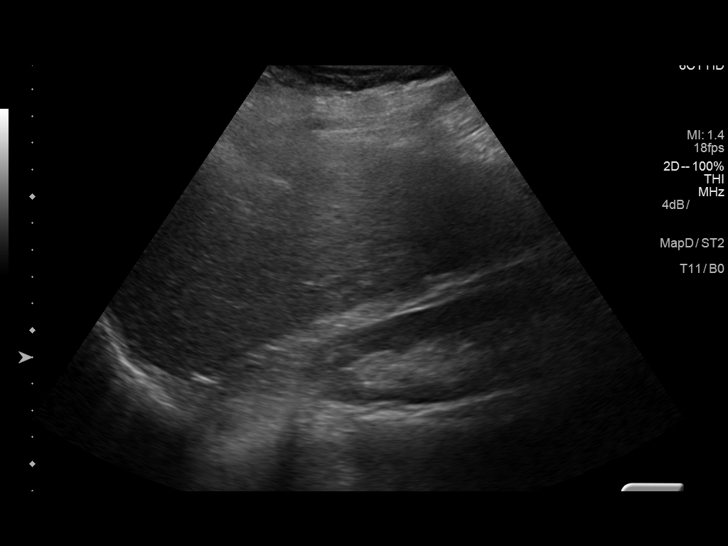
[im 35/47]
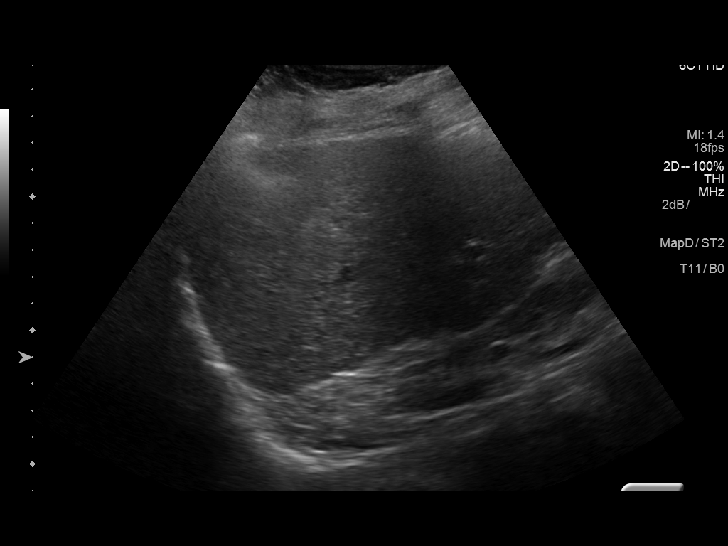
[im 39/47]
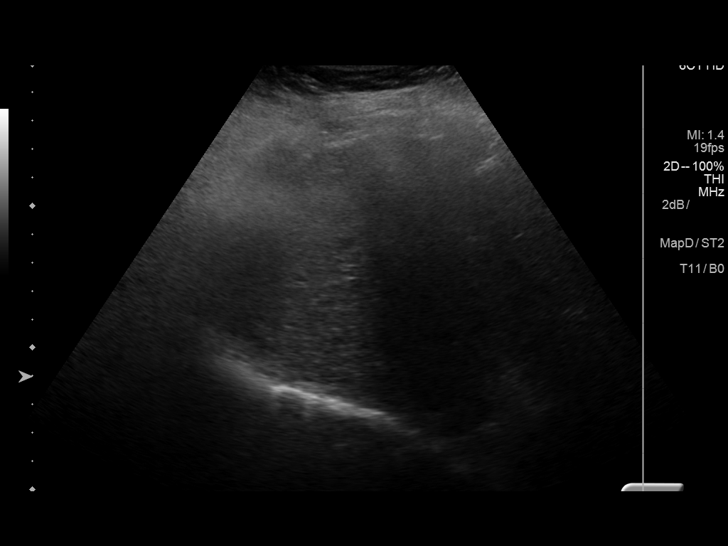
[im 43/47]
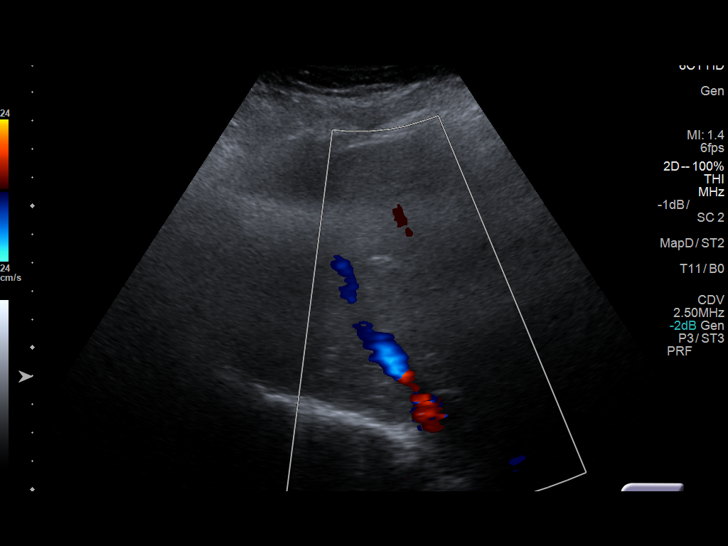
[im 47/47]
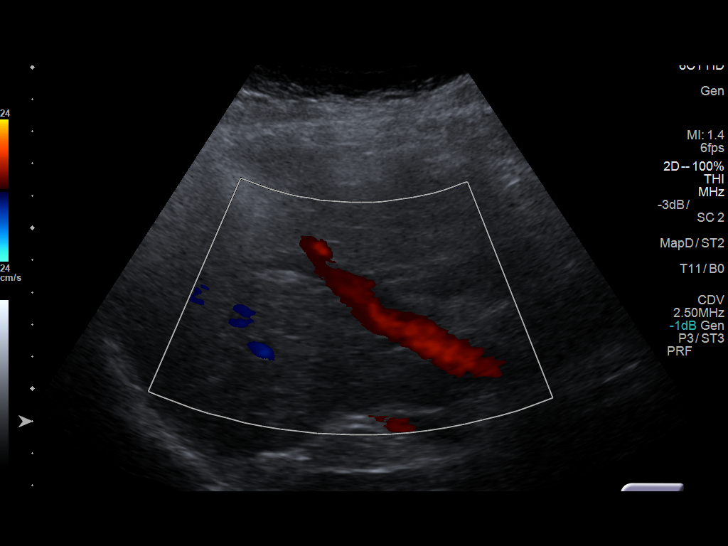

[14 of 25 positions shown; findings below may reference images not displayed]

FINDINGS: Gallbladder:

Very questionable small gallstones and/or sludge noted. Gallstones
noted on prior CT of 07/09/2015 . Gallbladder wall thickness 3 mm.
No pericholecystic fluid collection.

Common bile duct:

Diameter: 4 mm

Liver:

Liver has a heterogeneous irregular parenchymal pattern suggesting
cirrhosis. No focal hepatic abnormality identified .
IMPRESSION: 1. Very questionable small gallstones and/or sludge noted. Tiny
gallstones noted on prior CT of 07/09/2015. No biliary distention.

2. Liver again has a heterogeneous nodular parenchymal pattern
consistent with cirrhosis. No focal hepatic abnormality identified.

## 2017-12-01 DIAGNOSIS — Z1389 Encounter for screening for other disorder: Secondary | ICD-10-CM | POA: Diagnosis not present

## 2017-12-01 DIAGNOSIS — M5136 Other intervertebral disc degeneration, lumbar region: Secondary | ICD-10-CM | POA: Diagnosis not present

## 2017-12-01 DIAGNOSIS — M1991 Primary osteoarthritis, unspecified site: Secondary | ICD-10-CM | POA: Diagnosis not present

## 2017-12-01 DIAGNOSIS — I2510 Atherosclerotic heart disease of native coronary artery without angina pectoris: Secondary | ICD-10-CM | POA: Diagnosis not present

## 2017-12-02 ENCOUNTER — Other Ambulatory Visit: Payer: Self-pay

## 2017-12-10 DIAGNOSIS — Z7409 Other reduced mobility: Secondary | ICD-10-CM | POA: Diagnosis not present

## 2017-12-10 DIAGNOSIS — I69364 Other paralytic syndrome following cerebral infarction affecting left non-dominant side: Secondary | ICD-10-CM | POA: Diagnosis not present

## 2017-12-10 NOTE — Progress Notes (Unsigned)
This encounter was created in error - please disregard.

## 2017-12-14 ENCOUNTER — Other Ambulatory Visit: Payer: Self-pay

## 2017-12-14 DIAGNOSIS — Z1389 Encounter for screening for other disorder: Secondary | ICD-10-CM | POA: Diagnosis not present

## 2017-12-14 DIAGNOSIS — E782 Mixed hyperlipidemia: Secondary | ICD-10-CM | POA: Diagnosis not present

## 2017-12-14 DIAGNOSIS — G4700 Insomnia, unspecified: Secondary | ICD-10-CM | POA: Diagnosis not present

## 2017-12-14 DIAGNOSIS — Z6823 Body mass index (BMI) 23.0-23.9, adult: Secondary | ICD-10-CM | POA: Diagnosis not present

## 2017-12-14 DIAGNOSIS — K219 Gastro-esophageal reflux disease without esophagitis: Secondary | ICD-10-CM | POA: Diagnosis not present

## 2017-12-14 DIAGNOSIS — R600 Localized edema: Secondary | ICD-10-CM | POA: Diagnosis not present

## 2017-12-14 DIAGNOSIS — J309 Allergic rhinitis, unspecified: Secondary | ICD-10-CM | POA: Diagnosis not present

## 2017-12-14 DIAGNOSIS — E1140 Type 2 diabetes mellitus with diabetic neuropathy, unspecified: Secondary | ICD-10-CM | POA: Diagnosis not present

## 2017-12-14 DIAGNOSIS — M5136 Other intervertebral disc degeneration, lumbar region: Secondary | ICD-10-CM | POA: Diagnosis not present

## 2017-12-14 NOTE — Patient Outreach (Signed)
[place] [name]  12/14/2017  [name] 16-Jan-1941 [id number]   1st unsuccessful telephone outreach to the patient for initial assessment.  Son answered the phone and stated that the patient was leaving to go to an appointment.  HIPAA compliant message left with contact information.  Plan:  RN Health Coach will make and out reach attempt to the patient in three to four business days.  Lazaro Arms RN, BSN, [place] Direct Dial:  [phone]  Fax: [phone]

## 2017-12-18 ENCOUNTER — Other Ambulatory Visit: Payer: Self-pay

## 2017-12-18 NOTE — Patient Outreach (Signed)
[place] [facility]) Care Management  12/18/2017  [name] [birth year] [id number]   2nd outreach attempt to the patient for initial assessment.  The patient answered the phone and stated that she was unable to talk with me at this time.  Plan: RN Health Coach will send letter. RN Health Coach will make outreach attempt in three to four business days.    Lazaro Arms RN, BSN, [place] Direct Dial:  [phone]  Fax: [phone]

## 2017-12-24 ENCOUNTER — Other Ambulatory Visit: Payer: Self-pay | Admitting: *Deleted

## 2017-12-24 ENCOUNTER — Other Ambulatory Visit: Payer: Self-pay

## 2017-12-24 NOTE — Patient Outreach (Signed)
[place] [name]  12/24/2017  [name] 07/14/1941 [id number]   Care coordination/collaboration with [facility] health coach    [facility] CM contacted by [facility] health coach. Collaborated about [name].   CM called and left a voice message for [name]. CM returned a call and spoke with [name] about voiced concerns from Health coach.  Explained the need for the health coach to verify her identity, consent and HIPPA standards. CM assessed [name] for an update of her care.  Cm was informed she has not seen Dr Gerarda Fraction nor [name] since Cm last visit.  [name] states Dr Gerarda Fraction has ordered home health services "about a month ago but I was told by the doctor in [place] to wait a little longer for this hip to heal to start therapy again, " " the doctor in [place] released me."  The "[facility] doctor" she is referring to is [name].  She reports she is still "not walking but using my wheelchair."CM encouraged an office visit to Dr Gerarda Fraction, primary care provider.  CM confirmed with [name] that she is still willing to participate with [facility] health coach services. She voiced that she understood health coach services THN Cm updated [place] coach that [name] requests that "she can call me any time." and she is willing to participate in [place]. Lavina Hamman, RN, BSN, [place] Coordinator [phone] week day mobile

## 2017-12-24 NOTE — Patient Outreach (Addendum)
[place] [facility] Care Management  12/24/2017  [name] 08-20-1941 [id number]   3 rd Telephone outreachto the patient for initial assessment.  HIPAA verified.  The patient answered the phone and as I started to ask the assessment questions the phone was hung up.  Plan: RN Health Coach has made three outreach attempt with no success.  Will close the case due to patient withdrew from program  [place], BSN, [place]:  [phone]  Fax: [phone]

## 2017-12-28 ENCOUNTER — Other Ambulatory Visit: Payer: Self-pay

## 2017-12-28 NOTE — Patient Outreach (Signed)
[place] [facility]) Care Management  [place]  12/28/2017   [name] Aug 25, 1941 [id number]  Subjective: Telephone call to the patient for initial assessment. HIPAA verified.  The patient states that she lives alone.  She has a son and grandchildren she states  is supportive and visits her everyday.  She verbalized that she is independent with her ADLS but is dependent with her IADLS.  She has transportation to her appointments.  The patient states that she has wheel chair, blood pressure cuff, scale bedside commode and shower chair in the home.  The patient states That she smokes a pack a cigarettes a week.  RN discussed with the patient about smoking and the affect on her health.  The patient verbalized understanding  Per chart review the patient's conditions are HTN, CAD, TIA, OSA, GERD, and CHF.  The patient states that she can manage and afford her medications.  The patient has an appointment with Dr Gerarda Fraction on 12/14/2017  The patient states that she does not have a health care power of attorney but would like the information sent to her.  Encounter Medications:  Outpatient Encounter Medications as of 12/28/2017  Medication Sig Note  . aspirin EC 81 MG tablet Take 81 mg by mouth daily.   Marland Kitchen atorvastatin (LIPITOR) 40 MG tablet Take 40 mg by mouth daily.   . carvedilol (COREG) 12.5 MG tablet Take 1 tablet (12.5 mg total) by mouth 2 (two) times daily with a meal.   . diltiazem (CARDIZEM CD) 120 MG 24 hr capsule Take 120 mg by mouth daily.   . DiphenhydrAMINE HCl, Sleep, (SLEEP AID) 25 MG CAPS Take 1-2 capsules by mouth at bedtime as needed (for sleep).   . docusate sodium (COLACE) 100 MG capsule Take 100 mg by mouth daily.    . furosemide (LASIX) 20 MG tablet Take 20 mg by mouth daily.   Marland Kitchen lisinopril (PRINIVIL,ZESTRIL) 20 MG tablet Take 20 mg by mouth daily.   . ondansetron (ZOFRAN) 4 MG tablet Take 1 tablet (4 mg total) by mouth every 8 (eight) hours as needed for  nausea or vomiting.   . phenytoin (DILANTIN) 200 MG ER capsule Take 1 capsule (200 mg total) by mouth daily.   . ranitidine (ZANTAC) 150 MG tablet Take 150 mg by mouth 2 (two) times daily.   . traMADol (ULTRAM) 50 MG tablet Take 50 mg by mouth 4 (four) times daily. 10 day course starting on 09/10/17   . zolpidem (AMBIEN) 10 MG tablet take 1 tablet by mouth at bedtime if needed   . HYDROcodone-acetaminophen (NORCO) 5-325 MG tablet Take 1 tablet by mouth every 6 (six) hours as needed for moderate pain or severe pain. (Patient not taking: Reported on 11/20/2017) 11/20/2017: Ran out and not taking MD requires pt come in for follow up before prescribing   . HYDROcodone-acetaminophen (NORCO) 5-325 MG tablet Take 1-2 tablets by mouth every 4 (four) hours as needed. (Patient not taking: Reported on 11/20/2017) 11/20/2017: Ran out and not taking MD requires pt come in for follow up before prescribing   . isosorbide mononitrate (IMDUR) 30 MG 24 hr tablet Take 30 mg by mouth daily.    . pantoprazole (PROTONIX) 40 MG tablet Take 40 mg by mouth daily.   . [DISCONTINUED] promethazine (PHENERGAN) 12.5 MG tablet Take 1 tablet (12.5 mg total) by mouth every 6 (six) hours as needed for nausea or vomiting. (Patient not taking: Reported on 12/25/2015)    No facility-administered encounter medications  on file as of 12/28/2017.     Functional Status:  In your present state of health, do you have any difficulty performing the following activities: 09/15/2017 08/24/2017  Hearing? N N  Vision? Y N  Difficulty concentrating or making decisions? N N  Walking or climbing stairs? Y Y  Dressing or bathing? Y N  Doing errands, shopping? Y N  Preparing Food and eating ? Y -  Using the Toilet? Y -  In the past six months, have you accidently leaked urine? N -  Do you have problems with loss of bowel control? N -  Managing your Medications? N -  Managing your Finances? N -  Housekeeping or managing your Housekeeping? Y -  Some  recent data might be hidden    Fall/Depression Screening: Fall Risk  12/28/2017 09/15/2017 07/06/2017  Falls in the past year? Yes Yes Yes  Number falls in past yr: 1 2 or more 2 or more  Injury with Fall? - Yes Yes  Risk Factor Category  - High Fall Risk High Fall Risk  Risk for fall due to : History of fall(s);Impaired balance/gait History of fall(s);Medication side effect;Impaired balance/gait;Impaired mobility;Impaired vision History of fall(s)  Risk for fall due to: Comment - - history of one fall in 2018  Follow up - Falls evaluation completed;Education provided;Follow up appointment;Falls prevention discussed -   PHQ 2/9 Scores 12/28/2017 09/15/2017 07/06/2017 03/09/2017  PHQ - 2 Score 0 1 0 0    Assessment: Patient will benefit from health coach outreach for disease management and support.   THN CM Care Plan Problem One     Most Recent Value  Care Plan Problem One  Knowledge deficit releated to diease management  Role Documenting the Problem One  [place] for Problem One  Active  THN Long Term Goal   In 90 days the patient will verbalize that she hasnot been to the emergency room.  THN Long Term Goal Start Date  12/28/17  Interventions for Problem One Long Term Goal  [facility] reviewed with patient signs and symptoms of heart failure  THN CM Short Term Goal #1   In 30 days the patient will verbalize that she is weighing daily.  THN CM Short Term Goal #1 Start Date  12/28/17  Interventions for Short Term Goal #1  [facility] discussed with patient the importance of daily weight monitoring and when to notify the physician of weight gain,.  THN CM Short Term Goal #2   In 30 days the pateint will verbalize following a low salt diet  THN CM Short Term Goal #2 Start Date  12/28/17  Interventions for Short Term Goal #2  [facility] discussed the importance of monitoring salt and how it can affect her condition.     Plan: RN Health Coach will provide ongoing education for patient on heart  failure through phone calls and sending printed information to patient for further discussion.  RN Health Coach will send welcome packet with consent to patient as well as printed information on heart failure.  RN Health Coach will send initial barriers letter, assessment, and care plan to primary care physician. RN Health Coach will contact patient in the month of May and patient agrees to next outreach.   Lazaro Arms RN, BSN, [place] Direct Dial:  [phone]  Fax: [phone]

## 2018-01-09 DIAGNOSIS — Z7409 Other reduced mobility: Secondary | ICD-10-CM | POA: Diagnosis not present

## 2018-01-09 DIAGNOSIS — I69364 Other paralytic syndrome following cerebral infarction affecting left non-dominant side: Secondary | ICD-10-CM | POA: Diagnosis not present

## 2018-01-11 ENCOUNTER — Emergency Department (HOSPITAL_COMMUNITY)
Admission: EM | Admit: 2018-01-11 | Discharge: 2018-01-11 | Disposition: A | Discharge status: Home / Self Care | Payer: Medicare Other | Attending: Emergency Medicine | Admitting: Emergency Medicine

## 2018-01-11 ENCOUNTER — Emergency Department (HOSPITAL_COMMUNITY): Payer: Medicare Other

## 2018-01-11 ENCOUNTER — Encounter (HOSPITAL_COMMUNITY): Payer: Self-pay | Admitting: Emergency Medicine

## 2018-01-11 ENCOUNTER — Other Ambulatory Visit: Payer: Self-pay

## 2018-01-11 DIAGNOSIS — Z7982 Long term (current) use of aspirin: Secondary | ICD-10-CM | POA: Diagnosis not present

## 2018-01-11 DIAGNOSIS — I10 Essential (primary) hypertension: Secondary | ICD-10-CM | POA: Insufficient documentation

## 2018-01-11 DIAGNOSIS — E119 Type 2 diabetes mellitus without complications: Secondary | ICD-10-CM | POA: Insufficient documentation

## 2018-01-11 DIAGNOSIS — R109 Unspecified abdominal pain: Secondary | ICD-10-CM | POA: Diagnosis not present

## 2018-01-11 DIAGNOSIS — R1110 Vomiting, unspecified: Secondary | ICD-10-CM | POA: Diagnosis not present

## 2018-01-11 DIAGNOSIS — F17210 Nicotine dependence, cigarettes, uncomplicated: Secondary | ICD-10-CM | POA: Insufficient documentation

## 2018-01-11 DIAGNOSIS — R748 Abnormal levels of other serum enzymes: Secondary | ICD-10-CM | POA: Insufficient documentation

## 2018-01-11 DIAGNOSIS — Z8673 Personal history of transient ischemic attack (TIA), and cerebral infarction without residual deficits: Secondary | ICD-10-CM | POA: Insufficient documentation

## 2018-01-11 DIAGNOSIS — I252 Old myocardial infarction: Secondary | ICD-10-CM | POA: Diagnosis not present

## 2018-01-11 DIAGNOSIS — I2510 Atherosclerotic heart disease of native coronary artery without angina pectoris: Secondary | ICD-10-CM | POA: Diagnosis not present

## 2018-01-11 DIAGNOSIS — K838 Other specified diseases of biliary tract: Secondary | ICD-10-CM | POA: Diagnosis not present

## 2018-01-11 DIAGNOSIS — Z79899 Other long term (current) drug therapy: Secondary | ICD-10-CM | POA: Insufficient documentation

## 2018-01-11 DIAGNOSIS — R112 Nausea with vomiting, unspecified: Secondary | ICD-10-CM | POA: Diagnosis not present

## 2018-01-11 DIAGNOSIS — K8020 Calculus of gallbladder without cholecystitis without obstruction: Secondary | ICD-10-CM | POA: Diagnosis not present

## 2018-01-11 LAB — URINALYSIS, ROUTINE W REFLEX MICROSCOPIC
Bilirubin Urine: NEGATIVE
Glucose, UA: NEGATIVE mg/dL
KETONES UR: NEGATIVE mg/dL
LEUKOCYTES UA: NEGATIVE
Nitrite: NEGATIVE
PROTEIN: NEGATIVE mg/dL
Specific Gravity, Urine: 1.005 (ref 1.005–1.030)
pH: 8 (ref 5.0–8.0)

## 2018-01-11 LAB — COMPREHENSIVE METABOLIC PANEL
ALBUMIN: 3.6 g/dL (ref 3.5–5.0)
ALT: 52 U/L (ref 14–54)
AST: 99 U/L — AB (ref 15–41)
Alkaline Phosphatase: 165 U/L — ABNORMAL HIGH (ref 38–126)
Anion gap: 8 (ref 5–15)
BILIRUBIN TOTAL: 1.6 mg/dL — AB (ref 0.3–1.2)
BUN: 9 mg/dL (ref 6–20)
CO2: 28 mmol/L (ref 22–32)
Calcium: 9.2 mg/dL (ref 8.9–10.3)
Chloride: 104 mmol/L (ref 101–111)
Creatinine, Ser: 0.73 mg/dL (ref 0.44–1.00)
GFR calc Af Amer: >60 mL/min (ref >60)
GFR calc non Af Amer: >60 mL/min (ref >60)
GLUCOSE: 100 mg/dL — AB (ref 65–99)
POTASSIUM: 3.2 mmol/L — AB (ref 3.5–5.1)
Sodium: 140 mmol/L (ref 135–145)
Total Protein: 6.5 g/dL (ref 6.5–8.1)

## 2018-01-11 LAB — CBC
HEMATOCRIT: 38.5 % (ref 36.0–46.0)
Hemoglobin: 12.7 g/dL (ref 12.0–15.0)
MCH: 30.6 pg (ref 26.0–34.0)
MCHC: 33 g/dL (ref 30.0–36.0)
MCV: 92.8 fL (ref 78.0–100.0)
Platelets: 124 10*3/uL — ABNORMAL LOW (ref 150–400)
RBC: 4.15 MIL/uL (ref 3.87–5.11)
RDW: 13.1 % (ref 11.5–15.5)
WBC: 7 10*3/uL (ref 4.0–10.5)

## 2018-01-11 LAB — LIPASE, BLOOD: Lipase: 393 U/L — ABNORMAL HIGH (ref 11–51)

## 2018-01-11 MED ORDER — ONDANSETRON 4 MG PO TBDP: 4.0000 mg | ORAL_TABLET | Freq: Three times a day (TID) | ORAL | 1 refills | Status: DC | PRN

## 2018-01-11 MED ORDER — PANTOPRAZOLE SODIUM 40 MG IV SOLR
40.0000 mg | Freq: Once | INTRAVENOUS | Status: AC
  Administered 2018-01-11: 40 mg via INTRAVENOUS
  Filled 2018-01-11: qty 40

## 2018-01-11 MED ORDER — IOPAMIDOL (ISOVUE-300) INJECTION 61%
100.0000 mL | Freq: Once | INTRAVENOUS | Status: AC | PRN
  Administered 2018-01-11: 100 mL via INTRAVENOUS

## 2018-01-11 MED ORDER — ONDANSETRON HCL 4 MG/2ML IJ SOLN
4.0000 mg | Freq: Once | INTRAMUSCULAR | Status: AC
  Administered 2018-01-11: 4 mg via INTRAVENOUS
  Filled 2018-01-11: qty 2

## 2018-01-11 MED ORDER — SODIUM CHLORIDE 0.9 % IV BOLUS
1000.0000 mL | Freq: Once | INTRAVENOUS | Status: AC
  Administered 2018-01-11: 1000 mL via INTRAVENOUS

## 2018-01-11 NOTE — ED Notes (Signed)
Patient transported to Ultrasound 

## 2018-01-11 NOTE — ED Triage Notes (Addendum)
Pt c/o of n/v since 3 am. No blood in emesis of abdominal pain.  Pt took 70m zofran  At 0330 and 5 mg promethazine at 0430.

## 2018-01-11 NOTE — ED Notes (Signed)
Pt returned from CT °

## 2018-01-11 NOTE — Discharge Instructions (Addendum)
Your lipase is elevated.  This is a chemical associated with your pancreas and gallbladder.  Follow-up with Dr. Laural Golden tomorrow.  Prescription for nausea medication.  Return if worse.

## 2018-01-11 NOTE — ED Notes (Signed)
EDP at bedside  

## 2018-01-11 NOTE — ED Notes (Signed)
Pt requesting to go home. EDP notified.

## 2018-01-11 NOTE — ED Notes (Signed)
EDP at bedside updating patient. 

## 2018-01-11 NOTE — ED Provider Notes (Signed)
[facility] EMERGENCY DEPARTMENT Provider Note   CSN: [id number] Arrival date & time: 01/11/18  0803     History   Chief Complaint Chief Complaint  Patient presents with  . Emesis    HPI [name] is a 77 y.o. female.  Patient complains of several episodes of vomiting since 3 AM today.  She took Zofran and Phenergan which helped some.  No abdominal pain.  She has a known history of non-alcohol-related cirrhosis.  No history of gallbladder disease.  Nothing makes symptoms better or worse.  Severity is moderate.     Past Medical History:  Diagnosis Date  . Anemia   . Aortic insufficiency    Moderate  . Back pain   . Carotid stenosis, bilateral   . Cirrhosis ([place])   . Closed left femoral fracture ([place]) 04/21/2017  . Coronary artery disease    Stent x 2 RCA 1995, cardiac catheterization 09/2016 showing only mild atherosclerosis  . DDD (degenerative disc disease), lumbar   . Essential hypertension   . Falls   . GERD (gastroesophageal reflux disease)   . Hiatal hernia   . History of stroke   . Hypercholesteremia   . IBS (irritable bowel syndrome)   . Non-alcoholic fatty liver disease   . OSA (obstructive sleep apnea)    no CPAP  . Pericardial effusion    a. small by echo 2018.  Marland Kitchen PSVT (paroxysmal supraventricular tachycardia) ([place])   . Recurrent UTI   . Seizures ([place])   . Stroke ([place])   . Type 2 diabetes mellitus ([place])    off medication  . Varices, esophageal [facility])     Patient Active Problem List   Diagnosis Date Noted  . Syncope and collapse   . Syncope 08/24/2017  . Closed left femoral fracture ([place]) 04/21/2017  . Seizure disorder ([place]) 04/21/2017  . Closed pertrochanteric fracture of femur, left, initial encounter ([place]) 03/02/2017  . Seizure ([place]) 02/28/2017  . Hip fracture ([place]) 02/28/2017  . Chest pain 12/07/2016  . Atypical chest pain 12/07/2016  . Tachycardia   . NSTEMI (non-ST elevated myocardial infarction) ([place]) 11/02/2016  . Multifocal  atrial tachycardia ([place]) 09/25/2016  . Elevated troponin 09/24/2016  . Hypokalemia 09/23/2016  . TIA (transient ischemic attack) 05/05/2016  . Intractable nausea and vomiting 10/15/2015  . Acute coronary syndrome ([place]) 06/01/2015  . Bronchitis 06/01/2015  . Thrombocytopenia ([place]) 06/01/2015  . Cerebral infarction ([place]) 03/06/2014  . Lower urinary tract infectious disease   . PSVT (paroxysmal supraventricular tachycardia) ([place]) 12/09/2013  . Esophageal varices ([place]) 03/29/2013  . Hematemesis 03/29/2013  . Hepatic cirrhosis ([place]) 03/29/2013  . Presence of stent in right coronary artery 07/04/2011  . OSA (obstructive sleep apnea) 07/04/2011  . Aortic sclerosis 07/04/2011  . Aortic insufficiency 07/04/2011  . HTN (hypertension) 07/04/2011  . Carotid stenosis, bilateral 07/04/2011  . Chest pain at rest 07/03/2011  . CAD (coronary artery disease) 07/03/2011  . DM type 2 (diabetes mellitus, type 2) ([place]) 07/03/2011  . Hypercholesteremia 07/03/2011  . GERD (gastroesophageal reflux disease) 07/03/2011    Past Surgical History:  Procedure Laterality Date  . [place]  . APPENDECTOMY    . BACK SURGERY  1985  . CARDIAC CATHETERIZATION  [phone]   Stent to the proximal RCA after MI   . CARDIAC CATHETERIZATION N/A 09/26/2016   Procedure: Left Heart Cath and Coronary Angiography;  Surgeon: Leonie Man, MD;  Location: [place] CV LAB;  Service: Cardiovascular;  Laterality: N/A;  .  CATARACT EXTRACTION W/PHACO Right 07/04/2014   Procedure: CATARACT EXTRACTION PHACO AND INTRAOCULAR LENS PLACEMENT (IOC);  Surgeon: Elta Guadeloupe T. Gershon Crane, MD;  Location: [facility] OR[facility];  Service: Ophthalmology;  Laterality: Right;  CDE:13.13  . COLONOSCOPY    . DILATION AND CURETTAGE OF UTERUS     x2  . Epi Retinal Membrane Peel Left   . ERCP    . ESOPHAGEAL BANDING N/A 04/01/2013   Procedure: ESOPHAGEAL BANDING;  Surgeon: Rogene Houston, MD;  Location: [facility] ENDO SU[facility];  Service: Endoscopy;   Laterality: N/A;  . ESOPHAGEAL BANDING N/A 05/24/2013   Procedure: ESOPHAGEAL BANDING;  Surgeon: Rogene Houston, MD;  [facility];  Service: Endoscopy;  Laterality: N/A;  . ESOPHAGEAL BANDING N/A 06/21/2014   Procedure: ESOPHAGEAL BANDING;  Surgeon: Rogene Houston, MD;  [facility];  Service: Endoscopy;  Laterality: N/A;  . ESOPHAGOGASTRODUODENOSCOPY N/A 04/01/2013   Procedure: ESOPHAGOGASTRODUODENOSCOPY (EGD);  Surgeon: Rogene Houston, MD;  [facility];  Service: Endoscopy;  Laterality: N/A;  230-rescheduled to 8:30am Ann notified pt  . ESOPHAGOGASTRODUODENOSCOPY N/A 05/24/2013   Procedure: ESOPHAGOGASTRODUODENOSCOPY (EGD);  Surgeon: Rogene Houston, MD;  [facility];  Service: Endoscopy;  Laterality: N/A;  730  . ESOPHAGOGASTRODUODENOSCOPY N/A 06/21/2014   Procedure: ESOPHAGOGASTRODUODENOSCOPY (EGD);  Surgeon: Rogene Houston, MD;  [facility];  Service: Endoscopy;  Laterality: N/A;  930-rescheduled 10/14 @ 1200 Ann to notify pt  . EYE SURGERY  08   cataract surgery of the left eye  . FEMUR IM NAIL Left 03/02/2017   Procedure: INTRAMEDULLARY (IM) NAIL FEMORAL;  Surgeon: Rod Can, MD;  Location: [place];  Service: Orthopedics;  Laterality: Left;  . HARDWARE REMOVAL Right 01/17/2013   Procedure: REMOVAL OF HARDWARE AND EXCISION ULNAR STYLOID RIGHT WRIST;  Surgeon: Tennis Must, MD;  Location: [place];  Service: Orthopedics;  Laterality: Right;  . MYRINGOTOMY  2012   both ears  . ORIF FEMUR FRACTURE Left 04/23/2017   Procedure: OPEN REDUCTION INTERNAL FIXATION (ORIF) DISTAL FEMUR FRACTURE (FRACTURE AROUND FEMORAL NAIL);  Surgeon: Altamese Frederic, MD;  Location: [place];  Service: Orthopedics;  Laterality: Left;  . TONSILLECTOMY    . VAGINAL HYSTERECTOMY  1972  . WRIST SURGERY     rt wrist hardwear removal     OB History   None      Home Medications    Prior to Admission medications   Medication Sig Start  Date End Date Taking? Authorizing Provider  aspirin EC 81 MG tablet Take 81 mg by mouth daily.   Yes Jake Samples, PA-C  atorvastatin (LIPITOR) 40 MG tablet Take 40 mg by mouth daily.   Yes Jake Samples, PA-C  carvedilol (COREG) 12.5 MG tablet Take 1 tablet (12.5 mg total) by mouth 2 (two) times daily with a meal. 08/26/17  Yes Tat, Shanon Brow, MD  ciprofloxacin (CIPRO) 500 MG tablet Take 1 tablet by mouth 2 (two) times daily. 01/06/18  Yes [provider]  diltiazem (CARDIZEM CD) 120 MG 24 hr capsule Take 120 mg by mouth daily.   Yes Delman Cheadle J, PA-C  DiphenhydrAMINE HCl, Sleep, (SLEEP AID) 25 MG CAPS Take 1-2 capsules by mouth at bedtime as needed (for sleep).   Yes [provider]  furosemide (LASIX) 20 MG tablet Take 20 mg by mouth daily.   Yes [provider]  isosorbide mononitrate (IMDUR) 30 MG 24 hr tablet Take 30 mg by mouth daily.  Yes Jake Samples, PA-C  lisinopril (PRINIVIL,ZESTRIL) 20 MG tablet Take 20 mg by mouth daily.   Yes Delman Cheadle J, PA-C  ondansetron (ZOFRAN) 4 MG tablet Take 1 tablet (4 mg total) by mouth every 8 (eight) hours as needed for nausea or vomiting. 09/10/17  Yes Setzer, Rona Ravens, NP  phenytoin (DILANTIN) 200 MG ER capsule Take 1 capsule (200 mg total) by mouth daily. 08/27/17  Yes Tat, Shanon Brow, MD  zolpidem (AMBIEN) 10 MG tablet take 1 tablet by mouth at bedtime if needed 05/01/17  Yes [provider]  HYDROcodone-acetaminophen (NORCO) 5-325 MG tablet Take 1 tablet by mouth every 6 (six) hours as needed for moderate pain or severe pain. Patient not taking: Reported on 11/20/2017 09/10/17   Orlie Dakin, MD  HYDROcodone-acetaminophen (NORCO) 5-325 MG tablet Take 1-2 tablets by mouth every 4 (four) hours as needed. Patient not taking: Reported on 11/20/2017 09/18/17   Elnora Morrison, MD  ondansetron (ZOFRAN ODT) 4 MG disintegrating tablet Take 1 tablet (4 mg total) by mouth every 8 (eight) hours as needed for  nausea or vomiting. 01/11/18   Nat Christen, MD  promethazine (PHENERGAN) 12.5 MG tablet Take 1 tablet (12.5 mg total) by mouth every 6 (six) hours as needed for nausea or vomiting. Patient not taking: Reported on 12/25/2015 10/13/15 12/25/15  Julianne Rice, MD    Family History Family History  Problem Relation Age of Onset  . Diabetes Mother   . Heart failure Father   . Heart failure Maternal Aunt     Social History Social History   Tobacco Use  . Smoking status: Current Some Day Smoker    Packs/day: 0.25    Years: 50.00    Pack years: 12.50    Types: Cigarettes    Last attempt to quit: 01/13/2012    Years since quitting: 6.0  . Smokeless tobacco: Never Used  Substance Use Topics  . Alcohol use: No    Alcohol/week: 0.0 oz  . Drug use: No     Allergies   Tape   Review of Systems Review of Systems  All other systems reviewed and are negative.    Physical Exam Updated Vital Signs BP (!) 191/56 (BP Location: Right Arm)   Pulse 77   Temp 98.7 F (37.1 C) (Oral)   Resp 18   Ht 5' 3"  (1.6 m)   Wt 62.6 kg (138 lb)   SpO2 98%   BMI 24.45 kg/m   Physical Exam  Constitutional: She is oriented to person, place, and time. She appears well-developed and well-nourished.  HENT:  Head: Normocephalic and atraumatic.  Eyes: Conjunctivae are normal.  Neck: Neck supple.  Cardiovascular: Normal rate and regular rhythm.  Pulmonary/Chest: Effort normal and breath sounds normal.  Abdominal: Soft. Bowel sounds are normal.  Nontender epigastrium.  Musculoskeletal: Normal range of motion.  Neurological: She is alert and oriented to person, place, and time.  Skin: Skin is warm and dry.  Psychiatric: She has a normal mood and affect. Her behavior is normal.  Nursing note and vitals reviewed.    ED Treatments / Results  Labs (all labs ordered are listed, but only abnormal results are displayed) Labs Reviewed  LIPASE, BLOOD - Abnormal; Notable for the following components:       Result Value   Lipase 393 (*)    All other components within normal limits  COMPREHENSIVE METABOLIC PANEL - Abnormal; Notable for the following components:   Potassium 3.2 (*)    Glucose, Bld  100 (*)    AST 99 (*)    Alkaline Phosphatase 165 (*)    Total Bilirubin 1.6 (*)    All other components within normal limits  CBC - Abnormal; Notable for the following components:   Platelets 124 (*)    All other components within normal limits  URINALYSIS, ROUTINE W REFLEX MICROSCOPIC - Abnormal; Notable for the following components:   APPearance HAZY (*)    Hgb urine dipstick SMALL (*)    Bacteria, UA RARE (*)    All other components within normal limits    EKG None  Radiology Ct Abdomen Pelvis W Contrast  Result Date: 01/11/2018 CLINICAL DATA:  Nausea and vomiting. No abdominal pain. Elevated lipase. EXAM: CT ABDOMEN AND PELVIS WITH CONTRAST TECHNIQUE: Multidetector CT imaging of the abdomen and pelvis was performed using the standard protocol following bolus administration of intravenous contrast. CONTRAST:  180m ISOVUE-300 IOPAMIDOL (ISOVUE-300) INJECTION 61% COMPARISON:  CT pelvis dated September 10, 2017. CT abdomen pelvis dated July 09, 2015. FINDINGS: Lower chest: No acute abnormality. 8 mm right lower lobe pulmonary nodule abutting the diaphragm, unchanged since October 2016, benign. Dense mitral annulus calcification. Hepatobiliary: Nodular contour of the liver, consistent with cirrhosis. No focal liver abnormality. Small layering gallstones in the gallbladder. Mild gallbladder wall thickening. No biliary dilatation. Pancreas: Unremarkable. No pancreatic ductal dilatation or surrounding inflammatory changes. Spleen: Normal in size.  Old granulomatous disease. Adrenals/Urinary Tract: The adrenal glands are unremarkable. Unchanged 1.8 cm simple cyst in the left kidney. No renal or ureteral calculi. No hydronephrosis. The bladder is unremarkable. Stomach/Bowel: Stomach is within normal  limits. Appendix is surgically absent. No evidence of bowel wall thickening, distention, or inflammatory changes. Colonic diverticulosis. Moderate stool burden throughout the colon. Vascular/Lymphatic: Unchanged paraesophageal varices. Large splenorenal shunt. The portal system is patent. Aortic atherosclerosis. No lymphadenopathy. Reproductive: Status post hysterectomy. No adnexal masses. Other: Trace free fluid in the pelvis. Mild haziness of the mesentery. No pneumoperitoneum. Musculoskeletal: Unchanged insufficiency fracture of the left sacral ala. New insufficiency fracture of the right sacral ala. Healing left superior and inferior pubic rami fractures. Grade 1/2 anterolisthesis at L5-S1 due to bilateral L5 pars defects, similar to prior study. IMPRESSION: 1. Cholelithiasis. Mild gallbladder wall thickening may be related to underlying liver disease. If there is clinical concern for acute cholecystitis, consider further evaluation with right upper quadrant ultrasound. 2. Cirrhosis with sequelae of portal hypertension including esophageal varices and large splenorenal portosystemic shunt. 3. New insufficiency fracture of the right sacral ala. Unchanged insufficiency fracture of the left sacral ala. 4. Healing left superior and inferior pubic rami fractures. 5.  Aortic atherosclerosis (ICD10-I70.0). Electronically Signed   By: WTitus DubinM.D.   On: 01/11/2018 13:26   [place]domen Limited  Result Date: 01/11/2018 CLINICAL DATA:  Nausea and vomiting, gallstones. Mild gallbladder wall thickening on abdominal CT today. History of cirrhosis, fatty liver disease, diabetes. EXAM: ULTRASOUND ABDOMEN LIMITED RIGHT UPPER QUADRANT COMPARISON:  Right upper quadrant abdominal ultrasound of June 30, 2016 FINDINGS: Gallbladder: The gallbladder is adequately distended. There is echogenic bile or sludge. There may be a tiny polyp on image 14. No discrete stones are observed. There is no gallbladder wall thickening,  pericholecystic fluid, or positive sonographic Murphy's sign. Common bile duct: Diameter: Visualization of the common bile duct was difficult due to bowel gas but it appears to measure 3 mm in diameter. Liver: The hepatic echotexture is heterogeneously increased. The surface contour is nodular. There is no focal mass nor ductal  dilation. Portal vein is patent on color Doppler imaging with normal direction of blood flow towards the liver. IMPRESSION: Gallbladder sludge.  No sonographic evidence of acute cholecystitis. Top-normal common bile duct diameter where visualized. Heterogeneously increased hepatic echotexture with surface contour nodularity consistent with known cirrhosis. Electronically Signed   By: David  Martinique M.D.   On: 01/11/2018 14:41    Procedures Procedures (including critical care time)  Medications Ordered in ED Medications  sodium chloride 0.9 % bolus 1,000 mL (0 mLs Intravenous Stopped 01/11/18 1043)  ondansetron (ZOFRAN) injection 4 mg (4 mg Intravenous Given 01/11/18 0921)  pantoprazole (PROTONIX) injection 40 mg (40 mg Intravenous Given 01/11/18 0921)  iopamidol (ISOVUE-300) 61 % injection 100 mL (100 mLs Intravenous Contrast Given 01/11/18 1253)     Initial Impression / Assessment and Plan / ED Course  I have reviewed the triage vital signs and the nursing notes.  Pertinent labs & imaging results that were available during my care of the patient were reviewed by me and considered in my medical decision making (see chart for details).     Patient presents with vomiting since 3 AM.  Nontender abdomen.  White count normal.  Lipase lipase 393, AST 99, alkaline phosphatase 165, total bili 1.6.  CT of abdomen/pelvis followed by ultrasound obtained.  Patient has gallbladder sludge but no obvious cholecystitis.  Initially, cirrhosis and portal hypertension were noted.  She has been in the ED for several hours with no acute abdomen and no vomiting.  Discharge medications Zofran 4 mg.   She will attempt to see her gastroenterologist tomorrow.  She agrees with this plan.  Final Clinical Impressions(s) / ED Diagnoses   Final diagnoses:  Intractable vomiting, presence of nausea not specified, unspecified vomiting type  Elevated lipase    ED Discharge Orders        Ordered    ondansetron (ZOFRAN ODT) 4 MG disintegrating tablet  Every 8 hours PRN     01/11/18 1505       Nat Christen, MD 01/11/18 1514

## 2018-01-11 NOTE — ED Notes (Signed)
Pt returned from US

## 2018-01-12 ENCOUNTER — Encounter (INDEPENDENT_AMBULATORY_CARE_PROVIDER_SITE_OTHER): Payer: Self-pay | Admitting: Internal Medicine

## 2018-01-12 ENCOUNTER — Ambulatory Visit (INDEPENDENT_AMBULATORY_CARE_PROVIDER_SITE_OTHER): Payer: Medicare Other | Admitting: Internal Medicine

## 2018-01-12 VITALS — BP 150/60 | HR 68 | Temp 98.2°F | Ht 63.0 in | Wt 129.8 lb

## 2018-01-12 DIAGNOSIS — R112 Nausea with vomiting, unspecified: Secondary | ICD-10-CM | POA: Diagnosis not present

## 2018-01-12 DIAGNOSIS — K7469 Other cirrhosis of liver: Secondary | ICD-10-CM

## 2018-01-12 NOTE — Progress Notes (Signed)
Subjective:    Patient ID: [name], female    DOB: [birth year], 77 y.o.   MRN: [id number]  HPI Here today for f/u. Hx of NAFLD. Recently seen in the ED with nausea and vomiting.  US abdomen yesterday revealed GB sludge, No evidence of acute cholecystitis.  Top normal CBD diameter where visualized.  CT abdomen/pelvis revealed IMPRESSION: 1. Cholelithiasis. Mild gallbladder wall thickening may be related to underlying liver disease. If there is clinical concern for acute cholecystitis, consider further evaluation with right upper quadrant ultrasound. 2. Cirrhosis with sequelae of portal hypertension including esophageal varices and large splenorenal portosystemic shunt. 3. New insufficiency fracture of the right sacral ala. Unchanged insufficiency fracture of the left sacral ala. 4. Healing left superior and inferior pubic rami fractures. 5.  Aortic atherosclerosis (ICD10-I70.0).  She says she became sick Sunday night and began to have nausea and vomiting. She says she never hurt. She has chronic nausea. She was given fluid in the ED. She feels better today. She actually felt better in the ED after receiving Zofran. No abdominal pain.  Hx of CVA last year. No nausea since Monday.  Presently taking Cipro from [facility] for a UTI.  In ED. Her lipase elevated 393    Last EGD with banding in October of 2015.  Impression: Two columns of esophageal varices were banded. Portal gastropathy. 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   Lipase     Component Value Date/Time   LIPASE 393 (H) 01/11/2018 0830      CMP Latest Ref Rng & Units 01/11/2018 09/10/2017 09/04/2017  Glucose 65 - 99 mg/dL 100(H) 88 101(H)  BUN 6 - 20 mg/dL 9 17 16   Creatinine 0.44 - 1.00 mg/dL 0.73 0.79 0.72  Sodium 135 - 145 mmol/L 140 142 145  Potassium 3.5 - 5.1 mmol/L 3.2(L) 3.8 5.2  Chloride 101 - 111 mmol/L 104 107 110  CO2 22 - 32 mmol/L 28 25 27   Calcium 8.9 - 10.3 mg/dL 9.2 8.7(L) 9.0   Total Protein 6.5 - 8.1 g/dL 6.5 - 5.6(L)  Total Bilirubin 0.3 - 1.2 mg/dL 1.6(H) - 0.7  Alkaline Phos 38 - 126 U/L 165(H) - -  AST 15 - 41 U/L 99(H) - 36(H)  ALT 14 - 54 U/L 52 - 39(H)   CBC    Component Value Date/Time   WBC 7.0 01/11/2018 0830   RBC 4.15 01/11/2018 0830   HGB 12.7 01/11/2018 0830   HCT 38.5 01/11/2018 0830   PLT 124 (L) 01/11/2018 0830   MCV 92.8 01/11/2018 0830   MCH 30.6 01/11/2018 0830   MCHC 33.0 01/11/2018 0830   RDW 13.1 01/11/2018 0830   LYMPHSABS 0.8 09/10/2017 1838   MONOABS 0.7 09/10/2017 1838   EOSABS 0.1 09/10/2017 1838   BASOSABS 0.0 09/10/2017 1838     Review of Systems Past Medical History:  Diagnosis Date  . Anemia   . Aortic insufficiency    Moderate  . Back pain   . Carotid stenosis, bilateral   . Cirrhosis ([place])   . Closed left femoral fracture ([place]) 04/21/2017  . Coronary artery disease    Stent x 2 RCA 1995, cardiac catheterization 09/2016 showing only mild atherosclerosis  . DDD (degenerative disc disease), lumbar   . Essential hypertension   . Falls   . GERD (gastroesophageal reflux disease)   . Hiatal hernia   . History of stroke   . Hypercholesteremia   . IBS (irritable bowel syndrome)   . Non-alcoholic  fatty liver disease   . OSA (obstructive sleep apnea)    no CPAP  . Pericardial effusion    a. small by echo 2018.  Marland Kitchen PSVT (paroxysmal supraventricular tachycardia) ([place])   . Recurrent UTI   . Seizures ([place])   . Stroke ([place])   . Type 2 diabetes mellitus ([place])    off medication  . Varices, esophageal ([place])     Past Surgical History:  Procedure Laterality Date  . [place]  . APPENDECTOMY    . BACK SURGERY  1985  . CARDIAC CATHETERIZATION  [phone]   Stent to the proximal RCA after MI   . CARDIAC CATHETERIZATION N/A 09/26/2016   Procedure: Left Heart Cath and Coronary Angiography;  Surgeon: Leonie Man, MD;  Location: [facility];  Service: Cardiovascular;  Laterality: N/A;    . CATARACT EXTRACTION W/PHACO Right 07/04/2014   Procedure: CATARACT EXTRACTION PHACO AND INTRAOCULAR LENS PLACEMENT (IOC);  Surgeon: Elta Guadeloupe T. Gershon Crane, MD;  Location: [facility];  Service: Ophthalmology;  Laterality: Right;  CDE:13.13  . COLONOSCOPY    . DILATION AND CURETTAGE OF UTERUS     x2  . Epi Retinal Membrane Peel Left   . ERCP    . ESOPHAGEAL BANDING N/A 04/01/2013   Procedure: ESOPHAGEAL BANDING;  Surgeon: Rogene Houston, MD;  Location[facility];  Service: Endoscopy;  Laterality: N/A;  . ESOPHAGEAL BANDING N/A 05/24/2013   Procedure: ESOPHAGEAL BANDING;  Surgeon: Rogene Houston, MD;  [facility];  Service: Endoscopy;  Laterality: N/A;  . ESOPHAGEAL BANDING N/A 06/21/2014   Procedure: ESOPHAGEAL BANDING;  Surgeon: Rogene Houston, MD;  [facility];  Service: Endoscopy;  Laterality: N/A;  . ESOPHAGOGASTRODUODENOSCOPY N/A 04/01/2013   Procedure: ESOPHAGOGASTRODUODENOSCOPY (EGD);  Surgeon: Rogene Houston, MD;  [facility];  Service: Endoscopy;  Laterality: N/A;  230-rescheduled to 8:30am Ann notified pt  . ESOPHAGOGASTRODUODENOSCOPY N/A 05/24/2013   Procedure: ESOPHAGOGASTRODUODENOSCOPY (EGD);  Surgeon: Rogene Houston, MD;  [facility];  Service: Endoscopy;  Laterality: N/A;  730  . ESOPHAGOGASTRODUODENOSCOPY N/A 06/21/2014   Procedure: ESOPHAGOGASTRODUODENOSCOPY (EGD);  Surgeon: Rogene Houston, MD;  [facility];  Service: Endoscopy;  Laterality: N/A;  930-rescheduled 10/14 @ 1200 Ann to notify pt  . EYE SURGERY  08   cataract surgery of the left eye  . FEMUR IM NAIL Left 03/02/2017   Procedure: INTRAMEDULLARY (IM) NAIL FEMORAL;  Surgeon: Rod Can, MD;  Location: [facility];  Service: Orthopedics;  Laterality: Left;  . HARDWARE REMOVAL Right 01/17/2013   Procedure: REMOVAL OF HARDWARE AND EXCISION ULNAR STYLOID RIGHT WRIST;  Surgeon: Tennis Must, MD;  Location: [facility];  Service: Orthopedics;  Laterality:  Right;  . MYRINGOTOMY  2012   both ears  . ORIF FEMUR FRACTURE Left 04/23/2017   Procedure: OPEN REDUCTION INTERNAL FIXATION (ORIF) DISTAL FEMUR FRACTURE (FRACTURE AROUND FEMORAL NAIL);  Surgeon: Altamese Welton, MD;  Location: [facility];  Service: Orthopedics;  Laterality: Left;  . TONSILLECTOMY    . VAGINAL HYSTERECTOMY  1972  . WRIST SURGERY     rt wrist hardwear removal    Allergies  Allergen Reactions  . Tape Rash and Other (See Comments)    Please use paper tape    Current Outpatient Medications on File Prior to Visit  Medication Sig Dispense Refill  . aspirin EC 81 MG tablet Take 81 mg by mouth daily.    Marland Kitchen  atorvastatin (LIPITOR) 40 MG tablet Take 40 mg by mouth daily.    . carvedilol (COREG) 12.5 MG tablet Take 1 tablet (12.5 mg total) by mouth 2 (two) times daily with a meal. 60 tablet 1  . ciprofloxacin (CIPRO) 500 MG tablet Take 1 tablet by mouth 2 (two) times daily.    Marland Kitchen diltiazem (CARDIZEM CD) 120 MG 24 hr capsule Take 120 mg by mouth daily.    . DiphenhydrAMINE HCl, Sleep, (SLEEP AID) 25 MG CAPS Take 1-2 capsules by mouth at bedtime as needed (for sleep).    . furosemide (LASIX) 20 MG tablet Take 20 mg by mouth daily.    . isosorbide mononitrate (IMDUR) 30 MG 24 hr tablet Take 30 mg by mouth daily.     Marland Kitchen lisinopril (PRINIVIL,ZESTRIL) 20 MG tablet Take 20 mg by mouth daily.    . ondansetron (ZOFRAN ODT) 4 MG disintegrating tablet Take 1 tablet (4 mg total) by mouth every 8 (eight) hours as needed for nausea or vomiting. 12 tablet 1  . phenytoin (DILANTIN) 200 MG ER capsule Take 1 capsule (200 mg total) by mouth daily. 30 capsule 1   No current facility-administered medications on file prior to visit.         Objective:   Physical Exam Blood pressure (!) 150/60, pulse 68, temperature 98.2 F (36.8 C), height 5' 3"  (1.6 m), weight 129 lb 12.8 oz (58.9 kg). Alert and oriented. Skin warm and dry. Oral mucosa is moist.   . Sclera anicteric, conjunctivae is pink. Thyroid not  enlarged. No cervical lymphadenopathy. Lungs clear. Heart regular rate and rhythm.  Abdomen is soft. Bowel sounds are positive. No hepatomegaly. No abdominal masses felt. No tenderness.  No edema to lower extremities.            Assessment & Plan:  Chronic nausea and vomiting. Continue to Zofran. Her liver numbers are slightly elevated. Will repeat today. Will repeat LIpase.

## 2018-01-12 NOTE — Patient Instructions (Signed)
Continue the Zofran as needed. CMET today.

## 2018-01-13 LAB — COMPLETE METABOLIC PANEL WITH GFR
AG Ratio: 1.5 (calc) (ref 1.0–2.5)
ALBUMIN MSPROF: 3.7 g/dL (ref 3.6–5.1)
ALKALINE PHOSPHATASE (APISO): 152 U/L — AB (ref 33–130)
ALT: 31 U/L — ABNORMAL HIGH (ref 6–29)
AST: 41 U/L — AB (ref 10–35)
BUN: 13 mg/dL (ref 7–25)
CALCIUM: 8.9 mg/dL (ref 8.6–10.4)
CHLORIDE: 105 mmol/L (ref 98–110)
CO2: 30 mmol/L (ref 20–32)
Creat: 0.78 mg/dL (ref 0.60–0.93)
GFR, EST NON AFRICAN AMERICAN: 74 mL/min/{1.73_m2} (ref >=60)
GFR, Est African American: 86 mL/min/{1.73_m2} (ref >=60)
GLOBULIN: 2.4 g/dL (ref 1.9–3.7)
Glucose, Bld: 72 mg/dL (ref 65–139)
POTASSIUM: 3.3 mmol/L — AB (ref 3.5–5.3)
SODIUM: 143 mmol/L (ref 135–146)
Total Bilirubin: 0.8 mg/dL (ref 0.2–1.2)
Total Protein: 6.1 g/dL (ref 6.1–8.1)

## 2018-01-13 LAB — LIPASE: LIPASE: 34 U/L (ref 7–60)

## 2018-01-14 ENCOUNTER — Other Ambulatory Visit (INDEPENDENT_AMBULATORY_CARE_PROVIDER_SITE_OTHER): Payer: Self-pay | Admitting: *Deleted

## 2018-01-14 DIAGNOSIS — K7460 Unspecified cirrhosis of liver: Secondary | ICD-10-CM

## 2018-01-26 ENCOUNTER — Telehealth (INDEPENDENT_AMBULATORY_CARE_PROVIDER_SITE_OTHER): Payer: Self-pay | Admitting: Internal Medicine

## 2018-01-26 DIAGNOSIS — R110 Nausea: Secondary | ICD-10-CM

## 2018-01-26 MED ORDER — ONDANSETRON HCL 4 MG PO TABS: 4.0000 mg | ORAL_TABLET | Freq: Three times a day (TID) | ORAL | 1 refills | Status: DC | PRN

## 2018-01-26 NOTE — Telephone Encounter (Signed)
Zofran sent to her pharmacy

## 2018-01-28 ENCOUNTER — Ambulatory Visit: Payer: Self-pay | Admitting: Orthopaedic Surgery

## 2018-02-02 DIAGNOSIS — E1140 Type 2 diabetes mellitus with diabetic neuropathy, unspecified: Secondary | ICD-10-CM | POA: Diagnosis not present

## 2018-02-02 DIAGNOSIS — R39198 Other difficulties with micturition: Secondary | ICD-10-CM | POA: Diagnosis not present

## 2018-02-02 DIAGNOSIS — I10 Essential (primary) hypertension: Secondary | ICD-10-CM | POA: Diagnosis not present

## 2018-02-02 DIAGNOSIS — Z6823 Body mass index (BMI) 23.0-23.9, adult: Secondary | ICD-10-CM | POA: Diagnosis not present

## 2018-02-02 DIAGNOSIS — R600 Localized edema: Secondary | ICD-10-CM | POA: Diagnosis not present

## 2018-02-02 DIAGNOSIS — Z1389 Encounter for screening for other disorder: Secondary | ICD-10-CM | POA: Diagnosis not present

## 2018-02-05 ENCOUNTER — Other Ambulatory Visit: Payer: Self-pay

## 2018-02-05 NOTE — Patient Outreach (Signed)
[name]) Care Management  [place]  02/05/2018   [name] 25-Jun-1941 [id number]   Subjective: Successful outreach to the patient for monthly assessment.  HIPAA verified.  The patient is doing well.  She states that she is having some swelling and pain that she rates a 6/10 in her left leg.  She states that she has notified her physician.  She states that she has her son in the home to help her.  I advised her to stay off the leg when she can and elevate it.  She verbalized understanding.  She is adherent with her medications and diet.  She denies any shortness of breath.  She states that she has decreased her smoking to 4 cigarettes a day if that much.   She states that she has another UTI that she takes medications for.  She has an appointment with her neurologist on Monday June 3 rd.   Encounter Medications:  Outpatient Encounter Medications as of 02/05/2018  Medication Sig Note  . aspirin EC 81 MG tablet Take 81 mg by mouth daily.   Marland Kitchen atorvastatin (LIPITOR) 40 MG tablet Take 40 mg by mouth daily.   . carvedilol (COREG) 12.5 MG tablet Take 1 tablet (12.5 mg total) by mouth 2 (two) times daily with a meal.   . ciprofloxacin (CIPRO) 500 MG tablet Take 1 tablet by mouth 2 (two) times daily.   Marland Kitchen diltiazem (CARDIZEM CD) 120 MG 24 hr capsule Take 120 mg by mouth daily.   . furosemide (LASIX) 20 MG tablet Take 20 mg by mouth daily.   . isosorbide mononitrate (IMDUR) 30 MG 24 hr tablet Take 30 mg by mouth daily.    Marland Kitchen lisinopril (PRINIVIL,ZESTRIL) 20 MG tablet Take 20 mg by mouth daily.   . meloxicam (MOBIC) 7.5 MG tablet Take 7.5 mg by mouth daily.   . ondansetron (ZOFRAN ODT) 4 MG disintegrating tablet Take 1 tablet (4 mg total) by mouth every 8 (eight) hours as needed for nausea or vomiting.   . ondansetron (ZOFRAN) 4 MG tablet Take 1 tablet (4 mg total) by mouth every 8 (eight) hours as needed for nausea or vomiting.   . phenazopyridine (PYRIDIUM) 95 MG tablet  Take 95 mg by mouth 3 (three) times daily as needed for pain.   . phenytoin (DILANTIN) 200 MG ER capsule Take 1 capsule (200 mg total) by mouth daily. 01/11/2018: Patient thinks the MG has decreased but pharmacy does not have a record of that  . traZODone (DESYREL) 50 MG tablet Take 50 mg by mouth at bedtime.   . DiphenhydrAMINE HCl, Sleep, (SLEEP AID) 25 MG CAPS Take 1-2 capsules by mouth at bedtime as needed (for sleep).    No facility-administered encounter medications on file as of 02/05/2018.     Functional Status:  In your present state of health, do you have any difficulty performing the following activities: 12/28/2017 09/15/2017  Hearing? N N  Vision? N Y  Difficulty concentrating or making decisions? N N  Walking or climbing stairs? Y Y  Dressing or bathing? N Y  Doing errands, shopping? [name]  Preparing Food and eating ? Y Y  Using the Toilet? N Y  In the past six months, have you accidently leaked urine? N N  Do you have problems with loss of bowel control? N N  Managing your Medications? N N  Managing your Finances? N N  Housekeeping or managing your Housekeeping? [name]  Some recent data might  be hidden    Fall/Depression Screening: Fall Risk  02/05/2018 12/28/2017 09/15/2017  Falls in the past year? No Yes Yes  Number falls in past yr: - 1 2 or more  Injury with Fall? - - Yes  Risk Factor Category  - - High Fall Risk  Risk for fall due to : - History of fall(s);Impaired balance/gait History of fall(s);Medication side effect;Impaired balance/gait;Impaired mobility;Impaired vision  Risk for fall due to: Comment - - -  Follow up - - Falls evaluation completed;Education provided;Follow up appointment;Falls prevention discussed   PHQ 2/9 Scores 12/28/2017 09/15/2017 07/06/2017 03/09/2017  PHQ - 2 Score 0 1 0 0    Assessment: Patient will continue to benefit from health coach outreach for disease management and support.  THN CM Care Plan Problem One     Most Recent Value  THN Long  Term Goal   In 90 days the patient will verbalize that she hasnot been to the emergency room.  THN Long Term Goal Start Date  02/05/18  Interventions for Problem One Long Term Goal  reinterated symptoms of heart failure, medication adherence and diet  THN CM Short Term Goal #1   In 30 days the patient will verbalize that she is weighing daily.  THN CM Short Term Goal #1 Start Date  02/05/18  Interventions for Short Term Goal #1  talked about the importance of daily weights  THN CM Short Term Goal #2   In 30 days the pateint will verbalize following a low salt diet  THN CM Short Term Goal #2 Start Date  02/05/18  [facility] CM Short Term Goal #2 Met Date  02/05/18  Interventions for Short Term Goal #2  patient stated that she is monitoring salt in her diet     Plan: [facility] will contact patient in the month of June and patient agrees to next outreach.  Lazaro Arms RN, BSN, [place] Direct Dial:  [phone]  Fax: [phone]

## 2018-02-08 DIAGNOSIS — I69344 Monoplegia of lower limb following cerebral infarction affecting left non-dominant side: Secondary | ICD-10-CM | POA: Diagnosis not present

## 2018-02-08 DIAGNOSIS — Z79899 Other long term (current) drug therapy: Secondary | ICD-10-CM | POA: Diagnosis not present

## 2018-02-08 DIAGNOSIS — E1140 Type 2 diabetes mellitus with diabetic neuropathy, unspecified: Secondary | ICD-10-CM | POA: Diagnosis not present

## 2018-02-09 DIAGNOSIS — Z7409 Other reduced mobility: Secondary | ICD-10-CM | POA: Diagnosis not present

## 2018-02-09 DIAGNOSIS — I69364 Other paralytic syndrome following cerebral infarction affecting left non-dominant side: Secondary | ICD-10-CM | POA: Diagnosis not present

## 2018-02-15 DIAGNOSIS — E1142 Type 2 diabetes mellitus with diabetic polyneuropathy: Secondary | ICD-10-CM | POA: Diagnosis not present

## 2018-02-15 DIAGNOSIS — L851 Acquired keratosis [keratoderma] palmaris et plantaris: Secondary | ICD-10-CM | POA: Diagnosis not present

## 2018-03-01 ENCOUNTER — Other Ambulatory Visit (INDEPENDENT_AMBULATORY_CARE_PROVIDER_SITE_OTHER): Payer: Self-pay | Admitting: *Deleted

## 2018-03-01 ENCOUNTER — Encounter (INDEPENDENT_AMBULATORY_CARE_PROVIDER_SITE_OTHER): Payer: Self-pay | Admitting: *Deleted

## 2018-03-01 DIAGNOSIS — K7460 Unspecified cirrhosis of liver: Secondary | ICD-10-CM

## 2018-03-02 ENCOUNTER — Other Ambulatory Visit: Payer: Self-pay

## 2018-03-02 NOTE — Patient Outreach (Signed)
[facility]) Care Management  [place]  03/02/2018   [name] May 28, 1941 [id number]  Subjective: Received call from the patient for assessment.  HIPAA verified.  The patient states that she has been having problems with her left leg.  It has been hard to walk on it.  She is not having swelling and the pain is intermittent. I advised the patient to notify her physician.  The patient verbalized understanding.  She denies any pain or falls. She states that she is very cognizant of salt in her diet.  She denies any shortness of breath, swelling,or wheezing.  The patient states that she is adherent with all of her medications except for her Lasix.  She said that it causes her legs to cramp.  I explain to the patient that this is an important medication for her to take.  If she is cramping she should notify her physician. There are modifications that can be made to help her with her symptoms.  She verbalized understanding.  The patient states that she is down to smoking two cigarettes a day.  I congratulated her on her decrease and encouraged her to continue with her smoking cessation.   Encounter Medications:  Outpatient Encounter Medications as of 03/02/2018  Medication Sig Note  . aspirin EC 81 MG tablet Take 81 mg by mouth daily.   Marland Kitchen atorvastatin (LIPITOR) 40 MG tablet Take 40 mg by mouth daily.   . carvedilol (COREG) 12.5 MG tablet Take 1 tablet (12.5 mg total) by mouth 2 (two) times daily with a meal.   . diltiazem (CARDIZEM CD) 120 MG 24 hr capsule Take 120 mg by mouth daily.   . isosorbide mononitrate (IMDUR) 30 MG 24 hr tablet Take 30 mg by mouth daily.    Marland Kitchen lisinopril (PRINIVIL,ZESTRIL) 20 MG tablet Take 20 mg by mouth daily.   . ondansetron (ZOFRAN) 4 MG tablet Take 1 tablet (4 mg total) by mouth every 8 (eight) hours as needed for nausea or vomiting.   . traZODone (DESYREL) 50 MG tablet Take 50 mg by mouth at bedtime.   . ciprofloxacin (CIPRO) 500 MG tablet  Take 1 tablet by mouth 2 (two) times daily.   . DiphenhydrAMINE HCl, Sleep, (SLEEP AID) 25 MG CAPS Take 1-2 capsules by mouth at bedtime as needed (for sleep).   . furosemide (LASIX) 20 MG tablet Take 20 mg by mouth daily.   . meloxicam (MOBIC) 7.5 MG tablet Take 7.5 mg by mouth daily.   . ondansetron (ZOFRAN ODT) 4 MG disintegrating tablet Take 1 tablet (4 mg total) by mouth every 8 (eight) hours as needed for nausea or vomiting. (Patient not taking: Reported on 03/02/2018)   . phenazopyridine (PYRIDIUM) 95 MG tablet Take 95 mg by mouth 3 (three) times daily as needed for pain.   . phenytoin (DILANTIN) 200 MG ER capsule Take 1 capsule (200 mg total) by mouth daily. (Patient not taking: Reported on 03/02/2018) 01/11/2018: Patient thinks the MG has decreased but pharmacy does not have a record of that   No facility-administered encounter medications on file as of 03/02/2018.     Functional Status:  In your present state of health, do you have any difficulty performing the following activities: 12/28/2017 09/15/2017  Hearing? N N  Vision? N Y  Difficulty concentrating or making decisions? N N  Walking or climbing stairs? Y Y  Dressing or bathing? N Y  Doing errands, shopping? [name]  Preparing Food and eating ? Darreld Mclean  Y  Using the Toilet? N Y  In the past six months, have you accidently leaked urine? N N  Do you have problems with loss of bowel control? N N  Managing your Medications? N N  Managing your Finances? N N  Housekeeping or managing your Housekeeping? Y Y  Some recent data might be hidden    Fall/Depression Screening: Fall Risk  03/02/2018 02/05/2018 12/28/2017  Falls in the past year? No No Yes  Number falls in past yr: - - 1  Injury with Fall? - - -  Risk Factor Category  - - -  Risk for fall due to : - - History of fall(s);Impaired balance/gait  Risk for fall due to: Comment - - -  Follow up - - -   PHQ 2/9 Scores 12/28/2017 09/15/2017 07/06/2017 03/09/2017  PHQ - 2 Score 0 1 0 0     Assessment: Patient will continue to benefit from health coach outreach for disease management and support.  THN CM Care Plan Problem One     Most Recent Value  THN Long Term Goal   In 90 days the patient will verbalize that she has not been seen in the emergency room.  THN Long Term Goal Start Date  03/02/18  Interventions for Problem One Long Term Goal  discussed heart failure symptoms, medication and diet  THN CM Short Term Goal #1   In 30 days the patient will verbalize that she is weighing daily.  THN CM Short Term Goal #1 Start Date  03/02/18  Interventions for Short Term Goal #1  discussed the reasons for weighing daily  THN CM Short Term Goal #3  In 30 days the patient will express that she hs called her physician and discusses her condition  Interventions for Short Tern Goal #3  Talked with the patient about informing the doctor of symptoms with her medication and left leg.     Plan: RN Health Coach will contact patient in the month of July and patient agrees to next outreach.  Lazaro Arms RN, BSN, [place] Direct Dial:  [phone]  Fax: [phone]

## 2018-03-02 NOTE — Patient Outreach (Signed)
[facility]) Care Management  03/02/2018  Diamante Truszkowski [birth year] [id number]   1st outreach attempt to the patient for assessment.  Family member stated that the patient was still asleep.  HIPAA compliant message left with contact information.  Plan: RN Health Coach will send letter. RN Health Coach will make another attempt to the patient within four business days.  Lazaro Arms RN, BSN, [place] Direct Dial:  [phone]  Fax: [phone]

## 2018-03-05 ENCOUNTER — Telehealth (INDEPENDENT_AMBULATORY_CARE_PROVIDER_SITE_OTHER): Payer: Self-pay | Admitting: Internal Medicine

## 2018-03-05 ENCOUNTER — Other Ambulatory Visit (INDEPENDENT_AMBULATORY_CARE_PROVIDER_SITE_OTHER): Payer: Self-pay | Admitting: Internal Medicine

## 2018-03-05 DIAGNOSIS — K219 Gastro-esophageal reflux disease without esophagitis: Secondary | ICD-10-CM

## 2018-03-05 MED ORDER — OMEPRAZOLE 20 MG PO CPDR: 20.0000 mg | DELAYED_RELEASE_CAPSULE | Freq: Every day | ORAL | 3 refills | Status: DC

## 2018-03-05 NOTE — Telephone Encounter (Signed)
Patient left message wanting to know if the pharmacy had called about her omeprazole - please call her at [phone]

## 2018-03-05 NOTE — Telephone Encounter (Signed)
Rx sent to [facility] for Omeprazole

## 2018-03-11 DIAGNOSIS — Z7409 Other reduced mobility: Secondary | ICD-10-CM | POA: Diagnosis not present

## 2018-03-11 DIAGNOSIS — I69364 Other paralytic syndrome following cerebral infarction affecting left non-dominant side: Secondary | ICD-10-CM | POA: Diagnosis not present

## 2018-03-17 ENCOUNTER — Telehealth (INDEPENDENT_AMBULATORY_CARE_PROVIDER_SITE_OTHER): Payer: Self-pay | Admitting: Internal Medicine

## 2018-03-17 IMAGING — CT CT HEAD W/O CM
4 series · 16 of 47 positions shown, 18 images · non-contrast
Comparison: CT scan of February 28, 2017.

CLINICAL DATA: Altered level of consciousness.

EXAM:
CT HEAD WITHOUT CONTRAST
TECHNIQUE: Contiguous axial images were obtained from the base of the skull
through the vertex without intravenous contrast.

[Series 2: head trauma wo · axial · 0.42mm/px · z∈[+26,+131]mm · 7 of 29 slices shown, 9 images]
[im 4/29  brain]
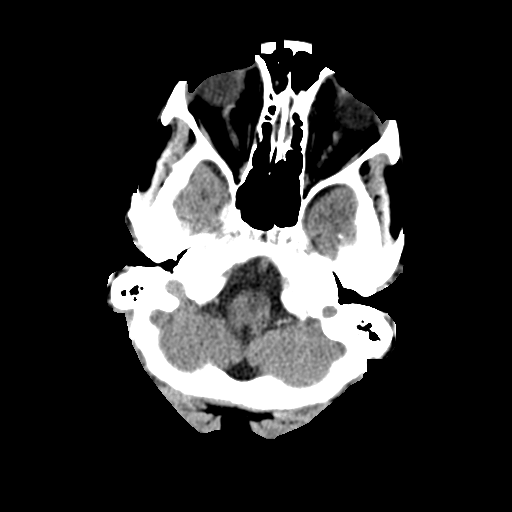
[im 4/29  bone]
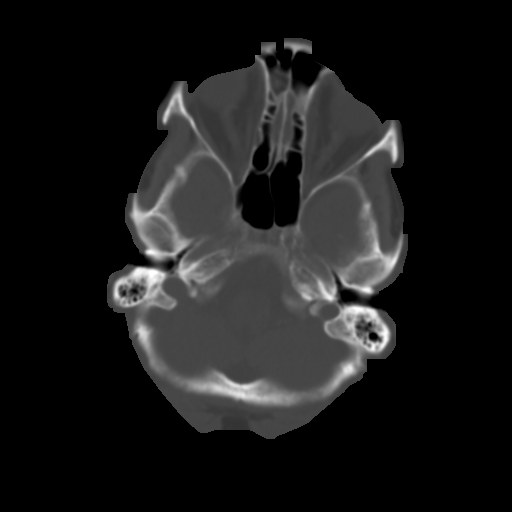
[im 8/29  brain]
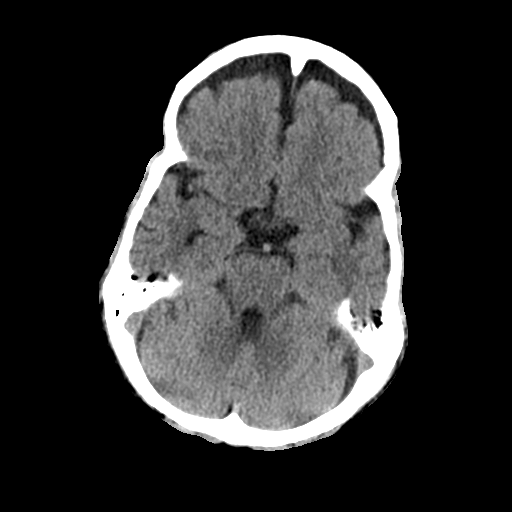
[im 11/29  brain]
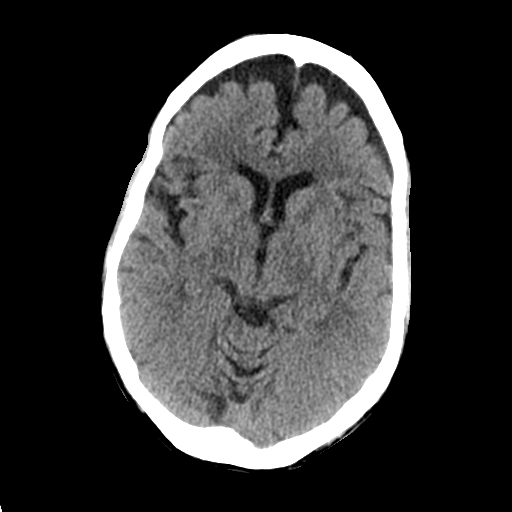
[im 15/29  brain]
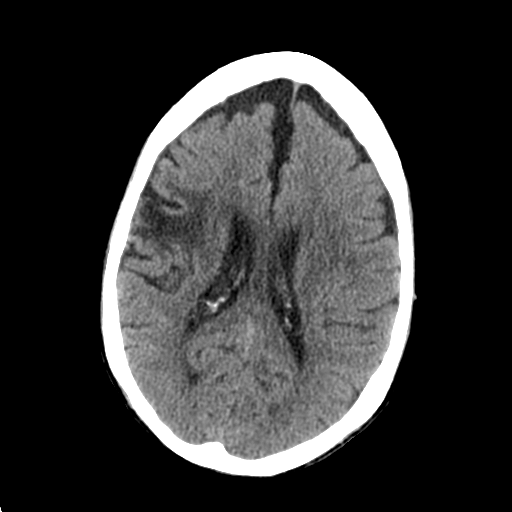
[im 18/29  brain]
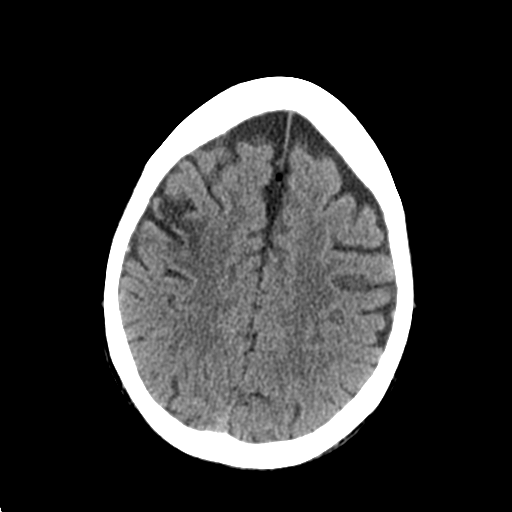
[im 18/29  bone]
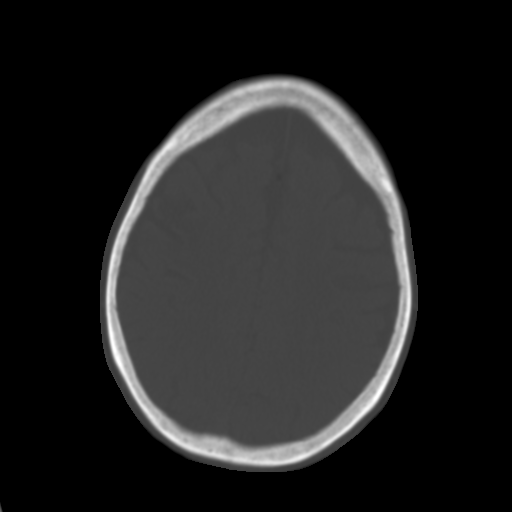
[im 22/29  brain]
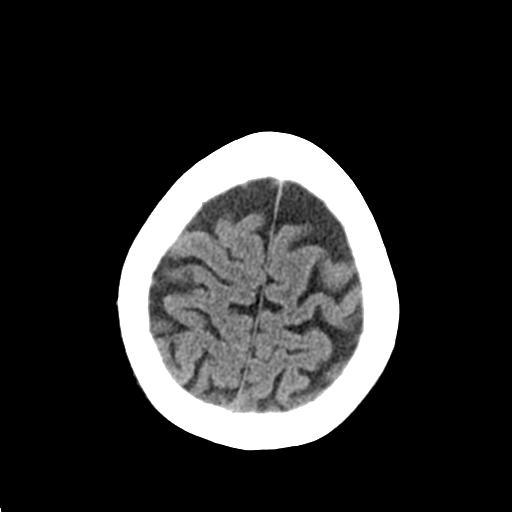
[im 25/29  brain]
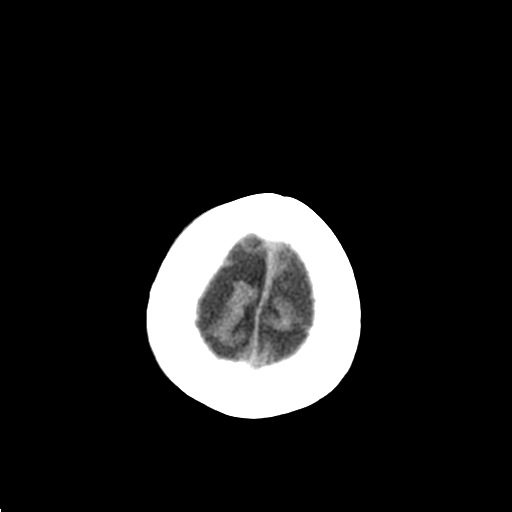

[Series 3: head bone · axial · 0.42mm/px · z∈[+25,+53]mm · 3 of 73 slices shown]
[im 8/73  bone]
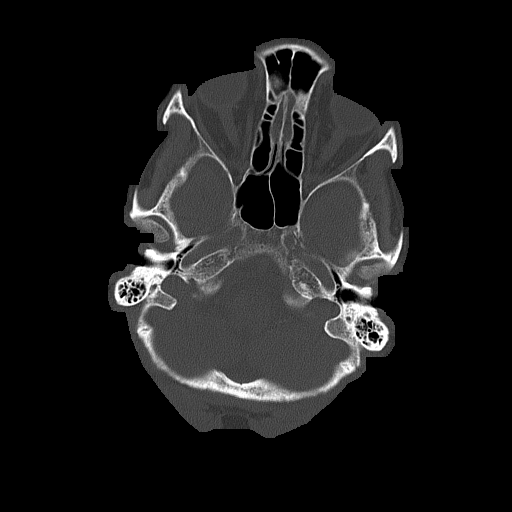
[im 15/73  bone]
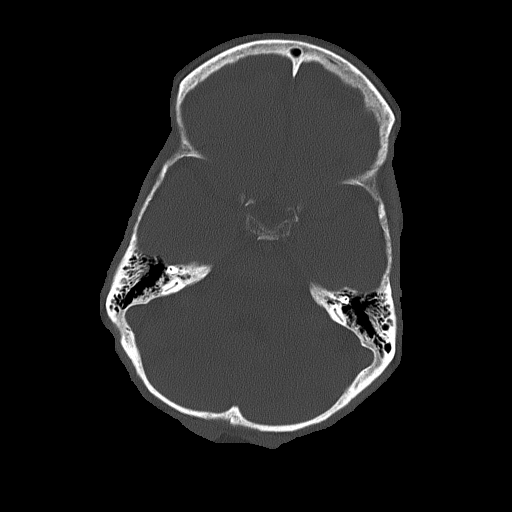
[im 22/73  bone]
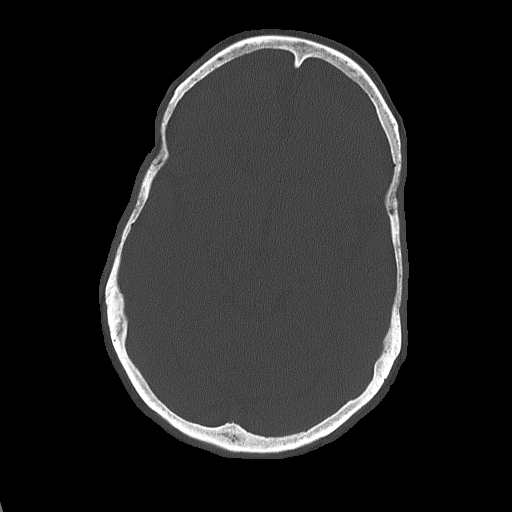

[Series 4: coronal soft tissue · coronal · 0.29mm/px · 3 of 67 slices shown]
[im 23/67  brain]
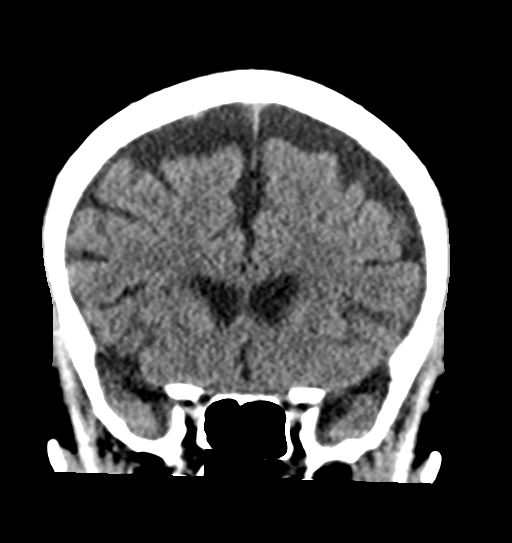
[im 30/67  brain]
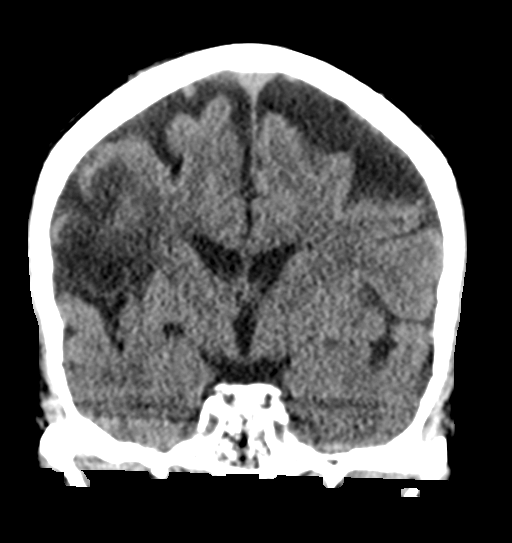
[im 37/67  brain]
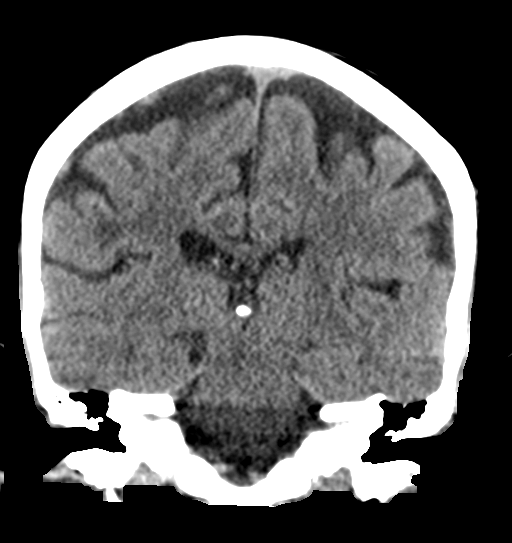

[Series 5: sagittal soft tissue · sagittal · 0.29mm/px · 3 of 49 slices shown]
[im 17/49  brain]
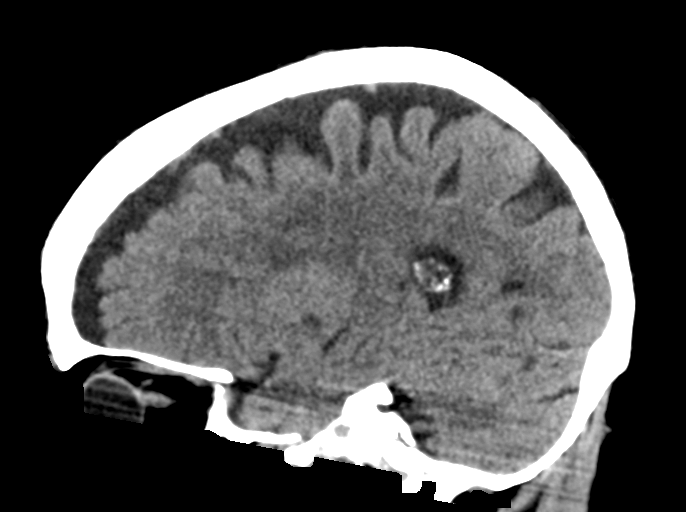
[im 25/49  brain]
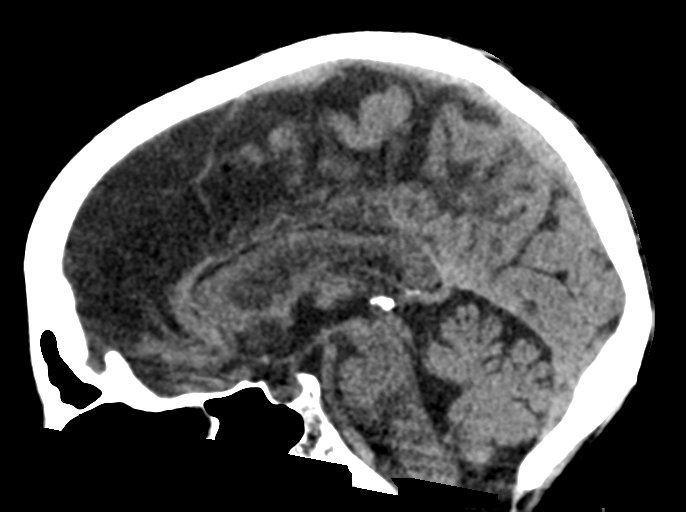
[im 33/49  brain]
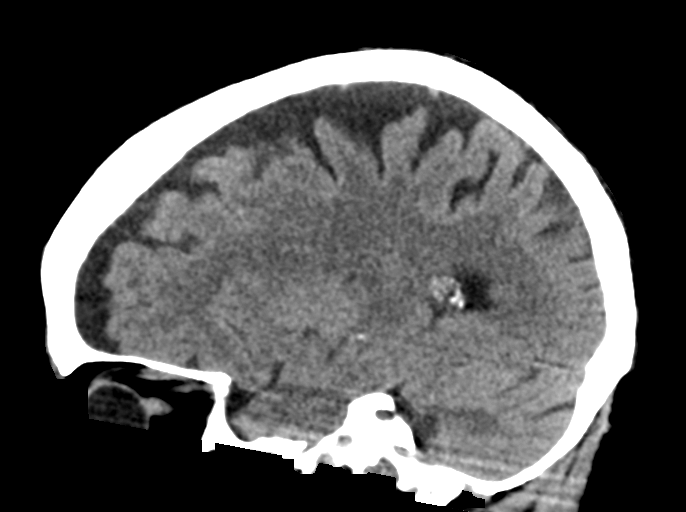

[16 of 47 positions shown; findings below may reference images not displayed]

FINDINGS: Brain: Mild diffuse cortical atrophy is noted. Old right frontal
infarction is noted. No mass effect or midline shift is noted.
Ventricular size is within normal limits. There is no evidence of
mass lesion, hemorrhage or acute infarction.

Vascular: No hyperdense vessel or unexpected calcification.

Skull: Normal. Negative for fracture or focal lesion.

Sinuses/Orbits: No acute finding.

Other: None.
IMPRESSION: Mild diffuse cortical atrophy. Old right frontal infarction. No
acute intracranial abnormality seen.

## 2018-03-17 NOTE — Telephone Encounter (Signed)
Patient left message wanting you to call her at [phone]

## 2018-03-17 NOTE — Telephone Encounter (Signed)
I have spoken with patient 

## 2018-03-24 ENCOUNTER — Other Ambulatory Visit (INDEPENDENT_AMBULATORY_CARE_PROVIDER_SITE_OTHER): Payer: Self-pay | Admitting: *Deleted

## 2018-03-24 DIAGNOSIS — I951 Orthostatic hypotension: Secondary | ICD-10-CM | POA: Diagnosis not present

## 2018-03-24 DIAGNOSIS — R748 Abnormal levels of other serum enzymes: Secondary | ICD-10-CM

## 2018-03-24 DIAGNOSIS — Z1389 Encounter for screening for other disorder: Secondary | ICD-10-CM | POA: Diagnosis not present

## 2018-03-24 DIAGNOSIS — Z6822 Body mass index (BMI) 22.0-22.9, adult: Secondary | ICD-10-CM | POA: Diagnosis not present

## 2018-03-24 DIAGNOSIS — Z0000 Encounter for general adult medical examination without abnormal findings: Secondary | ICD-10-CM | POA: Diagnosis not present

## 2018-03-24 DIAGNOSIS — Z0001 Encounter for general adult medical examination with abnormal findings: Secondary | ICD-10-CM | POA: Diagnosis not present

## 2018-03-24 DIAGNOSIS — M1991 Primary osteoarthritis, unspecified site: Secondary | ICD-10-CM | POA: Diagnosis not present

## 2018-03-24 DIAGNOSIS — K5900 Constipation, unspecified: Secondary | ICD-10-CM | POA: Diagnosis not present

## 2018-03-24 NOTE — Progress Notes (Signed)
Hepatic

## 2018-03-25 LAB — HEPATIC FUNCTION PANEL
ALBUMIN: 4.1 g/dL (ref 3.5–4.8)
ALT: 32 IU/L (ref 0–32)
AST: 40 IU/L (ref 0–40)
Alkaline Phosphatase: 120 IU/L — ABNORMAL HIGH (ref 39–117)
BILIRUBIN TOTAL: 0.5 mg/dL (ref 0.0–1.2)
BILIRUBIN, DIRECT: 0.17 mg/dL (ref 0.00–0.40)
TOTAL PROTEIN: 6.3 g/dL (ref 6.0–8.5)

## 2018-03-31 ENCOUNTER — Other Ambulatory Visit (HOSPITAL_COMMUNITY): Payer: Self-pay | Admitting: Physician Assistant

## 2018-03-31 DIAGNOSIS — Z1231 Encounter for screening mammogram for malignant neoplasm of breast: Secondary | ICD-10-CM

## 2018-04-01 ENCOUNTER — Other Ambulatory Visit: Payer: Self-pay

## 2018-04-01 DIAGNOSIS — Z7902 Long term (current) use of antithrombotics/antiplatelets: Secondary | ICD-10-CM | POA: Diagnosis not present

## 2018-04-01 DIAGNOSIS — M1991 Primary osteoarthritis, unspecified site: Secondary | ICD-10-CM | POA: Diagnosis not present

## 2018-04-01 DIAGNOSIS — Z794 Long term (current) use of insulin: Secondary | ICD-10-CM | POA: Diagnosis not present

## 2018-04-01 DIAGNOSIS — R600 Localized edema: Secondary | ICD-10-CM | POA: Diagnosis not present

## 2018-04-01 DIAGNOSIS — K5900 Constipation, unspecified: Secondary | ICD-10-CM | POA: Diagnosis not present

## 2018-04-01 DIAGNOSIS — G4709 Other insomnia: Secondary | ICD-10-CM | POA: Diagnosis not present

## 2018-04-01 DIAGNOSIS — I10 Essential (primary) hypertension: Secondary | ICD-10-CM | POA: Diagnosis not present

## 2018-04-01 DIAGNOSIS — Z7982 Long term (current) use of aspirin: Secondary | ICD-10-CM | POA: Diagnosis not present

## 2018-04-01 DIAGNOSIS — R627 Adult failure to thrive: Secondary | ICD-10-CM | POA: Diagnosis not present

## 2018-04-01 DIAGNOSIS — Z6823 Body mass index (BMI) 23.0-23.9, adult: Secondary | ICD-10-CM | POA: Diagnosis not present

## 2018-04-01 DIAGNOSIS — M5136 Other intervertebral disc degeneration, lumbar region: Secondary | ICD-10-CM | POA: Diagnosis not present

## 2018-04-01 DIAGNOSIS — Z87891 Personal history of nicotine dependence: Secondary | ICD-10-CM | POA: Diagnosis not present

## 2018-04-01 DIAGNOSIS — E1151 Type 2 diabetes mellitus with diabetic peripheral angiopathy without gangrene: Secondary | ICD-10-CM | POA: Diagnosis not present

## 2018-04-01 DIAGNOSIS — Z8673 Personal history of transient ischemic attack (TIA), and cerebral infarction without residual deficits: Secondary | ICD-10-CM | POA: Diagnosis not present

## 2018-04-01 DIAGNOSIS — G894 Chronic pain syndrome: Secondary | ICD-10-CM | POA: Diagnosis not present

## 2018-04-01 NOTE — Patient Outreach (Signed)
[facility]) Care Management  Fl[place]  04/01/2018   Darcus Edds [birth year] [id number]  Subjective:Telephone call to the patient for assessment. HIPAA verified.  The patient states that she has been doing well.  She has been having headaches that she states that only last for a short period.  She reports that she has spoken with her physician and they could not find anything wrong.  She denies any fall or swelling.  Her weight today was 134.  She states that she has been adherent with her medications. I advised the patient about being careful in the heat and she verbalized understanding.  We reviewed signs and symptoms of heart failure, discussed  CHF action plan, and encouraged patient to weigh daily.  She has an appointment with her physician in two months.  Encounter Medications:  Outpatient Encounter Medications as of 04/01/2018  Medication Sig Note  . aspirin EC 81 MG tablet Take 81 mg by mouth daily.   Marland Kitchen atorvastatin (LIPITOR) 40 MG tablet Take 40 mg by mouth daily.   . carvedilol (COREG) 12.5 MG tablet Take 1 tablet (12.5 mg total) by mouth 2 (two) times daily with a meal.   . diltiazem (CARDIZEM CD) 120 MG 24 hr capsule Take 120 mg by mouth daily.   . DiphenhydrAMINE HCl, Sleep, (SLEEP AID) 25 MG CAPS Take 1-2 capsules by mouth at bedtime as needed (for sleep).   . furosemide (LASIX) 20 MG tablet Take 20 mg by mouth daily.   . isosorbide mononitrate (IMDUR) 30 MG 24 hr tablet Take 30 mg by mouth daily.    Marland Kitchen lisinopril (PRINIVIL,ZESTRIL) 20 MG tablet Take 20 mg by mouth daily.   Marland Kitchen omeprazole (PRILOSEC) 20 MG capsule Take 1 capsule (20 mg total) by mouth daily.   . ondansetron (ZOFRAN) 4 MG tablet Take 1 tablet (4 mg total) by mouth every 8 (eight) hours as needed for nausea or vomiting.   . phenazopyridine (PYRIDIUM) 95 MG tablet Take 95 mg by mouth 3 (three) times daily as needed for pain.   . traZODone (DESYREL) 50 MG tablet Take 50 mg by mouth at bedtime.    . ciprofloxacin (CIPRO) 500 MG tablet Take 1 tablet by mouth 2 (two) times daily.   . meloxicam (MOBIC) 7.5 MG tablet Take 7.5 mg by mouth daily.   . ondansetron (ZOFRAN ODT) 4 MG disintegrating tablet Take 1 tablet (4 mg total) by mouth every 8 (eight) hours as needed for nausea or vomiting. (Patient not taking: Reported on 03/02/2018)   . phenytoin (DILANTIN) 200 MG ER capsule Take 1 capsule (200 mg total) by mouth daily. (Patient not taking: Reported on 03/02/2018) 01/11/2018: Patient thinks the MG has decreased but pharmacy does not have a record of that   No facility-administered encounter medications on file as of 04/01/2018.     Functional Status:  In your present state of health, do you have any difficulty performing the following activities: 12/28/2017 09/15/2017  Hearing? N N  Vision? N Y  Difficulty concentrating or making decisions? N N  Walking or climbing stairs? Y Y  Dressing or bathing? N Y  Doing errands, shopping? [name]  Preparing Food and eating ? Y Y  Using the Toilet? N Y  In the past six months, have you accidently leaked urine? N N  Do you have problems with loss of bowel control? N N  Managing your Medications? N N  Managing your Finances? N N  Housekeeping or managing your  Housekeeping? Y Y  Some recent data might be hidden    Fall/Depression Screening: Fall Risk  04/01/2018 03/02/2018 02/05/2018  Falls in the past year? No No No  Number falls in past yr: - - -  Injury with Fall? - - -  Risk Factor Category  - - -  Risk for fall due to : - - -  Risk for fall due to: Comment - - -  Follow up - - -   PHQ 2/9 Scores 12/28/2017 09/15/2017 07/06/2017 03/09/2017  PHQ - 2 Score 0 1 0 0    Assessment: Patient will continue to benefit from health coach outreach for disease management and support.  THN CM Care Plan Problem One     Most Recent Value  [facility]N Long Term Goal   In 90 days the patient will verbalize that she has not been seen in the emergency room.  [facility]N Long Term  Goal Start Date  04/01/18  [facility] CM Short Term Goal #1   In 30 days the patient will verbalize that she is weighing daily.  [facility]N CM Short Term Goal #1 Start Date  04/01/18  Interventions for Short Term Goal #1  discussed with th epatient why it is important to weigh daily  [facility] CM Short Term Goal #3  In 30 days the patient will express that she hs called her physician and discusses her condition  [facility]N CM Short Term Goal #3 Start Date  04/01/18  [facility] CM Short Term Goal #3 Met Date  04/01/18  Interventions for Short Tern Goal #3  pateint states that she has expressed her issues with her physician     Plan: [place] will contact patient in the month of August and patient agrees to next outreach.  Lazaro Arms RN, BSN, [place] Direct Dial:  [phone]  Fax: [phone]

## 2018-04-03 IMAGING — DX DG HIP (WITH OR WITHOUT PELVIS) 2-3V*L*
3 series · 3 of 3 positions shown · non-contrast
Comparison: 04/21/2017

CLINICAL DATA: Patient fell this morning, landing on the left side.
Now with left-sided hip pain in the coccyx area. Ambulating with
pain.

EXAM:
DG HIP (WITH OR WITHOUT PELVIS) 2-3V LEFT

[pelvis ap]
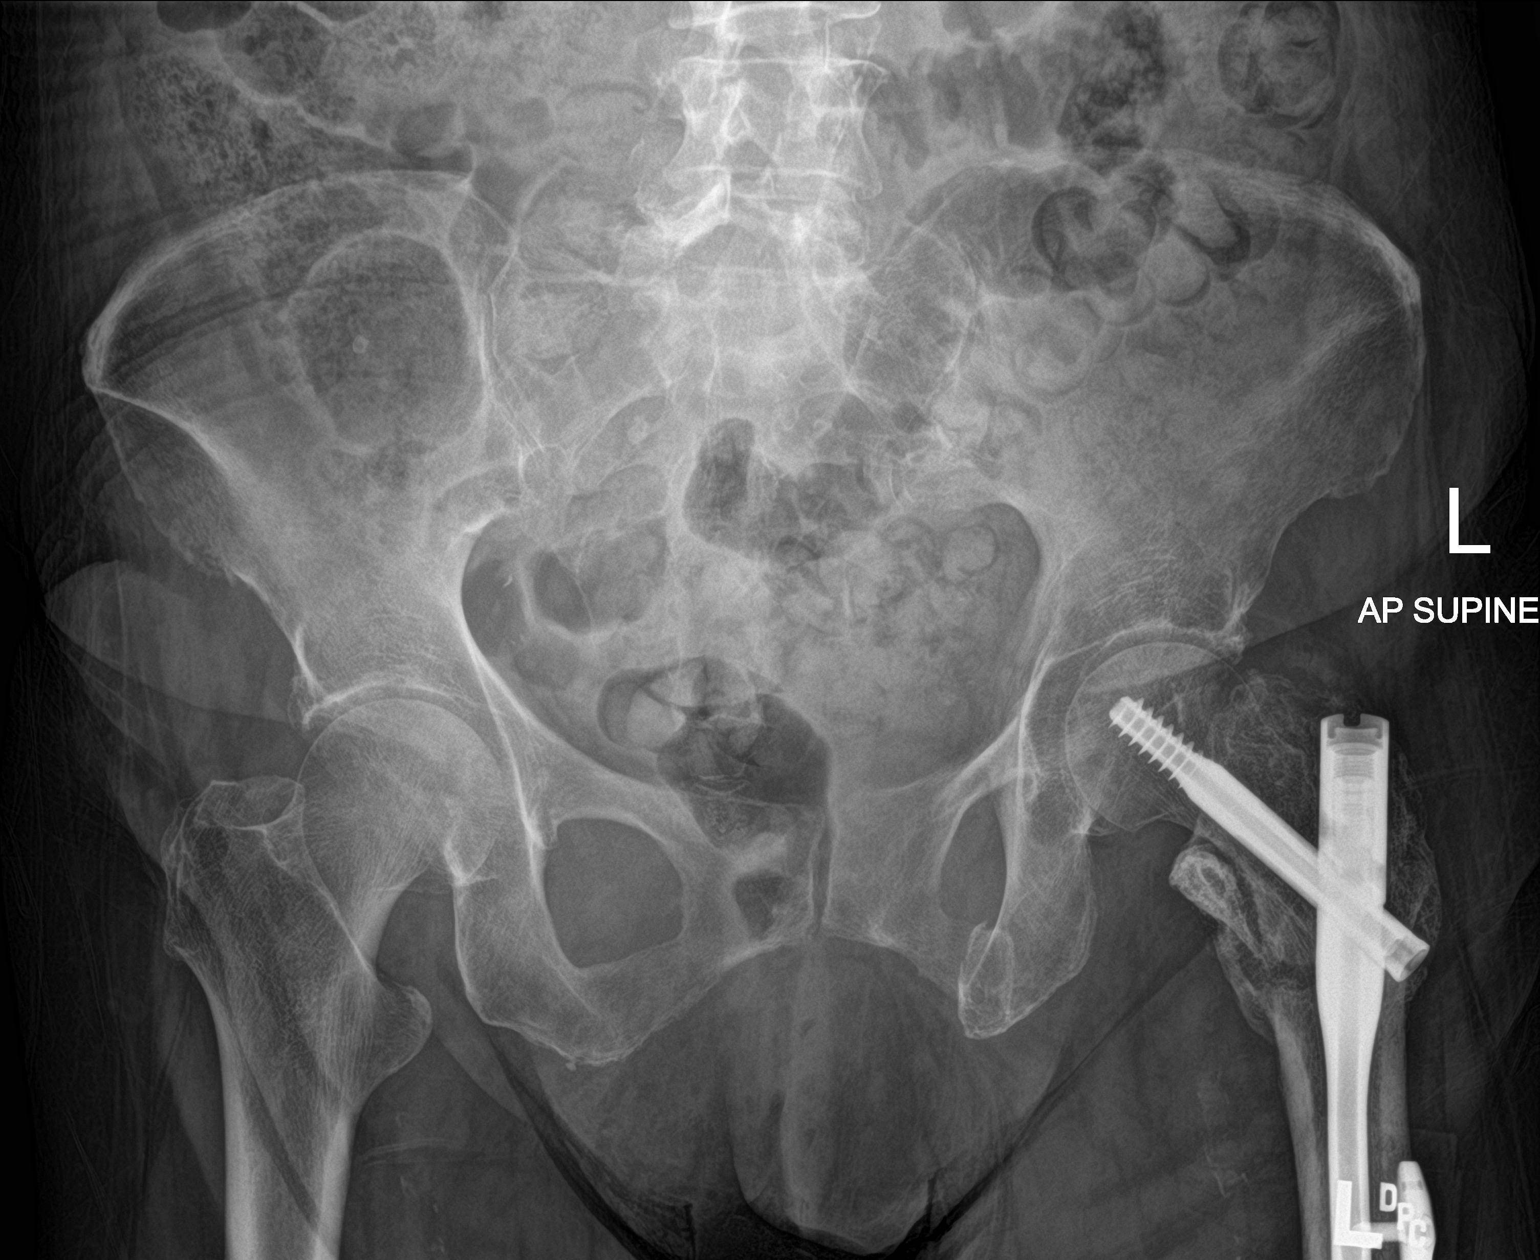

[hip ap]
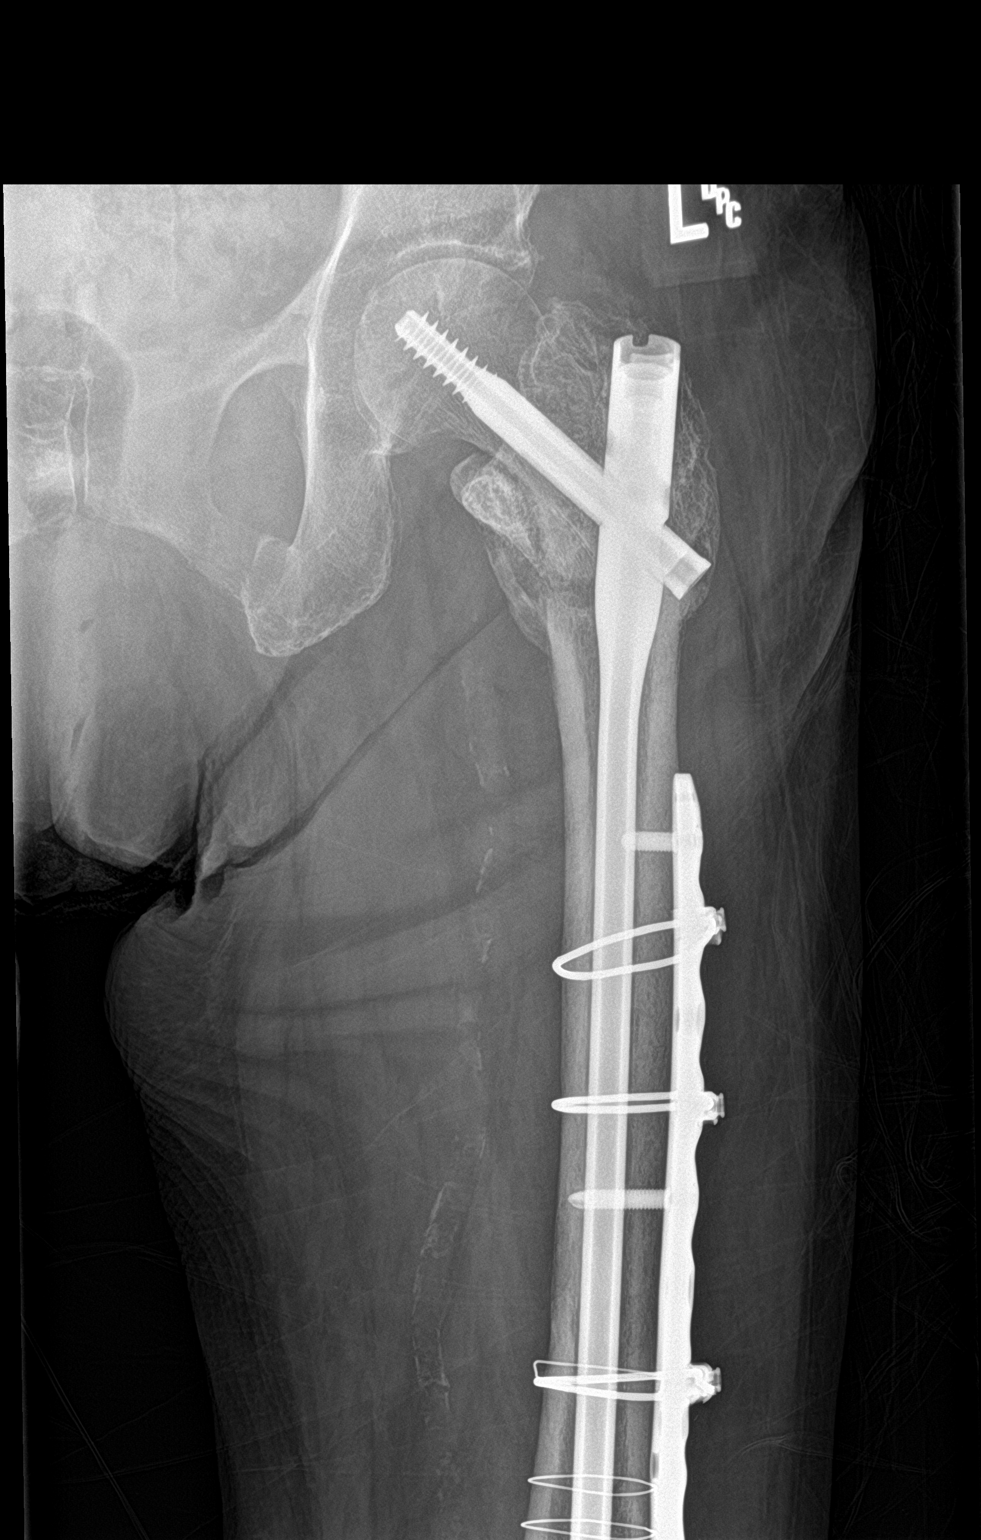

[hip lat]
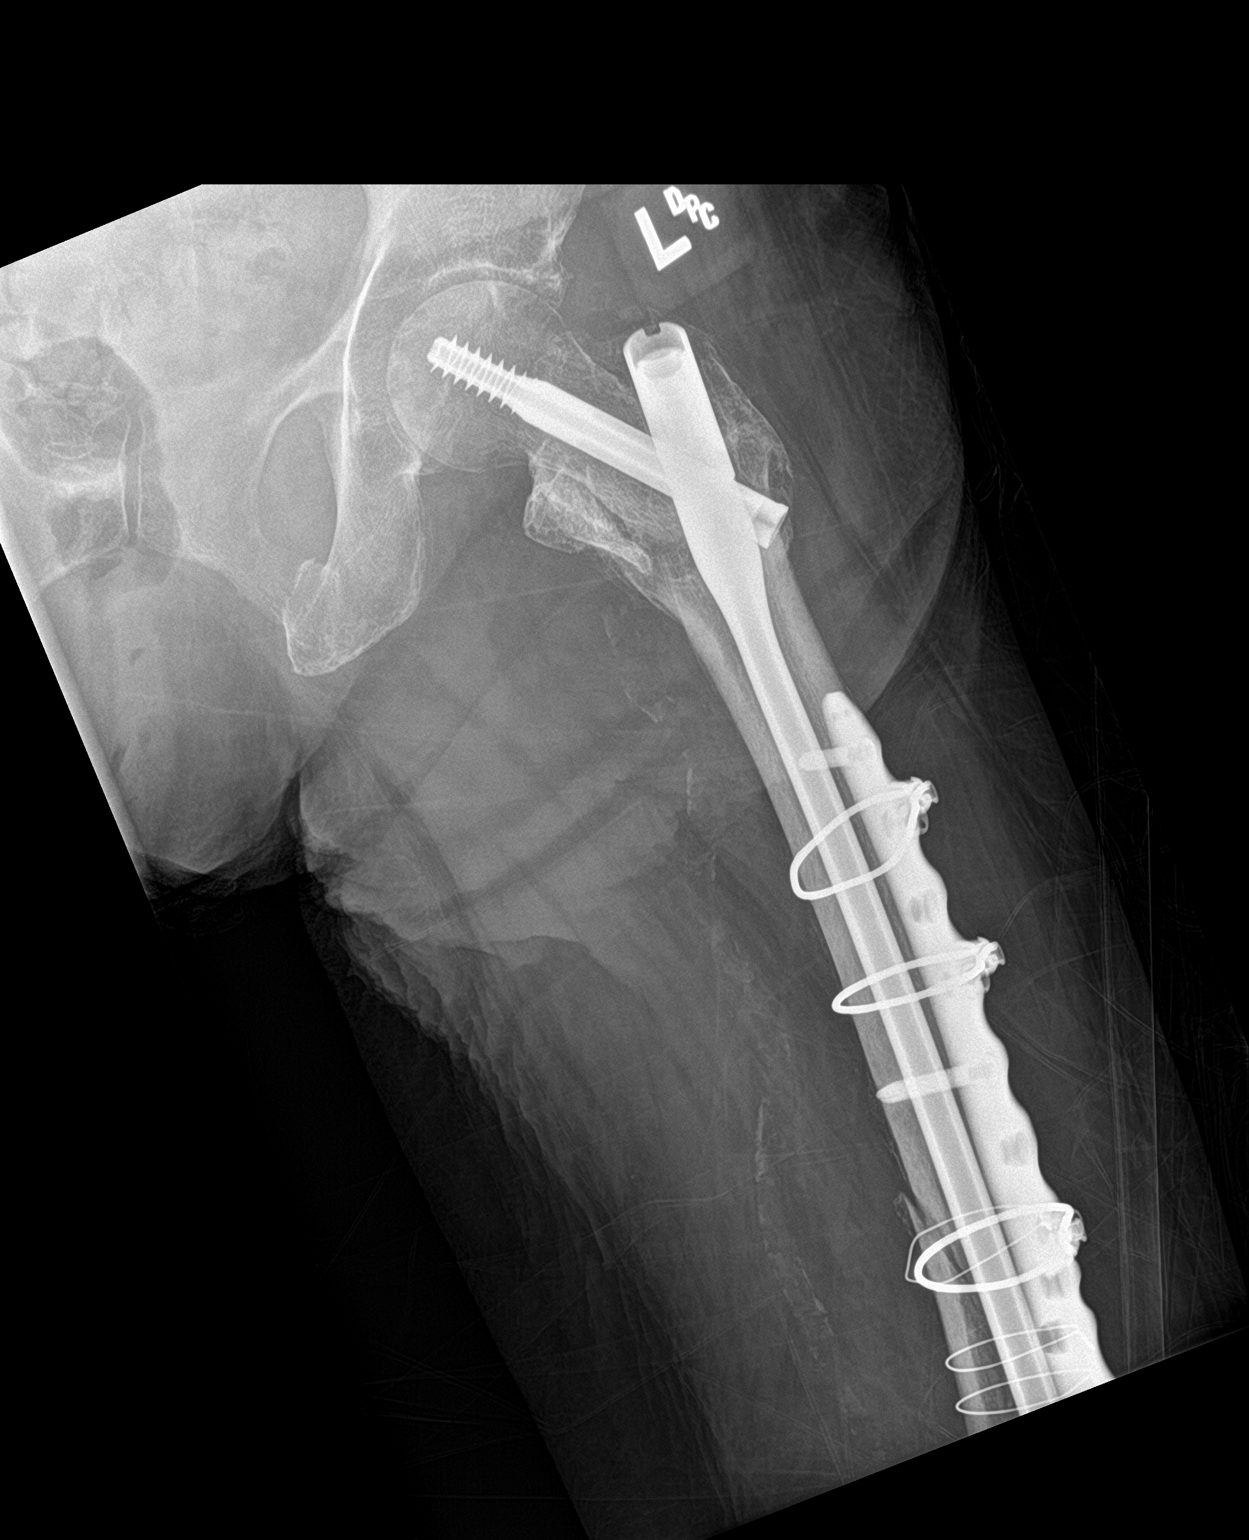

[3 of 3 positions shown; findings below may reference images not displayed]

FINDINGS: Postoperative internal fixation of the previous inter trochanteric
fracture of the left proximal femur. There is a long stent
intramedullary rod with compression bolt and plate and screw with
cerclage wires along the shaft. Additional oblique fracture along
the mid femoral shaft, incompletely included. The left femoral
fractures appear unchanged since the previous study. There does
appear to be a new fracture of the superior and inferior left pubic
rami, not definitely present on the prior study. Right hemipelvis
and right hip per grossly intact. SI joints and symphysis pubis are
not displaced. Prominent vascular calcifications.
IMPRESSION: 1. New acute fractures of the left superior and inferior pubic rami.
2. Old inter trochanteric and midshaft fractures of the left femur
post internal fixation.

## 2018-04-03 IMAGING — CT CT PELVIS W/O CM
2 of 3 series · 15 of 46 positions shown, 17 images · non-contrast
Comparison: Left hip x-rays September 10, 2017 and pelvis films [place]

CLINICAL DATA: Pain after fall

EXAM:
CT PELVIS WITHOUT CONTRAST
TECHNIQUE: Multidetector CT imaging of the pelvis was performed following the
standard protocol without intravenous contrast.

[Series 3: axial (person_name) (person_name) · axial · 0.67mm/px · z∈[-759,-539]mm · 12 of 128 slices shown, 14 images]
[im 9/128  soft-tissue]
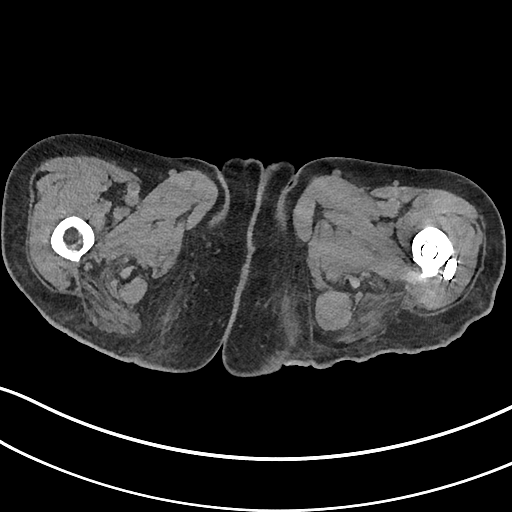
[im 9/128  bone]
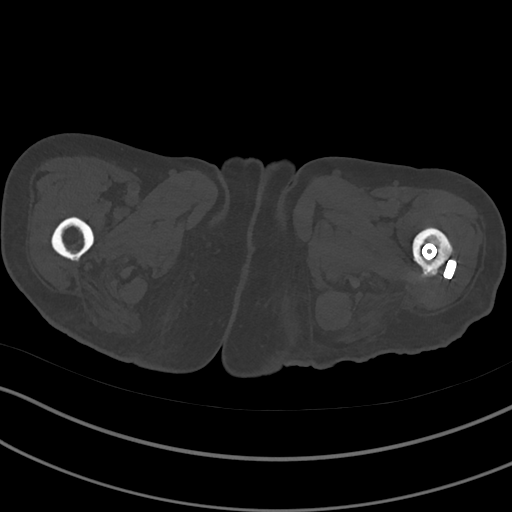
[im 17/128  soft-tissue]
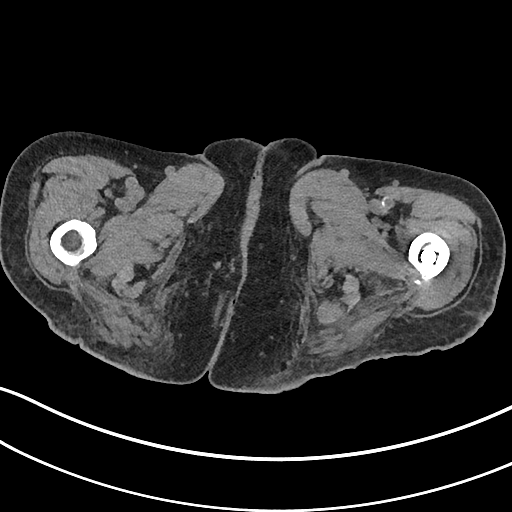
[im 29/128  soft-tissue]
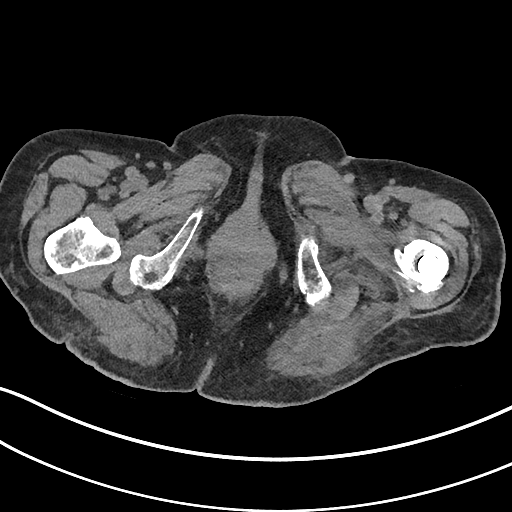
[im 37/128  soft-tissue]
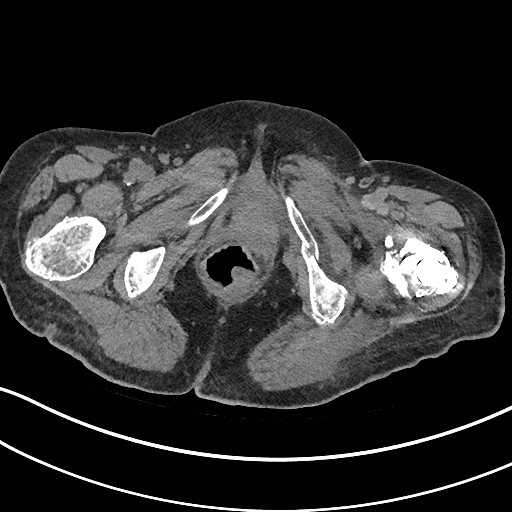
[im 50/128  soft-tissue]
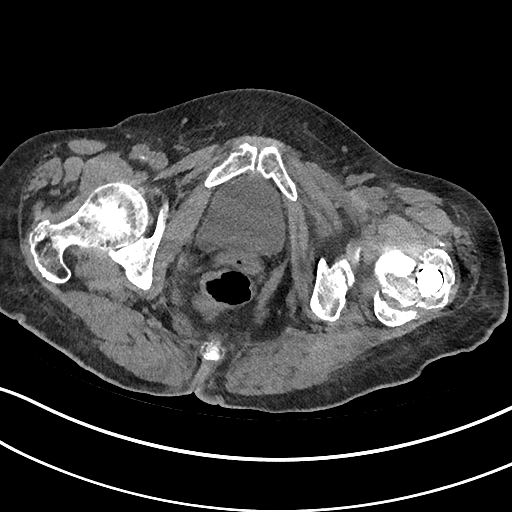
[im 58/128  soft-tissue]
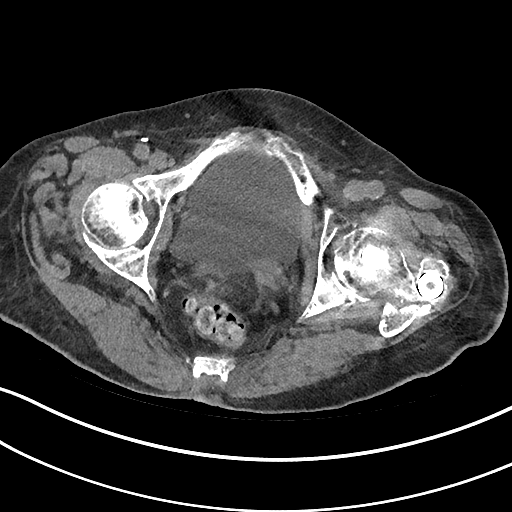
[im 70/128  soft-tissue]
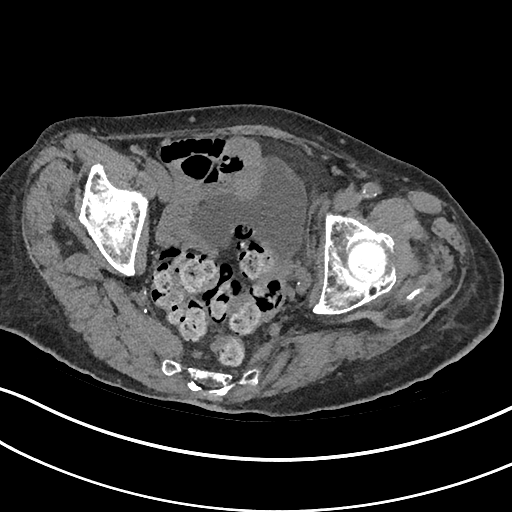
[im 78/128  soft-tissue]
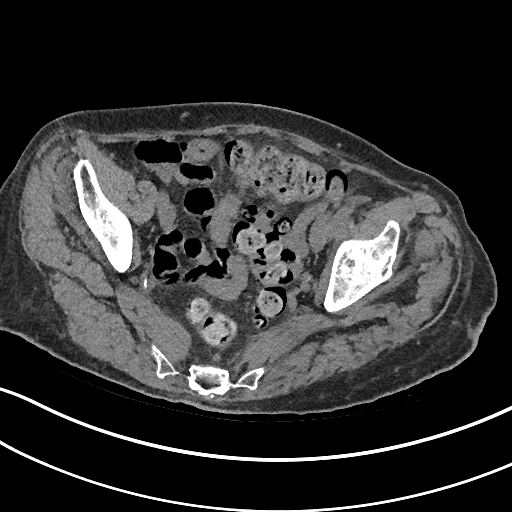
[im 91/128  soft-tissue]
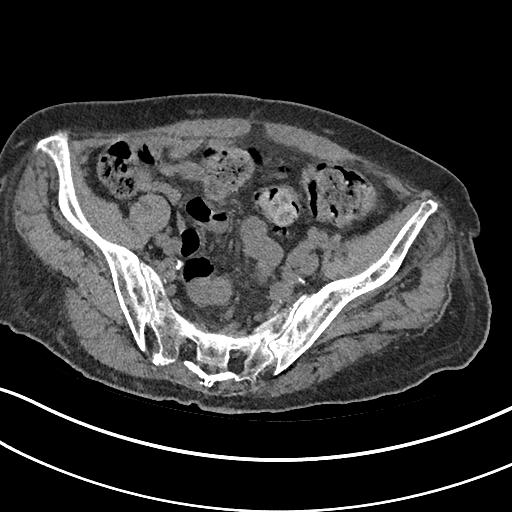
[im 91/128  bone]
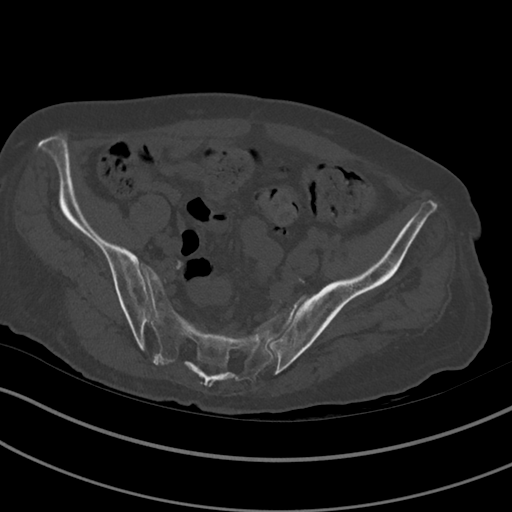
[im 99/128  soft-tissue]
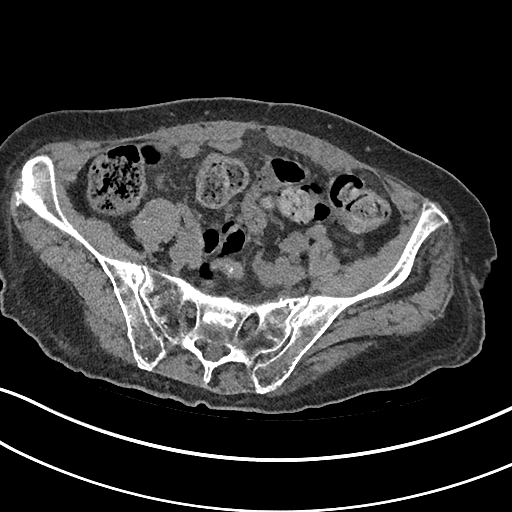
[im 111/128  soft-tissue]
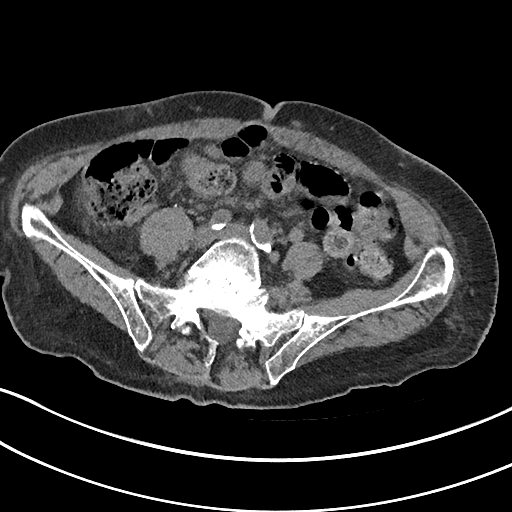
[im 119/128  soft-tissue]
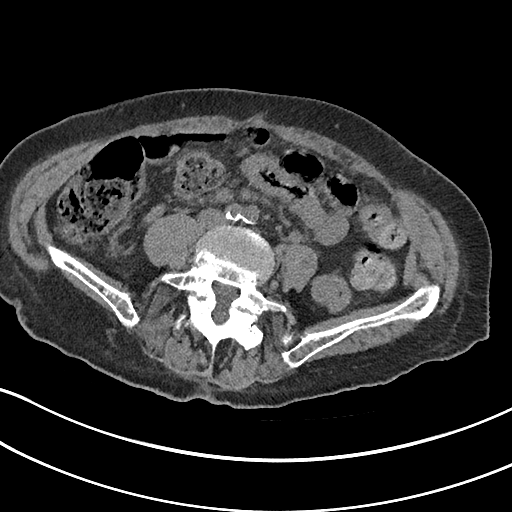

[Series 8: coronal st · coronal · 0.50mm/px · 3 of 129 slices shown]
[im 43/129  soft-tissue]
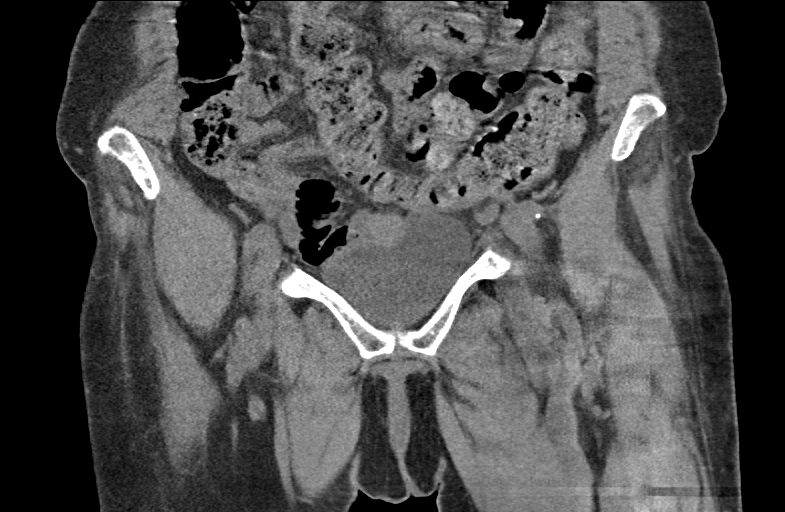
[im 57/129  soft-tissue]
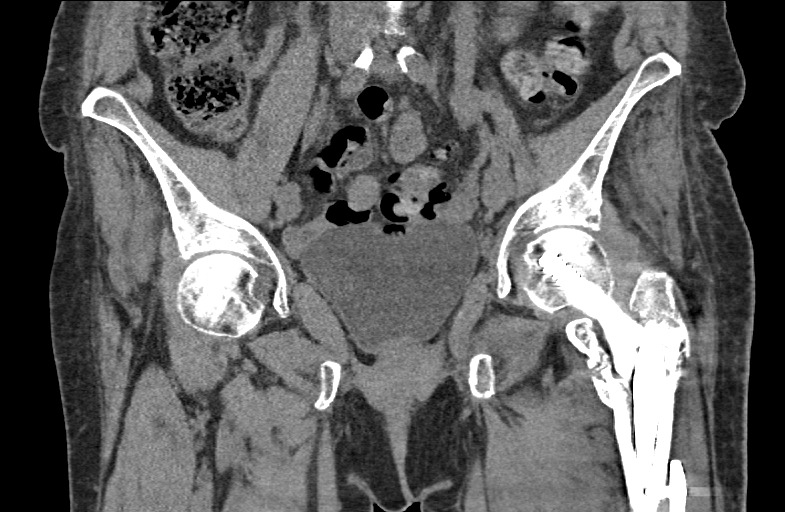
[im 72/129  soft-tissue]
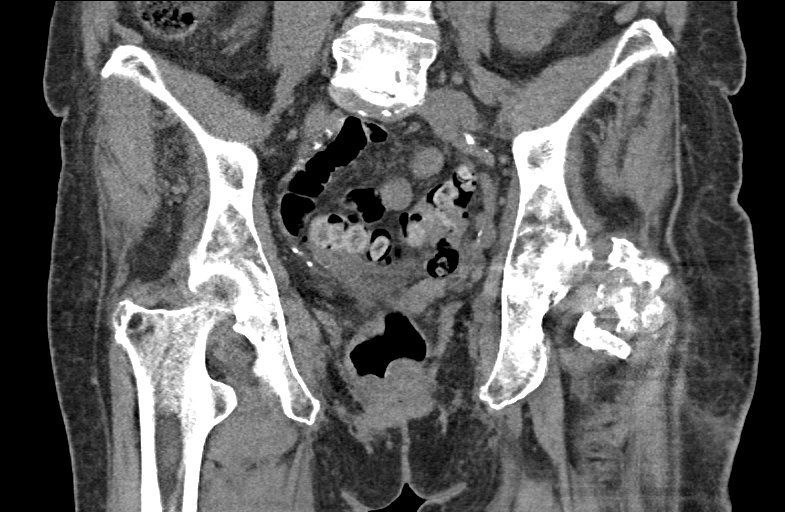

[15 of 46 positions shown; findings below may reference images not displayed]

FINDINGS: Urinary Tract:  The bladder and distal ureters are normal.

Bowel:  Fecal loading in the colon.

Vascular/Lymphatic: Atherosclerotic changes in the distal abdominal
aorta, iliac vessels, and femoral vessels. No adenopathy.

Reproductive:  Status post hysterectomy.

Other: There is a small amount of ascites in the pelvis which may be
posttraumatic given fractures described below.

Musculoskeletal: The right hip, right acetabulum, and right iliac
bone are normal. Pars defects in the lower lumbar spine are chronic
in appearance. Grade 2 anterolisthesis of L5 versus S1 is
identified. This is probably due to the pars defects and is likely
chronic. No acute fractures in the lumbar spine. There is a fracture
through the left side of the sacrum. The left iliac crest is normal
in appearance. There is a fracture through the left acetabulum which
is seen anteriorly on axial image 61. There is a displaced fracture
through the left inferior pubic ramus. There is a subtle fracture
through the left superior pubic ramus at extending into the pubic
symphysis as seen on coronal image 31. No widening of the pubic
symphysis. It is difficult to assess the acuity of the proximal left
femoral fractures. The patient had an intertrochanteric fracture
which occurred in Monday February, 2017. A gamma nail and rod extend across
these fractures. Several lucencies remain. I suspect most of the
fractures in the proximal left femur are chronic but it would be
difficult to exclude an acute on chronic fracture. However, the
hardware remains intact. No dislocation.
IMPRESSION: 1. There is a fracture extending from superior to inferior and
anterior posterior through the left side of the sacrum. There is a
fracture through the anterior left acetabulum in addition to
fractures in the left inferior pubic ramus and left superior pubic
ramus, extending slightly into the pubic symphysis which is not
widened. The fractures through the proximal femur are are favored to
be primarily old. The hardware remains intact. It would be difficult
to completely exclude an acute on chronic fracture in the proximal
left femur.

## 2018-04-06 DIAGNOSIS — Z8673 Personal history of transient ischemic attack (TIA), and cerebral infarction without residual deficits: Secondary | ICD-10-CM | POA: Diagnosis not present

## 2018-04-06 DIAGNOSIS — Z7902 Long term (current) use of antithrombotics/antiplatelets: Secondary | ICD-10-CM | POA: Diagnosis not present

## 2018-04-06 DIAGNOSIS — Z794 Long term (current) use of insulin: Secondary | ICD-10-CM | POA: Diagnosis not present

## 2018-04-06 DIAGNOSIS — Z87891 Personal history of nicotine dependence: Secondary | ICD-10-CM | POA: Diagnosis not present

## 2018-04-06 DIAGNOSIS — E1151 Type 2 diabetes mellitus with diabetic peripheral angiopathy without gangrene: Secondary | ICD-10-CM | POA: Diagnosis not present

## 2018-04-06 DIAGNOSIS — M1991 Primary osteoarthritis, unspecified site: Secondary | ICD-10-CM | POA: Diagnosis not present

## 2018-04-06 DIAGNOSIS — M5136 Other intervertebral disc degeneration, lumbar region: Secondary | ICD-10-CM | POA: Diagnosis not present

## 2018-04-06 DIAGNOSIS — Z7982 Long term (current) use of aspirin: Secondary | ICD-10-CM | POA: Diagnosis not present

## 2018-04-06 DIAGNOSIS — I10 Essential (primary) hypertension: Secondary | ICD-10-CM | POA: Diagnosis not present

## 2018-04-06 DIAGNOSIS — G4709 Other insomnia: Secondary | ICD-10-CM | POA: Diagnosis not present

## 2018-04-06 DIAGNOSIS — R627 Adult failure to thrive: Secondary | ICD-10-CM | POA: Diagnosis not present

## 2018-04-06 DIAGNOSIS — G894 Chronic pain syndrome: Secondary | ICD-10-CM | POA: Diagnosis not present

## 2018-04-06 DIAGNOSIS — K5900 Constipation, unspecified: Secondary | ICD-10-CM | POA: Diagnosis not present

## 2018-04-06 DIAGNOSIS — R600 Localized edema: Secondary | ICD-10-CM | POA: Diagnosis not present

## 2018-04-06 DIAGNOSIS — Z6823 Body mass index (BMI) 23.0-23.9, adult: Secondary | ICD-10-CM | POA: Diagnosis not present

## 2018-04-09 DIAGNOSIS — M5136 Other intervertebral disc degeneration, lumbar region: Secondary | ICD-10-CM | POA: Diagnosis not present

## 2018-04-09 DIAGNOSIS — Z7982 Long term (current) use of aspirin: Secondary | ICD-10-CM | POA: Diagnosis not present

## 2018-04-09 DIAGNOSIS — M1991 Primary osteoarthritis, unspecified site: Secondary | ICD-10-CM | POA: Diagnosis not present

## 2018-04-09 DIAGNOSIS — Z794 Long term (current) use of insulin: Secondary | ICD-10-CM | POA: Diagnosis not present

## 2018-04-09 DIAGNOSIS — G894 Chronic pain syndrome: Secondary | ICD-10-CM | POA: Diagnosis not present

## 2018-04-09 DIAGNOSIS — R627 Adult failure to thrive: Secondary | ICD-10-CM | POA: Diagnosis not present

## 2018-04-09 DIAGNOSIS — K5900 Constipation, unspecified: Secondary | ICD-10-CM | POA: Diagnosis not present

## 2018-04-09 DIAGNOSIS — E1151 Type 2 diabetes mellitus with diabetic peripheral angiopathy without gangrene: Secondary | ICD-10-CM | POA: Diagnosis not present

## 2018-04-09 DIAGNOSIS — R600 Localized edema: Secondary | ICD-10-CM | POA: Diagnosis not present

## 2018-04-09 DIAGNOSIS — Z8673 Personal history of transient ischemic attack (TIA), and cerebral infarction without residual deficits: Secondary | ICD-10-CM | POA: Diagnosis not present

## 2018-04-09 DIAGNOSIS — Z87891 Personal history of nicotine dependence: Secondary | ICD-10-CM | POA: Diagnosis not present

## 2018-04-09 DIAGNOSIS — I10 Essential (primary) hypertension: Secondary | ICD-10-CM | POA: Diagnosis not present

## 2018-04-09 DIAGNOSIS — Z6823 Body mass index (BMI) 23.0-23.9, adult: Secondary | ICD-10-CM | POA: Diagnosis not present

## 2018-04-09 DIAGNOSIS — G4709 Other insomnia: Secondary | ICD-10-CM | POA: Diagnosis not present

## 2018-04-09 DIAGNOSIS — Z7902 Long term (current) use of antithrombotics/antiplatelets: Secondary | ICD-10-CM | POA: Diagnosis not present

## 2018-04-11 DIAGNOSIS — Z7409 Other reduced mobility: Secondary | ICD-10-CM | POA: Diagnosis not present

## 2018-04-11 DIAGNOSIS — I69364 Other paralytic syndrome following cerebral infarction affecting left non-dominant side: Secondary | ICD-10-CM | POA: Diagnosis not present

## 2018-04-13 DIAGNOSIS — M1991 Primary osteoarthritis, unspecified site: Secondary | ICD-10-CM | POA: Diagnosis not present

## 2018-04-13 DIAGNOSIS — Z794 Long term (current) use of insulin: Secondary | ICD-10-CM | POA: Diagnosis not present

## 2018-04-13 DIAGNOSIS — M5136 Other intervertebral disc degeneration, lumbar region: Secondary | ICD-10-CM | POA: Diagnosis not present

## 2018-04-13 DIAGNOSIS — R600 Localized edema: Secondary | ICD-10-CM | POA: Diagnosis not present

## 2018-04-13 DIAGNOSIS — Z87891 Personal history of nicotine dependence: Secondary | ICD-10-CM | POA: Diagnosis not present

## 2018-04-13 DIAGNOSIS — G4709 Other insomnia: Secondary | ICD-10-CM | POA: Diagnosis not present

## 2018-04-13 DIAGNOSIS — Z7982 Long term (current) use of aspirin: Secondary | ICD-10-CM | POA: Diagnosis not present

## 2018-04-13 DIAGNOSIS — G894 Chronic pain syndrome: Secondary | ICD-10-CM | POA: Diagnosis not present

## 2018-04-13 DIAGNOSIS — K5900 Constipation, unspecified: Secondary | ICD-10-CM | POA: Diagnosis not present

## 2018-04-13 DIAGNOSIS — E1151 Type 2 diabetes mellitus with diabetic peripheral angiopathy without gangrene: Secondary | ICD-10-CM | POA: Diagnosis not present

## 2018-04-13 DIAGNOSIS — R627 Adult failure to thrive: Secondary | ICD-10-CM | POA: Diagnosis not present

## 2018-04-13 DIAGNOSIS — I10 Essential (primary) hypertension: Secondary | ICD-10-CM | POA: Diagnosis not present

## 2018-04-13 DIAGNOSIS — Z6823 Body mass index (BMI) 23.0-23.9, adult: Secondary | ICD-10-CM | POA: Diagnosis not present

## 2018-04-13 DIAGNOSIS — Z8673 Personal history of transient ischemic attack (TIA), and cerebral infarction without residual deficits: Secondary | ICD-10-CM | POA: Diagnosis not present

## 2018-04-13 DIAGNOSIS — Z7902 Long term (current) use of antithrombotics/antiplatelets: Secondary | ICD-10-CM | POA: Diagnosis not present

## 2018-04-14 DIAGNOSIS — R51 Headache: Secondary | ICD-10-CM | POA: Diagnosis not present

## 2018-04-14 DIAGNOSIS — R627 Adult failure to thrive: Secondary | ICD-10-CM | POA: Diagnosis not present

## 2018-04-14 DIAGNOSIS — M5136 Other intervertebral disc degeneration, lumbar region: Secondary | ICD-10-CM | POA: Diagnosis not present

## 2018-04-14 DIAGNOSIS — Z794 Long term (current) use of insulin: Secondary | ICD-10-CM | POA: Diagnosis not present

## 2018-04-14 DIAGNOSIS — Z7982 Long term (current) use of aspirin: Secondary | ICD-10-CM | POA: Diagnosis not present

## 2018-04-14 DIAGNOSIS — M1991 Primary osteoarthritis, unspecified site: Secondary | ICD-10-CM | POA: Diagnosis not present

## 2018-04-14 DIAGNOSIS — R0989 Other specified symptoms and signs involving the circulatory and respiratory systems: Secondary | ICD-10-CM | POA: Diagnosis not present

## 2018-04-14 DIAGNOSIS — I10 Essential (primary) hypertension: Secondary | ICD-10-CM | POA: Diagnosis not present

## 2018-04-14 DIAGNOSIS — J301 Allergic rhinitis due to pollen: Secondary | ICD-10-CM | POA: Diagnosis not present

## 2018-04-14 DIAGNOSIS — Z8673 Personal history of transient ischemic attack (TIA), and cerebral infarction without residual deficits: Secondary | ICD-10-CM | POA: Diagnosis not present

## 2018-04-14 DIAGNOSIS — R7309 Other abnormal glucose: Secondary | ICD-10-CM | POA: Diagnosis not present

## 2018-04-14 DIAGNOSIS — K5900 Constipation, unspecified: Secondary | ICD-10-CM | POA: Diagnosis not present

## 2018-04-14 DIAGNOSIS — R600 Localized edema: Secondary | ICD-10-CM | POA: Diagnosis not present

## 2018-04-14 DIAGNOSIS — G4709 Other insomnia: Secondary | ICD-10-CM | POA: Diagnosis not present

## 2018-04-14 DIAGNOSIS — Z6823 Body mass index (BMI) 23.0-23.9, adult: Secondary | ICD-10-CM | POA: Diagnosis not present

## 2018-04-14 DIAGNOSIS — G894 Chronic pain syndrome: Secondary | ICD-10-CM | POA: Diagnosis not present

## 2018-04-14 DIAGNOSIS — Z87891 Personal history of nicotine dependence: Secondary | ICD-10-CM | POA: Diagnosis not present

## 2018-04-14 DIAGNOSIS — Z6822 Body mass index (BMI) 22.0-22.9, adult: Secondary | ICD-10-CM | POA: Diagnosis not present

## 2018-04-14 DIAGNOSIS — Z7902 Long term (current) use of antithrombotics/antiplatelets: Secondary | ICD-10-CM | POA: Diagnosis not present

## 2018-04-14 DIAGNOSIS — E1151 Type 2 diabetes mellitus with diabetic peripheral angiopathy without gangrene: Secondary | ICD-10-CM | POA: Diagnosis not present

## 2018-04-15 ENCOUNTER — Other Ambulatory Visit (HOSPITAL_COMMUNITY): Payer: Self-pay | Admitting: Physician Assistant

## 2018-04-15 DIAGNOSIS — Z7902 Long term (current) use of antithrombotics/antiplatelets: Secondary | ICD-10-CM | POA: Diagnosis not present

## 2018-04-15 DIAGNOSIS — Z8673 Personal history of transient ischemic attack (TIA), and cerebral infarction without residual deficits: Secondary | ICD-10-CM | POA: Diagnosis not present

## 2018-04-15 DIAGNOSIS — Z7982 Long term (current) use of aspirin: Secondary | ICD-10-CM | POA: Diagnosis not present

## 2018-04-15 DIAGNOSIS — R627 Adult failure to thrive: Secondary | ICD-10-CM | POA: Diagnosis not present

## 2018-04-15 DIAGNOSIS — Z87891 Personal history of nicotine dependence: Secondary | ICD-10-CM | POA: Diagnosis not present

## 2018-04-15 DIAGNOSIS — E1151 Type 2 diabetes mellitus with diabetic peripheral angiopathy without gangrene: Secondary | ICD-10-CM | POA: Diagnosis not present

## 2018-04-15 DIAGNOSIS — R600 Localized edema: Secondary | ICD-10-CM | POA: Diagnosis not present

## 2018-04-15 DIAGNOSIS — K5900 Constipation, unspecified: Secondary | ICD-10-CM | POA: Diagnosis not present

## 2018-04-15 DIAGNOSIS — Z6823 Body mass index (BMI) 23.0-23.9, adult: Secondary | ICD-10-CM | POA: Diagnosis not present

## 2018-04-15 DIAGNOSIS — I10 Essential (primary) hypertension: Secondary | ICD-10-CM | POA: Diagnosis not present

## 2018-04-15 DIAGNOSIS — Z794 Long term (current) use of insulin: Secondary | ICD-10-CM | POA: Diagnosis not present

## 2018-04-15 DIAGNOSIS — R0989 Other specified symptoms and signs involving the circulatory and respiratory systems: Secondary | ICD-10-CM

## 2018-04-15 DIAGNOSIS — R519 Headache, unspecified: Secondary | ICD-10-CM

## 2018-04-15 DIAGNOSIS — M1991 Primary osteoarthritis, unspecified site: Secondary | ICD-10-CM | POA: Diagnosis not present

## 2018-04-15 DIAGNOSIS — G4709 Other insomnia: Secondary | ICD-10-CM | POA: Diagnosis not present

## 2018-04-15 DIAGNOSIS — R51 Headache: Principal | ICD-10-CM

## 2018-04-15 DIAGNOSIS — M5136 Other intervertebral disc degeneration, lumbar region: Secondary | ICD-10-CM | POA: Diagnosis not present

## 2018-04-15 DIAGNOSIS — G894 Chronic pain syndrome: Secondary | ICD-10-CM | POA: Diagnosis not present

## 2018-04-21 DIAGNOSIS — I10 Essential (primary) hypertension: Secondary | ICD-10-CM | POA: Diagnosis not present

## 2018-04-21 DIAGNOSIS — G4709 Other insomnia: Secondary | ICD-10-CM | POA: Diagnosis not present

## 2018-04-21 DIAGNOSIS — R600 Localized edema: Secondary | ICD-10-CM | POA: Diagnosis not present

## 2018-04-21 DIAGNOSIS — Z87891 Personal history of nicotine dependence: Secondary | ICD-10-CM | POA: Diagnosis not present

## 2018-04-21 DIAGNOSIS — G894 Chronic pain syndrome: Secondary | ICD-10-CM | POA: Diagnosis not present

## 2018-04-21 DIAGNOSIS — K5900 Constipation, unspecified: Secondary | ICD-10-CM | POA: Diagnosis not present

## 2018-04-21 DIAGNOSIS — E1151 Type 2 diabetes mellitus with diabetic peripheral angiopathy without gangrene: Secondary | ICD-10-CM | POA: Diagnosis not present

## 2018-04-21 DIAGNOSIS — Z8673 Personal history of transient ischemic attack (TIA), and cerebral infarction without residual deficits: Secondary | ICD-10-CM | POA: Diagnosis not present

## 2018-04-21 DIAGNOSIS — Z6823 Body mass index (BMI) 23.0-23.9, adult: Secondary | ICD-10-CM | POA: Diagnosis not present

## 2018-04-21 DIAGNOSIS — Z7902 Long term (current) use of antithrombotics/antiplatelets: Secondary | ICD-10-CM | POA: Diagnosis not present

## 2018-04-21 DIAGNOSIS — R627 Adult failure to thrive: Secondary | ICD-10-CM | POA: Diagnosis not present

## 2018-04-21 DIAGNOSIS — Z7982 Long term (current) use of aspirin: Secondary | ICD-10-CM | POA: Diagnosis not present

## 2018-04-21 DIAGNOSIS — M1991 Primary osteoarthritis, unspecified site: Secondary | ICD-10-CM | POA: Diagnosis not present

## 2018-04-21 DIAGNOSIS — Z794 Long term (current) use of insulin: Secondary | ICD-10-CM | POA: Diagnosis not present

## 2018-04-21 DIAGNOSIS — M5136 Other intervertebral disc degeneration, lumbar region: Secondary | ICD-10-CM | POA: Diagnosis not present

## 2018-04-22 ENCOUNTER — Ambulatory Visit (HOSPITAL_COMMUNITY)
Admission: RE | Admit: 2018-04-22 | Discharge: 2018-04-22 | Disposition: A | Discharge status: Home / Self Care | Payer: Medicare Other | Source: Ambulatory Visit | Attending: Physician Assistant | Admitting: Physician Assistant

## 2018-04-22 DIAGNOSIS — E1151 Type 2 diabetes mellitus with diabetic peripheral angiopathy without gangrene: Secondary | ICD-10-CM | POA: Diagnosis not present

## 2018-04-22 DIAGNOSIS — I10 Essential (primary) hypertension: Secondary | ICD-10-CM | POA: Insufficient documentation

## 2018-04-22 DIAGNOSIS — K5900 Constipation, unspecified: Secondary | ICD-10-CM | POA: Diagnosis not present

## 2018-04-22 DIAGNOSIS — M1991 Primary osteoarthritis, unspecified site: Secondary | ICD-10-CM | POA: Diagnosis not present

## 2018-04-22 DIAGNOSIS — R627 Adult failure to thrive: Secondary | ICD-10-CM | POA: Diagnosis not present

## 2018-04-22 DIAGNOSIS — G319 Degenerative disease of nervous system, unspecified: Secondary | ICD-10-CM | POA: Diagnosis not present

## 2018-04-22 DIAGNOSIS — R0989 Other specified symptoms and signs involving the circulatory and respiratory systems: Secondary | ICD-10-CM | POA: Diagnosis not present

## 2018-04-22 DIAGNOSIS — G894 Chronic pain syndrome: Secondary | ICD-10-CM | POA: Diagnosis not present

## 2018-04-22 DIAGNOSIS — E785 Hyperlipidemia, unspecified: Secondary | ICD-10-CM | POA: Insufficient documentation

## 2018-04-22 DIAGNOSIS — I6523 Occlusion and stenosis of bilateral carotid arteries: Secondary | ICD-10-CM | POA: Insufficient documentation

## 2018-04-22 DIAGNOSIS — R600 Localized edema: Secondary | ICD-10-CM | POA: Diagnosis not present

## 2018-04-22 DIAGNOSIS — I2510 Atherosclerotic heart disease of native coronary artery without angina pectoris: Secondary | ICD-10-CM | POA: Diagnosis not present

## 2018-04-22 DIAGNOSIS — Z794 Long term (current) use of insulin: Secondary | ICD-10-CM | POA: Diagnosis not present

## 2018-04-22 DIAGNOSIS — Z7982 Long term (current) use of aspirin: Secondary | ICD-10-CM | POA: Diagnosis not present

## 2018-04-22 DIAGNOSIS — R51 Headache: Secondary | ICD-10-CM | POA: Diagnosis not present

## 2018-04-22 DIAGNOSIS — Z87891 Personal history of nicotine dependence: Secondary | ICD-10-CM | POA: Diagnosis not present

## 2018-04-22 DIAGNOSIS — Z8673 Personal history of transient ischemic attack (TIA), and cerebral infarction without residual deficits: Secondary | ICD-10-CM | POA: Diagnosis not present

## 2018-04-22 DIAGNOSIS — M5136 Other intervertebral disc degeneration, lumbar region: Secondary | ICD-10-CM | POA: Diagnosis not present

## 2018-04-22 DIAGNOSIS — Z7902 Long term (current) use of antithrombotics/antiplatelets: Secondary | ICD-10-CM | POA: Diagnosis not present

## 2018-04-22 DIAGNOSIS — R519 Headache, unspecified: Secondary | ICD-10-CM

## 2018-04-22 DIAGNOSIS — Z6823 Body mass index (BMI) 23.0-23.9, adult: Secondary | ICD-10-CM | POA: Diagnosis not present

## 2018-04-22 DIAGNOSIS — G4709 Other insomnia: Secondary | ICD-10-CM | POA: Diagnosis not present

## 2018-04-23 DIAGNOSIS — Z794 Long term (current) use of insulin: Secondary | ICD-10-CM | POA: Diagnosis not present

## 2018-04-23 DIAGNOSIS — Z7982 Long term (current) use of aspirin: Secondary | ICD-10-CM | POA: Diagnosis not present

## 2018-04-23 DIAGNOSIS — M5136 Other intervertebral disc degeneration, lumbar region: Secondary | ICD-10-CM | POA: Diagnosis not present

## 2018-04-23 DIAGNOSIS — Z87891 Personal history of nicotine dependence: Secondary | ICD-10-CM | POA: Diagnosis not present

## 2018-04-23 DIAGNOSIS — Z6823 Body mass index (BMI) 23.0-23.9, adult: Secondary | ICD-10-CM | POA: Diagnosis not present

## 2018-04-23 DIAGNOSIS — M1991 Primary osteoarthritis, unspecified site: Secondary | ICD-10-CM | POA: Diagnosis not present

## 2018-04-23 DIAGNOSIS — Z8673 Personal history of transient ischemic attack (TIA), and cerebral infarction without residual deficits: Secondary | ICD-10-CM | POA: Diagnosis not present

## 2018-04-23 DIAGNOSIS — Z7902 Long term (current) use of antithrombotics/antiplatelets: Secondary | ICD-10-CM | POA: Diagnosis not present

## 2018-04-23 DIAGNOSIS — R627 Adult failure to thrive: Secondary | ICD-10-CM | POA: Diagnosis not present

## 2018-04-23 DIAGNOSIS — G4709 Other insomnia: Secondary | ICD-10-CM | POA: Diagnosis not present

## 2018-04-23 DIAGNOSIS — K5900 Constipation, unspecified: Secondary | ICD-10-CM | POA: Diagnosis not present

## 2018-04-23 DIAGNOSIS — I10 Essential (primary) hypertension: Secondary | ICD-10-CM | POA: Diagnosis not present

## 2018-04-23 DIAGNOSIS — R600 Localized edema: Secondary | ICD-10-CM | POA: Diagnosis not present

## 2018-04-23 DIAGNOSIS — E1151 Type 2 diabetes mellitus with diabetic peripheral angiopathy without gangrene: Secondary | ICD-10-CM | POA: Diagnosis not present

## 2018-04-23 DIAGNOSIS — G894 Chronic pain syndrome: Secondary | ICD-10-CM | POA: Diagnosis not present

## 2018-04-27 DIAGNOSIS — M5136 Other intervertebral disc degeneration, lumbar region: Secondary | ICD-10-CM | POA: Diagnosis not present

## 2018-04-27 DIAGNOSIS — R627 Adult failure to thrive: Secondary | ICD-10-CM | POA: Diagnosis not present

## 2018-04-27 DIAGNOSIS — Z87891 Personal history of nicotine dependence: Secondary | ICD-10-CM | POA: Diagnosis not present

## 2018-04-27 DIAGNOSIS — Z794 Long term (current) use of insulin: Secondary | ICD-10-CM | POA: Diagnosis not present

## 2018-04-27 DIAGNOSIS — Z7902 Long term (current) use of antithrombotics/antiplatelets: Secondary | ICD-10-CM | POA: Diagnosis not present

## 2018-04-27 DIAGNOSIS — Z6823 Body mass index (BMI) 23.0-23.9, adult: Secondary | ICD-10-CM | POA: Diagnosis not present

## 2018-04-27 DIAGNOSIS — Z7982 Long term (current) use of aspirin: Secondary | ICD-10-CM | POA: Diagnosis not present

## 2018-04-27 DIAGNOSIS — Z8673 Personal history of transient ischemic attack (TIA), and cerebral infarction without residual deficits: Secondary | ICD-10-CM | POA: Diagnosis not present

## 2018-04-27 DIAGNOSIS — R600 Localized edema: Secondary | ICD-10-CM | POA: Diagnosis not present

## 2018-04-27 DIAGNOSIS — G894 Chronic pain syndrome: Secondary | ICD-10-CM | POA: Diagnosis not present

## 2018-04-27 DIAGNOSIS — K5900 Constipation, unspecified: Secondary | ICD-10-CM | POA: Diagnosis not present

## 2018-04-27 DIAGNOSIS — E1151 Type 2 diabetes mellitus with diabetic peripheral angiopathy without gangrene: Secondary | ICD-10-CM | POA: Diagnosis not present

## 2018-04-27 DIAGNOSIS — M1991 Primary osteoarthritis, unspecified site: Secondary | ICD-10-CM | POA: Diagnosis not present

## 2018-04-27 DIAGNOSIS — I10 Essential (primary) hypertension: Secondary | ICD-10-CM | POA: Diagnosis not present

## 2018-04-27 DIAGNOSIS — G4709 Other insomnia: Secondary | ICD-10-CM | POA: Diagnosis not present

## 2018-04-29 DIAGNOSIS — Z8673 Personal history of transient ischemic attack (TIA), and cerebral infarction without residual deficits: Secondary | ICD-10-CM | POA: Diagnosis not present

## 2018-04-29 DIAGNOSIS — M5136 Other intervertebral disc degeneration, lumbar region: Secondary | ICD-10-CM | POA: Diagnosis not present

## 2018-04-29 DIAGNOSIS — Z794 Long term (current) use of insulin: Secondary | ICD-10-CM | POA: Diagnosis not present

## 2018-04-29 DIAGNOSIS — K5900 Constipation, unspecified: Secondary | ICD-10-CM | POA: Diagnosis not present

## 2018-04-29 DIAGNOSIS — Z87891 Personal history of nicotine dependence: Secondary | ICD-10-CM | POA: Diagnosis not present

## 2018-04-29 DIAGNOSIS — E1151 Type 2 diabetes mellitus with diabetic peripheral angiopathy without gangrene: Secondary | ICD-10-CM | POA: Diagnosis not present

## 2018-04-29 DIAGNOSIS — Z7982 Long term (current) use of aspirin: Secondary | ICD-10-CM | POA: Diagnosis not present

## 2018-04-29 DIAGNOSIS — Z6823 Body mass index (BMI) 23.0-23.9, adult: Secondary | ICD-10-CM | POA: Diagnosis not present

## 2018-04-29 DIAGNOSIS — I10 Essential (primary) hypertension: Secondary | ICD-10-CM | POA: Diagnosis not present

## 2018-04-29 DIAGNOSIS — G894 Chronic pain syndrome: Secondary | ICD-10-CM | POA: Diagnosis not present

## 2018-04-29 DIAGNOSIS — Z7902 Long term (current) use of antithrombotics/antiplatelets: Secondary | ICD-10-CM | POA: Diagnosis not present

## 2018-04-29 DIAGNOSIS — R627 Adult failure to thrive: Secondary | ICD-10-CM | POA: Diagnosis not present

## 2018-04-29 DIAGNOSIS — G4709 Other insomnia: Secondary | ICD-10-CM | POA: Diagnosis not present

## 2018-04-29 DIAGNOSIS — M1991 Primary osteoarthritis, unspecified site: Secondary | ICD-10-CM | POA: Diagnosis not present

## 2018-04-29 DIAGNOSIS — R600 Localized edema: Secondary | ICD-10-CM | POA: Diagnosis not present

## 2018-05-03 ENCOUNTER — Other Ambulatory Visit: Payer: Self-pay

## 2018-05-03 DIAGNOSIS — E1142 Type 2 diabetes mellitus with diabetic polyneuropathy: Secondary | ICD-10-CM | POA: Diagnosis not present

## 2018-05-03 DIAGNOSIS — L851 Acquired keratosis [keratoderma] palmaris et plantaris: Secondary | ICD-10-CM | POA: Diagnosis not present

## 2018-05-03 DIAGNOSIS — B351 Tinea unguium: Secondary | ICD-10-CM | POA: Diagnosis not present

## 2018-05-03 NOTE — Patient Outreach (Signed)
[facility] [facility]) Care Management  05/03/2018   [name] 1941/08/12 [id number]  Subjective: Telephone call to the patient for assessment.  HIPAA verified. Patient states that she is doing well.  She denies any chest pain, swelling or falls.  She states that she did not weigh today.  She states that she is adherent with her medications.  She states that she is watching the salt in her diet.  I discussed signs and symptoms with the patient and advised her to weigh daily.  She verbalized understanding.  She has an appointment with her physician next month. Discussed with the patient about referring her to the [facility] Heart failure program and she agreed.   Current Medications:  Current Outpatient Medications  Medication Sig Dispense Refill  . aspirin EC 81 MG tablet Take 81 mg by mouth daily.    Marland Kitchen atorvastatin (LIPITOR) 40 MG tablet Take 40 mg by mouth daily.    . carvedilol (COREG) 12.5 MG tablet Take 1 tablet (12.5 mg total) by mouth 2 (two) times daily with a meal. 60 tablet 1  . diltiazem (CARDIZEM CD) 120 MG 24 hr capsule Take 120 mg by mouth daily.    . furosemide (LASIX) 20 MG tablet Take 20 mg by mouth daily.    . isosorbide mononitrate (IMDUR) 30 MG 24 hr tablet Take 30 mg by mouth daily.     Marland Kitchen lisinopril (PRINIVIL,ZESTRIL) 20 MG tablet Take 20 mg by mouth daily.    . meloxicam (MOBIC) 7.5 MG tablet Take 7.5 mg by mouth daily.    Marland Kitchen omeprazole (PRILOSEC) 20 MG capsule Take 1 capsule (20 mg total) by mouth daily. 90 capsule 3  . ondansetron (ZOFRAN) 4 MG tablet Take 1 tablet (4 mg total) by mouth every 8 (eight) hours as needed for nausea or vomiting. 30 tablet 1  . traZODone (DESYREL) 50 MG tablet Take 50 mg by mouth at bedtime.    . ciprofloxacin (CIPRO) 500 MG tablet Take 1 tablet by mouth 2 (two) times daily.    . DiphenhydrAMINE HCl, Sleep, (SLEEP AID) 25 MG CAPS Take 1-2 capsules by mouth at bedtime as needed (for sleep).    . ondansetron (ZOFRAN ODT) 4 MG  disintegrating tablet Take 1 tablet (4 mg total) by mouth every 8 (eight) hours as needed for nausea or vomiting. (Patient not taking: Reported on 03/02/2018) 12 tablet 1  . phenazopyridine (PYRIDIUM) 95 MG tablet Take 95 mg by mouth 3 (three) times daily as needed for pain.    . phenytoin (DILANTIN) 200 MG ER capsule Take 1 capsule (200 mg total) by mouth daily. (Patient not taking: Reported on 03/02/2018) 30 capsule 1   No current facility-administered medications for this visit.     Functional Status:  In your present state of health, do you have any difficulty performing the following activities: 12/28/2017 09/15/2017  Hearing? N N  Vision? N Y  Difficulty concentrating or making decisions? N N  Walking or climbing stairs? Y Y  Dressing or bathing? N Y  Doing errands, shopping? [name]  Preparing Food and eating ? Y Y  Using the Toilet? N Y  In the past six months, have you accidently leaked urine? N N  Do you have problems with loss of bowel control? N N  Managing your Medications? N N  Managing your Finances? N N  Housekeeping or managing your Housekeeping? Y Y  Some recent data might be hidden    Fall/Depression Screening: Fall Risk  05/03/2018  04/01/2018 03/02/2018  Falls in the past year? No No No  Number falls in past yr: - - -  Injury with Fall? - - -  Risk Factor Category  - - -  Risk for fall due to : - - -  Risk for fall due to: Comment - - -  Follow up - - -   PHQ 2/9 Scores 12/28/2017 09/15/2017 07/06/2017 03/09/2017  PHQ - 2 Score 0 1 0 0    Assessment: Patient will continue to benefit from health coach outreach for disease management and support  [facility] CM Care Plan Problem One     Most Recent Value  THN Long Term Goal   In 90 days the patient will verbalize that she has not been seen in the emergency room  [facility] Long Term Goal Start Date  05/03/18  Interventions for Problem One Long Term Goal  Reviewed medication, diet and heart failure signs and sypmtoms      Plan: [place] will refer the to [place] program.

## 2018-05-04 DIAGNOSIS — M5136 Other intervertebral disc degeneration, lumbar region: Secondary | ICD-10-CM | POA: Diagnosis not present

## 2018-05-04 DIAGNOSIS — G894 Chronic pain syndrome: Secondary | ICD-10-CM | POA: Diagnosis not present

## 2018-05-04 DIAGNOSIS — Z87891 Personal history of nicotine dependence: Secondary | ICD-10-CM | POA: Diagnosis not present

## 2018-05-04 DIAGNOSIS — R627 Adult failure to thrive: Secondary | ICD-10-CM | POA: Diagnosis not present

## 2018-05-04 DIAGNOSIS — Z7982 Long term (current) use of aspirin: Secondary | ICD-10-CM | POA: Diagnosis not present

## 2018-05-04 DIAGNOSIS — R600 Localized edema: Secondary | ICD-10-CM | POA: Diagnosis not present

## 2018-05-04 DIAGNOSIS — K5900 Constipation, unspecified: Secondary | ICD-10-CM | POA: Diagnosis not present

## 2018-05-04 DIAGNOSIS — Z7902 Long term (current) use of antithrombotics/antiplatelets: Secondary | ICD-10-CM | POA: Diagnosis not present

## 2018-05-04 DIAGNOSIS — Z794 Long term (current) use of insulin: Secondary | ICD-10-CM | POA: Diagnosis not present

## 2018-05-04 DIAGNOSIS — M1991 Primary osteoarthritis, unspecified site: Secondary | ICD-10-CM | POA: Diagnosis not present

## 2018-05-04 DIAGNOSIS — Z8673 Personal history of transient ischemic attack (TIA), and cerebral infarction without residual deficits: Secondary | ICD-10-CM | POA: Diagnosis not present

## 2018-05-04 DIAGNOSIS — G4709 Other insomnia: Secondary | ICD-10-CM | POA: Diagnosis not present

## 2018-05-04 DIAGNOSIS — E1151 Type 2 diabetes mellitus with diabetic peripheral angiopathy without gangrene: Secondary | ICD-10-CM | POA: Diagnosis not present

## 2018-05-04 DIAGNOSIS — I10 Essential (primary) hypertension: Secondary | ICD-10-CM | POA: Diagnosis not present

## 2018-05-04 DIAGNOSIS — Z6823 Body mass index (BMI) 23.0-23.9, adult: Secondary | ICD-10-CM | POA: Diagnosis not present

## 2018-05-06 DIAGNOSIS — M1991 Primary osteoarthritis, unspecified site: Secondary | ICD-10-CM | POA: Diagnosis not present

## 2018-05-06 DIAGNOSIS — K5900 Constipation, unspecified: Secondary | ICD-10-CM | POA: Diagnosis not present

## 2018-05-06 DIAGNOSIS — R600 Localized edema: Secondary | ICD-10-CM | POA: Diagnosis not present

## 2018-05-06 DIAGNOSIS — Z7902 Long term (current) use of antithrombotics/antiplatelets: Secondary | ICD-10-CM | POA: Diagnosis not present

## 2018-05-06 DIAGNOSIS — Z6823 Body mass index (BMI) 23.0-23.9, adult: Secondary | ICD-10-CM | POA: Diagnosis not present

## 2018-05-06 DIAGNOSIS — G894 Chronic pain syndrome: Secondary | ICD-10-CM | POA: Diagnosis not present

## 2018-05-06 DIAGNOSIS — Z794 Long term (current) use of insulin: Secondary | ICD-10-CM | POA: Diagnosis not present

## 2018-05-06 DIAGNOSIS — I10 Essential (primary) hypertension: Secondary | ICD-10-CM | POA: Diagnosis not present

## 2018-05-06 DIAGNOSIS — G4709 Other insomnia: Secondary | ICD-10-CM | POA: Diagnosis not present

## 2018-05-06 DIAGNOSIS — R627 Adult failure to thrive: Secondary | ICD-10-CM | POA: Diagnosis not present

## 2018-05-06 DIAGNOSIS — M5136 Other intervertebral disc degeneration, lumbar region: Secondary | ICD-10-CM | POA: Diagnosis not present

## 2018-05-06 DIAGNOSIS — Z8673 Personal history of transient ischemic attack (TIA), and cerebral infarction without residual deficits: Secondary | ICD-10-CM | POA: Diagnosis not present

## 2018-05-06 DIAGNOSIS — Z87891 Personal history of nicotine dependence: Secondary | ICD-10-CM | POA: Diagnosis not present

## 2018-05-06 DIAGNOSIS — I959 Hypotension, unspecified: Secondary | ICD-10-CM | POA: Diagnosis not present

## 2018-05-06 DIAGNOSIS — E1151 Type 2 diabetes mellitus with diabetic peripheral angiopathy without gangrene: Secondary | ICD-10-CM | POA: Diagnosis not present

## 2018-05-06 DIAGNOSIS — R29810 Facial weakness: Secondary | ICD-10-CM | POA: Diagnosis not present

## 2018-05-06 DIAGNOSIS — Z7982 Long term (current) use of aspirin: Secondary | ICD-10-CM | POA: Diagnosis not present

## 2018-05-07 ENCOUNTER — Inpatient Hospital Stay (HOSPITAL_COMMUNITY)
Admission: EM | Admit: 2018-05-07 | Discharge: 2018-05-08 | DRG: 065 | Disposition: A | Discharge status: Home Health | Payer: Medicare Other | Attending: Internal Medicine | Admitting: Internal Medicine

## 2018-05-07 ENCOUNTER — Emergency Department (HOSPITAL_COMMUNITY): Payer: Medicare Other

## 2018-05-07 ENCOUNTER — Encounter (HOSPITAL_COMMUNITY): Payer: Self-pay

## 2018-05-07 ENCOUNTER — Other Ambulatory Visit: Payer: Self-pay

## 2018-05-07 ENCOUNTER — Observation Stay (HOSPITAL_COMMUNITY): Payer: Medicare Other

## 2018-05-07 DIAGNOSIS — N179 Acute kidney failure, unspecified: Secondary | ICD-10-CM | POA: Diagnosis not present

## 2018-05-07 DIAGNOSIS — E119 Type 2 diabetes mellitus without complications: Secondary | ICD-10-CM | POA: Diagnosis not present

## 2018-05-07 DIAGNOSIS — Z791 Long term (current) use of non-steroidal anti-inflammatories (NSAID): Secondary | ICD-10-CM

## 2018-05-07 DIAGNOSIS — R4781 Slurred speech: Secondary | ICD-10-CM | POA: Diagnosis not present

## 2018-05-07 DIAGNOSIS — Z9842 Cataract extraction status, left eye: Secondary | ICD-10-CM

## 2018-05-07 DIAGNOSIS — G4733 Obstructive sleep apnea (adult) (pediatric): Secondary | ICD-10-CM | POA: Diagnosis present

## 2018-05-07 DIAGNOSIS — D696 Thrombocytopenia, unspecified: Secondary | ICD-10-CM | POA: Diagnosis not present

## 2018-05-07 DIAGNOSIS — I69952 Hemiplegia and hemiparesis following unspecified cerebrovascular disease affecting left dominant side: Secondary | ICD-10-CM | POA: Diagnosis not present

## 2018-05-07 DIAGNOSIS — F17210 Nicotine dependence, cigarettes, uncomplicated: Secondary | ICD-10-CM | POA: Diagnosis present

## 2018-05-07 DIAGNOSIS — Z90710 Acquired absence of both cervix and uterus: Secondary | ICD-10-CM

## 2018-05-07 DIAGNOSIS — K589 Irritable bowel syndrome without diarrhea: Secondary | ICD-10-CM | POA: Diagnosis present

## 2018-05-07 DIAGNOSIS — I959 Hypotension, unspecified: Secondary | ICD-10-CM | POA: Diagnosis not present

## 2018-05-07 DIAGNOSIS — K7460 Unspecified cirrhosis of liver: Secondary | ICD-10-CM | POA: Diagnosis present

## 2018-05-07 DIAGNOSIS — G8384 Todd's paralysis (postepileptic): Secondary | ICD-10-CM | POA: Diagnosis present

## 2018-05-07 DIAGNOSIS — Z955 Presence of coronary angioplasty implant and graft: Secondary | ICD-10-CM

## 2018-05-07 DIAGNOSIS — I8500 Esophageal varices without bleeding: Secondary | ICD-10-CM | POA: Diagnosis present

## 2018-05-07 DIAGNOSIS — E7800 Pure hypercholesterolemia, unspecified: Secondary | ICD-10-CM | POA: Diagnosis present

## 2018-05-07 DIAGNOSIS — I10 Essential (primary) hypertension: Secondary | ICD-10-CM | POA: Diagnosis not present

## 2018-05-07 DIAGNOSIS — I63511 Cerebral infarction due to unspecified occlusion or stenosis of right middle cerebral artery: Secondary | ICD-10-CM | POA: Diagnosis not present

## 2018-05-07 DIAGNOSIS — I2510 Atherosclerotic heart disease of native coronary artery without angina pectoris: Secondary | ICD-10-CM | POA: Diagnosis present

## 2018-05-07 DIAGNOSIS — K219 Gastro-esophageal reflux disease without esophagitis: Secondary | ICD-10-CM | POA: Diagnosis not present

## 2018-05-07 DIAGNOSIS — Z79899 Other long term (current) drug therapy: Secondary | ICD-10-CM

## 2018-05-07 DIAGNOSIS — R531 Weakness: Secondary | ICD-10-CM | POA: Diagnosis not present

## 2018-05-07 DIAGNOSIS — I471 Supraventricular tachycardia: Secondary | ICD-10-CM | POA: Diagnosis not present

## 2018-05-07 DIAGNOSIS — G459 Transient cerebral ischemic attack, unspecified: Secondary | ICD-10-CM | POA: Diagnosis not present

## 2018-05-07 DIAGNOSIS — I639 Cerebral infarction, unspecified: Secondary | ICD-10-CM

## 2018-05-07 DIAGNOSIS — I351 Nonrheumatic aortic (valve) insufficiency: Secondary | ICD-10-CM | POA: Diagnosis present

## 2018-05-07 DIAGNOSIS — I6601 Occlusion and stenosis of right middle cerebral artery: Secondary | ICD-10-CM | POA: Diagnosis not present

## 2018-05-07 DIAGNOSIS — I361 Nonrheumatic tricuspid (valve) insufficiency: Secondary | ICD-10-CM | POA: Diagnosis not present

## 2018-05-07 DIAGNOSIS — I6523 Occlusion and stenosis of bilateral carotid arteries: Secondary | ICD-10-CM | POA: Diagnosis not present

## 2018-05-07 DIAGNOSIS — R2689 Other abnormalities of gait and mobility: Secondary | ICD-10-CM | POA: Diagnosis not present

## 2018-05-07 DIAGNOSIS — Z9841 Cataract extraction status, right eye: Secondary | ICD-10-CM

## 2018-05-07 DIAGNOSIS — E860 Dehydration: Secondary | ICD-10-CM | POA: Diagnosis present

## 2018-05-07 DIAGNOSIS — R471 Dysarthria and anarthria: Secondary | ICD-10-CM | POA: Diagnosis present

## 2018-05-07 DIAGNOSIS — K766 Portal hypertension: Secondary | ICD-10-CM | POA: Diagnosis present

## 2018-05-07 DIAGNOSIS — R188 Other ascites: Secondary | ICD-10-CM | POA: Diagnosis not present

## 2018-05-07 DIAGNOSIS — G40909 Epilepsy, unspecified, not intractable, without status epilepticus: Secondary | ICD-10-CM | POA: Diagnosis present

## 2018-05-07 DIAGNOSIS — Z833 Family history of diabetes mellitus: Secondary | ICD-10-CM

## 2018-05-07 DIAGNOSIS — R29705 NIHSS score 5: Secondary | ICD-10-CM | POA: Diagnosis present

## 2018-05-07 DIAGNOSIS — K7581 Nonalcoholic steatohepatitis (NASH): Secondary | ICD-10-CM | POA: Diagnosis present

## 2018-05-07 DIAGNOSIS — I700 Atherosclerosis of aorta: Secondary | ICD-10-CM | POA: Diagnosis present

## 2018-05-07 DIAGNOSIS — G8194 Hemiplegia, unspecified affecting left nondominant side: Secondary | ICD-10-CM | POA: Diagnosis present

## 2018-05-07 DIAGNOSIS — I63532 Cerebral infarction due to unspecified occlusion or stenosis of left posterior cerebral artery: Secondary | ICD-10-CM | POA: Diagnosis not present

## 2018-05-07 DIAGNOSIS — Z961 Presence of intraocular lens: Secondary | ICD-10-CM | POA: Diagnosis present

## 2018-05-07 DIAGNOSIS — Z87440 Personal history of urinary (tract) infections: Secondary | ICD-10-CM

## 2018-05-07 DIAGNOSIS — Z91048 Other nonmedicinal substance allergy status: Secondary | ICD-10-CM

## 2018-05-07 DIAGNOSIS — Z8249 Family history of ischemic heart disease and other diseases of the circulatory system: Secondary | ICD-10-CM

## 2018-05-07 DIAGNOSIS — E785 Hyperlipidemia, unspecified: Secondary | ICD-10-CM | POA: Diagnosis present

## 2018-05-07 DIAGNOSIS — Z7982 Long term (current) use of aspirin: Secondary | ICD-10-CM

## 2018-05-07 LAB — DIFFERENTIAL
BASOS PCT: 0 %
Basophils Absolute: 0 10*3/uL (ref 0.0–0.1)
Eosinophils Absolute: 0.2 10*3/uL (ref 0.0–0.7)
Eosinophils Relative: 4 %
LYMPHS ABS: 1.8 10*3/uL (ref 0.7–4.0)
Lymphocytes Relative: 33 %
MONOS PCT: 9 %
Monocytes Absolute: 0.5 10*3/uL (ref 0.1–1.0)
Neutro Abs: 3 10*3/uL (ref 1.7–7.7)
Neutrophils Relative %: 54 %

## 2018-05-07 LAB — BASIC METABOLIC PANEL
Anion gap: 4 — ABNORMAL LOW (ref 5–15)
BUN: 16 mg/dL (ref 8–23)
CALCIUM: 8.6 mg/dL — AB (ref 8.9–10.3)
CHLORIDE: 114 mmol/L — AB (ref 98–111)
CO2: 25 mmol/L (ref 22–32)
CREATININE: 0.9 mg/dL (ref 0.44–1.00)
GFR calc non Af Amer: >60 mL/min (ref >60)
Glucose, Bld: 162 mg/dL — ABNORMAL HIGH (ref 70–99)
Potassium: 4.5 mmol/L (ref 3.5–5.1)
Sodium: 143 mmol/L (ref 135–145)

## 2018-05-07 LAB — CBG MONITORING, ED: Glucose-Capillary: 173 mg/dL — ABNORMAL HIGH (ref 70–99)

## 2018-05-07 LAB — URINALYSIS, ROUTINE W REFLEX MICROSCOPIC
Bilirubin Urine: NEGATIVE
Glucose, UA: NEGATIVE mg/dL
Hgb urine dipstick: NEGATIVE
KETONES UR: NEGATIVE mg/dL
LEUKOCYTES UA: NEGATIVE
Nitrite: NEGATIVE
PH: 5 (ref 5.0–8.0)
PROTEIN: NEGATIVE mg/dL
Specific Gravity, Urine: 1.025 (ref 1.005–1.030)

## 2018-05-07 LAB — COMPREHENSIVE METABOLIC PANEL
ALT: 34 U/L (ref 0–44)
AST: 49 U/L — ABNORMAL HIGH (ref 15–41)
Albumin: 3.1 g/dL — ABNORMAL LOW (ref 3.5–5.0)
Alkaline Phosphatase: 94 U/L (ref 38–126)
Anion gap: 10 (ref 5–15)
BUN: 20 mg/dL (ref 8–23)
CHLORIDE: 111 mmol/L (ref 98–111)
CO2: 20 mmol/L — ABNORMAL LOW (ref 22–32)
Calcium: 8.8 mg/dL — ABNORMAL LOW (ref 8.9–10.3)
Creatinine, Ser: 1.47 mg/dL — ABNORMAL HIGH (ref 0.44–1.00)
GFR calc Af Amer: 39 mL/min — ABNORMAL LOW (ref >60)
GFR, EST NON AFRICAN AMERICAN: 34 mL/min — AB (ref >60)
Glucose, Bld: 173 mg/dL — ABNORMAL HIGH (ref 70–99)
Potassium: 3.9 mmol/L (ref 3.5–5.1)
Sodium: 141 mmol/L (ref 135–145)
Total Bilirubin: 0.6 mg/dL (ref 0.3–1.2)
Total Protein: 5.5 g/dL — ABNORMAL LOW (ref 6.5–8.1)

## 2018-05-07 LAB — TROPONIN I
TROPONIN I: 0.07 ng/mL — AB (ref <0.03)
TROPONIN I: 0.13 ng/mL — AB (ref <0.03)
Troponin I: 0.04 ng/mL (ref <0.03)

## 2018-05-07 LAB — RAPID URINE DRUG SCREEN, HOSP PERFORMED
Amphetamines: NOT DETECTED
BARBITURATES: NOT DETECTED
Benzodiazepines: NOT DETECTED
Cocaine: NOT DETECTED
OPIATES: NOT DETECTED
TETRAHYDROCANNABINOL: NOT DETECTED

## 2018-05-07 LAB — CBC
HCT: 33 % — ABNORMAL LOW (ref 36.0–46.0)
Hemoglobin: 10.7 g/dL — ABNORMAL LOW (ref 12.0–15.0)
MCH: 31.2 pg (ref 26.0–34.0)
MCHC: 32.4 g/dL (ref 30.0–36.0)
MCV: 96.2 fL (ref 78.0–100.0)
Platelets: 113 10*3/uL — ABNORMAL LOW (ref 150–400)
RBC: 3.43 MIL/uL — ABNORMAL LOW (ref 3.87–5.11)
RDW: 14.4 % (ref 11.5–15.5)
WBC: 5.6 10*3/uL (ref 4.0–10.5)

## 2018-05-07 LAB — ETHANOL: Alcohol, Ethyl (B): <10 mg/dL (ref <10)

## 2018-05-07 LAB — GLUCOSE, CAPILLARY: GLUCOSE-CAPILLARY: 137 mg/dL — AB (ref 70–99)

## 2018-05-07 LAB — PROTIME-INR
INR: 1.17
PROTHROMBIN TIME: 14.8 s (ref 11.4–15.2)

## 2018-05-07 LAB — PHENYTOIN LEVEL, TOTAL

## 2018-05-07 LAB — ECHOCARDIOGRAM COMPLETE

## 2018-05-07 LAB — APTT: aPTT: 30 seconds (ref 24–36)

## 2018-05-07 MED ORDER — CLOPIDOGREL BISULFATE 75 MG PO TABS
75.0000 mg | ORAL_TABLET | Freq: Every day | ORAL | Status: DC
  Administered 2018-05-08: 75 mg via ORAL
  Filled 2018-05-07: qty 1

## 2018-05-07 MED ORDER — STROKE: EARLY STAGES OF RECOVERY BOOK
Freq: Once | Status: AC
  Administered 2018-05-07: 16:00:00
  Filled 2018-05-07 (×2): qty 1

## 2018-05-07 MED ORDER — ACETAMINOPHEN 325 MG PO TABS: 650.0000 mg | ORAL_TABLET | ORAL | Status: DC | PRN

## 2018-05-07 MED ORDER — IOPAMIDOL (ISOVUE-370) INJECTION 76%
200.0000 mL | Freq: Once | INTRAVENOUS | Status: AC | PRN
  Administered 2018-05-07: 175 mL via INTRAVENOUS

## 2018-05-07 MED ORDER — LEVETIRACETAM 500 MG PO TABS
500.0000 mg | ORAL_TABLET | Freq: Two times a day (BID) | ORAL | Status: DC
  Administered 2018-05-07 – 2018-05-08 (×2): 500 mg via ORAL
  Filled 2018-05-07 (×2): qty 1

## 2018-05-07 MED ORDER — ACETAMINOPHEN 160 MG/5ML PO SOLN: 650.0000 mg | ORAL | Status: DC | PRN

## 2018-05-07 MED ORDER — HEPARIN SODIUM (PORCINE) 5000 UNIT/ML IJ SOLN
5000.0000 [IU] | Freq: Three times a day (TID) | INTRAMUSCULAR | Status: DC
  Administered 2018-05-07 – 2018-05-08 (×4): 5000 [IU] via SUBCUTANEOUS
  Filled 2018-05-07 (×4): qty 1

## 2018-05-07 MED ORDER — ACETAMINOPHEN 650 MG RE SUPP: 650.0000 mg | RECTAL | Status: DC | PRN

## 2018-05-07 MED ORDER — SODIUM CHLORIDE 0.9 % IV BOLUS
1000.0000 mL | Freq: Once | INTRAVENOUS | Status: AC
  Administered 2018-05-07: 1000 mL via INTRAVENOUS

## 2018-05-07 MED ORDER — TRAZODONE HCL 50 MG PO TABS
50.0000 mg | ORAL_TABLET | Freq: Once | ORAL | Status: AC
  Administered 2018-05-07: 50 mg via ORAL
  Filled 2018-05-07: qty 1

## 2018-05-07 MED ORDER — ASPIRIN 325 MG PO TABS
325.0000 mg | ORAL_TABLET | Freq: Every day | ORAL | Status: DC
  Administered 2018-05-07 – 2018-05-08 (×2): 325 mg via ORAL
  Filled 2018-05-07 (×2): qty 1

## 2018-05-07 MED ORDER — ZOLPIDEM TARTRATE 5 MG PO TABS: 5.0000 mg | ORAL_TABLET | Freq: Once | ORAL | Status: DC

## 2018-05-07 NOTE — Evaluation (Signed)
Clinical/Bedside Swallow Evaluation Patient Details  Name: [name] MRN: [id number] Date of Birth: 1941-06-21  Today's Date: 05/07/2018 Time: SLP Start Time (ACUTE ONLY): 1149 SLP Stop Time (ACUTE ONLY): 1204 SLP Time Calculation (min) (ACUTE ONLY): 15 min  Past Medical History:  Past Medical History:  Diagnosis Date  . Anemia   . Aortic insufficiency    Moderate  . Back pain   . Carotid stenosis, bilateral   . Cirrhosis ([place])   . Closed left femoral fracture ([place]) 04/21/2017  . Coronary artery disease    Stent x 2 RCA 1995, cardiac catheterization 09/2016 showing only mild atherosclerosis  . DDD (degenerative disc disease), lumbar   . Essential hypertension   . Falls   . GERD (gastroesophageal reflux disease)   . Hiatal hernia   . History of stroke   . Hypercholesteremia   . IBS (irritable bowel syndrome)   . Non-alcoholic fatty liver disease   . OSA (obstructive sleep apnea)    no CPAP  . Pericardial effusion    a. small by echo 2018.  Marland Kitchen PSVT (paroxysmal supraventricular tachycardia) ([place])   . Recurrent UTI   . Seizures ([place])   . Stroke ([place])   . Type 2 diabetes mellitus ([place])    off medication  . Varices, esophageal (HCC)    Past Surgical History:  Past Surgical History:  Procedure Laterality Date  . [place]  . APPENDECTOMY    . BACK SURGERY  1985  . CARDIAC CATHETERIZATION  [id number]   Stent to the proximal RCA after MI   . CARDIAC CATHETERIZATION N/A 09/26/2016   Procedure: Left Heart Cath and Coronary Angiography;  Surgeon: Leonie Man, MD;  Location: [facility] CV LAB;  Service: Cardiovascular;  Laterality: N/A;  . CATARACT EXTRACTION W/PHACO Right 07/04/2014   Procedure: CATARACT EXTRACTION PHACO AND INTRAOCULAR LENS PLACEMENT (IOC);  Surgeon: Elta Guadeloupe T. Gershon Crane, MD;  Location: [facility];  Service: Ophthalmology;  Laterality: Right;  CDE:13.13  . COLONOSCOPY    . DILATION AND CURETTAGE OF UTERUS     x2  . Epi Retinal Membrane  Peel Left   . ERCP    . ESOPHAGEAL BANDING N/A 04/01/2013   Procedure: ESOPHAGEAL BANDING;  Surgeon: Rogene Houston, MD;  Location: [facility];  Service: Endoscopy;  Laterality: N/A;  . ESOPHAGEAL BANDING N/A 05/24/2013   Procedure: ESOPHAGEAL BANDING;  Surgeon: Rogene Houston, MD;  [facility];  Service: Endoscopy;  Laterality: N/A;  . ESOPHAGEAL BANDING N/A 06/21/2014   Procedure: ESOPHAGEAL BANDING;  Surgeon: Rogene Houston, MD;  [facility];  Service: Endoscopy;  Laterality: N/A;  . ESOPHAGOGASTRODUODENOSCOPY N/A 04/01/2013   Procedure: ESOPHAGOGASTRODUODENOSCOPY (EGD);  Surgeon: Rogene Houston, MD;  [facility];  Service: Endoscopy;  Laterality: N/A;  230-rescheduled to 8:30am Ann notified pt  . ESOPHAGOGASTRODUODENOSCOPY N/A 05/24/2013   Procedure: ESOPHAGOGASTRODUODENOSCOPY (EGD);  Surgeon: Rogene Houston, MD;  [facility];  Service: Endoscopy;  Laterality: N/A;  730  . ESOPHAGOGASTRODUODENOSCOPY N/A 06/21/2014   Procedure: ESOPHAGOGASTRODUODENOSCOPY (EGD);  Surgeon: Rogene Houston, MD;  [facility];  Service: Endoscopy;  Laterality: N/A;  930-rescheduled 10/14 @ 1200 Ann to notify pt  . EYE SURGERY  08   cataract surgery of the left eye  . FEMUR IM NAIL Left 03/02/2017   Procedure: INTRAMEDULLARY (IM) NAIL FEMORAL;  Surgeon: Rod Can, MD;  Location: [facility];  Service: Orthopedics;  Laterality: Left;  . HARDWARE  REMOVAL Right 01/17/2013   Procedure: REMOVAL OF HARDWARE AND EXCISION ULNAR STYLOID RIGHT WRIST;  Surgeon: Tennis Must, MD;  Location: [place];  Service: Orthopedics;  Laterality: Right;  . MYRINGOTOMY  2012   both ears  . ORIF FEMUR FRACTURE Left 04/23/2017   Procedure: OPEN REDUCTION INTERNAL FIXATION (ORIF) DISTAL FEMUR FRACTURE (FRACTURE AROUND FEMORAL NAIL);  Surgeon: Altamese Byrnes Mill, MD;  Location: [facility];  Service: Orthopedics;  Laterality: Left;  . TONSILLECTOMY    . VAGINAL  HYSTERECTOMY  1972  . WRIST SURGERY     rt wrist hardwear removal   HPI:  [name] is a 77 y.o. female with medical history significant of anemia, aortic insufficiency, back pain/DDD, bilateral carotid stenosis, liver cirrhosis due to NASH, recent closed left femoral fracture, CAD, history of NSTEMI, recent stent placement, hypertension, frequent falls, GERD,/hiatal hernia, hyperlipidemia, IBS, OSA not on CPAP, pericardial effusion, history of paroxysmal SVT, recurrent UTIs, seizures on phenytoin, type 2 diabetes, recent ORIF for left femoral fracture, history of CVA who is coming to the emergency department due to slurred speech and left-sided weakness shortly after going to sleep at about 20-30 last night.  She mentioned that the weakness on the left side is different from what affected her on her previous CVA.  She denies headache, blurred vision, dizziness, tinnitus or numbness.  No fever, chills, sore throat, dyspnea, wheezing, hemoptysis, recent chest pain, palpitations, diaphoresis, PND or orthopnea.  She has had mild pitting edema of her LLE due to recent surgery.  Denies nausea, emesis, abdominal pain, diarrhea constipation, melena or hematochezia.  Denies dysuria, frequency or hematuria.  No heat or cold intolerance.  No polyuria, polydipsia, polyphagia.   Assessment / Plan / Recommendation Clinical Impression  Clinical swallowing evaluation was completed while pt was seated upright in the bed. Oral mechanism exam reveals mild L decreased facial ROM, mild labial droop and mildly decreased lingual speed and coordination. She consumed thin liquids via cup and straw, regular textures and puree textures; no overt s/sx were observed with any textures or consistencies presented. Recommend initiate regular diet with thin liquids. There are no dsyphagia needs noted at this time. Acknowledge SLE order. ST will complete SLE later today. SLP Visit Diagnosis: Dysphagia, unspecified (R13.10)     Aspiration Risk  Mild aspiration risk    Diet Recommendation Regular;Thin liquid   Liquid Administration via: Cup;Straw Medication Administration: Whole meds with liquid Supervision: Intermittent supervision to cue for compensatory strategies Compensations: Minimize environmental distractions;Slow rate;Small sips/bites Postural Changes: Seated upright at 90 degrees;Remain upright for at least 30 minutes after po intake    Other  Recommendations Oral Care Recommendations: Oral care BID     Swallow Study   General Date of Onset: 05/07/18 HPI: [name] is a 77 y.o. female with medical history significant of anemia, aortic insufficiency, back pain/DDD, bilateral carotid stenosis, liver cirrhosis due to NASH, recent closed left femoral fracture, CAD, history of NSTEMI, recent stent placement, hypertension, frequent falls, GERD,/hiatal hernia, hyperlipidemia, IBS, OSA not on CPAP, pericardial effusion, history of paroxysmal SVT, recurrent UTIs, seizures on phenytoin, type 2 diabetes, recent ORIF for left femoral fracture, history of CVA who is coming to the emergency department due to slurred speech and left-sided weakness shortly after going to sleep at about 20-30 last night.  She mentioned that the weakness on the left side is different from what affected her on her previous CVA.  She denies headache, blurred vision, dizziness, tinnitus or numbness.  No  fever, chills, sore throat, dyspnea, wheezing, hemoptysis, recent chest pain, palpitations, diaphoresis, PND or orthopnea.  She has had mild pitting edema of her LLE due to recent surgery.  Denies nausea, emesis, abdominal pain, diarrhea constipation, melena or hematochezia.  Denies dysuria, frequency or hematuria.  No heat or cold intolerance.  No polyuria, polydipsia, polyphagia. Type of Study: Bedside Swallow Evaluation Previous Swallow Assessment: BSE and SLE 02/2017 Diet Prior to this Study: NPO Temperature Spikes Noted: No Respiratory  Status: Room air History of Recent Intubation: No Behavior/Cognition: Alert;Cooperative;Pleasant mood Oral Cavity Assessment: Within Functional Limits Oral Care Completed by SLP: Recent completion by staff Oral Cavity - Dentition: Adequate natural dentition Vision: Functional for self-feeding Self-Feeding Abilities: Able to feed self Patient Positioning: Upright in bed Baseline Vocal Quality: Normal Volitional Cough: Strong Volitional Swallow: Able to elicit    Oral/Motor/Sensory Function Overall Oral Motor/Sensory Function: Mild impairment Facial ROM: Reduced left Facial Symmetry: Abnormal symmetry left Facial Strength: Reduced left Facial Sensation: Within Functional Limits Lingual ROM: Within Functional Limits Lingual Symmetry: Within Functional Limits   Ice Chips Ice chips: Within functional limits   Thin Liquid Thin Liquid: Within functional limits    Nectar Thick Nectar Thick Liquid: Not tested   Honey Thick Honey Thick Liquid: Not tested   Puree Puree: Within functional limits   Solid    [name], CCC-SLP Speech Language Pathologist  Solid: Within functional limits      [name] 05/07/2018,12:26 PM

## 2018-05-07 NOTE — ED Notes (Signed)
Tele neuro sign off 0246. No TpA outside of window.

## 2018-05-07 NOTE — Consult Note (Signed)
Barbara A. Barbara Laughter, MD     [email or web address]          [name] is an 77 y.o. female.   ASSESSMENT/PLAN: 1.  Acute left leg weakness/left hemiparesis and dysarthria: This is most likely due to recrudescence of remote infarct or post lesional Todd's paralysis.  The patient's Keppra will be increased.  She previously was taken off Dilantin. 2.  Tiny frontal infarct in the MCA distribution on the left.  This potentially could be a " symptomatic" left ICA issue given the stenosis of about 70% on the ipsilateral side.  Dual antiplatelet agents are recommended for the next month and the vascular consultation is also recommended.  3.  Post lesional seizure disorder previously controlled on low-dose Keppra       Patient is 77 year old white female who presented with the acute onset of dysarthria and left hemiparesis.  She does have a baseline history of mild leg weakness status post prior infarct.  She also has had a seizure thought to be post lesional seizure maintained on Keppra.  She previously was taken off Dilantin because of erratic doses either brain is supratherapeutic or subtherapeutic.  The patient reports some improvement in her left-sided symptoms since yesterday when she presented.  Dysarthria has improved.  The patient tells me that she did not have clear loss of consciousness or tonic-clonic activity.  She typically ambulates with a walker.  The review of systems otherwise negative.    GENERAL: This is a pleasant female in no acute distress.  HEENT: Normal  ABDOMEN: soft  EXTREMITIES: No edema   BACK: Normal  SKIN: Normal by inspection.    MENTAL STATUS: Alert and oriented -including her age and month. Speech, language and cognition are generally intact. Judgment and insight normal.   CRANIAL NERVES: Pupils are equal, round and reactive to light and accomodation; extra ocular movements are full, there is no significant nystagmus; visual fields  are full; there is mild flattening of the nasolabial fold on the left but other facial muscles are symmetric and normal; tongue is midline; uvula is midline; shoulder elevation is normal.  MOTOR: Normal tone, bulk and strength of the upper extremities; no pronator drift.  No drift of the legs but there is clear increased tone of the left lower extremity.  Hip flexion is 5/5 on both sides but left dorsiflexion is 0/5 and right 5.  COORDINATION: Left finger to nose is normal, right finger to nose is normal, No rest tremor; no intention tremor; no postural tremor; no bradykinesia.  REFLEXES: Deep tendon reflexes are symmetrical and normal. Plantar reflexes are flexor bilaterally.   SENSATION: Normal to light touch, temperature, and pain.    NIH stroke scale 1.      NEURO NOTE 02-2017 [name] a 77 y.o.femalewith a history of previous stroke with cortical encephalomalacia, bilateral carotid stenosis, coronary artery disease, hyperlipidemia, PSVT, diabetes mellitus, NAFLD, cirrhosis, and esophageal varices, who presented 02/28/17 with new onset seizures. She was discharged yesterday 02/27/17 with SVT and elevated troponin, then presented to [facility] emergency department with new onset seizures, which apparently were focal in the emergency department. She was loaded with dilantin.  CT head on 02/28/17 was negative for stroke.  MRI brain on 03/01/17 showed multifocal embolic punctate seizures in left precentral gyrus, left corona radiata, and right thalamus.  The patient was also found to have an acute left acute intertrochanteric femur fracture.  Patient was not administered IV t-PA secondary to being outside of  the treatment window. She was admitted to General Neurology for further evaluation and treatment.   EEG NORMAL 08-2017  NEURO NOTE 2015 1. Acute right MCA ischemic infarct. This is likely due to the cranial occlusive disease/atherosclerosis. Risk factors, obstructive sleep apnea  syndrome, diabetes, myocardial infarction and dyslipidemia. I would suggest that the patient be placed on dual antiplatelet agent for 3 months. Subsequent, she can be switched to Plavix. She does seem to have elevated LDL. I would suggest that the Lipitor be increased. I did go ahead and in and increases to 60 mg. She has an MRA of the brain which is pending.  2. Left extracranial ICA stenosis which is asymptomatic. Plan will be to manage this medically as above.  The patient is 77 year old white female who developed the acute onset of difficulty using her hands bilaterally at 11 PM last night. She was last known at that time but presented 12 hours later she was noted by her son to have facial asymmetry with a left facial weakness. She does not report having some dysarthria. The bilateral weakness of the hands seem to have resolved. The patient does not report headaches, dysphagia, Shortness of breath, chest pain, GI or GU symptoms. The patient reports that she has been compliant with aspirin and also her Lipitor. The review of systems otherwise negative.  Blood pressure (!) 160/54, pulse 85, temperature 98.7 F (37.1 C), temperature source Oral, resp. rate 20, SpO2 97 %.  Past Medical History:  Diagnosis Date  . Anemia   . Aortic insufficiency    Moderate  . Back pain   . Carotid stenosis, bilateral   . Cirrhosis ([place])   . Closed left femoral fracture ([place]) 04/21/2017  . Coronary artery disease    Stent x 2 RCA 1995, cardiac catheterization 09/2016 showing only mild atherosclerosis  . DDD (degenerative disc disease), lumbar   . Essential hypertension   . Falls   . GERD (gastroesophageal reflux disease)   . Hiatal hernia   . History of stroke   . Hypercholesteremia   . IBS (irritable bowel syndrome)   . Non-alcoholic fatty liver disease   . OSA (obstructive sleep apnea)    no CPAP  . Pericardial effusion    a. small by echo 2018.  Marland Kitchen PSVT (paroxysmal supraventricular tachycardia)  ([place])   . Recurrent UTI   . Seizures ([place])   . Stroke ([place])   . Type 2 diabetes mellitus ([place])    off medication  . Varices, esophageal ([place])     Past Surgical History:  Procedure Laterality Date  . [place]  . APPENDECTOMY    . BACK SURGERY  1985  . CARDIAC CATHETERIZATION  [phone]   Stent to the proximal RCA after MI   . CARDIAC CATHETERIZATION N/A 09/26/2016   Procedure: Left Heart Cath and Coronary Angiography;  Surgeon: Leonie Man, MD;  Location: [place] [facility];  Service: Cardiovascular;  Laterality: N/A;  . CATARACT EXTRACTION W/PHACO Right 07/04/2014   Procedure: CATARACT EXTRACTION PHACO AND INTRAOCULAR LENS PLACEMENT (IOC);  Surgeon: Elta Guadeloupe T. Gershon Crane, MD;  Location: [facility];  Service: Ophthalmology;  Laterality: Right;  CDE:13.13  . COLONOSCOPY    . DILATION AND CURETTAGE OF UTERUS     x2  . Epi Retinal Membrane Peel Left   . ERCP    . ESOPHAGEAL BANDING N/A 04/01/2013   Procedure: ESOPHAGEAL BANDING;  Surgeon: Rogene Houston, MD;  [facility];  Service: Endoscopy;  Laterality: N/A;  . ESOPHAGEAL BANDING N/A 05/24/2013   Procedure: ESOPHAGEAL BANDING;  Surgeon: Rogene Houston, MD;  [facility];  Service: Endoscopy;  Laterality: N/A;  . ESOPHAGEAL BANDING N/A 06/21/2014   Procedure: ESOPHAGEAL BANDING;  Surgeon: Rogene Houston, MD;  [facility];  Service: Endoscopy;  Laterality: N/A;  . ESOPHAGOGASTRODUODENOSCOPY N/A 04/01/2013   Procedure: ESOPHAGOGASTRODUODENOSCOPY (EGD);  Surgeon: Rogene Houston, MD;  [facility];  Service: Endoscopy;  Laterality: N/A;  230-rescheduled to 8:30am Ann notified pt  . ESOPHAGOGASTRODUODENOSCOPY N/A 05/24/2013   Procedure: ESOPHAGOGASTRODUODENOSCOPY (EGD);  Surgeon: Rogene Houston, MD;  [facility];  Service: Endoscopy;  Laterality: N/A;  730  . ESOPHAGOGASTRODUODENOSCOPY N/A 06/21/2014   Procedure: ESOPHAGOGASTRODUODENOSCOPY (EGD);  Surgeon: Rogene Houston, MD;  [facility];  Service: Endoscopy;  Laterality: N/A;  930-rescheduled 10/14 @ 1200 Ann to notify pt  . EYE SURGERY  08   cataract surgery of the left eye  . FEMUR IM NAIL Left 03/02/2017   Procedure: INTRAMEDULLARY (IM) NAIL FEMORAL;  Surgeon: Rod Can, MD;  Location: [place];  Service: Orthopedics;  Laterality: Left;  . HARDWARE REMOVAL Right 01/17/2013   Procedure: REMOVAL OF HARDWARE AND EXCISION ULNAR STYLOID RIGHT WRIST;  Surgeon: Tennis Must, MD;  Location: [place];  Service: Orthopedics;  Laterality: Right;  . MYRINGOTOMY  2012   both ears  . ORIF FEMUR FRACTURE Left 04/23/2017   Procedure: OPEN REDUCTION INTERNAL FIXATION (ORIF) DISTAL FEMUR FRACTURE (FRACTURE AROUND FEMORAL NAIL);  Surgeon: Altamese Cedar Ridge, MD;  Location: [place];  Service: Orthopedics;  Laterality: Left;  . TONSILLECTOMY    . VAGINAL HYSTERECTOMY  1972  . WRIST SURGERY     rt wrist hardwear removal    Family History  Problem Relation Age of Onset  . Diabetes Mother   . Heart failure Father   . Heart failure Maternal Aunt     Social History:  reports that she has been smoking cigarettes. She has a 12.50 pack-year smoking history. She has never used smokeless tobacco. She reports that she does not drink alcohol or use drugs.  Allergies:  Allergies  Allergen Reactions  . Tape Rash and Other (See Comments)    Please use paper tape    Medications: Prior to Admission medications   Medication Sig Start Date End Date Taking? Authorizing Provider  aspirin EC 81 MG tablet Take 81 mg by mouth daily.   Yes Jake Samples, PA-C  atorvastatin (LIPITOR) 40 MG tablet Take 40 mg by mouth daily.   Yes Jake Samples, PA-C  carvedilol (COREG) 12.5 MG tablet Take 1 tablet (12.5 mg total) by mouth 2 (two) times daily with a meal. Patient taking differently: Take 12.5 mg by mouth daily.  08/26/17  Yes Tat, Shanon Brow, MD  diltiazem (CARDIZEM CD) 120 MG 24 hr capsule Take 120  mg by mouth daily.   Yes Delman Cheadle J, PA-C  DiphenhydrAMINE HCl, Sleep, (SLEEP AID) 25 MG CAPS Take 1-2 capsules by mouth at bedtime as needed (for sleep).   Yes [provider]  furosemide (LASIX) 20 MG tablet Take 20 mg by mouth daily as needed.    Yes [provider]  hydrochlorothiazide (HYDRODIURIL) 25 MG tablet Take 1 tablet by mouth daily as needed.  04/22/18  Yes [provider]  isosorbide mononitrate (IMDUR) 30 MG 24 hr tablet Take 30 mg by mouth daily.    Yes Delman Cheadle  J, PA-C  levETIRAcetam (KEPPRA) 500 MG tablet Take 1 tablet by mouth daily. 04/07/18  Yes [provider]  lisinopril (PRINIVIL,ZESTRIL) 20 MG tablet Take 20 mg by mouth daily.   Yes Delman Cheadle J, PA-C  LORazepam (ATIVAN) 1 MG tablet Take 1 tablet by mouth daily as needed. 04/14/18  Yes [provider]  omeprazole (PRILOSEC) 20 MG capsule Take 1 capsule (20 mg total) by mouth daily. 03/05/18  Yes Setzer, Rona Ravens, NP  ondansetron (ZOFRAN) 4 MG tablet Take 1 tablet (4 mg total) by mouth every 8 (eight) hours as needed for nausea or vomiting. 01/26/18  Yes Setzer, Terri L, NP  potassium chloride (MICRO-K) 10 MEQ CR capsule Take 20 mEq by mouth daily.    Yes [provider]  ranitidine (ZANTAC) 150 MG tablet Take 1 tablet by mouth 2 (two) times daily as needed. 03/19/18  Yes [provider]  meloxicam (MOBIC) 7.5 MG tablet Take 7.5 mg by mouth daily.    [provider]  pantoprazole (PROTONIX) 40 MG tablet Take 1 tablet by mouth daily.    [provider]    Scheduled Meds: . heparin  5,000 Units Subcutaneous Q8H   Continuous Infusions: PRN Meds:.acetaminophen **OR** acetaminophen (TYLENOL) oral liquid 160 mg/5 mL **OR** acetaminophen     Results for orders placed or performed during the hospital encounter of 05/07/18 (from the past 48 hour(s))  CBG monitoring, ED     Status: Abnormal   Collection Time: 05/07/18 12:30 AM    Result Value Ref Range   Glucose-Capillary 173 (H) 70 - 99 mg/dL  Ethanol     Status: None   Collection Time: 05/07/18 12:33 AM  Result Value Ref Range   Alcohol, Ethyl (B) <10 <10 mg/dL    Comment: (NOTE) Lowest detectable limit for serum alcohol is 10 mg/dL. For medical purposes only. Performed at [facility], [address], [place], [place] [address]   Protime-INR     Status: None   Collection Time: 05/07/18 12:33 AM  Result Value Ref Range   Prothrombin Time 14.8 11.4 - 15.2 seconds   INR 1.17     Comment: Performed at [facility], [address] [place], [place] [address]  APTT     Status: None   Collection Time: 05/07/18 12:33 AM  Result Value Ref Range   aPTT 30 24 - 36 seconds    Comment: Performed at [facility], [address] [place], [place] [address]  CBC     Status: Abnormal   Collection Time: 05/07/18 12:33 AM  Result Value Ref Range   WBC 5.6 4.0 - 10.5 K/uL   RBC 3.43 (L) 3.87 - 5.11 MIL/uL   Hemoglobin 10.7 (L) 12.0 - 15.0 g/dL   HCT 33.0 (L) 36.0 - 46.0 %   MCV 96.2 78.0 - 100.0 fL   MCH 31.2 26.0 - 34.0 pg   MCHC 32.4 30.0 - 36.0 g/dL   RDW 14.4 11.5 - 15.5 %   Platelets 113 (L) 150 - 400 K/uL    Comment: SPECIMEN CHECKED FOR CLOTS CONSISTENT WITH PREVIOUS RESULT Performed at [facility], [address], [place], [place] [address]   Differential     Status: None   Collection Time: 05/07/18 12:33 AM  Result Value Ref Range   Neutrophils Relative % 54 %   Neutro Abs 3.0 1.7 - 7.7 K/uL   Lymphocytes Relative 33 %   Lymphs Abs 1.8 0.7 - 4.0 K/uL   Monocytes Relative 9 %  Monocytes Absolute 0.5 0.1 - 1.0 K/uL   Eosinophils Relative 4 %   Eosinophils Absolute 0.2 0.0 - 0.7 K/uL   Basophils Relative 0 %   Basophils Absolute 0.0 0.0 - 0.1 K/uL    Comment: Performed at [facility], [address], [place], [place] [address]  Comprehensive metabolic panel     Status: Abnormal   Collection Time: 05/07/18 12:33 AM  Result Value Ref Range   Sodium  141 135 - 145 mmol/L   Potassium 3.9 3.5 - 5.1 mmol/L   Chloride 111 98 - 111 mmol/L   CO2 20 (L) 22 - 32 mmol/L   Glucose, Bld 173 (H) 70 - 99 mg/dL   BUN 20 8 - 23 mg/dL   Creatinine, Ser 1.47 (H) 0.44 - 1.00 mg/dL   Calcium 8.8 (L) 8.9 - 10.3 mg/dL   Total Protein 5.5 (L) 6.5 - 8.1 g/dL   Albumin 3.1 (L) 3.5 - 5.0 g/dL   AST 49 (H) 15 - 41 U/L   ALT 34 0 - 44 U/L   Alkaline Phosphatase 94 38 - 126 U/L   Total Bilirubin 0.6 0.3 - 1.2 mg/dL   GFR calc non Af Amer 34 (L) >60 mL/min   GFR calc Af Amer 39 (L) >60 mL/min    Comment: (NOTE) The eGFR has been calculated using the CKD EPI equation. This calculation has not been validated in all clinical situations. eGFR's persistently <60 mL/min signify possible Chronic Kidney Disease.    Anion gap 10 5 - 15    Comment: Performed at [facility], [address], [place], [place] [address]  Troponin I     Status: Abnormal   Collection Time: 05/07/18 12:33 AM  Result Value Ref Range   Troponin I 0.04 (HH) <0.03 ng/mL    Comment: CRITICAL RESULT CALLED TO, READ BACK BY AND VERIFIED WITH: NORMAN, B. AT 0145 ON 05/07/2018 BY EVA Performed at [facility], [address], [place], [place] [address]   Urine rapid drug screen (hosp performed)     Status: None   Collection Time: 05/07/18  2:52 AM  Result Value Ref Range   Opiates NONE DETECTED NONE DETECTED   Cocaine NONE DETECTED NONE DETECTED   Benzodiazepines NONE DETECTED NONE DETECTED   Amphetamines NONE DETECTED NONE DETECTED   Tetrahydrocannabinol NONE DETECTED NONE DETECTED   Barbiturates NONE DETECTED NONE DETECTED    Comment: (NOTE) DRUG SCREEN FOR MEDICAL PURPOSES ONLY.  IF CONFIRMATION IS NEEDED FOR ANY PURPOSE, NOTIFY LAB WITHIN 5 DAYS. LOWEST DETECTABLE LIMITS FOR URINE DRUG SCREEN Drug Class                     Cutoff (ng/mL) Amphetamine and metabolites    1000 Barbiturate and metabolites    200 Benzodiazepine                 867 Tricyclics and metabolites      300 Opiates and metabolites        300 Cocaine and metabolites        300 THC                            50 Performed at [facility], [address] [place], [place] [address]   Urinalysis, Routine w reflex microscopic     Status: None   Collection Time: 05/07/18  2:52 AM  Result Value Ref Range   Color, Urine YELLOW YELLOW  APPearance CLEAR CLEAR   Specific Gravity, Urine 1.025 1.005 - 1.030   pH 5.0 5.0 - 8.0   Glucose, UA NEGATIVE NEGATIVE mg/dL   Hgb urine dipstick NEGATIVE NEGATIVE   Bilirubin Urine NEGATIVE NEGATIVE   Ketones, ur NEGATIVE NEGATIVE mg/dL   Protein, ur NEGATIVE NEGATIVE mg/dL   Nitrite NEGATIVE NEGATIVE   Leukocytes, UA NEGATIVE NEGATIVE    Comment: Performed at [facility], [address] [place], [place] [address]  Troponin I     Status: Abnormal   Collection Time: 05/07/18  7:46 AM  Result Value Ref Range   Troponin I 0.07 (HH) <0.03 ng/mL    Comment: CRITICAL RESULT CALLED TO, READ BACK BY AND VERIFIED WITH: BULLINS,L AT 8:25AM ON 05/07/18 BY [facility] Performed at [facility], [address] [place], [place] [address]   Phenytoin level, total     Status: Abnormal   Collection Time: 05/07/18  7:46 AM  Result Value Ref Range   Phenytoin Lvl <2.5 (L) 10.0 - 20.0 ug/mL    Comment: Performed at [facility], [address], [place], [place] [address]  Troponin I     Status: Abnormal   Collection Time: 05/07/18  1:10 PM  Result Value Ref Range   Troponin I 0.13 (HH) <0.03 ng/mL    Comment: CRITICAL RESULT CALLED TO, READ BACK BY AND VERIFIED WITH: FOLEY B. AT 1407 ON [id number] BY THOMPSON S. Performed at [facility], [address] [place], [place] [address]     Studies/Results:  BRAIN MRI FINDINGS: Brain: Punctate left peri rolandic, superior motor strip region, cortex focus of restricted diffusion (series 4, image 89) with faint T2 and FLAIR hyperintensity. No hemorrhage or mass effect.  No other restricted diffusion. Chronic right MCA,  operculum infarct with encephalomalacia is stable since 2018. Occasional scattered small cerebral white matter T2 and FLAIR hyperintense foci elsewhere stable. Several small chronic infarcts in the right cerebellum are stable. Chronic left thalamic and basal ganglia lacunar infarcts re-demonstrated. No hemosiderin.  No midline shift, mass effect, evidence of mass lesion, ventriculomegaly, extra-axial collection or acute intracranial hemorrhage. Cervicomedullary junction and pituitary are within normal limits.  Vascular: Major intracranial vascular flow voids are stable since 2018.  Skull and upper cervical spine: Stable visible cervical spine. Visualized bone marrow signal is within normal limits.  Sinuses/Orbits: Stable and negative.  Other: Mild right mastoid effusion has regressed since 2018. Trace left mastoid fluid is stable. Visible internal auditory structures appear normal. Negative scalp and face soft tissues.  IMPRESSION: 1. Punctate acute lacunar infarct in the superior left peri rolandic cortex with no hemorrhage or mass effect. If symptomatic, right side weakness would be expected. 2. Otherwise stable chronic ischemic disease since 2018.   BRAIN MRA IMPRESSION: 1. Suggestion of new irregularity of the left MCA posterior M2 and M3 branches with attenuated flow, however, given that these same branches appeared normal on the CTA 0209 hours today, and that multiple branches are seemingly affected, I favor this is artifact. 2. Otherwise stable intracranial MRA since 2017: - remaining anterior circulation is negative. - chronic moderate to severe right MCA P1/P2 junction stenosis.   CTA HEAD NECK FINDINGS: CTA NECK FINDINGS  Aortic arch: Standard branching. Imaged portion shows no evidence of aneurysm or dissection. No high-grade stenosis of the major arch vessel origins. Mild left subclavian artery origin stenosis with calcified plaque. Mild calcific  atherosclerosis.  Right carotid system: No evidence of dissection, stenosis (50% or greater) or occlusion. Mild calcific atherosclerosis.  Left carotid system: Severe greater than 70% stenosis of the left carotid bifurcation with dense calcified plaque. No dissection, occlusion, or additional segment of stenosis.  Vertebral arteries: Codominant. No evidence of dissection, stenosis (50% or greater) or occlusion. Mild calcific atherosclerosis.  Skeleton: C3-4 grade 1 retrolisthesis. C4-5 grade 1 anterolisthesis. Severe discogenic degenerative changes at the C3-4 and C5-C7 levels. Prominent upper cervical facet arthropathy. No high-grade bony canal stenosis.  Other neck: 10 mm nodule in the left lobe of the thyroid.  Upper chest: Please refer to the concurrent CT angiogram of the chest.  Review of the MIP images confirms the above findings  CTA HEAD FINDINGS  Anterior circulation: No significant stenosis, proximal occlusion, aneurysm, or vascular malformation. Calcified plaque of carotid siphons with mild less than 50% bilateral paraclinoid stenosis.  Posterior circulation: Left vertebral artery calcified plaque with mild less than 50% stenosis. Stable moderate to severe right P2 stenosis. No large vessel occlusion, aneurysm, or additional segment of high-grade stenosis.  Venous sinuses: As permitted by contrast timing, patent.  Anatomic variants: None significant.  Review of the MIP images confirms the above findings  CT Brain Perfusion Findings:  CBF (<30%) Volume: 58m  Perfusion (Tmax>6.0s) volume: 04101m Mismatch Volume: 101m45mInfarction Location:Not applicable.  IMPRESSION: 1. Normal CT brain perfusion. 2. No large vessel occlusion, aneurysm, or vascular malformation. 3. Stable severe left proximal ICA stenosis with calcified plaque. 4. Stable mild left subclavian artery origin stenosis with calcified plaque. 5. Stable right P2 segment  moderate to severe stenosis. 6. Stable left vertebral artery mild stenosis. 7. Stable bilateral paraclinoid ICA mild stenosis. 8. Stable advanced cervical spondylosis with severe discogenic degenerative changes at the C3 through C7 levels. 9. Calcific atherosclerosis of aorta, carotid systems, and vertebral Arteries.       Cortnee Steinmiller A. DooMerlene [name].D.  Diplomate, [employer] adviser Psychiatry and Neurology ( Neurology). 05/07/2018, 3:36 PM

## 2018-05-07 NOTE — ED Notes (Signed)
Left facial droop and left leg sensation  mildly improved since prior assesment

## 2018-05-07 NOTE — ED Triage Notes (Addendum)
Pt brought in by [facility] for slurred speech and left side weakness.   Pt is awake and alert, states she went to bed at 10 pm, awoke approx 2230 with slurring of speech and left side weakness that is different from weakness that affected her from previous stroke 2-3 years ago.

## 2018-05-07 NOTE — Progress Notes (Signed)
Code stroke  ER Called  [place] In CT   1213 Out of CT  [place] [facility]  1221

## 2018-05-07 NOTE — ED Triage Notes (Signed)
Pt in by [facility] for Code stroke.  Pt lkw 1 hour ago

## 2018-05-07 NOTE — Evaluation (Signed)
Physical Therapy Evaluation Patient Details Name: [name] MRN: [id number] DOB: 08-May-1941 Today's Date: 05/07/2018   History of Present Illness  [name] is a 77 y.o. female with medical history significant of anemia, aortic insufficiency, back pain/DDD, bilateral carotid stenosis, liver cirrhosis due to NASH, recent closed left femoral fracture, CAD, history of NSTEMI, recent stent placement, hypertension, frequent falls, GERD,/hiatal hernia, hyperlipidemia, IBS, OSA not on CPAP, pericardial effusion, history of paroxysmal SVT, recurrent UTIs, seizures on phenytoin, type 2 diabetes, recent ORIF for left femoral fracture, history of CVA who is coming to the emergency department due to slurred speech and left-sided weakness shortly after going to sleep at about 20-30 last night.  She mentioned that the weakness on the left side is different from what affected her on her previous CVA.  She denies headache, blurred vision, dizziness, tinnitus or numbness.  No fever, chills, sore throat, dyspnea, wheezing, hemoptysis, recent chest pain, palpitations, diaphoresis, PND or orthopnea.  She has had mild pitting edema of her LLE due to recent surgery.  Denies nausea, emesis, abdominal pain, diarrhea constipation, melena or hematochezia.  Denies dysuria, frequency or hematuria.  No heat or cold intolerance.  No polyuria, polydipsia, polyphagia.    Clinical Impression  Patient demonstrates slightly labored movement for sitting up, transfers and ambulation, slightly weaker on LLE vs right, but may be weak due left hip fracture with surgery early in year and presents with contracture to left heel cords limiting left dorsiflexion.  Patient able to ambulate just out side of room without loss of balance use RW, but limited mostly due to c/o fatigue and tolerated sitting up in chair after therapy.  Overall patient is close to baseline and states her family will be able to help 24/7 if needed.  Patient will benefit  from continued physical therapy in hospital and recommended venue below to increase strength, balance, endurance for safe ADLs and gait.    Follow Up Recommendations Home health PT;Supervision/Assistance - 24 hour    Equipment Recommendations  None recommended by PT    Recommendations for Other Services       Precautions / Restrictions Precautions Precautions: Fall Restrictions Weight Bearing Restrictions: No      Mobility  Bed Mobility Overal bed mobility: Needs Assistance Bed Mobility: Supine to Sit     Supine to sit: Supervision     General bed mobility comments: slightly labored movement  Transfers Overall transfer level: Needs assistance Equipment used: Rolling walker (2 wheeled) Transfers: Sit to/from [facility] Sit to Stand: Min guard Stand pivot transfers: Min guard       General transfer comment: slow labored movement  Ambulation/Gait Ambulation/Gait assistance: Min guard Gait Distance (Feet): 25 Feet Assistive device: Rolling walker (2 wheeled) Gait Pattern/deviations: Decreased step length - right;Decreased step length - left;Decreased stance time - left;Decreased stride length Gait velocity: decreased   General Gait Details: slow slightly labored cadence with decreased left ankle dorsiflexion, no loss of balance, limited secondary to fatigue  Stairs            Wheelchair Mobility    Modified Rankin (Stroke Patients Only)       Balance Overall balance assessment: Needs assistance Sitting-balance support: Feet supported;No upper extremity supported Sitting balance-Leahy Scale: Good     Standing balance support: Bilateral upper extremity supported;During functional activity Standing balance-Leahy Scale: Fair  Pertinent Vitals/Pain Pain Assessment: 0-10 Pain Score: 4  Pain Location: numbness left foot Pain Descriptors / Indicators: Numbness Pain Intervention(s): Limited  activity within patient's tolerance;Monitored during session    [place] expects to be discharged to:: Private residence Living Arrangements: Alone(graddaughter stays with her at night) Available Help at Discharge: Family(son lives across the street) Type of Home: House Home Access: Stairs to enter Entrance Stairs-Rails: Left Entrance Stairs-Number of Steps: 2 Home Layout: Multi-level;Two level Home Equipment: Walker - 2 wheels;Wheelchair - manual;Cane - single point;Shower seat;Bedside commode      Prior Function Level of Independence: Independent with assistive device(s)         Comments: household ambulator with RW, uses wheelchair for community distances     Hand Dominance   Dominant Hand: Right    Extremity/Trunk Assessment   Upper Extremity Assessment Upper Extremity Assessment: Defer to OT evaluation    Lower Extremity Assessment Lower Extremity Assessment: RLE deficits/detail;Generalized weakness;LLE deficits/detail RLE Deficits / Details: RLE grossly 4+/5 LLE Deficits / Details: grossly 3+/5 except limited dorsiflexion left ankle -20 extension more likely due to tight heelcords, patient was using special boot for LLE at home    Cervical / Trunk Assessment Cervical / Trunk Assessment: Normal  Communication   Communication: No difficulties  Cognition Arousal/Alertness: Awake/alert Behavior During Therapy: WFL for tasks assessed/performed Overall Cognitive Status: Within Functional Limits for tasks assessed                                        General Comments      Exercises     Assessment/Plan    PT Assessment Patient needs continued PT services  PT Problem List Decreased strength;Decreased activity tolerance;Decreased balance;Decreased range of motion;Decreased mobility(decreased ROM left ankle)       PT Treatment Interventions Gait training;Stair training;Functional mobility training;Therapeutic  activities;Therapeutic exercise;Patient/family education    PT Goals (Current goals can be found in the Care Plan section)  Acute Rehab PT Goals Patient Stated Goal: return home with family to assist PT Goal Formulation: With patient Time For Goal Achievement: 05/14/18 Potential to Achieve Goals: Good    Frequency 7X/week   Barriers to discharge        Co-evaluation               AM-PAC PT "6 Clicks" Daily Activity  Outcome Measure Difficulty turning over in bed (including adjusting bedclothes, sheets and blankets)?: None Difficulty moving from lying on back to sitting on the side of the bed? : None Difficulty sitting down on and standing up from a chair with arms (e.g., wheelchair, bedside commode, etc,.)?: A Little Help needed moving to and from a bed to chair (including a wheelchair)?: A Little Help needed walking in hospital room?: A Little Help needed climbing 3-5 steps with a railing? : A Lot 6 Click Score: 19    End of Session   Activity Tolerance: Patient tolerated treatment well;Patient limited by fatigue Patient left: in chair;with call bell/phone within reach Nurse Communication: Mobility status;Other (comment)(RN aware patient left up in chair) PT Visit Diagnosis: Unsteadiness on feet (R26.81);Other abnormalities of gait and mobility (R26.89);Muscle weakness (generalized) (M62.81)    Time: 5809-9833 PT Time Calculation (min) (ACUTE ONLY): 31 min   Charges:   PT Evaluation $PT Eval Moderate Complexity: 1 Mod PT Treatments $Therapeutic Activity: 23-37 mins        2:00 PM, 05/07/18  Lonell Grandchild, MPT Physical Therapist with [facility] [phone] office [phone] mobile phone

## 2018-05-07 NOTE — Progress Notes (Signed)
*  PRELIMINARY RESULTS* Echocardiogram 2D Echocardiogram has been performed.  Leavy Cella 05/07/2018, 1:17 PM

## 2018-05-07 NOTE — ED Notes (Signed)
Called CT to notify of orders   Placed.

## 2018-05-07 NOTE — ED Notes (Signed)
Pt to CT with RN

## 2018-05-07 NOTE — Care Management (Signed)
Admitted with CVA. Pt from home, ind pta. Pt active with HH through [facility]. Plans to resume services at DC. She is aware HH has 48 hrs to make resumption visit. Home Health rep aware of admission and will pull resumption orders from chart. Anticipate DC home in next 24 hrs.

## 2018-05-07 NOTE — ED Notes (Signed)
CODE STROKE PAGED @ [phone]

## 2018-05-07 NOTE — Plan of Care (Signed)
  Problem: Acute Rehab PT Goals(only PT should resolve) Goal: Pt Will Go Supine/Side To Sit Outcome: Progressing Flowsheets (Taken 05/07/2018 1402) Pt will go Supine/Side to Sit: with modified independence Goal: Patient Will Transfer Sit To/From Stand Outcome: Progressing Flowsheets (Taken 05/07/2018 1402) Patient will transfer sit to/from stand: with supervision Goal: Pt Will Transfer Bed To Chair/Chair To Bed Outcome: Progressing Flowsheets (Taken 05/07/2018 1402) Pt will Transfer Bed to Chair/Chair to Bed: with supervision Goal: Pt Will Ambulate Outcome: Progressing Flowsheets (Taken 05/07/2018 1402) Pt will Ambulate: with rolling walker; with supervision   2:03 PM, 05/07/18 Lonell Grandchild, MPT Physical Therapist with [facility] [phone] office [phone] mobile phone

## 2018-05-07 NOTE — Consult Note (Addendum)
CTA C/A/P IMPRESSION: 1. No acute intrathoracic, abdominal, or pelvic pathology. No aortic aneurysm or dissection. A 2 cm infrarenal aortic ectasia. 2. Cirrhosis with evidence of portal hypertension, ascites, and upper abdominal varices. 3. Thickened appearance of the distal esophagus, similar to prior CT, and likely related to cirrhosis and esophageal varices. 4.  Aortic Atherosclerosis (ICD10-I70.0). 5. Additional nonacute and nonemergent findings as described above. cta h&n&p IMPRESSION: 1. Normal CT brain perfusion. 2. No large vessel occlusion, aneurysm, or vascular malformation. 3. Stable severe left proximal ICA stenosis with calcified plaque. 4. Stable mild left subclavian artery origin stenosis with calcified plaque. 5. Stable right P2 segment moderate to severe stenosis. 6. Stable left vertebral artery mild stenosis. 7. Stable bilateral paraclinoid ICA mild stenosis. 8. Stable advanced cervical spondylosis with severe discogenic degenerative changes at the C3 through C7 levels. 9. Calcific atherosclerosis of aorta, carotid systems, and vertebral Arteries.  No LVO.  No aortic dissection.  Likely worsening of old stroke finding in the setting of primary medical illness. D/w ed attg.    No mismatch on CTP.  Pt not candidate for NIR. CTAs still pending  Patient still at [place]. Past hx of hematemesis, cirrhosis and platelet 113 and ptt 14.8 noted.  CD L ICA 50-69% anad R ICA <50% from 8/15 also noted.  Pt now outside of window for TPA. Will f/u CTAs r/o aortic dissection and LVO.     Date:05/07/18 [facility] Tele[facility]urology Consult Services  Impression:  r/o new stroke vs worsening of old stroke findings in the setting of systemic illness; r/o aortic dissection    Not a tpa candidate due to:  hypotension presentation may be consistent w aortic dissection - need to rule out AD prior to tpa decision Not an NIR candidate due to: CTA H and N and P recommended r/o lvo;  r/o ischemic penumbra    Differential Diagnosis:   1. Cardioembolic stroke  2. Small vessel disease/lacune  3. Thromboembolic, artery-to-artery mechanism  4. Hypercoagulable state-related infarct  5. Transient ischemic attack  6. Thrombotic mechanism, large artery disease     Comments:   Last known well:2200 Door time:0016 [employer] contacted:00:30 [employer] at bedside: call back 00:38; actual log in 00:44 NIHSS assessment time:00:44  Recommendations:  CT chest w/w/o contrast r/o aortic dissection (pt presenting w worsening L sided weakness and hypotension) CTA H and N w/w/o contrast r/o proximal LVO; ct perfusion r/o ischemic penumbra (if available at this institution) inpatient neurology consultation inpatient stroke evaluation as per Neurology/Internal Medicine Discussed with ED MD  -----------------------------------------------------------------------------------------  CC  stroke alert  History of Present Illness  77yo W w pmh of htn, dm, cad, stroke 2-28yr ago w residual L HP, hld on asa which she takes inconsistently last known nl at 22:00 before falling asleep who woke upa t 10:30 w slurred speech and worsened L HP. No chest pain. Hypotensionve 69/33. FS 173.  DIAGNOSTICS: hct  IMPRESSION: 1. No acute intracranial abnormality identified. 2. Stable chronic infarction of the right lateral frontal lobe and superior insula. Stable multiple small chronic infarctions in the basal ganglia and right inferior cerebellum. Stable mild volume loss of the brain.     Exam:  NIHSS score: 5  Level of consciousness:0 LOC questions:0 LOC commands:0 Best Gaze:0 Visual:1 ( LUQ unclear chronicity) Facial palsy:1 (chronic) Motor arm - left:0 Motor arm - right:0 Motor leg - left:0 Motor leg - right:1 Limb ataxia:0 Sensory:1 ( leftleg unclear chronicity) Best language:0 Dysarthria:1 Extinction and inattention:0    Medical  Decision Making:  - Extensive  number of diagnosis or management options are considered above.   - Extensive amount of complex data reviewed.   - High risk of complication and/or morbidity or mortality are associated with differential diagnostic considerations above.  - There may be Uncertain outcome and increased probability of prolonged functional impairment or high probability of severe prolonged functional impairment associated with some of these differential diagnosis.  Medical Data Reviewed:  1.Data reviewed include clinical labs, radiology,  Medical Tests;   2.Tests results discussed w/performing or interpreting physician;   3.Obtaining/reviewing old medical records;  4.Obtaining case history from another source;  5.Independent review of image, tracing or specimen.    Patient was informed the Neurology Consult would happen via TeleHealth consult by way of interactive audio and video telecommunications and consented to receiving care in this manner.

## 2018-05-07 NOTE — Progress Notes (Signed)
Patient admitted to the hospital by Dr. Olevia Bowens, earlier this morning.  Patient seen and examined. Speech has improved. Still feels weak in left leg. On examination, strength is equal bilaterally  She has been admitted to the hospital with transient left sided weakness and slurred speech. Found to have acute left sided infarct. Seen by neurology and recommendations are for dual antiplatelet therapy for the next month. She has known left ICA stenosis of 70%. Will need to discuss with vascular regarding further follow up. Echo is unremarkable. She does have a seizure disorder and is on keppra. 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 Anticipate discharge in next 24 hours.  [employer]

## 2018-05-07 NOTE — Evaluation (Signed)
Speech Language Pathology Evaluation Patient Details Name: [name] MRN: [id number] DOB: [birth year] Today's Date: 05/07/2018 Time: 7824-2353 SLP Time Calculation (min) (ACUTE ONLY): 38 min  Problem List:  Patient Active Problem List   Diagnosis Date Noted  . Acute CVA (cerebrovascular accident) ([place]) 05/07/2018  . Syncope and collapse   . Syncope 08/24/2017  . Closed left femoral fracture ([place]) 04/21/2017  . Seizure disorder ([place]) 04/21/2017  . Closed pertrochanteric fracture of femur, left, initial encounter ([place]) 03/02/2017  . Seizure ([place]) 02/28/2017  . Hip fracture ([place]) 02/28/2017  . Chest pain 12/07/2016  . Atypical chest pain 12/07/2016  . Tachycardia   . NSTEMI (non-ST elevated myocardial infarction) ([place]) 11/02/2016  . Multifocal atrial tachycardia ([place]) 09/25/2016  . Elevated troponin 09/24/2016  . Hypokalemia 09/23/2016  . TIA (transient ischemic attack) 05/05/2016  . Intractable nausea and vomiting 10/15/2015  . Acute coronary syndrome ([place]) 06/01/2015  . Bronchitis 06/01/2015  . Thrombocytopenia ([place]) 06/01/2015  . Cerebral infarction ([place]) 03/06/2014  . Lower urinary tract infectious disease   . PSVT (paroxysmal supraventricular tachycardia) ([place]) 12/09/2013  . Esophageal varices ([place]) 03/29/2013  . Hematemesis 03/29/2013  . Hepatic cirrhosis ([place]) 03/29/2013  . Presence of stent in right coronary artery 07/04/2011  . OSA (obstructive sleep apnea) 07/04/2011  . Aortic sclerosis 07/04/2011  . Aortic insufficiency 07/04/2011  . HTN (hypertension) 07/04/2011  . Carotid stenosis, bilateral 07/04/2011  . Chest pain at rest 07/03/2011  . CAD (coronary artery disease) 07/03/2011  . DM type 2 (diabetes mellitus, type 2) ([place]) 07/03/2011  . Hypercholesteremia 07/03/2011  . GERD (gastroesophageal reflux disease) 07/03/2011   Past Medical History:  Past Medical History:  Diagnosis Date  . Anemia   . Aortic insufficiency    Moderate  . Back pain   .  Carotid stenosis, bilateral   . Cirrhosis ([place])   . Closed left femoral fracture ([place]) 04/21/2017  . Coronary artery disease    Stent x 2 RCA 1995, cardiac catheterization 09/2016 showing only mild atherosclerosis  . DDD (degenerative disc disease), lumbar   . Essential hypertension   . Falls   . GERD (gastroesophageal reflux disease)   . Hiatal hernia   . History of stroke   . Hypercholesteremia   . IBS (irritable bowel syndrome)   . Non-alcoholic fatty liver disease   . OSA (obstructive sleep apnea)    no CPAP  . Pericardial effusion    a. small by echo 2018.  Marland Kitchen PSVT (paroxysmal supraventricular tachycardia) ([address])   . Recurrent UTI   . Seizures ([place])   . Stroke ([place])   . Type 2 diabetes mellitus ([place])    off medication  . Varices, esophageal (H[facility])    Past Surgical History:  Past Surgical History:  Procedure Laterality Date  . [place]  . APPENDECTOMY    . BACK SURGERY  1985  . CARDIAC CATHETERIZATION  [phone]   Stent to the proximal RCA after MI   . CARDIAC CATHETERIZATION N/A 09/26/2016   Procedure: Left Heart Cath and Coronary Angiography;  Surgeon: Leonie Man, MD;  Location: [facility];  Service: Cardiovascular;  Laterality: N/A;  . CATARACT EXTRACTION W/PHACO Right 07/04/2014   Procedure: CATARACT EXTRACTION PHACO AND INTRAOCULAR LENS PLACEMENT (IOC);  Surgeon: Elta Guadeloupe T. Gershon Crane, MD;  Location: [facility];  Service: Ophthalmology;  Laterality: Right;  CDE:13.13  . COLONOSCOPY    . DILATION AND CURETTAGE OF UTERUS     x2  . Epi Retinal Membrane  Peel Left   . ERCP    . ESOPHAGEAL BANDING N/A 04/01/2013   Procedure: ESOPHAGEAL BANDING;  Surgeon: Rogene Houston, MD;  [facility]: [facility];  Service: Endoscopy;  Laterality: N/A;  . ESOPHAGEAL BANDING N/A 05/24/2013   Procedure: ESOPHAGEAL BANDING;  Surgeon: Rogene Houston, MD;  [facility];  Service: Endoscopy;  Laterality: N/A;  . ESOPHAGEAL BANDING N/A 06/21/2014    Procedure: ESOPHAGEAL BANDING;  Surgeon: Rogene Houston, MD;  [facility];  Service: Endoscopy;  Laterality: N/A;  . ESOPHAGOGASTRODUODENOSCOPY N/A 04/01/2013   Procedure: ESOPHAGOGASTRODUODENOSCOPY (EGD);  Surgeon: Rogene Houston, MD;  [facility];  Service: Endoscopy;  Laterality: N/A;  230-rescheduled to 8:30am Ann notified pt  . ESOPHAGOGASTRODUODENOSCOPY N/A 05/24/2013   Procedure: ESOPHAGOGASTRODUODENOSCOPY (EGD);  Surgeon: Rogene Houston, MD;  [facility];  Service: Endoscopy;  Laterality: N/A;  730  . ESOPHAGOGASTRODUODENOSCOPY N/A 06/21/2014   Procedure: ESOPHAGOGASTRODUODENOSCOPY (EGD);  Surgeon: Rogene Houston, MD;  [facility];  Service: Endoscopy;  Laterality: N/A;  930-rescheduled 10/14 @ 1200 Ann to notify pt  . EYE SURGERY  08   cataract surgery of the left eye  . FEMUR IM NAIL Left 03/02/2017   Procedure: INTRAMEDULLARY (IM) NAIL FEMORAL;  Surgeon: Rod Can, MD;  Location: [facility];  Service: Orthopedics;  Laterality: Left;  . HARDWARE REMOVAL Right 01/17/2013   Procedure: REMOVAL OF HARDWARE AND EXCISION ULNAR STYLOID RIGHT WRIST;  Surgeon: Tennis Must, MD;  Location: [facility];  Service: Orthopedics;  Laterality: Right;  . MYRINGOTOMY  2012   both ears  . ORIF FEMUR FRACTURE Left 04/23/2017   Procedure: OPEN REDUCTION INTERNAL FIXATION (ORIF) DISTAL FEMUR FRACTURE (FRACTURE AROUND FEMORAL NAIL);  Surgeon: Altamese [place], MD;  Location: [facility];  Service: Orthopedics;  Laterality: Left;  . TONSILLECTOMY    . VAGINAL HYSTERECTOMY  1972  . WRIST SURGERY     rt wrist hardwear removal   HPI:  [name] is a 77 y.o. female with medical history significant of anemia, aortic insufficiency, back pain/DDD, bilateral carotid stenosis, liver cirrhosis due to NASH, recent closed left femoral fracture, CAD, history of NSTEMI, recent stent placement, hypertension, frequent falls, GERD,/hiatal hernia, hyperlipidemia, IBS,  OSA not on CPAP, pericardial effusion, history of paroxysmal SVT, recurrent UTIs, seizures on phenytoin, type 2 diabetes, recent ORIF for left femoral fracture, history of CVA who is coming to the emergency department due to slurred speech and left-sided weakness shortly after going to sleep at about 20-30 last night.  She mentioned that the weakness on the left side is different from what affected her on her previous CVA.  She denies headache, blurred vision, dizziness, tinnitus or numbness.  No fever, chills, sore throat, dyspnea, wheezing, hemoptysis, recent chest pain, palpitations, diaphoresis, PND or orthopnea.  She has had mild pitting edema of her LLE due to recent surgery.  Denies nausea, emesis, abdominal pain, diarrhea constipation, melena or hematochezia.  Denies dysuria, frequency or hematuria.  No heat or cold intolerance.  No polyuria, polydipsia, polyphagia.   Assessment / Plan / Recommendation Clinical Impression  Pt was provided speech language evaluation by means of the [facility]. Pt presents with mild/moderate cognitive impairment achieving a score of 15/30. Pt's speech was fluent. Pt reports new worsened difficulty with memory and reports her thinking is "slower". Pt reports her granddaughter lives with her but that she still drives when able and independently does her bills and would like  to continue to do so. Based on the brief assessment completed, pt demonstrates impairment in executive funcitoning (scoring 2/5) (highly correlated with safety completing household tasks such as bills, cooking, laundry). Moderate impairment in working memory and severe impairment with delayed recall of words recalling none of the words despite categorical cues provided. Pt was not oriented to the year or the date but was oriented to the month and place. Brief education provided regarding memory strategies. Pt would benefit from more extensive cognitive testing with [facility] or Outpatient ST and treatment as  indicated in order to return Pt to cognitive baseline and facilitate maintenance of independence. There are no further ST needs noted while Pt is in acute setting. ST to sign off.    SLP Assessment  SLP Visit Diagnosis: Cognitive communication deficit (R41.841)    Follow Up Recommendations  Home health SLP;Outpatient SLP          SLP Evaluation Cognition  Overall Cognitive Status: Impaired/Different from baseline Arousal/Alertness: Awake/alert Orientation Level: Oriented to person;Oriented to place;Oriented to situation;Disoriented to time Attention: Focused Focused Attention: Appears intact Memory: Impaired Memory Impairment: Storage deficit;Decreased recall of new information;Decreased short term memory Decreased Short Term Memory: Verbal basic Awareness: Appears intact Problem Solving: Impaired Problem Solving Impairment: Verbal basic Executive Function: Reasoning;Sequencing;Organizing;Decision Making Reasoning: Impaired Reasoning Impairment: Verbal basic Sequencing: Impaired Sequencing Impairment: Verbal basic Organizing: Impaired Organizing Impairment: Verbal basic Decision Making: Impaired Decision Making Impairment: Verbal basic       Comprehension  Auditory Comprehension Overall Auditory Comprehension: Appears within functional limits for tasks assessed Visual Recognition/Discrimination Discrimination: Within Function Limits    Expression Expression Primary Mode of Expression: Verbal Verbal Expression Overall Verbal Expression: Appears within functional limits for tasks assessed Written Expression Dominant Hand: Right   Oral / Motor  Oral Motor/Sensory Function Overall Oral Motor/Sensory Function: Mild impairment Facial ROM: Reduced left Facial Symmetry: Abnormal symmetry left Facial Strength: Reduced left Facial Sensation: Within Functional Limits Lingual ROM: Within Functional Limits Lingual Symmetry: Within Functional Limits Motor Speech Overall  Motor Speech: Appears within functional limits for tasks assessed      Dametrius Sanjuan H. Roddie Mc, CCC-SLP Speech Language Pathologist                     Wende Bushy 05/07/2018, 2:54 PM

## 2018-05-07 NOTE — ED Notes (Signed)
[name]- Daughter-in-law [phone]

## 2018-05-07 NOTE — H&P (Signed)
History and Physical    [name] TWS:[id number] DOB: [birth year] DOA: 05/07/2018  PCP: Redmond School, MD   Patient coming from: [facility]  I have personally briefly reviewed patient's old medical records in [place]  Chief Complaint: Left-sided weakness and slurred speech.  HPI: [name] is a 77 y.o. female with medical history significant of anemia, aortic insufficiency, back pain/DDD, bilateral carotid stenosis, liver cirrhosis due to NASH, recent closed left femoral fracture, CAD, history of NSTEMI, recent stent placement, hypertension, frequent falls, GERD,/hiatal hernia, hyperlipidemia, IBS, OSA not on CPAP, pericardial effusion, history of paroxysmal SVT, recurrent UTIs, seizures on phenytoin, type 2 diabetes, recent ORIF for left femoral fracture, history of CVA who is coming to the emergency department due to slurred speech and left-sided weakness shortly after going to sleep at about 20-30 last night.  She mentioned that the weakness on the left side is different from what affected her on her previous CVA.  She denies headache, blurred vision, dizziness, tinnitus or numbness.  No fever, chills, sore throat, dyspnea, wheezing, hemoptysis, recent chest pain, palpitations, diaphoresis, PND or orthopnea.  She has had mild pitting edema of her LLE due to recent surgery.  Denies nausea, emesis, abdominal pain, diarrhea constipation, melena or hematochezia.  Denies dysuria, frequency or hematuria.  No heat or cold intolerance.  No polyuria, polydipsia, polyphagia.  ED Course: Initial vital signs temperature 96.7 F, pulse 64, respirations 18, blood pressure 70/35 mmHg and O2 sat 96% on room air.  She received 2 L of NS bolus, which not only improve her BP, but improved her symptoms significantly..  Her urinalysis was normal.  Her urine toxicology was normal.  CBC showed a white count of 5.6 with a normal differential, hemoglobin 10.7 g/dL and platelets 113.  PT was 14.8 and APTT 30  seconds.  INR was 1.17.  EKG was sinus rhythm with borderline prolonged PR interval showing old anterior infarct.  Troponin was mildly elevated at 0.04 ng/mL.  alcohol level was negative.  Her sodium 141, potassium 3.9, chloride 111, CO2 20 mmol/L.  BUN 20, creatinine 1.47, glucose 173 mg/dL.  Total protein was 5.5 and albumin 3.1 g/dL.  Her AST was mildly elevated at 49 units/L.  Imaging: CT head did not show any acute intracranial pathology.  CT angios chest, abdomen and pelvis did not show aneurysm, dissection or any other acute thoracic, abdominal or pelvic pathology.  CTA of head and neck showed normal perfusion.  Please see images and full radiology report for further detail.  Review of Systems: As per HPI otherwise 10 point review of systems negative.   Past Medical History:  Diagnosis Date  . Anemia   . Aortic insufficiency    Moderate  . Back pain   . Carotid stenosis, bilateral   . Cirrhosis ([place])   . Closed left femoral fracture ([place]) 04/21/2017  . Coronary artery disease    Stent x 2 RCA 1995, cardiac catheterization 09/2016 showing only mild atherosclerosis  . DDD (degenerative disc disease), lumbar   . Essential hypertension   . Falls   . GERD (gastroesophageal reflux disease)   . Hiatal hernia   . History of stroke   . Hypercholesteremia   . IBS (irritable bowel syndrome)   . Non-alcoholic fatty liver disease   . OSA (obstructive sleep apnea)    no CPAP  . Pericardial effusion    a. small by echo 2018.  Marland Kitchen PSVT (paroxysmal supraventricular tachycardia) ([place])   . Recurrent UTI   .  Seizures ([place])   . Stroke ([place])   . Type 2 diabetes mellitus ([place])    off medication  . Varices, esophageal ([place])     Past Surgical History:  Procedure Laterality Date  . [place]  . APPENDECTOMY    . BACK SURGERY  1985  . CARDIAC CATHETERIZATION  [phone]   Stent to the proximal RCA after MI   . CARDIAC CATHETERIZATION N/A 09/26/2016   Procedure: Left Heart  Cath and Coronary Angiography;  Surgeon: Leonie Man, MD;  Location: [facility];  Service: Cardiovascular;  Laterality: N/A;  . CATARACT EXTRACTION W/PHACO Right 07/04/2014   Procedure: CATARACT EXTRACTION PHACO AND INTRAOCULAR LENS PLACEMENT (IOC);  Surgeon: Elta Guadeloupe T. Gershon Crane, MD;  Location: [facility];  Service: Ophthalmology;  Laterality: Right;  CDE:13.13  . COLONOSCOPY    . DILATION AND CURETTAGE OF UTERUS     x2  . Epi Retinal Membrane Peel Left   . ERCP    . ESOPHAGEAL BANDING N/A 04/01/2013   Procedure: ESOPHAGEAL BANDING;  Surgeon: Rogene Houston, MD;  Location: [facility];  Service: Endoscopy;  Laterality: N/A;  . ESOPHAGEAL BANDING N/A 05/24/2013   Procedure: ESOPHAGEAL BANDING;  Surgeon: Rogene Houston, MD;  [facility];  Service: Endoscopy;  Laterality: N/A;  . ESOPHAGEAL BANDING N/A 06/21/2014   Procedure: ESOPHAGEAL BANDING;  Surgeon: Rogene Houston, MD;  [facility];  Service: Endoscopy;  Laterality: N/A;  . ESOPHAGOGASTRODUODENOSCOPY N/A 04/01/2013   Procedure: ESOPHAGOGASTRODUODENOSCOPY (EGD);  Surgeon: Rogene Houston, MD;  [facility];  Service: Endoscopy;  Laterality: N/A;  230-rescheduled to 8:30am Ann notified pt  . ESOPHAGOGASTRODUODENOSCOPY N/A 05/24/2013   Procedure: ESOPHAGOGASTRODUODENOSCOPY (EGD);  Surgeon: Rogene Houston, MD;  [facility];  Service: Endoscopy;  Laterality: N/A;  730  . ESOPHAGOGASTRODUODENOSCOPY N/A 06/21/2014   Procedure: ESOPHAGOGASTRODUODENOSCOPY (EGD);  Surgeon: Rogene Houston, MD;  [facility];  Service: Endoscopy;  Laterality: N/A;  930-rescheduled 10/14 @ 1200 Ann to notify pt  . EYE SURGERY  08   cataract surgery of the left eye  . FEMUR IM NAIL Left 03/02/2017   Procedure: INTRAMEDULLARY (IM) NAIL FEMORAL;  Surgeon: Rod Can, MD;  Location: [facility];  Service: Orthopedics;  Laterality: Left;  . HARDWARE REMOVAL Right 01/17/2013   Procedure: REMOVAL OF HARDWARE AND  EXCISION ULNAR STYLOID RIGHT WRIST;  Surgeon: Tennis Must, MD;  Location: [facility];  Service: Orthopedics;  Laterality: Right;  . MYRINGOTOMY  2012   both ears  . ORIF FEMUR FRACTURE Left 04/23/2017   Procedure: OPEN REDUCTION INTERNAL FIXATION (ORIF) DISTAL FEMUR FRACTURE (FRACTURE AROUND FEMORAL NAIL);  Surgeon: Altamese Austin, MD;  Location: [facility];  Service: Orthopedics;  Laterality: Left;  . TONSILLECTOMY    . VAGINAL HYSTERECTOMY  1972  . WRIST SURGERY     rt wrist hardwear removal     reports that she has been smoking cigarettes. She has a 12.50 pack-year smoking history. She has never used smokeless tobacco. She reports that she does not drink alcohol or use drugs.  Allergies  Allergen Reactions  . Tape Rash and Other (See Comments)    Please use paper tape    Family History  Problem Relation Age of Onset  . Diabetes Mother   . Heart failure Father   . Heart failure Maternal Aunt     Prior to Admission medications   Medication Sig Start Date End  Date Taking? Authorizing Provider  aspirin EC 81 MG tablet Take 81 mg by mouth daily.    Jake Samples, PA-C  atorvastatin (LIPITOR) 40 MG tablet Take 40 mg by mouth daily.    Jake Samples, PA-C  carvedilol (COREG) 12.5 MG tablet Take 1 tablet (12.5 mg total) by mouth 2 (two) times daily with a meal. 08/26/17   Tat, Shanon Brow, MD  ciprofloxacin (CIPRO) 500 MG tablet Take 1 tablet by mouth 2 (two) times daily. 01/06/18   [provider]  diltiazem (CARDIZEM CD) 120 MG 24 hr capsule Take 120 mg by mouth daily.    Jake Samples, PA-C  DiphenhydrAMINE HCl, Sleep, (SLEEP AID) 25 MG CAPS Take 1-2 capsules by mouth at bedtime as needed (for sleep).    [provider]  furosemide (LASIX) 20 MG tablet Take 20 mg by mouth daily.    [provider]  isosorbide mononitrate (IMDUR) 30 MG 24 hr tablet Take 30 mg by mouth daily.     Delman Cheadle J, PA-C  lisinopril (PRINIVIL,ZESTRIL)  20 MG tablet Take 20 mg by mouth daily.    Jake Samples, PA-C  meloxicam (MOBIC) 7.5 MG tablet Take 7.5 mg by mouth daily.    [provider]  omeprazole (PRILOSEC) 20 MG capsule Take 1 capsule (20 mg total) by mouth daily. 03/05/18   Setzer, Rona Ravens, NP  ondansetron (ZOFRAN ODT) 4 MG disintegrating tablet Take 1 tablet (4 mg total) by mouth every 8 (eight) hours as needed for nausea or vomiting. Patient not taking: Reported on 03/02/2018 01/11/18   Nat Christen, MD  ondansetron [facility] 4 MG tablet Take 1 tablet (4 mg total) by mouth every 8 (eight) hours as needed for nausea or vomiting. 01/26/18   Vaughan Basta, Rona Ravens, NP  phenazopyridine (PYRIDIUM) 95 MG tablet Take 95 mg by mouth 3 (three) times daily as needed for pain.    [provider]  phenytoin (DILANTIN) 200 MG ER capsule Take 1 capsule (200 mg total) by mouth daily. Patient not taking: Reported on 03/02/2018 08/27/17   Orson Eva, MD  traZODone (DESYREL) 50 MG tablet Take 50 mg by mouth at bedtime.    [provider]    Physical Exam: Vitals:   05/07/18 0420 05/07/18 0425 05/07/18 0430 05/07/18 0445  BP: (!) 107/46 (!) 106/46 (!) 105/54 (!) 102/39  Pulse: 76 77 81 77  Resp: 18 14 10 16   Temp:      TempSrc:      SpO2: 96% 96% 99% 96%    Constitutional: NAD, calm, comfortable Eyes: PERRL, lids and conjunctivae normal ENMT: Mucous membranes are moist. Posterior pharynx clear of any exudate or lesions. Neck: Normal, supple, no masses, no thyromegaly Respiratory: Clear to auscultation bilaterally, no wheezing, no crackles. Normal respiratory effort. No accessory muscle use.  Cardiovascular: Regular rate and rhythm, no murmurs / rubs / gallops. No extremity edema. 2+ pedal pulses. No carotid bruits.  Abdomen: Soft, no tenderness, no masses palpated. No hepatosplenomegaly. Bowel sounds positive.  Musculoskeletal: no clubbing / cyanosis.  Mildly decreased LLE ROM, no contractures. Normal muscle tone.  Skin:  no rashes, lesions, ulcers on limited dermatological examination. Neurologic: CN 2-12 grossly intact. Sensation intact, DTR normal. Strength 5/5 in all 4.  Psychiatric: Normal judgment and insight. Alert and oriented x 3. Normal mood.    Labs on Admission: I have personally reviewed following labs and imaging studies  CBC: Recent Labs  Lab 05/07/18 0033  WBC 5.6  NEUTROABS 3.0  HGB 10.7*  HCT 33.0*  MCV 96.2  PLT 347*   Basic Metabolic Panel: Recent Labs  Lab 05/07/18 0033  NA 141  K 3.9  CL 111  CO2 20*  GLUCOSE 173*  BUN 20  CREATININE 1.47*  CALCIUM 8.8*   GFR: CrCl cannot be calculated (Unknown ideal weight.). Liver Function Tests: Recent Labs  Lab 05/07/18 0033  AST 49*  ALT 34  ALKPHOS 94  BILITOT 0.6  PROT 5.5*  ALBUMIN 3.1*   No results for input(s): LIPASE, AMYLASE in the last 168 hours. No results for input(s): AMMONIA in the last 168 hours. Coagulation Profile: Recent Labs  Lab 05/07/18 0033  INR 1.17   Cardiac Enzymes: Recent Labs  Lab 05/07/18 0033  TROPONINI 0.04*   BNP (last 3 results) No results for input(s): PROBNP in the last 8760 hours. HbA1C: No results for input(s): HGBA1C in the last 72 hours. CBG: Recent Labs  Lab 05/07/18 0030  GLUCAP 173*   Lipid Profile: No results for input(s): CHOL, HDL, LDLCALC, TRIG, CHOLHDL, LDLDIRECT in the last 72 hours. Thyroid Function Tests: No results for input(s): TSH, T4TOTAL, FREET4, T3FREE, THYROIDAB in the last 72 hours. Anemia Panel: No results for input(s): VITAMINB12, FOLATE, FERRITIN, TIBC, IRON, RETICCTPCT in the last 72 hours. Urine analysis:    Component Value Date/Time   COLORURINE YELLOW 05/07/2018 0252   APPEARANCEUR CLEAR 05/07/2018 0252   LABSPEC 1.025 05/07/2018 0252   PHURINE 5.0 05/07/2018 0252   GLUCOSEU NEGATIVE 05/07/2018 0252   HGBUR NEGATIVE 05/07/2018 0252   BILIRUBINUR NEGATIVE 05/07/2018 0252   KETONESUR NEGATIVE 05/07/2018 0252   PROTEINUR NEGATIVE  05/07/2018 0252   UROBILINOGEN 0.2 07/09/2015 0930   NITRITE NEGATIVE 05/07/2018 0252   LEUKOCYTESUR NEGATIVE 05/07/2018 0252    Radiological Exams on Admission: Ct Angio Head W Or Wo Contrast  Result Date: 05/07/2018 CLINICAL DATA:  77 y/o  F; slurred speech and left-sided weakness. EXAM: CT ANGIOGRAPHY HEAD AND NECK CT PERFUSION BRAIN TECHNIQUE: Multidetector CT imaging of the head and neck was performed using the standard protocol during bolus administration of intravenous contrast. Multiplanar CT image reconstructions and MIPs were obtained to evaluate the vascular anatomy. Carotid stenosis measurements (when applicable) are obtained utilizing NASCET criteria, using the distal internal carotid diameter as the denominator. Multiphase CT imaging of the brain was performed following IV bolus contrast injection. Subsequent parametric perfusion maps were calculated using RAPID software. CONTRAST:  181m ISOVUE-370 IOPAMIDOL (ISOVUE-370) INJECTION 76% COMPARISON:  05/07/2018 CT head. 03/01/2017 CT angiogram head and neck. FINDINGS: CTA NECK FINDINGS Aortic arch: Standard branching. Imaged portion shows no evidence of aneurysm or dissection. No high-grade stenosis of the major arch vessel origins. Mild left subclavian artery origin stenosis with calcified plaque. Mild calcific atherosclerosis. Right carotid system: No evidence of dissection, stenosis (50% or greater) or occlusion. Mild calcific atherosclerosis. Left carotid system: Severe greater than 70% stenosis of the left carotid bifurcation with dense calcified plaque. No dissection, occlusion, or additional segment of stenosis. Vertebral arteries: Codominant. No evidence of dissection, stenosis (50% or greater) or occlusion. Mild calcific atherosclerosis. Skeleton: C3-4 grade 1 retrolisthesis. C4-5 grade 1 anterolisthesis. Severe discogenic degenerative changes at the C3-4 and C5-C7 levels. Prominent upper cervical facet arthropathy. No high-grade bony  canal stenosis. Other neck: 10 mm nodule in the left lobe of the thyroid. Upper chest: Please refer to the concurrent CT angiogram of the chest. Review of the MIP images confirms the above findings CTA HEAD FINDINGS Anterior circulation:  No significant stenosis, proximal occlusion, aneurysm, or vascular malformation. Calcified plaque of carotid siphons with mild less than 50% bilateral paraclinoid stenosis. Posterior circulation: Left vertebral artery calcified plaque with mild less than 50% stenosis. Stable moderate to severe right P2 stenosis. No large vessel occlusion, aneurysm, or additional segment of high-grade stenosis. Venous sinuses: As permitted by contrast timing, patent. Anatomic variants: None significant. Review of the MIP images confirms the above findings CT Brain Perfusion Findings: CBF (<30%) Volume: 49m Perfusion (Tmax>6.0s) volume: 047mMismatch Volume: 29m8mnfarction Location:Not applicable. IMPRESSION: 1. Normal CT brain perfusion. 2. No large vessel occlusion, aneurysm, or vascular malformation. 3. Stable severe left proximal ICA stenosis with calcified plaque. 4. Stable mild left subclavian artery origin stenosis with calcified plaque. 5. Stable right P2 segment moderate to severe stenosis. 6. Stable left vertebral artery mild stenosis. 7. Stable bilateral paraclinoid ICA mild stenosis. 8. Stable advanced cervical spondylosis with severe discogenic degenerative changes at the C3 through C7 levels. 9. Calcific atherosclerosis of aorta, carotid systems, and vertebral arteries. These results were called by telephone at the time of interpretation on 05/07/2018 at 2:51 am to Dr. PolBetsey Holidayho verbally acknowledged these results. Electronically Signed   By: LanKristine GarbeD.   On: 05/07/2018 02:50   Ct Angio Neck W Or Wo Contrast  Result Date: 05/07/2018 CLINICAL DATA:  76 78o  F; slurred speech and left-sided weakness. EXAM: CT ANGIOGRAPHY HEAD AND NECK CT PERFUSION BRAIN TECHNIQUE:  Multidetector CT imaging of the head and neck was performed using the standard protocol during bolus administration of intravenous contrast. Multiplanar CT image reconstructions and MIPs were obtained to evaluate the vascular anatomy. Carotid stenosis measurements (when applicable) are obtained utilizing NASCET criteria, using the distal internal carotid diameter as the denominator. Multiphase CT imaging of the brain was performed following IV bolus contrast injection. Subsequent parametric perfusion maps were calculated using RAPID software. CONTRAST:  175m33mOVUE-370 IOPAMIDOL (ISOVUE-370) INJECTION 76% COMPARISON:  05/07/2018 CT head. 03/01/2017 CT angiogram head and neck. FINDINGS: CTA NECK FINDINGS Aortic arch: Standard branching. Imaged portion shows no evidence of aneurysm or dissection. No high-grade stenosis of the major arch vessel origins. Mild left subclavian artery origin stenosis with calcified plaque. Mild calcific atherosclerosis. Right carotid system: No evidence of dissection, stenosis (50% or greater) or occlusion. Mild calcific atherosclerosis. Left carotid system: Severe greater than 70% stenosis of the left carotid bifurcation with dense calcified plaque. No dissection, occlusion, or additional segment of stenosis. Vertebral arteries: Codominant. No evidence of dissection, stenosis (50% or greater) or occlusion. Mild calcific atherosclerosis. Skeleton: C3-4 grade 1 retrolisthesis. C4-5 grade 1 anterolisthesis. Severe discogenic degenerative changes at the C3-4 and C5-C7 levels. Prominent upper cervical facet arthropathy. No high-grade bony canal stenosis. Other neck: 10 mm nodule in the left lobe of the thyroid. Upper chest: Please refer to the concurrent CT angiogram of the chest. Review of the MIP images confirms the above findings CTA HEAD FINDINGS Anterior circulation: No significant stenosis, proximal occlusion, aneurysm, or vascular malformation. Calcified plaque of carotid siphons with  mild less than 50% bilateral paraclinoid stenosis. Posterior circulation: Left vertebral artery calcified plaque with mild less than 50% stenosis. Stable moderate to severe right P2 stenosis. No large vessel occlusion, aneurysm, or additional segment of high-grade stenosis. Venous sinuses: As permitted by contrast timing, patent. Anatomic variants: None significant. Review of the MIP images confirms the above findings CT Brain Perfusion Findings: CBF (<30%) Volume: 29mL 63mfusion (Tmax>6.0s) volume: 29mL M48match Volume: 29mL In55mction Location:Not applicable. IMPRESSION:  1. Normal CT brain perfusion. 2. No large vessel occlusion, aneurysm, or vascular malformation. 3. Stable severe left proximal ICA stenosis with calcified plaque. 4. Stable mild left subclavian artery origin stenosis with calcified plaque. 5. Stable right P2 segment moderate to severe stenosis. 6. Stable left vertebral artery mild stenosis. 7. Stable bilateral paraclinoid ICA mild stenosis. 8. Stable advanced cervical spondylosis with severe discogenic degenerative changes at the C3 through C7 levels. 9. Calcific atherosclerosis of aorta, carotid systems, and vertebral arteries. These results were called by telephone at the time of interpretation on 05/07/2018 at 2:51 am to Dr. Betsey Holiday, who verbally acknowledged these results. Electronically Signed   By: Kristine Garbe M.D.   On: 05/07/2018 02:50   Ct Cerebral Perfusion W Contrast  Result Date: 05/07/2018 CLINICAL DATA:  77 y/o  F; slurred speech and left-sided weakness. EXAM: CT ANGIOGRAPHY HEAD AND NECK CT PERFUSION BRAIN TECHNIQUE: Multidetector CT imaging of the head and neck was performed using the standard protocol during bolus administration of intravenous contrast. Multiplanar CT image reconstructions and MIPs were obtained to evaluate the vascular anatomy. Carotid stenosis measurements (when applicable) are obtained utilizing NASCET criteria, using the distal internal carotid  diameter as the denominator. Multiphase CT imaging of the brain was performed following IV bolus contrast injection. Subsequent parametric perfusion maps were calculated using RAPID software. CONTRAST:  139m ISOVUE-370 IOPAMIDOL (ISOVUE-370) INJECTION 76% COMPARISON:  05/07/2018 CT head. 03/01/2017 CT angiogram head and neck. FINDINGS: CTA NECK FINDINGS Aortic arch: Standard branching. Imaged portion shows no evidence of aneurysm or dissection. No high-grade stenosis of the major arch vessel origins. Mild left subclavian artery origin stenosis with calcified plaque. Mild calcific atherosclerosis. Right carotid system: No evidence of dissection, stenosis (50% or greater) or occlusion. Mild calcific atherosclerosis. Left carotid system: Severe greater than 70% stenosis of the left carotid bifurcation with dense calcified plaque. No dissection, occlusion, or additional segment of stenosis. Vertebral arteries: Codominant. No evidence of dissection, stenosis (50% or greater) or occlusion. Mild calcific atherosclerosis. Skeleton: C3-4 grade 1 retrolisthesis. C4-5 grade 1 anterolisthesis. Severe discogenic degenerative changes at the C3-4 and C5-C7 levels. Prominent upper cervical facet arthropathy. No high-grade bony canal stenosis. Other neck: 10 mm nodule in the left lobe of the thyroid. Upper chest: Please refer to the concurrent CT angiogram of the chest. Review of the MIP images confirms the above findings CTA HEAD FINDINGS Anterior circulation: No significant stenosis, proximal occlusion, aneurysm, or vascular malformation. Calcified plaque of carotid siphons with mild less than 50% bilateral paraclinoid stenosis. Posterior circulation: Left vertebral artery calcified plaque with mild less than 50% stenosis. Stable moderate to severe right P2 stenosis. No large vessel occlusion, aneurysm, or additional segment of high-grade stenosis. Venous sinuses: As permitted by contrast timing, patent. Anatomic variants: None  significant. Review of the MIP images confirms the above findings CT Brain Perfusion Findings: CBF (<30%) Volume: 061mPerfusion (Tmax>6.0s) volume: 15m71mismatch Volume: 15mL49mfarction Location:Not applicable. IMPRESSION: 1. Normal CT brain perfusion. 2. No large vessel occlusion, aneurysm, or vascular malformation. 3. Stable severe left proximal ICA stenosis with calcified plaque. 4. Stable mild left subclavian artery origin stenosis with calcified plaque. 5. Stable right P2 segment moderate to severe stenosis. 6. Stable left vertebral artery mild stenosis. 7. Stable bilateral paraclinoid ICA mild stenosis. 8. Stable advanced cervical spondylosis with severe discogenic degenerative changes at the C3 through C7 levels. 9. Calcific atherosclerosis of aorta, carotid systems, and vertebral arteries. These results were called by telephone at the time of interpretation  on 05/07/2018 at 2:51 am to Dr. Betsey Holiday, who verbally acknowledged these results. Electronically Signed   By: Kristine Garbe M.D.   On: 05/07/2018 02:50   Ct Angio Chest/abd/pel For Dissection W And/or W/wo  Result Date: 05/07/2018 CLINICAL DATA:  77 year old female with slurred speech and left-sided weakness. Evaluate for aortic dissection. EXAM: CT ANGIOGRAPHY CHEST, ABDOMEN AND PELVIS TECHNIQUE: Multidetector CT imaging through the chest, abdomen and pelvis was performed using the standard protocol during bolus administration of intravenous contrast. Multiplanar reconstructed images and MIPs were obtained and reviewed to evaluate the vascular anatomy. CONTRAST:  172m ISOVUE-370 IOPAMIDOL (ISOVUE-370) INJECTION 76% COMPARISON:  CT of the abdomen pelvis dated 01/11/2018 FINDINGS: CTA CHEST FINDINGS Cardiovascular: There is no cardiomegaly or pericardial effusion. Multi vessel coronary vascular calcification as well as calcification of the mitral annulus. There is mild atherosclerotic calcification of the thoracic aorta. No aneurysmal  dilatation or dissection. The origins of the great vessels of the aortic arch are patent. The central pulmonary arteries are unremarkable as visualized. Mediastinum/Nodes: No hilar or mediastinal adenopathy. Mildly thickened distal esophagus likely related to cirrhosis. This appearance is similar to prior CT. An infiltrative process or neoplasm is less likely. The thyroid gland is grossly unremarkable. No mediastinal fluid collection. Lungs/Pleura: The lungs are clear. There is no pleural effusion or pneumothorax. The central airways are patent. Musculoskeletal: No chest wall abnormality. No acute or significant osseous findings. Review of the MIP images confirms the above findings. CTA ABDOMEN AND PELVIS FINDINGS VASCULAR Aorta: There is advanced atherosclerotic calcification of the aorta. There is a 2 cm infrarenal aortic ectasia. No aneurysm or dissection. Celiac: There is atherosclerotic calcification and narrowing of the origin of the celiac axis. The celiac artery however remains patent. SMA: Atherosclerotic calcification of the origin of the SMA. The SMA is patent. Renals: There is atherosclerotic calcification of the ostia of the renal arteries. The renal arteries are patent. 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 IMA: Patent without evidence of aneurysm, dissection, vasculitis or significant stenosis. Inflow: Atherosclerotic calcification of the iliac vasculature. The TI vessels however are patent. Veins: The IVC appears unremarkable. The SMV, splenic vein, and visualized portions of the main portal vein are patent. No portal venous gas. Review of the MIP images confirms the above findings. NON-VASCULAR No intra-abdominal free air.  Small perihepatic ascites. Hepatobiliary: Cirrhosis. No biliary ductal dilatation. The gallbladder is unremarkable. Pancreas: Unremarkable. No pancreatic ductal dilatation or surrounding inflammatory changes. Spleen: Normal in size without focal abnormality. Adrenals/Urinary  Tract: The adrenal glands are unremarkable. There is a 2 cm focal hypodense/nonenhancing area in the medial interpolar aspect of the left kidney, likely sequela of prior infarct versus less likely a cyst. Subcentimeter right renal hypodense lesion is too small to characterize. There is no hydronephrosis on either side. There is symmetric enhancement and excretion of contrast by both kidneys. The urinary bladder is grossly unremarkable as visualized. Stomach/Bowel: There is no bowel obstruction or active inflammation. Appendectomy. Lymphatic: No adenopathy. Reproductive: Hysterectomy.  No pelvic mass. Other: Upper abdominal varices including esophageal, gastric, and perisplenic varices. Musculoskeletal: Osteopenia. Partially visualized left femoral intramedullary rod and transcervical screw. Old left femoral neck fracture which is incompletely healed. Old-appearing fracture of the left pubic ramus at the symphysis pubis. Bilateral L5 pars defects with grade 1 L5-S1 anterolisthesis. No acute osseous pathology. Review of the MIP images confirms the above findings. IMPRESSION: 1. No acute intrathoracic, abdominal, or pelvic pathology. No aortic aneurysm or dissection. A 2 cm infrarenal aortic  ectasia. 2. Cirrhosis with evidence of portal hypertension, ascites, and upper abdominal varices. 3. Thickened appearance of the distal esophagus, similar to prior CT, and likely related to cirrhosis and esophageal varices. 4.  Aortic Atherosclerosis (ICD10-I70.0). 5. Additional nonacute and nonemergent findings as described above. Electronically Signed   By: Anner Crete M.D.   On: 05/07/2018 02:52   Ct Head Code Stroke Wo Contrast  Result Date: 05/07/2018 CLINICAL DATA:  Code stroke. 77 y/o F; left-sided weakness and slurred speech. EXAM: CT HEAD WITHOUT CONTRAST TECHNIQUE: Contiguous axial images were obtained from the base of the skull through the vertex without intravenous contrast. COMPARISON:  04/12/2018 CT head.  FINDINGS: Brain: No evidence of acute infarction, hemorrhage, hydrocephalus, extra-axial collection or mass lesion/mass effect. Stable chronic infarction of the right lateral frontal lobe and superior insula. Multiple small chronic infarctions are present in the bilateral caudate heads, anterior limbs of internal capsule, bilateral lentiform nuclei, and bilateral thalami. Additionally, there is a small chronic infarction in the right inferior cerebellar hemisphere. Stable mild volume loss of the brain. Vascular: Calcific atherosclerosis of carotid siphons. No proximal hyperdense vessel identified. Skull: Normal. Negative for fracture or focal lesion. Sinuses/Orbits: No acute finding. Other: Bilateral intra-ocular lens replacement. ASPECTS [facility] Stroke Program Early CT Score) - Ganglionic level infarction (caudate, lentiform nuclei, internal capsule, insula, M1-M3 cortex): 7 - Supraganglionic infarction (M4-M6 cortex): 3 Total score (0-10 with 10 being normal): 10 IMPRESSION: 1. No acute intracranial abnormality identified. 2. Stable chronic infarction of the right lateral frontal lobe and superior insula. Stable multiple small chronic infarctions in the basal ganglia and right inferior cerebellum. Stable mild volume loss of the brain. 3. ASPECTS is 10 These results were called by telephone at the time of interpretation on 05/07/2018 at 12:29 am to Dr. Joseph Berkshire , who verbally acknowledged these results. Electronically Signed   By: Kristine Garbe M.D.   On: 05/07/2018 00:32    EKG: Independently reviewed.  Vent. rate 63 BPM PR interval * ms QRS duration 104 ms QT/QTc 474/486 ms P-R-T axes 37 18 62 Sinus rhythm Borderline prolonged PR interval Anterior infarct, old Baseline wander in lead(s) V1  Assessment/Plan Principal Problem:   TIA (transient ischemic attack) versus old CVA recurrence Symptoms improved significantly with IV hydration. Observation/telemetry. Continue  aspirin. PT/OT/speech. Check fasting lipids. Check hemoglobin A1c.   Check echocardiogram. Check MRI to her brain. Consult neurology.  Active Problems:   CAD (coronary artery disease) Continue aspirin, statin and resume carvedilol in 24 to 48 hours.    Hypercholesteremia Continue atorvastatin 40 mg p.o. daily. Monitor LFTs as needed. Fasting lipid profile follow-up as an outpatient.    GERD (gastroesophageal reflux disease) Protonix 40 mg p.o. daily.    DM type 2 (diabetes mellitus, type 2) (HCC) Carbohydrate modified diet. CBG monitoring with regular insulin sliding scale while in the hospital.    Seizure disorder ([place]) Continue phenytoin. Monitor level as needed.    OSA (obstructive sleep apnea) Per patient, she does not use CPAP.    Aortic insufficiency Stable. Monitor intake and output. Continue carvedilol.    HTN (hypertension) Currently hypotensive. Hold carvedilol 12.5 mg by mouth twice a day. Hold Cardizem 120 mg p.o. Daily. May need to adjust medications before discharge. Monitor blood pressure.    Thrombocytopenia (HCC) Monitor platelets.     DVT prophylaxis: Heparin SQ. Code Status: Full code. Family Communication:  Disposition Plan: Observation for TIA work-up. Consults called: Routine neurology consult. Admission status: Observation/telemetry.   Gerri Lins  Olevia Bowens MD [facility] Pager [phone].  If 7PM-7AM, please contact night-coverage [email or web address] Password [email or web address]  05/07/2018, 7:01 AM

## 2018-05-07 NOTE — Progress Notes (Signed)
CRITICAL VALUE ALERT  Critical Value:  Troponin 0.13  Date & Time Notied:  05/07/18 @ 1415.  Provider Notified: Roderic Palau, MD.   Orders Received/Actions taken: Awaiting new orders.

## 2018-05-07 NOTE — ED Notes (Signed)
Date and time results received: 05/07/18 0150 (use smartphrase ".now" to insert current time)  Test: Troponin Critical Value: 0.04  Name of Provider Notified: Dr Betsey Holiday  Orders Received? Or Actions Taken?:

## 2018-05-07 NOTE — ED Notes (Signed)
Pt kept npo. slp order placed

## 2018-05-07 NOTE — ED Provider Notes (Signed)
[facility] EMERGENCY DEPARTMENT Provider Note   CSN: [id number] Arrival date & time: 05/07/18  0016     History   Chief Complaint Chief Complaint  Patient presents with  . Code Stroke    HPI [name] is a 77 y.o. female.  Patient brought to the emergency department by ambulance from home for possible stroke.  She does have a history of previous stroke with left-sided weakness.  Family reports no speech difficulty at baseline.  She was seen in her normal state approximately 30 minutes before arrival.  Approximately 10 to 15 minutes before arrival he noticed that the left side of her face was drooping more than usual and she was having difficulty speaking.  Patient is unaware of this deficit, just reports that she feels "sick".     Past Medical History:  Diagnosis Date  . Anemia   . Aortic insufficiency    Moderate  . Back pain   . Carotid stenosis, bilateral   . Cirrhosis ([place])   . Closed left femoral fracture ([place]) 04/21/2017  . Coronary artery disease    Stent x 2 RCA 1995, cardiac catheterization 09/2016 showing only mild atherosclerosis  . DDD (degenerative disc disease), lumbar   . Essential hypertension   . Falls   . GERD (gastroesophageal reflux disease)   . Hiatal hernia   . History of stroke   . Hypercholesteremia   . IBS (irritable bowel syndrome)   . Non-alcoholic fatty liver disease   . OSA (obstructive sleep apnea)    no CPAP  . Pericardial effusion    a. small by echo 2018.  Marland Kitchen PSVT (paroxysmal supraventricular tachycardia) ([place])   . Recurrent UTI   . Seizures ([place])   . Stroke ([place])   . Type 2 diabetes mellitus ([place])    off medication  . Varices, esophageal [facility])     Patient Active Problem List   Diagnosis Date Noted  . Syncope and collapse   . Syncope 08/24/2017  . Closed left femoral fracture ([place]) 04/21/2017  . Seizure disorder ([place]) 04/21/2017  . Closed pertrochanteric fracture of femur, left, initial encounter ([place]) 03/02/2017  .  Seizure ([place]) 02/28/2017  . Hip fracture ([place]) 02/28/2017  . Chest pain 12/07/2016  . Atypical chest pain 12/07/2016  . Tachycardia   . NSTEMI (non-ST elevated myocardial infarction) ([place]) 11/02/2016  . Multifocal atrial tachycardia ([place]) 09/25/2016  . Elevated troponin 09/24/2016  . Hypokalemia 09/23/2016  . TIA (transient ischemic attack) 05/05/2016  . Intractable nausea and vomiting 10/15/2015  . Acute coronary syndrome ([place]) 06/01/2015  . Bronchitis 06/01/2015  . Thrombocytopenia ([place]) 06/01/2015  . Cerebral infarction ([place]) 03/06/2014  . Lower urinary tract infectious disease   . PSVT (paroxysmal supraventricular tachycardia) ([place]) 12/09/2013  . Esophageal varices ([place]) 03/29/2013  . Hematemesis 03/29/2013  . Hepatic cirrhosis ([place]) 03/29/2013  . Presence of stent in right coronary artery 07/04/2011  . OSA (obstructive sleep apnea) 07/04/2011  . Aortic sclerosis 07/04/2011  . Aortic insufficiency 07/04/2011  . HTN (hypertension) 07/04/2011  . Carotid stenosis, bilateral 07/04/2011  . Chest pain at rest 07/03/2011  . CAD (coronary artery disease) 07/03/2011  . DM type 2 (diabetes mellitus, type 2) ([place]) 07/03/2011  . Hypercholesteremia 07/03/2011  . GERD (gastroesophageal reflux disease) 07/03/2011    Past Surgical History:  Procedure Laterality Date  . [place]  . APPENDECTOMY    . BACK SURGERY  1985  . CARDIAC CATHETERIZATION  [phone]   Stent to the proximal  RCA after MI   . CARDIAC CATHETERIZATION N/A 09/26/2016   Procedure: Left Heart Cath and Coronary Angiography;  Surgeon: Leonie Man, MD;  Location: [place] [facility];  Service: Cardiovascular;  Laterality: N/A;  . CATARACT EXTRACTION W/PHACO Right 07/04/2014   Procedure: CATARACT EXTRACTION PHACO AND INTRAOCULAR LENS PLACEMENT (IOC);  Surgeon: Elta Guadeloupe T. Gershon Crane, MD;  Location: [facility];  Service: Ophthalmology;  Laterality: Right;  CDE:13.13  . COLONOSCOPY    . DILATION AND CURETTAGE OF  UTERUS     x2  . Epi Retinal Membrane Peel Left   . ERCP    . ESOPHAGEAL BANDING N/A 04/01/2013   Procedure: ESOPHAGEAL BANDING;  Surgeon: Rogene Houston, MD;  [facility];  Service: Endoscopy;  Laterality: N/A;  . ESOPHAGEAL BANDING N/A 05/24/2013   Procedure: ESOPHAGEAL BANDING;  Surgeon: Rogene Houston, MD;  [facility];  Service: Endoscopy;  Laterality: N/A;  . ESOPHAGEAL BANDING N/A 06/21/2014   Procedure: ESOPHAGEAL BANDING;  Surgeon: Rogene Houston, MD;  [facility];  Service: Endoscopy;  Laterality: N/A;  . ESOPHAGOGASTRODUODENOSCOPY N/A 04/01/2013   Procedure: ESOPHAGOGASTRODUODENOSCOPY (EGD);  Surgeon: Rogene Houston, MD;  [facility];  Service: Endoscopy;  Laterality: N/A;  230-rescheduled to 8:30am Ann notified pt  . ESOPHAGOGASTRODUODENOSCOPY N/A 05/24/2013   Procedure: ESOPHAGOGASTRODUODENOSCOPY (EGD);  Surgeon: Rogene Houston, MD;  [facility];  Service: Endoscopy;  Laterality: N/A;  730  . ESOPHAGOGASTRODUODENOSCOPY N/A 06/21/2014   Procedure: ESOPHAGOGASTRODUODENOSCOPY (EGD);  Surgeon: Rogene Houston, MD;  [facility];  Service: Endoscopy;  Laterality: N/A;  930-rescheduled 10/14 @ 1200 Ann to notify pt  . EYE SURGERY  08   cataract surgery of the left eye  . FEMUR IM NAIL Left 03/02/2017   Procedure: INTRAMEDULLARY (IM) NAIL FEMORAL;  Surgeon: Rod Can, MD;  Location: [place];  Service: Orthopedics;  Laterality: Left;  . HARDWARE REMOVAL Right 01/17/2013   Procedure: REMOVAL OF HARDWARE AND EXCISION ULNAR STYLOID RIGHT WRIST;  Surgeon: Tennis Must, MD;  Location: [place];  Service: Orthopedics;  Laterality: Right;  . MYRINGOTOMY  2012   both ears  . ORIF FEMUR FRACTURE Left 04/23/2017   Procedure: OPEN REDUCTION INTERNAL FIXATION (ORIF) DISTAL FEMUR FRACTURE (FRACTURE AROUND FEMORAL NAIL);  Surgeon: Altamese Ravenel, MD;  Location: [place];  Service: Orthopedics;  Laterality: Left;    . TONSILLECTOMY    . VAGINAL HYSTERECTOMY  1972  . WRIST SURGERY     rt wrist hardwear removal     OB History   None      Home Medications    Prior to Admission medications   Medication Sig Start Date End Date Taking? Authorizing Provider  aspirin EC 81 MG tablet Take 81 mg by mouth daily.    Jake Samples, PA-C  atorvastatin (LIPITOR) 40 MG tablet Take 40 mg by mouth daily.    Jake Samples, PA-C  carvedilol (COREG) 12.5 MG tablet Take 1 tablet (12.5 mg total) by mouth 2 (two) times daily with a meal. 08/26/17   Tat, Shanon Brow, MD  ciprofloxacin (CIPRO) 500 MG tablet Take 1 tablet by mouth 2 (two) times daily. 01/06/18   [provider]  diltiazem (CARDIZEM CD) 120 MG 24 hr capsule Take 120 mg by mouth daily.    Jake Samples, PA-C  DiphenhydrAMINE HCl, Sleep, (SLEEP AID) 25 MG CAPS Take 1-2 capsules by mouth at bedtime as needed (for sleep).  [provider]  furosemide (LASIX) 20 MG tablet Take 20 mg by mouth daily.    [provider]  isosorbide mononitrate (IMDUR) 30 MG 24 hr tablet Take 30 mg by mouth daily.     Delman Cheadle J, PA-C  lisinopril (PRINIVIL,ZESTRIL) 20 MG tablet Take 20 mg by mouth daily.    Jake Samples, PA-C  meloxicam (MOBIC) 7.5 MG tablet Take 7.5 mg by mouth daily.    [provider]  omeprazole (PRILOSEC) 20 MG capsule Take 1 capsule (20 mg total) by mouth daily. 03/05/18   Setzer, Rona Ravens, NP  ondansetron (ZOFRAN ODT) 4 MG disintegrating tablet Take 1 tablet (4 mg total) by mouth every 8 (eight) hours as needed for nausea or vomiting. Patient not taking: Reported on 03/02/2018 01/11/18   Nat Christen, MD  ondansetron [facility]) 4 MG tablet Take 1 tablet (4 mg total) by mouth every 8 (eight) hours as needed for nausea or vomiting. 01/26/18   Vaughan Basta, Rona Ravens, NP  phenazopyridine (PYRIDIUM) 95 MG tablet Take 95 mg by mouth 3 (three) times daily as needed for pain.    [provider]  phenytoin  (DILANTIN) 200 MG ER capsule Take 1 capsule (200 mg total) by mouth daily. Patient not taking: Reported on 03/02/2018 08/27/17   Orson Eva, MD  traZODone (DESYREL) 50 MG tablet Take 50 mg by mouth at bedtime.    [provider]    Family History Family History  Problem Relation Age of Onset  . Diabetes Mother   . Heart failure Father   . Heart failure Maternal Aunt     Social History Social History   Tobacco Use  . Smoking status: Current Some Day Smoker    Packs/day: 0.25    Years: 50.00    Pack years: 12.50    Types: Cigarettes    Last attempt to quit: 01/13/2012    Years since quitting: 6.3  . Smokeless tobacco: Never Used  Substance Use Topics  . Alcohol use: No    Alcohol/week: 0.0 standard drinks  . Drug use: No     Allergies   Tape   Review of Systems Review of Systems  Neurological: Positive for facial asymmetry, speech difficulty and weakness.  All other systems reviewed and are negative.    Physical Exam Updated Vital Signs There were no vitals taken for this visit.  Physical Exam  Constitutional: She is oriented to person, place, and time. She appears well-developed and well-nourished. No distress.  HENT:  Head: Normocephalic and atraumatic.  Right Ear: Hearing normal.  Left Ear: Hearing normal.  Nose: Nose normal.  Mouth/Throat: Oropharynx is clear and moist and mucous membranes are normal.  Eyes: Pupils are equal, round, and reactive to light. Conjunctivae and EOM are normal.  Neck: Normal range of motion. Neck supple.  Cardiovascular: Regular rhythm, S1 normal and S2 normal. Exam reveals no gallop and no friction rub.  No murmur heard. Pulmonary/Chest: Effort normal and breath sounds normal. No respiratory distress. She exhibits no tenderness.  Abdominal: Soft. Normal appearance and bowel sounds are normal. There is no hepatosplenomegaly. There is no tenderness. There is no rebound, no guarding, no tenderness at McBurney's point and  negative Murphy's sign. No hernia.  Musculoskeletal: Normal range of motion.  Neurological: She is alert and oriented to person, place, and time. She has normal strength. A cranial nerve deficit (left facial droop) and sensory deficit (left hemiparesis) is present. She exhibits abnormal muscle tone (left side ataxia). Coordination  normal. GCS eye subscore is 4. GCS verbal subscore is 5. GCS motor subscore is 6.  Skin: Skin is warm, dry and intact. No rash noted. No cyanosis.  Psychiatric: She has a normal mood and affect. Her speech is normal and behavior is normal. Thought content normal.  Nursing note and vitals reviewed.    ED Treatments / Results  Labs (all labs ordered are listed, but only abnormal results are displayed) Labs Reviewed - No data to display  EKG None  Radiology No results found.  Procedures Procedures (including critical care time)  Medications Ordered in ED Medications - No data to display   Initial Impression / Assessment and Plan / ED Course  I have reviewed the triage vital signs and the nursing notes.  Pertinent labs & imaging results that were available during my care of the patient were reviewed by me and considered in my medical decision making (see chart for details).     She presents to the emergency department as a code stroke.  Patient noted to have left facial droop, speech disturbance and increased left-sided weakness approximately 30 minutes before arrival.  She does have a history of mild left sided symptoms from previous stroke.  Screening CT did not show any acute abnormality.  Patient evaluated by tele-neurology.  She was noted to be hypotensive at arrival.  She reports that usually her blood pressure is normal in the doctor's office.  She was given IV fluids with some improvement.  Patient underwent multiple studies directed by neurology.  CT angios head and neck, CT perfusion scan evaluated and no obvious clots noted.  Patient also  underwent CT chest abdomen and pelvis to rule out aortic dissection as a cause of her presentation.  She does not have any acute aortic abnormality.  Patient does have a history of cirrhosis and she appears to have ascites, possibly source of the patient's hypertension.  No evidence of infection noted.  As her blood pressure has improved, her symptoms have improved.  This is likely worsening of her chronic deficits secondary to the hypotension.  She will require hospitalization for further evaluation.  CRITICAL CARE Performed by: Orpah Greek   Total critical care time: 35 minutes  Critical care time was exclusive of separately billable procedures and treating other patients.  Critical care was necessary to treat or prevent imminent or life-threatening deterioration.  Critical care was time spent personally by me on the following activities: development of treatment plan with patient and/or surrogate as well as nursing, discussions with consultants, evaluation of patient's response to treatment, examination of patient, obtaining history from patient or surrogate, ordering and performing treatments and interventions, ordering and review of laboratory studies, ordering and review of radiographic studies, pulse oximetry and re-evaluation of patient's condition.   Final Clinical Impressions(s) / ED Diagnoses   Final diagnoses:  None    ED Discharge Orders    None       Kenadie Royce, Gwenyth Allegra, MD 05/07/18 [phone]

## 2018-05-08 DIAGNOSIS — G459 Transient cerebral ischemic attack, unspecified: Secondary | ICD-10-CM

## 2018-05-08 DIAGNOSIS — G40909 Epilepsy, unspecified, not intractable, without status epilepticus: Secondary | ICD-10-CM

## 2018-05-08 DIAGNOSIS — I10 Essential (primary) hypertension: Secondary | ICD-10-CM

## 2018-05-08 DIAGNOSIS — K219 Gastro-esophageal reflux disease without esophagitis: Secondary | ICD-10-CM

## 2018-05-08 DIAGNOSIS — E119 Type 2 diabetes mellitus without complications: Secondary | ICD-10-CM

## 2018-05-08 LAB — HEMOGLOBIN A1C
Hgb A1c MFr Bld: 5.4 % (ref 4.8–5.6)
Mean Plasma Glucose: 108.28 mg/dL

## 2018-05-08 LAB — GLUCOSE, CAPILLARY
Glucose-Capillary: 103 mg/dL — ABNORMAL HIGH (ref 70–99)
Glucose-Capillary: 100 mg/dL — ABNORMAL HIGH (ref 70–99)

## 2018-05-08 LAB — LIPID PANEL
CHOL/HDL RATIO: 2.5 ratio
CHOLESTEROL: 144 mg/dL (ref 0–200)
HDL: 57 mg/dL (ref >40)
LDL Cholesterol: 73 mg/dL (ref 0–99)
Triglycerides: 69 mg/dL (ref <150)
VLDL: 14 mg/dL (ref 0–40)

## 2018-05-08 MED ORDER — CLOPIDOGREL BISULFATE 75 MG PO TABS: 75.0000 mg | ORAL_TABLET | Freq: Every day | ORAL | 0 refills | Status: DC

## 2018-05-08 MED ORDER — LEVETIRACETAM 500 MG PO TABS: 500.0000 mg | ORAL_TABLET | Freq: Two times a day (BID) | ORAL | 0 refills | Status: DC

## 2018-05-08 MED ORDER — ATORVASTATIN CALCIUM 80 MG PO TABS: 80.0000 mg | ORAL_TABLET | Freq: Every day | ORAL | 1 refills | Status: DC

## 2018-05-08 MED ORDER — DILTIAZEM HCL ER COATED BEADS 120 MG PO CP24
120.0000 mg | ORAL_CAPSULE | Freq: Every day | ORAL | Status: DC
  Administered 2018-05-08: 120 mg via ORAL
  Filled 2018-05-08: qty 1

## 2018-05-08 MED ORDER — CARVEDILOL 12.5 MG PO TABS
12.5000 mg | ORAL_TABLET | Freq: Two times a day (BID) | ORAL | Status: DC
  Filled 2018-05-08: qty 1

## 2018-05-08 NOTE — Discharge Summary (Signed)
Physician Discharge Summary  [name] QDI:[id number] DOB: August 11, 1941 DOA: 05/07/2018  PCP: Redmond School, MD  Admit date: 05/07/2018 Discharge date: 05/08/2018  Admitted From: Home Disposition: Home  Recommendations for Outpatient Follow-up:  1. Follow up with PCP in 1-2 weeks 2. Please obtain BMP/CBC in one week 3. Patient has been referred for vascular surgery follow-up to address left carotid stenosis  Home Health: Home health PT, speech therapy Equipment/Devices:  Discharge Condition: Stable CODE STATUS: Full code Diet recommendation: Heart healthy  Brief/Interim Summary: 77 year old female admitted to the hospital with left-sided weakness and slurring of her speech.  She was noted to be hypotensive on arrival and had acute kidney injury.  Further work-up indicated acute CVA on the left side.  She has known left-sided carotid stenosis.  She was admitted for further evaluation.  Discharge Diagnoses:  Principal Problem:   TIA (transient ischemic attack) Active Problems:   CAD (coronary artery disease)   DM type 2 (diabetes mellitus, type 2) (HCC)   Hypercholesteremia   GERD (gastroesophageal reflux disease)   OSA (obstructive sleep apnea)   Aortic insufficiency   HTN (hypertension)   Thrombocytopenia (HCC)   Seizure disorder (HCC)   Acute CVA (cerebrovascular accident) ([place])  1. Acute CVA.  Patient found to have a left-sided CVA.  Seen by neurology who recommended dual antiplatelet therapy for the next 4 weeks followed by single agent therapy.  She is chronically on aspirin, and Plavix has been added to her regimen.  Lipid panel shows an LDL greater than 70.  Atorvastatin has been increased from 40 mg to 80 mg daily.  She was seen by physical therapy and speech therapy who recommended home health therapies.  Echocardiogram was unremarkable.  Imaging of her carotid system indicated 70% stenosis on the left.  This is been a chronic finding.  Per neurology, the stenosis is  likely become symptomatic and she will need vascular surgery referral.  Case was discussed with Dr. Oneida Alar on-call for vascular surgery who agrees with outpatient follow-up.  Referral has been made. 2. Acute kidney injury.  Related to dehydration.  This is resolved with IV fluids.  ACE inhibitor/hydrochlorothiazide have been discontinued.  NSAIDs of also been discontinued.  Blood pressures currently stable. 3. Seizure disorder.  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  Keppra dose was increased from 500 mg daily to twice daily.  Patient has not been on Dilantin for quite some time now. 4. History of paroxysmal supraventricular tachycardia.  She will be continued on diltiazem and Coreg on discharge. 5. Hyperlipidemia.  Continue on statin 6. GERD.  Continue on PPI  Discharge Instructions  Discharge Instructions    Ambulatory referral to Vascular Surgery   Complete by:  As directed    Patient with acute left stroke and left sided ICA stenosis 70%   Diet - low sodium heart healthy   Complete by:  As directed    Increase activity slowly   Complete by:  As directed      Allergies as of 05/08/2018      Reactions   Tape Rash, Other (See Comments)   Please use paper tape      Medication List    STOP taking these medications   hydrochlorothiazide 25 MG tablet Commonly known as:  HYDRODIURIL   lisinopril 20 MG tablet Commonly known as:  PRINIVIL,ZESTRIL   meloxicam 7.5 MG tablet Commonly known as:  MOBIC   pantoprazole 40 MG tablet Commonly known as:  PROTONIX  potassium chloride 10 MEQ CR capsule Commonly known as:  MICRO-K     TAKE these medications   aspirin EC 81 MG tablet Take 81 mg by mouth daily.   atorvastatin 80 MG tablet Commonly known as:  LIPITOR Take 1 tablet (80 mg total) by mouth daily. What changed:    medication strength  how much to take   carvedilol 12.5 MG tablet Commonly known as:  COREG Take 1 tablet (12.5 mg total) by  mouth 2 (two) times daily with a meal. What changed:  when to take this   clopidogrel 75 MG tablet Commonly known as:  PLAVIX Take 1 tablet (75 mg total) by mouth daily with breakfast. Start taking on:  05/09/2018   diltiazem 120 MG 24 hr capsule Commonly known as:  CARDIZEM CD Take 120 mg by mouth daily.   furosemide 20 MG tablet Commonly known as:  LASIX Take 20 mg by mouth daily as needed.   isosorbide mononitrate 30 MG 24 hr tablet Commonly known as:  IMDUR Take 30 mg by mouth daily.   levETIRAcetam 500 MG tablet Commonly known as:  KEPPRA Take 1 tablet (500 mg total) by mouth 2 (two) times daily. What changed:  when to take this   LORazepam 1 MG tablet Commonly known as:  ATIVAN Take 1 tablet by mouth daily as needed.   omeprazole 20 MG capsule Commonly known as:  PRILOSEC Take 1 capsule (20 mg total) by mouth daily.   ondansetron 4 MG tablet Commonly known as:  ZOFRAN Take 1 tablet (4 mg total) by mouth every 8 (eight) hours as needed for nausea or vomiting.   ranitidine 150 MG tablet Commonly known as:  ZANTAC Take 1 tablet by mouth 2 (two) times daily as needed.   SLEEP AID 25 MG Caps Generic drug:  diphenhydrAMINE HCl (Sleep) Take 1-2 capsules by mouth at bedtime as needed (for sleep).       Allergies  Allergen Reactions  . Tape Rash and Other (See Comments)    Please use paper tape    Consultations:  Neurology   Procedures/Studies: Ct Angio Head W Or Wo Contrast  Result Date: 05/07/2018 CLINICAL DATA:  77 y/o  F; slurred speech and left-sided weakness. EXAM: CT ANGIOGRAPHY HEAD AND NECK CT PERFUSION BRAIN TECHNIQUE: Multidetector CT imaging of the head and neck was performed using the standard protocol during bolus administration of intravenous contrast. Multiplanar CT image reconstructions and MIPs were obtained to evaluate the vascular anatomy. Carotid stenosis measurements (when applicable) are obtained utilizing NASCET criteria, using the  distal internal carotid diameter as the denominator. Multiphase CT imaging of the brain was performed following IV bolus contrast injection. Subsequent parametric perfusion maps were calculated using RAPID software. CONTRAST:  148m ISOVUE-370 IOPAMIDOL (ISOVUE-370) INJECTION 76% COMPARISON:  05/07/2018 CT head. 03/01/2017 CT angiogram head and neck. FINDINGS: CTA NECK FINDINGS Aortic arch: Standard branching. Imaged portion shows no evidence of aneurysm or dissection. No high-grade stenosis of the major arch vessel origins. Mild left subclavian artery origin stenosis with calcified plaque. Mild calcific atherosclerosis. Right carotid system: No evidence of dissection, stenosis (50% or greater) or occlusion. Mild calcific atherosclerosis. Left carotid system: Severe greater than 70% stenosis of the left carotid bifurcation with dense calcified plaque. No dissection, occlusion, or additional segment of stenosis. Vertebral arteries: Codominant. No evidence of dissection, stenosis (50% or greater) or occlusion. Mild calcific atherosclerosis. Skeleton: C3-4 grade 1 retrolisthesis. C4-5 grade 1 anterolisthesis. Severe discogenic degenerative changes at the C3-4 and  C5-C7 levels. Prominent upper cervical facet arthropathy. No high-grade bony canal stenosis. Other neck: 10 mm nodule in the left lobe of the thyroid. Upper chest: Please refer to the concurrent CT angiogram of the chest. Review of the MIP images confirms the above findings CTA HEAD FINDINGS Anterior circulation: No significant stenosis, proximal occlusion, aneurysm, or vascular malformation. Calcified plaque of carotid siphons with mild less than 50% bilateral paraclinoid stenosis. Posterior circulation: Left vertebral artery calcified plaque with mild less than 50% stenosis. Stable moderate to severe right P2 stenosis. No large vessel occlusion, aneurysm, or additional segment of high-grade stenosis. Venous sinuses: As permitted by contrast timing, patent.  Anatomic variants: None significant. Review of the MIP images confirms the above findings CT Brain Perfusion Findings: CBF (<30%) Volume: 528m Perfusion (Tmax>6.0s) volume: 0728mMismatch Volume: 28m27mnfarction Location:Not applicable. IMPRESSION: 1. Normal CT brain perfusion. 2. No large vessel occlusion, aneurysm, or vascular malformation. 3. Stable severe left proximal ICA stenosis with calcified plaque. 4. Stable mild left subclavian artery origin stenosis with calcified plaque. 5. Stable right P2 segment moderate to severe stenosis. 6. Stable left vertebral artery mild stenosis. 7. Stable bilateral paraclinoid ICA mild stenosis. 8. Stable advanced cervical spondylosis with severe discogenic degenerative changes at the C3 through C7 levels. 9. Calcific atherosclerosis of aorta, carotid systems, and vertebral arteries. These results were called by telephone at the time of interpretation on 05/07/2018 at 2:51 am to Dr. PolBetsey Holidayho verbally acknowledged these results. Electronically Signed   By: LanKristine GarbeD.   On: 05/07/2018 02:50   Ct Head Wo Contrast  Result Date: 04/22/2018 CLINICAL DATA:  Headaches EXAM: CT HEAD WITHOUT CONTRAST TECHNIQUE: Contiguous axial images were obtained from the base of the skull through the vertex without intravenous contrast. COMPARISON:  08/24/2017 FINDINGS: Brain: Old right frontal infarct with encephalomalacia, stable. There is atrophy and chronic small vessel disease changes. No acute intracranial abnormality. Specifically, no hemorrhage, hydrocephalus, mass lesion, acute infarction, or significant intracranial injury. Old right basal ganglia lacunar infarcts, stable. Vascular: No hyperdense vessel or unexpected calcification. Skull: No acute calvarial abnormality. Sinuses/Orbits: Visualized paranasal sinuses and mastoids clear. Orbital soft tissues unremarkable. Other: None IMPRESSION: Old right frontal and basal ganglia infarcts. Atrophy, chronic small vessel  disease. No acute intracranial abnormality. Electronically Signed   By: KevRolm BaptiseD.   On: 04/22/2018 14:29   Ct Angio Neck W Or Wo Contrast  Result Date: 05/07/2018 CLINICAL DATA:  76 62o  F; slurred speech and left-sided weakness. EXAM: CT ANGIOGRAPHY HEAD AND NECK CT PERFUSION BRAIN TECHNIQUE: Multidetector CT imaging of the head and neck was performed using the standard protocol during bolus administration of intravenous contrast. Multiplanar CT image reconstructions and MIPs were obtained to evaluate the vascular anatomy. Carotid stenosis measurements (when applicable) are obtained utilizing NASCET criteria, using the distal internal carotid diameter as the denominator. Multiphase CT imaging of the brain was performed following IV bolus contrast injection. Subsequent parametric perfusion maps were calculated using RAPID software. CONTRAST:  175m82mOVUE-370 IOPAMIDOL (ISOVUE-370) INJECTION 76% COMPARISON:  05/07/2018 CT head. 03/01/2017 CT angiogram head and neck. FINDINGS: CTA NECK FINDINGS Aortic arch: Standard branching. Imaged portion shows no evidence of aneurysm or dissection. No high-grade stenosis of the major arch vessel origins. Mild left subclavian artery origin stenosis with calcified plaque. Mild calcific atherosclerosis. Right carotid system: No evidence of dissection, stenosis (50% or greater) or occlusion. Mild calcific atherosclerosis. Left carotid system: Severe greater than 70% stenosis of the left carotid bifurcation with  dense calcified plaque. No dissection, occlusion, or additional segment of stenosis. Vertebral arteries: Codominant. No evidence of dissection, stenosis (50% or greater) or occlusion. Mild calcific atherosclerosis. Skeleton: C3-4 grade 1 retrolisthesis. C4-5 grade 1 anterolisthesis. Severe discogenic degenerative changes at the C3-4 and C5-C7 levels. Prominent upper cervical facet arthropathy. No high-grade bony canal stenosis. Other neck: 10 mm nodule in the left  lobe of the thyroid. Upper chest: Please refer to the concurrent CT angiogram of the chest. Review of the MIP images confirms the above findings CTA HEAD FINDINGS Anterior circulation: No significant stenosis, proximal occlusion, aneurysm, or vascular malformation. Calcified plaque of carotid siphons with mild less than 50% bilateral paraclinoid stenosis. Posterior circulation: Left vertebral artery calcified plaque with mild less than 50% stenosis. Stable moderate to severe right P2 stenosis. No large vessel occlusion, aneurysm, or additional segment of high-grade stenosis. Venous sinuses: As permitted by contrast timing, patent. Anatomic variants: None significant. Review of the MIP images confirms the above findings CT Brain Perfusion Findings: CBF (<30%) Volume: 25m Perfusion (Tmax>6.0s) volume: 071mMismatch Volume: 15m57mnfarction Location:Not applicable. IMPRESSION: 1. Normal CT brain perfusion. 2. No large vessel occlusion, aneurysm, or vascular malformation. 3. Stable severe left proximal ICA stenosis with calcified plaque. 4. Stable mild left subclavian artery origin stenosis with calcified plaque. 5. Stable right P2 segment moderate to severe stenosis. 6. Stable left vertebral artery mild stenosis. 7. Stable bilateral paraclinoid ICA mild stenosis. 8. Stable advanced cervical spondylosis with severe discogenic degenerative changes at the C3 through C7 levels. 9. Calcific atherosclerosis of aorta, carotid systems, and vertebral arteries. These results were called by telephone at the time of interpretation on 05/07/2018 at 2:51 am to Dr. PolBetsey Holidayho verbally acknowledged these results. Electronically Signed   By: LanKristine GarbeD.   On: 05/07/2018 02:50   Mr Brain Wo Contrast  Result Date: 05/07/2018 CLINICAL DATA:  [age]ar old female with slurred speech and left side weakness. Severe left ICA and right PCA P2 stenosis. EXAM: MRI HEAD WITHOUT CONTRAST TECHNIQUE: Multiplanar, multiecho pulse  sequences of the brain and surrounding structures were obtained without intravenous contrast. COMPARISON:  CT head and CTA and CTP head and neck earlier today. Brain MRI 03/01/2017. FINDINGS: Brain: Punctate left peri rolandic, superior motor strip region, cortex focus of restricted diffusion (series 4, image 89) with faint T2 and FLAIR hyperintensity. No hemorrhage or mass effect. No other restricted diffusion. Chronic right MCA, operculum infarct with encephalomalacia is stable since 2018. Occasional scattered small cerebral white matter T2 and FLAIR hyperintense foci elsewhere stable. Several small chronic infarcts in the right cerebellum are stable. Chronic left thalamic and basal ganglia lacunar infarcts re-demonstrated. No hemosiderin. No midline shift, mass effect, evidence of mass lesion, ventriculomegaly, extra-axial collection or acute intracranial hemorrhage. Cervicomedullary junction and pituitary are within normal limits. Vascular: Major intracranial vascular flow voids are stable since 2018. Skull and upper cervical spine: Stable visible cervical spine. Visualized bone marrow signal is within normal limits. Sinuses/Orbits: Stable and negative. Other: Mild right mastoid effusion has regressed since 2018. Trace left mastoid fluid is stable. Visible internal auditory structures appear normal. Negative scalp and face soft tissues. IMPRESSION: 1. Punctate acute lacunar infarct in the superior left peri rolandic cortex with no hemorrhage or mass effect. If symptomatic, right side weakness would be expected. 2. Otherwise stable chronic ischemic disease since 2018. Electronically Signed   By: H  Genevie AnnD.   On: 05/07/2018 07:47   Us Korearotid Bilateral  Result Date: 04/23/2018 CLINICAL  DATA:  Carotid bruit and history of stroke, hypertension, coronary artery disease, hyperlipidemia and diabetes. EXAM: BILATERAL CAROTID DUPLEX ULTRASOUND TECHNIQUE: Pearline Cables scale imaging, color Doppler and duplex ultrasound were  performed of bilateral carotid and vertebral arteries in the neck. COMPARISON:  04/10/2016 FINDINGS: Criteria: Quantification of carotid stenosis is based on velocity parameters that correlate the residual internal carotid diameter with NASCET-based stenosis levels, using the diameter of the distal internal carotid lumen as the denominator for stenosis measurement. The following velocity measurements were obtained: RIGHT ICA:  100/19 cm/sec CCA:  40/9 cm/sec SYSTOLIC ICA/CCA RATIO:  1.3 ECA:  88 cm/sec LEFT ICA:  199/24 cm/sec CCA:  73/5 cm/sec SYSTOLIC ICA/CCA RATIO:  2.1 ECA:  145 cm/sec RIGHT CAROTID ARTERY: Stable calcified plaque the level of the distal common carotid artery, carotid bulb and right ICA origin. Estimated right ICA stenosis remains less than 50%. RIGHT VERTEBRAL ARTERY: Antegrade flow with normal waveform and velocity. LEFT CAROTID ARTERY: Stable moderate amount predominately calcified plaque at the level of the carotid bulb and proximal left ICA. Turbulent flow and elevated velocities present with estimated left ICA stenosis of 50-69%. LEFT VERTEBRAL ARTERY: Antegrade flow with normal waveform and velocity. IMPRESSION: Stable carotid duplex ultrasound with bilateral calcified plaque and estimated right ICA stenosis of less than 50% and left ICA stenosis of 50-69%. Electronically Signed   By: Aletta Edouard M.D.   On: 04/23/2018 08:19   Ct Cerebral Perfusion W Contrast  Result Date: 05/07/2018 CLINICAL DATA:  77 y/o  F; slurred speech and left-sided weakness. EXAM: CT ANGIOGRAPHY HEAD AND NECK CT PERFUSION BRAIN TECHNIQUE: Multidetector CT imaging of the head and neck was performed using the standard protocol during bolus administration of intravenous contrast. Multiplanar CT image reconstructions and MIPs were obtained to evaluate the vascular anatomy. Carotid stenosis measurements (when applicable) are obtained utilizing NASCET criteria, using the distal internal carotid diameter as the  denominator. Multiphase CT imaging of the brain was performed following IV bolus contrast injection. Subsequent parametric perfusion maps were calculated using RAPID software. CONTRAST:  152m ISOVUE-370 IOPAMIDOL (ISOVUE-370) INJECTION 76% COMPARISON:  05/07/2018 CT head. 03/01/2017 CT angiogram head and neck. FINDINGS: CTA NECK FINDINGS Aortic arch: Standard branching. Imaged portion shows no evidence of aneurysm or dissection. No high-grade stenosis of the major arch vessel origins. Mild left subclavian artery origin stenosis with calcified plaque. Mild calcific atherosclerosis. Right carotid system: No evidence of dissection, stenosis (50% or greater) or occlusion. Mild calcific atherosclerosis. Left carotid system: Severe greater than 70% stenosis of the left carotid bifurcation with dense calcified plaque. No dissection, occlusion, or additional segment of stenosis. Vertebral arteries: Codominant. No evidence of dissection, stenosis (50% or greater) or occlusion. Mild calcific atherosclerosis. Skeleton: C3-4 grade 1 retrolisthesis. C4-5 grade 1 anterolisthesis. Severe discogenic degenerative changes at the C3-4 and C5-C7 levels. Prominent upper cervical facet arthropathy. No high-grade bony canal stenosis. Other neck: 10 mm nodule in the left lobe of the thyroid. Upper chest: Please refer to the concurrent CT angiogram of the chest. Review of the MIP images confirms the above findings CTA HEAD FINDINGS Anterior circulation: No significant stenosis, proximal occlusion, aneurysm, or vascular malformation. Calcified plaque of carotid siphons with mild less than 50% bilateral paraclinoid stenosis. Posterior circulation: Left vertebral artery calcified plaque with mild less than 50% stenosis. Stable moderate to severe right P2 stenosis. No large vessel occlusion, aneurysm, or additional segment of high-grade stenosis. Venous sinuses: As permitted by contrast timing, patent. Anatomic variants: None significant.  Review of the  MIP images confirms the above findings CT Brain Perfusion Findings: CBF (<30%) Volume: 89m Perfusion (Tmax>6.0s) volume: 056mMismatch Volume: 76m59mnfarction Location:Not applicable. IMPRESSION: 1. Normal CT brain perfusion. 2. No large vessel occlusion, aneurysm, or vascular malformation. 3. Stable severe left proximal ICA stenosis with calcified plaque. 4. Stable mild left subclavian artery origin stenosis with calcified plaque. 5. Stable right P2 segment moderate to severe stenosis. 6. Stable left vertebral artery mild stenosis. 7. Stable bilateral paraclinoid ICA mild stenosis. 8. Stable advanced cervical spondylosis with severe discogenic degenerative changes at the C3 through C7 levels. 9. Calcific atherosclerosis of aorta, carotid systems, and vertebral arteries. These results were called by telephone at the time of interpretation on 05/07/2018 at 2:51 am to Dr. PolBetsey Holidayho verbally acknowledged these results. Electronically Signed   By: LanKristine GarbeD.   On: 05/07/2018 02:50   Mr MraJodene Namad/brain Wo QT  Result Date: 05/07/2018 CLINICAL DATA:  [age]ar old female with slurred speech and left side weakness. Severe left ICA and right PCA P2 stenosis. EXAM: MRA HEAD WITHOUT CONTRAST TECHNIQUE: Angiographic images of the Circle of Willis were obtained using MRA technique without intravenous contrast. COMPARISON:  Brain MRI today reported separately. CTA head and neck earlier today. Intracranial MRA 05/05/2016. FINDINGS: Stable antegrade flow in the posterior circulation with codominant distal vertebral arteries. Both PICA origins remain patent. Patent basilar artery. No distal vertebral or basilar stenosis. SCA and PCA origins remain normal. Chronic short segment moderate to severe right PCA P1/P2 junction stenosis is stable since 2017 (series 13, image 28). Otherwise stable an normal PCA branches. Posterior communicating arteries are diminutive or absent. Stable and symmetric  appearing antegrade flow in both ICA siphons. No siphon stenosis identified. Ophthalmic artery origins appear stable and within normal limits. Normal carotid termini, MCA and ACA origins. Anterior communicating artery and visible bilateral ACA branches are stable and normal. Right MCA M1 segment, right MCA bifurcation and visible right MCA branches are stable and within normal limits. Left MCA M1 segment and bifurcation are patent without stenosis. However, left MCA posterior division M2 and M3 branches appear diffusely irregular and attenuated compared to the right side and compared to the MRA appearance in 2017. But additionally, this finding was not present on the CTA earlier today where the same branches appeared within normal limits. Evaluation of the MRA source images suggests this may be artifact. IMPRESSION: 1. Suggestion of new irregularity of the left MCA posterior M2 and M3 branches with attenuated flow, however, given that these same branches appeared normal on the CTA 0209 hours today, and that multiple branches are seemingly affected, I favor this is artifact. 2. Otherwise stable intracranial MRA since 2017: - remaining anterior circulation is negative. - chronic moderate to severe right MCA P1/P2 junction stenosis. Electronically Signed   By: H  Genevie AnnD.   On: 05/07/2018 07:54   Ct Angio Chest/abd/pel For Dissection W And/or W/wo  Result Date: 05/07/2018 CLINICAL DATA:  [age]ar old female with slurred speech and left-sided weakness. Evaluate for aortic dissection. EXAM: CT ANGIOGRAPHY CHEST, ABDOMEN AND PELVIS TECHNIQUE: Multidetector CT imaging through the chest, abdomen and pelvis was performed using the standard protocol during bolus administration of intravenous contrast. Multiplanar reconstructed images and MIPs were obtained and reviewed to evaluate the vascular anatomy. CONTRAST:  175m63mOVUE-370 IOPAMIDOL (ISOVUE-370) INJECTION 76% COMPARISON:  CT of the abdomen pelvis dated 01/11/2018  FINDINGS: CTA CHEST FINDINGS Cardiovascular: There is no cardiomegaly or pericardial effusion. Multi vessel coronary vascular calcification as  well as calcification of the mitral annulus. There is mild atherosclerotic calcification of the thoracic aorta. No aneurysmal dilatation or dissection. The origins of the great vessels of the aortic arch are patent. The central pulmonary arteries are unremarkable as visualized. Mediastinum/Nodes: No hilar or mediastinal adenopathy. Mildly thickened distal esophagus likely related to cirrhosis. This appearance is similar to prior CT. An infiltrative process or neoplasm is less likely. The thyroid gland is grossly unremarkable. No mediastinal fluid collection. Lungs/Pleura: The lungs are clear. There is no pleural effusion or pneumothorax. The central airways are patent. Musculoskeletal: No chest wall abnormality. No acute or significant osseous findings. Review of the MIP images confirms the above findings. CTA ABDOMEN AND PELVIS FINDINGS VASCULAR Aorta: There is advanced atherosclerotic calcification of the aorta. There is a 2 cm infrarenal aortic ectasia. No aneurysm or dissection. Celiac: There is atherosclerotic calcification and narrowing of the origin of the celiac axis. The celiac artery however remains patent. SMA: Atherosclerotic calcification of the origin of the SMA. The SMA is patent. Renals: There is atherosclerotic calcification of the ostia of the renal arteries. The renal arteries are patent. 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 IMA: Patent without evidence of aneurysm, dissection, vasculitis or significant stenosis. Inflow: Atherosclerotic calcification of the iliac vasculature. The TI vessels however are patent. Veins: The IVC appears unremarkable. The SMV, splenic vein, and visualized portions of the main portal vein are patent. No portal venous gas. Review of the MIP images confirms the above findings. NON-VASCULAR No intra-abdominal free air.  Small  perihepatic ascites. Hepatobiliary: Cirrhosis. No biliary ductal dilatation. The gallbladder is unremarkable. Pancreas: Unremarkable. No pancreatic ductal dilatation or surrounding inflammatory changes. Spleen: Normal in size without focal abnormality. Adrenals/Urinary Tract: The adrenal glands are unremarkable. There is a 2 cm focal hypodense/nonenhancing area in the medial interpolar aspect of the left kidney, likely sequela of prior infarct versus less likely a cyst. Subcentimeter right renal hypodense lesion is too small to characterize. There is no hydronephrosis on either side. There is symmetric enhancement and excretion of contrast by both kidneys. The urinary bladder is grossly unremarkable as visualized. Stomach/Bowel: There is no bowel obstruction or active inflammation. Appendectomy. Lymphatic: No adenopathy. Reproductive: Hysterectomy.  No pelvic mass. Other: Upper abdominal varices including esophageal, gastric, and perisplenic varices. Musculoskeletal: Osteopenia. Partially visualized left femoral intramedullary rod and transcervical screw. Old left femoral neck fracture which is incompletely healed. Old-appearing fracture of the left pubic ramus at the symphysis pubis. Bilateral L5 pars defects with grade 1 L5-S1 anterolisthesis. No acute osseous pathology. Review of the MIP images confirms the above findings. IMPRESSION: 1. No acute intrathoracic, abdominal, or pelvic pathology. No aortic aneurysm or dissection. A 2 cm infrarenal aortic ectasia. 2. Cirrhosis with evidence of portal hypertension, ascites, and upper abdominal varices. 3. Thickened appearance of the distal esophagus, similar to prior CT, and likely related to cirrhosis and esophageal varices. 4.  Aortic Atherosclerosis (ICD10-I70.0). 5. Additional nonacute and nonemergent findings as described above. Electronically Signed   By: Anner Crete M.D.   On: 05/07/2018 02:52   Ct Head Code Stroke Wo Contrast  Result Date:  05/07/2018 CLINICAL DATA:  Code stroke. 77 y/o F; left-sided weakness and slurred speech. EXAM: CT HEAD WITHOUT CONTRAST TECHNIQUE: Contiguous axial images were obtained from the base of the skull through the vertex without intravenous contrast. COMPARISON:  04/12/2018 CT head. FINDINGS: Brain: No evidence of acute infarction, hemorrhage, hydrocephalus, extra-axial collection or mass lesion/mass effect. Stable chronic infarction of the right  lateral frontal lobe and superior insula. Multiple small chronic infarctions are present in the bilateral caudate heads, anterior limbs of internal capsule, bilateral lentiform nuclei, and bilateral thalami. Additionally, there is a small chronic infarction in the right inferior cerebellar hemisphere. Stable mild volume loss of the brain. Vascular: Calcific atherosclerosis of carotid siphons. No proximal hyperdense vessel identified. Skull: Normal. Negative for fracture or focal lesion. Sinuses/Orbits: No acute finding. Other: Bilateral intra-ocular lens replacement. ASPECTS [facility] Stroke Program Early CT Score) - Ganglionic level infarction (caudate, lentiform nuclei, internal capsule, insula, M1-M3 cortex): 7 - Supraganglionic infarction (M4-M6 cortex): 3 Total score (0-10 with 10 being normal): 10 IMPRESSION: 1. No acute intracranial abnormality identified. 2. Stable chronic infarction of the right lateral frontal lobe and superior insula. Stable multiple small chronic infarctions in the basal ganglia and right inferior cerebellum. Stable mild volume loss of the brain. 3. ASPECTS is 10 These results were called by telephone at the time of interpretation on 05/07/2018 at 12:29 am to Dr. Joseph Berkshire , who verbally acknowledged these results. Electronically Signed   By: Kristine Garbe M.D.   On: 05/07/2018 00:32       Subjective: Feeling better today.  No new complaints.  Wants to go home.  Discharge Exam: Vitals:   05/07/18 2021 05/08/18 0015  05/08/18 0417 05/08/18 0815  BP: (!) 155/48 (!) 143/61 (!) 131/51 (!) 155/78  Pulse: 99 100 92 100  Resp: 19 18  18   Temp: 99.7 F (37.6 C) 99.4 F (37.4 C) 98.7 F (37.1 C) 99.1 F (37.3 C)  TempSrc: Oral Oral Oral Oral  SpO2: 97% 98% 95% 96%    General: Pt is alert, awake, not in acute distress Cardiovascular: RRR, S1/S2 +, no rubs, no gallops Respiratory: CTA bilaterally, no wheezing, no rhonchi Abdominal: Soft, NT, ND, bowel sounds + Extremities: no edema, no cyanosis    The results of significant diagnostics from this hospitalization (including imaging, microbiology, ancillary and laboratory) are listed below for reference.     Microbiology: No results found for this or any previous visit (from the past 240 hour(s)).   Labs: BNP (last 3 results) No results for input(s): BNP in the last 8760 hours. Basic Metabolic Panel: Recent Labs  Lab 05/07/18 0033 05/07/18 1310  NA 141 143  K 3.9 4.5  CL 111 114*  CO2 20* 25  GLUCOSE 173* 162*  BUN 20 16  CREATININE 1.47* 0.90  CALCIUM 8.8* 8.6*   Liver Function Tests: Recent Labs  Lab 05/07/18 0033  AST 49*  ALT 34  ALKPHOS 94  BILITOT 0.6  PROT 5.5*  ALBUMIN 3.1*   No results for input(s): LIPASE, AMYLASE in the last 168 hours. No results for input(s): AMMONIA in the last 168 hours. CBC: Recent Labs  Lab 05/07/18 0033  WBC 5.6  NEUTROABS 3.0  HGB 10.7*  HCT 33.0*  MCV 96.2  PLT 113*   Cardiac Enzymes: Recent Labs  Lab 05/07/18 0033 05/07/18 0746 05/07/18 1310  TROPONINI 0.04* 0.07* 0.13*   BNP: Invalid input(s): POCBNP CBG: Recent Labs  Lab 05/07/18 0030 05/07/18 2047 05/08/18 0818 05/08/18 1145  GLUCAP 173* 137* 103* 100*   D-Dimer No results for input(s): DDIMER in the last 72 hours. Hgb A1c Recent Labs    05/08/18 0637  HGBA1C 5.4   Lipid Profile Recent Labs    05/08/18 0637  CHOL 144  HDL 57  LDLCALC 73  TRIG 69  CHOLHDL 2.5   Thyroid function studies No results  for input(s): TSH, T4TOTAL, T3FREE, THYROIDAB in the last 72 hours.  Invalid input(s): FREET3 Anemia work up No results for input(s): VITAMINB12, FOLATE, FERRITIN, TIBC, IRON, RETICCTPCT in the last 72 hours. Urinalysis    Component Value Date/Time   COLORURINE YELLOW 05/07/2018 0252   APPEARANCEUR CLEAR 05/07/2018 0252   LABSPEC 1.025 05/07/2018 0252   PHURINE 5.0 05/07/2018 0252   GLUCOSEU NEGATIVE 05/07/2018 0252   HGBUR NEGATIVE 05/07/2018 0252   BILIRUBINUR NEGATIVE 05/07/2018 0252   KETONESUR NEGATIVE 05/07/2018 0252   PROTEINUR NEGATIVE 05/07/2018 0252   UROBILINOGEN 0.2 07/09/2015 0930   NITRITE NEGATIVE 05/07/2018 0252   LEUKOCYTESUR NEGATIVE 05/07/2018 0252   Sepsis Labs Invalid input(s): PROCALCITONIN,  WBC,  LACTICIDVEN Microbiology No results found for this or any previous visit (from the past 240 hour(s)).   Time coordinating discharge: 49mns  SIGNED:   JKathie Dike MD  [facility] 05/08/2018, 12:00 PM Pager   If 7PM-7AM, please contact night-coverage [email or web address] Password [email or web address]

## 2018-05-08 NOTE — Progress Notes (Signed)
Physical Therapy Treatment Patient Details Name: [name] MRN: [id number] DOB: 02/08/1941 Today's Date: 05/08/2018    History of Present Illness [name] is a 77 y.o. female with medical history significant of anemia, aortic insufficiency, back pain/DDD, bilateral carotid stenosis, liver cirrhosis due to NASH, recent closed left femoral fracture, CAD, history of NSTEMI, recent stent placement, hypertension, frequent falls, GERD,/hiatal hernia, hyperlipidemia, IBS, OSA not on CPAP, pericardial effusion, history of paroxysmal SVT, recurrent UTIs, seizures on phenytoin, type 2 diabetes, recent ORIF for left femoral fracture, history of CVA who is coming to the emergency department due to slurred speech and left-sided weakness shortly after going to sleep at about 20-30 last night.  She mentioned that the weakness on the left side is different from what affected her on her previous CVA.  She denies headache, blurred vision, dizziness, tinnitus or numbness.  No fever, chills, sore throat, dyspnea, wheezing, hemoptysis, recent chest pain, palpitations, diaphoresis, PND or orthopnea.  She has had mild pitting edema of her LLE due to recent surgery.  Denies nausea, emesis, abdominal pain, diarrhea constipation, melena or hematochezia.  Denies dysuria, frequency or hematuria.  No heat or cold intolerance.  No polyuria, polydipsia, polyphagia.    PT Comments    Patient demonstrates improved balance for sit to stands, transfers and gait training in hallway, c/o minor discomfort/pain behind left knee while walking, but no loss of balance and tolerated sitting up at bedside talking to MD after therapy.  Patient will benefit from continued physical therapy in hospital and recommended venue below to increase strength, balance, endurance for safe ADLs and gait.    Follow Up Recommendations  Home health PT;Supervision/Assistance - 24 hour     Equipment Recommendations  None recommended by PT     Recommendations for Other Services       Precautions / Restrictions Precautions Precautions: Fall Restrictions Weight Bearing Restrictions: No    Mobility  Bed Mobility Overal bed mobility: Modified Independent                Transfers Overall transfer level: Needs assistance Equipment used: Rolling walker (2 wheeled) Transfers: Sit to/from [employer] Transfers Sit to Stand: Supervision Stand pivot transfers: Supervision       General transfer comment: slightly labored, requires less assistance  Ambulation/Gait   Gait Distance (Feet): 45 Feet Assistive device: Rolling walker (2 wheeled) Gait Pattern/deviations: Decreased step length - right;Decreased step length - left;Decreased stride length Gait velocity: decreased   General Gait Details: slow slightly labored cadence with decreased left ankle dorsiflexion, no loss of balance, required less assistance, but c/o mild discomfort pain back of left knee while walking   Stairs             Wheelchair Mobility    Modified Rankin (Stroke Patients Only)       Balance Overall balance assessment: Needs assistance Sitting-balance support: Feet supported;No upper extremity supported Sitting balance-Leahy Scale: Good     Standing balance support: Bilateral upper extremity supported;During functional activity Standing balance-Leahy Scale: Fair                              Cognition Arousal/Alertness: Awake/alert Behavior During Therapy: WFL for tasks assessed/performed Overall Cognitive Status: Within Functional Limits for tasks assessed  Exercises General Exercises - Lower Extremity Long Arc Quad: Seated;AROM;Strengthening;Both;10 reps Hip Flexion/Marching: Seated;Strengthening;10 reps;AROM;Both Toe Raises: Seated;Strengthening;10 reps;AROM;Both Heel Raises: Seated;Strengthening;Both;10 reps;AROM    General Comments         Pertinent Vitals/Pain Pain Assessment: No/denies pain    Home Living                      Prior Function            PT Goals (current goals can now be found in the care plan section) Acute Rehab PT Goals Patient Stated Goal: return home with family to assist PT Goal Formulation: With patient Time For Goal Achievement: 05/14/18 Potential to Achieve Goals: Good Progress towards PT goals: Progressing toward goals    Frequency    7X/week      PT Plan Current plan remains appropriate    Co-evaluation              AM-PAC PT "6 Clicks" Daily Activity  Outcome Measure  Difficulty turning over in bed (including adjusting bedclothes, sheets and blankets)?: None Difficulty moving from lying on back to sitting on the side of the bed? : None Difficulty sitting down on and standing up from a chair with arms (e.g., wheelchair, bedside commode, etc,.)?: None Help needed moving to and from a bed to chair (including a wheelchair)?: A Little Help needed walking in hospital room?: A Little Help needed climbing 3-5 steps with a railing? : A Little 6 Click Score: 21    End of Session   Activity Tolerance: Patient tolerated treatment well;Patient limited by fatigue Patient left: with call bell/phone within reach;in bed;Other (comment)(seated at bedside with MD in room) Nurse Communication: Mobility status PT Visit Diagnosis: Unsteadiness on feet (R26.81);Other abnormalities of gait and mobility (R26.89);Muscle weakness (generalized) (M62.81)     Time: 9021-1155 PT Time Calculation (min) (ACUTE ONLY): 23 min  Charges:  $Therapeutic Activity: 23-37 mins                     1:03 PM, 05/08/18 Lonell Grandchild, MPT Physical Therapist with [facility] [phone] office [phone] mobile phone

## 2018-05-08 NOTE — Progress Notes (Signed)
3 IVs removed, 2x2 gauze and paper tape applied to sites, patient tolerated well.  Reivewed AVS with patient and patient's son, [name].  Patient transported home by son.

## 2018-05-09 DIAGNOSIS — Z87891 Personal history of nicotine dependence: Secondary | ICD-10-CM | POA: Diagnosis not present

## 2018-05-09 DIAGNOSIS — Z794 Long term (current) use of insulin: Secondary | ICD-10-CM | POA: Diagnosis not present

## 2018-05-09 DIAGNOSIS — E1151 Type 2 diabetes mellitus with diabetic peripheral angiopathy without gangrene: Secondary | ICD-10-CM | POA: Diagnosis not present

## 2018-05-09 DIAGNOSIS — Z7982 Long term (current) use of aspirin: Secondary | ICD-10-CM | POA: Diagnosis not present

## 2018-05-09 DIAGNOSIS — M1991 Primary osteoarthritis, unspecified site: Secondary | ICD-10-CM | POA: Diagnosis not present

## 2018-05-09 DIAGNOSIS — Z6823 Body mass index (BMI) 23.0-23.9, adult: Secondary | ICD-10-CM | POA: Diagnosis not present

## 2018-05-09 DIAGNOSIS — K5900 Constipation, unspecified: Secondary | ICD-10-CM | POA: Diagnosis not present

## 2018-05-09 DIAGNOSIS — G894 Chronic pain syndrome: Secondary | ICD-10-CM | POA: Diagnosis not present

## 2018-05-09 DIAGNOSIS — Z7902 Long term (current) use of antithrombotics/antiplatelets: Secondary | ICD-10-CM | POA: Diagnosis not present

## 2018-05-09 DIAGNOSIS — R600 Localized edema: Secondary | ICD-10-CM | POA: Diagnosis not present

## 2018-05-09 DIAGNOSIS — Z8673 Personal history of transient ischemic attack (TIA), and cerebral infarction without residual deficits: Secondary | ICD-10-CM | POA: Diagnosis not present

## 2018-05-09 DIAGNOSIS — R627 Adult failure to thrive: Secondary | ICD-10-CM | POA: Diagnosis not present

## 2018-05-09 DIAGNOSIS — M5136 Other intervertebral disc degeneration, lumbar region: Secondary | ICD-10-CM | POA: Diagnosis not present

## 2018-05-09 DIAGNOSIS — G4709 Other insomnia: Secondary | ICD-10-CM | POA: Diagnosis not present

## 2018-05-09 DIAGNOSIS — I10 Essential (primary) hypertension: Secondary | ICD-10-CM | POA: Diagnosis not present

## 2018-05-10 DIAGNOSIS — Z7982 Long term (current) use of aspirin: Secondary | ICD-10-CM | POA: Diagnosis not present

## 2018-05-10 DIAGNOSIS — Z87891 Personal history of nicotine dependence: Secondary | ICD-10-CM | POA: Diagnosis not present

## 2018-05-10 DIAGNOSIS — Z6823 Body mass index (BMI) 23.0-23.9, adult: Secondary | ICD-10-CM | POA: Diagnosis not present

## 2018-05-10 DIAGNOSIS — Z7902 Long term (current) use of antithrombotics/antiplatelets: Secondary | ICD-10-CM | POA: Diagnosis not present

## 2018-05-10 DIAGNOSIS — Z8673 Personal history of transient ischemic attack (TIA), and cerebral infarction without residual deficits: Secondary | ICD-10-CM | POA: Diagnosis not present

## 2018-05-10 DIAGNOSIS — E1151 Type 2 diabetes mellitus with diabetic peripheral angiopathy without gangrene: Secondary | ICD-10-CM | POA: Diagnosis not present

## 2018-05-10 DIAGNOSIS — Z794 Long term (current) use of insulin: Secondary | ICD-10-CM | POA: Diagnosis not present

## 2018-05-10 DIAGNOSIS — I10 Essential (primary) hypertension: Secondary | ICD-10-CM | POA: Diagnosis not present

## 2018-05-10 DIAGNOSIS — R600 Localized edema: Secondary | ICD-10-CM | POA: Diagnosis not present

## 2018-05-10 DIAGNOSIS — M1991 Primary osteoarthritis, unspecified site: Secondary | ICD-10-CM | POA: Diagnosis not present

## 2018-05-10 DIAGNOSIS — K5900 Constipation, unspecified: Secondary | ICD-10-CM | POA: Diagnosis not present

## 2018-05-10 DIAGNOSIS — G894 Chronic pain syndrome: Secondary | ICD-10-CM | POA: Diagnosis not present

## 2018-05-10 DIAGNOSIS — M5136 Other intervertebral disc degeneration, lumbar region: Secondary | ICD-10-CM | POA: Diagnosis not present

## 2018-05-10 DIAGNOSIS — G4709 Other insomnia: Secondary | ICD-10-CM | POA: Diagnosis not present

## 2018-05-10 DIAGNOSIS — R627 Adult failure to thrive: Secondary | ICD-10-CM | POA: Diagnosis not present

## 2018-05-12 ENCOUNTER — Other Ambulatory Visit: Payer: Self-pay

## 2018-05-12 ENCOUNTER — Telehealth: Payer: Self-pay | Admitting: Vascular Surgery

## 2018-05-12 ENCOUNTER — Other Ambulatory Visit: Payer: Self-pay | Admitting: *Deleted

## 2018-05-12 DIAGNOSIS — M1991 Primary osteoarthritis, unspecified site: Secondary | ICD-10-CM | POA: Diagnosis not present

## 2018-05-12 DIAGNOSIS — I69364 Other paralytic syndrome following cerebral infarction affecting left non-dominant side: Secondary | ICD-10-CM | POA: Diagnosis not present

## 2018-05-12 DIAGNOSIS — K5900 Constipation, unspecified: Secondary | ICD-10-CM | POA: Diagnosis not present

## 2018-05-12 DIAGNOSIS — I10 Essential (primary) hypertension: Secondary | ICD-10-CM | POA: Diagnosis not present

## 2018-05-12 DIAGNOSIS — R600 Localized edema: Secondary | ICD-10-CM | POA: Diagnosis not present

## 2018-05-12 DIAGNOSIS — Z8673 Personal history of transient ischemic attack (TIA), and cerebral infarction without residual deficits: Secondary | ICD-10-CM | POA: Diagnosis not present

## 2018-05-12 DIAGNOSIS — Z6823 Body mass index (BMI) 23.0-23.9, adult: Secondary | ICD-10-CM | POA: Diagnosis not present

## 2018-05-12 DIAGNOSIS — G894 Chronic pain syndrome: Secondary | ICD-10-CM | POA: Diagnosis not present

## 2018-05-12 DIAGNOSIS — R627 Adult failure to thrive: Secondary | ICD-10-CM | POA: Diagnosis not present

## 2018-05-12 DIAGNOSIS — E1151 Type 2 diabetes mellitus with diabetic peripheral angiopathy without gangrene: Secondary | ICD-10-CM | POA: Diagnosis not present

## 2018-05-12 DIAGNOSIS — Z7982 Long term (current) use of aspirin: Secondary | ICD-10-CM | POA: Diagnosis not present

## 2018-05-12 DIAGNOSIS — Z87891 Personal history of nicotine dependence: Secondary | ICD-10-CM | POA: Diagnosis not present

## 2018-05-12 DIAGNOSIS — Z7902 Long term (current) use of antithrombotics/antiplatelets: Secondary | ICD-10-CM | POA: Diagnosis not present

## 2018-05-12 DIAGNOSIS — M5136 Other intervertebral disc degeneration, lumbar region: Secondary | ICD-10-CM | POA: Diagnosis not present

## 2018-05-12 DIAGNOSIS — Z7409 Other reduced mobility: Secondary | ICD-10-CM | POA: Diagnosis not present

## 2018-05-12 DIAGNOSIS — Z794 Long term (current) use of insulin: Secondary | ICD-10-CM | POA: Diagnosis not present

## 2018-05-12 DIAGNOSIS — G4709 Other insomnia: Secondary | ICD-10-CM | POA: Diagnosis not present

## 2018-05-12 DIAGNOSIS — I6523 Occlusion and stenosis of bilateral carotid arteries: Secondary | ICD-10-CM

## 2018-05-12 NOTE — Patient Outreach (Signed)
  [facility]) Care Management  05/12/2018  [name] 12-22-1940 [id number]   EMMI- general discharge after 05/07/18 - 05/08/18 admission to [facility] for TIA   RED ON EMMI ALERT Day # 1 Date: 05/10/18 1210 Red Alert Reason: scheduled follow up? No    Outreach attempt # 1 Outreach attempt to patient x 2. No answer x 2, line busy x 1  and unable to leave voicemail message. Returned the call    Transition of care services noted to be completed by primary care MD office staff   Plan: [facility] RN CM sent an unsuccessful outreach letter and scheduled this patient for another call attempt within 4 business days  Camdin Hegner L. Lavina Hamman, RN, BSN, [place] Coordinator Office number [phone] Mobile number [phone]  Main [facility] number [phone] Fax number [phone]

## 2018-05-12 NOTE — Telephone Encounter (Signed)
sch appt spk to pt 05/13/18 3pm Carotid 4pm f/u MD

## 2018-05-13 ENCOUNTER — Other Ambulatory Visit: Payer: Self-pay

## 2018-05-13 ENCOUNTER — Ambulatory Visit (INDEPENDENT_AMBULATORY_CARE_PROVIDER_SITE_OTHER): Payer: Medicare Other | Admitting: Vascular Surgery

## 2018-05-13 ENCOUNTER — Other Ambulatory Visit: Payer: Self-pay | Admitting: *Deleted

## 2018-05-13 ENCOUNTER — Encounter: Payer: Self-pay | Admitting: Vascular Surgery

## 2018-05-13 ENCOUNTER — Ambulatory Visit (HOSPITAL_COMMUNITY)
Admission: RE | Admit: 2018-05-13 | Discharge: 2018-05-13 | Disposition: A | Discharge status: Home / Self Care | Payer: Medicare Other | Source: Ambulatory Visit | Attending: Vascular Surgery | Admitting: Vascular Surgery

## 2018-05-13 VITALS — BP 88/48 | HR 57 | Temp 97.8°F | Resp 20 | Ht 63.0 in | Wt 130.0 lb

## 2018-05-13 DIAGNOSIS — I2510 Atherosclerotic heart disease of native coronary artery without angina pectoris: Secondary | ICD-10-CM | POA: Insufficient documentation

## 2018-05-13 DIAGNOSIS — Z7982 Long term (current) use of aspirin: Secondary | ICD-10-CM | POA: Diagnosis not present

## 2018-05-13 DIAGNOSIS — I10 Essential (primary) hypertension: Secondary | ICD-10-CM | POA: Diagnosis not present

## 2018-05-13 DIAGNOSIS — M5136 Other intervertebral disc degeneration, lumbar region: Secondary | ICD-10-CM | POA: Diagnosis not present

## 2018-05-13 DIAGNOSIS — E1151 Type 2 diabetes mellitus with diabetic peripheral angiopathy without gangrene: Secondary | ICD-10-CM | POA: Diagnosis not present

## 2018-05-13 DIAGNOSIS — F17200 Nicotine dependence, unspecified, uncomplicated: Secondary | ICD-10-CM | POA: Diagnosis not present

## 2018-05-13 DIAGNOSIS — Z8673 Personal history of transient ischemic attack (TIA), and cerebral infarction without residual deficits: Secondary | ICD-10-CM | POA: Insufficient documentation

## 2018-05-13 DIAGNOSIS — Z794 Long term (current) use of insulin: Secondary | ICD-10-CM | POA: Diagnosis not present

## 2018-05-13 DIAGNOSIS — G4709 Other insomnia: Secondary | ICD-10-CM | POA: Diagnosis not present

## 2018-05-13 DIAGNOSIS — Z6823 Body mass index (BMI) 23.0-23.9, adult: Secondary | ICD-10-CM | POA: Diagnosis not present

## 2018-05-13 DIAGNOSIS — R600 Localized edema: Secondary | ICD-10-CM | POA: Diagnosis not present

## 2018-05-13 DIAGNOSIS — K5900 Constipation, unspecified: Secondary | ICD-10-CM | POA: Diagnosis not present

## 2018-05-13 DIAGNOSIS — Z87891 Personal history of nicotine dependence: Secondary | ICD-10-CM | POA: Diagnosis not present

## 2018-05-13 DIAGNOSIS — Z7902 Long term (current) use of antithrombotics/antiplatelets: Secondary | ICD-10-CM | POA: Diagnosis not present

## 2018-05-13 DIAGNOSIS — E119 Type 2 diabetes mellitus without complications: Secondary | ICD-10-CM | POA: Diagnosis not present

## 2018-05-13 DIAGNOSIS — R627 Adult failure to thrive: Secondary | ICD-10-CM | POA: Diagnosis not present

## 2018-05-13 DIAGNOSIS — G894 Chronic pain syndrome: Secondary | ICD-10-CM | POA: Diagnosis not present

## 2018-05-13 DIAGNOSIS — I6523 Occlusion and stenosis of bilateral carotid arteries: Secondary | ICD-10-CM | POA: Insufficient documentation

## 2018-05-13 DIAGNOSIS — M1991 Primary osteoarthritis, unspecified site: Secondary | ICD-10-CM | POA: Diagnosis not present

## 2018-05-13 NOTE — Progress Notes (Signed)
HISTORY AND PHYSICAL     CC:  Narrowing of carotid and stroke Requesting Provider:  Redmond School, MD  HPI: This is a 77 y.o. female who was discharged from [facility] on 05/08/18.  She had been admitted for left sided weakness and slurring of her speech.  On arrival to the hospital, she was hypotensive and had an acute kidney injury.  She was started on Plavix and aspirin.   She is also on a statin for cholesterol management.   She did have an echo, which revealed EF of 65-70% with moderate AS, milld MS/MR, mild TR.  There was no evidence of cardiac thrombus.  She had a CTA, which revealed severe left proximal ICA stenosis with calcified plaque.  They felt her AKI was related to dehydration and resolved with IVF.  Her ACEI and HCTZ was discontinued as well as the NSAID.  She does have a seizure disorder that per neurology, her left leg weakness may be a Todd's paralysis.  Her Keppra was increased to bid.  She has hx of paroxysmal SVT and is continued on a CCB and BB.    Pt and family states that she had an episode about a year ago where she looked like she looked like she was staring into space and fell breaking her femoral head requiring surgery.  A subsequent event, she appeared to be having a seizure and fell again causing a spiral fx in her femur.  She wears a brace on her leg.  A couple of weeks ago, her family states that she had slurred speech and facial droop that lasted about an hour and resolved.  Her son states that the facial droop has improved.    She is a current smoker and has smoked for 50 years.   Past Medical History:  Diagnosis Date  . Anemia   . Aortic insufficiency    Moderate  . Back pain   . Carotid stenosis, bilateral   . Cirrhosis ([place])   . Closed left femoral fracture ([place]) 04/21/2017  . Coronary artery disease    Stent x 2 RCA 1995, cardiac catheterization 09/2016 showing only mild atherosclerosis  . DDD (degenerative disc disease), lumbar   . Essential  hypertension   . Falls   . GERD (gastroesophageal reflux disease)   . Hiatal hernia   . History of stroke   . Hypercholesteremia   . IBS (irritable bowel syndrome)   . Non-alcoholic fatty liver disease   . OSA (obstructive sleep apnea)    no CPAP  . Pericardial effusion    a. small by echo 2018.  Marland Kitchen PSVT (paroxysmal supraventricular tachycardia) ([place])   . Recurrent UTI   . Seizures ([place])   . Stroke ([place])   . Type 2 diabetes mellitus ([place])    off medication  . Varices, esophageal ([place])     Past Surgical History:  Procedure Laterality Date  . [employer]  . APPENDECTOMY    . BACK SURGERY  1985  . CARDIAC CATHETERIZATION  [phone]   Stent to the proximal RCA after MI   . CARDIAC CATHETERIZATION N/A 09/26/2016   Procedure: Left Heart Cath and Coronary Angiography;  Surgeon: Leonie Man, MD;  Location: [place] [facility];  Service: Cardiovascular;  Laterality: N/A;  . CATARACT EXTRACTION W/PHACO Right 07/04/2014   Procedure: CATARACT EXTRACTION PHACO AND INTRAOCULAR LENS PLACEMENT (IOC);  Surgeon: Elta Guadeloupe T. Gershon Crane, MD;  Location: [facility];  Service: Ophthalmology;  Laterality: Right;  CDE:13.13  . COLONOSCOPY    . DILATION AND CURETTAGE OF UTERUS     x2  . Epi Retinal Membrane Peel Left   . ERCP    . ESOPHAGEAL BANDING N/A 04/01/2013   Procedure: ESOPHAGEAL BANDING;  Surgeon: Rogene Houston, MD;  Location: [facility];  Service: Endoscopy;  Laterality: N/A;  . ESOPHAGEAL BANDING N/A 05/24/2013   Procedure: ESOPHAGEAL BANDING;  Surgeon: Rogene Houston, MD;  [facility];  Service: Endoscopy;  Laterality: N/A;  . ESOPHAGEAL BANDING N/A 06/21/2014   Procedure: ESOPHAGEAL BANDING;  Surgeon: Rogene Houston, MD;  [facility];  Service: Endoscopy;  Laterality: N/A;  . ESOPHAGOGASTRODUODENOSCOPY N/A 04/01/2013   Procedure: ESOPHAGOGASTRODUODENOSCOPY (EGD);  Surgeon: Rogene Houston, MD;  [facility];  Service: Endoscopy;   Laterality: N/A;  230-rescheduled to 8:30am Ann notified pt  . ESOPHAGOGASTRODUODENOSCOPY N/A 05/24/2013   Procedure: ESOPHAGOGASTRODUODENOSCOPY (EGD);  Surgeon: Rogene Houston, MD;  [facility];  Service: Endoscopy;  Laterality: N/A;  730  . ESOPHAGOGASTRODUODENOSCOPY N/A 06/21/2014   Procedure: ESOPHAGOGASTRODUODENOSCOPY (EGD);  Surgeon: Rogene Houston, MD;  [facility];  Service: Endoscopy;  Laterality: N/A;  930-rescheduled 10/14 @ 1200 Ann to notify pt  . EYE SURGERY  08   cataract surgery of the left eye  . FEMUR IM NAIL Left 03/02/2017   Procedure: INTRAMEDULLARY (IM) NAIL FEMORAL;  Surgeon: Rod Can, MD;  Location: [place];  Service: Orthopedics;  Laterality: Left;  . HARDWARE REMOVAL Right 01/17/2013   Procedure: REMOVAL OF HARDWARE AND EXCISION ULNAR STYLOID RIGHT WRIST;  Surgeon: Tennis Must, MD;  Location: [place];  Service: Orthopedics;  Laterality: Right;  . MYRINGOTOMY  2012   both ears  . ORIF FEMUR FRACTURE Left 04/23/2017   Procedure: OPEN REDUCTION INTERNAL FIXATION (ORIF) DISTAL FEMUR FRACTURE (FRACTURE AROUND FEMORAL NAIL);  Surgeon: Altamese [place] Lake, MD;  Location: [place];  Service: Orthopedics;  Laterality: Left;  . TONSILLECTOMY    . VAGINAL HYSTERECTOMY  1972  . WRIST SURGERY     rt wrist hardwear removal    Allergies  Allergen Reactions  . Tape Rash and Other (See Comments)    Please use paper tape    Current Outpatient Medications  Medication Sig Dispense Refill  . aspirin EC 81 MG tablet Take 81 mg by mouth daily.    Marland Kitchen atorvastatin (LIPITOR) 80 MG tablet Take 1 tablet (80 mg total) by mouth daily. 30 tablet 1  . carvedilol (COREG) 12.5 MG tablet Take 1 tablet (12.5 mg total) by mouth 2 (two) times daily with a meal. (Patient taking differently: Take 12.5 mg by mouth daily. ) 60 tablet 1  . clopidogrel (PLAVIX) 75 MG tablet Take 1 tablet (75 mg total) by mouth daily with breakfast. 30 tablet 0  . diltiazem  (CARDIZEM CD) 120 MG 24 hr capsule Take 120 mg by mouth daily.    . DiphenhydrAMINE HCl, Sleep, (SLEEP AID) 25 MG CAPS Take 1-2 capsules by mouth at bedtime as needed (for sleep).    . furosemide (LASIX) 20 MG tablet Take 20 mg by mouth daily as needed.     . isosorbide mononitrate (IMDUR) 30 MG 24 hr tablet Take 30 mg by mouth daily.     Marland Kitchen levETIRAcetam (KEPPRA) 500 MG tablet Take 1 tablet (500 mg total) by mouth 2 (two) times daily. 60 tablet 0  . LORazepam (ATIVAN) 1 MG tablet Take 1 tablet by mouth daily  as needed.    Marland Kitchen omeprazole (PRILOSEC) 20 MG capsule Take 1 capsule (20 mg total) by mouth daily. 90 capsule 3  . ondansetron (ZOFRAN) 4 MG tablet Take 1 tablet (4 mg total) by mouth every 8 (eight) hours as needed for nausea or vomiting. 30 tablet 1  . ranitidine (ZANTAC) 150 MG tablet Take 1 tablet by mouth 2 (two) times daily as needed.     No current facility-administered medications for this visit.     Family History  Problem Relation Age of Onset  . Diabetes Mother   . Heart failure Father   . Heart failure Maternal Aunt     Social History   Socioeconomic History  . Marital status: Widowed    Spouse name: Not on file  . Number of children: 2  . Years of education: Not on file  . Highest education level: Not on file  Occupational History    Employer: RETIRED  Social Needs  . Financial resource strain: Not on file  . Food insecurity:    Worry: Not on file    Inability: Not on file  . Transportation needs:    Medical: Not on file    Non-medical: Not on file  Tobacco Use  . Smoking status: Current Some Day Smoker    Packs/day: 0.25    Years: 50.00    Pack years: 12.50    Types: Cigarettes  . Smokeless tobacco: Never Used  . Tobacco comment: 1 pk lasts a week.   Substance and Sexual Activity  . Alcohol use: No    Alcohol/week: 0.0 standard drinks  . Drug use: No  . Sexual activity: Yes    Birth control/protection: Surgical  Lifestyle  . Physical activity:     Days per week: Not on file    Minutes per session: Not on file  . Stress: Not on file  Relationships  . Social connections:    Talks on phone: Not on file    Gets together: Not on file    Attends religious service: Not on file    Active member of club or organization: Not on file    Attends meetings of clubs or organizations: Not on file    Relationship status: Not on file  . Intimate partner violence:    Fear of current or ex partner: Not on file    Emotionally abused: Not on file    Physically abused: Not on file    Forced sexual activity: Not on file  Other Topics Concern  . Not on file  Social History Narrative  . Not on file     REVIEW OF SYSTEMS:   [X]  denotes positive finding, [ ]  denotes negative finding Cardiac  Comments:  Chest pain or chest pressure:    Shortness of breath upon exertion:    Short of breath when lying flat:    Irregular heart rhythm: x Paroxysmal SVT  Hx MI x   Vascular    Pain in calf, thigh, or hip brought on by ambulation:    Pain in feet at night that wakes you up from your sleep:     Blood clot in your veins:    Leg swelling:         Pulmonary    Oxygen at home:    Productive cough:     Wheezing:         Neurologic    Sudden weakness in arms or legs:     Sudden numbness in arms or legs:  Sudden onset of difficulty speaking or slurred speech: x See HPI  Temporary loss of vision in one eye:     Problems with dizziness:         Gastrointestinal    Blood in stool:     Vomited blood:         Genitourinary    Burning when urinating:     Blood in urine:        Psychiatric    Major depression:         Hematologic    Bleeding problems:    Problems with blood clotting too easily:        Skin    Rashes or ulcers:        Constitutional    Fever or chills:      PHYSICAL EXAMINATION:  Vitals:   05/13/18 1536  BP: (!) 88/48  Pulse: (!) 57  Resp: 20  Temp: 97.8 F (36.6 C)  SpO2: 98%   Vitals:   05/13/18 1536    Weight: 130 lb (59 kg)  Height: 5' 3"  (1.6 m)   Body mass index is 23.03 kg/m.  General:  WDWN in NAD; vital signs documented above Gait: Not observed HENT: WNL, normocephalic Pulmonary: normal non-labored breathing , without Rales, rhonchi,  wheezing Cardiac: regular HR, with  Murmur throughout with bilateral carotid bruits possibly radiating from murmur Skin: without rashes; ecchymosis left arm. Vascular Exam/Pulses:  Right Left  Radial 2+ (normal) 2+ (normal)  Brachial 2+ (normal) 2+ (normal)   Extremities: without ischemic changes, without Gangrene , without cellulitis; without open wounds;  Musculoskeletal: no muscle wasting or atrophy  Neurologic: A&O X 3;  No focal weakness or paresthesias are detected; mild facial droop; BUE strength equal bilaterally Psychiatric:  The pt has Normal affect.   Non-Invasive Vascular Imaging:   Carotid duplex 05/13/18: 1-39% bilateral ICA stenosis; bilateral vertebral arteries have antegrade flow; normal flow hemodynamics were seen in the bilateral subclavian veins.   Pt meds includes: Statin:  Yes.   Beta Blocker:  Yes.   Aspirin:  Yes.   ACEI:  No. ARB:  No. CCB use:  Yes Other Antiplatelet/Anticoagulant:  Yes Plavix   ASSESSMENT/PLAN:: 77 y.o. female with recent CVA with hx of carotid artery stenosis   -pt with recent stroke with facial droop and slurred speech.  CTA at [facility] revealed high grade stenosis of the left ICA with calcified plaque.  She was referred urgently to [facility].  She did have a duplex today that revealed 1-39% bilateral ICA stenosis.  Dr. Oneida Alar felt that pt's stroke was not related to her carotid disease.  He discussed with the pt and her family that he will see her back in 3 months with repeat duplex.  If she has another event before then, will need formal cerebral angiography to evaluate carotid stenosis.  Pt and family are in agreement.  She will continue her plavix and asa. -she does have murmur present that  is known - she last saw Dr. Domenic Polite December 2018 for evaluation for AS and syncope and CAD.     Leontine Locket, PA-C [facility] [phone]  Clinic MD:  Pt seen and examined with Dr. Oneida Alar  History and exam details as above.  Discrepant CTA and duplex findings.  There is calcification of the lesion on the CT scan so this probably is overestimated.  Duplex ultrasound showed minimal stenosis today.  If the patient has a new event we would probably proceed with  carotid angiogram.  Otherwise she will follow-up in 3 months time with a repeat carotid duplex exam.  If everything is stable at that point we will probably space out her follow-up.  She will continue her Plavix and aspirin.  Ruta Hinds, MD [facility] [place] Office: [phone] Pager: [phone]

## 2018-05-13 NOTE — Patient Outreach (Signed)
[place] [facility]) Care Management  05/13/2018  [name] 22-Nov-1940 [id number]   EMMI- general discharge after 05/07/18 - 05/08/18 admission to [facility] for TIA   RED ON EMMI ALERT Day # 1 Date: 05/10/18 1210 Red Alert Reason: scheduled follow up? No    Outreach attempt # 2 successful  Patient is able to verify HIPAA [place] Management RN reviewed and addressed red alert with patient  [name] confirms the answers to the EMMI question was correct on the date asked but since that date she has an appointment scheduled for today 05/13/18 1500 for [facility] ASAP appointment carotid stenosis recheck and an appointment to see Dr Gerarda Fraction, her primary MD on tomorrow, 05/14/18   Social She continues to live at home with good support from her family to include her son, granddaughters and other family. She feels good and is needing independent/assist with ADLs and assist with transportation and iADLs from her family  Advanced directives she has her son as POA and has voiced interest in getting information about mental health advance directives from [facility]  She states she would like to review the mental health advance directives   Consent: [facility] RN CM reviewed [facility] services with patient. Patient gave verbal consent for services for [place] patient that there will be further automated EMMI- post discharge calls to assess how the patient is doing following the recent hospitalization Advised the patient that another call may be received from a nurse if any of their responses were abnormal. Patient voiced understanding and was appreciative of f/u call.Transition of care services noted to be completed by primary care MD office staff   Plan: [facility] RN CM will referra [name] to [facility] for assistance with mental health directives    Kimberly L. Lavina Hamman, RN, BSN, [place] Coordinator Office number [phone] Mobile number [phone]  Main [facility] number  [phone] Fax number [phone]

## 2018-05-14 DIAGNOSIS — K219 Gastro-esophageal reflux disease without esophagitis: Secondary | ICD-10-CM | POA: Diagnosis not present

## 2018-05-14 DIAGNOSIS — I10 Essential (primary) hypertension: Secondary | ICD-10-CM | POA: Diagnosis not present

## 2018-05-14 DIAGNOSIS — Z6822 Body mass index (BMI) 22.0-22.9, adult: Secondary | ICD-10-CM | POA: Diagnosis not present

## 2018-05-14 DIAGNOSIS — R110 Nausea: Secondary | ICD-10-CM | POA: Diagnosis not present

## 2018-05-14 DIAGNOSIS — G8194 Hemiplegia, unspecified affecting left nondominant side: Secondary | ICD-10-CM | POA: Diagnosis not present

## 2018-05-14 DIAGNOSIS — I639 Cerebral infarction, unspecified: Secondary | ICD-10-CM | POA: Diagnosis not present

## 2018-05-14 DIAGNOSIS — I959 Hypotension, unspecified: Secondary | ICD-10-CM | POA: Diagnosis not present

## 2018-05-17 ENCOUNTER — Other Ambulatory Visit: Payer: Self-pay

## 2018-05-17 DIAGNOSIS — Z8673 Personal history of transient ischemic attack (TIA), and cerebral infarction without residual deficits: Secondary | ICD-10-CM | POA: Diagnosis not present

## 2018-05-17 DIAGNOSIS — Z6823 Body mass index (BMI) 23.0-23.9, adult: Secondary | ICD-10-CM | POA: Diagnosis not present

## 2018-05-17 DIAGNOSIS — M1991 Primary osteoarthritis, unspecified site: Secondary | ICD-10-CM | POA: Diagnosis not present

## 2018-05-17 DIAGNOSIS — I10 Essential (primary) hypertension: Secondary | ICD-10-CM | POA: Diagnosis not present

## 2018-05-17 DIAGNOSIS — R600 Localized edema: Secondary | ICD-10-CM | POA: Diagnosis not present

## 2018-05-17 DIAGNOSIS — G4709 Other insomnia: Secondary | ICD-10-CM | POA: Diagnosis not present

## 2018-05-17 DIAGNOSIS — R627 Adult failure to thrive: Secondary | ICD-10-CM | POA: Diagnosis not present

## 2018-05-17 DIAGNOSIS — G894 Chronic pain syndrome: Secondary | ICD-10-CM | POA: Diagnosis not present

## 2018-05-17 DIAGNOSIS — M5136 Other intervertebral disc degeneration, lumbar region: Secondary | ICD-10-CM | POA: Diagnosis not present

## 2018-05-17 DIAGNOSIS — E1151 Type 2 diabetes mellitus with diabetic peripheral angiopathy without gangrene: Secondary | ICD-10-CM | POA: Diagnosis not present

## 2018-05-17 DIAGNOSIS — Z7982 Long term (current) use of aspirin: Secondary | ICD-10-CM | POA: Diagnosis not present

## 2018-05-17 DIAGNOSIS — Z87891 Personal history of nicotine dependence: Secondary | ICD-10-CM | POA: Diagnosis not present

## 2018-05-17 DIAGNOSIS — Z794 Long term (current) use of insulin: Secondary | ICD-10-CM | POA: Diagnosis not present

## 2018-05-17 DIAGNOSIS — Z7902 Long term (current) use of antithrombotics/antiplatelets: Secondary | ICD-10-CM | POA: Diagnosis not present

## 2018-05-17 DIAGNOSIS — I959 Hypotension, unspecified: Secondary | ICD-10-CM | POA: Diagnosis not present

## 2018-05-17 DIAGNOSIS — Z6822 Body mass index (BMI) 22.0-22.9, adult: Secondary | ICD-10-CM | POA: Diagnosis not present

## 2018-05-17 DIAGNOSIS — R110 Nausea: Secondary | ICD-10-CM | POA: Diagnosis not present

## 2018-05-17 DIAGNOSIS — K5900 Constipation, unspecified: Secondary | ICD-10-CM | POA: Diagnosis not present

## 2018-05-17 NOTE — Patient Outreach (Signed)
[place] [name]  05/17/2018  [name] Nov 10, 1940 [id number]   Initial outreach to [name] regarding social work referral for assistance with mental health Advance Directive.  [name] was unable to talk at the time.  BSW will call her again tomorrow.  Ronn Melena, BSW Social Worker [phone]

## 2018-05-19 ENCOUNTER — Other Ambulatory Visit: Payer: Self-pay

## 2018-05-19 ENCOUNTER — Emergency Department (HOSPITAL_COMMUNITY): Payer: Medicare Other

## 2018-05-19 ENCOUNTER — Encounter (HOSPITAL_COMMUNITY): Payer: Self-pay | Admitting: Emergency Medicine

## 2018-05-19 ENCOUNTER — Emergency Department (HOSPITAL_COMMUNITY)
Admission: EM | Admit: 2018-05-19 | Discharge: 2018-05-19 | Disposition: A | Discharge status: Home / Self Care | Payer: Medicare Other | Attending: Family Medicine | Admitting: Family Medicine

## 2018-05-19 DIAGNOSIS — R627 Adult failure to thrive: Secondary | ICD-10-CM | POA: Diagnosis not present

## 2018-05-19 DIAGNOSIS — E1151 Type 2 diabetes mellitus with diabetic peripheral angiopathy without gangrene: Secondary | ICD-10-CM | POA: Diagnosis not present

## 2018-05-19 DIAGNOSIS — E119 Type 2 diabetes mellitus without complications: Secondary | ICD-10-CM | POA: Insufficient documentation

## 2018-05-19 DIAGNOSIS — G459 Transient cerebral ischemic attack, unspecified: Secondary | ICD-10-CM | POA: Insufficient documentation

## 2018-05-19 DIAGNOSIS — R471 Dysarthria and anarthria: Secondary | ICD-10-CM | POA: Diagnosis not present

## 2018-05-19 DIAGNOSIS — K5900 Constipation, unspecified: Secondary | ICD-10-CM | POA: Diagnosis not present

## 2018-05-19 DIAGNOSIS — M1991 Primary osteoarthritis, unspecified site: Secondary | ICD-10-CM | POA: Diagnosis not present

## 2018-05-19 DIAGNOSIS — Z7982 Long term (current) use of aspirin: Secondary | ICD-10-CM | POA: Insufficient documentation

## 2018-05-19 DIAGNOSIS — I252 Old myocardial infarction: Secondary | ICD-10-CM | POA: Diagnosis not present

## 2018-05-19 DIAGNOSIS — Z7902 Long term (current) use of antithrombotics/antiplatelets: Secondary | ICD-10-CM | POA: Diagnosis not present

## 2018-05-19 DIAGNOSIS — Z79899 Other long term (current) drug therapy: Secondary | ICD-10-CM | POA: Insufficient documentation

## 2018-05-19 DIAGNOSIS — G894 Chronic pain syndrome: Secondary | ICD-10-CM | POA: Diagnosis not present

## 2018-05-19 DIAGNOSIS — Z87891 Personal history of nicotine dependence: Secondary | ICD-10-CM | POA: Diagnosis not present

## 2018-05-19 DIAGNOSIS — I6389 Other cerebral infarction: Secondary | ICD-10-CM | POA: Diagnosis not present

## 2018-05-19 DIAGNOSIS — I2510 Atherosclerotic heart disease of native coronary artery without angina pectoris: Secondary | ICD-10-CM | POA: Diagnosis not present

## 2018-05-19 DIAGNOSIS — R600 Localized edema: Secondary | ICD-10-CM | POA: Diagnosis not present

## 2018-05-19 DIAGNOSIS — G4709 Other insomnia: Secondary | ICD-10-CM | POA: Diagnosis not present

## 2018-05-19 DIAGNOSIS — Z8673 Personal history of transient ischemic attack (TIA), and cerebral infarction without residual deficits: Secondary | ICD-10-CM | POA: Insufficient documentation

## 2018-05-19 DIAGNOSIS — F17210 Nicotine dependence, cigarettes, uncomplicated: Secondary | ICD-10-CM | POA: Diagnosis not present

## 2018-05-19 DIAGNOSIS — Z794 Long term (current) use of insulin: Secondary | ICD-10-CM | POA: Diagnosis not present

## 2018-05-19 DIAGNOSIS — I10 Essential (primary) hypertension: Secondary | ICD-10-CM | POA: Diagnosis not present

## 2018-05-19 DIAGNOSIS — R29810 Facial weakness: Secondary | ICD-10-CM | POA: Diagnosis present

## 2018-05-19 DIAGNOSIS — R4781 Slurred speech: Secondary | ICD-10-CM | POA: Diagnosis not present

## 2018-05-19 DIAGNOSIS — M5136 Other intervertebral disc degeneration, lumbar region: Secondary | ICD-10-CM | POA: Diagnosis not present

## 2018-05-19 DIAGNOSIS — Z6823 Body mass index (BMI) 23.0-23.9, adult: Secondary | ICD-10-CM | POA: Diagnosis not present

## 2018-05-19 LAB — RAPID URINE DRUG SCREEN, HOSP PERFORMED
AMPHETAMINES: NOT DETECTED
BARBITURATES: NOT DETECTED
BENZODIAZEPINES: POSITIVE — AB
Cocaine: NOT DETECTED
Opiates: NOT DETECTED
Tetrahydrocannabinol: NOT DETECTED

## 2018-05-19 LAB — DIFFERENTIAL
Basophils Absolute: 0 10*3/uL (ref 0.0–0.1)
Basophils Relative: 0 %
Eosinophils Absolute: 0.1 10*3/uL (ref 0.0–0.7)
Eosinophils Relative: 3 %
LYMPHS PCT: 26 %
Lymphs Abs: 1.3 10*3/uL (ref 0.7–4.0)
Monocytes Absolute: 0.4 10*3/uL (ref 0.1–1.0)
Monocytes Relative: 8 %
NEUTROS PCT: 63 %
Neutro Abs: 3.1 10*3/uL (ref 1.7–7.7)

## 2018-05-19 LAB — I-STAT CHEM 8, ED
BUN: 20 mg/dL (ref 8–23)
CREATININE: 1.3 mg/dL — AB (ref 0.44–1.00)
Calcium, Ion: 1.09 mmol/L — ABNORMAL LOW (ref 1.15–1.40)
Chloride: 104 mmol/L (ref 98–111)
GLUCOSE: 267 mg/dL — AB (ref 70–99)
HCT: 28 % — ABNORMAL LOW (ref 36.0–46.0)
Hemoglobin: 9.5 g/dL — ABNORMAL LOW (ref 12.0–15.0)
POTASSIUM: 4.6 mmol/L (ref 3.5–5.1)
Sodium: 139 mmol/L (ref 135–145)
TCO2: 25 mmol/L (ref 22–32)

## 2018-05-19 LAB — URINALYSIS, ROUTINE W REFLEX MICROSCOPIC
BILIRUBIN URINE: NEGATIVE
Glucose, UA: NEGATIVE mg/dL
HGB URINE DIPSTICK: NEGATIVE
Ketones, ur: NEGATIVE mg/dL
Leukocytes, UA: NEGATIVE
Nitrite: NEGATIVE
Protein, ur: NEGATIVE mg/dL
SPECIFIC GRAVITY, URINE: 1.023 (ref 1.005–1.030)
pH: 5 (ref 5.0–8.0)

## 2018-05-19 LAB — COMPREHENSIVE METABOLIC PANEL
ALT: 21 U/L (ref 0–44)
AST: 27 U/L (ref 15–41)
Albumin: 3.1 g/dL — ABNORMAL LOW (ref 3.5–5.0)
Alkaline Phosphatase: 86 U/L (ref 38–126)
Anion gap: 8 (ref 5–15)
BUN: 16 mg/dL (ref 8–23)
CHLORIDE: 107 mmol/L (ref 98–111)
CO2: 25 mmol/L (ref 22–32)
CREATININE: 1.3 mg/dL — AB (ref 0.44–1.00)
Calcium: 8.7 mg/dL — ABNORMAL LOW (ref 8.9–10.3)
GFR calc Af Amer: 45 mL/min — ABNORMAL LOW (ref >60)
GFR calc non Af Amer: 39 mL/min — ABNORMAL LOW (ref >60)
Glucose, Bld: 235 mg/dL — ABNORMAL HIGH (ref 70–99)
Potassium: 4 mmol/L (ref 3.5–5.1)
SODIUM: 140 mmol/L (ref 135–145)
Total Bilirubin: 1.2 mg/dL (ref 0.3–1.2)
Total Protein: 5.3 g/dL — ABNORMAL LOW (ref 6.5–8.1)

## 2018-05-19 LAB — CBC
HCT: 30.7 % — ABNORMAL LOW (ref 36.0–46.0)
Hemoglobin: 10 g/dL — ABNORMAL LOW (ref 12.0–15.0)
MCH: 31.6 pg (ref 26.0–34.0)
MCHC: 32.6 g/dL (ref 30.0–36.0)
MCV: 97.2 fL (ref 78.0–100.0)
PLATELETS: 95 10*3/uL — AB (ref 150–400)
RBC: 3.16 MIL/uL — AB (ref 3.87–5.11)
RDW: 13.8 % (ref 11.5–15.5)
WBC: 4.9 10*3/uL (ref 4.0–10.5)

## 2018-05-19 LAB — I-STAT TROPONIN, ED: Troponin i, poc: 0.02 ng/mL (ref 0.00–0.08)

## 2018-05-19 LAB — PROTIME-INR
INR: 1.24
Prothrombin Time: 15.5 seconds — ABNORMAL HIGH (ref 11.4–15.2)

## 2018-05-19 LAB — CBG MONITORING, ED: Glucose-Capillary: 262 mg/dL — ABNORMAL HIGH (ref 70–99)

## 2018-05-19 LAB — ETHANOL

## 2018-05-19 LAB — APTT: aPTT: 29 seconds (ref 24–36)

## 2018-05-19 MED ORDER — SODIUM CHLORIDE 0.9 % IV SOLN
INTRAVENOUS | Status: DC
  Administered 2018-05-19: 14:00:00 via INTRAVENOUS

## 2018-05-19 NOTE — ED Notes (Signed)
Phlebotomy at bedside.

## 2018-05-19 NOTE — Patient Outreach (Signed)
[place] [facility]) Care Management  05/19/2018  [name] [birth year] [id number]   Second outreach to [name] regarding social work referral for assistance with [place].  BSW explained the difference between [place].  [name] requested assistance with Medical Advance Directives.  BSW explained Medical POA and Living Will.  BSW mailed Advance Directives packet EMMI and packet.  BSW will follow up with [name] next week to ensure receipt and to further review documentation with her.   Ronn Melena, BSW Social Worker [phone]

## 2018-05-19 NOTE — ED Notes (Signed)
Hospitalist at bedside 

## 2018-05-19 NOTE — ED Notes (Signed)
Pt returned from MRI °

## 2018-05-19 NOTE — Consult Note (Signed)
[facility] TeleNeurology Consult Services  Impression: 77 yo female with PMH of aortic insufficiency, b/l carotid stenosis as ntoed below, cirrhosis, HTN, HLD, OSA, documented seizures, DM II, esophageal varices, chronic right MCA stroke, recent left perirolandic stroke (?with left sided weakness) who p/w transient Left facial droop and dysarthria in the setting of hypotension SBP 60-70s. Near complete resolution of symptoms. Minimal residual dysarthria and Right minimal NLF on exam.    Not a tpa candidate due to: Not a TPA candidate due to recent stroke within 2 weeks, rapidly improving symptoms, minimal nondisabling deficits on exam At this time risks of TPA outweigh benefits.  Does not meet LVO screening criteria (no aphasia, neglect, gaze deviation, dense hemiparesis, or visual field deficits on exam), therefore advanced imaging is not indicated.   Comments: Door Time:12:52 [facility] contacted: 13:07 [facility] at bedside: 13:12 NIHSS assessment start time (time the consultation begins): 13:23 Last known well time (LKW): 12:30  Recommendations:  Rec admission for further w/u and monitoring. Possible TIA, stroke vs transient hypoperfusion with previous strokes Consider vascualr surgery consult for left ICA stenosis Permissive HTN for 24 hours, consider IVF given hypotension Consider repeat MRI brain to assess, whether this is another stroke/TIA or hypoperfusion to optomize medical management Continue statin, antiplatelet therapy  tele monitoring Bedside swallow evaluation HOB less than 30 degrees IV Fluid hydration with NS Euglycemia avoid hyperthermia, PRN acetaminophen dvt ppx  Consider inpatient neurology consultation Discussed with ED MD Please call with questions  -----------------------------------------------------------------------------------------  CC stroke alert  History of Present Illness   77 yo female with PMH of aortic insufficiency,  b/l carotid stenosis as ntoed below, cirrhosis, HTN, HLD, OSA, documented seizures, DM II, esophageal varices, chronic right MCA stroke, recent left perirolandic stroke (?with left sided weakness) who p/w transient Left facial droop and dysarthria in the setting of hypotension SBP 60-70s.She was at home with the visitng nurse when she suddenly felt unwell, with slurred speech and left facial droop. Her SBP at the time was 70s and 60s. She felt dizziness and generalized weakness. She feels that her eft sided weakness from recent admission had resolved.  Sympotms have significant improved, minimally disarticulate speech per granddaughter at bedside  Diagnostic: No ICH on CTH, stable study  Exam: Patient is in no apparent distress. Patient appears as stated age. No obvious acute respiratory or cardiac distress. Patient is well groomed and well-nourished.  NIHSS score:2 NIHSS 1A Level of Consciousness: 0 1B:Orientation questions:0 1C: Response to commands:0 2: Gaze:0 3: Visual fields:0 4: Facial movement:1- minimal right NLF flattening 5A: Motor effort- Left Arm:0 5B: Motor effort- Right Arm:0 6A: Motor effort: Left Leg0 6B: Motor effort Right Leg:0, baseline weakness with foot drop, brace in place 7: Limb ataxia:0 8: Sensory:0 9: Language:0 10: Articulation: 1, minimal dysarthria 11: Extinction or inattentions:0    Medical Decision Making:  - Extensive number of diagnosis or management options are considered above. - Extensive amount of complex data reviewed. - High risk of complication and/or morbidity or mortality are associated with differential diagnostic considerations above.  - There may be Uncertain outcome and increased probability of prolonged functional impairment or high probability of severe prolonged functional impairment associated with some of these differential diagnosis.  Medical Data Reviewed:  1.Data reviewed include clinical labs, radiology,Medical Tests; 2.Tests  results discussed w/performing or interpreting physician; 3.Obtaining/reviewing old medical records;  4.Obtaining case history from another source;  5.Independent review of image, tracing or specimen.   Patient was informed the Neurology Consult  would happen via TeleHealth consult by way of interactive audio and video telecommunications and consented to receiving care in this manner.

## 2018-05-19 NOTE — Progress Notes (Signed)
CODE STROKE CALL [place] EXAM ENDED/IMAGES TO SOC 1308 COMPLETED IN Epic [place]

## 2018-05-19 NOTE — ED Triage Notes (Signed)
Patient complains of slurred speech starting exactly at 1230 today. In triage patient has left sided facial droop. Patient is alert and oriented x 4.

## 2018-05-19 NOTE — ED Notes (Signed)
Unable to update vital signs due to pt in MRI. Will update upon return.

## 2018-05-19 NOTE — ED Notes (Signed)
Pt assisted to bathroom without incident. Pt waiting on discharge paper work

## 2018-05-19 NOTE — Discharge Instructions (Signed)
1)Resume Aspirin and Plavix together through 06/17/2018 then stop the aspirin and continue Plavix only after that 2)You are taking aspirin and Plavix both of which are blood thinners so Avoid ibuprofen/Advil/Aleve/Motrin/Goody Powders/Naproxen/BC powders/Meloxicam/Diclofenac/Indomethacin and other Nonsteroidal anti-inflammatory medications as these will make you more likely to bleed and can cause stomach ulcers, can also cause Kidney problems.  3)STOP isosorbide/Imdur due to concerns about low blood pressure episodes 4) check your blood pressure and pulse twice a day, keep a diary, take this diary with you to see your primary care doctor in 3 to 5 days actually can adjust her blood pressure medications as needed 5)See your primary care doctor within the next 3 to 5 days for reevaluation and recheck

## 2018-05-19 NOTE — Consult Note (Signed)
Patient Demographics:    [name], is a 77 y.o. female  MRN: [id number]   DOB - Nov 01, 1940  Admit Date - 05/19/2018  Outpatient Primary MD for the patient is Redmond School, MD   Assessment & Plan:    Principal Problem:   Dysarthria Active Problems:   CAD (coronary artery disease)   Acute CVA (cerebrovascular accident) ([place])   1)Resume Aspirin and Plavix together through 06/17/2018 then stop the aspirin and continue Plavix only after that 2)You are taking aspirin and Plavix both of which are blood thinners so Avoid ibuprofen/Advil/Aleve/Motrin/Goody Powders/Naproxen/BC powders/Meloxicam/Diclofenac/Indomethacin and other Nonsteroidal anti-inflammatory medications as these will make you more likely to bleed and can cause stomach ulcers, can also cause Kidney problems.  3)STOP isosorbide/Imdur due to concerns about low blood pressure episodes 4) check your blood pressure and pulse twice a day, keep a diary, take this diary with you to see your primary care doctor in 3 to 5 days actually can adjust her blood pressure medications as needed 5)See your primary care doctor within the next 3 to 5 days for reevaluation and recheck   Plan:- 1)Dysarthria-patient apparently presented with transient left facial droop and dysarthria in the setting of hypotension with systolic blood pressure in the 70s, symptoms have completely resolved at this time, speech appears to be back to baseline at this time according to patient and his daughter, CT head and MRI brain without  NEW acute findings, continue aspirin and Plavix as outlined above, follow-up with PCP as advised in 3 to 5 days for recheck  2)H/o CVA- Recent acute left lacunar stroke 05/08/19/19  , Resume Aspirin and Plavix together through 06/17/2018 then stop the aspirin and  continue Plavix only after that, continue Lipitor 80 mg daily, continue niacin 500 mg nightly  3)H/o Liver Cirhosis (NASH) with chronic thrombocytopenia, avoid hepatotoxic agents, avoid NSAIDs  4)AKI--- acute kidney injury , recent creatinine levels between 0.9-1.4 , creatinine 1.3 in the setting of transient hypotension, patient received IV fluids in the ED, may follow-up with PCP for repeat BMP in 3 to 5 days  5)Chronic Anemia--- patient has normocytic and normochromic anemia, hemoglobin is currently stable around 10, no evidence of acute bleeding at this time  6)HTN-continue Coreg due to history of CAD, stop isosorbide due to transient hypotension, patient will keep a BP diary and follow-up with PCP for  further BP meds adjustment  7)Hyperglycemia----A1c from 05/08/2018 with 5.4, prior A1c from 08/25/2017 was 4.7, patient appears to have episodes of Hypoglycemia without frank diabetes mellitus  8)Carotid Artery Stenosis- followed up with Dr. Oneida Alar the vascular surgeon on 05/13/2018 and was told that her carotid artery stenosis did not need intervention  9)H/o SZ-continue Keppra, Patient has a history of seizure but no seizure episode today, no postictal state, no incontinence or tongue biting  With History of - Reviewed by me  Past Medical History:  Diagnosis Date  . Anemia   . Aortic insufficiency  Moderate  . Back pain   . Carotid stenosis, bilateral   . Cirrhosis ([place])   . Closed left femoral fracture ([place]) 04/21/2017  . Coronary artery disease    Stent x 2 RCA 1995, cardiac catheterization 09/2016 showing only mild atherosclerosis  . DDD (degenerative disc disease), lumbar   . Essential hypertension   . Falls   . GERD (gastroesophageal reflux disease)   . Hiatal hernia   . History of stroke   . Hypercholesteremia   . IBS (irritable bowel syndrome)   . Non-alcoholic fatty liver disease   . OSA (obstructive sleep apnea)    no CPAP  . Pericardial effusion    a. small by  echo 2018.  Marland Kitchen PSVT (paroxysmal supraventricular tachycardia) ([place])   . Recurrent UTI   . Seizures ([place])   . Stroke ([place])   . Type 2 diabetes mellitus ([place])    off medication  . Varices, esophageal ([place])       Past Surgical History:  Procedure Laterality Date  . [place]  . APPENDECTOMY    . BACK SURGERY  1985  . CARDIAC CATHETERIZATION  [phone]   Stent to the proximal RCA after MI   . CARDIAC CATHETERIZATION N/A 09/26/2016   Procedure: Left Heart Cath and Coronary Angiography;  Surgeon: Leonie Man, MD;  Location: [facility];  Service: Cardiovascular;  Laterality: N/A;  . CATARACT EXTRACTION W/PHACO Right 07/04/2014   Procedure: CATARACT EXTRACTION PHACO AND INTRAOCULAR LENS PLACEMENT (IOC);  Surgeon: Elta Guadeloupe T. Gershon Crane, MD;  Location: [facility];  Service: Ophthalmology;  Laterality: Right;  CDE:13.13  . COLONOSCOPY    . DILATION AND CURETTAGE OF UTERUS     x2  . Epi Retinal Membrane Peel Left   . ERCP    . ESOPHAGEAL BANDING N/A 04/01/2013   Procedure: ESOPHAGEAL BANDING;  Surgeon: Rogene Houston, MD;  [facility];  Service: Endoscopy;  Laterality: N/A;  . ESOPHAGEAL BANDING N/A 05/24/2013   Procedure: ESOPHAGEAL BANDING;  Surgeon: Rogene Houston, MD;  [facility];  Service: Endoscopy;  Laterality: N/A;  . ESOPHAGEAL BANDING N/A 06/21/2014   Procedure: ESOPHAGEAL BANDING;  Surgeon: Rogene Houston, MD;  [facility];  Service: Endoscopy;  Laterality: N/A;  . ESOPHAGOGASTRODUODENOSCOPY N/A 04/01/2013   Procedure: ESOPHAGOGASTRODUODENOSCOPY (EGD);  Surgeon: Rogene Houston, MD;  [facility];  Service: Endoscopy;  Laterality: N/A;  230-rescheduled to 8:30am Ann notified pt  . ESOPHAGOGASTRODUODENOSCOPY N/A 05/24/2013   Procedure: ESOPHAGOGASTRODUODENOSCOPY (EGD);  Surgeon: Rogene Houston, MD;  [facility];  Service: Endoscopy;  Laterality: N/A;  730  . ESOPHAGOGASTRODUODENOSCOPY N/A 06/21/2014    Procedure: ESOPHAGOGASTRODUODENOSCOPY (EGD);  Surgeon: Rogene Houston, MD;  [facility];  Service: Endoscopy;  Laterality: N/A;  930-rescheduled 10/14 @ 1200 Ann to notify pt  . EYE SURGERY  08   cataract surgery of the left eye  . FEMUR IM NAIL Left 03/02/2017   Procedure: INTRAMEDULLARY (IM) NAIL FEMORAL;  Surgeon: Rod Can, MD;  Location: [facility];  Service: Orthopedics;  Laterality: Left;  . HARDWARE REMOVAL Right 01/17/2013   Procedure: REMOVAL OF HARDWARE AND EXCISION ULNAR STYLOID RIGHT WRIST;  Surgeon: Tennis Must, MD;  Location: [facility];  Service: Orthopedics;  Laterality: Right;  . MYRINGOTOMY  2012   both ears  . ORIF FEMUR FRACTURE Left 04/23/2017   Procedure: OPEN REDUCTION INTERNAL FIXATION (ORIF) DISTAL FEMUR FRACTURE (FRACTURE AROUND  FEMORAL NAIL);  Surgeon: Altamese Abbeville, MD;  Location: [facility];  Service: Orthopedics;  Laterality: Left;  . TONSILLECTOMY    . VAGINAL HYSTERECTOMY  1972  . WRIST SURGERY     rt wrist hardwear removal    Chief Complaint  Patient presents with  . Code Stroke      HPI:    [name]  is a 76 y.o. female with past medical history relevant for CAD,  SZ disorder, recent CVA on 04/20/2018, history of liver cirrhosis (NASH) with chronic anemia and thrombocytopenia, who presented with transient left-sided numbness and speech disturbance that resolved completely while in the ED.   In ED--- telemetry neuro evaluation did not reveal any acute findings patient was deemed not to be a candidate for TPA, CT head and brain MRI without New acute findings  Initially patient had transient hypotension with systolic blood pressure in the 70s recovered well with IV fluids  ED provider initially called for hospitalist evaluation prior to MRI being done, when MRI brain report was available, patient requested to be discharged home given negative MRI and recent extensive neuro work-up less than 2 weeks ago  Please note the patient  was admitted on 05/07/2018 and discharged 05/08/2018 and had a complete stroke work-up at the time, subsequently patient followed up with Dr. Oneida Alar the vascular surgeon on 05/13/2018 and was told that her carotid artery stenosis did not need intervention  Additional history obtained from patient's daughter at bedside  Patient has a history of seizure but no seizure episode today, no postictal state, no incontinence or tongue biting   Review of systems:    In addition to the HPI above,   A full Review of  Systems was done, all other systems reviewed are negative except as noted above in HPI    Social History:  Reviewed by me   Social History   Tobacco Use  . Smoking status: Current Some Day Smoker    Packs/day: 0.25    Years: 50.00    Pack years: 12.50    Types: Cigarettes  . Smokeless tobacco: Never Used  . Tobacco comment: 1 pk lasts a week.   Substance Use Topics  . Alcohol use: No    Alcohol/week: 0.0 standard drinks     Family History :  Reviewed by me   Family History  Problem Relation Age of Onset  . Diabetes Mother   . Heart failure Father   . Heart failure Maternal Aunt     Home Medications:   Prior to Admission medications   Medication Sig Start Date End Date Taking? Authorizing Provider  aspirin EC 81 MG tablet Take 81 mg by mouth daily.   Yes Jake Samples, PA-C  atorvastatin (LIPITOR) 80 MG tablet Take 1 tablet (80 mg total) by mouth daily. 05/08/18  Yes Kathie Dike, MD  carvedilol (COREG) 12.5 MG tablet Take 1 tablet (12.5 mg total) by mouth 2 (two) times daily with a meal. Patient taking differently: Take 12.5 mg by mouth daily.  08/26/17  Yes Tat, Shanon Brow, MD  clopidogrel (PLAVIX) 75 MG tablet Take 1 tablet (75 mg total) by mouth daily with breakfast. 05/09/18  Yes Kathie Dike, MD  HYDROcodone-acetaminophen (NORCO) 10-325 MG tablet Take 1 tablet by mouth 4 (four) times daily as needed. 05/14/18  Yes [provider]  isosorbide mononitrate  (IMDUR) 30 MG 24 hr tablet Take 30 mg by mouth daily.    Yes Jake Samples, PA-C  levETIRAcetam (KEPPRA) 500 MG  tablet Take 1 tablet (500 mg total) by mouth 2 (two) times daily. 05/08/18  Yes Kathie Dike, MD  LORazepam (ATIVAN) 1 MG tablet Take 1 tablet by mouth daily as needed. 04/14/18  Yes [provider]  Multiple Vitamins-Minerals (CENTRUM ADULTS) TABS Take 1 tablet by mouth daily.   Yes [provider]  niacin 500 MG tablet Take 500 mg by mouth at bedtime.   Yes [provider]  tiZANidine (ZANAFLEX) 4 MG tablet Take 1 tablet by mouth 3 (three) times daily. 05/17/18  Yes [provider]  omeprazole (PRILOSEC) 20 MG capsule Take 1 capsule (20 mg total) by mouth daily. Patient not taking: Reported on 05/19/2018 03/05/18   Vaughan Basta, Rona Ravens, NP  ondansetron (ZOFRAN) 4 MG tablet Take 1 tablet (4 mg total) by mouth every 8 (eight) hours as needed for nausea or vomiting. Patient not taking: Reported on 05/19/2018 01/26/18   Butch Penny, NP  ranitidine (ZANTAC) 150 MG tablet Take 1 tablet by mouth 2 (two) times daily as needed. 03/19/18   [provider]     Allergies:     Allergies  Allergen Reactions  . Tape Rash and Other (See Comments)    Please use paper tape     Physical Exam:   Vitals  Blood pressure (!) 139/55, pulse 67, temperature 97.8 F (36.6 C), resp. rate 19, height 5' 3"  (1.6 m), weight 64.9 kg, SpO2 96 %.  Physical Examination: General appearance - alert, well appearing, and in no distress  Mental status - alert, oriented to person, place, and time,  Eyes - sclera anicteric Neck - supple, no JVD elevation ,  Chest - clear  to auscultation bilaterally, symmetrical air movement,  Heart - S1 and S2 normal,  Abdomen - soft, nontender, nondistended, no masses or organomegaly Neurological - screening mental status exam normal, neck supple without rigidity, cranial nerves II through XII intact, DTR's normal and symmetric, no  new acute neuro deficits at this time Extremities - no pedal edema noted, intact peripheral pulses  Skin - warm, dry    Data Review:    CBC Recent Labs  Lab 05/19/18 1311 05/19/18 1339  WBC  --  4.9  HGB 9.5* 10.0*  HCT 28.0* 30.7*  PLT  --  95*  MCV  --  97.2  MCH  --  31.6  MCHC  --  32.6  RDW  --  13.8  LYMPHSABS  --  1.3  MONOABS  --  0.4  EOSABS  --  0.1  BASOSABS  --  0.0   ------------------------------------------------------------------------------------------------------------------ Chemistries  Recent Labs  Lab 05/19/18 1311 05/19/18 1339  NA 139 140  K 4.6 4.0  CL 104 107  CO2  --  25  GLUCOSE 267* 235*  BUN 20 16  CREATININE 1.30* 1.30*  CALCIUM  --  8.7*  AST  --  27  ALT  --  21  ALKPHOS  --  86  BILITOT  --  1.2   ------------------------------------------------------------------------------------------------------------------ estimated creatinine clearance is 33.9 mL/min (A) (by C-G formula based on SCr of 1.3 mg/dL (H)). ------------------------------------------------------------------------------------------------------------------ No results for input(s): TSH, T4TOTAL, T3FREE, THYROIDAB in the last 72 hours.  Invalid input(s): FREET3   Coagulation profile Recent Labs  Lab 05/19/18 1339  INR 1.24   ------------------------------------------------------------------------------------------------------------------- No results for input(s): DDIMER in the last 72 hours. -------------------------------------------------------------------------------------------------------------------  Cardiac Enzymes No results for input(s): CKMB, TROPONINI, MYOGLOBIN in the last 168 hours.  Invalid input(s): CK ------------------------------------------------------------------------------------------------------------------ No results  found for:  BNP  --------------------------------------------------------------------------------------------------------------  Urinalysis    Component Value Date/Time   COLORURINE AMBER (A) 05/19/2018 1425   APPEARANCEUR HAZY (A) 05/19/2018 1425   LABSPEC 1.023 05/19/2018 1425   PHURINE 5.0 05/19/2018 1425   GLUCOSEU NEGATIVE 05/19/2018 1425   HGBUR NEGATIVE 05/19/2018 1425   BILIRUBINUR NEGATIVE 05/19/2018 1425   KETONESUR NEGATIVE 05/19/2018 1425   PROTEINUR NEGATIVE 05/19/2018 1425   UROBILINOGEN 0.2 07/09/2015 0930   NITRITE NEGATIVE 05/19/2018 1425   LEUKOCYTESUR NEGATIVE 05/19/2018 1425   ----------------------------------------------------------------------------------------------------------------   Imaging Results:    Mr Brain Wo Contrast (neuro Protocol)  Result Date: 05/19/2018 CLINICAL DATA:  Recent acute lacunar stroke 04/27/2018. Left facial droop and dysarthria. EXAM: MRI HEAD WITHOUT CONTRAST TECHNIQUE: Multiplanar, multiecho pulse sequences of the brain and surrounding structures were obtained without intravenous contrast. COMPARISON:  CT same day.  MRI 05/07/2018. FINDINGS: Brain: Subcentimeter acute/subacute infarction of the cortical/subcortical brain at the left frontoparietal vertex is again demonstrated. This appears similar on diffusion imaging but is more apparent on T2 and FLAIR imaging. No other recent insult is seen. Elsewhere, there chronic small-vessel ischemic changes of the pons and cerebellum. There is an old lacunar infarction or dilated perivascular space at the base of the brain on the right. There are old small vessel basal ganglia and thalamic infarctions. There is old infarction in the right insula and posterior frontal lobe no sign of mass lesion, hemorrhage, hydrocephalus or extra-axial collection. Vascular: Major vessels at the base of the brain show flow. Skull and upper cervical spine: Negative Sinuses/Orbits: Clear/normal Other: None IMPRESSION: No new  finding since the previous study. The subcentimeter cortical/subcortical infarction at the left frontoparietal vertex is redemonstrated, more conspicuous on the parenchymal imaging today based on maturation of the infarction. Old ischemic changes as seen previously including an old right MCA branch vessel infarction affecting the insula and posterior frontal cortical and subcortical brain and numerous small vessel infarctions affecting the pons, cerebellum, thalami, basal ganglia and hemispheric white matter. Electronically Signed   By: Nelson Chimes M.D.   On: 05/19/2018 15:29   Ct Head Code Stroke Wo Contrast  Result Date: 05/19/2018 CLINICAL DATA:  Code stroke. Speech difficulty. Slurred speech left facial droop EXAM: CT HEAD WITHOUT CONTRAST TECHNIQUE: Contiguous axial images were obtained from the base of the skull through the vertex without intravenous contrast. COMPARISON:  MRI head 05/07/2018, CT 05/07/2018 FINDINGS: Brain: Negative for acute infarct. Negative for acute hemorrhage or mass Chronic infarct right frontal operculum. Chronic ischemia internal capsule bilaterally. Benign-appearing cyst right putamen. Small chronic infarcts right cerebellum. Ventricle size normal. Vascular: Negative for hyperdense vessel Skull: Negative Sinuses/Orbits: Paranasal sinuses clear.  Bilateral cataract surgery Other: None ASPECTS ([place] Stroke Program Early CT Score) - Ganglionic level infarction (caudate, lentiform nuclei, internal capsule, insula, M1-M3 cortex): 7 - Supraganglionic infarction (M4-M6 cortex): 3 Total score (0-10 with 10 being normal): 10 IMPRESSION: 1. No acute abnormality 2. Chronic ischemic changes stable from the prior study. 3. ASPECTS is 10 4. These results were called by telephone at the time of interpretation on 05/19/2018 at 1:20 pm to Dr. Francine Graven , who verbally acknowledged these results. Electronically Signed   By: Franchot Gallo M.D.   On: 05/19/2018 13:21    Radiological  Exams on Admission: Mr Brain Wo Contrast (neuro Protocol)  Result Date: 05/19/2018 CLINICAL DATA:  Recent acute lacunar stroke 04/27/2018. Left facial droop and dysarthria. EXAM: MRI HEAD WITHOUT CONTRAST TECHNIQUE: Multiplanar, multiecho pulse sequences of the brain  and surrounding structures were obtained without intravenous contrast. COMPARISON:  CT same day.  MRI 05/07/2018. FINDINGS: Brain: Subcentimeter acute/subacute infarction of the cortical/subcortical brain at the left frontoparietal vertex is again demonstrated. This appears similar on diffusion imaging but is more apparent on T2 and FLAIR imaging. No other recent insult is seen. Elsewhere, there chronic small-vessel ischemic changes of the pons and cerebellum. There is an old lacunar infarction or dilated perivascular space at the base of the brain on the right. There are old small vessel basal ganglia and thalamic infarctions. There is old infarction in the right insula and posterior frontal lobe no sign of mass lesion, hemorrhage, hydrocephalus or extra-axial collection. Vascular: Major vessels at the base of the brain show flow. Skull and upper cervical spine: Negative Sinuses/Orbits: Clear/normal Other: None IMPRESSION: No new finding since the previous study. The subcentimeter cortical/subcortical infarction at the left frontoparietal vertex is redemonstrated, more conspicuous on the parenchymal imaging today based on maturation of the infarction. Old ischemic changes as seen previously including an old right MCA branch vessel infarction affecting the insula and posterior frontal cortical and subcortical brain and numerous small vessel infarctions affecting the pons, cerebellum, thalami, basal ganglia and hemispheric white matter. Electronically Signed   By: Nelson Chimes M.D.   On: 05/19/2018 15:29   Ct Head Code Stroke Wo Contrast  Result Date: 05/19/2018 CLINICAL DATA:  Code stroke. Speech difficulty. Slurred speech left facial droop EXAM:  CT HEAD WITHOUT CONTRAST TECHNIQUE: Contiguous axial images were obtained from the base of the skull through the vertex without intravenous contrast. COMPARISON:  MRI head 05/07/2018, CT 05/07/2018 FINDINGS: Brain: Negative for acute infarct. Negative for acute hemorrhage or mass Chronic infarct right frontal operculum. Chronic ischemia internal capsule bilaterally. Benign-appearing cyst right putamen. Small chronic infarcts right cerebellum. Ventricle size normal. Vascular: Negative for hyperdense vessel Skull: Negative Sinuses/Orbits: Paranasal sinuses clear.  Bilateral cataract surgery Other: None ASPECTS ([place] Stroke Program Early CT Score) - Ganglionic level infarction (caudate, lentiform nuclei, internal capsule, insula, M1-M3 cortex): 7 - Supraganglionic infarction (M4-M6 cortex): 3 Total score (0-10 with 10 being normal): 10 IMPRESSION: 1. No acute abnormality 2. Chronic ischemic changes stable from the prior study. 3. ASPECTS is 10 4. These results were called by telephone at the time of interpretation on 05/19/2018 at 1:20 pm to Dr. Francine Graven , who verbally acknowledged these results. Electronically Signed   By: Franchot Gallo M.D.   On: 05/19/2018 13:21    DVT Prophylaxis -SCD   AM Labs Ordered, also please review Full Orders  Family Communication: Admission, patients condition and plan of care including tests being ordered have been discussed with the patient and daughter at bedside who indicate understanding and agree with the plan   Code Status - Full Code  Likely DC to  home  Condition   stable  Roxan Hockey M.D on 05/19/2018 at 5:28 PM Pager---[phone] Go to [email or web address] - [email or web address] for contact info  [facility] - Office  [phone]

## 2018-05-19 NOTE — ED Notes (Signed)
Patient transported to MRI 

## 2018-05-19 NOTE — ED Provider Notes (Signed)
[facility] EMERGENCY DEPARTMENT Provider Note   CSN: [id number] Arrival date & time: 05/19/18  1252   An emergency department physician performed an initial assessment on this suspected stroke patient at 1302.  History   Chief Complaint Chief Complaint  Patient presents with  . Code Stroke    HPI [name] is a 77 y.o. female.  The history is provided by the patient. The history is limited by the condition of the patient (Urgent need for intervention).  Pt was seen at 1300. Per pt: States she began to have left sided facial droop and slurred speech at 1230 PTA. Pt states she had similar symptoms 2 weeks ago, dx TIA. Code Stroke called on pt arrival.   Past Medical History:  Diagnosis Date  . Anemia   . Aortic insufficiency    Moderate  . Back pain   . Carotid stenosis, bilateral   . Cirrhosis ([place])   . Closed left femoral fracture ([place]) 04/21/2017  . Coronary artery disease    Stent x 2 RCA 1995, cardiac catheterization 09/2016 showing only mild atherosclerosis  . DDD (degenerative disc disease), lumbar   . Essential hypertension   . Falls   . GERD (gastroesophageal reflux disease)   . Hiatal hernia   . History of stroke   . Hypercholesteremia   . IBS (irritable bowel syndrome)   . Non-alcoholic fatty liver disease   . OSA (obstructive sleep apnea)    no CPAP  . Pericardial effusion    a. small by echo 2018.  Marland Kitchen PSVT (paroxysmal supraventricular tachycardia) ([place])   . Recurrent UTI   . Seizures ([place])   . Stroke ([place])   . Type 2 diabetes mellitus ([place])    off medication  . Varices, esophageal [facility])     Patient Active Problem List   Diagnosis Date Noted  . Acute CVA (cerebrovascular accident) ([place]) 05/07/2018  . Syncope and collapse   . Syncope 08/24/2017  . Closed left femoral fracture ([place]) 04/21/2017  . Seizure disorder ([place]) 04/21/2017  . Closed pertrochanteric fracture of femur, left, initial encounter ([place]) 03/02/2017  . Seizure ([place]) 02/28/2017    . Hip fracture ([place]) 02/28/2017  . Chest pain 12/07/2016  . Atypical chest pain 12/07/2016  . Tachycardia   . NSTEMI (non-ST elevated myocardial infarction) ([place]) 11/02/2016  . Multifocal atrial tachycardia ([place]) 09/25/2016  . Elevated troponin 09/24/2016  . Hypokalemia 09/23/2016  . TIA (transient ischemic attack) 05/05/2016  . Intractable nausea and vomiting 10/15/2015  . Acute coronary syndrome ([place]) 06/01/2015  . Bronchitis 06/01/2015  . Thrombocytopenia ([place]) 06/01/2015  . Cerebral infarction ([place]) 03/06/2014  . Lower urinary tract infectious disease   . PSVT (paroxysmal supraventricular tachycardia) ([place]) 12/09/2013  . Esophageal varices ([place]) 03/29/2013  . Hematemesis 03/29/2013  . Hepatic cirrhosis ([place]) 03/29/2013  . Presence of stent in right coronary artery 07/04/2011  . OSA (obstructive sleep apnea) 07/04/2011  . Aortic sclerosis 07/04/2011  . Aortic insufficiency 07/04/2011  . HTN (hypertension) 07/04/2011  . Carotid stenosis, bilateral 07/04/2011  . Chest pain at rest 07/03/2011  . CAD (coronary artery disease) 07/03/2011  . DM type 2 (diabetes mellitus, type 2) ([place]) 07/03/2011  . Hypercholesteremia 07/03/2011  . GERD (gastroesophageal reflux disease) 07/03/2011    Past Surgical History:  Procedure Laterality Date  . [place]  . APPENDECTOMY    . BACK SURGERY  1985  . CARDIAC CATHETERIZATION  [phone]   Stent to the proximal RCA after MI   .  CARDIAC CATHETERIZATION N/A 09/26/2016   Procedure: Left Heart Cath and Coronary Angiography;  Surgeon: Leonie Man, MD;  Location: [place] CV LAB;  Service: Cardiovascular;  Laterality: N/A;  . CATARACT EXTRACTION W/PHACO Right 07/04/2014   Procedure: CATARACT EXTRACTION PHACO AND INTRAOCULAR LENS PLACEMENT (IOC);  Surgeon: Elta Guadeloupe T. Gershon Crane, MD;  Location: [facility] ORS;  Service: Ophthalmology;  Laterality: Right;  CDE:13.13  . COLONOSCOPY    . DILATION AND CURETTAGE OF UTERUS     x2  . Epi  Retinal Membrane Peel Left   . ERCP    . ESOPHAGEAL BANDING N/A 04/01/2013   Procedure: ESOPHAGEAL BANDING;  Surgeon: Rogene Houston, MD;  Location: [facility] ENDO SUITE;  Service: Endoscopy;  Laterality: N/A;  . ESOPHAGEAL BANDING N/A 05/24/2013   Procedure: ESOPHAGEAL BANDING;  Surgeon: Rogene Houston, MD;  Location[facility];  Service: Endoscopy;  Laterality: N/A;  . ESOPHAGEAL BANDING N/A 06/21/2014   Procedure: ESOPHAGEAL BANDING;  Surgeon: Rogene Houston, MD;  [facility];  Service: Endoscopy;  Laterality: N/A;  . ESOPHAGOGASTRODUODENOSCOPY N/A 04/01/2013   Procedure: ESOPHAGOGASTRODUODENOSCOPY (EGD);  Surgeon: Rogene Houston, MD;  [facility];  Service: Endoscopy;  Laterality: N/A;  230-rescheduled to 8:30am Ann notified pt  . ESOPHAGOGASTRODUODENOSCOPY N/A 05/24/2013   Procedure: ESOPHAGOGASTRODUODENOSCOPY (EGD);  Surgeon: Rogene Houston, MD;  [facility];  Service: Endoscopy;  Laterality: N/A;  730  . ESOPHAGOGASTRODUODENOSCOPY N/A 06/21/2014   Procedure: ESOPHAGOGASTRODUODENOSCOPY (EGD);  Surgeon: Rogene Houston, MD;  [facility]: [facility];  Service: Endoscopy;  Laterality: N/A;  930-rescheduled 10/14 @ 1200 Ann to notify pt  . EYE SURGERY  08   cataract surgery of the left eye  . FEMUR IM NAIL Left 03/02/2017   Procedure: INTRAMEDULLARY (IM) NAIL FEMORAL;  Surgeon: Rod Can, MD;  Location: [place];  Service: Orthopedics;  Laterality: Left;  . HARDWARE REMOVAL Right 01/17/2013   Procedure: REMOVAL OF HARDWARE AND EXCISION ULNAR STYLOID RIGHT WRIST;  Surgeon: Tennis Must, MD;  Location: [place];  Service: Orthopedics;  Laterality: Right;  . MYRINGOTOMY  2012   both ears  . ORIF FEMUR FRACTURE Left 04/23/2017   Procedure: OPEN REDUCTION INTERNAL FIXATION (ORIF) DISTAL FEMUR FRACTURE (FRACTURE AROUND FEMORAL NAIL);  Surgeon: Altamese Cove, MD;  Location: [place];  Service: Orthopedics;  Laterality: Left;  . TONSILLECTOMY    .  VAGINAL HYSTERECTOMY  1972  . WRIST SURGERY     rt wrist hardwear removal     OB History   None      Home Medications    Prior to Admission medications   Medication Sig Start Date End Date Taking? Authorizing Provider  aspirin EC 81 MG tablet Take 81 mg by mouth daily.   Yes Jake Samples, PA-C  atorvastatin (LIPITOR) 80 MG tablet Take 1 tablet (80 mg total) by mouth daily. 05/08/18  Yes Kathie Dike, MD  carvedilol (COREG) 12.5 MG tablet Take 1 tablet (12.5 mg total) by mouth 2 (two) times daily with a meal. Patient taking differently: Take 12.5 mg by mouth daily.  08/26/17  Yes Tat, Shanon Brow, MD  clopidogrel (PLAVIX) 75 MG tablet Take 1 tablet (75 mg total) by mouth daily with breakfast. 05/09/18  Yes Kathie Dike, MD  HYDROcodone-acetaminophen (NORCO) 10-325 MG tablet Take 1 tablet by mouth 4 (four) times daily as needed. 05/14/18  Yes [provider]  isosorbide mononitrate (IMDUR) 30 MG 24 hr tablet Take 30 mg by  mouth daily.    Yes Jake Samples, PA-C  levETIRAcetam (KEPPRA) 500 MG tablet Take 1 tablet (500 mg total) by mouth 2 (two) times daily. 05/08/18  Yes Kathie Dike, MD  LORazepam (ATIVAN) 1 MG tablet Take 1 tablet by mouth daily as needed. 04/14/18  Yes [provider]  Multiple Vitamins-Minerals (CENTRUM ADULTS) TABS Take 1 tablet by mouth daily.   Yes [provider]  niacin 500 MG tablet Take 500 mg by mouth at bedtime.   Yes [provider]  tiZANidine (ZANAFLEX) 4 MG tablet Take 1 tablet by mouth 3 (three) times daily. 05/17/18  Yes [provider]  omeprazole (PRILOSEC) 20 MG capsule Take 1 capsule (20 mg total) by mouth daily. Patient not taking: Reported on 05/19/2018 03/05/18   Vaughan Basta, Rona Ravens, NP  ondansetron (ZOFRAN) 4 MG tablet Take 1 tablet (4 mg total) by mouth every 8 (eight) hours as needed for nausea or vomiting. Patient not taking: Reported on 05/19/2018 01/26/18   Butch Penny, NP  ranitidine (ZANTAC)  150 MG tablet Take 1 tablet by mouth 2 (two) times daily as needed. 03/19/18   [provider]    Family History Family History  Problem Relation Age of Onset  . Diabetes Mother   . Heart failure Father   . Heart failure Maternal Aunt     Social History Social History   Tobacco Use  . Smoking status: Current Some Day Smoker    Packs/day: 0.25    Years: 50.00    Pack years: 12.50    Types: Cigarettes  . Smokeless tobacco: Never Used  . Tobacco comment: 1 pk lasts a week.   Substance Use Topics  . Alcohol use: No    Alcohol/week: 0.0 standard drinks  . Drug use: No     Allergies   Tape   Review of Systems Review of Systems  Unable to perform ROS: Acuity of condition     Physical Exam Updated Vital Signs BP (!) 135/46 (BP Location: Left Arm)   Pulse 74   Temp 97.8 F (36.6 C)   Resp 15   Ht 5' 3"  (1.6 m)   Wt 64.9 kg   SpO2 98%   BMI 25.35 kg/m   Physical Exam Physical examination:  Nursing notes reviewed; Vital signs and O2 SAT reviewed;  Constitutional: Well developed, Well nourished, Well hydrated, In no acute distress; Head:  Normocephalic, atraumatic; Eyes: EOMI, PERRL, No scleral icterus; ENMT: Mouth and pharynx normal, Mucous membranes moist; Neck: Supple, Full range of motion, No lymphadenopathy; Cardiovascular: Regular rate and rhythm, No gallop; Respiratory: Breath sounds clear & equal bilaterally, No wheezes.  Speaking full sentences with ease, Normal respiratory effort/excursion; Chest: Nontender, Movement normal; Abdomen: Soft, Nontender, Nondistended, Normal bowel sounds; Genitourinary: No CVA tenderness; Extremities: Peripheral pulses normal, No tenderness, No edema, No calf edema or asymmetry.; Neuro: AA&Ox3, Major CN grossly intact. +left sided facial droop. Speech slurred. Grips equal. Strength 5/5 equal bilat UE's and LE's. No gross focal motor or sensory deficits in extremities.; Skin: Color normal, Warm, Dry.   ED Treatments / Results    Labs (all labs ordered are listed, but only abnormal results are displayed)   EKG EKG Interpretation  Date/Time:  Wednesday May 19 2018 13:25:43 EDT Ventricular Rate:  58 PR Interval:    QRS Duration: 96 QT Interval:  488 QTC Calculation: 480 R Axis:   45 Text Interpretation:  Sinus rhythm Borderline prolonged PR interval When compared with ECG of 05/07/2018  No significant change was found Confirmed by Francine Graven [phone]) on 05/19/2018 3:57:46 PM   Radiology   Procedures Procedures (including critical care time)  Medications Ordered in ED Medications  0.9 %  sodium chloride infusion ( Intravenous Rate/Dose Verify 05/19/18 1537)     Initial Impression / Assessment and Plan / ED Course  I have reviewed the triage vital signs and the nursing notes.  Pertinent labs & imaging results that were available during my care of the patient were reviewed by me and considered in my medical decision making (see chart for details).  MDM Reviewed: previous chart, nursing note and vitals Reviewed previous: labs and ECG Interpretation: labs, ECG, MRI and CT scan Total time providing critical care: 30-74 minutes. This excludes time spent performing separately reportable procedures and services. Consults: neurology and admitting MD   CRITICAL CARE Performed by: Francine Graven Total critical care time: 35 minutes Critical care time was exclusive of separately billable procedures and treating other patients. Critical care was necessary to treat or prevent imminent or life-threatening deterioration. Critical care was time spent personally by me on the following activities: development of treatment plan with patient and/or surrogate as well as nursing, discussions with consultants, evaluation of patient's response to treatment, examination of patient, obtaining history from patient or surrogate, ordering and performing treatments and interventions, ordering and review of laboratory  studies, ordering and review of radiographic studies, pulse oximetry and re-evaluation of patient's condition.  Results for orders placed or performed during the hospital encounter of 05/19/18  Ethanol  Result Value Ref Range   Alcohol, Ethyl (B) <10 <10 mg/dL  Protime-INR  Result Value Ref Range   Prothrombin Time 15.5 (H) 11.4 - 15.2 seconds   INR 1.24   APTT  Result Value Ref Range   aPTT 29 24 - 36 seconds  CBC  Result Value Ref Range   WBC 4.9 4.0 - 10.5 K/uL   RBC 3.16 (L) 3.87 - 5.11 MIL/uL   Hemoglobin 10.0 (L) 12.0 - 15.0 g/dL   HCT 30.7 (L) 36.0 - 46.0 %   MCV 97.2 78.0 - 100.0 fL   MCH 31.6 26.0 - 34.0 pg   MCHC 32.6 30.0 - 36.0 g/dL   RDW 13.8 11.5 - 15.5 %   Platelets 95 (L) 150 - 400 K/uL  Differential  Result Value Ref Range   Neutrophils Relative % 63 %   Lymphocytes Relative 26 %   Monocytes Relative 8 %   Eosinophils Relative 3 %   Basophils Relative 0 %   Neutro Abs 3.1 1.7 - 7.7 K/uL   Lymphs Abs 1.3 0.7 - 4.0 K/uL   Monocytes Absolute 0.4 0.1 - 1.0 K/uL   Eosinophils Absolute 0.1 0.0 - 0.7 K/uL   Basophils Absolute 0.0 0.0 - 0.1 K/uL   Smear Review PLATELET COUNT CONFIRMED BY SMEAR   Comprehensive metabolic panel  Result Value Ref Range   Sodium 140 135 - 145 mmol/L   Potassium 4.0 3.5 - 5.1 mmol/L   Chloride 107 98 - 111 mmol/L   CO2 25 22 - 32 mmol/L   Glucose, Bld 235 (H) 70 - 99 mg/dL   BUN 16 8 - 23 mg/dL   Creatinine, Ser 1.30 (H) 0.44 - 1.00 mg/dL   Calcium 8.7 (L) 8.9 - 10.3 mg/dL   Total Protein 5.3 (L) 6.5 - 8.1 g/dL   Albumin 3.1 (L) 3.5 - 5.0 g/dL   AST 27 15 - 41 U/L   ALT 21 0 -  44 U/L   Alkaline Phosphatase 86 38 - 126 U/L   Total Bilirubin 1.2 0.3 - 1.2 mg/dL   GFR calc non Af Amer 39 (L) >60 mL/min   GFR calc Af Amer 45 (L) >60 mL/min   Anion gap 8 5 - 15  Urine rapid drug screen (hosp performed)not at [facility]  Result Value Ref Range   Opiates NONE DETECTED NONE DETECTED   Cocaine NONE DETECTED NONE DETECTED    Benzodiazepines POSITIVE (A) NONE DETECTED   Amphetamines NONE DETECTED NONE DETECTED   Tetrahydrocannabinol NONE DETECTED NONE DETECTED   Barbiturates NONE DETECTED NONE DETECTED  Urinalysis, Routine w reflex microscopic  Result Value Ref Range   Color, Urine AMBER (A) YELLOW   APPearance HAZY (A) CLEAR   Specific Gravity, Urine 1.023 1.005 - 1.030   pH 5.0 5.0 - 8.0   Glucose, UA NEGATIVE NEGATIVE mg/dL   Hgb urine dipstick NEGATIVE NEGATIVE   Bilirubin Urine NEGATIVE NEGATIVE   Ketones, ur NEGATIVE NEGATIVE mg/dL   Protein, ur NEGATIVE NEGATIVE mg/dL   Nitrite NEGATIVE NEGATIVE   Leukocytes, UA NEGATIVE NEGATIVE  I-Stat Chem 8, ED  (not at [facility])  Result Value Ref Range   Sodium 139 135 - 145 mmol/L   Potassium 4.6 3.5 - 5.1 mmol/L   Chloride 104 98 - 111 mmol/L   BUN 20 8 - 23 mg/dL   Creatinine, Ser 1.30 (H) 0.44 - 1.00 mg/dL   Glucose, Bld 267 (H) 70 - 99 mg/dL   Calcium, Ion 1.09 (L) 1.15 - 1.40 mmol/L   TCO2 25 22 - 32 mmol/L   Hemoglobin 9.5 (L) 12.0 - 15.0 g/dL   HCT 28.0 (L) 36.0 - 46.0 %  I-stat troponin, ED (not at [facility])  Result Value Ref Range   Troponin i, poc 0.02 0.00 - 0.08 ng/mL   Comment 3          CBG monitoring, ED  Result Value Ref Range   Glucose-Capillary 262 (H) 70 - 99 mg/dL   Mr Brain Wo Contrast (neuro Protocol) Result Date: 05/19/2018 CLINICAL DATA:  Recent acute lacunar stroke 04/27/2018. Left facial droop and dysarthria. EXAM: MRI HEAD WITHOUT CONTRAST TECHNIQUE: Multiplanar, multiecho pulse sequences of the brain and surrounding structures were obtained without intravenous contrast. COMPARISON:  CT same day.  MRI 05/07/2018. FINDINGS: Brain: Subcentimeter acute/subacute infarction of the cortical/subcortical brain at the left frontoparietal vertex is again demonstrated. This appears similar on diffusion imaging but is more apparent on T2 and FLAIR imaging. No other recent insult is seen. Elsewhere, there chronic small-vessel ischemic  changes of the pons and cerebellum. There is an old lacunar infarction or dilated perivascular space at the base of the brain on the right. There are old small vessel basal ganglia and thalamic infarctions. There is old infarction in the right insula and posterior frontal lobe no sign of mass lesion, hemorrhage, hydrocephalus or extra-axial collection. Vascular: Major vessels at the base of the brain show flow. Skull and upper cervical spine: Negative Sinuses/Orbits: Clear/normal Other: None IMPRESSION: No new finding since the previous study. The subcentimeter cortical/subcortical infarction at the left frontoparietal vertex is redemonstrated, more conspicuous on the parenchymal imaging today based on maturation of the infarction. Old ischemic changes as seen previously including an old right MCA branch vessel infarction affecting the insula and posterior frontal cortical and subcortical brain and numerous small vessel infarctions affecting the pons, cerebellum, thalami, basal ganglia and hemispheric white matter. Electronically Signed   By: Elta Guadeloupe  Shogry M.D.   On: 05/19/2018 15:29    Ct Head Code Stroke Wo Contrast Result Date: 05/19/2018 CLINICAL DATA:  Code stroke. Speech difficulty. Slurred speech left facial droop EXAM: CT HEAD WITHOUT CONTRAST TECHNIQUE: Contiguous axial images were obtained from the base of the skull through the vertex without intravenous contrast. COMPARISON:  MRI head 05/07/2018, CT 05/07/2018 FINDINGS: Brain: Negative for acute infarct. Negative for acute hemorrhage or mass Chronic infarct right frontal operculum. Chronic ischemia internal capsule bilaterally. Benign-appearing cyst right putamen. Small chronic infarcts right cerebellum. Ventricle size normal. Vascular: Negative for hyperdense vessel Skull: Negative Sinuses/Orbits: Paranasal sinuses clear.  Bilateral cataract surgery Other: None ASPECTS ([facility] Stroke Program Early CT Score) - Ganglionic level infarction (caudate,  lentiform nuclei, internal capsule, insula, M1-M3 cortex): 7 - Supraganglionic infarction (M4-M6 cortex): 3 Total score (0-10 with 10 being normal): 10 IMPRESSION: 1. No acute abnormality 2. Chronic ischemic changes stable from the prior study. 3. ASPECTS is 10 4. These results were called by telephone at the time of interpretation on 05/19/2018 at 1:20 pm to Dr. Francine Graven , who verbally acknowledged these results. Electronically Signed   By: Franchot Gallo M.D.   On: 05/19/2018 13:21    1330:  Code Stroke called on pt arrival. Tele Neuro MD has evaluated pt: states not TPA candidate at this time, deficits are resolving, start IVF in the ED and admit for further workup.   1445:  H/H within pt's baseline range. BUN/Cr mildly elevated; IVF infusing. MRI pending. T/C returned from [facility] Dr. Denton Brick, case discussed, including:  HPI, pertinent PM/SHx, VS/PE, dx testing, ED course and treatment:  Agreeable to admit.     Final Clinical Impressions(s) / ED Diagnoses   Final diagnoses:  TIA (transient ischemic attack)    ED Discharge Orders    None       Francine Graven, DO 05/23/18 1326

## 2018-05-20 ENCOUNTER — Other Ambulatory Visit: Payer: Self-pay

## 2018-05-20 DIAGNOSIS — M5136 Other intervertebral disc degeneration, lumbar region: Secondary | ICD-10-CM | POA: Diagnosis not present

## 2018-05-20 DIAGNOSIS — Z87891 Personal history of nicotine dependence: Secondary | ICD-10-CM | POA: Diagnosis not present

## 2018-05-20 DIAGNOSIS — R600 Localized edema: Secondary | ICD-10-CM | POA: Diagnosis not present

## 2018-05-20 DIAGNOSIS — E1151 Type 2 diabetes mellitus with diabetic peripheral angiopathy without gangrene: Secondary | ICD-10-CM | POA: Diagnosis not present

## 2018-05-20 DIAGNOSIS — Z8673 Personal history of transient ischemic attack (TIA), and cerebral infarction without residual deficits: Secondary | ICD-10-CM | POA: Diagnosis not present

## 2018-05-20 DIAGNOSIS — Z794 Long term (current) use of insulin: Secondary | ICD-10-CM | POA: Diagnosis not present

## 2018-05-20 DIAGNOSIS — G894 Chronic pain syndrome: Secondary | ICD-10-CM | POA: Diagnosis not present

## 2018-05-20 DIAGNOSIS — R627 Adult failure to thrive: Secondary | ICD-10-CM | POA: Diagnosis not present

## 2018-05-20 DIAGNOSIS — G4709 Other insomnia: Secondary | ICD-10-CM | POA: Diagnosis not present

## 2018-05-20 DIAGNOSIS — Z7902 Long term (current) use of antithrombotics/antiplatelets: Secondary | ICD-10-CM | POA: Diagnosis not present

## 2018-05-20 DIAGNOSIS — K5900 Constipation, unspecified: Secondary | ICD-10-CM | POA: Diagnosis not present

## 2018-05-20 DIAGNOSIS — I10 Essential (primary) hypertension: Secondary | ICD-10-CM | POA: Diagnosis not present

## 2018-05-20 DIAGNOSIS — Z7982 Long term (current) use of aspirin: Secondary | ICD-10-CM | POA: Diagnosis not present

## 2018-05-20 DIAGNOSIS — M1991 Primary osteoarthritis, unspecified site: Secondary | ICD-10-CM | POA: Diagnosis not present

## 2018-05-20 DIAGNOSIS — Z6823 Body mass index (BMI) 23.0-23.9, adult: Secondary | ICD-10-CM | POA: Diagnosis not present

## 2018-05-20 NOTE — Patient Outreach (Signed)
[facility]) Care Management  05/20/2018  [name] 04/01/1941 [id number]    RN Health Coach Notified the patient was hospitalized on 05/07/2018  and discharged 05/08/2018 for TIA.  Patient was followed up on by Case Management.  Plan:  [place] will close program at this time.  A referral has been sent to [facility] diease management department.  The patient was notified and agreed to the program 05/03/2018.   Lazaro Arms RN, BSN, [place] Direct Dial:  [phone]  Fax: [phone]

## 2018-05-24 ENCOUNTER — Other Ambulatory Visit: Payer: Self-pay

## 2018-05-24 DIAGNOSIS — M1991 Primary osteoarthritis, unspecified site: Secondary | ICD-10-CM | POA: Diagnosis not present

## 2018-05-24 DIAGNOSIS — G894 Chronic pain syndrome: Secondary | ICD-10-CM | POA: Diagnosis not present

## 2018-05-24 DIAGNOSIS — Z794 Long term (current) use of insulin: Secondary | ICD-10-CM | POA: Diagnosis not present

## 2018-05-24 DIAGNOSIS — I10 Essential (primary) hypertension: Secondary | ICD-10-CM | POA: Diagnosis not present

## 2018-05-24 DIAGNOSIS — K5900 Constipation, unspecified: Secondary | ICD-10-CM | POA: Diagnosis not present

## 2018-05-24 DIAGNOSIS — Z7982 Long term (current) use of aspirin: Secondary | ICD-10-CM | POA: Diagnosis not present

## 2018-05-24 DIAGNOSIS — Z87891 Personal history of nicotine dependence: Secondary | ICD-10-CM | POA: Diagnosis not present

## 2018-05-24 DIAGNOSIS — R600 Localized edema: Secondary | ICD-10-CM | POA: Diagnosis not present

## 2018-05-24 DIAGNOSIS — G4709 Other insomnia: Secondary | ICD-10-CM | POA: Diagnosis not present

## 2018-05-24 DIAGNOSIS — Z8673 Personal history of transient ischemic attack (TIA), and cerebral infarction without residual deficits: Secondary | ICD-10-CM | POA: Diagnosis not present

## 2018-05-24 DIAGNOSIS — Z6823 Body mass index (BMI) 23.0-23.9, adult: Secondary | ICD-10-CM | POA: Diagnosis not present

## 2018-05-24 DIAGNOSIS — R627 Adult failure to thrive: Secondary | ICD-10-CM | POA: Diagnosis not present

## 2018-05-24 DIAGNOSIS — E1151 Type 2 diabetes mellitus with diabetic peripheral angiopathy without gangrene: Secondary | ICD-10-CM | POA: Diagnosis not present

## 2018-05-24 DIAGNOSIS — Z7902 Long term (current) use of antithrombotics/antiplatelets: Secondary | ICD-10-CM | POA: Diagnosis not present

## 2018-05-24 DIAGNOSIS — M5136 Other intervertebral disc degeneration, lumbar region: Secondary | ICD-10-CM | POA: Diagnosis not present

## 2018-05-24 NOTE — Patient Outreach (Signed)
[facility]) Care Management  05/24/2018  [name] 1940-12-11 [id number]    Received notification that [facility] nurse called the patient to start program and she refused.  I called and spoke with the patient.  HIPAA  Verified. I reminded the pateint about our conversation in August of the benefits of the program.  The patient stated that they can reach back out to her and she will accept being in the program.  Plan: [place] Program is closed.  Lazaro Arms RN, BSN, [place] Direct Dial:  [phone]  Fax: [phone]

## 2018-05-27 ENCOUNTER — Other Ambulatory Visit: Payer: Self-pay

## 2018-05-27 DIAGNOSIS — E1151 Type 2 diabetes mellitus with diabetic peripheral angiopathy without gangrene: Secondary | ICD-10-CM | POA: Diagnosis not present

## 2018-05-27 DIAGNOSIS — M5136 Other intervertebral disc degeneration, lumbar region: Secondary | ICD-10-CM | POA: Diagnosis not present

## 2018-05-27 DIAGNOSIS — Z7982 Long term (current) use of aspirin: Secondary | ICD-10-CM | POA: Diagnosis not present

## 2018-05-27 DIAGNOSIS — Z7902 Long term (current) use of antithrombotics/antiplatelets: Secondary | ICD-10-CM | POA: Diagnosis not present

## 2018-05-27 DIAGNOSIS — Z87891 Personal history of nicotine dependence: Secondary | ICD-10-CM | POA: Diagnosis not present

## 2018-05-27 DIAGNOSIS — Z6823 Body mass index (BMI) 23.0-23.9, adult: Secondary | ICD-10-CM | POA: Diagnosis not present

## 2018-05-27 DIAGNOSIS — Z8673 Personal history of transient ischemic attack (TIA), and cerebral infarction without residual deficits: Secondary | ICD-10-CM | POA: Diagnosis not present

## 2018-05-27 DIAGNOSIS — I10 Essential (primary) hypertension: Secondary | ICD-10-CM | POA: Diagnosis not present

## 2018-05-27 DIAGNOSIS — M1991 Primary osteoarthritis, unspecified site: Secondary | ICD-10-CM | POA: Diagnosis not present

## 2018-05-27 DIAGNOSIS — R627 Adult failure to thrive: Secondary | ICD-10-CM | POA: Diagnosis not present

## 2018-05-27 DIAGNOSIS — G4709 Other insomnia: Secondary | ICD-10-CM | POA: Diagnosis not present

## 2018-05-27 DIAGNOSIS — Z794 Long term (current) use of insulin: Secondary | ICD-10-CM | POA: Diagnosis not present

## 2018-05-27 DIAGNOSIS — K5900 Constipation, unspecified: Secondary | ICD-10-CM | POA: Diagnosis not present

## 2018-05-27 DIAGNOSIS — G894 Chronic pain syndrome: Secondary | ICD-10-CM | POA: Diagnosis not present

## 2018-05-27 DIAGNOSIS — R600 Localized edema: Secondary | ICD-10-CM | POA: Diagnosis not present

## 2018-05-27 NOTE — Patient Outreach (Signed)
[facility]) Care Management  05/27/2018  [name] May 29, 1941 [id number]   Follow up call to Ms. Pozzi to ensure receipt of Advanced Directive packet and EMMI.  Ms. Muchmore said that she has not received so BSW is mailing it again today. Will follow up again next week to ensure receipt.    Ronn Melena, BSW Social Worker [phone]

## 2018-06-04 ENCOUNTER — Other Ambulatory Visit: Payer: Self-pay

## 2018-06-04 NOTE — Patient Outreach (Signed)
[facility]) Care Management  06/04/2018  [name] Apr 04, 1941 [id number]   Follow up call to [name] to ensure receipt of Advanced Directives packet and EMMI. She confirmed receipt and denied having questions.  [name] did report that she is hesitant to complete the documentation.  BSW talked with her about the benefits of having Advanced Directives in place. BSW is closing case at this time but encouraged [name] to call if she would like further assitance.   Ronn Melena, BSW Social Worker [phone]

## 2018-06-09 DIAGNOSIS — I2510 Atherosclerotic heart disease of native coronary artery without angina pectoris: Secondary | ICD-10-CM | POA: Diagnosis not present

## 2018-06-09 DIAGNOSIS — G4701 Insomnia due to medical condition: Secondary | ICD-10-CM | POA: Diagnosis not present

## 2018-06-09 DIAGNOSIS — Z79899 Other long term (current) drug therapy: Secondary | ICD-10-CM | POA: Diagnosis not present

## 2018-06-09 DIAGNOSIS — Z1389 Encounter for screening for other disorder: Secondary | ICD-10-CM | POA: Diagnosis not present

## 2018-06-09 DIAGNOSIS — G894 Chronic pain syndrome: Secondary | ICD-10-CM | POA: Diagnosis not present

## 2018-06-09 DIAGNOSIS — R569 Unspecified convulsions: Secondary | ICD-10-CM | POA: Diagnosis not present

## 2018-06-09 DIAGNOSIS — G909 Disorder of the autonomic nervous system, unspecified: Secondary | ICD-10-CM | POA: Diagnosis not present

## 2018-06-09 DIAGNOSIS — Z6824 Body mass index (BMI) 24.0-24.9, adult: Secondary | ICD-10-CM | POA: Diagnosis not present

## 2018-06-09 DIAGNOSIS — I351 Nonrheumatic aortic (valve) insufficiency: Secondary | ICD-10-CM | POA: Diagnosis not present

## 2018-06-11 DIAGNOSIS — Z7409 Other reduced mobility: Secondary | ICD-10-CM | POA: Diagnosis not present

## 2018-06-11 DIAGNOSIS — I69364 Other paralytic syndrome following cerebral infarction affecting left non-dominant side: Secondary | ICD-10-CM | POA: Diagnosis not present

## 2018-06-24 ENCOUNTER — Encounter: Payer: Self-pay | Admitting: Vascular Surgery

## 2018-07-06 ENCOUNTER — Encounter (HOSPITAL_COMMUNITY): Payer: Self-pay | Admitting: Emergency Medicine

## 2018-07-06 ENCOUNTER — Other Ambulatory Visit (HOSPITAL_COMMUNITY): Payer: Self-pay

## 2018-07-06 ENCOUNTER — Other Ambulatory Visit: Payer: Self-pay

## 2018-07-06 ENCOUNTER — Emergency Department (HOSPITAL_COMMUNITY): Payer: Medicare Other

## 2018-07-06 ENCOUNTER — Inpatient Hospital Stay (HOSPITAL_COMMUNITY)
Admission: EM | Admit: 2018-07-06 | Discharge: 2018-07-09 | DRG: 062 | Disposition: A | Discharge status: Rehabilitation Facility | Payer: Medicare Other | Attending: Neurology | Admitting: Neurology

## 2018-07-06 DIAGNOSIS — I2510 Atherosclerotic heart disease of native coronary artery without angina pectoris: Secondary | ICD-10-CM | POA: Diagnosis not present

## 2018-07-06 DIAGNOSIS — R4781 Slurred speech: Secondary | ICD-10-CM | POA: Diagnosis not present

## 2018-07-06 DIAGNOSIS — I69354 Hemiplegia and hemiparesis following cerebral infarction affecting left non-dominant side: Secondary | ICD-10-CM | POA: Diagnosis not present

## 2018-07-06 DIAGNOSIS — F419 Anxiety disorder, unspecified: Secondary | ICD-10-CM | POA: Diagnosis present

## 2018-07-06 DIAGNOSIS — G40909 Epilepsy, unspecified, not intractable, without status epilepticus: Secondary | ICD-10-CM | POA: Diagnosis present

## 2018-07-06 DIAGNOSIS — R471 Dysarthria and anarthria: Secondary | ICD-10-CM | POA: Diagnosis present

## 2018-07-06 DIAGNOSIS — K7460 Unspecified cirrhosis of liver: Secondary | ICD-10-CM | POA: Diagnosis present

## 2018-07-06 DIAGNOSIS — I471 Supraventricular tachycardia: Secondary | ICD-10-CM | POA: Diagnosis not present

## 2018-07-06 DIAGNOSIS — K219 Gastro-esophageal reflux disease without esophagitis: Secondary | ICD-10-CM | POA: Diagnosis not present

## 2018-07-06 DIAGNOSIS — R29818 Other symptoms and signs involving the nervous system: Secondary | ICD-10-CM | POA: Diagnosis not present

## 2018-07-06 DIAGNOSIS — I63511 Cerebral infarction due to unspecified occlusion or stenosis of right middle cerebral artery: Secondary | ICD-10-CM | POA: Diagnosis not present

## 2018-07-06 DIAGNOSIS — F17200 Nicotine dependence, unspecified, uncomplicated: Secondary | ICD-10-CM | POA: Diagnosis present

## 2018-07-06 DIAGNOSIS — K5900 Constipation, unspecified: Secondary | ICD-10-CM | POA: Diagnosis not present

## 2018-07-06 DIAGNOSIS — Z955 Presence of coronary angioplasty implant and graft: Secondary | ICD-10-CM | POA: Diagnosis not present

## 2018-07-06 DIAGNOSIS — Z91048 Other nonmedicinal substance allergy status: Secondary | ICD-10-CM | POA: Diagnosis not present

## 2018-07-06 DIAGNOSIS — Z7902 Long term (current) use of antithrombotics/antiplatelets: Secondary | ICD-10-CM

## 2018-07-06 DIAGNOSIS — G4733 Obstructive sleep apnea (adult) (pediatric): Secondary | ICD-10-CM | POA: Diagnosis present

## 2018-07-06 DIAGNOSIS — Z79899 Other long term (current) drug therapy: Secondary | ICD-10-CM | POA: Diagnosis not present

## 2018-07-06 DIAGNOSIS — E7800 Pure hypercholesterolemia, unspecified: Secondary | ICD-10-CM | POA: Diagnosis present

## 2018-07-06 DIAGNOSIS — Z7982 Long term (current) use of aspirin: Secondary | ICD-10-CM

## 2018-07-06 DIAGNOSIS — G8114 Spastic hemiplegia affecting left nondominant side: Secondary | ICD-10-CM | POA: Diagnosis not present

## 2018-07-06 DIAGNOSIS — G8194 Hemiplegia, unspecified affecting left nondominant side: Secondary | ICD-10-CM | POA: Diagnosis present

## 2018-07-06 DIAGNOSIS — Z9842 Cataract extraction status, left eye: Secondary | ICD-10-CM | POA: Diagnosis not present

## 2018-07-06 DIAGNOSIS — R1310 Dysphagia, unspecified: Secondary | ICD-10-CM

## 2018-07-06 DIAGNOSIS — R569 Unspecified convulsions: Secondary | ICD-10-CM | POA: Diagnosis not present

## 2018-07-06 DIAGNOSIS — I351 Nonrheumatic aortic (valve) insufficiency: Secondary | ICD-10-CM | POA: Diagnosis present

## 2018-07-06 DIAGNOSIS — R42 Dizziness and giddiness: Secondary | ICD-10-CM | POA: Diagnosis not present

## 2018-07-06 DIAGNOSIS — I639 Cerebral infarction, unspecified: Secondary | ICD-10-CM | POA: Diagnosis present

## 2018-07-06 DIAGNOSIS — R29707 NIHSS score 7: Secondary | ICD-10-CM | POA: Diagnosis not present

## 2018-07-06 DIAGNOSIS — R0682 Tachypnea, not elsewhere classified: Secondary | ICD-10-CM | POA: Diagnosis not present

## 2018-07-06 DIAGNOSIS — Z8673 Personal history of transient ischemic attack (TIA), and cerebral infarction without residual deficits: Secondary | ICD-10-CM | POA: Diagnosis not present

## 2018-07-06 DIAGNOSIS — Z23 Encounter for immunization: Secondary | ICD-10-CM | POA: Diagnosis not present

## 2018-07-06 DIAGNOSIS — R531 Weakness: Secondary | ICD-10-CM | POA: Diagnosis not present

## 2018-07-06 DIAGNOSIS — I10 Essential (primary) hypertension: Secondary | ICD-10-CM | POA: Diagnosis not present

## 2018-07-06 DIAGNOSIS — E119 Type 2 diabetes mellitus without complications: Secondary | ICD-10-CM | POA: Diagnosis not present

## 2018-07-06 DIAGNOSIS — Z8249 Family history of ischemic heart disease and other diseases of the circulatory system: Secondary | ICD-10-CM | POA: Diagnosis not present

## 2018-07-06 DIAGNOSIS — Z833 Family history of diabetes mellitus: Secondary | ICD-10-CM | POA: Diagnosis not present

## 2018-07-06 DIAGNOSIS — I6523 Occlusion and stenosis of bilateral carotid arteries: Secondary | ICD-10-CM | POA: Diagnosis not present

## 2018-07-06 DIAGNOSIS — Z87440 Personal history of urinary (tract) infections: Secondary | ICD-10-CM | POA: Diagnosis not present

## 2018-07-06 DIAGNOSIS — I69391 Dysphagia following cerebral infarction: Secondary | ICD-10-CM

## 2018-07-06 DIAGNOSIS — F17210 Nicotine dependence, cigarettes, uncomplicated: Secondary | ICD-10-CM | POA: Diagnosis present

## 2018-07-06 DIAGNOSIS — R29700 NIHSS score 0: Secondary | ICD-10-CM | POA: Diagnosis not present

## 2018-07-06 DIAGNOSIS — I6300 Cerebral infarction due to thrombosis of unspecified precerebral artery: Secondary | ICD-10-CM | POA: Diagnosis not present

## 2018-07-06 DIAGNOSIS — E785 Hyperlipidemia, unspecified: Secondary | ICD-10-CM | POA: Diagnosis not present

## 2018-07-06 DIAGNOSIS — I69322 Dysarthria following cerebral infarction: Secondary | ICD-10-CM | POA: Diagnosis not present

## 2018-07-06 DIAGNOSIS — I739 Peripheral vascular disease, unspecified: Secondary | ICD-10-CM | POA: Diagnosis not present

## 2018-07-06 LAB — APTT: aPTT: 27 seconds (ref 24–36)

## 2018-07-06 LAB — CBC
HEMATOCRIT: 32.8 % — AB (ref 36.0–46.0)
HEMOGLOBIN: 10.8 g/dL — AB (ref 12.0–15.0)
MCH: 31.8 pg (ref 26.0–34.0)
MCHC: 32.9 g/dL (ref 30.0–36.0)
MCV: 96.5 fL (ref 80.0–100.0)
NRBC: 0 % (ref 0.0–0.2)
Platelets: 135 10*3/uL — ABNORMAL LOW (ref 150–400)
RBC: 3.4 MIL/uL — ABNORMAL LOW (ref 3.87–5.11)
RDW: 14.1 % (ref 11.5–15.5)
WBC: 6.4 10*3/uL (ref 4.0–10.5)

## 2018-07-06 LAB — COMPREHENSIVE METABOLIC PANEL
ALT: 31 U/L (ref 0–44)
AST: 40 U/L (ref 15–41)
Albumin: 3.3 g/dL — ABNORMAL LOW (ref 3.5–5.0)
Alkaline Phosphatase: 94 U/L (ref 38–126)
Anion gap: 6 (ref 5–15)
BUN: 13 mg/dL (ref 8–23)
CO2: 27 mmol/L (ref 22–32)
Calcium: 9 mg/dL (ref 8.9–10.3)
Chloride: 107 mmol/L (ref 98–111)
Creatinine, Ser: 1.04 mg/dL — ABNORMAL HIGH (ref 0.44–1.00)
GFR calc Af Amer: 59 mL/min — ABNORMAL LOW (ref >60)
GFR calc non Af Amer: 51 mL/min — ABNORMAL LOW (ref >60)
Glucose, Bld: 192 mg/dL — ABNORMAL HIGH (ref 70–99)
Potassium: 3.3 mmol/L — ABNORMAL LOW (ref 3.5–5.1)
Sodium: 140 mmol/L (ref 135–145)
Total Bilirubin: 1 mg/dL (ref 0.3–1.2)
Total Protein: 5.5 g/dL — ABNORMAL LOW (ref 6.5–8.1)

## 2018-07-06 LAB — I-STAT TROPONIN, ED: Troponin i, poc: 0.14 ng/mL (ref 0.00–0.08)

## 2018-07-06 LAB — I-STAT CHEM 8, ED
BUN: 11 mg/dL (ref 8–23)
CALCIUM ION: 1.22 mmol/L (ref 1.15–1.40)
CHLORIDE: 103 mmol/L (ref 98–111)
Creatinine, Ser: 1.1 mg/dL — ABNORMAL HIGH (ref 0.44–1.00)
GLUCOSE: 188 mg/dL — AB (ref 70–99)
HCT: 30 % — ABNORMAL LOW (ref 36.0–46.0)
Hemoglobin: 10.2 g/dL — ABNORMAL LOW (ref 12.0–15.0)
Potassium: 3.4 mmol/L — ABNORMAL LOW (ref 3.5–5.1)
SODIUM: 141 mmol/L (ref 135–145)
TCO2: 26 mmol/L (ref 22–32)

## 2018-07-06 LAB — DIFFERENTIAL
Abs Immature Granulocytes: 0.01 10*3/uL (ref 0.00–0.07)
BASOS ABS: 0 10*3/uL (ref 0.0–0.1)
Basophils Relative: 0 %
Eosinophils Absolute: 0.2 10*3/uL (ref 0.0–0.5)
Eosinophils Relative: 3 %
IMMATURE GRANULOCYTES: 0 %
LYMPHS ABS: 2.2 10*3/uL (ref 0.7–4.0)
LYMPHS PCT: 35 %
Monocytes Absolute: 0.5 10*3/uL (ref 0.1–1.0)
Monocytes Relative: 8 %
NEUTROS ABS: 3.4 10*3/uL (ref 1.7–7.7)
Neutrophils Relative %: 54 %

## 2018-07-06 LAB — POC OCCULT BLOOD, ED: Fecal Occult Bld: NEGATIVE

## 2018-07-06 LAB — MRSA PCR SCREENING: MRSA BY PCR: NEGATIVE

## 2018-07-06 LAB — PROTIME-INR
INR: 1.2
Prothrombin Time: 15.1 seconds (ref 11.4–15.2)

## 2018-07-06 LAB — ETHANOL: Alcohol, Ethyl (B): <10 mg/dL (ref <10)

## 2018-07-06 LAB — CBG MONITORING, ED: Glucose-Capillary: 181 mg/dL — ABNORMAL HIGH (ref 70–99)

## 2018-07-06 MED ORDER — RESOURCE THICKENUP CLEAR PO POWD
ORAL | Status: DC | PRN
  Filled 2018-07-06: qty 1500

## 2018-07-06 MED ORDER — NICARDIPINE HCL IN NACL 20-0.86 MG/200ML-% IV SOLN
3.0000 mg/h | INTRAVENOUS | Status: DC | PRN
  Filled 2018-07-06: qty 2000

## 2018-07-06 MED ORDER — IOPAMIDOL (ISOVUE-370) INJECTION 76%
150.0000 mL | Freq: Once | INTRAVENOUS | Status: AC | PRN
  Administered 2018-07-06: 215 mL via INTRAVENOUS

## 2018-07-06 MED ORDER — SODIUM CHLORIDE 0.9 % IV SOLN
50.0000 mL | Freq: Once | INTRAVENOUS | Status: AC
  Administered 2018-07-06: 50 mL via INTRAVENOUS

## 2018-07-06 MED ORDER — POTASSIUM CHLORIDE 10 MEQ/100ML IV SOLN
10.0000 meq | Freq: Once | INTRAVENOUS | Status: AC
  Administered 2018-07-06: 10 meq via INTRAVENOUS
  Filled 2018-07-06: qty 100

## 2018-07-06 MED ORDER — INFLUENZA VAC SPLIT HIGH-DOSE 0.5 ML IM SUSY: 0.5000 mL | PREFILLED_SYRINGE | INTRAMUSCULAR | Status: DC | PRN

## 2018-07-06 MED ORDER — LORAZEPAM 1 MG PO TABS: 1.0000 mg | ORAL_TABLET | Freq: Every day | ORAL | Status: DC | PRN

## 2018-07-06 MED ORDER — STROKE: EARLY STAGES OF RECOVERY BOOK
Freq: Once | Status: AC
  Administered 2018-07-06: 17:00:00
  Filled 2018-07-06 (×2): qty 1

## 2018-07-06 MED ORDER — SODIUM CHLORIDE 0.9 % IV BOLUS: 1000.0000 mL | Freq: Once | INTRAVENOUS | Status: DC

## 2018-07-06 MED ORDER — ATORVASTATIN CALCIUM 80 MG PO TABS
80.0000 mg | ORAL_TABLET | Freq: Every day | ORAL | Status: DC
  Administered 2018-07-07 – 2018-07-09 (×3): 80 mg via ORAL
  Filled 2018-07-06: qty 1
  Filled 2018-07-06: qty 4
  Filled 2018-07-06: qty 1
  Filled 2018-07-06: qty 4

## 2018-07-06 MED ORDER — SODIUM CHLORIDE 0.9 % IV BOLUS
1000.0000 mL | Freq: Once | INTRAVENOUS | Status: AC
  Administered 2018-07-06: 1000 mL via INTRAVENOUS

## 2018-07-06 MED ORDER — LABETALOL HCL 5 MG/ML IV SOLN: 20.0000 mg | Freq: Once | INTRAVENOUS | Status: DC

## 2018-07-06 MED ORDER — LABETALOL HCL 5 MG/ML IV SOLN
10.0000 mg | Freq: Once | INTRAVENOUS | Status: AC
  Administered 2018-07-06: 10 mg via INTRAVENOUS
  Filled 2018-07-06: qty 4

## 2018-07-06 MED ORDER — ALTEPLASE (STROKE) FULL DOSE INFUSION
0.9000 mg/kg | Freq: Once | INTRAVENOUS | Status: AC
  Administered 2018-07-06: 56 mg via INTRAVENOUS

## 2018-07-06 MED ORDER — SODIUM CHLORIDE 0.9 % IV SOLN
INTRAVENOUS | Status: DC
  Administered 2018-07-06 – 2018-07-07 (×2): via INTRAVENOUS

## 2018-07-06 MED ORDER — LEVETIRACETAM 500 MG PO TABS: 500.0000 mg | ORAL_TABLET | Freq: Two times a day (BID) | ORAL | Status: DC

## 2018-07-06 MED ORDER — ALTEPLASE 100 MG IV SOLR
INTRAVENOUS | Status: AC
  Filled 2018-07-06: qty 100

## 2018-07-06 MED ORDER — CLEVIDIPINE BUTYRATE 0.5 MG/ML IV EMUL: 0.0000 mg/h | INTRAVENOUS | Status: DC

## 2018-07-06 MED ORDER — OXCARBAZEPINE 300 MG PO TABS
300.0000 mg | ORAL_TABLET | Freq: Two times a day (BID) | ORAL | Status: DC
  Administered 2018-07-06 – 2018-07-09 (×6): 300 mg via ORAL
  Filled 2018-07-06 (×8): qty 1

## 2018-07-06 MED ORDER — CIPROFLOXACIN HCL 500 MG PO TABS
500.0000 mg | ORAL_TABLET | Freq: Two times a day (BID) | ORAL | Status: AC
  Administered 2018-07-06 – 2018-07-07 (×3): 500 mg via ORAL
  Filled 2018-07-06 (×4): qty 1

## 2018-07-06 MED ORDER — SENNOSIDES-DOCUSATE SODIUM 8.6-50 MG PO TABS: 1.0000 | ORAL_TABLET | Freq: Every evening | ORAL | Status: DC | PRN

## 2018-07-06 NOTE — ED Notes (Signed)
Dr Roderic Palau at bedside.

## 2018-07-06 NOTE — ED Provider Notes (Signed)
[facility] EMERGENCY DEPARTMENT Provider Note   CSN: [id number] Arrival date & time: 07/06/18  1120   An emergency department physician performed an initial assessment on this suspected stroke patient at 1124.  History   Chief Complaint Chief Complaint  Patient presents with  . Code Stroke    HPI [name] is a 77 y.o. female.  Patient states that at 11 AM she started having slurred speech and left facial weakness with also left arm weakness.  The history is provided by the patient. No language interpreter was used.  Weakness  Primary symptoms include focal weakness. This is a new problem. The current episode started less than 1 hour ago. The problem has not changed since onset.Affected Side: Left face and left arm. There has been no fever (Fever). Pertinent negatives include no shortness of breath, no chest pain and no headaches. There were no medications administered prior to arrival. Associated medical issues do not include trauma.    Past Medical History:  Diagnosis Date  . Anemia   . Aortic insufficiency    Moderate  . Back pain   . Carotid stenosis, bilateral   . Cirrhosis ([place])   . Closed left femoral fracture ([place]) 04/21/2017  . Coronary artery disease    Stent x 2 RCA 1995, cardiac catheterization 09/2016 showing only mild atherosclerosis  . DDD (degenerative disc disease), lumbar   . Essential hypertension   . Falls   . GERD (gastroesophageal reflux disease)   . Hiatal hernia   . History of stroke   . Hypercholesteremia   . IBS (irritable bowel syndrome)   . Non-alcoholic fatty liver disease   . OSA (obstructive sleep apnea)    no CPAP  . Pericardial effusion    a. small by echo 2018.  Marland Kitchen PSVT (paroxysmal supraventricular tachycardia) ([place])   . Recurrent UTI   . Seizures ([place])   . Stroke ([place])   . Type 2 diabetes mellitus ([place])    off medication  . Varices, esophageal [facility])     Patient Active Problem List   Diagnosis Date Noted  . Dysarthria  05/19/2018  . Acute CVA (cerebrovascular accident) ([place]) 05/07/2018  . Syncope and collapse   . Syncope 08/24/2017  . Closed left femoral fracture ([place]) 04/21/2017  . Seizure disorder ([place]) 04/21/2017  . Closed pertrochanteric fracture of femur, left, initial encounter ([place]) 03/02/2017  . Seizure ([place]) 02/28/2017  . Hip fracture ([place]) 02/28/2017  . Chest pain 12/07/2016  . Atypical chest pain 12/07/2016  . Tachycardia   . NSTEMI (non-ST elevated myocardial infarction) ([place]) 11/02/2016  . Multifocal atrial tachycardia ([place]) 09/25/2016  . Elevated troponin 09/24/2016  . Hypokalemia 09/23/2016  . TIA (transient ischemic attack) 05/05/2016  . Intractable nausea and vomiting 10/15/2015  . Acute coronary syndrome ([place]) 06/01/2015  . Bronchitis 06/01/2015  . Thrombocytopenia ([place]) 06/01/2015  . Cerebral infarction ([place]) 03/06/2014  . Lower urinary tract infectious disease   . PSVT (paroxysmal supraventricular tachycardia) ([place]) 12/09/2013  . Esophageal varices ([place]) 03/29/2013  . Hematemesis 03/29/2013  . Hepatic cirrhosis ([place]) 03/29/2013  . Presence of stent in right coronary artery 07/04/2011  . OSA (obstructive sleep apnea) 07/04/2011  . Aortic sclerosis 07/04/2011  . Aortic insufficiency 07/04/2011  . HTN (hypertension) 07/04/2011  . Carotid stenosis, bilateral 07/04/2011  . Chest pain at rest 07/03/2011  . CAD (coronary artery disease) 07/03/2011  . DM type 2 (diabetes mellitus, type 2) ([place]) 07/03/2011  . Hypercholesteremia 07/03/2011  . GERD (gastroesophageal reflux disease) 07/03/2011  Past Surgical History:  Procedure Laterality Date  . [place]  . APPENDECTOMY    . BACK SURGERY  1985  . CARDIAC CATHETERIZATION  [phone]   Stent to the proximal RCA after MI   . CARDIAC CATHETERIZATION N/A 09/26/2016   Procedure: Left Heart Cath and Coronary Angiography;  Surgeon: Leonie Man, MD;  Location: [place] CV LAB;  Service: Cardiovascular;   Laterality: N/A;  . CATARACT EXTRACTION W/PHACO Right 07/04/2014   Procedure: CATARACT EXTRACTION PHACO AND INTRAOCULAR LENS PLACEMENT (IOC);  Surgeon: Elta Guadeloupe T. Gershon Crane, MD;  Location: [facility] ORS;  Service: Ophthalmology;  Laterality: Right;  CDE:13.13  . COLONOSCOPY    . DILATION AND CURETTAGE OF UTERUS     x2  . Epi Retinal Membrane Peel Left   . ERCP    . ESOPHAGEAL BANDING N/A 04/01/2013   Procedure: ESOPHAGEAL BANDING;  Surgeon: Rogene Houston, MD;  Location: [facility] ENDO SUITE;  Service: Endoscopy;  Laterality: N/A;  . ESOPHAGEAL BANDING N/A 05/24/2013   Procedure: ESOPHAGEAL BANDING;  Surgeon: Rogene Houston, MD;  Location: [facility];  Service: Endoscopy;  Laterality: N/A;  . ESOPHAGEAL BANDING N/A 06/21/2014   Procedure: ESOPHAGEAL BANDING;  Surgeon: Rogene Houston, MD;  [facility];  Service: Endoscopy;  Laterality: N/A;  . ESOPHAGOGASTRODUODENOSCOPY N/A 04/01/2013   Procedure: ESOPHAGOGASTRODUODENOSCOPY (EGD);  Surgeon: Rogene Houston, MD;  [facility];  Service: Endoscopy;  Laterality: N/A;  230-rescheduled to 8:30am Ann notified pt  . ESOPHAGOGASTRODUODENOSCOPY N/A 05/24/2013   Procedure: ESOPHAGOGASTRODUODENOSCOPY (EGD);  Surgeon: Rogene Houston, MD;  [facility];  Service: Endoscopy;  Laterality: N/A;  730  . ESOPHAGOGASTRODUODENOSCOPY N/A 06/21/2014   Procedure: ESOPHAGOGASTRODUODENOSCOPY (EGD);  Surgeon: Rogene Houston, MD;  [facility];  Service: Endoscopy;  Laterality: N/A;  930-rescheduled 10/14 @ 1200 Ann to notify pt  . EYE SURGERY  08   cataract surgery of the left eye  . FEMUR IM NAIL Left 03/02/2017   Procedure: INTRAMEDULLARY (IM) NAIL FEMORAL;  Surgeon: Rod Can, MD;  Location: [place];  Service: Orthopedics;  Laterality: Left;  . HARDWARE REMOVAL Right 01/17/2013   Procedure: REMOVAL OF HARDWARE AND EXCISION ULNAR STYLOID RIGHT WRIST;  Surgeon: Tennis Must, MD;  Location: [place];  Service:  Orthopedics;  Laterality: Right;  . MYRINGOTOMY  2012   both ears  . ORIF FEMUR FRACTURE Left 04/23/2017   Procedure: OPEN REDUCTION INTERNAL FIXATION (ORIF) DISTAL FEMUR FRACTURE (FRACTURE AROUND FEMORAL NAIL);  Surgeon: Altamese Kenton Vale, MD;  Location: [place];  Service: Orthopedics;  Laterality: Left;  . TONSILLECTOMY    . VAGINAL HYSTERECTOMY  1972  . WRIST SURGERY     rt wrist hardwear removal     OB History   None      Home Medications    Prior to Admission medications   Medication Sig Start Date End Date Taking? Authorizing Provider  aspirin EC 81 MG tablet Take 81 mg by mouth daily.    Jake Samples, PA-C  atorvastatin (LIPITOR) 80 MG tablet Take 1 tablet (80 mg total) by mouth daily. 05/08/18   Kathie Dike, MD  carvedilol (COREG) 12.5 MG tablet Take 1 tablet (12.5 mg total) by mouth 2 (two) times daily with a meal. Patient taking differently: Take 12.5 mg by mouth daily.  08/26/17   Orson Eva, MD  ciprofloxacin (CIPRO) 500 MG tablet Take 1 tablet by mouth 2 (two) times daily. 07/02/18  [provider]  clopidogrel (PLAVIX) 75 MG tablet Take 1 tablet (75 mg total) by mouth daily with breakfast. 05/09/18   Kathie Dike, MD  HYDROcodone-acetaminophen (NORCO) 10-325 MG tablet Take 1 tablet by mouth 4 (four) times daily as needed. 05/14/18   [provider]  levETIRAcetam (KEPPRA) 500 MG tablet Take 1 tablet (500 mg total) by mouth 2 (two) times daily. 05/08/18   Kathie Dike, MD  LORazepam (ATIVAN) 1 MG tablet Take 1 tablet by mouth daily as needed. 04/14/18   [provider]  Multiple Vitamins-Minerals (CENTRUM ADULTS) TABS Take 1 tablet by mouth daily.    [provider]  niacin (NIASPAN) 500 MG CR tablet Take 1 tablet by mouth daily. 06/14/18   [provider]  omeprazole (PRILOSEC) 20 MG capsule Take 1 capsule (20 mg total) by mouth daily. Patient not taking: Reported on 05/19/2018 03/05/18   Vaughan Basta, Rona Ravens, NP  ondansetron  (ZOFRAN) 4 MG tablet Take 1 tablet (4 mg total) by mouth every 8 (eight) hours as needed for nausea or vomiting. Patient not taking: Reported on 05/19/2018 01/26/18   Butch Penny, NP  ranitidine (ZANTAC) 150 MG tablet Take 1 tablet by mouth 2 (two) times daily as needed. 03/19/18   [provider]  tiZANidine (ZANAFLEX) 4 MG tablet Take 1 tablet by mouth 3 (three) times daily. 05/17/18   [provider]    Family History Family History  Problem Relation Age of Onset  . Diabetes Mother   . Heart failure Father   . Heart failure Maternal Aunt     Social History Social History   Tobacco Use  . Smoking status: Current Some Day Smoker    Packs/day: 0.25    Years: 50.00    Pack years: 12.50    Types: Cigarettes  . Smokeless tobacco: Never Used  . Tobacco comment: 1 pk lasts a week.   Substance Use Topics  . Alcohol use: No    Alcohol/week: 0.0 standard drinks  . Drug use: No     Allergies   Tape   Review of Systems Review of Systems  Constitutional: Negative for appetite change and fatigue.  HENT: Negative for congestion, ear discharge and sinus pressure.   Eyes: Negative for discharge.  Respiratory: Negative for cough and shortness of breath.   Cardiovascular: Negative for chest pain.  Gastrointestinal: Negative for abdominal pain and diarrhea.  Genitourinary: Negative for frequency and hematuria.  Musculoskeletal: Negative for back pain.  Skin: Negative for rash.  Neurological: Positive for focal weakness and weakness. Negative for seizures and headaches.       Slurred speech and left facial weakness  Psychiatric/Behavioral: Negative for hallucinations.     Physical Exam Updated Vital Signs BP (!) 131/48   Pulse 66   Resp 13   Wt 62.2 kg   SpO2 97%   BMI 24.29 kg/m   Physical Exam  Constitutional: She is oriented to person, place, and time. She appears well-developed.  HENT:  Head: Normocephalic.  Patient has drooping on the left side of  her face  Eyes: Conjunctivae and EOM are normal. No scleral icterus.  Neck: Neck supple. No thyromegaly present.  Cardiovascular: Normal rate and regular rhythm. Exam reveals no gallop and no friction rub.  No murmur heard. Pulmonary/Chest: No stridor. She has no wheezes. She has no rales. She exhibits no tenderness.  Abdominal: She exhibits no distension. There is no tenderness. There is no rebound.  Musculoskeletal: Normal range of motion. She  exhibits no edema.  Skin weakness to left arm with  Lymphadenopathy:    She has no cervical adenopathy.  Neurological: She is oriented to person, place, and time. She exhibits normal muscle tone. Coordination normal.  Skin: No rash noted. No erythema.  Psychiatric: She has a normal mood and affect. Her behavior is normal.     ED Treatments / Results  Labs (all labs ordered are listed, but only abnormal results are displayed) Labs Reviewed  CBC - Abnormal; Notable for the following components:      Result Value   RBC 3.40 (*)    Hemoglobin 10.8 (*)    HCT 32.8 (*)    Platelets 135 (*)    All other components within normal limits  COMPREHENSIVE METABOLIC PANEL - Abnormal; Notable for the following components:   Potassium 3.3 (*)    Glucose, Bld 192 (*)    Creatinine, Ser 1.04 (*)    Total Protein 5.5 (*)    Albumin 3.3 (*)    GFR calc non Af Amer 51 (*)    GFR calc Af Amer 59 (*)    All other components within normal limits  I-STAT CHEM 8, ED - Abnormal; Notable for the following components:   Potassium 3.4 (*)    Creatinine, Ser 1.10 (*)    Glucose, Bld 188 (*)    Hemoglobin 10.2 (*)    HCT 30.0 (*)    All other components within normal limits  I-STAT TROPONIN, ED - Abnormal; Notable for the following components:   Troponin i, poc 0.14 (*)    All other components within normal limits  CBG MONITORING, ED - Abnormal; Notable for the following components:   Glucose-Capillary 181 (*)    All other components within normal limits    ETHANOL  PROTIME-INR  APTT  DIFFERENTIAL  RAPID URINE DRUG SCREEN, HOSP PERFORMED  URINALYSIS, ROUTINE W REFLEX MICROSCOPIC  POC OCCULT BLOOD, ED    EKG None  Radiology Ct Head Code Stroke Wo Contrast  Result Date: 07/06/2018 CLINICAL DATA:  Code stroke. Acute onset of left-sided weakness today. Dizziness. EXAM: CT HEAD WITHOUT CONTRAST TECHNIQUE: Contiguous axial images were obtained from the base of the skull through the vertex without intravenous contrast. COMPARISON:  CT head without contrast 05/19/2018. MRI brain 05/19/2018. FINDINGS: Brain: Remote lacunar infarcts of the basal ganglia and internal capsule are stable. Remote right MCA territory encephalomalacia is stable. No acute infarct, hemorrhage, or mass lesion is present. The insular cortex is within normal limits bilaterally. No new cortical lesions are present. Remote lacunar infarcts are present in the cerebellum bilaterally. Brainstem is within normal limits. The ventricles are of normal size. No significant extraaxial fluid collection is present. Vascular: Dense atherosclerotic changes are present within the cavernous internal carotid arteries bilaterally and at the dural margin of both vertebral arteries. There is no significant hyperdense vessel. Skull: Calvarium is intact. No focal lytic or blastic lesions are present. Sinuses/Orbits: The paranasal sinuses and mastoid air cells are clear. Globes and orbits are within normal limits. ASPECTS [facility] Stroke Program Early CT Score) - Ganglionic level infarction (caudate, lentiform nuclei, internal capsule, insula, M1-M3 cortex): 7/7 - Supraganglionic infarction (M4-M6 cortex): 3/3 Total score (0-10 with 10 being normal): 10/10 IMPRESSION: 1. No acute intracranial abnormality. 2. Stable right MCA territory encephalomalacia without definite expansion of the infarct territory. 3. Remote lacunar infarcts of the basal ganglia and cerebellum bilaterally are stable. 4. Atherosclerosis. 5.  ASPECTS is 10/10 These results were called by telephone  at the time of interpretation on 07/06/2018 at 12:00 pm to Dr. Milton Ferguson , who verbally acknowledged these results. Electronically Signed   By: San Morelle M.D.   On: 07/06/2018 12:00    Procedures Procedures (including critical care time)  Medications Ordered in ED Medications  sodium chloride 0.9 % bolus 1,000 mL (1,000 mLs Intravenous New Bag/Given 07/06/18 1242)  potassium chloride 10 mEq in 100 mL IVPB (has no administration in time range)  alteplase (ACTIVASE) 1 mg/mL infusion 56 mg (56 mg Intravenous New Bag/Given 07/06/18 1239)    Followed by  0.9 %  sodium chloride infusion (has no administration in time range)  iopamidol (ISOVUE-370) 76 % injection 150 mL (has no administration in time range)  sodium chloride 0.9 % bolus 1,000 mL (0 mLs Intravenous Stopped 07/06/18 1235)     Initial Impression / Assessment and Plan / ED Course  I have reviewed the triage vital signs and the nursing notes.  Pertinent labs & imaging results that were available during my care of the patient were reviewed by me and considered in my medical decision making (see chart for details).     CRITICAL CARE Performed by: Milton Ferguson Total critical care time: 40 minutes Critical care time was exclusive of separately billable procedures and treating other patients. Critical care was necessary to treat or prevent imminent or life-threatening deterioration. Critical care was time spent personally by me on the following activities: development of treatment plan with patient and/or surrogate as well as nursing, discussions with consultants, evaluation of patient's response to treatment, examination of patient, obtaining history from patient or surrogate, ordering and performing treatments and interventions, ordering and review of laboratory studies, ordering and review of radiographic studies, pulse oximetry and re-evaluation of patient's  condition. Patient was seen by neurology and it was decided with the patient's help on the decision to give her TPA.  I spoke with Dr. Katherine Roan and neurology and the patient will be transferred over to [facility] for continued care.  Patient will need to go to the emergency department because there are no beds available.  Dr. Sherwood Gambler accepted the patient to the emergency department  Final Clinical Impressions(s) / ED Diagnoses   Final diagnoses:  None    ED Discharge Orders    None       Milton Ferguson, MD 07/06/18 1251

## 2018-07-06 NOTE — ED Notes (Signed)
Decision to administer TPA made at this time

## 2018-07-06 NOTE — ED Notes (Signed)
PT transported to CT angio at this time with nursing.

## 2018-07-06 NOTE — Consult Note (Addendum)
[facility] Tele[facility]urology Consult Services   Date of Service:   07/06/2018 11:46:53  Impression:     .  RO Acute Ischemic Stroke     .  Right Hemispheric     .  left sided weakness- ? R MCA stroke. CT head shows chronic R MCA stroke and therefore, exacerbation of old stroke symptoms also on differential  Comments: Differential Diagnosis: 1. Cardioembolic stroke 2. Small vessel disease/lacune 3. Thromboembolic, artery-to-artery mechanism 4. Hypercoagulable state-related infarct 5. Transient ischemic attack 6. Thrombotic mechanism, large artery disease 7. exacerbation of old stroke symptoms  Metrics: Last Known Well: 07/06/2018 11:00:00 [employer] Notification Time: 07/06/2018 11:45:14 Arrival Time: 07/06/2018 11:20:00 Stamp Time: 07/06/2018 11:46:53 Time First Login Attempt: 07/06/2018 11:54:37 Video Start Time: 07/06/2018 11:54:37  Symptoms: left facial, slurred speech, and dizziness. NIHSS Start Assessment Time: 07/06/2018 11:59:55 tPA Verbal Order Time: 07/06/2018 12:19:01 Patient is a candidate for tPA. tPA CPOE Order Time: 07/06/2018 12:29:01 Needle Time: 07/06/2018 12:39:13 Weight Noted by Staff: 62.2 kg Video End Time: 07/06/2018 12:44:07 Reason for tPA Delay: Delays related to tPA Administration tPA Delay Notes: difficulty getting an accurate weight- needed to find a bed scale that worked. Then we had to wait for tpa  CT head showed no acute hemorrhage or acute core infarct. CT head was reviewed.  Advanced imaging CTA head and neck obtained. Advanced imaging CTP obtained. Advanced imaging reviewed.  No LVO.  Therefore, thrombectomy not recommended  ER physician notified of the decision on thrombolytics management.  Verbal Consent to tPA: I have explained to the Patient the nature of the patient's condition, the use of tPA fibrinolytic agent, and the benefits to be reasonably expected compared with alternative approaches. I have discussed the  likelihood of major risks or complications of this procedure including (if applicable) but not limited to loss of limb function, brain damage, paralysis, hemorrhage, infection, complications from transfusion of blood components, drug reactions, blood clots and loss of life. I have also indicated that with any procedure there is always the possibility of an unexpected complication. All questions were answered and Patient express understanding of the treatment plan and consent to the treatment.  Our recommendations are outlined below.  Recommendations: IV tPA recommended.  tPA bolus given Without Complication.   IV tPA Total Dose - 56.0 mg IV tPA Bolus Dose - 5.6 mg IV tPA Infusion Dose - 50.4 mg  Routine post tPA monitoring including neuro checks and blood pressure control during/after treatment Monitor blood pressure Check blood pressure and NIHSS every 15 min for 2 h, then every 30 min for 6 h, and finally every hour for 16 h.  Manage Blood Pressure per post tPA protocol.      .  Admission to ICU     .  CT brain 24 hours post tPA     .  NPO until swallowing screen performed and passed     .  No antiplatelet agents or anticoagulants (including heparin for DVT prophylaxis) in first 24 hours     .  No Foley catheter, nasogastric tube, arterial catheter or central venous catheter for 24 hr, unless absolutely necessary     .  Telemetry     .  Bedside swallow evaluation     .  HOB less than 30 degrees     .  Euglycemia     .  Avoid hyperthermia, PRN acetaminophen     .  DVT prophylaxis     .  Inpatient Neurology Consultation     .  Stroke evaluation as per inpatient neurology recommendations  Discussed with ED physician    ------------------------------------------------------------------------------  History of Present Illness: Patient is a 77 years old Female.  Patient was brought by private transportation with symptoms of left facial, slurred speech, and dizziness.  77 year  old woman presents with history of stroke with residual right leg weakness, ETOH, cirrhosis, varices (no recent bleed in last 3 weeks), tibial fracture 1 year ago (with residual LLE weakness x 1 year) presents with left facial, slurred speech, and dizziness. Symptoms began acutely at 11 AM. Glucose 192  CT head showed no acute hemorrhage or acute core infarct. CT head was reviewed.  Last seen normal was beyond 4.5 hours of presentation. There is no history of hemorrhagic complications or intracranial hemorrhage. There is no history of Recent Anticoagulants. There is no history of recent major surgery. There is no history of recent stroke.  Examination: BP(106/46), 1A: Level of Consciousness - Alert; keenly responsive + 0 1B: Ask Month and Age - Both Questions Right + 0 1C: Blink Eyes & Squeeze Hands - Performs Both Tasks + 0 2: Test Horizontal Extraocular Movements - Normal + 0 3: Test Visual Fields - No Visual Loss + 0 4: Test Facial Palsy (Use Grimace if Obtunded) - Partial paralysis (lower face) + 2 5A: Test Left Arm Motor Drift - Drift, but doesn't hit bed + 1 5B: Test Right Arm Motor Drift - No Drift for 10 Seconds + 0 6A: Test Left Leg Motor Drift - Drift, but doesn't hit bed + 1 (due to chronic symptoms per patient) 6B: Test Right Leg Motor Drift - Drift, but doesn't hit bed + 1  (due to chronic symptoms per patient) 7: Test Limb Ataxia (FNF/Heel-Shin) - No Ataxia + 0 8: Test Sensation - Mild-Moderate Loss: Less Sharp/More Dull + 1 9: Test Language/Aphasia - Normal; No aphasia + 0 10: Test Dysarthria - Mild-Moderate Dysarthria: Slurring but can be understood + 1 11: Test Extinction/Inattention - No abnormality + 0  NIHSS Score: 7  Patient was informed the Neurology Consult would happen via TeleHealth consult by way of interactive audio and video telecommunications and consented to receiving care in this manner.  Due to the immediate potential for life-threatening deterioration  due to underlying acute neurologic illness, I spent 35 minutes providing critical care. This time includes time for face to face visit via telemedicine, review of medical records, imaging studies and discussion of findings with providers, the patient and/or family.   Dr Uvaldo Bristle   [facility] [phone]

## 2018-07-06 NOTE — Progress Notes (Addendum)
Pt arrive to 4N at 1600.  Pt VSS.  RN will continue to monitor.

## 2018-07-06 NOTE — ED Provider Notes (Addendum)
  Physical Exam  BP (!) 124/47   Pulse 68   Resp 13   Wt 62.2 kg   SpO2 94%   BMI 24.29 kg/m   Physical Exam  ED Course/Procedures     Procedures  MDM  Transfer from [facility] after TPA.  Patient states that she feels her deficits are improving.  Speech is less slurred.  Some increasing strength on the right side.  Admit t neurologist.       Davonna Belling, MD 07/06/18 1421    Davonna Belling, MD 07/06/18 [phone]

## 2018-07-06 NOTE — ED Notes (Signed)
Called [facility] to transport PT to [facility] ER.

## 2018-07-06 NOTE — ED Notes (Signed)
CRITICAL VALUE ALERT  Critical Value:  Trop 0.14  Date & Time Notied:  07/06/18, 1144  Provider Notified: Dr. Roderic Palau  Orders Received/Actions taken: no new orders at this time

## 2018-07-06 NOTE — H&P (Addendum)
Neurology H&P   CC: Code stroke  History is obtained from: Daughter  HPI: [name] is a 77 y.o. female with history of type 2 diabetes, stroke, seizures, hypercholesterolemia, coronary artery disease and carotid stenosis bilaterally.  Per daughter patient was doing well today when her granddaughter noted a sudden onset of left facial droop, dysarthria, weakness in the left side.  She called her mother and explained what was going on.  [facility] where patient was evaluated by tele-neurology.  Patient was well within the window for TPA.  Patient had no contraindications and administer TPA.  Patient was then transferred to [facility].  Upon consultation per daughter she had improved significantly.  She no longer dysarthria, her left facial droop had improved, her left arm had become stronger however her left leg is still slightly weak.   LKW: 07/06/2018 at 11 AM tpa given?:  Yes Premorbid modified Rankin scale (mRS): 0 NIH stroke scale while at [facility] 2  ROS: A 14 point ROS was performed and is negative except as noted in the HPI.   Past Medical History:  Diagnosis Date  . Anemia   . Aortic insufficiency    Moderate  . Back pain   . Carotid stenosis, bilateral   . Cirrhosis ([place])   . Closed left femoral fracture ([place]) 04/21/2017  . Coronary artery disease    Stent x 2 RCA 1995, cardiac catheterization 09/2016 showing only mild atherosclerosis  . DDD (degenerative disc disease), lumbar   . Essential hypertension   . Falls   . GERD (gastroesophageal reflux disease)   . Hiatal hernia   . History of stroke   . Hypercholesteremia   . IBS (irritable bowel syndrome)   . Non-alcoholic fatty liver disease   . OSA (obstructive sleep apnea)    no CPAP  . Pericardial effusion    a. small by echo 2018.  Marland Kitchen PSVT (paroxysmal supraventricular tachycardia) ([place])   . Recurrent UTI   . Seizures ([place])   . Stroke ([place])   . Type 2 diabetes mellitus ([place])     off medication  . Varices, esophageal (HCC)     Family History  Problem Relation Age of Onset  . Diabetes Mother   . Heart failure Father   . Heart failure Maternal Aunt      Social History:   reports that she has been smoking cigarettes. She has a 12.50 pack-year smoking history. She has never used smokeless tobacco. She reports that she does not drink alcohol or use drugs.  Medications  Current Facility-Administered Medications:  .   stroke: mapping our early stages of recovery book, , Does not apply, Once, Marliss Coots, PA-C .  0.9 %  sodium chloride infusion, , Intravenous, Continuous, Marliss Coots, PA-C .  atorvastatin (LIPITOR) tablet 80 mg, 80 mg, Oral, Daily, Marliss Coots, PA-C .  labetalol (NORMODYNE,TRANDATE) injection 20 mg, 20 mg, Intravenous, Once **AND** clevidipine (CLEVIPREX) infusion 0.5 mg/mL, 0-21 mg/hr, Intravenous, Continuous, Marliss Coots, PA-C .  levETIRAcetam (KEPPRA) tablet 500 mg, 500 mg, Oral, BID, Marliss Coots, PA-C .  LORazepam (ATIVAN) tablet 1 mg, 1 mg, Oral, Daily PRN, Marliss Coots, PA-C .  senna-docusate (Senokot-S) tablet 1 tablet, 1 tablet, Oral, QHS PRN, Marliss Coots, PA-C  Current Outpatient Medications:  .  aspirin EC 81 MG tablet, Take 81 mg by mouth daily., Disp: , Rfl:  .  atorvastatin (LIPITOR) 80 MG tablet, Take 1  tablet (80 mg total) by mouth daily., Disp: 30 tablet, Rfl: 1 .  carvedilol (COREG) 12.5 MG tablet, Take 1 tablet (12.5 mg total) by mouth 2 (two) times daily with a meal. (Patient taking differently: Take 12.5 mg by mouth daily. ), Disp: 60 tablet, Rfl: 1 .  ciprofloxacin (CIPRO) 500 MG tablet, Take 1 tablet by mouth 2 (two) times daily., Disp: , Rfl:  .  clopidogrel (PLAVIX) 75 MG tablet, Take 1 tablet (75 mg total) by mouth daily with breakfast., Disp: 30 tablet, Rfl: 0 .  HYDROcodone-acetaminophen (NORCO) 10-325 MG tablet, Take 1 tablet by mouth 4 (four) times daily as needed., Disp: , Rfl:  .  levETIRAcetam  (KEPPRA) 500 MG tablet, Take 1 tablet (500 mg total) by mouth 2 (two) times daily., Disp: 60 tablet, Rfl: 0 .  LORazepam (ATIVAN) 1 MG tablet, Take 1 tablet by mouth daily as needed., Disp: , Rfl:  .  Multiple Vitamins-Minerals (CENTRUM ADULTS) TABS, Take 1 tablet by mouth daily., Disp: , Rfl:  .  niacin (NIASPAN) 500 MG CR tablet, Take 1 tablet by mouth daily., Disp: , Rfl:  .  omeprazole (PRILOSEC) 20 MG capsule, Take 1 capsule (20 mg total) by mouth daily. (Patient not taking: Reported on 05/19/2018), Disp: 90 capsule, Rfl: 3 .  ondansetron (ZOFRAN) 4 MG tablet, Take 1 tablet (4 mg total) by mouth every 8 (eight) hours as needed for nausea or vomiting. (Patient not taking: Reported on 05/19/2018), Disp: 30 tablet, Rfl: 1 .  ranitidine (ZANTAC) 150 MG tablet, Take 1 tablet by mouth 2 (two) times daily as needed., Disp: , Rfl:  .  tiZANidine (ZANAFLEX) 4 MG tablet, Take 1 tablet by mouth 3 (three) times daily., Disp: , Rfl:    Exam: Current vital signs: BP (!) 141/50   Pulse 67   Resp 12   Wt 62.2 kg   SpO2 100%   BMI 24.29 kg/m  Vital signs in last 24 hours: Pulse Rate:  [46-69] 67 (10/29 1445) Resp:  [11-19] 12 (10/29 1445) BP: (85-142)/(31-58) 141/50 (10/29 1445) SpO2:  [93 %-100 %] 100 % (10/29 1445) Weight:  [59 kg-62.2 kg] 62.2 kg (10/29 1227)  Physical Exam  Constitutional: Appears well-developed and well-nourished.  Psych: Affect appropriate to situation Eyes: No scleral injection HENT: No OP obstrucion Head: Normocephalic.  Cardiovascular: Normal rate and regular rhythm.  Respiratory: Effort normal, non-labored breathing GI: Soft.  No distension. There is no tenderness.  Skin: WDI  Neuro: Mental Status: Patient is awake, alert, oriented to person, place, month, year, and situation. Patient is able to give a clear and coherent history. No signs of aphasia or neglect Cranial Nerves: II: Visual Fields are full. Pupils are equal, round, and reactive to light.   III,IV,  VI: EOMI without ptosis or diploplia.  V: Facial sensation is symmetric to temperature VII: Left facial droop.  VIII: hearing is intact to voice X: Uvula elevates symmetrically XI: Shoulder shrug is symmetric. XII: tongue is midline without atrophy or fasciculations.  Motor: Tone is normal. Bulk is normal. 5/5 strength was present extremities with the exception of left arm which is 4+/5 and left leg which is 4/5.  Sensory: Sensation is symmetric to light touch and temperature in the arms and legs. Deep Tendon Reflexes: 2+ and symmetric in the biceps and patellae.  Plantars: Toes are downgoing bilaterally.  Cerebellar: FNF with no past pointing to which the daughter had stated was present initially and HKS are intact bilaterally  Labs I have  reviewed labs in epic and the results pertinent to this consultation are:   CBC    Component Value Date/Time   WBC 6.4 07/06/2018 1134   RBC 3.40 (L) 07/06/2018 1134   HGB 10.2 (L) 07/06/2018 1134   HGB 10.8 (L) 07/06/2018 1134   HCT 30.0 (L) 07/06/2018 1134   HCT 32.8 (L) 07/06/2018 1134   PLT 135 (L) 07/06/2018 1134   MCV 96.5 07/06/2018 1134   MCH 31.8 07/06/2018 1134   MCHC 32.9 07/06/2018 1134   RDW 14.1 07/06/2018 1134   LYMPHSABS 2.2 07/06/2018 1134   MONOABS 0.5 07/06/2018 1134   EOSABS 0.2 07/06/2018 1134   BASOSABS 0.0 07/06/2018 1134    CMP     Component Value Date/Time   NA 141 07/06/2018 1134   NA 140 07/06/2018 1134   K 3.4 (L) 07/06/2018 1134   K 3.3 (L) 07/06/2018 1134   CL 103 07/06/2018 1134   CL 107 07/06/2018 1134   CO2 27 07/06/2018 1134   GLUCOSE 188 (H) 07/06/2018 1134   GLUCOSE 192 (H) 07/06/2018 1134   BUN 11 07/06/2018 1134   BUN 13 07/06/2018 1134   CREATININE 1.10 (H) 07/06/2018 1134   CREATININE 1.04 (H) 07/06/2018 1134   CREATININE 0.78 01/12/2018 1310   CALCIUM 9.0 07/06/2018 1134   CALCIUM 8.4 (L) 04/22/2017 0514   PROT 5.5 (L) 07/06/2018 1134   PROT 6.3 03/24/2018 1134   ALBUMIN 3.3  (L) 07/06/2018 1134   ALBUMIN 4.1 03/24/2018 1134   AST 40 07/06/2018 1134   ALT 31 07/06/2018 1134   ALKPHOS 94 07/06/2018 1134   BILITOT 1.0 07/06/2018 1134   BILITOT 0.5 03/24/2018 1134   GFRNONAA 51 (L) 07/06/2018 1134   GFRNONAA 74 01/12/2018 1310   GFRAA 59 (L) 07/06/2018 1134   GFRAA 86 01/12/2018 1310    Lipid Panel     Component Value Date/Time   CHOL 144 05/08/2018 0637   CHOL 142 12/07/2013 1038   TRIG 69 05/08/2018 0637   TRIG 98 12/07/2013 1038   HDL 57 05/08/2018 0637   HDL 47 12/07/2013 1038   CHOLHDL 2.5 05/08/2018 0637   VLDL 14 05/08/2018 0637   LDLCALC 73 05/08/2018 0637   LDLCALC 83 09/04/2017 1023   LDLCALC 75 12/07/2013 1038     Imaging I have reviewed the images obtained:  CT-scan of the brain--no acute intracranial abnormality--lacunar infarcts of the basal ganglia and cerebellum bilaterally CTA of head and neck--stable right MCA territory encephalomalacia without definite mention of infarct territory MRI examination of the brain--pending  Etta Quill PA-C [facility] [phone]  M-F  (9:00 am- 5:00 PM)  07/06/2018, 3:06 PM    I have seen the patient and reviewed the above note.  She has a history of previous right-sided stroke and had other events that were slightly similar, thought to be possibly post ictal Todd's.  No seizure was witnessed today.  She has had significant improvement after the TPA.  She is not certain of the milligrams of Trileptal that she takes but takes it twice a day, according to the computer it is 300 mg.  Assessment:  77 year old female transferred to [facility] secondary to receiving tPA for presumed acute ischemic stroke.  She has had a marked improvement following the IV TPA.  Etiology is currently unclear.   Plan - HgbA1c, fasting lipid panel - MRI of the brain without contrast - Frequent neuro checks - Echocardiogram - Prophylactic therapy-none for 24 hours - Risk factor  modification -  Telemetry monitoring - PT consult, OT consult, Speech consult - Stroke team to follow -Continue Trileptal 300 mg twice daily(may need to call pharmacy to confirm dosage) -Continue Keppra 500 mg twice daily -If there are no areas of ischemia, may consider increasing Keppra to 750 mg twice daily  This patient is critically ill and at significant risk of neurological worsening, death and care requires constant monitoring of vital signs, hemodynamics,respiratory and cardiac monitoring, neurological assessment, discussion with family, other specialists and medical decision making of high complexity. I spent 35 minutes of neurocritical care time  in the care of  this patient.  Roland Rack, MD [facility] [phone]  If 7pm- 7am, please page neurology on call as listed in [place]. 07/06/2018  7:43 PM

## 2018-07-06 NOTE — ED Notes (Signed)
Per Dr Roderic Palau, patient has history of esophageal varices and may not be a candidate for thrombolytics.

## 2018-07-06 NOTE — Progress Notes (Signed)
CALL TIME - 11:24 Beeper time - 0479 Exam started 9872 Exam finishte - 1587 Images sent to PACs - 1137 Exam completed in [facility] called - [phone]

## 2018-07-06 NOTE — ED Notes (Signed)
[facility] at bedside 

## 2018-07-06 NOTE — Evaluation (Signed)
Clinical/Bedside Swallow Evaluation Patient Details  Name: [name] MRN: [id number] Date of Birth: 08/14/1941  Today's Date: 07/06/2018 Time: SLP Start Time (ACUTE ONLY): 45 SLP Stop Time (ACUTE ONLY): 1656 SLP Time Calculation (min) (ACUTE ONLY): 10 min  Past Medical History:  Past Medical History:  Diagnosis Date  . Anemia   . Aortic insufficiency    Moderate  . Back pain   . Carotid stenosis, bilateral   . Cirrhosis ([place])   . Closed left femoral fracture ([place]) 04/21/2017  . Coronary artery disease    Stent x 2 RCA 1995, cardiac catheterization 09/2016 showing only mild atherosclerosis  . DDD (degenerative disc disease), lumbar   . Essential hypertension   . Falls   . GERD (gastroesophageal reflux disease)   . Hiatal hernia   . History of stroke   . Hypercholesteremia   . IBS (irritable bowel syndrome)   . Non-alcoholic fatty liver disease   . OSA (obstructive sleep apnea)    no CPAP  . Pericardial effusion    a. small by echo 2018.  Marland Kitchen PSVT (paroxysmal supraventricular tachycardia) ([place])   . Recurrent UTI   . Seizures ([place])   . Stroke ([place])   . Type 2 diabetes mellitus ([place])    off medication  . Varices, esophageal (HCC)    Past Surgical History:  Past Surgical History:  Procedure Laterality Date  . [place]  . APPENDECTOMY    . BACK SURGERY  1985  . CARDIAC CATHETERIZATION  [phone]   Stent to the proximal RCA after MI   . CARDIAC CATHETERIZATION N/A 09/26/2016   Procedure: Left Heart Cath and Coronary Angiography;  Surgeon: Leonie Man, MD;  Location: [place] CV LAB;  Service: Cardiovascular;  Laterality: N/A;  . CATARACT EXTRACTION W/PHACO Right 07/04/2014   Procedure: CATARACT EXTRACTION PHACO AND INTRAOCULAR LENS PLACEMENT (IOC);  Surgeon: Elta Guadeloupe T. Gershon Crane, MD;  Location: [facility] OR[facility];  Service: Ophthalmology;  Laterality: Right;  CDE:13.13  . COLONOSCOPY    . DILATION AND CURETTAGE OF UTERUS     x2  . Epi Retinal Membrane  Peel Left   . ERCP    . ESOPHAGEAL BANDING N/A 04/01/2013   Procedure: ESOPHAGEAL BANDING;  Surgeon: Rogene Houston, MD;  Location[facility];  Service: Endoscopy;  Laterality: N/A;  . ESOPHAGEAL BANDING N/A 05/24/2013   Procedure: ESOPHAGEAL BANDING;  Surgeon: Rogene Houston, MD;  [facility];  Service: Endoscopy;  Laterality: N/A;  . ESOPHAGEAL BANDING N/A 06/21/2014   Procedure: ESOPHAGEAL BANDING;  Surgeon: Rogene Houston, MD;  [facility];  Service: Endoscopy;  Laterality: N/A;  . ESOPHAGOGASTRODUODENOSCOPY N/A 04/01/2013   Procedure: ESOPHAGOGASTRODUODENOSCOPY (EGD);  Surgeon: Rogene Houston, MD;  [facility];  Service: Endoscopy;  Laterality: N/A;  230-rescheduled to 8:30am Ann notified pt  . ESOPHAGOGASTRODUODENOSCOPY N/A 05/24/2013   Procedure: ESOPHAGOGASTRODUODENOSCOPY (EGD);  Surgeon: Rogene Houston, MD;  [facility];  Service: Endoscopy;  Laterality: N/A;  730  . ESOPHAGOGASTRODUODENOSCOPY N/A 06/21/2014   Procedure: ESOPHAGOGASTRODUODENOSCOPY (EGD);  Surgeon: Rogene Houston, MD;  [facility];  Service: Endoscopy;  Laterality: N/A;  930-rescheduled 10/14 @ 1200 Ann to notify pt  . EYE SURGERY  08   cataract surgery of the left eye  . FEMUR IM NAIL Left 03/02/2017   Procedure: INTRAMEDULLARY (IM) NAIL FEMORAL;  Surgeon: Rod Can, MD;  Location: [place];  Service: Orthopedics;  Laterality: Left;  . HARDWARE  REMOVAL Right 01/17/2013   Procedure: REMOVAL OF HARDWARE AND EXCISION ULNAR STYLOID RIGHT WRIST;  Surgeon: Tennis Must, MD;  Location: [place];  Service: Orthopedics;  Laterality: Right;  . MYRINGOTOMY  2012   both ears  . ORIF FEMUR FRACTURE Left 04/23/2017   Procedure: OPEN REDUCTION INTERNAL FIXATION (ORIF) DISTAL FEMUR FRACTURE (FRACTURE AROUND FEMORAL NAIL);  Surgeon: Altamese Pollard, MD;  Location: [place];  Service: Orthopedics;  Laterality: Left;  . TONSILLECTOMY    . VAGINAL  HYSTERECTOMY  1972  . WRIST SURGERY     rt wrist hardwear removal   HPI:   77 y.o. female with history of type 2 diabetes, stroke, seizures, hypercholesterolemia, coronary artery disease, GERD, carotid stenosis admitted with sudden onset dysarthria, left hemiparesis, left facial asymmetry.  tPA administered. CCT: no acute intracranial abnormality--lacunar infarcts of the basal ganglia and cerebellum bilaterally. Pt with recent admission August 2019.  Clinical swallow evaluation noted left facial asymmetry; no swallowing deficits.  Recommendations were for regular solids, thin liquids.    Assessment / Plan / Recommendation Clinical Impression  Pt presents with s/s of dysphagia with consistent coughing associated with consumption of thin liquids.  There are focal CN deficits left CN VII; tongue is midline; palate elevates symmetrically.  Purees, nectars, and solids were tolerated with adequate mastication, no s/s of aspiration.  Recommend initiating a dysphagia 3 diet with nectar thick liquids.  SLP will follow for diet tolerance/safety and to determine necessity of instrumental testing.   SLP Visit Diagnosis: Dysphagia, unspecified (R13.10)    Aspiration Risk       Diet Recommendation   dysphagia 3, nectar thick liquids  Medication Administration: Whole meds with liquid    Other  Recommendations Oral Care Recommendations: Oral care BID Other Recommendations: Order thickener from pharmacy   Follow up Recommendations Other (comment)(tba)      Frequency and Duration min 2x/week  1 week       Prognosis        Swallow Study   General Date of Onset: 07/06/18 HPI:  77 y.o. female with history of type 2 diabetes, stroke, seizures, hypercholesterolemia, coronary artery disease, GERD, carotid stenosis admitted with sudden onset dysarthria, left hemiparesis, left facial asymmetry.  tPA administered. CCT: no acute intracranial abnormality--lacunar infarcts of the basal ganglia and cerebellum  bilaterally. Pt with recent admission August 2019.  Clinical swallow evaluation noted left facial asymmetry; no swallowing deficits.  Recommendations were for regular solids, thin liquids.  Type of Study: Bedside Swallow Evaluation Previous Swallow Assessment: see HPI Diet Prior to this Study: NPO Temperature Spikes Noted: No Respiratory Status: Room air History of Recent Intubation: No Behavior/Cognition: Alert;Cooperative;Pleasant mood Oral Cavity Assessment: Within Functional Limits Oral Care Completed by SLP: No Oral Cavity - Dentition: Adequate natural dentition Vision: Functional for self-feeding Self-Feeding Abilities: Able to feed self Patient Positioning: Upright in bed Baseline Vocal Quality: Normal Volitional Cough: Strong Volitional Swallow: Able to elicit    Oral/Motor/Sensory Function Overall Oral Motor/Sensory Function: Mild impairment Facial ROM: Suspected CN VII (facial) dysfunction;Reduced left Facial Symmetry: Abnormal symmetry left;Suspected CN VII (facial) dysfunction Lingual Symmetry: Within Functional Limits   Ice Chips Ice chips: Within functional limits   Thin Liquid Thin Liquid: Impaired Presentation: Cup;Straw Pharyngeal  Phase Impairments: Cough - Immediate    Nectar Thick Nectar Thick Liquid: Within functional limits   Honey Thick Honey Thick Liquid: Not tested   Puree Puree: Within functional limits   Solid     Solid: Within functional  limits      [name] 07/06/2018,5:04 PM   Estill Bamberg L. Tivis Ringer, [place] Office number [phone] Pager [phone]

## 2018-07-06 NOTE — ED Notes (Signed)
Patient to CT.

## 2018-07-06 NOTE — ED Triage Notes (Signed)
Patient brought in by neighbor states she had dizziness and weakness 30 minutes ago. Patient states "I think I'm having a TIA. It started about 20 minutes ago." Last known normal is 1100 today. Patient has weakness on left side and left facial droop.

## 2018-07-06 NOTE — ED Notes (Signed)
Report received from [place].

## 2018-07-06 NOTE — ED Notes (Signed)
Neurology bedside for assessment and planning

## 2018-07-07 ENCOUNTER — Inpatient Hospital Stay (HOSPITAL_COMMUNITY): Payer: Medicare Other

## 2018-07-07 DIAGNOSIS — R0682 Tachypnea, not elsewhere classified: Secondary | ICD-10-CM | POA: Insufficient documentation

## 2018-07-07 DIAGNOSIS — I10 Essential (primary) hypertension: Secondary | ICD-10-CM | POA: Diagnosis present

## 2018-07-07 DIAGNOSIS — I69391 Dysphagia following cerebral infarction: Secondary | ICD-10-CM

## 2018-07-07 DIAGNOSIS — I2510 Atherosclerotic heart disease of native coronary artery without angina pectoris: Secondary | ICD-10-CM

## 2018-07-07 DIAGNOSIS — I351 Nonrheumatic aortic (valve) insufficiency: Secondary | ICD-10-CM

## 2018-07-07 DIAGNOSIS — I471 Supraventricular tachycardia: Secondary | ICD-10-CM

## 2018-07-07 DIAGNOSIS — E119 Type 2 diabetes mellitus without complications: Secondary | ICD-10-CM

## 2018-07-07 DIAGNOSIS — R1310 Dysphagia, unspecified: Secondary | ICD-10-CM

## 2018-07-07 DIAGNOSIS — R569 Unspecified convulsions: Secondary | ICD-10-CM

## 2018-07-07 DIAGNOSIS — I639 Cerebral infarction, unspecified: Secondary | ICD-10-CM

## 2018-07-07 LAB — ECHOCARDIOGRAM COMPLETE: Weight: 2194.0179 oz

## 2018-07-07 LAB — HEMOGLOBIN A1C
Hgb A1c MFr Bld: 4.6 % — ABNORMAL LOW (ref 4.8–5.6)
MEAN PLASMA GLUCOSE: 85.32 mg/dL

## 2018-07-07 LAB — LIPID PANEL
CHOLESTEROL: 109 mg/dL (ref 0–200)
HDL: 49 mg/dL (ref >40)
LDL Cholesterol: 50 mg/dL (ref 0–99)
TRIGLYCERIDES: 50 mg/dL (ref <150)
Total CHOL/HDL Ratio: 2.2 RATIO
VLDL: 10 mg/dL (ref 0–40)

## 2018-07-07 MED ORDER — ONDANSETRON HCL 4 MG/2ML IJ SOLN
4.0000 mg | Freq: Three times a day (TID) | INTRAMUSCULAR | Status: DC
  Administered 2018-07-07 – 2018-07-09 (×4): 4 mg via INTRAVENOUS
  Filled 2018-07-07 (×7): qty 2

## 2018-07-07 MED ORDER — POTASSIUM CHLORIDE CRYS ER 20 MEQ PO TBCR
20.0000 meq | EXTENDED_RELEASE_TABLET | Freq: Two times a day (BID) | ORAL | Status: AC
  Administered 2018-07-07 (×2): 20 meq via ORAL
  Filled 2018-07-07 (×2): qty 1

## 2018-07-07 MED ORDER — ASPIRIN EC 81 MG PO TBEC
81.0000 mg | DELAYED_RELEASE_TABLET | Freq: Every day | ORAL | Status: DC
  Administered 2018-07-07 – 2018-07-09 (×3): 81 mg via ORAL
  Filled 2018-07-07 (×3): qty 1

## 2018-07-07 MED ORDER — CLOPIDOGREL BISULFATE 75 MG PO TABS
75.0000 mg | ORAL_TABLET | Freq: Every day | ORAL | Status: DC
  Administered 2018-07-08 – 2018-07-09 (×2): 75 mg via ORAL
  Filled 2018-07-07 (×2): qty 1

## 2018-07-07 MED ORDER — ONDANSETRON HCL 4 MG PO TABS
4.0000 mg | ORAL_TABLET | Freq: Three times a day (TID) | ORAL | Status: DC | PRN
  Administered 2018-07-07 – 2018-07-08 (×2): 4 mg via ORAL
  Filled 2018-07-07 (×2): qty 1

## 2018-07-07 MED ORDER — METOPROLOL TARTRATE 12.5 MG HALF TABLET
12.5000 mg | ORAL_TABLET | Freq: Once | ORAL | Status: AC
  Administered 2018-07-07: 12.5 mg via ORAL
  Filled 2018-07-07: qty 1

## 2018-07-07 MED ORDER — ACETAMINOPHEN 325 MG PO TABS
650.0000 mg | ORAL_TABLET | Freq: Four times a day (QID) | ORAL | Status: DC | PRN
  Administered 2018-07-07 – 2018-07-09 (×2): 650 mg via ORAL
  Filled 2018-07-07 (×3): qty 2

## 2018-07-07 NOTE — Progress Notes (Signed)
OT Cancellation Note  Patient Details Name: [name] MRN: [id number] DOB: December 23, 1940   Cancelled Treatment:    Reason Eval/Treat Not Completed: Active bedrest order  [place], OT/L   Acute OT Clinical Specialist Acute Rehabilitation Services Pager [phone] Office [phone]  07/07/2018, 8:39 AM

## 2018-07-07 NOTE — Evaluation (Signed)
Physical Therapy Evaluation Patient Details Name: [name] MRN: [id number] DOB: 09-Apr-1941 Today's Date: 07/07/2018   History of Present Illness   77 y.o. female with history of type 2 diabetes, stroke, seizures, hypercholesterolemia, coronary artery disease and carotid stenosis bilaterally presenting with left facial droop, dysarthria, weakness in the left side. Received tPA 07/06/18 at 1239. MRI revealed Punctate R occipital infarct   Clinical Impression  Orders received for PT evaluation. Patient demonstrates deficits in functional mobility as indicated below. Will benefit from continued skilled PT to address deficits and maximize function. Will see as indicated and progress as tolerated.  Spoke with patient at length who desire to return home quickly. Given current level of deficits, poor motor processing, left sided weakness and cognitive impairments, feel patient would be an ideal candidate for comprehensive inpatient therapies to address deficits and assist patient with recovery. Patient currently very limited in actual functional abilities. OT therapies spoke with son and he is in agreement regarding this POC and states that family is able to assist with care after CIR admission if needed. At this time, feel this is the best opportunity to regain functional independence.     Follow Up Recommendations CIR    Equipment Recommendations  None recommended by PT    Recommendations for Other Services Rehab consult     Precautions / Restrictions Precautions Precautions: Fall      Mobility  Bed Mobility Overal bed mobility: Needs Assistance Bed Mobility: Supine to Sit     Supine to sit: Min assist     General bed mobility comments: significant amount of time to come to EOB, min assist with line management and multi modal cues for positioning  Transfers Overall transfer level: Needs assistance Equipment used: Rolling walker (2 wheeled) Transfers: Sit to/from Stand Sit to  Stand: Min assist;+2 safety/equipment         General transfer comment: Min assist to power to upright, significant amount of time with noted motor processing delays. Physical assist required to carry out functional movement of power up and transition  Ambulation/Gait Ambulation/Gait assistance: Min assist Gait Distance (Feet): 4 Feet Assistive device: Rolling walker (2 wheeled) Gait Pattern/deviations: Step-to pattern;Trunk flexed Gait velocity: non-functional speed Gait velocity interpretation: <1.31 ft/sec, indicative of household ambulator General Gait Details: non-functional gait at this time, Max multi modal cues for initiation and performance. continued motor processing deficits noted  Stairs            Wheelchair Mobility    Modified Rankin (Stroke Patients Only)       Balance Overall balance assessment: Needs assistance   Sitting balance-Leahy Scale: Fair     Standing balance support: During functional activity Standing balance-Leahy Scale: Poor Standing balance comment: patient able to release UE support but compensated with posterior lean/propping of LEs against furniture or alternating single extremity support                             Pertinent Vitals/Pain Pain Assessment: Faces Faces Pain Scale: Hurts little more Pain Location: left hip Pain Descriptors / Indicators: Aching;Discomfort Pain Intervention(s): Limited activity within patient's tolerance;Monitored during session;Repositioned    Home Living Family/patient expects to be discharged to:: Private residence Living Arrangements: Other relatives Available Help at Discharge: Family Type of Home: House Home Access: Stairs to enter Entrance Stairs-Rails: Left Entrance Stairs-Number of Steps: 2 Home Layout: Multi-level;Two level Home Equipment: Walker - 2 wheels;Wheelchair - manual;Cane - single point;Shower seat;Bedside commode  Prior Function Level of Independence:  Independent with assistive device(s)         Comments: household ambulator with RW, uses wheelchair for community distances     Hand Dominance   Dominant Hand: Right    Extremity/Trunk Assessment        Lower Extremity Assessment Lower Extremity Assessment: LLE deficits/detail LLE Deficits / Details: noted LLE weakness with significant resting left ankle inversion and plantarflexion position LLE Coordination: decreased fine motor;decreased gross motor       Communication      Cognition Arousal/Alertness: Awake/alert Behavior During Therapy: WFL for tasks assessed/performed Overall Cognitive Status: Impaired/Different from baseline Area of Impairment: Orientation;Attention;Memory;Following commands;Safety/judgement;Awareness;Problem solving                 Orientation Level: Disoriented to;Situation Current Attention Level: Sustained Memory: Decreased short-term memory Following Commands: Follows one step commands with increased time Safety/Judgement: Decreased awareness of deficits Awareness: Emergent Problem Solving: Slow processing;Decreased initiation;Requires verbal cues        General Comments      Exercises     Assessment/Plan    PT Assessment Patient needs continued PT services  PT Problem List Decreased strength;Decreased range of motion;Decreased activity tolerance;Decreased balance;Decreased mobility;Decreased coordination;Decreased cognition;Decreased safety awareness;Pain       PT Treatment Interventions DME instruction;Gait training;Stair training;Functional mobility training;Therapeutic activities;Therapeutic exercise;Balance training;Neuromuscular re-education;Cognitive remediation;Patient/family education    PT Goals (Current goals can be found in the Care Plan section)  Acute Rehab PT Goals Patient Stated Goal: to go home PT Goal Formulation: With patient Time For Goal Achievement: 07/21/18 Potential to Achieve Goals: Good     Frequency Min 4X/week   Barriers to discharge        Co-evaluation PT/OT/SLP Co-Evaluation/Treatment: Yes Reason for Co-Treatment: Complexity of the patient's impairments (multi-system involvement);To address functional/ADL transfers;For patient/therapist safety;Necessary to address cognition/behavior during functional activity PT goals addressed during session: Mobility/safety with mobility;Balance OT goals addressed during session: ADL's and self-care       AM-PAC PT "6 Clicks" Daily Activity  Outcome Measure Difficulty turning over in bed (including adjusting bedclothes, sheets and blankets)?: A Lot Difficulty moving from lying on back to sitting on the side of the bed? : Unable Difficulty sitting down on and standing up from a chair with arms (e.g., wheelchair, bedside commode, etc,.)?: Unable Help needed moving to and from a bed to chair (including a wheelchair)?: A Little Help needed walking in hospital room?: A Little Help needed climbing 3-5 steps with a railing? : A Lot 6 Click Score: 12    End of Session Equipment Utilized During Treatment: Gait belt Activity Tolerance: Patient tolerated treatment well Patient left: in chair;with call bell/phone within reach Nurse Communication: Mobility status PT Visit Diagnosis: Muscle weakness (generalized) (M62.81);Difficulty in walking, not elsewhere classified (R26.2)    Time: 6295-2841 PT Time Calculation (min) (ACUTE ONLY): 26 min   Charges:   PT Evaluation $PT Eval Moderate Complexity: 1 Mod          Alben Deeds, PT DPT  Board Certified Neurologic Specialist Acute Rehabilitation Services Pager [phone] Office [phone]   Duncan Dull 07/07/2018, 1:38 PM

## 2018-07-07 NOTE — Consult Note (Signed)
Physical Medicine and Rehabilitation Consult Reason for Consult: Left-sided weakness and dysarthria Referring Physician: Dr. Leonie Man  HPI: [name] is a 76 y.o. right-handed female with history of aortic insufficiency, PSVT, CAD with stenting maintained on aspirin and Plavix, hypertension, type 2 diabetes mellitus and seizure disorder maintained on Trileptal and Keppra.  Per chart review and patient, patient lives alone.  2 level home.  2 steps to entry.  Independent with assistive device prior to admission.  She does use a wheelchair for longer distances.  Son lives next door that works.  Patient states that she plans to go home with her son who is currently not working.  Presented 07/06/2018 with acute onset of left facial droop dysarthria and left-sided weakness.  Cranial CT scan reviewed, unremarkable for acute intracranial process. Per report, remote lacunar infarcts of the basal ganglia and cerebellum bilaterally.  Patient did receive TPA.  CT angios head and neck showed no emergent large vessel occlusion.  60% stenosis of the left internal carotid artery.  MRI showed punctate new white matter infarction in the anterior right occipital lobe.  No hemorrhage or mass-effect.  EEG and echocardiogram are pending.  Presently remains on aspirin and Plavix for CVA prophylaxis.  Dysphasia #3 nectar thick liquid diet.  Therapy evaluation completed with recommendations of physical medicine rehab consult.   Review of Systems  Constitutional: Negative for chills and fever.  HENT: Negative for hearing loss.   Eyes: Negative for blurred vision and double vision.  Respiratory: Negative for cough and shortness of breath.   Cardiovascular: Negative for chest pain and leg swelling.  Gastrointestinal: Positive for constipation. Negative for nausea.       GERD  Genitourinary: Negative for dysuria and flank pain.  Musculoskeletal: Positive for back pain and falls.  Skin: Negative for rash.    Neurological: Positive for speech change, focal weakness and seizures.  All other systems reviewed and are negative.  Past Medical History:  Diagnosis Date  . Anemia   . Aortic insufficiency    Moderate  . Back pain   . Carotid stenosis, bilateral   . Cirrhosis ([place])   . Closed left femoral fracture ([place]) 04/21/2017  . Coronary artery disease    Stent x 2 RCA 1995, cardiac catheterization 09/2016 showing only mild atherosclerosis  . DDD (degenerative disc disease), lumbar   . Essential hypertension   . Falls   . GERD (gastroesophageal reflux disease)   . Hiatal hernia   . History of stroke   . Hypercholesteremia   . IBS (irritable bowel syndrome)   . Non-alcoholic fatty liver disease   . OSA (obstructive sleep apnea)    no CPAP  . Pericardial effusion    a. small by echo 2018.  Marland Kitchen PSVT (paroxysmal supraventricular tachycardia) ([place])   . Recurrent UTI   . Seizures ([place])   . Stroke ([place])   . Type 2 diabetes mellitus ([place])    off medication  . Varices, esophageal ([place])    Past Surgical History:  Procedure Laterality Date  . [place]  . APPENDECTOMY    . BACK SURGERY  1985  . CARDIAC CATHETERIZATION  [id number]   Stent to the proximal RCA after MI   . CARDIAC CATHETERIZATION N/A 09/26/2016   Procedure: Left Heart Cath and Coronary Angiography;  Surgeon: Leonie Man, MD;  Location: [facility];  Service: Cardiovascular;  Laterality: N/A;  . CATARACT EXTRACTION W/PHACO Right 07/04/2014   Procedure:  CATARACT EXTRACTION PHACO AND INTRAOCULAR LENS PLACEMENT (IOC);  Surgeon: Elta Guadeloupe T. Gershon Crane, MD;  Location: [facility];  Service: Ophthalmology;  Laterality: Right;  CDE:13.13  . COLONOSCOPY    . DILATION AND CURETTAGE OF UTERUS     x2  . Epi Retinal Membrane Peel Left   . ERCP    . ESOPHAGEAL BANDING N/A 04/01/2013   Procedure: ESOPHAGEAL BANDING;  Surgeon: Rogene Houston, MD;  [facility]: [facility];  Service: Endoscopy;  Laterality: N/A;  .  ESOPHAGEAL BANDING N/A 05/24/2013   Procedure: ESOPHAGEAL BANDING;  Surgeon: Rogene Houston, MD;  [facility];  Service: Endoscopy;  Laterality: N/A;  . ESOPHAGEAL BANDING N/A 06/21/2014   Procedure: ESOPHAGEAL BANDING;  Surgeon: Rogene Houston, MD;  [facility];  Service: Endoscopy;  Laterality: N/A;  . ESOPHAGOGASTRODUODENOSCOPY N/A 04/01/2013   Procedure: ESOPHAGOGASTRODUODENOSCOPY (EGD);  Surgeon: Rogene Houston, MD;  [facility];  Service: Endoscopy;  Laterality: N/A;  230-rescheduled to 8:30am Ann notified pt  . ESOPHAGOGASTRODUODENOSCOPY N/A 05/24/2013   Procedure: ESOPHAGOGASTRODUODENOSCOPY (EGD);  Surgeon: Rogene Houston, MD;  [facility];  Service: Endoscopy;  Laterality: N/A;  730  . ESOPHAGOGASTRODUODENOSCOPY N/A 06/21/2014   Procedure: ESOPHAGOGASTRODUODENOSCOPY (EGD);  Surgeon: Rogene Houston, MD;  [facility];  Service: Endoscopy;  Laterality: N/A;  930-rescheduled 10/14 @ 1200 Ann to notify pt  . EYE SURGERY  08   cataract surgery of the left eye  . FEMUR IM NAIL Left 03/02/2017   Procedure: INTRAMEDULLARY (IM) NAIL FEMORAL;  Surgeon: Rod Can, MD;  Location: [facility];  Service: Orthopedics;  Laterality: Left;  . HARDWARE REMOVAL Right 01/17/2013   Procedure: REMOVAL OF HARDWARE AND EXCISION ULNAR STYLOID RIGHT WRIST;  Surgeon: Tennis Must, MD;  Location: [facility];  Service: Orthopedics;  Laterality: Right;  . MYRINGOTOMY  2012   both ears  . ORIF FEMUR FRACTURE Left 04/23/2017   Procedure: OPEN REDUCTION INTERNAL FIXATION (ORIF) DISTAL FEMUR FRACTURE (FRACTURE AROUND FEMORAL NAIL);  Surgeon: Altamese Herman, MD;  Location: [facility];  Service: Orthopedics;  Laterality: Left;  . TONSILLECTOMY    . VAGINAL HYSTERECTOMY  1972  . WRIST SURGERY     rt wrist hardwear removal   Family History  Problem Relation Age of Onset  . Diabetes Mother   . Heart failure Father   . Heart failure Maternal Aunt     Social History:  reports that she has been smoking cigarettes. She has a 12.50 pack-year smoking history. She has never used smokeless tobacco. She reports that she does not drink alcohol or use drugs. Allergies:  Allergies  Allergen Reactions  . Tape Rash and Other (See Comments)    Please use paper tape   Medications Prior to Admission  Medication Sig Dispense Refill  . aspirin EC 81 MG tablet Take 81 mg by mouth daily.    Marland Kitchen aspirin-acetaminophen-caffeine (EXCEDRIN MIGRAINE) 250-250-65 MG tablet Take 2 tablets by mouth as needed for headache.    Marland Kitchen atorvastatin (LIPITOR) 80 MG tablet Take 1 tablet (80 mg total) by mouth daily. 30 tablet 1  . carvedilol (COREG) 12.5 MG tablet Take 1 tablet (12.5 mg total) by mouth 2 (two) times daily with a meal. 60 tablet 1  . ciprofloxacin (CIPRO) 500 MG tablet Take 1 tablet by mouth 2 (two) times daily.    . clopidogrel (PLAVIX) 75 MG tablet Take 1 tablet (75 mg total) by mouth daily with breakfast. 30  tablet 0  . HYDROcodone-acetaminophen (NORCO) 10-325 MG tablet Take 1 tablet by mouth 4 (four) times daily as needed.    Marland Kitchen LORazepam (ATIVAN) 1 MG tablet Take 1 tablet by mouth daily as needed.    . Multiple Vitamins-Minerals (CENTRUM ADULTS) TABS Take 1 tablet by mouth daily.    . niacin (NIASPAN) 500 MG CR tablet Take 1 tablet by mouth daily.    Marland Kitchen omeprazole (PRILOSEC) 20 MG capsule Take 1 capsule (20 mg total) by mouth daily. 90 capsule 3  . ondansetron (ZOFRAN) 4 MG tablet Take 1 tablet (4 mg total) by mouth every 8 (eight) hours as needed for nausea or vomiting. 30 tablet 1  . Oxcarbazepine (TRILEPTAL) 300 MG tablet Take 1 tablet by mouth daily.    . ranitidine (ZANTAC) 150 MG tablet Take 1 tablet by mouth 2 (two) times daily as needed.    Marland Kitchen tiZANidine (ZANAFLEX) 4 MG tablet Take 1 tablet by mouth at bedtime.     . levETIRAcetam (KEPPRA) 500 MG tablet Take 1 tablet (500 mg total) by mouth 2 (two) times daily. (Patient not taking: Reported on  07/06/2018) 60 tablet 0    Home: Home Living Family/patient expects to be discharged to:: Private residence Living Arrangements: Other relatives Available Help at Discharge: Family Type of Home: House Home Access: Stairs to enter Technical brewer of Steps: 2 Entrance Stairs-Rails: Left Home Layout: Multi-level, Two level Alternate Level Stairs-Number of Steps: 12-15 steps to basement (patient states she does not go to basement) Alternate Level Stairs-Rails: Right Bathroom Accessibility: Yes Home Equipment: Environmental consultant - 2 wheels, Wheelchair - manual, [employer] - single point, Shower seat, Bedside commode  Functional History: Prior Function Level of Independence: Independent with assistive device(s) Comments: household ambulator with RW, uses wheelchair for community distances Functional Status:  Mobility: Bed Mobility Overal bed mobility: Needs Assistance Bed Mobility: Supine to Sit Supine to sit: Min assist General bed mobility comments: significant amount of time to come to EOB, min assist with line management and multi modal cues for positioning Transfers Overall transfer level: Needs assistance Equipment used: Rolling walker (2 wheeled) Transfers: Sit to/from Stand Sit to Stand: Min assist, +2 safety/equipment General transfer comment: Min assist to power to upright, significant amount of time with noted motor processing delays. Physical assist required to carry out functional movement of power up and transition Ambulation/Gait Ambulation/Gait assistance: Min assist Gait Distance (Feet): 4 Feet Assistive device: Rolling walker (2 wheeled) Gait Pattern/deviations: Step-to pattern, Trunk flexed General Gait Details: non-functional gait at this time, Max multi modal cues for initiation and performance. continued motor processing deficits noted Gait velocity: non-functional speed Gait velocity interpretation: <1.31 ft/sec, indicative of household ambulator    ADL:     Cognition: Cognition Overall Cognitive Status: Impaired/Different from baseline Orientation Level: Oriented X4 Cognition Arousal/Alertness: Awake/alert Behavior During Therapy: WFL for tasks assessed/performed Overall Cognitive Status: Impaired/Different from baseline Area of Impairment: Orientation, Attention, Memory, Following commands, Safety/judgement, Awareness, Problem solving Orientation Level: Disoriented to, Situation Current Attention Level: Sustained Memory: Decreased short-term memory Following Commands: Follows one step commands with increased time Safety/Judgement: Decreased awareness of deficits Awareness: Emergent Problem Solving: Slow processing, Decreased initiation, Requires verbal cues  Blood pressure (!) 158/63, pulse 90, temperature 99.5 F (37.5 C), temperature source Oral, resp. rate (!) 23, weight 62.2 kg, SpO2 97 %. Physical Exam  Vitals reviewed. Constitutional: She appears well-developed and well-nourished.  HENT:  Head: Normocephalic and atraumatic.  Eyes: EOM are normal. Right eye exhibits no discharge. Left  eye exhibits no discharge.  Neck: Normal range of motion. Neck supple. No thyromegaly present.  Cardiovascular: Normal rate and regular rhythm.  Respiratory: Effort normal and breath sounds normal. No respiratory distress.  GI: Soft. Bowel sounds are normal. She exhibits no distension.  Musculoskeletal: She exhibits deformity (Congenital left equinovarus).  No edema or tenderness in extremities  Neurological: She is alert.  Follows commands.   Makes good eye contact with examiner.   Provides her name and age and date of birth. Motor: Bilateral upper extremities, right lower extremity: 4+-5/5 proximal distal Left lower extremity: 4+/5, except for left equinovarus congenital deformity (baseline)  Skin: Skin is warm and dry.  Psychiatric: She has a normal mood and affect. Her behavior is normal. Thought content normal.    Results for orders  placed or performed during the hospital encounter of 07/06/18 (from the past 24 hour(s))  MRSA PCR Screening     Status: None   Collection Time: 07/06/18  4:22 PM  Result Value Ref Range   MRSA by PCR NEGATIVE NEGATIVE  Hemoglobin A1c     Status: Abnormal   Collection Time: 07/07/18  4:32 AM  Result Value Ref Range   Hgb A1c MFr Bld 4.6 (L) 4.8 - 5.6 %   Mean Plasma Glucose 85.32 mg/dL  Lipid panel     Status: None   Collection Time: 07/07/18  4:32 AM  Result Value Ref Range   Cholesterol 109 0 - 200 mg/dL   Triglycerides 50 <150 mg/dL   HDL 49 >40 mg/dL   Total CHOL/HDL Ratio 2.2 RATIO   VLDL 10 0 - 40 mg/dL   LDL Cholesterol 50 0 - 99 mg/dL   Ct Angio Head W Or Wo Contrast  Result Date: 07/06/2018 CLINICAL DATA:  Acute onset of left-sided weakness and slurred speech beginning 2 hours ago. EXAM: CT ANGIOGRAPHY HEAD AND NECK CT PERFUSION BRAIN TECHNIQUE: Multidetector CT imaging of the head and neck was performed using the standard protocol during bolus administration of intravenous contrast. Multiplanar CT image reconstructions and MIPs were obtained to evaluate the vascular anatomy. Carotid stenosis measurements (when applicable) are obtained utilizing NASCET criteria, using the distal internal carotid diameter as the denominator. Multiphase CT imaging of the brain was performed following IV bolus contrast injection. Subsequent parametric perfusion maps were calculated using RAPID software. CONTRAST:  277m ISOVUE-370 IOPAMIDOL (ISOVUE-370) INJECTION 76% COMPARISON:  CT without contrast 07/06/2018.  MRI brain 05/19/2018 FINDINGS: CTA NECK FINDINGS Aortic arch: A 3 vessel arch configuration is present. Atherosclerotic calcifications are present at the aortic arch and at the origins of the great vessels without significant stenosis relative to the more distal vessels. Calcifications along the proximal left subclavian artery are stable. Right carotid system: The right common carotid artery is  within normal limits. Atherosclerotic calcifications are again seen at the right carotid bifurcation. There is no significant stenosis. There is mild tortuosity of the cervical right ICA without significant stenosis. Left carotid system: Left common carotid artery is tortuous proximally without significant stenosis. The lumen is narrowed to 2 mm. More distal left ICA measures 4.8 mm. No tandem stenosis is present. Vertebral arteries: Atherosclerotic calcifications are present at the origin of the vertebral arteries bilaterally. There is no significant stenosis at the origins. Mild narrowing is associated with tortuosity of the non dominant left vertebral artery. There is tortuosity of the vertebral arteries more distally without other focal stenoses in the neck. Skeleton: Degenerative retrolisthesis at C3-4 and anterolisthesis at C4-5 is stable. Chronic  loss of disc height is again noted at C5-6 and C6-7 with right foraminal narrowing at C5-6. No focal lytic or blastic lesions are present. Other neck: A 10 mm left thyroid nodule is stable. No significant cervical adenopathy is present. No primary mucosal lesions are present. Salivary glands are within normal limits. Upper chest: Mild dependent atelectasis is present. No focal nodule, mass, or airspace disease is present. Review of the MIP images confirms the above findings CTA HEAD FINDINGS Anterior circulation: Atherosclerotic calcifications are again seen within the cavernous internal carotid arteries bilaterally. There is no significant stenosis proximal to the ICA termini. The A1 and M1 segments are normal. MCA bifurcations are intact. There is some attenuation of distal branch vessels. ACA and MCA branch vessels are otherwise normal. The anterior communicating artery is patent. No aneurysms are present. Posterior circulation: The right vertebral artery is the dominant vessel. PICA origins are visualized and normal. The vertebrobasilar junction is normal. Both  posterior cerebral arteries originate from the basilar tip. Basilar artery is unremarkable. Moderate right P2 segment stenosis is again seen. PCA branch vessels are otherwise stable. Venous sinuses: The dural sinuses are patent. Straight sinus deep cerebral veins are intact. Cortical veins are unremarkable. Anatomic variants: None Review of the MIP images confirms the above findings CT Brain Perfusion Findings: CBF (<30%) Volume: 7m Perfusion (Tmax>6.0s) volume: 015mMismatch Volume: 44m65mnfarction Location:N/a IMPRESSION: 1. CT perfusion demonstrates no areas of core infarct or ischemic tissue at risk. 2. CTA demonstrates no emergent large vessel occlusion. 3. 60% stenosis of the left internal carotid artery is stable. 4. Atherosclerotic changes at the aortic arch and right carotid bifurcation are stable without significant stenoses. 5. Left subclavian artery atherosclerotic disease without significant stenosis. 6. Stable calcifications of the cavernous internal carotid arteries bilaterally without significant stenoses. 7. Stable moderate right P2 segment stenosis. 8. Multilevel degenerative changes of the cervical spine are stable. These results were called by telephone at the time of interpretation on 07/06/2018 at 1:16 pm to Dr. ZAMRoderic Palauwho verbally acknowledged these results. Electronically Signed   By: ChrSan MorelleD.   On: 07/06/2018 13:16   Ct Angio Neck W Or Wo Contrast  Result Date: 07/06/2018 CLINICAL DATA:  Acute onset of left-sided weakness and slurred speech beginning 2 hours ago. EXAM: CT ANGIOGRAPHY HEAD AND NECK CT PERFUSION BRAIN TECHNIQUE: Multidetector CT imaging of the head and neck was performed using the standard protocol during bolus administration of intravenous contrast. Multiplanar CT image reconstructions and MIPs were obtained to evaluate the vascular anatomy. Carotid stenosis measurements (when applicable) are obtained utilizing NASCET criteria, using the distal internal  carotid diameter as the denominator. Multiphase CT imaging of the brain was performed following IV bolus contrast injection. Subsequent parametric perfusion maps were calculated using RAPID software. CONTRAST:  215m744mOVUE-370 IOPAMIDOL (ISOVUE-370) INJECTION 76% COMPARISON:  CT without contrast 07/06/2018.  MRI brain 05/19/2018 FINDINGS: CTA NECK FINDINGS Aortic arch: A 3 vessel arch configuration is present. Atherosclerotic calcifications are present at the aortic arch and at the origins of the great vessels without significant stenosis relative to the more distal vessels. Calcifications along the proximal left subclavian artery are stable. Right carotid system: The right common carotid artery is within normal limits. Atherosclerotic calcifications are again seen at the right carotid bifurcation. There is no significant stenosis. There is mild tortuosity of the cervical right ICA without significant stenosis. Left carotid system: Left common carotid artery is tortuous proximally without significant stenosis. The lumen is narrowed  to 2 mm. More distal left ICA measures 4.8 mm. No tandem stenosis is present. Vertebral arteries: Atherosclerotic calcifications are present at the origin of the vertebral arteries bilaterally. There is no significant stenosis at the origins. Mild narrowing is associated with tortuosity of the non dominant left vertebral artery. There is tortuosity of the vertebral arteries more distally without other focal stenoses in the neck. Skeleton: Degenerative retrolisthesis at C3-4 and anterolisthesis at C4-5 is stable. Chronic loss of disc height is again noted at C5-6 and C6-7 with right foraminal narrowing at C5-6. No focal lytic or blastic lesions are present. Other neck: A 10 mm left thyroid nodule is stable. No significant cervical adenopathy is present. No primary mucosal lesions are present. Salivary glands are within normal limits. Upper chest: Mild dependent atelectasis is present. No  focal nodule, mass, or airspace disease is present. Review of the MIP images confirms the above findings CTA HEAD FINDINGS Anterior circulation: Atherosclerotic calcifications are again seen within the cavernous internal carotid arteries bilaterally. There is no significant stenosis proximal to the ICA termini. The A1 and M1 segments are normal. MCA bifurcations are intact. There is some attenuation of distal branch vessels. ACA and MCA branch vessels are otherwise normal. The anterior communicating artery is patent. No aneurysms are present. Posterior circulation: The right vertebral artery is the dominant vessel. PICA origins are visualized and normal. The vertebrobasilar junction is normal. Both posterior cerebral arteries originate from the basilar tip. Basilar artery is unremarkable. Moderate right P2 segment stenosis is again seen. PCA branch vessels are otherwise stable. Venous sinuses: The dural sinuses are patent. Straight sinus deep cerebral veins are intact. Cortical veins are unremarkable. Anatomic variants: None Review of the MIP images confirms the above findings CT Brain Perfusion Findings: CBF (<30%) Volume: 94m Perfusion (Tmax>6.0s) volume: 031mMismatch Volume: 79m29mnfarction Location:N/a IMPRESSION: 1. CT perfusion demonstrates no areas of core infarct or ischemic tissue at risk. 2. CTA demonstrates no emergent large vessel occlusion. 3. 60% stenosis of the left internal carotid artery is stable. 4. Atherosclerotic changes at the aortic arch and right carotid bifurcation are stable without significant stenoses. 5. Left subclavian artery atherosclerotic disease without significant stenosis. 6. Stable calcifications of the cavernous internal carotid arteries bilaterally without significant stenoses. 7. Stable moderate right P2 segment stenosis. 8. Multilevel degenerative changes of the cervical spine are stable. These results were called by telephone at the time of interpretation on 07/06/2018 at  1:16 pm to Dr. ZAMRoderic Palauwho verbally acknowledged these results. Electronically Signed   By: ChrSan MorelleD.   On: 07/06/2018 13:16   Mr Brain Wo Contrast  Result Date: 07/07/2018 CLINICAL DATA:  [age]ar old female code stroke presentation on 07/06/2018. Chronic right MCA infarct EXAM: MRI HEAD WITHOUT CONTRAST TECHNIQUE: Multiplanar, multiecho pulse sequences of the brain and surrounding structures were obtained without intravenous contrast. COMPARISON:  CTA head and neck, CT perfusion and noncontrast head CT [facility]06[facility]2018[facility] earlier. FINDINGS: Brain: Persistent small area of trace diffusion abnormality in the left superior frontal gyrus subcortical white matter (series 5, image 60 and series 7, image 45), not significantly changed since August. Corresponding T2 and FLAIR hyperintensity has developed. No hemorrhage or mass effect. New punctate focus of restricted diffusion right inferior occipital lobe white matter on series 5, image 46. No hemorrhage or mass effect. No other convincing restricted diffusion. Stable middle right MCA territory encephalomalacia with hemosiderin. Stable small chronic infarcts in the cerebellum and left deep  gray matter. Stable patchy T2 and FLAIR hyperintensity in the pons and scattered cerebral white matter. No midline shift, mass effect, evidence of mass lesion, ventriculomegaly, extra-axial collection or acute intracranial hemorrhage. Cervicomedullary junction and pituitary are within normal limits. Vascular: Major intracranial vascular flow voids are stable. Skull and upper cervical spine: Chronic C3-C4 disc and endplate degeneration. Visualized bone marrow signal is within normal limits. Sinuses/Orbits: Stable and negative. Other: Stable trace mastoid effusions. Negative nasopharynx. Scalp and face soft tissues are negative. IMPRESSION: 1. Punctate new white matter infarct in the anterior right occipital lobe. No hemorrhage or  mass effect. 2. No other acute intracranial abnormality. Evolution of the small left superior frontal gyrus lacune since August with persistent signal abnormality on trace diffusion. Chronic middle right MCA territory encephalomalacia. Chronic underlying small vessel disease. Electronically Signed   By: Genevie Ann M.D.   On: 07/07/2018 11:19   Ct Cerebral Perfusion W Contrast  Result Date: 07/06/2018 CLINICAL DATA:  Acute onset of left-sided weakness and slurred speech beginning 2 hours ago. EXAM: CT ANGIOGRAPHY HEAD AND NECK CT PERFUSION BRAIN TECHNIQUE: Multidetector CT imaging of the head and neck was performed using the standard protocol during bolus administration of intravenous contrast. Multiplanar CT image reconstructions and MIPs were obtained to evaluate the vascular anatomy. Carotid stenosis measurements (when applicable) are obtained utilizing NASCET criteria, using the distal internal carotid diameter as the denominator. Multiphase CT imaging of the brain was performed following IV bolus contrast injection. Subsequent parametric perfusion maps were calculated using RAPID software. CONTRAST:  253m ISOVUE-370 IOPAMIDOL (ISOVUE-370) INJECTION 76% COMPARISON:  CT without contrast 07/06/2018.  MRI brain 05/19/2018 FINDINGS: CTA NECK FINDINGS Aortic arch: A 3 vessel arch configuration is present. Atherosclerotic calcifications are present at the aortic arch and at the origins of the great vessels without significant stenosis relative to the more distal vessels. Calcifications along the proximal left subclavian artery are stable. Right carotid system: The right common carotid artery is within normal limits. Atherosclerotic calcifications are again seen at the right carotid bifurcation. There is no significant stenosis. There is mild tortuosity of the cervical right ICA without significant stenosis. Left carotid system: Left common carotid artery is tortuous proximally without significant stenosis. The lumen  is narrowed to 2 mm. More distal left ICA measures 4.8 mm. No tandem stenosis is present. Vertebral arteries: Atherosclerotic calcifications are present at the origin of the vertebral arteries bilaterally. There is no significant stenosis at the origins. Mild narrowing is associated with tortuosity of the non dominant left vertebral artery. There is tortuosity of the vertebral arteries more distally without other focal stenoses in the neck. Skeleton: Degenerative retrolisthesis at C3-4 and anterolisthesis at C4-5 is stable. Chronic loss of disc height is again noted at C5-6 and C6-7 with right foraminal narrowing at C5-6. No focal lytic or blastic lesions are present. Other neck: A 10 mm left thyroid nodule is stable. No significant cervical adenopathy is present. No primary mucosal lesions are present. Salivary glands are within normal limits. Upper chest: Mild dependent atelectasis is present. No focal nodule, mass, or airspace disease is present. Review of the MIP images confirms the above findings CTA HEAD FINDINGS Anterior circulation: Atherosclerotic calcifications are again seen within the cavernous internal carotid arteries bilaterally. There is no significant stenosis proximal to the ICA termini. The A1 and M1 segments are normal. MCA bifurcations are intact. There is some attenuation of distal branch vessels. ACA and MCA branch vessels are otherwise normal. The anterior communicating artery is  patent. No aneurysms are present. Posterior circulation: The right vertebral artery is the dominant vessel. PICA origins are visualized and normal. The vertebrobasilar junction is normal. Both posterior cerebral arteries originate from the basilar tip. Basilar artery is unremarkable. Moderate right P2 segment stenosis is again seen. PCA branch vessels are otherwise stable. Venous sinuses: The dural sinuses are patent. Straight sinus deep cerebral veins are intact. Cortical veins are unremarkable. Anatomic variants:  None Review of the MIP images confirms the above findings CT Brain Perfusion Findings: CBF (<30%) Volume: 18m Perfusion (Tmax>6.0s) volume: 034mMismatch Volume: 60m85mnfarction Location:N/a IMPRESSION: 1. CT perfusion demonstrates no areas of core infarct or ischemic tissue at risk. 2. CTA demonstrates no emergent large vessel occlusion. 3. 60% stenosis of the left internal carotid artery is stable. 4. Atherosclerotic changes at the aortic arch and right carotid bifurcation are stable without significant stenoses. 5. Left subclavian artery atherosclerotic disease without significant stenosis. 6. Stable calcifications of the cavernous internal carotid arteries bilaterally without significant stenoses. 7. Stable moderate right P2 segment stenosis. 8. Multilevel degenerative changes of the cervical spine are stable. These results were called by telephone at the time of interpretation on 07/06/2018 at 1:16 pm to Dr. ZAMRoderic Palauwho verbally acknowledged these results. Electronically Signed   By: ChrSan MorelleD.   On: 07/06/2018 13:16   Ct Head Code Stroke Wo Contrast  Result Date: 07/06/2018 CLINICAL DATA:  Code stroke. Acute onset of left-sided weakness today. Dizziness. EXAM: CT HEAD WITHOUT CONTRAST TECHNIQUE: Contiguous axial images were obtained from the base of the skull through the vertex without intravenous contrast. COMPARISON:  CT head without contrast 05/19/2018. MRI brain 05/19/2018. FINDINGS: Brain: Remote lacunar infarcts of the basal ganglia and internal capsule are stable. Remote right MCA territory encephalomalacia is stable. No acute infarct, hemorrhage, or mass lesion is present. The insular cortex is within normal limits bilaterally. No new cortical lesions are present. Remote lacunar infarcts are present in the cerebellum bilaterally. Brainstem is within normal limits. The ventricles are of normal size. No significant extraaxial fluid collection is present. Vascular: Dense atherosclerotic  changes are present within the cavernous internal carotid arteries bilaterally and at the dural margin of both vertebral arteries. There is no significant hyperdense vessel. Skull: Calvarium is intact. No focal lytic or blastic lesions are present. Sinuses/Orbits: The paranasal sinuses and mastoid air cells are clear. Globes and orbits are within normal limits. ASPECTS ([facility] Early CT Score) - Ganglionic level infarction (caudate, lentiform nuclei, internal capsule, insula, M1-M3 cortex): 7/7 - Supraganglionic infarction (M4-M6 cortex): 3/3 Total score (0-10 with 10 being normal): 10/10 IMPRESSION: 1. No acute intracranial abnormality. 2. Stable right MCA territory encephalomalacia without definite expansion of the infarct territory. 3. Remote lacunar infarcts of the basal ganglia and cerebellum bilaterally are stable. 4. Atherosclerosis. 5. ASPECTS is 10/10 These results were called by telephone at the time of interpretation on 07/06/2018 at 12:00 pm to Dr. JOSMilton Fergusonwho verbally acknowledged these results. Electronically Signed   By: ChrSan MorelleD.   On: 07/06/2018 12:00    Assessment/Plan: Diagnosis: Anterior right occipital lobe infarct Labs and images (see above) independently reviewed.  Records reviewed and summated above.  1. Does the need for close, 24 hr/day medical supervision in concert with the patient's rehab needs make it unreasonable for this patient to be served in a less intensive setting? Potentially  2. Co-Morbidities requiring supervision/potential complications: aortic insufficiency (Monitor in accordance with increased physical activity and avoid  UE resistance excercises), PSVT (monitor HR with increased physical activity), CAD (cont meds), HTN (monitor and provide prns in accordance with increased physical exertion and pain), type 2 DM (Monitor in accordance with exercise and adjust meds as necessary), seizure (consider meds), tachypnea (monitor RR and  O2 Sats with increased physical exertion), abx (consider change in Cipro due to seizure threshold), hypokalemia (continue to monitor and replete as necessary), post-stroke dysphagia (advance diet as tolerated) 3. Due to safety, disease management and patient education, does the patient require 24 hr/day rehab nursing? Potentially 4. Does the patient require coordinated care of a physician, rehab nurse, PT (1-2 hrs/day, 5 days/week), OT (1-2 hrs/day, 5 days/week) and SLP (1-2 hrs/day, 5 days/week) to address physical and functional deficits in the context of the above medical diagnosis(es)? Yes Addressing deficits in the following areas: balance, endurance, locomotion, strength, transferring, bathing, dressing, toileting and psychosocial support 5. Can the patient actively participate in an intensive therapy program of at least 3 hrs of therapy per day at least 5 days per week? Yes 6. The potential for patient to make measurable gains while on inpatient rehab is excellent 7. Anticipated functional outcomes upon discharge from inpatient rehab are supervision  with PT, supervision with OT, n/a with SLP. 8. Estimated rehab length of stay to reach the above functional goals is: 4-7 days. 9. Anticipated D/C setting: Home 10. Anticipated post D/C treatments: HH therapy and Home excercise program 11. Overall Rehab/Functional Prognosis: good  RECOMMENDATIONS: This patient's condition is appropriate for continued rehabilitative care in the following setting: Patient states that she was told she is going home tomorrow morning.  Given current deficits and caregiver availablility, believe this is reasonable.  Patient does not prefer CIR.  Recommend outpatient PM&R follow up. Patient has agreed to participate in recommended program. Potentially Note that insurance prior authorization may be required for reimbursement for recommended care.  Comment: Rehab Admissions Coordinator to follow up.  I have personally  performed a face to face diagnostic evaluation, including, but not limited to relevant history and physical exam findings, of this patient and developed relevant assessment and plan.  Additionally, I have reviewed and concur with the physician assistant's documentation above.   Delice Lesch, MD, ABPMR Lavon Paganini Angiulli, PA-C 07/07/2018

## 2018-07-07 NOTE — Progress Notes (Signed)
PT Cancellation Note  Patient Details Name: [name] MRN: [id number] DOB: [birth year]   Cancelled Treatment:    Reason Eval/Treat Not Completed: Active bedrest order   Duncan Dull 07/07/2018, 11:39 AM Alben Deeds, PT DPT  Board Certified Neurologic Specialist Acute Rehabilitation Services Pager [phone] Office [phone]

## 2018-07-07 NOTE — Progress Notes (Signed)
  Echocardiogram 2D Echocardiogram has been performed.  Johny Chess 07/07/2018, 2:07 PM

## 2018-07-07 NOTE — Progress Notes (Signed)
Chaplain responded to spiritual care consult.  Pt not in room at present time. Will continue to follow up later today. Tamsen Snider Pager [phone]

## 2018-07-07 NOTE — Progress Notes (Signed)
Inpatient Rehabilitation Admissions Coordinator   Patient was screened by Cleatrice Burke for appropriateness for an Inpatient Acute Rehab Consult per PT recommendation.   At this time, we are recommending Inpatient Rehab consult.  Cleatrice Burke 07/07/2018, 1:52 PM  I can be reached at [phone].

## 2018-07-07 NOTE — Progress Notes (Signed)
OT Evaluation  PTA, pt lived with her grand daughter and was able to mobilize @ RW level and complete ADL with S. Son states his "Mom was cooking dinner last week". Pt demonstrates a significant decline in function and would benefit from rehab at CIR to facilitate return to PLOF. Will follow acutely to addressed established goals.     07/07/18 1600  OT Visit Information  Assistance Needed +1  PT/OT/SLP Co-Evaluation/Treatment Yes  Reason for Co-Treatment To address functional/ADL transfers;Necessary to address cognition/behavior during functional activity  OT goals addressed during session ADL's and self-care  History of Present Illness  77 y.o. female with history of type 2 diabetes, stroke, seizures, hypercholesterolemia, coronary artery disease and carotid stenosis bilaterally presenting with left facial droop, dysarthria, weakness in the left side. Received tPA 07/06/18 at 1239. MRI revealed Punctate R occipital infarct   Precautions  Precautions Maribel expects to be discharged to: Private residence  Living Arrangements Other relatives  Available Help at Discharge Family  Type of [place] to enter  Entrance Stairs-Number of Steps 2  Entrance Stairs-Rails Left  Home Layout Multi-level;Two level  Alternate Level Stairs-Number of Steps 12-15 steps to basement (patient states she does not go to basement)  Alternate Level Stairs-Rails Right  Bathroom Shower/Tub Tub/shower unit  Corporate treasurer Yes  How Accessible Accessible via walker  [place] - 2 wheels;Wheelchair - manual;Cane - single point;Shower seat;BSC;Grab bars - tub/shower  Prior Function  Level of Independence Needs assistance  Gait / Transfers Assistance Needed Primarily uses RW per son  ADL's / Homemaking Assistance Needed pt able to complete ADL with S of family; pt cooking dinner last week  Comments household ambulator with  RW, uses wheelchair for community distances  Communication  Communication No difficulties  Pain Assessment  Pain Assessment Faces  Faces Pain Scale 4  Pain Location left hip  Pain Descriptors / Indicators Aching;Discomfort  Pain Intervention(s) Limited activity within patient's tolerance  Cognition  Arousal/Alertness Awake/alert  Behavior During Therapy WFL for tasks assessed/performed  Overall Cognitive Status Impaired/Different from baseline  Area of Impairment Orientation;Attention;Memory;Following commands;Safety/judgement;Awareness;Problem solving  Orientation Level Disoriented to;Situation  Current Attention Level Sustained  Memory Decreased short-term memory  Following Commands Follows one step commands with increased time  Safety/Judgement Decreased awareness of deficits  Awareness Emergent  Problem Solving Slow processing;Decreased initiation;Requires verbal cues  Upper Extremity Assessment  Upper Extremity Assessment Generalized weakness;LUE deficits/detail  LUE Deficits / Details using functionally however movemetns are slow and intentional; decreased in-hand manipulation skills  LUE Coordination decreased fine motor  Lower Extremity Assessment  Lower Extremity Assessment Defer to PT evaluation  LLE Deficits / Details noted LLE weakness with significant resting left ankle inversion and plantarflexion position  LLE Coordination decreased fine motor;decreased gross motor  Cervical / Trunk Assessment  Cervical / Trunk Assessment Kyphotic  ADL  Overall ADL's  Needs assistance/impaired  Eating/Feeding Minimal assistance  Eating/Feeding Details (indicate cue type and reason) nectar thick  Grooming Supervision/safety;Set up;Sitting  Upper Body Bathing Minimal assistance;Sitting  Lower Body Bathing Moderate assistance;Sit to/from stand  Upper Body Dressing  Moderate assistance;Sitting  Lower Body Dressing Moderate assistance;Sit to/from Retail buyer Minimal  assistance  Toileting- Clothing Manipulation and Hygiene Moderate assistance;Sit to/from stand  Functional mobility during ADLs +2 for safety/equipment;Minimal assistance  General ADL Comments difficulty with initiation; very slow movements; requires physical assist to stand from chair (took @ 5 steps  in 5 min)  Vision- History  Baseline Vision/History Wears glasses  Wears Glasses At all times  Vision- Assessment  Vision Assessment? Vision impaired- to be further tested in functional context  Additional Comments increased difficulty with reading; unsure if pt has visual field deficit - will further assess  Praxis  Praxis tested? Deficits  Deficits Initiation  Bed Mobility  Overal bed mobility Needs Assistance  Bed Mobility Supine to Sit  Supine to sit Min assist  General bed mobility comments significant amount of time to come to EOB, min assist with line management and multi modal cues for positioning  Transfers  Overall transfer level Needs assistance  Equipment used Rolling walker (2 wheeled)  Transfers Sit to/from Stand  Sit to Stand Min assist;+2 safety/equipment  General transfer comment Min assist to power to upright, significant amount of time with noted motor processing delays. Physical assist required to carry out functional movement of power up and transition  Balance  Overall balance assessment Needs assistance  Sitting balance-Leahy Scale Fair  Standing balance support During functional activity  Standing balance-Leahy Scale Poor  Standing balance comment patient able to release UE support but compensated with posterior lean/propping of LEs against furniture or alternating single extremity support  OT - End of Session  Equipment Utilized During Treatment Gait belt;Rolling walker  Activity Tolerance Patient tolerated treatment well  Patient left in chair;with call bell/phone within reach;with chair alarm set  Nurse Communication Mobility status  OT Assessment  OT  Recommendation/Assessment Patient needs continued OT Services  OT Visit Diagnosis Other abnormalities of gait and mobility (R26.89);Muscle weakness (generalized) (M62.81);Low vision, both eyes (H54.2);Other symptoms and signs involving cognitive function;Pain  Pain - Right/Left Left  Pain - part of body Hip  OT Problem List Decreased strength;Decreased activity tolerance;Impaired balance (sitting and/or standing);Impaired vision/perception;Decreased coordination;Decreased cognition;Decreased safety awareness;Decreased knowledge of use of DME or AE;Pain  OT Plan  OT Frequency (ACUTE ONLY) Min 2X/week  OT Treatment/Interventions (ACUTE ONLY) Self-care/ADL training;Neuromuscular education;DME and/or AE instruction;Therapeutic activities;Cognitive remediation/compensation;Visual/perceptual remediation/compensation;Patient/family education;Balance training  AM-PAC OT "6 Clicks" Daily Activity Outcome Measure  Help from another person eating meals? 3  Help from another person taking care of personal grooming? 3  Help from another person toileting, which includes using toliet, bedpan, or urinal? 2  Help from another person bathing (including washing, rinsing, drying)? 2  Help from another person to put on and taking off regular upper body clothing? 3  Help from another person to put on and taking off regular lower body clothing? 2  6 Click Score 15  ADL G Code Conversion CK  OT Recommendation  Recommendations for Other Services Rehab consult  Follow Up Recommendations CIR;Supervision/Assistance - 24 hour  OT Equipment None recommended by OT  Individuals Consulted  Consulted and Agree with Results and Recommendations Patient;Family Midwife (son over the phone)  Family Member Consulted son  Acute Rehab OT Goals  Patient Stated Goal to go home  OT Goal Formulation With patient  Time For Goal Achievement 07/21/18  Potential to Achieve Goals Good  OT Time Calculation  OT Start Time (ACUTE  ONLY) 1214  OT Stop Time (ACUTE ONLY) 1249  OT Time Calculation (min) 35 min  OT General Charges  $OT Visit 1 Visit  OT Evaluation  $OT Eval Moderate Complexity 1 Mod  Written Expression  Dominant Hand Right  Maurie Boettcher, OT/L   Acute OT Clinical Specialist Acute Rehabilitation Services Pager [phone] Office [phone]

## 2018-07-07 NOTE — Procedures (Signed)
History: 77 year old female presenting with left-sided weakness  Sedation: None  Technique: This is a 21 channel routine scalp EEG performed at the bedside with bipolar and monopolar montages arranged in accordance to the international 10/20 system of electrode placement. One channel was dedicated to EKG recording.    Background: The background consists of intermixed alpha and beta activities. There is a well defined posterior dominant rhythm of 10 Hz that attenuates with eye opening. Sleep is not recorded.  There is a prominent wicket rhythm which is better seen on the left than right side.  This is a normal variant  Photic stimulation: Physiologic driving is not performed  EEG Abnormalities: 1) suggestion of asymmetry with attenuation of faster frequencies in the right  Clinical Interpretation: This normal EEG is recorded in the waking and drowsy state.  Though with it rhythms are commonly slightly asymmetric, the asymmetry here could be suggestive of a right hemispheric cortical dysfunction(consistent with the patient's known encephalomalacia).  There was no seizure or seizure predisposition recorded on this study. Please note that a normal EEG does not preclude the possibility of epilepsy.   Roland Rack, MD [facility] [phone]  If 7pm- 7am, please page neurology on call as listed in [place].

## 2018-07-07 NOTE — Progress Notes (Signed)
EEG completed, results pending. 

## 2018-07-07 NOTE — Progress Notes (Signed)
[name] stated that patient's IV came out during transport to [facility]. She spoke to Dr. Leonie Man and he stated that patient no longer needs fluids or IV as she is planned for probable discharge tomorrow.

## 2018-07-07 NOTE — Progress Notes (Addendum)
STROKE TEAM PROGRESS NOTE  HPI:( Dr Leonel Ramsay ) [name] is a 77 y.o. female with history of type 2 diabetes, stroke, seizures, hypercholesterolemia, coronary artery disease and carotid stenosis bilaterally.  Per daughter patient was doing well today when her granddaughter noted a sudden onset of left facial droop, dysarthria, weakness in the left side.  She called her mother and explained what was going on.  [facility] was [facility] where patient was evaluated by tele-neurology.  Patient was well within the window for TPA.  Patient had no contraindications and administer TPA.  Patient was then transferred to [facility] ER.  Upon consultation per daughter she had improved significantly.  She no longer dysarthria, her left facial droop had improved, her left arm had become stronger however her left leg is still slightly weak.   LKW: 07/06/2018 at 11 AM tpa given?:  Yes Premorbid modified Rankin scale (mRS): 0 NIH stroke scale while at [facility] 2  INTERVAL HISTORY No family is at the bedside.  States she feels back to normal. For MRI to confirm stroke today. Doing well post tPA.Blood pressure is adequately controlled  Vitals:   07/07/18 0900 07/07/18 1000 07/07/18 1100 07/07/18 1200  BP: (!) 144/52 (!) 160/54 (!) 163/49 (!) 151/48  Pulse: (!) 103 (!) 103 95 98  Resp: (!) 23 13 15  (!) 23  Temp:      TempSrc:      SpO2: 93% 97% 95% 94%  Weight:        CBC:  Recent Labs  Lab 07/06/18 1134  WBC 6.4  NEUTROABS 3.4  HGB 10.8*  10.2*  HCT 32.8*  30.0*  MCV 96.5  PLT 135*    Basic Metabolic Panel:  Recent Labs  Lab 07/06/18 1134  NA 140  141  K 3.3*  3.4*  CL 107  103  CO2 27  GLUCOSE 192*  188*  BUN 13  11  CREATININE 1.04*  1.10*  CALCIUM 9.0   Lipid Panel:     Component Value Date/Time   CHOL 109 07/07/2018 0432   CHOL 142 12/07/2013 1038   TRIG 50 07/07/2018 0432   TRIG 98 12/07/2013 1038   HDL 49 07/07/2018 0432   HDL 47 12/07/2013  1038   CHOLHDL 2.2 07/07/2018 0432   VLDL 10 07/07/2018 0432   LDLCALC 50 07/07/2018 0432   LDLCALC 83 09/04/2017 1023   LDLCALC 75 12/07/2013 1038   HgbA1c:  Lab Results  Component Value Date   HGBA1C 4.6 (L) 07/07/2018   Urine Drug Screen:     Component Value Date/Time   LABOPIA NONE DETECTED 05/19/2018 1425   COCAINSCRNUR NONE DETECTED 05/19/2018 1425   LABBENZ POSITIVE (A) 05/19/2018 1425   AMPHETMU NONE DETECTED 05/19/2018 1425   THCU NONE DETECTED 05/19/2018 1425   LABBARB NONE DETECTED 05/19/2018 1425    Alcohol Level     Component Value Date/Time   ETH <10 07/06/2018 1134    IMAGING Ct Angio Head W Or Wo Contrast  Result Date: 07/06/2018 CLINICAL DATA:  Acute onset of left-sided weakness and slurred speech beginning 2 hours ago. EXAM: CT ANGIOGRAPHY HEAD AND NECK CT PERFUSION BRAIN TECHNIQUE: Multidetector CT imaging of the head and neck was performed using the standard protocol during bolus administration of intravenous contrast. Multiplanar CT image reconstructions and MIPs were obtained to evaluate the vascular anatomy. Carotid stenosis measurements (when applicable) are obtained utilizing NASCET criteria, using the distal internal carotid diameter as the denominator. Multiphase  CT imaging of the brain was performed following IV bolus contrast injection. Subsequent parametric perfusion maps were calculated using RAPID software. CONTRAST:  259m ISOVUE-370 IOPAMIDOL (ISOVUE-370) INJECTION 76% COMPARISON:  CT without contrast 07/06/2018.  MRI brain 05/19/2018 FINDINGS: CTA NECK FINDINGS Aortic arch: A 3 vessel arch configuration is present. Atherosclerotic calcifications are present at the aortic arch and at the origins of the great vessels without significant stenosis relative to the more distal vessels. Calcifications along the proximal left subclavian artery are stable. Right carotid system: The right common carotid artery is within normal limits. Atherosclerotic  calcifications are again seen at the right carotid bifurcation. There is no significant stenosis. There is mild tortuosity of the cervical right ICA without significant stenosis. Left carotid system: Left common carotid artery is tortuous proximally without significant stenosis. The lumen is narrowed to 2 mm. More distal left ICA measures 4.8 mm. No tandem stenosis is present. Vertebral arteries: Atherosclerotic calcifications are present at the origin of the vertebral arteries bilaterally. There is no significant stenosis at the origins. Mild narrowing is associated with tortuosity of the non dominant left vertebral artery. There is tortuosity of the vertebral arteries more distally without other focal stenoses in the neck. Skeleton: Degenerative retrolisthesis at C3-4 and anterolisthesis at C4-5 is stable. Chronic loss of disc height is again noted at C5-6 and C6-7 with right foraminal narrowing at C5-6. No focal lytic or blastic lesions are present. Other neck: A 10 mm left thyroid nodule is stable. No significant cervical adenopathy is present. No primary mucosal lesions are present. Salivary glands are within normal limits. Upper chest: Mild dependent atelectasis is present. No focal nodule, mass, or airspace disease is present. Review of the MIP images confirms the above findings CTA HEAD FINDINGS Anterior circulation: Atherosclerotic calcifications are again seen within the cavernous internal carotid arteries bilaterally. There is no significant stenosis proximal to the ICA termini. The A1 and M1 segments are normal. MCA bifurcations are intact. There is some attenuation of distal branch vessels. ACA and MCA branch vessels are otherwise normal. The anterior communicating artery is patent. No aneurysms are present. Posterior circulation: The right vertebral artery is the dominant vessel. PICA origins are visualized and normal. The vertebrobasilar junction is normal. Both posterior cerebral arteries originate  from the basilar tip. Basilar artery is unremarkable. Moderate right P2 segment stenosis is again seen. PCA branch vessels are otherwise stable. Venous sinuses: The dural sinuses are patent. Straight sinus deep cerebral veins are intact. Cortical veins are unremarkable. Anatomic variants: None Review of the MIP images confirms the above findings CT Brain Perfusion Findings: CBF (<30%) Volume: 085mPerfusion (Tmax>6.0s) volume: 53m29mismatch Volume: 53mL3mfarction Location:N/a IMPRESSION: 1. CT perfusion demonstrates no areas of core infarct or ischemic tissue at risk. 2. CTA demonstrates no emergent large vessel occlusion. 3. 60% stenosis of the left internal carotid artery is stable. 4. Atherosclerotic changes at the aortic arch and right carotid bifurcation are stable without significant stenoses. 5. Left subclavian artery atherosclerotic disease without significant stenosis. 6. Stable calcifications of the cavernous internal carotid arteries bilaterally without significant stenoses. 7. Stable moderate right P2 segment stenosis. 8. Multilevel degenerative changes of the cervical spine are stable. These results were called by telephone at the time of interpretation on 07/06/2018 at 1:16 pm to Dr. ZAMMRoderic Palauho verbally acknowledged these results. Electronically Signed   By: ChriSan Morelle.   On: 07/06/2018 13:16   Ct Angio Neck W Or Wo Contrast  Result Date: 07/06/2018  CLINICAL DATA:  Acute onset of left-sided weakness and slurred speech beginning 2 hours ago. EXAM: CT ANGIOGRAPHY HEAD AND NECK CT PERFUSION BRAIN TECHNIQUE: Multidetector CT imaging of the head and neck was performed using the standard protocol during bolus administration of intravenous contrast. Multiplanar CT image reconstructions and MIPs were obtained to evaluate the vascular anatomy. Carotid stenosis measurements (when applicable) are obtained utilizing NASCET criteria, using the distal internal carotid diameter as the denominator.  Multiphase CT imaging of the brain was performed following IV bolus contrast injection. Subsequent parametric perfusion maps were calculated using RAPID software. CONTRAST:  289m ISOVUE-370 IOPAMIDOL (ISOVUE-370) INJECTION 76% COMPARISON:  CT without contrast 07/06/2018.  MRI brain 05/19/2018 FINDINGS: CTA NECK FINDINGS Aortic arch: A 3 vessel arch configuration is present. Atherosclerotic calcifications are present at the aortic arch and at the origins of the great vessels without significant stenosis relative to the more distal vessels. Calcifications along the proximal left subclavian artery are stable. Right carotid system: The right common carotid artery is within normal limits. Atherosclerotic calcifications are again seen at the right carotid bifurcation. There is no significant stenosis. There is mild tortuosity of the cervical right ICA without significant stenosis. Left carotid system: Left common carotid artery is tortuous proximally without significant stenosis. The lumen is narrowed to 2 mm. More distal left ICA measures 4.8 mm. No tandem stenosis is present. Vertebral arteries: Atherosclerotic calcifications are present at the origin of the vertebral arteries bilaterally. There is no significant stenosis at the origins. Mild narrowing is associated with tortuosity of the non dominant left vertebral artery. There is tortuosity of the vertebral arteries more distally without other focal stenoses in the neck. Skeleton: Degenerative retrolisthesis at C3-4 and anterolisthesis at C4-5 is stable. Chronic loss of disc height is again noted at C5-6 and C6-7 with right foraminal narrowing at C5-6. No focal lytic or blastic lesions are present. Other neck: A 10 mm left thyroid nodule is stable. No significant cervical adenopathy is present. No primary mucosal lesions are present. Salivary glands are within normal limits. Upper chest: Mild dependent atelectasis is present. No focal nodule, mass, or airspace  disease is present. Review of the MIP images confirms the above findings CTA HEAD FINDINGS Anterior circulation: Atherosclerotic calcifications are again seen within the cavernous internal carotid arteries bilaterally. There is no significant stenosis proximal to the ICA termini. The A1 and M1 segments are normal. MCA bifurcations are intact. There is some attenuation of distal branch vessels. ACA and MCA branch vessels are otherwise normal. The anterior communicating artery is patent. No aneurysms are present. Posterior circulation: The right vertebral artery is the dominant vessel. PICA origins are visualized and normal. The vertebrobasilar junction is normal. Both posterior cerebral arteries originate from the basilar tip. Basilar artery is unremarkable. Moderate right P2 segment stenosis is again seen. PCA branch vessels are otherwise stable. Venous sinuses: The dural sinuses are patent. Straight sinus deep cerebral veins are intact. Cortical veins are unremarkable. Anatomic variants: None Review of the MIP images confirms the above findings CT Brain Perfusion Findings: CBF (<30%) Volume: 035mPerfusion (Tmax>6.0s) volume: 42m442mismatch Volume: 42mL3mfarction Location:N/a IMPRESSION: 1. CT perfusion demonstrates no areas of core infarct or ischemic tissue at risk. 2. CTA demonstrates no emergent large vessel occlusion. 3. 60% stenosis of the left internal carotid artery is stable. 4. Atherosclerotic changes at the aortic arch and right carotid bifurcation are stable without significant stenoses. 5. Left subclavian artery atherosclerotic disease without significant stenosis. 6. Stable calcifications of  the cavernous internal carotid arteries bilaterally without significant stenoses. 7. Stable moderate right P2 segment stenosis. 8. Multilevel degenerative changes of the cervical spine are stable. These results were called by telephone at the time of interpretation on 07/06/2018 at 1:16 pm to Dr. Roderic Palau , who  verbally acknowledged these results. Electronically Signed   By: San Morelle M.D.   On: 07/06/2018 13:16   Mr Brain Wo Contrast  Result Date: 07/07/2018 CLINICAL DATA:  77 year old female code stroke presentation on 07/06/2018. Chronic right MCA infarct EXAM: MRI HEAD WITHOUT CONTRAST TECHNIQUE: Multiplanar, multiecho pulse sequences of the brain and surrounding structures were obtained without intravenous contrast. COMPARISON:  CTA head and neck, CT perfusion and noncontrast head CT 07/06/2018. [facility] brain MRI 05/19/2018 and earlier. FINDINGS: Brain: Persistent small area of trace diffusion abnormality in the left superior frontal gyrus subcortical white matter (series 5, image 60 and series 7, image 45), not significantly changed since August. Corresponding T2 and FLAIR hyperintensity has developed. No hemorrhage or mass effect. New punctate focus of restricted diffusion right inferior occipital lobe white matter on series 5, image 46. No hemorrhage or mass effect. No other convincing restricted diffusion. Stable middle right MCA territory encephalomalacia with hemosiderin. Stable small chronic infarcts in the cerebellum and left deep gray matter. Stable patchy T2 and FLAIR hyperintensity in the pons and scattered cerebral white matter. No midline shift, mass effect, evidence of mass lesion, ventriculomegaly, extra-axial collection or acute intracranial hemorrhage. Cervicomedullary junction and pituitary are within normal limits. Vascular: Major intracranial vascular flow voids are stable. Skull and upper cervical spine: Chronic C3-C4 disc and endplate degeneration. Visualized bone marrow signal is within normal limits. Sinuses/Orbits: Stable and negative. Other: Stable trace mastoid effusions. Negative nasopharynx. Scalp and face soft tissues are negative. IMPRESSION: 1. Punctate new white matter infarct in the anterior right occipital lobe. No hemorrhage or mass effect. 2. No other  acute intracranial abnormality. Evolution of the small left superior frontal gyrus lacune since August with persistent signal abnormality on trace diffusion. Chronic middle right MCA territory encephalomalacia. Chronic underlying small vessel disease. Electronically Signed   By: Genevie Ann M.D.   On: 07/07/2018 11:19   Ct Cerebral Perfusion W Contrast  Result Date: 07/06/2018 CLINICAL DATA:  Acute onset of left-sided weakness and slurred speech beginning 2 hours ago. EXAM: CT ANGIOGRAPHY HEAD AND NECK CT PERFUSION BRAIN TECHNIQUE: Multidetector CT imaging of the head and neck was performed using the standard protocol during bolus administration of intravenous contrast. Multiplanar CT image reconstructions and MIPs were obtained to evaluate the vascular anatomy. Carotid stenosis measurements (when applicable) are obtained utilizing NASCET criteria, using the distal internal carotid diameter as the denominator. Multiphase CT imaging of the brain was performed following IV bolus contrast injection. Subsequent parametric perfusion maps were calculated using RAPID software. CONTRAST:  291m ISOVUE-370 IOPAMIDOL (ISOVUE-370) INJECTION 76% COMPARISON:  CT without contrast 07/06/2018.  MRI brain 05/19/2018 FINDINGS: CTA NECK FINDINGS Aortic arch: A 3 vessel arch configuration is present. Atherosclerotic calcifications are present at the aortic arch and at the origins of the great vessels without significant stenosis relative to the more distal vessels. Calcifications along the proximal left subclavian artery are stable. Right carotid system: The right common carotid artery is within normal limits. Atherosclerotic calcifications are again seen at the right carotid bifurcation. There is no significant stenosis. There is mild tortuosity of the cervical right ICA without significant stenosis. Left carotid system: Left common carotid artery is tortuous proximally without significant  stenosis. The lumen is narrowed to 2 mm.  More distal left ICA measures 4.8 mm. No tandem stenosis is present. Vertebral arteries: Atherosclerotic calcifications are present at the origin of the vertebral arteries bilaterally. There is no significant stenosis at the origins. Mild narrowing is associated with tortuosity of the non dominant left vertebral artery. There is tortuosity of the vertebral arteries more distally without other focal stenoses in the neck. Skeleton: Degenerative retrolisthesis at C3-4 and anterolisthesis at C4-5 is stable. Chronic loss of disc height is again noted at C5-6 and C6-7 with right foraminal narrowing at C5-6. No focal lytic or blastic lesions are present. Other neck: A 10 mm left thyroid nodule is stable. No significant cervical adenopathy is present. No primary mucosal lesions are present. Salivary glands are within normal limits. Upper chest: Mild dependent atelectasis is present. No focal nodule, mass, or airspace disease is present. Review of the MIP images confirms the above findings CTA HEAD FINDINGS Anterior circulation: Atherosclerotic calcifications are again seen within the cavernous internal carotid arteries bilaterally. There is no significant stenosis proximal to the ICA termini. The A1 and M1 segments are normal. MCA bifurcations are intact. There is some attenuation of distal branch vessels. ACA and MCA branch vessels are otherwise normal. The anterior communicating artery is patent. No aneurysms are present. Posterior circulation: The right vertebral artery is the dominant vessel. PICA origins are visualized and normal. The vertebrobasilar junction is normal. Both posterior cerebral arteries originate from the basilar tip. Basilar artery is unremarkable. Moderate right P2 segment stenosis is again seen. PCA branch vessels are otherwise stable. Venous sinuses: The dural sinuses are patent. Straight sinus deep cerebral veins are intact. Cortical veins are unremarkable. Anatomic variants: None Review of the MIP  images confirms the above findings CT Brain Perfusion Findings: CBF (<30%) Volume: 78m Perfusion (Tmax>6.0s) volume: 058mMismatch Volume: 50m42mnfarction Location:N/a IMPRESSION: 1. CT perfusion demonstrates no areas of core infarct or ischemic tissue at risk. 2. CTA demonstrates no emergent large vessel occlusion. 3. 60% stenosis of the left internal carotid artery is stable. 4. Atherosclerotic changes at the aortic arch and right carotid bifurcation are stable without significant stenoses. 5. Left subclavian artery atherosclerotic disease without significant stenosis. 6. Stable calcifications of the cavernous internal carotid arteries bilaterally without significant stenoses. 7. Stable moderate right P2 segment stenosis. 8. Multilevel degenerative changes of the cervical spine are stable. These results were called by telephone at the time of interpretation on 07/06/2018 at 1:16 pm to Dr. ZAMRoderic Palauwho verbally acknowledged these results. Electronically Signed   By: ChrSan MorelleD.   On: 07/06/2018 13:16   Ct Head Code Stroke Wo Contrast  Result Date: 07/06/2018 CLINICAL DATA:  Code stroke. Acute onset of left-sided weakness today. Dizziness. EXAM: CT HEAD WITHOUT CONTRAST TECHNIQUE: Contiguous axial images were obtained from the base of the skull through the vertex without intravenous contrast. COMPARISON:  CT head without contrast 05/19/2018. MRI brain 05/19/2018. FINDINGS: Brain: Remote lacunar infarcts of the basal ganglia and internal capsule are stable. Remote right MCA territory encephalomalacia is stable. No acute infarct, hemorrhage, or mass lesion is present. The insular cortex is within normal limits bilaterally. No new cortical lesions are present. Remote lacunar infarcts are present in the cerebellum bilaterally. Brainstem is within normal limits. The ventricles are of normal size. No significant extraaxial fluid collection is present. Vascular: Dense atherosclerotic changes are present  within the cavernous internal carotid arteries bilaterally and at the dural margin of both vertebral  arteries. There is no significant hyperdense vessel. Skull: Calvarium is intact. No focal lytic or blastic lesions are present. Sinuses/Orbits: The paranasal sinuses and mastoid air cells are clear. Globes and orbits are within normal limits. ASPECTS [facility] Stroke Program Early CT Score) - Ganglionic level infarction (caudate, lentiform nuclei, internal capsule, insula, M1-M3 cortex): 7/7 - Supraganglionic infarction (M4-M6 cortex): 3/3 Total score (0-10 with 10 being normal): 10/10 IMPRESSION: 1. No acute intracranial abnormality. 2. Stable right MCA territory encephalomalacia without definite expansion of the infarct territory. 3. Remote lacunar infarcts of the basal ganglia and cerebellum bilaterally are stable. 4. Atherosclerosis. 5. ASPECTS is 10/10 These results were called by telephone at the time of interpretation on 07/06/2018 at 12:00 pm to Dr. Milton Ferguson , who verbally acknowledged these results. Electronically Signed   By: San Morelle M.D.   On: 07/06/2018 12:00    PHYSICAL EXAM Pleasant elderly lady currently not in distress. . Afebrile. Head is nontraumatic. Neck is supple without bruit.    Cardiac exam no murmur or gallop. Lungs are clear to auscultation. Distal pulses are well felt.  Neurological Exam :  Awake alert oriented x 3 normal speech and language. Mild left lower face asymmetry. Tongue midline. No drift.mild left hemiparesis 4/5 strength Mild diminished fine finger movements on left. Orbits right over left upper extremity. Mild left grip weak.. Normal sensation . Normal coordination.  ASSESSMENT/PLAN [name] is a 77 y.o. female with history of type 2 diabetes, stroke, seizures, hypercholesterolemia, coronary artery disease and carotid stenosis bilaterally presenting with left facial droop, dysarthria, weakness in the left side. Received tPA 07/06/18 at  1239.  Stroke:  Punctate R occipital infarct s/p IV tPA secondary to small vessel disease    Code Stroke CT head No acute stroke. R MCA encephalomalacia. Old BG and B cerebellar infarcts. Atherosclerosis. ASPECTS 10.     CTA head & neck no ELVO. L ICA 60% stenosis. Aortic arch, R ICA bifurcation, L subclavian atherosclerosis. R P2 stenosis. Multilevel degen spine changes.  CT perfusion no core infarct  MRI  Punctate R occipital infarct. Evolution of sm L superior frontal gyrus lacune since Aug 2019. Chronic R MCA encephalomalacia. Small vessel disease.   2D Echo  pending   LDL 50  HgbA1c 4.6  SCDs for VTE prophylaxis  aspirin 81 mg daily and clopidogrel 75 mg daily prior to admission, now on No antithrombotic given tPA. Will resume antiplatelets 24h after tPA administration  Therapy recommendations:  Pending. May be OOB  Disposition:  pending   Hypertension  Stable . BP goal normotensive  Hyperlipidemia  Home meds:  lipitor 80, resumed in hospital  LDL 50, goal < 70  Continue statin at discharge  Diabetes type II  HgbA1c 4.6, at goal < 7.0  Dysphagia  No hx  SLP recommend D3 nectar thick diet  Monitor  Seizure  On trileptal and keppra PTA  Hx seizure 1 yr ago felt to be d/t prior stroke, no weakness  08/2017 episode LOC and syncope. EEG neg  followed by Dr. Merlene Laughter  Check EEG  Other Stroke Risk Factors  Advanced age  Cigarette smoker, advised to stop smoking  Hx stroke/TIA  02/2017 - multifocal   punctate diffusion-positive lesions following seizures   in left precentral gyrus, left corona radiata, and right thalamus unclear whether postictal findings are small infarcts secondary to extensive atherosclerotic disease. New onset seizures felt to be from prior stroke. Placed on dilantin.  Coronary artery disease, coronary stent  Migraines  Obstructive sleep apnea, not on CPAP at home  Aortic insufficiency  Carotid stenosis  PSVT  Other  Active Problems  Anemia, Hgb 10.2  Thrombocytopenia, PLTs 135  Hypokalemia, K 3.3 - supplement - recheck  Renal insufficiency Cr 1.04  Hospital day # [place], MSN, APRN, ANVP-BC, AGPCNP-BC Advanced Practice Stroke Nurse [place] for Schedule & Pager information 07/07/2018 12:40 PM  I have personally examined this patient, reviewed notes, independently viewed imaging studies, participated in medical decision making and plan of care.ROS completed by me personally and pertinent positives fully documented  I have made any additions or clarifications directly to the above note. Agree with note above. She has presented with left-sided weakness due to small right subcortical lacunar infarct likely from small vessel disease. She had a similar lacunar infarct in August 2019 as well. He also has history of seizure disorder. Continue ongoing stroke workup. Strict blood pressure control and close neurological monitoring as per post TPA protocol. Continue antiplatelet therapy.This patient is critically ill and at significant risk of neurological worsening, death and care requires constant monitoring of vital signs, hemodynamics,respiratory and cardiac monitoring, extensive review of multiple databases, frequent neurological assessment, discussion with family, other specialists and medical decision making of high complexity.I have made any additions or clarifications directly to the above note.This critical care time does not reflect procedure time, or teaching time or supervisory time of PA/NP/Med Resident etc but could involve care discussion time.  I spent 30 minutes of neurocritical care time  in the care of  this patient.     Antony Contras, MD Medical Director [facility] Pager: [phone] 07/07/2018 6:00 PM  To contact Stroke Continuity provider, please refer to [email or web address] After hours, contact General Neurology

## 2018-07-08 DIAGNOSIS — I6300 Cerebral infarction due to thrombosis of unspecified precerebral artery: Secondary | ICD-10-CM

## 2018-07-08 LAB — CBC
HCT: 36.2 % (ref 36.0–46.0)
Hemoglobin: 12 g/dL (ref 12.0–15.0)
MCH: 31.1 pg (ref 26.0–34.0)
MCHC: 33.1 g/dL (ref 30.0–36.0)
MCV: 93.8 fL (ref 80.0–100.0)
NRBC: 0 % (ref 0.0–0.2)
PLATELETS: 132 10*3/uL — AB (ref 150–400)
RBC: 3.86 MIL/uL — AB (ref 3.87–5.11)
RDW: 13.8 % (ref 11.5–15.5)
WBC: 9.2 10*3/uL (ref 4.0–10.5)

## 2018-07-08 LAB — BASIC METABOLIC PANEL
Anion gap: 7 (ref 5–15)
BUN: <5 mg/dL — ABNORMAL LOW (ref 8–23)
CHLORIDE: 108 mmol/L (ref 98–111)
CO2: 25 mmol/L (ref 22–32)
CREATININE: 0.7 mg/dL (ref 0.44–1.00)
Calcium: 8.5 mg/dL — ABNORMAL LOW (ref 8.9–10.3)
GFR calc Af Amer: >60 mL/min (ref >60)
GFR calc non Af Amer: >60 mL/min (ref >60)
Glucose, Bld: 115 mg/dL — ABNORMAL HIGH (ref 70–99)
Potassium: 3.5 mmol/L (ref 3.5–5.1)
SODIUM: 140 mmol/L (ref 135–145)

## 2018-07-08 MED ORDER — METOCLOPRAMIDE HCL 5 MG/ML IJ SOLN
10.0000 mg | Freq: Once | INTRAMUSCULAR | Status: AC
  Administered 2018-07-08: 10 mg via INTRAVENOUS
  Filled 2018-07-08: qty 2

## 2018-07-08 MED ORDER — METOPROLOL TARTRATE 25 MG PO TABS
25.0000 mg | ORAL_TABLET | Freq: Once | ORAL | Status: AC
  Administered 2018-07-08: 25 mg via ORAL
  Filled 2018-07-08: qty 1

## 2018-07-08 NOTE — H&P (Signed)
Physical Medicine and Rehabilitation Admission H&P    Chief Complaint  Patient presents with  . Code Stroke  : HPI: [name] is a 77 year old right-handed female history of aortic insufficiency, PSVT, CAD with stenting maintained on aspirin and Plavix, hypertension, type 2 diabetes mellitus and seizure disorder maintained on Trileptal .  Per chart review and patient, patient lives alone.  2 level home with 2 steps to entry.  Independent with assistive device prior to admission.  She does have a wheelchair for longer distances.  Son lives next door.  Patient states that she plans to go home with her son who can provide assistance.  Presented 07/06/2018 with acute onset of left facial droop dysarthria and left-sided weakness.  Cranial CT scan reviewed, unremarkable for acute intracranial process.  Per report, remote lacunar infarcts of the basal ganglia and cerebellum bilaterally.  Patient did receive TPA.  CT angiogram of head and neck showed no emergent large vessel occlusion.  60% stenosis of left internal carotid artery.  MRI showed punctate new white matter infarction in the anterior right occipital lobe.  No hemorrhage or mass-effect.  Echocardiogram with ejection fraction of 01% grade 2 diastolic dysfunction.  EEG negative for seizure.  Presently remains on aspirin and Plavix for CVA prophylaxis.  Diet has been advanced to a regular consistency.  Therapy evaluations completed with recommendations of physical medicine rehab consult.  Patient was admitted for a comprehensive rehab program.  Review of Systems  Constitutional: Negative for chills and fever.  HENT: Negative for hearing loss.   Eyes: Negative for blurred vision and double vision.  Respiratory: Negative for cough and shortness of breath.   Cardiovascular: Positive for leg swelling. Negative for chest pain and palpitations.  Gastrointestinal: Positive for constipation. Negative for nausea and vomiting.       GERD    Genitourinary: Negative for dysuria and flank pain.  Musculoskeletal: Positive for back pain and myalgias.  Skin: Negative for rash.  Neurological: Positive for speech change, focal weakness and seizures.  All other systems reviewed and are negative.  Past Medical History:  Diagnosis Date  . Anemia   . Aortic insufficiency    Moderate  . Back pain   . Carotid stenosis, bilateral   . Cirrhosis ([place])   . Closed left femoral fracture ([place]) 04/21/2017  . Coronary artery disease    Stent x 2 RCA 1995, cardiac catheterization 09/2016 showing only mild atherosclerosis  . DDD (degenerative disc disease), lumbar   . Essential hypertension   . Falls   . GERD (gastroesophageal reflux disease)   . Hiatal hernia   . History of stroke   . Hypercholesteremia   . IBS (irritable bowel syndrome)   . Non-alcoholic fatty liver disease   . OSA (obstructive sleep apnea)    no CPAP  . Pericardial effusion    a. small by echo 2018.  Marland Kitchen PSVT (paroxysmal supraventricular tachycardia) ([place])   . Recurrent UTI   . Seizures ([place])   . Stroke ([place])   . Type 2 diabetes mellitus ([place])    off medication  . Varices, esophageal ([place])    Past Surgical History:  Procedure Laterality Date  . [place]  . APPENDECTOMY    . BACK SURGERY  1985  . CARDIAC CATHETERIZATION  [id number]   Stent to the proximal RCA after MI   . CARDIAC CATHETERIZATION N/A 09/26/2016   Procedure: Left Heart Cath and Coronary Angiography;  Surgeon: Leonie Man, MD;  Location: [facility];  Service: Cardiovascular;  Laterality: N/A;  . CATARACT EXTRACTION W/PHACO Right 07/04/2014   Procedure: CATARACT EXTRACTION PHACO AND INTRAOCULAR LENS PLACEMENT (IOC);  Surgeon: Elta Guadeloupe T. Gershon Crane, MD;  Location: [facility];  Service: Ophthalmology;  Laterality: Right;  CDE:13.13  . COLONOSCOPY    . DILATION AND CURETTAGE OF UTERUS     x2  . Epi Retinal Membrane Peel Left   . ERCP    . ESOPHAGEAL BANDING N/A 04/01/2013    Procedure: ESOPHAGEAL BANDING;  Surgeon: Rogene Houston, MD;  [facility];  Service: Endoscopy;  Laterality: N/A;  . ESOPHAGEAL BANDING N/A 05/24/2013   Procedure: ESOPHAGEAL BANDING;  Surgeon: Rogene Houston, MD;  [facility];  Service: Endoscopy;  Laterality: N/A;  . ESOPHAGEAL BANDING N/A 06/21/2014   Procedure: ESOPHAGEAL BANDING;  Surgeon: Rogene Houston, MD;  [facility];  Service: Endoscopy;  Laterality: N/A;  . ESOPHAGOGASTRODUODENOSCOPY N/A 04/01/2013   Procedure: ESOPHAGOGASTRODUODENOSCOPY (EGD);  Surgeon: Rogene Houston, MD;  [facility];  Service: Endoscopy;  Laterality: N/A;  230-rescheduled to 8:30am Ann notified pt  . ESOPHAGOGASTRODUODENOSCOPY N/A 05/24/2013   Procedure: ESOPHAGOGASTRODUODENOSCOPY (EGD);  Surgeon: Rogene Houston, MD;  [facility];  Service: Endoscopy;  Laterality: N/A;  730  . ESOPHAGOGASTRODUODENOSCOPY N/A 06/21/2014   Procedure: ESOPHAGOGASTRODUODENOSCOPY (EGD);  Surgeon: Rogene Houston, MD;  [facility];  Service: Endoscopy;  Laterality: N/A;  930-rescheduled 10/14 @ 1200 Ann to notify pt  . EYE SURGERY  08   cataract surgery of the left eye  . FEMUR IM NAIL Left 03/02/2017   Procedure: INTRAMEDULLARY (IM) NAIL FEMORAL;  Surgeon: Rod Can, MD;  Location: [facility];  Service: Orthopedics;  Laterality: Left;  . HARDWARE REMOVAL Right 01/17/2013   Procedure: REMOVAL OF HARDWARE AND EXCISION ULNAR STYLOID RIGHT WRIST;  Surgeon: Tennis Must, MD;  Location: [facility];  Service: Orthopedics;  Laterality: Right;  . MYRINGOTOMY  2012   both ears  . ORIF FEMUR FRACTURE Left 04/23/2017   Procedure: OPEN REDUCTION INTERNAL FIXATION (ORIF) DISTAL FEMUR FRACTURE (FRACTURE AROUND FEMORAL NAIL);  Surgeon: Altamese St. Francis, MD;  Location: [facility];  Service: Orthopedics;  Laterality: Left;  . TONSILLECTOMY    . VAGINAL HYSTERECTOMY  1972  . WRIST SURGERY     rt wrist hardwear removal    Family History  Problem Relation Age of Onset  . Diabetes Mother   . Heart failure Father   . Heart failure Maternal Aunt    Social History:  reports that she has been smoking cigarettes. She has a 12.50 pack-year smoking history. She has never used smokeless tobacco. She reports that she does not drink alcohol or use drugs. Allergies:  Allergies  Allergen Reactions  . Tape Rash and Other (See Comments)    Please use paper tape   Medications Prior to Admission  Medication Sig Dispense Refill  . aspirin EC 81 MG tablet Take 81 mg by mouth daily.    Marland Kitchen aspirin-acetaminophen-caffeine (EXCEDRIN MIGRAINE) 250-250-65 MG tablet Take 2 tablets by mouth as needed for headache.    Marland Kitchen atorvastatin (LIPITOR) 80 MG tablet Take 1 tablet (80 mg total) by mouth daily. 30 tablet 1  . carvedilol (COREG) 12.5 MG tablet Take 1 tablet (12.5 mg total) by mouth 2 (two) times daily with a meal. 60 tablet 1  . ciprofloxacin (CIPRO) 500 MG tablet Take 1 tablet by mouth 2 (two) times daily.    Marland Kitchen  clopidogrel (PLAVIX) 75 MG tablet Take 1 tablet (75 mg total) by mouth daily with breakfast. 30 tablet 0  . HYDROcodone-acetaminophen (NORCO) 10-325 MG tablet Take 1 tablet by mouth 4 (four) times daily as needed.    Marland Kitchen LORazepam (ATIVAN) 1 MG tablet Take 1 tablet by mouth daily as needed.    . Multiple Vitamins-Minerals (CENTRUM ADULTS) TABS Take 1 tablet by mouth daily.    . niacin (NIASPAN) 500 MG CR tablet Take 1 tablet by mouth daily.    Marland Kitchen omeprazole (PRILOSEC) 20 MG capsule Take 1 capsule (20 mg total) by mouth daily. 90 capsule 3  . ondansetron (ZOFRAN) 4 MG tablet Take 1 tablet (4 mg total) by mouth every 8 (eight) hours as needed for nausea or vomiting. 30 tablet 1  . Oxcarbazepine (TRILEPTAL) 300 MG tablet Take 1 tablet by mouth daily.    . ranitidine (ZANTAC) 150 MG tablet Take 1 tablet by mouth 2 (two) times daily as needed.    Marland Kitchen tiZANidine (ZANAFLEX) 4 MG tablet Take 1 tablet by mouth at bedtime.     .  levETIRAcetam (KEPPRA) 500 MG tablet Take 1 tablet (500 mg total) by mouth 2 (two) times daily. (Patient not taking: Reported on 07/06/2018) 60 tablet 0    Drug Regimen Review Drug regimen was reviewed and remains appropriate with no significant issues identified  Home: Home Living Family/patient expects to be discharged to:: Private residence Living Arrangements: Other relatives Available Help at Discharge: Family Type of Home: House Home Access: Stairs to enter Technical brewer of Steps: 2 Entrance Stairs-Rails: Left Home Layout: Multi-level, Two level Alternate Level Stairs-Number of Steps: 12-15 steps to basement (patient states she does not go to basement) Alternate Level Stairs-Rails: Right Bathroom Shower/Tub: Chiropodist: Standard Bathroom Accessibility: Yes Home Equipment: Environmental consultant - 2 wheels, Wheelchair - manual, [employer] - single point, Shower seat, Bedside commode, Grab bars - tub/shower   Functional History: Prior Function Level of Independence: Needs assistance Gait / Transfers Assistance Needed: Primarily uses RW per son ADL's / Homemaking Assistance Needed: pt able to complete ADL with S of family; pt cooking dinner last week Comments: household ambulator with RW, uses wheelchair for community distances  Functional Status:  Mobility: Bed Mobility Overal bed mobility: Needs Assistance Bed Mobility: Supine to Sit Supine to sit: Min assist General bed mobility comments: significant amount of time to come to EOB, cues for positioning required. min assist for scooting hips foward on EOB. Transfers Overall transfer level: Needs assistance Equipment used: Rolling walker (2 wheeled) Transfers: Sit to/from Stand Sit to Stand: Min assist, +2 safety/equipment General transfer comment: Min assist required to carry out functional movement of power up and transition. Pt required VC for proper hand placement when going sit to/from  stand. Ambulation/Gait Ambulation/Gait assistance: Min assist Gait Distance (Feet): 20 Feet Assistive device: Rolling walker (2 wheeled) Gait Pattern/deviations: Step-to pattern, Trunk flexed, Decreased step length - left, Decreased stance time - left, Narrow base of support General Gait Details: Pt required VC for gait pattern and increasing step length throughout gt training.  Pt c/o pain in L Hip but stated that "she doesn't think it will ever go away". Gait velocity: non-functional speed Gait velocity interpretation: <1.31 ft/sec, indicative of household ambulator    ADL: ADL Overall ADL's : Needs assistance/impaired Eating/Feeding: Minimal assistance Eating/Feeding Details (indicate cue type and reason): nectar thick Grooming: Supervision/safety, Set up, Sitting Upper Body Bathing: Minimal assistance, Sitting Lower Body Bathing: Moderate assistance, Sit to/from stand  Upper Body Dressing : Moderate assistance, Sitting Lower Body Dressing: Moderate assistance, Sit to/from stand Toilet Transfer: Minimal assistance Toileting- Clothing Manipulation and Hygiene: Moderate assistance, Sit to/from stand Functional mobility during ADLs: +2 for safety/equipment, Minimal assistance General ADL Comments: difficulty with initiation; very slow movements; requires physical assist to stand from chair(took @ 5 steps in 5 min)  Cognition: Cognition Overall Cognitive Status: Impaired/Different from baseline Arousal/Alertness: Awake/alert Orientation Level: Oriented X4 Attention: Selective Selective Attention: Appears intact Memory: Impaired Memory Impairment: Retrieval deficit, Prospective memory Awareness: Appears intact Executive Function: Reasoning Reasoning: Impaired Reasoning Impairment: Verbal complex Safety/Judgment: Appears intact Cognition Arousal/Alertness: Awake/alert Behavior During Therapy: WFL for tasks assessed/performed Overall Cognitive Status: Impaired/Different from  baseline Area of Impairment: Orientation, Attention, Memory, Following commands, Safety/judgement, Awareness, Problem solving Orientation Level: Person, Place, Time, Situation Current Attention Level: Sustained Memory: Decreased short-term memory Following Commands: Follows one step commands with increased time Safety/Judgement: Decreased awareness of deficits Awareness: Emergent Problem Solving: Slow processing, Decreased initiation, Requires verbal cues  Physical Exam: Blood pressure (!) 181/60, pulse 89, temperature 98.6 F (37 C), temperature source Oral, resp. rate 18, weight 62.2 kg, SpO2 95 %. Physical Exam  Vitals reviewed. Constitutional: She is oriented to person, place, and time. No distress.  HENT:  Head: Normocephalic.  Eyes: Pupils are equal, round, and reactive to light. EOM are normal.  Neck: Normal range of motion. No thyromegaly present.  Cardiovascular: Normal rate and regular rhythm. Exam reveals no friction rub.  No murmur heard. Respiratory: Effort normal and breath sounds normal. No respiratory distress. She has no wheezes.  GI: Soft. She exhibits no distension. There is no tenderness.  Neurological: She is alert and oriented to person, place, and time.  Alert.  Patient makes good eye contact with examiner.  Follows full commands.  Oriented to person, place and time. Full visual fields intact. Decreased FMC LUE, depth perception issues with activities to left. Strength 4-/5 prox to distal bilateral upper extremities. Good insight and awareness.   Skin: Skin is warm and dry. She is not diaphoretic.  Psychiatric: She has a normal mood and affect. Her behavior is normal.    Results for orders placed or performed during the hospital encounter of 07/06/18 (from the past 48 hour(s))  MRSA PCR Screening     Status: None   Collection Time: 07/06/18  4:22 PM  Result Value Ref Range   MRSA by PCR NEGATIVE NEGATIVE    Comment:        The GeneXpert MRSA Assay  (FDA approved for NASAL specimens only), is one component of a comprehensive MRSA colonization surveillance program. It is not intended to diagnose MRSA infection nor to guide or monitor treatment for MRSA infections. Performed at [facility], [place] [address], [place], [place] [address]   Hemoglobin A1c     Status: Abnormal   Collection Time: 07/07/18  4:32 AM  Result Value Ref Range   Hgb A1c MFr Bld 4.6 (L) 4.8 - 5.6 %    Comment: (NOTE) Pre diabetes:          5.7%-6.4% Diabetes:              >6.4% Glycemic control for   <7.0% adults with diabetes    Mean Plasma Glucose 85.32 mg/dL    Comment: Performed at [facility] [address], [place], [place] [address]  Lipid panel     Status: None   Collection Time: 07/07/18  4:32 AM  Result Value Ref  Range   Cholesterol 109 0 - 200 mg/dL   Triglycerides 50 <150 mg/dL   HDL 49 >40 mg/dL   Total CHOL/HDL Ratio 2.2 RATIO   VLDL 10 0 - 40 mg/dL   LDL Cholesterol 50 0 - 99 mg/dL    Comment:        Total Cholesterol/HDL:CHD Risk Coronary Heart Disease Risk Table                     Men   Women  1/2 Average Risk   3.4   3.3  Average Risk       5.0   4.4  2 X Average Risk   9.6   7.1  3 X Average Risk  23.4   11.0        Use the calculated Patient Ratio above and the CHD Risk Table to determine the patient's CHD Risk.        ATP III CLASSIFICATION (LDL):  <100     mg/dL   Optimal  100-129  mg/dL   Near or Above                    Optimal  130-159  mg/dL   Borderline  160-189  mg/dL   High  >190     mg/dL   Very High Performed at [facility] [address], [place], [place] [address]   Basic metabolic panel     Status: Abnormal   Collection Time: 07/08/18  4:38 AM  Result Value Ref Range   Sodium 140 135 - 145 mmol/L   Potassium 3.5 3.5 - 5.1 mmol/L   Chloride 108 98 - 111 mmol/L   CO2 25 22 - 32 mmol/L   Glucose, Bld 115 (H) 70 - 99 mg/dL   BUN <5 (L) 8 - 23 mg/dL   Creatinine, Ser 0.70 0.44 -  1.00 mg/dL   Calcium 8.5 (L) 8.9 - 10.3 mg/dL   GFR calc non Af Amer >60 >60 mL/min   GFR calc Af Amer >60 >60 mL/min    Comment: (NOTE) The eGFR has been calculated using the CKD EPI equation. This calculation has not been validated in all clinical situations. eGFR's persistently <60 mL/min signify possible Chronic Kidney Disease.    Anion gap 7 5 - 15    Comment: Performed at [facility] [address], [place] [address]  CBC     Status: Abnormal   Collection Time: 07/08/18  4:38 AM  Result Value Ref Range   WBC 9.2 4.0 - 10.5 K/uL   RBC 3.86 (L) 3.87 - 5.11 MIL/uL   Hemoglobin 12.0 12.0 - 15.0 g/dL   HCT 36.2 36.0 - 46.0 %   MCV 93.8 80.0 - 100.0 fL   MCH 31.1 26.0 - 34.0 pg   MCHC 33.1 30.0 - 36.0 g/dL   RDW 13.8 11.5 - 15.5 %   Platelets 132 (L) 150 - 400 K/uL   nRBC 0.0 0.0 - 0.2 %    Comment: Performed at [facility], [place] [address], [place],  [address]   Mr Brain 56 Contrast  Result Date: 07/07/2018 CLINICAL DATA:  77 year old female code stroke presentation on 07/06/2018. Chronic right MCA infarct EXAM: MRI HEAD WITHOUT CONTRAST TECHNIQUE: Multiplanar, multiecho pulse sequences of the brain and surrounding structures were obtained without intravenous contrast. COMPARISON:  CTA head and neck, CT perfusion and noncontrast head CT 07/06/2018. [facility] brain MRI  05/19/2018 and earlier. FINDINGS: Brain: Persistent small area of trace diffusion abnormality in the left superior frontal gyrus subcortical white matter (series 5, image 60 and series 7, image 45), not significantly changed since August. Corresponding T2 and FLAIR hyperintensity has developed. No hemorrhage or mass effect. New punctate focus of restricted diffusion right inferior occipital lobe white matter on series 5, image 46. No hemorrhage or mass effect. No other convincing restricted diffusion. Stable middle right MCA territory encephalomalacia with hemosiderin. Stable small  chronic infarcts in the cerebellum and left deep gray matter. Stable patchy T2 and FLAIR hyperintensity in the pons and scattered cerebral white matter. No midline shift, mass effect, evidence of mass lesion, ventriculomegaly, extra-axial collection or acute intracranial hemorrhage. Cervicomedullary junction and pituitary are within normal limits. Vascular: Major intracranial vascular flow voids are stable. Skull and upper cervical spine: Chronic C3-C4 disc and endplate degeneration. Visualized bone marrow signal is within normal limits. Sinuses/Orbits: Stable and negative. Other: Stable trace mastoid effusions. Negative nasopharynx. Scalp and face soft tissues are negative. IMPRESSION: 1. Punctate new white matter infarct in the anterior right occipital lobe. No hemorrhage or mass effect. 2. No other acute intracranial abnormality. Evolution of the small left superior frontal gyrus lacune since August with persistent signal abnormality on trace diffusion. Chronic middle right MCA territory encephalomalacia. Chronic underlying small vessel disease. Electronically Signed   By: Genevie Ann M.D.   On: 07/07/2018 11:19       Medical Problem List and Plan: 1.  Left-sided weakness and dysarthria secondary to anterior right occipital lobe infarction secondary to small vessel disease status post TPA  -admit to inpatient rehab 2.  DVT Prophylaxis/Anticoagulation: SCDs.  Monitor for any signs of DVT 3. Pain Management: Tylenol as needed 4. Mood: Provide emotional support.  Anxiety 1 mg daily as needed 5. Neuropsych: This patient is capable of making decisions on her own behalf. 6. Skin/Wound Care: Routine skin checks 7. Fluids/Electrolytes/Nutrition: Routine in and outs with follow-up chemistries ordered 8.  Seizure disorder.  Continue Trileptal 300 mg twice daily 9.  CAD with stenting/PSVT.  No chest pain.  Continue aspirin and Plavix 10.  Type 2 diabetes mellitus.  Hemoglobin A1c 4.6.  Blood sugar checks  discontinued 11.  Hyperlipidemia.  Lipitor  Post Admission Physician Evaluation: 1. Functional deficits secondary  to right occipital lobe infarction. 2. Patient is admitted to receive collaborative, interdisciplinary care between the physiatrist, rehab nursing staff, and therapy team. 3. Patient's level of medical complexity and substantial therapy needs in context of that medical necessity cannot be provided at a lesser intensity of care such as a SNF. 4. Patient has experienced substantial functional loss from his/her baseline which was documented above under the "Functional History" and "Functional Status" headings.  Judging by the patient's diagnosis, physical exam, and functional history, the patient has potential for functional progress which will result in measurable gains while on inpatient rehab.  These gains will be of substantial and practical use upon discharge  in facilitating mobility and self-care at the household level. 5. Physiatrist will provide 24 hour management of medical needs as well as oversight of the therapy plan/treatment and provide guidance as appropriate regarding the interaction of the two. 6. The Preadmission Screening has been reviewed and patient status is unchanged unless otherwise stated above. 7. 24 hour rehab nursing will assist with bladder management, bowel management, safety, skin/wound care, disease management, medication administration, pain management and patient education  and help integrate therapy concepts, techniques,education, etc. 8. PT  will assess and treat for/with: Lower extremity strength, range of motion, stamina, balance, functional mobility, safety, adaptive techniques and equipment, NMR, visual-spatial awareness, community reentry.   Goals are: supervision to mod I. 9. OT will assess and treat for/with: ADL's, functional mobility, safety, upper extremity strength, adaptive techniques and equipment, NMR, visual-spatial awareness, community  reentry.   Goals are: supervision to mod I. Therapy may proceed with showering this patient. 10. SLP will assess and treat for/with: n/a.  Goals are: n/a. 11. Case Management and Social Worker will assess and treat for psychological issues and discharge planning. 12. Team conference will be held weekly to assess progress toward goals and to determine barriers to discharge. 13. Patient will receive at least 3 hours of therapy per day at least 5 days per week. 14. ELOS: 4-7 days       15. Prognosis:  excellent   I have personally performed a face to face diagnostic evaluation of this patient and formulated the key components of the plan.  Additionally, I have personally reviewed laboratory data, imaging studies, as well as relevant notes and concur with the physician assistant's documentation above.  Meredith Staggers, MD, FAAPMR    Lavon Paganini [place], PA-C 07/08/2018

## 2018-07-08 NOTE — Progress Notes (Signed)
  Speech Language Pathology Treatment: Dysphagia  Patient Details Name: [name] MRN: [id number] DOB: 1940-10-09 Today's Date: 07/08/2018 Time: 8979-1504 SLP Time Calculation (min) (ACUTE ONLY): 8 min  Assessment / Plan / Recommendation Clinical Impression  Pt presents with resolved dysphagia: she demonstrates improved ability to tolerate thin liquids, with no overt s/s of aspiration, and is able to consume thin liquids/regular solids in conjunction without concern for aspiration or dysphagia. Lung sounds are clear.  Will advance diet to regular solids, thin liquids.  No further f/u for swallowing is warranted.    HPI HPI:  77 y.o. female with history of type 2 diabetes, stroke, seizures, hypercholesterolemia, coronary artery disease, GERD, carotid stenosis admitted with sudden onset dysarthria, left hemiparesis, left facial asymmetry.  tPA administered. CCT: no acute intracranial abnormality--lacunar infarcts of the basal ganglia and cerebellum bilaterally. MRI: Punctate new white matter infarct in the anterior right occipital lobe.     SLP Plan  All goals met  All further Speech Lanaguage Pathology  needs can be addressed in the next venue of care    Recommendations  Diet recommendations: Regular;Thin liquid Liquids provided via: Cup;Straw Medication Administration: Whole meds with liquid Supervision: Patient able to self feed                Follow up Recommendations: Inpatient Rehab SLP Visit Diagnosis: Dysphagia, unspecified (R13.10) Plan: All goals met       GO              [name] L. Tivis Ringer, [place] Office number [phone] Pager [phone]   [name] 07/08/2018, 2:21 PM

## 2018-07-08 NOTE — Progress Notes (Signed)
STROKE TEAM PROGRESS NOTE     INTERVAL HISTORY No family is at the bedside.  States she feels back to normal. Patient states he would want to go home but therapy recommendations inpatient rehabilitation.MRI scan of the brain yesterday showed punctate new white matter infarct in the anterior right occipital lobe. No hemorrhage or mass effect  Vitals:   07/08/18 0145 07/08/18 0325 07/08/18 0743 07/08/18 1133  BP: (!) 190/67 (!) 190/65 (!) 188/68 (!) 181/60  Pulse:  84 88 89  Resp:  18 18 18   Temp:  98.7 F (37.1 C) 99 F (37.2 C) 98.6 F (37 C)  TempSrc:  Oral Oral Oral  SpO2:  92% 95%   Weight:        CBC:  Recent Labs  Lab 07/06/18 1134 07/08/18 0438  WBC 6.4 9.2  NEUTROABS 3.4  --   HGB 10.8*  10.2* 12.0  HCT 32.8*  30.0* 36.2  MCV 96.5 93.8  PLT 135* 132*    Basic Metabolic Panel:  Recent Labs  Lab 07/06/18 1134 07/08/18 0438  NA 140  141 140  K 3.3*  3.4* 3.5  CL 107  103 108  CO2 27 25  GLUCOSE 192*  188* 115*  BUN 13  11 <5*  CREATININE 1.04*  1.10* 0.70  CALCIUM 9.0 8.5*   Lipid Panel:     Component Value Date/Time   CHOL 109 07/07/2018 0432   CHOL 142 12/07/2013 1038   TRIG 50 07/07/2018 0432   TRIG 98 12/07/2013 1038   HDL 49 07/07/2018 0432   HDL 47 12/07/2013 1038   CHOLHDL 2.2 07/07/2018 0432   VLDL 10 07/07/2018 0432   LDLCALC 50 07/07/2018 0432   LDLCALC 83 09/04/2017 1023   LDLCALC 75 12/07/2013 1038   HgbA1c:  Lab Results  Component Value Date   HGBA1C 4.6 (L) 07/07/2018   Urine Drug Screen:     Component Value Date/Time   LABOPIA NONE DETECTED 05/19/2018 1425   COCAINSCRNUR NONE DETECTED 05/19/2018 1425   LABBENZ POSITIVE (A) 05/19/2018 1425   AMPHETMU NONE DETECTED 05/19/2018 1425   THCU NONE DETECTED 05/19/2018 1425   LABBARB NONE DETECTED 05/19/2018 1425    Alcohol Level     Component Value Date/Time   ETH <10 07/06/2018 1134    IMAGING Mr Brain Wo Contrast  Result Date: 07/07/2018 CLINICAL DATA:   77 year old female code stroke presentation on 07/06/2018. Chronic right MCA infarct EXAM: MRI HEAD WITHOUT CONTRAST TECHNIQUE: Multiplanar, multiecho pulse sequences of the brain and surrounding structures were obtained without intravenous contrast. COMPARISON:  CTA head and neck, CT perfusion and noncontrast head CT 07/06/2018. [facility] brain MRI 05/19/2018 and earlier. FINDINGS: Brain: Persistent small area of trace diffusion abnormality in the left superior frontal gyrus subcortical white matter (series 5, image 60 and series 7, image 45), not significantly changed since August. Corresponding T2 and FLAIR hyperintensity has developed. No hemorrhage or mass effect. New punctate focus of restricted diffusion right inferior occipital lobe white matter on series 5, image 46. No hemorrhage or mass effect. No other convincing restricted diffusion. Stable middle right MCA territory encephalomalacia with hemosiderin. Stable small chronic infarcts in the cerebellum and left deep gray matter. Stable patchy T2 and FLAIR hyperintensity in the pons and scattered cerebral white matter. No midline shift, mass effect, evidence of mass lesion, ventriculomegaly, extra-axial collection or acute intracranial hemorrhage. Cervicomedullary junction and pituitary are within normal limits. Vascular: Major intracranial vascular flow voids are stable. Skull and  upper cervical spine: Chronic C3-C4 disc and endplate degeneration. Visualized bone marrow signal is within normal limits. Sinuses/Orbits: Stable and negative. Other: Stable trace mastoid effusions. Negative nasopharynx. Scalp and face soft tissues are negative. IMPRESSION: 1. Punctate new white matter infarct in the anterior right occipital lobe. No hemorrhage or mass effect. 2. No other acute intracranial abnormality. Evolution of the small left superior frontal gyrus lacune since August with persistent signal abnormality on trace diffusion. Chronic middle right MCA  territory encephalomalacia. Chronic underlying small vessel disease. Electronically Signed   By: Barbara Ann M.D.   On: 07/07/2018 11:19    PHYSICAL EXAM Pleasant elderly lady currently not in distress. . Afebrile. Head is nontraumatic. Neck is supple without bruit.    Cardiac exam no murmur or gallop. Lungs are clear to auscultation. Distal pulses are well felt.  Neurological Exam :  Awake alert oriented x 3 normal speech and language. Mild left lower face asymmetry. Tongue midline. No drift.mild left hemiparesis 4/5 strength Mild diminished fine finger movements on left. Orbits right over left upper extremity. Mild left grip weak.. Normal sensation . Normal coordination.  ASSESSMENT/PLAN [name] is a 77 y.o. female with history of type 2 diabetes, stroke, seizures, hypercholesterolemia, coronary artery disease and carotid stenosis bilaterally presenting with left facial droop, dysarthria, weakness in the left side. Received tPA 07/06/18 at 1239.  Stroke:  Punctate R occipital infarct s/p IV tPA secondary to small vessel disease    Code Stroke CT head No acute stroke. R MCA encephalomalacia. Old BG and B cerebellar infarcts. Atherosclerosis. ASPECTS 10.     CTA head & neck no ELVO. L ICA 60% stenosis. Aortic arch, R ICA bifurcation, L subclavian atherosclerosis. R P2 stenosis. Multilevel degen spine changes.  CT perfusion no core infarct  MRI  Punctate R occipital infarct. Evolution of sm L superior frontal gyrus lacune since Aug 2019. Chronic R MCA encephalomalacia. Small vessel disease.  2D Echo  Left ventricle: The cavity size was normal. Wall thickness was  increased in a pattern of moderate LVH. Systolic function was vigorous. The estimated ejection fraction was in the range of 65% to 70% HgbA1c 4.6  SCDs for VTE prophylaxis  aspirin 81 mg daily and clopidogrel 75 mg daily prior to admission, now on No antithrombotic given tPA. Will resume antiplatelets 24h after tPA  administration  Therapy recommendations:  Pending. May be OOB  Disposition:  pending   Hypertension  Stable . BP goal normotensive  Hyperlipidemia  Home meds:  lipitor 80, resumed in hospital  LDL 50, goal < 70  Continue statin at discharge  Diabetes type II  HgbA1c 4.6, at goal < 7.0  Dysphagia  No hx  SLP recommend D3 nectar thick diet  Monitor  Seizure  On trileptal and keppra PTA  Hx seizure 1 yr ago felt to be d/t prior stroke, no weakness  08/2017 episode LOC and syncope. EEG neg  followed by Dr. Merlene Laughter  Check EEG  Other Stroke Risk Factors  Advanced age  Cigarette smoker, advised to stop smoking  Hx stroke/TIA  02/2017 - multifocal   punctate diffusion-positive lesions following seizures   in left precentral gyrus, left corona radiata, and right thalamus unclear whether postictal findings are small infarcts secondary to extensive atherosclerotic disease. New onset seizures felt to be from prior stroke. Placed on dilantin.  Coronary artery disease, coronary stent  Migraines  Obstructive sleep apnea, not on CPAP at home  Aortic insufficiency  Carotid stenosis  PSVT  Other Active Problems  Anemia, Hgb 10.2  Thrombocytopenia, PLTs 135  Hypokalemia, K 3.3 - supplement - recheck  Renal insufficiency Cr 1.04  Hospital day # 2     She has presented with left-sided weakness due to small right subcortical lacunar infarct likely from small vessel disease. She had a similar lacunar infarct in August 2019 as well. He also has history of seizure disorder.  Continue antiplatelet therapy. Mobilize out of bed therapy and rehabilitation consults. Transfer to inpatient rehabilitation over the next few days after insurance approval and bed availability.    Antony Contras, MD Medical Director [facility] Pager: [phone] 07/08/2018 3:02 PM  To contact Stroke Continuity provider, please refer to [email or web address] After hours, contact  General Neurology

## 2018-07-08 NOTE — H&P (Deleted)
  The note originally documented on this encounter has been moved the the encounter in which it belongs.  

## 2018-07-08 NOTE — Evaluation (Signed)
Speech Language Pathology Evaluation Patient Details Name: [name] MRN: [id number] DOB: 18-Jan-1941 Today's Date: 07/08/2018 Time: 9518-8416 SLP Time Calculation (min) (ACUTE ONLY): 11 min  Problem List:  Patient Active Problem List   Diagnosis Date Noted  . Benign essential HTN   . Diabetes mellitus type 2 in nonobese (HCC)   . Seizures ([place])   . Tachypnea   . Dysphagia, post-stroke   . Stroke ([place]) 07/06/2018  . Dysarthria 05/19/2018  . Acute CVA (cerebrovascular accident) ([place]) 05/07/2018  . Syncope and collapse   . Syncope 08/24/2017  . Closed left femoral fracture ([place]) 04/21/2017  . Seizure disorder ([place]) 04/21/2017  . Closed pertrochanteric fracture of femur, left, initial encounter ([place]) 03/02/2017  . Seizure ([place]) 02/28/2017  . Hip fracture ([place]) 02/28/2017  . Chest pain 12/07/2016  . Atypical chest pain 12/07/2016  . Tachycardia   . NSTEMI (non-ST elevated myocardial infarction) ([place]) 11/02/2016  . Multifocal atrial tachycardia ([place]) 09/25/2016  . Elevated troponin 09/24/2016  . Hypokalemia 09/23/2016  . TIA (transient ischemic attack) 05/05/2016  . Intractable nausea and vomiting 10/15/2015  . Acute coronary syndrome ([place]) 06/01/2015  . Bronchitis 06/01/2015  . Thrombocytopenia ([place]) 06/01/2015  . Cerebral infarction ([place]) 03/06/2014  . Lower urinary tract infectious disease   . PSVT (paroxysmal supraventricular tachycardia) ([place]) 12/09/2013  . Esophageal varices ([place]) 03/29/2013  . Hematemesis 03/29/2013  . Hepatic cirrhosis ([place]) 03/29/2013  . Presence of stent in right coronary artery 07/04/2011  . OSA (obstructive sleep apnea) 07/04/2011  . Aortic sclerosis 07/04/2011  . Aortic insufficiency 07/04/2011  . HTN (hypertension) 07/04/2011  . Carotid stenosis, bilateral 07/04/2011  . Chest pain at rest 07/03/2011  . CAD (coronary artery disease) 07/03/2011  . DM type 2 (diabetes mellitus, type 2) ([place]) 07/03/2011  . Hypercholesteremia 07/03/2011  .  GERD (gastroesophageal reflux disease) 07/03/2011   Past Medical History:  Past Medical History:  Diagnosis Date  . Anemia   . Aortic insufficiency    Moderate  . Back pain   . Carotid stenosis, bilateral   . Cirrhosis ([place])   . Closed left femoral fracture ([place]) 04/21/2017  . Coronary artery disease    Stent x 2 RCA 1995, cardiac catheterization 09/2016 showing only mild atherosclerosis  . DDD (degenerative disc disease), lumbar   . Essential hypertension   . Falls   . GERD (gastroesophageal reflux disease)   . Hiatal hernia   . History of stroke   . Hypercholesteremia   . IBS (irritable bowel syndrome)   . Non-alcoholic fatty liver disease   . OSA (obstructive sleep apnea)    no CPAP  . Pericardial effusion    a. small by echo 2018.  Marland Kitchen PSVT (paroxysmal supraventricular tachycardia) ([place])   . Recurrent UTI   . Seizures ([place])   . Stroke ([place])   . Type 2 diabetes mellitus ([place])    off medication  . Varices, esophageal (HCC)    Past Surgical History:  Past Surgical History:  Procedure Laterality Date  . [place]  . APPENDECTOMY    . BACK SURGERY  1985  . CARDIAC CATHETERIZATION  [id number]   Stent to the proximal RCA after MI   . CARDIAC CATHETERIZATION N/A 09/26/2016   Procedure: Left Heart Cath and Coronary Angiography;  Surgeon: Leonie Man, MD;  Location: [place] CV LAB;  Service: Cardiovascular;  Laterality: N/A;  . CATARACT EXTRACTION W/PHACO Right 07/04/2014   Procedure: CATARACT EXTRACTION PHACO AND INTRAOCULAR LENS PLACEMENT (IOC);  Surgeon: Elta Guadeloupe T. Gershon Crane, MD;  Location: [facility];  Service: Ophthalmology;  Laterality: Right;  CDE:13.13  . COLONOSCOPY    . DILATION AND CURETTAGE OF UTERUS     x2  . Epi Retinal Membrane Peel Left   . ERCP    . ESOPHAGEAL BANDING N/A 04/01/2013   Procedure: ESOPHAGEAL BANDING;  Surgeon: Rogene Houston, MD;  Location: [facility];  Service: Endoscopy;  Laterality: N/A;  . ESOPHAGEAL BANDING N/A  05/24/2013   Procedure: ESOPHAGEAL BANDING;  Surgeon: Rogene Houston, MD;  [facility];  Service: Endoscopy;  Laterality: N/A;  . ESOPHAGEAL BANDING N/A 06/21/2014   Procedure: ESOPHAGEAL BANDING;  Surgeon: Rogene Houston, MD;  [facility];  Service: Endoscopy;  Laterality: N/A;  . ESOPHAGOGASTRODUODENOSCOPY N/A 04/01/2013   Procedure: ESOPHAGOGASTRODUODENOSCOPY (EGD);  Surgeon: Rogene Houston, MD;  [facility];  Service: Endoscopy;  Laterality: N/A;  230-rescheduled to 8:30am Ann notified pt  . ESOPHAGOGASTRODUODENOSCOPY N/A 05/24/2013   Procedure: ESOPHAGOGASTRODUODENOSCOPY (EGD);  Surgeon: Rogene Houston, MD;  [facility];  Service: Endoscopy;  Laterality: N/A;  730  . ESOPHAGOGASTRODUODENOSCOPY N/A 06/21/2014   Procedure: ESOPHAGOGASTRODUODENOSCOPY (EGD);  Surgeon: Rogene Houston, MD;  [facility];  Service: Endoscopy;  Laterality: N/A;  930-rescheduled 10/14 @ 1200 Ann to notify pt  . EYE SURGERY  08   cataract surgery of the left eye  . FEMUR IM NAIL Left 03/02/2017   Procedure: INTRAMEDULLARY (IM) NAIL FEMORAL;  Surgeon: Rod Can, MD;  Location: [facility];  Service: Orthopedics;  Laterality: Left;  . HARDWARE REMOVAL Right 01/17/2013   Procedure: REMOVAL OF HARDWARE AND EXCISION ULNAR STYLOID RIGHT WRIST;  Surgeon: Tennis Must, MD;  Location: [facility];  Service: Orthopedics;  Laterality: Right;  . MYRINGOTOMY  2012   both ears  . ORIF FEMUR FRACTURE Left 04/23/2017   Procedure: OPEN REDUCTION INTERNAL FIXATION (ORIF) DISTAL FEMUR FRACTURE (FRACTURE AROUND FEMORAL NAIL);  Surgeon: Altamese Erie, MD;  Location: [facility];  Service: Orthopedics;  Laterality: Left;  . TONSILLECTOMY    . VAGINAL HYSTERECTOMY  1972  . WRIST SURGERY     rt wrist hardwear removal   HPI:   77 y.o. female with history of type 2 diabetes, stroke, seizures, hypercholesterolemia, coronary artery disease, GERD, carotid stenosis admitted  with sudden onset dysarthria, left hemiparesis, left facial asymmetry.  tPA administered. CCT: no acute intracranial abnormality--lacunar infarcts of the basal ganglia and cerebellum bilaterally. Pt with recent admission August 2019.    Assessment / Plan / Recommendation Clinical Impression  Pt presents with mild cognitive changes s/p CVA, characterized by mild deficits in working and prospective memory, executive functioning.  Pt's speech is clear; output is fluent; expressive and receptive language are WNL.  No acute SLP needs are identified, but pt would benefit from deeper assessment on CIR.      SLP Assessment  SLP Recommendation/Assessment: All further Speech Lanaguage Pathology  needs can be addressed in the next venue of care SLP Visit Diagnosis: Cognitive communication deficit (R41.841)    Follow Up Recommendations  Inpatient Rehab    Frequency and Duration           SLP Evaluation Cognition  Overall Cognitive Status: Impaired/Different from baseline Arousal/Alertness: Awake/alert Orientation Level: Oriented X4 Attention: Selective Selective Attention: Appears intact Memory: Impaired Memory Impairment: Retrieval deficit;Prospective memory Awareness: Appears intact Executive Function: Reasoning Reasoning: Impaired Reasoning Impairment: Verbal complex Safety/Judgment: Appears intact  Comprehension  Auditory Comprehension Overall Auditory Comprehension: Appears within functional limits for tasks assessed    Expression Expression Primary Mode of Expression: Verbal Verbal Expression Overall Verbal Expression: Appears within functional limits for tasks assessed   Oral / Motor  Oral Motor/Sensory Function Overall Oral Motor/Sensory Function: Mild impairment Facial ROM: Suspected CN VII (facial) dysfunction;Reduced left Facial Symmetry: Abnormal symmetry left;Suspected CN VII (facial) dysfunction Lingual Symmetry: Within Functional Limits Motor Speech Overall  Motor Speech: Appears within functional limits for tasks assessed   GO                    [name] 07/08/2018, 2:17 PM  Shamarion Coots L. Tivis Ringer, [place] Office number [phone] Pager [phone]

## 2018-07-08 NOTE — Progress Notes (Addendum)
Inpatient Rehabilitation Admissions Coordinator  I have received a call from RN CM that pt now in agreement to CIR admit and son also wishes to pursue admit. I will begin insurance authorization. I will follow up tomorrow.  Danne Baxter, RN, MSN Rehab Admissions Coordinator [phone] 07/08/2018 1:33 PM

## 2018-07-08 NOTE — Progress Notes (Signed)
Physical Therapy Treatment Patient Details Name: [name] MRN: [id number] DOB: 1941-05-18 Today's Date: 07/08/2018    History of Present Illness  77 y.o. female with history of type 2 diabetes, stroke, seizures, hypercholesterolemia, coronary artery disease and carotid stenosis bilaterally presenting with left facial droop, dysarthria, weakness in the left side. Received tPA 07/06/18 at 1239. MRI revealed Punctate R occipital infarct     PT Comments    Pt was pleasant and agreed to PT. Pt ambulated 20 ft this session with a RW. She had a slow, step to gt pattern and required VC for safety and min A to maintain balance. She c/o L hip pain during gt, but stated it was "pain that will never go away after her hip surgery". Continue  to strengthen LE's, assess standing balance and gait training. Pt would benefit from cont's PT in order to progress toward goals and d/c to CIR.    Follow Up Recommendations  CIR     Equipment Recommendations  None recommended by PT    Recommendations for Other Services Rehab consult     Precautions / Restrictions Precautions Precautions: Fall    Mobility  Bed Mobility Overal bed mobility: Needs Assistance Bed Mobility: Supine to Sit     Supine to sit: Min assist     General bed mobility comments: significant amount of time to come to EOB, cues for positioning required. min assist for scooting hips foward on EOB.  Transfers Overall transfer level: Needs assistance Equipment used: Rolling walker (2 wheeled) Transfers: Sit to/from Stand Sit to Stand: Min assist;+2 safety/equipment         General transfer comment: Min assist required to carry out functional movement of power up and transition. Pt required VC for proper hand placement when going sit to/from stand.  Ambulation/Gait Ambulation/Gait assistance: Min assist;+2 safety/equipment Gait Distance (Feet): 20 Feet Assistive device: Rolling walker (2 wheeled) Gait Pattern/deviations:  Step-to pattern;Trunk flexed;Decreased step length - left;Decreased stance time - left;Narrow base of support     General Gait Details: Pt required VC for gait pattern and increasing step length throughout gt training. Requried close chair follow. Pt c/o pain in L Hip but stated that "she doesn't think it will ever go away".   Stairs             Wheelchair Mobility    Modified Rankin (Stroke Patients Only) Modified Rankin (Stroke Patients Only) Pre-Morbid Rankin Score: Slight disability Modified Rankin: Moderately severe disability     Balance Overall balance assessment: Needs assistance   Sitting balance-Leahy Scale: Fair Sitting balance - Comments: While on EOB therapist had pt reach outside BOS and touch target, pt requiring Min guard   Standing balance support: During functional activity Standing balance-Leahy Scale: Poor                              Cognition Arousal/Alertness: Awake/alert Behavior During Therapy: WFL for tasks assessed/performed Overall Cognitive Status: Impaired/Different from baseline Area of Impairment: Orientation;Attention;Memory;Following commands;Safety/judgement;Awareness;Problem solving                 Orientation Level: Person;Place;Time;Situation Current Attention Level: Sustained Memory: Decreased short-term memory Following Commands: Follows one step commands with increased time Safety/Judgement: Decreased awareness of deficits Awareness: Emergent Problem Solving: Slow processing;Decreased initiation;Requires verbal cues        Exercises Total Joint Exercises Long Arc Quad: AROM;Both;10 reps Marching in Standing: AROM;Both;10 reps    General Comments  Pertinent Vitals/Pain Pain Assessment: No/denies pain Faces Pain Scale: Hurts a little bit Pain Location: left hip Pain Descriptors / Indicators: Aching;Discomfort Pain Intervention(s): Limited activity within patient's tolerance;Monitored during  session    Home Living     Available Help at Discharge: Family Type of Home: House              Prior Function            PT Goals (current goals can now be found in the care plan section) Acute Rehab PT Goals Patient Stated Goal: to go home PT Goal Formulation: With patient Time For Goal Achievement: 07/21/18 Potential to Achieve Goals: Good Progress towards PT goals: Progressing toward goals    Frequency    Min 4X/week      PT Plan Current plan remains appropriate    Co-evaluation              AM-PAC PT "6 Clicks" Daily Activity  Outcome Measure  Difficulty turning over in bed (including adjusting bedclothes, sheets and blankets)?: A Lot Difficulty moving from lying on back to sitting on the side of the bed? : A Lot Difficulty sitting down on and standing up from a chair with arms (e.g., wheelchair, bedside commode, etc,.)?: A Lot Help needed moving to and from a bed to chair (including a wheelchair)?: A Little Help needed walking in hospital room?: A Little Help needed climbing 3-5 steps with a railing? : A Lot 6 Click Score: 14    End of Session Equipment Utilized During Treatment: Gait belt Activity Tolerance: Patient tolerated treatment well Patient left: in chair;with call bell/phone within reach;with chair alarm set Nurse Communication: Mobility status PT Visit Diagnosis: Muscle weakness (generalized) (M62.81);Difficulty in walking, not elsewhere classified (R26.2)     Time: 1359-1420 PT Time Calculation (min) (ACUTE ONLY): 21 min  Charges:  $Gait Training: 8-22 mins                    [address] SPTA   [place] 07/08/2018, 4:06 PM

## 2018-07-08 NOTE — Care Management Note (Addendum)
Case Management Note  Patient Details  Name: [name] [name] MRN: [id number] Date of Birth: [birth year]  Subjective/Objective:   Pt admitted with a stroke. She is from home with her 2 granddaughter: 23, 70. She states they are able to assist with ADL.  She has: walker and wheelchair at home. She states she was using wheelchair some for mobility prior to admission. Pt denies issues obtaining her medications.  Pt states her son provides transportation.                 Action/Plan: Recommendations are for CIR. Pt currently refusing and wanting to go home. CM spoke to patient's son, [name] [name] who lives across the street and he is going to talk the choices over with his mother. CM following.  Addendum: received phone call from [name] [name] that his mother would like to participate with CIR before returning home. [name] with CIR updated.  Expected Discharge Date:  07/08/18               Expected Discharge Plan:  [place]  In-House Referral:     Discharge planning Services  CM Consult  Post Acute Care Choice:    Choice offered to:     DME Arranged:    DME Agency:     HH Arranged:    HH Agency:     Status of Service:  In process, will continue to follow  If discussed at Long Length of Stay Meetings, dates discussed:    Additional Comments:  Pollie Friar, RN 07/08/2018, 1:13 PM

## 2018-07-09 ENCOUNTER — Other Ambulatory Visit: Payer: Self-pay

## 2018-07-09 ENCOUNTER — Encounter (HOSPITAL_COMMUNITY): Payer: Self-pay | Admitting: *Deleted

## 2018-07-09 ENCOUNTER — Inpatient Hospital Stay (HOSPITAL_COMMUNITY)
Admission: RE | Admit: 2018-07-09 | Discharge: 2018-07-15 | DRG: 057 | Disposition: A | Discharge status: Home Health | Payer: Medicare Other | Source: Intra-hospital | Attending: Physical Medicine & Rehabilitation | Admitting: Physical Medicine & Rehabilitation

## 2018-07-09 DIAGNOSIS — K5900 Constipation, unspecified: Secondary | ICD-10-CM | POA: Diagnosis present

## 2018-07-09 DIAGNOSIS — I739 Peripheral vascular disease, unspecified: Secondary | ICD-10-CM | POA: Diagnosis present

## 2018-07-09 DIAGNOSIS — R29700 NIHSS score 0: Secondary | ICD-10-CM | POA: Diagnosis not present

## 2018-07-09 DIAGNOSIS — Z833 Family history of diabetes mellitus: Secondary | ICD-10-CM | POA: Diagnosis not present

## 2018-07-09 DIAGNOSIS — Z955 Presence of coronary angioplasty implant and graft: Secondary | ICD-10-CM

## 2018-07-09 DIAGNOSIS — I6522 Occlusion and stenosis of left carotid artery: Secondary | ICD-10-CM | POA: Diagnosis present

## 2018-07-09 DIAGNOSIS — Z7982 Long term (current) use of aspirin: Secondary | ICD-10-CM | POA: Diagnosis not present

## 2018-07-09 DIAGNOSIS — Z23 Encounter for immunization: Secondary | ICD-10-CM

## 2018-07-09 DIAGNOSIS — G40909 Epilepsy, unspecified, not intractable, without status epilepticus: Secondary | ICD-10-CM | POA: Diagnosis present

## 2018-07-09 DIAGNOSIS — Z87440 Personal history of urinary (tract) infections: Secondary | ICD-10-CM | POA: Diagnosis not present

## 2018-07-09 DIAGNOSIS — F17200 Nicotine dependence, unspecified, uncomplicated: Secondary | ICD-10-CM | POA: Diagnosis present

## 2018-07-09 DIAGNOSIS — E785 Hyperlipidemia, unspecified: Secondary | ICD-10-CM | POA: Diagnosis present

## 2018-07-09 DIAGNOSIS — I471 Supraventricular tachycardia: Secondary | ICD-10-CM | POA: Diagnosis present

## 2018-07-09 DIAGNOSIS — G4733 Obstructive sleep apnea (adult) (pediatric): Secondary | ICD-10-CM | POA: Diagnosis present

## 2018-07-09 DIAGNOSIS — Z79899 Other long term (current) drug therapy: Secondary | ICD-10-CM

## 2018-07-09 DIAGNOSIS — K219 Gastro-esophageal reflux disease without esophagitis: Secondary | ICD-10-CM | POA: Diagnosis present

## 2018-07-09 DIAGNOSIS — I69354 Hemiplegia and hemiparesis following cerebral infarction affecting left non-dominant side: Principal | ICD-10-CM

## 2018-07-09 DIAGNOSIS — I10 Essential (primary) hypertension: Secondary | ICD-10-CM | POA: Diagnosis present

## 2018-07-09 DIAGNOSIS — Z8249 Family history of ischemic heart disease and other diseases of the circulatory system: Secondary | ICD-10-CM

## 2018-07-09 DIAGNOSIS — D696 Thrombocytopenia, unspecified: Secondary | ICD-10-CM | POA: Diagnosis not present

## 2018-07-09 DIAGNOSIS — I351 Nonrheumatic aortic (valve) insufficiency: Secondary | ICD-10-CM | POA: Diagnosis present

## 2018-07-09 DIAGNOSIS — Z9842 Cataract extraction status, left eye: Secondary | ICD-10-CM

## 2018-07-09 DIAGNOSIS — Z91048 Other nonmedicinal substance allergy status: Secondary | ICD-10-CM | POA: Diagnosis not present

## 2018-07-09 DIAGNOSIS — I2510 Atherosclerotic heart disease of native coronary artery without angina pectoris: Secondary | ICD-10-CM | POA: Diagnosis not present

## 2018-07-09 DIAGNOSIS — E119 Type 2 diabetes mellitus without complications: Secondary | ICD-10-CM | POA: Diagnosis present

## 2018-07-09 DIAGNOSIS — K7460 Unspecified cirrhosis of liver: Secondary | ICD-10-CM | POA: Diagnosis present

## 2018-07-09 DIAGNOSIS — G8114 Spastic hemiplegia affecting left nondominant side: Secondary | ICD-10-CM | POA: Diagnosis not present

## 2018-07-09 DIAGNOSIS — K760 Fatty (change of) liver, not elsewhere classified: Secondary | ICD-10-CM | POA: Diagnosis present

## 2018-07-09 DIAGNOSIS — M21372 Foot drop, left foot: Secondary | ICD-10-CM | POA: Diagnosis present

## 2018-07-09 DIAGNOSIS — R296 Repeated falls: Secondary | ICD-10-CM | POA: Diagnosis present

## 2018-07-09 DIAGNOSIS — Z8673 Personal history of transient ischemic attack (TIA), and cerebral infarction without residual deficits: Secondary | ICD-10-CM | POA: Diagnosis not present

## 2018-07-09 DIAGNOSIS — Z90710 Acquired absence of both cervix and uterus: Secondary | ICD-10-CM

## 2018-07-09 DIAGNOSIS — I69322 Dysarthria following cerebral infarction: Secondary | ICD-10-CM | POA: Diagnosis not present

## 2018-07-09 DIAGNOSIS — Z7902 Long term (current) use of antithrombotics/antiplatelets: Secondary | ICD-10-CM

## 2018-07-09 DIAGNOSIS — K589 Irritable bowel syndrome without diarrhea: Secondary | ICD-10-CM | POA: Diagnosis present

## 2018-07-09 DIAGNOSIS — F17210 Nicotine dependence, cigarettes, uncomplicated: Secondary | ICD-10-CM | POA: Diagnosis present

## 2018-07-09 DIAGNOSIS — E7800 Pure hypercholesterolemia, unspecified: Secondary | ICD-10-CM | POA: Diagnosis present

## 2018-07-09 DIAGNOSIS — F419 Anxiety disorder, unspecified: Secondary | ICD-10-CM | POA: Diagnosis present

## 2018-07-09 MED ORDER — SENNOSIDES-DOCUSATE SODIUM 8.6-50 MG PO TABS
1.0000 | ORAL_TABLET | Freq: Every evening | ORAL | Status: DC | PRN
  Administered 2018-07-11 – 2018-07-12 (×2): 1 via ORAL
  Filled 2018-07-09 (×2): qty 1

## 2018-07-09 MED ORDER — ATORVASTATIN CALCIUM 80 MG PO TABS
80.0000 mg | ORAL_TABLET | Freq: Every day | ORAL | Status: DC
  Administered 2018-07-10 – 2018-07-15 (×6): 80 mg via ORAL
  Filled 2018-07-09 (×6): qty 1

## 2018-07-09 MED ORDER — CLOPIDOGREL BISULFATE 75 MG PO TABS: 75.0000 mg | ORAL_TABLET | Freq: Every day | ORAL | Status: DC

## 2018-07-09 MED ORDER — INFLUENZA VAC SPLIT HIGH-DOSE 0.5 ML IM SUSY
0.5000 mL | PREFILLED_SYRINGE | INTRAMUSCULAR | Status: AC
  Administered 2018-07-10: 0.5 mL via INTRAMUSCULAR
  Filled 2018-07-09: qty 0.5

## 2018-07-09 MED ORDER — ENSURE ENLIVE PO LIQD
237.0000 mL | Freq: Two times a day (BID) | ORAL | Status: DC
  Administered 2018-07-10 – 2018-07-14 (×3): 237 mL via ORAL

## 2018-07-09 MED ORDER — CLOPIDOGREL BISULFATE 75 MG PO TABS
75.0000 mg | ORAL_TABLET | Freq: Every day | ORAL | Status: DC
  Administered 2018-07-10 – 2018-07-15 (×6): 75 mg via ORAL
  Filled 2018-07-09 (×6): qty 1

## 2018-07-09 MED ORDER — ONDANSETRON HCL 4 MG PO TABS
4.0000 mg | ORAL_TABLET | Freq: Three times a day (TID) | ORAL | Status: DC | PRN
  Administered 2018-07-10 – 2018-07-15 (×3): 4 mg via ORAL
  Filled 2018-07-09 (×3): qty 1

## 2018-07-09 MED ORDER — SORBITOL 70 % SOLN
30.0000 mL | Freq: Every day | Status: DC | PRN
  Filled 2018-07-09: qty 30

## 2018-07-09 MED ORDER — LORAZEPAM 1 MG PO TABS: 1.0000 mg | ORAL_TABLET | Freq: Every day | ORAL | Status: DC | PRN

## 2018-07-09 MED ORDER — OXCARBAZEPINE 300 MG PO TABS: 300.0000 mg | ORAL_TABLET | Freq: Two times a day (BID) | ORAL | Status: DC

## 2018-07-09 MED ORDER — OXCARBAZEPINE 300 MG PO TABS
300.0000 mg | ORAL_TABLET | Freq: Two times a day (BID) | ORAL | Status: DC
  Administered 2018-07-09 – 2018-07-15 (×12): 300 mg via ORAL
  Filled 2018-07-09 (×12): qty 1

## 2018-07-09 MED ORDER — ACETAMINOPHEN 325 MG PO TABS
650.0000 mg | ORAL_TABLET | Freq: Four times a day (QID) | ORAL | Status: DC | PRN
  Administered 2018-07-10 – 2018-07-13 (×5): 650 mg via ORAL
  Filled 2018-07-09 (×5): qty 2

## 2018-07-09 MED ORDER — ASPIRIN EC 81 MG PO TBEC
81.0000 mg | DELAYED_RELEASE_TABLET | Freq: Every day | ORAL | Status: DC
  Administered 2018-07-10 – 2018-07-15 (×6): 81 mg via ORAL
  Filled 2018-07-09 (×6): qty 1

## 2018-07-09 NOTE — Progress Notes (Signed)
Correction: Patient taken to inpatient rehab in transport chair.   RN also called and gave report to RN Maudry Mayhew.

## 2018-07-09 NOTE — PMR Pre-admission (Signed)
PMR Admission Coordinator Pre-Admission Assessment  Patient: [name] is an 77 y.o., female MRN: [id number] DOB: 1940-10-22 Height:   Weight: 62.2 kg              Insurance Information HMO: yes    PPO:      PCP:      IPA:      80/20:      OTHER: medicare advantage plan PRIMARY: [employer] Medicare      Policy#: [id number]      Subscriber: pt CM Name: Abigail Butts      Phone#: [phone]     Fax#: [phone] Pre-Cert#: [id number]      Employer: retired Benefits:  Phone #: [phone]     Name: 07/09/2018 Eff. Date: 09/08/2017     Deduct: none      Out of Pocket Max: $4400      Life Max: none CIR: $345 co pay per day days 1 until 5      SNF: no co pay days 1 until 20; $160 cop ay per day days 21 until 48; no co pay days 49 until 100 Outpatient: $40 co pay per visit     Co-Pay:  Visits per medical neccesity Home Health: 100%      Co-Pay: visits per medical neccesity DME: 80%     Co-Pay: 20% Providers: in network  SECONDARY: none        Medicaid Application Date:       Case Manager:  Disability Application Date:       Case Worker:   Emergency Contact Information Contact Information    Name Relation Home Work Mobile   [place] Son [phone]  [phone]   Rucha, Wissinger [phone]  [phone]   Luther, [place] Daughter   [phone]     Current Medical History  Patient Admitting Diagnosis: right occipital lobe infarct  History of Present Illness:  Emi Lymon is a 77 year old right-handed female history of aortic insufficiency, PSVT, CAD with stenting maintained on aspirin and Plavix, hypertension, type 2 diabetes mellitus and seizure disorder maintained on Trileptal .  Presented 07/06/2018 with acute onset of left facial droop dysarthria and left-sided weakness.  Cranial CT scan reviewed, unremarkable for acute intracranial process.  Per report, remote lacunar infarcts of the basal ganglia and cerebellum bilaterally.  Patient did receive TPA.  CT angiogram of head and  neck showed no emergent large vessel occlusion.  60% stenosis of left internal carotid artery.  MRI showed punctate new white matter infarction in the anterior right occipital lobe.  No hemorrhage or mass-effect.  Echocardiogram with ejection fraction of 32% grade 2 diastolic dysfunction.  EEG negative for seizure.  Presently remains on aspirin and Plavix for CVA prophylaxis.  Diet has been advanced to a regular consistency.    Complete NIHSS TOTAL: 0    Past Medical History  Past Medical History:  Diagnosis Date  . Anemia   . Aortic insufficiency    Moderate  . Back pain   . Carotid stenosis, bilateral   . Cirrhosis ([place])   . Closed left femoral fracture ([place]) 04/21/2017  . Coronary artery disease    Stent x 2 RCA 1995, cardiac catheterization 09/2016 showing only mild atherosclerosis  . DDD (degenerative disc disease), lumbar   . Essential hypertension   . Falls   . GERD (gastroesophageal reflux disease)   . Hiatal hernia   . History of stroke   . Hypercholesteremia   . IBS (irritable bowel syndrome)   . Non-alcoholic fatty liver  disease   . OSA (obstructive sleep apnea)    no CPAP  . Pericardial effusion    a. small by echo 2018.  Marland Kitchen PSVT (paroxysmal supraventricular tachycardia) ([place])   . Recurrent UTI   . Seizures ([place])   . Stroke ([place])   . Type 2 diabetes mellitus ([place])    off medication  . Varices, esophageal ([place])     Family History  family history includes Diabetes in her mother; Heart failure in her father and maternal aunt.  Prior Rehab/Hospitalizations:  Has the patient had major surgery during 100 days prior to admission? No  SNF for 3 months last year after hip surgery  Current Medications   Current Facility-Administered Medications:  .  acetaminophen (TYLENOL) tablet 650 mg, 650 mg, Oral, Q6H PRN, Donzetta Starch, NP, 650 mg at 07/09/18 0022 .  aspirin EC tablet 81 mg, 81 mg, Oral, Daily, Biby, Sharon L, NP, 81 mg at 07/09/18 1058 .  atorvastatin  (LIPITOR) tablet 80 mg, 80 mg, Oral, Daily, Marliss Coots, PA-C, 80 mg at 07/09/18 1057 .  clopidogrel (PLAVIX) tablet 75 mg, 75 mg, Oral, Q breakfast, Biby, Sharon L, NP, 75 mg at 07/09/18 1057 .  Influenza vac split quadrivalent PF (FLUZONE HIGH-DOSE) injection 0.5 mL, 0.5 mL, Intramuscular, Prior to discharge, Greta Doom, MD .  LORazepam (ATIVAN) tablet 1 mg, 1 mg, Oral, Daily PRN, Marliss Coots, PA-C .  nicardipine (CARDENE) 79m in 0.86% saline 2037mIV infusion (0.1 mg/ml), 3-15 mg/hr, Intravenous, Continuous PRN, KiGreta DoomMD .  ondansetron (ZDigestive Disease Endoscopy Centerinjection 4 mg, 4 mg, Intravenous, Q8H, Biby, Sharon L, NP, 4 mg at 07/09/18 0527 .  ondansetron (ZOFRAN) tablet 4 mg, 4 mg, Oral, Q8H PRN, LiKerney ElbeMD, 4 mg at 07/08/18 0928 .  Oxcarbazepine (TRILEPTAL) tablet 300 mg, 300 mg, Oral, BID, KiGreta DoomMD, 300 mg at 07/09/18 1058 .  RESOURCE THICKENUP CLEAR, , Oral, PRN, PiDavonna BellingMD .  senna-docusate (Senokot-S) tablet 1 tablet, 1 tablet, Oral, QHS PRN, SmMarliss CootsPA-C  Patients Current Diet:  Diet Order            Diet regular Room service appropriate? Yes; Fluid consistency: Thin  Diet effective now              Precautions / Restrictions Precautions Precautions: Fall Restrictions Weight Bearing Restrictions: No   Has the patient had 2 or more falls or a fall with injury in the past year?No  Prior Activity Level Limited Community (1-2x/wk): Mod I with RW at home  HoGranjeno EqRodneyevices/Equipment: WaGilford Rilespecify type) Home Equipment: WaGilford Rile 2 wheels, Wheelchair - manual, Ca[employer] single point, ShGuardian Life InsuranceBedside commode, Grab bars - tub/shower  Prior Device Use: Indicate devices/aids used by the patient prior to current illness, exacerbation or injury? Walker and wheelchair  Prior Functional Level Prior Function Level of Independence: Independent with assistive device(s) Gait /  Transfers Assistance Needed: Primarily uses RW per son ADL's / Homemaking Assistance Needed: pt able to complete ADL with S of family; pt cooking dinner last week Comments: household ambulator with RW, uses wheelchair for community distances  Self Care: Did the patient need help bathing, dressing, using the toilet or eating?  Needed some help  Indoor Mobility: Did the patient need assistance with walking from room to room (with or without device)? Independent  Stairs: Did the patient need assistance with internal or external stairs (with or without device)?  Needed some help  Functional Cognition: Did the patient need help planning regular tasks such as shopping or remembering to take medications? Needed some help  Current Functional Level Cognition  Arousal/Alertness: Awake/alert Overall Cognitive Status: Impaired/Different from baseline Current Attention Level: Selective Orientation Level: Oriented X4 Following Commands: Follows one step commands consistently, Follows one step commands with increased time, Follows multi-step commands inconsistently, Follows multi-step commands with increased time Safety/Judgement: Decreased awareness of deficits, Decreased awareness of safety Attention: Selective Selective Attention: Appears intact Memory: Impaired Memory Impairment: Retrieval deficit, Prospective memory Awareness: Appears intact Executive Function: Reasoning Reasoning: Impaired Reasoning Impairment: Verbal complex Safety/Judgment: Appears intact    Extremity Assessment (includes Sensation/Coordination)  Upper Extremity Assessment: Generalized weakness, LUE deficits/detail LUE Deficits / Details: using functionally however movemetns are slow and intentional; decreased in-hand manipulation skills LUE Coordination: decreased fine motor  Lower Extremity Assessment: Defer to PT evaluation LLE Deficits / Details: noted LLE weakness with significant resting left ankle inversion and  plantarflexion position LLE Coordination: decreased fine motor, decreased gross motor    ADLs  Overall ADL's : Needs assistance/impaired Eating/Feeding: Minimal assistance Eating/Feeding Details (indicate cue type and reason): nectar thick Grooming: Supervision/safety, Set up, Sitting Upper Body Bathing: Minimal assistance, Sitting Lower Body Bathing: Moderate assistance, Sit to/from stand Upper Body Dressing : Moderate assistance, Sitting Lower Body Dressing: Moderate assistance, Sit to/from stand Toilet Transfer: Minimal assistance Toileting- Clothing Manipulation and Hygiene: Moderate assistance, Sit to/from stand Functional mobility during ADLs: +2 for safety/equipment, Minimal assistance General ADL Comments: difficulty with initiation; very slow movements; requires physical assist to stand from chair(took @ 5 steps in 5 min)    Mobility  Overal bed mobility: Needs Assistance Bed Mobility: Supine to Sit Supine to sit: Supervision General bed mobility comments: S to complete transfer, extended time     Transfers  Overall transfer level: Needs assistance Equipment used: Rolling walker (2 wheeled) Transfers: Sit to/from Stand, Stand Pivot Transfers Sit to Stand: Min guard Stand pivot transfers: Min guard General transfer comment: close min guard for safety and steadying with functional transfers, cues for safe use of RW     Ambulation / Gait / Stairs / Wheelchair Mobility  Ambulation/Gait Ambulation/Gait assistance: Counsellor (Feet): 30 Feet Assistive device: Rolling walker (2 wheeled) Gait Pattern/deviations: Step-to pattern, Trunk flexed, Decreased step length - left, Decreased stance time - left, Narrow base of support General Gait Details: very slow gait pattern with ongoing reduced step length, flexed at hips and continues to report L hip pain Gait velocity: non-functional speed Gait velocity interpretation: <1.31 ft/sec, indicative of household ambulator     Posture / Balance Dynamic Sitting Balance Sitting balance - Comments: able to take off socks/put on shoes and L LE brace at EOB  Balance Overall balance assessment: Needs assistance Sitting balance-Leahy Scale: Good Sitting balance - Comments: able to take off socks/put on shoes and L LE brace at EOB  Standing balance support: During functional activity Standing balance-Leahy Scale: Poor Standing balance comment: heavy reliance on B UE support     Special needs/care consideration BiPAP/CPAP n/a CPM n/a Continuous Drip IV n/a Dialysis n/a Life Vest n/a Oxygen n/a Special Bed n/a Trach Size n/a Wound Vac n/a Skin intact Bowel mgmt: continent LBM 10/31 Bladder mgmt: continent Diabetic mgmt yes Hgb A1c 4.6   Previous Home Environment Living Arrangements: (Her 94 year old grand daughter lives with her)  Lives With: (61 year old grand daughter lives with her; son, [name] lives) Available Help at  Discharge: Available 24 hours/day(Robert states he will provide what ever is needed) Type of Home: House Home Layout: Multi-level, Two level Alternate Level Stairs-Rails: Right Alternate Level Stairs-Number of Steps: 12-15 steps to basement (patient states she does not go to basement) Home Access: Stairs to enter Entrance Stairs-Rails: Left Entrance Stairs-Number of Steps: 2 Bathroom Shower/Tub: Chiropodist: Standard Bathroom Accessibility: Yes How Accessible: Accessible via walker [place]: No  Discharge Living Setting Plans for Discharge Living Setting: Patient's home Type of Home at Discharge: House Discharge Home Layout: Multi-level, Laundry or work area in basement Alternate Level Stairs-Rails: Left Alternate Level Stairs-Number of Steps: 2 Discharge Home Access: Stairs to enter Entrance Stairs-Rails: Left Entrance Stairs-Number of Steps: 2 Discharge Bathroom Shower/Tub: Tub/shower unit, Curtain Discharge Bathroom Toilet: Standard Discharge  Bathroom Accessibility: Yes How Accessible: Accessible via walker Does the patient have any problems obtaining your medications?: No  Social/Family/Support Systems Patient Roles: Parent Contact Information: [name], son Anticipated Caregiver: [name] and her grand daughter Anticipated Ambulance person Information: see above Ability/Limitations of Caregiver: [name] unemployed an dlives across the street; 49 year old grand daughter in school to become Therapist, sports and works as a cna at [employer] Caregiver Availability: 24/7 Discharge Plan Discussed with Primary Caregiver: Yes Is Caregiver In Agreement with Plan?: Yes Does Caregiver/Family have Issues with Lodging/Transportation while Pt is in Rehab?: No  Goals/Additional Needs Patient/Family Goal for Rehab: supervision with PT and OT Expected length of stay: ELOS 4 to 7 days Pt/Family Agrees to Admission and willing to participate: Yes Program Orientation Provided & Reviewed with Pt/Caregiver Including Roles  & Responsibilities: Yes  Decrease burden of Care through IP rehab admission: n/a  Possible need for SNF placement upon discharge: not expected  Patient Condition: This patient's medical and functional status remains as with the consult dated: 07/07/2018 in which the Rehabilitation Physician determined and documented that the patient's condition is appropriate for intensive rehabilitative care in an inpatient rehabilitation facility but patient requested discharge directly home. I have reviewed with patient and her son benefits of an inpatient rehab admit, and they are now in agreement. .  Functional changes are: overall min assist. After evaluating the patient today and speaking with the Rehabilitation physician and acute team, the patient remains appropriate for inpatient rehab. Will admit to inpatient rehab today.  Preadmission Screen Completed By:  Cleatrice Burke, 07/09/2018 2:38  PM ______________________________________________________________________   Discussed status with Dr. Naaman Plummer on 07/09/2018 at  1446 and received telephone approval for admission today.  Admission Coordinator:  Cleatrice Burke, time 2426 on 07/09/2018

## 2018-07-09 NOTE — Progress Notes (Signed)
Received pt. As a new admission.Pt. Was oriented to the unit protocol.Safety plan was explained,fall prevention plan was explained and signed.

## 2018-07-09 NOTE — Discharge Summary (Addendum)
Stroke Discharge Summary  Patient ID: [name]   MRN: [id number]      DOB: 15-Feb-1941  Date of Admission: 07/06/2018 Date of Discharge: 07/09/2018  Attending Physician:  Garvin Fila, MD, Stroke MD Consultant(s):   Delice Lesch, MD (Physical Medicine & Rehabtilitation)  Patient's PCP:  Redmond School, MD  Discharge Diagnoses:  Principal Problem:   Cerebral infarction [facility]) right occipital due to small vessel disease s/p tPA Active Problems:   CAD (coronary artery disease)   Hypercholesteremia   Presence of stent in right coronary artery   OSA (obstructive sleep apnea)   Aortic insufficiency   Dysarthria   Benign essential HTN   Seizures ([place])   Dysphagia, post-stroke   Tobacco use disorder  Past Medical History:  Diagnosis Date  . Anemia   . Aortic insufficiency    Moderate  . Back pain   . Carotid stenosis, bilateral   . Cirrhosis ([place])   . Closed left femoral fracture ([place]) 04/21/2017  . Coronary artery disease    Stent x 2 RCA 1995, cardiac catheterization 09/2016 showing only mild atherosclerosis  . DDD (degenerative disc disease), lumbar   . Essential hypertension   . Falls   . GERD (gastroesophageal reflux disease)   . Hiatal hernia   . History of stroke   . Hypercholesteremia   . IBS (irritable bowel syndrome)   . Non-alcoholic fatty liver disease   . OSA (obstructive sleep apnea)    no CPAP  . Pericardial effusion    a. small by echo 2018.  Marland Kitchen PSVT (paroxysmal supraventricular tachycardia) ([place])   . Recurrent UTI   . Seizures ([place])   . Stroke ([place])   . Type 2 diabetes mellitus ([place])    off medication  . Varices, esophageal ([place])    Past Surgical History:  Procedure Laterality Date  . [place]  . APPENDECTOMY    . BACK SURGERY  1985  . CARDIAC CATHETERIZATION  [phone]   Stent to the proximal RCA after MI   . CARDIAC CATHETERIZATION N/A 09/26/2016   Procedure: Left Heart Cath and Coronary Angiography;  Surgeon:  Leonie Man, MD;  Location: [place] [facility];  Service: Cardiovascular;  Laterality: N/A;  . CATARACT EXTRACTION W/PHACO Right 07/04/2014   Procedure: CATARACT EXTRACTION PHACO AND INTRAOCULAR LENS PLACEMENT (IOC);  Surgeon: Elta Guadeloupe T. Gershon Crane, MD;  Location: [facility];  Service: Ophthalmology;  Laterality: Right;  CDE:13.13  . COLONOSCOPY    . DILATION AND CURETTAGE OF UTERUS     x2  . Epi Retinal Membrane Peel Left   . ERCP    . ESOPHAGEAL BANDING N/A 04/01/2013   Procedure: ESOPHAGEAL BANDING;  Surgeon: Rogene Houston, MD;  [facility];  Service: Endoscopy;  Laterality: N/A;  . ESOPHAGEAL BANDING N/A 05/24/2013   Procedure: ESOPHAGEAL BANDING;  Surgeon: Rogene Houston, MD;  [facility];  Service: Endoscopy;  Laterality: N/A;  . ESOPHAGEAL BANDING N/A 06/21/2014   Procedure: ESOPHAGEAL BANDING;  Surgeon: Rogene Houston, MD;  [facility];  Service: Endoscopy;  Laterality: N/A;  . ESOPHAGOGASTRODUODENOSCOPY N/A 04/01/2013   Procedure: ESOPHAGOGASTRODUODENOSCOPY (EGD);  Surgeon: Rogene Houston, MD;  [facility];  Service: Endoscopy;  Laterality: N/A;  230-rescheduled to 8:30am Ann notified pt  . ESOPHAGOGASTRODUODENOSCOPY N/A 05/24/2013   Procedure: ESOPHAGOGASTRODUODENOSCOPY (EGD);  Surgeon: Rogene Houston, MD;  [facility];  Service: Endoscopy;  Laterality: N/A;  730  .  ESOPHAGOGASTRODUODENOSCOPY N/A 06/21/2014   Procedure: ESOPHAGOGASTRODUODENOSCOPY (EGD);  Surgeon: Rogene Houston, MD;  [facility];  Service: Endoscopy;  Laterality: N/A;  930-rescheduled 10/14 @ 1200 Ann to notify pt  . EYE SURGERY  08   cataract surgery of the left eye  . FEMUR IM NAIL Left 03/02/2017   Procedure: INTRAMEDULLARY (IM) NAIL FEMORAL;  Surgeon: Rod Can, MD;  Location: [place];  Service: Orthopedics;  Laterality: Left;  . HARDWARE REMOVAL Right 01/17/2013   Procedure: REMOVAL OF HARDWARE AND EXCISION ULNAR STYLOID RIGHT WRIST;   Surgeon: Tennis Must, MD;  Location: [place];  Service: Orthopedics;  Laterality: Right;  . MYRINGOTOMY  2012   both ears  . ORIF FEMUR FRACTURE Left 04/23/2017   Procedure: OPEN REDUCTION INTERNAL FIXATION (ORIF) DISTAL FEMUR FRACTURE (FRACTURE AROUND FEMORAL NAIL);  Surgeon: Altamese Martinsburg, MD;  Location: [place];  Service: Orthopedics;  Laterality: Left;  . TONSILLECTOMY    . VAGINAL HYSTERECTOMY  1972  . WRIST SURGERY     rt wrist hardwear removal    Medications to be continued on Rehab Allergies as of 07/09/2018      Reactions   Tape Rash, Other (See Comments)   Please use paper tape      Medication List    STOP taking these medications   aspirin-acetaminophen-caffeine 250-250-65 MG tablet Commonly known as:  EXCEDRIN MIGRAINE   ciprofloxacin 500 MG tablet Commonly known as:  CIPRO   HYDROcodone-acetaminophen 10-325 MG tablet Commonly known as:  NORCO   levETIRAcetam 500 MG tablet Commonly known as:  KEPPRA   tiZANidine 4 MG tablet Commonly known as:  ZANAFLEX     TAKE these medications   aspirin EC 81 MG tablet Take 81 mg by mouth daily.   atorvastatin 80 MG tablet Commonly known as:  LIPITOR Take 1 tablet (80 mg total) by mouth daily.   carvedilol 12.5 MG tablet Commonly known as:  COREG Take 1 tablet (12.5 mg total) by mouth 2 (two) times daily with a meal.   CENTRUM ADULTS Tabs Take 1 tablet by mouth daily.   clopidogrel 75 MG tablet Commonly known as:  PLAVIX Take 1 tablet (75 mg total) by mouth daily with breakfast. Start taking on:  07/10/2018   LORazepam 1 MG tablet Commonly known as:  ATIVAN Take 1 tablet by mouth daily as needed.   niacin 500 MG CR tablet Commonly known as:  NIASPAN Take 1 tablet by mouth daily.   omeprazole 20 MG capsule Commonly known as:  PRILOSEC Take 1 capsule (20 mg total) by mouth daily.   ondansetron 4 MG tablet Commonly known as:  ZOFRAN Take 1 tablet (4 mg total) by mouth every 8 (eight)  hours as needed for nausea or vomiting.   Oxcarbazepine 300 MG tablet Commonly known as:  TRILEPTAL Take 1 tablet (300 mg total) by mouth 2 (two) times daily. What changed:  when to take this   ranitidine 150 MG tablet Commonly known as:  ZANTAC Take 1 tablet by mouth 2 (two) times daily as needed.       LABORATORY STUDIES CBC    Component Value Date/Time   WBC 9.2 07/08/2018 0438   RBC 3.86 (L) 07/08/2018 0438   HGB 12.0 07/08/2018 0438   HCT 36.2 07/08/2018 0438   PLT 132 (L) 07/08/2018 0438   MCV 93.8 07/08/2018 0438   MCH 31.1 07/08/2018 0438   MCHC 33.1 07/08/2018 0438   RDW 13.8 07/08/2018 0438  LYMPHSABS 2.2 07/06/2018 1134   MONOABS 0.5 07/06/2018 1134   EOSABS 0.2 07/06/2018 1134   BASOSABS 0.0 07/06/2018 1134   CMP    Component Value Date/Time   NA 140 07/08/2018 0438   K 3.5 07/08/2018 0438   CL 108 07/08/2018 0438   CO2 25 07/08/2018 0438   GLUCOSE 115 (H) 07/08/2018 0438   BUN <5 (L) 07/08/2018 0438   CREATININE 0.70 07/08/2018 0438   CREATININE 0.78 01/12/2018 1310   CALCIUM 8.5 (L) 07/08/2018 0438   CALCIUM 8.4 (L) 04/22/2017 0514   PROT 5.5 (L) 07/06/2018 1134   PROT 6.3 03/24/2018 1134   ALBUMIN 3.3 (L) 07/06/2018 1134   ALBUMIN 4.1 03/24/2018 1134   AST 40 07/06/2018 1134   ALT 31 07/06/2018 1134   ALKPHOS 94 07/06/2018 1134   BILITOT 1.0 07/06/2018 1134   BILITOT 0.5 03/24/2018 1134   GFRNONAA >60 07/08/2018 0438   GFRNONAA 74 01/12/2018 1310   GFRAA >60 07/08/2018 0438   GFRAA 86 01/12/2018 1310   COAGS Lab Results  Component Value Date   INR 1.20 07/06/2018   INR 1.24 05/19/2018   INR 1.17 05/07/2018   Lipid Panel    Component Value Date/Time   CHOL 109 07/07/2018 0432   CHOL 142 12/07/2013 1038   TRIG 50 07/07/2018 0432   TRIG 98 12/07/2013 1038   HDL 49 07/07/2018 0432   HDL 47 12/07/2013 1038   CHOLHDL 2.2 07/07/2018 0432   VLDL 10 07/07/2018 0432   LDLCALC 50 07/07/2018 0432   LDLCALC 83 09/04/2017 1023   LDLCALC  75 12/07/2013 1038   HgbA1C  Lab Results  Component Value Date   HGBA1C 4.6 (L) 07/07/2018   Urinalysis    Component Value Date/Time   COLORURINE AMBER (A) 05/19/2018 1425   APPEARANCEUR HAZY (A) 05/19/2018 1425   LABSPEC 1.023 05/19/2018 1425   PHURINE 5.0 05/19/2018 1425   GLUCOSEU NEGATIVE 05/19/2018 1425   HGBUR NEGATIVE 05/19/2018 1425   BILIRUBINUR NEGATIVE 05/19/2018 1425   KETONESUR NEGATIVE 05/19/2018 1425   PROTEINUR NEGATIVE 05/19/2018 1425   UROBILINOGEN 0.2 07/09/2015 0930   NITRITE NEGATIVE 05/19/2018 1425   LEUKOCYTESUR NEGATIVE 05/19/2018 1425   Urine Drug Screen     Component Value Date/Time   LABOPIA NONE DETECTED 05/19/2018 1425   COCAINSCRNUR NONE DETECTED 05/19/2018 1425   LABBENZ POSITIVE (A) 05/19/2018 1425   AMPHETMU NONE DETECTED 05/19/2018 1425   THCU NONE DETECTED 05/19/2018 1425   LABBARB NONE DETECTED 05/19/2018 1425    Alcohol Level    Component Value Date/Time   ETH <10 07/06/2018 1134     SIGNIFICANT DIAGNOSTIC STUDIES Ct Angio Head W Or Wo Contrast  Result Date: 07/06/2018 CLINICAL DATA:  Acute onset of left-sided weakness and slurred speech beginning 2 hours ago. EXAM: CT ANGIOGRAPHY HEAD AND NECK CT PERFUSION BRAIN TECHNIQUE: Multidetector CT imaging of the head and neck was performed using the standard protocol during bolus administration of intravenous contrast. Multiplanar CT image reconstructions and MIPs were obtained to evaluate the vascular anatomy. Carotid stenosis measurements (when applicable) are obtained utilizing NASCET criteria, using the distal internal carotid diameter as the denominator. Multiphase CT imaging of the brain was performed following IV bolus contrast injection. Subsequent parametric perfusion maps were calculated using RAPID software. CONTRAST:  234m ISOVUE-370 IOPAMIDOL (ISOVUE-370) INJECTION 76% COMPARISON:  CT without contrast 07/06/2018.  MRI brain 05/19/2018 FINDINGS: CTA NECK FINDINGS Aortic arch: A 3  vessel arch configuration is present. Atherosclerotic calcifications are present at  the aortic arch and at the origins of the great vessels without significant stenosis relative to the more distal vessels. Calcifications along the proximal left subclavian artery are stable. Right carotid system: The right common carotid artery is within normal limits. Atherosclerotic calcifications are again seen at the right carotid bifurcation. There is no significant stenosis. There is mild tortuosity of the cervical right ICA without significant stenosis. Left carotid system: Left common carotid artery is tortuous proximally without significant stenosis. The lumen is narrowed to 2 mm. More distal left ICA measures 4.8 mm. No tandem stenosis is present. Vertebral arteries: Atherosclerotic calcifications are present at the origin of the vertebral arteries bilaterally. There is no significant stenosis at the origins. Mild narrowing is associated with tortuosity of the non dominant left vertebral artery. There is tortuosity of the vertebral arteries more distally without other focal stenoses in the neck. Skeleton: Degenerative retrolisthesis at C3-4 and anterolisthesis at C4-5 is stable. Chronic loss of disc height is again noted at C5-6 and C6-7 with right foraminal narrowing at C5-6. No focal lytic or blastic lesions are present. Other neck: A 10 mm left thyroid nodule is stable. No significant cervical adenopathy is present. No primary mucosal lesions are present. Salivary glands are within normal limits. Upper chest: Mild dependent atelectasis is present. No focal nodule, mass, or airspace disease is present. Review of the MIP images confirms the above findings CTA HEAD FINDINGS Anterior circulation: Atherosclerotic calcifications are again seen within the cavernous internal carotid arteries bilaterally. There is no significant stenosis proximal to the ICA termini. The A1 and M1 segments are normal. MCA bifurcations are intact.  There is some attenuation of distal branch vessels. ACA and MCA branch vessels are otherwise normal. The anterior communicating artery is patent. No aneurysms are present. Posterior circulation: The right vertebral artery is the dominant vessel. PICA origins are visualized and normal. The vertebrobasilar junction is normal. Both posterior cerebral arteries originate from the basilar tip. Basilar artery is unremarkable. Moderate right P2 segment stenosis is again seen. PCA branch vessels are otherwise stable. Venous sinuses: The dural sinuses are patent. Straight sinus deep cerebral veins are intact. Cortical veins are unremarkable. Anatomic variants: None Review of the MIP images confirms the above findings CT Brain Perfusion Findings: CBF (<30%) Volume: 5m Perfusion (Tmax>6.0s) volume: 04mMismatch Volume: 35m1mnfarction Location:N/a IMPRESSION: 1. CT perfusion demonstrates no areas of core infarct or ischemic tissue at risk. 2. CTA demonstrates no emergent large vessel occlusion. 3. 60% stenosis of the left internal carotid artery is stable. 4. Atherosclerotic changes at the aortic arch and right carotid bifurcation are stable without significant stenoses. 5. Left subclavian artery atherosclerotic disease without significant stenosis. 6. Stable calcifications of the cavernous internal carotid arteries bilaterally without significant stenoses. 7. Stable moderate right P2 segment stenosis. 8. Multilevel degenerative changes of the cervical spine are stable. These results were called by telephone at the time of interpretation on 07/06/2018 at 1:16 pm to Dr. ZAMRoderic Palauwho verbally acknowledged these results. Electronically Signed   By: ChrSan MorelleD.   On: 07/06/2018 13:16   Ct Angio Neck W Or Wo Contrast  Result Date: 07/06/2018 CLINICAL DATA:  Acute onset of left-sided weakness and slurred speech beginning 2 hours ago. EXAM: CT ANGIOGRAPHY HEAD AND NECK CT PERFUSION BRAIN TECHNIQUE: Multidetector CT  imaging of the head and neck was performed using the standard protocol during bolus administration of intravenous contrast. Multiplanar CT image reconstructions and MIPs were obtained to evaluate the vascular  anatomy. Carotid stenosis measurements (when applicable) are obtained utilizing NASCET criteria, using the distal internal carotid diameter as the denominator. Multiphase CT imaging of the brain was performed following IV bolus contrast injection. Subsequent parametric perfusion maps were calculated using RAPID software. CONTRAST:  263m ISOVUE-370 IOPAMIDOL (ISOVUE-370) INJECTION 76% COMPARISON:  CT without contrast 07/06/2018.  MRI brain 05/19/2018 FINDINGS: CTA NECK FINDINGS Aortic arch: A 3 vessel arch configuration is present. Atherosclerotic calcifications are present at the aortic arch and at the origins of the great vessels without significant stenosis relative to the more distal vessels. Calcifications along the proximal left subclavian artery are stable. Right carotid system: The right common carotid artery is within normal limits. Atherosclerotic calcifications are again seen at the right carotid bifurcation. There is no significant stenosis. There is mild tortuosity of the cervical right ICA without significant stenosis. Left carotid system: Left common carotid artery is tortuous proximally without significant stenosis. The lumen is narrowed to 2 mm. More distal left ICA measures 4.8 mm. No tandem stenosis is present. Vertebral arteries: Atherosclerotic calcifications are present at the origin of the vertebral arteries bilaterally. There is no significant stenosis at the origins. Mild narrowing is associated with tortuosity of the non dominant left vertebral artery. There is tortuosity of the vertebral arteries more distally without other focal stenoses in the neck. Skeleton: Degenerative retrolisthesis at C3-4 and anterolisthesis at C4-5 is stable. Chronic loss of disc height is again noted at C5-6  and C6-7 with right foraminal narrowing at C5-6. No focal lytic or blastic lesions are present. Other neck: A 10 mm left thyroid nodule is stable. No significant cervical adenopathy is present. No primary mucosal lesions are present. Salivary glands are within normal limits. Upper chest: Mild dependent atelectasis is present. No focal nodule, mass, or airspace disease is present. Review of the MIP images confirms the above findings CTA HEAD FINDINGS Anterior circulation: Atherosclerotic calcifications are again seen within the cavernous internal carotid arteries bilaterally. There is no significant stenosis proximal to the ICA termini. The A1 and M1 segments are normal. MCA bifurcations are intact. There is some attenuation of distal branch vessels. ACA and MCA branch vessels are otherwise normal. The anterior communicating artery is patent. No aneurysms are present. Posterior circulation: The right vertebral artery is the dominant vessel. PICA origins are visualized and normal. The vertebrobasilar junction is normal. Both posterior cerebral arteries originate from the basilar tip. Basilar artery is unremarkable. Moderate right P2 segment stenosis is again seen. PCA branch vessels are otherwise stable. Venous sinuses: The dural sinuses are patent. Straight sinus deep cerebral veins are intact. Cortical veins are unremarkable. Anatomic variants: None Review of the MIP images confirms the above findings CT Brain Perfusion Findings: CBF (<30%) Volume: 0617mPerfusion (Tmax>6.0s) volume: 17m77mismatch Volume: 17mL47mfarction Location:N/a IMPRESSION: 1. CT perfusion demonstrates no areas of core infarct or ischemic tissue at risk. 2. CTA demonstrates no emergent large vessel occlusion. 3. 60% stenosis of the left internal carotid artery is stable. 4. Atherosclerotic changes at the aortic arch and right carotid bifurcation are stable without significant stenoses. 5. Left subclavian artery atherosclerotic disease without  significant stenosis. 6. Stable calcifications of the cavernous internal carotid arteries bilaterally without significant stenoses. 7. Stable moderate right P2 segment stenosis. 8. Multilevel degenerative changes of the cervical spine are stable. These results were called by telephone at the time of interpretation on 07/06/2018 at 1:16 pm to Dr. ZAMMRoderic Palauho verbally acknowledged these results. Electronically Signed   By: ChriHarrell Gave  Mattern M.D.   On: 07/06/2018 13:16   Mr Brain Wo Contrast  Result Date: 07/07/2018 CLINICAL DATA:  77 year old female code stroke presentation on 07/06/2018. Chronic right MCA infarct EXAM: MRI HEAD WITHOUT CONTRAST TECHNIQUE: Multiplanar, multiecho pulse sequences of the brain and surrounding structures were obtained without intravenous contrast. COMPARISON:  CTA head and neck, CT perfusion and noncontrast head CT 07/06/2018. [facility] brain MRI 05/19/2018 and earlier. FINDINGS: Brain: Persistent small area of trace diffusion abnormality in the left superior frontal gyrus subcortical white matter (series 5, image 60 and series 7, image 45), not significantly changed since August. Corresponding T2 and FLAIR hyperintensity has developed. No hemorrhage or mass effect. New punctate focus of restricted diffusion right inferior occipital lobe white matter on series 5, image 46. No hemorrhage or mass effect. No other convincing restricted diffusion. Stable middle right MCA territory encephalomalacia with hemosiderin. Stable small chronic infarcts in the cerebellum and left deep gray matter. Stable patchy T2 and FLAIR hyperintensity in the pons and scattered cerebral white matter. No midline shift, mass effect, evidence of mass lesion, ventriculomegaly, extra-axial collection or acute intracranial hemorrhage. Cervicomedullary junction and pituitary are within normal limits. Vascular: Major intracranial vascular flow voids are stable. Skull and upper cervical spine: Chronic  C3-C4 disc and endplate degeneration. Visualized bone marrow signal is within normal limits. Sinuses/Orbits: Stable and negative. Other: Stable trace mastoid effusions. Negative nasopharynx. Scalp and face soft tissues are negative. IMPRESSION: 1. Punctate new white matter infarct in the anterior right occipital lobe. No hemorrhage or mass effect. 2. No other acute intracranial abnormality. Evolution of the small left superior frontal gyrus lacune since August with persistent signal abnormality on trace diffusion. Chronic middle right MCA territory encephalomalacia. Chronic underlying small vessel disease. Electronically Signed   By: Genevie Ann M.D.   On: 07/07/2018 11:19   Ct Cerebral Perfusion W Contrast  Result Date: 07/06/2018 CLINICAL DATA:  Acute onset of left-sided weakness and slurred speech beginning 2 hours ago. EXAM: CT ANGIOGRAPHY HEAD AND NECK CT PERFUSION BRAIN TECHNIQUE: Multidetector CT imaging of the head and neck was performed using the standard protocol during bolus administration of intravenous contrast. Multiplanar CT image reconstructions and MIPs were obtained to evaluate the vascular anatomy. Carotid stenosis measurements (when applicable) are obtained utilizing NASCET criteria, using the distal internal carotid diameter as the denominator. Multiphase CT imaging of the brain was performed following IV bolus contrast injection. Subsequent parametric perfusion maps were calculated using RAPID software. CONTRAST:  256m ISOVUE-370 IOPAMIDOL (ISOVUE-370) INJECTION 76% COMPARISON:  CT without contrast 07/06/2018.  MRI brain 05/19/2018 FINDINGS: CTA NECK FINDINGS Aortic arch: A 3 vessel arch configuration is present. Atherosclerotic calcifications are present at the aortic arch and at the origins of the great vessels without significant stenosis relative to the more distal vessels. Calcifications along the proximal left subclavian artery are stable. Right carotid system: The right common carotid  artery is within normal limits. Atherosclerotic calcifications are again seen at the right carotid bifurcation. There is no significant stenosis. There is mild tortuosity of the cervical right ICA without significant stenosis. Left carotid system: Left common carotid artery is tortuous proximally without significant stenosis. The lumen is narrowed to 2 mm. More distal left ICA measures 4.8 mm. No tandem stenosis is present. Vertebral arteries: Atherosclerotic calcifications are present at the origin of the vertebral arteries bilaterally. There is no significant stenosis at the origins. Mild narrowing is associated with tortuosity of the non dominant left vertebral artery. There is tortuosity  of the vertebral arteries more distally without other focal stenoses in the neck. Skeleton: Degenerative retrolisthesis at C3-4 and anterolisthesis at C4-5 is stable. Chronic loss of disc height is again noted at C5-6 and C6-7 with right foraminal narrowing at C5-6. No focal lytic or blastic lesions are present. Other neck: A 10 mm left thyroid nodule is stable. No significant cervical adenopathy is present. No primary mucosal lesions are present. Salivary glands are within normal limits. Upper chest: Mild dependent atelectasis is present. No focal nodule, mass, or airspace disease is present. Review of the MIP images confirms the above findings CTA HEAD FINDINGS Anterior circulation: Atherosclerotic calcifications are again seen within the cavernous internal carotid arteries bilaterally. There is no significant stenosis proximal to the ICA termini. The A1 and M1 segments are normal. MCA bifurcations are intact. There is some attenuation of distal branch vessels. ACA and MCA branch vessels are otherwise normal. The anterior communicating artery is patent. No aneurysms are present. Posterior circulation: The right vertebral artery is the dominant vessel. PICA origins are visualized and normal. The vertebrobasilar junction is  normal. Both posterior cerebral arteries originate from the basilar tip. Basilar artery is unremarkable. Moderate right P2 segment stenosis is again seen. PCA branch vessels are otherwise stable. Venous sinuses: The dural sinuses are patent. Straight sinus deep cerebral veins are intact. Cortical veins are unremarkable. Anatomic variants: None Review of the MIP images confirms the above findings CT Brain Perfusion Findings: CBF (<30%) Volume: 57m Perfusion (Tmax>6.0s) volume: 043mMismatch Volume: 58m46mnfarction Location:N/a IMPRESSION: 1. CT perfusion demonstrates no areas of core infarct or ischemic tissue at risk. 2. CTA demonstrates no emergent large vessel occlusion. 3. 60% stenosis of the left internal carotid artery is stable. 4. Atherosclerotic changes at the aortic arch and right carotid bifurcation are stable without significant stenoses. 5. Left subclavian artery atherosclerotic disease without significant stenosis. 6. Stable calcifications of the cavernous internal carotid arteries bilaterally without significant stenoses. 7. Stable moderate right P2 segment stenosis. 8. Multilevel degenerative changes of the cervical spine are stable. These results were called by telephone at the time of interpretation on 07/06/2018 at 1:16 pm to Dr. ZAMRoderic Palauwho verbally acknowledged these results. Electronically Signed   By: ChrSan MorelleD.   On: 07/06/2018 13:16   Ct Head Code Stroke Wo Contrast  Result Date: 07/06/2018 CLINICAL DATA:  Code stroke. Acute onset of left-sided weakness today. Dizziness. EXAM: CT HEAD WITHOUT CONTRAST TECHNIQUE: Contiguous axial images were obtained from the base of the skull through the vertex without intravenous contrast. COMPARISON:  CT head without contrast 05/19/2018. MRI brain 05/19/2018. FINDINGS: Brain: Remote lacunar infarcts of the basal ganglia and internal capsule are stable. Remote right MCA territory encephalomalacia is stable. No acute infarct, hemorrhage, or  mass lesion is present. The insular cortex is within normal limits bilaterally. No new cortical lesions are present. Remote lacunar infarcts are present in the cerebellum bilaterally. Brainstem is within normal limits. The ventricles are of normal size. No significant extraaxial fluid collection is present. Vascular: Dense atherosclerotic changes are present within the cavernous internal carotid arteries bilaterally and at the dural margin of both vertebral arteries. There is no significant hyperdense vessel. Skull: Calvarium is intact. No focal lytic or blastic lesions are present. Sinuses/Orbits: The paranasal sinuses and mastoid air cells are clear. Globes and orbits are within normal limits. ASPECTS ([facility] Early CT Score) - Ganglionic level infarction (caudate, lentiform nuclei, internal capsule, insula, M1-M3 cortex): 7/7 - Supraganglionic infarction (  M4-M6 cortex): 3/3 Total score (0-10 with 10 being normal): 10/10 IMPRESSION: 1. No acute intracranial abnormality. 2. Stable right MCA territory encephalomalacia without definite expansion of the infarct territory. 3. Remote lacunar infarcts of the basal ganglia and cerebellum bilaterally are stable. 4. Atherosclerosis. 5. ASPECTS is 10/10 These results were called by telephone at the time of interpretation on 07/06/2018 at 12:00 pm to Dr. Milton Ferguson , who verbally acknowledged these results. Electronically Signed   By: San Morelle M.D.   On: 07/06/2018 12:00    2D Echocardiogram  - Left ventricle: The cavity size was normal. Wall thickness was increased in a pattern of moderate LVH. Systolic function was vigorous. The estimated ejection fraction was in the range of 65% to 70%. Features are consistent with a pseudonormal left ventricular filling pattern, with concomitant abnormal relaxation and increased filling pressure (grade 2 diastolic dysfunction). - Aortic valve: Mildly calcified annulus. There was moderate to severe  regurgitation. - Mitral valve: There was mild to moderate regurgitation. - Left atrium: The atrium was severely dilated.  Impressions:  Technically difficult echo No parasternal views.  LV systolic function  The aortic insufficiency is at least moderate - severe in severity .     HISTORY OF PRESENT ILLNESS Kenzlei Runions is a 77 y.o. female with history of type 2 diabetes, stroke, seizures, hypercholesterolemia, coronary artery disease and carotid stenosis bilaterally.  Per daughter patient was doing well today 07/06/2018 at 61 AM when her granddaughter noted a sudden onset of left facial droop, dysarthria, weakness in the left side.  She called her mother and explained what was going on.  Patient was driven to [facility] where patient was evaluated by tele-neurology.  Patient was well within the window for TPA.  Patient had no contraindications and was administered TPA.  Patient was then transferred to [facility] ER.    At time of consult, per daughter she had improved significantly.  She was no longer dysarthric, her left facial droop had improved, and her left arm had become stronger, however her left leg is still slightly weak. Premorbid modified Rankin scale (mRS): 0. NIH stroke scale while at [facility] 2.  HOSPITAL COURSE Ms. Azia Toutant is a 77 y.o. female with history of type 2 diabetes, stroke, seizures, hypercholesterolemia, coronary artery disease and carotid stenosis bilaterally presenting with left facial droop, dysarthria, weakness in the left side. Received tPA 07/06/18 at 16 at [facility] and transferred to [facility].  Stroke:  Punctate R occipital infarct s/p IV tPA secondary to small vessel disease    Code Stroke CT head No acute stroke. R MCA encephalomalacia. Old BG and B cerebellar infarcts. Atherosclerosis. ASPECTS 10.     CTA head & neck no ELVO. L ICA 60% stenosis. Aortic arch, R ICA bifurcation, L subclavian atherosclerosis. R P2  stenosis. Multilevel degen spine changes.  CT perfusion no core infarct  MRI  Punctate R occipital infarct. Evolution of sm L superior frontal gyrus lacune since Aug 2019. Chronic R MCA encephalomalacia. Small vessel disease.   2D Echo  EF 65% to 70%. No SOE  HgbA1c 4.6  aspirin 81 mg daily and clopidogrel 75 mg daily prior to admission, now on aspirin 81 mg and Plavix 75 mg daily.  Continue at discharge   Therapy recommendations:   CIR  Disposition:   CIR  Hypertension  Stable  Home medications: Coreg 12.5 mg twice daily   Okay to resume blood pressure medicines at discharge  BP goal normotensive  Hyperlipidemia  Home meds:  lipitor 80, resumed in hospital  LDL 50, goal < 70  Continue statin at discharge  Diabetes type II  Documented history of diabetes and patient's medical record, however HgbA1c 4.6, will not include as a discharge diagnosis  Dysphagia  No hx  SLP recommend D3 nectar thick diet  Monitor  Seizure  On trileptal and keppra PTA  Hx seizure 1 yr ago felt to be d/t prior stroke, no weakness  08/2017 episode LOC and syncope. EEG neg  followed by Dr. Merlene Laughter  Check EEG  Other Stroke Risk Factors  Advanced age  Cigarette smoker, advised to stop smoking  Hx stroke/TIA ? 02/2017 - multifocal punctate diffusion-positive lesions following seizuresin left precentral gyrus, left corona radiata, and right thalamus unclear whether postictal findings are small infarcts secondary to extensive atherosclerotic disease. New onset seizures felt to be from prior stroke. Placed on dilantin.  Coronary artery disease, coronary stent  Migraines  Obstructive sleep apnea, not on CPAP at home  Aortic insufficiency  PSVT stenosis  Other Active Problems  Anemia, Hgb 10.2  Thrombocytopenia, PLTs 135  Hypokalemia, K 3.3 - supplement - recheck  Renal insufficiency Cr 1.04  Seizures on trileptal   DISCHARGE EXAM Blood pressure (!)  150/52, pulse 99, temperature 98.2 F (36.8 C), temperature source Oral, resp. rate 18, weight 62.2 kg, SpO2 95 %. Pleasant elderly lady currently not in distress. . Afebrile. Head is nontraumatic. Neck is supple without bruit.    Cardiac exam no murmur or gallop. Lungs are clear to auscultation. Distal pulses are well felt. Neurological Exam :  Awake alert oriented x 3 normal speech and language. Mild left lower face asymmetry. Tongue midline. No drift.mild left hemiparesis 4/5 strength Mild diminished fine finger movements on left. Orbits right over left upper extremity. Mild left grip weak.. Normal sensation . Normal coordination.  Discharge Diet  Regular thin liquids  DISCHARGE PLAN  Disposition:  Transfer to [place] for ongoing PT, OT and ST  aspirin 81 mg daily and clopidogrel 75 mg daily for secondary stroke prevention  Recommend ongoing risk factor control by Primary Care Physician at time of discharge from inpatient rehabilitation.  Follow-up Redmond School, MD in 2 weeks following discharge from rehab.  Follow-up in [facility] in 4 weeks following discharge from rehab, office to schedule an appointment.   45 minutes were spent preparing discharge.  Burnetta Sabin, MSN, APRN, ANVP-BC, AGPCNP-BC Advanced Practice Stroke Nurse [place] for Schedule & Pager information 07/09/2018 3:21 PM  I have personally examined this patient, reviewed notes, independently viewed imaging studies, participated in medical decision making and plan of care.ROS completed by me personally and pertinent positives fully documented  I have made any additions or clarifications directly to the above note. Agree with note above.    Antony Contras, MD Medical Director [facility] Pager: [phone] 07/09/2018 4:08 PM

## 2018-07-09 NOTE — Progress Notes (Signed)
PT Cancellation Note  Patient Details Name: Yi Falletta MRN: [id number] DOB: Jul 16, 1941   Cancelled Treatment:    Reason Eval/Treat Not Completed: Other (comment)  Attempted to work with patient however she was currently eating lunch; will attempt to try back if time/schedule allow.   Deniece Ree PT, DPT, CBIS  Supplemental Physical Therapist [facility]    Pager [phone] Acute Rehab Office [phone]

## 2018-07-09 NOTE — Care Management Important Message (Signed)
Important Message  Patient Details  Name: [name] MRN: [id number] Date of Birth: 1940/12/08   Medicare Important Message Given:  Yes    Kendrah Lovern P Shannell Mikkelsen 07/09/2018, 3:47 PM

## 2018-07-09 NOTE — Progress Notes (Signed)
Barbara Gong, RN  Rehab Admission Coordinator  Physical Medicine and Rehabilitation  PMR Pre-admission  Signed  Date of Service:  07/09/2018 2:38 PM       Related encounter: ED to Hosp-Admission (Current) from 07/06/2018 in [facility]      Signed         Show:Clear all [x] Manual[x] Template[x] Copied  Added by: [x] Barbara Gong, RN  [] Hover for details PMR Admission Coordinator Pre-Admission Assessment  Patient: [name] is an 77 y.o., female MRN: [id number] DOB: [birth year] Height:   Weight: 62.2 kg                                                                                                                                                  Insurance Information HMO: yes    PPO:      PCP:      IPA:      80/20:      OTHER: medicare advantage plan PRIMARY: [employer] Medicare      Policy#: [id number]      Subscriber: pt CM Name: [name]      Phone#: [phone]     Fax#: [phone] Pre-Cert#: [id number]      Employer: retired Benefits:  Phone #: [phone]     Name: 07/09/2018 Eff. Date: 09/08/2017     Deduct: none      Out of Pocket Max: $4400      Life Max: none CIR: $345 co pay per day days 1 until 5      SNF: no co pay days 1 until 20; $160 cop ay per day days 21 until 48; no co pay days 49 until 100 Outpatient: $40 co pay per visit     Co-Pay:  Visits per medical neccesity Home Health: 100%      Co-Pay: visits per medical neccesity DME: 80%     Co-Pay: 20% Providers: in network  SECONDARY: none        Medicaid Application Date:       Case Manager:  Disability Application Date:       Case Worker:   Emergency Contact Information         Contact Information    Name Relation Home Work Mobile   [name] Son [phone]  [phone]   [name], Barbara [phone]  [phone]   Barbara, [name] Daughter   [phone]     Current Medical History  Patient Admitting Diagnosis: right occipital lobe  infarct  History of Present Illness: [name] is a 77 year old right-handed female history of aortic insufficiency, PSVT, CAD with stenting maintained on aspirin and Plavix, hypertension, type 2 diabetes mellitus and seizure disorder maintained on Trileptal . Presented 07/06/2018 with acute onset of left facial droop dysarthria and left-sided weakness. Cranial CT scan reviewed, unremarkable for acute intracranial process. Per report, remote lacunar infarcts of the basal ganglia  and cerebellum bilaterally. Patient did receive TPA. CT angiogram of head and neck showed no emergent large vessel occlusion. 60% stenosis of left internal carotid artery. MRI showed punctate new white matter infarction in the anterior right occipital lobe. No hemorrhage or mass-effect. Echocardiogram with ejection fraction of 37% grade 2 diastolic dysfunction. EEG negative for seizure. Presently remains on aspirin and Plavix for CVA prophylaxis. Diet has been advanced to a regular consistency.   Complete NIHSS TOTAL: 0  Past Medical History      Past Medical History:  Diagnosis Date  . Anemia   . Aortic insufficiency    Moderate  . Back pain   . Carotid stenosis, bilateral   . Cirrhosis ([place])   . Closed left femoral fracture ([place]) 04/21/2017  . Coronary artery disease    Stent x 2 RCA 1995, cardiac catheterization 09/2016 showing only mild atherosclerosis  . DDD (degenerative disc disease), lumbar   . Essential hypertension   . Falls   . GERD (gastroesophageal reflux disease)   . Hiatal hernia   . History of stroke   . Hypercholesteremia   . IBS (irritable bowel syndrome)   . Non-alcoholic fatty liver disease   . OSA (obstructive sleep apnea)    no CPAP  . Pericardial effusion    a. small by echo 2018.  Marland Kitchen PSVT (paroxysmal supraventricular tachycardia) ([place])   . Recurrent UTI   . Seizures ([place])   . Stroke ([place])   . Type 2 diabetes mellitus ([place])    off  medication  . Varices, esophageal ([place])     Family History  family history includes Diabetes in her mother; Heart failure in her father and maternal aunt.  Prior Rehab/Hospitalizations:  Has the patient had major surgery during 100 days prior to admission? No  SNF for 3 months last year after hip surgery  Current Medications   Current Facility-Administered Medications:  .  acetaminophen (TYLENOL) tablet 650 mg, 650 mg, Oral, Q6H PRN, Donzetta Starch, NP, 650 mg at 07/09/18 0022 .  aspirin EC tablet 81 mg, 81 mg, Oral, Daily, Biby, Sharon L, NP, 81 mg at 07/09/18 1058 .  atorvastatin (LIPITOR) tablet 80 mg, 80 mg, Oral, Daily, Marliss Coots, PA-C, 80 mg at 07/09/18 1057 .  clopidogrel (PLAVIX) tablet 75 mg, 75 mg, Oral, Q breakfast, Biby, Sharon L, NP, 75 mg at 07/09/18 1057 .  Influenza vac split quadrivalent PF (FLUZONE HIGH-DOSE) injection 0.5 mL, 0.5 mL, Intramuscular, Prior to discharge, Greta Doom, MD .  LORazepam (ATIVAN) tablet 1 mg, 1 mg, Oral, Daily PRN, Marliss Coots, PA-C .  nicardipine (CARDENE) 70m in 0.86% saline 2050mIV infusion (0.1 mg/ml), 3-15 mg/hr, Intravenous, Continuous PRN, KiGreta DoomMD .  ondansetron ([facility] Hospital[facility] 4 mg, 4 mg, Intravenous, Q8H, Biby, Sharon L, NP, 4 mg at 07/09/18 0527 .  ondansetron (ZOFRAN) tablet 4 mg, 4 mg, Oral, Q8H PRN, LiKerney ElbeMD, 4 mg at 07/08/18 0928 .  Oxcarbazepine (TRILEPTAL) tablet 300 mg, 300 mg, Oral, BID, KiGreta DoomMD, 300 mg at 07/09/18 1058 .  RESOURCE THICKENUP CLEAR, , Oral, PRN, PiDavonna BellingMD .  senna-docusate (Senokot-S) tablet 1 tablet, 1 tablet, Oral, QHS PRN, SmMarliss CootsPA-C  Patients Current Diet:     Diet Order                  Diet regular Room service appropriate? Yes; Fluid consistency: Thin  Diet effective now  Precautions / Restrictions Precautions Precautions: Fall Restrictions Weight Bearing Restrictions:  No   Has the patient had 2 or more falls or a fall with injury in the past year?No  Prior Activity Level Limited Community (1-2x/wk): Mod I with RW at home  [place] / [place] Devices/Equipment: Gilford Rile (specify type) Home Equipment: Gilford Rile - 2 wheels, Wheelchair - manual, [employer] - single point, [employer], Bedside commode, Grab bars - tub/shower  Prior Device Use: Indicate devices/aids used by the patient prior to current illness, exacerbation or injury? Walker and wheelchair  Prior Functional Level Prior Function Level of Independence: Independent with assistive device(s) Gait / Transfers Assistance Needed: Primarily uses RW per son ADL's / Homemaking Assistance Needed: pt able to complete ADL with S of family; pt cooking dinner last week Comments: household ambulator with RW, uses wheelchair for community distances  Self Care: Did the patient need help bathing, dressing, using the toilet or eating?  Needed some help  Indoor Mobility: Did the patient need assistance with walking from room to room (with or without device)? Independent  Stairs: Did the patient need assistance with internal or external stairs (with or without device)? Needed some help  Functional Cognition: Did the patient need help planning regular tasks such as shopping or remembering to take medications? Needed some help  Current Functional Level Cognition  Arousal/Alertness: Awake/alert Overall Cognitive Status: Impaired/Different from baseline Current Attention Level: Selective Orientation Level: Oriented X4 Following Commands: Follows one step commands consistently, Follows one step commands with increased time, Follows multi-step commands inconsistently, Follows multi-step commands with increased time Safety/Judgement: Decreased awareness of deficits, Decreased awareness of safety Attention: Selective Selective Attention: Appears intact Memory: Impaired Memory  Impairment: Retrieval deficit, Prospective memory Awareness: Appears intact Executive Function: Reasoning Reasoning: Impaired Reasoning Impairment: Verbal complex Safety/Judgment: Appears intact    Extremity Assessment (includes Sensation/Coordination)  Upper Extremity Assessment: Generalized weakness, LUE deficits/detail LUE Deficits / Details: using functionally however movemetns are slow and intentional; decreased in-hand manipulation skills LUE Coordination: decreased fine motor  Lower Extremity Assessment: Defer to PT evaluation LLE Deficits / Details: noted LLE weakness with significant resting left ankle inversion and plantarflexion position LLE Coordination: decreased fine motor, decreased gross motor    ADLs  Overall ADL's : Needs assistance/impaired Eating/Feeding: Minimal assistance Eating/Feeding Details (indicate cue type and reason): nectar thick Grooming: Supervision/safety, Set up, Sitting Upper Body Bathing: Minimal assistance, Sitting Lower Body Bathing: Moderate assistance, Sit to/from stand Upper Body Dressing : Moderate assistance, Sitting Lower Body Dressing: Moderate assistance, Sit to/from stand Toilet Transfer: Minimal assistance Toileting- Clothing Manipulation and Hygiene: Moderate assistance, Sit to/from stand Functional mobility during ADLs: +2 for safety/equipment, Minimal assistance General ADL Comments: difficulty with initiation; very slow movements; requires physical assist to stand from chair(took @ 5 steps in 5 min)    Mobility  Overal bed mobility: Needs Assistance Bed Mobility: Supine to Sit Supine to sit: Supervision General bed mobility comments: S to complete transfer, extended time     Transfers  Overall transfer level: Needs assistance Equipment used: Rolling walker (2 wheeled) Transfers: Sit to/from Stand, [employer] Transfers Sit to Stand: Min guard Stand pivot transfers: Min guard General transfer comment: close min guard  for safety and steadying with functional transfers, cues for safe use of RW     Ambulation / Gait / Stairs / Wheelchair Mobility  Ambulation/Gait Ambulation/Gait assistance: Counsellor (Feet): 30 Feet Assistive device: Rolling walker (2 wheeled) Gait Pattern/deviations: Step-to pattern, Trunk flexed,  Decreased step length - left, Decreased stance time - left, Narrow base of support General Gait Details: very slow gait pattern with ongoing reduced step length, flexed at hips and continues to report L hip pain Gait velocity: non-functional speed Gait velocity interpretation: <1.31 ft/sec, indicative of household ambulator    Posture / Balance Dynamic Sitting Balance Sitting balance - Comments: able to take off socks/put on shoes and L LE brace at EOB  Balance Overall balance assessment: Needs assistance Sitting balance-Leahy Scale: Good Sitting balance - Comments: able to take off socks/put on shoes and L LE brace at EOB  Standing balance support: During functional activity Standing balance-Leahy Scale: Poor Standing balance comment: heavy reliance on B UE support     Special needs/care consideration BiPAP/CPAP n/a CPM n/a Continuous Drip IV n/a Dialysis n/a Life Vest n/a Oxygen n/a Special Bed n/a Trach Size n/a Wound Vac n/a Skin intact Bowel mgmt: continent LBM 10/31 Bladder mgmt: continent Diabetic mgmt yes Hgb A1c 4.6   Previous Home Environment Living Arrangements: (Her 38 year old grand daughter lives with her)  Lives With: (61 year old grand daughter lives with her; son, [name] lives) Available Help at Discharge: Available 24 hours/day(Robert states he will provide what ever is needed) Type of Home: House Home Layout: Multi-level, Two level Alternate Level Stairs-Rails: Right Alternate Level Stairs-Number of Steps: 12-15 steps to basement (patient states she does not go to basement) Home Access: Stairs to enter Entrance Stairs-Rails: Left Entrance  Stairs-Number of Steps: 2 Bathroom Shower/Tub: Chiropodist: Standard Bathroom Accessibility: Yes How Accessible: Accessible via walker Home Care Services: No  Discharge Living Setting Plans for Discharge Living Setting: Patient's home Type of Home at Discharge: House Discharge Home Layout: Multi-level, Laundry or work area in basement Alternate Level Stairs-Rails: Left Alternate Level Stairs-Number of Steps: 2 Discharge Home Access: Stairs to enter Entrance Stairs-Rails: Left Entrance Stairs-Number of Steps: 2 Discharge Bathroom Shower/Tub: Tub/shower unit, Curtain Discharge Bathroom Toilet: Standard Discharge Bathroom Accessibility: Yes How Accessible: Accessible via walker Does the patient have any problems obtaining your medications?: No  Social/Family/Support Systems Patient Roles: Parent Contact Information: [name], son Anticipated Caregiver: [name] and her grand daughter Anticipated Ambulance person Information: see above Ability/Limitations of Caregiver: [name] unemployed an dlives across the street; 33 year old grand daughter in school to become Therapist, sports and works as a cna at [employer] Caregiver Availability: 24/7 Discharge Plan Discussed with Primary Caregiver: Yes Is Caregiver In Agreement with Plan?: Yes Does Caregiver/Family have Issues with Lodging/Transportation while Pt is in Rehab?: No  Goals/Additional Needs Patient/Family Goal for Rehab: supervision with PT and OT Expected length of stay: ELOS 4 to 7 days Pt/Family Agrees to Admission and willing to participate: Yes Program Orientation Provided & Reviewed with Pt/Caregiver Including Roles  & Responsibilities: Yes  Decrease burden of Care through IP rehab admission: n/a  Possible need for SNF placement upon discharge: not expected  Patient Condition: This patient's medical and functional status remains as with the consult dated: 07/07/2018 in which the Rehabilitation Physician determined  and documented that the patient's condition is appropriate for intensive rehabilitative care in an inpatient rehabilitation facility but patient requested discharge directly home. I have reviewed with patient and her son benefits of an inpatient rehab admit, and they are now in agreement. .  Functional changes are: overall min assist. After evaluating the patient today and speaking with the Rehabilitation physician and acute team, the patient remains appropriate for inpatient rehab. Will admit to inpatient rehab  today.  Preadmission Screen Completed By:  Cleatrice Burke, 07/09/2018 2:38 PM ______________________________________________________________________   Discussed status with Dr. Naaman Plummer on 07/09/2018 at  1446 and received telephone approval for admission today.  Admission Coordinator:  Cleatrice Burke, time [id number] on 07/09/2018           Cosigned by: Meredith Staggers, MD at 07/09/2018 3:07 PM  Revision History

## 2018-07-09 NOTE — Progress Notes (Signed)
Physical Therapy Treatment Patient Details Name: [name] MRN: [id number] DOB: 07/27/1941 Today's Date: 07/09/2018    History of Present Illness  77 y.o. female with history of type 2 diabetes, stroke, seizures, hypercholesterolemia, coronary artery disease and carotid stenosis bilaterally presenting with left facial droop, dysarthria, weakness in the left side. Received tPA 07/06/18 at 1239. MRI revealed Punctate R occipital infarct     PT Comments    Patient received in bed, very pleasant and willing to participate in PT session today. Able to complete functional bed mobility with S and transfers/gait approximately 40f with min guard and significantly extended period of time today. Able to apply shoes and LE brace while sitting at EOB with S. She continues to demonstrate significant impairments in gait pattern and speed, balance skills, and remains a high fall risk at this time. Continue to recommend intensive therapies in the CIR setting moving forward.     Follow Up Recommendations  CIR     Equipment Recommendations  None recommended by PT    Recommendations for Other Services       Precautions / Restrictions Precautions Precautions: Fall Restrictions Weight Bearing Restrictions: No    Mobility  Bed Mobility Overal bed mobility: Needs Assistance Bed Mobility: Supine to Sit     Supine to sit: Supervision     General bed mobility comments: S to complete transfer, extended time   Transfers Overall transfer level: Needs assistance Equipment used: Rolling walker (2 wheeled) Transfers: Sit to/from SOmnicareSit to Stand: Min guard Stand pivot transfers: Min guard       General transfer comment: close min guard for safety and steadying with functional transfers, cues for safe use of RW   Ambulation/Gait Ambulation/Gait assistance: Min guard Gait Distance (Feet): 30 Feet Assistive device: Rolling walker (2 wheeled) Gait Pattern/deviations:  Step-to pattern;Trunk flexed;Decreased step length - left;Decreased stance time - left;Narrow base of support Gait velocity: non-functional speed   General Gait Details: very slow gait pattern with ongoing reduced step length, flexed at hips and continues to report L hip pain   Stairs             Wheelchair Mobility    Modified Rankin (Stroke Patients Only) Modified Rankin (Stroke Patients Only) Pre-Morbid Rankin Score: Slight disability Modified Rankin: Moderately severe disability     Balance Overall balance assessment: Needs assistance   Sitting balance-Leahy Scale: Good Sitting balance - Comments: able to take off socks/put on shoes and L LE brace at EOB      Standing balance-Leahy Scale: Poor Standing balance comment: heavy reliance on B UE support                             Cognition Arousal/Alertness: Awake/alert Behavior During Therapy: WFL for tasks assessed/performed Overall Cognitive Status: Impaired/Different from baseline Area of Impairment: Orientation;Attention;Memory;Following commands;Safety/judgement;Awareness;Problem solving                 Orientation Level: Person;Place;Time;Situation Current Attention Level: Selective Memory: Decreased short-term memory Following Commands: Follows one step commands consistently;Follows one step commands with increased time;Follows multi-step commands inconsistently;Follows multi-step commands with increased time Safety/Judgement: Decreased awareness of deficits;Decreased awareness of safety Awareness: Emergent Problem Solving: Slow processing;Decreased initiation;Requires verbal cues        Exercises      General Comments        Pertinent Vitals/Pain Pain Assessment: No/denies pain Faces Pain Scale: No hurt Pain Intervention(s): Limited activity within  patient's tolerance;Monitored during session    Home Living                      Prior Function            PT Goals  (current goals can now be found in the care plan section) Acute Rehab PT Goals Patient Stated Goal: to go home PT Goal Formulation: With patient Time For Goal Achievement: 07/21/18 Potential to Achieve Goals: Good Progress towards PT goals: Progressing toward goals    Frequency    Min 4X/week      PT Plan Current plan remains appropriate    Co-evaluation              AM-PAC PT "6 Clicks" Daily Activity  Outcome Measure  Difficulty turning over in bed (including adjusting bedclothes, sheets and blankets)?: A Little Difficulty moving from lying on back to sitting on the side of the bed? : A Little Difficulty sitting down on and standing up from a chair with arms (e.g., wheelchair, bedside commode, etc,.)?: A Little Help needed moving to and from a bed to chair (including a wheelchair)?: A Little Help needed walking in hospital room?: A Little Help needed climbing 3-5 steps with a railing? : A Lot 6 Click Score: 17    End of Session   Activity Tolerance: Patient tolerated treatment well Patient left: in chair;with call bell/phone within reach;with chair alarm set   PT Visit Diagnosis: Muscle weakness (generalized) (M62.81);Difficulty in walking, not elsewhere classified (R26.2)     Time: 5625-6389 PT Time Calculation (min) (ACUTE ONLY): 24 min  Charges:  $Gait Training: 8-22 mins $Therapeutic Activity: 8-22 mins                     Deniece Ree PT, DPT, CBIS  Supplemental Physical Therapist [place]    Pager [phone] Acute Rehab Office [phone]

## 2018-07-09 NOTE — Progress Notes (Signed)
Jamse Arn, MD    Jamse Arn, MD  Physician  Physical Medicine and Rehabilitation      Consult Note  Signed     Date of Service:  07/07/2018  1:54 PM         Related encounter: ED to Hosp-Admission (Current) from 07/06/2018 in [facility]             Signed          Expand All Collapse All            Expand widget buttonCollapse widget button    Show:Clear all   ManualTemplateCopied  Added by:     Angiulli, Lavon Paganini, PA-C  Jamse Arn, MD   Hover for detailscustomization button                                                                                                                                                                                                               untitled image              Physical Medicine and Rehabilitation Consult  Reason for Consult: Left-sided weakness and dysarthria  Referring Physician: Dr. Leonie Man     HPI: Savita Runner is a 77 y.o. right-handed female with history of aortic insufficiency, PSVT, CAD with stenting maintained on aspirin and Plavix, hypertension, type 2 diabetes mellitus and seizure disorder maintained on Trileptal and Keppra.  Per chart review and patient, patient lives alone.  2 level home.  2 steps to entry.  Independent with assistive device prior to admission.  She does use a wheelchair for longer distances.  Son lives next door that works.  Patient states that she plans to go home with her son who is currently not working.  Presented 07/06/2018 with acute onset of left facial droop dysarthria and left-sided weakness.  Cranial CT scan reviewed, unremarkable for acute intracranial process. Per report,  remote lacunar infarcts of the basal ganglia and cerebellum bilaterally.  Patient did receive TPA.  CT angios head and neck showed no emergent large vessel occlusion.  60% stenosis of the left internal carotid artery.  MRI showed punctate new white matter infarction in the anterior right occipital lobe.  No hemorrhage or mass-effect.  EEG and echocardiogram are pending.  Presently remains on aspirin and Plavix for CVA prophylaxis.  Dysphasia #3 nectar thick liquid diet.  Therapy evaluation completed with recommendations  of physical medicine rehab consult.        Review of Systems   Constitutional: Negative for chills and fever.   HENT: Negative for hearing loss.    Eyes: Negative for blurred vision and double vision.   Respiratory: Negative for cough and shortness of breath.    Cardiovascular: Negative for chest pain and leg swelling.   Gastrointestinal: Positive for constipation. Negative for nausea.        GERD   Genitourinary: Negative for dysuria and flank pain.   Musculoskeletal: Positive for back pain and falls.   Skin: Negative for rash.   Neurological: Positive for speech change, focal weakness and seizures.   All other systems reviewed and are negative.          Past Medical History:    Diagnosis   Date    .   Anemia        .   Aortic insufficiency            Moderate    .   Back pain        .   Carotid stenosis, bilateral        .   Cirrhosis ([place])        .   Closed left femoral fracture ([place])   04/21/2017    .   Coronary artery disease            Stent x 2 RCA 1995, cardiac catheterization 09/2016 showing only mild atherosclerosis    .   DDD (degenerative disc disease), lumbar        .   Essential hypertension        .   Falls        .   GERD (gastroesophageal reflux disease)        .   Hiatal hernia        .   History of stroke        .   Hypercholesteremia         .   IBS (irritable bowel syndrome)        .   Non-alcoholic fatty liver disease        .   OSA (obstructive sleep apnea)            no CPAP    .   Pericardial effusion            a. small by echo 2018.    Marland Kitchen   PSVT (paroxysmal supraventricular tachycardia) ([place])        .   Recurrent UTI        .   Seizures ([place])        .   Stroke ([place])        .   Type 2 diabetes mellitus ([place])            off medication    .   Varices, esophageal ([place])                 Past Surgical History:    Procedure   Laterality   Date    .   [place]    .   APPENDECTOMY            .   BACK SURGERY       1985    .   CARDIAC CATHETERIZATION       [phone]  Stent to the proximal RCA after MI     .   CARDIAC CATHETERIZATION   N/A   09/26/2016        Procedure: Left Heart Cath and Coronary Angiography;  Surgeon: Leonie Man, MD;  Location: [facility];  Service: Cardiovascular;  Laterality: N/A;    .   CATARACT EXTRACTION W/PHACO   Right   07/04/2014        Procedure: CATARACT EXTRACTION PHACO AND INTRAOCULAR LENS PLACEMENT (IOC);  Surgeon: Elta Guadeloupe T. Gershon Crane, MD;  Location: [facility];  Service: Ophthalmology;  Laterality: Right;  CDE:13.13    .   COLONOSCOPY            .   DILATION AND CURETTAGE OF UTERUS                x2    .   Epi Retinal Membrane Peel   Left        .   ERCP            .   ESOPHAGEAL BANDING   N/A   04/01/2013        Procedure: ESOPHAGEAL BANDING;  Surgeon: Rogene Houston, MD;  Location: [facility];  Service: Endoscopy;  Laterality: N/A;    .   ESOPHAGEAL BANDING   N/A   05/24/2013        Procedure: ESOPHAGEAL BANDING;  Surgeon: Rogene Houston, MD;  [facility];  Service: Endoscopy;  Laterality: N/A;    .   ESOPHAGEAL BANDING    N/A   06/21/2014        Procedure: ESOPHAGEAL BANDING;  Surgeon: Rogene Houston, MD;  [facility];  Service: Endoscopy;  Laterality: N/A;    .   ESOPHAGOGASTRODUODENOSCOPY   N/A   04/01/2013        Procedure: ESOPHAGOGASTRODUODENOSCOPY (EGD);  Surgeon: Rogene Houston, MD;  [facility];  Service: Endoscopy;  Laterality: N/A;  230-rescheduled to 8:30am Ann notified pt    .   ESOPHAGOGASTRODUODENOSCOPY   N/A   05/24/2013        Procedure: ESOPHAGOGASTRODUODENOSCOPY (EGD);  Surgeon: Rogene Houston, MD;  [facility];  Service: Endoscopy;  Laterality: N/A;  730    .   ESOPHAGOGASTRODUODENOSCOPY   N/A   06/21/2014        Procedure: ESOPHAGOGASTRODUODENOSCOPY (EGD);  Surgeon: Rogene Houston, MD;  [facility];  Service: Endoscopy;  Laterality: N/A;  930-rescheduled 10/14 @ 1200 Ann to notify pt    .   EYE SURGERY       08        cataract surgery of the left eye    .   FEMUR IM NAIL   Left   03/02/2017        Procedure: INTRAMEDULLARY (IM) NAIL FEMORAL;  Surgeon: Rod Can, MD;  Location: [facility];  Service: Orthopedics;  Laterality: Left;    .   HARDWARE REMOVAL   Right   01/17/2013        Procedure: REMOVAL OF HARDWARE AND EXCISION ULNAR STYLOID RIGHT WRIST;  Surgeon: Tennis Must, MD;  Location: [facility];  Service: Orthopedics;  Laterality: Right;    .   MYRINGOTOMY       2012        both ears    .   ORIF FEMUR FRACTURE   Left   04/23/2017  Procedure: OPEN REDUCTION INTERNAL FIXATION (ORIF) DISTAL FEMUR FRACTURE (FRACTURE AROUND FEMORAL NAIL);  Surgeon: Altamese Skyline View, MD;  Location: [facility];  Service: Orthopedics;  Laterality: Left;    .   TONSILLECTOMY            .   VAGINAL HYSTERECTOMY       1972    .   WRIST SURGERY                rt wrist hardwear removal             Family History     Problem   Relation   Age of Onset    .   Diabetes   Mother        .   Heart failure   Father        .   Heart failure   Maternal Aunt           Social History:  reports that she has been smoking cigarettes. She has a 12.50 pack-year smoking history. She has never used smokeless tobacco. She reports that she does not drink alcohol or use drugs.  Allergies:         Allergies    Allergen   Reactions    .   Tape   Rash and Other (See Comments)            Please use paper tape              Medications Prior to Admission    Medication   Sig   Dispense   Refill    .   aspirin EC 81 MG tablet   Take 81 mg by mouth daily.            Marland Kitchen   aspirin-acetaminophen-caffeine (EXCEDRIN MIGRAINE) 250-250-65 MG tablet   Take 2 tablets by mouth as needed for headache.            Marland Kitchen   atorvastatin (LIPITOR) 80 MG tablet   Take 1 tablet (80 mg total) by mouth daily.   30 tablet   1    .   carvedilol (COREG) 12.5 MG tablet   Take 1 tablet (12.5 mg total) by mouth 2 (two) times daily with a meal.   60 tablet   1    .   ciprofloxacin (CIPRO) 500 MG tablet   Take 1 tablet by mouth 2 (two) times daily.            .   clopidogrel (PLAVIX) 75 MG tablet   Take 1 tablet (75 mg total) by mouth daily with breakfast.   30 tablet   0    .   HYDROcodone-acetaminophen (NORCO) 10-325 MG tablet   Take 1 tablet by mouth 4 (four) times daily as needed.            Marland Kitchen   LORazepam (ATIVAN) 1 MG tablet   Take 1 tablet by mouth daily as needed.            .   Multiple Vitamins-Minerals (CENTRUM ADULTS) TABS   Take 1 tablet by mouth daily.            .   niacin (NIASPAN) 500 MG CR tablet   Take 1 tablet by mouth daily.            Marland Kitchen   omeprazole (PRILOSEC) 20 MG capsule   Take 1 capsule (20 mg total) by mouth daily.   90 capsule  3    .   ondansetron (ZOFRAN)  4 MG tablet   Take 1 tablet (4 mg total) by mouth every 8 (eight) hours as needed for nausea or vomiting.   30 tablet   1    .   Oxcarbazepine (TRILEPTAL) 300 MG tablet   Take 1 tablet by mouth daily.            .   ranitidine (ZANTAC) 150 MG tablet   Take 1 tablet by mouth 2 (two) times daily as needed.            Marland Kitchen   tiZANidine (ZANAFLEX) 4 MG tablet   Take 1 tablet by mouth at bedtime.             .   levETIRAcetam (KEPPRA) 500 MG tablet   Take 1 tablet (500 mg total) by mouth 2 (two) times daily. (Patient not taking: Reported on 07/06/2018)   60 tablet   0          Home:  Home Living  Family/patient expects to be discharged to:: Private residence  Living Arrangements: Other relatives  Available Help at Discharge: Family  Type of Home: House  Home Access: Stairs to enter  Technical brewer of Steps: 2  Entrance Stairs-Rails: Left  Home Layout: Multi-level, Two level  Alternate Level Stairs-Number of Steps: 12-15 steps to basement (patient states she does not go to basement)  Alternate Level Stairs-Rails: Right  Bathroom Accessibility: Yes  Home Equipment: Environmental consultant - 2 wheels, Wheelchair - manual, [employer] - single point, Shower seat, Bedside commode   Functional History:  Prior Function  Level of Independence: Independent with assistive device(s)  Comments: household ambulator with RW, uses wheelchair for community distances  Functional Status:   Mobility:  Bed Mobility  Overal bed mobility: Needs Assistance  Bed Mobility: Supine to Sit  Supine to sit: Min assist  General bed mobility comments: significant amount of time to come to EOB, min assist with line management and multi modal cues for positioning  Transfers  Overall transfer level: Needs assistance  Equipment used: Rolling walker (2 wheeled)  Transfers: Sit to/from Stand  Sit to Stand: Min assist, +2 safety/equipment  General transfer  comment: Min assist to power to upright, significant amount of time with noted motor processing delays. Physical assist required to carry out functional movement of power up and transition  Ambulation/Gait  Ambulation/Gait assistance: Min assist  Gait Distance (Feet): 4 Feet  Assistive device: Rolling walker (2 wheeled)  Gait Pattern/deviations: Step-to pattern, Trunk flexed  General Gait Details: non-functional gait at this time, Max multi modal cues for initiation and performance. continued motor processing deficits noted  Gait velocity: non-functional speed  Gait velocity interpretation: <1.31 ft/sec, indicative of household ambulator       ADL:       Cognition:  Cognition  Overall Cognitive Status: Impaired/Different from baseline  Orientation Level: Oriented X4  Cognition  Arousal/Alertness: Awake/alert  Behavior During Therapy: WFL for tasks assessed/performed  Overall Cognitive Status: Impaired/Different from baseline  Area of Impairment: Orientation, Attention, Memory, Following commands, Safety/judgement, Awareness, Problem solving  Orientation Level: Disoriented to, Situation  Current Attention Level: Sustained  Memory: Decreased short-term memory  Following Commands: Follows one step commands with increased time  Safety/Judgement: Decreased awareness of deficits  Awareness: Emergent  Problem Solving: Slow processing, Decreased initiation, Requires verbal cues     Blood pressure (!) 158/63, pulse 90, temperature 99.5 F (37.5 C), temperature source Oral, resp. rate Marland Kitchen)  23, weight 62.2 kg, SpO2 97 %.  Physical Exam   Vitals reviewed.  Constitutional: She appears well-developed and well-nourished.   HENT:   Head: Normocephalic and atraumatic.   Eyes: EOM are normal. Right eye exhibits no discharge. Left eye exhibits no discharge.   Neck: Normal range of motion. Neck supple. No thyromegaly present.   Cardiovascular: Normal rate and  regular rhythm.   Respiratory: Effort normal and breath sounds normal. No respiratory distress.   GI: Soft. Bowel sounds are normal. She exhibits no distension.  Musculoskeletal: She exhibits deformity (Congenital left equinovarus).  No edema or tenderness in extremities  Neurological: She is alert.  Follows commands.   Makes good eye contact with examiner.   Provides her name and age and date of birth. Motor: Bilateral upper extremities, right lower extremity: 4+-5/5 proximal distal Left lower extremity: 4+/5, except for left equinovarus congenital deformity (baseline)   Skin: Skin is warm and dry.  Psychiatric: She has a normal mood and affect. Her behavior is normal. Thought content normal.         Lab Results Last 24 Hours                                                                                                                                                                                                         Imaging Results (Last 48 hours)                                                            Assessment/Plan:  Diagnosis: Anterior right occipital lobe infarct  Labs and images (see above) independently reviewed.  Records reviewed and summated above.     1.Does the need for close, 24 hr/day medical supervision in concert with the patient's rehab needs make it unreasonable for this patient to be served in a less intensive setting? Potentially    2.Co-Morbidities requiring supervision/potential complications: aortic insufficiency (Monitor in accordance with increased physical activity and avoid UE resistance excercises), PSVT (monitor HR with increased physical activity), CAD (cont meds), HTN (monitor and provide prns in accordance with increased physical exertion and pain),  type 2 DM (Monitor in accordance with exercise and adjust meds as necessary), seizure (consider meds), tachypnea (monitor RR and O2 Sats with increased physical exertion), abx (consider change in Cipro due to seizure threshold), hypokalemia (continue to monitor and replete as necessary),  post-stroke dysphagia (advance diet as tolerated)   3.Due to safety, disease management and patient education, does the patient require 24 hr/day rehab nursing? Potentially   4.Does the patient require coordinated care of a physician, rehab nurse, PT (1-2 hrs/day, 5 days/week), OT (1-2 hrs/day, 5 days/week) and SLP (1-2 hrs/day, 5 days/week) to address physical and functional deficits in the context of the above medical diagnosis(es)? Yes Addressing deficits in the following areas: balance, endurance, locomotion, strength, transferring, bathing, dressing, toileting and psychosocial support   5.Can the patient actively participate in an intensive therapy program of at least 3 hrs of therapy per day at least 5 days per week? Yes   6.The potential for patient to make measurable gains while on inpatient rehab is excellent   7.Anticipated functional outcomes upon discharge from inpatient rehab are supervision  with PT, supervision with OT, n/a with SLP.   8.Estimated rehab length of stay to reach the above functional goals is: 4-7 days.   9.Anticipated D/C setting: Home   10.Anticipated post D/C treatments: HH therapy and Home excercise program   11.Overall Rehab/Functional Prognosis: good      RECOMMENDATIONS:  This patient's condition is appropriate for continued rehabilitative care in the following setting: Patient states that she was told she is going home tomorrow morning.  Given current deficits and caregiver availablility, believe this is reasonable.  Patient does not prefer CIR.  Recommend outpatient PM&R follow up.  Patient has agreed to participate in recommended program. Potentially  Note  that insurance prior authorization may be required for reimbursement for recommended care.     Comment: Rehab Admissions Coordinator to follow up.     I have personally performed a face to face diagnostic evaluation, including, but not limited to relevant history and physical exam findings, of this patient and developed relevant assessment and plan.  Additionally, I have reviewed and concur with the physician assistant's documentation above.      Delice Lesch, MD, ABPMR  Lavon Paganini Angiulli, PA-C  07/07/2018                Revision History                                        Routing History

## 2018-07-09 NOTE — Progress Notes (Signed)
Inpatient Rehabilitation Admissions Coordinator  I have insurance approval to admit pt to inpt rehab today. I met with pt at bedside and spoke with her son by phone . RN CM and SW made aware. I contacted Sharon Biby, GNP and will make the arrangements to admit today.  Barbara Boyette, RN, MSN Rehab Admissions Coordinator [phone] 07/09/2018 2:32 PM  

## 2018-07-09 NOTE — Progress Notes (Signed)
Inpatient Rehabilitation Admissions Coordinator  I met with pt at bedside to discuss goals and expectations of an inpt rehab admit. She is in agreement. I await insurance approval for a possible inpt rehab admit today.  Danne Baxter, RN, MSN Rehab Admissions Coordinator [phone] 07/09/2018 8:40 AM]

## 2018-07-09 NOTE — Progress Notes (Signed)
Pt being discharged from 3W and being transferred to [facility] per orders from MD. Pt educated on discharge instructions. Pt verbalized understanding of instructions. All questions and concerns were addressed. Pt transferred to 4W19 via hospital bed by staff.

## 2018-07-10 ENCOUNTER — Inpatient Hospital Stay (HOSPITAL_COMMUNITY): Payer: Self-pay | Admitting: Physical Therapy

## 2018-07-10 ENCOUNTER — Inpatient Hospital Stay (HOSPITAL_COMMUNITY): Payer: Self-pay

## 2018-07-10 DIAGNOSIS — I739 Peripheral vascular disease, unspecified: Secondary | ICD-10-CM

## 2018-07-10 MED ORDER — ADULT MULTIVITAMIN W/MINERALS CH
1.0000 | ORAL_TABLET | Freq: Every day | ORAL | Status: DC
  Administered 2018-07-11 – 2018-07-15 (×5): 1 via ORAL
  Filled 2018-07-10 (×5): qty 1

## 2018-07-10 NOTE — Progress Notes (Signed)
Reports not sleeping good last night, reports "aching" pain to left hip.She had Surgery 1st of year. PRN tylenol given at 0400, per patient, might need something stronger. Continent. Getting up to [facility]. [name]

## 2018-07-10 NOTE — Progress Notes (Signed)
Patient ID: [name], female   DOB: 10/29/1940, 77 y.o.   MRN: [id number]   Prabhjot Maddux is a 77 y.o. female admitted for CIR with functional deficits with left-sided weakness and dysarthria.  Brain MRI revealed new white matter infarction in the anterior right occipital lobe.  Past Medical History:  Diagnosis Date  . Anemia   . Aortic insufficiency    Moderate  . Back pain   . Carotid stenosis, bilateral   . Cirrhosis ([place])   . Closed left femoral fracture ([place]) 04/21/2017  . Coronary artery disease    Stent x 2 RCA 1995, cardiac catheterization 09/2016 showing only mild atherosclerosis  . DDD (degenerative disc disease), lumbar   . Essential hypertension   . Falls   . GERD (gastroesophageal reflux disease)   . Hiatal hernia   . History of stroke   . Hypercholesteremia   . IBS (irritable bowel syndrome)   . Non-alcoholic fatty liver disease   . OSA (obstructive sleep apnea)    no CPAP  . Pericardial effusion    a. small by echo 2018.  Marland Kitchen PSVT (paroxysmal supraventricular tachycardia) ([place])   . Recurrent UTI   . Seizures ([place])   . Stroke ([place])   . Type 2 diabetes mellitus ([place])    off medication  . Varices, esophageal ([facility])      Subjective: No new complaints. No new problems.  Alert without concerns  Objective: Vital signs in last 24 hours: Temp:  [98.4 F (36.9 C)-99.2 F (37.3 C)] 98.4 F (36.9 C) (11/02 0407) Pulse Rate:  [91-105] 91 (11/02 0407) Resp:  [18-20] 18 (11/02 0407) BP: (164-180)/(50-63) 177/62 (11/02 0407) SpO2:  [97 %-98 %] 97 % (11/02 0407) Weight:  [58.2 kg] 58.2 kg (11/01 1752) Weight change:  Last BM Date: 07/09/18(per patient)  Intake/Output from previous day: 11/01 0701 - 11/02 0700 In: 240 [P.O.:240] Out: -    Physical Exam General: No apparent distress thin HEENT: Unremarkable Lungs: Normal effort. Lungs clear to auscultation, no crackles or wheezes. Cardiovascular: Regular rate and rhythm, no edema Abdomen: S/NT/ND;  BS(+) Musculoskeletal:  unchanged Neurological: No new neurological deficits with mild left-sided weakness Extremities.  No edema Skin: clear   Mental state: Alert, oriented, cooperative    Lab Results: BMET    Component Value Date/Time   NA 140 07/08/2018 0438   K 3.5 07/08/2018 0438   CL 108 07/08/2018 0438   CO2 25 07/08/2018 0438   GLUCOSE 115 (H) 07/08/2018 0438   BUN <5 (L) 07/08/2018 0438   CREATININE 0.70 07/08/2018 0438   CREATININE 0.78 01/12/2018 1310   CALCIUM 8.5 (L) 07/08/2018 0438   CALCIUM 8.4 (L) 04/22/2017 0514   GFRNONAA >60 07/08/2018 0438   GFRNONAA 74 01/12/2018 1310   GFRAA >60 07/08/2018 0438   GFRAA 86 01/12/2018 1310   CBC    Component Value Date/Time   WBC 9.2 07/08/2018 0438   RBC 3.86 (L) 07/08/2018 0438   HGB 12.0 07/08/2018 0438   HCT 36.2 07/08/2018 0438   PLT 132 (L) 07/08/2018 0438   MCV 93.8 07/08/2018 0438   MCH 31.1 07/08/2018 0438   MCHC 33.1 07/08/2018 0438   RDW 13.8 07/08/2018 0438   LYMPHSABS 2.2 07/06/2018 1134   MONOABS 0.5 07/06/2018 1134   EOSABS 0.2 07/06/2018 1134   BASOSABS 0.0 07/06/2018 1134    Lab Results  Component Value Date   HGBA1C 4.6 (L) 07/07/2018     Medications: I have reviewed the patient's current medications.  Assessment/Plan:  Functional deficits with left-sided weakness and dysarthria secondary to right anterior occipital stroke secondary to small vessel disease.  Status post TPA.  Continue CIR  DVT prophylaxis.  Continue SCDs Type 2 diabetes. stable  Length of stay, days: 1  Marletta Lor , MD 07/10/2018, 11:37 AM

## 2018-07-10 NOTE — Progress Notes (Signed)
Nutrition Brief Note  Patient identified on the Malnutrition Screening Tool (MST) Report  77 y.o. female with history of type 2 diabetes, stroke, seizures, hypercholesterolemia, coronary artery disease and carotid stenosis bilaterally presenting with left facial droop, dysarthria, weakness in the left side. Received tPA 07/06/18 at 35 at [facility] and transferred to [facility]. Pt found to have R occipital infarct now admitted for CIR   Met with pt in room today. Pt eating lunch at time of RD visit. Pt reports good appetite and oral intake; reports "I am eating everything I can get my hands on". Pt documented to be eating 75-100% of meals and drinking 2 Ensure per day. Pt denies any trouble chewing or swallowing. Per chart, pt is weight stable pta. RD will add MVI to help pt meet her estimated needs. Continue Ensure; can switch to Glucerna if blood sugars elevated.   Wt Readings from Last 15 Encounters:  07/09/18 58.2 kg  07/06/18 62.2 kg  05/19/18 64.9 kg  05/13/18 59 kg  01/12/18 58.9 kg  01/11/18 62.6 kg  10/09/17 56.7 kg  09/10/17 56.7 kg  09/04/17 56.7 kg  08/26/17 61.1 kg  08/18/17 57.2 kg  08/04/17 60.7 kg  07/06/17 58.1 kg  04/23/17 59.4 kg  03/23/17 59.4 kg    Body mass index is 22.72 kg/m. Patient meets criteria for normal weight based on current BMI. Pt is noted to have some mild muscle depletions in her clavicles and temporal regions.    Current diet order is regular, patient is consuming approximately 75-100% of meals at this time. Labs and medications reviewed.   No nutrition interventions warranted at this time. If nutrition issues arise, please consult RD.   Koleen Distance MS, RD, LDN Pager #- [phone] Office#- [phone] After Hours Pager: [phone]

## 2018-07-10 NOTE — Evaluation (Signed)
Occupational Therapy Assessment and Plan  Patient Details  Name: [name] MRN: [id number] Date of Birth: August 12, 1941  OT Diagnosis: abnormal posture, disturbance of vision, hemiplegia affecting non-dominant side and muscle weakness (generalized) Rehab Potential:   ELOS: 7-10   Today's Date: 07/10/2018 OT Individual Time: 0700-0800 OT Individual Time Calculation (min): 60 min     Problem List:  Patient Active Problem List   Diagnosis Date Noted  . Tobacco use disorder 07/09/2018  . Small vessel disease ([place]) 07/09/2018  . Benign essential HTN   . Seizures ([place])   . Tachypnea   . Dysphagia, post-stroke   . Dysarthria 05/19/2018  . Syncope and collapse   . Syncope 08/24/2017  . Closed left femoral fracture ([place]) 04/21/2017  . Seizure disorder ([place]) 04/21/2017  . Closed pertrochanteric fracture of femur, left, initial encounter ([place]) 03/02/2017  . Seizure ([place]) 02/28/2017  . Hip fracture ([place]) 02/28/2017  . Chest pain 12/07/2016  . Atypical chest pain 12/07/2016  . Tachycardia   . NSTEMI (non-ST elevated myocardial infarction) ([place]) 11/02/2016  . Multifocal atrial tachycardia ([place]) 09/25/2016  . Elevated troponin 09/24/2016  . Hypokalemia 09/23/2016  . TIA (transient ischemic attack) 05/05/2016  . Intractable nausea and vomiting 10/15/2015  . Acute coronary syndrome ([place]) 06/01/2015  . Bronchitis 06/01/2015  . Thrombocytopenia ([place]) 06/01/2015  . Cerebral infarction ([place]) right occipital due to small vessel disease s/p tPA 03/06/2014  . Lower urinary tract infectious disease   . PSVT (paroxysmal supraventricular tachycardia) ([place]) 12/09/2013  . Esophageal varices ([place]) 03/29/2013  . Hematemesis 03/29/2013  . Hepatic cirrhosis ([place]) 03/29/2013  . Presence of stent in right coronary artery 07/04/2011  . OSA (obstructive sleep apnea) 07/04/2011  . Aortic sclerosis 07/04/2011  . Aortic insufficiency 07/04/2011  . HTN (hypertension) 07/04/2011  . Carotid stenosis,  bilateral 07/04/2011  . Chest pain at rest 07/03/2011  . CAD (coronary artery disease) 07/03/2011  . Hypercholesteremia 07/03/2011  . GERD (gastroesophageal reflux disease) 07/03/2011    Past Medical History:  Past Medical History:  Diagnosis Date  . Anemia   . Aortic insufficiency    Moderate  . Back pain   . Carotid stenosis, bilateral   . Cirrhosis ([place])   . Closed left femoral fracture ([place]) 04/21/2017  . Coronary artery disease    Stent x 2 RCA 1995, cardiac catheterization 09/2016 showing only mild atherosclerosis  . DDD (degenerative disc disease), lumbar   . Essential hypertension   . Falls   . GERD (gastroesophageal reflux disease)   . Hiatal hernia   . History of stroke   . Hypercholesteremia   . IBS (irritable bowel syndrome)   . Non-alcoholic fatty liver disease   . OSA (obstructive sleep apnea)    no CPAP  . Pericardial effusion    a. small by echo 2018.  Marland Kitchen PSVT (paroxysmal supraventricular tachycardia) ([place])   . Recurrent UTI   . Seizures ([place])   . Stroke ([place])   . Type 2 diabetes mellitus ([place])    off medication  . Varices, esophageal (HCC)    Past Surgical History:  Past Surgical History:  Procedure Laterality Date  . [place]  . APPENDECTOMY    . BACK SURGERY  1985  . CARDIAC CATHETERIZATION  [phone]   Stent to the proximal RCA after MI   . CARDIAC CATHETERIZATION N/A 09/26/2016   Procedure: Left Heart Cath and Coronary Angiography;  Surgeon: Leonie Man, MD;  Location: [facility];  Service: Cardiovascular;  Laterality: N/A;  . CATARACT EXTRACTION W/PHACO Right 07/04/2014   Procedure: CATARACT EXTRACTION PHACO AND INTRAOCULAR LENS PLACEMENT (IOC);  Surgeon: Elta Guadeloupe T. Gershon Crane, MD;  Location: [facility];  Service: Ophthalmology;  Laterality: Right;  CDE:13.13  . COLONOSCOPY    . DILATION AND CURETTAGE OF UTERUS     x2  . Epi Retinal Membrane Peel Left   . ERCP    . ESOPHAGEAL BANDING N/A 04/01/2013   Procedure:  ESOPHAGEAL BANDING;  Surgeon: Rogene Houston, MD;  Location: [facility];  Service: Endoscopy;  Laterality: N/A;  . ESOPHAGEAL BANDING N/A 05/24/2013   Procedure: ESOPHAGEAL BANDING;  Surgeon: Rogene Houston, MD;  [facility];  Service: Endoscopy;  Laterality: N/A;  . ESOPHAGEAL BANDING N/A 06/21/2014   Procedure: ESOPHAGEAL BANDING;  Surgeon: Rogene Houston, MD;  [facility];  Service: Endoscopy;  Laterality: N/A;  . ESOPHAGOGASTRODUODENOSCOPY N/A 04/01/2013   Procedure: ESOPHAGOGASTRODUODENOSCOPY (EGD);  Surgeon: Rogene Houston, MD;  [facility];  Service: Endoscopy;  Laterality: N/A;  230-rescheduled to 8:30am Ann notified pt  . ESOPHAGOGASTRODUODENOSCOPY N/A 05/24/2013   Procedure: ESOPHAGOGASTRODUODENOSCOPY (EGD);  Surgeon: Rogene Houston, MD;  [facility];  Service: Endoscopy;  Laterality: N/A;  730  . ESOPHAGOGASTRODUODENOSCOPY N/A 06/21/2014   Procedure: ESOPHAGOGASTRODUODENOSCOPY (EGD);  Surgeon: Rogene Houston, MD;  [facility];  Service: Endoscopy;  Laterality: N/A;  930-rescheduled 10/14 @ 1200 Ann to notify pt  . EYE SURGERY  08   cataract surgery of the left eye  . FEMUR IM NAIL Left 03/02/2017   Procedure: INTRAMEDULLARY (IM) NAIL FEMORAL;  Surgeon: Rod Can, MD;  Location: [facility];  Service: Orthopedics;  Laterality: Left;  . HARDWARE REMOVAL Right 01/17/2013   Procedure: REMOVAL OF HARDWARE AND EXCISION ULNAR STYLOID RIGHT WRIST;  Surgeon: Tennis Must, MD;  Location: [facility];  Service: Orthopedics;  Laterality: Right;  . MYRINGOTOMY  2012   both ears  . ORIF FEMUR FRACTURE Left 04/23/2017   Procedure: OPEN REDUCTION INTERNAL FIXATION (ORIF) DISTAL FEMUR FRACTURE (FRACTURE AROUND FEMORAL NAIL);  Surgeon: Altamese [place], MD;  Location: [facility];  Service: Orthopedics;  Laterality: Left;  . TONSILLECTOMY    . VAGINAL HYSTERECTOMY  1972  . WRIST SURGERY     rt wrist hardwear removal     Assessment & Plan Clinical Impression: 77 y.o. female with history of type 2 diabetes, stroke, seizures, hypercholesterolemia, coronary artery disease and carotid stenosis bilaterally presenting with left facial droop, dysarthria, weakness in the left side. Received tPA 07/06/18 at 1239. MRI revealed Punctate R occipital infarct   Patient currently requires min with basic self-care skills secondary to muscle weakness, decreased cardiorespiratoy endurance, decreased coordination, decreased attention to left, decreased awareness, decreased safety awareness and decreased memory and decreased sitting balance, decreased standing balance and decreased balance strategies.  Prior to hospitalization, patient could complete BADL with S- modified independent .  Patient will benefit from skilled intervention to decrease level of assist with basic self-care skills and increase independence with basic self-care skills prior to discharge home with care partner.  Anticipate patient will require intermittent supervision  and follow up home health.  OT - End of Session Activity Tolerance: Tolerates 30+ min activity with multiple rests Endurance Deficit: Yes OT Assessment Rehab Potential (ACUTE ONLY): Good OT Patient demonstrates impairments in the following area(s): Balance;Cognition;Endurance;Motor;Perception;Safety;Vision OT Basic ADL's Functional Problem(s): Grooming;Bathing;Dressing;Toileting;Eating OT Advanced ADL's Functional Problem(s): Simple Meal Preparation OT Transfers Functional Problem(s):  Toilet;Tub/Shower OT Additional Impairment(s): Fuctional Use of Upper Extremity OT Plan OT Intensity: Minimum of 1-2 x/day, 45 to 90 minutes OT Frequency: 5 out of 7 days OT Duration/Estimated Length of Stay: 7-10 OT Treatment/Interventions: Discharge planning;Balance/vestibular training;Cognitive remediation/compensation;Community reintegration;Disease mangement/prevention;DME/adaptive equipment  instruction;Functional mobility training;Neuromuscular re-education;Pain management;Patient/family education;Psychosocial support;Self Care/advanced ADL retraining;Skin care/wound managment;Therapeutic Activities;Therapeutic Exercise;UE/LE Strength taining/ROM;UE/LE Coordination activities;Visual/perceptual remediation/compensation OT Self Feeding Anticipated Outcome(s): MOD I OT Basic Self-Care Anticipated Outcome(s): MOD I OT Toileting Anticipated Outcome(s): MOD I OT Bathroom Transfers Anticipated Outcome(s): MOD I OT Recommendation Recommendations for Other Services: Therapeutic Recreation consult Therapeutic Recreation Interventions: Pet therapy;Kitchen group;Outing/community reintergration Patient destination: Home Follow Up Recommendations: Home health OT Equipment Recommended: To be determined Equipment Details: likely none   Skilled Therapeutic Intervention 1;1. Pt received seated in bed no complaints of pain. Pt positioned for improved swallow in bed while eating breakfast with HOB elevated more. Pt educated on role/purpse of OT, CIR ELOS and POC. Pt bathes, toileting and dresses at shower level with CGA-MIN A for standing balance and all transfers with VC for reach back, grab bar use and hand placement. Pt with posterior bias in standing requiring VC for anterior weight shift and scooting to edge of chair prior to sit to stand. Pt demo mild L inattention throughout session, however able to locate items on sink on L side during grooming. Exited session with pt seated in bed, call light in reach and exit alarm on.  OT Evaluation Precautions/Restrictions  Precautions Precautions: Fall Restrictions Weight Bearing Restrictions: No General Chart Reviewed: Yes Vital Signs Therapy Vitals Temp: 98.4 F (36.9 C) Pulse Rate: 91 Resp: 18 BP: (!) 177/62 Patient Position (if appropriate): Lying Oxygen Therapy SpO2: 97 % O2 Device: Room Air Pain Pain Assessment Pain Scale:  0-10 Pain Score: 0-No pain Pain Type: Chronic pain Pain Location: Hip Pain Orientation: Left Pain Descriptors / Indicators: Aching Pain Frequency: Intermittent Pain Onset: On-going Pain Intervention(s): Medication (See eMAR) Multiple Pain Sites: No Home Living/Prior Functioning Home Living Family/patient expects to be discharged to:: Private residence Living Arrangements: Other relatives Available Help at Discharge: Available 24 hours/day Type of Home: House Home Access: Stairs to enter Technical brewer of Steps: 2 Entrance Stairs-Rails: Left Home Layout: Multi-level, Two level Alternate Level Stairs-Number of Steps: 12-15 steps to basement (patient states she does not go to basement) Alternate Level Stairs-Rails: Right Bathroom Shower/Tub: Chiropodist: Standard Bathroom Accessibility: Yes IADL History Homemaking Responsibilities: Yes Meal Prep Responsibility: Primary Laundry Responsibility: Administrator, Civil Service) Cleaning Responsibility: Primary Bill Paying/Finance Responsibility: Primary Shopping Responsibility: Primary Child Care Responsibility: Primary Current License: Yes Mode of Transportation: Car Leisure and Hobbies: watch movies; going out to stores in community Prior Function Comments: household ambulator with RW, uses wheelchair for community distances ADL ADL Eating: Modified independent Where Assessed-Grooming: Sitting at sink Upper Body Bathing: Setup Where Assessed-Upper Body Bathing: Shower Lower Body Bathing: Minimal assistance Where Assessed-Lower Body Bathing: Shower Upper Body Dressing: Setup Where Assessed-Upper Body Dressing: Wheelchair Lower Body Dressing: Minimal assistance Where Assessed-Lower Body Dressing: Wheelchair, Standing at sink, Sitting at sink Toileting: Minimal assistance Where Assessed-Toileting: Glass blower/designer: Psychiatric nurse Method: Education officer, museum: Raised toilet seat Social research officer, government: Environmental education officer Method: Radiographer, therapeutic: Radio broadcast assistant Vision Baseline Vision/History: Wears glasses Wears Glasses: At all times Patient Visual Report: No change from baseline Vision Assessment?: Vision impaired- to be further tested in functional context Perception    mild L inattention Praxis  WFL Cognition Overall Cognitive Status: Impaired/Different from baseline Arousal/Alertness: Awake/alert Year: 2019 Month: November Day of Week: Correct Memory: Impaired Memory Impairment: Retrieval deficit;Prospective memory Immediate Memory Recall: Sock;Blue;Bed Memory Recall: Blue;Bed Memory Recall Blue: With Cue Memory Recall Bed: With Cue Selective Attention: Appears intact Awareness: Appears intact Reasoning: Impaired Safety/Judgment: Impaired Sensation Sensation Light Touch: Appears Intact Stereognosis: Not tested Coordination Gross Motor Movements are Fluid and Coordinated: Yes Fine Motor Movements are Fluid and Coordinated: No(decreased/slower LUE) Motor  Motor Motor: Hemiplegia Motor - Skilled Clinical Observations: mild L hemi Mobility  Transfers Sit to Stand: Contact Guard/Touching assist  Trunk/Postural Assessment  Cervical Assessment Cervical Assessment: Exceptions to WFL(head forward) Thoracic Assessment Thoracic Assessment: Exceptions to WFL(rounded shoudlers) Lumbar Assessment Lumbar Assessment: Exceptions to WFL(post pelvic preference) Postural Control Postural Control: Deficits on evaluation(delayed)  Balance Balance Balance Assessed: Yes Dynamic Sitting Balance Dynamic Sitting - Level of Assistance: 5: Stand by assistance Sitting balance - Comments: bathing feet Dynamic Standing Balance Dynamic Standing - Level of Assistance: 4: Min assist Dynamic Standing - Comments: dressing bathing Extremity/Trunk Assessment RUE Assessment RUE  Assessment: Within Functional Limits LUE Assessment LUE Assessment: Exceptions to [facility] Active Range of Motion (AROM) Comments: generalized weakness; decreased coordination LUE Body System: Neuro Brunstrum levels for arm and hand: Arm;Hand Brunstrum level for arm: Stage V Relative Independence from Synergy Brunstrum level for hand: Stage VI Isolated joint movements     Refer to Care Plan for Long Term Goals  Recommendations for other services: Therapeutic Recreation  Pet therapy, Kitchen group and Outing/community reintegration   Discharge Criteria: Patient will be discharged from OT if patient refuses treatment 3 consecutive times without medical reason, if treatment goals not met, if there is a change in medical status, if patient makes no progress towards goals or if patient is discharged from hospital.  The above assessment, treatment plan, treatment alternatives and goals were discussed and mutually agreed upon: by patient  Tonny Branch 07/10/2018, 7:11 AM

## 2018-07-10 NOTE — Evaluation (Signed)
Physical Therapy Assessment and Plan  Patient Details  Name: [name] MRN: [id number] Date of Birth: 10-10-1940  PT Diagnosis: Abnormality of gait, Difficulty walking, Hemiplegia, and Muscle weakness Rehab Potential: Excellent ELOS: 5-7 days   Today's Date: 07/10/2018 PT Individual Time: 1300-1410 AND 1600-1655 PT Individual Time Calculation (min): 70 min  AND 55 min  Problem List:  Patient Active Problem List   Diagnosis Date Noted  . Tobacco use disorder 07/09/2018  . Small vessel disease ([place]) 07/09/2018  . Benign essential HTN   . Seizures ([place])   . Tachypnea   . Dysphagia, post-stroke   . Dysarthria 05/19/2018  . Syncope and collapse   . Syncope 08/24/2017  . Closed left femoral fracture ([place]) 04/21/2017  . Seizure disorder ([place]) 04/21/2017  . Closed pertrochanteric fracture of femur, left, initial encounter ([place]) 03/02/2017  . Seizure ([place]) 02/28/2017  . Hip fracture ([place]) 02/28/2017  . Chest pain 12/07/2016  . Atypical chest pain 12/07/2016  . Tachycardia   . NSTEMI (non-ST elevated myocardial infarction) ([place]) 11/02/2016  . Multifocal atrial tachycardia ([place]) 09/25/2016  . Elevated troponin 09/24/2016  . Hypokalemia 09/23/2016  . TIA (transient ischemic attack) 05/05/2016  . Intractable nausea and vomiting 10/15/2015  . Acute coronary syndrome ([place]) 06/01/2015  . Bronchitis 06/01/2015  . Thrombocytopenia ([place]) 06/01/2015  . Cerebral infarction ([place]) right occipital due to small vessel disease s/p tPA 03/06/2014  . Lower urinary tract infectious disease   . PSVT (paroxysmal supraventricular tachycardia) ([place]) 12/09/2013  . Esophageal varices ([place]) 03/29/2013  . Hematemesis 03/29/2013  . Hepatic cirrhosis ([place]) 03/29/2013  . Presence of stent in right coronary artery 07/04/2011  . OSA (obstructive sleep apnea) 07/04/2011  . Aortic sclerosis 07/04/2011  . Aortic insufficiency 07/04/2011  . HTN (hypertension) 07/04/2011  . Carotid stenosis, bilateral  07/04/2011  . Chest pain at rest 07/03/2011  . CAD (coronary artery disease) 07/03/2011  . Hypercholesteremia 07/03/2011  . GERD (gastroesophageal reflux disease) 07/03/2011    Past Medical History:  Past Medical History:  Diagnosis Date  . Anemia   . Aortic insufficiency    Moderate  . Back pain   . Carotid stenosis, bilateral   . Cirrhosis ([place])   . Closed left femoral fracture ([place]) 04/21/2017  . Coronary artery disease    Stent x 2 RCA 1995, cardiac catheterization 09/2016 showing only mild atherosclerosis  . DDD (degenerative disc disease), lumbar   . Essential hypertension   . Falls   . GERD (gastroesophageal reflux disease)   . Hiatal hernia   . History of stroke   . Hypercholesteremia   . IBS (irritable bowel syndrome)   . Non-alcoholic fatty liver disease   . OSA (obstructive sleep apnea)    no CPAP  . Pericardial effusion    a. small by echo 2018.  Marland Kitchen PSVT (paroxysmal supraventricular tachycardia) ([place])   . Recurrent UTI   . Seizures ([place])   . Stroke ([place])   . Type 2 diabetes mellitus ([place])    off medication  . Varices, esophageal (HCC)    Past Surgical History:  Past Surgical History:  Procedure Laterality Date  . [place]  . APPENDECTOMY    . BACK SURGERY  1985  . CARDIAC CATHETERIZATION  [phone]   Stent to the proximal RCA after MI   . CARDIAC CATHETERIZATION N/A 09/26/2016   Procedure: Left Heart Cath and Coronary Angiography;  Surgeon: Leonie Man, MD;  Location: [facility];  Service: Cardiovascular;  Laterality: N/A;  . CATARACT EXTRACTION W/PHACO Right 07/04/2014   Procedure: CATARACT EXTRACTION PHACO AND INTRAOCULAR LENS PLACEMENT (IOC);  Surgeon: Elta Guadeloupe T. Gershon Crane, MD;  Location: [facility];  Service: Ophthalmology;  Laterality: Right;  CDE:13.13  . COLONOSCOPY    . DILATION AND CURETTAGE OF UTERUS     x2  . Epi Retinal Membrane Peel Left   . ERCP    . ESOPHAGEAL BANDING N/A 04/01/2013   Procedure: ESOPHAGEAL  BANDING;  Surgeon: Rogene Houston, MD;  Location: [facility];  Service: Endoscopy;  Laterality: N/A;  . ESOPHAGEAL BANDING N/A 05/24/2013   Procedure: ESOPHAGEAL BANDING;  Surgeon: Rogene Houston, MD;  [facility];  Service: Endoscopy;  Laterality: N/A;  . ESOPHAGEAL BANDING N/A 06/21/2014   Procedure: ESOPHAGEAL BANDING;  Surgeon: Rogene Houston, MD;  [facility];  Service: Endoscopy;  Laterality: N/A;  . ESOPHAGOGASTRODUODENOSCOPY N/A 04/01/2013   Procedure: ESOPHAGOGASTRODUODENOSCOPY (EGD);  Surgeon: Rogene Houston, MD;  [facility];  Service: Endoscopy;  Laterality: N/A;  230-rescheduled to 8:30am Ann notified pt  . ESOPHAGOGASTRODUODENOSCOPY N/A 05/24/2013   Procedure: ESOPHAGOGASTRODUODENOSCOPY (EGD);  Surgeon: Rogene Houston, MD;  [facility];  Service: Endoscopy;  Laterality: N/A;  730  . ESOPHAGOGASTRODUODENOSCOPY N/A 06/21/2014   Procedure: ESOPHAGOGASTRODUODENOSCOPY (EGD);  Surgeon: Rogene Houston, MD;  [facility];  Service: Endoscopy;  Laterality: N/A;  930-rescheduled 10/14 @ 1200 Ann to notify pt  . EYE SURGERY  08   cataract surgery of the left eye  . FEMUR IM NAIL Left 03/02/2017   Procedure: INTRAMEDULLARY (IM) NAIL FEMORAL;  Surgeon: Rod Can, MD;  Location: [facility];  Service: Orthopedics;  Laterality: Left;  . HARDWARE REMOVAL Right 01/17/2013   Procedure: REMOVAL OF HARDWARE AND EXCISION ULNAR STYLOID RIGHT WRIST;  Surgeon: Tennis Must, MD;  Location: [facility];  Service: Orthopedics;  Laterality: Right;  . MYRINGOTOMY  2012   both ears  . ORIF FEMUR FRACTURE Left 04/23/2017   Procedure: OPEN REDUCTION INTERNAL FIXATION (ORIF) DISTAL FEMUR FRACTURE (FRACTURE AROUND FEMORAL NAIL);  Surgeon: Altamese Larkspur, MD;  Location: [facility];  Service: Orthopedics;  Laterality: Left;  . TONSILLECTOMY    . VAGINAL HYSTERECTOMY  1972  . WRIST SURGERY     rt wrist hardwear removal    Assessment &  Plan Clinical Impression: Patient is a 77 year old right-handed female history of aortic insufficiency, PSVT, CAD with stenting maintained on aspirin and Plavix, hypertension, type 2 diabetes mellitus and seizure disorder maintained on Trileptal . Presented 07/06/2018 with acute onset of left facial droop dysarthria and left-sided weakness. Cranial CT scan reviewed, unremarkable for acute intracranial process. Per report, remote lacunar infarcts of the basal ganglia and cerebellum bilaterally. Patient did receive TPA. CT angiogram of head and neck showed no emergent large vessel occlusion. 60% stenosis of left internal carotid artery. MRI showed punctate new white matter infarction in the anterior right occipital lobe. No hemorrhage or mass-effect. Echocardiogram with ejection fraction of 38% grade 2 diastolic dysfunction. EEG negative for seizure. Presently remains on aspirin and Plavix for CVA prophylaxis. Diet has been advanced to a regular consistency. Patient transferred to CIR on 07/09/2018 .   Patient currently requires min with mobility secondary to muscle weakness, decreased cardiorespiratoy endurance, unbalanced muscle activation and decreased coordination, decreased problem solving and decreased memory and decreased standing balance, hemiplegia and decreased balance strategies.  Prior to hospitalization, patient was modified independent  with mobility and  lived with Family(lives with 71 year old granddaughter and son lives across the street, he is unemployed and used to checking in on pt multiple times per day) in a House home.  Home access is 2Stairs to enter.  Patient will benefit from skilled PT intervention to maximize safe functional mobility, minimize fall risk and decrease caregiver burden for planned discharge home with intermittent assist.  Anticipate patient will benefit from follow up [facility] at discharge.  PT - End of Session Activity Tolerance: Tolerates 10 - 20 min activity  with multiple rests Endurance Deficit: Yes Endurance Deficit Description: decreased PT Assessment Rehab Potential (ACUTE/IP ONLY): Excellent PT Barriers to Discharge: Behavior PT Barriers to Discharge Comments: mild memory impairments PT Patient demonstrates impairments in the following area(s): Balance;Endurance;Motor;Safety PT Transfers Functional Problem(s): Bed Mobility;Bed to Chair;Car;Furniture;Floor PT Locomotion Functional Problem(s): Stairs;Wheelchair Mobility PT Plan PT Intensity: Minimum of 1-2 x/day ,45 to 90 minutes PT Frequency: 5 out of 7 days PT Duration Estimated Length of Stay: 5-7 days PT Treatment/Interventions: Ambulation/gait training;Disease management/prevention;Pain management;Visual/perceptual remediation/compensation;Stair training;Therapeutic Activities;Wheelchair propulsion/positioning;Patient/family education;DME/adaptive equipment instruction;Balance/vestibular training;Cognitive remediation/compensation;Functional electrical stimulation;Psychosocial support;Therapeutic Exercise;UE/LE Strength taining/ROM;Skin care/wound management;Functional mobility training;Community reintegration;Discharge planning;Neuromuscular re-education;Splinting/orthotics;UE/LE Coordination activities PT Transfers Anticipated Outcome(s): modified independent PT Locomotion Anticipated Outcome(s): modified independent w/ RW and/or w/c PT Recommendation Follow Up Recommendations: Home health PT Patient destination: Home Equipment Recommended: To be determined Equipment Details: has w/c and RW already  Skilled Therapeutic Intervention  Session 1:  Pt in supine and agreeable to therapy, denies pain. Performed functional mobility as detailed below including car transfer w/ min assist. Pt self-propelled w/c around unit w/ supervision using BUEs in multiple 100-150' bouts to work on functional strength as this is her primary means of locomotion.Daughter-in-law and grandchildren present at  end of session and confirmed pt's PLOF information and baseline mobility status. Instructed pt and family members in results of PT evaluation as detailed below, PT POC, rehab potential, rehab goals, and discharge recommendations. Additionally discussed CIR's policies regarding fall safety and use of chair alarm and/or quick release belt. Pt verbalized understanding and in agreement. Ended session in supine, all needs in reach.  Session 2:  Pt received in care of NT while toileting, denies pain. Supervision transfer back to w/c, pt managed LE garments w/o assist. Supervision for w/c mobility around room while pt washed hands. Pt self-propelled w/c to/from therapy gym and around unit w/ supervision using BUEs to work on endurance training. Performed NuStep 10 min @ level 3 for LE strengthening. Educated pt about causes of stroke, stroke recovery, and stroke impairments, per her request. Discussed her risk of having another stroke having already had one. Pt seems unaware of her medical impairments including hypertension that may have increased her risk of having a stroke, states "I've always had low blood pressure". Will continue to educate. Practiced w/c to/from couch in ADL apartment x2 reps w/ verbal and visual cues for safety and set-up of transfer. Returned to room via w/c and ended session in w/c, all needs in reach.    PT Evaluation Precautions/Restrictions Precautions Precautions: Fall Restrictions Weight Bearing Restrictions: No Pain Pain Assessment Pain Scale: 0-10 Pain Score: 0-No pain Home Living/Prior Functioning Home Living Available Help at Discharge: Available 24 hours/day Type of Home: House Home Access: Stairs to enter [employer] of Steps: 2 Entrance Stairs-Rails: Left Home Layout: Two level;Laundry or work area in basement;Able to live on main level with bedroom/bathroom Alternate Level Stairs-Number of Steps: does not go to basement ever Bathroom Shower/Tub:  Tub/shower  unit Bathroom Toilet: Standard Bathroom Accessibility: Yes  Lives With: Family(lives with 49 year old granddaughter and son lives across the street, he is unemployed and used to checking in on pt multiple times per day) Prior Function Level of Independence: Independent with transfers;Independent with basic ADLs;Independent with gait;Requires assistive device for independence;Independent with homemaking with wheelchair  Able to Take Stairs?: Yes Driving: No Vocation: Retired Comments: limited household ambulator with RW, uses wheelchair for farther household distances and community distances, granddaughter does Office manager, son cooks meals and does grocery shopping Vision/Perception  Perception Perception: Within Financial controller Praxis: Intact  Cognition Overall Cognitive Status: History of cognitive impairments - at baseline Arousal/Alertness: Awake/alert Orientation Level: Oriented X4 Selective Attention: Appears intact Memory: Impaired Memory Impairment: Decreased short term memory(pt reports this is her baseline) Awareness: Appears intact Problem Solving: Impaired Reasoning: Impaired Safety/Judgment: Appears intact Sensation Sensation Light Touch: Appears Intact Coordination Gross Motor Movements are Fluid and Coordinated: Yes Heel Shin Test: Impaired LLE, suspect 2/2 weakness Motor  Motor Motor: Hemiplegia Motor - Skilled Clinical Observations: mild L hemi  Mobility Bed Mobility Bed Mobility: Rolling Right;Rolling Left;Supine to Sit;Sit to Supine Rolling Right: Supervision/verbal cueing Rolling Left: Supervision/Verbal cueing Supine to Sit: Supervision/Verbal cueing Sit to Supine: Supervision/Verbal cueing Transfers Transfers: Stand Pivot Transfers;Sit to Stand;Stand to Sit Sit to Stand: Contact Guard/Touching assist Stand to Sit: Contact Guard/Touching assist Stand Pivot Transfers: Contact Guard/Touching assist Stand Pivot Transfer  Details: Verbal cues for safe use of DME/AE;Verbal cues for precautions/safety Transfer (Assistive device): None Locomotion  Gait Ambulation: Yes Gait Assistance: Contact Guard/Touching assist Gait Distance (Feet): 50 Feet Assistive device: Rolling walker Gait Gait: Yes Gait Pattern: Impaired Gait Pattern: Decreased dorsiflexion - left;Step-to pattern(pt reports this is her baseline) Gait velocity: very decreased Stairs / Additional Locomotion Stairs: Yes Stairs Assistance: Minimal Assistance - Patient > 75% Stair Management Technique: Two rails(pt inconsistent in telling therapist which side has a rail at home when entering the house) Number of Stairs: 4 Height of Stairs: 6 Wheelchair Mobility Wheelchair Mobility: Yes Wheelchair Assistance: Chartered loss adjuster: Both upper extremities Wheelchair Parts Management: Needs assistance Distance: 150'  Trunk/Postural Assessment  Cervical Assessment Cervical Assessment: Exceptions to WFL(forward head) Thoracic Assessment Thoracic Assessment: Exceptions to WFL(rounded shoulders) Lumbar Assessment Lumbar Assessment: Exceptions to WFL(posterior pelvic tilt) Postural Control Postural Control: Within Functional Limits  Balance Balance Balance Assessed: Yes Dynamic Sitting Balance Dynamic Sitting - Level of Assistance: 5: Stand by assistance Dynamic Standing Balance Dynamic Standing - Level of Assistance: 4: Min assist Extremity Assessment  RUE Assessment RUE Assessment: Within Functional Limits LUE Assessment LUE Assessment: Exceptions to [facility] Active Range of Motion (AROM) Comments: generalized weakness; decreased coordination LUE Body System: Neuro Brunstrum levels for arm and hand: Arm;Hand Brunstrum level for arm: Stage V Relative Independence from Synergy Brunstrum level for hand: Stage VI Isolated joint movements RLE Assessment RLE Assessment: Within Functional Limits LLE Assessment LLE  Assessment: Exceptions to [facility] Passive Range of Motion (PROM) Comments: ankle limited to neutral  General Strength Comments: globally 4/5     Refer to Care Plan for Long Term Goals  Recommendations for other services: None   Discharge Criteria: Patient will be discharged from PT if patient refuses treatment 3 consecutive times without medical reason, if treatment goals not met, if there is a change in medical status, if patient makes no progress towards goals or if patient is discharged from hospital.  The above assessment, treatment plan, treatment alternatives and goals were discussed and mutually  agreed upon: by patient and by family  Bobbie Stack 07/10/2018, 2:14 PM

## 2018-07-11 LAB — URINALYSIS, ROUTINE W REFLEX MICROSCOPIC
Bacteria, UA: NONE SEEN
Bilirubin Urine: NEGATIVE
Glucose, UA: NEGATIVE mg/dL
Ketones, ur: NEGATIVE mg/dL
Leukocytes, UA: NEGATIVE
NITRITE: NEGATIVE
PH: 6 (ref 5.0–8.0)
Protein, ur: NEGATIVE mg/dL
SPECIFIC GRAVITY, URINE: 1.011 (ref 1.005–1.030)

## 2018-07-11 MED ORDER — AMOXICILLIN 250 MG PO CAPS: 250.0000 mg | ORAL_CAPSULE | Freq: Three times a day (TID) | ORAL | Status: DC

## 2018-07-11 NOTE — Progress Notes (Signed)
Patient ID: [name], female   DOB: Dec 14, 1940, 77 y.o.   MRN: [id number]   [name] is a 77 y.o. female who is admitted for CIR with left-sided weakness and dysarthria following a right occipital lobe infarction  Past Medical History:  Diagnosis Date  . Anemia   . Aortic insufficiency    Moderate  . Back pain   . Carotid stenosis, bilateral   . Cirrhosis ([place])   . Closed left femoral fracture ([place]) 04/21/2017  . Coronary artery disease    Stent x 2 RCA 1995, cardiac catheterization 09/2016 showing only mild atherosclerosis  . DDD (degenerative disc disease), lumbar   . Essential hypertension   . Falls   . GERD (gastroesophageal reflux disease)   . Hiatal hernia   . History of stroke   . Hypercholesteremia   . IBS (irritable bowel syndrome)   . Non-alcoholic fatty liver disease   . OSA (obstructive sleep apnea)    no CPAP  . Pericardial effusion    a. small by echo 2018.  Marland Kitchen PSVT (paroxysmal supraventricular tachycardia) ([place])   . Recurrent UTI   . Seizures ([place])   . Stroke ([place])   . Type 2 diabetes mellitus ([place])    off medication  . Varices, esophageal (HCC)      Subjective: Onset of burning dysuria and frequency late yesterday afternoon.  She states that she was completing antibiotic therapy with Cipro prior to hospital admission.  Patient has a low-grade fever.  She states that she does have a history of chronic UTIs.  No antibiotic drug sensitivities.  Otherwise doing well without concerns or complaints  Objective: Vital signs in last 24 hours: Temp:  [98.1 F (36.7 C)-99.2 F (37.3 C)] 99.2 F (37.3 C) (11/03 0618) Pulse Rate:  [90-101] 95 (11/03 0618) Resp:  [16-18] 18 (11/03 0618) BP: (156-187)/(53-61) 156/53 (11/03 0618) SpO2:  [96 %-100 %] 96 % (11/03 0618) Weight change:  Last BM Date: 07/09/18  Intake/Output from previous day: 11/02 0701 - 11/03 0700 In: 840 [P.O.:840] Out: 1 [Urine:1]   Physical Exam General: No apparent distress    HEENT: not dry Lungs: Normal effort. Lungs clear to auscultation, no crackles or wheezes. Cardiovascular: Regular rate and rhythm, no edema Abdomen: S/NT/ND; BS(+) Musculoskeletal:  unchanged Neurological: No new neurological deficits with mild left-sided weakness Wounds: N/A    Skin: clear   Mental state: Alert, oriented, cooperative    Lab Results: BMET    Component Value Date/Time   NA 140 07/08/2018 0438   K 3.5 07/08/2018 0438   CL 108 07/08/2018 0438   CO2 25 07/08/2018 0438   GLUCOSE 115 (H) 07/08/2018 0438   BUN <5 (L) 07/08/2018 0438   CREATININE 0.70 07/08/2018 0438   CREATININE 0.78 01/12/2018 1310   CALCIUM 8.5 (L) 07/08/2018 0438   CALCIUM 8.4 (L) 04/22/2017 0514   GFRNONAA >60 07/08/2018 0438   GFRNONAA 74 01/12/2018 1310   GFRAA >60 07/08/2018 0438   GFRAA 86 01/12/2018 1310   CBC    Component Value Date/Time   WBC 9.2 07/08/2018 0438   RBC 3.86 (L) 07/08/2018 0438   HGB 12.0 07/08/2018 0438   HCT 36.2 07/08/2018 0438   PLT 132 (L) 07/08/2018 0438   MCV 93.8 07/08/2018 0438   MCH 31.1 07/08/2018 0438   MCHC 33.1 07/08/2018 0438   RDW 13.8 07/08/2018 0438   LYMPHSABS 2.2 07/06/2018 1134   MONOABS 0.5 07/06/2018 1134   EOSABS 0.2 07/06/2018 1134   BASOSABS  0.0 07/06/2018 1134    Medications: I have reviewed the patient's current medications.  Assessment/Plan:  Functional deficits secondary to right occipital infarction secondary to small vessel disease.  Continue seeing IR DVT prophylaxis.  Continue SCDs Type 2 diabetes Recurrent UTI.  Will check UA and urine culture with sensitivities.  Placed on empiric antibiotics pending results    Length of stay, days: 2  Marletta Lor , MD 07/11/2018, 10:02 AM

## 2018-07-11 NOTE — Plan of Care (Signed)
  Problem: Consults Goal: RH STROKE PATIENT EDUCATION Description See Patient Education module for education specifics  Outcome: Progressing   Problem: RH BOWEL ELIMINATION Goal: RH STG MANAGE BOWEL WITH ASSISTANCE Description STG Manage Bowel with min.Assistance.  Outcome: Progressing   Problem: RH BLADDER ELIMINATION Goal: RH STG MANAGE BLADDER WITH ASSISTANCE Description STG Manage Bladder With min.Assistance  Outcome: Progressing   Problem: RH SKIN INTEGRITY Goal: RH STG SKIN FREE OF INFECTION/BREAKDOWN Description With min. assist  Outcome: Progressing Goal: RH STG MAINTAIN SKIN INTEGRITY WITH ASSISTANCE Description STG Maintain Skin Integrity With min. Assistance.  Outcome: Progressing   Problem: RH SAFETY Goal: RH STG ADHERE TO SAFETY PRECAUTIONS W/ASSISTANCE/DEVICE Description STG Adhere to Safety Precautions With min. Assistance/Device.  Outcome: Progressing   Problem: RH PAIN MANAGEMENT Goal: RH STG PAIN MANAGED AT OR BELOW PT'S PAIN GOAL Description Less than 3  Outcome: Progressing   Problem: RH KNOWLEDGE DEFICIT Goal: RH STG INCREASE KNOWLEDGE OF STROKE PROPHYLAXIS Description With min. Assist.  Outcome: Progressing

## 2018-07-11 NOTE — Progress Notes (Signed)
At 2203, prn tylenol given for C/O left hip pain and PRN zofran given PO for C/O feeling nauseated. Patrici Ranks A

## 2018-07-11 NOTE — Progress Notes (Signed)
Patient has not had a BM since 10/31.  She states she has been trying to have one all day but has been unsuccessful.  Patient has senna ordered prn qhs and sorbitol.  Gave patient a double dose of warm prune juice and instructed her to take the senna tonight as well.  If no BM by morning, will try sorbitol.  Patient was in agreement with plan.  Will continue to monitor.  Brita Romp, RN

## 2018-07-11 NOTE — Progress Notes (Signed)
Patient complains of urinary frequency and burning with urination. "I'm going to get my son to bring me my cipro." I spoke with patient R/T only taking meds that are prescribed. PRN tylenol given for C/O left hip pain. Continent of B& B. Patrici Ranks A

## 2018-07-12 ENCOUNTER — Inpatient Hospital Stay (HOSPITAL_COMMUNITY): Payer: Self-pay | Admitting: Occupational Therapy

## 2018-07-12 ENCOUNTER — Inpatient Hospital Stay (HOSPITAL_COMMUNITY): Payer: Self-pay | Admitting: Physical Therapy

## 2018-07-12 DIAGNOSIS — G8114 Spastic hemiplegia affecting left nondominant side: Secondary | ICD-10-CM

## 2018-07-12 DIAGNOSIS — Z8673 Personal history of transient ischemic attack (TIA), and cerebral infarction without residual deficits: Secondary | ICD-10-CM

## 2018-07-12 LAB — CBC WITH DIFFERENTIAL/PLATELET
ABS IMMATURE GRANULOCYTES: 0.01 10*3/uL (ref 0.00–0.07)
BASOS ABS: 0 10*3/uL (ref 0.0–0.1)
Basophils Relative: 0 %
Eosinophils Absolute: 0.3 10*3/uL (ref 0.0–0.5)
Eosinophils Relative: 6 %
HCT: 37.4 % (ref 36.0–46.0)
Hemoglobin: 12.4 g/dL (ref 12.0–15.0)
IMMATURE GRANULOCYTES: 0 %
Lymphocytes Relative: 24 %
Lymphs Abs: 1.1 10*3/uL (ref 0.7–4.0)
MCH: 31.1 pg (ref 26.0–34.0)
MCHC: 33.2 g/dL (ref 30.0–36.0)
MCV: 93.7 fL (ref 80.0–100.0)
Monocytes Absolute: 0.6 10*3/uL (ref 0.1–1.0)
Monocytes Relative: 12 %
NEUTROS ABS: 2.7 10*3/uL (ref 1.7–7.7)
NEUTROS PCT: 58 %
NRBC: 0 % (ref 0.0–0.2)
PLATELETS: 123 10*3/uL — AB (ref 150–400)
RBC: 3.99 MIL/uL (ref 3.87–5.11)
RDW: 13.5 % (ref 11.5–15.5)
WBC: 4.7 10*3/uL (ref 4.0–10.5)

## 2018-07-12 LAB — COMPREHENSIVE METABOLIC PANEL
ALBUMIN: 3.2 g/dL — AB (ref 3.5–5.0)
ALT: 43 U/L (ref 0–44)
AST: 62 U/L — AB (ref 15–41)
Alkaline Phosphatase: 100 U/L (ref 38–126)
Anion gap: 3 — ABNORMAL LOW (ref 5–15)
BUN: 7 mg/dL — ABNORMAL LOW (ref 8–23)
CHLORIDE: 110 mmol/L (ref 98–111)
CO2: 27 mmol/L (ref 22–32)
Calcium: 8.8 mg/dL — ABNORMAL LOW (ref 8.9–10.3)
Creatinine, Ser: 0.68 mg/dL (ref 0.44–1.00)
GFR calc non Af Amer: >60 mL/min (ref >60)
GLUCOSE: 102 mg/dL — AB (ref 70–99)
Potassium: 3.6 mmol/L (ref 3.5–5.1)
SODIUM: 140 mmol/L (ref 135–145)
Total Bilirubin: 0.9 mg/dL (ref 0.3–1.2)
Total Protein: 5.8 g/dL — ABNORMAL LOW (ref 6.5–8.1)

## 2018-07-12 LAB — URINE CULTURE: Culture: NO GROWTH

## 2018-07-12 MED ORDER — ALUM & MAG HYDROXIDE-SIMETH 200-200-20 MG/5ML PO SUSP
30.0000 mL | Freq: Four times a day (QID) | ORAL | Status: DC | PRN
  Administered 2018-07-12: 30 mL via ORAL
  Filled 2018-07-12: qty 30

## 2018-07-12 MED ORDER — PRO-STAT SUGAR FREE PO LIQD
30.0000 mL | Freq: Two times a day (BID) | ORAL | Status: DC
  Administered 2018-07-12 – 2018-07-14 (×4): 30 mL via ORAL
  Filled 2018-07-12 (×5): qty 30

## 2018-07-12 MED ORDER — CALCIUM CITRATE 950 (200 CA) MG PO TABS
1.0000 | ORAL_TABLET | Freq: Every day | ORAL | Status: DC
  Administered 2018-07-12 – 2018-07-15 (×4): 200 mg via ORAL
  Filled 2018-07-12 (×4): qty 1

## 2018-07-12 NOTE — Progress Notes (Signed)
Physical Therapy Session Note  Patient Details  Name: [name] MRN: [id number] Date of Birth: [birth year]  Today's Date: 07/12/2018 PT Individual Time: 1000-1100 PT Individual Time Calculation (min): 60 min   Short Term Goals: Week 1:  PT Short Term Goal 1 (Week 1): =LTGs due to ELOS  Skilled Therapeutic Interventions/Progress Updates:     Patient received sitting up in [facility], very pleasant and willing to participate in PT session today. Able to ambulate into bathroom using RW with S, toileted and performed pericare with S as well. Then able to progress gait training to approximately 113f with RW, very slow gait pace and with cues to improve step length and pace today but gait speed continues to be limited. Able to tolerate riding Nustep on level 2 with B UEs/LEs for 10 minutes with cues for speed to promote higher intensity exercise level. Also performed activities in front of Dynavision in tandem stance on solid surface and in normal BOS on blue foam pad with cross midline reaching and use of B UEs during activity to challenge balance and promote weight shifting for gait. [facility], transferred back into bed with min guard and no device. She was left in bed with all needs met, bed alarm active this morning.   Therapy Documentation Precautions:  Precautions Precautions: Fall Restrictions Weight Bearing Restrictions: No General:   Vital Signs:  Pain: Pain Assessment Pain Scale: 0-10 Pain Score: 0-No pain Faces Pain Scale: No hurt    Therapy/Group: Individual Therapy  KDeniece ReePT, DPT, CBIS  Supplemental Physical Therapist [facility]   Pager [phone] Rehab Office [phone]  07/12/2018, 11:20 AM

## 2018-07-12 NOTE — Progress Notes (Signed)
No BM, PRN senna s given at HS.Marland KitchenPRN tylenol managing left hip pain.[name] A

## 2018-07-12 NOTE — Progress Notes (Signed)
[facility] PHYSICAL MEDICINE & REHABILITATION PROGRESS NOTE   Subjective/Complaints:  No issues overnite except constipation , nourinary c/o Good appetite Has been wearing AFO for one year but cannot recall which MD prescribed or what casued her problems Multiple falls with femur and pubic ramus fx  ROS- neg CP SOB , N/V/D   Objective:   No results found. Recent Labs    07/12/18 0543  WBC 4.7  HGB 12.4  HCT 37.4  PLT 123*   Recent Labs    07/12/18 0543  NA 140  K 3.6  CL 110  CO2 27  GLUCOSE 102*  BUN 7*  CREATININE 0.68  CALCIUM 8.8*    Intake/Output Summary (Last 24 hours) at 07/12/2018 0744 Last data filed at 07/11/2018 2300 Gross per 24 hour  Intake 1060 ml  Output 600 ml  Net 460 ml     Physical Exam: Vital Signs Blood pressure (!) 165/62, pulse 96, temperature 98.8 F (37.1 C), resp. rate 16, height 5' 3"  (1.6 m), weight 58.2 kg, SpO2 97 %.  General: No acute distress Mood and affect are appropriate Heart: Regular rate and rhythm no rubs murmurs or extra sounds Lungs: Clear to auscultation, breathing unlabored, no rales or wheezes Abdomen: Positive bowel sounds, soft nontender to palpation, nondistended Extremities: No clubbing, cyanosis, or edema Skin: No evidence of breakdown, no evidence of rash Neurologic: Cranial nerves II through XII intact, motor strength is 5/5 in bilateral deltoid, bicep, tricep, grip, hip flexor, knee extensors, R ankle dorsiflexor and plantar flexor, 0/5 Left ankle DF with equinus positioning Sensory exam normal sensation to light touch and proprioception in bilateral upper and lower extremities Cerebellar exam normal finger to nose to finger Musculoskeletal: Full range of motion in all 4 extremities. No joint swelling    Assessment/Plan: 1. Functional deficits secondary to Right occipital lobe infarct which require 3+ hours per day of interdisciplinary therapy in a comprehensive inpatient rehab setting.  Physiatrist  is providing close team supervision and 24 hour management of active medical problems listed below.  Physiatrist and rehab team continue to assess barriers to discharge/monitor patient progress toward functional and medical goals  Care Tool:  Bathing    Body parts bathed by patient: Right arm, Left arm, Chest, Abdomen, Front perineal area, Buttocks, Right upper leg, Left upper leg, Right lower leg, Left lower leg, Face         Bathing assist Assist Level: Minimal Assistance - Patient > 75%     Upper Body Dressing/Undressing Upper body dressing   What is the patient wearing?: Hospital gown only    Upper body assist Assist Level: Set up assist    Lower Body Dressing/Undressing Lower body dressing      What is the patient wearing?: Hospital gown only     Lower body assist Assist for lower body dressing: Minimal Assistance - Patient > 75%     Toileting Toileting    Toileting assist Assist for toileting: Moderate Assistance - Patient 50 - 74%     Transfers Chair/bed transfer  Transfers assist     Chair/bed transfer assist level: Moderate Assistance - Patient 50 - 74%     Locomotion Ambulation   Ambulation assist      Assist level: Contact Guard/Touching assist Assistive device: Walker-rolling Max distance: 50'   Walk 10 feet activity   Assist     Assist level: Contact Guard/Touching assist Assistive device: Walker-rolling   Walk 50 feet activity   Assist    Assist  level: Contact Guard/Touching assist Assistive device: Walker-rolling    Walk 150 feet activity   Assist Walk 150 feet activity did not occur: Safety/medical concerns         Walk 10 feet on uneven surface  activity   Assist Walk 10 feet on uneven surfaces activity did not occur: Safety/medical concerns         Wheelchair     Assist Will patient use wheelchair at discharge?: Yes      Wheelchair assist level: Supervision/Verbal cueing Max wheelchair  distance: 150'    Wheelchair 50 feet with 2 turns activity    Assist        Assist Level: Supervision/Verbal cueing   Wheelchair 150 feet activity     Assist     Assist Level: Supervision/Verbal cueing    Medical Problem List and Plan: 1.  Left-sided weakness and dysarthria secondary to anterior right occipital lobe infarction secondary to small vessel disease status post TPA- on plavix             -CIR PT, OT, SLP 2.  DVT Prophylaxis/Anticoagulation: SCDs.  Monitor for any signs of DVT, 3. Pain Management: Tylenol as needed 4. Mood: Provide emotional support.  Anxiety lorazepam 1 mg daily as needed 5. Neuropsych: This patient is capable of making decisions on her own behalf. 6. Skin/Wound Care: Routine skin checks 7. Fluids/Electrolytes/Nutrition: Routine in and outs with follow-up chemistries ordered, CMET ok except alb a little low, prostat 8.  Seizure disorder.  Continue Trileptal 300 mg twice daily 9.  CAD with stenting/PSVT.  No chest pain.  Continue aspirin and Plavix 10.  Type 2 diabetes mellitus.  Hemoglobin A1c 4.6.  Blood sugar checks discontinued- diet controlled 11.  Hyperlipidemia.  Lipitor  12.  CBC normal except mild thrombocytopenia 13.  Multiple falls with pelvic and L femur fracture start Calcium citrate +Vit D 14.  Left spastic foot drop- Right MCA infarct 2015 with AFO- may benefit from botox, PRAFO LOS: 3 days A FACE TO FACE EVALUATION WAS PERFORMED  Charlett Blake 07/12/2018, 7:44 AM

## 2018-07-12 NOTE — Progress Notes (Signed)
Occupational Therapy Session Note  Patient Details  Name: [name] MRN: [id number] Date of Birth: 04-05-1941  Today's Date: 07/12/2018 OT Individual Time: 0830-0900 OT Individual Time Calculation (min): 30 min    Short Term Goals: Week 1:  OT Short Term Goal 1 (Week 1): n/a d/t ELOS  Skilled Therapeutic Interventions/Progress Updates:    Patient using the bathroom with nursing upon arrival.  Completed toileting and dressing to start the therapy session today.  Toileting/hygiene - min A for thoroughness to complete hygiene, CG for clothing mgmt, UB dressing set up, LB dressing CG for pants over hips with one LOB posteriorly and min assistance required to regain.  Socks/shoes/brace set up.   Functional transfers: SPT with RW to/from toilet, w/c with CG.   Ambulation completed with RW CG and min cues for rate.  She notes that she is walking more slowly than prior to admission but able to walk to nurses station and back to room, denies fatigue.  Patient is pleasant and pleased with her progress.  She states that she is ready to go home.  Denies pain.  Returned to room seated in w/c with alarm set.    Therapy Documentation Precautions:  Precautions Precautions: Fall Restrictions Weight Bearing Restrictions: No General:   Vital Signs:  Pain: Pain Assessment Pain Scale: 0-10 Pain Score: 0-No pain Faces Pain Scale: No hurt  Therapy/Group: Individual Therapy  Carlos Levering 07/12/2018, 12:23 PM

## 2018-07-12 NOTE — IPOC Note (Addendum)
Overall Plan of Care [facility]) Patient Details Name: [name] MRN: [id number] DOB: 08/28/1941  Admitting Diagnosis: <principal problem not specified>  Hospital Problems: Active Problems:   Small vessel disease ([place])     Functional Problem List: Nursing Endurance, Skin Integrity, Safety, Pain  PT Balance, Endurance, Motor, Safety  OT Balance, Cognition, Endurance, Motor, Perception, Safety, Vision  SLP    TR         Basic ADL's: OT Grooming, Bathing, Dressing, Toileting, Eating     Advanced  ADL's: OT Simple Meal Preparation     Transfers: PT Bed Mobility, Bed to Chair, Car, [employer], Futures trader, Tub/Shower     Locomotion: PT Stairs, Emergency planning/management officer     Additional Impairments: OT Fuctional Use of Upper Extremity  SLP        TR      Anticipated Outcomes Item Anticipated Outcome  Self Feeding MOD I  Swallowing      Basic self-care  MOD I  Toileting  MOD I   Bathroom Transfers MOD I  Bowel/Bladder  Continent to bowel and bladder with min. assisst.  Transfers  modified independent  Locomotion  modified independent w/ RW and/or w/c  Communication     Cognition     Pain  Less than 3,on 1 to 10 scale.  Safety/Judgment  Free from falls during her stay in rehab.   Therapy Plan: PT Intensity: Minimum of 1-2 x/day ,45 to 90 minutes PT Frequency: 5 out of 7 days PT Duration Estimated Length of Stay: 5-7 days OT Intensity: Minimum of 1-2 x/day, 45 to 90 minutes OT Frequency: 5 out of 7 days OT Duration/Estimated Length of Stay: 7-10      Team Interventions: Nursing Interventions Patient/Family Education, Disease Management/Prevention, Skin Care/Wound Management, Discharge Planning  PT interventions Ambulation/gait training, Disease management/prevention, Pain management, Visual/perceptual remediation/compensation, Stair training, Therapeutic Activities, Wheelchair propulsion/positioning, Patient/family education, Dispensing optician, Training and development officer, Cognitive remediation/compensation, Functional electrical stimulation, Psychosocial support, Therapeutic Exercise, UE/LE Strength taining/ROM, Skin care/wound management, Functional mobility training, Community reintegration, Discharge planning, Neuromuscular re-education, Splinting/orthotics, UE/LE Coordination activities  OT Interventions Discharge planning, Training and development officer, Cognitive remediation/compensation, Community reintegration, Disease mangement/prevention, Engineer, drilling, Functional mobility training, Neuromuscular re-education, Pain management, Patient/family education, Psychosocial support, Self Care/advanced ADL retraining, Skin care/wound managment, Therapeutic Activities, Therapeutic Exercise, UE/LE Strength taining/ROM, UE/LE Coordination activities, Visual/perceptual remediation/compensation  SLP Interventions    TR Interventions    SW/CM Interventions Discharge Planning, Psychosocial Support, Patient/Family Education   Barriers to Discharge MD  Medical stability  Nursing      PT Behavior mild memory impairments  OT      SLP      SW       Team Discharge Planning: Destination: PT-Home ,OT- Home , SLP-  Projected Follow-up: PT-Home health PT, OT-  Home health OT, SLP-  Projected Equipment Needs: PT-To be determined, OT- To be determined, SLP-  Equipment Details: PT-has w/c and RW already, OT-likely none Patient/family involved in discharge planning: PT- Family member/caregiver, Patient,  OT-Patient, SLP-   MD ELOS: 4-7d Medical Rehab Prognosis:  Good Assessment: 77 year old right-handed female history of aortic insufficiency, PSVT, CAD with stenting maintained on aspirin and Plavix, hypertension, type 2 diabetes mellitus and seizure disorder maintained on Trileptal .  Per chart review and patient, patient lives alone.  2 level home with 2 steps to entry.  Independent with assistive device prior to  admission.  She does have a wheelchair for longer distances.  Son lives next door.  Patient states that she plans to go home with her son who can provide assistance.  Presented 07/06/2018 with acute onset of left facial droop dysarthria and left-sided weakness.  Cranial CT scan reviewed, unremarkable for acute intracranial process.  Per report, remote lacunar infarcts of the basal ganglia and cerebellum bilaterally.  Patient did receive TPA.  CT angiogram of head and neck showed no emergent large vessel occlusion.  60% stenosis of left internal carotid artery.  MRI showed punctate new white matter infarction in the anterior right occipital lobe.  No hemorrhage or mass-effect.  Echocardiogram with ejection fraction of 17% grade 2 diastolic dysfunction.  EEG negative for seizure.  Presently remains on aspirin and Plavix for CVA prophylaxis.  Diet has been advanced to a regular consistency    Now requiring 24/7 Rehab RN,MD, as well as CIR level PT, OT and SLP.  Treatment team will focus on ADLs and mobility with goals set at Mod I  See Team Conference Notes for weekly updates to the plan of care

## 2018-07-12 NOTE — Progress Notes (Signed)
Occupational Therapy Note  Patient Details  Name: Renita Brocks MRN: [id number] Date of Birth: 10-13-1940  Today's Date: 07/12/2018 OT Missed Time: 29 Minutes Missed Time Reason: Patient fatigue  Patient resting in bed with family present.  Offered therapy/Attempted to see patient at 1600 for OT but patient declined due to fatigue.    Bonnita Levan Amir Glaus 07/12/2018, 4:05 PM

## 2018-07-12 NOTE — Progress Notes (Signed)
Physical Therapy Session Note  Patient Details  Name: [name] MRN: [id number] Date of Birth: 1941/03/28  Today's Date: 07/12/2018 PT Individual Time: 1418-1530 PT Individual Time Calculation (min): 72 min   Short Term Goals: Week 1:  PT Short Term Goal 1 (Week 1): =LTGs due to ELOS  Skilled Therapeutic Interventions/Progress Updates:    Patient received sitting at EOB with nursing staff present, pleasant and willing to participate in PT today. Able to ambulate approximately 131f with RW and close S, cues to improve stance time L LE/step length R LE during gait today. Able to perform all functional transfers during session with S-min guard with and without assistive device. She continues to report ongoing discomfort in L quadriceps musculature which was very tender to touch and with multiple sore trigger points noted, applied heat to this muscle group while educating on the differences between muscle spasm and muscle tone, followed heat with IASTM and quad and hip flexor stretching to L LE as tolerated with patient reporting improvement in symptoms following these interventions. Otherwise worked on functional strengthening for hip and BLE musculature, reviewed technique for functional bed mobility, and worked on balance strategies without UE support and with feet in narrow BOS and semi-tandem stance today. She tolerated ambulating an additional 537fbefore being limited by nausea (nursing staff aware this has been ongoing throughout the day). [facility] left in bed with all needs met, bed alarm activated.   Therapy Documentation Precautions:  Precautions Precautions: Fall Restrictions Weight Bearing Restrictions: No General:   Pain: Pain Assessment Pain Scale: 0-10 Pain Score: 0-No pain Faces Pain Scale: No hurt    Therapy/Group: Individual Therapy  KrDeniece ReeT, DPT, CBIS  Supplemental Physical Therapist [facility]  Pager  [phone] [phone] 07/12/2018, 3:56 PM

## 2018-07-13 ENCOUNTER — Inpatient Hospital Stay (HOSPITAL_COMMUNITY): Payer: Self-pay | Admitting: Occupational Therapy

## 2018-07-13 ENCOUNTER — Inpatient Hospital Stay (HOSPITAL_COMMUNITY): Payer: Self-pay | Admitting: Physical Therapy

## 2018-07-13 ENCOUNTER — Inpatient Hospital Stay (HOSPITAL_COMMUNITY): Payer: Self-pay

## 2018-07-13 ENCOUNTER — Other Ambulatory Visit: Payer: Self-pay

## 2018-07-13 MED ORDER — MUSCLE RUB 10-15 % EX CREA
TOPICAL_CREAM | Freq: Two times a day (BID) | CUTANEOUS | Status: DC | PRN
  Administered 2018-07-13: 10:00:00 via TOPICAL
  Filled 2018-07-13: qty 85

## 2018-07-13 MED ORDER — TROLAMINE SALICYLATE 10 % EX CREA: TOPICAL_CREAM | Freq: Two times a day (BID) | CUTANEOUS | Status: DC | PRN

## 2018-07-13 MED ORDER — METHOCARBAMOL 500 MG PO TABS
500.0000 mg | ORAL_TABLET | Freq: Once | ORAL | Status: AC
  Administered 2018-07-13: 500 mg via ORAL
  Filled 2018-07-13: qty 1

## 2018-07-13 NOTE — Progress Notes (Signed)
[facility] PHYSICAL MEDICINE & REHABILITATION PROGRESS NOTE   Subjective/Complaints:  Left quad sore, feels therapy caused this, we discussed her chronic weakness AFO LLE, (current stroke did not cause this weakness)  ROS- neg CP SOB , N/V/D   Objective:   No results found. Recent Labs    07/12/18 0543  WBC 4.7  HGB 12.4  HCT 37.4  PLT 123*   Recent Labs    07/12/18 0543  NA 140  K 3.6  CL 110  CO2 27  GLUCOSE 102*  BUN 7*  CREATININE 0.68  CALCIUM 8.8*    Intake/Output Summary (Last 24 hours) at 07/13/2018 0754 Last data filed at 07/12/2018 1700 Gross per 24 hour  Intake 840 ml  Output -  Net 840 ml     Physical Exam: Vital Signs Blood pressure (!) 183/56, pulse 79, temperature 98.9 F (37.2 C), temperature source Oral, resp. rate 18, height 5' 3"  (1.6 m), weight 58.2 kg, SpO2 97 %.  General: No acute distress Mood and affect are appropriate Heart: Regular rate and rhythm no rubs murmurs or extra sounds Lungs: Clear to auscultation, breathing unlabored, no rales or wheezes Abdomen: Positive bowel sounds, soft nontender to palpation, nondistended Extremities: No clubbing, cyanosis, or edema Skin: No evidence of breakdown, no evidence of rash Neurologic: Cranial nerves II through XII intact, motor strength is 5/5 in bilateral deltoid, bicep, tricep, grip, hip flexor, knee extensors, R ankle dorsiflexor and plantar flexor, 0/5 Left ankle DF with equinus positioning MSK atrophy and tenderness Left Quad     Assessment/Plan: 1. Functional deficits secondary to Right occipital lobe infarct which require 3+ hours per day of interdisciplinary therapy in a comprehensive inpatient rehab setting.  Physiatrist is providing close team supervision and 24 hour management of active medical problems listed below.  Physiatrist and rehab team continue to assess barriers to discharge/monitor patient progress toward functional and medical goals  Care Tool:  Bathing   Body parts bathed by patient: Right arm, Left arm, Chest, Abdomen, Front perineal area, Buttocks, Right upper leg, Left upper leg, Right lower leg, Left lower leg, Face         Bathing assist Assist Level: Minimal Assistance - Patient > 75%     Upper Body Dressing/Undressing Upper body dressing   What is the patient wearing?: Pull over shirt    Upper body assist Assist Level: Set up assist    Lower Body Dressing/Undressing Lower body dressing      What is the patient wearing?: Pants     Lower body assist Assist for lower body dressing: Contact Guard/Touching assist     Toileting Toileting    Toileting assist Assist for toileting: Moderate Assistance - Patient 50 - 74%     Transfers Chair/bed transfer  Transfers assist     Chair/bed transfer assist level: Contact Guard/Touching assist     Locomotion Ambulation   Ambulation assist      Assist level: Supervision/Verbal cueing Assistive device: Walker-rolling Max distance: 120   Walk 10 feet activity   Assist     Assist level: Supervision/Verbal cueing Assistive device: Walker-rolling   Walk 50 feet activity   Assist    Assist level: Supervision/Verbal cueing Assistive device: Walker-rolling    Walk 150 feet activity   Assist Walk 150 feet activity did not occur: Safety/medical concerns         Walk 10 feet on uneven surface  activity   Assist Walk 10 feet on uneven surfaces activity did not occur:  Safety/medical concerns         Wheelchair     Assist Will patient use wheelchair at discharge?: Yes      Wheelchair assist level: Supervision/Verbal cueing Max wheelchair distance: 150'    Wheelchair 50 feet with 2 turns activity    Assist        Assist Level: Supervision/Verbal cueing   Wheelchair 150 feet activity     Assist     Assist Level: Supervision/Verbal cueing    Medical Problem List and Plan: 1.  Left-sided weakness and dysarthria secondary  to anterior right occipital lobe infarction secondary to small vessel disease status post TPA- on plavix             -CIR PT, OT, SLP- team conf inam 2.  DVT Prophylaxis/Anticoagulation: SCDs.  Monitor for any signs of DVT, 3. Pain Management: Tylenol as needed, sore L quad , order sportscream 4. Mood: Provide emotional support.  Anxiety lorazepam 1 mg daily as needed 5. Neuropsych: This patient is capable of making decisions on her own behalf. 6. Skin/Wound Care: Routine skin checks 7. Fluids/Electrolytes/Nutrition: Routine in and outs with follow-up chemistries ordered, CMET ok except alb a little low, prostat 8.  Seizure disorder.  Continue Trileptal 300 mg twice daily 9.  CAD with stenting/PSVT.  No chest pain.  Continue aspirin and Plavix 10.  Type 2 diabetes mellitus.  Hemoglobin A1c 4.6.  Blood sugar checks discontinued- diet controlled 11.  Hyperlipidemia.  Lipitor  12.  CBC normal except mild thrombocytopenia 13.  Multiple falls with pelvic and L femur fracture start Calcium citrate +Vit D 14.  Left spastic foot drop- Right MCA infarct 2015 with AFO- may benefit from botox as OP vs inpt, PRAFO LOS: 4 days A FACE TO FACE EVALUATION WAS PERFORMED  [name] 07/13/2018, 7:54 AM

## 2018-07-13 NOTE — Progress Notes (Signed)
Occupational Therapy Session Note  Patient Details  Name: [name] MRN: [id number] Date of Birth: 06-09-1941  Today's Date: 07/13/2018 OT Individual Time: 6808-8110 OT Individual Time Calculation (min): 60 min    Short Term Goals: Week 1:  OT Short Term Goal 1 (Week 1): n/a d/t ELOS  Skilled Therapeutic Interventions/Progress Updates:    Pt seen for ADL retraining with a focus on dynamic standing balance and functional mobility.  See ADL documentation below.  Pt stated she usually dresses from EOB which makes it easier for her to don foot wear so she will try that tomorrow. Pt opted to leave off shoes/ brace as she did not have PT until another 90 min.   Pt completed self care at a S level overall and then worked on standing balance exercises using RW. Pt worked on lateral weight shifts, tapping L foot forward and back and then R foot forward and back which she found the most challenging as she needed her LLE to support her body weight.  Pt completed exercises well and then rested in w/c.  Chair alarm on and all needs met.  - LTG of meal prep d/c'd as pt's family assist her and bathing goal changed from mod I to S as her family always has helped her with that for safety.  Pt states she feels she will be ready to go home by Thursday as she has strong family support.   Therapy Documentation Precautions:  Precautions Precautions: Fall Restrictions Weight Bearing Restrictions: No     Pain: Pain Assessment Pain Scale: 0-10 Pain Score: 0-No pain ADL: ADL Eating: Modified independent Grooming: Setup Where Assessed-Grooming: Sitting at sink Upper Body Bathing: Setup Where Assessed-Upper Body Bathing: Shower Lower Body Bathing: Supervision/safety Where Assessed-Lower Body Bathing: Shower Upper Body Dressing: Setup Where Assessed-Upper Body Dressing: Wheelchair Lower Body Dressing: Minimal assistance(min A with socks only) Where Assessed-Lower Body Dressing:  Wheelchair Toileting: Supervision/safety Where Assessed-Toileting: Glass blower/designer: Close supervision Armed forces technical officer Method: Arts development officer: Raised toilet seat Social research officer, government: Curator Method: Heritage manager: Radio broadcast assistant    Therapy/Group: Individual Therapy  [place] 07/13/2018, 12:10 PM

## 2018-07-13 NOTE — Progress Notes (Signed)
Inpatient [facility] Individual Statement of Services  Patient Name:  [name]  Date:  07/13/2018  Welcome to the [facility].  Our goal is to provide you with an individualized program based on your diagnosis and situation, designed to meet your specific needs.  With this comprehensive rehabilitation program, you will be expected to participate in at least 3 hours of rehabilitation therapies Monday-Friday, with modified therapy programming on the weekends.  Your rehabilitation program will include the following services:  Physical Therapy (PT), Occupational Therapy (OT), 24 hour per day rehabilitation nursing, Case Management (Social Worker), Rehabilitation Medicine, Nutrition Services and Pharmacy Services  Weekly team conferences will be held on Wednesdays to discuss your progress.  Your Social Worker will talk with you frequently to get your input and to update you on team discussions.  Team conferences with you and your family in attendance may also be held.  Expected length of stay: 5 to 10 days  Overall anticipated outcome: Supervision  Depending on your progress and recovery, your program may change. Your Social Worker will coordinate services and will keep you informed of any changes. Your Social Worker's name and contact numbers are listed  below.  The following services may also be recommended but are not provided by the [facility] will be made to provide these services after discharge if needed.  Arrangements include referral to agencies that provide these services.  Your insurance has been verified to be:  [employer] Your primary doctor is:  Dr. Redmond School  Pertinent information will be shared with your doctor and your insurance company.  Social Worker:  Alfonse Alpers, LCSW  [phone]  or ([phone]  Information discussed with and copy given to patient by: Trey Sailors, 07/13/2018, 3:34 PM

## 2018-07-13 NOTE — Plan of Care (Signed)
  Problem: Consults Goal: RH STROKE PATIENT EDUCATION Description See Patient Education module for education specifics  Outcome: Progressing   Problem: RH BOWEL ELIMINATION Goal: RH STG MANAGE BOWEL WITH ASSISTANCE Description STG Manage Bowel with min.Assistance.  Outcome: Progressing Flowsheets (Taken 07/13/2018 1125) STG: Pt will manage bowels with assistance: 6-Modified independent   Problem: RH BLADDER ELIMINATION Goal: RH STG MANAGE BLADDER WITH ASSISTANCE Description STG Manage Bladder With min.Assistance  Outcome: Progressing Flowsheets (Taken 07/13/2018 1125) STG: Pt will manage bladder with assistance: 6-Modified independent   Problem: RH SKIN INTEGRITY Goal: RH STG SKIN FREE OF INFECTION/BREAKDOWN Description With min. assist  Outcome: Progressing Goal: RH STG MAINTAIN SKIN INTEGRITY WITH ASSISTANCE Description STG Maintain Skin Integrity With min. Assistance.  Outcome: Progressing Flowsheets (Taken 07/13/2018 1125) STG: Maintain skin integrity with assistance: 6-Modified independent   Problem: RH SAFETY Goal: RH STG ADHERE TO SAFETY PRECAUTIONS W/ASSISTANCE/DEVICE Description STG Adhere to Safety Precautions With min. Assistance/Device.  Outcome: Progressing Flowsheets (Taken 07/13/2018 1125) STG:Pt will adhere to safety precautions with assistance/device: 4-Minimal assistance   Problem: RH PAIN MANAGEMENT Goal: RH STG PAIN MANAGED AT OR BELOW PT'S PAIN GOAL Description Less than 3  Outcome: Progressing   Problem: RH KNOWLEDGE DEFICIT Goal: RH STG INCREASE KNOWLEDGE OF STROKE PROPHYLAXIS Description With min. Assist.  Outcome: Progressing

## 2018-07-13 NOTE — Progress Notes (Signed)
Social Work Assessment and Plan  Patient Details  Name: [name] MRN: [id number] Date of Birth: [birth year]  Today's Date: 07/12/2018  Problem List:  Patient Active Problem List   Diagnosis Date Noted  . Tobacco use disorder 07/09/2018  . Small vessel disease ([place]) 07/09/2018  . Benign essential HTN   . Seizures ([place])   . Tachypnea   . Dysphagia, post-stroke   . Dysarthria 05/19/2018  . Syncope and collapse   . Syncope 08/24/2017  . Closed left femoral fracture ([place]) 04/21/2017  . Seizure disorder ([place]) 04/21/2017  . Closed pertrochanteric fracture of femur, left, initial encounter ([place]) 03/02/2017  . Seizure ([place]) 02/28/2017  . Hip fracture ([place]) 02/28/2017  . Chest pain 12/07/2016  . Atypical chest pain 12/07/2016  . Tachycardia   . NSTEMI (non-ST elevated myocardial infarction) ([place]) 11/02/2016  . Multifocal atrial tachycardia ([place]) 09/25/2016  . Elevated troponin 09/24/2016  . Hypokalemia 09/23/2016  . TIA (transient ischemic attack) 05/05/2016  . Intractable nausea and vomiting 10/15/2015  . Acute coronary syndrome ([place]) 06/01/2015  . Bronchitis 06/01/2015  . Thrombocytopenia ([place]) 06/01/2015  . Cerebral infarction ([place]) right occipital due to small vessel disease s/p tPA 03/06/2014  . Lower urinary tract infectious disease   . PSVT (paroxysmal supraventricular tachycardia) ([place]) 12/09/2013  . Esophageal varices ([place]) 03/29/2013  . Hematemesis 03/29/2013  . Hepatic cirrhosis ([place]) 03/29/2013  . Presence of stent in right coronary artery 07/04/2011  . OSA (obstructive sleep apnea) 07/04/2011  . Aortic sclerosis 07/04/2011  . Aortic insufficiency 07/04/2011  . HTN (hypertension) 07/04/2011  . Carotid stenosis, bilateral 07/04/2011  . Chest pain at rest 07/03/2011  . CAD (coronary artery disease) 07/03/2011  . Hypercholesteremia 07/03/2011  . GERD (gastroesophageal reflux disease) 07/03/2011   Past Medical History:  Past Medical History:  Diagnosis Date   . Anemia   . Aortic insufficiency    Moderate  . Back pain   . Carotid stenosis, bilateral   . Cirrhosis ([place])   . Closed left femoral fracture ([place]) 04/21/2017  . Coronary artery disease    Stent x 2 RCA 1995, cardiac catheterization 09/2016 showing only mild atherosclerosis  . DDD (degenerative disc disease), lumbar   . Essential hypertension   . Falls   . GERD (gastroesophageal reflux disease)   . Hiatal hernia   . History of stroke   . Hypercholesteremia   . IBS (irritable bowel syndrome)   . Non-alcoholic fatty liver disease   . OSA (obstructive sleep apnea)    no CPAP  . Pericardial effusion    a. small by echo 2018.  Marland Kitchen PSVT (paroxysmal supraventricular tachycardia) ([place])   . Recurrent UTI   . Seizures ([place])   . Stroke ([place])   . Type 2 diabetes mellitus ([place])    off medication  . Varices, esophageal ([facility])    Past Surgical History:  Past Surgical History:  Procedure Laterality Date  . [place]  . APPENDECTOMY    . BACK SURGERY  1985  . CARDIAC CATHETERIZATION  [phone]   Stent to the proximal RCA after MI   . CARDIAC CATHETERIZATION N/A 09/26/2016   Procedure: Left Heart Cath and Coronary Angiography;  Surgeon: Leonie Man, MD;  Location: [facility];  Service: Cardiovascular;  Laterality: N/A;  . CATARACT EXTRACTION W/PHACO Right 07/04/2014   Procedure: CATARACT EXTRACTION PHACO AND INTRAOCULAR LENS PLACEMENT (IOC);  Surgeon: Elta Guadeloupe T. Gershon Crane, MD;  Location: [facility];  Service: Ophthalmology;  Laterality: Right;  CDE:13.13  . COLONOSCOPY    . DILATION AND CURETTAGE OF UTERUS     x2  . Epi Retinal Membrane Peel Left   . ERCP    . ESOPHAGEAL BANDING N/A 04/01/2013   Procedure: ESOPHAGEAL BANDING;  Surgeon: Rogene Houston, MD;  [facility];  Service: Endoscopy;  Laterality: N/A;  . ESOPHAGEAL BANDING N/A 05/24/2013   Procedure: ESOPHAGEAL BANDING;  Surgeon: Rogene Houston, MD;  [facility];  Service:  Endoscopy;  Laterality: N/A;  . ESOPHAGEAL BANDING N/A 06/21/2014   Procedure: ESOPHAGEAL BANDING;  Surgeon: Rogene Houston, MD;  [facility];  Service: Endoscopy;  Laterality: N/A;  . ESOPHAGOGASTRODUODENOSCOPY N/A 04/01/2013   Procedure: ESOPHAGOGASTRODUODENOSCOPY (EGD);  Surgeon: Rogene Houston, MD;  [facility];  Service: Endoscopy;  Laterality: N/A;  230-rescheduled to 8:30am Ann notified pt  . ESOPHAGOGASTRODUODENOSCOPY N/A 05/24/2013   Procedure: ESOPHAGOGASTRODUODENOSCOPY (EGD);  Surgeon: Rogene Houston, MD;  [facility];  Service: Endoscopy;  Laterality: N/A;  730  . ESOPHAGOGASTRODUODENOSCOPY N/A 06/21/2014   Procedure: ESOPHAGOGASTRODUODENOSCOPY (EGD);  Surgeon: Rogene Houston, MD;  [facility];  Service: Endoscopy;  Laterality: N/A;  930-rescheduled 10/14 @ 1200 Ann to notify pt  . EYE SURGERY  08   cataract surgery of the left eye  . FEMUR IM NAIL Left 03/02/2017   Procedure: INTRAMEDULLARY (IM) NAIL FEMORAL;  Surgeon: Rod Can, MD;  Location: [facility];  Service: Orthopedics;  Laterality: Left;  . HARDWARE REMOVAL Right 01/17/2013   Procedure: REMOVAL OF HARDWARE AND EXCISION ULNAR STYLOID RIGHT WRIST;  Surgeon: Tennis Must, MD;  Location: [facility];  Service: Orthopedics;  Laterality: Right;  . MYRINGOTOMY  2012   both ears  . ORIF FEMUR FRACTURE Left 04/23/2017   Procedure: OPEN REDUCTION INTERNAL FIXATION (ORIF) DISTAL FEMUR FRACTURE (FRACTURE AROUND FEMORAL NAIL);  Surgeon: Altamese Iberia, MD;  Location: [facility];  Service: Orthopedics;  Laterality: Left;  . TONSILLECTOMY    . VAGINAL HYSTERECTOMY  1972  . WRIST SURGERY     rt wrist hardwear removal   Social History:  reports that she has been smoking cigarettes. She has a 12.50 pack-year smoking history. She has never used smokeless tobacco. She reports that she does not drink alcohol or use drugs.  Family / Support Systems Marital Status:  Widow/Widower Patient Roles: Parent, Other (Comment)(grandparent) Children: [name] - son- [phone] (wife is Bobbi); Candace Begue - son - [phone] Other Supports: granddtrs Anticipated Caregiver: [name] and her grand daughter Ability/Limitations of Caregiver: [name] unemployed an dlives across the street; 22 year old grand daughter in school to become Therapist, sports and works as a cna at [employer] Caregiver Availability: 24/7(if needed) Family Dynamics: close, supportive family  Social History Preferred language: English Religion: Methodist Read: Yes Write: Yes Employment Status: Retired Public relations account executive Issues: none reported Guardian/Conservator: N/A - MD has determined that pt is capable of making her own decisions.   Abuse/Neglect Abuse/Neglect Assessment Can Be Completed: Yes Physical Abuse: Denies Verbal Abuse: Denies Sexual Abuse: Denies Exploitation of patient/patient's resources: Denies Self-Neglect: Denies  Emotional Status Pt's affect, behavior and adjustment status: Pt is upbeat and positive about her rehabilitation and is motivated to get better.  She really wants to work hard and get home. Recent Psychosocial Issues: none reported Psychiatric History: none reported Substance Abuse History: none reported  Patient / Family Perceptions, Expectations & Goals Pt/Family understanding of illness & functional limitations:  Pt reports a good understanding of her condition and feels her questions have been answered. Premorbid pt/family roles/activities: Pt likes to read and watch television.  She enjoys spending time with her dogs and family. Pt goes out to each occasionally. Anticipated changes in roles/activities/participation: Pt wants to resume activities as she is able. Pt/family expectations/goals: Pt wants to get home to her two dogs.  Community [employer] Agencies: None Premorbid Home Care/DME Agencies: Other (Comment)(Pt has a w/c; rolling  walker; shower seat; raised toilet; bedside commode.  Also had [facility] in the past and would like to use again.) Transportation available at discharge: family Resource referrals recommended: Support group (specify)(stroke support groups)  Discharge Planning Living Arrangements: Other relatives(granddtr) Support Systems: Children, Other relatives, Friends/neighbors Type of Residence: Private residence Insurance Resources: Multimedia programmer (specify)(UHC Medicare) Financial Resources: Radio broadcast assistant Screen Referred: No Money Management: Patient Does the patient have any problems obtaining your medications?: No Home Management: Children/grandchildren manage the home. Patient/Family Preliminary Plans: Pt plans to return to her home with her granddtr, son Mortimer Fries), and other family to help. Social Work Anticipated Follow Up Needs: HH/OP, Support Group Expected length of stay: 5 to 10 days  Clinical Impression CSW met with pt to introduce self and role of CSW, as well as to complete assessment.  Pt is very appreciative of being here on CIR, but wants to get home as soon as possible.  She is motivated to get back to her two dogs.  Pt has good family support and will likely not be alone at home much, if at all.  Family helps with home management and meals.  Pt has all recommended DME at home already and will want [facility] for home therapies.  Pt has no concerns/questions/needs at this time.  CSW will continue to follow and assist as needed.  Tiffanny Lamarche, Silvestre Mesi 07/13/2018, 3:52 PM

## 2018-07-13 NOTE — Progress Notes (Signed)
Physical Therapy Session Note  Patient Details  Name: [name] MRN: [id number] Date of Birth: [birth year]  Today's Date: 07/13/2018 PT Individual Time: 1100-1200 PT Individual Time Calculation (min): 60 min   Short Term Goals: Week 1:  PT Short Term Goal 1 (Week 1): =LTGs due to ELOS  Skilled Therapeutic Interventions/Progress Updates:   Pt in supine and agreeable to therapy, pain in L quad 2-3/10 but tolerable, she feels it is more soreness. Pt performed all mobility w/ supervision this session and occasional set-up assist w/ increased time needed. Transferred to EOB and to w/c via stand pivot. Pt self-propelled w/c to/from therapy gym w/ BUEs. Performed NuStep 10 min @ level 3 for LE strengthening and gentle LLE ROM 2/2 L quad soreness. Worked on functional mobility w/ gait in household environment while weaving through cones x 50' and negotiating 4 steps w/ R handrail. Pt continues to ambulate at a very slow gait speed, but she reports this is her baseline. Needed min assist on stairs 2/2 pt's fear of falling. Discussed using both hands on rail while descending to assist w/ confidence and able to do so w/ CGA when using this technique. Performed 5x sit<>stand, 48.85 sec, and explained significance of results w/ pt including increased fall risk w/ functional mobility. Returned to room and ended session in w/c, all needs in reach.   Therapy Documentation Precautions:  Precautions Precautions: Fall Restrictions Weight Bearing Restrictions: No Pain: Pain Assessment Pain Scale: 0-10 Pain Score: 0-No pain  Therapy/Group: Individual Therapy  Zaydon Kinser K Marysa Wessner 07/13/2018, 12:00 PM

## 2018-07-13 NOTE — Plan of Care (Signed)
Gait and dynamic standing LTGs downgraded to supervision as pt's family states she will not be home by herself. See Plan of Care.   Burnard Bunting, PT, DPT

## 2018-07-13 NOTE — Progress Notes (Signed)
Physical Therapy Session Note  Patient Details  Name: [name] MRN: [id number] Date of Birth: 08/27/1941  Today's Date: 07/13/2018 PT Individual Time: 1300-1405 PT Individual Time Calculation (min): 65 min   Short Term Goals: Week 1:  PT Short Term Goal 1 (Week 1): =LTGs due to ELOS  Skilled Therapeutic Interventions/Progress Updates:    Pt seated in w/c upon PT arrival, agreeable to therapy tx and denies pain. Pt seated in w/c donned socks, shoes and L AFO with min assist. Pt propelled w/c from room>gym x 150 ft with B UEs and increased time, supervision. Pt ascended/descended 4 steps (6 inch) x 2 with min assist using R handrail and step to pattern, verbal cues for techniques, increased time to complete. Pt propelled w/c within gym with supervision. Pt ambulated x 60 ft with RW and supervision, decreased step length and gait speed, increased time to complete. Pt reports feeling nauseous, performed seated exercises LAQ, hip flexion and ankle pumps, 2 x 10 each. Pt propelled w/c back to room with supervision x 150 ft. Pt performed stand pivot transfer back to bed with RW and supervision. Pt transferred to supine and left supine with needs in reach and bed alarm set.   Therapy Documentation Precautions:  Precautions Precautions: Fall Restrictions Weight Bearing Restrictions: No    Therapy/Group: Individual Therapy  Netta Corrigan, PT, DPT 07/13/2018, 7:58 AM

## 2018-07-14 ENCOUNTER — Inpatient Hospital Stay (HOSPITAL_COMMUNITY): Payer: Self-pay

## 2018-07-14 ENCOUNTER — Inpatient Hospital Stay (HOSPITAL_COMMUNITY): Payer: Self-pay | Admitting: Occupational Therapy

## 2018-07-14 ENCOUNTER — Inpatient Hospital Stay (HOSPITAL_COMMUNITY): Payer: Self-pay | Admitting: Physical Therapy

## 2018-07-14 NOTE — Progress Notes (Signed)
Physical Therapy Discharge Summary  Patient Details  Name: [name] MRN: [id number] Date of Birth: 1941-02-25  Today's Date: 07/14/2018 PT Individual Time: 1000-1056 and 1530-1600 PT Individual Time Calculation (min): 56 min and 30 min   Patient has met 9 of 10 long term goals due to improved activity tolerance, improved balance, increased strength and ability to compensate for deficits.  Patient to discharge at a wheelchair level Modified Independent.   Patient's care partner is independent to provide the necessary physical assistance at discharge. Family will provide supervision for ambulation and can provide min guard assist for stairs.   Reasons goals not met: Pt did not meet stair goal as she will require min guard assist for safety.   Recommendation:  Patient will benefit from ongoing skilled PT services in home health setting to continue to advance safe functional mobility, address ongoing impairments in balance, strength , and minimize fall risk.  Equipment: No equipment provided  Reasons for discharge: treatment goals met  Patient/family agrees with progress made and goals achieved: Yes   Session 1: Pt supine in bed upon PT arrival, agreeable to therapy tx and denies pain. Pt transferred to EOB independently and donned shoes, socks and AFO with increased time. Pt performed sit<>stand with supervision and ambulated into bathroom with RW and supervision. Pt performed all toileting tasks/clothing management with supervision. Pt ambulated to sink with RW and supervision to wash hands and brush teeth. Pt propelled w/c to the gym mod I x 150 ft using B UEs with increased time. Pt transported to ortho gym and performed car transfer via ambulation with RW and supervision. Pt ascended/descended 3 steps x 2 this session with R handrail and CGA, verbal cues for techniques, step to pattern. Pt ambulated x 60 ft with RW and supervision, decreased step length and decreased gait speed. Pt  performed chair<>mat transfer mod I and performed bed mobility mod I. Pt transported back to room and left seated with needs in reach.   Session 2: Pt seated in w/c upon PT arrival, agreeable to therapy tx and denies pain. Pt propelled w/c to the gym x 150 ft mod I and then performed w/c<>bed transfer mod I. Pt worked on bed mobility on the mat mod I to simulate home set up. Pt performed 2 x 5 sit<>stands from mat for LE strengthening without device, supervision. Pt ambulated x 50 ft with RW and supervision, increased time to complete. Pt transported back to room and transferred to bed mod I stand pivot. Pt left supine in bed with needs in reach and bed alarm set.   PT Discharge Precautions/Restrictions Precautions Precautions: Fall Restrictions Weight Bearing Restrictions: No Pain Pain Assessment Pain Scale: 0-10 Pain Score: 0-No pain Cognition Overall Cognitive Status: History of cognitive impairments - at baseline Arousal/Alertness: Awake/alert Orientation Level: Oriented X4 Sensation Sensation Light Touch: Appears Intact Proprioception: Appears Intact Coordination [name] Motor Movements are Fluid and Coordinated: No Fine Motor Movements are Fluid and Coordinated: No Motor  Motor Motor: Hemiplegia Motor - Discharge Observations: mild L hemi  Mobility Bed Mobility Rolling Right: Independent Rolling Left: Independent Supine to Sit: Independent Sit to Supine: Independent Transfers Sit to Stand: Independent with assistive device Stand to Sit: Independent with assistive device Stand Pivot Transfers: Independent with assistive device Transfer (Assistive device): Rolling walker Locomotion  Gait Ambulation: Yes Gait Assistance: Supervision/Verbal cueing Gait Distance (Feet): 60 Feet Assistive device: Rolling walker Gait Gait: Yes Gait Pattern: Impaired Gait Pattern: Decreased dorsiflexion - left;Step-to pattern Gait velocity: very  decreased Stairs / Additional  Locomotion Stairs: Yes Stairs Assistance: Contact Guard/Touching assist Stair Management Technique: One rail Right Number of Stairs: 4 Height of Stairs: 6  Trunk/Postural Assessment  Cervical Assessment Cervical Assessment: Exceptions to WFL(forward head) Thoracic Assessment Thoracic Assessment: Exceptions to WFL(rounded shoulders) Lumbar Assessment Lumbar Assessment: Exceptions to WFL(posterior pelvic tilt) Postural Control Postural Control: Within Functional Limits  Balance Balance Balance Assessed: Yes Extremity Assessment      RLE Assessment RLE Assessment: Within Functional Limits LLE Assessment LLE Assessment: Exceptions to [facility] Passive Range of Motion (PROM) Comments: ankle limited to neutral  General Strength Comments: globally 4/5     Netta Corrigan, PT, DPT 07/14/2018, 11:39 AM

## 2018-07-14 NOTE — Progress Notes (Signed)
Occupational Therapy Discharge Summary  Patient Details  Name: [name] MRN: [id number] Date of Birth: May 01, 1941     Patient has met 26 of 10 long term goals due to improved activity tolerance, improved balance, postural control, ability to compensate for deficits, functional use of  LEFT upper extremity and improved coordination.  Patient to discharge at overall Supervision level.  Patient's care partner is independent to provide the necessary physical assistance at discharge.    Reasons goals not met: n/a  Recommendation:  Patient will benefit from ongoing skilled OT services in home health setting to continue to advance functional skills in the area of BADL and iADL.  Equipment: No equipment provided  Reasons for discharge: treatment goals met  Patient/family agrees with progress made and goals achieved: Yes  OT Discharge ADL ADL Eating: Independent Grooming: Independent Where Assessed-Grooming: Sitting at sink Upper Body Bathing: Independent Where Assessed-Upper Body Bathing: Shower Lower Body Bathing: Supervision/safety Where Assessed-Lower Body Bathing: Shower Upper Body Dressing: Independent Where Assessed-Upper Body Dressing: Edge of bed Lower Body Dressing: Independent Where Assessed-Lower Body Dressing: Edge of bed Toileting: Independent Where Assessed-Toileting: Glass blower/designer: Diplomatic Services operational officer Method: Arts development officer: Raised Counselling psychologist: Close supervision Social research officer, government Method: Heritage manager: Radio broadcast assistant Vision Baseline Vision/History: Wears glasses Wears Glasses: At all times Patient Visual Report: No change from baseline Vision Assessment?: No apparent visual deficits Perception  Perception: Within Functional Limits Praxis Praxis: Intact Cognition Overall Cognitive Status: History of cognitive impairments - at  baseline Arousal/Alertness: Awake/alert Orientation Level: Oriented X4 Sensation Sensation Light Touch: Appears Intact Hot/Cold: Appears Intact Proprioception: Appears Intact Stereognosis: Appears Intact Coordination Gross Motor Movements are Fluid and Coordinated: No Fine Motor Movements are Fluid and Coordinated: No Coordination and Movement Description: UE coordination functional for BADLs Motor  Motor Motor: Hemiplegia Motor - Discharge Observations: mild L hemi Mobility  Bed Mobility Rolling Right: Independent Rolling Left: Independent Supine to Sit: Independent Sit to Supine: Independent Transfers Sit to Stand: Independent with assistive device Stand to Sit: Independent with assistive device  Trunk/Postural Assessment  Cervical Assessment Cervical Assessment: Exceptions to WFL(forward head) Thoracic Assessment Thoracic Assessment: Exceptions to WFL(rounded shoulders) Lumbar Assessment Lumbar Assessment: Exceptions to WFL(posterior pelvic tilt) Postural Control Postural Control: Within Functional Limits  Balance Balance Balance Assessed: Yes Dynamic Sitting Balance Dynamic Sitting - Level of Assistance: 7: Independent Dynamic Standing Balance Dynamic Standing - Level of Assistance: 6: Modified independent (Device/Increase time) Extremity/Trunk Assessment RUE Assessment RUE Assessment: Within Functional Limits LUE Assessment LUE Assessment: Within Functional Limits   SAGUIER,JULIA 07/14/2018, 12:59 PM

## 2018-07-14 NOTE — Discharge Instructions (Signed)
Inpatient Rehab Discharge Instructions  [facility] Discharge date and time: No discharge date for patient encounter.   Activities/Precautions/ Functional Status: Activity: activity as tolerated Diet: regular diet Wound Care: none needed Functional status:  ___ No restrictions     ___ Walk up steps independently ___ 24/7 supervision/assistance   ___ Walk up steps with assistance ___ Intermittent supervision/assistance  ___ Bathe/dress independently ___ Walk with walker     _x  STROKE/TIA DISCHARGE INSTRUCTIONS SMOKING Cigarette smoking nearly doubles your risk of having a stroke & is the single most alterable risk factor  If you smoke or have smoked in the last 12 months, you are advised to quit smoking for your health.  Most of the excess cardiovascular risk related to smoking disappears within a year of stopping.  Ask you doctor about anti-smoking medications  [place] Quit Line: 1-800-QUIT NOW  Free Smoking Cessation Classes [phone]  CHOLESTEROL Know your levels; limit fat & cholesterol in your diet  Lipid Panel     Component Value Date/Time   CHOL 109 07/07/2018 0432   CHOL 142 12/07/2013 1038   TRIG 50 07/07/2018 0432   TRIG 98 12/07/2013 1038   HDL 49 07/07/2018 0432   HDL 47 12/07/2013 1038   CHOLHDL 2.2 07/07/2018 0432   VLDL 10 07/07/2018 0432   LDLCALC 50 07/07/2018 0432   LDLCALC 83 09/04/2017 1023   [place] 75 12/07/2013 1038      Many patients benefit from treatment even if their cholesterol is at goal.  Goal: Total Cholesterol (CHOL) less than 160  Goal:  Triglycerides (TRIG) less than 150  Goal:  HDL greater than 40  Goal:  LDL (LDLCALC) less than 100   BLOOD PRESSURE American Stroke Association blood pressure target is less that 120/80 mm/Hg  Your discharge blood pressure is:  BP: (!) 165/62  Monitor your blood pressure  Limit your salt and alcohol intake  Many individuals will require more than one medication for high blood pressure    DIABETES (A1c is a blood sugar average for last 3 months) Goal HGBA1c is under 7% (HBGA1c is blood sugar average for last 3 months)  Diabetes: No known diagnosis of diabetes    Lab Results  Component Value Date   HGBA1C 4.6 (L) 07/07/2018     Your HGBA1c can be lowered with medications, healthy diet, and exercise.  Check your blood sugar as directed by your physician  Call your physician if you experience unexplained or low blood sugars.  PHYSICAL ACTIVITY/REHABILITATION Goal is 30 minutes at least 4 days per week  Activity: Increase activity slowly, Therapies: Physical Therapy: Home Health Return to work:   Activity decreases your risk of heart attack and stroke and makes your heart stronger.  It helps control your weight and blood pressure; helps you relax and can improve your mood.  Participate in a regular exercise program.  Talk with your doctor about the best form of exercise for you (dancing, walking, swimming, cycling).  DIET/WEIGHT Goal is to maintain a healthy weight  Your discharge diet is:  Diet Order            Diet regular Room service appropriate? Yes; Fluid consistency: Thin  Diet effective now              liquids Your height is:  Height: 5' 3"  (160 cm) Your current weight is: Weight: 58.2 kg Your Body Mass Index (BMI) is:  BMI (Calculated): 22.72  Following the type of diet specifically designed  for you will help prevent another stroke.  Your goal weight range is:    Your goal Body Mass Index (BMI) is 19-24.  Healthy food habits can help reduce 3 risk factors for stroke:  High cholesterol, hypertension, and excess weight.  RESOURCES Stroke/Support Group:  Call [phone]   STROKE EDUCATION PROVIDED/REVIEWED AND GIVEN TO PATIENT Stroke warning signs and symptoms How to activate emergency medical system (call 911). Medications prescribed at discharge. Need for follow-up after discharge. Personal risk factors for stroke. Pneumonia vaccine given:   Flu vaccine given:  My questions have been answered, the writing is legible, and I understand these instructions.  I will adhere to these goals & educational materials that have been provided to me after my discharge from the hospital.   __ Bathe/dress with assistance ___ Walk Independently    ___ Shower independently ___ Walk with assistance    ___ Shower with assistance ___ No alcohol     ___ Return to work/school ________  COMMUNITY REFERRALS UPON DISCHARGE:   Home Health:   PT     OT     Agency:  [facility] Phone:  [phone] Medical Equipment/Items Ordered:  You have all recommended medical equipment at home.   GENERAL COMMUNITY RESOURCES FOR PATIENT/FAMILY: Support Groups:  [facility] Stroke Support Group Meets the second Thursday of each month from 6 - 7 PM (except June, July, August) [facility] [place], [address], [place] For more information, call [phone]  [facility] [facility] Support Group (for survivors, family members, and caregivers) Meets the third Thursday of each month from 1:30 - 3 PM [facility], [place] [address] [place] For more information, call Ihor Austin at [phone] or Mauricio Po at [phone]  Special Instructions:  No driving  My questions have been answered and I understand these instructions. I will adhere to these goals and the provided educational materials after my discharge from the hospital.  Patient/Caregiver Signature _______________________________ Date __________  Clinician Signature _______________________________________ Date __________  Please bring this form and your medication list with you to all your follow-up doctor's appointments.

## 2018-07-14 NOTE — Discharge Summary (Signed)
Discharge summary job [id number]

## 2018-07-14 NOTE — Progress Notes (Signed)
Physical Therapy Session Note  Patient Details  Name: [name] MRN: [id number] Date of Birth: 1940-12-01  Today's Date: 07/14/2018 PT Individual Time: 8346-2194 PT Individual Time Calculation (min): 45 min   Short Term Goals: Week 1:  PT Short Term Goal 1 (Week 1): =LTGs due to ELOS  Skilled Therapeutic Interventions/Progress Updates:    Pt received seated in bed finishing breakfast, agreeable to PT. No complaints of pain. Bed mobility Supervision. Pt is able to perform UB and LB dressing while seated EOB with setup assist. Sit to stand with Supervision to RW. Ambulation from bed to bathroom with Supervision and RW with kyphotic posture and decreased gait speed. Toilet transfer with Supervision with use of RW and increased time needed to complete transfer due to low seat height. Pt is Supervision for 3/3 toileting steps. Ambulation x 100 ft with RW and Supervision with significant amount of time needed to complete (pt's baseline). Ascend/descend 3 3" stairs with R handrail and min A for balance, step-to gait pattern. Standing BLE therex x 10 reps: marches, HS curls. Assisted pt back to bed at end of therapy session, Supervision for SPT with RW and sit to supine. Pt left semi-reclined in bed with needs in reach and bed alarm in place.  Therapy Documentation Precautions:  Precautions Precautions: Fall Restrictions Weight Bearing Restrictions: No   Therapy/Group: Individual Therapy  Excell Seltzer, PT, DPT  07/14/2018, 9:08 AM

## 2018-07-14 NOTE — Progress Notes (Signed)
[place] PHYSICAL MEDICINE & REHABILITATION PROGRESS NOTE   Subjective/Complaints:  Left quad sore, feels better  Has urinary freq but no pain, bowels doing ok ROS- neg CP SOB , N/V/D   Objective:   No results found. Recent Labs    07/12/18 0543  WBC 4.7  HGB 12.4  HCT 37.4  PLT 123*   Recent Labs    07/12/18 0543  NA 140  K 3.6  CL 110  CO2 27  GLUCOSE 102*  BUN 7*  CREATININE 0.68  CALCIUM 8.8*    Intake/Output Summary (Last 24 hours) at 07/14/2018 0955 Last data filed at 07/13/2018 1824 Gross per 24 hour  Intake 480 ml  Output -  Net 480 ml     Physical Exam: Vital Signs Blood pressure (!) 152/60, pulse 78, temperature 98.3 F (36.8 C), temperature source Oral, resp. rate 16, height 5' 3"  (1.6 m), weight 58.4 kg, SpO2 97 %.  General: No acute distress Mood and affect are appropriate Heart: Regular rate and rhythm no rubs murmurs or extra sounds Lungs: Clear to auscultation, breathing unlabored, no rales or wheezes Abdomen: Positive bowel sounds, soft nontender to palpation, nondistended Extremities: No clubbing, cyanosis, or edema Skin: No evidence of breakdown, no evidence of rash Neurologic: Cranial nerves II through XII intact, motor strength is 5/5 in bilateral deltoid, bicep, tricep, grip, hip flexor, knee extensors, R ankle dorsiflexor and plantar flexor, 0/5 Left ankle DF with equinus positioning MSK atrophy and tenderness Left Quad     Assessment/Plan: 1. Functional deficits secondary to Right occipital lobe infarct which require 3+ hours per day of interdisciplinary therapy in a comprehensive inpatient rehab setting.  Physiatrist is providing close team supervision and 24 hour management of active medical problems listed below.  Physiatrist and rehab team continue to assess barriers to discharge/monitor patient progress toward functional and medical goals  Care Tool:  Bathing    Body parts bathed by patient: Right arm, Left arm,  Chest, Abdomen, Front perineal area, Buttocks, Right upper leg, Left upper leg, Right lower leg, Left lower leg, Face         Bathing assist Assist Level: Supervision/Verbal cueing     Upper Body Dressing/Undressing Upper body dressing   What is the patient wearing?: Pull over shirt    Upper body assist Assist Level: Set up assist    Lower Body Dressing/Undressing Lower body dressing      What is the patient wearing?: Pants     Lower body assist Assist for lower body dressing: Set up assist     Toileting Toileting    Toileting assist Assist for toileting: Supervision/Verbal cueing     Transfers Chair/bed transfer  Transfers assist     Chair/bed transfer assist level: Supervision/Verbal cueing     Locomotion Ambulation   Ambulation assist      Assist level: Supervision/Verbal cueing Assistive device: Walker-rolling Max distance: 100'   Walk 10 feet activity   Assist     Assist level: Supervision/Verbal cueing Assistive device: Walker-rolling   Walk 50 feet activity   Assist    Assist level: Supervision/Verbal cueing Assistive device: Walker-rolling    Walk 150 feet activity   Assist Walk 150 feet activity did not occur: Safety/medical concerns         Walk 10 feet on uneven surface  activity   Assist Walk 10 feet on uneven surfaces activity did not occur: Safety/medical concerns         Wheelchair  Assist Will patient use wheelchair at discharge?: Yes Type of Wheelchair: Manual    Wheelchair assist level: Supervision/Verbal cueing Max wheelchair distance: 150'    Wheelchair 50 feet with 2 turns activity    Assist        Assist Level: Supervision/Verbal cueing   Wheelchair 150 feet activity     Assist     Assist Level: Supervision/Verbal cueing    Medical Problem List and Plan: 1.  Left-sided weakness and dysarthria secondary to anterior right occipital lobe infarction secondary to small  vessel disease status post TPA- on plavix             -CIR PT, OT, SLP- Team conference today please see physician documentation under team conference tab, met with team face-to-face to discuss problems,progress, and goals. Formulized individual treatment plan based on medical history, underlying problem and comorbidities. 2.  DVT Prophylaxis/Anticoagulation: SCDs.  Monitor for any signs of DVT, 3. Pain Management: Tylenol as needed, sore L quad , cont sportscream 4. Mood: Provide emotional support.  Anxiety lorazepam 1 mg daily as needed 5. Neuropsych: This patient is capable of making decisions on her own behalf.Cognitive deficits due to CVA 6. Skin/Wound Care: Routine skin checks 7. Fluids/Electrolytes/Nutrition: Routine in and outs with follow-up chemistries ordered, CMET ok except alb a little low, prostat 8.  Seizure disorder.  Continue Trileptal 300 mg twice daily 9.  CAD with stenting/PSVT.  No chest pain.  Continue aspirin and Plavix 10.  Type 2 diabetes mellitus.  Hemoglobin A1c 4.6.  Blood sugar checks discontinued- diet controlled 11.  Hyperlipidemia.  Lipitor  12.  CBC normal except mild thrombocytopenia 13.  Multiple falls with pelvic and L femur fracture start Calcium citrate +Vit D 14.  Left spastic foot drop- Right MCA infarct 2015 with AFO- may benefit from botox as OP vs inpt, PRAFO LOS: 5 days A FACE TO FACE EVALUATION WAS PERFORMED  Charlett Blake 07/14/2018, 9:55 AM

## 2018-07-14 NOTE — Discharge Summary (Signed)
NAME: [name], [name] YD:[id number] ACCOUNT [id number] DATE OF BIRTH:[birth year] FACILITY: [facility] LOCATION: [facility] PHYSICIAN:ANDREW Letta Pate, MD  DISCHARGE SUMMARY  DATE OF DISCHARGE:  07/15/2018  DISCHARGE DIAGNOSES:   1.  Anterior right occipital lobe infarction due to small vessel disease. 2.  Sequential compression devices for deep venous thrombosis prophylaxis.   3.  Seizure disorder. 4.   Coronary artery disease with stenting. 5.  Type 2 diabetes mellitus. 6.  Hyperlipidemia. 7.  Left spastic foot drop.  HISTORY OF PRESENT ILLNESS:  This is a 77 year old right-handed female with history of aortic insufficiency, CAD with stenting, maintained on aspirin and Plavix, hypertension, diabetes mellitus, seizure disorder.  Lives alone, independent with assistive  device prior to admission.  Uses a wheelchair for longer distances.  Son lives next door.    The patient states that plan is to go home with his son who can provide assistance.    HOSPITAL COURSE:  Presented 07/06/2018 with acute onset of left facial droop, slurred speech, left-sided weakness.  Cranial CT scan unremarkable.  Per report remote lacunar infarctions at the basal ganglion cerebellum bilaterally.  The patient did  receive tPA.  CT angiogram of head and neck showed no emergent large vessel occlusion;  60% stenosis of left internal carotid artery.  MRI showed punctate new white matter infarction in the anterior right occipital lobe.  No hemorrhage or mass effect.   Echocardiogram with ejection fraction of 70%, grade II diastolic dysfunction.  EEG negative for seizure.    The patient remained on aspirin and Plavix therapy for CVA prophylaxis.  Therapy evaluations completed and the patient was admitted for comprehensive rehabilitation program.  PAST MEDICAL HISTORY:  See discharge diagnoses.  SOCIAL HISTORY:  Lives alone with good support of his son.  FUNCTIONAL STATUS:  Upon admission to rehab services was  minimal assist 20 feet rolling walker, minimal assist, supine to sit min mod assist activities of daily living.  PHYSICAL EXAMINATION: VITAL SIGNS:  Blood pressure 181/60, pulse 89, temperature 98, respirations 18. GENERAL:  Alert female in no acute distress.  Makes good eye contact with examiner. HEENT:  EOMs intact. NECK:  Supple, nontender, no JVD. CARDIOVASCULAR:  Rate controlled. ABDOMEN:  Soft, nontender, good bowel sounds. LUNGS:  Clear to auscultation without wheeze.  REHABILITATION HOSPITAL COURSE:  The patient was admitted to inpatient rehabilitation services.  Therapies initiated on a 3-hour daily basis, consisting of physical therapy, occupational therapy and rehabilitation nursing.  The following issues were  addressed during patient's rehabilitation stay:    Pertaining to the patient's right occipital lobe infarction, remained stable, maintained on aspirin and Plavix therapy.  He would follow up with neurology services.  Blood pressures controlled and Coreg resumed.  SCDs for DVT prophylaxis.    Noted history of seizure disorder.  He remained on Trileptal.  No seizure activity noted.  EEG negative.    No chest pain or shortness of breath with noted history of CAD with stenting.  He will continue aspirin and Plavix.    Type 2 diabetes mellitus.  Hemoglobin A1c 4.6.  Blood sugars well controlled.    He will continue Lipitor for hyperlipidemia.    Spastic left foot from right MCA infarction in 2015.  AFO brace in place.    The patient received weekly collaborative interdisciplinary team conferences to discuss estimated length of stay, family teaching, any barriers to discharge.  Performed all mobility, supervision.  Transferred edge of bed, wheelchair stand pivot.   Propelled his wheelchair independently continues  to ambulate at a slow gait.  Needed some minimal assistance, contact guard assist overall.  Gathered belongings for activities of daily living and homemaking.  It was  advised no driving.  Tolerating a  regular diet.  Family teaching completed.  DISCHARGE MEDICATIONS:  Included aspirin 81 mg p.o. daily, Lipitor 80 mg p.o. daily, calcium titrate 200 mg p.o. daily, Plavix 75 mg p.o. daily, multivitamin daily, Trileptal 300 mg p.o. B.i.d.,Coreg 12.5 mg twice a day, Ativan 1 mg daily as needed.  DIET:  Regular consistency.    FOLLOWUP:  He would follow up with Dr. Alysia Penna at the outpatient rehab center as directed; Dr. Antony Contras, neurology services, call for appointment; Dr. Redmond School, medical management.  SPECIAL INSTRUCTIONS:  No driving.  AN/NUANCE V:20/11/7942 T:07/14/2018 JOB:[id number]

## 2018-07-14 NOTE — Progress Notes (Signed)
Occupational Therapy Session Note  Patient Details  Name: Ghina Bittinger MRN: [id number] Date of Birth: 08-Apr-1941  Today's Date: 07/14/2018 OT Individual Time: 1100-1200 OT Individual Time Calculation (min): 60 min    Short Term Goals: Week 1:  OT Short Term Goal 1 (Week 1): n/a d/t ELOS  Skilled Therapeutic Interventions/Progress Updates:    Pt seen this session for BADL training with a focus on pt completing tasks at a S level with bathing, shower stall transfers and mod I for toileting. Pt demonstrated proficient, safe skills using her RW.  Pt is feeling ready to go home tomorrow and will have full time S from her family.  Therapy Documentation Precautions:  Precautions Precautions: Fall Restrictions Weight Bearing Restrictions: No   Pain: Pain Assessment Pain Scale: 0-10 Pain Score: 0-No pain    Therapy/Group: Individual Therapy  Billyjoe Go 07/14/2018, 11:11 AM

## 2018-07-15 DIAGNOSIS — Z23 Encounter for immunization: Secondary | ICD-10-CM | POA: Diagnosis not present

## 2018-07-15 MED ORDER — LORAZEPAM 1 MG PO TABS: 1.0000 mg | ORAL_TABLET | Freq: Every day | ORAL | 0 refills | Status: DC | PRN

## 2018-07-15 MED ORDER — CARVEDILOL 12.5 MG PO TABS: 12.5000 mg | ORAL_TABLET | Freq: Two times a day (BID) | ORAL | 1 refills | Status: DC

## 2018-07-15 MED ORDER — CLOPIDOGREL BISULFATE 75 MG PO TABS: 75.0000 mg | ORAL_TABLET | Freq: Every day | ORAL | 1 refills | Status: DC

## 2018-07-15 MED ORDER — CALCIUM CITRATE 950 (200 CA) MG PO TABS: 1.0000 | ORAL_TABLET | Freq: Every day | ORAL | 1 refills | Status: DC

## 2018-07-15 MED ORDER — ATORVASTATIN CALCIUM 80 MG PO TABS: 80.0000 mg | ORAL_TABLET | Freq: Every day | ORAL | 1 refills | Status: DC

## 2018-07-15 MED ORDER — ACETAMINOPHEN 325 MG PO TABS: 650.0000 mg | ORAL_TABLET | Freq: Four times a day (QID) | ORAL | Status: DC | PRN

## 2018-07-15 MED ORDER — OXCARBAZEPINE 300 MG PO TABS: 300.0000 mg | ORAL_TABLET | Freq: Two times a day (BID) | ORAL | 1 refills | Status: DC

## 2018-07-15 NOTE — Progress Notes (Signed)
Social Work Patient ID: [name], female   DOB: [birth year], 77 y.o.   MRN: [id number]   CSW met with pt and called her son Mortimer Fries 07-14-18 to update them on team conference discussion with targeted d/c date of 07-15-18.  Pt was happy to be going home and son said family is prepared for her there.  They will be with pt 24/7.  Pt also uses her w/c in the home a lot to get around and she can be mod I in w/c.  HH to follow pt at home for therapies.  CSW remains available to assist as needed.

## 2018-07-15 NOTE — Progress Notes (Signed)
[place] PHYSICAL MEDICINE & REHABILITATION PROGRESS NOTE   Subjective/Complaints:  No new issues overnite  ROS- neg CP SOB , N/V/D   Objective:   No results found. No results for input(s): WBC, HGB, HCT, PLT in the last 72 hours. No results for input(s): NA, K, CL, CO2, GLUCOSE, BUN, CREATININE, CALCIUM in the last 72 hours.  Intake/Output Summary (Last 24 hours) at 07/15/2018 0816 Last data filed at 07/14/2018 1734 Gross per 24 hour  Intake 720 ml  Output -  Net 720 ml     Physical Exam: Vital Signs Blood pressure (!) 158/53, pulse 81, temperature 98.3 F (36.8 C), temperature source Oral, resp. rate 17, height 5' 3"  (1.6 m), weight 58.4 kg, SpO2 97 %.  General: No acute distress Mood and affect are appropriate Heart: Regular rate and rhythm no rubs murmurs or extra sounds Lungs: Clear to auscultation, breathing unlabored, no rales or wheezes Abdomen: Positive bowel sounds, soft nontender to palpation, nondistended Extremities: No clubbing, cyanosis, or edema Skin: No evidence of breakdown, no evidence of rash Neurologic: Cranial nerves II through XII intact, motor strength is 5/5 in bilateral deltoid, bicep, tricep, grip, hip flexor, knee extensors, R ankle dorsiflexor and plantar flexor, 0/5 Left ankle DF with equinus positioning MSK atrophy and tenderness Left Quad     Assessment/Plan: 1. Functional deficits secondary to Right occipital lobe infarct Stable for D/C today F/u PCP in 3-4 weeks F/u PM&R 2 weeks See D/C summary See D/C instructions Care Tool:  Bathing    Body parts bathed by patient: Right arm, Left arm, Chest, Abdomen, Front perineal area, Buttocks, Right upper leg, Left upper leg, Right lower leg, Left lower leg, Face         Bathing assist Assist Level: Supervision/Verbal cueing     Upper Body Dressing/Undressing Upper body dressing   What is the patient wearing?: Pull over shirt    Upper body assist Assist Level: Set up assist   Lower Body Dressing/Undressing Lower body dressing      What is the patient wearing?: Pants, Underwear/pull up     Lower body assist Assist for lower body dressing: Supervision/Verbal cueing     Toileting Toileting    Toileting assist Assist for toileting: Independent with assistive device     Transfers Chair/bed transfer  Transfers assist     Chair/bed transfer assist level: Independent with assistive device     Locomotion Ambulation   Ambulation assist      Assist level: Supervision/Verbal cueing Assistive device: Walker-rolling Max distance: 60 ft   Walk 10 feet activity   Assist     Assist level: Supervision/Verbal cueing Assistive device: Walker-rolling   Walk 50 feet activity   Assist    Assist level: Supervision/Verbal cueing Assistive device: Walker-rolling    Walk 150 feet activity   Assist Walk 150 feet activity did not occur: Safety/medical concerns         Walk 10 feet on uneven surface  activity   Assist Walk 10 feet on uneven surfaces activity did not occur: Safety/medical concerns         Wheelchair     Assist Will patient use wheelchair at discharge?: Yes Type of Wheelchair: Manual    Wheelchair assist level: Independent Max wheelchair distance: 150'    Wheelchair 50 feet with 2 turns activity    Assist        Assist Level: Independent   Wheelchair 150 feet activity     Assist  Assist Level: Independent    Medical Problem List and Plan: 1.  Left-sided weakness and dysarthria secondary to anterior right occipital lobe infarction secondary to small vessel disease status post TPA- on plavix             -CIR PT, OT, SLP-D/C home today 2.  DVT Prophylaxis/Anticoagulation: SCDs.  Monitor for any signs of DVT, 3. Pain Management: Tylenol as needed, sore L quad , cont sportscream 4. Mood: Provide emotional support.  Anxiety lorazepam 1 mg daily as needed 5. Neuropsych: This patient is  capable of making decisions on her own behalf.Cognitive deficits due to CVA 6. Skin/Wound Care: Routine skin checks 7. Fluids/Electrolytes/Nutrition: Routine in and outs with follow-up chemistries ordered, CMET ok except alb a little low, prostat 8.  Seizure disorder.  Continue Trileptal 300 mg twice daily 9.  CAD with stenting/PSVT.  No chest pain.  Continue aspirin and Plavix 10.  Type 2 diabetes mellitus.  Hemoglobin A1c 4.6.  Blood sugar checks discontinued- diet controlled 11.  Hyperlipidemia.  Lipitor  12.  CBC normal except mild thrombocytopenia 13.  Multiple falls with pelvic and L femur fracture start Calcium citrate +Vit D 14.  Left spastic foot drop- Right MCA infarct 2015 with AFO- may benefit from botox as OP vs inpt, PRAFO LOS: 6 days A FACE TO FACE EVALUATION WAS PERFORMED  Luanna Salk Lorik Guo 07/15/2018, 8:16 AM

## 2018-07-15 NOTE — Progress Notes (Signed)
Discharge instructions reviewed with pt and family by PA, Linna Hoff.  Copy of instructions and scripts given to pt by PA.  Pt d/c'd via wheelchair with belongings, with family.          Escorted by unit staff.

## 2018-07-15 NOTE — Progress Notes (Signed)
Social Work Discharge Note  The overall goal for the admission was met for:   Discharge location: Yes - home with family to provide 24/7 supervision  Length of Stay: Yes - 6 days  Discharge activity level: Yes - supervision; mod I w/c level for some tasks  Home/community participation: Yes  Services provided included: MD, RD, PT, OT, RN, Pharmacy and [place]: Private Insurance: [employer]  Follow-up services arranged: Home Health: PT/OT and Patient/Family request agency HH: [place], DME: Pt has all recommended DME at home already.  Comments (or additional information): Pt has good family support from her children and from one of her granddtrs who lives with her.    Patient/Family verbalized understanding of follow-up arrangements: Yes  Individual responsible for coordination of the follow-up plan: pt and her family  Confirmed correct DME delivered: Trey Sailors 07/15/2018    Kess Mcilwain, Silvestre Mesi

## 2018-07-15 NOTE — Patient Care Conference (Signed)
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 Tobacco use disorder 07/09/2018  . Small vessel disease ([place]) 07/09/2018  . Benign essential HTN   . Seizures ([place])   . Tachypnea   . Dysphagia, post-stroke   . Dysarthria 05/19/2018  . Syncope and collapse   . Syncope 08/24/2017  . Closed left femoral fracture ([place]) 04/21/2017  . Seizure disorder ([place]) 04/21/2017  . Closed pertrochanteric fracture of femur, left, initial encounter ([place]) 03/02/2017  . Seizure ([place]) 02/28/2017  . Hip fracture ([place]) 02/28/2017  . Chest pain 12/07/2016  . Atypical chest pain 12/07/2016  . Tachycardia   . NSTEMI (non-ST elevated myocardial infarction) ([place]) 11/02/2016  . Multifocal atrial tachycardia ([place]) 09/25/2016  . Elevated troponin 09/24/2016  . Hypokalemia 09/23/2016  . TIA (transient ischemic attack) 05/05/2016  . Intractable nausea and vomiting 10/15/2015  . Acute coronary syndrome ([place]) 06/01/2015  . Bronchitis 06/01/2015  . Thrombocytopenia ([place]) 06/01/2015  . Cerebral infarction ([place]) right occipital due to small vessel disease s/p tPA 03/06/2014  . Lower urinary tract infectious disease   . PSVT (paroxysmal supraventricular tachycardia) ([place]) 12/09/2013  . Esophageal varices ([place]) 03/29/2013  . Hematemesis 03/29/2013  . Hepatic cirrhosis ([place]) 03/29/2013  . Presence of stent in right coronary artery 07/04/2011  . OSA  (obstructive sleep apnea) 07/04/2011  . Aortic sclerosis 07/04/2011  . Aortic insufficiency 07/04/2011  . HTN (hypertension) 07/04/2011  . Carotid stenosis, bilateral 07/04/2011  . Chest pain at rest 07/03/2011  . CAD (coronary artery disease) 07/03/2011  . Hypercholesteremia 07/03/2011  . GERD (gastroesophageal reflux disease) 07/03/2011    Expected Discharge Date: Expected Discharge Date: 07/15/18  Team Members Present: Physician leading conference: Dr. Alysia Penna Social Worker Present: Alfonse Alpers, LCSW Nurse Present: Dorien Chihuahua, RN PT Present: Michaelene Song, PT OT Present: Willeen Cass, OT SLP Present: Weston Anna, SLP PPS Coordinator present : Daiva Nakayama, RN, CRRN     Current Status/Progress Goal Weekly Team Focus  Medical   Left lower extremity soreness and quad post exercise, chronic left hemiparesis mainly affecting lower limb prior stroke  Maintain medical stability, prevent recurrent stroke  Improved balance   Bowel/Bladder   continent of bowel and bladder, frequency, LBM 07-12-18  continent of bowel and bladder maintain regular bowel pattern  Assist with toileting needs prn   Swallow/Nutrition/ Hydration             ADL's   CGA with shower transfers, Close S with toilet transfers. S with toileting and dressing and standing balance  supervision bathing and shower transfers; mod I toileting, dressing, toilet transfers, standing balance with RW  ADL retraining, balance training, pt education   Mobility   supervision overall, short distance gait w/ RW, CGA-min assist stairs   supervision household gait, modified independent transfers  endurance and LE strengthening, discharge planning, all functional mobility    Communication  Safety/Cognition/ Behavioral Observations            Pain   left thigh pain managed muscle rub  no c/o pain   Assess pain q shift and prn   Skin   no skin issues   no skin issues  Assess skin q shift and prn     Rehab Goals Patient on target to meet rehab goals: Yes Rehab Goals Revised: none *See Care Plan and progress notes for long and short-term goals.     Barriers to Discharge  Current Status/Progress Possible Resolutions Date Resolved   Physician    Medical stability     Progressing towards goals  Plan discharge in the next 1 to 2 days      Nursing                  PT                    OT                  SLP                SW                Discharge Planning/Teaching Needs:  Pt to return to her home with her family who will provide 24/7 supervision.  Pt is independent to direct her care and son has been through family education.   Team Discussion:  Pt is stable medically and is continent of bowel and bladder and has no pain issues.  Pt is supervision level and if she has 24/7 supervision at home, therapy team feels fine with d/c.  Pt is mod I w/c level for some goals.  Pt is supervision level for gait and has some mod I/S goals for ADLs.  Revisions to Treatment Plan:  none    Continued Need for Acute Rehabilitation Level of Care: The patient requires daily medical management by a physician with specialized training in physical medicine and rehabilitation for the following conditions: Daily direction of a multidisciplinary physical rehabilitation program to ensure safe treatment while eliciting the highest outcome that is of practical value to the patient.: Yes Daily medical management of patient stability for increased activity during participation in an intensive rehabilitation regime.: Yes Daily analysis of laboratory values and/or radiology reports with any subsequent need for medication adjustment of medical intervention for : Neurological problems   I attest that I was present, lead the team conference, and concur with the assessment and plan of the team.   Jetta Murray, Silvestre Mesi 07/15/2018, 2:57 PM

## 2018-07-19 ENCOUNTER — Telehealth (INDEPENDENT_AMBULATORY_CARE_PROVIDER_SITE_OTHER): Payer: Self-pay | Admitting: Internal Medicine

## 2018-07-19 ENCOUNTER — Other Ambulatory Visit: Payer: Self-pay

## 2018-07-19 ENCOUNTER — Telehealth: Payer: Self-pay | Admitting: Registered Nurse

## 2018-07-19 DIAGNOSIS — I2510 Atherosclerotic heart disease of native coronary artery without angina pectoris: Secondary | ICD-10-CM | POA: Diagnosis not present

## 2018-07-19 DIAGNOSIS — E119 Type 2 diabetes mellitus without complications: Secondary | ICD-10-CM | POA: Diagnosis not present

## 2018-07-19 DIAGNOSIS — Z87440 Personal history of urinary (tract) infections: Secondary | ICD-10-CM | POA: Diagnosis not present

## 2018-07-19 DIAGNOSIS — R4781 Slurred speech: Secondary | ICD-10-CM | POA: Diagnosis not present

## 2018-07-19 DIAGNOSIS — R110 Nausea: Secondary | ICD-10-CM

## 2018-07-19 DIAGNOSIS — K760 Fatty (change of) liver, not elsewhere classified: Secondary | ICD-10-CM | POA: Diagnosis not present

## 2018-07-19 DIAGNOSIS — Z9181 History of falling: Secondary | ICD-10-CM | POA: Diagnosis not present

## 2018-07-19 DIAGNOSIS — G4730 Sleep apnea, unspecified: Secondary | ICD-10-CM | POA: Diagnosis not present

## 2018-07-19 DIAGNOSIS — M21371 Foot drop, right foot: Secondary | ICD-10-CM | POA: Diagnosis not present

## 2018-07-19 DIAGNOSIS — Z86718 Personal history of other venous thrombosis and embolism: Secondary | ICD-10-CM | POA: Diagnosis not present

## 2018-07-19 DIAGNOSIS — Z7982 Long term (current) use of aspirin: Secondary | ICD-10-CM | POA: Diagnosis not present

## 2018-07-19 DIAGNOSIS — I69354 Hemiplegia and hemiparesis following cerebral infarction affecting left non-dominant side: Secondary | ICD-10-CM | POA: Diagnosis not present

## 2018-07-19 DIAGNOSIS — I351 Nonrheumatic aortic (valve) insufficiency: Secondary | ICD-10-CM | POA: Diagnosis not present

## 2018-07-19 DIAGNOSIS — E785 Hyperlipidemia, unspecified: Secondary | ICD-10-CM | POA: Diagnosis not present

## 2018-07-19 DIAGNOSIS — Z7902 Long term (current) use of antithrombotics/antiplatelets: Secondary | ICD-10-CM | POA: Diagnosis not present

## 2018-07-19 DIAGNOSIS — I69992 Facial weakness following unspecified cerebrovascular disease: Secondary | ICD-10-CM | POA: Diagnosis not present

## 2018-07-19 DIAGNOSIS — I471 Supraventricular tachycardia: Secondary | ICD-10-CM | POA: Diagnosis not present

## 2018-07-19 DIAGNOSIS — I10 Essential (primary) hypertension: Secondary | ICD-10-CM | POA: Diagnosis not present

## 2018-07-19 MED ORDER — ONDANSETRON HCL 4 MG PO TABS: 4.0000 mg | ORAL_TABLET | Freq: Three times a day (TID) | ORAL | 1 refills | Status: DC | PRN

## 2018-07-19 NOTE — Telephone Encounter (Signed)
Transitional Care call-I spoke with [name]    1. Are you/is patient experiencing any problems since coming home? Are there any questions regarding any aspect of care?NO 2. Are there any questions regarding medications administration/dosing? Are meds being taken as prescribed? Patient should review meds with caller to confirm Yes she has all medications, no questions 3. Have there been any falls? NO 4. Has Home Health been to the house and/or have they contacted you? If not, have you tried to contact them? Can we help you contact them? They have not contacted her yet. She should expect a call today or she can call our office back. 5. Are bowels and bladder emptying properly? Are there any unexpected incontinence issues? If applicable, is patient following bowel/bladder programs?NO 6. Any fevers, problems with breathing, unexpected pain? NO 7. Are there any skin problems or new areas of breakdown? NO 8. Has the patient/family member arranged specialty MD follow up (ie cardiology/neurology/renal/surgical/etc)?  Can we help arrange? Appt given to see Danella Sensing NP for Dr Letta Pate 9. Does the patient need any other services or support that we can help arrange? NO 10. Are caregivers following through as expected in assisting the patient? YES Has the patient quit smoking, drinking alcohol, or using drugs as recommended? N/A   Appointment Thursday 07/29/18 @ 10 am arrive by 9:30 Alerted to watch mail for packet from our office [place] [address]

## 2018-07-19 NOTE — Patient Outreach (Signed)
[facility]) Care Management  07/19/2018  Aanya Haynes 1941-06-23 [id number]   Medication Adherence call to Mrs. [facility] spoke with patient she already pick up both medication on Atorvastatin 80 mg and Lisinopril 20 mg.Mrs. Dunstan is showing past due under [place].   [place] Management Direct Dial [phone]  Fax [phone] [email or web address]

## 2018-07-19 NOTE — Telephone Encounter (Signed)
Transitional care call made, no answer. Left message to return the call.  1st attempt.

## 2018-07-19 NOTE — Telephone Encounter (Signed)
Rx for Zofran sent to her pharmacy

## 2018-07-20 ENCOUNTER — Telehealth: Payer: Self-pay

## 2018-07-20 NOTE — Telephone Encounter (Signed)
[name], PT/ADVHC requesting HHPT 2wk3. Orders given and approved per discharge summary.

## 2018-07-20 NOTE — Telephone Encounter (Signed)
Rx for Zofran sent to her phamacy

## 2018-07-21 DIAGNOSIS — I2510 Atherosclerotic heart disease of native coronary artery without angina pectoris: Secondary | ICD-10-CM | POA: Diagnosis not present

## 2018-07-21 DIAGNOSIS — I471 Supraventricular tachycardia: Secondary | ICD-10-CM | POA: Diagnosis not present

## 2018-07-21 DIAGNOSIS — Z7902 Long term (current) use of antithrombotics/antiplatelets: Secondary | ICD-10-CM | POA: Diagnosis not present

## 2018-07-21 DIAGNOSIS — E785 Hyperlipidemia, unspecified: Secondary | ICD-10-CM | POA: Diagnosis not present

## 2018-07-21 DIAGNOSIS — I69992 Facial weakness following unspecified cerebrovascular disease: Secondary | ICD-10-CM | POA: Diagnosis not present

## 2018-07-21 DIAGNOSIS — Z7982 Long term (current) use of aspirin: Secondary | ICD-10-CM | POA: Diagnosis not present

## 2018-07-21 DIAGNOSIS — K219 Gastro-esophageal reflux disease without esophagitis: Secondary | ICD-10-CM | POA: Diagnosis not present

## 2018-07-21 DIAGNOSIS — Z6824 Body mass index (BMI) 24.0-24.9, adult: Secondary | ICD-10-CM | POA: Diagnosis not present

## 2018-07-21 DIAGNOSIS — K760 Fatty (change of) liver, not elsewhere classified: Secondary | ICD-10-CM | POA: Diagnosis not present

## 2018-07-21 DIAGNOSIS — E119 Type 2 diabetes mellitus without complications: Secondary | ICD-10-CM | POA: Diagnosis not present

## 2018-07-21 DIAGNOSIS — G4730 Sleep apnea, unspecified: Secondary | ICD-10-CM | POA: Diagnosis not present

## 2018-07-21 DIAGNOSIS — Z87440 Personal history of urinary (tract) infections: Secondary | ICD-10-CM | POA: Diagnosis not present

## 2018-07-21 DIAGNOSIS — I69354 Hemiplegia and hemiparesis following cerebral infarction affecting left non-dominant side: Secondary | ICD-10-CM | POA: Diagnosis not present

## 2018-07-21 DIAGNOSIS — I639 Cerebral infarction, unspecified: Secondary | ICD-10-CM | POA: Diagnosis not present

## 2018-07-21 DIAGNOSIS — Z86718 Personal history of other venous thrombosis and embolism: Secondary | ICD-10-CM | POA: Diagnosis not present

## 2018-07-21 DIAGNOSIS — R4781 Slurred speech: Secondary | ICD-10-CM | POA: Diagnosis not present

## 2018-07-21 DIAGNOSIS — Z9181 History of falling: Secondary | ICD-10-CM | POA: Diagnosis not present

## 2018-07-21 DIAGNOSIS — M21371 Foot drop, right foot: Secondary | ICD-10-CM | POA: Diagnosis not present

## 2018-07-21 DIAGNOSIS — I10 Essential (primary) hypertension: Secondary | ICD-10-CM | POA: Diagnosis not present

## 2018-07-21 DIAGNOSIS — I351 Nonrheumatic aortic (valve) insufficiency: Secondary | ICD-10-CM | POA: Diagnosis not present

## 2018-07-26 DIAGNOSIS — B351 Tinea unguium: Secondary | ICD-10-CM | POA: Diagnosis not present

## 2018-07-26 DIAGNOSIS — E1142 Type 2 diabetes mellitus with diabetic polyneuropathy: Secondary | ICD-10-CM | POA: Diagnosis not present

## 2018-07-26 DIAGNOSIS — L851 Acquired keratosis [keratoderma] palmaris et plantaris: Secondary | ICD-10-CM | POA: Diagnosis not present

## 2018-07-27 DIAGNOSIS — I69354 Hemiplegia and hemiparesis following cerebral infarction affecting left non-dominant side: Secondary | ICD-10-CM | POA: Diagnosis not present

## 2018-07-27 DIAGNOSIS — I69992 Facial weakness following unspecified cerebrovascular disease: Secondary | ICD-10-CM | POA: Diagnosis not present

## 2018-07-27 DIAGNOSIS — Z7902 Long term (current) use of antithrombotics/antiplatelets: Secondary | ICD-10-CM | POA: Diagnosis not present

## 2018-07-27 DIAGNOSIS — Z87440 Personal history of urinary (tract) infections: Secondary | ICD-10-CM | POA: Diagnosis not present

## 2018-07-27 DIAGNOSIS — E119 Type 2 diabetes mellitus without complications: Secondary | ICD-10-CM | POA: Diagnosis not present

## 2018-07-27 DIAGNOSIS — Z7982 Long term (current) use of aspirin: Secondary | ICD-10-CM | POA: Diagnosis not present

## 2018-07-27 DIAGNOSIS — K760 Fatty (change of) liver, not elsewhere classified: Secondary | ICD-10-CM | POA: Diagnosis not present

## 2018-07-27 DIAGNOSIS — R4781 Slurred speech: Secondary | ICD-10-CM | POA: Diagnosis not present

## 2018-07-27 DIAGNOSIS — E785 Hyperlipidemia, unspecified: Secondary | ICD-10-CM | POA: Diagnosis not present

## 2018-07-27 DIAGNOSIS — I10 Essential (primary) hypertension: Secondary | ICD-10-CM | POA: Diagnosis not present

## 2018-07-27 DIAGNOSIS — I2510 Atherosclerotic heart disease of native coronary artery without angina pectoris: Secondary | ICD-10-CM | POA: Diagnosis not present

## 2018-07-27 DIAGNOSIS — Z9181 History of falling: Secondary | ICD-10-CM | POA: Diagnosis not present

## 2018-07-27 DIAGNOSIS — Z86718 Personal history of other venous thrombosis and embolism: Secondary | ICD-10-CM | POA: Diagnosis not present

## 2018-07-27 DIAGNOSIS — G4730 Sleep apnea, unspecified: Secondary | ICD-10-CM | POA: Diagnosis not present

## 2018-07-27 DIAGNOSIS — I351 Nonrheumatic aortic (valve) insufficiency: Secondary | ICD-10-CM | POA: Diagnosis not present

## 2018-07-27 DIAGNOSIS — M21371 Foot drop, right foot: Secondary | ICD-10-CM | POA: Diagnosis not present

## 2018-07-27 DIAGNOSIS — I471 Supraventricular tachycardia: Secondary | ICD-10-CM | POA: Diagnosis not present

## 2018-07-29 ENCOUNTER — Telehealth: Payer: Self-pay

## 2018-07-29 ENCOUNTER — Encounter: Payer: Self-pay | Admitting: Registered Nurse

## 2018-07-29 ENCOUNTER — Other Ambulatory Visit: Payer: Self-pay

## 2018-07-29 ENCOUNTER — Encounter: Payer: Medicare Other | Attending: Registered Nurse | Admitting: Registered Nurse

## 2018-07-29 VITALS — BP 197/65 | HR 69 | Ht 63.0 in | Wt 128.0 lb

## 2018-07-29 DIAGNOSIS — M79605 Pain in left leg: Secondary | ICD-10-CM | POA: Insufficient documentation

## 2018-07-29 DIAGNOSIS — Z8249 Family history of ischemic heart disease and other diseases of the circulatory system: Secondary | ICD-10-CM | POA: Diagnosis not present

## 2018-07-29 DIAGNOSIS — E7849 Other hyperlipidemia: Secondary | ICD-10-CM | POA: Insufficient documentation

## 2018-07-29 DIAGNOSIS — Z86718 Personal history of other venous thrombosis and embolism: Secondary | ICD-10-CM

## 2018-07-29 DIAGNOSIS — E119 Type 2 diabetes mellitus without complications: Secondary | ICD-10-CM | POA: Diagnosis not present

## 2018-07-29 DIAGNOSIS — K589 Irritable bowel syndrome without diarrhea: Secondary | ICD-10-CM | POA: Diagnosis not present

## 2018-07-29 DIAGNOSIS — K7460 Unspecified cirrhosis of liver: Secondary | ICD-10-CM | POA: Diagnosis not present

## 2018-07-29 DIAGNOSIS — Z7902 Long term (current) use of antithrombotics/antiplatelets: Secondary | ICD-10-CM

## 2018-07-29 DIAGNOSIS — R569 Unspecified convulsions: Secondary | ICD-10-CM | POA: Insufficient documentation

## 2018-07-29 DIAGNOSIS — I10 Essential (primary) hypertension: Secondary | ICD-10-CM | POA: Diagnosis not present

## 2018-07-29 DIAGNOSIS — G40909 Epilepsy, unspecified, not intractable, without status epilepticus: Secondary | ICD-10-CM

## 2018-07-29 DIAGNOSIS — K760 Fatty (change of) liver, not elsewhere classified: Secondary | ICD-10-CM

## 2018-07-29 DIAGNOSIS — I2510 Atherosclerotic heart disease of native coronary artery without angina pectoris: Secondary | ICD-10-CM | POA: Diagnosis not present

## 2018-07-29 DIAGNOSIS — Z7982 Long term (current) use of aspirin: Secondary | ICD-10-CM

## 2018-07-29 DIAGNOSIS — E7800 Pure hypercholesterolemia, unspecified: Secondary | ICD-10-CM | POA: Diagnosis not present

## 2018-07-29 DIAGNOSIS — Z955 Presence of coronary angioplasty implant and graft: Secondary | ICD-10-CM | POA: Diagnosis not present

## 2018-07-29 DIAGNOSIS — Z8673 Personal history of transient ischemic attack (TIA), and cerebral infarction without residual deficits: Secondary | ICD-10-CM | POA: Insufficient documentation

## 2018-07-29 DIAGNOSIS — Z79899 Other long term (current) drug therapy: Secondary | ICD-10-CM | POA: Diagnosis not present

## 2018-07-29 DIAGNOSIS — K219 Gastro-esophageal reflux disease without esophagitis: Secondary | ICD-10-CM | POA: Diagnosis not present

## 2018-07-29 DIAGNOSIS — M25552 Pain in left hip: Secondary | ICD-10-CM | POA: Diagnosis not present

## 2018-07-29 DIAGNOSIS — Z9181 History of falling: Secondary | ICD-10-CM

## 2018-07-29 DIAGNOSIS — I639 Cerebral infarction, unspecified: Secondary | ICD-10-CM | POA: Diagnosis not present

## 2018-07-29 DIAGNOSIS — I6523 Occlusion and stenosis of bilateral carotid arteries: Secondary | ICD-10-CM | POA: Insufficient documentation

## 2018-07-29 DIAGNOSIS — Z87440 Personal history of urinary (tract) infections: Secondary | ICD-10-CM

## 2018-07-29 DIAGNOSIS — I471 Supraventricular tachycardia: Secondary | ICD-10-CM

## 2018-07-29 DIAGNOSIS — G4733 Obstructive sleep apnea (adult) (pediatric): Secondary | ICD-10-CM | POA: Diagnosis not present

## 2018-07-29 DIAGNOSIS — G4730 Sleep apnea, unspecified: Secondary | ICD-10-CM

## 2018-07-29 DIAGNOSIS — F17210 Nicotine dependence, cigarettes, uncomplicated: Secondary | ICD-10-CM | POA: Diagnosis not present

## 2018-07-29 DIAGNOSIS — R4781 Slurred speech: Secondary | ICD-10-CM | POA: Diagnosis not present

## 2018-07-29 DIAGNOSIS — F329 Major depressive disorder, single episode, unspecified: Secondary | ICD-10-CM | POA: Diagnosis not present

## 2018-07-29 DIAGNOSIS — M21371 Foot drop, right foot: Secondary | ICD-10-CM

## 2018-07-29 DIAGNOSIS — I6381 Other cerebral infarction due to occlusion or stenosis of small artery: Secondary | ICD-10-CM

## 2018-07-29 DIAGNOSIS — I351 Nonrheumatic aortic (valve) insufficiency: Secondary | ICD-10-CM | POA: Diagnosis not present

## 2018-07-29 DIAGNOSIS — K449 Diaphragmatic hernia without obstruction or gangrene: Secondary | ICD-10-CM | POA: Diagnosis not present

## 2018-07-29 DIAGNOSIS — I69354 Hemiplegia and hemiparesis following cerebral infarction affecting left non-dominant side: Secondary | ICD-10-CM | POA: Diagnosis not present

## 2018-07-29 DIAGNOSIS — I69992 Facial weakness following unspecified cerebrovascular disease: Secondary | ICD-10-CM | POA: Diagnosis not present

## 2018-07-29 DIAGNOSIS — E785 Hyperlipidemia, unspecified: Secondary | ICD-10-CM

## 2018-07-29 NOTE — Progress Notes (Signed)
Subjective:    Patient ID: [name], female    DOB: 1941-08-21, 77 y.o.   MRN: [id number]  HPI: [name] is a 77 year old female who is here for Transitional Care visit in follow up of her Basal Ganglia Infarction,CAD  With stenting,, seizures, hyperlipidemia and uncontrolled hypertension. She has been home with home health therapies with [facility]. She states she has pain in her left hip and left lower extremity. She rates her pain 4. Her current exercise regime is walking with walker she reports.  [name] arrived to office with uncontrolled hypertension, she reports she didn't take her antihypertensive medication this morning. Educated on medication compliance. She refuses ED evaluation and states she will go home and take her medication and family will monitor and call with blood pressure reading. Educated on keeping a blood pressure journal, she and her grand daughter and daughter in law verbalizes understanding.   Pain Inventory Average Pain 4 Pain Right Now 4 My pain is dull  In the last 24 hours, has pain interfered with the following? General activity 0 Relation with others 0 Enjoyment of life 1 What TIME of day is your pain at its worst? night Sleep (in general) Fair  Pain is worse with: none Pain improves with: rest, heat/ice and medication Relief from Meds: 1  Mobility walk with assistance use a walker how many minutes can you walk? 5 ability to climb steps?  no do you drive?  no use a wheelchair needs help with transfers  Function retired I need assistance with the following:  bathing, meal prep, household duties and shopping  Neuro/Psych bowel control problems trouble walking dizziness confusion depression  Prior Studies x-rays CT/MRI  Physicians involved in your care Primary care [place]   Family History  Problem Relation Age of Onset  . Diabetes Mother   . Heart failure Father   . Heart failure Maternal Aunt    Social  History   Socioeconomic History  . Marital status: Widowed    Spouse name: Not on file  . Number of children: 2  . Years of education: Not on file  . Highest education level: Not on file  Occupational History    Employer: RETIRED  Social Needs  . Financial resource strain: Not on file  . Food insecurity:    Worry: Not on file    Inability: Not on file  . Transportation needs:    Medical: Not on file    Non-medical: Not on file  Tobacco Use  . Smoking status: Current Some Day Smoker    Packs/day: 0.25    Years: 50.00    Pack years: 12.50    Types: Cigarettes  . Smokeless tobacco: Never Used  . Tobacco comment: 1 pk lasts a week.   Substance and Sexual Activity  . Alcohol use: No    Alcohol/week: 0.0 standard drinks  . Drug use: No  . Sexual activity: Not on file  Lifestyle  . Physical activity:    Days per week: Not on file    Minutes per session: Not on file  . Stress: Not on file  Relationships  . Social connections:    Talks on phone: Not on file    Gets together: Not on file    Attends religious service: Not on file    Active member of club or organization: Not on file    Attends meetings of clubs or organizations: Not on file    Relationship status: Not  on file  Other Topics Concern  . Not on file  Social History Narrative  . Not on file   Past Surgical History:  Procedure Laterality Date  . [place]  . APPENDECTOMY    . BACK SURGERY  1985  . CARDIAC CATHETERIZATION  [phone]   Stent to the proximal RCA after MI   . CARDIAC CATHETERIZATION N/A 09/26/2016   Procedure: Left Heart Cath and Coronary Angiography;  Surgeon: Leonie Man, MD;  Location: [facility];  Service: Cardiovascular;  Laterality: N/A;  . CATARACT EXTRACTION W/PHACO Right 07/04/2014   Procedure: CATARACT EXTRACTION PHACO AND INTRAOCULAR LENS PLACEMENT (IOC);  Surgeon: Elta Guadeloupe T. Gershon Crane, MD;  Location: [facility];  Service: Ophthalmology;  Laterality: Right;   CDE:13.13  . COLONOSCOPY    . DILATION AND CURETTAGE OF UTERUS     x2  . Epi Retinal Membrane Peel Left   . ERCP    . ESOPHAGEAL BANDING N/A 04/01/2013   Procedure: ESOPHAGEAL BANDING;  Surgeon: Rogene Houston, MD;  Location: [facility];  Service: Endoscopy;  Laterality: N/A;  . ESOPHAGEAL BANDING N/A 05/24/2013   Procedure: ESOPHAGEAL BANDING;  Surgeon: Rogene Houston, MD;  [facility];  Service: Endoscopy;  Laterality: N/A;  . ESOPHAGEAL BANDING N/A 06/21/2014   Procedure: ESOPHAGEAL BANDING;  Surgeon: Rogene Houston, MD;  [facility];  Service: Endoscopy;  Laterality: N/A;  . ESOPHAGOGASTRODUODENOSCOPY N/A 04/01/2013   Procedure: ESOPHAGOGASTRODUODENOSCOPY (EGD);  Surgeon: Rogene Houston, MD;  [facility];  Service: Endoscopy;  Laterality: N/A;  230-rescheduled to 8:30am Ann notified pt  . ESOPHAGOGASTRODUODENOSCOPY N/A 05/24/2013   Procedure: ESOPHAGOGASTRODUODENOSCOPY (EGD);  Surgeon: Rogene Houston, MD;  [facility];  Service: Endoscopy;  Laterality: N/A;  730  . ESOPHAGOGASTRODUODENOSCOPY N/A 06/21/2014   Procedure: ESOPHAGOGASTRODUODENOSCOPY (EGD);  Surgeon: Rogene Houston, MD;  [facility];  Service: Endoscopy;  Laterality: N/A;  930-rescheduled 10/14 @ 1200 Ann to notify pt  . EYE SURGERY  08   cataract surgery of the left eye  . FEMUR IM NAIL Left 03/02/2017   Procedure: INTRAMEDULLARY (IM) NAIL FEMORAL;  Surgeon: Rod Can, MD;  Location: [facility];  Service: Orthopedics;  Laterality: Left;  . HARDWARE REMOVAL Right 01/17/2013   Procedure: REMOVAL OF HARDWARE AND EXCISION ULNAR STYLOID RIGHT WRIST;  Surgeon: Tennis Must, MD;  Location: [facility];  Service: Orthopedics;  Laterality: Right;  . MYRINGOTOMY  2012   both ears  . ORIF FEMUR FRACTURE Left 04/23/2017   Procedure: OPEN REDUCTION INTERNAL FIXATION (ORIF) DISTAL FEMUR FRACTURE (FRACTURE AROUND FEMORAL NAIL);  Surgeon: Altamese Lamar, MD;   [facility];  Service: Orthopedics;  Laterality: Left;  . TONSILLECTOMY    . VAGINAL HYSTERECTOMY  1972  . WRIST SURGERY     rt wrist hardwear removal   Past Medical History:  Diagnosis Date  . Anemia   . Aortic insufficiency    Moderate  . Back pain   . Carotid stenosis, bilateral   . Cirrhosis ([place])   . Closed left femoral fracture ([place]) 04/21/2017  . Coronary artery disease    Stent x 2 RCA 1995, cardiac catheterization 09/2016 showing only mild atherosclerosis  . DDD (degenerative disc disease), lumbar   . Essential hypertension   . Falls   . GERD (gastroesophageal reflux disease)   . Hiatal hernia   . History of stroke   . Hypercholesteremia   .  IBS (irritable bowel syndrome)   . Non-alcoholic fatty liver disease   . OSA (obstructive sleep apnea)    no CPAP  . Pericardial effusion    a. small by echo 2018.  Mar[place] Kitchen PSVT (paroxysmal supraventricular tachycardia) ([place])   . Recurrent UTI   . Seizures ([place])   . Stroke ([place])   . Type 2 diabetes mellitus ([place])    off medication  . Varices, esophageal ([facility])    BP (!) 184/70   Pulse 66   Ht 5' 3"  (1.6 m)   Wt 128 lb (58.1 kg) Comment: pt reported, in wheelchair  SpO2 96%   BMI 22.67 kg/m   Opioid Risk Score:   Fall Risk Score:  `1  Depression screen PHQ 2/9  Depression screen [facility] 2/9 05/13/2018 12/28/2017 09/15/2017 07/06/2017 03/09/2017  Decreased Interest 0 0 0 0 0  Down, Depressed, Hopeless 1 0 1 0 0  PHQ - 2 Score 1 0 1 0 0  Some recent data might be hidden    Review of Systems  Constitutional: Positive for appetite change and unexpected weight change.  HENT: Negative.   Eyes: Negative.   Respiratory: Negative.   Cardiovascular: Positive for leg swelling.  Gastrointestinal: Positive for nausea and vomiting.  Endocrine: Negative.   Genitourinary: Negative.   Musculoskeletal: Negative.   Skin: Negative.   Allergic/Immunologic: Negative.   Neurological: Negative.   Hematological: Bruises/bleeds easily.    Psychiatric/Behavioral: Negative.   All other systems reviewed and are negative.      Objective:   Physical Exam  Constitutional: She is oriented to person, place, and time. She appears well-developed and well-nourished.  HENT:  Head: Normocephalic and atraumatic.  Neck: Normal range of motion. Neck supple.  Cardiovascular: Normal rate and regular rhythm.  Pulmonary/Chest: Effort normal and breath sounds normal.  Musculoskeletal:  Normal Muscle Bulk and Muscle Testing Reveals: Upper Extremities: Full ROM and Muscle Strength 5/5 Lower Extremites: Right: Full ROM and Muscle Strength 5/5 Left: Decreased ROM and Muscle Strength 4/4 Wearing Left AFO Brace Arrived in wheelchair   Neurological: She is oriented to person, place, and time.  Skin: Skin is warm and dry.  Psychiatric: She has a normal mood and affect. Her behavior is normal.  Nursing note and vitals reviewed.         Assessment & Plan:  1. Basal Ganglia Infarction: Continue with Home Health Therapies. Has a F/U appointment with Neurology.  2. CAD/ Carotid Stenosis: Continue current medication regimen and PCP Following.  3. Seizures: PCP Following Continue current medication regimen  4. Hyperlipidemia: Continue current medication regimen. PCP Following.  5. Uncontrolled HTN: Educated on medication compliance and keeping blood pressure journal. [name] and Family verbalizes understanding.   30 minutes of face to face patient care time was spent during this visit. All questions was encouraged and answered.   F/U in 4-6 weeks with Dr Letta Pate

## 2018-07-29 NOTE — Telephone Encounter (Signed)
[name] called for [name], stated was told to recheck blood pressures on home arrival today after appointment.  Stated her blood pressure is currently 132/68

## 2018-07-30 DIAGNOSIS — E119 Type 2 diabetes mellitus without complications: Secondary | ICD-10-CM | POA: Diagnosis not present

## 2018-07-30 DIAGNOSIS — M21371 Foot drop, right foot: Secondary | ICD-10-CM | POA: Diagnosis not present

## 2018-07-30 DIAGNOSIS — I2510 Atherosclerotic heart disease of native coronary artery without angina pectoris: Secondary | ICD-10-CM | POA: Diagnosis not present

## 2018-07-30 DIAGNOSIS — G4730 Sleep apnea, unspecified: Secondary | ICD-10-CM | POA: Diagnosis not present

## 2018-07-30 DIAGNOSIS — Z7982 Long term (current) use of aspirin: Secondary | ICD-10-CM | POA: Diagnosis not present

## 2018-07-30 DIAGNOSIS — Z7902 Long term (current) use of antithrombotics/antiplatelets: Secondary | ICD-10-CM | POA: Diagnosis not present

## 2018-07-30 DIAGNOSIS — I69354 Hemiplegia and hemiparesis following cerebral infarction affecting left non-dominant side: Secondary | ICD-10-CM | POA: Diagnosis not present

## 2018-07-30 DIAGNOSIS — I351 Nonrheumatic aortic (valve) insufficiency: Secondary | ICD-10-CM | POA: Diagnosis not present

## 2018-07-30 DIAGNOSIS — Z87440 Personal history of urinary (tract) infections: Secondary | ICD-10-CM | POA: Diagnosis not present

## 2018-07-30 DIAGNOSIS — Z9181 History of falling: Secondary | ICD-10-CM | POA: Diagnosis not present

## 2018-07-30 DIAGNOSIS — I471 Supraventricular tachycardia: Secondary | ICD-10-CM | POA: Diagnosis not present

## 2018-07-30 DIAGNOSIS — K760 Fatty (change of) liver, not elsewhere classified: Secondary | ICD-10-CM | POA: Diagnosis not present

## 2018-07-30 DIAGNOSIS — R4781 Slurred speech: Secondary | ICD-10-CM | POA: Diagnosis not present

## 2018-07-30 DIAGNOSIS — I10 Essential (primary) hypertension: Secondary | ICD-10-CM | POA: Diagnosis not present

## 2018-07-30 DIAGNOSIS — Z86718 Personal history of other venous thrombosis and embolism: Secondary | ICD-10-CM | POA: Diagnosis not present

## 2018-07-30 DIAGNOSIS — E785 Hyperlipidemia, unspecified: Secondary | ICD-10-CM | POA: Diagnosis not present

## 2018-07-30 DIAGNOSIS — I69992 Facial weakness following unspecified cerebrovascular disease: Secondary | ICD-10-CM | POA: Diagnosis not present

## 2018-08-03 ENCOUNTER — Other Ambulatory Visit: Payer: Self-pay

## 2018-08-03 DIAGNOSIS — Z86718 Personal history of other venous thrombosis and embolism: Secondary | ICD-10-CM | POA: Diagnosis not present

## 2018-08-03 DIAGNOSIS — I6523 Occlusion and stenosis of bilateral carotid arteries: Secondary | ICD-10-CM

## 2018-08-03 DIAGNOSIS — Z7902 Long term (current) use of antithrombotics/antiplatelets: Secondary | ICD-10-CM | POA: Diagnosis not present

## 2018-08-03 DIAGNOSIS — G4730 Sleep apnea, unspecified: Secondary | ICD-10-CM | POA: Diagnosis not present

## 2018-08-03 DIAGNOSIS — Z7982 Long term (current) use of aspirin: Secondary | ICD-10-CM | POA: Diagnosis not present

## 2018-08-03 DIAGNOSIS — I2510 Atherosclerotic heart disease of native coronary artery without angina pectoris: Secondary | ICD-10-CM | POA: Diagnosis not present

## 2018-08-03 DIAGNOSIS — R4781 Slurred speech: Secondary | ICD-10-CM | POA: Diagnosis not present

## 2018-08-03 DIAGNOSIS — M21371 Foot drop, right foot: Secondary | ICD-10-CM | POA: Diagnosis not present

## 2018-08-03 DIAGNOSIS — K760 Fatty (change of) liver, not elsewhere classified: Secondary | ICD-10-CM | POA: Diagnosis not present

## 2018-08-03 DIAGNOSIS — E119 Type 2 diabetes mellitus without complications: Secondary | ICD-10-CM | POA: Diagnosis not present

## 2018-08-03 DIAGNOSIS — Z87440 Personal history of urinary (tract) infections: Secondary | ICD-10-CM | POA: Diagnosis not present

## 2018-08-03 DIAGNOSIS — I69354 Hemiplegia and hemiparesis following cerebral infarction affecting left non-dominant side: Secondary | ICD-10-CM | POA: Diagnosis not present

## 2018-08-03 DIAGNOSIS — E785 Hyperlipidemia, unspecified: Secondary | ICD-10-CM | POA: Diagnosis not present

## 2018-08-03 DIAGNOSIS — I10 Essential (primary) hypertension: Secondary | ICD-10-CM | POA: Diagnosis not present

## 2018-08-03 DIAGNOSIS — I351 Nonrheumatic aortic (valve) insufficiency: Secondary | ICD-10-CM | POA: Diagnosis not present

## 2018-08-03 DIAGNOSIS — Z9181 History of falling: Secondary | ICD-10-CM | POA: Diagnosis not present

## 2018-08-03 DIAGNOSIS — I471 Supraventricular tachycardia: Secondary | ICD-10-CM | POA: Diagnosis not present

## 2018-08-03 DIAGNOSIS — I69992 Facial weakness following unspecified cerebrovascular disease: Secondary | ICD-10-CM | POA: Diagnosis not present

## 2018-08-04 IMAGING — CT CT ABD-PELV W/ CM
2 of 5 series · 16 of 46 positions shown, 18 images · IV contrast (iopamidol)
Comparison: CT pelvis dated September 10, 2017. CT abdomen pelvis
dated July 09, 2015.

CLINICAL DATA: Nausea and vomiting. No abdominal pain. Elevated
lipase.

EXAM:
CT ABDOMEN AND PELVIS WITH CONTRAST
TECHNIQUE: Multidetector CT imaging of the abdomen and pelvis was performed
using the standard protocol following bolus administration of
intravenous contrast.
CONTRAST:  100mL [id number] IOPAMIDOL ([id number]) INJECTION 61%

[Series 2: axial st · axial · 0.70mm/px · z∈[+1645,+1985]mm · 13 of 76 slices shown, 15 images]
[im 4/76  soft-tissue]
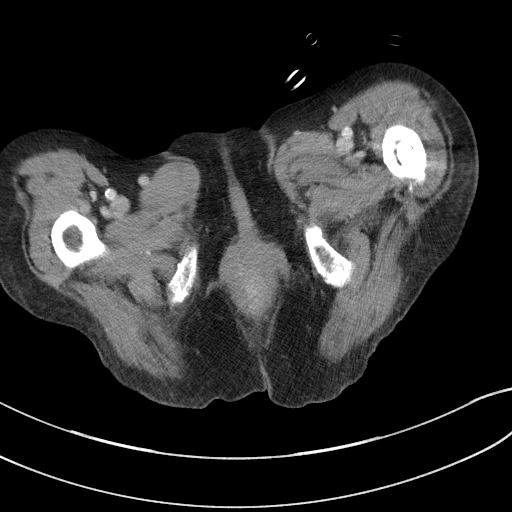
[im 4/76  bone]
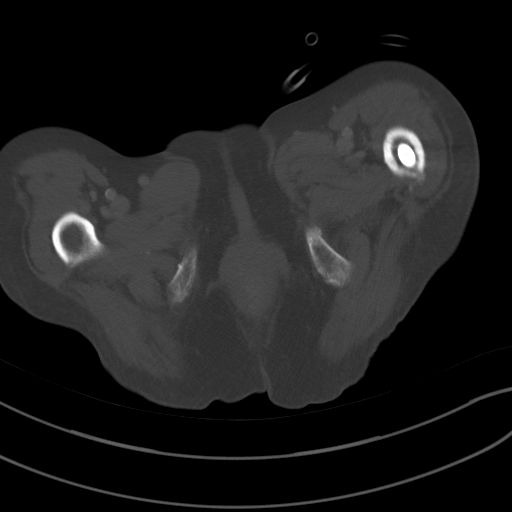
[im 12/76  soft-tissue]
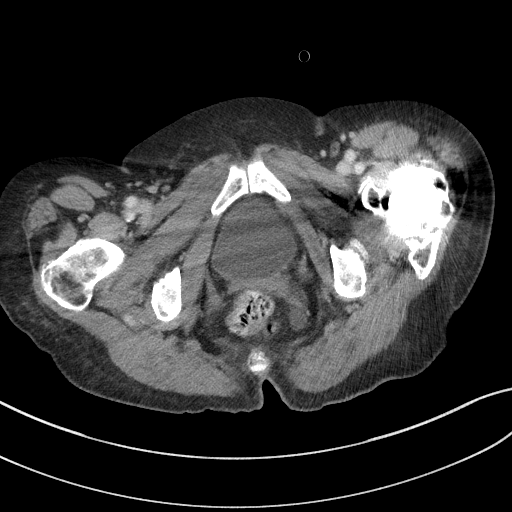
[im 16/76  soft-tissue]
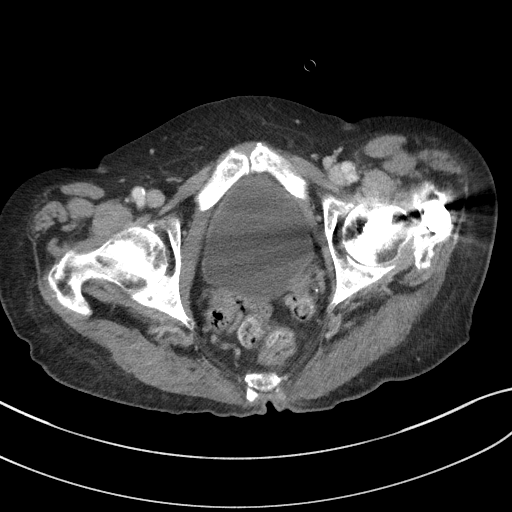
[im 23/76  soft-tissue]
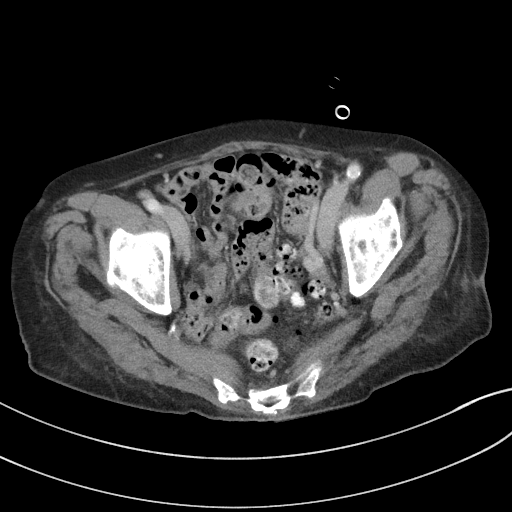
[im 27/76  soft-tissue]
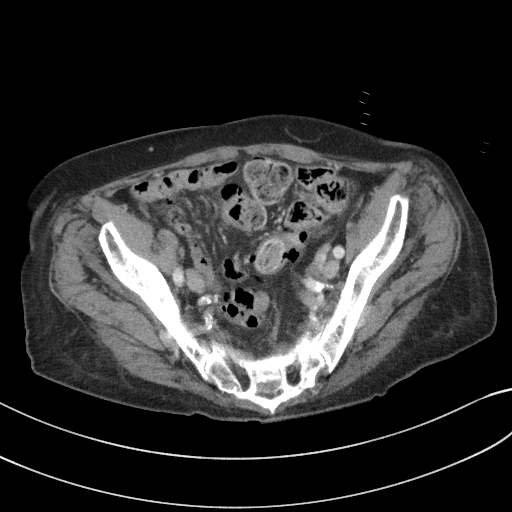
[im 34/76  soft-tissue]
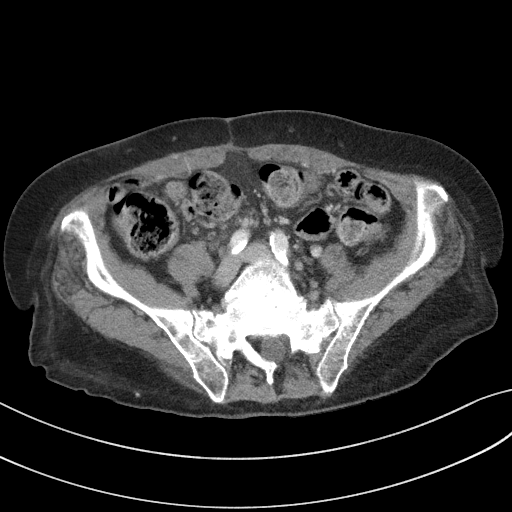
[im 38/76  soft-tissue]
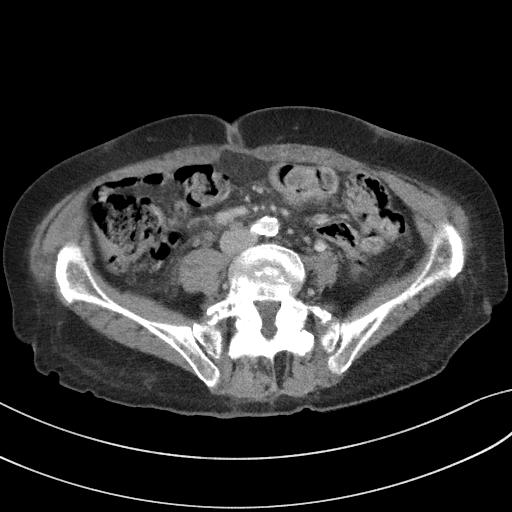
[im 42/76  soft-tissue]
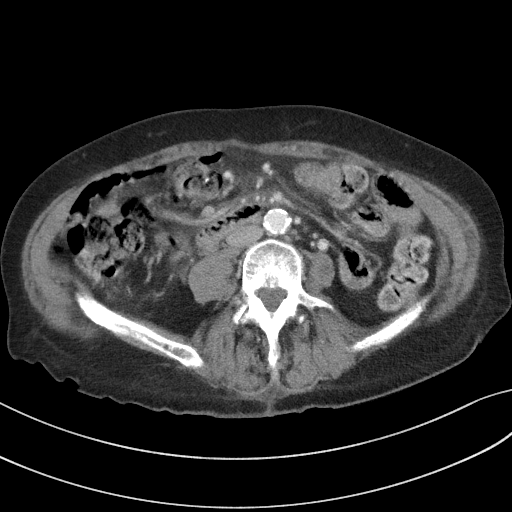
[im 49/76  soft-tissue]
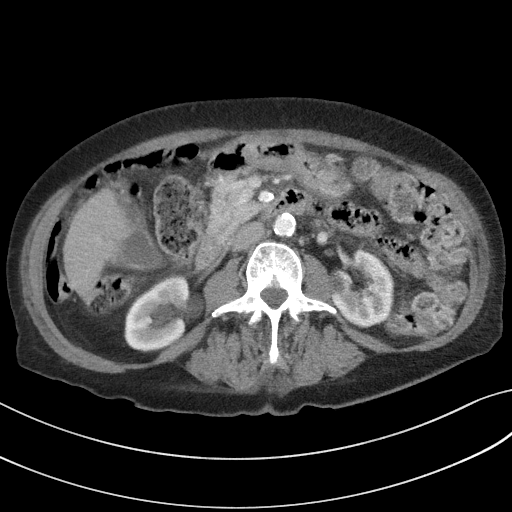
[im 49/76  bone]
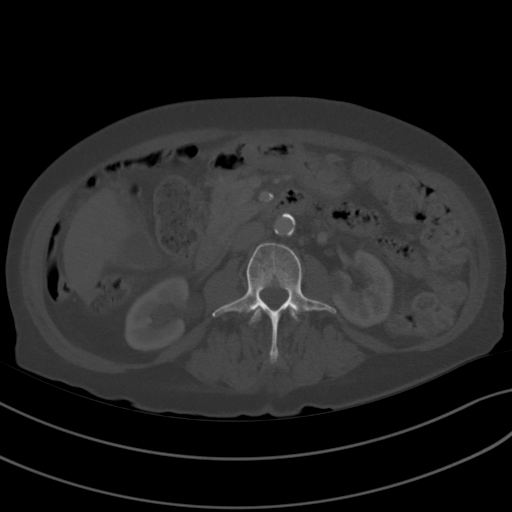
[im 53/76  soft-tissue]
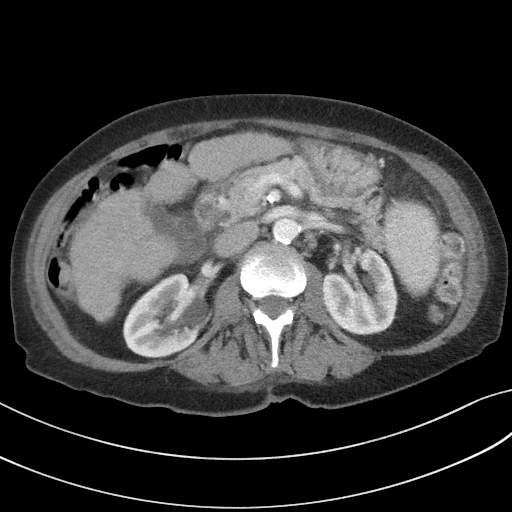
[im 61/76  soft-tissue]
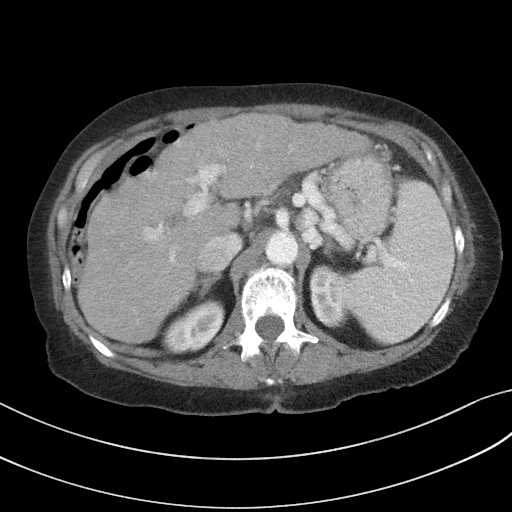
[im 64/76  soft-tissue]
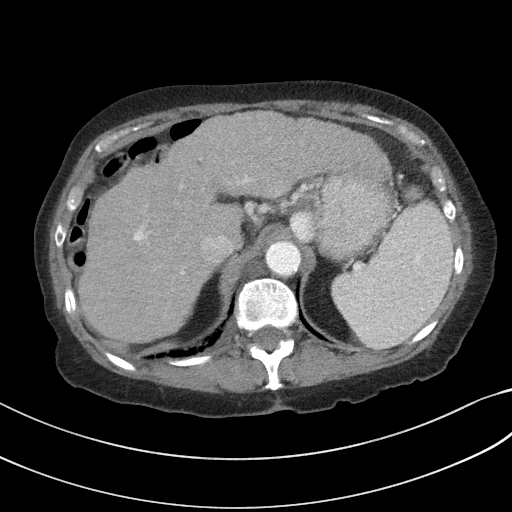
[im 72/76  soft-tissue]
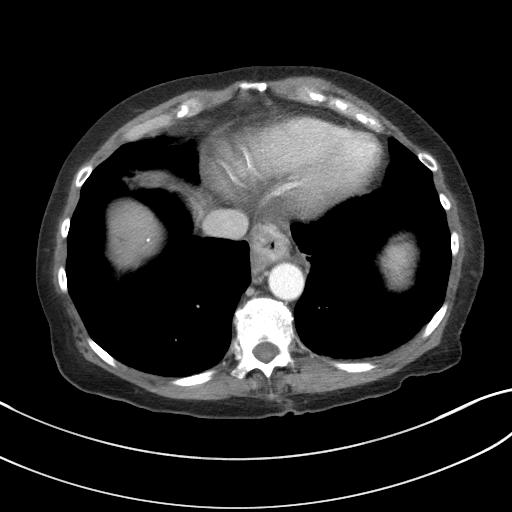

[Series 5: coronal st · coronal · 0.68mm/px · 3 of 90 slices shown]
[im 30/90  soft-tissue]
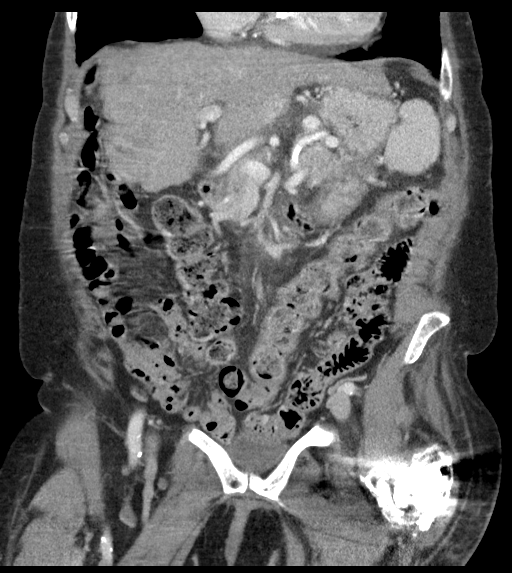
[im 40/90  soft-tissue]
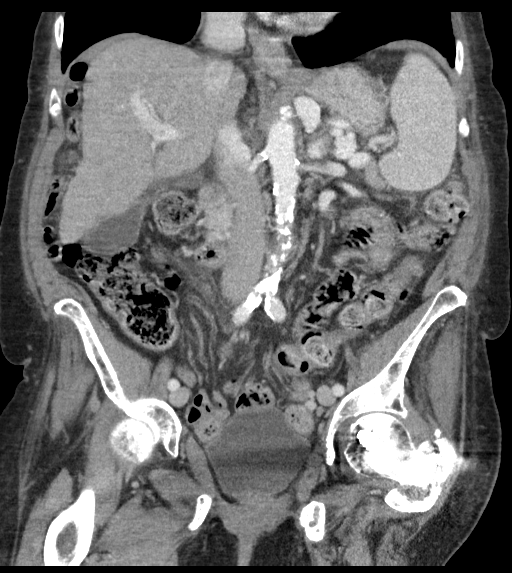
[im 50/90  soft-tissue]
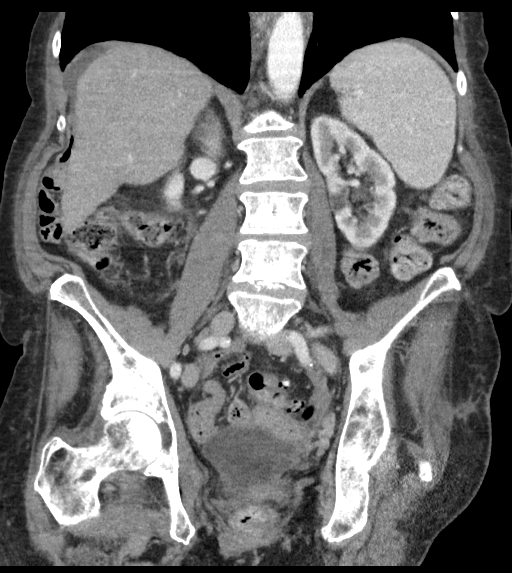

[16 of 46 positions shown; findings below may reference images not displayed]

FINDINGS: Lower chest: No acute abnormality. 8 mm right lower lobe pulmonary
nodule abutting the diaphragm, unchanged since June 2015, benign.
Dense mitral annulus calcification.

Hepatobiliary: Nodular contour of the liver, consistent with
cirrhosis. No focal liver abnormality. Small layering gallstones in
the gallbladder. Mild gallbladder wall thickening. No biliary
dilatation.

Pancreas: Unremarkable. No pancreatic ductal dilatation or
surrounding inflammatory changes.

Spleen: Normal in size.  Old granulomatous disease.

Adrenals/Urinary Tract: The adrenal glands are unremarkable.
Unchanged 1.8 cm simple cyst in the left kidney. No renal or
ureteral calculi. No hydronephrosis. The bladder is unremarkable.

Stomach/Bowel: Stomach is within normal limits. Appendix is
surgically absent. No evidence of bowel wall thickening, distention,
or inflammatory changes. Colonic diverticulosis. Moderate stool
burden throughout the colon.

Vascular/Lymphatic: Unchanged paraesophageal varices. Large
splenorenal shunt. The portal system is patent. Aortic
atherosclerosis. No lymphadenopathy.

Reproductive: Status post hysterectomy. No adnexal masses.

Other: Trace free fluid in the pelvis. Mild haziness of the
mesentery. No pneumoperitoneum.

Musculoskeletal: Unchanged insufficiency fracture of the left sacral
ala. New insufficiency fracture of the right sacral ala. Healing
left superior and inferior pubic rami fractures. Grade [DATE]
anterolisthesis at L5-S1 due to bilateral L5 pars defects, similar
to prior study.
IMPRESSION: 1. Cholelithiasis. Mild gallbladder wall thickening may be related
to underlying liver disease. If there is clinical concern for acute
cholecystitis, consider further evaluation with right upper quadrant
ultrasound.
2. Cirrhosis with sequelae of portal hypertension including
esophageal varices and large splenorenal portosystemic shunt.
3. New insufficiency fracture of the right sacral ala. Unchanged
insufficiency fracture of the left sacral ala.
4. Healing left superior and inferior pubic rami fractures.
5.  Aortic atherosclerosis ([id number]).

## 2018-08-06 DIAGNOSIS — G4730 Sleep apnea, unspecified: Secondary | ICD-10-CM | POA: Diagnosis not present

## 2018-08-06 DIAGNOSIS — E785 Hyperlipidemia, unspecified: Secondary | ICD-10-CM | POA: Diagnosis not present

## 2018-08-06 DIAGNOSIS — I69354 Hemiplegia and hemiparesis following cerebral infarction affecting left non-dominant side: Secondary | ICD-10-CM | POA: Diagnosis not present

## 2018-08-06 DIAGNOSIS — Z86718 Personal history of other venous thrombosis and embolism: Secondary | ICD-10-CM | POA: Diagnosis not present

## 2018-08-06 DIAGNOSIS — I10 Essential (primary) hypertension: Secondary | ICD-10-CM | POA: Diagnosis not present

## 2018-08-06 DIAGNOSIS — Z87440 Personal history of urinary (tract) infections: Secondary | ICD-10-CM | POA: Diagnosis not present

## 2018-08-06 DIAGNOSIS — Z7982 Long term (current) use of aspirin: Secondary | ICD-10-CM | POA: Diagnosis not present

## 2018-08-06 DIAGNOSIS — E119 Type 2 diabetes mellitus without complications: Secondary | ICD-10-CM | POA: Diagnosis not present

## 2018-08-06 DIAGNOSIS — I471 Supraventricular tachycardia: Secondary | ICD-10-CM | POA: Diagnosis not present

## 2018-08-06 DIAGNOSIS — Z9181 History of falling: Secondary | ICD-10-CM | POA: Diagnosis not present

## 2018-08-06 DIAGNOSIS — K760 Fatty (change of) liver, not elsewhere classified: Secondary | ICD-10-CM | POA: Diagnosis not present

## 2018-08-06 DIAGNOSIS — I2510 Atherosclerotic heart disease of native coronary artery without angina pectoris: Secondary | ICD-10-CM | POA: Diagnosis not present

## 2018-08-06 DIAGNOSIS — M21371 Foot drop, right foot: Secondary | ICD-10-CM | POA: Diagnosis not present

## 2018-08-06 DIAGNOSIS — Z7902 Long term (current) use of antithrombotics/antiplatelets: Secondary | ICD-10-CM | POA: Diagnosis not present

## 2018-08-06 DIAGNOSIS — R4781 Slurred speech: Secondary | ICD-10-CM | POA: Diagnosis not present

## 2018-08-06 DIAGNOSIS — I351 Nonrheumatic aortic (valve) insufficiency: Secondary | ICD-10-CM | POA: Diagnosis not present

## 2018-08-06 DIAGNOSIS — I69992 Facial weakness following unspecified cerebrovascular disease: Secondary | ICD-10-CM | POA: Diagnosis not present

## 2018-08-10 ENCOUNTER — Other Ambulatory Visit: Payer: Self-pay

## 2018-08-10 NOTE — Patient Outreach (Signed)
[facility]) Care Management  08/10/2018  [name] [birth year] [id number]   Medication Adherence call to [name] spoke with patient she is no longer taking Lisinopril 20 mg doctor took her off. [name] is showing past due under [place].   [place] Management Direct Dial [phone]  Fax [phone] [email or web address]

## 2018-08-19 ENCOUNTER — Ambulatory Visit (HOSPITAL_COMMUNITY)
Admission: RE | Admit: 2018-08-19 | Discharge: 2018-08-19 | Disposition: A | Discharge status: Home / Self Care | Payer: Medicare Other | Source: Ambulatory Visit | Attending: Vascular Surgery | Admitting: Vascular Surgery

## 2018-08-19 ENCOUNTER — Ambulatory Visit (INDEPENDENT_AMBULATORY_CARE_PROVIDER_SITE_OTHER): Payer: Medicare Other | Admitting: Vascular Surgery

## 2018-08-19 ENCOUNTER — Encounter: Payer: Self-pay | Admitting: Vascular Surgery

## 2018-08-19 ENCOUNTER — Other Ambulatory Visit: Payer: Self-pay

## 2018-08-19 VITALS — BP 144/52 | HR 72 | Temp 97.0°F | Resp 16 | Ht 63.0 in | Wt 130.0 lb

## 2018-08-19 DIAGNOSIS — I6523 Occlusion and stenosis of bilateral carotid arteries: Secondary | ICD-10-CM | POA: Diagnosis not present

## 2018-08-19 NOTE — Progress Notes (Signed)
Patient is a 77 year old female who returns for follow-up today.  She was originally seen September 2019 after a stroke.  Her symptoms were primarily left-sided weakness and slurred speech.  She had a CT angiogram at that time which suggested severe left internal carotid artery stenosis.  However, this was not confirmed on duplex ultrasound which was less than 40% stenosis bilaterally.  The patient is currently on aspirin and Plavix.  She is also on a statin.  She has recovered her speech fully.  She still has some residual weakness in her left leg.  She denies any new symptoms of TIA amaurosis or stroke.  She states that her blood pressure usually runs in the systolic 956 range at home.  Review of systems: She denies shortness of breath.  She denies chest pain.  She does have some occasional leg swelling.  Current Outpatient Medications on File Prior to Visit  Medication Sig Dispense Refill  . acetaminophen (TYLENOL) 325 MG tablet Take 2 tablets (650 mg total) by mouth every 6 (six) hours as needed for headache.    Marland Kitchen aspirin EC 81 MG tablet Take 81 mg by mouth daily.    Marland Kitchen atorvastatin (LIPITOR) 80 MG tablet Take 1 tablet (80 mg total) by mouth daily. 30 tablet 1  . calcium citrate (CALCITRATE - DOSED IN MG ELEMENTAL CALCIUM) 950 MG tablet Take 1 tablet (200 mg of elemental calcium total) by mouth daily. 30 tablet 1  . carvedilol (COREG) 12.5 MG tablet Take 1 tablet (12.5 mg total) by mouth 2 (two) times daily with a meal. 60 tablet 1  . clopidogrel (PLAVIX) 75 MG tablet Take 1 tablet (75 mg total) by mouth daily with breakfast. 30 tablet 1  . Furosemide (LASIX PO) Take by mouth as needed.    Marland Kitchen LORazepam (ATIVAN) 1 MG tablet Take 1 tablet (1 mg total) by mouth daily as needed. 30 tablet 0  . Multiple Vitamins-Minerals (CENTRUM ADULTS) TABS Take 1 tablet by mouth daily.    . niacin (NIASPAN) 500 MG CR tablet Take 1 tablet by mouth daily.    Marland Kitchen omeprazole (PRILOSEC) 20 MG capsule Take 1 capsule (20 mg  total) by mouth daily. 90 capsule 3  . ondansetron (ZOFRAN) 4 MG tablet Take 1 tablet (4 mg total) by mouth every 8 (eight) hours as needed for nausea or vomiting. 30 tablet 1  . Oxcarbazepine (TRILEPTAL) 300 MG tablet Take 1 tablet (300 mg total) by mouth 2 (two) times daily. 60 tablet 1  . ranitidine (ZANTAC) 150 MG tablet Take 1 tablet by mouth 2 (two) times daily as needed.     No current facility-administered medications on file prior to visit.     Physical exam:  Vitals:   08/19/18 1156 08/19/18 1159  BP: (!) 150/57 (!) 144/52  Pulse: 72   Resp: 16   Temp: (!) 97 F (36.1 C)   TempSrc: Oral   SpO2: 97%   Weight: 130 lb (59 kg)   Height: 5' 3"  (1.6 m)     Neck: Bilateral carotid bruits  Cardiac: Regular rate and rhythm with harsh systolic murmur heard throughout the precordium  Chest: Clear to auscultation bilaterally  Neuro: Symmetric upper extremity motor strength no obvious facial asymmetry 4/5 motor left leg compared to 5/5 right leg  Data: Patient had a carotid duplex exam today which showed again no significant internal carotid artery stenosis antegrade vertebral flow.  I reviewed and interpreted the study.  I also reviewed the patient's echocardiogram from  her hospitalization during her stroke.  This showed moderate to severe aortic regurgitation with mild to moderate mitral regurgitation severe left atrial dilation normal ejection fraction  Assessment: No significant carotid artery occlusive disease on 2 consecutive duplex scans currently asymptomatic  Plan: Continue Plavix aspirin statin  Patient will have a follow-up carotid duplex scan in 2 years and see our nurse practitioner.  If she has had no significant change we may be able to space that carotid duplex follow-up out or consider discontinuing it at that point.  Ruta Hinds, MD [facility] Office: [phone] Pager: [phone]

## 2018-08-21 ENCOUNTER — Other Ambulatory Visit (INDEPENDENT_AMBULATORY_CARE_PROVIDER_SITE_OTHER): Payer: Self-pay | Admitting: Internal Medicine

## 2018-08-21 DIAGNOSIS — R110 Nausea: Secondary | ICD-10-CM

## 2018-08-30 ENCOUNTER — Telehealth: Payer: Self-pay

## 2018-08-30 ENCOUNTER — Ambulatory Visit: Payer: Self-pay | Admitting: Adult Health

## 2018-08-30 NOTE — Telephone Encounter (Signed)
Patient no show for appt today.

## 2018-08-30 NOTE — Progress Notes (Deleted)
[facility] [address] [place]. [place] [address] [phone]       OFFICE FOLLOW UP NOTE  [name] Date of Birth:  July 02, 1941 Medical Record Number:  [id number]   Reason for Referral:  hospital stroke follow up  CHIEF COMPLAINT:  No chief complaint on file.   HPI: [name] is being seen today for initial visit in the office for right occipital infarct secondary to small vessel disease status post IV TPA on 07/06/2018. History obtained from *** and chart review. Reviewed all radiology images and labs personally.  Kafi Wardis a 77 y.o.femalewith history of type 2 diabetes, stroke, seizures, hypercholesterolemia, coronary artery disease and carotid stenosis bilaterally.Per discharge summary notes, her daughter noted that patient was in her normal state of health when her granddaughter noted a sudden onset of left facial droop, dysarthria, weakness in the left side.  She was driven to [facility] with the patient was evaluated by telemetry neurology and IV TPA was administered without complication and was transferred to [facility] for further evaluation and management.  Upon arriving to [facility], she had improved significantly for her daughter without further evidence of dysarthria or facial droop and improvement of left arm strength.  CT head reviewed and was negative for acute infarct but did show right MCA encephalomalacia along with old BG and bilateral cerebellar infarcts.  CTA head and neck was negative for emergent LVO with evidence of left ICA 60% stenosis, aortic arch right ICA bifurcation, left subclavian arthrosclerosis, right P2 stenosis and multilevel degenerative spinal changes.  CT perfusion no or infarct.  MRI brain reviewed showed punctate right occipital infarct with evolution of small left superior frontal gyrus lacunar infarct since 04/2018 and chronic right MCA encephalomalacia.  2D echo showed an EF of 65 to 70% without cardiac source  of embolus identified.  A1c 4.6.  LDL 50 and recommended continuation of atorvastatin 80 mg daily.  Patient was previously on aspirin 81 mg and Plavix 75 mg and was recommended to continue at discharge.  Patient does have a history of seizures approximately 1 year ago likely due to prior stroke with continuation of Trileptal and Keppra and is followed by Dr. Merlene Laughter outpatient.  EEG performed which was normal.  Recommended for discharge to CIR per PT/OT recommendations and was discharged in stable condition. Of note, patient was evaluated at [facility] on 05/07/2018 with left-sided weakness and slurring of speech.  Imaging did show punctate acute lacunar infarct in the superior left peri rolandic cortex.  It was recommended for her to initiate DAPT for 4 weeks followed by single agent therapy.  They also increase atorvastatin from 40 mg to 80 mg.  Therapies recommended home health therapies.  She was also found to have left ICA stenosis which is known and chronic.  It was felt as though her stenosis has likely become symptomatic and recommended vascular surgery outpatient follow-up. Patient was seen again at [facility] on 05/19/2018 with left-sided facial droop and slurred speech and diagnosed with TIA as all imaging negative.  Patient is being seen today for hospital follow-up.  She was evaluated by vascular surgery Dr. Oneida Alar with repeat carotid duplex and did not show significant carotid artery occlusive disease therefore recommended continuation of Plavix, aspirin and statin with repeat carotid duplex scan in 2 years time.   ROS:   14 system review of systems performed and negative with exception of ***  PMH:  Past Medical History:  Diagnosis Date  .  Anemia   . Aortic insufficiency    Moderate  . Back pain   . Carotid stenosis, bilateral   . Cirrhosis ([place])   . Closed left femoral fracture ([place]) 04/21/2017  . Coronary artery disease    Stent x 2 RCA 1995, cardiac catheterization  09/2016 showing only mild atherosclerosis  . DDD (degenerative disc disease), lumbar   . Essential hypertension   . Falls   . GERD (gastroesophageal reflux disease)   . Hiatal hernia   . History of stroke   . Hypercholesteremia   . IBS (irritable bowel syndrome)   . Non-alcoholic fatty liver disease   . OSA (obstructive sleep apnea)    no CPAP  . Pericardial effusion    a. small by echo 2018.  Marland Kitchen PSVT (paroxysmal supraventricular tachycardia) ([place])   . Recurrent UTI   . Seizures ([place])   . Stroke ([place])   . Type 2 diabetes mellitus ([place])    off medication  . Varices, esophageal ([facility])     PSH:  Past Surgical History:  Procedure Laterality Date  . [place]  . APPENDECTOMY    . BACK SURGERY  1985  . CARDIAC CATHETERIZATION  [phone]   Stent to the proximal RCA after MI   . CARDIAC CATHETERIZATION N/A 09/26/2016   Procedure: Left Heart Cath and Coronary Angiography;  Surgeon: Leonie Man, MD;  Location: [facility];  Service: Cardiovascular;  Laterality: N/A;  . CATARACT EXTRACTION W/PHACO Right 07/04/2014   Procedure: CATARACT EXTRACTION PHACO AND INTRAOCULAR LENS PLACEMENT (IOC);  Surgeon: Elta Guadeloupe T. Gershon Crane, MD;  Location: [facility];  Service: Ophthalmology;  Laterality: Right;  CDE:13.13  . COLONOSCOPY    . DILATION AND CURETTAGE OF UTERUS     x2  . Epi Retinal Membrane Peel Left   . ERCP    . ESOPHAGEAL BANDING N/A 04/01/2013   Procedure: ESOPHAGEAL BANDING;  Surgeon: Rogene Houston, MD;  Location: [facility];  Service: Endoscopy;  Laterality: N/A;  . ESOPHAGEAL BANDING N/A 05/24/2013   Procedure: ESOPHAGEAL BANDING;  Surgeon: Rogene Houston, MD;  [facility];  Service: Endoscopy;  Laterality: N/A;  . ESOPHAGEAL BANDING N/A 06/21/2014   Procedure: ESOPHAGEAL BANDING;  Surgeon: Rogene Houston, MD;  [facility];  Service: Endoscopy;  Laterality: N/A;  . ESOPHAGOGASTRODUODENOSCOPY N/A 04/01/2013   Procedure:  ESOPHAGOGASTRODUODENOSCOPY (EGD);  Surgeon: Rogene Houston, MD;  [facility];  Service: Endoscopy;  Laterality: N/A;  230-rescheduled to 8:30am Ann notified pt  . ESOPHAGOGASTRODUODENOSCOPY N/A 05/24/2013   Procedure: ESOPHAGOGASTRODUODENOSCOPY (EGD);  Surgeon: Rogene Houston, MD;  [facility];  Service: Endoscopy;  Laterality: N/A;  730  . ESOPHAGOGASTRODUODENOSCOPY N/A 06/21/2014   Procedure: ESOPHAGOGASTRODUODENOSCOPY (EGD);  Surgeon: Rogene Houston, MD;  [facility];  Service: Endoscopy;  Laterality: N/A;  930-rescheduled 10/14 @ 1200 Ann to notify pt  . EYE SURGERY  08   cataract surgery of the left eye  . FEMUR IM NAIL Left 03/02/2017   Procedure: INTRAMEDULLARY (IM) NAIL FEMORAL;  Surgeon: Rod Can, MD;  Location: [place];  Service: Orthopedics;  Laterality: Left;  . HARDWARE REMOVAL Right 01/17/2013   Procedure: REMOVAL OF HARDWARE AND EXCISION ULNAR STYLOID RIGHT WRIST;  Surgeon: Tennis Must, MD;  Location: [place];  Service: Orthopedics;  Laterality: Right;  . MYRINGOTOMY  2012   both ears  . ORIF FEMUR FRACTURE Left 04/23/2017   Procedure: OPEN  REDUCTION INTERNAL FIXATION (ORIF) DISTAL FEMUR FRACTURE (FRACTURE AROUND FEMORAL NAIL);  Surgeon: Altamese Peapack and Gladstone, MD;  Location: [place];  Service: Orthopedics;  Laterality: Left;  . TONSILLECTOMY    . VAGINAL HYSTERECTOMY  1972  . WRIST SURGERY     rt wrist hardwear removal    Social History:  Social History   Socioeconomic History  . Marital status: Widowed    Spouse name: Not on file  . Number of children: 2  . Years of education: Not on file  . Highest education level: Not on file  Occupational History    Employer: RETIRED  Social Needs  . Financial resource strain: Not on file  . Food insecurity:    Worry: Not on file    Inability: Not on file  . Transportation needs:    Medical: Not on file    Non-medical: Not on file  Tobacco Use  . Smoking status: Current  Some Day Smoker    Packs/day: 0.25    Years: 50.00    Pack years: 12.50    Types: Cigarettes  . Smokeless tobacco: Never Used  . Tobacco comment: 1 pk lasts a week.   Substance and Sexual Activity  . Alcohol use: No    Alcohol/week: 0.0 standard drinks  . Drug use: No  . Sexual activity: Not on file  Lifestyle  . Physical activity:    Days per week: Not on file    Minutes per session: Not on file  . Stress: Not on file  Relationships  . Social connections:    Talks on phone: Not on file    Gets together: Not on file    Attends religious service: Not on file    Active member of club or organization: Not on file    Attends meetings of clubs or organizations: Not on file    Relationship status: Not on file  . Intimate partner violence:    Fear of current or ex partner: Not on file    Emotionally abused: Not on file    Physically abused: Not on file    Forced sexual activity: Not on file  Other Topics Concern  . Not on file  Social History Narrative  . Not on file    Family History:  Family History  Problem Relation Age of Onset  . Diabetes Mother   . Heart failure Father   . Heart failure Maternal Aunt     Medications:   Current Outpatient Medications on File Prior to Visit  Medication Sig Dispense Refill  . acetaminophen (TYLENOL) 325 MG tablet Take 2 tablets (650 mg total) by mouth every 6 (six) hours as needed for headache.    Marland Kitchen aspirin EC 81 MG tablet Take 81 mg by mouth daily.    Marland Kitchen atorvastatin (LIPITOR) 80 MG tablet Take 1 tablet (80 mg total) by mouth daily. 30 tablet 1  . calcium citrate (CALCITRATE - DOSED IN MG ELEMENTAL CALCIUM) 950 MG tablet Take 1 tablet (200 mg of elemental calcium total) by mouth daily. 30 tablet 1  . carvedilol (COREG) 12.5 MG tablet Take 1 tablet (12.5 mg total) by mouth 2 (two) times daily with a meal. 60 tablet 1  . clopidogrel (PLAVIX) 75 MG tablet Take 1 tablet (75 mg total) by mouth daily with breakfast. 30 tablet 1  .  Furosemide (LASIX PO) Take by mouth as needed.    Marland Kitchen LORazepam (ATIVAN) 1 MG tablet Take 1 tablet (1 mg total) by mouth daily as needed. [place]  tablet 0  . Multiple Vitamins-Minerals (CENTRUM ADULTS) TABS Take 1 tablet by mouth daily.    . niacin (NIASPAN) 500 MG CR tablet Take 1 tablet by mouth daily.    Marland Kitchen omeprazole (PRILOSEC) 20 MG capsule Take 1 capsule (20 mg total) by mouth daily. 90 capsule 3  . ondansetron (ZOFRAN) 4 MG tablet TAKE (1) TABLET BY MOUTH EVERY 8 HOURS AS NEEDED. 30 tablet 0  . Oxcarbazepine (TRILEPTAL) 300 MG tablet Take 1 tablet (300 mg total) by mouth 2 (two) times daily. 60 tablet 1  . ranitidine (ZANTAC) 150 MG tablet Take 1 tablet by mouth 2 (two) times daily as needed.     No current facility-administered medications on file prior to visit.     Allergies:   Allergies  Allergen Reactions  . Tape Rash and Other (See Comments)    Please use paper tape     Physical Exam  There were no vitals filed for this visit. There is no height or weight on file to calculate BMI. No exam data present  General: well developed, well nourished, seated, in no evident distress Head: head normocephalic and atraumatic.   Neck: supple with no carotid or supraclavicular bruits Cardiovascular: regular rate and rhythm, no murmurs Musculoskeletal: no deformity Skin:  no rash/petichiae Vascular:  Normal pulses all extremities  Neurologic Exam Mental Status: Awake and fully alert. Oriented to place and time. Recent and remote memory intact. Attention span, concentration and fund of knowledge appropriate. Mood and affect appropriate.  Cranial Nerves: Fundoscopic exam reveals sharp disc margins. Pupils equal, briskly reactive to light. Extraocular movements full without nystagmus. Visual fields full to confrontation. Hearing intact. Facial sensation intact. Face, tongue, palate moves normally and symmetrically.  Motor: Normal bulk and tone. Normal strength in all tested extremity  muscles. Sensory.: intact to touch , pinprick , position and vibratory sensation.  Coordination: Rapid alternating movements normal in all extremities. Finger-to-nose and heel-to-shin performed accurately bilaterally. Gait and Station: Arises from chair without difficulty. Stance is normal. Gait demonstrates normal stride length and balance. Able to heel, toe and tandem walk without difficulty.  Reflexes: 1+ and symmetric. Toes downgoing.    NIHSS  *** Modified Rankin  ***    Diagnostic Data (Labs, Imaging, Testing)  CT HEAD WO CONTRAST 07/06/2018 IMPRESSION: 1. No acute intracranial abnormality. 2. Stable right MCA territory encephalomalacia without definite expansion of the infarct territory. 3. Remote lacunar infarcts of the basal ganglia and cerebellum bilaterally are stable. 4. Atherosclerosis. 5. ASPECTS is 10/10  CT CEREBRAL PERFUSION W CONTRAST CT ANGIO HEAD W OR WO CONTRAST CT ANGIO NECK W OR WO CONTRAST 07/06/2018 IMPRESSION: 1. CT perfusion demonstrates no areas of core infarct or ischemic tissue at risk. 2. CTA demonstrates no emergent large vessel occlusion. 3. 60% stenosis of the left internal carotid artery is stable. 4. Atherosclerotic changes at the aortic arch and right carotid bifurcation are stable without significant stenoses. 5. Left subclavian artery atherosclerotic disease without significant stenosis. 6. Stable calcifications of the cavernous internal carotid arteries bilaterally without significant stenoses. 7. Stable moderate right P2 segment stenosis. 8. Multilevel degenerative changes of the cervical spine are stable.  MR BRAIN WO CONTRAST 07/07/2018 IMPRESSION: 1. Punctate new white matter infarct in the anterior right occipital lobe. No hemorrhage or mass effect. 2. No other acute intracranial abnormality. Evolution of the small left superior frontal gyrus lacune since August with persistent signal abnormality on trace  diffusion. Chronic middle right MCA territory encephalomalacia. Chronic underlying small vessel  disease.  ECHOCARDIOGRAM 07/07/2018 Study Conclusions  - Left ventricle: The cavity size was normal. Wall thickness was   increased in a pattern of moderate LVH. Systolic function was   vigorous. The estimated ejection fraction was in the range of 65%   to 70%. Features are consistent with a pseudonormal left   ventricular filling pattern, with concomitant abnormal relaxation   and increased filling pressure (grade 2 diastolic dysfunction). - Aortic valve: Mildly calcified annulus. There was moderate to   severe regurgitation. - Mitral valve: There was mild to moderate regurgitation. - Left atrium: The atrium was severely dilated.  EEG ADULT 07/07/2018 Clinical Interpretation: This normal EEG is recorded in the waking and drowsy state.  Though with it rhythms are commonly slightly asymmetric, the asymmetry here could be suggestive of a right hemispheric cortical dysfunction(consistent with the patient's known encephalomalacia).  There was no seizure or seizure predisposition recorded on this study. Please note that a normal EEG does not preclude the possibility of epilepsy.     ASSESSMENT: [name] is a 77 y.o. year old female here with punctate right occipital infarct status post IV TPA on 07/06/2018 secondary to small vessel disease. Vascular risk factors include prior infarct, seizures, HLD, HTN, CAD and carotid stenosis bilaterally.     PLAN:  1. Right occipital infarct: Continue aspirin 81 mg daily and clopidogrel 75 mg daily  and atorvastatin 80 mg for secondary stroke prevention. Maintain strict control of hypertension with blood pressure goal below 130/90, diabetes with hemoglobin A1c goal below 6.5% and cholesterol with LDL cholesterol (bad cholesterol) goal below 70 mg/dL.  I also advised the patient to eat a healthy diet with plenty of whole grains, cereals, fruits and  vegetables, exercise regularly with at least 30 minutes of continuous activity daily and maintain ideal body weight. 2. HTN: Advised to continue current treatment regimen.  Today's BP ***.  Advised to continue to monitor at home along with continued follow-up with PCP for management 3. HLD: Advised to continue current treatment regimen along with continued follow-up with PCP for future prescribing and monitoring of lipid panel 4. Seizures:     Follow up in *** or call earlier if needed   Greater than 50% of time during this 25 minute visit was spent on counseling, explanation of diagnosis of ***, reviewing risk factor management of ***, planning of further management along with potential future management, and discussion with patient and family answering all questions.    Venancio Poisson, AGNP-BC  [facility] [address] [place], [place] [address]  Phone [phone] Fax [phone] Note: This document was prepared with digital dictation and possible smart phrase technology. Any transcriptional errors that result from this process are unintentional.

## 2018-09-02 ENCOUNTER — Encounter: Payer: Self-pay | Admitting: Adult Health

## 2018-09-10 ENCOUNTER — Ambulatory Visit: Payer: Self-pay | Admitting: Physical Medicine & Rehabilitation

## 2018-09-21 ENCOUNTER — Telehealth: Payer: Self-pay

## 2018-09-21 ENCOUNTER — Other Ambulatory Visit (INDEPENDENT_AMBULATORY_CARE_PROVIDER_SITE_OTHER): Payer: Self-pay | Admitting: Internal Medicine

## 2018-09-21 DIAGNOSIS — R110 Nausea: Secondary | ICD-10-CM

## 2018-09-21 DIAGNOSIS — I6381 Other cerebral infarction due to occlusion or stenosis of small artery: Secondary | ICD-10-CM

## 2018-09-21 DIAGNOSIS — I639 Cerebral infarction, unspecified: Secondary | ICD-10-CM

## 2018-09-21 NOTE — Telephone Encounter (Signed)
Okay to order outpatient PT

## 2018-09-21 NOTE — Telephone Encounter (Signed)
Referral placed.

## 2018-09-21 NOTE — Telephone Encounter (Signed)
Pt called requesting that her outpt PT be in [place]. She states she finished [facility] about a month ago. I do not see any message from therapies about a discharge from [facility].

## 2018-09-22 DIAGNOSIS — G894 Chronic pain syndrome: Secondary | ICD-10-CM | POA: Diagnosis not present

## 2018-09-22 DIAGNOSIS — Z0001 Encounter for general adult medical examination with abnormal findings: Secondary | ICD-10-CM | POA: Diagnosis not present

## 2018-09-22 DIAGNOSIS — E7849 Other hyperlipidemia: Secondary | ICD-10-CM | POA: Diagnosis not present

## 2018-09-22 DIAGNOSIS — Z1389 Encounter for screening for other disorder: Secondary | ICD-10-CM | POA: Diagnosis not present

## 2018-09-22 DIAGNOSIS — N342 Other urethritis: Secondary | ICD-10-CM | POA: Diagnosis not present

## 2018-09-22 DIAGNOSIS — E1140 Type 2 diabetes mellitus with diabetic neuropathy, unspecified: Secondary | ICD-10-CM | POA: Diagnosis not present

## 2018-09-22 DIAGNOSIS — I10 Essential (primary) hypertension: Secondary | ICD-10-CM | POA: Diagnosis not present

## 2018-09-22 DIAGNOSIS — E782 Mixed hyperlipidemia: Secondary | ICD-10-CM | POA: Diagnosis not present

## 2018-09-22 DIAGNOSIS — Z79899 Other long term (current) drug therapy: Secondary | ICD-10-CM | POA: Diagnosis not present

## 2018-09-24 ENCOUNTER — Other Ambulatory Visit: Payer: Self-pay

## 2018-09-24 NOTE — Patient Outreach (Signed)
Telephone outreach to patient to obtain mRs was successfully completed. mRs= 3.

## 2018-09-27 ENCOUNTER — Encounter: Payer: Medicare Other | Attending: Physical Medicine & Rehabilitation

## 2018-09-27 ENCOUNTER — Ambulatory Visit: Payer: Medicare Other | Admitting: Physical Medicine & Rehabilitation

## 2018-09-27 ENCOUNTER — Encounter: Payer: Self-pay | Admitting: Physical Medicine & Rehabilitation

## 2018-09-27 VITALS — BP 152/65 | HR 78 | Ht 63.0 in | Wt 140.0 lb

## 2018-09-27 DIAGNOSIS — G4733 Obstructive sleep apnea (adult) (pediatric): Secondary | ICD-10-CM | POA: Insufficient documentation

## 2018-09-27 DIAGNOSIS — I639 Cerebral infarction, unspecified: Secondary | ICD-10-CM

## 2018-09-27 DIAGNOSIS — R269 Unspecified abnormalities of gait and mobility: Secondary | ICD-10-CM

## 2018-09-27 DIAGNOSIS — Z955 Presence of coronary angioplasty implant and graft: Secondary | ICD-10-CM | POA: Insufficient documentation

## 2018-09-27 DIAGNOSIS — Z8673 Personal history of transient ischemic attack (TIA), and cerebral infarction without residual deficits: Secondary | ICD-10-CM | POA: Insufficient documentation

## 2018-09-27 DIAGNOSIS — I69398 Other sequelae of cerebral infarction: Secondary | ICD-10-CM

## 2018-09-27 DIAGNOSIS — K449 Diaphragmatic hernia without obstruction or gangrene: Secondary | ICD-10-CM | POA: Diagnosis not present

## 2018-09-27 DIAGNOSIS — I10 Essential (primary) hypertension: Secondary | ICD-10-CM | POA: Diagnosis not present

## 2018-09-27 DIAGNOSIS — E7800 Pure hypercholesterolemia, unspecified: Secondary | ICD-10-CM | POA: Insufficient documentation

## 2018-09-27 DIAGNOSIS — I2510 Atherosclerotic heart disease of native coronary artery without angina pectoris: Secondary | ICD-10-CM | POA: Insufficient documentation

## 2018-09-27 DIAGNOSIS — I6381 Other cerebral infarction due to occlusion or stenosis of small artery: Secondary | ICD-10-CM

## 2018-09-27 DIAGNOSIS — E119 Type 2 diabetes mellitus without complications: Secondary | ICD-10-CM | POA: Insufficient documentation

## 2018-09-27 DIAGNOSIS — F17210 Nicotine dependence, cigarettes, uncomplicated: Secondary | ICD-10-CM | POA: Diagnosis not present

## 2018-09-27 DIAGNOSIS — K589 Irritable bowel syndrome without diarrhea: Secondary | ICD-10-CM | POA: Diagnosis not present

## 2018-09-27 DIAGNOSIS — Z79899 Other long term (current) drug therapy: Secondary | ICD-10-CM | POA: Diagnosis not present

## 2018-09-27 DIAGNOSIS — E7849 Other hyperlipidemia: Secondary | ICD-10-CM | POA: Insufficient documentation

## 2018-09-27 DIAGNOSIS — R569 Unspecified convulsions: Secondary | ICD-10-CM | POA: Insufficient documentation

## 2018-09-27 DIAGNOSIS — K7460 Unspecified cirrhosis of liver: Secondary | ICD-10-CM | POA: Insufficient documentation

## 2018-09-27 DIAGNOSIS — M25552 Pain in left hip: Secondary | ICD-10-CM | POA: Diagnosis not present

## 2018-09-27 DIAGNOSIS — I6523 Occlusion and stenosis of bilateral carotid arteries: Secondary | ICD-10-CM | POA: Insufficient documentation

## 2018-09-27 DIAGNOSIS — K219 Gastro-esophageal reflux disease without esophagitis: Secondary | ICD-10-CM | POA: Insufficient documentation

## 2018-09-27 DIAGNOSIS — M79605 Pain in left leg: Secondary | ICD-10-CM | POA: Insufficient documentation

## 2018-09-27 DIAGNOSIS — Z8249 Family history of ischemic heart disease and other diseases of the circulatory system: Secondary | ICD-10-CM | POA: Diagnosis not present

## 2018-09-27 DIAGNOSIS — F329 Major depressive disorder, single episode, unspecified: Secondary | ICD-10-CM | POA: Diagnosis not present

## 2018-09-27 DIAGNOSIS — M21372 Foot drop, left foot: Secondary | ICD-10-CM | POA: Diagnosis not present

## 2018-09-27 NOTE — Patient Instructions (Signed)
[facility] Na PT should call you for appt

## 2018-09-27 NOTE — Progress Notes (Signed)
Subjective:    Patient ID: [name], female    DOB: May 19, 1941, 78 y.o.   MRN: [id number] 07/06/2018 with acute onset of left facial droop, slurred speech, left-sided weakness.  Cranial CT scan unremarkable.  Per report remote lacunar infarctions at the basal ganglion cerebellum bilaterally.  The patient did  receive tPA.  CT angiogram of head and neck showed no emergent large vessel occlusion;  60% stenosis of left internal carotid artery.  MRI showed punctate new white matter infarction in the anterior right occipital lobe.  No hemorrhage or mass effect.   Echocardiogram with ejection fraction of 70%, grade II diastolic dysfunction.  EEG negative for seizure.    HPI Hx R MCA 2015 Dressing  And bathing Mod I Amb with RW in home no physical assist Doing leg lifts Done with home health  Pain Inventory Average Pain 4 Pain Right Now 5 My pain is stabbing  In the last 24 hours, has pain interfered with the following? General activity 0 Relation with others 0 Enjoyment of life 0 What TIME of day is your pain at its worst? night Sleep (in general) Poor  Pain is worse with: sitting Pain improves with: rest Relief from Meds: 10  Mobility ability to climb steps?  no do you drive?  no  Function retired  Neuro/Psych No problems in this area  Prior Studies Any changes since last visit?  no  Physicians involved in your care Any changes since last visit?  no   Family History  Problem Relation Age of Onset  . Diabetes Mother   . Heart failure Father   . Heart failure Maternal Aunt    Social History   Socioeconomic History  . Marital status: Widowed    Spouse name: Not on file  . Number of children: 2  . Years of education: Not on file  . Highest education level: Not on file  Occupational History    Employer: RETIRED  Social Needs  . Financial resource strain: Not on file  . Food insecurity:    Worry: Not on file    Inability: Not on file  . Transportation  needs:    Medical: Not on file    Non-medical: Not on file  Tobacco Use  . Smoking status: Current Some Day Smoker    Packs/day: 0.25    Years: 50.00    Pack years: 12.50    Types: Cigarettes  . Smokeless tobacco: Never Used  . Tobacco comment: 1 pk lasts a week.   Substance and Sexual Activity  . Alcohol use: No    Alcohol/week: 0.0 standard drinks  . Drug use: No  . Sexual activity: Not on file  Lifestyle  . Physical activity:    Days per week: Not on file    Minutes per session: Not on file  . Stress: Not on file  Relationships  . Social connections:    Talks on phone: Not on file    Gets together: Not on file    Attends religious service: Not on file    Active member of club or organization: Not on file    Attends meetings of clubs or organizations: Not on file    Relationship status: Not on file  Other Topics Concern  . Not on file  Social History Narrative  . Not on file   Past Surgical History:  Procedure Laterality Date  . [place]  . APPENDECTOMY    . BACK SURGERY  1985  .  CARDIAC CATHETERIZATION  [phone]   Stent to the proximal RCA after MI   . CARDIAC CATHETERIZATION N/A 09/26/2016   Procedure: Left Heart Cath and Coronary Angiography;  Surgeon: Leonie Man, MD;  Location: [facility];  Service: Cardiovascular;  Laterality: N/A;  . CATARACT EXTRACTION W/PHACO Right 07/04/2014   Procedure: CATARACT EXTRACTION PHACO AND INTRAOCULAR LENS PLACEMENT (IOC);  Surgeon: Elta Guadeloupe T. Gershon Crane, MD;  Location: [facility];  Service: Ophthalmology;  Laterality: Right;  CDE:13.13  . COLONOSCOPY    . DILATION AND CURETTAGE OF UTERUS     x2  . Epi Retinal Membrane Peel Left   . ERCP    . ESOPHAGEAL BANDING N/A 04/01/2013   Procedure: ESOPHAGEAL BANDING;  Surgeon: Rogene Houston, MD;  Location: [facility];  Service: Endoscopy;  Laterality: N/A;  . ESOPHAGEAL BANDING N/A 05/24/2013   Procedure: ESOPHAGEAL BANDING;  Surgeon: Rogene Houston, MD;   [facility];  Service: Endoscopy;  Laterality: N/A;  . ESOPHAGEAL BANDING N/A 06/21/2014   Procedure: ESOPHAGEAL BANDING;  Surgeon: Rogene Houston, MD;  [facility];  Service: Endoscopy;  Laterality: N/A;  . ESOPHAGOGASTRODUODENOSCOPY N/A 04/01/2013   Procedure: ESOPHAGOGASTRODUODENOSCOPY (EGD);  Surgeon: Rogene Houston, MD;  [facility];  Service: Endoscopy;  Laterality: N/A;  230-rescheduled to 8:30am Ann notified pt  . ESOPHAGOGASTRODUODENOSCOPY N/A 05/24/2013   Procedure: ESOPHAGOGASTRODUODENOSCOPY (EGD);  Surgeon: Rogene Houston, MD;  [facility];  Service: Endoscopy;  Laterality: N/A;  730  . ESOPHAGOGASTRODUODENOSCOPY N/A 06/21/2014   Procedure: ESOPHAGOGASTRODUODENOSCOPY (EGD);  Surgeon: Rogene Houston, MD;  [facility];  Service: Endoscopy;  Laterality: N/A;  930-rescheduled 10/14 @ 1200 Ann to notify pt  . EYE SURGERY  08   cataract surgery of the left eye  . FEMUR IM NAIL Left 03/02/2017   Procedure: INTRAMEDULLARY (IM) NAIL FEMORAL;  Surgeon: Rod Can, MD;  Location: [facility];  Service: Orthopedics;  Laterality: Left;  . HARDWARE REMOVAL Right 01/17/2013   Procedure: REMOVAL OF HARDWARE AND EXCISION ULNAR STYLOID RIGHT WRIST;  Surgeon: Tennis Must, MD;  Location: [facility];  Service: Orthopedics;  Laterality: Right;  . MYRINGOTOMY  2012   both ears  . ORIF FEMUR FRACTURE Left 04/23/2017   Procedure: OPEN REDUCTION INTERNAL FIXATION (ORIF) DISTAL FEMUR FRACTURE (FRACTURE AROUND FEMORAL NAIL);  Surgeon: Altamese Naranjito, MD;  Location: [facility];  Service: Orthopedics;  Laterality: Left;  . TONSILLECTOMY    . VAGINAL HYSTERECTOMY  1972  . WRIST SURGERY     rt wrist hardwear removal   Past Medical History:  Diagnosis Date  . Anemia   . Aortic insufficiency    Moderate  . Back pain   . Carotid stenosis, bilateral   . Cirrhosis ([place])   . Closed left femoral fracture ([place]) 04/21/2017  . Coronary artery  disease    Stent x 2 RCA 1995, cardiac catheterization 09/2016 showing only mild atherosclerosis  . DDD (degenerative disc disease), lumbar   . Essential hypertension   . Falls   . GERD (gastroesophageal reflux disease)   . Hiatal hernia   . History of stroke   . Hypercholesteremia   . IBS (irritable bowel syndrome)   . Non-alcoholic fatty liver disease   . OSA (obstructive sleep apnea)    no CPAP  . Pericardial effusion    a. small by echo 2018.  Marland Kitchen PSVT (paroxysmal supraventricular tachycardia) ([place])   . Recurrent UTI   .  Seizures ([place])   . Stroke ([place])   . Type 2 diabetes mellitus ([place])    off medication  . Varices, esophageal ([facility])    BP (!) 152/65   Pulse 78   Ht 5' 3"  (1.6 m)   Wt 140 lb (63.5 kg)   SpO2 94%   BMI 24.80 kg/m   Opioid Risk Score:   Fall Risk Score:  `1  Depression screen PHQ 2/9  Depression screen [facility] 2/9 07/29/2018 05/13/2018 12/28/2017 09/15/2017 07/06/2017 03/09/2017  Decreased Interest 0 0 0 0 0 0  Down, Depressed, Hopeless 0 1 0 1 0 0  PHQ - 2 Score 0 1 0 1 0 0  Altered sleeping 1 - - - - -  Tired, decreased energy 0 - - - - -  Change in appetite 0 - - - - -  Feeling bad or failure about yourself  0 - - - - -  Trouble concentrating 0 - - - - -  Moving slowly or fidgety/restless 0 - - - - -  Suicidal thoughts 0 - - - - -  PHQ-9 Score 1 - - - - -  Difficult doing work/chores Somewhat difficult - - - - -  Some recent data might be hidden    Review of Systems  Constitutional: Negative.   HENT: Negative.   Eyes: Negative.   Respiratory: Negative.   Cardiovascular: Negative.   Gastrointestinal: Positive for nausea.  Endocrine: Negative.   Genitourinary: Negative.   Musculoskeletal: Positive for gait problem.  Skin: Negative.   Allergic/Immunologic: Negative.   Hematological: Negative.   Psychiatric/Behavioral: Negative.   All other systems reviewed and are negative.      Objective:   Physical Exam Vitals signs and nursing note  reviewed.  Constitutional:      Appearance: Normal appearance.  HENT:     Head: Normocephalic.     Nose: Nose normal.     Mouth/Throat:     Mouth: Mucous membranes are moist.  Eyes:     Extraocular Movements: Extraocular movements intact.     Conjunctiva/sclera: Conjunctivae normal.     Pupils: Pupils are equal, round, and reactive to light.  Musculoskeletal:     Right foot: No Charcot foot or foot drop.     Left foot: Foot drop present. No Charcot foot.     Comments: No pain or swelling in ankles Reduced Left ankle AROM  Feet:     Right foot:     Skin integrity: Skin integrity normal.     Left foot:     Skin integrity: Skin integrity normal.  Skin:    General: Skin is warm and dry.  Neurological:     General: No focal deficit present.     Mental Status: She is alert and oriented to person, place, and time.     Sensory: Sensation is intact.     Motor: Atrophy present.     Gait: Gait abnormal.     Comments: 5/5 in B Delt, bi, tri, grip HF, KE,  3/5 Left ankle DF, 5/5 Right ankle DF  Ambulates with walker Mod I using Left double metal upright AFO  Ambulates with supervision without walker  Visual fields intact bilaterally   Psychiatric:        Mood and Affect: Mood normal.        Behavior: Behavior normal.           Assessment & Plan:  1.  Small right occipital infarct, no new visual field defect,  has decline in balance which persists, ADL fxn back to baseline. Order OP PT at [facility]  2.  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

## 2018-10-06 ENCOUNTER — Other Ambulatory Visit: Payer: Self-pay

## 2018-10-06 ENCOUNTER — Encounter (HOSPITAL_COMMUNITY): Payer: Self-pay | Admitting: Physical Therapy

## 2018-10-06 ENCOUNTER — Ambulatory Visit (HOSPITAL_COMMUNITY): Payer: Medicare Other | Attending: Physical Medicine & Rehabilitation | Admitting: Physical Therapy

## 2018-10-06 DIAGNOSIS — R29818 Other symptoms and signs involving the nervous system: Secondary | ICD-10-CM | POA: Diagnosis not present

## 2018-10-06 DIAGNOSIS — M6281 Muscle weakness (generalized): Secondary | ICD-10-CM | POA: Diagnosis not present

## 2018-10-06 DIAGNOSIS — R2689 Other abnormalities of gait and mobility: Secondary | ICD-10-CM

## 2018-10-06 DIAGNOSIS — R2681 Unsteadiness on feet: Secondary | ICD-10-CM | POA: Diagnosis not present

## 2018-10-06 NOTE — Therapy (Addendum)
[place], [place], [address] Phone: [phone]   Fax:  [phone]  Physical Therapy Evaluation  Patient Details  Name: [name] MRN: [id number] Date of Birth: [birth year] Referring Provider (PT): Letta Pate Luanna Salk, MD   Encounter Date: 10/06/2018  PT End of Session - 10/06/18 1539    Visit Number  1    Number of Visits  19    Date for PT Re-Evaluation  11/17/18   Mini re-assess 10/27/18   Authorization Type  [employer] Medicare    Authorization Time Period  10/06/18 - 11/17/18    Authorization - Visit Number  1    Authorization - Number of Visits  10    PT Start Time  0962    PT Stop Time  1530    PT Time Calculation (min)  59 min    Equipment Utilized During Treatment  Gait belt    Activity Tolerance  Patient tolerated treatment well;No increased pain    Behavior During Therapy  WFL for tasks assessed/performed       Past Medical History:  Diagnosis Date  . Anemia   . Aortic insufficiency    Moderate  . Back pain   . Carotid stenosis, bilateral   . Cirrhosis ([place])   . Closed left femoral fracture ([place]) 04/21/2017  . Coronary artery disease    Stent x 2 RCA 1995, cardiac catheterization 09/2016 showing only mild atherosclerosis  . DDD (degenerative disc disease), lumbar   . Essential hypertension   . Falls   . GERD (gastroesophageal reflux disease)   . Hiatal hernia   . History of stroke   . Hypercholesteremia   . IBS (irritable bowel syndrome)   . Non-alcoholic fatty liver disease   . OSA (obstructive sleep apnea)    no CPAP  . Pericardial effusion    a. small by echo 2018.  Marland Kitchen PSVT (paroxysmal supraventricular tachycardia) ([place])   . Recurrent UTI   . Seizures ([place])   . Stroke ([place])   . Type 2 diabetes mellitus ([place])    off medication  . Varices, esophageal ([place])     Past Surgical History:  Procedure Laterality Date  . [place]  . APPENDECTOMY    . BACK SURGERY  1985   . CARDIAC CATHETERIZATION  [phone]   Stent to the proximal RCA after MI   . CARDIAC CATHETERIZATION N/A 09/26/2016   Procedure: Left Heart Cath and Coronary Angiography;  Surgeon: Leonie Man, MD;  Location: [facility];  Service: Cardiovascular;  Laterality: N/A;  . CATARACT EXTRACTION W/PHACO Right 07/04/2014   Procedure: CATARACT EXTRACTION PHACO AND INTRAOCULAR LENS PLACEMENT (IOC);  Surgeon: Elta Guadeloupe T. Gershon Crane, MD;  Location: [facility];  Service: Ophthalmology;  Laterality: Right;  CDE:13.13  . COLONOSCOPY    . DILATION AND CURETTAGE OF UTERUS     x2  . Epi Retinal Membrane Peel Left   . ERCP    . ESOPHAGEAL BANDING N/A 04/01/2013   Procedure: ESOPHAGEAL BANDING;  Surgeon: Rogene Houston, MD;  [facility];  Service: Endoscopy;  Laterality: N/A;  . ESOPHAGEAL BANDING N/A 05/24/2013   Procedure: ESOPHAGEAL BANDING;  Surgeon: Rogene Houston, MD;  [facility];  Service: Endoscopy;  Laterality: N/A;  . ESOPHAGEAL BANDING N/A 06/21/2014   Procedure: ESOPHAGEAL BANDING;  Surgeon: Rogene Houston, MD;  [facility];  Service: Endoscopy;  Laterality: N/A;  . ESOPHAGOGASTRODUODENOSCOPY N/A 04/01/2013  Procedure: ESOPHAGOGASTRODUODENOSCOPY (EGD);  Surgeon: Rogene Houston, MD;  [facility];  Service: Endoscopy;  Laterality: N/A;  230-rescheduled to 8:30am Ann notified pt  . ESOPHAGOGASTRODUODENOSCOPY N/A 05/24/2013   Procedure: ESOPHAGOGASTRODUODENOSCOPY (EGD);  Surgeon: Rogene Houston, MD;  [facility];  Service: Endoscopy;  Laterality: N/A;  730  . ESOPHAGOGASTRODUODENOSCOPY N/A 06/21/2014   Procedure: ESOPHAGOGASTRODUODENOSCOPY (EGD);  Surgeon: Rogene Houston, MD;  [facility];  Service: Endoscopy;  Laterality: N/A;  930-rescheduled 10/14 @ 1200 Ann to notify pt  . EYE SURGERY  08   cataract surgery of the left eye  . FEMUR IM NAIL Left 03/02/2017   Procedure: INTRAMEDULLARY (IM) NAIL FEMORAL;  Surgeon:  Rod Can, MD;  Location: [facility];  Service: Orthopedics;  Laterality: Left;  . HARDWARE REMOVAL Right 01/17/2013   Procedure: REMOVAL OF HARDWARE AND EXCISION ULNAR STYLOID RIGHT WRIST;  Surgeon: Tennis Must, MD;  Location: [facility];  Service: Orthopedics;  Laterality: Right;  . MYRINGOTOMY  2012   both ears  . ORIF FEMUR FRACTURE Left 04/23/2017   Procedure: OPEN REDUCTION INTERNAL FIXATION (ORIF) DISTAL FEMUR FRACTURE (FRACTURE AROUND FEMORAL NAIL);  Surgeon: Alt[place]e [place], MD;  Location: [facility];  Service: Orthopedics;  Laterality: Left;  . TONSILLECTOMY    . VAGINAL HYSTERECTOMY  1972  . WRIST SURGERY     rt wrist hardwear removal    There were no vitals filed for this visit.   Subjective Assessment - 10/06/18 1441    Subjective  Patient reported that she thinks she had a stroke in August of 2019, and then she had another stroke about 3 months ago which made her walking more difficult. Patient stated after her most recent stroke she is not able to walk as far and that she requires use of a walker all of the time. She stated that after the first stroke she was able to walk at times without the walker. She stated she fell when she had the second stroke and that is what has caused her wrist to hurt.     Pertinent History  Stroke on 07/06/18 with administration of TPA per chart review; History of previous TIAs/strokes and history of seizures, DMII, CAD, Carotid artery stenosis    Limitations  Standing;Walking    How long can you sit comfortably?  Not limited    How long can you stand comfortably?  15-20 minutes    How long can you walk comfortably?  10 minutes    Diagnostic tests  MRI 07/07/18: "punctate new white matter infarct in the anterior right occipital" "No acute intracranial abnormality" See imaging for further details    Patient Stated Goals  Walk a little better    Currently in Pain?  Yes    Pain Score  8     Pain Location  Wrist    Pain Orientation  Right     Pain Descriptors / Indicators  Aching;Sharp    Pain Type  Chronic pain    Pain Onset  More than a month ago    Pain Frequency  Intermittent    Aggravating Factors   Using her arm    Pain Relieving Factors  Nothing    Effect of Pain on Daily Activities  Moderately affects    Multiple Pain Sites  No         [facility] PT Assessment - 10/06/18 0001      Assessment   Medical Diagnosis  Basal Ganglia Infarction  Referring Provider (PT)  Kirsteins, Luanna Salk, MD    Next MD Visit  --   In 3 months   Prior Therapy  Some home health physical therapy      Balance Screen   Has the patient fallen in the past 6 months  Yes    How many times?  1    Has the patient had a decrease in activity level because of a fear of falling?   Yes    Is the patient reluctant to leave their home because of a fear of falling?   No      Home Environment   Living Environment  Private residence    Living Arrangements  Other relatives    Type of [place] to enter    Entrance Stairs-Number of Steps  3    Entrance Stairs-Rails  Left   going up   [place]  One level    [place] - 2 wheels;Cane - single point      Prior Function   Level of Independence  Independent with basic ADLs;Independent with community mobility with device    Vocation  Retired    Leisure  Fish farm manager   Overall Cognitive Status  Within Functional Limits for tasks assessed      Posture/Postural Control   Posture/Postural Control  Postural limitations    Postural Limitations  Rounded Shoulders;Forward head;Decreased lumbar lordosis;Flexed trunk      ROM / Strength   AROM / PROM / Strength  Strength      Strength   Strength Assessment Site  Hip;Knee;Ankle    Right/Left Hip  Right;Left    Right Hip Flexion  4-/5    Right Hip Extension  3-/5    Right Hip ABduction  4/5    Left Hip Flexion  4-/5    Left Hip Extension  2-/5    Left Hip ABduction  4+/5    Right/Left Knee   Right;Left    Right Knee Flexion  4+/5    Right Knee Extension  4+/5    Left Knee Flexion  4+/5    Left Knee Extension  4+/5    Right/Left Ankle  Right;Left    Right Ankle Dorsiflexion  4+/5    Left Ankle Dorsiflexion  2/5      Ambulation/Gait   Ambulation/Gait  Yes    Ambulation Distance (Feet)  50 Feet    Assistive device  Rolling walker    Gait Pattern  Decreased step length - left;Decreased step length - right;Decreased stride length;Poor foot clearance - left    Ambulation Surface  Level;Indoor      Standardized Balance Assessment   Standardized Balance Assessment  Berg Balance Test      Berg Balance Test   Sit to Stand  Able to stand  independently using hands    Standing Unsupported  Able to stand 2 minutes with supervision    Sitting with Back Unsupported but Feet Supported on Floor or Stool  Able to sit safely and securely 2 minutes    Stand to Sit  Sits safely with minimal use of hands    Transfers  Able to transfer safely, definite need of hands    Standing Unsupported with Eyes Closed  Able to stand 10 seconds safely    Standing Ubsupported with Feet Together  Able to place feet together independently and stand for 1 minute with supervision  From Standing, Reach Forward with Outstretched Arm  Can reach forward >12 cm safely (5")    From Standing Position, Pick up Object from Floor  Able to pick up shoe, needs supervision    From Standing Position, Turn to Look Behind Over each Shoulder  Looks behind one side only/other side shows less weight shift    Turn 360 Degrees  Needs close supervision or verbal cueing    Standing Unsupported, Alternately Place Feet on Step/Stool  Needs assistance to keep from falling or unable to try    Standing Unsupported, One Foot in [employer] balance while stepping or standing    Standing on One Leg  Unable to try or needs assist to prevent fall    Total Score  34        Timed Up and Go Test  TUG Normal TUG  Normal TUG (seconds) 90   TUG Comments Patient performed with RW and use of hands to stand from standard height chair            Objective measurements completed on examination: See above findings.              PT Education - 10/06/18 1621    Education Details  Discussed examination findings and plan of care.     Person(s) Educated  Patient    Methods  Explanation    Comprehension  Verbalized understanding       PT Short Term Goals - 10/06/18 1542      PT SHORT TERM GOAL #1   Title  Patient will demonstrate understanding and report regular compliance with HEP to improve strength, balance and functional mobility.     Time  3    Period  Weeks    Status  New    Target Date  10/27/18      PT SHORT TERM GOAL #2   Title  Patient will report ability to ambulate for at least 20 minutes in order to go to the grocery store and pick out food without frequent rest breaks.    Time  3    Period  Weeks    Status  New    Target Date  10/27/18        PT Long Term Goals - 10/06/18 1546      PT LONG TERM GOAL #1   Title  Patient will demonstrate improvement of at least 1/2 MMT strength grade in all deficient hip and knee muscles in order to improve stability and balance and decrease patient's risk of falls.     Time  6    Period  Weeks    Status  New    Target Date  11/17/18      PT LONG TERM GOAL #2   Title  Patient will demonstrate improvement of at least 10 seconds on the TUG indicating improved functional mobility and balance.     Time  6    Period  Weeks    Status  New    Target Date  11/17/18      PT LONG TERM GOAL #3   Title  Patient will demonstrate improvement of at least 7 points on the [facility] Scale indicating improved balance, safety, and decreased risk of falls.     Time  6    Period  Weeks    Status  New    Target Date  11/17/18             Plan - 10/06/18 1633  Clinical Impression Statement  Patient is a 78 year old female who presented to physical  therapy following a basal ganglia infarction on 07/06/18. Patient reported primary deficits in walking and balance as well as right wrist pain. Upon physical therapy evaluation patient demonstrated decreased ambulation and balance with the TUG and on the Berg Balance Scale. In addition, patient demonstrated decreased lower extremity strength in bilateral lower extremities. Discussed with patient the examination findings and plan of care. Also discussed with the patient that the therapist would send a referral to the patient's referring provider for an occupational therapy evaluation and patient gave her consent to this. Patient would benefit from skilled physical therapy in order to address the abovementioned deficits and help patient return to her prior level of function.     History and Personal Factors relevant to plan of care:  Stroke on 07/06/18 with administration of TPA per chart review; History of previous TIAs/strokes and history of seizures, DMII, CAD, Carotid artery stenosis    Clinical Presentation  Stable    Clinical Presentation due to:  MMT, TUG, Berg    Clinical Decision Making  Moderate    Rehab Potential  Good    Clinical Impairments Affecting Rehab Potential  Positive: Motivated; Negative: Length of time since patient's stroke    PT Frequency  3x / week    PT Duration  6 weeks    PT Treatment/Interventions  ADLs/Self Care Home Management;Aquatic Therapy;DME Instruction;Gait training;Stair training;Functional mobility training;Therapeutic activities;Therapeutic exercise;Balance training;Neuromuscular re-education;Patient/family education;Orthotic Fit/Training;Wheelchair mobility training;Manual techniques;Passive range of motion;Energy conservation;Taping    PT Next Visit Plan  Review evaluation goals, Initiate HEP including: Bridges, seated marching, SLR. Focus on hip and knee strengthening and balance training. Monitor patient's blood pressure as needed.     PT Home Exercise Plan   Initiate at first session    Recommended Other Services  Occupational Therapy Referral     Consulted and Agree with Plan of Care  Patient       Patient will benefit from skilled therapeutic intervention in order to improve the following deficits and impairments:  Abnormal gait, Decreased balance, Decreased endurance, Decreased mobility, Difficulty walking, Improper body mechanics, Decreased activity tolerance, Decreased strength, Postural dysfunction  Visit Diagnosis: Other symptoms and signs involving the nervous system  Muscle weakness (generalized)  Unsteadiness on feet  Other abnormalities of gait and mobility     Problem List Patient Active Problem List   Diagnosis Date Noted  . Tobacco use disorder 07/09/2018  . Small vessel disease ([place]) 07/09/2018  . Benign essential HTN   . Seizures ([place])   . Tachypnea   . Dysphagia, post-stroke   . Dysarthria 05/19/2018  . Syncope and collapse   . Syncope 08/24/2017  . Closed left femoral fracture ([place]) 04/21/2017  . Seizure disorder ([place]) 04/21/2017  . Closed pertrochanteric fracture of femur, left, initial encounter ([place]) 03/02/2017  . Seizure ([place]) 02/28/2017  . Hip fracture ([place]) 02/28/2017  . Chest pain 12/07/2016  . Atypical chest pain 12/07/2016  . Tachycardia   . NSTEMI (non-ST elevated myocardial infarction) ([place]) 11/02/2016  . Multifocal atrial tachycardia ([place]) 09/25/2016  . Elevated troponin 09/24/2016  . Hypokalemia 09/23/2016  . TIA (transient ischemic attack) 05/05/2016  . Intractable nausea and vomiting 10/15/2015  . Acute coronary syndrome ([place]) 06/01/2015  . Bronchitis 06/01/2015  . Thrombocytopenia ([place]) 06/01/2015  . Cerebral infarction ([place]) right occipital due to small vessel disease s/p tPA 03/06/2014  . Lower urinary tract infectious disease   . PSVT (  paroxysmal supraventricular tachycardia) ([place]) 12/09/2013  . Esophageal varices ([place]) 03/29/2013  . Hematemesis 03/29/2013  . Hepatic cirrhosis  ([place]) 03/29/2013  . Presence of stent in right coronary artery 07/04/2011  . OSA (obstructive sleep apnea) 07/04/2011  . Aortic sclerosis 07/04/2011  . Aortic insufficiency 07/04/2011  . HTN (hypertension) 07/04/2011  . Carotid stenosis, bilateral 07/04/2011  . Chest pain at rest 07/03/2011  . CAD (coronary artery disease) 07/03/2011  . Hypercholesteremia 07/03/2011  . GERD (gastroesophageal reflux disease) 07/03/2011   Clarene Critchley PT, DPT 4:45 PM, 10/06/18 [place], [place], [address] Phone: [phone]   Fax:  [phone]  Name: [name] MRN: [id number] Date of Birth: 1941/05/19

## 2018-10-08 ENCOUNTER — Encounter (HOSPITAL_COMMUNITY): Payer: Self-pay

## 2018-10-08 ENCOUNTER — Ambulatory Visit (HOSPITAL_COMMUNITY): Payer: Medicare Other

## 2018-10-08 VITALS — BP 155/62

## 2018-10-08 DIAGNOSIS — M6281 Muscle weakness (generalized): Secondary | ICD-10-CM | POA: Diagnosis not present

## 2018-10-08 DIAGNOSIS — R2689 Other abnormalities of gait and mobility: Secondary | ICD-10-CM

## 2018-10-08 DIAGNOSIS — R29818 Other symptoms and signs involving the nervous system: Secondary | ICD-10-CM | POA: Diagnosis not present

## 2018-10-08 DIAGNOSIS — R2681 Unsteadiness on feet: Secondary | ICD-10-CM

## 2018-10-08 NOTE — Patient Instructions (Signed)
Marching    Alternate lifting knees as high as is comfortable, as if marching. Repeat 10 times each leg. Do 2  sessions per day. Note: If possible place feet on floor.  Copyright  VHI. All rights reserved.   Long [employer]    Straighten operated leg and try to hold it 5 seconds.  Repeat 20 times. Do 1-2 sessions a day.  http://gt2.exer.us/311   Copyright  VHI. All rights reserved.   Bridge    Lie back, legs bent. Inhale, pressing hips up. Keeping ribs in, lengthen lower back. Exhale, rolling down along spine from top. Repeat 10 times. Do 2 sessions per day.  http://pm.exer.us/55   Copyright  VHI. All rights reserved.   Straight Leg Raise    Tighten stomach and slowly raise locked right leg ____ inches from floor. Repeat 10 times per set. Do 1-2 sets per session. Do 2 sessions per day.  http://orth.exer.us/1103   Copyright  VHI. All rights reserved.

## 2018-10-08 NOTE — Therapy (Signed)
[facility], [place], [address] Phone: [phone]   Fax:  [phone]  Physical Therapy Treatment  Patient Details  Name: [name] MRN: [id number] Date of Birth: [birth year] Referring Provider (PT): Letta Pate Luanna Salk, MD   Encounter Date: 10/08/2018  PT End of Session - 10/08/18 1143    Visit Number  2    Number of Visits  19    Date for PT Re-Evaluation  11/17/18   Minireassess 10/27/18   Authorization Type  [employer] Medicare    Authorization Time Period  10/06/18 - 11/17/18    Authorization - Visit Number  2    Authorization - Number of Visits  10    PT Start Time  1130   pt late for apt   PT Stop Time  1205    PT Time Calculation (min)  35 min    Equipment Utilized During Treatment  Gait belt    Activity Tolerance  Patient tolerated treatment well;No increased pain    Behavior During Therapy  WFL for tasks assessed/performed       Past Medical History:  Diagnosis Date  . Anemia   . Aortic insufficiency    Moderate  . Back pain   . Carotid stenosis, bilateral   . Cirrhosis ([place])   . Closed left femoral fracture ([place]) 04/21/2017  . Coronary artery disease    Stent x 2 RCA 1995, cardiac catheterization 09/2016 showing only mild atherosclerosis  . DDD (degenerative disc disease), lumbar   . Essential hypertension   . Falls   . GERD (gastroesophageal reflux disease)   . Hiatal hernia   . History of stroke   . Hypercholesteremia   . IBS (irritable bowel syndrome)   . Non-alcoholic fatty liver disease   . OSA (obstructive sleep apnea)    no CPAP  . Pericardial effusion    a. small by echo 2018.  Marland Kitchen PSVT (paroxysmal supraventricular tachycardia) ([place])   . Recurrent UTI   . Seizures ([place])   . Stroke ([place])   . Type 2 diabetes mellitus ([place])    off medication  . Varices, esophageal ([place])     Past Surgical History:  Procedure Laterality Date  . [place]  . APPENDECTOMY    . BACK  SURGERY  1985  . CARDIAC CATHETERIZATION  [phone]   Stent to the proximal RCA after MI   . CARDIAC CATHETERIZATION N/A 09/26/2016   Procedure: Left Heart Cath and Coronary Angiography;  Surgeon: Leonie Man, MD;  Location: [facility];  Service: Cardiovascular;  Laterality: N/A;  . CATARACT EXTRACTION W/PHACO Right 07/04/2014   Procedure: CATARACT EXTRACTION PHACO AND INTRAOCULAR LENS PLACEMENT (IOC);  Surgeon: Elta Guadeloupe T. Gershon Crane, MD;  Location: [facility];  Service: Ophthalmology;  Laterality: Right;  CDE:13.13  . COLONOSCOPY    . DILATION AND CURETTAGE OF UTERUS     x2  . Epi Retinal Membrane Peel Left   . ERCP    . ESOPHAGEAL BANDING N/A 04/01/2013   Procedure: ESOPHAGEAL BANDING;  Surgeon: Rogene Houston, MD;  [facility];  Service: Endoscopy;  Laterality: N/A;  . ESOPHAGEAL BANDING N/A 05/24/2013   Procedure: ESOPHAGEAL BANDING;  Surgeon: Rogene Houston, MD;  [facility];  Service: Endoscopy;  Laterality: N/A;  . ESOPHAGEAL BANDING N/A 06/21/2014   Procedure: ESOPHAGEAL BANDING;  Surgeon: Rogene Houston, MD;  [facility];  Service: Endoscopy;  Laterality: N/A;  .  ESOPHAGOGASTRODUODENOSCOPY N/A 04/01/2013   Procedure: ESOPHAGOGASTRODUODENOSCOPY (EGD);  Surgeon: Rogene Houston, MD;  [facility];  Service: Endoscopy;  Laterality: N/A;  230-rescheduled to 8:30am Ann notified pt  . ESOPHAGOGASTRODUODENOSCOPY N/A 05/24/2013   Procedure: ESOPHAGOGASTRODUODENOSCOPY (EGD);  Surgeon: Rogene Houston, MD;  [facility];  Service: Endoscopy;  Laterality: N/A;  730  . ESOPHAGOGASTRODUODENOSCOPY N/A 06/21/2014   Procedure: ESOPHAGOGASTRODUODENOSCOPY (EGD);  Surgeon: Rogene Houston, MD;  [facility];  Service: Endoscopy;  Laterality: N/A;  930-rescheduled 10/14 @ 1200 Ann to notify pt  . EYE SURGERY  08   cataract surgery of the left eye  . FEMUR IM NAIL Left 03/02/2017   Procedure: INTRAMEDULLARY (IM) NAIL FEMORAL;   Surgeon: Rod Can, MD;  Location: [facility];  Service: Orthopedics;  Laterality: Left;  . HARDWARE REMOVAL Right 01/17/2013   Procedure: REMOVAL OF HARDWARE AND EXCISION ULNAR STYLOID RIGHT WRIST;  Surgeon: Tennis Must, MD;  Location: [facility];  Service: Orthopedics;  Laterality: Right;  . MYRINGOTOMY  2012   both ears  . ORIF FEMUR FRACTURE Left 04/23/2017   Procedure: OPEN REDUCTION INTERNAL FIXATION (ORIF) DISTAL FEMUR FRACTURE (FRACTURE AROUND FEMORAL NAIL);  Surgeon: Altamese Wheeler, MD;  Location: [facility];  Service: Orthopedics;  Laterality: Left;  . TONSILLECTOMY    . VAGINAL HYSTERECTOMY  1972  . WRIST SURGERY     rt wrist hardwear removal    Vitals:   10/08/18 1152  BP: (!) 155/62    Subjective Assessment - 10/08/18 1135    Subjective  Pt stated she is feeling good today, no reports of pain or recent fall.     Pertinent History  Stroke on 07/06/18 with administration of TPA per chart review; History of previous TIAs/strokes and history of seizures, DMII, CAD, Carotid artery stenosis    Patient Stated Goals  Walk a little better    Currently in Pain?  No/denies                       [facility] Adult PT Treatment/Exercise - 10/08/18 0001      Exercises   Exercises  Knee/Hip      Knee/Hip Exercises: Seated   Long Arc Quad  Both;10 reps;2 sets    Marching  Both;2 sets;10 reps    Marching Limitations  cueing for posture and control with movement      Knee/Hip Exercises: Supine   Bridges  10 reps    Straight Leg Raises  10 reps             PT Education - 10/08/18 1200    Education Details  Reviewed goals and established HEP.  Discussed attendance.  Discussed community brain injury support group    Person(s) Educated  Patient    Methods  Explanation;Demonstration;Handout    Comprehension  Verbalized understanding;Returned demonstration       PT Short Term Goals - 10/06/18 1542      PT SHORT TERM GOAL #1   Title  Patient will  demonstrate understanding and report regular compliance with HEP to improve strength, balance and functional mobility.     Time  3    Period  Weeks    Status  New    Target Date  10/27/18      PT SHORT TERM GOAL #2   Title  Patient will report ability to ambulate for at least 20 minutes in order to go to the grocery store and pick  out food without frequent rest breaks.    Time  3    Period  Weeks    Status  New    Target Date  10/27/18        PT Long Term Goals - 10/06/18 1546      PT LONG TERM GOAL #1   Title  Patient will demonstrate improvement of at least 1/2 MMT strength grade in all deficient hip and knee muscles in order to improve stability and balance and decrease patient's risk of falls.     Time  6    Period  Weeks    Status  New    Target Date  11/17/18      PT LONG TERM GOAL #2   Title  Patient will demonstrate improvement of at least 10 seconds on the TUG indicating improved functional mobility and balance.     Time  6    Period  Weeks    Status  New    Target Date  11/17/18      PT LONG TERM GOAL #3   Title  Patient will demonstrate improvement of at least 7 points on the [facility] Scale indicating improved balance, safety, and decreased risk of falls.     Time  6    Period  Weeks    Status  New    Target Date  11/17/18            Plan - 10/08/18 1212    Clinical Impression Statement  Reviewed goals.  Established HEP wiht focus on LE and hip strengthening.  Pt able to demonstrate and verbalize all exercises correctly, main cueing to slow down for control wiht task.  No reports of pain through session, was slightly fatigued wiht exercises.  Discussed brain injury support group wihtin the community, pt interested in attending.      Rehab Potential  Good    Clinical Impairments Affecting Rehab Potential  Positive: Motivated; Negative: Length of time since patient's stroke    PT Frequency  3x / week    PT Duration  6 weeks    PT  Treatment/Interventions  ADLs/Self Care Home Management;Aquatic Therapy;DME Instruction;Gait training;Stair training;Functional mobility training;Therapeutic activities;Therapeutic exercise;Balance training;Neuromuscular re-education;Patient/family education;Orthotic Fit/Training;Wheelchair mobility training;Manual techniques;Passive range of motion;Energy conservation;Taping    PT Next Visit Plan  Review compliance with HEP.  Begin abduction and STS next session. Focus on hip and knee strengthening and balance training. Monitor patient's blood pressure as needed.     PT Home Exercise Plan  10/08/18: Constance Haw, seated marching, SLR, LAQ       Patient will benefit from skilled therapeutic intervention in order to improve the following deficits and impairments:  Abnormal gait, Decreased balance, Decreased endurance, Decreased mobility, Difficulty walking, Improper body mechanics, Decreased activity tolerance, Decreased strength, Postural dysfunction  Visit Diagnosis: Other symptoms and signs involving the nervous system  Muscle weakness (generalized)  Unsteadiness on feet  Other abnormalities of gait and mobility     Problem List Patient Active Problem List   Diagnosis Date Noted  . Tobacco use disorder 07/09/2018  . Small vessel disease ([place]) 07/09/2018  . Benign essential HTN   . Seizures ([place])   . Tachypnea   . Dysphagia, post-stroke   . Dysarthria 05/19/2018  . Syncope and collapse   . Syncope 08/24/2017  . Closed left femoral fracture ([place]) 04/21/2017  . Seizure disorder ([place]) 04/21/2017  . Closed pertrochanteric fracture of femur, left, initial encounter ([place]) 03/02/2017  . Seizure ([place]) 02/28/2017  .  Hip fracture ([place]) 02/28/2017  . Chest pain 12/07/2016  . Atypical chest pain 12/07/2016  . Tachycardia   . NSTEMI (non-ST elevated myocardial infarction) ([place]) 11/02/2016  . Multifocal atrial tachycardia ([place]) 09/25/2016  . Elevated troponin 09/24/2016  . Hypokalemia  09/23/2016  . TIA (transient ischemic attack) 05/05/2016  . Intractable nausea and vomiting 10/15/2015  . Acute coronary syndrome ([place]) 06/01/2015  . Bronchitis 06/01/2015  . Thrombocytopenia ([place]) 06/01/2015  . Cerebral infarction ([place]) right occipital due to small vessel disease s/p tPA 03/06/2014  . Lower urinary tract infectious disease   . PSVT (paroxysmal supraventricular tachycardia) ([place]) 12/09/2013  . Esophageal varices ([place]) 03/29/2013  . Hematemesis 03/29/2013  . Hepatic cirrhosis ([place]) 03/29/2013  . Presence of stent in right coronary artery 07/04/2011  . OSA (obstructive sleep apnea) 07/04/2011  . Aortic sclerosis 07/04/2011  . Aortic insufficiency 07/04/2011  . HTN (hypertension) 07/04/2011  . Carotid stenosis, bilateral 07/04/2011  . Chest pain at rest 07/03/2011  . CAD (coronary artery disease) 07/03/2011  . Hypercholesteremia 07/03/2011  . GERD (gastroesophageal reflux disease) 07/03/2011   Ihor Austin, [place]; [place]  Aldona Lento 10/08/2018, 12:18 PM  [facility] [address] [place], [place], [address] Phone: [phone]   Fax:  [phone]  Name: [name] MRN: [id number] Date of Birth: May 02, 1941

## 2018-10-11 ENCOUNTER — Ambulatory Visit (HOSPITAL_COMMUNITY): Payer: Medicare Other | Attending: Physical Medicine & Rehabilitation | Admitting: Physical Therapy

## 2018-10-11 DIAGNOSIS — M6281 Muscle weakness (generalized): Secondary | ICD-10-CM | POA: Insufficient documentation

## 2018-10-11 DIAGNOSIS — M25631 Stiffness of right wrist, not elsewhere classified: Secondary | ICD-10-CM | POA: Diagnosis not present

## 2018-10-11 DIAGNOSIS — R2681 Unsteadiness on feet: Secondary | ICD-10-CM | POA: Insufficient documentation

## 2018-10-11 DIAGNOSIS — R29818 Other symptoms and signs involving the nervous system: Secondary | ICD-10-CM | POA: Diagnosis not present

## 2018-10-11 DIAGNOSIS — R29898 Other symptoms and signs involving the musculoskeletal system: Secondary | ICD-10-CM | POA: Diagnosis not present

## 2018-10-11 DIAGNOSIS — M25531 Pain in right wrist: Secondary | ICD-10-CM | POA: Insufficient documentation

## 2018-10-11 DIAGNOSIS — R2689 Other abnormalities of gait and mobility: Secondary | ICD-10-CM | POA: Insufficient documentation

## 2018-10-11 NOTE — Therapy (Signed)
[facility] [place], [place], [address] Phone: [phone]   Fax:  [phone]  Physical Therapy Treatment  Patient Details  Name: [name] MRN: [id number] Date of Birth: 18-Nov-1940 Referring Provider (PT): Letta Pate Luanna Salk, MD   Encounter Date: 10/11/2018  PT End of Session - 10/11/18 1114    Visit Number  3    Number of Visits  19    Date for PT Re-Evaluation  11/17/18   Minireassess 10/27/18   Authorization Type  [employer] Medicare    Authorization Time Period  10/06/18 - 11/17/18    Authorization - Visit Number  3    Authorization - Number of Visits  10    PT Start Time  7902    PT Stop Time  1117    PT Time Calculation (min)  41 min    Equipment Utilized During Treatment  Gait belt    Activity Tolerance  Patient tolerated treatment well;No increased pain    Behavior During Therapy  WFL for tasks assessed/performed       Past Medical History:  Diagnosis Date  . Anemia   . Aortic insufficiency    Moderate  . Back pain   . Carotid stenosis, bilateral   . Cirrhosis ([place])   . Closed left femoral fracture ([place]) 04/21/2017  . Coronary artery disease    Stent x 2 RCA 1995, cardiac catheterization 09/2016 showing only mild atherosclerosis  . DDD (degenerative disc disease), lumbar   . Essential hypertension   . Falls   . GERD (gastroesophageal reflux disease)   . Hiatal hernia   . History of stroke   . Hypercholesteremia   . IBS (irritable bowel syndrome)   . Non-alcoholic fatty liver disease   . OSA (obstructive sleep apnea)    no CPAP  . Pericardial effusion    a. small by echo 2018.  Marland Kitchen PSVT (paroxysmal supraventricular tachycardia) ([place])   . Recurrent UTI   . Seizures ([place])   . Stroke ([place])   . Type 2 diabetes mellitus ([place])    off medication  . Varices, esophageal ([place])     Past Surgical History:  Procedure Laterality Date  . [place]  . APPENDECTOMY    . BACK SURGERY  1985  .  CARDIAC CATHETERIZATION  [phone]   Stent to the proximal RCA after MI   . CARDIAC CATHETERIZATION N/A 09/26/2016   Procedure: Left Heart Cath and Coronary Angiography;  Surgeon: Leonie Man, MD;  Location: [facility];  Service: Cardiovascular;  Laterality: N/A;  . CATARACT EXTRACTION W/PHACO Right 07/04/2014   Procedure: CATARACT EXTRACTION PHACO AND INTRAOCULAR LENS PLACEMENT (IOC);  Surgeon: Elta Guadeloupe T. Gershon Crane, MD;  [facility];  Service: Ophthalmology;  Laterality: Right;  CDE:13.13  . COLONOSCOPY    . DILATION AND CURETTAGE OF UTERUS     x2  . Epi Retinal Membrane Peel Left   . ERCP    . ESOPHAGEAL BANDING N/A 04/01/2013   Procedure: ESOPHAGEAL BANDING;  Surgeon: Rogene Houston, MD;  [facility];  Service: Endoscopy;  Laterality: N/A;  . ESOPHAGEAL BANDING N/A 05/24/2013   Procedure: ESOPHAGEAL BANDING;  Surgeon: Rogene Houston, MD;  [facility];  Service: Endoscopy;  Laterality: N/A;  . ESOPHAGEAL BANDING N/A 06/21/2014   Procedure: ESOPHAGEAL BANDING;  Surgeon: Rogene Houston, MD;  [facility];  Service: Endoscopy;  Laterality: N/A;  . ESOPHAGOGASTRODUODENOSCOPY N/A 04/01/2013  Procedure: ESOPHAGOGASTRODUODENOSCOPY (EGD);  Surgeon: Rogene Houston, MD;  [facility];  Service: Endoscopy;  Laterality: N/A;  230-rescheduled to 8:30am Ann notified pt  . ESOPHAGOGASTRODUODENOSCOPY N/A 05/24/2013   Procedure: ESOPHAGOGASTRODUODENOSCOPY (EGD);  Surgeon: Rogene Houston, MD;  [facility];  Service: Endoscopy;  Laterality: N/A;  730  . ESOPHAGOGASTRODUODENOSCOPY N/A 06/21/2014   Procedure: ESOPHAGOGASTRODUODENOSCOPY (EGD);  Surgeon: Rogene Houston, MD;  [facility];  Service: Endoscopy;  Laterality: N/A;  930-rescheduled 10/14 @ 1200 Ann to notify pt  . EYE SURGERY  08   cataract surgery of the left eye  . FEMUR IM NAIL Left 03/02/2017   Procedure: INTRAMEDULLARY (IM) NAIL FEMORAL;  Surgeon: Rod Can, MD;  Location: [facility];  Service: Orthopedics;  Laterality: Left;  . HARDWARE REMOVAL Right 01/17/2013   Procedure: REMOVAL OF HARDWARE AND EXCISION ULNAR STYLOID RIGHT WRIST;  Surgeon: Tennis Must, MD;  Location: [facility];  Service: Orthopedics;  Laterality: Right;  . MYRINGOTOMY  2012   both ears  . ORIF FEMUR FRACTURE Left 04/23/2017   Procedure: OPEN REDUCTION INTERNAL FIXATION (ORIF) DISTAL FEMUR FRACTURE (FRACTURE AROUND FEMORAL NAIL);  Surgeon: Altamese Joffre, MD;  Location: [facility];  Service: Orthopedics;  Laterality: Left;  . TONSILLECTOMY    . VAGINAL HYSTERECTOMY  1972  . WRIST SURGERY     rt wrist hardwear removal    There were no vitals filed for this visit.  Subjective Assessment - 10/11/18 1053    Subjective  Pt states she is doing well today without any issues.  Comes in wheelchair, bringing walker.  Received with AFO on Lt foot.     Currently in Pain?  No/denies                       [facility] Adult PT Treatment/Exercise - 10/11/18 0001      Ambulation/Gait   Ambulation/Gait  Yes    Ambulation/Gait Assistance  6: Modified independent (Device/Increase time)    Ambulation Distance (Feet)  200 Feet    Assistive device  Rolling walker    Gait Pattern  Decreased step length - left;Decreased step length - right;Decreased stride length;Poor foot clearance - left    Ambulation Surface  Level;Indoor      Knee/Hip Exercises: Standing   Heel Raises  Both;10 reps    Knee Flexion  Both;10 reps    Forward Lunges  Both;10 reps;Limitations    Forward Lunges Limitations  onto 4" box with 1 HHA    Hip Abduction  Both;10 reps    Hip Extension  Both;10 reps    SLS with Vectors  5X3" each LE with 1 HHA    Other Standing Knee Exercises  tandem stance 30" each      Knee/Hip Exercises: Supine   Bridges  2 sets;10 reps    Bridges Limitations  legs even    Straight Leg Raises  10 reps;2 sets               PT Short Term Goals - 10/06/18  1542      PT SHORT TERM GOAL #1   Title  Patient will demonstrate understanding and report regular compliance with HEP to improve strength, balance and functional mobility.     Time  3    Period  Weeks    Status  New    Target Date  10/27/18      PT SHORT TERM GOAL #2  Title  Patient will report ability to ambulate for at least 20 minutes in order to go to the grocery store and pick out food without frequent rest breaks.    Time  3    Period  Weeks    Status  New    Target Date  10/27/18        PT Long Term Goals - 10/06/18 1546      PT LONG TERM GOAL #1   Title  Patient will demonstrate improvement of at least 1/2 MMT strength grade in all deficient hip and knee muscles in order to improve stability and balance and decrease patient's risk of falls.     Time  6    Period  Weeks    Status  New    Target Date  11/17/18      PT LONG TERM GOAL #2   Title  Patient will demonstrate improvement of at least 10 seconds on the TUG indicating improved functional mobility and balance.     Time  6    Period  Weeks    Status  New    Target Date  11/17/18      PT LONG TERM GOAL #3   Title  Patient will demonstrate improvement of at least 7 points on the [facility] Scale indicating improved balance, safety, and decreased risk of falls.     Time  6    Period  Weeks    Status  New    Target Date  11/17/18            Plan - 10/11/18 1114    Clinical Impression Statement  continued with LE strengthening, stability and improvement in gait.  Pt able to increase distance without fatigue with ambulation today.  Began standing strengthening and stability exercises.  unable to complete single leg on either LE without bilateral UE assist.  Reviewed supine exercises today to ensure correct form with minor cues needed.  Overall improving.  Instructed to come with walker only next session, to discontinue use of wheelchair.      Rehab Potential  Good    Clinical Impairments Affecting Rehab  Potential  Positive: Motivated; Negative: Length of time since patient's stroke    PT Frequency  3x / week    PT Duration  6 weeks    PT Treatment/Interventions  ADLs/Self Care Home Management;Aquatic Therapy;DME Instruction;Gait training;Stair training;Functional mobility training;Therapeutic activities;Therapeutic exercise;Balance training;Neuromuscular re-education;Patient/family education;Orthotic Fit/Training;Wheelchair mobility training;Manual techniques;Passive range of motion;Energy conservation;Taping    PT Next Visit Plan  Next session begin STS.  Progress to ability to complete SLS vectors with 1 UE only.  Monitor patient's blood pressure as needed.     PT Home Exercise Plan  10/08/18: Constance Haw, seated marching, SLR, LAQ       Patient will benefit from skilled therapeutic intervention in order to improve the following deficits and impairments:  Abnormal gait, Decreased balance, Decreased endurance, Decreased mobility, Difficulty walking, Improper body mechanics, Decreased activity tolerance, Decreased strength, Postural dysfunction  Visit Diagnosis: Other symptoms and signs involving the nervous system  Muscle weakness (generalized)  Unsteadiness on feet     Problem List Patient Active Problem List   Diagnosis Date Noted  . Tobacco use disorder 07/09/2018  . Small vessel disease ([place]) 07/09/2018  . Benign essential HTN   . Seizures ([place])   . Tachypnea   . Dysphagia, post-stroke   . Dysarthria 05/19/2018  . Syncope and collapse   . Syncope 08/24/2017  . Closed left  femoral fracture ([place]) 04/21/2017  . Seizure disorder ([place]) 04/21/2017  . Closed pertrochanteric fracture of femur, left, initial encounter ([place]) 03/02/2017  . Seizure ([place]) 02/28/2017  . Hip fracture ([place]) 02/28/2017  . Chest pain 12/07/2016  . Atypical chest pain 12/07/2016  . Tachycardia   . NSTEMI (non-ST elevated myocardial infarction) ([place]) 11/02/2016  . Multifocal atrial tachycardia ([place]) 09/25/2016   . Elevated troponin 09/24/2016  . Hypokalemia 09/23/2016  . TIA (transient ischemic attack) 05/05/2016  . Intractable nausea and vomiting 10/15/2015  . Acute coronary syndrome ([place]) 06/01/2015  . Bronchitis 06/01/2015  . Thrombocytopenia ([place]) 06/01/2015  . Cerebral infarction ([place]) right occipital due to small vessel disease s/p tPA 03/06/2014  . Lower urinary tract infectious disease   . PSVT (paroxysmal supraventricular tachycardia) ([place]) 12/09/2013  . Esophageal varices ([place]) 03/29/2013  . Hematemesis 03/29/2013  . Hepatic cirrhosis ([place]) 03/29/2013  . Presence of stent in right coronary artery 07/04/2011  . OSA (obstructive sleep apnea) 07/04/2011  . Aortic sclerosis 07/04/2011  . Aortic insufficiency 07/04/2011  . HTN (hypertension) 07/04/2011  . Carotid stenosis, bilateral 07/04/2011  . Chest pain at rest 07/03/2011  . CAD (coronary artery disease) 07/03/2011  . Hypercholesteremia 07/03/2011  . GERD (gastroesophageal reflux disease) 07/03/2011   Teena Irani, PTA/CLT [phone]  Teena Irani 10/11/2018, 11:18 AM  [facility] [address] [place], [place], [address] Phone: [phone]   Fax:  [phone]  Name: [name] MRN: [id number] Date of Birth: [birth year]

## 2018-10-13 ENCOUNTER — Ambulatory Visit (HOSPITAL_COMMUNITY): Payer: Medicare Other | Admitting: Physical Therapy

## 2018-10-13 DIAGNOSIS — R29818 Other symptoms and signs involving the nervous system: Secondary | ICD-10-CM | POA: Diagnosis not present

## 2018-10-13 DIAGNOSIS — R2689 Other abnormalities of gait and mobility: Secondary | ICD-10-CM | POA: Diagnosis not present

## 2018-10-13 DIAGNOSIS — R2681 Unsteadiness on feet: Secondary | ICD-10-CM

## 2018-10-13 DIAGNOSIS — R29898 Other symptoms and signs involving the musculoskeletal system: Secondary | ICD-10-CM | POA: Diagnosis not present

## 2018-10-13 DIAGNOSIS — M25531 Pain in right wrist: Secondary | ICD-10-CM | POA: Diagnosis not present

## 2018-10-13 DIAGNOSIS — M6281 Muscle weakness (generalized): Secondary | ICD-10-CM | POA: Diagnosis not present

## 2018-10-13 DIAGNOSIS — M25631 Stiffness of right wrist, not elsewhere classified: Secondary | ICD-10-CM | POA: Diagnosis not present

## 2018-10-13 NOTE — Therapy (Signed)
[facility] [place], [place], [address] Phone: [phone]   Fax:  [phone]  Physical Therapy Treatment  Patient Details  Name: [name] MRN: [id number] Date of Birth: 11/15/1940 Referring Provider (PT): Letta Pate Luanna Salk, MD   Encounter Date: 10/13/2018  PT End of Session - 10/13/18 1529    Visit Number  4    Number of Visits  19    Date for PT Re-Evaluation  11/17/18   Minireassess 10/27/18   Authorization Type  U[employer] Medicare    Authorization Time Period  10/06/18 - 11/17/18    Authorization - Visit Number  4    Authorization - Number of Visits  10    PT Start Time  1120    PT Stop Time  1202    PT Time Calculation (min)  42 min    Equipment Utilized During Treatment  Gait belt    Activity Tolerance  Patient tolerated treatment well;No increased pain    Behavior During Therapy  WFL for tasks assessed/performed       Past Medical History:  Diagnosis Date  . Anemia   . Aortic insufficiency    Moderate  . Back pain   . Carotid stenosis, bilateral   . Cirrhosis ([place])   . Closed left femoral fracture ([place]) 04/21/2017  . Coronary artery disease    Stent x 2 RCA 1995, cardiac catheterization 09/2016 showing only mild atherosclerosis  . DDD (degenerative disc disease), lumbar   . Essential hypertension   . Falls   . GERD (gastroesophageal reflux disease)   . Hiatal hernia   . History of stroke   . Hypercholesteremia   . IBS (irritable bowel syndrome)   . Non-alcoholic fatty liver disease   . OSA (obstructive sleep apnea)    no CPAP  . Pericardial effusion    a. small by echo 2018.  Marland Kitchen PSVT (paroxysmal supraventricular tachycardia) ([place])   . Recurrent UTI   . Seizures ([place])   . Stroke ([place])   . Type 2 diabetes mellitus ([place])    off medication  . Varices, esophageal ([place])     Past Surgical History:  Procedure Laterality Date  . [place]  . APPENDECTOMY    . BACK SURGERY  1985  .  CARDIAC CATHETERIZATION  [phone]   Stent to the proximal RCA after MI   . CARDIAC CATHETERIZATION N/A 09/26/2016   Procedure: Left Heart Cath and Coronary Angiography;  Surgeon: Leonie Man, MD;  Location: [facility];  Service: Cardiovascular;  Laterality: N/A;  . CATARACT EXTRACTION W/PHACO Right 07/04/2014   Procedure: CATARACT EXTRACTION PHACO AND INTRAOCULAR LENS PLACEMENT (IOC);  Surgeon: Elta Guadeloupe T. Gershon Crane, MD;  Location: [facility];  Service: Ophthalmology;  Laterality: Right;  CDE:13.13  . COLONOSCOPY    . DILATION AND CURETTAGE OF UTERUS     x2  . Epi Retinal Membrane Peel Left   . ERCP    . ESOPHAGEAL BANDING N/A 04/01/2013   Procedure: ESOPHAGEAL BANDING;  Surgeon: Rogene Houston, MD;  [facility];  Service: Endoscopy;  Laterality: N/A;  . ESOPHAGEAL BANDING N/A 05/24/2013   Procedure: ESOPHAGEAL BANDING;  Surgeon: Rogene Houston, MD;  [facility];  Service: Endoscopy;  Laterality: N/A;  . ESOPHAGEAL BANDING N/A 06/21/2014   Procedure: ESOPHAGEAL BANDING;  Surgeon: Rogene Houston, MD;  [facility];  Service: Endoscopy;  Laterality: N/A;  . ESOPHAGOGASTRODUODENOSCOPY N/A 04/01/2013  Procedure: ESOPHAGOGASTRODUODENOSCOPY (EGD);  Surgeon: Rogene Houston, MD;  [facility];  Service: Endoscopy;  Laterality: N/A;  230-rescheduled to 8:30am Ann notified pt  . ESOPHAGOGASTRODUODENOSCOPY N/A 05/24/2013   Procedure: ESOPHAGOGASTRODUODENOSCOPY (EGD);  Surgeon: Rogene Houston, MD;  [facility];  Service: Endoscopy;  Laterality: N/A;  730  . ESOPHAGOGASTRODUODENOSCOPY N/A 06/21/2014   Procedure: ESOPHAGOGASTRODUODENOSCOPY (EGD);  Surgeon: Rogene Houston, MD;  [facility];  Service: Endoscopy;  Laterality: N/A;  930-rescheduled 10/14 @ 1200 Ann to notify pt  . EYE SURGERY  08   cataract surgery of the left eye  . FEMUR IM NAIL Left 03/02/2017   Procedure: INTRAMEDULLARY (IM) NAIL FEMORAL;  Surgeon: Rod Can, MD;  Location: [facility];  Service: Orthopedics;  Laterality: Left;  . HARDWARE REMOVAL Right 01/17/2013   Procedure: REMOVAL OF HARDWARE AND EXCISION ULNAR STYLOID RIGHT WRIST;  Surgeon: Tennis Must, MD;  Location: [facility];  Service: Orthopedics;  Laterality: Right;  . MYRINGOTOMY  2012   both ears  . ORIF FEMUR FRACTURE Left 04/23/2017   Procedure: OPEN REDUCTION INTERNAL FIXATION (ORIF) DISTAL FEMUR FRACTURE (FRACTURE AROUND FEMORAL NAIL);  Surgeon: Altamese Hester, MD;  Location: [facility];  Service: Orthopedics;  Laterality: Left;  . TONSILLECTOMY    . VAGINAL HYSTERECTOMY  1972  . WRIST SURGERY     rt wrist hardwear removal    There were no vitals filed for this visit.  Subjective Assessment - 10/13/18 1122    Subjective  pt states she had alot of pain when she first got up yesterday morning bilateral balls of her feet, none today.  No pain or complaints with good compliance with exercises.      Currently in Pain?  No/denies                       [facility] Adult PT Treatment/Exercise - 10/13/18 0001      Ambulation/Gait   Ambulation/Gait  Yes    Ambulation/Gait Assistance  6: Modified independent (Device/Increase time)    Ambulation Distance (Feet)  200 Feet    Assistive device  Rolling walker    Gait Pattern  Decreased step length - left;Decreased step length - right;Decreased stride length;Poor foot clearance - left      Knee/Hip Exercises: Standing   Heel Raises  Both;10 reps    Knee Flexion  Both;10 reps    Forward Lunges  Both;10 reps;Limitations    Forward Lunges Limitations  onto 4" box with 1 HHA    Hip Abduction  Both;15 reps    Hip Extension  Both;15 reps    Lateral Step Up  Both;10 reps;Hand Hold: 1;Step Height: 4"    Forward Step Up  Both;10 reps;Hand Hold: 1;Step Height: 4"    SLS with Vectors  5X3" each LE with 1 HHA    Other Standing Knee Exercises  tandem stance 20-30"X2 each      Knee/Hip Exercises: Seated   Sit to Sand  5  reps;2 sets;without UE support   5 reps Rt back, 5 reps Lt back further              PT Short Term Goals - 10/06/18 1542      PT SHORT TERM GOAL #1   Title  Patient will demonstrate understanding and report regular compliance with HEP to improve strength, balance and functional mobility.     Time  3    Period  Weeks  Status  New    Target Date  10/27/18      PT SHORT TERM GOAL #2   Title  Patient will report ability to ambulate for at least 20 minutes in order to go to the grocery store and pick out food without frequent rest breaks.    Time  3    Period  Weeks    Status  New    Target Date  10/27/18        PT Long Term Goals - 10/06/18 1546      PT LONG TERM GOAL #1   Title  Patient will demonstrate improvement of at least 1/2 MMT strength grade in all deficient hip and knee muscles in order to improve stability and balance and decrease patient's risk of falls.     Time  6    Period  Weeks    Status  New    Target Date  11/17/18      PT LONG TERM GOAL #2   Title  Patient will demonstrate improvement of at least 10 seconds on the TUG indicating improved functional mobility and balance.     Time  6    Period  Weeks    Status  New    Target Date  11/17/18      PT LONG TERM GOAL #3   Title  Patient will demonstrate improvement of at least 7 points on the [facility] Scale indicating improved balance, safety, and decreased risk of falls.     Time  6    Period  Weeks    Status  New    Target Date  11/17/18            Plan - 10/13/18 1525    Clinical Impression Statement  contiued with focus on improving LE functional strength and overall gait quality.  Pt able to increase ambulation distance this session with cues for increasing stride. Completed STS from standard chair without UE assist and added forward/lateral step ups with cues for posturing and form.     Rehab Potential  Good    Clinical Impairments Affecting Rehab Potential  Positive: Motivated;  Negative: Length of time since patient's stroke    PT Frequency  3x / week    PT Duration  6 weeks    PT Treatment/Interventions  ADLs/Self Care Home Management;Aquatic Therapy;DME Instruction;Gait training;Stair training;Functional mobility training;Therapeutic activities;Therapeutic exercise;Balance training;Neuromuscular re-education;Patient/family education;Orthotic Fit/Training;Wheelchair mobility training;Manual techniques;Passive range of motion;Energy conservation;Taping    PT Next Visit Plan  Progress to ability to complete SLS vectors with 1 UE only.  Monitor patient's blood pressure as needed.     PT Home Exercise Plan  10/08/18: Constance Haw, seated marching, SLR, LAQ       Patient will benefit from skilled therapeutic intervention in order to improve the following deficits and impairments:  Abnormal gait, Decreased balance, Decreased endurance, Decreased mobility, Difficulty walking, Improper body mechanics, Decreased activity tolerance, Decreased strength, Postural dysfunction  Visit Diagnosis: Other symptoms and signs involving the nervous system  Muscle weakness (generalized)  Unsteadiness on feet     Problem List Patient Active Problem List   Diagnosis Date Noted  . Tobacco use disorder 07/09/2018  . Small vessel disease ([place]) 07/09/2018  . Benign essential HTN   . Seizures ([place])   . Tachypnea   . Dysphagia, post-stroke   . Dysarthria 05/19/2018  . Syncope and collapse   . Syncope 08/24/2017  . Closed left femoral fracture ([place]) 04/21/2017  . Seizure disorder ([place])  04/21/2017  . Closed pertrochanteric fracture of femur, left, initial encounter ([place]) 03/02/2017  . Seizure ([place]) 02/28/2017  . Hip fracture ([place]) 02/28/2017  . Chest pain 12/07/2016  . Atypical chest pain 12/07/2016  . Tachycardia   . NSTEMI (non-ST elevated myocardial infarction) ([place]) 11/02/2016  . Multifocal atrial tachycardia ([place]) 09/25/2016  . Elevated troponin 09/24/2016  . Hypokalemia  09/23/2016  . TIA (transient ischemic attack) 05/05/2016  . Intractable nausea and vomiting 10/15/2015  . Acute coronary syndrome ([place]) 06/01/2015  . Bronchitis 06/01/2015  . Thrombocytopenia ([place]) 06/01/2015  . Cerebral infarction ([place]) right occipital due to small vessel disease s/p tPA 03/06/2014  . Lower urinary tract infectious disease   . PSVT (paroxysmal supraventricular tachycardia) ([place]) 12/09/2013  . Esophageal varices ([place]) 03/29/2013  . Hematemesis 03/29/2013  . Hepatic cirrhosis ([place]) 03/29/2013  . Presence of stent in right coronary artery 07/04/2011  . OSA (obstructive sleep apnea) 07/04/2011  . Aortic sclerosis 07/04/2011  . Aortic insufficiency 07/04/2011  . HTN (hypertension) 07/04/2011  . Carotid stenosis, bilateral 07/04/2011  . Chest pain at rest 07/03/2011  . CAD (coronary artery disease) 07/03/2011  . Hypercholesteremia 07/03/2011  . GERD (gastroesophageal reflux disease) 07/03/2011   Teena Irani, PTA/CLT [phone]  Teena Irani 10/13/2018, 3:29 PM  [facility] [address] [place], [place], [address] Phone: [phone]   Fax:  [phone]  Name: [name] MRN: [id number] Date of Birth: 04-Dec-1940

## 2018-10-15 ENCOUNTER — Encounter (HOSPITAL_COMMUNITY): Payer: Self-pay

## 2018-10-15 ENCOUNTER — Ambulatory Visit (HOSPITAL_COMMUNITY): Payer: Medicare Other

## 2018-10-15 DIAGNOSIS — M25631 Stiffness of right wrist, not elsewhere classified: Secondary | ICD-10-CM | POA: Diagnosis not present

## 2018-10-15 DIAGNOSIS — R29898 Other symptoms and signs involving the musculoskeletal system: Secondary | ICD-10-CM | POA: Diagnosis not present

## 2018-10-15 DIAGNOSIS — M6281 Muscle weakness (generalized): Secondary | ICD-10-CM | POA: Diagnosis not present

## 2018-10-15 DIAGNOSIS — R2689 Other abnormalities of gait and mobility: Secondary | ICD-10-CM | POA: Diagnosis not present

## 2018-10-15 DIAGNOSIS — R29818 Other symptoms and signs involving the nervous system: Secondary | ICD-10-CM | POA: Diagnosis not present

## 2018-10-15 DIAGNOSIS — M25531 Pain in right wrist: Secondary | ICD-10-CM | POA: Diagnosis not present

## 2018-10-15 DIAGNOSIS — R2681 Unsteadiness on feet: Secondary | ICD-10-CM

## 2018-10-15 NOTE — Therapy (Signed)
[facility] [address] [place], [place], [address] Phone: [phone]   Fax:  [phone]  Physical Therapy Treatment  Patient Details  Name: [name] MRN: [id number] Date of Birth: Nov 30, 1940 Referring Provider (PT): Letta Pate Luanna Salk, MD   Encounter Date: 10/15/2018  PT End of Session - 10/15/18 1143    Visit Number  5    Number of Visits  19    Date for PT Re-Evaluation  11/17/18   Minireassess 10/27/18   Authorization Type  UHC Medicare    Authorization Time Period  10/06/18 - 11/17/18    Authorization - Visit Number  5    Authorization - Number of Visits  10    PT Start Time  0388   pt late for apt    PT Stop Time  1202    PT Time Calculation (min)  25 min    Equipment Utilized During Treatment  Gait belt    Activity Tolerance  Patient tolerated treatment well;No increased pain    Behavior During Therapy  WFL for tasks assessed/performed       Past Medical History:  Diagnosis Date  . Anemia   . Aortic insufficiency    Moderate  . Back pain   . Carotid stenosis, bilateral   . Cirrhosis ([place])   . Closed left femoral fracture ([place]) 04/21/2017  . Coronary artery disease    Stent x 2 RCA 1995, cardiac catheterization 09/2016 showing only mild atherosclerosis  . DDD (degenerative disc disease), lumbar   . Essential hypertension   . Falls   . GERD (gastroesophageal reflux disease)   . Hiatal hernia   . History of stroke   . Hypercholesteremia   . IBS (irritable bowel syndrome)   . Non-alcoholic fatty liver disease   . OSA (obstructive sleep apnea)    no CPAP  . Pericardial effusion    a. small by echo 2018.  Marland Kitchen PSVT (paroxysmal supraventricular tachycardia) ([place])   . Recurrent UTI   . Seizures ([place])   . Stroke ([place])   . Type 2 diabetes mellitus ([place])    off medication  . Varices, esophageal ([place])     Past Surgical History:  Procedure Laterality Date  . [place]  . APPENDECTOMY    . BACK  SURGERY  1985  . CARDIAC CATHETERIZATION  [phone]   Stent to the proximal RCA after MI   . CARDIAC CATHETERIZATION N/A 09/26/2016   Procedure: Left Heart Cath and Coronary Angiography;  Surgeon: Leonie Man, MD;  Location: [facility];  Service: Cardiovascular;  Laterality: N/A;  . CATARACT EXTRACTION W/PHACO Right 07/04/2014   Procedure: CATARACT EXTRACTION PHACO AND INTRAOCULAR LENS PLACEMENT (IOC);  Surgeon: Elta Guadeloupe T. Gershon Crane, MD;  [facility]: [facility];  Service: Ophthalmology;  Laterality: Right;  CDE:13.13  . COLONOSCOPY    . DILATION AND CURETTAGE OF UTERUS     x2  . Epi Retinal Membrane Peel Left   . ERCP    . ESOPHAGEAL BANDING N/A 04/01/2013   Procedure: ESOPHAGEAL BANDING;  Surgeon: Rogene Houston, MD;  [facility];  Service: Endoscopy;  Laterality: N/A;  . ESOPHAGEAL BANDING N/A 05/24/2013   Procedure: ESOPHAGEAL BANDING;  Surgeon: Rogene Houston, MD;  [facility];  Service: Endoscopy;  Laterality: N/A;  . ESOPHAGEAL BANDING N/A 06/21/2014   Procedure: ESOPHAGEAL BANDING;  Surgeon: Rogene Houston, MD;  [facility];  Service: Endoscopy;  Laterality: N/A;  .  ESOPHAGOGASTRODUODENOSCOPY N/A 04/01/2013   Procedure: ESOPHAGOGASTRODUODENOSCOPY (EGD);  Surgeon: Rogene Houston, MD;  [facility];  Service: Endoscopy;  Laterality: N/A;  230-rescheduled to 8:30am Ann notified pt  . ESOPHAGOGASTRODUODENOSCOPY N/A 05/24/2013   Procedure: ESOPHAGOGASTRODUODENOSCOPY (EGD);  Surgeon: Rogene Houston, MD;  [facility];  Service: Endoscopy;  Laterality: N/A;  730  . ESOPHAGOGASTRODUODENOSCOPY N/A 06/21/2014   Procedure: ESOPHAGOGASTRODUODENOSCOPY (EGD);  Surgeon: Rogene Houston, MD;  [facility];  Service: Endoscopy;  Laterality: N/A;  930-rescheduled 10/14 @ 1200 Ann to notify pt  . EYE SURGERY  08   cataract surgery of the left eye  . FEMUR IM NAIL Left 03/02/2017   Procedure: INTRAMEDULLARY (IM) NAIL FEMORAL;   Surgeon: Rod Can, MD;  Location: [facility];  Service: Orthopedics;  Laterality: Left;  . HARDWARE REMOVAL Right 01/17/2013   Procedure: REMOVAL OF HARDWARE AND EXCISION ULNAR STYLOID RIGHT WRIST;  Surgeon: Tennis Must, MD;  Location: [facility];  Service: Orthopedics;  Laterality: Right;  . MYRINGOTOMY  2012   both ears  . ORIF FEMUR FRACTURE Left 04/23/2017   Procedure: OPEN REDUCTION INTERNAL FIXATION (ORIF) DISTAL FEMUR FRACTURE (FRACTURE AROUND FEMORAL NAIL);  Surgeon: Altamese Roselle, MD;  Location: [facility];  Service: Orthopedics;  Laterality: Left;  . TONSILLECTOMY    . VAGINAL HYSTERECTOMY  1972  . WRIST SURGERY     rt wrist hardwear removal    There were no vitals filed for this visit.  Subjective Assessment - 10/15/18 1140    Subjective  Pt stated she is moving slow this morning, feels weak.  No reports of pain    Pertinent History  Stroke on 07/06/18 with administration of TPA per chart review; History of previous TIAs/strokes and history of seizures, DMII, CAD, Carotid artery stenosis    Patient Stated Goals  Walk a little better    Currently in Pain?  No/denies                       [facility] Adult PT Treatment/Exercise - 10/15/18 0001      Knee/Hip Exercises: Standing   Heel Raises  Both;10 reps    Heel Raises Limitations  Toe raises    Functional Squat  10 reps    SLS with Vectors  5X3" each LE with 1 HHA    Other Standing Knee Exercises  tandem stance 20-30"X2 each      Knee/Hip Exercises: Seated   Sit to Sand  5 reps;2 sets;without UE support               PT Short Term Goals - 10/06/18 1542      PT SHORT TERM GOAL #1   Title  Patient will demonstrate understanding and report regular compliance with HEP to improve strength, balance and functional mobility.     Time  3    Period  Weeks    Status  New    Target Date  10/27/18      PT SHORT TERM GOAL #2   Title  Patient will report ability to ambulate for at least 20  minutes in order to go to the grocery store and pick out food without frequent rest breaks.    Time  3    Period  Weeks    Status  New    Target Date  10/27/18        PT Long Term Goals - 10/06/18 1546  PT LONG TERM GOAL #1   Title  Patient will demonstrate improvement of at least 1/2 MMT strength grade in all deficient hip and knee muscles in order to improve stability and balance and decrease patient's risk of falls.     Time  6    Period  Weeks    Status  New    Target Date  11/17/18      PT LONG TERM GOAL #2   Title  Patient will demonstrate improvement of at least 10 seconds on the TUG indicating improved functional mobility and balance.     Time  6    Period  Weeks    Status  New    Target Date  11/17/18      PT LONG TERM GOAL #3   Title  Patient will demonstrate improvement of at least 7 points on the [facility] Scale indicating improved balance, safety, and decreased risk of falls.     Time  6    Period  Weeks    Status  New    Target Date  11/17/18            Plan - 10/15/18 1706    Clinical Impression Statement  Pt late for apt, unable to complete full POC.  Continued session focus wiht functional strengthening and balance training.  Therapist facilitation to improve form with majority of tasks, especially for posture and to look up during balance.  Pt continues to have difficulty with SLS BLE, required HHA or to lower foot per LOB episodes with task.  Improved stability noted with partial tandem stance and ability to hold for 30" without HHA or LOB.      Rehab Potential  Good    Clinical Impairments Affecting Rehab Potential  Positive: Motivated; Negative: Length of time since patient's stroke    PT Frequency  3x / week    PT Duration  6 weeks    PT Treatment/Interventions  ADLs/Self Care Home Management;Aquatic Therapy;DME Instruction;Gait training;Stair training;Functional mobility training;Therapeutic activities;Therapeutic exercise;Balance  training;Neuromuscular re-education;Patient/family education;Orthotic Fit/Training;Wheelchair mobility training;Manual techniques;Passive range of motion;Energy conservation;Taping    PT Next Visit Plan  Progress to ability to complete SLS vectors with 1 UE only.  Monitor patient's blood pressure as needed.     PT Home Exercise Plan  10/08/18: Constance Haw, seated marching, SLR, LAQ       Patient will benefit from skilled therapeutic intervention in order to improve the following deficits and impairments:  Abnormal gait, Decreased balance, Decreased endurance, Decreased mobility, Difficulty walking, Improper body mechanics, Decreased activity tolerance, Decreased strength, Postural dysfunction  Visit Diagnosis: Other symptoms and signs involving the nervous system  Muscle weakness (generalized)  Unsteadiness on feet  Other abnormalities of gait and mobility     Problem List Patient Active Problem List   Diagnosis Date Noted  . Tobacco use disorder 07/09/2018  . Small vessel disease ([place]) 07/09/2018  . Benign essential HTN   . Seizures ([place])   . Tachypnea   . Dysphagia, post-stroke   . Dysarthria 05/19/2018  . Syncope and collapse   . Syncope 08/24/2017  . Closed left femoral fracture ([place]) 04/21/2017  . Seizure disorder ([place]) 04/21/2017  . Closed pertrochanteric fracture of femur, left, initial encounter ([place]) 03/02/2017  . Seizure ([place]) 02/28/2017  . Hip fracture ([place]) 02/28/2017  . Chest pain 12/07/2016  . Atypical chest pain 12/07/2016  . Tachycardia   . NSTEMI (non-ST elevated myocardial infarction) ([place]) 11/02/2016  . Multifocal atrial tachycardia ([place]) 09/25/2016  .  Elevated troponin 09/24/2016  . Hypokalemia 09/23/2016  . TIA (transient ischemic attack) 05/05/2016  . Intractable nausea and vomiting 10/15/2015  . Acute coronary syndrome ([place]) 06/01/2015  . Bronchitis 06/01/2015  . Thrombocytopenia ([place]) 06/01/2015  . Cerebral infarction ([place]) right occipital due to  small vessel disease s/p tPA 03/06/2014  . Lower urinary tract infectious disease   . PSVT (paroxysmal supraventricular tachycardia) ([place]) 12/09/2013  . Esophageal varices ([place]) 03/29/2013  . Hematemesis 03/29/2013  . Hepatic cirrhosis ([place]) 03/29/2013  . Presence of stent in right coronary artery 07/04/2011  . OSA (obstructive sleep apnea) 07/04/2011  . Aortic sclerosis 07/04/2011  . Aortic insufficiency 07/04/2011  . HTN (hypertension) 07/04/2011  . Carotid stenosis, bilateral 07/04/2011  . Chest pain at rest 07/03/2011  . CAD (coronary artery disease) 07/03/2011  . Hypercholesteremia 07/03/2011  . GERD (gastroesophageal reflux disease) 07/03/2011   Ihor Austin, [place]; [place]  Aldona Lento 10/15/2018, 5:10 PM  [facility] [place], [place], [address] Phone: [phone]   Fax:  [phone]  Name: [name] MRN: [id number] Date of Birth: 1941/08/02

## 2018-10-18 ENCOUNTER — Ambulatory Visit (HOSPITAL_COMMUNITY): Payer: Medicare Other | Admitting: Physical Therapy

## 2018-10-18 DIAGNOSIS — R29818 Other symptoms and signs involving the nervous system: Secondary | ICD-10-CM

## 2018-10-18 DIAGNOSIS — R2681 Unsteadiness on feet: Secondary | ICD-10-CM | POA: Diagnosis not present

## 2018-10-18 DIAGNOSIS — R2689 Other abnormalities of gait and mobility: Secondary | ICD-10-CM

## 2018-10-18 DIAGNOSIS — M25631 Stiffness of right wrist, not elsewhere classified: Secondary | ICD-10-CM | POA: Diagnosis not present

## 2018-10-18 DIAGNOSIS — M25531 Pain in right wrist: Secondary | ICD-10-CM | POA: Diagnosis not present

## 2018-10-18 DIAGNOSIS — R29898 Other symptoms and signs involving the musculoskeletal system: Secondary | ICD-10-CM | POA: Diagnosis not present

## 2018-10-18 DIAGNOSIS — M6281 Muscle weakness (generalized): Secondary | ICD-10-CM | POA: Diagnosis not present

## 2018-10-18 NOTE — Therapy (Signed)
[place], [place], [address] Phone: [phone]   Fax:  [phone]  Physical Therapy Treatment  Patient Details  Name: [name] MRN: [id number] Date of Birth: 01-Apr-1941 Referring Provider (PT): Letta Pate Luanna Salk, MD   Encounter Date: 10/18/2018  PT End of Session - 10/18/18 1018    Visit Number  6    Number of Visits  19    Date for PT Re-Evaluation  11/17/18   Minireassess 10/27/18   Authorization Type  [employer] Medicare    Authorization Time Period  10/06/18 - 11/17/18    Authorization - Visit Number  6    Authorization - Number of Visits  10    PT Start Time  0950    PT Stop Time  1035    PT Time Calculation (min)  45 min    Equipment Utilized During Treatment  Gait belt    Activity Tolerance  Patient tolerated treatment well;No increased pain    Behavior During Therapy  WFL for tasks assessed/performed       Past Medical History:  Diagnosis Date  . Anemia   . Aortic insufficiency    Moderate  . Back pain   . Carotid stenosis, bilateral   . Cirrhosis ([place])   . Closed left femoral fracture ([place]) 04/21/2017  . Coronary artery disease    Stent x 2 RCA 1995, cardiac catheterization 09/2016 showing only mild atherosclerosis  . DDD (degenerative disc disease), lumbar   . Essential hypertension   . Falls   . GERD (gastroesophageal reflux disease)   . Hiatal hernia   . History of stroke   . Hypercholesteremia   . IBS (irritable bowel syndrome)   . Non-alcoholic fatty liver disease   . OSA (obstructive sleep apnea)    no CPAP  . Pericardial effusion    a. small by echo 2018.  Marland Kitchen PSVT (paroxysmal supraventricular tachycardia) ([place])   . Recurrent UTI   . Seizures ([place])   . Stroke ([place])   . Type 2 diabetes mellitus ([place])    off medication  . Varices, esophageal ([place])     Past Surgical History:  Procedure Laterality Date  . [place]  . APPENDECTOMY    . BACK SURGERY  1985  .  CARDIAC CATHETERIZATION  [phone]   Stent to the proximal RCA after MI   . CARDIAC CATHETERIZATION N/A 09/26/2016   Procedure: Left Heart Cath and Coronary Angiography;  Surgeon: Leonie Man, MD;  Location: [facility];  Service: Cardiovascular;  Laterality: N/A;  . CATARACT EXTRACTION W/PHACO Right 07/04/2014   Procedure: CATARACT EXTRACTION PHACO AND INTRAOCULAR LENS PLACEMENT (IOC);  Surgeon: Elta Guadeloupe T. Gershon Crane, MD;  Location: [facility];  Service: Ophthalmology;  Laterality: Right;  CDE:13.13  . COLONOSCOPY    . DILATION AND CURETTAGE OF UTERUS     x2  . Epi Retinal Membrane Peel Left   . ERCP    . ESOPHAGEAL BANDING N/A 04/01/2013   Procedure: ESOPHAGEAL BANDING;  Surgeon: Rogene Houston, MD;  [facility];  Service: Endoscopy;  Laterality: N/A;  . ESOPHAGEAL BANDING N/A 05/24/2013   Procedure: ESOPHAGEAL BANDING;  Surgeon: Rogene Houston, MD;  [facility];  Service: Endoscopy;  Laterality: N/A;  . ESOPHAGEAL BANDING N/A 06/21/2014   Procedure: ESOPHAGEAL BANDING;  Surgeon: Rogene Houston, MD;  [facility];  Service: Endoscopy;  Laterality: N/A;  . ESOPHAGOGASTRODUODENOSCOPY N/A 04/01/2013  Procedure: ESOPHAGOGASTRODUODENOSCOPY (EGD);  Surgeon: Rogene Houston, MD;  [facility];  Service: Endoscopy;  Laterality: N/A;  230-rescheduled to 8:30am Ann notified pt  . ESOPHAGOGASTRODUODENOSCOPY N/A 05/24/2013   Procedure: ESOPHAGOGASTRODUODENOSCOPY (EGD);  Surgeon: Rogene Houston, MD;  [facility];  Service: Endoscopy;  Laterality: N/A;  730  . ESOPHAGOGASTRODUODENOSCOPY N/A 06/21/2014   Procedure: ESOPHAGOGASTRODUODENOSCOPY (EGD);  Surgeon: Rogene Houston, MD;  [facility];  Service: Endoscopy;  Laterality: N/A;  930-rescheduled 10/14 @ 1200 Ann to notify pt  . EYE SURGERY  08   cataract surgery of the left eye  . FEMUR IM NAIL Left 03/02/2017   Procedure: INTRAMEDULLARY (IM) NAIL FEMORAL;  Surgeon: Rod Can, MD;  Location: [facility];  Service: Orthopedics;  Laterality: Left;  . HARDWARE REMOVAL Right 01/17/2013   Procedure: REMOVAL OF HARDWARE AND EXCISION ULNAR STYLOID RIGHT WRIST;  Surgeon: Tennis Must, MD;  Location: [facility];  Service: Orthopedics;  Laterality: Right;  . MYRINGOTOMY  2012   both ears  . ORIF FEMUR FRACTURE Left 04/23/2017   Procedure: OPEN REDUCTION INTERNAL FIXATION (ORIF) DISTAL FEMUR FRACTURE (FRACTURE AROUND FEMORAL NAIL);  Surgeon: Altamese Crum, MD;  Location: [facility];  Service: Orthopedics;  Laterality: Left;  . TONSILLECTOMY    . VAGINAL HYSTERECTOMY  1972  . WRIST SURGERY     rt wrist hardwear removal    There were no vitals filed for this visit.  Subjective Assessment - 10/18/18 0952    Subjective  PT states that she feels that she is getting worse rather than better,  States that she does not feel that she is walking as well and she is now having increased pain in her hip and knees and it hurts to turn over in the bed.      Pertinent History  Stroke on 07/06/18 with administration of TPA per chart review; History of previous TIAs/strokes and history of seizures, DMII, CAD, Carotid artery stenosis    Limitations  Standing;Walking    How long can you sit comfortably?  Not limited    How long can you stand comfortably?  15-20 minutes    How long can you walk comfortably?  10 minutes    Diagnostic tests  MRI 07/07/18: "punctate new white matter infarct in the anterior right occipital" "No acute intracranial abnormality" See imaging for further details    Patient Stated Goals  Walk a little better    Currently in Pain?  Yes    Pain Score  4     Pain Location  Hip    Pain Orientation  Right    Pain Descriptors / Indicators  Discomfort    Pain Type  Acute pain    Pain Onset  In the past 7 days    Pain Frequency  Intermittent    Aggravating Factors   moving     Pain Relieving Factors  rest    Effect of Pain on Daily Activities  limits     Multiple Pain Sites  Yes                       [facility] Adult PT Treatment/Exercise - 10/18/18 0001      Bed Mobility   Bed Mobility  Rolling Right;Rolling Left;Supine to Sit;Sit to Supine    Rolling Right  Supervision/verbal cueing    Rolling Left  Supervision/Verbal cueing    Supine to Sit  Supervision/Verbal cueing    Sit  to Supine  Supervision/Verbal cueing      Ambulation/Gait   Ambulation/Gait  Yes    Ambulation Distance (Feet)  200 Feet    Gait Comments  Working on keeping head up, shoulders back and even steps, not stopping walker       Exercises   Exercises  Knee/Hip      Knee/Hip Exercises: Seated   Long Arc Quad  Left;10 reps;Weights    Long Arc Quad Weight  5 lbs.    Sit to [employer]  10 reps      Knee/Hip Exercises: Supine   Bridges  2 sets;10 reps    Other Supine Knee/Hip Exercises  Abduction x 10       Knee/Hip Exercises: Prone   Hamstring Curl  10 reps    Hamstring Curl Limitations  3    Hip Extension  10 reps    Hip Extension Limitations  AAROM                PT Short Term Goals - 10/18/18 1036      PT SHORT TERM GOAL #1   Title  Patient will demonstrate understanding and report regular compliance with HEP to improve strength, balance and functional mobility.     Time  3    Period  Weeks    Status  On-going      PT SHORT TERM GOAL #2   Title  Patient will report ability to ambulate for at least 20 minutes in order to go to the grocery store and pick out food without frequent rest breaks.    Time  3    Period  Weeks    Status  On-going        PT Long Term Goals - 10/18/18 1036      PT LONG TERM GOAL #1   Title  Patient will demonstrate improvement of at least 1/2 MMT strength grade in all deficient hip and knee muscles in order to improve stability and balance and decrease patient's risk of falls.     Time  6    Period  Weeks    Status  New      PT LONG TERM GOAL #2   Title  Patient will demonstrate improvement of at least  10 seconds on the TUG indicating improved functional mobility and balance.     Time  6    Period  Weeks    Status  On-going      PT LONG TERM GOAL #3   Title  Patient will demonstrate improvement of at least 7 points on the [facility] Balance Scale indicating improved balance, safety, and decreased risk of falls.     Time  6    Period  Weeks    Status  On-going            Plan - 10/18/18 1019    Clinical Impression Statement  PT complaining of difficulty with bed mobility therefore started session working on bed mobility.  Pt Main weakness is in hip abductor and extensor with abductors at a 2/5 and extensors at 2+/5.  Explained to pt that this could very well be the reason that she is having hip and knee pain now as the mm are not supporting the jts as they were prior to the stroke       Rehab Potential  Good    Clinical Impairments Affecting Rehab Potential  Positive: Motivated; Negative: Length of time since patient's stroke    PT Frequency  3x /  week    PT Duration  6 weeks    PT Treatment/Interventions  ADLs/Self Care Home Management;Aquatic Therapy;DME Instruction;Gait training;Stair training;Functional mobility training;Therapeutic activities;Therapeutic exercise;Balance training;Neuromuscular re-education;Patient/family education;Orthotic Fit/Training;Wheelchair mobility training;Manual techniques;Passive range of motion;Energy conservation;Taping    PT Next Visit Plan  Focus on LT hip abductor and extensor strength, gt as well as balance.     PT Home Exercise Plan  10/08/18: Constance Haw, seated marching, SLR, LAQ    Consulted and Agree with Plan of Care  Patient       Patient will benefit from skilled therapeutic intervention in order to improve the following deficits and impairments:  Abnormal gait, Decreased balance, Decreased endurance, Decreased mobility, Difficulty walking, Improper body mechanics, Decreased activity tolerance, Decreased strength, Postural dysfunction  Visit  Diagnosis: Other symptoms and signs involving the nervous system  Muscle weakness (generalized)  Unsteadiness on feet  Other abnormalities of gait and mobility     Problem List Patient Active Problem List   Diagnosis Date Noted  . Tobacco use disorder 07/09/2018  . Small vessel disease ([place]) 07/09/2018  . Benign essential HTN   . Seizures ([place])   . Tachypnea   . Dysphagia, post-stroke   . Dysarthria 05/19/2018  . Syncope and collapse   . Syncope 08/24/2017  . Closed left femoral fracture ([place]) 04/21/2017  . Seizure disorder ([place]) 04/21/2017  . Closed pertrochanteric fracture of femur, left, initial encounter ([place]) 03/02/2017  . Seizure ([place]) 02/28/2017  . Hip fracture ([place]) 02/28/2017  . Chest pain 12/07/2016  . Atypical chest pain 12/07/2016  . Tachycardia   . NSTEMI (non-ST elevated myocardial infarction) ([place]) 11/02/2016  . Multifocal atrial tachycardia ([place]) 09/25/2016  . Elevated troponin 09/24/2016  . Hypokalemia 09/23/2016  . TIA (transient ischemic attack) 05/05/2016  . Intractable nausea and vomiting 10/15/2015  . Acute coronary syndrome ([place]) 06/01/2015  . Bronchitis 06/01/2015  . Thrombocytopenia ([place]) 06/01/2015  . Cerebral infarction ([place]) right occipital due to small vessel disease s/p tPA 03/06/2014  . Lower urinary tract infectious disease   . PSVT (paroxysmal supraventricular tachycardia) ([place]) 12/09/2013  . Esophageal varices ([place]) 03/29/2013  . Hematemesis 03/29/2013  . Hepatic cirrhosis ([place]) 03/29/2013  . Presence of stent in right coronary artery 07/04/2011  . OSA (obstructive sleep apnea) 07/04/2011  . Aortic sclerosis 07/04/2011  . Aortic insufficiency 07/04/2011  . HTN (hypertension) 07/04/2011  . Carotid stenosis, bilateral 07/04/2011  . Chest pain at rest 07/03/2011  . CAD (coronary artery disease) 07/03/2011  . Hypercholesteremia 07/03/2011  . GERD (gastroesophageal reflux disease) 07/03/2011    Rayetta Humphrey, PT  CLT [phone] 10/18/2018, 10:37 AM  [facility] [address] [place], [place], [address] Phone: [phone]   Fax:  [phone]  Name: [name] MRN: [id number] Date of Birth: 10/04/1940

## 2018-10-20 ENCOUNTER — Ambulatory Visit (HOSPITAL_COMMUNITY): Payer: Medicare Other | Admitting: Physical Therapy

## 2018-10-20 ENCOUNTER — Encounter (HOSPITAL_COMMUNITY): Payer: Self-pay | Admitting: Physical Therapy

## 2018-10-20 DIAGNOSIS — R2689 Other abnormalities of gait and mobility: Secondary | ICD-10-CM | POA: Diagnosis not present

## 2018-10-20 DIAGNOSIS — M25531 Pain in right wrist: Secondary | ICD-10-CM | POA: Diagnosis not present

## 2018-10-20 DIAGNOSIS — R2681 Unsteadiness on feet: Secondary | ICD-10-CM

## 2018-10-20 DIAGNOSIS — M6281 Muscle weakness (generalized): Secondary | ICD-10-CM

## 2018-10-20 DIAGNOSIS — R29818 Other symptoms and signs involving the nervous system: Secondary | ICD-10-CM | POA: Diagnosis not present

## 2018-10-20 DIAGNOSIS — R29898 Other symptoms and signs involving the musculoskeletal system: Secondary | ICD-10-CM | POA: Diagnosis not present

## 2018-10-20 DIAGNOSIS — M25631 Stiffness of right wrist, not elsewhere classified: Secondary | ICD-10-CM | POA: Diagnosis not present

## 2018-10-20 NOTE — Therapy (Signed)
[place], [place], [address] Phone: [phone]   Fax:  [phone]  Physical Therapy Treatment  Patient Details  Name: [name] MRN: [id number] Date of Birth: Feb 14, 1941 Referring Provider (PT): Letta Pate Luanna Salk, MD   Encounter Date: 10/20/2018  PT End of Session - 10/20/18 1453    Visit Number  7    Number of Visits  19    Date for PT Re-Evaluation  11/17/18   Minireassess 10/27/18   Authorization Type  [employer] Medicare    Authorization Time Period  10/06/18 - 11/17/18    Authorization - Visit Number  7    Authorization - Number of Visits  10    PT Start Time  6073    PT Stop Time  1510    PT Time Calculation (min)  38 min    Equipment Utilized During Treatment  Gait belt    Activity Tolerance  Patient tolerated treatment well;No increased pain    Behavior During Therapy  WFL for tasks assessed/performed       Past Medical History:  Diagnosis Date  . Anemia   . Aortic insufficiency    Moderate  . Back pain   . Carotid stenosis, bilateral   . Cirrhosis ([place])   . Closed left femoral fracture ([place]) 04/21/2017  . Coronary artery disease    Stent x 2 RCA 1995, cardiac catheterization 09/2016 showing only mild atherosclerosis  . DDD (degenerative disc disease), lumbar   . Essential hypertension   . Falls   . GERD (gastroesophageal reflux disease)   . Hiatal hernia   . History of stroke   . Hypercholesteremia   . IBS (irritable bowel syndrome)   . Non-alcoholic fatty liver disease   . OSA (obstructive sleep apnea)    no CPAP  . Pericardial effusion    a. small by echo 2018.  Marland Kitchen PSVT (paroxysmal supraventricular tachycardia) ([place])   . Recurrent UTI   . Seizures ([place])   . Stroke ([place])   . Type 2 diabetes mellitus ([place])    off medication  . Varices, esophageal ([place])     Past Surgical History:  Procedure Laterality Date  . [place]  . APPENDECTOMY    . BACK SURGERY  1985  .  CARDIAC CATHETERIZATION  [phone]   Stent to the proximal RCA after MI   . CARDIAC CATHETERIZATION N/A 09/26/2016   Procedure: Left Heart Cath and Coronary Angiography;  Surgeon: Leonie Man, MD;  Location: [facility];  Service: Cardiovascular;  Laterality: N/A;  . CATARACT EXTRACTION W/PHACO Right 07/04/2014   Procedure: CATARACT EXTRACTION PHACO AND INTRAOCULAR LENS PLACEMENT (IOC);  Surgeon: Elta Guadeloupe T. Gershon Crane, MD;  Location: [facility];  Service: Ophthalmology;  Laterality: Right;  CDE:13.13  . COLONOSCOPY    . DILATION AND CURETTAGE OF UTERUS     x2  . Epi Retinal Membrane Peel Left   . ERCP    . ESOPHAGEAL BANDING N/A 04/01/2013   Procedure: ESOPHAGEAL BANDING;  Surgeon: Rogene Houston, MD;  [facility];  Service: Endoscopy;  Laterality: N/A;  . ESOPHAGEAL BANDING N/A 05/24/2013   Procedure: ESOPHAGEAL BANDING;  Surgeon: Rogene Houston, MD;  [facility];  Service: Endoscopy;  Laterality: N/A;  . ESOPHAGEAL BANDING N/A 06/21/2014   Procedure: ESOPHAGEAL BANDING;  Surgeon: Rogene Houston, MD;  [facility];  Service: Endoscopy;  Laterality: N/A;  . ESOPHAGOGASTRODUODENOSCOPY N/A 04/01/2013  Procedure: ESOPHAGOGASTRODUODENOSCOPY (EGD);  Surgeon: Rogene Houston, MD;  [facility];  Service: Endoscopy;  Laterality: N/A;  230-rescheduled to 8:30am Ann notified pt  . ESOPHAGOGASTRODUODENOSCOPY N/A 05/24/2013   Procedure: ESOPHAGOGASTRODUODENOSCOPY (EGD);  Surgeon: Rogene Houston, MD;  [facility];  Service: Endoscopy;  Laterality: N/A;  730  . ESOPHAGOGASTRODUODENOSCOPY N/A 06/21/2014   Procedure: ESOPHAGOGASTRODUODENOSCOPY (EGD);  Surgeon: Rogene Houston, MD;  [facility];  Service: Endoscopy;  Laterality: N/A;  930-rescheduled 10/14 @ 1200 Ann to notify pt  . EYE SURGERY  08   cataract surgery of the left eye  . FEMUR IM NAIL Left 03/02/2017   Procedure: INTRAMEDULLARY (IM) NAIL FEMORAL;  Surgeon: Rod Can, MD;  Location: [facility];  Service: Orthopedics;  Laterality: Left;  . HARDWARE REMOVAL Right 01/17/2013   Procedure: REMOVAL OF HARDWARE AND EXCISION ULNAR STYLOID RIGHT WRIST;  Surgeon: Tennis Must, MD;  Location: [facility];  Service: Orthopedics;  Laterality: Right;  . MYRINGOTOMY  2012   both ears  . ORIF FEMUR FRACTURE Left 04/23/2017   Procedure: OPEN REDUCTION INTERNAL FIXATION (ORIF) DISTAL FEMUR FRACTURE (FRACTURE AROUND FEMORAL NAIL);  Surgeon: Altamese Okeene, MD;  Location: [facility];  Service: Orthopedics;  Laterality: Left;  . TONSILLECTOMY    . VAGINAL HYSTERECTOMY  1972  . WRIST SURGERY     rt wrist hardwear removal    There were no vitals filed for this visit.  Subjective Assessment - 10/20/18 1446    Subjective  Patient reported she's not having any pain. She said she's been trying to practice walking with upright posture at home.     Pertinent History  Stroke on 07/06/18 with administration of TPA per chart review; History of previous TIAs/strokes and history of seizures, DMII, CAD, Carotid artery stenosis    Limitations  Standing;Walking    How long can you sit comfortably?  Not limited    How long can you stand comfortably?  15-20 minutes    How long can you walk comfortably?  10 minutes    Diagnostic tests  MRI 07/07/18: "punctate new white matter infarct in the anterior right occipital" "No acute intracranial abnormality" See imaging for further details    Patient Stated Goals  Walk a little better    Currently in Pain?  No/denies                       [facility] Adult PT Treatment/Exercise - 10/20/18 0001      Bed Mobility   Bed Mobility  Rolling Right;Rolling Left;Supine to Sit;Sit to Supine    Rolling Right  Supervision/verbal cueing    Rolling Left  Supervision/Verbal cueing    Supine to Sit  Supervision/Verbal cueing    Sit to Supine  Supervision/Verbal cueing      Ambulation/Gait   Ambulation/Gait  Yes    Ambulation Distance  (Feet)  220 Feet    Assistive device  Rolling walker    Gait Comments  Working on keeping head up, shoulders back and even steps, staying close to walker and not stopping walker       Exercises   Exercises  Knee/Hip      Knee/Hip Exercises: Standing   Functional Squat  10 reps    Functional Squat Limitations  Bilateral UE assistance      Knee/Hip Exercises: Seated   Long Arc Quad  Left;10 reps;Weights    Long Arc Quad Weight  5 lbs.  Sit to Sand  10 reps   From elevated mat 21.5 inches     Knee/Hip Exercises: Supine   Bridges  2 sets;10 reps    Other Supine Knee/Hip Exercises  Abduction 2 x 10       Knee/Hip Exercises: Prone   Hamstring Curl  10 reps    Hamstring Curl Limitations  3#    Hip Extension  10 reps    Hip Extension Limitations  AAROM              PT Education - 10/20/18 1452    Education Details  Gait training and purpose and technique of interventions.     Person(s) Educated  Patient    Methods  Explanation;Demonstration;Tactile cues    Comprehension  Verbalized understanding;Returned demonstration       PT Short Term Goals - 10/18/18 1036      PT SHORT TERM GOAL #1   Title  Patient will demonstrate understanding and report regular compliance with HEP to improve strength, balance and functional mobility.     Time  3    Period  Weeks    Status  On-going      PT SHORT TERM GOAL #2   Title  Patient will report ability to ambulate for at least 20 minutes in order to go to the grocery store and pick out food without frequent rest breaks.    Time  3    Period  Weeks    Status  On-going        PT Long Term Goals - 10/18/18 1036      PT LONG TERM GOAL #1   Title  Patient will demonstrate improvement of at least 1/2 MMT strength grade in all deficient hip and knee muscles in order to improve stability and balance and decrease patient's risk of falls.     Time  6    Period  Weeks    Status  New      PT LONG TERM GOAL #2   Title  Patient will  demonstrate improvement of at least 10 seconds on the TUG indicating improved functional mobility and balance.     Time  6    Period  Weeks    Status  On-going      PT LONG TERM GOAL #3   Title  Patient will demonstrate improvement of at least 7 points on the [facility] Balance Scale indicating improved balance, safety, and decreased risk of falls.     Time  6    Period  Weeks    Status  On-going            Plan - 10/20/18 1521    Clinical Impression Statement  This session continued with a focus on hip strengthening and gait training. This session patient performed sit to stands from 21.5 inch mat table without upper extremity assistance. Patient required frequent verbal and tactile cues throughout for proper form with exercises. Patient stated some discomfort in hip with supine hip abduction, likely due to overuse of hip flexors. Patient would benefit from continued skilled physical therapy in order to continue progressing towards functional goals.     Rehab Potential  Good    Clinical Impairments Affecting Rehab Potential  Positive: Motivated; Negative: Length of time since patient's stroke    PT Frequency  3x / week    PT Duration  6 weeks    PT Treatment/Interventions  ADLs/Self Care Home Management;Aquatic Therapy;DME Instruction;Gait training;Stair training;Functional mobility training;Therapeutic activities;Therapeutic exercise;Balance training;Neuromuscular  re-education;Patient/family education;Orthotic Fit/Training;Wheelchair mobility training;Manual techniques;Passive range of motion;Energy conservation;Taping    PT Next Visit Plan  Focus on LT hip abductor and extensor strength, gt as well as balance. Work on balance activites next session trial isometric clams or AROM clams next session.     PT Home Exercise Plan  10/08/18: Constance Haw, seated marching, SLR, LAQ    Consulted and Agree with Plan of Care  Patient       Patient will benefit from skilled therapeutic intervention in order  to improve the following deficits and impairments:  Abnormal gait, Decreased balance, Decreased endurance, Decreased mobility, Difficulty walking, Improper body mechanics, Decreased activity tolerance, Decreased strength, Postural dysfunction  Visit Diagnosis: Other symptoms and signs involving the nervous system  Muscle weakness (generalized)  Unsteadiness on feet  Other abnormalities of gait and mobility     Problem List Patient Active Problem List   Diagnosis Date Noted  . Tobacco use disorder 07/09/2018  . Small vessel disease ([place]) 07/09/2018  . Benign essential HTN   . Seizures ([place])   . Tachypnea   . Dysphagia, post-stroke   . Dysarthria 05/19/2018  . Syncope and collapse   . Syncope 08/24/2017  . Closed left femoral fracture ([place]) 04/21/2017  . Seizure disorder ([place]) 04/21/2017  . Closed pertrochanteric fracture of femur, left, initial encounter ([place]) 03/02/2017  . Seizure ([place]) 02/28/2017  . Hip fracture ([place]) 02/28/2017  . Chest pain 12/07/2016  . Atypical chest pain 12/07/2016  . Tachycardia   . NSTEMI (non-ST elevated myocardial infarction) ([place]) 11/02/2016  . Multifocal atrial tachycardia ([place]) 09/25/2016  . Elevated troponin 09/24/2016  . Hypokalemia 09/23/2016  . TIA (transient ischemic attack) 05/05/2016  . Intractable nausea and vomiting 10/15/2015  . Acute coronary syndrome ([place]) 06/01/2015  . Bronchitis 06/01/2015  . Thrombocytopenia ([place]) 06/01/2015  . Cerebral infarction ([place]) right occipital due to small vessel disease s/p tPA 03/06/2014  . Lower urinary tract infectious disease   . PSVT (paroxysmal supraventricular tachycardia) ([place]) 12/09/2013  . Esophageal varices ([place]) 03/29/2013  . Hematemesis 03/29/2013  . Hepatic cirrhosis ([place]) 03/29/2013  . Presence of stent in right coronary artery 07/04/2011  . OSA (obstructive sleep apnea) 07/04/2011  . Aortic sclerosis 07/04/2011  . Aortic insufficiency 07/04/2011  . HTN (hypertension) 07/04/2011   . Carotid stenosis, bilateral 07/04/2011  . Chest pain at rest 07/03/2011  . CAD (coronary artery disease) 07/03/2011  . Hypercholesteremia 07/03/2011  . GERD (gastroesophageal reflux disease) 07/03/2011   Clarene Critchley PT, DPT 3:25 PM, 10/20/18 [place], [place], [address] Phone: [phone]   Fax:  [phone]  Name: [name] MRN: [id number] Date of Birth: 07/12/1941

## 2018-10-22 ENCOUNTER — Encounter (HOSPITAL_COMMUNITY): Payer: Self-pay | Admitting: Physical Therapy

## 2018-10-22 ENCOUNTER — Ambulatory Visit (HOSPITAL_COMMUNITY): Payer: Medicare Other | Admitting: Physical Therapy

## 2018-10-22 DIAGNOSIS — R29818 Other symptoms and signs involving the nervous system: Secondary | ICD-10-CM

## 2018-10-22 DIAGNOSIS — R2689 Other abnormalities of gait and mobility: Secondary | ICD-10-CM

## 2018-10-22 DIAGNOSIS — M6281 Muscle weakness (generalized): Secondary | ICD-10-CM | POA: Diagnosis not present

## 2018-10-22 DIAGNOSIS — R29898 Other symptoms and signs involving the musculoskeletal system: Secondary | ICD-10-CM | POA: Diagnosis not present

## 2018-10-22 DIAGNOSIS — R2681 Unsteadiness on feet: Secondary | ICD-10-CM

## 2018-10-22 DIAGNOSIS — M25531 Pain in right wrist: Secondary | ICD-10-CM | POA: Diagnosis not present

## 2018-10-22 DIAGNOSIS — M25631 Stiffness of right wrist, not elsewhere classified: Secondary | ICD-10-CM | POA: Diagnosis not present

## 2018-10-22 NOTE — Therapy (Signed)
[place], [place], [address] Phone: [phone]   Fax:  [phone]  Physical Therapy Treatment  Patient Details  Name: [name] MRN: [id number] Date of Birth: [birth year] Referring Provider (PT): Letta Pate Luanna Salk, MD   Encounter Date: 10/22/2018  PT End of Session - 10/22/18 1319    Visit Number  8    Number of Visits  19    Date for PT Re-Evaluation  11/17/18   Minireassess 10/27/18   Authorization Type  U[employer] Medicare    Authorization Time Period  10/06/18 - 11/17/18    Authorization - Visit Number  8    Authorization - Number of Visits  10    PT Start Time  6144    PT Stop Time  1340    PT Time Calculation (min)  43 min    Equipment Utilized During Treatment  Gait belt    Activity Tolerance  Patient tolerated treatment well;No increased pain    Behavior During Therapy  WFL for tasks assessed/performed       Past Medical History:  Diagnosis Date  . Anemia   . Aortic insufficiency    Moderate  . Back pain   . Carotid stenosis, bilateral   . Cirrhosis ([place])   . Closed left femoral fracture ([place]) 04/21/2017  . Coronary artery disease    Stent x 2 RCA 1995, cardiac catheterization 09/2016 showing only mild atherosclerosis  . DDD (degenerative disc disease), lumbar   . Essential hypertension   . Falls   . GERD (gastroesophageal reflux disease)   . Hiatal hernia   . History of stroke   . Hypercholesteremia   . IBS (irritable bowel syndrome)   . Non-alcoholic fatty liver disease   . OSA (obstructive sleep apnea)    no CPAP  . Pericardial effusion    a. small by echo 2018.  Marland Kitchen PSVT (paroxysmal supraventricular tachycardia) ([place])   . Recurrent UTI   . Seizures ([place])   . Stroke ([place])   . Type 2 diabetes mellitus ([place])    off medication  . Varices, esophageal ([place])     Past Surgical History:  Procedure Laterality Date  . [place]  . APPENDECTOMY    . BACK SURGERY  1985  .  CARDIAC CATHETERIZATION  [phone]   Stent to the proximal RCA after MI   . CARDIAC CATHETERIZATION N/A 09/26/2016   Procedure: Left Heart Cath and Coronary Angiography;  Surgeon: Leonie Man, MD;  Location: [facility];  Service: Cardiovascular;  Laterality: N/A;  . CATARACT EXTRACTION W/PHACO Right 07/04/2014   Procedure: CATARACT EXTRACTION PHACO AND INTRAOCULAR LENS PLACEMENT (IOC);  Surgeon: Elta Guadeloupe T. Gershon Crane, MD;  Location: [facility];  Service: Ophthalmology;  Laterality: Right;  CDE:13.13  . COLONOSCOPY    . DILATION AND CURETTAGE OF UTERUS     x2  . Epi Retinal Membrane Peel Left   . ERCP    . ESOPHAGEAL BANDING N/A 04/01/2013   Procedure: ESOPHAGEAL BANDING;  Surgeon: Rogene Houston, MD;  [facility];  Service: Endoscopy;  Laterality: N/A;  . ESOPHAGEAL BANDING N/A 05/24/2013   Procedure: ESOPHAGEAL BANDING;  Surgeon: Rogene Houston, MD;  [facility];  Service: Endoscopy;  Laterality: N/A;  . ESOPHAGEAL BANDING N/A 06/21/2014   Procedure: ESOPHAGEAL BANDING;  Surgeon: Rogene Houston, MD;  [facility];  Service: Endoscopy;  Laterality: N/A;  . ESOPHAGOGASTRODUODENOSCOPY N/A 04/01/2013  Procedure: ESOPHAGOGASTRODUODENOSCOPY (EGD);  Surgeon: Rogene Houston, MD;  [facility];  Service: Endoscopy;  Laterality: N/A;  230-rescheduled to 8:30am Ann notified pt  . ESOPHAGOGASTRODUODENOSCOPY N/A 05/24/2013   Procedure: ESOPHAGOGASTRODUODENOSCOPY (EGD);  Surgeon: Rogene Houston, MD;  [facility];  Service: Endoscopy;  Laterality: N/A;  730  . ESOPHAGOGASTRODUODENOSCOPY N/A 06/21/2014   Procedure: ESOPHAGOGASTRODUODENOSCOPY (EGD);  Surgeon: Rogene Houston, MD;  [facility];  Service: Endoscopy;  Laterality: N/A;  930-rescheduled 10/14 @ 1200 Ann to notify pt  . EYE SURGERY  08   cataract surgery of the left eye  . FEMUR IM NAIL Left 03/02/2017   Procedure: INTRAMEDULLARY (IM) NAIL FEMORAL;  Surgeon: Rod Can, MD;  Location: [facility];  Service: Orthopedics;  Laterality: Left;  . HARDWARE REMOVAL Right 01/17/2013   Procedure: REMOVAL OF HARDWARE AND EXCISION ULNAR STYLOID RIGHT WRIST;  Surgeon: Tennis Must, MD;  Location: [facility];  Service: Orthopedics;  Laterality: Right;  . MYRINGOTOMY  2012   both ears  . ORIF FEMUR FRACTURE Left 04/23/2017   Procedure: OPEN REDUCTION INTERNAL FIXATION (ORIF) DISTAL FEMUR FRACTURE (FRACTURE AROUND FEMORAL NAIL);  Surgeon: Altamese Ronan, MD;  Location: [facility];  Service: Orthopedics;  Laterality: Left;  . TONSILLECTOMY    . VAGINAL HYSTERECTOMY  1972  . WRIST SURGERY     rt wrist hardwear removal    There were no vitals filed for this visit.  Subjective Assessment - 10/22/18 1312    Subjective  Patient did not complain of pain. She stated that she has been able to do her exercises at home.     Pertinent History  Stroke on 07/06/18 with administration of TPA per chart review; History of previous TIAs/strokes and history of seizures, DMII, CAD, Carotid artery stenosis    Limitations  Standing;Walking    How long can you sit comfortably?  Not limited    How long can you stand comfortably?  15-20 minutes    How long can you walk comfortably?  10 minutes    Diagnostic tests  MRI 07/07/18: "punctate new white matter infarct in the anterior right occipital" "No acute intracranial abnormality" See imaging for further details    Patient Stated Goals  Walk a little better    Currently in Pain?  No/denies                       [facility] Adult PT Treatment/Exercise - 10/22/18 0001      Ambulation/Gait   Ambulation/Gait  Yes    Ambulation Distance (Feet)  200 Feet   120 feet at the end of session also   Assistive device  Rolling walker    Gait Comments  Using metronome for step length at 35-40 BPM.       Knee/Hip Exercises: Supine   Bridges  2 sets;10 reps    Other Supine Knee/Hip Exercises  Isometric clam shell with belt 10x10''           Balance Exercises - 10/22/18 1320      Balance Exercises: Standing   Other Standing Exercises  NBOS standing with rotating head to the left/right and up/down x10 each.         PT Education - 10/22/18 1313    Education Details  Discussed purpose and technique of interventions throughout session.     Person(s) Educated  Patient    Methods  Explanation    Comprehension  Verbalized understanding  PT Short Term Goals - 10/18/18 1036      PT SHORT TERM GOAL #1   Title  Patient will demonstrate understanding and report regular compliance with HEP to improve strength, balance and functional mobility.     Time  3    Period  Weeks    Status  On-going      PT SHORT TERM GOAL #2   Title  Patient will report ability to ambulate for at least 20 minutes in order to go to the grocery store and pick out food without frequent rest breaks.    Time  3    Period  Weeks    Status  On-going        PT Long Term Goals - 10/18/18 1036      PT LONG TERM GOAL #1   Title  Patient will demonstrate improvement of at least 1/2 MMT strength grade in all deficient hip and knee muscles in order to improve stability and balance and decrease patient's risk of falls.     Time  6    Period  Weeks    Status  New      PT LONG TERM GOAL #2   Title  Patient will demonstrate improvement of at least 10 seconds on the TUG indicating improved functional mobility and balance.     Time  6    Period  Weeks    Status  On-going      PT LONG TERM GOAL #3   Title  Patient will demonstrate improvement of at least 7 points on the [facility] Balance Scale indicating improved balance, safety, and decreased risk of falls.     Time  6    Period  Weeks    Status  On-going            Plan - 10/22/18 1404    Clinical Impression Statement  Began session with gait training this session using a metronome to improve patient's consistency with step length. This session also added isometric clamshells for hip  abduction activation. This session focused on balance training as well. Patient reported feeling good throughout session.     Rehab Potential  Good    Clinical Impairments Affecting Rehab Potential  Positive: Motivated; Negative: Length of time since patient's stroke    PT Frequency  3x / week    PT Duration  6 weeks    PT Treatment/Interventions  ADLs/Self Care Home Management;Aquatic Therapy;DME Instruction;Gait training;Stair training;Functional mobility training;Therapeutic activities;Therapeutic exercise;Balance training;Neuromuscular re-education;Patient/family education;Orthotic Fit/Training;Wheelchair mobility training;Manual techniques;Passive range of motion;Energy conservation;Taping    PT Next Visit Plan  Focus on LT hip abductor and extensor strength, gt as well as balance. Work on balance activites next session consider progressing to AROM clams next session.     PT Home Exercise Plan  10/08/18: Constance Haw, seated marching, SLR, LAQ    Consulted and Agree with Plan of Care  Patient       Patient will benefit from skilled therapeutic intervention in order to improve the following deficits and impairments:  Abnormal gait, Decreased balance, Decreased endurance, Decreased mobility, Difficulty walking, Improper body mechanics, Decreased activity tolerance, Decreased strength, Postural dysfunction  Visit Diagnosis: Other symptoms and signs involving the nervous system  Muscle weakness (generalized)  Unsteadiness on feet  Other abnormalities of gait and mobility     Problem List Patient Active Problem List   Diagnosis Date Noted  . Tobacco use disorder 07/09/2018  . Small vessel disease ([place]) 07/09/2018  . Benign essential HTN   .  Seizures ([place])   . Tachypnea   . Dysphagia, post-stroke   . Dysarthria 05/19/2018  . Syncope and collapse   . Syncope 08/24/2017  . Closed left femoral fracture ([place]) 04/21/2017  . Seizure disorder ([place]) 04/21/2017  . Closed pertrochanteric  fracture of femur, left, initial encounter ([place]) 03/02/2017  . Seizure ([place]) 02/28/2017  . Hip fracture ([place]) 02/28/2017  . Chest pain 12/07/2016  . Atypical chest pain 12/07/2016  . Tachycardia   . NSTEMI (non-ST elevated myocardial infarction) ([place]) 11/02/2016  . Multifocal atrial tachycardia ([place]) 09/25/2016  . Elevated troponin 09/24/2016  . Hypokalemia 09/23/2016  . TIA (transient ischemic attack) 05/05/2016  . Intractable nausea and vomiting 10/15/2015  . Acute coronary syndrome ([place]) 06/01/2015  . Bronchitis 06/01/2015  . Thrombocytopenia ([place]) 06/01/2015  . Cerebral infarction ([place]) right occipital due to small vessel disease s/p tPA 03/06/2014  . Lower urinary tract infectious disease   . PSVT (paroxysmal supraventricular tachycardia) ([place]) 12/09/2013  . Esophageal varices ([place]) 03/29/2013  . Hematemesis 03/29/2013  . Hepatic cirrhosis ([place]) 03/29/2013  . Presence of stent in right coronary artery 07/04/2011  . OSA (obstructive sleep apnea) 07/04/2011  . Aortic sclerosis 07/04/2011  . Aortic insufficiency 07/04/2011  . HTN (hypertension) 07/04/2011  . Carotid stenosis, bilateral 07/04/2011  . Chest pain at rest 07/03/2011  . CAD (coronary artery disease) 07/03/2011  . Hypercholesteremia 07/03/2011  . GERD (gastroesophageal reflux disease) 07/03/2011   Clarene Critchley PT, DPT 2:09 PM, 10/22/18 [place], [place], [address] Phone: [phone]   Fax:  [phone]  Name: Leaner Morici MRN: [id number] Date of Birth: 1941/07/25

## 2018-10-25 ENCOUNTER — Encounter (HOSPITAL_COMMUNITY): Payer: Self-pay | Admitting: Physical Therapy

## 2018-10-25 ENCOUNTER — Ambulatory Visit (HOSPITAL_COMMUNITY): Payer: Medicare Other | Admitting: Physical Therapy

## 2018-10-25 DIAGNOSIS — R29818 Other symptoms and signs involving the nervous system: Secondary | ICD-10-CM | POA: Diagnosis not present

## 2018-10-25 DIAGNOSIS — M25531 Pain in right wrist: Secondary | ICD-10-CM | POA: Diagnosis not present

## 2018-10-25 DIAGNOSIS — R2681 Unsteadiness on feet: Secondary | ICD-10-CM

## 2018-10-25 DIAGNOSIS — R29898 Other symptoms and signs involving the musculoskeletal system: Secondary | ICD-10-CM | POA: Diagnosis not present

## 2018-10-25 DIAGNOSIS — M6281 Muscle weakness (generalized): Secondary | ICD-10-CM

## 2018-10-25 DIAGNOSIS — R2689 Other abnormalities of gait and mobility: Secondary | ICD-10-CM | POA: Diagnosis not present

## 2018-10-25 DIAGNOSIS — M25631 Stiffness of right wrist, not elsewhere classified: Secondary | ICD-10-CM | POA: Diagnosis not present

## 2018-10-25 NOTE — Therapy (Signed)
[facility] [address] [place], [place], [address] Phone: [phone]   Fax:  [phone]  Physical Therapy Treatment  Patient Details  Name: [name] MRN: [id number] Date of Birth: Aug 16, 1941 Referring Provider (PT): Letta Pate Luanna Salk, MD   Encounter Date: 10/25/2018  PT End of Session - 10/25/18 1027    Visit Number  9    Number of Visits  19    Date for PT Re-Evaluation  11/17/18   Minireassess 10/27/18   Authorization Type  [employer] Medicare    Authorization Time Period  10/06/18 - 11/17/18    Authorization - Visit Number  9    Authorization - Number of Visits  10    PT Start Time  0177    PT Stop Time  1025    PT Time Calculation (min)  38 min    Equipment Utilized During Treatment  Gait belt    Activity Tolerance  Patient tolerated treatment well;No increased pain    Behavior During Therapy  WFL for tasks assessed/performed       Past Medical History:  Diagnosis Date  . Anemia   . Aortic insufficiency    Moderate  . Back pain   . Carotid stenosis, bilateral   . Cirrhosis ([place])   . Closed left femoral fracture ([place]) 04/21/2017  . Coronary artery disease    Stent x 2 RCA 1995, cardiac catheterization 09/2016 showing only mild atherosclerosis  . DDD (degenerative disc disease), lumbar   . Essential hypertension   . Falls   . GERD (gastroesophageal reflux disease)   . Hiatal hernia   . History of stroke   . Hypercholesteremia   . IBS (irritable bowel syndrome)   . Non-alcoholic fatty liver disease   . OSA (obstructive sleep apnea)    no CPAP  . Pericardial effusion    a. small by echo 2018.  Marland Kitchen PSVT (paroxysmal supraventricular tachycardia) ([place])   . Recurrent UTI   . Seizures ([place])   . Stroke ([place])   . Type 2 diabetes mellitus ([place])    off medication  . Varices, esophageal ([place])     Past Surgical History:  Procedure Laterality Date  . [place]  . APPENDECTOMY    . BACK SURGERY  1985  .  CARDIAC CATHETERIZATION  [phone]   Stent to the proximal RCA after MI   . CARDIAC CATHETERIZATION N/A 09/26/2016   Procedure: Left Heart Cath and Coronary Angiography;  Surgeon: Leonie Man, MD;  Location: [facility];  Service: Cardiovascular;  Laterality: N/A;  . CATARACT EXTRACTION W/PHACO Right 07/04/2014   Procedure: CATARACT EXTRACTION PHACO AND INTRAOCULAR LENS PLACEMENT (IOC);  Surgeon: Elta Guadeloupe T. Gershon Crane, MD;  [facility]: [facility];  Service: Ophthalmology;  Laterality: Right;  CDE:13.13  . COLONOSCOPY    . DILATION AND CURETTAGE OF UTERUS     x2  . Epi Retinal Membrane Peel Left   . ERCP    . ESOPHAGEAL BANDING N/A 04/01/2013   Procedure: ESOPHAGEAL BANDING;  Surgeon: Rogene Houston, MD;  [facility];  Service: Endoscopy;  Laterality: N/A;  . ESOPHAGEAL BANDING N/A 05/24/2013   Procedure: ESOPHAGEAL BANDING;  Surgeon: Rogene Houston, MD;  [facility];  Service: Endoscopy;  Laterality: N/A;  . ESOPHAGEAL BANDING N/A 06/21/2014   Procedure: ESOPHAGEAL BANDING;  Surgeon: Rogene Houston, MD;  [facility];  Service: Endoscopy;  Laterality: N/A;  . ESOPHAGOGASTRODUODENOSCOPY N/A 04/01/2013  Procedure: ESOPHAGOGASTRODUODENOSCOPY (EGD);  Surgeon: Rogene Houston, MD;  [facility];  Service: Endoscopy;  Laterality: N/A;  230-rescheduled to 8:30am Ann notified pt  . ESOPHAGOGASTRODUODENOSCOPY N/A 05/24/2013   Procedure: ESOPHAGOGASTRODUODENOSCOPY (EGD);  Surgeon: Rogene Houston, MD;  [facility];  Service: Endoscopy;  Laterality: N/A;  730  . ESOPHAGOGASTRODUODENOSCOPY N/A 06/21/2014   Procedure: ESOPHAGOGASTRODUODENOSCOPY (EGD);  Surgeon: Rogene Houston, MD;  [facility];  Service: Endoscopy;  Laterality: N/A;  930-rescheduled 10/14 @ 1200 Ann to notify pt  . EYE SURGERY  08   cataract surgery of the left eye  . FEMUR IM NAIL Left 03/02/2017   Procedure: INTRAMEDULLARY (IM) NAIL FEMORAL;  Surgeon: Rod Can, MD;  Location: [facility];  Service: Orthopedics;  Laterality: Left;  . HARDWARE REMOVAL Right 01/17/2013   Procedure: REMOVAL OF HARDWARE AND EXCISION ULNAR STYLOID RIGHT WRIST;  Surgeon: Tennis Must, MD;  Location: [facility];  Service: Orthopedics;  Laterality: Right;  . MYRINGOTOMY  2012   both ears  . ORIF FEMUR FRACTURE Left 04/23/2017   Procedure: OPEN REDUCTION INTERNAL FIXATION (ORIF) DISTAL FEMUR FRACTURE (FRACTURE AROUND FEMORAL NAIL);  Surgeon: Altamese Sublimity, MD;  Location: [facility];  Service: Orthopedics;  Laterality: Left;  . TONSILLECTOMY    . VAGINAL HYSTERECTOMY  1972  . WRIST SURGERY     rt wrist hardwear removal    There were no vitals filed for this visit.  Subjective Assessment - 10/25/18 0950    Subjective  PT is completing her exercises she states that sitting causes some hip pain but walking is much easier since starting therapy.     Pertinent History  Stroke on 07/06/18 with administration of TPA per chart review; History of previous TIAs/strokes and history of seizures, DMII, CAD, Carotid artery stenosis    Limitations  Standing;Walking    How long can you sit comfortably?  Not limited    How long can you stand comfortably?  15-20 minutes    How long can you walk comfortably?  10 minutes    Diagnostic tests  MRI 07/07/18: "punctate new white matter infarct in the anterior right occipital" "No acute intracranial abnormality" See imaging for further details    Patient Stated Goals  Walk a little better    Currently in Pain?  No/denies                       [facility] Adult PT Treatment/Exercise - 10/25/18 0001      Ambulation/Gait   Ambulation/Gait  Yes    Ambulation Distance (Feet)  200 Feet    Assistive device  Rolling walker    Gait Comments  Working on keeping head up, shoulders back and even steps, staying close to walker and not stopping walker       Knee/Hip Exercises: Standing   Functional Squat  10 reps           Balance Exercises - 10/25/18 0951      Balance Exercises: Standing   Standing Eyes Opened  Narrow base of support (BOS);Foam/compliant surface;3 reps;30 secs;Other (comment)   Intermittent HHA   Tandem Stance  Eyes open;2 reps;30 secs;Other (comment)   Intermittent HHA alternating feet   Sidestepping  2 reps;Theraband   red    Marching Limitations  10   one hand hold    Sit to Stand Time  10          PT Short Term Goals -  10/18/18 1036      PT SHORT TERM GOAL #1   Title  Patient will demonstrate understanding and report regular compliance with HEP to improve strength, balance and functional mobility.     Time  3    Period  Weeks    Status  On-going      PT SHORT TERM GOAL #2   Title  Patient will report ability to ambulate for at least 20 minutes in order to go to the grocery store and pick out food without frequent rest breaks.    Time  3    Period  Weeks    Status  On-going        PT Long Term Goals - 10/18/18 1036      PT LONG TERM GOAL #1   Title  Patient will demonstrate improvement of at least 1/2 MMT strength grade in all deficient hip and knee muscles in order to improve stability and balance and decrease patient's risk of falls.     Time  6    Period  Weeks    Status  New      PT LONG TERM GOAL #2   Title  Patient will demonstrate improvement of at least 10 seconds on the TUG indicating improved functional mobility and balance.     Time  6    Period  Weeks    Status  On-going      PT LONG TERM GOAL #3   Title  Patient will demonstrate improvement of at least 7 points on the [facility] Balance Scale indicating improved balance, safety, and decreased risk of falls.     Time  6    Period  Weeks    Status  On-going            Plan - 10/25/18 1027    Clinical Impression Statement  Pt continues to work on balance, strength and improving gait.  Pt continues to have a tendency to look down and ambulate with short unequal strides but does well when  corrected.  Balance is improving but pt continues to lose her balance with center of gravity going behind and to the left, again with significant verbal cuing pt can correct herself.     Rehab Potential  Good    Clinical Impairments Affecting Rehab Potential  Positive: Motivated; Negative: Length of time since patient's stroke    PT Frequency  3x / week    PT Duration  6 weeks    PT Treatment/Interventions  ADLs/Self Care Home Management;Aquatic Therapy;DME Instruction;Gait training;Stair training;Functional mobility training;Therapeutic activities;Therapeutic exercise;Balance training;Neuromuscular re-education;Patient/family education;Orthotic Fit/Training;Wheelchair mobility training;Manual techniques;Passive range of motion;Energy conservation;Taping    PT Next Visit Plan  Visit 10 next visit please reassess and complete 10th visit progress note to MD.    PT Home Exercise Plan  10/08/18: Constance Haw, seated marching, SLR, LAQ    Consulted and Agree with Plan of Care  Patient       Patient will benefit from skilled therapeutic intervention in order to improve the following deficits and impairments:  Abnormal gait, Decreased balance, Decreased endurance, Decreased mobility, Difficulty walking, Improper body mechanics, Decreased activity tolerance, Decreased strength, Postural dysfunction  Visit Diagnosis: Other symptoms and signs involving the nervous system  Muscle weakness (generalized)  Unsteadiness on feet     Problem List Patient Active Problem List   Diagnosis Date Noted  . Tobacco use disorder 07/09/2018  . Small vessel disease ([place]) 07/09/2018  . Benign essential HTN   . Seizures ([place])   .  Tachypnea   . Dysphagia, post-stroke   . Dysarthria 05/19/2018  . Syncope and collapse   . Syncope 08/24/2017  . Closed left femoral fracture ([place]) 04/21/2017  . Seizure disorder ([place]) 04/21/2017  . Closed pertrochanteric fracture of femur, left, initial encounter ([place]) 03/02/2017  .  Seizure ([place]) 02/28/2017  . Hip fracture ([place]) 02/28/2017  . Chest pain 12/07/2016  . Atypical chest pain 12/07/2016  . Tachycardia   . NSTEMI (non-ST elevated myocardial infarction) ([place]) 11/02/2016  . Multifocal atrial tachycardia ([place]) 09/25/2016  . Elevated troponin 09/24/2016  . Hypokalemia 09/23/2016  . TIA (transient ischemic attack) 05/05/2016  . Intractable nausea and vomiting 10/15/2015  . Acute coronary syndrome ([place]) 06/01/2015  . Bronchitis 06/01/2015  . Thrombocytopenia ([place]) 06/01/2015  . Cerebral infarction ([place]) right occipital due to small vessel disease s/p tPA 03/06/2014  . Lower urinary tract infectious disease   . PSVT (paroxysmal supraventricular tachycardia) ([place]) 12/09/2013  . Esophageal varices ([place]) 03/29/2013  . Hematemesis 03/29/2013  . Hepatic cirrhosis ([place]) 03/29/2013  . Presence of stent in right coronary artery 07/04/2011  . OSA (obstructive sleep apnea) 07/04/2011  . Aortic sclerosis 07/04/2011  . Aortic insufficiency 07/04/2011  . HTN (hypertension) 07/04/2011  . Carotid stenosis, bilateral 07/04/2011  . Chest pain at rest 07/03/2011  . CAD (coronary artery disease) 07/03/2011  . Hypercholesteremia 07/03/2011  . GERD (gastroesophageal reflux disease) 07/03/2011   Rayetta Humphrey, PT CLT [phone] 10/25/2018, 10:31 AM  [facility] [address] [place], [place], [address] Phone: [phone]   Fax:  [phone]  Name: Ketra Duchesne MRN: [id number] Date of Birth: 1941/03/19

## 2018-10-27 ENCOUNTER — Ambulatory Visit (HOSPITAL_COMMUNITY): Payer: Medicare Other | Admitting: Physical Therapy

## 2018-10-27 ENCOUNTER — Encounter (HOSPITAL_COMMUNITY): Payer: Self-pay | Admitting: Physical Therapy

## 2018-10-27 ENCOUNTER — Ambulatory Visit (HOSPITAL_COMMUNITY): Payer: Medicare Other | Admitting: Occupational Therapy

## 2018-10-27 DIAGNOSIS — R29898 Other symptoms and signs involving the musculoskeletal system: Secondary | ICD-10-CM

## 2018-10-27 DIAGNOSIS — M25631 Stiffness of right wrist, not elsewhere classified: Secondary | ICD-10-CM | POA: Diagnosis not present

## 2018-10-27 DIAGNOSIS — M25531 Pain in right wrist: Secondary | ICD-10-CM

## 2018-10-27 DIAGNOSIS — R29818 Other symptoms and signs involving the nervous system: Secondary | ICD-10-CM

## 2018-10-27 DIAGNOSIS — M6281 Muscle weakness (generalized): Secondary | ICD-10-CM

## 2018-10-27 DIAGNOSIS — R2681 Unsteadiness on feet: Secondary | ICD-10-CM | POA: Diagnosis not present

## 2018-10-27 DIAGNOSIS — R2689 Other abnormalities of gait and mobility: Secondary | ICD-10-CM | POA: Diagnosis not present

## 2018-10-27 NOTE — Patient Instructions (Signed)
AROM Exercises: Complete exercises ___10___ times each, ____3___ times per day  1) Wrist Flexion  Start with wrist at edge of table, palm facing up. With wrist hanging slightly off table, curl wrist upward, and back down.      2) Wrist Extension  Start with wrist at edge of table, palm facing down. With wrist slightly off the edge of the table, curl wrist up and back down.      3) Radial/Ulnar Deviations  Start with forearm flat against a table, wrist hanging slightly off the edge, and palm facing the wall. Bending at the wrist only, and keeping palm facing the wall, bend wrist so fist is pointing towards the floor, back up to start position, and up towards the ceiling. Return to start.        4) WRIST PRONATION  Turn your forearm towards palm face down.  Keep your elbow bent and by the side of your  Body.      5) WRIST SUPINATION  Turn your forearm towards palm face up.  Keep your elbow bent and by the side of your  Body.

## 2018-10-27 NOTE — Therapy (Signed)
[place], [place], [address] Phone: [phone]   Fax:  [phone]  Occupational Therapy Evaluation  Patient Details  Name: [name] MRN: [id number] Date of Birth: 1941-06-05 Referring Provider (OT): Dr. Alysia Penna   Encounter Date: 10/27/2018  OT End of Session - 10/27/18 1503    Visit Number  1    Number of Visits  8    Date for OT Re-Evaluation  11/26/18    Authorization Type  [employer] Medicare    Authorization Time Period  visits based on medical necessity; $30 copay    OT Start Time  1345    OT Stop Time  1425    OT Time Calculation (min)  40 min    Activity Tolerance  Patient tolerated treatment well    Behavior During Therapy  [employer] for tasks assessed/performed       Past Medical History:  Diagnosis Date  . Anemia   . Aortic insufficiency    Moderate  . Back pain   . Carotid stenosis, bilateral   . Cirrhosis ([place])   . Closed left femoral fracture ([place]) 04/21/2017  . Coronary artery disease    Stent x 2 RCA 1995, cardiac catheterization 09/2016 showing only mild atherosclerosis  . DDD (degenerative disc disease), lumbar   . Essential hypertension   . Falls   . GERD (gastroesophageal reflux disease)   . Hiatal hernia   . History of stroke   . Hypercholesteremia   . IBS (irritable bowel syndrome)   . Non-alcoholic fatty liver disease   . OSA (obstructive sleep apnea)    no CPAP  . Pericardial effusion    a. small by echo 2018.  Marland Kitchen PSVT (paroxysmal supraventricular tachycardia) ([place])   . Recurrent UTI   . Seizures ([place])   . Stroke ([place])   . Type 2 diabetes mellitus ([place])    off medication  . Varices, esophageal ([place])     Past Surgical History:  Procedure Laterality Date  . [place]  . APPENDECTOMY    . BACK SURGERY  1985  . CARDIAC CATHETERIZATION  [phone]   Stent to the proximal RCA after MI   . CARDIAC CATHETERIZATION N/A 09/26/2016   Procedure: Left  Heart Cath and Coronary Angiography;  Surgeon: Leonie Man, MD;  Location: [facility];  Service: Cardiovascular;  Laterality: N/A;  . CATARACT EXTRACTION W/PHACO Right 07/04/2014   Procedure: CATARACT EXTRACTION PHACO AND INTRAOCULAR LENS PLACEMENT (IOC);  Surgeon: Elta Guadeloupe T. Gershon Crane, MD;  Location: [facility];  Service: Ophthalmology;  Laterality: Right;  CDE:13.13  . COLONOSCOPY    . DILATION AND CURETTAGE OF UTERUS     x2  . Epi Retinal Membrane Peel Left   . ERCP    . ESOPHAGEAL BANDING N/A 04/01/2013   Procedure: ESOPHAGEAL BANDING;  Surgeon: Rogene Houston, MD;  [facility];  Service: Endoscopy;  Laterality: N/A;  . ESOPHAGEAL BANDING N/A 05/24/2013   Procedure: ESOPHAGEAL BANDING;  Surgeon: Rogene Houston, MD;  [facility];  Service: Endoscopy;  Laterality: N/A;  . ESOPHAGEAL BANDING N/A 06/21/2014   Procedure: ESOPHAGEAL BANDING;  Surgeon: Rogene Houston, MD;  [facility];  Service: Endoscopy;  Laterality: N/A;  . ESOPHAGOGASTRODUODENOSCOPY N/A 04/01/2013   Procedure: ESOPHAGOGASTRODUODENOSCOPY (EGD);  Surgeon: Rogene Houston, MD;  [facility];  Service: Endoscopy;  Laterality: N/A;  230-rescheduled to 8:30am Ann notified pt  . ESOPHAGOGASTRODUODENOSCOPY  N/A 05/24/2013   Procedure: ESOPHAGOGASTRODUODENOSCOPY (EGD);  Surgeon: Rogene Houston, MD;  [facility];  Service: Endoscopy;  Laterality: N/A;  730  . ESOPHAGOGASTRODUODENOSCOPY N/A 06/21/2014   Procedure: ESOPHAGOGASTRODUODENOSCOPY (EGD);  Surgeon: Rogene Houston, MD;  [facility];  Service: Endoscopy;  Laterality: N/A;  930-rescheduled 10/14 @ 1200 Ann to notify pt  . EYE SURGERY  08   cataract surgery of the left eye  . FEMUR IM NAIL Left 03/02/2017   Procedure: INTRAMEDULLARY (IM) NAIL FEMORAL;  Surgeon: Rod Can, MD;  Location: [facility];  Service: Orthopedics;  Laterality: Left;  . HARDWARE REMOVAL Right 01/17/2013   Procedure: REMOVAL OF HARDWARE  AND EXCISION ULNAR STYLOID RIGHT WRIST;  Surgeon: Tennis Must, MD;  Location: [facility];  Service: Orthopedics;  Laterality: Right;  . MYRINGOTOMY  2012   both ears  . ORIF FEMUR FRACTURE Left 04/23/2017   Procedure: OPEN REDUCTION INTERNAL FIXATION (ORIF) DISTAL FEMUR FRACTURE (FRACTURE AROUND FEMORAL NAIL);  Surgeon: Altamese Gordo, MD;  Location: [facility];  Service: Orthopedics;  Laterality: Left;  . TONSILLECTOMY    . VAGINAL HYSTERECTOMY  1972  . WRIST SURGERY     rt wrist hardwear removal    There were no vitals filed for this visit.  Subjective Assessment - 10/27/18 1500    Subjective   S: I fell in October and they said it was fractured.     Pertinent History  Pt is a 78 y/o female presenting with right wrist pain after she fell on 07/06/2018 when having a CVA. Pt reports they x-rayed her wrist at the [facility] and determined she had a healing fracture, never used a cast or splint. Pt has been referred to occupational therapy for evaluation and treatment by Dr. Marchia Bond    Patient Stated Goals  To improve my wrist range of motion    Currently in Pain?  Yes    Pain Score  5     Pain Location  Wrist    Pain Orientation  Right    Pain Descriptors / Indicators  Aching;Sore    Pain Type  Acute pain    Pain Radiating Towards  sometimes to the elbow    Pain Onset  More than a month ago    Pain Frequency  Intermittent    Aggravating Factors   movement, use    Pain Relieving Factors  rest    Effect of Pain on Daily Activities  min/mod on ADL completion    Multiple Pain Sites  No        [facility] OT Assessment - 10/27/18 1338      Assessment   Medical Diagnosis  right wrist pain    Referring Provider (OT)  Dr. Alysia Penna    Onset Date/Surgical Date  07/06/18    Hand Dominance  Right    Next MD Visit  does not have one    Prior Therapy  None      Precautions   Precautions  Fall      Balance Screen   Has the patient fallen in the past 6 months  Yes    How  many times?  1    Has the patient had a decrease in activity level because of a fear of falling?   No    Is the patient reluctant to leave their home because of a fear of falling?   No      Prior Function   Level of  Independence  Independent with basic ADLs;Independent with community mobility with device    Vocation  Retired    Leisure  watching movies, reading      ADL   ADL comments  Pt is having difficulty with pouring drinks, grabbing things to get out of the cabinet, curling her hair       Written Expression   Dominant Hand  Right      Cognition   Overall Cognitive Status  Within Functional Limits for tasks assessed      Edema   Edema  right wrist: 16.25cm, left wrist: 15.0cm      ROM / Strength   AROM / PROM / Strength  AROM;Strength;PROM      Palpation   Palpation comment  min fascial restrictions at dorsal wrist and forearm      AROM   AROM Assessment Site  Wrist;Forearm    Right/Left Forearm  Right    Right Forearm Pronation  90 Degrees    Right Forearm Supination  80 Degrees    Right/Left Wrist  Right    Right Wrist Extension  9 Degrees    Right Wrist Flexion  20 Degrees    Right Wrist Radial Deviation  6 Degrees    Right Wrist Ulnar Deviation  22 Degrees      PROM   PROM Assessment Site  Wrist    Right/Left Wrist  Right    Right Wrist Extension  28 Degrees    Right Wrist Flexion  38 Degrees    Right Wrist Radial Deviation  10 Degrees    Right Wrist Ulnar Deviation  22 Degrees      Strength   Strength Assessment Site  Wrist;Hand;Forearm    Right/Left Forearm  Right    Right Forearm Pronation  5/5    Right Forearm Supination  4+/5    Right/Left Wrist  Right    Right Wrist Flexion  5/5    Right Wrist Extension  4/5    Right Wrist Radial Deviation  4+/5    Right Wrist Ulnar Deviation  5/5    Right/Left hand  Right;Left    Right Hand Gross Grasp  Functional    Right Hand Grip (lbs)  40    Right Hand Lateral Pinch  7 lbs    Right Hand 3 Point Pinch  8  lbs    Left Hand Gross Grasp  Functional    Left Hand Grip (lbs)  35    Left Hand Lateral Pinch  9 lbs    Left Hand 3 Point Pinch  10 lbs                      OT Education - 10/27/18 1503    Education Details  wrist A/ROM    Person(s) Educated  Patient    Methods  Explanation;Demonstration;Handout    Comprehension  Verbalized understanding;Returned demonstration       OT Short Term Goals - 10/27/18 1507      OT SHORT TERM GOAL #1   Title  Pt will be educated on HEP to improve right wrist mobility required for ADL completion.     Time  4    Period  Weeks    Status  New    Target Date  11/26/18      OT SHORT TERM GOAL #2   Title  Pt will decrease right wrist pain to 3/10 or less to improve ability to support herself comfortably when using her walker.  Time  4    Period  Weeks    Status  New      OT SHORT TERM GOAL #3   Title  Pt will increase right wrist A/ROM to [facility] to improve ability to curl her hair.     Time  4    Period  Weeks    Status  New      OT SHORT TERM GOAL #4   Title  Pt will increase right wrist strength to improve ability to maintain hold on weighted items.     Time  4    Period  Weeks    Status  New      OT SHORT TERM GOAL #5   Title  Pt will improve right pinch strength by 2# to increase ability to hold onto tools and utensils during ADLs.     Time  4    Period  Weeks    Status  New               Plan - 10/27/18 1504    Clinical Impression Statement  A: Pt is a 78 y/o female presenting with right wrist pain limiting her ability to use RUE as dominant during ADL completion. Pt demonstrates poor A/ROM, fair P/ROM during evaluation, strength is good.     Occupational Profile and client history currently impacting functional performance  Pt is motivated to return to highest level of functioning to maintain her independence.     Occupational performance deficits (Please refer to evaluation for details):  ADL's;IADL's;Leisure     Rehab Potential  Good    OT Frequency  2x / week    OT Duration  4 weeks    OT Treatment/Interventions  Self-care/ADL training;Moist Heat;Therapeutic activities;Ultrasound;Therapeutic exercise;Cryotherapy;Passive range of motion;Electrical Stimulation;Manual Therapy;Patient/family education    Plan  P: Pt will benefit from skilled OT services to decrease pain and fascial restrictions, increase R wrist ROM and strength required for ADL completion. Treatment plan: manual techniques, passive stretching, A/ROM, wrist strengthening, pinch strengthening    Clinical Decision Making  Limited treatment options, no task modification necessary    Consulted and Agree with Plan of Care  Patient       Patient will benefit from skilled therapeutic intervention in order to improve the following deficits and impairments:  Decreased range of motion, Impaired flexibility, Decreased activity tolerance, Increased fascial restrictions, Pain, Impaired UE functional use, Decreased strength  Visit Diagnosis: Other symptoms and signs involving the musculoskeletal system  Stiffness of right wrist, not elsewhere classified  Pain in right wrist    Problem List Patient Active Problem List   Diagnosis Date Noted  . Tobacco use disorder 07/09/2018  . Small vessel disease ([place]) 07/09/2018  . Benign essential HTN   . Seizures ([place])   . Tachypnea   . Dysphagia, post-stroke   . Dysarthria 05/19/2018  . Syncope and collapse   . Syncope 08/24/2017  . Closed left femoral fracture ([place]) 04/21/2017  . Seizure disorder ([place]) 04/21/2017  . Closed pertrochanteric fracture of femur, left, initial encounter ([place]) 03/02/2017  . Seizure ([place]) 02/28/2017  . Hip fracture ([place]) 02/28/2017  . Chest pain 12/07/2016  . Atypical chest pain 12/07/2016  . Tachycardia   . NSTEMI (non-ST elevated myocardial infarction) ([place]) 11/02/2016  . Multifocal atrial tachycardia ([place]) 09/25/2016  . Elevated troponin 09/24/2016  .  Hypokalemia 09/23/2016  . TIA (transient ischemic attack) 05/05/2016  . Intractable nausea and vomiting 10/15/2015  . Acute coronary syndrome ([place]) 06/01/2015  .  Bronchitis 06/01/2015  . Thrombocytopenia ([place]) 06/01/2015  . Cerebral infarction ([place]) right occipital due to small vessel disease s/p tPA 03/06/2014  . Lower urinary tract infectious disease   . PSVT (paroxysmal supraventricular tachycardia) ([place]) 12/09/2013  . Esophageal varices ([place]) 03/29/2013  . Hematemesis 03/29/2013  . Hepatic cirrhosis ([place]) 03/29/2013  . Presence of stent in right coronary artery 07/04/2011  . OSA (obstructive sleep apnea) 07/04/2011  . Aortic sclerosis 07/04/2011  . Aortic insufficiency 07/04/2011  . HTN (hypertension) 07/04/2011  . Carotid stenosis, bilateral 07/04/2011  . Chest pain at rest 07/03/2011  . CAD (coronary artery disease) 07/03/2011  . Hypercholesteremia 07/03/2011  . GERD (gastroesophageal reflux disease) 07/03/2011   Guadelupe Sabin, OTR/L  [phone] 10/27/2018, 3:09 PM  [place], [place], [address] Phone: [phone]   Fax:  [phone]  Name: [name] MRN: [id number] Date of Birth: [birth year]

## 2018-10-27 NOTE — Therapy (Addendum)
[place], [place], [address] Phone: [phone]   Fax:  [phone]  Physical Therapy Treatment / Progress Note  Patient Details  Name: [name] MRN: [id number] Date of Birth: 02/19/1941 Referring Provider (PT): Letta Pate Luanna Salk, MD   Encounter Date: 10/27/2018  Progress Note Reporting Period 10/06/18 to 10/27/18  See note below for Objective Data and Assessment of Progress/Goals.       PT End of Session - 10/27/18 1443    Visit Number  10    Number of Visits  19    Date for PT Re-Evaluation  11/17/18   Minireassess completed 10/27/18   Authorization Type  [employer] Medicare    Authorization Time Period  10/06/18 - 11/17/18    Authorization - Visit Number  10    Authorization - Number of Visits  10    PT Start Time  4097    PT Stop Time  3532   Some time unbilled for re-assessment   PT Time Calculation (min)  41 min    Equipment Utilized During Treatment  Gait belt    Activity Tolerance  Patient tolerated treatment well;No increased pain    Behavior During Therapy  WFL for tasks assessed/performed       Past Medical History:  Diagnosis Date  . Anemia   . Aortic insufficiency    Moderate  . Back pain   . Carotid stenosis, bilateral   . Cirrhosis ([place])   . Closed left femoral fracture ([place]) 04/21/2017  . Coronary artery disease    Stent x 2 RCA 1995, cardiac catheterization 09/2016 showing only mild atherosclerosis  . DDD (degenerative disc disease), lumbar   . Essential hypertension   . Falls   . GERD (gastroesophageal reflux disease)   . Hiatal hernia   . History of stroke   . Hypercholesteremia   . IBS (irritable bowel syndrome)   . Non-alcoholic fatty liver disease   . OSA (obstructive sleep apnea)    no CPAP  . Pericardial effusion    a. small by echo 2018.  Marland Kitchen PSVT (paroxysmal supraventricular tachycardia) ([place])   . Recurrent UTI   . Seizures ([place])   . Stroke ([place])   . Type 2  diabetes mellitus ([place])    off medication  . Varices, esophageal ([place])     Past Surgical History:  Procedure Laterality Date  . [place]  . APPENDECTOMY    . BACK SURGERY  1985  . CARDIAC CATHETERIZATION  [phone]   Stent to the proximal RCA after MI   . CARDIAC CATHETERIZATION N/A 09/26/2016   Procedure: Left Heart Cath and Coronary Angiography;  Surgeon: Leonie Man, MD;  Location: [facility];  Service: Cardiovascular;  Laterality: N/A;  . CATARACT EXTRACTION W/PHACO Right 07/04/2014   Procedure: CATARACT EXTRACTION PHACO AND INTRAOCULAR LENS PLACEMENT (IOC);  Surgeon: Elta Guadeloupe T. Gershon Crane, MD;  Location: [facility];  Service: Ophthalmology;  Laterality: Right;  CDE:13.13  . COLONOSCOPY    . DILATION AND CURETTAGE OF UTERUS     x2  . Epi Retinal Membrane Peel Left   . ERCP    . ESOPHAGEAL BANDING N/A 04/01/2013   Procedure: ESOPHAGEAL BANDING;  Surgeon: Rogene Houston, MD;  [facility];  Service: Endoscopy;  Laterality: N/A;  . ESOPHAGEAL BANDING N/A 05/24/2013   Procedure: ESOPHAGEAL BANDING;  Surgeon: Rogene Houston, MD;  [facility];  Service: Endoscopy;  Laterality: N/A;  .  ESOPHAGEAL BANDING N/A 06/21/2014   Procedure: ESOPHAGEAL BANDING;  Surgeon: Rogene Houston, MD;  [facility];  Service: Endoscopy;  Laterality: N/A;  . ESOPHAGOGASTRODUODENOSCOPY N/A 04/01/2013   Procedure: ESOPHAGOGASTRODUODENOSCOPY (EGD);  Surgeon: Rogene Houston, MD;  [facility];  Service: Endoscopy;  Laterality: N/A;  230-rescheduled to 8:30am Ann notified pt  . ESOPHAGOGASTRODUODENOSCOPY N/A 05/24/2013   Procedure: ESOPHAGOGASTRODUODENOSCOPY (EGD);  Surgeon: Rogene Houston, MD;  [facility];  Service: Endoscopy;  Laterality: N/A;  730  . ESOPHAGOGASTRODUODENOSCOPY N/A 06/21/2014   Procedure: ESOPHAGOGASTRODUODENOSCOPY (EGD);  Surgeon: Rogene Houston, MD;  [facility];  Service: Endoscopy;  Laterality:  N/A;  930-rescheduled 10/14 @ 1200 Ann to notify pt  . EYE SURGERY  08   cataract surgery of the left eye  . FEMUR IM NAIL Left 03/02/2017   Procedure: INTRAMEDULLARY (IM) NAIL FEMORAL;  Surgeon: Rod Can, MD;  Location: [facility];  Service: Orthopedics;  Laterality: Left;  . HARDWARE REMOVAL Right 01/17/2013   Procedure: REMOVAL OF HARDWARE AND EXCISION ULNAR STYLOID RIGHT WRIST;  Surgeon: Tennis Must, MD;  Location: [facility];  Service: Orthopedics;  Laterality: Right;  . MYRINGOTOMY  2012   both ears  . ORIF FEMUR FRACTURE Left 04/23/2017   Procedure: OPEN REDUCTION INTERNAL FIXATION (ORIF) DISTAL FEMUR FRACTURE (FRACTURE AROUND FEMORAL NAIL);  Surgeon: Altamese , MD;  Location: [facility];  Service: Orthopedics;  Laterality: Left;  . TONSILLECTOMY    . VAGINAL HYSTERECTOMY  1972  . WRIST SURGERY     rt wrist hardwear removal    There were no vitals filed for this visit.  Subjective Assessment - 10/27/18 1439    Subjective  Patient reported she has been doing her exercises at home.     Pertinent History  Stroke on 07/06/18 with administration of TPA per chart review; History of previous TIAs/strokes and history of seizures, DMII, CAD, Carotid artery stenosis    Limitations  Standing;Walking    How long can you sit comfortably?  Not limited    How long can you stand comfortably?  30 minutes    How long can you walk comfortably?  30 minutes    Diagnostic tests  MRI 07/07/18: "punctate new white matter infarct in the anterior right occipital" "No acute intracranial abnormality" See imaging for further details    Patient Stated Goals  Walk a little better    Currently in Pain?  Yes    Pain Score  2     Pain Location  Hip    Pain Orientation  Right    Pain Descriptors / Indicators  Aching         [facility] PT Assessment - 10/27/18 1441      Strength   Right Hip Flexion  4+/5   was 4-   Right Hip Extension  3/5   was 3-   Right Hip ABduction  4+/5   was 4    Left Hip Flexion  4/5   was 4-   Left Hip Extension  3/5   was 2-   Left Hip ABduction  2-/5   was 4+   Right Knee Flexion  4+/5   was 4+   Right Knee Extension  4+/5   was 4+   Left Knee Flexion  4+/5   was 4+   Left Knee Extension  4+/5   was 4+   Right Ankle Dorsiflexion  4+/5    Left Ankle Dorsiflexion  2/5      Berg Balance Test   Sit to Stand  Able to stand  independently using hands   was 3   Standing Unsupported  Able to stand 2 minutes with supervision   was 3   Sitting with Back Unsupported but Feet Supported on Floor or Stool  Able to sit safely and securely 2 minutes   was 4   Stand to Sit  Sits safely with minimal use of hands   was 4   Transfers  Able to transfer safely, definite need of hands   was 3   Standing Unsupported with Eyes Closed  Able to stand 10 seconds safely   was 4   Standing Ubsupported with Feet Together  Able to place feet together independently and stand 1 minute safely   was 3   From Standing, Reach Forward with Outstretched Arm  Can reach forward >12 cm safely (5")   was 3   From Standing Position, Pick up Object from Floor  Able to pick up shoe, needs supervision   was 3   From Standing Position, Turn to Look Behind Over each Shoulder  Looks behind one side only/other side shows less weight shift   was 3   Turn 360 Degrees  Able to turn 360 degrees safely but slowly   was 1   Standing Unsupported, Alternately Place Feet on Step/Stool  Needs assistance to keep from falling or unable to try   was 0   Standing Unsupported, One Foot in [employer] balance while stepping or standing   was 0   Standing on One Leg  Unable to try or needs assist to prevent fall   was 0   Total Score  36    Berg comment:  Was 34      Timed Up and Go Test   TUG  Normal TUG    Normal TUG (seconds)  73   seconds; was 90   TUG Comments  Patient performed with RW and use of hands to stand from standard height chair                   [facility]  Adult PT Treatment/Exercise - 10/27/18 0001      Knee/Hip Exercises: Seated   Long Arc Quad  Left;10 reps;Weights    Long Arc Quad Weight  --   5.5 lbs   Marching  Both;2 sets;10 reps  (Pended)     Marching Limitations  2# ankle weights  (Pended)     Sit to [employer]  10 reps   2x5 From chair with right LE behind left            PT Education - 10/27/18 1440    Education Details  Discussed examination findings and progress.     Person(s) Educated  Patient    Methods  Explanation    Comprehension  Verbalized understanding       PT Short Term Goals - 10/27/18 1526      PT SHORT TERM GOAL #1   Title  Patient will demonstrate understanding and report regular compliance with HEP to improve strength, balance and functional mobility.     Time  3    Period  Weeks    Status  Achieved      PT SHORT TERM GOAL #2   Title  Patient will report ability to ambulate for at least 20 minutes in order to go to the grocery store and pick out food without  frequent rest breaks.    Time  3    Period  Weeks    Status  Achieved        PT Long Term Goals - 10/27/18 1526      PT LONG TERM GOAL #1   Title  Patient will demonstrate improvement of at least 1/2 MMT strength grade in all deficient hip and knee muscles in order to improve stability and balance and decrease patient's risk of falls.     Baseline  10/27/18: Patient achieved in some, but not all muscle groups.     Time  6    Period  Weeks    Status  On-going      PT LONG TERM GOAL #2   Title  Patient will demonstrate improvement of at least 10 seconds on the TUG indicating improved functional mobility and balance.     Baseline  10/27/18: Patient improved time on the TUG from 90 seconds to 73 seconds at this re-assessment.     Time  6    Period  Weeks    Status  Achieved      PT LONG TERM GOAL #3   Title  Patient will demonstrate improvement of at least 7 points on the [facility] Balance Scale indicating improved balance, safety, and  decreased risk of falls.     Baseline  10/27/18: Patient improved by a couple points, but not by 7 points.     Time  6    Period  Weeks    Status  On-going            Plan - 10/27/18 1552    Clinical Impression Statement  This session performed a re-assessment of patient's progress towards goals. Patient achieved 2 out of 2 short term goals. Patient achieved 1 out of 3 long term goals. Patient has demonstrated good progress with strength and balance, however continues to be deficient in some areas. Remainder of session, focused on functional strengthening exercises and lower extremity strengthening exercises. Patient would benefit from continued skilled physical therapy in order to continue progressing towards functional goals.     Rehab Potential  Good    Clinical Impairments Affecting Rehab Potential  Positive: Motivated; Negative: Length of time since patient's stroke    PT Frequency  3x / week    PT Duration  6 weeks    PT Treatment/Interventions  ADLs/Self Care Home Management;Aquatic Therapy;DME Instruction;Gait training;Stair training;Functional mobility training;Therapeutic activities;Therapeutic exercise;Balance training;Neuromuscular re-education;Patient/family education;Orthotic Fit/Training;Wheelchair mobility training;Manual techniques;Passive range of motion;Energy conservation;Taping    PT Next Visit Plan  Continue with focus on lower extremity strengthening and balance.     PT Home Exercise Plan  10/08/18: Constance Haw, seated marching, SLR, LAQ    Consulted and Agree with Plan of Care  Patient       Patient will benefit from skilled therapeutic intervention in order to improve the following deficits and impairments:  Abnormal gait, Decreased balance, Decreased endurance, Decreased mobility, Difficulty walking, Improper body mechanics, Decreased activity tolerance, Decreased strength, Postural dysfunction  Visit Diagnosis: Other symptoms and signs involving the nervous  system  Muscle weakness (generalized)  Unsteadiness on feet  Other abnormalities of gait and mobility     Problem List Patient Active Problem List   Diagnosis Date Noted  . Tobacco use disorder 07/09/2018  . Small vessel disease ([place]) 07/09/2018  . Benign essential HTN   . Seizures ([place])   . Tachypnea   . Dysphagia, post-stroke   . Dysarthria 05/19/2018  . Syncope  and collapse   . Syncope 08/24/2017  . Closed left femoral fracture ([place]) 04/21/2017  . Seizure disorder ([place]) 04/21/2017  . Closed pertrochanteric fracture of femur, left, initial encounter ([place]) 03/02/2017  . Seizure ([place]) 02/28/2017  . Hip fracture ([place]) 02/28/2017  . Chest pain 12/07/2016  . Atypical chest pain 12/07/2016  . Tachycardia   . NSTEMI (non-ST elevated myocardial infarction) ([place]) 11/02/2016  . Multifocal atrial tachycardia ([place]) 09/25/2016  . Elevated troponin 09/24/2016  . Hypokalemia 09/23/2016  . TIA (transient ischemic attack) 05/05/2016  . Intractable nausea and vomiting 10/15/2015  . Acute coronary syndrome ([place]) 06/01/2015  . Bronchitis 06/01/2015  . Thrombocytopenia ([place]) 06/01/2015  . Cerebral infarction ([place]) right occipital due to small vessel disease s/p tPA 03/06/2014  . Lower urinary tract infectious disease   . PSVT (paroxysmal supraventricular tachycardia) ([place]) 12/09/2013  . Esophageal varices ([place]) 03/29/2013  . Hematemesis 03/29/2013  . Hepatic cirrhosis ([place]) 03/29/2013  . Presence of stent in right coronary artery 07/04/2011  . OSA (obstructive sleep apnea) 07/04/2011  . Aortic sclerosis 07/04/2011  . Aortic insufficiency 07/04/2011  . HTN (hypertension) 07/04/2011  . Carotid stenosis, bilateral 07/04/2011  . Chest pain at rest 07/03/2011  . CAD (coronary artery disease) 07/03/2011  . Hypercholesteremia 07/03/2011  . GERD (gastroesophageal reflux disease) 07/03/2011   Clarene Critchley PT, DPT 3:54 PM, 10/27/18 [place], [place], [address] Phone: [phone]   Fax:  [phone]  Name: [name] MRN: [id number] Date of Birth: 10-08-1940

## 2018-10-29 ENCOUNTER — Ambulatory Visit (HOSPITAL_COMMUNITY): Payer: Medicare Other | Admitting: Physical Therapy

## 2018-10-29 ENCOUNTER — Telehealth (HOSPITAL_COMMUNITY): Payer: Self-pay | Admitting: Internal Medicine

## 2018-10-29 NOTE — Telephone Encounter (Signed)
10/29/18  g.daughter left a message that she was feeling nauseous

## 2018-11-01 ENCOUNTER — Ambulatory Visit (HOSPITAL_COMMUNITY): Payer: Medicare Other | Admitting: Physical Therapy

## 2018-11-01 DIAGNOSIS — R2689 Other abnormalities of gait and mobility: Secondary | ICD-10-CM

## 2018-11-01 DIAGNOSIS — R2681 Unsteadiness on feet: Secondary | ICD-10-CM

## 2018-11-01 DIAGNOSIS — M25631 Stiffness of right wrist, not elsewhere classified: Secondary | ICD-10-CM | POA: Diagnosis not present

## 2018-11-01 DIAGNOSIS — M25531 Pain in right wrist: Secondary | ICD-10-CM | POA: Diagnosis not present

## 2018-11-01 DIAGNOSIS — M6281 Muscle weakness (generalized): Secondary | ICD-10-CM

## 2018-11-01 DIAGNOSIS — R29818 Other symptoms and signs involving the nervous system: Secondary | ICD-10-CM | POA: Diagnosis not present

## 2018-11-01 DIAGNOSIS — R29898 Other symptoms and signs involving the musculoskeletal system: Secondary | ICD-10-CM | POA: Diagnosis not present

## 2018-11-01 NOTE — Therapy (Signed)
[place], [place], [address] Phone: [phone]   Fax:  [phone]  Physical Therapy Treatment  Patient Details  Name: [name] MRN: [id number] Date of Birth: 1940-10-20 Referring Provider (PT): Letta Pate Luanna Salk, MD   Encounter Date: 11/01/2018  PT End of Session - 11/01/18 1730    Visit Number  11    Number of Visits  19    Date for PT Re-Evaluation  11/17/18   Minireassess completed 10/27/18   Authorization Type  U[employer] Medicare    Authorization Time Period  10/06/18 - 11/17/18    Authorization - Visit Number  11    Authorization - Number of Visits  19    PT Start Time  0539    PT Stop Time  1515    PT Time Calculation (min)  39 min    Equipment Utilized During Treatment  Gait belt    Activity Tolerance  Patient tolerated treatment well;No increased pain    Behavior During Therapy  WFL for tasks assessed/performed       Past Medical History:  Diagnosis Date  . Anemia   . Aortic insufficiency    Moderate  . Back pain   . Carotid stenosis, bilateral   . Cirrhosis ([place])   . Closed left femoral fracture ([place]) 04/21/2017  . Coronary artery disease    Stent x 2 RCA 1995, cardiac catheterization 09/2016 showing only mild atherosclerosis  . DDD (degenerative disc disease), lumbar   . Essential hypertension   . Falls   . GERD (gastroesophageal reflux disease)   . Hiatal hernia   . History of stroke   . Hypercholesteremia   . IBS (irritable bowel syndrome)   . Non-alcoholic fatty liver disease   . OSA (obstructive sleep apnea)    no CPAP  . Pericardial effusion    a. small by echo 2018.  Marland Kitchen PSVT (paroxysmal supraventricular tachycardia) ([place])   . Recurrent UTI   . Seizures ([place])   . Stroke ([place])   . Type 2 diabetes mellitus ([place])    off medication  . Varices, esophageal ([place])     Past Surgical History:  Procedure Laterality Date  . [place]  . APPENDECTOMY    . BACK  SURGERY  1985  . CARDIAC CATHETERIZATION  [id number]   Stent to the proximal RCA after MI   . CARDIAC CATHETERIZATION N/A 09/26/2016   Procedure: Left Heart Cath and Coronary Angiography;  Surgeon: Leonie Man, MD;  Location: [facility];  Service: Cardiovascular;  Laterality: N/A;  . CATARACT EXTRACTION W/PHACO Right 07/04/2014   Procedure: CATARACT EXTRACTION PHACO AND INTRAOCULAR LENS PLACEMENT (IOC);  Surgeon: Elta Guadeloupe T. Gershon Crane, MD;  [facility]: [facility];  Service: Ophthalmology;  Laterality: Right;  CDE:13.13  . COLONOSCOPY    . DILATION AND CURETTAGE OF UTERUS     x2  . Epi Retinal Membrane Peel Left   . ERCP    . ESOPHAGEAL BANDING N/A 04/01/2013   Procedure: ESOPHAGEAL BANDING;  Surgeon: Rogene Houston, MD;  [facility];  Service: Endoscopy;  Laterality: N/A;  . ESOPHAGEAL BANDING N/A 05/24/2013   Procedure: ESOPHAGEAL BANDING;  Surgeon: Rogene Houston, MD;  [facility];  Service: Endoscopy;  Laterality: N/A;  . ESOPHAGEAL BANDING N/A 06/21/2014   Procedure: ESOPHAGEAL BANDING;  Surgeon: Rogene Houston, MD;  [facility];  Service: Endoscopy;  Laterality: N/A;  . ESOPHAGOGASTRODUODENOSCOPY N/A 04/01/2013  Procedure: ESOPHAGOGASTRODUODENOSCOPY (EGD);  Surgeon: Rogene Houston, MD;  [facility];  Service: Endoscopy;  Laterality: N/A;  230-rescheduled to 8:30am Ann notified pt  . ESOPHAGOGASTRODUODENOSCOPY N/A 05/24/2013   Procedure: ESOPHAGOGASTRODUODENOSCOPY (EGD);  Surgeon: Rogene Houston, MD;  [facility];  Service: Endoscopy;  Laterality: N/A;  730  . ESOPHAGOGASTRODUODENOSCOPY N/A 06/21/2014   Procedure: ESOPHAGOGASTRODUODENOSCOPY (EGD);  Surgeon: Rogene Houston, MD;  [facility];  Service: Endoscopy;  Laterality: N/A;  930-rescheduled 10/14 @ 1200 Ann to notify pt  . EYE SURGERY  08   cataract surgery of the left eye  . FEMUR IM NAIL Left 03/02/2017   Procedure: INTRAMEDULLARY (IM) NAIL FEMORAL;   Surgeon: Rod Can, MD;  Location: [facility];  Service: Orthopedics;  Laterality: Left;  . HARDWARE REMOVAL Right 01/17/2013   Procedure: REMOVAL OF HARDWARE AND EXCISION ULNAR STYLOID RIGHT WRIST;  Surgeon: Tennis Must, MD;  Location: [facility];  Service: Orthopedics;  Laterality: Right;  . MYRINGOTOMY  2012   both ears  . ORIF FEMUR FRACTURE Left 04/23/2017   Procedure: OPEN REDUCTION INTERNAL FIXATION (ORIF) DISTAL FEMUR FRACTURE (FRACTURE AROUND FEMORAL NAIL);  Surgeon: Altamese , MD;  Location: [facility];  Service: Orthopedics;  Laterality: Left;  . TONSILLECTOMY    . VAGINAL HYSTERECTOMY  1972  . WRIST SURGERY     rt wrist hardwear removal    There were no vitals filed for this visit.  Subjective Assessment - 11/01/18 1442    Subjective  pt states she doesnt think she will be able to tolerate therapy on her Rt hand. States it is still hurting and cant hardly move it.  Pt reports she is slow moving today.     Currently in Pain?  Yes    Pain Score  5     Pain Location  Wrist    Pain Orientation  Right    Pain Descriptors / Indicators  Aching;Sore                       [facility] Adult PT Treatment/Exercise - 11/01/18 0001      Knee/Hip Exercises: Standing   Hip Abduction  Both;15 reps    Hip Extension  Both;15 reps    Functional Squat  10 reps    SLS with Vectors  10X5" each LE with 1 HHA    Other Standing Knee Exercises  tandem on foam 2X30" each LE lead (intermittent HHA)      Knee/Hip Exercises: Seated   Sit to Sand  10 reps;without UE support               PT Short Term Goals - 10/27/18 1526      PT SHORT TERM GOAL #1   Title  Patient will demonstrate understanding and report regular compliance with HEP to improve strength, balance and functional mobility.     Time  3    Period  Weeks    Status  Achieved      PT SHORT TERM GOAL #2   Title  Patient will report ability to ambulate for at least 20 minutes in order to go to the  grocery store and pick out food without frequent rest breaks.    Time  3    Period  Weeks    Status  Achieved        PT Long Term Goals - 10/27/18 1526      PT LONG TERM GOAL #1  Title  Patient will demonstrate improvement of at least 1/2 MMT strength grade in all deficient hip and knee muscles in order to improve stability and balance and decrease patient's risk of falls.     Baseline  10/27/18: Patient achieved in some, but not all muscle groups.     Time  6    Period  Weeks    Status  On-going      PT LONG TERM GOAL #2   Title  Patient will demonstrate improvement of at least 10 seconds on the TUG indicating improved functional mobility and balance.     Baseline  10/27/18: Patient improved time on the TUG from 90 seconds to 73 seconds at this re-assessment.     Time  6    Period  Weeks    Status  Achieved      PT LONG TERM GOAL #3   Title  Patient will demonstrate improvement of at least 7 points on the [facility] Balance Scale indicating improved balance, safety, and decreased risk of falls.     Baseline  10/27/18: Patient improved by a couple points, but not by 7 points.     Time  6    Period  Weeks    Status  On-going            Plan - 11/01/18 1731    Clinical Impression Statement  continued with working on improving LE strength and stability.  Pt overall with decreased mobility today, taking extra time to complete tasks, especially ambulation.  No rest breaks required this session.  Increased reps with vectors.  Cues needed for posture while completing therex.  Side stepping was most challnening for patient but able to complete without AD or HHA.     Rehab Potential  Good    Clinical Impairments Affecting Rehab Potential  Positive: Motivated; Negative: Length of time since patient's stroke    PT Frequency  3x / week    PT Duration  6 weeks    PT Treatment/Interventions  ADLs/Self Care Home Management;Aquatic Therapy;DME Instruction;Gait training;Stair training;Functional  mobility training;Therapeutic activities;Therapeutic exercise;Balance training;Neuromuscular re-education;Patient/family education;Orthotic Fit/Training;Wheelchair mobility training;Manual techniques;Passive range of motion;Energy conservation;Taping    PT Next Visit Plan  Continue with focus on lower extremity strengthening and balance.     PT Home Exercise Plan  10/08/18: Constance Haw, seated marching, SLR, LAQ    Consulted and Agree with Plan of Care  Patient       Patient will benefit from skilled therapeutic intervention in order to improve the following deficits and impairments:  Abnormal gait, Decreased balance, Decreased endurance, Decreased mobility, Difficulty walking, Improper body mechanics, Decreased activity tolerance, Decreased strength, Postural dysfunction  Visit Diagnosis: Muscle weakness (generalized)  Unsteadiness on feet  Other abnormalities of gait and mobility     Problem List Patient Active Problem List   Diagnosis Date Noted  . Tobacco use disorder 07/09/2018  . Small vessel disease ([place]) 07/09/2018  . Benign essential HTN   . Seizures ([place])   . Tachypnea   . Dysphagia, post-stroke   . Dysarthria 05/19/2018  . Syncope and collapse   . Syncope 08/24/2017  . Closed left femoral fracture ([place]) 04/21/2017  . Seizure disorder ([place]) 04/21/2017  . Closed pertrochanteric fracture of femur, left, initial encounter ([place]) 03/02/2017  . Seizure ([place]) 02/28/2017  . Hip fracture ([place]) 02/28/2017  . Chest pain 12/07/2016  . Atypical chest pain 12/07/2016  . Tachycardia   . NSTEMI (non-ST elevated myocardial infarction) ([place]) 11/02/2016  . Multifocal  atrial tachycardia ([place]) 09/25/2016  . Elevated troponin 09/24/2016  . Hypokalemia 09/23/2016  . TIA (transient ischemic attack) 05/05/2016  . Intractable nausea and vomiting 10/15/2015  . Acute coronary syndrome ([place]) 06/01/2015  . Bronchitis 06/01/2015  . Thrombocytopenia ([place]) 06/01/2015  . Cerebral infarction ([place])  right occipital due to small vessel disease s/p tPA 03/06/2014  . Lower urinary tract infectious disease   . PSVT (paroxysmal supraventricular tachycardia) ([place]) 12/09/2013  . Esophageal varices ([place]) 03/29/2013  . Hematemesis 03/29/2013  . Hepatic cirrhosis ([place]) 03/29/2013  . Presence of stent in right coronary artery 07/04/2011  . OSA (obstructive sleep apnea) 07/04/2011  . Aortic sclerosis 07/04/2011  . Aortic insufficiency 07/04/2011  . HTN (hypertension) 07/04/2011  . Carotid stenosis, bilateral 07/04/2011  . Chest pain at rest 07/03/2011  . CAD (coronary artery disease) 07/03/2011  . Hypercholesteremia 07/03/2011  . GERD (gastroesophageal reflux disease) 07/03/2011   Teena Irani, PTA/CLT [phone]  Teena Irani 11/01/2018, 5:34 PM  [facility] [address] [place], [place], [address] Phone: [phone]   Fax:  [phone]  Name: Kesley Mullens MRN: [id number] Date of Birth: [birth year]

## 2018-11-03 ENCOUNTER — Encounter (HOSPITAL_COMMUNITY): Payer: Self-pay | Admitting: Occupational Therapy

## 2018-11-03 ENCOUNTER — Ambulatory Visit (HOSPITAL_COMMUNITY): Payer: Medicare Other | Admitting: Physical Therapy

## 2018-11-03 ENCOUNTER — Ambulatory Visit (HOSPITAL_COMMUNITY): Payer: Medicare Other | Admitting: Occupational Therapy

## 2018-11-03 ENCOUNTER — Encounter (HOSPITAL_COMMUNITY): Payer: Self-pay | Admitting: Physical Therapy

## 2018-11-03 DIAGNOSIS — R2681 Unsteadiness on feet: Secondary | ICD-10-CM | POA: Diagnosis not present

## 2018-11-03 DIAGNOSIS — R2689 Other abnormalities of gait and mobility: Secondary | ICD-10-CM

## 2018-11-03 DIAGNOSIS — M6281 Muscle weakness (generalized): Secondary | ICD-10-CM

## 2018-11-03 DIAGNOSIS — M25631 Stiffness of right wrist, not elsewhere classified: Secondary | ICD-10-CM

## 2018-11-03 DIAGNOSIS — R29818 Other symptoms and signs involving the nervous system: Secondary | ICD-10-CM

## 2018-11-03 DIAGNOSIS — M25531 Pain in right wrist: Secondary | ICD-10-CM

## 2018-11-03 DIAGNOSIS — R29898 Other symptoms and signs involving the musculoskeletal system: Secondary | ICD-10-CM | POA: Diagnosis not present

## 2018-11-03 NOTE — Therapy (Signed)
[place], [place], [address] Phone: [phone]   Fax:  [phone]  Physical Therapy Treatment  Patient Details  Name: [name] MRN: [id number] Date of Birth: [birth year] Referring Provider (PT): Letta Pate Luanna Salk, MD   Encounter Date: 11/03/2018  PT End of Session - 11/03/18 1443    Visit Number  12    Number of Visits  19    Date for PT Re-Evaluation  11/17/18   Minireassess completed 10/27/18   Authorization Type  [employer]HC Medicare    Authorization Time Period  10/06/18 - 11/17/18    Authorization - Visit Number  12    Authorization - Number of Visits  19    PT Start Time  9373    PT Stop Time  1512    PT Time Calculation (min)  39 min    Equipment Utilized During Treatment  Gait belt    Activity Tolerance  Patient tolerated treatment well;No increased pain    Behavior During Therapy  WFL for tasks assessed/performed       Past Medical History:  Diagnosis Date  . Anemia   . Aortic insufficiency    Moderate  . Back pain   . Carotid stenosis, bilateral   . Cirrhosis ([place])   . Closed left femoral fracture ([place]) 04/21/2017  . Coronary artery disease    Stent x 2 RCA 1995, cardiac catheterization 09/2016 showing only mild atherosclerosis  . DDD (degenerative disc disease), lumbar   . Essential hypertension   . Falls   . GERD (gastroesophageal reflux disease)   . Hiatal hernia   . History of stroke   . Hypercholesteremia   . IBS (irritable bowel syndrome)   . Non-alcoholic fatty liver disease   . OSA (obstructive sleep apnea)    no CPAP  . Pericardial effusion    a. small by echo 2018.  Marland Kitchen PSVT (paroxysmal supraventricular tachycardia) ([place])   . Recurrent UTI   . Seizures ([place])   . Stroke ([place])   . Type 2 diabetes mellitus ([place])    off medication  . Varices, esophageal ([place])     Past Surgical History:  Procedure Laterality Date  . [place]  . APPENDECTOMY    . BACK  SURGERY  1985  . CARDIAC CATHETERIZATION  [phone]   Stent to the proximal RCA after MI   . CARDIAC CATHETERIZATION N/A 09/26/2016   Procedure: Left Heart Cath and Coronary Angiography;  Surgeon: Leonie Man, MD;  Location: [facility];  Service: Cardiovascular;  Laterality: N/A;  . CATARACT EXTRACTION W/PHACO Right 07/04/2014   Procedure: CATARACT EXTRACTION PHACO AND INTRAOCULAR LENS PLACEMENT (IOC);  Surgeon: Elta Guadeloupe T. Gershon Crane, MD;  [facility];  Service: Ophthalmology;  Laterality: Right;  CDE:13.13  . COLONOSCOPY    . DILATION AND CURETTAGE OF UTERUS     x2  . Epi Retinal Membrane Peel Left   . ERCP    . ESOPHAGEAL BANDING N/A 04/01/2013   Procedure: ESOPHAGEAL BANDING;  Surgeon: Rogene Houston, MD;  [facility];  Service: Endoscopy;  Laterality: N/A;  . ESOPHAGEAL BANDING N/A 05/24/2013   Procedure: ESOPHAGEAL BANDING;  Surgeon: Rogene Houston, MD;  [facility];  Service: Endoscopy;  Laterality: N/A;  . ESOPHAGEAL BANDING N/A 06/21/2014   Procedure: ESOPHAGEAL BANDING;  Surgeon: Rogene Houston, MD;  [facility];  Service: Endoscopy;  Laterality: N/A;  . ESOPHAGOGASTRODUODENOSCOPY N/A 04/01/2013  Procedure: ESOPHAGOGASTRODUODENOSCOPY (EGD);  Surgeon: Rogene Houston, MD;  [facility];  Service: Endoscopy;  Laterality: N/A;  230-rescheduled to 8:30am Ann notified pt  . ESOPHAGOGASTRODUODENOSCOPY N/A 05/24/2013   Procedure: ESOPHAGOGASTRODUODENOSCOPY (EGD);  Surgeon: Rogene Houston, MD;  [facility];  Service: Endoscopy;  Laterality: N/A;  730  . ESOPHAGOGASTRODUODENOSCOPY N/A 06/21/2014   Procedure: ESOPHAGOGASTRODUODENOSCOPY (EGD);  Surgeon: Rogene Houston, MD;  [facility];  Service: Endoscopy;  Laterality: N/A;  930-rescheduled 10/14 @ 1200 Ann to notify pt  . EYE SURGERY  08   cataract surgery of the left eye  . FEMUR IM NAIL Left 03/02/2017   Procedure: INTRAMEDULLARY (IM) NAIL FEMORAL;   Surgeon: Rod Can, MD;  Location: [facility];  Service: Orthopedics;  Laterality: Left;  . HARDWARE REMOVAL Right 01/17/2013   Procedure: REMOVAL OF HARDWARE AND EXCISION ULNAR STYLOID RIGHT WRIST;  Surgeon: Tennis Must, MD;  Location: [facility];  Service: Orthopedics;  Laterality: Right;  . MYRINGOTOMY  2012   both ears  . ORIF FEMUR FRACTURE Left 04/23/2017   Procedure: OPEN REDUCTION INTERNAL FIXATION (ORIF) DISTAL FEMUR FRACTURE (FRACTURE AROUND FEMORAL NAIL);  Surgeon: Altamese [place], MD;  Location: [facility];  Service: Orthopedics;  Laterality: Left;  . TONSILLECTOMY    . VAGINAL HYSTERECTOMY  1972  . WRIST SURGERY     rt wrist hardwear removal    There were no vitals filed for this visit.  Subjective Assessment - 11/03/18 1442    Subjective  Patient reported some discomfort in her right hip, but not really pain.     Currently in Pain?  No/denies                       [facility] Adult PT Treatment/Exercise - 11/03/18 0001      Knee/Hip Exercises: Standing   Hip Abduction  Both;15 reps    Hip Extension  Both;15 reps    Lateral Step Up  Both;10 reps;Hand Hold: 1;Step Height: 4"    Forward Step Up  Both;10 reps;Hand Hold: 1;Step Height: 6"    Functional Squat  10 reps    SLS with Vectors  10X5" each LE with 1 HHA; Forward side and back    Other Standing Knee Exercises  Ladder drill forward to increase step length x 4 repetitions using RW    Other Standing Knee Exercises  tandem on foam 2X30" each LE lead (intermittent HHA)          Balance Exercises - 11/03/18 1503      Balance Exercises: Standing   Sit to Stand Time  10    Other Standing Exercises  NBOS standing with rotating head to the left/right and up/down x10 each.           PT Short Term Goals - 10/27/18 1526      PT SHORT TERM GOAL #1   Title  Patient will demonstrate understanding and report regular compliance with HEP to improve strength, balance and functional mobility.      Time  3    Period  Weeks    Status  Achieved      PT SHORT TERM GOAL #2   Title  Patient will report ability to ambulate for at least 20 minutes in order to go to the grocery store and pick out food without frequent rest breaks.    Time  3    Period  Weeks    Status  Achieved  PT Long Term Goals - 10/27/18 1526      PT LONG TERM GOAL #1   Title  Patient will demonstrate improvement of at least 1/2 MMT strength grade in all deficient hip and knee muscles in order to improve stability and balance and decrease patient's risk of falls.     Baseline  10/27/18: Patient achieved in some, but not all muscle groups.     Time  6    Period  Weeks    Status  On-going      PT LONG TERM GOAL #2   Title  Patient will demonstrate improvement of at least 10 seconds on the TUG indicating improved functional mobility and balance.     Baseline  10/27/18: Patient improved time on the TUG from 90 seconds to 73 seconds at this re-assessment.     Time  6    Period  Weeks    Status  Achieved      PT LONG TERM GOAL #3   Title  Patient will demonstrate improvement of at least 7 points on the [facility] Balance Scale indicating improved balance, safety, and decreased risk of falls.     Baseline  10/27/18: Patient improved by a couple points, but not by 7 points.     Time  6    Period  Weeks    Status  On-going            Plan - 11/03/18 1504    Clinical Impression Statement  This session continued with established plan of care. Focused on balance and increasing patient's step length. Added ambulation inside ladder to increase step length this session. Patient tolerated all exercises well. Patient would benefit from continued skilled physical therapy in order to continue progressing towards functional goals.     Rehab Potential  Good    Clinical Impairments Affecting Rehab Potential  Positive: Motivated; Negative: Length of time since patient's stroke    PT Frequency  3x / week    PT Duration  6  weeks    PT Treatment/Interventions  ADLs/Self Care Home Management;Aquatic Therapy;DME Instruction;Gait training;Stair training;Functional mobility training;Therapeutic activities;Therapeutic exercise;Balance training;Neuromuscular re-education;Patient/family education;Orthotic Fit/Training;Wheelchair mobility training;Manual techniques;Passive range of motion;Energy conservation;Taping    PT Next Visit Plan  Continue with focus on lower extremity strengthening and balance.     PT Home Exercise Plan  10/08/18: Constance Haw, seated marching, SLR, LAQ    Consulted and Agree with Plan of Care  Patient       Patient will benefit from skilled therapeutic intervention in order to improve the following deficits and impairments:  Abnormal gait, Decreased balance, Decreased endurance, Decreased mobility, Difficulty walking, Improper body mechanics, Decreased activity tolerance, Decreased strength, Postural dysfunction  Visit Diagnosis: Other symptoms and signs involving the nervous system  Muscle weakness (generalized)  Unsteadiness on feet  Other abnormalities of gait and mobility     Problem List Patient Active Problem List   Diagnosis Date Noted  . Tobacco use disorder 07/09/2018  . Small vessel disease ([place]) 07/09/2018  . Benign essential HTN   . Seizures ([place])   . Tachypnea   . Dysphagia, post-stroke   . Dysarthria 05/19/2018  . Syncope and collapse   . Syncope 08/24/2017  . Closed left femoral fracture ([place]) 04/21/2017  . Seizure disorder ([place]) 04/21/2017  . Closed pertrochanteric fracture of femur, left, initial encounter ([place]) 03/02/2017  . Seizure ([place]) 02/28/2017  . Hip fracture ([place]) 02/28/2017  . Chest pain 12/07/2016  . Atypical chest pain 12/07/2016  .  Tachycardia   . NSTEMI (non-ST elevated myocardial infarction) ([place]) 11/02/2016  . Multifocal atrial tachycardia ([place]) 09/25/2016  . Elevated troponin 09/24/2016  . Hypokalemia 09/23/2016  . TIA (transient ischemic attack)  05/05/2016  . Intractable nausea and vomiting 10/15/2015  . Acute coronary syndrome ([place]) 06/01/2015  . Bronchitis 06/01/2015  . Thrombocytopenia ([place]) 06/01/2015  . Cerebral infarction ([place]) right occipital due to small vessel disease s/p tPA 03/06/2014  . Lower urinary tract infectious disease   . PSVT (paroxysmal supraventricular tachycardia) ([place]) 12/09/2013  . Esophageal varices ([place]) 03/29/2013  . Hematemesis 03/29/2013  . Hepatic cirrhosis ([place]) 03/29/2013  . Presence of stent in right coronary artery 07/04/2011  . OSA (obstructive sleep apnea) 07/04/2011  . Aortic sclerosis 07/04/2011  . Aortic insufficiency 07/04/2011  . HTN (hypertension) 07/04/2011  . Carotid stenosis, bilateral 07/04/2011  . Chest pain at rest 07/03/2011  . CAD (coronary artery disease) 07/03/2011  . Hypercholesteremia 07/03/2011  . GERD (gastroesophageal reflux disease) 07/03/2011   Clarene Critchley PT, DPT 3:15 PM, 11/03/18 [place], [place], [address] Phone: [phone]   Fax:  [phone]  Name: Gabryel Talamo MRN: [id number] Date of Birth: 01-11-1941

## 2018-11-03 NOTE — Therapy (Signed)
[place], [place], [address] Phone: [phone]   Fax:  [phone]  Occupational Therapy Treatment  Patient Details  Name: [name] MRN: [id number] Date of Birth: [birth year] Referring Provider (OT): Dr. Alysia Penna   Encounter Date: 11/03/2018  OT End of Session - 11/03/18 1604    Visit Number  2    Number of Visits  8    Date for OT Re-Evaluation  11/26/18    Authorization Type  UHC Medicare    Authorization Time Period  visits based on medical necessity; $30 copay    OT Start Time  1518    OT Stop Time  1556    OT Time Calculation (min)  38 min    Activity Tolerance  Patient tolerated treatment well    Behavior During Therapy  [facility] for tasks assessed/performed       Past Medical History:  Diagnosis Date  . Anemia   . Aortic insufficiency    Moderate  . Back pain   . Carotid stenosis, bilateral   . Cirrhosis ([place])   . Closed left femoral fracture ([place]) 04/21/2017  . Coronary artery disease    Stent x 2 RCA 1995, cardiac catheterization 09/2016 showing only mild atherosclerosis  . DDD (degenerative disc disease), lumbar   . Essential hypertension   . Falls   . GERD (gastroesophageal reflux disease)   . Hiatal hernia   . History of stroke   . Hypercholesteremia   . IBS (irritable bowel syndrome)   . Non-alcoholic fatty liver disease   . OSA (obstructive sleep apnea)    no CPAP  . Pericardial effusion    a. small by echo 2018.  Marland Kitchen PSVT (paroxysmal supraventricular tachycardia) ([place])   . Recurrent UTI   . Seizures ([place])   . Stroke ([place])   . Type 2 diabetes mellitus ([place])    off medication  . Varices, esophageal ([place])     Past Surgical History:  Procedure Laterality Date  . [place]  . APPENDECTOMY    . BACK SURGERY  1985  . CARDIAC CATHETERIZATION  [phone]   Stent to the proximal RCA after MI   . CARDIAC CATHETERIZATION N/A 09/26/2016   Procedure: Left  Heart Cath and Coronary Angiography;  Surgeon: Leonie Man, MD;  Location: [facility];  Service: Cardiovascular;  Laterality: N/A;  . CATARACT EXTRACTION W/PHACO Right 07/04/2014   Procedure: CATARACT EXTRACTION PHACO AND INTRAOCULAR LENS PLACEMENT (IOC);  Surgeon: Elta Guadeloupe T. Gershon Crane, MD;  [facility]: [facility];  Service: Ophthalmology;  Laterality: Right;  CDE:13.13  . COLONOSCOPY    . DILATION AND CURETTAGE OF UTERUS     x2  . Epi Retinal Membrane Peel Left   . ERCP    . ESOPHAGEAL BANDING N/A 04/01/2013   Procedure: ESOPHAGEAL BANDING;  Surgeon: Rogene Houston, MD;  [facility];  Service: Endoscopy;  Laterality: N/A;  . ESOPHAGEAL BANDING N/A 05/24/2013   Procedure: ESOPHAGEAL BANDING;  Surgeon: Rogene Houston, MD;  [facility];  Service: Endoscopy;  Laterality: N/A;  . ESOPHAGEAL BANDING N/A 06/21/2014   Procedure: ESOPHAGEAL BANDING;  Surgeon: Rogene Houston, MD;  [facility];  Service: Endoscopy;  Laterality: N/A;  . ESOPHAGOGASTRODUODENOSCOPY N/A 04/01/2013   Procedure: ESOPHAGOGASTRODUODENOSCOPY (EGD);  Surgeon: Rogene Houston, MD;  [facility];  Service: Endoscopy;  Laterality: N/A;  230-rescheduled to 8:30am Ann notified pt  . ESOPHAGOGASTRODUODENOSCOPY  N/A 05/24/2013   Procedure: ESOPHAGOGASTRODUODENOSCOPY (EGD);  Surgeon: Rogene Houston, MD;  [facility];  Service: Endoscopy;  Laterality: N/A;  730  . ESOPHAGOGASTRODUODENOSCOPY N/A 06/21/2014   Procedure: ESOPHAGOGASTRODUODENOSCOPY (EGD);  Surgeon: Rogene Houston, MD;  [facility];  Service: Endoscopy;  Laterality: N/A;  930-rescheduled 10/14 @ 1200 Ann to notify pt  . EYE SURGERY  08   cataract surgery of the left eye  . FEMUR IM NAIL Left 03/02/2017   Procedure: INTRAMEDULLARY (IM) NAIL FEMORAL;  Surgeon: Rod Can, MD;  Location: [facility];  Service: Orthopedics;  Laterality: Left;  . HARDWARE REMOVAL Right 01/17/2013   Procedure: REMOVAL OF HARDWARE  AND EXCISION ULNAR STYLOID RIGHT WRIST;  Surgeon: Tennis Must, MD;  Location: [facility];  Service: Orthopedics;  Laterality: Right;  . MYRINGOTOMY  2012   both ears  . ORIF FEMUR FRACTURE Left 04/23/2017   Procedure: OPEN REDUCTION INTERNAL FIXATION (ORIF) DISTAL FEMUR FRACTURE (FRACTURE AROUND FEMORAL NAIL);  Surgeon: Altamese [place], MD;  Location: [facility];  Service: Orthopedics;  Laterality: Left;  . TONSILLECTOMY    . VAGINAL HYSTERECTOMY  1972  . WRIST SURGERY     rt wrist hardwear removal    There were no vitals filed for this visit.  Subjective Assessment - 11/03/18 1519    Subjective   S: I was really hurting after that last time.    Currently in Pain?  Yes    Pain Score  2     Pain Location  Wrist    Pain Orientation  Right    Pain Descriptors / Indicators  Aching;Sore    Pain Type  Acute pain    Pain Radiating Towards  sometimes to the elbow    Pain Onset  More than a month ago    Pain Frequency  Intermittent    Aggravating Factors   movement, use    Pain Relieving Factors  rest    Effect of Pain on Daily Activities  min/mod effect on ADL completion    Multiple Pain Sites  No                   OT Treatments/Exercises (OP) - 11/03/18 1528      Exercises   Exercises  Wrist;Hand;Theraputty      Weighted Stretch Over Towel Roll   Supination - Weighted Stretch  1 pound;30 seconds   2 reps   Wrist Flexion - Weighted Stretch  1 pound;30 seconds   2 reps   Wrist Extension - Weighted Stretch  1 pound;30 seconds   2 reps     Wrist Exercises   Wrist Flexion  PROM;10 reps;AROM;15 reps    Wrist Extension  PROM;10 reps;AROM;15 reps    Wrist Radial Deviation  PROM;10 reps;AROM;15 reps    Wrist Ulnar Deviation  PROM;10 reps;AROM;15 reps    Other wrist exercises  supination, pronation, A/ROM, 12X each    Other wrist exercises  pt used pvc pipe to cut circles into yellow putty, working on wrist flexion and extension      Theraputty   Theraputty -  Flatten  yellow working on wrist extension    Theraputty - Roll  yellow    Theraputty - Grip  yellow               OT Short Term Goals - 11/03/18 1547      OT SHORT TERM GOAL #1   Title  Pt will be educated on  HEP to improve right wrist mobility required for ADL completion.     Time  4    Period  Weeks    Status  On-going    Target Date  11/26/18      OT SHORT TERM GOAL #2   Title  Pt will decrease right wrist pain to 3/10 or less to improve ability to support herself comfortably when using her walker.     Time  4    Period  Weeks    Status  On-going      OT SHORT TERM GOAL #3   Title  Pt will increase right wrist A/ROM to [facility] to improve ability to curl her hair.     Time  4    Period  Weeks    Status  On-going      OT SHORT TERM GOAL #4   Title  Pt will increase right wrist strength to improve ability to maintain hold on weighted items.     Time  4    Period  Weeks    Status  On-going      OT SHORT TERM GOAL #5   Title  Pt will improve right pinch strength by 2# to increase ability to hold onto tools and utensils during ADLs.     Time  4    Period  Weeks    Status  On-going               Plan - 11/03/18 1548    Clinical Impression Statement  A: Pt reporting pain and soreness after evaluation, decreased now, pt completing HEP daily. Initiated P/ROM, A/ROM, and theraputty exercises for the wrist. Also completed sustained wrist stretches.  Pt with slightly improved extension today. Pt reporting minimal soreness during exercises, verbal cuing for form and technique.     Plan  P: Continue with wrist stretches and add to HEP, add pinch strengthening task       Patient will benefit from skilled therapeutic intervention in order to improve the following deficits and impairments:  Decreased range of motion, Impaired flexibility, Decreased activity tolerance, Increased fascial restrictions, Pain, Impaired UE functional use, Decreased strength  Visit  Diagnosis: Other symptoms and signs involving the musculoskeletal system  Stiffness of right wrist, not elsewhere classified  Pain in right wrist    Problem List Patient Active Problem List   Diagnosis Date Noted  . Tobacco use disorder 07/09/2018  . Small vessel disease ([place]) 07/09/2018  . Benign essential HTN   . Seizures ([place])   . Tachypnea   . Dysphagia, post-stroke   . Dysarthria 05/19/2018  . Syncope and collapse   . Syncope 08/24/2017  . Closed left femoral fracture ([place]) 04/21/2017  . Seizure disorder ([place]) 04/21/2017  . Closed pertrochanteric fracture of femur, left, initial encounter ([place]) 03/02/2017  . Seizure ([place]) 02/28/2017  . Hip fracture ([place]) 02/28/2017  . Chest pain 12/07/2016  . Atypical chest pain 12/07/2016  . Tachycardia   . NSTEMI (non-ST elevated myocardial infarction) ([place]) 11/02/2016  . Multifocal atrial tachycardia ([place]) 09/25/2016  . Elevated troponin 09/24/2016  . Hypokalemia 09/23/2016  . TIA (transient ischemic attack) 05/05/2016  . Intractable nausea and vomiting 10/15/2015  . Acute coronary syndrome ([place]) 06/01/2015  . Bronchitis 06/01/2015  . Thrombocytopenia ([place]) 06/01/2015  . Cerebral infarction ([place]) right occipital due to small vessel disease s/p tPA 03/06/2014  . Lower urinary tract infectious disease   . PSVT (paroxysmal supraventricular tachycardia) ([place]) 12/09/2013  . Esophageal varices ([facility])  03/29/2013  . Hematemesis 03/29/2013  . Hepatic cirrhosis ([place]) 03/29/2013  . Presence of stent in right coronary artery 07/04/2011  . OSA (obstructive sleep apnea) 07/04/2011  . Aortic sclerosis 07/04/2011  . Aortic insufficiency 07/04/2011  . HTN (hypertension) 07/04/2011  . Carotid stenosis, bilateral 07/04/2011  . Chest pain at rest 07/03/2011  . CAD (coronary artery disease) 07/03/2011  . Hypercholesteremia 07/03/2011  . GERD (gastroesophageal reflux disease) 07/03/2011   Guadelupe Sabin, OTR/L  [phone] 11/03/2018, 4:05  PM  [place], [place], [address] Phone: [phone]   Fax:  [phone]  Name: [name] MRN: [id number] Date of Birth: July 20, 1941

## 2018-11-05 ENCOUNTER — Ambulatory Visit (HOSPITAL_COMMUNITY): Payer: Medicare Other | Admitting: Occupational Therapy

## 2018-11-05 ENCOUNTER — Encounter (HOSPITAL_COMMUNITY): Payer: Self-pay | Admitting: Occupational Therapy

## 2018-11-05 ENCOUNTER — Ambulatory Visit (HOSPITAL_COMMUNITY): Payer: Medicare Other

## 2018-11-05 DIAGNOSIS — M25631 Stiffness of right wrist, not elsewhere classified: Secondary | ICD-10-CM | POA: Diagnosis not present

## 2018-11-05 DIAGNOSIS — R2681 Unsteadiness on feet: Secondary | ICD-10-CM

## 2018-11-05 DIAGNOSIS — M6281 Muscle weakness (generalized): Secondary | ICD-10-CM | POA: Diagnosis not present

## 2018-11-05 DIAGNOSIS — R29818 Other symptoms and signs involving the nervous system: Secondary | ICD-10-CM

## 2018-11-05 DIAGNOSIS — M25531 Pain in right wrist: Secondary | ICD-10-CM | POA: Diagnosis not present

## 2018-11-05 DIAGNOSIS — R29898 Other symptoms and signs involving the musculoskeletal system: Secondary | ICD-10-CM

## 2018-11-05 DIAGNOSIS — R2689 Other abnormalities of gait and mobility: Secondary | ICD-10-CM | POA: Diagnosis not present

## 2018-11-05 NOTE — Therapy (Signed)
[place], [place], [address] Phone: [phone]   Fax:  [phone]  Physical Therapy Treatment  Patient Details  Name: [name] MRN: [id number] Date of Birth: 1940/12/31 Referring Provider (PT): Letta Pate Luanna Salk, MD   Encounter Date: 11/05/2018  PT End of Session - 11/05/18 1254    Visit Number  13    Number of Visits  19    Date for PT Re-Evaluation  11/17/18   [place]ass[place] completed 10/27/18   Authorization Type  UHC Medicare    Authorization Time Period  10/06/18 - 11/17/18    Authorization - Visit Number  13    Authorization - Number of Visits  19    PT Start Time  1300    PT Stop Time  1342    PT Time Calculation (min)  42 min    Equipment Utilized During Treatment  Gait belt    Activity Tolerance  Patient tolerated treatment well;No increased pain    Behavior During Therapy  WFL for tasks assessed/performed       Past Medical History:  Diagnosis Date  . Anemia   . Aortic insufficiency    Moderate  . Back pain   . Carotid stenosis, bilateral   . Cirrhosis ([place])   . Closed left femoral fracture ([place]) 04/21/2017  . Coronary artery disease    Stent x 2 RCA 1995, cardiac catheterization 09/2016 showing only mild atherosclerosis  . DDD (degenerative disc disease), lumbar   . Essential hypertension   . Falls   . GERD (gastroesophageal reflux disease)   . Hiatal hernia   . History of stroke   . Hypercholesteremia   . IBS (irritable bowel syndrome)   . Non-alcoholic fatty liver disease   . OSA (obstructive sleep apnea)    no CPAP  . Pericardial effusion    a. small by echo 2018.  Marland Kitchen PSVT (paroxysmal supraventricular tachycardia) ([place])   . Recurrent UTI   . Seizures ([place])   . Stroke ([place])   . Type 2 diabetes mellitus ([place])    off medication  . Varices, esophageal ([place])     Past Surgical History:  Procedure Laterality Date  . [place]  . APPENDECTOMY    . BACK  SURGERY  1985  . CARDIAC CATHETERIZATION  [phone]   Stent to the proximal RCA after MI   . CARDIAC CATHETERIZATION N/A 09/26/2016   Procedure: Left Heart Cath and Coronary Angiography;  Surgeon: Leonie Man, MD;  Location: [facility];  Service: Cardiovascular;  Laterality: N/A;  . CATARACT EXTRACTION W/PHACO Right 07/04/2014   Procedure: CATARACT EXTRACTION PHACO AND INTRAOCULAR LENS PLACEMENT (IOC);  Surgeon: Elta Guadeloupe T. Gershon Crane, MD;  [facility]: [facility];  Service: Ophthalmology;  Laterality: Right;  CDE:13.13  . COLONOSCOPY    . DILATION AND CURETTAGE OF UTERUS     x2  . Epi Retinal Membrane Peel Left   . ERCP    . ESOPHAGEAL BANDING N/A 04/01/2013   Procedure: ESOPHAGEAL BANDING;  Surgeon: Rogene Houston, MD;  [facility];  Service: Endoscopy;  Laterality: N/A;  . ESOPHAGEAL BANDING N/A 05/24/2013   Procedure: ESOPHAGEAL BANDING;  Surgeon: Rogene Houston, MD;  [facility];  Service: Endoscopy;  Laterality: N/A;  . ESOPHAGEAL BANDING N/A 06/21/2014   Procedure: ESOPHAGEAL BANDING;  Surgeon: Rogene Houston, MD;  [facility];  Service: Endoscopy;  Laterality: N/A;  . ESOPHAGOGASTRODUODENOSCOPY N/A 04/01/2013  Procedure: ESOPHAGOGASTRODUODENOSCOPY (EGD);  Surgeon: Rogene Houston, MD;  [facility];  Service: Endoscopy;  Laterality: N/A;  230-rescheduled to 8:30am Ann notified pt  . ESOPHAGOGASTRODUODENOSCOPY N/A 05/24/2013   Procedure: ESOPHAGOGASTRODUODENOSCOPY (EGD);  Surgeon: Rogene Houston, MD;  [facility];  Service: Endoscopy;  Laterality: N/A;  730  . ESOPHAGOGASTRODUODENOSCOPY N/A 06/21/2014   Procedure: ESOPHAGOGASTRODUODENOSCOPY (EGD);  Surgeon: Rogene Houston, MD;  [facility];  Service: Endoscopy;  Laterality: N/A;  930-rescheduled 10/14 @ 1200 Ann to notify pt  . EYE SURGERY  08   cataract surgery of the left eye  . FEMUR IM NAIL Left 03/02/2017   Procedure: INTRAMEDULLARY (IM) NAIL FEMORAL;   Surgeon: Rod Can, MD;  Location: [facility];  Service: Orthopedics;  Laterality: Left;  . HARDWARE REMOVAL Right 01/17/2013   Procedure: REMOVAL OF HARDWARE AND EXCISION ULNAR STYLOID RIGHT WRIST;  Surgeon: Tennis Must, MD;  Location: [facility];  Service: Orthopedics;  Laterality: Right;  . MYRINGOTOMY  2012   both ears  . ORIF FEMUR FRACTURE Left 04/23/2017   Procedure: OPEN REDUCTION INTERNAL FIXATION (ORIF) DISTAL FEMUR FRACTURE (FRACTURE AROUND FEMORAL NAIL);  Surgeon: Altamese Rahway, MD;  Location: [facility];  Service: Orthopedics;  Laterality: Left;  . TONSILLECTOMY    . VAGINAL HYSTERECTOMY  1972  . WRIST SURGERY     rt wrist hardwear removal    There were no vitals filed for this visit.  Subjective Assessment - 11/05/18 1254    Subjective  Patient reported some discomfort in her right hip, but not really pain.                        [facility] Adult PT Treatment/Exercise - 11/05/18 0001      Knee/Hip Exercises: Standing   Hip Abduction  Both;15 reps    Hip Extension  Both;15 reps    Lateral Step Up  Both;10 reps;Hand Hold: 1;Step Height: 4"    Forward Step Up  Both;10 reps;Hand Hold: 1;Step Height: 6"    Functional Squat  10 reps    SLS with Vectors  10X5" each LE with 1 HHA; Forward side and back    Other Standing Knee Exercises  Ladder drill forward to increase step length x 4 repetitions using RW    Other Standing Knee Exercises  tandem on foam 2X30" each LE lead (intermittent HHA)      Knee/Hip Exercises: Seated   Sit to Sand  10 reps;without UE support               PT Short Term Goals - 11/05/18 1255      PT SHORT TERM GOAL #1   Title  Patient will demonstrate understanding and report regular compliance with HEP to improve strength, balance and functional mobility.     Time  3    Period  Weeks    Status  Achieved      PT SHORT TERM GOAL #2   Title  Patient will report ability to ambulate for at least 20 minutes in order to  go to the grocery store and pick out food without frequent rest breaks.    Time  3    Period  Weeks    Status  Achieved        PT Long Term Goals - 11/05/18 1502      PT LONG TERM GOAL #1   Title  Patient will demonstrate improvement of at least  1/2 MMT strength grade in all deficient hip and knee muscles in order to improve stability and balance and decrease patient's risk of falls.     Baseline  10/27/18: Patient achieved in some, but not all muscle groups.     Time  6    Period  Weeks    Status  On-going      PT LONG TERM GOAL #2   Title  Patient will demonstrate improvement of at least 10 seconds on the TUG indicating improved functional mobility and balance.     Baseline  10/27/18: Patient improved time on the TUG from 90 seconds to 73 seconds at this re-assessment.     Time  6    Period  Weeks    Status  Achieved      PT LONG TERM GOAL #3   Title  Patient will demonstrate improvement of at least 7 points on the [facility] Balance Scale indicating improved balance, safety, and decreased risk of falls.     Baseline  10/27/18: Patient improved by a couple points, but not by 7 points.     Time  6    Period  Weeks    Status  On-going            Plan - 11/05/18 1255    Clinical Impression Statement  This session continued with established plan of care. Focused on balance and increasing patient's step length. Continued with ambulation inside ladder to increase step length this session. Left leg shorter than right in standing. Recommended patient add 3/8" heel lift in left shoe to improve posture and balance left to right. Patient tolerated all exercises well. Patient would benefit from continued skilled physical therapy in order to continue progressing towards functional goals.     Rehab Potential  Good    Clinical Impairments Affecting Rehab Potential  Positive: Motivated; Negative: Length of time since patient's stroke    PT Frequency  3x / week    PT Duration  6 weeks    PT  Treatment/Interventions  ADLs/Self Care Home Management;Aquatic Therapy;DME Instruction;Gait training;Stair training;Functional mobility training;Therapeutic activities;Therapeutic exercise;Balance training;Neuromuscular re-education;Patient/family education;Orthotic Fit/Training;Wheelchair mobility training;Manual techniques;Passive range of motion;Energy conservation;Taping    PT Next Visit Plan  Continue with focus on lower extremity strengthening and balance.     PT Home Exercise Plan  10/08/18: Constance Haw, seated marching, SLR, LAQ    Consulted and Agree with Plan of Care  Patient       Patient will benefit from skilled therapeutic intervention in order to improve the following deficits and impairments:  Abnormal gait, Decreased balance, Decreased endurance, Decreased mobility, Difficulty walking, Improper body mechanics, Decreased activity tolerance, Decreased strength, Postural dysfunction  Visit Diagnosis: Other abnormalities of gait and mobility  Unsteadiness on feet  Muscle weakness (generalized)  Other symptoms and signs involving the nervous system     Problem List Patient Active Problem List   Diagnosis Date Noted  . Tobacco use disorder 07/09/2018  . Small vessel disease ([place]) 07/09/2018  . Benign essential HTN   . Seizures ([place])   . Tachypnea   . Dysphagia, post-stroke   . Dysarthria 05/19/2018  . Syncope and collapse   . Syncope 08/24/2017  . Closed left femoral fracture ([place]) 04/21/2017  . Seizure disorder ([place]) 04/21/2017  . Closed pertrochanteric fracture of femur, left, initial encounter ([place]) 03/02/2017  . Seizure ([place]) 02/28/2017  . Hip fracture ([place]) 02/28/2017  . Chest pain 12/07/2016  . Atypical chest pain 12/07/2016  . Tachycardia   .  NSTEMI (non-ST elevated myocardial infarction) ([place]) 11/02/2016  . Multifocal atrial tachycardia ([place]) 09/25/2016  . Elevated troponin 09/24/2016  . Hypokalemia 09/23/2016  . TIA (transient ischemic attack) 05/05/2016   . Intractable nausea and vomiting 10/15/2015  . Acute coronary syndrome ([place]) 06/01/2015  . Bronchitis 06/01/2015  . Thrombocytopenia ([place]) 06/01/2015  . Cerebral infarction ([place]) right occipital due to small vessel disease s/p tPA 03/06/2014  . Lower urinary tract infectious disease   . PSVT (paroxysmal supraventricular tachycardia) ([place]) 12/09/2013  . Esophageal varices ([place]) 03/29/2013  . Hematemesis 03/29/2013  . Hepatic cirrhosis ([place]) 03/29/2013  . Presence of stent in right coronary artery 07/04/2011  . OSA (obstructive sleep apnea) 07/04/2011  . Aortic sclerosis 07/04/2011  . Aortic insufficiency 07/04/2011  . HTN (hypertension) 07/04/2011  . Carotid stenosis, bilateral 07/04/2011  . Chest pain at rest 07/03/2011  . CAD (coronary artery disease) 07/03/2011  . Hypercholesteremia 07/03/2011  . GERD (gastroesophageal reflux disease) 07/03/2011    Floria Raveling. Hartnett-Rands, MS, PT [facility] [place] [phone] 11/05/2018, 3:03 PM  [facility] [address] [place], [place], [address] Phone: [phone]   Fax:  [phone]  Name: Cypress Hinkson MRN: [id number] Date of Birth: [birth year]

## 2018-11-05 NOTE — Therapy (Signed)
[place], [place], [address] Phone: [phone]   Fax:  [phone]  Occupational Therapy Treatment  Patient Details  Name: [name] MRN: [id number] Date of Birth: Jun 23, 1941 Referring Provider (OT): Dr. Alysia Penna   Encounter Date: 11/05/2018  OT End of Session - 11/05/18 1605    Visit Number  3    Number of Visits  8    Date for OT Re-Evaluation  11/26/18    Authorization Type  UHC Medicare    Authorization Time Period  visits based on medical necessity; $30 copay    OT Start Time  1345    OT Stop Time  1425    OT Time Calculation (min)  40 min    Activity Tolerance  Patient tolerated treatment well    Behavior During Therapy  [facility] for tasks assessed/performed       Past Medical History:  Diagnosis Date  . Anemia   . Aortic insufficiency    Moderate  . Back pain   . Carotid stenosis, bilateral   . Cirrhosis ([place])   . Closed left femoral fracture ([place]) 04/21/2017  . Coronary artery disease    Stent x 2 RCA 1995, cardiac catheterization 09/2016 showing only mild atherosclerosis  . DDD (degenerative disc disease), lumbar   . Essential hypertension   . Falls   . GERD (gastroesophageal reflux disease)   . Hiatal hernia   . History of stroke   . Hypercholesteremia   . IBS (irritable bowel syndrome)   . Non-alcoholic fatty liver disease   . OSA (obstructive sleep apnea)    no CPAP  . Pericardial effusion    a. small by echo 2018.  Marland Kitchen PSVT (paroxysmal supraventricular tachycardia) ([place])   . Recurrent UTI   . Seizures ([place])   . Stroke ([place])   . Type 2 diabetes mellitus ([place])    off medication  . Varices, esophageal ([place])     Past Surgical History:  Procedure Laterality Date  . [place]  . APPENDECTOMY    . BACK SURGERY  1985  . CARDIAC CATHETERIZATION  [phone]   Stent to the proximal RCA after MI   . CARDIAC CATHETERIZATION N/A 09/26/2016   Procedure: Left  Heart Cath and Coronary Angiography;  Surgeon: Leonie Man, MD;  Location: [place] [facility];  Service: Cardiovascular;  Laterality: N/A;  . CATARACT EXTRACTION W/PHACO Right 07/04/2014   Procedure: CATARACT EXTRACTION PHACO AND INTRAOCULAR LENS PLACEMENT (IOC);  Surgeon: Elta Guadeloupe T. Gershon Crane, MD;  [facility];  Service: Ophthalmology;  Laterality: Right;  CDE:13.13  . COLONOSCOPY    . DILATION AND CURETTAGE OF UTERUS     x2  . Epi Retinal Membrane Peel Left   . ERCP    . ESOPHAGEAL BANDING N/A 04/01/2013   Procedure: ESOPHAGEAL BANDING;  Surgeon: Rogene Houston, MD;  [facility];  Service: Endoscopy;  Laterality: N/A;  . ESOPHAGEAL BANDING N/A 05/24/2013   Procedure: ESOPHAGEAL BANDING;  Surgeon: Rogene Houston, MD;  [facility];  Service: Endoscopy;  Laterality: N/A;  . ESOPHAGEAL BANDING N/A 06/21/2014   Procedure: ESOPHAGEAL BANDING;  Surgeon: Rogene Houston, MD;  [facility];  Service: Endoscopy;  Laterality: N/A;  . ESOPHAGOGASTRODUODENOSCOPY N/A 04/01/2013   Procedure: ESOPHAGOGASTRODUODENOSCOPY (EGD);  Surgeon: Rogene Houston, MD;  [facility];  Service: Endoscopy;  Laterality: N/A;  230-rescheduled to 8:30am Ann notified pt  . ESOPHAGOGASTRODUODENOSCOPY  N/A 05/24/2013   Procedure: ESOPHAGOGASTRODUODENOSCOPY (EGD);  Surgeon: Rogene Houston, MD;  [facility];  Service: Endoscopy;  Laterality: N/A;  730  . ESOPHAGOGASTRODUODENOSCOPY N/A 06/21/2014   Procedure: ESOPHAGOGASTRODUODENOSCOPY (EGD);  Surgeon: Rogene Houston, MD;  [facility];  Service: Endoscopy;  Laterality: N/A;  930-rescheduled 10/14 @ 1200 Ann to notify pt  . EYE SURGERY  08   cataract surgery of the left eye  . FEMUR IM NAIL Left 03/02/2017   Procedure: INTRAMEDULLARY (IM) NAIL FEMORAL;  Surgeon: Rod Can, MD;  Location: [place];  Service: Orthopedics;  Laterality: Left;  . HARDWARE REMOVAL Right 01/17/2013   Procedure: REMOVAL OF HARDWARE  AND EXCISION ULNAR STYLOID RIGHT WRIST;  Surgeon: Tennis Must, MD;  Location: [place];  Service: Orthopedics;  Laterality: Right;  . MYRINGOTOMY  2012   both ears  . ORIF FEMUR FRACTURE Left 04/23/2017   Procedure: OPEN REDUCTION INTERNAL FIXATION (ORIF) DISTAL FEMUR FRACTURE (FRACTURE AROUND FEMORAL NAIL);  Surgeon: Altamese [place], MD;  Location: [place];  Service: Orthopedics;  Laterality: Left;  . TONSILLECTOMY    . VAGINAL HYSTERECTOMY  1972  . WRIST SURGERY     rt wrist hardwear removal    There were no vitals filed for this visit.  Subjective Assessment - 11/05/18 1346    Subjective   S: It's been hurting more since last session.     Currently in Pain?  Yes    Pain Score  3     Pain Location  Shoulder    Pain Orientation  Right    Pain Descriptors / Indicators  Aching;Sore    Pain Type  Acute pain    Pain Radiating Towards  sometimes to elbow    Pain Onset  More than a month ago    Pain Frequency  Intermittent    Aggravating Factors   movement, use    Pain Relieving Factors  rest    Effect of Pain on Daily Activities  min/mod effect on ADLs    Multiple Pain Sites  No         [facility] OT Assessment - 11/05/18 1346      Assessment   Medical Diagnosis  right wrist pain      Precautions   Precautions  Fall               OT Treatments/Exercises (OP) - 11/05/18 1356      Exercises   Exercises  Wrist;Hand;Theraputty      Weighted Stretch Over Towel Roll   Supination - Weighted Stretch  1 pound;30 seconds   2 reps   Wrist Flexion - Weighted Stretch  1 pound;30 seconds   2 reps   Wrist Extension - Weighted Stretch  1 pound;30 seconds   2 reps     Wrist Exercises   Wrist Flexion  PROM;10 reps;AROM;15 reps    Wrist Extension  PROM;10 reps;AROM;15 reps    Wrist Radial Deviation  PROM;10 reps;AROM;15 reps    Wrist Ulnar Deviation  PROM;10 reps;AROM;15 reps    Other wrist exercises  supination, pronation, A/ROM, 12X each    Other wrist  exercises  Pt using velcro board (narrow strip) to roll up and down strip working on wrist flexion/extension. 2x each direction      Additional Wrist Exercises   Sponges  Pt used red clothespin to grasp and stack one pile of 5 sponges, min difficulty. Then transitioned to green clothespin and stacked 3 piles of  5 sponges, mod difficulty using 3 point pinch.     Hand Gripper with Large Beads  all beads gripper at 22#    Hand Gripper with Medium Beads  all beads gripper at 22#      Theraputty   Theraputty - Pinch  yellow-lateral and 3 point               OT Short Term Goals - 11/03/18 1547      OT SHORT TERM GOAL #1   Title  Pt will be educated on HEP to improve right wrist mobility required for ADL completion.     Time  4    Period  Weeks    Status  On-going    Target Date  11/26/18      OT SHORT TERM GOAL #2   Title  Pt will decrease right wrist pain to 3/10 or less to improve ability to support herself comfortably when using her walker.     Time  4    Period  Weeks    Status  On-going      OT SHORT TERM GOAL #3   Title  Pt will increase right wrist A/ROM to [facility] to improve ability to curl her hair.     Time  4    Period  Weeks    Status  On-going      OT SHORT TERM GOAL #4   Title  Pt will increase right wrist strength to improve ability to maintain hold on weighted items.     Time  4    Period  Weeks    Status  On-going      OT SHORT TERM GOAL #5   Title  Pt will improve right pinch strength by 2# to increase ability to hold onto tools and utensils during ADLs.     Time  4    Period  Weeks    Status  On-going               Plan - 11/05/18 1605    Clinical Impression Statement  A: Pt reporting slightly increased pain and soreness since last session, does feel like she is gaining more movement in her wrist. Continued with A/ROM and weighted wrist stretches. Added velcro board, hand gripper, and pinch activities this session. Verbal cuing for form and  technique.     Plan  P: continue with A/ROM, increase wrist stretches to 60" each       Patient will benefit from skilled therapeutic intervention in order to improve the following deficits and impairments:  Decreased range of motion, Impaired flexibility, Decreased activity tolerance, Increased fascial restrictions, Pain, Impaired UE functional use, Decreased strength  Visit Diagnosis: Other symptoms and signs involving the musculoskeletal system  Stiffness of right wrist, not elsewhere classified  Pain in right wrist    Problem List Patient Active Problem List   Diagnosis Date Noted  . Tobacco use disorder 07/09/2018  . Small vessel disease ([place]) 07/09/2018  . Benign essential HTN   . Seizures ([place])   . Tachypnea   . Dysphagia, post-stroke   . Dysarthria 05/19/2018  . Syncope and collapse   . Syncope 08/24/2017  . Closed left femoral fracture ([place]) 04/21/2017  . Seizure disorder ([place]) 04/21/2017  . Closed pertrochanteric fracture of femur, left, initial encounter ([place]) 03/02/2017  . Seizure ([place]) 02/28/2017  . Hip fracture ([place]) 02/28/2017  . Chest pain 12/07/2016  . Atypical chest pain 12/07/2016  . Tachycardia   . NSTEMI (  non-ST elevated myocardial infarction) ([place]) 11/02/2016  . Multifocal atrial tachycardia ([place]) 09/25/2016  . Elevated troponin 09/24/2016  . Hypokalemia 09/23/2016  . TIA (transient ischemic attack) 05/05/2016  . Intractable nausea and vomiting 10/15/2015  . Acute coronary syndrome ([place]) 06/01/2015  . Bronchitis 06/01/2015  . Thrombocytopenia ([place]) 06/01/2015  . Cerebral infarction ([place]) right occipital due to small vessel disease s/p tPA 03/06/2014  . Lower urinary tract infectious disease   . PSVT (paroxysmal supraventricular tachycardia) ([place]) 12/09/2013  . Esophageal varices ([place]) 03/29/2013  . Hematemesis 03/29/2013  . Hepatic cirrhosis ([place]) 03/29/2013  . Presence of stent in right coronary artery 07/04/2011  . OSA (obstructive sleep  apnea) 07/04/2011  . Aortic sclerosis 07/04/2011  . Aortic insufficiency 07/04/2011  . HTN (hypertension) 07/04/2011  . Carotid stenosis, bilateral 07/04/2011  . Chest pain at rest 07/03/2011  . CAD (coronary artery disease) 07/03/2011  . Hypercholesteremia 07/03/2011  . GERD (gastroesophageal reflux disease) 07/03/2011   Guadelupe Sabin, OTR/L  [phone] 11/05/2018, 4:07 PM  [facility] [address] [place], [place], [address] Phone: [phone]   Fax:  [phone]  Name: [name] MRN: [id number] Date of Birth: 18-Feb-1941

## 2018-11-08 ENCOUNTER — Ambulatory Visit (HOSPITAL_COMMUNITY): Payer: Medicare Other | Attending: Physical Medicine & Rehabilitation

## 2018-11-08 DIAGNOSIS — R29818 Other symptoms and signs involving the nervous system: Secondary | ICD-10-CM | POA: Diagnosis not present

## 2018-11-08 DIAGNOSIS — R29898 Other symptoms and signs involving the musculoskeletal system: Secondary | ICD-10-CM | POA: Diagnosis not present

## 2018-11-08 DIAGNOSIS — M25531 Pain in right wrist: Secondary | ICD-10-CM | POA: Insufficient documentation

## 2018-11-08 DIAGNOSIS — M25631 Stiffness of right wrist, not elsewhere classified: Secondary | ICD-10-CM | POA: Diagnosis not present

## 2018-11-08 DIAGNOSIS — R2681 Unsteadiness on feet: Secondary | ICD-10-CM | POA: Insufficient documentation

## 2018-11-08 DIAGNOSIS — R2689 Other abnormalities of gait and mobility: Secondary | ICD-10-CM | POA: Diagnosis not present

## 2018-11-08 DIAGNOSIS — M6281 Muscle weakness (generalized): Secondary | ICD-10-CM | POA: Insufficient documentation

## 2018-11-08 NOTE — Therapy (Signed)
[place] [address] [place], [place], [address] Phone: [phone]   Fax:  [phone]  Physical Therapy Treatment  Patient Details  Name: [name] MRN: [id number] Date of Birth: [birth year] Referring Provider (PT): Letta Pate Luanna Salk, MD   Encounter Date: 11/08/2018  PT End of Session - 11/08/18 1801    Visit Number  14    Number of Visits  19    Date for PT Re-Evaluation  11/17/18   Minireassess completed 10/27/18   Authorization Type  UHC Medicare    Authorization Time Period  10/06/18 - 11/17/18    Authorization - Visit Number  14    Authorization - Number of Visits  19    PT Start Time  8546   pt late   PT Stop Time  2703    PT Time Calculation (min)  35 min    Equipment Utilized During Treatment  Gait belt    Activity Tolerance  Patient tolerated treatment well;No increased pain    Behavior During Therapy  WFL for tasks assessed/performed       Past Medical History:  Diagnosis Date  . Anemia   . Aortic insufficiency    Moderate  . Back pain   . Carotid stenosis, bilateral   . Cirrhosis ([place])   . Closed left femoral fracture ([place]) 04/21/2017  . Coronary artery disease    Stent x 2 RCA 1995, cardiac catheterization 09/2016 showing only mild atherosclerosis  . DDD (degenerative disc disease), lumbar   . Essential hypertension   . Falls   . GERD (gastroesophageal reflux disease)   . Hiatal hernia   . History of stroke   . Hypercholesteremia   . IBS (irritable bowel syndrome)   . Non-alcoholic fatty liver disease   . OSA (obstructive sleep apnea)    no CPAP  . Pericardial effusion    a. small by echo 2018.  Marland Kitchen PSVT (paroxysmal supraventricular tachycardia) ([place])   . Recurrent UTI   . Seizures ([place])   . Stroke ([place])   . Type 2 diabetes mellitus ([place])    off medication  . Varices, esophageal ([place])     Past Surgical History:  Procedure Laterality Date  . [place]  . APPENDECTOMY    .  BACK SURGERY  1985  . CARDIAC CATHETERIZATION  [phone]   Stent to the proximal RCA after MI   . CARDIAC CATHETERIZATION N/A 09/26/2016   Procedure: Left Heart Cath and Coronary Angiography;  Surgeon: Leonie Man, MD;  Location: [place] [facility];  Service: Cardiovascular;  Laterality: N/A;  . CATARACT EXTRACTION W/PHACO Right 07/04/2014   Procedure: CATARACT EXTRACTION PHACO AND INTRAOCULAR LENS PLACEMENT (IOC);  Surgeon: Elta Guadeloupe T. Gershon Crane, MD;  [facility];  Service: Ophthalmology;  Laterality: Right;  CDE:13.13  . COLONOSCOPY    . DILATION AND CURETTAGE OF UTERUS     x2  . Epi Retinal Membrane Peel Left   . ERCP    . ESOPHAGEAL BANDING N/A 04/01/2013   Procedure: ESOPHAGEAL BANDING;  Surgeon: Rogene Houston, MD;  [facility];  Service: Endoscopy;  Laterality: N/A;  . ESOPHAGEAL BANDING N/A 05/24/2013   Procedure: ESOPHAGEAL BANDING;  Surgeon: Rogene Houston, MD;  [facility];  Service: Endoscopy;  Laterality: N/A;  . ESOPHAGEAL BANDING N/A 06/21/2014   Procedure: ESOPHAGEAL BANDING;  Surgeon: Rogene Houston, MD;  [facility];  Service: Endoscopy;  Laterality: N/A;  .  ESOPHAGOGASTRODUODENOSCOPY N/A 04/01/2013   Procedure: ESOPHAGOGASTRODUODENOSCOPY (EGD);  Surgeon: Rogene Houston, MD;  [facility];  Service: Endoscopy;  Laterality: N/A;  230-rescheduled to 8:30am Ann notified pt  . ESOPHAGOGASTRODUODENOSCOPY N/A 05/24/2013   Procedure: ESOPHAGOGASTRODUODENOSCOPY (EGD);  Surgeon: Rogene Houston, MD;  [facility];  Service: Endoscopy;  Laterality: N/A;  730  . ESOPHAGOGASTRODUODENOSCOPY N/A 06/21/2014   Procedure: ESOPHAGOGASTRODUODENOSCOPY (EGD);  Surgeon: Rogene Houston, MD;  [facility];  Service: Endoscopy;  Laterality: N/A;  930-rescheduled 10/14 @ 1200 Ann to notify pt  . EYE SURGERY  08   cataract surgery of the left eye  . FEMUR IM NAIL Left 03/02/2017   Procedure: INTRAMEDULLARY (IM) NAIL  FEMORAL;  Surgeon: Rod Can, MD;  Location: [facility];  Service: Orthopedics;  Laterality: Left;  . HARDWARE REMOVAL Right 01/17/2013   Procedure: REMOVAL OF HARDWARE AND EXCISION ULNAR STYLOID RIGHT WRIST;  Surgeon: Tennis Must, MD;  Location: [place];  Service: Orthopedics;  Laterality: Right;  . MYRINGOTOMY  2012   both ears  . ORIF FEMUR FRACTURE Left 04/23/2017   Procedure: OPEN REDUCTION INTERNAL FIXATION (ORIF) DISTAL FEMUR FRACTURE (FRACTURE AROUND FEMORAL NAIL);  Surgeon: Altamese [place], MD;  Location: [facility];  Service: Orthopedics;  Laterality: Left;  . TONSILLECTOMY    . VAGINAL HYSTERECTOMY  1972  . WRIST SURGERY     rt wrist hardwear removal    There were no vitals filed for this visit.  Subjective Assessment - 11/08/18 1802    Subjective  Patient reports she is not feeling very good today. She reports her Lt leg is reallyhurting her and she feels like it was crooked/turned inward when she woke up today. She reports her Rt hand is really hurting too.     Pertinent History  Stroke on 07/06/18 with administration of TPA per chart review; History of previous TIAs/strokes and history of seizures, DMII, CAD, Carotid artery stenosis    Limitations  Standing;Walking    Diagnostic tests  MRI 07/07/18: "punctate new white matter infarct in the anterior right occipital" "No acute intracranial abnormality" See imaging for further details    Currently in Pain?  Yes    Pain Score  5     Pain Location  Leg    Pain Orientation  Left    Pain Descriptors / Indicators  Aching    Pain Type  Acute pain    Pain Radiating Towards  hip and knee both ache    Pain Onset  Today    Pain Frequency  Intermittent    Aggravating Factors   walking, standing    Pain Relieving Factors  rest    Effect of Pain on Daily Activities  moderate effect  on mobility        [facility] Adult PT Treatment/Exercise - 11/08/18 0001      Knee/Hip Exercises: Seated   Sit to Sand  1 set;10  reps;without UE support   from mat table     Knee/Hip Exercises: Supine   Bridges  2 sets;10 reps    Bridges Limitations  3 second holds    Straight Leg Raise with External Rotation  Both;2 sets;10 reps;Limitations    Straight Leg Raise with External Rotation Limitations  3 sec holds      Knee/Hip Exercises: Sidelying   Clams  2x 10 reps bil LE, 3 sec holds      Knee/Hip Exercises: Prone   Hip Extension  Both;2 sets;10 reps  PT Education - 11/08/18 1806    Education Details  Educated on exercises throughout and on muscle soreness related to strengthening.    Person(s) Educated  Patient    Methods  Explanation;Verbal cues    Comprehension  Verbalized understanding;Returned demonstration       PT Short Term Goals - 11/05/18 1255      PT SHORT TERM GOAL #1   Title  Patient will demonstrate understanding and report regular compliance with HEP to improve strength, balance and functional mobility.     Time  3    Period  Weeks    Status  Achieved      PT SHORT TERM GOAL #2   Title  Patient will report ability to ambulate for at least 20 minutes in order to go to the grocery store and pick out food without frequent rest breaks.    Time  3    Period  Weeks    Status  Achieved        PT Long Term Goals - 11/05/18 1502      PT LONG TERM GOAL #1   Title  Patient will demonstrate improvement of at least 1/2 MMT strength grade in all deficient hip and knee muscles in order to improve stability and balance and decrease patient's risk of falls.     Baseline  10/27/18: Patient achieved in some, but not all muscle groups.     Time  6    Period  Weeks    Status  On-going      PT LONG TERM GOAL #2   Title  Patient will demonstrate improvement of at least 10 seconds on the TUG indicating improved functional mobility and balance.     Baseline  10/27/18: Patient improved time on the TUG from 90 seconds to 73 seconds at this re-assessment.     Time  6    Period  Weeks    Status   Achieved      PT LONG TERM GOAL #3   Title  Patient will demonstrate improvement of at least 7 points on the [facility] Balance Scale indicating improved balance, safety, and decreased risk of falls.     Baseline  10/27/18: Patient improved by a couple points, but not by 7 points.     Time  6    Period  Weeks    Status  On-going        Plan - 11/08/18 1806    Clinical Impression Statement  Patient late to therapy session limiting exercise this date. Patient complained of LE pain in Lt leg this date in standing and exercises were limited to reduced gravity environment to reduce pressure on Lt hip/knee. No boney abnormality was revealed with palpation, patient did experience tenderness with palpation to greater trochanter as well as along her glut med and the ITB along pes anserine of the Lt knee. She was able to perform full ROM for exercises and ambulated with no atalgia usin RW today. Patient will conitnue to benefit from skilled PT Interventions to address impairments and progress mobility to improve QOL.    Rehab Potential  Good    Clinical Impairments Affecting Rehab Potential  Positive: Motivated; Negative: Length of time since patient's stroke    PT Frequency  3x / week    PT Duration  6 weeks    PT Treatment/Interventions  ADLs/Self Care Home Management;Aquatic Therapy;DME Instruction;Gait training;Stair training;Functional mobility training;Therapeutic activities;Therapeutic exercise;Balance training;Neuromuscular re-education;Patient/family education;Orthotic Fit/Training;Wheelchair mobility training;Manual techniques;Passive range of motion;Energy conservation;Taping  PT Next Visit Plan  Continue with focus on lower extremity strengthening and balance. Follow up on Lt LE pain.     PT Home Exercise Plan  10/08/18: Constance Haw, seated marching, SLR, LAQ    Consulted and Agree with Plan of Care  Patient       Patient will benefit from skilled therapeutic intervention in order to improve the  following deficits and impairments:  Abnormal gait, Decreased balance, Decreased endurance, Decreased mobility, Difficulty walking, Improper body mechanics, Decreased activity tolerance, Decreased strength, Postural dysfunction  Visit Diagnosis: Other symptoms and signs involving the musculoskeletal system  Unsteadiness on feet  Muscle weakness (generalized)  Other abnormalities of gait and mobility     Problem List Patient Active Problem List   Diagnosis Date Noted  . Tobacco use disorder 07/09/2018  . Small vessel disease ([place]) 07/09/2018  . Benign essential HTN   . Seizures ([place])   . Tachypnea   . Dysphagia, post-stroke   . Dysarthria 05/19/2018  . Syncope and collapse   . Syncope 08/24/2017  . Closed left femoral fracture ([place]) 04/21/2017  . Seizure disorder ([place]) 04/21/2017  . Closed pertrochanteric fracture of femur, left, initial encounter ([place]) 03/02/2017  . Seizure ([place]) 02/28/2017  . Hip fracture ([place]) 02/28/2017  . Chest pain 12/07/2016  . Atypical chest pain 12/07/2016  . Tachycardia   . NSTEMI (non-ST elevated myocardial infarction) ([place]) 11/02/2016  . Multifocal atrial tachycardia ([place]) 09/25/2016  . Elevated troponin 09/24/2016  . Hypokalemia 09/23/2016  . TIA (transient ischemic attack) 05/05/2016  . Intractable nausea and vomiting 10/15/2015  . Acute coronary syndrome ([place]) 06/01/2015  . Bronchitis 06/01/2015  . Thrombocytopenia ([place]) 06/01/2015  . Cerebral infarction ([place]) right occipital due to small vessel disease s/p tPA 03/06/2014  . Lower urinary tract infectious disease   . PSVT (paroxysmal supraventricular tachycardia) ([place]) 12/09/2013  . Esophageal varices ([place]) 03/29/2013  . Hematemesis 03/29/2013  . Hepatic cirrhosis ([place]) 03/29/2013  . Presence of stent in right coronary artery 07/04/2011  . OSA (obstructive sleep apnea) 07/04/2011  . Aortic sclerosis 07/04/2011  . Aortic insufficiency 07/04/2011  . HTN (hypertension) 07/04/2011  .  Carotid stenosis, bilateral 07/04/2011  . Chest pain at rest 07/03/2011  . CAD (coronary artery disease) 07/03/2011  . Hypercholesteremia 07/03/2011  . GERD (gastroesophageal reflux disease) 07/03/2011    Kipp Brood, PT, DPT, [facility] Physical Therapist with [facility]  11/08/2018 6:10 PM    [facility] [address] [place], [place], [address] Phone: [phone]   Fax:  [phone]  Name: [name] MRN: [id number] Date of Birth: [birth year]

## 2018-11-10 ENCOUNTER — Ambulatory Visit (HOSPITAL_COMMUNITY): Payer: Medicare Other | Admitting: Occupational Therapy

## 2018-11-10 ENCOUNTER — Ambulatory Visit (HOSPITAL_COMMUNITY): Payer: Medicare Other | Admitting: Physical Therapy

## 2018-11-10 ENCOUNTER — Encounter (HOSPITAL_COMMUNITY): Payer: Self-pay | Admitting: Physical Therapy

## 2018-11-10 DIAGNOSIS — R29898 Other symptoms and signs involving the musculoskeletal system: Secondary | ICD-10-CM

## 2018-11-10 DIAGNOSIS — M25531 Pain in right wrist: Secondary | ICD-10-CM

## 2018-11-10 DIAGNOSIS — R2689 Other abnormalities of gait and mobility: Secondary | ICD-10-CM | POA: Diagnosis not present

## 2018-11-10 DIAGNOSIS — R29818 Other symptoms and signs involving the nervous system: Secondary | ICD-10-CM

## 2018-11-10 DIAGNOSIS — R2681 Unsteadiness on feet: Secondary | ICD-10-CM

## 2018-11-10 DIAGNOSIS — M6281 Muscle weakness (generalized): Secondary | ICD-10-CM | POA: Diagnosis not present

## 2018-11-10 DIAGNOSIS — M25631 Stiffness of right wrist, not elsewhere classified: Secondary | ICD-10-CM | POA: Diagnosis not present

## 2018-11-10 NOTE — Therapy (Signed)
[place], [place], [address] Phone: [phone]   Fax:  [phone]  Physical Therapy Treatment  Patient Details  Name: [name] MRN: [id number] Date of Birth: 1940-12-28 Referring Provider (PT): Letta Pate Luanna Salk, MD   Encounter Date: 11/10/2018  PT End of Session - 11/10/18 1521    Visit Number  15    Number of Visits  19    Date for PT Re-Evaluation  11/17/18   Minireassess completed 10/27/18   Authorization Type  [employer] Medicare    Authorization Time Period  10/06/18 - 11/17/18    Authorization - Visit Number  15    Authorization - Number of Visits  19    PT Start Time  2979    PT Stop Time  1515    PT Time Calculation (min)  40 min    Equipment Utilized During Treatment  Gait belt    Activity Tolerance  Patient tolerated treatment well;No increased pain    Behavior During Therapy  WFL for tasks assessed/performed       Past Medical History:  Diagnosis Date  . Anemia   . Aortic insufficiency    Moderate  . Back pain   . Carotid stenosis, bilateral   . Cirrhosis ([place])   . Closed left femoral fracture ([place]) 04/21/2017  . Coronary artery disease    Stent x 2 RCA 1995, cardiac catheterization 09/2016 showing only mild atherosclerosis  . DDD (degenerative disc disease), lumbar   . Essential hypertension   . Falls   . GERD (gastroesophageal reflux disease)   . Hiatal hernia   . History of stroke   . Hypercholesteremia   . IBS (irritable bowel syndrome)   . Non-alcoholic fatty liver disease   . OSA (obstructive sleep apnea)    no CPAP  . Pericardial effusion    a. small by echo 2018.  Marland Kitchen PSVT (paroxysmal supraventricular tachycardia) ([place])   . Recurrent UTI   . Seizures ([place])   . Stroke ([place])   . Type 2 diabetes mellitus ([place])    off medication  . Varices, esophageal ([place])     Past Surgical History:  Procedure Laterality Date  . [place]  . APPENDECTOMY    . BACK SURGERY   1985  . CARDIAC CATHETERIZATION  [phone]   Stent to the proximal RCA after MI   . CARDIAC CATHETERIZATION N/A 09/26/2016   Procedure: Left Heart Cath and Coronary Angiography;  Surgeon: Leonie Man, MD;  Location: [facility];  Service: Cardiovascular;  Laterality: N/A;  . CATARACT EXTRACTION W/PHACO Right 07/04/2014   Procedure: CATARACT EXTRACTION PHACO AND INTRAOCULAR LENS PLACEMENT (IOC);  Surgeon: Elta Guadeloupe T. Gershon Crane, MD;  Location: [facility];  Service: Ophthalmology;  Laterality: Right;  CDE:13.13  . COLONOSCOPY    . DILATION AND CURETTAGE OF UTERUS     x2  . Epi Retinal Membrane Peel Left   . ERCP    . ESOPHAGEAL BANDING N/A 04/01/2013   Procedure: ESOPHAGEAL BANDING;  Surgeon: Rogene Houston, MD;  [facility];  Service: Endoscopy;  Laterality: N/A;  . ESOPHAGEAL BANDING N/A 05/24/2013   Procedure: ESOPHAGEAL BANDING;  Surgeon: Rogene Houston, MD;  [facility];  Service: Endoscopy;  Laterality: N/A;  . ESOPHAGEAL BANDING N/A 06/21/2014   Procedure: ESOPHAGEAL BANDING;  Surgeon: Rogene Houston, MD;  [facility];  Service: Endoscopy;  Laterality: N/A;  . ESOPHAGOGASTRODUODENOSCOPY N/A 04/01/2013  Procedure: ESOPHAGOGASTRODUODENOSCOPY (EGD);  Surgeon: Rogene Houston, MD;  [facility];  Service: Endoscopy;  Laterality: N/A;  230-rescheduled to 8:30am Ann notified pt  . ESOPHAGOGASTRODUODENOSCOPY N/A 05/24/2013   Procedure: ESOPHAGOGASTRODUODENOSCOPY (EGD);  Surgeon: Rogene Houston, MD;  [facility];  Service: Endoscopy;  Laterality: N/A;  730  . ESOPHAGOGASTRODUODENOSCOPY N/A 06/21/2014   Procedure: ESOPHAGOGASTRODUODENOSCOPY (EGD);  Surgeon: Rogene Houston, MD;  [facility];  Service: Endoscopy;  Laterality: N/A;  930-rescheduled 10/14 @ 1200 Ann to notify pt  . EYE SURGERY  08   cataract surgery of the left eye  . FEMUR IM NAIL Left 03/02/2017   Procedure: INTRAMEDULLARY (IM) NAIL FEMORAL;  Surgeon:  Rod Can, MD;  Location: [facility];  Service: Orthopedics;  Laterality: Left;  . HARDWARE REMOVAL Right 01/17/2013   Procedure: REMOVAL OF HARDWARE AND EXCISION ULNAR STYLOID RIGHT WRIST;  Surgeon: Tennis Must, MD;  Location: [facility];  Service: Orthopedics;  Laterality: Right;  . MYRINGOTOMY  2012   both ears  . ORIF FEMUR FRACTURE Left 04/23/2017   Procedure: OPEN REDUCTION INTERNAL FIXATION (ORIF) DISTAL FEMUR FRACTURE (FRACTURE AROUND FEMORAL NAIL);  Surgeon: Altamese Maumelle, MD;  Location: [facility];  Service: Orthopedics;  Laterality: Left;  . TONSILLECTOMY    . VAGINAL HYSTERECTOMY  1972  . WRIST SURGERY     rt wrist hardwear removal    There were no vitals filed for this visit.  Subjective Assessment - 11/10/18 1439    Subjective  Patient reported no hip pain today. She stated she has been able to do her exercises at home.     Pertinent History  Stroke on 07/06/18 with administration of TPA per chart review; History of previous TIAs/strokes and history of seizures, DMII, CAD, Carotid artery stenosis    Limitations  Standing;Walking    Diagnostic tests  MRI 07/07/18: "punctate new white matter infarct in the anterior right occipital" "No acute intracranial abnormality" See imaging for further details    Currently in Pain?  No/denies                       [facility] Adult PT Treatment/Exercise - 11/10/18 0001      Knee/Hip Exercises: Standing   Functional Squat  10 reps    Functional Squat Limitations  Bilateral UE assistance    Other Standing Knee Exercises  Marching 30x alternating legs 2# ankle weights      Knee/Hip Exercises: Seated   Long Arc Quad  Left;10 reps;Weights    Long Arc Quad Weight  6 lbs.   6# ankle weight   Sit to Sand  1 set;10 reps;without UE support      Knee/Hip Exercises: Supine   Bridges  2 sets;10 reps    Bridges Limitations  3 second holds          Balance Exercises - 11/10/18 1524      Balance Exercises:  Standing   Tandem Gait  --   Forward gait through ladder x 3 RT   Retro Gait  --   In // bars x 3 RT   Sidestepping  --   In // bars x 3 RT with RTB around ankles       PT Education - 11/10/18 1520    Education Details  Educated on added HEP.    Person(s) Educated  Patient    Methods  Explanation;Handout    Comprehension  Verbalized understanding  PT Short Term Goals - 11/05/18 1255      PT SHORT TERM GOAL #1   Title  Patient will demonstrate understanding and report regular compliance with HEP to improve strength, balance and functional mobility.     Time  3    Period  Weeks    Status  Achieved      PT SHORT TERM GOAL #2   Title  Patient will report ability to ambulate for at least 20 minutes in order to go to the grocery store and pick out food without frequent rest breaks.    Time  3    Period  Weeks    Status  Achieved        PT Long Term Goals - 11/05/18 1502      PT LONG TERM GOAL #1   Title  Patient will demonstrate improvement of at least 1/2 MMT strength grade in all deficient hip and knee muscles in order to improve stability and balance and decrease patient's risk of falls.     Baseline  10/27/18: Patient achieved in some, but not all muscle groups.     Time  6    Period  Weeks    Status  On-going      PT LONG TERM GOAL #2   Title  Patient will demonstrate improvement of at least 10 seconds on the TUG indicating improved functional mobility and balance.     Baseline  10/27/18: Patient improved time on the TUG from 90 seconds to 73 seconds at this re-assessment.     Time  6    Period  Weeks    Status  Achieved      PT LONG TERM GOAL #3   Title  Patient will demonstrate improvement of at least 7 points on the [facility] Balance Scale indicating improved balance, safety, and decreased risk of falls.     Baseline  10/27/18: Patient improved by a couple points, but not by 7 points.     Time  6    Period  Weeks    Status  On-going            Plan -  11/10/18 1529    Clinical Impression Statement  Continued with established plan of care this session. Incorporated retro walking this session. Also resumed forward walking with ladder to improve patient's step length. Added functional mini-squats to patient's HEP. Educated patient on safe performance of this exercise with bilateral upper extremity support on a stable surface. Patient would benefit from continued skilled physical therapy in order to continue progressing towards functional goals.     Rehab Potential  Good    Clinical Impairments Affecting Rehab Potential  Positive: Motivated; Negative: Length of time since patient's stroke    PT Frequency  3x / week    PT Duration  6 weeks    PT Treatment/Interventions  ADLs/Self Care Home Management;Aquatic Therapy;DME Instruction;Gait training;Stair training;Functional mobility training;Therapeutic activities;Therapeutic exercise;Balance training;Neuromuscular re-education;Patient/family education;Orthotic Fit/Training;Wheelchair mobility training;Manual techniques;Passive range of motion;Energy conservation;Taping    PT Next Visit Plan  Continue with focus on lower extremity strengthening and balance. Continue ambulation and balance focus and functional LE strengthening.     PT Home Exercise Plan  10/08/18: Bridges, seated marching, SLR, LAQ    Consulted and Agree with Plan of Care  Patient       Patient will benefit from skilled therapeutic intervention in order to improve the following deficits and impairments:  Abnormal gait, Decreased balance, Decreased endurance, Decreased mobility, Difficulty  walking, Improper body mechanics, Decreased activity tolerance, Decreased strength, Postural dysfunction  Visit Diagnosis: Other symptoms and signs involving the nervous system  Muscle weakness (generalized)  Unsteadiness on feet  Other abnormalities of gait and mobility     Problem List Patient Active Problem List   Diagnosis Date Noted  .  Tobacco use disorder 07/09/2018  . Small vessel disease ([place]) 07/09/2018  . Benign essential HTN   . Seizures ([place])   . Tachypnea   . Dysphagia, post-stroke   . Dysarthria 05/19/2018  . Syncope and collapse   . Syncope 08/24/2017  . Closed left femoral fracture ([place]) 04/21/2017  . Seizure disorder ([place]) 04/21/2017  . Closed pertrochanteric fracture of femur, left, initial encounter ([place]) 03/02/2017  . Seizure ([place]) 02/28/2017  . Hip fracture ([place]) 02/28/2017  . Chest pain 12/07/2016  . Atypical chest pain 12/07/2016  . Tachycardia   . NSTEMI (non-ST elevated myocardial infarction) ([place]) 11/02/2016  . Multifocal atrial tachycardia ([place]) 09/25/2016  . Elevated troponin 09/24/2016  . Hypokalemia 09/23/2016  . TIA (transient ischemic attack) 05/05/2016  . Intractable nausea and vomiting 10/15/2015  . Acute coronary syndrome ([place]) 06/01/2015  . Bronchitis 06/01/2015  . Thrombocytopenia ([place]) 06/01/2015  . Cerebral infarction ([place]) right occipital due to small vessel disease s/p tPA 03/06/2014  . Lower urinary tract infectious disease   . PSVT (paroxysmal supraventricular tachycardia) ([place]) 12/09/2013  . Esophageal varices ([place]) 03/29/2013  . Hematemesis 03/29/2013  . Hepatic cirrhosis ([place]) 03/29/2013  . Presence of stent in right coronary artery 07/04/2011  . OSA (obstructive sleep apnea) 07/04/2011  . Aortic sclerosis 07/04/2011  . Aortic insufficiency 07/04/2011  . HTN (hypertension) 07/04/2011  . Carotid stenosis, bilateral 07/04/2011  . Chest pain at rest 07/03/2011  . CAD (coronary artery disease) 07/03/2011  . Hypercholesteremia 07/03/2011  . GERD (gastroesophageal reflux disease) 07/03/2011   Clarene Critchley PT, DPT 3:32 PM, 11/10/18 [place], [place], [address] Phone: [phone]   Fax:  [phone]  Name: [name] MRN: [id number] Date of Birth: [birth year]

## 2018-11-10 NOTE — Patient Instructions (Signed)
(  Home) Squat: (Assist)    Using supports, arms close to body, squat by dropping hips back as if sitting in a chair. Use stable surface such as countertop for support.  Repeat _10___ times per set. Do ___1_ sets per session. Do __7__ sessions per week.  Copyright  VHI. All rights reserved.

## 2018-11-10 NOTE — Therapy (Signed)
[place], [place], [address] Phone: [phone]   Fax:  [phone]  Occupational Therapy Treatment  Patient Details  Name: [name] MRN: [id number] Date of Birth: [birth year] Referring Provider (OT): Dr. Alysia Penna   Encounter Date: 11/10/2018  OT End of Session - 11/10/18 1605    Visit Number  4    Number of Visits  8    Date for OT Re-Evaluation  11/26/18    Authorization Type  UHC Medicare    Authorization Time Period  visits based on medical necessity; $30 copay    OT Start Time  1517    OT Stop Time  1558    OT Time Calculation (min)  41 min    Activity Tolerance  Patient tolerated treatment well    Behavior During Therapy  [facility] for tasks assessed/performed       Past Medical History:  Diagnosis Date  . Anemia   . Aortic insufficiency    Moderate  . Back pain   . Carotid stenosis, bilateral   . Cirrhosis ([place])   . Closed left femoral fracture ([place]) 04/21/2017  . Coronary artery disease    Stent x 2 RCA 1995, cardiac catheterization 09/2016 showing only mild atherosclerosis  . DDD (degenerative disc disease), lumbar   . Essential hypertension   . Falls   . GERD (gastroesophageal reflux disease)   . Hiatal hernia   . History of stroke   . Hypercholesteremia   . IBS (irritable bowel syndrome)   . Non-alcoholic fatty liver disease   . OSA (obstructive sleep apnea)    no CPAP  . Pericardial effusion    a. small by echo 2018.  Marland Kitchen PSVT (paroxysmal supraventricular tachycardia) ([place])   . Recurrent UTI   . Seizures ([place])   . Stroke ([place])   . Type 2 diabetes mellitus ([place])    off medication  . Varices, esophageal ([place])     Past Surgical History:  Procedure Laterality Date  . [place]  . APPENDECTOMY    . BACK SURGERY  1985  . CARDIAC CATHETERIZATION  [phone]   Stent to the proximal RCA after MI   . CARDIAC CATHETERIZATION N/A 09/26/2016   Procedure: Left  Heart Cath and Coronary Angiography;  Surgeon: Leonie Man, MD;  Location: [facility];  Service: Cardiovascular;  Laterality: N/A;  . CATARACT EXTRACTION W/PHACO Right 07/04/2014   Procedure: CATARACT EXTRACTION PHACO AND INTRAOCULAR LENS PLACEMENT (IOC);  Surgeon: Elta Guadeloupe T. Gershon Crane, MD;  [facility];  Service: Ophthalmology;  Laterality: Right;  CDE:13.13  . COLONOSCOPY    . DILATION AND CURETTAGE OF UTERUS     x2  . Epi Retinal Membrane Peel Left   . ERCP    . ESOPHAGEAL BANDING N/A 04/01/2013   Procedure: ESOPHAGEAL BANDING;  Surgeon: Rogene Houston, MD;  [facility];  Service: Endoscopy;  Laterality: N/A;  . ESOPHAGEAL BANDING N/A 05/24/2013   Procedure: ESOPHAGEAL BANDING;  Surgeon: Rogene Houston, MD;  [facility];  Service: Endoscopy;  Laterality: N/A;  . ESOPHAGEAL BANDING N/A 06/21/2014   Procedure: ESOPHAGEAL BANDING;  Surgeon: Rogene Houston, MD;  [facility];  Service: Endoscopy;  Laterality: N/A;  . ESOPHAGOGASTRODUODENOSCOPY N/A 04/01/2013   Procedure: ESOPHAGOGASTRODUODENOSCOPY (EGD);  Surgeon: Rogene Houston, MD;  [facility];  Service: Endoscopy;  Laterality: N/A;  230-rescheduled to 8:30am Ann notified pt  . ESOPHAGOGASTRODUODENOSCOPY  N/A 05/24/2013   Procedure: ESOPHAGOGASTRODUODENOSCOPY (EGD);  Surgeon: Rogene Houston, MD;  [facility];  Service: Endoscopy;  Laterality: N/A;  730  . ESOPHAGOGASTRODUODENOSCOPY N/A 06/21/2014   Procedure: ESOPHAGOGASTRODUODENOSCOPY (EGD);  Surgeon: Rogene Houston, MD;  [facility];  Service: Endoscopy;  Laterality: N/A;  930-rescheduled 10/14 @ 1200 Ann to notify pt  . EYE SURGERY  08   cataract surgery of the left eye  . FEMUR IM NAIL Left 03/02/2017   Procedure: INTRAMEDULLARY (IM) NAIL FEMORAL;  Surgeon: Rod Can, MD;  Location: [facility];  Service: Orthopedics;  Laterality: Left;  . HARDWARE REMOVAL Right 01/17/2013   Procedure: REMOVAL OF HARDWARE  AND EXCISION ULNAR STYLOID RIGHT WRIST;  Surgeon: Tennis Must, MD;  Location: [facility];  Service: Orthopedics;  Laterality: Right;  . MYRINGOTOMY  2012   both ears  . ORIF FEMUR FRACTURE Left 04/23/2017   Procedure: OPEN REDUCTION INTERNAL FIXATION (ORIF) DISTAL FEMUR FRACTURE (FRACTURE AROUND FEMORAL NAIL);  Surgeon: Altamese Marathon, MD;  Location: [facility];  Service: Orthopedics;  Laterality: Left;  . TONSILLECTOMY    . VAGINAL HYSTERECTOMY  1972  . WRIST SURGERY     rt wrist hardwear removal    There were no vitals filed for this visit.  Subjective Assessment - 11/10/18 1517    Subjective   S: My wrist was really hurting yesterday, it's a little better today.     Currently in Pain?  Yes    Pain Score  4     Pain Location  Wrist    Pain Orientation  Left    Pain Descriptors / Indicators  Throbbing    Pain Type  Acute pain    Pain Radiating Towards  n/a    Pain Onset  In the past 7 days    Pain Frequency  Intermittent    Aggravating Factors   use    Pain Relieving Factors  rest, pain medication    Effect of Pain on Daily Activities  min on ADLs    Multiple Pain Sites  No                   OT Treatments/Exercises (OP) - 11/10/18 1519      Exercises   Exercises  Wrist;Hand;Theraputty      Weighted Stretch Over Towel Roll   Supination - Weighted Stretch  1 pound;60 seconds   2 reps   Wrist Flexion - Weighted Stretch  1 pound;60 seconds   2 reps   Wrist Extension - Weighted Stretch  1 pound;60 seconds   2 reps     Wrist Exercises   Wrist Flexion  PROM;10 reps;AROM;15 reps    Wrist Extension  PROM;10 reps;AROM;15 reps    Wrist Radial Deviation  PROM;10 reps;AROM;15 reps    Wrist Ulnar Deviation  PROM;10 reps;AROM;15 reps    Other wrist exercises  supination, pronation, A/ROM, 12X each    Other wrist exercises  pt used pvc pipe to cut circles into yellow putty, working on wrist flexion and extension      Additional Wrist Exercises   Sponges   Pt used green clothespin and 3 point pinch to stack 4 piles of 5 sponges. Pt then switched to lateral pinch to place 20 sponges into the bucket.     Hand Gripper with Large Beads  all beads gripper at 22#    Hand Gripper with Medium Beads  all beads gripper at 22#  Theraputty   Theraputty - Flatten  yellow working on wrist extension               OT Short Term Goals - 11/03/18 1547      OT SHORT TERM GOAL #1   Title  Pt will be educated on HEP to improve right wrist mobility required for ADL completion.     Time  4    Period  Weeks    Status  On-going    Target Date  11/26/18      OT SHORT TERM GOAL #2   Title  Pt will decrease right wrist pain to 3/10 or less to improve ability to support herself comfortably when using her walker.     Time  4    Period  Weeks    Status  On-going      OT SHORT TERM GOAL #3   Title  Pt will increase right wrist A/ROM to [facility] to improve ability to curl her hair.     Time  4    Period  Weeks    Status  On-going      OT SHORT TERM GOAL #4   Title  Pt will increase right wrist strength to improve ability to maintain hold on weighted items.     Time  4    Period  Weeks    Status  On-going      OT SHORT TERM GOAL #5   Title  Pt will improve right pinch strength by 2# to increase ability to hold onto tools and utensils during ADLs.     Time  4    Period  Weeks    Status  On-going               Plan - 11/10/18 1544    Clinical Impression Statement  A: Pt reporting increased wrist pain after last session, however has improved for today. Continued with A/ROM and weighted stretches, increasing stretch hold to 1' each. Pt with improvement in gripper activity today, resumed theraputty activity with focus on wrist flexion/extension. Verbal cuing for form and technique. Educated on expectation of soreness and discomfort.     Body Structure / Function / Physical Skills  ROM;Flexibility;Endurance;Fascial restriction;Pain;Strength     Plan  P: Attempt wrist strengthening with 1# weight, increase hand gripper resistance       Patient will benefit from skilled therapeutic intervention in order to improve the following deficits and impairments:  Body Structure / Function / Physical Skills  Visit Diagnosis: Other symptoms and signs involving the musculoskeletal system  Stiffness of right wrist, not elsewhere classified  Pain in right wrist    Problem List Patient Active Problem List   Diagnosis Date Noted  . Tobacco use disorder 07/09/2018  . Small vessel disease ([place]) 07/09/2018  . Benign essential HTN   . Seizures ([place])   . Tachypnea   . Dysphagia, post-stroke   . Dysarthria 05/19/2018  . Syncope and collapse   . Syncope 08/24/2017  . Closed left femoral fracture ([place]) 04/21/2017  . Seizure disorder ([place]) 04/21/2017  . Closed pertrochanteric fracture of femur, left, initial encounter ([place]) 03/02/2017  . Seizure ([place]) 02/28/2017  . Hip fracture ([place]) 02/28/2017  . Chest pain 12/07/2016  . Atypical chest pain 12/07/2016  . Tachycardia   . NSTEMI (non-ST elevated myocardial infarction) ([place]) 11/02/2016  . Multifocal atrial tachycardia ([place]) 09/25/2016  . Elevated troponin 09/24/2016  . Hypokalemia 09/23/2016  . TIA (transient ischemic attack) 05/05/2016  .  Intractable nausea and vomiting 10/15/2015  . Acute coronary syndrome ([place]) 06/01/2015  . Bronchitis 06/01/2015  . Thrombocytopenia ([place]) 06/01/2015  . Cerebral infarction ([place]) right occipital due to small vessel disease s/p tPA 03/06/2014  . Lower urinary tract infectious disease   . PSVT (paroxysmal supraventricular tachycardia) ([place]) 12/09/2013  . Esophageal varices ([place]) 03/29/2013  . Hematemesis 03/29/2013  . Hepatic cirrhosis ([place]) 03/29/2013  . Presence of stent in right coronary artery 07/04/2011  . OSA (obstructive sleep apnea) 07/04/2011  . Aortic sclerosis 07/04/2011  . Aortic insufficiency 07/04/2011  . HTN (hypertension) 07/04/2011   . Carotid stenosis, bilateral 07/04/2011  . Chest pain at rest 07/03/2011  . CAD (coronary artery disease) 07/03/2011  . Hypercholesteremia 07/03/2011  . GERD (gastroesophageal reflux disease) 07/03/2011   Guadelupe Sabin, OTR/L  [phone] 11/10/2018, 4:05 PM  [facility] [address] [place], [place], [address] Phone: [phone]   Fax:  [phone]  Name: [name] MRN: [id number] Date of Birth: 1941-04-10

## 2018-11-12 ENCOUNTER — Telehealth (HOSPITAL_COMMUNITY): Payer: Self-pay | Admitting: Internal Medicine

## 2018-11-12 ENCOUNTER — Ambulatory Visit (HOSPITAL_COMMUNITY): Payer: Medicare Other | Admitting: Physical Therapy

## 2018-11-12 NOTE — Telephone Encounter (Signed)
11/12/18  gentleman caller stated that she was feeling under the weather today

## 2018-11-13 IMAGING — CT CT HEAD W/O CM
3 series · 16 of 47 positions shown, 19 images · non-contrast
Comparison: 08/24/2017

CLINICAL DATA: Headaches

EXAM:
CT HEAD WITHOUT CONTRAST
TECHNIQUE: Contiguous axial images were obtained from the base of the skull
through the vertex without intravenous contrast.

[Series 2: head trauma wo · axial · 0.41mm/px · z∈[+22,+147]mm · 10 of 30 slices shown, 13 images]
[im 3/30  brain]
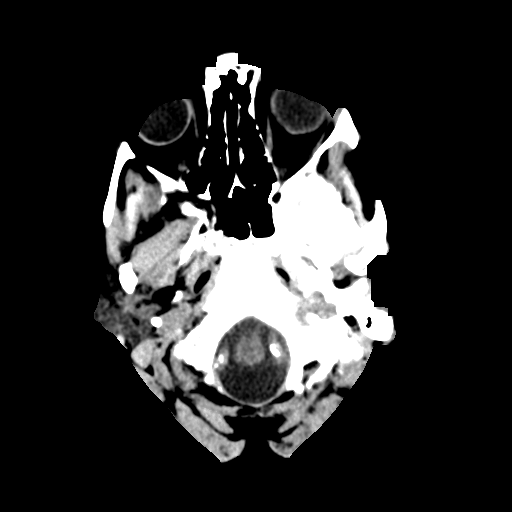
[im 3/30  bone]
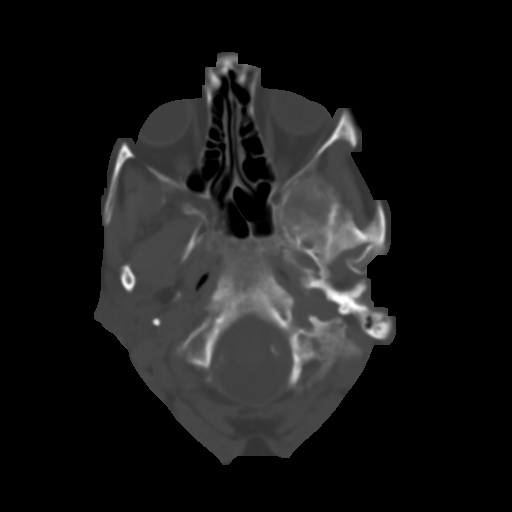
[im 6/30  brain]
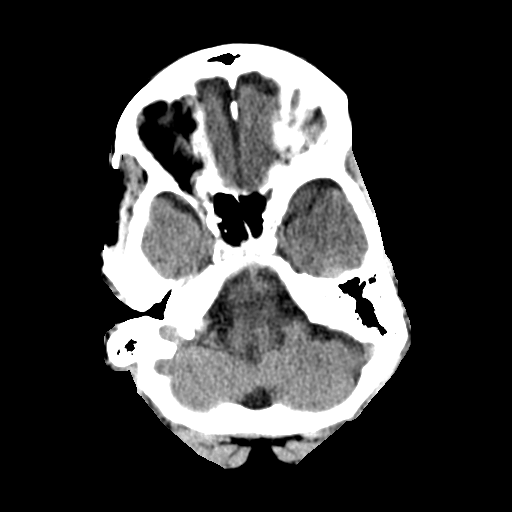
[im 9/30  brain]
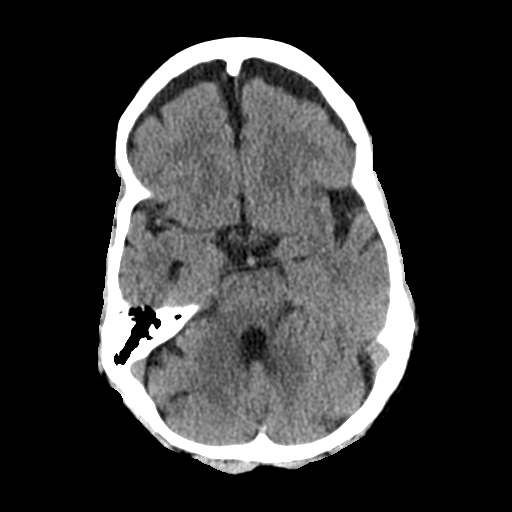
[im 11/30  brain]
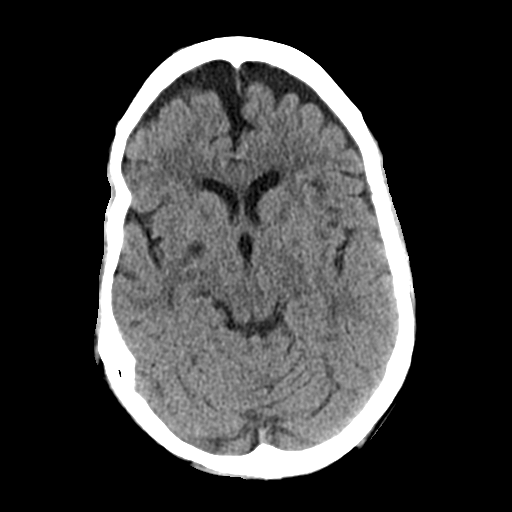
[im 14/30  brain]
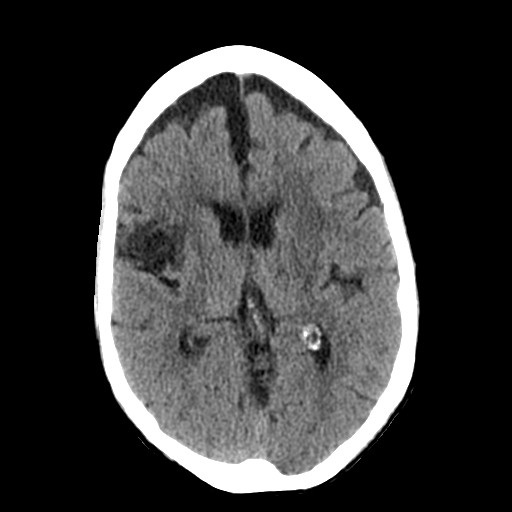
[im 14/30  bone]
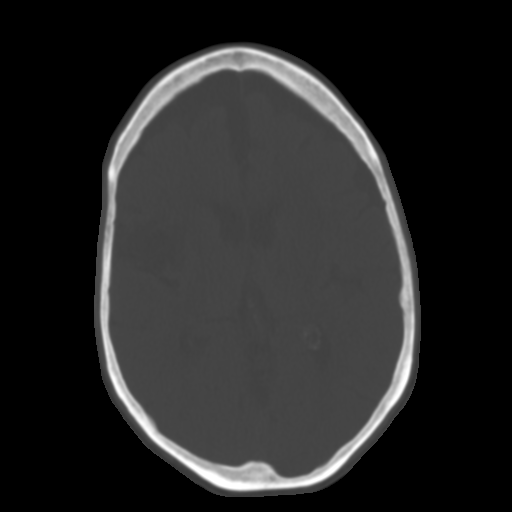
[im 17/30  brain]
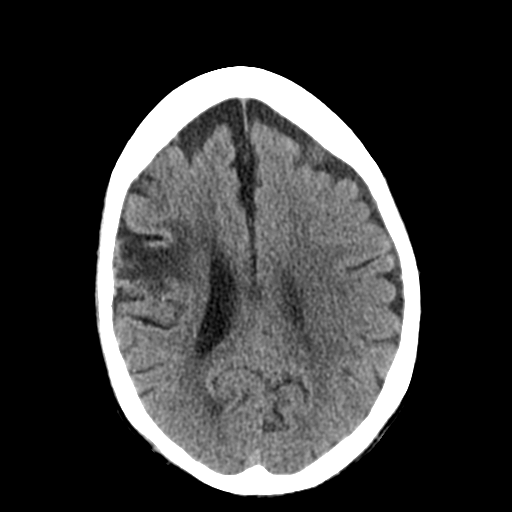
[im 20/30  brain]
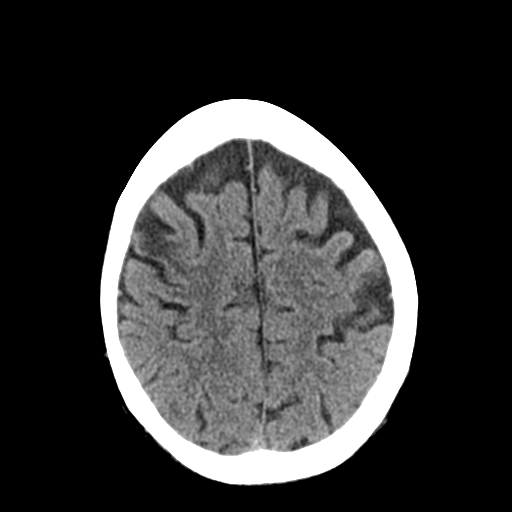
[im 23/30  brain]
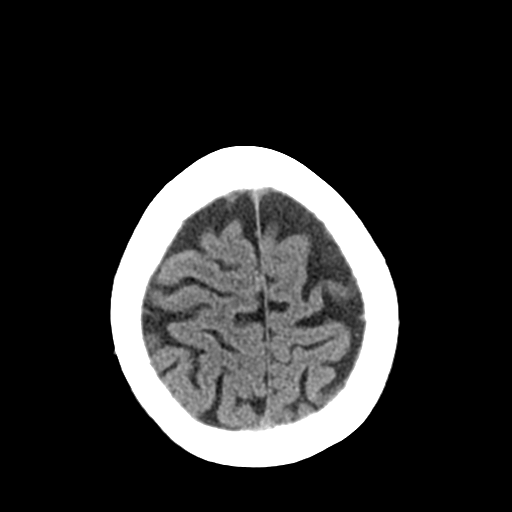
[im 25/30  brain]
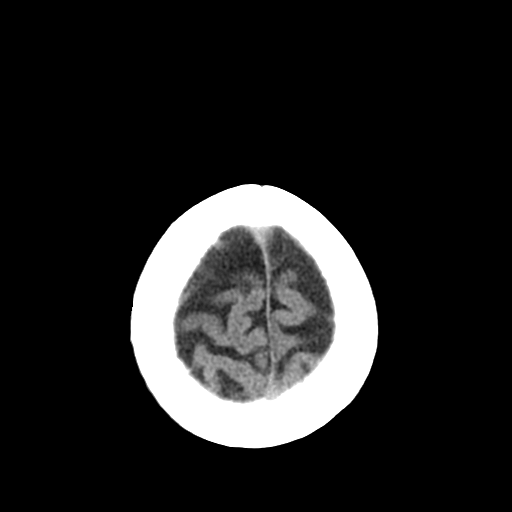
[im 25/30  bone]
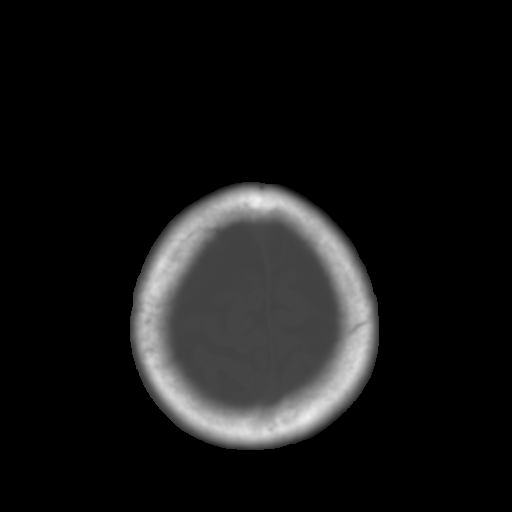
[im 28/30  brain]
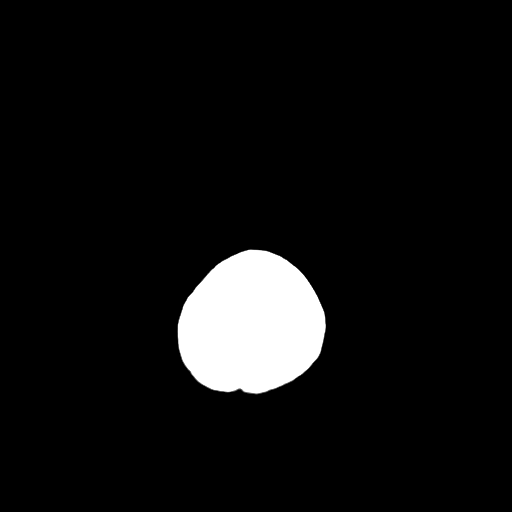

[Series 4: coronal soft tissue · coronal · 0.29mm/px · 3 of 68 slices shown]
[im 23/68  brain]
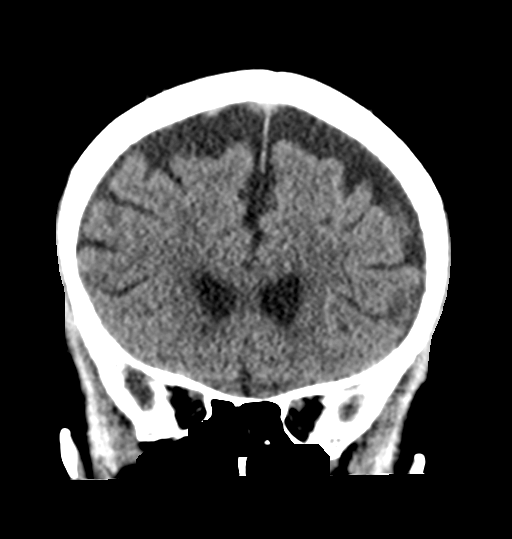
[im 30/68  brain]
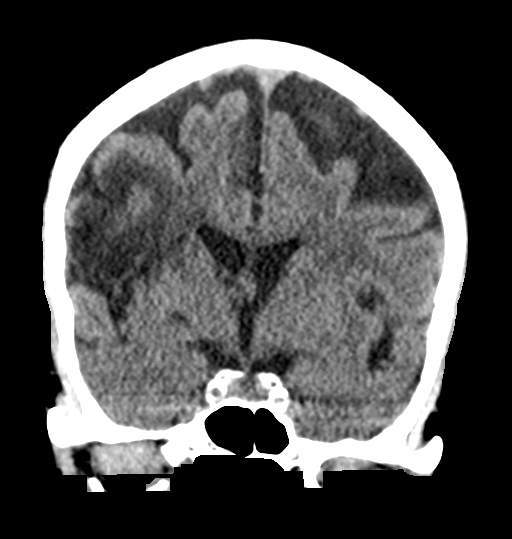
[im 38/68  brain]
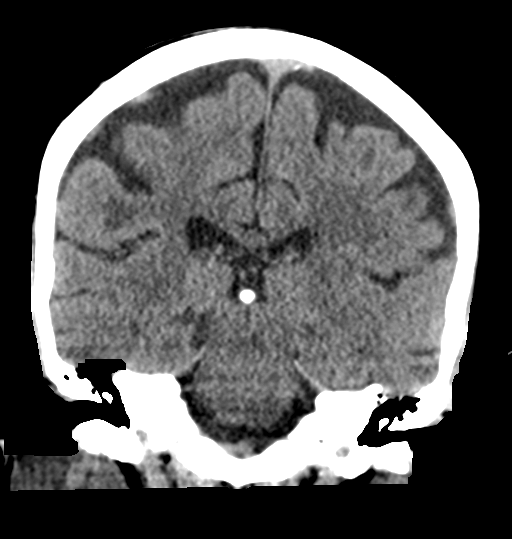

[Series 5: sagittal soft tissue · sagittal · 0.33mm/px · 3 of 53 slices shown]
[im 18/53  brain]
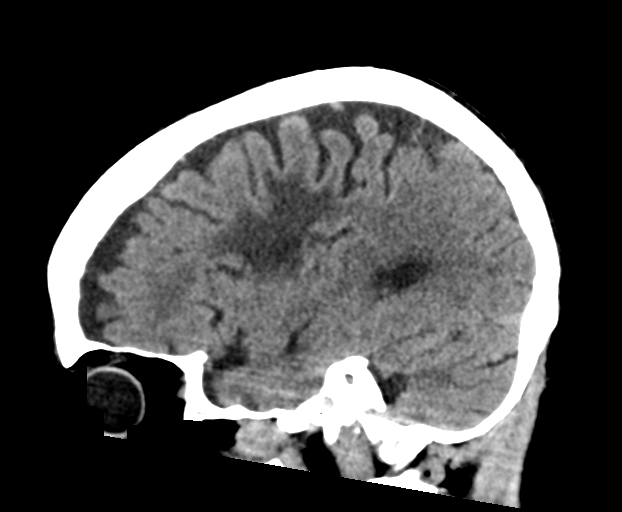
[im 27/53  brain]
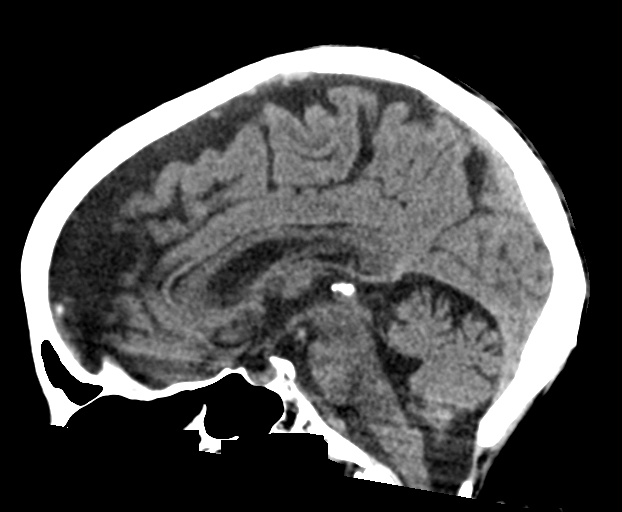
[im 35/53  brain]
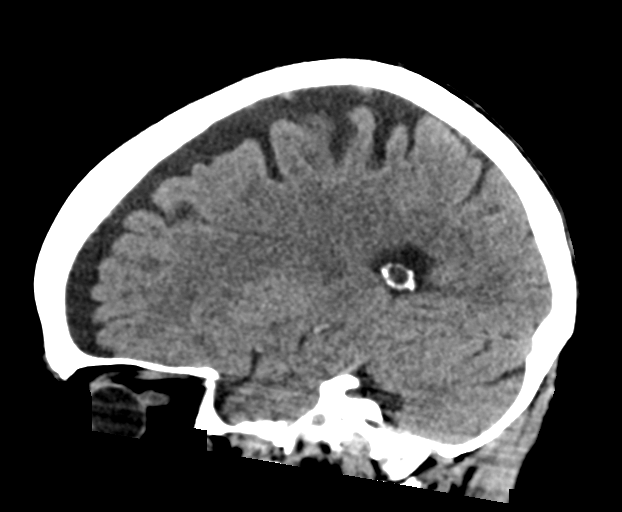

[16 of 47 positions shown; findings below may reference images not displayed]

FINDINGS: Brain: Old right frontal infarct with encephalomalacia, stable.
There is atrophy and chronic small vessel disease changes. No acute
intracranial abnormality. Specifically, no hemorrhage,
hydrocephalus, mass lesion, acute infarction, or significant
intracranial injury. Old right basal ganglia lacunar infarcts,
stable.

Vascular: No hyperdense vessel or unexpected calcification.

Skull: No acute calvarial abnormality.

Sinuses/Orbits: Visualized paranasal sinuses and mastoids clear.
Orbital soft tissues unremarkable.

Other: None
IMPRESSION: Old right frontal and basal ganglia infarcts. Atrophy, chronic small
vessel disease.

No acute intracranial abnormality.

## 2018-11-15 ENCOUNTER — Encounter (HOSPITAL_COMMUNITY): Payer: Self-pay

## 2018-11-15 ENCOUNTER — Other Ambulatory Visit: Payer: Self-pay

## 2018-11-15 ENCOUNTER — Encounter (HOSPITAL_COMMUNITY): Payer: Self-pay | Admitting: Specialist

## 2018-11-15 ENCOUNTER — Ambulatory Visit (HOSPITAL_COMMUNITY): Payer: Medicare Other | Admitting: Specialist

## 2018-11-15 ENCOUNTER — Ambulatory Visit (HOSPITAL_COMMUNITY): Payer: Medicare Other

## 2018-11-15 DIAGNOSIS — M6281 Muscle weakness (generalized): Secondary | ICD-10-CM | POA: Diagnosis not present

## 2018-11-15 DIAGNOSIS — R2689 Other abnormalities of gait and mobility: Secondary | ICD-10-CM | POA: Diagnosis not present

## 2018-11-15 DIAGNOSIS — R29898 Other symptoms and signs involving the musculoskeletal system: Secondary | ICD-10-CM | POA: Diagnosis not present

## 2018-11-15 DIAGNOSIS — M25531 Pain in right wrist: Secondary | ICD-10-CM | POA: Diagnosis not present

## 2018-11-15 DIAGNOSIS — R2681 Unsteadiness on feet: Secondary | ICD-10-CM | POA: Diagnosis not present

## 2018-11-15 DIAGNOSIS — M25631 Stiffness of right wrist, not elsewhere classified: Secondary | ICD-10-CM | POA: Diagnosis not present

## 2018-11-15 DIAGNOSIS — R29818 Other symptoms and signs involving the nervous system: Secondary | ICD-10-CM | POA: Diagnosis not present

## 2018-11-15 NOTE — Therapy (Signed)
[place], [place], [address] Phone: [phone]   Fax:  [phone]  Occupational Therapy Treatment  Patient Details  Name: [name] MRN: [id number] Date of Birth: 1941/07/29 Referring Provider (OT): Dr. Alysia Penna   Encounter Date: 11/15/2018  OT End of Session - 11/15/18 1633    Visit Number  5    Number of Visits  8    Date for OT Re-Evaluation  11/26/18    Authorization Type  UHC Medicare    Authorization Time Period  visits based on medical necessity; $30 copay    OT Start Time  1525    OT Stop Time  1605    OT Time Calculation (min)  40 min    Activity Tolerance  Patient tolerated treatment well    Behavior During Therapy  [facility] for tasks assessed/performed       Past Medical History:  Diagnosis Date  . Anemia   . Aortic insufficiency    Moderate  . Back pain   . Carotid stenosis, bilateral   . Cirrhosis ([place])   . Closed left femoral fracture ([place]) 04/21/2017  . Coronary artery disease    Stent x 2 RCA 1995, cardiac catheterization 09/2016 showing only mild atherosclerosis  . DDD (degenerative disc disease), lumbar   . Essential hypertension   . Falls   . GERD (gastroesophageal reflux disease)   . Hiatal hernia   . History of stroke   . Hypercholesteremia   . IBS (irritable bowel syndrome)   . Non-alcoholic fatty liver disease   . OSA (obstructive sleep apnea)    no CPAP  . Pericardial effusion    a. small by echo 2018.  Marland Kitchen PSVT (paroxysmal supraventricular tachycardia) ([place])   . Recurrent UTI   . Seizures ([place])   . Stroke ([place])   . Type 2 diabetes mellitus ([place])    off medication  . Varices, esophageal ([place])     Past Surgical History:  Procedure Laterality Date  . [place]  . APPENDECTOMY    . BACK SURGERY  1985  . CARDIAC CATHETERIZATION  [phone]   Stent to the proximal RCA after MI   . CARDIAC CATHETERIZATION N/A 09/26/2016   Procedure: Left  Heart Cath and Coronary Angiography;  Surgeon: Leonie Man, MD;  Location: [place] [facility];  Service: Cardiovascular;  Laterality: N/A;  . CATARACT EXTRACTION W/PHACO Right 07/04/2014   Procedure: CATARACT EXTRACTION PHACO AND INTRAOCULAR LENS PLACEMENT (IOC);  Surgeon: Elta Guadeloupe T. Gershon Crane, MD;  [facility]: [facility];  Service: Ophthalmology;  Laterality: Right;  CDE:13.13  . COLONOSCOPY    . DILATION AND CURETTAGE OF UTERUS     x2  . Epi Retinal Membrane Peel Left   . ERCP    . ESOPHAGEAL BANDING N/A 04/01/2013   Procedure: ESOPHAGEAL BANDING;  Surgeon: Rogene Houston, MD;  [facility];  Service: Endoscopy;  Laterality: N/A;  . ESOPHAGEAL BANDING N/A 05/24/2013   Procedure: ESOPHAGEAL BANDING;  Surgeon: Rogene Houston, MD;  [facility];  Service: Endoscopy;  Laterality: N/A;  . ESOPHAGEAL BANDING N/A 06/21/2014   Procedure: ESOPHAGEAL BANDING;  Surgeon: Rogene Houston, MD;  [facility];  Service: Endoscopy;  Laterality: N/A;  . ESOPHAGOGASTRODUODENOSCOPY N/A 04/01/2013   Procedure: ESOPHAGOGASTRODUODENOSCOPY (EGD);  Surgeon: Rogene Houston, MD;  [facility];  Service: Endoscopy;  Laterality: N/A;  230-rescheduled to 8:30am Ann notified pt  . ESOPHAGOGASTRODUODENOSCOPY  N/A 05/24/2013   Procedure: ESOPHAGOGASTRODUODENOSCOPY (EGD);  Surgeon: Rogene Houston, MD;  [facility];  Service: Endoscopy;  Laterality: N/A;  730  . ESOPHAGOGASTRODUODENOSCOPY N/A 06/21/2014   Procedure: ESOPHAGOGASTRODUODENOSCOPY (EGD);  Surgeon: Rogene Houston, MD;  [facility];  Service: Endoscopy;  Laterality: N/A;  930-rescheduled 10/14 @ 1200 Ann to notify pt  . EYE SURGERY  08   cataract surgery of the left eye  . FEMUR IM NAIL Left 03/02/2017   Procedure: INTRAMEDULLARY (IM) NAIL FEMORAL;  Surgeon: Rod Can, MD;  Location: [place];  Service: Orthopedics;  Laterality: Left;  . HARDWARE REMOVAL Right 01/17/2013   Procedure: REMOVAL OF HARDWARE  AND EXCISION ULNAR STYLOID RIGHT WRIST;  Surgeon: Tennis Must, MD;  Location: [place];  Service: Orthopedics;  Laterality: Right;  . MYRINGOTOMY  2012   both ears  . ORIF FEMUR FRACTURE Left 04/23/2017   Procedure: OPEN REDUCTION INTERNAL FIXATION (ORIF) DISTAL FEMUR FRACTURE (FRACTURE AROUND FEMORAL NAIL);  Surgeon: Altamese [place], MD;  Location: [place];  Service: Orthopedics;  Laterality: Left;  . TONSILLECTOMY    . VAGINAL HYSTERECTOMY  1972  . WRIST SURGERY     rt wrist hardwear removal    There were no vitals filed for this visit.  Subjective Assessment - 11/15/18 1633    Subjective   S:  I want to get this wrist moving a bit more.     Currently in Pain?  Yes    Pain Score  3     Pain Location  Wrist    Pain Orientation  Right    Pain Descriptors / Indicators  Aching         [facility] OT Assessment - 11/15/18 1637      Assessment   Medical Diagnosis  right wrist pain    Referring Provider (OT)  Dr. Alysia Penna      Precautions   Precautions  Fall               OT Treatments/Exercises (OP) - 11/15/18 0001      Exercises   Exercises  Wrist;Hand;Theraputty      Weighted Stretch Over Towel Roll   Supination - Weighted Stretch  1 pound;60 seconds    Wrist Flexion - Weighted Stretch  1 pound;60 seconds    Wrist Extension - Weighted Stretch  1 pound;60 seconds      Wrist Exercises   Wrist Flexion  PROM;10 reps;AROM;15 reps    Wrist Extension  PROM;10 reps;AROM;15 reps    Wrist Radial Deviation  PROM;10 reps;AROM;15 reps    Wrist Ulnar Deviation  PROM;10 reps;AROM;15 reps    Other wrist exercises  supination, pronation, A/ROM, 12X each      Theraputty   Theraputty - Flatten  seated, flattened putty to improve wrist extension    Theraputty - Roll  gripped with pronated forearm with forearm on table to improve wrist extension    Theraputty - Grip  gripped with pronated forearm for improved wrist extension    Theraputty Hand- Locate Pegs   located 10 beads, focusing on wrist extension during task      Manual Therapy   Manual Therapy  Myofascial release    Manual therapy comments  manual therapy completed seperately from all other therapy interventions this date    Myofascial Release  myofascial release and manual stretching to right flexor and extensor foeram, wrist and hand region to decrease pain and restrictions and improve pain free mobility  in her right wrist region, gentle passive stretch and hold for wrist flexion, extension, supination       Fine Motor Coordination (Hand/Wrist)   Fine Motor Coordination  In hand manipuation training    In Hand Manipulation Training  picked up 10 small beads and moved from pincer grasp to palm.                 OT Short Term Goals - 11/03/18 1547      OT SHORT TERM GOAL #1   Title  Pt will be educated on HEP to improve right wrist mobility required for ADL completion.     Time  4    Period  Weeks    Status  On-going    Target Date  11/26/18      OT SHORT TERM GOAL #2   Title  Pt will decrease right wrist pain to 3/10 or less to improve ability to support herself comfortably when using her walker.     Time  4    Period  Weeks    Status  On-going      OT SHORT TERM GOAL #3   Title  Pt will increase right wrist A/ROM to [facility] to improve ability to curl her hair.     Time  4    Period  Weeks    Status  On-going      OT SHORT TERM GOAL #4   Title  Pt will increase right wrist strength to improve ability to maintain hold on weighted items.     Time  4    Period  Weeks    Status  On-going      OT SHORT TERM GOAL #5   Title  Pt will improve right pinch strength by 2# to increase ability to hold onto tools and utensils during ADLs.     Time  4    Period  Weeks    Status  On-going               Plan - 11/15/18 1634    Clinical Impression Statement  A:  Patient able to complete gripping, locating beads with theraputty this date, with good stretch into wrist  extension.  Patient able to tolerate 60" stretch with 1# resistance for supination, flexion, extension.      Body Structure / Function / Physical Skills  ROM;Flexibility;Endurance;Fascial restriction;Pain;Strength    Plan  P:  Attempt wrist strengthening with 1# weigth, complete putty flattening in standing versus seated position, increase to mod resist red theraputty, and add velco board for wrist extension.        Patient will benefit from skilled therapeutic intervention in order to improve the following deficits and impairments:     Visit Diagnosis: Other symptoms and signs involving the musculoskeletal system  Stiffness of right wrist, not elsewhere classified  Pain in right wrist    Problem List Patient Active Problem List   Diagnosis Date Noted  . Tobacco use disorder 07/09/2018  . Small vessel disease ([place]) 07/09/2018  . Benign essential HTN   . Seizures ([place])   . Tachypnea   . Dysphagia, post-stroke   . Dysarthria 05/19/2018  . Syncope and collapse   . Syncope 08/24/2017  . Closed left femoral fracture ([place]) 04/21/2017  . Seizure disorder ([place]) 04/21/2017  . Closed pertrochanteric fracture of femur, left, initial encounter ([place]) 03/02/2017  . Seizure ([place]) 02/28/2017  . Hip fracture ([place]) 02/28/2017  . Chest pain 12/07/2016  . Atypical chest  pain 12/07/2016  . Tachycardia   . NSTEMI (non-ST elevated myocardial infarction) ([place]) 11/02/2016  . Multifocal atrial tachycardia ([place]) 09/25/2016  . Elevated troponin 09/24/2016  . Hypokalemia 09/23/2016  . TIA (transient ischemic attack) 05/05/2016  . Intractable nausea and vomiting 10/15/2015  . Acute coronary syndrome ([place]) 06/01/2015  . Bronchitis 06/01/2015  . Thrombocytopenia ([place]) 06/01/2015  . Cerebral infarction ([place]) right occipital due to small vessel disease s/p tPA 03/06/2014  . Lower urinary tract infectious disease   . PSVT (paroxysmal supraventricular tachycardia) ([place]) 12/09/2013  . Esophageal varices  ([place]) 03/29/2013  . Hematemesis 03/29/2013  . Hepatic cirrhosis ([place]) 03/29/2013  . Presence of stent in right coronary artery 07/04/2011  . OSA (obstructive sleep apnea) 07/04/2011  . Aortic sclerosis 07/04/2011  . Aortic insufficiency 07/04/2011  . HTN (hypertension) 07/04/2011  . Carotid stenosis, bilateral 07/04/2011  . Chest pain at rest 07/03/2011  . CAD (coronary artery disease) 07/03/2011  . Hypercholesteremia 07/03/2011  . GERD (gastroesophageal reflux disease) 07/03/2011    Vangie Bicker, [place], OTR/L [phone]  11/15/2018, 4:41 PM  [facility] [address] [place], [place], [address] Phone: [phone]   Fax:  [phone]  Name: [name] MRN: [id number] Date of Birth: July 14, 1941

## 2018-11-15 NOTE — Therapy (Signed)
[place], [place], [address] Phone: [phone]   Fax:  [phone]  Physical Therapy Treatment  Patient Details  Name: [name] MRN: [id number] Date of Birth: 06-Jan-1941 Referring Provider (PT): Letta Pate Luanna Salk, MD   Encounter Date: 11/15/2018  PT End of Session - 11/15/18 1449    Visit Number  16    Number of Visits  19    Date for PT Re-Evaluation  11/17/18   Minireassess completed 10/27/18   Authorization Type  UHC Medicare    Authorization Time Period  10/06/18 - 11/17/18    Authorization - Visit Number  16    Authorization - Number of Visits  19    PT Start Time  2426    PT Stop Time  1520    PT Time Calculation (min)  46 min    Equipment Utilized During Treatment  Gait belt    Activity Tolerance  Patient tolerated treatment well;No increased pain    Behavior During Therapy  WFL for tasks assessed/performed       Past Medical History:  Diagnosis Date  . Anemia   . Aortic insufficiency    Moderate  . Back pain   . Carotid stenosis, bilateral   . Cirrhosis ([place])   . Closed left femoral fracture ([place]) 04/21/2017  . Coronary artery disease    Stent x 2 RCA 1995, cardiac catheterization 09/2016 showing only mild atherosclerosis  . DDD (degenerative disc disease), lumbar   . Essential hypertension   . Falls   . GERD (gastroesophageal reflux disease)   . Hiatal hernia   . History of stroke   . Hypercholesteremia   . IBS (irritable bowel syndrome)   . Non-alcoholic fatty liver disease   . OSA (obstructive sleep apnea)    no CPAP  . Pericardial effusion    a. small by echo 2018.  Marland Kitchen PSVT (paroxysmal supraventricular tachycardia) ([place])   . Recurrent UTI   . Seizures ([place])   . Stroke ([place])   . Type 2 diabetes mellitus ([place])    off medication  . Varices, esophageal ([place])     Past Surgical History:  Procedure Laterality Date  . [place]  . APPENDECTOMY    . BACK SURGERY   1985  . CARDIAC CATHETERIZATION  [phone]   Stent to the proximal RCA after MI   . CARDIAC CATHETERIZATION N/A 09/26/2016   Procedure: Left Heart Cath and Coronary Angiography;  Surgeon: Leonie Man, MD;  Location: [place] [facility];  Service: Cardiovascular;  Laterality: N/A;  . CATARACT EXTRACTION W/PHACO Right 07/04/2014   Procedure: CATARACT EXTRACTION PHACO AND INTRAOCULAR LENS PLACEMENT (IOC);  Surgeon: Elta Guadeloupe T. Gershon Crane, MD;  [facility];  Service: Ophthalmology;  Laterality: Right;  CDE:13.13  . COLONOSCOPY    . DILATION AND CURETTAGE OF UTERUS     x2  . Epi Retinal Membrane Peel Left   . ERCP    . ESOPHAGEAL BANDING N/A 04/01/2013   Procedure: ESOPHAGEAL BANDING;  Surgeon: Rogene [place], MD;  [facility];  Service: Endoscopy;  Laterality: N/A;  . ESOPHAGEAL BANDING N/A 05/24/2013   Procedure: ESOPHAGEAL BANDING;  Surgeon: Rogene [place], MD;  [facility];  Service: Endoscopy;  Laterality: N/A;  . ESOPHAGEAL BANDING N/A 06/21/2014   Procedure: ESOPHAGEAL BANDING;  Surgeon: Rogene Houston, MD;  [facility];  Service: Endoscopy;  Laterality: N/A;  . ESOPHAGOGASTRODUODENOSCOPY N/A 04/01/2013  Procedure: ESOPHAGOGASTRODUODENOSCOPY (EGD);  Surgeon: Rogene [place], MD;  [facility];  Service: Endoscopy;  Laterality: N/A;  230-rescheduled to 8:30am Ann notified pt  . ESOPHAGOGASTRODUODENOSCOPY N/A 05/24/2013   Procedure: ESOPHAGOGASTRODUODENOSCOPY (EGD);  Surgeon: Rogene [place], MD;  [facility];  Service: Endoscopy;  Laterality: N/A;  730  . ESOPHAGOGASTRODUODENOSCOPY N/A 06/21/2014   Procedure: ESOPHAGOGASTRODUODENOSCOPY (EGD);  Surgeon: Rogene Houston, MD;  [facility];  Service: Endoscopy;  Laterality: N/A;  930-rescheduled 10/14 @ 1200 Ann to notify pt  . EYE SURGERY  08   cataract surgery of the left eye  . FEMUR IM NAIL Left 03/02/2017   Procedure: INTRAMEDULLARY (IM) NAIL FEMORAL;  Surgeon:  Rod Can, MD;  Location: [facility];  Service: Orthopedics;  Laterality: Left;  . HARDWARE REMOVAL Right 01/17/2013   Procedure: REMOVAL OF HARDWARE AND EXCISION ULNAR STYLOID RIGHT WRIST;  Surgeon: Tennis Must, MD;  Location: [facility];  Service: Orthopedics;  Laterality: Right;  . MYRINGOTOMY  2012   both ears  . ORIF FEMUR FRACTURE Left 04/23/2017   Procedure: OPEN REDUCTION INTERNAL FIXATION (ORIF) DISTAL FEMUR FRACTURE (FRACTURE AROUND FEMORAL NAIL);  Surgeon: Altamese [place], MD;  Location: [facility];  Service: Orthopedics;  Laterality: Left;  . TONSILLECTOMY    . VAGINAL HYSTERECTOMY  1972  . WRIST SURGERY     rt wrist hardwear removal    There were no vitals filed for this visit.  Subjective Assessment - 11/15/18 1440    Subjective  Patient reports she had a great weekend and watched some good movies with family. She states her hip is not bothering her today like it had been. Her Rt wrist is still bothering her and is about 3/10 pain today.     Pertinent History  Stroke on 07/06/18 with administration of TPA per chart review; History of previous TIAs/strokes and history of seizures, DMII, CAD, Carotid artery stenosis    Limitations  Standing;Walking    Diagnostic tests  MRI 07/07/18: "punctate new white matter infarct in the anterior right occipital" "No acute intracranial abnormality" See imaging for further details    Currently in Pain?  Yes    Pain Score  3     Pain Location  Wrist    Pain Orientation  Right    Pain Descriptors / Indicators  Aching    Pain Type  Acute pain    Pain Onset  1 to 4 weeks ago    Pain Frequency  Intermittent    Aggravating Factors   using it    Pain Relieving Factors  rest, massage        [facility] Adult PT Treatment/Exercise - 11/15/18 0001      Knee/Hip Exercises: Standing   Hip Abduction  Both;1 set;15 reps;Knee straight    Abduction Limitations  3lbs    Hip Extension  Both;1 set;15 reps;Knee straight    Extension Limitations   3lbs    Forward Step Up  Left;1 set;10 reps;Hand Hold: 2;Step Height: 4"    Functional Squat  1 set;10 reps    Functional Squat Limitations  Bilateral UE assistance    Other Standing Knee Exercises  1x 15 reps bil LE marchign with 3lbs on ankle       Balance Exercises - 11/15/18 1459      Balance Exercises: Standing   Tandem Gait  Forward;Upper extremity support;2 reps   2x 15' with RW, cues to look up   Other Standing Exercises  Ladder Drill: forward gait with RW and 1 foot to a box        PT Education - 11/15/18 1448    Education Details  Educated on exercises throughout session and on upcoming appointment times.     Person(s) Educated  Patient    Methods  Explanation    Comprehension  Verbalized understanding       PT Short Term Goals - 11/05/18 1255      PT SHORT TERM GOAL #1   Title  Patient will demonstrate understanding and report regular compliance with HEP to improve strength, balance and functional mobility.     Time  3    Period  Weeks    Status  Achieved      PT SHORT TERM GOAL #2   Title  Patient will report ability to ambulate for at least 20 minutes in order to go to the grocery store and pick out food without frequent rest breaks.    Time  3    Period  Weeks    Status  Achieved        PT Long Term Goals - 11/05/18 1502      PT LONG TERM GOAL #1   Title  Patient will demonstrate improvement of at least 1/2 MMT strength grade in all deficient hip and knee muscles in order to improve stability and balance and decrease patient's risk of falls.     Baseline  10/27/18: Patient achieved in some, but not all muscle groups.     Time  6    Period  Weeks    Status  On-going      PT LONG TERM GOAL #2   Title  Patient will demonstrate improvement of at least 10 seconds on the TUG indicating improved functional mobility and balance.     Baseline  10/27/18: Patient improved time on the TUG from 90 seconds to 73 seconds at this re-assessment.     Time  6     Period  Weeks    Status  Achieved      PT LONG TERM GOAL #3   Title  Patient will demonstrate improvement of at least 7 points on the [facility] Balance Scale indicating improved balance, safety, and decreased risk of falls.     Baseline  10/27/18: Patient improved by a couple points, but not by 7 points.     Time  6    Period  Weeks    Status  On-going         Plan - 11/15/18 1449    Clinical Impression Statement  This session continued with current POC. Patient completed standing hip strengthening with ankle weights and continued functional strengthening with squats. She required cues to look up and improve posture during gait training with ladder and tandem to challenge proprioception. She continues to require intermittent seated rest breaks throughout therapy. Patient will conitnue to benefit from skilled PT Interventions to address impairments and progress mobility to improve QOL.    Rehab Potential  Good    Clinical Impairments Affecting Rehab Potential  Positive: Motivated; Negative: Length of time since patient's stroke    PT Frequency  3x / week    PT Duration  6 weeks    PT Treatment/Interventions  ADLs/Self Care Home Management;Aquatic Therapy;DME Instruction;Gait training;Stair training;Functional mobility training;Therapeutic activities;Therapeutic exercise;Balance training;Neuromuscular re-education;Patient/family education;Orthotic Fit/Training;Wheelchair mobility training;Manual techniques;Passive range of motion;Energy conservation;Taping    PT Next Visit Plan  Re-assess next session. Continue with focus on lower extremity strengthening  and balance. Continue ambulation and balance focus and functional LE strengthening.     PT Home Exercise Plan  10/08/18: Constance Haw, seated marching, SLR, LAQ; mini-squats    Consulted and Agree with Plan of Care  Patient       Patient will benefit from skilled therapeutic intervention in order to improve the following deficits and impairments:   Abnormal gait, Decreased balance, Decreased endurance, Decreased mobility, Difficulty walking, Improper body mechanics, Decreased activity tolerance, Decreased strength, Postural dysfunction  Visit Diagnosis: Other symptoms and signs involving the nervous system  Muscle weakness (generalized)  Unsteadiness on feet  Other abnormalities of gait and mobility     Problem List Patient Active Problem List   Diagnosis Date Noted  . Tobacco use disorder 07/09/2018  . Small vessel disease ([place]) 07/09/2018  . Benign essential HTN   . Seizures ([place])   . Tachypnea   . Dysphagia, post-stroke   . Dysarthria 05/19/2018  . Syncope and collapse   . Syncope 08/24/2017  . Closed left femoral fracture ([place]) 04/21/2017  . Seizure disorder ([place]) 04/21/2017  . Closed pertrochanteric fracture of femur, left, initial encounter ([place]) 03/02/2017  . Seizure ([place]) 02/28/2017  . Hip fracture ([place]) 02/28/2017  . Chest pain 12/07/2016  . Atypical chest pain 12/07/2016  . Tachycardia   . NSTEMI (non-ST elevated myocardial infarction) ([place]) 11/02/2016  . Multifocal atrial tachycardia ([place]) 09/25/2016  . Elevated troponin 09/24/2016  . Hypokalemia 09/23/2016  . TIA (transient ischemic attack) 05/05/2016  . Intractable nausea and vomiting 10/15/2015  . Acute coronary syndrome ([place]) 06/01/2015  . Bronchitis 06/01/2015  . Thrombocytopenia ([place]) 06/01/2015  . Cerebral infarction ([place]) right occipital due to small vessel disease s/p tPA 03/06/2014  . Lower urinary tract infectious disease   . PSVT (paroxysmal supraventricular tachycardia) ([place]) 12/09/2013  . Esophageal varices ([place]) 03/29/2013  . Hematemesis 03/29/2013  . Hepatic cirrhosis ([place]) 03/29/2013  . Presence of stent in right coronary artery 07/04/2011  . OSA (obstructive sleep apnea) 07/04/2011  . Aortic sclerosis 07/04/2011  . Aortic insufficiency 07/04/2011  . HTN (hypertension) 07/04/2011  . Carotid stenosis, bilateral 07/04/2011  .  Chest pain at rest 07/03/2011  . CAD (coronary artery disease) 07/03/2011  . Hypercholesteremia 07/03/2011  . GERD (gastroesophageal reflux disease) 07/03/2011    Kipp Brood, PT, DPT, [facility] Physical Therapist with [facility]  11/15/2018 3:28 PM    [facility] [address] [place], [place], [address] Phone: [phone]   Fax:  [phone]  Name: Candis Kabel MRN: [id number] Date of Birth: [birth year]

## 2018-11-17 ENCOUNTER — Ambulatory Visit (HOSPITAL_COMMUNITY): Payer: Medicare Other | Admitting: Physical Therapy

## 2018-11-17 ENCOUNTER — Telehealth (HOSPITAL_COMMUNITY): Payer: Self-pay

## 2018-11-17 ENCOUNTER — Other Ambulatory Visit: Payer: Self-pay

## 2018-11-17 ENCOUNTER — Ambulatory Visit (HOSPITAL_COMMUNITY): Payer: Medicare Other

## 2018-11-17 ENCOUNTER — Encounter: Payer: Self-pay | Admitting: Cardiology

## 2018-11-17 ENCOUNTER — Ambulatory Visit (INDEPENDENT_AMBULATORY_CARE_PROVIDER_SITE_OTHER): Payer: Medicare Other | Admitting: Cardiology

## 2018-11-17 VITALS — BP 138/52 | HR 64 | Ht 63.0 in | Wt 144.0 lb

## 2018-11-17 DIAGNOSIS — R002 Palpitations: Secondary | ICD-10-CM

## 2018-11-17 DIAGNOSIS — R29818 Other symptoms and signs involving the nervous system: Secondary | ICD-10-CM | POA: Diagnosis not present

## 2018-11-17 DIAGNOSIS — M25631 Stiffness of right wrist, not elsewhere classified: Secondary | ICD-10-CM | POA: Diagnosis not present

## 2018-11-17 DIAGNOSIS — M6281 Muscle weakness (generalized): Secondary | ICD-10-CM

## 2018-11-17 DIAGNOSIS — I2510 Atherosclerotic heart disease of native coronary artery without angina pectoris: Secondary | ICD-10-CM

## 2018-11-17 DIAGNOSIS — I350 Nonrheumatic aortic (valve) stenosis: Secondary | ICD-10-CM

## 2018-11-17 DIAGNOSIS — R29898 Other symptoms and signs involving the musculoskeletal system: Secondary | ICD-10-CM | POA: Diagnosis not present

## 2018-11-17 DIAGNOSIS — I6523 Occlusion and stenosis of bilateral carotid arteries: Secondary | ICD-10-CM

## 2018-11-17 DIAGNOSIS — R2681 Unsteadiness on feet: Secondary | ICD-10-CM

## 2018-11-17 DIAGNOSIS — R2689 Other abnormalities of gait and mobility: Secondary | ICD-10-CM | POA: Diagnosis not present

## 2018-11-17 DIAGNOSIS — M25531 Pain in right wrist: Secondary | ICD-10-CM | POA: Diagnosis not present

## 2018-11-17 NOTE — Patient Instructions (Signed)
Medication Instructions:  Your physician recommends that you continue on your current medications as directed. Please refer to the Current Medication list given to you today.   Labwork: I WILL REQUEST LABS FROM PCP.   Testing/Procedures: NONE  Follow-Up: Your physician wants you to follow-up in: October 2020 You will receive a reminder letter in the mail two months in advance. If you don't receive a letter, please call our office to schedule the follow-up appointment.   Any Other Special Instructions Will Be Listed Below (If Applicable).     If you need a refill on your cardiac medications before your next appointment, please call your pharmacy.

## 2018-11-17 NOTE — Telephone Encounter (Signed)
Called mobile number to speak to patient regarding no show. Female answered (family member?) and reports that patient is on her way to her appointment (she has two today. Next one at 2:30PM). Therapist reported that she had missed her appointment at 1:45PM. Person answered that she was unaware of the appointment at 1:45PM.   Ailene Ravel, OTR/L,CBIS  [phone]

## 2018-11-17 NOTE — Therapy (Signed)
[facility] [address] [place], [place], [address] Phone: [phone]   Fax:  [phone]  Physical Therapy Treatment  Patient Details  Name: [name] MRN: [id number] Date of Birth: 03/25/1941 Referring Provider (PT): Letta Pate Luanna Salk, MD   Encounter Date: 11/17/2018  PT End of Session - 11/17/18 1526    Visit Number  17    Number of Visits  19    Date for PT Re-Evaluation  11/17/18   Minireassess completed 10/27/18   Authorization Type  [employer] Medicare    Authorization Time Period  10/06/18 - 11/17/18    Authorization - Visit Number  17    Authorization - Number of Visits  19    PT Start Time  0277    PT Stop Time  1520    PT Time Calculation (min)  45 min    Equipment Utilized During Treatment  Gait belt    Activity Tolerance  Patient tolerated treatment well;No increased pain    Behavior During Therapy  WFL for tasks assessed/performed       Past Medical History:  Diagnosis Date  . Anemia   . Aortic insufficiency    Moderate  . Back pain   . Carotid stenosis, bilateral   . Cirrhosis ([place])   . Closed left femoral fracture ([place]) 04/21/2017  . Coronary artery disease    Stent x 2 RCA 1995, cardiac catheterization 09/2016 showing only mild atherosclerosis  . DDD (degenerative disc disease), lumbar   . Essential hypertension   . Falls   . GERD (gastroesophageal reflux disease)   . Hiatal hernia   . History of stroke   . Hypercholesteremia   . IBS (irritable bowel syndrome)   . Non-alcoholic fatty liver disease   . OSA (obstructive sleep apnea)    no CPAP  . Pericardial effusion    a. small by echo 2018.  Marland Kitchen PSVT (paroxysmal supraventricular tachycardia) ([place])   . Recurrent UTI   . Seizures ([place])   . Stroke ([place])   . Type 2 diabetes mellitus ([place])    off medication  . Varices, esophageal ([place])     Past Surgical History:  Procedure Laterality Date  . [place]  . APPENDECTOMY    . BACK  SURGERY  1985  . CARDIAC CATHETERIZATION  [phone]   Stent to the proximal RCA after MI   . CARDIAC CATHETERIZATION N/A 09/26/2016   Procedure: Left Heart Cath and Coronary Angiography;  Surgeon: Leonie Man, MD;  Location: [facility];  Service: Cardiovascular;  Laterality: N/A;  . CATARACT EXTRACTION W/PHACO Right 07/04/2014   Procedure: CATARACT EXTRACTION PHACO AND INTRAOCULAR LENS PLACEMENT (IOC);  Surgeon: Elta Guadeloupe T. Gershon Crane, MD;  [facility]: [facility];  Service: Ophthalmology;  Laterality: Right;  CDE:13.13  . COLONOSCOPY    . DILATION AND CURETTAGE OF UTERUS     x2  . Epi Retinal Membrane Peel Left   . ERCP    . ESOPHAGEAL BANDING N/A 04/01/2013   Procedure: ESOPHAGEAL BANDING;  Surgeon: Rogene Houston, MD;  [facility];  Service: Endoscopy;  Laterality: N/A;  . ESOPHAGEAL BANDING N/A 05/24/2013   Procedure: ESOPHAGEAL BANDING;  Surgeon: Rogene Houston, MD;  [facility];  Service: Endoscopy;  Laterality: N/A;  . ESOPHAGEAL BANDING N/A 06/21/2014   Procedure: ESOPHAGEAL BANDING;  Surgeon: Rogene Houston, MD;  [facility];  Service: Endoscopy;  Laterality: N/A;  . ESOPHAGOGASTRODUODENOSCOPY N/A 04/01/2013  Procedure: ESOPHAGOGASTRODUODENOSCOPY (EGD);  Surgeon: Rogene Houston, MD;  [facility];  Service: Endoscopy;  Laterality: N/A;  230-rescheduled to 8:30am Ann notified pt  . ESOPHAGOGASTRODUODENOSCOPY N/A 05/24/2013   Procedure: ESOPHAGOGASTRODUODENOSCOPY (EGD);  Surgeon: Rogene Houston, MD;  [facility];  Service: Endoscopy;  Laterality: N/A;  730  . ESOPHAGOGASTRODUODENOSCOPY N/A 06/21/2014   Procedure: ESOPHAGOGASTRODUODENOSCOPY (EGD);  Surgeon: Rogene Houston, MD;  [facility];  Service: Endoscopy;  Laterality: N/A;  930-rescheduled 10/14 @ 1200 Ann to notify pt  . EYE SURGERY  08   cataract surgery of the left eye  . FEMUR IM NAIL Left 03/02/2017   Procedure: INTRAMEDULLARY (IM) NAIL FEMORAL;   Surgeon: Rod Can, MD;  Location: [facility];  Service: Orthopedics;  Laterality: Left;  . HARDWARE REMOVAL Right 01/17/2013   Procedure: REMOVAL OF HARDWARE AND EXCISION ULNAR STYLOID RIGHT WRIST;  Surgeon: Tennis Must, MD;  Location: [facility];  Service: Orthopedics;  Laterality: Right;  . MYRINGOTOMY  2012   both ears  . ORIF FEMUR FRACTURE Left 04/23/2017   Procedure: OPEN REDUCTION INTERNAL FIXATION (ORIF) DISTAL FEMUR FRACTURE (FRACTURE AROUND FEMORAL NAIL);  Surgeon: Altamese [place], MD;  Location: [facility];  Service: Orthopedics;  Laterality: Left;  . TONSILLECTOMY    . VAGINAL HYSTERECTOMY  1972  . WRIST SURGERY     rt wrist hardwear removal    There were no vitals filed for this visit.  Subjective Assessment - 11/17/18 1447    Subjective  pt walks in today with increased antalgia due to increased Lt knee pain.  No known cause, thinking it's just arthritis.    Currently in Pain?  Yes    Pain Score  3     Pain Location  Knee    Pain Orientation  Left    Pain Descriptors / Indicators  Aching    Pain Type  Acute pain                       [facility] Adult PT Treatment/Exercise - 11/17/18 0001      Knee/Hip Exercises: Standing   Hip Abduction  Both;1 set;15 reps;Knee straight    Abduction Limitations  3lbs    Hip Extension  Both;1 set;15 reps;Knee straight    Extension Limitations  3lbs    Forward Step Up  Left;1 set;Hand Hold: 2;Step Height: 4";15 reps    Functional Squat  1 set;15 reps    Functional Squat Limitations  Bilateral UE assistance    Other Standing Knee Exercises  1x 15 reps bil LE marchign with 3lbs on ankle          Balance Exercises - 11/17/18 1557      Balance Exercises: Standing   SLS with Vectors  5 reps   3" holds with bil UE Lt, 1 UE Rt   Tandem Gait  Forward;Upper extremity support;2 reps   max of 5" with Lt LE in back, 15" with Rt LE in back no UE's         PT Short Term Goals - 11/05/18 1255      PT SHORT  TERM GOAL #1   Title  Patient will demonstrate understanding and report regular compliance with HEP to improve strength, balance and functional mobility.     Time  3    Period  Weeks    Status  Achieved      PT SHORT TERM GOAL #2   Title  Patient will report ability to ambulate for at least 20 minutes in order to go to the grocery store and pick out food without frequent rest breaks.    Time  3    Period  Weeks    Status  Achieved        PT Long Term Goals - 11/05/18 1502      PT LONG TERM GOAL #1   Title  Patient will demonstrate improvement of at least 1/2 MMT strength grade in all deficient hip and knee muscles in order to improve stability and balance and decrease patient's risk of falls.     Baseline  10/27/18: Patient achieved in some, but not all muscle groups.     Time  6    Period  Weeks    Status  On-going      PT LONG TERM GOAL #2   Title  Patient will demonstrate improvement of at least 10 seconds on the TUG indicating improved functional mobility and balance.     Baseline  10/27/18: Patient improved time on the TUG from 90 seconds to 73 seconds at this re-assessment.     Time  6    Period  Weeks    Status  Achieved      PT LONG TERM GOAL #3   Title  Patient will demonstrate improvement of at least 7 points on the [facility] Balance Scale indicating improved balance, safety, and decreased risk of falls.     Baseline  10/27/18: Patient improved by a couple points, but not by 7 points.     Time  6    Period  Weeks    Status  On-going            Plan - 11/17/18 1550    Clinical Impression Statement  Pt came late and missed her OT appointment.  Pt with increased Lt knee discomfort, unsure cause and has not happened before.  Pt very slow mobilizing this session and unable to complete Lt LE exercises wihtout use of bil UE's.  Pt required cues for posturing and form, however did not need a rest break during session today.      Rehab Potential  Good    Clinical Impairments  Affecting Rehab Potential  Positive: Motivated; Negative: Length of time since patient's stroke    PT Frequency  3x / week    PT Duration  6 weeks    PT Treatment/Interventions  ADLs/Self Care Home Management;Aquatic Therapy;DME Instruction;Gait training;Stair training;Functional mobility training;Therapeutic activities;Therapeutic exercise;Balance training;Neuromuscular re-education;Patient/family education;Orthotic Fit/Training;Wheelchair mobility training;Manual techniques;Passive range of motion;Energy conservation;Taping    PT Next Visit Plan  Re-assess next session. Continue with focus on lower extremity strengthening and balance. Continue ambulation and balance focus and functional LE strengthening.     PT Home Exercise Plan  10/08/18: Constance Haw, seated marching, SLR, LAQ; mini-squats    Consulted and Agree with Plan of Care  Patient       Patient will benefit from skilled therapeutic intervention in order to improve the following deficits and impairments:  Abnormal gait, Decreased balance, Decreased endurance, Decreased mobility, Difficulty walking, Improper body mechanics, Decreased activity tolerance, Decreased strength, Postural dysfunction  Visit Diagnosis: Unsteadiness on feet  Muscle weakness (generalized)     Problem List Patient Active Problem List   Diagnosis Date Noted  . Tobacco use disorder 07/09/2018  . Small vessel disease ([place]) 07/09/2018  . Benign essential HTN   . Seizures ([place])   . Tachypnea   . Dysphagia, post-stroke   .  Dysarthria 05/19/2018  . Syncope and collapse   . Syncope 08/24/2017  . Closed left femoral fracture ([place]) 04/21/2017  . Seizure disorder ([place]) 04/21/2017  . Closed pertrochanteric fracture of femur, left, initial encounter ([place]) 03/02/2017  . Seizure ([place]) 02/28/2017  . Hip fracture ([place]) 02/28/2017  . Chest pain 12/07/2016  . Atypical chest pain 12/07/2016  . Tachycardia   . NSTEMI (non-ST elevated myocardial infarction) ([place])  11/02/2016  . Multifocal atrial tachycardia ([place]) 09/25/2016  . Elevated troponin 09/24/2016  . Hypokalemia 09/23/2016  . TIA (transient ischemic attack) 05/05/2016  . Intractable nausea and vomiting 10/15/2015  . Acute coronary syndrome ([place]) 06/01/2015  . Bronchitis 06/01/2015  . Thrombocytopenia ([place]) 06/01/2015  . Cerebral infarction ([place]) right occipital due to small vessel disease s/p tPA 03/06/2014  . Lower urinary tract infectious disease   . PSVT (paroxysmal supraventricular tachycardia) ([place]) 12/09/2013  . Esophageal varices ([place]) 03/29/2013  . Hematemesis 03/29/2013  . Hepatic cirrhosis ([place]) 03/29/2013  . Presence of stent in right coronary artery 07/04/2011  . OSA (obstructive sleep apnea) 07/04/2011  . Aortic sclerosis 07/04/2011  . Aortic insufficiency 07/04/2011  . HTN (hypertension) 07/04/2011  . Carotid stenosis, bilateral 07/04/2011  . Chest pain at rest 07/03/2011  . CAD (coronary artery disease) 07/03/2011  . Hypercholesteremia 07/03/2011  . GERD (gastroesophageal reflux disease) 07/03/2011   Teena Irani, PTA/CLT [phone]  Teena Irani 11/17/2018, 4:01 PM  [facility] [address] [place], [place], [address] Phone: [phone]   Fax:  [phone]  Name: [name] MRN: [id number] Date of Birth: 02/08/1941

## 2018-11-17 NOTE — Progress Notes (Signed)
Clinical Summary [name] is a 78 y.o.female seen today for follow up of the following medical problems.   1. Palpitaions/PSVT - previous monitor showed infrequent PACs. Clinically from notes suspected PSVT but does not appear ever captured by monitoring.    - isolated episode since last visit of palpitations.   2. CAD - history of MI in 1995, received 2 stents to RCA - cath 2007 with patent coronaries.  - cath Jan 2018 nonobstructive disease - echo 10/2016 LVEF 65-70%, no WMAs  - admit 10/2016 with elevated troponin to 1.33, Thought to be demand ischemia related to her SVT at the time - admit 12/2016 with chest pain. Mild troponin 0.32 at that time. Managed medically due to recent normal cath and stable echo   - no recent chest pain - compliant with meds  3. Carotid stenosis - 04/2016 moderate bilateral disease  08/2018 mild bilateral disease.  - no recent symptoms.   4. 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 Aortic valve disease - 06/2018 moderate to severe AI, LVEF 65-70%, no LVIDs reported. Does not mention aortic stenosis however mean grad 27 but no AVA VTI given. From 04/2018 study had moderate AS with mean grad 21 and AVA VTI 1.06  - no recent symptoms.    6. History of CVA - admittion 07/2018.   7. Hyperlipidemia 06/2018 TC 109 TG 50 HDL 49 LDL 50    Past Medical History:  Diagnosis Date  . Anemia   . Aortic insufficiency    Moderate  . Back pain   . Carotid stenosis, bilateral   . Cirrhosis ([place])   . Closed left femoral fracture ([place]) 04/21/2017  . Coronary artery disease    Stent x 2 RCA 1995, cardiac catheterization 09/2016 showing only mild atherosclerosis  . DDD (degenerative disc disease), lumbar   . Essential hypertension   . Falls   . GERD (gastroesophageal reflux disease)   . Hiatal hernia   . History of stroke   . Hypercholesteremia   . IBS (irritable bowel syndrome)   . Non-alcoholic fatty liver disease   . OSA  (obstructive sleep apnea)    no CPAP  . Pericardial effusion    a. small by echo 2018.  Marland Kitchen PSVT (paroxysmal supraventricular tachycardia) ([place])   . Recurrent UTI   . Seizures ([place])   . Stroke ([place])   . Type 2 diabetes mellitus ([place])    off medication  . Varices, esophageal ([facility])      Allergies  Allergen Reactions  . Tape Rash and Other (See Comments)    Please use paper tape     Current Outpatient Medications  Medication Sig Dispense Refill  . acetaminophen (TYLENOL) 325 MG tablet Take 2 tablets (650 mg total) by mouth every 6 (six) hours as needed for headache.    Marland Kitchen aspirin EC 81 MG tablet Take 81 mg by mouth daily.    Marland Kitchen atorvastatin (LIPITOR) 80 MG tablet Take 1 tablet (80 mg total) by mouth daily. 30 tablet 1  . calcium citrate (CALCITRATE - DOSED IN MG ELEMENTAL CALCIUM) 950 MG tablet Take 1 tablet (200 mg of elemental calcium total) by mouth daily. 30 tablet 1  . carvedilol (COREG) 12.5 MG tablet Take 1 tablet (12.5 mg total) by mouth 2 (two) times daily with a meal. 60 tablet 1  . clopidogrel (PLAVIX) 75 MG tablet Take 1 tablet (75 mg total) by mouth daily with breakfast. 30 tablet 1  . Furosemide (LASIX  PO) Take by mouth as needed.    Marland Kitchen LORazepam (ATIVAN) 1 MG tablet Take 1 tablet (1 mg total) by mouth daily as needed. 30 tablet 0  . Multiple Vitamins-Minerals (CENTRUM ADULTS) TABS Take 1 tablet by mouth daily.    . niacin (NIASPAN) 500 MG CR tablet Take 1 tablet by mouth daily.    Marland Kitchen omeprazole (PRILOSEC) 20 MG capsule Take 1 capsule (20 mg total) by mouth daily. 90 capsule 3  . ondansetron (ZOFRAN) 4 MG tablet TAKE (1) TABLET BY MOUTH EVERY 8 HOURS AS NEEDED. 30 tablet 0  . Oxcarbazepine (TRILEPTAL) 300 MG tablet Take 1 tablet (300 mg total) by mouth 2 (two) times daily. 60 tablet 1  . ranitidine (ZANTAC) 150 MG tablet Take 1 tablet by mouth 2 (two) times daily as needed.     No current facility-administered medications for this visit.      Past Surgical History:    Procedure Laterality Date  . [place]  . APPENDECTOMY    . BACK SURGERY  1985  . CARDIAC CATHETERIZATION  [phone]   Stent to the proximal RCA after MI   . CARDIAC CATHETERIZATION N/A 09/26/2016   Procedure: Left Heart Cath and Coronary Angiography;  Surgeon: Leonie Man, MD;  Location: [facility];  Service: Cardiovascular;  Laterality: N/A;  . CATARACT EXTRACTION W/PHACO Right 07/04/2014   Procedure: CATARACT EXTRACTION PHACO AND INTRAOCULAR LENS PLACEMENT (IOC);  Surgeon: Elta Guadeloupe T. Gershon Crane, MD;  [facility];  Service: Ophthalmology;  Laterality: Right;  CDE:13.13  . COLONOSCOPY    . DILATION AND CURETTAGE OF UTERUS     x2  . Epi Retinal Membrane Peel Left   . ERCP    . ESOPHAGEAL BANDING N/A 04/01/2013   Procedure: ESOPHAGEAL BANDING;  Surgeon: Rogene Houston, MD;  [facility];  Service: Endoscopy;  Laterality: N/A;  . ESOPHAGEAL BANDING N/A 05/24/2013   Procedure: ESOPHAGEAL BANDING;  Surgeon: Rogene Houston, MD;  [facility];  Service: Endoscopy;  Laterality: N/A;  . ESOPHAGEAL BANDING N/A 06/21/2014   Procedure: ESOPHAGEAL BANDING;  Surgeon: Rogene Houston, MD;  [facility];  Service: Endoscopy;  Laterality: N/A;  . ESOPHAGOGASTRODUODENOSCOPY N/A 04/01/2013   Procedure: ESOPHAGOGASTRODUODENOSCOPY (EGD);  Surgeon: Rogene Houston, MD;  [facility];  Service: Endoscopy;  Laterality: N/A;  230-rescheduled to 8:30am Ann notified pt  . ESOPHAGOGASTRODUODENOSCOPY N/A 05/24/2013   Procedure: ESOPHAGOGASTRODUODENOSCOPY (EGD);  Surgeon: Rogene Houston, MD;  [facility];  Service: Endoscopy;  Laterality: N/A;  730  . ESOPHAGOGASTRODUODENOSCOPY N/A 06/21/2014   Procedure: ESOPHAGOGASTRODUODENOSCOPY (EGD);  Surgeon: Rogene Houston, MD;  [facility];  Service: Endoscopy;  Laterality: N/A;  930-rescheduled 10/14 @ 1200 Ann to notify pt  . EYE SURGERY  08   cataract surgery of the left  eye  . FEMUR IM NAIL Left 03/02/2017   Procedure: INTRAMEDULLARY (IM) NAIL FEMORAL;  Surgeon: Rod Can, MD;  Location: [facility];  Service: Orthopedics;  Laterality: Left;  . HARDWARE REMOVAL Right 01/17/2013   Procedure: REMOVAL OF HARDWARE AND EXCISION ULNAR STYLOID RIGHT WRIST;  Surgeon: Tennis Must, MD;  Location: [facility];  Service: Orthopedics;  Laterality: Right;  . MYRINGOTOMY  2012   both ears  . ORIF FEMUR FRACTURE Left 04/23/2017   Procedure: OPEN REDUCTION INTERNAL FIXATION (ORIF) DISTAL FEMUR FRACTURE (FRACTURE AROUND FEMORAL NAIL);  Surgeon: Altamese [place], MD;  Location: [facility];  Service: Orthopedics;  Laterality: Left;  . TONSILLECTOMY    . VAGINAL HYSTERECTOMY  1972  . WRIST SURGERY     rt wrist hardwear removal     Allergies  Allergen Reactions  . Tape Rash and Other (See Comments)    Please use paper tape      Family History  Problem Relation Age of Onset  . Diabetes Mother   . Heart failure Father   . Heart failure Maternal Aunt      Social History [name] reports that she has been smoking cigarettes. She has a 12.50 pack-year smoking history. She has never used smokeless tobacco. [name] reports no history of alcohol use.   Review of Systems CONSTITUTIONAL: No weight loss, fever, chills, weakness or fatigue.  HEENT: Eyes: No visual loss, blurred vision, double vision or yellow sclerae.No hearing loss, sneezing, congestion, runny nose or sore throat.  SKIN: No rash or itching.  CARDIOVASCULAR: per hpi RESPIRATORY: No shortness of breath, cough or sputum.  GASTROINTESTINAL: No anorexia, nausea, vomiting or diarrhea. No abdominal pain or blood.  GENITOURINARY: No burning on urination, no polyuria NEUROLOGICAL: No headache, dizziness, syncope, paralysis, ataxia, numbness or tingling in the extremities. No change in bowel or bladder control.  MUSCULOSKELETAL: No muscle, back pain, joint pain or stiffness.  LYMPHATICS: No enlarged  nodes. No history of splenectomy.  PSYCHIATRIC: No history of depression or anxiety.  ENDOCRINOLOGIC: No reports of sweating, cold or heat intolerance. No polyuria or polydipsia.  Marland Kitchen   Physical Examination Today's Vitals   11/17/18 1130  BP: (!) 138/52  Pulse: 64  SpO2: 95%  Weight: 144 lb (65.3 kg)  Height: 5' 3"  (1.6 m)   Body mass index is 25.51 kg/m.  Gen: resting comfortably, no acute distress HEENT: no scleral icterus, pupils equal round and reactive, no palptable cervical adenopathy,  CV: RRR, 2/6 systolic murmur rusb, no jvd Resp: Clear to auscultation bilaterally GI: abdomen is soft, non-tender, non-distended, normal bowel sounds, no hepatosplenomegaly MSK: extremities are warm, no edema.  Skin: warm, no rash Neuro:  no focal deficits Psych: appropriate affect   Diagnostic Studies  06/2018 echo Study Conclusions  - Left ventricle: The cavity size was normal. Wall thickness was   increased in a pattern of moderate LVH. Systolic function was   vigorous. The estimated ejection fraction was in the range of 65%   to 70%. Features are consistent with a pseudonormal left   ventricular filling pattern, with concomitant abnormal relaxation   and increased filling pressure (grade 2 diastolic dysfunction). - Aortic valve: Mildly calcified annulus. There was moderate to   severe regurgitation. - Mitral valve: There was mild to moderate regurgitation. - Left atrium: The atrium was severely dilated.  Impressions:  - Technically difficult echo   No parasternal views.     LV systolic function   The aortic insufficiency is at least moderate - severe in   severity .   08/2018 carotid US Summary: Right Carotid: Velocities in the right ICA are consistent with a 1-39% stenosis.  Left Carotid: Velocities in the left ICA are consistent with a 1-39% stenosis,               calcific plaque may obscure higher velicity.   Assessment and Plan  1. Palpitations - no  significant symptoms, continue current meds   2. CAD -prior cath with nonobstructive disease. - no recent symptoms, continue to monitor.   3. Carotid stenosis - continue medical therapy  4. Aortic  stenosis - will needs repeat echo late this year.      Arnoldo Lenis, M.D.

## 2018-11-19 ENCOUNTER — Telehealth (HOSPITAL_COMMUNITY): Payer: Self-pay | Admitting: Physical Therapy

## 2018-11-19 ENCOUNTER — Ambulatory Visit (HOSPITAL_COMMUNITY): Payer: Medicare Other | Admitting: Physical Therapy

## 2018-11-19 NOTE — Telephone Encounter (Signed)
Therapist called regarding patient not showing for appointment at 2:30 PM. Patient did not answer, and therapist left a message reminding patient of next scheduled appointment and reminding her to call and cancel if she is unable to attend, and left phone number.  Clarene Critchley PT, DPT 3:43 PM, 11/19/18 [phone]

## 2018-11-24 ENCOUNTER — Ambulatory Visit (HOSPITAL_COMMUNITY): Payer: Medicare Other | Admitting: Occupational Therapy

## 2018-11-24 ENCOUNTER — Ambulatory Visit (HOSPITAL_COMMUNITY): Payer: Medicare Other

## 2018-11-25 ENCOUNTER — Ambulatory Visit (HOSPITAL_COMMUNITY): Payer: Medicare Other | Admitting: Occupational Therapy

## 2018-11-26 ENCOUNTER — Telehealth (HOSPITAL_COMMUNITY): Payer: Self-pay

## 2018-11-26 NOTE — Telephone Encounter (Signed)
Called and left message concerning need to cancel OT/PT apts for the next 2 weeks minimal to reduce spread of COVID-19.  [address], [place]; [facility] [phone]

## 2018-11-28 IMAGING — MR MR HEAD W/O CM
8 of 10 series · 40 of 48 positions shown · non-contrast
Comparison: CT head and CTA and CTP head and neck earlier today.
Brain MRI 03/01/2017.

CLINICAL DATA: 76 year old female with slurred speech and left side
weakness. Severe left ICA and right PCA P2 stenosis.

EXAM:
MRI HEAD WITHOUT CONTRAST
TECHNIQUE: Multiplanar, multiecho pulse sequences of the brain and surrounding
structures were obtained without intravenous contrast.

[Series 4: DWI · axial · 3.0mm · 0.70mm/px · z∈[-97,+53]mm · 6 of 50 slices shown (1 of 4)]
[im 1/50]
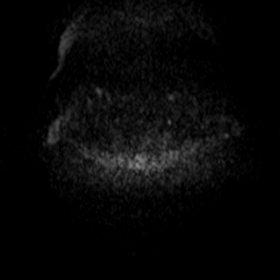
[im 10/50]
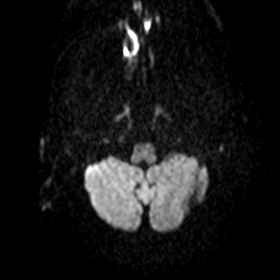
[im 20/50]
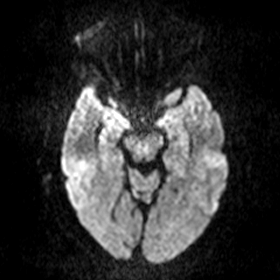
[im 30/50]
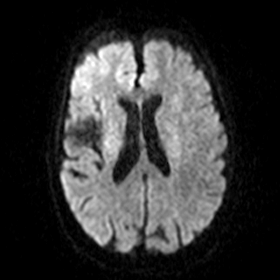
[im 40/50]
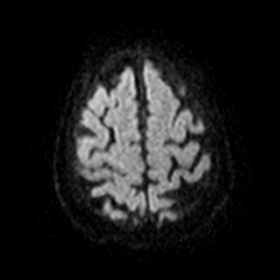
[im 50/50]
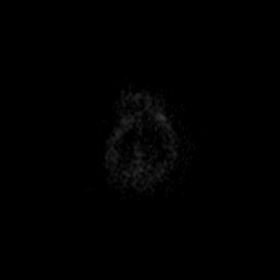

[Series 5: DWI · axial · 3.0mm · 0.77mm/px · z∈[-97,+53]mm · 6 of 51 slices shown (2 of 4)]
[im 1/51]
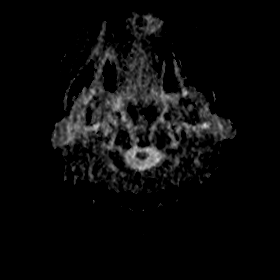
[im 11/51]
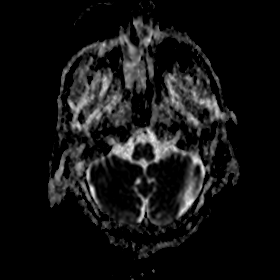
[im 21/51]
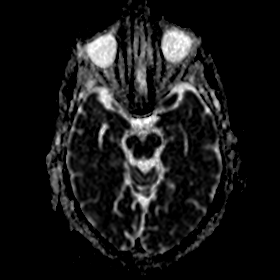
[im 31/51]
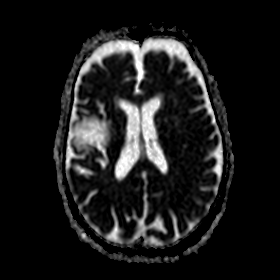
[im 41/51]
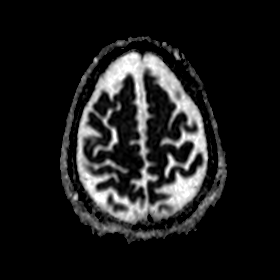
[im 51/51]
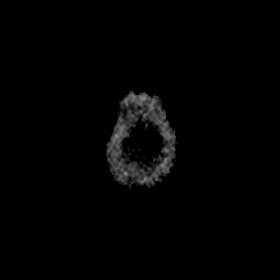

[Series 6: DWI · coronal · 5.0mm · 0.45mm/px · 4 of 34 slices shown (3 of 4)]
[im 1/34]
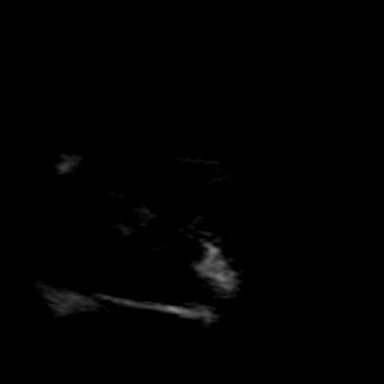
[im 12/34]
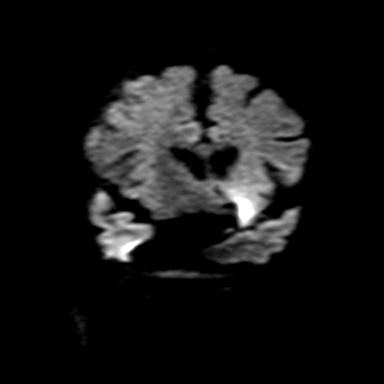
[im 23/34]
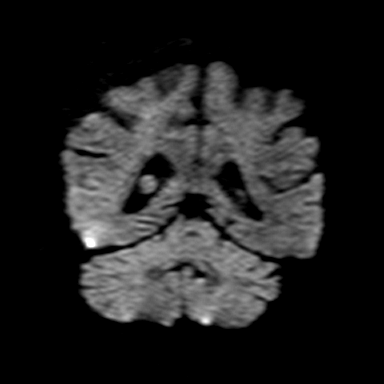
[im 34/34]
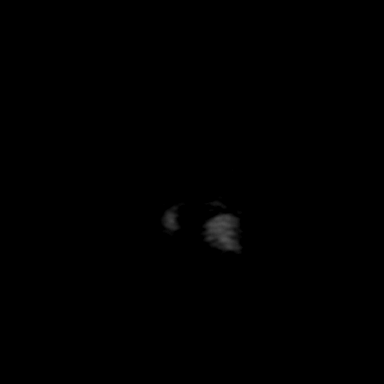

[Series 7: DWI · coronal · 5.0mm · 0.46mm/px · 4 of 33 slices shown (4 of 4)]
[im 1/33]
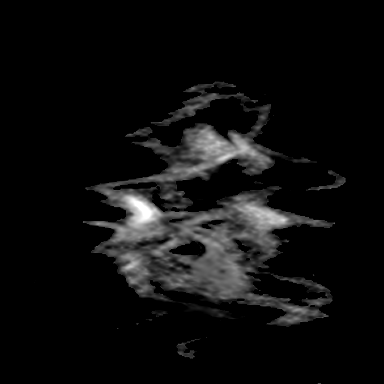
[im 11/33]
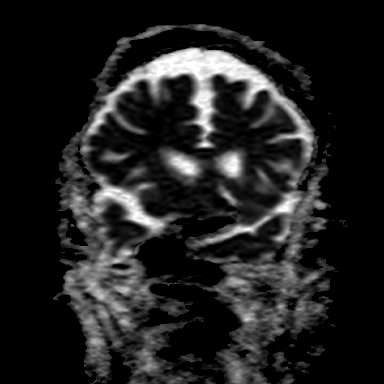
[im 22/33]
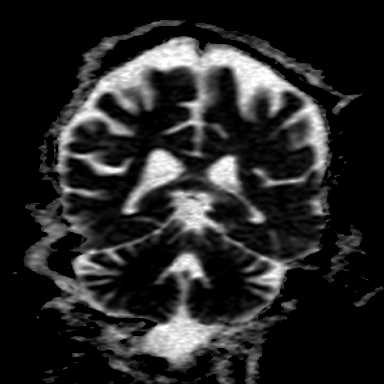
[im 33/33]
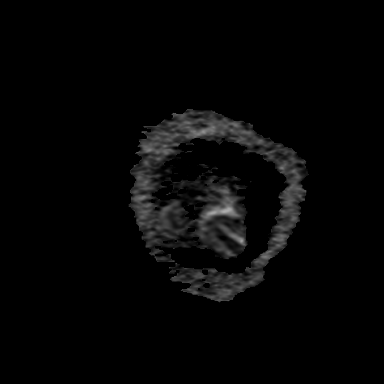

[Series 8: T2 · axial · 5.0mm · 0.65mm/px · z∈[-93,+50]mm · 3 of 23 slices shown (1 of 2)]
[im 1/23]
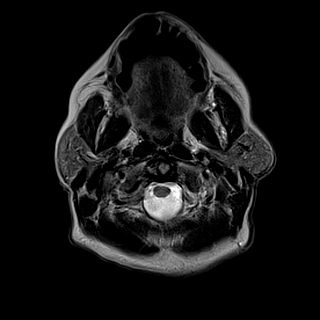
[im 12/23]
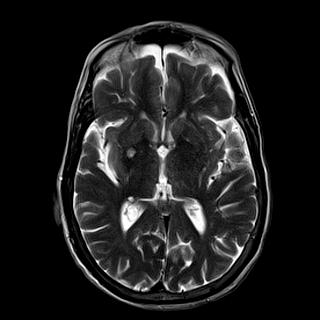
[im 23/23]
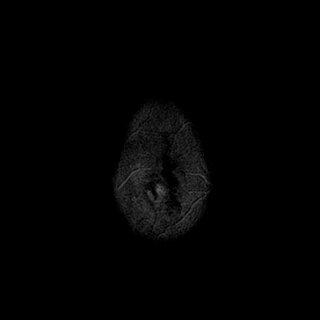

[Series 9: FLAIR · axial · 3.0mm · 0.82mm/px · z∈[-91,+47]mm · 6 of 47 slices shown]
[im 1/47]
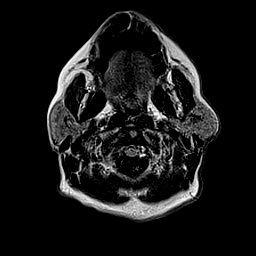
[im 10/47]
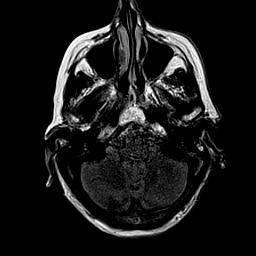
[im 19/47]
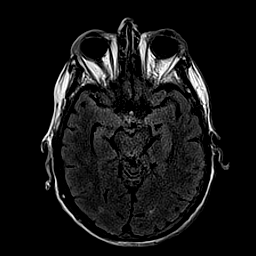
[im 28/47]
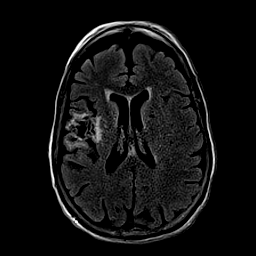
[im 37/47]
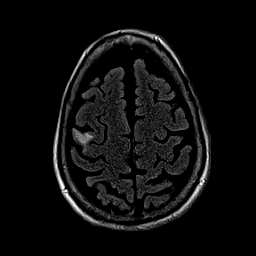
[im 47/47]
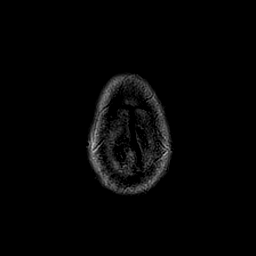

[Series 10: T1 · axial · 2.0mm · 0.41mm/px · z∈[-97,+26]mm · 7 of 72 slices shown]
[im 1/72]
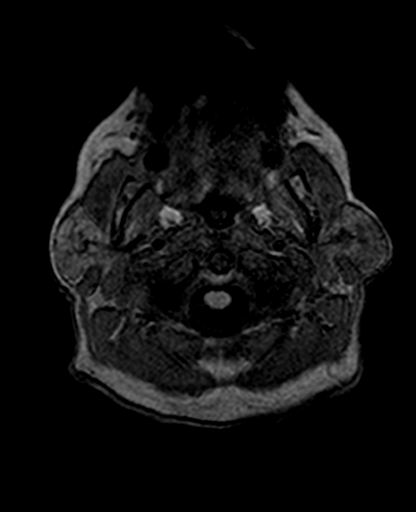
[im 9/72]
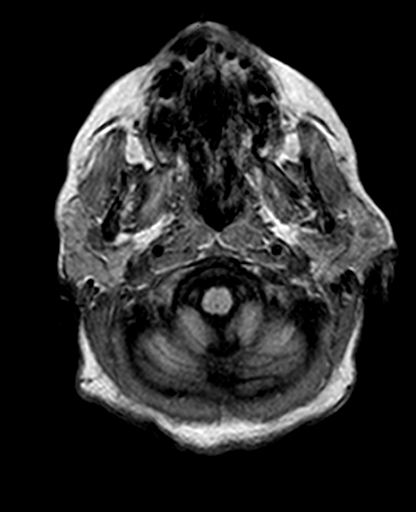
[im 18/72]
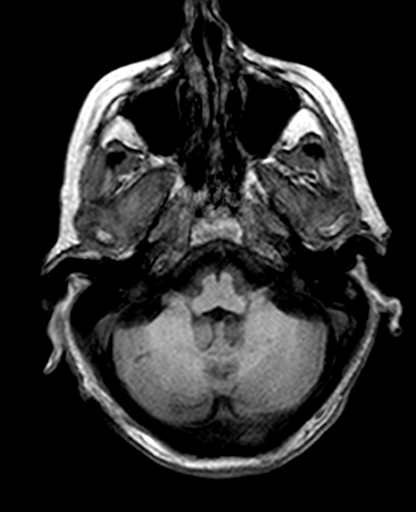
[im 27/72]
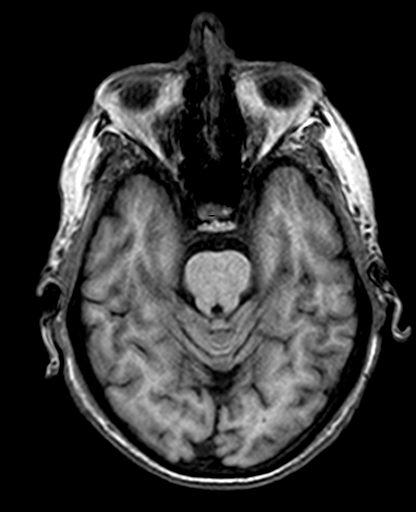
[im 45/72]
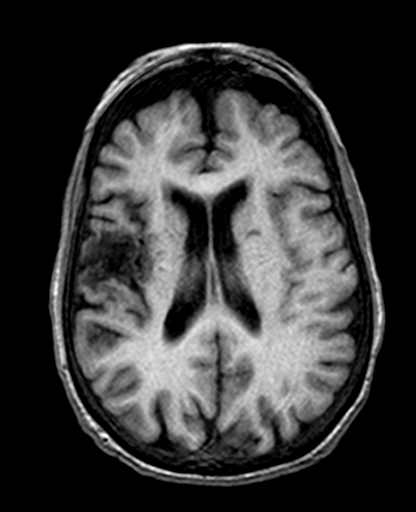
[im 54/72]
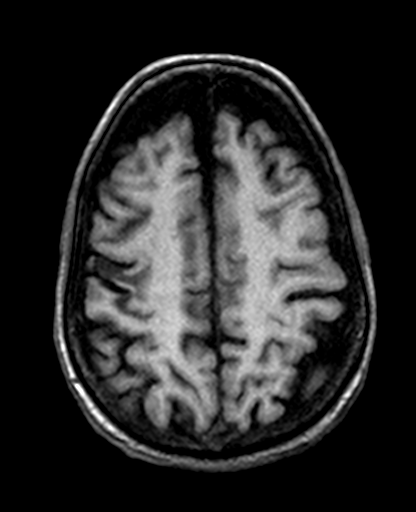
[im 63/72]
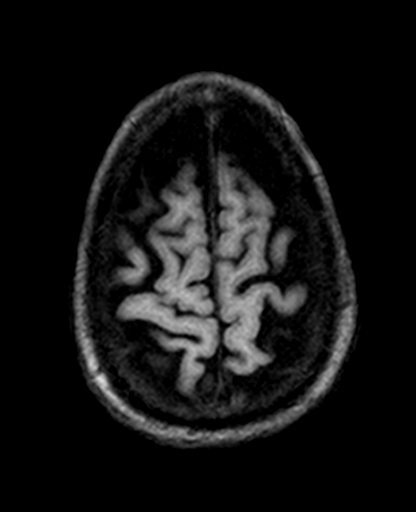

[Series 12: T2 · coronal · 5.0mm · 0.55mm/px · 4 of 28 slices shown (2 of 2)]
[im 1/28]
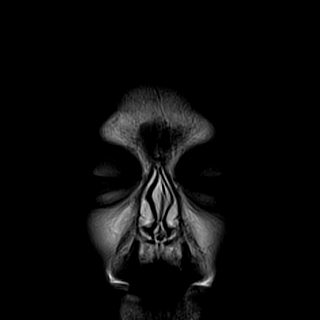
[im 10/28]
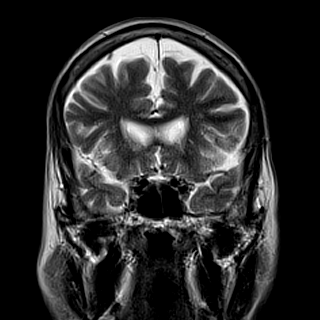
[im 19/28]
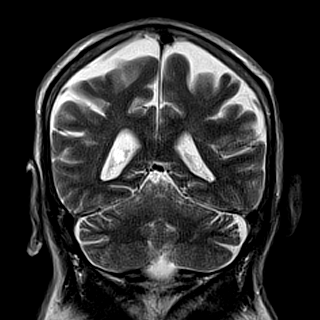
[im 28/28]
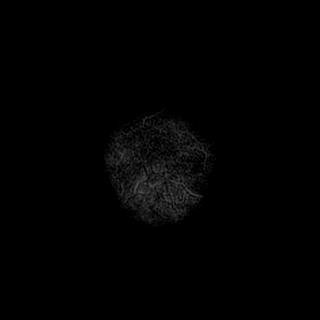

[40 of 48 positions shown; findings below may reference images not displayed]

FINDINGS: Brain: Punctate left peri rolandic, superior motor strip region,
cortex focus of restricted diffusion (series 4, image 89) with faint
T2 and FLAIR hyperintensity. No hemorrhage or mass effect.

No other restricted diffusion. Chronic right MCA, operculum infarct
with encephalomalacia is stable since 1200. Occasional scattered
small cerebral white matter T2 and FLAIR hyperintense foci elsewhere
stable. Several small chronic infarcts in the right cerebellum are
stable. Chronic left thalamic and basal ganglia lacunar infarcts
re-demonstrated. No hemosiderin.

No midline shift, mass effect, evidence of mass lesion,
ventriculomegaly, extra-axial collection or acute intracranial
hemorrhage. Cervicomedullary junction and pituitary are within
normal limits.

Vascular: Major intracranial vascular flow voids are stable since
1200.

Skull and upper cervical spine: Stable visible cervical spine.
Visualized bone marrow signal is within normal limits.

Sinuses/Orbits: Stable and negative.

Other: Mild right mastoid effusion has regressed since 1200. Trace
left mastoid fluid is stable. Visible internal auditory structures
appear normal. Negative scalp and face soft tissues.
IMPRESSION: 1. Punctate acute lacunar infarct in the superior left peri rolandic
cortex with no hemorrhage or mass effect. If symptomatic, right side
weakness would be expected.
2. Otherwise stable chronic ischemic disease since [DATE].

## 2018-11-28 IMAGING — CT CT HEAD CODE STROKE
3 series · 15 of 47 positions shown, 18 images · non-contrast
Comparison: 04/12/2018 CT head.

CLINICAL DATA: Code stroke. 76 y/o F; left-sided weakness and
slurred speech.

EXAM:
CT HEAD WITHOUT CONTRAST
TECHNIQUE: Contiguous axial images were obtained from the base of the skull
through the vertex without intravenous contrast.

[Series 2: head code stroke wo · axial · 0.47mm/px · z∈[+1409,+1534]mm · 9 of 30 slices shown, 12 images]
[im 3/30  brain]
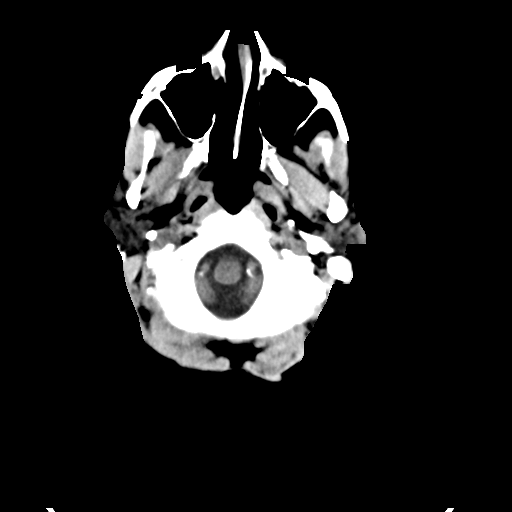
[im 3/30  bone]
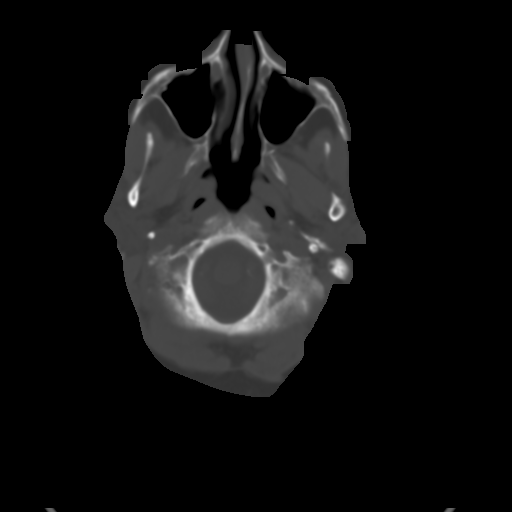
[im 6/30  brain]
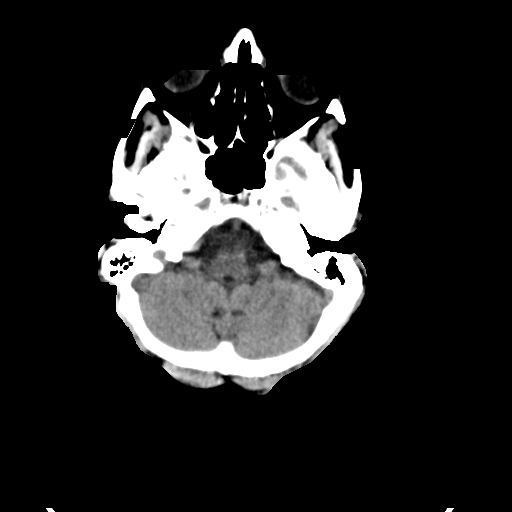
[im 9/30  brain]
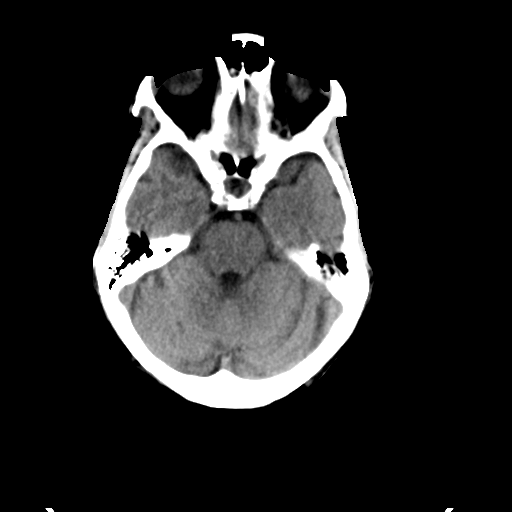
[im 12/30  brain]
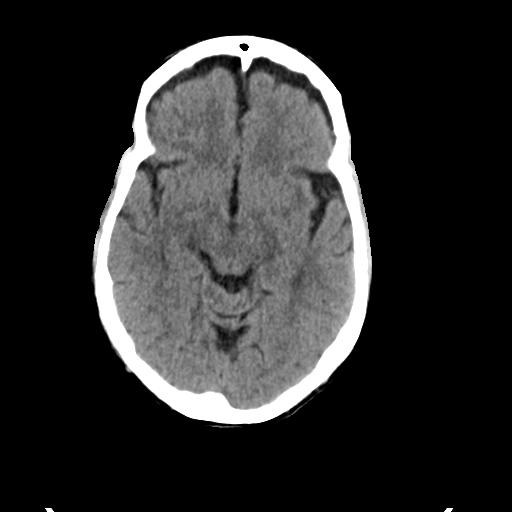
[im 16/30  brain]
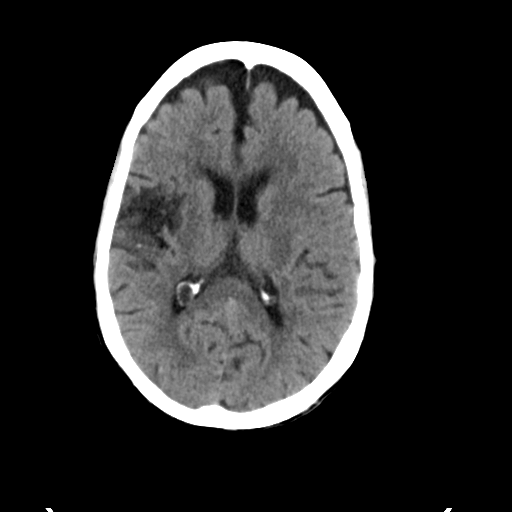
[im 16/30  bone]
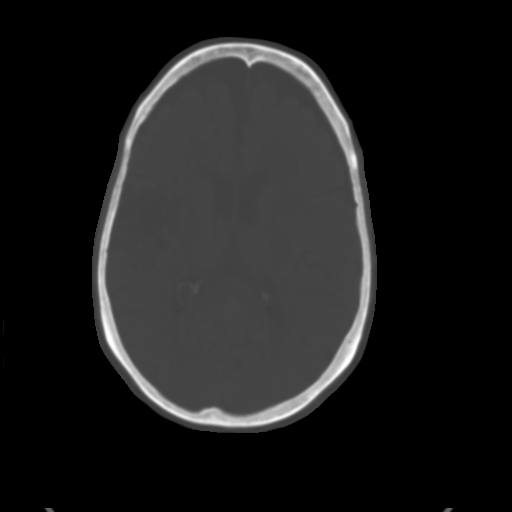
[im 19/30  brain]
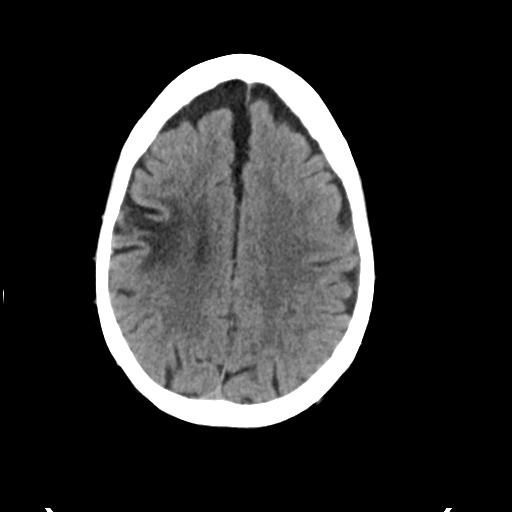
[im 22/30  brain]
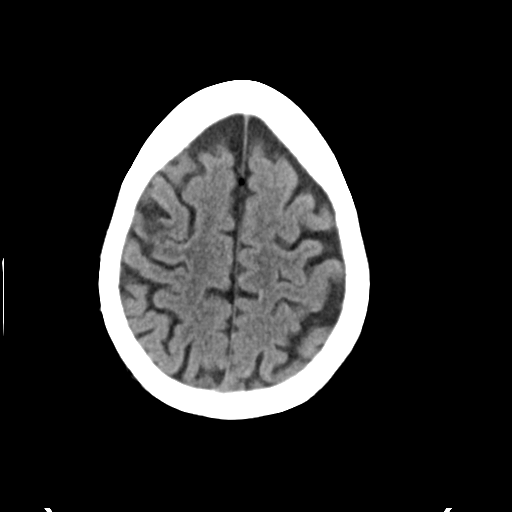
[im 25/30  brain]
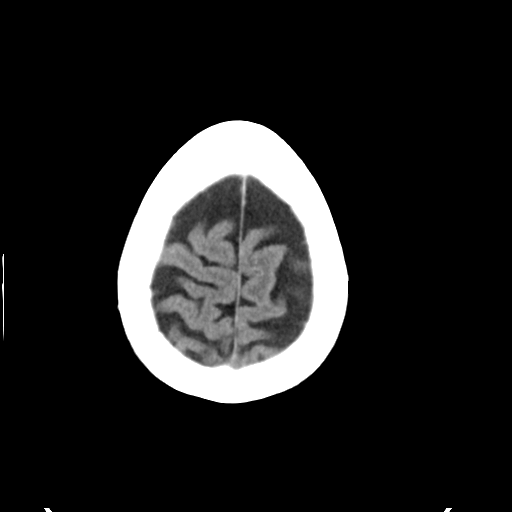
[im 28/30  brain]
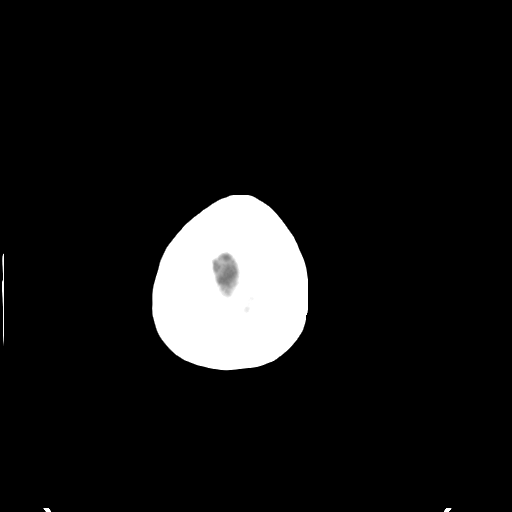
[im 28/30  bone]
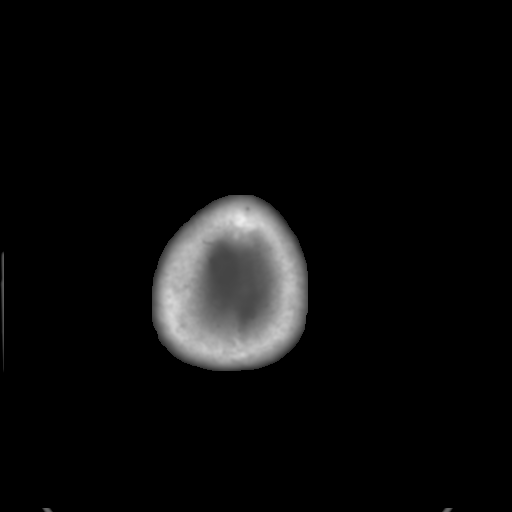

[Series 4: coronal soft tissue · coronal · 0.29mm/px · 3 of 70 slices shown]
[im 24/70  brain]
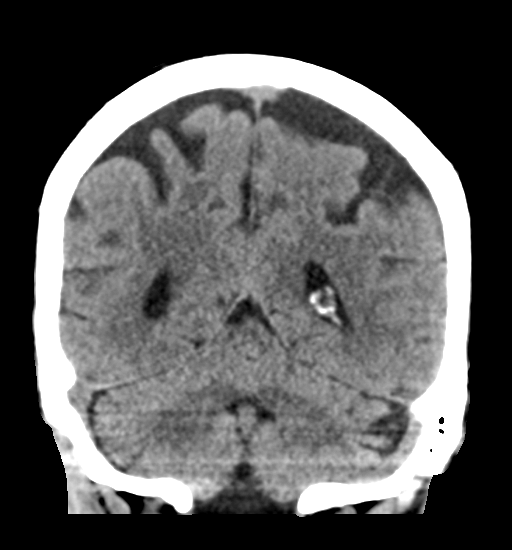
[im 31/70  brain]
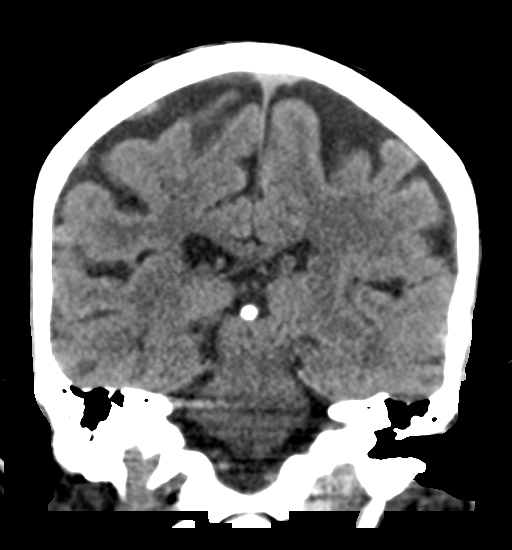
[im 39/70  brain]
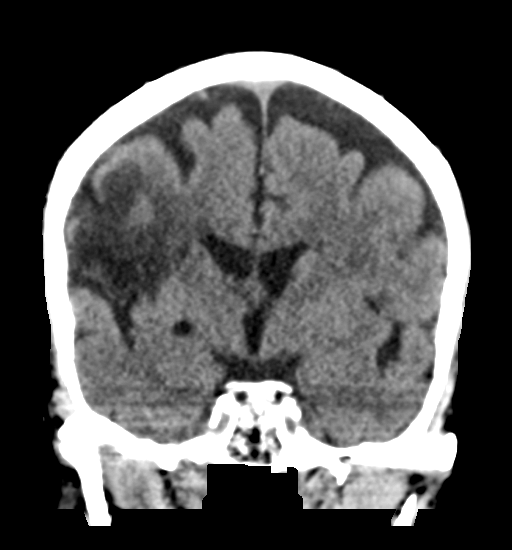

[Series 5: sagittal soft tissue · sagittal · 0.29mm/px · 3 of 51 slices shown]
[im 17/51  brain]
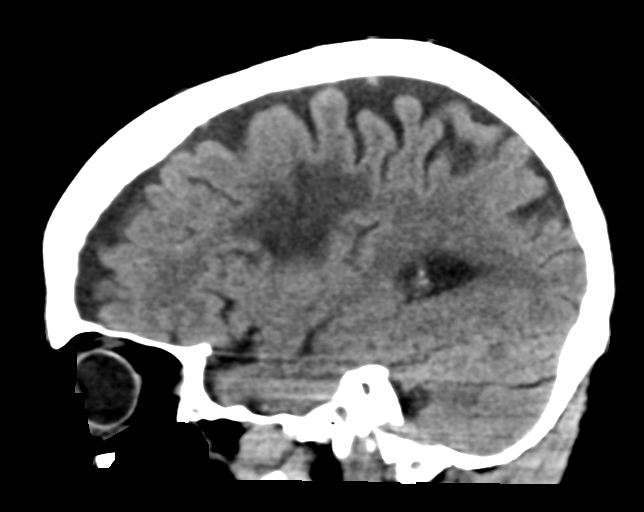
[im 26/51  brain]
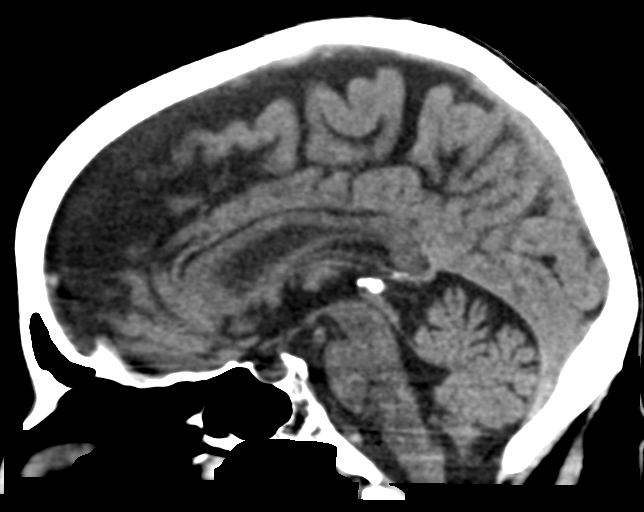
[im 34/51  brain]
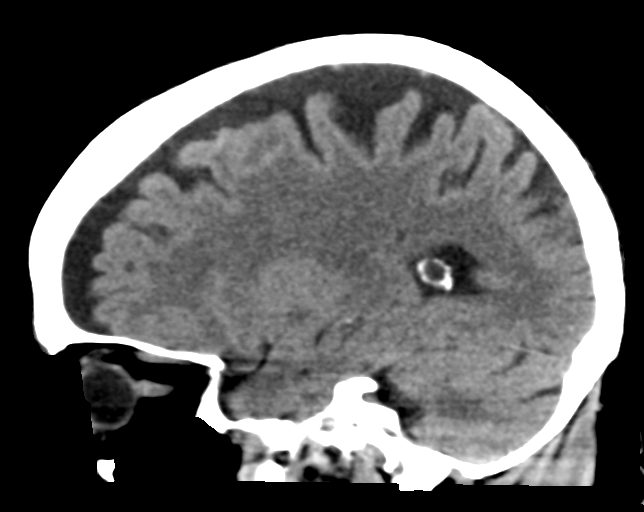

[15 of 47 positions shown; findings below may reference images not displayed]

FINDINGS: Brain: No evidence of acute infarction, hemorrhage, hydrocephalus,
extra-axial collection or mass lesion/mass effect. Stable chronic
infarction of the right lateral frontal lobe and superior insula.
Multiple small chronic infarctions are present in the bilateral
caudate heads, anterior limbs of internal capsule, bilateral
lentiform nuclei, and bilateral thalami. Additionally, there is a
small chronic infarction in the right inferior cerebellar
hemisphere. Stable mild volume loss of the brain.

Vascular: Calcific atherosclerosis of carotid siphons. No proximal
hyperdense vessel identified.

Skull: Normal. Negative for fracture or focal lesion.

Sinuses/Orbits: No acute finding.

Other: Bilateral intra-ocular lens replacement.

ASPECTS (Alberta Stroke Program Early CT Score)

- Ganglionic level infarction (caudate, lentiform nuclei, internal
capsule, insula, M1-M3 cortex): 7

- Supraganglionic infarction (M4-M6 cortex): 3

Total score (0-10 with 10 being normal): 10
IMPRESSION: 1. No acute intracranial abnormality identified.
2. Stable chronic infarction of the right lateral frontal lobe and
superior insula. Stable multiple small chronic infarctions in the
basal ganglia and right inferior cerebellum. Stable mild volume loss
of the brain.
3. ASPECTS is 10

These results were called by telephone at the time of interpretation
on 05/07/2018 at [DATE] to Dr. JAMEELAH CIRILO , who verbally
acknowledged these results.

By: Dedunu Tharaka Budiari M.D.

## 2018-11-28 IMAGING — CT CT ANGIO CHEST-ABD-PELV FOR DISSECTION W/ AND WO/W CM
2 of 6 series · 11 of 36 positions shown, 15 images · IV contrast (Isovue)
Comparison: CT of the abdomen pelvis dated 01/11/2018

CLINICAL DATA: 76-year-old female with slurred speech and
left-sided weakness. Evaluate for aortic dissection.

EXAM:
CT ANGIOGRAPHY CHEST, ABDOMEN AND PELVIS
TECHNIQUE: Multidetector CT imaging through the chest, abdomen and pelvis was
performed using the standard protocol during bolus administration of
intravenous contrast. Multiplanar reconstructed images and MIPs were
obtained and reviewed to evaluate the vascular anatomy.
CONTRAST:  175mL LFYMIW-FB5 IOPAMIDOL (LFYMIW-FB5) INJECTION 76%

[Series 4: dissection axial arterial · axial · arterial · 0.88mm/px · z∈[+755,+1274]mm · 10 of 199 slices shown, 13 images]
[im 13/199  mediastinal]
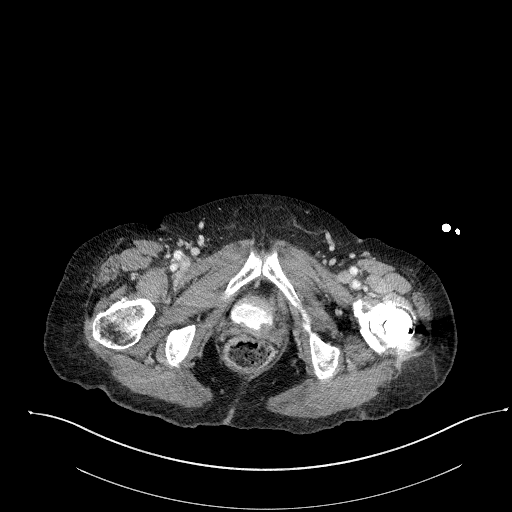
[im 13/199  bone]
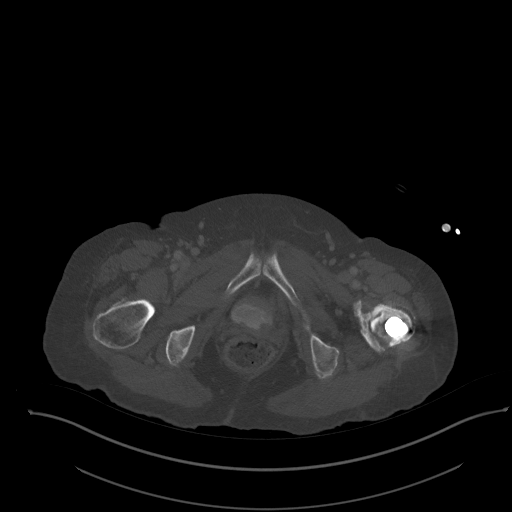
[im 38/199  mediastinal]
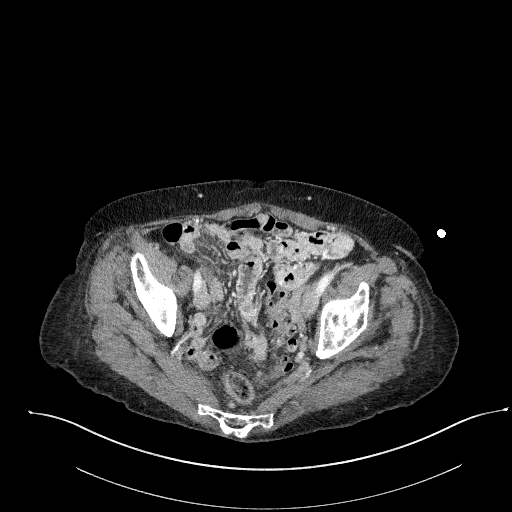
[im 62/199  mediastinal]
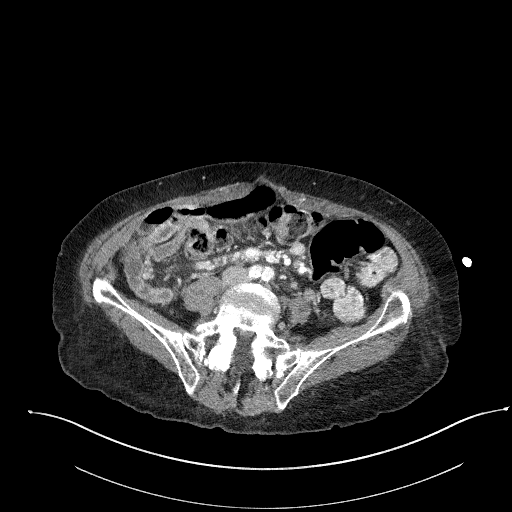
[im 87/199  mediastinal]
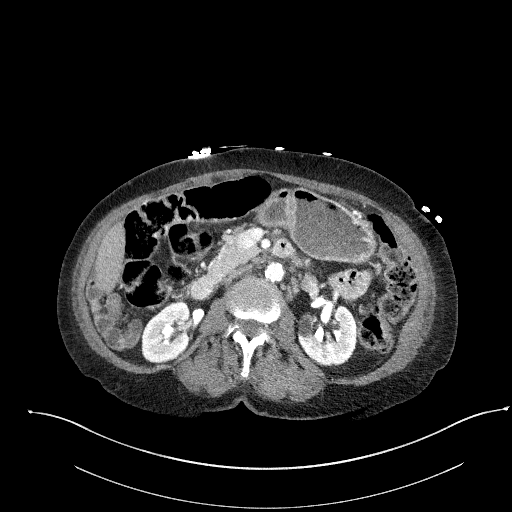
[im 112/199  mediastinal]
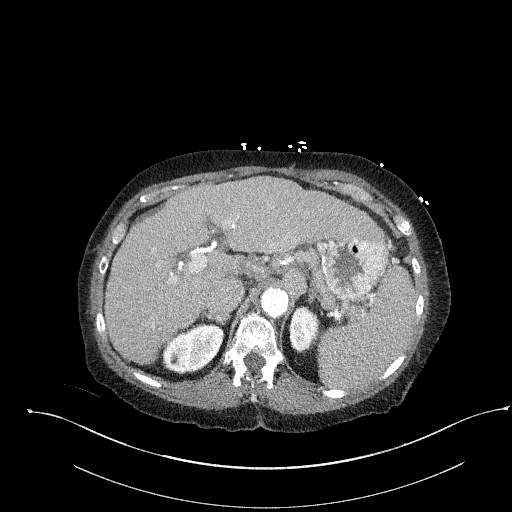
[im 137/199  mediastinal]
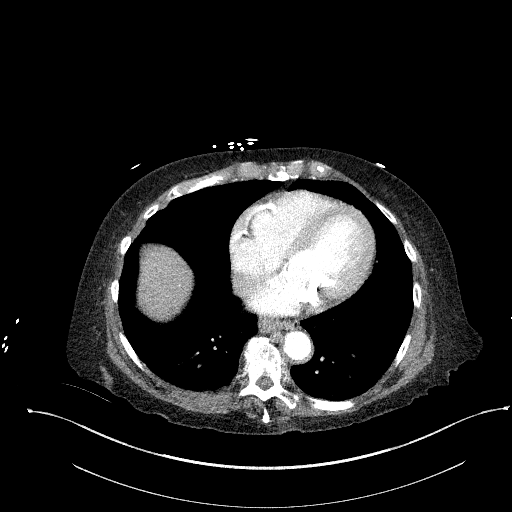
[im 149/199  lung]
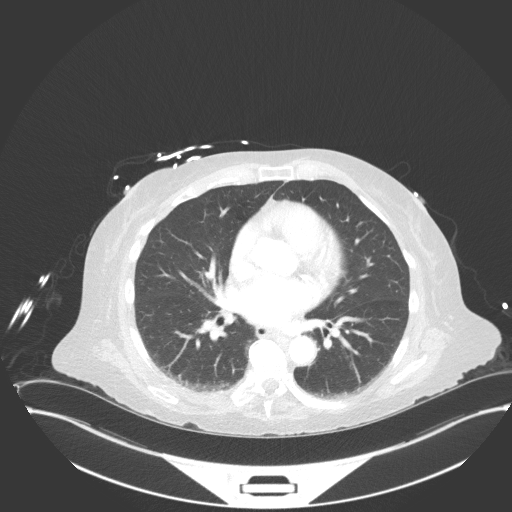
[im 161/199  mediastinal]
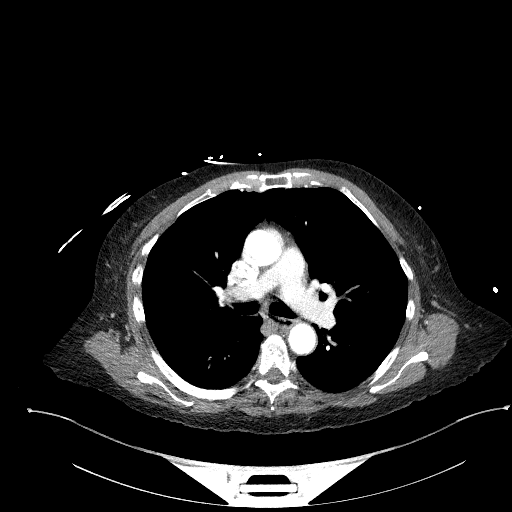
[im 161/199  lung]
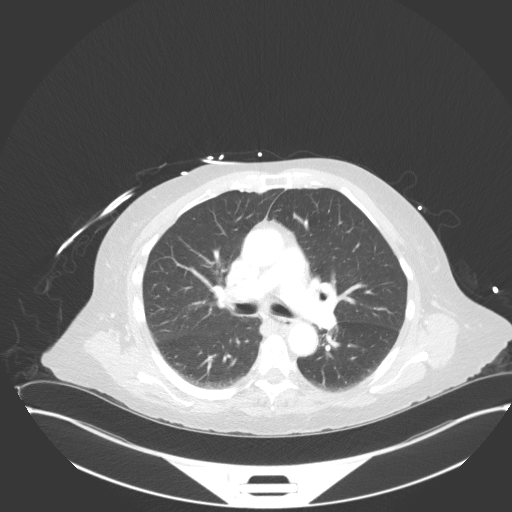
[im 174/199  lung]
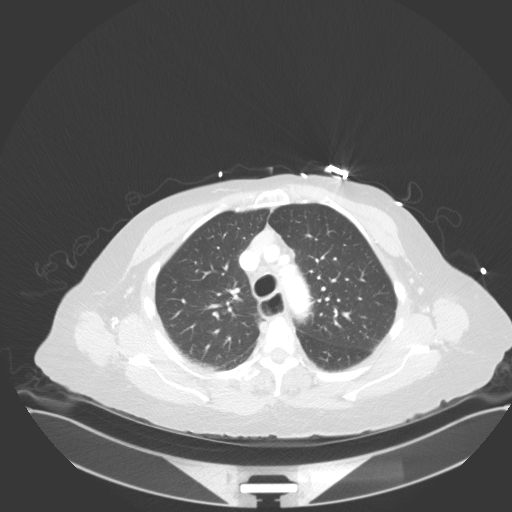
[im 186/199  mediastinal]
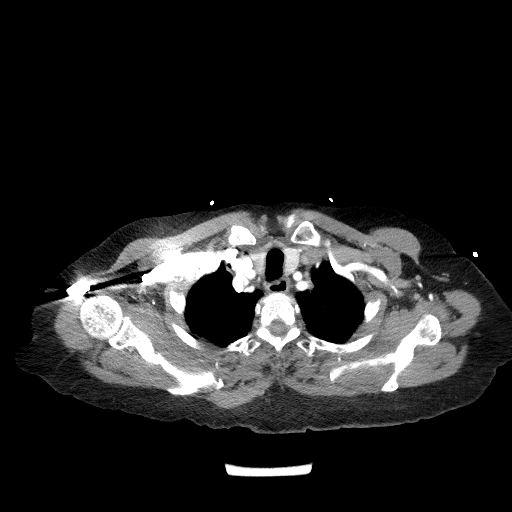
[im 186/199  lung]
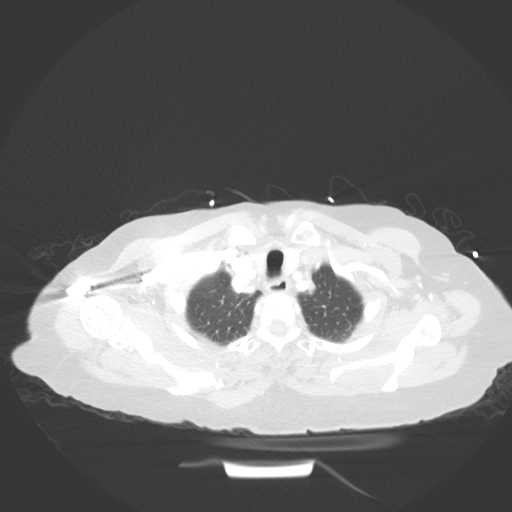

[Series 8: coronals · coronal · 0.65mm/px · 1 of 116 slices shown, 2 images]
[im 58/116  mediastinal]
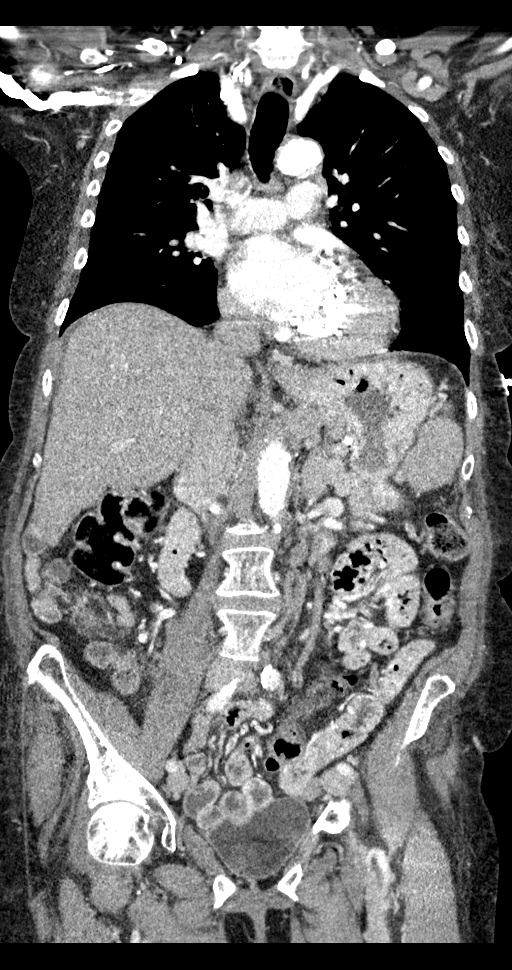
[im 58/116  bone]
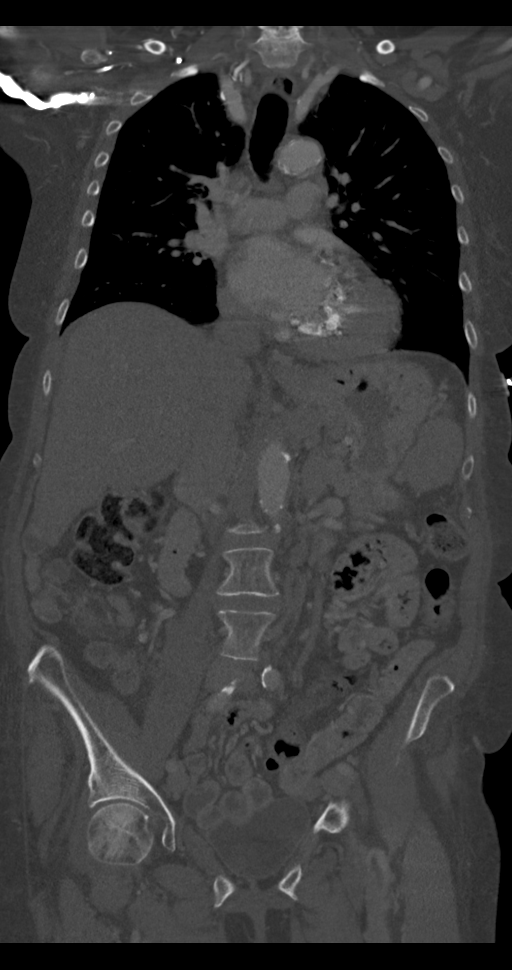

[11 of 36 positions shown; findings below may reference images not displayed]

FINDINGS: CTA CHEST FINDINGS

Cardiovascular: There is no cardiomegaly or pericardial effusion.
Multi vessel coronary vascular calcification as well as
calcification of the mitral annulus. There is mild atherosclerotic
calcification of the thoracic aorta. No aneurysmal dilatation or
dissection. The origins of the great vessels of the aortic arch are
patent. The central pulmonary arteries are unremarkable as
visualized.

Mediastinum/Nodes: No hilar or mediastinal adenopathy. Mildly
thickened distal esophagus likely related to cirrhosis. This
appearance is similar to prior CT. An infiltrative process or
neoplasm is less likely. The thyroid gland is grossly unremarkable.
No mediastinal fluid collection.

Lungs/Pleura: The lungs are clear. There is no pleural effusion or
pneumothorax. The central airways are patent.

Musculoskeletal: No chest wall abnormality. No acute or significant
osseous findings.

Review of the MIP images confirms the above findings.

CTA ABDOMEN AND PELVIS FINDINGS

VASCULAR

Aorta: There is advanced atherosclerotic calcification of the aorta.
There is a 2 cm infrarenal aortic ectasia. No aneurysm or
dissection.

Celiac: There is atherosclerotic calcification and narrowing of the
origin of the celiac axis. The celiac artery however remains patent.

SMA: Atherosclerotic calcification of the origin of the SMA. The SMA
is patent.

Renals: There is atherosclerotic calcification of the ostia of the
renal arteries. The renal arteries are patent. There is a duplicated
left renal artery.

IMA: Patent without evidence of aneurysm, dissection, vasculitis or
significant stenosis.

Inflow: Atherosclerotic calcification of the iliac vasculature. The
TI vessels however are patent.

Veins: The IVC appears unremarkable. The SMV, splenic vein, and
visualized portions of the main portal vein are patent. No portal
venous gas.

Review of the MIP images confirms the above findings.

NON-VASCULAR

No intra-abdominal free air.  Small perihepatic ascites.

Hepatobiliary: Cirrhosis. No biliary ductal dilatation. The
gallbladder is unremarkable.

Pancreas: Unremarkable. No pancreatic ductal dilatation or
surrounding inflammatory changes.

Spleen: Normal in size without focal abnormality.

Adrenals/Urinary Tract: The adrenal glands are unremarkable. There
is a 2 cm focal hypodense/nonenhancing area in the medial interpolar
aspect of the left kidney, likely sequela of prior infarct versus
less likely a cyst. Subcentimeter right renal hypodense lesion is
too small to characterize. There is no hydronephrosis on either
side. There is symmetric enhancement and excretion of contrast by
both kidneys. The urinary bladder is grossly unremarkable as
visualized.

Stomach/Bowel: There is no bowel obstruction or active inflammation.
Appendectomy.

Lymphatic: No adenopathy.

Reproductive: Hysterectomy.  No pelvic mass.

Other: Upper abdominal varices including esophageal, gastric, and
perisplenic varices.

Musculoskeletal: Osteopenia. Partially visualized left femoral
intramedullary rod and transcervical screw. Old left femoral neck
fracture which is incompletely healed. Old-appearing fracture of the
left pubic ramus at the symphysis pubis. Bilateral L5 pars defects
with grade 1 L5-S1 anterolisthesis. No acute osseous pathology.

Review of the MIP images confirms the above findings.
IMPRESSION: 1. No acute intrathoracic, abdominal, or pelvic pathology. No aortic
aneurysm or dissection. A 2 cm infrarenal aortic ectasia.
2. Cirrhosis with evidence of portal hypertension, ascites, and
upper abdominal varices.
3. Thickened appearance of the distal esophagus, similar to prior
CT, and likely related to cirrhosis and esophageal varices.
4.  Aortic Atherosclerosis ([id number].C).
5. Additional nonacute and nonemergent findings as described above.

## 2018-11-28 IMAGING — CT CT ANGIO CHEST-ABD-PELV FOR DISSECTION W/ AND WO/W CM
1 series · 13 of 31 positions shown, 17 images · IV contrast (iopamidol)
Comparison: CT of the abdomen pelvis dated 01/11/2018

CLINICAL DATA: 76-year-old female with slurred speech and
left-sided weakness. Evaluate for aortic dissection.

EXAM:
CT ANGIOGRAPHY CHEST, ABDOMEN AND PELVIS
TECHNIQUE: Multidetector CT imaging through the chest, abdomen and pelvis was
performed using the standard protocol during bolus administration of
intravenous contrast. Multiplanar reconstructed images and MIPs were
obtained and reviewed to evaluate the vascular anatomy.
CONTRAST:  175mL LFYMIW-FB5 IOPAMIDOL (LFYMIW-FB5) INJECTION 76%

[Series 2: axial pre · axial · non-contrast · 0.76mm/px · z∈[+1064,+1289]mm · 13 of 51 slices shown, 17 images]
[im 4/51  mediastinal]
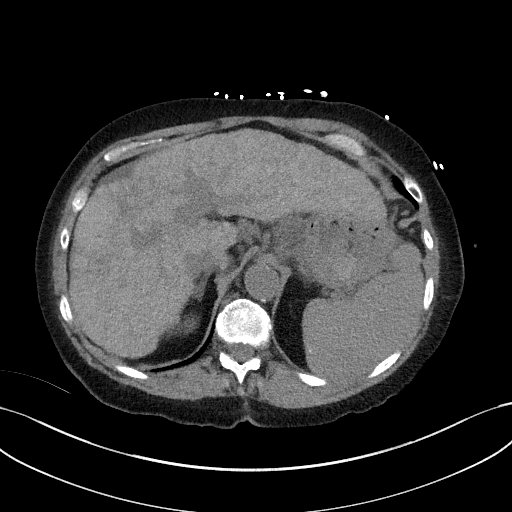
[im 4/51  bone]
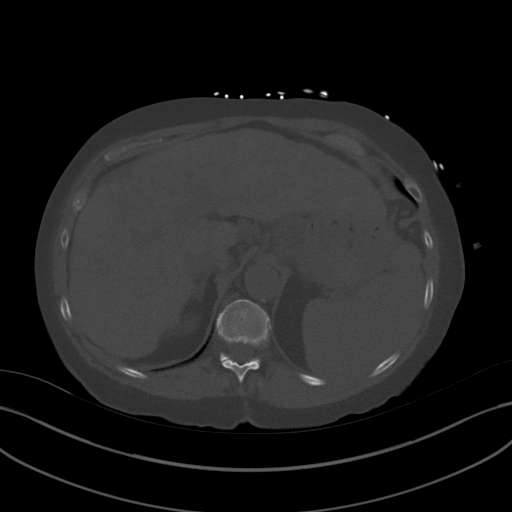
[im 8/51  mediastinal]
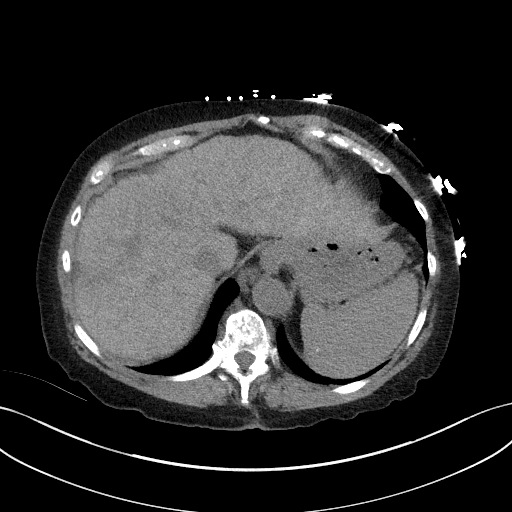
[im 12/51  mediastinal]
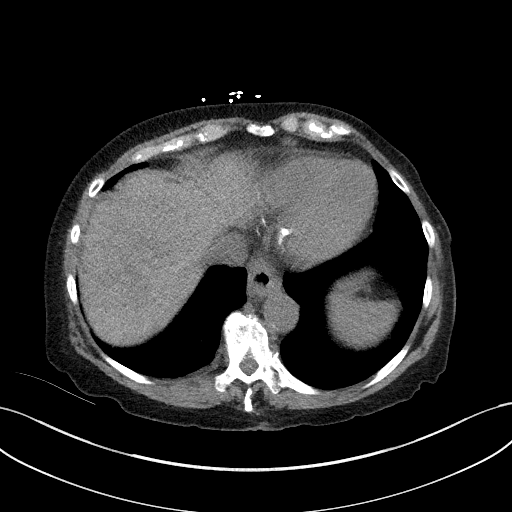
[im 17/51  mediastinal]
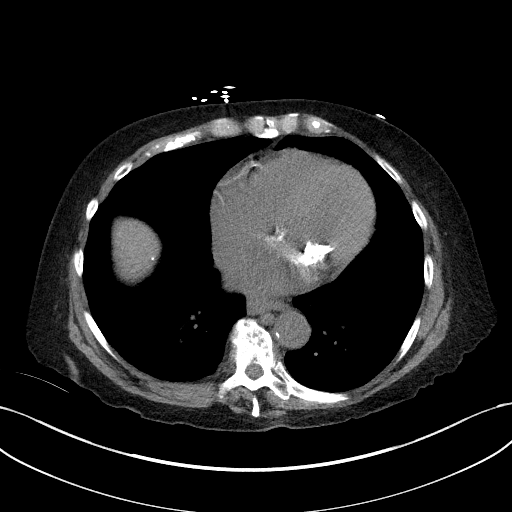
[im 21/51  mediastinal]
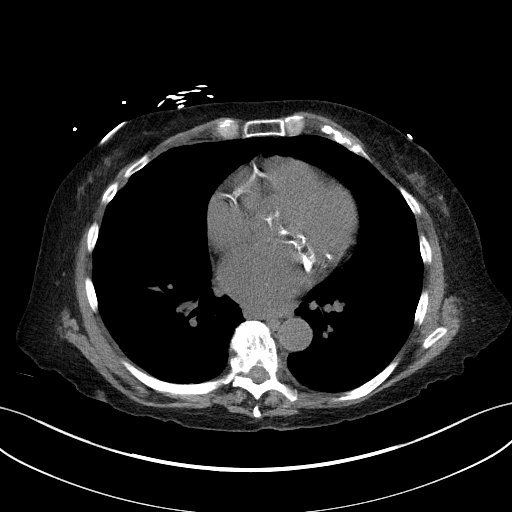
[im 25/51  mediastinal]
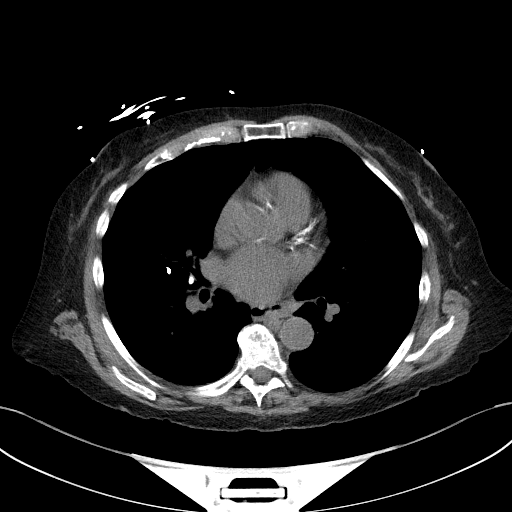
[im 30/51  mediastinal]
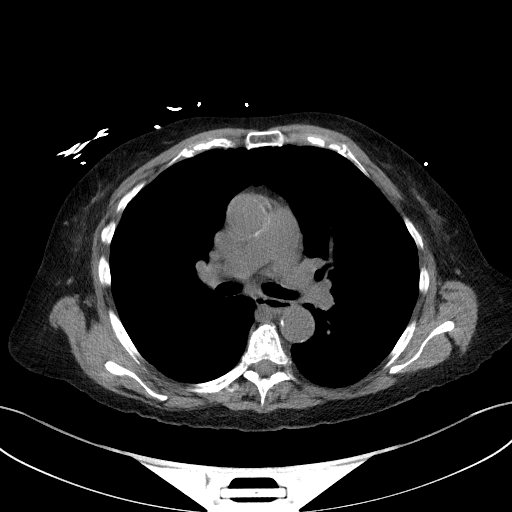
[im 32/51  mediastinal]
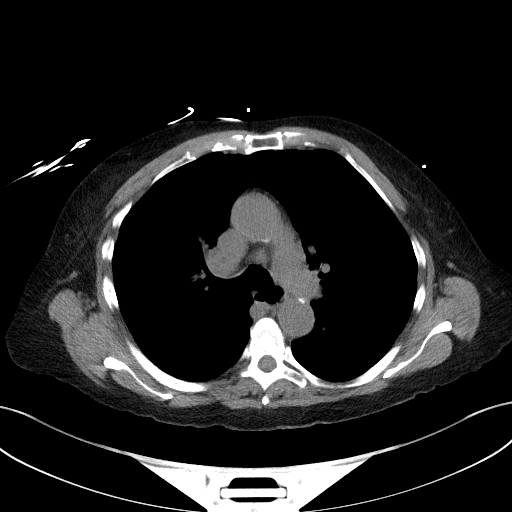
[im 38/51  mediastinal]
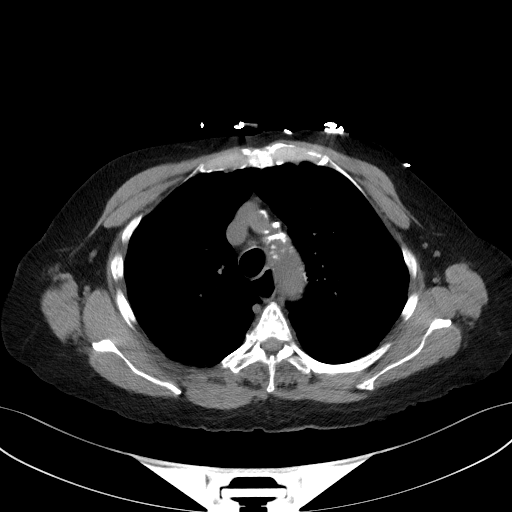
[im 38/51  bone]
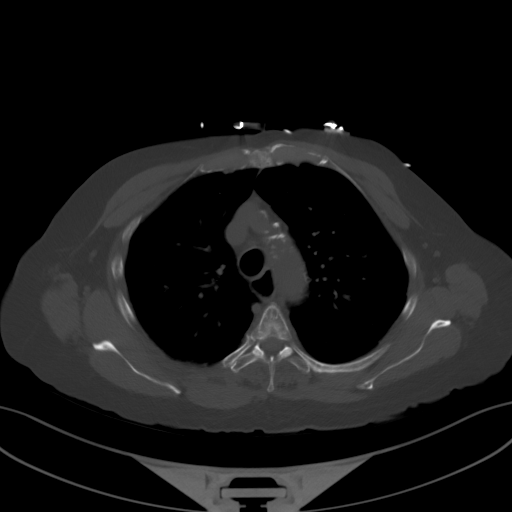
[im 43/51  mediastinal]
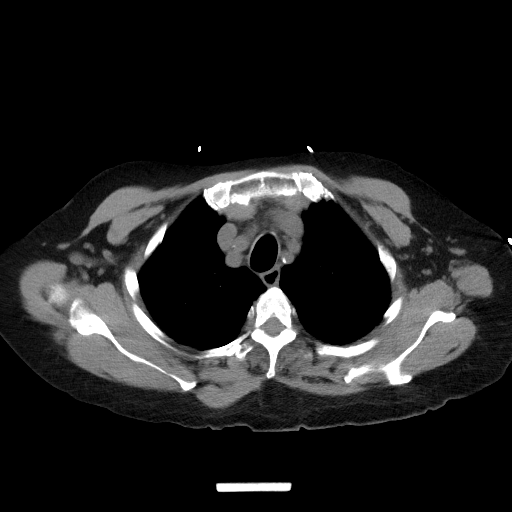
[im 43/51  lung]
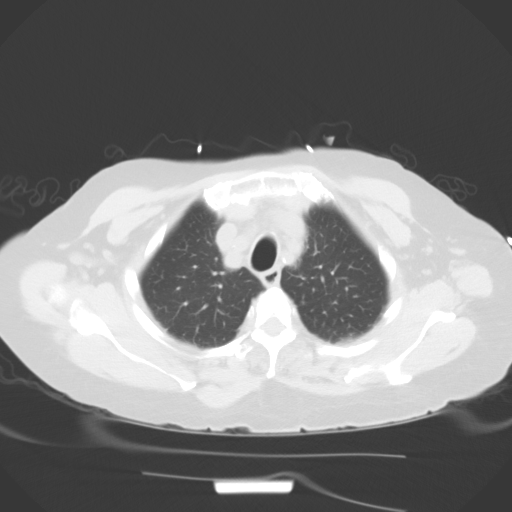
[im 45/51  lung]
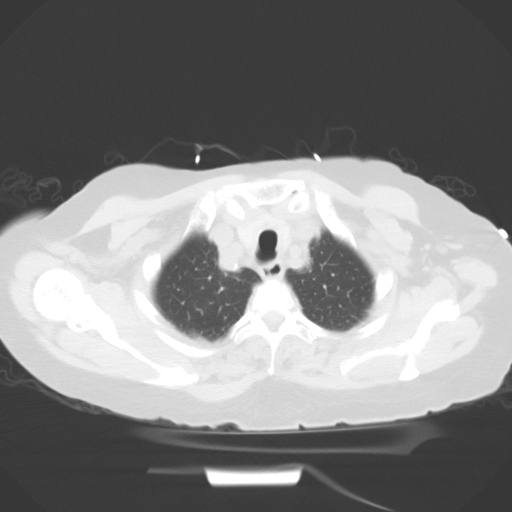
[im 47/51  mediastinal]
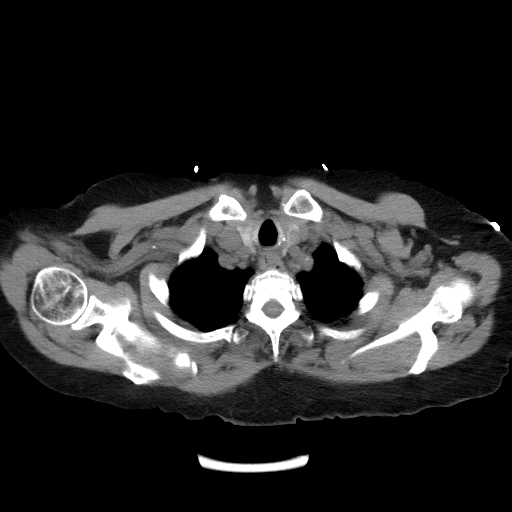
[im 47/51  lung]
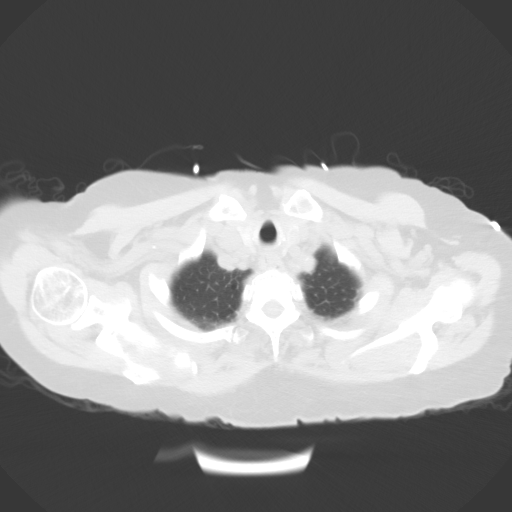
[im 49/51  lung]
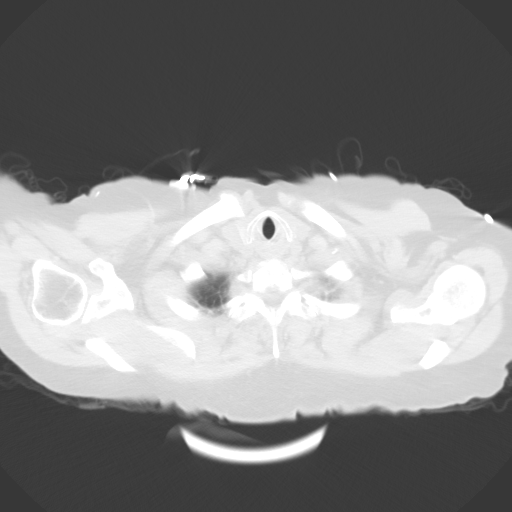

[13 of 31 positions shown; findings below may reference images not displayed]

FINDINGS: CTA CHEST FINDINGS

Cardiovascular: There is no cardiomegaly or pericardial effusion.
Multi vessel coronary vascular calcification as well as
calcification of the mitral annulus. There is mild atherosclerotic
calcification of the thoracic aorta. No aneurysmal dilatation or
dissection. The origins of the great vessels of the aortic arch are
patent. The central pulmonary arteries are unremarkable as
visualized.

Mediastinum/Nodes: No hilar or mediastinal adenopathy. Mildly
thickened distal esophagus likely related to cirrhosis. This
appearance is similar to prior CT. An infiltrative process or
neoplasm is less likely. The thyroid gland is grossly unremarkable.
No mediastinal fluid collection.

Lungs/Pleura: The lungs are clear. There is no pleural effusion or
pneumothorax. The central airways are patent.

Musculoskeletal: No chest wall abnormality. No acute or significant
osseous findings.

Review of the MIP images confirms the above findings.

CTA ABDOMEN AND PELVIS FINDINGS

VASCULAR

Aorta: There is advanced atherosclerotic calcification of the aorta.
There is a 2 cm infrarenal aortic ectasia. No aneurysm or
dissection.

Celiac: There is atherosclerotic calcification and narrowing of the
origin of the celiac axis. The celiac artery however remains patent.

SMA: Atherosclerotic calcification of the origin of the SMA. The SMA
is patent.

Renals: There is atherosclerotic calcification of the ostia of the
renal arteries. The renal arteries are patent. There is a duplicated
left renal artery.

IMA: Patent without evidence of aneurysm, dissection, vasculitis or
significant stenosis.

Inflow: Atherosclerotic calcification of the iliac vasculature. The
TI vessels however are patent.

Veins: The IVC appears unremarkable. The SMV, splenic vein, and
visualized portions of the main portal vein are patent. No portal
venous gas.

Review of the MIP images confirms the above findings.

NON-VASCULAR

No intra-abdominal free air.  Small perihepatic ascites.

Hepatobiliary: Cirrhosis. No biliary ductal dilatation. The
gallbladder is unremarkable.

Pancreas: Unremarkable. No pancreatic ductal dilatation or
surrounding inflammatory changes.

Spleen: Normal in size without focal abnormality.

Adrenals/Urinary Tract: The adrenal glands are unremarkable. There
is a 2 cm focal hypodense/nonenhancing area in the medial interpolar
aspect of the left kidney, likely sequela of prior infarct versus
less likely a cyst. Subcentimeter right renal hypodense lesion is
too small to characterize. There is no hydronephrosis on either
side. There is symmetric enhancement and excretion of contrast by
both kidneys. The urinary bladder is grossly unremarkable as
visualized.

Stomach/Bowel: There is no bowel obstruction or active inflammation.
Appendectomy.

Lymphatic: No adenopathy.

Reproductive: Hysterectomy.  No pelvic mass.

Other: Upper abdominal varices including esophageal, gastric, and
perisplenic varices.

Musculoskeletal: Osteopenia. Partially visualized left femoral
intramedullary rod and transcervical screw. Old left femoral neck
fracture which is incompletely healed. Old-appearing fracture of the
left pubic ramus at the symphysis pubis. Bilateral L5 pars defects
with grade 1 L5-S1 anterolisthesis. No acute osseous pathology.

Review of the MIP images confirms the above findings.
IMPRESSION: 1. No acute intrathoracic, abdominal, or pelvic pathology. No aortic
aneurysm or dissection. A 2 cm infrarenal aortic ectasia.
2. Cirrhosis with evidence of portal hypertension, ascites, and
upper abdominal varices.
3. Thickened appearance of the distal esophagus, similar to prior
CT, and likely related to cirrhosis and esophageal varices.
4.  Aortic Atherosclerosis ([id number].C).
5. Additional nonacute and nonemergent findings as described above.

## 2018-12-01 ENCOUNTER — Ambulatory Visit (HOSPITAL_COMMUNITY): Payer: Medicare Other

## 2018-12-01 ENCOUNTER — Ambulatory Visit (HOSPITAL_COMMUNITY): Payer: Medicare Other | Admitting: Occupational Therapy

## 2018-12-03 ENCOUNTER — Ambulatory Visit (HOSPITAL_COMMUNITY): Payer: Medicare Other | Admitting: Physical Therapy

## 2018-12-06 ENCOUNTER — Encounter (HOSPITAL_COMMUNITY): Payer: Self-pay

## 2018-12-08 ENCOUNTER — Encounter (HOSPITAL_COMMUNITY): Payer: Self-pay | Admitting: Occupational Therapy

## 2018-12-08 ENCOUNTER — Encounter (HOSPITAL_COMMUNITY): Payer: Self-pay

## 2018-12-10 ENCOUNTER — Encounter (HOSPITAL_COMMUNITY): Payer: Self-pay | Admitting: Physical Therapy

## 2018-12-10 ENCOUNTER — Encounter (HOSPITAL_COMMUNITY): Payer: Self-pay | Admitting: Occupational Therapy

## 2018-12-10 IMAGING — CT CT HEAD CODE STROKE
3 series · 15 of 47 positions shown, 18 images · non-contrast
Comparison: MRI head 05/07/2018, CT 05/07/2018

CLINICAL DATA: Code stroke. Speech difficulty. Slurred speech left
facial droop

EXAM:
CT HEAD WITHOUT CONTRAST
TECHNIQUE: Contiguous axial images were obtained from the base of the skull
through the vertex without intravenous contrast.

[Series 2: head code stroke wo · axial · 0.42mm/px · z∈[+8,+133]mm · 9 of 30 slices shown, 12 images]
[im 3/30  brain]
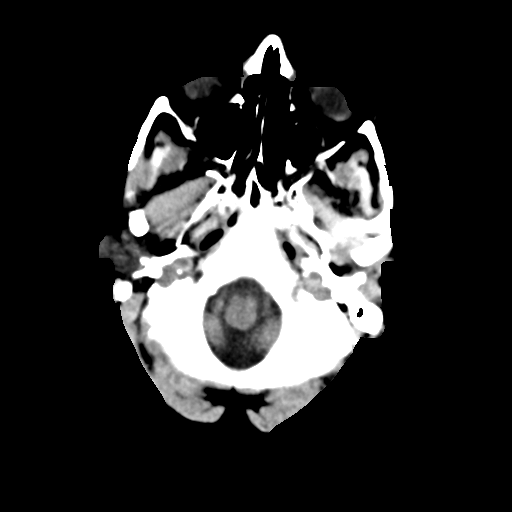
[im 3/30  bone]
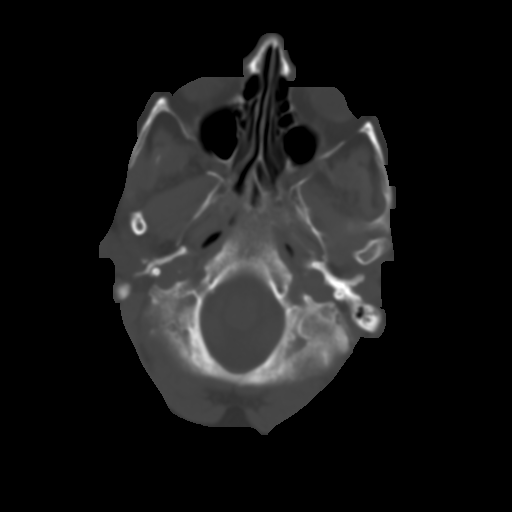
[im 6/30  brain]
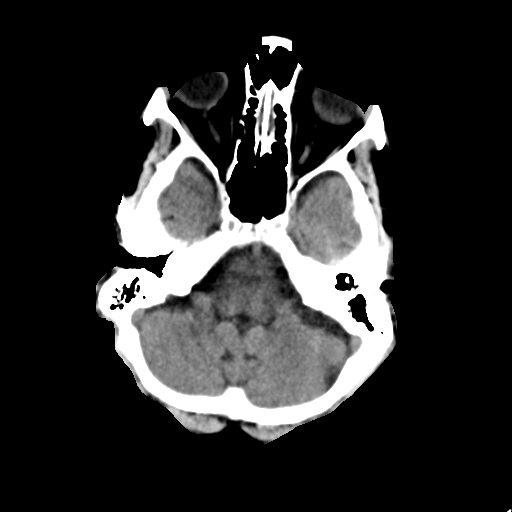
[im 9/30  brain]
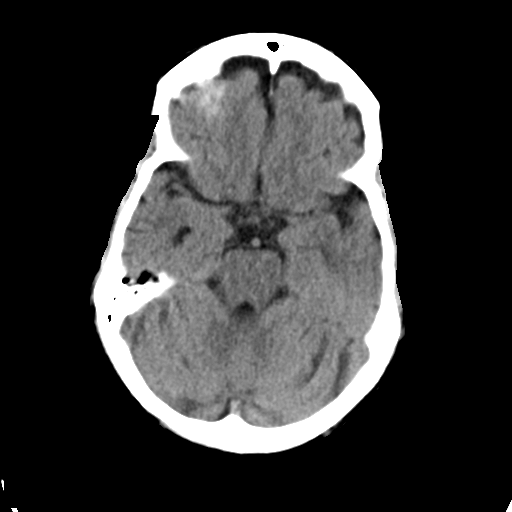
[im 12/30  brain]
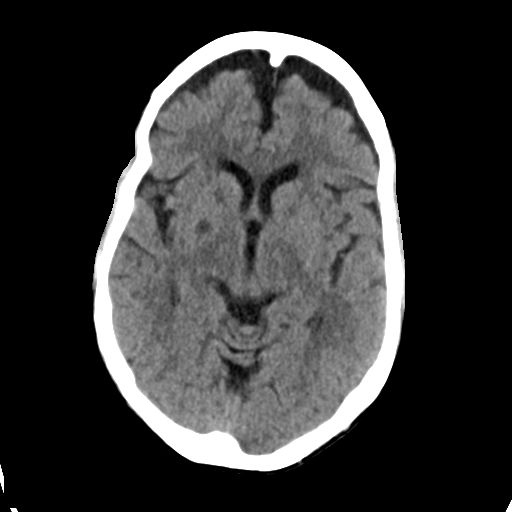
[im 16/30  brain]
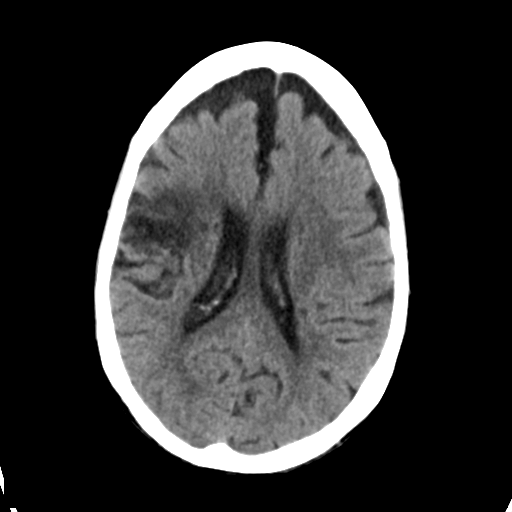
[im 16/30  bone]
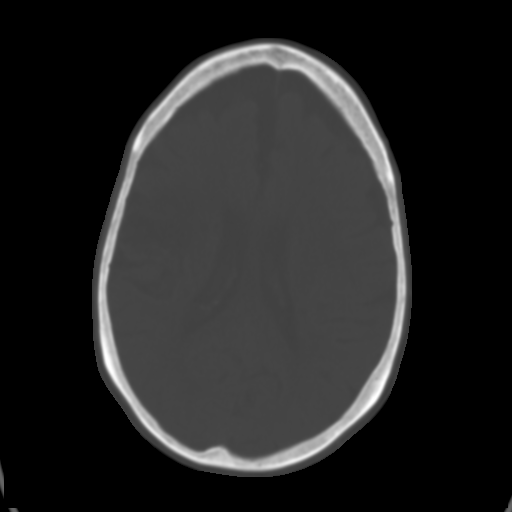
[im 19/30  brain]
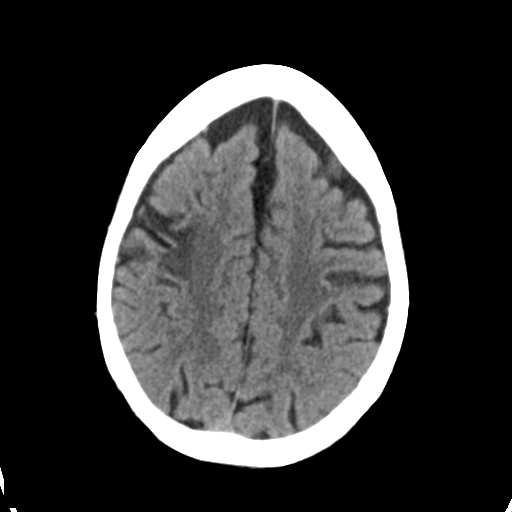
[im 22/30  brain]
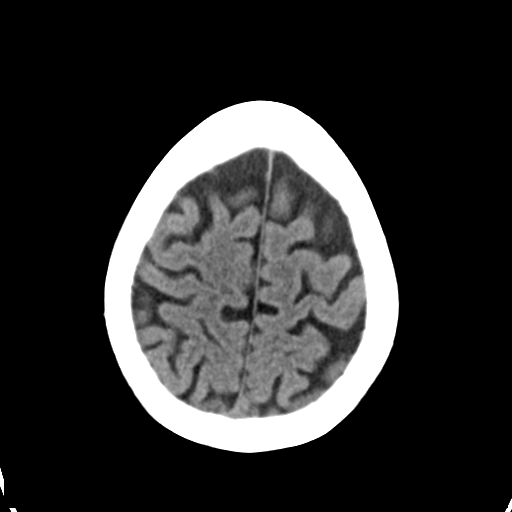
[im 25/30  brain]
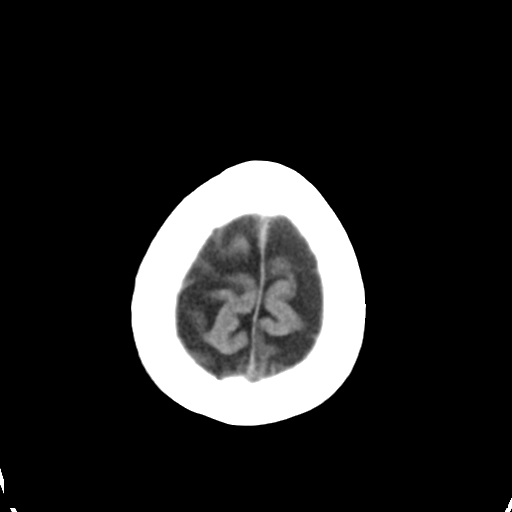
[im 28/30  brain]
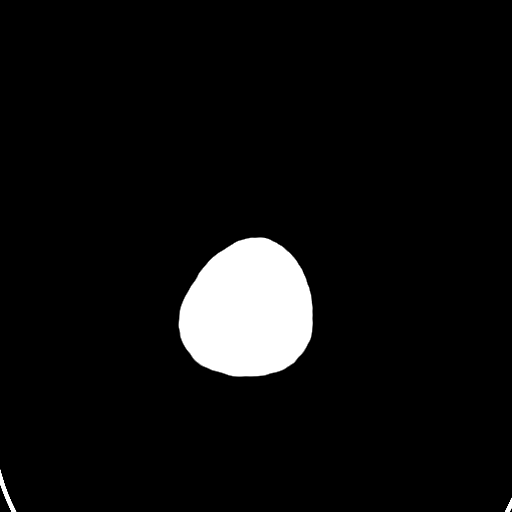
[im 28/30  bone]
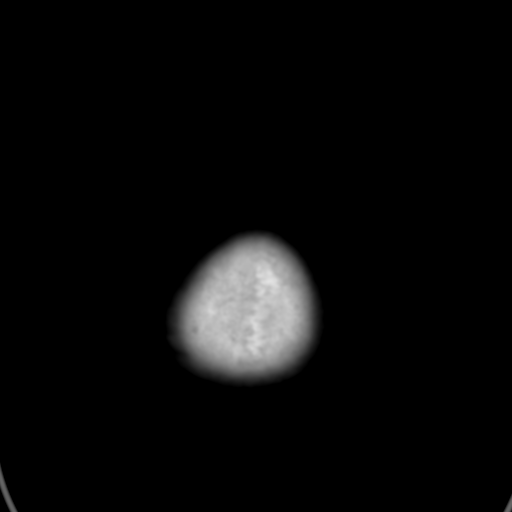

[Series 4: coronal soft tissue · coronal · 0.27mm/px · 3 of 66 slices shown]
[im 22/66  brain]
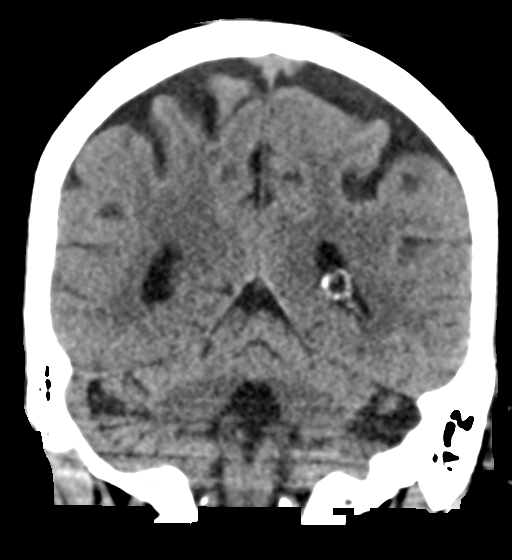
[im 29/66  brain]
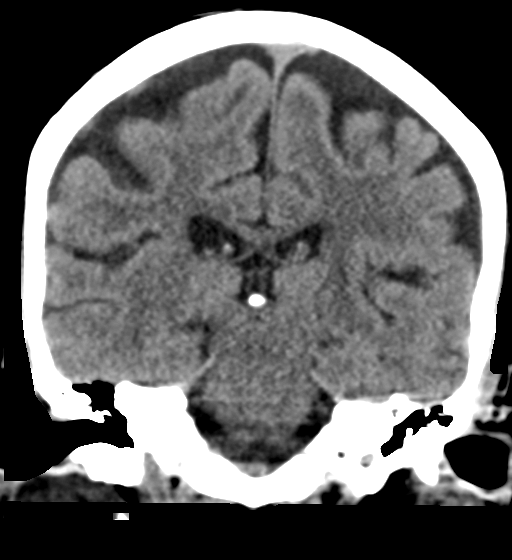
[im 37/66  brain]
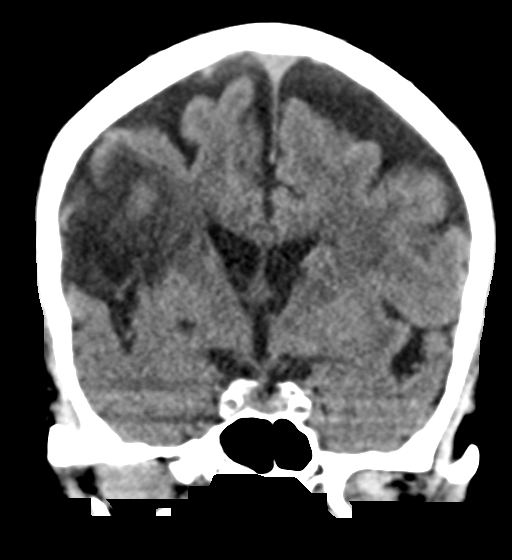

[Series 5: sagittal soft tissue · sagittal · 0.31mm/px · 3 of 47 slices shown]
[im 16/47  brain]
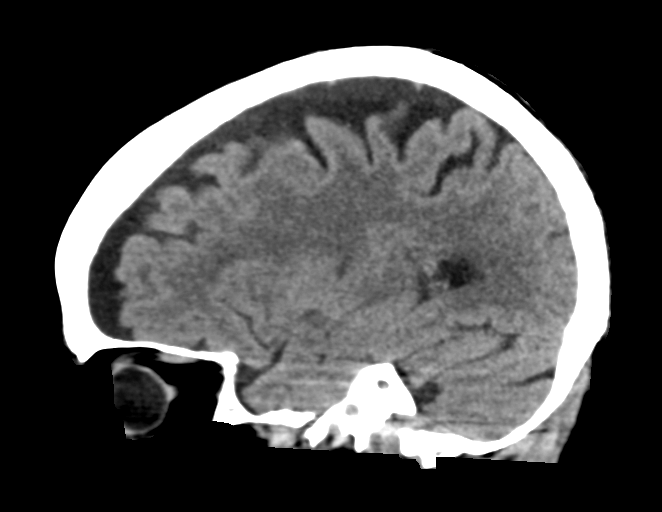
[im 24/47  brain]
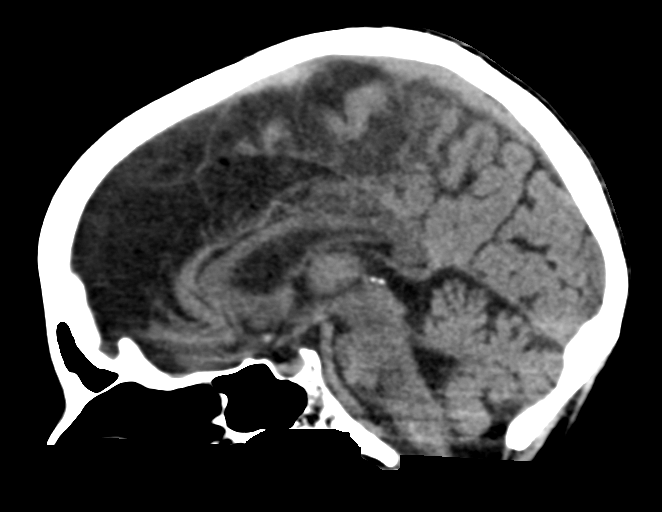
[im 31/47  brain]
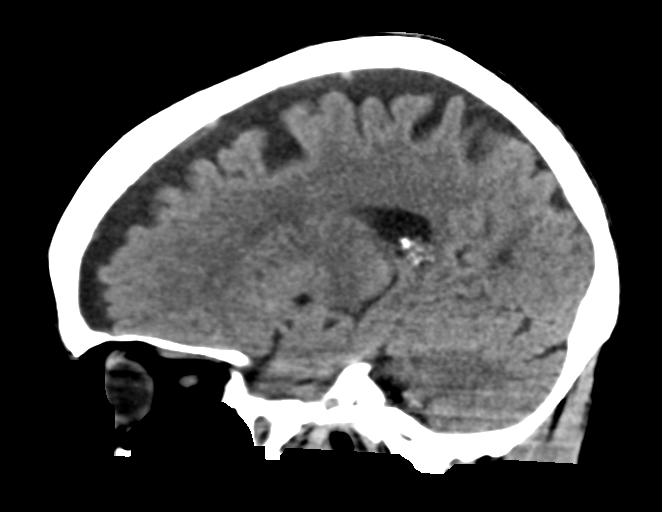

[15 of 47 positions shown; findings below may reference images not displayed]

FINDINGS: Brain: Negative for acute infarct. Negative for acute hemorrhage or
mass

Chronic infarct right frontal operculum. Chronic ischemia internal
capsule bilaterally. Benign-appearing cyst right putamen. Small
chronic infarcts right cerebellum. Ventricle size normal.

Vascular: Negative for hyperdense vessel

Skull: Negative

Sinuses/Orbits: Paranasal sinuses clear.  Bilateral cataract surgery

Other: None

ASPECTS (Alberta Stroke Program Early CT Score)

- Ganglionic level infarction (caudate, lentiform nuclei, internal
capsule, insula, M1-M3 cortex): 7

- Supraganglionic infarction (M4-M6 cortex): 3

Total score (0-10 with 10 being normal): 10
IMPRESSION: 1. No acute abnormality
2. Chronic ischemic changes stable from the prior study.
3. ASPECTS is 10
4. These results were called by telephone at the time of
interpretation on 05/19/2018 at [DATE] to Dr. CEDRIC BARTOLON , who
verbally acknowledged these results.

## 2018-12-10 IMAGING — MR MR HEAD W/O CM
6 of 10 series · 29 of 48 positions shown · non-contrast
Comparison: CT same day.  MRI 05/07/2018.

CLINICAL DATA: Recent acute lacunar stroke 04/27/2018. Left facial
droop and dysarthria.

EXAM:
MRI HEAD WITHOUT CONTRAST
TECHNIQUE: Multiplanar, multiecho pulse sequences of the brain and surrounding
structures were obtained without intravenous contrast.

[Series 3: DWI · axial · 3.0mm · 0.72mm/px · z∈[-119,+43]mm · 6 of 55 slices shown (1 of 4)]
[im 1/55]
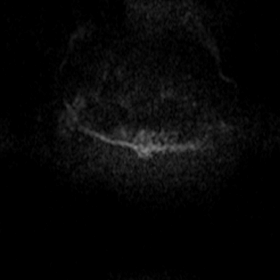
[im 11/55]
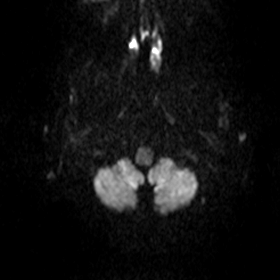
[im 22/55]
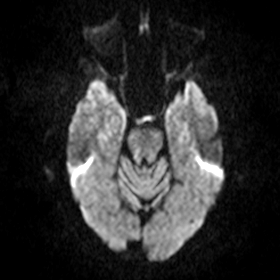
[im 33/55]
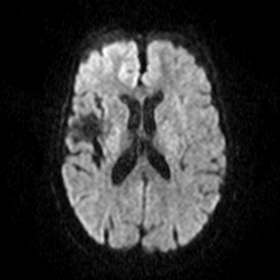
[im 44/55]
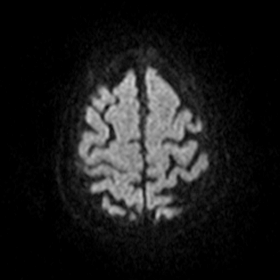
[im 55/55]
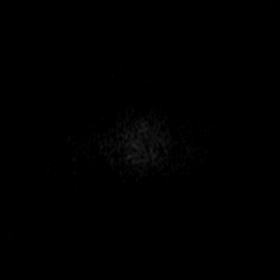

[Series 4: DWI · axial · 3.0mm · 0.72mm/px · z∈[-119,+43]mm · 6 of 55 slices shown (2 of 4)]
[im 1/55]
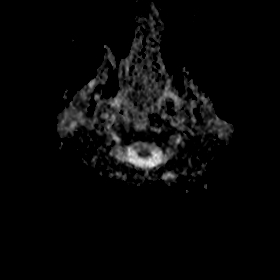
[im 11/55]
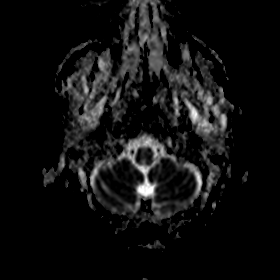
[im 22/55]
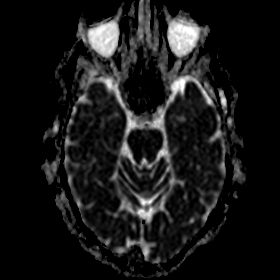
[im 33/55]
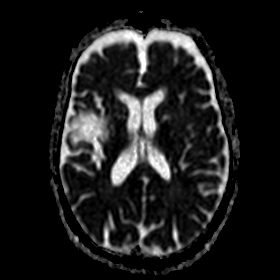
[im 44/55]
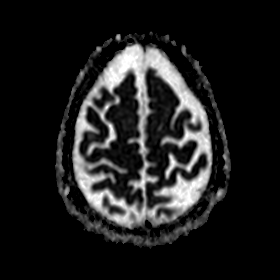
[im 55/55]
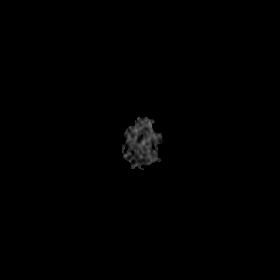

[Series 5: DWI · coronal · 5.0mm · 0.46mm/px · 4 of 38 slices shown (3 of 4)]
[im 1/38]
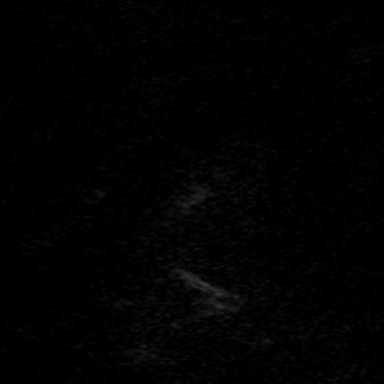
[im 13/38]
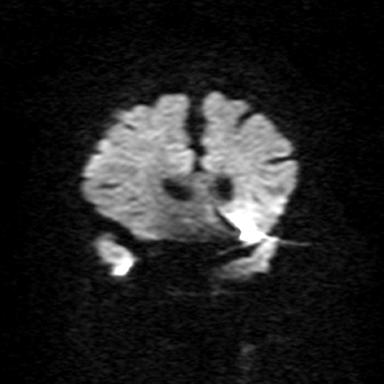
[im 25/38]
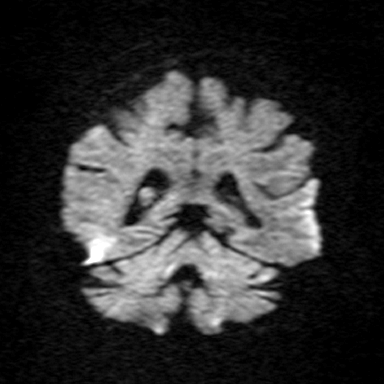
[im 38/38]
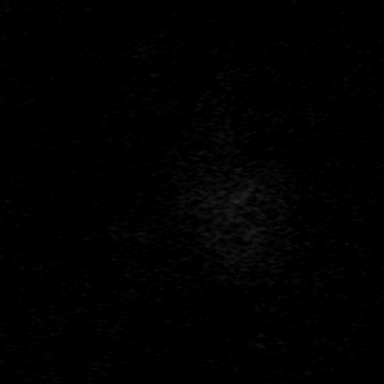

[Series 6: DWI · coronal · 5.0mm · 0.46mm/px · 5 of 38 slices shown (4 of 4)]
[im 1/38]
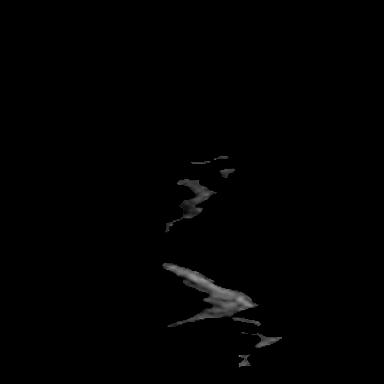
[im 10/38]
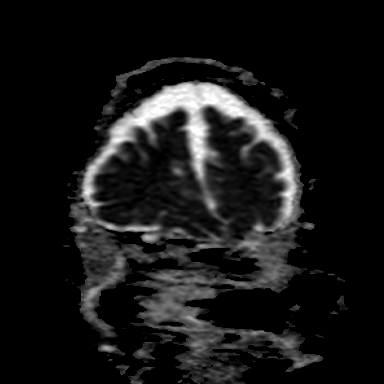
[im 19/38]
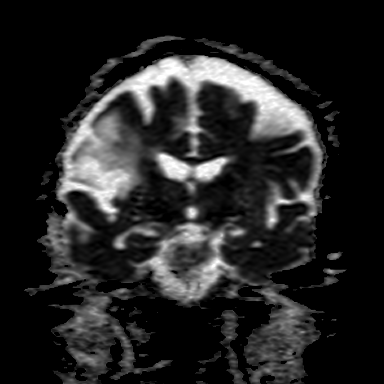
[im 28/38]
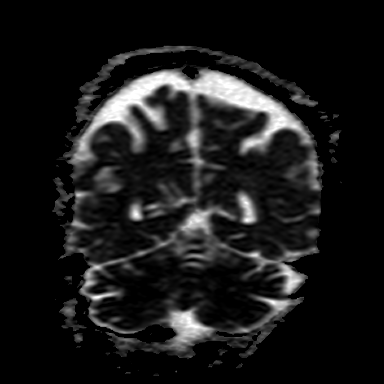
[im 38/38]
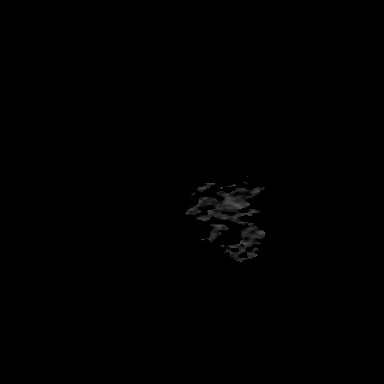

[Series 8: T2 · axial · 5.0mm · 0.48mm/px · z∈[-110,-39]mm · 2 of 23 slices shown]
[im 1/23]
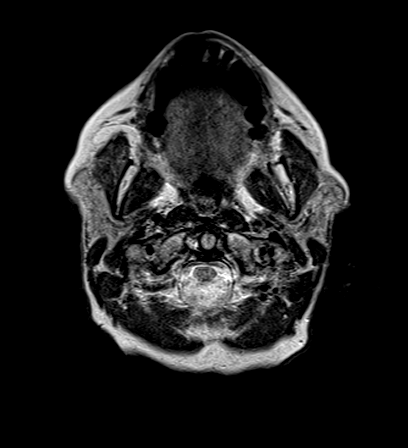
[im 12/23]
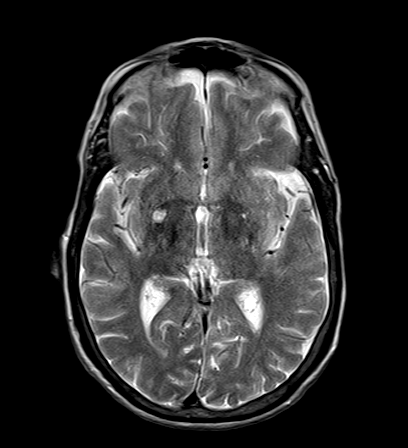

[Series 9: FLAIR · axial · 3.0mm · 0.33mm/px · z∈[-115,+38]mm · 6 of 52 slices shown]
[im 1/52]
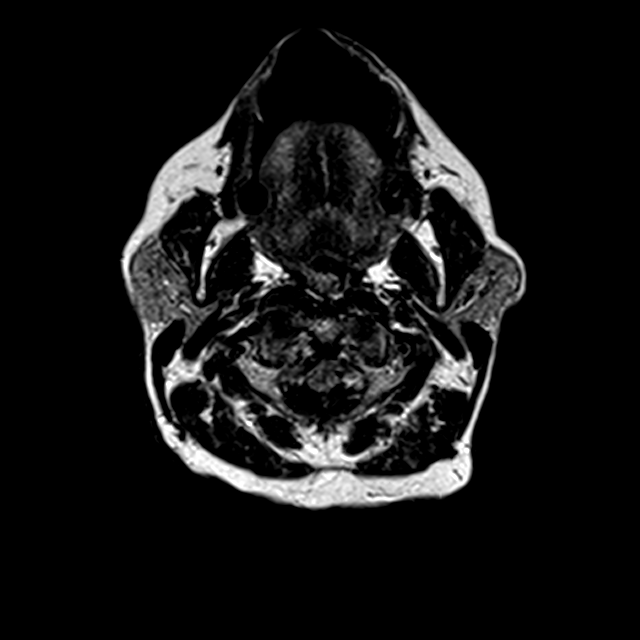
[im 11/52]
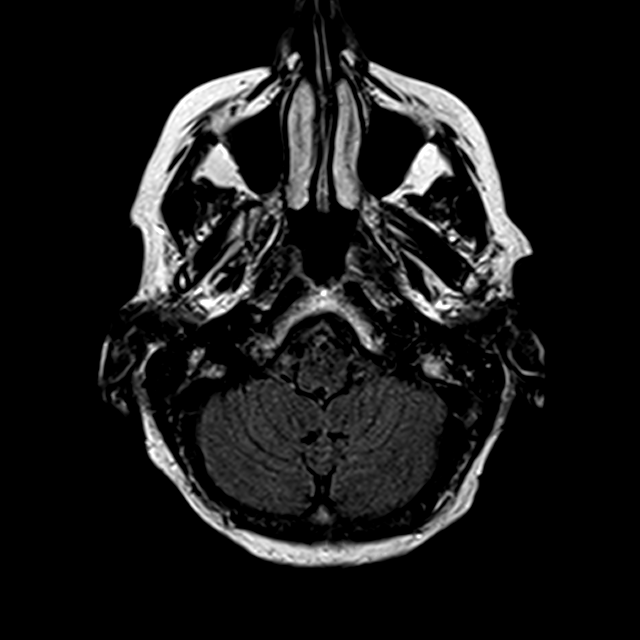
[im 21/52]
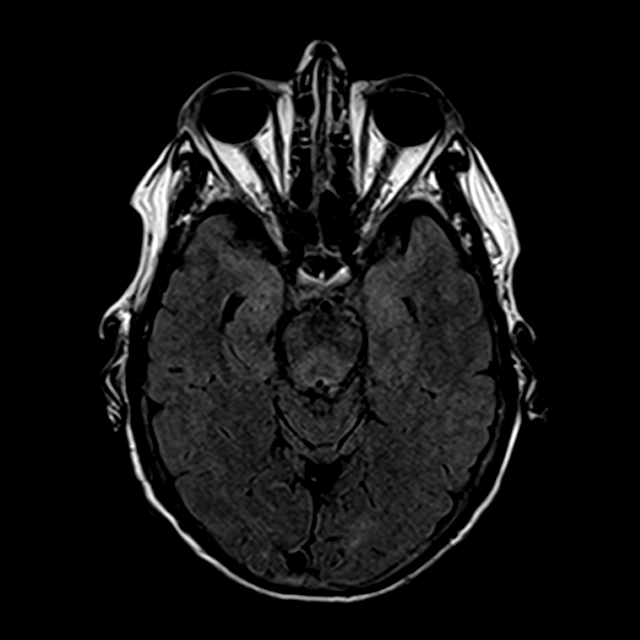
[im 31/52]
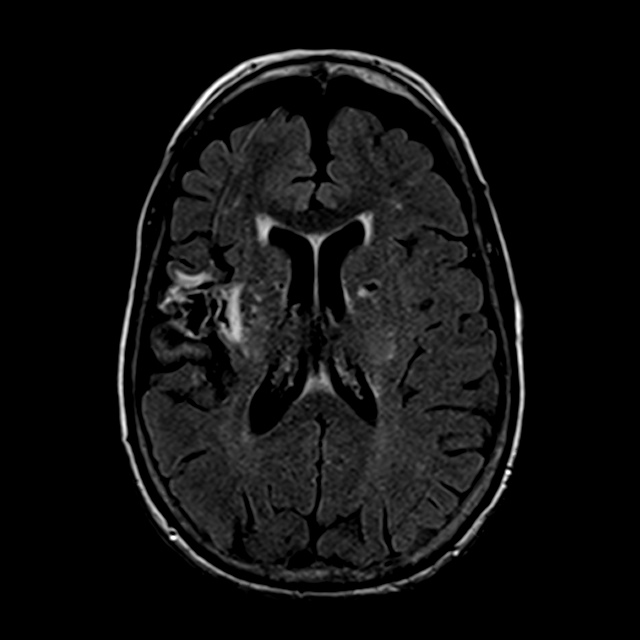
[im 41/52]
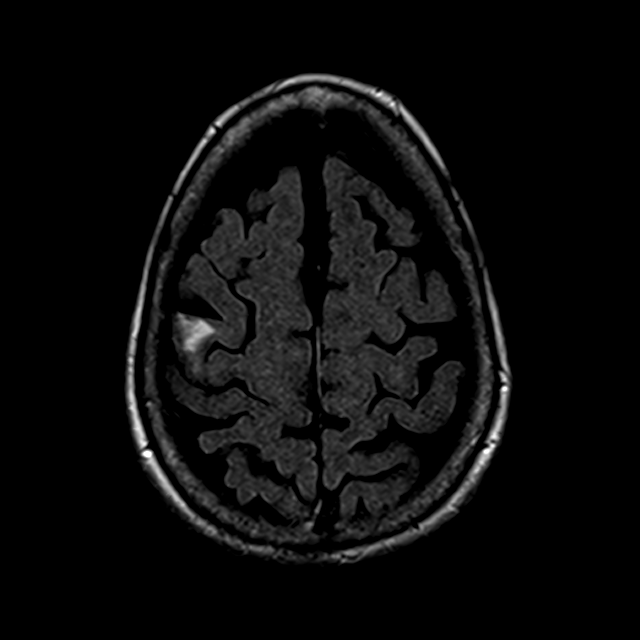
[im 52/52]
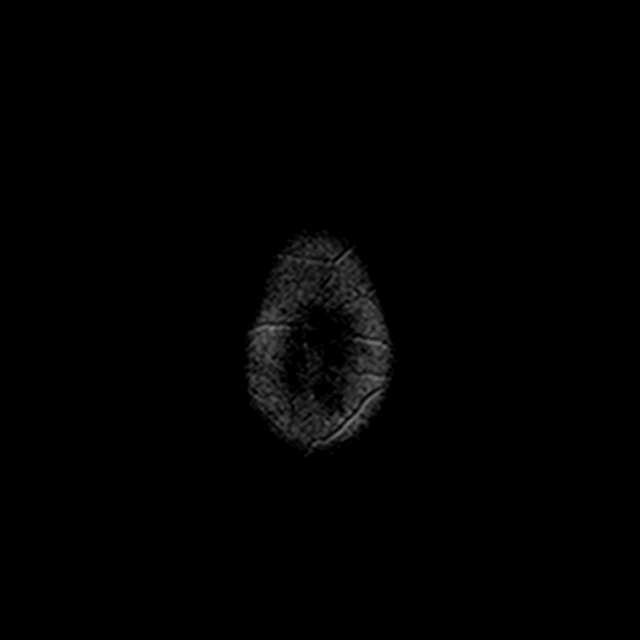

[29 of 48 positions shown; findings below may reference images not displayed]

FINDINGS: Brain: Subcentimeter acute/subacute infarction of the
cortical/subcortical brain at the left frontoparietal vertex is
again demonstrated. This appears similar on diffusion imaging but is
more apparent on T2 and FLAIR imaging. No other recent insult is
seen. Elsewhere, there chronic small-vessel ischemic changes of the
pons and cerebellum. There is an old lacunar infarction or dilated
perivascular space at the base of the brain on the right. There are
old small vessel basal ganglia and thalamic infarctions. There is
old infarction in the right insula and posterior frontal lobe no
sign of mass lesion, hemorrhage, hydrocephalus or extra-axial
collection.

Vascular: Major vessels at the base of the brain show flow.

Skull and upper cervical spine: Negative

Sinuses/Orbits: Clear/normal

Other: None
IMPRESSION: No new finding since the previous study. The subcentimeter
cortical/subcortical infarction at the left frontoparietal vertex is
redemonstrated, more conspicuous on the parenchymal imaging today
based on maturation of the infarction.

Old ischemic changes as seen previously including an old right MCA
branch vessel infarction affecting the insula and posterior frontal
cortical and subcortical brain and numerous small vessel infarctions
affecting the pons, cerebellum, thalami, basal ganglia and
hemispheric white matter.

## 2018-12-22 ENCOUNTER — Telehealth (HOSPITAL_COMMUNITY): Payer: Self-pay

## 2018-12-22 NOTE — Telephone Encounter (Signed)
I spoke with [name] to check in with how she is doing since our office closure and asked if she would like an update on exercises and to be called to return when we re-open. She feels that she is doing well and will not need to return to therapy. She would like some additional exercises but feels she is ready to be discharged.  Kipp Brood, PT, DPT, [facility] Physical Therapist with [facility]  12/22/2018 3:43 PM

## 2018-12-27 ENCOUNTER — Encounter (HOSPITAL_COMMUNITY): Payer: Self-pay

## 2018-12-27 DIAGNOSIS — M25559 Pain in unspecified hip: Secondary | ICD-10-CM | POA: Diagnosis not present

## 2018-12-27 DIAGNOSIS — G4700 Insomnia, unspecified: Secondary | ICD-10-CM | POA: Diagnosis not present

## 2018-12-27 DIAGNOSIS — E119 Type 2 diabetes mellitus without complications: Secondary | ICD-10-CM | POA: Diagnosis not present

## 2018-12-29 ENCOUNTER — Encounter (HOSPITAL_COMMUNITY): Payer: Self-pay

## 2018-12-29 NOTE — Therapy (Signed)
[facility] [address] [place], [place], [address] Phone: [phone]   Fax:  [phone]  Patient Details  Name: [name] MRN: [id number] Date of Birth: [birth year] Referring Provider:  No ref. provider found  Encounter Date: 12/29/2018   PHYSICAL THERAPY DISCHARGE SUMMARY  Visits from Start of Care: 17  Current functional level related to goals / functional outcomes: Patient participated in last treatment session on 11/17/18 prior to our office closure related to COVID-19 spread. Physical therapists have been in contact with her since office closure and she has reported feeling she is doing well and no longer needs to come in for therapy. She requested an updated HEP on 12/22/18 and it was emailed to her on 12/27/18. She will be discharged from therapy.    Remaining deficits: See last treatment note and progress note. Below goal status at last session.    PT Short Term Goals - 11/05/18 1255            PT SHORT TERM GOAL #1   Title  Patient will demonstrate understanding and report regular compliance with HEP to improve strength, balance and functional mobility.     Time  3    Period  Weeks    Status  Achieved        PT SHORT TERM GOAL #2   Title  Patient will report ability to ambulate for at least 20 minutes in order to go to the grocery store and pick out food without frequent rest breaks.    Time  3    Period  Weeks    Status  Achieved           PT Long Term Goals - 11/05/18 1502            PT LONG TERM GOAL #1   Title  Patient will demonstrate improvement of at least 1/2 MMT strength grade in all deficient hip and knee muscles in order to improve stability and balance and decrease patient's risk of falls.     Baseline  10/27/18: Patient achieved in some, but not all muscle groups.     Time  6    Period  Weeks    Status  On-going        PT LONG TERM GOAL #2   Title  Patient will demonstrate  improvement of at least 10 seconds on the TUG indicating improved functional mobility and balance.     Baseline  10/27/18: Patient improved time on the TUG from 90 seconds to 73 seconds at this re-assessment.     Time  6    Period  Weeks    Status  Achieved        PT LONG TERM GOAL #3   Title  Patient will demonstrate improvement of at least 7 points on the [facility] Balance Scale indicating improved balance, safety, and decreased risk of falls.     Baseline  10/27/18: Patient improved by a couple points, but not by 7 points.     Time  6    Period  Weeks    Status  TEFL teacher / Equipment: Patient has been educated to obtain new referral if she wishes to return to physical therapy for any change in functional status. She was provided HEP throughout and emailed and updated HEP on 12/27/18 after discussing her choice to be discharge.  Plan: Patient agrees to discharge.  Patient goals were partially met. Patient is being discharged due  to being pleased with the current functional level.  ?????     Kipp Brood, PT, DPT, [facility] Physical Therapist with [facility]  12/29/2018 8:37 AM    [place], [place], [address] Phone: [phone]   Fax:  [phone]

## 2019-01-04 ENCOUNTER — Encounter (HOSPITAL_COMMUNITY): Payer: Self-pay | Admitting: Occupational Therapy

## 2019-01-04 NOTE — Therapy (Signed)
[facility] [address] [place], [place], [address] Phone: [phone]   Fax:  [phone]  Patient Details  Name: [name] MRN: [id number] Date of Birth: 04-05-1941 Referring Provider:  No ref. provider found   OCCUPATIONAL THERAPY DISCHARGE SUMMARY  Visits from Start of Care: 5  Current functional level related to goals / functional outcomes: Unknown. Pt has not been seen in clinic since 11/10/2018, prior to COVID closure. OT has not been able to reach pt via telephone, however PT notes indicate pt would like to be discharged from rehab services. Pt will need a new referral to return to therapy services.    Remaining deficits: unknown   Education / Equipment: HEP for wrist ROM and strengthening Plan: Patient agrees to discharge.  Patient goals were not met. Patient is being discharged due to being pleased with the current functional level.  ?????  And COVID clinic closure     Encounter Date: 01/04/2019  Guadelupe Sabin, OTR/L  [phone] 01/04/2019, 2:17 PM  [place] [address] [place], [place], [address] Phone: [phone]   Fax:  [phone]

## 2019-01-21 DIAGNOSIS — M25531 Pain in right wrist: Secondary | ICD-10-CM | POA: Diagnosis not present

## 2019-01-21 DIAGNOSIS — E1140 Type 2 diabetes mellitus with diabetic neuropathy, unspecified: Secondary | ICD-10-CM | POA: Diagnosis not present

## 2019-01-27 IMAGING — CT CT HEAD CODE STROKE
3 series · 15 of 47 positions shown, 18 images · non-contrast
Comparison: CT head without contrast 05/19/2018. MRI brain
05/19/2018.

CLINICAL DATA: Code stroke. Acute onset of left-sided weakness
today. Dizziness.

EXAM:
CT HEAD WITHOUT CONTRAST
TECHNIQUE: Contiguous axial images were obtained from the base of the skull
through the vertex without intravenous contrast.

[Series 2: head code stroke wo · axial · 0.43mm/px · z∈[+69,+199]mm · 9 of 32 slices shown, 12 images]
[im 3/32  brain]
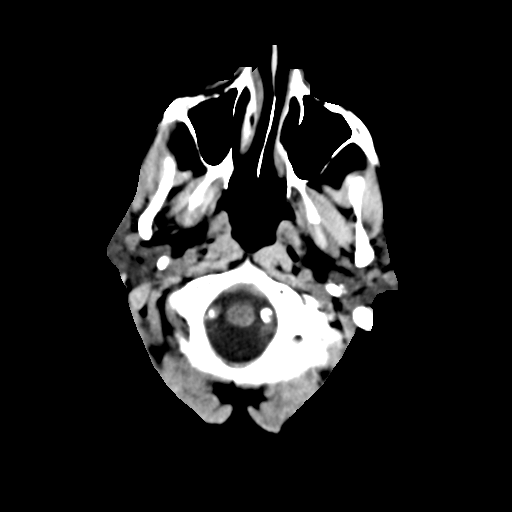
[im 3/32  bone]
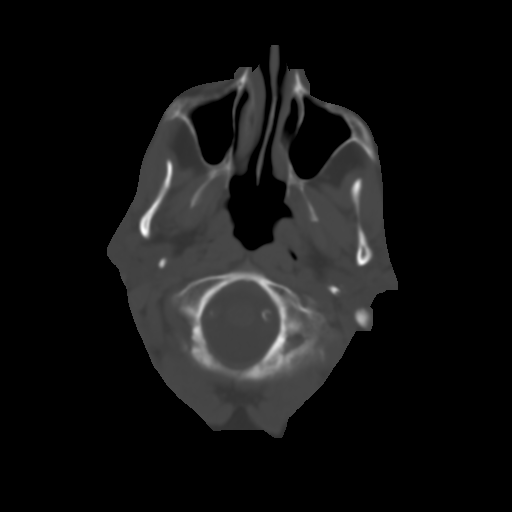
[im 6/32  brain]
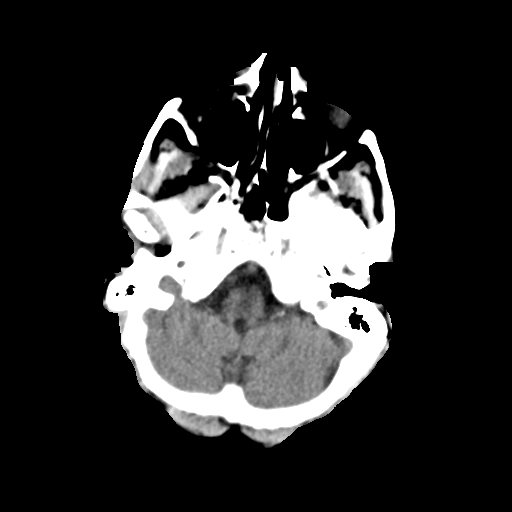
[im 9/32  brain]
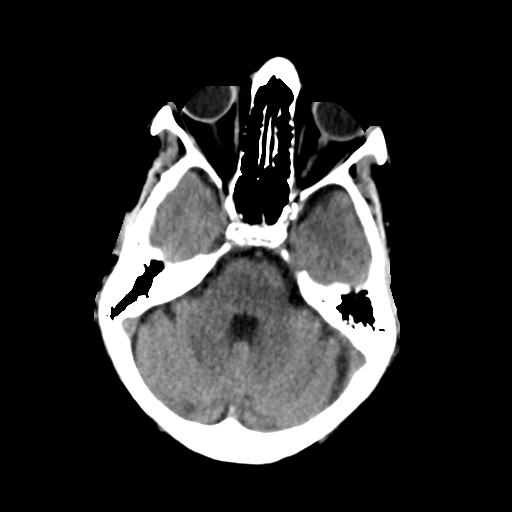
[im 12/32  brain]
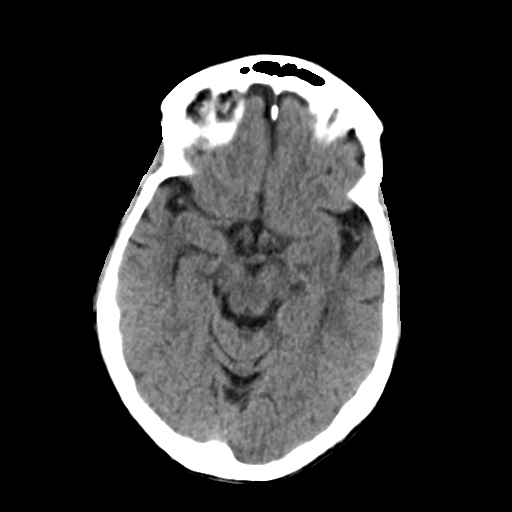
[im 17/32  brain]
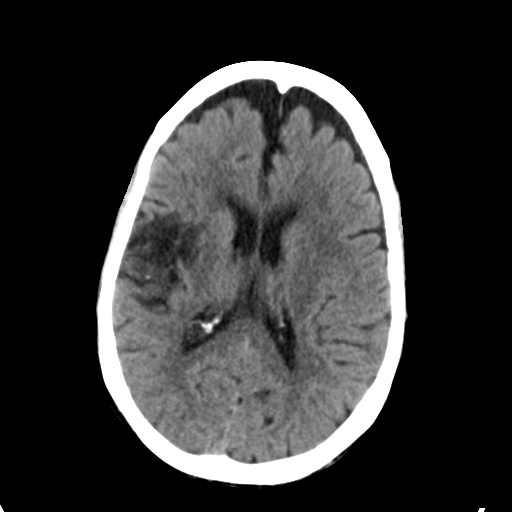
[im 17/32  bone]
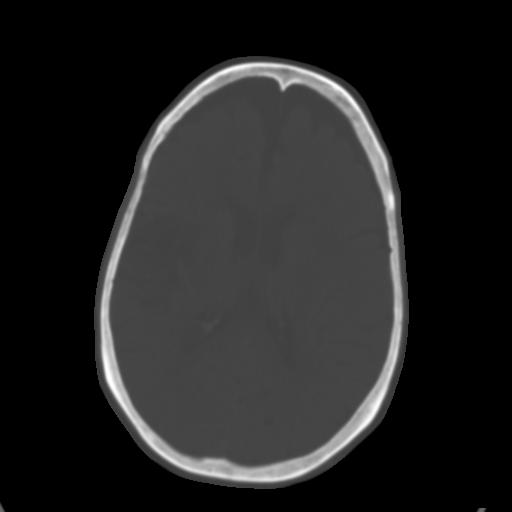
[im 20/32  brain]
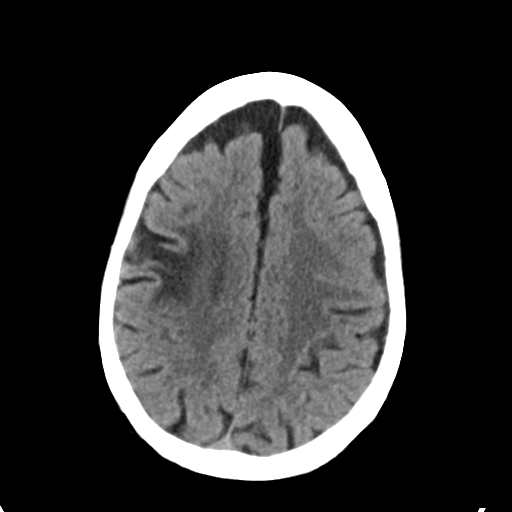
[im 23/32  brain]
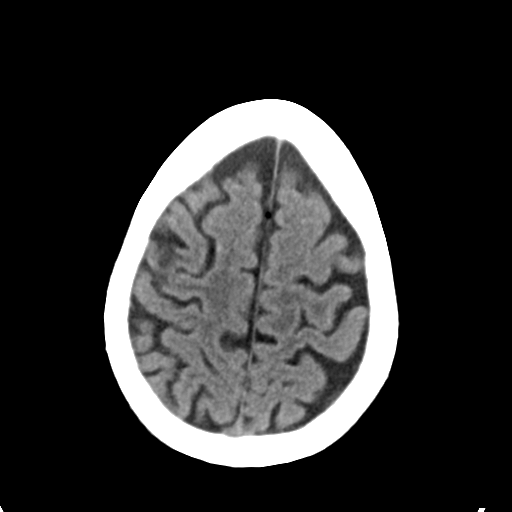
[im 26/32  brain]
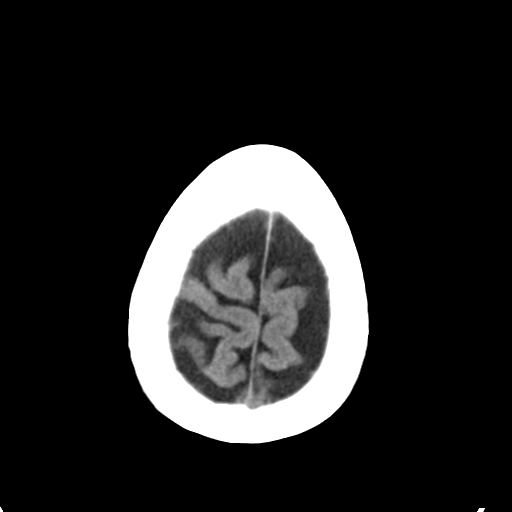
[im 29/32  brain]
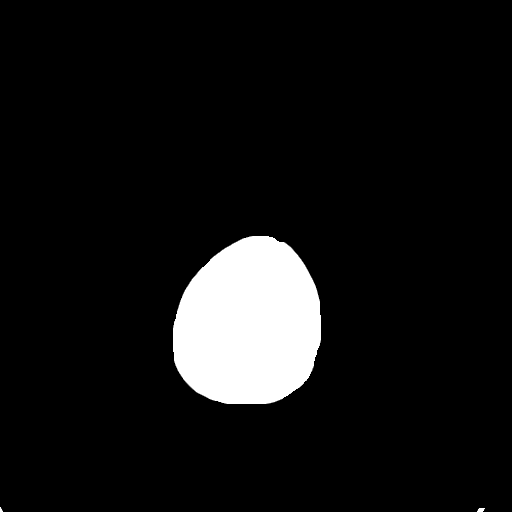
[im 29/32  bone]
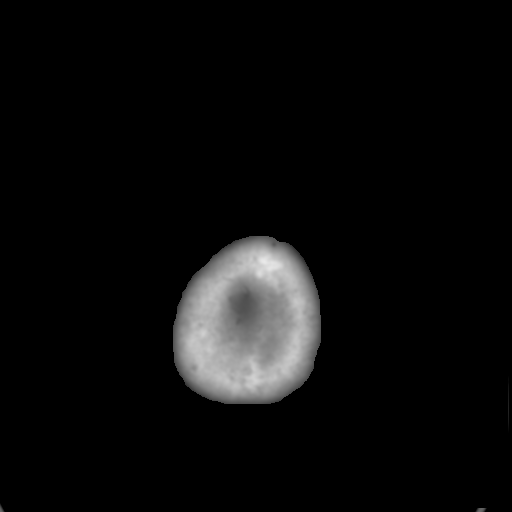

[Series 4: coronal soft tissue · coronal · 0.32mm/px · 3 of 66 slices shown]
[im 22/66  brain]
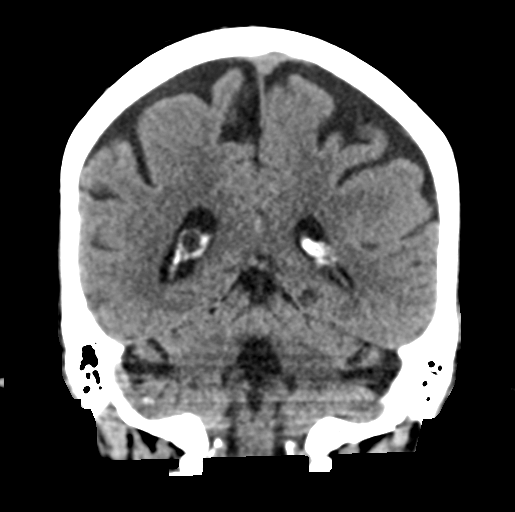
[im 29/66  brain]
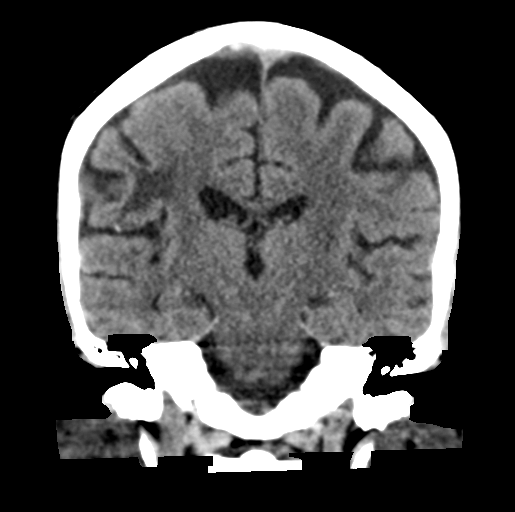
[im 37/66  brain]
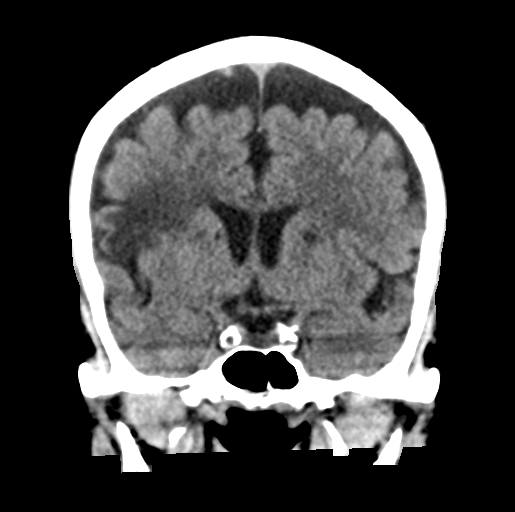

[Series 5: sagittal soft tissue · sagittal · 0.32mm/px · 3 of 56 slices shown]
[im 19/56  brain]
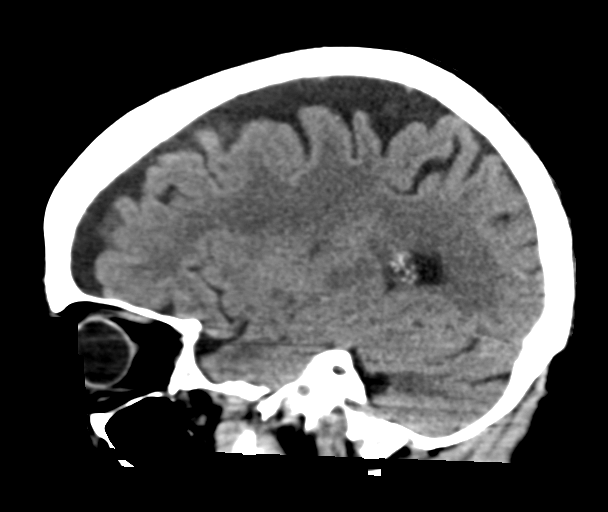
[im 28/56  brain]
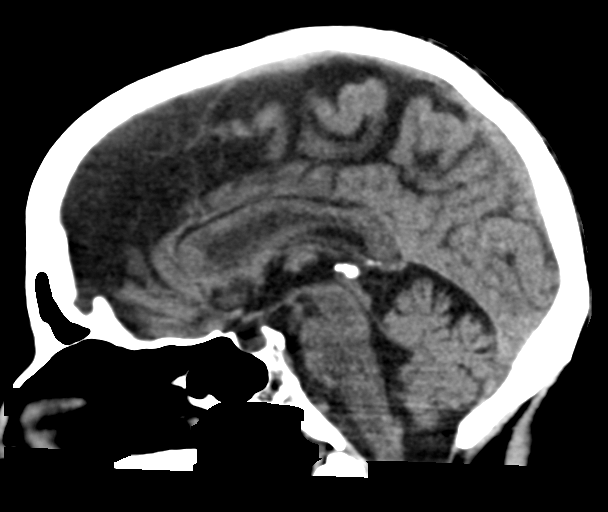
[im 37/56  brain]
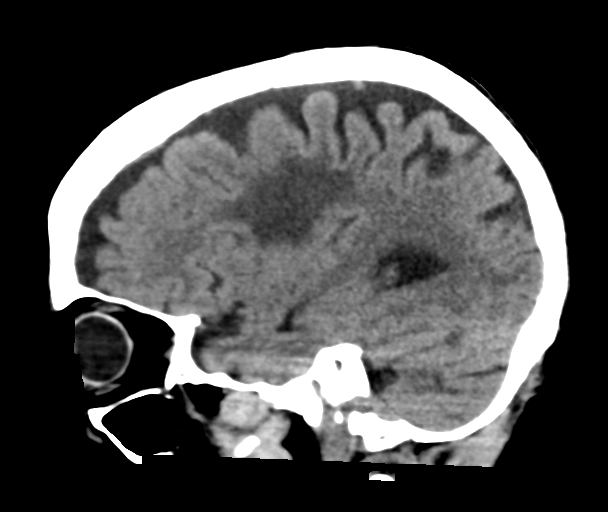

[15 of 47 positions shown; findings below may reference images not displayed]

FINDINGS: Brain: Remote lacunar infarcts of the basal ganglia and internal
capsule are stable. Remote right MCA territory encephalomalacia is
stable.

No acute infarct, hemorrhage, or mass lesion is present.

The insular cortex is within normal limits bilaterally. No new
cortical lesions are present. Remote lacunar infarcts are present in
the cerebellum bilaterally. Brainstem is within normal limits.

The ventricles are of normal size. No significant extraaxial fluid
collection is present.

Vascular: Dense atherosclerotic changes are present within the
cavernous internal carotid arteries bilaterally and at the dural
margin of both vertebral arteries. There is no significant
hyperdense vessel.

Skull: Calvarium is intact. No focal lytic or blastic lesions are
present.

Sinuses/Orbits: The paranasal sinuses and mastoid air cells are
clear. Globes and orbits are within normal limits.

ASPECTS (Alberta Stroke Program Early CT Score)

- Ganglionic level infarction (caudate, lentiform nuclei, internal
capsule, insula, M1-M3 cortex): [DATE]

- Supraganglionic infarction (M4-M6 cortex): [DATE]

Total score (0-10 with 10 being normal): [DATE]
IMPRESSION: 1. No acute intracranial abnormality.
2. Stable right MCA territory encephalomalacia without definite
expansion of the infarct territory.
3. Remote lacunar infarcts of the basal ganglia and cerebellum
bilaterally are stable.
4. Atherosclerosis.
5. ASPECTS is [DATE]

These results were called by telephone at the time of interpretation
on 07/06/2018 at [DATE] to Dr. CIJO CABAHUG , who verbally
acknowledged these results.

## 2019-01-28 IMAGING — MR MR HEAD W/O CM
10 of 11 series · 43 of 48 positions shown · non-contrast
Comparison: CTA head and neck, CT perfusion and noncontrast head CT
07/06/2018. [HOSPITAL] brain MRI 05/19/2018 and earlier.

CLINICAL DATA: 76-year-old female code stroke presentation on
07/06/2018. Chronic right MCA infarct

EXAM:
MRI HEAD WITHOUT CONTRAST
TECHNIQUE: Multiplanar, multiecho pulse sequences of the brain and surrounding
structures were obtained without intravenous contrast.

[Series 5: DWI · axial · 4.0mm · 0.88mm/px · z∈[-63,+73]mm · 7 of 70 slices shown (1 of 2)]
[im 1/70]
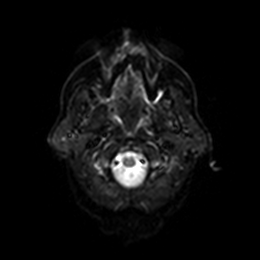
[im 12/70]
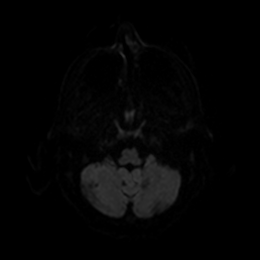
[im 24/70]
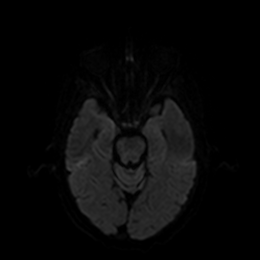
[im 35/70]
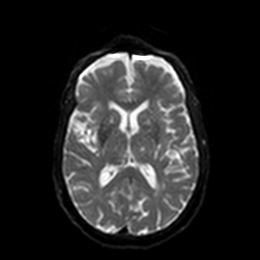
[im 47/70]
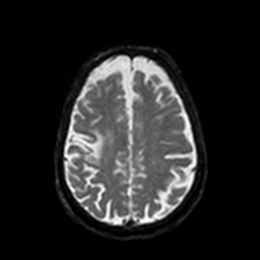
[im 58/70]
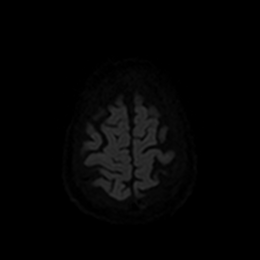
[im 70/70]
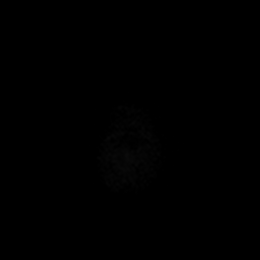

[Series 6: DWI · axial · 4.0mm · 0.88mm/px · z∈[-63,+73]mm · 4 of 35 slices shown (2 of 2)]
[im 1/35]
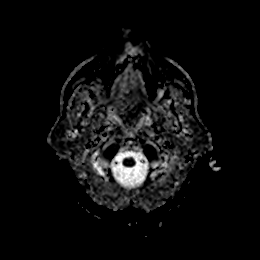
[im 12/35]
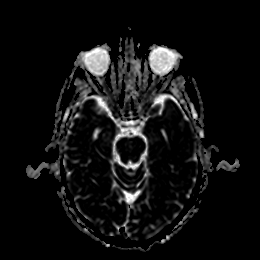
[im 23/35]
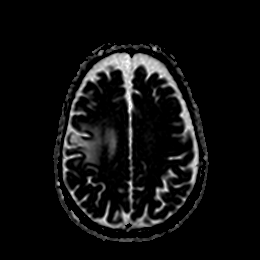
[im 35/35]
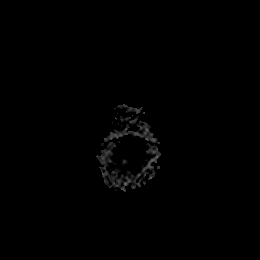

[Series 7: cor dwi_tracew · coronal · 5.0mm · 1.44mm/px · 7 of 64 slices shown]
[im 1/64]
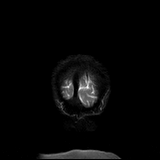
[im 11/64]
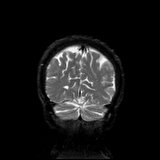
[im 22/64]
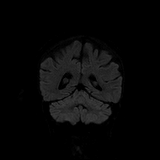
[im 32/64]
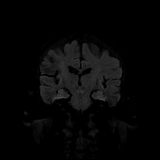
[im 43/64]
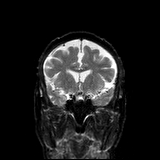
[im 53/64]
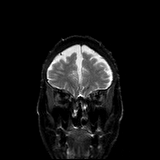
[im 64/64]
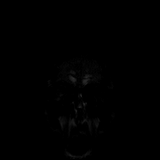

[Series 8: cor dwi_adc · coronal · 5.0mm · 1.44mm/px · 4 of 32 slices shown]
[im 1/32]
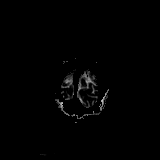
[im 11/32]
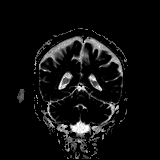
[im 21/32]
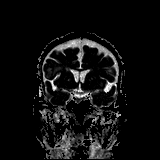
[im 32/32]
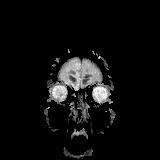

[Series 9: T1 · sagittal · 5.0mm · 0.75mm/px · 3 of 23 slices shown]
[im 1/23]
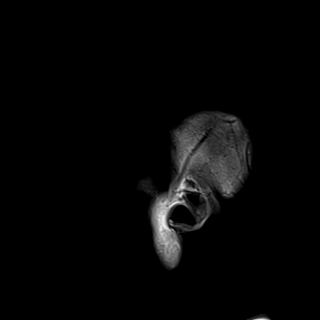
[im 12/23]
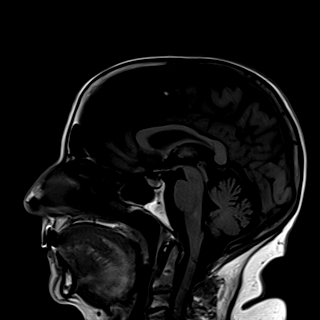
[im 23/23]
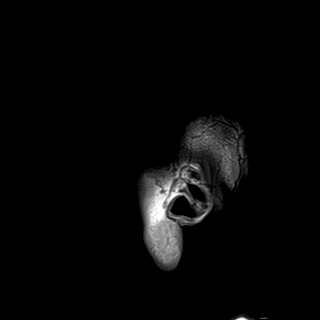

[Series 10: T2 · axial · 5.0mm · 0.72mm/px · z∈[-68,+75]mm · 3 of 25 slices shown (1 of 2)]
[im 1/25]
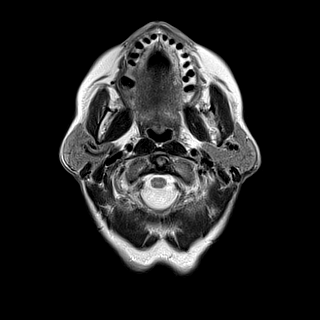
[im 13/25]
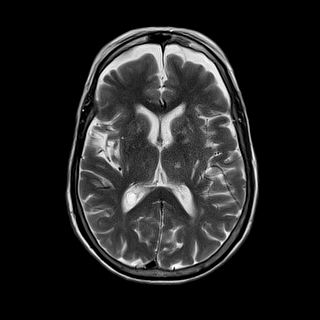
[im 25/25]
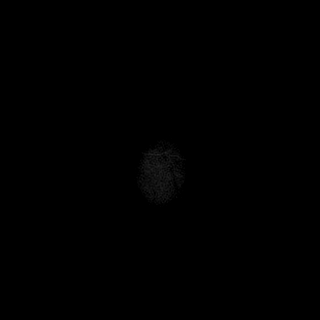

[Series 11: FLAIR · axial · 5.0mm · 0.45mm/px · z∈[-69,+75]mm · 3 of 25 slices shown]
[im 1/25]
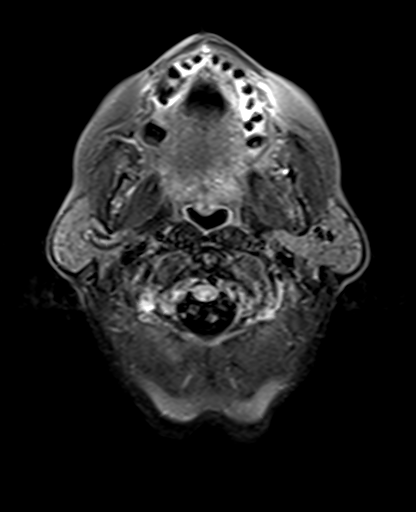
[im 13/25]
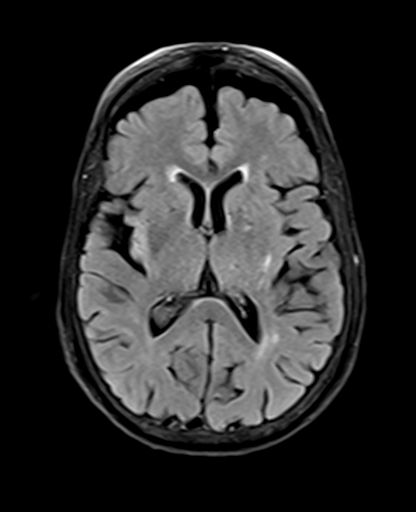
[im 25/25]
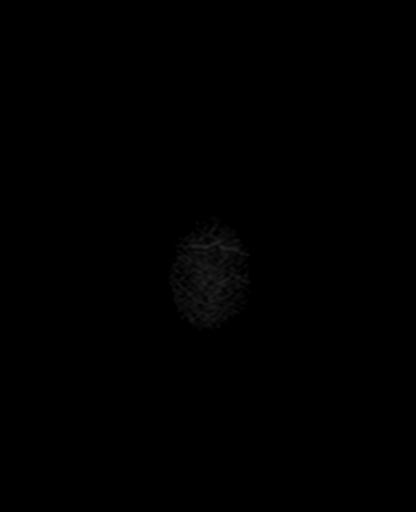

[Series 12: swi_images · axial · 3.0mm · 0.90mm/px · z∈[-69,+72]mm · 5 of 48 slices shown]
[im 1/48]
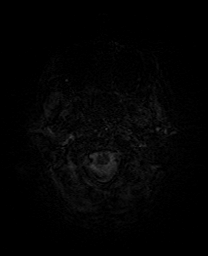
[im 12/48]
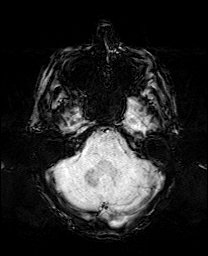
[im 24/48]
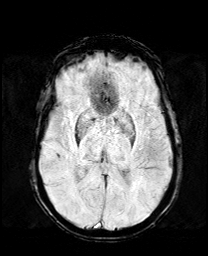
[im 36/48]
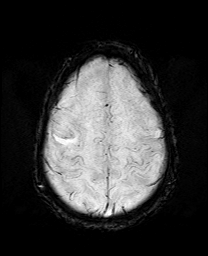
[im 48/48]
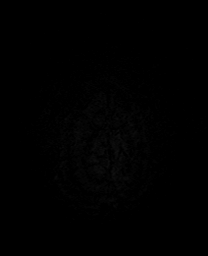

[Series 13: mip_images(sw) · axial · 24.0mm · 0.90mm/px · z∈[-58,+61]mm · 4 of 41 slices shown]
[im 1/41]
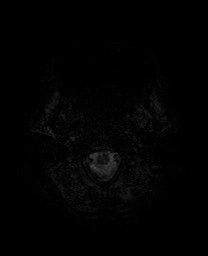
[im 14/41]
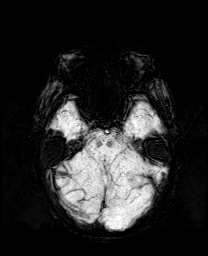
[im 27/41]
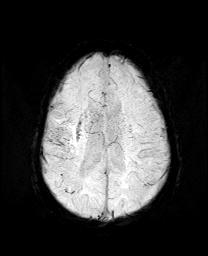
[im 41/41]
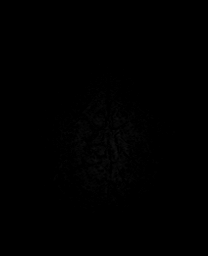

[Series 15: T2 · coronal · 5.0mm · 0.34mm/px · 3 of 27 slices shown (2 of 2)]
[im 1/27]
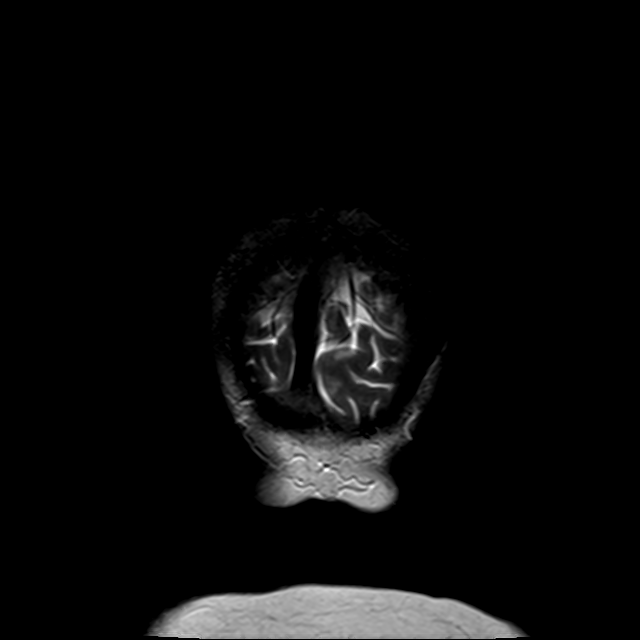
[im 14/27]
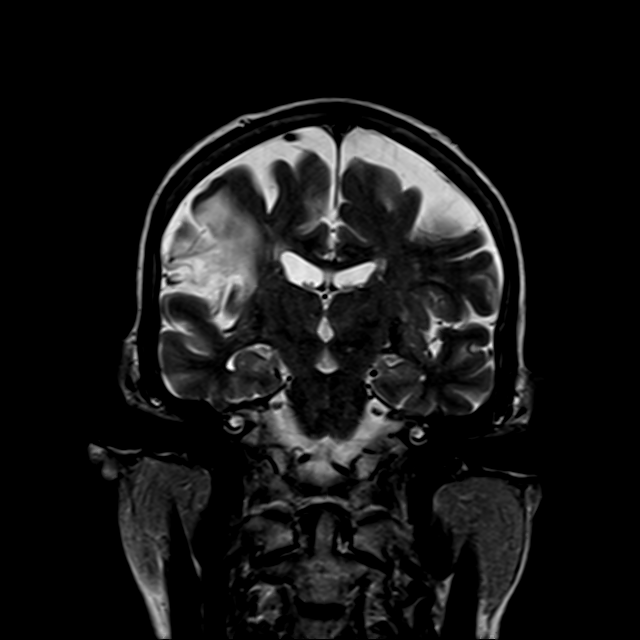
[im 27/27]
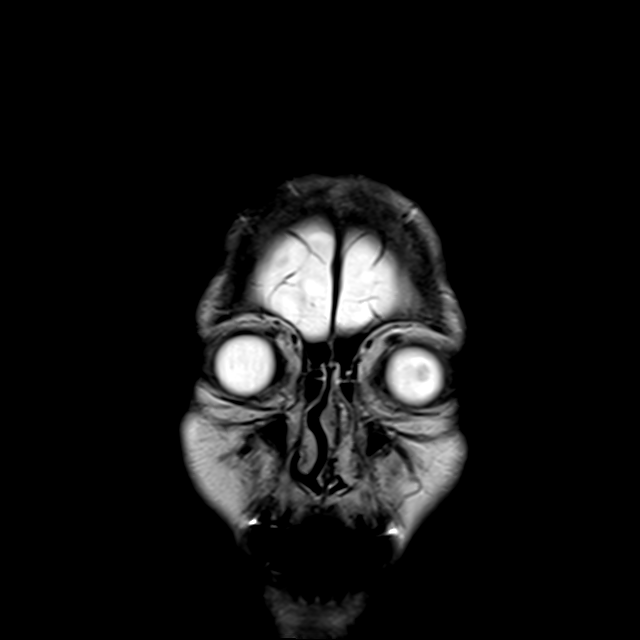

[43 of 48 positions shown; findings below may reference images not displayed]

FINDINGS: Brain: Persistent small area of trace diffusion abnormality in the
left superior frontal gyrus subcortical white matter (series 5,
image 60 and series 7, image 45), not significantly changed since
[REDACTED]. Corresponding T2 and FLAIR hyperintensity has developed. No
hemorrhage or mass effect.

New punctate focus of restricted diffusion right inferior occipital
lobe white matter on series 5, image 46. No hemorrhage or mass
effect.

No other convincing restricted diffusion. Stable middle right MCA
territory encephalomalacia with hemosiderin. Stable small chronic
infarcts in the cerebellum and left deep gray matter. Stable patchy
T2 and FLAIR hyperintensity in the pons and scattered cerebral white
matter. No midline shift, mass effect, evidence of mass lesion,
ventriculomegaly, extra-axial collection or acute intracranial
hemorrhage. Cervicomedullary junction and pituitary are within
normal limits.

Vascular: Major intracranial vascular flow voids are stable.

Skull and upper cervical spine: Chronic C3-C4 disc and endplate
degeneration. Visualized bone marrow signal is within normal limits.

Sinuses/Orbits: Stable and negative.

Other: Stable trace mastoid effusions. Negative nasopharynx. Scalp
and face soft tissues are negative.
IMPRESSION: 1. Punctate new white matter infarct in the anterior right occipital
lobe. No hemorrhage or mass effect.
2. No other acute intracranial abnormality.
Evolution of the small left superior frontal gyrus lacune since
[REDACTED] with persistent signal abnormality on trace diffusion.
Chronic middle right MCA territory encephalomalacia.
Chronic underlying small vessel disease.

## 2019-03-16 ENCOUNTER — Other Ambulatory Visit: Payer: Self-pay | Admitting: *Deleted

## 2019-03-16 ENCOUNTER — Encounter: Payer: Self-pay | Admitting: *Deleted

## 2019-03-16 NOTE — Patient Outreach (Signed)
[employer] High Risk Referral for HTN  Spoke with Ms. Horne today who has had a 3 year hx of being involved with [facility] team members (RN, Pharmacy and LCSW). She has not been active this year. She agrees to participate again and we will focus on HTN, completing Advanced Directives and improving mobility with daily exercise.  She resides alone and her grand daughter stays nights with her. Her son lives next door. She stays in her home and only goes out for MD appointments at this time. Her son is able to get her down 2 steps in a wheel chair.  Outpatient Encounter Medications as of 03/16/2019  Medication Sig Note  . acetaminophen (TYLENOL) 325 MG tablet Take 2 tablets (650 mg total) by mouth every 6 (six) hours as needed for headache.   Marland Kitchen aspirin EC 81 MG tablet Take 81 mg by mouth daily.   Marland Kitchen atorvastatin (LIPITOR) 80 MG tablet Take 1 tablet (80 mg total) by mouth daily.   . calcium citrate (CALCITRATE - DOSED IN MG ELEMENTAL CALCIUM) 950 MG tablet Take 1 tablet (200 mg of elemental calcium total) by mouth daily.   . carvedilol (COREG) 12.5 MG tablet Take 1 tablet (12.5 mg total) by mouth 2 (two) times daily with a meal.   . clopidogrel (PLAVIX) 75 MG tablet Take 1 tablet (75 mg total) by mouth daily with breakfast.   . docusate sodium (COLACE) 100 MG capsule Take 100 mg by mouth 3 (three) times daily.   Marland Kitchen HYDROcodone-acetaminophen (NORCO) 10-325 MG tablet Take 1 tablet by mouth every 6 (six) hours as needed for severe pain. 03/16/2019: States she takes this 1-2 times a week for wrist or hip pain.  . methocarbamol (ROBAXIN) 500 MG tablet Take 500 mg by mouth 4 (four) times daily. 03/16/2019: Takes as needed.  . Multiple Vitamins-Minerals (CENTRUM ADULTS) TABS Take 1 tablet by mouth daily.   . niacin (NIASPAN) 500 MG CR tablet Take 1 tablet by mouth daily.   Marland Kitchen omeprazole (PRILOSEC) 20 MG capsule Take 1 capsule (20 mg total) by mouth daily.   . traZODone (DESYREL) 50 MG tablet    . Furosemide (LASIX PO) Take by  mouth as needed. 03/16/2019: Has for as needed use. Reports this makes her legs ache. CONSIDER Potassium Chloride dose to be taken with this.  Marland Kitchen ondansetron (ZOFRAN) 4 MG tablet TAKE (1) TABLET BY MOUTH EVERY 8 HOURS AS NEEDED. (Patient not taking: Reported on 03/16/2019)   . ranitidine (ZANTAC) 150 MG tablet Take 1 tablet by mouth 2 (two) times daily as needed.   . zolpidem (AMBIEN) 10 MG tablet Take 10 mg by mouth at bedtime as needed for sleep. 03/16/2019: Has not taken recently. Encouraged to avoid, not recommended.  . [DISCONTINUED] LORazepam (ATIVAN) 1 MG tablet Take 1 tablet (1 mg total) by mouth daily as needed.   . [DISCONTINUED] Oxcarbazepine (TRILEPTAL) 300 MG tablet Take 1 tablet (300 mg total) by mouth 2 (two) times daily. (Patient not taking: Reported on 03/16/2019)    No facility-administered encounter medications on file as of 03/16/2019.    Fall Risk  03/16/2019 09/27/2018 07/29/2018 05/03/2018 04/01/2018  Falls in the past year? 1 0 1 No No  Comment - - last fall October 2019 - -  Number falls in past yr: 1 - 1 - -  Injury with Fall? 1 - - - -  Risk Factor Category  - - - - -  Risk for fall due to : History of fall(s);Impaired balance/gait;Impaired  mobility - - - -  Risk for fall due to: Comment - - - - -  Follow up Falls evaluation completed - - - -   Depression screen [facility] 2/9 03/16/2019 03/16/2019 07/29/2018 05/13/2018 12/28/2017  Decreased Interest 0 0 0 0 0  Down, Depressed, Hopeless 0 0 0 1 0  PHQ - 2 Score 0 0 0 1 0  Altered sleeping - - 1 - -  Tired, decreased energy - - 0 - -  Change in appetite - - 0 - -  Feeling bad or failure about yourself  - - 0 - -  Trouble concentrating - - 0 - -  Moving slowly or fidgety/restless - - 0 - -  Suicidal thoughts - - 0 - -  PHQ-9 Score - - 1 - -  Difficult doing work/chores - - Somewhat difficult - -  Some recent data might be hidden   THN CM Care Plan Problem One     Most Recent Value  Care Plan Problem One  No Advanced Directives  Role  Documenting the Problem One  Care Management Assistant  Care Plan for Problem One  Active  THN Long Term Goal   Pt will complete her HCPOA, Living Will and MOST form by the end of 90 days.  THN Long Term Goal Start Date  03/16/19  Interventions for Problem One Long Term Goal  Affirmed pt has documents. Encouraged completion of HCPOA and Living Will and MOST form. Will continue this discussion.  THN CM Short Term Goal #1   Pt will acknowledge receipt of MOST form and discuss it's importance on next phone call on 04/19/19.  Interventions for Short Term Goal #1  Providing MOST form with Welcome Letter.    THN CM Care Plan Problem Two     Most Recent Value  Care Plan Problem Two  High risk for falls, sedentary and not doing much home exercise.  Role Documenting the Problem Two  Care Management Assistant  Care Plan for Problem Two  Active  Interventions for Problem Two Long Term Goal   Discussed importance of continued strengthening exercises. Encouraged watching and participating in TV exercise program.  THN Long Term Goal  Pt will state that she feels stronger and more secure when standing and walking.  THN Long Term Goal Start Date  03/16/19    [facility] CM Care Plan Problem Three     Most Recent Value  Care Plan Problem Three  HTN  Role Documenting the Problem Three  Care Management Coordinator  Care Plan for Problem Three  Active  THN Long Term Goal   Pt will check her blood pressure daily and record by the end of 90 days.  THN Long Term Goal Start Date  03/16/19  Interventions for Problem Three Long Term Goal  Encourage home BP monitoring.     I will call pt next month for follow up.  Eulah Pont. Myrtie Neither, MSN, [facility] Gerontological Nurse Practitioner [facility] Care Management [phone]

## 2019-04-19 ENCOUNTER — Other Ambulatory Visit: Payer: Self-pay

## 2019-04-19 ENCOUNTER — Other Ambulatory Visit: Payer: Self-pay | Admitting: *Deleted

## 2019-04-19 ENCOUNTER — Encounter: Payer: Self-pay | Admitting: *Deleted

## 2019-04-19 NOTE — Patient Outreach (Signed)
Monthly telephone assessment with focus on HTN, Advanced Directives and Exercise.  Pt is feeling very well. She uses [employer] monitoring system daily and manual BPs. Readings are <130/80. Endouraged continuation of routine.  Did not receive advanced directives information. I will resend.  Exercise is limited due to L ankle instability. Pt has a brace she uses when she goes out of the house but doesn't use it in the house. Have encouraged her to use this at home to so she feels stable and more confident in walking. Encouraged to walk as this will keep her muscles strong and reduce her chances of falling.  I will call her again in one month.  Eulah Pont. Myrtie Neither, MSN, [facility] Gerontological Nurse Practitioner [facility] Care Management [phone]

## 2019-04-29 DIAGNOSIS — G4700 Insomnia, unspecified: Secondary | ICD-10-CM | POA: Diagnosis not present

## 2019-04-29 DIAGNOSIS — M25552 Pain in left hip: Secondary | ICD-10-CM | POA: Diagnosis not present

## 2019-05-17 ENCOUNTER — Other Ambulatory Visit: Payer: Self-pay

## 2019-05-17 ENCOUNTER — Other Ambulatory Visit: Payer: Self-pay | Admitting: *Deleted

## 2019-05-17 NOTE — Patient Outreach (Signed)
Monthly telephone assessment and care plan updates:  [facility] CM Care Plan Problem One     Most Recent Value  Care Plan Problem One  No Advanced Directives  (Pended)   Role Documenting the Problem One  Care Management Assistant  (Pended)   Care Plan for Problem One  Active  (Pended)   THN Long Term Goal   Pt will complete her HCPOA, Living Will and MOST form by the end of 90 days.  (Pended)   THN Long Term Goal Start Date  03/16/19  (Pended)   THN CM Short Term Goal #1   Pt will acknowledge receipt of MOST form and discuss it's importance on next phone call on 04/19/19.  (Pended)     THN CM Care Plan Problem Two     Most Recent Value  Care Plan Problem Two  High risk for falls, sedentary and not doing much home exercise.  (Pended)   Role Documenting the Problem Two  Care Management Assistant  (Pended)   Care Plan for Problem Two  Active  (Pended)   THN Long Term Goal  Pt will state that she feels stronger and more secure when standing and walking.  (Pended)   THN Long Term Goal Start Date  03/16/19  (Pended)     [facility] CM Care Plan Problem Three     Most Recent Value  Care Plan Problem Three  HTN  (Pended)   Role Documenting the Problem Three  Care Management Coordinator  (Pended)   Care Plan for Problem Three  Active  (Pended)   THN Long Term Goal   Pt will check her blood pressure daily and record by the end of 90 days.  (Pended)   THN Long Term Goal Start Date  03/16/19  (Pended)      I will call back in a month for our final call.  Eulah Pont. Myrtie Neither, MSN, [facility] Gerontological Nurse Practitioner [facility] Care Management [phone]

## 2019-05-24 DIAGNOSIS — J329 Chronic sinusitis, unspecified: Secondary | ICD-10-CM | POA: Diagnosis not present

## 2019-06-08 DIAGNOSIS — K219 Gastro-esophageal reflux disease without esophagitis: Secondary | ICD-10-CM | POA: Diagnosis not present

## 2019-06-08 DIAGNOSIS — I10 Essential (primary) hypertension: Secondary | ICD-10-CM | POA: Diagnosis not present

## 2019-06-08 DIAGNOSIS — I25110 Atherosclerotic heart disease of native coronary artery with unstable angina pectoris: Secondary | ICD-10-CM | POA: Diagnosis not present

## 2019-06-08 DIAGNOSIS — E782 Mixed hyperlipidemia: Secondary | ICD-10-CM | POA: Diagnosis not present

## 2019-06-13 ENCOUNTER — Encounter: Payer: Self-pay | Admitting: *Deleted

## 2019-06-13 ENCOUNTER — Other Ambulatory Visit: Payer: Self-pay

## 2019-06-13 ENCOUNTER — Other Ambulatory Visit: Payer: Self-pay | Admitting: *Deleted

## 2019-06-13 IMAGING — US US ABDOMEN LIMITED
1 series · 13 of 25 positions shown · non-contrast
Comparison: Right upper quadrant abdominal ultrasound June 30, 2016

CLINICAL DATA: Nausea and vomiting, gallstones. Mild gallbladder
wall thickening on abdominal CT today. History of cirrhosis, fatty
liver disease, diabetes.

EXAM:
ULTRASOUND ABDOMEN LIMITED RIGHT UPPER QUADRANT

[Series 1: us abdomen limited · 0.18mm/px · 13 of 51 slices shown]
[im 1/51]
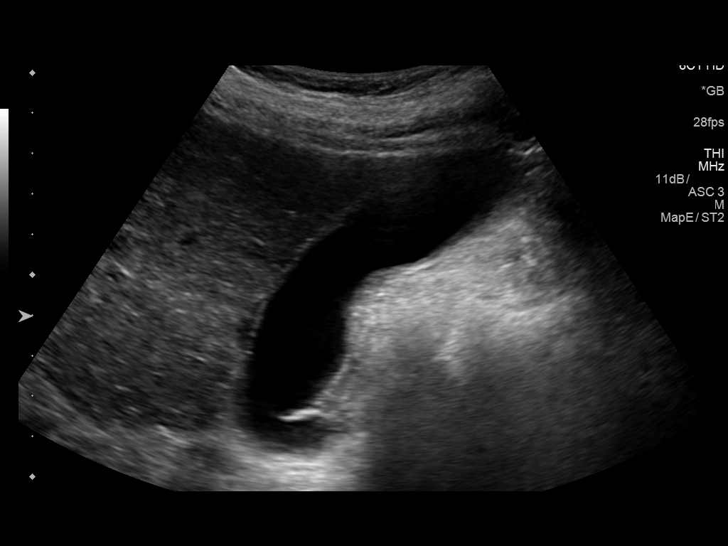
[im 5/51]
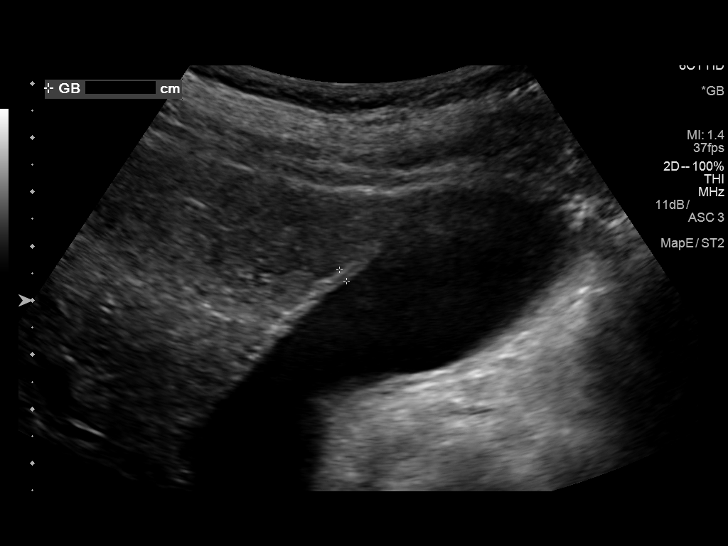
[im 9/51]
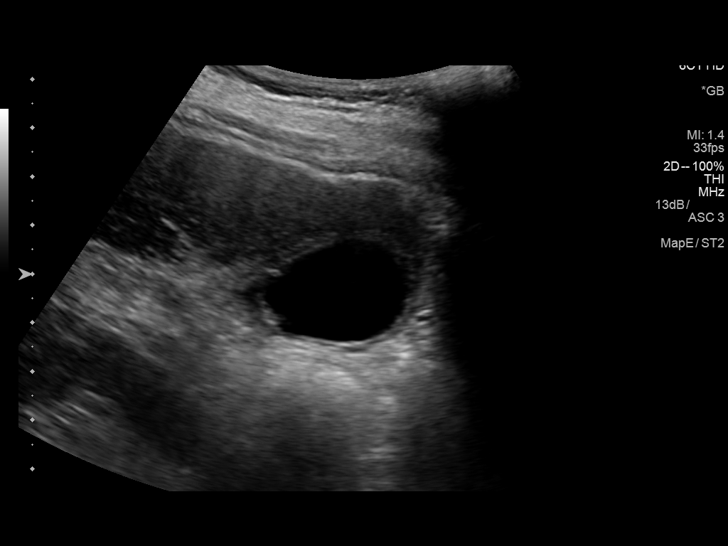
[im 13/51]
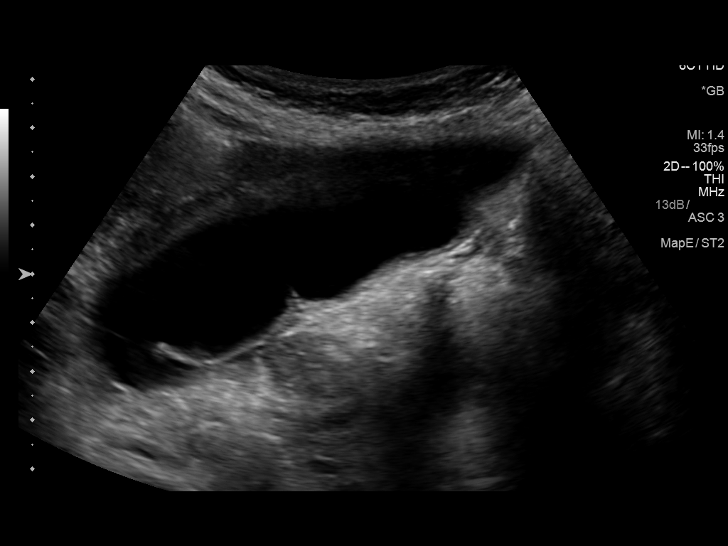
[im 17/51]
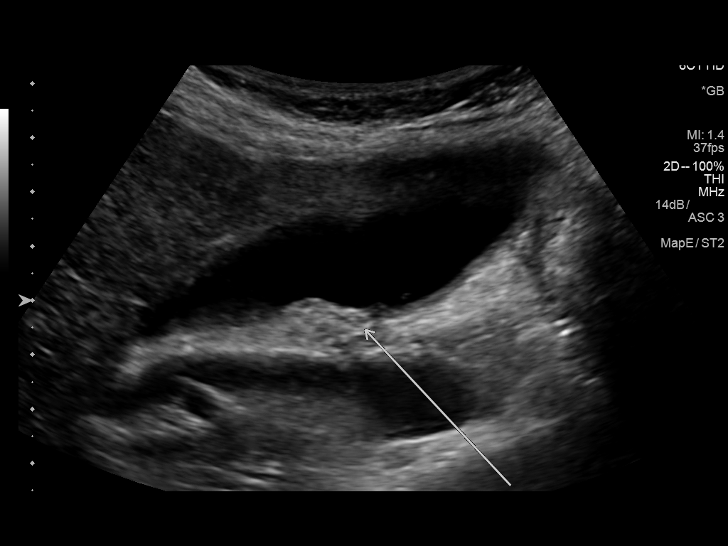
[im 21/51]
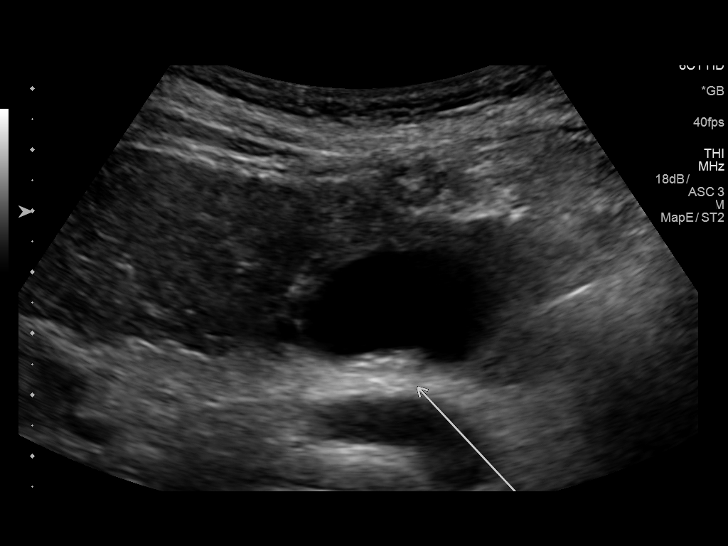
[im 26/51]
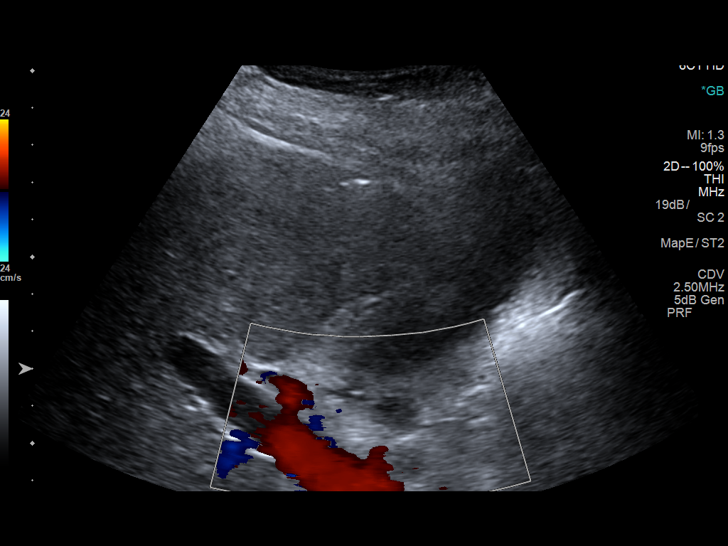
[im 30/51]
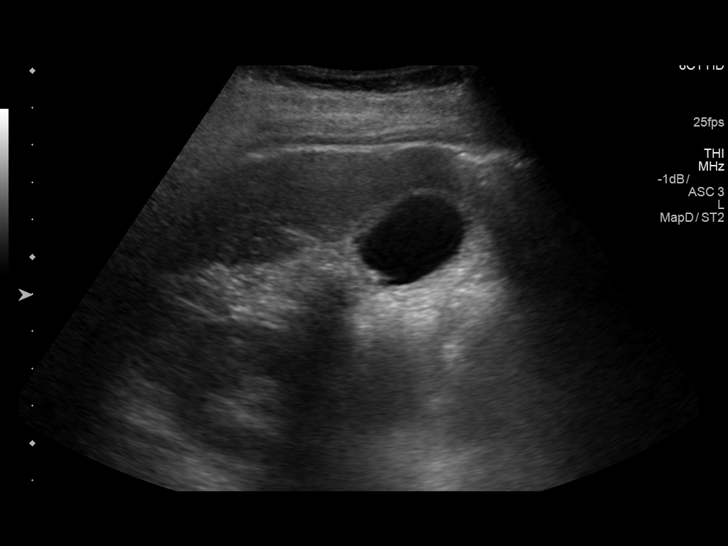
[im 34/51]
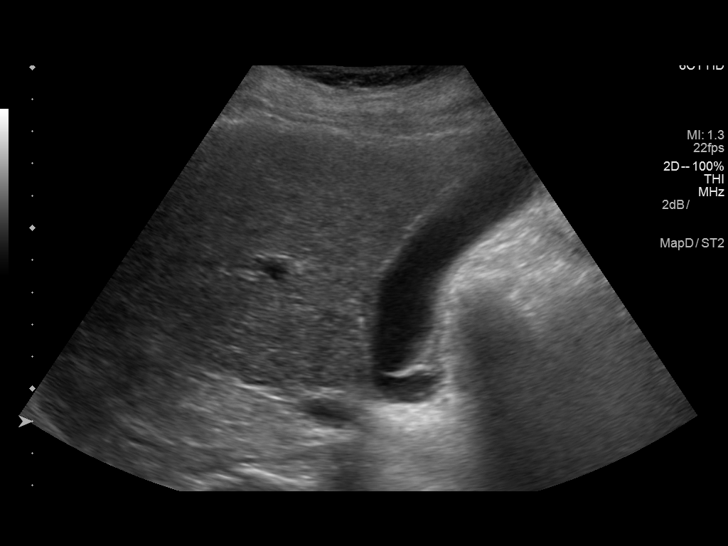
[im 38/51]
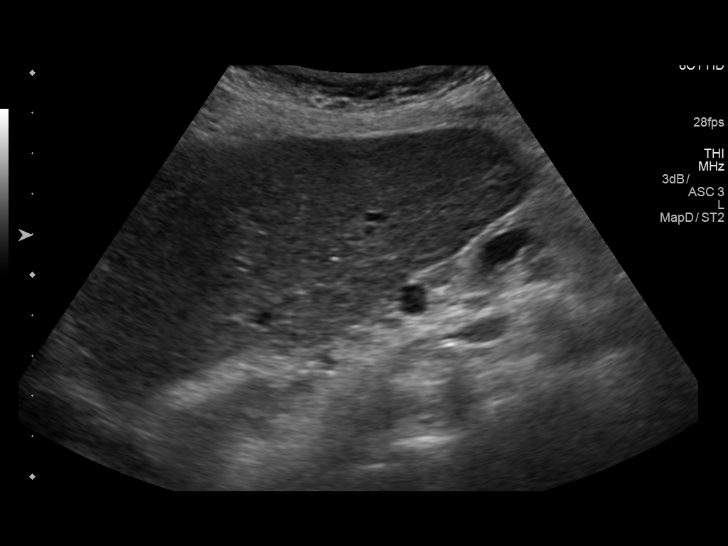
[im 42/51]
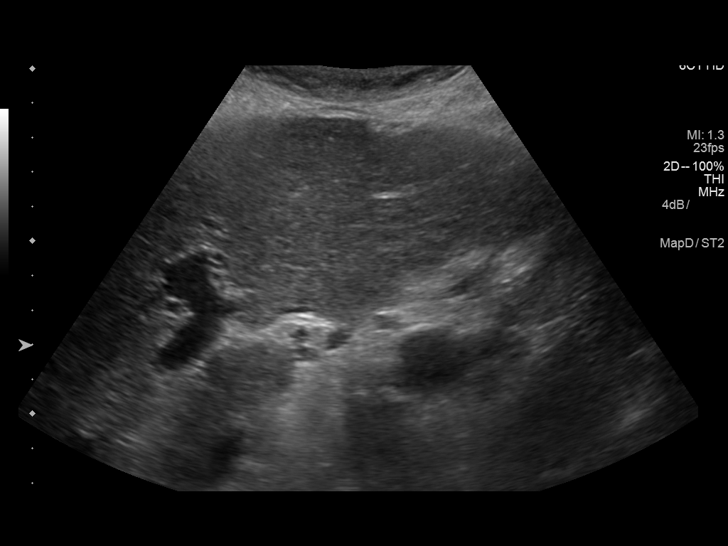
[im 46/51]
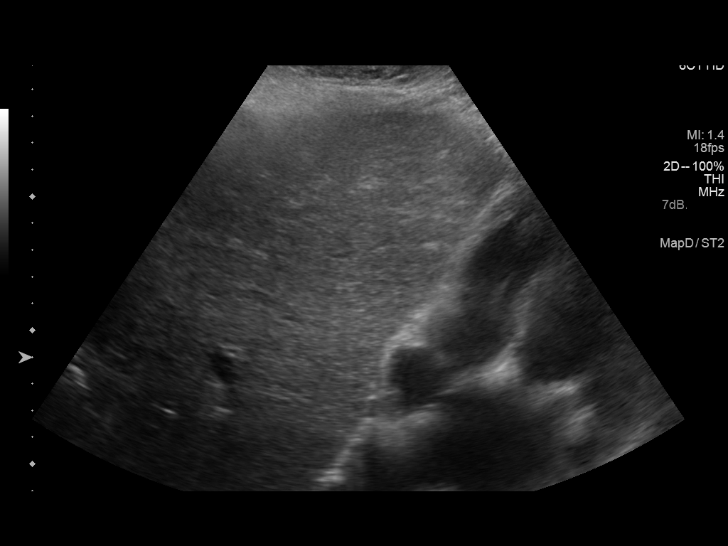
[im 51/51]
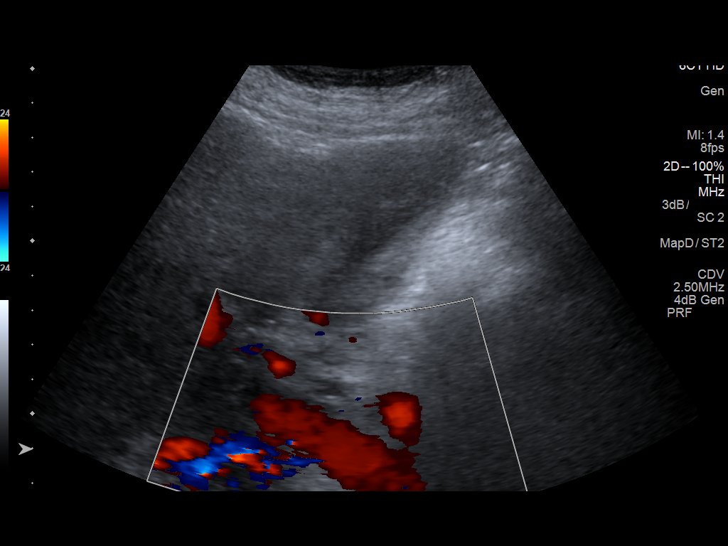

[13 of 25 positions shown; findings below may reference images not displayed]

FINDINGS: Gallbladder:

The gallbladder is adequately distended. There is echogenic bile or
sludge. There may be a tiny polyp on image 14. No discrete stones
are observed. There is no gallbladder wall thickening,
pericholecystic fluid, or positive sonographic Murphy's sign.

Common bile duct:

Diameter: Visualization of the common bile duct was difficult due to
bowel gas but it appears to measure 3 mm in diameter.

Liver:

The hepatic echotexture is heterogeneously increased. The surface
contour is nodular. There is no focal mass nor ductal dilation.
Portal vein is patent on color Doppler imaging with normal direction
of blood flow towards the liver.
IMPRESSION: Gallbladder sludge.  No sonographic evidence of acute cholecystitis.

Top-normal common bile duct diameter where visualized.

Heterogeneously increased hepatic echotexture with surface contour
nodularity consistent with known cirrhosis.

## 2019-06-13 NOTE — Patient Outreach (Signed)
Final telephone assessment for care management. Pt has met all her goals EXCEPT completing her Advanced Direcitives. She keeps misplacing them.  She denies any falls, reports feeling much stronger.  Her blood pressures have been well within the normal range, last was 110/80.  THN CM Care Plan Problem One     Most Recent Value  Care Plan Problem One  No Advanced Directives  Role Documenting the Problem One  Care Management Assistant  Care Plan for Problem One  Active  THN Long Term Goal   Pt will complete her HCPOA, Living Will and MOST form by the end of 90 days.  THN Long Term Goal Start Date  03/16/19  Interventions for Problem One Long Term Goal  I will send another set, this will be the 3rd time. I have given her instructions to fil out the MOST form, HCPOA and Living Will except for the signature page on the last 2. She should take the documents toi her provider so he can also have a copy and the named HCPOA should also have a copy. The MOST form should be posted in the entry that the ambulance squad would likely enter so they can see it.  THN CM Short Term Goal #1 Met Date  05/17/19  Interventions for Short Term Goal #1  Sending again.    THN CM Care Plan Problem Two     Most Recent Value  Care Plan Problem Two  High risk for falls, sedentary and not doing much home exercise.  Role Documenting the Problem Two  Care Management Assistant  Care Plan for Problem Two  Not Active  Interventions for Problem Two Long Term Goal   Continue maintancenc exercises.  THN Long Term Goal  Pt will state that she feels stronger and more secure when standing and walking.  THN Long Term Goal Start Date  03/16/19  THN Long Term Goal Met Date  06/13/19    [facility] CM Care Plan Problem Three     Most Recent Value  Care Plan Problem Three  HTN  Role Documenting the Problem Three  Care Management Coordinator  Care Plan for Problem Three  Not Active  THN Long Term Goal Start Date  03/16/19  [facility] Long Term  Goal Met Date  05/17/19     I will send her another set of Advanced Directives and MOST form.  I am closing her case now, we both feel she has done well and progressed. She knows she can call me in the future should she have any questions or problems.  Eulah Pont. Myrtie Neither, MSN, [facility] Gerontological Nurse Practitioner [facility] Care Management [phone]

## 2019-06-16 DIAGNOSIS — Z6823 Body mass index (BMI) 23.0-23.9, adult: Secondary | ICD-10-CM | POA: Diagnosis not present

## 2019-06-16 DIAGNOSIS — Z23 Encounter for immunization: Secondary | ICD-10-CM | POA: Diagnosis not present

## 2019-06-16 DIAGNOSIS — R002 Palpitations: Secondary | ICD-10-CM | POA: Diagnosis not present

## 2019-06-16 DIAGNOSIS — E7489 Other specified disorders of carbohydrate metabolism: Secondary | ICD-10-CM | POA: Diagnosis not present

## 2019-06-16 DIAGNOSIS — B370 Candidal stomatitis: Secondary | ICD-10-CM | POA: Diagnosis not present

## 2019-06-16 DIAGNOSIS — Z79899 Other long term (current) drug therapy: Secondary | ICD-10-CM | POA: Diagnosis not present

## 2019-06-16 DIAGNOSIS — I8500 Esophageal varices without bleeding: Secondary | ICD-10-CM | POA: Diagnosis not present

## 2019-06-16 DIAGNOSIS — N390 Urinary tract infection, site not specified: Secondary | ICD-10-CM | POA: Diagnosis not present

## 2019-06-16 DIAGNOSIS — E1140 Type 2 diabetes mellitus with diabetic neuropathy, unspecified: Secondary | ICD-10-CM | POA: Diagnosis not present

## 2019-06-20 ENCOUNTER — Other Ambulatory Visit: Payer: Self-pay | Admitting: Internal Medicine

## 2019-06-20 ENCOUNTER — Other Ambulatory Visit (HOSPITAL_COMMUNITY): Payer: Self-pay | Admitting: Internal Medicine

## 2019-06-20 DIAGNOSIS — R1310 Dysphagia, unspecified: Secondary | ICD-10-CM

## 2019-06-24 ENCOUNTER — Ambulatory Visit (HOSPITAL_COMMUNITY): Payer: Medicare Other

## 2019-06-24 ENCOUNTER — Encounter (HOSPITAL_COMMUNITY): Payer: Self-pay

## 2019-07-09 DIAGNOSIS — M1991 Primary osteoarthritis, unspecified site: Secondary | ICD-10-CM | POA: Diagnosis not present

## 2019-07-09 DIAGNOSIS — I10 Essential (primary) hypertension: Secondary | ICD-10-CM | POA: Diagnosis not present

## 2019-07-09 DIAGNOSIS — E7849 Other hyperlipidemia: Secondary | ICD-10-CM | POA: Diagnosis not present

## 2019-07-25 DIAGNOSIS — D696 Thrombocytopenia, unspecified: Secondary | ICD-10-CM | POA: Diagnosis not present

## 2019-07-25 DIAGNOSIS — I739 Peripheral vascular disease, unspecified: Secondary | ICD-10-CM | POA: Diagnosis not present

## 2019-07-25 DIAGNOSIS — M1991 Primary osteoarthritis, unspecified site: Secondary | ICD-10-CM | POA: Diagnosis not present

## 2019-07-25 DIAGNOSIS — I639 Cerebral infarction, unspecified: Secondary | ICD-10-CM | POA: Diagnosis not present

## 2019-08-25 ENCOUNTER — Other Ambulatory Visit: Payer: Self-pay

## 2019-08-25 ENCOUNTER — Ambulatory Visit: Payer: Medicare Other | Attending: Internal Medicine

## 2019-08-25 DIAGNOSIS — Z20822 Contact with and (suspected) exposure to covid-19: Secondary | ICD-10-CM

## 2019-08-27 ENCOUNTER — Telehealth: Payer: Self-pay | Admitting: General Practice

## 2019-08-27 LAB — NOVEL CORONAVIRUS, NAA: SARS-CoV-2, NAA: NOT DETECTED

## 2019-08-27 NOTE — Telephone Encounter (Signed)
Negative COVID results given. Patient results "NOT Detected." Caller expressed understanding. ° °

## 2019-09-08 DIAGNOSIS — E7849 Other hyperlipidemia: Secondary | ICD-10-CM | POA: Diagnosis not present

## 2019-09-08 DIAGNOSIS — I10 Essential (primary) hypertension: Secondary | ICD-10-CM | POA: Diagnosis not present

## 2019-09-22 IMAGING — US US CAROTID DUPLEX BILAT
1 series · 13 of 24 positions shown · non-contrast
Comparison: 04/10/2016

CLINICAL DATA: Carotid bruit and history of stroke, hypertension,
coronary artery disease, hyperlipidemia and diabetes.

EXAM:
BILATERAL CAROTID DUPLEX ULTRASOUND
TECHNIQUE: Gray scale imaging, color Doppler and duplex ultrasound were
performed of bilateral carotid and vertebral arteries in the neck.

[Series 1: us carotid duplex bilat · 0.06mm/px · 13 of 68 slices shown]
[im 1/68]
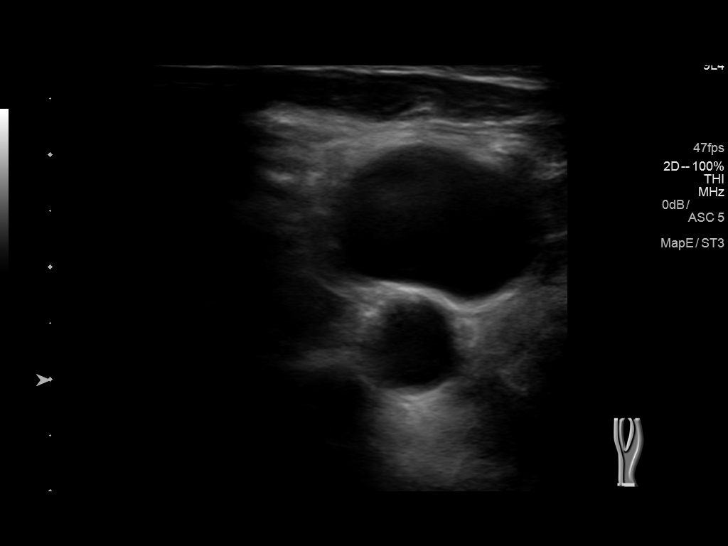
[im 6/68]
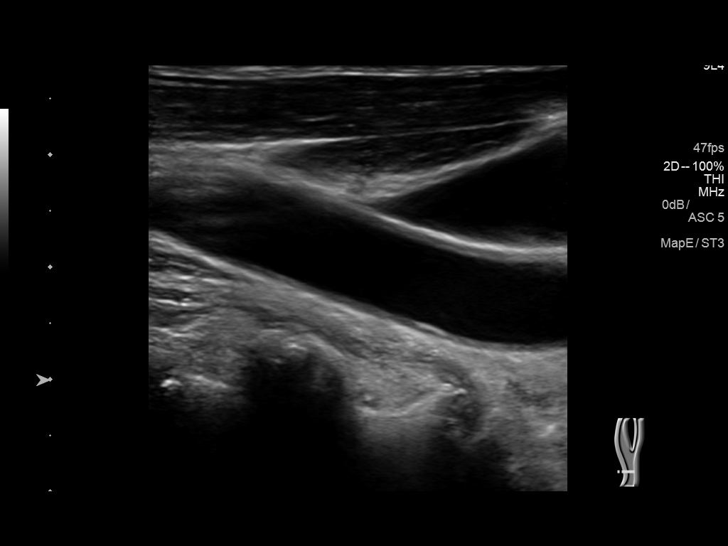
[im 12/68]
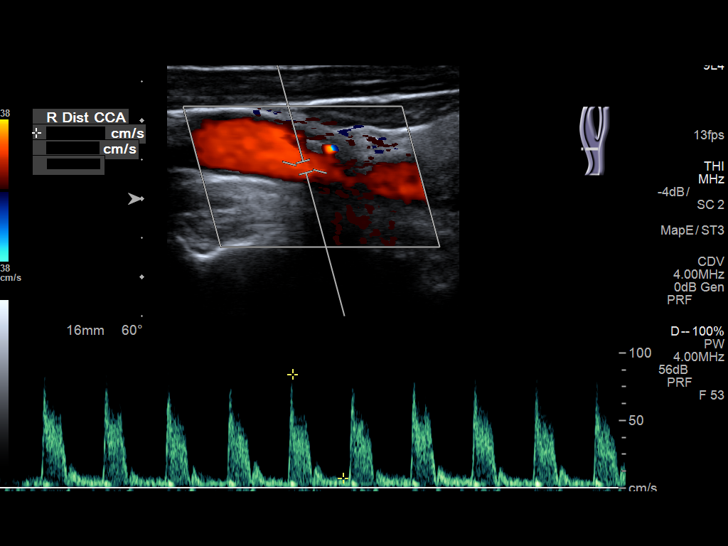
[im 18/68]
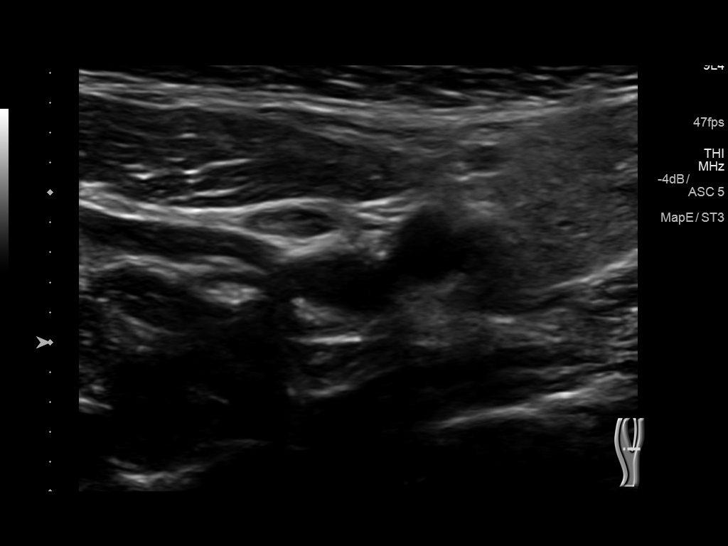
[im 24/68]
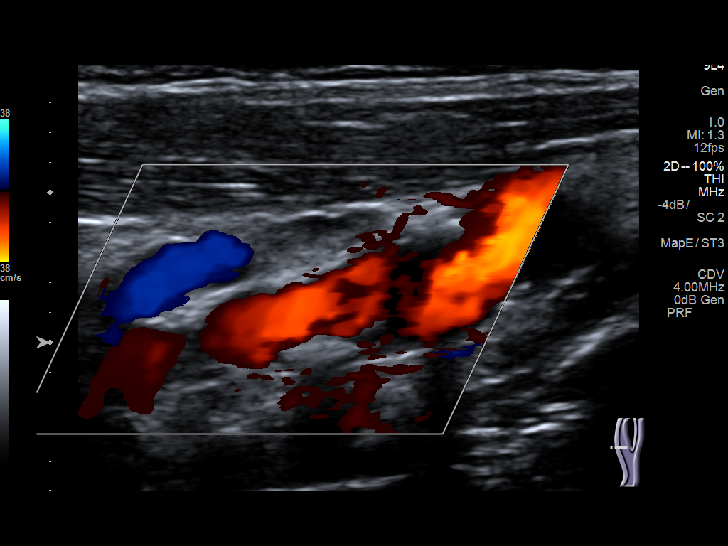
[im 30/68]
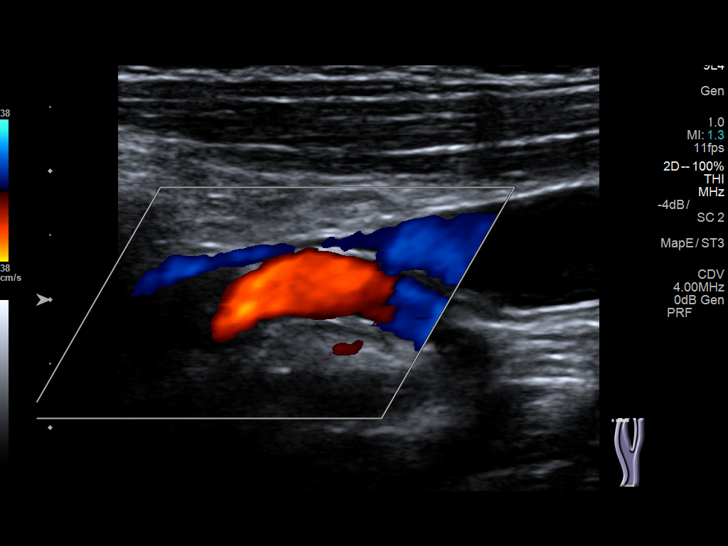
[im 35/68]
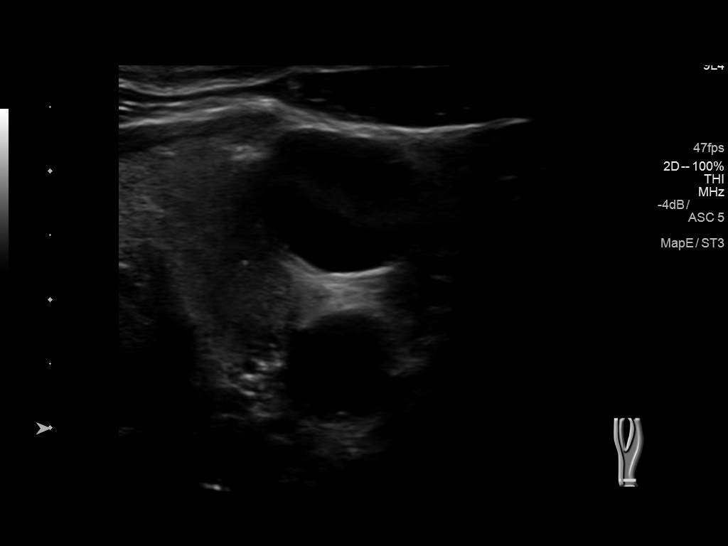
[im 38/68]
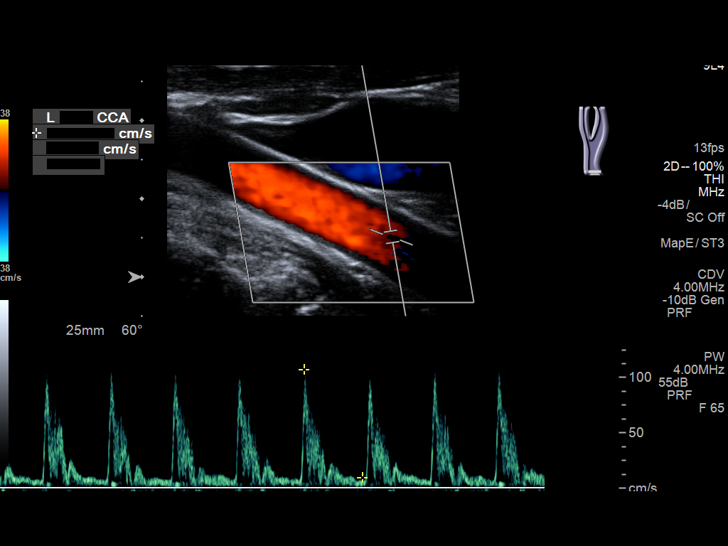
[im 44/68]
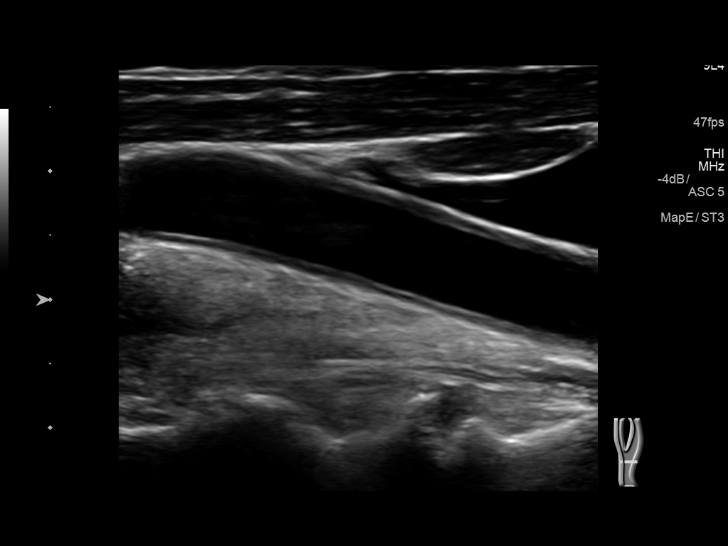
[im 50/68]
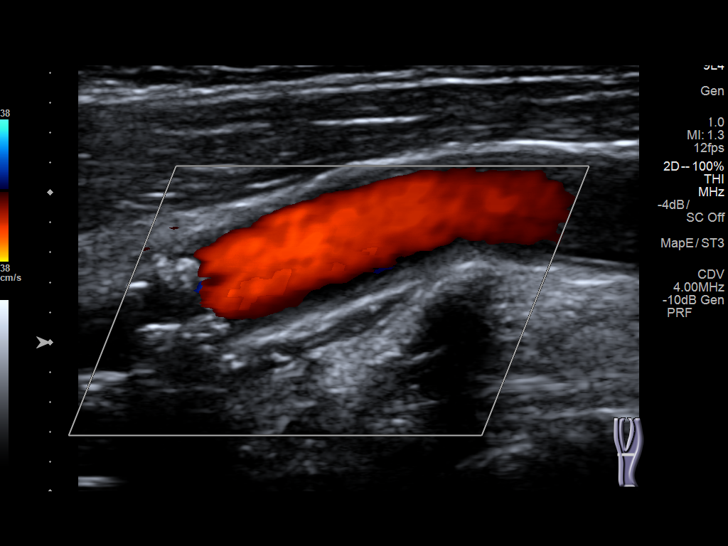
[im 56/68]
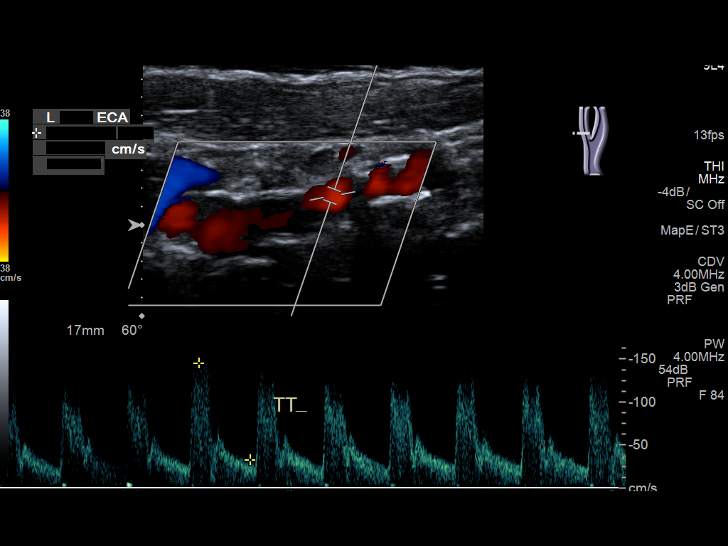
[im 62/68]
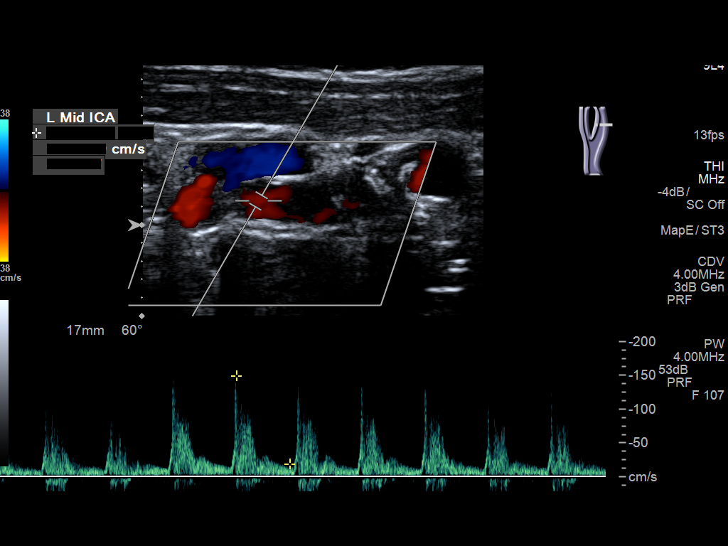
[im 68/68]
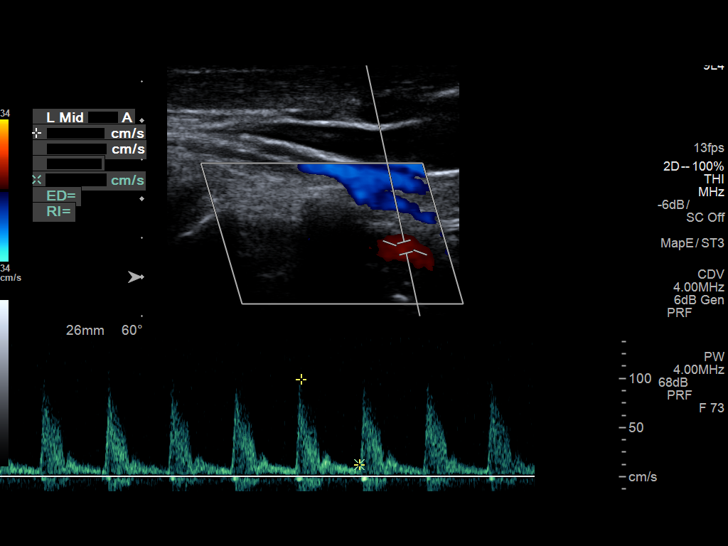

[13 of 24 positions shown; findings below may reference images not displayed]

FINDINGS: Criteria: Quantification of carotid stenosis is based on velocity
parameters that correlate the residual internal carotid diameter
with NASCET-based stenosis levels, using the diameter of the distal
internal carotid lumen as the denominator for stenosis measurement.

The following velocity measurements were obtained:

RIGHT

ICA:  100/19 cm/sec

CCA:  77/6 cm/sec

SYSTOLIC ICA/CCA RATIO:

ECA:  88 cm/sec

LEFT

ICA:  199/24 cm/sec

CCA:  94/9 cm/sec

SYSTOLIC ICA/CCA RATIO:

ECA:  145 cm/sec

RIGHT CAROTID ARTERY: Stable calcified plaque the level of the
distal common carotid artery, carotid bulb and right ICA origin.
Estimated right ICA stenosis remains less than 50%.

RIGHT VERTEBRAL ARTERY: Antegrade flow with normal waveform and
velocity.

LEFT CAROTID ARTERY: Stable moderate amount predominately calcified
plaque at the level of the carotid bulb and proximal left ICA.
Turbulent flow and elevated velocities present with estimated left
ICA stenosis of 50-69%.

LEFT VERTEBRAL ARTERY: Antegrade flow with normal waveform and
velocity.
IMPRESSION: Stable carotid duplex ultrasound with bilateral calcified plaque and
estimated right ICA stenosis of less than 50% and left ICA stenosis

## 2019-09-23 DIAGNOSIS — R300 Dysuria: Secondary | ICD-10-CM | POA: Diagnosis not present

## 2019-09-23 DIAGNOSIS — Z6823 Body mass index (BMI) 23.0-23.9, adult: Secondary | ICD-10-CM | POA: Diagnosis not present

## 2019-09-23 DIAGNOSIS — E1140 Type 2 diabetes mellitus with diabetic neuropathy, unspecified: Secondary | ICD-10-CM | POA: Diagnosis not present

## 2019-09-23 DIAGNOSIS — Z1389 Encounter for screening for other disorder: Secondary | ICD-10-CM | POA: Diagnosis not present

## 2019-09-23 DIAGNOSIS — E7849 Other hyperlipidemia: Secondary | ICD-10-CM | POA: Diagnosis not present

## 2019-09-23 DIAGNOSIS — I10 Essential (primary) hypertension: Secondary | ICD-10-CM | POA: Diagnosis not present

## 2019-09-23 DIAGNOSIS — G4700 Insomnia, unspecified: Secondary | ICD-10-CM | POA: Diagnosis not present

## 2019-10-09 DIAGNOSIS — E7849 Other hyperlipidemia: Secondary | ICD-10-CM | POA: Diagnosis not present

## 2019-10-09 DIAGNOSIS — I10 Essential (primary) hypertension: Secondary | ICD-10-CM | POA: Diagnosis not present

## 2019-10-11 DIAGNOSIS — G4701 Insomnia due to medical condition: Secondary | ICD-10-CM | POA: Diagnosis not present

## 2019-10-11 DIAGNOSIS — I2510 Atherosclerotic heart disease of native coronary artery without angina pectoris: Secondary | ICD-10-CM | POA: Diagnosis not present

## 2019-10-11 DIAGNOSIS — Z79899 Other long term (current) drug therapy: Secondary | ICD-10-CM | POA: Diagnosis not present

## 2019-11-04 ENCOUNTER — Encounter (HOSPITAL_COMMUNITY): Payer: Self-pay | Admitting: *Deleted

## 2019-11-04 ENCOUNTER — Other Ambulatory Visit: Payer: Self-pay

## 2019-11-04 ENCOUNTER — Emergency Department (HOSPITAL_COMMUNITY)
Admission: EM | Admit: 2019-11-04 | Discharge: 2019-11-04 | Disposition: A | Discharge status: Home / Self Care | Payer: Medicare Other | Attending: Emergency Medicine | Admitting: Emergency Medicine

## 2019-11-04 DIAGNOSIS — Z7902 Long term (current) use of antithrombotics/antiplatelets: Secondary | ICD-10-CM | POA: Diagnosis not present

## 2019-11-04 DIAGNOSIS — I2510 Atherosclerotic heart disease of native coronary artery without angina pectoris: Secondary | ICD-10-CM | POA: Insufficient documentation

## 2019-11-04 DIAGNOSIS — Z8673 Personal history of transient ischemic attack (TIA), and cerebral infarction without residual deficits: Secondary | ICD-10-CM | POA: Insufficient documentation

## 2019-11-04 DIAGNOSIS — Z79899 Other long term (current) drug therapy: Secondary | ICD-10-CM | POA: Insufficient documentation

## 2019-11-04 DIAGNOSIS — Z7982 Long term (current) use of aspirin: Secondary | ICD-10-CM | POA: Insufficient documentation

## 2019-11-04 DIAGNOSIS — R17 Unspecified jaundice: Secondary | ICD-10-CM | POA: Diagnosis not present

## 2019-11-04 DIAGNOSIS — Z87891 Personal history of nicotine dependence: Secondary | ICD-10-CM | POA: Diagnosis not present

## 2019-11-04 DIAGNOSIS — E119 Type 2 diabetes mellitus without complications: Secondary | ICD-10-CM | POA: Insufficient documentation

## 2019-11-04 DIAGNOSIS — R42 Dizziness and giddiness: Secondary | ICD-10-CM

## 2019-11-04 DIAGNOSIS — I10 Essential (primary) hypertension: Secondary | ICD-10-CM | POA: Insufficient documentation

## 2019-11-04 DIAGNOSIS — I252 Old myocardial infarction: Secondary | ICD-10-CM | POA: Insufficient documentation

## 2019-11-04 LAB — CBC WITH DIFFERENTIAL/PLATELET
Abs Immature Granulocytes: 0.05 10*3/uL (ref 0.00–0.07)
Basophils Absolute: 0 10*3/uL (ref 0.0–0.1)
Basophils Relative: 0 %
Eosinophils Absolute: 0.1 10*3/uL (ref 0.0–0.5)
Eosinophils Relative: 1 %
HCT: 37.2 % (ref 36.0–46.0)
Hemoglobin: 12 g/dL (ref 12.0–15.0)
Immature Granulocytes: 1 %
Lymphocytes Relative: 8 %
Lymphs Abs: 0.8 10*3/uL (ref 0.7–4.0)
MCH: 30.8 pg (ref 26.0–34.0)
MCHC: 32.3 g/dL (ref 30.0–36.0)
MCV: 95.4 fL (ref 80.0–100.0)
Monocytes Absolute: 1.1 10*3/uL — ABNORMAL HIGH (ref 0.1–1.0)
Monocytes Relative: 11 %
Neutro Abs: 7.7 10*3/uL (ref 1.7–7.7)
Neutrophils Relative %: 79 %
Platelets: 109 10*3/uL — ABNORMAL LOW (ref 150–400)
RBC: 3.9 MIL/uL (ref 3.87–5.11)
RDW: 14.3 % (ref 11.5–15.5)
WBC: 9.7 10*3/uL (ref 4.0–10.5)
nRBC: 0 % (ref 0.0–0.2)

## 2019-11-04 LAB — BASIC METABOLIC PANEL
Anion gap: 10 (ref 5–15)
BUN: 11 mg/dL (ref 8–23)
CO2: 29 mmol/L (ref 22–32)
Calcium: 8.5 mg/dL — ABNORMAL LOW (ref 8.9–10.3)
Chloride: 102 mmol/L (ref 98–111)
Creatinine, Ser: 0.87 mg/dL (ref 0.44–1.00)
GFR calc Af Amer: >60 mL/min (ref >60)
GFR calc non Af Amer: >60 mL/min (ref >60)
Glucose, Bld: 130 mg/dL — ABNORMAL HIGH (ref 70–99)
Potassium: 3 mmol/L — ABNORMAL LOW (ref 3.5–5.1)
Sodium: 141 mmol/L (ref 135–145)

## 2019-11-04 LAB — URINALYSIS, ROUTINE W REFLEX MICROSCOPIC
Bacteria, UA: NONE SEEN
Bilirubin Urine: NEGATIVE
Glucose, UA: NEGATIVE mg/dL
Ketones, ur: NEGATIVE mg/dL
Leukocytes,Ua: NEGATIVE
Nitrite: NEGATIVE
Protein, ur: NEGATIVE mg/dL
Specific Gravity, Urine: 1.008 (ref 1.005–1.030)
pH: 7 (ref 5.0–8.0)

## 2019-11-04 LAB — HEPATIC FUNCTION PANEL
ALT: 25 U/L (ref 0–44)
AST: 38 U/L (ref 15–41)
Albumin: 3.5 g/dL (ref 3.5–5.0)
Alkaline Phosphatase: 105 U/L (ref 38–126)
Bilirubin, Direct: 0.4 mg/dL — ABNORMAL HIGH (ref 0.0–0.2)
Indirect Bilirubin: 1.9 mg/dL — ABNORMAL HIGH (ref 0.3–0.9)
Total Bilirubin: 2.3 mg/dL — ABNORMAL HIGH (ref 0.3–1.2)
Total Protein: 6 g/dL — ABNORMAL LOW (ref 6.5–8.1)

## 2019-11-04 NOTE — Discharge Instructions (Addendum)
Make sure you are eating regular meals.  Stay well-hydrated with water.  Your urine should be clear to pale yellow. Your bilirubin was mildly elevated today. Follow-up with your primary care doctor for further evaluation.  Return to the ER with any new, worsening, or concerning symptoms.

## 2019-11-04 NOTE — ED Provider Notes (Signed)
[facility] EMERGENCY DEPARTMENT Provider Note   CSN: [id number] Arrival date & time: 11/04/19  1245     History Chief Complaint  Patient presents with  . Hypotension    [name] is a 79 y.o. female presenting for evaluation of dizziness.  Patient states just prior to arrival she was bending forward to pick something up when she stood up and had a few seconds of dizziness.  She is unable to describe how it feels, states no spinning, feeling off balance, or lightheadedness.  This lasted for several seconds before resolving without intervention.  She has felt fine since.  Patient states her granddaughter checked her blood pressure, was found to be hypotensive but does not know what the number was.  She denies current dizziness, headache, vision changes, fever, chills, chest pain, shortness breath, nausea, vomiting, abdominal pain, urinary symptoms, normal bowel movements.  She states she has not had anything to eat yet today.   Additional history obtained from chart review.  Patient with a history of anemia, aortic insufficiency, carotid stenosis, CAD, GERD, history of stroke, high cholesterol, diabetes  HPI     Past Medical History:  Diagnosis Date  . Anemia   . Aortic insufficiency    Moderate  . Back pain   . Carotid stenosis, bilateral   . Cirrhosis ([place])   . Closed left femoral fracture ([place]) 04/21/2017  . Coronary artery disease    Stent x 2 RCA 1995, cardiac catheterization 09/2016 showing only mild atherosclerosis  . DDD (degenerative disc disease), lumbar   . Essential hypertension   . Falls   . GERD (gastroesophageal reflux disease)   . Hiatal hernia   . History of stroke   . Hypercholesteremia   . IBS (irritable bowel syndrome)   . Non-alcoholic fatty liver disease   . OSA (obstructive sleep apnea)    no CPAP  . Pericardial effusion    a. small by echo 2018.  Marland Kitchen PSVT (paroxysmal supraventricular tachycardia) ([place])   . Recurrent UTI   . Seizures ([place])   .  Stroke ([place])   . Type 2 diabetes mellitus ([place])    off medication  . Varices, esophageal [facility])     Patient Active Problem List   Diagnosis Date Noted  . Tobacco use disorder 07/09/2018  . Small vessel disease ([place]) 07/09/2018  . Benign essential HTN   . Seizures ([place])   . Tachypnea   . Dysphagia, post-stroke   . Dysarthria 05/19/2018  . Syncope and collapse   . Syncope 08/24/2017  . Closed left femoral fracture ([place]) 04/21/2017  . Seizure disorder ([place]) 04/21/2017  . Closed pertrochanteric fracture of femur, left, initial encounter ([place]) 03/02/2017  . Seizure ([place]) 02/28/2017  . Hip fracture ([place]) 02/28/2017  . Diabetes mellitus without complication ([place]) 28/41/3244  . Epiretinal membrane (ERM) of left eye 01/20/2017  . Pseudophakia 01/20/2017  . Chest pain 12/07/2016  . Atypical chest pain 12/07/2016  . Tachycardia   . NSTEMI (non-ST elevated myocardial infarction) ([place]) 11/02/2016  . Multifocal atrial tachycardia ([place]) 09/25/2016  . Elevated troponin 09/24/2016  . Hypokalemia 09/23/2016  . TIA (transient ischemic attack) 05/05/2016  . Intractable nausea and vomiting 10/15/2015  . Acute coronary syndrome ([place]) 06/01/2015  . Bronchitis 06/01/2015  . Thrombocytopenia ([place]) 06/01/2015  . Cerebral infarction ([place]) right occipital due to small vessel disease s/p tPA 03/06/2014  . Lower urinary tract infectious disease   . PSVT (paroxysmal supraventricular tachycardia) ([place]) 12/09/2013  . Esophageal varices ([place]) 03/29/2013  .  Hematemesis 03/29/2013  . Hepatic cirrhosis ([place]) 03/29/2013  . Presence of stent in right coronary artery 07/04/2011  . OSA (obstructive sleep apnea) 07/04/2011  . Aortic sclerosis 07/04/2011  . Aortic insufficiency 07/04/2011  . HTN (hypertension) 07/04/2011  . Carotid stenosis, bilateral 07/04/2011  . Chest pain at rest 07/03/2011  . CAD (coronary artery disease) 07/03/2011  . Hypercholesteremia 07/03/2011  . GERD (gastroesophageal reflux  disease) 07/03/2011    Past Surgical History:  Procedure Laterality Date  . [place]  . APPENDECTOMY    . BACK SURGERY  1985  . CARDIAC CATHETERIZATION  [phone]   Stent to the proximal RCA after MI   . CARDIAC CATHETERIZATION N/A 09/26/2016   Procedure: Left Heart Cath and Coronary Angiography;  Surgeon: Leonie Man, MD;  Location: [place] [facility];  Service: Cardiovascular;  Laterality: N/A;  . CATARACT EXTRACTION W/PHACO Right 07/04/2014   Procedure: CATARACT EXTRACTION PHACO AND INTRAOCULAR LENS PLACEMENT (IOC);  Surgeon: Elta Guadeloupe T. Gershon Crane, MD;  Location: [facility];  Service: Ophthalmology;  Laterality: Right;  CDE:13.13  . COLONOSCOPY    . DILATION AND CURETTAGE OF UTERUS     x2  . Epi Retinal Membrane Peel Left   . ERCP    . ESOPHAGEAL BANDING N/A 04/01/2013   Procedure: ESOPHAGEAL BANDING;  Surgeon: Rogene Houston, MD;  Location: [facility];  Service: Endoscopy;  Laterality: N/A;  . ESOPHAGEAL BANDING N/A 05/24/2013   Procedure: ESOPHAGEAL BANDING;  Surgeon: Rogene Houston, MD;  [facility];  Service: Endoscopy;  Laterality: N/A;  . ESOPHAGEAL BANDING N/A 06/21/2014   Procedure: ESOPHAGEAL BANDING;  Surgeon: Rogene Houston, MD;  [facility];  Service: Endoscopy;  Laterality: N/A;  . ESOPHAGOGASTRODUODENOSCOPY N/A 04/01/2013   Procedure: ESOPHAGOGASTRODUODENOSCOPY (EGD);  Surgeon: Rogene Houston, MD;  [facility];  Service: Endoscopy;  Laterality: N/A;  230-rescheduled to 8:30am Ann notified pt  . ESOPHAGOGASTRODUODENOSCOPY N/A 05/24/2013   Procedure: ESOPHAGOGASTRODUODENOSCOPY (EGD);  Surgeon: Rogene Houston, MD;  [facility];  Service: Endoscopy;  Laterality: N/A;  730  . ESOPHAGOGASTRODUODENOSCOPY N/A 06/21/2014   Procedure: ESOPHAGOGASTRODUODENOSCOPY (EGD);  Surgeon: Rogene Houston, MD;  [facility];  Service: Endoscopy;  Laterality: N/A;  930-rescheduled 10/14 @ 1200 Ann to notify pt  .  EYE SURGERY  08   cataract surgery of the left eye  . FEMUR IM NAIL Left 03/02/2017   Procedure: INTRAMEDULLARY (IM) NAIL FEMORAL;  Surgeon: Rod Can, MD;  Location: [place];  Service: Orthopedics;  Laterality: Left;  . HARDWARE REMOVAL Right 01/17/2013   Procedure: REMOVAL OF HARDWARE AND EXCISION ULNAR STYLOID RIGHT WRIST;  Surgeon: Tennis Must, MD;  Location: [place];  Service: Orthopedics;  Laterality: Right;  . MYRINGOTOMY  2012   both ears  . ORIF FEMUR FRACTURE Left 04/23/2017   Procedure: OPEN REDUCTION INTERNAL FIXATION (ORIF) DISTAL FEMUR FRACTURE (FRACTURE AROUND FEMORAL NAIL);  Surgeon: Altamese [place], MD;  Location: [place];  Service: Orthopedics;  Laterality: Left;  . TONSILLECTOMY    . VAGINAL HYSTERECTOMY  1972  . WRIST SURGERY     rt wrist hardwear removal     OB History   No obstetric history on file.     Family History  Problem Relation Age of Onset  . Diabetes Mother   . Heart failure Father   . Heart failure Maternal Aunt     Social History   Tobacco Use  .  Smoking status: Former Smoker    Packs/day: 0.25    Years: 50.00    Pack years: 12.50    Types: Cigarettes    Quit date: 11/03/2017    Years since quitting: 2.0  . Smokeless tobacco: Never Used  . Tobacco comment: 1 pk lasts a week.   Substance Use Topics  . Alcohol use: No    Alcohol/week: 0.0 standard drinks  . Drug use: No    Home Medications Prior to Admission medications   Medication Sig Start Date End Date Taking? Authorizing Provider  acetaminophen (TYLENOL) 325 MG tablet Take 2 tablets (650 mg total) by mouth every 6 (six) hours as needed for headache. 07/15/18   Angiulli, Lavon Paganini, PA-C  aspirin EC 81 MG tablet Take 81 mg by mouth daily.    Jake Samples, PA-C  atorvastatin (LIPITOR) 80 MG tablet Take 1 tablet (80 mg total) by mouth daily. 07/15/18   Angiulli, Lavon Paganini, PA-C  calcium citrate (CALCITRATE - DOSED IN MG ELEMENTAL CALCIUM) 950 MG tablet Take 1  tablet (200 mg of elemental calcium total) by mouth daily. 07/15/18   Angiulli, Lavon Paganini, PA-C  carvedilol (COREG) 12.5 MG tablet Take 1 tablet (12.5 mg total) by mouth 2 (two) times daily with a meal. 07/15/18   Angiulli, Lavon Paganini, PA-C  clopidogrel (PLAVIX) 75 MG tablet Take 1 tablet (75 mg total) by mouth daily with breakfast. 07/15/18   Angiulli, Lavon Paganini, PA-C  docusate sodium (COLACE) 100 MG capsule Take 100 mg by mouth 3 (three) times daily.    [provider]  Furosemide (LASIX PO) Take by mouth as needed.    [provider]  HYDROcodone-acetaminophen (NORCO) 10-325 MG tablet Take 1 tablet by mouth every 6 (six) hours as needed for severe pain.    [provider]  methocarbamol (ROBAXIN) 500 MG tablet Take 500 mg by mouth 4 (four) times daily.    [provider]  Multiple Vitamins-Minerals (CENTRUM ADULTS) TABS Take 1 tablet by mouth daily.    [provider]  niacin (NIASPAN) 500 MG CR tablet Take 1 tablet by mouth daily. 06/14/18   [provider]  omeprazole (PRILOSEC) 20 MG capsule Take 1 capsule (20 mg total) by mouth daily. 03/05/18   Setzer, Terri L, NP  ondansetron (ZOFRAN) 4 MG tablet TAKE (1) TABLET BY MOUTH EVERY 8 HOURS AS NEEDED. Patient not taking: Reported on 03/16/2019 09/21/18   Butch Penny, NP  ranitidine (ZANTAC) 150 MG tablet Take 1 tablet by mouth 2 (two) times daily as needed. 03/19/18   [provider]  traZODone (DESYREL) 50 MG tablet  11/04/18   [provider]  zolpidem (AMBIEN) 10 MG tablet Take 10 mg by mouth at bedtime as needed for sleep.    [provider]    Allergies    Tape  Review of Systems   Review of Systems  Neurological: Positive for dizziness (resolved).  All other systems reviewed and are negative.   Physical Exam Updated Vital Signs BP (!) 126/40   Pulse 74   Temp (!) 97 F (36.1 C) (Temporal)   Resp 18   Ht 5' 3"  (1.6 m)   Wt 59.4 kg   SpO2 97%   BMI 23.21  kg/m   Physical Exam Vitals and nursing note reviewed.  Constitutional:      General: She is not in acute distress.    Appearance: She is well-developed.     Comments: Resting comfortably in the  bed in no acute distress  HENT:     Head: Normocephalic and atraumatic.  Eyes:     Extraocular Movements: Extraocular movements intact.     Conjunctiva/sclera: Conjunctivae normal.     Pupils: Pupils are equal, round, and reactive to light.     Comments: EOMI and PERRLA.  No nystagmus.  Cardiovascular:     Rate and Rhythm: Normal rate and regular rhythm.     Pulses: Normal pulses.     Heart sounds: Murmur present.  Pulmonary:     Effort: Pulmonary effort is normal. No respiratory distress.     Breath sounds: Normal breath sounds. No wheezing.  Abdominal:     General: There is no distension.     Palpations: Abdomen is soft. There is no mass.     Tenderness: There is no abdominal tenderness. There is no guarding or rebound.  Musculoskeletal:        General: Normal range of motion.     Cervical back: Normal range of motion and neck supple.     Comments: Strength and sensation intact x4.  Skin:    General: Skin is warm and dry.     Capillary Refill: Capillary refill takes less than 2 seconds.  Neurological:     General: No focal deficit present.     Mental Status: She is alert and oriented to person, place, and time.     GCS: GCS eye subscore is 4. GCS verbal subscore is 5. GCS motor subscore is 6.     Sensory: Sensation is intact.     Motor: Motor function is intact.     ED Results / Procedures / Treatments   Labs (all labs ordered are listed, but only abnormal results are displayed) Labs Reviewed  CBC WITH DIFFERENTIAL/PLATELET - Abnormal; Notable for the following components:      Result Value   Platelets 109 (*)    Monocytes Absolute 1.1 (*)    All other components within normal limits  BASIC METABOLIC PANEL - Abnormal; Notable for the following components:   Potassium 3.0  (*)    Glucose, Bld 130 (*)    Calcium 8.5 (*)    All other components within normal limits  HEPATIC FUNCTION PANEL - Abnormal; Notable for the following components:   Total Protein 6.0 (*)    Total Bilirubin 2.3 (*)    Bilirubin, Direct 0.4 (*)    Indirect Bilirubin 1.9 (*)    All other components within normal limits  URINALYSIS, ROUTINE W REFLEX MICROSCOPIC - Abnormal; Notable for the following components:   Hgb urine dipstick SMALL (*)    All other components within normal limits    EKG EKG Interpretation  Date/Time:  Friday November 04 2019 13:07:02 EST Ventricular Rate:  61 PR Interval:    QRS Duration: 106 QT Interval:  447 QTC Calculation: 451 R Axis:   -32 Text Interpretation: Sinus rhythm Ventricular premature complex Borderline prolonged PR interval Left axis deviation Probable anteroseptal infarct, old Minimal ST depression, lateral leads Baseline wander in lead(s) V4 Since last tracing pvc is new Otherwise no significant change Confirmed by Daleen Bo [phone]) on 11/04/2019 3:01:03 PM   Radiology No results found.  Procedures Procedures (including critical care time)  Medications Ordered in ED Medications - No data to display  ED Course  I have reviewed the triage vital signs and the nursing notes.  Pertinent labs & imaging results that were available during my care of the patient were reviewed by me and  considered in my medical decision making (see chart for details).    MDM Rules/Calculators/A&P                      Patient presenting for evaluation of dizziness.  Physical examination, she appears nontoxic.  No obvious neurologic deficits at this time.  Symptoms have completely resolved.  Symptoms are present when she went from bending forward to standing.  Likely positional/orthostatic.  Also, patient has not had anything to eat today, consider hypoglycemia.  However considering her history and age, will obtain labs, EKG, and urine.  Urine without  infection.  EKG similar to previous.  Labs interpreted by me, overall reassuring.  Mild elevation in bilirubin when compared to previous.  Patient without fever or abdominal pain, doubt cholecystitis.  Discussed further outpatient evaluation with her primary care doctor.  Case discussed with attending, Dr. Eulis Foster evaluated the patient.  Patient states she is feeling well and would like to leave. orthostatics negative, no hypotension in the ED.  At this time, there is no obvious emergent life-threatening condition.  Discussed importance of eating regular meals.  At this time, patient appears safe for discharge.  Return precautions given.  Patient states she understands and agrees to plan.  Final Clinical Impression(s) / ED Diagnoses Final diagnoses:  Dizziness  Elevated bilirubin    Rx / DC Orders ED Discharge Orders    None       Franchot Heidelberg, PA-C 11/04/19 1709    Noemi Chapel, MD 11/04/19 2051

## 2019-11-04 NOTE — ED Triage Notes (Signed)
Pt with dizziness, denies at present.  States granddaughter checked her bp and was low, pt unable to recall the bp.

## 2019-11-04 NOTE — ED Notes (Signed)
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

## 2019-11-04 NOTE — ED Provider Notes (Signed)
  Face-to-face evaluation   History: She is here for evaluation of presyncope, that occurred when she was standing up from leaning over.  She describes a mild spinning sensation, that was "transient."  She did not go to floor or pass out.  Physical exam: Alert elderly female who is calm comfortable.  No ataxia.  No pronator drift.  No nystagmus.  No dysarthria or aphasia.  Medical screening examination/treatment/procedure(s) were conducted as a shared visit with non-physician practitioner(s) and myself.  I personally evaluated the patient during the encounter    Daleen Bo, MD 11/07/19 1512

## 2019-11-04 NOTE — ED Notes (Signed)
Orthostatic VS  Lying: 0/10             82,80,034/91, 96 per cent RA  Sitting 0/10              64,16,138/43, 98 per cent RA  Standing 0/10              67,18, 139/41, 96 per cent RA

## 2019-11-06 DIAGNOSIS — I10 Essential (primary) hypertension: Secondary | ICD-10-CM | POA: Diagnosis not present

## 2019-11-06 DIAGNOSIS — E7849 Other hyperlipidemia: Secondary | ICD-10-CM | POA: Diagnosis not present

## 2019-11-24 DIAGNOSIS — R260 Ataxic gait: Secondary | ICD-10-CM | POA: Diagnosis not present

## 2019-11-24 DIAGNOSIS — G4452 New daily persistent headache (NDPH): Secondary | ICD-10-CM | POA: Diagnosis not present

## 2019-11-24 DIAGNOSIS — G5631 Lesion of radial nerve, right upper limb: Secondary | ICD-10-CM | POA: Diagnosis not present

## 2019-11-24 DIAGNOSIS — G8190 Hemiplegia, unspecified affecting unspecified side: Secondary | ICD-10-CM | POA: Diagnosis not present

## 2019-11-29 ENCOUNTER — Other Ambulatory Visit (HOSPITAL_COMMUNITY): Payer: Self-pay | Admitting: Neurology

## 2019-11-29 ENCOUNTER — Other Ambulatory Visit: Payer: Self-pay | Admitting: Neurology

## 2019-11-29 DIAGNOSIS — G8312 Monoplegia of lower limb affecting left dominant side: Secondary | ICD-10-CM

## 2019-12-07 DIAGNOSIS — E7849 Other hyperlipidemia: Secondary | ICD-10-CM | POA: Diagnosis not present

## 2019-12-07 DIAGNOSIS — I10 Essential (primary) hypertension: Secondary | ICD-10-CM | POA: Diagnosis not present

## 2020-01-03 ENCOUNTER — Emergency Department (HOSPITAL_COMMUNITY): Payer: Medicare Other

## 2020-01-03 ENCOUNTER — Other Ambulatory Visit: Payer: Self-pay

## 2020-01-03 ENCOUNTER — Inpatient Hospital Stay (HOSPITAL_COMMUNITY)
Admission: EM | Admit: 2020-01-03 | Discharge: 2020-01-05 | DRG: 065 | Disposition: A | Discharge status: Home Health | Payer: Medicare Other | Attending: Family Medicine | Admitting: Family Medicine

## 2020-01-03 ENCOUNTER — Ambulatory Visit (HOSPITAL_COMMUNITY)
Admission: RE | Admit: 2020-01-03 | Discharge: 2020-01-03 | Disposition: A | Discharge status: Home / Self Care | Payer: Medicare Other | Source: Ambulatory Visit | Attending: Neurology | Admitting: Neurology

## 2020-01-03 ENCOUNTER — Encounter (HOSPITAL_COMMUNITY): Payer: Self-pay

## 2020-01-03 DIAGNOSIS — Z7982 Long term (current) use of aspirin: Secondary | ICD-10-CM

## 2020-01-03 DIAGNOSIS — G4733 Obstructive sleep apnea (adult) (pediatric): Secondary | ICD-10-CM | POA: Diagnosis not present

## 2020-01-03 DIAGNOSIS — I471 Supraventricular tachycardia, unspecified: Secondary | ICD-10-CM | POA: Diagnosis present

## 2020-01-03 DIAGNOSIS — E876 Hypokalemia: Secondary | ICD-10-CM | POA: Diagnosis not present

## 2020-01-03 DIAGNOSIS — K589 Irritable bowel syndrome without diarrhea: Secondary | ICD-10-CM | POA: Diagnosis present

## 2020-01-03 DIAGNOSIS — G8312 Monoplegia of lower limb affecting left dominant side: Secondary | ICD-10-CM | POA: Insufficient documentation

## 2020-01-03 DIAGNOSIS — E119 Type 2 diabetes mellitus without complications: Secondary | ICD-10-CM

## 2020-01-03 DIAGNOSIS — G40909 Epilepsy, unspecified, not intractable, without status epilepticus: Secondary | ICD-10-CM | POA: Diagnosis present

## 2020-01-03 DIAGNOSIS — Z833 Family history of diabetes mellitus: Secondary | ICD-10-CM

## 2020-01-03 DIAGNOSIS — K760 Fatty (change of) liver, not elsewhere classified: Secondary | ICD-10-CM | POA: Diagnosis present

## 2020-01-03 DIAGNOSIS — I10 Essential (primary) hypertension: Secondary | ICD-10-CM | POA: Diagnosis not present

## 2020-01-03 DIAGNOSIS — K7460 Unspecified cirrhosis of liver: Secondary | ICD-10-CM | POA: Diagnosis not present

## 2020-01-03 DIAGNOSIS — I69354 Hemiplegia and hemiparesis following cerebral infarction affecting left non-dominant side: Secondary | ICD-10-CM | POA: Diagnosis not present

## 2020-01-03 DIAGNOSIS — I63233 Cerebral infarction due to unspecified occlusion or stenosis of bilateral carotid arteries: Secondary | ICD-10-CM | POA: Diagnosis not present

## 2020-01-03 DIAGNOSIS — Z7902 Long term (current) use of antithrombotics/antiplatelets: Secondary | ICD-10-CM

## 2020-01-03 DIAGNOSIS — G8191 Hemiplegia, unspecified affecting right dominant side: Secondary | ICD-10-CM | POA: Diagnosis not present

## 2020-01-03 DIAGNOSIS — I340 Nonrheumatic mitral (valve) insufficiency: Secondary | ICD-10-CM | POA: Diagnosis not present

## 2020-01-03 DIAGNOSIS — R29704 NIHSS score 4: Secondary | ICD-10-CM | POA: Diagnosis present

## 2020-01-03 DIAGNOSIS — Z955 Presence of coronary angioplasty implant and graft: Secondary | ICD-10-CM

## 2020-01-03 DIAGNOSIS — E7800 Pure hypercholesterolemia, unspecified: Secondary | ICD-10-CM | POA: Diagnosis not present

## 2020-01-03 DIAGNOSIS — I6523 Occlusion and stenosis of bilateral carotid arteries: Secondary | ICD-10-CM | POA: Diagnosis present

## 2020-01-03 DIAGNOSIS — I252 Old myocardial infarction: Secondary | ICD-10-CM | POA: Diagnosis not present

## 2020-01-03 DIAGNOSIS — I350 Nonrheumatic aortic (valve) stenosis: Secondary | ICD-10-CM | POA: Diagnosis not present

## 2020-01-03 DIAGNOSIS — R2689 Other abnormalities of gait and mobility: Secondary | ICD-10-CM | POA: Diagnosis not present

## 2020-01-03 DIAGNOSIS — I8510 Secondary esophageal varices without bleeding: Secondary | ICD-10-CM | POA: Diagnosis present

## 2020-01-03 DIAGNOSIS — Z79899 Other long term (current) drug therapy: Secondary | ICD-10-CM | POA: Diagnosis not present

## 2020-01-03 DIAGNOSIS — R531 Weakness: Secondary | ICD-10-CM | POA: Diagnosis not present

## 2020-01-03 DIAGNOSIS — Z9841 Cataract extraction status, right eye: Secondary | ICD-10-CM

## 2020-01-03 DIAGNOSIS — I639 Cerebral infarction, unspecified: Principal | ICD-10-CM | POA: Diagnosis present

## 2020-01-03 DIAGNOSIS — E7849 Other hyperlipidemia: Secondary | ICD-10-CM | POA: Diagnosis not present

## 2020-01-03 DIAGNOSIS — I351 Nonrheumatic aortic (valve) insufficiency: Secondary | ICD-10-CM | POA: Diagnosis present

## 2020-01-03 DIAGNOSIS — I2510 Atherosclerotic heart disease of native coronary artery without angina pectoris: Secondary | ICD-10-CM | POA: Diagnosis present

## 2020-01-03 DIAGNOSIS — Z87891 Personal history of nicotine dependence: Secondary | ICD-10-CM

## 2020-01-03 DIAGNOSIS — Z20822 Contact with and (suspected) exposure to covid-19: Secondary | ICD-10-CM | POA: Diagnosis present

## 2020-01-03 DIAGNOSIS — K219 Gastro-esophageal reflux disease without esophagitis: Secondary | ICD-10-CM | POA: Diagnosis present

## 2020-01-03 DIAGNOSIS — Z961 Presence of intraocular lens: Secondary | ICD-10-CM | POA: Diagnosis not present

## 2020-01-03 DIAGNOSIS — Z8249 Family history of ischemic heart disease and other diseases of the circulatory system: Secondary | ICD-10-CM

## 2020-01-03 LAB — COMPREHENSIVE METABOLIC PANEL
ALT: 27 U/L (ref 0–44)
AST: 48 U/L — ABNORMAL HIGH (ref 15–41)
Albumin: 3.7 g/dL (ref 3.5–5.0)
Alkaline Phosphatase: 122 U/L (ref 38–126)
Anion gap: 10 (ref 5–15)
BUN: 10 mg/dL (ref 8–23)
CO2: 29 mmol/L (ref 22–32)
Calcium: 8.8 mg/dL — ABNORMAL LOW (ref 8.9–10.3)
Chloride: 107 mmol/L (ref 98–111)
Creatinine, Ser: 0.93 mg/dL (ref 0.44–1.00)
GFR calc Af Amer: >60 mL/min (ref >60)
GFR calc non Af Amer: 59 mL/min — ABNORMAL LOW (ref >60)
Glucose, Bld: 94 mg/dL (ref 70–99)
Potassium: 3.4 mmol/L — ABNORMAL LOW (ref 3.5–5.1)
Sodium: 146 mmol/L — ABNORMAL HIGH (ref 135–145)
Total Bilirubin: 1.1 mg/dL (ref 0.3–1.2)
Total Protein: 6.9 g/dL (ref 6.5–8.1)

## 2020-01-03 LAB — SARS CORONAVIRUS 2 (TAT 6-24 HRS): SARS Coronavirus 2: NEGATIVE

## 2020-01-03 LAB — CBC
HCT: 38.4 % (ref 36.0–46.0)
Hemoglobin: 12.3 g/dL (ref 12.0–15.0)
MCH: 30.3 pg (ref 26.0–34.0)
MCHC: 32 g/dL (ref 30.0–36.0)
MCV: 94.6 fL (ref 80.0–100.0)
Platelets: 142 10*3/uL — ABNORMAL LOW (ref 150–400)
RBC: 4.06 MIL/uL (ref 3.87–5.11)
RDW: 14.9 % (ref 11.5–15.5)
WBC: 6.1 10*3/uL (ref 4.0–10.5)
nRBC: 0 % (ref 0.0–0.2)

## 2020-01-03 LAB — LIPID PANEL
Cholesterol: 162 mg/dL (ref 0–200)
HDL: 63 mg/dL (ref >40)
LDL Cholesterol: 83 mg/dL (ref 0–99)
Total CHOL/HDL Ratio: 2.6 RATIO
Triglycerides: 78 mg/dL (ref <150)
VLDL: 16 mg/dL (ref 0–40)

## 2020-01-03 LAB — HEMOGLOBIN A1C
Hgb A1c MFr Bld: 5.2 % (ref 4.8–5.6)
Mean Plasma Glucose: 102.54 mg/dL

## 2020-01-03 LAB — TSH: TSH: 0.504 u[IU]/mL (ref 0.350–4.500)

## 2020-01-03 MED ORDER — POLYETHYLENE GLYCOL 3350 17 G PO PACK: 17.0000 g | PACK | Freq: Every day | ORAL | Status: DC | PRN

## 2020-01-03 MED ORDER — CALCIUM CITRATE 950 (200 CA) MG PO TABS: 1.0000 | ORAL_TABLET | Freq: Every day | ORAL | Status: DC

## 2020-01-03 MED ORDER — PANTOPRAZOLE SODIUM 40 MG PO TBEC
40.0000 mg | DELAYED_RELEASE_TABLET | Freq: Every day | ORAL | Status: DC
  Administered 2020-01-04 – 2020-01-05 (×2): 40 mg via ORAL
  Filled 2020-01-03 (×2): qty 1

## 2020-01-03 MED ORDER — CARVEDILOL 12.5 MG PO TABS: 12.5000 mg | ORAL_TABLET | Freq: Two times a day (BID) | ORAL | Status: DC

## 2020-01-03 MED ORDER — OXYMETAZOLINE HCL 0.05 % NA SOLN: 1.0000 | Freq: Two times a day (BID) | NASAL | Status: DC | PRN

## 2020-01-03 MED ORDER — SODIUM CHLORIDE 0.9% FLUSH
3.0000 mL | Freq: Two times a day (BID) | INTRAVENOUS | Status: DC
  Administered 2020-01-03 – 2020-01-05 (×4): 3 mL via INTRAVENOUS

## 2020-01-03 MED ORDER — SODIUM CHLORIDE 0.9% FLUSH: 3.0000 mL | INTRAVENOUS | Status: DC | PRN

## 2020-01-03 MED ORDER — SODIUM CHLORIDE 0.9 % IV SOLN: 250.0000 mL | INTRAVENOUS | Status: DC | PRN

## 2020-01-03 MED ORDER — ONDANSETRON HCL 4 MG/2ML IJ SOLN: 4.0000 mg | Freq: Four times a day (QID) | INTRAMUSCULAR | Status: DC | PRN

## 2020-01-03 MED ORDER — ONDANSETRON HCL 4 MG PO TABS: 4.0000 mg | ORAL_TABLET | Freq: Four times a day (QID) | ORAL | Status: DC | PRN

## 2020-01-03 MED ORDER — ATORVASTATIN CALCIUM 40 MG PO TABS
80.0000 mg | ORAL_TABLET | Freq: Every day | ORAL | Status: DC
  Administered 2020-01-04 – 2020-01-05 (×2): 80 mg via ORAL
  Filled 2020-01-03 (×2): qty 2

## 2020-01-03 MED ORDER — DILTIAZEM HCL ER BEADS 120 MG PO CP24: 120.0000 mg | ORAL_CAPSULE | Freq: Every day | ORAL | Status: DC

## 2020-01-03 MED ORDER — CALCIUM CARBONATE 1250 (500 CA) MG PO TABS
1.0000 | ORAL_TABLET | Freq: Every day | ORAL | Status: DC
  Administered 2020-01-04 – 2020-01-05 (×2): 500 mg via ORAL
  Filled 2020-01-03 (×2): qty 1

## 2020-01-03 MED ORDER — DILTIAZEM HCL ER COATED BEADS 120 MG PO CP24
120.0000 mg | ORAL_CAPSULE | Freq: Every day | ORAL | Status: DC
  Administered 2020-01-04 – 2020-01-05 (×2): 120 mg via ORAL
  Filled 2020-01-03 (×2): qty 1

## 2020-01-03 MED ORDER — HEPARIN SODIUM (PORCINE) 5000 UNIT/ML IJ SOLN
5000.0000 [IU] | Freq: Three times a day (TID) | INTRAMUSCULAR | Status: DC
  Administered 2020-01-03 – 2020-01-05 (×5): 5000 [IU] via SUBCUTANEOUS
  Filled 2020-01-03 (×5): qty 1

## 2020-01-03 MED ORDER — METOPROLOL SUCCINATE ER 25 MG PO TB24
25.0000 mg | ORAL_TABLET | Freq: Every day | ORAL | Status: DC
  Administered 2020-01-04 – 2020-01-05 (×2): 25 mg via ORAL
  Filled 2020-01-03 (×2): qty 1

## 2020-01-03 MED ORDER — DOCUSATE SODIUM 100 MG PO CAPS
100.0000 mg | ORAL_CAPSULE | Freq: Every day | ORAL | Status: DC
  Administered 2020-01-04 – 2020-01-05 (×2): 100 mg via ORAL
  Filled 2020-01-03 (×2): qty 1

## 2020-01-03 MED ORDER — NIACIN ER 250 MG PO CPCR
500.0000 mg | ORAL_CAPSULE | Freq: Every day | ORAL | Status: DC
  Administered 2020-01-04 – 2020-01-05 (×2): 500 mg via ORAL
  Filled 2020-01-03 (×3): qty 2

## 2020-01-03 MED ORDER — ADULT MULTIVITAMIN W/MINERALS CH
1.0000 | ORAL_TABLET | Freq: Every day | ORAL | Status: DC
  Administered 2020-01-04 – 2020-01-05 (×2): 1 via ORAL
  Filled 2020-01-03 (×2): qty 1

## 2020-01-03 MED ORDER — IOHEXOL 350 MG/ML SOLN
100.0000 mL | Freq: Once | INTRAVENOUS | Status: AC | PRN
  Administered 2020-01-03: 14:00:00 75 mL via INTRAVENOUS

## 2020-01-03 MED ORDER — FUROSEMIDE 20 MG PO TABS
20.0000 mg | ORAL_TABLET | Freq: Every day | ORAL | Status: DC
  Administered 2020-01-04 – 2020-01-05 (×2): 20 mg via ORAL
  Filled 2020-01-03 (×2): qty 1

## 2020-01-03 MED ORDER — ASPIRIN EC 81 MG PO TBEC
81.0000 mg | DELAYED_RELEASE_TABLET | Freq: Every day | ORAL | Status: DC
  Administered 2020-01-04 – 2020-01-05 (×2): 81 mg via ORAL
  Filled 2020-01-03 (×2): qty 1

## 2020-01-03 MED ORDER — TRAZODONE HCL 50 MG PO TABS
50.0000 mg | ORAL_TABLET | Freq: Every evening | ORAL | Status: DC | PRN
  Administered 2020-01-03 – 2020-01-05 (×2): 50 mg via ORAL
  Filled 2020-01-03 (×2): qty 1

## 2020-01-03 MED ORDER — CLOPIDOGREL BISULFATE 75 MG PO TABS
75.0000 mg | ORAL_TABLET | Freq: Every day | ORAL | Status: DC
  Administered 2020-01-04 – 2020-01-05 (×2): 75 mg via ORAL
  Filled 2020-01-03 (×2): qty 1

## 2020-01-03 NOTE — ED Provider Notes (Signed)
[facility] EMERGENCY DEPARTMENT Provider Note   CSN: [id number] Arrival date & time: 01/03/20  1154     History Chief Complaint  Patient presents with  . Abnormal Lab    Catrice Zuleta is a 79 y.o. female.  Patient is a 79 year old female with past medical history significant for CVA in 2020, coronary artery disease, aortic sclerosis, carotid stenosis, hypertension presenting for acute CVA.  Patient reports that she had a scheduled MRI today, was scheduled 6 months ago.  During the MRI it was noted that patient had findings of few small acute to subacute infarcts involving multiple vascular territories.  Given these findings patient was wheeled to the emergency department.  Patient reports she has had no neurological symptoms.  Denies any chest pain, edema, weakness, dizziness, headache.  Did have a fall 2 weeks ago but this was because she tripped and fell. Patient states she otherwise has been at baseline.  Lives with her grandson and granddaughter who help her with ADLs.  Can ambulate a few steps but mainly uses wheelchair.  Is able to do other ADLs independently.          Past Medical History:  Diagnosis Date  . Anemia   . Aortic insufficiency    Moderate  . Back pain   . Carotid stenosis, bilateral   . Cirrhosis ([place])   . Closed left femoral fracture ([place]) 04/21/2017  . Coronary artery disease    Stent x 2 RCA 1995, cardiac catheterization 09/2016 showing only mild atherosclerosis  . DDD (degenerative disc disease), lumbar   . Essential hypertension   . Falls   . GERD (gastroesophageal reflux disease)   . Hiatal hernia   . History of stroke   . Hypercholesteremia   . IBS (irritable bowel syndrome)   . Non-alcoholic fatty liver disease   . OSA (obstructive sleep apnea)    no CPAP  . Pericardial effusion    a. small by echo 2018.  Marland Kitchen PSVT (paroxysmal supraventricular tachycardia) ([place])   . Recurrent UTI   . Seizures ([place])   . Stroke ([place])   . Type 2 diabetes  mellitus ([place])    off medication  . Varices, esophageal [facility])     Patient Active Problem List   Diagnosis Date Noted  . Acute CVA (cerebrovascular accident) ([place]) 01/03/2020  . Tobacco use disorder 07/09/2018  . Small vessel disease ([place]) 07/09/2018  . Benign essential HTN   . Seizures ([place])   . Tachypnea   . Dysphagia, post-stroke   . Dysarthria 05/19/2018  . Syncope and collapse   . Syncope 08/24/2017  . Closed left femoral fracture ([place]) 04/21/2017  . Seizure disorder ([place]) 04/21/2017  . Closed pertrochanteric fracture of femur, left, initial encounter ([place]) 03/02/2017  . Seizure ([place]) 02/28/2017  . Hip fracture ([place]) 02/28/2017  . Diabetes mellitus without complication ([place]) 78/29/5621  . Epiretinal membrane (ERM) of left eye 01/20/2017  . Pseudophakia 01/20/2017  . Chest pain 12/07/2016  . Atypical chest pain 12/07/2016  . Tachycardia   . NSTEMI (non-ST elevated myocardial infarction) ([place]) 11/02/2016  . Multifocal atrial tachycardia ([place]) 09/25/2016  . Elevated troponin 09/24/2016  . Hypokalemia 09/23/2016  . TIA (transient ischemic attack) 05/05/2016  . Intractable nausea and vomiting 10/15/2015  . Acute coronary syndrome ([place]) 06/01/2015  . Bronchitis 06/01/2015  . Thrombocytopenia ([place]) 06/01/2015  . Cerebral infarction ([place]) right occipital due to small vessel disease s/p tPA 03/06/2014  . Lower urinary tract infectious disease   . PSVT (paroxysmal  supraventricular tachycardia) ([place]) 12/09/2013  . Esophageal varices ([place]) 03/29/2013  . Hematemesis 03/29/2013  . Hepatic cirrhosis ([place]) 03/29/2013  . Presence of stent in right coronary artery 07/04/2011  . OSA (obstructive sleep apnea) 07/04/2011  . Aortic sclerosis 07/04/2011  . Aortic insufficiency 07/04/2011  . HTN (hypertension) 07/04/2011  . Carotid stenosis, bilateral 07/04/2011  . Chest pain at rest 07/03/2011  . CAD (coronary artery disease) 07/03/2011  . Hypercholesteremia 07/03/2011  . GERD  (gastroesophageal reflux disease) 07/03/2011    Past Surgical History:  Procedure Laterality Date  . [place]  . APPENDECTOMY    . BACK SURGERY  1985  . CARDIAC CATHETERIZATION  [phone]   Stent to the proximal RCA after MI   . CARDIAC CATHETERIZATION N/A 09/26/2016   Procedure: Left Heart Cath and Coronary Angiography;  Surgeon: Leonie Man, MD;  Location: [facility];  Service: Cardiovascular;  Laterality: N/A;  . CATARACT EXTRACTION W/PHACO Right 07/04/2014   Procedure: CATARACT EXTRACTION PHACO AND INTRAOCULAR LENS PLACEMENT (IOC);  Surgeon: Elta Guadeloupe T. Gershon Crane, MD;  Location: [facility];  Service: Ophthalmology;  Laterality: Right;  CDE:13.13  . COLONOSCOPY    . DILATION AND CURETTAGE OF UTERUS     x2  . Epi Retinal Membrane Peel Left   . ERCP    . ESOPHAGEAL BANDING N/A 04/01/2013   Procedure: ESOPHAGEAL BANDING;  Surgeon: Rogene Houston, MD;  Location: [facility];  Service: Endoscopy;  Laterality: N/A;  . ESOPHAGEAL BANDING N/A 05/24/2013   Procedure: ESOPHAGEAL BANDING;  Surgeon: Rogene Houston, MD;  [facility];  Service: Endoscopy;  Laterality: N/A;  . ESOPHAGEAL BANDING N/A 06/21/2014   Procedure: ESOPHAGEAL BANDING;  Surgeon: Rogene Houston, MD;  [facility];  Service: Endoscopy;  Laterality: N/A;  . ESOPHAGOGASTRODUODENOSCOPY N/A 04/01/2013   Procedure: ESOPHAGOGASTRODUODENOSCOPY (EGD);  Surgeon: Rogene Houston, MD;  [facility];  Service: Endoscopy;  Laterality: N/A;  230-rescheduled to 8:30am Ann notified pt  . ESOPHAGOGASTRODUODENOSCOPY N/A 05/24/2013   Procedure: ESOPHAGOGASTRODUODENOSCOPY (EGD);  Surgeon: Rogene Houston, MD;  [facility];  Service: Endoscopy;  Laterality: N/A;  730  . ESOPHAGOGASTRODUODENOSCOPY N/A 06/21/2014   Procedure: ESOPHAGOGASTRODUODENOSCOPY (EGD);  Surgeon: Rogene Houston, MD;  [facility];  Service: Endoscopy;  Laterality: N/A;  930-rescheduled 10/14 @  1200 Ann to notify pt  . EYE SURGERY  08   cataract surgery of the left eye  . FEMUR IM NAIL Left 03/02/2017   Procedure: INTRAMEDULLARY (IM) NAIL FEMORAL;  Surgeon: Rod Can, MD;  Location: [place];  Service: Orthopedics;  Laterality: Left;  . HARDWARE REMOVAL Right 01/17/2013   Procedure: REMOVAL OF HARDWARE AND EXCISION ULNAR STYLOID RIGHT WRIST;  Surgeon: Tennis Must, MD;  Location: [place];  Service: Orthopedics;  Laterality: Right;  . MYRINGOTOMY  2012   both ears  . ORIF FEMUR FRACTURE Left 04/23/2017   Procedure: OPEN REDUCTION INTERNAL FIXATION (ORIF) DISTAL FEMUR FRACTURE (FRACTURE AROUND FEMORAL NAIL);  Surgeon: Altamese Pineville, MD;  Location: [place];  Service: Orthopedics;  Laterality: Left;  . TONSILLECTOMY    . VAGINAL HYSTERECTOMY  1972  . WRIST SURGERY     rt wrist hardwear removal     OB History   No obstetric history on file.     Family History  Problem Relation Age of Onset  . Diabetes Mother   . Heart failure Father   . Heart failure Maternal  Aunt     Social History   Tobacco Use  . Smoking status: Former Smoker    Packs/day: 0.25    Years: 50.00    Pack years: 12.50    Types: Cigarettes    Quit date: 11/03/2017    Years since quitting: 2.1  . Smokeless tobacco: Never Used  . Tobacco comment: 1 pk lasts a week.   Substance Use Topics  . Alcohol use: No    Alcohol/week: 0.0 standard drinks  . Drug use: No    Home Medications Prior to Admission medications   Medication Sig Start Date End Date Taking? Authorizing Provider  acetaminophen (TYLENOL) 325 MG tablet Take 2 tablets (650 mg total) by mouth every 6 (six) hours as needed for headache. 07/15/18   Angiulli, Lavon Paganini, PA-C  aspirin EC 81 MG tablet Take 81 mg by mouth daily.    Jake Samples, PA-C  atorvastatin (LIPITOR) 80 MG tablet Take 1 tablet (80 mg total) by mouth daily. 07/15/18   Angiulli, Lavon Paganini, PA-C  calcium citrate (CALCITRATE - DOSED IN MG ELEMENTAL  CALCIUM) 950 MG tablet Take 1 tablet (200 mg of elemental calcium total) by mouth daily. 07/15/18   Angiulli, Lavon Paganini, PA-C  carvedilol (COREG) 12.5 MG tablet Take 1 tablet (12.5 mg total) by mouth 2 (two) times daily with a meal. 07/15/18   Angiulli, Lavon Paganini, PA-C  clopidogrel (PLAVIX) 75 MG tablet Take 1 tablet (75 mg total) by mouth daily with breakfast. 07/15/18   Angiulli, Lavon Paganini, PA-C  docusate sodium (COLACE) 100 MG capsule Take 100 mg by mouth 3 (three) times daily.    [provider]  Furosemide (LASIX PO) Take by mouth as needed.    [provider]  HYDROcodone-acetaminophen (NORCO) 10-325 MG tablet Take 1 tablet by mouth every 6 (six) hours as needed for severe pain.    [provider]  methocarbamol (ROBAXIN) 500 MG tablet Take 500 mg by mouth 4 (four) times daily.    [provider]  Multiple Vitamins-Minerals (CENTRUM ADULTS) TABS Take 1 tablet by mouth daily.    [provider]  niacin (NIASPAN) 500 MG CR tablet Take 1 tablet by mouth daily. 06/14/18   [provider]  omeprazole (PRILOSEC) 20 MG capsule Take 1 capsule (20 mg total) by mouth daily. 03/05/18   Setzer, Terri L, NP  ondansetron (ZOFRAN) 4 MG tablet TAKE (1) TABLET BY MOUTH EVERY 8 HOURS AS NEEDED. Patient not taking: Reported on 03/16/2019 09/21/18   Butch Penny, NP  ranitidine (ZANTAC) 150 MG tablet Take 1 tablet by mouth 2 (two) times daily as needed. 03/19/18   [provider]  traZODone (DESYREL) 50 MG tablet  11/04/18   [provider]  zolpidem (AMBIEN) 10 MG tablet Take 10 mg by mouth at bedtime as needed for sleep.    [provider]    Allergies    Tape  Review of Systems   Review of Systems  Respiratory: Negative for shortness of breath.   Cardiovascular: Negative for chest pain.  Neurological: Negative for dizziness, syncope, facial asymmetry, weakness, numbness and headaches.    Physical Exam Updated Vital Signs BP (!)  212/68 (BP Location: Left Arm)   Pulse 80   Temp 98.9 F (37.2 C) (Oral)   Resp 18   Ht 5' 3"  (1.6 m)   Wt 59 kg   SpO2 98%   BMI 23.03 kg/m   Physical Exam Constitutional:  General: She is not in acute distress.    Appearance: Normal appearance.  HENT:     Head: Normocephalic.     Nose: Nose normal.     Mouth/Throat:     Mouth: Mucous membranes are moist.     Pharynx: No oropharyngeal exudate or posterior oropharyngeal erythema.  Eyes:     Extraocular Movements: Extraocular movements intact.     Conjunctiva/sclera: Conjunctivae normal.     Pupils: Pupils are equal, round, and reactive to light.  Cardiovascular:     Rate and Rhythm: Normal rate and regular rhythm.     Pulses: Normal pulses.     Heart sounds: Normal heart sounds. No murmur. No friction rub. No gallop.   Pulmonary:     Breath sounds: No wheezing, rhonchi or rales.  Abdominal:     General: Bowel sounds are normal.     Tenderness: There is no abdominal tenderness.  Musculoskeletal:        General: No swelling or tenderness. Normal range of motion.     Cervical back: Normal range of motion and neck supple. No tenderness.     Comments: 5/5 strength Normal grip strength  Skin:    Findings: Bruising present.     Comments: Bruising noted throughout left side of face, healing   Neurological:     General: No focal deficit present.     Mental Status: She is alert and oriented to person, place, and time. Mental status is at baseline.     Cranial Nerves: No cranial nerve deficit.     Sensory: No sensory deficit.     Motor: Weakness present.     Coordination: Coordination normal.     Comments: LLE weakness No finger to nose dysmetria  Psychiatric:        Mood and Affect: Mood normal.     ED Results / Procedures / Treatments   Labs (all labs ordered are listed, but only abnormal results are displayed) Labs Reviewed  CBC  COMPREHENSIVE METABOLIC PANEL  LIPID PANEL  HEMOGLOBIN A1C  TSH     EKG None  Radiology MR BRAIN WO CONTRAST  Result Date: 01/03/2020 CLINICAL DATA:  Difficulty walking, history of stroke EXAM: MRI HEAD WITHOUT CONTRAST TECHNIQUE: Multiplanar, multiecho pulse sequences of the brain and surrounding structures were obtained without intravenous contrast. COMPARISON:  2019 FINDINGS: Motion artifact is present. Brain: There are a few small foci of diffusion hyperintensity with mild ADC hypointensity or isointensity including involvement the left cerebellum, right aspect of the corpus callosum splenium, and left basal ganglia region. Chronic infarcts again identified with the largest involving right frontoparietal lobes and insula. There are small infarcts of the right cerebellum and central gray nuclei. There is no intracranial mass or mass effect. There is no hydrocephalus or extra-axial fluid collection. Additional patchy T2 hyperintensity in the supratentorial and pontine white matter is nonspecific may reflect mild chronic microvascular ischemic changes. Vascular: Major vessel flow voids at the skull base are preserved. Skull and upper cervical spine: Normal marrow signal is preserved. Sinuses/Orbits: Paranasal sinuses are aerated. Orbits are unremarkable. Other: Sella is unremarkable.  Trace mastoid fluid opacification. IMPRESSION: Few small acute to subacute infarcts involving multiple vascular territories. Stable chronic findings detailed above. These results were called by telephone at the time of interpretation on 01/03/2020 at 11:45 am to provider [facility] , who verbally acknowledged these results. Patient will be sent to the emergency department for further evaluation. Electronically Signed   By: Addison Lank.D.  On: 01/03/2020 11:51    Procedures Procedures (including critical care time)  Medications Ordered in ED Medications - No data to display  ED Course  I have reviewed the triage vital signs and the nursing notes.  Pertinent labs &  imaging results that were available during my care of the patient were reviewed by me and considered in my medical decision making (see chart for details).    MDM Rules/Calculators/A&P                      Patient presenting with acute CVA. No neurological symptoms so unclear when patient was last at baseline.  Will not administer TPA as patient is not a candidate.  MRI showing few small acute to subacute infarcts involving multiple vascular territories.  Neurological exam intact and at patient's baseline.  Some left lower extremity weakness but patient states this is residual from her stroke in 2020.  Given acute stroke have consulted neurology.  Spoke to Dr. Merlene Laughter, neurologist on call.  Recommended continuing her aspirin and Plavix.  Did recommend admission for obvious.  Did state that patient would benefit from a loop recorder to monitor for events such as A. fib that could be leading to her multiple strokes.  Also recommended CTA head and neck as well as echocardiogram for further evaluation.  Have placed orders for CTA head and neck as well as echocardiogram.  Have placed consult to hospitalist for admission.    12:47 Spoke to Dr. Denton Brick, hospitalist on call. Dr. Denton Brick has agreed for admission.   Discussed patient with Dr. Reather Converse who also saw patient. Patient was updated on admission.   Final Clinical Impression(s) / ED Diagnoses Final diagnoses:  Cerebrovascular accident (CVA), unspecified mechanism [facility])    Rx / [place] Orders ED Discharge Orders    None       Caroline More, DO 01/03/20 1248    Elnora Morrison, MD 01/05/20 [phone]

## 2020-01-03 NOTE — Progress Notes (Signed)
   01/03/20 1836  Provider Notification  Provider Name/Title Dr Roxan Hockey  Date Provider Notified 01/03/20  Time Provider Notified 1727  Notification Type Page (secure chat message)  Notification Reason Other (Comment) (messaged MD to discuss desired neuro check/vitals frequency for stroke admission)  Date of Provider Response 01/03/20  Time of Provider Response 11    Dr. Denton Brick stated would address orders.

## 2020-01-03 NOTE — TOC Initial Note (Signed)
Transition of Care [facility]) - Initial/Assessment Note   Patient Details  Name: [name] MRN: [id number] Date of Birth: May 14, 1941  [facility] of [facility]) CM/SW Contact:    Sherie Don, LCSW Phone Number: 01/03/2020, 5:55 PM  Clinical Narrative: Patient is a 79 year old female who is currently under observation for an acute cerebrovascular accident. TOC received consult for possible HH/DME needs. CSW called patient's son, Journee Bobrowski, to complete initial assessment as he is the patient's legal guardian. Per son, patient lives in a single family home with two granddaughters, [name] and Angelica Detienne, who are both studying nursing and currently provide most of the patient's care.  Patient has a wheelchair, shower chair, and bedside commode at home. She has a history of HH with [facility], but son reports patient has not received HH since COVID. TOC to follow.          Expected Discharge Plan: [place] Barriers to Discharge: Continued Medical Work up  Patient Goals and CMS Choice Patient states their goals for this hospitalization and ongoing recovery are:: Return home CMS Medicare.gov Compare Post Acute Care list provided to:: Patient Represenative (must comment)(Robert Boomershine (son) is patient's legal guardian) Choice offered to / list presented to : Adult Children  Expected Discharge Plan and Services Expected Discharge Plan: [place]  Prior Living Arrangements/Services Lives with:: Relatives(Granddaughters: [name] and Angelica) Patient language and need for interpreter reviewed:: Yes Do you feel safe going back to the place where you live?: Yes      Need for Family Participation in Patient Care: Yes (Comment)(Patient's son, Sharnell Knight, is her legal guardian) Care giver support system in place?: Yes (comment)(Son; 2 granddaughters live with her)   Criminal Activity/Legal Involvement Pertinent to Current Situation/Hospitalization: No - Comment as  needed  Activities of Daily Living Home Assistive Devices/Equipment: Wheelchair, Eyeglasses ADL Screening (condition at time of admission) Patient's cognitive ability adequate to safely complete daily activities?: Yes Is the patient deaf or have difficulty hearing?: No Does the patient have difficulty seeing, even when wearing glasses/contacts?: No Does the patient have difficulty concentrating, remembering, or making decisions?: No Patient able to express need for assistance with ADLs?: Yes Does the patient have difficulty dressing or bathing?: No Independently performs ADLs?: Yes (appropriate for developmental age) Does the patient have difficulty walking or climbing stairs?: Yes Weakness of Legs: Both Weakness of Arms/Hands: None  Emotional Assessment Appearance:: Appears stated age Attitude/Demeanor/Rapport: Engaged Affect (typically observed): Accepting Orientation: : Oriented to Self, Oriented to Place, Oriented to  Time, Oriented to Situation Alcohol / Substance Use: Not Applicable Psych Involvement: No (comment)  Admission diagnosis:  Acute CVA (cerebrovascular accident) ([place]) [I63.9] Cerebrovascular accident (CVA), unspecified mechanism ([place]) [I63.9] Patient Active Problem List   Diagnosis Date Noted  . Acute CVA (cerebrovascular accident) ([place]) 01/03/2020  . Tobacco use disorder 07/09/2018  . Small vessel disease ([place]) 07/09/2018  . Benign essential HTN   . Seizures ([place])   . Tachypnea   . Dysphagia, post-stroke   . Dysarthria 05/19/2018  . Syncope and collapse   . Syncope 08/24/2017  . Closed left femoral fracture ([place]) 04/21/2017  . Seizure disorder ([place]) 04/21/2017  . Closed pertrochanteric fracture of femur, left, initial encounter ([place]) 03/02/2017  . Seizure ([place]) 02/28/2017  . Hip fracture ([place]) 02/28/2017  . Diabetes mellitus without complication ([place]) 92/42/6834  . Epiretinal membrane (ERM) of left eye 01/20/2017  . Pseudophakia 01/20/2017  . Chest pain  12/07/2016  .  Atypical chest pain 12/07/2016  . Tachycardia   . NSTEMI (non-ST elevated myocardial infarction) ([place]) 11/02/2016  . Multifocal atrial tachycardia ([place]) 09/25/2016  . Elevated troponin 09/24/2016  . Hypokalemia 09/23/2016  . TIA (transient ischemic attack) 05/05/2016  . Intractable nausea and vomiting 10/15/2015  . Acute coronary syndrome ([place]) 06/01/2015  . Bronchitis 06/01/2015  . Thrombocytopenia ([place]) 06/01/2015  . Cerebral infarction ([place]) right occipital due to small vessel disease s/p tPA 03/06/2014  . Lower urinary tract infectious disease   . PSVT (paroxysmal supraventricular tachycardia) ([place]) 12/09/2013  . Esophageal varices ([place]) 03/29/2013  . Hematemesis 03/29/2013  . Hepatic cirrhosis ([place]) 03/29/2013  . Presence of stent in right coronary artery 07/04/2011  . OSA (obstructive sleep apnea) 07/04/2011  . Aortic sclerosis 07/04/2011  . Aortic insufficiency 07/04/2011  . HTN (hypertension) 07/04/2011  . Carotid stenosis, bilateral 07/04/2011  . Chest pain at rest 07/03/2011  . CAD (coronary artery disease) 07/03/2011  . Hypercholesteremia 07/03/2011  . GERD (gastroesophageal reflux disease) 07/03/2011   PCP:  Redmond School, MD Pharmacy:   [facility] [place] [address] [place] [address] Phone: [phone] Fax: [phone]  Readmission Risk Interventions No flowsheet data found.

## 2020-01-03 NOTE — Progress Notes (Signed)
Patient refused CPAP. No unit in room at this time. 

## 2020-01-03 NOTE — H&P (Addendum)
Patient Demographics:    [name], is a 79 y.o. female  MRN: [id number]   DOB - 08-Jan-1941  Admit Date - 01/03/2020  Outpatient Primary MD for the patient is Redmond School, MD   Assessment & Plan:    Principal Problem:   Acute CVA (cerebrovascular accident) [facility]) Active Problems:   CAD (coronary artery disease)   Presence of stent in right coronary artery   OSA (obstructive sleep apnea)   HTN (hypertension)   Carotid stenosis, bilateral   PSVT (paroxysmal supraventricular tachycardia) ([place])   Diabetes mellitus without complication ([place])   1)Acute CVA- EDP discussed case with patient's neurologist Dr. Merlene Laughter requested hospitalization overnight for observation -Please contact cardiology service in the a.m. to schedule outpatient event monitor/loop recorder for patient -Patient has no new complaints -Patient denies any new unilateral weakness or speech problems or swallowing difficulties -Continue aspirin and Plavix and Lipitor 80 mg daily and niacin -Patient admits to recent fall with left zygomatic area swelling --MRI brain with Few small acute to subacute infarcts involving multiple vascular Territories. ---Patient being admitted for further stroke work-up -CTA head and neck without LVO or other new findings -TSH 0.5, A1c is 5.2, LDL is 83 with HDL of 63 -PT, OT and speech eval requested -Echo pending -Please discussed with Dr. Merlene Laughter the neurologist prior to DC home in a.m.  2)HTN--stable, avoid being too aggressive with BP control at this time, continue Cardizem and metoprolol  3)H/o PSVT--Cardizem and metoprolol as above #2  4) history of CAD with prior angioplasty and stent placement/history of PAD with prior known carotid artery stenosis--- CTA neck noted without new acute  findings -Continue metoprolol, aspirin, Plavix and Lipitor as well as niacin  5) ambulatory dysfunction--- at baseline patient gets around with wheelchair for long distances and walker for very short distances -Patient has residual chronic left-sided hemiparesis from prior strokes   With History of - Reviewed by me  Past Medical History:  Diagnosis Date  . Anemia   . Aortic insufficiency    Moderate  . Back pain   . Carotid stenosis, bilateral   . Cirrhosis ([place])   . Closed left femoral fracture ([place]) 04/21/2017  . Coronary artery disease    Stent x 2 RCA 1995, cardiac catheterization 09/2016 showing only mild atherosclerosis  . DDD (degenerative disc disease), lumbar   . Essential hypertension   . Falls   . GERD (gastroesophageal reflux disease)   . Hiatal hernia   . History of stroke   . Hypercholesteremia   . IBS (irritable bowel syndrome)   . Non-alcoholic fatty liver disease   . OSA (obstructive sleep apnea)    no CPAP  . Pericardial effusion    a. small by echo 2018.  Marland Kitchen PSVT (paroxysmal supraventricular tachycardia) ([place])   . Recurrent UTI   . Seizures ([place])   . Stroke ([place])   . Type 2 diabetes mellitus ([place])    off medication  .  Varices, esophageal ([place])       Past Surgical History:  Procedure Laterality Date  . [place]  . APPENDECTOMY    . BACK SURGERY  1985  . CARDIAC CATHETERIZATION  [phone]   Stent to the proximal RCA after MI   . CARDIAC CATHETERIZATION N/A 09/26/2016   Procedure: Left Heart Cath and Coronary Angiography;  Surgeon: Leonie Man, MD;  Location: [place] [facility];  Service: Cardiovascular;  Laterality: N/A;  . CATARACT EXTRACTION W/PHACO Right 07/04/2014   Procedure: CATARACT EXTRACTION PHACO AND INTRAOCULAR LENS PLACEMENT (IOC);  Surgeon: Elta Guadeloupe T. Gershon Crane, MD;  Location: [facility];  Service: Ophthalmology;  Laterality: Right;  CDE:13.13  . COLONOSCOPY    . DILATION AND CURETTAGE OF UTERUS     x2  . Epi  Retinal Membrane Peel Left   . ERCP    . ESOPHAGEAL BANDING N/A 04/01/2013   Procedure: ESOPHAGEAL BANDING;  Surgeon: Rogene Houston, MD;  Location[facility];  Service: Endoscopy;  Laterality: N/A;  . ESOPHAGEAL BANDING N/A 05/24/2013   Procedure: ESOPHAGEAL BANDING;  Surgeon: Rogene Houston, MD;  [facility];  Service: Endoscopy;  Laterality: N/A;  . ESOPHAGEAL BANDING N/A 06/21/2014   Procedure: ESOPHAGEAL BANDING;  Surgeon: Rogene Houston, MD;  [facility];  Service: Endoscopy;  Laterality: N/A;  . ESOPHAGOGASTRODUODENOSCOPY N/A 04/01/2013   Procedure: ESOPHAGOGASTRODUODENOSCOPY (EGD);  Surgeon: Rogene Houston, MD;  [facility];  Service: Endoscopy;  Laterality: N/A;  230-rescheduled to 8:30am Ann notified pt  . ESOPHAGOGASTRODUODENOSCOPY N/A 05/24/2013   Procedure: ESOPHAGOGASTRODUODENOSCOPY (EGD);  Surgeon: Rogene Houston, MD;  [facility];  Service: Endoscopy;  Laterality: N/A;  730  . ESOPHAGOGASTRODUODENOSCOPY N/A 06/21/2014   Procedure: ESOPHAGOGASTRODUODENOSCOPY (EGD);  Surgeon: Rogene Houston, MD;  [facility];  Service: Endoscopy;  Laterality: N/A;  930-rescheduled 10/14 @ 1200 Ann to notify pt  . EYE SURGERY  08   cataract surgery of the left eye  . FEMUR IM NAIL Left 03/02/2017   Procedure: INTRAMEDULLARY (IM) NAIL FEMORAL;  Surgeon: Rod Can, MD;  Location: [facility];  Service: Orthopedics;  Laterality: Left;  . HARDWARE REMOVAL Right 01/17/2013   Procedure: REMOVAL OF HARDWARE AND EXCISION ULNAR STYLOID RIGHT WRIST;  Surgeon: Tennis Must, MD;  Location: [facility];  Service: Orthopedics;  Laterality: Right;  . MYRINGOTOMY  2012   both ears  . ORIF FEMUR FRACTURE Left 04/23/2017   Procedure: OPEN REDUCTION INTERNAL FIXATION (ORIF) DISTAL FEMUR FRACTURE (FRACTURE AROUND FEMORAL NAIL);  Surgeon: Altamese [place], MD;  Location: [facility];  Service: Orthopedics;  Laterality: Left;  . TONSILLECTOMY    .  VAGINAL HYSTERECTOMY  1972  . WRIST SURGERY     rt wrist hardwear removal      Chief Complaint  Patient presents with  . Abnormal Lab      HPI:    [name]  is a 79 y.o. female right-handed female with history of aortic insufficiency, CAD with stenting,HTN, DM2 seizure disorder, with ambulatory dysfunction uses walker for short distances, Uses a wheelchair for longer distances who presented for an outpatient follow-up MRI as ordered by her provider and MRI brain showed new strokes -EDP discussed case with patient's neurologist Dr. Merlene Laughter requested hospitalization overnight for observation -Patient has no new complaints -Patient denies any new unilateral weakness or speech problems or swallowing difficulties -Patient admits to recent fall with left zygomatic area swelling --MRI  brain with Few small acute to subacute infarcts involving multiple vascular Territories. --Patient being admitted for further stroke work-up -CTA head and neck without LVO or other new findings -TSH 0.5, A1c is 5.2, LDL is 83 with HDL of 63   Review of systems:    In addition to the HPI above,   A full Review of  Systems was done, all other systems reviewed are negative except as noted above in HPI , .    Social History:  Reviewed by me    Social History   Tobacco Use  . Smoking status: Former Smoker    Packs/day: 0.25    Years: 50.00    Pack years: 12.50    Types: Cigarettes    Quit date: 11/03/2017    Years since quitting: 2.1  . Smokeless tobacco: Never Used  . Tobacco comment: 1 pk lasts a week.   Substance Use Topics  . Alcohol use: No    Alcohol/week: 0.0 standard drinks      Family History :  Reviewed by me    Family History  Problem Relation Age of Onset  . Diabetes Mother   . Heart failure Father   . Heart failure Maternal Aunt       Home Medications:   Prior to Admission medications   Medication Sig Start Date End Date Taking? Authorizing Provider  aspirin EC  81 MG tablet Take 81 mg by mouth daily.   Yes Jake Samples, PA-C  aspirin-acetaminophen-caffeine (EXCEDRIN MIGRAINE) [phone] MG tablet Take 1 tablet by mouth every 6 (six) hours as needed for headache.   Yes [provider]  atorvastatin (LIPITOR) 80 MG tablet Take 1 tablet (80 mg total) by mouth daily. 07/15/18  Yes Angiulli, Lavon Paganini, PA-C  calcium citrate (CALCITRATE - DOSED IN MG ELEMENTAL CALCIUM) 950 MG tablet Take 1 tablet (200 mg of elemental calcium total) by mouth daily. 07/15/18  Yes Angiulli, Lavon Paganini, PA-C  carvedilol (COREG) 12.5 MG tablet Take 1 tablet (12.5 mg total) by mouth 2 (two) times daily with a meal. 07/15/18  Yes Angiulli, Lavon Paganini, PA-C  clopidogrel (PLAVIX) 75 MG tablet Take 1 tablet (75 mg total) by mouth daily with breakfast. 07/15/18  Yes Angiulli, Lavon Paganini, PA-C  diltiazem (TIAZAC) 120 MG 24 hr capsule Take 1 capsule by mouth daily.   Yes [provider]  docusate sodium (COLACE) 100 MG capsule Take 100 mg by mouth daily.    Yes [provider]  furosemide (LASIX) 20 MG tablet Take 20 mg by mouth daily.  12/26/19  Yes [provider]  metoprolol succinate (TOPROL-XL) 25 MG 24 hr tablet Take 25 mg by mouth daily. 12/26/19  Yes [provider]  Multiple Vitamins-Minerals (CENTRUM ADULTS) TABS Take 1 tablet by mouth daily.   Yes [provider]  niacin (NIASPAN) 500 MG CR tablet Take 1 tablet by mouth daily. 06/14/18  Yes [provider]  omeprazole (PRILOSEC) 20 MG capsule Take 1 capsule (20 mg total) by mouth daily. 03/05/18  Yes Setzer, Rona Ravens, NP  oxymetazoline (AFRIN) 0.05 % nasal spray Place 1 spray into both nostrils 2 (two) times daily as needed for congestion.   Yes [provider]  traZODone (DESYREL) 50 MG tablet Take 50 mg by mouth at bedtime as needed.  11/04/18  Yes [provider]  zolpidem (AMBIEN) 10 MG tablet Take 10 mg by mouth at bedtime as needed for sleep.   Yes  [provider]  Allergies:     Allergies  Allergen Reactions  . Tape Rash and Other (See Comments)    Please use paper tape     Physical Exam:   Vitals  Blood pressure (!) 183/48, pulse 77, temperature 98.8 F (37.1 C), temperature source Oral, resp. rate 19, height 5' 3"  (1.6 m), weight 58.6 kg, SpO2 97 %.  Physical Examination: General appearance - alert, well appearing, and in no distress  --- Facial ecchymosis/bruising especially over the left zygomatic area Mental status - alert, oriented to person, place, and time,  Eyes - sclera anicteric Neck - supple, no JVD elevation , Chest - clear  to auscultation bilaterally, symmetrical air movement,  Heart - S1 and S2 normal, regular  Abdomen - soft, nontender, nondistended, no masses or organomegaly Neurological -Patient has residual chronic left-sided hemiparesis,  Extremities - no pedal edema noted, intact peripheral pulses  Skin - warm, dry     Data Review:    CBC Recent Labs  Lab 01/03/20 1304  WBC 6.1  HGB 12.3  HCT 38.4  PLT 142*  MCV 94.6  MCH 30.3  MCHC 32.0  RDW 14.9   ------------------------------------------------------------------------------------------------------------------  Chemistries  Recent Labs  Lab 01/03/20 1304  NA 146*  K 3.4*  CL 107  CO2 29  GLUCOSE 94  BUN 10  CREATININE 0.93  CALCIUM 8.8*  AST 48*  ALT 27  ALKPHOS 122  BILITOT 1.1   ------------------------------------------------------------------------------------------------------------------ estimated creatinine clearance is 41.9 mL/min (by C-G formula based on SCr of 0.93 mg/dL). ------------------------------------------------------------------------------------------------------------------ Recent Labs    01/03/20 1304  TSH 0.504     Coagulation profile No results for input(s): INR, PROTIME in the last 168  hours. ------------------------------------------------------------------------------------------------------------------- No results for input(s): DDIMER in the last 72 hours. -------------------------------------------------------------------------------------------------------------------  Cardiac Enzymes No results for input(s): CKMB, TROPONINI, MYOGLOBIN in the last 168 hours.  Invalid input(s): CK ------------------------------------------------------------------------------------------------------------------ No results found for: BNP   ---------------------------------------------------------------------------------------------------------------  Urinalysis    Component Value Date/Time   COLORURINE YELLOW 11/04/2019 [place] 11/04/2019 1456   LABSPEC 1.008 11/04/2019 1456   PHURINE 7.0 11/04/2019 1456   GLUCOSEU NEGATIVE 11/04/2019 1456   HGBUR SMALL (A) 11/04/2019 1456   BILIRUBINUR NEGATIVE 11/04/2019 1456   KETONESUR NEGATIVE 11/04/2019 1456   PROTEINUR NEGATIVE 11/04/2019 1456   UROBILINOGEN 0.2 07/09/2015 0930   NITRITE NEGATIVE 11/04/2019 1456   LEUKOCYTESUR NEGATIVE 11/04/2019 1456    ----------------------------------------------------------------------------------------------------------------   Imaging Results:    CT Angio Head W or Wo Contrast  Result Date: 01/03/2020 CLINICAL DATA:  Small strokes on MRI EXAM: CT ANGIOGRAPHY HEAD AND NECK TECHNIQUE: Multidetector CT imaging of the head and neck was performed using the standard protocol during bolus administration of intravenous contrast. Multiplanar CT image reconstructions and MIPs were obtained to evaluate the vascular anatomy. Carotid stenosis measurements (when applicable) are obtained utilizing NASCET criteria, using the distal internal carotid diameter as the denominator. CONTRAST:  54m OMNIPAQUE IOHEXOL 350 MG/ML SOLN COMPARISON:  07/06/2018 FINDINGS: CT HEAD FINDINGS Brain: There is  no acute intracranial hemorrhage, mass effect, or edema. Recent small infarcts seen on same day MRI. There is a chronic right MCA territory infarction. Additional chronic small vessel infarcts of the central gray matter and right cerebellum. There is no extra-axial fluid collection. No hydrocephalus. Vascular: There is atherosclerotic calcification at the skull base. Skull: Calvarium is unremarkable. Sinuses/Orbits: No acute finding. Other: None. Review of the MIP images confirms the above findings CTA NECK FINDINGS Aortic arch: There is  calcified plaque along the arch and patent great vessel origins. Right carotid system: Patent. Mild calcified plaque along the common carotid. There is calcified plaque at the ICA origin causing less than 50% stenosis. Left carotid system: Patent. Calcified plaque at the bifurcation causing less than 50% stenosis. Vertebral arteries: Patent and codominant. Skeleton: Multilevel degenerative changes are similar in appearance Other neck: Stable thyroid nodule. A new 2.5 x 1.5 cm area of subcutaneous soft tissue at the left cheek is noted. Upper chest: No apical lung mass. Review of the MIP images confirms the above findings CTA HEAD FINDINGS Anterior circulation: Intracranial internal carotid arteries are patent with calcified plaque but no significant stenosis. Anterior and middle cerebral arteries are patent. Anterior communicating artery is present. Posterior circulation: Intracranial vertebral arteries are patent with mild calcified plaque. Basilar artery is patent. Posterior cerebral arteries are patent. Stable focal moderate right proximal P2 PCA stenosis. Venous sinuses: As permitted by contrast timing, patent. Review of the MIP images confirms the above findings IMPRESSION: No acute intracranial hemorrhage. Small recent infarcts as seen on same day MRI. No large vessel occlusion. Stable appearance of plaque at the ICA origins causing less than 50% stenosis. Stable moderate  right P2 PCA stenosis. New subcutaneous soft tissue at the left cheek, which could reflect a hematoma. Electronically Signed   By: Macy Mis M.D.   On: 01/03/2020 14:40   CT Angio Neck W and/or Wo Contrast  Result Date: 01/03/2020 CLINICAL DATA:  Small strokes on MRI EXAM: CT ANGIOGRAPHY HEAD AND NECK TECHNIQUE: Multidetector CT imaging of the head and neck was performed using the standard protocol during bolus administration of intravenous contrast. Multiplanar CT image reconstructions and MIPs were obtained to evaluate the vascular anatomy. Carotid stenosis measurements (when applicable) are obtained utilizing NASCET criteria, using the distal internal carotid diameter as the denominator. CONTRAST:  21m OMNIPAQUE IOHEXOL 350 MG/ML SOLN COMPARISON:  07/06/2018 FINDINGS: CT HEAD FINDINGS Brain: There is no acute intracranial hemorrhage, mass effect, or edema. Recent small infarcts seen on same day MRI. There is a chronic right MCA territory infarction. Additional chronic small vessel infarcts of the central gray matter and right cerebellum. There is no extra-axial fluid collection. No hydrocephalus. Vascular: There is atherosclerotic calcification at the skull base. Skull: Calvarium is unremarkable. Sinuses/Orbits: No acute finding. Other: None. Review of the MIP images confirms the above findings CTA NECK FINDINGS Aortic arch: There is calcified plaque along the arch and patent great vessel origins. Right carotid system: Patent. Mild calcified plaque along the common carotid. There is calcified plaque at the ICA origin causing less than 50% stenosis. Left carotid system: Patent. Calcified plaque at the bifurcation causing less than 50% stenosis. Vertebral arteries: Patent and codominant. Skeleton: Multilevel degenerative changes are similar in appearance Other neck: Stable thyroid nodule. A new 2.5 x 1.5 cm area of subcutaneous soft tissue at the left cheek is noted. Upper chest: No apical lung mass.  Review of the MIP images confirms the above findings CTA HEAD FINDINGS Anterior circulation: Intracranial internal carotid arteries are patent with calcified plaque but no significant stenosis. Anterior and middle cerebral arteries are patent. Anterior communicating artery is present. Posterior circulation: Intracranial vertebral arteries are patent with mild calcified plaque. Basilar artery is patent. Posterior cerebral arteries are patent. Stable focal moderate right proximal P2 PCA stenosis. Venous sinuses: As permitted by contrast timing, patent. Review of the MIP images confirms the above findings IMPRESSION: No acute intracranial hemorrhage. Small recent infarcts as seen on  same day MRI. No large vessel occlusion. Stable appearance of plaque at the ICA origins causing less than 50% stenosis. Stable moderate right P2 PCA stenosis. New subcutaneous soft tissue at the left cheek, which could reflect a hematoma. Electronically Signed   By: Macy Mis M.D.   On: 01/03/2020 14:40   MR BRAIN WO CONTRAST  Result Date: 01/03/2020 CLINICAL DATA:  Difficulty walking, history of stroke EXAM: MRI HEAD WITHOUT CONTRAST TECHNIQUE: Multiplanar, multiecho pulse sequences of the brain and surrounding structures were obtained without intravenous contrast. COMPARISON:  2019 FINDINGS: Motion artifact is present. Brain: There are a few small foci of diffusion hyperintensity with mild ADC hypointensity or isointensity including involvement the left cerebellum, right aspect of the corpus callosum splenium, and left basal ganglia region. Chronic infarcts again identified with the largest involving right frontoparietal lobes and insula. There are small infarcts of the right cerebellum and central gray nuclei. There is no intracranial mass or mass effect. There is no hydrocephalus or extra-axial fluid collection. Additional patchy T2 hyperintensity in the supratentorial and pontine white matter is nonspecific may reflect mild  chronic microvascular ischemic changes. Vascular: Major vessel flow voids at the skull base are preserved. Skull and upper cervical spine: Normal marrow signal is preserved. Sinuses/Orbits: Paranasal sinuses are aerated. Orbits are unremarkable. Other: Sella is unremarkable.  Trace mastoid fluid opacification. IMPRESSION: Few small acute to subacute infarcts involving multiple vascular territories. Stable chronic findings detailed above. These results were called by telephone at the time of interpretation on 01/03/2020 at 11:45 am to provider [facility] , who verbally acknowledged these results. Patient will be sent to the emergency department for further evaluation. Electronically Signed   By: Macy Mis M.D.   On: 01/03/2020 11:51    Radiological Exams on Admission: CT Angio Head W or Wo Contrast  Result Date: 01/03/2020 CLINICAL DATA:  Small strokes on MRI EXAM: CT ANGIOGRAPHY HEAD AND NECK TECHNIQUE: Multidetector CT imaging of the head and neck was performed using the standard protocol during bolus administration of intravenous contrast. Multiplanar CT image reconstructions and MIPs were obtained to evaluate the vascular anatomy. Carotid stenosis measurements (when applicable) are obtained utilizing NASCET criteria, using the distal internal carotid diameter as the denominator. CONTRAST:  33m OMNIPAQUE IOHEXOL 350 MG/ML SOLN COMPARISON:  07/06/2018 FINDINGS: CT HEAD FINDINGS Brain: There is no acute intracranial hemorrhage, mass effect, or edema. Recent small infarcts seen on same day MRI. There is a chronic right MCA territory infarction. Additional chronic small vessel infarcts of the central gray matter and right cerebellum. There is no extra-axial fluid collection. No hydrocephalus. Vascular: There is atherosclerotic calcification at the skull base. Skull: Calvarium is unremarkable. Sinuses/Orbits: No acute finding. Other: None. Review of the MIP images confirms the above findings CTA NECK  FINDINGS Aortic arch: There is calcified plaque along the arch and patent great vessel origins. Right carotid system: Patent. Mild calcified plaque along the common carotid. There is calcified plaque at the ICA origin causing less than 50% stenosis. Left carotid system: Patent. Calcified plaque at the bifurcation causing less than 50% stenosis. Vertebral arteries: Patent and codominant. Skeleton: Multilevel degenerative changes are similar in appearance Other neck: Stable thyroid nodule. A new 2.5 x 1.5 cm area of subcutaneous soft tissue at the left cheek is noted. Upper chest: No apical lung mass. Review of the MIP images confirms the above findings CTA HEAD FINDINGS Anterior circulation: Intracranial internal carotid arteries are patent with calcified plaque but no significant stenosis. Anterior  and middle cerebral arteries are patent. Anterior communicating artery is present. Posterior circulation: Intracranial vertebral arteries are patent with mild calcified plaque. Basilar artery is patent. Posterior cerebral arteries are patent. Stable focal moderate right proximal P2 PCA stenosis. Venous sinuses: As permitted by contrast timing, patent. Review of the MIP images confirms the above findings IMPRESSION: No acute intracranial hemorrhage. Small recent infarcts as seen on same day MRI. No large vessel occlusion. Stable appearance of plaque at the ICA origins causing less than 50% stenosis. Stable moderate right P2 PCA stenosis. New subcutaneous soft tissue at the left cheek, which could reflect a hematoma. Electronically Signed   By: Macy Mis M.D.   On: 01/03/2020 14:40   CT Angio Neck W and/or Wo Contrast  Result Date: 01/03/2020 CLINICAL DATA:  Small strokes on MRI EXAM: CT ANGIOGRAPHY HEAD AND NECK TECHNIQUE: Multidetector CT imaging of the head and neck was performed using the standard protocol during bolus administration of intravenous contrast. Multiplanar CT image reconstructions and MIPs were  obtained to evaluate the vascular anatomy. Carotid stenosis measurements (when applicable) are obtained utilizing NASCET criteria, using the distal internal carotid diameter as the denominator. CONTRAST:  81m OMNIPAQUE IOHEXOL 350 MG/ML SOLN COMPARISON:  07/06/2018 FINDINGS: CT HEAD FINDINGS Brain: There is no acute intracranial hemorrhage, mass effect, or edema. Recent small infarcts seen on same day MRI. There is a chronic right MCA territory infarction. Additional chronic small vessel infarcts of the central gray matter and right cerebellum. There is no extra-axial fluid collection. No hydrocephalus. Vascular: There is atherosclerotic calcification at the skull base. Skull: Calvarium is unremarkable. Sinuses/Orbits: No acute finding. Other: None. Review of the MIP images confirms the above findings CTA NECK FINDINGS Aortic arch: There is calcified plaque along the arch and patent great vessel origins. Right carotid system: Patent. Mild calcified plaque along the common carotid. There is calcified plaque at the ICA origin causing less than 50% stenosis. Left carotid system: Patent. Calcified plaque at the bifurcation causing less than 50% stenosis. Vertebral arteries: Patent and codominant. Skeleton: Multilevel degenerative changes are similar in appearance Other neck: Stable thyroid nodule. A new 2.5 x 1.5 cm area of subcutaneous soft tissue at the left cheek is noted. Upper chest: No apical lung mass. Review of the MIP images confirms the above findings CTA HEAD FINDINGS Anterior circulation: Intracranial internal carotid arteries are patent with calcified plaque but no significant stenosis. Anterior and middle cerebral arteries are patent. Anterior communicating artery is present. Posterior circulation: Intracranial vertebral arteries are patent with mild calcified plaque. Basilar artery is patent. Posterior cerebral arteries are patent. Stable focal moderate right proximal P2 PCA stenosis. Venous sinuses: As  permitted by contrast timing, patent. Review of the MIP images confirms the above findings IMPRESSION: No acute intracranial hemorrhage. Small recent infarcts as seen on same day MRI. No large vessel occlusion. Stable appearance of plaque at the ICA origins causing less than 50% stenosis. Stable moderate right P2 PCA stenosis. New subcutaneous soft tissue at the left cheek, which could reflect a hematoma. Electronically Signed   By: PMacy MisM.D.   On: 01/03/2020 14:40   MR BRAIN WO CONTRAST  Result Date: 01/03/2020 CLINICAL DATA:  Difficulty walking, history of stroke EXAM: MRI HEAD WITHOUT CONTRAST TECHNIQUE: Multiplanar, multiecho pulse sequences of the brain and surrounding structures were obtained without intravenous contrast. COMPARISON:  2019 FINDINGS: Motion artifact is present. Brain: There are a few small foci of diffusion hyperintensity with mild ADC hypointensity or isointensity including  involvement the left cerebellum, right aspect of the corpus callosum splenium, and left basal ganglia region. Chronic infarcts again identified with the largest involving right frontoparietal lobes and insula. There are small infarcts of the right cerebellum and central gray nuclei. There is no intracranial mass or mass effect. There is no hydrocephalus or extra-axial fluid collection. Additional patchy T2 hyperintensity in the supratentorial and pontine white matter is nonspecific may reflect mild chronic microvascular ischemic changes. Vascular: Major vessel flow voids at the skull base are preserved. Skull and upper cervical spine: Normal marrow signal is preserved. Sinuses/Orbits: Paranasal sinuses are aerated. Orbits are unremarkable. Other: Sella is unremarkable.  Trace mastoid fluid opacification. IMPRESSION: Few small acute to subacute infarcts involving multiple vascular territories. Stable chronic findings detailed above. These results were called by telephone at the time of interpretation on  01/03/2020 at 11:45 am to provider [facility] , who verbally acknowledged these results. Patient will be sent to the emergency department for further evaluation. Electronically Signed   By: Macy Mis M.D.   On: 01/03/2020 11:51    DVT Prophylaxis -SCD  /heparin AM Labs Ordered, also please review Full Orders  Family Communication: Admission, patients condition and plan of care including tests being ordered have been discussed with the patient  who indicate understanding and agree with the plan   Code Status - Full Code  Likely DC to  Home with home health versus SNF  Condition   stable*  Roxan Hockey M.D on 01/03/2020 at 8:20 PM Go to [email or web address] -  for contact info  [facility] - Office  [phone]

## 2020-01-03 NOTE — Progress Notes (Signed)
Consult request has been received. CSW attempting to follow up at present time  [place] Worker  Ph: [phone]

## 2020-01-03 NOTE — Progress Notes (Signed)
SLP Cancellation Note  Patient Details Name: [name] MRN: [id number] DOB: Sep 24, 1940   Cancelled treatment:       Reason Eval/Treat Not Completed: SLP screened, no needs identified, will sign off. Pt passed [facility] stroke swallow screen in the ED indicating no BSE is needed at this time. If further needs are identified please re-consult. Thank you,  Trayven Lumadue H. Roddie Mc, CCC-SLP Speech Language Pathologist    Wende Bushy 01/03/2020, 5:02 PM

## 2020-01-03 NOTE — ED Triage Notes (Signed)
Pt reports that she had a scheduled MRI today for follow up. Pt had a stroke one year ago and pt reports neurologist wanted MRI and has been waiting 6 months for appt and was sent to ED due to acute to subacute infarcts

## 2020-01-04 ENCOUNTER — Other Ambulatory Visit (HOSPITAL_COMMUNITY): Payer: Medicare Other

## 2020-01-04 ENCOUNTER — Other Ambulatory Visit: Payer: Self-pay | Admitting: Student

## 2020-01-04 DIAGNOSIS — Z20822 Contact with and (suspected) exposure to covid-19: Secondary | ICD-10-CM | POA: Diagnosis present

## 2020-01-04 DIAGNOSIS — Z961 Presence of intraocular lens: Secondary | ICD-10-CM | POA: Diagnosis present

## 2020-01-04 DIAGNOSIS — K589 Irritable bowel syndrome without diarrhea: Secondary | ICD-10-CM | POA: Diagnosis present

## 2020-01-04 DIAGNOSIS — G8191 Hemiplegia, unspecified affecting right dominant side: Secondary | ICD-10-CM | POA: Diagnosis not present

## 2020-01-04 DIAGNOSIS — I639 Cerebral infarction, unspecified: Secondary | ICD-10-CM | POA: Diagnosis present

## 2020-01-04 DIAGNOSIS — E119 Type 2 diabetes mellitus without complications: Secondary | ICD-10-CM | POA: Diagnosis present

## 2020-01-04 DIAGNOSIS — I2510 Atherosclerotic heart disease of native coronary artery without angina pectoris: Secondary | ICD-10-CM | POA: Diagnosis present

## 2020-01-04 DIAGNOSIS — Z79899 Other long term (current) drug therapy: Secondary | ICD-10-CM | POA: Diagnosis not present

## 2020-01-04 DIAGNOSIS — K760 Fatty (change of) liver, not elsewhere classified: Secondary | ICD-10-CM | POA: Diagnosis present

## 2020-01-04 DIAGNOSIS — I351 Nonrheumatic aortic (valve) insufficiency: Secondary | ICD-10-CM | POA: Diagnosis present

## 2020-01-04 DIAGNOSIS — K219 Gastro-esophageal reflux disease without esophagitis: Secondary | ICD-10-CM | POA: Diagnosis present

## 2020-01-04 DIAGNOSIS — I471 Supraventricular tachycardia: Secondary | ICD-10-CM

## 2020-01-04 DIAGNOSIS — Z7902 Long term (current) use of antithrombotics/antiplatelets: Secondary | ICD-10-CM | POA: Diagnosis not present

## 2020-01-04 DIAGNOSIS — Z955 Presence of coronary angioplasty implant and graft: Secondary | ICD-10-CM

## 2020-01-04 DIAGNOSIS — K7460 Unspecified cirrhosis of liver: Secondary | ICD-10-CM | POA: Diagnosis present

## 2020-01-04 DIAGNOSIS — I69354 Hemiplegia and hemiparesis following cerebral infarction affecting left non-dominant side: Secondary | ICD-10-CM | POA: Diagnosis not present

## 2020-01-04 DIAGNOSIS — E7800 Pure hypercholesterolemia, unspecified: Secondary | ICD-10-CM | POA: Diagnosis present

## 2020-01-04 DIAGNOSIS — I8510 Secondary esophageal varices without bleeding: Secondary | ICD-10-CM | POA: Diagnosis present

## 2020-01-04 DIAGNOSIS — G4733 Obstructive sleep apnea (adult) (pediatric): Secondary | ICD-10-CM

## 2020-01-04 DIAGNOSIS — Z7982 Long term (current) use of aspirin: Secondary | ICD-10-CM | POA: Diagnosis not present

## 2020-01-04 DIAGNOSIS — I6523 Occlusion and stenosis of bilateral carotid arteries: Secondary | ICD-10-CM | POA: Diagnosis present

## 2020-01-04 DIAGNOSIS — E876 Hypokalemia: Secondary | ICD-10-CM | POA: Diagnosis present

## 2020-01-04 DIAGNOSIS — I252 Old myocardial infarction: Secondary | ICD-10-CM | POA: Diagnosis not present

## 2020-01-04 DIAGNOSIS — G40909 Epilepsy, unspecified, not intractable, without status epilepticus: Secondary | ICD-10-CM | POA: Diagnosis present

## 2020-01-04 DIAGNOSIS — I340 Nonrheumatic mitral (valve) insufficiency: Secondary | ICD-10-CM | POA: Diagnosis not present

## 2020-01-04 DIAGNOSIS — I10 Essential (primary) hypertension: Secondary | ICD-10-CM | POA: Diagnosis present

## 2020-01-04 DIAGNOSIS — R29704 NIHSS score 4: Secondary | ICD-10-CM | POA: Diagnosis present

## 2020-01-04 DIAGNOSIS — I350 Nonrheumatic aortic (valve) stenosis: Secondary | ICD-10-CM | POA: Diagnosis not present

## 2020-01-04 LAB — BASIC METABOLIC PANEL
Anion gap: 13 (ref 5–15)
BUN: 8 mg/dL (ref 8–23)
CO2: 26 mmol/L (ref 22–32)
Calcium: 8.5 mg/dL — ABNORMAL LOW (ref 8.9–10.3)
Chloride: 103 mmol/L (ref 98–111)
Creatinine, Ser: 0.71 mg/dL (ref 0.44–1.00)
GFR calc Af Amer: >60 mL/min (ref >60)
GFR calc non Af Amer: >60 mL/min (ref >60)
Glucose, Bld: 100 mg/dL — ABNORMAL HIGH (ref 70–99)
Potassium: 2.5 mmol/L — CL (ref 3.5–5.1)
Sodium: 142 mmol/L (ref 135–145)

## 2020-01-04 LAB — CBC
HCT: 33.4 % — ABNORMAL LOW (ref 36.0–46.0)
Hemoglobin: 11.1 g/dL — ABNORMAL LOW (ref 12.0–15.0)
MCH: 30.8 pg (ref 26.0–34.0)
MCHC: 33.2 g/dL (ref 30.0–36.0)
MCV: 92.8 fL (ref 80.0–100.0)
Platelets: 125 10*3/uL — ABNORMAL LOW (ref 150–400)
RBC: 3.6 MIL/uL — ABNORMAL LOW (ref 3.87–5.11)
RDW: 14.6 % (ref 11.5–15.5)
WBC: 6.7 10*3/uL (ref 4.0–10.5)
nRBC: 0 % (ref 0.0–0.2)

## 2020-01-04 LAB — MAGNESIUM: Magnesium: 1.8 mg/dL (ref 1.7–2.4)

## 2020-01-04 LAB — VITAMIN B12: Vitamin B-12: 754 pg/mL (ref 180–914)

## 2020-01-04 LAB — TSH: TSH: 0.813 u[IU]/mL (ref 0.350–4.500)

## 2020-01-04 MED ORDER — POTASSIUM CHLORIDE 20 MEQ PO PACK
40.0000 meq | PACK | ORAL | Status: DC
  Administered 2020-01-04: 07:00:00 40 meq via ORAL
  Filled 2020-01-04 (×2): qty 2

## 2020-01-04 MED ORDER — MAGNESIUM SULFATE 2 GM/50ML IV SOLN
2.0000 g | Freq: Once | INTRAVENOUS | Status: AC
  Administered 2020-01-04: 11:00:00 2 g via INTRAVENOUS
  Filled 2020-01-04: qty 50

## 2020-01-04 MED ORDER — POTASSIUM CHLORIDE CRYS ER 20 MEQ PO TBCR
40.0000 meq | EXTENDED_RELEASE_TABLET | Freq: Two times a day (BID) | ORAL | Status: AC
  Administered 2020-01-04 (×2): 40 meq via ORAL
  Filled 2020-01-04 (×2): qty 2

## 2020-01-04 MED ORDER — HYDRALAZINE HCL 10 MG PO TABS
10.0000 mg | ORAL_TABLET | Freq: Three times a day (TID) | ORAL | Status: DC
  Administered 2020-01-04 – 2020-01-05 (×3): 10 mg via ORAL
  Filled 2020-01-04 (×4): qty 1

## 2020-01-04 NOTE — Evaluation (Signed)
Occupational Therapy Evaluation Patient Details Name: [name] MRN: [id number] DOB: 1941/04/04 Today's Date: 01/04/2020    History of Present Illness Patient reports that she had a scheduled MRI today, was scheduled 6 months ago.  During the MRI it was noted that patient had findings of few small acute to subacute infarcts involving multiple vascular territories.  Given these findings patient was wheeled to the emergency department. Patient reports she has had no neurological symptoms.  Denies any chest pain, edema, weakness, dizziness, headache.  Did have a fall 2 weeks ago but this was because she tripped and fell. Patient states she otherwise has been at baseline.  Lives with her grandson and granddaughter who help her with ADLs.  Can ambulate a few steps but mainly uses wheelchair.  Is able to do other ADLs independently.   Clinical Impression   Pt agreeable to OT/PT co-evaluation this am. Pt with significant bruising and large hematoma along left cheek and extending down neck, reports a fall approximately 2 weeks ago when a chair moved while she was holding it and she hit a Ecologist. Pt with generalized BUE weakness, coordination and sensation are intact. Pt requiring increased time and assistance with ADLs due to weakness and fatigue. Recommend SNF on discharge to improve safety and independence in ADL completion. No further acute OT services required at this time.     Follow Up Recommendations  SNF    Equipment Recommendations  None recommended by OT       Precautions / Restrictions Precautions Precautions: Fall Restrictions Weight Bearing Restrictions: No      Mobility Bed Mobility               General bed mobility comments: Defer to PT note  Transfers                 General transfer comment: Defer to PT note        ADL either performed or assessed with clinical judgement   ADL Overall ADL's : Needs assistance/impaired Eating/Feeding: Modified  independent;Sitting   Grooming: Wash/dry hands;Set up;Sitting Grooming Details (indicate cue type and reason): provided hand sanitizer for pt after toileting             Lower Body Dressing: Moderate assistance;Bed level Lower Body Dressing Details (indicate cue type and reason): pt able to donn right sock, assist for left Toilet Transfer: Minimal assistance;Stand-pivot;BSC;RW   Toileting- Clothing Manipulation and Hygiene: Maximal assistance;Sit to/from stand Toileting - Clothing Manipulation Details (indicate cue type and reason): Pt requiring assist for doffing depends and performing peri-care in standing.      Functional mobility during ADLs: Min guard;Minimal assistance;Rolling walker       Vision Baseline Vision/History: Wears glasses Wears Glasses: At all times Patient Visual Report: No change from baseline Vision Assessment?: No apparent visual deficits            Pertinent Vitals/Pain Pain Assessment: No/denies pain     Hand Dominance Right   Extremity/Trunk Assessment Upper Extremity Assessment Upper Extremity Assessment: Generalized weakness   Lower Extremity Assessment Lower Extremity Assessment: Defer to PT evaluation   Cervical / Trunk Assessment Cervical / Trunk Assessment: Kyphotic   Communication Communication Communication: No difficulties   Cognition Arousal/Alertness: Awake/alert Behavior During Therapy: WFL for tasks assessed/performed Overall Cognitive Status: Within Functional Limits for tasks assessed  Home Living Family/patient expects to be discharged to:: Private residence Living Arrangements: Alone;Other relatives(granddaughter stays with her at night) Available Help at Discharge: Family;Available PRN/intermittently Type of Home: House Home Access: Stairs to enter [employer] of Steps: goes down 2 then up 2 from carport Entrance Stairs-Rails: Left Home  Layout: One level;Laundry or work area in basement(pt does not go in basement)     Bathroom Shower/Tub: Teacher, early years/pre: Standard     Home Equipment: Environmental consultant - 2 wheels;Bedside commode;Shower seat;Wheelchair - manual          Prior Functioning/Environment Level of Independence: Needs assistance  Gait / Transfers Assistance Needed: pt reports transferring without DME, uses wheelchair for mobility ADL's / Homemaking Assistance Needed: Pt reports independence in ADLs, granddaughter is available to assist at night if needed            OT Problem List: Decreased strength;Decreased activity tolerance;Impaired balance (sitting and/or standing);Decreased safety awareness;Decreased knowledge of use of DME or AE                       Co-evaluation PT/OT/SLP Co-Evaluation/Treatment: Yes Reason for Co-Treatment: Complexity of the patient's impairments (multi-system involvement);To address functional/ADL transfers   OT goals addressed during session: ADL's and self-care;Proper use of Adaptive equipment and DME      AM-PAC OT "6 Clicks" Daily Activity     Outcome Measure Help from another person eating meals?: None Help from another person taking care of personal grooming?: A Little Help from another person toileting, which includes using toliet, bedpan, or urinal?: A Little Help from another person bathing (including washing, rinsing, drying)?: A Little Help from another person to put on and taking off regular upper body clothing?: A Little Help from another person to put on and taking off regular lower body clothing?: A Little 6 Click Score: 19   End of Session Equipment Utilized During Treatment: Rolling walker  Activity Tolerance: Patient tolerated treatment well Patient left: in chair;with call bell/phone within reach;with chair alarm set  OT Visit Diagnosis: Muscle weakness (generalized) (M62.81);History of falling (Z91.81)                Time:  3810-1751 OT Time Calculation (min): 35 min Charges:  OT General Charges $OT Visit: 1 Visit OT Evaluation $OT Eval Low Complexity: Bell, OTR/L  [phone] 01/04/2020, 9:35 AM

## 2020-01-04 NOTE — Evaluation (Signed)
Physical Therapy Evaluation Patient Details Name: [name] MRN: [id number] DOB: 1941-06-16 Today's Date: 01/04/2020   History of Present Illness  Patient reports that she had a scheduled MRI today, was scheduled 6 months ago.  During the MRI it was noted that patient had findings of few small acute to subacute infarcts involving multiple vascular territories.  Given these findings patient was wheeled to the emergency department. Patient reports she has had no neurological symptoms.  Denies any chest pain, edema, weakness, dizziness, headache.  Did have a fall 2 weeks ago but this was because she tripped and fell. Patient states she otherwise has been at baseline.  Lives with her grandson and granddaughter who help her with ADLs.  Can ambulate a few steps but mainly uses wheelchair.  Is able to do other ADLs independently.    Clinical Impression  Patient unsteady on feet, at severe risk for falls, limited to a few steps at bedside during transfers to [facility] and chair, when taking steps has difficulty advancing LLE due to weakness and limited ankle dorsiflexion and had to sit after taking a few steps due to c/o of legs giving way.  Patient tolerated sitting up in chair after therapy - RN notified.  Patient will benefit from continued physical therapy in hospital and recommended venue below to increase strength, balance, endurance for safe ADLs and gait.     Follow Up Recommendations SNF    Equipment Recommendations  None recommended by PT    Recommendations for Other Services       Precautions / Restrictions Precautions Precautions: Fall Restrictions Weight Bearing Restrictions: No      Mobility  Bed Mobility Overal bed mobility: Needs Assistance Bed Mobility: Supine to Sit     Supine to sit: Min guard;Min assist     General bed mobility comments: slow labored movement  Transfers Overall transfer level: Needs assistance Equipment used: Rolling walker (2 wheeled) Transfers:  Sit to/from [facility] Sit to Stand: Min assist Stand pivot transfers: Min assist;Mod assist       General transfer comment: increased time, labored movement  Ambulation/Gait Ambulation/Gait assistance: Mod assist Gait Distance (Feet): 8 Feet Assistive device: Rolling walker (2 wheeled) Gait Pattern/deviations: Decreased step length - right;Decreased step length - left;Decreased stance time - left;Decreased stride length;Decreased dorsiflexion - left Gait velocity: decreased   General Gait Details: limited to 8-9 slow labored steps at bedside due to c/o feeling of legs giving way, difficulty advancing LLE due to weakness and limited ankle dorsiflexion  Stairs            Wheelchair Mobility    Modified Rankin (Stroke Patients Only)       Balance Overall balance assessment: Needs assistance Sitting-balance support: Feet supported;No upper extremity supported Sitting balance-Leahy Scale: Fair Sitting balance - Comments: fair/good seated at EOB   Standing balance support: During functional activity;Bilateral upper extremity supported Standing balance-Leahy Scale: Fair Standing balance comment: using RW                             Pertinent Vitals/Pain Pain Assessment: No/denies pain    Home Living Family/patient expects to be discharged to:: Private residence Living Arrangements: Alone;Other relatives Available Help at Discharge: Family;Available PRN/intermittently Type of Home: House Home Access: Stairs to enter Entrance Stairs-Rails: Left Entrance Stairs-Number of Steps: goes down 2 then up 2 from [place]: One level;Laundry or work area in [place]: Environmental consultant - 2  wheels;Bedside commode;Shower seat;Wheelchair - manual      Prior Function Level of Independence: Needs assistance   Gait / Transfers Assistance Needed: Household ambulator using RW, uses wheelchair for longer distances  ADL's / Homemaking  Assistance Needed: Pt reports independence in ADLs, granddaughter is available to assist at night if needed        Hand Dominance   Dominant Hand: Right    Extremity/Trunk Assessment   Upper Extremity Assessment Upper Extremity Assessment: Defer to OT evaluation    Lower Extremity Assessment Lower Extremity Assessment: Generalized weakness;RLE deficits/detail;LLE deficits/detail RLE Deficits / Details: grossly 4+/5 RLE Sensation: WNL RLE Coordination: WNL LLE Deficits / Details: grossly 3+/5 except ankle dorsiflexion 0/5 with mild spasticity and/or stiffness LLE Sensation: WNL LLE Coordination: decreased fine motor    Cervical / Trunk Assessment Cervical / Trunk Assessment: Kyphotic  Communication   Communication: No difficulties  Cognition Arousal/Alertness: Awake/alert Behavior During Therapy: WFL for tasks assessed/performed Overall Cognitive Status: Within Functional Limits for tasks assessed                                        General Comments      Exercises     Assessment/Plan    PT Assessment Patient needs continued PT services  PT Problem List Decreased strength;Decreased activity tolerance;Decreased balance;Decreased mobility       PT Treatment Interventions Gait training;Functional mobility training;Therapeutic activities;Therapeutic exercise;Stair training;Patient/family education    PT Goals (Current goals can be found in the Care Plan section)  Acute Rehab PT Goals Patient Stated Goal: return home after rehab PT Goal Formulation: With patient Time For Goal Achievement: 01/18/20 Potential to Achieve Goals: Good    Frequency Min 4X/week   Barriers to discharge        Co-evaluation PT/OT/SLP Co-Evaluation/Treatment: Yes Reason for Co-Treatment: Complexity of the patient's impairments (multi-system involvement) PT goals addressed during session: Mobility/safety with mobility;Balance;Proper use of DME         AM-PAC PT  "6 Clicks" Mobility  Outcome Measure Help needed turning from your back to your side while in a flat bed without using bedrails?: A Little Help needed moving from lying on your back to sitting on the side of a flat bed without using bedrails?: A Little Help needed moving to and from a bed to a chair (including a wheelchair)?: A Lot Help needed standing up from a chair using your arms (e.g., wheelchair or bedside chair)?: A Little Help needed to walk in hospital room?: A Lot Help needed climbing 3-5 steps with a railing? : A Lot 6 Click Score: 15    End of Session   Activity Tolerance: Patient tolerated treatment well;Patient limited by fatigue Patient left: in chair;with call bell/phone within reach;with chair alarm set Nurse Communication: Mobility status PT Visit Diagnosis: Unsteadiness on feet (R26.81);Other abnormalities of gait and mobility (R26.89);Muscle weakness (generalized) (M62.81)    Time: 4967-5916 PT Time Calculation (min) (ACUTE ONLY): 30 min   Charges:   PT Evaluation $PT Eval Moderate Complexity: 1 Mod PT Treatments $Therapeutic Activity: 23-37 mins        2:14 PM, 01/04/20 Lonell Grandchild, MPT Physical Therapist with [facility] 336 [phone] office [phone] mobile phone

## 2020-01-04 NOTE — Progress Notes (Signed)
Pt's son, Denny Lave, here with patient. He is requesting to speak with both the MD and Social Worker regarding discharge plans for patient. He is upset because he states that his mother was told today that she was going to be placed in a nursing home and he (as her POA and MPOA) is not agreeable to that. Both MD Wynetta Emery and assigned social worker notified.

## 2020-01-04 NOTE — Progress Notes (Addendum)
CRITICAL VALUE ALERT  Critical Value:  Potassium 2.5  Date & Time Notied:  01/04/20 @ 0605  Provider Notified: Hospitalist  Orders Received/Actions taken: Order for Potassium Chloride oral solution 40 mEq q 4 hours x4 doses.

## 2020-01-04 NOTE — Progress Notes (Signed)
  orthostatics taken BPs noted to be Lying 197/52, sitting 198/48, standing 196/60 and standing at 3 mins 200/55

## 2020-01-04 NOTE — Progress Notes (Addendum)
PROGRESS NOTE   [name]  XTK:[id number] DOB: 1941/03/17 DOA: 01/03/2020 PCP: Redmond School, MD   Chief Complaint  Patient presents with   Abnormal Lab    Brief Narrative:  79 year old female with cerebrovascular disease, coronary artery disease with stenting, hypertension, diabetes mellitus, epilepsy and ambulatory dysfunction presented from neurology office with new MRI brain findings of acute CVA.  Assessment & Plan:   Principal Problem:   Acute CVA (cerebrovascular accident) ([place]) Active Problems:   CAD (coronary artery disease)   Presence of stent in right coronary artery   OSA (obstructive sleep apnea)   HTN (hypertension)   Carotid stenosis, bilateral   PSVT (paroxysmal supraventricular tachycardia) ([place])   Diabetes mellitus without complication ([place])   1. Acute CVA -neurology has seen the patient this morning and very concerned about a cardioembolic source of her stroke.  A loop recorder is being arranged outpatient.  The patient will remain on aspirin and Plavix for secondary prevention.  Please see neurology consult recommendations.  PT OT recommending SNF placement and patient seems agreeable about social worker to assist with placement.  Continue Lipitor 80 mg daily. 2. Essential hypertension-elevated blood pressures noted allowing for some permissive hypertension avoiding aggressive BP lowering.  We will slowly work to lower blood pressure over the course of the next 24 to 48 hours. 3. History of PSVT that she has been resumed on home metoprolol and Cardizem. 4. CAD with history of angioplasty and stent placement-continue metoprolol aspirin Plavix Lipitor and niacin. 5. Gait instability-PT recommending SNF placement 6. Hypokalemia -check magnesium and replace if needed, oral replacement ordered.  Recheck in a.m.   DVT prophylaxis: SCDs and subcu heparin Code Status: Full Family Communication: Telephone call update Disposition:   Status is:  Inpatient  Remains inpatient appropriate because:Hemodynamically unstable   Dispo: The patient is from: Home              Anticipated d/c is to: SNF              Anticipated d/c date is: 2 days              Patient currently is not medically stable to d/c.    Consultants:   Neurology  Cardiology (for loop recorder only)  Procedures:   n/a Antimicrobials: n/a    Subjective: Patient remains very weak and unsteady to ambulate.  Patient continues to have left-sided weakness.  Objective: Vitals:   01/03/20 2036 01/03/20 2300 01/04/20 0321 01/04/20 0656  BP:  (!) 187/72 (!) 161/52 (!) 183/48  Pulse:  80 74 66  Resp:  18 20   Temp:  99.6 F (37.6 C) 98.7 F (37.1 C) 98.8 F (37.1 C)  TempSrc:  Oral Oral Oral  SpO2: 96% 96% 96% 96%  Weight:      Height:        Intake/Output Summary (Last 24 hours) at 01/04/2020 1044 Last data filed at 01/04/2020 0830 Gross per 24 hour  Intake 480 ml  Output 300 ml  Net 180 ml   Filed Weights   01/03/20 1204 01/03/20 1503  Weight: 59 kg 58.6 kg    Examination:  General exam: Appears calm and comfortable  Respiratory system: Clear to auscultation. Respiratory effort normal. Cardiovascular system: S1 & S2 heard, RRR. No JVD, murmurs, rubs, gallops or clicks. No pedal edema. Gastrointestinal system: Abdomen is nondistended, soft and nontender. No organomegaly or masses felt. Normal bowel sounds heard. Central nervous system: Alert and oriented.  Pronounced left hemiparesis. Extremities:  Symmetric 5 x 5 power. Skin: No rashes, lesions or ulcers Psychiatry: Judgement and insight appear normal. Mood & affect appropriate.     Data Reviewed: I have personally reviewed following labs and imaging studies  CBC: Recent Labs  Lab 01/03/20 1304 01/04/20 0436  WBC 6.1 6.7  HGB 12.3 11.1*  HCT 38.4 33.4*  MCV 94.6 92.8  PLT 142* 125*    Basic Metabolic Panel: Recent Labs  Lab 01/03/20 1304 01/04/20 0436  NA 146* 142  K 3.4*  2.5*  CL 107 103  CO2 29 26  GLUCOSE 94 100*  BUN 10 8  CREATININE 0.93 0.71  CALCIUM 8.8* 8.5*  MG  --  1.8    GFR: Estimated Creatinine Clearance: 48.7 mL/min (by C-G formula based on SCr of 0.71 mg/dL).  Liver Function Tests: Recent Labs  Lab 01/03/20 1304  AST 48*  ALT 27  ALKPHOS 122  BILITOT 1.1  PROT 6.9  ALBUMIN 3.7    CBG: No results for input(s): GLUCAP in the last 168 hours.   Recent Results (from the past 240 hour(s))  SARS CORONAVIRUS 2 (TAT 6-24 HRS) Nasopharyngeal Nasopharyngeal Swab     Status: None   Collection Time: 01/03/20  2:25 PM   Specimen: Nasopharyngeal Swab  Result Value Ref Range Status   SARS Coronavirus 2 NEGATIVE NEGATIVE Final    Comment: (NOTE) SARS-CoV-2 target nucleic acids are NOT DETECTED. The SARS-CoV-2 RNA is generally detectable in upper and lower respiratory specimens during the acute phase of infection. Negative results do not preclude SARS-CoV-2 infection, do not rule out co-infections with other pathogens, and should not be used as the sole basis for treatment or other patient management decisions. Negative results must be combined with clinical observations, patient history, and epidemiological information. The expected result is Negative. Fact Sheet for Patients: SugarRoll.be Fact Sheet for Healthcare Providers: https://www.woods-mathews.com/ This test is not yet approved or cleared by the [place] FDA and  has been authorized for detection and/or diagnosis of SARS-CoV-2 by FDA under an Emergency Use Authorization (EUA). This EUA will remain  in effect (meaning this test can be used) for the duration of the COVID-19 declaration under Section 56 4(b)(1) of the Act, 21 U.S.C. section 360bbb-3(b)(1), unless the authorization is terminated or revoked sooner. Performed at [facility], [place] [address], [place], [place] [address]          Radiology Studies: CT  Angio Head W or Wo Contrast  Result Date: 01/03/2020 CLINICAL DATA:  Small strokes on MRI EXAM: CT ANGIOGRAPHY HEAD AND NECK TECHNIQUE: Multidetector CT imaging of the head and neck was performed using the standard protocol during bolus administration of intravenous contrast. Multiplanar CT image reconstructions and MIPs were obtained to evaluate the vascular anatomy. Carotid stenosis measurements (when applicable) are obtained utilizing NASCET criteria, using the distal internal carotid diameter as the denominator. CONTRAST:  96m OMNIPAQUE IOHEXOL 350 MG/ML SOLN COMPARISON:  07/06/2018 FINDINGS: CT HEAD FINDINGS Brain: There is no acute intracranial hemorrhage, mass effect, or edema. Recent small infarcts seen on same day MRI. There is a chronic right MCA territory infarction. Additional chronic small vessel infarcts of the central gray matter and right cerebellum. There is no extra-axial fluid collection. No hydrocephalus. Vascular: There is atherosclerotic calcification at the skull base. Skull: Calvarium is unremarkable. Sinuses/Orbits: No acute finding. Other: None. Review of the MIP images confirms the above findings CTA NECK FINDINGS Aortic arch: There is calcified plaque along the arch and patent great  vessel origins. Right carotid system: Patent. Mild calcified plaque along the common carotid. There is calcified plaque at the ICA origin causing less than 50% stenosis. Left carotid system: Patent. Calcified plaque at the bifurcation causing less than 50% stenosis. Vertebral arteries: Patent and codominant. Skeleton: Multilevel degenerative changes are similar in appearance Other neck: Stable thyroid nodule. A new 2.5 x 1.5 cm area of subcutaneous soft tissue at the left cheek is noted. Upper chest: No apical lung mass. Review of the MIP images confirms the above findings CTA HEAD FINDINGS Anterior circulation: Intracranial internal carotid arteries are patent with calcified plaque but no significant  stenosis. Anterior and middle cerebral arteries are patent. Anterior communicating artery is present. Posterior circulation: Intracranial vertebral arteries are patent with mild calcified plaque. Basilar artery is patent. Posterior cerebral arteries are patent. Stable focal moderate right proximal P2 PCA stenosis. Venous sinuses: As permitted by contrast timing, patent. Review of the MIP images confirms the above findings IMPRESSION: No acute intracranial hemorrhage. Small recent infarcts as seen on same day MRI. No large vessel occlusion. Stable appearance of plaque at the ICA origins causing less than 50% stenosis. Stable moderate right P2 PCA stenosis. New subcutaneous soft tissue at the left cheek, which could reflect a hematoma. Electronically Signed   By: Macy Mis M.D.   On: 01/03/2020 14:40   CT Angio Neck W and/or Wo Contrast  Result Date: 01/03/2020 CLINICAL DATA:  Small strokes on MRI EXAM: CT ANGIOGRAPHY HEAD AND NECK TECHNIQUE: Multidetector CT imaging of the head and neck was performed using the standard protocol during bolus administration of intravenous contrast. Multiplanar CT image reconstructions and MIPs were obtained to evaluate the vascular anatomy. Carotid stenosis measurements (when applicable) are obtained utilizing NASCET criteria, using the distal internal carotid diameter as the denominator. CONTRAST:  61m OMNIPAQUE IOHEXOL 350 MG/ML SOLN COMPARISON:  07/06/2018 FINDINGS: CT HEAD FINDINGS Brain: There is no acute intracranial hemorrhage, mass effect, or edema. Recent small infarcts seen on same day MRI. There is a chronic right MCA territory infarction. Additional chronic small vessel infarcts of the central gray matter and right cerebellum. There is no extra-axial fluid collection. No hydrocephalus. Vascular: There is atherosclerotic calcification at the skull base. Skull: Calvarium is unremarkable. Sinuses/Orbits: No acute finding. Other: None. Review of the MIP images  confirms the above findings CTA NECK FINDINGS Aortic arch: There is calcified plaque along the arch and patent great vessel origins. Right carotid system: Patent. Mild calcified plaque along the common carotid. There is calcified plaque at the ICA origin causing less than 50% stenosis. Left carotid system: Patent. Calcified plaque at the bifurcation causing less than 50% stenosis. Vertebral arteries: Patent and codominant. Skeleton: Multilevel degenerative changes are similar in appearance Other neck: Stable thyroid nodule. A new 2.5 x 1.5 cm area of subcutaneous soft tissue at the left cheek is noted. Upper chest: No apical lung mass. Review of the MIP images confirms the above findings CTA HEAD FINDINGS Anterior circulation: Intracranial internal carotid arteries are patent with calcified plaque but no significant stenosis. Anterior and middle cerebral arteries are patent. Anterior communicating artery is present. Posterior circulation: Intracranial vertebral arteries are patent with mild calcified plaque. Basilar artery is patent. Posterior cerebral arteries are patent. Stable focal moderate right proximal P2 PCA stenosis. Venous sinuses: As permitted by contrast timing, patent. Review of the MIP images confirms the above findings IMPRESSION: No acute intracranial hemorrhage. Small recent infarcts as seen on same day MRI. No large vessel occlusion. Stable  appearance of plaque at the ICA origins causing less than 50% stenosis. Stable moderate right P2 PCA stenosis. New subcutaneous soft tissue at the left cheek, which could reflect a hematoma. Electronically Signed   By: Macy Mis M.D.   On: 01/03/2020 14:40   MR BRAIN WO CONTRAST  Result Date: 01/03/2020 CLINICAL DATA:  Difficulty walking, history of stroke EXAM: MRI HEAD WITHOUT CONTRAST TECHNIQUE: Multiplanar, multiecho pulse sequences of the brain and surrounding structures were obtained without intravenous contrast. COMPARISON:  2019 FINDINGS: Motion  artifact is present. Brain: There are a few small foci of diffusion hyperintensity with mild ADC hypointensity or isointensity including involvement the left cerebellum, right aspect of the corpus callosum splenium, and left basal ganglia region. Chronic infarcts again identified with the largest involving right frontoparietal lobes and insula. There are small infarcts of the right cerebellum and central gray nuclei. There is no intracranial mass or mass effect. There is no hydrocephalus or extra-axial fluid collection. Additional patchy T2 hyperintensity in the supratentorial and pontine white matter is nonspecific may reflect mild chronic microvascular ischemic changes. Vascular: Major vessel flow voids at the skull base are preserved. Skull and upper cervical spine: Normal marrow signal is preserved. Sinuses/Orbits: Paranasal sinuses are aerated. Orbits are unremarkable. Other: Sella is unremarkable.  Trace mastoid fluid opacification. IMPRESSION: Few small acute to subacute infarcts involving multiple vascular territories. Stable chronic findings detailed above. These results were called by telephone at the time of interpretation on 01/03/2020 at 11:45 am to provider [facility] , who verbally acknowledged these results. Patient will be sent to the emergency department for further evaluation. Electronically Signed   By: Macy Mis M.D.   On: 01/03/2020 11:51   Scheduled Meds:  aspirin EC  81 mg Oral Daily   atorvastatin  80 mg Oral Daily   calcium carbonate  1 tablet Oral Q breakfast   clopidogrel  75 mg Oral Q breakfast   diltiazem  120 mg Oral Daily   docusate sodium  100 mg Oral Daily   furosemide  20 mg Oral Daily   heparin  5,000 Units Subcutaneous Q8H   hydrALAZINE  10 mg Oral Q8H   metoprolol succinate  25 mg Oral Daily   multivitamin with minerals  1 tablet Oral Daily   niacin  500 mg Oral Daily   pantoprazole  40 mg Oral Daily   potassium chloride  40 mEq Oral Q4H    sodium chloride flush  3 mL Intravenous Q12H   Continuous Infusions:  sodium chloride       LOS: 0 days    Time spent: 48 mins    Aristides Luckey Wynetta Emery, MD [facility]   To contact the attending provider between 7A-7P or the covering provider during after hours 7P-7A, please log into the web site [email or web address] and access using universal [place] password for that web site. If you do not have the password, please call the hospital operator.  01/04/2020, 10:44 AM

## 2020-01-04 NOTE — Progress Notes (Signed)
Received consult for possible loop recorder placement for mutliple CVAs. We will contact appropriate schedulers at [facility] to have arranged as outpatient. If additional cardiology consultation is required please call us back.    Carlyle Dolly MD

## 2020-01-04 NOTE — TOC Progression Note (Signed)
Transition of Care [facility]) - Progression Note    Patient Details  Name: [name] MRN: [id number] Date of Birth: [birth year]  Transition of Care [facility]) CM/SW Contact  Shade Flood, LCSW Phone Number: 01/04/2020, 2:56 PM  Clinical Narrative:     TOC following. PT recommending SNF. Spoke with pt's son who is pt's legal guardian and he states that they do not want SNF rehab. They would like HH at dc. Discussed HH options with insurance and son agreeable to any agency that will accept.   [facility] has accepted referral. TOC will follow.  Expected Discharge Plan: [place] Barriers to Discharge: Continued Medical Work up  Expected Discharge Plan and Services Expected Discharge Plan: [place]                                               Social Determinants of Health (SDOH) Interventions    Readmission Risk Interventions No flowsheet data found.

## 2020-01-04 NOTE — Consult Note (Addendum)
[place] A. Merlene Laughter, MD     [email or web address]          [name] is an 79 y.o. female.   ASSESSMENT/PLAN: 1. MULTIPLE BILATERAL INFARCTS IN DIFFERENT VASCULAR DISTRIBUTION HIGHLY SUGGESTIVE OF CARDIOEMBOLIC SOURCE:  The patient was recommended to be kept for observation overnight and to evaluate for evidence of atrial fibrillation.  Echoes also obtain. The patient should be evaluated for loop recorder however.  Continuation of dual antiplatelet agents, blood pressure control and blood sugar control is recommended. 2.  Gait impairment subacute likely multifactorial. Consider orthostatics.    This is since 79 year old white female who had a routine MRI ordered because of worsening gait impairment.  There is also complains of right-sided weakness a few weeks ago. The patient reports that while she was undergoing the MRI yesterday she developed abnormal sensations and some headache on the right side. Imaging showed multiple small bilateral infarct and hence patient was admitted for further evaluation. She has had multiple infarcts in the past with workup being mostly unrevealing. She is on dual antiplatelet agents. She reports being compliant with all medications. She fell about 2 weeks ago and sustained facial injuries to the left. She tells me that she typically does not have problems falling however and the does not report dizziness on standing. There are no reports of palpitations or chest pain or shortness of breath. The review of systems otherwise negative.     GENERAL:  This is a pleasant thin female who is doing well at this time. She is in no acute distress.  HEENT:  There is a large hematoma left cheek region. It is mildly sore. No clear evidence of fractures noted. Neck is supple.    ABDOMEN: soft  EXTREMITIES: No edema; There is mild fusion and deformity of the right wrist with evidence of athetoid movements. It is status post fracture in the past.    BACK: This is normal.  SKIN: Normal by inspection.    MENTAL STATUS: Alert and oriented -  Mostly although she stated her age as 80 and the month is May. Speech, language and cognition are generally intact. Judgment and insight normal.   CRANIAL NERVES: Pupils are equal, round and reactive to light and accomodation; extra ocular movements are full, there is no significant nystagmus; visual fields are full; upper and lower facial muscles are normal in strength and symmetric, there is no flattening of the nasolabial folds; tongue is midline; uvula is midline; shoulder elevation is normal.  MOTOR:  Mild weakness of the deltoids bilaterally 4+/5. Right hand grip is also mildly weak 4+/5. Left 5. No drift of the upper extremities. There is significant drift of the left lower extremity but no drift involving the right lower extremity. Left hip flexion 5 and dorsiflexion is 0/5 with increased tone. Right hip flexion and dorsiflexion 5.  COORDINATION: Left finger to nose is normal, right finger to nose is normal, No rest tremor; no intention tremor; no postural tremor; no bradykinesia.  REFLEXES: Deep tendon reflexes are symmetrical and normal.   SENSATION: Normal to light touch, temperature, and pain.    NIH stroke scale 1, 1, 2 total 4.   Blood pressure (!) 183/48, pulse 66, temperature 98.8 F (37.1 C), temperature source Oral, resp. rate 20, height 5' 3"  (1.6 m), weight 58.6 kg, SpO2 96 %.  Past Medical History:  Diagnosis Date  . Anemia   . Aortic insufficiency    Moderate  . Back pain   .  Carotid stenosis, bilateral   . Cirrhosis ([place])   . Closed left femoral fracture ([place]) 04/21/2017  . Coronary artery disease    Stent x 2 RCA 1995, cardiac catheterization 09/2016 showing only mild atherosclerosis  . DDD (degenerative disc disease), lumbar   . Essential hypertension   . Falls   . GERD (gastroesophageal reflux disease)   . Hiatal hernia   . History of stroke   .  Hypercholesteremia   . IBS (irritable bowel syndrome)   . Non-alcoholic fatty liver disease   . OSA (obstructive sleep apnea)    no CPAP  . Pericardial effusion    a. small by echo 2018.  Marland Kitchen PSVT (paroxysmal supraventricular tachycardia) ([place])   . Recurrent UTI   . Seizures ([place])   . Stroke ([place])   . Type 2 diabetes mellitus ([employer])    off medication  . Varices, esophageal ([place])     Past Surgical History:  Procedure Laterality Date  . [place]  . APPENDECTOMY    . BACK SURGERY  1985  . CARDIAC CATHETERIZATION  [phone]   Stent to the proximal RCA after MI   . CARDIAC CATHETERIZATION N/A 09/26/2016   Procedure: Left Heart Cath and Coronary Angiography;  Surgeon: Leonie Man, MD;  Location: [facility];  Service: Cardiovascular;  Laterality: N/A;  . CATARACT EXTRACTION W/PHACO Right 07/04/2014   Procedure: CATARACT EXTRACTION PHACO AND INTRAOCULAR LENS PLACEMENT (IOC);  Surgeon: Elta Guadeloupe T. Gershon Crane, MD;  Location: [facility];  Service: Ophthalmology;  Laterality: Right;  CDE:13.13  . COLONOSCOPY    . DILATION AND CURETTAGE OF UTERUS     x2  . Epi Retinal Membrane Peel Left   . ERCP    . ESOPHAGEAL BANDING N/A 04/01/2013   Procedure: ESOPHAGEAL BANDING;  Surgeon: Rogene Houston, MD;  [facility];  Service: Endoscopy;  Laterality: N/A;  . ESOPHAGEAL BANDING N/A 05/24/2013   Procedure: ESOPHAGEAL BANDING;  Surgeon: Rogene Houston, MD;  [facility];  Service: Endoscopy;  Laterality: N/A;  . ESOPHAGEAL BANDING N/A 06/21/2014   Procedure: ESOPHAGEAL BANDING;  Surgeon: Rogene Houston, MD;  [facility];  Service: Endoscopy;  Laterality: N/A;  . ESOPHAGOGASTRODUODENOSCOPY N/A 04/01/2013   Procedure: ESOPHAGOGASTRODUODENOSCOPY (EGD);  Surgeon: Rogene Houston, MD;  [facility];  Service: Endoscopy;  Laterality: N/A;  230-rescheduled to 8:30am Ann notified pt  . ESOPHAGOGASTRODUODENOSCOPY N/A 05/24/2013   Procedure:  ESOPHAGOGASTRODUODENOSCOPY (EGD);  Surgeon: Rogene Houston, MD;  [facility];  Service: Endoscopy;  Laterality: N/A;  730  . ESOPHAGOGASTRODUODENOSCOPY N/A 06/21/2014   Procedure: ESOPHAGOGASTRODUODENOSCOPY (EGD);  Surgeon: Rogene Houston, MD;  [facility];  Service: Endoscopy;  Laterality: N/A;  930-rescheduled 10/14 @ 1200 Ann to notify pt  . EYE SURGERY  08   cataract surgery of the left eye  . FEMUR IM NAIL Left 03/02/2017   Procedure: INTRAMEDULLARY (IM) NAIL FEMORAL;  Surgeon: Rod Can, MD;  Location: [facility];  Service: Orthopedics;  Laterality: Left;  . HARDWARE REMOVAL Right 01/17/2013   Procedure: REMOVAL OF HARDWARE AND EXCISION ULNAR STYLOID RIGHT WRIST;  Surgeon: Tennis Must, MD;  Location: [facility];  Service: Orthopedics;  Laterality: Right;  . MYRINGOTOMY  2012   both ears  . ORIF FEMUR FRACTURE Left 04/23/2017   Procedure: OPEN REDUCTION INTERNAL FIXATION (ORIF) DISTAL FEMUR FRACTURE (FRACTURE AROUND FEMORAL NAIL);  Surgeon: Altamese [place], MD;  Location: [facility]  OR;  Service: Orthopedics;  Laterality: Left;  . TONSILLECTOMY    . VAGINAL HYSTERECTOMY  1972  . WRIST SURGERY     rt wrist hardwear removal    Family History  Problem Relation Age of Onset  . Diabetes Mother   . Heart failure Father   . Heart failure Maternal Aunt     Social History:  reports that she quit smoking about 2 years ago. Her smoking use included cigarettes. She has a 12.50 pack-year smoking history. She has never used smokeless tobacco. She reports that she does not drink alcohol or use drugs.  Allergies:  Allergies  Allergen Reactions  . Tape Rash and Other (See Comments)    Please use paper tape    Medications: Prior to Admission medications   Medication Sig Start Date End Date Taking? Authorizing Provider  aspirin EC 81 MG tablet Take 81 mg by mouth daily.   Yes Jake Samples, PA-C  aspirin-acetaminophen-caffeine (EXCEDRIN MIGRAINE)  [phone] MG tablet Take 1 tablet by mouth every 6 (six) hours as needed for headache.   Yes [provider]  atorvastatin (LIPITOR) 80 MG tablet Take 1 tablet (80 mg total) by mouth daily. 07/15/18  Yes Angiulli, Lavon Paganini, PA-C  calcium citrate (CALCITRATE - DOSED IN MG ELEMENTAL CALCIUM) 950 MG tablet Take 1 tablet (200 mg of elemental calcium total) by mouth daily. 07/15/18  Yes Angiulli, Lavon Paganini, PA-C  carvedilol (COREG) 12.5 MG tablet Take 1 tablet (12.5 mg total) by mouth 2 (two) times daily with a meal. 07/15/18  Yes Angiulli, Lavon Paganini, PA-C  clopidogrel (PLAVIX) 75 MG tablet Take 1 tablet (75 mg total) by mouth daily with breakfast. 07/15/18  Yes Angiulli, Lavon Paganini, PA-C  diltiazem (TIAZAC) 120 MG 24 hr capsule Take 1 capsule by mouth daily.   Yes [provider]  docusate sodium (COLACE) 100 MG capsule Take 100 mg by mouth daily.    Yes [provider]  furosemide (LASIX) 20 MG tablet Take 20 mg by mouth daily.  12/26/19  Yes [provider]  metoprolol succinate (TOPROL-XL) 25 MG 24 hr tablet Take 25 mg by mouth daily. 12/26/19  Yes [provider]  Multiple Vitamins-Minerals (CENTRUM ADULTS) TABS Take 1 tablet by mouth daily.   Yes [provider]  niacin (NIASPAN) 500 MG CR tablet Take 1 tablet by mouth daily. 06/14/18  Yes [provider]  omeprazole (PRILOSEC) 20 MG capsule Take 1 capsule (20 mg total) by mouth daily. 03/05/18  Yes Setzer, Rona Ravens, NP  oxymetazoline (AFRIN) 0.05 % nasal spray Place 1 spray into both nostrils 2 (two) times daily as needed for congestion.   Yes [provider]  traZODone (DESYREL) 50 MG tablet Take 50 mg by mouth at bedtime as needed.  11/04/18  Yes [provider]  zolpidem (AMBIEN) 10 MG tablet Take 10 mg by mouth at bedtime as needed for sleep.   Yes [provider]    Scheduled Meds: . aspirin EC  81 mg Oral Daily  . atorvastatin  80 mg Oral Daily  . calcium  carbonate  1 tablet Oral Q breakfast  . clopidogrel  75 mg Oral Q breakfast  . diltiazem  120 mg Oral Daily  . docusate sodium  100 mg Oral Daily  . furosemide  20 mg Oral Daily  . heparin  5,000 Units Subcutaneous Q8H  . metoprolol succinate  25 mg Oral Daily  . multivitamin with minerals  1 tablet Oral Daily  .  niacin  500 mg Oral Daily  . pantoprazole  40 mg Oral Daily  . potassium chloride  40 mEq Oral Q4H  . sodium chloride flush  3 mL Intravenous Q12H   Continuous Infusions: . sodium chloride     PRN Meds:.sodium chloride, ondansetron **OR** ondansetron (ZOFRAN) IV, oxymetazoline, polyethylene glycol, sodium chloride flush, traZODone     Results for orders placed or performed during the hospital encounter of 01/03/20 (from the past 48 hour(s))  CBC     Status: Abnormal   Collection Time: 01/03/20  1:04 PM  Result Value Ref Range   WBC 6.1 4.0 - 10.5 K/uL   RBC 4.06 3.87 - 5.11 MIL/uL   Hemoglobin 12.3 12.0 - 15.0 g/dL   HCT 38.4 36.0 - 46.0 %   MCV 94.6 80.0 - 100.0 fL   MCH 30.3 26.0 - 34.0 pg   MCHC 32.0 30.0 - 36.0 g/dL   RDW 14.9 11.5 - 15.5 %   Platelets 142 (L) 150 - 400 K/uL   nRBC 0.0 0.0 - 0.2 %    Comment: Performed at [facility], [address], [place], [place] [address]  Comprehensive metabolic panel     Status: Abnormal   Collection Time: 01/03/20  1:04 PM  Result Value Ref Range   Sodium 146 (H) 135 - 145 mmol/L   Potassium 3.4 (L) 3.5 - 5.1 mmol/L   Chloride 107 98 - 111 mmol/L   CO2 29 22 - 32 mmol/L   Glucose, Bld 94 70 - 99 mg/dL    Comment: Glucose reference range applies only to samples taken after fasting for at least 8 hours.   BUN 10 8 - 23 mg/dL   Creatinine, Ser 0.93 0.44 - 1.00 mg/dL   Calcium 8.8 (L) 8.9 - 10.3 mg/dL   Total Protein 6.9 6.5 - 8.1 g/dL   Albumin 3.7 3.5 - 5.0 g/dL   AST 48 (H) 15 - 41 U/L   ALT 27 0 - 44 U/L   Alkaline Phosphatase 122 38 - 126 U/L   Total Bilirubin 1.1 0.3 - 1.2 mg/dL   GFR calc non Af Amer 59  (L) >60 mL/min   GFR calc Af Amer >60 >60 mL/min   Anion gap 10 5 - 15    Comment: Performed at [facility], [address], [place], [place] [address]  Lipid panel     Status: None   Collection Time: 01/03/20  1:04 PM  Result Value Ref Range   Cholesterol 162 0 - 200 mg/dL   Triglycerides 78 <150 mg/dL   HDL 63 >40 mg/dL   Total CHOL/HDL Ratio 2.6 RATIO   VLDL 16 0 - 40 mg/dL   LDL Cholesterol 83 0 - 99 mg/dL    Comment:        Total Cholesterol/HDL:CHD Risk Coronary Heart Disease Risk Table                     Men   Women  1/2 Average Risk   3.4   3.3  Average Risk       5.0   4.4  2 X Average Risk   9.6   7.1  3 X Average Risk  23.4   11.0        Use the calculated Patient Ratio above and the CHD Risk Table to determine the patient's CHD Risk.        ATP III CLASSIFICATION (LDL):  <100     mg/dL  Optimal  100-129  mg/dL   Near or Above                    Optimal  130-159  mg/dL   Borderline  160-189  mg/dL   High  >190     mg/dL   Very High Performed at [facility], [address], [place], [place] [address]   Hemoglobin A1c     Status: None   Collection Time: 01/03/20  1:04 PM  Result Value Ref Range   Hgb A1c MFr Bld 5.2 4.8 - 5.6 %    Comment: (NOTE) Pre diabetes:          5.7%-6.4% Diabetes:              >6.4% Glycemic control for   <7.0% adults with diabetes    Mean Plasma Glucose 102.54 mg/dL    Comment: Performed at [facility] [address] [place], [place] [address]  TSH     Status: None   Collection Time: 01/03/20  1:04 PM  Result Value Ref Range   TSH 0.504 0.350 - 4.500 uIU/mL    Comment: Performed by a 3rd Generation assay with a functional sensitivity of <=0.01 uIU/mL. Performed at [facility], [address] [place], [place] [address]   SARS CORONAVIRUS 2 (TAT 6-24 HRS) Nasopharyngeal Nasopharyngeal Swab     Status: None   Collection Time: 01/03/20  2:25 PM   Specimen: Nasopharyngeal Swab  Result Value Ref Range   SARS  Coronavirus 2 NEGATIVE NEGATIVE    Comment: (NOTE) SARS-CoV-2 target nucleic acids are NOT DETECTED. The SARS-CoV-2 RNA is generally detectable in upper and lower respiratory specimens during the acute phase of infection. Negative results do not preclude SARS-CoV-2 infection, do not rule out co-infections with other pathogens, and should not be used as the sole basis for treatment or other patient management decisions. Negative results must be combined with clinical observations, patient history, and epidemiological information. The expected result is Negative. Fact Sheet for Patients: SugarRoll.be Fact Sheet for Healthcare Providers: https://www.woods-mathews.com/ This test is not yet approved or cleared by the [place] FDA and  has been authorized for detection and/or diagnosis of SARS-CoV-2 by FDA under an Emergency Use Authorization (EUA). This EUA will remain  in effect (meaning this test can be used) for the duration of the COVID-19 declaration under Section 56 4(b)(1) of the Act, 21 U.S.C. section 360bbb-3(b)(1), unless the authorization is terminated or revoked sooner. Performed at [facility], [place] [address] [place], [place] [address]   Basic metabolic panel     Status: Abnormal   Collection Time: 01/04/20  4:36 AM  Result Value Ref Range   Sodium 142 135 - 145 mmol/L   Potassium 2.5 (LL) 3.5 - 5.1 mmol/L    Comment: CRITICAL RESULT CALLED TO, READ BACK BY AND VERIFIED WITH: JONES,D AT 6:00AM ON 01/04/20 BY FESTERMAN,C    Chloride 103 98 - 111 mmol/L   CO2 26 22 - 32 mmol/L   Glucose, Bld 100 (H) 70 - 99 mg/dL    Comment: Glucose reference range applies only to samples taken after fasting for at least 8 hours.   BUN 8 8 - 23 mg/dL   Creatinine, Ser 0.71 0.44 - 1.00 mg/dL   Calcium 8.5 (L) 8.9 - 10.3 mg/dL   GFR calc non Af Amer >60 >60 mL/min   GFR calc Af Amer >60 >60 mL/min   Anion gap 13 5 -  15    Comment:  Performed at [facility], [address] [place], [place] [address]  CBC     Status: Abnormal   Collection Time: 01/04/20  4:36 AM  Result Value Ref Range   WBC 6.7 4.0 - 10.5 K/uL   RBC 3.60 (L) 3.87 - 5.11 MIL/uL   Hemoglobin 11.1 (L) 12.0 - 15.0 g/dL   HCT 33.4 (L) 36.0 - 46.0 %   MCV 92.8 80.0 - 100.0 fL   MCH 30.8 26.0 - 34.0 pg   MCHC 33.2 30.0 - 36.0 g/dL   RDW 14.6 11.5 - 15.5 %   Platelets 125 (L) 150 - 400 K/uL   nRBC 0.0 0.0 - 0.2 %    Comment: Performed at [facility], [address] [place], [place] [address]    Studies/Results:  BRAIN MRI FINDINGS: Motion artifact is present.  Brain: There are a few small foci of diffusion hyperintensity with mild ADC hypointensity or isointensity including involvement the left cerebellum, right aspect of the corpus callosum splenium, and left basal ganglia region.  Chronic infarcts again identified with the largest involving right frontoparietal lobes and insula. There are small infarcts of the right cerebellum and central gray nuclei. There is no intracranial mass or mass effect. There is no hydrocephalus or extra-axial fluid collection. Additional patchy T2 hyperintensity in the supratentorial and pontine white matter is nonspecific may reflect mild chronic microvascular ischemic changes.  Vascular: Major vessel flow voids at the skull base are preserved.  Skull and upper cervical spine: Normal marrow signal is preserved.  Sinuses/Orbits: Paranasal sinuses are aerated. Orbits are unremarkable.  Other: Sella is unremarkable.  Trace mastoid fluid opacification.  IMPRESSION: Few small acute to subacute infarcts involving multiple vascular territories.  Stable chronic findings detailed above.     HEAD NECK CTA FINDINGS: CT HEAD FINDINGS  Brain: There is no acute intracranial hemorrhage, mass effect, or edema. Recent small infarcts seen on same day MRI. There is a chronic right MCA territory  infarction. Additional chronic small vessel infarcts of the central gray matter and right cerebellum. There is no extra-axial fluid collection. No hydrocephalus.  Vascular: There is atherosclerotic calcification at the skull base.  Skull: Calvarium is unremarkable.  Sinuses/Orbits: No acute finding.  Other: None.  Review of the MIP images confirms the above findings  CTA NECK FINDINGS  Aortic arch: There is calcified plaque along the arch and patent great vessel origins.  Right carotid system: Patent. Mild calcified plaque along the common carotid. There is calcified plaque at the ICA origin causing less than 50% stenosis.  Left carotid system: Patent. Calcified plaque at the bifurcation causing less than 50% stenosis.  Vertebral arteries: Patent and codominant.  Skeleton: Multilevel degenerative changes are similar in appearance  Other neck: Stable thyroid nodule. A new 2.5 x 1.5 cm area of subcutaneous soft tissue at the left cheek is noted.  Upper chest: No apical lung mass.  Review of the MIP images confirms the above findings  CTA HEAD FINDINGS  Anterior circulation: Intracranial internal carotid arteries are patent with calcified plaque but no significant stenosis. Anterior and middle cerebral arteries are patent. Anterior communicating artery is present.  Posterior circulation: Intracranial vertebral arteries are patent with mild calcified plaque. Basilar artery is patent. Posterior cerebral arteries are patent. Stable focal moderate right proximal P2 PCA stenosis.  Venous sinuses: As permitted by contrast timing, patent.  Review of the MIP images confirms the above findings  IMPRESSION: No acute intracranial hemorrhage. Small  recent infarcts as seen on same day MRI.  No large vessel occlusion. Stable appearance of plaque at the ICA origins causing less than 50% stenosis.  Stable moderate right P2 PCA stenosis.  New  subcutaneous soft tissue at the left cheek, which could reflect a hematoma.         BRAIN MRI SCAN IS REVIEWED IN PERSON. THERE ARE SMALL MULTIPLE INFARCTS INVOLVING DIFFERENT A VASCULAR DISTRIBUTION. THERE ARE FEW NOTED IN THE INFERIOR CEREBELLUM ON THE LEFT SIDE. THERE IS ALSO SMALL BIFRONTAL SIGNALS, RIGHT CORPUS CALLOSUM SPLENIUM PORTION AND LEFT CAUDATE NUCLEI.  There is a large right temporal frontal encephalomalacia with surrounding FLAIR signal indicating a large cortical remote infarct. There is also remote infarct involving the right basal ganglia and the left basal ganglia. No hemorrhage is noted.  Jaquavion Mccannon A. Merlene Laughter, M.D.  Diplomate, Tax adviser of Psychiatry and Neurology ( Neurology). 01/04/2020, 7:41 AM

## 2020-01-04 NOTE — Plan of Care (Signed)
  Problem: Acute Rehab PT Goals(only PT should resolve) Goal: Pt Will Go Supine/Side To Sit Outcome: Progressing Flowsheets (Taken 01/04/2020 1415) Pt will go Supine/Side to Sit: with min guard assist Goal: Patient Will Transfer Sit To/From Stand Outcome: Progressing Flowsheets (Taken 01/04/2020 1415) Patient will transfer sit to/from stand:  with min guard assist  with minimal assist Goal: Pt Will Transfer Bed To Chair/Chair To Bed Outcome: Progressing Flowsheets (Taken 01/04/2020 1415) Pt will Transfer Bed to Chair/Chair to Bed:  min guard assist  with min assist Goal: Pt Will Ambulate Outcome: Progressing Flowsheets (Taken 01/04/2020 1415) Pt will Ambulate:  25 feet  with minimal assist  with rolling walker   2:16 PM, 01/04/20 Lonell Grandchild, MPT Physical Therapist with [phone] office [phone] mobile phone

## 2020-01-04 NOTE — Progress Notes (Signed)
Patient still declining cpap at night.

## 2020-01-05 ENCOUNTER — Inpatient Hospital Stay (HOSPITAL_COMMUNITY): Payer: Medicare Other

## 2020-01-05 DIAGNOSIS — I350 Nonrheumatic aortic (valve) stenosis: Secondary | ICD-10-CM | POA: Diagnosis not present

## 2020-01-05 DIAGNOSIS — I340 Nonrheumatic mitral (valve) insufficiency: Secondary | ICD-10-CM | POA: Diagnosis not present

## 2020-01-05 DIAGNOSIS — I351 Nonrheumatic aortic (valve) insufficiency: Secondary | ICD-10-CM | POA: Diagnosis not present

## 2020-01-05 LAB — BASIC METABOLIC PANEL
Anion gap: 8 (ref 5–15)
BUN: 10 mg/dL (ref 8–23)
CO2: 26 mmol/L (ref 22–32)
Calcium: 8.5 mg/dL — ABNORMAL LOW (ref 8.9–10.3)
Chloride: 106 mmol/L (ref 98–111)
Creatinine, Ser: 0.87 mg/dL (ref 0.44–1.00)
GFR calc Af Amer: >60 mL/min (ref >60)
GFR calc non Af Amer: >60 mL/min (ref >60)
Glucose, Bld: 99 mg/dL (ref 70–99)
Potassium: 3.5 mmol/L (ref 3.5–5.1)
Sodium: 140 mmol/L (ref 135–145)

## 2020-01-05 LAB — RPR: RPR Ser Ql: NONREACTIVE

## 2020-01-05 LAB — MAGNESIUM: Magnesium: 2.2 mg/dL (ref 1.7–2.4)

## 2020-01-05 LAB — ECHOCARDIOGRAM COMPLETE
Height: 63 in
Weight: 2067.03 oz

## 2020-01-05 LAB — HOMOCYSTEINE: Homocysteine: 12.4 umol/L (ref 0.0–19.2)

## 2020-01-05 MED ORDER — ZOLPIDEM TARTRATE 5 MG PO TABS: 5.0000 mg | ORAL_TABLET | Freq: Every evening | ORAL | 0 refills | Status: DC | PRN

## 2020-01-05 NOTE — Progress Notes (Signed)
Nsg Discharge Note  Admit Date:  01/03/2020 Discharge date: 01/05/2020   [facility] to be D/C'd Home per MD order.  AVS completed.  Copy for chart, and copy for patient signed, and dated. Removed IV-clean, dry, intact. Reviewed d/c paperwork with patient and son. Answered all questions. Son wheeled patient to main entrance to d/c to home.  Patient/caregiver able to verbalize understanding.  Discharge Medication: Allergies as of 01/05/2020      Reactions   Tape Rash, Other (See Comments)   Please use paper tape      Medication List    STOP taking these medications   carvedilol 12.5 MG tablet Commonly known as: COREG   oxymetazoline 0.05 % nasal spray Commonly known as: AFRIN   traZODone 50 MG tablet Commonly known as: DESYREL     TAKE these medications   aspirin EC 81 MG tablet Take 81 mg by mouth daily.   aspirin-acetaminophen-caffeine 250-250-65 MG tablet Commonly known as: EXCEDRIN MIGRAINE Take 1 tablet by mouth every 6 (six) hours as needed for headache.   atorvastatin 80 MG tablet Commonly known as: LIPITOR Take 1 tablet (80 mg total) by mouth daily.   calcium citrate 950 (200 Ca) MG tablet Commonly known as: CALCITRATE - dosed in mg elemental calcium Take 1 tablet (200 mg of elemental calcium total) by mouth daily.   Centrum Adults Tabs Take 1 tablet by mouth daily.   clopidogrel 75 MG tablet Commonly known as: PLAVIX Take 1 tablet (75 mg total) by mouth daily with breakfast.   diltiazem 120 MG 24 hr capsule Commonly known as: TIAZAC Take 1 capsule by mouth daily.   docusate sodium 100 MG capsule Commonly known as: COLACE Take 100 mg by mouth daily.   furosemide 20 MG tablet Commonly known as: LASIX Take 20 mg by mouth daily.   metoprolol succinate 25 MG 24 hr tablet Commonly known as: TOPROL-XL Take 25 mg by mouth daily.   niacin 500 MG CR tablet Commonly known as: NIASPAN Take 1 tablet by mouth daily.   omeprazole 20 MG capsule Commonly  known as: PRILOSEC Take 1 capsule (20 mg total) by mouth daily.   zolpidem 5 MG tablet Commonly known as: AMBIEN Take 1 tablet (5 mg total) by mouth at bedtime as needed for sleep. What changed:   medication strength  how much to take       Discharge Assessment: Vitals:   01/05/20 0519 01/05/20 0544  BP: (!) 113/35 (!) 160/72  Pulse: 68   Resp: 16   Temp: 98.2 F (36.8 C)   SpO2: 95%    Skin clean, dry and intact without evidence of skin break down, no evidence of skin tears noted. IV catheter discontinued intact. Site without signs and symptoms of complications - no redness or edema noted at insertion site, patient denies c/o pain - only slight tenderness at site.  Dressing with slight pressure applied.  D/c Instructions-Education: Discharge instructions given to patient/family with verbalized understanding. D/c education completed with patient/family including follow up instructions, medication list, d/c activities limitations if indicated, with other d/c instructions as indicated by MD - patient able to verbalize understanding, all questions fully answered. Patient instructed to return to ED, call 911, or call MD for any changes in condition.  Patient escorted via [place], and D/C home via private auto.  Santa Lighter, RN 01/05/2020 2:06 PM

## 2020-01-05 NOTE — Plan of Care (Signed)
  Problem: Education: Goal: Knowledge of disease or condition will improve Outcome: Progressing Goal: Knowledge of secondary prevention will improve Outcome: Progressing Goal: Knowledge of patient specific risk factors addressed and post discharge goals established will improve Outcome: Progressing   Problem: Coping: Goal: Will verbalize positive feelings about self Outcome: Progressing Goal: Will identify appropriate support needs Outcome: Progressing   Problem: Health Behavior/Discharge Planning: Goal: Ability to manage health-related needs will improve Outcome: Progressing   Problem: Ischemic Stroke/TIA Tissue Perfusion: Goal: Complications of ischemic stroke/TIA will be minimized Outcome: Progressing

## 2020-01-05 NOTE — Progress Notes (Signed)
Physical Therapy Treatment Patient Details Name: [name] MRN: [id number] DOB: 07/28/1941 Today's Date: 01/05/2020    History of Present Illness Patient reports that she had a scheduled MRI 4/28, was scheduled 6 months ago.  During the MRI it was noted that patient had findings of few small acute to subacute infarcts involving multiple vascular territories.  Given these findings patient was wheeled to the emergency department. Patient reports she has had no neurological symptoms.  Denies any chest pain, edema, weakness, dizziness, headache.  Did have a fall 2 weeks ago but this was because she tripped and fell. Patient states she otherwise has been at baseline.  Lives with her grandson and granddaughter who help her with ADLs.  Can ambulate a few steps but mainly uses wheelchair.  Is able to do other ADLs independently.    PT Comments     Pt eager to work with therapy today.  Less assistance needed with all transfers and general mobility today.  Most weakness in Lt dorsiflexor with cues for heel to toe gait with ambulation.  Walked further today at 40 feet using RW.  Stood at sink and brushed teeth/washed hands without therapist assist and with good balance.    Follow Up Recommendations  SNF     Equipment Recommendations  None recommended by PT           Mobility  Bed Mobility Overal bed mobility: Needs Assistance Bed Mobility: Supine to Sit     Supine to sit: Modified independent (Device/Increase time)        Transfers Overall transfer level: Needs assistance Equipment used: Rolling walker (2 wheeled) Transfers: Sit to/from [facility] Sit to Stand: Min guard            Ambulation/Gait Ambulation/Gait assistance: Herbalist (Feet): 40 Feet Assistive device: Rolling walker (2 wheeled) Gait Pattern/deviations: Decreased step length - right;Decreased step length - left;Decreased stance time - left;Decreased stride length;Decreased  dorsiflexion - left Gait velocity: decreased   General Gait Details: pt with cues for Lt foot clearance due to foot drop        Cognition Arousal/Alertness: Awake/alert Behavior During Therapy: WFL for tasks assessed/performed Overall Cognitive Status: Within Functional Limits for tasks assessed                                        Exercises General Exercises - Lower Extremity Ankle Circles/Pumps: AAROM;Strengthening;Right;Left;10 reps;AROM Long Arc Quad: AROM;Both;10 reps;Seated Hip ABduction/ADduction: AROM;Both;10 reps;Supine Straight Leg Raises: AROM;Both;10 reps Toe Raises: AAROM;AROM;Both;10 reps        Pertinent Vitals/Pain Pain Assessment: No/denies pain           PT Goals (current goals can now be found in the care plan section) Progress towards PT goals: Progressing toward goals    Frequency    Min 4X/week      PT Plan  progress towards goals       AM-PAC PT "6 Clicks" Mobility   Outcome Measure  Help needed turning from your back to your side while in a flat bed without using bedrails?: A Little Help needed moving from lying on your back to sitting on the side of a flat bed without using bedrails?: A Little Help needed moving to and from a bed to a chair (including a wheelchair)?: A Little Help needed standing up from a chair using your arms (e.g., wheelchair or bedside  chair)?: A Little Help needed to walk in hospital room?: A Little Help needed climbing 3-5 steps with a railing? : A Lot 6 Click Score: 17    End of Session   Activity Tolerance: Patient tolerated treatment well;Patient limited by fatigue Patient left: in chair;with call bell/phone within reach;with chair alarm set Nurse Communication: Mobility status PT Visit Diagnosis: Unsteadiness on feet (R26.81);Other abnormalities of gait and mobility (R26.89);Muscle weakness (generalized) (M62.81)     Time: 2419-5424 PT Time Calculation (min) (ACUTE ONLY): 30  min  Charges:  $Gait Training: 8-22 mins $Therapeutic Exercise: 8-22 mins                     Teena Irani, PTA/CLT [place], Hadlyn Amero B 01/05/2020, 11:14 AM

## 2020-01-05 NOTE — TOC Transition Note (Signed)
Transition of Care [facility]) - CM/SW Discharge Note   Patient Details  Name: [name] MRN: [id number] Date of Birth: 05/09/1941  [facility]) CM/SW Contact:  Shade Flood, LCSW Phone Number: 01/05/2020, 12:07 PM   Clinical Narrative:     Pt stable for dc home today per MD. Plan remains for [facility] to provide [facility] services to pt at home. Updated Linda at [place] of pt's dc. There are no other TOC needs for dc.  Final next level of care: [place] Barriers to Discharge: Barriers Resolved   Patient Goals and CMS Choice Patient states their goals for this hospitalization and ongoing recovery are:: Return home CMS Medicare.gov Compare Post Acute Care list provided to:: Patient Represenative (must comment)(Robert Lundin (son) is patient's legal guardian) Choice offered to / list presented to : Adult Children  Discharge Placement                       Discharge Plan and Services     Post Acute Care Choice: Home Health                    HH Arranged: PT, OT [facility] Agency: [place] ([facility]) Date [place]: 01/04/20 Time [address]: 1501 Representative spoke with at [place]: [place] Determinants of Health ([place]) Interventions     Readmission Risk Interventions No flowsheet data found.

## 2020-01-05 NOTE — Discharge Summary (Signed)
Physician Discharge Summary  [name] QIW:[id number] DOB: October 17, 1940 DOA: 01/03/2020  PCP: Redmond School, MD  Admit date: 01/03/2020 Discharge date: 01/05/2020  Admitted From:  Home  Disposition: Declines SNF,  Home with [facility]  Recommendations for Outpatient Follow-up:  1. Follow up with PCP in 1 weeks 2. Follow up with Dr. Lovena Le for loop recorder as scheduled 3. Follow up with Dr. Merlene Laughter for stroke follow up in 3-4 weeks  Home Health: PT, RN  Discharge Condition: STABLE   CODE STATUS: FULL    Brief Hospitalization Summary: Please see all hospital notes, images, labs for full details of the hospitalization. ADMISSION HPI:  [name]  is a 79 y.o. female right-handed female with history of aortic insufficiency, CAD with stenting,HTN, DM2 seizure disorder, with ambulatory dysfunction uses walker for short distances,Uses a wheelchair for longer distances who presented for an outpatient follow-up MRI as ordered by her provider and MRI brain showed new strokes -EDP discussed case with patient's neurologist Dr. Merlene Laughter requested hospitalization overnight for observation -Patient has no new complaints -Patient denies any new unilateral weakness or speech problems or swallowing difficulties -Patient admits to recent fall with left zygomatic area swelling --MRI brain with Few small acute to subacute infarcts involving multiple vascular Territories. --Patient being admitted for further stroke work-up -CTA head and neck without LVO or other new findings -TSH 0.5, A1c is 5.2, LDL is 83 with HDL of 63  Brief Narrative:  79 year old female with cerebrovascular disease, coronary artery disease with stenting, hypertension, diabetes mellitus, epilepsy and ambulatory dysfunction presented from neurology office with new MRI brain findings of acute CVA.  Assessment & Plan:   Principal Problem:   Acute CVA (cerebrovascular accident)  Active Problems:   CAD (coronary artery disease)    Presence of stent in right coronary artery   OSA (obstructive sleep apnea)   HTN (hypertension)   Carotid stenosis, bilateral   PSVT (paroxysmal supraventricular tachycardia)   Diabetes mellitus without complication   1. Acute CVA -neurology has seen the patient this morning and very concerned about a cardioembolic source of her stroke.  A loop recorder is being arranged outpatient with Dr. Cristopher Peru.  The patient will remain on aspirin and Plavix for secondary prevention.  Please see neurology consult recommendations.  PT OT recommending SNF placement and patient and son adamantly refuse, will DC home with HH.  Continue Lipitor 80 mg daily. 2. Essential hypertension-resume all home meds.  3. History of PSVT that she has been resumed on home metoprolol and Cardizem. 4. CAD with history of angioplasty and stent placement-continue metoprolol aspirin Plavix Lipitor and niacin. 5. Gait instability-PT recommending SNF placement however patient and son refusing.  6. Hypokalemia -repleted  DVT prophylaxis: SCDs and subcu heparin Code Status: Full Family Communication: Telephone call update Disposition:  Home with [facility] refusing SNF  Discharge Diagnoses:  Principal Problem:   Acute CVA (cerebrovascular accident) ([place]) Active Problems:   CAD (coronary artery disease)   Presence of stent in right coronary artery   OSA (obstructive sleep apnea)   HTN (hypertension)   Carotid stenosis, bilateral   PSVT (paroxysmal supraventricular tachycardia) ([place])   Diabetes mellitus without complication [facility])   Discharge Instructions:  Allergies as of 01/05/2020      Reactions   Tape Rash, Other (See Comments)   Please use paper tape      Medication List    STOP taking these medications   carvedilol 12.5 MG tablet Commonly known as: COREG   oxymetazoline 0.05 %  nasal spray Commonly known as: AFRIN   traZODone 50 MG tablet Commonly known as: DESYREL     TAKE these medications   aspirin  EC 81 MG tablet Take 81 mg by mouth daily.   aspirin-acetaminophen-caffeine 250-250-65 MG tablet Commonly known as: EXCEDRIN MIGRAINE Take 1 tablet by mouth every 6 (six) hours as needed for headache.   atorvastatin 80 MG tablet Commonly known as: LIPITOR Take 1 tablet (80 mg total) by mouth daily.   calcium citrate 950 (200 Ca) MG tablet Commonly known as: CALCITRATE - dosed in mg elemental calcium Take 1 tablet (200 mg of elemental calcium total) by mouth daily.   Centrum Adults Tabs Take 1 tablet by mouth daily.   clopidogrel 75 MG tablet Commonly known as: PLAVIX Take 1 tablet (75 mg total) by mouth daily with breakfast.   diltiazem 120 MG 24 hr capsule Commonly known as: TIAZAC Take 1 capsule by mouth daily.   docusate sodium 100 MG capsule Commonly known as: COLACE Take 100 mg by mouth daily.   furosemide 20 MG tablet Commonly known as: LASIX Take 20 mg by mouth daily.   metoprolol succinate 25 MG 24 hr tablet Commonly known as: TOPROL-XL Take 25 mg by mouth daily.   niacin 500 MG CR tablet Commonly known as: NIASPAN Take 1 tablet by mouth daily.   omeprazole 20 MG capsule Commonly known as: PRILOSEC Take 1 capsule (20 mg total) by mouth daily.   zolpidem 5 MG tablet Commonly known as: AMBIEN Take 1 tablet (5 mg total) by mouth at bedtime as needed for sleep. What changed:   medication strength  how much to take      Follow-up Information    Evans Lance, MD Follow up on 02/08/2020.   Specialty: Cardiology Why: Cardiology Appointment to Discuss Loop Recorder Placement on 02/08/2020 at 12:00. Appointment is with Dr. Lovena Le, an electrophysiologist who works with Dr. Harl Bowie as well.  Contact information: [address] [place] [address] [facility] Follow up.   Specialty: [place] Why: [place] staff will call you to schedule therapy visits       Phillips Odor, MD. Schedule an appointment as soon as possible for a visit in 3 week(s).   Specialty: Neurology Why: Stroke Follow Up  Contact information: [address] [place] [address] [phone]        Redmond School, MD. Schedule an appointment as soon as possible for a visit in 1 week(s).   Specialty: Internal Medicine Why: Hospital Follow Up  Contact information: [place] [address] [phone]        Arnoldo Lenis, MD .   Specialty: Cardiology Contact information: [place] [address] [phone]          Allergies  Allergen Reactions  . Tape Rash and Other (See Comments)    Please use paper tape   Allergies as of 01/05/2020      Reactions   Tape Rash, Other (See Comments)   Please use paper tape      Medication List    STOP taking these medications   carvedilol 12.5 MG tablet Commonly known as: COREG   oxymetazoline 0.05 % nasal spray Commonly known as: AFRIN   traZODone 50 MG tablet Commonly known as: DESYREL     TAKE these medications   aspirin EC 81 MG tablet Take 81  mg by mouth daily.   aspirin-acetaminophen-caffeine 250-250-65 MG tablet Commonly known as: EXCEDRIN MIGRAINE Take 1 tablet by mouth every 6 (six) hours as needed for headache.   atorvastatin 80 MG tablet Commonly known as: LIPITOR Take 1 tablet (80 mg total) by mouth daily.   calcium citrate 950 (200 Ca) MG tablet Commonly known as: CALCITRATE - dosed in mg elemental calcium Take 1 tablet (200 mg of elemental calcium total) by mouth daily.   Centrum Adults Tabs Take 1 tablet by mouth daily.   clopidogrel 75 MG tablet Commonly known as: PLAVIX Take 1 tablet (75 mg total) by mouth daily with breakfast.   diltiazem 120 MG 24 hr capsule Commonly known as: TIAZAC Take 1 capsule by mouth daily.   docusate sodium 100 MG capsule Commonly known as: COLACE Take 100 mg by mouth daily.   furosemide 20 MG  tablet Commonly known as: LASIX Take 20 mg by mouth daily.   metoprolol succinate 25 MG 24 hr tablet Commonly known as: TOPROL-XL Take 25 mg by mouth daily.   niacin 500 MG CR tablet Commonly known as: NIASPAN Take 1 tablet by mouth daily.   omeprazole 20 MG capsule Commonly known as: PRILOSEC Take 1 capsule (20 mg total) by mouth daily.   zolpidem 5 MG tablet Commonly known as: AMBIEN Take 1 tablet (5 mg total) by mouth at bedtime as needed for sleep. What changed:   medication strength  how much to take       Procedures/Studies: CT Angio Head W or Wo Contrast  Result Date: 01/03/2020 CLINICAL DATA:  Small strokes on MRI EXAM: CT ANGIOGRAPHY HEAD AND NECK TECHNIQUE: Multidetector CT imaging of the head and neck was performed using the standard protocol during bolus administration of intravenous contrast. Multiplanar CT image reconstructions and MIPs were obtained to evaluate the vascular anatomy. Carotid stenosis measurements (when applicable) are obtained utilizing NASCET criteria, using the distal internal carotid diameter as the denominator. CONTRAST:  71m OMNIPAQUE IOHEXOL 350 MG/ML SOLN COMPARISON:  07/06/2018 FINDINGS: CT HEAD FINDINGS Brain: There is no acute intracranial hemorrhage, mass effect, or edema. Recent small infarcts seen on same day MRI. There is a chronic right MCA territory infarction. Additional chronic small vessel infarcts of the central gray matter and right cerebellum. There is no extra-axial fluid collection. No hydrocephalus. Vascular: There is atherosclerotic calcification at the skull base. Skull: Calvarium is unremarkable. Sinuses/Orbits: No acute finding. Other: None. Review of the MIP images confirms the above findings CTA NECK FINDINGS Aortic arch: There is calcified plaque along the arch and patent great vessel origins. Right carotid system: Patent. Mild calcified plaque along the common carotid. There is calcified plaque at the ICA origin causing  less than 50% stenosis. Left carotid system: Patent. Calcified plaque at the bifurcation causing less than 50% stenosis. Vertebral arteries: Patent and codominant. Skeleton: Multilevel degenerative changes are similar in appearance Other neck: Stable thyroid nodule. A new 2.5 x 1.5 cm area of subcutaneous soft tissue at the left cheek is noted. Upper chest: No apical lung mass. Review of the MIP images confirms the above findings CTA HEAD FINDINGS Anterior circulation: Intracranial internal carotid arteries are patent with calcified plaque but no significant stenosis. Anterior and middle cerebral arteries are patent. Anterior communicating artery is present. Posterior circulation: Intracranial vertebral arteries are patent with mild calcified plaque. Basilar artery is patent. Posterior cerebral arteries are patent. Stable focal moderate right proximal P2 PCA stenosis. Venous sinuses: As permitted by contrast timing, patent. Review  of the MIP images confirms the above findings IMPRESSION: No acute intracranial hemorrhage. Small recent infarcts as seen on same day MRI. No large vessel occlusion. Stable appearance of plaque at the ICA origins causing less than 50% stenosis. Stable moderate right P2 PCA stenosis. New subcutaneous soft tissue at the left cheek, which could reflect a hematoma. Electronically Signed   By: Macy Mis M.D.   On: 01/03/2020 14:40   CT Angio Neck W and/or Wo Contrast  Result Date: 01/03/2020 CLINICAL DATA:  Small strokes on MRI EXAM: CT ANGIOGRAPHY HEAD AND NECK TECHNIQUE: Multidetector CT imaging of the head and neck was performed using the standard protocol during bolus administration of intravenous contrast. Multiplanar CT image reconstructions and MIPs were obtained to evaluate the vascular anatomy. Carotid stenosis measurements (when applicable) are obtained utilizing NASCET criteria, using the distal internal carotid diameter as the denominator. CONTRAST:  73m OMNIPAQUE IOHEXOL  350 MG/ML SOLN COMPARISON:  07/06/2018 FINDINGS: CT HEAD FINDINGS Brain: There is no acute intracranial hemorrhage, mass effect, or edema. Recent small infarcts seen on same day MRI. There is a chronic right MCA territory infarction. Additional chronic small vessel infarcts of the central gray matter and right cerebellum. There is no extra-axial fluid collection. No hydrocephalus. Vascular: There is atherosclerotic calcification at the skull base. Skull: Calvarium is unremarkable. Sinuses/Orbits: No acute finding. Other: None. Review of the MIP images confirms the above findings CTA NECK FINDINGS Aortic arch: There is calcified plaque along the arch and patent great vessel origins. Right carotid system: Patent. Mild calcified plaque along the common carotid. There is calcified plaque at the ICA origin causing less than 50% stenosis. Left carotid system: Patent. Calcified plaque at the bifurcation causing less than 50% stenosis. Vertebral arteries: Patent and codominant. Skeleton: Multilevel degenerative changes are similar in appearance Other neck: Stable thyroid nodule. A new 2.5 x 1.5 cm area of subcutaneous soft tissue at the left cheek is noted. Upper chest: No apical lung mass. Review of the MIP images confirms the above findings CTA HEAD FINDINGS Anterior circulation: Intracranial internal carotid arteries are patent with calcified plaque but no significant stenosis. Anterior and middle cerebral arteries are patent. Anterior communicating artery is present. Posterior circulation: Intracranial vertebral arteries are patent with mild calcified plaque. Basilar artery is patent. Posterior cerebral arteries are patent. Stable focal moderate right proximal P2 PCA stenosis. Venous sinuses: As permitted by contrast timing, patent. Review of the MIP images confirms the above findings IMPRESSION: No acute intracranial hemorrhage. Small recent infarcts as seen on same day MRI. No large vessel occlusion. Stable appearance  of plaque at the ICA origins causing less than 50% stenosis. Stable moderate right P2 PCA stenosis. New subcutaneous soft tissue at the left cheek, which could reflect a hematoma. Electronically Signed   By: PMacy MisM.D.   On: 01/03/2020 14:40   MR BRAIN WO CONTRAST  Result Date: 01/03/2020 CLINICAL DATA:  Difficulty walking, history of stroke EXAM: MRI HEAD WITHOUT CONTRAST TECHNIQUE: Multiplanar, multiecho pulse sequences of the brain and surrounding structures were obtained without intravenous contrast. COMPARISON:  2019 FINDINGS: Motion artifact is present. Brain: There are a few small foci of diffusion hyperintensity with mild ADC hypointensity or isointensity including involvement the left cerebellum, right aspect of the corpus callosum splenium, and left basal ganglia region. Chronic infarcts again identified with the largest involving right frontoparietal lobes and insula. There are small infarcts of the right cerebellum and central gray nuclei. There is no intracranial mass or mass  effect. There is no hydrocephalus or extra-axial fluid collection. Additional patchy T2 hyperintensity in the supratentorial and pontine white matter is nonspecific may reflect mild chronic microvascular ischemic changes. Vascular: Major vessel flow voids at the skull base are preserved. Skull and upper cervical spine: Normal marrow signal is preserved. Sinuses/Orbits: Paranasal sinuses are aerated. Orbits are unremarkable. Other: Sella is unremarkable.  Trace mastoid fluid opacification. IMPRESSION: Few small acute to subacute infarcts involving multiple vascular territories. Stable chronic findings detailed above. These results were called by telephone at the time of interpretation on 01/03/2020 at 11:45 am to provider [facility] , who verbally acknowledged these results. Patient will be sent to the emergency department for further evaluation. Electronically Signed   By: Macy Mis M.D.   On: 01/03/2020 11:51       Subjective: Pt says she feels a lot better today.  She refuses SNF. She will go home.    Discharge Exam: Vitals:   01/05/20 0519 01/05/20 0544  BP: (!) 113/35 (!) 160/72  Pulse: 68   Resp: 16   Temp: 98.2 F (36.8 C)   SpO2: 95%    Vitals:   01/04/20 2140 01/05/20 0033 01/05/20 0519 01/05/20 0544  BP: (!) 140/49 (!) 173/52 (!) 113/35 (!) 160/72  Pulse: 74 78 68   Resp: 18  16   Temp: 99.3 F (37.4 C) 98.3 F (36.8 C) 98.2 F (36.8 C)   TempSrc: Oral     SpO2: 97% 96% 95%   Weight:      Height:       General exam: Appears calm and comfortable  Respiratory system: Clear to auscultation. Respiratory effort normal. Cardiovascular system: S1 & S2 heard, RRR. No JVD, murmurs, rubs, gallops or clicks. No pedal edema. Gastrointestinal system: Abdomen is nondistended, soft and nontender. No organomegaly or masses felt. Normal bowel sounds heard. Central nervous system: Alert and oriented.  unchanged left hemiparesis. Extremities: Symmetric 5 x 5 power. Skin: No rashes, lesions or ulcers Psychiatry: Judgement and insight appear normal. Mood & affect appropriate.   The results of significant diagnostics from this hospitalization (including imaging, microbiology, ancillary and laboratory) are listed below for reference.     Microbiology: Recent Results (from the past 240 hour(s))  SARS CORONAVIRUS 2 (TAT 6-24 HRS) Nasopharyngeal Nasopharyngeal Swab     Status: None   Collection Time: 01/03/20  2:25 PM   Specimen: Nasopharyngeal Swab  Result Value Ref Range Status   SARS Coronavirus 2 NEGATIVE NEGATIVE Final    Comment: (NOTE) SARS-CoV-2 target nucleic acids are NOT DETECTED. The SARS-CoV-2 RNA is generally detectable in upper and lower respiratory specimens during the acute phase of infection. Negative results do not preclude SARS-CoV-2 infection, do not rule out co-infections with other pathogens, and should not be used as the sole basis for treatment or other  patient management decisions. Negative results must be combined with clinical observations, patient history, and epidemiological information. The expected result is Negative. Fact Sheet for Patients: SugarRoll.be Fact Sheet for Healthcare Providers: https://www.woods-mathews.com/ This test is not yet approved or cleared by the [place] FDA and  has been authorized for detection and/or diagnosis of SARS-CoV-2 by FDA under an Emergency Use Authorization (EUA). This EUA will remain  in effect (meaning this test can be used) for the duration of the COVID-19 declaration under Section 56 4(b)(1) of the Act, 21 U.S.C. section 360bbb-3(b)(1), unless the authorization is terminated or revoked sooner. Performed at [facility], [place],  [place] [address]      Labs: BNP (last 3 results) No results for input(s): BNP in the last 8760 hours. Basic Metabolic Panel: Recent Labs  Lab 01/03/20 1304 01/04/20 0436 01/05/20 0243  NA 146* 142 140  K 3.4* 2.5* 3.5  CL 107 103 106  CO2 29 26 26   GLUCOSE 94 100* 99  BUN 10 8 10   CREATININE 0.93 0.71 0.87  CALCIUM 8.8* 8.5* 8.5*  MG  --  1.8 2.2   Liver Function Tests: Recent Labs  Lab 01/03/20 1304  AST 48*  ALT 27  ALKPHOS 122  BILITOT 1.1  PROT 6.9  ALBUMIN 3.7   No results for input(s): LIPASE, AMYLASE in the last 168 hours. No results for input(s): AMMONIA in the last 168 hours. CBC: Recent Labs  Lab 01/03/20 1304 01/04/20 0436  WBC 6.1 6.7  HGB 12.3 11.1*  HCT 38.4 33.4*  MCV 94.6 92.8  PLT 142* 125*   Cardiac Enzymes: No results for input(s): CKTOTAL, CKMB, CKMBINDEX, TROPONINI in the last 168 hours. BNP: Invalid input(s): POCBNP CBG: No results for input(s): GLUCAP in the last 168 hours. D-Dimer No results for input(s): DDIMER in the last 72 hours. Hgb A1c Recent Labs    01/03/20 1304  HGBA1C 5.2   Lipid Profile Recent Labs     01/03/20 1304  CHOL 162  HDL 63  LDLCALC 83  TRIG 78  CHOLHDL 2.6   Thyroid function studies Recent Labs    01/04/20 1058  TSH 0.813   Anemia work up Recent Labs    01/04/20 1058  VITAMINB12 754   Urinalysis    Component Value Date/Time   COLORURINE YELLOW 11/04/2019 [place] 11/04/2019 1456   LABSPEC 1.008 11/04/2019 1456   PHURINE 7.0 11/04/2019 1456   GLUCOSEU NEGATIVE 11/04/2019 1456   HGBUR SMALL (A) 11/04/2019 1456   BILIRUBINUR NEGATIVE 11/04/2019 1456   KETONESUR NEGATIVE 11/04/2019 1456   PROTEINUR NEGATIVE 11/04/2019 1456   UROBILINOGEN 0.2 07/09/2015 0930   NITRITE NEGATIVE 11/04/2019 1456   LEUKOCYTESUR NEGATIVE 11/04/2019 1456   Sepsis Labs Invalid input(s): PROCALCITONIN,  WBC,  LACTICIDVEN Microbiology Recent Results (from the past 240 hour(s))  SARS CORONAVIRUS 2 (TAT 6-24 HRS) Nasopharyngeal Nasopharyngeal Swab     Status: None   Collection Time: 01/03/20  2:25 PM   Specimen: Nasopharyngeal Swab  Result Value Ref Range Status   SARS Coronavirus 2 NEGATIVE NEGATIVE Final    Comment: (NOTE) SARS-CoV-2 target nucleic acids are NOT DETECTED. The SARS-CoV-2 RNA is generally detectable in upper and lower respiratory specimens during the acute phase of infection. Negative results do not preclude SARS-CoV-2 infection, do not rule out co-infections with other pathogens, and should not be used as the sole basis for treatment or other patient management decisions. Negative results must be combined with clinical observations, patient history, and epidemiological information. The expected result is Negative. Fact Sheet for Patients: SugarRoll.be Fact Sheet for Healthcare Providers: https://www.woods-mathews.com/ This test is not yet approved or cleared by the [place] FDA and  has been authorized for detection and/or diagnosis of SARS-CoV-2 by FDA under an Emergency Use Authorization (EUA).  This EUA will remain  in effect (meaning this test can be used) for the duration of the COVID-19 declaration under Section 56 4(b)(1) of the Act, 21 U.S.C. section 360bbb-3(b)(1), unless the authorization is terminated or revoked sooner. Performed at [facility], [place] [address] [place], [place] [address]    Time coordinating discharge:  33 minutes   SIGNED:  Irwin Brakeman, MD  [facility] 01/05/2020, 11:43 AM How to contact the [facility] Attending or Consulting provider [place] or covering provider during after hours [place], for this patient?  1. Check the care team in [facility] and look for a) attending/consulting [facility] provider listed and b) the [facility] team listed 2. Log into [email or web address] and use [place]'s universal password to access. If you do not have the password, please contact the hospital operator. 3. Locate the [facility] provider you are looking for under [facility] and page to a number that you can be directly reached. 4. If you still have difficulty reaching the provider, please page the [facility] (Director on Call) for the Hospitalists listed on amion for assistance.

## 2020-01-05 NOTE — Progress Notes (Signed)
*  PRELIMINARY RESULTS* Echocardiogram 2D Echocardiogram has been performed.  [name] 01/05/2020, 1:12 PM

## 2020-01-05 NOTE — Progress Notes (Signed)
MD made aware of 5 beats of SVT with new order for basic metabolic panel.

## 2020-01-05 NOTE — Progress Notes (Signed)
Notified POA, [name], that mother is being d/cd

## 2020-01-05 NOTE — Plan of Care (Signed)
  Problem: Education: Goal: Knowledge of disease or condition will improve 01/05/2020 1241 by Santa Lighter, RN Outcome: Adequate for Discharge 01/05/2020 1241 by Santa Lighter, RN Outcome: Progressing 01/05/2020 1049 by Santa Lighter, RN Outcome: Progressing Goal: Knowledge of secondary prevention will improve 01/05/2020 1241 by Santa Lighter, RN Outcome: Adequate for Discharge 01/05/2020 1241 by Santa Lighter, RN Outcome: Progressing 01/05/2020 1049 by Santa Lighter, RN Outcome: Progressing Goal: Knowledge of patient specific risk factors addressed and post discharge goals established will improve 01/05/2020 1241 by Santa Lighter, RN Outcome: Adequate for Discharge 01/05/2020 1241 by Santa Lighter, RN Outcome: Progressing 01/05/2020 1049 by Santa Lighter, RN Outcome: Progressing Goal: Individualized Educational Video(s) Outcome: Adequate for Discharge   Problem: Health Behavior/Discharge Planning: Goal: Ability to manage health-related needs will improve 01/05/2020 1241 by Santa Lighter, RN Outcome: Adequate for Discharge 01/05/2020 1241 by Santa Lighter, RN Outcome: Progressing 01/05/2020 1049 by Santa Lighter, RN Outcome: Progressing   Problem: Self-Care: Goal: Ability to participate in self-care as condition permits will improve 01/05/2020 1241 by Santa Lighter, RN Outcome: Adequate for Discharge 01/05/2020 1241 by Santa Lighter, RN Outcome: Progressing Goal: Verbalization of feelings and concerns over difficulty with self-care will improve 01/05/2020 1241 by Santa Lighter, RN Outcome: Adequate for Discharge 01/05/2020 1241 by Santa Lighter, RN Outcome: Progressing   Problem: Self-Care: Goal: Ability to participate in self-care as condition permits will improve 01/05/2020 1241 by Santa Lighter, RN Outcome: Adequate for Discharge 01/05/2020 1241 by Santa Lighter, RN Outcome: Progressing Goal: Verbalization of feelings and concerns over difficulty with self-care  will improve 01/05/2020 1241 by Santa Lighter, RN Outcome: Adequate for Discharge 01/05/2020 1241 by Santa Lighter, RN Outcome: Progressing   Problem: Intracerebral Hemorrhage Tissue Perfusion: Goal: Complications of Intracerebral Hemorrhage will be minimized Outcome: Adequate for Discharge   Problem: Ischemic Stroke/TIA Tissue Perfusion: Goal: Complications of ischemic stroke/TIA will be minimized 01/05/2020 1241 by Santa Lighter, RN Outcome: Adequate for Discharge 01/05/2020 1241 by Santa Lighter, RN Outcome: Progressing 01/05/2020 1049 by Santa Lighter, RN Outcome: Progressing   Problem: Spontaneous Subarachnoid Hemorrhage Tissue Perfusion: Goal: Complications of Spontaneous Subarachnoid Hemorrhage will be minimized Outcome: Adequate for Discharge

## 2020-01-06 DIAGNOSIS — I10 Essential (primary) hypertension: Secondary | ICD-10-CM | POA: Diagnosis not present

## 2020-01-06 DIAGNOSIS — Z7982 Long term (current) use of aspirin: Secondary | ICD-10-CM | POA: Diagnosis not present

## 2020-01-06 DIAGNOSIS — E119 Type 2 diabetes mellitus without complications: Secondary | ICD-10-CM | POA: Diagnosis not present

## 2020-01-06 DIAGNOSIS — M1991 Primary osteoarthritis, unspecified site: Secondary | ICD-10-CM | POA: Diagnosis not present

## 2020-01-06 DIAGNOSIS — E7800 Pure hypercholesterolemia, unspecified: Secondary | ICD-10-CM | POA: Diagnosis not present

## 2020-01-06 DIAGNOSIS — E876 Hypokalemia: Secondary | ICD-10-CM | POA: Diagnosis not present

## 2020-01-06 DIAGNOSIS — Z9181 History of falling: Secondary | ICD-10-CM | POA: Diagnosis not present

## 2020-01-06 DIAGNOSIS — M5136 Other intervertebral disc degeneration, lumbar region: Secondary | ICD-10-CM | POA: Diagnosis not present

## 2020-01-06 DIAGNOSIS — G4733 Obstructive sleep apnea (adult) (pediatric): Secondary | ICD-10-CM | POA: Diagnosis not present

## 2020-01-06 DIAGNOSIS — Z87891 Personal history of nicotine dependence: Secondary | ICD-10-CM | POA: Diagnosis not present

## 2020-01-06 DIAGNOSIS — I69354 Hemiplegia and hemiparesis following cerebral infarction affecting left non-dominant side: Secondary | ICD-10-CM | POA: Diagnosis not present

## 2020-01-06 DIAGNOSIS — Z7902 Long term (current) use of antithrombotics/antiplatelets: Secondary | ICD-10-CM | POA: Diagnosis not present

## 2020-01-06 DIAGNOSIS — E7849 Other hyperlipidemia: Secondary | ICD-10-CM | POA: Diagnosis not present

## 2020-01-06 DIAGNOSIS — I2510 Atherosclerotic heart disease of native coronary artery without angina pectoris: Secondary | ICD-10-CM | POA: Diagnosis not present

## 2020-01-08 DIAGNOSIS — Z7902 Long term (current) use of antithrombotics/antiplatelets: Secondary | ICD-10-CM | POA: Diagnosis not present

## 2020-01-08 DIAGNOSIS — E876 Hypokalemia: Secondary | ICD-10-CM | POA: Diagnosis not present

## 2020-01-08 DIAGNOSIS — I10 Essential (primary) hypertension: Secondary | ICD-10-CM | POA: Diagnosis not present

## 2020-01-08 DIAGNOSIS — Z87891 Personal history of nicotine dependence: Secondary | ICD-10-CM | POA: Diagnosis not present

## 2020-01-08 DIAGNOSIS — I69354 Hemiplegia and hemiparesis following cerebral infarction affecting left non-dominant side: Secondary | ICD-10-CM | POA: Diagnosis not present

## 2020-01-08 DIAGNOSIS — E7800 Pure hypercholesterolemia, unspecified: Secondary | ICD-10-CM | POA: Diagnosis not present

## 2020-01-08 DIAGNOSIS — Z7982 Long term (current) use of aspirin: Secondary | ICD-10-CM | POA: Diagnosis not present

## 2020-01-08 DIAGNOSIS — Z9181 History of falling: Secondary | ICD-10-CM | POA: Diagnosis not present

## 2020-01-08 DIAGNOSIS — G4733 Obstructive sleep apnea (adult) (pediatric): Secondary | ICD-10-CM | POA: Diagnosis not present

## 2020-01-08 DIAGNOSIS — E119 Type 2 diabetes mellitus without complications: Secondary | ICD-10-CM | POA: Diagnosis not present

## 2020-01-08 DIAGNOSIS — I2510 Atherosclerotic heart disease of native coronary artery without angina pectoris: Secondary | ICD-10-CM | POA: Diagnosis not present

## 2020-01-09 ENCOUNTER — Ambulatory Visit (HOSPITAL_COMMUNITY)
Admission: RE | Admit: 2020-01-09 | Discharge: 2020-01-09 | Disposition: A | Discharge status: Home / Self Care | Payer: Medicare Other | Source: Ambulatory Visit | Attending: Internal Medicine | Admitting: Internal Medicine

## 2020-01-09 ENCOUNTER — Other Ambulatory Visit: Payer: Self-pay

## 2020-01-09 ENCOUNTER — Other Ambulatory Visit (HOSPITAL_COMMUNITY): Payer: Self-pay | Admitting: Internal Medicine

## 2020-01-09 DIAGNOSIS — R002 Palpitations: Secondary | ICD-10-CM | POA: Diagnosis not present

## 2020-01-09 DIAGNOSIS — I2510 Atherosclerotic heart disease of native coronary artery without angina pectoris: Secondary | ICD-10-CM | POA: Diagnosis not present

## 2020-01-09 DIAGNOSIS — E7849 Other hyperlipidemia: Secondary | ICD-10-CM | POA: Diagnosis not present

## 2020-01-09 DIAGNOSIS — Z87891 Personal history of nicotine dependence: Secondary | ICD-10-CM | POA: Diagnosis not present

## 2020-01-09 DIAGNOSIS — I10 Essential (primary) hypertension: Secondary | ICD-10-CM | POA: Diagnosis not present

## 2020-01-09 DIAGNOSIS — H7292 Unspecified perforation of tympanic membrane, left ear: Secondary | ICD-10-CM | POA: Diagnosis not present

## 2020-01-09 DIAGNOSIS — E559 Vitamin D deficiency, unspecified: Secondary | ICD-10-CM | POA: Diagnosis not present

## 2020-01-09 DIAGNOSIS — S0993XD Unspecified injury of face, subsequent encounter: Secondary | ICD-10-CM | POA: Diagnosis not present

## 2020-01-09 DIAGNOSIS — Z7902 Long term (current) use of antithrombotics/antiplatelets: Secondary | ICD-10-CM | POA: Diagnosis not present

## 2020-01-09 DIAGNOSIS — Z9181 History of falling: Secondary | ICD-10-CM | POA: Diagnosis not present

## 2020-01-09 DIAGNOSIS — K7469 Other cirrhosis of liver: Secondary | ICD-10-CM | POA: Diagnosis not present

## 2020-01-09 DIAGNOSIS — E039 Hypothyroidism, unspecified: Secondary | ICD-10-CM | POA: Diagnosis not present

## 2020-01-09 DIAGNOSIS — E538 Deficiency of other specified B group vitamins: Secondary | ICD-10-CM | POA: Diagnosis not present

## 2020-01-09 DIAGNOSIS — E7800 Pure hypercholesterolemia, unspecified: Secondary | ICD-10-CM | POA: Diagnosis not present

## 2020-01-09 DIAGNOSIS — Z6822 Body mass index (BMI) 22.0-22.9, adult: Secondary | ICD-10-CM | POA: Diagnosis not present

## 2020-01-09 DIAGNOSIS — E1140 Type 2 diabetes mellitus with diabetic neuropathy, unspecified: Secondary | ICD-10-CM | POA: Diagnosis not present

## 2020-01-09 DIAGNOSIS — E876 Hypokalemia: Secondary | ICD-10-CM | POA: Diagnosis not present

## 2020-01-09 DIAGNOSIS — I639 Cerebral infarction, unspecified: Secondary | ICD-10-CM | POA: Diagnosis not present

## 2020-01-09 DIAGNOSIS — E782 Mixed hyperlipidemia: Secondary | ICD-10-CM | POA: Diagnosis not present

## 2020-01-09 DIAGNOSIS — S0993XA Unspecified injury of face, initial encounter: Secondary | ICD-10-CM | POA: Diagnosis not present

## 2020-01-09 DIAGNOSIS — Z1389 Encounter for screening for other disorder: Secondary | ICD-10-CM | POA: Diagnosis not present

## 2020-01-09 DIAGNOSIS — Z0000 Encounter for general adult medical examination without abnormal findings: Secondary | ICD-10-CM | POA: Diagnosis not present

## 2020-01-09 DIAGNOSIS — E119 Type 2 diabetes mellitus without complications: Secondary | ICD-10-CM | POA: Diagnosis not present

## 2020-01-09 DIAGNOSIS — G4733 Obstructive sleep apnea (adult) (pediatric): Secondary | ICD-10-CM | POA: Diagnosis not present

## 2020-01-09 DIAGNOSIS — I69354 Hemiplegia and hemiparesis following cerebral infarction affecting left non-dominant side: Secondary | ICD-10-CM | POA: Diagnosis not present

## 2020-01-09 DIAGNOSIS — Z7982 Long term (current) use of aspirin: Secondary | ICD-10-CM | POA: Diagnosis not present

## 2020-01-11 DIAGNOSIS — G4733 Obstructive sleep apnea (adult) (pediatric): Secondary | ICD-10-CM | POA: Diagnosis not present

## 2020-01-11 DIAGNOSIS — E876 Hypokalemia: Secondary | ICD-10-CM | POA: Diagnosis not present

## 2020-01-11 DIAGNOSIS — Z9181 History of falling: Secondary | ICD-10-CM | POA: Diagnosis not present

## 2020-01-11 DIAGNOSIS — E7800 Pure hypercholesterolemia, unspecified: Secondary | ICD-10-CM | POA: Diagnosis not present

## 2020-01-11 DIAGNOSIS — I10 Essential (primary) hypertension: Secondary | ICD-10-CM | POA: Diagnosis not present

## 2020-01-11 DIAGNOSIS — E119 Type 2 diabetes mellitus without complications: Secondary | ICD-10-CM | POA: Diagnosis not present

## 2020-01-11 DIAGNOSIS — Z7982 Long term (current) use of aspirin: Secondary | ICD-10-CM | POA: Diagnosis not present

## 2020-01-11 DIAGNOSIS — I2510 Atherosclerotic heart disease of native coronary artery without angina pectoris: Secondary | ICD-10-CM | POA: Diagnosis not present

## 2020-01-11 DIAGNOSIS — Z7902 Long term (current) use of antithrombotics/antiplatelets: Secondary | ICD-10-CM | POA: Diagnosis not present

## 2020-01-11 DIAGNOSIS — I69354 Hemiplegia and hemiparesis following cerebral infarction affecting left non-dominant side: Secondary | ICD-10-CM | POA: Diagnosis not present

## 2020-01-11 DIAGNOSIS — Z87891 Personal history of nicotine dependence: Secondary | ICD-10-CM | POA: Diagnosis not present

## 2020-01-12 DIAGNOSIS — I69354 Hemiplegia and hemiparesis following cerebral infarction affecting left non-dominant side: Secondary | ICD-10-CM | POA: Diagnosis not present

## 2020-01-12 DIAGNOSIS — I2510 Atherosclerotic heart disease of native coronary artery without angina pectoris: Secondary | ICD-10-CM | POA: Diagnosis not present

## 2020-01-12 DIAGNOSIS — Z7902 Long term (current) use of antithrombotics/antiplatelets: Secondary | ICD-10-CM | POA: Diagnosis not present

## 2020-01-12 DIAGNOSIS — E876 Hypokalemia: Secondary | ICD-10-CM | POA: Diagnosis not present

## 2020-01-12 DIAGNOSIS — E7800 Pure hypercholesterolemia, unspecified: Secondary | ICD-10-CM | POA: Diagnosis not present

## 2020-01-12 DIAGNOSIS — Z7982 Long term (current) use of aspirin: Secondary | ICD-10-CM | POA: Diagnosis not present

## 2020-01-12 DIAGNOSIS — G4733 Obstructive sleep apnea (adult) (pediatric): Secondary | ICD-10-CM | POA: Diagnosis not present

## 2020-01-12 DIAGNOSIS — E119 Type 2 diabetes mellitus without complications: Secondary | ICD-10-CM | POA: Diagnosis not present

## 2020-01-12 DIAGNOSIS — Z9181 History of falling: Secondary | ICD-10-CM | POA: Diagnosis not present

## 2020-01-12 DIAGNOSIS — I10 Essential (primary) hypertension: Secondary | ICD-10-CM | POA: Diagnosis not present

## 2020-01-12 DIAGNOSIS — Z87891 Personal history of nicotine dependence: Secondary | ICD-10-CM | POA: Diagnosis not present

## 2020-01-16 DIAGNOSIS — E876 Hypokalemia: Secondary | ICD-10-CM | POA: Diagnosis not present

## 2020-01-16 DIAGNOSIS — Z7982 Long term (current) use of aspirin: Secondary | ICD-10-CM | POA: Diagnosis not present

## 2020-01-16 DIAGNOSIS — I10 Essential (primary) hypertension: Secondary | ICD-10-CM | POA: Diagnosis not present

## 2020-01-16 DIAGNOSIS — I69354 Hemiplegia and hemiparesis following cerebral infarction affecting left non-dominant side: Secondary | ICD-10-CM | POA: Diagnosis not present

## 2020-01-16 DIAGNOSIS — E119 Type 2 diabetes mellitus without complications: Secondary | ICD-10-CM | POA: Diagnosis not present

## 2020-01-16 DIAGNOSIS — Z87891 Personal history of nicotine dependence: Secondary | ICD-10-CM | POA: Diagnosis not present

## 2020-01-16 DIAGNOSIS — Z9181 History of falling: Secondary | ICD-10-CM | POA: Diagnosis not present

## 2020-01-16 DIAGNOSIS — Z7902 Long term (current) use of antithrombotics/antiplatelets: Secondary | ICD-10-CM | POA: Diagnosis not present

## 2020-01-16 DIAGNOSIS — G4733 Obstructive sleep apnea (adult) (pediatric): Secondary | ICD-10-CM | POA: Diagnosis not present

## 2020-01-16 DIAGNOSIS — I2510 Atherosclerotic heart disease of native coronary artery without angina pectoris: Secondary | ICD-10-CM | POA: Diagnosis not present

## 2020-01-16 DIAGNOSIS — E7800 Pure hypercholesterolemia, unspecified: Secondary | ICD-10-CM | POA: Diagnosis not present

## 2020-01-18 ENCOUNTER — Other Ambulatory Visit: Payer: Self-pay

## 2020-01-18 ENCOUNTER — Emergency Department (HOSPITAL_COMMUNITY)
Admission: EM | Admit: 2020-01-18 | Discharge: 2020-01-18 | Disposition: A | Discharge status: Home / Self Care | Payer: Medicare Other | Attending: Emergency Medicine | Admitting: Emergency Medicine

## 2020-01-18 ENCOUNTER — Encounter (HOSPITAL_COMMUNITY): Payer: Self-pay | Admitting: Emergency Medicine

## 2020-01-18 ENCOUNTER — Emergency Department (HOSPITAL_COMMUNITY): Payer: Medicare Other

## 2020-01-18 DIAGNOSIS — Z7982 Long term (current) use of aspirin: Secondary | ICD-10-CM | POA: Diagnosis not present

## 2020-01-18 DIAGNOSIS — Z87891 Personal history of nicotine dependence: Secondary | ICD-10-CM | POA: Diagnosis not present

## 2020-01-18 DIAGNOSIS — R519 Headache, unspecified: Secondary | ICD-10-CM | POA: Diagnosis not present

## 2020-01-18 DIAGNOSIS — E119 Type 2 diabetes mellitus without complications: Secondary | ICD-10-CM | POA: Insufficient documentation

## 2020-01-18 DIAGNOSIS — E7800 Pure hypercholesterolemia, unspecified: Secondary | ICD-10-CM | POA: Diagnosis not present

## 2020-01-18 DIAGNOSIS — I69354 Hemiplegia and hemiparesis following cerebral infarction affecting left non-dominant side: Secondary | ICD-10-CM | POA: Diagnosis not present

## 2020-01-18 DIAGNOSIS — Z7902 Long term (current) use of antithrombotics/antiplatelets: Secondary | ICD-10-CM | POA: Diagnosis not present

## 2020-01-18 DIAGNOSIS — I2510 Atherosclerotic heart disease of native coronary artery without angina pectoris: Secondary | ICD-10-CM | POA: Insufficient documentation

## 2020-01-18 DIAGNOSIS — H539 Unspecified visual disturbance: Secondary | ICD-10-CM | POA: Diagnosis not present

## 2020-01-18 DIAGNOSIS — Z9181 History of falling: Secondary | ICD-10-CM | POA: Diagnosis not present

## 2020-01-18 DIAGNOSIS — H5789 Other specified disorders of eye and adnexa: Secondary | ICD-10-CM | POA: Diagnosis not present

## 2020-01-18 DIAGNOSIS — I10 Essential (primary) hypertension: Secondary | ICD-10-CM | POA: Insufficient documentation

## 2020-01-18 DIAGNOSIS — Z79899 Other long term (current) drug therapy: Secondary | ICD-10-CM | POA: Diagnosis not present

## 2020-01-18 DIAGNOSIS — E876 Hypokalemia: Secondary | ICD-10-CM | POA: Diagnosis not present

## 2020-01-18 DIAGNOSIS — Z8669 Personal history of other diseases of the nervous system and sense organs: Secondary | ICD-10-CM

## 2020-01-18 DIAGNOSIS — Z7984 Long term (current) use of oral hypoglycemic drugs: Secondary | ICD-10-CM | POA: Insufficient documentation

## 2020-01-18 DIAGNOSIS — G4733 Obstructive sleep apnea (adult) (pediatric): Secondary | ICD-10-CM | POA: Diagnosis not present

## 2020-01-18 LAB — BASIC METABOLIC PANEL
Anion gap: 8 (ref 5–15)
BUN: 11 mg/dL (ref 8–23)
CO2: 30 mmol/L (ref 22–32)
Calcium: 9.1 mg/dL (ref 8.9–10.3)
Chloride: 105 mmol/L (ref 98–111)
Creatinine, Ser: 0.87 mg/dL (ref 0.44–1.00)
GFR calc Af Amer: >60 mL/min (ref >60)
GFR calc non Af Amer: >60 mL/min (ref >60)
Glucose, Bld: 97 mg/dL (ref 70–99)
Potassium: 3.5 mmol/L (ref 3.5–5.1)
Sodium: 143 mmol/L (ref 135–145)

## 2020-01-18 LAB — CBC WITH DIFFERENTIAL/PLATELET
Abs Immature Granulocytes: 0.02 10*3/uL (ref 0.00–0.07)
Basophils Absolute: 0 10*3/uL (ref 0.0–0.1)
Basophils Relative: 0 %
Eosinophils Absolute: 0.6 10*3/uL — ABNORMAL HIGH (ref 0.0–0.5)
Eosinophils Relative: 11 %
HCT: 38 % (ref 36.0–46.0)
Hemoglobin: 11.9 g/dL — ABNORMAL LOW (ref 12.0–15.0)
Immature Granulocytes: 0 %
Lymphocytes Relative: 24 %
Lymphs Abs: 1.4 10*3/uL (ref 0.7–4.0)
MCH: 30.6 pg (ref 26.0–34.0)
MCHC: 31.3 g/dL (ref 30.0–36.0)
MCV: 97.7 fL (ref 80.0–100.0)
Monocytes Absolute: 0.5 10*3/uL (ref 0.1–1.0)
Monocytes Relative: 8 %
Neutro Abs: 3.2 10*3/uL (ref 1.7–7.7)
Neutrophils Relative %: 57 %
Platelets: 133 10*3/uL — ABNORMAL LOW (ref 150–400)
RBC: 3.89 MIL/uL (ref 3.87–5.11)
RDW: 14.7 % (ref 11.5–15.5)
WBC: 5.7 10*3/uL (ref 4.0–10.5)
nRBC: 0 % (ref 0.0–0.2)

## 2020-01-18 NOTE — Discharge Instructions (Addendum)
Your lab tests and CT scan today are stable.  It is very important that you take the medicines that your doctors have prescribed you.  Make sure you are taking your aspirin and your Plavix as prescribed.

## 2020-01-18 NOTE — ED Provider Notes (Signed)
[facility] EMERGENCY DEPARTMENT Provider Note   CSN: [id number] Arrival date & time: 01/18/20  1038     History Chief Complaint  Patient presents with   Blurred Vision    [name] is a 79 y.o. female who has a significant past medical history of bilateral carotid stenosis, CAD, hypertension, HTN, DM, epilepsy, PSVT who was discharged from here on April 29 for treatment of new acute CVA per outpatient MRI completed by her neurologist Dr. Merlene Laughter.  She presents today for evaluation of a brief episode of right sided headed associated with left eye blurred vision occurring around 5 pm yesterday and lasting for less than 30 seconds.  She denies any residual symptoms at this time, however, the triaging RN noted she had blurred vision this am, but pt denies having any sx today.  She was sent here at the assistance of her home health provider.  She takes aspirin daily and was prescribed plavix at her recent DC, and has been compliant with these medicines.  At baseline, pt has left sided weakness but is improving with home pt.  Generally is in a wheelchair, but uses walker for ambulation as well.  She is currently without any new symptoms.  HPI     Past Medical History:  Diagnosis Date   Anemia    Aortic insufficiency    Moderate   Back pain    Carotid stenosis, bilateral    Cirrhosis ([place])    Closed left femoral fracture (HCC) 04/21/2017   Coronary artery disease    Stent x 2 RCA 1995, cardiac catheterization 09/2016 showing only mild atherosclerosis   DDD (degenerative disc disease), lumbar    Essential hypertension    Falls    GERD (gastroesophageal reflux disease)    Hiatal hernia    History of stroke    Hypercholesteremia    IBS (irritable bowel syndrome)    Non-alcoholic fatty liver disease    OSA (obstructive sleep apnea)    no CPAP   Pericardial effusion    a. small by echo 2018.   PSVT (paroxysmal supraventricular tachycardia) (HCC)    Recurrent  UTI    Seizures (HCC)    Stroke ([place])    Type 2 diabetes mellitus ([place])    off medication   Varices, esophageal ([place])     Patient Active Problem List   Diagnosis Date Noted   Acute CVA (cerebrovascular accident) ([place]) 01/03/2020   Tobacco use disorder 07/09/2018   Small vessel disease ([place]) 07/09/2018   Benign essential HTN    Seizures ([place])    Tachypnea    Dysphagia, post-stroke    Dysarthria 05/19/2018   Syncope and collapse    Syncope 08/24/2017   Closed left femoral fracture ([place]) 04/21/2017   Seizure disorder ([place]) 04/21/2017   Closed pertrochanteric fracture of femur, left, initial encounter ([place]) 03/02/2017   Seizure ([place]) 02/28/2017   Hip fracture ([place]) 02/28/2017   Diabetes mellitus without complication ([place]) 14/43/1540   Epiretinal membrane (ERM) of left eye 01/20/2017   Pseudophakia 01/20/2017   Chest pain 12/07/2016   Atypical chest pain 12/07/2016   Tachycardia    NSTEMI (non-ST elevated myocardial infarction) ([place]) 11/02/2016   Multifocal atrial tachycardia ([place]) 09/25/2016   Elevated troponin 09/24/2016   Hypokalemia 09/23/2016   TIA (transient ischemic attack) 05/05/2016   Intractable nausea and vomiting 10/15/2015   Acute coronary syndrome ([place]) 06/01/2015   Bronchitis 06/01/2015   Thrombocytopenia ([place]) 06/01/2015   Cerebral infarction ([place]) right occipital due to small  vessel disease s/p tPA 03/06/2014   Lower urinary tract infectious disease    PSVT (paroxysmal supraventricular tachycardia) ([place]) 12/09/2013   Esophageal varices ([place]) 03/29/2013   Hematemesis 03/29/2013   Hepatic cirrhosis ([place]) 03/29/2013   Presence of stent in right coronary artery 07/04/2011   OSA (obstructive sleep apnea) 07/04/2011   Aortic sclerosis 07/04/2011   Aortic insufficiency 07/04/2011   HTN (hypertension) 07/04/2011   Carotid stenosis, bilateral 07/04/2011   Chest pain at rest 07/03/2011   CAD (coronary artery  disease) 07/03/2011   Hypercholesteremia 07/03/2011   GERD (gastroesophageal reflux disease) 07/03/2011    Past Surgical History:  Procedure Laterality Date   [place]  [phone]   Stent to the proximal RCA after MI    CARDIAC CATHETERIZATION N/A 09/26/2016   Procedure: Left Heart Cath and Coronary Angiography;  Surgeon: Leonie Man, MD;  Location: [place] [facility];  Service: Cardiovascular;  Laterality: N/A;   CATARACT EXTRACTION W/PHACO Right 07/04/2014   Procedure: CATARACT EXTRACTION PHACO AND INTRAOCULAR LENS PLACEMENT (IOC);  Surgeon: Elta Guadeloupe T. Gershon Crane, MD;  Location: [facility];  Service: Ophthalmology;  Laterality: Right;  CDE:13.13   COLONOSCOPY     DILATION AND CURETTAGE OF UTERUS     x2   Epi Retinal Membrane Peel Left    ERCP     ESOPHAGEAL BANDING N/A 04/01/2013   Procedure: ESOPHAGEAL BANDING;  Surgeon: Rogene Houston, MD;  Location: [facility];  Service: Endoscopy;  Laterality: N/A;   ESOPHAGEAL BANDING N/A 05/24/2013   Procedure: ESOPHAGEAL BANDING;  Surgeon: Rogene Houston, MD;  [facility];  Service: Endoscopy;  Laterality: N/A;   ESOPHAGEAL BANDING N/A 06/21/2014   Procedure: ESOPHAGEAL BANDING;  Surgeon: Rogene Houston, MD;  [facility];  Service: Endoscopy;  Laterality: N/A;   ESOPHAGOGASTRODUODENOSCOPY N/A 04/01/2013   Procedure: ESOPHAGOGASTRODUODENOSCOPY (EGD);  Surgeon: Rogene Houston, MD;  [facility];  Service: Endoscopy;  Laterality: N/A;  230-rescheduled to 8:30am Ann notified pt   ESOPHAGOGASTRODUODENOSCOPY N/A 05/24/2013   Procedure: ESOPHAGOGASTRODUODENOSCOPY (EGD);  Surgeon: Rogene Houston, MD;  [facility];  Service: Endoscopy;  Laterality: N/A;  730   ESOPHAGOGASTRODUODENOSCOPY N/A 06/21/2014   Procedure: ESOPHAGOGASTRODUODENOSCOPY (EGD);  Surgeon: Rogene Houston, MD;  [facility];   Service: Endoscopy;  Laterality: N/A;  930-rescheduled 10/14 @ 1200 Ann to notify pt   EYE SURGERY  08   cataract surgery of the left eye   FEMUR IM NAIL Left 03/02/2017   Procedure: INTRAMEDULLARY (IM) NAIL FEMORAL;  Surgeon: Rod Can, MD;  Location: [place];  Service: Orthopedics;  Laterality: Left;   HARDWARE REMOVAL Right 01/17/2013   Procedure: REMOVAL OF HARDWARE AND EXCISION ULNAR STYLOID RIGHT WRIST;  Surgeon: Tennis Must, MD;  Location: [place];  Service: Orthopedics;  Laterality: Right;   MYRINGOTOMY  2012   both ears   ORIF FEMUR FRACTURE Left 04/23/2017   Procedure: OPEN REDUCTION INTERNAL FIXATION (ORIF) DISTAL FEMUR FRACTURE (FRACTURE AROUND FEMORAL NAIL);  Surgeon: Altamese Tallaboa Alta, MD;  Location: [place];  Service: Orthopedics;  Laterality: Left;   TONSILLECTOMY     VAGINAL HYSTERECTOMY  1972   WRIST SURGERY     rt wrist hardwear removal     OB History   No obstetric history on file.     Family History  Problem Relation Age of  Onset   Diabetes Mother    Heart failure Father    Heart failure Maternal Aunt     Social History   Tobacco Use   Smoking status: Former Smoker    Packs/day: 0.25    Years: 50.00    Pack years: 12.50    Types: Cigarettes    Quit date: 11/03/2017    Years since quitting: 2.2   Smokeless tobacco: Never Used   Tobacco comment: 1 pk lasts a week.   Substance Use Topics   Alcohol use: No    Alcohol/week: 0.0 standard drinks   Drug use: No    Home Medications Prior to Admission medications   Medication Sig Start Date End Date Taking? Authorizing Provider  aspirin EC 81 MG tablet Take 81 mg by mouth daily.    Jake Samples, PA-C  aspirin-acetaminophen-caffeine (EXCEDRIN MIGRAINE) [phone] MG tablet Take 1 tablet by mouth every 6 (six) hours as needed for headache.    [provider]  atorvastatin (LIPITOR) 80 MG tablet Take 1 tablet (80 mg total) by mouth daily. 07/15/18   Angiulli,  Lavon Paganini, PA-C  calcium citrate (CALCITRATE - DOSED IN MG ELEMENTAL CALCIUM) 950 MG tablet Take 1 tablet (200 mg of elemental calcium total) by mouth daily. 07/15/18   Angiulli, Lavon Paganini, PA-C  clopidogrel (PLAVIX) 75 MG tablet Take 1 tablet (75 mg total) by mouth daily with breakfast. 07/15/18   Angiulli, Lavon Paganini, PA-C  diltiazem (TIAZAC) 120 MG 24 hr capsule Take 1 capsule by mouth daily.    [provider]  docusate sodium (COLACE) 100 MG capsule Take 100 mg by mouth daily.     [provider]  furosemide (LASIX) 20 MG tablet Take 20 mg by mouth daily.  12/26/19   [provider]  metoprolol succinate (TOPROL-XL) 25 MG 24 hr tablet Take 25 mg by mouth daily. 12/26/19   [provider]  Multiple Vitamins-Minerals (CENTRUM ADULTS) TABS Take 1 tablet by mouth daily.    [provider]  niacin (NIASPAN) 500 MG CR tablet Take 1 tablet by mouth daily. 06/14/18   [provider]  omeprazole (PRILOSEC) 20 MG capsule Take 1 capsule (20 mg total) by mouth daily. 03/05/18   Setzer, Rona Ravens, NP  zolpidem (AMBIEN) 5 MG tablet Take 1 tablet (5 mg total) by mouth at bedtime as needed for sleep. 01/05/20   Murlean Iba, MD    Allergies    Tape  Review of Systems   Review of Systems  Constitutional: Negative for chills and fever.  HENT: Negative for congestion and sore throat.   Eyes: Positive for visual disturbance.  Respiratory: Negative for chest tightness and shortness of breath.   Cardiovascular: Negative for chest pain.  Gastrointestinal: Negative for abdominal pain, nausea and vomiting.  Genitourinary: Negative.   Musculoskeletal: Negative for arthralgias, joint swelling and neck pain.  Skin: Negative.  Negative for rash and wound.  Neurological: Positive for headaches. Negative for dizziness, weakness, light-headedness and numbness.  Hematological: Negative.   Psychiatric/Behavioral: Negative.     Physical Exam Updated Vital Signs BP  (!) 161/46 (BP Location: Right Arm)    Pulse 65    Temp 98 F (36.7 C) (Oral)    Ht 5' 3"  (1.6 m)    Wt 56.7 kg    SpO2 100%    BMI 22.14 kg/m   Physical Exam Vitals and nursing note reviewed.  Constitutional:      Appearance: She is well-developed.  HENT:  Head: Normocephalic and atraumatic.  Eyes:     Extraocular Movements: Extraocular movements intact.     Conjunctiva/sclera: Conjunctivae normal.     Pupils: Pupils are equal, round, and reactive to light.  Cardiovascular:     Rate and Rhythm: Normal rate and regular rhythm.     Heart sounds: Normal heart sounds.  Pulmonary:     Effort: Pulmonary effort is normal.     Breath sounds: Normal breath sounds.  Musculoskeletal:        General: Normal range of motion.     Cervical back: Normal range of motion and neck supple.  Lymphadenopathy:     Cervical: No cervical adenopathy.  Skin:    General: Skin is warm and dry.  Neurological:     Mental Status: She is alert and oriented to person, place, and time.     GCS: GCS eye subscore is 4. GCS verbal subscore is 5. GCS motor subscore is 6.     Sensory: No sensory deficit.     Motor: Motor function is intact.     Comments: Cranial nerves III-XII intact.  No pronator drift. Slight weakness left upper and lower extremities, no foot drop.  Stable per patient and per review of prior chart.   Psychiatric:        Attention and Perception: Attention normal.        Mood and Affect: Mood normal.        Speech: Speech normal.        Behavior: Behavior normal.        Thought Content: Thought content normal.        Cognition and Memory: Cognition normal.        Judgment: Judgment normal.     ED Results / Procedures / Treatments   Labs (all labs ordered are listed, but only abnormal results are displayed) Labs Reviewed  CBC WITH DIFFERENTIAL/PLATELET - Abnormal; Notable for the following components:      Result Value   Hemoglobin 11.9 (*)    Platelets 133 (*)    Eosinophils  Absolute 0.6 (*)    All other components within normal limits  BASIC METABOLIC PANEL    EKG EKG Interpretation  Date/Time:  Wednesday Jan 18 2020 10:50:09 EDT Ventricular Rate:  58 PR Interval:    QRS Duration: 110 QT Interval:  463 QTC Calculation: 455 R Axis:   -9 Text Interpretation: Sinus rhythm Prolonged PR interval Anteroseptal infarct, age indeterminate No STEMI Confirmed by Nanda Quinton [phone]) on 01/18/2020 11:03:42 AM   Radiology CT Head Wo Contrast  Result Date: 01/18/2020 CLINICAL DATA:  Headache, acute, normal neuro exam. Additional provided: Patient reports blurred vision, hand tingling, "floaters" in left eye intermittent since yesterday. EXAM: CT HEAD WITHOUT CONTRAST TECHNIQUE: Contiguous axial images were obtained from the base of the skull through the vertex without intravenous contrast. COMPARISON:  CT angiogram head/neck 01/03/2020, brain MRI 01/03/2020. FINDINGS: Brain: A few tiny supratentorial and infratentorial infarcts which were acute at time of prior MRI 01/03/2020 are not appreciable by CT. Redemonstrated chronic cortically based infarct within the right MCA vascular territory, affecting the right frontoparietal lobes and posterior right insula. Redemonstrated chronic lacunar infarcts within deep gray nuclei and right cerebellum. Background mild ill-defined hypoattenuation within cerebral white matter is nonspecific, but consistent with chronic small vessel ischemic disease. Stable, mild generalized parenchymal atrophy. There is no acute intracranial hemorrhage. No acute demarcated cortical infarct is identified. No extra-axial fluid collection. No evidence of intracranial mass. No midline shift.  Vascular: No hyperdense vessel.  Atherosclerotic calcifications. Skull: Normal. Negative for fracture or focal lesion. Sinuses/Orbits: Visualized orbits show no acute finding. No significant paranasal sinus disease or mastoid effusion at the imaged levels. IMPRESSION: 1. No  CT evidence of acute intracranial abnormality. 2. A few tiny supratentorial and infratentorial infarcts which were acute at time of prior MRI 01/03/2020 are not appreciable by CT. 3. Redemonstrated chronic right frontoparietal cortically based infarct. 4. Unchanged chronic lacunar infarcts within the deep gray nuclei and right cerebellum. Stable background generalized parenchymal atrophy and chronic small vessel ischemic disease. Electronically Signed   By: Kellie Simmering DO   On: 01/18/2020 12:26    Procedures Procedures (including critical care time)  Medications Ordered in ED Medications - No data to display  ED Course  I have reviewed the triage vital signs and the nursing notes.  Pertinent labs & imaging results that were available during my care of the patient were reviewed by me and considered in my medical decision making (see chart for details).    MDM Rules/Calculators/A&P                      9:14 AM Spoke with Dr. Merlene Laughter regarding patient and her transient now resolved symptoms.  He did not recommend MRI imaging at this time.  He did want to confirm that she is taking the Plavix and the aspirin which she was prescribed when she was discharged from her admission which pt concurs.  He suggested  CT head for imaging, no further changes if stable.  CT imaging reviewed and stable.  Discussed findings with pt and with son (legal guardian) per phone.  No changes to current regimen, plan f/u with pcp and with Dr Merlene Laughter as originally scheduled at time of hospital dc.  Strict return precautions were also discussed for any new or persistent sx.  Final Clinical Impression(s) / ED Diagnoses Final diagnoses:  History of blurred vision    Rx / DC Orders ED Discharge Orders    None       Landis Martins 01/19/20 0915    Long, Wonda Olds, MD 01/20/20 [id number]

## 2020-01-18 NOTE — ED Triage Notes (Signed)
Pt reports blurred vision, hand tingling, "floaters" in left eye intermittent since 1700 yesterday. Pt reports last episode started at 1010 resolved 1030. Pt denies any neuro symptoms at this time. NIHSS 0 at this time.

## 2020-01-19 DIAGNOSIS — E876 Hypokalemia: Secondary | ICD-10-CM | POA: Diagnosis not present

## 2020-01-19 DIAGNOSIS — Z9181 History of falling: Secondary | ICD-10-CM | POA: Diagnosis not present

## 2020-01-19 DIAGNOSIS — I69354 Hemiplegia and hemiparesis following cerebral infarction affecting left non-dominant side: Secondary | ICD-10-CM | POA: Diagnosis not present

## 2020-01-19 DIAGNOSIS — E7800 Pure hypercholesterolemia, unspecified: Secondary | ICD-10-CM | POA: Diagnosis not present

## 2020-01-19 DIAGNOSIS — Z7902 Long term (current) use of antithrombotics/antiplatelets: Secondary | ICD-10-CM | POA: Diagnosis not present

## 2020-01-19 DIAGNOSIS — I2510 Atherosclerotic heart disease of native coronary artery without angina pectoris: Secondary | ICD-10-CM | POA: Diagnosis not present

## 2020-01-19 DIAGNOSIS — Z7982 Long term (current) use of aspirin: Secondary | ICD-10-CM | POA: Diagnosis not present

## 2020-01-19 DIAGNOSIS — Z87891 Personal history of nicotine dependence: Secondary | ICD-10-CM | POA: Diagnosis not present

## 2020-01-19 DIAGNOSIS — G4733 Obstructive sleep apnea (adult) (pediatric): Secondary | ICD-10-CM | POA: Diagnosis not present

## 2020-01-19 DIAGNOSIS — E119 Type 2 diabetes mellitus without complications: Secondary | ICD-10-CM | POA: Diagnosis not present

## 2020-01-19 DIAGNOSIS — I10 Essential (primary) hypertension: Secondary | ICD-10-CM | POA: Diagnosis not present

## 2020-01-20 DIAGNOSIS — I69354 Hemiplegia and hemiparesis following cerebral infarction affecting left non-dominant side: Secondary | ICD-10-CM | POA: Diagnosis not present

## 2020-01-20 DIAGNOSIS — E876 Hypokalemia: Secondary | ICD-10-CM | POA: Diagnosis not present

## 2020-01-20 DIAGNOSIS — I10 Essential (primary) hypertension: Secondary | ICD-10-CM | POA: Diagnosis not present

## 2020-01-20 DIAGNOSIS — Z87891 Personal history of nicotine dependence: Secondary | ICD-10-CM | POA: Diagnosis not present

## 2020-01-20 DIAGNOSIS — Z7902 Long term (current) use of antithrombotics/antiplatelets: Secondary | ICD-10-CM | POA: Diagnosis not present

## 2020-01-20 DIAGNOSIS — E7800 Pure hypercholesterolemia, unspecified: Secondary | ICD-10-CM | POA: Diagnosis not present

## 2020-01-20 DIAGNOSIS — Z9181 History of falling: Secondary | ICD-10-CM | POA: Diagnosis not present

## 2020-01-20 DIAGNOSIS — I2510 Atherosclerotic heart disease of native coronary artery without angina pectoris: Secondary | ICD-10-CM | POA: Diagnosis not present

## 2020-01-20 DIAGNOSIS — Z7982 Long term (current) use of aspirin: Secondary | ICD-10-CM | POA: Diagnosis not present

## 2020-01-20 DIAGNOSIS — E119 Type 2 diabetes mellitus without complications: Secondary | ICD-10-CM | POA: Diagnosis not present

## 2020-01-20 DIAGNOSIS — G4733 Obstructive sleep apnea (adult) (pediatric): Secondary | ICD-10-CM | POA: Diagnosis not present

## 2020-01-24 DIAGNOSIS — Z87891 Personal history of nicotine dependence: Secondary | ICD-10-CM | POA: Diagnosis not present

## 2020-01-24 DIAGNOSIS — E7800 Pure hypercholesterolemia, unspecified: Secondary | ICD-10-CM | POA: Diagnosis not present

## 2020-01-24 DIAGNOSIS — Z7982 Long term (current) use of aspirin: Secondary | ICD-10-CM | POA: Diagnosis not present

## 2020-01-24 DIAGNOSIS — E876 Hypokalemia: Secondary | ICD-10-CM | POA: Diagnosis not present

## 2020-01-24 DIAGNOSIS — Z7902 Long term (current) use of antithrombotics/antiplatelets: Secondary | ICD-10-CM | POA: Diagnosis not present

## 2020-01-24 DIAGNOSIS — I2510 Atherosclerotic heart disease of native coronary artery without angina pectoris: Secondary | ICD-10-CM | POA: Diagnosis not present

## 2020-01-24 DIAGNOSIS — I69354 Hemiplegia and hemiparesis following cerebral infarction affecting left non-dominant side: Secondary | ICD-10-CM | POA: Diagnosis not present

## 2020-01-24 DIAGNOSIS — Z9181 History of falling: Secondary | ICD-10-CM | POA: Diagnosis not present

## 2020-01-24 DIAGNOSIS — E119 Type 2 diabetes mellitus without complications: Secondary | ICD-10-CM | POA: Diagnosis not present

## 2020-01-24 DIAGNOSIS — G4733 Obstructive sleep apnea (adult) (pediatric): Secondary | ICD-10-CM | POA: Diagnosis not present

## 2020-01-24 DIAGNOSIS — I10 Essential (primary) hypertension: Secondary | ICD-10-CM | POA: Diagnosis not present

## 2020-01-26 DIAGNOSIS — Z7902 Long term (current) use of antithrombotics/antiplatelets: Secondary | ICD-10-CM | POA: Diagnosis not present

## 2020-01-26 DIAGNOSIS — I2510 Atherosclerotic heart disease of native coronary artery without angina pectoris: Secondary | ICD-10-CM | POA: Diagnosis not present

## 2020-01-26 DIAGNOSIS — Z9181 History of falling: Secondary | ICD-10-CM | POA: Diagnosis not present

## 2020-01-26 DIAGNOSIS — E7800 Pure hypercholesterolemia, unspecified: Secondary | ICD-10-CM | POA: Diagnosis not present

## 2020-01-26 DIAGNOSIS — Z87891 Personal history of nicotine dependence: Secondary | ICD-10-CM | POA: Diagnosis not present

## 2020-01-26 DIAGNOSIS — G4733 Obstructive sleep apnea (adult) (pediatric): Secondary | ICD-10-CM | POA: Diagnosis not present

## 2020-01-26 DIAGNOSIS — I69354 Hemiplegia and hemiparesis following cerebral infarction affecting left non-dominant side: Secondary | ICD-10-CM | POA: Diagnosis not present

## 2020-01-26 DIAGNOSIS — Z7982 Long term (current) use of aspirin: Secondary | ICD-10-CM | POA: Diagnosis not present

## 2020-01-26 DIAGNOSIS — I10 Essential (primary) hypertension: Secondary | ICD-10-CM | POA: Diagnosis not present

## 2020-01-26 DIAGNOSIS — E876 Hypokalemia: Secondary | ICD-10-CM | POA: Diagnosis not present

## 2020-01-26 DIAGNOSIS — E119 Type 2 diabetes mellitus without complications: Secondary | ICD-10-CM | POA: Diagnosis not present

## 2020-01-30 DIAGNOSIS — E119 Type 2 diabetes mellitus without complications: Secondary | ICD-10-CM | POA: Diagnosis not present

## 2020-01-30 DIAGNOSIS — I10 Essential (primary) hypertension: Secondary | ICD-10-CM | POA: Diagnosis not present

## 2020-01-30 DIAGNOSIS — E876 Hypokalemia: Secondary | ICD-10-CM | POA: Diagnosis not present

## 2020-01-30 DIAGNOSIS — I69354 Hemiplegia and hemiparesis following cerebral infarction affecting left non-dominant side: Secondary | ICD-10-CM | POA: Diagnosis not present

## 2020-01-30 DIAGNOSIS — G4733 Obstructive sleep apnea (adult) (pediatric): Secondary | ICD-10-CM | POA: Diagnosis not present

## 2020-01-30 DIAGNOSIS — Z7982 Long term (current) use of aspirin: Secondary | ICD-10-CM | POA: Diagnosis not present

## 2020-01-30 DIAGNOSIS — Z87891 Personal history of nicotine dependence: Secondary | ICD-10-CM | POA: Diagnosis not present

## 2020-01-30 DIAGNOSIS — Z7902 Long term (current) use of antithrombotics/antiplatelets: Secondary | ICD-10-CM | POA: Diagnosis not present

## 2020-01-30 DIAGNOSIS — I2510 Atherosclerotic heart disease of native coronary artery without angina pectoris: Secondary | ICD-10-CM | POA: Diagnosis not present

## 2020-01-30 DIAGNOSIS — E7800 Pure hypercholesterolemia, unspecified: Secondary | ICD-10-CM | POA: Diagnosis not present

## 2020-01-30 DIAGNOSIS — Z9181 History of falling: Secondary | ICD-10-CM | POA: Diagnosis not present

## 2020-02-01 DIAGNOSIS — Z87891 Personal history of nicotine dependence: Secondary | ICD-10-CM | POA: Diagnosis not present

## 2020-02-01 DIAGNOSIS — Z7982 Long term (current) use of aspirin: Secondary | ICD-10-CM | POA: Diagnosis not present

## 2020-02-01 DIAGNOSIS — E7800 Pure hypercholesterolemia, unspecified: Secondary | ICD-10-CM | POA: Diagnosis not present

## 2020-02-01 DIAGNOSIS — G4733 Obstructive sleep apnea (adult) (pediatric): Secondary | ICD-10-CM | POA: Diagnosis not present

## 2020-02-01 DIAGNOSIS — Z7902 Long term (current) use of antithrombotics/antiplatelets: Secondary | ICD-10-CM | POA: Diagnosis not present

## 2020-02-01 DIAGNOSIS — E119 Type 2 diabetes mellitus without complications: Secondary | ICD-10-CM | POA: Diagnosis not present

## 2020-02-01 DIAGNOSIS — I2510 Atherosclerotic heart disease of native coronary artery without angina pectoris: Secondary | ICD-10-CM | POA: Diagnosis not present

## 2020-02-01 DIAGNOSIS — Z9181 History of falling: Secondary | ICD-10-CM | POA: Diagnosis not present

## 2020-02-01 DIAGNOSIS — E876 Hypokalemia: Secondary | ICD-10-CM | POA: Diagnosis not present

## 2020-02-01 DIAGNOSIS — I10 Essential (primary) hypertension: Secondary | ICD-10-CM | POA: Diagnosis not present

## 2020-02-01 DIAGNOSIS — I69354 Hemiplegia and hemiparesis following cerebral infarction affecting left non-dominant side: Secondary | ICD-10-CM | POA: Diagnosis not present

## 2020-02-06 DIAGNOSIS — M1991 Primary osteoarthritis, unspecified site: Secondary | ICD-10-CM | POA: Diagnosis not present

## 2020-02-06 DIAGNOSIS — E7849 Other hyperlipidemia: Secondary | ICD-10-CM | POA: Diagnosis not present

## 2020-02-06 DIAGNOSIS — M5136 Other intervertebral disc degeneration, lumbar region: Secondary | ICD-10-CM | POA: Diagnosis not present

## 2020-02-06 DIAGNOSIS — I10 Essential (primary) hypertension: Secondary | ICD-10-CM | POA: Diagnosis not present

## 2020-02-07 HISTORY — PX: OTHER SURGICAL HISTORY: SHX169

## 2020-02-08 ENCOUNTER — Other Ambulatory Visit: Payer: Self-pay

## 2020-02-08 ENCOUNTER — Ambulatory Visit: Payer: Medicare Other | Admitting: Internal Medicine

## 2020-02-08 ENCOUNTER — Encounter: Payer: Self-pay | Admitting: Internal Medicine

## 2020-02-08 VITALS — BP 128/44 | HR 70 | Ht 63.0 in | Wt 134.0 lb

## 2020-02-08 DIAGNOSIS — I639 Cerebral infarction, unspecified: Secondary | ICD-10-CM

## 2020-02-08 NOTE — Patient Instructions (Addendum)
Medication Instructions:  Your physician recommends that you continue on your current medications as directed. Please refer to the Current Medication list given to you today.  Labwork: None ordered.  Testing/Procedures: None ordered.  Follow-Up:  Your physician wants you to follow-up in: one year with Dr. Lovena Le.  You will receive a reminder letter in the mail two months in advance. If you don't receive a letter, please call our office to schedule the follow-up appointment.   Any Other Special Instructions Will Be Listed Below (If Applicable).  If you need a refill on your cardiac medications before your next appointment, please call your pharmacy.    Implantable Loop Recorder Placement, Care After This sheet gives you information about how to care for yourself after your procedure. Your health care provider may also give you more specific instructions. If you have problems or questions, contact your health care provider. What can I expect after the procedure? After the procedure, it is common to have:  Soreness or discomfort near the incision.  Some swelling or bruising near the incision. Follow these instructions at home: Incision care   Follow instructions from your health care provider about how to take care of your incision. Make sure you: ? Leave your outer dressing on for 72 hours.  After 72 hours you can remove that and shower. ? Leave adhesive strips in place. These skin closures may need to stay in place for 2 weeks or longer. If adhesive strip edges start to loosen and curl up, you may trim the loose edges. Do not remove adhesive strips completely unless your health care provider tells you to do that.  Check your incision area every day for signs of infection. Check for: ? Redness, swelling, or pain. ? Fluid or blood. ? Warmth. ? Pus or a bad smell. Do not take baths, swim, or use a hot tub until your incision is completely healed.  Activity  Return to your  normal activities.  General instructions  Follow instructions from your health care provider about how to manage your implantable loop recorder and transmit the information. Learn how to activate a recording if this is necessary for your type of device.  Do not go through a metal detection gate, and do not let someone hold a metal detector over your chest. Show your ID card.  Do not have an MRI unless you check with your health care provider first.  Take over-the-counter and prescription medicines only as told by your health care provider.  Keep all follow-up visits as told by your health care provider. This is important. Contact a health care provider if:  You have redness, swelling, or pain around your incision.  You have a fever.  You have pain that is not relieved by your pain medicine.  You have triggered your device because of fainting (syncope) or because of a heartbeat that feels like it is racing, slow, fluttering, or skipping (palpitations). Get help right away if you have:  Chest pain.  Difficulty breathing. Summary  After the procedure, it is common to have soreness or discomfort near the incision.  Change your dressing as told by your health care provider.  Follow instructions from your health care provider about how to manage your implantable loop recorder and transmit the information.  Keep all follow-up visits as told by your health care provider. This is important. This information is not intended to replace advice given to you by your health care provider. Make sure you discuss any questions you  have with your health care provider. Document Released: 08/06/2015 Document Revised: 10/10/2017 Document Reviewed: 10/10/2017 Elsevier Patient Education  2020 [employer].

## 2020-02-08 NOTE — Progress Notes (Signed)
HPI [name] is referred today to consider ILR insertion. She is a pleasant 79 yo woman with a cryptogenic stroke, but did not initially get an ILR inserted and presents for ILR consideration. She had a cryptogenic stroke back in April. She has had a nice recovery. She denies palpitations/chest pain or sob.  Allergies  Allergen Reactions  . Tape Rash and Other (See Comments)    Please use paper tape     Current Outpatient Medications  Medication Sig Dispense Refill  . aspirin EC 81 MG tablet Take 81 mg by mouth daily.    Marland Kitchen aspirin-acetaminophen-caffeine (EXCEDRIN MIGRAINE) 250-250-65 MG tablet Take 1 tablet by mouth every 6 (six) hours as needed for headache.    Marland Kitchen atorvastatin (LIPITOR) 80 MG tablet Take 1 tablet (80 mg total) by mouth daily. 30 tablet 1  . calcium citrate (CALCITRATE - DOSED IN MG ELEMENTAL CALCIUM) 950 MG tablet Take 1 tablet (200 mg of elemental calcium total) by mouth daily. 30 tablet 1  . clopidogrel (PLAVIX) 75 MG tablet Take 1 tablet (75 mg total) by mouth daily with breakfast. 30 tablet 1  . diltiazem (TIAZAC) 120 MG 24 hr capsule Take 1 capsule by mouth daily.    Marland Kitchen docusate sodium (COLACE) 100 MG capsule Take 100 mg by mouth daily.     . furosemide (LASIX) 20 MG tablet Take 20 mg by mouth daily.     Marland Kitchen HYDROcodone-acetaminophen (NORCO) 10-325 MG tablet Take 1 tablet by mouth as needed for pain.    . metoprolol succinate (TOPROL-XL) 25 MG 24 hr tablet Take 25 mg by mouth daily.    . Multiple Vitamins-Minerals (CENTRUM ADULTS) TABS Take 1 tablet by mouth daily.    . niacin (NIASPAN) 500 MG CR tablet Take 1 tablet by mouth daily.    Marland Kitchen omeprazole (PRILOSEC) 20 MG capsule Take 1 capsule (20 mg total) by mouth daily. 90 capsule 3  . zolpidem (AMBIEN) 5 MG tablet Take 1 tablet (5 mg total) by mouth at bedtime as needed for sleep. 10 tablet 0   No current facility-administered medications for this visit.     Past Medical History:  Diagnosis Date  . Anemia     . Aortic insufficiency    Moderate  . Back pain   . Carotid stenosis, bilateral   . Cirrhosis ([place])   . Closed left femoral fracture ([place]) 04/21/2017  . Coronary artery disease    Stent x 2 RCA 1995, cardiac catheterization 09/2016 showing only mild atherosclerosis  . DDD (degenerative disc disease), lumbar   . Essential hypertension   . Falls   . GERD (gastroesophageal reflux disease)   . Hiatal hernia   . History of stroke   . Hypercholesteremia   . IBS (irritable bowel syndrome)   . Non-alcoholic fatty liver disease   . OSA (obstructive sleep apnea)    no CPAP  . Pericardial effusion    a. small by echo 2018.  Marland Kitchen PSVT (paroxysmal supraventricular tachycardia) ([place])   . Recurrent UTI   . Seizures ([place])   . Stroke ([place])   . Type 2 diabetes mellitus ([place])    off medication  . Varices, esophageal ([facility])     ROS:   All systems reviewed and negative except as noted in the HPI.   Past Surgical History:  Procedure Laterality Date  . [place]  . APPENDECTOMY    . BACK SURGERY  1985  . CARDIAC CATHETERIZATION  [phone]   Stent to the proximal RCA after MI   . CARDIAC CATHETERIZATION N/A 09/26/2016   Procedure: Left Heart Cath and Coronary Angiography;  Surgeon: Leonie Man, MD;  Location: [facility];  Service: Cardiovascular;  Laterality: N/A;  . CATARACT EXTRACTION W/PHACO Right 07/04/2014   Procedure: CATARACT EXTRACTION PHACO AND INTRAOCULAR LENS PLACEMENT (IOC);  Surgeon: Elta Guadeloupe T. Gershon Crane, MD;  [facility];  Service: Ophthalmology;  Laterality: Right;  CDE:13.13  . COLONOSCOPY    . DILATION AND CURETTAGE OF UTERUS     x2  . Epi Retinal Membrane Peel Left   . ERCP    . ESOPHAGEAL BANDING N/A 04/01/2013   Procedure: ESOPHAGEAL BANDING;  Surgeon: Rogene Houston, MD;  [facility];  Service: Endoscopy;  Laterality: N/A;  . ESOPHAGEAL BANDING N/A 05/24/2013   Procedure: ESOPHAGEAL BANDING;  Surgeon: Rogene Houston, MD;   [facility];  Service: Endoscopy;  Laterality: N/A;  . ESOPHAGEAL BANDING N/A 06/21/2014   Procedure: ESOPHAGEAL BANDING;  Surgeon: Rogene Houston, MD;  [facility];  Service: Endoscopy;  Laterality: N/A;  . ESOPHAGOGASTRODUODENOSCOPY N/A 04/01/2013   Procedure: ESOPHAGOGASTRODUODENOSCOPY (EGD);  Surgeon: Rogene Houston, MD;  [facility];  Service: Endoscopy;  Laterality: N/A;  230-rescheduled to 8:30am Ann notified pt  . ESOPHAGOGASTRODUODENOSCOPY N/A 05/24/2013   Procedure: ESOPHAGOGASTRODUODENOSCOPY (EGD);  Surgeon: Rogene Houston, MD;  [facility];  Service: Endoscopy;  Laterality: N/A;  730  . ESOPHAGOGASTRODUODENOSCOPY N/A 06/21/2014   Procedure: ESOPHAGOGASTRODUODENOSCOPY (EGD);  Surgeon: Rogene Houston, MD;  [facility];  Service: Endoscopy;  Laterality: N/A;  930-rescheduled 10/14 @ 1200 Ann to notify pt  . EYE SURGERY  08   cataract surgery of the left eye  . FEMUR IM NAIL Left 03/02/2017   Procedure: INTRAMEDULLARY (IM) NAIL FEMORAL;  Surgeon: Rod Can, MD;  Location: [facility];  Service: Orthopedics;  Laterality: Left;  . HARDWARE REMOVAL Right 01/17/2013   Procedure: REMOVAL OF HARDWARE AND EXCISION ULNAR STYLOID RIGHT WRIST;  Surgeon: Tennis Must, MD;  Location: [facility];  Service: Orthopedics;  Laterality: Right;  . MYRINGOTOMY  2012   both ears  . ORIF FEMUR FRACTURE Left 04/23/2017   Procedure: OPEN REDUCTION INTERNAL FIXATION (ORIF) DISTAL FEMUR FRACTURE (FRACTURE AROUND FEMORAL NAIL);  Surgeon: Altamese Cayey, MD;  Location: [facility];  Service: Orthopedics;  Laterality: Left;  . TONSILLECTOMY    . VAGINAL HYSTERECTOMY  1972  . WRIST SURGERY     rt wrist hardwear removal     Family History  Problem Relation Age of Onset  . Diabetes Mother   . Heart failure Father   . Heart failure Maternal Aunt      Social History   Socioeconomic History  . Marital status: Widowed    Spouse name: Not  on file  . Number of children: 2  . Years of education: 2 years college  . Highest education level: Some college, no degree  Occupational History    Employer: RETIRED  Tobacco Use  . Smoking status: Former Smoker    Packs/day: 0.25    Years: 50.00    Pack years: 12.50    Types: Cigarettes    Quit date: 11/03/2017    Years since quitting: 2.2  . Smokeless tobacco: Never Used  . Tobacco comment: 1 pk lasts a week.   Substance and Sexual Activity  . Alcohol use: No    Alcohol/week:  0.0 standard drinks  . Drug use: No  . Sexual activity: Not Currently    Birth control/protection: Surgical  Other Topics Concern  . Not on file  Social History Narrative  . Not on file   Social Determinants of Health   Financial Resource Strain: Low Risk   . Difficulty of Paying Living Expenses: Not hard at all  Food Insecurity: No Food Insecurity  . Worried About Charity fundraiser in the Last Year: Never true  . Ran Out of Food in the Last Year: Never true  Transportation Needs: Unmet Transportation Needs  . Lack of Transportation (Medical): Yes  . Lack of Transportation (Non-Medical): Yes  Physical Activity: Inactive  . Days of Exercise per Week: 0 days  . Minutes of Exercise per Session: 0 min  Stress: No Stress Concern Present  . Feeling of Stress : Not at all  Social Connections: Somewhat Isolated  . Frequency of Communication with Friends and Family: More than three times a week  . Frequency of Social Gatherings with Friends and Family: Never  . Attends Religious Services: 1 to 4 times per year  . Active Member of Clubs or Organizations: No  . Attends Archivist Meetings: Never  . Marital Status: Widowed  Intimate Partner Violence: Not At Risk  . Fear of Current or Ex-Partner: No  . Emotionally Abused: No  . Physically Abused: No  . Sexually Abused: No     BP (!) 128/44   Pulse 70   Ht 5' 3"  (1.6 m)   Wt 134 lb (60.8 kg)   SpO2 95%   BMI 23.74 kg/m   Physical  Exam:  Well appearing NAD HEENT: Unremarkable Neck:  No JVD, no thyromegally Lymphatics:  No adenopathy Back:  No CVA tenderness Lungs:  Clear with no wheezes HEART:  Regular rate rhythm, no murmurs, no rubs, no clicks Abd:  soft, positive bowel sounds, no organomegally, no rebound, no guarding Ext:  2 plus pulses, no edema, no cyanosis, no clubbing Skin:  No rashes no nodules Neuro:  CN II through XII intact, motor grossly intact  EKG - nsr  Assess/Plan: 1. Cryptogenic stroke - I have discussed the treatment options with the patient and the risks/benefits/goals/expectations of ILR insertion were discussed and she wishes to proceed. 2. HTN - her bp is well controlled. No change.   EP Procedure note  Procedure: ILR insertion  Preoperative diagnosis: cryptogenic stroke  Postoperative diagnsosis: same as preop  Description of the procedure: after informed consent was obtained, the patient was prepped and draped in a sterile fashion. 10 cc of lidocaine was infiltrated into the left pectoral region. A one cm stab incision was carried out. The medtronic ILR, [id number] was inserted. The R waves measured 1.2 mV. Benzoin and steri strips were placed over the skin. A bandage was applied. The patient recovered in the usual manner.  Mikle Bosworth.D.

## 2020-02-22 ENCOUNTER — Telehealth: Payer: Self-pay

## 2020-02-22 NOTE — Telephone Encounter (Signed)
Called and spoke with pt & son, they are aware of appt 02/24/20 @10 :30 am with Adline Peals, PA for in-person visit.

## 2020-02-22 NOTE — Telephone Encounter (Signed)
Alerts for AF and tachy, available EGM is tachy and appears AF w/ RVR 150-170's. Total 54 tachy and 13 AF. Burden 6.2%. On tiazac and toprol. No OAC noted.   Pt with history of cryptogenic stroke, no documented AF.  Episode alert shows 13 episodes, EGMs only avail for 2.  Need manual transmission as well as to discuss symptoms.  Attempted to reach pt, no answer, left message requesting she call back, device clinic # and hours provided.

## 2020-02-22 NOTE — Telephone Encounter (Signed)
Manual transmission received and reviewed by Dr. Lovena Le.  Pt confirmed to be in AF.  Current meds include ASA16m, Clopidogrel 711m Diltiazem 12052mMetoprolol 55m75mMD recommends referral to AF clinic, pt will need to be started on OAC.[facility]

## 2020-02-22 NOTE — Telephone Encounter (Signed)
I help the pt send a manual transmission. She states she did feel like she had some A-fib the night before.

## 2020-02-24 ENCOUNTER — Other Ambulatory Visit: Payer: Self-pay

## 2020-02-24 ENCOUNTER — Encounter (HOSPITAL_COMMUNITY): Payer: Self-pay | Admitting: Physician Assistant

## 2020-02-24 ENCOUNTER — Ambulatory Visit (HOSPITAL_COMMUNITY)
Admission: RE | Admit: 2020-02-24 | Discharge: 2020-02-24 | Disposition: A | Discharge status: Home / Self Care | Payer: Medicare Other | Source: Ambulatory Visit | Attending: Physician Assistant | Admitting: Physician Assistant

## 2020-02-24 VITALS — BP 124/60 | HR 112 | Ht 63.0 in | Wt 138.6 lb

## 2020-02-24 DIAGNOSIS — I10 Essential (primary) hypertension: Secondary | ICD-10-CM | POA: Insufficient documentation

## 2020-02-24 DIAGNOSIS — Z8673 Personal history of transient ischemic attack (TIA), and cerebral infarction without residual deficits: Secondary | ICD-10-CM | POA: Insufficient documentation

## 2020-02-24 DIAGNOSIS — Z833 Family history of diabetes mellitus: Secondary | ICD-10-CM | POA: Insufficient documentation

## 2020-02-24 DIAGNOSIS — I4891 Unspecified atrial fibrillation: Secondary | ICD-10-CM | POA: Diagnosis present

## 2020-02-24 DIAGNOSIS — Z87891 Personal history of nicotine dependence: Secondary | ICD-10-CM | POA: Diagnosis not present

## 2020-02-24 DIAGNOSIS — Z7902 Long term (current) use of antithrombotics/antiplatelets: Secondary | ICD-10-CM | POA: Diagnosis not present

## 2020-02-24 DIAGNOSIS — Z8249 Family history of ischemic heart disease and other diseases of the circulatory system: Secondary | ICD-10-CM | POA: Diagnosis not present

## 2020-02-24 DIAGNOSIS — I2510 Atherosclerotic heart disease of native coronary artery without angina pectoris: Secondary | ICD-10-CM | POA: Insufficient documentation

## 2020-02-24 DIAGNOSIS — E119 Type 2 diabetes mellitus without complications: Secondary | ICD-10-CM | POA: Diagnosis not present

## 2020-02-24 DIAGNOSIS — Z7982 Long term (current) use of aspirin: Secondary | ICD-10-CM | POA: Insufficient documentation

## 2020-02-24 DIAGNOSIS — K219 Gastro-esophageal reflux disease without esophagitis: Secondary | ICD-10-CM | POA: Diagnosis not present

## 2020-02-24 DIAGNOSIS — Z79899 Other long term (current) drug therapy: Secondary | ICD-10-CM | POA: Insufficient documentation

## 2020-02-24 DIAGNOSIS — Z955 Presence of coronary angioplasty implant and graft: Secondary | ICD-10-CM | POA: Diagnosis not present

## 2020-02-24 DIAGNOSIS — D6869 Other thrombophilia: Secondary | ICD-10-CM | POA: Insufficient documentation

## 2020-02-24 DIAGNOSIS — I480 Paroxysmal atrial fibrillation: Secondary | ICD-10-CM | POA: Insufficient documentation

## 2020-02-24 DIAGNOSIS — G4733 Obstructive sleep apnea (adult) (pediatric): Secondary | ICD-10-CM | POA: Insufficient documentation

## 2020-02-24 DIAGNOSIS — E7800 Pure hypercholesterolemia, unspecified: Secondary | ICD-10-CM | POA: Diagnosis not present

## 2020-02-24 MED ORDER — DILTIAZEM HCL ER COATED BEADS 120 MG PO CP24
120.0000 mg | ORAL_CAPSULE | Freq: Every day | ORAL | 2 refills | Status: DC
Start: 2020-02-24 — End: 2020-06-18

## 2020-02-24 MED ORDER — APIXABAN 5 MG PO TABS
5.0000 mg | ORAL_TABLET | Freq: Two times a day (BID) | ORAL | 3 refills | Status: DC
Start: 2020-02-24 — End: 2020-07-16

## 2020-02-24 NOTE — Patient Instructions (Addendum)
Start Diltiazem 130m daily

## 2020-02-24 NOTE — Addendum Note (Signed)
Encounter addended by: Juluis Mire, RN on: 02/24/2020 2:58 PM  Actions taken: Order list changed

## 2020-02-24 NOTE — Progress Notes (Signed)
Primary Care Physician: Redmond School, MD Primary Cardiologist: Dr Harl Bowie Primary Electrophysiologist: Dr Lovena Le Referring Physician: Dr Marni Griffon Barbara is a 79 y.o. female with a history of HTN, several prior CVAs, CAD, liver cirrhosis, esophageal varices s/p banding and splenorenal shunt, OSA, DM, and paroxysmal atrial fibrillation who presents for follow up in the [facility]. The patient was initially diagnosed with atrial fibrillation 02/21/20 on ILR which had been placed for cryptogenic stroke. Patient has a CHADS2VASC score of 8. She remains in afib today with elevated heart rates. She has no awareness of her arrhythmia. Patient reports she was taken off of diltiazem after her most recent hospitalization although I see no mention of this in the DC summary. She denies significant snoring or alcohol use. She does have a h/o rheumatic fever as a child.   Today, she denies symptoms of palpitations, chest pain, shortness of breath, orthopnea, PND, lower extremity edema, dizziness, presyncope, syncope, snoring, daytime somnolence, bleeding, or neurologic sequela. The patient is tolerating medications without difficulties and is otherwise without complaint today.    Atrial Fibrillation Risk Factors:  she does have symptoms or diagnosis of sleep apnea. she is not compliant with CPAP therapy. she does have a history of rheumatic fever. she does not have a history of alcohol use. The patient does not have a history of early familial atrial fibrillation or other arrhythmias.  she has a BMI of Body mass index is 24.55 kg/m.Marland Kitchen Filed Weights   02/24/20 1043  Weight: 62.9 kg    Family History  Problem Relation Age of Onset  . Diabetes Mother   . Heart failure Father   . Heart failure Maternal Aunt      Atrial Fibrillation Management history:  Previous antiarrhythmic drugs: none Previous cardioversions: none Previous ablations: none CHADS2VASC  score: 8 Anticoagulation history: none, ASA and Plavix   Past Medical History:  Diagnosis Date  . Anemia   . Aortic insufficiency    Moderate  . Back pain   . Carotid stenosis, bilateral   . Cirrhosis ([place])   . Closed left femoral fracture ([place]) 04/21/2017  . Coronary artery disease    Stent x 2 RCA 1995, cardiac catheterization 09/2016 showing only mild atherosclerosis  . DDD (degenerative disc disease), lumbar   . Essential hypertension   . Falls   . GERD (gastroesophageal reflux disease)   . Hiatal hernia   . History of stroke   . Hypercholesteremia   . IBS (irritable bowel syndrome)   . Non-alcoholic fatty liver disease   . OSA (obstructive sleep apnea)    no CPAP  . Pericardial effusion    a. small by echo 2018.  Marland Kitchen PSVT (paroxysmal supraventricular tachycardia) ([place])   . Recurrent UTI   . Seizures ([place])   . Stroke ([place])   . Type 2 diabetes mellitus ([place])    off medication  . Varices, esophageal ([place])    Past Surgical History:  Procedure Laterality Date  . [place]  . APPENDECTOMY    . BACK SURGERY  1985  . CARDIAC CATHETERIZATION  [phone]   Stent to the proximal RCA after MI   . CARDIAC CATHETERIZATION N/A 09/26/2016   Procedure: Left Heart Cath and Coronary Angiography;  Surgeon: Leonie Man, MD;  Location: [facility];  Service: Cardiovascular;  Laterality: N/A;  . CATARACT EXTRACTION W/PHACO Right 07/04/2014   Procedure: CATARACT EXTRACTION PHACO AND INTRAOCULAR LENS PLACEMENT (IOC);  Surgeon: Elta Guadeloupe T. Gershon Crane, MD;  [facility];  Service: Ophthalmology;  Laterality: Right;  CDE:13.13  . COLONOSCOPY    . DILATION AND CURETTAGE OF UTERUS     x2  . Epi Retinal Membrane Peel Left   . ERCP    . ESOPHAGEAL BANDING N/A 04/01/2013   Procedure: ESOPHAGEAL BANDING;  Surgeon: Rogene Houston, MD;  [facility];  Service: Endoscopy;  Laterality: N/A;  . ESOPHAGEAL BANDING N/A 05/24/2013   Procedure: ESOPHAGEAL BANDING;   Surgeon: Rogene Houston, MD;  [facility];  Service: Endoscopy;  Laterality: N/A;  . ESOPHAGEAL BANDING N/A 06/21/2014   Procedure: ESOPHAGEAL BANDING;  Surgeon: Rogene Houston, MD;  [facility];  Service: Endoscopy;  Laterality: N/A;  . ESOPHAGOGASTRODUODENOSCOPY N/A 04/01/2013   Procedure: ESOPHAGOGASTRODUODENOSCOPY (EGD);  Surgeon: Rogene Houston, MD;  [facility];  Service: Endoscopy;  Laterality: N/A;  230-rescheduled to 8:30am Ann notified pt  . ESOPHAGOGASTRODUODENOSCOPY N/A 05/24/2013   Procedure: ESOPHAGOGASTRODUODENOSCOPY (EGD);  Surgeon: Rogene Houston, MD;  [facility];  Service: Endoscopy;  Laterality: N/A;  730  . ESOPHAGOGASTRODUODENOSCOPY N/A 06/21/2014   Procedure: ESOPHAGOGASTRODUODENOSCOPY (EGD);  Surgeon: Rogene Houston, MD;  [facility];  Service: Endoscopy;  Laterality: N/A;  930-rescheduled 10/14 @ 1200 Ann to notify pt  . EYE SURGERY  08   cataract surgery of the left eye  . FEMUR IM NAIL Left 03/02/2017   Procedure: INTRAMEDULLARY (IM) NAIL FEMORAL;  Surgeon: Rod Can, MD;  Location: [facility];  Service: Orthopedics;  Laterality: Left;  . HARDWARE REMOVAL Right 01/17/2013   Procedure: REMOVAL OF HARDWARE AND EXCISION ULNAR STYLOID RIGHT WRIST;  Surgeon: Tennis Must, MD;  Location: [facility];  Service: Orthopedics;  Laterality: Right;  . MYRINGOTOMY  2012   both ears  . ORIF FEMUR FRACTURE Left 04/23/2017   Procedure: OPEN REDUCTION INTERNAL FIXATION (ORIF) DISTAL FEMUR FRACTURE (FRACTURE AROUND FEMORAL NAIL);  Surgeon: Altamese [place], MD;  Location: [facility];  Service: Orthopedics;  Laterality: Left;  . TONSILLECTOMY    . VAGINAL HYSTERECTOMY  1972  . WRIST SURGERY     rt wrist hardwear removal    Current Outpatient Medications  Medication Sig Dispense Refill  . aspirin EC 81 MG tablet Take 81 mg by mouth daily.    Marland Kitchen aspirin-acetaminophen-caffeine (EXCEDRIN MIGRAINE) 250-250-65 MG tablet  Take 1 tablet by mouth every 6 (six) hours as needed for headache.    Marland Kitchen atorvastatin (LIPITOR) 80 MG tablet Take 1 tablet (80 mg total) by mouth daily. 30 tablet 1  . calcium citrate (CALCITRATE - DOSED IN MG ELEMENTAL CALCIUM) 950 MG tablet Take 1 tablet (200 mg of elemental calcium total) by mouth daily. 30 tablet 1  . clopidogrel (PLAVIX) 75 MG tablet Take 1 tablet (75 mg total) by mouth daily with breakfast. 30 tablet 1  . docusate sodium (COLACE) 100 MG capsule Take 100 mg by mouth daily.     . furosemide (LASIX) 20 MG tablet Take 20 mg by mouth daily.     Marland Kitchen HYDROcodone-acetaminophen (NORCO) 10-325 MG tablet Take 1 tablet by mouth as needed for pain.    . metoprolol succinate (TOPROL-XL) 25 MG 24 hr tablet Take 25 mg by mouth daily.    . mirtazapine (REMERON) 15 MG tablet Take 15 mg by mouth at bedtime.    . Multiple Vitamins-Minerals (CENTRUM ADULTS) TABS Take 1 tablet by mouth daily.    Marland Kitchen  niacin (NIASPAN) 500 MG CR tablet Take 1 tablet by mouth daily.    Marland Kitchen omeprazole (PRILOSEC) 20 MG capsule Take 1 capsule (20 mg total) by mouth daily. 90 capsule 3  . zolpidem (AMBIEN) 5 MG tablet Take 1 tablet (5 mg total) by mouth at bedtime as needed for sleep. 10 tablet 0  . diltiazem (CARDIZEM CD) 120 MG 24 hr capsule Take 1 capsule (120 mg total) by mouth daily. 30 capsule 2   No current facility-administered medications for this encounter.    Allergies  Allergen Reactions  . Tape Rash and Other (See Comments)    Please use paper tape    Social History   Socioeconomic History  . Marital status: Widowed    Spouse name: Not on file  . Number of children: 2  . Years of education: 2 years college  . Highest education level: Some college, no degree  Occupational History    Employer: RETIRED  Tobacco Use  . Smoking status: Former Smoker    Packs/day: 0.25    Years: 50.00    Pack years: 12.50    Types: Cigarettes    Quit date: 11/03/2017    Years since quitting: 2.3  . Smokeless  tobacco: Never Used  . Tobacco comment: 1 pk lasts a week.   Vaping Use  . Vaping Use: Never used  Substance and Sexual Activity  . Alcohol use: No    Alcohol/week: 0.0 standard drinks  . Drug use: No  . Sexual activity: Not Currently    Birth control/protection: Surgical  Other Topics Concern  . Not on file  Social History Narrative  . Not on file   Social Determinants of Health   Financial Resource Strain: Low Risk   . Difficulty of Paying Living Expenses: Not hard at all  Food Insecurity: No Food Insecurity  . Worried About Charity fundraiser in the Last Year: Never true  . Ran Out of Food in the Last Year: Never true  Transportation Needs: Unmet Transportation Needs  . Lack of Transportation (Medical): Yes  . Lack of Transportation (Non-Medical): Yes  Physical Activity: Inactive  . Days of Exercise per Week: 0 days  . Minutes of Exercise per Session: 0 min  Stress: No Stress Concern Present  . Feeling of Stress : Not at all  Social Connections: Moderately Isolated  . Frequency of Communication with Friends and Family: More than three times a week  . Frequency of Social Gatherings with Friends and Family: Never  . Attends Religious Services: 1 to 4 times per year  . Active Member of Clubs or Organizations: No  . Attends Archivist Meetings: Never  . Marital Status: Widowed  Intimate Partner Violence: Not At Risk  . Fear of Current or Ex-Partner: No  . Emotionally Abused: No  . Physically Abused: No  . Sexually Abused: No     ROS- All systems are reviewed and negative except as per the HPI above.  Physical Exam: Vitals:   02/24/20 1043  BP: 124/60  Pulse: (!) 112  Weight: 62.9 kg  Height: 5' 3"  (1.6 m)    GEN- The patient is well appearing elderly female, alert and oriented x 3 today.   Head- normocephalic, atraumatic Eyes-  Sclera clear, conjunctiva pink Ears- hearing intact Oropharynx- clear Neck- supple  Lungs- Clear to ausculation  bilaterally, normal work of breathing Heart- irregular rate and rhythm, no murmurs, rubs or gallops  GI- soft, NT, ND, + BS Extremities- no clubbing,  cyanosis, or edema MS- no significant deformity or atrophy Skin- no rash or lesion Psych- euthymic mood, full affect Neuro- strength and sensation are intact  Wt Readings from Last 3 Encounters:  02/24/20 62.9 kg  02/08/20 60.8 kg  01/18/20 56.7 kg    EKG today demonstrates afib HR 112, LAFB, QRS 86, QTc 434  Echo 01/05/20 demonstrated  1. Left ventricular ejection fraction, by estimation, is 60 to 65%. The  left ventricle has normal function. The left ventricle has no regional  wall motion abnormalities. There is mild left ventricular hypertrophy.  Left ventricular diastolic parameters  are consistent with Grade II diastolic dysfunction (pseudonormalization).  Elevated left atrial pressure.  2. Right ventricular systolic function is normal. The right ventricular  size is normal. There is mildly elevated pulmonary artery systolic  pressure.  3. Left atrial size was severely dilated.  4. The mitral valve was not well visualized. Mild mitral valve  regurgitation. Moderate mitral stenosis.MV peak gradient, 13.4 mmHg. The  mean mitral valve gradient is 5.0 mmHg.  5. The aortic valve was not well visualized. Aortic valve regurgitation  is moderate. Moderate aortic valve stenosis.  6. The inferior vena cava is normal in size with greater than 50%  respiratory variability, suggesting right atrial pressure of 3 mmHg.   Epic records are reviewed at length today  CHA2DS2-VASc Score = 8  The patient's score is based upon: CHF History: 0 HTN History: 1 Age : 2 Diabetes History: 1 Stroke History: 2 Vascular Disease History: 1 Gender: 1      ASSESSMENT AND PLAN: 1. Paroxysmal Atrial Fibrillation (ICD10:  I48.0) The patient's CHA2DS2-VASc score is 8, indicating a 10.8% annual risk of stroke.   General education about afib  provided and questions answered. We also discussed her stroke risk and the risks and benefits of anticoagulation. Also discussed with Dr Lovena Le. She has an increased risk of bleeding with her h/o cirrhosis and esophageal varices. However, her risk of recurrent stroke with afib is significant. Her last Cmet showed normal LFTs. After discussion with patient and family, will stop ASA and Plavix and start Eliquis 5 mg BID. Patient voices understanding of bleeding vs stroke risk and is agreeable to the plan.  Resume diltiazem 120 mg daily for rate control. Continue Toprol 25 mg daily  2. Secondary Hypercoagulable State (ICD10:  D68.69) The patient is at significant risk for stroke/thromboembolism based upon her CHA2DS2-VASc Score of 8.  Start Apixaban (Eliquis).   3. Obstructive sleep apnea The importance of adequate treatment of sleep apnea was discussed today in order to improve our ability to maintain sinus rhythm long term. Patient is not on CPAP.   4. CAD No anginal symptoms.  5. HTN Stable, med changes as above.    Follow up in the AF clinic in 2 weeks. [facility].   [facility] [address] [place], [place] [address] [phone] 02/24/2020 2:08 PM

## 2020-02-27 ENCOUNTER — Telehealth: Payer: Self-pay

## 2020-02-27 NOTE — Telephone Encounter (Signed)
[facility] alert received 02/26/20 for ongoing AF w/ RVR 170-190's. Was seen in AF clinic 02/24/20 eliquis started, resumed cardizem and on toprol.  Currently taking Toprol 25 mg daily, Eliquis 5 mg BID, Cardizem 120 mg daily.  Patient sent manual transmission 02/27/20. Presenting AF w/ RVR 100-150's. Patient asymptomatic.  Forwarded to AF clinic for update. FU in AF clinic in 2 weeks.

## 2020-02-27 NOTE — Telephone Encounter (Signed)
Pt notified to increase metoprolol to 16m twice a day per CGeneral MotorsPA. Pt to continue to monitor bp/hr. Pt verbalized understanding of recommendations.

## 2020-03-06 NOTE — Telephone Encounter (Signed)
Error

## 2020-03-07 DIAGNOSIS — M1991 Primary osteoarthritis, unspecified site: Secondary | ICD-10-CM | POA: Diagnosis not present

## 2020-03-07 DIAGNOSIS — I10 Essential (primary) hypertension: Secondary | ICD-10-CM | POA: Diagnosis not present

## 2020-03-07 DIAGNOSIS — M5136 Other intervertebral disc degeneration, lumbar region: Secondary | ICD-10-CM | POA: Diagnosis not present

## 2020-03-07 DIAGNOSIS — E7849 Other hyperlipidemia: Secondary | ICD-10-CM | POA: Diagnosis not present

## 2020-03-09 ENCOUNTER — Encounter (HOSPITAL_COMMUNITY): Payer: Self-pay | Admitting: Physician Assistant

## 2020-03-09 ENCOUNTER — Ambulatory Visit (HOSPITAL_COMMUNITY)
Admission: RE | Admit: 2020-03-09 | Discharge: 2020-03-09 | Disposition: A | Discharge status: Home / Self Care | Payer: Medicare Other | Source: Ambulatory Visit | Attending: Physician Assistant | Admitting: Physician Assistant

## 2020-03-09 ENCOUNTER — Other Ambulatory Visit: Payer: Self-pay

## 2020-03-09 VITALS — BP 152/58 | HR 61 | Ht 63.0 in | Wt 145.2 lb

## 2020-03-09 DIAGNOSIS — I8510 Secondary esophageal varices without bleeding: Secondary | ICD-10-CM | POA: Diagnosis not present

## 2020-03-09 DIAGNOSIS — K219 Gastro-esophageal reflux disease without esophagitis: Secondary | ICD-10-CM | POA: Insufficient documentation

## 2020-03-09 DIAGNOSIS — G4733 Obstructive sleep apnea (adult) (pediatric): Secondary | ICD-10-CM | POA: Diagnosis not present

## 2020-03-09 DIAGNOSIS — K7460 Unspecified cirrhosis of liver: Secondary | ICD-10-CM | POA: Insufficient documentation

## 2020-03-09 DIAGNOSIS — I4819 Other persistent atrial fibrillation: Secondary | ICD-10-CM | POA: Diagnosis not present

## 2020-03-09 DIAGNOSIS — Z79899 Other long term (current) drug therapy: Secondary | ICD-10-CM | POA: Diagnosis not present

## 2020-03-09 DIAGNOSIS — I2510 Atherosclerotic heart disease of native coronary artery without angina pectoris: Secondary | ICD-10-CM | POA: Diagnosis not present

## 2020-03-09 DIAGNOSIS — F17210 Nicotine dependence, cigarettes, uncomplicated: Secondary | ICD-10-CM | POA: Insufficient documentation

## 2020-03-09 DIAGNOSIS — Z8619 Personal history of other infectious and parasitic diseases: Secondary | ICD-10-CM | POA: Diagnosis not present

## 2020-03-09 DIAGNOSIS — I352 Nonrheumatic aortic (valve) stenosis with insufficiency: Secondary | ICD-10-CM | POA: Insufficient documentation

## 2020-03-09 DIAGNOSIS — E7800 Pure hypercholesterolemia, unspecified: Secondary | ICD-10-CM | POA: Diagnosis not present

## 2020-03-09 DIAGNOSIS — I10 Essential (primary) hypertension: Secondary | ICD-10-CM | POA: Insufficient documentation

## 2020-03-09 DIAGNOSIS — E119 Type 2 diabetes mellitus without complications: Secondary | ICD-10-CM | POA: Diagnosis not present

## 2020-03-09 DIAGNOSIS — D6869 Other thrombophilia: Secondary | ICD-10-CM | POA: Diagnosis not present

## 2020-03-09 DIAGNOSIS — Z9119 Patient's noncompliance with other medical treatment and regimen: Secondary | ICD-10-CM | POA: Insufficient documentation

## 2020-03-09 DIAGNOSIS — Z8673 Personal history of transient ischemic attack (TIA), and cerebral infarction without residual deficits: Secondary | ICD-10-CM | POA: Diagnosis not present

## 2020-03-09 DIAGNOSIS — Z7901 Long term (current) use of anticoagulants: Secondary | ICD-10-CM | POA: Insufficient documentation

## 2020-03-09 LAB — CBC
HCT: 35.9 % — ABNORMAL LOW (ref 36.0–46.0)
Hemoglobin: 11.1 g/dL — ABNORMAL LOW (ref 12.0–15.0)
MCH: 29.9 pg (ref 26.0–34.0)
MCHC: 30.9 g/dL (ref 30.0–36.0)
MCV: 96.8 fL (ref 80.0–100.0)
Platelets: 120 10*3/uL — ABNORMAL LOW (ref 150–400)
RBC: 3.71 MIL/uL — ABNORMAL LOW (ref 3.87–5.11)
RDW: 13.8 % (ref 11.5–15.5)
WBC: 6 10*3/uL (ref 4.0–10.5)
nRBC: 0 % (ref 0.0–0.2)

## 2020-03-09 MED ORDER — METOPROLOL SUCCINATE ER 25 MG PO TB24: 25.0000 mg | ORAL_TABLET | Freq: Two times a day (BID) | ORAL | 6 refills | Status: DC

## 2020-03-09 NOTE — Progress Notes (Signed)
Primary Care Physician: Redmond School, MD Primary Cardiologist: Dr Harl Bowie Primary Electrophysiologist: Dr Lovena Le Referring Physician: Dr Marni Griffon [name] is a 79 y.o. female with a history of HTN, several prior CVAs, CAD, liver cirrhosis, esophageal varices s/p banding and splenorenal shunt, OSA, DM, and paroxysmal atrial fibrillation who presents for follow up in the [facility]. The patient was initially diagnosed with atrial fibrillation 02/21/20 on ILR which had been placed for cryptogenic stroke. Patient has a CHADS2VASC score of 8. She remains in afib today with elevated heart rates. She has no awareness of her arrhythmia. Patient reports she was taken off of diltiazem after her most recent hospitalization although I see no mention of this in the DC summary. She denies significant snoring or alcohol use. She does have a h/o rheumatic fever as a child.   On follow up today, patient reports she has done well since her last visit. The device clinic received an alert for ongoing afib with high rates on 02/27/20. Her BB was increased and she converted to SR. ILR shows SR since that time.   Today, she denies symptoms of palpitations, chest pain, shortness of breath, orthopnea, PND, lower extremity edema, dizziness, presyncope, syncope, snoring, daytime somnolence, bleeding, or neurologic sequela. The patient is tolerating medications without difficulties and is otherwise without complaint today.    Atrial Fibrillation Risk Factors:  she does have symptoms or diagnosis of sleep apnea. she is not compliant with CPAP therapy. she does have a history of rheumatic fever. she does not have a history of alcohol use. The patient does not have a history of early familial atrial fibrillation or other arrhythmias.  she has a BMI of Body mass index is 25.72 kg/m.Marland Kitchen Filed Weights   03/09/20 1035  Weight: 65.9 kg    Family History  Problem Relation Age of Onset  .  Diabetes Mother   . Heart failure Father   . Heart failure Maternal Aunt      Atrial Fibrillation Management history:  Previous antiarrhythmic drugs: none Previous cardioversions: none Previous ablations: none CHADS2VASC score: 8 Anticoagulation history: Eliquis   Past Medical History:  Diagnosis Date  . Anemia   . Aortic insufficiency    Moderate  . Back pain   . Carotid stenosis, bilateral   . Cirrhosis ([place])   . Closed left femoral fracture ([place]) 04/21/2017  . Coronary artery disease    Stent x 2 RCA 1995, cardiac catheterization 09/2016 showing only mild atherosclerosis  . DDD (degenerative disc disease), lumbar   . Essential hypertension   . Falls   . GERD (gastroesophageal reflux disease)   . Hiatal hernia   . History of stroke   . Hypercholesteremia   . IBS (irritable bowel syndrome)   . Non-alcoholic fatty liver disease   . OSA (obstructive sleep apnea)    no CPAP  . Pericardial effusion    a. small by echo 2018.  Marland Kitchen PSVT (paroxysmal supraventricular tachycardia) ([place])   . Recurrent UTI   . Seizures ([place])   . Stroke ([place])   . Type 2 diabetes mellitus ([place])    off medication  . Varices, esophageal ([place])    Past Surgical History:  Procedure Laterality Date  . [place]  . APPENDECTOMY    . BACK SURGERY  1985  . CARDIAC CATHETERIZATION  [phone]   Stent to the proximal RCA after MI   . CARDIAC CATHETERIZATION N/A 09/26/2016   Procedure: Left  Heart Cath and Coronary Angiography;  Surgeon: Leonie Man, MD;  Location: [facility];  Service: Cardiovascular;  Laterality: N/A;  . CATARACT EXTRACTION W/PHACO Right 07/04/2014   Procedure: CATARACT EXTRACTION PHACO AND INTRAOCULAR LENS PLACEMENT (IOC);  Surgeon: Elta Guadeloupe T. Gershon Crane, MD;  Location: [facility];  Service: Ophthalmology;  Laterality: Right;  CDE:13.13  . COLONOSCOPY    . DILATION AND CURETTAGE OF UTERUS     x2  . Epi Retinal Membrane Peel Left   . ERCP    . ESOPHAGEAL  BANDING N/A 04/01/2013   Procedure: ESOPHAGEAL BANDING;  Surgeon: Rogene Houston, MD;  [facility];  Service: Endoscopy;  Laterality: N/A;  . ESOPHAGEAL BANDING N/A 05/24/2013   Procedure: ESOPHAGEAL BANDING;  Surgeon: Rogene Houston, MD;  [facility];  Service: Endoscopy;  Laterality: N/A;  . ESOPHAGEAL BANDING N/A 06/21/2014   Procedure: ESOPHAGEAL BANDING;  Surgeon: Rogene Houston, MD;  [facility];  Service: Endoscopy;  Laterality: N/A;  . ESOPHAGOGASTRODUODENOSCOPY N/A 04/01/2013   Procedure: ESOPHAGOGASTRODUODENOSCOPY (EGD);  Surgeon: Rogene Houston, MD;  [facility];  Service: Endoscopy;  Laterality: N/A;  230-rescheduled to 8:30am Ann notified pt  . ESOPHAGOGASTRODUODENOSCOPY N/A 05/24/2013   Procedure: ESOPHAGOGASTRODUODENOSCOPY (EGD);  Surgeon: Rogene Houston, MD;  [facility];  Service: Endoscopy;  Laterality: N/A;  730  . ESOPHAGOGASTRODUODENOSCOPY N/A 06/21/2014   Procedure: ESOPHAGOGASTRODUODENOSCOPY (EGD);  Surgeon: Rogene Houston, MD;  [facility];  Service: Endoscopy;  Laterality: N/A;  930-rescheduled 10/14 @ 1200 Ann to notify pt  . EYE SURGERY  08   cataract surgery of the left eye  . FEMUR IM NAIL Left 03/02/2017   Procedure: INTRAMEDULLARY (IM) NAIL FEMORAL;  Surgeon: Rod Can, MD;  Location: [facility];  Service: Orthopedics;  Laterality: Left;  . HARDWARE REMOVAL Right 01/17/2013   Procedure: REMOVAL OF HARDWARE AND EXCISION ULNAR STYLOID RIGHT WRIST;  Surgeon: Tennis Must, MD;  Location: [facility];  Service: Orthopedics;  Laterality: Right;  . MYRINGOTOMY  2012   both ears  . ORIF FEMUR FRACTURE Left 04/23/2017   Procedure: OPEN REDUCTION INTERNAL FIXATION (ORIF) DISTAL FEMUR FRACTURE (FRACTURE AROUND FEMORAL NAIL);  Surgeon: Altamese [place], MD;  Location: [facility];  Service: Orthopedics;  Laterality: Left;  . TONSILLECTOMY    . VAGINAL HYSTERECTOMY  1972  . WRIST SURGERY     rt  wrist hardwear removal    Current Outpatient Medications  Medication Sig Dispense Refill  . apixaban (ELIQUIS) 5 MG TABS tablet Take 1 tablet (5 mg total) by mouth 2 (two) times daily. 60 tablet 3  . atorvastatin (LIPITOR) 80 MG tablet Take 1 tablet (80 mg total) by mouth daily. 30 tablet 1  . calcium citrate (CALCITRATE - DOSED IN MG ELEMENTAL CALCIUM) 950 MG tablet Take 1 tablet (200 mg of elemental calcium total) by mouth daily. 30 tablet 1  . diltiazem (CARDIZEM CD) 120 MG 24 hr capsule Take 1 capsule (120 mg total) by mouth daily. 30 capsule 2  . docusate sodium (COLACE) 100 MG capsule Take 100 mg by mouth daily.     . furosemide (LASIX) 20 MG tablet Take 20 mg by mouth daily.     Marland Kitchen HYDROcodone-acetaminophen (NORCO) 10-325 MG tablet Take 1 tablet by mouth as needed for pain.    . metoprolol succinate (TOPROL-XL) 25 MG 24 hr tablet Take 1 tablet (25 mg total) by mouth in the morning and at  bedtime. 60 tablet 6  . mirtazapine (REMERON) 15 MG tablet Take 15 mg by mouth at bedtime.    . Multiple Vitamins-Minerals (CENTRUM ADULTS) TABS Take 1 tablet by mouth daily.    . niacin (NIASPAN) 500 MG CR tablet Take 1 tablet by mouth daily.    Marland Kitchen omeprazole (PRILOSEC) 20 MG capsule Take 1 capsule (20 mg total) by mouth daily. 90 capsule 3  . zolpidem (AMBIEN) 5 MG tablet Take 1 tablet (5 mg total) by mouth at bedtime as needed for sleep. 10 tablet 0   No current facility-administered medications for this encounter.    Allergies  Allergen Reactions  . Tape Rash and Other (See Comments)    Please use paper tape    Social History   Socioeconomic History  . Marital status: Widowed    Spouse name: Not on file  . Number of children: 2  . Years of education: 2 years college  . Highest education level: Some college, no degree  Occupational History    Employer: RETIRED  Tobacco Use  . Smoking status: Smoker, Current Status Unknown    Packs/day: 0.25    Years: 50.00    Pack years: 12.50     Types: Cigarettes    Last attempt to quit: 11/03/2017    Years since quitting: 2.3  . Smokeless tobacco: Never Used  . Tobacco comment: 2 cigarettes daily  Vaping Use  . Vaping Use: Never used  Substance and Sexual Activity  . Alcohol use: No    Alcohol/week: 0.0 standard drinks  . Drug use: No  . Sexual activity: Not Currently    Birth control/protection: Surgical  Other Topics Concern  . Not on file  Social History Narrative  . Not on file   Social Determinants of Health   Financial Resource Strain: Low Risk   . Difficulty of Paying Living Expenses: Not hard at all  Food Insecurity: No Food Insecurity  . Worried About Charity fundraiser in the Last Year: Never true  . Ran Out of Food in the Last Year: Never true  Transportation Needs: Unmet Transportation Needs  . Lack of Transportation (Medical): Yes  . Lack of Transportation (Non-Medical): Yes  Physical Activity: Inactive  . Days of Exercise per Week: 0 days  . Minutes of Exercise per Session: 0 min  Stress: No Stress Concern Present  . Feeling of Stress : Not at all  Social Connections: Moderately Isolated  . Frequency of Communication with Friends and Family: More than three times a week  . Frequency of Social Gatherings with Friends and Family: Never  . Attends Religious Services: 1 to 4 times per year  . Active Member of Clubs or Organizations: No  . Attends Archivist Meetings: Never  . Marital Status: Widowed  Intimate Partner Violence: Not At Risk  . Fear of Current or Ex-Partner: No  . Emotionally Abused: No  . Physically Abused: No  . Sexually Abused: No     ROS- All systems are reviewed and negative except as per the HPI above.  Physical Exam: Vitals:   03/09/20 1035  BP: (!) 152/58  Pulse: 61  Weight: 65.9 kg  Height: 5' 3"  (1.6 m)    GEN- The patient is well appearing elderly female, alert and oriented x 3 today.   HEENT-head normocephalic, atraumatic, sclera clear, conjunctiva  pink, hearing intact, trachea midline. Lungs- Clear to ausculation bilaterally, normal work of breathing Heart- Regular rate and rhythm, no rubs or gallops. 2/6  systolic murmur GI- soft, NT, ND, + BS Extremities- no clubbing, cyanosis, or edema MS- no significant deformity or atrophy Skin- no rash or lesion Psych- euthymic mood, full affect Neuro- strength and sensation are intact   Wt Readings from Last 3 Encounters:  03/09/20 65.9 kg  02/24/20 62.9 kg  02/08/20 60.8 kg    EKG today demonstrates SR HR 61, 1st degree AV block, PR 218, QRS 100, QTc 473  Echo 01/05/20 demonstrated  1. Left ventricular ejection fraction, by estimation, is 60 to 65%. The  left ventricle has normal function. The left ventricle has no regional  wall motion abnormalities. There is mild left ventricular hypertrophy.  Left ventricular diastolic parameters  are consistent with Grade II diastolic dysfunction (pseudonormalization).  Elevated left atrial pressure.  2. Right ventricular systolic function is normal. The right ventricular  size is normal. There is mildly elevated pulmonary artery systolic  pressure.  3. Left atrial size was severely dilated.  4. The mitral valve was not well visualized. Mild mitral valve  regurgitation. Moderate mitral stenosis.MV peak gradient, 13.4 mmHg. The  mean mitral valve gradient is 5.0 mmHg.  5. The aortic valve was not well visualized. Aortic valve regurgitation  is moderate. Moderate aortic valve stenosis.  6. The inferior vena cava is normal in size with greater than 50%  respiratory variability, suggesting right atrial pressure of 3 mmHg.   Epic records are reviewed at length today  CHA2DS2-VASc Score = 8  The patient's score is based upon: CHF History: 0 HTN History: 1 Age : 2 Diabetes History: 1 Stroke History: 2 Vascular Disease History: 1 Gender: 1      ASSESSMENT AND PLAN: 1. Persistent Atrial Fibrillation The patient's CHA2DS2-VASc score  is 8, indicating a 10.8% annual risk of stroke.   Patient converted to SR on higher dose of BB on 02/27/20. Continue Eliquis 5 mg BID. Check CBC today. Continue diltiazem 120 mg daily  Continue Toprol 25 mg BID  2. Secondary Hypercoagulable State (ICD10:  D68.69) The patient is at significant risk for stroke/thromboembolism based upon her CHA2DS2-VASc Score of 8.  Start Apixaban (Eliquis).   3. Obstructive sleep apnea Patient is not on CPAP therapy.  4. CAD No anginal symptoms.  5. HTN Stable, no changes today.    Follow up with Dr Harl Bowie as scheduled. AF clinic in 6 months. Carelink.   [facility] [address] [place], [place] [address] [phone] 03/09/2020 11:02 AM

## 2020-03-13 ENCOUNTER — Ambulatory Visit (INDEPENDENT_AMBULATORY_CARE_PROVIDER_SITE_OTHER): Payer: Medicare Other | Admitting: *Deleted

## 2020-03-13 DIAGNOSIS — I639 Cerebral infarction, unspecified: Secondary | ICD-10-CM | POA: Diagnosis not present

## 2020-03-13 LAB — CUP PACEART REMOTE DEVICE CHECK
Date Time Interrogation Session: 20210706021329
Implantable Pulse Generator Implant Date: 20210602

## 2020-03-14 NOTE — Progress Notes (Signed)
Carelink Summary Report / Loop Recorder 

## 2020-04-06 DIAGNOSIS — I10 Essential (primary) hypertension: Secondary | ICD-10-CM | POA: Diagnosis not present

## 2020-04-06 DIAGNOSIS — M1991 Primary osteoarthritis, unspecified site: Secondary | ICD-10-CM | POA: Diagnosis not present

## 2020-04-06 DIAGNOSIS — E7849 Other hyperlipidemia: Secondary | ICD-10-CM | POA: Diagnosis not present

## 2020-04-06 DIAGNOSIS — M5136 Other intervertebral disc degeneration, lumbar region: Secondary | ICD-10-CM | POA: Diagnosis not present

## 2020-04-11 DIAGNOSIS — R600 Localized edema: Secondary | ICD-10-CM | POA: Diagnosis not present

## 2020-04-11 DIAGNOSIS — I639 Cerebral infarction, unspecified: Secondary | ICD-10-CM | POA: Diagnosis not present

## 2020-04-11 DIAGNOSIS — Z6821 Body mass index (BMI) 21.0-21.9, adult: Secondary | ICD-10-CM | POA: Diagnosis not present

## 2020-04-11 DIAGNOSIS — E119 Type 2 diabetes mellitus without complications: Secondary | ICD-10-CM | POA: Diagnosis not present

## 2020-04-11 DIAGNOSIS — I4891 Unspecified atrial fibrillation: Secondary | ICD-10-CM | POA: Diagnosis not present

## 2020-04-11 DIAGNOSIS — G4700 Insomnia, unspecified: Secondary | ICD-10-CM | POA: Diagnosis not present

## 2020-04-12 ENCOUNTER — Other Ambulatory Visit (HOSPITAL_COMMUNITY): Payer: Self-pay | Admitting: Internal Medicine

## 2020-04-12 DIAGNOSIS — R1310 Dysphagia, unspecified: Secondary | ICD-10-CM

## 2020-04-13 ENCOUNTER — Ambulatory Visit: Payer: Medicare Other | Admitting: Cardiology

## 2020-04-16 ENCOUNTER — Ambulatory Visit (INDEPENDENT_AMBULATORY_CARE_PROVIDER_SITE_OTHER): Payer: Medicare Other | Admitting: *Deleted

## 2020-04-16 DIAGNOSIS — R55 Syncope and collapse: Secondary | ICD-10-CM

## 2020-04-16 LAB — CUP PACEART REMOTE DEVICE CHECK
Date Time Interrogation Session: 20210808021241
Implantable Pulse Generator Implant Date: 20210602

## 2020-04-17 NOTE — Progress Notes (Signed)
Carelink Summary Report / Loop Recorder 

## 2020-04-18 ENCOUNTER — Other Ambulatory Visit: Payer: Self-pay

## 2020-04-18 ENCOUNTER — Ambulatory Visit (HOSPITAL_COMMUNITY)
Admission: RE | Admit: 2020-04-18 | Discharge: 2020-04-18 | Disposition: A | Discharge status: Home / Self Care | Payer: Medicare Other | Source: Ambulatory Visit | Attending: Internal Medicine | Admitting: Internal Medicine

## 2020-04-18 DIAGNOSIS — K224 Dyskinesia of esophagus: Secondary | ICD-10-CM | POA: Diagnosis not present

## 2020-04-18 DIAGNOSIS — R1310 Dysphagia, unspecified: Secondary | ICD-10-CM | POA: Insufficient documentation

## 2020-04-30 DIAGNOSIS — K224 Dyskinesia of esophagus: Secondary | ICD-10-CM | POA: Diagnosis not present

## 2020-04-30 DIAGNOSIS — R1310 Dysphagia, unspecified: Secondary | ICD-10-CM | POA: Diagnosis not present

## 2020-04-30 DIAGNOSIS — I4891 Unspecified atrial fibrillation: Secondary | ICD-10-CM | POA: Diagnosis not present

## 2020-04-30 DIAGNOSIS — I8500 Esophageal varices without bleeding: Secondary | ICD-10-CM | POA: Diagnosis not present

## 2020-05-10 ENCOUNTER — Telehealth: Payer: Self-pay

## 2020-05-10 NOTE — Telephone Encounter (Signed)
ILR alerts received 05/09/20 for AF burden 100% of time since 05/09/20. 1 episode of AF RVR, lasted 23 seconds. OAC + Eliquis 5 mg tablet BID, Cardizem CD 120 mg tablet daily, Toprol-XL 25 mg tablet BID.   Patient remains asymptomatic. Compliant with medications. Requested manual transmission. Presenting today shows AF w/ controlled VR.  Routing to [place], [place] in AF clinic for review and recommendations. Patient advised if there are no changes we will not call her back. Patient agreeable to plan.   Manual Request- Presenting 05/10/20    Alerts 05/09/20

## 2020-05-11 ENCOUNTER — Ambulatory Visit: Payer: Self-pay | Admitting: *Deleted

## 2020-05-11 ENCOUNTER — Other Ambulatory Visit: Payer: Medicare Other | Admitting: *Deleted

## 2020-05-11 NOTE — Patient Outreach (Signed)
[facility]) Care Management  05/11/2020  [name] [birth year] [id number]   Telephone Assessment-Requested a call back  RN spoke wit pt briefly with a request to call back.   Will attempt another outreach later today if unavailable will outreach next week.  Raina Mina, RN Care Management Coordinator [place] Office [phone]

## 2020-05-15 ENCOUNTER — Other Ambulatory Visit: Payer: Self-pay | Admitting: *Deleted

## 2020-05-15 ENCOUNTER — Ambulatory Visit: Payer: Medicare Other | Admitting: Neurology

## 2020-05-15 NOTE — Patient Outreach (Signed)
[name]  05/15/2020  [name] April 23, 1941 [id number]   Telephone Screen/Assessment-Refer Health Coach Referral Received 9/1 Outreach #3  RN spoke with pt today and explained the reason for today's call. Pt receptive and further provided information concerning her medical condition. Pt states her history of a stoke with left side residual and feels she needs to have PT in the home once again. RN verified no falls or incidents and offered to reach out to her provider with this request however pt indicated she was going to make this request from neurologist at the end of this month. Further engaged and inquired on her HTN as pt states her blood pressures is stable with last read of 120/60 no acute problems as pt verified she continues to takes all medications and attend all medical appointments.   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 RN further offered a Health Coach via [facility] for ongoing monitoring and possible resources if needed in the future with [facility] social worker or pharmacy needs. Pt has agreed to a Health Coach at this time for [facility] services.   No further needs or issues to address. Will make a referral to a health coach for ongoing monitoring.   Raina Mina, RN Care Management Coordinator [place] Office [phone]

## 2020-05-15 NOTE — Telephone Encounter (Signed)
Presenting rhythm on 9/6 NSR. Will continue to monitor patient. Per Adline Peals PA no changes warranted at this time.

## 2020-05-17 ENCOUNTER — Other Ambulatory Visit: Payer: Self-pay | Admitting: *Deleted

## 2020-05-21 ENCOUNTER — Ambulatory Visit (INDEPENDENT_AMBULATORY_CARE_PROVIDER_SITE_OTHER): Payer: Medicare Other | Admitting: *Deleted

## 2020-05-21 DIAGNOSIS — R55 Syncope and collapse: Secondary | ICD-10-CM

## 2020-05-21 LAB — CUP PACEART REMOTE DEVICE CHECK
Date Time Interrogation Session: 20210910021423
Implantable Pulse Generator Implant Date: 20210602

## 2020-05-22 NOTE — Progress Notes (Signed)
Carelink Summary Report / Loop Recorder 

## 2020-06-04 ENCOUNTER — Encounter (INDEPENDENT_AMBULATORY_CARE_PROVIDER_SITE_OTHER): Payer: Self-pay | Admitting: *Deleted

## 2020-06-04 ENCOUNTER — Ambulatory Visit (INDEPENDENT_AMBULATORY_CARE_PROVIDER_SITE_OTHER): Payer: Medicare Other | Admitting: Gastroenterology

## 2020-06-04 ENCOUNTER — Other Ambulatory Visit: Payer: Self-pay

## 2020-06-04 ENCOUNTER — Other Ambulatory Visit (INDEPENDENT_AMBULATORY_CARE_PROVIDER_SITE_OTHER): Payer: Self-pay | Admitting: *Deleted

## 2020-06-04 ENCOUNTER — Encounter (INDEPENDENT_AMBULATORY_CARE_PROVIDER_SITE_OTHER): Payer: Self-pay | Admitting: Gastroenterology

## 2020-06-04 VITALS — BP 163/66 | Temp 97.1°F | Ht 63.0 in | Wt 152.3 lb

## 2020-06-04 DIAGNOSIS — Z8719 Personal history of other diseases of the digestive system: Secondary | ICD-10-CM | POA: Diagnosis not present

## 2020-06-04 DIAGNOSIS — R933 Abnormal findings on diagnostic imaging of other parts of digestive tract: Secondary | ICD-10-CM

## 2020-06-04 DIAGNOSIS — K7581 Nonalcoholic steatohepatitis (NASH): Secondary | ICD-10-CM

## 2020-06-04 DIAGNOSIS — K7460 Unspecified cirrhosis of liver: Secondary | ICD-10-CM

## 2020-06-04 DIAGNOSIS — Z7901 Long term (current) use of anticoagulants: Secondary | ICD-10-CM

## 2020-06-04 DIAGNOSIS — I8500 Esophageal varices without bleeding: Secondary | ICD-10-CM

## 2020-06-04 NOTE — Progress Notes (Signed)
Patient profile: [name] is a 79 y.o. female seen for evaluation of abnormal barium swallow.  History of Present Illness: [name] is seen today for follow-up.  She is accompanied by her granddaughter who she lives with and helps with history.  She reports several months of dysphagia symptoms.  The symptoms were evaluated in August 2021 with a barium swallow as below.  She notes mainly dysphagia to pills - Lasix pill was made the most problematic.  She has started crushing her medications and she is putting in Ensure which has significantly helped w/ pill dysphagia.  These seemed to lodged in the throat/upper esophagus prior to crushing.  She denies any food or liquid dysphagia.  She does not feel any GERD symptoms on low-dose omeprazole.  She is not having any nausea or vomiting.   She reports previously having constipation but has started stool softener twice a day.  Now has occasional loose stools.  Denies any blood in stool, melena, rectal bleeding or abdominal pain. Overall bowels seem stable.   She has started her on Remeron and is gaining weight recently.  Wt Readings from Last 3 Encounters:  06/04/20 152 lb 4.8 oz (69.1 kg)  03/09/20 145 lb 3.2 oz (65.9 kg)  02/24/20 138 lb 9.6 oz (62.9 kg)     Last Colonoscopy: Per patient many years ago Last Endoscopy: 2015-Impression: Two columns of esophageal varices were banded. Portal gastropathy. 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     Past Medical History:  Past Medical History:  Diagnosis Date  . Anemia   . Aortic insufficiency    Moderate  . Back pain   . Carotid stenosis, bilateral   . Cirrhosis ([place])   . Closed left femoral fracture ([place]) 04/21/2017  . Coronary artery disease    Stent x 2 RCA 1995, cardiac catheterization 09/2016 showing only mild atherosclerosis  . DDD (degenerative disc disease), lumbar   . Essential hypertension   . Falls   . GERD (gastroesophageal reflux disease)     . Hiatal hernia   . History of stroke   . Hypercholesteremia   . IBS (irritable bowel syndrome)   . Non-alcoholic fatty liver disease   . OSA (obstructive sleep apnea)    no CPAP  . Pericardial effusion    a. small by echo 2018.  Marland Kitchen PSVT (paroxysmal supraventricular tachycardia) ([place])   . Recurrent UTI   . Seizures ([place])   . Stroke ([place])   . Type 2 diabetes mellitus ([place])    off medication  . Varices, esophageal ([place])     Problem List: Patient Active Problem List   Diagnosis Date Noted  . Persistent atrial fibrillation ([place]) 03/09/2020  . Paroxysmal atrial fibrillation ([place]) 02/24/2020  . Secondary hypercoagulable state ([place]) 02/24/2020  . Acute CVA (cerebrovascular accident) ([place]) 01/03/2020  . Tobacco use disorder 07/09/2018  . Small vessel disease ([place]) 07/09/2018  . Benign essential HTN   . Seizures ([place])   . Tachypnea   . Dysphagia, post-stroke   . Dysarthria 05/19/2018  . Syncope and collapse   . Syncope 08/24/2017  . Closed left femoral fracture ([place]) 04/21/2017  . Seizure disorder ([place]) 04/21/2017  . Closed pertrochanteric fracture of femur, left, initial encounter ([place]) 03/02/2017  . Seizure ([place]) 02/28/2017  . Hip fracture ([place]) 02/28/2017  . Diabetes mellitus without complication ([place]) 16/06/9603  . Epiretinal membrane (ERM) of left eye 01/20/2017  . Pseudophakia 01/20/2017  . Chest pain 12/07/2016  . Atypical chest pain  12/07/2016  . Tachycardia   . NSTEMI (non-ST elevated myocardial infarction) ([place]) 11/02/2016  . Multifocal atrial tachycardia ([place]) 09/25/2016  . Elevated troponin 09/24/2016  . Hypokalemia 09/23/2016  . TIA (transient ischemic attack) 05/05/2016  . Intractable nausea and vomiting 10/15/2015  . Acute coronary syndrome ([place]) 06/01/2015  . Bronchitis 06/01/2015  . Thrombocytopenia ([place]) 06/01/2015  . Cerebral infarction ([place]) right occipital due to small vessel disease s/p tPA 03/06/2014  . Lower urinary tract infectious disease   .  PSVT (paroxysmal supraventricular tachycardia) ([place]) 12/09/2013  . Esophageal varices ([place]) 03/29/2013  . Hematemesis 03/29/2013  . Hepatic cirrhosis ([place]) 03/29/2013  . Presence of stent in right coronary artery 07/04/2011  . OSA (obstructive sleep apnea) 07/04/2011  . Aortic sclerosis 07/04/2011  . Aortic insufficiency 07/04/2011  . HTN (hypertension) 07/04/2011  . Carotid stenosis, bilateral 07/04/2011  . Chest pain at rest 07/03/2011  . CAD (coronary artery disease) 07/03/2011  . Hypercholesteremia 07/03/2011  . GERD (gastroesophageal reflux disease) 07/03/2011    Past Surgical History: Past Surgical History:  Procedure Laterality Date  . [place]  . APPENDECTOMY    . BACK SURGERY  1985  . CARDIAC CATHETERIZATION  [id number]   Stent to the proximal RCA after MI   . CARDIAC CATHETERIZATION N/A 09/26/2016   Procedure: Left Heart Cath and Coronary Angiography;  Surgeon: Leonie Man, MD;  Location: [facility];  Service: Cardiovascular;  Laterality: N/A;  . CATARACT EXTRACTION W/PHACO Right 07/04/2014   Procedure: CATARACT EXTRACTION PHACO AND INTRAOCULAR LENS PLACEMENT (IOC);  Surgeon: Elta Guadeloupe T. Gershon Crane, MD;  Location: [facility];  Service: Ophthalmology;  Laterality: Right;  CDE:13.13  . COLONOSCOPY    . DILATION AND CURETTAGE OF UTERUS     x2  . Epi Retinal Membrane Peel Left   . ERCP    . ESOPHAGEAL BANDING N/A 04/01/2013   Procedure: ESOPHAGEAL BANDING;  Surgeon: Rogene Houston, MD;  Location[facility];  Service: Endoscopy;  Laterality: N/A;  . ESOPHAGEAL BANDING N/A 05/24/2013   Procedure: ESOPHAGEAL BANDING;  Surgeon: Rogene Houston, MD;  [facility];  Service: Endoscopy;  Laterality: N/A;  . ESOPHAGEAL BANDING N/A 06/21/2014   Procedure: ESOPHAGEAL BANDING;  Surgeon: Rogene Houston, MD;  [facility];  Service: Endoscopy;  Laterality: N/A;  . ESOPHAGOGASTRODUODENOSCOPY N/A 04/01/2013   Procedure:  ESOPHAGOGASTRODUODENOSCOPY (EGD);  Surgeon: Rogene Houston, MD;  [facility];  Service: Endoscopy;  Laterality: N/A;  230-rescheduled to 8:30am Ann notified pt  . ESOPHAGOGASTRODUODENOSCOPY N/A 05/24/2013   Procedure: ESOPHAGOGASTRODUODENOSCOPY (EGD);  Surgeon: Rogene Houston, MD;  [facility];  Service: Endoscopy;  Laterality: N/A;  730  . ESOPHAGOGASTRODUODENOSCOPY N/A 06/21/2014   Procedure: ESOPHAGOGASTRODUODENOSCOPY (EGD);  Surgeon: Rogene Houston, MD;  [facility];  Service: Endoscopy;  Laterality: N/A;  930-rescheduled 10/14 @ 1200 Ann to notify pt  . EYE SURGERY  08   cataract surgery of the left eye  . FEMUR IM NAIL Left 03/02/2017   Procedure: INTRAMEDULLARY (IM) NAIL FEMORAL;  Surgeon: Rod Can, MD;  Location: [facility];  Service: Orthopedics;  Laterality: Left;  . HARDWARE REMOVAL Right 01/17/2013   Procedure: REMOVAL OF HARDWARE AND EXCISION ULNAR STYLOID RIGHT WRIST;  Surgeon: Tennis Must, MD;  Location: [facility];  Service: Orthopedics;  Laterality: Right;  . MYRINGOTOMY  2012   both ears  . ORIF FEMUR FRACTURE Left 04/23/2017   Procedure: OPEN  REDUCTION INTERNAL FIXATION (ORIF) DISTAL FEMUR FRACTURE (FRACTURE AROUND FEMORAL NAIL);  Surgeon: Altamese Goodland, MD;  Location: [facility];  Service: Orthopedics;  Laterality: Left;  . TONSILLECTOMY    . VAGINAL HYSTERECTOMY  1972  . WRIST SURGERY     rt wrist hardwear removal    Allergies: Allergies  Allergen Reactions  . Tape Rash and Other (See Comments)    Please use paper tape      Home Medications:  Current Outpatient Medications:  .  Acetaminophen 160 MG PACK, Take 500 mg by mouth daily., Disp: , Rfl:  .  apixaban (ELIQUIS) 5 MG TABS tablet, Take 1 tablet (5 mg total) by mouth 2 (two) times daily., Disp: 60 tablet, Rfl: 3 .  atorvastatin (LIPITOR) 80 MG tablet, Take 1 tablet (80 mg total) by mouth daily., Disp: 30 tablet, Rfl: 1 .  calcium citrate (CALCITRATE - DOSED  IN MG ELEMENTAL CALCIUM) 950 MG tablet, Take 1 tablet (200 mg of elemental calcium total) by mouth daily., Disp: 30 tablet, Rfl: 1 .  Coenzyme Q10 10 MG capsule, Take 10 mg by mouth daily., Disp: , Rfl:  .  diltiazem (CARDIZEM CD) 120 MG 24 hr capsule, Take 1 capsule (120 mg total) by mouth daily., Disp: 30 capsule, Rfl: 2 .  docusate sodium (COLACE) 100 MG capsule, Take 100 mg by mouth 2 (two) times daily. , Disp: , Rfl:  .  fluticasone (FLONASE) 50 MCG/ACT nasal spray, Place 2 sprays into both nostrils daily., Disp: , Rfl:  .  furosemide (LASIX) 20 MG tablet, Take 20 mg by mouth daily. , Disp: , Rfl:  .  metoprolol succinate (TOPROL-XL) 25 MG 24 hr tablet, Take 1 tablet (25 mg total) by mouth in the morning and at bedtime., Disp: 60 tablet, Rfl: 6 .  mirtazapine (REMERON) 15 MG tablet, Take 15 mg by mouth at bedtime., Disp: , Rfl:  .  Multiple Vitamins-Minerals (CENTRUM ADULTS) TABS, Take 1 tablet by mouth daily., Disp: , Rfl:  .  niacin (NIASPAN) 500 MG CR tablet, Take 1 tablet by mouth daily., Disp: , Rfl:  .  omeprazole (PRILOSEC) 20 MG capsule, Take 1 capsule (20 mg total) by mouth daily., Disp: 90 capsule, Rfl: 3 .  HYDROcodone-acetaminophen (NORCO) 10-325 MG tablet, Take 1 tablet by mouth as needed for pain. (Patient not taking: Reported on 06/04/2020), Disp: , Rfl:  .  zolpidem (AMBIEN) 5 MG tablet, Take 1 tablet (5 mg total) by mouth at bedtime as needed for sleep., Disp: 10 tablet, Rfl: 0   Family History: family history includes Diabetes in her mother; Heart failure in her father and maternal aunt.    Social History:   reports that she has been smoking cigarettes. She has a 12.50 pack-year smoking history. She has never used smokeless tobacco. She reports that she does not drink alcohol and does not use drugs.   Review of Systems: Constitutional: + weight loss/weight gain  Eyes: No changes in vision. ENT: No oral lesions, sore throat.  GI: see HPI.  Heme/Lymph: No easy bruising.    CV: No chest pain.  GU: No hematuria.  Integumentary: No rashes.  Neuro: No headaches.  Psych: No depression/anxiety.  Endocrine: No heat/cold intolerance.  Allergic/Immunologic: No urticaria.  Resp: No cough, SOB.  Musculoskeletal: No joint swelling.    Physical Examination: BP (!) 163/66 (BP Location: Left Arm, Patient Position: Sitting, Cuff Size: Normal)   Temp (!) 97.1 F (36.2 C) (Temporal)   Ht 5' 3"  (1.6 m)  Wt 152 lb 4.8 oz (69.1 kg)   BMI 26.98 kg/m  Gen: NAD, alert and oriented x 4 HEENT: PEERLA, EOMI, Neck: supple, no JVD Chest: CTA bilaterally, no wheezes, crackles, or other adventitious sounds CV: 3/6 murmur, RRR Abd: soft, NT, ND, +BS in all four quadrants; no HSM, guarding, ridigity, or rebound tenderness Ext: no edema, well perfused with 2+ pulses, Skin: no rash or lesions noted on observed skin Lymph: no noted LAD  Data Reviewed:   Barium swallow 04/18/20-  IMPRESSION: 1. Nonspecific esophageal dysmotility disorder. 2. Fold thickening in the distal esophagus thought to favor esophagitis. 3. Varicoid transient thickening in the esophageal wall compatiblewith uphill esophageal varices. 4. Minimal flash laryngeal penetration was observed. No frank tracheal aspiration. 5. There was some sluggish drainage of the contrast medium through the gastroesophageal junction, as well as impaction of a 13 mm barium tablet in this vicinity, but I do not see a true stricture in this vicinity, probably attributable to angulation at the GE junction.  03/2020--- hemoglobin 11.1, platelets 120. May 2021-BMP normal   CMP April 2021-sodium 136, potassium 3.4, calcium 8.8, AST 48.  GFR 59.   IMPRESSION: [place] 2019 Gallbladder sludge.  No sonographic evidence of acute cholecystitis. Top-normal common bile duct diameter where visualized. Heterogeneously increased hepatic echotexture with surface contour nodularity consistent with known cirrhosis  Assessment/Plan: Ms.  Mcnulty is a 79 y.o. female    Shaday was seen today for dysphagia.  Diagnoses and all orders for this visit:  Abnormal barium swallow -     CBC with Differential  Cirrhosis of liver without ascites, unspecified hepatic cirrhosis type (HCC) -     COMPLETE METABOLIC PANEL WITH GFR -     AFP tumor marker -     INR/PT -     Ammonia -     US Abdomen Complete; Future -     CBC with Differential  Long term current use of anticoagulant therapy -     CBC with Differential  Other orders -     COMPLETE METABOLIC PANEL WITH GFR -     Protime-INR -     AFP tumor marker -     Ammonia     1. Dysphagia/abnormal barium swallow-symptomatically improved with crushing her pills in interim.  Needs upper endoscopy for evaluation, last 2015. She denies liquid or solid food dysphagia.   2.  Cirrhosis-reports history of [place].  She denies any alcohol use.  She has thrombocytopenia.  Her last LFTs showed a normal with albumin.  We will repeat her CMP, INR, CBC, AFP, etc. She is overdue for surviellance imaging-also schedule today. Varices seen on barium swallow-EGD as above. No edema or ascites on exam.    Last colonoscopy over 10 years ago but given absence of lower GI symptoms she prefers to hold off on colonoscopy which is reasonable.    *On eliquis for hx of CVA - will verify w/ Dr.Fusco can hold prior to EGD     Patient denies CP, SOB, and use of blood thinners. I discussed the risks and benefits of procedure including bleeding, perforation, infection, missed lesions, medication reactions and possible hospitalization or surgery if complications. All questions answered.    I personally performed the service, non-incident to. (WP)  Laurine Blazer, [facility]

## 2020-06-04 NOTE — H&P (View-Only) (Signed)
Patient profile: [name] is a 79 y.o. female seen for evaluation of abnormal barium swallow.  History of Present Illness: [name] is seen today for follow-up.  She is accompanied by her granddaughter who she lives with and helps with history.  She reports several months of dysphagia symptoms.  The symptoms were evaluated in August 2021 with a barium swallow as below.  She notes mainly dysphagia to pills - Lasix pill was made the most problematic.  She has started crushing her medications and she is putting in Ensure which has significantly helped w/ pill dysphagia.  These seemed to lodged in the throat/upper esophagus prior to crushing.  She denies any food or liquid dysphagia.  She does not feel any GERD symptoms on low-dose omeprazole.  She is not having any nausea or vomiting.   She reports previously having constipation but has started stool softener twice a day.  Now has occasional loose stools.  Denies any blood in stool, melena, rectal bleeding or abdominal pain. Overall bowels seem stable.   She has started her on Remeron and is gaining weight recently.  Wt Readings from Last 3 Encounters:  06/04/20 152 lb 4.8 oz (69.1 kg)  03/09/20 145 lb 3.2 oz (65.9 kg)  02/24/20 138 lb 9.6 oz (62.9 kg)     Last Colonoscopy: Per patient many years ago Last Endoscopy: 2015-Impression: Two columns of esophageal varices were banded. Portal gastropathy. 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     Past Medical History:  Past Medical History:  Diagnosis Date  . Anemia   . Aortic insufficiency    Moderate  . Back pain   . Carotid stenosis, bilateral   . Cirrhosis ([place])   . Closed left femoral fracture ([place]) 04/21/2017  . Coronary artery disease    Stent x 2 RCA 1995, cardiac catheterization 09/2016 showing only mild atherosclerosis  . DDD (degenerative disc disease), lumbar   . Essential hypertension   . Falls   . GERD (gastroesophageal reflux disease)     . Hiatal hernia   . History of stroke   . Hypercholesteremia   . IBS (irritable bowel syndrome)   . Non-alcoholic fatty liver disease   . OSA (obstructive sleep apnea)    no CPAP  . Pericardial effusion    a. small by echo 2018.  Marland Kitchen PSVT (paroxysmal supraventricular tachycardia) ([place])   . Recurrent UTI   . Seizures ([place])   . Stroke ([place])   . Type 2 diabetes mellitus ([place])    off medication  . Varices, esophageal ([place])     Problem List: Patient Active Problem List   Diagnosis Date Noted  . Persistent atrial fibrillation ([place]) 03/09/2020  . Paroxysmal atrial fibrillation ([place]) 02/24/2020  . Secondary hypercoagulable state ([place]) 02/24/2020  . Acute CVA (cerebrovascular accident) ([place]) 01/03/2020  . Tobacco use disorder 07/09/2018  . Small vessel disease ([place]) 07/09/2018  . Benign essential HTN   . Seizures ([place])   . Tachypnea   . Dysphagia, post-stroke   . Dysarthria 05/19/2018  . Syncope and collapse   . Syncope 08/24/2017  . Closed left femoral fracture ([place]) 04/21/2017  . Seizure disorder ([place]) 04/21/2017  . Closed pertrochanteric fracture of femur, left, initial encounter ([place]) 03/02/2017  . Seizure ([place]) 02/28/2017  . Hip fracture ([place]) 02/28/2017  . Diabetes mellitus without complication ([place]) 40/97/3532  . Epiretinal membrane (ERM) of left eye 01/20/2017  . Pseudophakia 01/20/2017  . Chest pain 12/07/2016  . Atypical chest pain  12/07/2016  . Tachycardia   . NSTEMI (non-ST elevated myocardial infarction) ([place]) 11/02/2016  . Multifocal atrial tachycardia ([place]) 09/25/2016  . Elevated troponin 09/24/2016  . Hypokalemia 09/23/2016  . TIA (transient ischemic attack) 05/05/2016  . Intractable nausea and vomiting 10/15/2015  . Acute coronary syndrome ([place]) 06/01/2015  . Bronchitis 06/01/2015  . Thrombocytopenia ([place]) 06/01/2015  . Cerebral infarction ([place]) right occipital due to small vessel disease s/p tPA 03/06/2014  . Lower urinary tract infectious disease   .  PSVT (paroxysmal supraventricular tachycardia) ([place]) 12/09/2013  . Esophageal varices ([place]) 03/29/2013  . Hematemesis 03/29/2013  . Hepatic cirrhosis ([place]) 03/29/2013  . Presence of stent in right coronary artery 07/04/2011  . OSA (obstructive sleep apnea) 07/04/2011  . Aortic sclerosis 07/04/2011  . Aortic insufficiency 07/04/2011  . HTN (hypertension) 07/04/2011  . Carotid stenosis, bilateral 07/04/2011  . Chest pain at rest 07/03/2011  . CAD (coronary artery disease) 07/03/2011  . Hypercholesteremia 07/03/2011  . GERD (gastroesophageal reflux disease) 07/03/2011    Past Surgical History: Past Surgical History:  Procedure Laterality Date  . [place]  . APPENDECTOMY    . BACK SURGERY  1985  . CARDIAC CATHETERIZATION  [phone]   Stent to the proximal RCA after MI   . CARDIAC CATHETERIZATION N/A 09/26/2016   Procedure: Left Heart Cath and Coronary Angiography;  Surgeon: Leonie Man, MD;  Location: [facility];  Service: Cardiovascular;  Laterality: N/A;  . CATARACT EXTRACTION W/PHACO Right 07/04/2014   Procedure: CATARACT EXTRACTION PHACO AND INTRAOCULAR LENS PLACEMENT (IOC);  Surgeon: Elta Guadeloupe T. Gershon Crane, MD;  Location: [facility];  Service: Ophthalmology;  Laterality: Right;  CDE:13.13  . COLONOSCOPY    . DILATION AND CURETTAGE OF UTERUS     x2  . Epi Retinal Membrane Peel Left   . ERCP    . ESOPHAGEAL BANDING N/A 04/01/2013   Procedure: ESOPHAGEAL BANDING;  Surgeon: Rogene Houston, MD;  Location: [facility];  Service: Endoscopy;  Laterality: N/A;  . ESOPHAGEAL BANDING N/A 05/24/2013   Procedure: ESOPHAGEAL BANDING;  Surgeon: Rogene Houston, MD;  [facility];  Service: Endoscopy;  Laterality: N/A;  . ESOPHAGEAL BANDING N/A 06/21/2014   Procedure: ESOPHAGEAL BANDING;  Surgeon: Rogene Houston, MD;  [facility];  Service: Endoscopy;  Laterality: N/A;  . ESOPHAGOGASTRODUODENOSCOPY N/A 04/01/2013   Procedure:  ESOPHAGOGASTRODUODENOSCOPY (EGD);  Surgeon: Rogene Houston, MD;  [facility];  Service: Endoscopy;  Laterality: N/A;  230-rescheduled to 8:30am Ann notified pt  . ESOPHAGOGASTRODUODENOSCOPY N/A 05/24/2013   Procedure: ESOPHAGOGASTRODUODENOSCOPY (EGD);  Surgeon: Rogene Houston, MD;  [facility];  Service: Endoscopy;  Laterality: N/A;  730  . ESOPHAGOGASTRODUODENOSCOPY N/A 06/21/2014   Procedure: ESOPHAGOGASTRODUODENOSCOPY (EGD);  Surgeon: Rogene Houston, MD;  [facility];  Service: Endoscopy;  Laterality: N/A;  930-rescheduled 10/14 @ 1200 Ann to notify pt  . EYE SURGERY  08   cataract surgery of the left eye  . FEMUR IM NAIL Left 03/02/2017   Procedure: INTRAMEDULLARY (IM) NAIL FEMORAL;  Surgeon: Rod Can, MD;  Location: [facility];  Service: Orthopedics;  Laterality: Left;  . HARDWARE REMOVAL Right 01/17/2013   Procedure: REMOVAL OF HARDWARE AND EXCISION ULNAR STYLOID RIGHT WRIST;  Surgeon: Tennis Must, MD;  Location: [facility];  Service: Orthopedics;  Laterality: Right;  . MYRINGOTOMY  2012   both ears  . ORIF FEMUR FRACTURE Left 04/23/2017   Procedure: OPEN  REDUCTION INTERNAL FIXATION (ORIF) DISTAL FEMUR FRACTURE (FRACTURE AROUND FEMORAL NAIL);  Surgeon: Altamese , MD;  Location: [facility];  Service: Orthopedics;  Laterality: Left;  . TONSILLECTOMY    . VAGINAL HYSTERECTOMY  1972  . WRIST SURGERY     rt wrist hardwear removal    Allergies: Allergies  Allergen Reactions  . Tape Rash and Other (See Comments)    Please use paper tape      Home Medications:  Current Outpatient Medications:  .  Acetaminophen 160 MG PACK, Take 500 mg by mouth daily., Disp: , Rfl:  .  apixaban (ELIQUIS) 5 MG TABS tablet, Take 1 tablet (5 mg total) by mouth 2 (two) times daily., Disp: 60 tablet, Rfl: 3 .  atorvastatin (LIPITOR) 80 MG tablet, Take 1 tablet (80 mg total) by mouth daily., Disp: 30 tablet, Rfl: 1 .  calcium citrate (CALCITRATE - DOSED  IN MG ELEMENTAL CALCIUM) 950 MG tablet, Take 1 tablet (200 mg of elemental calcium total) by mouth daily., Disp: 30 tablet, Rfl: 1 .  Coenzyme Q10 10 MG capsule, Take 10 mg by mouth daily., Disp: , Rfl:  .  diltiazem (CARDIZEM CD) 120 MG 24 hr capsule, Take 1 capsule (120 mg total) by mouth daily., Disp: 30 capsule, Rfl: 2 .  docusate sodium (COLACE) 100 MG capsule, Take 100 mg by mouth 2 (two) times daily. , Disp: , Rfl:  .  fluticasone (FLONASE) 50 MCG/ACT nasal spray, Place 2 sprays into both nostrils daily., Disp: , Rfl:  .  furosemide (LASIX) 20 MG tablet, Take 20 mg by mouth daily. , Disp: , Rfl:  .  metoprolol succinate (TOPROL-XL) 25 MG 24 hr tablet, Take 1 tablet (25 mg total) by mouth in the morning and at bedtime., Disp: 60 tablet, Rfl: 6 .  mirtazapine (REMERON) 15 MG tablet, Take 15 mg by mouth at bedtime., Disp: , Rfl:  .  Multiple Vitamins-Minerals (CENTRUM ADULTS) TABS, Take 1 tablet by mouth daily., Disp: , Rfl:  .  niacin (NIASPAN) 500 MG CR tablet, Take 1 tablet by mouth daily., Disp: , Rfl:  .  omeprazole (PRILOSEC) 20 MG capsule, Take 1 capsule (20 mg total) by mouth daily., Disp: 90 capsule, Rfl: 3 .  HYDROcodone-acetaminophen (NORCO) 10-325 MG tablet, Take 1 tablet by mouth as needed for pain. (Patient not taking: Reported on 06/04/2020), Disp: , Rfl:  .  zolpidem (AMBIEN) 5 MG tablet, Take 1 tablet (5 mg total) by mouth at bedtime as needed for sleep., Disp: 10 tablet, Rfl: 0   Family History: family history includes Diabetes in her mother; Heart failure in her father and maternal aunt.    Social History:   reports that she has been smoking cigarettes. She has a 12.50 pack-year smoking history. She has never used smokeless tobacco. She reports that she does not drink alcohol and does not use drugs.   Review of Systems: Constitutional: + weight loss/weight gain  Eyes: No changes in vision. ENT: No oral lesions, sore throat.  GI: see HPI.  Heme/Lymph: No easy bruising.    CV: No chest pain.  GU: No hematuria.  Integumentary: No rashes.  Neuro: No headaches.  Psych: No depression/anxiety.  Endocrine: No heat/cold intolerance.  Allergic/Immunologic: No urticaria.  Resp: No cough, SOB.  Musculoskeletal: No joint swelling.    Physical Examination: BP (!) 163/66 (BP Location: Left Arm, Patient Position: Sitting, Cuff Size: Normal)   Temp (!) 97.1 F (36.2 C) (Temporal)   Ht 5' 3"  (1.6 m)  Wt 152 lb 4.8 oz (69.1 kg)   BMI 26.98 kg/m  Gen: NAD, alert and oriented x 4 HEENT: PEERLA, EOMI, Neck: supple, no JVD Chest: CTA bilaterally, no wheezes, crackles, or other adventitious sounds CV: 3/6 murmur, RRR Abd: soft, NT, ND, +BS in all four quadrants; no HSM, guarding, ridigity, or rebound tenderness Ext: no edema, well perfused with 2+ pulses, Skin: no rash or lesions noted on observed skin Lymph: no noted LAD  Data Reviewed:   Barium swallow 04/18/20-  IMPRESSION: 1. Nonspecific esophageal dysmotility disorder. 2. Fold thickening in the distal esophagus thought to favor esophagitis. 3. Varicoid transient thickening in the esophageal wall compatiblewith uphill esophageal varices. 4. Minimal flash laryngeal penetration was observed. No frank tracheal aspiration. 5. There was some sluggish drainage of the contrast medium through the gastroesophageal junction, as well as impaction of a 13 mm barium tablet in this vicinity, but I do not see a true stricture in this vicinity, probably attributable to angulation at the GE junction.  03/2020--- hemoglobin 11.1, platelets 120. May 2021-BMP normal   CMP April 2021-sodium 136, potassium 3.4, calcium 8.8, AST 48.  GFR 59.   IMPRESSION: [place] 2019 Gallbladder sludge.  No sonographic evidence of acute cholecystitis. Top-normal common bile duct diameter where visualized. Heterogeneously increased hepatic echotexture with surface contour nodularity consistent with known cirrhosis  Assessment/Plan: Ms.  Deupree is a 79 y.o. female    Katheren was seen today for dysphagia.  Diagnoses and all orders for this visit:  Abnormal barium swallow -     CBC with Differential  Cirrhosis of liver without ascites, unspecified hepatic cirrhosis type (HCC) -     COMPLETE METABOLIC PANEL WITH GFR -     AFP tumor marker -     INR/PT -     Ammonia -     US Abdomen Complete; Future -     CBC with Differential  Long term current use of anticoagulant therapy -     CBC with Differential  Other orders -     COMPLETE METABOLIC PANEL WITH GFR -     Protime-INR -     AFP tumor marker -     Ammonia     1. Dysphagia/abnormal barium swallow-symptomatically improved with crushing her pills in interim.  Needs upper endoscopy for evaluation, last 2015. She denies liquid or solid food dysphagia.   2.  Cirrhosis-reports history of [place].  She denies any alcohol use.  She has thrombocytopenia.  Her last LFTs showed a normal with albumin.  We will repeat her CMP, INR, CBC, AFP, etc. She is overdue for surviellance imaging-also schedule today. Varices seen on barium swallow-EGD as above. No edema or ascites on exam.    Last colonoscopy over 10 years ago but given absence of lower GI symptoms she prefers to hold off on colonoscopy which is reasonable.    *On eliquis for hx of CVA - will verify w/ Dr.Fusco can hold prior to EGD     Patient denies CP, SOB, and use of blood thinners. I discussed the risks and benefits of procedure including bleeding, perforation, infection, missed lesions, medication reactions and possible hospitalization or surgery if complications. All questions answered.    I personally performed the service, non-incident to. (WP)  Laurine Blazer, [facility]

## 2020-06-04 NOTE — Patient Instructions (Signed)
We are checking labs and ultrasound. We are scheduling endoscopy for evaluation. Please call w/ any questions.

## 2020-06-05 ENCOUNTER — Other Ambulatory Visit (INDEPENDENT_AMBULATORY_CARE_PROVIDER_SITE_OTHER): Payer: Self-pay | Admitting: *Deleted

## 2020-06-05 DIAGNOSIS — K7581 Nonalcoholic steatohepatitis (NASH): Secondary | ICD-10-CM | POA: Insufficient documentation

## 2020-06-05 DIAGNOSIS — Z8719 Personal history of other diseases of the digestive system: Secondary | ICD-10-CM | POA: Insufficient documentation

## 2020-06-05 LAB — CBC WITH DIFFERENTIAL/PLATELET
Absolute Monocytes: 659 cells/uL (ref 200–950)
Basophils Absolute: 7 cells/uL (ref 0–200)
Basophils Relative: 0.1 %
Eosinophils Absolute: 444 cells/uL (ref 15–500)
Eosinophils Relative: 6 %
HCT: 36.9 % (ref 35.0–45.0)
Hemoglobin: 11.9 g/dL (ref 11.7–15.5)
Lymphs Abs: 1473 cells/uL (ref 850–3900)
MCH: 29.6 pg (ref 27.0–33.0)
MCHC: 32.2 g/dL (ref 32.0–36.0)
MCV: 91.8 fL (ref 80.0–100.0)
MPV: 11 fL (ref 7.5–12.5)
Monocytes Relative: 8.9 %
Neutro Abs: 4817 cells/uL (ref 1500–7800)
Neutrophils Relative %: 65.1 %
Platelets: 158 10*3/uL (ref 140–400)
RBC: 4.02 10*6/uL (ref 3.80–5.10)
RDW: 13.1 % (ref 11.0–15.0)
Total Lymphocyte: 19.9 %
WBC: 7.4 10*3/uL (ref 3.8–10.8)

## 2020-06-05 LAB — COMPLETE METABOLIC PANEL WITH GFR
AG Ratio: 1.6 (calc) (ref 1.0–2.5)
ALT: 13 U/L (ref 6–29)
AST: 29 U/L (ref 10–35)
Albumin: 3.9 g/dL (ref 3.6–5.1)
Alkaline phosphatase (APISO): 116 U/L (ref 37–153)
BUN: 22 mg/dL (ref 7–25)
CO2: 28 mmol/L (ref 20–32)
Calcium: 9.4 mg/dL (ref 8.6–10.4)
Chloride: 103 mmol/L (ref 98–110)
Creat: 0.88 mg/dL (ref 0.60–0.93)
GFR, Est African American: 73 mL/min/{1.73_m2} (ref >=60)
GFR, Est Non African American: 63 mL/min/{1.73_m2} (ref >=60)
Globulin: 2.5 g/dL (calc) (ref 1.9–3.7)
Glucose, Bld: 112 mg/dL (ref 65–139)
Potassium: 3.1 mmol/L — ABNORMAL LOW (ref 3.5–5.3)
Sodium: 143 mmol/L (ref 135–146)
Total Bilirubin: 0.9 mg/dL (ref 0.2–1.2)
Total Protein: 6.4 g/dL (ref 6.1–8.1)

## 2020-06-05 LAB — AFP TUMOR MARKER: AFP-Tumor Marker: 35.6 ng/mL — ABNORMAL HIGH

## 2020-06-05 LAB — PROTIME-INR
INR: 1.1
Prothrombin Time: 11.9 s — ABNORMAL HIGH (ref 9.0–11.5)

## 2020-06-05 LAB — AMMONIA: Ammonia: 60 umol/L (ref <=72)

## 2020-06-06 ENCOUNTER — Other Ambulatory Visit: Payer: Self-pay

## 2020-06-06 ENCOUNTER — Ambulatory Visit (HOSPITAL_COMMUNITY)
Admission: RE | Admit: 2020-06-06 | Discharge: 2020-06-06 | Disposition: A | Discharge status: Home / Self Care | Payer: Medicare Other | Source: Ambulatory Visit | Attending: Gastroenterology | Admitting: Gastroenterology

## 2020-06-06 DIAGNOSIS — K7460 Unspecified cirrhosis of liver: Secondary | ICD-10-CM | POA: Diagnosis not present

## 2020-06-06 DIAGNOSIS — I700 Atherosclerosis of aorta: Secondary | ICD-10-CM | POA: Diagnosis not present

## 2020-06-06 DIAGNOSIS — K7689 Other specified diseases of liver: Secondary | ICD-10-CM | POA: Diagnosis not present

## 2020-06-07 ENCOUNTER — Other Ambulatory Visit (INDEPENDENT_AMBULATORY_CARE_PROVIDER_SITE_OTHER): Payer: Self-pay | Admitting: Gastroenterology

## 2020-06-07 ENCOUNTER — Encounter: Payer: Self-pay | Admitting: *Deleted

## 2020-06-07 DIAGNOSIS — E7849 Other hyperlipidemia: Secondary | ICD-10-CM | POA: Diagnosis not present

## 2020-06-07 DIAGNOSIS — I10 Essential (primary) hypertension: Secondary | ICD-10-CM | POA: Diagnosis not present

## 2020-06-07 MED ORDER — POTASSIUM CHLORIDE ER 10 MEQ PO TBCR: 20.0000 meq | EXTENDED_RELEASE_TABLET | Freq: Every day | ORAL | 0 refills | Status: DC

## 2020-06-08 DIAGNOSIS — L851 Acquired keratosis [keratoderma] palmaris et plantaris: Secondary | ICD-10-CM | POA: Diagnosis not present

## 2020-06-08 DIAGNOSIS — B351 Tinea unguium: Secondary | ICD-10-CM | POA: Diagnosis not present

## 2020-06-08 DIAGNOSIS — E1142 Type 2 diabetes mellitus with diabetic polyneuropathy: Secondary | ICD-10-CM | POA: Diagnosis not present

## 2020-06-11 ENCOUNTER — Encounter (INDEPENDENT_AMBULATORY_CARE_PROVIDER_SITE_OTHER): Payer: Self-pay | Admitting: *Deleted

## 2020-06-11 ENCOUNTER — Other Ambulatory Visit: Payer: Self-pay

## 2020-06-11 ENCOUNTER — Telehealth (INDEPENDENT_AMBULATORY_CARE_PROVIDER_SITE_OTHER): Payer: Self-pay | Admitting: *Deleted

## 2020-06-11 ENCOUNTER — Other Ambulatory Visit: Payer: Self-pay | Admitting: *Deleted

## 2020-06-11 ENCOUNTER — Ambulatory Visit (INDEPENDENT_AMBULATORY_CARE_PROVIDER_SITE_OTHER): Payer: Medicare Other | Admitting: Cardiology

## 2020-06-11 ENCOUNTER — Encounter: Payer: Self-pay | Admitting: Cardiology

## 2020-06-11 VITALS — BP 150/60 | HR 83 | Ht 63.0 in | Wt 156.0 lb

## 2020-06-11 DIAGNOSIS — I4891 Unspecified atrial fibrillation: Secondary | ICD-10-CM | POA: Diagnosis not present

## 2020-06-11 DIAGNOSIS — I350 Nonrheumatic aortic (valve) stenosis: Secondary | ICD-10-CM

## 2020-06-11 DIAGNOSIS — I6523 Occlusion and stenosis of bilateral carotid arteries: Secondary | ICD-10-CM | POA: Diagnosis not present

## 2020-06-11 DIAGNOSIS — I2510 Atherosclerotic heart disease of native coronary artery without angina pectoris: Secondary | ICD-10-CM

## 2020-06-11 MED ORDER — FUROSEMIDE 40 MG PO TABS: 40.0000 mg | ORAL_TABLET | Freq: Every day | ORAL | 3 refills | Status: DC

## 2020-06-11 NOTE — Patient Outreach (Addendum)
[facility]) Care Management  06/11/2020  [name] Mar 17, 1941 [id number]  Unsuccessful outreach attempt made to patient. Patient answered the phone and stated she was not available to speak today. Plan: RN Health Coach will call patient within the month of November.  Barbara Loron RN, BSN [place] [phone] [name]

## 2020-06-11 NOTE — Telephone Encounter (Signed)
Patient's grand daughter  [name]) called the office stating that her grand mother could not take the potassium. It was to big and she could not swallow it.  This was discussed with Laurine Blazer PA. She ask that I call the pharmacy and ask them what would be the equivalent to 20 mEq. Patient will need to take every day for 2 weeks.  Called [facility] - he states that he can do this and that it would be tomorrow before he had it. He will call the patient when it is ready.  [name] was called and made aware. [phone].

## 2020-06-11 NOTE — Telephone Encounter (Signed)
Thanks Lelon Frohlich

## 2020-06-11 NOTE — Telephone Encounter (Signed)
Per Dr Gerarda Fraction patient can stop Eliquis 2 days before procedure - patient aware

## 2020-06-11 NOTE — Telephone Encounter (Signed)
This encounter was created in error - please disregard.

## 2020-06-11 NOTE — Patient Instructions (Signed)
Medication Instructions:  Take Lasix 40 mg daily. You make take an  Additional 20 mg for severe swelling.  *If you need a refill on your cardiac medications before your next appointment, please call your pharmacy*   Lab Work: Fasting lipids  If you have labs (blood work) drawn today and your tests are completely normal, you will receive your results only by: Marland Kitchen MyChart Message (if you have MyChart) OR . A paper copy in the mail If you have any lab test that is abnormal or we need to change your treatment, we will call you to review the results.   Testing/Procedures: None today   Follow-Up: At [facility], you and your health needs are our priority.  As part of our continuing mission to provide you with exceptional heart care, we have created designated Provider Care Teams.  These Care Teams include your primary Cardiologist (physician) and Advanced Practice Providers (APPs -  Physician Assistants and Nurse Practitioners) who all work together to provide you with the care you need, when you need it.  Your next appointment:   6 month(s)  The format for your next appointment:   In Person  Provider:   Carlyle Dolly, MD   Other Instructions None   Thank you for choosing [facility] !

## 2020-06-11 NOTE — Progress Notes (Signed)
Clinical Summary Ms. Pain is a 79 y.o.female seen today for follow up of the following medical problems.   1. PAF/Prior history of PSVT - history of cryptogenic stroke, loop recorder placed and detected afib - seen in afib clinic.  - PAF, she is on diltiazem and toprol. Eliquis for stroke prevention.  - no bleeding eliquis    2. CAD - history of MI in 1995, received 2 stents to RCA - cath 2007 with patent coronaries.  - cath Jan 2018 nonobstructive disease - echo 10/2016 LVEF 65-70%, no WMAs  - admit 10/2016 with elevated troponin to 1.33, Thought to be demand ischemia related to her SVT at the time - admit 12/2016 with chest pain. Mild troponin 0.32 at that time. Managed medically due to recent normal cath and stable echo  - no recent chest pains.    3. 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 Aortic valve disease - 06/2018 moderate to severe AI, LVEF 65-70%, no LVIDs reported. Does not mention aortic stenosis however mean grad 27 but no AVA VTI given. From 04/2018 study had moderate AS with mean grad 21 and AVA VTI 1.06  12/2019 echo mean grad 19, AVA VTI 1.14 - no symptoms   6. Mitral stenosis -12/2019 echo mean grad 5  7. History of CVA - admittion 07/2018, 12/2019 - has loop recorder for history of cryptogenic stroke.   8. Hyperlipidemia 06/2018 TC 109 TG 50 HDL 49 LDL 50 12/2019 TC 162 HDL 63 TG 78 LDL 83 -has been on atorvastatin 25m long term     Has had covid vaccine, moderna.  Past Medical History:  Diagnosis Date  . Anemia   . Aortic insufficiency    Moderate  . Back pain   . Carotid stenosis, bilateral   . Cirrhosis ([place]   . Closed left femoral fracture ([place] 04/21/2017  . Coronary artery disease    Stent x 2 RCA 1995, cardiac catheterization 09/2016 showing only mild atherosclerosis  . DDD  (degenerative disc disease), lumbar   . Essential hypertension   . Falls   . GERD (gastroesophageal reflux disease)   . Hiatal hernia   . History of stroke   . Hypercholesteremia   . IBS (irritable bowel syndrome)   . Non-alcoholic fatty liver disease   . OSA (obstructive sleep apnea)    no CPAP  . Pericardial effusion    a. small by echo 2018.  .Marland KitchenPSVT (paroxysmal supraventricular tachycardia) ([place]   . Recurrent UTI   . Seizures ([facility]   . Stroke ([facility]   . Swallowing difficulty   . Type 2 diabetes mellitus ([facility]    off medication  . Varices, esophageal (HCC)      Allergies  Allergen Reactions  . Tape Rash and Other (See Comments)    Please use paper tape     Current Outpatient Medications  Medication Sig Dispense Refill  . acetaminophen (TYLENOL) 500 MG tablet Take 1,000 mg by mouth every 6 (six) hours as needed for moderate pain.     .Marland Kitchenapixaban (ELIQUIS) 5 MG TABS tablet Take 1 tablet (5 mg total) by mouth 2 (two) times daily. 60 tablet 3  . atorvastatin (LIPITOR) 80 MG tablet Take 1 tablet (80 mg total) by mouth daily. 30 tablet 1  . calcium citrate (CALCITRATE - DOSED IN MG ELEMENTAL CALCIUM) 950 MG  tablet Take 1 tablet (200 mg of elemental calcium total) by mouth daily. (Patient taking differently: Take 0.5 tablets by mouth daily. ) 30 tablet 1  . Coenzyme Q10 100 MG capsule Take 100 mg by mouth daily.     Marland Kitchen diltiazem (CARDIZEM CD) 120 MG 24 hr capsule Take 1 capsule (120 mg total) by mouth daily. 30 capsule 2  . docusate sodium (COLACE) 100 MG capsule Take 100 mg by mouth 2 (two) times daily.     . fluticasone (FLONASE) 50 MCG/ACT nasal spray Place 2 sprays into both nostrils daily.    . furosemide (LASIX) 20 MG tablet Take 20 mg by mouth daily.     Marland Kitchen HYDROcodone-acetaminophen (NORCO) 10-325 MG tablet Take 1 tablet by mouth as needed for pain. (Patient not taking: Reported on 06/04/2020)    . metoprolol succinate (TOPROL-XL) 25 MG 24 hr tablet Take 1 tablet (25 mg  total) by mouth in the morning and at bedtime. 60 tablet 6  . mirtazapine (REMERON) 30 MG tablet Take 30 mg by mouth at bedtime.     . Multiple Vitamins-Minerals (CENTRUM ADULTS) TABS Take 1 tablet by mouth daily. (Patient not taking: Reported on 06/05/2020)    . Multiple Vitamins-Minerals (MULTIVITAMIN GUMMIES ADULT PO) Take 2 tablets by mouth daily.    . niacin (NIASPAN) 500 MG CR tablet Take 500 mg by mouth daily.     Marland Kitchen omeprazole (PRILOSEC) 20 MG capsule Take 1 capsule (20 mg total) by mouth daily. 90 capsule 3  . ondansetron (ZOFRAN) 4 MG tablet Take 4 mg by mouth every 8 (eight) hours as needed for nausea or vomiting.    . potassium chloride (KLOR-CON) 10 MEQ tablet Take 2 tablets (20 mEq total) by mouth daily. 20 tablet 0  . Propylene Glycol (SYSTANE COMPLETE OP) Place 1 drop into both eyes in the morning and at bedtime.    Marland Kitchen zolpidem (AMBIEN) 5 MG tablet Take 1 tablet (5 mg total) by mouth at bedtime as needed for sleep. (Patient not taking: Reported on 06/05/2020) 10 tablet 0   No current facility-administered medications for this visit.     Past Surgical History:  Procedure Laterality Date  . [place]  . APPENDECTOMY    . BACK SURGERY  1985  . CARDIAC CATHETERIZATION  [phone]   Stent to the proximal RCA after MI   . CARDIAC CATHETERIZATION N/A 09/26/2016   Procedure: Left Heart Cath and Coronary Angiography;  Surgeon: Leonie Man, MD;  Location: [facility];  Service: Cardiovascular;  Laterality: N/A;  . CATARACT EXTRACTION W/PHACO Right 07/04/2014   Procedure: CATARACT EXTRACTION PHACO AND INTRAOCULAR LENS PLACEMENT (IOC);  Surgeon: Elta Guadeloupe T. Gershon Crane, MD;  Location: [facility];  Service: Ophthalmology;  Laterality: Right;  CDE:13.13  . COLONOSCOPY    . DILATION AND CURETTAGE OF UTERUS     x2  . Epi Retinal Membrane Peel Left   . ERCP    . ESOPHAGEAL BANDING N/A 04/01/2013   Procedure: ESOPHAGEAL BANDING;  Surgeon: Rogene Houston, MD;  [facility]: [facility];  Service: Endoscopy;  Laterality: N/A;  . ESOPHAGEAL BANDING N/A 05/24/2013   Procedure: ESOPHAGEAL BANDING;  Surgeon: Rogene Houston, MD;  [facility];  Service: Endoscopy;  Laterality: N/A;  . ESOPHAGEAL BANDING N/A 06/21/2014   Procedure: ESOPHAGEAL BANDING;  Surgeon: Rogene Houston, MD;  [facility];  Service: Endoscopy;  Laterality: N/A;  . ESOPHAGOGASTRODUODENOSCOPY N/A 04/01/2013   Procedure:  ESOPHAGOGASTRODUODENOSCOPY (EGD);  Surgeon: Rogene Houston, MD;  [facility];  Service: Endoscopy;  Laterality: N/A;  230-rescheduled to 8:30am Ann notified pt  . ESOPHAGOGASTRODUODENOSCOPY N/A 05/24/2013   Procedure: ESOPHAGOGASTRODUODENOSCOPY (EGD);  Surgeon: Rogene Houston, MD;  [facility];  Service: Endoscopy;  Laterality: N/A;  730  . ESOPHAGOGASTRODUODENOSCOPY N/A 06/21/2014   Procedure: ESOPHAGOGASTRODUODENOSCOPY (EGD);  Surgeon: Rogene Houston, MD;  [facility];  Service: Endoscopy;  Laterality: N/A;  930-rescheduled 10/14 @ 1200 Ann to notify pt  . EYE SURGERY  08   cataract surgery of the left eye  . FEMUR IM NAIL Left 03/02/2017   Procedure: INTRAMEDULLARY (IM) NAIL FEMORAL;  Surgeon: Rod Can, MD;  Location: [facility];  Service: Orthopedics;  Laterality: Left;  . HARDWARE REMOVAL Right 01/17/2013   Procedure: REMOVAL OF HARDWARE AND EXCISION ULNAR STYLOID RIGHT WRIST;  Surgeon: Tennis Must, MD;  Location: [facility];  Service: Orthopedics;  Laterality: Right;  . MYRINGOTOMY  2012   both ears  . ORIF FEMUR FRACTURE Left 04/23/2017   Procedure: OPEN REDUCTION INTERNAL FIXATION (ORIF) DISTAL FEMUR FRACTURE (FRACTURE AROUND FEMORAL NAIL);  Surgeon: Altamese [place], MD;  Location: [facility];  Service: Orthopedics;  Laterality: Left;  . TONSILLECTOMY    . TUBAL LIGATION    . VAGINAL HYSTERECTOMY  1972  . WRIST SURGERY     rt wrist hardwear removal     Allergies  Allergen Reactions  . Tape Rash and  Other (See Comments)    Please use paper tape      Family History  Problem Relation Age of Onset  . Diabetes Mother   . Heart failure Father   . Heart failure Maternal Aunt      Social History Ms. Luna reports that she has been smoking cigarettes. She has a 12.50 pack-year smoking history. She has never used smokeless tobacco. Ms. Yellin reports no history of alcohol use.   Review of Systems CONSTITUTIONAL: No weight loss, fever, chills, weakness or fatigue.  HEENT: Eyes: No visual loss, blurred vision, double vision or yellow sclerae.No hearing loss, sneezing, congestion, runny nose or sore throat.  SKIN: No rash or itching.  CARDIOVASCULAR: per hpi RESPIRATORY: No shortness of breath, cough or sputum.  GASTROINTESTINAL: No anorexia, nausea, vomiting or diarrhea. No abdominal pain or blood.  GENITOURINARY: No burning on urination, no polyuria NEUROLOGICAL: No headache, dizziness, syncope, paralysis, ataxia, numbness or tingling in the extremities. No change in bowel or bladder control.  MUSCULOSKELETAL: No muscle, back pain, joint pain or stiffness.  LYMPHATICS: No enlarged nodes. No history of splenectomy.  PSYCHIATRIC: No history of depression or anxiety.  ENDOCRINOLOGIC: No reports of sweating, cold or heat intolerance. No polyuria or polydipsia.  Marland Kitchen   Physical Examination Today's Vitals   06/11/20 1424  BP: (!) 150/60  Pulse: 83  SpO2: 97%  Weight: 156 lb (70.8 kg)  Height: 5' 3"  (1.6 m)   Body mass index is 27.63 kg/m.  Gen: resting comfortably, no acute distress HEENT: no scleral icterus, pupils equal round and reactive, no palptable cervical adenopathy,  CV: RRR, 2/6 systoli cmurmur rusb, no jvd Resp: Clear to auscultation bilaterally GI: abdomen is soft, non-tender, non-distended, normal bowel sounds, no hepatosplenomegaly MSK: extremities are warm, 1+ bilateral LE edema Skin: warm, no rash Neuro:  no focal deficits Psych: appropriate  affect   Diagnostic Studies 06/2018 echo Study Conclusions  - Left ventricle: The cavity size was normal. Wall thickness was  increased in a pattern of moderate LVH. Systolic function was vigorous. The estimated ejection fraction was in the range of 65% to 70%. Features are consistent with a pseudonormal left ventricular filling pattern, with concomitant abnormal relaxation and increased filling pressure (grade 2 diastolic dysfunction). - Aortic valve: Mildly calcified annulus. There was moderate to severe regurgitation. - Mitral valve: There was mild to moderate regurgitation. - Left atrium: The atrium was severely dilated.  Impressions:  - Technically difficult echo No parasternal views.  LV systolic function The aortic insufficiency is at least moderate - severe in severity .   08/2018 carotid US Summary: Right Carotid: Velocities in the right ICA are consistent with a 1-39% stenosis.  Left Carotid: Velocities in the left ICA are consistent with a 1-39% stenosis, calcific plaque may obscure higher velicity.  12/2019 echo 1. Left ventricular ejection fraction, by estimation, is 60 to 65%. The  left ventricle has normal function. The left ventricle has no regional  wall motion abnormalities. There is mild left ventricular hypertrophy.  Left ventricular diastolic parameters  are consistent with Grade II diastolic dysfunction (pseudonormalization).  Elevated left atrial pressure.  2. Right ventricular systolic function is normal. The right ventricular  size is normal. There is mildly elevated pulmonary artery systolic  pressure.  3. Left atrial size was severely dilated.  4. The mitral valve was not well visualized. Mild mitral valve  regurgitation. Moderate mitral stenosis.MV peak gradient, 13.4 mmHg. The  mean mitral valve gradient is 5.0 mmHg.  5. The aortic valve was not well visualized. Aortic valve regurgitation  is  moderate. Moderate aortic valve stenosis.  6. The inferior vena cava is normal in size with greater than 50%  respiratory variability, suggesting right atrial pressure of 3 mmHg.   Assessment and Plan   1. Afib/prior PSVT - no significant symptoms, continue beta blocker and CCB - continue eliquis.    2. CAD -no symptoms, continue current meds   3. Carotid stenosis -mild by imaing, continue to monitor  4. Aortic stenosis - no symptoms, moderate by echo this year. Continue to monitor.   F/u 6 months   Arnoldo Lenis, M.D.

## 2020-06-12 ENCOUNTER — Ambulatory Visit: Payer: Medicare Other | Admitting: Diagnostic Neuroimaging

## 2020-06-12 ENCOUNTER — Encounter: Payer: Self-pay | Admitting: Diagnostic Neuroimaging

## 2020-06-12 VITALS — BP 141/57 | HR 77 | Ht 63.0 in | Wt 156.8 lb

## 2020-06-12 DIAGNOSIS — M5442 Lumbago with sciatica, left side: Secondary | ICD-10-CM

## 2020-06-12 DIAGNOSIS — M25552 Pain in left hip: Secondary | ICD-10-CM

## 2020-06-12 DIAGNOSIS — M79605 Pain in left leg: Secondary | ICD-10-CM

## 2020-06-12 DIAGNOSIS — G8929 Other chronic pain: Secondary | ICD-10-CM | POA: Diagnosis not present

## 2020-06-12 NOTE — Progress Notes (Signed)
[facility]  PATIENT: [name] DOB: Oct 24, 1940  REFERRING CLINICIAN: Redmond School, MD HISTORY FROM: patient  REASON FOR VISIT: new consult    HISTORICAL  CHIEF COMPLAINT:  Chief Complaint  Patient presents with  . Evaluate for possible CVA    rm 7 New Pt, grand dgtr- [name]    HISTORY OF PRESENT ILLNESS:   79 year old female here for evaluation of left leg pain and weakness.  History of strokes, atrial fibrillation, hypertension, hyperlipidemia.  Patient was hospitalized in April 2021 for recurrent embolic strokes, then found to have atrial fibrillation, now on Eliquis.  Since that time patient was doing well at home with home PT.  At some point home PT completed and patient was less active.  Following this she felt more left leg pain and weakness.  She feels pain in her left hip and left lower back region.   REVIEW OF SYSTEMS: Full 14 system review of systems performed and negative with exception of: As per HPI.  ALLERGIES: Allergies  Allergen Reactions  . Tape Rash and Other (See Comments)    Please use paper tape    HOME MEDICATIONS: Outpatient Medications Prior to Visit  Medication Sig Dispense Refill  . acetaminophen (TYLENOL) 500 MG tablet Take 1,000 mg by mouth every 6 (six) hours as needed for moderate pain.     Marland Kitchen apixaban (ELIQUIS) 5 MG TABS tablet Take 1 tablet (5 mg total) by mouth 2 (two) times daily. 60 tablet 3  . atorvastatin (LIPITOR) 80 MG tablet Take 1 tablet (80 mg total) by mouth daily. 30 tablet 1  . calcium citrate (CALCITRATE - DOSED IN MG ELEMENTAL CALCIUM) 950 MG tablet Take 1 tablet (200 mg of elemental calcium total) by mouth daily. (Patient taking differently: Take 0.5 tablets by mouth daily. ) 30 tablet 1  . Coenzyme Q10 100 MG capsule Take 100 mg by mouth daily.     Marland Kitchen diltiazem (CARDIZEM CD) 120 MG 24 hr capsule Take 1 capsule (120 mg total) by mouth daily. 30 capsule 2  . docusate sodium (COLACE) 100 MG capsule  Take 100 mg by mouth 2 (two) times daily.     . fluticasone (FLONASE) 50 MCG/ACT nasal spray Place 2 sprays into both nostrils daily.    . furosemide (LASIX) 40 MG tablet Take 1 tablet (40 mg total) by mouth daily. May take additional 20 mg for severe swelling 120 tablet 3  . HYDROcodone-acetaminophen (NORCO) 10-325 MG tablet Take 1 tablet by mouth as needed for pain.     . metoprolol succinate (TOPROL-XL) 25 MG 24 hr tablet Take 1 tablet (25 mg total) by mouth in the morning and at bedtime. 60 tablet 6  . mirtazapine (REMERON) 30 MG tablet Take 30 mg by mouth at bedtime.     . Multiple Vitamins-Minerals (MULTIVITAMIN GUMMIES ADULT PO) Take 2 tablets by mouth daily.    . niacin (NIASPAN) 500 MG CR tablet Take 500 mg by mouth daily.     Marland Kitchen omeprazole (PRILOSEC) 20 MG capsule Take 1 capsule (20 mg total) by mouth daily. 90 capsule 3  . ondansetron (ZOFRAN) 4 MG tablet Take 4 mg by mouth every 8 (eight) hours as needed for nausea or vomiting.    . potassium chloride 20 MEQ/15ML (10%) SOLN Take 20 mEq by mouth daily.    Marland Kitchen Propylene Glycol (SYSTANE COMPLETE OP) Place 1 drop into both eyes in the morning and at bedtime.    . Multiple Vitamins-Minerals (CENTRUM ADULTS) TABS Take  1 tablet by mouth daily.     . potassium chloride (KLOR-CON) 10 MEQ tablet Take 2 tablets (20 mEq total) by mouth daily. 20 tablet 0   No facility-administered medications prior to visit.    PAST MEDICAL HISTORY: Past Medical History:  Diagnosis Date  . Anemia   . Aortic insufficiency    Moderate  . Back pain   . Carotid stenosis, bilateral   . Cirrhosis ([place])   . Closed left femoral fracture ([place]) 04/21/2017  . Coronary artery disease    Stent x 2 RCA 1995, cardiac catheterization 09/2016 showing only mild atherosclerosis  . DDD (degenerative disc disease), lumbar   . Essential hypertension   . Falls   . GERD (gastroesophageal reflux disease)   . Hiatal hernia   . History of stroke   . Hypercholesteremia   . IBS  (irritable bowel syndrome)   . Non-alcoholic fatty liver disease   . OSA (obstructive sleep apnea)    no CPAP  . Pericardial effusion    a. small by echo 2018.  Marland Kitchen PSVT (paroxysmal supraventricular tachycardia) ([place])   . Recurrent UTI   . Seizures ([place])   . Stroke ([place])   . Swallowing difficulty   . Type 2 diabetes mellitus ([place])    off medication  . Varices, esophageal ([place])     PAST SURGICAL HISTORY: Past Surgical History:  Procedure Laterality Date  . [place]  . APPENDECTOMY    . BACK SURGERY  1985  . CARDIAC CATHETERIZATION  [phone]   Stent to the proximal RCA after MI   . CARDIAC CATHETERIZATION N/A 09/26/2016   Procedure: Left Heart Cath and Coronary Angiography;  Surgeon: Leonie Man, MD;  Location: [place] [facility];  Service: Cardiovascular;  Laterality: N/A;  . CATARACT EXTRACTION W/PHACO Right 07/04/2014   Procedure: CATARACT EXTRACTION PHACO AND INTRAOCULAR LENS PLACEMENT (IOC);  Surgeon: Elta Guadeloupe T. Gershon Crane, MD;  Location: [facility];  Service: Ophthalmology;  Laterality: Right;  CDE:13.13  . COLONOSCOPY    . DILATION AND CURETTAGE OF UTERUS     x2  . Epi Retinal Membrane Peel Left   . ERCP    . ESOPHAGEAL BANDING N/A 04/01/2013   Procedure: ESOPHAGEAL BANDING;  Surgeon: Rogene Houston, MD;  [facility];  Service: Endoscopy;  Laterality: N/A;  . ESOPHAGEAL BANDING N/A 05/24/2013   Procedure: ESOPHAGEAL BANDING;  Surgeon: Rogene Houston, MD;  [facility];  Service: Endoscopy;  Laterality: N/A;  . ESOPHAGEAL BANDING N/A 06/21/2014   Procedure: ESOPHAGEAL BANDING;  Surgeon: Rogene Houston, MD;  [facility];  Service: Endoscopy;  Laterality: N/A;  . ESOPHAGOGASTRODUODENOSCOPY N/A 04/01/2013   Procedure: ESOPHAGOGASTRODUODENOSCOPY (EGD);  Surgeon: Rogene Houston, MD;  [facility];  Service: Endoscopy;  Laterality: N/A;  230-rescheduled to 8:30am Ann notified pt  . ESOPHAGOGASTRODUODENOSCOPY N/A  05/24/2013   Procedure: ESOPHAGOGASTRODUODENOSCOPY (EGD);  Surgeon: Rogene Houston, MD;  [facility];  Service: Endoscopy;  Laterality: N/A;  730  . ESOPHAGOGASTRODUODENOSCOPY N/A 06/21/2014   Procedure: ESOPHAGOGASTRODUODENOSCOPY (EGD);  Surgeon: Rogene Houston, MD;  [facility];  Service: Endoscopy;  Laterality: N/A;  930-rescheduled 10/14 @ 1200 Ann to notify pt  . EYE SURGERY  08   cataract surgery of the left eye  . FEMUR IM NAIL Left 03/02/2017   Procedure: INTRAMEDULLARY (IM) NAIL FEMORAL;  Surgeon: Rod Can, MD;  Location: [facility];  Service: Orthopedics;  Laterality: Left;  .  HARDWARE REMOVAL Right 01/17/2013   Procedure: REMOVAL OF HARDWARE AND EXCISION ULNAR STYLOID RIGHT WRIST;  Surgeon: Tennis Must, MD;  Location: [facility];  Service: Orthopedics;  Laterality: Right;  . MYRINGOTOMY  2012   both ears  . ORIF FEMUR FRACTURE Left 04/23/2017   Procedure: OPEN REDUCTION INTERNAL FIXATION (ORIF) DISTAL FEMUR FRACTURE (FRACTURE AROUND FEMORAL NAIL);  Surgeon: Altamese Jennings, MD;  Location: [facility];  Service: Orthopedics;  Laterality: Left;  . TONSILLECTOMY    . TUBAL LIGATION    . VAGINAL HYSTERECTOMY  1972  . WRIST SURGERY     rt wrist hardwear removal    FAMILY HISTORY: Family History  Problem Relation Age of Onset  . Diabetes Mother   . Heart failure Father   . Heart failure Maternal Aunt     SOCIAL HISTORY: Social History   Socioeconomic History  . Marital status: Widowed    Spouse name: Not on file  . Number of children: 2  . Years of education: 2 years college  . Highest education level: Some college, no degree  Occupational History    Employer: RETIRED  Tobacco Use  . Smoking status: Smoker, Current Status Unknown    Packs/day: 0.25    Years: 50.00    Pack years: 12.50    Types: Cigarettes    Last attempt to quit: 11/03/2017    Years since quitting: 2.6  . Smokeless tobacco: Never Used  . Tobacco comment: 2  cigarettes daily  Vaping Use  . Vaping Use: Never used  Substance and Sexual Activity  . Alcohol use: No    Alcohol/week: 0.0 standard drinks  . Drug use: No  . Sexual activity: Not Currently    Birth control/protection: Surgical  Other Topics Concern  . Not on file  Social History Narrative   06/12/20 Lives with grandchildren   Social Determinants of Health   Financial Resource Strain:   . Difficulty of Paying Living Expenses: Not on file  Food Insecurity:   . Worried About Charity fundraiser in the Last Year: Not on file  . Ran Out of Food in the Last Year: Not on file  Transportation Needs:   . Lack of Transportation (Medical): Not on file  . Lack of Transportation (Non-Medical): Not on file  Physical Activity:   . Days of Exercise per Week: Not on file  . Minutes of Exercise per Session: Not on file  Stress:   . Feeling of Stress : Not on file  Social Connections:   . Frequency of Communication with Friends and Family: Not on file  . Frequency of Social Gatherings with Friends and Family: Not on file  . Attends Religious Services: Not on file  . Active Member of Clubs or Organizations: Not on file  . Attends Archivist Meetings: Not on file  . Marital Status: Not on file  Intimate Partner Violence:   . Fear of Current or Ex-Partner: Not on file  . Emotionally Abused: Not on file  . Physically Abused: Not on file  . Sexually Abused: Not on file     PHYSICAL EXAM  GENERAL EXAM/CONSTITUTIONAL: Vitals:  Vitals:   06/12/20 1513  BP: (!) 141/57  Pulse: 77  Weight: 156 lb 12.8 oz (71.1 kg)  Height: 5' 3"  (1.6 m)     Body mass index is 27.78 kg/m. Wt Readings from Last 3 Encounters:  06/12/20 156 lb 12.8 oz (71.1 kg)  06/11/20 156 lb (70.8 kg)  06/04/20  152 lb 4.8 oz (69.1 kg)     Patient is in no distress; well developed, nourished and groomed; neck is supple  CARDIOVASCULAR:  Examination of carotid arteries is normal; no carotid  bruits  Regular rate and rhythm, no murmurs  Examination of peripheral vascular system by observation and palpation is normal  EYES:  Ophthalmoscopic exam of optic discs and posterior segments is normal; no papilledema or hemorrhages  No exam data present  MUSCULOSKELETAL:  Gait, strength, tone, movements noted in Neurologic exam below  NEUROLOGIC: MENTAL STATUS:  No flowsheet data found.  awake, alert, oriented to person, place and time  recent and remote memory intact  normal attention and concentration  language fluent, comprehension intact, naming intact  fund of knowledge appropriate  CRANIAL NERVE:   2nd - no papilledema on fundoscopic exam  2nd, 3rd, 4th, 6th - pupils equal and reactive to light, visual fields full to confrontation, extraocular muscles intact, no nystagmus  5th - facial sensation symmetric  7th - facial strength symmetric  8th - hearing intact  9th - palate elevates symmetrically, uvula midline  11th - shoulder shrug symmetric  12th - tongue protrusion midline  MOTOR:   normal bulk and tone, BUE 4 (EXCEPT RIGHT HAND DIGIT 2 EXT WEAKNESS 1/5)  BLE 3-4  SENSORY:   normal and symmetric to light touch, temperature, vibration; EXCEPT DECR IN LEFT FOOT  COORDINATION:   finger-nose-finger, fine finger movements normal  REFLEXES:   deep tendon reflexes TRACE and symmetric  GAIT/STATION:   narrow based gait; CAUTIOUS AND ANTALGIC WITH LEFT LEG PAIN     DIAGNOSTIC DATA (LABS, IMAGING, TESTING) - I reviewed patient records, labs, notes, testing and imaging myself where available.  Lab Results  Component Value Date   WBC 7.4 06/04/2020   HGB 11.9 06/04/2020   HCT 36.9 06/04/2020   MCV 91.8 06/04/2020   PLT 158 06/04/2020      Component Value Date/Time   NA 143 06/04/2020 1148   K 3.1 (L) 06/04/2020 1148   CL 103 06/04/2020 1148   CO2 28 06/04/2020 1148   GLUCOSE 112 06/04/2020 1148   BUN 22 06/04/2020 1148    CREATININE 0.88 06/04/2020 1148   CALCIUM 9.4 06/04/2020 1148   CALCIUM 8.4 (L) 04/22/2017 0514   PROT 6.4 06/04/2020 1148   PROT 6.3 03/24/2018 1134   ALBUMIN 3.7 01/03/2020 1304   ALBUMIN 4.1 03/24/2018 1134   AST 29 06/04/2020 1148   ALT 13 06/04/2020 1148   ALKPHOS 122 01/03/2020 1304   BILITOT 0.9 06/04/2020 1148   BILITOT 0.5 03/24/2018 1134   GFRNONAA 63 06/04/2020 1148   GFRAA 73 06/04/2020 1148   Lab Results  Component Value Date   CHOL 162 01/03/2020   HDL 63 01/03/2020   LDLCALC 83 01/03/2020   TRIG 78 01/03/2020   CHOLHDL 2.6 01/03/2020   Lab Results  Component Value Date   HGBA1C 5.2 01/03/2020   Lab Results  Component Value Date   KGMWNUUV25 366 01/04/2020   Lab Results  Component Value Date   TSH 0.813 01/04/2020    01/18/20 CT head [I reviewed images myself and agree with interpretation. -VRP]  1. No CT evidence of acute intracranial abnormality. 2. A few tiny supratentorial and infratentorial infarcts which were acute at time of prior MRI 01/03/2020 are not appreciable by CT. 3. Redemonstrated chronic right frontoparietal cortically based infarct. 4. Unchanged chronic lacunar infarcts within the deep gray nuclei and right cerebellum. Stable background generalized parenchymal  atrophy and chronic small vessel ischemic disease.  01/03/20 MRI brain [I reviewed images myself and agree with interpretation. -VRP]  - Few small acute to subacute infarcts involving multiple vascular territories. - Stable chronic findings detailed above.   ASSESSMENT AND PLAN  79 y.o. year old female here with:  Dx:  1. Left hip pain   2. Left leg pain   3. Chronic left-sided low back pain with left-sided sciatica     PLAN:  STROKE PREVENTION - eliquis, atorvastatin, BP control  LEFT HIP / LEG PAIN / LOW BACK PAIN - check xrays left hip and lumbar spine  GAIT DIFFICULTY - continue PT evaluation  Orders Placed This Encounter  Procedures  . DG Lumbar Spine  2-3 Views  . DG HIP UNILAT WITH PELVIS 2-3 VIEWS LEFT   Return for pending if symptoms worsen or fail to improve, return to PCP.    Penni Bombard, MD 77/11/7364, 8:15 PM Certified in Neurology, Neurophysiology and Neuroimaging  [facility] [address] [place], [place] [address] [phone]

## 2020-06-12 NOTE — Patient Instructions (Signed)
STROKE PREVENTION - eliquis, atorvastatin, BP control,   LEFT HIP / LEG PAIN / LOW BACK PAIN - check xrays left hip and lumbar spine  GAIT DIFFICULTY - continue PT evaluation

## 2020-06-12 NOTE — Patient Instructions (Signed)
[name]  06/12/2020     @PREFPERIOPPHARMACY @   Your procedure is scheduled on  06/15/2020  Report to [facility] at  1300 (1:00)  P.M.  Call this number if you have problems the morning of surgery:  [phone]   Remember:  Follow the diet instructions given to you by the office.                      Take these medicines the morning of surgery with A SIP OF WATER  Diltiazem, metoprolol, prilosec, zofran(if needed).    Do not wear jewelry, make-up or nail polish.  Do not wear lotions, powders, or perfumes. Please wear deodorant and brush your teeth.  Do not shave 48 hours prior to surgery.  Men may shave face and neck.  Do not bring valuables to the hospital.  [facility] is not responsible for any belongings or valuables.  Contacts, dentures or bridgework may not be worn into surgery.  Leave your suitcase in the car.  After surgery it may be brought to your room.  For patients admitted to the hospital, discharge time will be determined by your treatment team.  Patients discharged the day of surgery will not be allowed to drive home.   Name and phone number of your driver:   family Special instructions:  DO NOT smoke the morning of your procedure.  Please read over the following fact sheets that you were given. Anesthesia Post-op Instructions and Care and Recovery After Surgery       Upper Endoscopy, Adult, Care After This sheet gives you information about how to care for yourself after your procedure. Your health care provider may also give you more specific instructions. If you have problems or questions, contact your health care provider. What can I expect after the procedure? After the procedure, it is common to have:  A sore throat.  Mild stomach pain or discomfort.  Bloating.  Nausea. Follow these instructions at home:   Follow instructions from your health care provider about what to eat or drink after your procedure.  Return to your  normal activities as told by your health care provider. Ask your health care provider what activities are safe for you.  Take over-the-counter and prescription medicines only as told by your health care provider.  Do not drive for 24 hours if you were given a sedative during your procedure.  Keep all follow-up visits as told by your health care provider. This is important. Contact a health care provider if you have:  A sore throat that lasts longer than one day.  Trouble swallowing. Get help right away if:  You vomit blood or your vomit looks like coffee grounds.  You have: ? A fever. ? Bloody, black, or tarry stools. ? A severe sore throat or you cannot swallow. ? Difficulty breathing. ? Severe pain in your chest or abdomen. Summary  After the procedure, it is common to have a sore throat, mild stomach discomfort, bloating, and nausea.  Do not drive for 24 hours if you were given a sedative during the procedure.  Follow instructions from your health care provider about what to eat or drink after your procedure.  Return to your normal activities as told by your health care provider. This information is not intended to replace advice given to you by your health care provider. Make sure you discuss any questions you have with your health care provider.  Document Revised: 02/16/2018 Document Reviewed: 01/25/2018 Elsevier Patient Education  [place].  Esophageal Dilatation Esophageal dilatation, also called esophageal dilation, is a procedure to widen or open (dilate) a blocked or narrowed part of the esophagus. The esophagus is the part of the body that moves food and liquid from the mouth to the stomach. You may need this procedure if:  You have a buildup of scar tissue in your esophagus that makes it difficult, painful, or impossible to swallow. This can be caused by gastroesophageal reflux disease (GERD).  You have cancer of the esophagus.  There is a problem with  how food moves through your esophagus. In some cases, you may need this procedure repeated at a later time to dilate the esophagus gradually. Tell a health care provider about:  Any allergies you have.  All medicines you are taking, including vitamins, herbs, eye drops, creams, and over-the-counter medicines.  Any problems you or family members have had with anesthetic medicines.  Any blood disorders you have.  Any surgeries you have had.  Any medical conditions you have.  Any antibiotic medicines you are required to take before dental procedures.  Whether you are pregnant or may be pregnant. What are the risks? Generally, this is a safe procedure. However, problems may occur, including:  Bleeding due to a tear in the lining of the esophagus.  A hole (perforation) in the esophagus. What happens before the procedure?  Follow instructions from your health care provider about eating or drinking restrictions.  Ask your health care provider about changing or stopping your regular medicines. This is especially important if you are taking diabetes medicines or blood thinners.  Plan to have someone take you home from the hospital or clinic.  Plan to have a responsible adult care for you for at least 24 hours after you leave the hospital or clinic. This is important. What happens during the procedure?  You may be given a medicine to help you relax (sedative).  A numbing medicine may be sprayed into the back of your throat, or you may gargle the medicine.  Your health care provider may perform the dilatation using various surgical instruments, such as: ? Simple dilators. This instrument is carefully placed in the esophagus to stretch it. ? Guided wire bougies. This involves using an endoscope to insert a wire into the esophagus. A dilator is passed over this wire to enlarge the esophagus. Then the wire is removed. ? Balloon dilators. An endoscope with a small balloon at the end is  inserted into the esophagus. The balloon is inflated to stretch the esophagus and open it up. The procedure may vary among health care providers and hospitals. What happens after the procedure?  Your blood pressure, heart rate, breathing rate, and blood oxygen level will be monitored until the medicines you were given have worn off.  Your throat may feel slightly sore and numb. This will improve slowly over time.  You will not be allowed to eat or drink until your throat is no longer numb.  When you are able to drink, urinate, and sit on the edge of the bed without nausea or dizziness, you may be able to return home. Follow these instructions at home:  Take over-the-counter and prescription medicines only as told by your health care provider.  Do not drive for 24 hours if you were given a sedative during your procedure.  You should have a responsible adult with you for 24 hours after the procedure.  Follow  instructions from your health care provider about any eating or drinking restrictions.  Do not use any products that contain nicotine or tobacco, such as cigarettes and e-cigarettes. If you need help quitting, ask your health care provider.  Keep all follow-up visits as told by your health care provider. This is important. Get help right away if you:  Have a fever.  Have chest pain.  Have pain that is not relieved by medication.  Have trouble breathing.  Have trouble swallowing.  Vomit blood. Summary  Esophageal dilatation, also called esophageal dilation, is a procedure to widen or open (dilate) a blocked or narrowed part of the esophagus.  Plan to have someone take you home from the hospital or clinic.  For this procedure, a numbing medicine may be sprayed into the back of your throat, or you may gargle the medicine.  Do not drive for 24 hours if you were given a sedative during your procedure. This information is not intended to replace advice given to you by your  health care provider. Make sure you discuss any questions you have with your health care provider. Document Revised: 06/22/2019 Document Reviewed: 06/30/2017 Elsevier Patient Education  2020 [place] After These instructions provide you with information about caring for yourself after your procedure. Your health care provider may also give you more specific instructions. Your treatment has been planned according to current medical practices, but problems sometimes occur. Call your health care provider if you have any problems or questions after your procedure. What can I expect after the procedure? After your procedure, you may:  Feel sleepy for several hours.  Feel clumsy and have poor balance for several hours.  Feel forgetful about what happened after the procedure.  Have poor judgment for several hours.  Feel nauseous or vomit.  Have a sore throat if you had a breathing tube during the procedure. Follow these instructions at home: For at least 24 hours after the procedure:      Have a responsible adult stay with you. It is important to have someone help care for you until you are awake and alert.  Rest as needed.  Do not: ? Participate in activities in which you could fall or become injured. ? Drive. ? Use heavy machinery. ? Drink alcohol. ? Take sleeping pills or medicines that cause drowsiness. ? Make important decisions or sign legal documents. ? Take care of children on your own. Eating and drinking  Follow the diet that is recommended by your health care provider.  If you vomit, drink water, juice, or soup when you can drink without vomiting.  Make sure you have little or no nausea before eating solid foods. General instructions  Take over-the-counter and prescription medicines only as told by your health care provider.  If you have sleep apnea, surgery and certain medicines can increase your risk for breathing problems.  Follow instructions from your health care provider about wearing your sleep device: ? Anytime you are sleeping, including during daytime naps. ? While taking prescription pain medicines, sleeping medicines, or medicines that make you drowsy.  If you smoke, do not smoke without supervision.  Keep all follow-up visits as told by your health care provider. This is important. Contact a health care provider if:  You keep feeling nauseous or you keep vomiting.  You feel light-headed.  You develop a rash.  You have a fever. Get help right away if:  You have trouble breathing. Summary  For several hours  after your procedure, you may feel sleepy and have poor judgment.  Have a responsible adult stay with you for at least 24 hours or until you are awake and alert. This information is not intended to replace advice given to you by your health care provider. Make sure you discuss any questions you have with your health care provider. Document Revised: 11/23/2017 Document Reviewed: 12/16/2015 Elsevier Patient Education  [place].

## 2020-06-13 ENCOUNTER — Other Ambulatory Visit: Payer: Self-pay

## 2020-06-13 ENCOUNTER — Encounter (HOSPITAL_COMMUNITY)
Admission: RE | Admit: 2020-06-13 | Discharge: 2020-06-13 | Disposition: A | Discharge status: Home / Self Care | Payer: Medicare Other | Source: Ambulatory Visit | Attending: Gastroenterology | Admitting: Gastroenterology

## 2020-06-13 ENCOUNTER — Other Ambulatory Visit (HOSPITAL_COMMUNITY)
Admission: RE | Admit: 2020-06-13 | Discharge: 2020-06-13 | Disposition: A | Discharge status: Home / Self Care | Payer: Medicare Other | Source: Ambulatory Visit | Attending: Cardiology | Admitting: Cardiology

## 2020-06-13 ENCOUNTER — Other Ambulatory Visit (HOSPITAL_COMMUNITY)
Admission: RE | Admit: 2020-06-13 | Discharge: 2020-06-13 | Disposition: A | Discharge status: Home / Self Care | Payer: Medicare Other | Source: Ambulatory Visit | Attending: Gastroenterology | Admitting: Gastroenterology

## 2020-06-13 DIAGNOSIS — Z20822 Contact with and (suspected) exposure to covid-19: Secondary | ICD-10-CM | POA: Insufficient documentation

## 2020-06-13 DIAGNOSIS — I2510 Atherosclerotic heart disease of native coronary artery without angina pectoris: Secondary | ICD-10-CM | POA: Insufficient documentation

## 2020-06-13 LAB — CBC WITH DIFFERENTIAL/PLATELET
Abs Immature Granulocytes: 0.02 10*3/uL (ref 0.00–0.07)
Basophils Absolute: 0 10*3/uL (ref 0.0–0.1)
Basophils Relative: 0 %
Eosinophils Absolute: 0.3 10*3/uL (ref 0.0–0.5)
Eosinophils Relative: 5 %
HCT: 34.9 % — ABNORMAL LOW (ref 36.0–46.0)
Hemoglobin: 11.3 g/dL — ABNORMAL LOW (ref 12.0–15.0)
Immature Granulocytes: 0 %
Lymphocytes Relative: 18 %
Lymphs Abs: 1.2 10*3/uL (ref 0.7–4.0)
MCH: 30.4 pg (ref 26.0–34.0)
MCHC: 32.4 g/dL (ref 30.0–36.0)
MCV: 93.8 fL (ref 80.0–100.0)
Monocytes Absolute: 0.7 10*3/uL (ref 0.1–1.0)
Monocytes Relative: 11 %
Neutro Abs: 4.2 10*3/uL (ref 1.7–7.7)
Neutrophils Relative %: 66 %
Platelets: 149 10*3/uL — ABNORMAL LOW (ref 150–400)
RBC: 3.72 MIL/uL — ABNORMAL LOW (ref 3.87–5.11)
RDW: 14.5 % (ref 11.5–15.5)
WBC: 6.4 10*3/uL (ref 4.0–10.5)
nRBC: 0 % (ref 0.0–0.2)

## 2020-06-13 LAB — COMPREHENSIVE METABOLIC PANEL
ALT: 22 U/L (ref 0–44)
AST: 33 U/L (ref 15–41)
Albumin: 3.4 g/dL — ABNORMAL LOW (ref 3.5–5.0)
Alkaline Phosphatase: 106 U/L (ref 38–126)
Anion gap: 9 (ref 5–15)
BUN: 17 mg/dL (ref 8–23)
CO2: 25 mmol/L (ref 22–32)
Calcium: 8.6 mg/dL — ABNORMAL LOW (ref 8.9–10.3)
Chloride: 105 mmol/L (ref 98–111)
Creatinine, Ser: 0.75 mg/dL (ref 0.44–1.00)
GFR calc non Af Amer: >60 mL/min (ref >60)
Glucose, Bld: 131 mg/dL — ABNORMAL HIGH (ref 70–99)
Potassium: 3.8 mmol/L (ref 3.5–5.1)
Sodium: 139 mmol/L (ref 135–145)
Total Bilirubin: 1.9 mg/dL — ABNORMAL HIGH (ref 0.3–1.2)
Total Protein: 6.5 g/dL (ref 6.5–8.1)

## 2020-06-13 LAB — LIPID PANEL
Cholesterol: 125 mg/dL (ref 0–200)
HDL: 57 mg/dL (ref >40)
LDL Cholesterol: 59 mg/dL (ref 0–99)
Total CHOL/HDL Ratio: 2.2 RATIO
Triglycerides: 45 mg/dL (ref <150)
VLDL: 9 mg/dL (ref 0–40)

## 2020-06-13 LAB — SARS CORONAVIRUS 2 (TAT 6-24 HRS): SARS Coronavirus 2: NEGATIVE

## 2020-06-15 ENCOUNTER — Ambulatory Visit (HOSPITAL_COMMUNITY): Payer: Medicare Other | Admitting: Certified Registered"

## 2020-06-15 ENCOUNTER — Ambulatory Visit (HOSPITAL_COMMUNITY)
Admission: RE | Admit: 2020-06-15 | Discharge: 2020-06-15 | Disposition: A | Discharge status: Home / Self Care | Payer: Medicare Other | Attending: Gastroenterology | Admitting: Gastroenterology

## 2020-06-15 ENCOUNTER — Encounter (HOSPITAL_COMMUNITY)
Admission: RE | Disposition: A | Discharge status: Home / Self Care | Payer: Self-pay | Source: Home / Self Care | Attending: Gastroenterology

## 2020-06-15 ENCOUNTER — Other Ambulatory Visit: Payer: Self-pay

## 2020-06-15 ENCOUNTER — Encounter (HOSPITAL_COMMUNITY): Payer: Self-pay | Admitting: Gastroenterology

## 2020-06-15 DIAGNOSIS — E119 Type 2 diabetes mellitus without complications: Secondary | ICD-10-CM | POA: Insufficient documentation

## 2020-06-15 DIAGNOSIS — I10 Essential (primary) hypertension: Secondary | ICD-10-CM | POA: Diagnosis not present

## 2020-06-15 DIAGNOSIS — F17210 Nicotine dependence, cigarettes, uncomplicated: Secondary | ICD-10-CM | POA: Insufficient documentation

## 2020-06-15 DIAGNOSIS — I471 Supraventricular tachycardia: Secondary | ICD-10-CM | POA: Insufficient documentation

## 2020-06-15 DIAGNOSIS — I8510 Secondary esophageal varices without bleeding: Secondary | ICD-10-CM | POA: Insufficient documentation

## 2020-06-15 DIAGNOSIS — Z7901 Long term (current) use of anticoagulants: Secondary | ICD-10-CM | POA: Diagnosis not present

## 2020-06-15 DIAGNOSIS — K2100 Gastro-esophageal reflux disease with esophagitis, without bleeding: Secondary | ICD-10-CM | POA: Insufficient documentation

## 2020-06-15 DIAGNOSIS — Z955 Presence of coronary angioplasty implant and graft: Secondary | ICD-10-CM | POA: Diagnosis not present

## 2020-06-15 DIAGNOSIS — Z87440 Personal history of urinary (tract) infections: Secondary | ICD-10-CM | POA: Diagnosis not present

## 2020-06-15 DIAGNOSIS — Z8673 Personal history of transient ischemic attack (TIA), and cerebral infarction without residual deficits: Secondary | ICD-10-CM | POA: Diagnosis not present

## 2020-06-15 DIAGNOSIS — I4819 Other persistent atrial fibrillation: Secondary | ICD-10-CM | POA: Insufficient documentation

## 2020-06-15 DIAGNOSIS — G4733 Obstructive sleep apnea (adult) (pediatric): Secondary | ICD-10-CM | POA: Diagnosis not present

## 2020-06-15 DIAGNOSIS — I252 Old myocardial infarction: Secondary | ICD-10-CM | POA: Insufficient documentation

## 2020-06-15 DIAGNOSIS — K31819 Angiodysplasia of stomach and duodenum without bleeding: Secondary | ICD-10-CM | POA: Diagnosis not present

## 2020-06-15 DIAGNOSIS — I2510 Atherosclerotic heart disease of native coronary artery without angina pectoris: Secondary | ICD-10-CM | POA: Insufficient documentation

## 2020-06-15 DIAGNOSIS — I4891 Unspecified atrial fibrillation: Secondary | ICD-10-CM | POA: Diagnosis not present

## 2020-06-15 DIAGNOSIS — Z79899 Other long term (current) drug therapy: Secondary | ICD-10-CM | POA: Diagnosis not present

## 2020-06-15 DIAGNOSIS — K219 Gastro-esophageal reflux disease without esophagitis: Secondary | ICD-10-CM

## 2020-06-15 DIAGNOSIS — E7800 Pure hypercholesterolemia, unspecified: Secondary | ICD-10-CM | POA: Insufficient documentation

## 2020-06-15 DIAGNOSIS — I480 Paroxysmal atrial fibrillation: Secondary | ICD-10-CM | POA: Insufficient documentation

## 2020-06-15 DIAGNOSIS — K7581 Nonalcoholic steatohepatitis (NASH): Secondary | ICD-10-CM | POA: Insufficient documentation

## 2020-06-15 DIAGNOSIS — K7460 Unspecified cirrhosis of liver: Secondary | ICD-10-CM | POA: Insufficient documentation

## 2020-06-15 DIAGNOSIS — R1310 Dysphagia, unspecified: Secondary | ICD-10-CM | POA: Insufficient documentation

## 2020-06-15 DIAGNOSIS — Z13810 Encounter for screening for upper gastrointestinal disorder: Secondary | ICD-10-CM | POA: Diagnosis not present

## 2020-06-15 HISTORY — PX: ESOPHAGOGASTRODUODENOSCOPY (EGD) WITH PROPOFOL: SHX5813

## 2020-06-15 HISTORY — PX: ESOPHAGEAL BANDING: SHX5518

## 2020-06-15 LAB — GLUCOSE, CAPILLARY: Glucose-Capillary: 97 mg/dL (ref 70–99)

## 2020-06-15 SURGERY — ESOPHAGOGASTRODUODENOSCOPY (EGD) WITH PROPOFOL
Anesthesia: General

## 2020-06-15 MED ORDER — OMEPRAZOLE 20 MG PO CPDR: 40.0000 mg | DELAYED_RELEASE_CAPSULE | Freq: Two times a day (BID) | ORAL | 0 refills | Status: DC

## 2020-06-15 MED ORDER — LACTATED RINGERS IV SOLN: INTRAVENOUS | Status: DC

## 2020-06-15 MED ORDER — LIDOCAINE HCL (CARDIAC) PF 100 MG/5ML IV SOSY
PREFILLED_SYRINGE | INTRAVENOUS | Status: DC | PRN
  Administered 2020-06-15: 100 mg via INTRAVENOUS

## 2020-06-15 MED ORDER — PROPOFOL 10 MG/ML IV BOLUS
INTRAVENOUS | Status: DC | PRN
  Administered 2020-06-15: 80 mg via INTRAVENOUS
  Administered 2020-06-15: 100 ug/kg/min via INTRAVENOUS

## 2020-06-15 MED ORDER — CARVEDILOL 3.125 MG PO TABS: 3.1250 mg | ORAL_TABLET | Freq: Two times a day (BID) | ORAL | 0 refills | Status: DC

## 2020-06-15 MED ORDER — STERILE WATER FOR IRRIGATION IR SOLN
Status: DC | PRN
  Administered 2020-06-15: 100 mL

## 2020-06-15 MED ORDER — LACTATED RINGERS IV SOLN: INTRAVENOUS | Status: DC | PRN

## 2020-06-15 NOTE — Interval H&P Note (Signed)
History and Physical Interval Note:  06/15/2020 42:24 AM 79 year old female with past medical history of NASH cirrhosis complicated by esophageal variceal bleeding requiring banding in the past, coronary artery disease status post stent placement x2, hypertension, GERD, history of stroke, hyperlipidemia, irritable bowel syndrome, OSA, seizures and type 2 diabetes, who comes to the hospital for evaluation of pill dysphagia and esophageal varices.  Patient reports that she had an episode of dysphagia to solids around a month ago but this has completely resolved.  She reports at this moment that she is able to swallow any kind of food without any problem.  Denies having any dysphagia to pills at the moment.  States that she has been able to swallow without any issues.  Patient had esophagram performed on 04/18/2020 which showed thickening of the esophagus concerning for recurrent esophageal varices.  Also presence of nonspecific dysmotility.  Was also presence of thickening distal esophagus possibly due to esophagitis.  BP (!) 189/53   Pulse 72   Temp 98.5 F (36.9 C)   Resp 18   Ht 5' 3"  (1.6 m)   Wt 70.8 kg   SpO2 97%   BMI 27.63 kg/m  GENERAL: The patient is AO x3, in no acute distress. HEENT: Head is normocephalic and atraumatic. EOMI are intact. Mouth is well hydrated and without lesions. Obese. NECK: Supple. No masses LUNGS: Clear to auscultation. No presence of rhonchi/wheezing/rales. Adequate chest expansion HEART: RRR, normal s1 and s2. ABDOMEN: Soft, nontender, no guarding, no peritoneal signs, and nondistended. BS +. No masses. EXTREMITIES: Without any cyanosis, clubbing, rash, lesions or edema. NEUROLOGIC: AOx3, no focal motor deficit. SKIN: no jaundice, no rashes   [name]  has presented today for surgery, with the diagnosis of dysphagia, abnormal esophagram.  The various methods of treatment have been discussed with the patient and family. After consideration of risks,  benefits and other options for treatment, the patient has consented to  Procedure(s) with comments: ESOPHAGOGASTRODUODENOSCOPY (EGD) WITH PROPOFOL (N/A) - 230 ESOPHAGEAL DILATION (N/A) as a surgical intervention.  The patient's history has been reviewed, patient examined, no change in status, stable for surgery.  I have reviewed the patient's chart and labs.  Questions were answered to the patient's satisfaction.     Maylon Peppers [place]

## 2020-06-15 NOTE — Transfer of Care (Signed)
Immediate Anesthesia Transfer of Care Note  Patient: [name]  Procedure(s) Performed: ESOPHAGOGASTRODUODENOSCOPY (EGD) WITH PROPOFOL (N/A ) ESOPHAGEAL BANDING  Patient Location: PACU  Anesthesia Type:General  Level of Consciousness: drowsy  Airway & Oxygen Therapy: Patient Spontanous Breathing  Post-op Assessment: Report given to RN and Post -op Vital signs reviewed and stable  Post vital signs: Reviewed and stable  Last Vitals:  Vitals Value Taken Time  BP    Temp    Pulse    Resp    SpO2      Last Pain:  Vitals:   06/15/20 1212  TempSrc:   PainSc: 0-No pain      Patients Stated Pain Goal: 6 (75/30/10 4045)  Complications: No complications documented.

## 2020-06-15 NOTE — Anesthesia Postprocedure Evaluation (Signed)
Anesthesia Post Note  Patient: [facility]  Procedure(s) Performed: ESOPHAGOGASTRODUODENOSCOPY (EGD) WITH PROPOFOL (N/A ) ESOPHAGEAL BANDING  Patient location during evaluation: PACU Anesthesia Type: General Level of consciousness: awake Pain management: pain level controlled Vital Signs Assessment: post-procedure vital signs reviewed and stable Respiratory status: spontaneous breathing Cardiovascular status: blood pressure returned to baseline Postop Assessment: no headache Anesthetic complications: no   No complications documented.   Last Vitals:  Vitals:   06/15/20 1257 06/15/20 1311  BP:    Pulse:  73  Resp:  18  Temp:  36.7 C  SpO2: (P) 97% 98%    Last Pain:  Vitals:   06/15/20 1311  TempSrc: Oral  PainSc: 0-No pain                 Louann Sjogren

## 2020-06-15 NOTE — Op Note (Signed)
[facility] Patient Name: [name] Procedure Date: 06/15/2020 11:50 AM MRN: [id number] Date of Birth: 1940/10/30 Attending MD: Maylon Peppers ,  CSN: [id number] Age: 79 Admit Type: Outpatient Procedure:                Upper GI endoscopy Indications:              Dysphagia, Cirrhosis with suspected esophageal                            varices Providers:                Maylon Peppers, Janeece Riggers, RN, Casimer Bilis, Technician Referring MD:              Medicines:                Monitored Anesthesia Care Complications:            No immediate complications. Estimated Blood Loss:     Estimated blood loss: none. Procedure:                Pre-Anesthesia Assessment:                           - Prior to the procedure, a History and Physical                            was performed, and patient medications, allergies                            and sensitivities were reviewed. The patient's                            tolerance of previous anesthesia was reviewed.                           - The risks and benefits of the procedure and the                            sedation options and risks were discussed with the                            patient. All questions were answered and informed                            consent was obtained.                           - ASA Grade Assessment: III - A patient with severe                            systemic disease.                           After obtaining informed consent, the endoscope was  passed under direct vision. Throughout the                            procedure, the patient's blood pressure, pulse, and                            oxygen saturations were monitored continuously. The                            [id number]-[id number] ([id number]) scope was introduced through the                            mouth, and advanced to the second part of duodenum.                            The  upper GI endoscopy was accomplished without                            difficulty. The patient tolerated the procedure                            well. Scope In: 12:15:55 PM Scope Out: 12:27:03 PM Total Procedure Duration: 0 hours 11 minutes 8 seconds  Findings:      No endoscopic abnormality was evident in the esophagus to explain the       patient's complaint of dysphagia.      Grade II varices were found in the lower third of the esophagus. One       band was successfully placed with complete eradication, resulting in       deflation of varices. There was no bleeding at the end of the procedure.       Further bands could not be deployed as there was not adequate suctioning       effect due to variceal flattening.      The entire examined stomach was normal.      The examined duodenum was normal. Impression:               - No endoscopic esophageal abnormality to explain                            patient's dysphagia.                           - Grade II esophageal varices. Completely                            eradicated. Banded.                           - Normal stomach.                           - Normal examined duodenum.                           - No specimens collected. Moderate Sedation:      Per Anesthesia Care Recommendation:           -  Discharge patient to home (ambulatory).                           - Resume previous diet.                           - Repeat upper endoscopy in 4 weeks for endoscopic                            band ligation.                           - Increase omeprazole to 40 mg BID for 2 weeks,                            then go back to 20 mg daily.                           - Stop metoprolol and start Coreg 3.125 mg BID. Procedure Code(s):        --- Professional ---                           [phone], GC, Esophagogastroduodenoscopy, flexible,                            transoral; with band ligation of esophageal/gastric                             varices Diagnosis Code(s):        --- Professional ---                           R13.10, Dysphagia, unspecified                           K74.60, Unspecified cirrhosis of liver                           I85.10, Secondary esophageal varices without                            bleeding CPT copyright 2019 American Medical Association. All rights reserved. The codes documented in this report are preliminary and upon coder review may  be revised to meet current compliance requirements. Maylon Peppers, MD Maylon Peppers,  06/15/2020 12:35:39 PM This report has been signed electronically. Number of Addenda: 0

## 2020-06-15 NOTE — Discharge Instructions (Signed)
Esophageal Dilatation Esophageal dilatation, also called esophageal dilation, is a procedure to widen or open (dilate) a blocked or narrowed part of the esophagus. The esophagus is the part of the body that moves food and liquid from the mouth to the stomach. You may need this procedure if:  You have a buildup of scar tissue in your esophagus that makes it difficult, painful, or impossible to swallow. This can be caused by gastroesophageal reflux disease (GERD).  You have cancer of the esophagus.  There is a problem with how food moves through your esophagus. In some cases, you may need this procedure repeated at a later time to dilate the esophagus gradually. Tell a health care provider about:  Any allergies you have.  All medicines you are taking, including vitamins, herbs, eye drops, creams, and over-the-counter medicines.  Any problems you or family members have had with anesthetic medicines.  Any blood disorders you have.  Any surgeries you have had.  Any medical conditions you have.  Any antibiotic medicines you are required to take before dental procedures.  Whether you are pregnant or may be pregnant. What are the risks? Generally, this is a safe procedure. However, problems may occur, including:  Bleeding due to a tear in the lining of the esophagus.  A hole (perforation) in the esophagus. What happens before the procedure?  Follow instructions from your health care provider about eating or drinking restrictions.  Ask your health care provider about changing or stopping your regular medicines. This is especially important if you are taking diabetes medicines or blood thinners.  Plan to have someone take you home from the hospital or clinic.  Plan to have a responsible adult care for you for at least 24 hours after you leave the hospital or clinic. This is important. What happens during the procedure?  You may be given a medicine to help you relax (sedative).  A  numbing medicine may be sprayed into the back of your throat, or you may gargle the medicine.  Your health care provider may perform the dilatation using various surgical instruments, such as: ? Simple dilators. This instrument is carefully placed in the esophagus to stretch it. ? Guided wire bougies. This involves using an endoscope to insert a wire into the esophagus. A dilator is passed over this wire to enlarge the esophagus. Then the wire is removed. ? Balloon dilators. An endoscope with a small balloon at the end is inserted into the esophagus. The balloon is inflated to stretch the esophagus and open it up. The procedure may vary among health care providers and hospitals. What happens after the procedure?  Your blood pressure, heart rate, breathing rate, and blood oxygen level will be monitored until the medicines you were given have worn off.  Your throat may feel slightly sore and numb. This will improve slowly over time.  You will not be allowed to eat or drink until your throat is no longer numb.  When you are able to drink, urinate, and sit on the edge of the bed without nausea or dizziness, you may be able to return home. Follow these instructions at home:  Take over-the-counter and prescription medicines only as told by your health care provider.  Do not drive for 24 hours if you were given a sedative during your procedure.  You should have a responsible adult with you for 24 hours after the procedure.  Follow instructions from your health care provider about any eating or drinking restrictions.  Do not  use any products that contain nicotine or tobacco, such as cigarettes and e-cigarettes. If you need help quitting, ask your health care provider.  Keep all follow-up visits as told by your health care provider. This is important. Get help right away if you:  Have a fever.  Have chest pain.  Have pain that is not relieved by medication.  Have trouble  breathing.  Have trouble swallowing.  Vomit blood. Summary  Esophageal dilatation, also called esophageal dilation, is a procedure to widen or open (dilate) a blocked or narrowed part of the esophagus.  Plan to have someone take you home from the hospital or clinic.  For this procedure, a numbing medicine may be sprayed into the back of your throat, or you may gargle the medicine.  Do not drive for 24 hours if you were given a sedative during your procedure. This information is not intended to replace advice given to you by your health care provider. Make sure you discuss any questions you have with your health care provider. Document Revised: 06/22/2019 Document Reviewed: 06/30/2017 Elsevier Patient Education  2020 [place]. Upper Endoscopy, Adult, Care After This sheet gives you information about how to care for yourself after your procedure. Your health care provider may also give you more specific instructions. If you have problems or questions, contact your health care provider. What can I expect after the procedure? After the procedure, it is common to have:  A sore throat.  Mild stomach pain or discomfort.  Bloating.  Nausea. Follow these instructions at home:   Follow instructions from your health care provider about what to eat or drink after your procedure.  Return to your normal activities as told by your health care provider. Ask your health care provider what activities are safe for you.  Take over-the-counter and prescription medicines only as told by your health care provider.  Do not drive for 24 hours if you were given a sedative during your procedure.  Keep all follow-up visits as told by your health care provider. This is important. Contact a health care provider if you have:  A sore throat that lasts longer than one day.  Trouble swallowing. Get help right away if:  You vomit blood or your vomit looks like coffee grounds.  You have: ? A  fever. ? Bloody, black, or tarry stools. ? A severe sore throat or you cannot swallow. ? Difficulty breathing. ? Severe pain in your chest or abdomen. Summary  After the procedure, it is common to have a sore throat, mild stomach discomfort, bloating, and nausea.  Do not drive for 24 hours if you were given a sedative during the procedure.  Follow instructions from your health care provider about what to eat or drink after your procedure.  Return to your normal activities as told by your health care provider. This information is not intended to replace advice given to you by your health care provider. Make sure you discuss any questions you have with your health care provider. Document Revised: 02/16/2018 Document Reviewed: 01/25/2018 Elsevier Patient Education  2020 [place] are being discharged to home.  Resume your previous diet.  Your physician has recommended a repeat upper endoscopy in four weeks for endoscopic band ligation.  Increase omeprazole to 40 mg BID for 2 weeks, then go back to 20 mg daily. Stop metoprolol and start Coreg 3.125 mg BID. Restart Eliquis tomorrow.

## 2020-06-15 NOTE — Anesthesia Preprocedure Evaluation (Signed)
Anesthesia Evaluation  Patient identified by MRN, date of birth, ID band Patient awake    Reviewed: Allergy & Precautions, H&P , NPO status , Patient's Chart, lab work & pertinent test results, reviewed documented beta blocker date and time   Airway Mallampati: II  TM Distance: >3 FB Neck ROM: full    Dental no notable dental hx.    Pulmonary sleep apnea ,    Pulmonary exam normal breath sounds clear to auscultation       Cardiovascular Exercise Tolerance: Good hypertension, + CAD and + Past MI  Atrial Fibrillation  Rhythm:irregular Rate:Normal     Neuro/Psych Seizures -,  TIACVA, No Residual Symptoms negative psych ROS   GI/Hepatic hiatal hernia, GERD  Medicated,(+) Hepatitis -  Endo/Other  negative endocrine ROSdiabetes  Renal/GU negative Renal ROS  negative genitourinary   Musculoskeletal   Abdominal   Peds  Hematology  (+) Blood dyscrasia, anemia ,   Anesthesia Other Findings   Reproductive/Obstetrics negative OB ROS                             Anesthesia Physical Anesthesia Plan  ASA: III  Anesthesia Plan: General   Post-op Pain Management:    Induction:   PONV Risk Score and Plan: Propofol infusion  Airway Management Planned:   Additional Equipment:   Intra-op Plan:   Post-operative Plan:   Informed Consent: I have reviewed the patients History and Physical, chart, labs and discussed the procedure including the risks, benefits and alternatives for the proposed anesthesia with the patient or authorized representative who has indicated his/her understanding and acceptance.     Dental Advisory Given  Plan Discussed with: CRNA  Anesthesia Plan Comments:         Anesthesia Quick Evaluation

## 2020-06-16 ENCOUNTER — Other Ambulatory Visit (HOSPITAL_COMMUNITY): Payer: Self-pay | Admitting: Physician Assistant

## 2020-06-20 ENCOUNTER — Ambulatory Visit (HOSPITAL_COMMUNITY)
Admission: RE | Admit: 2020-06-20 | Discharge: 2020-06-20 | Disposition: A | Discharge status: Home / Self Care | Payer: Medicare Other | Source: Ambulatory Visit | Attending: Diagnostic Neuroimaging | Admitting: Diagnostic Neuroimaging

## 2020-06-20 ENCOUNTER — Other Ambulatory Visit (INDEPENDENT_AMBULATORY_CARE_PROVIDER_SITE_OTHER): Payer: Self-pay

## 2020-06-20 ENCOUNTER — Telehealth (INDEPENDENT_AMBULATORY_CARE_PROVIDER_SITE_OTHER): Payer: Self-pay | Admitting: *Deleted

## 2020-06-20 ENCOUNTER — Telehealth: Payer: Self-pay | Admitting: Diagnostic Neuroimaging

## 2020-06-20 ENCOUNTER — Encounter (INDEPENDENT_AMBULATORY_CARE_PROVIDER_SITE_OTHER): Payer: Self-pay

## 2020-06-20 ENCOUNTER — Other Ambulatory Visit: Payer: Self-pay

## 2020-06-20 DIAGNOSIS — M79605 Pain in left leg: Secondary | ICD-10-CM

## 2020-06-20 DIAGNOSIS — M545 Low back pain, unspecified: Secondary | ICD-10-CM | POA: Diagnosis not present

## 2020-06-20 DIAGNOSIS — M25552 Pain in left hip: Secondary | ICD-10-CM | POA: Insufficient documentation

## 2020-06-20 DIAGNOSIS — M5442 Lumbago with sciatica, left side: Secondary | ICD-10-CM | POA: Insufficient documentation

## 2020-06-20 DIAGNOSIS — I8510 Secondary esophageal varices without bleeding: Secondary | ICD-10-CM

## 2020-06-20 DIAGNOSIS — G8929 Other chronic pain: Secondary | ICD-10-CM | POA: Insufficient documentation

## 2020-06-20 LAB — CUP PACEART REMOTE DEVICE CHECK
Date Time Interrogation Session: 20211013021630
Implantable Pulse Generator Implant Date: 20210602

## 2020-06-20 NOTE — Telephone Encounter (Signed)
Pt called wanting to inform the provider that she has had the xrays done today that were ordered for her and she is wanting to know if the PT that is also being ordered for her can be done in home. Please advise.

## 2020-06-20 NOTE — Telephone Encounter (Addendum)
Patient is scheduled for EGD w/Banding on 07/27/20 and instructions have been mailed to patient

## 2020-06-20 NOTE — Telephone Encounter (Signed)
Per op note - patient needs repeat EGD with banding in 4 weeks - esophageal varices & cirrhosis - room 3

## 2020-06-21 ENCOUNTER — Encounter (HOSPITAL_COMMUNITY): Payer: Self-pay | Admitting: Gastroenterology

## 2020-06-25 ENCOUNTER — Ambulatory Visit (INDEPENDENT_AMBULATORY_CARE_PROVIDER_SITE_OTHER): Payer: Medicare Other

## 2020-06-25 DIAGNOSIS — I639 Cerebral infarction, unspecified: Secondary | ICD-10-CM | POA: Diagnosis not present

## 2020-06-28 ENCOUNTER — Telehealth: Payer: Self-pay | Admitting: *Deleted

## 2020-06-28 NOTE — Telephone Encounter (Signed)
Called patient and informed her that her x ray of hip/pelvis showed unremarkable imaging results. The xray of lumbar spine showed some degenerative changes but no major findings. She will continue with PT evaluation. I apologized as I had not received the phone call she placed on 06/20/20. I advised her Dr Gerarda Fraction ordered her PT, so she should call him to change the order to in home. Patient verbalized understanding, appreciation.

## 2020-06-29 ENCOUNTER — Other Ambulatory Visit: Payer: Self-pay | Admitting: Cardiology

## 2020-06-29 ENCOUNTER — Other Ambulatory Visit: Payer: Self-pay | Admitting: *Deleted

## 2020-06-29 MED ORDER — FUROSEMIDE 40 MG PO TABS: 40.0000 mg | ORAL_TABLET | Freq: Every day | ORAL | 3 refills | Status: DC

## 2020-06-29 MED ORDER — FUROSEMIDE 40 MG PO TABS
40.0000 mg | ORAL_TABLET | Freq: Every day | ORAL | 3 refills | Status: DC
Start: 2020-06-29 — End: 2020-07-27

## 2020-06-29 NOTE — Telephone Encounter (Signed)
New message   They never received the new order for prescritption  *STAT* If patient is at the pharmacy, call can be transferred to refill team.   1. Which medications need to be refilled? (please list name of each medication and dose if known) furosemide (LASIX) 40 MG tablet  2. Which pharmacy/location (including street and city if local pharmacy) is medication to be sent to? [place]   3. Do they need a 30 day or 90 day supply?  [place]

## 2020-06-29 NOTE — Telephone Encounter (Signed)
Refilled lasix as requested

## 2020-07-02 NOTE — Progress Notes (Signed)
Carelink Summary Report / Loop Recorder 

## 2020-07-07 DIAGNOSIS — I10 Essential (primary) hypertension: Secondary | ICD-10-CM | POA: Diagnosis not present

## 2020-07-07 DIAGNOSIS — E7849 Other hyperlipidemia: Secondary | ICD-10-CM | POA: Diagnosis not present

## 2020-07-16 ENCOUNTER — Other Ambulatory Visit (HOSPITAL_COMMUNITY): Payer: Self-pay | Admitting: Physician Assistant

## 2020-07-17 ENCOUNTER — Other Ambulatory Visit: Payer: Self-pay | Admitting: *Deleted

## 2020-07-17 DIAGNOSIS — Z8673 Personal history of transient ischemic attack (TIA), and cerebral infarction without residual deficits: Secondary | ICD-10-CM | POA: Diagnosis not present

## 2020-07-17 DIAGNOSIS — R2689 Other abnormalities of gait and mobility: Secondary | ICD-10-CM | POA: Diagnosis not present

## 2020-07-17 DIAGNOSIS — R1310 Dysphagia, unspecified: Secondary | ICD-10-CM | POA: Diagnosis not present

## 2020-07-17 DIAGNOSIS — M6281 Muscle weakness (generalized): Secondary | ICD-10-CM | POA: Diagnosis not present

## 2020-07-17 DIAGNOSIS — I2510 Atherosclerotic heart disease of native coronary artery without angina pectoris: Secondary | ICD-10-CM | POA: Diagnosis not present

## 2020-07-17 DIAGNOSIS — I10 Essential (primary) hypertension: Secondary | ICD-10-CM | POA: Diagnosis not present

## 2020-07-17 DIAGNOSIS — G4709 Other insomnia: Secondary | ICD-10-CM | POA: Diagnosis not present

## 2020-07-17 NOTE — Patient Outreach (Signed)
[facility]) Care Management  07/17/2020  [name] December 31, 1940 [id number]  Unsuccessful outreach attempt made to patient. Patient answered the phone and stated she would not be able to speak today.   Plan: RN Health Coach will call patient within the month of December.  Emelia Loron RN, BSN [place] [phone] [email or web address]

## 2020-07-18 ENCOUNTER — Other Ambulatory Visit (INDEPENDENT_AMBULATORY_CARE_PROVIDER_SITE_OTHER): Payer: Self-pay | Admitting: Gastroenterology

## 2020-07-18 MED ORDER — CARVEDILOL 3.125 MG PO TABS: 3.1250 mg | ORAL_TABLET | Freq: Two times a day (BID) | ORAL | 5 refills | Status: DC

## 2020-07-23 ENCOUNTER — Ambulatory Visit (INDEPENDENT_AMBULATORY_CARE_PROVIDER_SITE_OTHER): Payer: Medicare Other

## 2020-07-23 DIAGNOSIS — I639 Cerebral infarction, unspecified: Secondary | ICD-10-CM

## 2020-07-23 LAB — CUP PACEART REMOTE DEVICE CHECK
Date Time Interrogation Session: 20211115011719
Implantable Pulse Generator Implant Date: 20210602

## 2020-07-24 NOTE — Patient Instructions (Signed)
[name]  07/24/2020     @PREFPERIOPPHARMACY @   Your procedure is scheduled on  07/27/2020.  Report to [facility] at  1115  A.M.  Call this number if you have problems the morning of surgery:  [phone]   Remember:  Follow the diet instructions given to you by the office.                      Take these medicines the morning of surgery with A SIP OF WATER  Carvedilol, diltiazem, robaxin(if needed), remoreon(if needed), zofran(if needed). Follow instructions given to you by the office about your eliquis.    Do not wear jewelry, make-up or nail polish.  Do not wear lotions, powders, or perfumes. Please wear deodorant and brush your teeth.  Do not shave 48 hours prior to surgery.  Men may shave face and neck.  Do not bring valuables to the hospital.  [facility] is not responsible for any belongings or valuables.  Contacts, dentures or bridgework may not be worn into surgery.  Leave your suitcase in the car.  After surgery it may be brought to your room.  For patients admitted to the hospital, discharge time will be determined by your treatment team.  Patients discharged the day of surgery will not be allowed to drive home.   Name and phone number of your driver:   family Special instructions:   DO NOT smoke the morning of your procedure.  Please read over the following fact sheets that you were given. Anesthesia Post-op Instructions and Care and Recovery After Surgery       Upper Endoscopy, Adult, Care After This sheet gives you information about how to care for yourself after your procedure. Your health care provider may also give you more specific instructions. If you have problems or questions, contact your health care provider. What can I expect after the procedure? After the procedure, it is common to have:  A sore throat.  Mild stomach pain or discomfort.  Bloating.  Nausea. Follow these instructions at home:   Follow instructions from your  health care provider about what to eat or drink after your procedure.  Return to your normal activities as told by your health care provider. Ask your health care provider what activities are safe for you.  Take over-the-counter and prescription medicines only as told by your health care provider.  Do not drive for 24 hours if you were given a sedative during your procedure.  Keep all follow-up visits as told by your health care provider. This is important. Contact a health care provider if you have:  A sore throat that lasts longer than one day.  Trouble swallowing. Get help right away if:  You vomit blood or your vomit looks like coffee grounds.  You have: ? A fever. ? Bloody, black, or tarry stools. ? A severe sore throat or you cannot swallow. ? Difficulty breathing. ? Severe pain in your chest or abdomen. Summary  After the procedure, it is common to have a sore throat, mild stomach discomfort, bloating, and nausea.  Do not drive for 24 hours if you were given a sedative during the procedure.  Follow instructions from your health care provider about what to eat or drink after your procedure.  Return to your normal activities as told by your health care provider. This information is not intended to replace advice given to you by your health care provider. Make sure  you discuss any questions you have with your health care provider. Document Revised: 02/16/2018 Document Reviewed: 01/25/2018 Elsevier Patient Education  2020 [place] After These instructions provide you with information about caring for yourself after your procedure. Your health care provider may also give you more specific instructions. Your treatment has been planned according to current medical practices, but problems sometimes occur. Call your health care provider if you have any problems or questions after your procedure. What can I expect after the procedure? After  your procedure, you may:  Feel sleepy for several hours.  Feel clumsy and have poor balance for several hours.  Feel forgetful about what happened after the procedure.  Have poor judgment for several hours.  Feel nauseous or vomit.  Have a sore throat if you had a breathing tube during the procedure. Follow these instructions at home: For at least 24 hours after the procedure:      Have a responsible adult stay with you. It is important to have someone help care for you until you are awake and alert.  Rest as needed.  Do not: ? Participate in activities in which you could fall or become injured. ? Drive. ? Use heavy machinery. ? Drink alcohol. ? Take sleeping pills or medicines that cause drowsiness. ? Make important decisions or sign legal documents. ? Take care of children on your own. Eating and drinking  Follow the diet that is recommended by your health care provider.  If you vomit, drink water, juice, or soup when you can drink without vomiting.  Make sure you have little or no nausea before eating solid foods. General instructions  Take over-the-counter and prescription medicines only as told by your health care provider.  If you have sleep apnea, surgery and certain medicines can increase your risk for breathing problems. Follow instructions from your health care provider about wearing your sleep device: ? Anytime you are sleeping, including during daytime naps. ? While taking prescription pain medicines, sleeping medicines, or medicines that make you drowsy.  If you smoke, do not smoke without supervision.  Keep all follow-up visits as told by your health care provider. This is important. Contact a health care provider if:  You keep feeling nauseous or you keep vomiting.  You feel light-headed.  You develop a rash.  You have a fever. Get help right away if:  You have trouble breathing. Summary  For several hours after your procedure, you may  feel sleepy and have poor judgment.  Have a responsible adult stay with you for at least 24 hours or until you are awake and alert. This information is not intended to replace advice given to you by your health care provider. Make sure you discuss any questions you have with your health care provider. Document Revised: 11/23/2017 Document Reviewed: 12/16/2015 Elsevier Patient Education  [place].

## 2020-07-24 NOTE — Progress Notes (Signed)
Carelink Summary Report / Loop Recorder 

## 2020-07-25 DIAGNOSIS — I2510 Atherosclerotic heart disease of native coronary artery without angina pectoris: Secondary | ICD-10-CM | POA: Diagnosis not present

## 2020-07-25 DIAGNOSIS — M6281 Muscle weakness (generalized): Secondary | ICD-10-CM | POA: Diagnosis not present

## 2020-07-25 DIAGNOSIS — R1310 Dysphagia, unspecified: Secondary | ICD-10-CM | POA: Diagnosis not present

## 2020-07-25 DIAGNOSIS — I10 Essential (primary) hypertension: Secondary | ICD-10-CM | POA: Diagnosis not present

## 2020-07-25 DIAGNOSIS — G4709 Other insomnia: Secondary | ICD-10-CM | POA: Diagnosis not present

## 2020-07-25 DIAGNOSIS — Z8673 Personal history of transient ischemic attack (TIA), and cerebral infarction without residual deficits: Secondary | ICD-10-CM | POA: Diagnosis not present

## 2020-07-25 DIAGNOSIS — R2689 Other abnormalities of gait and mobility: Secondary | ICD-10-CM | POA: Diagnosis not present

## 2020-07-26 ENCOUNTER — Telehealth: Payer: Self-pay | Admitting: Cardiology

## 2020-07-26 ENCOUNTER — Encounter (HOSPITAL_COMMUNITY): Payer: Self-pay

## 2020-07-26 ENCOUNTER — Encounter (HOSPITAL_COMMUNITY)
Admission: RE | Admit: 2020-07-26 | Discharge: 2020-07-26 | Disposition: A | Discharge status: Home / Self Care | Payer: Medicare Other | Source: Ambulatory Visit | Attending: Gastroenterology | Admitting: Gastroenterology

## 2020-07-26 ENCOUNTER — Other Ambulatory Visit: Payer: Self-pay

## 2020-07-26 ENCOUNTER — Other Ambulatory Visit (HOSPITAL_COMMUNITY)
Admission: RE | Admit: 2020-07-26 | Discharge: 2020-07-26 | Disposition: A | Discharge status: Home / Self Care | Payer: Medicare Other | Source: Ambulatory Visit | Attending: Gastroenterology | Admitting: Gastroenterology

## 2020-07-26 DIAGNOSIS — R1310 Dysphagia, unspecified: Secondary | ICD-10-CM | POA: Diagnosis not present

## 2020-07-26 DIAGNOSIS — Z20822 Contact with and (suspected) exposure to covid-19: Secondary | ICD-10-CM | POA: Insufficient documentation

## 2020-07-26 DIAGNOSIS — Z8673 Personal history of transient ischemic attack (TIA), and cerebral infarction without residual deficits: Secondary | ICD-10-CM | POA: Diagnosis not present

## 2020-07-26 DIAGNOSIS — G4709 Other insomnia: Secondary | ICD-10-CM | POA: Diagnosis not present

## 2020-07-26 DIAGNOSIS — Z01812 Encounter for preprocedural laboratory examination: Secondary | ICD-10-CM | POA: Insufficient documentation

## 2020-07-26 DIAGNOSIS — M6281 Muscle weakness (generalized): Secondary | ICD-10-CM | POA: Diagnosis not present

## 2020-07-26 DIAGNOSIS — I10 Essential (primary) hypertension: Secondary | ICD-10-CM | POA: Diagnosis not present

## 2020-07-26 DIAGNOSIS — I2510 Atherosclerotic heart disease of native coronary artery without angina pectoris: Secondary | ICD-10-CM | POA: Diagnosis not present

## 2020-07-26 DIAGNOSIS — R2689 Other abnormalities of gait and mobility: Secondary | ICD-10-CM | POA: Diagnosis not present

## 2020-07-26 HISTORY — DX: Type 2 diabetes mellitus without complications: E11.9

## 2020-07-26 LAB — COMPREHENSIVE METABOLIC PANEL
ALT: 22 U/L (ref 0–44)
AST: 31 U/L (ref 15–41)
Albumin: 3.3 g/dL — ABNORMAL LOW (ref 3.5–5.0)
Alkaline Phosphatase: 122 U/L (ref 38–126)
Anion gap: 8 (ref 5–15)
BUN: 17 mg/dL (ref 8–23)
CO2: 25 mmol/L (ref 22–32)
Calcium: 8.8 mg/dL — ABNORMAL LOW (ref 8.9–10.3)
Chloride: 107 mmol/L (ref 98–111)
Creatinine, Ser: 0.78 mg/dL (ref 0.44–1.00)
GFR, Estimated: >60 mL/min (ref >60)
Glucose, Bld: 124 mg/dL — ABNORMAL HIGH (ref 70–99)
Potassium: 3.1 mmol/L — ABNORMAL LOW (ref 3.5–5.1)
Sodium: 140 mmol/L (ref 135–145)
Total Bilirubin: 1.5 mg/dL — ABNORMAL HIGH (ref 0.3–1.2)
Total Protein: 6.1 g/dL — ABNORMAL LOW (ref 6.5–8.1)

## 2020-07-26 LAB — CBC
HCT: 35.6 % — ABNORMAL LOW (ref 36.0–46.0)
Hemoglobin: 11.5 g/dL — ABNORMAL LOW (ref 12.0–15.0)
MCH: 29.8 pg (ref 26.0–34.0)
MCHC: 32.3 g/dL (ref 30.0–36.0)
MCV: 92.2 fL (ref 80.0–100.0)
Platelets: 144 10*3/uL — ABNORMAL LOW (ref 150–400)
RBC: 3.86 MIL/uL — ABNORMAL LOW (ref 3.87–5.11)
RDW: 14.5 % (ref 11.5–15.5)
WBC: 5.1 10*3/uL (ref 4.0–10.5)
nRBC: 0 % (ref 0.0–0.2)

## 2020-07-26 LAB — PROTIME-INR
INR: 1.3 — ABNORMAL HIGH (ref 0.8–1.2)
Prothrombin Time: 15.3 seconds — ABNORMAL HIGH (ref 11.4–15.2)

## 2020-07-26 IMAGING — CT CT ANGIO NECK
2 of 11 series · 7 of 34 positions shown · IV contrast (Omnipaque or Isovue)
Comparison: 07/06/2018

CLINICAL DATA: Small strokes on MRI

EXAM:
CT ANGIOGRAPHY HEAD AND NECK
TECHNIQUE: Multidetector CT imaging of the head and neck was performed using
the standard protocol during bolus administration of intravenous
contrast. Multiplanar CT image reconstructions and MIPs were
obtained to evaluate the vascular anatomy. Carotid stenosis
measurements (when applicable) are obtained utilizing NASCET
criteria, using the distal internal carotid diameter as the
denominator.
CONTRAST:  75mL OMNIPAQUE IOHEXOL 350 MG/ML SOLN

[Series 8: cta head & neck · axial · 0.51mm/px · z∈[-43,+65]mm · 2 of 163 slices shown]
[im 55/163  soft-tissue]
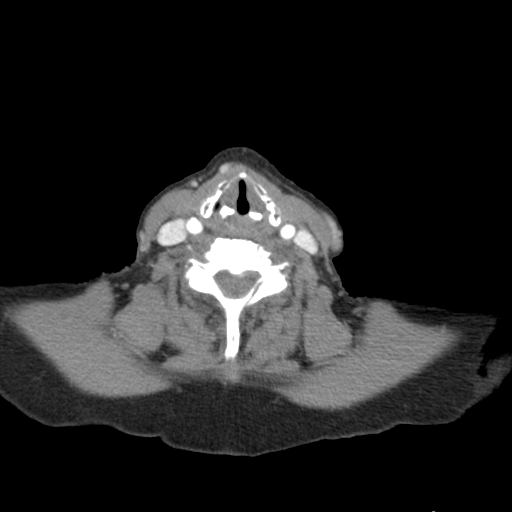
[im 109/163  soft-tissue]
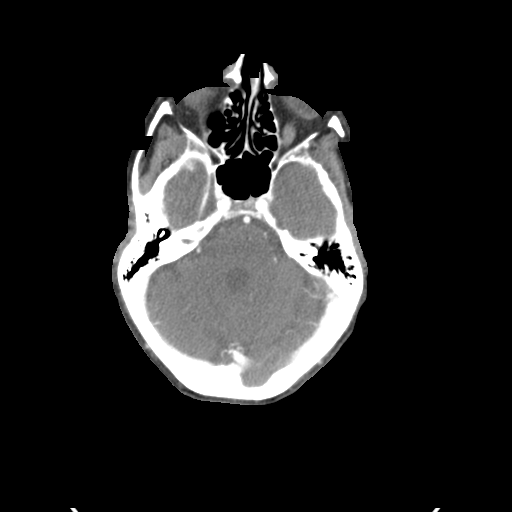

[Series 10: ax thins · axial · 0.44mm/px · z∈[-96,+121]mm · 5 of 328 slices shown]
[im 55/328  soft-tissue]
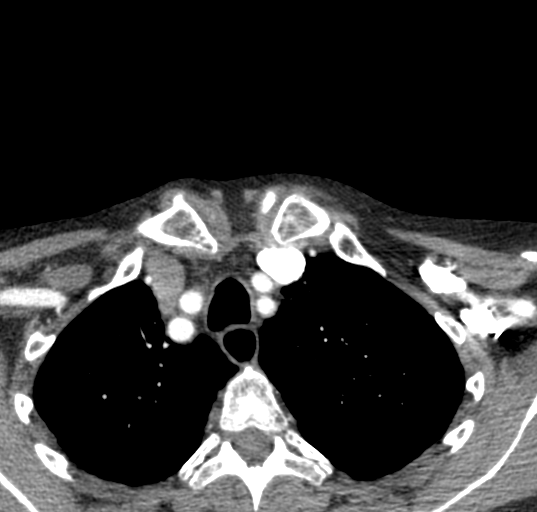
[im 110/328  bone]
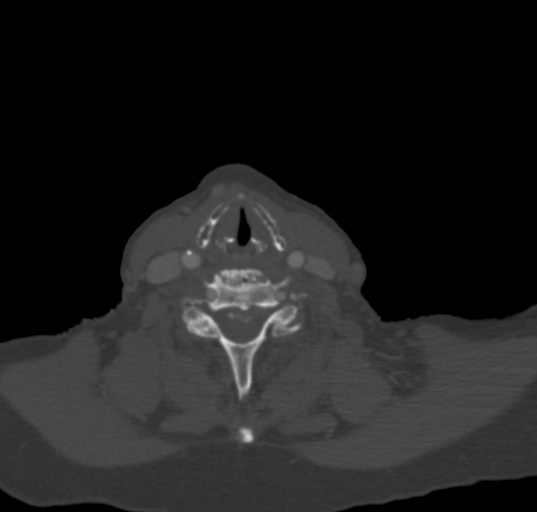
[im 164/328  soft-tissue]
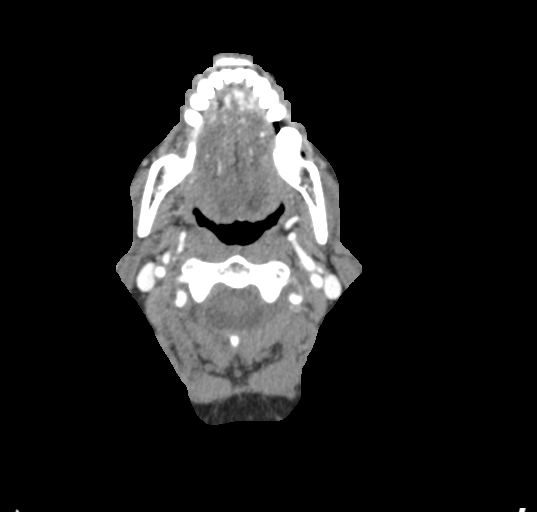
[im 219/328  bone]
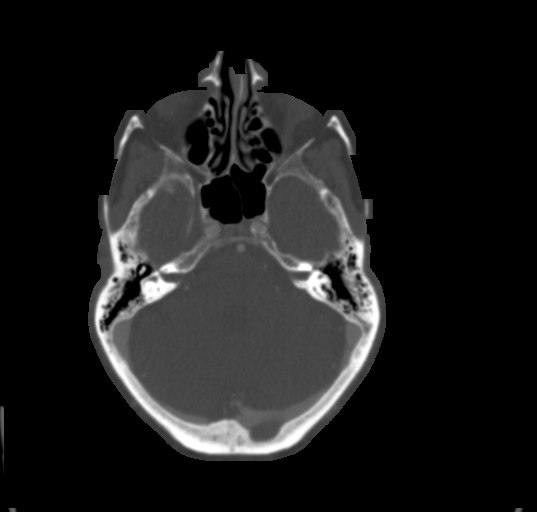
[im 273/328  soft-tissue]
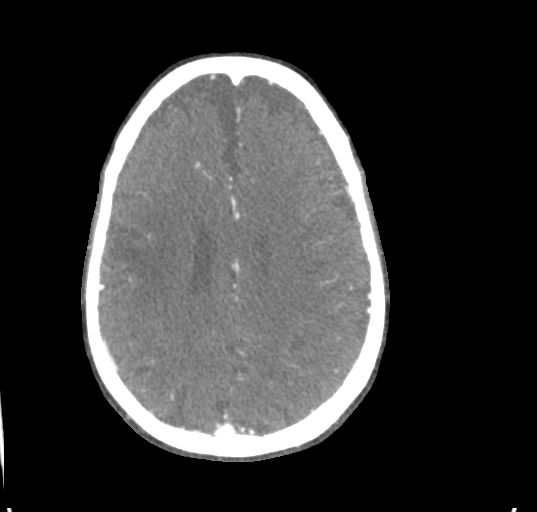

[7 of 34 positions shown; findings below may reference images not displayed]

FINDINGS: CT HEAD FINDINGS

Brain: There is no acute intracranial hemorrhage, mass effect, or
edema. Recent small infarcts seen on same day MRI. There is a
chronic right MCA territory infarction. Additional chronic small
vessel infarcts of the central gray matter and right cerebellum.
There is no extra-axial fluid collection. No hydrocephalus.

Vascular: There is atherosclerotic calcification at the skull base.

Skull: Calvarium is unremarkable.

Sinuses/Orbits: No acute finding.

Other: None.

Review of the MIP images confirms the above findings

CTA NECK FINDINGS

Aortic arch: There is calcified plaque along the arch and patent
great vessel origins.

Right carotid system: Patent. Mild calcified plaque along the common
carotid. There is calcified plaque at the ICA origin causing less
than 50% stenosis.

Left carotid system: Patent. Calcified plaque at the bifurcation
causing less than 50% stenosis.

Vertebral arteries: Patent and codominant.

Skeleton: Multilevel degenerative changes are similar in appearance

Other neck: Stable thyroid nodule. A new 2.5 x 1.5 cm area of
subcutaneous soft tissue at the left cheek is noted.

Upper chest: No apical lung mass.

Review of the MIP images confirms the above findings

CTA HEAD FINDINGS

Anterior circulation: Intracranial internal carotid arteries are
patent with calcified plaque but no significant stenosis. Anterior
and middle cerebral arteries are patent. Anterior communicating
artery is present.

Posterior circulation: Intracranial vertebral arteries are patent
with mild calcified plaque. Basilar artery is patent. Posterior
cerebral arteries are patent. Stable focal moderate right proximal
P2 PCA stenosis.

Venous sinuses: As permitted by contrast timing, patent.

Review of the MIP images confirms the above findings
IMPRESSION: No acute intracranial hemorrhage. Small recent infarcts as seen on
same day MRI.

No large vessel occlusion. Stable appearance of plaque at the ICA
origins causing less than 50% stenosis.

Stable moderate right P2 PCA stenosis.

New subcutaneous soft tissue at the left cheek, which could reflect
a hematoma.

## 2020-07-26 IMAGING — CT CT ANGIO HEAD
2 of 12 series · 6 of 34 positions shown · IV contrast (Omnipaque or Isovue)
Comparison: 07/06/2018

CLINICAL DATA: Small strokes on MRI

EXAM:
CT ANGIOGRAPHY HEAD AND NECK
TECHNIQUE: Multidetector CT imaging of the head and neck was performed using
the standard protocol during bolus administration of intravenous
contrast. Multiplanar CT image reconstructions and MIPs were
obtained to evaluate the vascular anatomy. Carotid stenosis
measurements (when applicable) are obtained utilizing NASCET
criteria, using the distal internal carotid diameter as the
denominator.
CONTRAST:  75mL OMNIPAQUE IOHEXOL 350 MG/ML SOLN

[Series 9: cta head & neck · axial · 0.44mm/px · z∈[-84,+117]mm · 4 of 670 slices shown]
[im 134/670  soft-tissue]
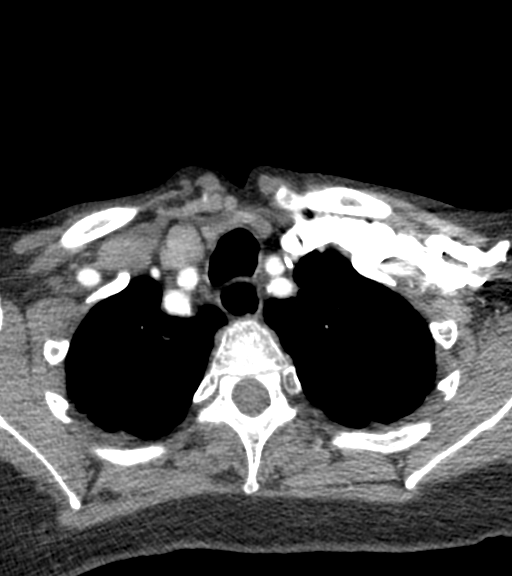
[im 268/670  bone]
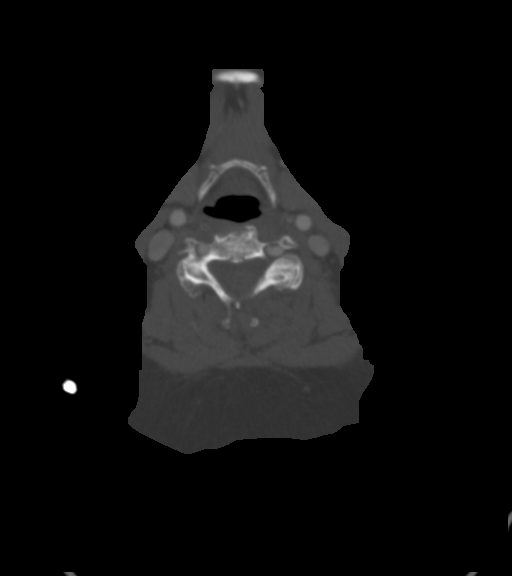
[im 402/670  soft-tissue]
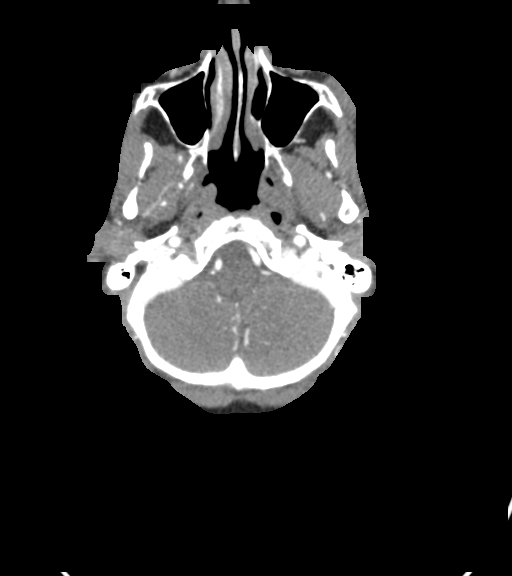
[im 536/670  bone]
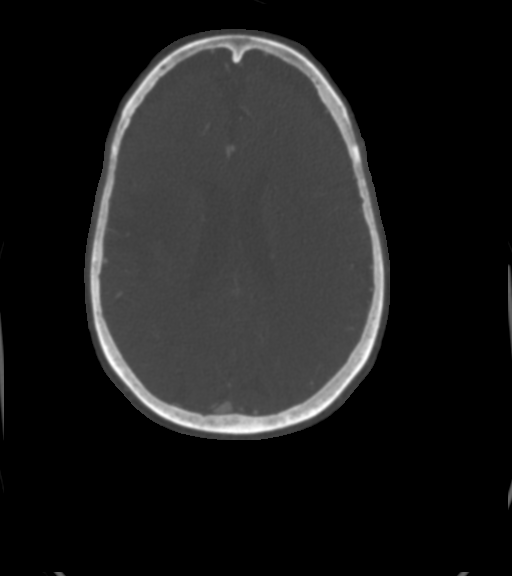

[Series 10: ax thins · axial · 0.44mm/px · z∈[-41,+67]mm · 2 of 328 slices shown]
[im 110/328  soft-tissue]
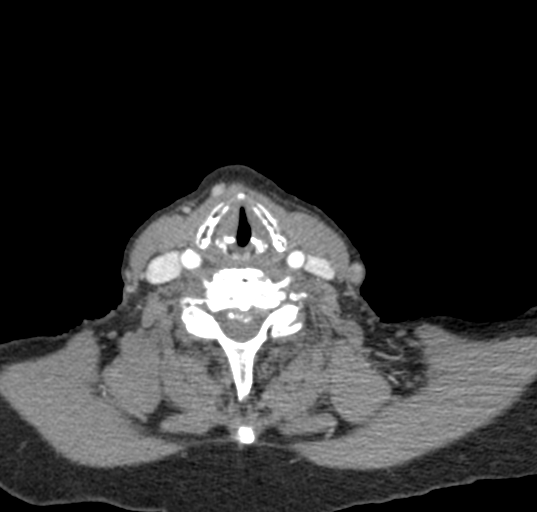
[im 219/328  soft-tissue]
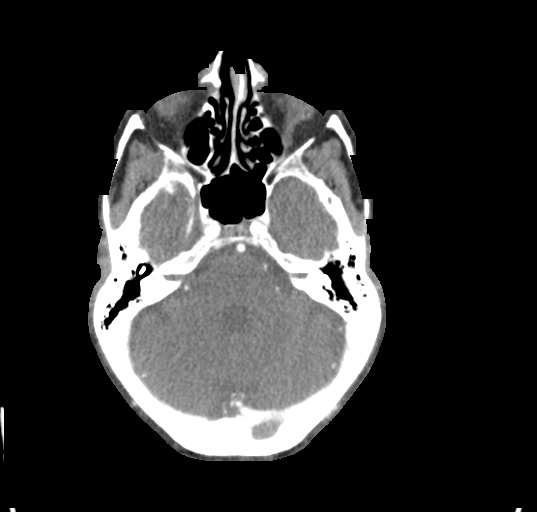

[6 of 34 positions shown; findings below may reference images not displayed]

FINDINGS: CT HEAD FINDINGS

Brain: There is no acute intracranial hemorrhage, mass effect, or
edema. Recent small infarcts seen on same day MRI. There is a
chronic right MCA territory infarction. Additional chronic small
vessel infarcts of the central gray matter and right cerebellum.
There is no extra-axial fluid collection. No hydrocephalus.

Vascular: There is atherosclerotic calcification at the skull base.

Skull: Calvarium is unremarkable.

Sinuses/Orbits: No acute finding.

Other: None.

Review of the MIP images confirms the above findings

CTA NECK FINDINGS

Aortic arch: There is calcified plaque along the arch and patent
great vessel origins.

Right carotid system: Patent. Mild calcified plaque along the common
carotid. There is calcified plaque at the ICA origin causing less
than 50% stenosis.

Left carotid system: Patent. Calcified plaque at the bifurcation
causing less than 50% stenosis.

Vertebral arteries: Patent and codominant.

Skeleton: Multilevel degenerative changes are similar in appearance

Other neck: Stable thyroid nodule. A new 2.5 x 1.5 cm area of
subcutaneous soft tissue at the left cheek is noted.

Upper chest: No apical lung mass.

Review of the MIP images confirms the above findings

CTA HEAD FINDINGS

Anterior circulation: Intracranial internal carotid arteries are
patent with calcified plaque but no significant stenosis. Anterior
and middle cerebral arteries are patent. Anterior communicating
artery is present.

Posterior circulation: Intracranial vertebral arteries are patent
with mild calcified plaque. Basilar artery is patent. Posterior
cerebral arteries are patent. Stable focal moderate right proximal
P2 PCA stenosis.

Venous sinuses: As permitted by contrast timing, patent.

Review of the MIP images confirms the above findings
IMPRESSION: No acute intracranial hemorrhage. Small recent infarcts as seen on
same day MRI.

No large vessel occlusion. Stable appearance of plaque at the ICA
origins causing less than 50% stenosis.

Stable moderate right P2 PCA stenosis.

New subcutaneous soft tissue at the left cheek, which could reflect
a hematoma.

## 2020-07-26 IMAGING — MR MR HEAD W/O CM
7 of 10 series · 32 of 48 positions shown · non-contrast
Comparison: 0384

CLINICAL DATA: Difficulty walking, history of stroke

EXAM:
MRI HEAD WITHOUT CONTRAST
TECHNIQUE: Multiplanar, multiecho pulse sequences of the brain and surrounding
structures were obtained without intravenous contrast.

[Series 2: T1 · sagittal · 5.0mm · 0.39mm/px · 1 of 21 slices shown]
[im 1/21]
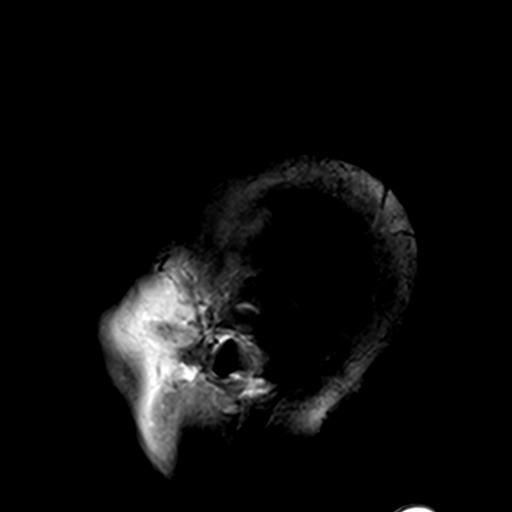

[Series 3: DWI · axial · 3.0mm · 0.75mm/px · z∈[-104,+53]mm · 11 of 110 slices shown (1 of 2)]
[im 1/110]
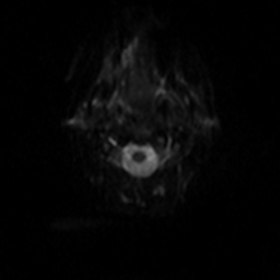
[im 11/110]
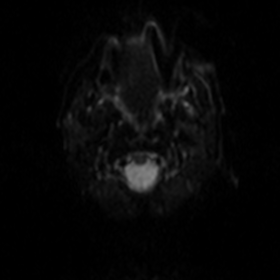
[im 22/110]
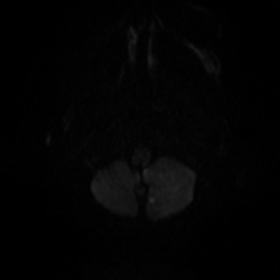
[im 33/110]
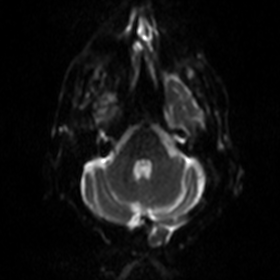
[im 44/110]
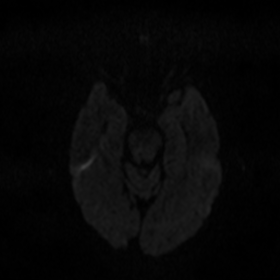
[im 55/110]
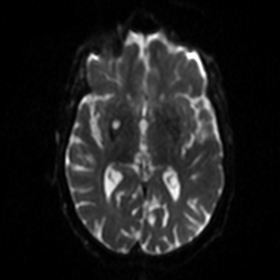
[im 66/110]
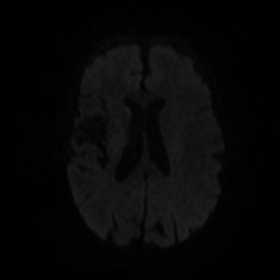
[im 77/110]
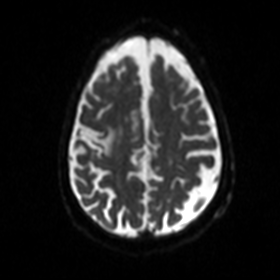
[im 88/110]
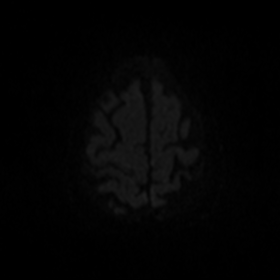
[im 99/110]
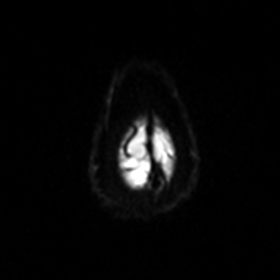
[im 110/110]
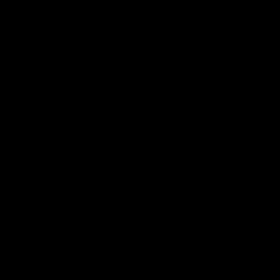

[Series 5: DWI · coronal · 5.0mm · 0.48mm/px · 7 of 72 slices shown (2 of 2)]
[im 1/72]
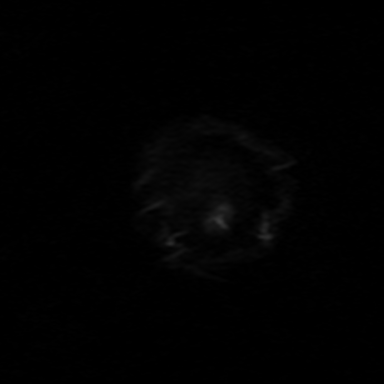
[im 12/72]
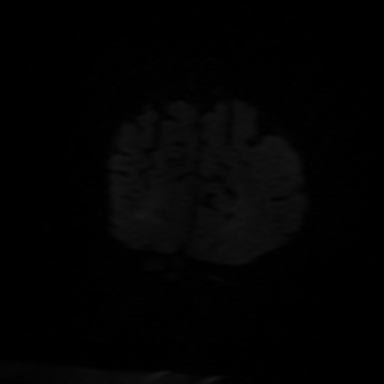
[im 24/72]
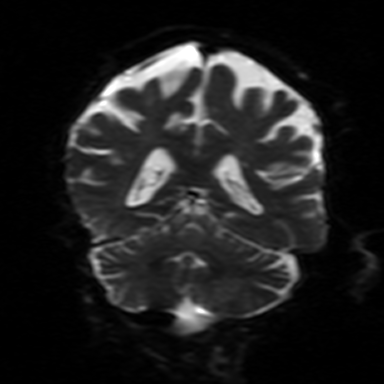
[im 36/72]
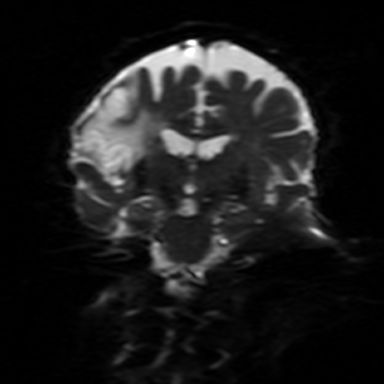
[im 48/72]
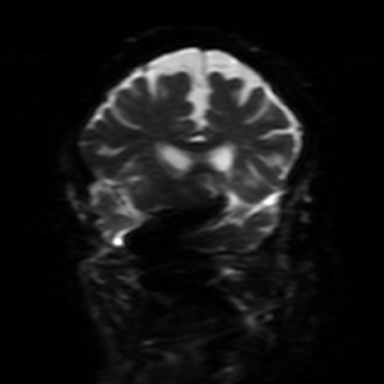
[im 60/72]
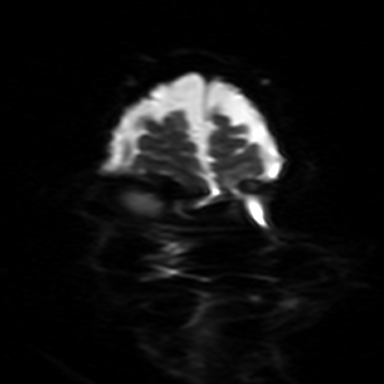
[im 72/72]
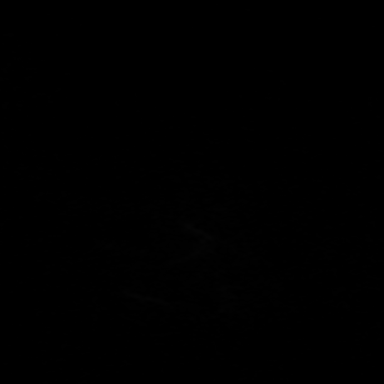

[Series 7: T2 · axial · 5.0mm · 0.53mm/px · z∈[-95,+44]mm · 2 of 23 slices shown (1 of 3)]
[im 1/23]
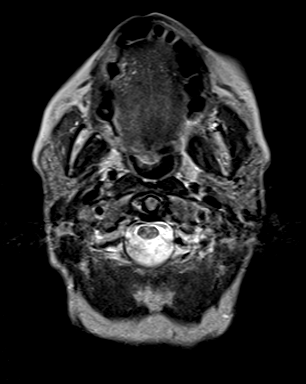
[im 23/23]
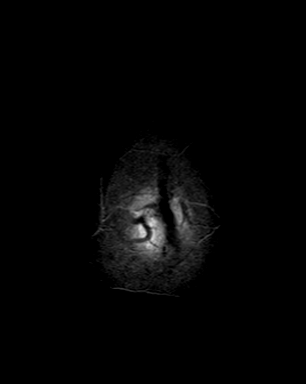

[Series 8: T2 · axial · 4.0mm · 0.37mm/px · z∈[-99,+42]mm · 3 of 30 slices shown (2 of 3)]
[im 1/30]
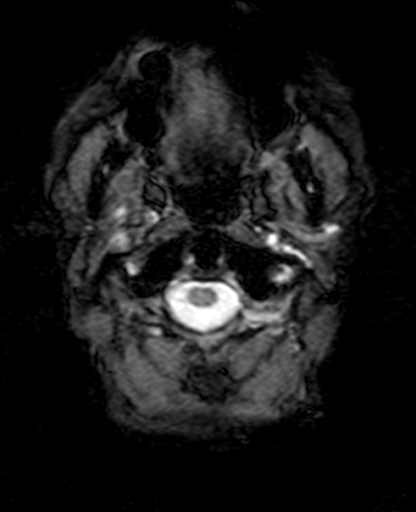
[im 15/30]
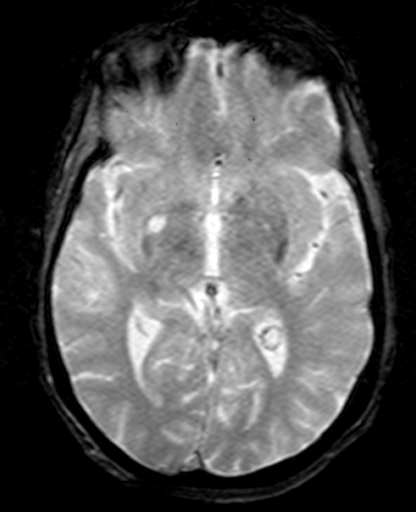
[im 30/30]
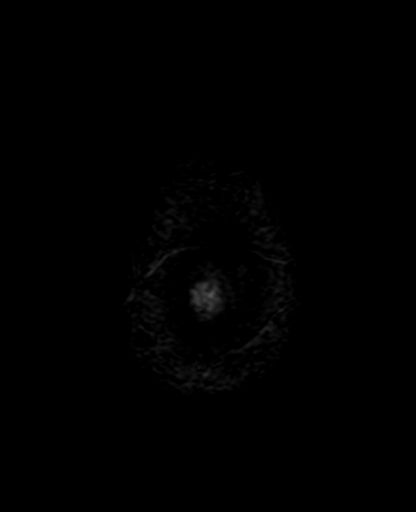

[Series 9: FLAIR · axial · 3.0mm · 0.31mm/px · z∈[-93,+41]mm · 5 of 47 slices shown]
[im 1/47]
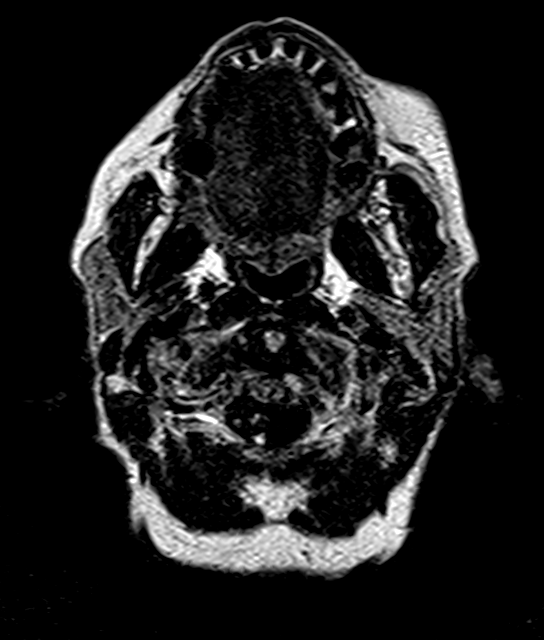
[im 12/47]
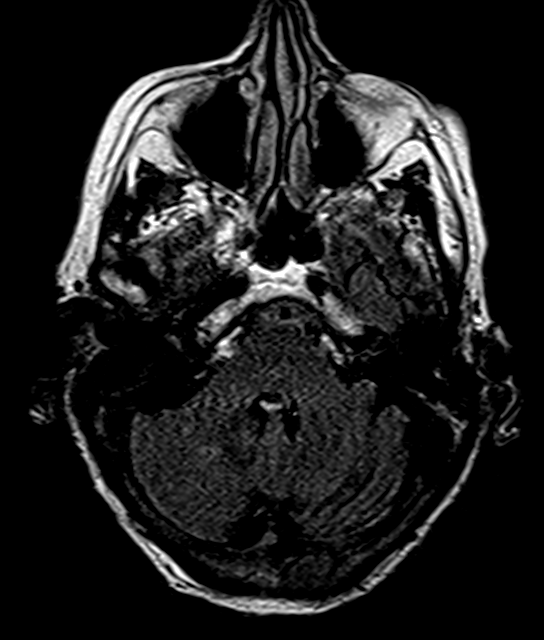
[im 24/47]
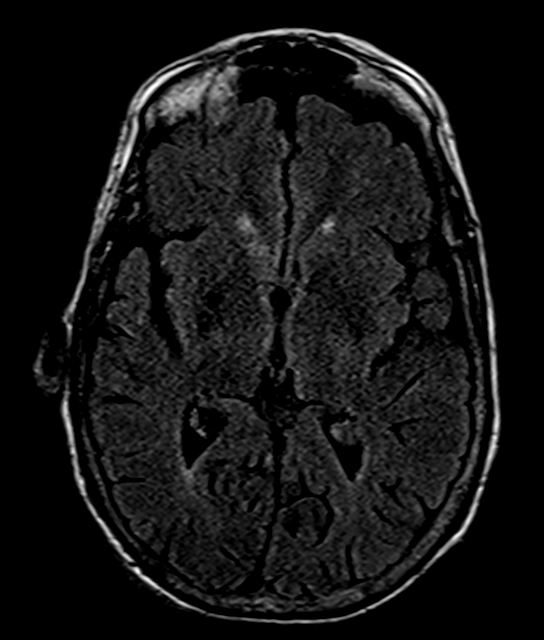
[im 35/47]
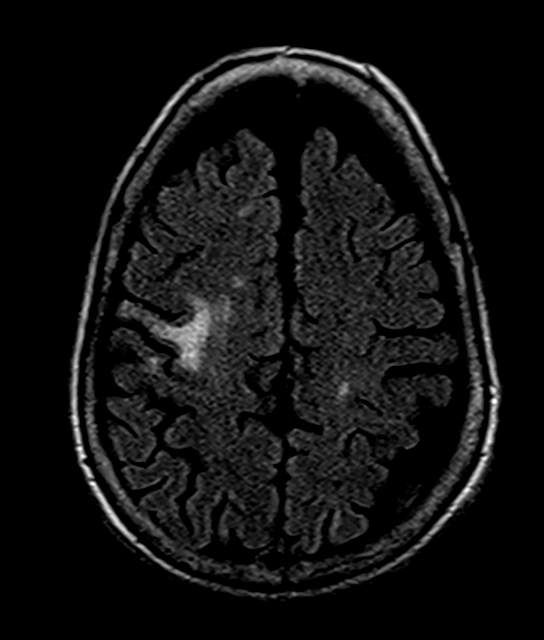
[im 47/47]
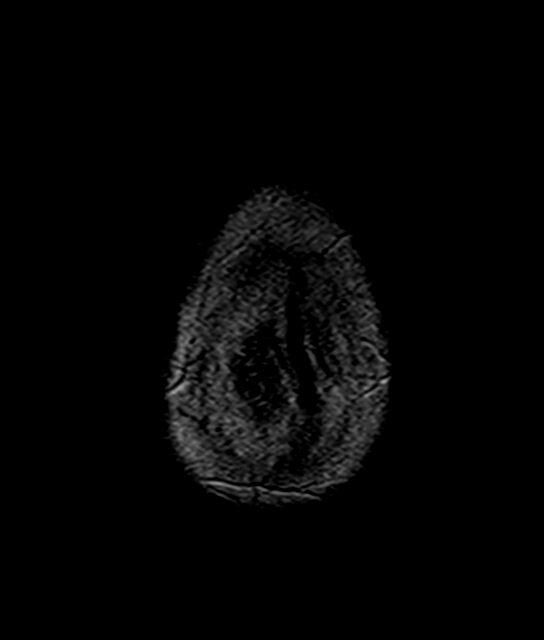

[Series 11: T2 · coronal · 5.0mm · 0.55mm/px · 3 of 28 slices shown (3 of 3)]
[im 1/28]
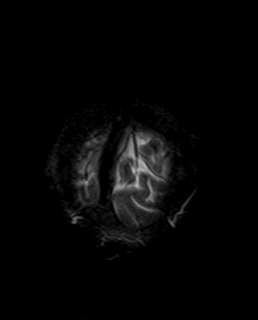
[im 14/28]
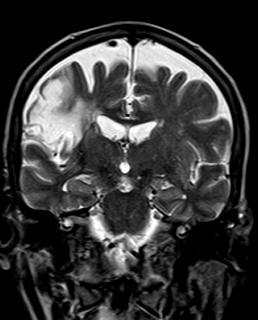
[im 28/28]
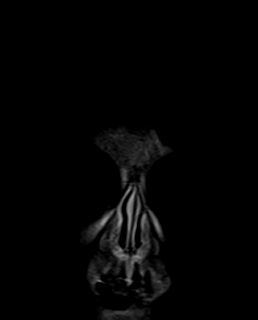

[32 of 48 positions shown; findings below may reference images not displayed]

FINDINGS: Motion artifact is present.

Brain: There are a few small foci of diffusion hyperintensity with
mild ADC hypointensity or isointensity including involvement the
left cerebellum, right aspect of the corpus callosum splenium, and
left basal ganglia region.

Chronic infarcts again identified with the largest involving right
frontoparietal lobes and insula. There are small infarcts of the
right cerebellum and central gray nuclei. There is no intracranial
mass or mass effect. There is no hydrocephalus or extra-axial fluid
collection. Additional patchy T2 hyperintensity in the
supratentorial and pontine white matter is nonspecific may reflect
mild chronic microvascular ischemic changes.

Vascular: Major vessel flow voids at the skull base are preserved.

Skull and upper cervical spine: Normal marrow signal is preserved.

Sinuses/Orbits: Paranasal sinuses are aerated. Orbits are
unremarkable.

Other: Sella is unremarkable.  Trace mastoid fluid opacification.
IMPRESSION: Few small acute to subacute infarcts involving multiple vascular
territories.

Stable chronic findings detailed above.

These results were called by telephone at the time of interpretation
on 01/03/2020 at [DATE] to provider JHEMBOY PADAM , who verbally
acknowledged these results. Patient will be sent to the emergency
department for further evaluation.

## 2020-07-26 NOTE — Telephone Encounter (Signed)
New message    Patient is on Eliquis and she keeps asking for aspirin, but then when asked if she is having chest pain she denies having chest pain.  Granddaughter was told to call the office and see if there is something we can give her in place of the aspirin ?

## 2020-07-26 NOTE — Telephone Encounter (Signed)
Would avoid aspirin while on eliquis. Looks like she is getting an EGD tomorrow, they may be a source of her chest pain detected during that test which could indicate some medication changes. For general muskuloskeletal pain tylenol would be the safest for her. If exertional chest pains then we would need to see her in [place] and discuss in more detail   Zandra Abts MD

## 2020-07-26 NOTE — Telephone Encounter (Signed)
I spoke with daughter.She reports her mother asks for aspirin when she has chest pain .She states when she comes to appointments, she denies CP and then we they get home, she asks for medication. Daughter reports mother as being stoic but she states she understands everything around her.They know she cannot take ASA with Eliquis.

## 2020-07-26 NOTE — Telephone Encounter (Signed)
I spoke with daughter who will give her mother tylenol when needed

## 2020-07-27 ENCOUNTER — Ambulatory Visit (HOSPITAL_COMMUNITY)
Admission: RE | Admit: 2020-07-27 | Discharge: 2020-07-27 | Disposition: A | Discharge status: Home / Self Care | Payer: Medicare Other | Attending: Gastroenterology | Admitting: Gastroenterology

## 2020-07-27 ENCOUNTER — Ambulatory Visit (HOSPITAL_COMMUNITY): Payer: Medicare Other | Admitting: Anesthesiology

## 2020-07-27 ENCOUNTER — Encounter (HOSPITAL_COMMUNITY): Payer: Self-pay | Admitting: Gastroenterology

## 2020-07-27 ENCOUNTER — Other Ambulatory Visit: Payer: Self-pay

## 2020-07-27 ENCOUNTER — Encounter (HOSPITAL_COMMUNITY)
Admission: RE | Disposition: A | Discharge status: Home / Self Care | Payer: Self-pay | Source: Home / Self Care | Attending: Gastroenterology

## 2020-07-27 DIAGNOSIS — K449 Diaphragmatic hernia without obstruction or gangrene: Secondary | ICD-10-CM | POA: Diagnosis not present

## 2020-07-27 DIAGNOSIS — Z888 Allergy status to other drugs, medicaments and biological substances status: Secondary | ICD-10-CM | POA: Diagnosis not present

## 2020-07-27 DIAGNOSIS — K7460 Unspecified cirrhosis of liver: Secondary | ICD-10-CM | POA: Insufficient documentation

## 2020-07-27 DIAGNOSIS — K7581 Nonalcoholic steatohepatitis (NASH): Secondary | ICD-10-CM | POA: Diagnosis not present

## 2020-07-27 DIAGNOSIS — Z7901 Long term (current) use of anticoagulants: Secondary | ICD-10-CM | POA: Insufficient documentation

## 2020-07-27 DIAGNOSIS — K2100 Gastro-esophageal reflux disease with esophagitis, without bleeding: Secondary | ICD-10-CM | POA: Insufficient documentation

## 2020-07-27 DIAGNOSIS — F17210 Nicotine dependence, cigarettes, uncomplicated: Secondary | ICD-10-CM | POA: Insufficient documentation

## 2020-07-27 DIAGNOSIS — K766 Portal hypertension: Secondary | ICD-10-CM | POA: Diagnosis not present

## 2020-07-27 DIAGNOSIS — I8510 Secondary esophageal varices without bleeding: Secondary | ICD-10-CM | POA: Diagnosis not present

## 2020-07-27 DIAGNOSIS — I8500 Esophageal varices without bleeding: Secondary | ICD-10-CM | POA: Insufficient documentation

## 2020-07-27 DIAGNOSIS — Z79899 Other long term (current) drug therapy: Secondary | ICD-10-CM | POA: Insufficient documentation

## 2020-07-27 DIAGNOSIS — I10 Essential (primary) hypertension: Secondary | ICD-10-CM | POA: Diagnosis not present

## 2020-07-27 DIAGNOSIS — K219 Gastro-esophageal reflux disease without esophagitis: Secondary | ICD-10-CM

## 2020-07-27 DIAGNOSIS — K3189 Other diseases of stomach and duodenum: Secondary | ICD-10-CM | POA: Insufficient documentation

## 2020-07-27 DIAGNOSIS — E119 Type 2 diabetes mellitus without complications: Secondary | ICD-10-CM | POA: Diagnosis not present

## 2020-07-27 HISTORY — PX: ESOPHAGOGASTRODUODENOSCOPY (EGD) WITH PROPOFOL: SHX5813

## 2020-07-27 LAB — GLUCOSE, CAPILLARY
Glucose-Capillary: 110 mg/dL — ABNORMAL HIGH (ref 70–99)
Glucose-Capillary: 82 mg/dL (ref 70–99)

## 2020-07-27 LAB — BASIC METABOLIC PANEL
Anion gap: 7 (ref 5–15)
BUN: 13 mg/dL (ref 8–23)
CO2: 27 mmol/L (ref 22–32)
Calcium: 8.5 mg/dL — ABNORMAL LOW (ref 8.9–10.3)
Chloride: 108 mmol/L (ref 98–111)
Creatinine, Ser: 0.68 mg/dL (ref 0.44–1.00)
GFR, Estimated: >60 mL/min (ref >60)
Glucose, Bld: 95 mg/dL (ref 70–99)
Potassium: 3.5 mmol/L (ref 3.5–5.1)
Sodium: 142 mmol/L (ref 135–145)

## 2020-07-27 LAB — SARS CORONAVIRUS 2 (TAT 6-24 HRS): SARS Coronavirus 2: NEGATIVE

## 2020-07-27 SURGERY — ESOPHAGOGASTRODUODENOSCOPY (EGD) WITH PROPOFOL
Anesthesia: General

## 2020-07-27 MED ORDER — PROPOFOL 10 MG/ML IV BOLUS
INTRAVENOUS | Status: DC | PRN
  Administered 2020-07-27: 100 mg via INTRAVENOUS

## 2020-07-27 MED ORDER — LACTATED RINGERS IV SOLN: INTRAVENOUS | Status: DC | PRN

## 2020-07-27 MED ORDER — LIDOCAINE HCL (CARDIAC) PF 100 MG/5ML IV SOSY
PREFILLED_SYRINGE | INTRAVENOUS | Status: DC | PRN
  Administered 2020-07-27: 50 mg via INTRAVENOUS

## 2020-07-27 MED ORDER — PROPOFOL 500 MG/50ML IV EMUL
INTRAVENOUS | Status: DC | PRN
  Administered 2020-07-27: 100 ug/kg/min via INTRAVENOUS

## 2020-07-27 MED ORDER — OMEPRAZOLE 20 MG PO CPDR: 40.0000 mg | DELAYED_RELEASE_CAPSULE | Freq: Every day | ORAL | 3 refills | Status: DC

## 2020-07-27 MED ORDER — CARVEDILOL 3.125 MG PO TABS: 6.2500 mg | ORAL_TABLET | Freq: Two times a day (BID) | ORAL | 5 refills | Status: DC

## 2020-07-27 MED ORDER — LACTATED RINGERS IV SOLN: Freq: Once | INTRAVENOUS | Status: DC

## 2020-07-27 NOTE — Anesthesia Preprocedure Evaluation (Addendum)
Anesthesia Evaluation  Patient identified by MRN, date of birth, ID band Patient awake    Reviewed: Allergy & Precautions, NPO status , Patient's Chart, lab work & pertinent test results  Airway Mallampati: II  TM Distance: >3 FB Neck ROM: Full    Dental  (+) Dental Advisory Given, Teeth Intact   Pulmonary sleep apnea ,    breath sounds clear to auscultation       Cardiovascular Exercise Tolerance: Poor hypertension, Pt. on medications + CAD, + Past MI and + Cardiac Stents  Normal cardiovascular exam+ dysrhythmias Supra Ventricular Tachycardia + Valvular Problems/Murmurs  Rhythm:Regular Rate:Normal  1. Left ventricular ejection fraction, by estimation, is 60 to 65%. The  left ventricle has normal function. The left ventricle has no regional  wall motion abnormalities. There is mild left ventricular hypertrophy.  Left ventricular diastolic parameters  are consistent with Grade II diastolic dysfunction (pseudonormalization).  Elevated left atrial pressure.  2. Right ventricular systolic function is normal. The right ventricular  size is normal. There is mildly elevated pulmonary artery systolic  pressure.  3. Left atrial size was severely dilated.  4. The mitral valve was not well visualized. Mild mitral valve  regurgitation. Moderate mitral stenosis.MV peak gradient, 13.4 mmHg. The  mean mitral valve gradient is 5.0 mmHg.  5. The aortic valve was not well visualized. Aortic valve regurgitation  is moderate. Moderate aortic valve stenosis.  6. The inferior vena cava is normal in size with greater than 50%  respiratory variability, suggesting right atrial pressure of 3 mmHg.    Neuro/Psych Seizures -, Well Controlled,  TIACVA    GI/Hepatic hiatal hernia, GERD  ,(+) Hepatitis -  Endo/Other  diabetes  Renal/GU      Musculoskeletal  (+) Arthritis ,   Abdominal   Peds  Hematology  (+) anemia ,   Anesthesia Other  Findings   Reproductive/Obstetrics                           Anesthesia Physical Anesthesia Plan  ASA: III  Anesthesia Plan: General   Post-op Pain Management:    Induction: Intravenous  PONV Risk Score and Plan: TIVA  Airway Management Planned: Nasal Cannula and Natural Airway  Additional Equipment:   Intra-op Plan:   Post-operative Plan:   Informed Consent: I have reviewed the patients History and Physical, chart, labs and discussed the procedure including the risks, benefits and alternatives for the proposed anesthesia with the patient or authorized representative who has indicated his/her understanding and acceptance.     Dental advisory given  Plan Discussed with: CRNA and Surgeon  Anesthesia Plan Comments:         Anesthesia Quick Evaluation

## 2020-07-27 NOTE — Anesthesia Procedure Notes (Signed)
Date/Time: 07/27/2020 12:05 PM Performed by: Orlie Dakin, CRNA Pre-anesthesia Checklist: Patient identified, Emergency Drugs available, Suction available and Patient being monitored Patient Re-evaluated:Patient Re-evaluated prior to induction Oxygen Delivery Method: Nasal cannula Induction Type: IV induction Placement Confirmation: positive ETCO2

## 2020-07-27 NOTE — Op Note (Signed)
[facility] Patient Name: [name] Procedure Date: 07/27/2020 11:48 AM MRN: [id number] Date of Birth: Jan 28, 1941 Attending MD: Maylon Peppers ,  CSN: [id number] Age: 79 Admit Type: Outpatient Procedure:                Upper GI endoscopy Indications:              Follow-up of esophageal varices Providers:                Maylon Peppers, Janeece Riggers, RN, Kristine L.                            Risa Grill, Technician Referring MD:              Medicines:                Monitored Anesthesia Care Complications:            No immediate complications. Estimated Blood Loss:     Estimated blood loss: none. Procedure:                Pre-Anesthesia Assessment:                           - Prior to the procedure, a History and Physical                            was performed, and patient medications, allergies                            and sensitivities were reviewed. The patient's                            tolerance of previous anesthesia was reviewed.                           - The risks and benefits of the procedure and the                            sedation options and risks were discussed with the                            patient. All questions were answered and informed                            consent was obtained.                           - ASA Grade Assessment: III - A patient with severe                            systemic disease.                           After obtaining informed consent, the endoscope was                            passed under direct vision. Throughout the  procedure, the patient's blood pressure, pulse, and                            oxygen saturations were monitored continuously. The                            [id number]) was introduced through the mouth,                            and advanced to the second part of duodenum. The                            upper GI endoscopy was accomplished without                             difficulty. The patient tolerated the procedure                            well. Scope In: 78:93:81 PM Scope Out: 12:14:17 PM Total Procedure Duration: 0 hours 4 minutes 59 seconds  Findings:      LA Grade A (one or more mucosal breaks less than 5 mm, not extending       between tops of 2 mucosal folds) esophagitis with no bleeding was found       in the lower third of the esophagus.      A 2 cm hiatal hernia was present.      One column of grade I varices with no bleeding and no stigmata of recent       bleeding were found in the lower third of the esophagus. No red wale       signs were present. Stigmata of prior treatment were evident.      Mild portal hypertensive gastropathy was found in the gastric body.      The examined duodenum was normal. Impression:               - LA Grade A reflux esophagitis with no bleeding.                           - 2 cm hiatal hernia.                           - Grade I esophageal varices with no bleeding and                            no stigmata of recent bleeding.                           - Portal hypertensive gastropathy.                           - Normal examined duodenum.                           - No specimens collected. Moderate Sedation:      Per Anesthesia Care Recommendation:           - Discharge patient to  home (ambulatory).                           - Resume previous diet.                           - Repeat upper endoscopy in 1 year for surveillance.                           - Increase carvedilol to 6.25 g twice a day.                           - Decrease omeprazole to 40 mg qday. Procedure Code(s):        --- Professional ---                           [phone], GC, Esophagogastroduodenoscopy, flexible,                            transoral; diagnostic, including collection of                            specimen(s) by brushing or washing, when performed                            (separate procedure) Diagnosis  Code(s):        --- Professional ---                           I85.00, Esophageal varices without bleeding                           K76.6, Portal hypertension                           K31.89, Other diseases of stomach and duodenum CPT copyright 2019 American Medical Association. All rights reserved. The codes documented in this report are preliminary and upon coder review may  be revised to meet current compliance requirements. Maylon Peppers, MD Maylon Peppers,  07/27/2020 12:22:40 PM This report has been signed electronically. Number of Addenda: 0

## 2020-07-27 NOTE — H&P (Signed)
[name] is an 79 y.o. female.   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  Patient has been doing fine, denies having any complaints at the moment.  She reports that she has been taking her medications compliantly.  Denies having any melena, hematochezia, hematemesis, nausea, vomiting, abdominal pain or distention.  Her last EGD was performed on 06/15/2020, had banding of 1, varices x1 and started on Coreg at that time.  Coming today for surveillance EGD  Past Medical History:  Diagnosis Date  . Anemia   . Aortic insufficiency    Moderate  . Back pain   . Carotid stenosis, bilateral   . Cirrhosis ([place])   . Closed left femoral fracture ([place]) 04/21/2017  . Coronary artery disease    Stent x 2 RCA 1995, cardiac catheterization 09/2016 showing only mild atherosclerosis  . DDD (degenerative disc disease), lumbar   . Diabetes mellitus without complication ([place])   . Essential hypertension   . Falls   . GERD (gastroesophageal reflux disease)   . Hiatal hernia   . History of stroke   . Hypercholesteremia   . IBS (irritable bowel syndrome)   . Non-alcoholic fatty liver disease   . OSA (obstructive sleep apnea)    no CPAP  . Pericardial effusion    a. small by echo 2018.  Marland Kitchen PSVT (paroxysmal supraventricular tachycardia) ([place])   . Recurrent UTI   . Seizures ([place]) 2018  . Stroke ([place]) 07/2019   several strokes within the last 10 years  . Swallowing difficulty   . Varices, esophageal ([place])     Past Surgical History:  Procedure Laterality Date  . [place]  . APPENDECTOMY    . BACK SURGERY  1985  . CARDIAC CATHETERIZATION  [id number]   Stent to the proximal RCA after MI   . CARDIAC CATHETERIZATION N/A 09/26/2016   Procedure: Left Heart Cath and Coronary  Angiography;  Surgeon: Leonie Man, MD;  Location: [facility];  Service: Cardiovascular;  Laterality: N/A;  . CATARACT EXTRACTION W/PHACO Right 07/04/2014   Procedure: CATARACT EXTRACTION PHACO AND INTRAOCULAR LENS PLACEMENT (IOC);  Surgeon: Elta Guadeloupe T. Gershon Crane, MD;  Location: [facility];  Service: Ophthalmology;  Laterality: Right;  CDE:13.13  . COLONOSCOPY    . DILATION AND CURETTAGE OF UTERUS     x2  . Epi Retinal Membrane Peel Left   . ERCP    . ESOPHAGEAL BANDING N/A 04/01/2013   Procedure: ESOPHAGEAL BANDING;  Surgeon: Rogene Houston, MD;  [facility];  Service: Endoscopy;  Laterality: N/A;  . ESOPHAGEAL BANDING N/A 05/24/2013   Procedure: ESOPHAGEAL BANDING;  Surgeon: Rogene Houston, MD;  [facility];  Service: Endoscopy;  Laterality: N/A;  . ESOPHAGEAL BANDING N/A 06/21/2014   Procedure: ESOPHAGEAL BANDING;  Surgeon: Rogene Houston, MD;  [facility];  Service: Endoscopy;  Laterality: N/A;  . ESOPHAGEAL BANDING  06/15/2020   Procedure: ESOPHAGEAL BANDING;  Surgeon: Harvel Quale, MD;  [facility];  Service: Gastroenterology;;  . ESOPHAGOGASTRODUODENOSCOPY N/A 04/01/2013   Procedure: ESOPHAGOGASTRODUODENOSCOPY (EGD);  Surgeon: Rogene Houston, MD;  [facility];  Service: Endoscopy;  Laterality: N/A;  230-rescheduled to 8:30am Ann notified pt  . ESOPHAGOGASTRODUODENOSCOPY N/A 05/24/2013   Procedure: ESOPHAGOGASTRODUODENOSCOPY (EGD);  Surgeon: Rogene Houston, MD;  [facility]  [facility];  Service: Endoscopy;  Laterality: N/A;  730  . ESOPHAGOGASTRODUODENOSCOPY N/A 06/21/2014   Procedure: ESOPHAGOGASTRODUODENOSCOPY (EGD);  Surgeon: Rogene Houston, MD;  [facility];  Service: Endoscopy;  Laterality: N/A;  930-rescheduled 10/14 @ 1200 Ann to notify pt  . ESOPHAGOGASTRODUODENOSCOPY (EGD) WITH PROPOFOL N/A 06/15/2020   Procedure: ESOPHAGOGASTRODUODENOSCOPY (EGD) WITH PROPOFOL;  Surgeon: Harvel Quale, MD;  [facility];  Service: Gastroenterology;  Laterality: N/A;  230  . EYE SURGERY  08   cataract surgery of the left eye  . FEMUR IM NAIL Left 03/02/2017   Procedure: INTRAMEDULLARY (IM) NAIL FEMORAL;  Surgeon: Rod Can, MD;  Location: [place];  Service: Orthopedics;  Laterality: Left;  . HARDWARE REMOVAL Right 01/17/2013   Procedure: REMOVAL OF HARDWARE AND EXCISION ULNAR STYLOID RIGHT WRIST;  Surgeon: Tennis Must, MD;  Location: [facility];  Service: Orthopedics;  Laterality: Right;  . implanted heart monitor  02/2020   to monitor Afib  . MYRINGOTOMY  2012   both ears  . ORIF FEMUR FRACTURE Left 04/23/2017   Procedure: OPEN REDUCTION INTERNAL FIXATION (ORIF) DISTAL FEMUR FRACTURE (FRACTURE AROUND FEMORAL NAIL);  Surgeon: Altamese Kake, MD;  Location: [place];  Service: Orthopedics;  Laterality: Left;  . TONSILLECTOMY    . TUBAL LIGATION    . VAGINAL HYSTERECTOMY  1972  . WRIST SURGERY     rt wrist hardwear removal    Family History  Problem Relation Age of Onset  . Diabetes Mother   . Heart failure Father   . Heart failure Maternal Aunt    Social History:  reports that she has been smoking cigarettes. She has a 12.50 pack-year smoking history. She has never used smokeless tobacco. She reports that she does not drink alcohol and does not use drugs.  Allergies:  Allergies  Allergen Reactions  . Tape Rash and Other (See Comments)    Please use paper tape    Medications Prior to Admission  Medication Sig Dispense Refill  . atorvastatin (LIPITOR) 80 MG tablet Take 1 tablet (80 mg total) by mouth daily. 30 tablet 1  . calcium citrate (CALCITRATE - DOSED IN MG ELEMENTAL CALCIUM) 950 MG tablet Take 1 tablet (200 mg of elemental calcium total) by mouth daily. (Patient taking differently: Take 0.5 tablets by mouth daily. ) 30 tablet 1  . carvedilol (COREG) 3.125 MG tablet Take 1 tablet (3.125 mg total) by mouth 2 (two) times daily. 60 tablet 5   . diltiazem (CARDIZEM CD) 120 MG 24 hr capsule TAKE (1) CAPSULE BY MOUTH ONCE DAILY. (Patient taking differently: Take 120 mg by mouth daily. ) 30 capsule 6  . docusate sodium (COLACE) 100 MG capsule Take 100 mg by mouth daily as needed for moderate constipation.     Marland Kitchen ELIQUIS 5 MG TABS tablet TAKE (1) TABLET BY MOUTH TWICE DAILY. (Patient taking differently: Take 5 mg by mouth 2 (two) times daily. ) 60 tablet 6  . fluticasone (FLONASE) 50 MCG/ACT nasal spray Place 2 sprays into both nostrils daily as needed for allergies.     . furosemide (LASIX) 20 MG tablet Take 20 mg by mouth daily.    . furosemide (LASIX) 40 MG tablet Take 1 tablet (40 mg total) by mouth daily. May take additional 20 mg for severe swelling (Patient taking differently: Take 40 mg by mouth daily. ) 120 tablet 3  . methocarbamol (ROBAXIN) 500 MG tablet Take 500 mg by mouth 3 (three)  times daily as needed for muscle spasms.    . Multiple Vitamins-Minerals (MULTIVITAMIN GUMMIES ADULT PO) Take 2 tablets by mouth daily.    . niacin (NIASPAN) 500 MG CR tablet Take 500 mg by mouth daily.     Marland Kitchen omeprazole (PRILOSEC) 20 MG capsule Take 2 capsules (40 mg total) by mouth 2 (two) times daily before a meal for 15 days. (Patient taking differently: Take 20 mg by mouth daily before breakfast. ) 30 capsule 0  . ondansetron (ZOFRAN) 4 MG tablet Take 4 mg by mouth every 8 (eight) hours as needed for nausea or vomiting.    . potassium chloride SA (KLOR-CON) 20 MEQ tablet Take 20 mEq by mouth daily.    Marland Kitchen Propylene Glycol (SYSTANE COMPLETE OP) Place 1 drop into both eyes in the morning and at bedtime.    Marland Kitchen acetaminophen (TYLENOL) 500 MG tablet Take 1,000 mg by mouth every 6 (six) hours as needed for moderate pain.  (Patient not taking: Reported on 07/20/2020)    . Coenzyme Q10 100 MG capsule Take 100 mg by mouth daily.  (Patient not taking: Reported on 07/20/2020)    . HYDROcodone-acetaminophen (NORCO) 10-325 MG tablet Take 1 tablet by mouth as  needed for pain.  (Patient not taking: Reported on 07/20/2020)    . mirtazapine (REMERON) 30 MG tablet Take 30 mg by mouth at bedtime.  (Patient not taking: Reported on 07/20/2020)    . potassium chloride 20 MEQ/15ML (10%) SOLN Take 20 mEq by mouth daily. (Patient not taking: Reported on 07/20/2020)      Results for orders placed or performed during the hospital encounter of 07/27/20 (from the past 48 hour(s))  Glucose, capillary     Status: Abnormal   Collection Time: 07/27/20 11:27 AM  Result Value Ref Range   Glucose-Capillary 110 (H) 70 - 99 mg/dL    Comment: Glucose reference range applies only to samples taken after fasting for at least 8 hours.   No results found.  Review of Systems  Constitutional: Negative.   HENT: Negative.   Eyes: Negative.   Respiratory: Negative.   Cardiovascular: Negative.   Gastrointestinal: Negative.   Endocrine: Negative.   Genitourinary: Negative.   Musculoskeletal: Negative.   Skin: Negative.   Allergic/Immunologic: Negative.   Neurological: Negative.   Hematological: Negative.   Psychiatric/Behavioral: Negative.     Blood pressure (!) 145/59, pulse 67, temperature 98 F (36.7 C), temperature source Oral, resp. rate 16, height 5' 3"  (1.6 m), weight 63.5 kg, SpO2 95 %. Physical Exam  GENERAL: The patient is AO x3, in no acute distress. Obese. HEENT: Head is normocephalic and atraumatic. EOMI are intact. Mouth is well hydrated and without lesions. NECK: Supple. No masses LUNGS: Clear to auscultation. No presence of rhonchi/wheezing/rales. Adequate chest expansion HEART: RRR, normal s1 and s2. ABDOMEN: Soft, nontender, no guarding, no peritoneal signs, and nondistended. BS +. No masses. EXTREMITIES: Without any cyanosis, clubbing, rash, lesions or edema. NEUROLOGIC: AOx3, no focal motor deficit. SKIN: no jaundice, no rashes  Assessment/Plan 79 year old female with past medical history of NASH cirrhosis complicated by esophageal variceal  bleeding requiring banding, coming to the hospital for surveillance of esophageal varices.  We will proceed with EGD  Harvel Quale, MD 07/27/2020, 12:02 PM

## 2020-07-27 NOTE — Discharge Instructions (Signed)
You are being discharged to home.  Resume your previous diet.  Your physician has recommended a repeat upper endoscopy in one year for surveillance.  Increase carvedilol to 6.25 g twice a day. Decrease omeprazole to 40 mg qday.       Upper Endoscopy, Adult, Care After This sheet gives you information about how to care for yourself after your procedure. Your health care provider may also give you more specific instructions. If you have problems or questions, contact your health care provider. What can I expect after the procedure? After the procedure, it is common to have:  A sore throat.  Mild stomach pain or discomfort.  Bloating.  Nausea. Follow these instructions at home:   Follow instructions from your health care provider about what to eat or drink after your procedure.  Return to your normal activities as told by your health care provider. Ask your health care provider what activities are safe for you.  Take over-the-counter and prescription medicines only as told by your health care provider.  Do not drive for 24 hours if you were given a sedative during your procedure.  Keep all follow-up visits as told by your health care provider. This is important. Contact a health care provider if you have:  A sore throat that lasts longer than one day.  Trouble swallowing. Get help right away if:  You vomit blood or your vomit looks like coffee grounds.  You have: ? A fever. ? Bloody, black, or tarry stools. ? A severe sore throat or you cannot swallow. ? Difficulty breathing. ? Severe pain in your chest or abdomen. Summary  After the procedure, it is common to have a sore throat, mild stomach discomfort, bloating, and nausea.  Do not drive for 24 hours if you were given a sedative during the procedure.  Follow instructions from your health care provider about what to eat or drink after your procedure.  Return to your normal activities as told by your health care  provider. This information is not intended to replace advice given to you by your health care provider. Make sure you discuss any questions you have with your health care provider. Document Revised: 02/16/2018 Document Reviewed: 01/25/2018 Elsevier Patient Education  2020 [place] After These instructions provide you with information about caring for yourself after your procedure. Your health care provider may also give you more specific instructions. Your treatment has been planned according to current medical practices, but problems sometimes occur. Call your health care provider if you have any problems or questions after your procedure. What can I expect after the procedure? After your procedure, you may:  Feel sleepy for several hours.  Feel clumsy and have poor balance for several hours.  Feel forgetful about what happened after the procedure.  Have poor judgment for several hours.  Feel nauseous or vomit.  Have a sore throat if you had a breathing tube during the procedure. Follow these instructions at home: For at least 24 hours after the procedure:      Have a responsible adult stay with you. It is important to have someone help care for you until you are awake and alert.  Rest as needed.  Do not: ? Participate in activities in which you could fall or become injured. ? Drive. ? Use heavy machinery. ? Drink alcohol. ? Take sleeping pills or medicines that cause drowsiness. ? Make important decisions or sign legal documents. ? Take care of  children on your own. Eating and drinking  Follow the diet that is recommended by your health care provider.  If you vomit, drink water, juice, or soup when you can drink without vomiting.  Make sure you have little or no nausea before eating solid foods. General instructions  Take over-the-counter and prescription medicines only as told by your health care provider.  If you have  sleep apnea, surgery and certain medicines can increase your risk for breathing problems. Follow instructions from your health care provider about wearing your sleep device: ? Anytime you are sleeping, including during daytime naps. ? While taking prescription pain medicines, sleeping medicines, or medicines that make you drowsy.  If you smoke, do not smoke without supervision.  Keep all follow-up visits as told by your health care provider. This is important. Contact a health care provider if:  You keep feeling nauseous or you keep vomiting.  You feel light-headed.  You develop a rash.  You have a fever. Get help right away if:  You have trouble breathing. Summary  For several hours after your procedure, you may feel sleepy and have poor judgment.  Have a responsible adult stay with you for at least 24 hours or until you are awake and alert. This information is not intended to replace advice given to you by your health care provider. Make sure you discuss any questions you have with your health care provider. Document Revised: 11/23/2017 Document Reviewed: 12/16/2015 Elsevier Patient Education  [place].

## 2020-07-27 NOTE — Transfer of Care (Signed)
Immediate Anesthesia Transfer of Care Note  Patient: [facility]  Procedure(s) Performed: ESOPHAGOGASTRODUODENOSCOPY (EGD) WITH PROPOFOL (N/A )  Patient Location: PACU  Anesthesia Type:General  Level of Consciousness: drowsy  Airway & Oxygen Therapy: Patient Spontanous Breathing  Post-op Assessment: Report given to RN and Post -op Vital signs reviewed and stable  Post vital signs: Reviewed and stable  Last Vitals:  Vitals Value Taken Time  BP 96/32 07/27/20 1218  Temp    Pulse 68 07/27/20 1220  Resp 15 07/27/20 1220  SpO2 98 % 07/27/20 1220  Vitals shown include unvalidated device data.  Last Pain:  Vitals:   07/27/20 1136  TempSrc: Oral  PainSc: 0-No pain      Patients Stated Pain Goal: 5 (31/59/45 8592)  Complications: No complications documented.

## 2020-07-27 NOTE — Anesthesia Postprocedure Evaluation (Signed)
Anesthesia Post Note  Patient: [facility]  Procedure(s) Performed: ESOPHAGOGASTRODUODENOSCOPY (EGD) WITH PROPOFOL (N/A )  Patient location during evaluation: PACU Anesthesia Type: General Level of consciousness: awake and alert and oriented Pain management: pain level controlled Vital Signs Assessment: post-procedure vital signs reviewed and stable Respiratory status: spontaneous breathing, nonlabored ventilation and respiratory function stable Cardiovascular status: blood pressure returned to baseline and stable Postop Assessment: no apparent nausea or vomiting Anesthetic complications: no   No complications documented.   Last Vitals:  Vitals:   07/27/20 1136  BP: (!) 145/59  Pulse: 67  Resp: 16  Temp: 36.7 C  SpO2: 95%    Last Pain:  Vitals:   07/27/20 1136  TempSrc: Oral  PainSc: 0-No pain                 Orlie Dakin

## 2020-08-01 ENCOUNTER — Encounter (HOSPITAL_COMMUNITY): Payer: Self-pay | Admitting: Gastroenterology

## 2020-08-01 DIAGNOSIS — R1310 Dysphagia, unspecified: Secondary | ICD-10-CM | POA: Diagnosis not present

## 2020-08-01 DIAGNOSIS — M6281 Muscle weakness (generalized): Secondary | ICD-10-CM | POA: Diagnosis not present

## 2020-08-01 DIAGNOSIS — I2510 Atherosclerotic heart disease of native coronary artery without angina pectoris: Secondary | ICD-10-CM | POA: Diagnosis not present

## 2020-08-01 DIAGNOSIS — G4709 Other insomnia: Secondary | ICD-10-CM | POA: Diagnosis not present

## 2020-08-01 DIAGNOSIS — Z8673 Personal history of transient ischemic attack (TIA), and cerebral infarction without residual deficits: Secondary | ICD-10-CM | POA: Diagnosis not present

## 2020-08-01 DIAGNOSIS — R2689 Other abnormalities of gait and mobility: Secondary | ICD-10-CM | POA: Diagnosis not present

## 2020-08-01 DIAGNOSIS — I10 Essential (primary) hypertension: Secondary | ICD-10-CM | POA: Diagnosis not present

## 2020-08-01 IMAGING — DX DG FACIAL BONES COMPLETE 3+V
6 series · 6 of 6 positions shown · non-contrast
Comparison: None.

CLINICAL DATA: 78-year-old female with trauma to the left side of
the face.

EXAM:
FACIAL BONES COMPLETE 3+V

[facial waters (1 of 2)]
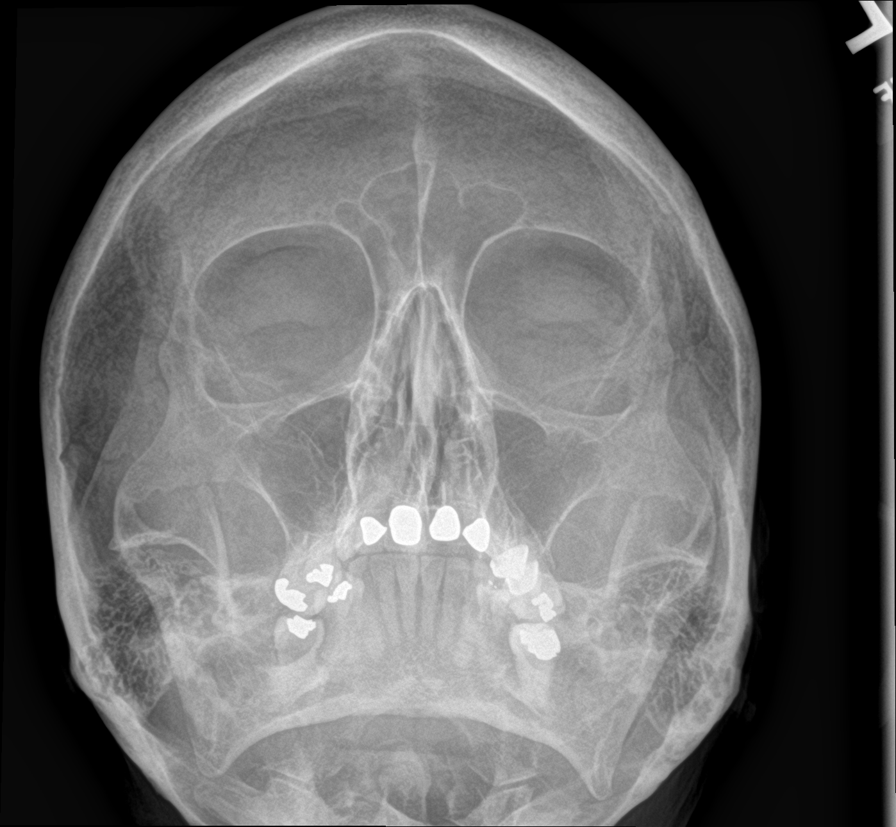

[mandible townes]
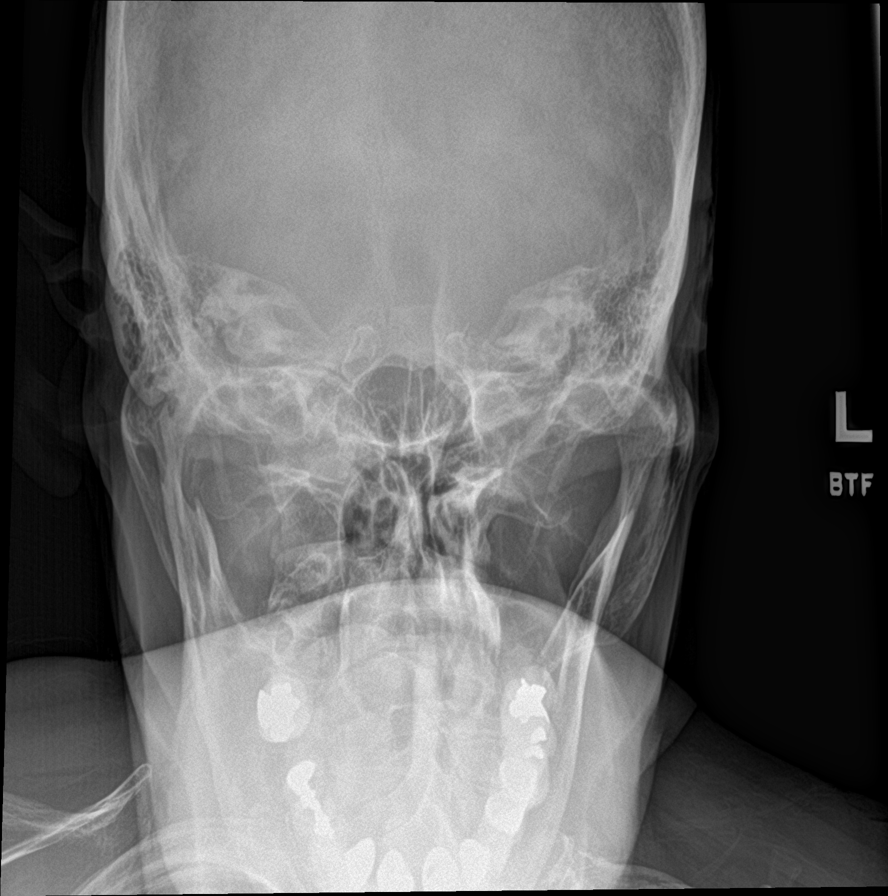

[facial lateral]
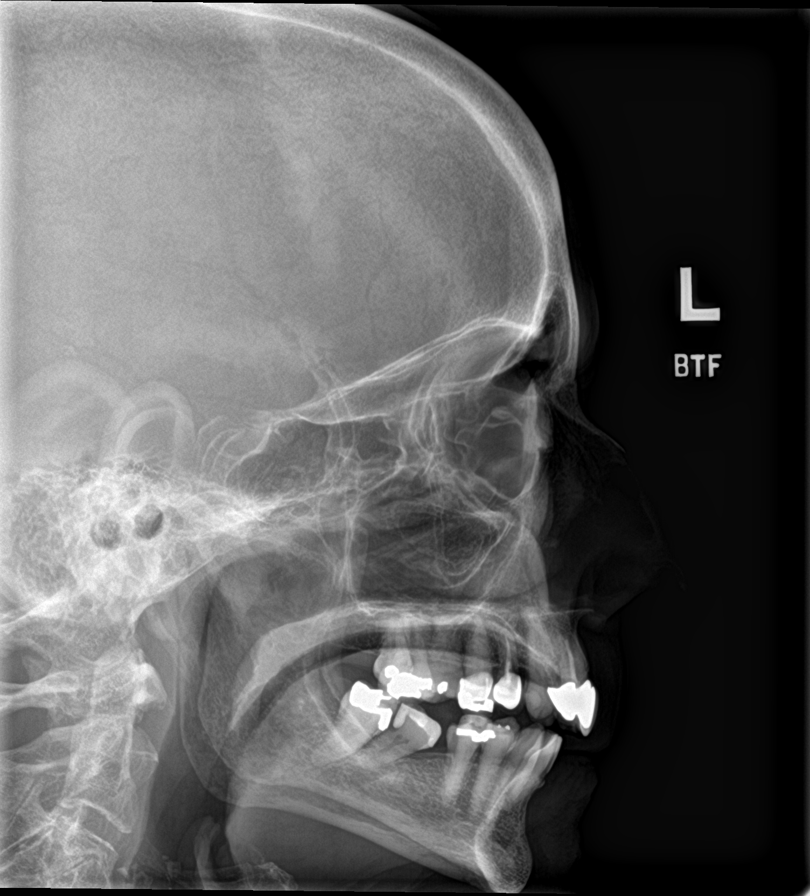

[facial smv (1 of 2)]
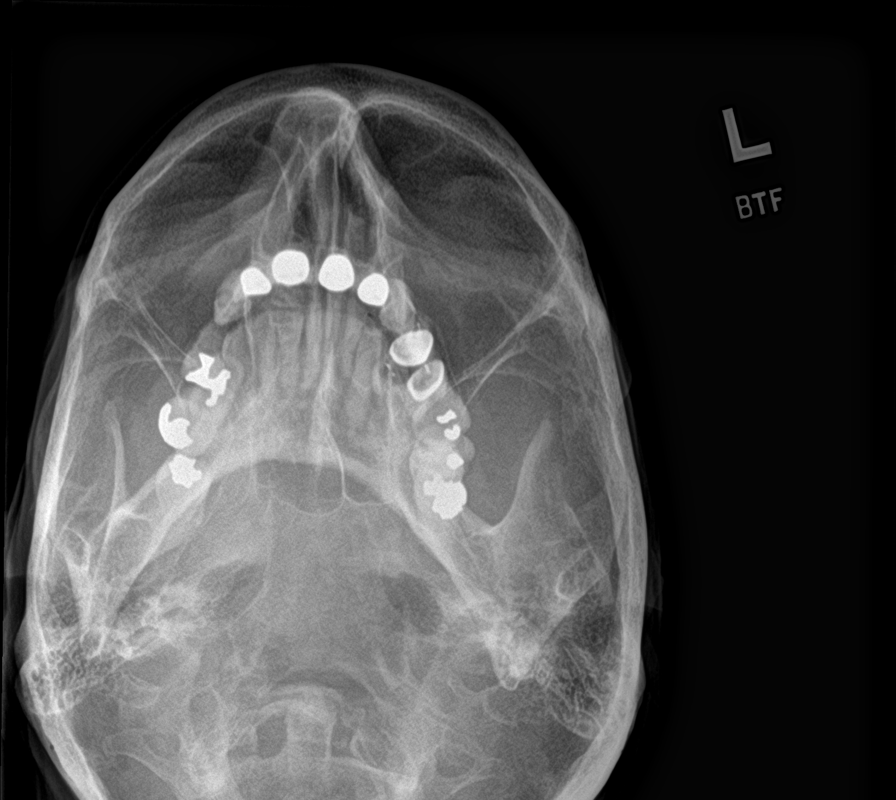

[facial waters (2 of 2)]
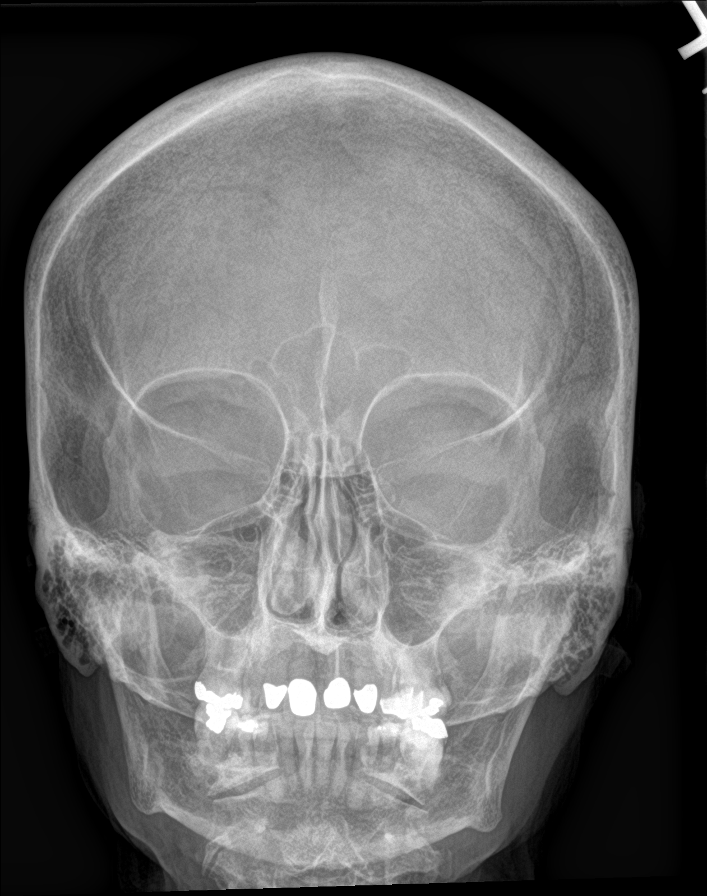

[facial smv (2 of 2)]
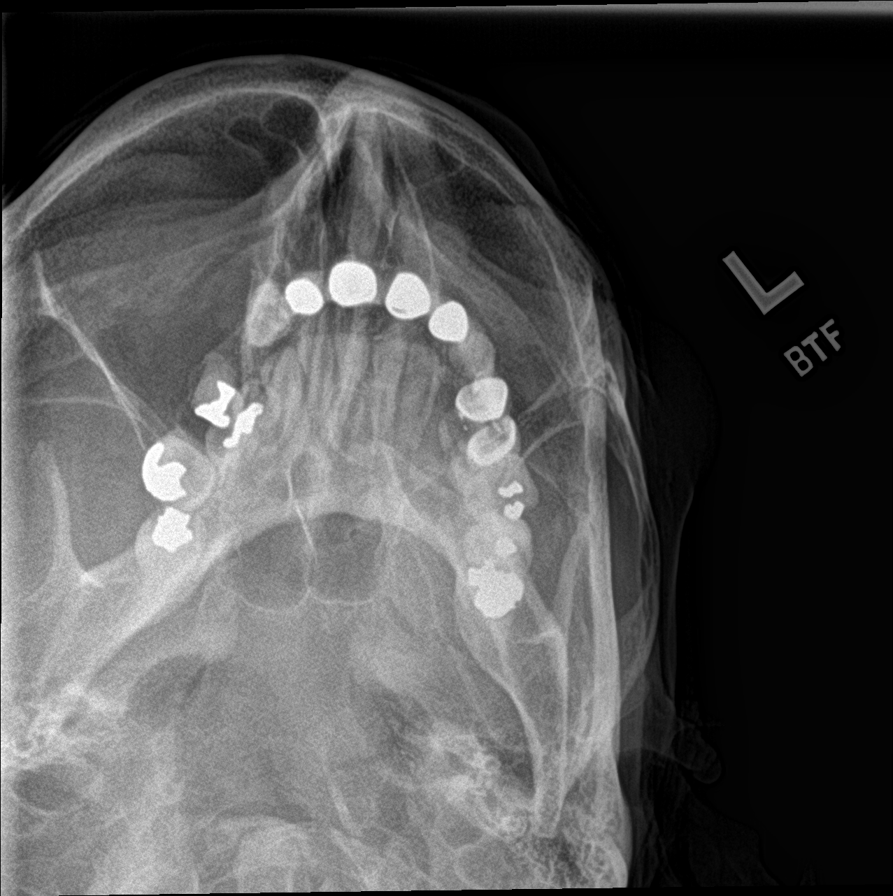

[6 of 6 positions shown; findings below may reference images not displayed]

FINDINGS: There is no evidence of fracture or other significant bone
abnormality. No orbital emphysema or sinus air-fluid levels are
seen.
IMPRESSION: Negative.

## 2020-08-07 DIAGNOSIS — I10 Essential (primary) hypertension: Secondary | ICD-10-CM | POA: Diagnosis not present

## 2020-08-07 DIAGNOSIS — Z8673 Personal history of transient ischemic attack (TIA), and cerebral infarction without residual deficits: Secondary | ICD-10-CM | POA: Diagnosis not present

## 2020-08-07 DIAGNOSIS — G4709 Other insomnia: Secondary | ICD-10-CM | POA: Diagnosis not present

## 2020-08-07 DIAGNOSIS — R1310 Dysphagia, unspecified: Secondary | ICD-10-CM | POA: Diagnosis not present

## 2020-08-07 DIAGNOSIS — I2510 Atherosclerotic heart disease of native coronary artery without angina pectoris: Secondary | ICD-10-CM | POA: Diagnosis not present

## 2020-08-07 DIAGNOSIS — M6281 Muscle weakness (generalized): Secondary | ICD-10-CM | POA: Diagnosis not present

## 2020-08-07 DIAGNOSIS — R2689 Other abnormalities of gait and mobility: Secondary | ICD-10-CM | POA: Diagnosis not present

## 2020-08-08 DIAGNOSIS — I10 Essential (primary) hypertension: Secondary | ICD-10-CM | POA: Diagnosis not present

## 2020-08-08 DIAGNOSIS — M6281 Muscle weakness (generalized): Secondary | ICD-10-CM | POA: Diagnosis not present

## 2020-08-08 DIAGNOSIS — R2689 Other abnormalities of gait and mobility: Secondary | ICD-10-CM | POA: Diagnosis not present

## 2020-08-08 DIAGNOSIS — R1310 Dysphagia, unspecified: Secondary | ICD-10-CM | POA: Diagnosis not present

## 2020-08-10 DIAGNOSIS — I2510 Atherosclerotic heart disease of native coronary artery without angina pectoris: Secondary | ICD-10-CM | POA: Diagnosis not present

## 2020-08-10 DIAGNOSIS — Z8673 Personal history of transient ischemic attack (TIA), and cerebral infarction without residual deficits: Secondary | ICD-10-CM | POA: Diagnosis not present

## 2020-08-10 DIAGNOSIS — G4709 Other insomnia: Secondary | ICD-10-CM | POA: Diagnosis not present

## 2020-08-10 DIAGNOSIS — I10 Essential (primary) hypertension: Secondary | ICD-10-CM | POA: Diagnosis not present

## 2020-08-10 DIAGNOSIS — M6281 Muscle weakness (generalized): Secondary | ICD-10-CM | POA: Diagnosis not present

## 2020-08-10 DIAGNOSIS — R2689 Other abnormalities of gait and mobility: Secondary | ICD-10-CM | POA: Diagnosis not present

## 2020-08-10 DIAGNOSIS — R1310 Dysphagia, unspecified: Secondary | ICD-10-CM | POA: Diagnosis not present

## 2020-08-10 IMAGING — CT CT HEAD W/O CM
3 series · 15 of 47 positions shown, 18 images · non-contrast
Comparison: CT angiogram head/neck 01/03/2020, brain MRI
01/03/2020.

CLINICAL DATA: Headache, acute, normal neuro exam. Additional
provided: Patient reports blurred vision, hand tingling, "floaters"
in left eye intermittent since yesterday.

EXAM:
CT HEAD WITHOUT CONTRAST
TECHNIQUE: Contiguous axial images were obtained from the base of the skull
through the vertex without intravenous contrast.

[Series 2: head w o · axial · 0.42mm/px · z∈[-15,+110]mm · 9 of 30 slices shown, 12 images]
[im 3/30  brain]
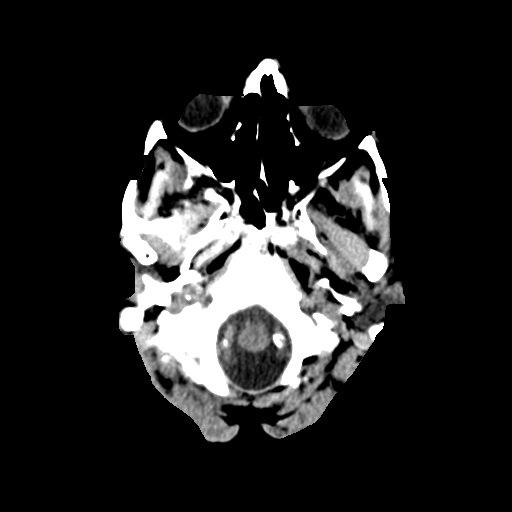
[im 3/30  bone]
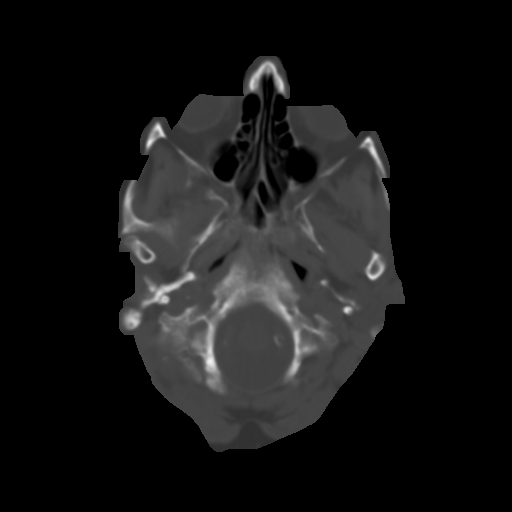
[im 6/30  brain]
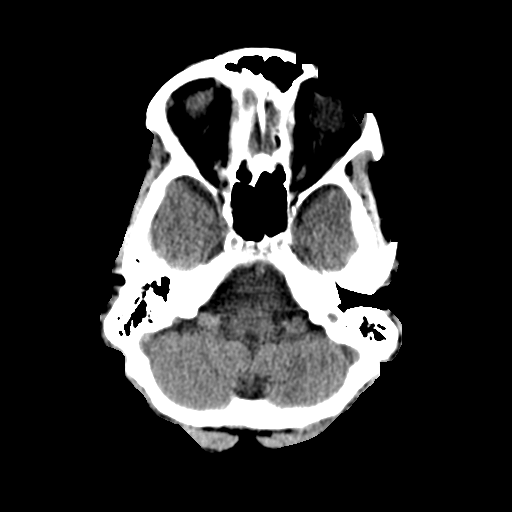
[im 9/30  brain]
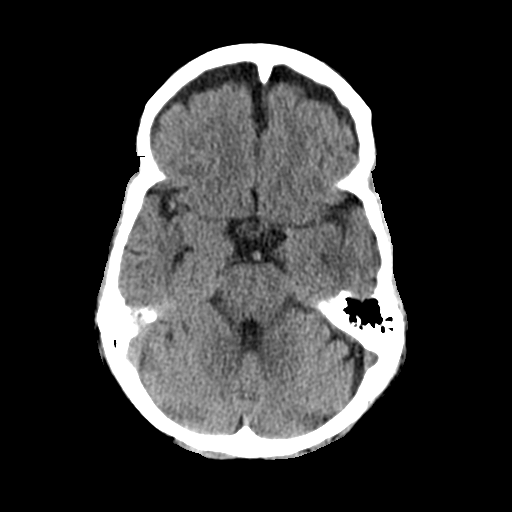
[im 12/30  brain]
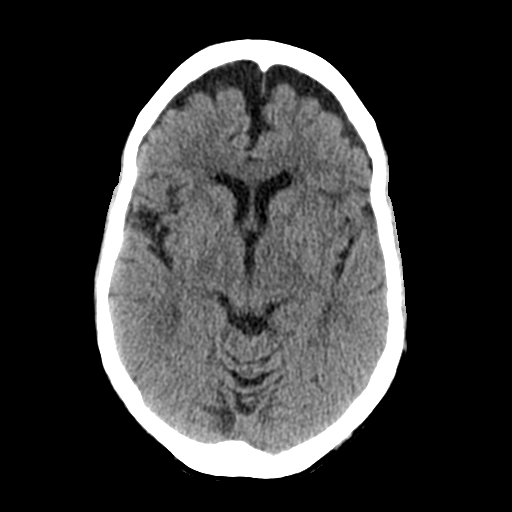
[im 16/30  brain]
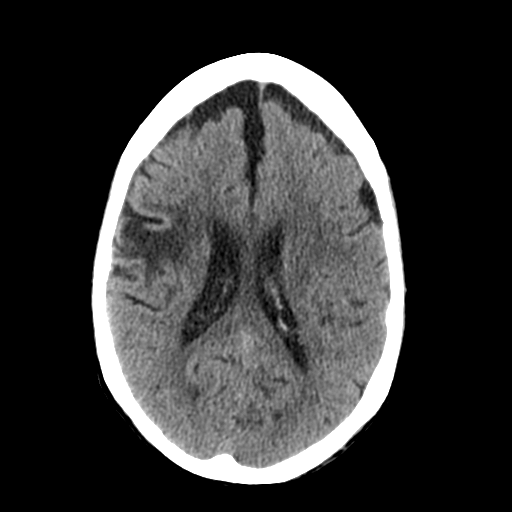
[im 16/30  bone]
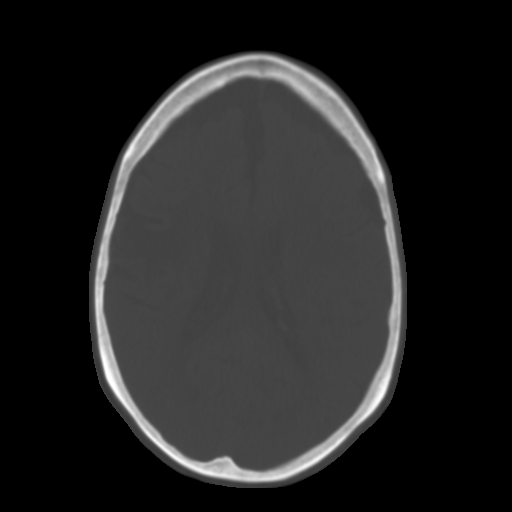
[im 19/30  brain]
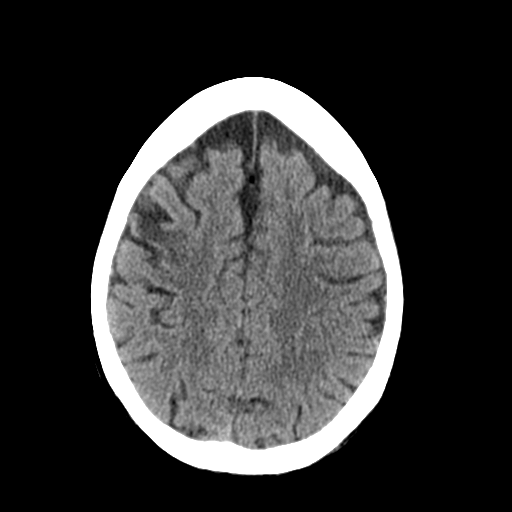
[im 22/30  brain]
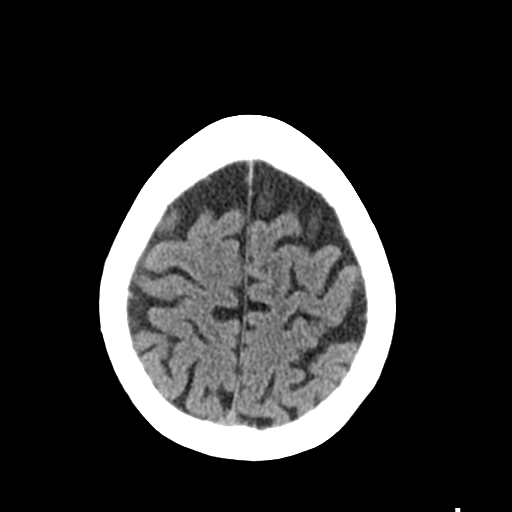
[im 25/30  brain]
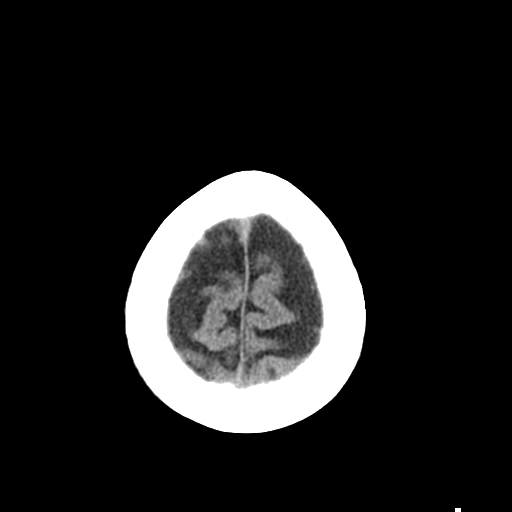
[im 28/30  brain]
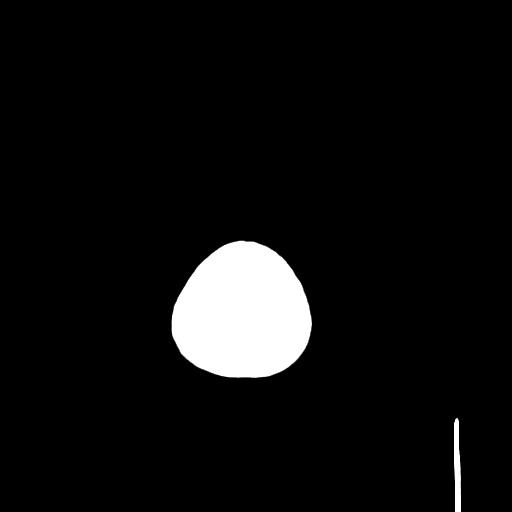
[im 28/30  bone]
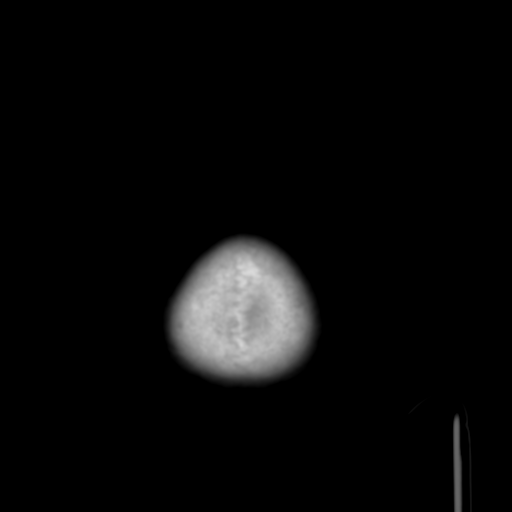

[Series 4: coronal soft · coronal · 0.32mm/px · 3 of 65 slices shown]
[im 22/65  brain]
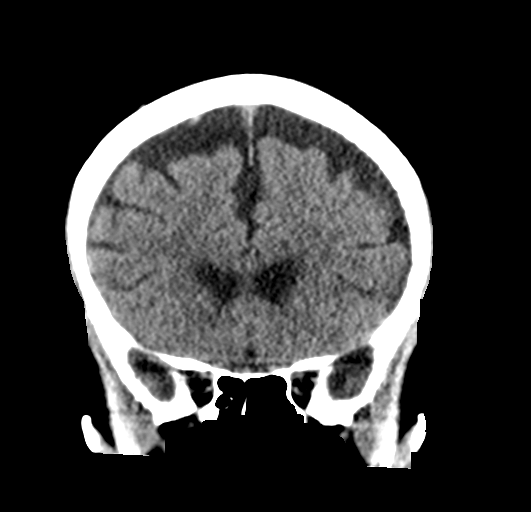
[im 29/65  brain]
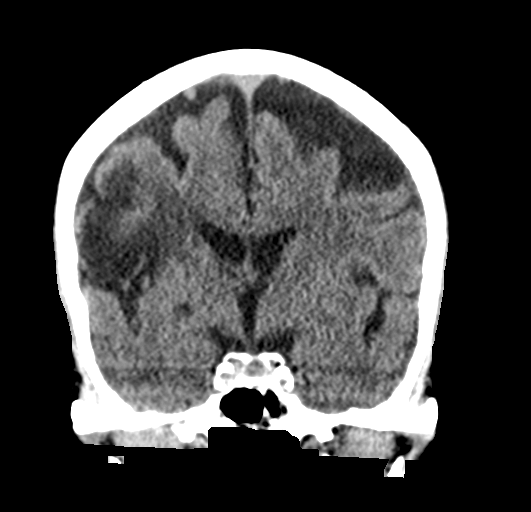
[im 36/65  brain]
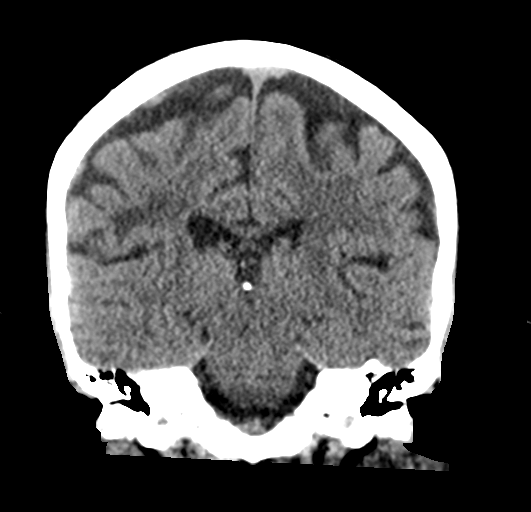

[Series 5: sagittal soft · sagittal · 0.33mm/px · 3 of 52 slices shown]
[im 18/52  brain]
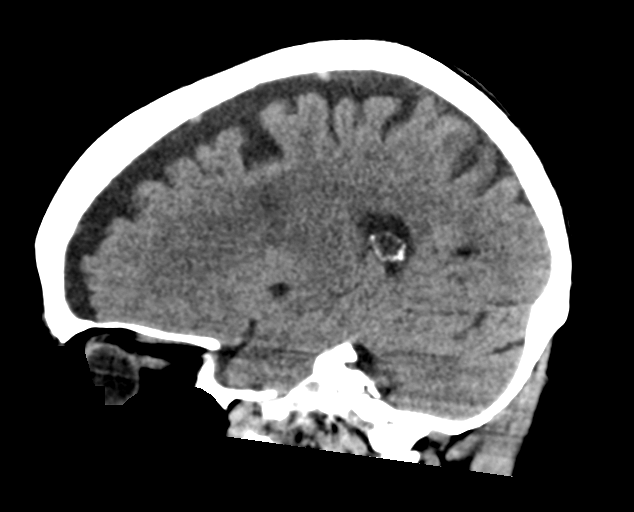
[im 26/52  brain]
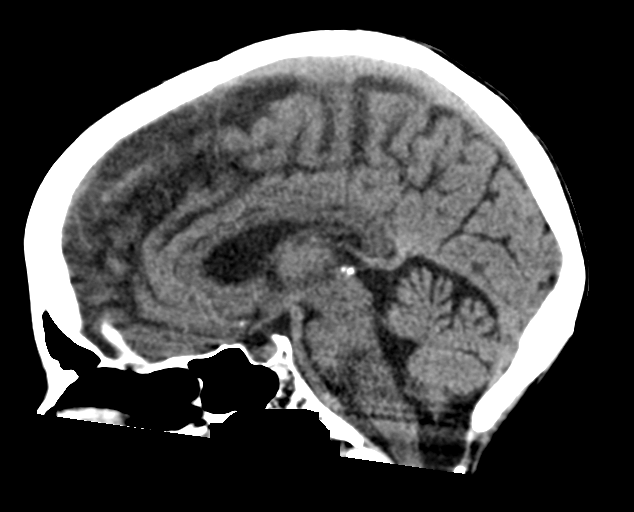
[im 35/52  brain]
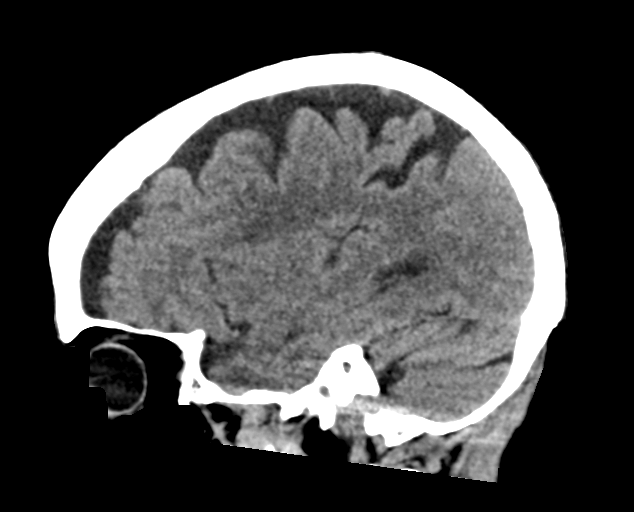

[15 of 47 positions shown; findings below may reference images not displayed]

FINDINGS: Brain:

A few tiny supratentorial and infratentorial infarcts which were
acute at time of prior MRI 01/03/2020 are not appreciable by CT.
Redemonstrated chronic cortically based infarct within the right MCA
vascular territory, affecting the right frontoparietal lobes and
posterior right insula. Redemonstrated chronic lacunar infarcts
within deep gray nuclei and right cerebellum. Background mild
ill-defined hypoattenuation within cerebral white matter is
nonspecific, but consistent with chronic small vessel ischemic
disease. Stable, mild generalized parenchymal atrophy.

There is no acute intracranial hemorrhage.

No acute demarcated cortical infarct is identified.

No extra-axial fluid collection.

No evidence of intracranial mass.

No midline shift.

Vascular: No hyperdense vessel.  Atherosclerotic calcifications.

Skull: Normal. Negative for fracture or focal lesion.

Sinuses/Orbits: Visualized orbits show no acute finding. No
significant paranasal sinus disease or mastoid effusion at the
imaged levels.
IMPRESSION: 1. No CT evidence of acute intracranial abnormality.
2. A few tiny supratentorial and infratentorial infarcts which were
acute at time of prior MRI 01/03/2020 are not appreciable by CT.
3. Redemonstrated chronic right frontoparietal cortically based
infarct.
4. Unchanged chronic lacunar infarcts within the deep gray nuclei
and right cerebellum. Stable background generalized parenchymal
atrophy and chronic small vessel ischemic disease.

## 2020-08-17 DIAGNOSIS — I2510 Atherosclerotic heart disease of native coronary artery without angina pectoris: Secondary | ICD-10-CM | POA: Diagnosis not present

## 2020-08-17 DIAGNOSIS — B351 Tinea unguium: Secondary | ICD-10-CM | POA: Diagnosis not present

## 2020-08-17 DIAGNOSIS — E1142 Type 2 diabetes mellitus with diabetic polyneuropathy: Secondary | ICD-10-CM | POA: Diagnosis not present

## 2020-08-17 DIAGNOSIS — L851 Acquired keratosis [keratoderma] palmaris et plantaris: Secondary | ICD-10-CM | POA: Diagnosis not present

## 2020-08-17 DIAGNOSIS — M6281 Muscle weakness (generalized): Secondary | ICD-10-CM | POA: Diagnosis not present

## 2020-08-17 DIAGNOSIS — I10 Essential (primary) hypertension: Secondary | ICD-10-CM | POA: Diagnosis not present

## 2020-08-17 DIAGNOSIS — R2689 Other abnormalities of gait and mobility: Secondary | ICD-10-CM | POA: Diagnosis not present

## 2020-08-17 DIAGNOSIS — R1310 Dysphagia, unspecified: Secondary | ICD-10-CM | POA: Diagnosis not present

## 2020-08-17 DIAGNOSIS — G4709 Other insomnia: Secondary | ICD-10-CM | POA: Diagnosis not present

## 2020-08-17 DIAGNOSIS — Z8673 Personal history of transient ischemic attack (TIA), and cerebral infarction without residual deficits: Secondary | ICD-10-CM | POA: Diagnosis not present

## 2020-08-20 ENCOUNTER — Other Ambulatory Visit (INDEPENDENT_AMBULATORY_CARE_PROVIDER_SITE_OTHER): Payer: Self-pay | Admitting: Gastroenterology

## 2020-08-20 ENCOUNTER — Telehealth (INDEPENDENT_AMBULATORY_CARE_PROVIDER_SITE_OTHER): Payer: Self-pay | Admitting: Gastroenterology

## 2020-08-20 DIAGNOSIS — I8510 Secondary esophageal varices without bleeding: Secondary | ICD-10-CM

## 2020-08-20 DIAGNOSIS — K219 Gastro-esophageal reflux disease without esophagitis: Secondary | ICD-10-CM

## 2020-08-20 MED ORDER — CARVEDILOL 6.25 MG PO TABS: 6.2500 mg | ORAL_TABLET | Freq: Two times a day (BID) | ORAL | 4 refills | Status: DC

## 2020-08-20 MED ORDER — OMEPRAZOLE 20 MG PO CPDR: 40.0000 mg | DELAYED_RELEASE_CAPSULE | Freq: Every day | ORAL | 4 refills | Status: DC

## 2020-08-20 NOTE — Telephone Encounter (Signed)
Noted  

## 2020-08-20 NOTE — Telephone Encounter (Signed)
I spoke with the patient's grand daughter ,[name] , she is her grandmothers caregiver. She shares with me that her grandmother had a EGD on 07/27/2020. There were changes to the patient 's medication. The Carvedilol was changed to 6.25 mg. This prescription was not received back from the pharmacy. Patient has been taking Carvedilol 3.125 mg - two by mouth in the morning and 2 by mouth in the evening.  Also her Lasix was changed from  40 mg to 20 mg daily . [name] states that the prescription was not received at pharmacy. The omeprazole 20 mg patient has taken 2 capsules daily for the 15 days and has stopped. They are not sure if she was to continue this medication or not.  They use [facility] in [place].  [name] states that she did not see Dr.Castaneda after the procedure a nurse talked with her. Please advise , thank you.

## 2020-08-20 NOTE — Telephone Encounter (Signed)
Granddaughter left voice mail message regarding patient medication changes - please advise - ph# [phone]

## 2020-08-20 NOTE — Telephone Encounter (Signed)
I spoke with the granddaughter, clarify the patient needs to be on 6.25 mg of carvedilol twice daily indefinitely, as well as omeprazole 40 mg daily due to presence of esophagitis.  She will continue the same dosage of Lasix as prescribed by the cardiologist as I did not do any changes in the medication. I will resend the prescription for both medications as they did not go in electronically.  Maylon Peppers, MD Gastroenterology and Hepatology [facility]

## 2020-08-21 DIAGNOSIS — I10 Essential (primary) hypertension: Secondary | ICD-10-CM | POA: Diagnosis not present

## 2020-08-21 DIAGNOSIS — R0602 Shortness of breath: Secondary | ICD-10-CM | POA: Diagnosis not present

## 2020-08-21 DIAGNOSIS — R059 Cough, unspecified: Secondary | ICD-10-CM | POA: Diagnosis not present

## 2020-08-22 ENCOUNTER — Ambulatory Visit (HOSPITAL_COMMUNITY)
Admission: RE | Admit: 2020-08-22 | Discharge: 2020-08-22 | Disposition: A | Discharge status: Home / Self Care | Payer: Medicare Other | Source: Ambulatory Visit | Attending: Physician Assistant | Admitting: Physician Assistant

## 2020-08-22 ENCOUNTER — Other Ambulatory Visit (HOSPITAL_COMMUNITY): Payer: Self-pay | Admitting: Physician Assistant

## 2020-08-22 ENCOUNTER — Telehealth (INDEPENDENT_AMBULATORY_CARE_PROVIDER_SITE_OTHER): Payer: Self-pay

## 2020-08-22 ENCOUNTER — Other Ambulatory Visit: Payer: Self-pay

## 2020-08-22 DIAGNOSIS — R079 Chest pain, unspecified: Secondary | ICD-10-CM

## 2020-08-22 DIAGNOSIS — R0602 Shortness of breath: Secondary | ICD-10-CM | POA: Diagnosis not present

## 2020-08-22 DIAGNOSIS — I10 Essential (primary) hypertension: Secondary | ICD-10-CM | POA: Diagnosis not present

## 2020-08-22 DIAGNOSIS — I2510 Atherosclerotic heart disease of native coronary artery without angina pectoris: Secondary | ICD-10-CM | POA: Diagnosis not present

## 2020-08-22 DIAGNOSIS — I4891 Unspecified atrial fibrillation: Secondary | ICD-10-CM | POA: Diagnosis not present

## 2020-08-22 NOTE — Telephone Encounter (Signed)
Britney the patient's granddaughter called today stating she left [place] confused about her grandmothers omeprazole. She states she was told by you that the patient was supposed to stay on the BID dose indefinitely, but the pharmacy is stating that was only for fifteen days. So after the fifteen days does the patient need to resume the omeprazole one daily? Please advise.

## 2020-08-22 NOTE — Telephone Encounter (Signed)
Not sure what is happening with the electronic e-script system at [facility].  She should be on the following: 6.25 mg of carvedilol twice daily indefinitely omeprazole 40 mg daily indefinitely.  Crystal, can you please call [place] an clarify this? Please send 5 refills for each medication.  Maylon Peppers, MD Gastroenterology and Hepatology [facility]

## 2020-08-22 NOTE — Telephone Encounter (Signed)
I spoke to trip at [place] and the medications were sent in the correct way with one year refills.

## 2020-08-23 ENCOUNTER — Ambulatory Visit (INDEPENDENT_AMBULATORY_CARE_PROVIDER_SITE_OTHER): Payer: Medicare Other

## 2020-08-23 DIAGNOSIS — R1310 Dysphagia, unspecified: Secondary | ICD-10-CM | POA: Diagnosis not present

## 2020-08-23 DIAGNOSIS — G4709 Other insomnia: Secondary | ICD-10-CM | POA: Diagnosis not present

## 2020-08-23 DIAGNOSIS — I639 Cerebral infarction, unspecified: Secondary | ICD-10-CM | POA: Diagnosis not present

## 2020-08-23 DIAGNOSIS — I2510 Atherosclerotic heart disease of native coronary artery without angina pectoris: Secondary | ICD-10-CM | POA: Diagnosis not present

## 2020-08-23 DIAGNOSIS — I10 Essential (primary) hypertension: Secondary | ICD-10-CM | POA: Diagnosis not present

## 2020-08-23 DIAGNOSIS — M6281 Muscle weakness (generalized): Secondary | ICD-10-CM | POA: Diagnosis not present

## 2020-08-23 DIAGNOSIS — R2689 Other abnormalities of gait and mobility: Secondary | ICD-10-CM | POA: Diagnosis not present

## 2020-08-23 DIAGNOSIS — Z8673 Personal history of transient ischemic attack (TIA), and cerebral infarction without residual deficits: Secondary | ICD-10-CM | POA: Diagnosis not present

## 2020-08-27 LAB — CUP PACEART REMOTE DEVICE CHECK
Date Time Interrogation Session: 20211218011731
Implantable Pulse Generator Implant Date: 20210602

## 2020-08-28 ENCOUNTER — Other Ambulatory Visit (INDEPENDENT_AMBULATORY_CARE_PROVIDER_SITE_OTHER): Payer: Self-pay

## 2020-08-28 ENCOUNTER — Telehealth (INDEPENDENT_AMBULATORY_CARE_PROVIDER_SITE_OTHER): Payer: Self-pay

## 2020-08-28 ENCOUNTER — Other Ambulatory Visit (INDEPENDENT_AMBULATORY_CARE_PROVIDER_SITE_OTHER): Payer: Self-pay | Admitting: Internal Medicine

## 2020-08-28 ENCOUNTER — Telehealth (INDEPENDENT_AMBULATORY_CARE_PROVIDER_SITE_OTHER): Payer: Self-pay | Admitting: Internal Medicine

## 2020-08-28 DIAGNOSIS — R1319 Other dysphagia: Secondary | ICD-10-CM

## 2020-08-28 NOTE — Telephone Encounter (Signed)
Patient left voice mail message stating she needs someone to call her regarding her crushing her medication - please advise - ph# [phone]

## 2020-08-28 NOTE — Telephone Encounter (Addendum)
Serena Colonel would you mind getting this set up for the patient and notifying her of the date of the test.  Patient is aware that she will need to continue to crush the pills until after the testing is done and will be expecting a call from the office arranging this.  Per Dr. Laural Golden continue to crush pills until after having a DG ESOPHAGUS W SINGLE CM.    Patient called wanting to know if she can stop having her pills crushed. She states her granddaughter crushes them for her and she thinks she can swallow them whole with out any problem. She states with the crushing of the pills they taste bad and would prefer to swallow whole. Please advise.

## 2020-08-28 NOTE — Telephone Encounter (Signed)
Patient is scheduled on Thursday 08/30/20 arriving at 10:45 npo 3 hrs prior

## 2020-08-28 NOTE — Telephone Encounter (Signed)
[name] the granddaughter called back and states that she was told the patient had to crush the pills as she was having issues with getting the medication down and that we told the patient we would not be stretching her throat again, so there for she needed to crush the pills. Granddaughter states she over heard her grandmother on the phone stating she could swallow with no problem and this is not true as the patient has had episodes of getting choked on the pills and turning blue.

## 2020-08-28 NOTE — Telephone Encounter (Signed)
[name] the patient granddaughter is aware of all.

## 2020-08-28 NOTE — Telephone Encounter (Signed)
Please advise 

## 2020-08-29 ENCOUNTER — Other Ambulatory Visit: Payer: Self-pay | Admitting: *Deleted

## 2020-08-29 ENCOUNTER — Encounter: Payer: Self-pay | Admitting: *Deleted

## 2020-08-29 NOTE — Patient Instructions (Addendum)
Goals Addressed            This Visit's Progress   . Increase physical activity       Timeframe:  Long-Range Goal Priority:  High Start Date:  08/29/20                           Expected End Date: 03/07/21  Patient's stated goal is to increase her mobility and strength. She plans to do this by continuing to work with physical therapy, do the physical therapy exercises several times weekly at home, and start to do some chair exercises.                         . Track and Manage My Blood Pressure-Hypertension       Timeframe:  Long-Range Goal Priority:  High Start Date: 08/29/20                            Expected End Date: 03/07/21                      Follow Up Date 12/05/20    - check blood pressure daily - choose a place to take my blood pressure (home, clinic or office, retail store) - write blood pressure results in a log or diary    Why is this important?    You won't feel high blood pressure, but it can still hurt your blood vessels.   High blood pressure can cause heart or kidney problems. It can also cause a stroke.   Making lifestyle changes like losing a little weight or eating less salt will help.   Checking your blood pressure at home and at different times of the day can help to control blood pressure.   If the doctor prescribes medicine remember to take it the way the doctor ordered.   Call the office if you cannot afford the medicine or if there are questions about it.     Notes: Patient explained she does check her B/P daily but is not writing down the values. Patient states she will start to record her B/P values. Nurse did discuss the importance of writing down the values in a log so the patient and her provider can easily see her values and determine if her B/P is under control. Nurse will send the patient hypertension education and a calendar booklet for her to record her values.

## 2020-08-29 NOTE — Patient Outreach (Signed)
[facility]) Care Management  [place]  08/29/2020   [name] 1941-04-21 [id number]  Subjective: Successful telephone outreach call to patient. HIPAA identifiers obtained. Patient reports she is doing well. She states she has not had any recent falls, denies any needs for DME, and is well supported by her granddaughter Gari Crown and son [name]. Patient explains that her strength and mobility has improved and she continues to see [facility] PT once weekly. She reports that she is doing the PT exercises several times a week and does want this nurse to send her chair exercise printouts. Patient states she is taking her B/P daily but is not recording her values. Her B/P today was 150/65 and patient explained that is a typical reading. Nurse provided education regarding the importance of recording her B/P values daily and explained it would be important for her to work towards lowering her B/P due to her history of stroke. Patient does want to receive hypertension education. She explained that it is difficult for her to limit her salt intake but she does try. Patient does have some difficulty swallowing pills but denies having any trouble with swallowing food. She currently is crushing her pills and is followed by Dr. Laural Golden who has dilated her esophagus several times. She does supplement with Ensure and states she is maintaining her weight and staying hydrated. Patient did not have any further questions or concerns today and did confirm that she has this nurse's contact number to call her if needed.    Encounter Medications:  Outpatient Encounter Medications as of 08/29/2020  Medication Sig Note  . acetaminophen (TYLENOL) 500 MG tablet Take 1,000 mg by mouth every 6 (six) hours as needed for moderate pain.   Marland Kitchen atorvastatin (LIPITOR) 80 MG tablet Take 1 tablet (80 mg total) by mouth daily.   . calcium citrate (CALCITRATE - DOSED IN MG ELEMENTAL CALCIUM) 950 MG tablet Take 1 tablet  (200 mg of elemental calcium total) by mouth daily. (Patient taking differently: Take 0.5 tablets by mouth daily.)   . carvedilol (COREG) 6.25 MG tablet Take 1 tablet (6.25 mg total) by mouth 2 (two) times daily with a meal.   . Coenzyme Q10 100 MG capsule Take 100 mg by mouth daily.   Marland Kitchen diltiazem (CARDIZEM CD) 120 MG 24 hr capsule TAKE (1) CAPSULE BY MOUTH ONCE DAILY. (Patient taking differently: Take 120 mg by mouth daily.)   . docusate sodium (COLACE) 100 MG capsule Take 100 mg by mouth daily as needed for moderate constipation.    Marland Kitchen ELIQUIS 5 MG TABS tablet TAKE (1) TABLET BY MOUTH TWICE DAILY. (Patient taking differently: Take 5 mg by mouth 2 (two) times daily.)   . fluticasone (FLONASE) 50 MCG/ACT nasal spray Place 2 sprays into both nostrils daily as needed for allergies.    . furosemide (LASIX) 20 MG tablet Take 20 mg by mouth daily. 07/20/2020: Pt takes 20 mg and 40 mg for a daily dose of 60 mg.  . HYDROcodone-acetaminophen (NORCO) 10-325 MG tablet Take 1 tablet by mouth as needed for pain. 08/29/2020: States she has not taken in a while  . methocarbamol (ROBAXIN) 500 MG tablet Take 500 mg by mouth 3 (three) times daily as needed for muscle spasms. 08/29/2020: States she has not taken in a while  . mirtazapine (REMERON) 30 MG tablet Take 30 mg by mouth at bedtime.   . Multiple Vitamins-Minerals (MULTIVITAMIN GUMMIES ADULT PO) Take 2 tablets by mouth daily.   . niacin (  NIASPAN) 500 MG CR tablet Take 500 mg by mouth daily.    Marland Kitchen omeprazole (PRILOSEC) 20 MG capsule Take 2 capsules (40 mg total) by mouth daily for 15 days.   . ondansetron (ZOFRAN) 4 MG tablet Take 4 mg by mouth every 8 (eight) hours as needed for nausea or vomiting.   . potassium chloride 20 MEQ/15ML (10%) SOLN Take 20 mEq by mouth daily.   . potassium chloride SA (KLOR-CON) 20 MEQ tablet Take 20 mEq by mouth daily.   Marland Kitchen Propylene Glycol (SYSTANE COMPLETE OP) Place 1 drop into both eyes in the morning and at bedtime.    No  facility-administered encounter medications on file as of 08/29/2020.    Functional Status:  In your present state of health, do you have any difficulty performing the following activities: 07/26/2020 06/13/2020  Hearing? N N  Vision? N N  Difficulty concentrating or making decisions? N N  Walking or climbing stairs? N N  Dressing or bathing? N N  Doing errands, shopping? N N  Some recent data might be hidden    Fall/Depression Screening: Fall Risk  08/29/2020 03/16/2019 09/27/2018  Falls in the past year? 1 1 0  Comment - - -  Number falls in past yr: 1 1 -  Injury with Fall? 1 1 -  Risk Factor Category  - - -  Risk for fall due to : History of fall(s);Impaired balance/gait;Impaired mobility History of fall(s);Impaired balance/gait;Impaired mobility -  Risk for fall due to: Comment - - -  Follow up Falls evaluation completed;Education provided;Falls prevention discussed Falls evaluation completed -   PHQ 2/9 Scores 08/29/2020 03/16/2019 03/16/2019 07/29/2018 05/13/2018 12/28/2017 09/15/2017  PHQ - 2 Score 1 0 0 0 1 0 1  PHQ- 9 Score 4 - - 1 - - -    Assessment:  Goals Addressed            This Visit's Progress   . Increase physical activity       Timeframe:  Long-Range Goal Priority:  High Start Date:  08/29/20                           Expected End Date: 03/07/21  Patient's stated goal is to increase her mobility and strength. She plans to do this by continuing to work with physical therapy, do the physical therapy exercises several times weekly at home, and start to do some chair exercises.                         . Track and Manage My Blood Pressure-Hypertension       Timeframe:  Long-Range Goal Priority:  High Start Date: 08/29/20                            Expected End Date: 03/07/21                      Follow Up Date 12/05/20    - check blood pressure daily - choose a place to take my blood pressure (home, clinic or office, retail store) - write blood pressure results  in a log or diary    Why is this important?    You won't feel high blood pressure, but it can still hurt your blood vessels.   High blood pressure can cause heart or kidney problems. It can also cause a  stroke.   Making lifestyle changes like losing a little weight or eating less salt will help.   Checking your blood pressure at home and at different times of the day can help to control blood pressure.   If the doctor prescribes medicine remember to take it the way the doctor ordered.   Call the office if you cannot afford the medicine or if there are questions about it.     Notes: Patient explained she does check her B/P daily but is not writing down the values. Patient states she will start to record her B/P values. Nurse did discuss the importance of writing down the values in a log so the patient and her provider can easily see her values and determine if her B/P is under control. Nurse will send the patient hypertension education and a calendar booklet for her to record her values.       Plan: RN Health Coach will send PCP a barrier letter and today's assessment note, will send patient hypertension education, chair exercise printouts, sleep tips eduction, and Ensure coupons, will  call patient within the month of March. Follow-up:  Patient agrees to Care Plan and Follow-up.  Emelia Loron RN, BSN [place] [phone] [email or web address]

## 2020-08-30 ENCOUNTER — Ambulatory Visit (HOSPITAL_COMMUNITY): Payer: Medicare Other

## 2020-08-30 DIAGNOSIS — R1310 Dysphagia, unspecified: Secondary | ICD-10-CM | POA: Diagnosis not present

## 2020-08-30 DIAGNOSIS — I2510 Atherosclerotic heart disease of native coronary artery without angina pectoris: Secondary | ICD-10-CM | POA: Diagnosis not present

## 2020-08-30 DIAGNOSIS — Z8673 Personal history of transient ischemic attack (TIA), and cerebral infarction without residual deficits: Secondary | ICD-10-CM | POA: Diagnosis not present

## 2020-08-30 DIAGNOSIS — M6281 Muscle weakness (generalized): Secondary | ICD-10-CM | POA: Diagnosis not present

## 2020-08-30 DIAGNOSIS — I10 Essential (primary) hypertension: Secondary | ICD-10-CM | POA: Diagnosis not present

## 2020-08-30 DIAGNOSIS — G4709 Other insomnia: Secondary | ICD-10-CM | POA: Diagnosis not present

## 2020-08-30 DIAGNOSIS — R2689 Other abnormalities of gait and mobility: Secondary | ICD-10-CM | POA: Diagnosis not present

## 2020-09-04 ENCOUNTER — Other Ambulatory Visit: Payer: Self-pay | Admitting: *Deleted

## 2020-09-04 DIAGNOSIS — M6281 Muscle weakness (generalized): Secondary | ICD-10-CM | POA: Diagnosis not present

## 2020-09-04 DIAGNOSIS — I10 Essential (primary) hypertension: Secondary | ICD-10-CM | POA: Diagnosis not present

## 2020-09-04 DIAGNOSIS — R2689 Other abnormalities of gait and mobility: Secondary | ICD-10-CM | POA: Diagnosis not present

## 2020-09-04 DIAGNOSIS — I2510 Atherosclerotic heart disease of native coronary artery without angina pectoris: Secondary | ICD-10-CM | POA: Diagnosis not present

## 2020-09-04 DIAGNOSIS — Z8673 Personal history of transient ischemic attack (TIA), and cerebral infarction without residual deficits: Secondary | ICD-10-CM | POA: Diagnosis not present

## 2020-09-04 DIAGNOSIS — R1310 Dysphagia, unspecified: Secondary | ICD-10-CM | POA: Diagnosis not present

## 2020-09-04 DIAGNOSIS — G4709 Other insomnia: Secondary | ICD-10-CM | POA: Diagnosis not present

## 2020-09-04 NOTE — Patient Outreach (Signed)
[facility]) Care Management  09/04/2020  Tovah Slavick 1941/07/02 [id number]  Successful telephone outreach call to patient's granddaughter. HIPAA identifiers obtained. Nurse called to follow-up after receiving a message that the patient's granddaughter called the [facility] nurse line last night due to the patient's pulse being elevated. [name] reports that the patient misread the pulse reading on the pulse oximeter. She was reading the value of 95 which was her oxygen saturation. Granddaughter reports that the patient is doing well and was appreciative that this nurse called to follow-up.  Plan: RN Health Coach will call patient within the month of March.  Emelia Loron RN, BSN [place] [phone] [email or web address]

## 2020-09-06 NOTE — Progress Notes (Signed)
Carelink Summary Report / Loop Recorder 

## 2020-09-07 DIAGNOSIS — E7849 Other hyperlipidemia: Secondary | ICD-10-CM | POA: Diagnosis not present

## 2020-09-07 DIAGNOSIS — I10 Essential (primary) hypertension: Secondary | ICD-10-CM | POA: Diagnosis not present

## 2020-09-10 ENCOUNTER — Ambulatory Visit (HOSPITAL_COMMUNITY): Payer: Medicare Other | Admitting: Physician Assistant

## 2020-09-10 ENCOUNTER — Encounter (HOSPITAL_COMMUNITY): Payer: Self-pay

## 2020-09-10 NOTE — Progress Notes (Incomplete)
Primary Care Physician: Redmond School, MD Primary Cardiologist: Dr Harl Bowie Primary Electrophysiologist: Dr Lovena Le Referring Physician: Dr Marni Griffon [name] is a 80 y.o. female with a history of HTN, several prior CVAs, CAD, liver cirrhosis, esophageal varices s/p banding and splenorenal shunt, OSA, DM, and paroxysmal atrial fibrillation who presents for follow up in the [facility]. The patient was initially diagnosed with atrial fibrillation 02/21/20 on ILR which had been placed for cryptogenic stroke. Patient has a CHADS2VASC score of 8. She remains in afib today with elevated heart rates. She has no awareness of her arrhythmia. Patient reports she was taken off of diltiazem after her most recent hospitalization although I see no mention of this in the DC summary. She denies significant snoring or alcohol use. She does have a h/o rheumatic fever as a child.   On follow up today, ***  Today, she denies symptoms of ***palpitations, chest pain, shortness of breath, orthopnea, PND, lower extremity edema, dizziness, presyncope, syncope, snoring, daytime somnolence, bleeding, or neurologic sequela. The patient is tolerating medications without difficulties and is otherwise without complaint today.    Atrial Fibrillation Risk Factors:  she does have symptoms or diagnosis of sleep apnea. she is not compliant with CPAP therapy. she does have a history of rheumatic fever. she does not have a history of alcohol use. The patient does not have a history of early familial atrial fibrillation or other arrhythmias.  she has a BMI of There is no height or weight on file to calculate BMI.. There were no vitals filed for this visit.  Family History  Problem Relation Age of Onset  . Diabetes Mother   . Heart failure Father   . Heart failure Maternal Aunt      Atrial Fibrillation Management history:  Previous antiarrhythmic drugs: none Previous cardioversions:  none Previous ablations: none CHADS2VASC score: 8 Anticoagulation history: Eliquis   Past Medical History:  Diagnosis Date  . Anemia   . Aortic insufficiency    Moderate  . Back pain   . Carotid stenosis, bilateral   . Cirrhosis ([place])   . Closed left femoral fracture ([place]) 04/21/2017  . Coronary artery disease    Stent x 2 RCA 1995, cardiac catheterization 09/2016 showing only mild atherosclerosis  . DDD (degenerative disc disease), lumbar   . Diabetes mellitus without complication ([place])   . Essential hypertension   . Falls   . GERD (gastroesophageal reflux disease)   . Hiatal hernia   . History of stroke   . Hypercholesteremia   . IBS (irritable bowel syndrome)   . Non-alcoholic fatty liver disease   . OSA (obstructive sleep apnea)    no CPAP  . Pericardial effusion    a. small by echo 2018.  Marland Kitchen PSVT (paroxysmal supraventricular tachycardia) ([place])   . Recurrent UTI   . Seizures ([place]) 2018  . Stroke ([place]) 07/2019   several strokes within the last 10 years  . Swallowing difficulty   . Varices, esophageal ([place])    Past Surgical History:  Procedure Laterality Date  . [place]  . APPENDECTOMY    . BACK SURGERY  1985  . CARDIAC CATHETERIZATION  [phone]   Stent to the proximal RCA after MI   . CARDIAC CATHETERIZATION N/A 09/26/2016   Procedure: Left Heart Cath and Coronary Angiography;  Surgeon: Leonie Man, MD;  Location: [facility];  Service: Cardiovascular;  Laterality: N/A;  . CATARACT EXTRACTION W/PHACO  Right 07/04/2014   Procedure: CATARACT EXTRACTION PHACO AND INTRAOCULAR LENS PLACEMENT (IOC);  Surgeon: Elta Guadeloupe T. Gershon Crane, MD;  Location: [facility];  Service: Ophthalmology;  Laterality: Right;  CDE:13.13  . COLONOSCOPY    . DILATION AND CURETTAGE OF UTERUS     x2  . Epi Retinal Membrane Peel Left   . ERCP    . ESOPHAGEAL BANDING N/A 04/01/2013   Procedure: ESOPHAGEAL BANDING;  Surgeon: Rogene Houston, MD;  Location[facility];   Service: Endoscopy;  Laterality: N/A;  . ESOPHAGEAL BANDING N/A 05/24/2013   Procedure: ESOPHAGEAL BANDING;  Surgeon: Rogene Houston, MD;  [facility];  Service: Endoscopy;  Laterality: N/A;  . ESOPHAGEAL BANDING N/A 06/21/2014   Procedure: ESOPHAGEAL BANDING;  Surgeon: Rogene Houston, MD;  [facility];  Service: Endoscopy;  Laterality: N/A;  . ESOPHAGEAL BANDING  06/15/2020   Procedure: ESOPHAGEAL BANDING;  Surgeon: Harvel Quale, MD;  [facility];  Service: Gastroenterology;;  . ESOPHAGOGASTRODUODENOSCOPY N/A 04/01/2013   Procedure: ESOPHAGOGASTRODUODENOSCOPY (EGD);  Surgeon: Rogene Houston, MD;  [facility];  Service: Endoscopy;  Laterality: N/A;  230-rescheduled to 8:30am Ann notified pt  . ESOPHAGOGASTRODUODENOSCOPY N/A 05/24/2013   Procedure: ESOPHAGOGASTRODUODENOSCOPY (EGD);  Surgeon: Rogene Houston, MD;  [facility];  Service: Endoscopy;  Laterality: N/A;  730  . ESOPHAGOGASTRODUODENOSCOPY N/A 06/21/2014   Procedure: ESOPHAGOGASTRODUODENOSCOPY (EGD);  Surgeon: Rogene Houston, MD;  [facility];  Service: Endoscopy;  Laterality: N/A;  930-rescheduled 10/14 @ 1200 Ann to notify pt  . ESOPHAGOGASTRODUODENOSCOPY (EGD) WITH PROPOFOL N/A 06/15/2020   Procedure: ESOPHAGOGASTRODUODENOSCOPY (EGD) WITH PROPOFOL;  Surgeon: Harvel Quale, MD;  [facility];  Service: Gastroenterology;  Laterality: N/A;  230  . ESOPHAGOGASTRODUODENOSCOPY (EGD) WITH PROPOFOL N/A 07/27/2020   Procedure: ESOPHAGOGASTRODUODENOSCOPY (EGD) WITH PROPOFOL;  Surgeon: Harvel Quale, MD;  [facility];  Service: Gastroenterology;  Laterality: N/A;  1245  . EYE SURGERY  08   cataract surgery of the left eye  . FEMUR IM NAIL Left 03/02/2017   Procedure: INTRAMEDULLARY (IM) NAIL FEMORAL;  Surgeon: Rod Can, MD;  Location: [facility];  Service: Orthopedics;  Laterality: Left;  . HARDWARE REMOVAL Right  01/17/2013   Procedure: REMOVAL OF HARDWARE AND EXCISION ULNAR STYLOID RIGHT WRIST;  Surgeon: Tennis Must, MD;  Location: [facility];  Service: Orthopedics;  Laterality: Right;  . implanted heart monitor  02/2020   to monitor Afib  . MYRINGOTOMY  2012   both ears  . ORIF FEMUR FRACTURE Left 04/23/2017   Procedure: OPEN REDUCTION INTERNAL FIXATION (ORIF) DISTAL FEMUR FRACTURE (FRACTURE AROUND FEMORAL NAIL);  Surgeon: Altamese [place], MD;  Location: [facility];  Service: Orthopedics;  Laterality: Left;  . TONSILLECTOMY    . TUBAL LIGATION    . VAGINAL HYSTERECTOMY  1972  . WRIST SURGERY     rt wrist hardwear removal    Current Outpatient Medications  Medication Sig Dispense Refill  . acetaminophen (TYLENOL) 500 MG tablet Take 1,000 mg by mouth every 6 (six) hours as needed for moderate pain.    Marland Kitchen atorvastatin (LIPITOR) 80 MG tablet Take 1 tablet (80 mg total) by mouth daily. 30 tablet 1  . calcium citrate (CALCITRATE - DOSED IN MG ELEMENTAL CALCIUM) 950 MG tablet Take 1 tablet (200 mg of elemental calcium total) by mouth daily. (Patient taking differently: Take 0.5 tablets by mouth daily.) 30 tablet 1  . carvedilol (COREG)  6.25 MG tablet Take 1 tablet (6.25 mg total) by mouth 2 (two) times daily with a meal. 180 tablet 4  . Coenzyme Q10 100 MG capsule Take 100 mg by mouth daily.    Marland Kitchen diltiazem (CARDIZEM CD) 120 MG 24 hr capsule TAKE (1) CAPSULE BY MOUTH ONCE DAILY. (Patient taking differently: Take 120 mg by mouth daily.) 30 capsule 6  . docusate sodium (COLACE) 100 MG capsule Take 100 mg by mouth daily as needed for moderate constipation.     Marland Kitchen ELIQUIS 5 MG TABS tablet TAKE (1) TABLET BY MOUTH TWICE DAILY. (Patient taking differently: Take 5 mg by mouth 2 (two) times daily.) 60 tablet 6  . fluticasone (FLONASE) 50 MCG/ACT nasal spray Place 2 sprays into both nostrils daily as needed for allergies.     . furosemide (LASIX) 20 MG tablet Take 20 mg by mouth daily.    Marland Kitchen  HYDROcodone-acetaminophen (NORCO) 10-325 MG tablet Take 1 tablet by mouth as needed for pain.    . methocarbamol (ROBAXIN) 500 MG tablet Take 500 mg by mouth 3 (three) times daily as needed for muscle spasms.    . mirtazapine (REMERON) 30 MG tablet Take 30 mg by mouth at bedtime.    . Multiple Vitamins-Minerals (MULTIVITAMIN GUMMIES ADULT PO) Take 2 tablets by mouth daily.    . niacin (NIASPAN) 500 MG CR tablet Take 500 mg by mouth daily.     Marland Kitchen omeprazole (PRILOSEC) 20 MG capsule Take 2 capsules (40 mg total) by mouth daily for 15 days. 90 capsule 4  . ondansetron (ZOFRAN) 4 MG tablet Take 4 mg by mouth every 8 (eight) hours as needed for nausea or vomiting.    . potassium chloride 20 MEQ/15ML (10%) SOLN Take 20 mEq by mouth daily.    . potassium chloride SA (KLOR-CON) 20 MEQ tablet Take 20 mEq by mouth daily.    Marland Kitchen Propylene Glycol (SYSTANE COMPLETE OP) Place 1 drop into both eyes in the morning and at bedtime.     No current facility-administered medications for this visit.    Allergies  Allergen Reactions  . Tape Rash and Other (See Comments)    Please use paper tape    Social History   Socioeconomic History  . Marital status: Widowed    Spouse name: Not on file  . Number of children: 2  . Years of education: 2 years college  . Highest education level: Some college, no degree  Occupational History    Employer: RETIRED  Tobacco Use  . Smoking status: Smoker, Current Status Unknown    Packs/day: 0.25    Years: 50.00    Pack years: 12.50    Types: Cigarettes    Last attempt to quit: 11/03/2017    Years since quitting: 2.8  . Smokeless tobacco: Never Used  . Tobacco comment: 2 cigarettes daily  Vaping Use  . Vaping Use: Never used  Substance and Sexual Activity  . Alcohol use: No    Alcohol/week: 0.0 standard drinks  . Drug use: No  . Sexual activity: Not Currently    Birth control/protection: Surgical  Other Topics Concern  . Not on file  Social History Narrative    06/12/20 Lives with grandchildren   Social Determinants of Health   Financial Resource Strain: Not on file  Food Insecurity: Not on file  Transportation Needs: No Transportation Needs  . Lack of Transportation (Medical): No  . Lack of Transportation (Non-Medical): No  Physical Activity: Not on file  Stress: Not  on file  Social Connections: Not on file  Intimate Partner Violence: Not on file     ROS- All systems are reviewed and negative except as per the HPI above.  Physical Exam: There were no vitals filed for this visit.  GEN- The patient is well appearing, alert and oriented x 3 today.   HEENT-head normocephalic, atraumatic, sclera clear, conjunctiva pink, hearing intact, trachea midline. Lungs- Clear to ausculation bilaterally, normal work of breathing Heart- ***Regular rate and rhythm, no murmurs, rubs or gallops  GI- soft, NT, ND, + BS Extremities- no clubbing, cyanosis, or edema MS- no significant deformity or atrophy Skin- no rash or lesion Psych- euthymic mood, full affect Neuro- strength and sensation are intact   Wt Readings from Last 3 Encounters:  07/27/20 63.5 kg  07/26/20 68 kg  06/15/20 70.8 kg    EKG today demonstrates  ***  Echo 01/05/20 demonstrated  1. Left ventricular ejection fraction, by estimation, is 60 to 65%. The  left ventricle has normal function. The left ventricle has no regional  wall motion abnormalities. There is mild left ventricular hypertrophy.  Left ventricular diastolic parameters  are consistent with Grade II diastolic dysfunction (pseudonormalization).  Elevated left atrial pressure.  2. Right ventricular systolic function is normal. The right ventricular  size is normal. There is mildly elevated pulmonary artery systolic  pressure.  3. Left atrial size was severely dilated.  4. The mitral valve was not well visualized. Mild mitral valve  regurgitation. Moderate mitral stenosis.MV peak gradient, 13.4 mmHg. The  mean  mitral valve gradient is 5.0 mmHg.  5. The aortic valve was not well visualized. Aortic valve regurgitation  is moderate. Moderate aortic valve stenosis.  6. The inferior vena cava is normal in size with greater than 50%  respiratory variability, suggesting right atrial pressure of 3 mmHg.   Epic records are reviewed at length today  CHA2DS2-VASc Score = 8  The patient's score is based upon: CHF History: No HTN History: Yes Diabetes History: Yes Stroke History: Yes Vascular Disease History: Yes      ASSESSMENT AND PLAN: 1. Persistent Atrial Fibrillation The patient's CHA2DS2-VASc score is 8, indicating a 10.8% annual risk of stroke.   ILR shows 2% afib burden, down from 4.8% *** Continue Eliquis 5 mg BID Continue diltiazem 120 mg daily  Continue Toprol 25 mg BID  2. Secondary Hypercoagulable State (ICD10:  D68.69) The patient is at significant risk for stroke/thromboembolism based upon her CHA2DS2-VASc Score of 8.  Start Apixaban (Eliquis).   3. Obstructive sleep apnea Patient is not on CPAP therapy. ***  4. CAD No anginal symptoms. ***  5. HTN Stable, no changes today. ***  6. Valvular heart disease Mod AS, mod MS ***   Follow up with Dr Harl Bowie and Dr Lovena Le per recall. AF clinic in 6 months. ***   Adline Peals PA-C [facility] [address] [place], [place] [address] [phone] 09/10/2020 9:32 AM

## 2020-09-11 ENCOUNTER — Ambulatory Visit (HOSPITAL_COMMUNITY): Admission: RE | Admit: 2020-09-11 | Payer: Medicare Other | Source: Ambulatory Visit

## 2020-09-14 DIAGNOSIS — G4709 Other insomnia: Secondary | ICD-10-CM | POA: Diagnosis not present

## 2020-09-14 DIAGNOSIS — R1310 Dysphagia, unspecified: Secondary | ICD-10-CM | POA: Diagnosis not present

## 2020-09-14 DIAGNOSIS — R2689 Other abnormalities of gait and mobility: Secondary | ICD-10-CM | POA: Diagnosis not present

## 2020-09-14 DIAGNOSIS — I2510 Atherosclerotic heart disease of native coronary artery without angina pectoris: Secondary | ICD-10-CM | POA: Diagnosis not present

## 2020-09-14 DIAGNOSIS — I10 Essential (primary) hypertension: Secondary | ICD-10-CM | POA: Diagnosis not present

## 2020-09-14 DIAGNOSIS — I69398 Other sequelae of cerebral infarction: Secondary | ICD-10-CM | POA: Diagnosis not present

## 2020-09-14 DIAGNOSIS — M6281 Muscle weakness (generalized): Secondary | ICD-10-CM | POA: Diagnosis not present

## 2020-09-18 ENCOUNTER — Other Ambulatory Visit (INDEPENDENT_AMBULATORY_CARE_PROVIDER_SITE_OTHER): Payer: Self-pay | Admitting: Internal Medicine

## 2020-09-18 ENCOUNTER — Ambulatory Visit (HOSPITAL_COMMUNITY)
Admission: RE | Admit: 2020-09-18 | Discharge: 2020-09-18 | Disposition: A | Discharge status: Home / Self Care | Payer: Medicare Other | Source: Ambulatory Visit | Attending: Internal Medicine | Admitting: Internal Medicine

## 2020-09-18 ENCOUNTER — Other Ambulatory Visit: Payer: Self-pay

## 2020-09-18 ENCOUNTER — Other Ambulatory Visit (HOSPITAL_COMMUNITY): Payer: Self-pay | Admitting: Internal Medicine

## 2020-09-18 DIAGNOSIS — I2510 Atherosclerotic heart disease of native coronary artery without angina pectoris: Secondary | ICD-10-CM | POA: Diagnosis not present

## 2020-09-18 DIAGNOSIS — G4709 Other insomnia: Secondary | ICD-10-CM | POA: Diagnosis not present

## 2020-09-18 DIAGNOSIS — K224 Dyskinesia of esophagus: Secondary | ICD-10-CM | POA: Diagnosis not present

## 2020-09-18 DIAGNOSIS — R1319 Other dysphagia: Secondary | ICD-10-CM | POA: Diagnosis not present

## 2020-09-18 DIAGNOSIS — R1310 Dysphagia, unspecified: Secondary | ICD-10-CM | POA: Diagnosis not present

## 2020-09-18 DIAGNOSIS — I10 Essential (primary) hypertension: Secondary | ICD-10-CM | POA: Diagnosis not present

## 2020-09-18 DIAGNOSIS — R2689 Other abnormalities of gait and mobility: Secondary | ICD-10-CM | POA: Diagnosis not present

## 2020-09-18 DIAGNOSIS — M6281 Muscle weakness (generalized): Secondary | ICD-10-CM | POA: Diagnosis not present

## 2020-09-18 DIAGNOSIS — I69398 Other sequelae of cerebral infarction: Secondary | ICD-10-CM | POA: Diagnosis not present

## 2020-09-18 MED ORDER — POLYETHYLENE GLYCOL 3350 17 GM/SCOOP PO POWD: 8.5000 g | Freq: Every day | ORAL | 0 refills | Status: DC

## 2020-09-20 DIAGNOSIS — G4709 Other insomnia: Secondary | ICD-10-CM | POA: Diagnosis not present

## 2020-09-20 DIAGNOSIS — R1310 Dysphagia, unspecified: Secondary | ICD-10-CM | POA: Diagnosis not present

## 2020-09-20 DIAGNOSIS — I2510 Atherosclerotic heart disease of native coronary artery without angina pectoris: Secondary | ICD-10-CM | POA: Diagnosis not present

## 2020-09-20 DIAGNOSIS — M6281 Muscle weakness (generalized): Secondary | ICD-10-CM | POA: Diagnosis not present

## 2020-09-20 DIAGNOSIS — I69398 Other sequelae of cerebral infarction: Secondary | ICD-10-CM | POA: Diagnosis not present

## 2020-09-20 DIAGNOSIS — I10 Essential (primary) hypertension: Secondary | ICD-10-CM | POA: Diagnosis not present

## 2020-09-20 DIAGNOSIS — R2689 Other abnormalities of gait and mobility: Secondary | ICD-10-CM | POA: Diagnosis not present

## 2020-09-24 ENCOUNTER — Ambulatory Visit (INDEPENDENT_AMBULATORY_CARE_PROVIDER_SITE_OTHER): Payer: Medicare Other

## 2020-09-24 DIAGNOSIS — I639 Cerebral infarction, unspecified: Secondary | ICD-10-CM

## 2020-09-27 DIAGNOSIS — I2510 Atherosclerotic heart disease of native coronary artery without angina pectoris: Secondary | ICD-10-CM | POA: Diagnosis not present

## 2020-09-27 DIAGNOSIS — I69398 Other sequelae of cerebral infarction: Secondary | ICD-10-CM | POA: Diagnosis not present

## 2020-09-27 DIAGNOSIS — R1310 Dysphagia, unspecified: Secondary | ICD-10-CM | POA: Diagnosis not present

## 2020-09-27 DIAGNOSIS — I10 Essential (primary) hypertension: Secondary | ICD-10-CM | POA: Diagnosis not present

## 2020-09-27 DIAGNOSIS — M6281 Muscle weakness (generalized): Secondary | ICD-10-CM | POA: Diagnosis not present

## 2020-09-27 DIAGNOSIS — R2689 Other abnormalities of gait and mobility: Secondary | ICD-10-CM | POA: Diagnosis not present

## 2020-09-27 DIAGNOSIS — G4709 Other insomnia: Secondary | ICD-10-CM | POA: Diagnosis not present

## 2020-09-27 LAB — CUP PACEART REMOTE DEVICE CHECK
Date Time Interrogation Session: 20220120014300
Implantable Pulse Generator Implant Date: 20210602

## 2020-09-28 DIAGNOSIS — M6281 Muscle weakness (generalized): Secondary | ICD-10-CM | POA: Diagnosis not present

## 2020-09-28 DIAGNOSIS — I69398 Other sequelae of cerebral infarction: Secondary | ICD-10-CM | POA: Diagnosis not present

## 2020-09-28 DIAGNOSIS — R2689 Other abnormalities of gait and mobility: Secondary | ICD-10-CM | POA: Diagnosis not present

## 2020-09-28 DIAGNOSIS — I10 Essential (primary) hypertension: Secondary | ICD-10-CM | POA: Diagnosis not present

## 2020-10-02 DIAGNOSIS — R2689 Other abnormalities of gait and mobility: Secondary | ICD-10-CM | POA: Diagnosis not present

## 2020-10-02 DIAGNOSIS — M6281 Muscle weakness (generalized): Secondary | ICD-10-CM | POA: Diagnosis not present

## 2020-10-02 DIAGNOSIS — G4709 Other insomnia: Secondary | ICD-10-CM | POA: Diagnosis not present

## 2020-10-02 DIAGNOSIS — R1310 Dysphagia, unspecified: Secondary | ICD-10-CM | POA: Diagnosis not present

## 2020-10-02 DIAGNOSIS — I69398 Other sequelae of cerebral infarction: Secondary | ICD-10-CM | POA: Diagnosis not present

## 2020-10-02 DIAGNOSIS — I10 Essential (primary) hypertension: Secondary | ICD-10-CM | POA: Diagnosis not present

## 2020-10-02 DIAGNOSIS — I2510 Atherosclerotic heart disease of native coronary artery without angina pectoris: Secondary | ICD-10-CM | POA: Diagnosis not present

## 2020-10-03 DIAGNOSIS — R1310 Dysphagia, unspecified: Secondary | ICD-10-CM | POA: Diagnosis not present

## 2020-10-03 DIAGNOSIS — R2689 Other abnormalities of gait and mobility: Secondary | ICD-10-CM | POA: Diagnosis not present

## 2020-10-03 DIAGNOSIS — M6281 Muscle weakness (generalized): Secondary | ICD-10-CM | POA: Diagnosis not present

## 2020-10-03 DIAGNOSIS — I2510 Atherosclerotic heart disease of native coronary artery without angina pectoris: Secondary | ICD-10-CM | POA: Diagnosis not present

## 2020-10-03 DIAGNOSIS — I69398 Other sequelae of cerebral infarction: Secondary | ICD-10-CM | POA: Diagnosis not present

## 2020-10-03 DIAGNOSIS — G4709 Other insomnia: Secondary | ICD-10-CM | POA: Diagnosis not present

## 2020-10-03 DIAGNOSIS — I10 Essential (primary) hypertension: Secondary | ICD-10-CM | POA: Diagnosis not present

## 2020-10-04 DIAGNOSIS — M6281 Muscle weakness (generalized): Secondary | ICD-10-CM | POA: Diagnosis not present

## 2020-10-04 DIAGNOSIS — G4709 Other insomnia: Secondary | ICD-10-CM | POA: Diagnosis not present

## 2020-10-04 DIAGNOSIS — I10 Essential (primary) hypertension: Secondary | ICD-10-CM | POA: Diagnosis not present

## 2020-10-04 DIAGNOSIS — R2689 Other abnormalities of gait and mobility: Secondary | ICD-10-CM | POA: Diagnosis not present

## 2020-10-04 DIAGNOSIS — I2510 Atherosclerotic heart disease of native coronary artery without angina pectoris: Secondary | ICD-10-CM | POA: Diagnosis not present

## 2020-10-04 DIAGNOSIS — R1310 Dysphagia, unspecified: Secondary | ICD-10-CM | POA: Diagnosis not present

## 2020-10-04 DIAGNOSIS — I69398 Other sequelae of cerebral infarction: Secondary | ICD-10-CM | POA: Diagnosis not present

## 2020-10-08 NOTE — Progress Notes (Signed)
Carelink Summary Report / Loop Recorder 

## 2020-10-10 DIAGNOSIS — I69398 Other sequelae of cerebral infarction: Secondary | ICD-10-CM | POA: Diagnosis not present

## 2020-10-10 DIAGNOSIS — M6281 Muscle weakness (generalized): Secondary | ICD-10-CM | POA: Diagnosis not present

## 2020-10-10 DIAGNOSIS — I10 Essential (primary) hypertension: Secondary | ICD-10-CM | POA: Diagnosis not present

## 2020-10-10 DIAGNOSIS — R2689 Other abnormalities of gait and mobility: Secondary | ICD-10-CM | POA: Diagnosis not present

## 2020-10-10 DIAGNOSIS — R1310 Dysphagia, unspecified: Secondary | ICD-10-CM | POA: Diagnosis not present

## 2020-10-10 DIAGNOSIS — G4709 Other insomnia: Secondary | ICD-10-CM | POA: Diagnosis not present

## 2020-10-10 DIAGNOSIS — I2510 Atherosclerotic heart disease of native coronary artery without angina pectoris: Secondary | ICD-10-CM | POA: Diagnosis not present

## 2020-10-11 DIAGNOSIS — M6281 Muscle weakness (generalized): Secondary | ICD-10-CM | POA: Diagnosis not present

## 2020-10-11 DIAGNOSIS — I2510 Atherosclerotic heart disease of native coronary artery without angina pectoris: Secondary | ICD-10-CM | POA: Diagnosis not present

## 2020-10-11 DIAGNOSIS — I10 Essential (primary) hypertension: Secondary | ICD-10-CM | POA: Diagnosis not present

## 2020-10-11 DIAGNOSIS — R1310 Dysphagia, unspecified: Secondary | ICD-10-CM | POA: Diagnosis not present

## 2020-10-11 DIAGNOSIS — I69398 Other sequelae of cerebral infarction: Secondary | ICD-10-CM | POA: Diagnosis not present

## 2020-10-11 DIAGNOSIS — G4709 Other insomnia: Secondary | ICD-10-CM | POA: Diagnosis not present

## 2020-10-11 DIAGNOSIS — R2689 Other abnormalities of gait and mobility: Secondary | ICD-10-CM | POA: Diagnosis not present

## 2020-10-12 DIAGNOSIS — R2689 Other abnormalities of gait and mobility: Secondary | ICD-10-CM | POA: Diagnosis not present

## 2020-10-12 DIAGNOSIS — M6281 Muscle weakness (generalized): Secondary | ICD-10-CM | POA: Diagnosis not present

## 2020-10-12 DIAGNOSIS — I69398 Other sequelae of cerebral infarction: Secondary | ICD-10-CM | POA: Diagnosis not present

## 2020-10-12 DIAGNOSIS — G4709 Other insomnia: Secondary | ICD-10-CM | POA: Diagnosis not present

## 2020-10-12 DIAGNOSIS — I2510 Atherosclerotic heart disease of native coronary artery without angina pectoris: Secondary | ICD-10-CM | POA: Diagnosis not present

## 2020-10-12 DIAGNOSIS — R1310 Dysphagia, unspecified: Secondary | ICD-10-CM | POA: Diagnosis not present

## 2020-10-12 DIAGNOSIS — I10 Essential (primary) hypertension: Secondary | ICD-10-CM | POA: Diagnosis not present

## 2020-10-13 ENCOUNTER — Other Ambulatory Visit (HOSPITAL_COMMUNITY)
Admission: RE | Admit: 2020-10-13 | Discharge: 2020-10-13 | Disposition: A | Discharge status: Home / Self Care | Payer: Medicare Other | Source: Ambulatory Visit | Attending: Pulmonary Disease | Admitting: Pulmonary Disease

## 2020-10-17 DIAGNOSIS — R1310 Dysphagia, unspecified: Secondary | ICD-10-CM | POA: Diagnosis not present

## 2020-10-17 DIAGNOSIS — I69398 Other sequelae of cerebral infarction: Secondary | ICD-10-CM | POA: Diagnosis not present

## 2020-10-17 DIAGNOSIS — M6281 Muscle weakness (generalized): Secondary | ICD-10-CM | POA: Diagnosis not present

## 2020-10-17 DIAGNOSIS — I10 Essential (primary) hypertension: Secondary | ICD-10-CM | POA: Diagnosis not present

## 2020-10-17 DIAGNOSIS — R2689 Other abnormalities of gait and mobility: Secondary | ICD-10-CM | POA: Diagnosis not present

## 2020-10-17 DIAGNOSIS — G4709 Other insomnia: Secondary | ICD-10-CM | POA: Diagnosis not present

## 2020-10-17 DIAGNOSIS — I2510 Atherosclerotic heart disease of native coronary artery without angina pectoris: Secondary | ICD-10-CM | POA: Diagnosis not present

## 2020-10-18 DIAGNOSIS — G4709 Other insomnia: Secondary | ICD-10-CM | POA: Diagnosis not present

## 2020-10-18 DIAGNOSIS — I10 Essential (primary) hypertension: Secondary | ICD-10-CM | POA: Diagnosis not present

## 2020-10-18 DIAGNOSIS — R2689 Other abnormalities of gait and mobility: Secondary | ICD-10-CM | POA: Diagnosis not present

## 2020-10-18 DIAGNOSIS — I2510 Atherosclerotic heart disease of native coronary artery without angina pectoris: Secondary | ICD-10-CM | POA: Diagnosis not present

## 2020-10-18 DIAGNOSIS — I69398 Other sequelae of cerebral infarction: Secondary | ICD-10-CM | POA: Diagnosis not present

## 2020-10-18 DIAGNOSIS — M6281 Muscle weakness (generalized): Secondary | ICD-10-CM | POA: Diagnosis not present

## 2020-10-18 DIAGNOSIS — R1310 Dysphagia, unspecified: Secondary | ICD-10-CM | POA: Diagnosis not present

## 2020-10-20 DIAGNOSIS — G4709 Other insomnia: Secondary | ICD-10-CM | POA: Diagnosis not present

## 2020-10-20 DIAGNOSIS — I2510 Atherosclerotic heart disease of native coronary artery without angina pectoris: Secondary | ICD-10-CM | POA: Diagnosis not present

## 2020-10-20 DIAGNOSIS — I10 Essential (primary) hypertension: Secondary | ICD-10-CM | POA: Diagnosis not present

## 2020-10-20 DIAGNOSIS — M6281 Muscle weakness (generalized): Secondary | ICD-10-CM | POA: Diagnosis not present

## 2020-10-20 DIAGNOSIS — R1310 Dysphagia, unspecified: Secondary | ICD-10-CM | POA: Diagnosis not present

## 2020-10-20 DIAGNOSIS — I69398 Other sequelae of cerebral infarction: Secondary | ICD-10-CM | POA: Diagnosis not present

## 2020-10-20 DIAGNOSIS — R2689 Other abnormalities of gait and mobility: Secondary | ICD-10-CM | POA: Diagnosis not present

## 2020-10-22 DIAGNOSIS — R1310 Dysphagia, unspecified: Secondary | ICD-10-CM | POA: Diagnosis not present

## 2020-10-22 DIAGNOSIS — I2510 Atherosclerotic heart disease of native coronary artery without angina pectoris: Secondary | ICD-10-CM | POA: Diagnosis not present

## 2020-10-22 DIAGNOSIS — M6281 Muscle weakness (generalized): Secondary | ICD-10-CM | POA: Diagnosis not present

## 2020-10-22 DIAGNOSIS — G4709 Other insomnia: Secondary | ICD-10-CM | POA: Diagnosis not present

## 2020-10-22 DIAGNOSIS — I69398 Other sequelae of cerebral infarction: Secondary | ICD-10-CM | POA: Diagnosis not present

## 2020-10-22 DIAGNOSIS — I10 Essential (primary) hypertension: Secondary | ICD-10-CM | POA: Diagnosis not present

## 2020-10-22 DIAGNOSIS — R2689 Other abnormalities of gait and mobility: Secondary | ICD-10-CM | POA: Diagnosis not present

## 2020-10-23 DIAGNOSIS — R2689 Other abnormalities of gait and mobility: Secondary | ICD-10-CM | POA: Diagnosis not present

## 2020-10-23 DIAGNOSIS — M6281 Muscle weakness (generalized): Secondary | ICD-10-CM | POA: Diagnosis not present

## 2020-10-23 DIAGNOSIS — I10 Essential (primary) hypertension: Secondary | ICD-10-CM | POA: Diagnosis not present

## 2020-10-23 DIAGNOSIS — G4709 Other insomnia: Secondary | ICD-10-CM | POA: Diagnosis not present

## 2020-10-23 DIAGNOSIS — R1310 Dysphagia, unspecified: Secondary | ICD-10-CM | POA: Diagnosis not present

## 2020-10-23 DIAGNOSIS — I2510 Atherosclerotic heart disease of native coronary artery without angina pectoris: Secondary | ICD-10-CM | POA: Diagnosis not present

## 2020-10-23 DIAGNOSIS — I69398 Other sequelae of cerebral infarction: Secondary | ICD-10-CM | POA: Diagnosis not present

## 2020-10-25 ENCOUNTER — Ambulatory Visit (INDEPENDENT_AMBULATORY_CARE_PROVIDER_SITE_OTHER): Payer: Medicare Other

## 2020-10-25 DIAGNOSIS — I639 Cerebral infarction, unspecified: Secondary | ICD-10-CM

## 2020-10-26 DIAGNOSIS — E1142 Type 2 diabetes mellitus with diabetic polyneuropathy: Secondary | ICD-10-CM | POA: Diagnosis not present

## 2020-10-26 DIAGNOSIS — B351 Tinea unguium: Secondary | ICD-10-CM | POA: Diagnosis not present

## 2020-10-26 DIAGNOSIS — L851 Acquired keratosis [keratoderma] palmaris et plantaris: Secondary | ICD-10-CM | POA: Diagnosis not present

## 2020-10-30 LAB — CUP PACEART REMOTE DEVICE CHECK
Date Time Interrogation Session: 20220222015415
Implantable Pulse Generator Implant Date: 20210602

## 2020-10-31 NOTE — Progress Notes (Signed)
Carelink Summary Report / Loop Recorder 

## 2020-11-05 DIAGNOSIS — I10 Essential (primary) hypertension: Secondary | ICD-10-CM | POA: Diagnosis not present

## 2020-11-05 DIAGNOSIS — E7849 Other hyperlipidemia: Secondary | ICD-10-CM | POA: Diagnosis not present

## 2020-11-26 ENCOUNTER — Ambulatory Visit (INDEPENDENT_AMBULATORY_CARE_PROVIDER_SITE_OTHER): Payer: Medicare Other

## 2020-11-26 DIAGNOSIS — I639 Cerebral infarction, unspecified: Secondary | ICD-10-CM

## 2020-11-27 ENCOUNTER — Other Ambulatory Visit: Payer: Self-pay | Admitting: *Deleted

## 2020-11-27 NOTE — Patient Outreach (Signed)
[facility]) Care Management  11/27/2020  Laytoya Ion 21-Aug-1941 [id number]  Unsuccessful outreach attempt made to patient. Patient's grand-daughter answered the phone and stated that the patient is unavailable to speak.  [name] did request that this nurse call back at a later date.   Plan: RN Health Coach will call patient within the month of March.  Emelia Loron RN, BSN [place] [phone] [email or web address]

## 2020-11-29 ENCOUNTER — Other Ambulatory Visit: Payer: Self-pay | Admitting: *Deleted

## 2020-11-30 NOTE — Patient Outreach (Signed)
[facility]) Care Management  [place]  11/29/20   Corliss Coggeshall 01-26-1941 [id number]  Subjective: Successful telephone outreach call to patient. HIPAA identifiers obtained. Patient states she has not had any recent falls, denies any needs for DME, and is well supported by her granddaughter Gari Crown and son [name]. Patient explains that she wants to maintain her strength and mobility. She continues to do PT exercises routinely. She is taking her B/P daily but is not recording the values. Patient reports that her B/P values have come down and today's B/P is 130/48. Nurse educated about the importance of recording her B/P values. Unfortunately, patient did not receive the packet that this nurse sent previously which included a calendar booklet, hypertension education, and chair exercise printouts; nurse will send this information to patient and apologize for the mishap. Patient states she is doing better with limiting her sodium intake and explains that her appetite has not been good lately. Patient does supplement with Ensure and this nurse will send her some Ensure coupons. Patient states that she is staying hydrated.  Patient did not have any further questions or concerns today and did confirm that the patient has this nurse's contact number to call her if needed.   Encounter Medications:  Outpatient Encounter Medications as of 11/29/2020  Medication Sig Note  . acetaminophen (TYLENOL) 500 MG tablet Take 1,000 mg by mouth every 6 (six) hours as needed for moderate pain.   Marland Kitchen atorvastatin (LIPITOR) 80 MG tablet Take 1 tablet (80 mg total) by mouth daily.   . calcium citrate (CALCITRATE - DOSED IN MG ELEMENTAL CALCIUM) 950 MG tablet Take 1 tablet (200 mg of elemental calcium total) by mouth daily. (Patient taking differently: Take 0.5 tablets by mouth daily.)   . carvedilol (COREG) 6.25 MG tablet Take 1 tablet (6.25 mg total) by mouth 2 (two) times daily with a meal.   .  Coenzyme Q10 100 MG capsule Take 100 mg by mouth daily.   Marland Kitchen diltiazem (CARDIZEM CD) 120 MG 24 hr capsule TAKE (1) CAPSULE BY MOUTH ONCE DAILY. (Patient taking differently: Take 120 mg by mouth daily.)   . ELIQUIS 5 MG TABS tablet TAKE (1) TABLET BY MOUTH TWICE DAILY. (Patient taking differently: Take 5 mg by mouth 2 (two) times daily.)   . fluticasone (FLONASE) 50 MCG/ACT nasal spray Place 2 sprays into both nostrils daily as needed for allergies.    . furosemide (LASIX) 20 MG tablet Take 20 mg by mouth daily. 07/20/2020: Pt takes 20 mg and 40 mg for a daily dose of 60 mg.  . HYDROcodone-acetaminophen (NORCO) 10-325 MG tablet Take 1 tablet by mouth as needed for pain. 08/29/2020: States she has not taken in a while  . methocarbamol (ROBAXIN) 500 MG tablet Take 500 mg by mouth 3 (three) times daily as needed for muscle spasms. 08/29/2020: States she has not taken in a while  . mirtazapine (REMERON) 30 MG tablet Take 30 mg by mouth at bedtime.   . Multiple Vitamins-Minerals (MULTIVITAMIN GUMMIES ADULT PO) Take 2 tablets by mouth daily.   . niacin (NIASPAN) 500 MG CR tablet Take 500 mg by mouth daily.    Marland Kitchen omeprazole (PRILOSEC) 20 MG capsule Take 2 capsules (40 mg total) by mouth daily for 15 days.   . ondansetron (ZOFRAN) 4 MG tablet Take 4 mg by mouth every 8 (eight) hours as needed for nausea or vomiting.   . polyethylene glycol powder (GLYCOLAX/MIRALAX) 17 GM/SCOOP powder Take 8.5 g by  mouth daily.   . potassium chloride 20 MEQ/15ML (10%) SOLN Take 20 mEq by mouth daily.   . potassium chloride SA (KLOR-CON) 20 MEQ tablet Take 20 mEq by mouth daily.   Marland Kitchen Propylene Glycol (SYSTANE COMPLETE OP) Place 1 drop into both eyes in the morning and at bedtime.    No facility-administered encounter medications on file as of 11/29/2020.    Functional Status:  In your present state of health, do you have any difficulty performing the following activities: 07/26/2020 06/13/2020  Hearing? N N  Vision? N N   Difficulty concentrating or making decisions? N N  Walking or climbing stairs? N N  Dressing or bathing? N N  Doing errands, shopping? N N  Some recent data might be hidden    Fall/Depression Screening: Fall Risk  11/29/2020 08/29/2020 03/16/2019  Falls in the past year? 1 1 1   Comment - - -  Number falls in past yr: 1 1 1   Injury with Fall? 1 1 1   Risk Factor Category  - - -  Risk for fall due to : Impaired mobility;Impaired balance/gait;History of fall(s) History of fall(s);Impaired balance/gait;Impaired mobility History of fall(s);Impaired balance/gait;Impaired mobility  Risk for fall due to: Comment - - -  Follow up Falls prevention discussed;Education provided;Falls evaluation completed Falls evaluation completed;Education provided;Falls prevention discussed Falls evaluation completed   PHQ 2/9 Scores 08/29/2020 03/16/2019 03/16/2019 07/29/2018 05/13/2018 12/28/2017 09/15/2017  PHQ - 2 Score 1 0 0 0 1 0 1  PHQ- 9 Score 4 - - 1 - - -    Assessment:  Goals Addressed            This Visit's Progress   . [facility]) Patient will verbalize continuation of doing PT exercises routinely for the next 90 days       Timeframe:  Long-Range Goal Priority:  High Start Date:  08/29/20                           Expected End Date: 09/06/21 Follow Up Date: 03/07/21  -Encourage continuation of doing PT exercises routinely -Discussed doing chair exercises    Notes: Patient stated goal is to increase her mobility and strength. She plans to do this by continuing to do physical therapy exercises several times weekly at home, and start to do some chair exercises.                      Updated 11/29/20: Patient reports she did not receive the chair exercise printouts. Nurse apologized and will send chair exercise printouts for the patient to follow.    Marland Kitchen [facility]) Patient will verbalize monitoring her B/P and recording the values daily within the next 90 days       Timeframe:  Long-Range Goal Priority:   High Start Date: 08/29/20                            Expected End Date:09/06/21                   Follow Up Date 03/07/21   - check blood pressure daily - choose a place to take my blood pressure (home, clinic or office, retail store) - write blood pressure results in a log or diary  -Encouraged patient to begin to record her B/P values to be able to monitor her trends  Why is this important?    You won't  feel high blood pressure, but it can still hurt your blood vessels.   High blood pressure can cause heart or kidney problems. It can also cause a stroke.   Making lifestyle changes like losing a little weight or eating less salt will help.   Checking your blood pressure at home and at different times of the day can help to control blood pressure.   If the doctor prescribes medicine remember to take it the way the doctor ordered.   Call the office if you cannot afford the medicine or if there are questions about it.     Notes: Patient explained she does check her B/P daily but is not writing down the values. Patient states she will start to record her B/P values. Nurse did discuss the importance of writing down the values in a log so the patient and her provider can easily see her values and determine if her B/P is under control. Nurse will send the patient hypertension education and a calendar booklet for her to record her values.   Updated 11/29/20: Patient states that she did not receive the calendar booklet or hypertension education. Nurse apologize and will send the materials. Patient reports taking her B/P daily but not recording the values. Nurse did discuss the importance of recording her values to be able to monitor her trends and have the values available for her doctor to evaluate her B/P control.      Plan: RN Health Coach will send PCP a quarterly update, will send patient a calendar booklet, chair exercise printouts, Ensure coupons, and sleep and hypertension education, and  will call patient within the month of June. Follow-up:  Patient agrees to Care Plan and Follow-up.  Emelia Loron RN, BSN [place] [phone] [email or web address]

## 2020-11-30 NOTE — Patient Instructions (Addendum)
Goals Addressed            This Visit's Progress   . [facility]) Patient will verbalize continuation of doing PT exercises routinely for the next 90 days       Timeframe:  Long-Range Goal Priority:  High Start Date:  08/29/20                           Expected End Date: 09/06/21 Follow Up Date: 03/07/21  -Encourage continuation of doing PT exercises routinely -Discussed doing chair exercises    Notes: Patient stated goal is to increase her mobility and strength. She plans to do this by continuing to do physical therapy exercises several times weekly at home, and start to do some chair exercises.                      Updated 11/29/20: Patient reports she did not receive the chair exercise printouts. Nurse apologized and will send chair exercise printouts for the patient to follow.    Marland Kitchen [facility]) Patient will verbalize monitoring her B/P and recording the values daily within the next 90 days       Timeframe:  Long-Range Goal Priority:  High Start Date: 08/29/20                            Expected End Date:09/06/21                   Follow Up Date 03/07/21   - check blood pressure daily - choose a place to take my blood pressure (home, clinic or office, retail store) - write blood pressure results in a log or diary  -Encouraged patient to begin to record her B/P values to be able to monitor her trends  Why is this important?    You won't feel high blood pressure, but it can still hurt your blood vessels.   High blood pressure can cause heart or kidney problems. It can also cause a stroke.   Making lifestyle changes like losing a little weight or eating less salt will help.   Checking your blood pressure at home and at different times of the day can help to control blood pressure.   If the doctor prescribes medicine remember to take it the way the doctor ordered.   Call the office if you cannot afford the medicine or if there are questions about it.     Notes: Patient explained she  does check her B/P daily but is not writing down the values. Patient states she will start to record her B/P values. Nurse did discuss the importance of writing down the values in a log so the patient and her provider can easily see her values and determine if her B/P is under control. Nurse will send the patient hypertension education and a calendar booklet for her to record her values.   Updated 11/29/20: Patient states that she did not receive the calendar booklet or hypertension education. Nurse apologize and will send the materials. Patient reports taking her B/P daily but not recording the values. Nurse did discuss the importance of recording her values to be able to monitor her trends and have the values available for her doctor to evaluate her B/P control.

## 2020-12-03 NOTE — Progress Notes (Signed)
Carelink Summary Report / Loop Recorder 

## 2020-12-05 DIAGNOSIS — I10 Essential (primary) hypertension: Secondary | ICD-10-CM | POA: Diagnosis not present

## 2020-12-05 DIAGNOSIS — E7849 Other hyperlipidemia: Secondary | ICD-10-CM | POA: Diagnosis not present

## 2020-12-17 DIAGNOSIS — E559 Vitamin D deficiency, unspecified: Secondary | ICD-10-CM | POA: Diagnosis not present

## 2020-12-17 DIAGNOSIS — E119 Type 2 diabetes mellitus without complications: Secondary | ICD-10-CM | POA: Diagnosis not present

## 2020-12-17 DIAGNOSIS — I8500 Esophageal varices without bleeding: Secondary | ICD-10-CM | POA: Diagnosis not present

## 2020-12-17 DIAGNOSIS — I480 Paroxysmal atrial fibrillation: Secondary | ICD-10-CM | POA: Diagnosis not present

## 2020-12-17 DIAGNOSIS — Z79899 Other long term (current) drug therapy: Secondary | ICD-10-CM | POA: Diagnosis not present

## 2020-12-27 ENCOUNTER — Ambulatory Visit (INDEPENDENT_AMBULATORY_CARE_PROVIDER_SITE_OTHER): Payer: Medicare Other

## 2020-12-27 DIAGNOSIS — I639 Cerebral infarction, unspecified: Secondary | ICD-10-CM

## 2020-12-27 LAB — CUP PACEART REMOTE DEVICE CHECK
Date Time Interrogation Session: 20220420232103
Implantable Pulse Generator Implant Date: 20210602

## 2021-01-04 DIAGNOSIS — B351 Tinea unguium: Secondary | ICD-10-CM | POA: Diagnosis not present

## 2021-01-04 DIAGNOSIS — E1142 Type 2 diabetes mellitus with diabetic polyneuropathy: Secondary | ICD-10-CM | POA: Diagnosis not present

## 2021-01-04 DIAGNOSIS — L851 Acquired keratosis [keratoderma] palmaris et plantaris: Secondary | ICD-10-CM | POA: Diagnosis not present

## 2021-01-05 DIAGNOSIS — I10 Essential (primary) hypertension: Secondary | ICD-10-CM | POA: Diagnosis not present

## 2021-01-05 DIAGNOSIS — E7849 Other hyperlipidemia: Secondary | ICD-10-CM | POA: Diagnosis not present

## 2021-01-10 ENCOUNTER — Other Ambulatory Visit: Payer: Self-pay

## 2021-01-10 ENCOUNTER — Ambulatory Visit (INDEPENDENT_AMBULATORY_CARE_PROVIDER_SITE_OTHER): Payer: Medicare Other | Admitting: Cardiology

## 2021-01-10 ENCOUNTER — Encounter: Payer: Self-pay | Admitting: Cardiology

## 2021-01-10 VITALS — BP 150/82 | HR 90 | Ht 63.0 in | Wt 154.4 lb

## 2021-01-10 DIAGNOSIS — I350 Nonrheumatic aortic (valve) stenosis: Secondary | ICD-10-CM

## 2021-01-10 DIAGNOSIS — I2510 Atherosclerotic heart disease of native coronary artery without angina pectoris: Secondary | ICD-10-CM | POA: Diagnosis not present

## 2021-01-10 DIAGNOSIS — I4891 Unspecified atrial fibrillation: Secondary | ICD-10-CM

## 2021-01-10 NOTE — Progress Notes (Signed)
Clinical Summary [name] is a 80 y.o.female seen today for follow up of the following medical problems.   1. PAF/Prior history of PSVT - history of cryptogenic stroke, loop recorder placed and detected afib - seen in afib clinic.  - PAF, she is on diltiazem and toprol. Eliquis for stroke prevention.    -recent palpitations. 4-5 times over the last month, mainly at night. Lasts just a few seconds - limited compilance with medications per family due to swallowing problems, has to crush meds  2. CAD - history of MI in 1995, received 2 stents to RCA - cath 2007 with patent coronaries.  -cath Jan 2018 nonobstructive disease - echo 10/2016 LVEF 65-70%, no WMAs  - admit 10/2016 with elevated troponin to 1.33, Thought to be demand ischemia related to her SVT at the time - admit 12/2016 with chest pain. Mild troponin 0.32 at that time. Managed medically due to recent normal cath and stable echo  - episode of SOB, resolved with NG. Was very upset just prior to this episode according to family.    3. 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 Aortic valve disease - 06/2018 moderate to severe AI, LVEF 65-70%, no LVIDs reported. Does not mention aortic stenosis however mean grad 27 but no AVA VTI given. From 04/2018 study had moderate AS with mean grad 21 and AVA VTI 1.06  12/2019 echo mean grad 19, AVA VTI 1.14 - no recent symptoms   6. Mitral stenosis -12/2019 echo mean grad 5  7. History of CVA - admittion 07/2018, 12/2019 - has loop recorder for history of cryptogenic stroke.   8. Hyperlipidemia 06/2018 TC 109 TG 50 HDL 49 LDL 50 12/2019 TC 162 HDL 63 TG 78 LDL 83 -has been on atorvastatin 63m long term  - labs followed by pcp  9. HTN - has not taken meds yet today.    Past Medical History:  Diagnosis Date  . Anemia    . Aortic insufficiency    Moderate  . Back pain   . Carotid stenosis, bilateral   . Cirrhosis ([place]   . Closed left femoral fracture ([place] 04/21/2017  . Coronary artery disease    Stent x 2 RCA 1995, cardiac catheterization 09/2016 showing only mild atherosclerosis  . DDD (degenerative disc disease), lumbar   . Diabetes mellitus without complication ([facility] [place]   . Essential hypertension   . Falls   . GERD (gastroesophageal reflux disease)   . Hiatal hernia   . History of stroke   . Hypercholesteremia   . IBS (irritable bowel syndrome)   . Non-alcoholic fatty liver disease   . OSA (obstructive sleep apnea)    no CPAP  . Pericardial effusion    a. small by echo 2018.  .Marland KitchenPSVT (paroxysmal supraventricular tachycardia) ([facility]   . Recurrent UTI   . Seizures ([place] 2018  . Stroke ([facility] 07/2019   several strokes within the last 10 years  . Swallowing difficulty   . Varices, esophageal (HCC)      Allergies  Allergen Reactions  . Tape Rash and Other (See Comments)    Please use paper tape     Current Outpatient Medications  Medication Sig Dispense Refill  . acetaminophen (TYLENOL) 500 MG tablet Take 1,000 mg by mouth every 6 (six) hours as needed for moderate pain.    .Marland Kitchen  atorvastatin (LIPITOR) 80 MG tablet Take 1 tablet (80 mg total) by mouth daily. 30 tablet 1  . calcium citrate (CALCITRATE - DOSED IN MG ELEMENTAL CALCIUM) 950 MG tablet Take 1 tablet (200 mg of elemental calcium total) by mouth daily. (Patient taking differently: Take 0.5 tablets by mouth daily.) 30 tablet 1  . carvedilol (COREG) 6.25 MG tablet Take 1 tablet (6.25 mg total) by mouth 2 (two) times daily with a meal. 180 tablet 4  . Coenzyme Q10 100 MG capsule Take 100 mg by mouth daily.    Marland Kitchen diltiazem (CARDIZEM CD) 120 MG 24 hr capsule TAKE (1) CAPSULE BY MOUTH ONCE DAILY. (Patient taking differently: Take 120 mg by mouth daily.) 30 capsule 6  . ELIQUIS 5 MG TABS tablet TAKE (1) TABLET BY MOUTH TWICE DAILY. (Patient  taking differently: Take 5 mg by mouth 2 (two) times daily.) 60 tablet 6  . fluticasone (FLONASE) 50 MCG/ACT nasal spray Place 2 sprays into both nostrils daily as needed for allergies.     . furosemide (LASIX) 20 MG tablet Take 20 mg by mouth daily.    Marland Kitchen HYDROcodone-acetaminophen (NORCO) 10-325 MG tablet Take 1 tablet by mouth as needed for pain.    . methocarbamol (ROBAXIN) 500 MG tablet Take 500 mg by mouth 3 (three) times daily as needed for muscle spasms.    . mirtazapine (REMERON) 30 MG tablet Take 30 mg by mouth at bedtime.    . Multiple Vitamins-Minerals (MULTIVITAMIN GUMMIES ADULT PO) Take 2 tablets by mouth daily.    . niacin (NIASPAN) 500 MG CR tablet Take 500 mg by mouth daily.     Marland Kitchen omeprazole (PRILOSEC) 20 MG capsule Take 2 capsules (40 mg total) by mouth daily for 15 days. 90 capsule 4  . ondansetron (ZOFRAN) 4 MG tablet Take 4 mg by mouth every 8 (eight) hours as needed for nausea or vomiting.    . polyethylene glycol powder (GLYCOLAX/MIRALAX) 17 GM/SCOOP powder Take 8.5 g by mouth daily. 255 g 0  . potassium chloride 20 MEQ/15ML (10%) SOLN Take 20 mEq by mouth daily.    . potassium chloride SA (KLOR-CON) 20 MEQ tablet Take 20 mEq by mouth daily.    Marland Kitchen Propylene Glycol (SYSTANE COMPLETE OP) Place 1 drop into both eyes in the morning and at bedtime.     No current facility-administered medications for this visit.     Past Surgical History:  Procedure Laterality Date  . [place]  . APPENDECTOMY    . BACK SURGERY  1985  . CARDIAC CATHETERIZATION  [phone]   Stent to the proximal RCA after MI   . CARDIAC CATHETERIZATION N/A 09/26/2016   Procedure: Left Heart Cath and Coronary Angiography;  Surgeon: Leonie Man, MD;  Location: [facility];  Service: Cardiovascular;  Laterality: N/A;  . CATARACT EXTRACTION W/PHACO Right 07/04/2014   Procedure: CATARACT EXTRACTION PHACO AND INTRAOCULAR LENS PLACEMENT (IOC);  Surgeon: Elta Guadeloupe T. Gershon Crane, MD;   Location: [facility];  Service: Ophthalmology;  Laterality: Right;  CDE:13.13  . COLONOSCOPY    . DILATION AND CURETTAGE OF UTERUS     x2  . Epi Retinal Membrane Peel Left   . ERCP    . ESOPHAGEAL BANDING N/A 04/01/2013   Procedure: ESOPHAGEAL BANDING;  Surgeon: Rogene [place], MD;  Location: [facility];  Service: Endoscopy;  Laterality: N/A;  . ESOPHAGEAL BANDING N/A 05/24/2013   Procedure: ESOPHAGEAL BANDING;  Surgeon: Rogene [place], MD;  [facility]:  [facility];  Service: Endoscopy;  Laterality: N/A;  . ESOPHAGEAL BANDING N/A 06/21/2014   Procedure: ESOPHAGEAL BANDING;  Surgeon: Rogene [place], MD;  [facility];  Service: Endoscopy;  Laterality: N/A;  . ESOPHAGEAL BANDING  06/15/2020   Procedure: ESOPHAGEAL BANDING;  Surgeon: Harvel Quale, MD;  [facility];  Service: Gastroenterology;;  . ESOPHAGOGASTRODUODENOSCOPY N/A 04/01/2013   Procedure: ESOPHAGOGASTRODUODENOSCOPY (EGD);  Surgeon: Rogene [place], MD;  [facility];  Service: Endoscopy;  Laterality: N/A;  230-rescheduled to 8:30am Ann notified pt  . ESOPHAGOGASTRODUODENOSCOPY N/A 05/24/2013   Procedure: ESOPHAGOGASTRODUODENOSCOPY (EGD);  Surgeon: Rogene [place], MD;  [facility];  Service: Endoscopy;  Laterality: N/A;  730  . ESOPHAGOGASTRODUODENOSCOPY N/A 06/21/2014   Procedure: ESOPHAGOGASTRODUODENOSCOPY (EGD);  Surgeon: Rogene [place], MD;  [facility];  Service: Endoscopy;  Laterality: N/A;  930-rescheduled 10/14 @ 1200 Ann to notify pt  . ESOPHAGOGASTRODUODENOSCOPY (EGD) WITH PROPOFOL N/A 06/15/2020   Procedure: ESOPHAGOGASTRODUODENOSCOPY (EGD) WITH PROPOFOL;  Surgeon: Harvel Quale, MD;  [facility];  Service: Gastroenterology;  Laterality: N/A;  230  . ESOPHAGOGASTRODUODENOSCOPY (EGD) WITH PROPOFOL N/A 07/27/2020   Procedure: ESOPHAGOGASTRODUODENOSCOPY (EGD) WITH PROPOFOL;  Surgeon: Harvel Quale, MD;  [facility];  Service: Gastroenterology;  Laterality: N/A;  1245  . EYE SURGERY  08   cataract surgery of the left eye  . FEMUR IM NAIL Left 03/02/2017   Procedure: INTRAMEDULLARY (IM) NAIL FEMORAL;  Surgeon: Rod Can, MD;  Location: [place];  Service: Orthopedics;  Laterality: Left;  . HARDWARE REMOVAL Right 01/17/2013   Procedure: REMOVAL OF HARDWARE AND EXCISION ULNAR STYLOID RIGHT WRIST;  Surgeon: Tennis Must, MD;  Location: [facility];  Service: Orthopedics;  Laterality: Right;  . implanted heart monitor  02/2020   to monitor Afib  . MYRINGOTOMY  2012   both ears  . ORIF FEMUR FRACTURE Left 04/23/2017   Procedure: OPEN REDUCTION INTERNAL FIXATION (ORIF) DISTAL FEMUR FRACTURE (FRACTURE AROUND FEMORAL NAIL);  Surgeon: Altamese [place], MD;  Location: [facility];  Service: Orthopedics;  Laterality: Left;  . TONSILLECTOMY    . TUBAL LIGATION    . VAGINAL HYSTERECTOMY  1972  . WRIST SURGERY     rt wrist hardwear removal     Allergies  Allergen Reactions  . Tape Rash and Other (See Comments)    Please use paper tape      Family History  Problem Relation Age of Onset  . Diabetes Mother   . Heart failure Father   . Heart failure Maternal Aunt      Social History Ms. Thornberry reports that she has been smoking cigarettes. She has a 12.50 pack-year smoking history. She has never used smokeless tobacco. Ms. Kulesza reports no history of alcohol use.   Review of Systems CONSTITUTIONAL: No weight loss, fever, chills, weakness or fatigue.  HEENT: Eyes: No visual loss, blurred vision, double vision or yellow sclerae.No hearing loss, sneezing, congestion, runny nose or sore throat.  SKIN: No rash or itching.  CARDIOVASCULAR: per hpi RESPIRATORY: No shortness of breath, cough or sputum.  GASTROINTESTINAL: No anorexia, nausea, vomiting or diarrhea. No abdominal pain or blood.  GENITOURINARY: No burning on urination, no polyuria NEUROLOGICAL: No headache, dizziness, syncope,  paralysis, ataxia, numbness or tingling in the extremities. No change in bowel or bladder control.  MUSCULOSKELETAL: No muscle, back pain, joint pain or stiffness.  LYMPHATICS: No enlarged nodes. No history of  splenectomy.  PSYCHIATRIC: No history of depression or anxiety.  ENDOCRINOLOGIC: No reports of sweating, cold or heat intolerance. No polyuria or polydipsia.  Marland Kitchen   Physical Examination Today's Vitals   01/10/21 0805  BP: (!) 150/82  Pulse: 90  SpO2: 98%  Weight: 154 lb 6.4 oz (70 kg)  Height: 5' 3"  (1.6 m)   Body mass index is 27.35 kg/m.  Gen: resting comfortably, no acute distress HEENT: no scleral icterus, pupils equal round and reactive, no palptable cervical adenopathy,  CV: RRR, 3/6 systoolic murmuru rusb, no jvd Resp: Clear to auscultation bilaterally GI: abdomen is soft, non-tender, non-distended, normal bowel sounds, no hepatosplenomegaly MSK: extremities are warm, no edema.  Skin: warm, no rash Neuro:  no focal deficits Psych: appropriate affect   Diagnostic Studies 06/2018 echo Study Conclusions  - Left ventricle: The cavity size was normal. Wall thickness was increased in a pattern of moderate LVH. Systolic function was vigorous. The estimated ejection fraction was in the range of 65% to 70%. Features are consistent with a pseudonormal left ventricular filling pattern, with concomitant abnormal relaxation and increased filling pressure (grade 2 diastolic dysfunction). - Aortic valve: Mildly calcified annulus. There was moderate to severe regurgitation. - Mitral valve: There was mild to moderate regurgitation. - Left atrium: The atrium was severely dilated.  Impressions:  - Technically difficult echo No parasternal views.  LV systolic function The aortic insufficiency is at least moderate - severe in severity .   08/2018 carotid US Summary: Right Carotid: Velocities in the right ICA are consistent with a 1-39%  stenosis.  Left Carotid: Velocities in the left ICA are consistent with a 1-39% stenosis, calcific plaque may obscure higher velicity.  12/2019 echo 1. Left ventricular ejection fraction, by estimation, is 60 to 65%. The  left ventricle has normal function. The left ventricle has no regional  wall motion abnormalities. There is mild left ventricular hypertrophy.  Left ventricular diastolic parameters  are consistent with Grade II diastolic dysfunction (pseudonormalization).  Elevated left atrial pressure.  2. Right ventricular systolic function is normal. The right ventricular  size is normal. There is mildly elevated pulmonary artery systolic  pressure.  3. Left atrial size was severely dilated.  4. The mitral valve was not well visualized. Mild mitral valve  regurgitation. Moderate mitral stenosis.MV peak gradient, 13.4 mmHg. The  mean mitral valve gradient is 5.0 mmHg.  5. The aortic valve was not well visualized. Aortic valve regurgitation  is moderate. Moderate aortic valve stenosis.  6. The inferior vena cava is normal in size with greater than 50%  respiratory variability, suggesting right atrial pressure of 3 mmHg.     Assessment and Plan  1. Afib/prior PSVT - symptosm at times but mixed compliance with meds, encouraged increased compliance with medciations. Continue anticoag   2. CAD -isolated episode of SOB in setting of being very upset, no recurrence - monitor at this time, if recurrences would consider lexiscan  3.Aortic stenosis - repeat echo for continued surveillance.   F/u 6 months    Arnoldo Lenis, M.D.

## 2021-01-10 NOTE — Patient Instructions (Signed)
Medication Instructions:  Your physician recommends that you continue on your current medications as directed. Please refer to the Current Medication list given to you today.  *If you need a refill on your cardiac medications before your next appointment, please call your pharmacy*   Lab Work: Requested lab work form you Primary Care Physician  If you have labs (blood work) drawn today and your tests are completely normal, you will receive your results only by: Marland Kitchen MyChart Message (if you have MyChart) OR . A paper copy in the mail If you have any lab test that is abnormal or we need to change your treatment, we will call you to review the results.   Testing/Procedures: Your physician has requested that you have an echocardiogram. Echocardiography is a painless test that uses sound waves to create images of your heart. It provides your doctor with information about the size and shape of your heart and how well your heart's chambers and valves are working. This procedure takes approximately one hour. There are no restrictions for this procedure.     Follow-Up: At [facility], you and your health needs are our priority.  As part of our continuing mission to provide you with exceptional heart care, we have created designated Provider Care Teams.  These Care Teams include your primary Cardiologist (physician) and Advanced Practice Providers (APPs -  Physician Assistants and Nurse Practitioners) who all work together to provide you with the care you need, when you need it.  We recommend signing up for the patient portal called "MyChart".  Sign up information is provided on this After Visit Summary.  MyChart is used to connect with patients for Virtual Visits (Telemedicine).  Patients are able to view lab/test results, encounter notes, upcoming appointments, etc.  Non-urgent messages can be sent to your provider as well.   To learn more about what you can do with MyChart, go to  [email or web address].    Your next appointment:   6 month(s)  The format for your next appointment:   In Person  Provider:    Carlyle Dolly, MD      Other Instructions

## 2021-01-11 IMAGING — DX DG LUMBAR SPINE 2-3V
3 series · 3 of 3 positions shown · non-contrast
Comparison: 01/11/2018 CT abdomen pelvis

CLINICAL DATA: Lower back pain

EXAM:
LUMBAR SPINE - 2-3 VIEW

[l-spine ap]
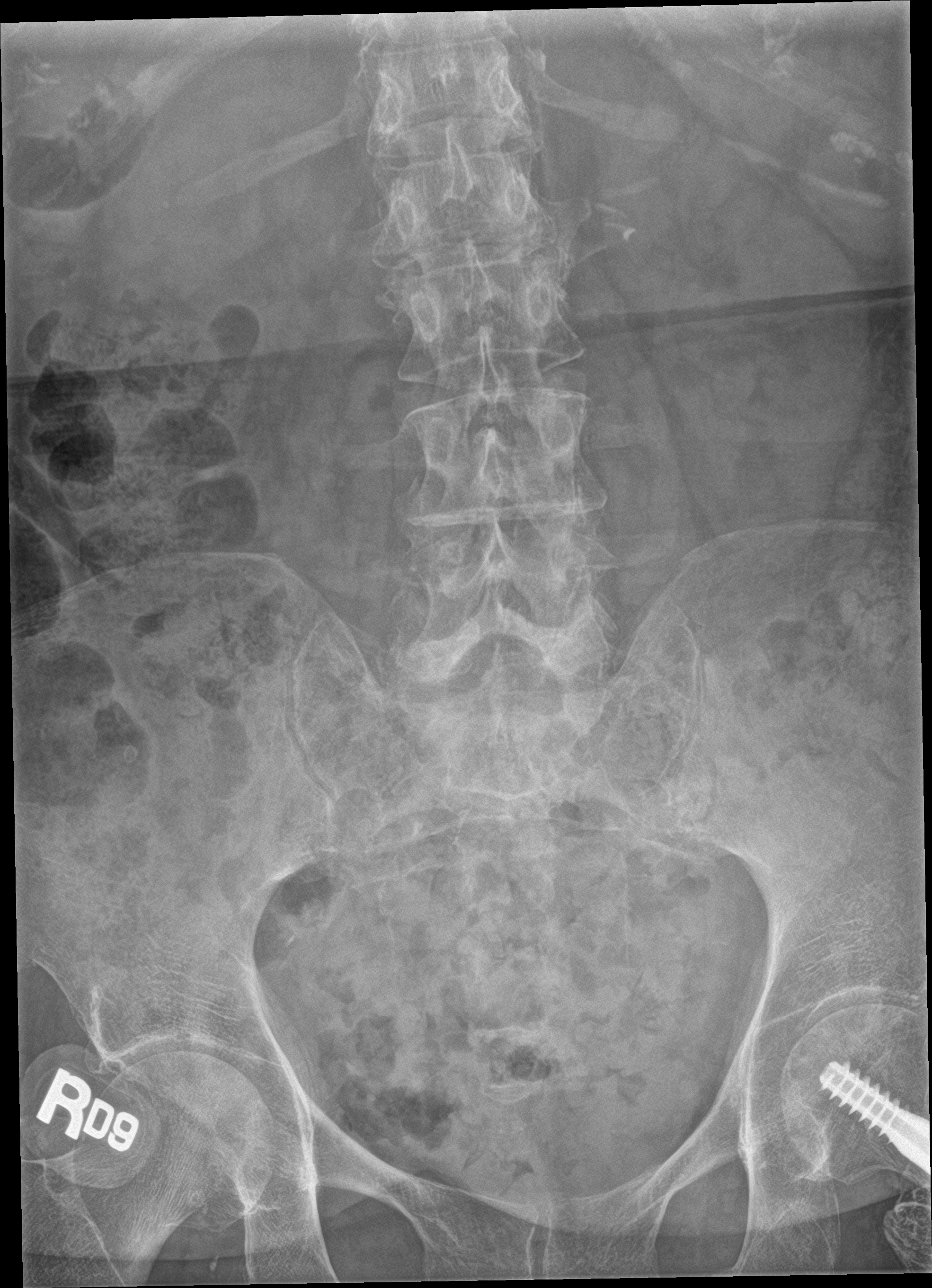

[l-spine lat]
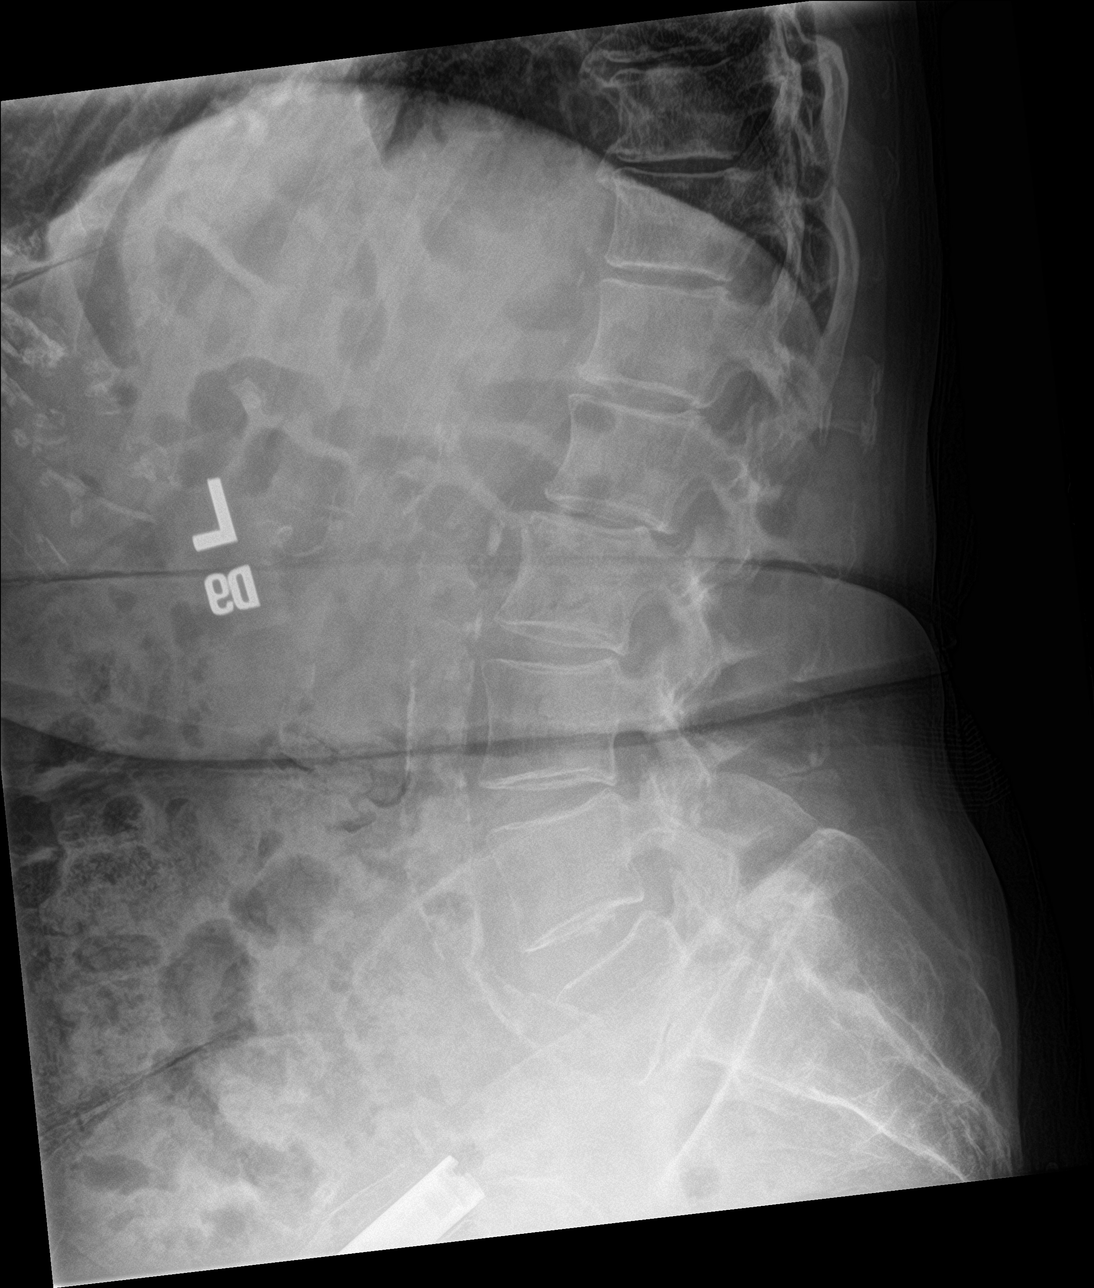

[l-spine spot]
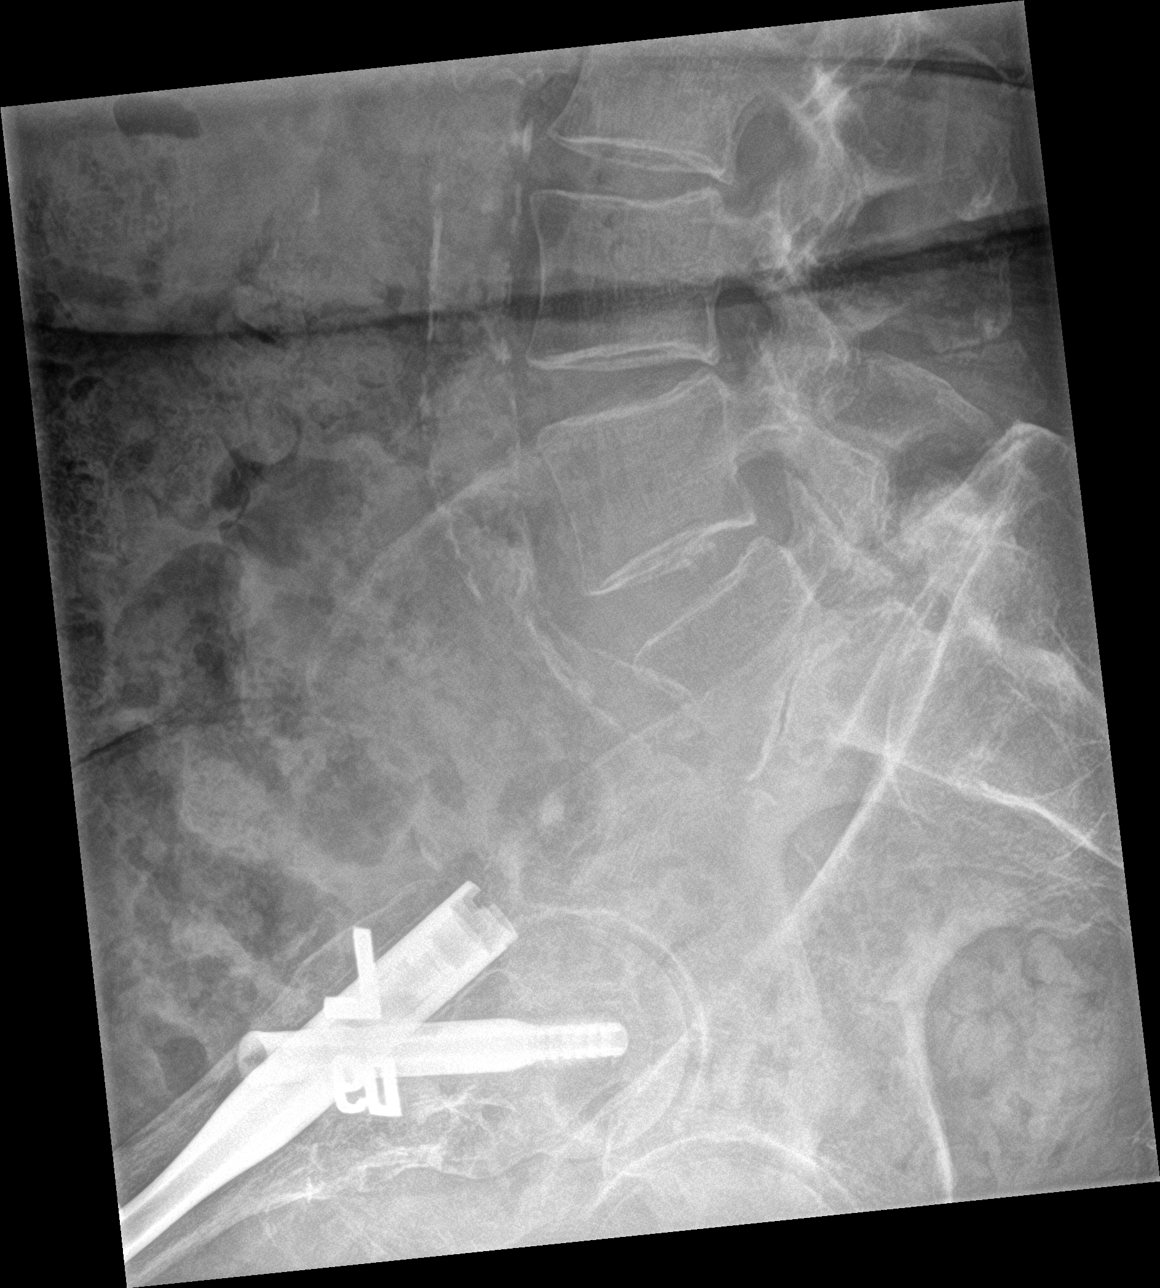

[3 of 3 positions shown; findings below may reference images not displayed]

FINDINGS: No acute osseous abnormality. No focal osseous lesion. Grade 1 L1-2,
L2-3 retrolisthesis. Bilateral L5-S1 pars defects with grade 2
anterolisthesis.

Vertebral body heights are preserved. Multilevel degenerative
changes including endplate sclerosis, osteophytosis and disc space
loss most prominent at the L1-2 and L5-S1 levels. Multilevel facet
hypertrophy.

Aortic atherosclerotic calcifications. Partially imaged left femoral
fixation.
IMPRESSION: 1. No acute osseous abnormality. Multilevel spondylosis most
prominent at the L1-2 and L5-S1 levels.
2. Bilateral L5-S1 pars defects with grade 2 anterolisthesis.

## 2021-01-11 IMAGING — DX DG HIP (WITH OR WITHOUT PELVIS) 2-3V*L*
4 series · 4 of 4 positions shown · non-contrast
Comparison: 09/10/2017

CLINICAL DATA: Left hip pain common no known injury, initial
encounter

EXAM:
DG HIP (WITH OR WITHOUT PELVIS) 2-3V LEFT

[pelvis ap]
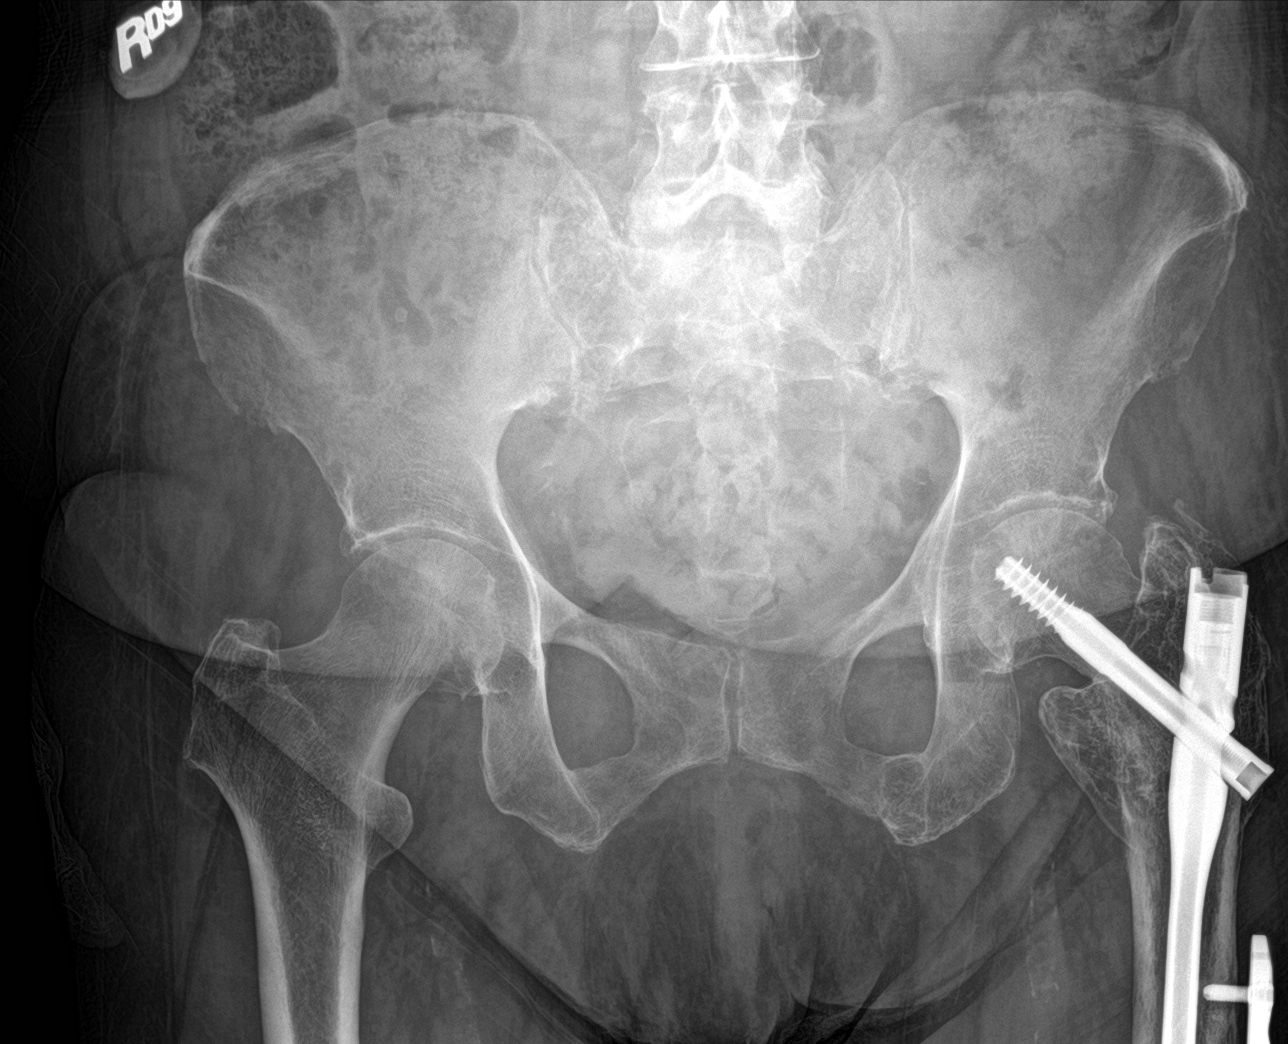

[hip ap (1 of 2)]
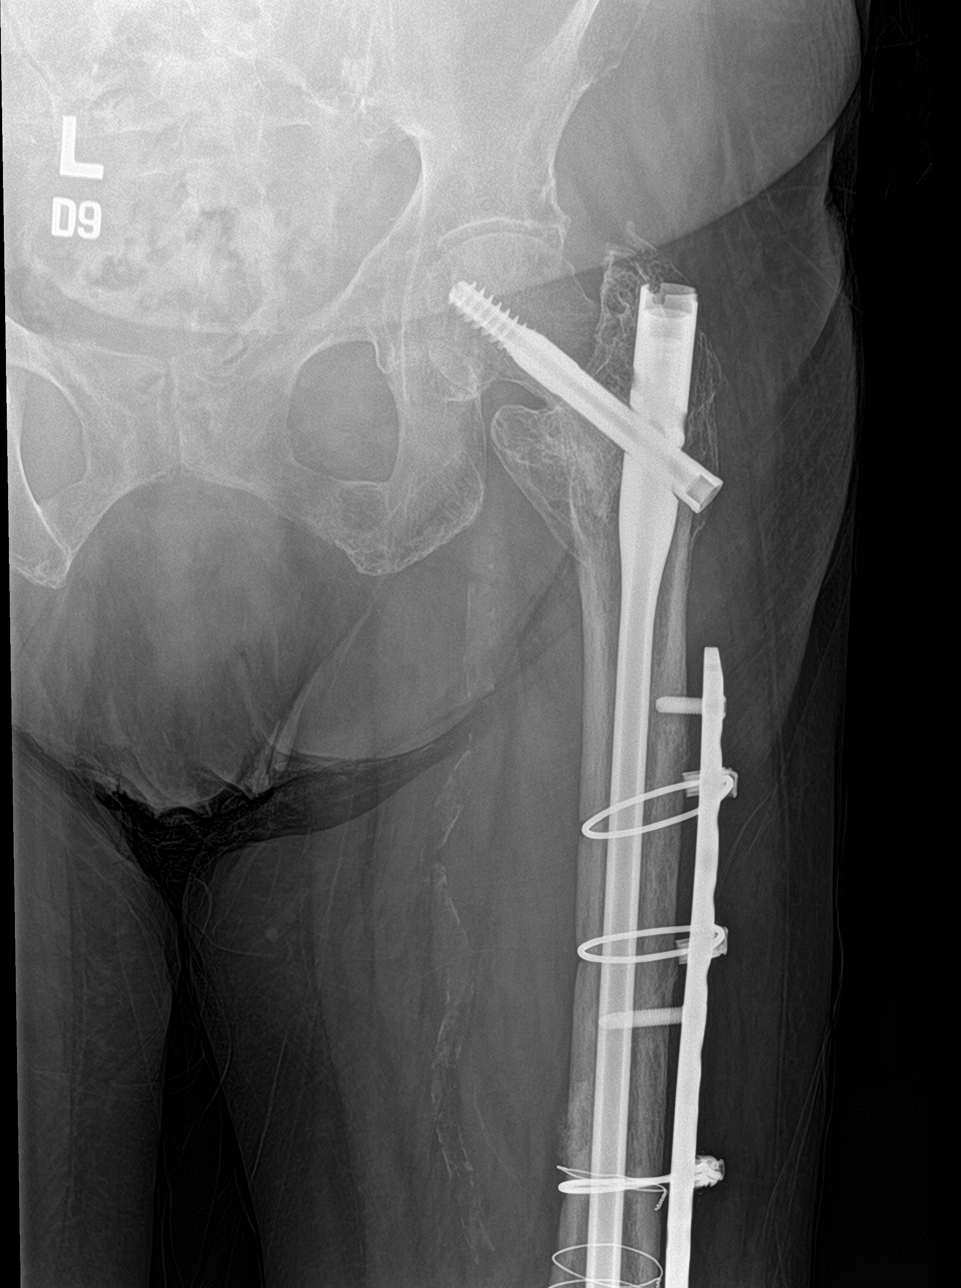

[hip frog leg]
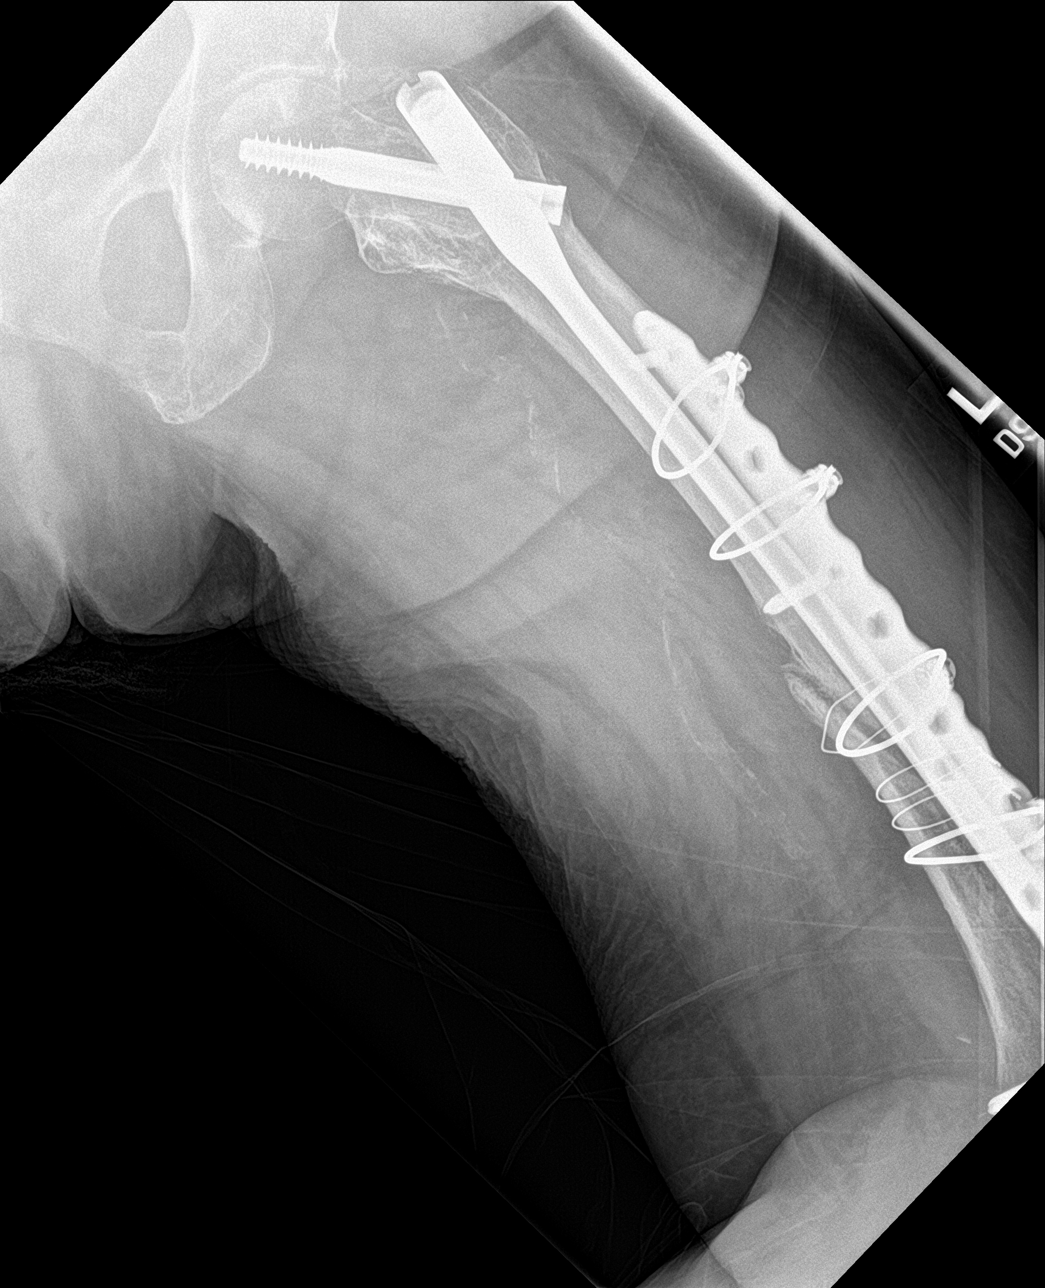

[hip ap (2 of 2)]
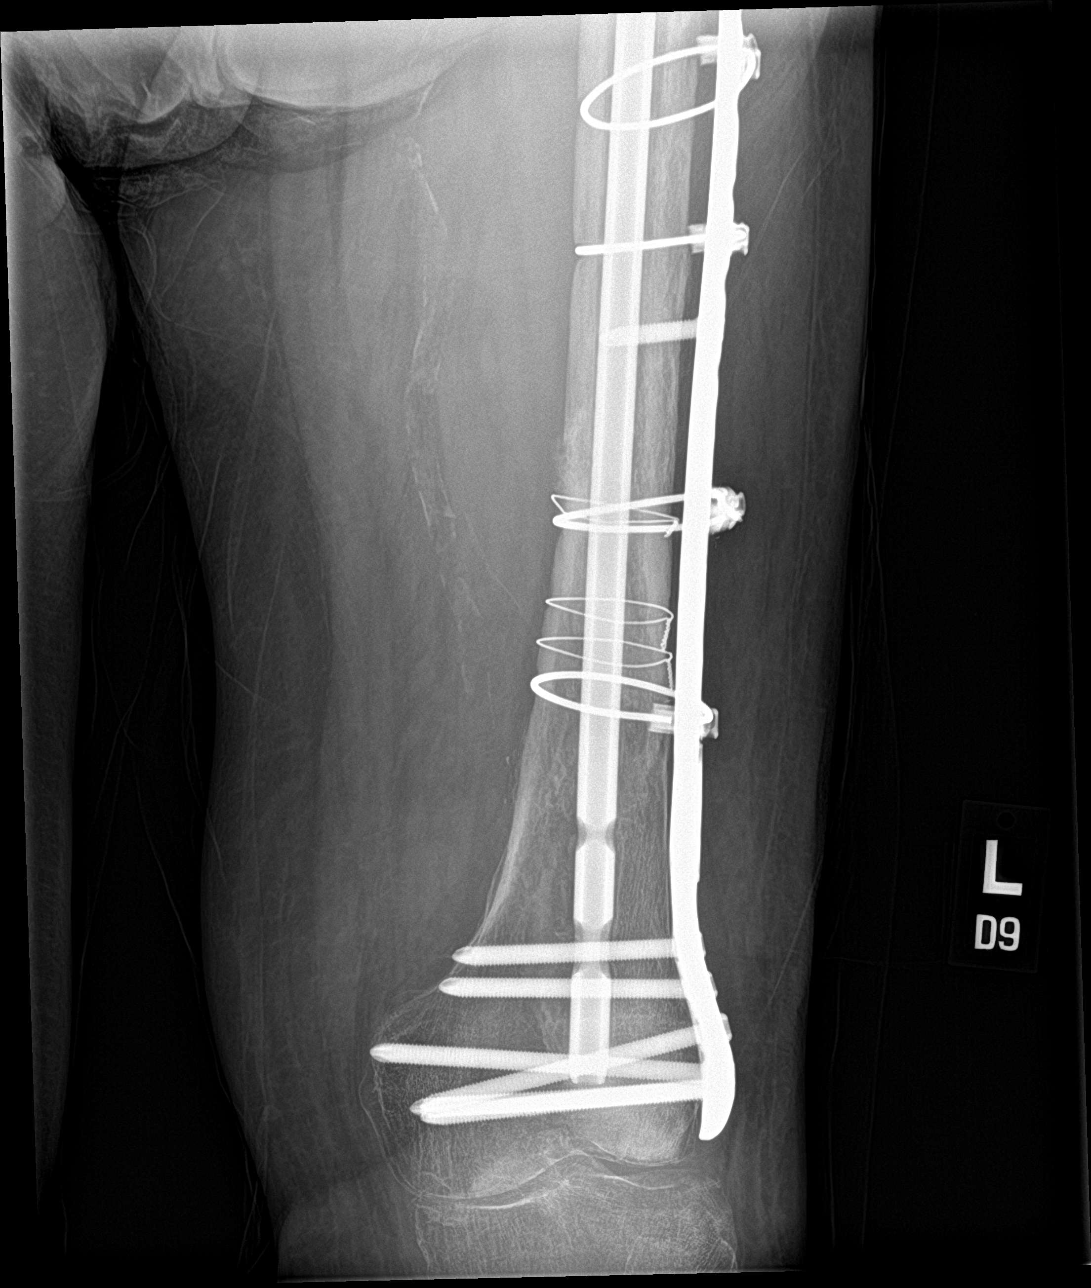

[4 of 4 positions shown; findings below may reference images not displayed]

FINDINGS: Postsurgical changes are again noted in the proximal left femur. No
acute fracture is seen. Fixation sideplate is noted along the
midshaft of the femur as well. Old fracture is noted. Pelvic ring
appears intact. No other focal abnormality is noted.
IMPRESSION: Postsurgical changes in the left femur.  No acute abnormality noted.

## 2021-01-15 NOTE — Progress Notes (Signed)
Carelink Summary Report / Loop Recorder 

## 2021-01-16 DIAGNOSIS — D698 Other specified hemorrhagic conditions: Secondary | ICD-10-CM | POA: Diagnosis not present

## 2021-01-28 ENCOUNTER — Ambulatory Visit (INDEPENDENT_AMBULATORY_CARE_PROVIDER_SITE_OTHER): Payer: Medicare Other

## 2021-01-28 DIAGNOSIS — I639 Cerebral infarction, unspecified: Secondary | ICD-10-CM

## 2021-01-29 ENCOUNTER — Encounter: Payer: Self-pay | Admitting: Cardiology

## 2021-01-29 DIAGNOSIS — E7849 Other hyperlipidemia: Secondary | ICD-10-CM | POA: Diagnosis not present

## 2021-01-29 DIAGNOSIS — E039 Hypothyroidism, unspecified: Secondary | ICD-10-CM | POA: Diagnosis not present

## 2021-01-29 DIAGNOSIS — R519 Headache, unspecified: Secondary | ICD-10-CM | POA: Diagnosis not present

## 2021-01-29 DIAGNOSIS — E559 Vitamin D deficiency, unspecified: Secondary | ICD-10-CM | POA: Diagnosis not present

## 2021-01-29 DIAGNOSIS — I700 Atherosclerosis of aorta: Secondary | ICD-10-CM | POA: Diagnosis not present

## 2021-01-29 DIAGNOSIS — Z1389 Encounter for screening for other disorder: Secondary | ICD-10-CM | POA: Diagnosis not present

## 2021-01-29 DIAGNOSIS — I350 Nonrheumatic aortic (valve) stenosis: Secondary | ICD-10-CM | POA: Diagnosis not present

## 2021-01-29 DIAGNOSIS — R1310 Dysphagia, unspecified: Secondary | ICD-10-CM | POA: Diagnosis not present

## 2021-01-29 DIAGNOSIS — I209 Angina pectoris, unspecified: Secondary | ICD-10-CM | POA: Diagnosis not present

## 2021-01-29 DIAGNOSIS — Z0001 Encounter for general adult medical examination with abnormal findings: Secondary | ICD-10-CM | POA: Diagnosis not present

## 2021-01-29 DIAGNOSIS — I4891 Unspecified atrial fibrillation: Secondary | ICD-10-CM | POA: Diagnosis not present

## 2021-01-29 DIAGNOSIS — E538 Deficiency of other specified B group vitamins: Secondary | ICD-10-CM | POA: Diagnosis not present

## 2021-01-30 ENCOUNTER — Other Ambulatory Visit (HOSPITAL_COMMUNITY): Payer: Self-pay | Admitting: Internal Medicine

## 2021-01-30 DIAGNOSIS — Z1231 Encounter for screening mammogram for malignant neoplasm of breast: Secondary | ICD-10-CM

## 2021-01-30 DIAGNOSIS — E2839 Other primary ovarian failure: Secondary | ICD-10-CM

## 2021-01-30 LAB — CUP PACEART REMOTE DEVICE CHECK
Date Time Interrogation Session: 20220523232349
Implantable Pulse Generator Implant Date: 20210602

## 2021-02-05 ENCOUNTER — Encounter: Payer: Self-pay | Admitting: *Deleted

## 2021-02-05 ENCOUNTER — Ambulatory Visit: Payer: Medicare Other | Admitting: Diagnostic Neuroimaging

## 2021-02-05 VITALS — BP 102/55 | HR 81 | Ht 63.0 in | Wt 156.0 lb

## 2021-02-05 DIAGNOSIS — M79605 Pain in left leg: Secondary | ICD-10-CM | POA: Diagnosis not present

## 2021-02-05 DIAGNOSIS — M25552 Pain in left hip: Secondary | ICD-10-CM

## 2021-02-05 DIAGNOSIS — E7849 Other hyperlipidemia: Secondary | ICD-10-CM | POA: Diagnosis not present

## 2021-02-05 DIAGNOSIS — M5442 Lumbago with sciatica, left side: Secondary | ICD-10-CM

## 2021-02-05 DIAGNOSIS — G8929 Other chronic pain: Secondary | ICD-10-CM

## 2021-02-05 DIAGNOSIS — G44209 Tension-type headache, unspecified, not intractable: Secondary | ICD-10-CM

## 2021-02-05 DIAGNOSIS — I10 Essential (primary) hypertension: Secondary | ICD-10-CM | POA: Diagnosis not present

## 2021-02-05 NOTE — Progress Notes (Signed)
[facility]  PATIENT: [name] DOB: [birth year]  REFERRING CLINICIAN: Redmond School, MD HISTORY FROM: patient  REASON FOR VISIT: new consult / est pt   HISTORICAL  CHIEF COMPLAINT:  Chief Complaint  Patient presents with  . Left leg symptoms    Rm 7  grand dgtr- [name] "feels like she had strokes in her sleep and is having migraines- new issue; still has leg issues"    HISTORY OF PRESENT ILLNESS:   UPDATE (02/05/21, VRP): Since last visit, doing well until 6 weeks ago --> 2 days of increased left leg weakness. Also had some headaches. Symptoms are improved spontaneously.   PRIOR HPI: 80 year old female here for evaluation of left leg pain and weakness.  History of strokes, atrial fibrillation, hypertension, hyperlipidemia.  Patient was hospitalized in April 2021 for recurrent embolic strokes, then found to have atrial fibrillation, now on Eliquis.  Since that time patient was doing well at home with home PT.  At some point home PT completed and patient was less active.  Following this she felt more left leg pain and weakness.  She feels pain in her left hip and left lower back region.   REVIEW OF SYSTEMS: Full 14 system review of systems performed and negative with exception of: As per HPI.  ALLERGIES: Allergies  Allergen Reactions  . Tape Rash and Other (See Comments)    Please use paper tape    HOME MEDICATIONS: Outpatient Medications Prior to Visit  Medication Sig Dispense Refill  . acetaminophen (TYLENOL) 500 MG tablet Take 1,000 mg by mouth every 6 (six) hours as needed for moderate pain.    Marland Kitchen atorvastatin (LIPITOR) 80 MG tablet Take 1 tablet (80 mg total) by mouth daily. 30 tablet 1  . azelastine (ASTELIN) 0.1 % nasal spray Place 1 spray into both nostrils 2 (two) times daily.    . calcium citrate (CALCITRATE - DOSED IN MG ELEMENTAL CALCIUM) 950 MG tablet Take 1 tablet (200 mg of elemental calcium total) by mouth daily. (Patient taking  differently: Take 0.5 tablets by mouth daily.) 30 tablet 1  . carvedilol (COREG) 6.25 MG tablet Take 1 tablet (6.25 mg total) by mouth 2 (two) times daily with a meal. 180 tablet 4  . Coenzyme Q10 100 MG capsule Take 100 mg by mouth daily.    Marland Kitchen diltiazem (CARDIZEM CD) 120 MG 24 hr capsule TAKE (1) CAPSULE BY MOUTH ONCE DAILY. (Patient taking differently: Take 120 mg by mouth daily.) 30 capsule 6  . ELIQUIS 5 MG TABS tablet TAKE (1) TABLET BY MOUTH TWICE DAILY. (Patient taking differently: Take 5 mg by mouth 2 (two) times daily.) 60 tablet 6  . furosemide (LASIX) 20 MG tablet Take 20 mg by mouth daily.    Marland Kitchen HYDROcodone-acetaminophen (NORCO) 10-325 MG tablet Take 1 tablet by mouth as needed for pain.    . methocarbamol (ROBAXIN) 500 MG tablet Take 500 mg by mouth 3 (three) times daily as needed for muscle spasms.    . mirtazapine (REMERON) 45 MG tablet Take 45 mg by mouth at bedtime.    . Multiple Vitamins-Minerals (MULTIVITAMIN GUMMIES ADULT PO) Take 2 tablets by mouth daily.    . nitroGLYCERIN (NITROSTAT) 0.4 MG SL tablet Place under the tongue.    Marland Kitchen omeprazole (PRILOSEC) 20 MG capsule Take 2 capsules (40 mg total) by mouth daily for 15 days. 90 capsule 4  . ondansetron (ZOFRAN) 4 MG tablet Take 4 mg by mouth every 8 (eight) hours as needed for  nausea or vomiting.    . polyethylene glycol powder (GLYCOLAX/MIRALAX) 17 GM/SCOOP powder Take 8.5 g by mouth daily. 255 g 0  . Propylene Glycol (SYSTANE COMPLETE OP) Place 1 drop into both eyes in the morning and at bedtime.    . mirtazapine (REMERON) 30 MG tablet Take 30 mg by mouth at bedtime.     No facility-administered medications prior to visit.    PAST MEDICAL HISTORY: Past Medical History:  Diagnosis Date  . Anemia   . Aortic insufficiency    Moderate  . Atrial fibrillation ([place])   . Back pain   . Carotid stenosis, bilateral   . Cirrhosis ([place])   . Closed left femoral fracture ([place]) 04/21/2017  . Coronary artery disease    Stent x 2  RCA 1995, cardiac catheterization 09/2016 showing only mild atherosclerosis  . DDD (degenerative disc disease), lumbar   . Diabetes mellitus without complication ([place])   . Essential hypertension   . Falls   . GERD (gastroesophageal reflux disease)   . Hiatal hernia   . History of stroke   . Hypercholesteremia   . IBS (irritable bowel syndrome)   . Non-alcoholic fatty liver disease   . OSA (obstructive sleep apnea)    no CPAP  . Pericardial effusion    a. small by echo 2018.  Marland Kitchen PSVT (paroxysmal supraventricular tachycardia) ([place])   . Recurrent UTI   . Seizures ([place]) 2018  . Stroke ([place]) 07/2019   several strokes within the last 10 years  . Swallowing difficulty   . Varices, esophageal ([place])     PAST SURGICAL HISTORY: Past Surgical History:  Procedure Laterality Date  . [place]  . APPENDECTOMY    . BACK SURGERY  1985  . CARDIAC CATHETERIZATION  [phone]   Stent to the proximal RCA after MI   . CARDIAC CATHETERIZATION N/A 09/26/2016   Procedure: Left Heart Cath and Coronary Angiography;  Surgeon: Leonie Man, MD;  Location: [place] [facility];  Service: Cardiovascular;  Laterality: N/A;  . CATARACT EXTRACTION W/PHACO Right 07/04/2014   Procedure: CATARACT EXTRACTION PHACO AND INTRAOCULAR LENS PLACEMENT (IOC);  Surgeon: Elta Guadeloupe T. Gershon Crane, MD;  Location: [facility];  Service: Ophthalmology;  Laterality: Right;  CDE:13.13  . COLONOSCOPY    . DILATION AND CURETTAGE OF UTERUS     x2  . Epi Retinal Membrane Peel Left   . ERCP    . ESOPHAGEAL BANDING N/A 04/01/2013   Procedure: ESOPHAGEAL BANDING;  Surgeon: Rogene Houston, MD;  [facility];  Service: Endoscopy;  Laterality: N/A;  . ESOPHAGEAL BANDING N/A 05/24/2013   Procedure: ESOPHAGEAL BANDING;  Surgeon: Rogene Houston, MD;  [facility];  Service: Endoscopy;  Laterality: N/A;  . ESOPHAGEAL BANDING N/A 06/21/2014   Procedure: ESOPHAGEAL BANDING;  Surgeon: Rogene Houston, MD;   [facility];  Service: Endoscopy;  Laterality: N/A;  . ESOPHAGEAL BANDING  06/15/2020   Procedure: ESOPHAGEAL BANDING;  Surgeon: Harvel Quale, MD;  [facility];  Service: Gastroenterology;;  . ESOPHAGOGASTRODUODENOSCOPY N/A 04/01/2013   Procedure: ESOPHAGOGASTRODUODENOSCOPY (EGD);  Surgeon: Rogene Houston, MD;  [facility];  Service: Endoscopy;  Laterality: N/A;  230-rescheduled to 8:30am Ann notified pt  . ESOPHAGOGASTRODUODENOSCOPY N/A 05/24/2013   Procedure: ESOPHAGOGASTRODUODENOSCOPY (EGD);  Surgeon: Rogene Houston, MD;  [facility];  Service: Endoscopy;  Laterality: N/A;  730  . ESOPHAGOGASTRODUODENOSCOPY N/A 06/21/2014   Procedure: ESOPHAGOGASTRODUODENOSCOPY (EGD);  Surgeon: Rogene Houston, MD;  [facility];  Service: Endoscopy;  Laterality: N/A;  930-rescheduled 10/14 @ 1200 Ann to notify pt  . ESOPHAGOGASTRODUODENOSCOPY (EGD) WITH PROPOFOL N/A 06/15/2020   Procedure: ESOPHAGOGASTRODUODENOSCOPY (EGD) WITH PROPOFOL;  Surgeon: Harvel Quale, MD;  [facility];  Service: Gastroenterology;  Laterality: N/A;  230  . ESOPHAGOGASTRODUODENOSCOPY (EGD) WITH PROPOFOL N/A 07/27/2020   Procedure: ESOPHAGOGASTRODUODENOSCOPY (EGD) WITH PROPOFOL;  Surgeon: Harvel Quale, MD;  [facility];  Service: Gastroenterology;  Laterality: N/A;  1245  . EYE SURGERY  08   cataract surgery of the left eye  . FEMUR IM NAIL Left 03/02/2017   Procedure: INTRAMEDULLARY (IM) NAIL FEMORAL;  Surgeon: Rod Can, MD;  Location: [facility];  Service: Orthopedics;  Laterality: Left;  . HARDWARE REMOVAL Right 01/17/2013   Procedure: REMOVAL OF HARDWARE AND EXCISION ULNAR STYLOID RIGHT WRIST;  Surgeon: Tennis Must, MD;  Location: [facility];  Service: Orthopedics;  Laterality: Right;  . implanted heart monitor  02/2020   to monitor Afib  . MYRINGOTOMY  2012   both ears  . ORIF FEMUR FRACTURE  Left 04/23/2017   Procedure: OPEN REDUCTION INTERNAL FIXATION (ORIF) DISTAL FEMUR FRACTURE (FRACTURE AROUND FEMORAL NAIL);  Surgeon: Altamese Newburyport, MD;  Location: [facility];  Service: Orthopedics;  Laterality: Left;  . TONSILLECTOMY    . TUBAL LIGATION    . VAGINAL HYSTERECTOMY  1972  . WRIST SURGERY     rt wrist hardwear removal    FAMILY HISTORY: Family History  Problem Relation Age of Onset  . Diabetes Mother   . Heart failure Father   . Heart failure Maternal Aunt     SOCIAL HISTORY: Social History   Socioeconomic History  . Marital status: Widowed    Spouse name: Not on file  . Number of children: 2  . Years of education: 2 years college  . Highest education level: Some college, no degree  Occupational History    Employer: RETIRED  Tobacco Use  . Smoking status: Smoker, Current Status Unknown    Packs/day: 0.25    Years: 50.00    Pack years: 12.50    Types: Cigarettes    Last attempt to quit: 11/03/2017    Years since quitting: 3.2  . Smokeless tobacco: Never Used  . Tobacco comment: 2 cigarettes daily  Vaping Use  . Vaping Use: Never used  Substance and Sexual Activity  . Alcohol use: No    Alcohol/week: 0.0 standard drinks  . Drug use: No  . Sexual activity: Not Currently    Birth control/protection: Surgical  Other Topics Concern  . Not on file  Social History Narrative   06/12/20 Lives with 3 grandchildren, sone and dgtr-in -law are across the st   Social Determinants of Health   Financial Resource Strain: Not on file  Food Insecurity: Not on file  Transportation Needs: No Transportation Needs  . Lack of Transportation (Medical): No  . Lack of Transportation (Non-Medical): No  Physical Activity: Not on file  Stress: Not on file  Social Connections: Not on file  Intimate Partner Violence: Not on file     PHYSICAL EXAM  GENERAL EXAM/CONSTITUTIONAL: Vitals:  Vitals:   02/05/21 1013  BP: (!) 102/55  Pulse: 81  Weight: 156 lb (70.8 kg)  Height:  5' 3"  (1.6 m)   Body mass index is 27.63 kg/m. Wt Readings from Last 3 Encounters:  02/05/21 156 lb (70.8 kg)  01/10/21 154  lb 6.4 oz (70 kg)  07/27/20 140 lb (63.5 kg)    Patient is in no distress; well developed, nourished and groomed; neck is supple  CARDIOVASCULAR:  Examination of carotid arteries is normal; no carotid bruits  Regular rate and rhythm, no murmurs  Examination of peripheral vascular system by observation and palpation is normal  EYES:  Ophthalmoscopic exam of optic discs and posterior segments is normal; no papilledema or hemorrhages No exam data present  MUSCULOSKELETAL:  Gait, strength, tone, movements noted in Neurologic exam below  NEUROLOGIC: MENTAL STATUS:  No flowsheet data found.  awake, alert, oriented to person, place and time  recent and remote memory intact  normal attention and concentration  language fluent, comprehension intact, naming intact  fund of knowledge appropriate  CRANIAL NERVE:   2nd - no papilledema on fundoscopic exam  2nd, 3rd, 4th, 6th - pupils equal and reactive to light, visual fields full to confrontation, extraocular muscles intact, no nystagmus  5th - facial sensation symmetric  7th - facial strength symmetric  8th - hearing intact  9th - palate elevates symmetrically, uvula midline  11th - shoulder shrug symmetric  12th - tongue protrusion midline  MOTOR:   normal bulk and tone, BUE 4 (EXCEPT RIGHT HAND WEAKNESS 2/5)  RLE 5 PROX, 4 DISTAL  LLE 3 PROX, 4 DISTAL  SENSORY:   normal and symmetric to light touch, temperature, vibration; EXCEPT DECR IN LEFT FOOT  COORDINATION:   finger-nose-finger, fine finger movements normal  REFLEXES:   deep tendon reflexes TRACE and symmetric  GAIT/STATION:   IN WHEEL CHAIR     DIAGNOSTIC DATA (LABS, IMAGING, TESTING) - I reviewed patient records, labs, notes, testing and imaging myself where available.  Lab Results  Component Value Date    WBC 5.1 07/26/2020   HGB 11.5 (L) 07/26/2020   HCT 35.6 (L) 07/26/2020   MCV 92.2 07/26/2020   PLT 144 (L) 07/26/2020      Component Value Date/Time   NA 142 07/27/2020 1231   K 3.5 07/27/2020 1231   CL 108 07/27/2020 1231   CO2 27 07/27/2020 1231   GLUCOSE 95 07/27/2020 1231   BUN 13 07/27/2020 1231   CREATININE 0.68 07/27/2020 1231   CREATININE 0.88 06/04/2020 1148   CALCIUM 8.5 (L) 07/27/2020 1231   CALCIUM 8.4 (L) 04/22/2017 0514   PROT 6.1 (L) 07/26/2020 1021   PROT 6.3 03/24/2018 1134   ALBUMIN 3.3 (L) 07/26/2020 1021   ALBUMIN 4.1 03/24/2018 1134   AST 31 07/26/2020 1021   ALT 22 07/26/2020 1021   ALKPHOS 122 07/26/2020 1021   BILITOT 1.5 (H) 07/26/2020 1021   BILITOT 0.5 03/24/2018 1134   GFRNONAA >60 07/27/2020 1231   GFRNONAA 63 06/04/2020 1148   GFRAA 73 06/04/2020 1148   Lab Results  Component Value Date   CHOL 125 06/13/2020   HDL 57 06/13/2020   LDLCALC 59 06/13/2020   TRIG 45 06/13/2020   CHOLHDL 2.2 06/13/2020   Lab Results  Component Value Date   HGBA1C 5.2 01/03/2020   Lab Results  Component Value Date   [id number] 034 01/04/2020   Lab Results  Component Value Date   TSH 0.813 01/04/2020    01/18/20 CT head [I reviewed images myself and agree with interpretation. -VRP]  1. No CT evidence of acute intracranial abnormality. 2. A few tiny supratentorial and infratentorial infarcts which were acute at time of prior MRI 01/03/2020 are not appreciable by CT. 3. Redemonstrated chronic right frontoparietal cortically  based infarct. 4. Unchanged chronic lacunar infarcts within the deep gray nuclei and right cerebellum. Stable background generalized parenchymal atrophy and chronic small vessel ischemic disease.  01/03/20 MRI brain [I reviewed images myself and agree with interpretation. -VRP]  - Few small acute to subacute infarcts involving multiple vascular territories. - Stable chronic findings detailed above.  06/20/20 xray lumbar 1. No  acute osseous abnormality. Multilevel spondylosis most prominent at the L1-2 and L5-S1 levels. 2. Bilateral L5-S1 pars defects with grade 2 anterolisthesis.  06/20/20 xray hip - Postsurgical changes in the left femur.  No acute abnormality noted.     ASSESSMENT AND PLAN  80 y.o. year old female here with:  Dx:  1. Chronic left-sided low back pain with left-sided sciatica   2. Left leg pain   3. Left hip pain   4. Tension headache      PLAN:  STROKE PREVENTION - eliquis, atorvastatin, BP control  LEFT HIP / LEG PAIN / LOW BACK PAIN - continue PT exercises  GAIT DIFFICULTY - continue PT exercises  Return for return to PCP.    Penni Bombard, MD 02/25/5092, 26:71 AM Certified in Neurology, Neurophysiology and Neuroimaging  [facility] [address] [place], [place] [address] [phone]

## 2021-02-12 ENCOUNTER — Encounter (HOSPITAL_COMMUNITY): Payer: Self-pay

## 2021-02-12 ENCOUNTER — Ambulatory Visit (HOSPITAL_COMMUNITY): Payer: Medicare Other

## 2021-02-18 ENCOUNTER — Ambulatory Visit (HOSPITAL_COMMUNITY)
Admission: RE | Admit: 2021-02-18 | Discharge: 2021-02-18 | Disposition: A | Discharge status: Home / Self Care | Payer: Medicare Other | Source: Ambulatory Visit | Attending: Internal Medicine | Admitting: Internal Medicine

## 2021-02-18 DIAGNOSIS — E2839 Other primary ovarian failure: Secondary | ICD-10-CM | POA: Diagnosis present

## 2021-02-18 DIAGNOSIS — M85851 Other specified disorders of bone density and structure, right thigh: Secondary | ICD-10-CM | POA: Diagnosis not present

## 2021-02-18 DIAGNOSIS — Z780 Asymptomatic menopausal state: Secondary | ICD-10-CM | POA: Diagnosis not present

## 2021-02-18 DIAGNOSIS — M85832 Other specified disorders of bone density and structure, left forearm: Secondary | ICD-10-CM | POA: Diagnosis not present

## 2021-02-18 NOTE — Progress Notes (Signed)
Carelink Summary Report / Loop Recorder 

## 2021-02-20 ENCOUNTER — Other Ambulatory Visit: Payer: Self-pay

## 2021-02-20 ENCOUNTER — Ambulatory Visit (HOSPITAL_COMMUNITY)
Admission: RE | Admit: 2021-02-20 | Discharge: 2021-02-20 | Disposition: A | Discharge status: Home / Self Care | Payer: Medicare Other | Source: Ambulatory Visit | Attending: Cardiology | Admitting: Cardiology

## 2021-02-20 DIAGNOSIS — I350 Nonrheumatic aortic (valve) stenosis: Secondary | ICD-10-CM

## 2021-02-20 LAB — ECHOCARDIOGRAM COMPLETE
AR max vel: 0.8 cm2
AV Area VTI: 0.87 cm2
AV Area mean vel: 0.77 cm2
AV Mean grad: 25.8 mmHg
AV Peak grad: 45 mmHg
Ao pk vel: 3.35 m/s
Area-P 1/2: 2.8 cm2
MV VTI: 1.02 cm2
P 1/2 time: 331 msec
S' Lateral: 2.6 cm

## 2021-02-20 NOTE — Progress Notes (Signed)
*  PRELIMINARY RESULTS* Echocardiogram 2D Echocardiogram has been performed.  Elpidio Anis 02/20/2021, 2:29 PM

## 2021-02-28 ENCOUNTER — Ambulatory Visit (INDEPENDENT_AMBULATORY_CARE_PROVIDER_SITE_OTHER): Payer: Medicare Other

## 2021-02-28 DIAGNOSIS — I639 Cerebral infarction, unspecified: Secondary | ICD-10-CM

## 2021-03-04 LAB — CUP PACEART REMOTE DEVICE CHECK
Date Time Interrogation Session: 20220625233115
Implantable Pulse Generator Implant Date: 20210602

## 2021-03-05 ENCOUNTER — Other Ambulatory Visit: Payer: Self-pay | Admitting: *Deleted

## 2021-03-05 NOTE — Patient Outreach (Signed)
[facility] Care Management  [place]  03/05/2021   [name] [birth year] [id number]  Subjective: Successful telephone outreach call to patient. HIPAA identifiers obtained. Patient states that she did receive the calendar booklet and hypertension education. She reports that she has not taken her B/P the last couple of days, she does plan to start to do better, and will start to take her B/P daily and record the values. Patient explains that her B/P values have been good. Typically in the 353'G systolic over 99'M diastolic. She states that she does try to limit the salt in her diet. Patient reports that she is doing chair exercises, PT exercises, and walking the length of the house daily. Nurse encouraged patient to increase the amount of time she does her PT/chair exercises slowly and as tolerated to help increase her strength and mobility. Discussed increasing the amount of walking she does by walking the length of the house twice a day. Patient states she has not had any recent falls, denies any needs for DME, and is well supported by her granddaughter Gari Crown and son [name]. Patient did not have any further questions or concerns today and did confirm that she has this nurse's contact number to call her if needed.    Encounter Medications:  Outpatient Encounter Medications as of 03/05/2021  Medication Sig Note   acetaminophen (TYLENOL) 500 MG tablet Take 1,000 mg by mouth every 6 (six) hours as needed for moderate pain.    atorvastatin (LIPITOR) 80 MG tablet Take 1 tablet (80 mg total) by mouth daily.    azelastine (ASTELIN) 0.1 % nasal spray Place 1 spray into both nostrils 2 (two) times daily.    calcium citrate (CALCITRATE - DOSED IN MG ELEMENTAL CALCIUM) 950 MG tablet Take 1 tablet (200 mg of elemental calcium total) by mouth daily. (Patient taking differently: Take 0.5 tablets by mouth daily.)    carvedilol (COREG) 6.25 MG tablet Take 1 tablet (6.25 mg total)  by mouth 2 (two) times daily with a meal.    Coenzyme Q10 100 MG capsule Take 100 mg by mouth daily.    diltiazem (CARDIZEM CD) 120 MG 24 hr capsule TAKE (1) CAPSULE BY MOUTH ONCE DAILY. (Patient taking differently: Take 120 mg by mouth daily.)    ELIQUIS 5 MG TABS tablet TAKE (1) TABLET BY MOUTH TWICE DAILY. (Patient taking differently: Take 5 mg by mouth 2 (two) times daily.)    furosemide (LASIX) 20 MG tablet Take 20 mg by mouth daily. 02/05/2021: As needed   mirtazapine (REMERON) 45 MG tablet Take 45 mg by mouth at bedtime.    Multiple Vitamins-Minerals (MULTIVITAMIN GUMMIES ADULT PO) Take 2 tablets by mouth daily.    nitroGLYCERIN (NITROSTAT) 0.4 MG SL tablet Place under the tongue.    ondansetron (ZOFRAN) 4 MG tablet Take 4 mg by mouth every 8 (eight) hours as needed for nausea or vomiting.    polyethylene glycol powder (GLYCOLAX/MIRALAX) 17 GM/SCOOP powder Take 8.5 g by mouth daily. 02/05/2021: As needed   Propylene Glycol (SYSTANE COMPLETE OP) Place 1 drop into both eyes in the morning and at bedtime.    HYDROcodone-acetaminophen (NORCO) 10-325 MG tablet Take 1 tablet by mouth as needed for pain. (Patient not taking: Reported on 03/05/2021) 03/05/2021: Patient is not taking   methocarbamol (ROBAXIN) 500 MG tablet Take 500 mg by mouth 3 (three) times daily as needed for muscle spasms. (Patient not taking: Reported on 03/05/2021) 03/05/2021: Patient is not taking   omeprazole (  PRILOSEC) 20 MG capsule Take 2 capsules (40 mg total) by mouth daily for 15 days.    No facility-administered encounter medications on file as of 03/05/2021.    Functional Status:  In your present state of health, do you have any difficulty performing the following activities: 07/26/2020 06/13/2020  Hearing? N N  Vision? N N  Difficulty concentrating or making decisions? N N  Walking or climbing stairs? N N  Dressing or bathing? N N  Doing errands, shopping? N N  Some recent data might be hidden    Fall/Depression  Screening: Fall Risk  03/05/2021 02/05/2021 11/29/2020  Falls in the past year? 1 0 1  Comment - - -  Number falls in past yr: 1 - 1  Injury with Fall? 1 - 1  Risk Factor Category  - - -  Risk for fall due to : Impaired mobility;Impaired balance/gait;History of fall(s) - Impaired mobility;Impaired balance/gait;History of fall(s)  Risk for fall due to: Comment - - -  Follow up Falls evaluation completed;Education provided;Falls prevention discussed - Falls prevention discussed;Education provided;Falls evaluation completed   PHQ 2/9 Scores 08/29/2020 03/16/2019 03/16/2019 07/29/2018 05/13/2018 12/28/2017 09/15/2017  PHQ - 2 Score 1 0 0 0 1 0 1  PHQ- 9 Score 4 - - 1 - - -    Assessment:   Care Plan There are no care plans that you recently modified to display for this patient.    Goals Addressed             This Visit's Progress    [facility]) Patient will verbalize continuation of doing PT exercises routinely for the next 90 days       Timeframe:  Long-Range Goal Priority:  High Start Date:  08/29/20                           Expected End Date: 09/06/21 Follow Up Date: 07/07/21  -Encourage continuation of doing PT exercises routinely -Discussed doing chair exercises -Discussed increasing the amount of walking she does by walking the length of the house twice a day. -Encouraged patient to increase the amount of time she does her PT/chair exercises slowly and as tolerated to help increase her strength and mobility  Notes: Patient stated goal is to increase her mobility and strength. She plans to do this by continuing to do physical therapy exercises several times weekly at home, and start to do some chair exercises.                      Updated 11/29/20: Patient reports she did not receive the chair exercise printouts. Nurse apologized and will send chair exercise printouts for the patient to follow.  Updated 03/05/21: Patient reports that she is doing chair exercises, PT exercises, and walking  the length of the house daily. Nurse encouraged patient to increase the amount of time she does her PT/chair exercises slowly and as tolerated to help increase her strength and mobility. Discussed increasing the amount of walking she does by walking the length of the house twice a day.      [facility]) Patient will verbalize monitoring her B/P and recording the values daily within the next 90 days       Timeframe:  Long-Range Goal Priority:  High Start Date: 08/29/20                            Expected  End Date:09/06/21                   Follow Up Date 07/07/21   - check blood pressure daily - choose a place to take my blood pressure (home, clinic or office, retail store) - write blood pressure results in a log or diary  -Encouraged patient to begin to record her B/P values to be able to monitor her trends -Encouraged patient to continue to eat a heart healthy, low sodium diet  Why is this important?   You won't feel high blood pressure, but it can still hurt your blood vessels.  High blood pressure can cause heart or kidney problems. It can also cause a stroke.  Making lifestyle changes like losing a little weight or eating less salt will help.  Checking your blood pressure at home and at different times of the day can help to control blood pressure.  If the doctor prescribes medicine remember to take it the way the doctor ordered.  Call the office if you cannot afford the medicine or if there are questions about it.     Notes: Patient explained she does check her B/P daily but is not writing down the values. Patient states she will start to record her B/P values. Nurse did discuss the importance of writing down the values in a log so the patient and her provider can easily see her values and determine if her B/P is under control. Nurse will send the patient hypertension education and a calendar booklet for her to record her values.   Updated 11/29/20: Patient states that she did not receive the  calendar booklet or hypertension education. Nurse apologize and will send the materials. Patient reports taking her B/P daily but not recording the values. Nurse did discuss the importance of recording her values to be able to monitor her trends and have the values available for her doctor to evaluate her B/P control.  Updated 03/05/21: Patient states that she did receive the calendar booklet and hypertension education. She reports that she has not taken her B/P the last couple of days, she does plan to start to do better, and will start to take her B/P daily and record the values. Patient explains that her B/P values have been good. Typically in the 903'Y systolic over 33'X diastolic. She states that she does try to limit the salt in her diet.         Plan: RN Health Coach will send PCP  a quarterly update and will call patient within the month of September. Follow-up: Patient agrees to Care Plan and Follow-up.  Emelia Loron RN, BSN [place] [phone] [email or web address]

## 2021-03-05 NOTE — Patient Instructions (Addendum)
Goals Addressed             This Visit's Progress    [facility]) Patient will verbalize continuation of doing PT exercises routinely for the next 90 days       Timeframe:  Long-Range Goal Priority:  High Start Date:  08/29/20                           Expected End Date: 09/06/21 Follow Up Date: 07/07/21  -Encourage continuation of doing PT exercises routinely -Discussed doing chair exercises -Discussed increasing the amount of walking she does by walking the length of the house twice a day. -Encouraged patient to increase the amount of time she does her PT/chair exercises slowly and as tolerated to help increase her strength and mobility  Notes: Patient stated goal is to increase her mobility and strength. She plans to do this by continuing to do physical therapy exercises several times weekly at home, and start to do some chair exercises.                      Updated 11/29/20: Patient reports she did not receive the chair exercise printouts. Nurse apologized and will send chair exercise printouts for the patient to follow.  Updated 03/05/21: Patient reports that she is doing chair exercises, PT exercises, and walking the length of the house daily. Nurse encouraged patient to increase the amount of time she does her PT/chair exercises slowly and as tolerated to help increase her strength and mobility. Discussed increasing the amount of walking she does by walking the length of the house twice a day.      [facility]) Patient will verbalize monitoring her B/P and recording the values daily within the next 90 days       Timeframe:  Long-Range Goal Priority:  High Start Date: 08/29/20                            Expected End Date:09/06/21                   Follow Up Date 07/07/21   - check blood pressure daily - choose a place to take my blood pressure (home, clinic or office, retail store) - write blood pressure results in a log or diary  -Encouraged patient to begin to record her B/P values to  be able to monitor her trends -Encouraged patient to continue to eat a heart healthy, low sodium diet  Why is this important?   You won't feel high blood pressure, but it can still hurt your blood vessels.  High blood pressure can cause heart or kidney problems. It can also cause a stroke.  Making lifestyle changes like losing a little weight or eating less salt will help.  Checking your blood pressure at home and at different times of the day can help to control blood pressure.  If the doctor prescribes medicine remember to take it the way the doctor ordered.  Call the office if you cannot afford the medicine or if there are questions about it.     Notes: Patient explained she does check her B/P daily but is not writing down the values. Patient states she will start to record her B/P values. Nurse did discuss the importance of writing down the values in a log so the patient and her provider can easily see her values and determine if her  B/P is under control. Nurse will send the patient hypertension education and a calendar booklet for her to record her values.   Updated 11/29/20: Patient states that she did not receive the calendar booklet or hypertension education. Nurse apologize and will send the materials. Patient reports taking her B/P daily but not recording the values. Nurse did discuss the importance of recording her values to be able to monitor her trends and have the values available for her doctor to evaluate her B/P control.  Updated 03/05/21: Patient states that she did receive the calendar booklet and hypertension education. She reports that she has not taken her B/P the last couple of days, she does plan to start to do better, and will start to take her B/P daily and record the values. Patient explains that her B/P values have been good. Typically in the 211'H systolic over 41'D diastolic. She states that she does try to limit the salt in her diet.

## 2021-03-06 ENCOUNTER — Ambulatory Visit: Payer: Self-pay | Admitting: *Deleted

## 2021-03-07 DIAGNOSIS — I10 Essential (primary) hypertension: Secondary | ICD-10-CM | POA: Diagnosis not present

## 2021-03-07 DIAGNOSIS — E782 Mixed hyperlipidemia: Secondary | ICD-10-CM | POA: Diagnosis not present

## 2021-03-12 ENCOUNTER — Telehealth: Payer: Self-pay

## 2021-03-12 NOTE — Telephone Encounter (Signed)
Noted. We do not treat bradycardia while asleep.

## 2021-03-12 NOTE — Telephone Encounter (Signed)
Carelink alert received- ILR alert (1) pause 03/09/21 @ 05:30 x 4 sec.   Pt immplanted for CVA, with now known PAF.  Current meds include Carvedilol 6.28m BID, Diltaizem 1273mDaily, Eliquis.    Spoke with pt. She confirmed that she was sleeping at time of episode.  It does not appear to be post termination pause.  Will continue to monitor.    Forwarding to MD for review due to 1st occurrence of nocturnal pause.

## 2021-03-15 DIAGNOSIS — B351 Tinea unguium: Secondary | ICD-10-CM | POA: Diagnosis not present

## 2021-03-15 DIAGNOSIS — L84 Corns and callosities: Secondary | ICD-10-CM | POA: Diagnosis not present

## 2021-03-15 DIAGNOSIS — E1142 Type 2 diabetes mellitus with diabetic polyneuropathy: Secondary | ICD-10-CM | POA: Diagnosis not present

## 2021-03-15 IMAGING — DX DG CHEST 2V
2 series · 2 of 2 positions shown · non-contrast
Comparison: 08/24/2017

CLINICAL DATA: Chest pain and short of breath

EXAM:
CHEST - 2 VIEW

[chest pa]
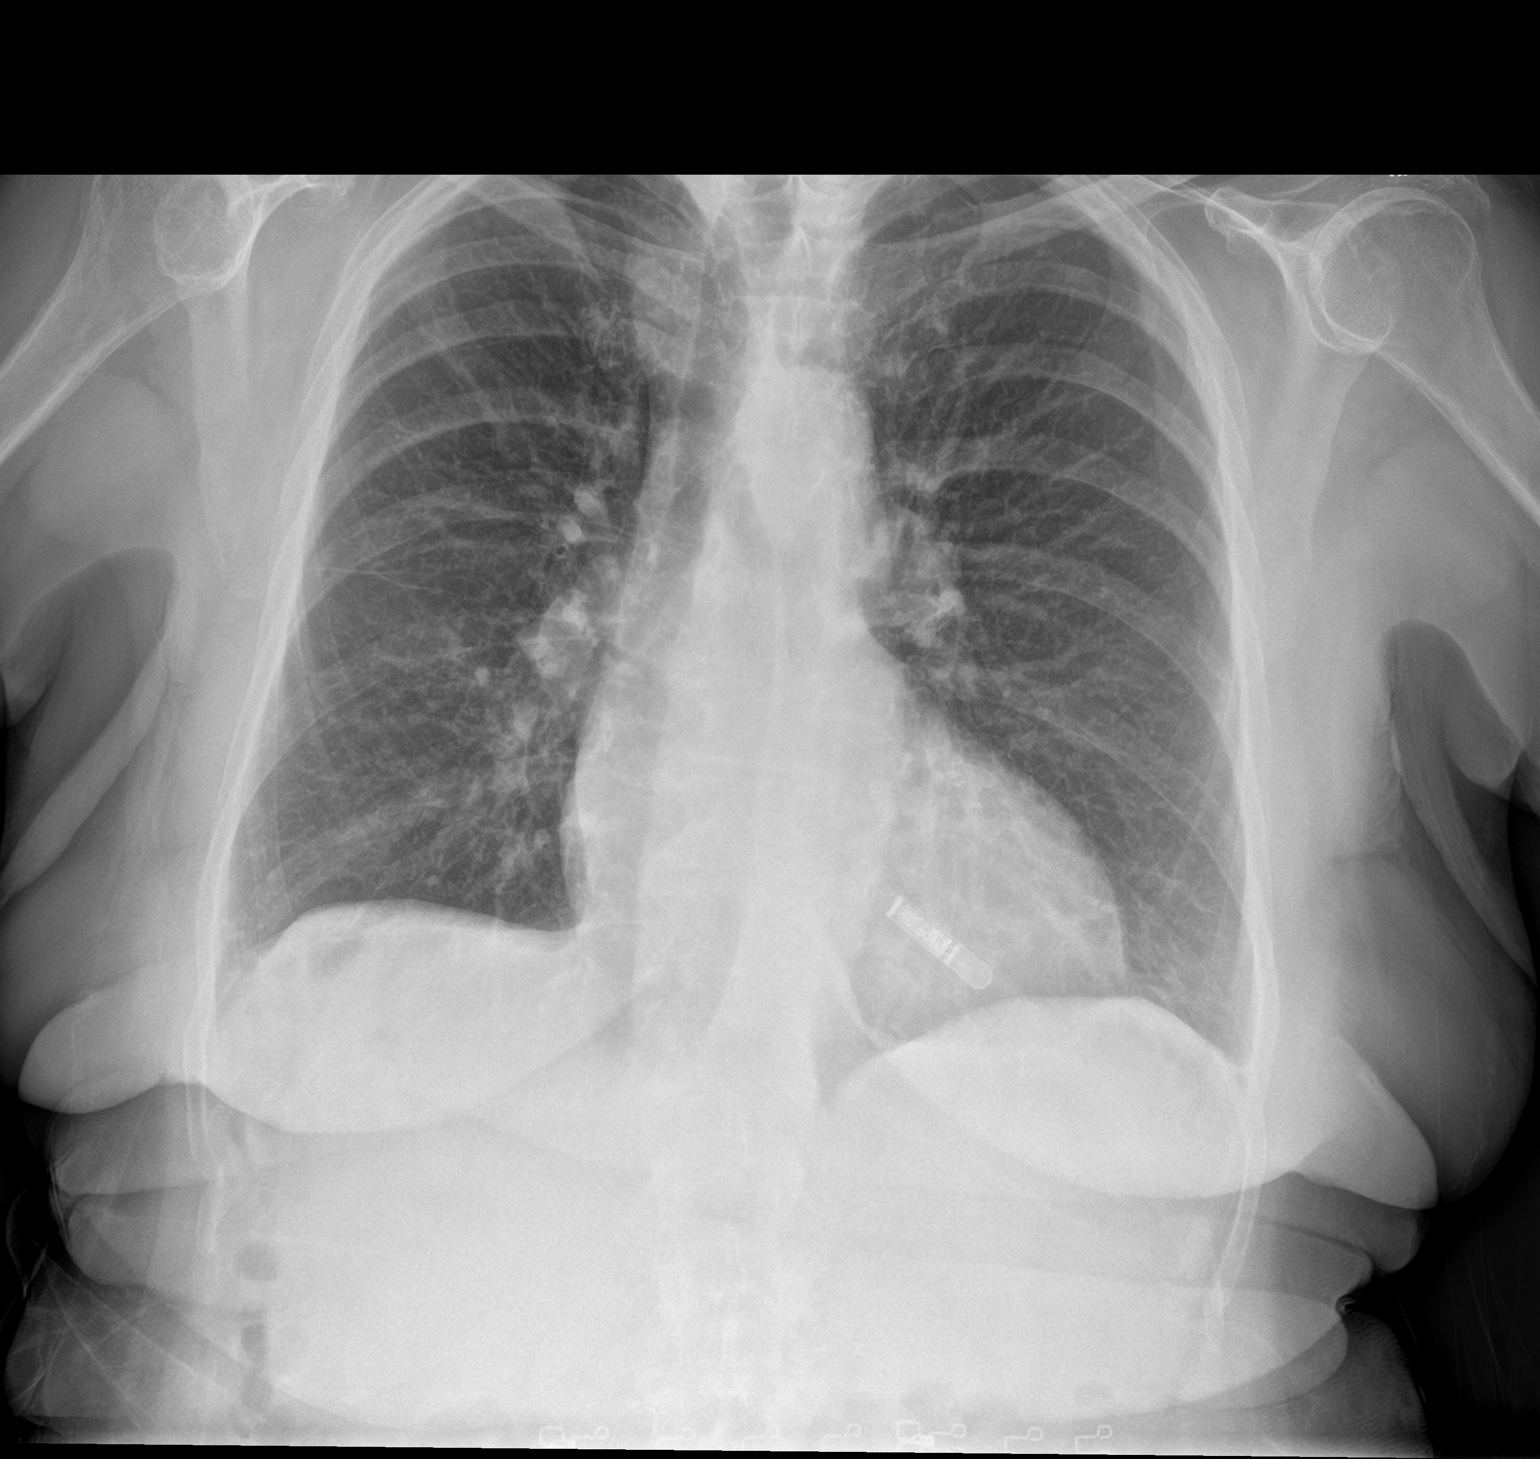

[chest lat]
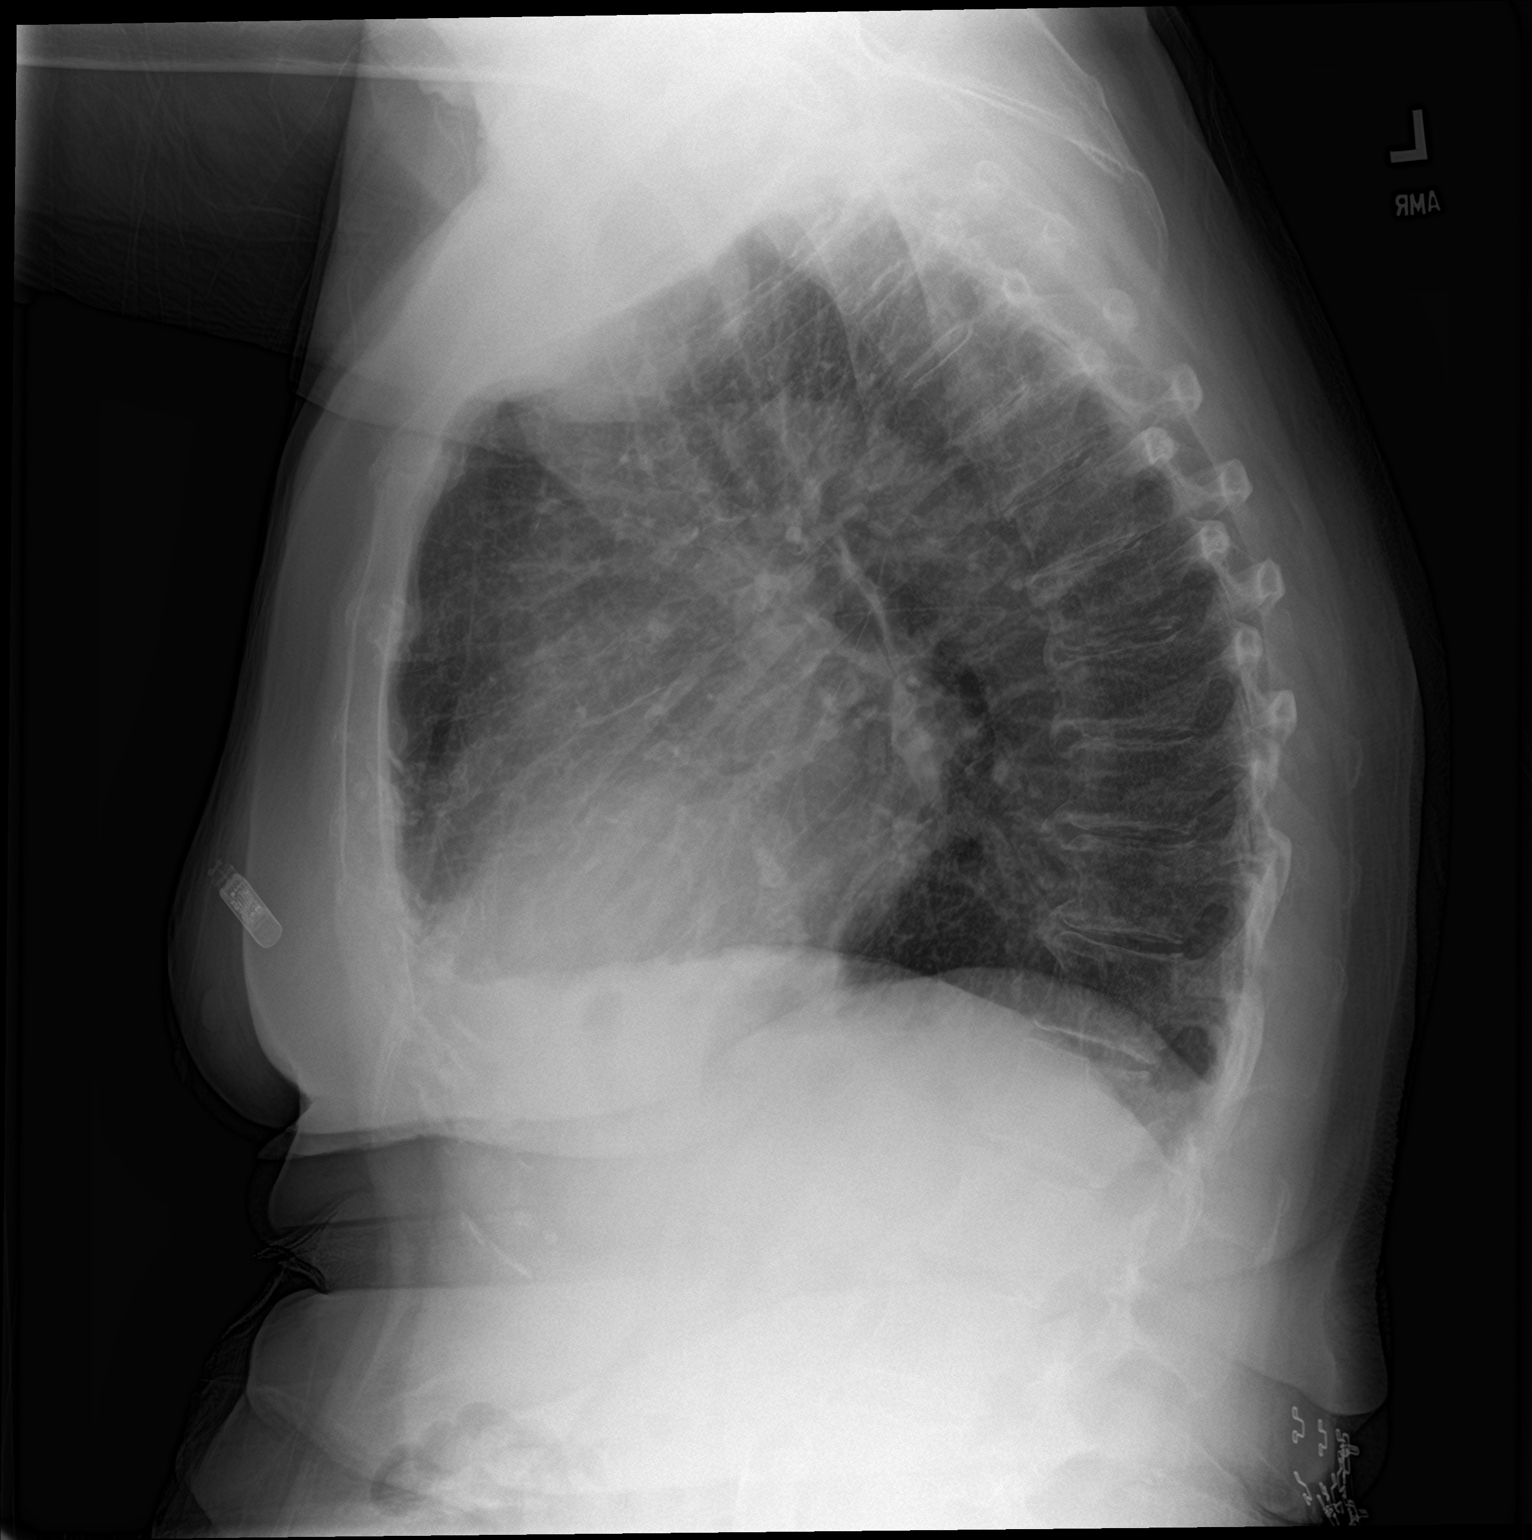

[2 of 2 positions shown; findings below may reference images not displayed]

FINDINGS: Heart size upper normal. Negative for heart failure. Vascularity
normal. Interval placement of cardiac loop recorder the left
anterior chest.

Lungs are well aerated and clear.  No infiltrate or effusion.
IMPRESSION: No active cardiopulmonary disease.

## 2021-03-20 NOTE — Progress Notes (Signed)
Carelink Summary Report / Loop Recorder 

## 2021-03-28 ENCOUNTER — Ambulatory Visit
Admission: RE | Admit: 2021-03-28 | Discharge: 2021-03-28 | Disposition: A | Discharge status: Home / Self Care | Payer: Medicare Other | Source: Ambulatory Visit | Attending: Internal Medicine | Admitting: Internal Medicine

## 2021-03-28 ENCOUNTER — Other Ambulatory Visit: Payer: Self-pay

## 2021-03-28 ENCOUNTER — Ambulatory Visit: Payer: Medicare Other

## 2021-03-28 DIAGNOSIS — Z1231 Encounter for screening mammogram for malignant neoplasm of breast: Secondary | ICD-10-CM

## 2021-04-01 ENCOUNTER — Ambulatory Visit (INDEPENDENT_AMBULATORY_CARE_PROVIDER_SITE_OTHER): Payer: Medicare Other

## 2021-04-01 DIAGNOSIS — I639 Cerebral infarction, unspecified: Secondary | ICD-10-CM | POA: Diagnosis not present

## 2021-04-05 LAB — CUP PACEART REMOTE DEVICE CHECK
Date Time Interrogation Session: 20220728233219
Implantable Pulse Generator Implant Date: 20210602

## 2021-04-07 DIAGNOSIS — I10 Essential (primary) hypertension: Secondary | ICD-10-CM | POA: Diagnosis not present

## 2021-04-07 DIAGNOSIS — E782 Mixed hyperlipidemia: Secondary | ICD-10-CM | POA: Diagnosis not present

## 2021-04-11 IMAGING — RF DG ESOPHAGUS
7 series · 13 of 24 positions shown · non-contrast
Comparison: None

CLINICAL DATA: Esophageal dysphagia, gets choked on mostly liquids
and pills; history stroke, cirrhosis with esophageal varices,
coronary artery disease, hypertension, diabetes mellitus. Post
endoscopy on 07/27/2020

EXAM:
ESOPHOGRAM / BARIUM SWALLOW / BARIUM TABLET STUDY
TECHNIQUE: Combined double contrast and single contrast examination performed
using effervescent crystals, thick barium liquid, and thin barium
liquid. The patient was observed with fluoroscopy swallowing a 13 mm
barium sulphate tablet.
FLUOROSCOPY TIME:  Fluoroscopy Time:  2 minutes 42 seconds
Radiation Exposure Index (if provided by the fluoroscopic device):
64.8 mGy
Number of Acquired Spot Images: multiple fluoroscopic screen
captures

[Series 1: cp_standard · 0.17mm/px · 2 of 83 frames shown (1 of 7)]
[frame 2/83]
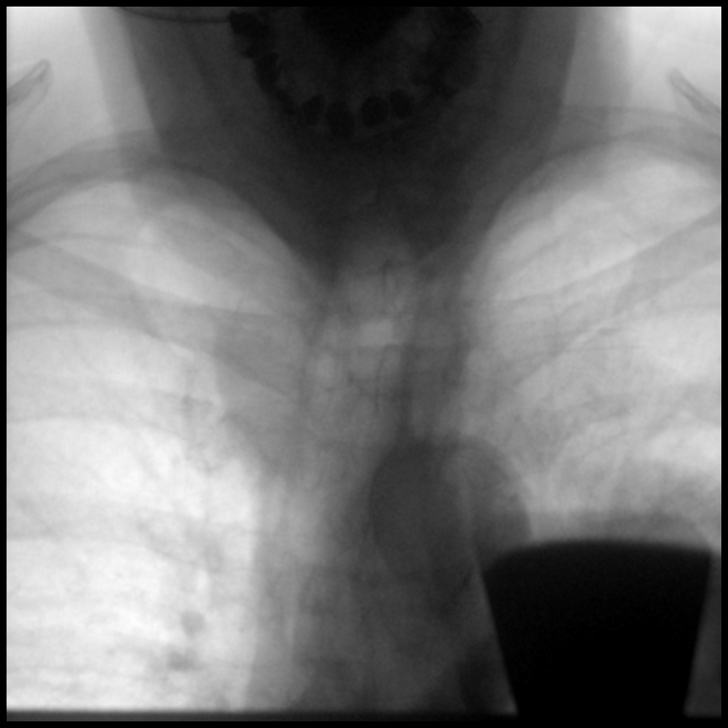
[frame 42/83]
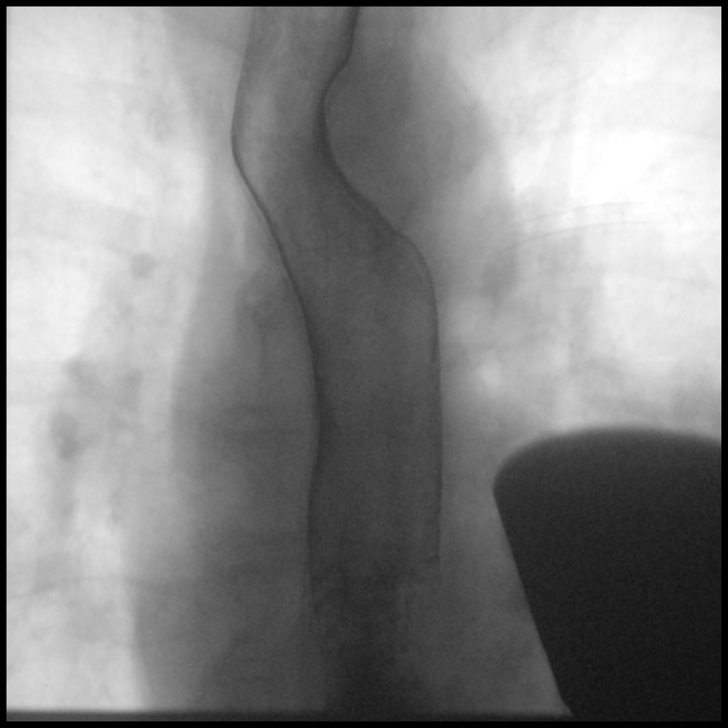

[Series 2: cp_standard · 0.17mm/px · 2 of 142 frames shown (2 of 7)]
[frame 22/142]
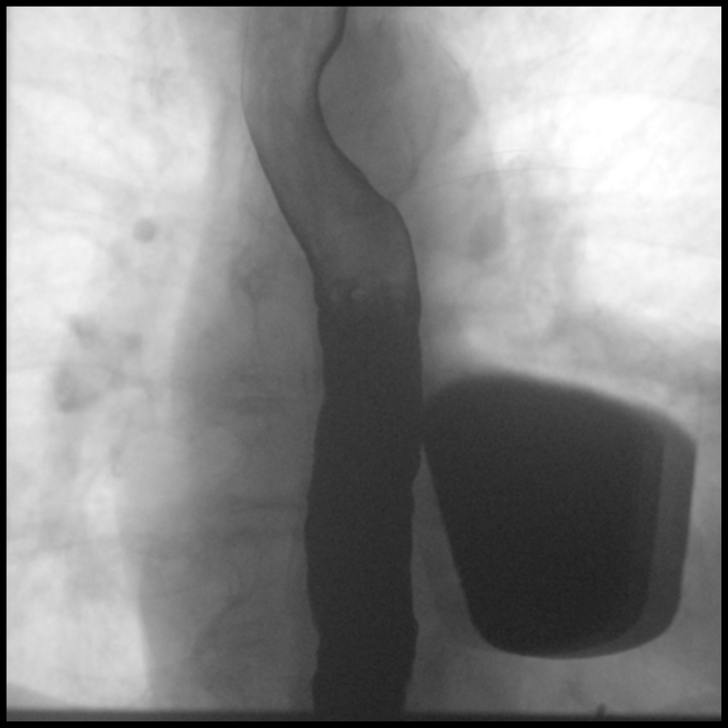
[frame 112/142]
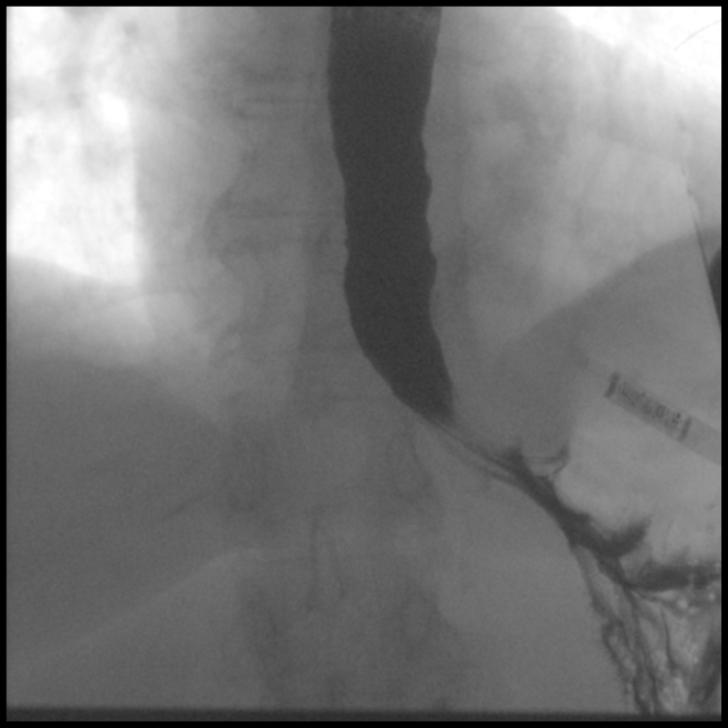

[Series 3: cp_standard · 0.18mm/px · 1 of 1 slices shown (3 of 7)]
[im 1/1]
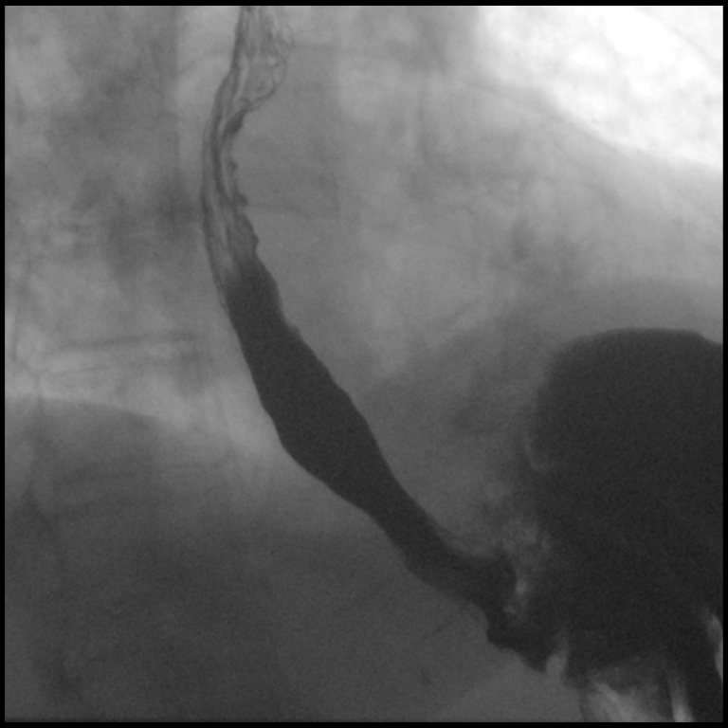

[Series 4: cp_standard · 0.18mm/px · 1 of 135 frames shown (4 of 7)]
[frame 21/135]
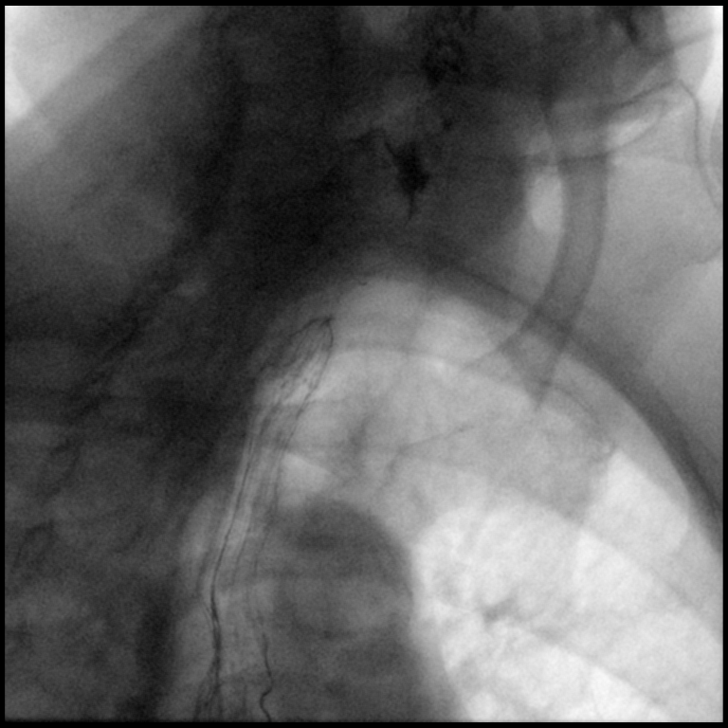

[Series 5: cp_standard · 0.26mm/px · 3 of 211 frames shown (5 of 7)]
[frame 32/211]
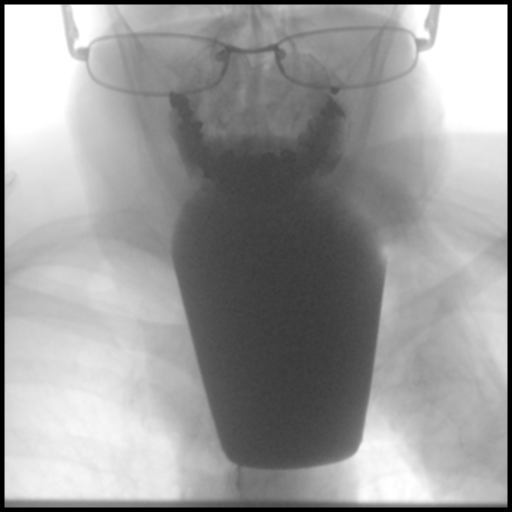
[frame 106/211]
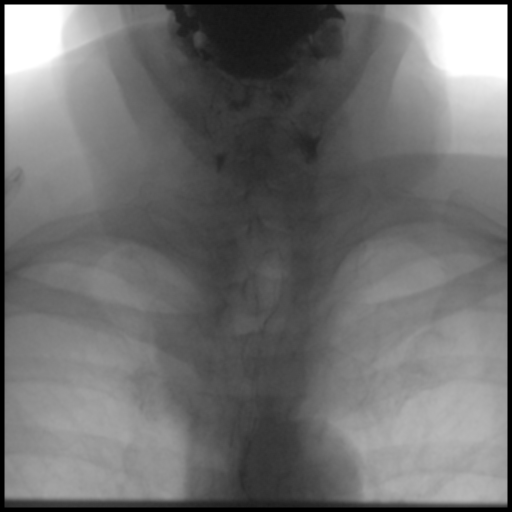
[frame 180/211]
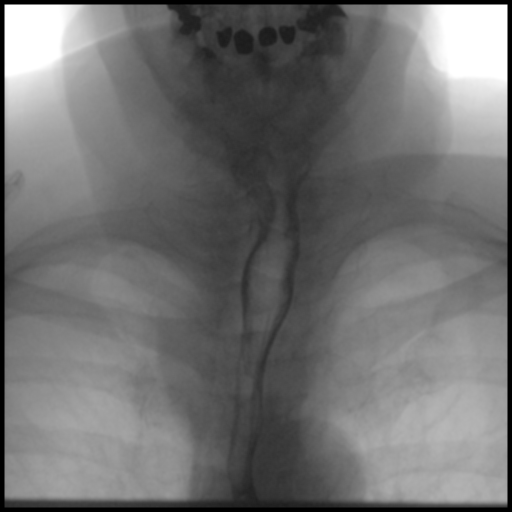

[Series 6: cp_standard · 0.25mm/px · 2 of 141 frames shown (6 of 7)]
[frame 48/141]
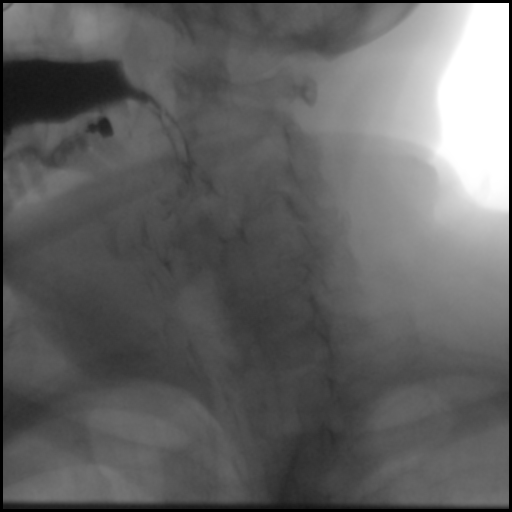
[frame 120/141]
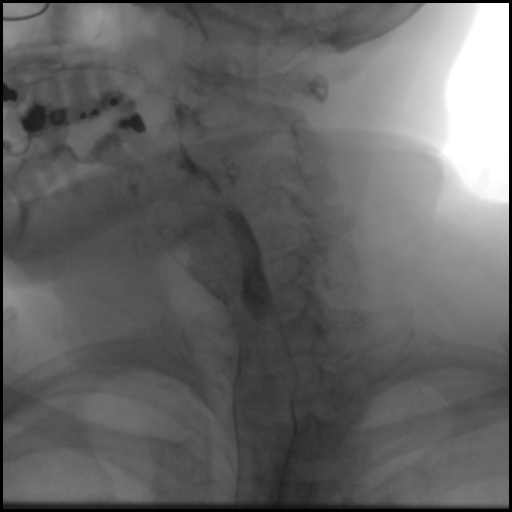

[Series 7: cp_standard · 0.26mm/px · 2 of 141 frames shown (7 of 7)]
[frame 71/141]
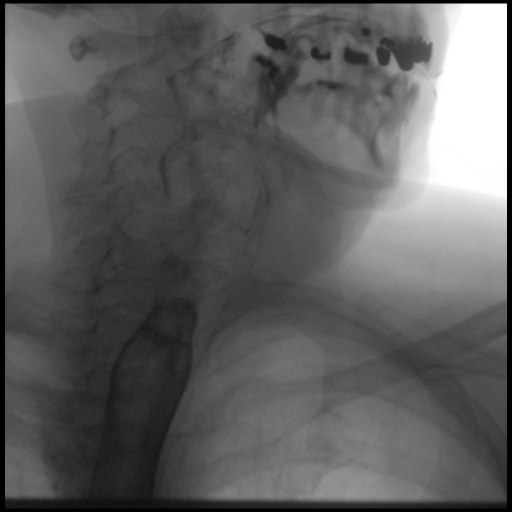
[frame 120/141]
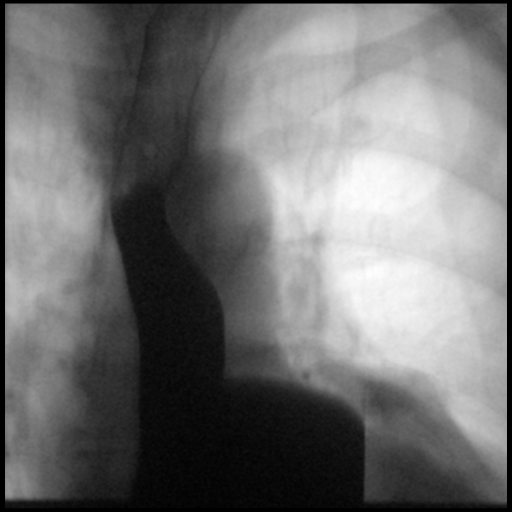

[13 of 24 positions shown; findings below may reference images not displayed]

FINDINGS: Examination done with patient on table top with head elevated to 60
degrees.

Esophageal distention: Relative narrowing of gastroesophageal
junction. Remainder of esophagus normal in caliber.

Filling defects: No mass or persistent intraluminal filling defect.
Thickened folds at the distal esophagus on the prior exam felt to be
due to esophageal varices are not identified on the current exam.

12.5 mm barium tablet: Transiently delayed at gastroesophageal
junction but passed after several swallows of thin barium.

Motility:  Diffuse age-related esophageal dysmotility

Mucosa: Mild wall irregularity of the distal esophageal wall. This
is at the level where esophageal varices were seen on the previous
exam. Uncertain if this represents esophagitis or sequela of prior
variceal intervention. No discrete mass.

Hypopharynx/cervical esophagus: No laryngeal penetration or
aspiration. Mildly prominent cricopharyngeus muscle.

Hiatal hernia:  Absent

GE reflux:  Not witnessed during exam

Other:  N/A
IMPRESSION: Relative narrowing of the gastroesophageal junction, transiently
delaying the 12.5 mm diameter barium tablet but the tablet was able
to pass into the stomach after a minute following several swallows
of thin barium.

Age-related esophageal dysmotility.

Previously identified esophageal varices not identified on current
study.

Wall irregularity of the distal esophagus at the level of esophageal
varices seen on previous exam, question due to esophagitis versus
sequela of variceal intervention; recommend correlation with recent
endoscopy.

## 2021-04-25 NOTE — Progress Notes (Signed)
Carelink Summary Report / Loop Recorder 

## 2021-05-02 ENCOUNTER — Ambulatory Visit (INDEPENDENT_AMBULATORY_CARE_PROVIDER_SITE_OTHER): Payer: Medicare Other

## 2021-05-02 DIAGNOSIS — I639 Cerebral infarction, unspecified: Secondary | ICD-10-CM | POA: Diagnosis not present

## 2021-05-08 LAB — CUP PACEART REMOTE DEVICE CHECK
Date Time Interrogation Session: 20220830233824
Implantable Pulse Generator Implant Date: 20210602

## 2021-05-16 NOTE — Progress Notes (Signed)
Carelink Summary Report / Loop Recorder 

## 2021-06-07 ENCOUNTER — Other Ambulatory Visit: Payer: Self-pay | Admitting: *Deleted

## 2021-06-07 NOTE — Patient Outreach (Signed)
[facility] Care Management  06/07/2021  [name] 08/01/1941 [id number]  Unsuccessful outreach attempt made to patient. RN Health Coach left HIPAA compliant message with the patient's granddaughter [name].  Plan: RN Health Coach will call patient within the month of October.  Emelia Loron RN, BSN [place] [phone] [name]

## 2021-06-10 ENCOUNTER — Ambulatory Visit (INDEPENDENT_AMBULATORY_CARE_PROVIDER_SITE_OTHER): Payer: Medicare Other

## 2021-06-10 DIAGNOSIS — I639 Cerebral infarction, unspecified: Secondary | ICD-10-CM | POA: Diagnosis not present

## 2021-06-10 LAB — CUP PACEART REMOTE DEVICE CHECK
Date Time Interrogation Session: 20221002234343
Implantable Pulse Generator Implant Date: 20210602

## 2021-06-17 NOTE — Progress Notes (Signed)
Carelink Summary Report / Loop Recorder 

## 2021-06-18 DIAGNOSIS — E1142 Type 2 diabetes mellitus with diabetic polyneuropathy: Secondary | ICD-10-CM | POA: Diagnosis not present

## 2021-06-18 DIAGNOSIS — L84 Corns and callosities: Secondary | ICD-10-CM | POA: Diagnosis not present

## 2021-06-18 DIAGNOSIS — B351 Tinea unguium: Secondary | ICD-10-CM | POA: Diagnosis not present

## 2021-06-21 ENCOUNTER — Ambulatory Visit: Payer: Self-pay | Admitting: *Deleted

## 2021-06-25 ENCOUNTER — Other Ambulatory Visit: Payer: Self-pay | Admitting: *Deleted

## 2021-06-25 NOTE — Patient Instructions (Signed)
Goals Addressed             This Visit's Progress    [facility]) Patient will verbalize continuation of doing PT exercises, chair exercises, and walking in her hallway routinely for the next 90 days   On track    Timeframe:  Long-Range Goal Priority:  High Start Date:  08/29/20                           Expected End Date: 03/07/22 Follow Up Date: 10/07/21  -Encourage continuation of doing PT exercises routinely  -Discussed doing chair exercises -Discussed increasing the amount of walking she does by walking the length of the house twice a day. -Encouraged patient to increase the amount of time she does her PT, chair exercises, and walking slowly and as tolerated to help maintain and increase her strength and mobility  Notes: 08/29/20: Patient stated goal is to increase her mobility and strength. She plans to do this by continuing to do physical therapy exercises several times weekly at home, and start to do some chair exercises.                      Updated 11/29/20: Patient reports she did not receive the chair exercise printouts. Nurse apologized and will send chair exercise printouts for the patient to follow.  Updated 03/05/21: Patient reports that she is doing chair exercises, PT exercises, and walking the length of the house daily. Nurse encouraged patient to increase the amount of time she does her PT/chair exercises slowly and as tolerated to help increase her strength and mobility. Discussed increasing the amount of walking she does by walking the length of the house twice a day.  Updated 06/25/21: Patient states that she does want to increase her strength and mobility. Patient explains that she does do some chair exercises, PT exercises, and walks to the kitchen and back but admits she could do these activities for longer periods and more often to help maintain and increase her strength and mobility.      [facility]) Patient will verbalize monitoring her B/P and recording the values daily  within the next 90 days   Not on track    Timeframe:  Long-Range Goal Priority:  High Start Date: 08/29/20                            Expected End Date: 03/07/22                  Follow Up Date 10/07/21   - check blood pressure daily - choose a place to take my blood pressure (home, clinic or office, retail store) - write blood pressure results in a log or diary  -Encouraged patient to begin to record her B/P values to be able to monitor her trends -Encouraged patient to continue to eat a heart healthy, low sodium diet -Discussed high B/P may not have any symptoms and monitoring your B/P to ensure it is not dangerously high can prevent stroke, kidney damage, heart damage, and eye damage  Why is this important?   You won't feel high blood pressure, but it can still hurt your blood vessels.  High blood pressure can cause heart or kidney problems. It can also cause a stroke.  Making lifestyle changes like losing a little weight or eating less salt will help.  Checking your blood pressure at home and at  different times of the day can help to control blood pressure.  If the doctor prescribes medicine remember to take it the way the doctor ordered.  Call the office if you cannot afford the medicine or if there are questions about it.     Notes: 08/29/20: Patient explained she does check her B/P daily but is not writing down the values. Patient states she will start to record her B/P values. Nurse did discuss the importance of writing down the values in a log so the patient and her provider can easily see her values and determine if her B/P is under control. Nurse will send the patient hypertension education and a calendar booklet for her to record her values.   Updated 11/29/20: Patient states that she did not receive the calendar booklet or hypertension education. Nurse apologize and will send the materials. Patient reports taking her B/P daily but not recording the values. Nurse did discuss the  importance of recording her values to be able to monitor her trends and have the values available for her doctor to evaluate her B/P control.  Updated 03/05/21: Patient states that she did receive the calendar booklet and hypertension education. She reports that she has not taken her B/P the last couple of days, she does plan to start to do better, and will start to take her B/P daily and record the values. Patient explains that her B/P values have been good. Typically in the 147'W systolic over 92'H diastolic. She states that she does try to limit the salt in her diet.   Updated 06/25/21: Patient reports that she has not been taking her B/P because her B/P monitor is broken. Nurse provided education, will send patient a B/P monitor, and hypertension education. Patient states she will begin to monitor her B/P daily and will record her values into the calendar booklet she received previously once she receives the B/P monitor in the mail.

## 2021-06-25 NOTE — Patient Outreach (Signed)
[facility]) Care Management  [place]  06/25/2021   Rina Adney Griffing 05-Dec-1940 [id number]  Subjective: Successful telephone outreach call to patient. HIPAA identifiers obtained. Patient reports feeling "pretty good". She explains that she has not been monitoring her B/P because her monitor is broken. Nurse provided education and will send patient a B/P monitor and hypertension education.  Nurse discussed with patient her health goals and her health and wellness needs which were documented in the Epic system. Patient did not have any further questions or concerns today and did confirm that she has this nurse's contact number to call her if needed.   Encounter Medications:  Outpatient Encounter Medications as of 06/25/2021  Medication Sig Note   acetaminophen (TYLENOL) 500 MG tablet Take 1,000 mg by mouth every 6 (six) hours as needed for moderate pain.    atorvastatin (LIPITOR) 80 MG tablet Take 1 tablet (80 mg total) by mouth daily.    azelastine (ASTELIN) 0.1 % nasal spray Place 1 spray into both nostrils 2 (two) times daily.    calcium citrate (CALCITRATE - DOSED IN MG ELEMENTAL CALCIUM) 950 MG tablet Take 1 tablet (200 mg of elemental calcium total) by mouth daily. (Patient taking differently: Take 0.5 tablets by mouth daily.)    carvedilol (COREG) 6.25 MG tablet Take 1 tablet (6.25 mg total) by mouth 2 (two) times daily with a meal.    Coenzyme Q10 100 MG capsule Take 100 mg by mouth daily.    diltiazem (CARDIZEM CD) 120 MG 24 hr capsule TAKE (1) CAPSULE BY MOUTH ONCE DAILY. (Patient taking differently: Take 120 mg by mouth daily.)    ELIQUIS 5 MG TABS tablet TAKE (1) TABLET BY MOUTH TWICE DAILY. (Patient taking differently: Take 5 mg by mouth 2 (two) times daily.)    furosemide (LASIX) 20 MG tablet Take 20 mg by mouth daily. 02/05/2021: As needed   mirtazapine (REMERON) 45 MG tablet Take 45 mg by mouth at bedtime.    Multiple Vitamins-Minerals (MULTIVITAMIN  GUMMIES ADULT PO) Take 2 tablets by mouth daily.    nitroGLYCERIN (NITROSTAT) 0.4 MG SL tablet Place under the tongue.    ondansetron (ZOFRAN) 4 MG tablet Take 4 mg by mouth every 8 (eight) hours as needed for nausea or vomiting.    polyethylene glycol powder (GLYCOLAX/MIRALAX) 17 GM/SCOOP powder Take 8.5 g by mouth daily. 02/05/2021: As needed   Propylene Glycol (SYSTANE COMPLETE OP) Place 1 drop into both eyes in the morning and at bedtime.    HYDROcodone-acetaminophen (NORCO) 10-325 MG tablet Take 1 tablet by mouth as needed for pain. (Patient not taking: No sig reported) 06/25/2021: completed   methocarbamol (ROBAXIN) 500 MG tablet Take 500 mg by mouth 3 (three) times daily as needed for muscle spasms. (Patient not taking: No sig reported) 06/25/2021: completed   omeprazole (PRILOSEC) 20 MG capsule Take 2 capsules (40 mg total) by mouth daily for 15 days.    No facility-administered encounter medications on file as of 06/25/2021.    Functional Status:  In your present state of health, do you have any difficulty performing the following activities: 07/26/2020  Hearing? N  Vision? N  Difficulty concentrating or making decisions? N  Walking or climbing stairs? N  Dressing or bathing? N  Doing errands, shopping? N  Some recent data might be hidden    Fall/Depression Screening: Fall Risk  06/25/2021 03/05/2021 02/05/2021  Falls in the past year? 1 1 0  Comment - - -  Number falls  in past yr: 1 1 -  Injury with Fall? 1 1 -  Risk Factor Category  - - -  Risk for fall due to : Impaired mobility;Impaired balance/gait;History of fall(s) Impaired mobility;Impaired balance/gait;History of fall(s) -  Risk for fall due to: Comment - - -  Follow up Falls prevention discussed;Education provided;Falls evaluation completed Falls evaluation completed;Education provided;Falls prevention discussed -   PHQ 2/9 Scores 08/29/2020 03/16/2019 03/16/2019 07/29/2018 05/13/2018 12/28/2017 09/15/2017  PHQ - 2 Score 1 0  0 0 1 0 1  PHQ- 9 Score 4 - - 1 - - -    Assessment:   Care Plan There are no care plans that you recently modified to display for this patient.    Goals Addressed             This Visit's Progress    [facility]) Patient will verbalize continuation of doing PT exercises, chair exercises, and walking in her hallway routinely for the next 90 days   On track    Timeframe:  Long-Range Goal Priority:  High Start Date:  08/29/20                           Expected End Date: 03/07/22 Follow Up Date: 10/07/21  -Encourage continuation of doing PT exercises routinely  -Discussed doing chair exercises -Discussed increasing the amount of walking she does by walking the length of the house twice a day. -Encouraged patient to increase the amount of time she does her PT, chair exercises, and walking slowly and as tolerated to help maintain and increase her strength and mobility  Notes: 08/29/20: Patient stated goal is to increase her mobility and strength. She plans to do this by continuing to do physical therapy exercises several times weekly at home, and start to do some chair exercises.                      Updated 11/29/20: Patient reports she did not receive the chair exercise printouts. Nurse apologized and will send chair exercise printouts for the patient to follow.  Updated 03/05/21: Patient reports that she is doing chair exercises, PT exercises, and walking the length of the house daily. Nurse encouraged patient to increase the amount of time she does her PT/chair exercises slowly and as tolerated to help increase her strength and mobility. Discussed increasing the amount of walking she does by walking the length of the house twice a day.  Updated 06/25/21: Patient states that she does want to increase her strength and mobility. Patient explains that she does do some chair exercises, PT exercises, and walks to the kitchen and back but admits she could do these activities for longer periods and  more often to help maintain and increase her strength and mobility.      [facility]) Patient will verbalize monitoring her B/P and recording the values daily within the next 90 days   Not on track    Timeframe:  Long-Range Goal Priority:  High Start Date: 08/29/20                            Expected End Date: 03/07/22                  Follow Up Date 10/07/21   - check blood pressure daily - choose a place to take my blood pressure (home, clinic or office, retail store) - write blood  pressure results in a log or diary  -Encouraged patient to begin to record her B/P values to be able to monitor her trends -Encouraged patient to continue to eat a heart healthy, low sodium diet -Discussed high B/P may not have any symptoms and monitoring your B/P to ensure it is not dangerously high can prevent stroke, kidney damage, heart damage, and eye damage  Why is this important?   You won't feel high blood pressure, but it can still hurt your blood vessels.  High blood pressure can cause heart or kidney problems. It can also cause a stroke.  Making lifestyle changes like losing a little weight or eating less salt will help.  Checking your blood pressure at home and at different times of the day can help to control blood pressure.  If the doctor prescribes medicine remember to take it the way the doctor ordered.  Call the office if you cannot afford the medicine or if there are questions about it.     Notes: 08/29/20: Patient explained she does check her B/P daily but is not writing down the values. Patient states she will start to record her B/P values. Nurse did discuss the importance of writing down the values in a log so the patient and her provider can easily see her values and determine if her B/P is under control. Nurse will send the patient hypertension education and a calendar booklet for her to record her values.   Updated 11/29/20: Patient states that she did not receive the calendar booklet or  hypertension education. Nurse apologize and will send the materials. Patient reports taking her B/P daily but not recording the values. Nurse did discuss the importance of recording her values to be able to monitor her trends and have the values available for her doctor to evaluate her B/P control.  Updated 03/05/21: Patient states that she did receive the calendar booklet and hypertension education. She reports that she has not taken her B/P the last couple of days, she does plan to start to do better, and will start to take her B/P daily and record the values. Patient explains that her B/P values have been good. Typically in the 492'E systolic over 10'O diastolic. She states that she does try to limit the salt in her diet.   Updated 06/25/21: Patient reports that she has not been taking her B/P because her B/P monitor is broken. Nurse provided education, will send patient a B/P monitor, and hypertension education. Patient states she will begin to monitor her B/P daily and will record her values into the calendar booklet she received previously once she receives the B/P monitor in the mail.         Plan: RN Health Coach will send PCP today's assessment note, will send patient a B/P monitor and hypertension education, and will call patient within the month of  January.  Follow-up: Patient agrees to Care Plan and Follow-up.  Emelia Loron RN, BSN [place] [phone] [email or web address]

## 2021-07-01 ENCOUNTER — Other Ambulatory Visit: Payer: Self-pay | Admitting: Cardiology

## 2021-07-01 ENCOUNTER — Other Ambulatory Visit (HOSPITAL_COMMUNITY): Payer: Self-pay | Admitting: Physician Assistant

## 2021-07-01 NOTE — Telephone Encounter (Signed)
Eliquis 5 mg refill request received. Patient is 80 years old, weight-70.8 kg, Crea- 0.68 on 07/27/20, Diagnosis-afib, and last seen by Dr. Harl Bowie on 01/10/21. Dose is appropriate based on dosing criteria. Will send in refill to requested pharmacy.

## 2021-07-03 ENCOUNTER — Encounter (INDEPENDENT_AMBULATORY_CARE_PROVIDER_SITE_OTHER): Payer: Self-pay | Admitting: *Deleted

## 2021-07-14 LAB — CUP PACEART REMOTE DEVICE CHECK
Date Time Interrogation Session: 20221104234724
Implantable Pulse Generator Implant Date: 20210602

## 2021-07-15 ENCOUNTER — Ambulatory Visit (INDEPENDENT_AMBULATORY_CARE_PROVIDER_SITE_OTHER): Payer: Medicare Other

## 2021-07-15 DIAGNOSIS — I639 Cerebral infarction, unspecified: Secondary | ICD-10-CM | POA: Diagnosis not present

## 2021-07-19 ENCOUNTER — Telehealth (INDEPENDENT_AMBULATORY_CARE_PROVIDER_SITE_OTHER): Payer: Self-pay | Admitting: *Deleted

## 2021-07-19 NOTE — Progress Notes (Signed)
Carelink Summary Report / Loop Recorder 

## 2021-07-19 NOTE — Telephone Encounter (Signed)
Pt's granddaughter [name] called to clarify directions of omeprazole. She states last refill had directions of one bid and she was not sure of mg since she was not there to look at bottle. Last note in chart about med states she should be on omeprazole 71m daily indefinitely ( note from Dr. CJenetta Downeron 08/22/20) I called [facility] to see what was dispensed and she got 231mtake 2 daily on 06/17/21. Pharmacy also states she last filled in January before this time and might of been getting somewhere else. Called and left message to return call with brittany to discuss.   Granddaughter [name] [phone]

## 2021-07-22 NOTE — Telephone Encounter (Signed)
Granddaughter states her bottle at home was 67m take 2 daily.

## 2021-07-24 ENCOUNTER — Other Ambulatory Visit (HOSPITAL_COMMUNITY): Payer: Self-pay | Admitting: Nurse Practitioner

## 2021-08-13 ENCOUNTER — Other Ambulatory Visit: Payer: Self-pay | Admitting: Cardiology

## 2021-08-13 NOTE — Telephone Encounter (Signed)
Prescription refill request for Eliquis received. Indication: PAF Last office visit: 01/10/21  Zandra Abts MD Scr: 0.79 on 01/29/21 Age: 80 Weight: 70kg  Based on above findings Eliquis 53m twice daily is the appropriate dose.  Pt is due for f/u appt with Dr BHarl Bowieand labs for continued Eliquis refills.  Message sent to [employer]  Refill approved x 2

## 2021-08-15 DIAGNOSIS — R002 Palpitations: Secondary | ICD-10-CM | POA: Diagnosis not present

## 2021-08-15 DIAGNOSIS — G4709 Other insomnia: Secondary | ICD-10-CM | POA: Diagnosis not present

## 2021-08-15 DIAGNOSIS — I4891 Unspecified atrial fibrillation: Secondary | ICD-10-CM | POA: Diagnosis not present

## 2021-08-15 DIAGNOSIS — I10 Essential (primary) hypertension: Secondary | ICD-10-CM | POA: Diagnosis not present

## 2021-08-15 DIAGNOSIS — H722X2 Other marginal perforations of tympanic membrane, left ear: Secondary | ICD-10-CM | POA: Diagnosis not present

## 2021-08-15 DIAGNOSIS — E1140 Type 2 diabetes mellitus with diabetic neuropathy, unspecified: Secondary | ICD-10-CM | POA: Diagnosis not present

## 2021-08-15 DIAGNOSIS — I252 Old myocardial infarction: Secondary | ICD-10-CM | POA: Diagnosis not present

## 2021-08-15 LAB — CUP PACEART REMOTE DEVICE CHECK
Date Time Interrogation Session: 20221207224817
Implantable Pulse Generator Implant Date: 20210602

## 2021-08-19 ENCOUNTER — Ambulatory Visit (INDEPENDENT_AMBULATORY_CARE_PROVIDER_SITE_OTHER): Payer: Medicare Other

## 2021-08-19 ENCOUNTER — Ambulatory Visit: Payer: Medicare Other | Admitting: Cardiology

## 2021-08-19 DIAGNOSIS — I639 Cerebral infarction, unspecified: Secondary | ICD-10-CM | POA: Diagnosis not present

## 2021-08-27 DIAGNOSIS — E1142 Type 2 diabetes mellitus with diabetic polyneuropathy: Secondary | ICD-10-CM | POA: Diagnosis not present

## 2021-08-27 DIAGNOSIS — L84 Corns and callosities: Secondary | ICD-10-CM | POA: Diagnosis not present

## 2021-08-27 DIAGNOSIS — B351 Tinea unguium: Secondary | ICD-10-CM | POA: Diagnosis not present

## 2021-08-28 NOTE — Progress Notes (Signed)
Carelink Summary Report / Loop Recorder 

## 2021-09-04 ENCOUNTER — Other Ambulatory Visit: Payer: Self-pay | Admitting: Cardiology

## 2021-09-12 ENCOUNTER — Other Ambulatory Visit: Payer: Self-pay | Admitting: Cardiology

## 2021-09-20 ENCOUNTER — Other Ambulatory Visit (INDEPENDENT_AMBULATORY_CARE_PROVIDER_SITE_OTHER): Payer: Self-pay | Admitting: Gastroenterology

## 2021-09-20 DIAGNOSIS — K219 Gastro-esophageal reflux disease without esophagitis: Secondary | ICD-10-CM

## 2021-09-23 ENCOUNTER — Ambulatory Visit (INDEPENDENT_AMBULATORY_CARE_PROVIDER_SITE_OTHER): Payer: Medicare Other

## 2021-09-23 ENCOUNTER — Other Ambulatory Visit (INDEPENDENT_AMBULATORY_CARE_PROVIDER_SITE_OTHER): Payer: Self-pay

## 2021-09-23 ENCOUNTER — Encounter (INDEPENDENT_AMBULATORY_CARE_PROVIDER_SITE_OTHER): Payer: Self-pay

## 2021-09-23 DIAGNOSIS — R55 Syncope and collapse: Secondary | ICD-10-CM

## 2021-09-23 NOTE — Telephone Encounter (Signed)
Last seen by Thayer Headings 05/2020

## 2021-09-23 NOTE — Telephone Encounter (Signed)
Will refill for 3 months, needs follow up appointment for further refills

## 2021-09-23 NOTE — Telephone Encounter (Signed)
Sent a message to patient on my chart.

## 2021-09-24 LAB — CUP PACEART REMOTE DEVICE CHECK
Date Time Interrogation Session: 20230115232413
Implantable Pulse Generator Implant Date: 20210602

## 2021-09-25 DIAGNOSIS — J329 Chronic sinusitis, unspecified: Secondary | ICD-10-CM | POA: Diagnosis not present

## 2021-09-25 DIAGNOSIS — G4709 Other insomnia: Secondary | ICD-10-CM | POA: Diagnosis not present

## 2021-09-25 DIAGNOSIS — G8929 Other chronic pain: Secondary | ICD-10-CM | POA: Diagnosis not present

## 2021-09-26 ENCOUNTER — Other Ambulatory Visit: Payer: Self-pay | Admitting: *Deleted

## 2021-09-26 NOTE — Patient Outreach (Signed)
[name]  09/26/2021  [name] Apr 04, 1941 [id number]  [name] RN Health Coach Note   09/26/2021 Name:  [name] MRN:  [id number] DOB:  05-12-1941  Summary: Patient states she is feeling well. Patient reports that she did receive the B/P monitor and she is taking her B/P daily. Her ranges are in the systolic 211-941 over 74-08. She is adhering to a low sodium diet and walks in her hallway 3 times a day for about 5 minutes to increase her mobility and strength. Patient states her home environment is safe and that she is well supported by her family.Patient did not have any further questions or concerns today and did confirm that she has this nurse's contact number to call her if needed.   Recommendations/Changes made from today's visit: continue to adhere to a low sodium diet  Continue to walk in your hallway 3 times a day for 5 minutes to increase mobility and strength Start to walk in place in front of your chair with you walker in front to grab if needed to further gain leg strength  Subjective: [name] is an 81 y.o. year old female who is a primary patient of [employer], MD. The care management team was consulted for assistance with care management and/or care coordination needs.    RN Health Coach completed Telephone Visit today.   Objective:  Medications Reviewed Today     Reviewed by Michiel Cowboy, RN (Registered Nurse) on 09/26/21 at 1433  Med List Status: <None>   Medication Order Taking? Sig Documenting Provider Last Dose Status Informant  acetaminophen (TYLENOL) 500 MG tablet [id number] Yes Take 1,000 mg by mouth every 6 (six) hours as needed for moderate pain. [provider] Taking Active Self  apixaban (ELIQUIS) 5 MG TABS tablet [id number] Yes TAKE (1) TABLET BY MOUTH TWICE DAILY. Arnoldo Lenis, MD Taking Active   atorvastatin (LIPITOR) 80 MG tablet [id number]  Yes Take 1 tablet (80 mg total) by mouth daily. Cathlyn Parsons, PA-C Taking Active Self  azelastine (ASTELIN) 0.1 % nasal spray [id number] Yes Place 1 spray into both nostrils 2 (two) times daily. [provider] Taking Active   calcium citrate (CALCITRATE - DOSED IN MG ELEMENTAL CALCIUM) 950 MG tablet [id number] Yes Take 1 tablet (200 mg of elemental calcium total) by mouth daily.  Patient taking differently: Take 0.5 tablets by mouth daily.   Cathlyn Parsons, PA-C Taking Active   carvedilol (COREG) 6.25 MG tablet [id number]  Take 1 tablet (6.25 mg total) by mouth 2 (two) times daily with a meal. Montez Morita, Quillian Quince, MD  Active   Coenzyme Q10 100 MG capsule [id number] Yes Take 100 mg by mouth daily. [provider] Taking Active Self  diltiazem (CARDIZEM CD) 120 MG 24 hr capsule [id number] Yes TAKE (1) CAPSULE BY MOUTH ONCE DAILY. Arnoldo Lenis, MD Taking Active   furosemide (LASIX) 20 MG tablet [id number] Yes Take 20 mg by mouth daily. [provider] Taking Active Self           Med Note Minna Antis   Tue Feb 05, 2021 10:12 AM) As needed  HYDROcodone-acetaminophen (NORCO) 10-325 MG tablet [id number] No Take 1 tablet by mouth as needed for pain.  Patient not taking: Reported on 09/26/2021   [provider] Not Taking Active            Med Note (Sheniah Supak A  Tue Jun 25, 2021  3:18 PM) completed  methocarbamol (ROBAXIN) 500 MG tablet [id number] No Take 500 mg by mouth 3 (three) times daily as needed for muscle spasms.  Patient not taking: Reported on 03/05/2021   [provider] Not Taking Active            Med Note Laretta Alstrom, Grae Cannata A   Tue Jun 25, 2021  3:19 PM) completed  mirtazapine (REMERON) 45 MG tablet [id number] Yes Take 45 mg by mouth at bedtime. [provider] Taking Active   Multiple Vitamins-Minerals (MULTIVITAMIN GUMMIES ADULT PO) [id number] Yes Take 2 tablets by mouth daily. [provider] Taking Active Self   nitroGLYCERIN (NITROSTAT) 0.4 MG SL tablet [id number] Yes Place under the tongue. [provider] Taking Active   omeprazole (PRILOSEC) 20 MG capsule [id number] Yes TAKE (2) CAPSULES BY MOUTH DAILY BEFORE A MEAL FOR 15 DAYS. Montez Morita, Quillian Quince, MD Taking Active   ondansetron [facility]) 4 MG tablet [id number] Yes Take 4 mg by mouth every 8 (eight) hours as needed for nausea or vomiting. [provider] Taking Active Self  polyethylene glycol powder (GLYCOLAX/MIRALAX) 17 GM/SCOOP powder [id number] Yes Take 8.5 g by mouth daily. Rogene Houston, MD Taking Active            Med Note ([place], [place] Feb 05, 2021 10:12 AM) As needed  Propylene Glycol (SYSTANE COMPLETE OP) [id number] Yes Place 1 drop into both eyes in the morning and at bedtime. [provider] Taking Active Self             SDOH:  (Social Determinants of Health) assessments and interventions performed: SDOH assessments completed today and documented in the Epic system.     Care Plan  Review of patient past medical history, allergies, medications, health status, including review of consultants reports, laboratory and other test data, was performed as part of comprehensive evaluation for care management services.   Care Plan : Hypertension (Adult)  Updates made by Michiel Cowboy, RN since 09/26/2021 12:00 AM     Problem: Hypertension (Hypertension) Resolved 09/26/2021  Priority: High     Long-Range Goal: Hypertension Monitored Completed 09/26/2021  Start Date: 08/29/2020  Expected End Date: 09/06/2021  Note:   Resolving due to duplicate goal  Evidence-based guidance:  Promote initial use of ambulatory blood pressure measurements (for 3 days) to rule out "white-coat" effect; identify masked hypertension and presence or absence of nocturnal "dipping" of blood pressure.   Encourage continued use of home blood pressure monitoring and recording in blood pressure log; include symptoms of  hypotension or potential medication side effects in log.  Review blood pressure measurements taken inside and outside of the provider office; establish baseline and monitor trends; compare to target ranges or patient goal.  Share overall cardiovascular risk with patient; encourage changes to lifestyle risk factors, including alcohol consumption, smoking, inadequate exercise, poor dietary habits and stress.   Notes:     Task: Identify and Monitor Blood Pressure Elevation Completed 09/26/2021  Due Date: 09/06/2021  Note:   Care Management Activities:    - blood pressure trends reviewed - depression screen reviewed - home or ambulatory blood pressure monitoring encouraged    Notes:     Problem: Disease Progression (Hypertension) Resolved 09/26/2021  Priority: High     Long-Range Goal: Disease Progression Prevented or Minimized Completed 09/26/2021  Start Date: 08/29/2020  Expected End Date: 09/06/2021  Note:   Resolving due to duplicate goal  Evidence-based guidance:  Tailor lifestyle advice to individual; review progress regularly; give frequent encouragement and respond positively to incremental successes.  Assess for and promote awareness of worsening disease or development of comorbidity.  Prepare patient for laboratory and diagnostic exams based on risk and presentation.  Prepare patient for use of pharmacologic therapy that may include diuretic, beta-blocker, beta-blocker/thiazide combination, angiotensin-converting enzyme inhibitor, renin-angiotensin blocker or calcium-channel blocker.  Expect periodic adjustments to pharmacologic therapy; manage side effects.  Promote a healthy diet that includes primarily plant-based foods, such as fruits, vegetables, whole grains, beans and legumes, low-fat dairy and lean meats.   Consider moderate reduction in sodium intake by avoiding the addition of salt to prepared foods and limiting processed meats, canned soup, frozen meals and salty  snacks.   Promote a regular, daily exercise goal of 150 minutes per week of moderate exercise based on tolerance, ability and patient choice; consider referral to physical therapist, community wellness and/or activity program.  Encourage the avoidance of no more than 2 hours per day of sedentary activity, such as recreational screen time.  Review sources of stress; explore current coping strategies and encourage use of mindfulness, yoga, meditation or exercise to manage stress.   Notes:     Task: Alleviate Barriers to Hypertension Treatment Completed 09/26/2021  Due Date: 09/06/2021  Note:   Care Management Activities:    - healthy diet promoted - medical nutrition therapy provided - medication side effects managed - pain assessed and managed - patient response to treatment assessed - quality of sleep assessed - reduction of dietary sodium encouraged - reduction in sedentary activities encouraged    Notes:     Care Plan : [place] of Care  Updates made by Michiel Cowboy, RN since 09/26/2021 12:00 AM     Problem: Knowledge Deficit Related to Hypertension   Priority: High     Long-Range Goal: Development of Plan of Care for the Management of Hypertension   Start Date: 09/26/2021  Expected End Date: 10/07/2021  Priority: High  Note:   Current Barriers:  Chronic Disease Management support and education needs related to HTN   RNCM Clinical Goal(s):  Patient will demonstrate Ongoing adherence to prescribed treatment plan for HTN as evidenced by maintaining B/P < 150/90; taking all medications as prescribed; adhering to a low sodium diet continue to work with RN Care Manager to address care management and care coordination needs related to  HTN as evidenced by adherence to CM Team Scheduled appointments through collaboration with RN Care manager, provider, and care team.   Interventions: Inter-disciplinary care team collaboration (see longitudinal plan of care) Evaluation  of current treatment plan related to  self management and patient's adherence to plan as established by provider   Hypertension Interventions:  (Status:  Goal on track:  Yes.) Long Term Goal Last practice recorded BP readings:  BP Readings from Last 3 Encounters:  02/05/21 (!) 102/55  01/10/21 (!) 150/82  07/27/20 (!) 157/58  Most recent eGFR/CrCl: No results found for: EGFR  No components found for: CRCL  Evaluation of current treatment plan related to hypertension self management and patient's adherence to plan as established by provider Provided education to patient re: stroke prevention, s/s of heart attack and stroke Reviewed medications with patient and discussed importance of compliance Provided assistance with obtaining home blood pressure monitor via patient confirmed she received the B/P monitor this nurse sent previously; Discussed plans with patient for ongoing care management follow up and provided patient with direct contact  information for care management team Advised patient, providing education and rationale, to monitor blood pressure daily and record, calling PCP for findings outside established parameters Nurse previously sent patient hypertension education Encouraged continuation of adhering to a low sodium diet  Encouraged continuation of walking in her hallway 3 times a day for 5 minutes to increase mobility and strength  Patient Goals/Self-Care Activities: Take all medications as prescribed Attend all scheduled provider appointments Call pharmacy for medication refills 3-7 days in advance of running out of medications Call provider office for new concerns or questions  check blood pressure daily write blood pressure results in a log or diary take blood pressure log to all doctor appointments call doctor for signs and symptoms of high blood pressure take medications for blood pressure exactly as prescribed eat more whole grains, fruits and vegetables, lean meats  and healthy fats continue to adhering to a low sodium diet  Continue to walking in her hallway 3 times a day for 5 minutes to increase mobility and strength Start to walk in place in front of your chair with you walker in front to grab if needed to further gain leg strength   Follow Up Plan:  Telephone follow up appointment with care management team member scheduled for:  April       Plan: Telephone follow up appointment with care management team member scheduled for:  April. Nurse will send PCP a quarterly update.  Emelia Loron RN, [place] [phone] [email or web address]

## 2021-09-26 NOTE — Patient Instructions (Signed)
Visit Information  Thank you for taking time to visit with me today. Please don't hesitate to contact me if I can be of assistance to you before our next scheduled telephone appointment.  Following are the goals we discussed today:  Patient Goals/Self-Care Activities: Take all medications as prescribed Attend all scheduled provider appointments Call pharmacy for medication refills 3-7 days in advance of running out of medications Call provider office for new concerns or questions  check blood pressure daily write blood pressure results in a log or diary take blood pressure log to all doctor appointments call doctor for signs and symptoms of high blood pressure take medications for blood pressure exactly as prescribed eat more whole grains, fruits and vegetables, lean meats and healthy fats continue to adhering to a low sodium diet  Continue to walking in her hallway 3 times a day for 5 minutes to increase mobility and strength Start to walk in place in front of your chair with you walker in front to grab if needed to further gain leg strength   The patient verbalized understanding of instructions, educational materials, and care plan provided today and agreed to receive a mailed copy of patient instructions, educational materials, and care plan.   Telephone follow up appointment with care management team member scheduled [id number]  Emelia Loron RN, [place] [phone] [email or web address]

## 2021-10-02 NOTE — Progress Notes (Signed)
Carelink Summary Report / Loop Recorder 

## 2021-10-18 ENCOUNTER — Ambulatory Visit: Payer: Medicare Other | Admitting: Cardiology

## 2021-10-18 ENCOUNTER — Telehealth: Payer: Self-pay | Admitting: Cardiology

## 2021-10-18 ENCOUNTER — Encounter: Payer: Self-pay | Admitting: Cardiology

## 2021-10-18 ENCOUNTER — Other Ambulatory Visit: Payer: Self-pay

## 2021-10-18 VITALS — BP 130/60 | HR 60 | Ht 63.0 in | Wt 139.0 lb

## 2021-10-18 DIAGNOSIS — I2510 Atherosclerotic heart disease of native coronary artery without angina pectoris: Secondary | ICD-10-CM | POA: Diagnosis not present

## 2021-10-18 DIAGNOSIS — I350 Nonrheumatic aortic (valve) stenosis: Secondary | ICD-10-CM | POA: Diagnosis not present

## 2021-10-18 DIAGNOSIS — I4891 Unspecified atrial fibrillation: Secondary | ICD-10-CM | POA: Diagnosis not present

## 2021-10-18 MED ORDER — DILTIAZEM HCL ER COATED BEADS 180 MG PO CP24: 180.0000 mg | ORAL_CAPSULE | Freq: Every day | ORAL | 3 refills | Status: DC

## 2021-10-18 NOTE — Telephone Encounter (Signed)
Checking percert on the following patient for testing scheduled at [facility].     ECHO   02/20/2022

## 2021-10-18 NOTE — Patient Instructions (Addendum)
Medication Instructions:  INCREASE Diltiazem 180 mg tablets daily  Labwork: CBC BMET FASTING LIPID  Follow-Up: Follow up with Dr. Harl Bowie in 6 months.  Any Other Special Instructions Will Be Listed Below (If Applicable).  Your physician has requested that you have an echocardiogram. Echocardiography is a painless test that uses sound waves to create images of your heart. It provides your doctor with information about the size and shape of your heart and how well your hearts chambers and valves are working. This procedure takes approximately one hour. There are no restrictions for this procedure.     If you need a refill on your cardiac medications before your next appointment, please call your pharmacy.

## 2021-10-18 NOTE — Progress Notes (Signed)
Clinical Summary Ms. Sferrazza is a 81 y.o.femaleseen today for follow up of the following medical problems.    1. PAF/Prior history of PSVT - history of cryptogenic stroke, loop recorder placed and detected afib - seen in afib clinic.  - PAF, she is on diltiazem and toprol. Eliquis for stroke prevention.         - occasional palpitatins at times -  2. CAD - history of MI in 1995, received 2 stents to RCA -  cath 2007 with patent coronaries.  - cath Jan 2018 nonobstructive disease - echo 10/2016 LVEF 65-70%, no WMAs   - admit 10/2016 with elevated troponin to 1.33, Thought to be demand ischemia related to her SVT at the time - admit 12/2016 with chest pain. Mild troponin 0.32 at that time. Managed medically due to recent normal cath and stable echo     - no chest pains. Took NG last month, no recent use.    3. Carotid stenosis - 04/2016 moderate bilateral disease  08/2018 mild  12/2019 CTA: <50% bilateral disease - no recent issues   4. Cryptogenic cirrhosis  - followed by GI   5. Aortic valve disease - 06/2018 moderate to severe AI, LVEF 65-70%, no LVIDs reported. Does not mention aortic stenosis however mean grad 27 but no AVA VTI given. From 04/2018 study had moderate AS with mean grad 21 and AVA VTI 1.06   12/2019 echo mean grad 19, AVA VTI 1.14 - no recent symptoms  02/2021 echo: LVEF 60-65%, no WMAs, indet diastolic, mod MS(mean grad 5), mod to severe AS(mean grad 26, AVA VTI 0.87, DI 0.34, SVI 32     6. Mitral stenosis -12/2019 echo mean grad 5 - 02/2021 echo: LVEF 60-65%, no WMAs, indet diastolic, mod MS(mean grad 5), mod to severe AS(mean grad 26, AVA VTI 0.87, DI 0.34, SVI 32   7. History of CVA - admittion 07/2018, 12/2019  - has loop recorder for history of cryptogenic stroke.    8. Hyperlipidemia 06/2018 TC 109 TG 50 HDL 49 LDL 50 12/2019 TC 162 HDL 63 TG 78 LDL 83 -has been on atorvastatin 75m long term   - labs followed by pcp    9. HTN - compliant  with meds  10. Leg weakness - typically in wheel chair, but can use walker for short distances.  Past Medical History:  Diagnosis Date   Anemia    Aortic insufficiency    Moderate   Atrial fibrillation (HCC)    Back pain    Carotid stenosis, bilateral    Cirrhosis ([facility]    Closed left femoral fracture (HCC) 04/21/2017   Coronary artery disease    Stent x 2 RCA 1995, cardiac catheterization 09/2016 showing only mild atherosclerosis   DDD (degenerative disc disease), lumbar    Diabetes mellitus without complication (HCC)    Essential hypertension    Falls    GERD (gastroesophageal reflux disease)    Hiatal hernia    History of stroke    Hypercholesteremia    IBS (irritable bowel syndrome)    Non-alcoholic fatty liver disease    OSA (obstructive sleep apnea)    no CPAP   Pericardial effusion    a. small by echo 2018.   PSVT (paroxysmal supraventricular tachycardia) (HCC)    Recurrent UTI    Seizures ([facility] 2018   Stroke ([facility] [place] 07/2019   several strokes within the last 10 years   Swallowing difficulty    Varices, esophageal ([facility]  Allergies  Allergen Reactions   Tape Rash and Other (See Comments)    Please use paper tape     Current Outpatient Medications  Medication Sig Dispense Refill   acetaminophen (TYLENOL) 500 MG tablet Take 1,000 mg by mouth every 6 (six) hours as needed for moderate pain.     apixaban (ELIQUIS) 5 MG TABS tablet TAKE (1) TABLET BY MOUTH TWICE DAILY. 60 tablet 1   atorvastatin (LIPITOR) 80 MG tablet Take 1 tablet (80 mg total) by mouth daily. 30 tablet 1   azelastine (ASTELIN) 0.1 % nasal spray Place 1 spray into both nostrils 2 (two) times daily.     calcium citrate (CALCITRATE - DOSED IN MG ELEMENTAL CALCIUM) 950 MG tablet Take 1 tablet (200 mg of elemental calcium total) by mouth daily. (Patient taking differently: Take 0.5 tablets by mouth daily.) 30 tablet 1   carvedilol (COREG) 6.25 MG tablet Take 1 tablet (6.25 mg total) by mouth 2  (two) times daily with a meal. 180 tablet 4   Coenzyme Q10 100 MG capsule Take 100 mg by mouth daily.     diltiazem (CARDIZEM CD) 120 MG 24 hr capsule TAKE (1) CAPSULE BY MOUTH ONCE DAILY. 90 capsule 1   furosemide (LASIX) 20 MG tablet Take 20 mg by mouth daily.     furosemide (LASIX) 40 MG tablet TAKE (1) TABLET BY MOUTH ONCE DAILY. MAY TAKE ADDITIONAL HALF IF NEEDED FOR SEVERE SWELLING. 45 tablet 0   HYDROcodone-acetaminophen (NORCO) 10-325 MG tablet Take 1 tablet by mouth as needed for pain. (Patient not taking: Reported on 09/26/2021)     methocarbamol (ROBAXIN) 500 MG tablet Take 500 mg by mouth 3 (three) times daily as needed for muscle spasms. (Patient not taking: Reported on 03/05/2021)     mirtazapine (REMERON) 45 MG tablet Take 45 mg by mouth at bedtime.     Multiple Vitamins-Minerals (MULTIVITAMIN GUMMIES ADULT PO) Take 2 tablets by mouth daily.     nitroGLYCERIN (NITROSTAT) 0.4 MG SL tablet Place under the tongue.     omeprazole (PRILOSEC) 20 MG capsule TAKE (2) CAPSULES BY MOUTH DAILY BEFORE A MEAL FOR 15 DAYS. 90 capsule 0   ondansetron (ZOFRAN) 4 MG tablet Take 4 mg by mouth every 8 (eight) hours as needed for nausea or vomiting.     polyethylene glycol powder (GLYCOLAX/MIRALAX) 17 GM/SCOOP powder Take 8.5 g by mouth daily. 255 g 0   Propylene Glycol (SYSTANE COMPLETE OP) Place 1 drop into both eyes in the morning and at bedtime.     No current facility-administered medications for this visit.     Past Surgical History:  Procedure Laterality Date   ANAL FISSURE [place]   CARDIAC CATHETERIZATION  [phone]   Stent to the proximal RCA after MI    CARDIAC CATHETERIZATION N/A 09/26/2016   Procedure: Left Heart Cath and Coronary Angiography;  Surgeon: Leonie Man, MD;  Location: [facility];  Service: Cardiovascular;  Laterality: N/A;   CATARACT EXTRACTION W/PHACO Right 07/04/2014   Procedure: CATARACT EXTRACTION PHACO  AND INTRAOCULAR LENS PLACEMENT (IOC);  Surgeon: Elta Guadeloupe T. Gershon Crane, MD;  Location: [facility];  Service: Ophthalmology;  Laterality: Right;  CDE:13.13   COLONOSCOPY     DILATION AND CURETTAGE OF UTERUS     x2   Epi Retinal Membrane Peel Left    ERCP     ESOPHAGEAL BANDING N/A 04/01/2013   Procedure: ESOPHAGEAL BANDING;  Surgeon: Rogene Houston, MD;  [facility];  Service: Endoscopy;  Laterality: N/A;   ESOPHAGEAL BANDING N/A 05/24/2013   Procedure: ESOPHAGEAL BANDING;  Surgeon: Rogene Houston, MD;  [facility];  Service: Endoscopy;  Laterality: N/A;   ESOPHAGEAL BANDING N/A 06/21/2014   Procedure: ESOPHAGEAL BANDING;  Surgeon: Rogene Houston, MD;  [facility];  Service: Endoscopy;  Laterality: N/A;   ESOPHAGEAL BANDING  06/15/2020   Procedure: ESOPHAGEAL BANDING;  Surgeon: Harvel Quale, MD;  [facility];  Service: Gastroenterology;;   ESOPHAGOGASTRODUODENOSCOPY N/A 04/01/2013   Procedure: ESOPHAGOGASTRODUODENOSCOPY (EGD);  Surgeon: Rogene Houston, MD;  [facility];  Service: Endoscopy;  Laterality: N/A;  230-rescheduled to 8:30am Ann notified pt   ESOPHAGOGASTRODUODENOSCOPY N/A 05/24/2013   Procedure: ESOPHAGOGASTRODUODENOSCOPY (EGD);  Surgeon: Rogene Houston, MD;  [facility];  Service: Endoscopy;  Laterality: N/A;  730   ESOPHAGOGASTRODUODENOSCOPY N/A 06/21/2014   Procedure: ESOPHAGOGASTRODUODENOSCOPY (EGD);  Surgeon: Rogene Houston, MD;  [facility];  Service: Endoscopy;  Laterality: N/A;  930-rescheduled 10/14 @ 1200 Ann to notify pt   ESOPHAGOGASTRODUODENOSCOPY (EGD) WITH PROPOFOL N/A 06/15/2020   Procedure: ESOPHAGOGASTRODUODENOSCOPY (EGD) WITH PROPOFOL;  Surgeon: Harvel Quale, MD;  [facility];  Service: Gastroenterology;  Laterality: N/A;  230   ESOPHAGOGASTRODUODENOSCOPY (EGD) WITH PROPOFOL N/A 07/27/2020   Procedure: ESOPHAGOGASTRODUODENOSCOPY (EGD) WITH PROPOFOL;   Surgeon: Harvel Quale, MD;  [facility];  Service: Gastroenterology;  Laterality: N/A;  1245   EYE SURGERY  08   cataract surgery of the left eye   FEMUR IM NAIL Left 03/02/2017   Procedure: INTRAMEDULLARY (IM) NAIL FEMORAL;  Surgeon: Rod Can, MD;  Location: [facility];  Service: Orthopedics;  Laterality: Left;   HARDWARE REMOVAL Right 01/17/2013   Procedure: REMOVAL OF HARDWARE AND EXCISION ULNAR STYLOID RIGHT WRIST;  Surgeon: Tennis Must, MD;  Location: [facility];  Service: Orthopedics;  Laterality: Right;   implanted heart monitor  02/2020   to monitor Afib   MYRINGOTOMY  2012   both ears   ORIF FEMUR FRACTURE Left 04/23/2017   Procedure: OPEN REDUCTION INTERNAL FIXATION (ORIF) DISTAL FEMUR FRACTURE (FRACTURE AROUND FEMORAL NAIL);  Surgeon: Altamese Milligan, MD;  Location: [facility];  Service: Orthopedics;  Laterality: Left;   TONSILLECTOMY     TUBAL LIGATION     VAGINAL HYSTERECTOMY  1972   WRIST SURGERY     rt wrist hardwear removal     Allergies  Allergen Reactions   Tape Rash and Other (See Comments)    Please use paper tape      Family History  Problem Relation Age of Onset   Diabetes Mother    Heart failure Father    Heart failure Maternal Aunt      Social History Ms. Treanor reports that she has been smoking cigarettes. She has a 12.50 pack-year smoking history. She has never used smokeless tobacco. Ms. Podolak reports no history of alcohol use.   Review of Systems CONSTITUTIONAL: No weight loss, fever, chills, weakness or fatigue.  HEENT: Eyes: No visual loss, blurred vision, double vision or yellow sclerae.No hearing loss, sneezing, congestion, runny nose or sore throat.  SKIN: No rash or itching.  CARDIOVASCULAR: per hpi RESPIRATORY: No shortness of breath, cough or sputum.  GASTROINTESTINAL: No anorexia, nausea, vomiting or diarrhea. No abdominal pain or blood.  GENITOURINARY: No burning on urination, no  polyuria NEUROLOGICAL: No headache,  dizziness, syncope, paralysis, ataxia, numbness or tingling in the extremities. No change in bowel or bladder control.  MUSCULOSKELETAL: No muscle, back pain, joint pain or stiffness.  LYMPHATICS: No enlarged nodes. No history of splenectomy.  PSYCHIATRIC: No history of depression or anxiety.  ENDOCRINOLOGIC: No reports of sweating, cold or heat intolerance. No polyuria or polydipsia.  Marland Kitchen   Physical Examination Today's Vitals   10/18/21 0857  BP: 130/60  Pulse: 60  SpO2: 97%  Weight: 139 lb (63 kg)  Height: _0  (1.6 m)   Body mass index is 24.62 kg/m.  Gen: resting comfortably, no acute distress HEENT: no scleral icterus, pupils equal round and reactive, no palptable cervical adenopathy,  CV: irreg, 2/6 systolic murmur rusb, no jvd Resp: Clear to auscultation bilaterally GI: abdomen is soft, non-tender, non-distended, normal bowel sounds, no hepatosplenomegaly MSK: extremities are warm, no edema.  Skin: warm, no rash Neuro:  no focal deficits Psych: appropriate affect   Diagnostic Studies 06/2018 echo Study Conclusions   - Left ventricle: The cavity size was normal. Wall thickness was   increased in a pattern of moderate LVH. Systolic function was   vigorous. The estimated ejection fraction was in the range of 65%   to 70%. Features are consistent with a pseudonormal left   ventricular filling pattern, with concomitant abnormal relaxation   and increased filling pressure (grade 2 diastolic dysfunction). - Aortic valve: Mildly calcified annulus. There was moderate to   severe regurgitation. - Mitral valve: There was mild to moderate regurgitation. - Left atrium: The atrium was severely dilated.   Impressions:   - Technically difficult echo   No parasternal views.     LV systolic function   The aortic insufficiency is at least moderate - severe in   severity .     08/2018 carotid US Summary: Right Carotid: Velocities in the  right ICA are consistent with a 1-39% stenosis.   Left Carotid: Velocities in the left ICA are consistent with a 1-39% stenosis,               calcific plaque may obscure higher velicity.   12/2019 echo 1. Left ventricular ejection fraction, by estimation, is 60 to 65%. The  left ventricle has normal function. The left ventricle has no regional  wall motion abnormalities. There is mild left ventricular hypertrophy.  Left ventricular diastolic parameters  are consistent with Grade II diastolic dysfunction (pseudonormalization).  Elevated left atrial pressure.   2. Right ventricular systolic function is normal. The right ventricular  size is normal. There is mildly elevated pulmonary artery systolic  pressure.   3. Left atrial size was severely dilated.   4. The mitral valve was not well visualized. Mild mitral valve  regurgitation. Moderate mitral stenosis.MV peak gradient, 13.4 mmHg. The  mean mitral valve gradient is 5.0 mmHg.   5. The aortic valve was not well visualized. Aortic valve regurgitation  is moderate. Moderate aortic valve stenosis.   6. The inferior vena cava is normal in size with greater than 50%  respiratory variability, suggesting right atrial pressure of 3 mmHg.   02/2021 echo IMPRESSIONS     1. Left ventricular ejection fraction, by estimation, is 60 to 65%. The  left ventricle has normal function. The left ventricle has no regional  wall motion abnormalities. There is mild left ventricular hypertrophy.  Left ventricular diastolic parameters  are indeterminate.   2. Right ventricular systolic function is normal. The right ventricular  size is normal.  3. Left atrial size was severely dilated.   4. Right atrial size was moderately dilated.   5. Previous MV gradients on TTE 01/05/20 mean 5 peak 13.4 mmHg at a HR of  68 bpm Gradients similar today at higher HR of 100 bpm MS primarily from  MAC especially posterior annulus and subchordal area . The mitral valve is   degenerative. Trivial mitral  valve regurgitation. Moderate mitral stenosis. Severe mitral annular  calcification.   6. Gradients have increased since echo 01/05/20 Degree of AR similar . The  aortic valve is tricuspid. There is moderate calcification of the aortic  valve. Aortic valve regurgitation is moderate. Moderate to severe aortic  valve stenosis.   7. The inferior vena cava is dilated in size with <50% respiratory  variability, suggesting right atrial pressure of 15 mmHg.   Assessment and Plan   1. Afib/prior PSVT - some ongogn symptoms, increase ditliazem to 158m daily - continue eliquis.      2.  CAD -overall doing well, continue current meds   3.Aortic stenosis/Mitral stenosis - last echo mod to severe AS, mod MS - continue to monitor at this time, repeat echo 02/2022  F/u 6 months   JArnoldo Lenis M.D

## 2021-10-19 IMAGING — MG MM DIGITAL SCREENING BILAT W/ TOMO AND CAD
8 series · 8 of 24 positions shown · non-contrast
Comparison: Previous exam(s).

CLINICAL DATA: Screening. Please note, the patient could not
tolerate optimal positioning due to limited mobility. The best
possible images were obtained.

EXAM:
DIGITAL SCREENING BILATERAL MAMMOGRAM WITH TOMOSYNTHESIS AND CAD
TECHNIQUE: Bilateral screening digital craniocaudal and mediolateral oblique
mammograms were obtained. Bilateral screening digital breast
tomosynthesis was performed. The images were evaluated with
computer-aided detection.

[R MLO synth-2D]
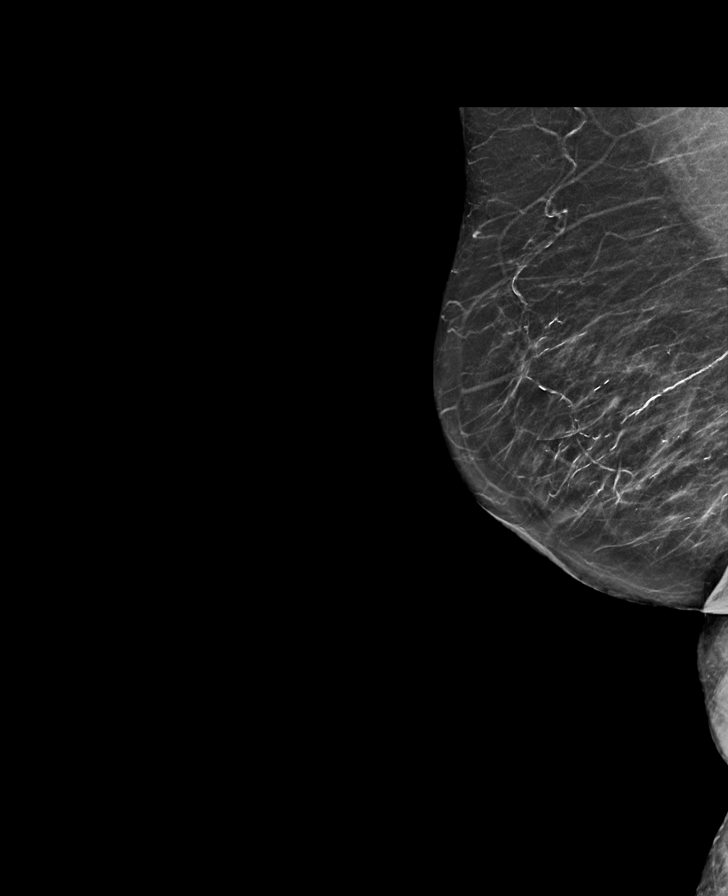

[R CC synth-2D]
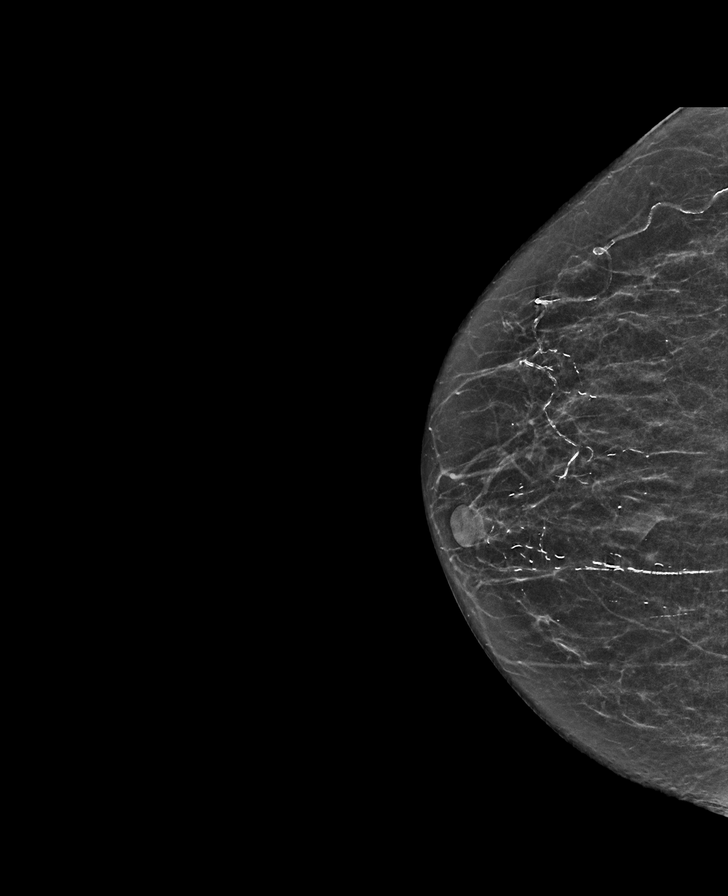

[L MLO synth-2D]
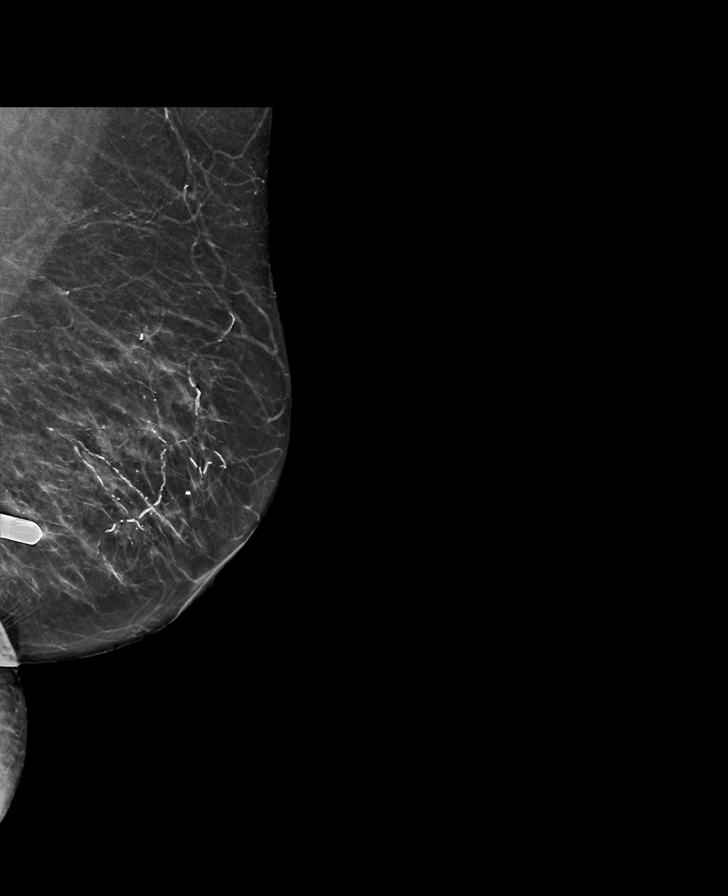

[L CC synth-2D]
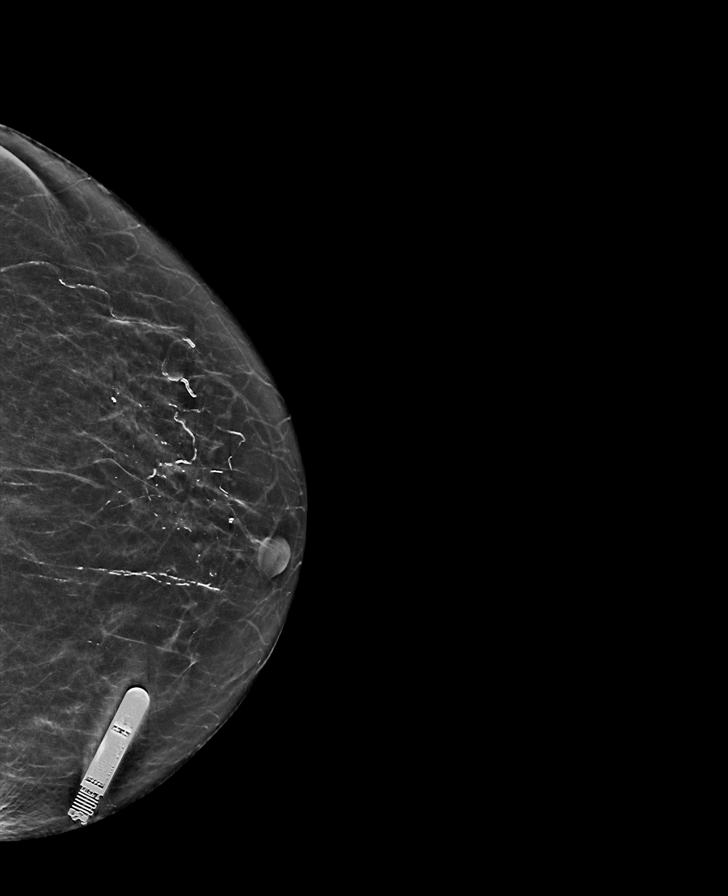

[R MLO tomo · tomo slice 29/58.0]
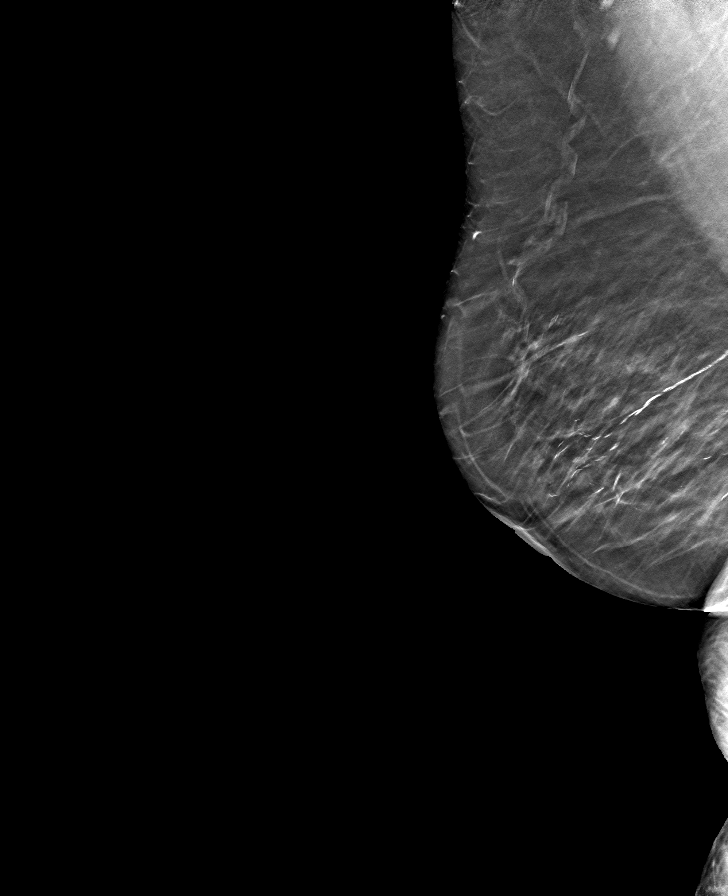

[L MLO tomo · tomo slice 31/62.0]
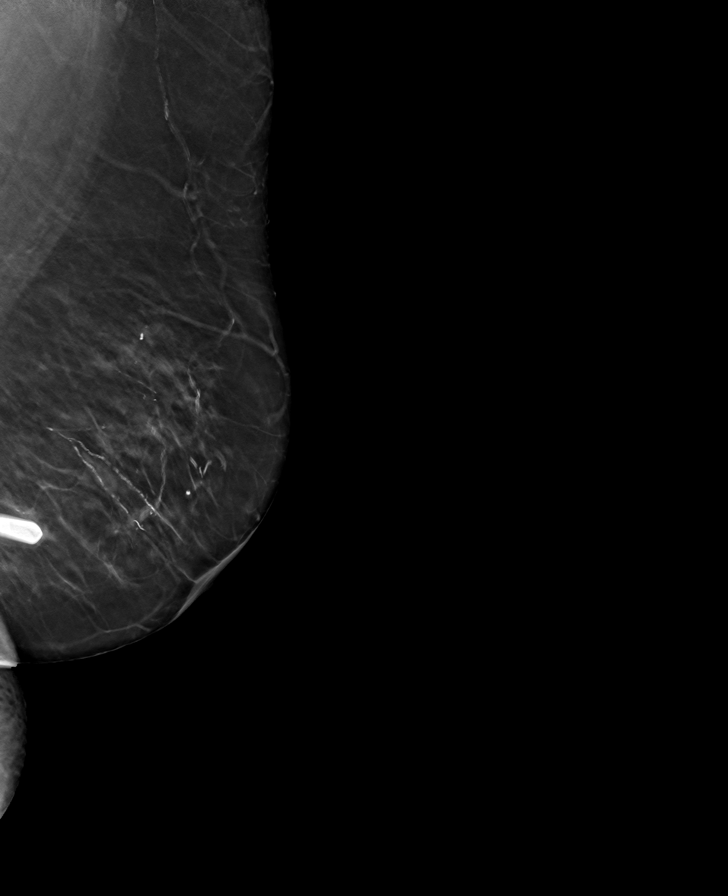

[L CC tomo · tomo slice 30/59.0]
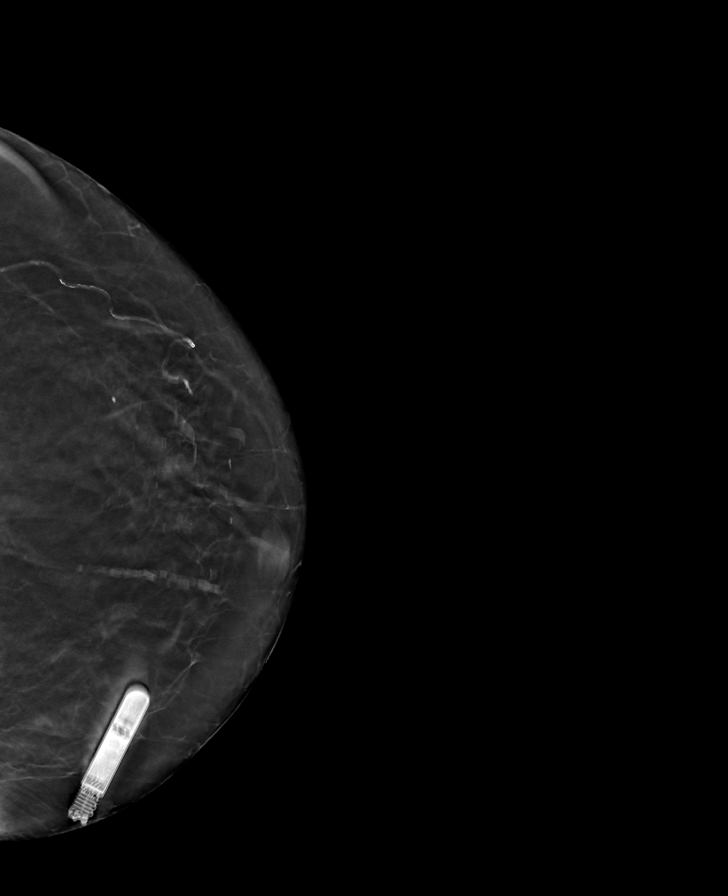

[R CC tomo · tomo slice 27/54.0]
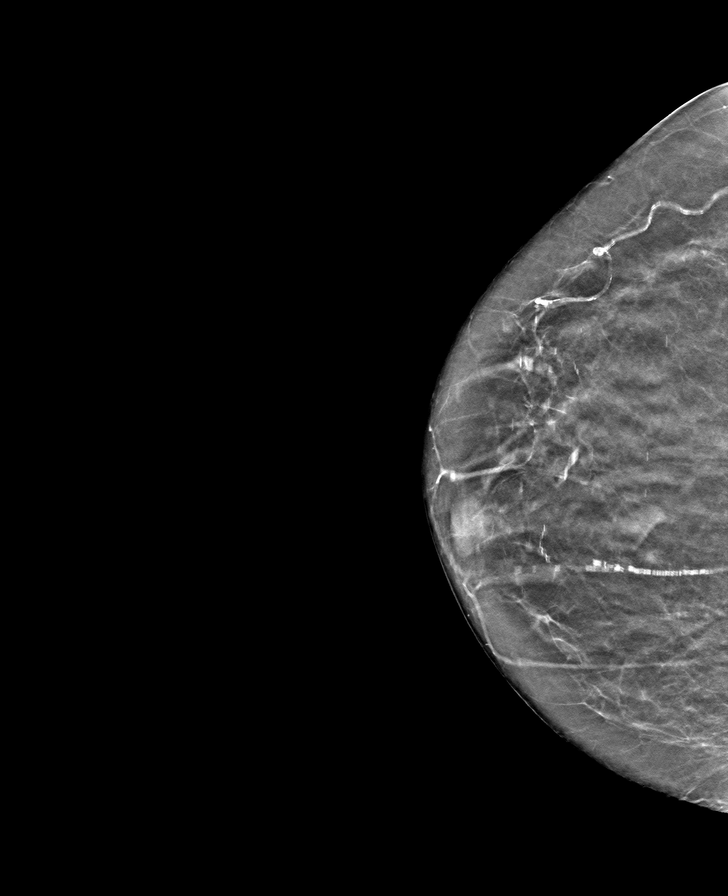

[8 of 24 positions shown; findings below may reference images not displayed]

ACR Breast Density Category b: There are scattered areas of
fibroglandular density.
FINDINGS: There are no findings suspicious for malignancy.
IMPRESSION: No mammographic evidence of malignancy. A result letter of this
screening mammogram will be mailed directly to the patient.

RECOMMENDATION:
Screening mammogram in one year. (Code:DX-E-7DX)

BI-RADS CATEGORY  1: Negative.

## 2021-10-25 ENCOUNTER — Other Ambulatory Visit (HOSPITAL_COMMUNITY): Payer: Medicare Other

## 2021-10-28 ENCOUNTER — Ambulatory Visit (INDEPENDENT_AMBULATORY_CARE_PROVIDER_SITE_OTHER): Payer: Medicare Other

## 2021-10-28 DIAGNOSIS — I639 Cerebral infarction, unspecified: Secondary | ICD-10-CM | POA: Diagnosis not present

## 2021-10-28 LAB — CUP PACEART REMOTE DEVICE CHECK
Date Time Interrogation Session: 20230217232427
Implantable Pulse Generator Implant Date: 20210602

## 2021-10-31 NOTE — Progress Notes (Signed)
Carelink Summary Report / Loop Recorder 

## 2021-11-04 DIAGNOSIS — H903 Sensorineural hearing loss, bilateral: Secondary | ICD-10-CM | POA: Diagnosis not present

## 2021-11-04 DIAGNOSIS — H6122 Impacted cerumen, left ear: Secondary | ICD-10-CM | POA: Diagnosis not present

## 2021-11-04 DIAGNOSIS — H7202 Central perforation of tympanic membrane, left ear: Secondary | ICD-10-CM | POA: Diagnosis not present

## 2021-11-12 ENCOUNTER — Other Ambulatory Visit (INDEPENDENT_AMBULATORY_CARE_PROVIDER_SITE_OTHER): Payer: Self-pay | Admitting: Gastroenterology

## 2021-11-12 DIAGNOSIS — I8510 Secondary esophageal varices without bleeding: Secondary | ICD-10-CM

## 2021-11-12 NOTE — Telephone Encounter (Signed)
06/04/2020 last seen.  ?

## 2021-11-12 NOTE — Telephone Encounter (Signed)
Will refill medication for 3 months, needs follow up appointment with any provider in order to receive any refills. ? ?Thanks, ? ?Maylon Peppers, MD ?Gastroenterology and Hepatology ?[facility] ? ?

## 2021-11-12 NOTE — Telephone Encounter (Signed)
Needs office visit.

## 2021-11-14 ENCOUNTER — Other Ambulatory Visit: Payer: Self-pay | Admitting: Cardiology

## 2021-11-14 NOTE — Telephone Encounter (Signed)
Prescription refill request for Eliquis received. ?Indication: afib  ?Last office visit: Brn[place], 10/18/2021 ?Scr: 0.79, 01/30/2021 ?Age: 81 yo  ?Weight: 63 kg  ? ?Refill sent  ?

## 2021-11-15 DIAGNOSIS — N6320 Unspecified lump in the left breast, unspecified quadrant: Secondary | ICD-10-CM | POA: Diagnosis not present

## 2021-11-15 DIAGNOSIS — Z6824 Body mass index (BMI) 24.0-24.9, adult: Secondary | ICD-10-CM | POA: Diagnosis not present

## 2021-11-21 ENCOUNTER — Other Ambulatory Visit (HOSPITAL_COMMUNITY): Payer: Self-pay | Admitting: Internal Medicine

## 2021-11-21 DIAGNOSIS — N6320 Unspecified lump in the left breast, unspecified quadrant: Secondary | ICD-10-CM

## 2021-12-02 ENCOUNTER — Ambulatory Visit (INDEPENDENT_AMBULATORY_CARE_PROVIDER_SITE_OTHER): Payer: Medicare Other

## 2021-12-02 DIAGNOSIS — I639 Cerebral infarction, unspecified: Secondary | ICD-10-CM

## 2021-12-04 LAB — CUP PACEART REMOTE DEVICE CHECK
Date Time Interrogation Session: 20230326230850
Implantable Pulse Generator Implant Date: 20210602

## 2021-12-05 ENCOUNTER — Emergency Department (HOSPITAL_COMMUNITY): Payer: Medicare Other

## 2021-12-05 ENCOUNTER — Observation Stay (HOSPITAL_COMMUNITY)
Admission: EM | Admit: 2021-12-05 | Discharge: 2021-12-06 | Disposition: A | Discharge status: Home Health | Payer: Medicare Other | Attending: Family Medicine | Admitting: Family Medicine

## 2021-12-05 ENCOUNTER — Encounter (HOSPITAL_COMMUNITY): Payer: Self-pay | Admitting: Emergency Medicine

## 2021-12-05 DIAGNOSIS — I2510 Atherosclerotic heart disease of native coronary artery without angina pectoris: Secondary | ICD-10-CM | POA: Diagnosis not present

## 2021-12-05 DIAGNOSIS — Z79899 Other long term (current) drug therapy: Secondary | ICD-10-CM | POA: Diagnosis not present

## 2021-12-05 DIAGNOSIS — I351 Nonrheumatic aortic (valve) insufficiency: Secondary | ICD-10-CM | POA: Diagnosis present

## 2021-12-05 DIAGNOSIS — E876 Hypokalemia: Secondary | ICD-10-CM | POA: Diagnosis not present

## 2021-12-05 DIAGNOSIS — I471 Supraventricular tachycardia, unspecified: Secondary | ICD-10-CM | POA: Diagnosis present

## 2021-12-05 DIAGNOSIS — Z8673 Personal history of transient ischemic attack (TIA), and cerebral infarction without residual deficits: Secondary | ICD-10-CM | POA: Diagnosis not present

## 2021-12-05 DIAGNOSIS — I639 Cerebral infarction, unspecified: Secondary | ICD-10-CM | POA: Diagnosis not present

## 2021-12-05 DIAGNOSIS — Z7901 Long term (current) use of anticoagulants: Secondary | ICD-10-CM | POA: Insufficient documentation

## 2021-12-05 DIAGNOSIS — E7800 Pure hypercholesterolemia, unspecified: Secondary | ICD-10-CM | POA: Diagnosis present

## 2021-12-05 DIAGNOSIS — Z20822 Contact with and (suspected) exposure to covid-19: Secondary | ICD-10-CM | POA: Diagnosis not present

## 2021-12-05 DIAGNOSIS — R2689 Other abnormalities of gait and mobility: Secondary | ICD-10-CM | POA: Insufficient documentation

## 2021-12-05 DIAGNOSIS — R000 Tachycardia, unspecified: Secondary | ICD-10-CM | POA: Diagnosis not present

## 2021-12-05 DIAGNOSIS — K219 Gastro-esophageal reflux disease without esophagitis: Secondary | ICD-10-CM | POA: Diagnosis present

## 2021-12-05 DIAGNOSIS — I10 Essential (primary) hypertension: Secondary | ICD-10-CM | POA: Diagnosis not present

## 2021-12-05 DIAGNOSIS — I249 Acute ischemic heart disease, unspecified: Secondary | ICD-10-CM | POA: Diagnosis present

## 2021-12-05 DIAGNOSIS — F17210 Nicotine dependence, cigarettes, uncomplicated: Secondary | ICD-10-CM | POA: Insufficient documentation

## 2021-12-05 DIAGNOSIS — Z743 Need for continuous supervision: Secondary | ICD-10-CM | POA: Diagnosis not present

## 2021-12-05 DIAGNOSIS — M47812 Spondylosis without myelopathy or radiculopathy, cervical region: Secondary | ICD-10-CM | POA: Diagnosis not present

## 2021-12-05 DIAGNOSIS — G4733 Obstructive sleep apnea (adult) (pediatric): Secondary | ICD-10-CM | POA: Insufficient documentation

## 2021-12-05 DIAGNOSIS — I6523 Occlusion and stenosis of bilateral carotid arteries: Secondary | ICD-10-CM | POA: Diagnosis not present

## 2021-12-05 DIAGNOSIS — Z8719 Personal history of other diseases of the digestive system: Secondary | ICD-10-CM

## 2021-12-05 DIAGNOSIS — I6381 Other cerebral infarction due to occlusion or stenosis of small artery: Secondary | ICD-10-CM | POA: Diagnosis not present

## 2021-12-05 DIAGNOSIS — K7460 Unspecified cirrhosis of liver: Secondary | ICD-10-CM | POA: Diagnosis present

## 2021-12-05 DIAGNOSIS — R404 Transient alteration of awareness: Secondary | ICD-10-CM | POA: Diagnosis not present

## 2021-12-05 DIAGNOSIS — I499 Cardiac arrhythmia, unspecified: Secondary | ICD-10-CM | POA: Diagnosis not present

## 2021-12-05 DIAGNOSIS — Z955 Presence of coronary angioplasty implant and graft: Secondary | ICD-10-CM | POA: Diagnosis not present

## 2021-12-05 DIAGNOSIS — R569 Unspecified convulsions: Secondary | ICD-10-CM

## 2021-12-05 DIAGNOSIS — D696 Thrombocytopenia, unspecified: Secondary | ICD-10-CM | POA: Diagnosis present

## 2021-12-05 DIAGNOSIS — G4489 Other headache syndrome: Secondary | ICD-10-CM | POA: Diagnosis not present

## 2021-12-05 DIAGNOSIS — I6621 Occlusion and stenosis of right posterior cerebral artery: Secondary | ICD-10-CM | POA: Diagnosis not present

## 2021-12-05 DIAGNOSIS — I6502 Occlusion and stenosis of left vertebral artery: Secondary | ICD-10-CM | POA: Diagnosis not present

## 2021-12-05 DIAGNOSIS — R531 Weakness: Secondary | ICD-10-CM | POA: Diagnosis present

## 2021-12-05 LAB — DIFFERENTIAL
Abs Immature Granulocytes: 0.01 10*3/uL (ref 0.00–0.07)
Basophils Absolute: 0 10*3/uL (ref 0.0–0.1)
Basophils Relative: 0 %
Eosinophils Absolute: 0.3 10*3/uL (ref 0.0–0.5)
Eosinophils Relative: 4 %
Immature Granulocytes: 0 %
Lymphocytes Relative: 21 %
Lymphs Abs: 1.4 10*3/uL (ref 0.7–4.0)
Monocytes Absolute: 0.6 10*3/uL (ref 0.1–1.0)
Monocytes Relative: 9 %
Neutro Abs: 4.3 10*3/uL (ref 1.7–7.7)
Neutrophils Relative %: 66 %

## 2021-12-05 LAB — COMPREHENSIVE METABOLIC PANEL
ALT: 20 U/L (ref 0–44)
AST: 30 U/L (ref 15–41)
Albumin: 3 g/dL — ABNORMAL LOW (ref 3.5–5.0)
Alkaline Phosphatase: 113 U/L (ref 38–126)
Anion gap: 5 (ref 5–15)
BUN: 9 mg/dL (ref 8–23)
CO2: 27 mmol/L (ref 22–32)
Calcium: 8.4 mg/dL — ABNORMAL LOW (ref 8.9–10.3)
Chloride: 108 mmol/L (ref 98–111)
Creatinine, Ser: 0.83 mg/dL (ref 0.44–1.00)
GFR, Estimated: >60 mL/min (ref >60)
Glucose, Bld: 173 mg/dL — ABNORMAL HIGH (ref 70–99)
Potassium: 2.7 mmol/L — CL (ref 3.5–5.1)
Sodium: 140 mmol/L (ref 135–145)
Total Bilirubin: 1.6 mg/dL — ABNORMAL HIGH (ref 0.3–1.2)
Total Protein: 5.6 g/dL — ABNORMAL LOW (ref 6.5–8.1)

## 2021-12-05 LAB — I-STAT CHEM 8, ED
BUN: 8 mg/dL (ref 8–23)
Calcium, Ion: 1.02 mmol/L — ABNORMAL LOW (ref 1.15–1.40)
Chloride: 103 mmol/L (ref 98–111)
Creatinine, Ser: 0.9 mg/dL (ref 0.44–1.00)
Glucose, Bld: 189 mg/dL — ABNORMAL HIGH (ref 70–99)
HCT: 39 % (ref 36.0–46.0)
Hemoglobin: 13.3 g/dL (ref 12.0–15.0)
Potassium: 3.8 mmol/L (ref 3.5–5.1)
Sodium: 142 mmol/L (ref 135–145)
TCO2: 29 mmol/L (ref 22–32)

## 2021-12-05 LAB — PROTIME-INR
INR: 1.8 — ABNORMAL HIGH (ref 0.8–1.2)
Prothrombin Time: 20.7 seconds — ABNORMAL HIGH (ref 11.4–15.2)

## 2021-12-05 LAB — CBC
HCT: 38.9 % (ref 36.0–46.0)
Hemoglobin: 13.2 g/dL (ref 12.0–15.0)
MCH: 31.9 pg (ref 26.0–34.0)
MCHC: 33.9 g/dL (ref 30.0–36.0)
MCV: 94 fL (ref 80.0–100.0)
Platelets: 149 10*3/uL — ABNORMAL LOW (ref 150–400)
RBC: 4.14 MIL/uL (ref 3.87–5.11)
RDW: 14.7 % (ref 11.5–15.5)
WBC: 6.6 10*3/uL (ref 4.0–10.5)
nRBC: 0 % (ref 0.0–0.2)

## 2021-12-05 LAB — ETHANOL: Alcohol, Ethyl (B): <10 mg/dL (ref <10)

## 2021-12-05 LAB — APTT: aPTT: 35 seconds (ref 24–36)

## 2021-12-05 MED ORDER — IOHEXOL 350 MG/ML SOLN
100.0000 mL | Freq: Once | INTRAVENOUS | Status: AC | PRN
  Administered 2021-12-06: 100 mL via INTRAVENOUS

## 2021-12-05 NOTE — ED Notes (Signed)
Pt returned from head CT. Multiple unsuccessful attempts made for PIV access. RN to bedside with ultrasound for U/S placed PIV.  ?

## 2021-12-05 NOTE — ED Provider Notes (Signed)
Care assumed from Dr. Tinnie Gens, patient with dizziness which has resolved, CT scanning showing questionable area of subacute stroke pending CT angiogram.  We will need to discuss with neurology after angiogram. ? ?CT angiogram shows significant narrowing of the V4 segment on the left, but no total occlusion.  Case was discussed with Dr. Lorrin Goodell of the neurology service who has reviewed the images and feels that this is likely a completed stroke and not amenable to interventional radiology.  She is also anticoagulated on apixaban so not a candidate for thrombolytic therapy.  He recommends hospital admission and stroke work-up.  Case is discussed with Dr. Clearence Ped of [facility], who agrees to admit the patient. ? ?CRITICAL CARE ?Performed by: Delora Fuel ?Total critical care time: 35 minutes ?Critical care time was exclusive of separately billable procedures and treating other patients. ?Critical care was necessary to treat or prevent imminent or life-threatening deterioration. ?Critical care was time spent personally by me on the following activities: development of treatment plan with patient and/or surrogate as well as nursing, discussions with consultants, evaluation of patient's response to treatment, examination of patient, obtaining history from patient or surrogate, ordering and performing treatments and interventions, ordering and review of laboratory studies, ordering and review of radiographic studies, pulse oximetry and re-evaluation of patient's condition. ?  ?Delora Fuel, MD ?32/67/12 [phone] ? ?

## 2021-12-05 NOTE — ED Triage Notes (Signed)
Pt brought in by [facility] code stroke [facility] 2145. Symptoms of N/V, left sided weakness with facial droop. Hx of vertigo, and CVA. ?

## 2021-12-05 NOTE — ED Notes (Signed)
CODE STROKE PAGED ?

## 2021-12-05 NOTE — ED Notes (Signed)
Neurology MD on monitor.  ?

## 2021-12-05 NOTE — Consult Note (Addendum)
[facility] ?[facility] Tele[facility]urology Consult Services ? ? ?Patient Name:   Barbara, [name] ?Date of Birth:   May 15, 1941 ?Identification Number:   MRN - [id number] ?Date of Service:   12/05/2021 23:23:37 ? ?Diagnosis: ?      G45.9 - Transient cerebral ischemic attack, unspecified ? ?Impression: ?     Patient presents to the hospital for symptoms concerning for TIA. Recommend admission for TIA work-up. Neuro follow-up recommended. ? ?Update: CT angiogram head and neck showed concern for increased attenuation with near occlusion of the intracranial left V4 segment, no large vessel occlusion.  No indication for endovascular intervention due to lack of LVO.  Recommend plan as above with admission for neurovascular work-up. ? ?Our recommendations are outlined below. ? ?Recommendations: ? ?      Stroke/Telemetry Floor ?      Neuro Checks ?      Bedside Swallow Eval ?      DVT Prophylaxis ?      IV Fluids, Normal Saline ?      Head of Bed 30 Degrees ?      Euglycemia and Avoid Hyperthermia (PRN Acetaminophen) ?      Toxic/metabolic/infx workup per ED including UA, UDS ?      MRI brain without IV contrast ?      If no cerebral blood vessel imaging done in ED (such as CTA), recommend MRA head/neck without contrast, or carotid ultrasound if cannot obtain MRA ?      TSH, A1c, lipid profile ?      Transthoracic echocardiogram ?      Continuous telemetry ?      Physical, occupational, and speech therapies ?      q4h neuro checks/NIHSS ?      NPO until bedside swallow ?      Neurology follow-up ? ?Per facility request will defer further work up, management, and referrals to inpatient service, inclusive of inpatient neurology consult. ? ?Sign Out: ?      Discussed with Emergency Department Provider ? ? ? ?------------------------------------------------------------------------------ ? ?Advanced Imaging: ?CTA Head and Neck Ordered: ? ? ?Metrics: ?Last Known Well: 12/05/2021 22:00:00 ?[facility] Notification Time:  12/05/2021 23:23:37 ?Arrival Time: 12/05/2021 22:29:00 ?Stamp Time: 12/05/2021 23:23:37 ?Initial Response Time: 12/05/2021 23:25:09 ?Symptoms: Left-sided weakness. ?NIHSS Start Assessment Time: 12/05/2021 23:27:38 ?Patient is not a candidate for Thrombolytic. ?Thrombolytic Medical Decision: 12/05/2021 23:26:55 ?Patient was not deemed candidate for Thrombolytic because of following reasons: ?Use of NOAs within 48 hours. ?Resolved symptoms (no residual disabling symptoms). ? ?I personally Reviewed the CT Head and it Showed no acute abnormalities ? ?ED Physician not notified of diagnostic impression and management plan because Attempted to call ED physician but unable to get a hold of anyone at this point time. Please call with any questions or concerns. ? ? ? ?------------------------------------------------------------------------------ ? ?History of Present Illness: ?Patient is a 81 year old Female. ? ?Patient was brought by EMS for symptoms of Left-sided weakness. ?Patient presents to the hospital for transient episode of left-sided weakness. Has a history of prior CVA without residual deficits, A-fib on Eliquis, CAD, hypertension, hyperlipidemia, diabetes. Reports that at around 10 PM this evening she began having left-sided weakness involving face, arm, and leg. She was having some nausea and vomiting and vertigo. All symptoms have since resolved. ? ? ?Past Medical History: ?Othere PMH:  See HPI Above ? ?Medications: ? ?Anticoagulant use:  Yes eliquis ?No Antiplatelet use ?Reviewed EMR for current medications ? ?Allergies:  ?Reviewed ? ?Social History: ?Drug Use:  No ? ?Family History: ? ?There is no family history of premature cerebrovascular disease pertinent to this consultation ? ?ROS : ?14 Points Review of Systems was performed and was negative except mentioned in HPI. ? ?Past Surgical History: ?There Is No Surgical History Contributory To Today?s Visit ? ? ? ?Examination: ?BP(/), Pulse(80), ?1A: Level of  Consciousness - Alert; keenly responsive + 0 ?1B: Ask Month and Age - Both Questions Right + 0 ?1C: Blink Eyes & Squeeze Hands - Performs Both Tasks + 0 ?2: Test Horizontal Extraocular Movements - Normal + 0 ?3: Test Visual Fields - No Visual Loss + 0 ?4: Test Facial Palsy (Use Grimace if Obtunded) - Normal symmetry + 0 ?5A: Test Left Arm Motor Drift - No Drift for 10 Seconds + 0 ?5B: Test Right Arm Motor Drift - No Drift for 10 Seconds + 0 ?6A: Test Left Leg Motor Drift - Drift, but doesn't hit bed + 1 ?6B: Test Right Leg Motor Drift - Drift, but doesn't hit bed + 1 ?7: Test Limb Ataxia (FNF/Heel-Shin) - No Ataxia + 0 ?8: Test Sensation - Normal; No sensory loss + 0 ?9: Test Language/Aphasia - Normal; No aphasia + 0 ?10: Test Dysarthria - Normal + 0 ?11: Test Extinction/Inattention - No abnormality + 0 ? ?NIHSS Score: 2 ? ? ?Pre-Morbid Modified Rankin Scale: ?4 Points = Moderately severe disability; unable to walk and attend to bodily needs without assistance ? ? ?Patient/Family was informed the Neurology Consult would occur via TeleHealth consult by way of interactive audio and video telecommunications and consented to receiving care in this manner. ? ? ?Patient is being evaluated for possible acute neurologic impairment and high probability of imminent or life-threatening deterioration. I spent total of 35 minutes providing care to this patient, including time for face to face visit via telemedicine, review of medical records, imaging studies and discussion of findings with providers, the patient and/or family. ? ? ?Dr Knox Royalty ? ? ?[facility] ?[phone] ? ? ?Case [id number] ? ?

## 2021-12-05 NOTE — Progress Notes (Signed)
2233 Code Stroke Activated, EDP and pt not in room.  ?2239 Pt in room ?CT delay d/t no available CT at this time and difficult IV access.  ?2315 Pt left for CT ?2323 Pt returned from CT, Neuro [facility] paged ?2326 Neuro [facility] on camera ?

## 2021-12-05 NOTE — Progress Notes (Signed)
Notified Dr. Maryan Rued (Neuro [facility]) of NCCT results.  ?

## 2021-12-06 ENCOUNTER — Observation Stay (HOSPITAL_BASED_OUTPATIENT_CLINIC_OR_DEPARTMENT_OTHER): Payer: Medicare Other

## 2021-12-06 ENCOUNTER — Other Ambulatory Visit: Payer: Self-pay

## 2021-12-06 ENCOUNTER — Observation Stay (HOSPITAL_COMMUNITY): Payer: Medicare Other

## 2021-12-06 ENCOUNTER — Other Ambulatory Visit (HOSPITAL_COMMUNITY): Payer: Self-pay | Admitting: *Deleted

## 2021-12-06 ENCOUNTER — Other Ambulatory Visit: Payer: Self-pay | Admitting: *Deleted

## 2021-12-06 DIAGNOSIS — I249 Acute ischemic heart disease, unspecified: Secondary | ICD-10-CM

## 2021-12-06 DIAGNOSIS — I351 Nonrheumatic aortic (valve) insufficiency: Secondary | ICD-10-CM | POA: Diagnosis not present

## 2021-12-06 DIAGNOSIS — I6621 Occlusion and stenosis of right posterior cerebral artery: Secondary | ICD-10-CM | POA: Diagnosis not present

## 2021-12-06 DIAGNOSIS — K219 Gastro-esophageal reflux disease without esophagitis: Secondary | ICD-10-CM

## 2021-12-06 DIAGNOSIS — I6502 Occlusion and stenosis of left vertebral artery: Secondary | ICD-10-CM | POA: Diagnosis not present

## 2021-12-06 DIAGNOSIS — E876 Hypokalemia: Secondary | ICD-10-CM

## 2021-12-06 DIAGNOSIS — I10 Essential (primary) hypertension: Secondary | ICD-10-CM

## 2021-12-06 DIAGNOSIS — I6389 Other cerebral infarction: Secondary | ICD-10-CM

## 2021-12-06 DIAGNOSIS — M47812 Spondylosis without myelopathy or radiculopathy, cervical region: Secondary | ICD-10-CM | POA: Diagnosis not present

## 2021-12-06 DIAGNOSIS — I639 Cerebral infarction, unspecified: Secondary | ICD-10-CM | POA: Diagnosis present

## 2021-12-06 DIAGNOSIS — I6300 Cerebral infarction due to thrombosis of unspecified precerebral artery: Secondary | ICD-10-CM

## 2021-12-06 DIAGNOSIS — E7800 Pure hypercholesterolemia, unspecified: Secondary | ICD-10-CM

## 2021-12-06 DIAGNOSIS — Z955 Presence of coronary angioplasty implant and graft: Secondary | ICD-10-CM | POA: Diagnosis not present

## 2021-12-06 DIAGNOSIS — I471 Supraventricular tachycardia: Secondary | ICD-10-CM | POA: Diagnosis not present

## 2021-12-06 DIAGNOSIS — I6523 Occlusion and stenosis of bilateral carotid arteries: Secondary | ICD-10-CM

## 2021-12-06 LAB — LIPID PANEL
Cholesterol: 102 mg/dL (ref 0–200)
HDL: 34 mg/dL — ABNORMAL LOW (ref >40)
LDL Cholesterol: 58 mg/dL (ref 0–99)
Total CHOL/HDL Ratio: 3 RATIO
Triglycerides: 52 mg/dL (ref <150)
VLDL: 10 mg/dL (ref 0–40)

## 2021-12-06 LAB — COMPREHENSIVE METABOLIC PANEL
ALT: 17 U/L (ref 0–44)
AST: 25 U/L (ref 15–41)
Albumin: 2.8 g/dL — ABNORMAL LOW (ref 3.5–5.0)
Alkaline Phosphatase: 101 U/L (ref 38–126)
Anion gap: 7 (ref 5–15)
BUN: 9 mg/dL (ref 8–23)
CO2: 29 mmol/L (ref 22–32)
Calcium: 8.4 mg/dL — ABNORMAL LOW (ref 8.9–10.3)
Chloride: 107 mmol/L (ref 98–111)
Creatinine, Ser: 0.89 mg/dL (ref 0.44–1.00)
GFR, Estimated: >60 mL/min (ref >60)
Glucose, Bld: 121 mg/dL — ABNORMAL HIGH (ref 70–99)
Potassium: 3.7 mmol/L (ref 3.5–5.1)
Sodium: 143 mmol/L (ref 135–145)
Total Bilirubin: 1.4 mg/dL — ABNORMAL HIGH (ref 0.3–1.2)
Total Protein: 5.3 g/dL — ABNORMAL LOW (ref 6.5–8.1)

## 2021-12-06 LAB — ECHOCARDIOGRAM COMPLETE
AR max vel: 1.04 cm2
AV Area VTI: 1.11 cm2
AV Area mean vel: 1.06 cm2
AV Mean grad: 22 mmHg
AV Peak grad: 40.1 mmHg
Ao pk vel: 3.17 m/s
Area-P 1/2: 2.61 cm2
Height: 63 in
P 1/2 time: 401 msec
S' Lateral: 3.5 cm
Weight: 2222.24 oz

## 2021-12-06 LAB — RESP PANEL BY RT-PCR (FLU A&B, COVID) ARPGX2
Influenza A by PCR: NEGATIVE
Influenza B by PCR: NEGATIVE
SARS Coronavirus 2 by RT PCR: NEGATIVE

## 2021-12-06 LAB — MAGNESIUM: Magnesium: 2 mg/dL (ref 1.7–2.4)

## 2021-12-06 LAB — URINALYSIS, ROUTINE W REFLEX MICROSCOPIC
Bilirubin Urine: NEGATIVE
Glucose, UA: NEGATIVE mg/dL
Hgb urine dipstick: NEGATIVE
Ketones, ur: NEGATIVE mg/dL
Nitrite: NEGATIVE
Protein, ur: NEGATIVE mg/dL
Specific Gravity, Urine: 1.025 (ref 1.005–1.030)
pH: 7 (ref 5.0–8.0)

## 2021-12-06 LAB — RAPID URINE DRUG SCREEN, HOSP PERFORMED
Amphetamines: NOT DETECTED
Barbiturates: NOT DETECTED
Benzodiazepines: NOT DETECTED
Cocaine: NOT DETECTED
Opiates: NOT DETECTED
Tetrahydrocannabinol: NOT DETECTED

## 2021-12-06 LAB — CBC
HCT: 34.8 % — ABNORMAL LOW (ref 36.0–46.0)
Hemoglobin: 11.8 g/dL — ABNORMAL LOW (ref 12.0–15.0)
MCH: 32.6 pg (ref 26.0–34.0)
MCHC: 33.9 g/dL (ref 30.0–36.0)
MCV: 96.1 fL (ref 80.0–100.0)
Platelets: 119 10*3/uL — ABNORMAL LOW (ref 150–400)
RBC: 3.62 MIL/uL — ABNORMAL LOW (ref 3.87–5.11)
RDW: 14.8 % (ref 11.5–15.5)
WBC: 6.7 10*3/uL (ref 4.0–10.5)
nRBC: 0 % (ref 0.0–0.2)

## 2021-12-06 MED ORDER — HYDROCODONE-ACETAMINOPHEN 10-325 MG PO TABS: 1.0000 | ORAL_TABLET | ORAL | Status: DC | PRN

## 2021-12-06 MED ORDER — AZELASTINE HCL 0.1 % NA SOLN
1.0000 | Freq: Two times a day (BID) | NASAL | Status: DC
  Filled 2021-12-06: qty 30

## 2021-12-06 MED ORDER — ALUM & MAG HYDROXIDE-SIMETH 200-200-20 MG/5ML PO SUSP
30.0000 mL | Freq: Once | ORAL | Status: AC
  Administered 2021-12-06: 30 mL via ORAL
  Filled 2021-12-06: qty 30

## 2021-12-06 MED ORDER — APIXABAN 5 MG PO TABS
5.0000 mg | ORAL_TABLET | Freq: Two times a day (BID) | ORAL | Status: DC
  Administered 2021-12-06: 5 mg via ORAL
  Filled 2021-12-06: qty 1

## 2021-12-06 MED ORDER — POTASSIUM CHLORIDE CRYS ER 20 MEQ PO TBCR
40.0000 meq | EXTENDED_RELEASE_TABLET | Freq: Once | ORAL | Status: AC
Start: 2021-12-06 — End: 2021-12-06
  Administered 2021-12-06: 40 meq via ORAL
  Filled 2021-12-06: qty 2

## 2021-12-06 MED ORDER — SODIUM CHLORIDE 0.9 % IV SOLN: INTRAVENOUS | Status: DC

## 2021-12-06 MED ORDER — ACETAMINOPHEN 325 MG PO TABS: 650.0000 mg | ORAL_TABLET | ORAL | Status: DC | PRN

## 2021-12-06 MED ORDER — LIDOCAINE VISCOUS HCL 2 % MT SOLN
15.0000 mL | Freq: Once | OROMUCOSAL | Status: AC
  Administered 2021-12-06: 15 mL via ORAL
  Filled 2021-12-06: qty 15

## 2021-12-06 MED ORDER — CARVEDILOL 3.125 MG PO TABS
6.2500 mg | ORAL_TABLET | Freq: Two times a day (BID) | ORAL | Status: DC
  Administered 2021-12-06 (×2): 6.25 mg via ORAL
  Filled 2021-12-06 (×2): qty 2

## 2021-12-06 MED ORDER — NITROGLYCERIN 0.4 MG SL SUBL: 0.4000 mg | SUBLINGUAL_TABLET | SUBLINGUAL | Status: DC | PRN

## 2021-12-06 MED ORDER — RAMELTEON 8 MG PO TABS: 8.0000 mg | ORAL_TABLET | Freq: Every day | ORAL | Status: DC

## 2021-12-06 MED ORDER — HEPARIN SODIUM (PORCINE) 5000 UNIT/ML IJ SOLN: 5000.0000 [IU] | Freq: Three times a day (TID) | INTRAMUSCULAR | Status: DC

## 2021-12-06 MED ORDER — DILTIAZEM HCL ER COATED BEADS 180 MG PO CP24
180.0000 mg | ORAL_CAPSULE | Freq: Every day | ORAL | Status: DC
  Administered 2021-12-06: 180 mg via ORAL
  Filled 2021-12-06: qty 1

## 2021-12-06 MED ORDER — ATORVASTATIN CALCIUM 40 MG PO TABS
80.0000 mg | ORAL_TABLET | Freq: Every day | ORAL | Status: DC
  Administered 2021-12-06: 80 mg via ORAL
  Filled 2021-12-06: qty 2

## 2021-12-06 MED ORDER — MAGNESIUM OXIDE -MG SUPPLEMENT 400 (240 MG) MG PO TABS
800.0000 mg | ORAL_TABLET | Freq: Once | ORAL | Status: AC
  Administered 2021-12-06: 800 mg via ORAL
  Filled 2021-12-06: qty 2

## 2021-12-06 MED ORDER — POTASSIUM CHLORIDE 10 MEQ/100ML IV SOLN
10.0000 meq | INTRAVENOUS | Status: AC
  Administered 2021-12-06 (×2): 10 meq via INTRAVENOUS
  Filled 2021-12-06 (×2): qty 100

## 2021-12-06 MED ORDER — FUROSEMIDE 40 MG PO TABS: 40.0000 mg | ORAL_TABLET | Freq: Every day | ORAL | Status: DC

## 2021-12-06 MED ORDER — ADULT MULTIVITAMIN W/MINERALS CH
1.0000 | ORAL_TABLET | Freq: Every day | ORAL | Status: DC
Start: 2021-12-06 — End: 2021-12-06
  Administered 2021-12-06: 1 via ORAL
  Filled 2021-12-06: qty 1

## 2021-12-06 MED ORDER — COENZYME Q10 100 MG PO CAPS: 100.0000 mg | ORAL_CAPSULE | Freq: Every day | ORAL | Status: DC

## 2021-12-06 MED ORDER — ONDANSETRON HCL 4 MG/2ML IJ SOLN
4.0000 mg | Freq: Four times a day (QID) | INTRAMUSCULAR | Status: DC | PRN
  Administered 2021-12-06: 4 mg via INTRAVENOUS
  Filled 2021-12-06: qty 2

## 2021-12-06 MED ORDER — POTASSIUM CHLORIDE 10 MEQ/100ML IV SOLN
10.0000 meq | Freq: Once | INTRAVENOUS | Status: AC
  Administered 2021-12-06: 10 meq via INTRAVENOUS
  Filled 2021-12-06: qty 100

## 2021-12-06 NOTE — Evaluation (Signed)
Physical Therapy Evaluation ?Patient Details ?Name: [name] ?MRN: [id number] ?DOB: [birth year] ?Today's Date: 12/06/2021 ? ?History of Present Illness ? [name] is a 81 y.o. female with PMH A-fib, bilateral carotid stenosis, multiple previous CVAs, CAD status post stenting, esophageal varices who presents emergency department for evaluation of dizziness and extremity weakness.  Patient arrives as a code stroke for left upper and left lower extremity weakness with last known well 45 minutes prior to arrival.  EMS states that patient had an episode of acute onset dizziness with left upper and left lower extremity weakness that has since resolved upon arrival to the emergency department.  Here in the emergency department she denies dizziness, chest pain, shortness of breath, abdominal pain of nausea, vomiting or other systemic symptoms.  Denies numbness, tingling, weakness here in the ER. ?  ?Clinical Impression ? Patient limited for functional mobility as stated below secondary to BLE weakness with L>R deficit, fatigue and poor standing balance. Patient with slow labored bed mobility to transition to seated EOB. Patient with L lateral lean in seated despite feeling fully upright. Patient demonstrates good sitting tolerance EOB. Patient requires mod assist to transfer to standing with use of RW due to weakness and poor LLE coordination. She demonstrates very limited standing tolerance and requires rest breaks between transfers. Patient typically able to transfer/ambulate very short distances independently with support from AD/walls. Patient will benefit from continued physical therapy in hospital and recommended venue below to increase strength, balance, endurance for safe ADLs and gait. ?   ?   ? ?Recommendations for follow up therapy are one component of a multi-disciplinary discharge planning process, led by the attending physician.  Recommendations may be updated based on patient status, additional  functional criteria and insurance authorization. ? ?Follow Up Recommendations Acute inpatient rehab (3hours/day) ? ?  ?Assistance Recommended at Discharge Intermittent Supervision/Assistance  ?Patient can return home with the following ? A lot of help with walking and/or transfers;A lot of help with bathing/dressing/bathroom;Assistance with cooking/housework;Assist for transportation;Help with stairs or ramp for entrance ? ?  ?Equipment Recommendations None recommended by PT  ?Recommendations for Other Services ?    ?  ?Functional Status Assessment Patient has had a recent decline in their functional status and demonstrates the ability to make significant improvements in function in a reasonable and predictable amount of time.  ? ?  ?Precautions / Restrictions Precautions ?Precautions: Fall ?Restrictions ?Weight Bearing Restrictions: No  ? ?  ? ?Mobility ? Bed Mobility ?Overal bed mobility: Needs Assistance ?Bed Mobility: Supine to Sit, Sit to Supine ?  ?  ?Supine to sit: Min guard, Min assist, HOB elevated ?Sit to supine: Min guard, Min assist ?  ?General bed mobility comments: labored, requires increased time ?  ? ?Transfers ?Overall transfer level: Needs assistance ?Equipment used: Rolling walker (2 wheels) ?Transfers: Sit to/from Stand ?Sit to Stand: Mod assist ?  ?  ?  ?  ?  ?General transfer comment: completed STS 5x with rest breaks, relies RLE, assist for weakness to transfer and remain standing ?  ? ?Ambulation/Gait ?  ?  ?  ?  ?  ?  ?  ?  ? ?Stairs ?  ?  ?  ?  ?  ? ?Wheelchair Mobility ?  ? ?Modified Rankin (Stroke Patients Only) ?  ? ?  ? ?Balance Overall balance assessment: Needs assistance ?Sitting-balance support: Bilateral upper extremity supported, Feet supported ?Sitting balance-Leahy Scale: Fair ?Sitting balance - Comments: seated EOB ?Postural control: Left  lateral lean ?Standing balance support: Bilateral upper extremity supported ?Standing balance-Leahy Scale: Poor ?  ?  ?  ?  ?  ?  ?  ?  ?  ?   ?  ?  ?   ? ? ? ?Pertinent Vitals/Pain Pain Assessment ?Pain Assessment: No/denies pain  ? ? ?Home Living Family/patient expects to be discharged to:: Private residence ?Living Arrangements: Other relatives ?Available Help at Discharge: Family ?Type of Home: House ?Home Access: Stairs to enter ?  ?Entrance Stairs-Number of Steps: 3 steps from car to yard, 2 steps from yard to porch ?  ?Home Layout: One level ?Home Equipment: Conservation officer, nature (2 wheels);Wheelchair - manual;BSC/3in1;Shower seat;Cane - single point ?   ?  ?Prior Function Prior Level of Function : Independent/Modified Independent ?  ?  ?  ?  ?  ?  ?Mobility Comments: Patient states short distance household ambulation with RW, mostly ambulates several steps from bed, toilet, etc. to wheelchair due to fear of falling ?ADLs Comments: independent with basic ADL, family assist PRN ?  ? ? ?Hand Dominance  ?   ? ?  ?Extremity/Trunk Assessment  ? Upper Extremity Assessment ?Upper Extremity Assessment: Defer to OT evaluation ?  ? ?Lower Extremity Assessment ?Lower Extremity Assessment: Generalized weakness;LLE deficits/detail ?LLE Deficits / Details: Quads 3/5, hamstrings 3/5, ankle DF 2-/5 ?LLE Sensation: WNL ?LLE Coordination: decreased gross motor ?  ? ?Cervical / Trunk Assessment ?Cervical / Trunk Assessment: Normal  ?Communication  ? Communication: No difficulties  ?Cognition Arousal/Alertness: Awake/alert ?Behavior During Therapy: [facility] for tasks assessed/performed ?Overall Cognitive Status: Within Functional Limits for tasks assessed ?  ?  ?  ?  ?  ?  ?  ?  ?  ?  ?  ?  ?  ?  ?  ?  ?  ?  ?  ? ?  ?General Comments   ? ?  ?Exercises    ? ?Assessment/Plan  ?  ?PT Assessment Patient needs continued PT services  ?PT Problem List Decreased strength;Decreased mobility;Decreased activity tolerance;Decreased balance ? ?   ?  ?PT Treatment Interventions DME instruction;Therapeutic exercise;Gait training;Balance training;Stair training;Neuromuscular  re-education;Functional mobility training;Therapeutic activities;Patient/family education   ? ?PT Goals (Current goals can be found in the Care Plan section)  ?Acute Rehab PT Goals ?Patient Stated Goal: Improve mobility ?PT Goal Formulation: With patient ?Time For Goal Achievement: 12/20/21 ?Potential to Achieve Goals: Good ? ?  ?Frequency Min 4X/week ?  ? ? ?Co-evaluation   ?  ?  ?  ?  ? ? ?  ?AM-PAC PT "6 Clicks" Mobility  ?Outcome Measure Help needed turning from your back to your side while in a flat bed without using bedrails?: None ?Help needed moving from lying on your back to sitting on the side of a flat bed without using bedrails?: A Little ?Help needed moving to and from a bed to a chair (including a wheelchair)?: A Lot ?Help needed standing up from a chair using your arms (e.g., wheelchair or bedside chair)?: A Lot ?Help needed to walk in hospital room?: A Lot ?Help needed climbing 3-5 steps with a railing? : Total ?6 Click Score: 14 ? ?  ?End of Session Equipment Utilized During Treatment: Gait belt ?Activity Tolerance: Patient tolerated treatment well ?Patient left: in bed;with call bell/phone within reach ?Nurse Communication: Mobility status ?PT Visit Diagnosis: Unsteadiness on feet (R26.81);Other abnormalities of gait and mobility (R26.89);Muscle weakness (generalized) (M62.81);Other symptoms and signs involving the nervous system (R29.898) ?  ? ?Time: 1610-9604 ?PT  Time Calculation (min) (ACUTE ONLY): 29 min ? ? ?Charges:   PT Evaluation ?$PT Eval Moderate Complexity: 1 Mod ?PT Treatments ?$Therapeutic Activity: 8-22 mins ?  ?   ? ? ?11:44 AM, 12/06/21 ?Mearl Latin PT, DPT ?Physical Therapist at [facility] ?[facility] ? ? ?

## 2021-12-06 NOTE — Assessment & Plan Note (Addendum)
Ruling out acute CVA versus TIA ?Symptoms: Dizziness, left upper and lower extremity weakness, improved ?Previous history of strokes ?-Continue home medication of Eliquis, high-dose statins, ?- Likely subacute CVA ?-CTA, CT angiogram reviewed ?-Pursuing MRI : IMPRESSION: ?1. Punctate focus of DWI hyperintensity in the inferior left cerebellum does not have an ADC correlate and could represent artifact or subacute infarct. ?2. Remote infratentorial and supratentorial infarcts and chronic microvascular disease. Inferior left cerebellar infarcts are new since 2021 MRI. ?3. Diminished flow void in the left vertebral artery, compatible with findings on same day CTA.  ? ?-Neurology consulted -which she could not logon to see the patient formally, But her chart was reviewed, neurologist recommended continue current medication and follow-up with a neurologist as an outpatient ? ?-Status post PT/OT evaluation, recommended CIR-patient and her son refused CIR or SNF as patient had an bad experience in the past with her previous strokes ? ?Accepted home health to be assigned, patient son and his 2 daughters are willing to take care of the patient at home...  ?

## 2021-12-06 NOTE — Discharge Summary (Signed)
?Physician Discharge Summary ?  ?Patient: [name] MRN: [id number] DOB: 04-02-1941  ?Admit date:     12/05/2021  ?Discharge date: 12/06/21  ?Discharge Physician: Valeria Batman Isidor Bromell  ? ?PCP: Barbara School, MD  ? ?Recommendations at discharge:  ? ?Continue current medication high-dose statins and Eliquis ?Continue home health with PT OT ?Follow-up with neurologist within 1 week ?Follow-up with PCP in 1-2 weeks ? ?Discharge Diagnoses: ?Principal Problem: ?  CVA (cerebral vascular accident) ([place]) ?Active Problems: ?  CAD (coronary artery disease) ?  Hypercholesteremia ?  GERD (gastroesophageal reflux disease) ?  Presence of stent in right coronary artery ?  Aortic insufficiency ?  HTN (hypertension) ?  Carotid stenosis, bilateral ?  Hepatic cirrhosis (HCC) ?  PSVT (paroxysmal supraventricular tachycardia) ([place]) ?  Cerebral infarction ([place]) right occipital due to small vessel disease s/p tPA ?  Acute coronary syndrome (HCC) ?  Thrombocytopenia ([place]) ?  Hypokalemia ?  Seizure ([place]) ?  History of esophageal varices ? ?Resolved Problems: ?  * No resolved hospital problems. * ? ?Hospital Course: ?[name] IS A 81 y.o F with PMHx of A-fib on Eliquis, bilateral carotid stenosis, coronary artery disease with stent, previous CVAs, NASH, with esophageal varices, HTN, HLD, GERD, aortic sclerosis, OSA, ?Presenting with acute onset of dizziness, and extremity weaknesses.  Patient was evaluated EEG possible dizziness, left upper and lower extremity weakness which lasted 45 minutes to arrival, patient was asymptomatic in ED.  Per EMS patient had an episode of acute dizziness and left upper and lower extremity weakness that has since resolved. ? ?Patient currently stable denies any dizziness or upper or lower extremity weakness, denies of having any numbness,.  Denies of having any shortness of breath, chest pain, abdominal pain, nausea, vomiting, diarrhea, or constipation. ? ?ED: ?Blood pressure (!) 129/52, pulse 75,  temperature 98.2 ?F (36.8 ?C), temperature source Oral, resp. rate 18, height 5' 3"  (1.6 m), weight 63 kg, SpO2 96 %. ? ?Labs potassium 2.7, calcium 8.4, INR 1.8, glucose at 73, magnesium 2 ?UA within normal limit, drug screen negative ?Fluids A/B, SARS-CoV-2 all negative ? ?CT angio head and neck: ?IMPRESSION: ?1. Increasing attenuation with near occlusion of the intracranial left V4 segment, new as compared to prior CTA from 01/03/2020. This could be acute in nature given the probable evolving left cerebellar ischemic changes on prior noncontrast head CT. ?2. No other large vessel occlusion or other emergent finding. ?3. Short-segment 70% stenosis at the left carotid bulb/proximal cervical left ICA, similar to previous. ?4. Focal moderate right P2 stenosis, also similar. ?5. Right larger than left layering pleural effusions, partially visualized. ? ?CT head  IMPRESSION: ?1. Focal hypodensity involving the left cerebellum, consistent with an age-indeterminate infarct, possibly acute to subacute in nature. No acute intracranial hemorrhage. ?2. ASPECTS is 10. ?3. Chronic right MCA distribution infarct, stable. ?4. Underlying atrophy with chronic small vessel ischemic disease and multiple remote lacunar infarcts about the basal ganglia. ? ?Assessment and Plan: ?* CVA (cerebral vascular accident) ([place]) ?Ruling out acute CVA versus TIA ?Symptoms: Dizziness, left upper and lower extremity weakness, improved ?Previous history of strokes ?-Continue home medication of Eliquis, high-dose statins, ?- Likely subacute CVA ?-CTA, CT angiogram reviewed ?-Pursuing MRI : IMPRESSION: ?1. Punctate focus of DWI hyperintensity in the inferior left cerebellum does not have an ADC correlate and could represent artifact or subacute infarct. ?2. Remote infratentorial and supratentorial infarcts and chronic microvascular disease. Inferior left cerebellar infarcts are new since 2021 MRI. ?3. Diminished  flow void in the left vertebral artery,  compatible with findings on same day CTA.  ? ?-Neurology consulted -which she could not logon to see the patient formally, But her chart was reviewed, neurologist recommended continue current medication and follow-up with a neurologist as an outpatient ? ?-Status post PT/OT evaluation, recommended CIR-patient and her son refused CIR or SNF as patient had an bad experience in the past with her previous strokes ? ?Accepted home health to be assigned, patient son and his 2 daughters are willing to take care of the patient at home...  ? ?Seizure ([place]) ?Currently stable not on any antiseizure medications at home, ?Continue home psych medication of her [place]  ? ?Hypokalemia ?- Repleting IV and p.o. ?Monitoring closely ? ?Thrombocytopenia ([place]) ?Stable, monitoring ? ?Acute coronary syndrome (HCC) ?- Stable continue current meds Eliquis, statins, beta-blockers ? ?Cerebral infarction ([place]) right occipital due to small vessel disease s/p tPA ?Previous history ?Currently on high-dose statins, Eliquis ? ?PSVT (paroxysmal supraventricular tachycardia) ([place]) ?On Eliquis ?-Rhythm control on beta-blocker ? ?Hepatic cirrhosis ([place]) ?Monitoring closely, on high-dose statins, ?LFTs within normal limits ? ?Carotid stenosis, bilateral ?Continue Eliquis and Lipitor full dose ?CTA reviewed:  ? ?HTN (hypertension) ?- Reviewing and resuming home medication accordingly ?-Holding for now--for evaluation of ruling out acute CVA, pursuing permissive hypertension ? ?Aortic insufficiency ?- Monitoring closely, stable ? ?Presence of stent in right coronary artery ?Currently on Eliquis and Lipitor ? ?GERD (gastroesophageal reflux disease) ?Continue PPI ? ?Hypercholesteremia ?- Continue high-dose statin ? ?CAD (coronary artery disease) ?We will continue current management ?-Continue home medication of Eliquis, Lipitor, Coreg ? ? ? ? ?Consultants: Teleneurology ?Procedures performed: Echo CT head, MRI of the brain ?Disposition: Home health ?Diet  recommendation:  ?Discharge Diet Orders (From admission, onward)  ? ?  Start     Ordered  ? 12/06/21 0000  Diet - low sodium heart healthy       ? 12/06/21 1630  ? ?  ?  ? ?  ? ?Cardiac diet ?DISCHARGE MEDICATION: ?Allergies as of 12/06/2021   ? ?   Reactions  ? Tape Rash, Other (See Comments)  ? Please use paper tape  ? ?  ? ?  ?Medication List  ?  ? ?STOP taking these medications   ? ?methocarbamol 500 MG tablet ?Commonly known as: ROBAXIN ?  ?mirtazapine 45 MG tablet ?Commonly known as: REMERON ?  ? ?  ? ?TAKE these medications   ? ?acetaminophen 500 MG tablet ?Commonly known as: TYLENOL ?Take 1,000 mg by mouth every 6 (six) hours as needed for moderate pain. ?  ?atorvastatin 80 MG tablet ?Commonly known as: LIPITOR ?Take 1 tablet (80 mg total) by mouth daily. ?  ?azelastine 0.1 % nasal spray ?Commonly known as: ASTELIN ?Place 1 spray into both nostrils 2 (two) times daily. ?  ?calcium citrate 950 (200 Ca) MG tablet ?Commonly known as: CALCITRATE - dosed in mg elemental calcium ?Take 1 tablet (200 mg of elemental calcium total) by mouth daily. ?What changed: how much to take ?  ?carvedilol 6.25 MG tablet ?Commonly known as: COREG ?TAKE (1) TABLET BY MOUTH TWICE DAILY WITH A MEAL. ?  ?Coenzyme Q10 100 MG capsule ?Take 100 mg by mouth daily. ?  ?diltiazem 180 MG 24 hr capsule ?Commonly known as: CARDIZEM CD ?Take 1 capsule (180 mg total) by mouth daily. ?  ?Eliquis 5 MG Tabs tablet ?Generic drug: apixaban ?TAKE (1) TABLET BY MOUTH TWICE DAILY. ?  ?furosemide 20 MG tablet ?Commonly known  as: LASIX ?Take 20 mg by mouth daily. ?  ?furosemide 40 MG tablet ?Commonly known as: LASIX ?TAKE (1) TABLET BY MOUTH ONCE DAILY. MAY TAKE ADDITIONAL HALF IF NEEDED FOR SEVERE SWELLING. ?  ?MULTIVITAMIN GUMMIES ADULT PO ?Take 2 tablets by mouth daily. ?  ?nitroGLYCERIN 0.4 MG SL tablet ?Commonly known as: NITROSTAT ?Place under the tongue. ?  ?omeprazole 20 MG capsule ?Commonly known as: PRILOSEC ?TAKE (2) CAPSULES BY MOUTH DAILY  BEFORE A MEAL FOR 15 DAYS. ?  ?ondansetron 4 MG tablet ?Commonly known as: ZOFRAN ?Take 4 mg by mouth every 8 (eight) hours as needed for nausea or vomiting. ?  ?polyethylene glycol powder 17 GM/SCOOP powder

## 2021-12-06 NOTE — TOC Transition Note (Signed)
Transition of Care (TOC) - CM/SW Discharge Note ? ? ?Patient Details  ?Name: [name] ?MRN: [id number] ?Date of Birth: [birth year] ? ?Transition of Care (TOC) CM/SW Contact:  ?Shade Flood, LCSW ?Phone Number: ?12/06/2021, 4:09 PM ? ? ?Clinical Narrative:    ? ?Pt admitted from home. PT/OT recommending CIR. MD spoke with pt and family and they do not want CIR or SNF. Pt has grandchildren that live with her and pt's son is retired and lives across the street. TOC spoke with pt's son and provided detailed information from PT/OT notes. Son states that family will take care of her and he requests [facility] referral. CMS provider options reviewed. Accepted by [facility]. Will add their info to the AVS. ? ?Anticipating dc home today or tomorrow. No other TOC needs identified at this time. ? ?Expected Discharge Plan: [place] ?Barriers to Discharge: Barriers Resolved ? ? ?Patient Goals and CMS Choice ?Patient states their goals for this hospitalization and ongoing recovery are:: go home ?CMS Medicare.gov Compare Post Acute Care list provided to:: Patient Represenative (must comment) ?Choice offered to / list presented to : Adult Children ? ?Expected Discharge Plan and Services ?Expected Discharge Plan: [place] ?In-house Referral: Clinical Social Work ?  ?Post Acute Care Choice: Home Health ?Living arrangements for the past 2 months: [place] ?                ?  ?  ?  ?  ?  ?HH Arranged: PT, OT ?[place] Agency: [place] ?Date HH Agency Contacted: 12/06/21 ?  ?Representative spoke with at [place]: Tommi Rumps ? ?Prior Living Arrangements/Services ?Living arrangements for the past 2 months: [place] ?Lives with:: Adult Children ?Patient language and need for interpreter reviewed:: Yes ?Do you feel safe going back to the place where you live?: Yes      ?Need for Family Participation in Patient Care: Yes (Comment) ?Care giver support system in place?: Yes  (comment) ?Current home services: DME ?Criminal Activity/Legal Involvement Pertinent to Current Situation/Hospitalization: No - Comment as needed ? ?Activities of Daily Living ?Home Assistive Devices/Equipment: Wheelchair, Environmental consultant (specify type), Raised toilet seat with rails ?ADL Screening (condition at time of admission) ?Patient's cognitive ability adequate to safely complete daily activities?: Yes ?Is the patient deaf or have difficulty hearing?: No ?Does the patient have difficulty seeing, even when wearing glasses/contacts?: No ?Does the patient have difficulty concentrating, remembering, or making decisions?: No ?Patient able to express need for assistance with ADLs?: Yes ?Does the patient have difficulty dressing or bathing?: No ?Independently performs ADLs?: Yes (appropriate for developmental age) ?Does the patient have difficulty walking or climbing stairs?: No ?Weakness of Legs: None ?Weakness of Arms/Hands: None ? ?Permission Sought/Granted ?Permission sought to share information with : Customer service manager ?Permission granted to share information with : Yes, Verbal Permission Granted ?   ? Permission granted to share info w AGENCY: HH ?   ?   ? ?Emotional Assessment ?  ?  ?  ?Orientation: : Oriented to Self, Oriented to Place, Oriented to  Time, Oriented to Situation ?Alcohol / Substance Use: Not Applicable ?Psych Involvement: No (comment) ? ?Admission diagnosis:  CVA (cerebral vascular accident) ([place]) [I63.9] ?Patient Active Problem List  ? Diagnosis Date Noted  ? CVA (cerebral vascular accident) ([place]) 12/06/2021  ? NASH (nonalcoholic steatohepatitis) 06/05/2020  ? History of esophageal varices 06/05/2020  ? Persistent atrial fibrillation ([place]) 03/09/2020  ? Paroxysmal atrial fibrillation (  [place]) 02/24/2020  ? Secondary hypercoagulable state ([place]) 02/24/2020  ? Acute CVA (cerebrovascular accident) ([place]) 01/03/2020  ? Tobacco use disorder 07/09/2018  ? Small vessel disease ([place]) 07/09/2018  ?  Benign essential HTN   ? Seizures ([place])   ? Dysphagia   ? Dysarthria 05/19/2018  ? Syncope and collapse   ? Closed left femoral fracture ([place]) 04/21/2017  ? Seizure disorder ([place]) 04/21/2017  ? Closed pertrochanteric fracture of femur, left, initial encounter ([place]) 03/02/2017  ? Seizure ([place]) 02/28/2017  ? Hip fracture ([place]) 02/28/2017  ? Diabetes mellitus without complication ([place]) 45/62/5638  ? Epiretinal membrane (ERM) of left eye 01/20/2017  ? Pseudophakia 01/20/2017  ? NSTEMI (non-ST elevated myocardial infarction) ([place]) 11/02/2016  ? Elevated troponin 09/24/2016  ? Hypokalemia 09/23/2016  ? TIA (transient ischemic attack) 05/05/2016  ? Intractable nausea and vomiting 10/15/2015  ? Acute coronary syndrome ([place]) 06/01/2015  ? Thrombocytopenia ([place]) 06/01/2015  ? Cerebral infarction ([place]) right occipital due to small vessel disease s/p tPA 03/06/2014  ? PSVT (paroxysmal supraventricular tachycardia) ([place]) 12/09/2013  ? Esophageal varices ([place]) 03/29/2013  ? Hematemesis 03/29/2013  ? Hepatic cirrhosis ([place]) 03/29/2013  ? Presence of stent in right coronary artery 07/04/2011  ? OSA (obstructive sleep apnea) 07/04/2011  ? Aortic sclerosis 07/04/2011  ? Aortic insufficiency 07/04/2011  ? HTN (hypertension) 07/04/2011  ? Carotid stenosis, bilateral 07/04/2011  ? CAD (coronary artery disease) 07/03/2011  ? Hypercholesteremia 07/03/2011  ? GERD (gastroesophageal reflux disease) 07/03/2011  ? ?PCP:  Redmond School, MD ?Pharmacy:   ?[place] [address] ?[place] [address] ?Phone: [phone] Fax: [phone] ? ? ? ? ?Social Determinants of Health (SDOH) Interventions ?  ? ?Readmission Risk Interventions ?   ? View : No data to display.  ?  ?  ?  ? ? ? ?Final next level of care: [place] ?Barriers to Discharge: Barriers Resolved ? ? ?Patient Goals and CMS Choice ?Patient states their goals for this hospitalization and ongoing recovery  are:: go home ?CMS Medicare.gov Compare Post Acute Care list provided to:: Patient Represenative (must comment) ?Choice offered to / list presented to : Adult Children ? ?Discharge Placement ?  ?           ?  ?  ?  ?  ? ?Discharge Plan and Services ?In-house Referral: Clinical Social Work ?  ?Post Acute Care Choice: Home Health          ?  ?  ?  ?  ?  ?HH Arranged: PT, OT ?[place] Agency: [place] ?Date HH Agency Contacted: 12/06/21 ?  ?Representative spoke with at [place]: Tommi Rumps ? ?Social Determinants of Health (SDOH) Interventions ?  ? ? ?Readmission Risk Interventions ?   ? View : No data to display.  ?  ?  ?  ? ? ? ? ? ?

## 2021-12-06 NOTE — Assessment & Plan Note (Signed)
Continue PPI ?

## 2021-12-06 NOTE — Assessment & Plan Note (Signed)
Stable, monitoring ?

## 2021-12-06 NOTE — Plan of Care (Signed)

## 2021-12-06 NOTE — H&P (Signed)
? ?History and Physical  ? ?Patient: [name]                            PCP: Redmond School, MD                    ?DOB: 31-Aug-1941            DOA: 12/05/2021 ?[id number]             DOS: 12/06/2021, 11:41 AM ? ?Redmond School, MD ? ?Patient coming from:   HOME  ?I have personally reviewed patient's medical records, in electronic medical records, including:  ?[facility], and care everywhere.  ? ? ?Chief Complaint:  ? ?Chief Complaint  ?Patient presents with  ? Code Stroke  ? ? ?History of present illness:  ? ? ?Barbara Camps Agostinelli IS A 81 y.o F with PMHx of A-fib on Eliquis, bilateral carotid stenosis, coronary artery disease with stent, previous CVAs, NASH, with esophageal varices, HTN, HLD, GERD, aortic sclerosis, OSA, ?Presenting with acute onset of dizziness, and extremity weaknesses.  Patient was evaluated EEG possible dizziness, left upper and lower extremity weakness which lasted 45 minutes to arrival, patient was asymptomatic in ED.  Per EMS patient had an episode of acute dizziness and left upper and lower extremity weakness that has since resolved. ? ?Patient currently stable denies any dizziness or upper or lower extremity weakness, denies of having any numbness,.  Denies of having any shortness of breath, chest pain, abdominal pain, nausea, vomiting, diarrhea, or constipation. ? ?ED: ?Blood pressure (!) 129/52, pulse 75, temperature 98.2 ?F (36.8 ?C), temperature source Oral, resp. rate 18, height 5' 3"  (1.6 m), weight 63 kg, SpO2 96 %. ? ?Labs potassium 2.7, calcium 8.4, INR 1.8, glucose at 73, magnesium 2 ?UA within normal limit, drug screen negative ?Fluids A/B, SARS-CoV-2 all negative ? ?CT angio head and neck: ?IMPRESSION: ?1. Increasing attenuation with near occlusion of the intracranial left V4 segment, new as compared to prior CTA from 01/03/2020. This could be acute in nature given the probable evolving left cerebellar ischemic changes on prior noncontrast head CT. ?2. No other large  vessel occlusion or other emergent finding. ?3. Short-segment 70% stenosis at the left carotid bulb/proximal cervical left ICA, similar to previous. ?4. Focal moderate right P2 stenosis, also similar. ?5. Right larger than left layering pleural effusions, partially visualized. ? ?CT head  IMPRESSION: ?1. Focal hypodensity involving the left cerebellum, consistent with an age-indeterminate infarct, possibly acute to subacute in nature. No acute intracranial hemorrhage. ?2. ASPECTS is 10. ?3. Chronic right MCA distribution infarct, stable. ?4. Underlying atrophy with chronic small vessel ischemic disease and multiple remote lacunar infarcts about the basal ganglia. ? ? ? Patient Denies having: Fever, Chills, Cough, SOB, Chest Pain, Abd pain, N/V/D, headache, dizziness, lightheadedness,  Dysuria, Joint pain, rash, open wounds ?; ? ? ?Review of Systems: As per HPI, otherwise 10 point review of systems were negative.  ? ?---------------------------------------------------------------------------------------------------------------------- ? ?Allergies  ?Allergen Reactions  ? Tape Rash and Other (See Comments)  ?  Please use paper tape  ? ? ?Home MEDs:  ?Prior to Admission medications   ?Medication Sig Start Date End Date Taking? Authorizing Provider  ?apixaban (ELIQUIS) 5 MG TABS tablet TAKE (1) TABLET BY MOUTH TWICE DAILY. 11/14/21  Yes Branch, Alphonse Guild, MD  ?atorvastatin (LIPITOR) 80 MG tablet Take 1 tablet (80 mg total) by mouth daily. 07/15/18  Yes [place],  Lavon Paganini, PA-C  ?azelastine (ASTELIN) 0.1 % nasal spray Place 1 spray into both nostrils 2 (two) times daily. 11/06/20  Yes [provider]  ?calcium citrate (CALCITRATE - DOSED IN MG ELEMENTAL CALCIUM) 950 MG tablet Take 1 tablet (200 mg of elemental calcium total) by mouth daily. ?Patient taking differently: Take 0.5 tablets by mouth daily. 07/15/18  Yes Angiulli, Lavon Paganini, PA-C  ?carvedilol (COREG) 6.25 MG tablet TAKE (1) TABLET BY MOUTH TWICE DAILY WITH A  MEAL. 11/12/21  Yes Montez Morita, Quillian Quince, MD  ?Coenzyme Q10 100 MG capsule Take 100 mg by mouth daily.   Yes [provider]  ?diltiazem (CARDIZEM CD) 180 MG 24 hr capsule Take 1 capsule (180 mg total) by mouth daily. 10/18/21  Yes BranchAlphonse Guild, MD  ?furosemide (LASIX) 20 MG tablet Take 20 mg by mouth daily.   Yes [provider]  ?furosemide (LASIX) 40 MG tablet TAKE (1) TABLET BY MOUTH ONCE DAILY. MAY TAKE ADDITIONAL HALF IF NEEDED FOR SEVERE SWELLING. 10/02/21  Yes Branch, Alphonse Guild, MD  ?methocarbamol (ROBAXIN) 500 MG tablet Take 500 mg by mouth 3 (three) times daily as needed for muscle spasms. 06/27/20  Yes [provider]  ?Multiple Vitamins-Minerals (MULTIVITAMIN GUMMIES ADULT PO) Take 2 tablets by mouth daily.   Yes [provider]  ?nitroGLYCERIN (NITROSTAT) 0.4 MG SL tablet Place under the tongue. 08/22/20  Yes [provider]  ?omeprazole (PRILOSEC) 20 MG capsule TAKE (2) CAPSULES BY MOUTH DAILY BEFORE A MEAL FOR 15 DAYS. 09/23/21  Yes Harvel Quale, MD  ?ondansetron (ZOFRAN) 4 MG tablet Take 4 mg by mouth every 8 (eight) hours as needed for nausea or vomiting.   Yes [provider]  ?polyethylene glycol powder (GLYCOLAX/MIRALAX) 17 GM/SCOOP powder Take 8.5 g by mouth daily. 09/18/20  Yes Rehman, Mechele Dawley, MD  ?Propylene Glycol (SYSTANE COMPLETE OP) Place 1 drop into both eyes in the morning and at bedtime.   Yes [provider]  ?ramelteon (ROZEREM) 8 MG tablet Take 8 mg by mouth daily. 10/04/21  Yes [provider]  ?acetaminophen (TYLENOL) 500 MG tablet Take 1,000 mg by mouth every 6 (six) hours as needed for moderate pain.    [provider]  ?mirtazapine (REMERON) 45 MG tablet Take 45 mg by mouth at bedtime. ?Patient not taking: Reported on 10/18/2021 01/22/21   [provider]  ? ? ?PRN MEDs: acetaminophen, HYDROcodone-acetaminophen, nitroGLYCERIN, ondansetron (ZOFRAN) IV ? ?Past Medical  History:  ?Diagnosis Date  ? Anemia   ? Aortic insufficiency   ? Moderate  ? Atrial fibrillation ([place])   ? Back pain   ? Carotid stenosis, bilateral   ? Cirrhosis ([place])   ? Closed left femoral fracture ([place]) 04/21/2017  ? Coronary artery disease   ? Stent x 2 RCA 1995, cardiac catheterization 09/2016 showing only mild atherosclerosis  ? DDD (degenerative disc disease), lumbar   ? Diabetes mellitus without complication ([place])   ? Essential hypertension   ? Falls   ? GERD (gastroesophageal reflux disease)   ? Hiatal hernia   ? History of stroke   ? Hypercholesteremia   ? IBS (irritable bowel syndrome)   ? Non-alcoholic fatty liver disease   ? OSA (obstructive sleep apnea)   ? no CPAP  ? Pericardial effusion   ? a. small by echo 2018.  ? PSVT (paroxysmal supraventricular tachycardia) ([place])   ? Recurrent UTI   ? Seizures ([place]) 2018  ? Stroke [facility]) 07/2019  ? several  strokes within the last 10 years  ? Swallowing difficulty   ? Varices, esophageal ([place])   ? ? ?Past Surgical History:  ?Procedure Laterality Date  ? [place]  ? APPENDECTOMY    ? [place]  ? CARDIAC CATHETERIZATION  [phone]  ? Stent to the proximal RCA after MI   ? CARDIAC CATHETERIZATION N/A 09/26/2016  ? Procedure: Left Heart Cath and Coronary Angiography;  Surgeon: Leonie Man, MD;  Location: [facility];  Service: Cardiovascular;  Laterality: N/A;  ? CATARACT EXTRACTION W/PHACO Right 07/04/2014  ? Procedure: CATARACT EXTRACTION PHACO AND INTRAOCULAR LENS PLACEMENT (IOC);  Surgeon: Elta Guadeloupe T. Gershon Crane, MD;  Location: [facility];  Service: Ophthalmology;  Laterality: Right;  CDE:13.13  ? COLONOSCOPY    ? DILATION AND CURETTAGE OF UTERUS    ? x2  ? Epi Retinal Membrane Peel Left   ? ERCP    ? ESOPHAGEAL BANDING N/A 04/01/2013  ? Procedure: ESOPHAGEAL BANDING;  Surgeon: Rogene Houston, MD;  [facility];  Service: Endoscopy;  Laterality: N/A;  ? ESOPHAGEAL BANDING N/A 05/24/2013  ? Procedure: ESOPHAGEAL BANDING;   Surgeon: Rogene Houston, MD;  [facility];  Service: Endoscopy;  Laterality: N/A;  ? ESOPHAGEAL BANDING N/A 06/21/2014  ? Procedure: ESOPHAGEAL BANDING;  Surgeon: Rogene Houston, MD;  Shelly Coss

## 2021-12-06 NOTE — Assessment & Plan Note (Signed)
-   Monitoring closely, stable ?

## 2021-12-06 NOTE — Assessment & Plan Note (Signed)
-   Stable continue current meds Eliquis, statins, beta-blockers ?

## 2021-12-06 NOTE — Progress Notes (Signed)
Notified Dr.Blech ([facility]) of CTA result.  ?

## 2021-12-06 NOTE — Plan of Care (Addendum)
?  Problem: Acute Rehab OT Goals (only OT should resolve) ?Goal: Pt. Will Perform Lower Body Bathing ?Flowsheets (Taken 12/06/2021 1230) ?Pt Will Perform Lower Body Bathing: ? with min guard assist ? sitting/lateral leans ? sit to/from stand ?Goal: Pt. Will Perform Lower Body Dressing ?Flowsheets (Taken 12/06/2021 1230) ?Pt Will Perform Lower Body Dressing: ? with min guard assist ? sitting/lateral leans ? sit to/from stand ?Goal: Pt. Will Transfer To Toilet ?Flowsheets (Taken 12/06/2021 1230) ?Pt Will Transfer to Toilet: ? with min guard assist ? stand pivot transfer ?Goal: Pt. Will Perform Toileting-Clothing Manipulation ?Flowsheets (Taken 12/06/2021 1230) ?Pt Will Perform Toileting - Clothing Manipulation and hygiene: ? with min guard assist ? sitting/lateral leans ? sit to/from stand ?Goal: OT Additional ADL Goal #1 ?Flowsheets (Taken 12/06/2021 1230) ?Additional ADL Goal #1: Pt will maintain midline sitting balance EOB for 5 mins as a precursor for seated ADL tasks. ?  ?

## 2021-12-06 NOTE — Assessment & Plan Note (Signed)
Monitoring closely, on high-dose statins, ?LFTs within normal limits ?

## 2021-12-06 NOTE — Assessment & Plan Note (Signed)
On Eliquis ?-Rhythm control on beta-blocker ?

## 2021-12-06 NOTE — Progress Notes (Signed)
Code stroke ? ?Call time  1035pm ?Beeper time  1035pm ?Exam start  1120 ?Exam end  0929 ?[place]   1125 ?Epic   1125 ?Rad called  1125 ? ? ?

## 2021-12-06 NOTE — Plan of Care (Signed)
?  Problem: Acute Rehab PT Goals(only PT should resolve) ?Goal: Patient Will Transfer Sit To/From Stand ?Outcome: Progressing ?Flowsheets (Taken 12/06/2021 1147) ?Patient will transfer sit to/from stand: with min guard assist ?Goal: Pt Will Transfer Bed To Chair/Chair To Bed ?Outcome: Progressing ?Flowsheets (Taken 12/06/2021 1147) ?Pt will Transfer Bed to Chair/Chair to Bed: ? min guard assist ? with min assist ?Goal: Pt Will Ambulate ?Outcome: Progressing ?Flowsheets (Taken 12/06/2021 1147) ?Pt will Ambulate: ? 15 feet ? with minimal assist ? with least restrictive assistive device ?Goal: Pt/caregiver will Perform Home Exercise Program ?Outcome: Progressing ?Flowsheets (Taken 12/06/2021 1147) ?Pt/caregiver will Perform Home Exercise Program: ? For increased strengthening ? For improved balance ? Independently ? ? ?11:48 AM, 12/06/21 ?Mearl Latin PT, DPT ?Physical Therapist at [facility] ?[facility] ? ?  ?

## 2021-12-06 NOTE — ED Provider Notes (Signed)
?[facility] ?Provider Note ? ?CSN: [id number] ?Arrival date & time: 12/05/21 2229 ? ?Chief Complaint(s) ?Code Stroke ? ?HPI ?[name] is a 81 y.o. female with PMH A-fib, bilateral carotid stenosis, multiple previous CVAs, CAD status post stenting, esophageal varices who presents emergency department for evaluation of dizziness and extremity weakness.  Patient arrives as a code stroke for left upper and left lower extremity weakness with last known well 45 minutes prior to arrival.  EMS states that patient had an episode of acute onset dizziness with left upper and left lower extremity weakness that has since resolved upon arrival to the emergency department.  Here in the emergency department she denies dizziness, chest pain, shortness of breath, abdominal pain of nausea, vomiting or other systemic symptoms.  Denies numbness, tingling, weakness here in the ER. ? ?HPI ? ?Past Medical History ?Past Medical History:  ?Diagnosis Date  ? Anemia   ? Aortic insufficiency   ? Moderate  ? Atrial fibrillation ([place])   ? Back pain   ? Carotid stenosis, bilateral   ? Cirrhosis ([place])   ? Closed left femoral fracture ([place]) 04/21/2017  ? Coronary artery disease   ? Stent x 2 RCA 1995, cardiac catheterization 09/2016 showing only mild atherosclerosis  ? DDD (degenerative disc disease), lumbar   ? Diabetes mellitus without complication ([place])   ? Essential hypertension   ? Falls   ? GERD (gastroesophageal reflux disease)   ? Hiatal hernia   ? History of stroke   ? Hypercholesteremia   ? IBS (irritable bowel syndrome)   ? Non-alcoholic fatty liver disease   ? OSA (obstructive sleep apnea)   ? no CPAP  ? Pericardial effusion   ? a. small by echo 2018.  ? PSVT (paroxysmal supraventricular tachycardia) ([place])   ? Recurrent UTI   ? Seizures ([place]) 2018  ? Stroke [facility]) 07/2019  ? several strokes within the last 10 years  ? Swallowing difficulty   ? Varices, esophageal ([place])   ? ?Patient Active Problem List  ? Diagnosis  Date Noted  ? NASH (nonalcoholic steatohepatitis) 06/05/2020  ? History of esophageal varices 06/05/2020  ? Persistent atrial fibrillation ([place]) 03/09/2020  ? Paroxysmal atrial fibrillation ([place]) 02/24/2020  ? Secondary hypercoagulable state ([place]) 02/24/2020  ? Acute CVA (cerebrovascular accident) ([place]) 01/03/2020  ? Tobacco use disorder 07/09/2018  ? Small vessel disease ([place]) 07/09/2018  ? Benign essential HTN   ? Seizures ([place])   ? Tachypnea   ? Dysphagia   ? Dysarthria 05/19/2018  ? Syncope and collapse   ? Syncope 08/24/2017  ? Closed left femoral fracture ([place]) 04/21/2017  ? Seizure disorder ([place]) 04/21/2017  ? Closed pertrochanteric fracture of femur, left, initial encounter ([place]) 03/02/2017  ? Seizure ([place]) 02/28/2017  ? Hip fracture ([place]) 02/28/2017  ? Diabetes mellitus without complication ([place]) 96/78/9381  ? Epiretinal membrane (ERM) of left eye 01/20/2017  ? Pseudophakia 01/20/2017  ? Chest pain 12/07/2016  ? Atypical chest pain 12/07/2016  ? Tachycardia   ? NSTEMI (non-ST elevated myocardial infarction) ([place]) 11/02/2016  ? Multifocal atrial tachycardia ([place]) 09/25/2016  ? Elevated troponin 09/24/2016  ? Hypokalemia 09/23/2016  ? TIA (transient ischemic attack) 05/05/2016  ? Intractable nausea and vomiting 10/15/2015  ? Acute coronary syndrome ([place]) 06/01/2015  ? Bronchitis 06/01/2015  ? Thrombocytopenia ([place]) 06/01/2015  ? Cerebral infarction ([place]) right occipital due to small vessel disease s/p tPA 03/06/2014  ? Lower urinary tract infectious disease   ? PSVT (paroxysmal supraventricular tachycardia) (  [employer]) 12/09/2013  ? Esophageal varices ([place]) 03/29/2013  ? Hematemesis 03/29/2013  ? Hepatic cirrhosis ([place]) 03/29/2013  ? Presence of stent in right coronary artery 07/04/2011  ? OSA (obstructive sleep apnea) 07/04/2011  ? Aortic sclerosis 07/04/2011  ? Aortic insufficiency 07/04/2011  ? HTN (hypertension) 07/04/2011  ? Carotid stenosis, bilateral 07/04/2011  ? Chest pain at rest 07/03/2011  ? CAD  (coronary artery disease) 07/03/2011  ? Hypercholesteremia 07/03/2011  ? GERD (gastroesophageal reflux disease) 07/03/2011  ? ?Home Medication(s) ?Prior to Admission medications   ?Medication Sig Start Date End Date Taking? Authorizing Provider  ?apixaban (ELIQUIS) 5 MG TABS tablet TAKE (1) TABLET BY MOUTH TWICE DAILY. 11/14/21  Yes Branch, Alphonse Guild, MD  ?atorvastatin (LIPITOR) 80 MG tablet Take 1 tablet (80 mg total) by mouth daily. 07/15/18  Yes Angiulli, Lavon Paganini, PA-C  ?azelastine (ASTELIN) 0.1 % nasal spray Place 1 spray into both nostrils 2 (two) times daily. 11/06/20  Yes [provider]  ?calcium citrate (CALCITRATE - DOSED IN MG ELEMENTAL CALCIUM) 950 MG tablet Take 1 tablet (200 mg of elemental calcium total) by mouth daily. ?Patient taking differently: Take 0.5 tablets by mouth daily. 07/15/18  Yes Angiulli, Lavon Paganini, PA-C  ?carvedilol (COREG) 6.25 MG tablet TAKE (1) TABLET BY MOUTH TWICE DAILY WITH A MEAL. 11/12/21  Yes Montez Morita, Quillian Quince, MD  ?Coenzyme Q10 100 MG capsule Take 100 mg by mouth daily.   Yes [provider]  ?diltiazem (CARDIZEM CD) 180 MG 24 hr capsule Take 1 capsule (180 mg total) by mouth daily. 10/18/21  Yes BranchAlphonse Guild, MD  ?furosemide (LASIX) 20 MG tablet Take 20 mg by mouth daily.   Yes [provider]  ?furosemide (LASIX) 40 MG tablet TAKE (1) TABLET BY MOUTH ONCE DAILY. MAY TAKE ADDITIONAL HALF IF NEEDED FOR SEVERE SWELLING. 10/02/21  Yes Branch, Alphonse Guild, MD  ?methocarbamol (ROBAXIN) 500 MG tablet Take 500 mg by mouth 3 (three) times daily as needed for muscle spasms. 06/27/20  Yes [provider]  ?Multiple Vitamins-Minerals (MULTIVITAMIN GUMMIES ADULT PO) Take 2 tablets by mouth daily.   Yes [provider]  ?nitroGLYCERIN (NITROSTAT) 0.4 MG SL tablet Place under the tongue. 08/22/20  Yes [provider]  ?omeprazole (PRILOSEC) 20 MG capsule TAKE (2) CAPSULES BY MOUTH DAILY BEFORE A MEAL FOR 15 DAYS. 09/23/21  Yes  Harvel Quale, MD  ?ondansetron (ZOFRAN) 4 MG tablet Take 4 mg by mouth every 8 (eight) hours as needed for nausea or vomiting.   Yes [provider]  ?polyethylene glycol powder (GLYCOLAX/MIRALAX) 17 GM/SCOOP powder Take 8.5 g by mouth daily. 09/18/20  Yes Rehman, Mechele Dawley, MD  ?Propylene Glycol (SYSTANE COMPLETE OP) Place 1 drop into both eyes in the morning and at bedtime.   Yes [provider]  ?ramelteon (ROZEREM) 8 MG tablet Take 8 mg by mouth daily. 10/04/21  Yes [provider]  ?acetaminophen (TYLENOL) 500 MG tablet Take 1,000 mg by mouth every 6 (six) hours as needed for moderate pain.    [provider]  ?HYDROcodone-acetaminophen (NORCO) 10-325 MG tablet Take 1 tablet by mouth as needed for pain. 01/11/20   [provider]  ?mirtazapine (REMERON) 45 MG tablet Take 45 mg by mouth at bedtime. ?Patient not taking: Reported on 10/18/2021 01/22/21   [provider]  ?                                                                                                                                  ?  Past Surgical History ?Past Surgical History:  ?Procedure Laterality Date  ? [place]  ? APPENDECTOMY    ? [place]  ? CARDIAC CATHETERIZATION  [phone]  ? Stent to the proximal RCA after MI   ? CARDIAC CATHETERIZATION N/A 09/26/2016  ? Procedure: Left Heart Cath and Coronary Angiography;  Surgeon: Leonie Man, MD;  Location: [facility];  Service: Cardiovascular;  Laterality: N/A;  ? CATARACT EXTRACTION W/PHACO Right 07/04/2014  ? Procedure: CATARACT EXTRACTION PHACO AND INTRAOCULAR LENS PLACEMENT (IOC);  Surgeon: Elta Guadeloupe T. Gershon Crane, MD;  Location: [facility];  Service: Ophthalmology;  Laterality: Right;  CDE:13.13  ? COLONOSCOPY    ? DILATION AND CURETTAGE OF UTERUS    ? x2  ? Epi Retinal Membrane Peel Left   ? ERCP    ? ESOPHAGEAL BANDING N/A 04/01/2013  ? Procedure: ESOPHAGEAL BANDING;  Surgeon: Rogene Houston, MD;   [facility];  Service: Endoscopy;  Laterality: N/A;  ? ESOPHAGEAL BANDING N/A 05/24/2013  ? Procedure: ESOPHAGEAL BANDING;  Surgeon: Rogene Houston, MD;  [facility];  Service: Endoscopy;

## 2021-12-06 NOTE — Assessment & Plan Note (Signed)
Currently on Eliquis and Lipitor ?

## 2021-12-06 NOTE — Assessment & Plan Note (Signed)
Continue Eliquis and Lipitor full dose ?CTA reviewed:  ?

## 2021-12-06 NOTE — Assessment & Plan Note (Signed)
-   Repleting IV and p.o. ?Monitoring closely ?

## 2021-12-06 NOTE — ED Notes (Signed)
Patient's granddaughter called and informed pt is going upstairs per pt request. ?

## 2021-12-06 NOTE — Assessment & Plan Note (Signed)
We will continue current management ?-Continue home medication of Eliquis, Lipitor, Coreg ?

## 2021-12-06 NOTE — Evaluation (Signed)
Occupational Therapy Evaluation Patient Details Name: [name] MRN: [id number] DOB: [birth year] Today's Date: 12/06/2021   History of Present Illness [name] is a 81 y.o. female with PMH A-fib, bilateral carotid stenosis, multiple previous CVAs, CAD status post stenting, esophageal varices who presents emergency department for evaluation of dizziness and extremity weakness.  Patient arrives as a code stroke for left upper and left lower extremity weakness with last known well 45 minutes prior to arrival.  EMS states that patient had an episode of acute onset dizziness with left upper and left lower extremity weakness that has since resolved upon arrival to the emergency department.  Here in the emergency department she denies dizziness, chest pain, shortness of breath, abdominal pain of nausea, vomiting or other systemic symptoms.  Denies numbness, tingling, weakness here in the ER.   Clinical Impression   Pt admitted for concerns listed above. PTA pt reported that she was independent with all ADL's and IADL's, including ambulating with a RW at home. At this time, pt presents with poor motor control of her LLE, especially at the ankle. She is able to stand with mod A, however once in standing she leans to the L and is unable to move the LLE to complete a stand pivot transfer with max A. Overall all LB ADL's requiring max A +1-2 due to apraxic LLE movements and balance deficits. OT recommending AIR to maximize pt's independence and strength prior to returning home. OT will follow acutely.       Recommendations for follow up therapy are one component of a multi-disciplinary discharge planning process, led by the attending physician.  Recommendations may be updated based on patient status, additional functional criteria and insurance authorization.   Follow Up Recommendations  Acute inpatient rehab (3hours/day)    Assistance Recommended at Discharge Frequent or constant  Supervision/Assistance  Patient can return home with the following A lot of help with walking and/or transfers;A lot of help with bathing/dressing/bathroom;Assistance with cooking/housework;Assist for transportation;Help with stairs or ramp for entrance    Functional Status Assessment  Patient has had a recent decline in their functional status and demonstrates the ability to make significant improvements in function in a reasonable and predictable amount of time.  Equipment Recommendations  None recommended by OT    Recommendations for Other Services Rehab consult     Precautions / Restrictions Precautions Precautions: Fall Restrictions Weight Bearing Restrictions: No      Mobility Bed Mobility Overal bed mobility: Needs Assistance Bed Mobility: Supine to Sit, Sit to Supine     Supine to sit: Min assist, HOB elevated Sit to supine: Min assist, HOB elevated   General bed mobility comments: labored, requires increased time    Transfers Overall transfer level: Needs assistance Equipment used: Rolling walker (2 wheels) Transfers: Sit to/from Stand Sit to Stand: Mod assist           General transfer comment: Mod A to power up to standing, unable to maintain weight on LLE to attempt to complete a stand pivot transfer      Balance Overall balance assessment: Needs assistance Sitting-balance support: Bilateral upper extremity supported, Feet supported Sitting balance-Leahy Scale: Fair Sitting balance - Comments: Leaning heavily to L side, however able to correct back to midline with verbal cues Postural control: Left lateral lean Standing balance support: Bilateral upper extremity supported Standing balance-Leahy Scale: Poor Standing balance comment: Pt heavily leaning to L requiring mod-max A to steady in standing, unable to correct  ADL either performed or assessed with clinical judgement   ADL Overall ADL's : Needs  assistance/impaired Eating/Feeding: Set up;Sitting   Grooming: Set up;Sitting   Upper Body Bathing: Min guard;Sitting   Lower Body Bathing: Maximal assistance;+2 for safety/equipment;Sitting/lateral leans;Sit to/from stand   Upper Body Dressing : Minimal assistance;Sitting   Lower Body Dressing: Maximal assistance;+2 for safety/equipment;Sit to/from stand;Sitting/lateral leans   Toilet Transfer: Maximal assistance;+2 for physical assistance;+2 for safety/equipment;Stand-pivot   Toileting- Clothing Manipulation and Hygiene: Maximal assistance;+2 for safety/equipment;Sitting/lateral lean;Sit to/from stand         General ADL Comments: Pt unable to maintain weight in standing without max A due to weakness and apraxia in LLE.     Vision Baseline Vision/History: 1 Wears glasses Ability to See in Adequate Light: 0 Adequate Patient Visual Report: No change from baseline Vision Assessment?: No apparent visual deficits     Perception     Praxis      Pertinent Vitals/Pain Pain Assessment Pain Assessment: No/denies pain     Hand Dominance Right   Extremity/Trunk Assessment Upper Extremity Assessment Upper Extremity Assessment: Generalized weakness   Lower Extremity Assessment Lower Extremity Assessment: Defer to PT evaluation LLE Deficits / Details: Quads 3/5, hamstrings 3/5, ankle DF 2-/5 LLE Sensation: WNL LLE Coordination: decreased gross motor   Cervical / Trunk Assessment Cervical / Trunk Assessment: Normal   Communication Communication Communication: No difficulties   Cognition Arousal/Alertness: Awake/alert Behavior During Therapy: WFL for tasks assessed/performed Overall Cognitive Status: Within Functional Limits for tasks assessed                                       General Comments  VSS on RA    Exercises     Shoulder Instructions      Home Living Family/patient expects to be discharged to:: Private residence Living Arrangements:  Other relatives Available Help at Discharge: Family Type of Home: House Home Access: Stairs to enter [employer] of Steps: 3 steps from car to yard, 2 steps from yard to [employer]: Right Home Layout: One level     Bathroom Shower/Tub: Chief Strategy Officer: Standard Bathroom Accessibility: Yes How Accessible: Accessible via walker Home Equipment: Agricultural consultant (2 wheels);Wheelchair - Customer service manager - single point          Prior Functioning/Environment Prior Level of Function : Independent/Modified Independent             Mobility Comments: Patient states short distance household ambulation with RW, mostly ambulates several steps from bed, toilet, etc. to wheelchair due to fear of falling ADLs Comments: independent with basic ADL, family assist PRN        OT Problem List: Decreased strength;Decreased range of motion;Decreased activity tolerance;Impaired balance (sitting and/or standing);Decreased coordination;Decreased knowledge of use of DME or AE      OT Treatment/Interventions: Self-care/ADL training;Therapeutic exercise;Energy conservation;DME and/or AE instruction;Therapeutic activities;Patient/family education;Balance training    OT Goals(Current goals can be found in the care plan section) Acute Rehab OT Goals Patient Stated Goal: To be able to walk again OT Goal Formulation: With patient Time For Goal Achievement: 12/20/21 Potential to Achieve Goals: Good ADL Goals Pt Will Perform Lower Body Bathing: with min guard assist;sitting/lateral leans;sit to/from stand Pt Will Perform Lower Body Dressing: with min guard assist;sitting/lateral leans;sit to/from stand Pt Will Transfer to Toilet: with min guard assist;stand pivot transfer Pt Will Perform Toileting -  Clothing Manipulation and hygiene: with min guard assist;sitting/lateral leans;sit to/from stand Additional ADL Goal #1: Pt will maintain midline  sitting balance EOB for 5 mins as a precursor for seated ADL tasks.  OT Frequency: Min 2X/week    Co-evaluation              AM-PAC OT "6 Clicks" Daily Activity     Outcome Measure Help from another person eating meals?: A Little Help from another person taking care of personal grooming?: A Little Help from another person toileting, which includes using toliet, bedpan, or urinal?: A Lot Help from another person bathing (including washing, rinsing, drying)?: A Lot Help from another person to put on and taking off regular upper body clothing?: A Little Help from another person to put on and taking off regular lower body clothing?: A Lot 6 Click Score: 15   End of Session Equipment Utilized During Treatment: Gait belt;Rolling walker (2 wheels) Nurse Communication: Mobility status  Activity Tolerance: Patient tolerated treatment well Patient left: in bed;with call bell/phone within reach  OT Visit Diagnosis: Unsteadiness on feet (R26.81);Other abnormalities of gait and mobility (R26.89);Muscle weakness (generalized) (M62.81)                Time: 2130-8657 OT Time Calculation (min): 24 min Charges:  OT General Charges $OT Visit: 1 Visit OT Evaluation $OT Eval Moderate Complexity: 1 Mod OT Treatments $Therapeutic Activity: 8-22 mins  Annaliah Rivenbark H., OTR/L Acute Rehabilitation  Lorissa Kishbaugh Elane Bing Plume 12/06/2021, 12:32 PM

## 2021-12-06 NOTE — Assessment & Plan Note (Signed)
Previous history ?Currently on high-dose statins, Eliquis ?

## 2021-12-06 NOTE — ED Notes (Signed)
Pt returned from CT scan.

## 2021-12-06 NOTE — Assessment & Plan Note (Signed)
-   Reviewing and resuming home medication accordingly ?-Holding for now--for evaluation of ruling out acute CVA, pursuing permissive hypertension ?

## 2021-12-06 NOTE — Patient Outreach (Signed)
[facility]) Care Management ? ?12/06/2021 ? ?[name] ?[birth year] ?[id number] ? ?Patient was admitted to the hospital on 12/05/21. Nurse Health Coach will perform case closure and transfer patient to the [place] Team. Nurse has informed [facility] Coordinator of patient's admission.  ?  ?Plan: ?RN Health Coach Discipline Closure ?Discipline Closure letter sent to PCP  ? ?Emelia Loron RN, BSN ?Nurse Health Coach ?[facility] ?[phone] ?[email or web address] ? ? ?

## 2021-12-06 NOTE — Hospital Course (Signed)
[name] IS A 81 y.o F with PMHx of A-fib on Eliquis, bilateral carotid stenosis, coronary artery disease with stent, previous CVAs, NASH, with esophageal varices, HTN, HLD, GERD, aortic sclerosis, OSA, ?Presenting with acute onset of dizziness, and extremity weaknesses.  Patient was evaluated EEG possible dizziness, left upper and lower extremity weakness which lasted 45 minutes to arrival, patient was asymptomatic in ED.  Per EMS patient had an episode of acute dizziness and left upper and lower extremity weakness that has since resolved. ? ?Patient currently stable denies any dizziness or upper or lower extremity weakness, denies of having any numbness,.  Denies of having any shortness of breath, chest pain, abdominal pain, nausea, vomiting, diarrhea, or constipation. ? ?ED: ?Blood pressure (!) 129/52, pulse 75, temperature 98.2 ?F (36.8 ?C), temperature source Oral, resp. rate 18, height 5' 3"  (1.6 m), weight 63 kg, SpO2 96 %. ? ?Labs potassium 2.7, calcium 8.4, INR 1.8, glucose at 73, magnesium 2 ?UA within normal limit, drug screen negative ?Fluids A/B, SARS-CoV-2 all negative ? ?CT angio head and neck: ?IMPRESSION: ?1. Increasing attenuation with near occlusion of the intracranial left V4 segment, new as compared to prior CTA from 01/03/2020. This could be acute in nature given the probable evolving left cerebellar ischemic changes on prior noncontrast head CT. ?2. No other large vessel occlusion or other emergent finding. ?3. Short-segment 70% stenosis at the left carotid bulb/proximal cervical left ICA, similar to previous. ?4. Focal moderate right P2 stenosis, also similar. ?5. Right larger than left layering pleural effusions, partially visualized. ? ?CT head  IMPRESSION: ?1. Focal hypodensity involving the left cerebellum, consistent with an age-indeterminate infarct, possibly acute to subacute in nature. No acute intracranial hemorrhage. ?2. ASPECTS is 10. ?3. Chronic right MCA distribution  infarct, stable. ?4. Underlying atrophy with chronic small vessel ischemic disease and multiple remote lacunar infarcts about the basal ganglia. ?

## 2021-12-06 NOTE — Assessment & Plan Note (Signed)
-   Continue high-dose statin ?

## 2021-12-06 NOTE — Progress Notes (Signed)
*  PRELIMINARY RESULTS* ?Echocardiogram ?2D Echocardiogram has been performed. ? ?[name] ?12/06/2021, 11:33 AM ?

## 2021-12-06 NOTE — Progress Notes (Signed)
Inpatient Rehab Admissions Coordinator Note:  ? ?Per therapy recommendations patient was screened for CIR candidacy by Michel Santee, PT. At this time, pt appears to be a potential candidate for CIR. I will place an order for rehab consult for full assessment, per our protocol.  Please contact me any with questions.. ? ?Shann Medal, PT, DPT ?[phone] ?12/06/21 ?3:31 PM ? ?

## 2021-12-06 NOTE — Assessment & Plan Note (Signed)
Currently stable not on any antiseizure medications at home, ?Continue home psych medication of her Ramelteon  ?

## 2021-12-10 DIAGNOSIS — I10 Essential (primary) hypertension: Secondary | ICD-10-CM | POA: Diagnosis not present

## 2021-12-10 DIAGNOSIS — E1140 Type 2 diabetes mellitus with diabetic neuropathy, unspecified: Secondary | ICD-10-CM | POA: Diagnosis not present

## 2021-12-10 DIAGNOSIS — I6930 Unspecified sequelae of cerebral infarction: Secondary | ICD-10-CM | POA: Diagnosis not present

## 2021-12-10 DIAGNOSIS — I4891 Unspecified atrial fibrillation: Secondary | ICD-10-CM | POA: Diagnosis not present

## 2021-12-10 DIAGNOSIS — Z6824 Body mass index (BMI) 24.0-24.9, adult: Secondary | ICD-10-CM | POA: Diagnosis not present

## 2021-12-11 DIAGNOSIS — I119 Hypertensive heart disease without heart failure: Secondary | ICD-10-CM | POA: Diagnosis not present

## 2021-12-11 DIAGNOSIS — G4733 Obstructive sleep apnea (adult) (pediatric): Secondary | ICD-10-CM | POA: Diagnosis not present

## 2021-12-11 DIAGNOSIS — Z7901 Long term (current) use of anticoagulants: Secondary | ICD-10-CM | POA: Diagnosis not present

## 2021-12-11 DIAGNOSIS — K7460 Unspecified cirrhosis of liver: Secondary | ICD-10-CM | POA: Diagnosis not present

## 2021-12-11 DIAGNOSIS — I2510 Atherosclerotic heart disease of native coronary artery without angina pectoris: Secondary | ICD-10-CM | POA: Diagnosis not present

## 2021-12-11 DIAGNOSIS — K219 Gastro-esophageal reflux disease without esophagitis: Secondary | ICD-10-CM | POA: Diagnosis not present

## 2021-12-11 DIAGNOSIS — I4891 Unspecified atrial fibrillation: Secondary | ICD-10-CM | POA: Diagnosis not present

## 2021-12-11 DIAGNOSIS — Z9181 History of falling: Secondary | ICD-10-CM | POA: Diagnosis not present

## 2021-12-11 DIAGNOSIS — E7800 Pure hypercholesterolemia, unspecified: Secondary | ICD-10-CM | POA: Diagnosis not present

## 2021-12-11 DIAGNOSIS — I471 Supraventricular tachycardia: Secondary | ICD-10-CM | POA: Diagnosis not present

## 2021-12-11 DIAGNOSIS — I700 Atherosclerosis of aorta: Secondary | ICD-10-CM | POA: Diagnosis not present

## 2021-12-11 DIAGNOSIS — D696 Thrombocytopenia, unspecified: Secondary | ICD-10-CM | POA: Diagnosis not present

## 2021-12-11 DIAGNOSIS — G43909 Migraine, unspecified, not intractable, without status migrainosus: Secondary | ICD-10-CM | POA: Diagnosis not present

## 2021-12-11 DIAGNOSIS — I69354 Hemiplegia and hemiparesis following cerebral infarction affecting left non-dominant side: Secondary | ICD-10-CM | POA: Diagnosis not present

## 2021-12-11 NOTE — Progress Notes (Signed)
Carelink Summary Report / Loop Recorder 

## 2021-12-13 ENCOUNTER — Other Ambulatory Visit (INDEPENDENT_AMBULATORY_CARE_PROVIDER_SITE_OTHER): Payer: Self-pay | Admitting: Gastroenterology

## 2021-12-13 DIAGNOSIS — K219 Gastro-esophageal reflux disease without esophagitis: Secondary | ICD-10-CM

## 2021-12-16 DIAGNOSIS — I2510 Atherosclerotic heart disease of native coronary artery without angina pectoris: Secondary | ICD-10-CM | POA: Diagnosis not present

## 2021-12-16 DIAGNOSIS — Z9181 History of falling: Secondary | ICD-10-CM | POA: Diagnosis not present

## 2021-12-16 DIAGNOSIS — K7460 Unspecified cirrhosis of liver: Secondary | ICD-10-CM | POA: Diagnosis not present

## 2021-12-16 DIAGNOSIS — Z7901 Long term (current) use of anticoagulants: Secondary | ICD-10-CM | POA: Diagnosis not present

## 2021-12-16 DIAGNOSIS — G43909 Migraine, unspecified, not intractable, without status migrainosus: Secondary | ICD-10-CM | POA: Diagnosis not present

## 2021-12-16 DIAGNOSIS — I700 Atherosclerosis of aorta: Secondary | ICD-10-CM | POA: Diagnosis not present

## 2021-12-16 DIAGNOSIS — K219 Gastro-esophageal reflux disease without esophagitis: Secondary | ICD-10-CM | POA: Diagnosis not present

## 2021-12-16 DIAGNOSIS — I4891 Unspecified atrial fibrillation: Secondary | ICD-10-CM | POA: Diagnosis not present

## 2021-12-16 DIAGNOSIS — E7800 Pure hypercholesterolemia, unspecified: Secondary | ICD-10-CM | POA: Diagnosis not present

## 2021-12-16 DIAGNOSIS — I69354 Hemiplegia and hemiparesis following cerebral infarction affecting left non-dominant side: Secondary | ICD-10-CM | POA: Diagnosis not present

## 2021-12-16 DIAGNOSIS — G4733 Obstructive sleep apnea (adult) (pediatric): Secondary | ICD-10-CM | POA: Diagnosis not present

## 2021-12-16 DIAGNOSIS — D696 Thrombocytopenia, unspecified: Secondary | ICD-10-CM | POA: Diagnosis not present

## 2021-12-16 DIAGNOSIS — I119 Hypertensive heart disease without heart failure: Secondary | ICD-10-CM | POA: Diagnosis not present

## 2021-12-16 DIAGNOSIS — I471 Supraventricular tachycardia: Secondary | ICD-10-CM | POA: Diagnosis not present

## 2021-12-16 NOTE — Telephone Encounter (Signed)
Per last note from Dr. Jenetta Downer on 09/20/2021 we will refill for the next three months,but will need an office visit. Patient has an appointment scheduled for 12/30/2021 with [facility]. ?

## 2021-12-18 DIAGNOSIS — K7460 Unspecified cirrhosis of liver: Secondary | ICD-10-CM | POA: Diagnosis not present

## 2021-12-18 DIAGNOSIS — Z9181 History of falling: Secondary | ICD-10-CM | POA: Diagnosis not present

## 2021-12-18 DIAGNOSIS — I471 Supraventricular tachycardia: Secondary | ICD-10-CM | POA: Diagnosis not present

## 2021-12-18 DIAGNOSIS — I4891 Unspecified atrial fibrillation: Secondary | ICD-10-CM | POA: Diagnosis not present

## 2021-12-18 DIAGNOSIS — I700 Atherosclerosis of aorta: Secondary | ICD-10-CM | POA: Diagnosis not present

## 2021-12-18 DIAGNOSIS — Z7901 Long term (current) use of anticoagulants: Secondary | ICD-10-CM | POA: Diagnosis not present

## 2021-12-18 DIAGNOSIS — G4733 Obstructive sleep apnea (adult) (pediatric): Secondary | ICD-10-CM | POA: Diagnosis not present

## 2021-12-18 DIAGNOSIS — I119 Hypertensive heart disease without heart failure: Secondary | ICD-10-CM | POA: Diagnosis not present

## 2021-12-18 DIAGNOSIS — G43909 Migraine, unspecified, not intractable, without status migrainosus: Secondary | ICD-10-CM | POA: Diagnosis not present

## 2021-12-18 DIAGNOSIS — E7800 Pure hypercholesterolemia, unspecified: Secondary | ICD-10-CM | POA: Diagnosis not present

## 2021-12-18 DIAGNOSIS — I69354 Hemiplegia and hemiparesis following cerebral infarction affecting left non-dominant side: Secondary | ICD-10-CM | POA: Diagnosis not present

## 2021-12-18 DIAGNOSIS — D696 Thrombocytopenia, unspecified: Secondary | ICD-10-CM | POA: Diagnosis not present

## 2021-12-18 DIAGNOSIS — I2510 Atherosclerotic heart disease of native coronary artery without angina pectoris: Secondary | ICD-10-CM | POA: Diagnosis not present

## 2021-12-18 DIAGNOSIS — K219 Gastro-esophageal reflux disease without esophagitis: Secondary | ICD-10-CM | POA: Diagnosis not present

## 2021-12-23 ENCOUNTER — Ambulatory Visit: Payer: Medicare Other | Admitting: Diagnostic Neuroimaging

## 2021-12-23 ENCOUNTER — Encounter: Payer: Self-pay | Admitting: Diagnostic Neuroimaging

## 2021-12-23 VITALS — BP 140/60 | HR 76 | Ht 63.0 in

## 2021-12-23 DIAGNOSIS — Z9181 History of falling: Secondary | ICD-10-CM | POA: Diagnosis not present

## 2021-12-23 DIAGNOSIS — E7800 Pure hypercholesterolemia, unspecified: Secondary | ICD-10-CM | POA: Diagnosis not present

## 2021-12-23 DIAGNOSIS — I700 Atherosclerosis of aorta: Secondary | ICD-10-CM | POA: Diagnosis not present

## 2021-12-23 DIAGNOSIS — I350 Nonrheumatic aortic (valve) stenosis: Secondary | ICD-10-CM

## 2021-12-23 DIAGNOSIS — I639 Cerebral infarction, unspecified: Secondary | ICD-10-CM | POA: Diagnosis not present

## 2021-12-23 DIAGNOSIS — K219 Gastro-esophageal reflux disease without esophagitis: Secondary | ICD-10-CM | POA: Diagnosis not present

## 2021-12-23 DIAGNOSIS — I69354 Hemiplegia and hemiparesis following cerebral infarction affecting left non-dominant side: Secondary | ICD-10-CM | POA: Diagnosis not present

## 2021-12-23 DIAGNOSIS — I4891 Unspecified atrial fibrillation: Secondary | ICD-10-CM | POA: Diagnosis not present

## 2021-12-23 DIAGNOSIS — G43909 Migraine, unspecified, not intractable, without status migrainosus: Secondary | ICD-10-CM | POA: Diagnosis not present

## 2021-12-23 DIAGNOSIS — I2510 Atherosclerotic heart disease of native coronary artery without angina pectoris: Secondary | ICD-10-CM | POA: Diagnosis not present

## 2021-12-23 DIAGNOSIS — D696 Thrombocytopenia, unspecified: Secondary | ICD-10-CM | POA: Diagnosis not present

## 2021-12-23 DIAGNOSIS — G4733 Obstructive sleep apnea (adult) (pediatric): Secondary | ICD-10-CM | POA: Diagnosis not present

## 2021-12-23 DIAGNOSIS — Z7901 Long term (current) use of anticoagulants: Secondary | ICD-10-CM | POA: Diagnosis not present

## 2021-12-23 DIAGNOSIS — I480 Paroxysmal atrial fibrillation: Secondary | ICD-10-CM

## 2021-12-23 DIAGNOSIS — K7460 Unspecified cirrhosis of liver: Secondary | ICD-10-CM | POA: Diagnosis not present

## 2021-12-23 DIAGNOSIS — I471 Supraventricular tachycardia: Secondary | ICD-10-CM | POA: Diagnosis not present

## 2021-12-23 DIAGNOSIS — I119 Hypertensive heart disease without heart failure: Secondary | ICD-10-CM | POA: Diagnosis not present

## 2021-12-23 NOTE — Progress Notes (Signed)
? ?[facility] ? ?PATIENT: [name] ?DOB: [birth year] ? ?REFERRING CLINICIAN: Redmond School, MD ?HISTORY FROM: patient  ?REASON FOR VISIT: new consult ? ? ?HISTORICAL ? ?CHIEF COMPLAINT:  ?Chief Complaint  ?Patient presents with  ? Cerebrovascular Accident  ?  Rm 6  hospital FU, dgtr- in-lawLelon Frohlich, grand dgtr/caregiver- [name] ?"Getting PT/OT, may refer for ST due to swallowing issues"  ? ? ?HISTORY OF PRESENT ILLNESS:  ? ?UPDATE (12/23/21, VRP): Since last visit, had sudden nausea and weakness on 12/05/21. Went to ER and found to have some punctate infarcts in the left cerebellum (acute-subacute). Also new infarcts since 2021. Getting home PT/OT. Having diff with swallowing. Losing motivation for PT. Still smoking 1 pack per week cigs.  ? ?UPDATE (02/05/21, VRP): Since last visit, doing well until 6 weeks ago --> 2 days of increased left leg weakness. Also had some headaches. Symptoms are improved spontaneously.  ? ?PRIOR HPI: 81 year old female here for evaluation of left leg pain and weakness.  History of strokes, atrial fibrillation, hypertension, hyperlipidemia.  Patient was hospitalized in April 2021 for recurrent embolic strokes, then found to have atrial fibrillation, now on Eliquis. ? ?Since that time patient was doing well at home with home PT.  At some point home PT completed and patient was less active.  Following this she felt more left leg pain and weakness.  She feels pain in her left hip and left lower back region. ? ? ?REVIEW OF SYSTEMS: Full 14 system review of systems performed and negative with exception of: As per HPI. ? ?ALLERGIES: ?Allergies  ?Allergen Reactions  ? Tape Rash and Other (See Comments)  ?  Please use paper tape  ? ? ?HOME MEDICATIONS: ?Outpatient Medications Prior to Visit  ?Medication Sig Dispense Refill  ? acetaminophen (TYLENOL) 500 MG tablet Take 1,000 mg by mouth every 6 (six) hours as needed for moderate pain.    ? apixaban (ELIQUIS) 5 MG TABS  tablet TAKE (1) TABLET BY MOUTH TWICE DAILY. 60 tablet 5  ? atorvastatin (LIPITOR) 80 MG tablet Take 1 tablet (80 mg total) by mouth daily. 30 tablet 1  ? azelastine (ASTELIN) 0.1 % nasal spray Place 1 spray into both nostrils 2 (two) times daily.    ? calcium citrate (CALCITRATE - DOSED IN MG ELEMENTAL CALCIUM) 950 MG tablet Take 1 tablet (200 mg of elemental calcium total) by mouth daily. (Patient taking differently: Take 0.5 tablets by mouth daily.) 30 tablet 1  ? carvedilol (COREG) 6.25 MG tablet TAKE (1) TABLET BY MOUTH TWICE DAILY WITH A MEAL. 180 tablet 0  ? Coenzyme Q10 100 MG capsule Take 100 mg by mouth daily.    ? diltiazem (CARDIZEM CD) 180 MG 24 hr capsule Take 1 capsule (180 mg total) by mouth daily. 90 capsule 3  ? furosemide (LASIX) 20 MG tablet Take 20 mg by mouth daily.    ? furosemide (LASIX) 40 MG tablet TAKE (1) TABLET BY MOUTH ONCE DAILY. MAY TAKE ADDITIONAL HALF IF NEEDED FOR SEVERE SWELLING. 45 tablet 0  ? Multiple Vitamins-Minerals (MULTIVITAMIN GUMMIES ADULT PO) Take 2 tablets by mouth daily.    ? nitroGLYCERIN (NITROSTAT) 0.4 MG SL tablet Place under the tongue.    ? omeprazole (PRILOSEC) 20 MG capsule TAKE (2) CAPSULES BY MOUTH DAILY BEFORE A MEAL FOR 15 DAYS. 90 capsule 0  ? ondansetron (ZOFRAN) 4 MG tablet Take 4 mg by mouth every 8 (eight) hours as needed for nausea or vomiting.    ?  polyethylene glycol powder (GLYCOLAX/MIRALAX) 17 GM/SCOOP powder Take 8.5 g by mouth daily. 255 g 0  ? Propylene Glycol (SYSTANE COMPLETE OP) Place 1 drop into both eyes in the morning and at bedtime.    ? ramelteon (ROZEREM) 8 MG tablet Take 8 mg by mouth daily.    ? ?No facility-administered medications prior to visit.  ? ? ?PAST MEDICAL HISTORY: ?Past Medical History:  ?Diagnosis Date  ? Anemia   ? Aortic insufficiency   ? Moderate  ? Atrial fibrillation ([place])   ? Back pain   ? Carotid stenosis, bilateral   ? Cirrhosis ([place])   ? Closed left femoral fracture ([place]) 04/21/2017  ? Coronary artery disease    ? Stent x 2 RCA 1995, cardiac catheterization 09/2016 showing only mild atherosclerosis  ? DDD (degenerative disc disease), lumbar   ? Diabetes mellitus without complication ([place])   ? Essential hypertension   ? Falls   ? GERD (gastroesophageal reflux disease)   ? Hiatal hernia   ? History of stroke   ? Hypercholesteremia   ? IBS (irritable bowel syndrome)   ? Non-alcoholic fatty liver disease   ? OSA (obstructive sleep apnea)   ? no CPAP  ? Pericardial effusion   ? a. small by echo 2018.  ? PSVT (paroxysmal supraventricular tachycardia) ([place])   ? Recurrent UTI   ? Seizures ([place]) 2018  ? Stroke [facility]) 07/2019  ? several strokes within the last 10 years  ? Swallowing difficulty   ? Varices, esophageal ([place])   ? ? ?PAST SURGICAL HISTORY: ?Past Surgical History:  ?Procedure Laterality Date  ? [place]  ? APPENDECTOMY    ? [place]  ? CARDIAC CATHETERIZATION  [phone]  ? Stent to the proximal RCA after MI   ? CARDIAC CATHETERIZATION N/A 09/26/2016  ? Procedure: Left Heart Cath and Coronary Angiography;  Surgeon: Leonie Man, MD;  Location: [facility];  Service: Cardiovascular;  Laterality: N/A;  ? CATARACT EXTRACTION W/PHACO Right 07/04/2014  ? Procedure: CATARACT EXTRACTION PHACO AND INTRAOCULAR LENS PLACEMENT (IOC);  Surgeon: Elta Guadeloupe T. Gershon Crane, MD;  Location: [facility];  Service: Ophthalmology;  Laterality: Right;  CDE:13.13  ? COLONOSCOPY    ? DILATION AND CURETTAGE OF UTERUS    ? x2  ? Epi Retinal Membrane Peel Left   ? ERCP    ? ESOPHAGEAL BANDING N/A 04/01/2013  ? Procedure: ESOPHAGEAL BANDING;  Surgeon: Rogene Houston, MD;  [facility]: [facility];  Service: Endoscopy;  Laterality: N/A;  ? ESOPHAGEAL BANDING N/A 05/24/2013  ? Procedure: ESOPHAGEAL BANDING;  Surgeon: Rogene Houston, MD;  [facility];  Service: Endoscopy;  Laterality: N/A;  ? ESOPHAGEAL BANDING N/A 06/21/2014  ? Procedure: ESOPHAGEAL BANDING;  Surgeon: Rogene Houston, MD;  [facility];   Service: Endoscopy;  Laterality: N/A;  ? ESOPHAGEAL BANDING  06/15/2020  ? Procedure: ESOPHAGEAL BANDING;  Surgeon: Montez Morita, Quillian Quince, MD;  [facility];  Service: Gastroenterology;;  ? ESOPHAGOGASTRODUODENOSCOPY N/A 04/01/2013  ? Procedure: ESOPHAGOGASTRODUODENOSCOPY (EGD);  Surgeon: Rogene Houston, MD;  [facility];  Service: Endoscopy;  Laterality: N/A;  230-rescheduled to 8:30am Lelon Frohlich notified pt  ? ESOPHAGOGASTRODUODENOSCOPY N/A 05/24/2013  ? Procedure: ESOPHAGOGASTRODUODENOSCOPY (EGD);  Surgeon: Rogene Houston, MD;  [facility];  Service: Endoscopy;  Laterality: N/A;  730  ? ESOPHAGOGASTRODUODENOSCOPY N/A 06/21/2014  ? Procedure: ESOPHAGOGASTRODUODENOSCOPY (EGD);  Surgeon: Rogene Houston, MD;  [facility];  Service: Endoscopy;  Laterality: N/A;  930-rescheduled 10/14 @ 1200 Ann to notify pt  ? ESOPHAGOGASTRODUODENOSCOPY (EGD) WITH PROPOFOL N/A 06/15/2020  ? Procedure: ESOPHAGOGASTRODUODENOSCOPY (EGD) WITH PROPOFOL;  Surgeon: Harvel Quale, MD;  [facility];  Service: Gastroenterology;  Laterality: N/A;  230  ? ESOPHAGOGASTRODUODENOSCOPY (EGD) WITH PROPOFOL N/A 07/27/2020  ? Procedure: ESOPHAGOGASTRODUODENOSCOPY (EGD) WITH PROPOFOL;  Surgeon: Harvel Quale, MD;  [facility];  Service: Gastroenterology;  Laterality: N/A;  1245  ? EYE SURGERY  08  ? cataract surgery of the left eye  ? FEMUR IM NAIL Left 03/02/2017  ? Procedure: INTRAMEDULLARY (IM) NAIL FEMORAL;  Surgeon: Rod Can, MD;  Location: [facility];  Service: Orthopedics;  Laterality: Left;  ? HARDWARE REMOVAL Right 01/17/2013  ? Procedure: REMOVAL OF HARDWARE AND EXCISION ULNAR STYLOID RIGHT WRIST;  Surgeon: Tennis Must, MD;  Location: [facility];  Service: Orthopedics;  Laterality: Right;  ? implanted heart monitor  02/2020  ? to monitor Afib  ? MYRINGOTOMY  2012  ? both ears  ? ORIF FEMUR FRACTURE Left 04/23/2017  ? Procedure: OPEN  REDUCTION INTERNAL FIXATION (ORIF) DISTAL FEMUR FRACTURE (FRACTURE AROUND FEMORAL NAIL);  Surgeon: Altamese Montrose, MD;  Location: [facility];  Service: Orthopedics;  Laterality: Left;  ? TONSILLECTOMY    ? TUBAL LIGA

## 2021-12-23 NOTE — Patient Instructions (Signed)
STROKE PREVENTION (afib, lipids, BP, smoking) ?- continue eliquis, atorvastatin, BP control; stop smoking ? ?AORTIC STENOSIS / CALCIFICATION ?- follow up with cardiology ? ?LEFT HIP / LEG PAIN / LOW BACK PAIN ?- continue PT exercises ? ?GAIT DIFFICULTY ?- continue PT exercises ?

## 2021-12-24 ENCOUNTER — Telehealth: Payer: Self-pay

## 2021-12-24 ENCOUNTER — Ambulatory Visit (HOSPITAL_COMMUNITY)
Admission: RE | Admit: 2021-12-24 | Discharge: 2021-12-24 | Disposition: A | Discharge status: Home / Self Care | Payer: Medicare Other | Source: Ambulatory Visit | Attending: Internal Medicine | Admitting: Internal Medicine

## 2021-12-24 ENCOUNTER — Telehealth: Payer: Self-pay | Admitting: Cardiology

## 2021-12-24 DIAGNOSIS — N6320 Unspecified lump in the left breast, unspecified quadrant: Secondary | ICD-10-CM | POA: Diagnosis present

## 2021-12-24 DIAGNOSIS — N6324 Unspecified lump in the left breast, lower inner quadrant: Secondary | ICD-10-CM | POA: Diagnosis not present

## 2021-12-24 DIAGNOSIS — R928 Other abnormal and inconclusive findings on diagnostic imaging of breast: Secondary | ICD-10-CM | POA: Diagnosis not present

## 2021-12-24 NOTE — Telephone Encounter (Signed)
The patient granddaughter had questions about the loop moving down further in her chest. I let her speak with Leigh, rn. ?

## 2021-12-24 NOTE — Telephone Encounter (Signed)
I will FYI Dr.Branch ?

## 2021-12-24 NOTE — Telephone Encounter (Signed)
Patients granddaughter called and reports patient had mammogram today and was told her loop recorder had moved down her breast below her areola to the under area of her breast.  Denies pain. Monitor checked and transmission was successful today. ? ?Advised I will forward to Dr. Lovena Le for his recommendations. ?

## 2021-12-24 NOTE — Telephone Encounter (Signed)
Patient's granddaughter is calling stating the patient was recently hospitalized and had an echo performed while there. They advised it was a TIA and recommended to call the office and see what Dr. Harl Bowie advises.  ?

## 2021-12-25 ENCOUNTER — Ambulatory Visit: Payer: Medicare Other | Admitting: *Deleted

## 2021-12-25 DIAGNOSIS — I119 Hypertensive heart disease without heart failure: Secondary | ICD-10-CM | POA: Diagnosis not present

## 2021-12-25 DIAGNOSIS — Z9181 History of falling: Secondary | ICD-10-CM | POA: Diagnosis not present

## 2021-12-25 DIAGNOSIS — G4733 Obstructive sleep apnea (adult) (pediatric): Secondary | ICD-10-CM | POA: Diagnosis not present

## 2021-12-25 DIAGNOSIS — Z7901 Long term (current) use of anticoagulants: Secondary | ICD-10-CM | POA: Diagnosis not present

## 2021-12-25 DIAGNOSIS — G43909 Migraine, unspecified, not intractable, without status migrainosus: Secondary | ICD-10-CM | POA: Diagnosis not present

## 2021-12-25 DIAGNOSIS — E7800 Pure hypercholesterolemia, unspecified: Secondary | ICD-10-CM | POA: Diagnosis not present

## 2021-12-25 DIAGNOSIS — I4891 Unspecified atrial fibrillation: Secondary | ICD-10-CM | POA: Diagnosis not present

## 2021-12-25 DIAGNOSIS — I69354 Hemiplegia and hemiparesis following cerebral infarction affecting left non-dominant side: Secondary | ICD-10-CM | POA: Diagnosis not present

## 2021-12-25 DIAGNOSIS — K219 Gastro-esophageal reflux disease without esophagitis: Secondary | ICD-10-CM | POA: Diagnosis not present

## 2021-12-25 DIAGNOSIS — I471 Supraventricular tachycardia: Secondary | ICD-10-CM | POA: Diagnosis not present

## 2021-12-25 DIAGNOSIS — I700 Atherosclerosis of aorta: Secondary | ICD-10-CM | POA: Diagnosis not present

## 2021-12-25 DIAGNOSIS — I2510 Atherosclerotic heart disease of native coronary artery without angina pectoris: Secondary | ICD-10-CM | POA: Diagnosis not present

## 2021-12-25 DIAGNOSIS — D696 Thrombocytopenia, unspecified: Secondary | ICD-10-CM | POA: Diagnosis not present

## 2021-12-25 DIAGNOSIS — K7460 Unspecified cirrhosis of liver: Secondary | ICD-10-CM | POA: Diagnosis not present

## 2021-12-30 ENCOUNTER — Ambulatory Visit (INDEPENDENT_AMBULATORY_CARE_PROVIDER_SITE_OTHER): Payer: Medicare Other | Admitting: Gastroenterology

## 2021-12-30 ENCOUNTER — Encounter (INDEPENDENT_AMBULATORY_CARE_PROVIDER_SITE_OTHER): Payer: Self-pay | Admitting: Gastroenterology

## 2021-12-30 VITALS — BP 111/45 | HR 48 | Temp 98.8°F | Ht 63.0 in | Wt 134.5 lb

## 2021-12-30 DIAGNOSIS — I119 Hypertensive heart disease without heart failure: Secondary | ICD-10-CM | POA: Diagnosis not present

## 2021-12-30 DIAGNOSIS — R112 Nausea with vomiting, unspecified: Secondary | ICD-10-CM

## 2021-12-30 DIAGNOSIS — R1310 Dysphagia, unspecified: Secondary | ICD-10-CM | POA: Diagnosis not present

## 2021-12-30 DIAGNOSIS — G43909 Migraine, unspecified, not intractable, without status migrainosus: Secondary | ICD-10-CM | POA: Diagnosis not present

## 2021-12-30 DIAGNOSIS — I4891 Unspecified atrial fibrillation: Secondary | ICD-10-CM | POA: Diagnosis not present

## 2021-12-30 DIAGNOSIS — G4733 Obstructive sleep apnea (adult) (pediatric): Secondary | ICD-10-CM | POA: Diagnosis not present

## 2021-12-30 DIAGNOSIS — Z9181 History of falling: Secondary | ICD-10-CM | POA: Diagnosis not present

## 2021-12-30 DIAGNOSIS — K7460 Unspecified cirrhosis of liver: Secondary | ICD-10-CM

## 2021-12-30 DIAGNOSIS — I8510 Secondary esophageal varices without bleeding: Secondary | ICD-10-CM

## 2021-12-30 DIAGNOSIS — I471 Supraventricular tachycardia: Secondary | ICD-10-CM | POA: Diagnosis not present

## 2021-12-30 DIAGNOSIS — K219 Gastro-esophageal reflux disease without esophagitis: Secondary | ICD-10-CM | POA: Diagnosis not present

## 2021-12-30 DIAGNOSIS — E7800 Pure hypercholesterolemia, unspecified: Secondary | ICD-10-CM | POA: Diagnosis not present

## 2021-12-30 DIAGNOSIS — I700 Atherosclerosis of aorta: Secondary | ICD-10-CM | POA: Diagnosis not present

## 2021-12-30 DIAGNOSIS — D696 Thrombocytopenia, unspecified: Secondary | ICD-10-CM | POA: Diagnosis not present

## 2021-12-30 DIAGNOSIS — I69354 Hemiplegia and hemiparesis following cerebral infarction affecting left non-dominant side: Secondary | ICD-10-CM | POA: Diagnosis not present

## 2021-12-30 DIAGNOSIS — K2100 Gastro-esophageal reflux disease with esophagitis, without bleeding: Secondary | ICD-10-CM | POA: Diagnosis not present

## 2021-12-30 DIAGNOSIS — Z7901 Long term (current) use of anticoagulants: Secondary | ICD-10-CM | POA: Diagnosis not present

## 2021-12-30 DIAGNOSIS — I2510 Atherosclerotic heart disease of native coronary artery without angina pectoris: Secondary | ICD-10-CM | POA: Diagnosis not present

## 2021-12-30 MED ORDER — CARVEDILOL 6.25 MG PO TABS: 6.2500 mg | ORAL_TABLET | Freq: Two times a day (BID) | ORAL | 3 refills | Status: DC

## 2021-12-30 MED ORDER — OMEPRAZOLE 40 MG PO CPDR: 40.0000 mg | DELAYED_RELEASE_CAPSULE | Freq: Every day | ORAL | 3 refills | Status: DC

## 2021-12-30 MED ORDER — ONDANSETRON HCL 4 MG PO TABS: 4.0000 mg | ORAL_TABLET | Freq: Three times a day (TID) | ORAL | 1 refills | Status: DC | PRN

## 2021-12-30 NOTE — Patient Instructions (Signed)
-  I am sending zofran to be taken every 8 hours as needed for nausea/vomiting. Please let me know if this is not better in the next few days, I suspect this could be related to a GI bug given the diarrhea you are also having ?-I have sent refills for omeprazole 10m once daily and coreg 6.25 mg twice a day, please continue taking these ?-We will get your liver Ultrasound updated as last one was in 2021 and should be done every 6 months ?-I am also ordering one of your liver labs so that we can make sure all cirrhosis monitoring is up to date ?-Will get you scheduled for upper endoscopy for esophageal varice and swallowing issue evaluation, please continue to take small bites, chew well, take sips of liquids between bites and avoid thicker dryer foods like breads and meats ? ?- Reduce salt intake to <2 g per day ?- Can take Tylenol max of 2 g per day (650 mg q8h) for pain ?- Avoid NSAIDs for pain ?- Avoid eating raw oysters/shellfish ?- Ensure every night before going to sleep ? ?Follow up 6 months ?

## 2021-12-30 NOTE — Telephone Encounter (Signed)
Attempted to reach granddaughter, mail box full ?

## 2021-12-30 NOTE — Telephone Encounter (Signed)
No significant changes on echo, she has mild to moderate heart valve disease that not really changed. Regarding the TIA the discharge summary mentioned following up with an oupt neurologist, they typically would reevaluate the TIA and any potential causes. I don't see that she needs more than routine follow up with cardiology ? ? ?Zandra Abts MD ?

## 2021-12-30 NOTE — Progress Notes (Signed)
? ?Referring Provider: Redmond School, MD ?Primary Care Physician:  Redmond School, MD ?Primary GI Physician: Jenetta Downer ? ?Chief Complaint  ?Patient presents with  ? Follow-up  ?  Patient here today for a follow up. She says she is having some issues with nausea and diarrhea.  ? ?HPI:   ?[name] is a 81 y.o. female with past medical history of NASH cirrhosis, complicated by esophageal varices, s/p banding, CVA, A fib ? ?Patient presenting today for follow up, notably last seen in September 2021. ? ?NASH Cirrhosis: Last seen in September 2021, maintained on coreg 6.75m BID for hx of esophageal varices with last evaluation via EGD in Nov 2021 with grade 1 varices with no stigmata of bleeding +portal hypertensive gastropathy. Last AFP 35.6 in September 2021 with [place] without focal liver lesions. platelet count 119k, AST 25, ALT 17, ALk phos 101, T bili 1.4-3/31/23, INR 1.8 on 12/05/21 ? ?Reports nausea that began about 3 days ago. No vomiting. Having some lower abdominal pain. Is having a little bit of diarrhea that began today. Denies fevers or chills. Denies rectal bleeding or melena. Denies any recent sick contacts or changes in her medications. Appetite has been good, denies any weight loss. She states that she is not taking anything for nausea, is not aware of any precipitating factors. Has BM every 2-3 days at baseline.  ? ?Maintained on omeprazole 426mdaily for hx of esophagitis, Also endorses dysphagia x atleast 1 year, sometimes with liquids, and pretty much with any solid food or pills. Family is having to crush her pills. Grand daughter had to do heimlich maneuver on patient a while back.  ? ?Notably, pt recent admitted to hospital at the end of March for CVA with dizziness, nausea and weakness. Has some L leg weakness as residual, otherwise mental status is WNL. ? ?Previous MELD (March 2023) 14 ? ?Cirrhosis related questions: ?Episodes of confusion/disorientation: none ?Taking diuretics?  Lasix 4069maily ?Beta blockers? Yes coreg 6.27m63mD ?Prior history of variceal banding? yes ?Prior episodes of SBP? no ?Last liver imaging: 06/06/20 ?Alcohol use: no ? ?Last Colonoscopy:>10 years ago, does not wish to repeat colonoscopy  ?Last Endoscopy:07/27/20 - Grade I esophageal varices with no bleeding and no stigmata of recent bleeding. ?- Portal hypertensive gastropathy. ?- Normal examined duodenum. ?- No specimens collected. ? ?Recommendations:  ?Needs repeat EGD ? ?Past Medical History:  ?Diagnosis Date  ? Anemia   ? Aortic insufficiency   ? Moderate  ? Atrial fibrillation (HCC)[place]? Back pain   ? Carotid stenosis, bilateral   ? Cirrhosis (HCC)[place]? Closed left femoral fracture (HCC)[place]/14/2018  ? Coronary artery disease   ? Stent x 2 RCA 1995, cardiac catheterization 09/2016 showing only mild atherosclerosis  ? DDD (degenerative disc disease), lumbar   ? Diabetes mellitus without complication (HCC)[place]? Essential hypertension   ? Falls   ? GERD (gastroesophageal reflux disease)   ? Hiatal hernia   ? History of stroke   ? Hypercholesteremia   ? IBS (irritable bowel syndrome)   ? Non-alcoholic fatty liver disease   ? OSA (obstructive sleep apnea)   ? no CPAP  ? Pericardial effusion   ? a. small by echo 2018.  ? PSVT (paroxysmal supraventricular tachycardia) (HCC)[place]? Recurrent UTI   ? Seizures (HCC)[place]18  ? [facility]  ? several strokes within the last 10 years  ? Swallowing difficulty   ? Varices, esophageal (  [place])   ? ? ?Past Surgical History:  ?Procedure Laterality Date  ? [place]  ? APPENDECTOMY    ? [place]  ? CARDIAC CATHETERIZATION  [phone]  ? Stent to the proximal RCA after MI   ? CARDIAC CATHETERIZATION N/A 09/26/2016  ? Procedure: Left Heart Cath and Coronary Angiography;  Surgeon: Leonie Man, MD;  Location: [facility];  Service: Cardiovascular;  Laterality: N/A;  ? CATARACT EXTRACTION W/PHACO Right 07/04/2014  ? Procedure: CATARACT EXTRACTION  PHACO AND INTRAOCULAR LENS PLACEMENT (IOC);  Surgeon: Elta Guadeloupe T. Gershon Crane, MD;  Location: [facility];  Service: Ophthalmology;  Laterality: Right;  CDE:13.13  ? COLONOSCOPY    ? DILATION AND CURETTAGE OF UTERUS    ? x2  ? Epi Retinal Membrane Peel Left   ? ERCP    ? ESOPHAGEAL BANDING N/A 04/01/2013  ? Procedure: ESOPHAGEAL BANDING;  Surgeon: Rogene Houston, MD;  [facility];  Service: Endoscopy;  Laterality: N/A;  ? ESOPHAGEAL BANDING N/A 05/24/2013  ? Procedure: ESOPHAGEAL BANDING;  Surgeon: Rogene Houston, MD;  [facility];  Service: Endoscopy;  Laterality: N/A;  ? ESOPHAGEAL BANDING N/A 06/21/2014  ? Procedure: ESOPHAGEAL BANDING;  Surgeon: Rogene Houston, MD;  [facility];  Service: Endoscopy;  Laterality: N/A;  ? ESOPHAGEAL BANDING  06/15/2020  ? Procedure: ESOPHAGEAL BANDING;  Surgeon: Montez Morita, Quillian Quince, MD;  [facility];  Service: Gastroenterology;;  ? ESOPHAGOGASTRODUODENOSCOPY N/A 04/01/2013  ? Procedure: ESOPHAGOGASTRODUODENOSCOPY (EGD);  Surgeon: Rogene Houston, MD;  [facility];  Service: Endoscopy;  Laterality: N/A;  230-rescheduled to 8:30am Lelon Frohlich notified pt  ? ESOPHAGOGASTRODUODENOSCOPY N/A 05/24/2013  ? Procedure: ESOPHAGOGASTRODUODENOSCOPY (EGD);  Surgeon: Rogene Houston, MD;  [facility];  Service: Endoscopy;  Laterality: N/A;  730  ? ESOPHAGOGASTRODUODENOSCOPY N/A 06/21/2014  ? Procedure: ESOPHAGOGASTRODUODENOSCOPY (EGD);  Surgeon: Rogene Houston, MD;  [facility];  Service: Endoscopy;  Laterality: N/A;  930-rescheduled 10/14 @ 1200 Ann to notify pt  ? ESOPHAGOGASTRODUODENOSCOPY (EGD) WITH PROPOFOL N/A 06/15/2020  ? Procedure: ESOPHAGOGASTRODUODENOSCOPY (EGD) WITH PROPOFOL;  Surgeon: Harvel Quale, MD;  [facility];  Service: Gastroenterology;  Laterality: N/A;  230  ? ESOPHAGOGASTRODUODENOSCOPY (EGD) WITH PROPOFOL N/A 07/27/2020  ? Procedure: ESOPHAGOGASTRODUODENOSCOPY (EGD) WITH PROPOFOL;   Surgeon: Harvel Quale, MD;  [facility];  Service: Gastroenterology;  Laterality: N/A;  1245  ? EYE SURGERY  08  ? cataract surgery of the left eye  ? FEMUR IM NAIL Left 03/02/2017  ? Procedure: INTRAMEDULLARY (IM) NAIL FEMORAL;  Surgeon: Rod Can, MD;  Location: [facility];  Service: Orthopedics;  Laterality: Left;  ? HARDWARE REMOVAL Right 01/17/2013  ? Procedure: REMOVAL OF HARDWARE AND EXCISION ULNAR STYLOID RIGHT WRIST;  Surgeon: Tennis Must, MD;  Location: [facility];  Service: Orthopedics;  Laterality: Right;  ? implanted heart monitor  02/2020  ? to monitor Afib  ? MYRINGOTOMY  2012  ? both ears  ? ORIF FEMUR FRACTURE Left 04/23/2017  ? Procedure: OPEN REDUCTION INTERNAL FIXATION (ORIF) DISTAL FEMUR FRACTURE (FRACTURE AROUND FEMORAL NAIL);  Surgeon: Altamese [place], MD;  Location: [facility];  Service: Orthopedics;  Laterality: Left;  ? TONSILLECTOMY    ? TUBAL LIGATION    ? VAGINAL HYSTERECTOMY  1972  ? WRIST SURGERY    ? rt wrist hardwear removal  ? ? ?Current Outpatient Medications  ?Medication Sig Dispense Refill  ?  acetaminophen (TYLENOL) 500 MG tablet Take 1,000 mg by mouth every 6 (six) hours as needed for moderate pain.    ? apixaban (ELIQUIS) 5 MG TABS tablet TAKE (1) TABLET BY MOUTH TWICE DAILY. 60 tablet 5  ? atorvastatin (LIPITOR) 80 MG tablet Take 1 tablet (80 mg total) by mouth daily. 30 tablet 1  ? azelastine (ASTELIN) 0.1 % nasal spray Place 1 spray into both nostrils 2 (two) times daily.    ? calcium citrate (CALCITRATE - DOSED IN MG ELEMENTAL CALCIUM) 950 MG tablet Take 1 tablet (200 mg of elemental calcium total) by mouth daily. (Patient taking differently: Take 0.5 tablets by mouth daily.) 30 tablet 1  ? carvedilol (COREG) 6.25 MG tablet TAKE (1) TABLET BY MOUTH TWICE DAILY WITH A MEAL. 180 tablet 0  ? diltiazem (CARDIZEM CD) 180 MG 24 hr capsule Take 1 capsule (180 mg total) by mouth daily. 90 capsule 3  ? furosemide (LASIX) 20 MG tablet Take 20 mg by  mouth daily.    ? furosemide (LASIX) 40 MG tablet TAKE (1) TABLET BY MOUTH ONCE DAILY. MAY TAKE ADDITIONAL HALF IF NEEDED FOR SEVERE SWELLING. 45 tablet 0  ? Multiple Vitamins-Minerals (MULTIVITAMIN

## 2021-12-31 DIAGNOSIS — I4891 Unspecified atrial fibrillation: Secondary | ICD-10-CM | POA: Diagnosis not present

## 2021-12-31 DIAGNOSIS — I119 Hypertensive heart disease without heart failure: Secondary | ICD-10-CM | POA: Diagnosis not present

## 2021-12-31 DIAGNOSIS — I69354 Hemiplegia and hemiparesis following cerebral infarction affecting left non-dominant side: Secondary | ICD-10-CM | POA: Diagnosis not present

## 2021-12-31 DIAGNOSIS — I2510 Atherosclerotic heart disease of native coronary artery without angina pectoris: Secondary | ICD-10-CM | POA: Diagnosis not present

## 2022-01-01 DIAGNOSIS — D696 Thrombocytopenia, unspecified: Secondary | ICD-10-CM | POA: Diagnosis not present

## 2022-01-01 DIAGNOSIS — Z7901 Long term (current) use of anticoagulants: Secondary | ICD-10-CM | POA: Diagnosis not present

## 2022-01-01 DIAGNOSIS — E7800 Pure hypercholesterolemia, unspecified: Secondary | ICD-10-CM | POA: Diagnosis not present

## 2022-01-01 DIAGNOSIS — I119 Hypertensive heart disease without heart failure: Secondary | ICD-10-CM | POA: Diagnosis not present

## 2022-01-01 DIAGNOSIS — K7460 Unspecified cirrhosis of liver: Secondary | ICD-10-CM | POA: Diagnosis not present

## 2022-01-01 DIAGNOSIS — Z9181 History of falling: Secondary | ICD-10-CM | POA: Diagnosis not present

## 2022-01-01 DIAGNOSIS — I4891 Unspecified atrial fibrillation: Secondary | ICD-10-CM | POA: Diagnosis not present

## 2022-01-01 DIAGNOSIS — I700 Atherosclerosis of aorta: Secondary | ICD-10-CM | POA: Diagnosis not present

## 2022-01-01 DIAGNOSIS — G4733 Obstructive sleep apnea (adult) (pediatric): Secondary | ICD-10-CM | POA: Diagnosis not present

## 2022-01-01 DIAGNOSIS — G43909 Migraine, unspecified, not intractable, without status migrainosus: Secondary | ICD-10-CM | POA: Diagnosis not present

## 2022-01-01 DIAGNOSIS — I2510 Atherosclerotic heart disease of native coronary artery without angina pectoris: Secondary | ICD-10-CM | POA: Diagnosis not present

## 2022-01-01 DIAGNOSIS — I69354 Hemiplegia and hemiparesis following cerebral infarction affecting left non-dominant side: Secondary | ICD-10-CM | POA: Diagnosis not present

## 2022-01-01 DIAGNOSIS — I471 Supraventricular tachycardia: Secondary | ICD-10-CM | POA: Diagnosis not present

## 2022-01-01 DIAGNOSIS — K219 Gastro-esophageal reflux disease without esophagitis: Secondary | ICD-10-CM | POA: Diagnosis not present

## 2022-01-01 NOTE — Telephone Encounter (Signed)
Called and spoke to [name] on [place], advised no changes since it is working fine. Appreciative for f/u phone call.  ?

## 2022-01-02 ENCOUNTER — Other Ambulatory Visit (INDEPENDENT_AMBULATORY_CARE_PROVIDER_SITE_OTHER): Payer: Self-pay

## 2022-01-02 ENCOUNTER — Telehealth: Payer: Self-pay | Admitting: *Deleted

## 2022-01-02 ENCOUNTER — Telehealth: Payer: Self-pay | Admitting: Diagnostic Neuroimaging

## 2022-01-02 DIAGNOSIS — K7460 Unspecified cirrhosis of liver: Secondary | ICD-10-CM

## 2022-01-02 DIAGNOSIS — R1310 Dysphagia, unspecified: Secondary | ICD-10-CM

## 2022-01-02 NOTE — Telephone Encounter (Signed)
Called daughter and advised her to call PCP to have patient evaluated for other possible causes in behavior changes such as UTI. She  verbalized understanding, appreciation. ? ?

## 2022-01-02 NOTE — Telephone Encounter (Signed)
Pt's granddaughter called to report that pt's behavior has changed or the negative behavior has increased: very argumentative, and threatening others.  Granddaughter states pt however knows how to conduct herself around doctors to not display such behavior, granddaughter is asking for a call to discuss this concern. She wants to discuss If this is related to stroke. ?

## 2022-01-02 NOTE — Telephone Encounter (Signed)
? ?  Pre-operative Risk Assessment  ?  ?Patient Name: [name]  ?DOB: 23-Oct-1940 ?MRN: [id number]  ? ?  ? ?Request for Surgical Clearance   ? ?Procedure:   UPPER ENDOSCOPY ? ?Date of Surgery:  Clearance 01/28/22                              ?   ?Surgeon:  DR. DANIEL CASTANEDA ?Surgeon's Group or Practice Name:  [facility] ?Phone number:  [phone] ?Fax number:  [phone] ?  ?Type of Clearance Requested:   ?- Medical  ?- Pharmacy:  Hold Apixaban (Eliquis) x 2 DAYS PRIOR ?  ?Type of Anesthesia:  MAC ?  ?Additional requests/questions:   ? ?Signed, ?Julaine Hua   ?01/02/2022, 11:33 AM  ? ?

## 2022-01-03 ENCOUNTER — Other Ambulatory Visit (INDEPENDENT_AMBULATORY_CARE_PROVIDER_SITE_OTHER): Payer: Self-pay

## 2022-01-03 ENCOUNTER — Telehealth (INDEPENDENT_AMBULATORY_CARE_PROVIDER_SITE_OTHER): Payer: Self-pay

## 2022-01-03 ENCOUNTER — Encounter (INDEPENDENT_AMBULATORY_CARE_PROVIDER_SITE_OTHER): Payer: Self-pay

## 2022-01-03 ENCOUNTER — Telehealth: Payer: Self-pay | Admitting: *Deleted

## 2022-01-03 MED ORDER — PEG 3350-KCL-NA BICARB-NACL 420 G PO SOLR: 4000.0000 mL | ORAL | 0 refills | Status: DC

## 2022-01-03 NOTE — Telephone Encounter (Signed)
Please review anticoagulation  ?

## 2022-01-03 NOTE — Telephone Encounter (Signed)
?  Patient Consent for Virtual Visit  ? ? ?   ? ?[name] has provided verbal consent on 01/03/2022 for a virtual visit (video or telephone). ? ? ?CONSENT FOR VIRTUAL VISIT FOR:  [name]  ?By participating in this virtual visit I agree to the following: ? ?I hereby voluntarily request, consent and authorize [facility] and its employed or contracted physicians, physician assistants, nurse practitioners or other licensed health care professionals (the Practitioner), to provide me with telemedicine health care services (the ?Services") as deemed necessary by the treating Practitioner. I acknowledge and consent to receive the Services by the Practitioner via telemedicine. I understand that the telemedicine visit will involve communicating with the Practitioner through live audiovisual communication technology and the disclosure of certain medical information by electronic transmission. I acknowledge that I have been given the opportunity to request an in-person assessment or other available alternative prior to the telemedicine visit and am voluntarily participating in the telemedicine visit. ? ?I understand that I have the right to withhold or withdraw my consent to the use of telemedicine in the course of my care at any time, without affecting my right to future care or treatment, and that the Practitioner or I may terminate the telemedicine visit at any time. I understand that I have the right to inspect all information obtained and/or recorded in the course of the telemedicine visit and may receive copies of available information for a reasonable fee.  I understand that some of the potential risks of receiving the Services via telemedicine include:  ?Delay or interruption in medical evaluation due to technological equipment failure or disruption; ?Information transmitted may not be sufficient (e.g. poor resolution of images) to allow for appropriate medical decision making by the Practitioner; and/or   ?In rare instances, security protocols could fail, causing a breach of personal health information. ? ?Furthermore, I acknowledge that it is my responsibility to provide information about my medical history, conditions and care that is complete and accurate to the best of my ability. I acknowledge that Practitioner's advice, recommendations, and/or decision may be based on factors not within their control, such as incomplete or inaccurate data provided by me or distortions of diagnostic images or specimens that may result from electronic transmissions. I understand that the practice of medicine is not an exact science and that Practitioner makes no warranties or guarantees regarding treatment outcomes. I acknowledge that a copy of this consent can be made available to me via my patient portal ([facility]), or I can request a printed copy by calling the office of [facility].   ? ?I understand that my insurance will be billed for this visit.  ? ?I have read or had this consent read to me. ?I understand the contents of this consent, which adequately explains the benefits and risks of the Services being provided via telemedicine.  ?I have been provided ample opportunity to ask questions regarding this consent and the Services and have had my questions answered to my satisfaction. ?I give my informed consent for the services to be provided through the use of telemedicine in my medical care ? ?  ?

## 2022-01-03 NOTE — Telephone Encounter (Signed)
Lagena Strand Ann Emilie Carp, CMA  ?

## 2022-01-03 NOTE — Telephone Encounter (Signed)
Patient with diagnosis of afib on Eliquis for anticoagulation.   ? ?Procedure: endoscopy ?Date of procedure: 01/28/22 ? ?CHA2DS2-VASc Score = 8  ?This indicates a 10.8% annual risk of stroke. ?The patient's score is based upon: ?CHF History: 0 ?HTN History: 1 ?Diabetes History: 1 ?Stroke History: 2 ?Vascular Disease History: 1 ?Age Score: 2 ?Gender Score: 1 ?  ?Pt with most recent stroke < 1 month ago on 12/05/21, also with hx of prior CVAs in 07/2018 and 12/2019. ? ?CrCl 42m/min ?Platelet count 119K ? ?Per office protocol, recommend that pt only hold Eliquis for 1 day prior to procedure given multiple prior strokes including most recent stroke 1 month ago. She should resume Eliquis as soon as safely possible after.   ?

## 2022-01-03 NOTE — Telephone Encounter (Signed)
Left a message for [name], pt's granddaughter to give office a call back. ?

## 2022-01-03 NOTE — Telephone Encounter (Signed)
? ? ?  Name: [name]  ?DOB: September 16, 1940  ?MRN: [id number] ? ?Primary Cardiologist: Carlyle Dolly, MD ? ? ?Preoperative team, please contact this patient and set up a phone call appointment for further preoperative risk assessment. Please obtain consent and complete medication review. Thank you for your help. ? ?I confirm that guidance regarding antiplatelet and oral anticoagulation therapy has been completed and, if necessary, noted below. ? ? ? ?Christell Faith, PA-C ?01/03/2022, 2:04 PM ?[facility] ?[address] ?[place], [place] [address] ? ? ?

## 2022-01-03 NOTE — Telephone Encounter (Signed)
Spoke with patients grandaughter, [name] Hugh (DPR on file) and scheduled patient for a telehealth pre-op appt on 01/10/22 at 11:20 AM. Consent in and meds reviewed. ?

## 2022-01-05 DIAGNOSIS — E782 Mixed hyperlipidemia: Secondary | ICD-10-CM | POA: Diagnosis not present

## 2022-01-05 DIAGNOSIS — I10 Essential (primary) hypertension: Secondary | ICD-10-CM | POA: Diagnosis not present

## 2022-01-06 ENCOUNTER — Ambulatory Visit (INDEPENDENT_AMBULATORY_CARE_PROVIDER_SITE_OTHER): Payer: Medicare Other

## 2022-01-06 DIAGNOSIS — I639 Cerebral infarction, unspecified: Secondary | ICD-10-CM | POA: Diagnosis not present

## 2022-01-06 LAB — CUP PACEART REMOTE DEVICE CHECK
Date Time Interrogation Session: 20230428230918
Implantable Pulse Generator Implant Date: 20210602

## 2022-01-07 DIAGNOSIS — I4891 Unspecified atrial fibrillation: Secondary | ICD-10-CM | POA: Diagnosis not present

## 2022-01-07 DIAGNOSIS — K7460 Unspecified cirrhosis of liver: Secondary | ICD-10-CM | POA: Diagnosis not present

## 2022-01-07 DIAGNOSIS — I471 Supraventricular tachycardia: Secondary | ICD-10-CM | POA: Diagnosis not present

## 2022-01-07 DIAGNOSIS — K219 Gastro-esophageal reflux disease without esophagitis: Secondary | ICD-10-CM | POA: Diagnosis not present

## 2022-01-07 DIAGNOSIS — G4733 Obstructive sleep apnea (adult) (pediatric): Secondary | ICD-10-CM | POA: Diagnosis not present

## 2022-01-07 DIAGNOSIS — I119 Hypertensive heart disease without heart failure: Secondary | ICD-10-CM | POA: Diagnosis not present

## 2022-01-07 DIAGNOSIS — Z7901 Long term (current) use of anticoagulants: Secondary | ICD-10-CM | POA: Diagnosis not present

## 2022-01-07 DIAGNOSIS — D696 Thrombocytopenia, unspecified: Secondary | ICD-10-CM | POA: Diagnosis not present

## 2022-01-07 DIAGNOSIS — I2510 Atherosclerotic heart disease of native coronary artery without angina pectoris: Secondary | ICD-10-CM | POA: Diagnosis not present

## 2022-01-07 DIAGNOSIS — I69354 Hemiplegia and hemiparesis following cerebral infarction affecting left non-dominant side: Secondary | ICD-10-CM | POA: Diagnosis not present

## 2022-01-07 DIAGNOSIS — G43909 Migraine, unspecified, not intractable, without status migrainosus: Secondary | ICD-10-CM | POA: Diagnosis not present

## 2022-01-07 DIAGNOSIS — I700 Atherosclerosis of aorta: Secondary | ICD-10-CM | POA: Diagnosis not present

## 2022-01-07 DIAGNOSIS — E7800 Pure hypercholesterolemia, unspecified: Secondary | ICD-10-CM | POA: Diagnosis not present

## 2022-01-07 DIAGNOSIS — Z9181 History of falling: Secondary | ICD-10-CM | POA: Diagnosis not present

## 2022-01-08 DIAGNOSIS — I2510 Atherosclerotic heart disease of native coronary artery without angina pectoris: Secondary | ICD-10-CM | POA: Diagnosis not present

## 2022-01-08 DIAGNOSIS — G4733 Obstructive sleep apnea (adult) (pediatric): Secondary | ICD-10-CM | POA: Diagnosis not present

## 2022-01-08 DIAGNOSIS — G43909 Migraine, unspecified, not intractable, without status migrainosus: Secondary | ICD-10-CM | POA: Diagnosis not present

## 2022-01-08 DIAGNOSIS — I700 Atherosclerosis of aorta: Secondary | ICD-10-CM | POA: Diagnosis not present

## 2022-01-08 DIAGNOSIS — I4891 Unspecified atrial fibrillation: Secondary | ICD-10-CM | POA: Diagnosis not present

## 2022-01-08 DIAGNOSIS — K219 Gastro-esophageal reflux disease without esophagitis: Secondary | ICD-10-CM | POA: Diagnosis not present

## 2022-01-08 DIAGNOSIS — E7800 Pure hypercholesterolemia, unspecified: Secondary | ICD-10-CM | POA: Diagnosis not present

## 2022-01-08 DIAGNOSIS — D696 Thrombocytopenia, unspecified: Secondary | ICD-10-CM | POA: Diagnosis not present

## 2022-01-08 DIAGNOSIS — Z9181 History of falling: Secondary | ICD-10-CM | POA: Diagnosis not present

## 2022-01-08 DIAGNOSIS — I471 Supraventricular tachycardia: Secondary | ICD-10-CM | POA: Diagnosis not present

## 2022-01-08 DIAGNOSIS — I69354 Hemiplegia and hemiparesis following cerebral infarction affecting left non-dominant side: Secondary | ICD-10-CM | POA: Diagnosis not present

## 2022-01-08 DIAGNOSIS — K7460 Unspecified cirrhosis of liver: Secondary | ICD-10-CM | POA: Diagnosis not present

## 2022-01-08 DIAGNOSIS — I119 Hypertensive heart disease without heart failure: Secondary | ICD-10-CM | POA: Diagnosis not present

## 2022-01-08 DIAGNOSIS — Z7901 Long term (current) use of anticoagulants: Secondary | ICD-10-CM | POA: Diagnosis not present

## 2022-01-09 NOTE — Telephone Encounter (Signed)
Mychart message sent to pt to call office for Echo results.  ?

## 2022-01-10 ENCOUNTER — Encounter: Payer: Self-pay | Admitting: Student

## 2022-01-10 ENCOUNTER — Ambulatory Visit (INDEPENDENT_AMBULATORY_CARE_PROVIDER_SITE_OTHER): Payer: Medicare Other | Admitting: Student

## 2022-01-10 DIAGNOSIS — Z01810 Encounter for preprocedural cardiovascular examination: Secondary | ICD-10-CM

## 2022-01-10 NOTE — Telephone Encounter (Signed)
Hi Dr. Harl Bowie. I am scheduled to see [name] later this morning for virtual pre-op visit prior to upcoming upper endoscopy. She has a history of CAD with remote stenting to RCA. Last cath in 2018 showed non-obstructive CAD. Also has a history of multiple CVA with recent TIA in 11/2021. Per your last office visit note in 10/2021, it sounds like she is mostly wheelchair bound and can only ambulate short distance with a walker. If she is unable to complete >4.0, would you like her to have a Myoview prior to this low risk procedure of is she OK to proceed as long as she is not having any new angina or shortness of breath. ? ?Please route response back to P CV DIV PREOP. ? ?Thank you! ?[name] ?

## 2022-01-10 NOTE — Progress Notes (Addendum)
? ?Virtual Visit via Telephone Note  ? ?This visit type was conducted due to national recommendations for restrictions regarding the COVID-19 Pandemic (e.g. social distancing) in an effort to limit this patient's exposure and mitigate transmission in our community.  Due to her co-morbid illnesses, this patient is at least at moderate risk for complications without adequate follow up.  This format is felt to be most appropriate for this patient at this time.  The patient did not have access to video technology/had technical difficulties with video requiring transitioning to audio format only (telephone).  All issues noted in this document were discussed and addressed.  No physical exam could be performed with this format.  Please refer to the patient's chart for her  consent to telehealth for [facility]. ?Evaluation Performed:  Preoperative cardiovascular risk assessment ? ?This visit type was conducted due to national recommendations for restrictions regarding the COVID-19 Pandemic (e.g. social distancing).  This format is felt to be most appropriate for this patient at this time.  All issues noted in this document were discussed and addressed.  No physical exam was performed (except for noted visual exam findings with Video Visits).  Please refer to the patient's chart (MyChart message for video visits and phone note for telephone visits) for the patient's consent to telehealth for [facility]. ?_____________  ? ?Date:  01/10/2022  ? ?Patient ID:  [name], DOB Jun 29, 1941, MRN [id number] ?Patient Location:  ?Home ?Provider location:   ?Office ? ?Primary Care Provider:  Redmond School, MD ?Primary Cardiologist:  Carlyle Dolly, MD ? ?Chief Complaint  ?  ?[name] is a 81 y.o. female with a history of CAD with history of MI in 1995 s/p 2 stents to RCA, paroxysmal atrial fibrillation on Eliquis, paroxysmal SVT, prior stroke hypertension, hyperlipidemia, type 2 diabetes mellitus, obstructive sleep  apnea, GERD, IBS, cirrhosis with esophageal varices, and seizure disorder who is pending an upper endoscopy on 01/28/2022 and presents today for telephonic preoperative cardiovascular risk assessment. ? ?Past Medical History  ?  ?Past Medical History:  ?Diagnosis Date  ? Anemia   ? Aortic insufficiency   ? Moderate  ? Atrial fibrillation ([place])   ? Back pain   ? Carotid stenosis, bilateral   ? Cirrhosis ([place])   ? Closed left femoral fracture ([place]) 04/21/2017  ? Coronary artery disease   ? Stent x 2 RCA 1995, cardiac catheterization 09/2016 showing only mild atherosclerosis  ? DDD (degenerative disc disease), lumbar   ? Diabetes mellitus without complication ([place])   ? Essential hypertension   ? Falls   ? GERD (gastroesophageal reflux disease)   ? Hiatal hernia   ? History of stroke   ? Hypercholesteremia   ? IBS (irritable bowel syndrome)   ? Non-alcoholic fatty liver disease   ? OSA (obstructive sleep apnea)   ? no CPAP  ? Pericardial effusion   ? a. small by echo 2018.  ? PSVT (paroxysmal supraventricular tachycardia) ([place])   ? Recurrent UTI   ? Seizures ([place]) 2018  ? Stroke [facility]) 07/2019  ? several strokes within the last 10 years  ? Swallowing difficulty   ? Varices, esophageal ([place])   ? ?Past Surgical History:  ?Procedure Laterality Date  ? [place]  ? APPENDECTOMY    ? [place]  ? CARDIAC CATHETERIZATION  [phone]  ? Stent to the proximal RCA after MI   ? CARDIAC CATHETERIZATION N/A 09/26/2016  ? Procedure: Left Heart Cath  and Coronary Angiography;  Surgeon: Leonie Man, MD;  Location: [facility];  Service: Cardiovascular;  Laterality: N/A;  ? CATARACT EXTRACTION W/PHACO Right 07/04/2014  ? Procedure: CATARACT EXTRACTION PHACO AND INTRAOCULAR LENS PLACEMENT (IOC);  Surgeon: Elta Guadeloupe T. Gershon Crane, MD;  [facility]: [facility];  Service: Ophthalmology;  Laterality: Right;  CDE:13.13  ? COLONOSCOPY    ? DILATION AND CURETTAGE OF UTERUS    ? x2  ? Epi Retinal Membrane Peel Left   ? ERCP     ? ESOPHAGEAL BANDING N/A 04/01/2013  ? Procedure: ESOPHAGEAL BANDING;  Surgeon: Rogene Houston, MD;  [facility];  Service: Endoscopy;  Laterality: N/A;  ? ESOPHAGEAL BANDING N/A 05/24/2013  ? Procedure: ESOPHAGEAL BANDING;  Surgeon: Rogene Houston, MD;  [facility];  Service: Endoscopy;  Laterality: N/A;  ? ESOPHAGEAL BANDING N/A 06/21/2014  ? Procedure: ESOPHAGEAL BANDING;  Surgeon: Rogene Houston, MD;  [facility];  Service: Endoscopy;  Laterality: N/A;  ? ESOPHAGEAL BANDING  06/15/2020  ? Procedure: ESOPHAGEAL BANDING;  Surgeon: Montez Morita, Quillian Quince, MD;  [facility];  Service: Gastroenterology;;  ? ESOPHAGOGASTRODUODENOSCOPY N/A 04/01/2013  ? Procedure: ESOPHAGOGASTRODUODENOSCOPY (EGD);  Surgeon: Rogene Houston, MD;  [facility];  Service: Endoscopy;  Laterality: N/A;  230-rescheduled to 8:30am Lelon Frohlich notified pt  ? ESOPHAGOGASTRODUODENOSCOPY N/A 05/24/2013  ? Procedure: ESOPHAGOGASTRODUODENOSCOPY (EGD);  Surgeon: Rogene Houston, MD;  [facility];  Service: Endoscopy;  Laterality: N/A;  730  ? ESOPHAGOGASTRODUODENOSCOPY N/A 06/21/2014  ? Procedure: ESOPHAGOGASTRODUODENOSCOPY (EGD);  Surgeon: Rogene Houston, MD;  [facility];  Service: Endoscopy;  Laterality: N/A;  930-rescheduled 10/14 @ 1200 Ann to notify pt  ? ESOPHAGOGASTRODUODENOSCOPY (EGD) WITH PROPOFOL N/A 06/15/2020  ? Procedure: ESOPHAGOGASTRODUODENOSCOPY (EGD) WITH PROPOFOL;  Surgeon: Harvel Quale, MD;  [facility];  Service: Gastroenterology;  Laterality: N/A;  230  ? ESOPHAGOGASTRODUODENOSCOPY (EGD) WITH PROPOFOL N/A 07/27/2020  ? Procedure: ESOPHAGOGASTRODUODENOSCOPY (EGD) WITH PROPOFOL;  Surgeon: Harvel Quale, MD;  [facility];  Service: Gastroenterology;  Laterality: N/A;  1245  ? EYE SURGERY  08  ? cataract surgery of the left eye  ? FEMUR IM NAIL Left 03/02/2017  ? Procedure: INTRAMEDULLARY (IM) NAIL FEMORAL;   Surgeon: Rod Can, MD;  Location: [facility];  Service: Orthopedics;  Laterality: Left;  ? HARDWARE REMOVAL Right 01/17/2013  ? Procedure: REMOVAL OF HARDWARE AND EXCISION ULNAR STYLOID RIGHT WRIST;  Surgeon: Tennis Must, MD;  Location: [facility];  Service: Orthopedics;  Laterality: Right;  ? implanted heart monitor  02/2020  ? to monitor Afib  ? MYRINGOTOMY  2012  ? both ears  ? ORIF FEMUR FRACTURE Left 04/23/2017  ? Procedure: OPEN REDUCTION INTERNAL FIXATION (ORIF) DISTAL FEMUR FRACTURE (FRACTURE AROUND FEMORAL NAIL);  Surgeon: Altamese Skyline-Ganipa, MD;  Location: [facility];  Service: Orthopedics;  Laterality: Left;  ? TONSILLECTOMY    ? TUBAL LIGATION    ? VAGINAL HYSTERECTOMY  1972  ? WRIST SURGERY    ? rt wrist hardwear removal  ? ? ?Allergies ? ?Allergies  ?Allergen Reactions  ? Tape Rash and Other (See Comments)  ?  Please use paper tape  ? ? ?History of Present Illness  ?  ?[name] is a 81 y.o. female who presents via Engineer, civil (consulting) for a telehealth visit today.  Patient was last seen in cardiology clinic on 10/18/2021 by Dr. Harl Bowie.  At that time,  [name] was having some ongoing symptoms from her atrial fibrillation and Diltiazem was increased but was otherwise doing well from a cardiac standpoint. She is now pending an upper endoscopy.  Since his last visit, she was admitted for CVA  in 11/2021. Echo at that time showed LVEF of 60-65% with moderate LVH, normal RV, severe biatrial enlargement, mild MR, mild MS, mild to moderate AI, and moderate AS. Dr. Harl Bowie did review hospitalization and stated valvular disease was stable. He did not feel like she needed more than a routine follow-up with cardiology. Today, patient states she is recovering very slowly from her stroke. She states the only real residual deficits is "problems with left left leg" below the knee. She states her leg just does not want to work. She is able to ambulate with a walker across her house but that is  about it. She denies any chest pain or shortness of breath with this. No orthopnea or PND. She has mild left ankle swelling but no other edema. No palpitations, dizziness, or syncope. She is only able t

## 2022-01-13 DIAGNOSIS — G43909 Migraine, unspecified, not intractable, without status migrainosus: Secondary | ICD-10-CM | POA: Diagnosis not present

## 2022-01-13 DIAGNOSIS — I4891 Unspecified atrial fibrillation: Secondary | ICD-10-CM | POA: Diagnosis not present

## 2022-01-13 DIAGNOSIS — I69354 Hemiplegia and hemiparesis following cerebral infarction affecting left non-dominant side: Secondary | ICD-10-CM | POA: Diagnosis not present

## 2022-01-13 DIAGNOSIS — K7460 Unspecified cirrhosis of liver: Secondary | ICD-10-CM | POA: Diagnosis not present

## 2022-01-13 DIAGNOSIS — D696 Thrombocytopenia, unspecified: Secondary | ICD-10-CM | POA: Diagnosis not present

## 2022-01-13 DIAGNOSIS — Z7901 Long term (current) use of anticoagulants: Secondary | ICD-10-CM | POA: Diagnosis not present

## 2022-01-13 DIAGNOSIS — G4733 Obstructive sleep apnea (adult) (pediatric): Secondary | ICD-10-CM | POA: Diagnosis not present

## 2022-01-13 DIAGNOSIS — K219 Gastro-esophageal reflux disease without esophagitis: Secondary | ICD-10-CM | POA: Diagnosis not present

## 2022-01-13 DIAGNOSIS — Z9181 History of falling: Secondary | ICD-10-CM | POA: Diagnosis not present

## 2022-01-13 DIAGNOSIS — I700 Atherosclerosis of aorta: Secondary | ICD-10-CM | POA: Diagnosis not present

## 2022-01-13 DIAGNOSIS — I471 Supraventricular tachycardia: Secondary | ICD-10-CM | POA: Diagnosis not present

## 2022-01-13 DIAGNOSIS — E7800 Pure hypercholesterolemia, unspecified: Secondary | ICD-10-CM | POA: Diagnosis not present

## 2022-01-13 DIAGNOSIS — I119 Hypertensive heart disease without heart failure: Secondary | ICD-10-CM | POA: Diagnosis not present

## 2022-01-13 DIAGNOSIS — I2510 Atherosclerotic heart disease of native coronary artery without angina pectoris: Secondary | ICD-10-CM | POA: Diagnosis not present

## 2022-01-13 NOTE — Progress Notes (Signed)
Ok to proceed with endscopy ? ?Zandra Abts MD ?

## 2022-01-20 ENCOUNTER — Ambulatory Visit (HOSPITAL_COMMUNITY)
Admission: RE | Admit: 2022-01-20 | Discharge: 2022-01-20 | Disposition: A | Discharge status: Home / Self Care | Payer: Medicare Other | Source: Ambulatory Visit | Attending: Gastroenterology | Admitting: Gastroenterology

## 2022-01-20 DIAGNOSIS — K7460 Unspecified cirrhosis of liver: Secondary | ICD-10-CM | POA: Diagnosis not present

## 2022-01-20 DIAGNOSIS — J90 Pleural effusion, not elsewhere classified: Secondary | ICD-10-CM | POA: Diagnosis not present

## 2022-01-20 NOTE — Progress Notes (Signed)
Carelink Summary Report / Loop Recorder 

## 2022-01-22 ENCOUNTER — Other Ambulatory Visit (HOSPITAL_COMMUNITY): Payer: Self-pay | Admitting: Internal Medicine

## 2022-01-22 DIAGNOSIS — I69354 Hemiplegia and hemiparesis following cerebral infarction affecting left non-dominant side: Secondary | ICD-10-CM | POA: Diagnosis not present

## 2022-01-22 DIAGNOSIS — I700 Atherosclerosis of aorta: Secondary | ICD-10-CM | POA: Diagnosis not present

## 2022-01-22 DIAGNOSIS — G4709 Other insomnia: Secondary | ICD-10-CM | POA: Diagnosis not present

## 2022-01-22 DIAGNOSIS — Z6824 Body mass index (BMI) 24.0-24.9, adult: Secondary | ICD-10-CM | POA: Diagnosis not present

## 2022-01-22 DIAGNOSIS — I739 Peripheral vascular disease, unspecified: Secondary | ICD-10-CM | POA: Diagnosis not present

## 2022-01-22 DIAGNOSIS — E1140 Type 2 diabetes mellitus with diabetic neuropathy, unspecified: Secondary | ICD-10-CM | POA: Diagnosis not present

## 2022-01-22 DIAGNOSIS — I6930 Unspecified sequelae of cerebral infarction: Secondary | ICD-10-CM | POA: Diagnosis not present

## 2022-01-22 DIAGNOSIS — I10 Essential (primary) hypertension: Secondary | ICD-10-CM | POA: Diagnosis not present

## 2022-01-22 DIAGNOSIS — I2510 Atherosclerotic heart disease of native coronary artery without angina pectoris: Secondary | ICD-10-CM | POA: Diagnosis not present

## 2022-01-22 DIAGNOSIS — N6320 Unspecified lump in the left breast, unspecified quadrant: Secondary | ICD-10-CM | POA: Diagnosis not present

## 2022-01-22 DIAGNOSIS — I119 Hypertensive heart disease without heart failure: Secondary | ICD-10-CM | POA: Diagnosis not present

## 2022-01-22 DIAGNOSIS — R059 Cough, unspecified: Secondary | ICD-10-CM

## 2022-01-22 NOTE — Patient Instructions (Signed)
? ? ? ? ? ? [facility] ? 01/22/2022  ?  ? @PREFPERIOPPHARMACY @ ? ? Your procedure is scheduled on  01/28/2022. ? ? Report to [facility] at  [place]  A.M. ? ? Call this number if you have problems the morning of surgery: ? [phone] ? ? Remember: ? Follow the diet instructions given to you by the office. ? ?  Your last dose of eliquis should be on 01/25/2022. ? ?  ? Take these medicines the morning of surgery with A SIP OF WATER  ? ?             carvedilol, diltiazem, prilosec,zofran (if needed). ?  ? ? Do not wear jewelry, make-up or nail polish. ? Do not wear lotions, powders, or perfumes, or deodorant. ? Do not shave 48 hours prior to surgery.  Men may shave face and neck. ? Do not bring valuables to the hospital. ? [facility] is not responsible for any belongings or valuables. ? ?Contacts, dentures or bridgework may not be worn into surgery.  Leave your suitcase in the car.  After surgery it may be brought to your room. ? ?For patients admitted to the hospital, discharge time will be determined by your treatment team. ? ?Patients discharged the day of surgery will not be allowed to drive home and must have someone with them for 24 hours.  ? ? ? ?Special instructions:   DO NOT smoke tobacco or vape for 24 hours before your procedure. ? ?Please read over the following fact sheets that you were given. ?Anesthesia Post-op Instructions and Care and Recovery After Surgery ?  ? ? ? Upper Endoscopy, Adult, Care After ?This sheet gives you information about how to care for yourself after your procedure. Your health care provider may also give you more specific instructions. If you have problems or questions, contact your health care provider. ?What can I expect after the procedure? ?After the procedure, it is common to have: ?A sore throat. ?Mild stomach pain or discomfort. ?Bloating. ?Nausea. ?Follow these instructions at home: ? ?Follow instructions from your health care provider about what to eat or drink after your  procedure. ?Return to your normal activities as told by your health care provider. Ask your health care provider what activities are safe for you. ?Take over-the-counter and prescription medicines only as told by your health care provider. ?If you were given a sedative during the procedure, it can affect you for several hours. Do not drive or operate machinery until your health care provider says that it is safe. ?Keep all follow-up visits as told by your health care provider. This is important. ?Contact a health care provider if you have: ?A sore throat that lasts longer than one day. ?Trouble swallowing. ?Get help right away if: ?You vomit blood or your vomit looks like coffee grounds. ?You have: ?A fever. ?Bloody, black, or tarry stools. ?A severe sore throat or you cannot swallow. ?Difficulty breathing. ?Severe pain in your chest or abdomen. ?Summary ?After the procedure, it is common to have a sore throat, mild stomach discomfort, bloating, and nausea. ?If you were given a sedative during the procedure, it can affect you for several hours. Do not drive or operate machinery until your health care provider says that it is safe. ?Follow instructions from your health care provider about what to eat or drink after your procedure. ?Return to your normal activities as told by your health care provider. ?This information is not intended to replace advice  given to you by your health care provider. Make sure you discuss any questions you have with your health care provider. ?Document Revised: 07/01/2019 Document Reviewed: 01/25/2018 ?Elsevier Patient Education ? [place]. ?Monitored Anesthesia Care, Care After ?This sheet gives you information about how to care for yourself after your procedure. Your health care provider may also give you more specific instructions. If you have problems or questions, contact your health care provider. ?What can I expect after the procedure? ?After the procedure, it is common to  have: ?Tiredness. ?Forgetfulness about what happened after the procedure. ?Impaired judgment for important decisions. ?Nausea or vomiting. ?Some difficulty with balance. ?Follow these instructions at home: ?For the time period you were told by your health care provider: ? ?  ? ?Rest as needed. ?Do not participate in activities where you could fall or become injured. ?Do not drive or use machinery. ?Do not drink alcohol. ?Do not take sleeping pills or medicines that cause drowsiness. ?Do not make important decisions or sign legal documents. ?Do not take care of children on your own. ?Eating and drinking ?Follow the diet that is recommended by your health care provider. ?Drink enough fluid to keep your urine pale yellow. ?If you vomit: ?Drink water, juice, or soup when you can drink without vomiting. ?Make sure you have little or no nausea before eating solid foods. ?General instructions ?Have a responsible adult stay with you for the time you are told. It is important to have someone help care for you until you are awake and alert. ?Take over-the-counter and prescription medicines only as told by your health care provider. ?If you have sleep apnea, surgery and certain medicines can increase your risk for breathing problems. Follow instructions from your health care provider about wearing your sleep device: ?Anytime you are sleeping, including during daytime naps. ?While taking prescription pain medicines, sleeping medicines, or medicines that make you drowsy. ?Avoid smoking. ?Keep all follow-up visits as told by your health care provider. This is important. ?Contact a health care provider if: ?You keep feeling nauseous or you keep vomiting. ?You feel light-headed. ?You are still sleepy or having trouble with balance after 24 hours. ?You develop a rash. ?You have a fever. ?You have redness or swelling around the IV site. ?Get help right away if: ?You have trouble breathing. ?You have new-onset confusion at  home. ?Summary ?For several hours after your procedure, you may feel tired. You may also be forgetful and have poor judgment. ?Have a responsible adult stay with you for the time you are told. It is important to have someone help care for you until you are awake and alert. ?Rest as told. Do not drive or operate machinery. Do not drink alcohol or take sleeping pills. ?Get help right away if you have trouble breathing, or if you suddenly become confused. ?This information is not intended to replace advice given to you by your health care provider. Make sure you discuss any questions you have with your health care provider. ?Document Revised: 07/30/2021 Document Reviewed: 07/28/2019 ?Elsevier Patient Education ? [place]. ? ?

## 2022-01-24 ENCOUNTER — Encounter (HOSPITAL_COMMUNITY)
Admission: RE | Admit: 2022-01-24 | Discharge: 2022-01-24 | Disposition: A | Discharge status: Home / Self Care | Payer: Medicare Other | Source: Ambulatory Visit | Attending: Gastroenterology | Admitting: Gastroenterology

## 2022-01-24 ENCOUNTER — Encounter (HOSPITAL_COMMUNITY): Payer: Self-pay

## 2022-01-24 DIAGNOSIS — K7460 Unspecified cirrhosis of liver: Secondary | ICD-10-CM

## 2022-01-27 ENCOUNTER — Telehealth (INDEPENDENT_AMBULATORY_CARE_PROVIDER_SITE_OTHER): Payer: Self-pay | Admitting: *Deleted

## 2022-01-27 NOTE — Telephone Encounter (Signed)
Patient has procedure scheduled for tomorrow ( 5/23) she states she was suppose to do bloodwork last week but was sick. Better now and states she can do today but I was not sure if this needed to be scheduled or if procedure will need to be reschedule now.   [phone]

## 2022-01-27 NOTE — Telephone Encounter (Signed)
The EGD was ordered for follow up esophageal varices, so even though she is feeling better the procedure was not ordered to assess those symptoms but to check if her varices have worsened. Our recommendation is to still proceed with the EGD even if she feels well in order to prevent severe life threatening esophageal hemorrhage. Please ask her to reconsider proceeding with the EGD as she is due for surveillance.

## 2022-01-28 ENCOUNTER — Encounter (HOSPITAL_COMMUNITY): Admission: RE | Payer: Self-pay | Source: Home / Self Care

## 2022-01-28 ENCOUNTER — Ambulatory Visit (HOSPITAL_COMMUNITY): Admission: RE | Admit: 2022-01-28 | Payer: Medicare Other | Source: Home / Self Care | Admitting: Gastroenterology

## 2022-01-28 SURGERY — ESOPHAGOGASTRODUODENOSCOPY (EGD) WITH PROPOFOL
Anesthesia: Monitor Anesthesia Care

## 2022-01-28 NOTE — Telephone Encounter (Signed)
Called and discussed with patient per Dr. Jenetta Downer - The EGD was ordered for follow up esophageal varices, so even though she is feeling better the procedure was not ordered to assess those symptoms but to check if her varices have worsened. Our recommendation is to still proceed with the EGD even if she feels well in order to prevent severe life threatening esophageal hemorrhage. Please ask her to reconsider proceeding with the EGD as she is due for surveillance.  Patient verbalized understanding and states she will call back to reschedule.

## 2022-02-06 ENCOUNTER — Ambulatory Visit (INDEPENDENT_AMBULATORY_CARE_PROVIDER_SITE_OTHER): Payer: Medicare Other

## 2022-02-06 DIAGNOSIS — I639 Cerebral infarction, unspecified: Secondary | ICD-10-CM | POA: Diagnosis not present

## 2022-02-06 LAB — CUP PACEART REMOTE DEVICE CHECK
Date Time Interrogation Session: 20230531232328
Implantable Pulse Generator Implant Date: 20210602

## 2022-02-14 NOTE — Progress Notes (Signed)
Carelink Summary Report / Loop Recorder 

## 2022-02-20 ENCOUNTER — Ambulatory Visit (HOSPITAL_COMMUNITY)
Admission: RE | Admit: 2022-02-20 | Discharge: 2022-02-20 | Disposition: A | Discharge status: Home / Self Care | Payer: Medicare Other | Source: Ambulatory Visit | Attending: Cardiology | Admitting: Cardiology

## 2022-02-20 DIAGNOSIS — I350 Nonrheumatic aortic (valve) stenosis: Secondary | ICD-10-CM | POA: Diagnosis not present

## 2022-02-20 LAB — ECHOCARDIOGRAM COMPLETE
AR max vel: 0.58 cm2
AV Area VTI: 0.6 cm2
AV Area mean vel: 0.63 cm2
AV Mean grad: 24 mmHg
AV Peak grad: 42.8 mmHg
Ao pk vel: 3.27 m/s
Area-P 1/2: 4.06 cm2
MV M vel: 5.73 m/s
MV Peak grad: 131.3 mmHg
P 1/2 time: 406 msec
S' Lateral: 2.7 cm

## 2022-02-20 NOTE — Progress Notes (Signed)
*  PRELIMINARY RESULTS* Echocardiogram 2D Echocardiogram has been performed.  [name] 02/20/2022, 11:47 AM

## 2022-02-21 ENCOUNTER — Other Ambulatory Visit: Payer: Self-pay | Admitting: Cardiology

## 2022-02-21 NOTE — Telephone Encounter (Signed)
Prescription refill request for Eliquis received. Indication: afib  Last office visit: 10/18/2021, Branch Scr: 0.89, 12/06/2021 Age: 81 yo  Weight: 61 kg   Refill sent.

## 2022-03-12 ENCOUNTER — Ambulatory Visit (INDEPENDENT_AMBULATORY_CARE_PROVIDER_SITE_OTHER): Payer: Medicare Other

## 2022-03-12 DIAGNOSIS — R55 Syncope and collapse: Secondary | ICD-10-CM | POA: Diagnosis not present

## 2022-03-13 LAB — CUP PACEART REMOTE DEVICE CHECK
Date Time Interrogation Session: 20230703232048
Implantable Pulse Generator Implant Date: 20210602

## 2022-04-03 NOTE — Progress Notes (Signed)
Carelink Summary Report / Loop Recorder 

## 2022-04-12 ENCOUNTER — Encounter (HOSPITAL_COMMUNITY): Payer: Self-pay | Admitting: *Deleted

## 2022-04-12 ENCOUNTER — Emergency Department (HOSPITAL_COMMUNITY): Payer: Medicare Other

## 2022-04-12 ENCOUNTER — Other Ambulatory Visit: Payer: Self-pay

## 2022-04-12 ENCOUNTER — Inpatient Hospital Stay (HOSPITAL_COMMUNITY)
Admission: EM | Admit: 2022-04-12 | Discharge: 2022-04-14 | DRG: 871 | Disposition: A | Discharge status: Home Health | Payer: Medicare Other | Attending: Internal Medicine | Admitting: Internal Medicine

## 2022-04-12 DIAGNOSIS — K2100 Gastro-esophageal reflux disease with esophagitis, without bleeding: Secondary | ICD-10-CM

## 2022-04-12 DIAGNOSIS — K589 Irritable bowel syndrome without diarrhea: Secondary | ICD-10-CM | POA: Diagnosis present

## 2022-04-12 DIAGNOSIS — I69391 Dysphagia following cerebral infarction: Secondary | ICD-10-CM

## 2022-04-12 DIAGNOSIS — J189 Pneumonia, unspecified organism: Secondary | ICD-10-CM | POA: Diagnosis present

## 2022-04-12 DIAGNOSIS — Z90710 Acquired absence of both cervix and uterus: Secondary | ICD-10-CM

## 2022-04-12 DIAGNOSIS — I69354 Hemiplegia and hemiparesis following cerebral infarction affecting left non-dominant side: Secondary | ICD-10-CM

## 2022-04-12 DIAGNOSIS — J9811 Atelectasis: Secondary | ICD-10-CM | POA: Diagnosis not present

## 2022-04-12 DIAGNOSIS — Z7901 Long term (current) use of anticoagulants: Secondary | ICD-10-CM

## 2022-04-12 DIAGNOSIS — I8510 Secondary esophageal varices without bleeding: Secondary | ICD-10-CM | POA: Diagnosis present

## 2022-04-12 DIAGNOSIS — E876 Hypokalemia: Secondary | ICD-10-CM | POA: Diagnosis not present

## 2022-04-12 DIAGNOSIS — Z87440 Personal history of urinary (tract) infections: Secondary | ICD-10-CM

## 2022-04-12 DIAGNOSIS — Z743 Need for continuous supervision: Secondary | ICD-10-CM | POA: Diagnosis not present

## 2022-04-12 DIAGNOSIS — E119 Type 2 diabetes mellitus without complications: Secondary | ICD-10-CM | POA: Diagnosis present

## 2022-04-12 DIAGNOSIS — I5032 Chronic diastolic (congestive) heart failure: Secondary | ICD-10-CM

## 2022-04-12 DIAGNOSIS — Z961 Presence of intraocular lens: Secondary | ICD-10-CM | POA: Diagnosis present

## 2022-04-12 DIAGNOSIS — R000 Tachycardia, unspecified: Secondary | ICD-10-CM | POA: Diagnosis not present

## 2022-04-12 DIAGNOSIS — R1310 Dysphagia, unspecified: Secondary | ICD-10-CM | POA: Diagnosis not present

## 2022-04-12 DIAGNOSIS — K7689 Other specified diseases of liver: Secondary | ICD-10-CM | POA: Diagnosis not present

## 2022-04-12 DIAGNOSIS — I080 Rheumatic disorders of both mitral and aortic valves: Secondary | ICD-10-CM | POA: Diagnosis not present

## 2022-04-12 DIAGNOSIS — K7460 Unspecified cirrhosis of liver: Secondary | ICD-10-CM | POA: Diagnosis present

## 2022-04-12 DIAGNOSIS — I480 Paroxysmal atrial fibrillation: Secondary | ICD-10-CM | POA: Diagnosis present

## 2022-04-12 DIAGNOSIS — N390 Urinary tract infection, site not specified: Secondary | ICD-10-CM | POA: Diagnosis present

## 2022-04-12 DIAGNOSIS — K6389 Other specified diseases of intestine: Secondary | ICD-10-CM | POA: Diagnosis not present

## 2022-04-12 DIAGNOSIS — K219 Gastro-esophageal reflux disease without esophagitis: Secondary | ICD-10-CM | POA: Diagnosis present

## 2022-04-12 DIAGNOSIS — R6520 Severe sepsis without septic shock: Secondary | ICD-10-CM | POA: Diagnosis not present

## 2022-04-12 DIAGNOSIS — I10 Essential (primary) hypertension: Secondary | ICD-10-CM | POA: Diagnosis present

## 2022-04-12 DIAGNOSIS — I110 Hypertensive heart disease with heart failure: Secondary | ICD-10-CM | POA: Diagnosis not present

## 2022-04-12 DIAGNOSIS — Z8249 Family history of ischemic heart disease and other diseases of the circulatory system: Secondary | ICD-10-CM

## 2022-04-12 DIAGNOSIS — F17210 Nicotine dependence, cigarettes, uncomplicated: Secondary | ICD-10-CM | POA: Diagnosis not present

## 2022-04-12 DIAGNOSIS — Z91048 Other nonmedicinal substance allergy status: Secondary | ICD-10-CM

## 2022-04-12 DIAGNOSIS — E860 Dehydration: Secondary | ICD-10-CM | POA: Diagnosis not present

## 2022-04-12 DIAGNOSIS — E7800 Pure hypercholesterolemia, unspecified: Secondary | ICD-10-CM | POA: Diagnosis not present

## 2022-04-12 DIAGNOSIS — K760 Fatty (change of) liver, not elsewhere classified: Secondary | ICD-10-CM | POA: Diagnosis present

## 2022-04-12 DIAGNOSIS — Z79899 Other long term (current) drug therapy: Secondary | ICD-10-CM

## 2022-04-12 DIAGNOSIS — R6889 Other general symptoms and signs: Secondary | ICD-10-CM | POA: Diagnosis not present

## 2022-04-12 DIAGNOSIS — Z20822 Contact with and (suspected) exposure to covid-19: Secondary | ICD-10-CM | POA: Diagnosis not present

## 2022-04-12 DIAGNOSIS — R0689 Other abnormalities of breathing: Secondary | ICD-10-CM | POA: Diagnosis not present

## 2022-04-12 DIAGNOSIS — E872 Acidosis, unspecified: Secondary | ICD-10-CM | POA: Diagnosis not present

## 2022-04-12 DIAGNOSIS — I2510 Atherosclerotic heart disease of native coronary artery without angina pectoris: Secondary | ICD-10-CM | POA: Diagnosis present

## 2022-04-12 DIAGNOSIS — I639 Cerebral infarction, unspecified: Secondary | ICD-10-CM

## 2022-04-12 DIAGNOSIS — A419 Sepsis, unspecified organism: Secondary | ICD-10-CM | POA: Diagnosis not present

## 2022-04-12 DIAGNOSIS — J90 Pleural effusion, not elsewhere classified: Secondary | ICD-10-CM | POA: Diagnosis not present

## 2022-04-12 DIAGNOSIS — I8500 Esophageal varices without bleeding: Secondary | ICD-10-CM | POA: Diagnosis not present

## 2022-04-12 DIAGNOSIS — R509 Fever, unspecified: Secondary | ICD-10-CM | POA: Diagnosis not present

## 2022-04-12 DIAGNOSIS — N179 Acute kidney failure, unspecified: Secondary | ICD-10-CM | POA: Diagnosis present

## 2022-04-12 DIAGNOSIS — Z833 Family history of diabetes mellitus: Secondary | ICD-10-CM

## 2022-04-12 DIAGNOSIS — I499 Cardiac arrhythmia, unspecified: Secondary | ICD-10-CM | POA: Diagnosis not present

## 2022-04-12 DIAGNOSIS — Z955 Presence of coronary angioplasty implant and graft: Secondary | ICD-10-CM

## 2022-04-12 DIAGNOSIS — G4733 Obstructive sleep apnea (adult) (pediatric): Secondary | ICD-10-CM | POA: Diagnosis present

## 2022-04-12 DIAGNOSIS — K766 Portal hypertension: Secondary | ICD-10-CM | POA: Diagnosis not present

## 2022-04-12 DIAGNOSIS — R55 Syncope and collapse: Secondary | ICD-10-CM | POA: Diagnosis not present

## 2022-04-12 LAB — COMPREHENSIVE METABOLIC PANEL
ALT: 21 U/L (ref 0–44)
AST: 48 U/L — ABNORMAL HIGH (ref 15–41)
Albumin: 3 g/dL — ABNORMAL LOW (ref 3.5–5.0)
Alkaline Phosphatase: 101 U/L (ref 38–126)
Anion gap: 11 (ref 5–15)
BUN: 18 mg/dL (ref 8–23)
CO2: 22 mmol/L (ref 22–32)
Calcium: 8.9 mg/dL (ref 8.9–10.3)
Chloride: 105 mmol/L (ref 98–111)
Creatinine, Ser: 1.46 mg/dL — ABNORMAL HIGH (ref 0.44–1.00)
GFR, Estimated: 36 mL/min — ABNORMAL LOW (ref >60)
Glucose, Bld: 164 mg/dL — ABNORMAL HIGH (ref 70–99)
Potassium: 2.3 mmol/L — CL (ref 3.5–5.1)
Sodium: 138 mmol/L (ref 135–145)
Total Bilirubin: 3.6 mg/dL — ABNORMAL HIGH (ref 0.3–1.2)
Total Protein: 6.1 g/dL — ABNORMAL LOW (ref 6.5–8.1)

## 2022-04-12 LAB — CBC WITH DIFFERENTIAL/PLATELET
Abs Immature Granulocytes: 0.32 10*3/uL — ABNORMAL HIGH (ref 0.00–0.07)
Basophils Absolute: 0.1 10*3/uL (ref 0.0–0.1)
Basophils Relative: 0 %
Eosinophils Absolute: 0 10*3/uL (ref 0.0–0.5)
Eosinophils Relative: 0 %
HCT: 38.3 % (ref 36.0–46.0)
Hemoglobin: 13.2 g/dL (ref 12.0–15.0)
Immature Granulocytes: 1 %
Lymphocytes Relative: 2 %
Lymphs Abs: 0.6 10*3/uL — ABNORMAL LOW (ref 0.7–4.0)
MCH: 31.7 pg (ref 26.0–34.0)
MCHC: 34.5 g/dL (ref 30.0–36.0)
MCV: 91.8 fL (ref 80.0–100.0)
Monocytes Absolute: 0.9 10*3/uL (ref 0.1–1.0)
Monocytes Relative: 3 %
Neutro Abs: 23.6 10*3/uL — ABNORMAL HIGH (ref 1.7–7.7)
Neutrophils Relative %: 94 %
Platelets: 121 10*3/uL — ABNORMAL LOW (ref 150–400)
RBC: 4.17 MIL/uL (ref 3.87–5.11)
RDW: 13.9 % (ref 11.5–15.5)
WBC: 25.4 10*3/uL — ABNORMAL HIGH (ref 4.0–10.5)
nRBC: 0 % (ref 0.0–0.2)

## 2022-04-12 LAB — URINALYSIS, ROUTINE W REFLEX MICROSCOPIC
Bilirubin Urine: NEGATIVE
Glucose, UA: NEGATIVE mg/dL
Ketones, ur: NEGATIVE mg/dL
Leukocytes,Ua: NEGATIVE
Nitrite: NEGATIVE
Protein, ur: 100 mg/dL — AB
RBC / HPF: >50 RBC/hpf — ABNORMAL HIGH (ref 0–5)
Specific Gravity, Urine: 1.015 (ref 1.005–1.030)
pH: 6 (ref 5.0–8.0)

## 2022-04-12 LAB — PHOSPHORUS: Phosphorus: 1.9 mg/dL — ABNORMAL LOW (ref 2.5–4.6)

## 2022-04-12 LAB — SARS CORONAVIRUS 2 BY RT PCR: SARS Coronavirus 2 by RT PCR: NEGATIVE

## 2022-04-12 LAB — LACTIC ACID, PLASMA
Lactic Acid, Venous: 4.8 mmol/L (ref 0.5–1.9)
Lactic Acid, Venous: 4.1 mmol/L (ref 0.5–1.9)

## 2022-04-12 LAB — MAGNESIUM: Magnesium: 1.3 mg/dL — ABNORMAL LOW (ref 1.7–2.4)

## 2022-04-12 LAB — PROTIME-INR
INR: 2.2 — ABNORMAL HIGH (ref 0.8–1.2)
Prothrombin Time: 24.1 seconds — ABNORMAL HIGH (ref 11.4–15.2)

## 2022-04-12 MED ORDER — LACTATED RINGERS IV SOLN: INTRAVENOUS | Status: AC

## 2022-04-12 MED ORDER — VANCOMYCIN HCL 1250 MG/250ML IV SOLN
1250.0000 mg | Freq: Once | INTRAVENOUS | Status: AC
  Administered 2022-04-12: 1250 mg via INTRAVENOUS
  Filled 2022-04-12: qty 250

## 2022-04-12 MED ORDER — VANCOMYCIN HCL 750 MG/150ML IV SOLN
750.0000 mg | INTRAVENOUS | Status: DC
  Administered 2022-04-13 – 2022-04-14 (×2): 750 mg via INTRAVENOUS
  Filled 2022-04-12 (×2): qty 150

## 2022-04-12 MED ORDER — ONDANSETRON HCL 4 MG/2ML IJ SOLN: 4.0000 mg | Freq: Four times a day (QID) | INTRAMUSCULAR | Status: DC | PRN

## 2022-04-12 MED ORDER — ACETAMINOPHEN 500 MG PO TABS: 1000.0000 mg | ORAL_TABLET | Freq: Once | ORAL | Status: DC

## 2022-04-12 MED ORDER — POTASSIUM CHLORIDE 10 MEQ/100ML IV SOLN
10.0000 meq | Freq: Once | INTRAVENOUS | Status: AC
  Administered 2022-04-12: 10 meq via INTRAVENOUS
  Filled 2022-04-12: qty 100

## 2022-04-12 MED ORDER — LACTATED RINGERS IV BOLUS (SEPSIS)
1000.0000 mL | Freq: Once | INTRAVENOUS | Status: AC
  Administered 2022-04-12: 1000 mL via INTRAVENOUS

## 2022-04-12 MED ORDER — POLYVINYL ALCOHOL 1.4 % OP SOLN: 1.0000 [drp] | Freq: Three times a day (TID) | OPHTHALMIC | Status: DC | PRN

## 2022-04-12 MED ORDER — IPRATROPIUM-ALBUTEROL 0.5-2.5 (3) MG/3ML IN SOLN: 3.0000 mL | Freq: Four times a day (QID) | RESPIRATORY_TRACT | Status: DC | PRN

## 2022-04-12 MED ORDER — ACETAMINOPHEN 650 MG RE SUPP: 650.0000 mg | Freq: Four times a day (QID) | RECTAL | Status: DC | PRN

## 2022-04-12 MED ORDER — DM-GUAIFENESIN ER 30-600 MG PO TB12
1.0000 | ORAL_TABLET | Freq: Two times a day (BID) | ORAL | Status: DC
  Administered 2022-04-12 – 2022-04-14 (×4): 1 via ORAL
  Filled 2022-04-12 (×4): qty 1

## 2022-04-12 MED ORDER — ACETAMINOPHEN 325 MG PO TABS: 650.0000 mg | ORAL_TABLET | Freq: Four times a day (QID) | ORAL | Status: DC | PRN

## 2022-04-12 MED ORDER — SODIUM CHLORIDE 0.9 % IV SOLN
2.0000 g | INTRAVENOUS | Status: DC
  Administered 2022-04-12: 2 g via INTRAVENOUS
  Filled 2022-04-12: qty 12.5

## 2022-04-12 MED ORDER — APIXABAN 5 MG PO TABS
5.0000 mg | ORAL_TABLET | Freq: Two times a day (BID) | ORAL | Status: DC
  Administered 2022-04-12 – 2022-04-14 (×4): 5 mg via ORAL
  Filled 2022-04-12 (×4): qty 1

## 2022-04-12 MED ORDER — DILTIAZEM HCL ER COATED BEADS 120 MG PO CP24
120.0000 mg | ORAL_CAPSULE | Freq: Every day | ORAL | Status: DC
  Administered 2022-04-13 – 2022-04-14 (×2): 120 mg via ORAL
  Filled 2022-04-12 (×2): qty 1

## 2022-04-12 MED ORDER — ONDANSETRON HCL 4 MG PO TABS: 4.0000 mg | ORAL_TABLET | Freq: Four times a day (QID) | ORAL | Status: DC | PRN

## 2022-04-12 MED ORDER — POTASSIUM CHLORIDE 10 MEQ/100ML IV SOLN
10.0000 meq | Freq: Once | INTRAVENOUS | Status: AC
Start: 2022-04-12 — End: 2022-04-12
  Administered 2022-04-12: 10 meq via INTRAVENOUS
  Filled 2022-04-12: qty 100

## 2022-04-12 MED ORDER — PANTOPRAZOLE SODIUM 40 MG PO TBEC
40.0000 mg | DELAYED_RELEASE_TABLET | Freq: Every day | ORAL | Status: DC
  Administered 2022-04-12 – 2022-04-14 (×3): 40 mg via ORAL
  Filled 2022-04-12 (×3): qty 1

## 2022-04-12 MED ORDER — IOHEXOL 300 MG/ML  SOLN
75.0000 mL | Freq: Once | INTRAMUSCULAR | Status: AC | PRN
  Administered 2022-04-12: 75 mL via INTRAVENOUS

## 2022-04-12 MED ORDER — ACETAMINOPHEN 650 MG RE SUPP
650.0000 mg | Freq: Once | RECTAL | Status: AC
  Administered 2022-04-12: 650 mg via RECTAL
  Filled 2022-04-12: qty 1

## 2022-04-12 MED ORDER — DOXYLAMINE SUCCINATE (SLEEP) 25 MG PO TABS
25.0000 mg | ORAL_TABLET | Freq: Every day | ORAL | Status: DC
  Filled 2022-04-12 (×2): qty 1

## 2022-04-12 NOTE — Progress Notes (Signed)
Pharmacy Antibiotic Note  [name] is a 81 y.o. female admitted on 04/12/2022 with sepsis.  Pharmacy has been consulted for vancomycin and cefepime dosing.  Plan: Vancomycin 1250 mg IV x 1 dose. Vancomycin 750 mg IV every 24 hours. Cefepime 2000 mg IV every 24 hours. Monitor labs, c/s, and vanco level as indicated.  Height: 5' 3"  (160 cm) Weight: 59 kg (130 lb) IBW/kg (Calculated) : 52.4  Temp (24hrs), Avg:104 F (40 C), Min:104 F (40 C), Max:104 F (40 C)  Recent Labs  Lab 04/12/22 1300  WBC 25.4*  CREATININE 1.46*  LATICACIDVEN 4.8*    Estimated Creatinine Clearance: 25.8 mL/min (A) (by C-G formula based on SCr of 1.46 mg/dL (H)).    Allergies  Allergen Reactions   Tape Rash and Other (See Comments)    Please use paper tape    Antimicrobials this admission: Vanco 8/5 >> Cefepime 8/5 >>  Microbiology results: 8/5 BCx: pending  Thank you for allowing pharmacy to be a part of this patient's care.  Margot Ables, PharmD Clinical Pharmacist 04/12/2022 2:08 PM

## 2022-04-12 NOTE — ED Notes (Signed)
Date and time results received: 04/12/22 1342 (use smartphrase ".now" to insert current time)  Test: CMP  Critical Value: K - 2.3  Name of Provider Notified: Dr. Roderic Palau   Orders Received? Or Actions Taken?: Orders Received - See Orders for details

## 2022-04-12 NOTE — ED Notes (Signed)
Date and time results received: 04/12/22 @ 1324  Test: Lactic Acid  Critical Value: 4.8  Name of Provider Notified: Rutherford Limerick PA  Orders Received? Or Actions Taken?: Orders Received - See Orders for details

## 2022-04-12 NOTE — Assessment & Plan Note (Addendum)
Continue PPI ?

## 2022-04-12 NOTE — ED Provider Notes (Signed)
[facility] EMERGENCY DEPARTMENT Provider Note   CSN: [id number] Arrival date & time: 04/12/22  1232     History Chief Complaint  Patient presents with  . Code Sepsis    [name] is a 81 y.o. female with history of CVA, atrial fibrillation Eliquis, aortic stenosis with some residual left-sided deficits per the family who presents to the emergency department today with lethargy and fever.  Per the daughter at bedside, patient has been little more fussy than normal.  The granddaughter checked on her this morning and she noticed that the patient was staring off into the distance and will less arousable than normal.  Patient had a fever at home.  EMS was called and she was transported to the emergency department.  The granddaughter does mention that the patient has had an increased cough.  No urinary symptoms per the daughter.  HPI     Home Medications Prior to Admission medications   Medication Sig Start Date End Date Taking? Authorizing Provider  acetaminophen (TYLENOL) 500 MG tablet Take 1,000 mg by mouth at bedtime.   Yes [provider]  apixaban (ELIQUIS) 5 MG TABS tablet TAKE (1) TABLET BY MOUTH TWICE DAILY. Patient taking differently: Take 5 mg by mouth 2 (two) times daily. 02/21/22  Yes BranchAlphonse Guild, MD  atorvastatin (LIPITOR) 80 MG tablet Take 1 tablet (80 mg total) by mouth daily. Patient taking differently: Take 80 mg by mouth at bedtime. 07/15/18  Yes Angiulli, Lavon Paganini, PA-C  azelastine (ASTELIN) 0.1 % nasal spray Place 1 spray into both nostrils at bedtime. 11/06/20  Yes [provider]  calcium citrate (CALCITRATE - DOSED IN MG ELEMENTAL CALCIUM) 950 MG tablet Take 1 tablet (200 mg of elemental calcium total) by mouth daily. Patient taking differently: Take 200 mg of elemental calcium by mouth 2 (two) times daily. 07/15/18  Yes Angiulli, Lavon Paganini, PA-C  carvedilol (COREG) 6.25 MG tablet Take 1 tablet (6.25 mg total) by mouth 2 (two) times daily with  a meal. 12/30/21  Yes Carlan, Chelsea L, NP  Coenzyme Q10 (CO Q-10) 100 MG CAPS Take 100 mg by mouth in the morning.   Yes [provider]  diltiazem (DILACOR XR) 120 MG 24 hr capsule Take 120 mg by mouth daily.   Yes [provider]  doxylamine, Sleep, (UNISOM) 25 MG tablet Take 25 mg by mouth at bedtime.   Yes [provider]  furosemide (LASIX) 40 MG tablet TAKE (1) TABLET BY MOUTH ONCE DAILY. MAY TAKE ADDITIONAL HALF IF NEEDED FOR SEVERE SWELLING. Patient taking differently: Take 40-60 mg by mouth See admin instructions. TAKE (1) TABLET BY MOUTH ONCE DAILY. MAY TAKE ADDITIONAL HALF IF NEEDED FOR SEVERE SWELLING. 10/02/21  Yes Branch, Alphonse Guild, MD  HYDROcodone-acetaminophen (NORCO) 10-325 MG tablet Take 1 tablet by mouth at bedtime as needed for severe pain.   Yes [provider]  loperamide (IMODIUM) 2 MG capsule Take 2 mg by mouth as needed for diarrhea or loose stools.   Yes [provider]  Multiple Vitamins-Minerals (MULTIVITAMIN GUMMIES ADULT PO) Take 2 tablets by mouth daily.   Yes [provider]  nitroGLYCERIN (NITROSTAT) 0.4 MG SL tablet Place 0.4 mg under the tongue every 5 (five) minutes as needed for chest pain. 08/22/20  Yes [provider]  omeprazole (PRILOSEC) 20 MG capsule Take 20 mg by mouth in the morning and at bedtime.   Yes [provider]  omeprazole (PRILOSEC) 40 MG capsule Take 1 capsule (  40 mg total) by mouth daily. 12/30/21  Yes Carlan, Chelsea L, NP  ondansetron (ZOFRAN) 4 MG tablet Take 1 tablet (4 mg total) by mouth every 8 (eight) hours as needed for nausea or vomiting. 12/30/21  Yes Carlan, Chelsea L, NP  Polyethyl Glycol-Propyl Glycol (SYSTANE OP) Place 2 drops into both eyes 3 (three) times daily as needed (dry eyes).   Yes [provider]  polyethylene glycol powder (GLYCOLAX/MIRALAX) 17 GM/SCOOP powder Take 8.5 g by mouth daily. 09/18/20  Yes Rehman, Mechele Dawley, MD      Allergies     Tape    Review of Systems   Review of Systems  All other systems reviewed and are negative.   Physical Exam Updated Vital Signs BP (!) 102/55   Pulse (!) 119   Temp 98.4 F (36.9 C) (Oral)   Resp 20   Ht 5' 3"  (1.6 m)   Wt 59 kg   SpO2 96%   BMI 23.03 kg/m  Physical Exam Vitals and nursing note reviewed.  Constitutional:      General: She is in acute distress.     Appearance: Normal appearance.  HENT:     Head: Normocephalic and atraumatic.  Eyes:     General:        Right eye: No discharge.        Left eye: No discharge.  Cardiovascular:     Rate and Rhythm: Tachycardia present.     Pulses: Normal pulses.     Heart sounds: Murmur heard.  Pulmonary:     Comments: Clear to auscultation bilaterally.  Normal effort.  No respiratory distress.  No evidence of wheezes, rales, or rhonchi heard throughout. Abdominal:     General: Abdomen is flat. Bowel sounds are normal. There is no distension.     Tenderness: There is no abdominal tenderness. There is no guarding or rebound.  Musculoskeletal:        General: Normal range of motion.     Cervical back: Neck supple.     Right lower leg: No edema.     Left lower leg: No edema.  Skin:    General: Skin is warm and dry.     Findings: No rash.  Neurological:     General: No focal deficit present.     Mental Status: She is alert.  Psychiatric:        Mood and Affect: Mood normal.        Behavior: Behavior normal.     ED Results / Procedures / Treatments   Labs (all labs ordered are listed, but only abnormal results are displayed) Labs Reviewed  COMPREHENSIVE METABOLIC PANEL - Abnormal; Notable for the following components:      Result Value   Potassium 2.3 (*)    Glucose, Bld 164 (*)    Creatinine, Ser 1.46 (*)    Total Protein 6.1 (*)    Albumin 3.0 (*)    AST 48 (*)    Total Bilirubin 3.6 (*)    GFR, Estimated 36 (*)    All other components within normal limits  LACTIC ACID, PLASMA - Abnormal; Notable for  the following components:   Lactic Acid, Venous 4.8 (*)    All other components within normal limits  CBC WITH DIFFERENTIAL/PLATELET - Abnormal; Notable for the following components:   WBC 25.4 (*)    Platelets 121 (*)    Neutro Abs 23.6 (*)    Lymphs Abs 0.6 (*)    Abs Immature Granulocytes  0.32 (*)    All other components within normal limits  PROTIME-INR - Abnormal; Notable for the following components:   Prothrombin Time 24.1 (*)    INR 2.2 (*)    All other components within normal limits  URINALYSIS, ROUTINE W REFLEX MICROSCOPIC - Abnormal; Notable for the following components:   Hgb urine dipstick LARGE (*)    Protein, ur 100 (*)    RBC / HPF >50 (*)    Bacteria, UA RARE (*)    All other components within normal limits  CULTURE, BLOOD (ROUTINE X 2)  CULTURE, BLOOD (ROUTINE X 2)  SARS CORONAVIRUS 2 BY RT PCR  LACTIC ACID, PLASMA    EKG None  Radiology CT CHEST ABDOMEN PELVIS W CONTRAST  Result Date: 04/12/2022 CLINICAL DATA:  Fever of unknown origin, sepsis EXAM: CT CHEST, ABDOMEN, AND PELVIS WITH CONTRAST TECHNIQUE: Multidetector CT imaging of the chest, abdomen and pelvis was performed following the standard protocol during bolus administration of intravenous contrast. RADIATION DOSE REDUCTION: This exam was performed according to the departmental dose-optimization program which includes automated exposure control, adjustment of the mA and/or kV according to patient size and/or use of iterative reconstruction technique. CONTRAST:  19m OMNIPAQUE IOHEXOL 300 MG/ML  SOLN COMPARISON:  04/12/2022, 05/07/2018 FINDINGS: CT CHEST FINDINGS Cardiovascular: The heart is enlarged, with significant left atrial dilation and marked calcification of the mitral annulus. No significant pericardial effusion. No evidence of thoracic aortic aneurysm or dissection. Marked atherosclerosis of the aorta and coronary vasculature. Mediastinum/Nodes: Thyroid and trachea are unremarkable. Minimal fluid  within the upper thoracic esophagus is nonspecific. No pathologic mediastinal or hilar adenopathy. Esophageal varices again noted at the gastroesophageal junction. Lungs/Pleura: Small right pleural effusion with trace left pleural fluid also noted. Dependent atelectasis within the lower lobes. No airspace disease or pneumothorax. The central airways are patent. Musculoskeletal: No acute or destructive bony lesions. Reconstructed images demonstrate no additional findings. CT ABDOMEN PELVIS FINDINGS Hepatobiliary: Shrunken nodular appearance of the liver consistent with cirrhosis. Indeterminate hyperenhancing region within the left lobe liver image 52/2 measures 4.5 x 2.7 cm. Nonemergent dedicated liver MRI with and without contrast is recommended to exclude underlying hepatocellular carcinoma. No intrahepatic duct dilation. Minimal sludge within the gallbladder. No calcified gallstones or cholecystitis. Pancreas: Unremarkable. No pancreatic ductal dilatation or surrounding inflammatory changes. Spleen: Borderline splenomegaly measuring 12.9 cm in craniocaudal extent. No focal parenchymal abnormality. Adrenals/Urinary Tract: Stable left renal cortical cyst is not require imaging follow-up. Otherwise the kidneys enhance normally. No urinary tract calculi or obstruction. The adrenals and bladder appear grossly unremarkable. Stomach/Bowel: No bowel obstruction or ileus. Moderate retained stool within the distal colon. Scattered diverticulosis of the descending sigmoid colon is noted. There is mild sigmoid colon wall thickening which could reflect underlying colitis or diverticulitis. Vascular/Lymphatic: Diffuse aortic atherosclerosis again noted. Evaluation of the portal vein is somewhat limited due to the timing of contrast bolus, but the portal vein, SMV, and splenic vein appear patent. Splenic, gastric, esophageal varices are noted consistent with portal venous hypertension. Reproductive: Status post hysterectomy. No  adnexal masses. Other: No free fluid or free intraperitoneal gas. No abdominal wall hernia. Musculoskeletal: No acute or destructive bony lesions. Postsurgical changes left hip. Bilateral L5 spondylolysis with grade 2 anterolisthesis of L5 on S1. Reconstructed images demonstrate no additional findings. IMPRESSION: 1. Cirrhosis, with 4.5 cm hyperenhancing region within the dorsal margin left lobe liver as above. When the patient is clinically stable and able to follow directions and hold their breath (preferably as  an outpatient) further evaluation with dedicated abdominal MRI should be considered to exclude underlying hepatocellular carcinoma. 2. Portal venous hypertension, manifested by extensive varices and splenomegaly as above. 3. Mild sigmoid colon wall thickening, which may reflect underlying diverticulitis or inflammatory/infectious colitis. 4. Gallbladder sludge, with no evidence of calcified gallstones or cholecystitis. 5. Small right pleural effusion.  Trace left pleural fluid. 6. Cardiomegaly, with extensive mitral annular calcification. 7.  Aortic Atherosclerosis (ICD10-I70.0). Electronically Signed   By: Randa Ngo M.D.   On: 04/12/2022 15:16   DG Chest Port 1 View  Result Date: 04/12/2022 CLINICAL DATA:  Fever. EXAM: PORTABLE CHEST 1 VIEW COMPARISON:  08/22/2020 FINDINGS: Cardiac silhouette is normal in size. No mediastinal or hilar masses. Clear lungs.  No pleural effusion or pneumothorax. Skeletal structures are demineralized, grossly intact. IMPRESSION: No active disease. Electronically Signed   By: Lajean Manes M.D.   On: 04/12/2022 13:17    Procedures .Critical Care  Performed by: Hendricks Limes, PA-C Authorized by: Hendricks Limes, PA-C   Critical care provider statement:    Critical care time (minutes):  35   Critical care time was exclusive of:  Separately billable procedures and treating other patients   Critical care was necessary to treat or prevent imminent or  life-threatening deterioration of the following conditions:  Sepsis   Critical care was time spent personally by me on the following activities:  Blood draw for specimens, development of treatment plan with patient or surrogate, discussions with consultants, ordering and performing treatments and interventions, ordering and review of laboratory studies, ordering and review of radiographic studies and re-evaluation of patient's condition     Medications Ordered in ED Medications  lactated ringers infusion (has no administration in time range)  potassium chloride 10 mEq in 100 mL IVPB (10 mEq Intravenous New Bag/Given 04/12/22 1458)  ceFEPIme (MAXIPIME) 2 g in sodium chloride 0.9 % 100 mL IVPB (0 g Intravenous Stopped 04/12/22 1439)  vancomycin (VANCOREADY) IVPB 1250 mg/250 mL (1,250 mg Intravenous New Bag/Given 04/12/22 1439)  vancomycin (VANCOREADY) IVPB 750 mg/150 mL (has no administration in time range)  acetaminophen (TYLENOL) suppository 650 mg (650 mg Rectal Given 04/12/22 1311)  lactated ringers bolus 1,000 mL (1,000 mLs Intravenous New Bag/Given 04/12/22 1311)    And  lactated ringers bolus 1,000 mL (1,000 mLs Intravenous New Bag/Given 04/12/22 1307)  potassium chloride 10 mEq in 100 mL IVPB (0 mEq Intravenous Stopped 04/12/22 1457)  iohexol (OMNIPAQUE) 300 MG/ML solution 75 mL (75 mLs Intravenous Contrast Given 04/12/22 1450)    ED Course/ Medical Decision Making/ A&P Clinical Course as of 04/12/22 1531  Sat Apr 12, 2022  1517 CBC with Differential(!) Significant leukocytosis. [CF]  1517 Comprehensive metabolic panel(!!) Hypokalemic.  This will be repleted in the emergency department.  There is also evidence of AKI.  Elevated glucose. [CF]  1517 Culture, blood (Routine x 2) Blood cultures are obtained and pending. [CF]  1517 Urinalysis, Routine w reflex microscopic Urine, In & Out Cath(!) Urinalysis shows hematuria and some evidence of pyuria but no real strong indication for UTI. [CF]  1530  Lactic acid, plasma(!!) Lactic is elevated. [CF]    Clinical Course User Index [CF] Hendricks Limes, PA-C                           Medical Decision Making Dejane Scheibe Bastone is a 81 y.o. female who presents to the emergency department with a fever.  Patient did  meet SIRS criteria on arrival with tachycardia, fever, and tachypnea.  Sepsis protocol was initiated in addition to fluid resuscitation.  I will start the patient on broad-spectrum antibiotics while we get labs to return.  Given the fact that the patient has increased cough I am suspicious for sepsis secondary to pneumonia.  I will plan to reassess.  Blood pressure appears to be normal at this time.  Given the clinical scenario, I do feel the patient would likely benefit from further evaluation in the emergency department today.  Patient is SIRS positive.  Spoke with the hospitalist who recommends getting a CT abdomen pelvis.  I will add on a CT chest. He agrees to admit the patient.   Amount and/or Complexity of Data Reviewed Labs: ordered. Decision-making details documented in ED Course. Radiology: ordered.  Risk OTC drugs. Prescription drug management. Decision regarding hospitalization.   Final Clinical Impression(s) / ED Diagnoses Final diagnoses:  Fever, unspecified fever cause    Rx / DC Orders ED Discharge Orders     None         Cherrie Gauze 04/12/22 1530    Milton Ferguson, MD 04/12/22 1642

## 2022-04-12 NOTE — ED Triage Notes (Signed)
Pt BIB [facility] for code sepsis, temp of 102.8 per report, cbg 188 80/60, co2 14. Rocephin 1 gm given en route. Pt with left sided weakness due to hx of CVA. Pt is from home, family that lives with her.

## 2022-04-12 NOTE — ED Notes (Signed)
Date and time results received: 04/12/22 @ 1552   Test: Lactic Critical Value: 4.1  Name of Provider Notified: Rutherford Limerick PA  Orders Received? Or Actions Taken?:  improving-no new orders at this time

## 2022-04-12 NOTE — H&P (Signed)
History and Physical    Patient: [name] GYF:[id number] DOB: 05/01/1941 DOA: 04/12/2022 DOS: the patient was seen and examined on 04/12/2022 PCP: Redmond School, MD  Patient coming from: Home  Chief Complaint:  Chief Complaint  Patient presents with   Code Sepsis   HPI: [name] is a 81 y.o. female with medical history significant of aortic insufficiency, cirrhosis, hypertension, hyperlipidemia, paroxysmal atrial fibrillation, history of stroke (with residual dysphagia left hemiparesis), gastroesophageal reflux and history of recurring UTIs; presented to the ED with complaints of intermittent coughing spells, fever, general malaise and less interactive from baseline.  Family reports symptom has been slowly progressing over the last 5 to 6 days and worsening in the last 24 hours prior to admission.  No sick contacts, no chest pain, no hematuria, no melena, no hematochezia, no new focal deficits.    There have reports of some abdominal discomfort and nausea, but no vomiting.  Chest x-ray in the ED demonstrating no acute abnormalities; but patient significantly dehydrated, with acute kidney injury, hypokalemia and meeting criteria for severe sepsis.  Cultures taken, fluid resuscitation started and IV antibiotics given.  TRH contacted to place patient in the hospital for further evaluation and management.  Review of Systems: As mentioned in the history of present illness. All other systems reviewed and are negative. Past Medical History:  Diagnosis Date   Anemia    Aortic insufficiency    Moderate   Atrial fibrillation (HCC)    Back pain    Carotid stenosis, bilateral    Cirrhosis ([place])    Closed left femoral fracture (HCC) 04/21/2017   Coronary artery disease    Stent x 2 RCA 1995, cardiac catheterization 09/2016 showing only mild atherosclerosis   DDD (degenerative disc disease), lumbar    Diabetes mellitus without complication (HCC)    Essential hypertension    Falls     GERD (gastroesophageal reflux disease)    Hiatal hernia    History of stroke    Hypercholesteremia    IBS (irritable bowel syndrome)    Non-alcoholic fatty liver disease    OSA (obstructive sleep apnea)    no CPAP   Pericardial effusion    a. small by echo 2018.   PSVT (paroxysmal supraventricular tachycardia) ([place])    Recurrent UTI    Seizures ([place]) 2018   Stroke ([place]) 07/2019   several strokes within the last 10 years   Swallowing difficulty    Varices, esophageal ([place])    Past Surgical History:  Procedure Laterality Date   [place]  [phone]   Stent to the proximal RCA after MI    CARDIAC CATHETERIZATION N/A 09/26/2016   Procedure: Left Heart Cath and Coronary Angiography;  Surgeon: Leonie Man, MD;  Location: [facility];  Service: Cardiovascular;  Laterality: N/A;   CATARACT EXTRACTION W/PHACO Right 07/04/2014   Procedure: CATARACT EXTRACTION PHACO AND INTRAOCULAR LENS PLACEMENT (IOC);  Surgeon: Elta Guadeloupe T. Gershon Crane, MD;  Location: [facility];  Service: Ophthalmology;  Laterality: Right;  CDE:13.13   COLONOSCOPY     DILATION AND CURETTAGE OF UTERUS     x2   Epi Retinal Membrane Peel Left    ERCP     ESOPHAGEAL BANDING N/A 04/01/2013   Procedure: ESOPHAGEAL BANDING;  Surgeon: Rogene Houston, MD;  [facility];  Service: Endoscopy;  Laterality: N/A;   ESOPHAGEAL BANDING N/A  05/24/2013   Procedure: ESOPHAGEAL BANDING;  Surgeon: Rogene Houston, MD;  [facility];  Service: Endoscopy;  Laterality: N/A;   ESOPHAGEAL BANDING N/A 06/21/2014   Procedure: ESOPHAGEAL BANDING;  Surgeon: Rogene Houston, MD;  [facility];  Service: Endoscopy;  Laterality: N/A;   ESOPHAGEAL BANDING  06/15/2020   Procedure: ESOPHAGEAL BANDING;  Surgeon: Harvel Quale, MD;  [facility];  Service: Gastroenterology;;   ESOPHAGOGASTRODUODENOSCOPY N/A 04/01/2013    Procedure: ESOPHAGOGASTRODUODENOSCOPY (EGD);  Surgeon: Rogene Houston, MD;  [facility];  Service: Endoscopy;  Laterality: N/A;  230-rescheduled to 8:30am Ann notified pt   ESOPHAGOGASTRODUODENOSCOPY N/A 05/24/2013   Procedure: ESOPHAGOGASTRODUODENOSCOPY (EGD);  Surgeon: Rogene Houston, MD;  [facility];  Service: Endoscopy;  Laterality: N/A;  730   ESOPHAGOGASTRODUODENOSCOPY N/A 06/21/2014   Procedure: ESOPHAGOGASTRODUODENOSCOPY (EGD);  Surgeon: Rogene Houston, MD;  [facility];  Service: Endoscopy;  Laterality: N/A;  930-rescheduled 10/14 @ 1200 Ann to notify pt   ESOPHAGOGASTRODUODENOSCOPY (EGD) WITH PROPOFOL N/A 06/15/2020   Procedure: ESOPHAGOGASTRODUODENOSCOPY (EGD) WITH PROPOFOL;  Surgeon: Harvel Quale, MD;  [facility];  Service: Gastroenterology;  Laterality: N/A;  230   ESOPHAGOGASTRODUODENOSCOPY (EGD) WITH PROPOFOL N/A 07/27/2020   Procedure: ESOPHAGOGASTRODUODENOSCOPY (EGD) WITH PROPOFOL;  Surgeon: Harvel Quale, MD;  [facility];  Service: Gastroenterology;  Laterality: N/A;  1245   EYE SURGERY  08   cataract surgery of the left eye   FEMUR IM NAIL Left 03/02/2017   Procedure: INTRAMEDULLARY (IM) NAIL FEMORAL;  Surgeon: Rod Can, MD;  Location: [facility];  Service: Orthopedics;  Laterality: Left;   HARDWARE REMOVAL Right 01/17/2013   Procedure: REMOVAL OF HARDWARE AND EXCISION ULNAR STYLOID RIGHT WRIST;  Surgeon: Tennis Must, MD;  Location: [facility];  Service: Orthopedics;  Laterality: Right;   implanted heart monitor  02/2020   to monitor Afib   MYRINGOTOMY  2012   both ears   ORIF FEMUR FRACTURE Left 04/23/2017   Procedure: OPEN REDUCTION INTERNAL FIXATION (ORIF) DISTAL FEMUR FRACTURE (FRACTURE AROUND FEMORAL NAIL);  Surgeon: Altamese Ostrander, MD;  Location: [facility];  Service: Orthopedics;  Laterality: Left;   TONSILLECTOMY     TUBAL LIGATION     VAGINAL HYSTERECTOMY  1972    WRIST SURGERY     rt wrist hardwear removal   Social History:  reports that she has been smoking cigarettes. She has a 12.50 pack-year smoking history. She has never used smokeless tobacco. She reports that she does not drink alcohol and does not use drugs.  Allergies  Allergen Reactions   Tape Rash and Other (See Comments)    Please use paper tape    Family History  Problem Relation Age of Onset   Diabetes Mother    Heart failure Father    Heart failure Maternal Aunt     Prior to Admission medications   Medication Sig Start Date End Date Taking? Authorizing Provider  acetaminophen (TYLENOL) 500 MG tablet Take 1,000 mg by mouth at bedtime.   Yes [provider]  apixaban (ELIQUIS) 5 MG TABS tablet TAKE (1) TABLET BY MOUTH TWICE DAILY. Patient taking differently: Take 5 mg by mouth 2 (two) times daily. 02/21/22  Yes BranchAlphonse Guild, MD  atorvastatin (LIPITOR) 80 MG tablet Take 1 tablet (80 mg total) by mouth daily. Patient taking differently: Take 80 mg by mouth at bedtime. 07/15/18  Yes Angiulli, Lavon Paganini, PA-C  azelastine (ASTELIN) 0.1 % nasal spray Place 1 spray into both nostrils at bedtime. 11/06/20  Yes [provider]  calcium citrate (CALCITRATE - DOSED IN MG ELEMENTAL CALCIUM) 950 MG tablet Take 1 tablet (200 mg of elemental calcium total) by mouth daily. Patient taking differently: Take 200 mg of elemental calcium by mouth 2 (two) times daily. 07/15/18  Yes Angiulli, Lavon Paganini, PA-C  carvedilol (COREG) 6.25 MG tablet Take 1 tablet (6.25 mg total) by mouth 2 (two) times daily with a meal. 12/30/21  Yes Carlan, Chelsea L, NP  Coenzyme Q10 (CO Q-10) 100 MG CAPS Take 100 mg by mouth in the morning.   Yes [provider]  diltiazem (DILACOR XR) 120 MG 24 hr capsule Take 120 mg by mouth daily.   Yes [provider]  doxylamine, Sleep, (UNISOM) 25 MG tablet Take 25 mg by mouth at bedtime.   Yes [provider]  furosemide (LASIX) 40 MG tablet  TAKE (1) TABLET BY MOUTH ONCE DAILY. MAY TAKE ADDITIONAL HALF IF NEEDED FOR SEVERE SWELLING. Patient taking differently: Take 40-60 mg by mouth See admin instructions. TAKE (1) TABLET BY MOUTH ONCE DAILY. MAY TAKE ADDITIONAL HALF IF NEEDED FOR SEVERE SWELLING. 10/02/21  Yes Branch, Alphonse Guild, MD  HYDROcodone-acetaminophen (NORCO) 10-325 MG tablet Take 1 tablet by mouth at bedtime as needed for severe pain.   Yes [provider]  loperamide (IMODIUM) 2 MG capsule Take 2 mg by mouth as needed for diarrhea or loose stools.   Yes [provider]  Multiple Vitamins-Minerals (MULTIVITAMIN GUMMIES ADULT PO) Take 2 tablets by mouth daily.   Yes [provider]  nitroGLYCERIN (NITROSTAT) 0.4 MG SL tablet Place 0.4 mg under the tongue every 5 (five) minutes as needed for chest pain. 08/22/20  Yes [provider]  omeprazole (PRILOSEC) 20 MG capsule Take 20 mg by mouth in the morning and at bedtime.   Yes [provider]  omeprazole (PRILOSEC) 40 MG capsule Take 1 capsule (40 mg total) by mouth daily. 12/30/21  Yes Carlan, Chelsea L, NP  ondansetron (ZOFRAN) 4 MG tablet Take 1 tablet (4 mg total) by mouth every 8 (eight) hours as needed for nausea or vomiting. 12/30/21  Yes Carlan, Chelsea L, NP  Polyethyl Glycol-Propyl Glycol (SYSTANE OP) Place 2 drops into both eyes 3 (three) times daily as needed (dry eyes).   Yes [provider]  polyethylene glycol powder (GLYCOLAX/MIRALAX) 17 GM/SCOOP powder Take 8.5 g by mouth daily. 09/18/20  Yes Rogene Houston, MD    Physical Exam: Vitals:   04/12/22 1730 04/12/22 1745 04/12/22 1800 04/12/22 1837  BP: 115/77 134/78 (!) 140/64   Pulse: 99 (!) 122 99   Resp: (!) 21 (!) 31 (!) 24   Temp:    99.3 F (37.4 C)  TempSrc:    Oral  SpO2: 95% 95% 97%   Weight:      Height:       General exam: Alert, awake, oriented x 3 Respiratory system: Clear to auscultation. Respiratory effort normal. Cardiovascular system:RRR.  No murmurs, rubs, gallops. Gastrointestinal system: Abdomen is nondistended, soft and nontender. No organomegaly or masses felt. Normal bowel sounds heard. Central nervous system: Alert and oriented. No focal neurological deficits. Extremities: No C/C/E, +pedal pulses Skin: No rashes, lesions or ulcers Psychiatry: Judgement and insight appear normal. Mood & affect appropriate.   Data Reviewed: COVID PCR negative Lactic acid 4.8>>> 4.1 (following initial fluid resuscitation) Urinalysis with positive red blood cells, leukocyte esterase  and pyuria. Urine culture and blood culture pending. Comprehensive metabolic panel demonstrating sodium 138, potassium 2.3, chloride 105, bicarb 22, BUN 18, creatinine 1.46 (previously 0.8--0.9 at baseline).  Normal LFTs.   Assessment and Plan: * Severe sepsis with lactic acidosis ([place]) - Patient may criteria for severe sepsis at time of admission with leukocytosis, fever, tachycardia, tachypnea and presumed source of infection UTI/pneumonia.  Acute kidney injury appreciated as organ dysfunction. -Continue fluid resuscitation -Follow culture results -Continue broad-spectrum antibiotics -Follow clinical response and provide antibiotics. -As needed bronchodilators, flutter valve has been ordered.   CVA (cerebral vascular accident) ([place]) - History of stroke -No new focal deficits; patient with residual left hemiparesis and dysphagia to pills. -Continue Eliquis for secondary prevention -Continue risk factor modifications.  Paroxysmal atrial fibrillation (HCC) -continue eliquis  -will resume cardizem  -telemetry monitoring    Dysphagia - As residual from previous stroke -Mechanical soft diet has been ordered -Patient take medications crush in applesauce -Continue supportive care.  Benign essential HTN - Supple pressure at time of admission -Holding antihypertensive agents and diuretics currently -Continue fluid resuscitation -Follow-up vital  signs.  Cirrhosis of liver without ascites (HCC) -Continue outpatient follow-up with GI -CT abdomen and pelvis done while inpatient demonstrating some abnormalities that would benefit for dedicated liver MRI as an outpatient to rule out malignancy. -Will follow LFTs   GERD (gastroesophageal reflux disease) - Continue PPI.  Hypercholesteremia - Planning to resume statins in a.m. -Follow LFTs.  Hypokalemia: -Will replete electrolyte and follow trend -Checking magnesium and phosphorus levels -Telemetry monitoring requested.    Advance Care Planning:   Code Status: Full Code   Consults: none   Family Communication: Daughter at bedside  Severity of Illness: The appropriate patient status for this patient is INPATIENT. Inpatient status is judged to be reasonable and necessary in order to provide the required intensity of service to ensure the patient's safety. The patient's presenting symptoms, physical exam findings, and initial radiographic and laboratory data in the context of their chronic comorbidities is felt to place them at high risk for further clinical deterioration. Furthermore, it is not anticipated that the patient will be medically stable for discharge from the hospital within 2 midnights of admission.   * I certify that at the point of admission it is my clinical judgment that the patient will require inpatient hospital care spanning beyond 2 midnights from the point of admission due to high intensity of service, high risk for further deterioration and high frequency of surveillance required.*  Author: Barton Dubois, MD 04/12/2022 7:05 PM  For on call review [email or web address].

## 2022-04-12 NOTE — Assessment & Plan Note (Addendum)
-  continue eliquis for secondary prevention. -Continue the use of Cardizem and beta-blocker for rate control.

## 2022-04-12 NOTE — Assessment & Plan Note (Addendum)
-  Prior history of stroke; with residual mild dysphagia and left hemiparesis. -No new focal deficits appreciated. -will Continue Eliquis for secondary prevention -Continue risk factor modifications. -Home health PT has been arranged at discharge.

## 2022-04-12 NOTE — Assessment & Plan Note (Addendum)
-  Patient met criteria for severe sepsis at time of admission with leukocytosis, fever, tachycardia, tachypnea and presumed source of infection UTI/pneumonia.  Acute kidney injury appreciated as organ dysfunction. -Patient advised to maintain adequate hydration. -Sepsis features improving/resolved -At discharge will complete antibiotics by mouth using doxycycline and Keflex. -CBC and basic metabolic panel recommended to be rechecked at follow-up visit to assess stability and if needed to replete electrolytes. -Continue supportive care, as needed antipyretics and the use of bronchodilators/mucolytic's.

## 2022-04-12 NOTE — Assessment & Plan Note (Addendum)
-  Continue statins -Continue to follow LFTs.

## 2022-04-12 NOTE — Progress Notes (Signed)
Flutter given to patient.  Patient was able to perform X10 with good effort.  RN informed that flutter was left on computer desk to be given to patient when patient is moved up to floor.

## 2022-04-12 NOTE — Assessment & Plan Note (Addendum)
-  Continue outpatient follow-up with GI -CT abdomen and pelvis done while inpatient demonstrating some abnormalities that would benefit for dedicated liver MRI as an outpatient to rule out malignancy. -Will recommend continue to follow LFTs.

## 2022-04-12 NOTE — Assessment & Plan Note (Addendum)
-  As residual from previous stroke. -Mechanical soft diet with thin liquids has been ordered. -Patient take medications crush in applesauce. -Continue supportive care and assistance. -No new deficits appreciated.

## 2022-04-12 NOTE — Assessment & Plan Note (Addendum)
-  Soft at time of admission but has remained stable since then. -Resume home antihypertensive agents  -Advised to follow heart healthy diet. -Continue to maintain adequate hydration. -Reassess blood pressure at follow-up visit.

## 2022-04-13 DIAGNOSIS — I10 Essential (primary) hypertension: Secondary | ICD-10-CM | POA: Diagnosis not present

## 2022-04-13 DIAGNOSIS — K7460 Unspecified cirrhosis of liver: Secondary | ICD-10-CM | POA: Diagnosis not present

## 2022-04-13 DIAGNOSIS — A419 Sepsis, unspecified organism: Secondary | ICD-10-CM | POA: Diagnosis not present

## 2022-04-13 DIAGNOSIS — I639 Cerebral infarction, unspecified: Secondary | ICD-10-CM | POA: Diagnosis not present

## 2022-04-13 LAB — BASIC METABOLIC PANEL
Anion gap: 6 (ref 5–15)
BUN: 21 mg/dL (ref 8–23)
CO2: 25 mmol/L (ref 22–32)
Calcium: 8.5 mg/dL — ABNORMAL LOW (ref 8.9–10.3)
Chloride: 109 mmol/L (ref 98–111)
Creatinine, Ser: 1.14 mg/dL — ABNORMAL HIGH (ref 0.44–1.00)
GFR, Estimated: 49 mL/min — ABNORMAL LOW (ref >60)
Glucose, Bld: 133 mg/dL — ABNORMAL HIGH (ref 70–99)
Potassium: 2.8 mmol/L — ABNORMAL LOW (ref 3.5–5.1)
Sodium: 140 mmol/L (ref 135–145)

## 2022-04-13 LAB — CBC
HCT: 32.3 % — ABNORMAL LOW (ref 36.0–46.0)
Hemoglobin: 11.2 g/dL — ABNORMAL LOW (ref 12.0–15.0)
MCH: 32.5 pg (ref 26.0–34.0)
MCHC: 34.7 g/dL (ref 30.0–36.0)
MCV: 93.6 fL (ref 80.0–100.0)
Platelets: 86 10*3/uL — ABNORMAL LOW (ref 150–400)
RBC: 3.45 MIL/uL — ABNORMAL LOW (ref 3.87–5.11)
RDW: 14.4 % (ref 11.5–15.5)
WBC: 16.4 10*3/uL — ABNORMAL HIGH (ref 4.0–10.5)
nRBC: 0 % (ref 0.0–0.2)

## 2022-04-13 LAB — LACTIC ACID, PLASMA: Lactic Acid, Venous: 2.5 mmol/L (ref 0.5–1.9)

## 2022-04-13 MED ORDER — CHLORHEXIDINE GLUCONATE CLOTH 2 % EX PADS
6.0000 | MEDICATED_PAD | Freq: Every day | CUTANEOUS | Status: DC
  Administered 2022-04-13 – 2022-04-14 (×2): 6 via TOPICAL

## 2022-04-13 MED ORDER — POTASSIUM CHLORIDE CRYS ER 20 MEQ PO TBCR
40.0000 meq | EXTENDED_RELEASE_TABLET | ORAL | Status: AC
  Administered 2022-04-13 (×3): 40 meq via ORAL
  Filled 2022-04-13 (×3): qty 2

## 2022-04-13 MED ORDER — ATORVASTATIN CALCIUM 40 MG PO TABS
40.0000 mg | ORAL_TABLET | Freq: Every day | ORAL | Status: DC
  Administered 2022-04-13 – 2022-04-14 (×2): 40 mg via ORAL
  Filled 2022-04-13 (×2): qty 1

## 2022-04-13 MED ORDER — SODIUM CHLORIDE 0.9 % IV SOLN
2.0000 g | Freq: Two times a day (BID) | INTRAVENOUS | Status: DC
  Administered 2022-04-13 – 2022-04-14 (×3): 2 g via INTRAVENOUS
  Filled 2022-04-13 (×3): qty 12.5

## 2022-04-13 MED ORDER — CARVEDILOL 3.125 MG PO TABS
6.2500 mg | ORAL_TABLET | Freq: Two times a day (BID) | ORAL | Status: DC
  Administered 2022-04-13 – 2022-04-14 (×2): 6.25 mg via ORAL
  Filled 2022-04-13 (×3): qty 2

## 2022-04-13 MED ORDER — LACTATED RINGERS IV SOLN: INTRAVENOUS | Status: AC

## 2022-04-13 MED ORDER — MAGNESIUM SULFATE 2 GM/50ML IV SOLN
2.0000 g | Freq: Once | INTRAVENOUS | Status: AC
  Administered 2022-04-13: 2 g via INTRAVENOUS
  Filled 2022-04-13: qty 50

## 2022-04-13 NOTE — Progress Notes (Addendum)
Progress Note   Patient: [name] ZOX:[id number] DOB: October 07, 1940 DOA: 04/12/2022     1 DOS: the patient was seen and examined on 04/13/2022   Brief hospital admission course: Kalyna Paolella Biffle is a 81 y.o. female with medical history significant of aortic insufficiency, cirrhosis, hypertension, hyperlipidemia, paroxysmal atrial fibrillation, history of stroke (with residual dysphagia left hemiparesis), gastroesophageal reflux and history of recurring UTIs; presented to the ED with complaints of intermittent coughing spells, fever, general malaise and less interactive from baseline.  Family reports symptom has been slowly progressing over the last 5 to 6 days and worsening in the last 24 hours prior to admission.  No sick contacts, no chest pain, no hematuria, no melena, no hematochezia, no new focal deficits.    There have reports of some abdominal discomfort and nausea, but no vomiting.   Chest x-ray in the ED demonstrating no acute abnormalities; but patient significantly dehydrated, with acute kidney injury, hypokalemia and meeting criteria for severe sepsis.  Cultures taken, fluid resuscitation started and IV antibiotics given.  TRH contacted to place patient in the hospital for further evaluation and management.    Assessment and Plan: * Severe sepsis with lactic acidosis ([place]) -Patient met criteria for severe sepsis at time of admission with leukocytosis, fever, tachycardia, tachypnea and presumed source of infection UTI/pneumonia.  Acute kidney injury appreciated as organ dysfunction. -Continue to maintain adequate hydration -Sepsis features improving/resolving -Plan WBCs has trended down, patient is not febrile and her renal function has also improved. -Continue current antibiotics, follow renal function and electrolytes -Patient has lactic acidosis at time of admission up to 4.1; currently down to 2.5. -Continue as needed bronchodilators and mucolytic's; follow clinical  response.   CVA (cerebral vascular accident) ([place]) -Prior history of stroke; with residual mild dysphagia and left hemiparesis. -No new focal deficits -will Continue Eliquis for secondary prevention -Continue risk factor modifications.  Paroxysmal atrial fibrillation (HCC) -continue eliquis for secondary prevention. -Continue the use of Cardizem and beta-blocker for rate control.   Dysphagia -As residual from previous stroke -Mechanical soft diet has been ordered -Patient take medications crush in applesauce -Continue supportive care and assistance. -No new deficits appreciated.  Benign essential HTN -Soft at time of admission but is stable. -Resume home antihypertensive agents  -Follow heart healthy diet. -Continue fluid resuscitation -Follow-up vital signs.  Cirrhosis of liver without ascites (HCC) -Continue outpatient follow-up with GI -CT abdomen and pelvis done while inpatient demonstrating some abnormalities that would benefit for dedicated liver MRI as an outpatient to rule out malignancy. -Will continue to follow LFTs   GERD (gastroesophageal reflux disease) -Continue PPI.  Hypercholesteremia -Continue statins -Continue to follow LFTs.  Hypokalemia and hypomagnesemia -Electrolytes will be repleted -Follow trend -Continue telemetry monitoring.  Subjective:  Complaining of intermittent nonproductive coughing spells and frequency; currently afebrile.  Denies chest pain, abdominal pain, nausea or vomiting.  Physical Exam: Vitals:   04/13/22 0600 04/13/22 0826 04/13/22 0900 04/13/22 0940  BP: (!) 121/51 (!) 124/47 (!) 122/49   Pulse: 89 (!) 118 (!) 103   Resp: (!) 25 (!) 24 (!) 24   Temp:    98.9 F (37.2 C)  TempSrc:    Axillary  SpO2: 99% 99% 99%   Weight:      Height:       General exam: Alert, awake, oriented x 2; reports breathing easier and feeling better.  Still experiencing intermittent nonproductive coughing spells.  Patient also expresses  frequency but denies dysuria.  Afebrile at  time of evaluation. Respiratory system: Positive rhonchi, no using accessory muscles.  No wheezing on exam. Cardiovascular system: Irregular, no rubs or gallops; positive murmur.  No JVD on exam. Gastrointestinal system: Abdomen is nondistended, soft and nontender. No organomegaly or masses felt. Normal bowel sounds heard. Central nervous system: No new focal neurological deficits. Extremities: No no cyanosis or clubbing. Skin: No petechiae. Psychiatry: Mood & affect appropriate.   Data Reviewed: Lactic acid 2.5 CBC: WBC 16.4, hemoglobin 11.2 and platelet count 86 K Basic metabolic panel: Sodium 263, potassium 2.8, chloride 109, bicarb 25, BUN 21, creatinine 1.14 MRSA PCR: Collected and pending.  Family Communication: No family at bedside.  Disposition: Status is: Inpatient Remains inpatient appropriate because: Still requiring IV antibiotics to manage sepsis due to pneumonia/UTI.   Planned Discharge Destination: Home with Home Health   Author: Barton Dubois, MD 04/13/2022 11:15 AM  For on call review [email or web address].

## 2022-04-13 NOTE — ED Notes (Signed)
Pt has been changed and cleaned up after having a BM.

## 2022-04-13 NOTE — TOC Initial Note (Signed)
Transition of Care [facility]) - Initial/Assessment Note    Patient Details  Name: [name] MRN: [id number] Date of Birth: 19-May-1941  Transition [facility]) CM/SW Contact:    Iona Beard, [place] Phone Number: 04/13/2022, 10:50 AM  Clinical Narrative:                 Pt is high risk for readmission. CSW spoke with pts son Carley Glendenning to complete assessment. Pts son lives across the street. Pt has two granddaughter's that are RN's that live in the home with her. They are able to assist with ADLs as needed. Pt has transportation provided through family when needed. Pt has had HH in the past, family states a company used was [employer]. Pt has a wheelchair to use when needed. TOC to follow.   Expected Discharge Plan: [place] Barriers to Discharge: Continued Medical Work up   Patient Goals and CMS Choice Patient states their goals for this hospitalization and ongoing recovery are:: return home CMS Medicare.gov Compare Post Acute Care list provided to:: Patient Represenative (must comment) Choice offered to / list presented to : Patient, Adult Children  Expected Discharge Plan and Services Expected Discharge Plan: [place] In-house Referral: Clinical Social Work Discharge Planning Services: CM Consult Post Acute Care Choice: [place] arrangements for the past 2 months: Single Family Home                                      Prior Living Arrangements/Services Living arrangements for the past 2 months: Single Family Home Lives with:: Relatives Patient language and need for interpreter reviewed:: Yes Do you feel safe going back to the place where you live?: Yes      Need for Family Participation in Patient Care: Yes (Comment) Care giver support system in place?: Yes (comment) Current home services: DME Criminal Activity/Legal Involvement Pertinent to Current Situation/Hospitalization: No - Comment as needed  Activities of  Daily Living      Permission Sought/Granted                  Emotional Assessment Appearance:: Appears stated age       Alcohol / Substance Use: Not Applicable Psych Involvement: No (comment)  Admission diagnosis:  Fever, unspecified fever cause [R50.9] Severe sepsis with lactic acidosis ([place]) [A41.9, R65.20, E87.20] Patient Active Problem List   Diagnosis Date Noted   Severe sepsis with lactic acidosis ([place]) 04/12/2022   CVA (cerebral vascular accident) ([place]) 12/06/2021   NASH (nonalcoholic steatohepatitis) 06/05/2020   History of esophageal varices 06/05/2020   Persistent atrial fibrillation ([place]) 03/09/2020   Paroxysmal atrial fibrillation ([place]) 02/24/2020   Secondary hypercoagulable state ([place]) 02/24/2020   Acute CVA (cerebrovascular accident) ([place]) 01/03/2020   Tobacco use disorder 07/09/2018   Small vessel disease ([place]) 07/09/2018   Benign essential HTN    Seizures ([place])    Dysphagia    Dysarthria 05/19/2018   Syncope and collapse    Closed left femoral fracture ([place]) 04/21/2017   Seizure disorder ([place]) 04/21/2017   Closed pertrochanteric fracture of femur, left, initial encounter ([place]) 03/02/2017   Seizure ([place]) 02/28/2017   Hip fracture ([place]) 02/28/2017   Diabetes mellitus without complication ([place]) 21/19/4174   Epiretinal membrane (ERM) of left eye 01/20/2017   Pseudophakia 01/20/2017   NSTEMI (non-ST elevated myocardial infarction) ([place]) 11/02/2016   Elevated troponin 09/24/2016  Hypokalemia 09/23/2016   TIA (transient ischemic attack) 05/05/2016   Nausea and vomiting 10/15/2015   Acute coronary syndrome ([place]) 06/01/2015   Thrombocytopenia ([place]) 06/01/2015   Cerebral infarction ([place]) right occipital due to small vessel disease s/p tPA 03/06/2014   PSVT (paroxysmal supraventricular tachycardia) ([place]) 12/09/2013   Esophageal varices ([place]) 03/29/2013   Hematemesis 03/29/2013   Cirrhosis of liver without ascites ([place]) 03/29/2013   Presence of stent in  right coronary artery 07/04/2011   OSA (obstructive sleep apnea) 07/04/2011   Aortic sclerosis 07/04/2011   Aortic insufficiency 07/04/2011   HTN (hypertension) 07/04/2011   Carotid stenosis, bilateral 07/04/2011   CAD (coronary artery disease) 07/03/2011   Hypercholesteremia 07/03/2011   GERD (gastroesophageal reflux disease) 07/03/2011   PCP:  Redmond School, MD Pharmacy:   [facility] [place] [address] [place] [address] Phone: [phone] Fax: [phone]     Social Determinants of Health (SDOH) Interventions    Readmission Risk Interventions    04/13/2022   10:50 AM  Readmission Risk Prevention Plan  Transportation Screening Complete  HRI or Home Care Consult Complete  Social Work Consult for [place] Planning/Counseling Complete  Palliative Care Screening Not Applicable  Medication Review Press photographer) Complete

## 2022-04-14 ENCOUNTER — Ambulatory Visit (INDEPENDENT_AMBULATORY_CARE_PROVIDER_SITE_OTHER): Payer: Medicare Other

## 2022-04-14 DIAGNOSIS — R1310 Dysphagia, unspecified: Secondary | ICD-10-CM | POA: Diagnosis not present

## 2022-04-14 DIAGNOSIS — I10 Essential (primary) hypertension: Secondary | ICD-10-CM | POA: Diagnosis not present

## 2022-04-14 DIAGNOSIS — R55 Syncope and collapse: Secondary | ICD-10-CM

## 2022-04-14 DIAGNOSIS — N390 Urinary tract infection, site not specified: Secondary | ICD-10-CM

## 2022-04-14 DIAGNOSIS — J189 Pneumonia, unspecified organism: Secondary | ICD-10-CM

## 2022-04-14 DIAGNOSIS — I5032 Chronic diastolic (congestive) heart failure: Secondary | ICD-10-CM

## 2022-04-14 DIAGNOSIS — K7460 Unspecified cirrhosis of liver: Secondary | ICD-10-CM | POA: Diagnosis not present

## 2022-04-14 DIAGNOSIS — A419 Sepsis, unspecified organism: Secondary | ICD-10-CM | POA: Diagnosis not present

## 2022-04-14 LAB — COMPREHENSIVE METABOLIC PANEL
ALT: 28 U/L (ref 0–44)
AST: 75 U/L — ABNORMAL HIGH (ref 15–41)
Albumin: 2.3 g/dL — ABNORMAL LOW (ref 3.5–5.0)
Alkaline Phosphatase: 74 U/L (ref 38–126)
Anion gap: 4 — ABNORMAL LOW (ref 5–15)
BUN: 22 mg/dL (ref 8–23)
CO2: 22 mmol/L (ref 22–32)
Calcium: 8.3 mg/dL — ABNORMAL LOW (ref 8.9–10.3)
Chloride: 112 mmol/L — ABNORMAL HIGH (ref 98–111)
Creatinine, Ser: 0.98 mg/dL (ref 0.44–1.00)
GFR, Estimated: 59 mL/min — ABNORMAL LOW (ref >60)
Glucose, Bld: 113 mg/dL — ABNORMAL HIGH (ref 70–99)
Potassium: 3.9 mmol/L (ref 3.5–5.1)
Sodium: 138 mmol/L (ref 135–145)
Total Bilirubin: 1.7 mg/dL — ABNORMAL HIGH (ref 0.3–1.2)
Total Protein: 5.2 g/dL — ABNORMAL LOW (ref 6.5–8.1)

## 2022-04-14 LAB — CUP PACEART REMOTE DEVICE CHECK
Date Time Interrogation Session: 20230805232353
Implantable Pulse Generator Implant Date: 20210602

## 2022-04-14 LAB — LACTIC ACID, PLASMA: Lactic Acid, Venous: 1.3 mmol/L (ref 0.5–1.9)

## 2022-04-14 LAB — CBC
HCT: 32.4 % — ABNORMAL LOW (ref 36.0–46.0)
Hemoglobin: 10.9 g/dL — ABNORMAL LOW (ref 12.0–15.0)
MCH: 31.9 pg (ref 26.0–34.0)
MCHC: 33.6 g/dL (ref 30.0–36.0)
MCV: 94.7 fL (ref 80.0–100.0)
Platelets: 85 10*3/uL — ABNORMAL LOW (ref 150–400)
RBC: 3.42 MIL/uL — ABNORMAL LOW (ref 3.87–5.11)
RDW: 14.6 % (ref 11.5–15.5)
WBC: 10.9 10*3/uL — ABNORMAL HIGH (ref 4.0–10.5)
nRBC: 0 % (ref 0.0–0.2)

## 2022-04-14 LAB — GLUCOSE, CAPILLARY: Glucose-Capillary: 119 mg/dL — ABNORMAL HIGH (ref 70–99)

## 2022-04-14 MED ORDER — DOXYCYCLINE HYCLATE 100 MG PO TABS
100.0000 mg | ORAL_TABLET | Freq: Two times a day (BID) | ORAL | 0 refills | Status: AC
Start: 2022-04-14 — End: 2022-04-19

## 2022-04-14 MED ORDER — CEPHALEXIN 500 MG PO CAPS
500.0000 mg | ORAL_CAPSULE | Freq: Two times a day (BID) | ORAL | 0 refills | Status: AC
Start: 2022-04-14 — End: 2022-04-19

## 2022-04-14 MED ORDER — DOXYLAMINE SUCCINATE (SLEEP) 25 MG PO TABS: 25.0000 mg | ORAL_TABLET | Freq: Every evening | ORAL | Status: DC | PRN

## 2022-04-14 MED ORDER — FUROSEMIDE 40 MG PO TABS: 20.0000 mg | ORAL_TABLET | Freq: Every day | ORAL | 0 refills | Status: DC

## 2022-04-14 NOTE — Assessment & Plan Note (Signed)
-   Continue the use of beta-blocker and adjusted dose of Lasix -Heart healthy/low-sodium diet discussed with patient -Advised to maintain adequate hydration And instructed to check weight on daily basis.

## 2022-04-14 NOTE — Plan of Care (Signed)
  Problem: Acute Rehab PT Goals(only PT should resolve) Goal: Pt Will Go Supine/Side To Sit Outcome: Progressing Flowsheets (Taken 04/14/2022 1457) Pt will go Supine/Side to Sit:  with min guard assist  with minimal assist Goal: Patient Will Transfer Sit To/From Stand Outcome: Progressing Flowsheets (Taken 04/14/2022 1457) Patient will transfer sit to/from stand:  with min guard assist  with minimal assist Goal: Pt Will Transfer Bed To Chair/Chair To Bed Outcome: Progressing Flowsheets (Taken 04/14/2022 1457) Pt will Transfer Bed to Chair/Chair to Bed: with min assist Goal: Pt Will Ambulate Outcome: Progressing Flowsheets (Taken 04/14/2022 1457) Pt will Ambulate:  10 feet  with moderate assist  with rolling walker   2:57 PM, 04/14/22 Lonell Grandchild, MPT Physical Therapist with [facility] [phone] office [phone] mobile phone

## 2022-04-14 NOTE — Assessment & Plan Note (Signed)
-   Good saturation on room air; having some intermittent coughing spells but no further fever and feeling ready to go home. -Continue antibiotics as instructed -Repeat chest x-ray in 6 weeks.

## 2022-04-14 NOTE — Discharge Summary (Signed)
Physician Discharge Summary   Patient: [name] MRN: [id number] DOB: [birth year]  Admit date:     04/12/2022  Discharge date: 04/14/22  Discharge Physician: Barton Dubois   PCP: Redmond School, MD   Recommendations at discharge:  Repeat basic metabolic panel to follow electrolytes and renal function Repeat chest x-ray in 6 weeks to assure complete resolution of infiltrates Follow final results of her urine culture with further adjustment to antibiotic regimen if required. Repeat CBC to follow hemoglobin trend/stability and resolution of leukocytosis Outpatient hepatic MRI as recommended by radiology service due to findings in her CT abdomen (see below for details)  Discharge Diagnoses: Principal Problem:   Sepsis secondary to UTI [facility]) Active Problems:   CVA (cerebral vascular accident) ([place])   Hypercholesteremia   GERD (gastroesophageal reflux disease)   Cirrhosis of liver without ascites ([place])   Benign essential HTN   Dysphagia   Paroxysmal atrial fibrillation ([place])   Community acquired pneumonia   Chronic diastolic HF (heart failure) [facility])  Hospital Course: [name] is a 81 y.o. female with medical history significant of aortic insufficiency, cirrhosis, hypertension, hyperlipidemia, paroxysmal atrial fibrillation, history of stroke (with residual dysphagia left hemiparesis), gastroesophageal reflux and history of recurring UTIs; presented to the ED with complaints of intermittent coughing spells, fever, general malaise and less interactive from baseline.  Family reports symptom has been slowly progressing over the last 5 to 6 days and worsening in the last 24 hours prior to admission.  No sick contacts, no chest pain, no hematuria, no melena, no hematochezia, no new focal deficits.    There have reports of some abdominal discomfort and nausea, but no vomiting.   Chest x-ray in the ED demonstrating no acute abnormalities; but patient significantly dehydrated, with  acute kidney injury, hypokalemia and meeting criteria for severe sepsis.  Cultures taken, fluid resuscitation started and IV antibiotics given.  TRH contacted to place patient in the hospital for further evaluation and management.  Assessment and Plan: * Sepsis secondary to UTI [facility]) -Patient met criteria for severe sepsis at time of admission with leukocytosis, fever, tachycardia, tachypnea and presumed source of infection UTI/pneumonia.  Acute kidney injury appreciated as organ dysfunction. -Patient advised to maintain adequate hydration. -Sepsis features improving/resolved -At discharge will complete antibiotics by mouth using doxycycline and Keflex. -CBC and basic metabolic panel recommended to be rechecked at follow-up visit to assess stability and if needed to replete electrolytes. -Continue supportive care, as needed antipyretics and the use of bronchodilators/mucolytic's.  CVA (cerebral vascular accident) ([place]) -Prior history of stroke; with residual mild dysphagia and left hemiparesis. -No new focal deficits appreciated. -will Continue Eliquis for secondary prevention -Continue risk factor modifications. -Home health PT has been arranged at discharge.  Chronic diastolic HF (heart failure) (H[facility]) - Continue the use of beta-blocker and adjusted dose of Lasix -Heart healthy/low-sodium diet discussed with patient -Advised to maintain adequate hydration And instructed to check weight on daily basis.  Community acquired pneumonia - Good saturation on room air; having some intermittent coughing spells but no further fever and feeling ready to go home. -Continue antibiotics as instructed -Repeat chest x-ray in 6 weeks.  Paroxysmal atrial fibrillation (HCC) -continue eliquis for secondary prevention. -Continue the use of Cardizem and beta-blocker for rate control.   Dysphagia -As residual from previous stroke. -Mechanical soft diet with thin liquids has been ordered. -Patient take  medications crush in applesauce. -Continue supportive care and assistance. -No new deficits appreciated.   Benign essential HTN -Soft  at time of admission but has remained stable since then. -Resume home antihypertensive agents  -Advised to follow heart healthy diet. -Continue to maintain adequate hydration. -Reassess blood pressure at follow-up visit.  Cirrhosis of liver without ascites (HCC) -Continue outpatient follow-up with GI -CT abdomen and pelvis done while inpatient demonstrating some abnormalities that would benefit for dedicated liver MRI as an outpatient to rule out malignancy. -Will recommend continue to follow LFTs.   GERD (gastroesophageal reflux disease) -Continue PPI.  Hypercholesteremia -Continue statins -Continue to follow LFTs.   Consultants: None Procedures performed: See below for x-ray reports Disposition: Home with home health services. Diet recommendation: Heart healthy/low-sodium diet.  Dysphagia 3 with thin liquids.  DISCHARGE MEDICATION: Allergies as of 04/14/2022       Reactions   Tape Rash, Other (See Comments)   Please use paper tape        Medication List     TAKE these medications    acetaminophen 500 MG tablet Commonly known as: TYLENOL Take 1,000 mg by mouth at bedtime.   atorvastatin 80 MG tablet Commonly known as: LIPITOR Take 1 tablet (80 mg total) by mouth daily. What changed: when to take this   azelastine 0.1 % nasal spray Commonly known as: ASTELIN Place 1 spray into both nostrils at bedtime.   calcium citrate 950 (200 Ca) MG tablet Commonly known as: CALCITRATE - dosed in mg elemental calcium Take 1 tablet (200 mg of elemental calcium total) by mouth daily. What changed: when to take this   carvedilol 6.25 MG tablet Commonly known as: COREG Take 1 tablet (6.25 mg total) by mouth 2 (two) times daily with a meal.   cephALEXin 500 MG capsule Commonly known as: KEFLEX Take 1 capsule (500 mg total) by mouth 2  (two) times daily for 5 days.   Co Q-10 100 MG Caps Take 100 mg by mouth in the morning.   diltiazem 120 MG 24 hr capsule Commonly known as: DILACOR XR Take 120 mg by mouth daily.   doxycycline 100 MG tablet Commonly known as: VIBRA-TABS Take 1 tablet (100 mg total) by mouth 2 (two) times daily for 5 days.   doxylamine (Sleep) 25 MG tablet Commonly known as: UNISOM Take 1 tablet (25 mg total) by mouth at bedtime as needed. What changed:  when to take this reasons to take this   Eliquis 5 MG Tabs tablet Generic drug: apixaban TAKE (1) TABLET BY MOUTH TWICE DAILY. What changed: See the new instructions.   furosemide 40 MG tablet Commonly known as: LASIX Take 0.5 tablets (20 mg total) by mouth daily. TAKE (1) TABLET BY MOUTH ONCE DAILY. MAY TAKE ADDITIONAL HALF IF NEEDED FOR SEVERE SWELLING. Strength: 20 mg What changed: See the new instructions.   HYDROcodone-acetaminophen 10-325 MG tablet Commonly known as: NORCO Take 1 tablet by mouth at bedtime as needed for severe pain.   loperamide 2 MG capsule Commonly known as: IMODIUM Take 2 mg by mouth as needed for diarrhea or loose stools.   MULTIVITAMIN GUMMIES ADULT PO Take 2 tablets by mouth daily.   nitroGLYCERIN 0.4 MG SL tablet Commonly known as: NITROSTAT Place 0.4 mg under the tongue every 5 (five) minutes as needed for chest pain.   omeprazole 40 MG capsule Commonly known as: PRILOSEC Take 1 capsule (40 mg total) by mouth daily. What changed: Another medication with the same name was removed. Continue taking this medication, and follow the directions you see here.   ondansetron 4 MG tablet Commonly  known as: ZOFRAN Take 1 tablet (4 mg total) by mouth every 8 (eight) hours as needed for nausea or vomiting.   polyethylene glycol powder 17 GM/SCOOP powder Commonly known as: GLYCOLAX/MIRALAX Take 8.5 g by mouth daily.   SYSTANE OP Place 2 drops into both eyes 3 (three) times daily as needed (dry eyes).         Follow-up Information     [facility] [place] Follow up.   Specialty: Home Health Services Why: PT will call to schedule you first home visit. Contact information: [address] [place] [address] [phone]         Redmond School, MD. Schedule an appointment as soon as possible for a visit in 10 day(s).   Specialty: Internal Medicine Contact information: [address] [place] [address] [phone]         Arnoldo Lenis, MD .   Specialty: Cardiology Contact information: [address] [place] [address] [phone]                Discharge Exam: Danley Danker Weights   04/12/22 1238 04/13/22 0327 04/14/22 0500  Weight: 59 kg 61.8 kg 62.5 kg   General exam: Alert, awake, oriented, following commands appropriately; weak and deconditioned but overall stable.  Reports no chest pain, no nausea, no vomiting, no fever and feeling ready to go home. Respiratory system: Positive rhonchi, no using accessory muscles.  No wheezing on exam.  Good saturation on room air. Cardiovascular system: Irregular, no rubs or gallops; positive murmur.  No JVD on exam. Gastrointestinal system: Abdomen is nondistended, soft and nontender. No organomegaly or masses felt. Normal bowel sounds heard. Central nervous system: No new focal neurological deficits. Extremities: No no cyanosis or clubbing. Skin: No petechiae. Psychiatry: Mood & affect appropriate.   Condition at discharge: Stable and improved.  The results of significant diagnostics from this hospitalization (including imaging, microbiology, ancillary and laboratory) are listed below for reference.   Imaging Studies: CUP PACEART REMOTE DEVICE CHECK  Result Date: 04/14/2022 ILR summary report received. Battery status OK. Normal device function. No new symptom, tachy, brady, episodes. 115 new AF episodes, controlled rates, burden 96.2%, Eliquis.  Presenting rhythm AF. 2 pause events, each 3sec  04:10, and 07:33.  Monthly summary reports and ROV/PRN LA  CT CHEST ABDOMEN PELVIS W CONTRAST  Result Date: 04/12/2022 CLINICAL DATA:  Fever of unknown origin, sepsis EXAM: CT CHEST, ABDOMEN, AND PELVIS WITH CONTRAST TECHNIQUE: Multidetector CT imaging of the chest, abdomen and pelvis was performed following the standard protocol during bolus administration of intravenous contrast. RADIATION DOSE REDUCTION: This exam was performed according to the departmental dose-optimization program which includes automated exposure control, adjustment of the mA and/or kV according to patient size and/or use of iterative reconstruction technique. CONTRAST:  16m OMNIPAQUE IOHEXOL 300 MG/ML  SOLN COMPARISON:  04/12/2022, 05/07/2018 FINDINGS: CT CHEST FINDINGS Cardiovascular: The heart is enlarged, with significant left atrial dilation and marked calcification of the mitral annulus. No significant pericardial effusion. No evidence of thoracic aortic aneurysm or dissection. Marked atherosclerosis of the aorta and coronary vasculature. Mediastinum/Nodes: Thyroid and trachea are unremarkable. Minimal fluid within the upper thoracic esophagus is nonspecific. No pathologic mediastinal or hilar adenopathy. Esophageal varices again noted at the gastroesophageal junction. Lungs/Pleura: Small right pleural effusion with trace left pleural fluid also noted. Dependent atelectasis within the lower lobes. No airspace disease or pneumothorax. The central airways are patent. Musculoskeletal: No acute or destructive bony lesions. Reconstructed images demonstrate no  additional findings. CT ABDOMEN PELVIS FINDINGS Hepatobiliary: Shrunken nodular appearance of the liver consistent with cirrhosis. Indeterminate hyperenhancing region within the left lobe liver image 52/2 measures 4.5 x 2.7 cm. Nonemergent dedicated liver MRI with and without contrast is recommended to exclude underlying hepatocellular carcinoma. No intrahepatic duct dilation.  Minimal sludge within the gallbladder. No calcified gallstones or cholecystitis. Pancreas: Unremarkable. No pancreatic ductal dilatation or surrounding inflammatory changes. Spleen: Borderline splenomegaly measuring 12.9 cm in craniocaudal extent. No focal parenchymal abnormality. Adrenals/Urinary Tract: Stable left renal cortical cyst is not require imaging follow-up. Otherwise the kidneys enhance normally. No urinary tract calculi or obstruction. The adrenals and bladder appear grossly unremarkable. Stomach/Bowel: No bowel obstruction or ileus. Moderate retained stool within the distal colon. Scattered diverticulosis of the descending sigmoid colon is noted. There is mild sigmoid colon wall thickening which could reflect underlying colitis or diverticulitis. Vascular/Lymphatic: Diffuse aortic atherosclerosis again noted. Evaluation of the portal vein is somewhat limited due to the timing of contrast bolus, but the portal vein, SMV, and splenic vein appear patent. Splenic, gastric, esophageal varices are noted consistent with portal venous hypertension. Reproductive: Status post hysterectomy. No adnexal masses. Other: No free fluid or free intraperitoneal gas. No abdominal wall hernia. Musculoskeletal: No acute or destructive bony lesions. Postsurgical changes left hip. Bilateral L5 spondylolysis with grade 2 anterolisthesis of L5 on S1. Reconstructed images demonstrate no additional findings. IMPRESSION: 1. Cirrhosis, with 4.5 cm hyperenhancing region within the dorsal margin left lobe liver as above. When the patient is clinically stable and able to follow directions and hold their breath (preferably as an outpatient) further evaluation with dedicated abdominal MRI should be considered to exclude underlying hepatocellular carcinoma. 2. Portal venous hypertension, manifested by extensive varices and splenomegaly as above. 3. Mild sigmoid colon wall thickening, which may reflect underlying diverticulitis or  inflammatory/infectious colitis. 4. Gallbladder sludge, with no evidence of calcified gallstones or cholecystitis. 5. Small right pleural effusion.  Trace left pleural fluid. 6. Cardiomegaly, with extensive mitral annular calcification. 7.  Aortic Atherosclerosis (ICD10-I70.0). Electronically Signed   By: Randa Ngo M.D.   On: 04/12/2022 15:16   DG Chest Port 1 View  Result Date: 04/12/2022 CLINICAL DATA:  Fever. EXAM: PORTABLE CHEST 1 VIEW COMPARISON:  08/22/2020 FINDINGS: Cardiac silhouette is normal in size. No mediastinal or hilar masses. Clear lungs.  No pleural effusion or pneumothorax. Skeletal structures are demineralized, grossly intact. IMPRESSION: No active disease. Electronically Signed   By: Lajean Manes M.D.   On: 04/12/2022 13:17    Microbiology: Results for orders placed or performed during the hospital encounter of 04/12/22  SARS Coronavirus 2 by RT PCR (hospital order, performed in [facility] hospital lab) *cepheid single result test* Anterior Nasal Swab     Status: None   Collection Time: 04/12/22 12:59 PM   Specimen: Anterior Nasal Swab  Result Value Ref Range Status   SARS Coronavirus 2 by RT PCR NEGATIVE NEGATIVE Final    Comment: (NOTE) SARS-CoV-2 target nucleic acids are NOT DETECTED.  The SARS-CoV-2 RNA is generally detectable in upper and lower respiratory specimens during the acute phase of infection. The lowest concentration of SARS-CoV-2 viral copies this assay can detect is 250 copies / mL. A negative result does not preclude SARS-CoV-2 infection and should not be used as the sole basis for treatment or other patient management decisions.  A negative result may occur with improper specimen collection / handling, submission of specimen other than nasopharyngeal swab, presence of viral mutation(s) within the  areas targeted by this assay, and inadequate number of viral copies (<250 copies / mL). A negative result must be combined with clinical observations,  patient history, and epidemiological information.  Fact Sheet for Patients:   https://www.patel.info/  Fact Sheet for Healthcare Providers: https://hall.com/  This test is not yet approved or  cleared by the [place] FDA and has been authorized for detection and/or diagnosis of SARS-CoV-2 by FDA under an Emergency Use Authorization (EUA).  This EUA will remain in effect (meaning this test can be used) for the duration of the COVID-19 declaration under Section 564(b)(1) of the Act, 21 U.S.C. section 360bbb-3(b)(1), unless the authorization is terminated or revoked sooner.  Performed at [facility], [address] [place], [place] [address]   Culture, blood (Routine x 2)     Status: None (Preliminary result)   Collection Time: 04/12/22  1:00 PM   Specimen: BLOOD LEFT FOREARM  Result Value Ref Range Status   Specimen Description   Final    BLOOD LEFT FOREARM BOTTLES DRAWN AEROBIC AND ANAEROBIC   Special Requests Blood Culture adequate volume  Final   Culture   Final    NO GROWTH < 12 HOURS Performed at [facility], [address], [place], [place] [address]    Report Status PENDING  Incomplete  Culture, blood (Routine x 2)     Status: None (Preliminary result)   Collection Time: 04/12/22  1:02 PM   Specimen: Right Antecubital; Blood  Result Value Ref Range Status   Specimen Description   Final    RIGHT ANTECUBITAL BOTTLES DRAWN AEROBIC AND ANAEROBIC   Special Requests Blood Culture adequate volume  Final   Culture   Final    NO GROWTH < 12 HOURS Performed at [facility], [address] [place], [place] [address]    Report Status PENDING  Incomplete    Labs: CBC: Recent Labs  Lab 04/12/22 1300 04/13/22 0534 04/14/22 0446  WBC 25.4* 16.4* 10.9*  NEUTROABS 23.6*  --   --   HGB 13.2 11.2* 10.9*  HCT 38.3 32.3* 32.4*  MCV 91.8 93.6 94.7  PLT 121* 86* 85*   Basic Metabolic Panel: Recent Labs  Lab 04/12/22 1300  04/13/22 0534 04/14/22 0446  NA 138 140 138  K 2.3* 2.8* 3.9  CL 105 109 112*  CO2 _0 GLUCOSE 164* 133* 113*  BUN _1 CREATININE 1.46* 1.14* 0.98  CALCIUM 8.9 8.5* 8.3*  MG 1.3*  --   --   PHOS 1.9*  --   --    Liver Function Tests: Recent Labs  Lab 04/12/22 1300 04/14/22 0446  AST 48* 75*  ALT 21 28  ALKPHOS 101 74  BILITOT 3.6* 1.7*  PROT 6.1* 5.2*  ALBUMIN 3.0* 2.3*   CBG: Recent Labs  Lab 04/13/22 0315  GLUCAP 119*    Discharge time spent: greater than 30 minutes.  Signed: Barton Dubois, MD [facility] 04/14/2022

## 2022-04-14 NOTE — TOC Progression Note (Signed)
Transition of Care [facility]) - Progression Note    Patient Details  Name: [name] MRN: [id number] Date of Birth: [birth year]  [facility]) CM/SW Contact  Boneta Lucks, RN Phone Number: 04/14/2022, 3:35 PM  Clinical Narrative:   PT is recommending home health PT. TOC spoke with her son, they have used several companies, they have no preference. Marjory Lies with [facility] accepted the referral. MD aware to order. Add to AVS.   Expected Discharge Plan: Home/Self Care Barriers to Discharge: Continued Medical Work up  Expected Discharge Plan and Services Expected Discharge Plan: Home/Self Care In-house Referral: Clinical Social Work Discharge Planning Services: CM Consult Post Acute Care Choice: [place] arrangements for the past 2 months: Single Family Home                     Readmission Risk Interventions    04/14/2022    3:34 PM 04/13/2022   10:50 AM  Readmission Risk Prevention Plan  Transportation Screening Complete Complete  PCP or Specialist Appt within 5-7 Days Not Complete   Home Care Screening Complete   Medication Review (RN CM) Complete   HRI or Home Care Consult  Complete  Social Work Consult for [place] Planning/Counseling  Complete  Palliative Care Screening  Not Applicable  Medication Review [employer])  Complete

## 2022-04-14 NOTE — Evaluation (Signed)
Physical Therapy Evaluation Patient Details Name: JACIA SICKMAN MRN: [id number] DOB: 1941-07-20 Today's Date: 04/14/2022  History of Present Illness  [name] is a 81 y.o. female with medical history significant of aortic insufficiency, cirrhosis, hypertension, hyperlipidemia, paroxysmal atrial fibrillation, history of stroke (with residual dysphagia left hemiparesis), gastroesophageal reflux and history of recurring UTIs; presented to the ED with complaints of intermittent coughing spells, fever, general malaise and less interactive from baseline.  Family reports symptom has been slowly progressing over the last 5 to 6 days and worsening in the last 24 hours prior to admission.  No sick contacts, no chest pain, no hematuria, no melena, no hematochezia, no new focal deficits.      There have reports of some abdominal discomfort and nausea, but no vomiting.   Clinical Impression  Patient demonstrates slow labored movement for sitting up at bedside, requires increased time with Mod hand held assist and leaning on armrest of chair to maintain standing balance during transfer to chair.  Patient incontinent during transfer to chair - NT notified.  Patient will benefit from continued skilled physical therapy in hospital and recommended venue below to increase strength, balance, endurance for safe ADLs and gait.         Recommendations for follow up therapy are one component of a multi-disciplinary discharge planning process, led by the attending physician.  Recommendations may be updated based on patient status, additional functional criteria and insurance authorization.  Follow Up Recommendations Home health PT      Assistance Recommended at Discharge Intermittent Supervision/Assistance  Patient can return home with the following  A lot of help with bathing/dressing/bathroom;Help with stairs or ramp for entrance;Assistance with cooking/housework;A lot of help with walking and/or transfers     Equipment Recommendations None recommended by PT  Recommendations for Other Services       Functional Status Assessment Patient has had a recent decline in their functional status and demonstrates the ability to make significant improvements in function in a reasonable and predictable amount of time.     Precautions / Restrictions Precautions Precautions: Fall Restrictions Weight Bearing Restrictions: No      Mobility  Bed Mobility Overal bed mobility: Needs Assistance Bed Mobility: Supine to Sit     Supine to sit: Mod assist     General bed mobility comments: slow labored movement    Transfers Overall transfer level: Needs assistance Equipment used: 1 person hand held assist Transfers: Sit to/from Stand, Bed to chair/wheelchair/BSC Sit to Stand: Min assist   Step pivot transfers: Min assist, Mod assist       General transfer comment: requires hand held assistance, increased time    Ambulation/Gait Ambulation/Gait assistance: Mod assist Gait Distance (Feet): 3 Feet Assistive device: 1 person hand held assist Gait Pattern/deviations: Decreased step length - left, Decreased stance time - right, Decreased stride length Gait velocity: slow     General Gait Details: limited to a few slow labored steps at bedside leaning on armrest of chair and Mod hand held assist  Stairs            Wheelchair Mobility    Modified Rankin (Stroke Patients Only)       Balance Overall balance assessment: Needs assistance Sitting-balance support: Feet supported, No upper extremity supported Sitting balance-Leahy Scale: Fair Sitting balance - Comments: fair/good seated at EOB   Standing balance support: During functional activity, Bilateral upper extremity supported Standing balance-Leahy Scale: Poor Standing balance comment: leaning on armrest of chair and hand  held assist                             Pertinent Vitals/Pain Pain Assessment Pain  Assessment: No/denies pain    Home Living Family/patient expects to be discharged to:: Private residence Living Arrangements: Other relatives Available Help at Discharge: Family;Available 24 hours/day Type of Home: House Home Access: Stairs to enter Entrance Stairs-Rails: Right Entrance Stairs-Number of Steps: 3 steps from car to yard, 2 steps from yard to [place]: One level Home Equipment: Conservation officer, nature (2 wheels);Wheelchair - manual;BSC/3in1;Shower seat;Cane - single point      Prior Function Prior Level of Function : Needs assist       Physical Assist : Mobility (physical);ADLs (physical) Mobility (physical): Bed mobility;Transfers;Gait;Stairs   Mobility Comments: uses wheelchair for mobility, able to take a few steps during tranfers using RW, otherwise stand pivots for most transfers ADLs Comments: assisted by family     Hand Dominance   Dominant Hand: Right    Extremity/Trunk Assessment   Upper Extremity Assessment Upper Extremity Assessment: Generalized weakness    Lower Extremity Assessment Lower Extremity Assessment: Generalized weakness    Cervical / Trunk Assessment Cervical / Trunk Assessment: Normal  Communication   Communication: No difficulties  Cognition Arousal/Alertness: Awake/alert Behavior During Therapy: WFL for tasks assessed/performed Overall Cognitive Status: Within Functional Limits for tasks assessed                                          General Comments      Exercises     Assessment/Plan    PT Assessment Patient needs continued PT services  PT Problem List Decreased strength;Decreased activity tolerance;Decreased mobility;Decreased balance       PT Treatment Interventions DME instruction;Gait training;Functional mobility training;Therapeutic activities;Therapeutic exercise;Stair training;Patient/family education;Balance training    PT Goals (Current goals can be found in the Care Plan section)   Acute Rehab PT Goals Patient Stated Goal: return home with family to assist PT Goal Formulation: With patient Time For Goal Achievement: 04/18/22 Potential to Achieve Goals: Good    Frequency Min 3X/week     Co-evaluation               AM-PAC PT "6 Clicks" Mobility  Outcome Measure Help needed turning from your back to your side while in a flat bed without using bedrails?: A Little Help needed moving from lying on your back to sitting on the side of a flat bed without using bedrails?: A Lot Help needed moving to and from a bed to a chair (including a wheelchair)?: A Lot Help needed standing up from a chair using your arms (e.g., wheelchair or bedside chair)?: A Lot Help needed to walk in hospital room?: A Lot Help needed climbing 3-5 steps with a railing? : Total 6 Click Score: 12    End of Session   Activity Tolerance: Patient tolerated treatment well;Patient limited by fatigue Patient left: in chair;with call bell/phone within reach Nurse Communication: Mobility status PT Visit Diagnosis: Unsteadiness on feet (R26.81);Other abnormalities of gait and mobility (R26.89);Muscle weakness (generalized) (M62.81)    Time: 7416-3845 PT Time Calculation (min) (ACUTE ONLY): 24 min   Charges:   PT Evaluation $PT Eval Moderate Complexity: 1 Mod PT Treatments $Therapeutic Activity: 23-37 mins        2:56 PM, 04/14/22 Jeneen Rinks  Chrissie Noa, MPT Physical Therapist with [facility] 336 [phone] office [phone] mobile phone

## 2022-04-16 ENCOUNTER — Ambulatory Visit (HOSPITAL_COMMUNITY)
Admission: RE | Admit: 2022-04-16 | Discharge: 2022-04-16 | Disposition: A | Discharge status: Home / Self Care | Payer: Medicare Other | Source: Ambulatory Visit | Attending: Internal Medicine | Admitting: Internal Medicine

## 2022-04-16 DIAGNOSIS — Z8673 Personal history of transient ischemic attack (TIA), and cerebral infarction without residual deficits: Secondary | ICD-10-CM | POA: Diagnosis not present

## 2022-04-16 DIAGNOSIS — N390 Urinary tract infection, site not specified: Secondary | ICD-10-CM | POA: Diagnosis not present

## 2022-04-16 DIAGNOSIS — R059 Cough, unspecified: Secondary | ICD-10-CM | POA: Insufficient documentation

## 2022-04-16 DIAGNOSIS — Z6823 Body mass index (BMI) 23.0-23.9, adult: Secondary | ICD-10-CM | POA: Diagnosis not present

## 2022-04-16 DIAGNOSIS — J90 Pleural effusion, not elsewhere classified: Secondary | ICD-10-CM | POA: Diagnosis not present

## 2022-04-16 DIAGNOSIS — I110 Hypertensive heart disease with heart failure: Secondary | ICD-10-CM | POA: Diagnosis not present

## 2022-04-16 DIAGNOSIS — K589 Irritable bowel syndrome without diarrhea: Secondary | ICD-10-CM | POA: Diagnosis not present

## 2022-04-16 DIAGNOSIS — Z7901 Long term (current) use of anticoagulants: Secondary | ICD-10-CM | POA: Diagnosis not present

## 2022-04-16 DIAGNOSIS — I5032 Chronic diastolic (congestive) heart failure: Secondary | ICD-10-CM | POA: Diagnosis not present

## 2022-04-16 DIAGNOSIS — Z955 Presence of coronary angioplasty implant and graft: Secondary | ICD-10-CM | POA: Diagnosis not present

## 2022-04-16 DIAGNOSIS — N179 Acute kidney failure, unspecified: Secondary | ICD-10-CM | POA: Diagnosis not present

## 2022-04-16 DIAGNOSIS — E7800 Pure hypercholesterolemia, unspecified: Secondary | ICD-10-CM | POA: Diagnosis not present

## 2022-04-16 DIAGNOSIS — E872 Acidosis, unspecified: Secondary | ICD-10-CM | POA: Diagnosis not present

## 2022-04-16 DIAGNOSIS — I350 Nonrheumatic aortic (valve) stenosis: Secondary | ICD-10-CM | POA: Diagnosis not present

## 2022-04-16 DIAGNOSIS — D696 Thrombocytopenia, unspecified: Secondary | ICD-10-CM | POA: Diagnosis not present

## 2022-04-16 DIAGNOSIS — A419 Sepsis, unspecified organism: Secondary | ICD-10-CM | POA: Diagnosis not present

## 2022-04-16 DIAGNOSIS — K219 Gastro-esophageal reflux disease without esophagitis: Secondary | ICD-10-CM | POA: Diagnosis not present

## 2022-04-16 DIAGNOSIS — D649 Anemia, unspecified: Secondary | ICD-10-CM | POA: Diagnosis not present

## 2022-04-16 DIAGNOSIS — E119 Type 2 diabetes mellitus without complications: Secondary | ICD-10-CM | POA: Diagnosis not present

## 2022-04-16 DIAGNOSIS — K7460 Unspecified cirrhosis of liver: Secondary | ICD-10-CM | POA: Diagnosis not present

## 2022-04-16 DIAGNOSIS — I739 Peripheral vascular disease, unspecified: Secondary | ICD-10-CM | POA: Diagnosis not present

## 2022-04-16 DIAGNOSIS — I480 Paroxysmal atrial fibrillation: Secondary | ICD-10-CM | POA: Diagnosis not present

## 2022-04-16 DIAGNOSIS — G4733 Obstructive sleep apnea (adult) (pediatric): Secondary | ICD-10-CM | POA: Diagnosis not present

## 2022-04-16 DIAGNOSIS — R1310 Dysphagia, unspecified: Secondary | ICD-10-CM | POA: Diagnosis not present

## 2022-04-16 DIAGNOSIS — K449 Diaphragmatic hernia without obstruction or gangrene: Secondary | ICD-10-CM | POA: Diagnosis not present

## 2022-04-16 DIAGNOSIS — I10 Essential (primary) hypertension: Secondary | ICD-10-CM | POA: Diagnosis not present

## 2022-04-16 DIAGNOSIS — K7469 Other cirrhosis of liver: Secondary | ICD-10-CM | POA: Diagnosis not present

## 2022-04-16 DIAGNOSIS — I4891 Unspecified atrial fibrillation: Secondary | ICD-10-CM | POA: Diagnosis not present

## 2022-04-16 DIAGNOSIS — I2510 Atherosclerotic heart disease of native coronary artery without angina pectoris: Secondary | ICD-10-CM | POA: Diagnosis not present

## 2022-04-16 DIAGNOSIS — F17200 Nicotine dependence, unspecified, uncomplicated: Secondary | ICD-10-CM | POA: Diagnosis not present

## 2022-04-16 DIAGNOSIS — E1140 Type 2 diabetes mellitus with diabetic neuropathy, unspecified: Secondary | ICD-10-CM | POA: Diagnosis not present

## 2022-04-17 LAB — CULTURE, BLOOD (ROUTINE X 2)
Culture: NO GROWTH
Culture: NO GROWTH
Special Requests: ADEQUATE
Special Requests: ADEQUATE

## 2022-04-30 ENCOUNTER — Encounter: Payer: Self-pay | Admitting: Cardiology

## 2022-04-30 ENCOUNTER — Ambulatory Visit: Payer: Medicare Other | Admitting: Cardiology

## 2022-04-30 VITALS — BP 138/66 | HR 86 | Ht 63.0 in | Wt 127.0 lb

## 2022-04-30 DIAGNOSIS — I2510 Atherosclerotic heart disease of native coronary artery without angina pectoris: Secondary | ICD-10-CM | POA: Diagnosis not present

## 2022-04-30 DIAGNOSIS — I350 Nonrheumatic aortic (valve) stenosis: Secondary | ICD-10-CM

## 2022-04-30 DIAGNOSIS — E782 Mixed hyperlipidemia: Secondary | ICD-10-CM

## 2022-04-30 DIAGNOSIS — I4891 Unspecified atrial fibrillation: Secondary | ICD-10-CM | POA: Diagnosis not present

## 2022-04-30 DIAGNOSIS — D6869 Other thrombophilia: Secondary | ICD-10-CM

## 2022-04-30 NOTE — Patient Instructions (Signed)
Medication Instructions:  Your physician recommends that you continue on your current medications as directed. Please refer to the Current Medication list given to you today.   Labwork: None  Testing/Procedures: Your physician has requested that you have an echocardiogram. Echocardiography is a painless test that uses sound waves to create images of your heart. It provides your doctor with information about the size and shape of your heart and how well your heart's chambers and valves are working. This procedure takes approximately one hour. There are no restrictions for this procedure.   Follow-Up: Follow up in 6 months with Dr. Harl Bowie.   Any Other Special Instructions Will Be Listed Below (If Applicable).     If you need a refill on your cardiac medications before your next appointment, please call your pharmacy.

## 2022-04-30 NOTE — Progress Notes (Signed)
Clinical Summary [name] is a 81 y.o.female seen today for follow up of the following medical problems.    1. PAF/Prior history of PSVT - history of cryptogenic stroke, loop recorder placed and detected afib - seen in afib clinic.  - PAF, she is on diltiazem and toprol. Eliquis for stroke prevention.    - no palpitations - compliant with med, no bleeding on eliquis.    2. CAD - history of MI in 1995, received 2 stents to RCA -  cath 2007 with patent coronaries.  - cath Jan 2018 nonobstructive disease - echo 10/2016 LVEF 65-70%, no WMAs   - admit 10/2016 with elevated troponin to 1.33, Thought to be demand ischemia related to her SVT at the time - admit 12/2016 with chest pain. Mild troponin 0.32 at that time. Managed medically due to recent normal cath and stable echo     - no chest pains.    3. Carotid stenosis - 04/2016 moderate bilateral disease  08/2018 mild  12/2019 CTA: <50% bilateral disease -11/2021 CTA: Short-segment 70% stenosis at the left carotid bulb/proximal cervical left ICA, similar to previous. 4. Focal moderate right P2 stenosis, also similar. 5. Right larger than left layering pleural effusions, partially visualized.   4. Cryptogenic cirrhosis  - followed by GI   5. Aortic valve disease - 06/2018 moderate to severe AI, LVEF 65-70%, no LVIDs reported. Does not mention aortic stenosis however mean grad 27 but no AVA VTI given. From 04/2018 study had moderate AS with mean grad 21 and AVA VTI 1.06   12/2019 echo mean grad 19, AVA VTI 1.14 - no recent symptoms   02/2021 echo: LVEF 60-65%, no WMAs, indet diastolic, mod MS(mean grad 5), mod to severe AS(mean grad 26, AVA VTI 0.87, DI 0.34, SVI 32  02/2022 echo: LVEF 65-70%, AVA VTI 0.6, mean grad 24, DI 0.3, SVI 27 - sedentary lifestyle, limited due to chronic weakness.      6. Mitral stenosis -12/2019 echo mean grad 5 - 02/2021 echo: LVEF 60-65%, no WMAs, indet diastolic, mod MS(mean grad 5), mod to severe  AS(mean grad 26, AVA VTI 0.87, DI 0.34, SVI 32  02/2022 echo MV mean grad 5   7. History of CVA - admittion 07/2018, 12/2019  - has loop recorder for history of cryptogenic stroke.    8. Hyperlipidemia 06/2018 TC 109 TG 50 HDL 49 LDL 50 12/2019 TC 162 HDL 63 TG 78 LDL 83 -has been on atorvastatin 51m long term   - labs followed by pcp  11/2021 TC 102 TG 52 HDL 34 LDL 58    9. HTN -has not taken meds yet    10. Leg weakness - typically in wheel chair, but can use walker for short distances  11. Admit 04/2022 with sepsis due to UTI Past Medical History:  Diagnosis Date   Anemia    Aortic insufficiency    Moderate   Atrial fibrillation (HCC)    Back pain    Carotid stenosis, bilateral    Cir[place]osis [place]    Closed left femoral fracture (HCC) 04/21/2017   Coronary artery disease    Stent x 2 RCA 1995, cardiac catheterization 09/2016 showing only mild atherosclerosis   DDD (degenerative disc disease), lumbar    Diabetes mellitus without complication ([place]    Essential hypertension    Falls    GERD (gastroesophageal reflux disease)    Hiatal hernia    History of stroke    Hypercholesteremia  IBS (irritable bowel syndrome)    Non-alcoholic fatty liver disease    OSA (obstructive sleep apnea)    no CPAP   Pericardial effusion    a. small by echo 2018.   PSVT (paroxysmal supraventricular tachycardia) (HCC)    Recurrent UTI    Seizures ([place]) 2018   Stroke ([place]) 07/2019   several strokes within the last 10 years   Swallowing difficulty    Varices, esophageal (HCC)      Allergies  Allergen Reactions   Tape Rash and Other (See Comments)    Please use paper tape     Current Outpatient Medications  Medication Sig Dispense Refill   acetaminophen (TYLENOL) 500 MG tablet Take 1,000 mg by mouth at bedtime.     apixaban (ELIQUIS) 5 MG TABS tablet TAKE (1) TABLET BY MOUTH TWICE DAILY. (Patient taking differently: Take 5 mg by mouth 2 (two) times daily.) 60 tablet 5    atorvastatin (LIPITOR) 80 MG tablet Take 1 tablet (80 mg total) by mouth daily. (Patient taking differently: Take 80 mg by mouth at bedtime.) 30 tablet 1   azelastine (ASTELIN) 0.1 % nasal spray Place 1 spray into both nostrils at bedtime.     calcium citrate (CALCITRATE - DOSED IN MG ELEMENTAL CALCIUM) 950 MG tablet Take 1 tablet (200 mg of elemental calcium total) by mouth daily. (Patient taking differently: Take 200 mg of elemental calcium by mouth 2 (two) times daily.) 30 tablet 1   carvedilol (COREG) 6.25 MG tablet Take 1 tablet (6.25 mg total) by mouth 2 (two) times daily with a meal. 180 tablet 3   Coenzyme Q10 (CO Q-10) 100 MG CAPS Take 100 mg by mouth in the morning.     diltiazem (DILACOR XR) 120 MG 24 hr capsule Take 120 mg by mouth daily.     doxylamine, Sleep, (UNISOM) 25 MG tablet Take 1 tablet (25 mg total) by mouth at bedtime as needed.     furosemide (LASIX) 40 MG tablet Take 0.5 tablets (20 mg total) by mouth daily. TAKE (1) TABLET BY MOUTH ONCE DAILY. MAY TAKE ADDITIONAL HALF IF NEEDED FOR SEVERE SWELLING. Strength: 20 mg 45 tablet 0   HYDROcodone-acetaminophen (NORCO) 10-325 MG tablet Take 1 tablet by mouth at bedtime as needed for severe pain.     loperamide (IMODIUM) 2 MG capsule Take 2 mg by mouth as needed for diarrhea or loose stools.     Multiple Vitamins-Minerals (MULTIVITAMIN GUMMIES ADULT PO) Take 2 tablets by mouth daily.     nitroGLYCERIN (NITROSTAT) 0.4 MG SL tablet Place 0.4 mg under the tongue every 5 (five) minutes as needed for chest pain.     omeprazole (PRILOSEC) 40 MG capsule Take 1 capsule (40 mg total) by mouth daily. 90 capsule 3   ondansetron (ZOFRAN) 4 MG tablet Take 1 tablet (4 mg total) by mouth every 8 (eight) hours as needed for nausea or vomiting. 30 tablet 1   Polyethyl Glycol-Propyl Glycol (SYSTANE OP) Place 2 drops into both eyes 3 (three) times daily as needed (dry eyes).     polyethylene glycol powder (GLYCOLAX/MIRALAX) 17 GM/SCOOP powder Take 8.5  g by mouth daily. 255 g 0   No current facility-administered medications for this visit.     Past Surgical History:  Procedure Laterality Date   ANAL FISSURE [place]   CARDIAC CATHETERIZATION  [phone]   Stent to the proximal RCA after MI  CARDIAC CATHETERIZATION N/A 09/26/2016   Procedure: Left Heart Cath and Coronary Angiography;  Surgeon: Leonie Man, MD;  Location: [facility];  Service: Cardiovascular;  Laterality: N/A;   CATARACT EXTRACTION W/PHACO Right 07/04/2014   Procedure: CATARACT EXTRACTION PHACO AND INTRAOCULAR LENS PLACEMENT (IOC);  Surgeon: Elta Guadeloupe T. Gershon Crane, MD;  Location: [facility];  Service: Ophthalmology;  Laterality: Right;  CDE:13.13   COLONOSCOPY     DILATION AND CURETTAGE OF UTERUS     x2   Epi Retinal Membrane Peel Left    ERCP     ESOPHAGEAL BANDING N/A 04/01/2013   Procedure: ESOPHAGEAL BANDING;  Surgeon: Rogene Houston, MD;  [facility];  Service: Endoscopy;  Laterality: N/A;   ESOPHAGEAL BANDING N/A 05/24/2013   Procedure: ESOPHAGEAL BANDING;  Surgeon: Rogene Houston, MD;  [facility];  Service: Endoscopy;  Laterality: N/A;   ESOPHAGEAL BANDING N/A 06/21/2014   Procedure: ESOPHAGEAL BANDING;  Surgeon: Rogene Houston, MD;  [facility];  Service: Endoscopy;  Laterality: N/A;   ESOPHAGEAL BANDING  06/15/2020   Procedure: ESOPHAGEAL BANDING;  Surgeon: Harvel Quale, MD;  [facility];  Service: Gastroenterology;;   ESOPHAGOGASTRODUODENOSCOPY N/A 04/01/2013   Procedure: ESOPHAGOGASTRODUODENOSCOPY (EGD);  Surgeon: Rogene Houston, MD;  [facility];  Service: Endoscopy;  Laterality: N/A;  230-rescheduled to 8:30am Ann notified pt   ESOPHAGOGASTRODUODENOSCOPY N/A 05/24/2013   Procedure: ESOPHAGOGASTRODUODENOSCOPY (EGD);  Surgeon: Rogene Houston, MD;  [facility];  Service: Endoscopy;  Laterality: N/A;  730    ESOPHAGOGASTRODUODENOSCOPY N/A 06/21/2014   Procedure: ESOPHAGOGASTRODUODENOSCOPY (EGD);  Surgeon: Rogene Houston, MD;  [facility];  Service: Endoscopy;  Laterality: N/A;  930-rescheduled 10/14 @ 1200 Ann to notify pt   ESOPHAGOGASTRODUODENOSCOPY (EGD) WITH PROPOFOL N/A 06/15/2020   Procedure: ESOPHAGOGASTRODUODENOSCOPY (EGD) WITH PROPOFOL;  Surgeon: Harvel Quale, MD;  [facility];  Service: Gastroenterology;  Laterality: N/A;  230   ESOPHAGOGASTRODUODENOSCOPY (EGD) WITH PROPOFOL N/A 07/27/2020   Procedure: ESOPHAGOGASTRODUODENOSCOPY (EGD) WITH PROPOFOL;  Surgeon: Harvel Quale, MD;  [facility];  Service: Gastroenterology;  Laterality: N/A;  1245   EYE SURGERY  08   cataract surgery of the left eye   FEMUR IM NAIL Left 03/02/2017   Procedure: INTRAMEDULLARY (IM) NAIL FEMORAL;  Surgeon: Rod Can, MD;  Location: [facility];  Service: Orthopedics;  Laterality: Left;   HARDWARE REMOVAL Right 01/17/2013   Procedure: REMOVAL OF HARDWARE AND EXCISION ULNAR STYLOID RIGHT WRIST;  Surgeon: Tennis Must, MD;  Location: [place];  Service: Orthopedics;  Laterality: Right;   implanted heart monitor  02/2020   to monitor Afib   MYRINGOTOMY  2012   both ears   ORIF FEMUR FRACTURE Left 04/23/2017   Procedure: OPEN REDUCTION INTERNAL FIXATION (ORIF) DISTAL FEMUR FRACTURE (FRACTURE AROUND FEMORAL NAIL);  Surgeon: Altamese Orrick, MD;  Location: [facility];  Service: Orthopedics;  Laterality: Left;   TONSILLECTOMY     TUBAL LIGATION     VAGINAL HYSTERECTOMY  1972   WRIST SURGERY     rt wrist hardwear removal     Allergies  Allergen Reactions   Tape Rash and Other (See Comments)    Please use paper tape      Family History  Problem Relation Age of Onset   Diabetes Mother    Heart failure Father    Heart failure Maternal Aunt      Social History  [name] reports that she has been smoking cigarettes. She has a 12.50  pack-year smoking history. She has never used smokeless tobacco. [name] reports no history of alcohol use.   Review of Systems CONSTITUTIONAL: No weight loss, fever, chills, weakness or fatigue.  HEENT: Eyes: No visual loss, blurred vision, double vision or yellow sclerae.No hearing loss, sneezing, congestion, runny nose or sore throat.  SKIN: No rash or itching.  CARDIOVASCULAR: per hpi RESPIRATORY: per hpi GASTROINTESTINAL: No anorexia, nausea, vomiting or diarrhea. No abdominal pain or blood.  GENITOURINARY: No burning on urination, no polyuria NEUROLOGICAL: No headache, dizziness, syncope, paralysis, ataxia, numbness or tingling in the extremities. No change in bowel or bladder control.  MUSCULOSKELETAL: No muscle, back pain, joint pain or stiffness.  LYMPHATICS: No enlarged nodes. No history of splenectomy.  PSYCHIATRIC: No history of depression or anxiety.  ENDOCRINOLOGIC: No reports of sweating, cold or heat intolerance. No polyuria or polydipsia.  Marland Kitchen   Physical Examination Today's Vitals   04/30/22 0943  BP: 138/66  Pulse: 86  SpO2: 97%  Weight: 127 lb (57.6 kg)  Height: _0  (1.6 m)   Body mass index is 22.5 kg/m.  Gen: resting comfortably, no acute distress HEENT: no scleral icterus, pupils equal round and reactive, no palptable cervical adenopathy,  CV: irreg, 3/6 systolic murmu rusb, no jvd Resp: Clear to auscultation bilaterally GI: abdomen is soft, non-tender, non-distended, normal bowel sounds, no hepatosplenomegaly MSK: extremities are warm, no edema.  Skin: warm, no rash Neuro:  no focal deficits Psych: appropriate affect   Diagnostic Studies   06/2018 echo Study Conclusions   - Left ventricle: The cavity size was normal. Wall thickness was   increased in a pattern of moderate LVH. Systolic function was   vigorous. The estimated ejection fraction was in the range of 65%   to 70%. Features are consistent with a pseudonormal left   ventricular  filling pattern, with concomitant abnormal relaxation   and increased filling pressure (grade 2 diastolic dysfunction). - Aortic valve: Mildly calcified annulus. There was moderate to   severe regurgitation. - Mitral valve: There was mild to moderate regurgitation. - Left atrium: The atrium was severely dilated.   Impressions:   - Technically difficult echo   No parasternal views.     LV systolic function   The aortic insufficiency is at least moderate - severe in   severity .     08/2018 carotid US Summary: Right Carotid: Velocities in the right ICA are consistent with a 1-39% stenosis.   Left Carotid: Velocities in the left ICA are consistent with a 1-39% stenosis,               calcific plaque may obscure higher velicity.   12/2019 echo 1. Left ventricular ejection fraction, by estimation, is 60 to 65%. The  left ventricle has normal function. The left ventricle has no regional  wall motion abnormalities. There is mild left ventricular hypertrophy.  Left ventricular diastolic parameters  are consistent with Grade II diastolic dysfunction (pseudonormalization).  Elevated left atrial pressure.   2. Right ventricular systolic function is normal. The right ventricular  size is normal. There is mildly elevated pulmonary artery systolic  pressure.   3. Left atrial size was severely dilated.   4. The mitral valve was not well visualized. Mild mitral valve  regurgitation. Moderate mitral stenosis.MV peak gradient, 13.4 mmHg. The  mean mitral valve gradient is 5.0 mmHg.   5. The aortic valve was not well  visualized. Aortic valve regurgitation  is moderate. Moderate aortic valve stenosis.   6. The inferior vena cava is normal in size with greater than 50%  respiratory variability, suggesting right atrial pressure of 3 mmHg.    02/2021 echo IMPRESSIONS     1. Left ventricular ejection fraction, by estimation, is 60 to 65%. The  left ventricle has normal function. The left  ventricle has no regional  wall motion abnormalities. There is mild left ventricular hypertrophy.  Left ventricular diastolic parameters  are indeterminate.   2. Right ventricular systolic function is normal. The right ventricular  size is normal.   3. Left atrial size was severely dilated.   4. Right atrial size was moderately dilated.   5. Previous MV gradients on TTE 01/05/20 mean 5 peak 13.4 mmHg at a HR of  68 bpm Gradients similar today at higher HR of 100 bpm MS primarily from  MAC especially posterior annulus and subchordal area . The mitral valve is  degenerative. Trivial mitral  valve regurgitation. Moderate mitral stenosis. Severe mitral annular  calcification.   6. Gradients have increased since echo 01/05/20 Degree of AR similar . The  aortic valve is tricuspid. There is moderate calcification of the aortic  valve. Aortic valve regurgitation is moderate. Moderate to severe aortic  valve stenosis.   7. The inferior vena cava is dilated in size with <50% respiratory  variability, suggesting right atrial pressure of 15 mmHg.   Assessment and Plan  1. Afib/prior PSVT/acquired thrombophlia - no symptoms, continue current meds including eliquis for stroke prevent.   -pending weight at f/u may need to lower her eliquis to 2.61m bid once she is 81yo.    2.  CAD -no symptoms, continue curren tmeds   3.Aortic stenosis/Mitral stenosis -02/2022 echo consistent with paradoxical low flow low gradient mod to severe AS.  - no symptoms though sedentary due to chronic generalized weakness - repeat echo at 6 months which would be 08/2022.   4. Carotid stenosis - [place]   5. Hyperlipidemia - at goal, continue current meds. LDL goal 55 or less, she is right at 58 today which is reasonable. If trends up would consider changing to crestor.    JArnoldo Lenis M.D.,

## 2022-05-09 ENCOUNTER — Telehealth: Payer: Self-pay | Admitting: *Deleted

## 2022-05-09 ENCOUNTER — Encounter: Payer: Self-pay | Admitting: *Deleted

## 2022-05-09 NOTE — Patient Outreach (Signed)
  Care Coordination   Initial Visit Note   05/09/2022 Name: [name] MRN: [id number] DOB: December 08, 1940  [name] is a 81 y.o. year old female who sees Redmond School, MD for primary care. I spoke with  [name] by phone today.  What matters to the patients health and wellness today?  Start home health physical therapy for balance and gait training s/p CVA    Goals Addressed             This Visit's Progress    Care Coordination Services       Care Coordination Interventions: Assessed social determinant of health barriers Assessed mobility and ability to perform ADLs Assessed family/Social support. Granddaughter lives with her and assists with ADLs and IADLs Provided patient/caregiver with verbal information on [place] [phone]) Scheduled a follow-up telephone call with Valente David, RN Care Coordinator [phone]) for 06/09/22 at 10:30. Provided with telephone number. Discussed patient's need for Physical Therapy. [facility] did an evaluation on 04/16/22 but they're waiting on information from Dr Fusco's office before they can begin PT Outreach to Dr Fusco's office to inquire about documentation/orders needed for home health PT and relayed that patient is waiting for them to complete the paperwork on their end before she can start PT          SDOH assessments and interventions completed:  Yes  SDOH Interventions Today    Flowsheet Row Most Recent Value  SDOH Interventions   Financial Strain Interventions Intervention Not Indicated  Housing Interventions Intervention Not Indicated  Transportation Interventions Intervention Not Indicated        Care Coordination Interventions Activated:  Yes  Care Coordination Interventions:  Yes, provided   Follow up plan: Follow up call scheduled for 06/09/22 at 10:30 with Valente David, RN Care Coordinator    Encounter Outcome:  Pt. Visit Completed   Chong Sicilian, BSN,  RN-BC [place] / [place] Direct Dial: [phone]

## 2022-05-13 DIAGNOSIS — I110 Hypertensive heart disease with heart failure: Secondary | ICD-10-CM | POA: Diagnosis not present

## 2022-05-13 DIAGNOSIS — A419 Sepsis, unspecified organism: Secondary | ICD-10-CM | POA: Diagnosis not present

## 2022-05-13 DIAGNOSIS — N390 Urinary tract infection, site not specified: Secondary | ICD-10-CM | POA: Diagnosis not present

## 2022-05-19 ENCOUNTER — Ambulatory Visit (INDEPENDENT_AMBULATORY_CARE_PROVIDER_SITE_OTHER): Payer: Medicare Other

## 2022-05-19 DIAGNOSIS — G4733 Obstructive sleep apnea (adult) (pediatric): Secondary | ICD-10-CM | POA: Diagnosis not present

## 2022-05-19 DIAGNOSIS — Z955 Presence of coronary angioplasty implant and graft: Secondary | ICD-10-CM | POA: Diagnosis not present

## 2022-05-19 DIAGNOSIS — D696 Thrombocytopenia, unspecified: Secondary | ICD-10-CM | POA: Diagnosis not present

## 2022-05-19 DIAGNOSIS — I5032 Chronic diastolic (congestive) heart failure: Secondary | ICD-10-CM | POA: Diagnosis not present

## 2022-05-19 DIAGNOSIS — K7460 Unspecified cirrhosis of liver: Secondary | ICD-10-CM | POA: Diagnosis not present

## 2022-05-19 DIAGNOSIS — E872 Acidosis, unspecified: Secondary | ICD-10-CM | POA: Diagnosis not present

## 2022-05-19 DIAGNOSIS — I2510 Atherosclerotic heart disease of native coronary artery without angina pectoris: Secondary | ICD-10-CM | POA: Diagnosis not present

## 2022-05-19 DIAGNOSIS — Z7901 Long term (current) use of anticoagulants: Secondary | ICD-10-CM | POA: Diagnosis not present

## 2022-05-19 DIAGNOSIS — A419 Sepsis, unspecified organism: Secondary | ICD-10-CM | POA: Diagnosis not present

## 2022-05-19 DIAGNOSIS — K219 Gastro-esophageal reflux disease without esophagitis: Secondary | ICD-10-CM | POA: Diagnosis not present

## 2022-05-19 DIAGNOSIS — F17200 Nicotine dependence, unspecified, uncomplicated: Secondary | ICD-10-CM | POA: Diagnosis not present

## 2022-05-19 DIAGNOSIS — Z8673 Personal history of transient ischemic attack (TIA), and cerebral infarction without residual deficits: Secondary | ICD-10-CM | POA: Diagnosis not present

## 2022-05-19 DIAGNOSIS — N390 Urinary tract infection, site not specified: Secondary | ICD-10-CM | POA: Diagnosis not present

## 2022-05-19 DIAGNOSIS — I639 Cerebral infarction, unspecified: Secondary | ICD-10-CM | POA: Diagnosis not present

## 2022-05-19 DIAGNOSIS — I350 Nonrheumatic aortic (valve) stenosis: Secondary | ICD-10-CM | POA: Diagnosis not present

## 2022-05-19 DIAGNOSIS — D649 Anemia, unspecified: Secondary | ICD-10-CM | POA: Diagnosis not present

## 2022-05-19 DIAGNOSIS — I480 Paroxysmal atrial fibrillation: Secondary | ICD-10-CM | POA: Diagnosis not present

## 2022-05-19 DIAGNOSIS — K449 Diaphragmatic hernia without obstruction or gangrene: Secondary | ICD-10-CM | POA: Diagnosis not present

## 2022-05-19 DIAGNOSIS — R1310 Dysphagia, unspecified: Secondary | ICD-10-CM | POA: Diagnosis not present

## 2022-05-19 DIAGNOSIS — E7800 Pure hypercholesterolemia, unspecified: Secondary | ICD-10-CM | POA: Diagnosis not present

## 2022-05-19 DIAGNOSIS — E119 Type 2 diabetes mellitus without complications: Secondary | ICD-10-CM | POA: Diagnosis not present

## 2022-05-19 DIAGNOSIS — I110 Hypertensive heart disease with heart failure: Secondary | ICD-10-CM | POA: Diagnosis not present

## 2022-05-19 DIAGNOSIS — K589 Irritable bowel syndrome without diarrhea: Secondary | ICD-10-CM | POA: Diagnosis not present

## 2022-05-20 LAB — CUP PACEART REMOTE DEVICE CHECK
Date Time Interrogation Session: 20230907232547
Implantable Pulse Generator Implant Date: 20210602

## 2022-05-20 NOTE — Progress Notes (Signed)
Carelink Summary Report / Loop Recorder 

## 2022-05-23 DIAGNOSIS — Z8673 Personal history of transient ischemic attack (TIA), and cerebral infarction without residual deficits: Secondary | ICD-10-CM | POA: Diagnosis not present

## 2022-05-23 DIAGNOSIS — I5032 Chronic diastolic (congestive) heart failure: Secondary | ICD-10-CM | POA: Diagnosis not present

## 2022-05-23 DIAGNOSIS — I110 Hypertensive heart disease with heart failure: Secondary | ICD-10-CM | POA: Diagnosis not present

## 2022-05-23 DIAGNOSIS — I480 Paroxysmal atrial fibrillation: Secondary | ICD-10-CM | POA: Diagnosis not present

## 2022-05-23 DIAGNOSIS — Z7901 Long term (current) use of anticoagulants: Secondary | ICD-10-CM | POA: Diagnosis not present

## 2022-05-23 DIAGNOSIS — N390 Urinary tract infection, site not specified: Secondary | ICD-10-CM | POA: Diagnosis not present

## 2022-05-23 DIAGNOSIS — I350 Nonrheumatic aortic (valve) stenosis: Secondary | ICD-10-CM | POA: Diagnosis not present

## 2022-05-23 DIAGNOSIS — E119 Type 2 diabetes mellitus without complications: Secondary | ICD-10-CM | POA: Diagnosis not present

## 2022-05-23 DIAGNOSIS — G4733 Obstructive sleep apnea (adult) (pediatric): Secondary | ICD-10-CM | POA: Diagnosis not present

## 2022-05-23 DIAGNOSIS — K449 Diaphragmatic hernia without obstruction or gangrene: Secondary | ICD-10-CM | POA: Diagnosis not present

## 2022-05-23 DIAGNOSIS — E7800 Pure hypercholesterolemia, unspecified: Secondary | ICD-10-CM | POA: Diagnosis not present

## 2022-05-23 DIAGNOSIS — E872 Acidosis, unspecified: Secondary | ICD-10-CM | POA: Diagnosis not present

## 2022-05-23 DIAGNOSIS — D649 Anemia, unspecified: Secondary | ICD-10-CM | POA: Diagnosis not present

## 2022-05-23 DIAGNOSIS — F17200 Nicotine dependence, unspecified, uncomplicated: Secondary | ICD-10-CM | POA: Diagnosis not present

## 2022-05-23 DIAGNOSIS — Z955 Presence of coronary angioplasty implant and graft: Secondary | ICD-10-CM | POA: Diagnosis not present

## 2022-05-23 DIAGNOSIS — D696 Thrombocytopenia, unspecified: Secondary | ICD-10-CM | POA: Diagnosis not present

## 2022-05-23 DIAGNOSIS — K219 Gastro-esophageal reflux disease without esophagitis: Secondary | ICD-10-CM | POA: Diagnosis not present

## 2022-05-23 DIAGNOSIS — R1310 Dysphagia, unspecified: Secondary | ICD-10-CM | POA: Diagnosis not present

## 2022-05-23 DIAGNOSIS — A419 Sepsis, unspecified organism: Secondary | ICD-10-CM | POA: Diagnosis not present

## 2022-05-23 DIAGNOSIS — K589 Irritable bowel syndrome without diarrhea: Secondary | ICD-10-CM | POA: Diagnosis not present

## 2022-05-23 DIAGNOSIS — I2510 Atherosclerotic heart disease of native coronary artery without angina pectoris: Secondary | ICD-10-CM | POA: Diagnosis not present

## 2022-05-23 DIAGNOSIS — K7460 Unspecified cirrhosis of liver: Secondary | ICD-10-CM | POA: Diagnosis not present

## 2022-05-27 DIAGNOSIS — E872 Acidosis, unspecified: Secondary | ICD-10-CM | POA: Diagnosis not present

## 2022-05-27 DIAGNOSIS — K219 Gastro-esophageal reflux disease without esophagitis: Secondary | ICD-10-CM | POA: Diagnosis not present

## 2022-05-27 DIAGNOSIS — K589 Irritable bowel syndrome without diarrhea: Secondary | ICD-10-CM | POA: Diagnosis not present

## 2022-05-27 DIAGNOSIS — K7460 Unspecified cirrhosis of liver: Secondary | ICD-10-CM | POA: Diagnosis not present

## 2022-05-27 DIAGNOSIS — E119 Type 2 diabetes mellitus without complications: Secondary | ICD-10-CM | POA: Diagnosis not present

## 2022-05-27 DIAGNOSIS — I110 Hypertensive heart disease with heart failure: Secondary | ICD-10-CM | POA: Diagnosis not present

## 2022-05-27 DIAGNOSIS — I2510 Atherosclerotic heart disease of native coronary artery without angina pectoris: Secondary | ICD-10-CM | POA: Diagnosis not present

## 2022-05-27 DIAGNOSIS — G4733 Obstructive sleep apnea (adult) (pediatric): Secondary | ICD-10-CM | POA: Diagnosis not present

## 2022-05-27 DIAGNOSIS — I350 Nonrheumatic aortic (valve) stenosis: Secondary | ICD-10-CM | POA: Diagnosis not present

## 2022-05-27 DIAGNOSIS — K449 Diaphragmatic hernia without obstruction or gangrene: Secondary | ICD-10-CM | POA: Diagnosis not present

## 2022-05-27 DIAGNOSIS — D649 Anemia, unspecified: Secondary | ICD-10-CM | POA: Diagnosis not present

## 2022-05-27 DIAGNOSIS — Z8673 Personal history of transient ischemic attack (TIA), and cerebral infarction without residual deficits: Secondary | ICD-10-CM | POA: Diagnosis not present

## 2022-05-27 DIAGNOSIS — R1310 Dysphagia, unspecified: Secondary | ICD-10-CM | POA: Diagnosis not present

## 2022-05-27 DIAGNOSIS — Z955 Presence of coronary angioplasty implant and graft: Secondary | ICD-10-CM | POA: Diagnosis not present

## 2022-05-27 DIAGNOSIS — D696 Thrombocytopenia, unspecified: Secondary | ICD-10-CM | POA: Diagnosis not present

## 2022-05-27 DIAGNOSIS — Z7901 Long term (current) use of anticoagulants: Secondary | ICD-10-CM | POA: Diagnosis not present

## 2022-05-27 DIAGNOSIS — A419 Sepsis, unspecified organism: Secondary | ICD-10-CM | POA: Diagnosis not present

## 2022-05-27 DIAGNOSIS — N390 Urinary tract infection, site not specified: Secondary | ICD-10-CM | POA: Diagnosis not present

## 2022-05-27 DIAGNOSIS — E7800 Pure hypercholesterolemia, unspecified: Secondary | ICD-10-CM | POA: Diagnosis not present

## 2022-05-27 DIAGNOSIS — I5032 Chronic diastolic (congestive) heart failure: Secondary | ICD-10-CM | POA: Diagnosis not present

## 2022-05-27 DIAGNOSIS — F17200 Nicotine dependence, unspecified, uncomplicated: Secondary | ICD-10-CM | POA: Diagnosis not present

## 2022-05-27 DIAGNOSIS — I480 Paroxysmal atrial fibrillation: Secondary | ICD-10-CM | POA: Diagnosis not present

## 2022-05-28 ENCOUNTER — Other Ambulatory Visit (HOSPITAL_COMMUNITY): Payer: Self-pay | Admitting: Internal Medicine

## 2022-05-28 DIAGNOSIS — R634 Abnormal weight loss: Secondary | ICD-10-CM | POA: Diagnosis not present

## 2022-05-28 DIAGNOSIS — Z0001 Encounter for general adult medical examination with abnormal findings: Secondary | ICD-10-CM | POA: Diagnosis not present

## 2022-05-28 DIAGNOSIS — I4891 Unspecified atrial fibrillation: Secondary | ICD-10-CM | POA: Diagnosis not present

## 2022-05-28 DIAGNOSIS — E559 Vitamin D deficiency, unspecified: Secondary | ICD-10-CM | POA: Diagnosis not present

## 2022-05-28 DIAGNOSIS — I10 Essential (primary) hypertension: Secondary | ICD-10-CM | POA: Diagnosis not present

## 2022-05-28 DIAGNOSIS — K7469 Other cirrhosis of liver: Secondary | ICD-10-CM | POA: Diagnosis not present

## 2022-05-28 DIAGNOSIS — Z23 Encounter for immunization: Secondary | ICD-10-CM | POA: Diagnosis not present

## 2022-05-28 DIAGNOSIS — D518 Other vitamin B12 deficiency anemias: Secondary | ICD-10-CM | POA: Diagnosis not present

## 2022-05-28 DIAGNOSIS — I6930 Unspecified sequelae of cerebral infarction: Secondary | ICD-10-CM | POA: Diagnosis not present

## 2022-05-28 DIAGNOSIS — E1140 Type 2 diabetes mellitus with diabetic neuropathy, unspecified: Secondary | ICD-10-CM | POA: Diagnosis not present

## 2022-05-28 DIAGNOSIS — R059 Cough, unspecified: Secondary | ICD-10-CM

## 2022-05-28 DIAGNOSIS — Z6822 Body mass index (BMI) 22.0-22.9, adult: Secondary | ICD-10-CM | POA: Diagnosis not present

## 2022-05-28 DIAGNOSIS — E039 Hypothyroidism, unspecified: Secondary | ICD-10-CM | POA: Diagnosis not present

## 2022-05-29 DIAGNOSIS — N390 Urinary tract infection, site not specified: Secondary | ICD-10-CM | POA: Diagnosis not present

## 2022-05-29 DIAGNOSIS — I350 Nonrheumatic aortic (valve) stenosis: Secondary | ICD-10-CM | POA: Diagnosis not present

## 2022-05-29 DIAGNOSIS — I2510 Atherosclerotic heart disease of native coronary artery without angina pectoris: Secondary | ICD-10-CM | POA: Diagnosis not present

## 2022-05-29 DIAGNOSIS — I110 Hypertensive heart disease with heart failure: Secondary | ICD-10-CM | POA: Diagnosis not present

## 2022-05-29 DIAGNOSIS — Z7901 Long term (current) use of anticoagulants: Secondary | ICD-10-CM | POA: Diagnosis not present

## 2022-05-29 DIAGNOSIS — D696 Thrombocytopenia, unspecified: Secondary | ICD-10-CM | POA: Diagnosis not present

## 2022-05-29 DIAGNOSIS — K219 Gastro-esophageal reflux disease without esophagitis: Secondary | ICD-10-CM | POA: Diagnosis not present

## 2022-05-29 DIAGNOSIS — K7460 Unspecified cirrhosis of liver: Secondary | ICD-10-CM | POA: Diagnosis not present

## 2022-05-29 DIAGNOSIS — E7800 Pure hypercholesterolemia, unspecified: Secondary | ICD-10-CM | POA: Diagnosis not present

## 2022-05-29 DIAGNOSIS — A419 Sepsis, unspecified organism: Secondary | ICD-10-CM | POA: Diagnosis not present

## 2022-05-29 DIAGNOSIS — Z8673 Personal history of transient ischemic attack (TIA), and cerebral infarction without residual deficits: Secondary | ICD-10-CM | POA: Diagnosis not present

## 2022-05-29 DIAGNOSIS — F17200 Nicotine dependence, unspecified, uncomplicated: Secondary | ICD-10-CM | POA: Diagnosis not present

## 2022-05-29 DIAGNOSIS — K589 Irritable bowel syndrome without diarrhea: Secondary | ICD-10-CM | POA: Diagnosis not present

## 2022-05-29 DIAGNOSIS — K449 Diaphragmatic hernia without obstruction or gangrene: Secondary | ICD-10-CM | POA: Diagnosis not present

## 2022-05-29 DIAGNOSIS — I5032 Chronic diastolic (congestive) heart failure: Secondary | ICD-10-CM | POA: Diagnosis not present

## 2022-05-29 DIAGNOSIS — G4733 Obstructive sleep apnea (adult) (pediatric): Secondary | ICD-10-CM | POA: Diagnosis not present

## 2022-05-29 DIAGNOSIS — E872 Acidosis, unspecified: Secondary | ICD-10-CM | POA: Diagnosis not present

## 2022-05-29 DIAGNOSIS — E119 Type 2 diabetes mellitus without complications: Secondary | ICD-10-CM | POA: Diagnosis not present

## 2022-05-29 DIAGNOSIS — Z955 Presence of coronary angioplasty implant and graft: Secondary | ICD-10-CM | POA: Diagnosis not present

## 2022-05-29 DIAGNOSIS — R1310 Dysphagia, unspecified: Secondary | ICD-10-CM | POA: Diagnosis not present

## 2022-05-29 DIAGNOSIS — D649 Anemia, unspecified: Secondary | ICD-10-CM | POA: Diagnosis not present

## 2022-05-29 DIAGNOSIS — I480 Paroxysmal atrial fibrillation: Secondary | ICD-10-CM | POA: Diagnosis not present

## 2022-06-02 DIAGNOSIS — G4733 Obstructive sleep apnea (adult) (pediatric): Secondary | ICD-10-CM | POA: Diagnosis not present

## 2022-06-02 DIAGNOSIS — E119 Type 2 diabetes mellitus without complications: Secondary | ICD-10-CM | POA: Diagnosis not present

## 2022-06-02 DIAGNOSIS — K7460 Unspecified cirrhosis of liver: Secondary | ICD-10-CM | POA: Diagnosis not present

## 2022-06-02 DIAGNOSIS — K589 Irritable bowel syndrome without diarrhea: Secondary | ICD-10-CM | POA: Diagnosis not present

## 2022-06-02 DIAGNOSIS — I2510 Atherosclerotic heart disease of native coronary artery without angina pectoris: Secondary | ICD-10-CM | POA: Diagnosis not present

## 2022-06-02 DIAGNOSIS — I5032 Chronic diastolic (congestive) heart failure: Secondary | ICD-10-CM | POA: Diagnosis not present

## 2022-06-02 DIAGNOSIS — K219 Gastro-esophageal reflux disease without esophagitis: Secondary | ICD-10-CM | POA: Diagnosis not present

## 2022-06-02 DIAGNOSIS — R1310 Dysphagia, unspecified: Secondary | ICD-10-CM | POA: Diagnosis not present

## 2022-06-02 DIAGNOSIS — A419 Sepsis, unspecified organism: Secondary | ICD-10-CM | POA: Diagnosis not present

## 2022-06-02 DIAGNOSIS — Z955 Presence of coronary angioplasty implant and graft: Secondary | ICD-10-CM | POA: Diagnosis not present

## 2022-06-02 DIAGNOSIS — I110 Hypertensive heart disease with heart failure: Secondary | ICD-10-CM | POA: Diagnosis not present

## 2022-06-02 DIAGNOSIS — Z7901 Long term (current) use of anticoagulants: Secondary | ICD-10-CM | POA: Diagnosis not present

## 2022-06-02 DIAGNOSIS — N390 Urinary tract infection, site not specified: Secondary | ICD-10-CM | POA: Diagnosis not present

## 2022-06-02 DIAGNOSIS — Z8673 Personal history of transient ischemic attack (TIA), and cerebral infarction without residual deficits: Secondary | ICD-10-CM | POA: Diagnosis not present

## 2022-06-02 DIAGNOSIS — I350 Nonrheumatic aortic (valve) stenosis: Secondary | ICD-10-CM | POA: Diagnosis not present

## 2022-06-02 DIAGNOSIS — E7800 Pure hypercholesterolemia, unspecified: Secondary | ICD-10-CM | POA: Diagnosis not present

## 2022-06-02 DIAGNOSIS — I480 Paroxysmal atrial fibrillation: Secondary | ICD-10-CM | POA: Diagnosis not present

## 2022-06-02 DIAGNOSIS — D696 Thrombocytopenia, unspecified: Secondary | ICD-10-CM | POA: Diagnosis not present

## 2022-06-02 DIAGNOSIS — K449 Diaphragmatic hernia without obstruction or gangrene: Secondary | ICD-10-CM | POA: Diagnosis not present

## 2022-06-02 DIAGNOSIS — F17200 Nicotine dependence, unspecified, uncomplicated: Secondary | ICD-10-CM | POA: Diagnosis not present

## 2022-06-02 DIAGNOSIS — E872 Acidosis, unspecified: Secondary | ICD-10-CM | POA: Diagnosis not present

## 2022-06-02 DIAGNOSIS — D649 Anemia, unspecified: Secondary | ICD-10-CM | POA: Diagnosis not present

## 2022-06-03 ENCOUNTER — Ambulatory Visit (HOSPITAL_COMMUNITY)
Admission: RE | Admit: 2022-06-03 | Discharge: 2022-06-03 | Disposition: A | Discharge status: Home / Self Care | Payer: Medicare Other | Source: Ambulatory Visit | Attending: Internal Medicine | Admitting: Internal Medicine

## 2022-06-03 DIAGNOSIS — R059 Cough, unspecified: Secondary | ICD-10-CM | POA: Diagnosis not present

## 2022-06-03 DIAGNOSIS — J90 Pleural effusion, not elsewhere classified: Secondary | ICD-10-CM | POA: Diagnosis not present

## 2022-06-05 DIAGNOSIS — I480 Paroxysmal atrial fibrillation: Secondary | ICD-10-CM | POA: Diagnosis not present

## 2022-06-05 DIAGNOSIS — E872 Acidosis, unspecified: Secondary | ICD-10-CM | POA: Diagnosis not present

## 2022-06-05 DIAGNOSIS — R1310 Dysphagia, unspecified: Secondary | ICD-10-CM | POA: Diagnosis not present

## 2022-06-05 DIAGNOSIS — Z7901 Long term (current) use of anticoagulants: Secondary | ICD-10-CM | POA: Diagnosis not present

## 2022-06-05 DIAGNOSIS — K7460 Unspecified cirrhosis of liver: Secondary | ICD-10-CM | POA: Diagnosis not present

## 2022-06-05 DIAGNOSIS — K219 Gastro-esophageal reflux disease without esophagitis: Secondary | ICD-10-CM | POA: Diagnosis not present

## 2022-06-05 DIAGNOSIS — D649 Anemia, unspecified: Secondary | ICD-10-CM | POA: Diagnosis not present

## 2022-06-05 DIAGNOSIS — K449 Diaphragmatic hernia without obstruction or gangrene: Secondary | ICD-10-CM | POA: Diagnosis not present

## 2022-06-05 DIAGNOSIS — E7800 Pure hypercholesterolemia, unspecified: Secondary | ICD-10-CM | POA: Diagnosis not present

## 2022-06-05 DIAGNOSIS — F17200 Nicotine dependence, unspecified, uncomplicated: Secondary | ICD-10-CM | POA: Diagnosis not present

## 2022-06-05 DIAGNOSIS — D696 Thrombocytopenia, unspecified: Secondary | ICD-10-CM | POA: Diagnosis not present

## 2022-06-05 DIAGNOSIS — G4733 Obstructive sleep apnea (adult) (pediatric): Secondary | ICD-10-CM | POA: Diagnosis not present

## 2022-06-05 DIAGNOSIS — I350 Nonrheumatic aortic (valve) stenosis: Secondary | ICD-10-CM | POA: Diagnosis not present

## 2022-06-05 DIAGNOSIS — A419 Sepsis, unspecified organism: Secondary | ICD-10-CM | POA: Diagnosis not present

## 2022-06-05 DIAGNOSIS — K589 Irritable bowel syndrome without diarrhea: Secondary | ICD-10-CM | POA: Diagnosis not present

## 2022-06-05 DIAGNOSIS — Z8673 Personal history of transient ischemic attack (TIA), and cerebral infarction without residual deficits: Secondary | ICD-10-CM | POA: Diagnosis not present

## 2022-06-05 DIAGNOSIS — I2510 Atherosclerotic heart disease of native coronary artery without angina pectoris: Secondary | ICD-10-CM | POA: Diagnosis not present

## 2022-06-05 DIAGNOSIS — Z955 Presence of coronary angioplasty implant and graft: Secondary | ICD-10-CM | POA: Diagnosis not present

## 2022-06-05 DIAGNOSIS — E119 Type 2 diabetes mellitus without complications: Secondary | ICD-10-CM | POA: Diagnosis not present

## 2022-06-05 DIAGNOSIS — I110 Hypertensive heart disease with heart failure: Secondary | ICD-10-CM | POA: Diagnosis not present

## 2022-06-05 DIAGNOSIS — I5032 Chronic diastolic (congestive) heart failure: Secondary | ICD-10-CM | POA: Diagnosis not present

## 2022-06-05 DIAGNOSIS — N390 Urinary tract infection, site not specified: Secondary | ICD-10-CM | POA: Diagnosis not present

## 2022-06-05 NOTE — Progress Notes (Signed)
Carelink Summary Report / Loop Recorder 

## 2022-06-09 ENCOUNTER — Encounter: Payer: Self-pay | Admitting: *Deleted

## 2022-06-09 ENCOUNTER — Ambulatory Visit: Payer: Self-pay | Admitting: *Deleted

## 2022-06-09 NOTE — Patient Outreach (Signed)
  Care Coordination   Follow Up Visit Note   06/09/2022 Name: [name] MRN: [id number] DOB: 25-May-1941  [name] is a 81 y.o. year old female who sees Redmond [facility], MD for primary care. I  Spoke with granddaughter, and caregiver, by telephone.  What matters to the patients health and wellness today?  Continuing home health PT    Goals Addressed             This Visit's Progress    Care Coordination Services       Care Coordination Interventions: Assessed social determinant of health barriers Spoke with patient's granddaughter, who lives in the home. Patient was asleep at the time of call and a later appointment time was requested. Discussed patient's need for Physical Therapy and prior outreach by this [facility] to Dr Fusco's office regarding [facility] orders. [facility] has been coming out to the home. There was some confusion about the orders, which delayed care, but that has been resolved. They are now waiting to see if PT services can be extended No other needs identified at this visit. Will defer additional intervention assessment until next appointment when patient is able to talk with [facility] Scheduled a follow-up telephone call with Valente David, RN Care Coordinator [phone]) for 06/16/22 at 1:30. Provided with telephone number.          SDOH assessments and interventions completed:  No. Completed at last visit.    Care Coordination Interventions Activated:  Yes  Care Coordination Interventions:  Yes, provided   Follow up plan: Follow up call scheduled for 06/16/22 at 1:30 with Valente David, RN Care Coordinator [phone])   Encounter Outcome:  Pt. Visit Completed   Chong Sicilian, BSN, RN-BC RN Care Coordinator [place]: [phone] Main #: [phone]

## 2022-06-10 DIAGNOSIS — G4733 Obstructive sleep apnea (adult) (pediatric): Secondary | ICD-10-CM | POA: Diagnosis not present

## 2022-06-10 DIAGNOSIS — F17200 Nicotine dependence, unspecified, uncomplicated: Secondary | ICD-10-CM | POA: Diagnosis not present

## 2022-06-10 DIAGNOSIS — A419 Sepsis, unspecified organism: Secondary | ICD-10-CM | POA: Diagnosis not present

## 2022-06-10 DIAGNOSIS — I110 Hypertensive heart disease with heart failure: Secondary | ICD-10-CM | POA: Diagnosis not present

## 2022-06-10 DIAGNOSIS — K589 Irritable bowel syndrome without diarrhea: Secondary | ICD-10-CM | POA: Diagnosis not present

## 2022-06-10 DIAGNOSIS — I5032 Chronic diastolic (congestive) heart failure: Secondary | ICD-10-CM | POA: Diagnosis not present

## 2022-06-10 DIAGNOSIS — Z8673 Personal history of transient ischemic attack (TIA), and cerebral infarction without residual deficits: Secondary | ICD-10-CM | POA: Diagnosis not present

## 2022-06-10 DIAGNOSIS — K449 Diaphragmatic hernia without obstruction or gangrene: Secondary | ICD-10-CM | POA: Diagnosis not present

## 2022-06-10 DIAGNOSIS — K7460 Unspecified cirrhosis of liver: Secondary | ICD-10-CM | POA: Diagnosis not present

## 2022-06-10 DIAGNOSIS — I350 Nonrheumatic aortic (valve) stenosis: Secondary | ICD-10-CM | POA: Diagnosis not present

## 2022-06-10 DIAGNOSIS — E872 Acidosis, unspecified: Secondary | ICD-10-CM | POA: Diagnosis not present

## 2022-06-10 DIAGNOSIS — Z955 Presence of coronary angioplasty implant and graft: Secondary | ICD-10-CM | POA: Diagnosis not present

## 2022-06-10 DIAGNOSIS — D649 Anemia, unspecified: Secondary | ICD-10-CM | POA: Diagnosis not present

## 2022-06-10 DIAGNOSIS — E119 Type 2 diabetes mellitus without complications: Secondary | ICD-10-CM | POA: Diagnosis not present

## 2022-06-10 DIAGNOSIS — I2510 Atherosclerotic heart disease of native coronary artery without angina pectoris: Secondary | ICD-10-CM | POA: Diagnosis not present

## 2022-06-10 DIAGNOSIS — R1310 Dysphagia, unspecified: Secondary | ICD-10-CM | POA: Diagnosis not present

## 2022-06-10 DIAGNOSIS — I480 Paroxysmal atrial fibrillation: Secondary | ICD-10-CM | POA: Diagnosis not present

## 2022-06-10 DIAGNOSIS — K219 Gastro-esophageal reflux disease without esophagitis: Secondary | ICD-10-CM | POA: Diagnosis not present

## 2022-06-10 DIAGNOSIS — D696 Thrombocytopenia, unspecified: Secondary | ICD-10-CM | POA: Diagnosis not present

## 2022-06-10 DIAGNOSIS — Z7901 Long term (current) use of anticoagulants: Secondary | ICD-10-CM | POA: Diagnosis not present

## 2022-06-10 DIAGNOSIS — E7800 Pure hypercholesterolemia, unspecified: Secondary | ICD-10-CM | POA: Diagnosis not present

## 2022-06-10 DIAGNOSIS — N390 Urinary tract infection, site not specified: Secondary | ICD-10-CM | POA: Diagnosis not present

## 2022-06-12 DIAGNOSIS — I110 Hypertensive heart disease with heart failure: Secondary | ICD-10-CM | POA: Diagnosis not present

## 2022-06-12 DIAGNOSIS — K219 Gastro-esophageal reflux disease without esophagitis: Secondary | ICD-10-CM | POA: Diagnosis not present

## 2022-06-12 DIAGNOSIS — Z8673 Personal history of transient ischemic attack (TIA), and cerebral infarction without residual deficits: Secondary | ICD-10-CM | POA: Diagnosis not present

## 2022-06-12 DIAGNOSIS — R1310 Dysphagia, unspecified: Secondary | ICD-10-CM | POA: Diagnosis not present

## 2022-06-12 DIAGNOSIS — Z955 Presence of coronary angioplasty implant and graft: Secondary | ICD-10-CM | POA: Diagnosis not present

## 2022-06-12 DIAGNOSIS — D649 Anemia, unspecified: Secondary | ICD-10-CM | POA: Diagnosis not present

## 2022-06-12 DIAGNOSIS — I350 Nonrheumatic aortic (valve) stenosis: Secondary | ICD-10-CM | POA: Diagnosis not present

## 2022-06-12 DIAGNOSIS — E872 Acidosis, unspecified: Secondary | ICD-10-CM | POA: Diagnosis not present

## 2022-06-12 DIAGNOSIS — I2510 Atherosclerotic heart disease of native coronary artery without angina pectoris: Secondary | ICD-10-CM | POA: Diagnosis not present

## 2022-06-12 DIAGNOSIS — F17200 Nicotine dependence, unspecified, uncomplicated: Secondary | ICD-10-CM | POA: Diagnosis not present

## 2022-06-12 DIAGNOSIS — K589 Irritable bowel syndrome without diarrhea: Secondary | ICD-10-CM | POA: Diagnosis not present

## 2022-06-12 DIAGNOSIS — K449 Diaphragmatic hernia without obstruction or gangrene: Secondary | ICD-10-CM | POA: Diagnosis not present

## 2022-06-12 DIAGNOSIS — N390 Urinary tract infection, site not specified: Secondary | ICD-10-CM | POA: Diagnosis not present

## 2022-06-12 DIAGNOSIS — Z7901 Long term (current) use of anticoagulants: Secondary | ICD-10-CM | POA: Diagnosis not present

## 2022-06-12 DIAGNOSIS — I5032 Chronic diastolic (congestive) heart failure: Secondary | ICD-10-CM | POA: Diagnosis not present

## 2022-06-12 DIAGNOSIS — D696 Thrombocytopenia, unspecified: Secondary | ICD-10-CM | POA: Diagnosis not present

## 2022-06-12 DIAGNOSIS — K7460 Unspecified cirrhosis of liver: Secondary | ICD-10-CM | POA: Diagnosis not present

## 2022-06-12 DIAGNOSIS — G4733 Obstructive sleep apnea (adult) (pediatric): Secondary | ICD-10-CM | POA: Diagnosis not present

## 2022-06-12 DIAGNOSIS — E7800 Pure hypercholesterolemia, unspecified: Secondary | ICD-10-CM | POA: Diagnosis not present

## 2022-06-12 DIAGNOSIS — I480 Paroxysmal atrial fibrillation: Secondary | ICD-10-CM | POA: Diagnosis not present

## 2022-06-12 DIAGNOSIS — E119 Type 2 diabetes mellitus without complications: Secondary | ICD-10-CM | POA: Diagnosis not present

## 2022-06-12 DIAGNOSIS — A419 Sepsis, unspecified organism: Secondary | ICD-10-CM | POA: Diagnosis not present

## 2022-06-16 ENCOUNTER — Ambulatory Visit: Payer: Self-pay | Admitting: *Deleted

## 2022-06-16 NOTE — Patient Instructions (Signed)
Visit Information  Thank you for taking time to visit with me today. Please don't hesitate to contact me if I can be of assistance to you before our next scheduled telephone appointment.  Following are the goals we discussed today:  Adhere to Home health instructions for PT to increase strength  Our next appointment is by telephone on 11/6  Please call the care guide team at [phone] if you need to cancel or reschedule your appointment.   Please call the Suicide and Crisis Lifeline: 988 call the [place] National Suicide Prevention Lifeline: 6784267651 or TTY: 534-679-1121 TTY 907-260-7510) to talk to a trained counselor call 1-800-273-TALK (toll free, 24 hour hotline) call the [facility]: [phone] call 911 if you are experiencing a Mental Health or [place] or need someone to talk to.  Patient verbalizes understanding of instructions and care plan provided today and agrees to view in [place]. Active MyChart status and patient understanding of how to access instructions and care plan via MyChart confirmed with patient.     The patient has been provided with contact information for the care management team and has been advised to call with any health related questions or concerns.   Valente David, RN, MSN, [place] Care Management Care Management Coordinator [phone]

## 2022-06-16 NOTE — Patient Outreach (Signed)
  Care Coordination   Follow Up Visit Note   06/16/2022 Name: [name] MRN: [id number] DOB: 08-Feb-1941  [name] is a 81 y.o. year old female who sees Redmond School, MD for primary care. I spoke with  [name] by phone today.  What matters to the patients health and wellness today?  Report she is recovering well, still has not heard from [facility] for PT.  Denies any urgent concerns, encouraged to contact this care manager with questions.      Goals Addressed             This Visit's Progress    Care Coordination Services   On track    Care Coordination Interventions: Evaluation of current treatment plan related to ongoing recovery from stroke and UTI and patient's adherence to plan as established by provider Reviewed scheduled/upcoming provider appointments including appointment with PCP on Thursday and with GI on 10/24  Discussed plans with patient for ongoing care management follow up and provided patient with direct contact information for care management team Call placed to [facility] to follow up on restarting PT sessions.  They have received paperwork and will restart services on Wednesday          SDOH assessments and interventions completed:  No     Care Coordination Interventions Activated:  Yes  Care Coordination Interventions:  Yes, provided   Follow up plan: Follow up call scheduled for 11/6    Encounter Outcome:  Pt. Visit Completed   Valente David, RN, MSN, [place] Care Management Care Management Coordinator [phone]

## 2022-06-18 DIAGNOSIS — D696 Thrombocytopenia, unspecified: Secondary | ICD-10-CM | POA: Diagnosis not present

## 2022-06-18 DIAGNOSIS — K7460 Unspecified cirrhosis of liver: Secondary | ICD-10-CM | POA: Diagnosis not present

## 2022-06-18 DIAGNOSIS — N390 Urinary tract infection, site not specified: Secondary | ICD-10-CM | POA: Diagnosis not present

## 2022-06-18 DIAGNOSIS — Z955 Presence of coronary angioplasty implant and graft: Secondary | ICD-10-CM | POA: Diagnosis not present

## 2022-06-18 DIAGNOSIS — D649 Anemia, unspecified: Secondary | ICD-10-CM | POA: Diagnosis not present

## 2022-06-18 DIAGNOSIS — G4733 Obstructive sleep apnea (adult) (pediatric): Secondary | ICD-10-CM | POA: Diagnosis not present

## 2022-06-18 DIAGNOSIS — I2510 Atherosclerotic heart disease of native coronary artery without angina pectoris: Secondary | ICD-10-CM | POA: Diagnosis not present

## 2022-06-18 DIAGNOSIS — I480 Paroxysmal atrial fibrillation: Secondary | ICD-10-CM | POA: Diagnosis not present

## 2022-06-18 DIAGNOSIS — E119 Type 2 diabetes mellitus without complications: Secondary | ICD-10-CM | POA: Diagnosis not present

## 2022-06-18 DIAGNOSIS — K219 Gastro-esophageal reflux disease without esophagitis: Secondary | ICD-10-CM | POA: Diagnosis not present

## 2022-06-18 DIAGNOSIS — E7800 Pure hypercholesterolemia, unspecified: Secondary | ICD-10-CM | POA: Diagnosis not present

## 2022-06-18 DIAGNOSIS — R1310 Dysphagia, unspecified: Secondary | ICD-10-CM | POA: Diagnosis not present

## 2022-06-18 DIAGNOSIS — F17200 Nicotine dependence, unspecified, uncomplicated: Secondary | ICD-10-CM | POA: Diagnosis not present

## 2022-06-18 DIAGNOSIS — I110 Hypertensive heart disease with heart failure: Secondary | ICD-10-CM | POA: Diagnosis not present

## 2022-06-18 DIAGNOSIS — K589 Irritable bowel syndrome without diarrhea: Secondary | ICD-10-CM | POA: Diagnosis not present

## 2022-06-18 DIAGNOSIS — Z7901 Long term (current) use of anticoagulants: Secondary | ICD-10-CM | POA: Diagnosis not present

## 2022-06-18 DIAGNOSIS — A419 Sepsis, unspecified organism: Secondary | ICD-10-CM | POA: Diagnosis not present

## 2022-06-18 DIAGNOSIS — K449 Diaphragmatic hernia without obstruction or gangrene: Secondary | ICD-10-CM | POA: Diagnosis not present

## 2022-06-18 DIAGNOSIS — I5032 Chronic diastolic (congestive) heart failure: Secondary | ICD-10-CM | POA: Diagnosis not present

## 2022-06-18 DIAGNOSIS — Z8673 Personal history of transient ischemic attack (TIA), and cerebral infarction without residual deficits: Secondary | ICD-10-CM | POA: Diagnosis not present

## 2022-06-18 DIAGNOSIS — I350 Nonrheumatic aortic (valve) stenosis: Secondary | ICD-10-CM | POA: Diagnosis not present

## 2022-06-19 LAB — CUP PACEART REMOTE DEVICE CHECK
Date Time Interrogation Session: 20231010232604
Implantable Pulse Generator Implant Date: 20210602

## 2022-06-23 ENCOUNTER — Ambulatory Visit (INDEPENDENT_AMBULATORY_CARE_PROVIDER_SITE_OTHER): Payer: Medicare Other

## 2022-06-23 DIAGNOSIS — I639 Cerebral infarction, unspecified: Secondary | ICD-10-CM

## 2022-06-24 DIAGNOSIS — K7460 Unspecified cirrhosis of liver: Secondary | ICD-10-CM | POA: Diagnosis not present

## 2022-06-24 DIAGNOSIS — K449 Diaphragmatic hernia without obstruction or gangrene: Secondary | ICD-10-CM | POA: Diagnosis not present

## 2022-06-24 DIAGNOSIS — F17200 Nicotine dependence, unspecified, uncomplicated: Secondary | ICD-10-CM | POA: Diagnosis not present

## 2022-06-24 DIAGNOSIS — A419 Sepsis, unspecified organism: Secondary | ICD-10-CM | POA: Diagnosis not present

## 2022-06-24 DIAGNOSIS — D649 Anemia, unspecified: Secondary | ICD-10-CM | POA: Diagnosis not present

## 2022-06-24 DIAGNOSIS — E119 Type 2 diabetes mellitus without complications: Secondary | ICD-10-CM | POA: Diagnosis not present

## 2022-06-24 DIAGNOSIS — N390 Urinary tract infection, site not specified: Secondary | ICD-10-CM | POA: Diagnosis not present

## 2022-06-24 DIAGNOSIS — I5032 Chronic diastolic (congestive) heart failure: Secondary | ICD-10-CM | POA: Diagnosis not present

## 2022-06-24 DIAGNOSIS — G4733 Obstructive sleep apnea (adult) (pediatric): Secondary | ICD-10-CM | POA: Diagnosis not present

## 2022-06-24 DIAGNOSIS — I350 Nonrheumatic aortic (valve) stenosis: Secondary | ICD-10-CM | POA: Diagnosis not present

## 2022-06-24 DIAGNOSIS — D696 Thrombocytopenia, unspecified: Secondary | ICD-10-CM | POA: Diagnosis not present

## 2022-06-24 DIAGNOSIS — K589 Irritable bowel syndrome without diarrhea: Secondary | ICD-10-CM | POA: Diagnosis not present

## 2022-06-24 DIAGNOSIS — Z8673 Personal history of transient ischemic attack (TIA), and cerebral infarction without residual deficits: Secondary | ICD-10-CM | POA: Diagnosis not present

## 2022-06-24 DIAGNOSIS — I2510 Atherosclerotic heart disease of native coronary artery without angina pectoris: Secondary | ICD-10-CM | POA: Diagnosis not present

## 2022-06-24 DIAGNOSIS — I110 Hypertensive heart disease with heart failure: Secondary | ICD-10-CM | POA: Diagnosis not present

## 2022-06-24 DIAGNOSIS — E7800 Pure hypercholesterolemia, unspecified: Secondary | ICD-10-CM | POA: Diagnosis not present

## 2022-06-24 DIAGNOSIS — R1310 Dysphagia, unspecified: Secondary | ICD-10-CM | POA: Diagnosis not present

## 2022-06-24 DIAGNOSIS — Z7901 Long term (current) use of anticoagulants: Secondary | ICD-10-CM | POA: Diagnosis not present

## 2022-06-24 DIAGNOSIS — Z955 Presence of coronary angioplasty implant and graft: Secondary | ICD-10-CM | POA: Diagnosis not present

## 2022-06-24 DIAGNOSIS — I480 Paroxysmal atrial fibrillation: Secondary | ICD-10-CM | POA: Diagnosis not present

## 2022-06-24 DIAGNOSIS — K219 Gastro-esophageal reflux disease without esophagitis: Secondary | ICD-10-CM | POA: Diagnosis not present

## 2022-06-28 IMAGING — CT CT HEAD CODE STROKE
3 of 4 series · 14 of 47 positions shown, 16 images · non-contrast
Comparison: Prior CT from 01/18/2020.

CLINICAL DATA: Code stroke. Initial evaluation for acute neuro
deficit, stroke.



[Series 2: head w o · axial · 0.47mm/px · z∈[+55,+180]mm · 8 of 31 slices shown, 10 images]
[im 3/31  brain]
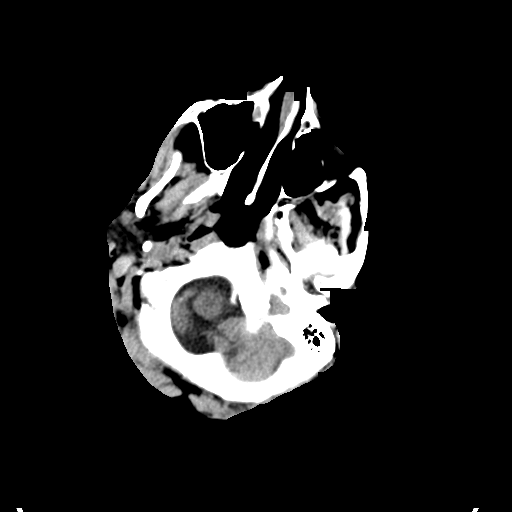
[im 3/31  bone]
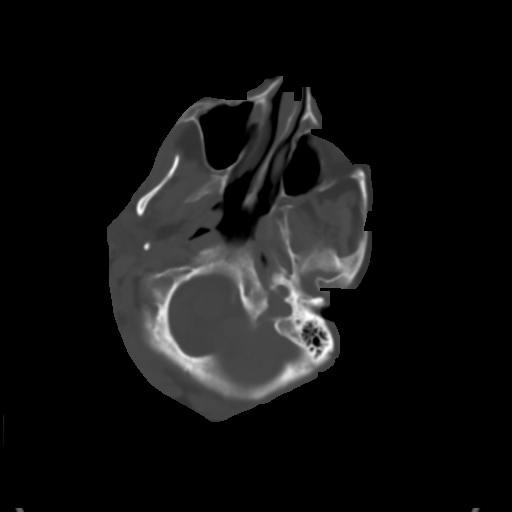
[im 7/31  brain]
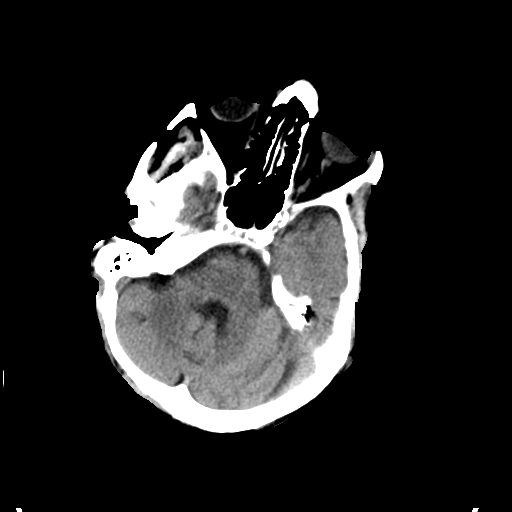
[im 11/31  brain]
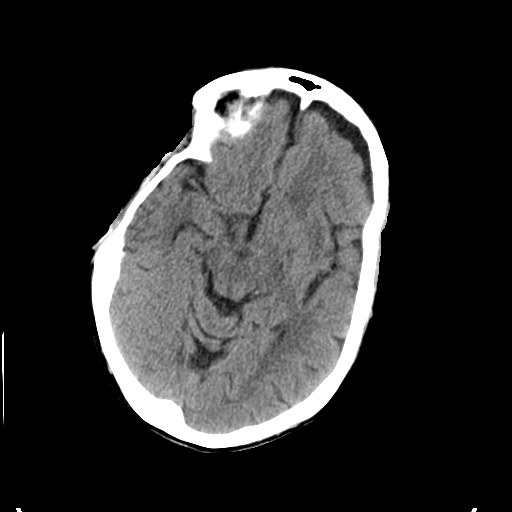
[im 13/31  brain]
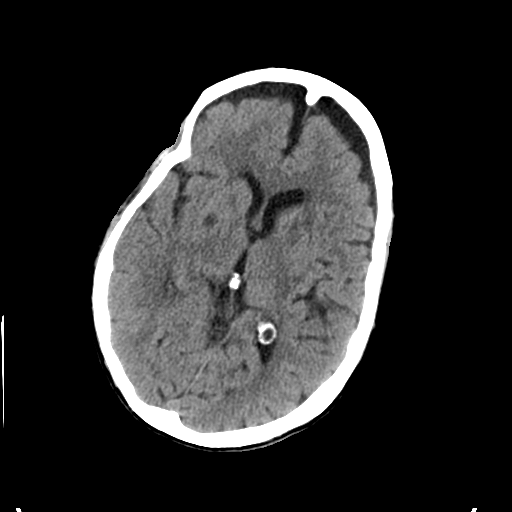
[im 18/31  brain]
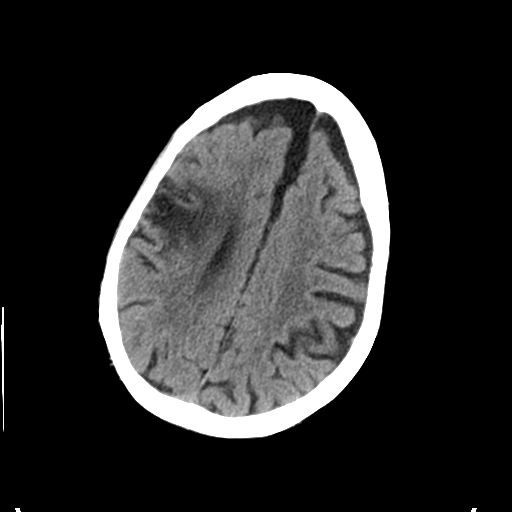
[im 18/31  bone]
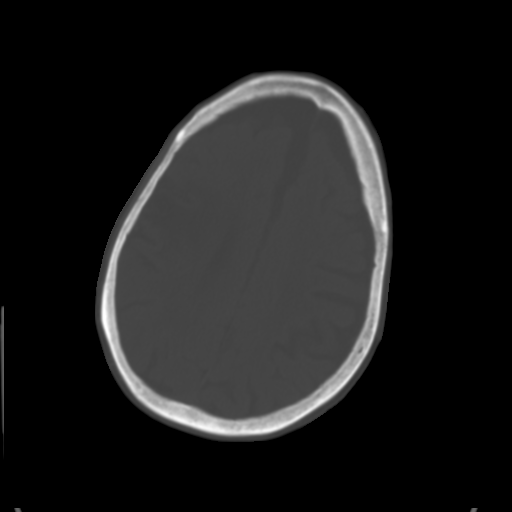
[im 20/31  brain]
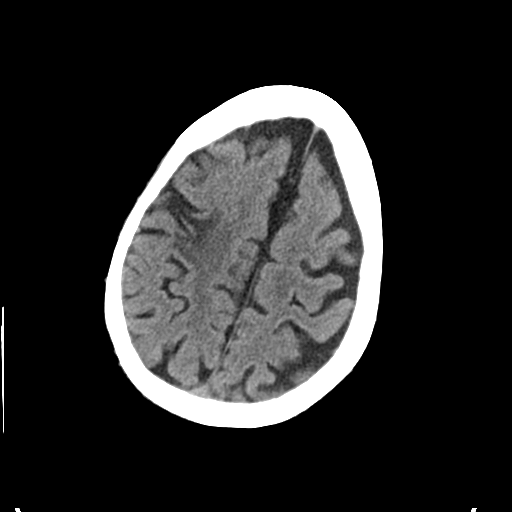
[im 24/31  brain]
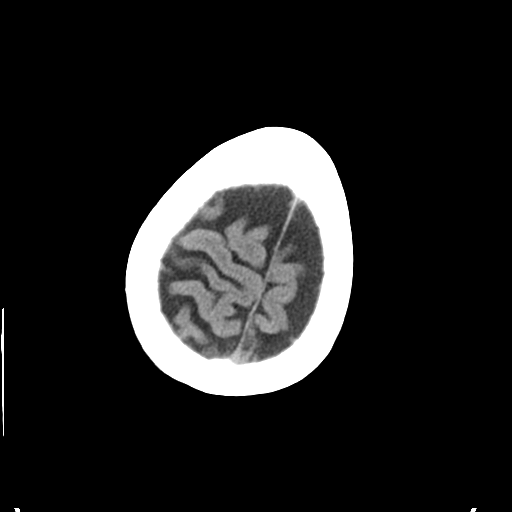
[im 28/31  brain]
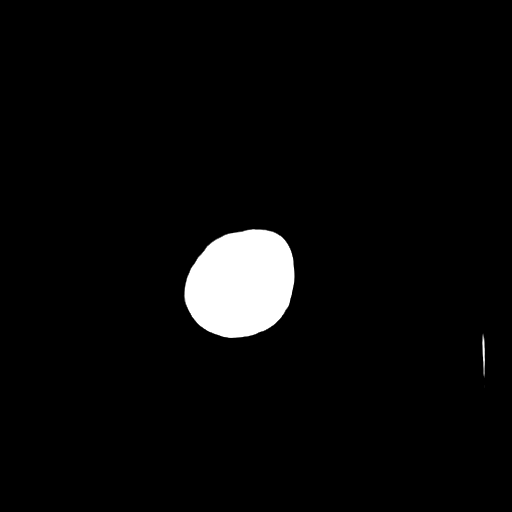

[Series 4: coronal soft · coronal · 0.29mm/px · 3 of 67 slices shown]
[im 23/67  brain]
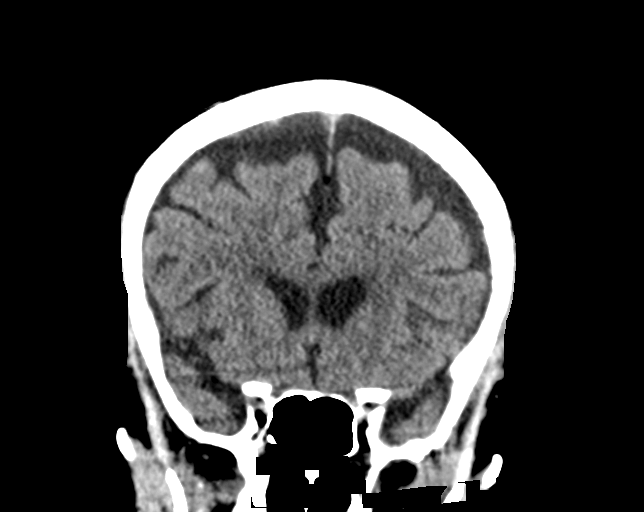
[im 30/67  brain]
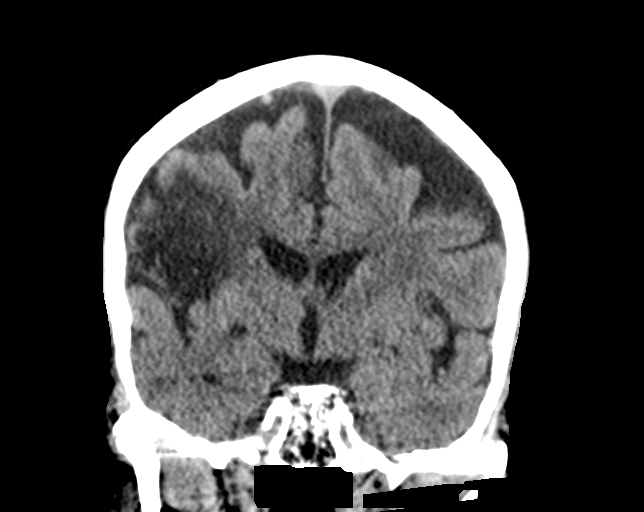
[im 37/67  brain]
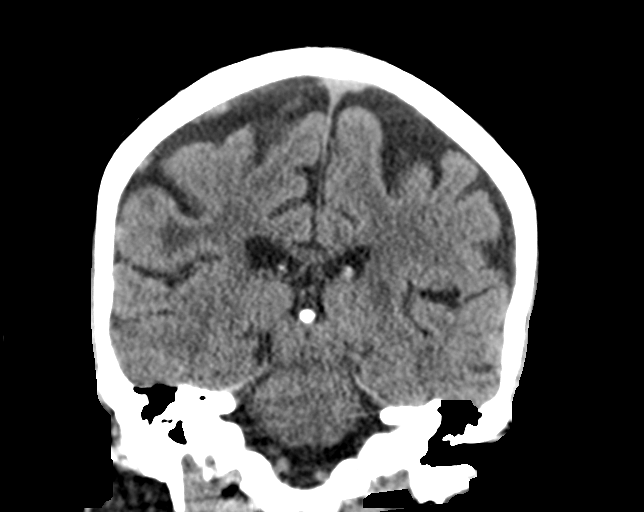

[Series 5: sagittal soft · sagittal · 0.30mm/px · 3 of 48 slices shown]
[im 16/48  brain]
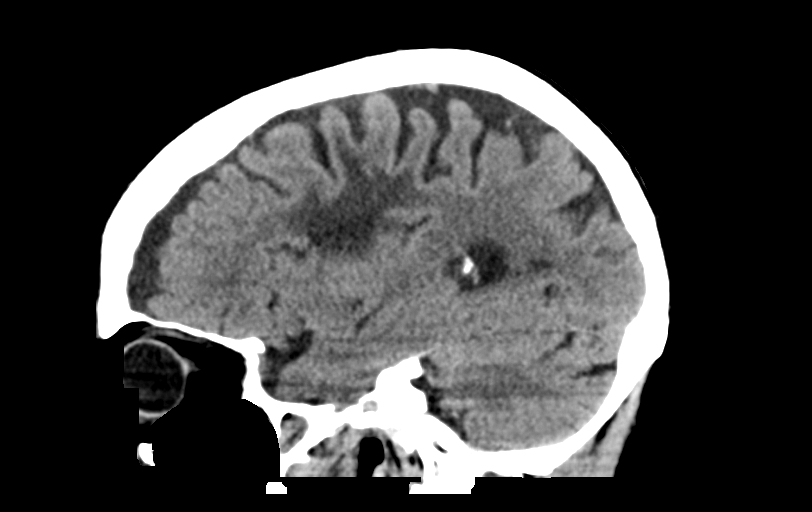
[im 24/48  brain]
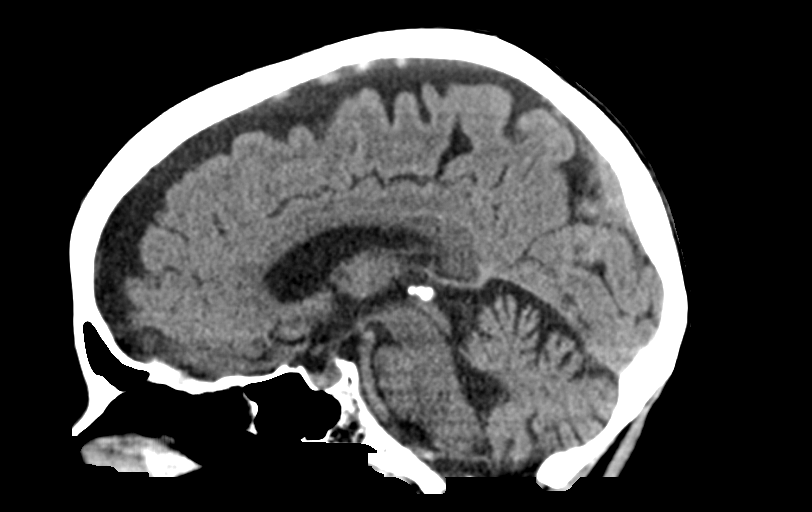
[im 32/48  brain]
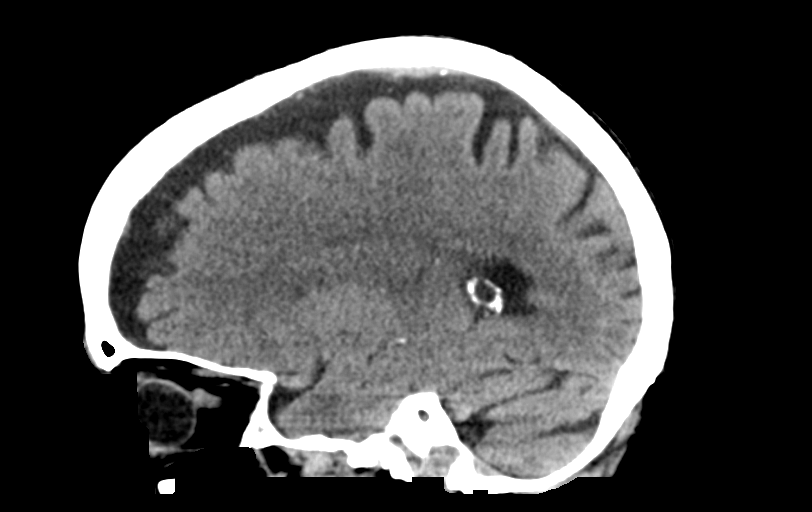

[14 of 47 positions shown; findings below may reference images not displayed]

FINDINGS: Brain: Age-related cerebral atrophy with chronic small vessel
ischemic disease. Chronic right MCA distribution infarct involving
the right frontal operculum. Few scattered remote lacunar infarcts
present about the basal ganglia bilaterally. Small remote right
cerebellar infarct. Focal hypodensity involving the left cerebellum
consistent with an age-indeterminate infarct, possibly acute to
subacute in nature (series 2, image 5). No other acute large vessel
territory infarct. No intracranial hemorrhage. No mass lesion or
midline shift. No hydrocephalus or extra-axial fluid collection.

Vascular: No hyperdense vessel. Calcified atherosclerosis present at
skull base.

Skull: Scalp soft tissues and calvarium within normal limits.

Sinuses/Orbits: Globes or soft tissues demonstrate no acute finding.
Paranasal sinuses mastoid air cells are largely clear.

Other: None.

ASPECTS (Alberta Stroke Program Early CT Score)

- Ganglionic level infarction (caudate, lentiform nuclei, internal
capsule, insula, M1-M3 cortex): 7

- Supraganglionic infarction (M4-M6 cortex): 3

Total score (0-10 with 10 being normal): 10.
IMPRESSION: 1. Focal hypodensity involving the left cerebellum, consistent with
an age-indeterminate infarct, possibly acute to subacute in nature.
No acute intracranial hemorrhage.
2. ASPECTS is 10.
3. Chronic right MCA distribution infarct, stable.
4. Underlying atrophy with chronic small vessel ischemic disease and
multiple remote lacunar infarcts about the basal ganglia.

Critical Value/emergent results were called by telephone at the time
of interpretation on 12/05/2021 at [DATE] to provider [HOSPITAL]
[facility] , who verbally acknowledged these results.

## 2022-06-29 IMAGING — MR MR HEAD W/O CM
11 of 12 series · 41 of 48 positions shown · non-contrast
Comparison: CT head December 05, 2021.  MRI January 03, 2020.

CLINICAL DATA: Neuro deficit, acute, stroke suspected

EXAM:
MRI HEAD WITHOUT CONTRAST
TECHNIQUE: Multiplanar, multiecho pulse sequences of the brain and surrounding
structures were obtained without intravenous contrast.

[Series 5: DWI · axial · 4.0mm · 0.88mm/px · z∈[-71,+68]mm · 5 of 36 slices shown (1 of 6)]
[im 1/36]
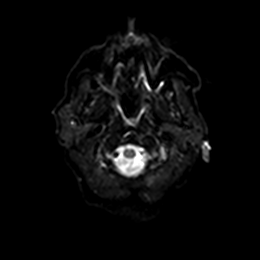
[im 9/36]
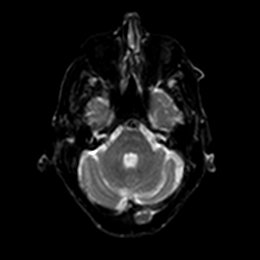
[im 18/36]
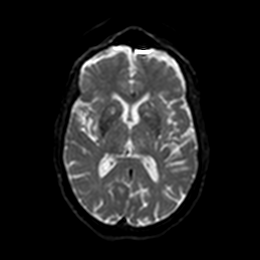
[im 27/36]
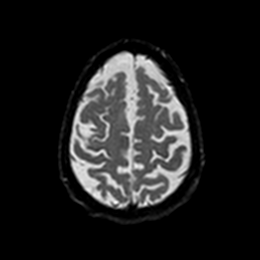
[im 36/36]
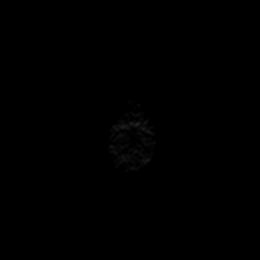

[Series 5: DWI · axial · 4.0mm · 0.88mm/px · z∈[-71,+68]mm · 4 of 36 slices shown (2 of 6)]
[im 1/36]
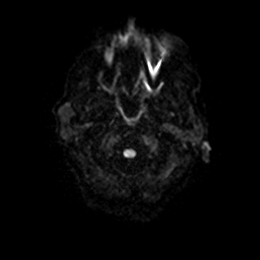
[im 12/36]
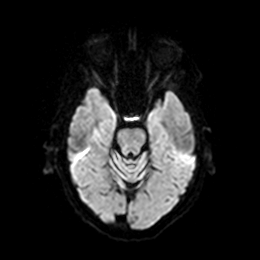
[im 24/36]
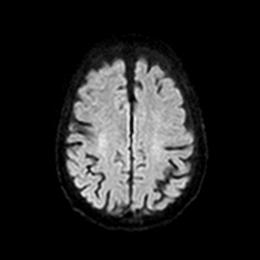
[im 36/36]
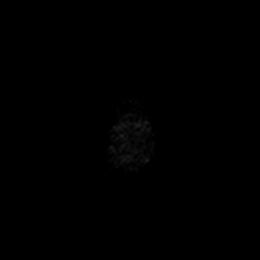

[Series 6: DWI · axial · 4.0mm · 0.88mm/px · z∈[-71,+68]mm · 4 of 35 slices shown (3 of 6)]
[im 1/35]
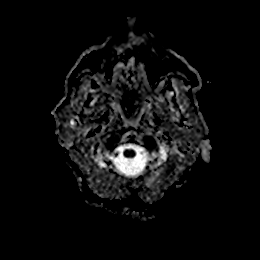
[im 12/35]
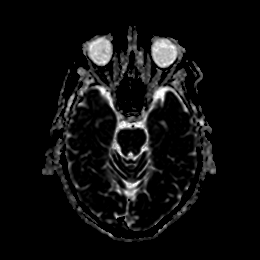
[im 23/35]
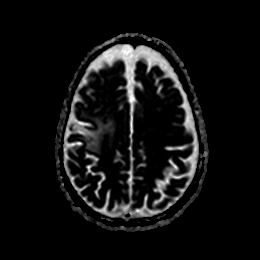
[im 35/35]
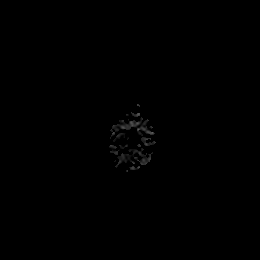

[Series 7: DWI · coronal · 4.0mm · 0.88mm/px · 4 of 32 slices shown (4 of 6)]
[im 1/32]
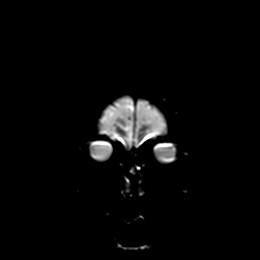
[im 11/32]
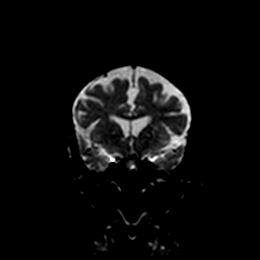
[im 21/32]
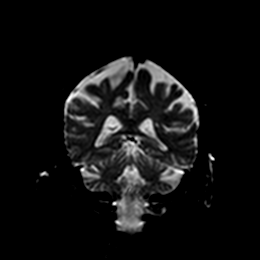
[im 32/32]
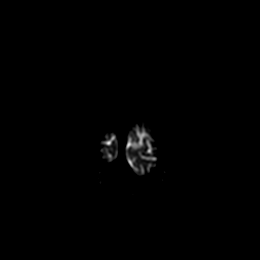

[Series 7: DWI · coronal · 4.0mm · 0.88mm/px · 4 of 32 slices shown (5 of 6)]
[im 1/32]
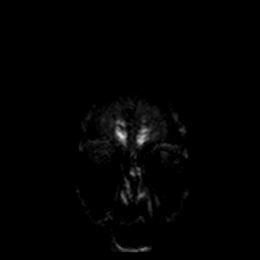
[im 11/32]
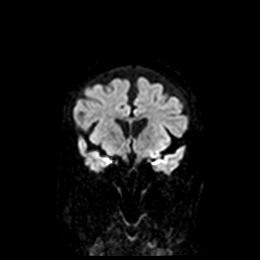
[im 21/32]
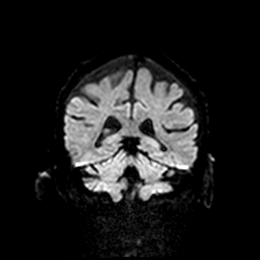
[im 32/32]
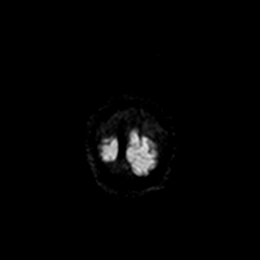

[Series 8: DWI · coronal · 4.0mm · 0.88mm/px · 4 of 32 slices shown (6 of 6)]
[im 1/32]
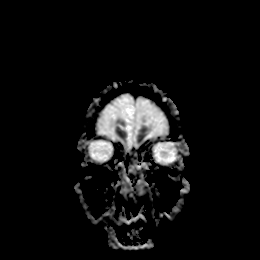
[im 11/32]
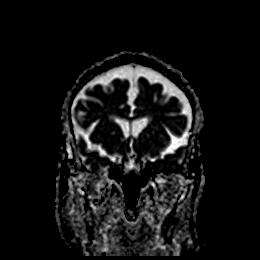
[im 21/32]
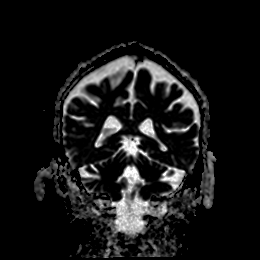
[im 32/32]
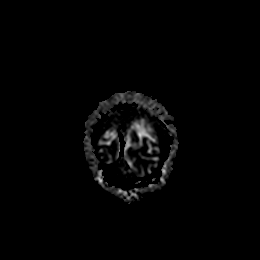

[Series 9: T1 · sagittal · 5.0mm · 0.80mm/px · 2 of 19 slices shown]
[im 1/19]
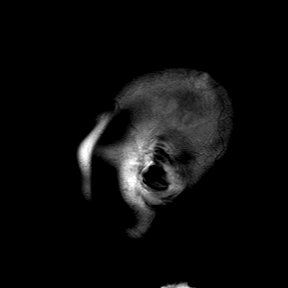
[im 19/19]
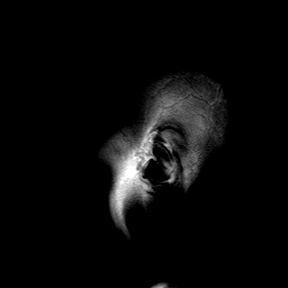

[Series 10: T2 · axial · 5.0mm · 0.72mm/px · z∈[-78,+75]mm · 3 of 23 slices shown (1 of 2)]
[im 1/23]
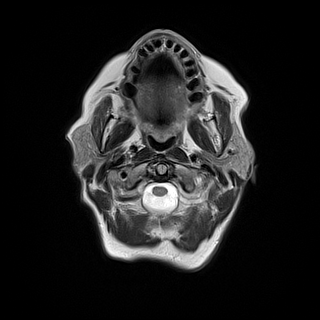
[im 12/23]
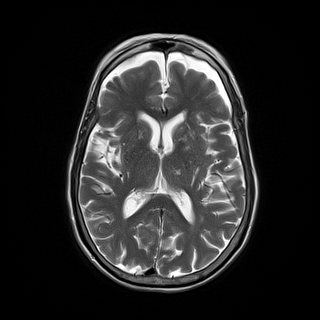
[im 23/23]
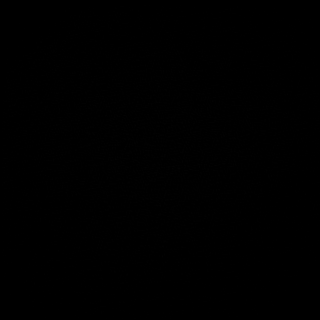

[Series 11: ax hemo · axial · 5.0mm · 0.86mm/px · z∈[-72,+70]mm · 3 of 25 slices shown]
[im 1/25]
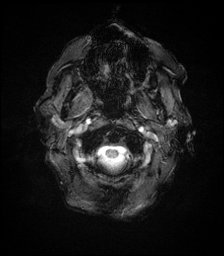
[im 13/25]
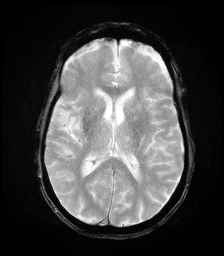
[im 25/25]
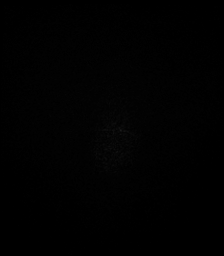

[Series 12: FLAIR · axial · 4.0mm · 0.43mm/px · z∈[-74,+72]mm · 5 of 38 slices shown]
[im 1/38]
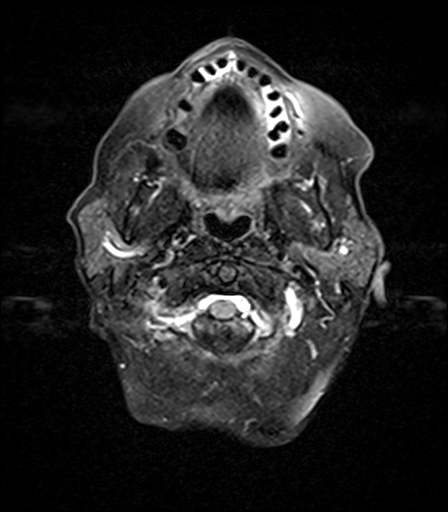
[im 10/38]
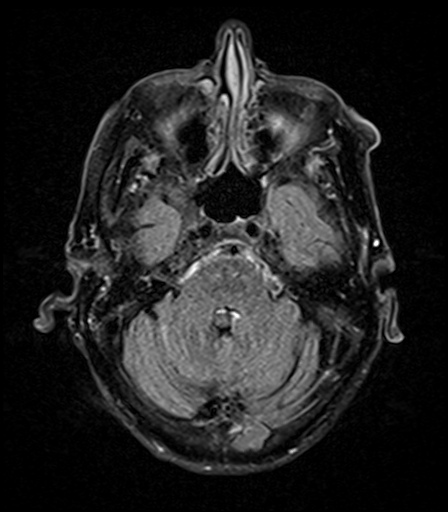
[im 19/38]
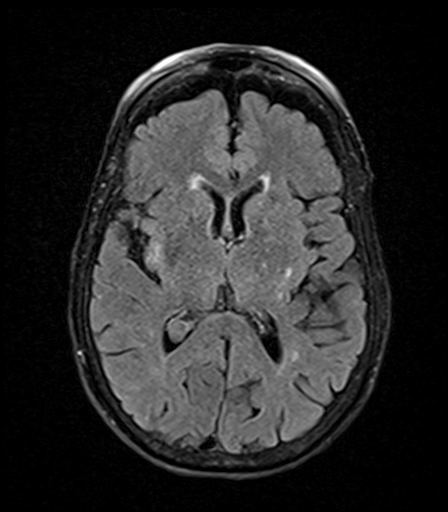
[im 28/38]
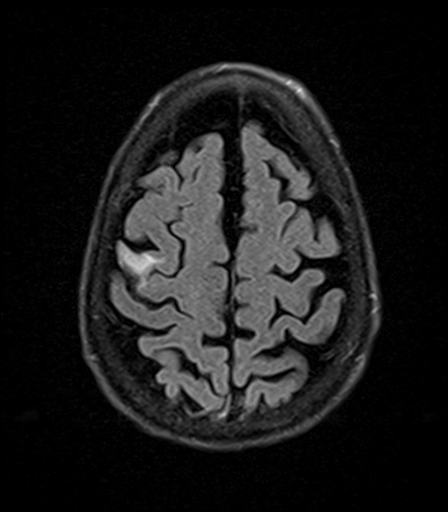
[im 38/38]
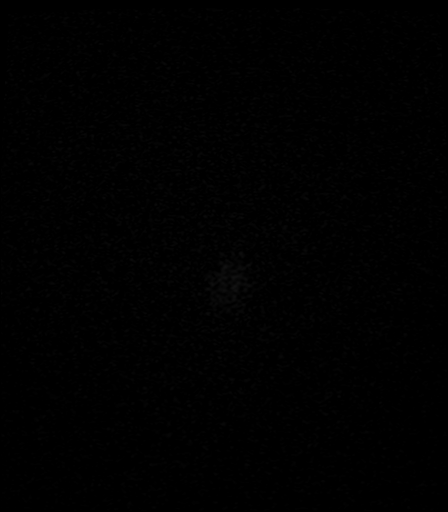

[Series 14: T2 · coronal · 5.0mm · 0.72mm/px · 3 of 28 slices shown (2 of 2)]
[im 1/28]
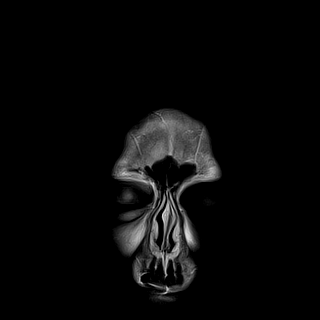
[im 14/28]
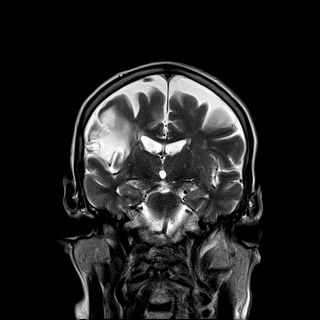
[im 28/28]
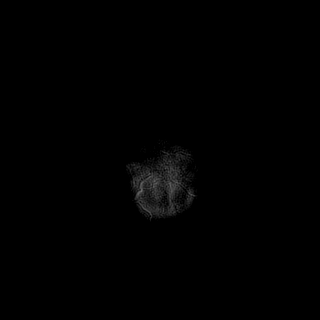

[41 of 48 positions shown; findings below may reference images not displayed]

FINDINGS: Brain: Punctate focus of DWI hyperintensity in the inferior left
cerebellum does not have an ADC correlate in could represent
artifact or subacute infarct. Otherwise, no evidence of acute
infarction, acute hemorrhage, hydrocephalus, extra-axial collection
or mass lesion. Remote left greater than right cerebellar infarcts.
Inferior left cerebellar infarcts are new since 8388 MRI. Remote
right frontal parietal lobe and insula. Small remote infarcts in the
central gray matter. Scattered T2/FLAIR hyperintensity white matter,
nonspecific but compatible with chronic microvascular ischemic
disease.

Vascular: Diminished flow void in the left vertebral artery,
compatible with findings on same day CTA.

Skull and upper cervical spine: Normal marrow signal.

Sinuses/Orbits: Clear sinuses.  No acute orbital findings.

Other: Small to moderate bilateral mastoid effusions.
IMPRESSION: 1. Punctate focus of DWI hyperintensity in the inferior left
cerebellum does not have an ADC correlate and could represent
artifact or subacute infarct.
2. Remote infratentorial and supratentorial infarcts and chronic
microvascular disease. Inferior left cerebellar infarcts are new
since 8388 MRI.
3. Diminished flow void in the left vertebral artery, compatible
with findings on same day CTA.

## 2022-06-29 IMAGING — CT CT ANGIO HEAD-NECK (W OR W/O PERF)
2 of 8 series · 8 of 33 positions shown · IV contrast (Omnipaque or Isovue)
Comparison: CT from 12/05/2021 as well as prior CTA from
01/03/2020.

CLINICAL DATA: Initial evaluation for neuro deficit, stroke
suspected.

EXAM:
CT ANGIOGRAPHY HEAD AND NECK
TECHNIQUE: Multidetector CT imaging of the head and neck was performed using
the standard protocol during bolus administration of intravenous
contrast. Multiplanar CT image reconstructions and MIPs were
obtained to evaluate the vascular anatomy. Carotid stenosis
measurements (when applicable) are obtained utilizing NASCET
criteria, using the distal internal carotid diameter as the
denominator.

[Series 5: cta head & neck · axial · 0.39mm/px · z∈[-175,+63]mm · 6 of 657 slices shown]
[im 94/657  soft-tissue]
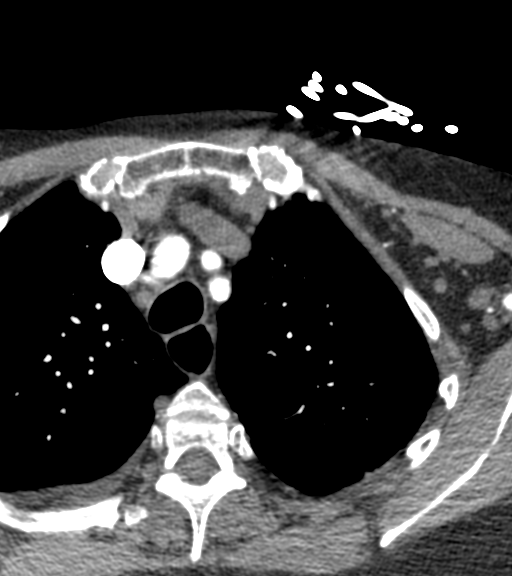
[im 188/657  bone]
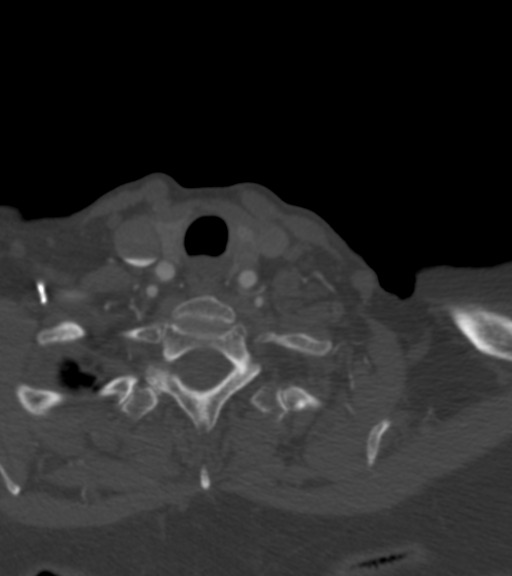
[im 282/657  soft-tissue]
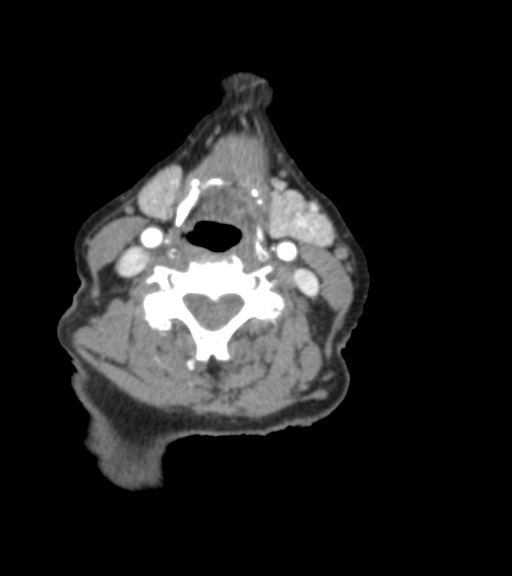
[im 375/657  bone]
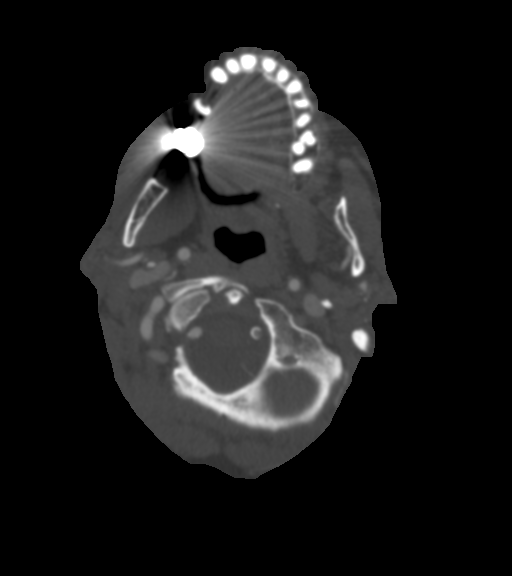
[im 469/657  soft-tissue]
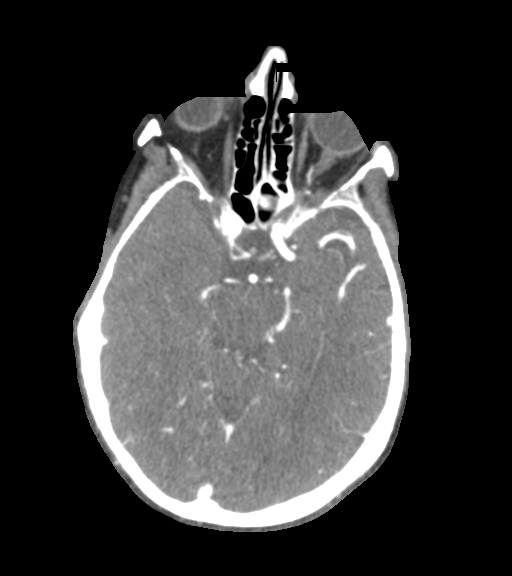
[im 563/657  bone]
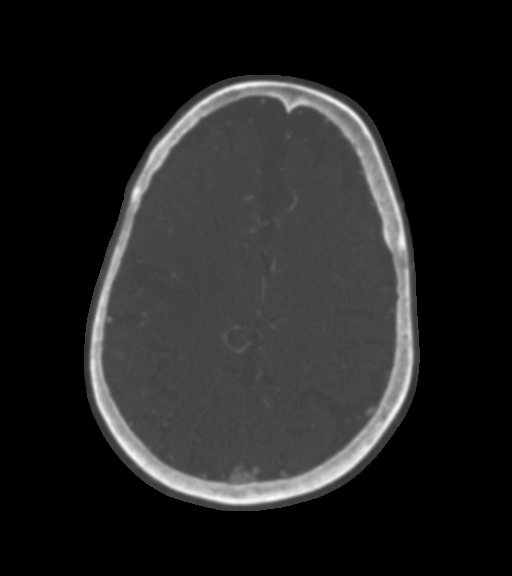

[Series 6: ax thins · axial · 0.39mm/px · z∈[-113,-0]mm · 2 of 329 slices shown]
[im 110/329  soft-tissue]
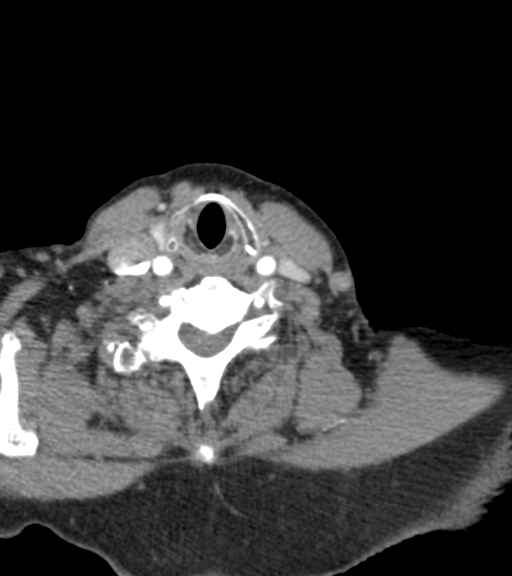
[im 219/329  soft-tissue]
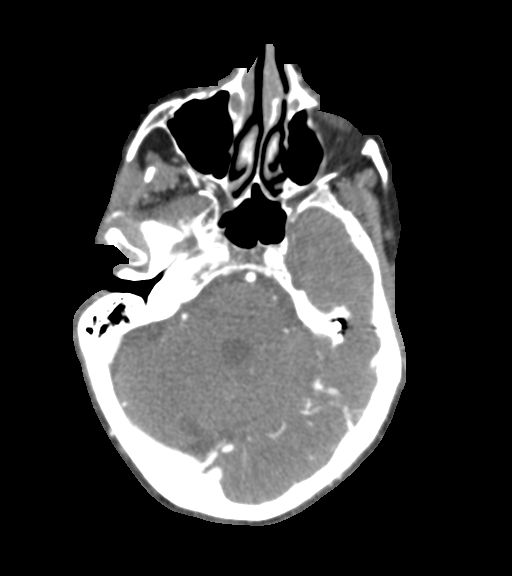

[8 of 33 positions shown; findings below may reference images not displayed]

RADIATION DOSE REDUCTION: This exam was performed according to the
departmental dose-optimization program which includes automated
exposure control, adjustment of the mA and/or kV according to
patient size and/or use of iterative reconstruction technique.

CONTRAST:  100mL OMNIPAQUE IOHEXOL 350 MG/ML SOLN
FINDINGS: CTA NECK FINDINGS

Aortic arch: Aortic arch normal caliber with normal 3 vessel
morphology. Moderate atheromatous change about the arch and origin
the great vessels without significant stenosis.

Right carotid system: Scattered atheromatous change about the right
common and internal carotid arteries without hemodynamically
significant stenosis. No dissection or other acute finding.

Left carotid system: Atheromatous plaque about the left carotid
bulb/proximal left ICA with associated stenosis of up to 70% by
NASCET criteria. Measurement is made on sagittal view, and is
grossly similar to previous. Left ICA patent distally without
stenosis or dissection.

Vertebral arteries: Both vertebral arteries arise from subclavian
arteries. No significant proximal subclavian artery stenosis.
Vertebral arteries patent within the neck without stenosis or
dissection.

Skeleton: Advanced cervical spondylosis with grade 1 retrolisthesis
of C3 on C4 and C5 on C6, with grade 1 anterolisthesis of C4 on C5
and C7 on T1. No discrete or worrisome osseous lesions.

Other neck: No other acute soft tissue abnormality within the neck.
Few small thyroid nodules measure up to 1 cm, of doubtful
significance given size and patient age, no follow-up imaging
recommended (ref: [HOSPITAL]. [DATE]): 143-50).

Upper chest: Right larger than left layering pleural effusions
partially visualized. Visualized upper chest demonstrates no other
acute finding.

Review of the MIP images confirms the above findings

CTA HEAD FINDINGS

Anterior circulation: Petrous segments patent bilaterally.
Atheromatous plaque within the carotid siphons with associated mild
diffuse narrowing. A1 segments patent bilaterally. Normal anterior
communicating artery complex. Anterior cerebral arteries patent
their distal aspects without stenosis. No M1 stenosis or occlusion.
No proximal MCA branch occlusion. Distal MCA branches perfused and
symmetric.

Posterior circulation: Right V4 segment remains widely patent to the
vertebrobasilar junction. Left V4 segment becomes increasingly
attenuated with scant flow as it courses into the cranial vault, and
nearly occludes by the vertebrobasilar junction (series 5, image
269). This is new as compared to prior CTA from 01/03/2020. Left
PICA not well seen. Right PICA patent. Basilar patent to its distal
aspect without stenosis. Superior cerebral arteries patent
bilaterally. Both PCAs primarily supplied via the basilar. Focal
moderate right P2 stenosis, stable. PCAs otherwise patent to their
distal aspects.

Venous sinuses: Grossly patent allowing for timing the contrast
bolus.

Anatomic variants: None significant.  No aneurysm.

Review of the MIP images confirms the above findings
IMPRESSION: 1. Increasing attenuation with near occlusion of the intracranial
left V4 segment, new as compared to prior CTA from 01/03/2020. This
could be acute in nature given the probable evolving left cerebellar
ischemic changes on prior noncontrast head CT.
2. No other large vessel occlusion or other emergent finding.
3. Short-segment 70% stenosis at the left carotid bulb/proximal
cervical left ICA, similar to previous.
4. Focal moderate right P2 stenosis, also similar.
5. Right larger than left layering pleural effusions, partially
visualized.

## 2022-07-01 ENCOUNTER — Encounter (INDEPENDENT_AMBULATORY_CARE_PROVIDER_SITE_OTHER): Payer: Self-pay | Admitting: Gastroenterology

## 2022-07-01 ENCOUNTER — Ambulatory Visit (INDEPENDENT_AMBULATORY_CARE_PROVIDER_SITE_OTHER): Payer: Medicare Other | Admitting: Gastroenterology

## 2022-07-01 ENCOUNTER — Other Ambulatory Visit (INDEPENDENT_AMBULATORY_CARE_PROVIDER_SITE_OTHER): Payer: Self-pay

## 2022-07-01 ENCOUNTER — Encounter (INDEPENDENT_AMBULATORY_CARE_PROVIDER_SITE_OTHER): Payer: Self-pay

## 2022-07-01 VITALS — BP 103/52 | HR 83 | Temp 97.9°F | Ht 63.0 in | Wt 130.0 lb

## 2022-07-01 DIAGNOSIS — R630 Anorexia: Secondary | ICD-10-CM

## 2022-07-01 DIAGNOSIS — I8510 Secondary esophageal varices without bleeding: Secondary | ICD-10-CM

## 2022-07-01 DIAGNOSIS — K2100 Gastro-esophageal reflux disease with esophagitis, without bleeding: Secondary | ICD-10-CM | POA: Diagnosis not present

## 2022-07-01 DIAGNOSIS — I8500 Esophageal varices without bleeding: Secondary | ICD-10-CM

## 2022-07-01 DIAGNOSIS — K7460 Unspecified cirrhosis of liver: Secondary | ICD-10-CM

## 2022-07-01 DIAGNOSIS — R932 Abnormal findings on diagnostic imaging of liver and biliary tract: Secondary | ICD-10-CM

## 2022-07-01 DIAGNOSIS — R1310 Dysphagia, unspecified: Secondary | ICD-10-CM

## 2022-07-01 NOTE — Progress Notes (Signed)
Referring Provider: Redmond School, MD Primary Care Physician:  Redmond School, MD Primary GI Physician: Jenetta Downer   Chief Complaint  Patient presents with   Gastroesophageal Reflux    Patient arrives with grand daughter [name]. Takes omeprazole for reflux and reports it helps. Goes back and forth between diarrhea and constipation. Occasionaly gets choked on food and pills are crushed. Concerns about appetite and reports cardiologist was concerned about weight loss.    HPI:   [name] is a 81 y.o. female with past medical history of NASH cirrhosis, complicated by esophageal varices, s/p banding, CVA, A fib  Patient presenting today for follow of up cirrhosis.  Last seen April 2023, at that time having nausea x3 days and some lower abdominal pain with some diarrhea. Having dysphagia x1 year with solid foods, pills and liquids, maintained on omeprazole 72m daily. Had to have heimlich maneuver previously due to choking. Recent admission at the end of March for CVA.  Recommended to have EGD for esophageal varice screening, continue coreg 6.275mBID, continue PPI daily, zofran 42m61m8h, update AFP.  Last labs: CMP in august with AST 75, ALT 28, ALK Phos 74, T bili 1.7, INR 2.2, plt count 86k AFP was not updated in April, last done in 2021 at 35.6   Last [place] may 2023 with Sludge in an otherwise normal appearing gallbladder, Cirrhosis with heterogeneous echotexture and a nodular contour, No focal mass, Right pleural effusion  Last MELD Na: 17  CT chest abd pelvis w contrast done in ED in august with 4.5 cm hyperenhancing region within the dorsal margin left lobe liver, Portal venous hypertension, manifested by extensive varices and splenomegaly.   Mild sigmoid colon wall thickening, which may reflect underlying diverticulitis or inflammatory/infectious colitis.Gallbladder sludge, with no evidence of calcified gallstones or cholecystitis.  Present:  Patient arrives with her  granddaughter [name] helps care for her and helps provide some history during the visit. Granddaughter states that she is having issues with swallowing foods and pills, patient reports it is not much of an issue but her grand daughter reports her meds have to be crushed daily due to it. Patient reports appetite is good, she notes 39 pounds weight loss over the past year. Granddaughter states that she only eats one good meal per day and maybe one snack, she c/o nausea but reportedly is refusing her nausea medication. [place] states that she only takes a few bites of her food and then will stop. Patient reports nausea is intermittent. She has some diarrhea, occurs 2-3x/every few weeks. Denies rectal bleeding or melena. Takes imodium with good results. Has a BM at baseline every 2-3 days. Denies overt confusion. Granddaughter states that patient sometimes makes financial changes online at night and then denies doing this in the morning.  Denies abdominal swelling but does note some swelling to her legs. No jaundice or pruritus. Denies issues with heartburn or acid reflux.   [place] reports she has had home health speech therapy come out to do swallow studies due to ongoing dysphagia. Last was in January 2022, recommended that pills be crushed but no thickened liquids recommended at that time.   Last Colonoscopy:>10 years ago, does not wish to repeat colonoscopy  Last Endoscopy:07/27/20 - LA Grade A reflux esophagitis with no bleeding.- Grade I esophageal varices with no bleeding and no stigmata of recent bleeding. -2cm hiatal hernia - Portal hypertensive gastropathy. - Normal examined duodenum. - No specimens collected.  Past Medical History:  Diagnosis Date   Anemia    Aortic insufficiency    Moderate   Atrial fibrillation (HCC)    Back pain    Carotid stenosis, bilateral    Cirrhosis ([place])    Closed left femoral fracture (HCC) 04/21/2017   Coronary artery disease    Stent x 2  RCA 1995, cardiac catheterization 09/2016 showing only mild atherosclerosis   DDD (degenerative disc disease), lumbar    Diabetes mellitus without complication (HCC)    Essential hypertension    Falls    GERD (gastroesophageal reflux disease)    Hiatal hernia    History of stroke    Hypercholesteremia    IBS (irritable bowel syndrome)    Non-alcoholic fatty liver disease    OSA (obstructive sleep apnea)    no CPAP   Pericardial effusion    a. small by echo 2018.   PSVT (paroxysmal supraventricular tachycardia)    Recurrent UTI    Seizures ([place]) 2018   Stroke ([place]) 07/2019   several strokes within the last 10 years   Swallowing difficulty    Varices, esophageal ([place])     Past Surgical History:  Procedure Laterality Date   [place]  [phone]   Stent to the proximal RCA after MI    CARDIAC CATHETERIZATION N/A 09/26/2016   Procedure: Left Heart Cath and Coronary Angiography;  Surgeon: Leonie Man, MD;  Location: [facility];  Service: Cardiovascular;  Laterality: N/A;   CATARACT EXTRACTION W/PHACO Right 07/04/2014   Procedure: CATARACT EXTRACTION PHACO AND INTRAOCULAR LENS PLACEMENT (IOC);  Surgeon: Elta Guadeloupe T. Gershon Crane, MD;  Location: [facility];  Service: Ophthalmology;  Laterality: Right;  CDE:13.13   COLONOSCOPY     DILATION AND CURETTAGE OF UTERUS     x2   Epi Retinal Membrane Peel Left    ERCP     ESOPHAGEAL BANDING N/A 04/01/2013   Procedure: ESOPHAGEAL BANDING;  Surgeon: Rogene Houston, MD;  [facility];  Service: Endoscopy;  Laterality: N/A;   ESOPHAGEAL BANDING N/A 05/24/2013   Procedure: ESOPHAGEAL BANDING;  Surgeon: Rogene Houston, MD;  [facility];  Service: Endoscopy;  Laterality: N/A;   ESOPHAGEAL BANDING N/A 06/21/2014   Procedure: ESOPHAGEAL BANDING;  Surgeon: Rogene Houston, MD;  [facility];  Service: Endoscopy;  Laterality: N/A;    ESOPHAGEAL BANDING  06/15/2020   Procedure: ESOPHAGEAL BANDING;  Surgeon: Harvel Quale, MD;  [facility];  Service: Gastroenterology;;   ESOPHAGOGASTRODUODENOSCOPY N/A 04/01/2013   Procedure: ESOPHAGOGASTRODUODENOSCOPY (EGD);  Surgeon: Rogene Houston, MD;  [facility];  Service: Endoscopy;  Laterality: N/A;  230-rescheduled to 8:30am Ann notified pt   ESOPHAGOGASTRODUODENOSCOPY N/A 05/24/2013   Procedure: ESOPHAGOGASTRODUODENOSCOPY (EGD);  Surgeon: Rogene Houston, MD;  [facility];  Service: Endoscopy;  Laterality: N/A;  730   ESOPHAGOGASTRODUODENOSCOPY N/A 06/21/2014   Procedure: ESOPHAGOGASTRODUODENOSCOPY (EGD);  Surgeon: Rogene Houston, MD;  [facility];  Service: Endoscopy;  Laterality: N/A;  930-rescheduled 10/14 @ 1200 Ann to notify pt   ESOPHAGOGASTRODUODENOSCOPY (EGD) WITH PROPOFOL N/A 06/15/2020   Procedure: ESOPHAGOGASTRODUODENOSCOPY (EGD) WITH PROPOFOL;  Surgeon: Harvel Quale, MD;  [facility];  Service: Gastroenterology;  Laterality: N/A;  230   ESOPHAGOGASTRODUODENOSCOPY (EGD) WITH PROPOFOL N/A 07/27/2020   Procedure: ESOPHAGOGASTRODUODENOSCOPY (EGD) WITH PROPOFOL;  Surgeon: Harvel Quale, MD;  [facility];  Service: Gastroenterology;  Laterality: N/A;  1245   EYE SURGERY  08   cataract surgery of the left eye   FEMUR IM NAIL Left 03/02/2017   Procedure: INTRAMEDULLARY (IM) NAIL FEMORAL;  Surgeon: Rod Can, MD;  Location: [facility];  Service: Orthopedics;  Laterality: Left;   HARDWARE REMOVAL Right 01/17/2013   Procedure: REMOVAL OF HARDWARE AND EXCISION ULNAR STYLOID RIGHT WRIST;  Surgeon: Tennis Must, MD;  Location: [facility];  Service: Orthopedics;  Laterality: Right;   implanted heart monitor  02/2020   to monitor Afib   MYRINGOTOMY  2012   both ears   ORIF FEMUR FRACTURE Left 04/23/2017   Procedure: OPEN REDUCTION INTERNAL FIXATION (ORIF) DISTAL FEMUR  FRACTURE (FRACTURE AROUND FEMORAL NAIL);  Surgeon: Altamese Walbridge, MD;  Location: [facility];  Service: Orthopedics;  Laterality: Left;   TONSILLECTOMY     TUBAL LIGATION     VAGINAL HYSTERECTOMY  1972   WRIST SURGERY     rt wrist hardwear removal    Current Outpatient Medications  Medication Sig Dispense Refill   acetaminophen (TYLENOL) 500 MG tablet Take 1,000 mg by mouth at bedtime.     apixaban (ELIQUIS) 5 MG TABS tablet TAKE (1) TABLET BY MOUTH TWICE DAILY. (Patient taking differently: Take 5 mg by mouth 2 (two) times daily.) 60 tablet 5   atorvastatin (LIPITOR) 80 MG tablet Take 1 tablet (80 mg total) by mouth daily. (Patient taking differently: Take 80 mg by mouth at bedtime.) 30 tablet 1   azelastine (ASTELIN) 0.1 % nasal spray Place 1 spray into both nostrils at bedtime.     Calcium 200 MG TABS Take by mouth. One daily     carvedilol (COREG) 6.25 MG tablet Take 1 tablet (6.25 mg total) by mouth 2 (two) times daily with a meal. 180 tablet 3   Cholecalciferol (VITAMIN D3) 50 MCG (2000 UT) TABS Take by mouth. One daily     Coenzyme Q10 (CO Q-10) 100 MG CAPS Take 100 mg by mouth in the morning.     diltiazem (DILACOR XR) 120 MG 24 hr capsule Take 120 mg by mouth daily.     doxylamine, Sleep, (UNISOM) 25 MG tablet Take 1 tablet (25 mg total) by mouth at bedtime as needed.     furosemide (LASIX) 40 MG tablet Take 0.5 tablets (20 mg total) by mouth daily. TAKE (1) TABLET BY MOUTH ONCE DAILY. MAY TAKE ADDITIONAL HALF IF NEEDED FOR SEVERE SWELLING. Strength: 20 mg 45 tablet 0   HYDROcodone-acetaminophen (NORCO) 10-325 MG tablet Take 1 tablet by mouth at bedtime as needed for severe pain.     loperamide (IMODIUM) 2 MG capsule Take 2 mg by mouth as needed for diarrhea or loose stools.     Multiple Vitamins-Minerals (MULTIVITAMIN GUMMIES ADULT PO) Take 2 tablets by mouth daily.     nitroGLYCERIN (NITROSTAT) 0.4 MG SL tablet Place 0.4 mg under the tongue every 5 (five) minutes as needed for chest  pain.     omeprazole (PRILOSEC) 40 MG capsule Take 1 capsule (40 mg total) by mouth daily. 90 capsule 3   ondansetron (ZOFRAN) 4 MG tablet Take 1 tablet (4 mg total) by mouth every 8 (eight) hours as needed for nausea or vomiting. 30 tablet 1   OVER THE COUNTER MEDICATION CBD oil tincture 2.1 mg as needed up to three times daily for appetite.     Polyethyl Glycol-Propyl Glycol (SYSTANE OP) Place 2 drops into both eyes  3 (three) times daily as needed (dry eyes).     polyethylene glycol powder (GLYCOLAX/MIRALAX) 17 GM/SCOOP powder Take 8.5 g by mouth daily. 255 g 0   potassium chloride SA (KLOR-CON M) 20 MEQ tablet Take 20 mEq by mouth 2 (two) times daily.     doxepin (SINEQUAN) 25 MG capsule Take 25 mg by mouth at bedtime.     No current facility-administered medications for this visit.    Allergies as of 07/01/2022 - Review Complete 07/01/2022  Allergen Reaction Noted   Tape Rash and Other (See Comments) 07/03/2011    Family History  Problem Relation Age of Onset   Diabetes Mother    Heart failure Father    Heart failure Maternal Aunt     Social History   Socioeconomic History   Marital status: Widowed    Spouse name: Not on file   Number of children: 2   Years of education: 2 years college   Highest education level: Some college, no degree  Occupational History    Employer: RETIRED  Tobacco Use   Smoking status: Some Days    Packs/day: 0.25    Years: 50.00    Total pack years: 12.50    Types: Cigarettes    Last attempt to quit: 11/03/2017    Years since quitting: 4.6    Passive exposure: Current   Smokeless tobacco: Never   Tobacco comments:    12/23/21 1 pack Q two weeks.  Vaping Use   Vaping Use: Never used  Substance and Sexual Activity   Alcohol use: No    Alcohol/week: 0.0 standard drinks of alcohol   Drug use: No   Sexual activity: Not Currently    Birth control/protection: Surgical  Other Topics Concern   Not on file  Social History Narrative   12/23/21   Lives with 3 grandchildren, sone and dgtr-in -law are across the st   Social Determinants of Health   Financial Resource Strain: Low Risk  (05/09/2022)   Overall Financial Resource Strain (CARDIA)    Difficulty of Paying Living Expenses: Not hard at all  Food Insecurity: No Food Insecurity (09/26/2021)   Hunger Vital Sign    Worried About Running Out of Food in the Last Year: Never true    [place] in the Last Year: Never true  Transportation Needs: No Transportation Needs (05/09/2022)   PRAPARE - Hydrologist (Medical): No    Lack of Transportation (Non-Medical): No  Physical Activity: Inactive (03/16/2019)   Exercise Vital Sign    Days of Exercise per Week: 0 days    Minutes of Exercise per Session: 0 min  Stress: No Stress Concern Present (03/16/2019)   [place]    Feeling of Stress : Not at all  Social Connections: Moderately Isolated (03/16/2019)   Social Connection and Isolation Panel [NHANES]    Frequency of Communication with Friends and Family: More than three times a week    Frequency of Social Gatherings with Friends and Family: Never    Attends Religious Services: 1 to 4 times per year    Active Member of [employer] or Organizations: No    Attends Archivist Meetings: Never    Marital Status: Widowed   Review of systems General: negative for malaise, night sweats, fever, chills, +weight loss  Neck: Negative for lumps, goiter, pain and significant neck swelling Resp: Negative for cough, wheezing, dyspnea at rest CV: Negative  for chest pain, leg swelling, palpitations, orthopnea GI: denies melena, hematochezia,vomiting, constipation, odyonophagia, early satiety. +nausea +diarrhea +dysphagia +decreased appetite +weight loss MSK: Negative for joint pain or swelling, back pain, and muscle pain. Derm: Negative for itching or rash Psych: Denies depression, anxiety, memory loss,  confusion. No homicidal or suicidal ideation.  Heme: Negative for prolonged bleeding, bruising easily, and swollen nodes. Endocrine: Negative for cold or heat intolerance, polyuria, polydipsia and goiter. Neuro: negative for tremor, gait imbalance, syncope and seizures. The remainder of the review of systems is noncontributory.  Physical Exam: BP (!) 103/52 (BP Location: Left Arm, Patient Position: Sitting, Cuff Size: Large)   Pulse 83   Temp 97.9 F (36.6 C) (Oral)   Ht 5' 3"  (1.6 m)   Wt 130 lb (59 kg)   BMI 23.03 kg/m  General:   Alert and oriented. No distress noted. Pleasant and cooperative. Patient in wheelchair Head:  Normocephalic and atraumatic. Eyes:  Conjuctiva clear without scleral icterus. Mouth:  Oral mucosa pink and moist. Good dentition. No lesions. Heart: Normal rate and rhythm, s1 and s2 heart sounds present.  Lungs: Clear lung sounds in all lobes. Respirations equal and unlabored. Abdomen:  +BS, soft, non-tender and non-distended. No rebound or guarding. No HSM or masses noted. Derm: No palmar erythema or jaundice Msk:  Symmetrical without gross deformities. Normal posture. Extremities:  Without edema. Neurologic:  Alert and  oriented x4, no asterixis Psych:  Alert and cooperative. Normal mood and affect.  Invalid input(s): "6 MONTHS"   ASSESSMENT: Aryel Edelen Frances is a 81 y.o. female presenting today for follow up of cirrhosis.  Last MELD 17, she denies swelling to the abdomen, pruritus or jaundice, no asterixis on exam, she is a/ox4 today. notably no AFP done since December 2021, value was 35.6 at that time. Repeat was ordered at last OV but patient failed to have this done. CT C/A/P in august during hospital visit for urosepsis with 4.5cm lesion in dorsal margin of left lobe of liver. Discussed recommendations to proceed with MRI for further evaluation of this, and importance of updating AFP. notably [place] of liver in may without an obvious masses. Patient is  amenable to proceeding with MRI (denies need for anti anxiety meds for exam) and updating AFP.   She did not have EGD done as recommended at last visit for esophageal varice screening. She continues to have issues with dysphagia, has to have her pills crushed and grand daughter who helps care for her reports that she has had to have the heimlich performed a few times due to foods getting stuck. Patient denies any issues with GERD. She does have ongoing nausea and has had some significant weight loss over the past year with reported decrease in PO intake. Recommend EGD for variceal screening as well as nausea, weight loss and dysphagia. Indications, risks and benefits of procedure discussed in detail with patient. Patient verbalized understanding and is in agreement to proceed with EGD at this time.   CT during recent hospitalization also with mild sigmoid wall thickening, query inflammatory vs infectious colitis. She is having some intermittent loose stools but this has been going on for some time, per the patient. No rectal bleeding or melena. Discussed indications for colonoscopy to follow up on this, however, patient declines colonoscopy at this time.   PLAN:  -update AFP -MRI liver w wo contrast for liver lesion -Schedule EGD ASA III, ENDO 3 -continue coreg 6.31m BID -continue PPI daily - Reduce salt intake to <  2 g per day - Can take Tylenol max of 2 g per day (650 mg q8h) for pain - Avoid NSAIDs for pain - Avoid eating raw oysters/shellfish - increase ensure to BID-TID  All questions were answered, patient and caregiver/granddaughter [name] verbalized understanding and is in agreement with plan as outlined above.    Follow Up: 6 months   Ramar Nobrega L. Alver Sorrow, MSN, APRN, AGNP-C Adult-Gerontology Nurse Practitioner [facility]

## 2022-07-01 NOTE — Patient Instructions (Addendum)
-   Reduce salt intake to <2 g per day - Can take Tylenol max of 2 g per day (650 mg q8h) for pain - Avoid NSAIDs for pain - Avoid eating raw oysters/shellfish -can do ensure 2-3 times per day to help with nutrition - it is important that we repeat your upper endoscopy given history of esophageal varices, as discussed routine evaluation of these is important as these can bleed and this is life threatening - we need to repeat AFP tumor marker, this is a lab test that is important to have with history of cirrhosis - we will schedule MRI of the liver given lesion found on CT in august during hospital visit - please continue omeprazole 31m daily and coreg 6.25 mg twice daily  Follow up 6 months

## 2022-07-02 ENCOUNTER — Encounter (INDEPENDENT_AMBULATORY_CARE_PROVIDER_SITE_OTHER): Payer: Self-pay

## 2022-07-02 DIAGNOSIS — D649 Anemia, unspecified: Secondary | ICD-10-CM | POA: Diagnosis not present

## 2022-07-02 DIAGNOSIS — G4733 Obstructive sleep apnea (adult) (pediatric): Secondary | ICD-10-CM | POA: Diagnosis not present

## 2022-07-02 DIAGNOSIS — Z7901 Long term (current) use of anticoagulants: Secondary | ICD-10-CM | POA: Diagnosis not present

## 2022-07-02 DIAGNOSIS — I2510 Atherosclerotic heart disease of native coronary artery without angina pectoris: Secondary | ICD-10-CM | POA: Diagnosis not present

## 2022-07-02 DIAGNOSIS — D696 Thrombocytopenia, unspecified: Secondary | ICD-10-CM | POA: Diagnosis not present

## 2022-07-02 DIAGNOSIS — E119 Type 2 diabetes mellitus without complications: Secondary | ICD-10-CM | POA: Diagnosis not present

## 2022-07-02 DIAGNOSIS — I480 Paroxysmal atrial fibrillation: Secondary | ICD-10-CM | POA: Diagnosis not present

## 2022-07-02 DIAGNOSIS — K7460 Unspecified cirrhosis of liver: Secondary | ICD-10-CM | POA: Diagnosis not present

## 2022-07-02 DIAGNOSIS — R1310 Dysphagia, unspecified: Secondary | ICD-10-CM | POA: Diagnosis not present

## 2022-07-02 DIAGNOSIS — K589 Irritable bowel syndrome without diarrhea: Secondary | ICD-10-CM | POA: Diagnosis not present

## 2022-07-02 DIAGNOSIS — A419 Sepsis, unspecified organism: Secondary | ICD-10-CM | POA: Diagnosis not present

## 2022-07-02 DIAGNOSIS — N390 Urinary tract infection, site not specified: Secondary | ICD-10-CM | POA: Diagnosis not present

## 2022-07-02 DIAGNOSIS — F17200 Nicotine dependence, unspecified, uncomplicated: Secondary | ICD-10-CM | POA: Diagnosis not present

## 2022-07-02 DIAGNOSIS — K219 Gastro-esophageal reflux disease without esophagitis: Secondary | ICD-10-CM | POA: Diagnosis not present

## 2022-07-02 DIAGNOSIS — I5032 Chronic diastolic (congestive) heart failure: Secondary | ICD-10-CM | POA: Diagnosis not present

## 2022-07-02 DIAGNOSIS — I110 Hypertensive heart disease with heart failure: Secondary | ICD-10-CM | POA: Diagnosis not present

## 2022-07-02 DIAGNOSIS — Z8673 Personal history of transient ischemic attack (TIA), and cerebral infarction without residual deficits: Secondary | ICD-10-CM | POA: Diagnosis not present

## 2022-07-02 DIAGNOSIS — K449 Diaphragmatic hernia without obstruction or gangrene: Secondary | ICD-10-CM | POA: Diagnosis not present

## 2022-07-02 DIAGNOSIS — Z955 Presence of coronary angioplasty implant and graft: Secondary | ICD-10-CM | POA: Diagnosis not present

## 2022-07-02 DIAGNOSIS — E7800 Pure hypercholesterolemia, unspecified: Secondary | ICD-10-CM | POA: Diagnosis not present

## 2022-07-02 DIAGNOSIS — I350 Nonrheumatic aortic (valve) stenosis: Secondary | ICD-10-CM | POA: Diagnosis not present

## 2022-07-02 LAB — AFP TUMOR MARKER: AFP, Serum, Tumor Marker: 203 ng/mL — ABNORMAL HIGH (ref 0.0–9.2)

## 2022-07-02 NOTE — Addendum Note (Signed)
Addended by: Harvel Quale on: 07/02/2022 08:14 AM   Modules accepted: Level of Service

## 2022-07-09 DIAGNOSIS — I350 Nonrheumatic aortic (valve) stenosis: Secondary | ICD-10-CM | POA: Diagnosis not present

## 2022-07-09 DIAGNOSIS — D649 Anemia, unspecified: Secondary | ICD-10-CM | POA: Diagnosis not present

## 2022-07-09 DIAGNOSIS — K449 Diaphragmatic hernia without obstruction or gangrene: Secondary | ICD-10-CM | POA: Diagnosis not present

## 2022-07-09 DIAGNOSIS — I480 Paroxysmal atrial fibrillation: Secondary | ICD-10-CM | POA: Diagnosis not present

## 2022-07-09 DIAGNOSIS — Z8673 Personal history of transient ischemic attack (TIA), and cerebral infarction without residual deficits: Secondary | ICD-10-CM | POA: Diagnosis not present

## 2022-07-09 DIAGNOSIS — I2510 Atherosclerotic heart disease of native coronary artery without angina pectoris: Secondary | ICD-10-CM | POA: Diagnosis not present

## 2022-07-09 DIAGNOSIS — D696 Thrombocytopenia, unspecified: Secondary | ICD-10-CM | POA: Diagnosis not present

## 2022-07-09 DIAGNOSIS — E119 Type 2 diabetes mellitus without complications: Secondary | ICD-10-CM | POA: Diagnosis not present

## 2022-07-09 DIAGNOSIS — I110 Hypertensive heart disease with heart failure: Secondary | ICD-10-CM | POA: Diagnosis not present

## 2022-07-09 DIAGNOSIS — K589 Irritable bowel syndrome without diarrhea: Secondary | ICD-10-CM | POA: Diagnosis not present

## 2022-07-09 DIAGNOSIS — I5032 Chronic diastolic (congestive) heart failure: Secondary | ICD-10-CM | POA: Diagnosis not present

## 2022-07-09 DIAGNOSIS — K219 Gastro-esophageal reflux disease without esophagitis: Secondary | ICD-10-CM | POA: Diagnosis not present

## 2022-07-09 DIAGNOSIS — R1310 Dysphagia, unspecified: Secondary | ICD-10-CM | POA: Diagnosis not present

## 2022-07-09 DIAGNOSIS — G4733 Obstructive sleep apnea (adult) (pediatric): Secondary | ICD-10-CM | POA: Diagnosis not present

## 2022-07-09 DIAGNOSIS — N390 Urinary tract infection, site not specified: Secondary | ICD-10-CM | POA: Diagnosis not present

## 2022-07-09 DIAGNOSIS — Z7901 Long term (current) use of anticoagulants: Secondary | ICD-10-CM | POA: Diagnosis not present

## 2022-07-09 DIAGNOSIS — A419 Sepsis, unspecified organism: Secondary | ICD-10-CM | POA: Diagnosis not present

## 2022-07-09 DIAGNOSIS — F17200 Nicotine dependence, unspecified, uncomplicated: Secondary | ICD-10-CM | POA: Diagnosis not present

## 2022-07-09 DIAGNOSIS — E7800 Pure hypercholesterolemia, unspecified: Secondary | ICD-10-CM | POA: Diagnosis not present

## 2022-07-09 DIAGNOSIS — K7460 Unspecified cirrhosis of liver: Secondary | ICD-10-CM | POA: Diagnosis not present

## 2022-07-09 DIAGNOSIS — Z955 Presence of coronary angioplasty implant and graft: Secondary | ICD-10-CM | POA: Diagnosis not present

## 2022-07-14 ENCOUNTER — Encounter: Payer: Self-pay | Admitting: *Deleted

## 2022-07-15 ENCOUNTER — Ambulatory Visit: Payer: Self-pay | Admitting: *Deleted

## 2022-07-15 NOTE — Patient Outreach (Signed)
  Care Coordination   Follow Up Visit Note   07/15/2022 Name: [name] MRN: [id number] DOB: 1940/10/07  [name] is a 81 y.o. year old female who sees Redmond School, MD for primary care. I spoke with  [name] by phone today.  What matters to the patients health and wellness today?  Ongoing health management    Goals Addressed             This Visit's Progress    Care Coordination Services       Care Coordination Interventions: Evaluation of current treatment plan related to ongoing recovery from stroke and patient's adherence to plan as established by provider Discussed plans with patient for ongoing care management follow up and provided patient with direct contact information for care management team Assessed social determinant of health barriers Reviewed and discussed recent OV with GI and treatment plan Discussed completion of PT. Patient feels that it helped with strength and balance.  Encouraged to reach out to [facility] as needed          SDOH assessments and interventions completed:  Yes  SDOH Interventions Today    Flowsheet Row Most Recent Value  SDOH Interventions   Transportation Interventions Intervention Not Indicated  Financial Strain Interventions Intervention Not Indicated        Care Coordination Interventions Activated:  Yes  Care Coordination Interventions:  Yes, provided   Follow up plan: Follow up call scheduled for 09/15/22    Encounter Outcome:  Pt. Visit Completed   Chong Sicilian, BSN, RN-BC RN Care Coordinator [place]: [phone] Main #: [phone]

## 2022-07-17 IMAGING — MG MM DIGITAL DIAGNOSTIC UNILAT*L* W/ TOMO W/ CAD
6 series · 6 of 18 positions shown · non-contrast
Comparison: Previous exams.

CLINICAL DATA: 80-year-old female with a palpable area of concern
in the lower inner left breast. The patient states this has been
palpable following stroke and subsequent 30 pound weight loss.

EXAM:
DIGITAL DIAGNOSTIC UNILATERAL LEFT MAMMOGRAM WITH TOMOSYNTHESIS AND
CAD
TECHNIQUE: Left digital diagnostic mammography and breast tomosynthesis was
performed. The images were evaluated with computer-aided detection.

[L CC synth-2D (1 of 2)]
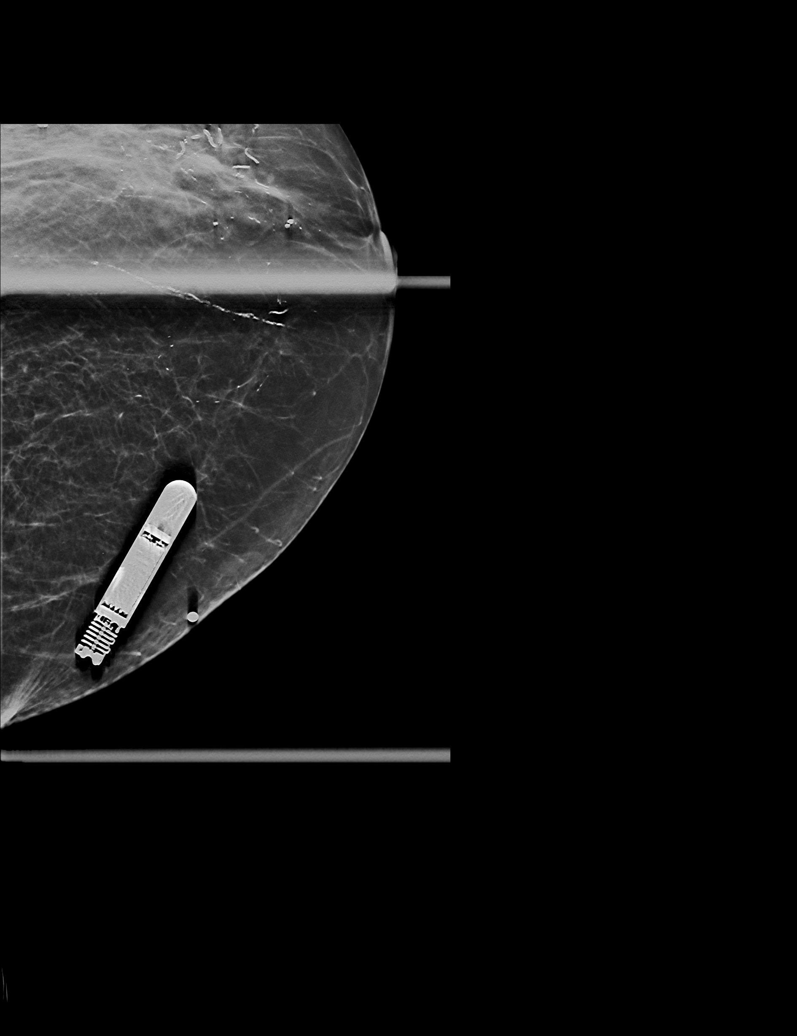

[L MLO synth-2D]
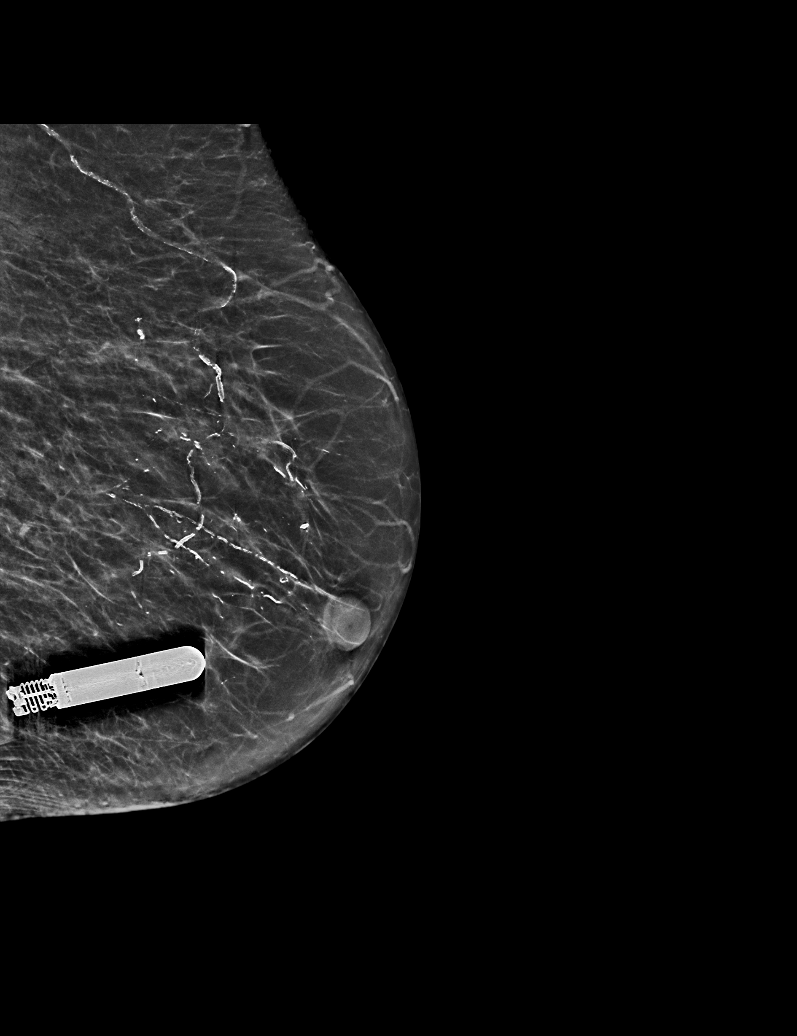

[L CC synth-2D (2 of 2)]
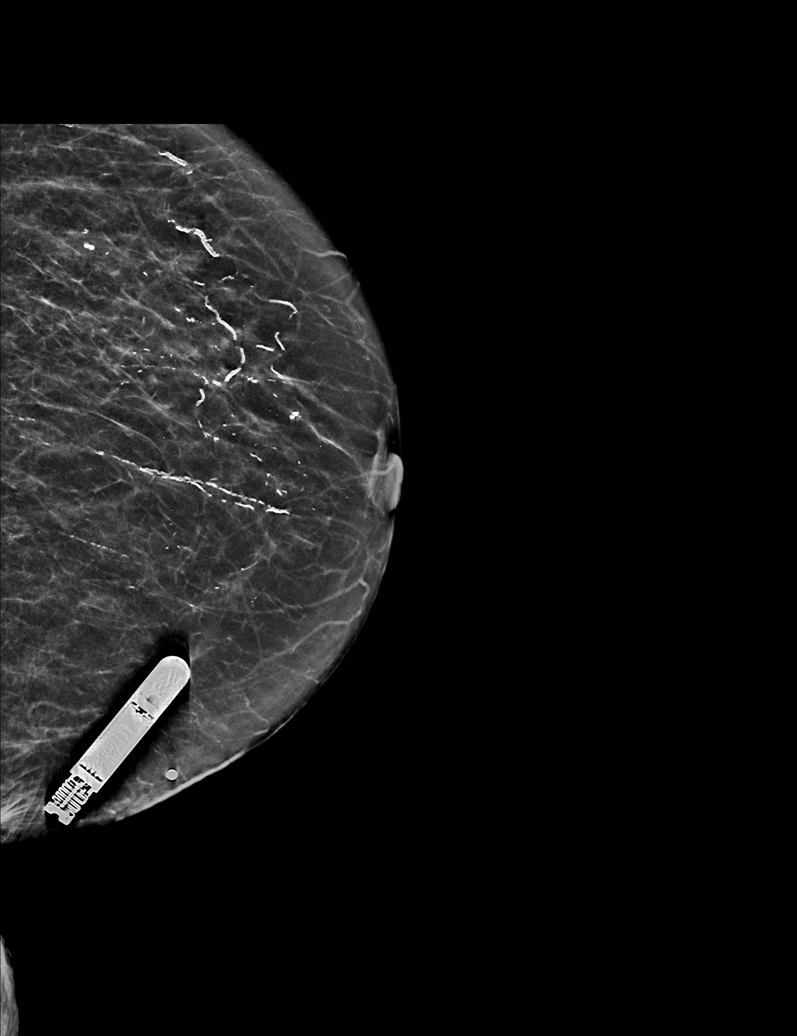

[L CC tomo (1 of 2) · tomo slice 19/37.0]
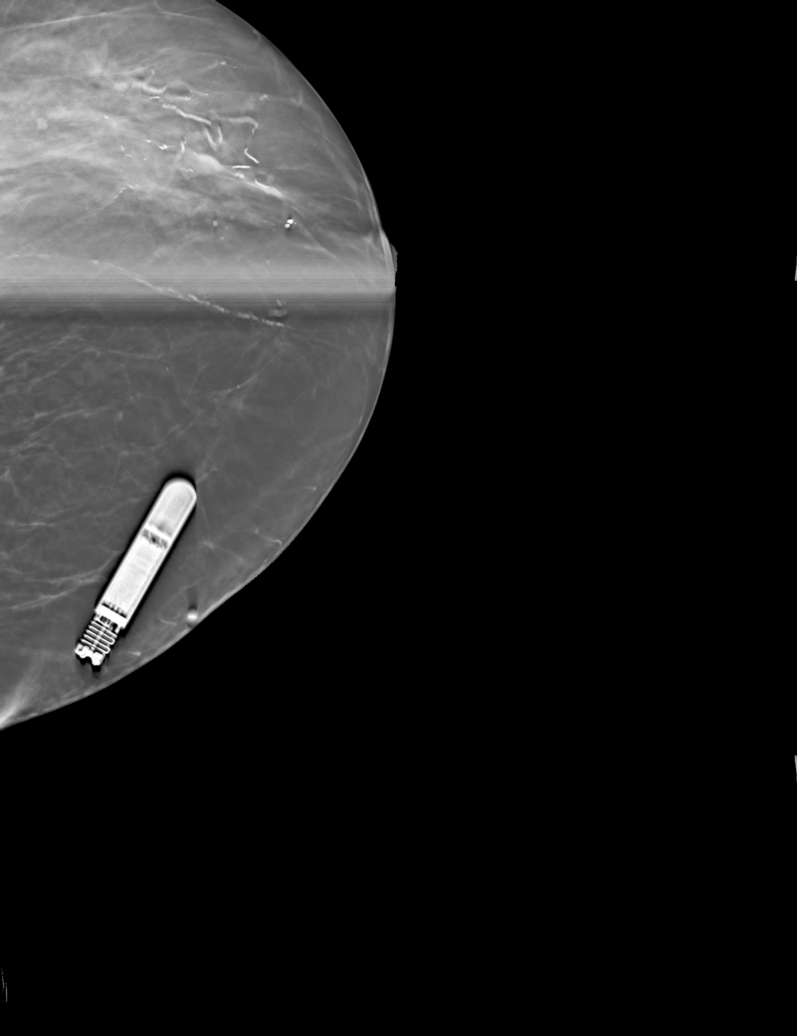

[L MLO tomo · tomo slice 25/48.0]
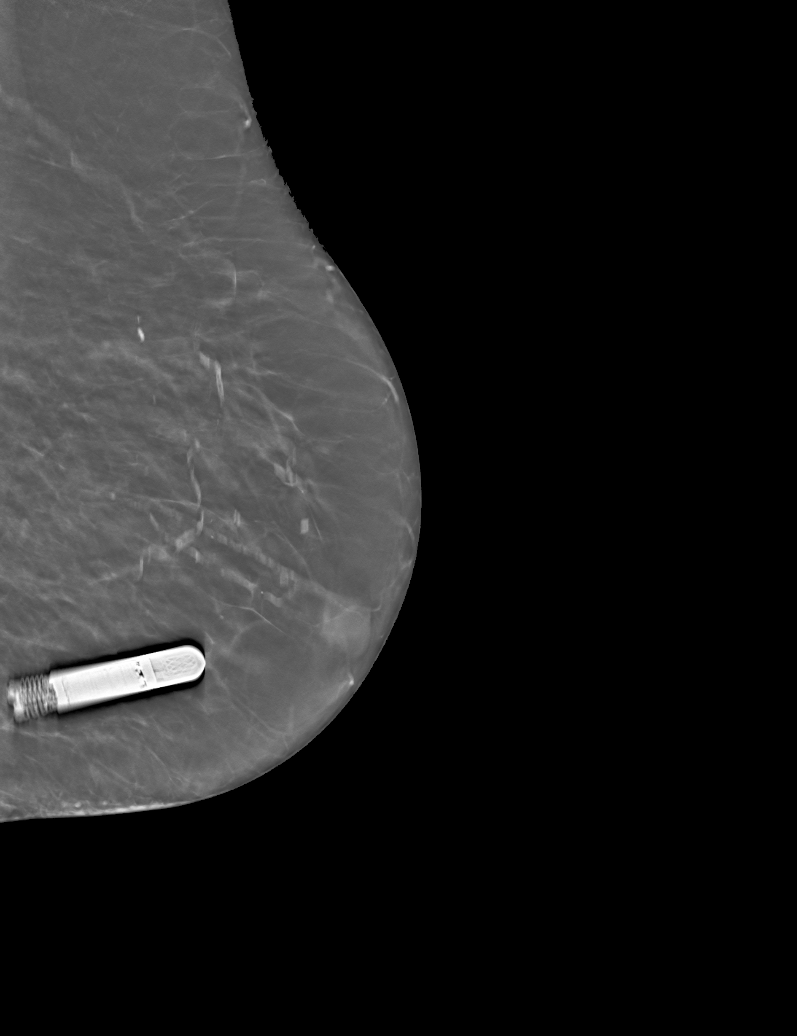

[L CC tomo (2 of 2) · tomo slice 23/46.0]
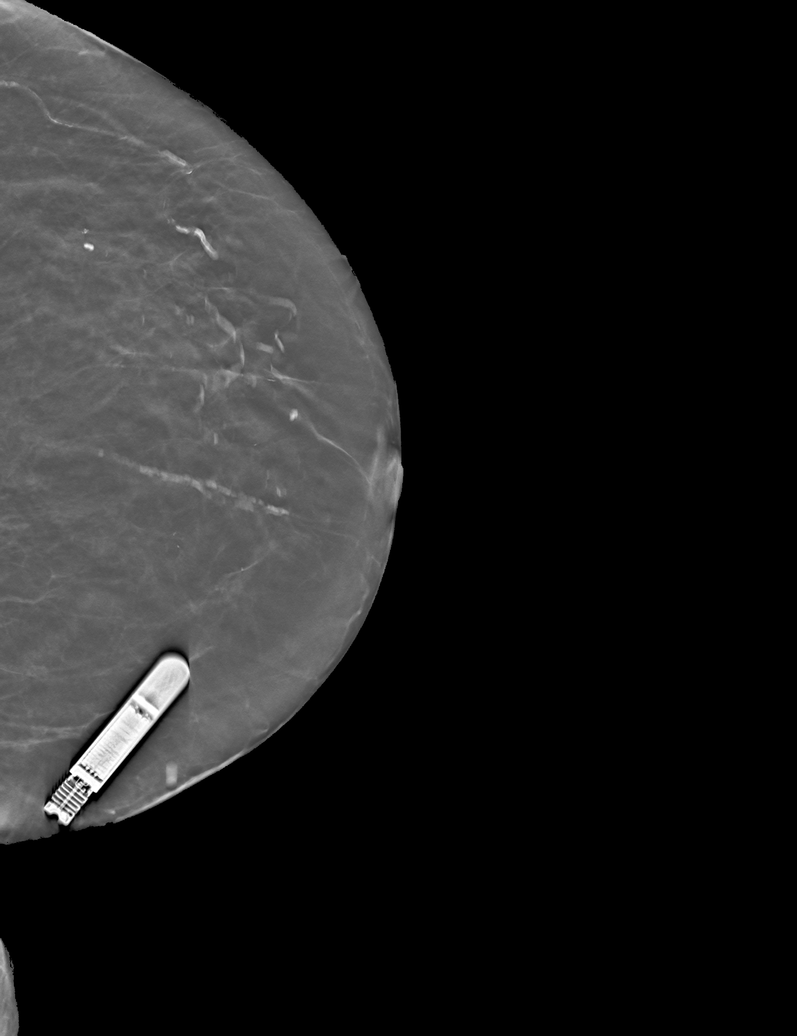

[6 of 18 positions shown; findings below may reference images not displayed]

ACR Breast Density Category b: There are scattered areas of
fibroglandular density.
FINDINGS: No suspicious masses or calcifications are seen in the left breast.
Spot compression tomograms were performed over the palpable area of
concern in the lower left breast with a palpable marker
corresponding directly with the patient's loop recorder. There is no
mammographic evidence of malignancy in the left breast.
IMPRESSION: Left breast palpable area of concern corresponds directly with
patient's loop recorder. There is no mammographic evidence of
malignancy in the left breast.

RECOMMENDATION:
Recommend annual screening mammography, due March 2022.

I have discussed the findings and recommendations with the patient.
If applicable, a reminder letter will be sent to the patient
regarding the next appointment.

BI-RADS CATEGORY  2: Benign.

## 2022-07-18 NOTE — Progress Notes (Signed)
Carelink Summary Report / Loop Recorder 

## 2022-07-24 ENCOUNTER — Ambulatory Visit (HOSPITAL_COMMUNITY)
Admission: RE | Admit: 2022-07-24 | Discharge: 2022-07-24 | Disposition: A | Discharge status: Home / Self Care | Payer: Medicare Other | Source: Ambulatory Visit | Attending: Gastroenterology | Admitting: Gastroenterology

## 2022-07-24 DIAGNOSIS — C221 Intrahepatic bile duct carcinoma: Secondary | ICD-10-CM | POA: Diagnosis not present

## 2022-07-24 DIAGNOSIS — K7460 Unspecified cirrhosis of liver: Secondary | ICD-10-CM | POA: Insufficient documentation

## 2022-07-24 DIAGNOSIS — K766 Portal hypertension: Secondary | ICD-10-CM | POA: Diagnosis not present

## 2022-07-24 DIAGNOSIS — I868 Varicose veins of other specified sites: Secondary | ICD-10-CM | POA: Diagnosis not present

## 2022-07-24 DIAGNOSIS — R932 Abnormal findings on diagnostic imaging of liver and biliary tract: Secondary | ICD-10-CM | POA: Insufficient documentation

## 2022-07-24 MED ORDER — GADOBUTROL 1 MMOL/ML IV SOLN
7.0000 mL | Freq: Once | INTRAVENOUS | Status: AC | PRN
  Administered 2022-07-24: 7 mL via INTRAVENOUS

## 2022-07-28 ENCOUNTER — Ambulatory Visit (INDEPENDENT_AMBULATORY_CARE_PROVIDER_SITE_OTHER): Payer: Medicare Other

## 2022-07-28 ENCOUNTER — Other Ambulatory Visit (INDEPENDENT_AMBULATORY_CARE_PROVIDER_SITE_OTHER): Payer: Self-pay | Admitting: *Deleted

## 2022-07-28 ENCOUNTER — Other Ambulatory Visit (INDEPENDENT_AMBULATORY_CARE_PROVIDER_SITE_OTHER): Payer: Self-pay | Admitting: Gastroenterology

## 2022-07-28 DIAGNOSIS — R772 Abnormality of alphafetoprotein: Secondary | ICD-10-CM

## 2022-07-28 DIAGNOSIS — K769 Liver disease, unspecified: Secondary | ICD-10-CM

## 2022-07-28 DIAGNOSIS — R932 Abnormal findings on diagnostic imaging of liver and biliary tract: Secondary | ICD-10-CM

## 2022-07-28 DIAGNOSIS — I639 Cerebral infarction, unspecified: Secondary | ICD-10-CM | POA: Diagnosis not present

## 2022-07-29 ENCOUNTER — Other Ambulatory Visit (INDEPENDENT_AMBULATORY_CARE_PROVIDER_SITE_OTHER): Payer: Self-pay

## 2022-07-29 DIAGNOSIS — R772 Abnormality of alphafetoprotein: Secondary | ICD-10-CM

## 2022-07-29 DIAGNOSIS — K769 Liver disease, unspecified: Secondary | ICD-10-CM | POA: Diagnosis not present

## 2022-07-29 DIAGNOSIS — R932 Abnormal findings on diagnostic imaging of liver and biliary tract: Secondary | ICD-10-CM | POA: Diagnosis not present

## 2022-07-29 LAB — CUP PACEART REMOTE DEVICE CHECK
Date Time Interrogation Session: 20231119231333
Implantable Pulse Generator Implant Date: 20210602

## 2022-07-30 ENCOUNTER — Encounter: Payer: Self-pay | Admitting: General Practice

## 2022-07-30 ENCOUNTER — Other Ambulatory Visit (INDEPENDENT_AMBULATORY_CARE_PROVIDER_SITE_OTHER): Payer: Self-pay | Admitting: Gastroenterology

## 2022-07-30 DIAGNOSIS — I8510 Secondary esophageal varices without bleeding: Secondary | ICD-10-CM

## 2022-07-30 LAB — CANCER ANTIGEN 19-9: CA 19-9: 16 U/mL (ref 0–35)

## 2022-07-30 NOTE — Progress Notes (Signed)
Alexia Freestone, NT      Previous Messages    ----- Message ----- From: Michaelle Birks, MD Sent: 07/29/2022   4:45 PM EST To: Riley Lam; Ir Procedure Requests Subject: RE: US Liver BX                                BIOPSY REVIEW Date: 07/29/22  Requested Biopsy site: Liver Reason for request: mass  Imaging review: I reviewed all pertinent diagnostic studies, including; MR Abd, 07/24/22, CT [facility], 04/12/22 and [place] RUQ, 01/20/22 Recommended imaging modality to perform biopsy: Ultrasound  Additional comments: @Performing  Doc, Left hepatic lobe mass, can be seen retrospectively on 01/20/22 [place] RUQ  @Schedulers , Pt on Eliquis and needs a 3day hold. New INR draw, needs < 1.8 for procedure. New CBC, needs platelets > 50K  Please contact me with questions, concerns, or if issue pertaining to this request arise.  Michaelle Birks, MD [facility]  Pager. [phone] Clinic. [phone]  ----- Message ----- From: Donita Brooks D Sent: 07/29/2022   6:33 AM EST To: Ir Procedure Requests Subject: US Liver BX                                    Procedure: US Liver BX  Reason Liver Lesion  History: MRI/ CT  Provider: Gabriel Rung, NP  Provider Contact # [phone]

## 2022-08-04 ENCOUNTER — Encounter: Payer: Self-pay | Admitting: General Practice

## 2022-08-04 NOTE — Progress Notes (Signed)
Arnoldo Lenis, MD  Allen Kell, NT Ok to hold eliquis as needed for procedure  Zandra Abts MD       Previous Messages    ----- Message ----- From: Allen Kell, NT Sent: 07/30/2022   8:29 AM EST To: Arnoldo Lenis, MD; Gabriel Rung, NP Subject: Blood Thinner                                  Good Morning The above pt is scheduled for a Biopsy and will need to hold her Eliquis for 3 days prior to procedure. Can we get permission to hold this medication please.  Thanks [facility] Scheduling

## 2022-08-12 NOTE — Patient Instructions (Signed)
[name]  08/12/2022     @PREFPERIOPPHARMACY @   Your procedure is scheduled on  08/19/2022.   Report to [facility] at  1130  A.M.   Call this number if you have problems the morning of surgery:  [phone]  If you experience any cold or flu symptoms such as cough, fever, chills, shortness of breath, etc. between now and your scheduled surgery, please notify us at the above number.   Remember:  Follow the diet instructions given to you by the office.       Your last day of eliquis should be on 08/16/2022.     Take these medicines the morning of surgery with A SIP OF WATER         carvedilol, diltiazem, omeprazol, zofran (if needed).     Do not wear jewelry, make-up or nail polish.  Do not wear lotions, powders, or perfumes, or deodorant.  Do not shave 48 hours prior to surgery.  Men may shave face and neck.  Do not bring valuables to the hospital.  [facility] is not responsible for any belongings or valuables.  Contacts, dentures or bridgework may not be worn into surgery.  Leave your suitcase in the car.  After surgery it may be brought to your room.  For patients admitted to the hospital, discharge time will be determined by your treatment team.  Patients discharged the day of surgery will not be allowed to drive home and must have someone with them for 24 hours.    Special instructions:   DO NOT smoke tobacco or vape for 24 hours before your procedure.  Please read over the following fact sheets that you were given. Anesthesia Post-op Instructions and Care and Recovery After Surgery      Upper Endoscopy, Adult, Care After After the procedure, it is common to have a sore throat. It is also common to have: Mild stomach pain or discomfort. Bloating. Nausea. Follow these instructions at home: The instructions below may help you care for yourself at home. Your health care provider may give you more instructions. If you have questions, ask your  health care provider. If you were given a sedative during the procedure, it can affect you for several hours. Do not drive or operate machinery until your health care provider says that it is safe. If you will be going home right after the procedure, plan to have a responsible adult: Take you home from the hospital or clinic. You will not be allowed to drive. Care for you for the time you are told. Follow instructions from your health care provider about what you may eat and drink. Return to your normal activities as told by your health care provider. Ask your health care provider what activities are safe for you. Take over-the-counter and prescription medicines only as told by your health care provider. Contact a health care provider if you: Have a sore throat that lasts longer than one day. Have trouble swallowing. Have a fever. Get help right away if you: Vomit blood or your vomit looks like coffee grounds. Have bloody, black, or tarry stools. Have a very bad sore throat or you cannot swallow. Have difficulty breathing or very bad pain in your chest or abdomen. These symptoms may be an emergency. Get help right away. Call 911. Do not wait to see if the symptoms will go away. Do not drive yourself to the hospital. Summary After the procedure, it is common to  have a sore throat, mild stomach discomfort, bloating, and nausea. If you were given a sedative during the procedure, it can affect you for several hours. Do not drive until your health care provider says that it is safe. Follow instructions from your health care provider about what you may eat and drink. Return to your normal activities as told by your health care provider. This information is not intended to replace advice given to you by your health care provider. Make sure you discuss any questions you have with your health care provider. Document Revised: 12/04/2021 Document Reviewed: 12/04/2021 Elsevier Patient Education  [place] After The following information offers guidance on how to care for yourself after your procedure. Your health care provider may also give you more specific instructions. If you have problems or questions, contact your health care provider. What can I expect after the procedure? After the procedure, it is common to have: Tiredness. Little or no memory about what happened during or after the procedure. Impaired judgment when it comes to making decisions. Nausea or vomiting. Some trouble with balance. Follow these instructions at home: For the time period you were told by your health care provider:  Rest. Do not participate in activities where you could fall or become injured. Do not drive or use machinery. Do not drink alcohol. Do not take sleeping pills or medicines that cause drowsiness. Do not make important decisions or sign legal documents. Do not take care of children on your own. Medicines Take over-the-counter and prescription medicines only as told by your health care provider. If you were prescribed antibiotics, take them as told by your health care provider. Do not stop using the antibiotic even if you start to feel better. Eating and drinking Follow instructions from your health care provider about what you may eat and drink. Drink enough fluid to keep your urine pale yellow. If you vomit: Drink clear fluids slowly and in small amounts as you are able. Clear fluids include water, ice chips, low-calorie sports drinks, and fruit juice that has water added to it (diluted fruit juice). Eat light and bland foods in small amounts as you are able. These foods include bananas, applesauce, rice, lean meats, toast, and crackers. General instructions  Have a responsible adult stay with you for the time you are told. It is important to have someone help care for you until you are awake and alert. If you have sleep apnea, surgery and some  medicines can increase your risk for breathing problems. Follow instructions from your health care provider about wearing your sleep device: When you are sleeping. This includes during daytime naps. While taking prescription pain medicines, sleeping medicines, or medicines that make you drowsy. Do not use any products that contain nicotine or tobacco. These products include cigarettes, chewing tobacco, and vaping devices, such as e-cigarettes. If you need help quitting, ask your health care provider. Contact a health care provider if: You feel nauseous or vomit every time you eat or drink. You feel light-headed. You are still sleepy or having trouble with balance after 24 hours. You get a rash. You have a fever. You have redness or swelling around the IV site. Get help right away if: You have trouble breathing. You have new confusion after you get home. These symptoms may be an emergency. Get help right away. Call 911. Do not wait to see if the symptoms will go away. Do not drive yourself to the hospital. This information  is not intended to replace advice given to you by your health care provider. Make sure you discuss any questions you have with your health care provider. Document Revised: 01/20/2022 Document Reviewed: 01/20/2022 Elsevier Patient Education  [place].

## 2022-08-13 IMAGING — US US ABDOMEN LIMITED
1 series · 14 of 25 positions shown · non-contrast
Comparison: Abdominal ultrasound June 06, 2020

CLINICAL DATA: Cirrhosis

EXAM:
ULTRASOUND ABDOMEN LIMITED RIGHT UPPER QUADRANT

[Series 1: us abdomen limited ruq (liver/gb) · 14 of 59 slices shown]
[im 1/59]
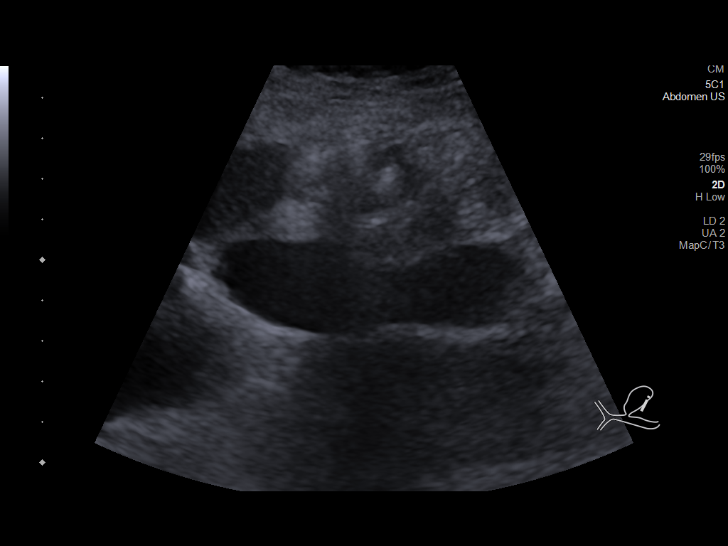
[im 5/59]
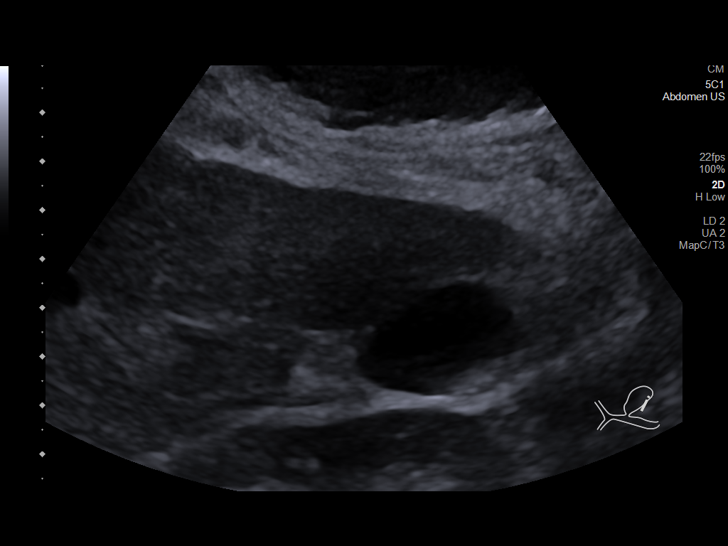
[im 10/59]
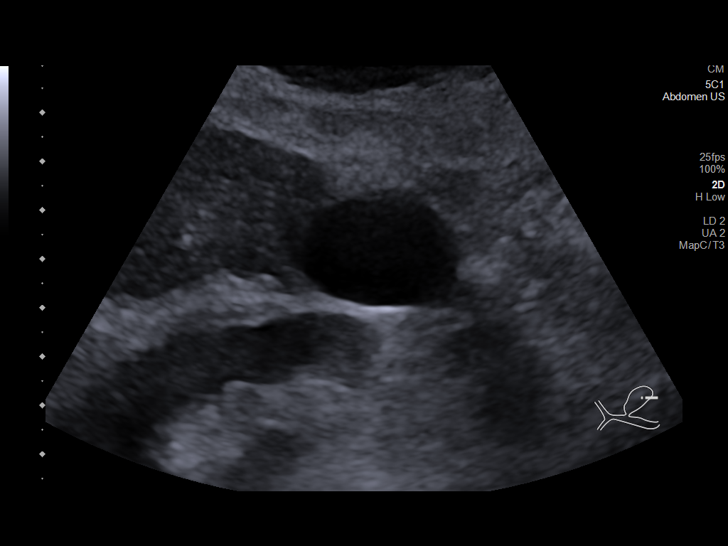
[im 15/59]
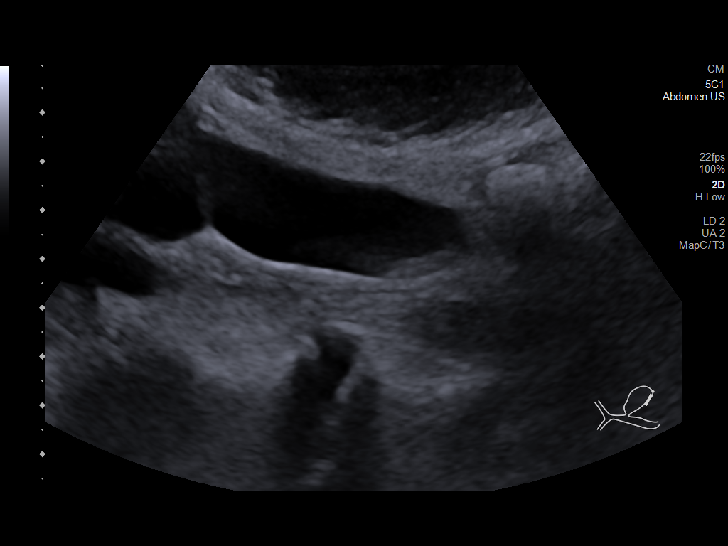
[im 20/59]
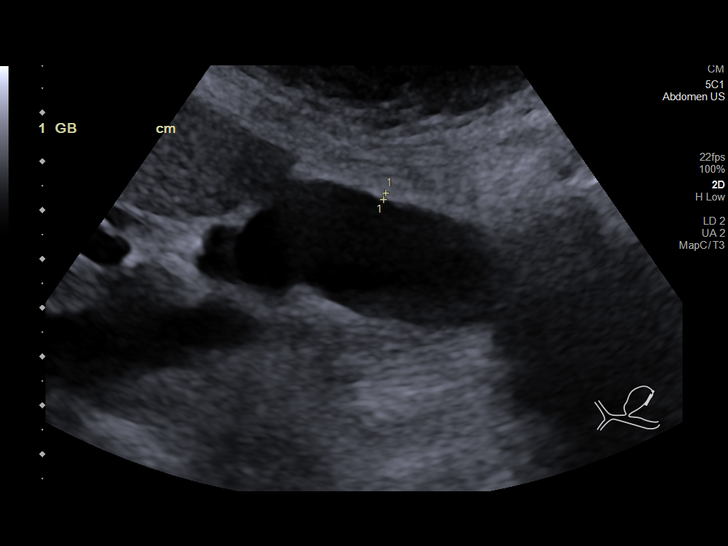
[im 22/59]
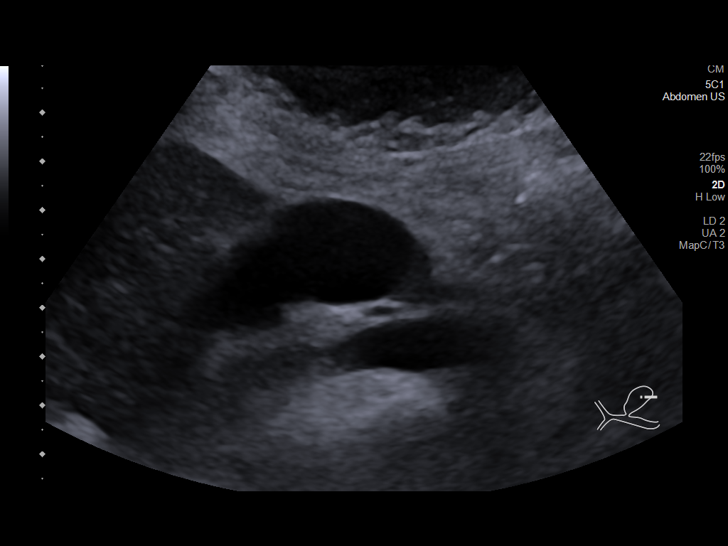
[im 27/59]
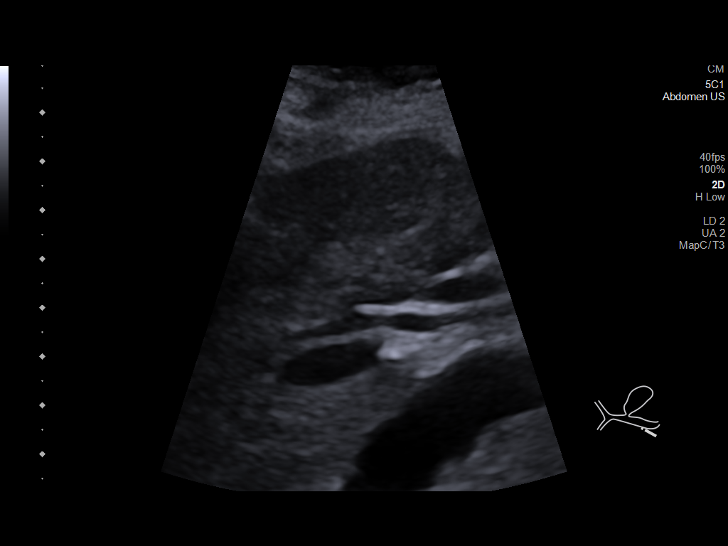
[im 32/59]
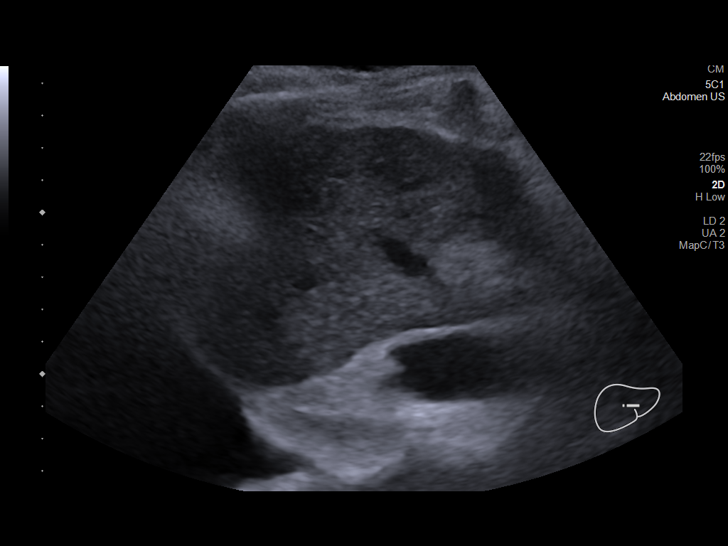
[im 37/59]
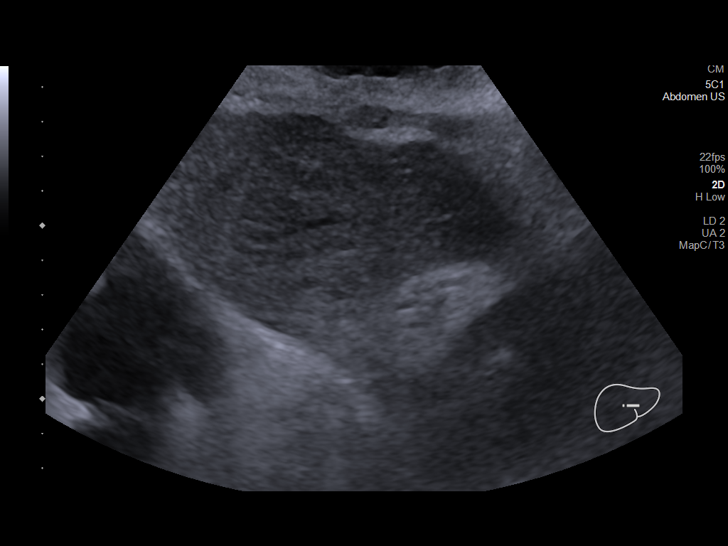
[im 39/59]
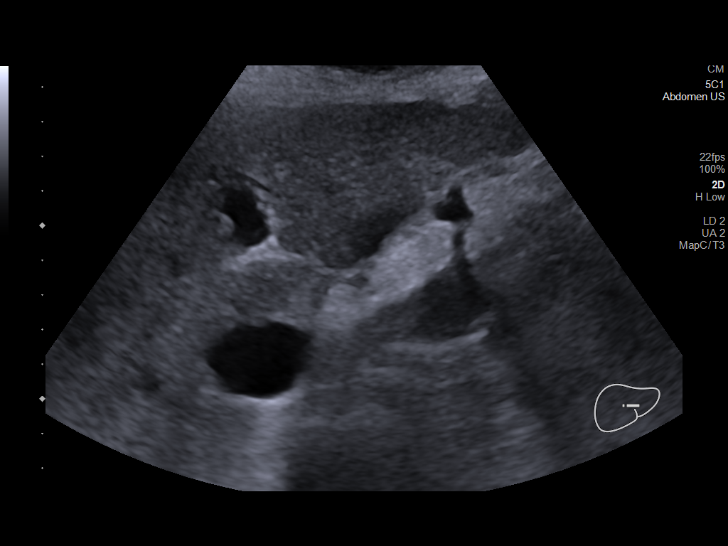
[im 44/59]
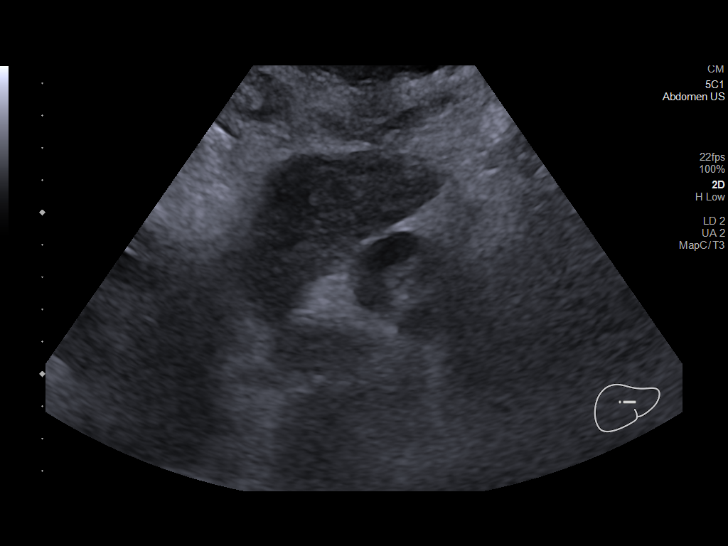
[im 49/59]
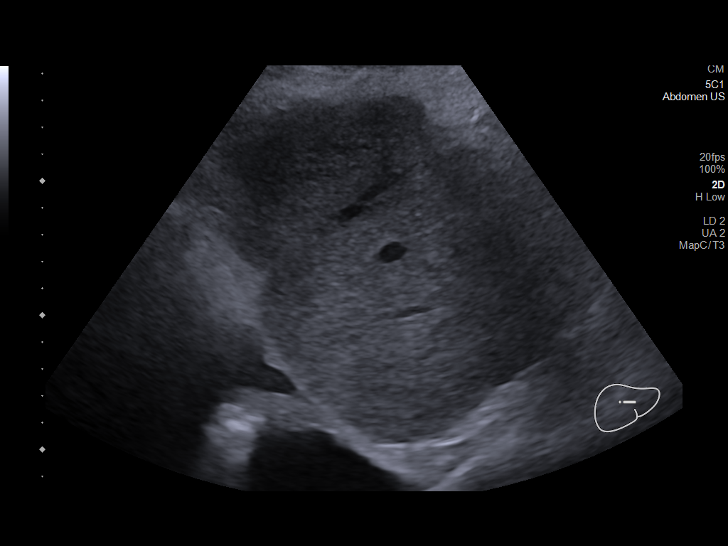
[im 54/59]
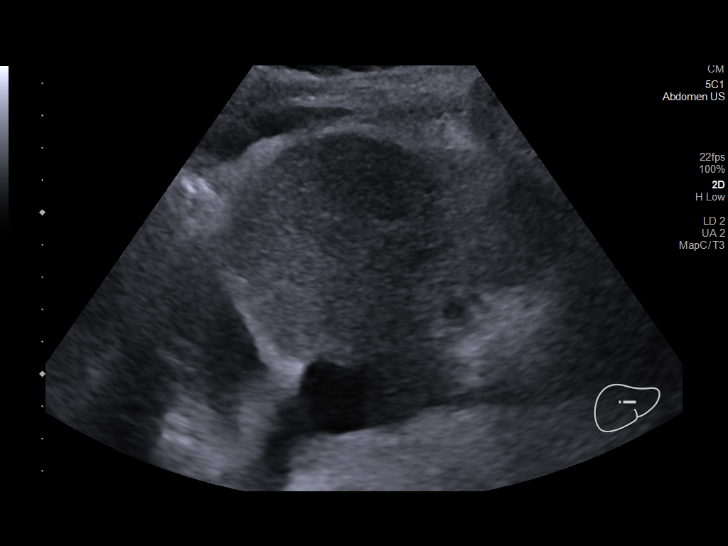
[im 59/59]
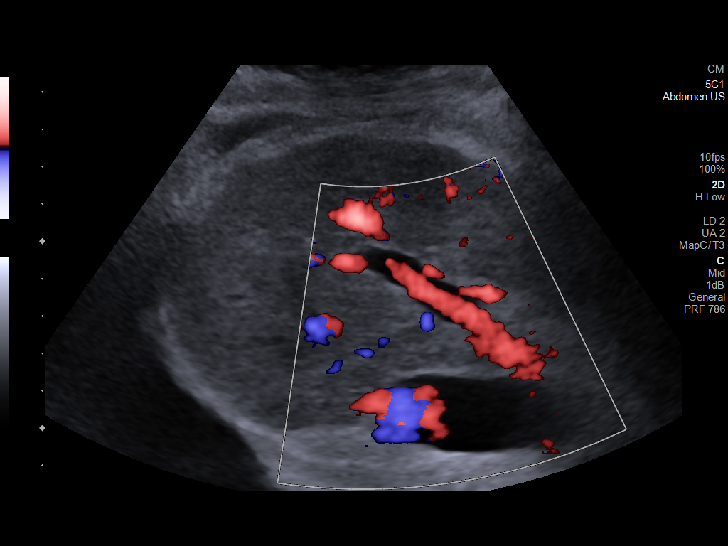

[14 of 25 positions shown; findings below may reference images not displayed]

FINDINGS: Gallbladder:

There is sludge identified in the gallbladder. No wall thickening,
pericholecystic fluid, stones, or Murphy's sign.

Common bile duct:

Diameter: 3.2 mm

Liver:

Heterogeneous echotexture and nodular contour. No focal mass. Portal
vein is patent on color Doppler imaging with normal direction of
blood flow towards the liver.

Other: Right pleural effusion.
IMPRESSION: 1. Sludge in an otherwise normal appearing gallbladder.
2. Cirrhosis with heterogeneous echotexture and a nodular contour.
No focal mass.
3. Right pleural effusion.

## 2022-08-14 ENCOUNTER — Other Ambulatory Visit: Payer: Self-pay | Admitting: Student

## 2022-08-14 ENCOUNTER — Other Ambulatory Visit: Payer: Self-pay | Admitting: Radiology

## 2022-08-14 ENCOUNTER — Encounter (HOSPITAL_COMMUNITY)
Admission: RE | Admit: 2022-08-14 | Discharge: 2022-08-14 | Disposition: A | Discharge status: Home / Self Care | Payer: Medicare Other | Source: Ambulatory Visit | Attending: Gastroenterology | Admitting: Gastroenterology

## 2022-08-14 ENCOUNTER — Encounter (HOSPITAL_COMMUNITY): Payer: Self-pay

## 2022-08-14 VITALS — BP 130/63 | HR 70 | Temp 97.5°F | Resp 18 | Ht 63.0 in | Wt 130.1 lb

## 2022-08-14 DIAGNOSIS — Z01812 Encounter for preprocedural laboratory examination: Secondary | ICD-10-CM | POA: Insufficient documentation

## 2022-08-14 DIAGNOSIS — K7581 Nonalcoholic steatohepatitis (NASH): Secondary | ICD-10-CM | POA: Diagnosis not present

## 2022-08-14 DIAGNOSIS — K769 Liver disease, unspecified: Secondary | ICD-10-CM

## 2022-08-14 HISTORY — DX: Anxiety disorder, unspecified: F41.9

## 2022-08-14 HISTORY — DX: Depression, unspecified: F32.A

## 2022-08-14 LAB — COMPREHENSIVE METABOLIC PANEL
ALT: 36 U/L (ref 0–44)
AST: 53 U/L — ABNORMAL HIGH (ref 15–41)
Albumin: 3 g/dL — ABNORMAL LOW (ref 3.5–5.0)
Alkaline Phosphatase: 153 U/L — ABNORMAL HIGH (ref 38–126)
Anion gap: 7 (ref 5–15)
BUN: 15 mg/dL (ref 8–23)
CO2: 23 mmol/L (ref 22–32)
Calcium: 9.2 mg/dL (ref 8.9–10.3)
Chloride: 107 mmol/L (ref 98–111)
Creatinine, Ser: 0.98 mg/dL (ref 0.44–1.00)
GFR, Estimated: 58 mL/min — ABNORMAL LOW (ref >60)
Glucose, Bld: 111 mg/dL — ABNORMAL HIGH (ref 70–99)
Potassium: 3.7 mmol/L (ref 3.5–5.1)
Sodium: 137 mmol/L (ref 135–145)
Total Bilirubin: 1.5 mg/dL — ABNORMAL HIGH (ref 0.3–1.2)
Total Protein: 6.2 g/dL — ABNORMAL LOW (ref 6.5–8.1)

## 2022-08-14 LAB — CBC WITH DIFFERENTIAL/PLATELET
Abs Immature Granulocytes: 0.01 10*3/uL (ref 0.00–0.07)
Basophils Absolute: 0 10*3/uL (ref 0.0–0.1)
Basophils Relative: 1 %
Eosinophils Absolute: 0.6 10*3/uL — ABNORMAL HIGH (ref 0.0–0.5)
Eosinophils Relative: 10 %
HCT: 39.2 % (ref 36.0–46.0)
Hemoglobin: 13 g/dL (ref 12.0–15.0)
Immature Granulocytes: 0 %
Lymphocytes Relative: 24 %
Lymphs Abs: 1.5 10*3/uL (ref 0.7–4.0)
MCH: 31.6 pg (ref 26.0–34.0)
MCHC: 33.2 g/dL (ref 30.0–36.0)
MCV: 95.4 fL (ref 80.0–100.0)
Monocytes Absolute: 0.6 10*3/uL (ref 0.1–1.0)
Monocytes Relative: 10 %
Neutro Abs: 3.4 10*3/uL (ref 1.7–7.7)
Neutrophils Relative %: 55 %
Platelets: 143 10*3/uL — ABNORMAL LOW (ref 150–400)
RBC: 4.11 MIL/uL (ref 3.87–5.11)
RDW: 14 % (ref 11.5–15.5)
WBC: 6.1 10*3/uL (ref 4.0–10.5)
nRBC: 0 % (ref 0.0–0.2)

## 2022-08-14 LAB — PROTIME-INR
INR: 1.3 — ABNORMAL HIGH (ref 0.8–1.2)
Prothrombin Time: 16.4 seconds — ABNORMAL HIGH (ref 11.4–15.2)

## 2022-08-14 NOTE — H&P (Signed)
Chief Complaint: Left liver lobe lesion. Request is left liver lobe lesion biopsy.  Referring Physician(s): Carlan,Chelsea L  Supervising Physician: Michaelle Birks  Patient Status: [facility] - Out-pt  History of Present Illness: [name] is a 81 y.o. female outpatient. History of CVA, CAD, HLD, DM, A fib, anemia. seizures,  cirrhosis esophageal varices s/p banding. Found to have a left lower lobe liver lesion while being worked up for cirrhosis.  MR liver from 11.17.23 LR-M lesion in the posterior LEFT hepatic lobe. Designated as such based on areas of low T2 signal centrally (a feature that can be seen in fibrous tumors) and potential subtle rim type enhancement. Cholangiocarcinoma in addition to mixed tumor or [place] all remain in the differential and should be correlated with tumor markers and with biopsy as warranted. Team is requesting left lesion liver biopsy.  Currently without any significant complaints. Patient alert and laying in bed,calm. Denies any fevers, headache, chest pain, SOB, cough, abdominal pain, nausea, vomiting or bleeding. Return precautions and treatment recommendations and follow-up discussed with the patient  who is agreeable with the plan.    Past Medical History:  Diagnosis Date   Anemia    Anxiety    Aortic insufficiency    Moderate   Atrial fibrillation (HCC)    Back pain    Carotid stenosis, bilateral    Cirrhosis ([place])    Closed left femoral fracture (HCC) 04/21/2017   Coronary artery disease    Stent x 2 RCA 1995, cardiac catheterization 09/2016 showing only mild atherosclerosis   DDD (degenerative disc disease), lumbar    Depression    Diabetes mellitus without complication (HCC)    Essential hypertension    Falls    GERD (gastroesophageal reflux disease)    Hiatal hernia    History of stroke    Hypercholesteremia    IBS (irritable bowel syndrome)    Non-alcoholic fatty liver disease    OSA (obstructive sleep apnea)    no CPAP    Pericardial effusion    a. small by echo 2018.   PSVT (paroxysmal supraventricular tachycardia)    Recurrent UTI    Seizures ([place]) 2018   Stroke ([place]) 07/2019   several strokes within the last 10 years   Swallowing difficulty    Varices, esophageal ([place])     Past Surgical History:  Procedure Laterality Date   [place]  [id number]   Stent to the proximal RCA after MI    CARDIAC CATHETERIZATION N/A 09/26/2016   Procedure: Left Heart Cath and Coronary Angiography;  Surgeon: Leonie Man, MD;  Location: [place] [facility];  Service: Cardiovascular;  Laterality: N/A;   CATARACT EXTRACTION W/PHACO Right 07/04/2014   Procedure: CATARACT EXTRACTION PHACO AND INTRAOCULAR LENS PLACEMENT (IOC);  Surgeon: Elta Guadeloupe T. Gershon Crane, MD;  Location: [facility];  Service: Ophthalmology;  Laterality: Right;  CDE:13.13   COLONOSCOPY     DILATION AND CURETTAGE OF UTERUS     x2   Epi Retinal Membrane Peel Left    ERCP     ESOPHAGEAL BANDING N/A 04/01/2013   Procedure: ESOPHAGEAL BANDING;  Surgeon: Rogene Houston, MD;  [facility];  Service: Endoscopy;  Laterality: N/A;   ESOPHAGEAL BANDING N/A 05/24/2013   Procedure: ESOPHAGEAL BANDING;  Surgeon: Rogene Houston, MD;  [facility];  Service: Endoscopy;  Laterality: N/A;   ESOPHAGEAL BANDING N/A  06/21/2014   Procedure: ESOPHAGEAL BANDING;  Surgeon: Rogene Houston, MD;  [facility];  Service: Endoscopy;  Laterality: N/A;   ESOPHAGEAL BANDING  06/15/2020   Procedure: ESOPHAGEAL BANDING;  Surgeon: Harvel Quale, MD;  [facility];  Service: Gastroenterology;;   ESOPHAGOGASTRODUODENOSCOPY N/A 04/01/2013   Procedure: ESOPHAGOGASTRODUODENOSCOPY (EGD);  Surgeon: Rogene Houston, MD;  [facility];  Service: Endoscopy;  Laterality: N/A;  230-rescheduled to 8:30am Ann notified pt   ESOPHAGOGASTRODUODENOSCOPY N/A  05/24/2013   Procedure: ESOPHAGOGASTRODUODENOSCOPY (EGD);  Surgeon: Rogene Houston, MD;  [facility];  Service: Endoscopy;  Laterality: N/A;  730   ESOPHAGOGASTRODUODENOSCOPY N/A 06/21/2014   Procedure: ESOPHAGOGASTRODUODENOSCOPY (EGD);  Surgeon: Rogene Houston, MD;  [facility];  Service: Endoscopy;  Laterality: N/A;  930-rescheduled 10/14 @ 1200 Ann to notify pt   ESOPHAGOGASTRODUODENOSCOPY (EGD) WITH PROPOFOL N/A 06/15/2020   Procedure: ESOPHAGOGASTRODUODENOSCOPY (EGD) WITH PROPOFOL;  Surgeon: Harvel Quale, MD;  [facility];  Service: Gastroenterology;  Laterality: N/A;  230   ESOPHAGOGASTRODUODENOSCOPY (EGD) WITH PROPOFOL N/A 07/27/2020   Procedure: ESOPHAGOGASTRODUODENOSCOPY (EGD) WITH PROPOFOL;  Surgeon: Harvel Quale, MD;  [facility];  Service: Gastroenterology;  Laterality: N/A;  1245   EYE SURGERY  08   cataract surgery of the left eye   FEMUR IM NAIL Left 03/02/2017   Procedure: INTRAMEDULLARY (IM) NAIL FEMORAL;  Surgeon: Rod Can, MD;  Location: [facility];  Service: Orthopedics;  Laterality: Left;   HARDWARE REMOVAL Right 01/17/2013   Procedure: REMOVAL OF HARDWARE AND EXCISION ULNAR STYLOID RIGHT WRIST;  Surgeon: Tennis Must, MD;  Location: [place];  Service: Orthopedics;  Laterality: Right;   implanted heart monitor  02/2020   to monitor Afib   MYRINGOTOMY  2012   both ears   ORIF FEMUR FRACTURE Left 04/23/2017   Procedure: OPEN REDUCTION INTERNAL FIXATION (ORIF) DISTAL FEMUR FRACTURE (FRACTURE AROUND FEMORAL NAIL);  Surgeon: Altamese Aberdeen, MD;  Location: [place];  Service: Orthopedics;  Laterality: Left;   TONSILLECTOMY     TUBAL LIGATION     VAGINAL HYSTERECTOMY  1972   WRIST SURGERY     rt wrist hardwear removal    Allergies: Tape  Medications: Prior to Admission medications   Medication Sig Start Date End Date Taking? Authorizing Provider  acetaminophen (TYLENOL) 500 MG  tablet Take 1,000 mg by mouth at bedtime.    [provider]  apixaban (ELIQUIS) 5 MG TABS tablet TAKE (1) TABLET BY MOUTH TWICE DAILY. 02/21/22   Arnoldo Lenis, MD  atorvastatin (LIPITOR) 80 MG tablet Take 1 tablet (80 mg total) by mouth daily. Patient taking differently: Take 80 mg by mouth at bedtime. 07/15/18   Angiulli, Lavon Paganini, PA-C  azelastine (ASTELIN) 0.1 % nasal spray Place 1 spray into both nostrils at bedtime. 11/06/20   [provider]  CALCIUM CITRATE PO Take 200 mg by mouth daily.    [provider]  carvedilol (COREG) 6.25 MG tablet TAKE (1) TABLET BY MOUTH TWICE DAILY WITH A MEAL. 07/30/22   Carlan, Deatra Robinson, NP  Cholecalciferol (VITAMIN D3) 50 MCG (2000 UT) TABS Take 2,000 Units by mouth daily.    [provider]  Coenzyme Q10 (CO Q-10) 100 MG CAPS Take 100 mg by mouth in the morning.    [provider]  diltiazem (CARDIZEM CD) 180 MG 24 hr capsule Take 180 mg by mouth daily. 05/13/22   [provider]  doxepin (SINEQUAN) 25 MG capsule Take 25 mg by mouth at bedtime. 06/24/22   [provider]  doxylamine, Sleep, (UNISOM) 25 MG tablet Take 1 tablet (25 mg total) by mouth at bedtime as needed. Patient taking differently: Take 25 mg by mouth at bedtime. 04/14/22   Barton Dubois, MD  furosemide (LASIX) 40 MG tablet Take 0.5 tablets (20 mg total) by mouth daily. TAKE (1) TABLET BY MOUTH ONCE DAILY. MAY TAKE ADDITIONAL HALF IF NEEDED FOR SEVERE SWELLING. Strength: 20 mg Patient taking differently: Take 20-40 mg by mouth See admin instructions. Alternate taking 20 mg one day and 40 mg the next 04/14/22   Barton Dubois, MD  loperamide (IMODIUM) 2 MG capsule Take 2 mg by mouth as needed for diarrhea or loose stools.    [provider]  Multiple Vitamin (MULTIVITAMIN WITH MINERALS) TABS tablet Take 1 tablet by mouth daily.    [provider]  nitroGLYCERIN (NITROSTAT) 0.4 MG SL tablet Place 0.4 mg under the  tongue every 5 (five) minutes as needed for chest pain. 08/22/20   [provider]  omeprazole (PRILOSEC) 40 MG capsule Take 1 capsule (40 mg total) by mouth daily. 12/30/21   Carlan, Chelsea L, NP  ondansetron (ZOFRAN-ODT) 4 MG disintegrating tablet Take 4 mg by mouth every 8 (eight) hours as needed for nausea or vomiting.    [provider]  OVER THE COUNTER MEDICATION CBD oil tincture 2.1 mg as needed up to three times daily for appetite.    [provider]  Polyethyl Glycol-Propyl Glycol (SYSTANE OP) Place 2 drops into both eyes 3 (three) times daily as needed (dry eyes).    [provider]  polyethylene glycol powder (GLYCOLAX/MIRALAX) 17 GM/SCOOP powder Take 8.5 g by mouth daily. Patient not taking: Reported on 08/12/2022 09/18/20   Rogene Houston, MD  potassium chloride SA (KLOR-CON M) 20 MEQ tablet Take 20 mEq by mouth 2 (two) times daily. 04/19/22   [provider]     Family History  Problem Relation Age of Onset   Diabetes Mother    Heart failure Father    Heart failure Maternal Aunt     Social History   Socioeconomic History   Marital status: Widowed    Spouse name: Not on file   Number of children: 2   Years of education: 2 years college   Highest education level: Some college, no degree  Occupational History    Employer: RETIRED  Tobacco Use   Smoking status: Some Days    Packs/day: 0.25    Years: 50.00    Total pack years: 12.50    Types: Cigarettes    Passive exposure: Current   Smokeless tobacco: Never   Tobacco comments:    12/23/21 1 pack Q two weeks.  Vaping Use   Vaping Use: Never used  Substance and Sexual Activity   Alcohol use: No    Alcohol/week: 0.0 standard drinks of alcohol   Drug use: No   Sexual activity: Not Currently    Birth control/protection: Surgical  Other Topics Concern   Not on file  Social History Narrative   12/23/21  Lives with 3 grandchildren, sone and dgtr-in -law are across the st    Social Determinants of Health   Financial Resource Strain: Low Risk  (07/15/2022)   Overall Financial Resource Strain (CARDIA)    Difficulty of Paying Living Expenses: Not hard at all  Food Insecurity: No Food Insecurity (09/26/2021)   Hunger Vital  Sign    Worried About Charity fundraiser in the Last Year: Never true    [place] in the Last Year: Never true  Transportation Needs: No Transportation Needs (07/15/2022)   PRAPARE - Hydrologist (Medical): No    Lack of Transportation (Non-Medical): No  Physical Activity: Inactive (03/16/2019)   Exercise Vital Sign    Days of Exercise per Week: 0 days    Minutes of Exercise per Session: 0 min  Stress: No Stress Concern Present (03/16/2019)   [place]    Feeling of Stress : Not at all  Social Connections: Moderately Isolated (03/16/2019)   Social Connection and Isolation Panel [NHANES]    Frequency of Communication with Friends and Family: More than three times a week    Frequency of Social Gatherings with Friends and Family: Never    Attends Religious Services: 1 to 4 times per year    Active Member of [employer] or Organizations: No    Attends Archivist Meetings: Never    Marital Status: Widowed     Review of Systems: A 12 point ROS discussed and pertinent positives are indicated in the HPI above.  All other systems are negative.  Review of Systems  Constitutional:  Negative for fatigue and fever.  HENT:  Negative for congestion.   Respiratory:  Negative for cough and shortness of breath.   Gastrointestinal:  Negative for abdominal pain, diarrhea, nausea and vomiting.    Vital Signs: BP (!) 129/39   Pulse (!) 58   Temp 98 F (36.7 C) (Oral)   Resp 17   Ht 5' 3"  (1.6 m)   Wt 135 lb (61.2 kg)   SpO2 98%   BMI 23.91 kg/m    Physical Exam Vitals and nursing note reviewed.  Constitutional:      Appearance: She is  well-developed.  HENT:     Head: Normocephalic and atraumatic.  Eyes:     Conjunctiva/sclera: Conjunctivae normal.  Cardiovascular:     Rate and Rhythm: Normal rate and regular rhythm.     Heart sounds: Normal heart sounds.  Pulmonary:     Effort: Pulmonary effort is normal.     Breath sounds: Normal breath sounds.  Musculoskeletal:        General: Normal range of motion.     Cervical back: Normal range of motion.  Skin:    General: Skin is warm.  Neurological:     Mental Status: She is alert and oriented to person, place, and time.     Imaging: CUP PACEART REMOTE DEVICE CHECK  Result Date: 07/29/2022 ILR summary report received. Battery status OK. Normal device function. No new symptom, tachy, brady, episodes. Persistent AF,  controlled rates, burden 98%, Eliquis.  Presenting rhythm AF. 2 pause events, each 3sec. 1 undersensing, other event occurred 11/18 @ 10:12, no symptom activaition.   Monthly summary reports and ROV/PRN LA  MR LIVER W WO CONTRAST  Result Date: 07/25/2022 CLINICAL DATA:  Cirrhosis, abnormal CT imaging in a 81 year old female. EXAM: MRI ABDOMEN WITHOUT AND WITH CONTRAST TECHNIQUE: Multiplanar multisequence MR imaging of the abdomen was performed both before and after the administration of intravenous contrast. CONTRAST:  24m GADAVIST GADOBUTROL 1 MMOL/ML IV SOLN COMPARISON:  CT of the chest, abdomen and pelvis of April 12, 2022. FINDINGS: Lower chest: Moderate RIGHT and small LEFT-sided effusions. These are larger when compared to prior CT imaging. Heart size  is enlarged. Hepatobiliary: Liver displays a cirrhotic morphology. The portal vein is patent. Sludge layers in the gallbladder and there is no biliary duct distension. Observation 1: A 4.8 x 2.5 cm observation in segment II/III (series 4, image 13, with positive arterial phase hyper-enhancement (APHE) question of mild rim type enhancement, positive washout appearance, no capsule appearance, and new from more  remote imaging from 2019. Ancillary features include: Restricted diffusion. LR - M. Findings suggest the possibility of [place] but the question of rim type enhancement and T2 signal could be seen in the setting of cholangiocarcinoma. AFP and other tumor marker correlation may be helpful with biopsy as warranted. No macrovascular invasion. No additional lesions in the liver. Exam is compromised by respiratory motion on post-contrast imaging. Pancreas: Mild atrophy of the pancreas without ductal dilation, inflammation or visible lesion. Spleen:  Normal size and contour. Adrenals/Urinary Tract: Adrenal glands are normal. Symmetric renal enhancement without hydronephrosis or suspicious renal lesion. Bosniak category I and II cysts are present in the bilateral kidneys for which no additional dedicated follow-up imaging is recommended. Stomach/Bowel: No acute gastrointestinal findings. Limited assessment of gastrointestinal structures on this abdominal MRI performed for liver evaluation. Vascular/Lymphatic: Numerous upper abdominal venous collaterals including large splenorenal shunt and perigastric varices. No adenopathy in the abdomen. Other:  None. Musculoskeletal: No suspicious bone lesions identified. IMPRESSION: 1. LR-M lesion in the posterior LEFT hepatic lobe. Designated as such based on areas of low T2 signal centrally (a feature that can be seen in fibrous tumors) and potential subtle rim type enhancement. Cholangiocarcinoma in addition to mixed tumor or [place] all remain in the differential and should be correlated with tumor markers and with biopsy as warranted. 2. No additional lesions in the liver though study is limited by motion. 3. Advanced cirrhosis with signs of portal hypertension. 4. Enlarging bilateral pleural effusions RIGHT greater than LEFT. Electronically Signed   By: Zetta Bills M.D.   On: 07/25/2022 10:23    Labs:  CBC: Recent Labs    04/12/22 1300 04/13/22 0534 04/14/22 0446  08/14/22 1028  WBC 25.4* 16.4* 10.9* 6.1  HGB 13.2 11.2* 10.9* 13.0  HCT 38.3 32.3* 32.4* 39.2  PLT 121* 86* 85* 143*    COAGS: Recent Labs    12/05/21 2300 04/12/22 1300 08/14/22 1028  INR 1.8* 2.2* 1.3*  APTT 35  --   --     BMP: Recent Labs    04/12/22 1300 04/13/22 0534 04/14/22 0446 08/14/22 1028  NA 138 140 138 137  K 2.3* 2.8* 3.9 3.7  CL 105 109 112* 107  CO2 22 25 22 23   GLUCOSE 164* 133* 113* 111*  BUN 18 21 22 15   CALCIUM 8.9 8.5* 8.3* 9.2  CREATININE 1.46* 1.14* 0.98 0.98  GFRNONAA 36* 49* 59* 58*    LIVER FUNCTION TESTS: Recent Labs    12/06/21 0733 04/12/22 1300 04/14/22 0446 08/14/22 1028  BILITOT 1.4* 3.6* 1.7* 1.5*  AST 25 48* 75* 53*  ALT 17 21 28  36  ALKPHOS 101 101 74 153*  PROT 5.3* 6.1* 5.2* 6.2*  ALBUMIN 2.8* 3.0* 2.3* 3.0*     Assessment and Plan:  81 y.o female outpatient. History of CVA, CAD, HLD, DM, A fib, anemia. seizures,  cirrhosis esophageal varices s/p banding. Found to have a left lower lobe liver lesion while being worked up for cirrhosis.  MR liver from 11.17.23 LR-M lesion in the posterior LEFT hepatic lobe. Designated as such based on areas of low T2 signal  centrally (a feature that can be seen in fibrous tumors) and potential subtle rim type enhancement. Cholangiocarcinoma in addition to mixed tumor or [place] all remain in the differential and should be correlated with tumor markers and with biopsy as warranted. Team is requesting left lesion liver biopsy.   Labs from 12.7.23   AST 53. albumin 3.0, Total bilirubin 1.5., alkaline phosphatase 153. All other labs acceptable parameters. Patient is on eliquis (for a fib). Last dose on 12.5.23. Allergies include tape. Patient has been NPO since midnight.  Risks and benefits of left liver lesion biopsy was discussed with the patient and/or patient's family including, but not limited to bleeding, infection, damage to adjacent structures or low yield requiring additional  tests.  All of the questions were answered and there is agreement to proceed.  Consent signed and in chart.   Thank you for this interesting consult.  I greatly enjoyed meeting [facility] and look forward to participating in their care.  A copy of this report was sent to the requesting provider on this date.  Electronically Signed: Jacqualine Mau, NP 08/15/2022, 7:49 AM   I spent a total of  30 Minutes   in face to face in clinical consultation, greater than 50% of which was counseling/coordinating care for liver lesion biopsy

## 2022-08-15 ENCOUNTER — Other Ambulatory Visit (INDEPENDENT_AMBULATORY_CARE_PROVIDER_SITE_OTHER): Payer: Self-pay | Admitting: Gastroenterology

## 2022-08-15 ENCOUNTER — Encounter (HOSPITAL_COMMUNITY): Payer: Self-pay

## 2022-08-15 ENCOUNTER — Ambulatory Visit (HOSPITAL_COMMUNITY)
Admission: RE | Admit: 2022-08-15 | Discharge: 2022-08-15 | Disposition: A | Discharge status: Home / Self Care | Payer: Medicare Other | Source: Ambulatory Visit | Attending: Gastroenterology | Admitting: Gastroenterology

## 2022-08-15 DIAGNOSIS — E785 Hyperlipidemia, unspecified: Secondary | ICD-10-CM | POA: Insufficient documentation

## 2022-08-15 DIAGNOSIS — Z7901 Long term (current) use of anticoagulants: Secondary | ICD-10-CM | POA: Diagnosis not present

## 2022-08-15 DIAGNOSIS — D649 Anemia, unspecified: Secondary | ICD-10-CM | POA: Diagnosis not present

## 2022-08-15 DIAGNOSIS — Z8673 Personal history of transient ischemic attack (TIA), and cerebral infarction without residual deficits: Secondary | ICD-10-CM | POA: Insufficient documentation

## 2022-08-15 DIAGNOSIS — I4891 Unspecified atrial fibrillation: Secondary | ICD-10-CM | POA: Insufficient documentation

## 2022-08-15 DIAGNOSIS — K7460 Unspecified cirrhosis of liver: Secondary | ICD-10-CM | POA: Insufficient documentation

## 2022-08-15 DIAGNOSIS — E119 Type 2 diabetes mellitus without complications: Secondary | ICD-10-CM | POA: Insufficient documentation

## 2022-08-15 DIAGNOSIS — I8500 Esophageal varices without bleeding: Secondary | ICD-10-CM | POA: Diagnosis not present

## 2022-08-15 DIAGNOSIS — Z7984 Long term (current) use of oral hypoglycemic drugs: Secondary | ICD-10-CM | POA: Insufficient documentation

## 2022-08-15 DIAGNOSIS — Z79899 Other long term (current) drug therapy: Secondary | ICD-10-CM | POA: Diagnosis not present

## 2022-08-15 DIAGNOSIS — R569 Unspecified convulsions: Secondary | ICD-10-CM | POA: Diagnosis not present

## 2022-08-15 DIAGNOSIS — K769 Liver disease, unspecified: Secondary | ICD-10-CM | POA: Diagnosis not present

## 2022-08-15 DIAGNOSIS — I2510 Atherosclerotic heart disease of native coronary artery without angina pectoris: Secondary | ICD-10-CM | POA: Diagnosis not present

## 2022-08-15 DIAGNOSIS — R160 Hepatomegaly, not elsewhere classified: Secondary | ICD-10-CM | POA: Diagnosis not present

## 2022-08-15 DIAGNOSIS — C220 Liver cell carcinoma: Secondary | ICD-10-CM | POA: Diagnosis not present

## 2022-08-15 LAB — GLUCOSE, CAPILLARY: Glucose-Capillary: 118 mg/dL — ABNORMAL HIGH (ref 70–99)

## 2022-08-15 MED ORDER — GELATIN ABSORBABLE 12-7 MM EX MISC
CUTANEOUS | Status: AC
  Filled 2022-08-15: qty 1

## 2022-08-15 MED ORDER — MIDAZOLAM HCL 2 MG/2ML IJ SOLN
INTRAMUSCULAR | Status: AC
  Filled 2022-08-15: qty 2

## 2022-08-15 MED ORDER — LIDOCAINE HCL (PF) 1 % IJ SOLN: 5.0000 mL | Freq: Once | INTRAMUSCULAR | Status: DC

## 2022-08-15 MED ORDER — GELATIN ABSORBABLE 12-7 MM EX MISC: 1.0000 | Freq: Once | CUTANEOUS | Status: DC

## 2022-08-15 MED ORDER — MIDAZOLAM HCL 2 MG/2ML IJ SOLN
INTRAMUSCULAR | Status: AC | PRN
  Administered 2022-08-15 (×2): .5 mg via INTRAVENOUS

## 2022-08-15 MED ORDER — LIDOCAINE HCL (PF) 1 % IJ SOLN
INTRAMUSCULAR | Status: AC
  Filled 2022-08-15: qty 30

## 2022-08-15 MED ORDER — FENTANYL CITRATE (PF) 100 MCG/2ML IJ SOLN
INTRAMUSCULAR | Status: AC | PRN
  Administered 2022-08-15: 25 ug via INTRAVENOUS

## 2022-08-15 MED ORDER — FENTANYL CITRATE (PF) 100 MCG/2ML IJ SOLN
INTRAMUSCULAR | Status: AC
  Filled 2022-08-15: qty 2

## 2022-08-15 MED ORDER — SODIUM CHLORIDE 0.9 % IV SOLN: INTRAVENOUS | Status: DC

## 2022-08-15 NOTE — Procedures (Signed)
Vascular and Interventional Radiology Procedure Note  Patient: [name] DOB: 1940/10/05 Medical Record Number: [id number] Note Date/Time: 08/15/22 8:37 AM   Performing Physician: Michaelle Birks, MD Assistant(s): None  Diagnosis: Cirrhosis w liver mass. LR-M read on MR   Procedure: LIVER MASS BIOPSY  Anesthesia: Conscious Sedation Complications: None Estimated Blood Loss: Minimal Specimens: Sent for Pathology  Findings:  Successful Ultrasound-guided biopsy of LEFT hepatic lobe mass. A total of 4 samples were obtained. Hemostasis of the tract was achieved using Gelfoam Slurry Embolization.  Plan: Bed rest for 2 hours.  See detailed procedure note with images in PACS. The patient tolerated the procedure well without incident or complication and was returned to Recovery in stable condition.    Michaelle Birks, MD [facility] Radiology   Pager. [place]

## 2022-08-15 NOTE — Progress Notes (Signed)
Patient and grand daughter was given discharge instructions. Both verbalized understanding.

## 2022-08-18 LAB — SURGICAL PATHOLOGY

## 2022-08-19 ENCOUNTER — Other Ambulatory Visit: Payer: Self-pay

## 2022-08-19 ENCOUNTER — Encounter (HOSPITAL_COMMUNITY)
Admission: RE | Disposition: A | Discharge status: Home / Self Care | Payer: Self-pay | Source: Home / Self Care | Attending: Gastroenterology

## 2022-08-19 ENCOUNTER — Telehealth (INDEPENDENT_AMBULATORY_CARE_PROVIDER_SITE_OTHER): Payer: Self-pay

## 2022-08-19 ENCOUNTER — Other Ambulatory Visit: Payer: Self-pay | Admitting: Gastroenterology

## 2022-08-19 ENCOUNTER — Ambulatory Visit (HOSPITAL_COMMUNITY): Payer: Medicare Other | Admitting: Anesthesiology

## 2022-08-19 ENCOUNTER — Ambulatory Visit (HOSPITAL_COMMUNITY)
Admission: RE | Admit: 2022-08-19 | Discharge: 2022-08-19 | Disposition: A | Discharge status: Home / Self Care | Payer: Medicare Other | Attending: Gastroenterology | Admitting: Gastroenterology

## 2022-08-19 ENCOUNTER — Ambulatory Visit (HOSPITAL_BASED_OUTPATIENT_CLINIC_OR_DEPARTMENT_OTHER): Payer: Medicare Other | Admitting: Anesthesiology

## 2022-08-19 ENCOUNTER — Encounter (HOSPITAL_COMMUNITY): Payer: Self-pay | Admitting: Gastroenterology

## 2022-08-19 DIAGNOSIS — Z8673 Personal history of transient ischemic attack (TIA), and cerebral infarction without residual deficits: Secondary | ICD-10-CM | POA: Insufficient documentation

## 2022-08-19 DIAGNOSIS — I252 Old myocardial infarction: Secondary | ICD-10-CM | POA: Diagnosis not present

## 2022-08-19 DIAGNOSIS — E119 Type 2 diabetes mellitus without complications: Secondary | ICD-10-CM | POA: Insufficient documentation

## 2022-08-19 DIAGNOSIS — K449 Diaphragmatic hernia without obstruction or gangrene: Secondary | ICD-10-CM

## 2022-08-19 DIAGNOSIS — Z79899 Other long term (current) drug therapy: Secondary | ICD-10-CM | POA: Insufficient documentation

## 2022-08-19 DIAGNOSIS — I10 Essential (primary) hypertension: Secondary | ICD-10-CM | POA: Diagnosis not present

## 2022-08-19 DIAGNOSIS — Z8669 Personal history of other diseases of the nervous system and sense organs: Secondary | ICD-10-CM | POA: Insufficient documentation

## 2022-08-19 DIAGNOSIS — I8500 Esophageal varices without bleeding: Secondary | ICD-10-CM

## 2022-08-19 DIAGNOSIS — I4891 Unspecified atrial fibrillation: Secondary | ICD-10-CM | POA: Insufficient documentation

## 2022-08-19 DIAGNOSIS — K2100 Gastro-esophageal reflux disease with esophagitis, without bleeding: Secondary | ICD-10-CM | POA: Diagnosis not present

## 2022-08-19 DIAGNOSIS — E7800 Pure hypercholesterolemia, unspecified: Secondary | ICD-10-CM | POA: Insufficient documentation

## 2022-08-19 DIAGNOSIS — G4730 Sleep apnea, unspecified: Secondary | ICD-10-CM | POA: Diagnosis not present

## 2022-08-19 DIAGNOSIS — F17200 Nicotine dependence, unspecified, uncomplicated: Secondary | ICD-10-CM | POA: Diagnosis not present

## 2022-08-19 DIAGNOSIS — Z8249 Family history of ischemic heart disease and other diseases of the circulatory system: Secondary | ICD-10-CM | POA: Diagnosis not present

## 2022-08-19 DIAGNOSIS — I2510 Atherosclerotic heart disease of native coronary artery without angina pectoris: Secondary | ICD-10-CM | POA: Diagnosis not present

## 2022-08-19 DIAGNOSIS — K759 Inflammatory liver disease, unspecified: Secondary | ICD-10-CM | POA: Diagnosis not present

## 2022-08-19 DIAGNOSIS — Z09 Encounter for follow-up examination after completed treatment for conditions other than malignant neoplasm: Secondary | ICD-10-CM | POA: Diagnosis not present

## 2022-08-19 DIAGNOSIS — R630 Anorexia: Secondary | ICD-10-CM

## 2022-08-19 DIAGNOSIS — C220 Liver cell carcinoma: Secondary | ICD-10-CM

## 2022-08-19 DIAGNOSIS — Z833 Family history of diabetes mellitus: Secondary | ICD-10-CM | POA: Diagnosis not present

## 2022-08-19 DIAGNOSIS — F17210 Nicotine dependence, cigarettes, uncomplicated: Secondary | ICD-10-CM | POA: Diagnosis not present

## 2022-08-19 HISTORY — PX: ESOPHAGOGASTRODUODENOSCOPY (EGD) WITH PROPOFOL: SHX5813

## 2022-08-19 SURGERY — ESOPHAGOGASTRODUODENOSCOPY (EGD) WITH PROPOFOL
Anesthesia: General

## 2022-08-19 MED ORDER — PROPOFOL 10 MG/ML IV BOLUS
INTRAVENOUS | Status: DC | PRN
  Administered 2022-08-19: 20 mg via INTRAVENOUS
  Administered 2022-08-19: 30 mg via INTRAVENOUS

## 2022-08-19 MED ORDER — LACTATED RINGERS IV SOLN: INTRAVENOUS | Status: DC

## 2022-08-19 MED ORDER — LACTULOSE 10 GM/15ML PO SOLN: 20.0000 g | Freq: Two times a day (BID) | ORAL | 1 refills | Status: AC

## 2022-08-19 MED ORDER — PROPOFOL 500 MG/50ML IV EMUL
INTRAVENOUS | Status: DC | PRN
  Administered 2022-08-19: 75 ug/kg/min via INTRAVENOUS

## 2022-08-19 MED ORDER — LACTATED RINGERS IV SOLN: INTRAVENOUS | Status: DC | PRN

## 2022-08-19 NOTE — Telephone Encounter (Signed)
Granddaughter Gari Crown called today wants to know if the patient is taking lactulose should she be taking Miralax as well? Please advise.

## 2022-08-19 NOTE — Op Note (Addendum)
[facility] Patient Name: [name] Procedure Date: 08/19/2022 7:13 AM MRN: [id number] Date of Birth: 1940/11/05 Attending MD: Maylon Peppers , , [id number] CSN: [id number] Age: 81 Admit Type: Outpatient Procedure:                Upper GI endoscopy Indications:              Follow-up of esophageal varices Providers:                Maylon Peppers, Caprice Kluver, Aram Candela Referring MD:              Medicines:                Monitored Anesthesia Care Complications:            No immediate complications. Estimated Blood Loss:     Estimated blood loss: none. Procedure:                Pre-Anesthesia Assessment:                           - Prior to the procedure, a History and Physical                            was performed, and patient medications, allergies                            and sensitivities were reviewed. The patient's                            tolerance of previous anesthesia was reviewed.                           - The risks and benefits of the procedure and the                            sedation options and risks were discussed with the                            patient. All questions were answered and informed                            consent was obtained.                           - ASA Grade Assessment: III - A patient with severe                            systemic disease.                           After obtaining informed consent, the endoscope was                            passed under direct vision. Throughout the                            procedure, the patient's blood  pressure, pulse, and                            oxygen saturations were monitored continuously. The                            [id number]-[id number] ([id number]) scope was introduced through the                            mouth, and advanced to the second part of duodenum.                            The upper GI endoscopy was accomplished without                            difficulty. The  patient tolerated the procedure                            well. Scope In: 7:38:54 AM Scope Out: 7:42:11 AM Total Procedure Duration: 0 hours 3 minutes 17 seconds  Findings:      Grade I varices were found in the lower third of the esophagus.      A 3 cm hiatal hernia was present.      The stomach was normal.      The examined duodenum was normal. Impression:               - Grade I esophageal varices.                           - 3 cm hiatal hernia.                           - Normal stomach.                           - Normal examined duodenum.                           - No specimens collected. Moderate Sedation:      Per Anesthesia Care Recommendation:           - Discharge patient to home.                           - Resume previous diet.                           - Continue present medications, including                            carvedilol 6.25 mg BID.                           - Will discuss possibility of TACE/MWA/Y90                            intevention +/- chemotherapy for newly diagnosed  [place] with IR and oncology.                           -Restart Eliquis tonight.                           -Start Lactulose 20 g BId for possible HE. Procedure Code(s):        --- Professional ---                           [phone], Esophagogastroduodenoscopy, flexible,                            transoral; diagnostic, including collection of                            specimen(s) by brushing or washing, when performed                            (separate procedure) Diagnosis Code(s):        --- Professional ---                           I85.00, Esophageal varices without bleeding                           K44.9, Diaphragmatic hernia without obstruction or                            gangrene CPT copyright 2022 American Medical Association. All rights reserved. The codes documented in this report are preliminary and upon coder review may  be revised to meet  current compliance requirements. Maylon Peppers, MD Maylon Peppers,  08/19/2022 7:52:36 AM This report has been signed electronically. Number of Addenda: 0

## 2022-08-19 NOTE — Anesthesia Preprocedure Evaluation (Signed)
Anesthesia Evaluation  Patient identified by MRN, date of birth, ID band Patient awake    Reviewed: Allergy & Precautions, NPO status , Patient's Chart, lab work & pertinent test results  Airway Mallampati: II  TM Distance: >3 FB Neck ROM: Full    Dental  (+) Dental Advisory Given, Teeth Intact   Pulmonary sleep apnea , Current Smoker   breath sounds clear to auscultation       Cardiovascular Exercise Tolerance: Poor hypertension, Pt. on medications + CAD and + Past MI  Normal cardiovascular examSupra Ventricular Tachycardia  Rhythm:Regular Rate:Normal  1. Left ventricular ejection fraction, by estimation, is 60 to 65%. The  left ventricle has normal function. The left ventricle has no regional  wall motion abnormalities. There is mild left ventricular hypertrophy.  Left ventricular diastolic parameters  are consistent with Grade II diastolic dysfunction (pseudonormalization).  Elevated left atrial pressure.  2. Right ventricular systolic function is normal. The right ventricular  size is normal. There is mildly elevated pulmonary artery systolic  pressure.  3. Left atrial size was severely dilated.  4. The mitral valve was not well visualized. Mild mitral valve  regurgitation. Moderate mitral stenosis.MV peak gradient, 13.4 mmHg. The  mean mitral valve gradient is 5.0 mmHg.  5. The aortic valve was not well visualized. Aortic valve regurgitation  is moderate. Moderate aortic valve stenosis.  6. The inferior vena cava is normal in size with greater than 50%  respiratory variability, suggesting right atrial pressure of 3 mmHg.    Neuro/Psych Seizures -, Well Controlled,  PSYCHIATRIC DISORDERS Anxiety Depression    TIACVA    GI/Hepatic Neg liver ROS, hiatal hernia,GERD  ,,(+) Hepatitis -Non alcoholic   Endo/Other  negative endocrine ROSdiabetes    Renal/GU negative Renal ROS     Musculoskeletal  (+) Arthritis ,     Abdominal   Peds  Hematology  (+) Blood dyscrasia, anemia   Anesthesia Other Findings   Reproductive/Obstetrics                             Anesthesia Physical Anesthesia Plan  ASA: 3  Anesthesia Plan: General   Post-op Pain Management:    Induction: Intravenous  PONV Risk Score and Plan: 1 and TIVA  Airway Management Planned: Nasal Cannula and Natural Airway  Additional Equipment:   Intra-op Plan:   Post-operative Plan:   Informed Consent: I have reviewed the patients History and Physical, chart, labs and discussed the procedure including the risks, benefits and alternatives for the proposed anesthesia with the patient or authorized representative who has indicated his/her understanding and acceptance.     Dental advisory given  Plan Discussed with: CRNA  Anesthesia Plan Comments:         Anesthesia Quick Evaluation

## 2022-08-19 NOTE — Anesthesia Procedure Notes (Signed)
Procedure Name: MAC Date/Time: 08/19/2022 7:40 AM  Performed by: Lieutenant Diego, CRNAPre-anesthesia Checklist: Patient identified, Emergency Drugs available, Suction available, Patient being monitored and Timeout performed Patient Re-evaluated:Patient Re-evaluated prior to induction Oxygen Delivery Method: Nasal cannula Preoxygenation: Pre-oxygenation with 100% oxygen Induction Type: IV induction

## 2022-08-19 NOTE — Transfer of Care (Signed)
Immediate Anesthesia Transfer of Care Note  Patient: [name]  Procedure(s) Performed: ESOPHAGOGASTRODUODENOSCOPY (EGD) WITH PROPOFOL  Patient Location: PACU  Anesthesia Type:MAC  Level of Consciousness: drowsy  Airway & Oxygen Therapy: Patient Spontanous Breathing and Patient connected to nasal cannula oxygen  Post-op Assessment: Report given to RN and Post -op Vital signs reviewed and stable  Post vital signs: Reviewed and stable  Last Vitals:  Vitals Value Taken Time  BP    Temp    Pulse    Resp    SpO2      Last Pain:  Vitals:   08/19/22 0733  TempSrc:   PainSc: 0-No pain      Patients Stated Pain Goal: 6 (22/63/33 5456)  Complications: No notable events documented.

## 2022-08-19 NOTE — H&P (Signed)
[name] is an 81 y.o. female.   Chief Complaint: esophageal varices HPI: 81 y/o F with PMH atrial fibrillation and nonbleeding esophageal varices, cirrhosis c/b HCC, hyperlipidemia, history of stroke, GERD, diabetes, depression, aortic insufficiency, seizures, coming for follow-up of esophageal varices.   The patient denies having any complaints such as melena, hematochezia, abdominal pain or distention, change in her bowel movement consistency or frequency, no changes in weight recently.  Patient had last EGD on 07/27/2020.  Had grade 1 esophageal varices with associated esophagitis.  Patient was advised to have repeat EGD but was lost to follow-up.   Past Medical History:  Diagnosis Date   Anemia    Anxiety    Aortic insufficiency    Moderate   Atrial fibrillation (HCC)    Back pain    Carotid stenosis, bilateral    Cirrhosis ([place])    Closed left femoral fracture (HCC) 04/21/2017   Coronary artery disease    Stent x 2 RCA 1995, cardiac catheterization 09/2016 showing only mild atherosclerosis   DDD (degenerative disc disease), lumbar    Depression    Diabetes mellitus without complication (HCC)    Essential hypertension    Falls    GERD (gastroesophageal reflux disease)    Hiatal hernia    History of stroke    Hypercholesteremia    IBS (irritable bowel syndrome)    Non-alcoholic fatty liver disease    OSA (obstructive sleep apnea)    no CPAP   Pericardial effusion    a. small by echo 2018.   PSVT (paroxysmal supraventricular tachycardia)    Recurrent UTI    Seizures ([place]) 2018   Stroke ([place]) 07/2019   several strokes within the last 10 years   Swallowing difficulty    Varices, esophageal ([place])     Past Surgical History:  Procedure Laterality Date   [place]  [phone]   Stent to the proximal RCA after MI    CARDIAC CATHETERIZATION N/A 09/26/2016   Procedure: Left Heart  Cath and Coronary Angiography;  Surgeon: Leonie Man, MD;  Location: [facility];  Service: Cardiovascular;  Laterality: N/A;   CATARACT EXTRACTION W/PHACO Right 07/04/2014   Procedure: CATARACT EXTRACTION PHACO AND INTRAOCULAR LENS PLACEMENT (IOC);  Surgeon: Elta Guadeloupe T. Gershon Crane, MD;  Location: [facility];  Service: Ophthalmology;  Laterality: Right;  CDE:13.13   COLONOSCOPY     DILATION AND CURETTAGE OF UTERUS     x2   Epi Retinal Membrane Peel Left    ERCP     ESOPHAGEAL BANDING N/A 04/01/2013   Procedure: ESOPHAGEAL BANDING;  Surgeon: Rogene Houston, MD;  [facility];  Service: Endoscopy;  Laterality: N/A;   ESOPHAGEAL BANDING N/A 05/24/2013   Procedure: ESOPHAGEAL BANDING;  Surgeon: Rogene Houston, MD;  [facility];  Service: Endoscopy;  Laterality: N/A;   ESOPHAGEAL BANDING N/A 06/21/2014   Procedure: ESOPHAGEAL BANDING;  Surgeon: Rogene Houston, MD;  [facility];  Service: Endoscopy;  Laterality: N/A;   ESOPHAGEAL BANDING  06/15/2020   Procedure: ESOPHAGEAL BANDING;  Surgeon: Harvel Quale, MD;  [facility];  Service: Gastroenterology;;   ESOPHAGOGASTRODUODENOSCOPY N/A 04/01/2013   Procedure: ESOPHAGOGASTRODUODENOSCOPY (EGD);  Surgeon: Rogene Houston, MD;  [facility];  Service: Endoscopy;  Laterality: N/A;  230-rescheduled to 8:30am Ann notified pt   ESOPHAGOGASTRODUODENOSCOPY N/A 05/24/2013  Procedure: ESOPHAGOGASTRODUODENOSCOPY (EGD);  Surgeon: Rogene Houston, MD;  [facility];  Service: Endoscopy;  Laterality: N/A;  730   ESOPHAGOGASTRODUODENOSCOPY N/A 06/21/2014   Procedure: ESOPHAGOGASTRODUODENOSCOPY (EGD);  Surgeon: Rogene Houston, MD;  [facility];  Service: Endoscopy;  Laterality: N/A;  930-rescheduled 10/14 @ 1200 Ann to notify pt   ESOPHAGOGASTRODUODENOSCOPY (EGD) WITH PROPOFOL N/A 06/15/2020   Procedure: ESOPHAGOGASTRODUODENOSCOPY (EGD) WITH PROPOFOL;  Surgeon: Harvel Quale, MD;  [facility];  Service: Gastroenterology;  Laterality: N/A;  230   ESOPHAGOGASTRODUODENOSCOPY (EGD) WITH PROPOFOL N/A 07/27/2020   Procedure: ESOPHAGOGASTRODUODENOSCOPY (EGD) WITH PROPOFOL;  Surgeon: Harvel Quale, MD;  [facility];  Service: Gastroenterology;  Laterality: N/A;  1245   EYE SURGERY  08   cataract surgery of the left eye   FEMUR IM NAIL Left 03/02/2017   Procedure: INTRAMEDULLARY (IM) NAIL FEMORAL;  Surgeon: Rod Can, MD;  Location: [facility];  Service: Orthopedics;  Laterality: Left;   HARDWARE REMOVAL Right 01/17/2013   Procedure: REMOVAL OF HARDWARE AND EXCISION ULNAR STYLOID RIGHT WRIST;  Surgeon: Tennis Must, MD;  Location: [facility];  Service: Orthopedics;  Laterality: Right;   implanted heart monitor  02/2020   to monitor Afib   MYRINGOTOMY  2012   both ears   ORIF FEMUR FRACTURE Left 04/23/2017   Procedure: OPEN REDUCTION INTERNAL FIXATION (ORIF) DISTAL FEMUR FRACTURE (FRACTURE AROUND FEMORAL NAIL);  Surgeon: Altamese [place], MD;  Location: [facility];  Service: Orthopedics;  Laterality: Left;   TONSILLECTOMY     TUBAL LIGATION     VAGINAL HYSTERECTOMY  1972   WRIST SURGERY     rt wrist hardwear removal    Family History  Problem Relation Age of Onset   Diabetes Mother    Heart failure Father    Heart failure Maternal Aunt    Social History:  reports that she has been smoking cigarettes. She has a 12.50 pack-year smoking history. She has been exposed to tobacco smoke. She has never used smokeless tobacco. She reports that she does not drink alcohol and does not use drugs.  Allergies:  Allergies  Allergen Reactions   Tape Rash and Other (See Comments)    Old paper tape caused a reaction, doesn't react to most other tapes     Medications Prior to Admission  Medication Sig Dispense Refill   acetaminophen (TYLENOL) 500 MG tablet Take 1,000 mg by mouth at bedtime.     apixaban (ELIQUIS) 5 MG  TABS tablet TAKE (1) TABLET BY MOUTH TWICE DAILY. 60 tablet 5   atorvastatin (LIPITOR) 80 MG tablet Take 1 tablet (80 mg total) by mouth daily. (Patient taking differently: Take 80 mg by mouth at bedtime.) 30 tablet 1   azelastine (ASTELIN) 0.1 % nasal spray Place 1 spray into both nostrils at bedtime.     CALCIUM CITRATE PO Take 200 mg by mouth daily.     carvedilol (COREG) 6.25 MG tablet TAKE (1) TABLET BY MOUTH TWICE DAILY WITH A MEAL. 180 tablet 0   Cholecalciferol (VITAMIN D3) 50 MCG (2000 UT) TABS Take 2,000 Units by mouth daily.     Coenzyme Q10 (CO Q-10) 100 MG CAPS Take 100 mg by mouth in the morning.     diltiazem (CARDIZEM CD) 180 MG 24 hr capsule Take 180 mg by mouth daily.     doxepin (SINEQUAN) 25 MG capsule Take 25 mg by mouth at bedtime.     doxylamine, Sleep, (  UNISOM) 25 MG tablet Take 1 tablet (25 mg total) by mouth at bedtime as needed. (Patient taking differently: Take 25 mg by mouth at bedtime.)     furosemide (LASIX) 40 MG tablet Take 0.5 tablets (20 mg total) by mouth daily. TAKE (1) TABLET BY MOUTH ONCE DAILY. MAY TAKE ADDITIONAL HALF IF NEEDED FOR SEVERE SWELLING. Strength: 20 mg (Patient taking differently: Take 20-40 mg by mouth See admin instructions. Alternate taking 20 mg one day and 40 mg the next) 45 tablet 0   loperamide (IMODIUM) 2 MG capsule Take 2 mg by mouth as needed for diarrhea or loose stools.     Multiple Vitamin (MULTIVITAMIN WITH MINERALS) TABS tablet Take 1 tablet by mouth daily.     nitroGLYCERIN (NITROSTAT) 0.4 MG SL tablet Place 0.4 mg under the tongue every 5 (five) minutes as needed for chest pain.     omeprazole (PRILOSEC) 40 MG capsule Take 1 capsule (40 mg total) by mouth daily. 90 capsule 3   ondansetron (ZOFRAN-ODT) 4 MG disintegrating tablet Take 4 mg by mouth every 8 (eight) hours as needed for nausea or vomiting.     OVER THE COUNTER MEDICATION CBD oil tincture 2.1 mg as needed up to three times daily for appetite.     Polyethyl  Glycol-Propyl Glycol (SYSTANE OP) Place 2 drops into both eyes 3 (three) times daily as needed (dry eyes).     potassium chloride SA (KLOR-CON M) 20 MEQ tablet Take 20 mEq by mouth 2 (two) times daily.     polyethylene glycol powder (GLYCOLAX/MIRALAX) 17 GM/SCOOP powder Take 8.5 g by mouth daily. (Patient not taking: Reported on 08/12/2022) 255 g 0    No results found for this or any previous visit (from the past 48 hour(s)). No results found.  Review of Systems  Blood pressure (!) 162/66, pulse 86, temperature 98 F (36.7 C), temperature source Oral, resp. rate 18, height 5' 3"  (1.6 m), weight 61.2 kg, SpO2 100 %. Physical Exam  GENERAL: The patient is AO x3, in no acute distress. HEENT: Head is normocephalic and atraumatic. EOMI are intact. Mouth is well hydrated and without lesions. NECK: Supple. No masses LUNGS: Clear to auscultation. No presence of rhonchi/wheezing/rales. Adequate chest expansion HEART: RRR, normal s1 and s2. ABDOMEN: Soft, nontender, no guarding, no peritoneal signs, and nondistended. BS +. No masses. EXTREMITIES: Without any cyanosis, clubbing, rash, lesions or edema. NEUROLOGIC: AOx3, no focal motor deficit. SKIN: no jaundice, no rashes  Assessment/Plan 81 y/o F with PMH atrial fibrillation and nonbleeding esophageal varices, cirrhosis c/b HCC, hyperlipidemia, history of stroke, GERD, diabetes, depression, aortic insufficiency, seizures, coming for follow-up of esophageal varices.  Will proceed with EGD.  Harvel Quale, MD 08/19/2022, 7:28 AM

## 2022-08-19 NOTE — Discharge Instructions (Signed)
You are being discharged to home.  Resume your previous diet.  Continue your present medications.  Will discuss possibility of TACE/MWA/Y90 intevention +/- chemotherapy for newly diagnosed [place] with IR and oncology. Restart Eliquis tonight.

## 2022-08-19 NOTE — Telephone Encounter (Signed)
[name] made aware to have patient take only the lactulose and that a referral to IR has been submitted.

## 2022-08-19 NOTE — Anesthesia Postprocedure Evaluation (Signed)
Anesthesia Post Note  Patient: [name]  Procedure(s) Performed: ESOPHAGOGASTRODUODENOSCOPY (EGD) WITH PROPOFOL  Patient location during evaluation: Endoscopy Anesthesia Type: General Level of consciousness: awake and alert Pain management: pain level controlled Vital Signs Assessment: post-procedure vital signs reviewed and stable Respiratory status: spontaneous breathing, nonlabored ventilation, respiratory function stable and patient connected to nasal cannula oxygen Cardiovascular status: blood pressure returned to baseline and stable Postop Assessment: no apparent nausea or vomiting Anesthetic complications: no   There were no known notable events for this encounter.   Last Vitals:  Vitals:   08/19/22 0746 08/19/22 0752  BP: 120/61 128/66  Pulse: 81 88  Resp: 15 15  Temp: 36.5 C   SpO2: 97% 97%    Last Pain:  Vitals:   08/19/22 0752  TempSrc:   PainSc: 0-No pain                 Trixie Rude

## 2022-08-19 NOTE — Telephone Encounter (Signed)
She should not take Miralax, only Lactulose Also please inform her that we will refer her to interventional radiology to evaluate treatment options of her liver cancer  Thanks

## 2022-08-20 ENCOUNTER — Ambulatory Visit (HOSPITAL_COMMUNITY)
Admission: RE | Admit: 2022-08-20 | Discharge: 2022-08-20 | Disposition: A | Discharge status: Home / Self Care | Payer: Medicare Other | Source: Ambulatory Visit | Attending: Cardiology | Admitting: Cardiology

## 2022-08-20 DIAGNOSIS — I350 Nonrheumatic aortic (valve) stenosis: Secondary | ICD-10-CM | POA: Diagnosis not present

## 2022-08-20 LAB — ECHOCARDIOGRAM COMPLETE
AR max vel: 0.98 cm2
AV Area VTI: 0.91 cm2
AV Area mean vel: 0.92 cm2
AV Mean grad: 24 mmHg
AV Peak grad: 33.7 mmHg
Ao pk vel: 2.9 m/s
Area-P 1/2: 3.31 cm2
MV M vel: 6.37 m/s
MV Peak grad: 162.3 mmHg
MV VTI: 1.24 cm2
P 1/2 time: 355 msec
S' Lateral: 2.3 cm

## 2022-08-20 NOTE — Progress Notes (Signed)
*  PRELIMINARY RESULTS* Echocardiogram 2D Echocardiogram has been performed.  Samuel Germany 08/20/2022, 11:43 AM

## 2022-08-21 ENCOUNTER — Encounter: Payer: Self-pay | Admitting: Hematology

## 2022-08-21 ENCOUNTER — Inpatient Hospital Stay: Payer: Medicare Other | Attending: Hematology | Admitting: Hematology

## 2022-08-21 VITALS — BP 136/53 | HR 64 | Temp 97.7°F | Resp 16 | Ht 63.0 in | Wt 130.7 lb

## 2022-08-21 DIAGNOSIS — Z888 Allergy status to other drugs, medicaments and biological substances status: Secondary | ICD-10-CM | POA: Insufficient documentation

## 2022-08-21 DIAGNOSIS — Z8673 Personal history of transient ischemic attack (TIA), and cerebral infarction without residual deficits: Secondary | ICD-10-CM | POA: Diagnosis not present

## 2022-08-21 DIAGNOSIS — K7460 Unspecified cirrhosis of liver: Secondary | ICD-10-CM | POA: Diagnosis not present

## 2022-08-21 DIAGNOSIS — K219 Gastro-esophageal reflux disease without esophagitis: Secondary | ICD-10-CM | POA: Insufficient documentation

## 2022-08-21 DIAGNOSIS — I2510 Atherosclerotic heart disease of native coronary artery without angina pectoris: Secondary | ICD-10-CM | POA: Insufficient documentation

## 2022-08-21 DIAGNOSIS — Z87440 Personal history of urinary (tract) infections: Secondary | ICD-10-CM | POA: Insufficient documentation

## 2022-08-21 DIAGNOSIS — Z79899 Other long term (current) drug therapy: Secondary | ICD-10-CM | POA: Insufficient documentation

## 2022-08-21 DIAGNOSIS — Z8249 Family history of ischemic heart disease and other diseases of the circulatory system: Secondary | ICD-10-CM | POA: Insufficient documentation

## 2022-08-21 DIAGNOSIS — K7581 Nonalcoholic steatohepatitis (NASH): Secondary | ICD-10-CM | POA: Insufficient documentation

## 2022-08-21 DIAGNOSIS — C220 Liver cell carcinoma: Secondary | ICD-10-CM | POA: Diagnosis not present

## 2022-08-21 DIAGNOSIS — Z833 Family history of diabetes mellitus: Secondary | ICD-10-CM | POA: Diagnosis not present

## 2022-08-21 DIAGNOSIS — Z9049 Acquired absence of other specified parts of digestive tract: Secondary | ICD-10-CM | POA: Insufficient documentation

## 2022-08-21 DIAGNOSIS — I4819 Other persistent atrial fibrillation: Secondary | ICD-10-CM | POA: Insufficient documentation

## 2022-08-21 DIAGNOSIS — Z7901 Long term (current) use of anticoagulants: Secondary | ICD-10-CM | POA: Diagnosis not present

## 2022-08-21 DIAGNOSIS — R531 Weakness: Secondary | ICD-10-CM | POA: Insufficient documentation

## 2022-08-21 DIAGNOSIS — K828 Other specified diseases of gallbladder: Secondary | ICD-10-CM | POA: Insufficient documentation

## 2022-08-21 DIAGNOSIS — E7800 Pure hypercholesterolemia, unspecified: Secondary | ICD-10-CM | POA: Insufficient documentation

## 2022-08-21 NOTE — Patient Instructions (Addendum)
[place] at [facility] Discharge Instructions   You were seen and examined today by Dr. Delton Coombes. He is a Futures trader (cancer doctor) who is seeing you today regarding the liver lesion that was biopsied and found to be cancer - well differentiated hepatocellular carcinoma (liver cancer).   There is no need for any other testing at this time since the recent scan does not show any spread of cancer anywhere else in your body and the appropriate blood tests were done.   Follow up with interventional radiology as scheduled.   We will see you back in about 4 months after you have completed your treatments.    Thank you for choosing [place] at [facility] to provide your oncology and hematology care.  To afford each patient quality time with our provider, please arrive at least 15 minutes before your scheduled appointment time.   If you have a lab appointment with the [facility] please come in thru the Main Entrance and check in at the main information desk.  You need to re-schedule your appointment should you arrive 10 or more minutes late.  We strive to give you quality time with our providers, and arriving late affects you and other patients whose appointments are after yours.  Also, if you no show three or more times for appointments you may be dismissed from the clinic at the providers discretion.     Again, thank you for choosing [facility]  Our hope is that these requests will decrease the amount of time that you wait before being seen by our physicians.       _____________________________________________________________  Should you have questions after your visit to [facility], please contact our office at [phone] and follow the prompts.  Our office hours are 8:00 a.m. and 4:30 p.m. Monday - Friday.  Please note that voicemails left after 4:00 p.m. may not be returned until the following  business day.  We are closed weekends and major holidays.  You do have access to a nurse 24-7, just call the main number to the clinic [phone] and do not press any options, hold on the line and a nurse will answer the phone.    For prescription refill requests, have your pharmacy contact our office and allow 72 hours.    Due to Covid, you will need to wear a mask upon entering the hospital. If you do not have a mask, a mask will be given to you at the Main Entrance upon arrival. For doctor visits, patients may have 1 support person age 39 or older with them. For treatment visits, patients can not have anyone with them due to social distancing guidelines and our immunocompromised population.

## 2022-08-22 NOTE — Progress Notes (Signed)
[facility] NOTE  Patient Care Team: Redmond School, MD as PCP - General (Internal Medicine) Harl Bowie, Alphonse Guild, MD as PCP - Cardiology (Cardiology) Caprice Beaver, DPM as Consulting Physician (Podiatry) Laural Golden, Mechele Dawley, MD as Consulting Physician (Gastroenterology) Leta Baptist, MD as Consulting Physician (Otolaryngology) Phillips Odor, MD as Consulting Physician (Neurology) Rod Can, MD as Consulting Physician (Orthopedic Surgery) Sanjuana Kava, MD as Consulting Physician (Orthopedic Surgery) Carole Civil, MD as Consulting Physician (Orthopedic Surgery) Rutherford Guys, MD as Consulting Physician (Ophthalmology) Elam Dutch, MD (Inactive) as Consulting Physician (Vascular Surgery)  CHIEF COMPLAINTS/PURPOSE OF CONSULTATION:  Well-differentiated hepatocellular carcinoma  HISTORY OF PRESENTING ILLNESS:  [name] 81 y.o. female is seen in consultation today at the request of Dr. Jenetta Downer for further workup and management of newly diagnosed hepatocellular carcinoma.  She has a history of NASH cirrhosis.  CT CAP in the ED in August showed 4.5 cm hyperenhancing lesion within the left lobe of liver.  This was followed with an MRI which was equivocal for [facility].  And she underwent biopsy which confirmed well-differentiated HCC.  She had history of multiple CVAs with mild left-sided weakness and is seen today in the wheelchair.  She lost about 40 pounds in the last 4 years.  No acute weight loss in the last 6 months.  She denies any fevers or night sweats.  MEDICAL HISTORY:  Past Medical History:  Diagnosis Date   Anemia    Anxiety    Aortic insufficiency    Moderate   Atrial fibrillation (HCC)    Back pain    Carotid stenosis, bilateral    Cirrhosis (HCC)    Closed left femoral fracture (HCC) 04/21/2017   Coronary artery disease    Stent x 2 RCA 1995, cardiac catheterization 09/2016 showing only mild atherosclerosis   DDD (degenerative  disc disease), lumbar    Depression    Diabetes mellitus without complication (HCC)    Essential hypertension    Falls    GERD (gastroesophageal reflux disease)    Hiatal hernia    History of stroke    Hypercholesteremia    IBS (irritable bowel syndrome)    Non-alcoholic fatty liver disease    OSA (obstructive sleep apnea)    no CPAP   Pericardial effusion    a. small by echo 2018.   PSVT (paroxysmal supraventricular tachycardia)    Recurrent UTI    Seizures ([place]) 2018   Stroke ([place]) 07/2019   several strokes within the last 10 years   Swallowing difficulty    Varices, esophageal ([place])     SURGICAL HISTORY: Past Surgical History:  Procedure Laterality Date   [place]  [phone]   Stent to the proximal RCA after MI    CARDIAC CATHETERIZATION N/A 09/26/2016   Procedure: Left Heart Cath and Coronary Angiography;  Surgeon: Leonie Man, MD;  Location: [place] [facility];  Service: Cardiovascular;  Laterality: N/A;   CATARACT EXTRACTION W/PHACO Right 07/04/2014   Procedure: CATARACT EXTRACTION PHACO AND INTRAOCULAR LENS PLACEMENT (IOC);  Surgeon: Elta Guadeloupe T. Gershon Crane, MD;  Location: [facility];  Service: Ophthalmology;  Laterality: Right;  CDE:13.13   COLONOSCOPY     DILATION AND CURETTAGE OF UTERUS     x2   Epi Retinal Membrane Peel Left    ERCP     ESOPHAGEAL BANDING N/A 04/01/2013   Procedure: ESOPHAGEAL BANDING;  Surgeon: Rogene Houston, MD;  [facility];  Service: Endoscopy;  Laterality: N/A;   ESOPHAGEAL BANDING N/A 05/24/2013   Procedure: ESOPHAGEAL BANDING;  Surgeon: Rogene Houston, MD;  [facility];  Service: Endoscopy;  Laterality: N/A;   ESOPHAGEAL BANDING N/A 06/21/2014   Procedure: ESOPHAGEAL BANDING;  Surgeon: Rogene Houston, MD;  [facility];  Service: Endoscopy;  Laterality: N/A;   ESOPHAGEAL BANDING  06/15/2020   Procedure: ESOPHAGEAL BANDING;   Surgeon: Harvel Quale, MD;  Location[facility];  Service: Gastroenterology;;   ESOPHAGOGASTRODUODENOSCOPY N/A 04/01/2013   Procedure: ESOPHAGOGASTRODUODENOSCOPY (EGD);  Surgeon: Rogene Houston, MD;  [facility];  Service: Endoscopy;  Laterality: N/A;  230-rescheduled to 8:30am Ann notified pt   ESOPHAGOGASTRODUODENOSCOPY N/A 05/24/2013   Procedure: ESOPHAGOGASTRODUODENOSCOPY (EGD);  Surgeon: Rogene Houston, MD;  [facility];  Service: Endoscopy;  Laterality: N/A;  730   ESOPHAGOGASTRODUODENOSCOPY N/A 06/21/2014   Procedure: ESOPHAGOGASTRODUODENOSCOPY (EGD);  Surgeon: Rogene Houston, MD;  [facility];  Service: Endoscopy;  Laterality: N/A;  930-rescheduled 10/14 @ 1200 Ann to notify pt   ESOPHAGOGASTRODUODENOSCOPY (EGD) WITH PROPOFOL N/A 06/15/2020   Procedure: ESOPHAGOGASTRODUODENOSCOPY (EGD) WITH PROPOFOL;  Surgeon: Harvel Quale, MD;  [facility];  Service: Gastroenterology;  Laterality: N/A;  230   ESOPHAGOGASTRODUODENOSCOPY (EGD) WITH PROPOFOL N/A 07/27/2020   Procedure: ESOPHAGOGASTRODUODENOSCOPY (EGD) WITH PROPOFOL;  Surgeon: Harvel Quale, MD;  [facility];  Service: Gastroenterology;  Laterality: N/A;  1245   EYE SURGERY  08   cataract surgery of the left eye   FEMUR IM NAIL Left 03/02/2017   Procedure: INTRAMEDULLARY (IM) NAIL FEMORAL;  Surgeon: Rod Can, MD;  Location: [place];  Service: Orthopedics;  Laterality: Left;   HARDWARE REMOVAL Right 01/17/2013   Procedure: REMOVAL OF HARDWARE AND EXCISION ULNAR STYLOID RIGHT WRIST;  Surgeon: Tennis Must, MD;  Location: [place];  Service: Orthopedics;  Laterality: Right;   implanted heart monitor  02/2020   to monitor Afib   MYRINGOTOMY  2012   both ears   ORIF FEMUR FRACTURE Left 04/23/2017   Procedure: OPEN REDUCTION INTERNAL FIXATION (ORIF) DISTAL FEMUR FRACTURE (FRACTURE AROUND FEMORAL NAIL);  Surgeon: Altamese , MD;  Location: [place];  Service: Orthopedics;  Laterality: Left;   TONSILLECTOMY     TUBAL LIGATION     VAGINAL HYSTERECTOMY  1972   WRIST SURGERY     rt wrist hardwear removal    SOCIAL HISTORY: Social History   Socioeconomic History   Marital status: Widowed    Spouse name: Not on file   Number of children: 2   Years of education: 2 years college   Highest education level: Some college, no degree  Occupational History    Employer: RETIRED  Tobacco Use   Smoking status: Some Days    Packs/day: 0.25    Years: 50.00    Total pack years: 12.50    Types: Cigarettes    Passive exposure: Current   Smokeless tobacco: Never   Tobacco comments:    12/23/21 1 pack Q two weeks.  Vaping Use   Vaping Use: Never used  Substance and Sexual Activity   Alcohol use: No    Alcohol/week: 0.0 standard drinks of alcohol   Drug use: No   Sexual activity: Not Currently    Birth control/protection: Surgical  Other Topics Concern   Not on file  Social History Narrative  12/23/21  Lives with 3 grandchildren, sone and dgtr-in -law are across the st   Social Determinants of Health   Financial Resource Strain: Low Risk  (07/15/2022)   Overall Financial Resource Strain (CARDIA)    Difficulty of Paying Living Expenses: Not hard at all  Food Insecurity: No Food Insecurity (09/26/2021)   Hunger Vital Sign    Worried About Running Out of Food in the Last Year: Never true    Ran Out of Food in the Last Year: Never true  Transportation Needs: No Transportation Needs (07/15/2022)   PRAPARE - Hydrologist (Medical): No    Lack of Transportation (Non-Medical): No  Physical Activity: Inactive (03/16/2019)   Exercise Vital Sign    Days of Exercise per Week: 0 days    Minutes of Exercise per Session: 0 min  Stress: No Stress Concern Present (03/16/2019)   [place]    Feeling of Stress : Not at all   Social Connections: Moderately Isolated (03/16/2019)   Social Connection and Isolation Panel [NHANES]    Frequency of Communication with Friends and Family: More than three times a week    Frequency of Social Gatherings with Friends and Family: Never    Attends Religious Services: 1 to 4 times per year    Active Member of [employer] or Organizations: No    Attends Archivist Meetings: Never    Marital Status: Widowed  Intimate Partner Violence: Not At Risk (03/16/2019)   Humiliation, Afraid, Rape, and Kick questionnaire    Fear of Current or Ex-Partner: No    Emotionally Abused: No    Physically Abused: No    Sexually Abused: No    FAMILY HISTORY: Family History  Problem Relation Age of Onset   Diabetes Mother    Heart failure Father    Heart failure Maternal Aunt     ALLERGIES:  is allergic to tape.  MEDICATIONS:  Current Outpatient Medications  Medication Sig Dispense Refill   acetaminophen (TYLENOL) 500 MG tablet Take 1,000 mg by mouth at bedtime.     apixaban (ELIQUIS) 5 MG TABS tablet TAKE (1) TABLET BY MOUTH TWICE DAILY. 60 tablet 5   atorvastatin (LIPITOR) 80 MG tablet Take 1 tablet (80 mg total) by mouth daily. (Patient taking differently: Take 80 mg by mouth at bedtime.) 30 tablet 1   azelastine (ASTELIN) 0.1 % nasal spray Place 1 spray into both nostrils at bedtime.     CALCIUM CITRATE PO Take 200 mg by mouth daily.     carvedilol (COREG) 6.25 MG tablet TAKE (1) TABLET BY MOUTH TWICE DAILY WITH A MEAL. 180 tablet 0   Cholecalciferol (VITAMIN D3) 50 MCG (2000 UT) TABS Take 2,000 Units by mouth daily.     Coenzyme Q10 (CO Q-10) 100 MG CAPS Take 100 mg by mouth in the morning.     diltiazem (CARDIZEM CD) 180 MG 24 hr capsule Take 180 mg by mouth daily.     doxepin (SINEQUAN) 25 MG capsule Take 25 mg by mouth at bedtime.     doxylamine, Sleep, (UNISOM) 25 MG tablet Take 1 tablet (25 mg total) by mouth at bedtime as needed. (Patient taking differently: Take 25 mg by  mouth at bedtime.)     furosemide (LASIX) 40 MG tablet Take 0.5 tablets (20 mg total) by mouth daily. TAKE (1) TABLET BY MOUTH ONCE DAILY. MAY TAKE ADDITIONAL HALF IF NEEDED FOR SEVERE SWELLING. Strength: 20  mg (Patient taking differently: Take 20-40 mg by mouth See admin instructions. Alternate taking 20 mg one day and 40 mg the next) 45 tablet 0   lactulose (CHRONULAC) 10 GM/15ML solution Take 30 mLs (20 g total) by mouth 2 (two) times daily. 5400 mL 1   loperamide (IMODIUM) 2 MG capsule Take 2 mg by mouth as needed for diarrhea or loose stools.     Multiple Vitamin (MULTIVITAMIN WITH MINERALS) TABS tablet Take 1 tablet by mouth daily.     omeprazole (PRILOSEC) 40 MG capsule Take 1 capsule (40 mg total) by mouth daily. 90 capsule 3   OVER THE COUNTER MEDICATION CBD oil tincture 2.1 mg as needed up to three times daily for appetite.     potassium chloride SA (KLOR-CON M) 20 MEQ tablet Take 20 mEq by mouth 2 (two) times daily.     nitroGLYCERIN (NITROSTAT) 0.4 MG SL tablet Place 0.4 mg under the tongue every 5 (five) minutes as needed for chest pain. (Patient not taking: Reported on 08/21/2022)     ondansetron (ZOFRAN-ODT) 4 MG disintegrating tablet Take 4 mg by mouth every 8 (eight) hours as needed for nausea or vomiting. (Patient not taking: Reported on 08/21/2022)     No current facility-administered medications for this visit.    REVIEW OF SYSTEMS:   Constitutional: Denies fevers, chills or abnormal night sweats Eyes: Denies blurriness of vision, double vision or watery eyes Ears, nose, mouth, throat, and face: Denies mucositis or sore throat Respiratory: Positive for dry cough. Cardiovascular: Denies palpitation, chest discomfort or lower extremity swelling Gastrointestinal: Positive for constipation, nausea. Skin: Denies abnormal skin rashes Lymphatics: Denies new lymphadenopathy or easy bruising Neurological:Denies numbness, tingling or new weaknesses Behavioral/Psych: Mood is stable,  no new changes  All other systems were reviewed with the patient and are negative.  PHYSICAL EXAMINATION: ECOG PERFORMANCE STATUS: 1 - Symptomatic but completely ambulatory  Vitals:   08/21/22 1357  BP: (!) 136/53  Pulse: 64  Resp: 16  Temp: 97.7 F (36.5 C)  SpO2: 100%   Filed Weights   08/21/22 1357  Weight: 130 lb 11.2 oz (59.3 kg)    GENERAL:alert, no distress and comfortable SKIN: skin color, texture, turgor are normal, no rashes or significant lesions EYES: normal, conjunctiva are pink and non-injected, sclera clear OROPHARYNX:no exudate, no erythema and lips, buccal mucosa, and tongue normal  NECK: supple, thyroid normal size, non-tender, without nodularity LYMPH:  no palpable lymphadenopathy in the cervical, axillary or inguinal LUNGS: clear to auscultation and percussion with normal breathing effort HEART: regular rate & rhythm and no murmurs and no lower extremity edema ABDOMEN:abdomen soft, non-tender and normal bowel sounds Musculoskeletal:no cyanosis of digits and no clubbing  PSYCH: alert & oriented x 3 with fluent speech NEURO: no focal motor/sensory deficits  LABORATORY DATA:  I have reviewed the data as listed Lab Results  Component Value Date   WBC 6.1 08/14/2022   HGB 13.0 08/14/2022   HCT 39.2 08/14/2022   MCV 95.4 08/14/2022   PLT 143 (L) 08/14/2022     Chemistry      Component Value Date/Time   NA 137 08/14/2022 1028   K 3.7 08/14/2022 1028   CL 107 08/14/2022 1028   CO2 23 08/14/2022 1028   BUN 15 08/14/2022 1028   CREATININE 0.98 08/14/2022 1028   CREATININE 0.88 06/04/2020 1148      Component Value Date/Time   CALCIUM 9.2 08/14/2022 1028   CALCIUM 8.4 (L) 04/22/2017 0514   ALKPHOS  153 (H) 08/14/2022 1028   AST 53 (H) 08/14/2022 1028   ALT 36 08/14/2022 1028   BILITOT 1.5 (H) 08/14/2022 1028   BILITOT 0.5 03/24/2018 1134       RADIOGRAPHIC STUDIES: I have personally reviewed the radiological images as listed and agreed with  the findings in the report. ECHOCARDIOGRAM COMPLETE  Result Date: 08/20/2022    ECHOCARDIOGRAM REPORT   Patient Name:   [name] Dall Date of Exam: 08/20/2022 Medical Rec #:  [id number]      Height:       63.0 in Accession #:    [id number]     Weight:       134.9 lb Date of Birth:  12/28/1940     BSA:          1.636 m Patient Age:    75 years       BP:           157/81 mmHg Patient Gender: F              HR:           87 bpm. Exam Location:  [facility] Procedure: 2D Echo, Cardiac Doppler and Color Doppler Indications:    I35.0 (ICD-10-CM) - Aortic valve stenosis, etiology of cardiac                 valve disease unspecified  History:        Patient has prior history of Echocardiogram examinations, most                 recent 02/20/2022. CAD, Stroke, Mitral Valve Disease and Aortic                 Valve Disease, Arrythmias:Atrial Fibrillation; Risk                 Factors:Hypertension, Diabetes and Current Smoker.  Sonographer:    Alvino Chapel RCS Referring Phys: [id number] [place]  1. Left ventricular ejection fraction, by estimation, is >75%. The left ventricle has hyperdynamic function. The left ventricle has no regional wall motion abnormalities. There is mild concentric left ventricular hypertrophy. Left ventricular diastolic parameters are indeterminate.  2. Right ventricular systolic function is normal. The right ventricular size is normal. There is mildly elevated pulmonary artery systolic pressure. The estimated right ventricular systolic pressure is 03.4 mmHg.  3. Left atrial size was severely dilated.  4. Right atrial size was mild to moderately dilated.  5. The mitral valve is degenerative, Carpentier class IIIa. Moderate mitral valve regurgitation. The mean mitral valve gradient is 3.0 mmHg. Severe mitral annular calcification.  6. The aortic valve has an indeterminant number of cusps, possibly functionally bicuspid. There is severe calcifcation of the aortic valve. Aortic valve  regurgitation is moderate. Severe aortic valve stenosis, low flow/low gradient with preserved LVEF. Aortic regurgitation PHT measures 355 msec. Aortic valve area, by VTI measures 0.91 cm. Aortic valve mean gradient measures 24.0 mmHg. Dimentionless index 0.29.  7. The inferior vena cava is dilated in size with >50% respiratory variability, suggesting right atrial pressure of 8 mmHg. Comparison(s): Prior images reviewed side by side. LVEF vigorous with evidence of moderate mitral regurgitation in the setting of degenerative mitral valve with severe annular calcification as well as severe aortic stenosis (low flow/low gradient with preserved LVEF). Moderate aortic regurgitation. FINDINGS  Left Ventricle: Left ventricular ejection fraction, by estimation, is >75%. The left ventricle has hyperdynamic function. The left ventricle has no regional wall  motion abnormalities. The left ventricular internal cavity size was normal in size. There is mild concentric left ventricular hypertrophy. Left ventricular diastolic parameters are indeterminate. Right Ventricle: The right ventricular size is normal. No increase in right ventricular wall thickness. Right ventricular systolic function is normal. There is mildly elevated pulmonary artery systolic pressure. The tricuspid regurgitant velocity is 2.90  m/s, and with an assumed right atrial pressure of 8 mmHg, the estimated right ventricular systolic pressure is 21.3 mmHg. Left Atrium: Left atrial size was severely dilated. Right Atrium: Right atrial size was mild to moderately dilated. Pericardium: There is no evidence of pericardial effusion. Mitral Valve: The mitral valve is degenerative in appearance. Severe mitral annular calcification. Moderate mitral valve regurgitation, with centrally-directed jet. MV peak gradient, 13.5 mmHg. The mean mitral valve gradient is 3.0 mmHg. Tricuspid Valve: The tricuspid valve is grossly normal. Tricuspid valve regurgitation is mild. Aortic  Valve: The aortic valve has an indeterminant number of cusps. There is severe calcifcation of the aortic valve. There is mild aortic valve annular calcification. Aortic valve regurgitation is moderate. Aortic regurgitation PHT measures 355 msec. Severe aortic stenosis is present. Aortic valve mean gradient measures 24.0 mmHg. Aortic valve peak gradient measures 33.7 mmHg. Aortic valve area, by VTI measures 0.91 cm. Pulmonic Valve: The pulmonic valve was grossly normal. Pulmonic valve regurgitation is trivial. Aorta: The aortic root is normal in size and structure. Venous: The inferior vena cava is dilated in size with greater than 50% respiratory variability, suggesting right atrial pressure of 8 mmHg. IAS/Shunts: No atrial level shunt detected by color flow Doppler.  LEFT VENTRICLE PLAX 2D LVIDd:         4.45 cm LVIDs:         2.30 cm LV PW:         1.05 cm LV IVS:        1.05 cm LVOT diam:     2.00 cm LV SV:         60 LV SV Index:   37 LVOT Area:     3.14 cm  RIGHT VENTRICLE RV S prime:     11.30 cm/s TAPSE (M-mode): 1.8 cm LEFT ATRIUM              Index        RIGHT ATRIUM           Index LA diam:        3.65 cm  2.23 cm/m   RA Area:     21.30 cm LA Vol (A2C):   117.0 ml 71.53 ml/m  RA Volume:   67.40 ml  41.20 ml/m LA Vol (A4C):   120.0 ml 73.36 ml/m LA Biplane Vol: 123.0 ml 75.19 ml/m  AORTIC VALVE AV Area (Vmax):    0.98 cm AV Area (Vmean):   0.92 cm AV Area (VTI):     0.91 cm AV Vmax:           290.20 cm/s AV Vmean:          207.200 cm/s AV VTI:            0.665 m AV Peak Grad:      33.7 mmHg AV Mean Grad:      24.0 mmHg LVOT Vmax:         90.80 cm/s LVOT Vmean:        60.500 cm/s LVOT VTI:          0.192 m LVOT/AV VTI ratio: 0.29 AI PHT:  355 msec  AORTA Ao Root diam: 3.40 cm MITRAL VALVE                TRICUSPID VALVE MV Area (PHT): 3.31 cm     TR Peak grad:   33.6 mmHg MV Area VTI:   1.24 cm     TR Vmax:        290.00 cm/s MV Peak grad:  13.5 mmHg MV Mean grad:  3.0 mmHg     SHUNTS  MV Vmax:       1.84 m/s     Systemic VTI:  0.19 m MV Vmean:      72.2 cm/s    Systemic Diam: 2.00 cm MV Decel Time: 229 msec MR Peak grad: 162.3 mmHg MR Mean grad: 110.0 mmHg MR Vmax:      637.00 cm/s MR Vmean:     495.0 cm/s MV E velocity: 186.00 cm/s Rozann Lesches MD Electronically signed by Rozann Lesches MD Signature Date/Time: 08/20/2022/12:11:14 PM    Final    US BIOPSY (LIVER)  Result Date: 08/15/2022 CLINICAL DATA:  Liver mass. Cirrhosis with LR-M lesion on recent MR abdomen EXAM: Procedures: 1. ULTRASOUND ABDOMEN LIMITED RIGHT UPPER QUADRANT 2. ULTRASOUND GUIDED LIVER LESION BIOPSY COMPARISON:  MRI abdomen, 07/24/2022. MEDICATIONS: None ANESTHESIA/SEDATION: Moderate (conscious) sedation was employed during this procedure. A total of Versed 1 mg and Fentanyl 25 mcg was administered intravenously. Moderate Sedation Time: 24 minutes. The patient's level of consciousness and vital signs were monitored continuously by radiology nursing throughout the procedure under my direct supervision. COMPLICATIONS: None immediate. FINDINGS: RIGHT UPPER QUADRANT ULTRASOUND; Liver: Increased hepatic parenchymal echogenicity with coarse echotexture. Nodular contour. Portal vein is patent on color Doppler imaging with normal direction of blood flow towards the liver. Other: LEFT hepatic lobe mass, measuring up to 5.0 cm. FLUID No ascites. PROCEDURE: LIVER MASS BIOPSY WITH CONTRAST; Informed written consent was obtained from the patient and/or patient's representative after a discussion of the risks, benefits and alternatives to treatment. The patient understands and consents the procedure. A timeout was performed prior to the initiation of the procedure. Ultrasound scanning was performed of the epigastrium and upper abdominal quadrant demonstrates LEFT hepatic lobe mass, as above. The LEFT hepatic lobe mass was selected for biopsy and the procedure was planned. The right upper abdominal quadrant was prepped and draped in  the usual sterile fashion. The overlying soft tissues were anesthetized with 1% lidocaine with epinephrine. A 17 gauge, 6.8 cm co-axial needle was advanced into a peripheral aspect of the lesion. This was followed by 4 core biopsies with an 18 gauge core device under direct ultrasound guidance. The coaxial needle tract was embolized with a small amount of Gel-Foam slurry and superficial hemostasis was obtained with manual compression. Post procedural scanning was negative for definitive area of hemorrhage or additional complication. A dressing was placed. The patient tolerated the procedure well without immediate post procedural complication. FINDINGS: 1. Cirrhotic morphology of liver. 2. 5.0 cm LEFT hepatic lobe mass, similar in appearance and location to lesion seen on comparison MR abdomen. IMPRESSION: Successful ultrasound-guided LEFT liver mass biopsy. Michaelle Birks, MD [facility] Radiology Electronically Signed   By: Michaelle Birks M.D.   On: 08/15/2022 16:31   US Abdomen Limited  Result Date: 08/15/2022 CLINICAL DATA:  Liver mass. Cirrhosis with LR-M lesion on recent MR abdomen EXAM: Procedures: 1. ULTRASOUND ABDOMEN LIMITED RIGHT UPPER QUADRANT 2. ULTRASOUND GUIDED LIVER LESION BIOPSY COMPARISON:  MRI abdomen, 07/24/2022. MEDICATIONS: None  ANESTHESIA/SEDATION: Moderate (conscious) sedation was employed during this procedure. A total of Versed 1 mg and Fentanyl 25 mcg was administered intravenously. Moderate Sedation Time: 24 minutes. The patient's level of consciousness and vital signs were monitored continuously by radiology nursing throughout the procedure under my direct supervision. COMPLICATIONS: None immediate. FINDINGS: RIGHT UPPER QUADRANT ULTRASOUND; Liver: Increased hepatic parenchymal echogenicity with coarse echotexture. Nodular contour. Portal vein is patent on color Doppler imaging with normal direction of blood flow towards the liver. Other:  LEFT hepatic lobe mass, measuring up to 5.0 cm. FLUID No ascites. PROCEDURE: LIVER MASS BIOPSY WITH CONTRAST; Informed written consent was obtained from the patient and/or patient's representative after a discussion of the risks, benefits and alternatives to treatment. The patient understands and consents the procedure. A timeout was performed prior to the initiation of the procedure. Ultrasound scanning was performed of the epigastrium and upper abdominal quadrant demonstrates LEFT hepatic lobe mass, as above. The LEFT hepatic lobe mass was selected for biopsy and the procedure was planned. The right upper abdominal quadrant was prepped and draped in the usual sterile fashion. The overlying soft tissues were anesthetized with 1% lidocaine with epinephrine. A 17 gauge, 6.8 cm co-axial needle was advanced into a peripheral aspect of the lesion. This was followed by 4 core biopsies with an 18 gauge core device under direct ultrasound guidance. The coaxial needle tract was embolized with a small amount of Gel-Foam slurry and superficial hemostasis was obtained with manual compression. Post procedural scanning was negative for definitive area of hemorrhage or additional complication. A dressing was placed. The patient tolerated the procedure well without immediate post procedural complication. FINDINGS: 1. Cirrhotic morphology of liver. 2. 5.0 cm LEFT hepatic lobe mass, similar in appearance and location to lesion seen on comparison MR abdomen. IMPRESSION: Successful ultrasound-guided LEFT liver mass biopsy. Michaelle Birks, MD [facility] Radiology Electronically Signed   By: Michaelle Birks M.D.   On: 08/15/2022 16:31   CUP PACEART REMOTE DEVICE CHECK  Result Date: 07/29/2022 ILR summary report received. Battery status OK. Normal device function. No new symptom, tachy, brady, episodes. Persistent AF,  controlled rates, burden 98%, Eliquis.  Presenting rhythm AF. 2 pause  events, each 3sec. 1 undersensing, other event occurred 11/18 @ 10:12, no symptom activaition.   Monthly summary reports and ROV/PRN LA  MR LIVER W WO CONTRAST  Result Date: 07/25/2022 CLINICAL DATA:  Cirrhosis, abnormal CT imaging in a 81 year old female. EXAM: MRI ABDOMEN WITHOUT AND WITH CONTRAST TECHNIQUE: Multiplanar multisequence MR imaging of the abdomen was performed both before and after the administration of intravenous contrast. CONTRAST:  72m GADAVIST GADOBUTROL 1 MMOL/ML IV SOLN COMPARISON:  CT of the chest, abdomen and pelvis of April 12, 2022. FINDINGS: Lower chest: Moderate RIGHT and small LEFT-sided effusions. These are larger when compared to prior CT imaging. Heart size is enlarged. Hepatobiliary: Liver displays a cirrhotic morphology. The portal vein is patent. Sludge layers in the gallbladder and there is no biliary duct distension. Observation 1: A 4.8 x 2.5 cm observation in segment II/III (series 4, image 13, with positive arterial phase hyper-enhancement (APHE) question of mild rim type enhancement, positive washout appearance, no capsule appearance, and new from more remote imaging from 2019. Ancillary features include: Restricted diffusion. LR - M. Findings suggest the possibility of HBennetbut the question of rim type enhancement and T2 signal could be seen in the setting of cholangiocarcinoma. AFP and other tumor marker correlation may be helpful with biopsy  as warranted. No macrovascular invasion. No additional lesions in the liver. Exam is compromised by respiratory motion on post-contrast imaging. Pancreas: Mild atrophy of the pancreas without ductal dilation, inflammation or visible lesion. Spleen:  Normal size and contour. Adrenals/Urinary Tract: Adrenal glands are normal. Symmetric renal enhancement without hydronephrosis or suspicious renal lesion. Bosniak category I and II cysts are present in the bilateral kidneys for which no additional dedicated follow-up imaging is  recommended. Stomach/Bowel: No acute gastrointestinal findings. Limited assessment of gastrointestinal structures on this abdominal MRI performed for liver evaluation. Vascular/Lymphatic: Numerous upper abdominal venous collaterals including large splenorenal shunt and perigastric varices. No adenopathy in the abdomen. Other:  None. Musculoskeletal: No suspicious bone lesions identified. IMPRESSION: 1. LR-M lesion in the posterior LEFT hepatic lobe. Designated as such based on areas of low T2 signal centrally (a feature that can be seen in fibrous tumors) and potential subtle rim type enhancement. Cholangiocarcinoma in addition to mixed tumor or [place] all remain in the differential and should be correlated with tumor markers and with biopsy as warranted. 2. No additional lesions in the liver though study is limited by motion. 3. Advanced cirrhosis with signs of portal hypertension. 4. Enlarging bilateral pleural effusions RIGHT greater than LEFT. Electronically Signed   By: Zetta Bills M.D.   On: 07/25/2022 10:23    ASSESSMENT:  1.  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  Lost 40 pounds in the last 4 years but did not lose any weight in the last 6 months. - CT CAP (04/12/2022): Cirrhosis with 4.5 cm hyperenhancing mass in the left lobe of liver.  Portal hypertension.  Extensive varices and splenomegaly. - MRI liver (07/24/2022): 4.8 x 2.5 cm segment 2/3 mass with arterial phase hyperenhancement.  Differential includes cholangiocarcinoma/mixed tumor/HCC.  No other liver lesions. - US guided left liver lobe mass biopsy on 08/15/2022 - Pathology: Well-differentiated HCC - AFP: 203 (07/01/2022), CA 19-9: 16  2.  Social/family history: - She lives at home with her granddaughter.  Son lives across the street.  She worked as a Art therapist prior to retirement.  Quit smoking 2  months ago.  Smokes 1 pack/week for the last 52 years. - No family history of malignancies.   PLAN:  1.  Stage II (T2 N0 M0) well-differentiated HCC: - We discussed local treatment options as she does not have systemic disease.  She is not a candidate for transplant based on her age. - She is scheduled to discuss Y90 radiation segmentectomy with Dr. Serafina Royals on 09/10/2021. - Recommend follow-up in 3 months after the procedure with imaging.  Will also check AFP levels.   No orders of the defined types were placed in this encounter.   All questions were answered. The patient knows to call the clinic with any problems, questions or concerns.     Derek Jack, MD 08/22/2022 4:40 PM

## 2022-08-26 ENCOUNTER — Encounter (HOSPITAL_COMMUNITY): Payer: Self-pay | Admitting: Gastroenterology

## 2022-09-02 ENCOUNTER — Ambulatory Visit (INDEPENDENT_AMBULATORY_CARE_PROVIDER_SITE_OTHER): Payer: Medicare Other

## 2022-09-02 ENCOUNTER — Other Ambulatory Visit: Payer: Self-pay | Admitting: *Deleted

## 2022-09-02 DIAGNOSIS — I639 Cerebral infarction, unspecified: Secondary | ICD-10-CM | POA: Diagnosis not present

## 2022-09-02 DIAGNOSIS — C220 Liver cell carcinoma: Secondary | ICD-10-CM

## 2022-09-02 LAB — CUP PACEART REMOTE DEVICE CHECK
Date Time Interrogation Session: 20231225231525
Implantable Pulse Generator Implant Date: 20210602

## 2022-09-04 NOTE — Progress Notes (Signed)
Carelink Summary Report / Loop Recorder 

## 2022-09-10 ENCOUNTER — Ambulatory Visit
Admission: RE | Admit: 2022-09-10 | Discharge: 2022-09-10 | Disposition: A | Discharge status: Home / Self Care | Payer: Medicare Other | Source: Ambulatory Visit | Attending: Gastroenterology | Admitting: Gastroenterology

## 2022-09-10 ENCOUNTER — Other Ambulatory Visit: Payer: Medicare Other

## 2022-09-10 DIAGNOSIS — C220 Liver cell carcinoma: Secondary | ICD-10-CM | POA: Diagnosis not present

## 2022-09-10 HISTORY — PX: IR RADIOLOGIST EVAL & MGMT: IMG5224

## 2022-09-10 NOTE — Consult Note (Signed)
Chief Complaint: Patient was seen in consultation today for hepatocellular carcinoma  Referring Physician(s): Maylon Peppers, MD  History of Present Illness: [name] is a 82 y.o. female with history of recent incidentally discovered and now biopsy proven left lobe unifocal hepatocellular carcinoma.  She presents today via virtual telephone visit accompanied by her grand daughter, Mylan Lengyel [phone]).    Ms. Manske was admitted to the hospital in August 2023 for sepsis/UTI at which time a CT was performed which demonstrated cirrhosis and a concerning 4.5 cm left lobe liver mass. This prompted further evaluation with MRI which demonstrated a 4.8 cm segment II LR-M mass.  Ultrasound guided biopsy was performed on 08/15/22 which confirmed hepatocellular carcinoma.    She has been doing well overall.  No abdominal pain, nausea, vomiting, fevers, chills, chest pain, shortness of breath, jaundice.  She has intermittently poor appetite.      Past Medical History:  Diagnosis Date   Anemia    Anxiety    Aortic insufficiency    Moderate   Atrial fibrillation (HCC)    Back pain    Carotid stenosis, bilateral    Cirrhosis ([place])    Closed left femoral fracture (HCC) 04/21/2017   Coronary artery disease    Stent x 2 RCA 1995, cardiac catheterization 09/2016 showing only mild atherosclerosis   DDD (degenerative disc disease), lumbar    Depression    Diabetes mellitus without complication (HCC)    Essential hypertension    Falls    GERD (gastroesophageal reflux disease)    Hiatal hernia    History of stroke    Hypercholesteremia    IBS (irritable bowel syndrome)    Non-alcoholic fatty liver disease    OSA (obstructive sleep apnea)    no CPAP   Pericardial effusion    a. small by echo 2018.   PSVT (paroxysmal supraventricular tachycardia)    Recurrent UTI    Seizures ([place]) 2018   Stroke ([place]) 07/2019   several strokes within the last 10 years   Swallowing difficulty     Varices, esophageal ([place])     Past Surgical History:  Procedure Laterality Date   [place]  [phone]   Stent to the proximal RCA after MI    CARDIAC CATHETERIZATION N/A 09/26/2016   Procedure: Left Heart Cath and Coronary Angiography;  Surgeon: Leonie Man, MD;  Location: [place] [facility];  Service: Cardiovascular;  Laterality: N/A;   CATARACT EXTRACTION W/PHACO Right 07/04/2014   Procedure: CATARACT EXTRACTION PHACO AND INTRAOCULAR LENS PLACEMENT (IOC);  Surgeon: Elta Guadeloupe T. Gershon Crane, MD;  Location: [facility];  Service: Ophthalmology;  Laterality: Right;  CDE:13.13   COLONOSCOPY     DILATION AND CURETTAGE OF UTERUS     x2   Epi Retinal Membrane Peel Left    ERCP     ESOPHAGEAL BANDING N/A 04/01/2013   Procedure: ESOPHAGEAL BANDING;  Surgeon: Rogene Houston, MD;  [facility];  Service: Endoscopy;  Laterality: N/A;   ESOPHAGEAL BANDING N/A 05/24/2013   Procedure: ESOPHAGEAL BANDING;  Surgeon: Rogene Houston, MD;  [facility];  Service: Endoscopy;  Laterality: N/A;   ESOPHAGEAL BANDING N/A 06/21/2014   Procedure: ESOPHAGEAL BANDING;  Surgeon: Rogene Houston, MD;  [facility];  Service: Endoscopy;  Laterality: N/A;   ESOPHAGEAL BANDING  06/15/2020   Procedure: ESOPHAGEAL BANDING;  Surgeon:  Harvel Quale, MD;  [facility];  Service: Gastroenterology;;   ESOPHAGOGASTRODUODENOSCOPY N/A 04/01/2013   Procedure: ESOPHAGOGASTRODUODENOSCOPY (EGD);  Surgeon: Rogene Houston, MD;  [facility];  Service: Endoscopy;  Laterality: N/A;  230-rescheduled to 8:30am Ann notified pt   ESOPHAGOGASTRODUODENOSCOPY N/A 05/24/2013   Procedure: ESOPHAGOGASTRODUODENOSCOPY (EGD);  Surgeon: Rogene Houston, MD;  [facility];  Service: Endoscopy;  Laterality: N/A;  730   ESOPHAGOGASTRODUODENOSCOPY N/A 06/21/2014   Procedure:  ESOPHAGOGASTRODUODENOSCOPY (EGD);  Surgeon: Rogene Houston, MD;  [facility];  Service: Endoscopy;  Laterality: N/A;  930-rescheduled 10/14 @ 1200 Ann to notify pt   ESOPHAGOGASTRODUODENOSCOPY (EGD) WITH PROPOFOL N/A 06/15/2020   Procedure: ESOPHAGOGASTRODUODENOSCOPY (EGD) WITH PROPOFOL;  Surgeon: Harvel Quale, MD;  [facility];  Service: Gastroenterology;  Laterality: N/A;  230   ESOPHAGOGASTRODUODENOSCOPY (EGD) WITH PROPOFOL N/A 07/27/2020   Procedure: ESOPHAGOGASTRODUODENOSCOPY (EGD) WITH PROPOFOL;  Surgeon: Harvel Quale, MD;  [facility];  Service: Gastroenterology;  Laterality: N/A;  1245   ESOPHAGOGASTRODUODENOSCOPY (EGD) WITH PROPOFOL N/A 08/19/2022   Procedure: ESOPHAGOGASTRODUODENOSCOPY (EGD) WITH PROPOFOL;  Surgeon: Harvel Quale, MD;  [facility];  Service: Gastroenterology;  Laterality: N/A;  130 ASA 3, pt knows to arrive at [place]   cataract surgery of the left eye   FEMUR IM NAIL Left 03/02/2017   Procedure: INTRAMEDULLARY (IM) NAIL FEMORAL;  Surgeon: Rod Can, MD;  Location: [place];  Service: Orthopedics;  Laterality: Left;   HARDWARE REMOVAL Right 01/17/2013   Procedure: REMOVAL OF HARDWARE AND EXCISION ULNAR STYLOID RIGHT WRIST;  Surgeon: Tennis Must, MD;  Location: [place];  Service: Orthopedics;  Laterality: Right;   implanted heart monitor  02/2020   to monitor Afib   MYRINGOTOMY  2012   both ears   ORIF FEMUR FRACTURE Left 04/23/2017   Procedure: OPEN REDUCTION INTERNAL FIXATION (ORIF) DISTAL FEMUR FRACTURE (FRACTURE AROUND FEMORAL NAIL);  Surgeon: Altamese Sparks, MD;  Location: [place];  Service: Orthopedics;  Laterality: Left;   TONSILLECTOMY     TUBAL LIGATION     VAGINAL HYSTERECTOMY  1972   WRIST SURGERY     rt wrist hardwear removal    Allergies: Tape  Medications: Prior to Admission medications   Medication Sig Start Date End Date  Taking? Authorizing Provider  acetaminophen (TYLENOL) 500 MG tablet Take 1,000 mg by mouth at bedtime.    [provider]  apixaban (ELIQUIS) 5 MG TABS tablet TAKE (1) TABLET BY MOUTH TWICE DAILY. 02/21/22   Arnoldo Lenis, MD  atorvastatin (LIPITOR) 80 MG tablet Take 1 tablet (80 mg total) by mouth daily. Patient taking differently: Take 80 mg by mouth at bedtime. 07/15/18   Angiulli, Lavon Paganini, PA-C  azelastine (ASTELIN) 0.1 % nasal spray Place 1 spray into both nostrils at bedtime. 11/06/20   [provider]  CALCIUM CITRATE PO Take 200 mg by mouth daily.    [provider]  carvedilol (COREG) 6.25 MG tablet TAKE (1) TABLET BY MOUTH TWICE DAILY WITH A MEAL. 07/30/22   Carlan, Deatra Robinson, NP  Cholecalciferol (VITAMIN D3) 50 MCG (2000 UT) TABS Take 2,000 Units by mouth daily.    [provider]  Coenzyme Q10 (CO Q-10) 100 MG CAPS Take 100 mg by mouth in the morning.    [provider]  diltiazem (CARDIZEM CD) 180 MG 24 hr capsule Take 180 mg by mouth  daily. 05/13/22   [provider]  doxepin (SINEQUAN) 25 MG capsule Take 25 mg by mouth at bedtime. 06/24/22   [provider]  doxylamine, Sleep, (UNISOM) 25 MG tablet Take 1 tablet (25 mg total) by mouth at bedtime as needed. Patient taking differently: Take 25 mg by mouth at bedtime. 04/14/22   Barton Dubois, MD  furosemide (LASIX) 40 MG tablet Take 0.5 tablets (20 mg total) by mouth daily. TAKE (1) TABLET BY MOUTH ONCE DAILY. MAY TAKE ADDITIONAL HALF IF NEEDED FOR SEVERE SWELLING. Strength: 20 mg Patient taking differently: Take 20-40 mg by mouth See admin instructions. Alternate taking 20 mg one day and 40 mg the next 04/14/22   Barton Dubois, MD  lactulose (CHRONULAC) 10 GM/15ML solution Take 30 mLs (20 g total) by mouth 2 (two) times daily. 08/19/22 11/17/22  Harvel Quale, MD  loperamide (IMODIUM) 2 MG capsule Take 2 mg by mouth as needed for diarrhea or loose stools.     [provider]  Multiple Vitamin (MULTIVITAMIN WITH MINERALS) TABS tablet Take 1 tablet by mouth daily.    [provider]  nitroGLYCERIN (NITROSTAT) 0.4 MG SL tablet Place 0.4 mg under the tongue every 5 (five) minutes as needed for chest pain. Patient not taking: Reported on 08/21/2022 08/22/20   [provider]  omeprazole (PRILOSEC) 40 MG capsule Take 1 capsule (40 mg total) by mouth daily. 12/30/21   Carlan, Chelsea L, NP  ondansetron (ZOFRAN-ODT) 4 MG disintegrating tablet Take 4 mg by mouth every 8 (eight) hours as needed for nausea or vomiting. Patient not taking: Reported on 08/21/2022    [provider]  OVER THE COUNTER MEDICATION CBD oil tincture 2.1 mg as needed up to three times daily for appetite.    [provider]  potassium chloride SA (KLOR-CON M) 20 MEQ tablet Take 20 mEq by mouth 2 (two) times daily. 04/19/22   [provider]     Family History  Problem Relation Age of Onset   Diabetes Mother    Heart failure Father    Heart failure Maternal Aunt     Social History   Socioeconomic History   Marital status: Widowed    Spouse name: Not on file   Number of children: 2   Years of education: 2 years college   Highest education level: Some college, no degree  Occupational History    Employer: RETIRED  Tobacco Use   Smoking status: Some Days    Packs/day: 0.25    Years: 50.00    Total pack years: 12.50    Types: Cigarettes    Passive exposure: Current   Smokeless tobacco: Never   Tobacco comments:    12/23/21 1 pack Q two weeks.  Vaping Use   Vaping Use: Never used  Substance and Sexual Activity   Alcohol use: No    Alcohol/week: 0.0 standard drinks of alcohol   Drug use: No   Sexual activity: Not Currently    Birth control/protection: Surgical  Other Topics Concern   Not on file  Social History Narrative   12/23/21  Lives with 3 grandchildren, sone and dgtr-in -law are across the st   Social  Determinants of Health   Financial Resource Strain: Low Risk  (07/15/2022)   Overall Financial Resource Strain (CARDIA)    Difficulty of Paying Living Expenses: Not hard at all  Food Insecurity: No Food Insecurity (09/26/2021)   Hunger Vital Sign    Worried About Running Out of Food  in the Last Year: Never true    [place] in the Last Year: Never true  Transportation Needs: No Transportation Needs (07/15/2022)   PRAPARE - Hydrologist (Medical): No    Lack of Transportation (Non-Medical): No  Physical Activity: Inactive (03/16/2019)   Exercise Vital Sign    Days of Exercise per Week: 0 days    Minutes of Exercise per Session: 0 min  Stress: No Stress Concern Present (03/16/2019)   [place]    Feeling of Stress : Not at all  Social Connections: Moderately Isolated (03/16/2019)   Social Connection and Isolation Panel [NHANES]    Frequency of Communication with Friends and Family: More than three times a week    Frequency of Social Gatherings with Friends and Family: Never    Attends Religious Services: 1 to 4 times per year    Active Member of [employer] or Organizations: No    Attends Archivist Meetings: Never    Marital Status: Widowed    ECOG Status: 1 - Symptomatic but completely ambulatory  Review of Systems: A 12 point ROS discussed and pertinent positives are indicated in the HPI above.  All other systems are negative.   Vital Signs: There were no vitals taken for this visit.  No physical examination was performed in lieu of virtual telephone clinic visit.  Imaging: CT AP 01/11/18   CT AP 04/12/22   MR Abdomen 07/24/22   Labs:  CBC: Recent Labs    04/12/22 1300 04/13/22 0534 04/14/22 0446 08/14/22 1028  WBC 25.4* 16.4* 10.9* 6.1  HGB 13.2 11.2* 10.9* 13.0  HCT 38.3 32.3* 32.4* 39.2  PLT 121* 86* 85* 143*    COAGS: Recent Labs    12/05/21 2300  04/12/22 1300 08/14/22 1028  INR 1.8* 2.2* 1.3*  APTT 35  --   --     BMP: Recent Labs    04/12/22 1300 04/13/22 0534 04/14/22 0446 08/14/22 1028  NA 138 140 138 137  K 2.3* 2.8* 3.9 3.7  CL 105 109 112* 107  CO2 '22 25 22 23  '$ GLUCOSE 164* 133* 113* 111*  BUN '18 21 22 15  '$ CALCIUM 8.9 8.5* 8.3* 9.2  CREATININE 1.46* 1.14* 0.98 0.98  GFRNONAA 36* 49* 59* 58*    LIVER FUNCTION TESTS: Recent Labs    12/06/21 0733 04/12/22 1300 04/14/22 0446 08/14/22 1028  BILITOT 1.4* 3.6* 1.7* 1.5*  AST 25 48* 75* 53*  ALT '17 21 28 '$ 36  ALKPHOS 101 101 74 153*  PROT 5.3* 6.1* 5.2* 6.2*  ALBUMIN 2.8* 3.0* 2.3* 3.0*    TUMOR MARKERS: AFP  9.3 (12/17/15) 35.6 (06/04/20) 203 (07/01/22)  Pathology: 08/15/22 FINAL MICROSCOPIC DIAGNOSIS:  A. LIVER, NEEDLE CORE BIOPSY:  - Well-differentiated hepatocellular carcinoma, see comment   COMMENT:  Histologic features may suggest scirrhous subtype of hepatocellular  carcinoma.  Dr. Arby Barrette reviewed the case and concurs with the above  diagnosis.  Dr. Maryelizabeth Kaufmann was paged on 08/18/2022.     Assessment and Plan: "Tommie" Kosek is an 82 year old female with NASH cirrhosis (Child Pugh B, ALBI 2) and biopsy proven unifocal left lobe hepatocellular carcinoma, possibly scirrhous subtype.  We discussed etiology and natural history of hepatocellular carcinoma in addition to treatment options from medical, surgical, and interventional options.  She would not be an optimal surgical candidate for hepatectomy or transplant.  Percutaneous ablation would be very challenging and risky  due to proximity to vasculature and bowel.  She would most likely benefit from transarterial embolization, specifically radioembolization with segmentectomy approach.    Risks and benefits of transarterial radioembolization were discussed with the patient including, but not limited to, bleeding, infection, vascular injury, contrast induced renal failure, post procedural pain, nausea,  vomiting, fatigue, progression of liver failure, chemical cholecystitis, or need for additional procedures.  All of the patient's and granddaughter's questions were answered, bother are agreeable to proceed.  Plan for hepatic angiogram with T[facility]-MAA mapping study at [facility] followed by radioembolization approximately 2 weeks after.  Plan for left radial approach and same day discharge home.    Electronically Signed: Suzette Battiest, MD 09/10/2022, 11:06 AM   I spent a total of  40 Minutes  in virtual telephone clinical consultation, greater than 50% of which was counseling/coordinating care for hepatocellular carcinoma.

## 2022-09-11 ENCOUNTER — Other Ambulatory Visit (HOSPITAL_COMMUNITY): Payer: Self-pay | Admitting: Interventional Radiology

## 2022-09-11 DIAGNOSIS — C220 Liver cell carcinoma: Secondary | ICD-10-CM

## 2022-09-15 ENCOUNTER — Encounter: Payer: Self-pay | Admitting: *Deleted

## 2022-09-15 ENCOUNTER — Ambulatory Visit: Payer: Self-pay | Admitting: *Deleted

## 2022-09-15 NOTE — Patient Outreach (Signed)
  Care Coordination   Follow Up Visit Note   09/15/2022 Name: Louis Gaw Brunkhorst MRN: [id number] DOB: Aug 19, 1941  Aura Camps Victory is a 82 y.o. year old female who sees Redmond School, MD for primary care. I spoke with  Aura Camps Desai by phone today.  What matters to the patients health and wellness today?  Ongoing self management of chronic medical conditions     Goals Addressed             This Visit's Progress    Care Coordination Services       Care Coordination Interventions: Evaluation of current treatment plan related to ongoing recovery from stroke and patient's adherence to plan as established by provider Discussed plans with patient for ongoing care management follow up and provided patient with direct contact information for care management team Assessed social determinant of health barriers Reviewed recent office notes and relevant reports Assessed for additional resource or care coordination needs. None at this time Encouraged to reach out to [facility] as needed          SDOH assessments and interventions completed:  Yes SDOH Interventions Today    Flowsheet Row Most Recent Value  SDOH Interventions   Transportation Interventions Intervention Not Indicated  Financial Strain Interventions Intervention Not Indicated        Care Coordination Interventions:  Yes, provided   Follow up plan: No further intervention required.   Encounter Outcome:  Pt. Visit Completed   Chong Sicilian, BSN, RN-BC [place]: [phone] Main #: [phone] g

## 2022-09-25 ENCOUNTER — Other Ambulatory Visit: Payer: Self-pay | Admitting: Radiology

## 2022-09-25 DIAGNOSIS — C220 Liver cell carcinoma: Secondary | ICD-10-CM

## 2022-09-25 NOTE — H&P (Signed)
Referring Physician(s): Jenetta Downer Mayorga,D/Katragadda,S  Supervising Physician: Ruthann Cancer  Patient Status:  [facility]  Chief Complaint:  Hepatocellular carcinoma  Subjective: Pt known to IR team from random liver bx in 2008 and left liver lesion bx on 08/15/22. She has a PMH sig for anemia, anxiety/depression, afib, carotid stenosis, cirrhosis, CAD with prior stenting,DDD, DM, HTN, GERD, HH, CVA, HLD,IBS,OSA, pericardial effusion, thrombocytopenia,  esophageal varices, and seizures. She now presents with newly diagnosed [place]. She underwent consultation with Dr. Serafina Royals on 09/10/22 to discuss treatment options for her left lobe [facility] and was deemed an appropriate candidate for hepatic Y-90 radioembolization. She is scheduled today for hepatic/visceral arterial roadmapping study with embolization/test Y-90 dosing. She currently denies fever,HA,CP,dyspnea, cough, abd/back pain,N/V or bleeding.     Past Medical History:  Diagnosis Date   Anemia    Anxiety    Aortic insufficiency    Moderate   Atrial fibrillation (HCC)    Back pain    Carotid stenosis, bilateral    Cirrhosis ([place])    Closed left femoral fracture (HCC) 04/21/2017   Coronary artery disease    Stent x 2 RCA 1995, cardiac catheterization 09/2016 showing only mild atherosclerosis   DDD (degenerative disc disease), lumbar    Depression    Diabetes mellitus without complication (HCC)    Essential hypertension    Falls    GERD (gastroesophageal reflux disease)    Hiatal hernia    History of stroke    Hypercholesteremia    IBS (irritable bowel syndrome)    Non-alcoholic fatty liver disease    OSA (obstructive sleep apnea)    no CPAP   Pericardial effusion    a. small by echo 2018.   PSVT (paroxysmal supraventricular tachycardia)    Recurrent UTI    Seizures ([place]) 2018   Stroke ([place]) 07/2019   several strokes within the last 10 years   Swallowing difficulty    Varices, esophageal ([place])    Past Surgical History:   Procedure Laterality Date   [place]  [phone]   Stent to the proximal RCA after MI    CARDIAC CATHETERIZATION N/A 09/26/2016   Procedure: Left Heart Cath and Coronary Angiography;  Surgeon: Leonie Man, MD;  Location: [facility];  Service: Cardiovascular;  Laterality: N/A;   CATARACT EXTRACTION W/PHACO Right 07/04/2014   Procedure: CATARACT EXTRACTION PHACO AND INTRAOCULAR LENS PLACEMENT (IOC);  Surgeon: Elta Guadeloupe T. Gershon Crane, MD;  Location: [facility];  Service: Ophthalmology;  Laterality: Right;  CDE:13.13   COLONOSCOPY     DILATION AND CURETTAGE OF UTERUS     x2   Epi Retinal Membrane Peel Left    ERCP     ESOPHAGEAL BANDING N/A 04/01/2013   Procedure: ESOPHAGEAL BANDING;  Surgeon: Rogene Houston, MD;  [facility];  Service: Endoscopy;  Laterality: N/A;   ESOPHAGEAL BANDING N/A 05/24/2013   Procedure: ESOPHAGEAL BANDING;  Surgeon: Rogene Houston, MD;  [facility];  Service: Endoscopy;  Laterality: N/A;   ESOPHAGEAL BANDING N/A 06/21/2014   Procedure: ESOPHAGEAL BANDING;  Surgeon: Rogene Houston, MD;  [facility];  Service: Endoscopy;  Laterality: N/A;   ESOPHAGEAL BANDING  06/15/2020   Procedure: ESOPHAGEAL BANDING;  Surgeon: Harvel Quale, MD;  [facility];  Service: Gastroenterology;;   ESOPHAGOGASTRODUODENOSCOPY N/A 04/01/2013   Procedure: ESOPHAGOGASTRODUODENOSCOPY (EGD);  Surgeon: Bernadene Person  Gloriann Loan, MD;  [facility];  Service: Endoscopy;  Laterality: N/A;  230-rescheduled to 8:30am Ann notified pt   ESOPHAGOGASTRODUODENOSCOPY N/A 05/24/2013   Procedure: ESOPHAGOGASTRODUODENOSCOPY (EGD);  Surgeon: Rogene Houston, MD;  [facility];  Service: Endoscopy;  Laterality: N/A;  730   ESOPHAGOGASTRODUODENOSCOPY N/A 06/21/2014   Procedure: ESOPHAGOGASTRODUODENOSCOPY (EGD);  Surgeon: Rogene Houston, MD;  [facility];  Service: Endoscopy;  Laterality: N/A;  930-rescheduled 10/14 @ 1200 Ann to notify pt   ESOPHAGOGASTRODUODENOSCOPY (EGD) WITH PROPOFOL N/A 06/15/2020   Procedure: ESOPHAGOGASTRODUODENOSCOPY (EGD) WITH PROPOFOL;  Surgeon: Harvel Quale, MD;  [facility];  Service: Gastroenterology;  Laterality: N/A;  230   ESOPHAGOGASTRODUODENOSCOPY (EGD) WITH PROPOFOL N/A 07/27/2020   Procedure: ESOPHAGOGASTRODUODENOSCOPY (EGD) WITH PROPOFOL;  Surgeon: Harvel Quale, MD;  [facility];  Service: Gastroenterology;  Laterality: N/A;  1245   ESOPHAGOGASTRODUODENOSCOPY (EGD) WITH PROPOFOL N/A 08/19/2022   Procedure: ESOPHAGOGASTRODUODENOSCOPY (EGD) WITH PROPOFOL;  Surgeon: Harvel Quale, MD;  [facility];  Service: Gastroenterology;  Laterality: N/A;  130 ASA 3, pt knows to arrive at [place]   cataract surgery of the left eye   FEMUR IM NAIL Left 03/02/2017   Procedure: INTRAMEDULLARY (IM) NAIL FEMORAL;  Surgeon: Rod Can, MD;  Location: [facility];  Service: Orthopedics;  Laterality: Left;   HARDWARE REMOVAL Right 01/17/2013   Procedure: REMOVAL OF HARDWARE AND EXCISION ULNAR STYLOID RIGHT WRIST;  Surgeon: Tennis Must, MD;  Location: [facility];  Service: Orthopedics;  Laterality: Right;   implanted heart monitor  02/2020   to monitor Afib   IR RADIOLOGIST EVAL & MGMT  09/10/2022   MYRINGOTOMY  2012   both ears   ORIF FEMUR FRACTURE Left 04/23/2017   Procedure: OPEN REDUCTION INTERNAL FIXATION (ORIF) DISTAL FEMUR FRACTURE (FRACTURE AROUND FEMORAL NAIL);  Surgeon: Altamese [place], MD;  Location: [facility];  Service: Orthopedics;  Laterality: Left;   TONSILLECTOMY     TUBAL LIGATION     VAGINAL HYSTERECTOMY  1972   WRIST SURGERY     rt wrist hardwear removal      Allergies: Tape  Medications: Prior to Admission medications   Medication Sig Start Date End Date Taking? Authorizing Provider  acetaminophen  (TYLENOL) 500 MG tablet Take 1,000 mg by mouth at bedtime.    [provider]  apixaban (ELIQUIS) 5 MG TABS tablet TAKE (1) TABLET BY MOUTH TWICE DAILY. 02/21/22   Arnoldo Lenis, MD  atorvastatin (LIPITOR) 80 MG tablet Take 1 tablet (80 mg total) by mouth daily. Patient taking differently: Take 80 mg by mouth at bedtime. 07/15/18   Angiulli, Lavon Paganini, PA-C  azelastine (ASTELIN) 0.1 % nasal spray Place 1 spray into both nostrils at bedtime. 11/06/20   [provider]  CALCIUM CITRATE PO Take 200 mg by mouth daily.    [provider]  carvedilol (COREG) 6.25 MG tablet TAKE (1) TABLET BY MOUTH TWICE DAILY WITH A MEAL. 07/30/22   Carlan, Deatra Robinson, NP  Cholecalciferol (VITAMIN D3) 50 MCG (2000 UT) TABS Take 2,000 Units by mouth daily.    [provider]  Coenzyme Q10 (CO Q-10) 100 MG CAPS Take 100 mg by mouth in the morning.    [provider]  diltiazem (CARDIZEM CD) 180 MG 24 hr capsule Take 180 mg by mouth daily. 05/13/22   [provider]  doxepin (SINEQUAN) 25 MG capsule Take  25 mg by mouth at bedtime. 06/24/22   [provider]  doxylamine, Sleep, (UNISOM) 25 MG tablet Take 1 tablet (25 mg total) by mouth at bedtime as needed. Patient taking differently: Take 25 mg by mouth at bedtime. 04/14/22   Barton Dubois, MD  furosemide (LASIX) 40 MG tablet Take 0.5 tablets (20 mg total) by mouth daily. TAKE (1) TABLET BY MOUTH ONCE DAILY. MAY TAKE ADDITIONAL HALF IF NEEDED FOR SEVERE SWELLING. Strength: 20 mg Patient taking differently: Take 20-40 mg by mouth See admin instructions. Alternate taking 20 mg one day and 40 mg the next 04/14/22   Barton Dubois, MD  lactulose (CHRONULAC) 10 GM/15ML solution Take 30 mLs (20 g total) by mouth 2 (two) times daily. 08/19/22 11/17/22  Harvel Quale, MD  loperamide (IMODIUM) 2 MG capsule Take 2 mg by mouth as needed for diarrhea or loose stools.    [provider]  Multiple Vitamin  (MULTIVITAMIN WITH MINERALS) TABS tablet Take 1 tablet by mouth daily.    [provider]  nitroGLYCERIN (NITROSTAT) 0.4 MG SL tablet Place 0.4 mg under the tongue every 5 (five) minutes as needed for chest pain. Patient not taking: Reported on 08/21/2022 08/22/20   [provider]  omeprazole (PRILOSEC) 40 MG capsule Take 1 capsule (40 mg total) by mouth daily. 12/30/21   Carlan, Chelsea L, NP  ondansetron (ZOFRAN-ODT) 4 MG disintegrating tablet Take 4 mg by mouth every 8 (eight) hours as needed for nausea or vomiting. Patient not taking: Reported on 08/21/2022    [provider]  OVER THE COUNTER MEDICATION CBD oil tincture 2.1 mg as needed up to three times daily for appetite.    [provider]  potassium chloride SA (KLOR-CON M) 20 MEQ tablet Take 20 mEq by mouth 2 (two) times daily. 04/19/22   [provider]     Vital Signs: Vitals:   09/26/22 0733  BP: (!) 140/56  Pulse: 66  Resp: 15  Temp: 98 F (36.7 C)  SpO2: 94%      Physical Exam:  awake/alert; chest- CTA  bilat; heart- nl rate, irreg rhythm, +murmur; abd- soft,+BS,NT; trace pretibial edema bilat  Imaging: No results found.  Labs:  CBC: Recent Labs    04/12/22 1300 04/13/22 0534 04/14/22 0446 08/14/22 1028  WBC 25.4* 16.4* 10.9* 6.1  HGB 13.2 11.2* 10.9* 13.0  HCT 38.3 32.3* 32.4* 39.2  PLT 121* 86* 85* 143*    COAGS: Recent Labs    12/05/21 2300 04/12/22 1300 08/14/22 1028  INR 1.8* 2.2* 1.3*  APTT 35  --   --     BMP: Recent Labs    04/12/22 1300 04/13/22 0534 04/14/22 0446 08/14/22 1028  NA 138 140 138 137  K 2.3* 2.8* 3.9 3.7  CL 105 109 112* 107  CO2 '22 25 22 23  '$ GLUCOSE 164* 133* 113* 111*  BUN '18 21 22 15  '$ CALCIUM 8.9 8.5* 8.3* 9.2  CREATININE 1.46* 1.14* 0.98 0.98  GFRNONAA 36* 49* 59* 58*    LIVER FUNCTION TESTS: Recent Labs    12/06/21 0733 04/12/22 1300 04/14/22 0446 08/14/22 1028  BILITOT 1.4* 3.6* 1.7* 1.5*  AST 25 48*  75* 53*  ALT '17 21 28 '$ 36  ALKPHOS 101 101 74 153*  PROT 5.3* 6.1* 5.2* 6.2*  ALBUMIN 2.8* 3.0* 2.3* 3.0*    Assessment and Plan: Pt known to IR team from random liver bx in 2008 and left liver lesion bx on 08/15/22. She  has a PMH sig for anemia, anxiety/depression, afib, carotid stenosis, cirrhosis, CAD with prior stenting,DDD, DM, HTN, GERD, HH, CVA, HLD,IBS,OSA, pericardial effusion, thrombocytopenia, esophageal varices, and seizures. She now presents with newly diagnosed [place]. She underwent consultation with Dr. Serafina Royals on 09/10/22 to discuss treatment options for her left lobe [facility] and was deemed an appropriate candidate for hepatic Y-90 radioembolization. She is scheduled today for hepatic/visceral arterial roadmapping study with embolization/test Y-90 dosing. Risks and benefits of procedure were discussed with the patient/granddaughter including, but not limited to bleeding, infection, vascular injury or contrast induced renal failure.  This interventional procedure involves the use of X-rays and because of the nature of the planned procedure, it is possible that we will have prolonged use of X-ray fluoroscopy.  Potential radiation risks to you include (but are not limited to) the following: - A slightly elevated risk for cancer  several years later in life. This risk is typically less than 0.5% percent. This risk is low in comparison to the normal incidence of human cancer, which is 33% for women and 50% for men according to the [place]. - Radiation induced injury can include skin redness, resembling a rash, tissue breakdown / ulcers and hair loss (which can be temporary or permanent).   The likelihood of either of these occurring depends on the difficulty of the procedure and whether you are sensitive to radiation due to previous procedures, disease, or genetic conditions.   IF your procedure requires a prolonged use of radiation, you will be notified and given written  instructions for further action.  It is your responsibility to monitor the irradiated area for the 2 weeks following the procedure and to notify your physician if you are concerned that you have suffered a radiation induced injury.    All of the patient's questions were answered, patient is agreeable to proceed.  Consent signed and in chart.  Today's creat 1.33, t bili 1.2, plts 148k; last dose of eliquis was on 1/17 am    Electronically Signed: D. Rowe Robert, PA-C 09/25/2022, 7:09 PM   I spent a total of 25 minutes at the the patient's bedside AND on the patient's hospital floor or unit, greater than 50% of which was counseling/coordinating care for hepatic/visceral arteriogram with embolization/test Y-90 dosing

## 2022-09-26 ENCOUNTER — Other Ambulatory Visit (HOSPITAL_COMMUNITY): Payer: Self-pay | Admitting: Interventional Radiology

## 2022-09-26 ENCOUNTER — Ambulatory Visit (HOSPITAL_COMMUNITY)
Admission: RE | Admit: 2022-09-26 | Discharge: 2022-09-26 | Disposition: A | Discharge status: Home / Self Care | Payer: Medicare Other | Source: Ambulatory Visit | Attending: Interventional Radiology | Admitting: Interventional Radiology

## 2022-09-26 ENCOUNTER — Encounter (HOSPITAL_COMMUNITY)
Admission: RE | Admit: 2022-09-26 | Discharge: 2022-09-26 | Disposition: A | Discharge status: Home / Self Care | Payer: Medicare Other | Source: Ambulatory Visit | Attending: Interventional Radiology | Admitting: Interventional Radiology

## 2022-09-26 ENCOUNTER — Encounter (HOSPITAL_COMMUNITY): Payer: Self-pay

## 2022-09-26 ENCOUNTER — Other Ambulatory Visit: Payer: Self-pay

## 2022-09-26 DIAGNOSIS — C220 Liver cell carcinoma: Secondary | ICD-10-CM

## 2022-09-26 DIAGNOSIS — K219 Gastro-esophageal reflux disease without esophagitis: Secondary | ICD-10-CM | POA: Diagnosis not present

## 2022-09-26 DIAGNOSIS — Z8673 Personal history of transient ischemic attack (TIA), and cerebral infarction without residual deficits: Secondary | ICD-10-CM | POA: Diagnosis not present

## 2022-09-26 DIAGNOSIS — E119 Type 2 diabetes mellitus without complications: Secondary | ICD-10-CM | POA: Insufficient documentation

## 2022-09-26 DIAGNOSIS — G4733 Obstructive sleep apnea (adult) (pediatric): Secondary | ICD-10-CM | POA: Insufficient documentation

## 2022-09-26 DIAGNOSIS — E785 Hyperlipidemia, unspecified: Secondary | ICD-10-CM | POA: Insufficient documentation

## 2022-09-26 DIAGNOSIS — K7460 Unspecified cirrhosis of liver: Secondary | ICD-10-CM | POA: Insufficient documentation

## 2022-09-26 DIAGNOSIS — I2510 Atherosclerotic heart disease of native coronary artery without angina pectoris: Secondary | ICD-10-CM | POA: Insufficient documentation

## 2022-09-26 DIAGNOSIS — F329 Major depressive disorder, single episode, unspecified: Secondary | ICD-10-CM | POA: Diagnosis not present

## 2022-09-26 DIAGNOSIS — I708 Atherosclerosis of other arteries: Secondary | ICD-10-CM | POA: Diagnosis not present

## 2022-09-26 DIAGNOSIS — D649 Anemia, unspecified: Secondary | ICD-10-CM | POA: Diagnosis not present

## 2022-09-26 DIAGNOSIS — I10 Essential (primary) hypertension: Secondary | ICD-10-CM | POA: Insufficient documentation

## 2022-09-26 DIAGNOSIS — I6523 Occlusion and stenosis of bilateral carotid arteries: Secondary | ICD-10-CM | POA: Insufficient documentation

## 2022-09-26 DIAGNOSIS — F419 Anxiety disorder, unspecified: Secondary | ICD-10-CM | POA: Diagnosis not present

## 2022-09-26 DIAGNOSIS — I4891 Unspecified atrial fibrillation: Secondary | ICD-10-CM | POA: Diagnosis not present

## 2022-09-26 DIAGNOSIS — Z955 Presence of coronary angioplasty implant and graft: Secondary | ICD-10-CM | POA: Insufficient documentation

## 2022-09-26 HISTORY — PX: IR 3D INDEPENDENT WKST: IMG2385

## 2022-09-26 HISTORY — PX: IR INTRAVASCULAR ULTRASOUND NON CORONARY: IMG6085

## 2022-09-26 HISTORY — PX: IR ANGIOGRAM VISCERAL SELECTIVE: IMG657

## 2022-09-26 HISTORY — PX: IR TRANSCATH PLC STENT 1ST ART NOT LE CV CAR VERT CAR: IMG5443

## 2022-09-26 HISTORY — PX: IR US GUIDE VASC ACCESS RIGHT: IMG2390

## 2022-09-26 HISTORY — PX: IR ANGIOGRAM SELECTIVE EACH ADDITIONAL VESSEL: IMG667

## 2022-09-26 LAB — COMPREHENSIVE METABOLIC PANEL
ALT: 46 U/L — ABNORMAL HIGH (ref 0–44)
AST: 62 U/L — ABNORMAL HIGH (ref 15–41)
Albumin: 3.2 g/dL — ABNORMAL LOW (ref 3.5–5.0)
Alkaline Phosphatase: 149 U/L — ABNORMAL HIGH (ref 38–126)
Anion gap: 9 (ref 5–15)
BUN: 14 mg/dL (ref 8–23)
CO2: 25 mmol/L (ref 22–32)
Calcium: 9.3 mg/dL (ref 8.9–10.3)
Chloride: 104 mmol/L (ref 98–111)
Creatinine, Ser: 1.33 mg/dL — ABNORMAL HIGH (ref 0.44–1.00)
GFR, Estimated: 40 mL/min — ABNORMAL LOW (ref >60)
Glucose, Bld: 148 mg/dL — ABNORMAL HIGH (ref 70–99)
Potassium: 4.2 mmol/L (ref 3.5–5.1)
Sodium: 138 mmol/L (ref 135–145)
Total Bilirubin: 1.2 mg/dL (ref 0.3–1.2)
Total Protein: 6.7 g/dL (ref 6.5–8.1)

## 2022-09-26 LAB — CBC
HCT: 41.8 % (ref 36.0–46.0)
Hemoglobin: 13.6 g/dL (ref 12.0–15.0)
MCH: 31.3 pg (ref 26.0–34.0)
MCHC: 32.5 g/dL (ref 30.0–36.0)
MCV: 96.3 fL (ref 80.0–100.0)
Platelets: 148 10*3/uL — ABNORMAL LOW (ref 150–400)
RBC: 4.34 MIL/uL (ref 3.87–5.11)
RDW: 13.8 % (ref 11.5–15.5)
WBC: 6.3 10*3/uL (ref 4.0–10.5)
nRBC: 0 % (ref 0.0–0.2)

## 2022-09-26 LAB — APTT: aPTT: 33 seconds (ref 24–36)

## 2022-09-26 LAB — PROTIME-INR
INR: 1.3 — ABNORMAL HIGH (ref 0.8–1.2)
Prothrombin Time: 15.8 seconds — ABNORMAL HIGH (ref 11.4–15.2)

## 2022-09-26 MED ORDER — IOHEXOL 300 MG/ML  SOLN
100.0000 mL | Freq: Once | INTRAMUSCULAR | Status: AC | PRN
  Administered 2022-09-26: 50 mL via INTRA_ARTERIAL

## 2022-09-26 MED ORDER — TECHNETIUM TO 99M ALBUMIN AGGREGATED
4.0000 | Freq: Once | INTRAVENOUS | Status: AC | PRN
  Administered 2022-09-26: 4 via INTRAVENOUS

## 2022-09-26 MED ORDER — LIDOCAINE HCL 1 % IJ SOLN
INTRAMUSCULAR | Status: AC
  Filled 2022-09-26: qty 20

## 2022-09-26 MED ORDER — ASPIRIN 81 MG PO TBEC: 81.0000 mg | DELAYED_RELEASE_TABLET | Freq: Every day | ORAL | 12 refills | Status: DC

## 2022-09-26 MED ORDER — HEPARIN SODIUM (PORCINE) 1000 UNIT/ML IJ SOLN
INTRAMUSCULAR | Status: AC
  Filled 2022-09-26: qty 10

## 2022-09-26 MED ORDER — ONDANSETRON HCL 4 MG/2ML IJ SOLN
INTRAMUSCULAR | Status: AC
  Filled 2022-09-26: qty 2

## 2022-09-26 MED ORDER — FENTANYL CITRATE (PF) 100 MCG/2ML IJ SOLN
INTRAMUSCULAR | Status: AC
  Filled 2022-09-26: qty 2

## 2022-09-26 MED ORDER — MIDAZOLAM HCL 2 MG/2ML IJ SOLN
INTRAMUSCULAR | Status: AC | PRN
  Administered 2022-09-26 (×6): .5 mg via INTRAVENOUS

## 2022-09-26 MED ORDER — SODIUM CHLORIDE 0.9 % IV SOLN: INTRAVENOUS | Status: DC

## 2022-09-26 MED ORDER — ONDANSETRON HCL 4 MG/2ML IJ SOLN
INTRAMUSCULAR | Status: AC | PRN
  Administered 2022-09-26: 4 mg via INTRAVENOUS

## 2022-09-26 MED ORDER — FENTANYL CITRATE (PF) 100 MCG/2ML IJ SOLN
INTRAMUSCULAR | Status: AC | PRN
  Administered 2022-09-26 (×6): 25 ug via INTRAVENOUS

## 2022-09-26 MED ORDER — HEPARIN SODIUM (PORCINE) 1000 UNIT/ML IJ SOLN
INTRAMUSCULAR | Status: AC | PRN
  Administered 2022-09-26: 3000 [IU] via INTRAVENOUS

## 2022-09-26 MED ORDER — MIDAZOLAM HCL 2 MG/2ML IJ SOLN
INTRAMUSCULAR | Status: AC
  Filled 2022-09-26: qty 2

## 2022-09-26 MED ORDER — IOHEXOL 300 MG/ML  SOLN
100.0000 mL | Freq: Once | INTRAMUSCULAR | Status: AC | PRN
  Administered 2022-09-26: 10 mL via INTRA_ARTERIAL

## 2022-09-26 NOTE — Discharge Instructions (Addendum)
For questions /concerns may call Interventional Radiology at [phone] or  Interventional Radiology clinic [phone]   You may remove your dressing and shower tomorrow afternoon  Femoral Site Care This sheet gives you information about how to care for yourself after your procedure. Your health care provider may also give you more specific instructions. If you have problems or questions, contact your health care provider. What can I expect after the procedure? After the procedure, it is common to have: Bruising that usually fades within 1-2 weeks. Tenderness at the site. Follow these instructions at home: Wound care Follow instructions from your health care provider about how to take care of your insertion site. Make sure you: Wash your hands with soap and water before you change your bandage (dressing). If soap and water are not available, use hand sanitizer. Change your dressing as told by your health care provider. Leave stitches (sutures), skin glue, or adhesive strips in place. These skin closures may need to stay in place for 2 weeks or longer. If adhesive strip edges start to loosen and curl up, you may trim the loose edges. Do not remove adhesive strips completely unless your health care provider tells you to do that. Do not take baths, swim, or use a hot tub until your health care provider approves. You may shower 24-48 hours after the procedure or as told by your health care provider. Gently wash the site with plain soap and water. Pat the area dry with a clean towel. Do not rub the site. This may cause bleeding. Do not apply powder or lotion to the site. Keep the site clean and dry. Check your femoral site every day for signs of infection. Check for: Redness, swelling, or pain. Fluid or blood. Warmth. Pus or a bad smell. Activity For the first 2-3 days after your procedure, or as long as directed: Avoid climbing stairs as much as possible. Do not squat. Do not lift  anything that is heavier than 10 lb (4.5 kg), or the limit that you are told, until your health care provider says that it is safe. Rest as directed. Avoid sitting for a long time without moving. Get up to take short walks every 1-2 hours. Do not drive for 24 hours if you were given a medicine to help you relax (sedative). General instructions Take over-the-counter and prescription medicines only as told by your health care provider. Keep all follow-up visits as told by your health care provider. This is important. Contact a health care provider if you have: A fever or chills. You have redness, swelling, or pain around your insertion site. Get help right away if: The catheter insertion area swells very fast. You pass out. You suddenly start to sweat or your skin gets clammy. The catheter insertion area is bleeding, and the bleeding does not stop when you hold steady pressure on the area. The area near or just beyond the catheter insertion site becomes pale, cool, tingly, or numb. These symptoms may represent a serious problem that is an emergency. Do not wait to see if the symptoms will go away. Get medical help right away. Call your local emergency services (911 in the [place] Do not drive yourself to the hospital. Summary After the procedure, it is common to have bruising that usually fades within 1-2 weeks. Check your femoral site every day for signs of infection. Do not lift anything that is heavier than 10 lb (4.5 kg), or the limit that you are told, until your health care  provider says that it is safe. This information is not intended to replace advice given to you by your health care provider. Make sure you discuss any questions you have with your health care provider.  Y-90 PRE and POST     If Femoral approach: strict bedrest, lay flat and keep leg straight 3-4 hours.  Change dressing to tegaderm before discharge At Pre Y90 discharge give patient discharge instructions for POST  Y-90 radiation precautions to prepare them prior to POST Y-90 discharge  If radial approach:  TR Band, bedrest 2-3 hours    Pre Y90-Hepatic Artery Radioembolization, Care After This sheet gives you information about how to care for yourself after your procedure. Your health care provider may also give you more specific instructions. If you have problems or questions, contact your health care provider. What can I expect after the procedure? After the procedure, it is common to have: A slight fever for 1-2 weeks. If your fever gets worse, tell your health care provider. Fatigue. Loss of appetite. This should gradually improve after about 1 week. Abdominal pain on your right side. Soreness and tenderness in your groin area where the needle and catheter were placed (puncture site). Follow these instructions at home:  Puncture site care Follow instructions from your health care provider about how to take care of the puncture site. Make sure you: Wash your hands with soap and water before you change your bandage (dressing). If soap and water are not available, use hand sanitizer. Change your dressing as told by your health care provider. Leave stitches (sutures), skin glue, or adhesive strips in place. These skin closures may need to stay in place for 2 weeks or longer. If adhesive strip edges start to loosen and curl up, you may trim the loose edges. Do not remove adhesive strips completely unless your health care provider tells you to do that. Check your puncture site every day for signs of infection. Check for: More redness, swelling, or pain. More fluid or blood. Warmth. Pus or a bad smell. Activity Rest and return to your normal activities as told by your health care provider. Ask your health care provider what activities are safe for you. Do not drive for 24 hours after the procedure if you were given a medicine to help you relax (sedative). Do not lift anything that is heavier than 10  lb (4.5 kg) until your health care provider says that it is safe. Medicines Take over-the-counter and prescription medicines only as told by your health care provider. Do not drive or use heavy machinery while taking prescription pain medicine. Radiation precautions For up to a week after your procedure, there will be a small amount of radioactivity near your liver. This is not especially dangerous to other people. However, you should follow these precautions for 7 days: Do not come in close contact with people. Do not sleep in the same bed as someone else. Do not hold children or babies. Do not have contact with pregnant women. General instructions To prevent or treat constipation while you are taking prescription pain medicine, your health care provider may recommend that you: Drink enough fluid to keep your urine clear or pale yellow. Take over-the-counter or prescription medicines. Eat foods that are high in fiber, such as fresh fruits and vegetables, whole grains, and beans. Limit foods that are high in fat and processed sugars, such as fried and sweet foods. Eat frequent small meals until your appetite returns. Follow instructions from your health care provider  about eating or drinking restrictions. Do not take baths, swim, or use a hot tub until your health care provider approves. You may take showers. Wash your puncture site with mild soap and water and pat the area dry. Wear compression stockings as told by your health care provider. These stockings help to prevent blood clots and reduce swelling in your legs. Keep all follow-up visits as told by your health care provider. This is important. You may need to have blood tests and imaging tests done. Contact a health care provider if: You have more redness, swelling, or pain around your puncture site. You have more fluid or blood coming from your puncture site. Your puncture site feels warm to the touch. You have pus or a bad smell  coming from your puncture site. You have pain that: Gets worse. Does not get better with medicine. Feels like very bad heartburn. Is in the middle of your abdomen, above your belly button. Your skin and the white parts of your eyes turn yellow (jaundice). The color of your urine changes to dark brown. The color of your stool changes to light yellow. Your abdominal measurement (girth) increases in a short period of time. You gain more than 5 lb (2.3 kg) in a short period of time. Get help right away if: You have a fever that lasts longer than 2 weeks or is higher than what your health care provider told you to expect. You develop any of the following in your legs: Pain. Swelling. Skin that is cold or pale or turns blue. You have chest pain. You have blood in your vomit, saliva, or stool. You have trouble breathing. This information is not intended to replace advice given to you by your health care provider. Make sure you discuss any questions you have with your health care provider. Document Revised: 08/07/2017 Document Reviewed: 05/24/2016  Radiation precautions after next procedure Post Y-90 Radioembolization Discharge Instructions  You have been given a radioactive material during your procedure.  While it is safe for you to be discharged home from the hospital, you need to proceed directly home.    Do not use public transportation, including air travel, lasting more than 2 hours for 1 week.  Avoid crowded public places for 1 week.  Adult visitors should try to avoid close contact with you for 1 week.    Children and pregnant females should not visit or have close contact with you for 1 week.  Items that you touch are not radioactive.  Do not sleep in the same bed as your partner for 1 week, and a condom should be used for sexual activity during the first 24 hours.  Your blood may be radioactive and caution should be used if any bleeding occurs during the recovery  period.  Body fluids may be radioactive for 24 hours.  Wash your hands after voiding.  Men should sit to urinate.  Dispose of any soiled materials (flush down toilet or place in trash at home) during the first day.  Drink 6 to 8 glasses of fluids per day for 5 days to hydrate yourself.  If you need to see a doctor during the first week, you must let them know that you were treated with yttrium-90 microspheres, and will be slightly radioactive.  They can call Interventional Radiology [phone] with any questions.    Moderate Conscious Sedation, Adult, Care After This sheet gives you information about how to care for yourself after your procedure. Your health care provider may  also give you more specific instructions. If you have problems or questions, contact your health careprovider. What can I expect after the procedure? After the procedure, it is common to have: Sleepiness for several hours. Impaired judgment for several hours. Difficulty with balance. Vomiting if you eat too soon. Follow these instructions at home: For the time period you were told by your health care provider: Rest. Do not participate in activities where you could fall or become injured. Do not drive or use machinery. Do not drink alcohol. Do not take sleeping pills or medicines that cause drowsiness. Do not make important decisions or sign legal documents. Do not take care of children on your own. Eating and drinking  Follow the diet recommended by your health care provider. Drink enough fluid to keep your urine pale yellow. If you vomit: Drink water, juice, or soup when you can drink without vomiting. Make sure you have little or no nausea before eating solid foods.  General instructions Take over-the-counter and prescription medicines only as told by your health care provider. Have a responsible adult stay with you for the time you are told. It is important to have someone help care for you until you are  awake and alert. Do not smoke. Keep all follow-up visits as told by your health care provider. This is important. Contact a health care provider if: You are still sleepy or having trouble with balance after 24 hours. You feel light-headed. You keep feeling nauseous or you keep vomiting. You develop a rash. You have a fever. You have redness or swelling around the IV site. Get help right away if: You have trouble breathing. You have new-onset confusion at home. Summary After the procedure, it is common to feel sleepy, have impaired judgment, or feel nauseous if you eat too soon. Rest after you get home. Know the things you should not do after the procedure. Follow the diet recommended by your health care provider and drink enough fluid to keep your urine pale yellow. Get help right away if you have trouble breathing or new-onset confusion at home. This information is not intended to replace advice given to you by your health care provider. Make sure you discuss any questions you have with your healthcare provider. Document Revised: 12/23/2019 Document Reviewed: 07/21/2019 Elsevier Patient Education  2022 [employer].

## 2022-09-26 NOTE — Procedures (Signed)
Interventional Radiology Procedure Note  Procedure:  1) Abdominal aortogram 2) Celiac artery angiogram, recanalization, and stent placement 3) Left hepatic, segment III and segment II catheterization and angriography 4) Tc-MAA administration into segment II branch artery  Findings: Please refer to procedural dictation for full description.  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  Balloon angioplasty and 7 x 19 mm VBX stent placement.  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  TcMAA delivered via proximal segment II branch.  8 Fr Angioseal to right CFA  Complications: None immediate  Estimated Blood Loss: <5 mL  Recommendations: Strict 4 hour bedrest (head of bed flat until 13:45, up to 30 degrees until 15:45). IR will arrange 2 week follow up after review of Tc-MAA study for Y-90 administration.   Ruthann Cancer, MD

## 2022-09-26 NOTE — Progress Notes (Addendum)
Called IR- question on when to restart Eliquis..Dr. Serafina Royals stated 48 hrs and restart.   Marfa came by and stated talked ok to resume Eliquis and start ASA in 24 hrs. Updated granddaughter at bedside.

## 2022-09-29 NOTE — Progress Notes (Signed)
[facility] Summary Report / Loop Recorder

## 2022-10-06 ENCOUNTER — Ambulatory Visit: Payer: Medicare Other

## 2022-10-06 DIAGNOSIS — I639 Cerebral infarction, unspecified: Secondary | ICD-10-CM

## 2022-10-08 ENCOUNTER — Telehealth: Payer: Self-pay

## 2022-10-08 LAB — CUP PACEART REMOTE DEVICE CHECK
Date Time Interrogation Session: 20240127231544
Implantable Pulse Generator Implant Date: 20210602

## 2022-10-08 NOTE — Telephone Encounter (Signed)
    ILR summary report received. Battery status OK. Normal device function. No new symptom, tachy, brady, or pause episodes. There were 44 new AF episodes.  AF burden is 98.8%.  On [place].  There were 8 pause events that were 3 seconds.  Sent to triage.  Monthly summary reports and ROV/PRN  Spoke with patient she denied any syncope or dizziness.

## 2022-10-09 ENCOUNTER — Other Ambulatory Visit: Payer: Self-pay | Admitting: Internal Medicine

## 2022-10-09 ENCOUNTER — Other Ambulatory Visit (HOSPITAL_COMMUNITY): Payer: Self-pay | Admitting: Physician Assistant

## 2022-10-09 DIAGNOSIS — C220 Liver cell carcinoma: Secondary | ICD-10-CM

## 2022-10-09 MED ORDER — DEXAMETHASONE SODIUM PHOSPHATE 10 MG/ML IJ SOLN: 8.0000 mg | Freq: Once | INTRAMUSCULAR | Status: DC

## 2022-10-09 MED ORDER — SODIUM CHLORIDE 0.9 % IV SOLN: 2.0000 g | INTRAVENOUS | Status: DC

## 2022-10-10 ENCOUNTER — Other Ambulatory Visit (HOSPITAL_COMMUNITY): Payer: Self-pay | Admitting: Interventional Radiology

## 2022-10-10 ENCOUNTER — Other Ambulatory Visit (HOSPITAL_COMMUNITY): Payer: Self-pay | Admitting: Physician Assistant

## 2022-10-10 ENCOUNTER — Other Ambulatory Visit: Payer: Self-pay

## 2022-10-10 ENCOUNTER — Ambulatory Visit (HOSPITAL_COMMUNITY)
Admission: RE | Admit: 2022-10-10 | Discharge: 2022-10-10 | Disposition: A | Discharge status: Home / Self Care | Payer: Medicare Other | Source: Ambulatory Visit | Attending: Interventional Radiology | Admitting: Interventional Radiology

## 2022-10-10 ENCOUNTER — Encounter (HOSPITAL_COMMUNITY)
Admission: RE | Admit: 2022-10-10 | Discharge: 2022-10-10 | Disposition: A | Discharge status: Home / Self Care | Payer: Medicare Other | Source: Ambulatory Visit | Attending: Interventional Radiology | Admitting: Interventional Radiology

## 2022-10-10 ENCOUNTER — Encounter (HOSPITAL_COMMUNITY): Payer: Self-pay

## 2022-10-10 DIAGNOSIS — I4891 Unspecified atrial fibrillation: Secondary | ICD-10-CM | POA: Insufficient documentation

## 2022-10-10 DIAGNOSIS — F32A Depression, unspecified: Secondary | ICD-10-CM | POA: Insufficient documentation

## 2022-10-10 DIAGNOSIS — E785 Hyperlipidemia, unspecified: Secondary | ICD-10-CM | POA: Diagnosis not present

## 2022-10-10 DIAGNOSIS — K219 Gastro-esophageal reflux disease without esophagitis: Secondary | ICD-10-CM | POA: Insufficient documentation

## 2022-10-10 DIAGNOSIS — R569 Unspecified convulsions: Secondary | ICD-10-CM | POA: Diagnosis not present

## 2022-10-10 DIAGNOSIS — K7581 Nonalcoholic steatohepatitis (NASH): Secondary | ICD-10-CM | POA: Insufficient documentation

## 2022-10-10 DIAGNOSIS — K7460 Unspecified cirrhosis of liver: Secondary | ICD-10-CM | POA: Diagnosis not present

## 2022-10-10 DIAGNOSIS — G4733 Obstructive sleep apnea (adult) (pediatric): Secondary | ICD-10-CM | POA: Insufficient documentation

## 2022-10-10 DIAGNOSIS — D696 Thrombocytopenia, unspecified: Secondary | ICD-10-CM | POA: Insufficient documentation

## 2022-10-10 DIAGNOSIS — E119 Type 2 diabetes mellitus without complications: Secondary | ICD-10-CM | POA: Insufficient documentation

## 2022-10-10 DIAGNOSIS — F419 Anxiety disorder, unspecified: Secondary | ICD-10-CM | POA: Insufficient documentation

## 2022-10-10 DIAGNOSIS — C220 Liver cell carcinoma: Secondary | ICD-10-CM

## 2022-10-10 DIAGNOSIS — I6523 Occlusion and stenosis of bilateral carotid arteries: Secondary | ICD-10-CM | POA: Diagnosis not present

## 2022-10-10 DIAGNOSIS — I2510 Atherosclerotic heart disease of native coronary artery without angina pectoris: Secondary | ICD-10-CM | POA: Insufficient documentation

## 2022-10-10 DIAGNOSIS — I10 Essential (primary) hypertension: Secondary | ICD-10-CM | POA: Insufficient documentation

## 2022-10-10 HISTORY — PX: IR US GUIDE VASC ACCESS RIGHT: IMG2390

## 2022-10-10 HISTORY — PX: IR EMBO TUMOR ORGAN ISCHEMIA INFARCT INC GUIDE ROADMAPPING: IMG5449

## 2022-10-10 HISTORY — PX: IR ANGIOGRAM VISCERAL SELECTIVE: IMG657

## 2022-10-10 HISTORY — PX: IR ANGIOGRAM SELECTIVE EACH ADDITIONAL VESSEL: IMG667

## 2022-10-10 LAB — CBC WITH DIFFERENTIAL/PLATELET
Abs Immature Granulocytes: 0.01 10*3/uL (ref 0.00–0.07)
Basophils Absolute: 0 10*3/uL (ref 0.0–0.1)
Basophils Relative: 0 %
Eosinophils Absolute: 0.4 10*3/uL (ref 0.0–0.5)
Eosinophils Relative: 7 %
HCT: 38.5 % (ref 36.0–46.0)
Hemoglobin: 12.4 g/dL (ref 12.0–15.0)
Immature Granulocytes: 0 %
Lymphocytes Relative: 31 %
Lymphs Abs: 2 10*3/uL (ref 0.7–4.0)
MCH: 31.9 pg (ref 26.0–34.0)
MCHC: 32.2 g/dL (ref 30.0–36.0)
MCV: 99 fL (ref 80.0–100.0)
Monocytes Absolute: 0.8 10*3/uL (ref 0.1–1.0)
Monocytes Relative: 12 %
Neutro Abs: 3.3 10*3/uL (ref 1.7–7.7)
Neutrophils Relative %: 50 %
Platelets: 136 10*3/uL — ABNORMAL LOW (ref 150–400)
RBC: 3.89 MIL/uL (ref 3.87–5.11)
RDW: 13.8 % (ref 11.5–15.5)
WBC: 6.5 10*3/uL (ref 4.0–10.5)
nRBC: 0 % (ref 0.0–0.2)

## 2022-10-10 LAB — COMPREHENSIVE METABOLIC PANEL
ALT: 24 U/L (ref 0–44)
AST: 44 U/L — ABNORMAL HIGH (ref 15–41)
Albumin: 3 g/dL — ABNORMAL LOW (ref 3.5–5.0)
Alkaline Phosphatase: 132 U/L — ABNORMAL HIGH (ref 38–126)
Anion gap: 10 (ref 5–15)
BUN: 13 mg/dL (ref 8–23)
CO2: 19 mmol/L — ABNORMAL LOW (ref 22–32)
Calcium: 9.1 mg/dL (ref 8.9–10.3)
Chloride: 109 mmol/L (ref 98–111)
Creatinine, Ser: 1.02 mg/dL — ABNORMAL HIGH (ref 0.44–1.00)
GFR, Estimated: 56 mL/min — ABNORMAL LOW (ref >60)
Glucose, Bld: 132 mg/dL — ABNORMAL HIGH (ref 70–99)
Potassium: 4.9 mmol/L (ref 3.5–5.1)
Sodium: 138 mmol/L (ref 135–145)
Total Bilirubin: 1.6 mg/dL — ABNORMAL HIGH (ref 0.3–1.2)
Total Protein: 6.2 g/dL — ABNORMAL LOW (ref 6.5–8.1)

## 2022-10-10 LAB — PROTIME-INR
INR: 1.6 — ABNORMAL HIGH (ref 0.8–1.2)
Prothrombin Time: 18.8 seconds — ABNORMAL HIGH (ref 11.4–15.2)

## 2022-10-10 MED ORDER — ONDANSETRON HCL 4 MG/2ML IJ SOLN
4.0000 mg | Freq: Once | INTRAMUSCULAR | Status: AC
  Administered 2022-10-10: 4 mg via INTRAVENOUS
  Filled 2022-10-10: qty 2

## 2022-10-10 MED ORDER — SODIUM CHLORIDE 0.9 % IV SOLN
2.0000 g | Freq: Once | INTRAVENOUS | Status: AC
  Administered 2022-10-10: 2 g via INTRAVENOUS
  Filled 2022-10-10: qty 2

## 2022-10-10 MED ORDER — FENTANYL CITRATE (PF) 100 MCG/2ML IJ SOLN
INTRAMUSCULAR | Status: AC | PRN
  Administered 2022-10-10 (×2): 50 ug via INTRAVENOUS

## 2022-10-10 MED ORDER — SODIUM CHLORIDE 0.9 % IV SOLN
2.0000 g | INTRAVENOUS | Status: AC
  Filled 2022-10-10: qty 2

## 2022-10-10 MED ORDER — MIDAZOLAM HCL 2 MG/2ML IJ SOLN
INTRAMUSCULAR | Status: AC
  Filled 2022-10-10: qty 2

## 2022-10-10 MED ORDER — SODIUM CHLORIDE 0.9 % IV SOLN: INTRAVENOUS | Status: DC

## 2022-10-10 MED ORDER — FENTANYL CITRATE (PF) 100 MCG/2ML IJ SOLN
INTRAMUSCULAR | Status: AC
  Filled 2022-10-10: qty 2

## 2022-10-10 MED ORDER — LIDOCAINE HCL 1 % IJ SOLN: 20.0000 mL | Freq: Once | INTRAMUSCULAR | Status: AC

## 2022-10-10 MED ORDER — IOHEXOL 300 MG/ML  SOLN
100.0000 mL | Freq: Once | INTRAMUSCULAR | Status: AC | PRN
  Administered 2022-10-10: 45 mL via INTRA_ARTERIAL

## 2022-10-10 MED ORDER — DEXAMETHASONE SODIUM PHOSPHATE 10 MG/ML IJ SOLN: 8.0000 mg | Freq: Once | INTRAMUSCULAR | Status: DC

## 2022-10-10 MED ORDER — LIDOCAINE HCL 1 % IJ SOLN
INTRAMUSCULAR | Status: AC
  Administered 2022-10-10: 5 mL
  Filled 2022-10-10: qty 20

## 2022-10-10 MED ORDER — MIDAZOLAM HCL 2 MG/2ML IJ SOLN
INTRAMUSCULAR | Status: AC | PRN
  Administered 2022-10-10: 1 mg via INTRAVENOUS
  Administered 2022-10-10 (×2): .5 mg via INTRAVENOUS
  Administered 2022-10-10: 1 mg via INTRAVENOUS

## 2022-10-10 MED ORDER — DEXAMETHASONE SODIUM PHOSPHATE 10 MG/ML IJ SOLN
8.0000 mg | Freq: Once | INTRAMUSCULAR | Status: AC
  Administered 2022-10-10: 8 mg via INTRAVENOUS
  Filled 2022-10-10: qty 1

## 2022-10-10 MED ORDER — PANTOPRAZOLE SODIUM 40 MG IV SOLR
40.0000 mg | Freq: Once | INTRAVENOUS | Status: AC
  Administered 2022-10-10: 40 mg via INTRAVENOUS
  Filled 2022-10-10: qty 10

## 2022-10-10 NOTE — H&P (Signed)
Referring Physician(s): Jenetta Downer Mayorga,D/Katragadda,S   Supervising Physician: Ruthann Cancer  Patient Status:  [facility]  Chief Complaint:  Hepatocellular carcinoma   Subjective: Pt known to IR team from random liver bx in 2008 and left liver lesion bx on 08/15/22. She has a PMH sig for anemia, anxiety/depression, afib, carotid stenosis, cirrhosis, CAD with prior stenting,DDD, DM, HTN, GERD, HH, CVA, HLD,IBS,OSA, pericardial effusion, thrombocytopenia,  esophageal varices, and seizures. She now presents with newly diagnosed [place]. She underwent consultation with Dr. Serafina Royals on 09/10/22 to discuss treatment options for her left lobe [facility] and was deemed an appropriate candidate for hepatic Y-90 radioembolization . She underwent hepatic/visceral arterial roadmapping study on 09/26/22 and presents today for left hepatic Y-90 radioembolization. She currently denies fever,HA,CP,dyspnea,  abd/back pain,N/V or bleeding.  She has had occ cough and loose stools from lactulose.    Past Medical History:  Diagnosis Date   Anemia    Anxiety    Aortic insufficiency    Moderate   Atrial fibrillation (HCC)    Back pain    Carotid stenosis, bilateral    Cirrhosis ([place])    Closed left femoral fracture (HCC) 04/21/2017   Coronary artery disease    Stent x 2 RCA 1995, cardiac catheterization 09/2016 showing only mild atherosclerosis   DDD (degenerative disc disease), lumbar    Depression    Diabetes mellitus without complication (HCC)    Essential hypertension    Falls    GERD (gastroesophageal reflux disease)    Hiatal hernia    History of stroke    Hypercholesteremia    IBS (irritable bowel syndrome)    Non-alcoholic fatty liver disease    OSA (obstructive sleep apnea)    no CPAP   Pericardial effusion    a. small by echo 2018.   PSVT (paroxysmal supraventricular tachycardia)    Recurrent UTI    Seizures ([place]) 2018   Stroke ([place]) 07/2019   several strokes within the last 10 years    Swallowing difficulty    Varices, esophageal ([place])    Past Surgical History:  Procedure Laterality Date   [place]  [phone]   Stent to the proximal RCA after MI    CARDIAC CATHETERIZATION N/A 09/26/2016   Procedure: Left Heart Cath and Coronary Angiography;  Surgeon: Leonie Man, MD;  Location: [place] [facility];  Service: Cardiovascular;  Laterality: N/A;   CATARACT EXTRACTION W/PHACO Right 07/04/2014   Procedure: CATARACT EXTRACTION PHACO AND INTRAOCULAR LENS PLACEMENT (IOC);  Surgeon: Elta Guadeloupe T. Gershon Crane, MD;  Location: [facility];  Service: Ophthalmology;  Laterality: Right;  CDE:13.13   COLONOSCOPY     DILATION AND CURETTAGE OF UTERUS     x2   Epi Retinal Membrane Peel Left    ERCP     ESOPHAGEAL BANDING N/A 04/01/2013   Procedure: ESOPHAGEAL BANDING;  Surgeon: Rogene Houston, MD;  [facility];  Service: Endoscopy;  Laterality: N/A;   ESOPHAGEAL BANDING N/A 05/24/2013   Procedure: ESOPHAGEAL BANDING;  Surgeon: Rogene Houston, MD;  [facility];  Service: Endoscopy;  Laterality: N/A;   ESOPHAGEAL BANDING N/A 06/21/2014   Procedure: ESOPHAGEAL BANDING;  Surgeon: Rogene Houston, MD;  [facility];  Service: Endoscopy;  Laterality: N/A;   ESOPHAGEAL BANDING  06/15/2020   Procedure: ESOPHAGEAL BANDING;  Surgeon: Harvel Quale, MD;  [facility];  Service: Gastroenterology;;   ESOPHAGOGASTRODUODENOSCOPY N/A 04/01/2013   Procedure: ESOPHAGOGASTRODUODENOSCOPY (EGD);  Surgeon: Rogene Houston, MD;  [facility];  Service: Endoscopy;  Laterality: N/A;  230-rescheduled to 8:30am Ann notified pt   ESOPHAGOGASTRODUODENOSCOPY N/A 05/24/2013   Procedure: ESOPHAGOGASTRODUODENOSCOPY (EGD);  Surgeon: Rogene Houston, MD;  [facility];  Service: Endoscopy;  Laterality: N/A;  730   ESOPHAGOGASTRODUODENOSCOPY N/A 06/21/2014   Procedure:  ESOPHAGOGASTRODUODENOSCOPY (EGD);  Surgeon: Rogene Houston, MD;  [facility];  Service: Endoscopy;  Laterality: N/A;  930-rescheduled 10/14 @ 1200 Ann to notify pt   ESOPHAGOGASTRODUODENOSCOPY (EGD) WITH PROPOFOL N/A 06/15/2020   Procedure: ESOPHAGOGASTRODUODENOSCOPY (EGD) WITH PROPOFOL;  Surgeon: Harvel Quale, MD;  [facility];  Service: Gastroenterology;  Laterality: N/A;  230   ESOPHAGOGASTRODUODENOSCOPY (EGD) WITH PROPOFOL N/A 07/27/2020   Procedure: ESOPHAGOGASTRODUODENOSCOPY (EGD) WITH PROPOFOL;  Surgeon: Harvel Quale, MD;  [facility];  Service: Gastroenterology;  Laterality: N/A;  1245   ESOPHAGOGASTRODUODENOSCOPY (EGD) WITH PROPOFOL N/A 08/19/2022   Procedure: ESOPHAGOGASTRODUODENOSCOPY (EGD) WITH PROPOFOL;  Surgeon: Harvel Quale, MD;  [facility];  Service: Gastroenterology;  Laterality: N/A;  130 ASA 3, pt knows to arrive at [place]   cataract surgery of the left eye   FEMUR IM NAIL Left 03/02/2017   Procedure: INTRAMEDULLARY (IM) NAIL FEMORAL;  Surgeon: Rod Can, MD;  Location: [facility];  Service: Orthopedics;  Laterality: Left;   HARDWARE REMOVAL Right 01/17/2013   Procedure: REMOVAL OF HARDWARE AND EXCISION ULNAR STYLOID RIGHT WRIST;  Surgeon: Tennis Must, MD;  Location: [facility];  Service: Orthopedics;  Laterality: Right;   implanted heart monitor  02/2020   to monitor Afib   IR 3D INDEPENDENT WKST  09/26/2022   IR ANGIOGRAM SELECTIVE EACH ADDITIONAL VESSEL  09/26/2022   IR ANGIOGRAM SELECTIVE EACH ADDITIONAL VESSEL  09/26/2022   IR ANGIOGRAM SELECTIVE EACH ADDITIONAL VESSEL  09/26/2022   IR ANGIOGRAM SELECTIVE EACH ADDITIONAL VESSEL  09/26/2022   IR ANGIOGRAM SELECTIVE EACH ADDITIONAL VESSEL  09/26/2022   IR ANGIOGRAM VISCERAL SELECTIVE  09/26/2022   IR INTRAVASCULAR ULTRASOUND NON CORONARY  09/26/2022   IR RADIOLOGIST EVAL & MGMT  09/10/2022   IR TRANSCATH PLC  STENT 1ST ART NOT LE CV CAR VERT CAR  09/26/2022   IR US GUIDE VASC ACCESS RIGHT  09/26/2022   MYRINGOTOMY  2012   both ears   ORIF FEMUR FRACTURE Left 04/23/2017   Procedure: OPEN REDUCTION INTERNAL FIXATION (ORIF) DISTAL FEMUR FRACTURE (FRACTURE AROUND FEMORAL NAIL);  Surgeon: Altamese Christian, MD;  Location: [facility];  Service: Orthopedics;  Laterality: Left;   TONSILLECTOMY     TUBAL LIGATION     VAGINAL HYSTERECTOMY  1972   WRIST SURGERY     rt wrist hardwear removal      Allergies: Tape  Medications: Prior to Admission medications   Medication Sig Start Date End Date Taking? Authorizing Provider  acetaminophen (TYLENOL) 500 MG tablet Take 1,000 mg by mouth at bedtime.   Yes [provider]  apixaban (ELIQUIS) 5 MG TABS tablet TAKE (1) TABLET BY MOUTH TWICE DAILY. 02/21/22  Yes BranchAlphonse Guild, MD  aspirin EC 81 MG tablet Take 1 tablet (81 mg total) by mouth daily. Swallow whole. 09/26/22  Yes Suttle, Rosanne Ashing, MD  atorvastatin (LIPITOR) 80 MG tablet Take 1 tablet (80 mg total) by mouth daily. Patient taking differently: Take 80 mg by mouth  at bedtime. 07/15/18  Yes Angiulli, Lavon Paganini, PA-C  azelastine (ASTELIN) 0.1 % nasal spray Place 1 spray into both nostrils at bedtime. 11/06/20  Yes [provider]  CALCIUM CITRATE PO Take 200 mg by mouth daily.   Yes [provider]  carvedilol (COREG) 6.25 MG tablet TAKE (1) TABLET BY MOUTH TWICE DAILY WITH A MEAL. 07/30/22  Yes Carlan, Chelsea L, NP  Cholecalciferol (VITAMIN D3) 50 MCG (2000 UT) TABS Take 2,000 Units by mouth daily.   Yes [provider]  Coenzyme Q10 (CO Q-10) 100 MG CAPS Take 100 mg by mouth in the morning.   Yes [provider]  diltiazem (CARDIZEM CD) 180 MG 24 hr capsule Take 180 mg by mouth daily. 05/13/22  Yes [provider]  doxepin (SINEQUAN) 25 MG capsule Take 25 mg by mouth at bedtime. 06/24/22  Yes [provider]  doxylamine, Sleep, (UNISOM) 25 MG tablet  Take 1 tablet (25 mg total) by mouth at bedtime as needed. Patient taking differently: Take 25 mg by mouth at bedtime. 04/14/22  Yes Barton Dubois, MD  furosemide (LASIX) 40 MG tablet Take 0.5 tablets (20 mg total) by mouth daily. TAKE (1) TABLET BY MOUTH ONCE DAILY. MAY TAKE ADDITIONAL HALF IF NEEDED FOR SEVERE SWELLING. Strength: 20 mg Patient taking differently: Take 20-40 mg by mouth See admin instructions. Alternate taking 20 mg one day and 40 mg the next 04/14/22  Yes Barton Dubois, MD  lactulose (CHRONULAC) 10 GM/15ML solution Take 30 mLs (20 g total) by mouth 2 (two) times daily. 08/19/22 11/17/22 Yes Harvel Quale, MD  Multiple Vitamin (MULTIVITAMIN WITH MINERALS) TABS tablet Take 1 tablet by mouth daily.   Yes [provider]  omeprazole (PRILOSEC) 40 MG capsule Take 1 capsule (40 mg total) by mouth daily. 12/30/21  Yes Carlan, Chelsea L, NP  potassium chloride SA (KLOR-CON M) 20 MEQ tablet Take 20 mEq by mouth 2 (two) times daily. 04/19/22  Yes [provider]  loperamide (IMODIUM) 2 MG capsule Take 2 mg by mouth as needed for diarrhea or loose stools.    [provider]  nitroGLYCERIN (NITROSTAT) 0.4 MG SL tablet Place 0.4 mg under the tongue every 5 (five) minutes as needed for chest pain. Patient not taking: Reported on 08/21/2022 08/22/20   [provider]  ondansetron (ZOFRAN-ODT) 4 MG disintegrating tablet Take 4 mg by mouth every 8 (eight) hours as needed for nausea or vomiting. Patient not taking: Reported on 08/21/2022    [provider]  OVER THE COUNTER MEDICATION CBD oil tincture 2.1 mg as needed up to three times daily for appetite.    [provider]     Vital Signs: BP (!) 143/47 (BP Location: Right Arm)   Pulse 69   Temp 98 F (36.7 C) (Oral)   Resp 15   Ht '5\' 3"'$  (1.6 m)   Wt 133 lb (60.3 kg)   SpO2 97%   BMI 23.56 kg/m   Physical Exam: awake/answers simple questions ok; chest- CTA bilat; heart- nl  rate, irreg rhythm, +murmur; abd- soft,+BS,NT; trace pretibial edema bilat   Imaging: No results found.  Labs:  CBC: Recent Labs    04/13/22 0534 04/14/22 0446 08/14/22 1028 09/26/22 0756  WBC 16.4* 10.9* 6.1 6.3  HGB 11.2* 10.9* 13.0 13.6  HCT 32.3* 32.4* 39.2 41.8  PLT 86* 85* 143* 148*    COAGS: Recent Labs    12/05/21 2300 04/12/22 1300 08/14/22 1028 09/26/22 0756  INR  1.8* 2.2* 1.3* 1.3*  APTT 35  --   --  33    BMP: Recent Labs    04/13/22 0534 04/14/22 0446 08/14/22 1028 09/26/22 0756  NA 140 138 137 138  K 2.8* 3.9 3.7 4.2  CL 109 112* 107 104  CO2 '25 22 23 25  '$ GLUCOSE 133* 113* 111* 148*  BUN '21 22 15 14  '$ CALCIUM 8.5* 8.3* 9.2 9.3  CREATININE 1.14* 0.98 0.98 1.33*  GFRNONAA 49* 59* 58* 40*    LIVER FUNCTION TESTS: Recent Labs    04/12/22 1300 04/14/22 0446 08/14/22 1028 09/26/22 0756  BILITOT 3.6* 1.7* 1.5* 1.2  AST 48* 75* 53* 62*  ALT 21 28 36 46*  ALKPHOS 101 74 153* 149*  PROT 6.1* 5.2* 6.2* 6.7  ALBUMIN 3.0* 2.3* 3.0* 3.2*    Assessment and Plan: Pt known to IR team from random liver bx in 2008 and left liver lesion bx on 08/15/22. She has a PMH sig for anemia, anxiety/depression, afib, carotid stenosis, cirrhosis, CAD with prior stenting,DDD, DM, HTN, GERD, HH, CVA, HLD,IBS,OSA, pericardial effusion, thrombocytopenia,  esophageal varices, and seizures. She now presents with newly diagnosed [place]. She underwent consultation with Dr. Serafina Royals on 09/10/22 to discuss treatment options for her left lobe [facility] and was deemed an appropriate candidate for hepatic Y-90 radioembolization . She underwent hepatic/visceral arterial roadmapping study on 09/26/22 and presents today for left hepatic Y-90 radioembolization.Risks and benefits of procedure were discussed with the patient/granddaughter including, but not limited to bleeding, infection, vascular injury or contrast induced renal failure.  This interventional procedure involves the use of X-rays and  because of the nature of the planned procedure, it is possible that we will have prolonged use of X-ray fluoroscopy.  Potential radiation risks to you include (but are not limited to) the following: - A slightly elevated risk for cancer  several years later in life. This risk is typically less than 0.5% percent. This risk is low in comparison to the normal incidence of human cancer, which is 33% for women and 50% for men according to the [place]. - Radiation induced injury can include skin redness, resembling a rash, tissue breakdown / ulcers and hair loss (which can be temporary or permanent).   The likelihood of either of these occurring depends on the difficulty of the procedure and whether you are sensitive to radiation due to previous procedures, disease, or genetic conditions.   IF your procedure requires a prolonged use of radiation, you will be notified and given written instructions for further action.  It is your responsibility to monitor the irradiated area for the 2 weeks following the procedure and to notify your physician if you are concerned that you have suffered a radiation induced injury.    All of the patient's questions were answered, patient is agreeable to proceed.  Consent signed and in chart.  LABS PENDING    Electronically Signed: D. Rowe Robert, PA-C 10/10/2022, 8:24 AM   I spent a total of 20 minutes at the the patient's bedside AND on the patient's hospital floor or unit, greater than 50% of which was counseling/coordinating care for left hepatic Y-90 radioembolization

## 2022-10-10 NOTE — Procedures (Signed)
Interventional Radiology Procedure Note  Procedure:  Transarterial radioembolization, segment II  Findings: Please refer to procedural dictation for full description.  6 Fr right CFA Angioseal.  Complications: None immediate  Estimated Blood Loss: < 5 mL  Recommendations: Strict 4 hour bedrest.  Head of bed flat for 2 hours, then up to 30 degrees for 2 hours. IR will arrange for outpatient follow up in 2-3 weeks.   Ruthann Cancer, MD

## 2022-10-10 NOTE — Progress Notes (Addendum)
Patients Granddaughter stated that the patient drank Orange Juice with pulp sometime after midnight.  They realized that the patient had drank it around 0600 am but not exactly sure when she drank it but it was defiantly after midnight per the granddaughter.  The granddaughter stated she cannot confirm how much she drank. The granddaughter stated that the patient does have some memory impairment.   Dr. Ruthann Cancer with IR made aware. No further orders received.   Jasmine Pang BSN, Academic librarian - Perioperative Services [place] [phone]

## 2022-10-15 ENCOUNTER — Other Ambulatory Visit: Payer: Self-pay | Admitting: Interventional Radiology

## 2022-10-15 DIAGNOSIS — C220 Liver cell carcinoma: Secondary | ICD-10-CM

## 2022-10-16 ENCOUNTER — Encounter (HOSPITAL_COMMUNITY): Payer: Self-pay | Admitting: *Deleted

## 2022-10-17 ENCOUNTER — Other Ambulatory Visit: Payer: Self-pay | Admitting: Cardiology

## 2022-10-17 DIAGNOSIS — I4891 Unspecified atrial fibrillation: Secondary | ICD-10-CM

## 2022-10-17 NOTE — Telephone Encounter (Signed)
Prescription refill request for Eliquis received. Indication: Afib  Last office visit: 04/30/22 ([place])  Scr: 1.02 (10/10/22)  Age: 82 Weight: 60.3kg  Appropriate dose. Refill sent.

## 2022-10-23 ENCOUNTER — Ambulatory Visit: Payer: Medicare Other | Attending: Cardiology | Admitting: Cardiology

## 2022-10-23 ENCOUNTER — Encounter: Payer: Self-pay | Admitting: Cardiology

## 2022-10-23 VITALS — BP 144/84 | HR 68 | Ht 63.0 in | Wt 133.0 lb

## 2022-10-23 DIAGNOSIS — I350 Nonrheumatic aortic (valve) stenosis: Secondary | ICD-10-CM | POA: Diagnosis not present

## 2022-10-23 DIAGNOSIS — I2510 Atherosclerotic heart disease of native coronary artery without angina pectoris: Secondary | ICD-10-CM | POA: Diagnosis not present

## 2022-10-23 DIAGNOSIS — D6869 Other thrombophilia: Secondary | ICD-10-CM | POA: Diagnosis not present

## 2022-10-23 DIAGNOSIS — I6523 Occlusion and stenosis of bilateral carotid arteries: Secondary | ICD-10-CM | POA: Diagnosis not present

## 2022-10-23 DIAGNOSIS — I4891 Unspecified atrial fibrillation: Secondary | ICD-10-CM | POA: Diagnosis not present

## 2022-10-23 DIAGNOSIS — I10 Essential (primary) hypertension: Secondary | ICD-10-CM | POA: Diagnosis not present

## 2022-10-23 NOTE — Progress Notes (Signed)
Clinical Summary [name] is a 82 y.o.female seen today for follow up of the following medical problems.    1. PAF/Prior history of PSVT - history of cryptogenic stroke, loop recorder placed and detected afib - seen in afib clinic.  - PAF, she is on diltiazem and toprol. Eliquis for stroke prevention.    - no recent palpitations. Short pauses noted on loop recorder.  - compliant with meds. She reports GI started ASA (Sep 26 2022 celiac artery stent placed by IR).      2. CAD - history of MI in 1995, received 2 stents to RCA -  cath 2007 with patent coronaries.  - cath Jan 2018 nonobstructive disease - echo 10/2016 LVEF 65-70%, no WMAs   - admit 10/2016 with elevated troponin to 1.33, Thought to be demand ischemia related to her SVT at the time - admit 12/2016 with chest pain. Mild troponin 0.32 at that time. Managed medically due to recent normal cath and stable echo     - no chest pains, no SOB/DOE.    3. Carotid stenosis - 04/2016 moderate bilateral disease  08/2018 mild  12/2019 CTA: <50% bilateral disease -11/2021 CTA: Short-segment 70% stenosis at the left carotid bulb/proximal cervical left ICA, similar to previous. 4. Focal moderate right P2 stenosis, also similar. 5. Right larger than left layering pleural effusions, partially visualized.   4. Cryptogenic cirrhosis  - followed by GI   5. Aortic valve disease - 06/2018 moderate to severe AI, LVEF 65-70%, no LVIDs reported. Does not mention aortic stenosis however mean grad 27 but no AVA VTI given. From 04/2018 study had moderate AS with mean grad 21 and AVA VTI 1.06   12/2019 echo mean grad 19, AVA VTI 1.14 - no recent symptoms   02/2021 echo: LVEF 60-65%, no WMAs, indet diastolic, mod MS(mean grad 5), mod to severe AS(mean grad 26, AVA VTI 0.87, DI 0.34, SVI 32   02/2022 echo: LVEF 65-70%, AVA VTI 0.6, mean grad 24, DI 0.3, SVI 27 - sedentary lifestyle, limited due to chronic weakness.    08/2022 LVEF >75%, mod  MR mild MS mean grad 3, severe low flow AS flow/low gradient with preserved LVEF.  Aortic regurgitation PHT measures 355 msec. Aortic valve area, by VTI  measures 0.91 cm. Aortic valve mean gradient measures 24.0 mmHg.  Dimentionless index 0.29.      6. Mitral stenosis -12/2019 echo mean grad 5 - 02/2021 echo: LVEF 60-65%, no WMAs, indet diastolic, mod MS(mean grad 5), mod to severe AS(mean grad 26, AVA VTI 0.87, DI 0.34, SVI 32   02/2022 echo MV mean grad 5   7. History of CVA - admittion 07/2018, 12/2019  - has loop recorder for history of cryptogenic stroke.    8. Hyperlipidemia 06/2018 TC 109 TG 50 HDL 49 LDL 50 12/2019 TC 162 HDL 63 TG 78 LDL 83 -has been on atorvastatin 48m long term   - labs followed by pcp   11/2021 TC 102 TG 52 HDL 34 LDL 58    9. HTN -has not taken meds yet     10. Leg weakness - typically in wheel chair, but can use walker for short distances   11. Hepatocellular carcinoma - followed by GI and oncology    Past Medical History:  Diagnosis Date   Anemia    Anxiety    Aortic insufficiency    Moderate   Atrial fibrillation (HCC)    Back pain  Carotid stenosis, bilateral    Cirrhosis ([place])    Closed left femoral fracture ([place]) 04/21/2017   Coronary artery disease    Stent x 2 RCA 1995, cardiac catheterization 09/2016 showing only mild atherosclerosis   DDD (degenerative disc disease), lumbar    Depression    Diabetes mellitus without complication (HCC)    Essential hypertension    Falls    GERD (gastroesophageal reflux disease)    Hiatal hernia    History of stroke    Hypercholesteremia    IBS (irritable bowel syndrome)    Non-alcoholic fatty liver disease    OSA (obstructive sleep apnea)    no CPAP   Pericardial effusion    a. small by echo 2018.   PSVT (paroxysmal supraventricular tachycardia)    Recurrent UTI    Seizures ([place]) 2018   Stroke ([place]) 07/2019   several strokes within the last 10 years   Swallowing difficulty     Varices, esophageal (HCC)      Allergies  Allergen Reactions   Tape Rash and Other (See Comments)    Old paper tape caused a reaction, doesn't react to most other tapes      Current Outpatient Medications  Medication Sig Dispense Refill   acetaminophen (TYLENOL) 500 MG tablet Take 1,000 mg by mouth at bedtime.     apixaban (ELIQUIS) 5 MG TABS tablet TAKE (1) TABLET BY MOUTH TWICE DAILY. 60 tablet 2   aspirin EC 81 MG tablet Take 1 tablet (81 mg total) by mouth daily. Swallow whole. 30 tablet 12   atorvastatin (LIPITOR) 80 MG tablet Take 1 tablet (80 mg total) by mouth daily. (Patient taking differently: Take 80 mg by mouth at bedtime.) 30 tablet 1   azelastine (ASTELIN) 0.1 % nasal spray Place 1 spray into both nostrils at bedtime.     CALCIUM CITRATE PO Take 200 mg by mouth daily.     carvedilol (COREG) 6.25 MG tablet TAKE (1) TABLET BY MOUTH TWICE DAILY WITH A MEAL. 180 tablet 0   Cholecalciferol (VITAMIN D3) 50 MCG (2000 UT) TABS Take 2,000 Units by mouth daily.     Coenzyme Q10 (CO Q-10) 100 MG CAPS Take 100 mg by mouth in the morning.     diltiazem (CARDIZEM CD) 180 MG 24 hr capsule Take 180 mg by mouth daily.     doxepin (SINEQUAN) 25 MG capsule Take 25 mg by mouth at bedtime.     doxylamine, Sleep, (UNISOM) 25 MG tablet Take 1 tablet (25 mg total) by mouth at bedtime as needed. (Patient taking differently: Take 25 mg by mouth at bedtime.)     furosemide (LASIX) 40 MG tablet Take 0.5 tablets (20 mg total) by mouth daily. TAKE (1) TABLET BY MOUTH ONCE DAILY. MAY TAKE ADDITIONAL HALF IF NEEDED FOR SEVERE SWELLING. Strength: 20 mg (Patient taking differently: Take 20-40 mg by mouth See admin instructions. Alternate taking 20 mg one day and 40 mg the next) 45 tablet 0   lactulose (CHRONULAC) 10 GM/15ML solution Take 30 mLs (20 g total) by mouth 2 (two) times daily. 5400 mL 1   loperamide (IMODIUM) 2 MG capsule Take 2 mg by mouth as needed for diarrhea or loose stools.     Multiple  Vitamin (MULTIVITAMIN WITH MINERALS) TABS tablet Take 1 tablet by mouth daily.     nitroGLYCERIN (NITROSTAT) 0.4 MG SL tablet Place 0.4 mg under the tongue every 5 (five) minutes as needed for chest pain. (Patient not taking: Reported on 08/21/2022)  omeprazole (PRILOSEC) 40 MG capsule Take 1 capsule (40 mg total) by mouth daily. 90 capsule 3   ondansetron (ZOFRAN-ODT) 4 MG disintegrating tablet Take 4 mg by mouth every 8 (eight) hours as needed for nausea or vomiting. (Patient not taking: Reported on 08/21/2022)     OVER THE COUNTER MEDICATION CBD oil tincture 2.1 mg as needed up to three times daily for appetite.     potassium chloride SA (KLOR-CON M) 20 MEQ tablet Take 20 mEq by mouth 2 (two) times daily.     No current facility-administered medications for this visit.   Facility-Administered Medications Ordered in Other Visits  Medication Dose Route Frequency Provider Last Rate Last Admin   dexamethasone (DECADRON) injection 8 mg  8 mg Intravenous Once Boisseau, Hayley, PA         Past Surgical History:  Procedure Laterality Date   [place]  [id number]   Stent to the proximal RCA after MI    CARDIAC CATHETERIZATION N/A 09/26/2016   Procedure: Left Heart Cath and Coronary Angiography;  Surgeon: Leonie Man, MD;  Location: [facility];  Service: Cardiovascular;  Laterality: N/A;   CATARACT EXTRACTION W/PHACO Right 07/04/2014   Procedure: CATARACT EXTRACTION PHACO AND INTRAOCULAR LENS PLACEMENT (IOC);  Surgeon: Elta Guadeloupe T. Gershon Crane, MD;  Location: [facility];  Service: Ophthalmology;  Laterality: Right;  CDE:13.13   COLONOSCOPY     DILATION AND CURETTAGE OF UTERUS     x2   Epi Retinal Membrane Peel Left    ERCP     ESOPHAGEAL BANDING N/A 04/01/2013   Procedure: ESOPHAGEAL BANDING;  Surgeon: Rogene Houston, MD;  [facility];  Service: Endoscopy;  Laterality: N/A;   ESOPHAGEAL BANDING  N/A 05/24/2013   Procedure: ESOPHAGEAL BANDING;  Surgeon: Rogene Houston, MD;  [facility];  Service: Endoscopy;  Laterality: N/A;   ESOPHAGEAL BANDING N/A 06/21/2014   Procedure: ESOPHAGEAL BANDING;  Surgeon: Rogene Houston, MD;  [facility];  Service: Endoscopy;  Laterality: N/A;   ESOPHAGEAL BANDING  06/15/2020   Procedure: ESOPHAGEAL BANDING;  Surgeon: Harvel Quale, MD;  [facility];  Service: Gastroenterology;;   ESOPHAGOGASTRODUODENOSCOPY N/A 04/01/2013   Procedure: ESOPHAGOGASTRODUODENOSCOPY (EGD);  Surgeon: Rogene Houston, MD;  [facility];  Service: Endoscopy;  Laterality: N/A;  230-rescheduled to 8:30am Ann notified pt   ESOPHAGOGASTRODUODENOSCOPY N/A 05/24/2013   Procedure: ESOPHAGOGASTRODUODENOSCOPY (EGD);  Surgeon: Rogene Houston, MD;  [facility];  Service: Endoscopy;  Laterality: N/A;  730   ESOPHAGOGASTRODUODENOSCOPY N/A 06/21/2014   Procedure: ESOPHAGOGASTRODUODENOSCOPY (EGD);  Surgeon: Rogene Houston, MD;  [facility];  Service: Endoscopy;  Laterality: N/A;  930-rescheduled 10/14 @ 1200 Ann to notify pt   ESOPHAGOGASTRODUODENOSCOPY (EGD) WITH PROPOFOL N/A 06/15/2020   Procedure: ESOPHAGOGASTRODUODENOSCOPY (EGD) WITH PROPOFOL;  Surgeon: Harvel Quale, MD;  [facility];  Service: Gastroenterology;  Laterality: N/A;  230   ESOPHAGOGASTRODUODENOSCOPY (EGD) WITH PROPOFOL N/A 07/27/2020   Procedure: ESOPHAGOGASTRODUODENOSCOPY (EGD) WITH PROPOFOL;  Surgeon: Harvel Quale, MD;  [facility];  Service: Gastroenterology;  Laterality: N/A;  1245   ESOPHAGOGASTRODUODENOSCOPY (EGD) WITH PROPOFOL N/A 08/19/2022   Procedure: ESOPHAGOGASTRODUODENOSCOPY (EGD) WITH PROPOFOL;  Surgeon: Harvel Quale, MD;  [facility];  Service: Gastroenterology;  Laterality: N/A;  130 ASA 3, pt knows to arrive at 6:15  EYE SURGERY  08   cataract surgery of the  left eye   FEMUR IM NAIL Left 03/02/2017   Procedure: INTRAMEDULLARY (IM) NAIL FEMORAL;  Surgeon: Rod Can, MD;  Location: [facility];  Service: Orthopedics;  Laterality: Left;   HARDWARE REMOVAL Right 01/17/2013   Procedure: REMOVAL OF HARDWARE AND EXCISION ULNAR STYLOID RIGHT WRIST;  Surgeon: Tennis Must, MD;  Location: [facility];  Service: Orthopedics;  Laterality: Right;   implanted heart monitor  02/2020   to monitor Afib   IR 3D INDEPENDENT WKST  09/26/2022   IR ANGIOGRAM SELECTIVE EACH ADDITIONAL VESSEL  09/26/2022   IR ANGIOGRAM SELECTIVE EACH ADDITIONAL VESSEL  09/26/2022   IR ANGIOGRAM SELECTIVE EACH ADDITIONAL VESSEL  09/26/2022   IR ANGIOGRAM SELECTIVE EACH ADDITIONAL VESSEL  09/26/2022   IR ANGIOGRAM SELECTIVE EACH ADDITIONAL VESSEL  09/26/2022   IR ANGIOGRAM SELECTIVE EACH ADDITIONAL VESSEL  10/10/2022   IR ANGIOGRAM SELECTIVE EACH ADDITIONAL VESSEL  10/10/2022   IR ANGIOGRAM SELECTIVE EACH ADDITIONAL VESSEL  10/10/2022   IR ANGIOGRAM VISCERAL SELECTIVE  09/26/2022   IR ANGIOGRAM VISCERAL SELECTIVE  10/10/2022   IR EMBO TUMOR ORGAN ISCHEMIA INFARCT INC GUIDE ROADMAPPING  10/10/2022   IR INTRAVASCULAR ULTRASOUND NON CORONARY  09/26/2022   IR RADIOLOGIST EVAL & MGMT  09/10/2022   IR TRANSCATH PLC STENT 1ST ART NOT LE CV CAR VERT CAR  09/26/2022   IR US GUIDE VASC ACCESS RIGHT  09/26/2022   IR US GUIDE VASC ACCESS RIGHT  10/10/2022   MYRINGOTOMY  2012   both ears   ORIF FEMUR FRACTURE Left 04/23/2017   Procedure: OPEN REDUCTION INTERNAL FIXATION (ORIF) DISTAL FEMUR FRACTURE (FRACTURE AROUND FEMORAL NAIL);  Surgeon: Altamese [place], MD;  Location: [facility];  Service: Orthopedics;  Laterality: Left;   TONSILLECTOMY     TUBAL LIGATION     VAGINAL HYSTERECTOMY  1972   WRIST SURGERY     rt wrist hardwear removal     Allergies  Allergen Reactions   Tape Rash and Other (See Comments)    Old paper tape caused a reaction, doesn't react to most other tapes       Family History   Problem Relation Age of Onset   Diabetes Mother    Heart failure Father    Heart failure Maternal Aunt      Social History Ms. Melin reports that she has been smoking cigarettes. She has a 12.50 pack-year smoking history. She has been exposed to tobacco smoke. She has never used smokeless tobacco. Ms. Padget reports no history of alcohol use.   Review of Systems CONSTITUTIONAL: No weight loss, fever, chills, weakness or fatigue.  HEENT: Eyes: No visual loss, blurred vision, double vision or yellow sclerae.No hearing loss, sneezing, congestion, runny nose or sore throat.  SKIN: No rash or itching.  CARDIOVASCULAR: per hpi RESPIRATORY: No shortness of breath, cough or sputum.  GASTROINTESTINAL: No anorexia, nausea, vomiting or diarrhea. No abdominal pain or blood.  GENITOURINARY: No burning on urination, no polyuria NEUROLOGICAL: No headache, dizziness, syncope, paralysis, ataxia, numbness or tingling in the extremities. No change in bowel or bladder control.  MUSCULOSKELETAL: No muscle, back pain, joint pain or stiffness.  LYMPHATICS: No enlarged nodes. No history of splenectomy.  PSYCHIATRIC: No history of depression or anxiety.  ENDOCRINOLOGIC: No reports of sweating, cold or heat intolerance. No polyuria or polydipsia.  Marland Kitchen   Physical Examination Today's Vitals   10/23/22 1019  BP: (!) 144/84  Pulse: 68  SpO2:  98%  Weight: 133 lb (60.3 kg)  Height: 5' 3"$  (1.6 m)   Body mass index is 23.56 kg/m.  Gen: resting comfortably, no acute distress HEENT: no scleral icterus, pupils equal round and reactive, no palptable cervical adenopathy,  CV: RRR, 3/6 systolic murmur, rusb Resp: Clear to auscultation bilaterally GI: abdomen is soft, non-tender, non-distended, normal bowel sounds, no hepatosplenomegaly MSK: extremities are warm, no edema.  Skin: warm, no rash Neuro:  no focal deficits Psych: appropriate affect   Diagnostic Studies   06/2018 echo Study Conclusions   -  Left ventricle: The cavity size was normal. Wall thickness was   increased in a pattern of moderate LVH. Systolic function was   vigorous. The estimated ejection fraction was in the range of 65%   to 70%. Features are consistent with a pseudonormal left   ventricular filling pattern, with concomitant abnormal relaxation   and increased filling pressure (grade 2 diastolic dysfunction). - Aortic valve: Mildly calcified annulus. There was moderate to   severe regurgitation. - Mitral valve: There was mild to moderate regurgitation. - Left atrium: The atrium was severely dilated.   Impressions:   - Technically difficult echo   No parasternal views.     LV systolic function   The aortic insufficiency is at least moderate - severe in   severity .     08/2018 carotid US Summary: Right Carotid: Velocities in the right ICA are consistent with a 1-39% stenosis.   Left Carotid: Velocities in the left ICA are consistent with a 1-39% stenosis,               calcific plaque may obscure higher velicity.   12/2019 echo 1. Left ventricular ejection fraction, by estimation, is 60 to 65%. The  left ventricle has normal function. The left ventricle has no regional  wall motion abnormalities. There is mild left ventricular hypertrophy.  Left ventricular diastolic parameters  are consistent with Grade II diastolic dysfunction (pseudonormalization).  Elevated left atrial pressure.   2. Right ventricular systolic function is normal. The right ventricular  size is normal. There is mildly elevated pulmonary artery systolic  pressure.   3. Left atrial size was severely dilated.   4. The mitral valve was not well visualized. Mild mitral valve  regurgitation. Moderate mitral stenosis.MV peak gradient, 13.4 mmHg. The  mean mitral valve gradient is 5.0 mmHg.   5. The aortic valve was not well visualized. Aortic valve regurgitation  is moderate. Moderate aortic valve stenosis.   6. The inferior vena cava  is normal in size with greater than 50%  respiratory variability, suggesting right atrial pressure of 3 mmHg.    02/2021 echo IMPRESSIONS     1. Left ventricular ejection fraction, by estimation, is 60 to 65%. The  left ventricle has normal function. The left ventricle has no regional  wall motion abnormalities. There is mild left ventricular hypertrophy.  Left ventricular diastolic parameters  are indeterminate.   2. Right ventricular systolic function is normal. The right ventricular  size is normal.   3. Left atrial size was severely dilated.   4. Right atrial size was moderately dilated.   5. Previous MV gradients on TTE 01/05/20 mean 5 peak 13.4 mmHg at a HR of  68 bpm Gradients similar today at higher HR of 100 bpm MS primarily from  MAC especially posterior annulus and subchordal area . The mitral valve is  degenerative. Trivial mitral  valve regurgitation. Moderate mitral stenosis. Severe mitral annular  calcification.   6. Gradients have increased since echo 01/05/20 Degree of AR similar . The  aortic valve is tricuspid. There is moderate calcification of the aortic  valve. Aortic valve regurgitation is moderate. Moderate to severe aortic  valve stenosis.   7. The inferior vena cava is dilated in size with <50% respiratory  variability, suggesting right atrial pressure of 15 mmHg.   Assessment and Plan  1. Afib/prior PSVT/acquired thrombophlia - no symptoms, continue current meds. Just meets weight and age criteria to continue eliquis at 63m bid dosing.     2.  CAD -no recent symptoms, continue current meds   3.Aortic stenosis/Mitral stenosis -paradoxical low flow low gradient mod to severe AS.  - no symptoms though sedentary due to chronic generalized weakness - consider repeat echo over time pending clinical course of her liver disease and heptaocellular carcinoma, I doubt ultimately would ever be a candidate for valve replacement.    4. Carotid stenosis - repeat [place] later this year pending overall clinical course.   5. Hyperlipidemia - at goal, continue current meds  6. HTN - elevated here but has not taken meds, has been at goal      JArnoldo Lenis M.D.

## 2022-10-23 NOTE — Patient Instructions (Signed)
Medication Instructions:  Your physician recommends that you continue on your current medications as directed. Please refer to the Current Medication list given to you today.   Labwork: None today  Testing/Procedures: None today  Follow-Up: 6 months  Any Other Special Instructions Will Be Listed Below (If Applicable).  If you need a refill on your cardiac medications before your next appointment, please call your pharmacy.  

## 2022-11-03 ENCOUNTER — Ambulatory Visit (INDEPENDENT_AMBULATORY_CARE_PROVIDER_SITE_OTHER): Payer: Medicare Other | Admitting: Gastroenterology

## 2022-11-03 ENCOUNTER — Encounter (INDEPENDENT_AMBULATORY_CARE_PROVIDER_SITE_OTHER): Payer: Self-pay | Admitting: Gastroenterology

## 2022-11-03 ENCOUNTER — Ambulatory Visit
Admission: RE | Admit: 2022-11-03 | Discharge: 2022-11-03 | Disposition: A | Discharge status: Home / Self Care | Payer: Medicare Other | Source: Ambulatory Visit | Attending: Interventional Radiology | Admitting: Interventional Radiology

## 2022-11-03 VITALS — BP 140/86 | HR 89 | Temp 97.1°F | Ht 63.0 in | Wt 130.3 lb

## 2022-11-03 DIAGNOSIS — K7581 Nonalcoholic steatohepatitis (NASH): Secondary | ICD-10-CM

## 2022-11-03 DIAGNOSIS — K7460 Unspecified cirrhosis of liver: Secondary | ICD-10-CM

## 2022-11-03 DIAGNOSIS — K7682 Hepatic encephalopathy: Secondary | ICD-10-CM

## 2022-11-03 DIAGNOSIS — C220 Liver cell carcinoma: Secondary | ICD-10-CM

## 2022-11-03 DIAGNOSIS — C801 Malignant (primary) neoplasm, unspecified: Secondary | ICD-10-CM | POA: Diagnosis not present

## 2022-11-03 DIAGNOSIS — I8510 Secondary esophageal varices without bleeding: Secondary | ICD-10-CM | POA: Diagnosis not present

## 2022-11-03 DIAGNOSIS — C787 Secondary malignant neoplasm of liver and intrahepatic bile duct: Secondary | ICD-10-CM | POA: Diagnosis not present

## 2022-11-03 HISTORY — PX: IR RADIOLOGIST EVAL & MGMT: IMG5224

## 2022-11-03 NOTE — Patient Instructions (Addendum)
Follow up with interventional radiology today - discuss timing for repeat imaging Follow with Dr. Delton Coombes regarding cancer Titrate lactulose to achieve 2-3 bowel movements per day - Reduce salt intake to <2 g per day - Can take Tylenol max of 2 g per day (650 mg q8h) for pain - Avoid NSAIDs for pain - Avoid eating raw oysters/shellfish - Protein shake (Ensure or Boost) every night before going to sleep Repeat EGD in 08/2024

## 2022-11-03 NOTE — Progress Notes (Signed)
Referring Physician(s): Jenetta Downer Mayorga,D/Katragadda,S   Reason for follow up: The patient is seen in follow up today via virtual telephone clinic visit s/p segment II radiation segmentectomy on 10/10/22  History of present illness: Initial HPI from 09/10/22:  [name] is a 82 y.o. female with history of recent incidentally discovered and now biopsy proven left lobe unifocal hepatocellular carcinoma.  She presents today via virtual telephone visit accompanied by her grand daughter, [name] [id number]).     [name] was admitted to the hospital in August 2023 for sepsis/UTI at which time a CT was performed which demonstrated cirrhosis and a concerning 4.5 cm left lobe liver mass. This prompted further evaluation with MRI which demonstrated a 4.8 cm segment II LR-M mass.  Ultrasound guided biopsy was performed on 08/15/22 which confirmed hepatocellular carcinoma.     She has been doing well overall.  No abdominal pain, nausea, vomiting, fevers, chills, chest pain, shortness of breath, jaundice.  She has intermittently poor appetite.    She underwent Tc-MAA mapping study on 09/26/22 which required celiac artery stent placement for catheterization of the hepatic artery.  This was followed by transarterial radioembolization to the segment II branches supplying the biopsy proven [place] on 10/10/22.  The procedure went as planned without complication.  She has recovered very well.  She continues to have an intermittently poor appetite, without significant change from pre-embolization.  She again denies abdominal pain, nausea, vomiting, fevers, chills, chest pain, shortness of breath, or jaundice.  She saw Dr. Jenetta Downer earlier today.l  Past Medical History:  Diagnosis Date   Anemia    Anxiety    Aortic insufficiency    Moderate   Atrial fibrillation ([place])    Back pain    Carotid stenosis, bilateral    Cirrhosis ([place])    Closed left femoral fracture ([place]) 04/21/2017   Coronary artery  disease    Stent x 2 RCA 1995, cardiac catheterization 09/2016 showing only mild atherosclerosis   DDD (degenerative disc disease), lumbar    Depression    Diabetes mellitus without complication ([place])    Essential hypertension    Falls    GERD (gastroesophageal reflux disease)    Hiatal hernia    History of stroke    Hypercholesteremia    IBS (irritable bowel syndrome)    Non-alcoholic fatty liver disease    OSA (obstructive sleep apnea)    no CPAP   Pericardial effusion    a. small by echo 2018.   PSVT (paroxysmal supraventricular tachycardia)    Recurrent UTI    Seizures ([place]) 2018   Stroke ([place]) 07/2019   several strokes within the last 10 years   Swallowing difficulty    Varices, esophageal ([place])     Past Surgical History:  Procedure Laterality Date   [place]  [phone]   Stent to the proximal RCA after MI    CARDIAC CATHETERIZATION N/A 09/26/2016   Procedure: Left Heart Cath and Coronary Angiography;  Surgeon: Leonie Man, MD;  Location: [facility];  Service: Cardiovascular;  Laterality: N/A;   CATARACT EXTRACTION W/PHACO Right 07/04/2014   Procedure: CATARACT EXTRACTION PHACO AND INTRAOCULAR LENS PLACEMENT (IOC);  Surgeon: Elta Guadeloupe T. Gershon Crane, MD;  Location: [facility];  Service: Ophthalmology;  Laterality: Right;  CDE:13.13   COLONOSCOPY     DILATION AND CURETTAGE OF UTERUS     x2  Epi Retinal Membrane Peel Left    ERCP     ESOPHAGEAL BANDING N/A 04/01/2013   Procedure: ESOPHAGEAL BANDING;  Surgeon: Rogene Houston, MD;  [facility];  Service: Endoscopy;  Laterality: N/A;   ESOPHAGEAL BANDING N/A 05/24/2013   Procedure: ESOPHAGEAL BANDING;  Surgeon: Rogene Houston, MD;  [facility];  Service: Endoscopy;  Laterality: N/A;   ESOPHAGEAL BANDING N/A 06/21/2014   Procedure: ESOPHAGEAL BANDING;  Surgeon: Rogene Houston, MD;  [facility];   Service: Endoscopy;  Laterality: N/A;   ESOPHAGEAL BANDING  06/15/2020   Procedure: ESOPHAGEAL BANDING;  Surgeon: Harvel Quale, MD;  [facility];  Service: Gastroenterology;;   ESOPHAGOGASTRODUODENOSCOPY N/A 04/01/2013   Procedure: ESOPHAGOGASTRODUODENOSCOPY (EGD);  Surgeon: Rogene Houston, MD;  [facility];  Service: Endoscopy;  Laterality: N/A;  230-rescheduled to 8:30am Ann notified pt   ESOPHAGOGASTRODUODENOSCOPY N/A 05/24/2013   Procedure: ESOPHAGOGASTRODUODENOSCOPY (EGD);  Surgeon: Rogene Houston, MD;  [facility];  Service: Endoscopy;  Laterality: N/A;  730   ESOPHAGOGASTRODUODENOSCOPY N/A 06/21/2014   Procedure: ESOPHAGOGASTRODUODENOSCOPY (EGD);  Surgeon: Rogene Houston, MD;  [facility];  Service: Endoscopy;  Laterality: N/A;  930-rescheduled 10/14 @ 1200 Ann to notify pt   ESOPHAGOGASTRODUODENOSCOPY (EGD) WITH PROPOFOL N/A 06/15/2020   Procedure: ESOPHAGOGASTRODUODENOSCOPY (EGD) WITH PROPOFOL;  Surgeon: Harvel Quale, MD;  [facility];  Service: Gastroenterology;  Laterality: N/A;  230   ESOPHAGOGASTRODUODENOSCOPY (EGD) WITH PROPOFOL N/A 07/27/2020   Procedure: ESOPHAGOGASTRODUODENOSCOPY (EGD) WITH PROPOFOL;  Surgeon: Harvel Quale, MD;  [facility];  Service: Gastroenterology;  Laterality: N/A;  1245   ESOPHAGOGASTRODUODENOSCOPY (EGD) WITH PROPOFOL N/A 08/19/2022   Procedure: ESOPHAGOGASTRODUODENOSCOPY (EGD) WITH PROPOFOL;  Surgeon: Harvel Quale, MD;  [facility];  Service: Gastroenterology;  Laterality: N/A;  130 ASA 3, pt knows to arrive at [place]   cataract surgery of the left eye   FEMUR IM NAIL Left 03/02/2017   Procedure: INTRAMEDULLARY (IM) NAIL FEMORAL;  Surgeon: Rod Can, MD;  Location: [facility];  Service: Orthopedics;  Laterality: Left;   HARDWARE REMOVAL Right 01/17/2013   Procedure: REMOVAL OF HARDWARE AND EXCISION ULNAR  STYLOID RIGHT WRIST;  Surgeon: Tennis Must, MD;  Location: [facility];  Service: Orthopedics;  Laterality: Right;   implanted heart monitor  02/2020   to monitor Afib   IR 3D INDEPENDENT WKST  09/26/2022   IR ANGIOGRAM SELECTIVE EACH ADDITIONAL VESSEL  09/26/2022   IR ANGIOGRAM SELECTIVE EACH ADDITIONAL VESSEL  09/26/2022   IR ANGIOGRAM SELECTIVE EACH ADDITIONAL VESSEL  09/26/2022   IR ANGIOGRAM SELECTIVE EACH ADDITIONAL VESSEL  09/26/2022   IR ANGIOGRAM SELECTIVE EACH ADDITIONAL VESSEL  09/26/2022   IR ANGIOGRAM SELECTIVE EACH ADDITIONAL VESSEL  10/10/2022   IR ANGIOGRAM SELECTIVE EACH ADDITIONAL VESSEL  10/10/2022   IR ANGIOGRAM SELECTIVE EACH ADDITIONAL VESSEL  10/10/2022   IR ANGIOGRAM VISCERAL SELECTIVE  09/26/2022   IR ANGIOGRAM VISCERAL SELECTIVE  10/10/2022   IR EMBO TUMOR ORGAN ISCHEMIA INFARCT INC GUIDE ROADMAPPING  10/10/2022   IR INTRAVASCULAR ULTRASOUND NON CORONARY  09/26/2022   IR RADIOLOGIST EVAL & MGMT  09/10/2022   IR TRANSCATH PLC STENT 1ST ART NOT LE CV CAR VERT CAR  09/26/2022   IR US GUIDE VASC ACCESS RIGHT  09/26/2022   IR US GUIDE VASC ACCESS RIGHT  10/10/2022   MYRINGOTOMY  2012  both ears   ORIF FEMUR FRACTURE Left 04/23/2017   Procedure: OPEN REDUCTION INTERNAL FIXATION (ORIF) DISTAL FEMUR FRACTURE (FRACTURE AROUND FEMORAL NAIL);  Surgeon: Altamese North Merrick, MD;  Location: [facility];  Service: Orthopedics;  Laterality: Left;   TONSILLECTOMY     TUBAL LIGATION     VAGINAL HYSTERECTOMY  1972   WRIST SURGERY     rt wrist hardwear removal    Allergies: Tape  Medications: Prior to Admission medications   Medication Sig Start Date End Date Taking? Authorizing Provider  acetaminophen (TYLENOL) 500 MG tablet Take 1,000 mg by mouth at bedtime.    [provider]  apixaban (ELIQUIS) 5 MG TABS tablet TAKE (1) TABLET BY MOUTH TWICE DAILY. 10/17/22   Arnoldo Lenis, MD  aspirin EC 81 MG tablet Take 1 tablet (81 mg total) by mouth daily. Swallow whole. 09/26/22    Timea Breed, Rosanne Ashing, MD  atorvastatin (LIPITOR) 80 MG tablet Take 1 tablet (80 mg total) by mouth daily. Patient taking differently: Take 80 mg by mouth at bedtime. 07/15/18   Angiulli, Lavon Paganini, PA-C  azelastine (ASTELIN) 0.1 % nasal spray Place 1 spray into both nostrils at bedtime. 11/06/20   [provider]  CALCIUM CITRATE PO Take 200 mg by mouth daily.    [provider]  carvedilol (COREG) 6.25 MG tablet TAKE (1) TABLET BY MOUTH TWICE DAILY WITH A MEAL. 07/30/22   Carlan, Deatra Robinson, NP  Cholecalciferol (VITAMIN D3) 50 MCG (2000 UT) TABS Take 2,000 Units by mouth daily.    [provider]  Coenzyme Q10 (CO Q-10) 100 MG CAPS Take 100 mg by mouth in the morning.    [provider]  diltiazem (CARDIZEM CD) 180 MG 24 hr capsule Take 180 mg by mouth daily. 05/13/22   [provider]  doxepin (SINEQUAN) 25 MG capsule Take 25 mg by mouth at bedtime. 06/24/22   [provider]  doxylamine, Sleep, (UNISOM) 25 MG tablet Take 1 tablet (25 mg total) by mouth at bedtime as needed. Patient taking differently: Take 25 mg by mouth at bedtime. 04/14/22   Barton Dubois, MD  furosemide (LASIX) 40 MG tablet Take 0.5 tablets (20 mg total) by mouth daily. TAKE (1) TABLET BY MOUTH ONCE DAILY. MAY TAKE ADDITIONAL HALF IF NEEDED FOR SEVERE SWELLING. Strength: 20 mg Patient taking differently: Take 20-40 mg by mouth See admin instructions. Alternate taking 20 mg one day and 40 mg the next 04/14/22   Barton Dubois, MD  lactulose (CHRONULAC) 10 GM/15ML solution Take 30 mLs (20 g total) by mouth 2 (two) times daily. 08/19/22 11/17/22  Harvel Quale, MD  loperamide (IMODIUM) 2 MG capsule Take 2 mg by mouth as needed for diarrhea or loose stools.    [provider]  Multiple Vitamin (MULTIVITAMIN WITH MINERALS) TABS tablet Take 1 tablet by mouth daily.    [provider]  nitroGLYCERIN (NITROSTAT) 0.4 MG SL tablet Place 0.4 mg under the tongue every 5  (five) minutes as needed for chest pain. 08/22/20   [provider]  omeprazole (PRILOSEC) 40 MG capsule Take 1 capsule (40 mg total) by mouth daily. 12/30/21   Carlan, Chelsea L, NP  ondansetron (ZOFRAN-ODT) 4 MG disintegrating tablet Take 4 mg by mouth every 8 (eight) hours as needed for nausea or vomiting. Patient not taking: Reported on 08/21/2022    [provider]  OVER THE COUNTER MEDICATION CBD oil tincture 2.1 mg as needed up to three times daily for appetite.  [provider]  potassium chloride SA (KLOR-CON M) 20 MEQ tablet Take 20 mEq by mouth 2 (two) times daily. 04/19/22   [provider]     Family History  Problem Relation Age of Onset   Diabetes Mother    Heart failure Father    Heart failure Maternal Aunt     Social History   Socioeconomic History   Marital status: Widowed    Spouse name: Not on file   Number of children: 2   Years of education: 2 years college   Highest education level: Some college, no degree  Occupational History    Employer: RETIRED  Tobacco Use   Smoking status: Former    Packs/day: 0.25    Years: 50.00    Total pack years: 12.50    Types: Cigarettes    Quit date: 07/09/2022    Years since quitting: 0.3    Passive exposure: Current   Smokeless tobacco: Never   Tobacco comments:    12/23/21 1 pack Q two weeks.  Vaping Use   Vaping Use: Never used  Substance and Sexual Activity   Alcohol use: No    Alcohol/week: 0.0 standard drinks of alcohol   Drug use: No   Sexual activity: Not Currently    Birth control/protection: Surgical  Other Topics Concern   Not on file  Social History Narrative   12/23/21  Lives with 3 grandchildren, sone and dgtr-in -law are across the st   Social Determinants of Health   Financial Resource Strain: Low Risk  (09/15/2022)   Overall Financial Resource Strain (CARDIA)    Difficulty of Paying Living Expenses: Not hard at all  Food Insecurity: No Food Insecurity  (09/26/2021)   Hunger Vital Sign    Worried About Running Out of Food in the Last Year: Never true    [place] in the Last Year: Never true  Transportation Needs: No Transportation Needs (09/15/2022)   PRAPARE - Hydrologist (Medical): No    Lack of Transportation (Non-Medical): No  Physical Activity: Inactive (03/16/2019)   Exercise Vital Sign    Days of Exercise per Week: 0 days    Minutes of Exercise per Session: 0 min  Stress: No Stress Concern Present (03/16/2019)   [place]    Feeling of Stress : Not at all  Social Connections: Moderately Isolated (03/16/2019)   Social Connection and Isolation Panel [NHANES]    Frequency of Communication with Friends and Family: More than three times a week    Frequency of Social Gatherings with Friends and Family: Never    Attends Religious Services: 1 to 4 times per year    Active Member of [employer] or Organizations: No    Attends Archivist Meetings: Never    Marital Status: Widowed     Vital Signs: There were no vitals taken for this visit.  No physical examination was performed in lieu of virtual telephone clinic visit.   Imaging: MR Abdomen 07/24/22   [facility] beam CT from hepatic angiogram 09/26/22   Labs:  CBC: Recent Labs    04/14/22 0446 08/14/22 1028 09/26/22 0756 10/10/22 0832  WBC 10.9* 6.1 6.3 6.5  HGB 10.9* 13.0 13.6 12.4  HCT 32.4* 39.2 41.8 38.5  PLT 85* 143* 148* 136*    COAGS: Recent Labs    12/05/21 2300 04/12/22 1300 08/14/22 1028 09/26/22 0756 10/10/22 0832  INR 1.8* 2.2* 1.3* 1.3*  1.6*  APTT 35  --   --  33  --     BMP: Recent Labs    04/14/22 0446 08/14/22 1028 09/26/22 0756 10/10/22 0832  NA 138 137 138 138  K 3.9 3.7 4.2 4.9  CL 112* 107 104 109  CO2 '22 23 25 '$ 19*  GLUCOSE 113* 111* 148* 132*  BUN '22 15 14 13  '$ CALCIUM 8.3* 9.2 9.3 9.1  CREATININE 0.98 0.98 1.33* 1.02*  GFRNONAA  59* 58* 40* 56*    LIVER FUNCTION TESTS: Recent Labs    04/14/22 0446 08/14/22 1028 09/26/22 0756 10/10/22 0832  BILITOT 1.7* 1.5* 1.2 1.6*  AST 75* 53* 62* 44*  ALT 28 36 46* 24  ALKPHOS 74 153* 149* 132*  PROT 5.2* 6.2* 6.7 6.2*  ALBUMIN 2.3* 3.0* 3.2* 3.0*    Assessment and Plan: "Barbara" [name] is an 82 year old female with NASH cirrhosis (Child Pugh B, ALBI 2) and biopsy proven unifocal left lobe hepatocellular carcinoma, possibly scirrhous subtype. She is now status post Tc-MAA mapping study with celiac artery stent placement on 09/26/22 followed by radiation segmentectomy to segment II mass on 10/10/22.    She has recovered very well since the procedure without any significant symptoms or complaints.  Plan for MRI abdomen with contrast in early May with clinic follow up shortly thereafter.   Electronically Signed: Suzette Battiest 11/03/2022, 10:07 AM   I spent a total of 25 Minutes in virtual telephone clinical consultation, greater than 50% of which was counseling/coordinating care for hepatocellular carcinoma.

## 2022-11-03 NOTE — Progress Notes (Signed)
[name], M.D. Gastroenterology & Hepatology [facility] [address] [place], [place] [address]  Primary Care Physician: [employer], [place] [id number]  I will communicate my assessment and recommendations to the referring MD via EMR.  Problems: NASH cirrhosis Well-differentiated HCC s/p Y90 radiation segmentectomy G1 EV Grade 1 hepatic encephalopathy  History of Present Illness: [name] is a 82 y.o. female with past medical history of Karlene Lineman cirrhosis complicated by esophageal varices status post banding, grade 1 hepatic encephalopathy and HCC, CVA, A-fib, anxiety, coronary artery disease status post stent placement, depression, IBS, seizures and possible dementia, who presents for follow up of cirrhosis and HCC.  The patient was last seen on 07/01/22. At that time, the patient was ordered to undergo MRI to evaluate her liver lesion further.  AFP was checked again and it was 203.  MRI of the liver with and without IV contrast showed LR-M lesion in the left hepatic lobe measuring 4.8 x 2.5 cm.  No other lesions were observed.  Liver biopsy was performed on 08/15/2022 which showed a well-differentiated HCC.  Due to this, the patient was referred for evaluation by oncology.  Underwent Y90 radiation segmentectomy by Dr. Serafina Royals on 10/10/2021.  States she tolerated this procedure adequately without any complaints.  Esophagogastroduodenospy was performed on 08/19/2022 which showed presence of grade 1 esophageal varices, 3 cm hiatal hernia, normal stomach and duodenum.  Granddaughter states she has been forgetful for years, but no disoriented.  Cirrhosis related questions: Hematemesis/coffee ground emesis: No Abdominal pain: No Abdominal distention/worsening ascitesNo Fever/chills: No Episodes of confusion/disorientation: Is forgetful sometimes, chronically Number of daily bowel movements:3-4, takes  lactulose 2 times a day Taking diuretics?: Yes, furosemide 20 mg/day History of variceal bleeding: No Prior history of banding?: Yes Prior episodes of SBP: No Last time liver imaging was performed:MRI - 07/24/2022 - LR-M lesion in the left hepatic lobe measuring 4.8 x 2.5 cm.  Last AFP: 07/01/22 - 203 MELD score: 10/10/22  - 16  The patient states feeling well and denies having complaints.  Denies having any nausea, vomiting, fever, chills, hematochezia, melena, hematemesis, abdominal distention, abdominal pain, diarrhea, jaundice, pruritus or weight loss.  Last Colonoscopy:>10 years ago, does not wish to repeat colonoscopy  Last Endoscopy: As above  Past Medical History: Past Medical History:  Diagnosis Date   Anemia    Anxiety    Aortic insufficiency    Moderate   Atrial fibrillation (HCC)    Back pain    Carotid stenosis, bilateral    Cirrhosis (HCC)    Closed left femoral fracture (HCC) 04/21/2017   Coronary artery disease    Stent x 2 RCA 1995, cardiac catheterization 09/2016 showing only mild atherosclerosis   DDD (degenerative disc disease), lumbar    Depression    Diabetes mellitus without complication ([place])    Essential hypertension    Falls    GERD (gastroesophageal reflux disease)    Hiatal hernia    History of stroke    Hypercholesteremia    IBS (irritable bowel syndrome)    Non-alcoholic fatty liver disease    OSA (obstructive sleep apnea)    no CPAP   Pericardial effusion    a. small by echo 2018.   PSVT (paroxysmal supraventricular tachycardia)    Recurrent UTI    Seizures ([place]) 2018   Stroke ([place]) 07/2019   several strokes within the last 10 years   Swallowing difficulty    Varices, esophageal (  [place])     Past Surgical History: Past Surgical History:  Procedure Laterality Date   [place]  [phone]   Stent to the proximal RCA after MI    CARDIAC CATHETERIZATION  N/A 09/26/2016   Procedure: Left Heart Cath and Coronary Angiography;  Surgeon: Leonie Man, MD;  Location: [facility];  Service: Cardiovascular;  Laterality: N/A;   CATARACT EXTRACTION W/PHACO Right 07/04/2014   Procedure: CATARACT EXTRACTION PHACO AND INTRAOCULAR LENS PLACEMENT (IOC);  Surgeon: Elta Guadeloupe T. Gershon Crane, MD;  Location: [facility];  Service: Ophthalmology;  Laterality: Right;  CDE:13.13   COLONOSCOPY     DILATION AND CURETTAGE OF UTERUS     x2   Epi Retinal Membrane Peel Left    ERCP     ESOPHAGEAL BANDING N/A 04/01/2013   Procedure: ESOPHAGEAL BANDING;  Surgeon: Rogene Houston, MD;  [facility];  Service: Endoscopy;  Laterality: N/A;   ESOPHAGEAL BANDING N/A 05/24/2013   Procedure: ESOPHAGEAL BANDING;  Surgeon: Rogene Houston, MD;  [facility];  Service: Endoscopy;  Laterality: N/A;   ESOPHAGEAL BANDING N/A 06/21/2014   Procedure: ESOPHAGEAL BANDING;  Surgeon: Rogene Houston, MD;  [facility];  Service: Endoscopy;  Laterality: N/A;   ESOPHAGEAL BANDING  06/15/2020   Procedure: ESOPHAGEAL BANDING;  Surgeon: Harvel Quale, MD;  [facility];  Service: Gastroenterology;;   ESOPHAGOGASTRODUODENOSCOPY N/A 04/01/2013   Procedure: ESOPHAGOGASTRODUODENOSCOPY (EGD);  Surgeon: Rogene Houston, MD;  [facility];  Service: Endoscopy;  Laterality: N/A;  230-rescheduled to 8:30am Ann notified pt   ESOPHAGOGASTRODUODENOSCOPY N/A 05/24/2013   Procedure: ESOPHAGOGASTRODUODENOSCOPY (EGD);  Surgeon: Rogene Houston, MD;  [facility];  Service: Endoscopy;  Laterality: N/A;  730   ESOPHAGOGASTRODUODENOSCOPY N/A 06/21/2014   Procedure: ESOPHAGOGASTRODUODENOSCOPY (EGD);  Surgeon: Rogene Houston, MD;  [facility];  Service: Endoscopy;  Laterality: N/A;  930-rescheduled 10/14 @ 1200 Ann to notify pt   ESOPHAGOGASTRODUODENOSCOPY (EGD) WITH PROPOFOL N/A 06/15/2020   Procedure: ESOPHAGOGASTRODUODENOSCOPY (EGD)  WITH PROPOFOL;  Surgeon: Harvel Quale, MD;  [facility];  Service: Gastroenterology;  Laterality: N/A;  230   ESOPHAGOGASTRODUODENOSCOPY (EGD) WITH PROPOFOL N/A 07/27/2020   Procedure: ESOPHAGOGASTRODUODENOSCOPY (EGD) WITH PROPOFOL;  Surgeon: Harvel Quale, MD;  [facility];  Service: Gastroenterology;  Laterality: N/A;  1245   ESOPHAGOGASTRODUODENOSCOPY (EGD) WITH PROPOFOL N/A 08/19/2022   Procedure: ESOPHAGOGASTRODUODENOSCOPY (EGD) WITH PROPOFOL;  Surgeon: Harvel Quale, MD;  [facility];  Service: Gastroenterology;  Laterality: N/A;  130 ASA 3, pt knows to arrive at [facility]   cataract surgery of the left eye   FEMUR IM NAIL Left 03/02/2017   Procedure: INTRAMEDULLARY (IM) NAIL FEMORAL;  Surgeon: Rod Can, MD;  Location: [facility];  Service: Orthopedics;  Laterality: Left;   HARDWARE REMOVAL Right 01/17/2013   Procedure: REMOVAL OF HARDWARE AND EXCISION ULNAR STYLOID RIGHT WRIST;  Surgeon: Tennis Must, MD;  Location: [facility];  Service: Orthopedics;  Laterality: Right;   implanted heart monitor  02/2020   to monitor Afib   IR 3D INDEPENDENT WKST  09/26/2022   IR ANGIOGRAM SELECTIVE EACH ADDITIONAL VESSEL  09/26/2022   IR ANGIOGRAM SELECTIVE EACH ADDITIONAL VESSEL  09/26/2022   IR ANGIOGRAM SELECTIVE EACH ADDITIONAL VESSEL  09/26/2022   IR  ANGIOGRAM SELECTIVE EACH ADDITIONAL VESSEL  09/26/2022   IR ANGIOGRAM SELECTIVE EACH ADDITIONAL VESSEL  09/26/2022   IR ANGIOGRAM SELECTIVE EACH ADDITIONAL VESSEL  10/10/2022   IR ANGIOGRAM SELECTIVE EACH ADDITIONAL VESSEL  10/10/2022   IR ANGIOGRAM SELECTIVE EACH ADDITIONAL VESSEL  10/10/2022   IR ANGIOGRAM VISCERAL SELECTIVE  09/26/2022   IR ANGIOGRAM VISCERAL SELECTIVE  10/10/2022   IR EMBO TUMOR ORGAN ISCHEMIA INFARCT INC GUIDE ROADMAPPING  10/10/2022   IR INTRAVASCULAR ULTRASOUND NON CORONARY  09/26/2022   IR RADIOLOGIST EVAL & MGMT  09/10/2022   IR TRANSCATH  PLC STENT 1ST ART NOT LE CV CAR VERT CAR  09/26/2022   IR US GUIDE VASC ACCESS RIGHT  09/26/2022   IR US GUIDE VASC ACCESS RIGHT  10/10/2022   MYRINGOTOMY  2012   both ears   ORIF FEMUR FRACTURE Left 04/23/2017   Procedure: OPEN REDUCTION INTERNAL FIXATION (ORIF) DISTAL FEMUR FRACTURE (FRACTURE AROUND FEMORAL NAIL);  Surgeon: Altamese Suissevale, MD;  Location: [facility];  Service: Orthopedics;  Laterality: Left;   TONSILLECTOMY     TUBAL LIGATION     VAGINAL HYSTERECTOMY  1972   WRIST SURGERY     rt wrist hardwear removal    Family History: Family History  Problem Relation Age of Onset   Diabetes Mother    Heart failure Father    Heart failure Maternal Aunt     Social History: Social History   Tobacco Use  Smoking Status Former   Packs/day: 0.25   Years: 50.00   Total pack years: 12.50   Types: Cigarettes   Quit date: 07/09/2022   Years since quitting: 0.3   Passive exposure: Current  Smokeless Tobacco Never  Tobacco Comments   12/23/21 1 pack Q two weeks.   Social History   Substance and Sexual Activity  Alcohol Use No   Alcohol/week: 0.0 standard drinks of alcohol   Social History   Substance and Sexual Activity  Drug Use No    Allergies: Allergies  Allergen Reactions   Tape Rash and Other (See Comments)    Old paper tape caused a reaction, doesn't react to most other tapes     Medications: Current Outpatient Medications  Medication Sig Dispense Refill   acetaminophen (TYLENOL) 500 MG tablet Take 1,000 mg by mouth at bedtime.     apixaban (ELIQUIS) 5 MG TABS tablet TAKE (1) TABLET BY MOUTH TWICE DAILY. 60 tablet 2   aspirin EC 81 MG tablet Take 1 tablet (81 mg total) by mouth daily. Swallow whole. 30 tablet 12   atorvastatin (LIPITOR) 80 MG tablet Take 1 tablet (80 mg total) by mouth daily. (Patient taking differently: Take 80 mg by mouth at bedtime.) 30 tablet 1   azelastine (ASTELIN) 0.1 % nasal spray Place 1 spray into both nostrils at bedtime.     CALCIUM  CITRATE PO Take 200 mg by mouth daily.     carvedilol (COREG) 6.25 MG tablet TAKE (1) TABLET BY MOUTH TWICE DAILY WITH A MEAL. 180 tablet 0   Cholecalciferol (VITAMIN D3) 50 MCG (2000 UT) TABS Take 2,000 Units by mouth daily.     Coenzyme Q10 (CO Q-10) 100 MG CAPS Take 100 mg by mouth in the morning.     diltiazem (CARDIZEM CD) 180 MG 24 hr capsule Take 180 mg by mouth daily.     furosemide (LASIX) 40 MG tablet Take 0.5 tablets (20 mg total) by mouth daily. TAKE (1) TABLET BY MOUTH ONCE DAILY. MAY TAKE ADDITIONAL HALF IF  NEEDED FOR SEVERE SWELLING. Strength: 20 mg (Patient taking differently: Take 20-40 mg by mouth See admin instructions. Alternate taking 20 mg one day and 40 mg the next) 45 tablet 0   lactulose (CHRONULAC) 10 GM/15ML solution Take 30 mLs (20 g total) by mouth 2 (two) times daily. 5400 mL 1   loperamide (IMODIUM) 2 MG capsule Take 2 mg by mouth as needed for diarrhea or loose stools.     Multiple Vitamin (MULTIVITAMIN WITH MINERALS) TABS tablet Take 1 tablet by mouth daily.     nitroGLYCERIN (NITROSTAT) 0.4 MG SL tablet Place 0.4 mg under the tongue every 5 (five) minutes as needed for chest pain.     omeprazole (PRILOSEC) 40 MG capsule Take 1 capsule (40 mg total) by mouth daily. 90 capsule 3   ondansetron (ZOFRAN-ODT) 4 MG disintegrating tablet Take 4 mg by mouth every 8 (eight) hours as needed for nausea or vomiting.     OVER THE COUNTER MEDICATION CBD oil tincture 2.1 mg as needed up to three times daily for appetite.     potassium chloride SA (KLOR-CON M) 20 MEQ tablet Take 20 mEq by mouth 2 (two) times daily.     doxepin (SINEQUAN) 25 MG capsule Take 25 mg by mouth at bedtime. (Patient not taking: Reported on 11/03/2022)     No current facility-administered medications for this visit.   Facility-Administered Medications Ordered in Other Visits  Medication Dose Route Frequency Provider Last Rate Last Admin   dexamethasone (DECADRON) injection 8 mg  8 mg Intravenous Once  Boisseau, Hayley, PA        Review of Systems: GENERAL: negative for malaise, night sweats HEENT: No changes in hearing or vision, no nose bleeds or other nasal problems. NECK: Negative for lumps, goiter, pain and significant neck swelling RESPIRATORY: Negative for cough, wheezing CARDIOVASCULAR: Negative for chest pain, leg swelling, palpitations, orthopnea GI: SEE HPI MUSCULOSKELETAL: Negative for joint pain or swelling, back pain, and muscle pain. SKIN: Negative for lesions, rash PSYCH: Negative for sleep disturbance, mood disorder and recent psychosocial stressors. HEMATOLOGY Negative for prolonged bleeding, bruising easily, and swollen nodes. ENDOCRINE: Negative for cold or heat intolerance, polyuria, polydipsia and goiter. NEURO: negative for tremor, gait imbalance, syncope and seizures. The remainder of the review of systems is noncontributory.   Physical Exam: BP (!) 140/86 (BP Location: Left Arm, Patient Position: Sitting, Cuff Size: Normal)   Pulse 89   Temp (!) 97.1 F (36.2 C) (Temporal)   Ht '5\' 3"'$  (1.6 m)   Wt 130 lb 4.8 oz (59.1 kg)   BMI 23.08 kg/m  GENERAL: The patient is AO x3, in no acute distress. Sitting in wheelchair HEENT: Head is normocephalic and atraumatic. EOMI are intact. Mouth is well hydrated and without lesions. NECK: Supple. No masses LUNGS: Clear to auscultation. No presence of rhonchi/wheezing/rales. Adequate chest expansion HEART: RRR, normal s1 and s2. ABDOMEN: Soft, nontender, no guarding, no peritoneal signs, and nondistended. BS +. No masses. EXTREMITIES: Without any cyanosis, clubbing, rash, lesions or edema. No asterixis. NEUROLOGIC: AOx3, no focal motor deficit. SKIN: no jaundice, no rashes  Imaging/Labs: as above  I personally reviewed and interpreted the available labs, imaging and endoscopic files.  Impression and Plan: Vickye Golen Salone is a 82 y.o. female with past medical history of Karlene Lineman cirrhosis complicated by esophageal  varices status post banding, grade 1 hepatic encephalopathy and HCC, CVA, A-fib, anxiety, coronary artery disease status post stent placement, depression, IBS, seizures and possible dementia, who presents for  follow up of cirrhosis and HCC.  Patient has presented decompensating events with past as she had new onset of [place] is currently receiving treatment with Y90.  Has not presented any episodes of upper gastrointestinal bleeding or ascites.  In the past there was some concern about possible hepatic encephalopathy which is currently being treated with lactulose.  She is still presenting some disorientation to time but no presence of asterixis, I suspect he may have some other etiology leading to her disorientation such as possible vascular dementia.  Will continue her on the same doses of lactulose to achieve 2-3 bowel movements per day.  I discussed with the daughter-in-law and granddaughter the importance of following up with interventional radiology to discuss the surveilled this imaging she needs for her [facility] and to discuss the other potential treatments.  She will also need to follow-up with Dr. Delton Coombes as scheduled.  -Follow up with interventional radiology today - discuss timing for repeat imaging -Follow with Dr. Delton Coombes regarding cancer -Titrate lactulose to achieve 2-3 bowel movements per day -Continue furosemide 40 mg every day - Reduce salt intake to <2 g per day - Can take Tylenol max of 2 g per day (650 mg q8h) for pain - Avoid NSAIDs for pain - Avoid eating raw oysters/shellfish - Protein shake (Ensure or Boost) every night before going to sleep -Repeat EGD in 08/2024 - RTC 6 months  All questions were answered.      Barbara Peppers, MD Gastroenterology and Hepatology [facility]

## 2022-11-06 NOTE — Addendum Note (Signed)
Encounter addended by: Logan Bores on: 11/06/2022 8:28 AM  Actions taken: Imaging Exam ended

## 2022-11-10 ENCOUNTER — Ambulatory Visit (INDEPENDENT_AMBULATORY_CARE_PROVIDER_SITE_OTHER): Payer: Medicare Other

## 2022-11-10 DIAGNOSIS — I4891 Unspecified atrial fibrillation: Secondary | ICD-10-CM | POA: Diagnosis not present

## 2022-11-11 LAB — CUP PACEART REMOTE DEVICE CHECK
Date Time Interrogation Session: 20240303231738
Implantable Pulse Generator Implant Date: 20210602

## 2022-11-21 NOTE — Progress Notes (Signed)
Carelink Summary Report / Loop Recorder 

## 2022-12-10 ENCOUNTER — Other Ambulatory Visit (INDEPENDENT_AMBULATORY_CARE_PROVIDER_SITE_OTHER): Payer: Self-pay | Admitting: Gastroenterology

## 2022-12-10 DIAGNOSIS — I8510 Secondary esophageal varices without bleeding: Secondary | ICD-10-CM

## 2022-12-15 ENCOUNTER — Ambulatory Visit (INDEPENDENT_AMBULATORY_CARE_PROVIDER_SITE_OTHER): Payer: Medicare Other

## 2022-12-15 DIAGNOSIS — I639 Cerebral infarction, unspecified: Secondary | ICD-10-CM | POA: Diagnosis not present

## 2022-12-16 LAB — CUP PACEART REMOTE DEVICE CHECK
Date Time Interrogation Session: 20240406001248
Implantable Pulse Generator Implant Date: 20210602

## 2022-12-22 NOTE — Progress Notes (Signed)
Carelink Summary Report / Loop Recorder 

## 2022-12-25 ENCOUNTER — Inpatient Hospital Stay: Payer: Medicare Other | Attending: Hematology

## 2022-12-25 DIAGNOSIS — I10 Essential (primary) hypertension: Secondary | ICD-10-CM | POA: Insufficient documentation

## 2022-12-25 DIAGNOSIS — K219 Gastro-esophageal reflux disease without esophagitis: Secondary | ICD-10-CM | POA: Insufficient documentation

## 2022-12-25 DIAGNOSIS — R059 Cough, unspecified: Secondary | ICD-10-CM | POA: Insufficient documentation

## 2022-12-25 DIAGNOSIS — G479 Sleep disorder, unspecified: Secondary | ICD-10-CM | POA: Insufficient documentation

## 2022-12-25 DIAGNOSIS — Z79899 Other long term (current) drug therapy: Secondary | ICD-10-CM | POA: Insufficient documentation

## 2022-12-25 DIAGNOSIS — Z833 Family history of diabetes mellitus: Secondary | ICD-10-CM | POA: Insufficient documentation

## 2022-12-25 DIAGNOSIS — R110 Nausea: Secondary | ICD-10-CM | POA: Diagnosis not present

## 2022-12-25 DIAGNOSIS — Z87891 Personal history of nicotine dependence: Secondary | ICD-10-CM | POA: Diagnosis not present

## 2022-12-25 DIAGNOSIS — C220 Liver cell carcinoma: Secondary | ICD-10-CM | POA: Diagnosis not present

## 2022-12-25 DIAGNOSIS — Z8249 Family history of ischemic heart disease and other diseases of the circulatory system: Secondary | ICD-10-CM | POA: Diagnosis not present

## 2022-12-25 LAB — CBC WITH DIFFERENTIAL/PLATELET
Abs Immature Granulocytes: 0.01 10*3/uL (ref 0.00–0.07)
Basophils Absolute: 0 10*3/uL (ref 0.0–0.1)
Basophils Relative: 1 %
Eosinophils Absolute: 0.2 10*3/uL (ref 0.0–0.5)
Eosinophils Relative: 5 %
HCT: 37.6 % (ref 36.0–46.0)
Hemoglobin: 12.5 g/dL (ref 12.0–15.0)
Immature Granulocytes: 0 %
Lymphocytes Relative: 16 %
Lymphs Abs: 0.7 10*3/uL (ref 0.7–4.0)
MCH: 32.4 pg (ref 26.0–34.0)
MCHC: 33.2 g/dL (ref 30.0–36.0)
MCV: 97.4 fL (ref 80.0–100.0)
Monocytes Absolute: 0.4 10*3/uL (ref 0.1–1.0)
Monocytes Relative: 9 %
Neutro Abs: 2.9 10*3/uL (ref 1.7–7.7)
Neutrophils Relative %: 69 %
Platelets: 132 10*3/uL — ABNORMAL LOW (ref 150–400)
RBC: 3.86 MIL/uL — ABNORMAL LOW (ref 3.87–5.11)
RDW: 14.3 % (ref 11.5–15.5)
WBC: 4.3 10*3/uL (ref 4.0–10.5)
nRBC: 0 % (ref 0.0–0.2)

## 2022-12-25 LAB — COMPREHENSIVE METABOLIC PANEL
ALT: 23 U/L (ref 0–44)
AST: 39 U/L (ref 15–41)
Albumin: 3 g/dL — ABNORMAL LOW (ref 3.5–5.0)
Alkaline Phosphatase: 162 U/L — ABNORMAL HIGH (ref 38–126)
Anion gap: 7 (ref 5–15)
BUN: 12 mg/dL (ref 8–23)
CO2: 24 mmol/L (ref 22–32)
Calcium: 8.8 mg/dL — ABNORMAL LOW (ref 8.9–10.3)
Chloride: 106 mmol/L (ref 98–111)
Creatinine, Ser: 0.95 mg/dL (ref 0.44–1.00)
GFR, Estimated: >60 mL/min (ref >60)
Glucose, Bld: 190 mg/dL — ABNORMAL HIGH (ref 70–99)
Potassium: 3.8 mmol/L (ref 3.5–5.1)
Sodium: 137 mmol/L (ref 135–145)
Total Bilirubin: 1 mg/dL (ref 0.3–1.2)
Total Protein: 6.2 g/dL — ABNORMAL LOW (ref 6.5–8.1)

## 2022-12-27 LAB — AFP TUMOR MARKER: AFP, Serum, Tumor Marker: 165 ng/mL — ABNORMAL HIGH (ref 0.0–9.2)

## 2022-12-31 ENCOUNTER — Other Ambulatory Visit: Payer: Self-pay | Admitting: Interventional Radiology

## 2022-12-31 DIAGNOSIS — C220 Liver cell carcinoma: Secondary | ICD-10-CM

## 2023-01-01 ENCOUNTER — Inpatient Hospital Stay (HOSPITAL_BASED_OUTPATIENT_CLINIC_OR_DEPARTMENT_OTHER): Payer: Medicare Other | Admitting: Hematology

## 2023-01-01 VITALS — BP 146/57 | HR 78 | Temp 98.3°F | Resp 18 | Wt 139.2 lb

## 2023-01-01 DIAGNOSIS — Z833 Family history of diabetes mellitus: Secondary | ICD-10-CM | POA: Diagnosis not present

## 2023-01-01 DIAGNOSIS — C220 Liver cell carcinoma: Secondary | ICD-10-CM

## 2023-01-01 DIAGNOSIS — R059 Cough, unspecified: Secondary | ICD-10-CM | POA: Diagnosis not present

## 2023-01-01 DIAGNOSIS — I10 Essential (primary) hypertension: Secondary | ICD-10-CM | POA: Diagnosis not present

## 2023-01-01 DIAGNOSIS — K219 Gastro-esophageal reflux disease without esophagitis: Secondary | ICD-10-CM | POA: Diagnosis not present

## 2023-01-01 DIAGNOSIS — Z8249 Family history of ischemic heart disease and other diseases of the circulatory system: Secondary | ICD-10-CM | POA: Diagnosis not present

## 2023-01-01 DIAGNOSIS — Z87891 Personal history of nicotine dependence: Secondary | ICD-10-CM | POA: Diagnosis not present

## 2023-01-01 DIAGNOSIS — Z79899 Other long term (current) drug therapy: Secondary | ICD-10-CM | POA: Diagnosis not present

## 2023-01-01 DIAGNOSIS — R110 Nausea: Secondary | ICD-10-CM | POA: Diagnosis not present

## 2023-01-01 DIAGNOSIS — G479 Sleep disorder, unspecified: Secondary | ICD-10-CM | POA: Diagnosis not present

## 2023-01-01 NOTE — Progress Notes (Signed)
[facility] [address] [place] [address]    Clinic Day:  01/01/2023  Referring physician: Elfredia Nevins, MD  Patient Care Team: Elfredia Nevins, MD as PCP - General (Internal Medicine) Wyline Mood, Dorothe Pea, MD as PCP - Cardiology (Cardiology) Ferman Hamming, DPM as Consulting Physician (Podiatry) Karilyn Cota, Joline Maxcy, MD as Consulting Physician (Gastroenterology) Newman Pies, MD as Consulting Physician (Otolaryngology) Beryle Beams, MD as Consulting Physician (Neurology) Samson Frederic, MD as Consulting Physician (Orthopedic Surgery) Darreld Mclean, MD as Consulting Physician (Orthopedic Surgery) Vickki Hearing, MD as Consulting Physician (Orthopedic Surgery) Jethro Bolus, MD as Consulting Physician (Ophthalmology) Sherren Kerns, MD (Inactive) as Consulting Physician (Vascular Surgery)   ASSESSMENT & PLAN:   Assessment: 1.  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  Lost 40 pounds in the last 4 years but did not lose any weight in the last 6 months. - CT CAP (04/12/2022): Cirrhosis with 4.5 cm hyperenhancing mass in the left lobe of liver.  Portal hypertension.  Extensive varices and splenomegaly. - MRI liver (07/24/2022): 4.8 x 2.5 cm segment 2/3 mass with arterial phase hyperenhancement.  Differential includes cholangiocarcinoma/mixed tumor/HCC.  No other liver lesions. - US guided left liver lobe mass biopsy on 08/15/2022 - Pathology: Well-differentiated HCC - AFP: 203 (07/01/2022), CA 19-9: 16 - 10/10/2022: Transarterial radioembolization of segment 2   2.  Social/family history: - She lives at home with her granddaughter.  Son lives across the street.  She worked as a Media planner prior to retirement.  Quit smoking 2 months ago.  Smokes 1 pack/week for the last 65 years. - No family history of malignancies.     Plan: 1.  Stage II (T2 N0 M0) well-differentiated HCC: - She underwent Y90 radioembolization on 10/10/2022. - She is doing fairly well and is back to her baseline. - Labs today: LFTs show normal bilirubin and mildly elevated alk phos.  CBC was grossly normal.  Mild thrombocytopenia stable.  aFP is 165, down from 203. - We will arrange for MRI of the liver with and without contrast in the next 2 weeks per patient preference to have it done close to home. - We will arrange for phone visit after the MRI.  Orders Placed This Encounter  Procedures   MR LIVER W WO CONTRAST    Standing Status:   Future    Standing Expiration Date:   01/01/2024    Order Specific Question:   If indicated for the ordered procedure, I authorize the administration of contrast media per Radiology protocol    Answer:   Yes    Order Specific Question:   What is the patient's sedation requirement?    Answer:   No Sedation    Order Specific Question:   Does the patient have a pacemaker or implanted devices?    Answer:   No    Order Specific Question:   Release to patient    Answer:   Immediate    Order Specific Question:   Preferred imaging location?    Answer:   [facility] (table limit - 550lbs)      I,Katie Daubenspeck,acting as a scribe for [name], MD.,have documented all relevant documentation on the behalf of [name], MD,as directed by  [name], MD while in the presence of [name], MD.   I, [name] MD, have reviewed the  above documentation for accuracy and completeness, and I agree with the above.   [name], MD   4/25/20245:03 PM  CHIEF COMPLAINT:   Diagnosis: Well-differentiated hepatocellular carcinoma    Cancer Staging  Cancer, hepatocellular Staging form: Liver, AJCC 8th Edition - Clinical stage from 08/21/2022: Stage II (cT2, cN0, cM0) - Unsigned    Prior Therapy: Y90 radioembolization 10/10/22  Current Therapy:  Observation   HISTORY OF PRESENT ILLNESS:   Oncology History   No history exists.     INTERVAL HISTORY:   Shayma is a 82 y.o. female presenting to clinic today for follow up of well-differentiated hepatocellular carcinoma. She was last seen by me on 08/21/22 in consultation.  Since her last visit, she proceeded with Y90 embolization on 10/10/22 under Dr. Elby Showers.  Today, she states that she is doing well overall. Her appetite level is at 75%. Her energy level is at 75%.  PAST MEDICAL HISTORY:   Past Medical History: Past Medical History:  Diagnosis Date   Anemia    Anxiety    Aortic insufficiency    Moderate   Atrial fibrillation (HCC)    Back pain    Carotid stenosis, bilateral    Cirrhosis (HCC)    Closed left femoral fracture (HCC) 04/21/2017   Coronary artery disease    Stent x 2 RCA 1995, cardiac catheterization 09/2016 showing only mild atherosclerosis   DDD (degenerative disc disease), lumbar    Depression    Diabetes mellitus without complication (HCC)    Essential hypertension    Falls    GERD (gastroesophageal reflux disease)    Hiatal hernia    History of stroke    Hypercholesteremia    IBS (irritable bowel syndrome)    Non-alcoholic fatty liver disease    OSA (obstructive sleep apnea)    no CPAP   Pericardial effusion    a. small by echo 2018.   PSVT (paroxysmal supraventricular tachycardia)    Recurrent UTI    Seizures (HCC) 2018   Stroke (HCC) 07/2019   several strokes within the last 10 years   Swallowing difficulty    Varices, esophageal (HCC)     Surgical History: Past Surgical History:  Procedure Laterality Date   ANAL FISSURE REPAIR  1972   APPENDECTOMY     BACK SURGERY  1985   CARDIAC CATHETERIZATION  [phone]   Stent to the proximal RCA after MI    CARDIAC CATHETERIZATION N/A 09/26/2016   Procedure: Left Heart Cath and Coronary Angiography;  Surgeon: Marykay Lex, MD;  Location: [facility] INVASIVE CV LAB;  Service: Cardiovascular;   Laterality: N/A;   CATARACT EXTRACTION W/PHACO Right 07/04/2014   Procedure: CATARACT EXTRACTION PHACO AND INTRAOCULAR LENS PLACEMENT (IOC);  Surgeon: Loraine Leriche T. Nile Riggs, MD;  Location: [facility];  Service: Ophthalmology;  Laterality: Right;  CDE:13.13   COLONOSCOPY     DILATION AND CURETTAGE OF UTERUS     x2   Epi Retinal Membrane Peel Left    ERCP     ESOPHAGEAL BANDING N/A 04/01/2013   Procedure: ESOPHAGEAL BANDING;  Surgeon: Malissa Hippo, MD;  Location: [facility];  Service: Endoscopy;  Laterality: N/A;   ESOPHAGEAL BANDING N/A 05/24/2013   Procedure: ESOPHAGEAL BANDING;  Surgeon: Malissa Hippo, MD;  Location: [facility];  Service: Endoscopy;  Laterality: N/A;   ESOPHAGEAL BANDING N/A 06/21/2014   Procedure: ESOPHAGEAL BANDING;  Surgeon: Malissa Hippo, MD;  [facility]: [facility];  Service: Endoscopy;  Laterality: N/A;   ESOPHAGEAL  BANDING  06/15/2020   Procedure: ESOPHAGEAL BANDING;  Surgeon: Marguerita Merles, Reuel Boom, MD;  Location: [facility];  Service: Gastroenterology;;   ESOPHAGOGASTRODUODENOSCOPY N/A 04/01/2013   Procedure: ESOPHAGOGASTRODUODENOSCOPY (EGD);  Surgeon: Malissa Hippo, MD;  [facility]: [facility];  Service: Endoscopy;  Laterality: N/A;  230-rescheduled to 8:30am Ann notified pt   ESOPHAGOGASTRODUODENOSCOPY N/A 05/24/2013   Procedure: ESOPHAGOGASTRODUODENOSCOPY (EGD);  Surgeon: Malissa Hippo, MD;  [facility]: [facility];  Service: Endoscopy;  Laterality: N/A;  730   ESOPHAGOGASTRODUODENOSCOPY N/A 06/21/2014   Procedure: ESOPHAGOGASTRODUODENOSCOPY (EGD);  Surgeon: Malissa Hippo, MD;  [facility];  Service: Endoscopy;  Laterality: N/A;  930-rescheduled 10/14 @ 1200 Ann to notify pt   ESOPHAGOGASTRODUODENOSCOPY (EGD) WITH PROPOFOL N/A 06/15/2020   Procedure: ESOPHAGOGASTRODUODENOSCOPY (EGD) WITH PROPOFOL;  Surgeon: Dolores Frame, MD;  [facility];  Service: Gastroenterology;  Laterality: N/A;  230    ESOPHAGOGASTRODUODENOSCOPY (EGD) WITH PROPOFOL N/A 07/27/2020   Procedure: ESOPHAGOGASTRODUODENOSCOPY (EGD) WITH PROPOFOL;  Surgeon: Dolores Frame, MD;  [facility];  Service: Gastroenterology;  Laterality: N/A;  1245   ESOPHAGOGASTRODUODENOSCOPY (EGD) WITH PROPOFOL N/A 08/19/2022   Procedure: ESOPHAGOGASTRODUODENOSCOPY (EGD) WITH PROPOFOL;  Surgeon: Dolores Frame, MD;  Location: [facility];  Service: Gastroenterology;  Laterality: N/A;  130 ASA 3, pt knows to arrive at 6:15   EYE SURGERY  08   cataract surgery of the left eye   FEMUR IM NAIL Left 03/02/2017   Procedure: INTRAMEDULLARY (IM) NAIL FEMORAL;  Surgeon: Samson Frederic, MD;  Location: [facility];  Service: Orthopedics;  Laterality: Left;   HARDWARE REMOVAL Right 01/17/2013   Procedure: REMOVAL OF HARDWARE AND EXCISION ULNAR STYLOID RIGHT WRIST;  Surgeon: Tami Ribas, MD;  Location: [facility];  Service: Orthopedics;  Laterality: Right;   implanted heart monitor  02/2020   to monitor Afib   IR 3D INDEPENDENT WKST  09/26/2022   IR ANGIOGRAM SELECTIVE EACH ADDITIONAL VESSEL  09/26/2022   IR ANGIOGRAM SELECTIVE EACH ADDITIONAL VESSEL  09/26/2022   IR ANGIOGRAM SELECTIVE EACH ADDITIONAL VESSEL  09/26/2022   IR ANGIOGRAM SELECTIVE EACH ADDITIONAL VESSEL  09/26/2022   IR ANGIOGRAM SELECTIVE EACH ADDITIONAL VESSEL  09/26/2022   IR ANGIOGRAM SELECTIVE EACH ADDITIONAL VESSEL  10/10/2022   IR ANGIOGRAM SELECTIVE EACH ADDITIONAL VESSEL  10/10/2022   IR ANGIOGRAM SELECTIVE EACH ADDITIONAL VESSEL  10/10/2022   IR ANGIOGRAM VISCERAL SELECTIVE  09/26/2022   IR ANGIOGRAM VISCERAL SELECTIVE  10/10/2022   IR EMBO TUMOR ORGAN ISCHEMIA INFARCT INC GUIDE ROADMAPPING  10/10/2022   IR INTRAVASCULAR ULTRASOUND NON CORONARY  09/26/2022   IR RADIOLOGIST EVAL & MGMT  09/10/2022   IR RADIOLOGIST EVAL & MGMT  11/03/2022   IR TRANSCATH PLC STENT 1ST ART NOT LE CV CAR VERT CAR  09/26/2022   IR US GUIDE VASC ACCESS RIGHT  09/26/2022    IR US GUIDE VASC ACCESS RIGHT  10/10/2022   MYRINGOTOMY  2012   both ears   ORIF FEMUR FRACTURE Left 04/23/2017   Procedure: OPEN REDUCTION INTERNAL FIXATION (ORIF) DISTAL FEMUR FRACTURE (FRACTURE AROUND FEMORAL NAIL);  Surgeon: Myrene Galas, MD;  [facility];  Service: Orthopedics;  Laterality: Left;   TONSILLECTOMY     TUBAL LIGATION     VAGINAL HYSTERECTOMY  1972   WRIST SURGERY     rt wrist hardwear removal    Social History: Social History   Socioeconomic History   Marital status: Widowed    Spouse name:  Not on file   Number of children: 2   Years of education: 2 years college   Highest education level: Some college, no degree  Occupational History    Employer: RETIRED  Tobacco Use   Smoking status: Former    Packs/day: 0.25    Years: 50.00    Additional pack years: 0.00    Total pack years: 12.50    Types: Cigarettes    Quit date: 07/09/2022    Years since quitting: 0.4    Passive exposure: Current   Smokeless tobacco: Never   Tobacco comments:    12/23/21 1 pack Q two weeks.  Vaping Use   Vaping Use: Never used  Substance and Sexual Activity   Alcohol use: No    Alcohol/week: 0.0 standard drinks of alcohol   Drug use: No   Sexual activity: Not Currently    Birth control/protection: Surgical  Other Topics Concern   Not on file  Social History Narrative   12/23/21  Lives with 3 grandchildren, sone and dgtr-in -law are across the st   Social Determinants of Health   Financial Resource Strain: Low Risk  (09/15/2022)   Overall Financial Resource Strain (CARDIA)    Difficulty of Paying Living Expenses: Not hard at all  Food Insecurity: No Food Insecurity (09/26/2021)   Hunger Vital Sign    Worried About Running Out of Food in the Last Year: Never true    Ran Out of Food in the Last Year: Never true  Transportation Needs: No Transportation Needs (09/15/2022)   PRAPARE - Administrator, Civil Service (Medical): No    Lack of Transportation  (Non-Medical): No  Physical Activity: Inactive (03/16/2019)   Exercise Vital Sign    Days of Exercise per Week: 0 days    Minutes of Exercise per Session: 0 min  Stress: No Stress Concern Present (03/16/2019)   [employer] of Occupational Health - Occupational Stress Questionnaire    Feeling of Stress : Not at all  Social Connections: Moderately Isolated (03/16/2019)   Social Connection and Isolation Panel [NHANES]    Frequency of Communication with Friends and Family: More than three times a week    Frequency of Social Gatherings with Friends and Family: Never    Attends Religious Services: 1 to 4 times per year    Active Member of [employer] or Organizations: No    Attends Banker Meetings: Never    Marital Status: Widowed  Intimate Partner Violence: Not At Risk (03/16/2019)   Humiliation, Afraid, Rape, and Kick questionnaire    Fear of Current or Ex-Partner: No    Emotionally Abused: No    Physically Abused: No    Sexually Abused: No    Family History: Family History  Problem Relation Age of Onset   Diabetes Mother    Heart failure Father    Heart failure Maternal Aunt     Current Medications:  Current Outpatient Medications:    lactulose (CHRONULAC) 10 GM/15ML solution, , Disp: , Rfl:    acetaminophen (TYLENOL) 500 MG tablet, Take 1,000 mg by mouth at bedtime., Disp: , Rfl:    apixaban (ELIQUIS) 5 MG TABS tablet, TAKE (1) TABLET BY MOUTH TWICE DAILY., Disp: 60 tablet, Rfl: 2   aspirin EC 81 MG tablet, Take 1 tablet (81 mg total) by mouth daily. Swallow whole., Disp: 30 tablet, Rfl: 12   atorvastatin (LIPITOR) 80 MG tablet, Take 1 tablet (80 mg total) by mouth daily. (Patient taking differently: Take 80  mg by mouth at bedtime.), Disp: 30 tablet, Rfl: 1   azelastine (ASTELIN) 0.1 % nasal spray, Place 1 spray into both nostrils at bedtime., Disp: , Rfl:    CALCIUM CITRATE PO, Take 200 mg by mouth daily., Disp: , Rfl:    carvedilol (COREG) 6.25 MG tablet, TAKE (1)  TABLET BY MOUTH TWICE DAILY WITH A MEAL., Disp: 180 tablet, Rfl: 3   Cholecalciferol (VITAMIN D3) 50 MCG (2000 UT) TABS, Take 2,000 Units by mouth daily., Disp: , Rfl:    Coenzyme Q10 (CO Q-10) 100 MG CAPS, Take 100 mg by mouth in the morning., Disp: , Rfl:    diltiazem (CARDIZEM CD) 180 MG 24 hr capsule, Take 180 mg by mouth daily., Disp: , Rfl:    doxepin (SINEQUAN) 25 MG capsule, Take 25 mg by mouth at bedtime. (Patient not taking: Reported on 11/03/2022), Disp: , Rfl:    furosemide (LASIX) 40 MG tablet, Take 0.5 tablets (20 mg total) by mouth daily. TAKE (1) TABLET BY MOUTH ONCE DAILY. MAY TAKE ADDITIONAL HALF IF NEEDED FOR SEVERE SWELLING. Strength: 20 mg (Patient taking differently: Take 20-40 mg by mouth See admin instructions. Alternate taking 20 mg one day and 40 mg the next), Disp: 45 tablet, Rfl: 0   Multiple Vitamin (MULTIVITAMIN WITH MINERALS) TABS tablet, Take 1 tablet by mouth daily., Disp: , Rfl:    nitroGLYCERIN (NITROSTAT) 0.4 MG SL tablet, Place 0.4 mg under the tongue every 5 (five) minutes as needed for chest pain., Disp: , Rfl:    omeprazole (PRILOSEC) 40 MG capsule, Take 1 capsule (40 mg total) by mouth daily., Disp: 90 capsule, Rfl: 3   ondansetron (ZOFRAN-ODT) 4 MG disintegrating tablet, Take 4 mg by mouth every 8 (eight) hours as needed for nausea or vomiting., Disp: , Rfl:    potassium chloride SA (KLOR-CON M) 20 MEQ tablet, Take 20 mEq by mouth 2 (two) times daily., Disp: , Rfl:  No current facility-administered medications for this visit.  Facility-Administered Medications Ordered in Other Visits:    dexamethasone (DECADRON) injection 8 mg, 8 mg, Intravenous, Once, Boisseau, Hayley, PA   Allergies: Allergies  Allergen Reactions   Tape Rash and Other (See Comments)    Old paper tape caused a reaction, doesn't react to most other tapes     REVIEW OF SYSTEMS:   Review of Systems  Constitutional:  Negative for chills, fatigue and fever.  HENT:   Positive for trouble  swallowing. Negative for lump/mass, mouth sores, nosebleeds and sore throat.   Eyes:  Negative for eye problems.  Respiratory:  Positive for cough. Negative for shortness of breath.   Cardiovascular:  Negative for chest pain, leg swelling and palpitations.  Gastrointestinal:  Positive for nausea. Negative for abdominal pain, constipation, diarrhea and vomiting.  Genitourinary:  Negative for bladder incontinence, difficulty urinating, dysuria, frequency, hematuria and nocturia.   Musculoskeletal:  Negative for arthralgias, back pain, flank pain, myalgias and neck pain.  Skin:  Negative for itching and rash.  Neurological:  Negative for dizziness, headaches and numbness.  Hematological:  Does not bruise/bleed easily.  Psychiatric/Behavioral:  Positive for sleep disturbance. Negative for depression and suicidal ideas. The patient is not nervous/anxious.   All other systems reviewed and are negative.    VITALS:   Blood pressure (!) 146/57, pulse 78, temperature 98.3 F (36.8 C), temperature source Oral, resp. rate 18, weight 139 lb 3.2 oz (63.1 kg), SpO2 98 %.  Wt Readings from Last 3 Encounters:  01/01/23 139 lb 3.2  oz (63.1 kg)  11/03/22 130 lb 4.8 oz (59.1 kg)  10/23/22 133 lb (60.3 kg)    Body mass index is 24.66 kg/m.  Performance status (ECOG): 1 - Symptomatic but completely ambulatory  PHYSICAL EXAM:   Physical Exam Vitals and nursing note reviewed. Exam conducted with a chaperone present.  Constitutional:      Appearance: Normal appearance.  Cardiovascular:     Rate and Rhythm: Normal rate and regular rhythm.     Pulses: Normal pulses.     Heart sounds: Normal heart sounds.  Pulmonary:     Effort: Pulmonary effort is normal.     Breath sounds: Normal breath sounds.  Abdominal:     Palpations: Abdomen is soft. There is no hepatomegaly, splenomegaly or mass.     Tenderness: There is no abdominal tenderness.  Musculoskeletal:     Right lower leg: No edema.     Left  lower leg: No edema.  Lymphadenopathy:     Cervical: No cervical adenopathy.     Right cervical: No superficial, deep or posterior cervical adenopathy.    Left cervical: No superficial, deep or posterior cervical adenopathy.     Upper Body:     Right upper body: No supraclavicular or axillary adenopathy.     Left upper body: No supraclavicular or axillary adenopathy.  Neurological:     General: No focal deficit present.     Mental Status: She is alert and oriented to person, place, and time.  Psychiatric:        Mood and Affect: Mood normal.        Behavior: Behavior normal.     LABS:      Latest Ref Rng & Units 12/25/2022    1:08 PM 10/10/2022    8:32 AM 09/26/2022    7:56 AM  CBC  WBC 4.0 - 10.5 K/uL 4.3  6.5  6.3   Hemoglobin 12.0 - 15.0 g/dL 16.1  09.6  04.5   Hematocrit 36.0 - 46.0 % 37.6  38.5  41.8   Platelets 150 - 400 K/uL 132  136  148       Latest Ref Rng & Units 12/25/2022    1:08 PM 10/10/2022    8:32 AM 09/26/2022    7:56 AM  CMP  Glucose 70 - 99 mg/dL 409  811  914   BUN 8 - 23 mg/dL 12  13  14    Creatinine 0.44 - 1.00 mg/dL 7.82  9.56  2.13   Sodium 135 - 145 mmol/L 137  138  138   Potassium 3.5 - 5.1 mmol/L 3.8  4.9  4.2   Chloride 98 - 111 mmol/L 106  109  104   CO2 22 - 32 mmol/L 24  19  25    Calcium 8.9 - 10.3 mg/dL 8.8  9.1  9.3   Total Protein 6.5 - 8.1 g/dL 6.2  6.2  6.7   Total Bilirubin 0.3 - 1.2 mg/dL 1.0  1.6  1.2   Alkaline Phos 38 - 126 U/L 162  132  149   AST 15 - 41 U/L 39  44  62   ALT 0 - 44 U/L 23  24  46      No results found for: "CEA1", "CEA" / No results found for: "CEA1", "CEA" No results found for: "PSA1" Lab Results  Component Value Date   [id number] 07/29/2022   No results found for: "CAN125"  No results found for: "TOTALPROTELP", "ALBUMINELP", "A1GS", "A2GS", "BETS", "  BETA2SER", "GAMS", "MSPIKE", "SPEI" No results found for: "TIBC", "FERRITIN", "IRONPCTSAT" No results found for: "LDH"   STUDIES:   CUP PACEART REMOTE  DEVICE CHECK  Result Date: 12/16/2022 ILR summary report received. Battery status OK. Normal device function. No new symptom, tachy, brady, episodes. Persistent AF, controlled rates, Eliquis per prior report 7 pause events, each 3sec,  some EGM's show undersensing, other events nocturna - true pausel, not a new finding. Monthly summary reports and ROV/PRN LA, CVRS

## 2023-01-01 NOTE — Patient Instructions (Signed)
[facility]  Discharge Instructions  You were seen and examined today by Dr. Ellin Saba.  Dr. Ellin Saba discussed your most recent lab work which revealed that the AFP tumor marker has came down.  Dr. Ellin Saba is repeating MRI of your Liver.  Follow-up as scheduled.    Thank you for choosing [facility] to provide your oncology and hematology care.   To afford each patient quality time with our provider, please arrive at least 15 minutes before your scheduled appointment time. You may need to reschedule your appointment if you arrive late (10 or more minutes). Arriving late affects you and other patients whose appointments are after yours.  Also, if you miss three or more appointments without notifying the office, you may be dismissed from the clinic at the provider's discretion.    Again, thank you for choosing [facility].  Our hope is that these requests will decrease the amount of time that you wait before being seen by our physicians.   If you have a lab appointment with the Cancer Center - please note that after April 8th, all labs will be drawn in the cancer center.  You do not have to check in or register with the main entrance as you have in the past but will complete your check-in at the cancer center.            _____________________________________________________________  Should you have questions after your visit to [facility], please contact our office at [phone] and follow the prompts.  Our office hours are 8:00 a.m. to 4:30 p.m. Monday - Thursday and 8:00 a.m. to 2:30 p.m. Friday.  Please note that voicemails left after 4:00 p.m. may not be returned until the following business day.  We are closed weekends and all major holidays.  You do have access to a nurse 24-7, just call the main number to the clinic [phone] and do not press any options, hold on the line and a nurse will  answer the phone.    For prescription refill requests, have your pharmacy contact our office and allow 72 hours.    Masks are no longer required in the cancer centers. If you would like for your care team to wear a mask while they are taking care of you, please let them know. You may have one support person who is at least 81 years old accompany you for your appointments.

## 2023-01-06 ENCOUNTER — Ambulatory Visit (INDEPENDENT_AMBULATORY_CARE_PROVIDER_SITE_OTHER): Payer: Medicare Other | Admitting: Gastroenterology

## 2023-01-07 ENCOUNTER — Other Ambulatory Visit: Payer: Self-pay | Admitting: Interventional Radiology

## 2023-01-07 DIAGNOSIS — C220 Liver cell carcinoma: Secondary | ICD-10-CM

## 2023-01-09 ENCOUNTER — Other Ambulatory Visit: Payer: Self-pay | Admitting: Cardiology

## 2023-01-09 DIAGNOSIS — I4891 Unspecified atrial fibrillation: Secondary | ICD-10-CM

## 2023-01-09 NOTE — Telephone Encounter (Signed)
Prescription refill request for Eliquis received. Indication: cva, afib  Last office visit:Branch 10/23/2022 Scr: 0.95, 12/25/2022 Age: 82 yo  Weight: 63.1 kg   Refill sent

## 2023-01-15 ENCOUNTER — Ambulatory Visit (INDEPENDENT_AMBULATORY_CARE_PROVIDER_SITE_OTHER): Payer: Medicare Other

## 2023-01-15 DIAGNOSIS — I639 Cerebral infarction, unspecified: Secondary | ICD-10-CM | POA: Diagnosis not present

## 2023-01-15 LAB — CUP PACEART REMOTE DEVICE CHECK
Date Time Interrogation Session: 20240509001554
Implantable Pulse Generator Implant Date: 20210602

## 2023-01-22 NOTE — Progress Notes (Signed)
Carelink Summary Report / Loop Recorder 

## 2023-01-27 ENCOUNTER — Encounter (HOSPITAL_COMMUNITY): Payer: Self-pay | Admitting: Radiology

## 2023-01-27 ENCOUNTER — Ambulatory Visit (HOSPITAL_COMMUNITY)
Admission: RE | Admit: 2023-01-27 | Discharge: 2023-01-27 | Disposition: A | Discharge status: Home / Self Care | Payer: Medicare Other | Source: Ambulatory Visit | Attending: Hematology | Admitting: Hematology

## 2023-01-27 DIAGNOSIS — C220 Liver cell carcinoma: Secondary | ICD-10-CM

## 2023-01-27 DIAGNOSIS — K7460 Unspecified cirrhosis of liver: Secondary | ICD-10-CM | POA: Diagnosis not present

## 2023-01-27 MED ORDER — GADOBUTROL 1 MMOL/ML IV SOLN
6.0000 mL | Freq: Once | INTRAVENOUS | Status: AC | PRN
  Administered 2023-01-27: 6 mL via INTRAVENOUS

## 2023-02-03 ENCOUNTER — Encounter: Payer: Self-pay | Admitting: Hematology

## 2023-02-03 ENCOUNTER — Inpatient Hospital Stay: Payer: Medicare Other | Attending: Hematology | Admitting: Hematology

## 2023-02-03 DIAGNOSIS — C220 Liver cell carcinoma: Secondary | ICD-10-CM | POA: Diagnosis not present

## 2023-02-03 NOTE — Progress Notes (Signed)
Virtual Visit via Telephone Note  I connected with [name] on 02/03/23 at  4:00 PM EDT by telephone and verified that I am speaking with the correct person using two identifiers.  Location: Patient: At home Provider: In the office   I discussed the limitations, risks, security and privacy concerns of performing an evaluation and management service by telephone and the availability of in person appointments. I also discussed with the patient that there may be a patient responsible charge related to this service. The patient expressed understanding and agreed to proceed.   History of Present Illness: She was seen by me for NASH cirrhosis complicated by well-differentiated HCC.  She underwent transarterial radioembolization of segment 2 lesion on 10/10/2022.   Observations/Objective: She denies any right upper quadrant pain.  Denies any nausea or vomiting.  Appetite has been good.  Energy levels are 75%.  Assessment and Plan:  1.  Stage II (T2 N0 M0) well-differentiated HCC: - Labs from 12/25/2022 shows AFP 165, down from 203. - MRI liver with and without contrast from 01/27/2023: Respiratory motion artifact.  Posterior left lobe of liver hepatic segment 2/3 is now without contrast-enhancement with adjacent coil susceptibility artifact diminished in size measuring 4.4 x 1.5 cm (5.4 x 3.1 cm).  No new liver lesions.  Other benign findings were explained to the patient. - Recommend follow-up in 3 to 4 months with repeat MRI and AFP and LFTs.   Follow Up Instructions: RTC 3 to 4 months with scan and labs.   I discussed the assessment and treatment plan with the patient. The patient was provided an opportunity to ask questions and all were answered. The patient agreed with the plan and demonstrated an understanding of the instructions.   The patient was advised to call back or seek an in-person evaluation if the symptoms worsen or if the condition fails to improve as anticipated.  I  provided 21 minutes of non-face-to-face time during this encounter.   [name], MD

## 2023-02-04 ENCOUNTER — Other Ambulatory Visit: Payer: Self-pay

## 2023-02-04 DIAGNOSIS — C220 Liver cell carcinoma: Secondary | ICD-10-CM

## 2023-02-04 NOTE — Progress Notes (Signed)
Message from Dr. Ellin Saba- Please order MRI of the liver with and without contrast, AFP, CBC and CMP and return to clinic in 4 months.   Orders placed per Dr. Marice Potter orders. Schedulers are aware and will schedule appointments for labs, return visit and scan.

## 2023-02-04 NOTE — Progress Notes (Signed)
Carelink Summary Report / Loop Recorder 

## 2023-02-06 DIAGNOSIS — I10 Essential (primary) hypertension: Secondary | ICD-10-CM | POA: Diagnosis not present

## 2023-02-06 DIAGNOSIS — E039 Hypothyroidism, unspecified: Secondary | ICD-10-CM | POA: Diagnosis not present

## 2023-02-06 DIAGNOSIS — I110 Hypertensive heart disease with heart failure: Secondary | ICD-10-CM | POA: Diagnosis not present

## 2023-02-10 ENCOUNTER — Ambulatory Visit
Admission: RE | Admit: 2023-02-10 | Discharge: 2023-02-10 | Disposition: A | Discharge status: Home / Self Care | Payer: Medicare Other | Source: Ambulatory Visit | Attending: Interventional Radiology | Admitting: Interventional Radiology

## 2023-02-10 DIAGNOSIS — Z95828 Presence of other vascular implants and grafts: Secondary | ICD-10-CM | POA: Diagnosis not present

## 2023-02-10 DIAGNOSIS — C220 Liver cell carcinoma: Secondary | ICD-10-CM

## 2023-02-10 HISTORY — PX: IR RADIOLOGIST EVAL & MGMT: IMG5224

## 2023-02-10 NOTE — Progress Notes (Signed)
Referring Physician(s): Levon Hedger Mayorga,D/Katragadda,S    Reason for follow up: The patient is seen in follow up today via virtual telephone clinic visit s/p segment II radiation segmentectomy on 10/10/22   History of present illness: Initial HPI from 09/10/22:  [name] is a 82 y.o. female with history of recent incidentally discovered and now biopsy proven left lobe unifocal hepatocellular carcinoma.  She presents today via virtual telephone visit accompanied by her grand daughter, Denaye Anspach [phone]).     Ms. Streetman was admitted to the hospital in August 2023 for sepsis/UTI at which time a CT was performed which demonstrated cirrhosis and a concerning 4.5 cm left lobe liver mass. This prompted further evaluation with MRI which demonstrated a 4.8 cm segment II LR-M mass.  Ultrasound guided biopsy was performed on 08/15/22 which confirmed hepatocellular carcinoma.     She has been doing well overall.  No abdominal pain, nausea, vomiting, fevers, chills, chest pain, shortness of breath, jaundice.  She has intermittently poor appetite.     She underwent Tc-MAA mapping study on 09/26/22 which required celiac artery stent placement for catheterization of the hepatic artery.  This was followed by transarterial radioembolization to the segment II branches supplying the biopsy proven HCC on 10/10/22.  The procedure went as planned without complication.    Overall doing well.  Eating more than prior but still has a poor appetite.  She complains about having to continue to take lactulose, though remains compliant.    Past Medical History:  Diagnosis Date   Anemia    Anxiety    Aortic insufficiency    Moderate   Atrial fibrillation (HCC)    Back pain    Carotid stenosis, bilateral    Cirrhosis (HCC)    Closed left femoral fracture (HCC) 04/21/2017   Coronary artery disease    Stent x 2 RCA 1995, cardiac catheterization 09/2016 showing only mild atherosclerosis   DDD (degenerative disc  disease), lumbar    Depression    Diabetes mellitus without complication (HCC)    Essential hypertension    Falls    GERD (gastroesophageal reflux disease)    Hiatal hernia    History of stroke    Hypercholesteremia    IBS (irritable bowel syndrome)    Non-alcoholic fatty liver disease    OSA (obstructive sleep apnea)    no CPAP   Pericardial effusion    a. small by echo 2018.   PSVT (paroxysmal supraventricular tachycardia)    Recurrent UTI    Seizures (HCC) 2018   Stroke (HCC) 07/2019   several strokes within the last 10 years   Swallowing difficulty    Varices, esophageal (HCC)     Past Surgical History:  Procedure Laterality Date   ANAL FISSURE REPAIR  1972   APPENDECTOMY     BACK SURGERY  1985   CARDIAC CATHETERIZATION  [phone]   Stent to the proximal RCA after MI    CARDIAC CATHETERIZATION N/A 09/26/2016   Procedure: Left Heart Cath and Coronary Angiography;  Surgeon: Marykay Lex, MD;  Location: [facility] INVASIVE CV LAB;  Service: Cardiovascular;  Laterality: N/A;   CATARACT EXTRACTION W/PHACO Right 07/04/2014   Procedure: CATARACT EXTRACTION PHACO AND INTRAOCULAR LENS PLACEMENT (IOC);  Surgeon: Loraine Leriche T. Nile Riggs, MD;  Location: [facility] OR[facility];  Service: Ophthalmology;  Laterality: Right;  CDE:13.13   COLONOSCOPY     DILATION AND CURETTAGE OF UTERUS     x2   Epi Retinal Membrane Peel Left  ERCP     ESOPHAGEAL BANDING N/A 04/01/2013   Procedure: ESOPHAGEAL BANDING;  Surgeon: Malissa Hippo, MD;  Location: [facility];  Service: Endoscopy;  Laterality: N/A;   ESOPHAGEAL BANDING N/A 05/24/2013   Procedure: ESOPHAGEAL BANDING;  Surgeon: Malissa Hippo, MD;  Location: [facility];  Service: Endoscopy;  Laterality: N/A;   ESOPHAGEAL BANDING N/A 06/21/2014   Procedure: ESOPHAGEAL BANDING;  Surgeon: Malissa Hippo, MD;  Location: [facility];  Service: Endoscopy;  Laterality: N/A;   ESOPHAGEAL BANDING  06/15/2020   Procedure: ESOPHAGEAL BANDING;  Surgeon: Dolores Frame, MD;  Location: [facility];  Service: Gastroenterology;;   ESOPHAGOGASTRODUODENOSCOPY N/A 04/01/2013   Procedure: ESOPHAGOGASTRODUODENOSCOPY (EGD);  Surgeon: Malissa Hippo, MD;  Location: [facility];  Service: Endoscopy;  Laterality: N/A;  230-rescheduled to 8:30am Ann notified pt   ESOPHAGOGASTRODUODENOSCOPY N/A 05/24/2013   Procedure: ESOPHAGOGASTRODUODENOSCOPY (EGD);  Surgeon: Malissa Hippo, MD;  [facility]: [facility];  Service: Endoscopy;  Laterality: N/A;  730   ESOPHAGOGASTRODUODENOSCOPY N/A 06/21/2014   Procedure: ESOPHAGOGASTRODUODENOSCOPY (EGD);  Surgeon: Malissa Hippo, MD;  [facility]: [facility];  Service: Endoscopy;  Laterality: N/A;  930-rescheduled 10/14 @ 1200 Ann to notify pt   ESOPHAGOGASTRODUODENOSCOPY (EGD) WITH PROPOFOL N/A 06/15/2020   Procedure: ESOPHAGOGASTRODUODENOSCOPY (EGD) WITH PROPOFOL;  Surgeon: Dolores Frame, MD;  [facility]: [facility];  Service: Gastroenterology;  Laterality: N/A;  230   ESOPHAGOGASTRODUODENOSCOPY (EGD) WITH PROPOFOL N/A 07/27/2020   Procedure: ESOPHAGOGASTRODUODENOSCOPY (EGD) WITH PROPOFOL;  Surgeon: Dolores Frame, MD;  [facility]: [facility];  Service: Gastroenterology;  Laterality: N/A;  1245   ESOPHAGOGASTRODUODENOSCOPY (EGD) WITH PROPOFOL N/A 08/19/2022   Procedure: ESOPHAGOGASTRODUODENOSCOPY (EGD) WITH PROPOFOL;  Surgeon: Dolores Frame, MD;  Location: [facility];  Service: Gastroenterology;  Laterality: N/A;  130 ASA 3, pt knows to arrive at 6:15   EYE SURGERY  08   cataract surgery of the left eye   FEMUR IM NAIL Left 03/02/2017   Procedure: INTRAMEDULLARY (IM) NAIL FEMORAL;  Surgeon: Samson Frederic, MD;  Location: [facility] OR;  Service: Orthopedics;  Laterality: Left;   HARDWARE REMOVAL Right 01/17/2013   Procedure: REMOVAL OF HARDWARE AND EXCISION ULNAR STYLOID RIGHT WRIST;  Surgeon: Tami Ribas, MD;  Location:  SURGERY CENTER;  Service: Orthopedics;  Laterality:  Right;   implanted heart monitor  02/2020   to monitor Afib   IR 3D INDEPENDENT WKST  09/26/2022   IR ANGIOGRAM SELECTIVE EACH ADDITIONAL VESSEL  09/26/2022   IR ANGIOGRAM SELECTIVE EACH ADDITIONAL VESSEL  09/26/2022   IR ANGIOGRAM SELECTIVE EACH ADDITIONAL VESSEL  09/26/2022   IR ANGIOGRAM SELECTIVE EACH ADDITIONAL VESSEL  09/26/2022   IR ANGIOGRAM SELECTIVE EACH ADDITIONAL VESSEL  09/26/2022   IR ANGIOGRAM SELECTIVE EACH ADDITIONAL VESSEL  10/10/2022   IR ANGIOGRAM SELECTIVE EACH ADDITIONAL VESSEL  10/10/2022   IR ANGIOGRAM SELECTIVE EACH ADDITIONAL VESSEL  10/10/2022   IR ANGIOGRAM VISCERAL SELECTIVE  09/26/2022   IR ANGIOGRAM VISCERAL SELECTIVE  10/10/2022   IR EMBO TUMOR ORGAN ISCHEMIA INFARCT INC GUIDE ROADMAPPING  10/10/2022   IR INTRAVASCULAR ULTRASOUND NON CORONARY  09/26/2022   IR RADIOLOGIST EVAL & MGMT  09/10/2022   IR RADIOLOGIST EVAL & MGMT  11/03/2022   IR TRANSCATH PLC STENT 1ST ART NOT LE CV CAR VERT CAR  09/26/2022   IR US GUIDE VASC ACCESS RIGHT  09/26/2022   IR US GUIDE VASC ACCESS RIGHT  10/10/2022   MYRINGOTOMY  2012  both ears   ORIF FEMUR FRACTURE Left 04/23/2017   Procedure: OPEN REDUCTION INTERNAL FIXATION (ORIF) DISTAL FEMUR FRACTURE (FRACTURE AROUND FEMORAL NAIL);  Surgeon: Myrene Galas, MD;  Location[facility] OR;  Service: Orthopedics;  Laterality: Left;   TONSILLECTOMY     TUBAL LIGATION     VAGINAL HYSTERECTOMY  1972   WRIST SURGERY     rt wrist hardwear removal    Allergies: Tape  Medications: Prior to Admission medications   Medication Sig Start Date End Date Taking? Authorizing Provider  acetaminophen (TYLENOL) 500 MG tablet Take 1,000 mg by mouth at bedtime.    [provider]  apixaban (ELIQUIS) 5 MG TABS tablet TAKE (1) TABLET BY MOUTH TWICE DAILY. 01/09/23   Antoine Poche, MD  aspirin EC 81 MG tablet Take 1 tablet (81 mg total) by mouth daily. Swallow whole. 09/26/22   Mazel Villela, Thressa Sheller, MD  atorvastatin (LIPITOR) 80 MG tablet Take 1 tablet (80 mg total) by  mouth daily. Patient taking differently: Take 80 mg by mouth at bedtime. 07/15/18   Angiulli, Mcarthur Rossetti, PA-C  azelastine (ASTELIN) 0.1 % nasal spray Place 1 spray into both nostrils at bedtime. 11/06/20   [provider]  CALCIUM CITRATE PO Take 200 mg by mouth daily.    [provider]  carvedilol (COREG) 6.25 MG tablet TAKE (1) TABLET BY MOUTH TWICE DAILY WITH A MEAL. 12/10/22   Marguerita Merles, Reuel Boom, MD  Cholecalciferol (VITAMIN D3) 50 MCG (2000 UT) TABS Take 2,000 Units by mouth daily.    [provider]  Coenzyme Q10 (CO Q-10) 100 MG CAPS Take 100 mg by mouth in the morning.    [provider]  diltiazem (CARDIZEM CD) 180 MG 24 hr capsule Take 180 mg by mouth daily. 05/13/22   [provider]  doxepin (SINEQUAN) 25 MG capsule Take 25 mg by mouth at bedtime. 06/24/22   [provider]  furosemide (LASIX) 40 MG tablet Take 0.5 tablets (20 mg total) by mouth daily. TAKE (1) TABLET BY MOUTH ONCE DAILY. MAY TAKE ADDITIONAL HALF IF NEEDED FOR SEVERE SWELLING. Strength: 20 mg Patient taking differently: Take 20-40 mg by mouth See admin instructions. Alternate taking 20 mg one day and 40 mg the next 04/14/22   Vassie Loll, MD  lactulose [facility]) 10 GM/15ML solution  08/21/22   [provider]  Multiple Vitamin (MULTIVITAMIN WITH MINERALS) TABS tablet Take 1 tablet by mouth daily.    [provider]  nitroGLYCERIN (NITROSTAT) 0.4 MG SL tablet Place 0.4 mg under the tongue every 5 (five) minutes as needed for chest pain. 08/22/20   [provider]  omeprazole (PRILOSEC) 40 MG capsule Take 1 capsule (40 mg total) by mouth daily. 12/30/21   Carlan, Chelsea L, NP  ondansetron (ZOFRAN-ODT) 4 MG disintegrating tablet Take 4 mg by mouth every 8 (eight) hours as needed for nausea or vomiting.    [provider]  potassium chloride SA (KLOR-CON M) 20 MEQ tablet Take 20 mEq by mouth 2 (two) times daily. 04/19/22   [provider]     Family History  Problem Relation Age of Onset   Diabetes Mother    Heart failure Father    Heart failure Maternal Aunt     Social History   Socioeconomic History   Marital status: Widowed    Spouse name: Not on file   Number of children: 2   Years of education: 2 years college   Highest education level: Some  college, no degree  Occupational History    Employer: RETIRED  Tobacco Use   Smoking status: Former    Packs/day: 0.25    Years: 50.00    Additional pack years: 0.00    Total pack years: 12.50    Types: Cigarettes    Quit date: 07/09/2022    Years since quitting: 0.5    Passive exposure: Current   Smokeless tobacco: Never   Tobacco comments:    12/23/21 1 pack Q two weeks.  Vaping Use   Vaping Use: Never used  Substance and Sexual Activity   Alcohol use: No    Alcohol/week: 0.0 standard drinks of alcohol   Drug use: No   Sexual activity: Not Currently    Birth control/protection: Surgical  Other Topics Concern   Not on file  Social History Narrative   12/23/21  Lives with 3 grandchildren, sone and dgtr-in -law are across the st   Social Determinants of Health   Financial Resource Strain: Low Risk  (09/15/2022)   Overall Financial Resource Strain (CARDIA)    Difficulty of Paying Living Expenses: Not hard at all  Food Insecurity: No Food Insecurity (09/26/2021)   Hunger Vital Sign    Worried About Running Out of Food in the Last Year: Never true    Ran Out of Food in the Last Year: Never true  Transportation Needs: No Transportation Needs (09/15/2022)   PRAPARE - Administrator, Civil Service (Medical): No    Lack of Transportation (Non-Medical): No  Physical Activity: Inactive (03/16/2019)   Exercise Vital Sign    Days of Exercise per Week: 0 days    Minutes of Exercise per Session: 0 min  Stress: No Stress Concern Present (03/16/2019)   [employer] of Occupational Health - Occupational Stress Questionnaire    Feeling of  Stress : Not at all  Social Connections: Moderately Isolated (03/16/2019)   Social Connection and Isolation Panel [NHANES]    Frequency of Communication with Friends and Family: More than three times a week    Frequency of Social Gatherings with Friends and Family: Never    Attends Religious Services: 1 to 4 times per year    Active Member of [employer] or Organizations: No    Attends Banker Meetings: Never    Marital Status: Widowed     Vital Signs: There were no vitals taken for this visit.  No physical examination was performed in lieu of virtual telephone clinic visit.   Imaging: MR Abdomen 07/24/22    [facility] beam CT from hepatic angiogram 09/26/22    MR abdomen 01/27/23  LIRADS TR nonviable.  No new masses.  Labs:  CBC: Recent Labs    08/14/22 1028 09/26/22 0756 10/10/22 0832 12/25/22 1308  WBC 6.1 6.3 6.5 4.3  HGB 13.0 13.6 12.4 12.5  HCT 39.2 41.8 38.5 37.6  PLT 143* 148* 136* 132*    COAGS: Recent Labs    04/12/22 1300 08/14/22 1028 09/26/22 0756 10/10/22 0832  INR 2.2* 1.3* 1.3* 1.6*  APTT  --   --  33  --     BMP: Recent Labs    08/14/22 1028 09/26/22 0756 10/10/22 0832 12/25/22 1308  NA 137 138 138 137  K 3.7 4.2 4.9 3.8  CL 107 104 109 106  CO2 23 25 19* 24  GLUCOSE 111* 148* 132* 190*  BUN 15 14 13 12   CALCIUM 9.2 9.3 9.1 8.8*  CREATININE 0.98 1.33* 1.02* 0.95  GFRNONAA 58*  40* 56* >60    LIVER FUNCTION TESTS: Recent Labs    08/14/22 1028 09/26/22 0756 10/10/22 0832 12/25/22 1308  BILITOT 1.5* 1.2 1.6* 1.0  AST 53* 62* 44* 39  ALT 36 46* 24 23  ALKPHOS 153* 149* 132* 162*  PROT 6.2* 6.7 6.2* 6.2*  ALBUMIN 3.0* 3.2* 3.0* 3.0*   AFP 07/01/22 203 12/25/22 165   Assessment and Plan: "Tommie" Arpin is an 82 year old female with NASH cirrhosis (Child Pugh B, ALBI 2) and biopsy proven unifocal left lobe hepatocellular carcinoma, possibly scirrhous subtype. She is now status post Tc-MAA mapping study with celiac artery  stent placement on 09/26/22 followed by radiation segmentectomy to segment II mass on 10/10/22.     She has recovered very well since the procedure without any significant symptoms or complaints.  Initial follow up MRI demonstrates non-viability of the treated mass with significant decrease in size.   Plan for MRI abdomen with contrast in late August (3 months) with clinic follow up shortly thereafter.   Electronically Signed: Bennie Dallas 02/10/2023, 9:09 AM   I spent a total of 25 Minutes in virtual telephone clinical consultation, greater than 50% of which was counseling/coordinating care for hepatocellular carcinoma.

## 2023-02-17 ENCOUNTER — Ambulatory Visit (INDEPENDENT_AMBULATORY_CARE_PROVIDER_SITE_OTHER): Payer: Medicare Other

## 2023-02-17 DIAGNOSIS — I639 Cerebral infarction, unspecified: Secondary | ICD-10-CM | POA: Diagnosis not present

## 2023-02-17 LAB — CUP PACEART REMOTE DEVICE CHECK
Date Time Interrogation Session: 20240611001846
Implantable Pulse Generator Implant Date: 20210602

## 2023-03-17 NOTE — Progress Notes (Signed)
Carelink Summary Report / Loop Recorder 

## 2023-03-20 ENCOUNTER — Other Ambulatory Visit: Payer: Self-pay | Admitting: Cardiology

## 2023-03-23 ENCOUNTER — Ambulatory Visit (INDEPENDENT_AMBULATORY_CARE_PROVIDER_SITE_OTHER): Payer: Medicare Other

## 2023-03-23 DIAGNOSIS — I639 Cerebral infarction, unspecified: Secondary | ICD-10-CM

## 2023-03-24 LAB — CUP PACEART REMOTE DEVICE CHECK
Date Time Interrogation Session: 20240714002258
Implantable Pulse Generator Implant Date: 20210602

## 2023-04-08 NOTE — Progress Notes (Signed)
Carelink Summary Report / Loop Recorder 

## 2023-04-15 ENCOUNTER — Other Ambulatory Visit (INDEPENDENT_AMBULATORY_CARE_PROVIDER_SITE_OTHER): Payer: Self-pay | Admitting: Gastroenterology

## 2023-04-17 ENCOUNTER — Other Ambulatory Visit: Payer: Self-pay | Admitting: Interventional Radiology

## 2023-04-17 DIAGNOSIS — C220 Liver cell carcinoma: Secondary | ICD-10-CM

## 2023-04-27 ENCOUNTER — Ambulatory Visit (INDEPENDENT_AMBULATORY_CARE_PROVIDER_SITE_OTHER): Payer: Medicare Other

## 2023-04-27 DIAGNOSIS — I639 Cerebral infarction, unspecified: Secondary | ICD-10-CM

## 2023-04-27 LAB — CUP PACEART REMOTE DEVICE CHECK
Date Time Interrogation Session: 20240818230258
Implantable Pulse Generator Implant Date: 20210602

## 2023-05-04 ENCOUNTER — Encounter (INDEPENDENT_AMBULATORY_CARE_PROVIDER_SITE_OTHER): Payer: Self-pay | Admitting: Gastroenterology

## 2023-05-04 ENCOUNTER — Ambulatory Visit (INDEPENDENT_AMBULATORY_CARE_PROVIDER_SITE_OTHER): Payer: Medicare Other | Admitting: Gastroenterology

## 2023-05-04 VITALS — BP 135/63 | HR 94 | Temp 97.3°F | Ht 63.0 in | Wt 128.3 lb

## 2023-05-04 DIAGNOSIS — I8510 Secondary esophageal varices without bleeding: Secondary | ICD-10-CM | POA: Diagnosis not present

## 2023-05-04 DIAGNOSIS — K7682 Hepatic encephalopathy: Secondary | ICD-10-CM

## 2023-05-04 DIAGNOSIS — C220 Liver cell carcinoma: Secondary | ICD-10-CM | POA: Diagnosis not present

## 2023-05-04 DIAGNOSIS — K7460 Unspecified cirrhosis of liver: Secondary | ICD-10-CM

## 2023-05-04 NOTE — Patient Instructions (Addendum)
Proceed with the scheduled liver MRI on 05/08/2023 and follow-up with Dr. Elby Showers Follow with Dr. Ellin Saba regarding cancer Titrate lactulose to achieve 2-4 bowel movements per day Perform blood workup - Reduce salt intake to <2 g per day - Can take Tylenol max of 2 g per day (650 mg q8h) for pain - Avoid NSAIDs for pain - Avoid eating raw oysters/shellfish - Protein shake (Ensure or Boost) every night before going to sleep

## 2023-05-04 NOTE — Progress Notes (Unsigned)
Katrinka Blazing, M.D. Gastroenterology & Hepatology [facility] [address] [place], [place] [address]  Primary Care Physician: Elfredia Nevins, MD [address] [place] [address]  I will communicate my assessment and recommendations to the referring MD via EMR.  Problems: NASH cirrhosis Well-differentiated HCC s/p Y90 radiation segmentectomy on 10/10/2022 G1 EV Grade 1 hepatic encephalopathy   History of Present Illness: [name] is a 82 y.o. female with past medical history of Elita Boone cirrhosis complicated by esophageal varices status post banding, grade 1 hepatic encephalopathy and HCC, CVA, A-fib, anxiety, coronary artery disease status post stent placement, depression, IBS, seizures and possible dementia, who presents for follow up of cirrhosis and HCC.  The patient was last seen on 11/03/22. At that time, the patient was continued on lactulose for hepatic encephalopathy and furosemide 40 mg every day for ascites.  Last time seen by Dr. Ellin Saba on 01/01/2023, was scheduled to have an MRI of the liver which was performed on 01/27/2023 -the lesion located at the hepatic segment II/III did not show any contrast-enhancement consistent with a nonviable LI-RADS lesion but limited due to artifact.  She was seen subsequently by Dr. Elby Showers on 02/10/2023 who recommended repeat MRI of the liver which is scheduled to be performed on 05/08/2023.  The patient states feeling well and denies having complaints. States had an episode of nausea for which she had to take some Zofran, which helped her symptoms.  Denies having any vomiting, fever, chills, hematochezia, melena, hematemesis, abdominal distention, abdominal pain, diarrhea, jaundice, pruritus .  Has not noticed any recent weight loss but she has been weak and had previous significant weight loss in the past.  Cirrhosis related questions: Hematemesis/coffee ground emesis: No Abdominal pain:  No Abdominal distention/worsening ascites No Fever/chills: No Episodes of confusion/disorientation: Is forgetful sometimes, chronically Number of daily bowel movements:3-4, takes lactulose once a day Taking diuretics?: Yes, furosemide 20 mg/day History of variceal bleeding: No Prior history of banding?: Yes Currently on Carvedilol  6.25 mg BID Prior episodes of SBP: No Last time liver imaging was performed:MRI - 07/24/2022 - LR-M lesion in the left hepatic lobe measuring 4.8 x 2.5 cm.  Last AFP on 12/25/2022 was 165. MELD score: 10/10/22  - 15   Last Colonoscopy:>10 years ago, does not wish to repeat colonoscopy  Last Endoscopy: 08/19/2022 which showed presence of grade 1 esophageal varices, 3 cm hiatal hernia, normal stomach and duodenum.   Past Medical History: Past Medical History:  Diagnosis Date   Anemia    Anxiety    Aortic insufficiency    Moderate   Atrial fibrillation (HCC)    Back pain    Carotid stenosis, bilateral    Cirrhosis (HCC)    Closed left femoral fracture (HCC) 04/21/2017   Coronary artery disease    Stent x 2 RCA 1995, cardiac catheterization 09/2016 showing only mild atherosclerosis   DDD (degenerative disc disease), lumbar    Depression    Diabetes mellitus without complication (HCC)    Essential hypertension    Falls    GERD (gastroesophageal reflux disease)    Hiatal hernia    History of stroke    Hypercholesteremia    IBS (irritable bowel syndrome)    Non-alcoholic fatty liver disease    OSA (obstructive sleep apnea)    no CPAP   Pericardial effusion    a. small by echo 2018.   PSVT (paroxysmal supraventricular tachycardia)    Recurrent UTI    Seizures (  HCC) 2018   Stroke (HCC) 07/2019   several strokes within the last 10 years   Swallowing difficulty    Varices, esophageal (HCC)     Past Surgical History: Past Surgical History:  Procedure Laterality Date   ANAL FISSURE REPAIR  1972   APPENDECTOMY     BACK SURGERY  1985   CARDIAC  CATHETERIZATION  [phone]   Stent to the proximal RCA after MI    CARDIAC CATHETERIZATION N/A 09/26/2016   Procedure: Left Heart Cath and Coronary Angiography;  Surgeon: Marykay Lex, MD;  Location: [facility] INVASIVE CV LAB;  Service: Cardiovascular;  Laterality: N/A;   CATARACT EXTRACTION W/PHACO Right 07/04/2014   Procedure: CATARACT EXTRACTION PHACO AND INTRAOCULAR LENS PLACEMENT (IOC);  Surgeon: Loraine Leriche T. Nile Riggs, MD;  Location: [facility] OR[facility];  Service: Ophthalmology;  Laterality: Right;  CDE:13.13   COLONOSCOPY     DILATION AND CURETTAGE OF UTERUS     x2   Epi Retinal Membrane Peel Left    ERCP     ESOPHAGEAL BANDING N/A 04/01/2013   Procedure: ESOPHAGEAL BANDING;  Surgeon: Malissa Hippo, MD;  Location: [facility] END[facility] SU[facility];  Service: Endoscopy;  Laterality: N/A;   ESOPHAGEAL BANDING N/A 05/24/2013   Procedure: ESOPHAGEAL BANDING;  Surgeon: Malissa Hippo, MD;  Location: [facility];  Service: Endoscopy;  Laterality: N/A;   ESOPHAGEAL BANDING N/A 06/21/2014   Procedure: ESOPHAGEAL BANDING;  Surgeon: Malissa Hippo, MD;  Location: [facility];  Service: Endoscopy;  Laterality: N/A;   ESOPHAGEAL BANDING  06/15/2020   Procedure: ESOPHAGEAL BANDING;  Surgeon: Dolores Frame, MD;  Location: [facility];  Service: Gastroenterology;;   ESOPHAGOGASTRODUODENOSCOPY N/A 04/01/2013   Procedure: ESOPHAGOGASTRODUODENOSCOPY (EGD);  Surgeon: Malissa Hippo, MD;  Location: [facility];  Service: Endoscopy;  Laterality: N/A;  230-rescheduled to 8:30am Ann notified pt   ESOPHAGOGASTRODUODENOSCOPY N/A 05/24/2013   Procedure: ESOPHAGOGASTRODUODENOSCOPY (EGD);  Surgeon: Malissa Hippo, MD;  [facility]: [facility];  Service: Endoscopy;  Laterality: N/A;  730   ESOPHAGOGASTRODUODENOSCOPY N/A 06/21/2014   Procedure: ESOPHAGOGASTRODUODENOSCOPY (EGD);  Surgeon: Malissa Hippo, MD;  [facility]: [facility];  Service: Endoscopy;  Laterality: N/A;  930-rescheduled 10/14 @ 1200 Ann to notify pt    ESOPHAGOGASTRODUODENOSCOPY (EGD) WITH PROPOFOL N/A 06/15/2020   Procedure: ESOPHAGOGASTRODUODENOSCOPY (EGD) WITH PROPOFOL;  Surgeon: Dolores Frame, MD;  [facility]: [facility];  Service: Gastroenterology;  Laterality: N/A;  230   ESOPHAGOGASTRODUODENOSCOPY (EGD) WITH PROPOFOL N/A 07/27/2020   Procedure: ESOPHAGOGASTRODUODENOSCOPY (EGD) WITH PROPOFOL;  Surgeon: Dolores Frame, MD;  [facility]: [facility];  Service: Gastroenterology;  Laterality: N/A;  1245   ESOPHAGOGASTRODUODENOSCOPY (EGD) WITH PROPOFOL N/A 08/19/2022   Procedure: ESOPHAGOGASTRODUODENOSCOPY (EGD) WITH PROPOFOL;  Surgeon: Dolores Frame, MD;  Location: [facility];  Service: Gastroenterology;  Laterality: N/A;  130 ASA 3, pt knows to arrive at 6:15   EYE SURGERY  08   cataract surgery of the left eye   FEMUR IM NAIL Left 03/02/2017   Procedure: INTRAMEDULLARY (IM) NAIL FEMORAL;  Surgeon: Samson Frederic, MD;  Location: [facility] OR;  Service: Orthopedics;  Laterality: Left;   HARDWARE REMOVAL Right 01/17/2013   Procedure: REMOVAL OF HARDWARE AND EXCISION ULNAR STYLOID RIGHT WRIST;  Surgeon: Tami Ribas, MD;  Location: [facility];  Service: Orthopedics;  Laterality: Right;   implanted heart monitor  02/2020   to monitor Afib   IR 3D INDEPENDENT WKST  09/26/2022   IR ANGIOGRAM SELECTIVE EACH ADDITIONAL VESSEL  09/26/2022   IR ANGIOGRAM SELECTIVE EACH ADDITIONAL VESSEL  09/26/2022   IR ANGIOGRAM SELECTIVE EACH ADDITIONAL VESSEL  09/26/2022   IR ANGIOGRAM SELECTIVE EACH ADDITIONAL VESSEL  09/26/2022   IR ANGIOGRAM SELECTIVE EACH ADDITIONAL VESSEL  09/26/2022   IR ANGIOGRAM SELECTIVE EACH ADDITIONAL VESSEL  10/10/2022   IR ANGIOGRAM SELECTIVE EACH ADDITIONAL VESSEL  10/10/2022   IR ANGIOGRAM SELECTIVE EACH ADDITIONAL VESSEL  10/10/2022   IR ANGIOGRAM VISCERAL SELECTIVE  09/26/2022   IR ANGIOGRAM VISCERAL SELECTIVE  10/10/2022   IR EMBO TUMOR ORGAN ISCHEMIA INFARCT INC GUIDE ROADMAPPING  10/10/2022   IR  INTRAVASCULAR ULTRASOUND NON CORONARY  09/26/2022   IR RADIOLOGIST EVAL & MGMT  09/10/2022   IR RADIOLOGIST EVAL & MGMT  11/03/2022   IR RADIOLOGIST EVAL & MGMT  02/10/2023   IR TRANSCATH PLC STENT 1ST ART NOT LE CV CAR VERT CAR  09/26/2022   IR US GUIDE VASC ACCESS RIGHT  09/26/2022   IR US GUIDE VASC ACCESS RIGHT  10/10/2022   MYRINGOTOMY  2012   both ears   ORIF FEMUR FRACTURE Left 04/23/2017   Procedure: OPEN REDUCTION INTERNAL FIXATION (ORIF) DISTAL FEMUR FRACTURE (FRACTURE AROUND FEMORAL NAIL);  Surgeon: Myrene Galas, MD;  Location: [facility];  Service: Orthopedics;  Laterality: Left;   TONSILLECTOMY     TUBAL LIGATION     VAGINAL HYSTERECTOMY  1972   WRIST SURGERY     rt wrist hardwear removal    Family History: Family History  Problem Relation Age of Onset   Diabetes Mother    Heart failure Father    Heart failure Maternal Aunt     Social History: Social History   Tobacco Use  Smoking Status Former   Current packs/day: 0.00   Average packs/day: 0.3 packs/day for 50.0 years (12.5 ttl pk-yrs)   Types: Cigarettes   Start date: 07/09/1972   Quit date: 07/09/2022   Years since quitting: 0.8   Passive exposure: Current  Smokeless Tobacco Never  Tobacco Comments   12/23/21 1 pack Q two weeks.   Social History   Substance and Sexual Activity  Alcohol Use No   Alcohol/week: 0.0 standard drinks of alcohol   Social History   Substance and Sexual Activity  Drug Use No    Allergies: Allergies  Allergen Reactions   Tape Rash and Other (See Comments)    Old paper tape caused a reaction, doesn't react to most other tapes     Medications: Current Outpatient Medications  Medication Sig Dispense Refill   acetaminophen (TYLENOL) 500 MG tablet Take 1,000 mg by mouth at bedtime.     apixaban (ELIQUIS) 5 MG TABS tablet TAKE (1) TABLET BY MOUTH TWICE DAILY. 60 tablet 5   aspirin EC 81 MG tablet Take 1 tablet (81 mg total) by mouth daily. Swallow whole. 30 tablet 12   atorvastatin  (LIPITOR) 80 MG tablet Take 1 tablet (80 mg total) by mouth daily. (Patient taking differently: Take 80 mg by mouth at bedtime.) 30 tablet 1   azelastine (ASTELIN) 0.1 % nasal spray Place 1 spray into both nostrils at bedtime.     CALCIUM CITRATE PO Take 200 mg by mouth daily.     carvedilol (COREG) 6.25 MG tablet TAKE (1) TABLET BY MOUTH TWICE DAILY WITH A MEAL. 180 tablet 3   Cholecalciferol (VITAMIN D3) 50 MCG (2000 UT) TABS Take 2,000 Units by mouth daily.     Coenzyme Q10 (CO Q-10) 100 MG CAPS Take 100 mg by mouth in  the morning.     diltiazem (CARDIZEM CD) 180 MG 24 hr capsule TAKE (1) CAPSULE BY MOUTH ONCE DAILY. 90 capsule 1   doxepin (SINEQUAN) 25 MG capsule Take 25 mg by mouth at bedtime.     furosemide (LASIX) 40 MG tablet Take 0.5 tablets (20 mg total) by mouth daily. TAKE (1) TABLET BY MOUTH ONCE DAILY. MAY TAKE ADDITIONAL HALF IF NEEDED FOR SEVERE SWELLING. Strength: 20 mg (Patient taking differently: Take 20-40 mg by mouth See admin instructions. Alternate taking 20 mg one day and 40 mg the next) 45 tablet 0   lactulose (CHRONULAC) 10 GM/15ML solution Take 30 g by mouth daily at 6 (six) AM.     Multiple Vitamin (MULTIVITAMIN WITH MINERALS) TABS tablet Take 1 tablet by mouth daily.     nitroGLYCERIN (NITROSTAT) 0.4 MG SL tablet Place 0.4 mg under the tongue every 5 (five) minutes as needed for chest pain.     omeprazole (PRILOSEC) 40 MG capsule TAKE (1) CAPSULE BY MOUTH ONCE DAILY. 90 capsule 0   ondansetron (ZOFRAN-ODT) 4 MG disintegrating tablet Take 4 mg by mouth every 8 (eight) hours as needed for nausea or vomiting.     potassium chloride SA (KLOR-CON M) 20 MEQ tablet Take 20 mEq by mouth 2 (two) times daily.     No current facility-administered medications for this visit.   Facility-Administered Medications Ordered in Other Visits  Medication Dose Route Frequency Provider Last Rate Last Admin   dexamethasone (DECADRON) injection 8 mg  8 mg Intravenous Once Boisseau, Hayley,  PA        Review of Systems: GENERAL: negative for malaise, night sweats HEENT: No changes in hearing or vision, no nose bleeds or other nasal problems. NECK: Negative for lumps, goiter, pain and significant neck swelling RESPIRATORY: Negative for cough, wheezing CARDIOVASCULAR: Negative for chest pain, leg swelling, palpitations, orthopnea GI: SEE HPI MUSCULOSKELETAL: Negative for joint pain or swelling, back pain, and muscle pain. SKIN: Negative for lesions, rash PSYCH: Negative for sleep disturbance, mood disorder and recent psychosocial stressors. HEMATOLOGY Negative for prolonged bleeding, bruising easily, and swollen nodes. ENDOCRINE: Negative for cold or heat intolerance, polyuria, polydipsia and goiter. NEURO: negative for tremor, gait imbalance, syncope and seizures. The remainder of the review of systems is noncontributory.   Physical Exam: BP 135/63 (BP Location: Left Arm, Patient Position: Sitting, Cuff Size: Normal)   Pulse 94   Temp (!) 97.3 F (36.3 C) (Temporal)   Ht 5\' 3"  (1.6 m)   Wt 128 lb 4.8 oz (58.2 kg)   BMI 22.73 kg/m  GENERAL: The patient is AO x3, in no acute distress. Frail, sitting in wheelchair. HEENT: Head is normocephalic and atraumatic. EOMI are intact. Mouth is well hydrated and without lesions. NECK: Supple. No masses LUNGS: Clear to auscultation. No presence of rhonchi/wheezing/rales. Adequate chest expansion HEART: RRR, normal s1 and s2. ABDOMEN: Soft, nontender, no guarding, no peritoneal signs, and nondistended. BS +. No masses. EXTREMITIES: Without any cyanosis, clubbing, rash, lesions or edema. NEUROLOGIC: AOx3, no focal motor deficit. SKIN: no jaundice, no rashes  Imaging/Labs: as above  I personally reviewed and interpreted the available labs, imaging and endoscopic files.  Impression and Plan: [name] is a 82 y.o. female with past medical history of Elita Boone cirrhosis complicated by esophageal varices status post banding, grade  1 hepatic encephalopathy and HCC, CVA, A-fib, anxiety, coronary artery disease status post stent placement, depression, IBS, seizures and possible dementia, who presents for follow up of cirrhosis  and HCC.  Patient has had a history of complex NASH cirrhosis complicated by [facility] that required Y90 segmentectomy.  Based on previous imaging, she had adequate response to this treatment.  Advised to follow-up with Dr. Elby Showers as scheduled as the upcoming MRI will help [place] evaluate the status of her disease.  Will check a repeat AFP today, as well as other MELD labs.  She should continue following with Dr. Ellin Saba regarding her [facility].  She is a poor candidate for surgical intervention.  Regarding her esophageal variceal screening, there is no need to  repeat EGD in the future given she is on Coreg twice daily.  She will need to have these repeated if she has any decompensating event.  Mental status is adequate without signs of encephalopathy, will need to continue with lactulose for now which was explained to the patient.  - Proceed with the scheduled liver MRI on 05/08/2023 and follow-up with Dr. Elby Showers - Follow with Dr. Ellin Saba regarding cancer - Titrate lactulose to achieve 2-4 bowel movements per day -Check MELD labs, CBC and AFP - Reduce salt intake to <2 g per day - Can take Tylenol max of 2 g per day (650 mg q8h) for pain - Avoid NSAIDs for pain - Avoid eating raw oysters/shellfish - Protein shake (Ensure or Boost) every night before going to sleep  All questions were answered.      Katrinka Blazing, MD Gastroenterology and Hepatology [facility]

## 2023-05-05 LAB — CBC WITH DIFFERENTIAL/PLATELET
Absolute Monocytes: 456 {cells}/uL (ref 200–950)
Basophils Absolute: 18 {cells}/uL (ref 0–200)
Basophils Relative: 0.3 %
Eosinophils Absolute: 336 {cells}/uL (ref 15–500)
Eosinophils Relative: 5.6 %
HCT: 38.5 % (ref 35.0–45.0)
Hemoglobin: 12.5 g/dL (ref 11.7–15.5)
Lymphs Abs: 936 {cells}/uL (ref 850–3900)
MCH: 31.3 pg (ref 27.0–33.0)
MCHC: 32.5 g/dL (ref 32.0–36.0)
MCV: 96.3 fL (ref 80.0–100.0)
MPV: 10.6 fL (ref 7.5–12.5)
Monocytes Relative: 7.6 %
Neutro Abs: 4254 cells/uL (ref 1500–7800)
Neutrophils Relative %: 70.9 %
Platelets: 153 10*3/uL (ref 140–400)
RBC: 4 10*6/uL (ref 3.80–5.10)
RDW: 13 % (ref 11.0–15.0)
Total Lymphocyte: 15.6 %
WBC: 6 10*3/uL (ref 3.8–10.8)

## 2023-05-05 LAB — COMPREHENSIVE METABOLIC PANEL
AG Ratio: 1.3 (calc) (ref 1.0–2.5)
ALT: 25 U/L (ref 6–29)
AST: 52 U/L — ABNORMAL HIGH (ref 10–35)
Albumin: 3 g/dL — ABNORMAL LOW (ref 3.6–5.1)
Alkaline phosphatase (APISO): 187 U/L — ABNORMAL HIGH (ref 37–153)
BUN/Creatinine Ratio: 12 (calc) (ref 6–22)
BUN: 12 mg/dL (ref 7–25)
CO2: 25 mmol/L (ref 20–32)
Calcium: 8.6 mg/dL (ref 8.6–10.4)
Chloride: 103 mmol/L (ref 98–110)
Creat: 0.99 mg/dL — ABNORMAL HIGH (ref 0.60–0.95)
Globulin: 2.3 g/dL (calc) (ref 1.9–3.7)
Glucose, Bld: 288 mg/dL — ABNORMAL HIGH (ref 65–99)
Potassium: 4.2 mmol/L (ref 3.5–5.3)
Sodium: 140 mmol/L (ref 135–146)
Total Bilirubin: 1.5 mg/dL — ABNORMAL HIGH (ref 0.2–1.2)
Total Protein: 5.3 g/dL — ABNORMAL LOW (ref 6.1–8.1)

## 2023-05-05 LAB — AFP TUMOR MARKER: AFP-Tumor Marker: 1528 ng/mL — ABNORMAL HIGH

## 2023-05-05 LAB — PROTIME-INR
INR: 1.3 — ABNORMAL HIGH
Prothrombin Time: 13.5 s — ABNORMAL HIGH (ref 9.0–11.5)

## 2023-05-08 ENCOUNTER — Ambulatory Visit (HOSPITAL_COMMUNITY)
Admission: RE | Admit: 2023-05-08 | Discharge: 2023-05-08 | Disposition: A | Discharge status: Home / Self Care | Payer: Medicare Other | Source: Ambulatory Visit | Attending: Interventional Radiology | Admitting: Interventional Radiology

## 2023-05-08 DIAGNOSIS — K769 Liver disease, unspecified: Secondary | ICD-10-CM | POA: Diagnosis not present

## 2023-05-08 DIAGNOSIS — K8020 Calculus of gallbladder without cholecystitis without obstruction: Secondary | ICD-10-CM | POA: Diagnosis not present

## 2023-05-08 DIAGNOSIS — C220 Liver cell carcinoma: Secondary | ICD-10-CM | POA: Diagnosis not present

## 2023-05-08 DIAGNOSIS — K7460 Unspecified cirrhosis of liver: Secondary | ICD-10-CM | POA: Diagnosis not present

## 2023-05-08 MED ORDER — GADOBUTROL 1 MMOL/ML IV SOLN: 6.0000 mL | Freq: Once | INTRAVENOUS | Status: DC | PRN

## 2023-05-08 MED ORDER — GADOBUTROL 1 MMOL/ML IV SOLN
6.0000 mL | Freq: Once | INTRAVENOUS | Status: AC | PRN
  Administered 2023-05-08: 6 mL via INTRAVENOUS

## 2023-05-08 NOTE — Progress Notes (Signed)
Carelink Summary Report / Loop Recorder 

## 2023-05-15 ENCOUNTER — Ambulatory Visit
Admission: RE | Admit: 2023-05-15 | Discharge: 2023-05-15 | Disposition: A | Discharge status: Home / Self Care | Payer: Medicare Other | Source: Ambulatory Visit | Attending: Interventional Radiology | Admitting: Interventional Radiology

## 2023-05-15 DIAGNOSIS — Z95828 Presence of other vascular implants and grafts: Secondary | ICD-10-CM | POA: Diagnosis not present

## 2023-05-15 DIAGNOSIS — C220 Liver cell carcinoma: Secondary | ICD-10-CM | POA: Diagnosis not present

## 2023-05-15 HISTORY — PX: IR RADIOLOGIST EVAL & MGMT: IMG5224

## 2023-05-15 NOTE — Progress Notes (Signed)
Referring Physician(s): Levon Hedger [name],D/[name],S    Reason for follow up: The patient is seen in follow up today via virtual telephone clinic visit s/p segment II radiation segmentectomy on 10/10/22   History of present illness: Initial HPI from 09/10/22:  [name] is a 82 y.o. female with history of recent incidentally discovered and now biopsy proven left lobe unifocal hepatocellular carcinoma.  She presents today via virtual telephone visit accompanied by her grand daughter, [name] [phone]).     [name] was admitted to the hospital in August 2023 for sepsis/UTI at which time a CT was performed which demonstrated cirrhosis and a concerning 4.5 cm left lobe liver mass. This prompted further evaluation with MRI which demonstrated a 4.8 cm segment II LR-M mass.  Ultrasound guided biopsy was performed on 08/15/22 which confirmed hepatocellular carcinoma.     She underwent Tc-MAA mapping study on 09/26/22 which required celiac artery stent placement for catheterization of the hepatic artery.  This was followed by transarterial radioembolization to the segment II branches supplying the biopsy proven HCC on 10/10/22.  The procedure went as planned without complication.    She continues to follow with Dr. Levon Hedger.  She had an MRI done on 05/08/23.     Overall doing well.  Good appetite.  She is able to ambulate.  She remains compliant with lactulose without reported encephalopathy.   Past Medical History:  Diagnosis Date   Anemia    Anxiety    Aortic insufficiency    Moderate   Atrial fibrillation (HCC)    Back pain    Carotid stenosis, bilateral    Cirrhosis (HCC)    Closed left femoral fracture (HCC) 04/21/2017   Coronary artery disease    Stent x 2 RCA 1995, cardiac catheterization 09/2016 showing only mild atherosclerosis   DDD (degenerative disc disease), lumbar    Depression    Diabetes mellitus without complication (HCC)    Essential hypertension    Falls     GERD (gastroesophageal reflux disease)    Hiatal hernia    History of stroke    Hypercholesteremia    IBS (irritable bowel syndrome)    Non-alcoholic fatty liver disease    OSA (obstructive sleep apnea)    no CPAP   Pericardial effusion    a. small by echo 2018.   PSVT (paroxysmal supraventricular tachycardia)    Recurrent UTI    Seizures (HCC) 2018   Stroke (HCC) 07/2019   several strokes within the last 10 years   Swallowing difficulty    Varices, esophageal (HCC)     Past Surgical History:  Procedure Laterality Date   ANAL FISSURE REPAIR  1972   APPENDECTOMY     BACK SURGERY  1985   CARDIAC CATHETERIZATION  [phone]   Stent to the proximal RCA after MI    CARDIAC CATHETERIZATION N/A 09/26/2016   Procedure: Left Heart Cath and Coronary Angiography;  Surgeon: Marykay Lex, MD;  Location: [facility] INVASIVE CV LAB;  Service: Cardiovascular;  Laterality: N/A;   CATARACT EXTRACTION W/PHACO Right 07/04/2014   Procedure: CATARACT EXTRACTION PHACO AND INTRAOCULAR LENS PLACEMENT (IOC);  Surgeon: Loraine Leriche T. Nile Riggs, MD;  Location: [facility];  Service: Ophthalmology;  Laterality: Right;  CDE:13.13   COLONOSCOPY     DILATION AND CURETTAGE OF UTERUS     x2   Epi Retinal Membrane Peel Left    ERCP     ESOPHAGEAL BANDING N/A 04/01/2013   Procedure: ESOPHAGEAL BANDING;  Surgeon: Joline Maxcy  Karilyn Cota, MD;  Location: [facility];  Service: Endoscopy;  Laterality: N/A;   ESOPHAGEAL BANDING N/A 05/24/2013   Procedure: ESOPHAGEAL BANDING;  Surgeon: Malissa Hippo, MD;  Location: [facility];  Service: Endoscopy;  Laterality: N/A;   ESOPHAGEAL BANDING N/A 06/21/2014   Procedure: ESOPHAGEAL BANDING;  Surgeon: Malissa Hippo, MD;  [facility]: [facility];  Service: Endoscopy;  Laterality: N/A;   ESOPHAGEAL BANDING  06/15/2020   Procedure: ESOPHAGEAL BANDING;  Surgeon: Dolores Frame, MD;  Location: [facility];  Service: Gastroenterology;;   ESOPHAGOGASTRODUODENOSCOPY N/A 04/01/2013    Procedure: ESOPHAGOGASTRODUODENOSCOPY (EGD);  Surgeon: Malissa Hippo, MD;  [facility]: [facility];  Service: Endoscopy;  Laterality: N/A;  230-rescheduled to 8:30am Ann notified pt   ESOPHAGOGASTRODUODENOSCOPY N/A 05/24/2013   Procedure: ESOPHAGOGASTRODUODENOSCOPY (EGD);  Surgeon: Malissa Hippo, MD;  [facility]: [facility];  Service: Endoscopy;  Laterality: N/A;  730   ESOPHAGOGASTRODUODENOSCOPY N/A 06/21/2014   Procedure: ESOPHAGOGASTRODUODENOSCOPY (EGD);  Surgeon: Malissa Hippo, MD;  [facility];  Service: Endoscopy;  Laterality: N/A;  930-rescheduled 10/14 @ 1200 Ann to notify pt   ESOPHAGOGASTRODUODENOSCOPY (EGD) WITH PROPOFOL N/A 06/15/2020   Procedure: ESOPHAGOGASTRODUODENOSCOPY (EGD) WITH PROPOFOL;  Surgeon: Dolores Frame, MD;  [facility];  Service: Gastroenterology;  Laterality: N/A;  230   ESOPHAGOGASTRODUODENOSCOPY (EGD) WITH PROPOFOL N/A 07/27/2020   Procedure: ESOPHAGOGASTRODUODENOSCOPY (EGD) WITH PROPOFOL;  Surgeon: Dolores Frame, MD;  [facility];  Service: Gastroenterology;  Laterality: N/A;  1245   ESOPHAGOGASTRODUODENOSCOPY (EGD) WITH PROPOFOL N/A 08/19/2022   Procedure: ESOPHAGOGASTRODUODENOSCOPY (EGD) WITH PROPOFOL;  Surgeon: Dolores Frame, MD;  [facility]: [facility];  Service: Gastroenterology;  Laterality: N/A;  130 ASA 3, pt knows to arrive at 6:15   EYE SURGERY  08   cataract surgery of the left eye   FEMUR IM NAIL Left 03/02/2017   Procedure: INTRAMEDULLARY (IM) NAIL FEMORAL;  Surgeon: Samson Frederic, MD;  Location: [facility];  Service: Orthopedics;  Laterality: Left;   HARDWARE REMOVAL Right 01/17/2013   Procedure: REMOVAL OF HARDWARE AND EXCISION ULNAR STYLOID RIGHT WRIST;  Surgeon: Tami Ribas, MD;  Location: [facility];  Service: Orthopedics;  Laterality: Right;   implanted heart monitor  02/2020   to monitor Afib   IR 3D INDEPENDENT WKST  09/26/2022   IR ANGIOGRAM SELECTIVE  EACH ADDITIONAL VESSEL  09/26/2022   IR ANGIOGRAM SELECTIVE EACH ADDITIONAL VESSEL  09/26/2022   IR ANGIOGRAM SELECTIVE EACH ADDITIONAL VESSEL  09/26/2022   IR ANGIOGRAM SELECTIVE EACH ADDITIONAL VESSEL  09/26/2022   IR ANGIOGRAM SELECTIVE EACH ADDITIONAL VESSEL  09/26/2022   IR ANGIOGRAM SELECTIVE EACH ADDITIONAL VESSEL  10/10/2022   IR ANGIOGRAM SELECTIVE EACH ADDITIONAL VESSEL  10/10/2022   IR ANGIOGRAM SELECTIVE EACH ADDITIONAL VESSEL  10/10/2022   IR ANGIOGRAM VISCERAL SELECTIVE  09/26/2022   IR ANGIOGRAM VISCERAL SELECTIVE  10/10/2022   IR EMBO TUMOR ORGAN ISCHEMIA INFARCT INC GUIDE ROADMAPPING  10/10/2022   IR INTRAVASCULAR ULTRASOUND NON CORONARY  09/26/2022   IR RADIOLOGIST EVAL & MGMT  09/10/2022   IR RADIOLOGIST EVAL & MGMT  11/03/2022   IR RADIOLOGIST EVAL & MGMT  02/10/2023   IR TRANSCATH PLC STENT 1ST ART NOT LE CV CAR VERT CAR  09/26/2022   IR US GUIDE VASC ACCESS RIGHT  09/26/2022   IR US GUIDE VASC ACCESS RIGHT  10/10/2022   MYRINGOTOMY  2012   both ears   ORIF FEMUR FRACTURE Left  04/23/2017   Procedure: OPEN REDUCTION INTERNAL FIXATION (ORIF) DISTAL FEMUR FRACTURE (FRACTURE AROUND FEMORAL NAIL);  Surgeon: Myrene Galas, MD;  Location: [facility] OR;  Service: Orthopedics;  Laterality: Left;   TONSILLECTOMY     TUBAL LIGATION     VAGINAL HYSTERECTOMY  1972   WRIST SURGERY     rt wrist hardwear removal    Allergies: Tape  Medications: Prior to Admission medications   Medication Sig Start Date End Date Taking? Authorizing Provider  acetaminophen (TYLENOL) 500 MG tablet Take 1,000 mg by mouth at bedtime.    [provider]  apixaban (ELIQUIS) 5 MG TABS tablet TAKE (1) TABLET BY MOUTH TWICE DAILY. 01/09/23   Antoine Poche, MD  aspirin EC 81 MG tablet Take 1 tablet (81 mg total) by mouth daily. Swallow whole. 09/26/22   Jeovanni Heuring, Thressa Sheller, MD  atorvastatin (LIPITOR) 80 MG tablet Take 1 tablet (80 mg total) by mouth daily. Patient taking differently: Take 80 mg by mouth at bedtime. 07/15/18    Angiulli, Mcarthur Rossetti, PA-C  azelastine (ASTELIN) 0.1 % nasal spray Place 1 spray into both nostrils at bedtime. 11/06/20   [provider]  CALCIUM CITRATE PO Take 200 mg by mouth daily.    [provider]  carvedilol (COREG) 6.25 MG tablet TAKE (1) TABLET BY MOUTH TWICE DAILY WITH A MEAL. 12/10/22   Marguerita Merles, Reuel Boom, MD  Cholecalciferol (VITAMIN D3) 50 MCG (2000 UT) TABS Take 2,000 Units by mouth daily.    [provider]  Coenzyme Q10 (CO Q-10) 100 MG CAPS Take 100 mg by mouth in the morning.    [provider]  diltiazem (CARDIZEM CD) 180 MG 24 hr capsule TAKE (1) CAPSULE BY MOUTH ONCE DAILY. 03/20/23   Antoine Poche, MD  doxepin (SINEQUAN) 25 MG capsule Take 25 mg by mouth at bedtime. 06/24/22   [provider]  furosemide (LASIX) 40 MG tablet Take 0.5 tablets (20 mg total) by mouth daily. TAKE (1) TABLET BY MOUTH ONCE DAILY. MAY TAKE ADDITIONAL HALF IF NEEDED FOR SEVERE SWELLING. Strength: 20 mg Patient taking differently: Take 20-40 mg by mouth See admin instructions. Alternate taking 20 mg one day and 40 mg the next 04/14/22   Vassie Loll, MD  lactulose [facility]) 10 GM/15ML solution Take 30 g by mouth daily at 6 (six) AM. 08/21/22   [provider]  Multiple Vitamin (MULTIVITAMIN WITH MINERALS) TABS tablet Take 1 tablet by mouth daily.    [provider]  nitroGLYCERIN (NITROSTAT) 0.4 MG SL tablet Place 0.4 mg under the tongue every 5 (five) minutes as needed for chest pain. 08/22/20   [provider]  omeprazole (PRILOSEC) 40 MG capsule TAKE (1) CAPSULE BY MOUTH ONCE DAILY. 04/15/23   Carlan, Chelsea L, NP  ondansetron (ZOFRAN-ODT) 4 MG disintegrating tablet Take 4 mg by mouth every 8 (eight) hours as needed for nausea or vomiting.    [provider]  potassium chloride SA (KLOR-CON M) 20 MEQ tablet Take 20 mEq by mouth 2 (two) times daily. 04/19/22   [provider]     Family History   Problem Relation Age of Onset   Diabetes Mother    Heart failure Father    Heart failure Maternal Aunt     Social History   Socioeconomic History   Marital status: Widowed    Spouse name: Not on file   Number of children: 2   Years of education: 2 years college   Highest education level:  Some college, no degree  Occupational History    Employer: RETIRED  Tobacco Use   Smoking status: Former    Current packs/day: 0.00    Average packs/day: 0.3 packs/day for 50.0 years (12.5 ttl pk-yrs)    Types: Cigarettes    Start date: 07/09/1972    Quit date: 07/09/2022    Years since quitting: 0.8    Passive exposure: Current   Smokeless tobacco: Never   Tobacco comments:    12/23/21 1 pack Q two weeks.  Vaping Use   Vaping status: Never Used  Substance and Sexual Activity   Alcohol use: No    Alcohol/week: 0.0 standard drinks of alcohol   Drug use: No   Sexual activity: Not Currently    Birth control/protection: Surgical  Other Topics Concern   Not on file  Social History Narrative   12/23/21  Lives with 3 grandchildren, sone and dgtr-in -law are across the st   Social Determinants of Health   Financial Resource Strain: Low Risk  (09/15/2022)   Overall Financial Resource Strain (CARDIA)    Difficulty of Paying Living Expenses: Not hard at all  Food Insecurity: No Food Insecurity (09/26/2021)   Hunger Vital Sign    Worried About Running Out of Food in the Last Year: Never true    Ran Out of Food in the Last Year: Never true  Transportation Needs: No Transportation Needs (09/15/2022)   PRAPARE - Administrator, Civil Service (Medical): No    Lack of Transportation (Non-Medical): No  Physical Activity: Inactive (03/16/2019)   Exercise Vital Sign    Days of Exercise per Week: 0 days    Minutes of Exercise per Session: 0 min  Stress: No Stress Concern Present (03/16/2019)   [employer] of Occupational Health - Occupational Stress Questionnaire    Feeling of Stress :  Not at all  Social Connections: Moderately Isolated (03/16/2019)   Social Connection and Isolation Panel [NHANES]    Frequency of Communication with Friends and Family: More than three times a week    Frequency of Social Gatherings with Friends and Family: Never    Attends Religious Services: 1 to 4 times per year    Active Member of [employer] or Organizations: No    Attends Banker Meetings: Never    Marital Status: Widowed     Vital Signs: There were no vitals taken for this visit.  No physical examination was performed in lieu of virtual telephone clinic visit.  Imaging: MR Abdomen 07/24/22    Cone beam CT from hepatic angiogram 09/26/22    MR abdomen 01/27/23  LIRADS TR nonviable.  No new masses.  MRI abdomen 05/08/23  LIRADS TR nonviable.  No new masses.  Labs:  CBC: Recent Labs    09/26/22 0756 10/10/22 0832 12/25/22 1308 05/04/23 1429  WBC 6.3 6.5 4.3 6.0  HGB 13.6 12.4 12.5 12.5  HCT 41.8 38.5 37.6 38.5  PLT 148* 136* 132* 153    COAGS: Recent Labs    08/14/22 1028 09/26/22 0756 10/10/22 0832 05/04/23 1429  INR 1.3* 1.3* 1.6* 1.3*  APTT  --  33  --   --     BMP: Recent Labs    08/14/22 1028 09/26/22 0756 10/10/22 0832 12/25/22 1308 05/04/23 1429  NA 137 138 138 137 140  K 3.7 4.2 4.9 3.8 4.2  CL 107 104 109 106 103  CO2 23 25 19* 24 25  GLUCOSE 111* 148* 132* 190* 288*  BUN 15 14 13 12 12   CALCIUM 9.2 9.3 9.1 8.8* 8.6  CREATININE 0.98 1.33* 1.02* 0.95 0.99*  GFRNONAA 58* 40* 56* >60  --     LIVER FUNCTION TESTS: Recent Labs    08/14/22 1028 09/26/22 0756 10/10/22 0832 12/25/22 1308 05/04/23 1429  BILITOT 1.5* 1.2 1.6* 1.0 1.5*  AST 53* 62* 44* 39 52*  ALT 36 46* 24 23 25   ALKPHOS 153* 149* 132* 162*  --   PROT 6.2* 6.7 6.2* 6.2* 5.3*  ALBUMIN 3.0* 3.2* 3.0* 3.0*  --    AFP 07/01/22 203 12/25/22 165 05/04/23 1,528   Assessment and Plan: "Tommie" Knezevic is an 82 year old female with NASH cirrhosis (Child Pugh B,  ALBI 2) and biopsy proven unifocal left lobe hepatocellular carcinoma, possibly scirrhous subtype. She is now status post Tc-MAA mapping study with celiac artery stent placement on 09/26/22 followed by radiation segmentectomy to segment II mass on 10/10/22.     She has recovered very well since the procedure without any significant symptoms or complaints.  Initial follow up MRI demonstrates non-viability of the treated mass with significant decrease in size, and recent 6 month follow up demonstrates similar non-viable mass without evidence of new masses.  Unfortunately her recent a-FP has increased significantly, suggestive of possible metastatic disease.   -recommend PET-CT, will discuss with Dr. Nonie Hoyer for ordering -recommend repeat a-FP, possibly at time of PET, to assess for prior erroneous result -follow up in IR clinic in 3 months  Electronically Signed: Thressa Sheller Amory Simonetti 05/15/2023, 10:52 AM   I spent a total of 25 Minutes in virtual telephone clinical consultation, greater than 50% of which was counseling/coordinating care for hepatocellular carcinoma.

## 2023-05-20 ENCOUNTER — Telehealth (INDEPENDENT_AMBULATORY_CARE_PROVIDER_SITE_OTHER): Payer: Self-pay | Admitting: Gastroenterology

## 2023-05-20 DIAGNOSIS — C220 Liver cell carcinoma: Secondary | ICD-10-CM

## 2023-05-20 NOTE — Telephone Encounter (Signed)
PA pending via [employer].   Dolores Frame, MD  Bennie Dallas, MD; [name], MD; Marlowe Shores, LPN I think she will be seeing Sree in early October, I'll order the PET scan in the interim.  [name], can you please schedule a PET CT whole body? Dx: hepatocellular carcinoma.  Thanks,  Katrinka Blazing, MD Gastroenterology and Hepatology [facility]       Previous Messages    ----- Message ----- From: Bennie Dallas, MD Sent: 05/15/2023  11:19 AM EDT To: [name], MD; * Subject: [facility]                                            I visited with Ms. Olazabal today to review her MRI which thankfully is unremarkable, showing persistent non-viability of the treated [facility] in the left lobe.  I don't see any evidence of new disease.  She is overall doing well.  I think with the recent skyrocketing a-FP, a PET is warranted to rule out any extrahepatic disease.  Dr. Ellin Saba, would you be able to order this?  Maybe we could repeat labs when she's scanned to see if prior a-FP may be erroneous?  Thank you,  Dylan

## 2023-05-21 ENCOUNTER — Ambulatory Visit: Payer: Medicare Other | Attending: Cardiology | Admitting: Cardiology

## 2023-05-21 NOTE — Progress Notes (Deleted)
Clinical Summary [name] is a 82 y.o.female  seen today for follow up of the following medical problems.    1. PAF/Prior history of PSVT - history of cryptogenic stroke, loop recorder placed and detected afib - seen in afib clinic.  - PAF, she is on diltiazem and toprol. Eliquis for stroke prevention.    - no recent palpitations. Short pauses noted on loop recorder.  - compliant with meds. She reports GI started ASA (Sep 26 2022 celiac artery stent placed by IR).      2. CAD - history of MI in 1995, received 2 stents to RCA -  cath 2007 with patent coronaries.  - cath Jan 2018 nonobstructive disease - echo 10/2016 LVEF 65-70%, no WMAs   - admit 10/2016 with elevated troponin to 1.33, Thought to be demand ischemia related to her SVT at the time - admit 12/2016 with chest pain. Mild troponin 0.32 at that time. Managed medically due to recent normal cath and stable echo     - no chest pains, no SOB/DOE.    3. Carotid stenosis - 04/2016 moderate bilateral disease  08/2018 mild  12/2019 CTA: <50% bilateral disease -11/2021 CTA: Short-segment 70% stenosis at the left carotid bulb/proximal cervical left ICA, similar to previous. 4. Focal moderate right P2 stenosis, also similar. 5. Right larger than left layering pleural effusions, partially visualized.   4. Cryptogenic cirrhosis  - followed by GI   5. Aortic valve disease - 06/2018 moderate to severe AI, LVEF 65-70%, no LVIDs reported. Does not mention aortic stenosis however mean grad 27 but no AVA VTI given. From 04/2018 study had moderate AS with mean grad 21 and AVA VTI 1.06   12/2019 echo mean grad 19, AVA VTI 1.14 - no recent symptoms   02/2021 echo: LVEF 60-65%, no WMAs, indet diastolic, mod MS(mean grad 5), mod to severe AS(mean grad 26, AVA VTI 0.87, DI 0.34, SVI 32   02/2022 echo: LVEF 65-70%, AVA VTI 0.6, mean grad 24, DI 0.3, SVI 27 - sedentary lifestyle, limited due to chronic weakness.    08/2022 LVEF >75%,  mod MR mild MS mean grad 3, severe low flow AS flow/low gradient with preserved LVEF.  Aortic regurgitation PHT measures 355 msec. Aortic valve area, by VTI  measures 0.91 cm. Aortic valve mean gradient measures 24.0 mmHg.  Dimentionless index 0.29.        6. Mitral stenosis -12/2019 echo mean grad 5 - 02/2021 echo: LVEF 60-65%, no WMAs, indet diastolic, mod MS(mean grad 5), mod to severe AS(mean grad 26, AVA VTI 0.87, DI 0.34, SVI 32   02/2022 echo MV mean grad 5   7. History of CVA - admittion 07/2018, 12/2019  - has loop recorder for history of cryptogenic stroke.    8. Hyperlipidemia 06/2018 TC 109 TG 50 HDL 49 LDL 50 12/2019 TC 162 HDL 63 TG 78 LDL 83 -has been on atorvastatin 80mg  long term   - labs followed by pcp   11/2021 TC 102 TG 52 HDL 34 LDL 58    9. HTN -has not taken meds yet     10. Leg weakness - typically in wheel chair, but can use walker for short distances   11. Hepatocellular carcinoma - followed by GI and oncology     Past Medical History:  Diagnosis Date   Anemia    Anxiety    Aortic insufficiency    Moderate   Atrial fibrillation (HCC)  Back pain    Carotid stenosis, bilateral    Cirrhosis (HCC)    Closed left femoral fracture (HCC) 04/21/2017   Coronary artery disease    Stent x 2 RCA 1995, cardiac catheterization 09/2016 showing only mild atherosclerosis   DDD (degenerative disc disease), lumbar    Depression    Diabetes mellitus without complication (HCC)    Essential hypertension    Falls    GERD (gastroesophageal reflux disease)    Hiatal hernia    History of stroke    Hypercholesteremia    IBS (irritable bowel syndrome)    Non-alcoholic fatty liver disease    OSA (obstructive sleep apnea)    no CPAP   Pericardial effusion    a. small by echo 2018.   PSVT (paroxysmal supraventricular tachycardia)    Recurrent UTI    Seizures (HCC) 2018   Stroke (HCC) 07/2019   several strokes within the last 10 years   Swallowing  difficulty    Varices, esophageal (HCC)      Allergies  Allergen Reactions   Tape Rash and Other (See Comments)    Old paper tape caused a reaction, doesn't react to most other tapes      Current Outpatient Medications  Medication Sig Dispense Refill   acetaminophen (TYLENOL) 500 MG tablet Take 1,000 mg by mouth at bedtime.     apixaban (ELIQUIS) 5 MG TABS tablet TAKE (1) TABLET BY MOUTH TWICE DAILY. 60 tablet 5   aspirin EC 81 MG tablet Take 1 tablet (81 mg total) by mouth daily. Swallow whole. 30 tablet 12   atorvastatin (LIPITOR) 80 MG tablet Take 1 tablet (80 mg total) by mouth daily. (Patient taking differently: Take 80 mg by mouth at bedtime.) 30 tablet 1   azelastine (ASTELIN) 0.1 % nasal spray Place 1 spray into both nostrils at bedtime.     CALCIUM CITRATE PO Take 200 mg by mouth daily.     carvedilol (COREG) 6.25 MG tablet TAKE (1) TABLET BY MOUTH TWICE DAILY WITH A MEAL. 180 tablet 3   Cholecalciferol (VITAMIN D3) 50 MCG (2000 UT) TABS Take 2,000 Units by mouth daily.     Coenzyme Q10 (CO Q-10) 100 MG CAPS Take 100 mg by mouth in the morning.     diltiazem (CARDIZEM CD) 180 MG 24 hr capsule TAKE (1) CAPSULE BY MOUTH ONCE DAILY. 90 capsule 1   doxepin (SINEQUAN) 25 MG capsule Take 25 mg by mouth at bedtime.     furosemide (LASIX) 40 MG tablet Take 0.5 tablets (20 mg total) by mouth daily. TAKE (1) TABLET BY MOUTH ONCE DAILY. MAY TAKE ADDITIONAL HALF IF NEEDED FOR SEVERE SWELLING. Strength: 20 mg (Patient taking differently: Take 20-40 mg by mouth See admin instructions. Alternate taking 20 mg one day and 40 mg the next) 45 tablet 0   lactulose (CHRONULAC) 10 GM/15ML solution Take 30 g by mouth daily at 6 (six) AM.     Multiple Vitamin (MULTIVITAMIN WITH MINERALS) TABS tablet Take 1 tablet by mouth daily.     nitroGLYCERIN (NITROSTAT) 0.4 MG SL tablet Place 0.4 mg under the tongue every 5 (five) minutes as needed for chest pain.     omeprazole (PRILOSEC) 40 MG capsule TAKE (1)  CAPSULE BY MOUTH ONCE DAILY. 90 capsule 0   ondansetron (ZOFRAN-ODT) 4 MG disintegrating tablet Take 4 mg by mouth every 8 (eight) hours as needed for nausea or vomiting.     potassium chloride SA (KLOR-CON M) 20 MEQ tablet Take  20 mEq by mouth 2 (two) times daily.     No current facility-administered medications for this visit.   Facility-Administered Medications Ordered in Other Visits  Medication Dose Route Frequency Provider Last Rate Last Admin   dexamethasone (DECADRON) injection 8 mg  8 mg Intravenous Once Boisseau, Hayley, PA         Past Surgical History:  Procedure Laterality Date   ANAL FISSURE REPAIR  1972   APPENDECTOMY     BACK SURGERY  1985   CARDIAC CATHETERIZATION  [phone]   Stent to the proximal RCA after MI    CARDIAC CATHETERIZATION N/A 09/26/2016   Procedure: Left Heart Cath and Coronary Angiography;  Surgeon: Marykay Lex, MD;  Location: [facility] INVASIVE CV LAB;  Service: Cardiovascular;  Laterality: N/A;   CATARACT EXTRACTION W/PHACO Right 07/04/2014   Procedure: CATARACT EXTRACTION PHACO AND INTRAOCULAR LENS PLACEMENT (IOC);  Surgeon: Loraine Leriche T. Nile Riggs, MD;  Location: [facility];  Service: Ophthalmology;  Laterality: Right;  CDE:13.13   COLONOSCOPY     DILATION AND CURETTAGE OF UTERUS     x2   Epi Retinal Membrane Peel Left    ERCP     ESOPHAGEAL BANDING N/A 04/01/2013   Procedure: ESOPHAGEAL BANDING;  Surgeon: Malissa Hippo, MD;  Location: [facility];  Service: Endoscopy;  Laterality: N/A;   ESOPHAGEAL BANDING N/A 05/24/2013   Procedure: ESOPHAGEAL BANDING;  Surgeon: Malissa Hippo, MD;  Location: [facility];  Service: Endoscopy;  Laterality: N/A;   ESOPHAGEAL BANDING N/A 06/21/2014   Procedure: ESOPHAGEAL BANDING;  Surgeon: Malissa Hippo, MD;  Location: [facility];  Service: Endoscopy;  Laterality: N/A;   ESOPHAGEAL BANDING  06/15/2020   Procedure: ESOPHAGEAL BANDING;  Surgeon: Dolores Frame, MD;  Location: [facility];  Service:  Gastroenterology;;   ESOPHAGOGASTRODUODENOSCOPY N/A 04/01/2013   Procedure: ESOPHAGOGASTRODUODENOSCOPY (EGD);  Surgeon: Malissa Hippo, MD;  Location: [facility];  Service: Endoscopy;  Laterality: N/A;  230-rescheduled to 8:30am Ann notified pt   ESOPHAGOGASTRODUODENOSCOPY N/A 05/24/2013   Procedure: ESOPHAGOGASTRODUODENOSCOPY (EGD);  Surgeon: Malissa Hippo, MD;  [facility]: [facility];  Service: Endoscopy;  Laterality: N/A;  730   ESOPHAGOGASTRODUODENOSCOPY N/A 06/21/2014   Procedure: ESOPHAGOGASTRODUODENOSCOPY (EGD);  Surgeon: Malissa Hippo, MD;  [facility];  Service: Endoscopy;  Laterality: N/A;  930-rescheduled 10/14 @ 1200 Ann to notify pt   ESOPHAGOGASTRODUODENOSCOPY (EGD) WITH PROPOFOL N/A 06/15/2020   Procedure: ESOPHAGOGASTRODUODENOSCOPY (EGD) WITH PROPOFOL;  Surgeon: Dolores Frame, MD;  [facility];  Service: Gastroenterology;  Laterality: N/A;  230   ESOPHAGOGASTRODUODENOSCOPY (EGD) WITH PROPOFOL N/A 07/27/2020   Procedure: ESOPHAGOGASTRODUODENOSCOPY (EGD) WITH PROPOFOL;  Surgeon: Dolores Frame, MD;  [facility];  Service: Gastroenterology;  Laterality: N/A;  1245   ESOPHAGOGASTRODUODENOSCOPY (EGD) WITH PROPOFOL N/A 08/19/2022   Procedure: ESOPHAGOGASTRODUODENOSCOPY (EGD) WITH PROPOFOL;  Surgeon: Dolores Frame, MD;  [facility]: [facility];  Service: Gastroenterology;  Laterality: N/A;  130 ASA 3, pt knows to arrive at 6:15   EYE SURGERY  08   cataract surgery of the left eye   FEMUR IM NAIL Left 03/02/2017   Procedure: INTRAMEDULLARY (IM) NAIL FEMORAL;  Surgeon: Samson Frederic, MD;  Location: [facility];  Service: Orthopedics;  Laterality: Left;   HARDWARE REMOVAL Right 01/17/2013   Procedure: REMOVAL OF HARDWARE AND EXCISION ULNAR STYLOID RIGHT WRIST;  Surgeon: Tami Ribas, MD;  Location: [facility];  Service: Orthopedics;  Laterality: Right;  implanted heart monitor  02/2020   to monitor Afib    IR 3D INDEPENDENT WKST  09/26/2022   IR ANGIOGRAM SELECTIVE EACH ADDITIONAL VESSEL  09/26/2022   IR ANGIOGRAM SELECTIVE EACH ADDITIONAL VESSEL  09/26/2022   IR ANGIOGRAM SELECTIVE EACH ADDITIONAL VESSEL  09/26/2022   IR ANGIOGRAM SELECTIVE EACH ADDITIONAL VESSEL  09/26/2022   IR ANGIOGRAM SELECTIVE EACH ADDITIONAL VESSEL  09/26/2022   IR ANGIOGRAM SELECTIVE EACH ADDITIONAL VESSEL  10/10/2022   IR ANGIOGRAM SELECTIVE EACH ADDITIONAL VESSEL  10/10/2022   IR ANGIOGRAM SELECTIVE EACH ADDITIONAL VESSEL  10/10/2022   IR ANGIOGRAM VISCERAL SELECTIVE  09/26/2022   IR ANGIOGRAM VISCERAL SELECTIVE  10/10/2022   IR EMBO TUMOR ORGAN ISCHEMIA INFARCT INC GUIDE ROADMAPPING  10/10/2022   IR INTRAVASCULAR ULTRASOUND NON CORONARY  09/26/2022   IR RADIOLOGIST EVAL & MGMT  09/10/2022   IR RADIOLOGIST EVAL & MGMT  11/03/2022   IR RADIOLOGIST EVAL & MGMT  02/10/2023   IR RADIOLOGIST EVAL & MGMT  05/15/2023   IR TRANSCATH PLC STENT 1ST ART NOT LE CV CAR VERT CAR  09/26/2022   IR US GUIDE VASC ACCESS RIGHT  09/26/2022   IR US GUIDE VASC ACCESS RIGHT  10/10/2022   MYRINGOTOMY  2012   both ears   ORIF FEMUR FRACTURE Left 04/23/2017   Procedure: OPEN REDUCTION INTERNAL FIXATION (ORIF) DISTAL FEMUR FRACTURE (FRACTURE AROUND FEMORAL NAIL);  Surgeon: Myrene Galas, MD;  Location[facility];  Service: Orthopedics;  Laterality: Left;   TONSILLECTOMY     TUBAL LIGATION     VAGINAL HYSTERECTOMY  1972   WRIST SURGERY     rt wrist hardwear removal     Allergies  Allergen Reactions   Tape Rash and Other (See Comments)    Old paper tape caused a reaction, doesn't react to most other tapes       Family History  Problem Relation Age of Onset   Diabetes Mother    Heart failure Father    Heart failure Maternal Aunt      Social History Ms. Salah reports that she quit smoking about 10 months ago. Her smoking use included cigarettes. She started smoking about 50 years ago. She has a 12.5 pack-year smoking history. She has been exposed to  tobacco smoke. She has never used smokeless tobacco. Ms. Lacharite reports no history of alcohol use.   Review of Systems CONSTITUTIONAL: No weight loss, fever, chills, weakness or fatigue.  HEENT: Eyes: No visual loss, blurred vision, double vision or yellow sclerae.No hearing loss, sneezing, congestion, runny nose or sore throat.  SKIN: No rash or itching.  CARDIOVASCULAR:  RESPIRATORY: No shortness of breath, cough or sputum.  GASTROINTESTINAL: No anorexia, nausea, vomiting or diarrhea. No abdominal pain or blood.  GENITOURINARY: No burning on urination, no polyuria NEUROLOGICAL: No headache, dizziness, syncope, paralysis, ataxia, numbness or tingling in the extremities. No change in bowel or bladder control.  MUSCULOSKELETAL: No muscle, back pain, joint pain or stiffness.  LYMPHATICS: No enlarged nodes. No history of splenectomy.  PSYCHIATRIC: No history of depression or anxiety.  ENDOCRINOLOGIC: No reports of sweating, cold or heat intolerance. No polyuria or polydipsia.  Marland Kitchen   Physical Examination There were no vitals filed for this visit. There were no vitals filed for this visit.  Gen: resting comfortably, no acute distress HEENT: no scleral icterus, pupils equal round and reactive, no palptable cervical adenopathy,  CV Resp: Clear to auscultation bilaterally GI: abdomen is soft, non-tender, non-distended, normal bowel sounds, no hepatosplenomegaly  MSK: extremities are warm, no edema.  Skin: warm, no rash Neuro:  no focal deficits Psych: appropriate affect   Diagnostic Studies  06/2018 echo Study Conclusions   - Left ventricle: The cavity size was normal. Wall thickness was   increased in a pattern of moderate LVH. Systolic function was   vigorous. The estimated ejection fraction was in the range of 65%   to 70%. Features are consistent with a pseudonormal left   ventricular filling pattern, with concomitant abnormal relaxation   and increased filling pressure (grade 2  diastolic dysfunction). - Aortic valve: Mildly calcified annulus. There was moderate to   severe regurgitation. - Mitral valve: There was mild to moderate regurgitation. - Left atrium: The atrium was severely dilated.   Impressions:   - Technically difficult echo   No parasternal views.     LV systolic function   The aortic insufficiency is at least moderate - severe in   severity .     08/2018 carotid US Summary: Right Carotid: Velocities in the right ICA are consistent with a 1-39% stenosis.   Left Carotid: Velocities in the left ICA are consistent with a 1-39% stenosis,               calcific plaque may obscure higher velicity.   12/2019 echo 1. Left ventricular ejection fraction, by estimation, is 60 to 65%. The  left ventricle has normal function. The left ventricle has no regional  wall motion abnormalities. There is mild left ventricular hypertrophy.  Left ventricular diastolic parameters  are consistent with Grade II diastolic dysfunction (pseudonormalization).  Elevated left atrial pressure.   2. Right ventricular systolic function is normal. The right ventricular  size is normal. There is mildly elevated pulmonary artery systolic  pressure.   3. Left atrial size was severely dilated.   4. The mitral valve was not well visualized. Mild mitral valve  regurgitation. Moderate mitral stenosis.MV peak gradient, 13.4 mmHg. The  mean mitral valve gradient is 5.0 mmHg.   5. The aortic valve was not well visualized. Aortic valve regurgitation  is moderate. Moderate aortic valve stenosis.   6. The inferior vena cava is normal in size with greater than 50%  respiratory variability, suggesting right atrial pressure of 3 mmHg.    02/2021 echo IMPRESSIONS     1. Left ventricular ejection fraction, by estimation, is 60 to 65%. The  left ventricle has normal function. The left ventricle has no regional  wall motion abnormalities. There is mild left ventricular hypertrophy.   Left ventricular diastolic parameters  are indeterminate.   2. Right ventricular systolic function is normal. The right ventricular  size is normal.   3. Left atrial size was severely dilated.   4. Right atrial size was moderately dilated.   5. Previous MV gradients on TTE 01/05/20 mean 5 peak 13.4 mmHg at a HR of  68 bpm Gradients similar today at higher HR of 100 bpm MS primarily from  MAC especially posterior annulus and subchordal area . The mitral valve is  degenerative. Trivial mitral  valve regurgitation. Moderate mitral stenosis. Severe mitral annular  calcification.   6. Gradients have increased since echo 01/05/20 Degree of AR similar . The  aortic valve is tricuspid. There is moderate calcification of the aortic  valve. Aortic valve regurgitation is moderate. Moderate to severe aortic  valve stenosis.   7. The inferior vena cava is dilated in size with <50% respiratory  variability, suggesting right atrial pressure of 15 mmHg.  Assessment and Plan   1. Afib/prior PSVT/acquired thrombophlia - no symptoms, continue current meds. Just meets weight and age criteria to continue eliquis at 5mg  bid dosing.     2.  CAD -no recent symptoms, continue current meds   3.Aortic stenosis/Mitral stenosis -paradoxical low flow low gradient mod to severe AS.  - no symptoms though sedentary due to chronic generalized weakness - consider repeat echo over time pending clinical course of her liver disease and heptaocellular carcinoma, I doubt ultimately would ever be a candidate for valve replacement.    4. Carotid stenosis - repeat US later this year pending overall clinical course.    5. Hyperlipidemia - at goal, continue current meds   6. HTN - elevated here but has not taken meds, has been at goal       Antoine Poche, M.D., F.A.C.C.

## 2023-05-22 ENCOUNTER — Encounter: Payer: Self-pay | Admitting: Dermatology

## 2023-05-22 NOTE — Telephone Encounter (Signed)
PET CT of the whole body ([place]) ordered and approved via [employer]. Contacted Nuclear Med at [facility], they advised me to call Central Scheduling, contacted Central Scheduling and they stated Nuclear med scheduled those directly. Contacted [employer] and representative states that this pt is needing a HIDA scan or a PET Dotate. Please advise as to which one is needed. (Rep states the [place] is just for breast cancer)

## 2023-05-22 NOTE — Telephone Encounter (Signed)
It would be a PET CT scan. Does not need to be Dotate, but no HIDA for sure Thanks

## 2023-05-25 NOTE — Telephone Encounter (Signed)
Pet CT whole body re ordered as NM PET Image Initial Whole Body. Left message at [facility] for scheduler to return call.Approval is good for CPT code 16109 Pet/CT scan of the whole body to evaluate tumor (05/21/23-07/05/23)

## 2023-05-25 NOTE — Telephone Encounter (Signed)
PA pending via [facility]

## 2023-05-25 NOTE — Addendum Note (Signed)
Addended by: Marlowe Shores on: 05/25/2023 07:59 AM   Modules accepted: Orders

## 2023-05-25 NOTE — Telephone Encounter (Signed)
should this be a skull base to thigh ?   8:12 AM HJ [name], RT yes, this should be a skull base to thigh   1  8:41 AM TC [name] thank you  8:44 AM You were added by [name]. 8:44 AM Domingo Cocking, can you change this Pet request to NM Pet skull base to thigh initial staging   TC please   8:44 AM Dolores Frame, MD was added by you. 8:45 AM Dr.C-could you please advise on what I need to order? Thank you  8:46 AM DC Dolores Frame, MD Hi [name], yes that's the order NM Pet skull base to thigh  8:47 AM TC [name] I can update the request and you can just E- sign if that would be easier ?   59 mins DC Dolores Frame, MD yes please  1  59 mins TC [name] done   59 mins DC Dolores Frame, MD thx  59 mins TC [name] thank you  58 mins thank you  TM I will need to get updated PA for the skull to base   Pt has been scheduled for 05/28/23. ( I am working on auth at this time)

## 2023-05-28 ENCOUNTER — Ambulatory Visit (HOSPITAL_COMMUNITY)
Admission: RE | Admit: 2023-05-28 | Discharge: 2023-05-28 | Disposition: A | Discharge status: Home / Self Care | Payer: Medicare Other | Source: Ambulatory Visit | Attending: Gastroenterology | Admitting: Gastroenterology

## 2023-05-28 DIAGNOSIS — C220 Liver cell carcinoma: Secondary | ICD-10-CM | POA: Diagnosis not present

## 2023-05-28 MED ORDER — FLUDEOXYGLUCOSE F - 18 (FDG) INJECTION
6.4000 | Freq: Once | INTRAVENOUS | Status: AC | PRN
  Administered 2023-05-28: 6.4 via INTRAVENOUS

## 2023-05-28 NOTE — Telephone Encounter (Signed)
Per fax from [employer] call [phone], select option #1 and enter in Reference number [id number] to speak with a clinician about this request. If you would rather engage in a Peer to Peer discussion, this discussion must take place before 06/04/23. Fax is on provider desk for review. Please advise. Thank you.

## 2023-06-01 ENCOUNTER — Ambulatory Visit (INDEPENDENT_AMBULATORY_CARE_PROVIDER_SITE_OTHER): Payer: Medicare Other

## 2023-06-01 DIAGNOSIS — I639 Cerebral infarction, unspecified: Secondary | ICD-10-CM | POA: Diagnosis not present

## 2023-06-01 NOTE — Telephone Encounter (Signed)
As discussed, please send the appeal letter with the rest of requested documents by [place] [employer]. Thanks

## 2023-06-02 ENCOUNTER — Inpatient Hospital Stay: Payer: Medicare Other | Attending: Hematology

## 2023-06-02 ENCOUNTER — Ambulatory Visit (HOSPITAL_COMMUNITY)
Admission: RE | Admit: 2023-06-02 | Discharge: 2023-06-02 | Disposition: A | Discharge status: Home / Self Care | Payer: Medicare Other | Source: Ambulatory Visit | Attending: Hematology | Admitting: Hematology

## 2023-06-02 ENCOUNTER — Encounter (HOSPITAL_COMMUNITY): Payer: Self-pay

## 2023-06-02 DIAGNOSIS — C220 Liver cell carcinoma: Secondary | ICD-10-CM | POA: Diagnosis not present

## 2023-06-02 LAB — COMPREHENSIVE METABOLIC PANEL
ALT: 23 U/L (ref 0–44)
AST: 38 U/L (ref 15–41)
Albumin: 2.8 g/dL — ABNORMAL LOW (ref 3.5–5.0)
Alkaline Phosphatase: 138 U/L — ABNORMAL HIGH (ref 38–126)
Anion gap: 9 (ref 5–15)
BUN: 15 mg/dL (ref 8–23)
CO2: 27 mmol/L (ref 22–32)
Calcium: 9 mg/dL (ref 8.9–10.3)
Chloride: 104 mmol/L (ref 98–111)
Creatinine, Ser: 1.12 mg/dL — ABNORMAL HIGH (ref 0.44–1.00)
GFR, Estimated: 50 mL/min — ABNORMAL LOW (ref >60)
Glucose, Bld: 134 mg/dL — ABNORMAL HIGH (ref 70–99)
Potassium: 4.5 mmol/L (ref 3.5–5.1)
Sodium: 140 mmol/L (ref 135–145)
Total Bilirubin: 1.8 mg/dL — ABNORMAL HIGH (ref 0.3–1.2)
Total Protein: 5.8 g/dL — ABNORMAL LOW (ref 6.5–8.1)

## 2023-06-02 LAB — CBC WITH DIFFERENTIAL/PLATELET
Abs Immature Granulocytes: 0.01 10*3/uL (ref 0.00–0.07)
Basophils Absolute: 0 10*3/uL (ref 0.0–0.1)
Basophils Relative: 0 %
Eosinophils Absolute: 0.3 10*3/uL (ref 0.0–0.5)
Eosinophils Relative: 6 %
HCT: 37.7 % (ref 36.0–46.0)
Hemoglobin: 12.1 g/dL (ref 12.0–15.0)
Immature Granulocytes: 0 %
Lymphocytes Relative: 25 %
Lymphs Abs: 1.1 10*3/uL (ref 0.7–4.0)
MCH: 31.8 pg (ref 26.0–34.0)
MCHC: 32.1 g/dL (ref 30.0–36.0)
MCV: 99 fL (ref 80.0–100.0)
Monocytes Absolute: 0.4 10*3/uL (ref 0.1–1.0)
Monocytes Relative: 10 %
Neutro Abs: 2.6 10*3/uL (ref 1.7–7.7)
Neutrophils Relative %: 59 %
Platelets: 145 10*3/uL — ABNORMAL LOW (ref 150–400)
RBC: 3.81 MIL/uL — ABNORMAL LOW (ref 3.87–5.11)
RDW: 15.8 % — ABNORMAL HIGH (ref 11.5–15.5)
WBC: 4.5 10*3/uL (ref 4.0–10.5)
nRBC: 0 % (ref 0.0–0.2)

## 2023-06-02 LAB — CUP PACEART REMOTE DEVICE CHECK
Date Time Interrogation Session: 20240920230534
Implantable Pulse Generator Implant Date: 20210602

## 2023-06-02 NOTE — Telephone Encounter (Signed)
Will fax all needed information to fax number [phone]

## 2023-06-03 LAB — AFP TUMOR MARKER: AFP, Serum, Tumor Marker: 2492 ng/mL — ABNORMAL HIGH (ref 0.0–9.2)

## 2023-06-05 NOTE — Telephone Encounter (Signed)
Fax from [employer] stating they denied PET scan. Fax on provider desk.

## 2023-06-05 NOTE — Telephone Encounter (Signed)
It is terrible that the insurance is not approving this, especially with her history and abnormal blood testing. [name], can we repeat an AFP test? Please let her know the insurance has denied twice her scan for unclear reasons.  FYI Dylan and Sree. I think he has an upcoming appointment on 10/1 with oncology.

## 2023-06-08 ENCOUNTER — Ambulatory Visit: Payer: Medicare Other | Admitting: Hematology

## 2023-06-08 ENCOUNTER — Other Ambulatory Visit (INDEPENDENT_AMBULATORY_CARE_PROVIDER_SITE_OTHER): Payer: Self-pay

## 2023-06-08 DIAGNOSIS — K7581 Nonalcoholic steatohepatitis (NASH): Secondary | ICD-10-CM

## 2023-06-08 DIAGNOSIS — K2100 Gastro-esophageal reflux disease with esophagitis, without bleeding: Secondary | ICD-10-CM

## 2023-06-08 DIAGNOSIS — I8510 Secondary esophageal varices without bleeding: Secondary | ICD-10-CM

## 2023-06-08 DIAGNOSIS — K7460 Unspecified cirrhosis of liver: Secondary | ICD-10-CM

## 2023-06-08 DIAGNOSIS — I10 Essential (primary) hypertension: Secondary | ICD-10-CM

## 2023-06-08 DIAGNOSIS — C220 Liver cell carcinoma: Secondary | ICD-10-CM

## 2023-06-08 NOTE — Telephone Encounter (Signed)
It was approved for full body.  They may just have to do an adjustment once she gets her bill.  Surely they can work it out with her. It may just need a code adjustment at some point, but may not need it.

## 2023-06-08 NOTE — Telephone Encounter (Signed)
She had her PET skull base to thigh done on the 19 th.  Although it has not been read but was completed.

## 2023-06-08 NOTE — Telephone Encounter (Signed)
Thanks, I saw her before she had a PET scan performed but I wonder if it will be covered by the insurance.

## 2023-06-08 NOTE — Telephone Encounter (Signed)
Thanks Tomi!

## 2023-06-08 NOTE — Telephone Encounter (Signed)
Noted and sent to [facility].

## 2023-06-08 NOTE — Telephone Encounter (Signed)
I spoke with Granddaughter Simonne Maffucci, She is aware the patient is in need of blood work. Per Simonne Maffucci she was told the pet scan was approved by [facility], and they had given her approval number,but she has lost the paper she wrote on. She says she will call them again today. Orders placed for [facility] for AFP.

## 2023-06-08 NOTE — Telephone Encounter (Signed)
I have called for a STAT read on is since we are seeing her tomorrow.

## 2023-06-09 ENCOUNTER — Inpatient Hospital Stay: Payer: Medicare Other | Attending: Hematology | Admitting: Hematology

## 2023-06-09 ENCOUNTER — Ambulatory Visit: Payer: Medicare Other | Admitting: Cardiology

## 2023-06-09 VITALS — BP 133/56 | HR 68 | Temp 97.8°F | Resp 18

## 2023-06-09 DIAGNOSIS — I700 Atherosclerosis of aorta: Secondary | ICD-10-CM | POA: Insufficient documentation

## 2023-06-09 DIAGNOSIS — K219 Gastro-esophageal reflux disease without esophagitis: Secondary | ICD-10-CM | POA: Diagnosis not present

## 2023-06-09 DIAGNOSIS — Z90710 Acquired absence of both cervix and uterus: Secondary | ICD-10-CM | POA: Insufficient documentation

## 2023-06-09 DIAGNOSIS — Z8673 Personal history of transient ischemic attack (TIA), and cerebral infarction without residual deficits: Secondary | ICD-10-CM | POA: Diagnosis not present

## 2023-06-09 DIAGNOSIS — Z87440 Personal history of urinary (tract) infections: Secondary | ICD-10-CM | POA: Insufficient documentation

## 2023-06-09 DIAGNOSIS — J90 Pleural effusion, not elsewhere classified: Secondary | ICD-10-CM | POA: Diagnosis not present

## 2023-06-09 DIAGNOSIS — Z79899 Other long term (current) drug therapy: Secondary | ICD-10-CM | POA: Insufficient documentation

## 2023-06-09 DIAGNOSIS — Z7901 Long term (current) use of anticoagulants: Secondary | ICD-10-CM | POA: Diagnosis not present

## 2023-06-09 DIAGNOSIS — Z87891 Personal history of nicotine dependence: Secondary | ICD-10-CM | POA: Diagnosis not present

## 2023-06-09 DIAGNOSIS — Z833 Family history of diabetes mellitus: Secondary | ICD-10-CM | POA: Insufficient documentation

## 2023-06-09 DIAGNOSIS — K7460 Unspecified cirrhosis of liver: Secondary | ICD-10-CM | POA: Diagnosis not present

## 2023-06-09 DIAGNOSIS — I2510 Atherosclerotic heart disease of native coronary artery without angina pectoris: Secondary | ICD-10-CM | POA: Diagnosis not present

## 2023-06-09 DIAGNOSIS — C220 Liver cell carcinoma: Secondary | ICD-10-CM | POA: Diagnosis not present

## 2023-06-09 DIAGNOSIS — I4819 Other persistent atrial fibrillation: Secondary | ICD-10-CM | POA: Diagnosis not present

## 2023-06-09 DIAGNOSIS — Z9049 Acquired absence of other specified parts of digestive tract: Secondary | ICD-10-CM | POA: Insufficient documentation

## 2023-06-09 DIAGNOSIS — I10 Essential (primary) hypertension: Secondary | ICD-10-CM | POA: Insufficient documentation

## 2023-06-09 DIAGNOSIS — M79605 Pain in left leg: Secondary | ICD-10-CM | POA: Insufficient documentation

## 2023-06-09 DIAGNOSIS — Z8249 Family history of ischemic heart disease and other diseases of the circulatory system: Secondary | ICD-10-CM | POA: Diagnosis not present

## 2023-06-09 DIAGNOSIS — R634 Abnormal weight loss: Secondary | ICD-10-CM | POA: Diagnosis not present

## 2023-06-09 DIAGNOSIS — K7581 Nonalcoholic steatohepatitis (NASH): Secondary | ICD-10-CM | POA: Insufficient documentation

## 2023-06-09 NOTE — Progress Notes (Deleted)
Clinical Summary [name] is a 82 y.o.female  seen today for follow up of the following medical problems.    1. PAF/Prior history of PSVT - history of cryptogenic stroke, loop recorder placed and detected afib - seen in afib clinic.  - PAF, she is on diltiazem and toprol. Eliquis for stroke prevention.    - no recent palpitations. Short pauses noted on loop recorder.  - compliant with meds. She reports GI started ASA (Sep 26 2022 celiac artery stent placed by IR).      2. CAD - history of MI in 1995, received 2 stents to RCA -  cath 2007 with patent coronaries.  - cath Jan 2018 nonobstructive disease - echo 10/2016 LVEF 65-70%, no WMAs   - admit 10/2016 with elevated troponin to 1.33, Thought to be demand ischemia related to her SVT at the time - admit 12/2016 with chest pain. Mild troponin 0.32 at that time. Managed medically due to recent normal cath and stable echo     - no chest pains, no SOB/DOE.    3. Carotid stenosis - 04/2016 moderate bilateral disease  08/2018 mild  12/2019 CTA: <50% bilateral disease -11/2021 CTA: Short-segment 70% stenosis at the left carotid bulb/proximal cervical left ICA, similar to previous. 4. Focal moderate right P2 stenosis, also similar. 5. Right larger than left layering pleural effusions, partially visualized.   4. Cryptogenic cirrhosis  - followed by GI   5. Aortic valve disease - 06/2018 moderate to severe AI, LVEF 65-70%, no LVIDs reported. Does not mention aortic stenosis however mean grad 27 but no AVA VTI given. From 04/2018 study had moderate AS with mean grad 21 and AVA VTI 1.06   12/2019 echo mean grad 19, AVA VTI 1.14 - no recent symptoms   02/2021 echo: LVEF 60-65%, no WMAs, indet diastolic, mod MS(mean grad 5), mod to severe AS(mean grad 26, AVA VTI 0.87, DI 0.34, SVI 32   02/2022 echo: LVEF 65-70%, AVA VTI 0.6, mean grad 24, DI 0.3, SVI 27 - sedentary lifestyle, limited due to chronic weakness.    08/2022 LVEF >75%,  mod MR mild MS mean grad 3, severe low flow AS flow/low gradient with preserved LVEF.  Aortic regurgitation PHT measures 355 msec. Aortic valve area, by VTI  measures 0.91 cm. Aortic valve mean gradient measures 24.0 mmHg.  Dimentionless index 0.29.        6. Mitral stenosis -12/2019 echo mean grad 5 - 02/2021 echo: LVEF 60-65%, no WMAs, indet diastolic, mod MS(mean grad 5), mod to severe AS(mean grad 26, AVA VTI 0.87, DI 0.34, SVI 32   02/2022 echo MV mean grad 5   7. History of CVA - admittion 07/2018, 12/2019  - has loop recorder for history of cryptogenic stroke.    8. Hyperlipidemia 06/2018 TC 109 TG 50 HDL 49 LDL 50 12/2019 TC 162 HDL 63 TG 78 LDL 83 -has been on atorvastatin 80mg  long term   - labs followed by pcp   11/2021 TC 102 TG 52 HDL 34 LDL 58    9. HTN -has not taken meds yet     10. Leg weakness - typically in wheel chair, but can use walker for short distances   11. Hepatocellular carcinoma - followed by GI and oncology Past Medical History:  Diagnosis Date   Anemia    Anxiety    Aortic insufficiency    Moderate   Atrial fibrillation (HCC)    Back pain  Carotid stenosis, bilateral    Cirrhosis (HCC)    Closed left femoral fracture (HCC) 04/21/2017   Coronary artery disease    Stent x 2 RCA 1995, cardiac catheterization 09/2016 showing only mild atherosclerosis   DDD (degenerative disc disease), lumbar    Depression    Diabetes mellitus without complication (HCC)    Essential hypertension    Falls    GERD (gastroesophageal reflux disease)    Hiatal hernia    History of stroke    Hypercholesteremia    IBS (irritable bowel syndrome)    Non-alcoholic fatty liver disease    OSA (obstructive sleep apnea)    no CPAP   Pericardial effusion    a. small by echo 2018.   PSVT (paroxysmal supraventricular tachycardia)    Recurrent UTI    Seizures (HCC) 2018   Stroke (HCC) 07/2019   several strokes within the last 10 years   Swallowing difficulty     Varices, esophageal (HCC)      Allergies  Allergen Reactions   Tape Rash and Other (See Comments)    Old paper tape caused a reaction, doesn't react to most other tapes      Current Outpatient Medications  Medication Sig Dispense Refill   acetaminophen (TYLENOL) 500 MG tablet Take 1,000 mg by mouth at bedtime.     apixaban (ELIQUIS) 5 MG TABS tablet TAKE (1) TABLET BY MOUTH TWICE DAILY. 60 tablet 5   aspirin EC 81 MG tablet Take 1 tablet (81 mg total) by mouth daily. Swallow whole. 30 tablet 12   atorvastatin (LIPITOR) 80 MG tablet Take 1 tablet (80 mg total) by mouth daily. (Patient taking differently: Take 80 mg by mouth at bedtime.) 30 tablet 1   azelastine (ASTELIN) 0.1 % nasal spray Place 1 spray into both nostrils at bedtime.     CALCIUM CITRATE PO Take 200 mg by mouth daily.     carvedilol (COREG) 6.25 MG tablet TAKE (1) TABLET BY MOUTH TWICE DAILY WITH A MEAL. 180 tablet 3   Cholecalciferol (VITAMIN D3) 50 MCG (2000 UT) TABS Take 2,000 Units by mouth daily.     Coenzyme Q10 (CO Q-10) 100 MG CAPS Take 100 mg by mouth in the morning.     diltiazem (CARDIZEM CD) 180 MG 24 hr capsule TAKE (1) CAPSULE BY MOUTH ONCE DAILY. 90 capsule 1   doxepin (SINEQUAN) 25 MG capsule Take 25 mg by mouth at bedtime.     furosemide (LASIX) 40 MG tablet Take 0.5 tablets (20 mg total) by mouth daily. TAKE (1) TABLET BY MOUTH ONCE DAILY. MAY TAKE ADDITIONAL HALF IF NEEDED FOR SEVERE SWELLING. Strength: 20 mg (Patient taking differently: Take 20-40 mg by mouth See admin instructions. Alternate taking 20 mg one day and 40 mg the next) 45 tablet 0   lactulose (CHRONULAC) 10 GM/15ML solution Take 30 g by mouth daily at 6 (six) AM.     Multiple Vitamin (MULTIVITAMIN WITH MINERALS) TABS tablet Take 1 tablet by mouth daily.     nitroGLYCERIN (NITROSTAT) 0.4 MG SL tablet Place 0.4 mg under the tongue every 5 (five) minutes as needed for chest pain.     omeprazole (PRILOSEC) 40 MG capsule TAKE (1) CAPSULE BY  MOUTH ONCE DAILY. 90 capsule 0   ondansetron (ZOFRAN-ODT) 4 MG disintegrating tablet Take 4 mg by mouth every 8 (eight) hours as needed for nausea or vomiting.     potassium chloride SA (KLOR-CON M) 20 MEQ tablet Take 20 mEq by mouth 2 (  two) times daily.     No current facility-administered medications for this visit.   Facility-Administered Medications Ordered in Other Visits  Medication Dose Route Frequency Provider Last Rate Last Admin   dexamethasone (DECADRON) injection 8 mg  8 mg Intravenous Once Boisseau, Hayley, PA         Past Surgical History:  Procedure Laterality Date   ANAL FISSURE REPAIR  1972   APPENDECTOMY     BACK SURGERY  1985   CARDIAC CATHETERIZATION  [phone]   Stent to the proximal RCA after MI    CARDIAC CATHETERIZATION N/A 09/26/2016   Procedure: Left Heart Cath and Coronary Angiography;  Surgeon: Marykay Lex, MD;  Location: [facility] INVASIVE CV LAB;  Service: Cardiovascular;  Laterality: N/A;   CATARACT EXTRACTION W/PHACO Right 07/04/2014   Procedure: CATARACT EXTRACTION PHACO AND INTRAOCULAR LENS PLACEMENT (IOC);  Surgeon: Loraine Leriche T. Nile Riggs, MD;  Location: [facility];  Service: Ophthalmology;  Laterality: Right;  CDE:13.13   COLONOSCOPY     DILATION AND CURETTAGE OF UTERUS     x2   Epi Retinal Membrane Peel Left    ERCP     ESOPHAGEAL BANDING N/A 04/01/2013   Procedure: ESOPHAGEAL BANDING;  Surgeon: Malissa Hippo, MD;  Location: [facility];  Service: Endoscopy;  Laterality: N/A;   ESOPHAGEAL BANDING N/A 05/24/2013   Procedure: ESOPHAGEAL BANDING;  Surgeon: Malissa Hippo, MD;  Location: [facility];  Service: Endoscopy;  Laterality: N/A;   ESOPHAGEAL BANDING N/A 06/21/2014   Procedure: ESOPHAGEAL BANDING;  Surgeon: Malissa Hippo, MD;  Location: [facility];  Service: Endoscopy;  Laterality: N/A;   ESOPHAGEAL BANDING  06/15/2020   Procedure: ESOPHAGEAL BANDING;  Surgeon: Dolores Frame, MD;  Location: [facility];  Service:  Gastroenterology;;   ESOPHAGOGASTRODUODENOSCOPY N/A 04/01/2013   Procedure: ESOPHAGOGASTRODUODENOSCOPY (EGD);  Surgeon: Malissa Hippo, MD;  Location: [facility];  Service: Endoscopy;  Laterality: N/A;  230-rescheduled to 8:30am Ann notified pt   ESOPHAGOGASTRODUODENOSCOPY N/A 05/24/2013   Procedure: ESOPHAGOGASTRODUODENOSCOPY (EGD);  Surgeon: Malissa Hippo, MD;  [facility]: [facility];  Service: Endoscopy;  Laterality: N/A;  730   ESOPHAGOGASTRODUODENOSCOPY N/A 06/21/2014   Procedure: ESOPHAGOGASTRODUODENOSCOPY (EGD);  Surgeon: Malissa Hippo, MD;  [facility]: [facility];  Service: Endoscopy;  Laterality: N/A;  930-rescheduled 10/14 @ 1200 Ann to notify pt   ESOPHAGOGASTRODUODENOSCOPY (EGD) WITH PROPOFOL N/A 06/15/2020   Procedure: ESOPHAGOGASTRODUODENOSCOPY (EGD) WITH PROPOFOL;  Surgeon: Dolores Frame, MD;  [facility];  Service: Gastroenterology;  Laterality: N/A;  230   ESOPHAGOGASTRODUODENOSCOPY (EGD) WITH PROPOFOL N/A 07/27/2020   Procedure: ESOPHAGOGASTRODUODENOSCOPY (EGD) WITH PROPOFOL;  Surgeon: Dolores Frame, MD;  [facility]: [facility];  Service: Gastroenterology;  Laterality: N/A;  1245   ESOPHAGOGASTRODUODENOSCOPY (EGD) WITH PROPOFOL N/A 08/19/2022   Procedure: ESOPHAGOGASTRODUODENOSCOPY (EGD) WITH PROPOFOL;  Surgeon: Dolores Frame, MD;  Location: [facility];  Service: Gastroenterology;  Laterality: N/A;  130 ASA 3, pt knows to arrive at 6:15   EYE SURGERY  08   cataract surgery of the left eye   FEMUR IM NAIL Left 03/02/2017   Procedure: INTRAMEDULLARY (IM) NAIL FEMORAL;  Surgeon: Samson Frederic, MD;  Location: [facility];  Service: Orthopedics;  Laterality: Left;   HARDWARE REMOVAL Right 01/17/2013   Procedure: REMOVAL OF HARDWARE AND EXCISION ULNAR STYLOID RIGHT WRIST;  Surgeon: Tami Ribas, MD;  Location:  SURGERY CENTER;  Service: Orthopedics;  Laterality: Right;   implanted heart monitor  02/2020  to monitor Afib    IR 3D INDEPENDENT WKST  09/26/2022   IR ANGIOGRAM SELECTIVE EACH ADDITIONAL VESSEL  09/26/2022   IR ANGIOGRAM SELECTIVE EACH ADDITIONAL VESSEL  09/26/2022   IR ANGIOGRAM SELECTIVE EACH ADDITIONAL VESSEL  09/26/2022   IR ANGIOGRAM SELECTIVE EACH ADDITIONAL VESSEL  09/26/2022   IR ANGIOGRAM SELECTIVE EACH ADDITIONAL VESSEL  09/26/2022   IR ANGIOGRAM SELECTIVE EACH ADDITIONAL VESSEL  10/10/2022   IR ANGIOGRAM SELECTIVE EACH ADDITIONAL VESSEL  10/10/2022   IR ANGIOGRAM SELECTIVE EACH ADDITIONAL VESSEL  10/10/2022   IR ANGIOGRAM VISCERAL SELECTIVE  09/26/2022   IR ANGIOGRAM VISCERAL SELECTIVE  10/10/2022   IR EMBO TUMOR ORGAN ISCHEMIA INFARCT INC GUIDE ROADMAPPING  10/10/2022   IR INTRAVASCULAR ULTRASOUND NON CORONARY  09/26/2022   IR RADIOLOGIST EVAL & MGMT  09/10/2022   IR RADIOLOGIST EVAL & MGMT  11/03/2022   IR RADIOLOGIST EVAL & MGMT  02/10/2023   IR RADIOLOGIST EVAL & MGMT  05/15/2023   IR TRANSCATH PLC STENT 1ST ART NOT LE CV CAR VERT CAR  09/26/2022   IR US GUIDE VASC ACCESS RIGHT  09/26/2022   IR US GUIDE VASC ACCESS RIGHT  10/10/2022   MYRINGOTOMY  2012   both ears   ORIF FEMUR FRACTURE Left 04/23/2017   Procedure: OPEN REDUCTION INTERNAL FIXATION (ORIF) DISTAL FEMUR FRACTURE (FRACTURE AROUND FEMORAL NAIL);  Surgeon: Myrene Galas, MD;  Location: [facility] OR;  Service: Orthopedics;  Laterality: Left;   TONSILLECTOMY     TUBAL LIGATION     VAGINAL HYSTERECTOMY  1972   WRIST SURGERY     rt wrist hardwear removal     Allergies  Allergen Reactions   Tape Rash and Other (See Comments)    Old paper tape caused a reaction, doesn't react to most other tapes       Family History  Problem Relation Age of Onset   Diabetes Mother    Heart failure Father    Heart failure Maternal Aunt      Social History [name] reports that she quit smoking about 11 months ago. Her smoking use included cigarettes. She started smoking about 50 years ago. She has a 12.5 pack-year smoking history. She has been exposed to  tobacco smoke. She has never used smokeless tobacco. [name] reports no history of alcohol use.   Review of Systems CONSTITUTIONAL: No weight loss, fever, chills, weakness or fatigue.  HEENT: Eyes: No visual loss, blurred vision, double vision or yellow sclerae.No hearing loss, sneezing, congestion, runny nose or sore throat.  SKIN: No rash or itching.  CARDIOVASCULAR:  RESPIRATORY: No shortness of breath, cough or sputum.  GASTROINTESTINAL: No anorexia, nausea, vomiting or diarrhea. No abdominal pain or blood.  GENITOURINARY: No burning on urination, no polyuria NEUROLOGICAL: No headache, dizziness, syncope, paralysis, ataxia, numbness or tingling in the extremities. No change in bowel or bladder control.  MUSCULOSKELETAL: No muscle, back pain, joint pain or stiffness.  LYMPHATICS: No enlarged nodes. No history of splenectomy.  PSYCHIATRIC: No history of depression or anxiety.  ENDOCRINOLOGIC: No reports of sweating, cold or heat intolerance. No polyuria or polydipsia.  Marland Kitchen   Physical Examination There were no vitals filed for this visit. There were no vitals filed for this visit.  Gen: resting comfortably, no acute distress HEENT: no scleral icterus, pupils equal round and reactive, no palptable cervical adenopathy,  CV Resp: Clear to auscultation bilaterally GI: abdomen is soft, non-tender, non-distended, normal bowel sounds, no hepatosplenomegaly MSK: extremities are warm, no edema.  Skin: warm, no rash Neuro:  no focal deficits Psych: appropriate affect   Diagnostic Studies 06/2018 echo Study Conclusions   - Left ventricle: The cavity size was normal. Wall thickness was   increased in a pattern of moderate LVH. Systolic function was   vigorous. The estimated ejection fraction was in the range of 65%   to 70%. Features are consistent with a pseudonormal left   ventricular filling pattern, with concomitant abnormal relaxation   and increased filling pressure (grade 2  diastolic dysfunction). - Aortic valve: Mildly calcified annulus. There was moderate to   severe regurgitation. - Mitral valve: There was mild to moderate regurgitation. - Left atrium: The atrium was severely dilated.   Impressions:   - Technically difficult echo   No parasternal views.     LV systolic function   The aortic insufficiency is at least moderate - severe in   severity .     08/2018 carotid US Summary: Right Carotid: Velocities in the right ICA are consistent with a 1-39% stenosis.   Left Carotid: Velocities in the left ICA are consistent with a 1-39% stenosis,               calcific plaque may obscure higher velicity.   12/2019 echo 1. Left ventricular ejection fraction, by estimation, is 60 to 65%. The  left ventricle has normal function. The left ventricle has no regional  wall motion abnormalities. There is mild left ventricular hypertrophy.  Left ventricular diastolic parameters  are consistent with Grade II diastolic dysfunction (pseudonormalization).  Elevated left atrial pressure.   2. Right ventricular systolic function is normal. The right ventricular  size is normal. There is mildly elevated pulmonary artery systolic  pressure.   3. Left atrial size was severely dilated.   4. The mitral valve was not well visualized. Mild mitral valve  regurgitation. Moderate mitral stenosis.MV peak gradient, 13.4 mmHg. The  mean mitral valve gradient is 5.0 mmHg.   5. The aortic valve was not well visualized. Aortic valve regurgitation  is moderate. Moderate aortic valve stenosis.   6. The inferior vena cava is normal in size with greater than 50%  respiratory variability, suggesting right atrial pressure of 3 mmHg.    02/2021 echo IMPRESSIONS     1. Left ventricular ejection fraction, by estimation, is 60 to 65%. The  left ventricle has normal function. The left ventricle has no regional  wall motion abnormalities. There is mild left ventricular hypertrophy.   Left ventricular diastolic parameters  are indeterminate.   2. Right ventricular systolic function is normal. The right ventricular  size is normal.   3. Left atrial size was severely dilated.   4. Right atrial size was moderately dilated.   5. Previous MV gradients on TTE 01/05/20 mean 5 peak 13.4 mmHg at a HR of  68 bpm Gradients similar today at higher HR of 100 bpm MS primarily from  MAC especially posterior annulus and subchordal area . The mitral valve is  degenerative. Trivial mitral  valve regurgitation. Moderate mitral stenosis. Severe mitral annular  calcification.   6. Gradients have increased since echo 01/05/20 Degree of AR similar . The  aortic valve is tricuspid. There is moderate calcification of the aortic  valve. Aortic valve regurgitation is moderate. Moderate to severe aortic  valve stenosis.   7. The inferior vena cava is dilated in size with <50% respiratory  variability, suggesting right atrial pressure of 15 mmHg.     Assessment and Plan  1. Afib/prior PSVT/acquired thrombophlia - no symptoms, continue current meds. Just meets weight and age criteria to continue eliquis at 5mg  bid dosing.     2.  CAD -no recent symptoms, continue current meds   3.Aortic stenosis/Mitral stenosis -paradoxical low flow low gradient mod to severe AS.  - no symptoms though sedentary due to chronic generalized weakness - consider repeat echo over time pending clinical course of her liver disease and heptaocellular carcinoma, I doubt ultimately would ever be a candidate for valve replacement.    4. Carotid stenosis - repeat US later this year pending overall clinical course.    5. Hyperlipidemia - at goal, continue current meds   6. HTN - elevated here but has not taken meds, has been at goal       Antoine Poche, M.D., F.A.C.C.

## 2023-06-09 NOTE — Patient Instructions (Addendum)
[facility]  Discharge Instructions  You were seen and examined today by Dr. Ellin Saba.  Dr. Ellin Saba discussed your most recent lab work and CT scan which revealed that everything looks good except your cancer markers elevated and are not corresponding with the scan. The cancer markers can be falsely elevated though.   Dr. Ellin Saba recommends repeating scan and labs before your next appointment.  Follow-up as scheduled in 2 months.    Thank you for choosing [facility] to provide your oncology and hematology care.   To afford each patient quality time with our provider, please arrive at least 15 minutes before your scheduled appointment time. You may need to reschedule your appointment if you arrive late (10 or more minutes). Arriving late affects you and other patients whose appointments are after yours.  Also, if you miss three or more appointments without notifying the office, you may be dismissed from the clinic at the provider's discretion.    Again, thank you for choosing [facility].  Our hope is that these requests will decrease the amount of time that you wait before being seen by our physicians.   If you have a lab appointment with the Cancer Center - please note that after April 8th, all labs will be drawn in the cancer center.  You do not have to check in or register with the main entrance as you have in the past but will complete your check-in at the cancer center.            _____________________________________________________________  Should you have questions after your visit to [facility], please contact our office at [phone] and follow the prompts.  Our office hours are 8:00 a.m. to 4:30 p.m. Monday - Thursday and 8:00 a.m. to 2:30 p.m. Friday.  Please note that voicemails left after 4:00 p.m. may not be returned until the following business day.  We are closed weekends and all  major holidays.  You do have access to a nurse 24-7, just call the main number to the clinic [phone] and do not press any options, hold on the line and a nurse will answer the phone.    For prescription refill requests, have your pharmacy contact our office and allow 72 hours.    Masks are no longer required in the cancer centers. If you would like for your care team to wear a mask while they are taking care of you, please let them know. You may have one support person who is at least 81 years old accompany you for your appointments.

## 2023-06-09 NOTE — Progress Notes (Signed)
[facility] [address] [place] [address]    Clinic Day:  06/09/2023  Referring physician: Elfredia Nevins, MD  Patient Care Team: Elfredia Nevins, MD as PCP - General (Internal Medicine) Wyline Mood, Dorothe Pea, MD as PCP - Cardiology (Cardiology) Ferman Hamming, DPM as Consulting Physician (Podiatry) Rehman, Joline Maxcy, MD (Inactive) as Consulting Physician (Gastroenterology) Newman Pies, MD as Consulting Physician (Otolaryngology) Beryle Beams, MD as Consulting Physician (Neurology) Samson Frederic, MD as Consulting Physician (Orthopedic Surgery) Darreld Mclean, MD as Consulting Physician (Orthopedic Surgery) Vickki Hearing, MD as Consulting Physician (Orthopedic Surgery) Jethro Bolus, MD as Consulting Physician (Ophthalmology) Sherren Kerns, MD (Inactive) as Consulting Physician (Vascular Surgery)   ASSESSMENT & PLAN:   Assessment: 1.  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  Lost 40 pounds in the last 4 years but did not lose any weight in the last 6 months. - CT CAP (04/12/2022): Cirrhosis with 4.5 cm hyperenhancing mass in the left lobe of liver.  Portal hypertension.  Extensive varices and splenomegaly. - MRI liver (07/24/2022): 4.8 x 2.5 cm segment 2/3 mass with arterial phase hyperenhancement.  Differential includes cholangiocarcinoma/mixed tumor/HCC.  No other liver lesions. - US guided left liver lobe mass biopsy on 08/15/2022 - Pathology: Well-differentiated HCC - AFP: 203 (07/01/2022), CA 19-9: 16 - 10/10/2022: Transarterial Y90 radioembolization of segment 2   2.  Social/family history: - She lives at home with her granddaughter.  Son lives across the street.  She worked as a Media planner prior to retirement.  Quit smoking 2 months ago.  Smokes 1 pack/week for the last 65 years. - No family history of  malignancies.    Plan: 1.  Stage II (T2 N0 M0) well-differentiated HCC: - She does not report any abdominal pain.  Her weight and functional status are stable. - MRI abdomen (05/08/2023): Continued decrease in size of nonenhancing lesion of the posterior left lobe of the liver, hepatic segments 2/3.  No contrast-enhancement associated with it.  Findings are consistent with treatment response.  No new liver lesions seen.  Cirrhosis with large splenic varices are normal spleen size. - Her AFP was elevated at 1528 on 05/04/2023, significant change from previous values. - Repeat AFP on 06/02/2023 was 2492. - We reviewed PET scan from 05/28/2023: Advised cirrhotic changes with no hypermetabolic liver lesions.  No findings to suggest metastatic disease in the chest, abdomen and pelvis.  Progressive bilateral pleural effusions, right more than left. - Not clear the etiology of significant decrease in the AFP.  As the scans does not show progression, we will closely monitor. - RTC 2 months for follow-up with repeat MRI of the abdomen with and without contrast and AFP level.  Orders Placed This Encounter  Procedures   MR Abdomen W Wo Contrast    Standing Status:   Future    Standing Expiration Date:   06/08/2024    Order Specific Question:   If indicated for the ordered procedure, I authorize the administration of contrast media per Radiology protocol    Answer:   Yes    Order Specific Question:   What is the patient's sedation requirement?    Answer:   No Sedation    Order Specific Question:   Does the patient have a pacemaker or implanted devices?    Answer:   No    Order Specific Question:  Preferred imaging location?    Answer:   [facility] (table limit [phone])    Order Specific Question:   Release to patient    Answer:   Immediate   AFP tumor marker    Standing Status:   Future    Standing Expiration Date:   06/08/2024   CBC with Differential/Platelet    Standing Status:   Future     Standing Expiration Date:   06/08/2024    Order Specific Question:   Release to patient    Answer:   Immediate   Comprehensive metabolic panel    Standing Status:   Future    Standing Expiration Date:   06/08/2024    Order Specific Question:   Release to patient    Answer:   Immediate      I,Helena R Teague,acting as a scribe for [name], MD.,have documented all relevant documentation on the behalf of [name], MD,as directed by  [name], MD while in the presence of [name], MD.  I, [name] MD, have reviewed the above documentation for accuracy and completeness, and I agree with the above.    [name], MD   10/1/20244:30 PM  CHIEF COMPLAINT:   Diagnosis: Well-differentiated hepatocellular carcinoma    Cancer Staging  Cancer, hepatocellular (HCC) Staging form: Liver, AJCC 8th Edition - Clinical stage from 08/21/2022: Stage II (cT2, cN0, cM0) - Unsigned    Prior Therapy: Y90 radioembolization 10/10/22  Current Therapy: Observation   HISTORY OF PRESENT ILLNESS:   Oncology History   No history exists.     INTERVAL HISTORY:   [name] is a 82 y.o. female presenting to clinic today for follow up of well-differentiated hepatocellular carcinoma. She was last seen by me on 02/03/23.  Since her last visit, she finished Y 90 treatment with Dr. Elby Showers. She had an initial PET on 05/28/23 that found: advanced cirrhotic changes involving the liver but no hypermetabolic hepatic lesions; no findings for metastatic disease involving the chest, abdomen/pelvis or bony structures; progressive bilateral pleural effusions, right larger than left; and stable advanced vascular disease.  Today, she states that she is doing well overall. Her appetite level is at 0%. Her energy level is at 0%. She is accompanied by her daughter.  She has a decreased appetite and has lost some weight. She has not had any changes in activity levels  recently, and mostly lies in bed. She denies any abdominal pain. She c/o occasional pain in the upper left leg. She denies any new infections. She does not have a follow-up with radiology.   PAST MEDICAL HISTORY:   Past Medical History: Past Medical History:  Diagnosis Date   Anemia    Anxiety    Aortic insufficiency    Moderate   Atrial fibrillation (HCC)    Back pain    Carotid stenosis, bilateral    Cirrhosis (HCC)    Closed left femoral fracture (HCC) 04/21/2017   Coronary artery disease    Stent x 2 RCA 1995, cardiac catheterization 09/2016 showing only mild atherosclerosis   DDD (degenerative disc disease), lumbar    Depression    Diabetes mellitus without complication (HCC)    Essential hypertension    Falls    GERD (gastroesophageal reflux disease)    Hiatal hernia    History of stroke    Hypercholesteremia    IBS (irritable bowel syndrome)    Non-alcoholic fatty liver disease    OSA (obstructive sleep apnea)    no  CPAP   Pericardial effusion    a. small by echo 2018.   PSVT (paroxysmal supraventricular tachycardia)    Recurrent UTI    Seizures (HCC) 2018   Stroke (HCC) 07/2019   several strokes within the last 10 years   Swallowing difficulty    Varices, esophageal (HCC)     Surgical History: Past Surgical History:  Procedure Laterality Date   ANAL FISSURE REPAIR  1972   APPENDECTOMY     BACK SURGERY  1985   CARDIAC CATHETERIZATION  [phone]   Stent to the proximal RCA after MI    CARDIAC CATHETERIZATION N/A 09/26/2016   Procedure: Left Heart Cath and Coronary Angiography;  Surgeon: Marykay Lex, MD;  Location: [facility] INVASIVE CV LAB;  Service: Cardiovascular;  Laterality: N/A;   CATARACT EXTRACTION W/PHACO Right 07/04/2014   Procedure: CATARACT EXTRACTION PHACO AND INTRAOCULAR LENS PLACEMENT (IOC);  Surgeon: Loraine Leriche T. Nile Riggs, MD;  Location: [facility];  Service: Ophthalmology;  Laterality: Right;  CDE:13.13   COLONOSCOPY     DILATION AND CURETTAGE OF  UTERUS     x2   Epi Retinal Membrane Peel Left    ERCP     ESOPHAGEAL BANDING N/A 04/01/2013   Procedure: ESOPHAGEAL BANDING;  Surgeon: Malissa Hippo, MD;  Location: [facility];  Service: Endoscopy;  Laterality: N/A;   ESOPHAGEAL BANDING N/A 05/24/2013   Procedure: ESOPHAGEAL BANDING;  Surgeon: Malissa Hippo, MD;  Location: [facility];  Service: Endoscopy;  Laterality: N/A;   ESOPHAGEAL BANDING N/A 06/21/2014   Procedure: ESOPHAGEAL BANDING;  Surgeon: Malissa Hippo, MD;  Location: [facility];  Service: Endoscopy;  Laterality: N/A;   ESOPHAGEAL BANDING  06/15/2020   Procedure: ESOPHAGEAL BANDING;  Surgeon: Dolores Frame, MD;  Location: [facility];  Service: Gastroenterology;;   ESOPHAGOGASTRODUODENOSCOPY N/A 04/01/2013   Procedure: ESOPHAGOGASTRODUODENOSCOPY (EGD);  Surgeon: Malissa Hippo, MD;  Location: [facility];  Service: Endoscopy;  Laterality: N/A;  230-rescheduled to 8:30am Ann notified pt   ESOPHAGOGASTRODUODENOSCOPY N/A 05/24/2013   Procedure: ESOPHAGOGASTRODUODENOSCOPY (EGD);  Surgeon: Malissa Hippo, MD;  [facility]: [facility];  Service: Endoscopy;  Laterality: N/A;  730   ESOPHAGOGASTRODUODENOSCOPY N/A 06/21/2014   Procedure: ESOPHAGOGASTRODUODENOSCOPY (EGD);  Surgeon: Malissa Hippo, MD;  [facility];  Service: Endoscopy;  Laterality: N/A;  930-rescheduled 10/14 @ 1200 Ann to notify pt   ESOPHAGOGASTRODUODENOSCOPY (EGD) WITH PROPOFOL N/A 06/15/2020   Procedure: ESOPHAGOGASTRODUODENOSCOPY (EGD) WITH PROPOFOL;  Surgeon: Dolores Frame, MD;  [facility];  Service: Gastroenterology;  Laterality: N/A;  230   ESOPHAGOGASTRODUODENOSCOPY (EGD) WITH PROPOFOL N/A 07/27/2020   Procedure: ESOPHAGOGASTRODUODENOSCOPY (EGD) WITH PROPOFOL;  Surgeon: Dolores Frame, MD;  [facility];  Service: Gastroenterology;  Laterality: N/A;  1245   ESOPHAGOGASTRODUODENOSCOPY (EGD) WITH PROPOFOL N/A 08/19/2022    Procedure: ESOPHAGOGASTRODUODENOSCOPY (EGD) WITH PROPOFOL;  Surgeon: Dolores Frame, MD;  [facility]: [facility];  Service: Gastroenterology;  Laterality: N/A;  130 ASA 3, pt knows to arrive at 6:15   EYE SURGERY  08   cataract surgery of the left eye   FEMUR IM NAIL Left 03/02/2017   Procedure: INTRAMEDULLARY (IM) NAIL FEMORAL;  Surgeon: Samson Frederic, MD;  Location: [facility] OR;  Service: Orthopedics;  Laterality: Left;   HARDWARE REMOVAL Right 01/17/2013   Procedure: REMOVAL OF HARDWARE AND EXCISION ULNAR STYLOID RIGHT WRIST;  Surgeon: Tami Ribas, MD;  Location: [facility];  Service: Orthopedics;  Laterality: Right;  implanted heart monitor  02/2020   to monitor Afib   IR 3D INDEPENDENT WKST  09/26/2022   IR ANGIOGRAM SELECTIVE EACH ADDITIONAL VESSEL  09/26/2022   IR ANGIOGRAM SELECTIVE EACH ADDITIONAL VESSEL  09/26/2022   IR ANGIOGRAM SELECTIVE EACH ADDITIONAL VESSEL  09/26/2022   IR ANGIOGRAM SELECTIVE EACH ADDITIONAL VESSEL  09/26/2022   IR ANGIOGRAM SELECTIVE EACH ADDITIONAL VESSEL  09/26/2022   IR ANGIOGRAM SELECTIVE EACH ADDITIONAL VESSEL  10/10/2022   IR ANGIOGRAM SELECTIVE EACH ADDITIONAL VESSEL  10/10/2022   IR ANGIOGRAM SELECTIVE EACH ADDITIONAL VESSEL  10/10/2022   IR ANGIOGRAM VISCERAL SELECTIVE  09/26/2022   IR ANGIOGRAM VISCERAL SELECTIVE  10/10/2022   IR EMBO TUMOR ORGAN ISCHEMIA INFARCT INC GUIDE ROADMAPPING  10/10/2022   IR INTRAVASCULAR ULTRASOUND NON CORONARY  09/26/2022   IR RADIOLOGIST EVAL & MGMT  09/10/2022   IR RADIOLOGIST EVAL & MGMT  11/03/2022   IR RADIOLOGIST EVAL & MGMT  02/10/2023   IR RADIOLOGIST EVAL & MGMT  05/15/2023   IR TRANSCATH PLC STENT 1ST ART NOT LE CV CAR VERT CAR  09/26/2022   IR US GUIDE VASC ACCESS RIGHT  09/26/2022   IR US GUIDE VASC ACCESS RIGHT  10/10/2022   MYRINGOTOMY  2012   both ears   ORIF FEMUR FRACTURE Left 04/23/2017   Procedure: OPEN REDUCTION INTERNAL FIXATION (ORIF) DISTAL FEMUR FRACTURE (FRACTURE AROUND FEMORAL NAIL);  Surgeon:  Myrene Galas, MD;  Location[facility];  Service: Orthopedics;  Laterality: Left;   TONSILLECTOMY     TUBAL LIGATION     VAGINAL HYSTERECTOMY  1972   WRIST SURGERY     rt wrist hardwear removal    Social History: Social History   Socioeconomic History   Marital status: Widowed    Spouse name: Not on file   Number of children: 2   Years of education: 2 years college   Highest education level: Some college, no degree  Occupational History    Employer: RETIRED  Tobacco Use   Smoking status: Former    Current packs/day: 0.00    Average packs/day: 0.3 packs/day for 50.0 years (12.5 ttl pk-yrs)    Types: Cigarettes    Start date: 07/09/1972    Quit date: 07/09/2022    Years since quitting: 0.9    Passive exposure: Current   Smokeless tobacco: Never   Tobacco comments:    12/23/21 1 pack Q two weeks.  Vaping Use   Vaping status: Never Used  Substance and Sexual Activity   Alcohol use: No    Alcohol/week: 0.0 standard drinks of alcohol   Drug use: No   Sexual activity: Not Currently    Birth control/protection: Surgical  Other Topics Concern   Not on file  Social History Narrative   12/23/21  Lives with 3 grandchildren, sone and dgtr-in -law are across the st   Social Determinants of Health   Financial Resource Strain: Low Risk  (09/15/2022)   Overall Financial Resource Strain (CARDIA)    Difficulty of Paying Living Expenses: Not hard at all  Food Insecurity: No Food Insecurity (09/26/2021)   Hunger Vital Sign    Worried About Running Out of Food in the Last Year: Never true    Ran Out of Food in the Last Year: Never true  Transportation Needs: No Transportation Needs (09/15/2022)   PRAPARE - Administrator, Civil Service (Medical): No    Lack of Transportation (Non-Medical): No  Physical Activity: Inactive (03/16/2019)   Exercise Vital Sign  Days of Exercise per Week: 0 days    Minutes of Exercise per Session: 0 min  Stress: No Stress Concern Present (03/16/2019)    [employer] of Occupational Health - Occupational Stress Questionnaire    Feeling of Stress : Not at all  Social Connections: Moderately Isolated (03/16/2019)   Social Connection and Isolation Panel [NHANES]    Frequency of Communication with Friends and Family: More than three times a week    Frequency of Social Gatherings with Friends and Family: Never    Attends Religious Services: 1 to 4 times per year    Active Member of [employer] or Organizations: No    Attends Banker Meetings: Never    Marital Status: Widowed  Intimate Partner Violence: Not At Risk (03/16/2019)   Humiliation, Afraid, Rape, and Kick questionnaire    Fear of Current or Ex-Partner: No    Emotionally Abused: No    Physically Abused: No    Sexually Abused: No    Family History: Family History  Problem Relation Age of Onset   Diabetes Mother    Heart failure Father    Heart failure Maternal Aunt     Current Medications:  Current Outpatient Medications:    acetaminophen (TYLENOL) 500 MG tablet, Take 1,000 mg by mouth at bedtime., Disp: , Rfl:    apixaban (ELIQUIS) 5 MG TABS tablet, TAKE (1) TABLET BY MOUTH TWICE DAILY., Disp: 60 tablet, Rfl: 5   aspirin EC 81 MG tablet, Take 1 tablet (81 mg total) by mouth daily. Swallow whole., Disp: 30 tablet, Rfl: 12   atorvastatin (LIPITOR) 80 MG tablet, Take 1 tablet (80 mg total) by mouth daily. (Patient taking differently: Take 80 mg by mouth at bedtime.), Disp: 30 tablet, Rfl: 1   azelastine (ASTELIN) 0.1 % nasal spray, Place 1 spray into both nostrils at bedtime., Disp: , Rfl:    CALCIUM CITRATE PO, Take 200 mg by mouth daily., Disp: , Rfl:    carvedilol (COREG) 6.25 MG tablet, TAKE (1) TABLET BY MOUTH TWICE DAILY WITH A MEAL., Disp: 180 tablet, Rfl: 3   Cholecalciferol (VITAMIN D3) 50 MCG (2000 UT) TABS, Take 2,000 Units by mouth daily., Disp: , Rfl:    Coenzyme Q10 (CO Q-10) 100 MG CAPS, Take 100 mg by mouth in the morning., Disp: , Rfl:    diltiazem  (CARDIZEM CD) 180 MG 24 hr capsule, TAKE (1) CAPSULE BY MOUTH ONCE DAILY., Disp: 90 capsule, Rfl: 1   doxepin (SINEQUAN) 25 MG capsule, Take 25 mg by mouth at bedtime., Disp: , Rfl:    furosemide (LASIX) 40 MG tablet, Take 0.5 tablets (20 mg total) by mouth daily. TAKE (1) TABLET BY MOUTH ONCE DAILY. MAY TAKE ADDITIONAL HALF IF NEEDED FOR SEVERE SWELLING. Strength: 20 mg (Patient taking differently: Take 20-40 mg by mouth See admin instructions. Alternate taking 20 mg one day and 40 mg the next), Disp: 45 tablet, Rfl: 0   lactulose (CHRONULAC) 10 GM/15ML solution, Take 30 g by mouth daily at 6 (six) AM., Disp: , Rfl:    Multiple Vitamin (MULTIVITAMIN WITH MINERALS) TABS tablet, Take 1 tablet by mouth daily., Disp: , Rfl:    nitroGLYCERIN (NITROSTAT) 0.4 MG SL tablet, Place 0.4 mg under the tongue every 5 (five) minutes as needed for chest pain., Disp: , Rfl:    omeprazole (PRILOSEC) 40 MG capsule, TAKE (1) CAPSULE BY MOUTH ONCE DAILY., Disp: 90 capsule, Rfl: 0   ondansetron (ZOFRAN-ODT) 4 MG disintegrating tablet, Take 4 mg by  mouth every 8 (eight) hours as needed for nausea or vomiting., Disp: , Rfl:    potassium chloride SA (KLOR-CON M) 20 MEQ tablet, Take 20 mEq by mouth 2 (two) times daily., Disp: , Rfl:  No current facility-administered medications for this visit.  Facility-Administered Medications Ordered in Other Visits:    dexamethasone (DECADRON) injection 8 mg, 8 mg, Intravenous, Once, Boisseau, Hayley, PA   Allergies: Allergies  Allergen Reactions   Tape Rash and Other (See Comments)    Old paper tape caused a reaction, doesn't react to most other tapes     REVIEW OF SYSTEMS:   Review of Systems  Constitutional:  Negative for chills, fatigue and fever.  HENT:   Negative for lump/mass, mouth sores, nosebleeds, sore throat and trouble swallowing.   Eyes:  Negative for eye problems.  Respiratory:  Positive for shortness of breath. Negative for cough.   Cardiovascular:  Negative  for chest pain, leg swelling and palpitations.  Gastrointestinal:  Negative for abdominal pain, constipation, diarrhea, nausea and vomiting.  Genitourinary:  Negative for bladder incontinence, difficulty urinating, dysuria, frequency, hematuria and nocturia.   Musculoskeletal:  Positive for myalgias (in left leg, 5/10 severity). Negative for arthralgias, back pain, flank pain and neck pain.  Skin:  Negative for itching and rash.  Neurological:  Negative for dizziness, headaches and numbness.  Hematological:  Does not bruise/bleed easily.  Psychiatric/Behavioral:  Negative for depression, sleep disturbance and suicidal ideas. The patient is not nervous/anxious.   All other systems reviewed and are negative.    VITALS:   Blood pressure (!) 133/56, pulse 68, temperature 97.8 F (36.6 C), temperature source Oral, resp. rate 18, SpO2 99%.  Wt Readings from Last 3 Encounters:  05/04/23 128 lb 4.8 oz (58.2 kg)  01/01/23 139 lb 3.2 oz (63.1 kg)  11/03/22 130 lb 4.8 oz (59.1 kg)    There is no height or weight on file to calculate BMI.  Performance status (ECOG): 1 - Symptomatic but completely ambulatory  PHYSICAL EXAM:   Physical Exam Vitals and nursing note reviewed. Exam conducted with a chaperone present.  Constitutional:      Appearance: Normal appearance.  Cardiovascular:     Rate and Rhythm: Normal rate and regular rhythm.     Pulses: Normal pulses.     Heart sounds: Normal heart sounds.  Pulmonary:     Effort: Pulmonary effort is normal.     Breath sounds: Normal breath sounds.  Abdominal:     Palpations: Abdomen is soft. There is no hepatomegaly, splenomegaly or mass.     Tenderness: There is no abdominal tenderness.  Musculoskeletal:     Right lower leg: No edema.     Left lower leg: No edema.  Lymphadenopathy:     Cervical: No cervical adenopathy.     Right cervical: No superficial, deep or posterior cervical adenopathy.    Left cervical: No superficial, deep or  posterior cervical adenopathy.     Upper Body:     Right upper body: No supraclavicular or axillary adenopathy.     Left upper body: No supraclavicular or axillary adenopathy.  Neurological:     General: No focal deficit present.     Mental Status: She is alert and oriented to person, place, and time.  Psychiatric:        Mood and Affect: Mood normal.        Behavior: Behavior normal.     LABS:      Latest Ref Rng & Units 06/02/2023  9:55 AM 05/04/2023    2:29 PM 12/25/2022    1:08 PM  CBC  WBC 4.0 - 10.5 K/uL 4.5  6.0  4.3   Hemoglobin 12.0 - 15.0 g/dL 25.9  56.3  87.5   Hematocrit 36.0 - 46.0 % 37.7  38.5  37.6   Platelets 150 - 400 K/uL 145  153  132       Latest Ref Rng & Units 06/02/2023    9:55 AM 05/04/2023    2:29 PM 12/25/2022    1:08 PM  CMP  Glucose 70 - 99 mg/dL 643  329  518   BUN 8 - 23 mg/dL 15  12  12    Creatinine 0.44 - 1.00 mg/dL 8.41  6.60  6.30   Sodium 135 - 145 mmol/L 140  140  137   Potassium 3.5 - 5.1 mmol/L 4.5  4.2  3.8   Chloride 98 - 111 mmol/L 104  103  106   CO2 22 - 32 mmol/L 27  25  24    Calcium 8.9 - 10.3 mg/dL 9.0  8.6  8.8   Total Protein 6.5 - 8.1 g/dL 5.8  5.3  6.2   Total Bilirubin 0.3 - 1.2 mg/dL 1.8  1.5  1.0   Alkaline Phos 38 - 126 U/L 138   162   AST 15 - 41 U/L 38  52  39   ALT 0 - 44 U/L 23  25  23       No results found for: "CEA1", "CEA" / No results found for: "CEA1", "CEA" No results found for: "PSA1" Lab Results  Component Value Date   [id number] 16 07/29/2022   No results found for: "NAT557"  No results found for: "TOTALPROTELP", "ALBUMINELP", "A1GS", "A2GS", "BETS", "BETA2SER", "GAMS", "MSPIKE", "SPEI" No results found for: "TIBC", "FERRITIN", "IRONPCTSAT" No results found for: "LDH"   STUDIES:   NM PET Image Initial (PI) Skull Base To Thigh (F-18 FDG)  Result Date: 06/08/2023 CLINICAL DATA:  Subsequent treatment strategy for hepatocellular carcinoma. Patient is status post Y 90 treatment. EXAM: NUCLEAR  MEDICINE PET SKULL BASE TO THIGH TECHNIQUE: 6.4 mCi F-18 FDG was injected intravenously. Full-ring PET imaging was performed from the skull base to thigh after the radiotracer. CT data was obtained and used for attenuation correction and anatomic localization. Fasting blood glucose: 113 mg/dl COMPARISON:  Multiple prior imaging studies. Most recent MRI is 05/08/2023. FINDINGS: Mediastinal blood pool activity: SUV max 2.18 Liver activity: SUV max NA NECK: No hypermetabolic lymph nodes in the neck. Incidental CT findings: Bilateral carotid artery calcifications. CHEST: No hypermetabolic mediastinal or hilar nodes. No suspicious pulmonary nodules on the CT scan. No hypermetabolic breast lesions, supraclavicular or axillary adenopathy. Incidental CT findings: Progressive bilateral pleural effusions large on the right and moderate on the left. No hypermetabolism to suggest a pathologic process. Fluid also noted in the right minor fissure. Right middle lobe atelectasis medially. Extensive vascular disease with aortic and coronary artery calcifications. Mitral valve annulus calcifications. ABDOMEN/PELVIS: Advanced cirrhotic changes involving the liver but no hypermetabolic hepatic lesions. Specifically, the left hepatic lobe lesions seen on prior imaging studies is not show any hypermetabolism. No enlarged or hypermetabolic abdominal or pelvic lymph nodes. Incidental CT findings: Stable advanced vascular disease. No ascites. SKELETON: No focal hypermetabolic activity to suggest skeletal metastasis. Incidental CT findings: Stable advanced degenerative changes involving the spine and hips. Bilateral pars defects at L5 with a grade 1 spondylolisthesis. IMPRESSION: 1. Advanced cirrhotic changes involving the liver but no  hypermetabolic hepatic lesions. 2. No findings for metastatic disease involving the chest, abdomen/pelvis or bony structures. 3. Progressive bilateral pleural effusions, right larger than left. 4. Stable  advanced vascular disease. Aortic Atherosclerosis (ICD10-I70.0). Electronically Signed   By: Rudie Meyer M.D.   On: 06/08/2023 10:31   CUP PACEART REMOTE DEVICE CHECK  Result Date: 06/02/2023 ILR summary report received. Battery status OK. Normal device function. No new symptom, tachy, or episodes. Monthly summary reports and ROV/PRN.  Persistent AF, overall controlled rates, Eliquis per PA report 1 logged brady event, 9 logged pause events, available EGM shows AF with SVR, nocturnal LA, CVRS  IR Radiologist Eval & Mgmt  Result Date: 05/15/2023 EXAM: ESTABLISHED PATIENT OFFICE VISIT CHIEF COMPLAINT: See Epic note. HISTORY OF PRESENT ILLNESS: See Epic note. REVIEW OF SYSTEMS: See Epic note. PHYSICAL EXAMINATION: See Epic note. ASSESSMENT AND PLAN: See Epic note. Marliss Coots, MD [facility] Electronically Signed   By: Marliss Coots M.D.   On: 05/15/2023 11:20

## 2023-06-16 ENCOUNTER — Encounter: Payer: Self-pay | Admitting: Student

## 2023-06-16 ENCOUNTER — Ambulatory Visit: Payer: Medicare Other | Attending: Cardiology | Admitting: Student

## 2023-06-16 VITALS — BP 122/54 | HR 108 | Ht 63.0 in | Wt 134.0 lb

## 2023-06-16 DIAGNOSIS — E782 Mixed hyperlipidemia: Secondary | ICD-10-CM | POA: Diagnosis not present

## 2023-06-16 DIAGNOSIS — Z79899 Other long term (current) drug therapy: Secondary | ICD-10-CM

## 2023-06-16 DIAGNOSIS — C220 Liver cell carcinoma: Secondary | ICD-10-CM | POA: Diagnosis not present

## 2023-06-16 DIAGNOSIS — I350 Nonrheumatic aortic (valve) stenosis: Secondary | ICD-10-CM

## 2023-06-16 DIAGNOSIS — I2510 Atherosclerotic heart disease of native coronary artery without angina pectoris: Secondary | ICD-10-CM | POA: Diagnosis not present

## 2023-06-16 DIAGNOSIS — I4819 Other persistent atrial fibrillation: Secondary | ICD-10-CM | POA: Diagnosis not present

## 2023-06-16 DIAGNOSIS — I10 Essential (primary) hypertension: Secondary | ICD-10-CM | POA: Diagnosis not present

## 2023-06-16 MED ORDER — FUROSEMIDE 40 MG PO TABS: 40.0000 mg | ORAL_TABLET | Freq: Every day | ORAL | 11 refills | Status: DC

## 2023-06-16 NOTE — Patient Instructions (Signed)
Medication Instructions:   Take Coreg and Cardizem when you get home  Call tomorrow with Blood Pressure and heart rate.   *If you need a refill on your cardiac medications before your next appointment, please call your pharmacy*   Lab Work: Your physician recommends that you return for lab work in: 2 Weeks   If you have labs (blood work) drawn today and your tests are completely normal, you will receive your results only by: MyChart Message (if you have MyChart) OR A paper copy in the mail If you have any lab test that is abnormal or we need to change your treatment, we will call you to review the results.   Testing/Procedures: NONE    Follow-Up: At [facility], you and your health needs are our priority.  As part of our continuing mission to provide you with exceptional heart care, we have created designated Provider Care Teams.  These Care Teams include your primary Cardiologist (physician) and Advanced Practice Providers (APPs -  Physician Assistants and Nurse Practitioners) who all work together to provide you with the care you need, when you need it.  We recommend signing up for the patient portal called "MyChart".  Sign up information is provided on this After Visit Summary.  MyChart is used to connect with patients for Virtual Visits (Telemedicine).  Patients are able to view lab/test results, encounter notes, upcoming appointments, etc.  Non-urgent messages can be sent to your provider as well.   To learn more about what you can do with MyChart, go to [email or web address].    Your next appointment:   6 -8 week(s)  Provider:   Dina Rich, MD or Randall An, PA-C    Other Instructions Thank you for choosing [facility]!

## 2023-06-16 NOTE — Progress Notes (Signed)
Cardiology Office Note    Date:  06/16/2023  ID:  [name], DOB Sep 10, 1940, MRN [id number] Cardiologist: Dina Rich, MD   EP: Dr. Ladona Ridgel  History of Present Illness:    [name] is a 82 y.o. female with past medical history of persistent atrial fibrillation, SVT, CAD (prior stents to the RCA in 1995 with catheterization in 09/2016 showing nonobstructive disease), carotid artery stenosis, aortic stenosis, cirrhosis, hepatocellular carcinoma, HTN, HLD and prior CVA who presents to the office today for overdue 75-month follow-up.  She was examined by Dr. Wyline Mood in 10/2022 and denied any recent palpitations at that time. Most recent echocardiogram had shown severe low-flow/low gradient AS and was recommend to consider a repeat echocardiogram over time pending the clinical course of her cirrhosis and hepatocellular carcinoma but it was felt she would likely not be a candidate for valve replacement.  In talking with the patient and her daughter today, she reports worsening lower extremity edema over the past few weeks. She has been taking an extra 10 mg of Lasix but has experienced persistent symptoms. Mostly wheelchair bound and does not elevate her extremities routinely. Her daughter reports she has the "diet of a toddler" and does not consume protein regularly. She remains on Eliquis for anticoagulation with no reports of active bleeding. Heart rate was initially elevated in the 130's today and improved to 108 bpm on recheck. Her daughter reports she slept in this morning and has not yet taken her Carvedilol or Cardizem CD and she also missed her doses last night. They report this is not a routine thing as she is usually very compliant with medications. She did report an episode of chest pain during her visit today which lasted for a few seconds and spontaneously resolved (possibly rate-related given brief episode and EKG showed no acute ST changes).   Studies Reviewed:   EKG: EKG is  ordered today and demonstrates:   EKG Interpretation Date/Time:  Tuesday June 16 2023 14:17:56 EDT Ventricular Rate:  130 PR Interval:    QRS Duration:  80 QT Interval:  354 QTC Calculation: 520 R Axis:   -53  Text Interpretation: Atrial fibrillation with rapid ventricular response Low voltage QRS Left anterior fascicular block Confirmed by Randall An ([id number]) on 06/16/2023 5:12:16 PM       Cardiac Catheterization: 09/2016 The left ventricular systolic function is normal. LV end diastolic pressure is normal. The left ventricular ejection fraction is 55-65% by visual estimate. There is mild aortic valve stenosis. Mid RCA lesion, 25 %stenosed. Ost Ramus lesion, 30 %stenosed.     Essentially minimal coronary artery disease withlesions. There is calcification of the mitral valve annulus and mild callus complication the proximal LAD. There does appear to be a stent in the RCA that is essentially patent.   Nothing to explain the patient's positive troponin.   Plan: Return to nursing unit for ongoing care and TR band removal Further care per primary team  Echocardiogram: 08/2022 IMPRESSIONS     1. Left ventricular ejection fraction, by estimation, is >75%. The left  ventricle has hyperdynamic function. The left ventricle has no regional  wall motion abnormalities. There is mild concentric left ventricular  hypertrophy. Left ventricular diastolic  parameters are indeterminate.   2. Right ventricular systolic function is normal. The right ventricular  size is normal. There is mildly elevated pulmonary artery systolic  pressure. The estimated right ventricular systolic pressure is 41.6 mmHg.   3. Left atrial size was severely dilated.  4. Right atrial size was mild to moderately dilated.   5. The mitral valve is degenerative, Carpentier class IIIa. Moderate  mitral valve regurgitation. The mean mitral valve gradient is 3.0 mmHg.  Severe mitral annular calcification.    6. The aortic valve has an indeterminant number of cusps, possibly  functionally bicuspid. There is severe calcifcation of the aortic valve.  Aortic valve regurgitation is moderate. Severe aortic valve stenosis, low  flow/low gradient with preserved LVEF.  Aortic regurgitation PHT measures 355 msec. Aortic valve area, by VTI  measures 0.91 cm. Aortic valve mean gradient measures 24.0 mmHg.  Dimentionless index 0.29.   7. The inferior vena cava is dilated in size with >50% respiratory  variability, suggesting right atrial pressure of 8 mmHg.   Comparison(s): Prior images reviewed side by side. LVEF vigorous with  evidence of moderate mitral regurgitation in the setting of degenerative  mitral valve with severe annular calcification as well as severe aortic  stenosis (low flow/low gradient with  preserved LVEF). Moderate aortic regurgitation.     Risk Assessment/Calculations:    CHA2DS2-VASc Score = 7   This indicates a 11.2% annual risk of stroke. The patient's score is based upon: CHF History: 0 HTN History: 1 Diabetes History: 0 Stroke History: 2 Vascular Disease History: 1 Age Score: 2 Gender Score: 1    Physical Exam:   VS:  BP (!) 122/54   Pulse (!) 108   Ht 5\' 3"  (1.6 m)   Wt 134 lb (60.8 kg)   SpO2 98%   BMI 23.74 kg/m    Wt Readings from Last 3 Encounters:  06/16/23 134 lb (60.8 kg)  05/04/23 128 lb 4.8 oz (58.2 kg)  01/01/23 139 lb 3.2 oz (63.1 kg)     GEN: Pleasant, elderly female appearing in no acute distress NECK: No JVD; No carotid bruits CARDIAC: Irregularly irregular, 3/6 SEM along RUSB.  RESPIRATORY:  Clear to auscultation without rales, wheezing or rhonchi  ABDOMEN: Appears non-distended. No obvious abdominal masses. EXTREMITIES: No clubbing or cyanosis. 2+ pitting edema bilaterally.  Distal pedal pulses are 2+ bilaterally.   Assessment and Plan:   1. Aortic Stenosis - Echocardiogram in 08/2022 showed EF was greater than 75% with  moderate mitral valve regurgitation, moderate aortic valve regurgitation and severe aortic valve stenosis with low-flow low gradient. As discussed in prior notes, she would likely not be a candidate for AVR given her co-morbidities and at this time, she is not keen on pursuing this. She is in agreement for a follow-up echo for reassessment and can arrange at her next visit but doubt this would change her management strategy.  - She does have some volume overload today which could be due to her valvular disease, elevated HR, cirrhosis or HFpEF. Will titrate Lasix from 20mg  daily to 40mg  daily. Will check BNP and BMET in 2 weeks.   2. Persistent Atrial Fibrillation - She denies any recent palpitations but HR was significantly elevated today but improved by recheck. She is prescribed Coreg 6.25mg  BID and Cardizem CD 180mg  daily. I have asked her daughter to call with HR/BP readings tomorrow after she takes her medications. May require dose adjustments pending reassessment.  - No reports of active bleeding. On Eliquis 5mg  BID for anticoagulation and weight is borderline at 134 lbs today. If this continues to decline, dosing will need to be reduced to 2.5mg  BID given her age of 82 yo.   3. CAD - She underwent prior stenting to the RCA 1995  and most recent cardiac catheterization in 09/2016 showed nonobstructive disease. - No recent anginal symptoms. Continue ASA 81mg  daily, Atorvastatin 80mg  daily and Coreg 6.25mg  BID.   4. HTN - BP is well-controlled at 122/54 during today's visit. Will have them call tomorrow with updated vitals after she has taken her routine medications.   5. HLD - Followed by her PCP. She has been continued on Atorvastatin 80mg  daily.   6. Cirrhosis/Hepatocellular Carcinoma - Followed by GI and Oncology. Most recent scans showed decrease in size of her lesions with no evidence of metastatic disease by review of notes.   Signed, Ellsworth Lennox, PA-C

## 2023-06-16 NOTE — Progress Notes (Signed)
Carelink Summary Report / Loop Recorder 

## 2023-06-17 ENCOUNTER — Other Ambulatory Visit: Payer: Self-pay

## 2023-06-17 ENCOUNTER — Telehealth: Payer: Self-pay | Admitting: Student

## 2023-06-17 MED ORDER — FUROSEMIDE 40 MG PO TABS: 40.0000 mg | ORAL_TABLET | Freq: Two times a day (BID) | ORAL | 3 refills | Status: DC

## 2023-06-17 NOTE — Telephone Encounter (Signed)
Left a message for pt's daughter to give office a call back.

## 2023-06-17 NOTE — Telephone Encounter (Signed)
    If she took all of her medications and HR is still at 104 bpm at rest, I would increase Cardizem CD from 180mg  daily to 240mg  daily for improved HR control. Please check HR and BP a few times each week and let us know how this is doing in several weeks.   Signed, Ellsworth Lennox, PA-C 06/17/2023, 4:16 PM

## 2023-06-17 NOTE — Telephone Encounter (Signed)
Called in to give her vitals today after taken her medications. BP was 120/60 and pulse ox 90/104

## 2023-06-18 MED ORDER — DILTIAZEM HCL ER COATED BEADS 240 MG PO CP24: 240.0000 mg | ORAL_CAPSULE | Freq: Every day | ORAL | 3 refills | Status: DC

## 2023-06-18 NOTE — Telephone Encounter (Signed)
Spoke to pt's daughter who verbalized understanding of medication changes. Pt's daughter who continue to check hr/bp and record readings. Pt's daughter will advise our office of readings in several weeks. Pt's daughter had no further questions or concerns at this time.

## 2023-06-18 NOTE — Telephone Encounter (Signed)
error 

## 2023-07-06 LAB — CUP PACEART REMOTE DEVICE CHECK
Date Time Interrogation Session: 20241027231745
Implantable Pulse Generator Implant Date: 20210602

## 2023-07-07 ENCOUNTER — Telehealth: Payer: Self-pay

## 2023-07-07 NOTE — Telephone Encounter (Signed)
ILR @ [facility]   ILR reached [facility]: 06/17/23 Patient called, discussed options with granddaughter [place] who is on [place] to leave device in or explanted. States if patient wants to have device removed she will give [place] a call back.  Marked "I" in Paceart: Yes Enter note in Paceart:Yes Canceled future remotes:Yes Discontinued from website:Yes Entered in Speciality Comments:Yes  Advised if further questions arise to please call the device clinic at [phone].

## 2023-07-30 ENCOUNTER — Encounter (HOSPITAL_COMMUNITY): Payer: Self-pay

## 2023-07-30 ENCOUNTER — Inpatient Hospital Stay (HOSPITAL_COMMUNITY)
Admission: EM | Admit: 2023-07-30 | Discharge: 2023-08-01 | DRG: 065 | Disposition: A | Discharge status: Skilled Nursing Facility | Payer: Medicare Other | Attending: Family Medicine | Admitting: Family Medicine

## 2023-07-30 ENCOUNTER — Emergency Department (HOSPITAL_COMMUNITY): Payer: Medicare Other

## 2023-07-30 ENCOUNTER — Other Ambulatory Visit: Payer: Self-pay

## 2023-07-30 DIAGNOSIS — I352 Nonrheumatic aortic (valve) stenosis with insufficiency: Secondary | ICD-10-CM | POA: Diagnosis not present

## 2023-07-30 DIAGNOSIS — R4701 Aphasia: Secondary | ICD-10-CM | POA: Diagnosis not present

## 2023-07-30 DIAGNOSIS — C220 Liver cell carcinoma: Secondary | ICD-10-CM | POA: Diagnosis present

## 2023-07-30 DIAGNOSIS — Z7901 Long term (current) use of anticoagulants: Secondary | ICD-10-CM | POA: Diagnosis not present

## 2023-07-30 DIAGNOSIS — I10 Essential (primary) hypertension: Secondary | ICD-10-CM | POA: Diagnosis present

## 2023-07-30 DIAGNOSIS — I63412 Cerebral infarction due to embolism of left middle cerebral artery: Secondary | ICD-10-CM | POA: Diagnosis not present

## 2023-07-30 DIAGNOSIS — E1122 Type 2 diabetes mellitus with diabetic chronic kidney disease: Secondary | ICD-10-CM | POA: Diagnosis not present

## 2023-07-30 DIAGNOSIS — Z0389 Encounter for observation for other suspected diseases and conditions ruled out: Secondary | ICD-10-CM | POA: Diagnosis not present

## 2023-07-30 DIAGNOSIS — J90 Pleural effusion, not elsewhere classified: Secondary | ICD-10-CM | POA: Diagnosis present

## 2023-07-30 DIAGNOSIS — K760 Fatty (change of) liver, not elsewhere classified: Secondary | ICD-10-CM | POA: Diagnosis present

## 2023-07-30 DIAGNOSIS — I2510 Atherosclerotic heart disease of native coronary artery without angina pectoris: Secondary | ICD-10-CM | POA: Diagnosis not present

## 2023-07-30 DIAGNOSIS — K219 Gastro-esophageal reflux disease without esophagitis: Secondary | ICD-10-CM | POA: Diagnosis present

## 2023-07-30 DIAGNOSIS — Z8505 Personal history of malignant neoplasm of liver: Secondary | ICD-10-CM

## 2023-07-30 DIAGNOSIS — I5032 Chronic diastolic (congestive) heart failure: Secondary | ICD-10-CM | POA: Diagnosis present

## 2023-07-30 DIAGNOSIS — J9811 Atelectasis: Secondary | ICD-10-CM | POA: Diagnosis not present

## 2023-07-30 DIAGNOSIS — I130 Hypertensive heart and chronic kidney disease with heart failure and stage 1 through stage 4 chronic kidney disease, or unspecified chronic kidney disease: Secondary | ICD-10-CM | POA: Diagnosis present

## 2023-07-30 DIAGNOSIS — Z8673 Personal history of transient ischemic attack (TIA), and cerebral infarction without residual deficits: Secondary | ICD-10-CM

## 2023-07-30 DIAGNOSIS — R918 Other nonspecific abnormal finding of lung field: Secondary | ICD-10-CM | POA: Diagnosis not present

## 2023-07-30 DIAGNOSIS — Z955 Presence of coronary angioplasty implant and graft: Secondary | ICD-10-CM

## 2023-07-30 DIAGNOSIS — K7460 Unspecified cirrhosis of liver: Secondary | ICD-10-CM | POA: Diagnosis not present

## 2023-07-30 DIAGNOSIS — I700 Atherosclerosis of aorta: Secondary | ICD-10-CM | POA: Diagnosis not present

## 2023-07-30 DIAGNOSIS — J918 Pleural effusion in other conditions classified elsewhere: Secondary | ICD-10-CM | POA: Diagnosis not present

## 2023-07-30 DIAGNOSIS — Z833 Family history of diabetes mellitus: Secondary | ICD-10-CM

## 2023-07-30 DIAGNOSIS — N1831 Chronic kidney disease, stage 3a: Secondary | ICD-10-CM | POA: Diagnosis present

## 2023-07-30 DIAGNOSIS — R29701 NIHSS score 1: Secondary | ICD-10-CM | POA: Diagnosis present

## 2023-07-30 DIAGNOSIS — I6389 Other cerebral infarction: Secondary | ICD-10-CM | POA: Diagnosis not present

## 2023-07-30 DIAGNOSIS — Z7982 Long term (current) use of aspirin: Secondary | ICD-10-CM | POA: Diagnosis not present

## 2023-07-30 DIAGNOSIS — I639 Cerebral infarction, unspecified: Secondary | ICD-10-CM | POA: Diagnosis not present

## 2023-07-30 DIAGNOSIS — Z8249 Family history of ischemic heart disease and other diseases of the circulatory system: Secondary | ICD-10-CM | POA: Diagnosis not present

## 2023-07-30 DIAGNOSIS — G9389 Other specified disorders of brain: Secondary | ICD-10-CM | POA: Diagnosis not present

## 2023-07-30 DIAGNOSIS — Z79899 Other long term (current) drug therapy: Secondary | ICD-10-CM

## 2023-07-30 DIAGNOSIS — F0390 Unspecified dementia without behavioral disturbance: Secondary | ICD-10-CM | POA: Diagnosis present

## 2023-07-30 DIAGNOSIS — Z87891 Personal history of nicotine dependence: Secondary | ICD-10-CM

## 2023-07-30 DIAGNOSIS — Z90710 Acquired absence of both cervix and uterus: Secondary | ICD-10-CM

## 2023-07-30 DIAGNOSIS — E7800 Pure hypercholesterolemia, unspecified: Secondary | ICD-10-CM | POA: Diagnosis not present

## 2023-07-30 DIAGNOSIS — R9089 Other abnormal findings on diagnostic imaging of central nervous system: Secondary | ICD-10-CM | POA: Diagnosis not present

## 2023-07-30 DIAGNOSIS — E876 Hypokalemia: Secondary | ICD-10-CM | POA: Diagnosis not present

## 2023-07-30 DIAGNOSIS — E119 Type 2 diabetes mellitus without complications: Secondary | ICD-10-CM

## 2023-07-30 DIAGNOSIS — R531 Weakness: Secondary | ICD-10-CM | POA: Diagnosis not present

## 2023-07-30 DIAGNOSIS — F419 Anxiety disorder, unspecified: Secondary | ICD-10-CM | POA: Diagnosis present

## 2023-07-30 DIAGNOSIS — I4819 Other persistent atrial fibrillation: Secondary | ICD-10-CM | POA: Diagnosis not present

## 2023-07-30 LAB — CBC
HCT: 39.8 % (ref 36.0–46.0)
Hemoglobin: 13.3 g/dL (ref 12.0–15.0)
MCH: 31.7 pg (ref 26.0–34.0)
MCHC: 33.4 g/dL (ref 30.0–36.0)
MCV: 95 fL (ref 80.0–100.0)
Platelets: 137 10*3/uL — ABNORMAL LOW (ref 150–400)
RBC: 4.19 MIL/uL (ref 3.87–5.11)
RDW: 14.6 % (ref 11.5–15.5)
WBC: 6.4 10*3/uL (ref 4.0–10.5)
nRBC: 0 % (ref 0.0–0.2)

## 2023-07-30 LAB — COMPREHENSIVE METABOLIC PANEL
ALT: 21 U/L (ref 0–44)
AST: 38 U/L (ref 15–41)
Albumin: 2.9 g/dL — ABNORMAL LOW (ref 3.5–5.0)
Alkaline Phosphatase: 121 U/L (ref 38–126)
Anion gap: 12 (ref 5–15)
BUN: 14 mg/dL (ref 8–23)
CO2: 23 mmol/L (ref 22–32)
Calcium: 8.7 mg/dL — ABNORMAL LOW (ref 8.9–10.3)
Chloride: 103 mmol/L (ref 98–111)
Creatinine, Ser: 1.09 mg/dL — ABNORMAL HIGH (ref 0.44–1.00)
GFR, Estimated: 51 mL/min — ABNORMAL LOW (ref >60)
Glucose, Bld: 157 mg/dL — ABNORMAL HIGH (ref 70–99)
Potassium: 3.1 mmol/L — ABNORMAL LOW (ref 3.5–5.1)
Sodium: 138 mmol/L (ref 135–145)
Total Bilirubin: 3.8 mg/dL — ABNORMAL HIGH (ref <1.2)
Total Protein: 6.2 g/dL — ABNORMAL LOW (ref 6.5–8.1)

## 2023-07-30 LAB — LACTIC ACID, PLASMA
Lactic Acid, Venous: 2.5 mmol/L (ref 0.5–1.9)
Lactic Acid, Venous: 2.5 mmol/L (ref 0.5–1.9)

## 2023-07-30 LAB — CBG MONITORING, ED: Glucose-Capillary: 127 mg/dL — ABNORMAL HIGH (ref 70–99)

## 2023-07-30 MED ORDER — SODIUM CHLORIDE 0.9 % IV BOLUS
1000.0000 mL | Freq: Once | INTRAVENOUS | Status: AC
  Administered 2023-07-30: 1000 mL via INTRAVENOUS

## 2023-07-30 MED ORDER — POTASSIUM CHLORIDE 20 MEQ PO PACK
20.0000 meq | PACK | Freq: Once | ORAL | Status: AC
  Administered 2023-07-30: 20 meq via ORAL
  Filled 2023-07-30: qty 1

## 2023-07-30 MED ORDER — AZITHROMYCIN 500 MG IV SOLR
500.0000 mg | Freq: Once | INTRAVENOUS | Status: AC
  Administered 2023-07-30: 500 mg via INTRAVENOUS
  Filled 2023-07-30: qty 5

## 2023-07-30 MED ORDER — PANTOPRAZOLE SODIUM 40 MG PO TBEC
40.0000 mg | DELAYED_RELEASE_TABLET | Freq: Every day | ORAL | Status: DC
  Administered 2023-07-31 – 2023-08-01 (×2): 40 mg via ORAL
  Filled 2023-07-30 (×2): qty 1

## 2023-07-30 MED ORDER — FUROSEMIDE 40 MG PO TABS
40.0000 mg | ORAL_TABLET | Freq: Two times a day (BID) | ORAL | Status: DC
  Administered 2023-07-31 – 2023-08-01 (×3): 40 mg via ORAL
  Filled 2023-07-30 (×3): qty 1

## 2023-07-30 MED ORDER — SENNOSIDES-DOCUSATE SODIUM 8.6-50 MG PO TABS
1.0000 | ORAL_TABLET | Freq: Every evening | ORAL | Status: DC | PRN
Start: 2023-07-30 — End: 2023-08-01

## 2023-07-30 MED ORDER — ASPIRIN 81 MG PO TBEC
81.0000 mg | DELAYED_RELEASE_TABLET | Freq: Every day | ORAL | Status: DC
Start: 2023-07-31 — End: 2023-08-01
  Administered 2023-07-31 – 2023-08-01 (×2): 81 mg via ORAL
  Filled 2023-07-30 (×2): qty 1

## 2023-07-30 MED ORDER — ATORVASTATIN CALCIUM 40 MG PO TABS
80.0000 mg | ORAL_TABLET | Freq: Every day | ORAL | Status: DC
  Administered 2023-07-30 – 2023-07-31 (×2): 80 mg via ORAL
  Filled 2023-07-30 (×2): qty 2

## 2023-07-30 MED ORDER — ACETAMINOPHEN 650 MG RE SUPP: 650.0000 mg | RECTAL | Status: DC | PRN

## 2023-07-30 MED ORDER — LACTULOSE 10 GM/15ML PO SOLN
30.0000 g | Freq: Every day | ORAL | Status: DC
  Administered 2023-07-31 – 2023-08-01 (×2): 30 g via ORAL
  Filled 2023-07-30: qty 60

## 2023-07-30 MED ORDER — SODIUM CHLORIDE 0.9 % IV SOLN
2.0000 g | Freq: Once | INTRAVENOUS | Status: AC
  Administered 2023-07-30: 2 g via INTRAVENOUS
  Filled 2023-07-30: qty 20

## 2023-07-30 MED ORDER — ACETAMINOPHEN 325 MG PO TABS: 650.0000 mg | ORAL_TABLET | ORAL | Status: DC | PRN

## 2023-07-30 MED ORDER — STROKE: EARLY STAGES OF RECOVERY BOOK
Freq: Once | Status: AC
  Filled 2023-07-30: qty 1

## 2023-07-30 MED ORDER — DOXEPIN HCL 25 MG PO CAPS
25.0000 mg | ORAL_CAPSULE | Freq: Every day | ORAL | Status: DC
  Administered 2023-07-30 – 2023-07-31 (×2): 25 mg via ORAL
  Filled 2023-07-30 (×3): qty 1

## 2023-07-30 MED ORDER — IOHEXOL 300 MG/ML  SOLN
75.0000 mL | Freq: Once | INTRAMUSCULAR | Status: AC | PRN
  Administered 2023-07-30: 75 mL via INTRAVENOUS

## 2023-07-30 MED ORDER — ACETAMINOPHEN 160 MG/5ML PO SOLN: 650.0000 mg | ORAL | Status: DC | PRN

## 2023-07-30 NOTE — ED Triage Notes (Signed)
Family stated that they think pt is having a stroke since she stopped talking last night around 11-12 midnight  When pt woke up this morning around 11-12 noon she has been out of it.   Pt normally sleeps late because she wonders the house all night   Pt also has a hx of dementia   Pt answering all questions for this RN in triage. Pt aware of name, DOB and location. Pt denies all pain. Pt unsure why family brought to ED  BGL 127 in triage

## 2023-07-30 NOTE — H&P (Signed)
History and Physical    [name] ZOX:[id number] DOB: [birth year] DOA: 07/30/2023  PCP: Elfredia Nevins, MD   Patient coming from: Home   Chief Complaint: AMS   HPI: [name] is a 82 y.o. female with medical history significant for atrial fibrillation on Eliquis, CAD, aortic stenosis, cirrhosis, history of CVA, and hepatocellular carcinoma status post radioembolization who now presents with change in mental status.   Patient reportedly went to bed in her usual state last night at approximately 11 PM but was noted upon waking today to be moving very slowly.  She would make eye contact, but was not responding verbally to family.  This lasted approximately 2 hours before she began speaking again.  She has not been complaining of anything.  She has not appeared to be short of breath.  ED Course: Upon arrival to the ED, patient is found to be afebrile and saturating well on room air with normal heart rate and stable blood pressure.  Labs are most notable for potassium 3.1, creatinine 1.09, albumin 2.9, bilirubin 3.8, and lactic acid 2.5.  MRI brain reveals acute ischemic infarction in left MCA distribution.  Chest x-ray notable for bilateral pleural effusions.  CT chest was ordered and performed but not yet read.  Neurology (Dr. Amada Jupiter) was consulted by the ED physician and recommended holding Eliquis, continuing aspirin, and admitting the patient to [facility] for stroke workup.  She was given 1 L NS, Rocephin, and azithromycin in the ED.  Review of Systems:  ROS limited by patient's clinical condition.  Past Medical History:  Diagnosis Date   Anemia    Anxiety    Aortic insufficiency    Moderate   Atrial fibrillation (HCC)    Back pain    Carotid stenosis, bilateral    Cirrhosis (HCC)    Closed left femoral fracture (HCC) 04/21/2017   Coronary artery disease    Stent x 2 RCA 1995, cardiac catheterization 09/2016 showing only mild atherosclerosis   DDD (degenerative disc  disease), lumbar    Depression    Diabetes mellitus without complication (HCC)    Essential hypertension    Falls    GERD (gastroesophageal reflux disease)    Hiatal hernia    History of stroke    Hypercholesteremia    IBS (irritable bowel syndrome)    Non-alcoholic fatty liver disease    OSA (obstructive sleep apnea)    no CPAP   Pericardial effusion    a. small by echo 2018.   PSVT (paroxysmal supraventricular tachycardia) (HCC)    Recurrent UTI    Seizures (HCC) 2018   Stroke (HCC) 07/2019   several strokes within the last 10 years   Swallowing difficulty    Varices, esophageal (HCC)     Past Surgical History:  Procedure Laterality Date   ANAL FISSURE REPAIR  1972   APPENDECTOMY     BACK SURGERY  1985   CARDIAC CATHETERIZATION  [phone]   Stent to the proximal RCA after MI    CARDIAC CATHETERIZATION N/A 09/26/2016   Procedure: Left Heart Cath and Coronary Angiography;  Surgeon: Marykay Lex, MD;  Location: [facility] INVASIVE CV LAB;  Service: Cardiovascular;  Laterality: N/A;   CATARACT EXTRACTION W/PHACO Right 07/04/2014   Procedure: CATARACT EXTRACTION PHACO AND INTRAOCULAR LENS PLACEMENT (IOC);  Surgeon: Loraine Leriche T. Nile Riggs, MD;  Location: [facility] OR[facility];  Service: Ophthalmology;  Laterality: Right;  CDE:13.13   COLONOSCOPY     DILATION AND CURETTAGE OF UTERUS  x2   Epi Retinal Membrane Peel Left    ERCP     ESOPHAGEAL BANDING N/A 04/01/2013   Procedure: ESOPHAGEAL BANDING;  Surgeon: Malissa Hippo, MD;  Location: [facility] END[facility] SU[facility];  Service: Endoscopy;  Laterality: N/A;   ESOPHAGEAL BANDING N/A 05/24/2013   Procedure: ESOPHAGEAL BANDING;  Surgeon: Malissa Hippo, MD;  Location: [facility];  Service: Endoscopy;  Laterality: N/A;   ESOPHAGEAL BANDING N/A 06/21/2014   Procedure: ESOPHAGEAL BANDING;  Surgeon: Malissa Hippo, MD;  Location: [facility];  Service: Endoscopy;  Laterality: N/A;   ESOPHAGEAL BANDING  06/15/2020   Procedure: ESOPHAGEAL BANDING;  Surgeon:  Dolores Frame, MD;  Location: [facility];  Service: Gastroenterology;;   ESOPHAGOGASTRODUODENOSCOPY N/A 04/01/2013   Procedure: ESOPHAGOGASTRODUODENOSCOPY (EGD);  Surgeon: Malissa Hippo, MD;  Location: [facility];  Service: Endoscopy;  Laterality: N/A;  230-rescheduled to 8:30am Ann notified pt   ESOPHAGOGASTRODUODENOSCOPY N/A 05/24/2013   Procedure: ESOPHAGOGASTRODUODENOSCOPY (EGD);  Surgeon: Malissa Hippo, MD;  Location: [facility];  Service: Endoscopy;  Laterality: N/A;  730   ESOPHAGOGASTRODUODENOSCOPY N/A 06/21/2014   Procedure: ESOPHAGOGASTRODUODENOSCOPY (EGD);  Surgeon: Malissa Hippo, MD;  [facility]: [facility];  Service: Endoscopy;  Laterality: N/A;  930-rescheduled 10/14 @ 1200 Ann to notify pt   ESOPHAGOGASTRODUODENOSCOPY (EGD) WITH PROPOFOL N/A 06/15/2020   Procedure: ESOPHAGOGASTRODUODENOSCOPY (EGD) WITH PROPOFOL;  Surgeon: Dolores Frame, MD;  [facility]: [facility];  Service: Gastroenterology;  Laterality: N/A;  230   ESOPHAGOGASTRODUODENOSCOPY (EGD) WITH PROPOFOL N/A 07/27/2020   Procedure: ESOPHAGOGASTRODUODENOSCOPY (EGD) WITH PROPOFOL;  Surgeon: Dolores Frame, MD;  [facility]: [facility];  Service: Gastroenterology;  Laterality: N/A;  1245   ESOPHAGOGASTRODUODENOSCOPY (EGD) WITH PROPOFOL N/A 08/19/2022   Procedure: ESOPHAGOGASTRODUODENOSCOPY (EGD) WITH PROPOFOL;  Surgeon: Dolores Frame, MD;  Location: [facility];  Service: Gastroenterology;  Laterality: N/A;  130 ASA 3, pt knows to arrive at 6:15   EYE SURGERY  08   cataract surgery of the left eye   FEMUR IM NAIL Left 03/02/2017   Procedure: INTRAMEDULLARY (IM) NAIL FEMORAL;  Surgeon: Samson Frederic, MD;  Location: [facility] OR;  Service: Orthopedics;  Laterality: Left;   HARDWARE REMOVAL Right 01/17/2013   Procedure: REMOVAL OF HARDWARE AND EXCISION ULNAR STYLOID RIGHT WRIST;  Surgeon: Tami Ribas, MD;  Location: [facility];  Service: Orthopedics;   Laterality: Right;   implanted heart monitor  02/2020   to monitor Afib   IR 3D INDEPENDENT WKST  09/26/2022   IR ANGIOGRAM SELECTIVE EACH ADDITIONAL VESSEL  09/26/2022   IR ANGIOGRAM SELECTIVE EACH ADDITIONAL VESSEL  09/26/2022   IR ANGIOGRAM SELECTIVE EACH ADDITIONAL VESSEL  09/26/2022   IR ANGIOGRAM SELECTIVE EACH ADDITIONAL VESSEL  09/26/2022   IR ANGIOGRAM SELECTIVE EACH ADDITIONAL VESSEL  09/26/2022   IR ANGIOGRAM SELECTIVE EACH ADDITIONAL VESSEL  10/10/2022   IR ANGIOGRAM SELECTIVE EACH ADDITIONAL VESSEL  10/10/2022   IR ANGIOGRAM SELECTIVE EACH ADDITIONAL VESSEL  10/10/2022   IR ANGIOGRAM VISCERAL SELECTIVE  09/26/2022   IR ANGIOGRAM VISCERAL SELECTIVE  10/10/2022   IR EMBO TUMOR ORGAN ISCHEMIA INFARCT INC GUIDE ROADMAPPING  10/10/2022   IR INTRAVASCULAR ULTRASOUND NON CORONARY  09/26/2022   IR RADIOLOGIST EVAL & MGMT  09/10/2022   IR RADIOLOGIST EVAL & MGMT  11/03/2022   IR RADIOLOGIST EVAL & MGMT  02/10/2023   IR RADIOLOGIST EVAL & MGMT  05/15/2023   IR TRANSCATH PLC STENT 1ST ART NOT LE CV CAR VERT  CAR  09/26/2022   IR US GUIDE VASC ACCESS RIGHT  09/26/2022   IR US GUIDE VASC ACCESS RIGHT  10/10/2022   MYRINGOTOMY  2012   both ears   ORIF FEMUR FRACTURE Left 04/23/2017   Procedure: OPEN REDUCTION INTERNAL FIXATION (ORIF) DISTAL FEMUR FRACTURE (FRACTURE AROUND FEMORAL NAIL);  Surgeon: Myrene Galas, MD;  Location: [facility] OR;  Service: Orthopedics;  Laterality: Left;   TONSILLECTOMY     TUBAL LIGATION     VAGINAL HYSTERECTOMY  1972   WRIST SURGERY     rt wrist hardwear removal    Social History:   reports that she quit smoking about 12 months ago. Her smoking use included cigarettes. She started smoking about 51 years ago. She has a 12.5 pack-year smoking history. She has been exposed to tobacco smoke. She has never used smokeless tobacco. She reports that she does not drink alcohol and does not use drugs.  Allergies  Allergen Reactions   Tape Rash and Other (See Comments)    Old paper tape caused a  reaction, doesn't react to most other tapes     Family History  Problem Relation Age of Onset   Diabetes Mother    Heart failure Father    Heart failure Maternal Aunt      Prior to Admission medications   Medication Sig Start Date End Date Taking? Authorizing Provider  acetaminophen (TYLENOL) 500 MG tablet Take 1,000 mg by mouth at bedtime.   Yes [provider]  apixaban (ELIQUIS) 5 MG TABS tablet TAKE (1) TABLET BY MOUTH TWICE DAILY. 01/09/23  Yes BranchDorothe Pea, MD  aspirin EC 81 MG tablet Take 1 tablet (81 mg total) by mouth daily. Swallow whole. 09/26/22  Yes Suttle, Thressa Sheller, MD  atorvastatin (LIPITOR) 80 MG tablet Take 1 tablet (80 mg total) by mouth daily. Patient taking differently: Take 80 mg by mouth at bedtime. 07/15/18  Yes Angiulli, Mcarthur Rossetti, PA-C  azelastine (ASTELIN) 0.1 % nasal spray Place 1 spray into both nostrils at bedtime. 11/06/20  Yes [provider]  CALCIUM CITRATE PO Take 200 mg by mouth daily.   Yes [provider]  carvedilol (COREG) 6.25 MG tablet TAKE (1) TABLET BY MOUTH TWICE DAILY WITH A MEAL. 12/10/22  Yes Marguerita Merles, Reuel Boom, MD  Cholecalciferol (VITAMIN D3) 50 MCG (2000 UT) TABS Take 2,000 Units by mouth daily.   Yes [provider]  Coenzyme Q10 (CO Q-10) 100 MG CAPS Take 100 mg by mouth in the morning.   Yes [provider]  diltiazem (CARDIZEM CD) 240 MG 24 hr capsule Take 1 capsule (240 mg total) by mouth daily. 06/18/23  Yes Strader, [place] M, PA-C  doxepin (SINEQUAN) 25 MG capsule Take 25 mg by mouth at bedtime. 06/24/22  Yes [provider]  furosemide (LASIX) 40 MG tablet Take 1 tablet (40 mg total) by mouth 2 (two) times daily. 06/17/23  Yes Strader, [place] M, PA-C  lactulose (CHRONULAC) 10 GM/15ML solution Take 30 g by mouth daily at 6 (six) AM. 08/21/22  Yes [provider]  Multiple Vitamin (MULTIVITAMIN WITH MINERALS) TABS tablet Take 1 tablet by mouth daily.   Yes [provider]  omeprazole (PRILOSEC) 40 MG capsule TAKE (1) CAPSULE BY MOUTH ONCE DAILY. 04/15/23  Yes Carlan, Chelsea L, NP  potassium chloride SA (KLOR-CON M) 20 MEQ tablet Take 20 mEq by mouth 2 (two) times daily. 04/19/22  Yes [provider]  nitroGLYCERIN (NITROSTAT) 0.4 MG SL tablet Place  0.4 mg under the tongue every 5 (five) minutes as needed for chest pain. Patient not taking: Reported on 07/30/2023 08/22/20   [provider]  ondansetron (ZOFRAN-ODT) 4 MG disintegrating tablet Take 4 mg by mouth every 8 (eight) hours as needed for nausea or vomiting. Patient not taking: Reported on 07/30/2023    [provider]    Physical Exam: Vitals:   07/30/23 1413 07/30/23 1415 07/30/23 1821 07/30/23 1821  BP: (!) 131/49   (!) 123/54  Pulse: 88   88  Resp: 18   18  Temp: 98.1 F (36.7 C)  98.3 F (36.8 C) 98.3 F (36.8 C)  TempSrc:   Oral Oral  SpO2: 98%   97%  Weight:  57.2 kg    Height:  5\' 2"  (1.575 m)      Constitutional: NAD, no pallor or diaphoresis   Eyes: PERTLA, lids and conjunctivae normal ENMT: Mucous membranes are moist. Posterior pharynx clear of any exudate or lesions.   Neck: supple, no masses  Respiratory: no wheezing, no crackles. No accessory muscle use.  Cardiovascular: S1 & S2 heard, regular rate and rhythm. No JVD. Abdomen: No distension, no tenderness, soft. Bowel sounds active.  Musculoskeletal: no clubbing / cyanosis. Left leg shortened relative to right.    Skin: no significant rashes, lesions, ulcers. Warm, dry, well-perfused. Neurologic: CN 2-12 grossly intact. Sensation to light touch intact. Strength 5/5 in UEs bilaterally and in RLE, 3-4/5 in LLE. Alert and oriented to person only.  Psychiatric: Calm. Cooperative.    Labs and Imaging on Admission: I have personally reviewed following labs and imaging studies  CBC: Recent Labs  Lab 07/30/23 1450  WBC 6.4  HGB 13.3  HCT 39.8  MCV 95.0  PLT 137*   Basic Metabolic  Panel: Recent Labs  Lab 07/30/23 1450  NA 138  K 3.1*  CL 103  CO2 23  GLUCOSE 157*  BUN 14  CREATININE 1.09*  CALCIUM 8.7*   GFR: Estimated Creatinine Clearance: 32.6 mL/min (A) (by C-G formula based on SCr of 1.09 mg/dL (H)). Liver Function Tests: Recent Labs  Lab 07/30/23 1450  AST 38  ALT 21  ALKPHOS 121  BILITOT 3.8*  PROT 6.2*  ALBUMIN 2.9*   No results for input(s): "LIPASE", "AMYLASE" in the last 168 hours. No results for input(s): "AMMONIA" in the last 168 hours. Coagulation Profile: No results for input(s): "INR", "PROTIME" in the last 168 hours. Cardiac Enzymes: No results for input(s): "CKTOTAL", "CKMB", "CKMBINDEX", "TROPONINI" in the last 168 hours. BNP (last 3 results) No results for input(s): "PROBNP" in the last 8760 hours. HbA1C: No results for input(s): "HGBA1C" in the last 72 hours. CBG: Recent Labs  Lab 07/30/23 1414  GLUCAP 127*   Lipid Profile: No results for input(s): "CHOL", "HDL", "LDLCALC", "TRIG", "CHOLHDL", "LDLDIRECT" in the last 72 hours. Thyroid Function Tests: No results for input(s): "TSH", "T4TOTAL", "FREET4", "T3FREE", "THYROIDAB" in the last 72 hours. Anemia Panel: No results for input(s): "VITAMINB12", "FOLATE", "FERRITIN", "TIBC", "IRON", "RETICCTPCT" in the last 72 hours. Urine analysis:    Component Value Date/Time   COLORURINE YELLOW 04/12/2022 1240   APPEARANCEUR CLEAR 04/12/2022 1240   LABSPEC 1.015 04/12/2022 1240   PHURINE 6.0 04/12/2022 1240   GLUCOSEU NEGATIVE 04/12/2022 1240   HGBUR LARGE (A) 04/12/2022 1240   BILIRUBINUR NEGATIVE 04/12/2022 1240   KETONESUR NEGATIVE 04/12/2022 1240   PROTEINUR 100 (A) 04/12/2022 1240   UROBILINOGEN 0.2 07/09/2015 0930   NITRITE NEGATIVE 04/12/2022 1240  LEUKOCYTESUR NEGATIVE 04/12/2022 1240   Sepsis Labs: @LABRCNTIP (procalcitonin:4,lacticidven:4) ) Recent Results (from the past 240 hour(s))  Blood culture (routine x 2)     Status: None (Preliminary result)    Collection Time: 07/30/23  7:11 PM   Specimen: BLOOD  Result Value Ref Range Status   Specimen Description BLOOD BLOOD LEFT FOREARM  Final   Special Requests   Final    BOTTLES DRAWN AEROBIC AND ANAEROBIC Blood Culture results may not be optimal due to an excessive volume of blood received in culture bottles Performed at [facility], [address] [place], [place] [address]    Culture PENDING  Incomplete   Report Status PENDING  Incomplete  Blood culture (routine x 2)     Status: None (Preliminary result)   Collection Time: 07/30/23  7:11 PM   Specimen: BLOOD  Result Value Ref Range Status   Specimen Description BLOOD BLOOD RIGHT FOREARM  Final   Special Requests   Final    BOTTLES DRAWN AEROBIC AND ANAEROBIC Blood Culture results may not be optimal due to an excessive volume of blood received in culture bottles Performed at [facility], [address] [place], [place] [address]    Culture PENDING  Incomplete   Report Status PENDING  Incomplete     Radiological Exams on Admission: CT Chest W Contrast  Result Date: 07/30/2023 CLINICAL DATA:  Abnormal chest radiograph.  Pulmonary nodule. EXAM: CT CHEST WITH CONTRAST TECHNIQUE: Multidetector CT imaging of the chest was performed during intravenous contrast administration. RADIATION DOSE REDUCTION: This exam was performed according to the departmental dose-optimization program which includes automated exposure control, adjustment of the mA and/or kV according to patient size and/or use of iterative reconstruction technique. CONTRAST:  75mL OMNIPAQUE IOHEXOL 300 MG/ML  SOLN COMPARISON:  Chest CT dated 8523 and radiograph dated 07/30/2023. FINDINGS: Evaluation of this exam is limited due to respiratory motion. Cardiovascular: There is no cardiomegaly or pericardial effusion. Advanced 3 vessel coronary vascular calcification and calcification of the mitral annulus. Moderate atherosclerotic calcification of the thoracic aorta. No aneurysmal  dilatation or dissection. The origins of the great vessels of the aortic arch and the central pulmonary arteries appear patent. Mediastinum/Nodes: No obvious hilar adenopathy. Evaluation however is limited due to consolidative changes of the lungs and pleural effusions. Lymph node anterior to the carina measures 15 mm short axis. The esophagus is grossly unremarkable. No mediastinal fluid collection. Lungs/Pleura: Large right and moderate left pleural effusions. There is partial compressive atelectasis of the lower lobes versus pneumonia. There is near complete collapse of the right middle lobe, new since the prior CT. This is of indeterminate etiology and a centrally occlusive mass is not excluded. Several calcified granuloma noted in the right hilum adjacent to the right middle lobe bronchi. There is no pneumothorax. The central airways remain patent. Upper Abdomen: Cirrhosis.  Dilated upper abdominal collaterals. Musculoskeletal: Osteopenia with degenerative changes. No acute osseous pathology. IMPRESSION: 1. Large right and moderate left pleural effusions. Partial compressive atelectasis of the lower lobes versus pneumonia. 2. Near complete collapse of the right middle lobe, of indeterminate etiology. A centrally occlusive mass is not excluded. Bronchoscopy may provide better evaluation. 3. Advanced 3 vessel coronary vascular calcification. 4. Cirrhosis. 5.  Aortic Atherosclerosis (ICD10-I70.0). Electronically Signed   By: Elgie Collard M.D.   On: 07/30/2023 20:34   DG Chest 2 View  Result Date: 07/30/2023 CLINICAL DATA:  Weakness. EXAM: CHEST - 2 VIEW COMPARISON:  Chest radiograph dated 06/03/2022. FINDINGS: Bilateral pleural  effusions, right greater than left, with associated bibasilar atelectasis. Pneumonia is not excluded. No pneumothorax. Stable cardiac silhouette. A loop recorder device. Atherosclerotic calcification of the aorta. Degenerative changes of the spine. No acute osseous pathology.  IMPRESSION: Bilateral pleural effusions, right greater than left, with associated bibasilar atelectasis. Electronically Signed   By: Elgie Collard M.D.   On: 07/30/2023 19:19   MR BRAIN WO CONTRAST  Result Date: 07/30/2023 CLINICAL DATA:  Initial evaluation for mental status change, unknown cause. EXAM: MRI HEAD WITHOUT CONTRAST MRA HEAD WITHOUT CONTRAST TECHNIQUE: Multiplanar, multi-echo pulse sequences of the brain and surrounding structures were acquired without intravenous contrast. Angiographic images of the Circle of Willis were acquired using MRA technique without intravenous contrast. COMPARISON:  Prior study from 12/06/2021. FINDINGS: MRI HEAD FINDINGS Brain: Age-related cerebral volume loss. Patchy T2/FLAIR hyperintensity involving the periventricular deep white matter both cerebral hemispheres as well as the pons, consistent with chronic small vessel ischemic disease, mild in nature. Encephalomalacia and gliosis involving the right frontal operculum, consistent with a chronic right MCA distribution infarct. Additional small remote left PCA territory infarct involving the left occipital lobe noted. Few scattered remote cerebellar infarcts noted, left greater than right. Few scattered remote lacunar infarcts present about the deep gray nuclei bilaterally. Approximate 3.4 cm area of restricted diffusion involving the anterior left insula and overlying operculum, consistent with acute left MCA distribution infarct. No significant mass effect or associated hemorrhage. No other evidence for acute or subacute ischemia. No other acute intracranial hemorrhage. No significant chronic intracranial blood products. No mass lesion, midline shift or mass effect. No hydrocephalus or extra-axial fluid collection. Pituitary gland and suprasellar region within normal limits. Vascular: Major intracranial vascular flow voids are maintained. Skull and upper cervical spine: Craniocervical junction within normal limits.  Bone marrow signal intensity normal. No scalp soft tissue abnormality. Sinuses/Orbits: Prior bilateral ocular lens replacement. Paranasal sinuses are clear. Large right mastoid effusion noted. Additional trace left mastoid effusion. Visualized nasopharynx unremarkable. Other: None. MRA HEAD FINDINGS Anterior circulation: Examination mildly degraded by motion artifact. Both internal carotid arteries are patent through the siphons without significant stenosis or other abnormality. A1 segments, anterior communicating artery complex common anterior cerebral arteries patent without stenosis. No M1 stenosis or occlusion. Right MCA branches patent and well perfused. On the left, there is a probable acute left M3 occlusion (series 5, image 177), corresponding with the evolving left MCA distribution infarct. Remainder of the left MCA branches remain patent and perfused. Posterior circulation: Both V4 segments patent without stenosis. Both PICA patent at their origins. Basilar widely patent without stenosis. Superior cerebellar arteries patent bilaterally. Both PCAs primarily supplied via the basilar. Left PCA patent to its distal aspect without significant stenosis. Focal severe distal right P2 stenosis (series 1032, image 13). Right PCA otherwise patent as well. Anatomic variants: None significant.  No intracranial aneurysm. IMPRESSION: MRI HEAD: 1. 3.4 cm acute involving the anterior left insula and overlying operculum, left MCA distribution. No associated hemorrhage or significant mass effect. 2. Underlying age-related cerebral volume loss with chronic microvascular ischemic disease, with multiple remote infarcts as above. MRA HEAD: 1. Probable acute left M3 occlusion, corresponding with the evolving left MCA distribution infarct. 2. Focal severe distal right P2 stenosis. 3. Otherwise wide patency of the major intracranial arterial vasculature. Electronically Signed   By: Rise Mu M.D.   On: 07/30/2023 18:44    MR ANGIO HEAD WO CONTRAST  Result Date: 07/30/2023 CLINICAL DATA:  Initial evaluation for mental status change,  unknown cause. EXAM: MRI HEAD WITHOUT CONTRAST MRA HEAD WITHOUT CONTRAST TECHNIQUE: Multiplanar, multi-echo pulse sequences of the brain and surrounding structures were acquired without intravenous contrast. Angiographic images of the Circle of Willis were acquired using MRA technique without intravenous contrast. COMPARISON:  Prior study from 12/06/2021. FINDINGS: MRI HEAD FINDINGS Brain: Age-related cerebral volume loss. Patchy T2/FLAIR hyperintensity involving the periventricular deep white matter both cerebral hemispheres as well as the pons, consistent with chronic small vessel ischemic disease, mild in nature. Encephalomalacia and gliosis involving the right frontal operculum, consistent with a chronic right MCA distribution infarct. Additional small remote left PCA territory infarct involving the left occipital lobe noted. Few scattered remote cerebellar infarcts noted, left greater than right. Few scattered remote lacunar infarcts present about the deep gray nuclei bilaterally. Approximate 3.4 cm area of restricted diffusion involving the anterior left insula and overlying operculum, consistent with acute left MCA distribution infarct. No significant mass effect or associated hemorrhage. No other evidence for acute or subacute ischemia. No other acute intracranial hemorrhage. No significant chronic intracranial blood products. No mass lesion, midline shift or mass effect. No hydrocephalus or extra-axial fluid collection. Pituitary gland and suprasellar region within normal limits. Vascular: Major intracranial vascular flow voids are maintained. Skull and upper cervical spine: Craniocervical junction within normal limits. Bone marrow signal intensity normal. No scalp soft tissue abnormality. Sinuses/Orbits: Prior bilateral ocular lens replacement. Paranasal sinuses are clear. Large right  mastoid effusion noted. Additional trace left mastoid effusion. Visualized nasopharynx unremarkable. Other: None. MRA HEAD FINDINGS Anterior circulation: Examination mildly degraded by motion artifact. Both internal carotid arteries are patent through the siphons without significant stenosis or other abnormality. A1 segments, anterior communicating artery complex common anterior cerebral arteries patent without stenosis. No M1 stenosis or occlusion. Right MCA branches patent and well perfused. On the left, there is a probable acute left M3 occlusion (series 5, image 177), corresponding with the evolving left MCA distribution infarct. Remainder of the left MCA branches remain patent and perfused. Posterior circulation: Both V4 segments patent without stenosis. Both PICA patent at their origins. Basilar widely patent without stenosis. Superior cerebellar arteries patent bilaterally. Both PCAs primarily supplied via the basilar. Left PCA patent to its distal aspect without significant stenosis. Focal severe distal right P2 stenosis (series 1032, image 13). Right PCA otherwise patent as well. Anatomic variants: None significant.  No intracranial aneurysm. IMPRESSION: MRI HEAD: 1. 3.4 cm acute involving the anterior left insula and overlying operculum, left MCA distribution. No associated hemorrhage or significant mass effect. 2. Underlying age-related cerebral volume loss with chronic microvascular ischemic disease, with multiple remote infarcts as above. MRA HEAD: 1. Probable acute left M3 occlusion, corresponding with the evolving left MCA distribution infarct. 2. Focal severe distal right P2 stenosis. 3. Otherwise wide patency of the major intracranial arterial vasculature. Electronically Signed   By: Rise Mu M.D.   On: 07/30/2023 18:44     Assessment/Plan   1. Acute ischemic CVA  - Continue cardiac monitoring and frequent neuro checks, check lipids, A1c, carotid US, and echocardiogram, hold  Eliquis, continue ASA and Lipitor, keep NPO pending swallow screen, consult PT/OT/SLP    2. Pleural effusions  - Large right and moderate-sized left pleural effusions present on admission; these were small on CT in August   - Consult IR for therapeutic/diagnostic thoracentesis    3. Cirrhosis; HCC   - Continue Lasix, lactulose    4. Atrial fibrillation  - Hold Eliquis for now per neurology recommendations  5. HTN  - Permit HTN in acute-phase of ischemic CVA    6. CKD 3A  - Appears close to baseline  - Renally-dose medications   7. CAD - No anginal complaints  - Continue ASA and Lipitor    8. Chronic HFpEF; aortic stenosis   - Appears compensated  - Continue Lasix, monitor volume status   9. Hypokalemia  - Replacing     DVT prophylaxis: SCDs  Code Status: Full  Level of Care: Level of care: Telemetry Family Communication: Daughter in law at bedside   Disposition Plan:  Patient is from: home  Anticipated d/c is to: TBD Anticipated d/c date is: 11/22 or 08/01/23  Patient currently: Pending CVA workup Consults called: Neurology  Admission status: Observation     Briscoe Deutscher, MD [facility]  07/30/2023, 8:47 PM

## 2023-07-30 NOTE — Progress Notes (Signed)
I did patient swallow screen and she passed and took all of her medications with no problem. Then later I was in her room and she was drinking some water and she did cough. Will keep patient NPO until further evaluation.

## 2023-07-30 NOTE — ED Provider Notes (Signed)
[place] EMERGENCY DEPARTMENT AT [facility] Provider Note   CSN: [id number] Arrival date & time: 07/30/23  1358     History  Chief Complaint  Patient presents with   Altered Mental Status    [name] is a 82 y.o. female.  Patient has a history of diabetes and previous stroke and dementia.  According to her caregiver the patient awoke this morning and would not talk  The history is provided by a relative. No language interpreter was used.  Altered Mental Status Presenting symptoms: behavior changes   Severity:  Severe Most recent episode:  Today Episode history:  Continuous Timing:  Constant Progression:  Unable to specify Chronicity:  New Context: not alcohol use   Associated symptoms: no abdominal pain        Home Medications Prior to Admission medications   Medication Sig Start Date End Date Taking? Authorizing Provider  acetaminophen (TYLENOL) 500 MG tablet Take 1,000 mg by mouth at bedtime.   Yes [provider]  apixaban (ELIQUIS) 5 MG TABS tablet TAKE (1) TABLET BY MOUTH TWICE DAILY. 01/09/23  Yes BranchDorothe Pea, MD  aspirin EC 81 MG tablet Take 1 tablet (81 mg total) by mouth daily. Swallow whole. 09/26/22  Yes Suttle, Thressa Sheller, MD  atorvastatin (LIPITOR) 80 MG tablet Take 1 tablet (80 mg total) by mouth daily. Patient taking differently: Take 80 mg by mouth at bedtime. 07/15/18  Yes Angiulli, Mcarthur Rossetti, PA-C  azelastine (ASTELIN) 0.1 % nasal spray Place 1 spray into both nostrils at bedtime. 11/06/20  Yes [provider]  CALCIUM CITRATE PO Take 200 mg by mouth daily.   Yes [provider]  carvedilol (COREG) 6.25 MG tablet TAKE (1) TABLET BY MOUTH TWICE DAILY WITH A MEAL. 12/10/22  Yes Marguerita Merles, Reuel Boom, MD  Cholecalciferol (VITAMIN D3) 50 MCG (2000 UT) TABS Take 2,000 Units by mouth daily.   Yes [provider]  Coenzyme Q10 (CO Q-10) 100 MG CAPS Take 100 mg by mouth in the morning.   Yes [provider]  diltiazem (CARDIZEM CD) 240 MG 24 hr capsule Take 1 capsule (240 mg total) by mouth daily. 06/18/23  Yes Strader, [place] M, PA-C  doxepin (SINEQUAN) 25 MG capsule Take 25 mg by mouth at bedtime. 06/24/22  Yes [provider]  furosemide (LASIX) 40 MG tablet Take 1 tablet (40 mg total) by mouth 2 (two) times daily. 06/17/23  Yes Strader, [place] M, PA-C  lactulose (CHRONULAC) 10 GM/15ML solution Take 30 g by mouth daily at 6 (six) AM. 08/21/22  Yes [provider]  Multiple Vitamin (MULTIVITAMIN WITH MINERALS) TABS tablet Take 1 tablet by mouth daily.   Yes [provider]  omeprazole (PRILOSEC) 40 MG capsule TAKE (1) CAPSULE BY MOUTH ONCE DAILY. 04/15/23  Yes Carlan, Chelsea L, NP  potassium chloride SA (KLOR-CON M) 20 MEQ tablet Take 20 mEq by mouth 2 (two) times daily. 04/19/22  Yes [provider]  nitroGLYCERIN (NITROSTAT) 0.4 MG SL tablet Place 0.4 mg under the tongue every 5 (five) minutes as needed for chest pain. Patient not taking: Reported on 07/30/2023 08/22/20   [provider]  ondansetron (ZOFRAN-ODT) 4 MG disintegrating tablet Take 4 mg by mouth every 8 (eight) hours as needed for nausea or vomiting. Patient not taking: Reported on 07/30/2023    [provider]      Allergies    Tape    Review of Systems   Review of Systems  Unable to perform ROS: Mental status change  Gastrointestinal:  Negative for abdominal pain.    Physical Exam Updated Vital Signs BP (!) 123/54 (BP Location: Right Arm)   Pulse 88   Temp 98.3 F (36.8 C) (Oral)   Resp 18   Ht 5\' 2"  (1.575 m)   Wt 57.2 kg   SpO2 97%   BMI 23.05 kg/m  Physical Exam Vitals and nursing note reviewed.  Constitutional:      Appearance: She is well-developed.  HENT:     Head: Normocephalic.  Eyes:     General: No scleral icterus.    Conjunctiva/sclera: Conjunctivae normal.  Neck:     Thyroid: No thyromegaly.  Cardiovascular:     Rate and  Rhythm: Normal rate and regular rhythm.     Heart sounds: No murmur heard.    No friction rub. No gallop.  Pulmonary:     Breath sounds: No stridor. No wheezing or rales.  Chest:     Chest wall: No tenderness.  Abdominal:     General: There is no distension.     Tenderness: There is no abdominal tenderness. There is no rebound.  Musculoskeletal:        General: Normal range of motion.     Cervical back: Neck supple.  Lymphadenopathy:     Cervical: No cervical adenopathy.  Skin:    Findings: No erythema or rash.  Neurological:     Motor: No abnormal muscle tone.     Coordination: Coordination normal.     Comments: Patient is awake but not verbal     ED Results / Procedures / Treatments   Labs (all labs ordered are listed, but only abnormal results are displayed) Labs Reviewed  COMPREHENSIVE METABOLIC PANEL - Abnormal; Notable for the following components:      Result Value   Potassium 3.1 (*)    Glucose, Bld 157 (*)    Creatinine, Ser 1.09 (*)    Calcium 8.7 (*)    Total Protein 6.2 (*)    Albumin 2.9 (*)    Total Bilirubin 3.8 (*)    GFR, Estimated 51 (*)    All other components within normal limits  CBC - Abnormal; Notable for the following components:   Platelets 137 (*)    All other components within normal limits  LACTIC ACID, PLASMA - Abnormal; Notable for the following components:   Lactic Acid, Venous 2.5 (*)    All other components within normal limits  LACTIC ACID, PLASMA - Abnormal; Notable for the following components:   Lactic Acid, Venous 2.5 (*)    All other components within normal limits  CBG MONITORING, ED - Abnormal; Notable for the following components:   Glucose-Capillary 127 (*)    All other components within normal limits  CULTURE, BLOOD (ROUTINE X 2)  CULTURE, BLOOD (ROUTINE X 2)  URINALYSIS, ROUTINE W REFLEX MICROSCOPIC  CBG MONITORING, ED    EKG None  Radiology CT Chest W Contrast  Result Date: 07/30/2023 CLINICAL DATA:   Abnormal chest radiograph.  Pulmonary nodule. EXAM: CT CHEST WITH CONTRAST TECHNIQUE: Multidetector CT imaging of the chest was performed during intravenous contrast administration. RADIATION DOSE REDUCTION: This exam was performed according to the departmental dose-optimization program which includes automated exposure control, adjustment of the mA and/or kV according to patient size and/or use of iterative reconstruction technique. CONTRAST:  75mL OMNIPAQUE IOHEXOL 300 MG/ML  SOLN COMPARISON:  Chest CT dated 8523 and radiograph dated 07/30/2023. FINDINGS: Evaluation of this exam  is limited due to respiratory motion. Cardiovascular: There is no cardiomegaly or pericardial effusion. Advanced 3 vessel coronary vascular calcification and calcification of the mitral annulus. Moderate atherosclerotic calcification of the thoracic aorta. No aneurysmal dilatation or dissection. The origins of the great vessels of the aortic arch and the central pulmonary arteries appear patent. Mediastinum/Nodes: No obvious hilar adenopathy. Evaluation however is limited due to consolidative changes of the lungs and pleural effusions. Lymph node anterior to the carina measures 15 mm short axis. The esophagus is grossly unremarkable. No mediastinal fluid collection. Lungs/Pleura: Large right and moderate left pleural effusions. There is partial compressive atelectasis of the lower lobes versus pneumonia. There is near complete collapse of the right middle lobe, new since the prior CT. This is of indeterminate etiology and a centrally occlusive mass is not excluded. Several calcified granuloma noted in the right hilum adjacent to the right middle lobe bronchi. There is no pneumothorax. The central airways remain patent. Upper Abdomen: Cirrhosis.  Dilated upper abdominal collaterals. Musculoskeletal: Osteopenia with degenerative changes. No acute osseous pathology. IMPRESSION: 1. Large right and moderate left pleural effusions. Partial  compressive atelectasis of the lower lobes versus pneumonia. 2. Near complete collapse of the right middle lobe, of indeterminate etiology. A centrally occlusive mass is not excluded. Bronchoscopy may provide better evaluation. 3. Advanced 3 vessel coronary vascular calcification. 4. Cirrhosis. 5.  Aortic Atherosclerosis (ICD10-I70.0). Electronically Signed   By: Elgie Collard M.D.   On: 07/30/2023 20:34   DG Chest 2 View  Result Date: 07/30/2023 CLINICAL DATA:  Weakness. EXAM: CHEST - 2 VIEW COMPARISON:  Chest radiograph dated 06/03/2022. FINDINGS: Bilateral pleural effusions, right greater than left, with associated bibasilar atelectasis. Pneumonia is not excluded. No pneumothorax. Stable cardiac silhouette. A loop recorder device. Atherosclerotic calcification of the aorta. Degenerative changes of the spine. No acute osseous pathology. IMPRESSION: Bilateral pleural effusions, right greater than left, with associated bibasilar atelectasis. Electronically Signed   By: Elgie Collard M.D.   On: 07/30/2023 19:19   MR BRAIN WO CONTRAST  Result Date: 07/30/2023 CLINICAL DATA:  Initial evaluation for mental status change, unknown cause. EXAM: MRI HEAD WITHOUT CONTRAST MRA HEAD WITHOUT CONTRAST TECHNIQUE: Multiplanar, multi-echo pulse sequences of the brain and surrounding structures were acquired without intravenous contrast. Angiographic images of the Circle of Willis were acquired using MRA technique without intravenous contrast. COMPARISON:  Prior study from 12/06/2021. FINDINGS: MRI HEAD FINDINGS Brain: Age-related cerebral volume loss. Patchy T2/FLAIR hyperintensity involving the periventricular deep white matter both cerebral hemispheres as well as the pons, consistent with chronic small vessel ischemic disease, mild in nature. Encephalomalacia and gliosis involving the right frontal operculum, consistent with a chronic right MCA distribution infarct. Additional small remote left PCA territory  infarct involving the left occipital lobe noted. Few scattered remote cerebellar infarcts noted, left greater than right. Few scattered remote lacunar infarcts present about the deep gray nuclei bilaterally. Approximate 3.4 cm area of restricted diffusion involving the anterior left insula and overlying operculum, consistent with acute left MCA distribution infarct. No significant mass effect or associated hemorrhage. No other evidence for acute or subacute ischemia. No other acute intracranial hemorrhage. No significant chronic intracranial blood products. No mass lesion, midline shift or mass effect. No hydrocephalus or extra-axial fluid collection. Pituitary gland and suprasellar region within normal limits. Vascular: Major intracranial vascular flow voids are maintained. Skull and upper cervical spine: Craniocervical junction within normal limits. Bone marrow signal intensity normal. No scalp soft tissue abnormality. Sinuses/Orbits: Prior bilateral ocular  lens replacement. Paranasal sinuses are clear. Large right mastoid effusion noted. Additional trace left mastoid effusion. Visualized nasopharynx unremarkable. Other: None. MRA HEAD FINDINGS Anterior circulation: Examination mildly degraded by motion artifact. Both internal carotid arteries are patent through the siphons without significant stenosis or other abnormality. A1 segments, anterior communicating artery complex common anterior cerebral arteries patent without stenosis. No M1 stenosis or occlusion. Right MCA branches patent and well perfused. On the left, there is a probable acute left M3 occlusion (series 5, image 177), corresponding with the evolving left MCA distribution infarct. Remainder of the left MCA branches remain patent and perfused. Posterior circulation: Both V4 segments patent without stenosis. Both PICA patent at their origins. Basilar widely patent without stenosis. Superior cerebellar arteries patent bilaterally. Both PCAs primarily  supplied via the basilar. Left PCA patent to its distal aspect without significant stenosis. Focal severe distal right P2 stenosis (series 1032, image 13). Right PCA otherwise patent as well. Anatomic variants: None significant.  No intracranial aneurysm. IMPRESSION: MRI HEAD: 1. 3.4 cm acute involving the anterior left insula and overlying operculum, left MCA distribution. No associated hemorrhage or significant mass effect. 2. Underlying age-related cerebral volume loss with chronic microvascular ischemic disease, with multiple remote infarcts as above. MRA HEAD: 1. Probable acute left M3 occlusion, corresponding with the evolving left MCA distribution infarct. 2. Focal severe distal right P2 stenosis. 3. Otherwise wide patency of the major intracranial arterial vasculature. Electronically Signed   By: Rise Mu M.D.   On: 07/30/2023 18:44   MR ANGIO HEAD WO CONTRAST  Result Date: 07/30/2023 CLINICAL DATA:  Initial evaluation for mental status change, unknown cause. EXAM: MRI HEAD WITHOUT CONTRAST MRA HEAD WITHOUT CONTRAST TECHNIQUE: Multiplanar, multi-echo pulse sequences of the brain and surrounding structures were acquired without intravenous contrast. Angiographic images of the Circle of Willis were acquired using MRA technique without intravenous contrast. COMPARISON:  Prior study from 12/06/2021. FINDINGS: MRI HEAD FINDINGS Brain: Age-related cerebral volume loss. Patchy T2/FLAIR hyperintensity involving the periventricular deep white matter both cerebral hemispheres as well as the pons, consistent with chronic small vessel ischemic disease, mild in nature. Encephalomalacia and gliosis involving the right frontal operculum, consistent with a chronic right MCA distribution infarct. Additional small remote left PCA territory infarct involving the left occipital lobe noted. Few scattered remote cerebellar infarcts noted, left greater than right. Few scattered remote lacunar infarcts present  about the deep gray nuclei bilaterally. Approximate 3.4 cm area of restricted diffusion involving the anterior left insula and overlying operculum, consistent with acute left MCA distribution infarct. No significant mass effect or associated hemorrhage. No other evidence for acute or subacute ischemia. No other acute intracranial hemorrhage. No significant chronic intracranial blood products. No mass lesion, midline shift or mass effect. No hydrocephalus or extra-axial fluid collection. Pituitary gland and suprasellar region within normal limits. Vascular: Major intracranial vascular flow voids are maintained. Skull and upper cervical spine: Craniocervical junction within normal limits. Bone marrow signal intensity normal. No scalp soft tissue abnormality. Sinuses/Orbits: Prior bilateral ocular lens replacement. Paranasal sinuses are clear. Large right mastoid effusion noted. Additional trace left mastoid effusion. Visualized nasopharynx unremarkable. Other: None. MRA HEAD FINDINGS Anterior circulation: Examination mildly degraded by motion artifact. Both internal carotid arteries are patent through the siphons without significant stenosis or other abnormality. A1 segments, anterior communicating artery complex common anterior cerebral arteries patent without stenosis. No M1 stenosis or occlusion. Right MCA branches patent and well perfused. On the left, there is a probable acute left M3  occlusion (series 5, image 177), corresponding with the evolving left MCA distribution infarct. Remainder of the left MCA branches remain patent and perfused. Posterior circulation: Both V4 segments patent without stenosis. Both PICA patent at their origins. Basilar widely patent without stenosis. Superior cerebellar arteries patent bilaterally. Both PCAs primarily supplied via the basilar. Left PCA patent to its distal aspect without significant stenosis. Focal severe distal right P2 stenosis (series 1032, image 13). Right PCA  otherwise patent as well. Anatomic variants: None significant.  No intracranial aneurysm. IMPRESSION: MRI HEAD: 1. 3.4 cm acute involving the anterior left insula and overlying operculum, left MCA distribution. No associated hemorrhage or significant mass effect. 2. Underlying age-related cerebral volume loss with chronic microvascular ischemic disease, with multiple remote infarcts as above. MRA HEAD: 1. Probable acute left M3 occlusion, corresponding with the evolving left MCA distribution infarct. 2. Focal severe distal right P2 stenosis. 3. Otherwise wide patency of the major intracranial arterial vasculature. Electronically Signed   By: Rise Mu M.D.   On: 07/30/2023 18:44    Procedures Procedures    Medications Ordered in ED Medications  azithromycin (ZITHROMAX) 500 mg in sodium chloride 0.9 % 250 mL IVPB (500 mg Intravenous New Bag/Given 07/30/23 1952)  cefTRIAXone (ROCEPHIN) 2 g in sodium chloride 0.9 % 100 mL IVPB (0 g Intravenous Stopped 07/30/23 1953)  sodium chloride 0.9 % bolus 1,000 mL (1,000 mLs Intravenous New Bag/Given 07/30/23 1920)  iohexol (OMNIPAQUE) 300 MG/ML solution 75 mL (75 mLs Intravenous Contrast Given 07/30/23 2005)    ED Course/ Medical Decision Making/ A&P   CRITICAL CARE Performed by: Bethann Berkshire Total critical care time: 44 minutes Critical care time was exclusive of separately billable procedures and treating other patients. Critical care was necessary to treat or prevent imminent or life-threatening deterioration. Critical care was time spent personally by me on the following activities: development of treatment plan with patient and/or surrogate as well as nursing, discussions with consultants, evaluation of patient's response to treatment, examination of patient, obtaining history from patient or surrogate, ordering and performing treatments and interventions, ordering and review of laboratory studies, ordering and review of radiographic  studies, pulse oximetry and re-evaluation of patient's condition.                                   Medical Decision Making Amount and/or Complexity of Data Reviewed Labs: ordered. Radiology: ordered.  Risk Prescription drug management. Decision regarding hospitalization.  This patient presents to the ED for concern of altered mental status, this involves an extensive number of treatment options, and is a complaint that carries with it a high risk of complications and morbidity.  The differential diagnosis includes stroke, sepsis   Co morbidities that complicate the patient evaluation  Diabetes and dementia   Additional history obtained:  Additional history obtained from family External records from outside source obtained and reviewed including hospital records   Lab Tests:  I Ordered, and personally interpreted labs.  The pertinent results include: Lactic 2.5   Imaging Studies ordered:  I ordered imaging studies including MRI of head and CT of chest I independently visualized and interpreted imaging which showed MRI shows acute stroke and CT shows pleural effusion I agree with the radiologist interpretation   Cardiac Monitoring: / EKG:  The patient was maintained on a cardiac monitor.  I personally viewed and interpreted the cardiac monitored which showed an underlying rhythm of: Normal sinus  rhythm   Consultations Obtained:  I requested consultation with the neurologist and hospitalist,  and discussed lab and imaging findings as well as pertinent plan - they recommend: Admit to hospitalist with neurology consult   Problem List / ED Course / Critical interventions / Medication management  Dementia, stroke, pleural effusion I ordered medication including biotics for pneumonia Reevaluation of the patient after these medicines showed that the patient stayed the same I have reviewed the patients home medicines and have made adjustments as needed   Social  Determinants of Health:  None   Test / Admission - Considered:  None  Patient with acute stroke.  She is admitted to medicine with neurology consult        Final Clinical Impression(s) / ED Diagnoses Final diagnoses:  Acute ischemic stroke [facility])    Rx / DC Orders ED Discharge Orders     None         Bethann Berkshire, MD 07/31/23 1259

## 2023-07-31 ENCOUNTER — Observation Stay (HOSPITAL_COMMUNITY): Payer: Medicare Other

## 2023-07-31 ENCOUNTER — Encounter (HOSPITAL_COMMUNITY): Payer: Self-pay | Admitting: Family Medicine

## 2023-07-31 ENCOUNTER — Telehealth (HOSPITAL_COMMUNITY): Payer: Self-pay | Admitting: Pharmacy Technician

## 2023-07-31 ENCOUNTER — Other Ambulatory Visit (HOSPITAL_COMMUNITY): Payer: Self-pay

## 2023-07-31 DIAGNOSIS — I6389 Other cerebral infarction: Secondary | ICD-10-CM

## 2023-07-31 DIAGNOSIS — Z833 Family history of diabetes mellitus: Secondary | ICD-10-CM | POA: Diagnosis not present

## 2023-07-31 DIAGNOSIS — E1159 Type 2 diabetes mellitus with other circulatory complications: Secondary | ICD-10-CM | POA: Diagnosis not present

## 2023-07-31 DIAGNOSIS — E1122 Type 2 diabetes mellitus with diabetic chronic kidney disease: Secondary | ICD-10-CM | POA: Diagnosis present

## 2023-07-31 DIAGNOSIS — I480 Paroxysmal atrial fibrillation: Secondary | ICD-10-CM | POA: Diagnosis not present

## 2023-07-31 DIAGNOSIS — I152 Hypertension secondary to endocrine disorders: Secondary | ICD-10-CM | POA: Diagnosis not present

## 2023-07-31 DIAGNOSIS — Z7901 Long term (current) use of anticoagulants: Secondary | ICD-10-CM | POA: Diagnosis not present

## 2023-07-31 DIAGNOSIS — I10 Essential (primary) hypertension: Secondary | ICD-10-CM | POA: Diagnosis not present

## 2023-07-31 DIAGNOSIS — F419 Anxiety disorder, unspecified: Secondary | ICD-10-CM | POA: Diagnosis present

## 2023-07-31 DIAGNOSIS — K7581 Nonalcoholic steatohepatitis (NASH): Secondary | ICD-10-CM | POA: Diagnosis not present

## 2023-07-31 DIAGNOSIS — Z48813 Encounter for surgical aftercare following surgery on the respiratory system: Secondary | ICD-10-CM | POA: Diagnosis not present

## 2023-07-31 DIAGNOSIS — R278 Other lack of coordination: Secondary | ICD-10-CM | POA: Diagnosis not present

## 2023-07-31 DIAGNOSIS — E7800 Pure hypercholesterolemia, unspecified: Secondary | ICD-10-CM | POA: Diagnosis present

## 2023-07-31 DIAGNOSIS — K760 Fatty (change of) liver, not elsewhere classified: Secondary | ICD-10-CM | POA: Diagnosis present

## 2023-07-31 DIAGNOSIS — I63412 Cerebral infarction due to embolism of left middle cerebral artery: Secondary | ICD-10-CM | POA: Diagnosis not present

## 2023-07-31 DIAGNOSIS — I352 Nonrheumatic aortic (valve) stenosis with insufficiency: Secondary | ICD-10-CM | POA: Diagnosis not present

## 2023-07-31 DIAGNOSIS — M6281 Muscle weakness (generalized): Secondary | ICD-10-CM | POA: Diagnosis not present

## 2023-07-31 DIAGNOSIS — N1831 Chronic kidney disease, stage 3a: Secondary | ICD-10-CM | POA: Diagnosis not present

## 2023-07-31 DIAGNOSIS — K7460 Unspecified cirrhosis of liver: Secondary | ICD-10-CM | POA: Diagnosis not present

## 2023-07-31 DIAGNOSIS — J948 Other specified pleural conditions: Secondary | ICD-10-CM | POA: Diagnosis not present

## 2023-07-31 DIAGNOSIS — R262 Difficulty in walking, not elsewhere classified: Secondary | ICD-10-CM | POA: Diagnosis not present

## 2023-07-31 DIAGNOSIS — J90 Pleural effusion, not elsewhere classified: Secondary | ICD-10-CM | POA: Diagnosis not present

## 2023-07-31 DIAGNOSIS — K219 Gastro-esophageal reflux disease without esophagitis: Secondary | ICD-10-CM | POA: Diagnosis present

## 2023-07-31 DIAGNOSIS — F0390 Unspecified dementia without behavioral disturbance: Secondary | ICD-10-CM | POA: Diagnosis present

## 2023-07-31 DIAGNOSIS — I8500 Esophageal varices without bleeding: Secondary | ICD-10-CM | POA: Diagnosis not present

## 2023-07-31 DIAGNOSIS — Z7982 Long term (current) use of aspirin: Secondary | ICD-10-CM | POA: Diagnosis not present

## 2023-07-31 DIAGNOSIS — D649 Anemia, unspecified: Secondary | ICD-10-CM | POA: Diagnosis not present

## 2023-07-31 DIAGNOSIS — I4819 Other persistent atrial fibrillation: Secondary | ICD-10-CM | POA: Diagnosis not present

## 2023-07-31 DIAGNOSIS — E876 Hypokalemia: Secondary | ICD-10-CM | POA: Diagnosis not present

## 2023-07-31 DIAGNOSIS — Z90710 Acquired absence of both cervix and uterus: Secondary | ICD-10-CM | POA: Diagnosis not present

## 2023-07-31 DIAGNOSIS — Z79899 Other long term (current) drug therapy: Secondary | ICD-10-CM | POA: Diagnosis not present

## 2023-07-31 DIAGNOSIS — J918 Pleural effusion in other conditions classified elsewhere: Secondary | ICD-10-CM | POA: Diagnosis present

## 2023-07-31 DIAGNOSIS — Z87891 Personal history of nicotine dependence: Secondary | ICD-10-CM | POA: Diagnosis not present

## 2023-07-31 DIAGNOSIS — I130 Hypertensive heart and chronic kidney disease with heart failure and stage 1 through stage 4 chronic kidney disease, or unspecified chronic kidney disease: Secondary | ICD-10-CM | POA: Diagnosis not present

## 2023-07-31 DIAGNOSIS — R488 Other symbolic dysfunctions: Secondary | ICD-10-CM | POA: Diagnosis not present

## 2023-07-31 DIAGNOSIS — E119 Type 2 diabetes mellitus without complications: Secondary | ICD-10-CM | POA: Diagnosis not present

## 2023-07-31 DIAGNOSIS — R4701 Aphasia: Secondary | ICD-10-CM | POA: Diagnosis not present

## 2023-07-31 DIAGNOSIS — I6523 Occlusion and stenosis of bilateral carotid arteries: Secondary | ICD-10-CM | POA: Diagnosis not present

## 2023-07-31 DIAGNOSIS — E785 Hyperlipidemia, unspecified: Secondary | ICD-10-CM | POA: Diagnosis not present

## 2023-07-31 DIAGNOSIS — Z8249 Family history of ischemic heart disease and other diseases of the circulatory system: Secondary | ICD-10-CM | POA: Diagnosis not present

## 2023-07-31 DIAGNOSIS — C220 Liver cell carcinoma: Secondary | ICD-10-CM | POA: Diagnosis not present

## 2023-07-31 DIAGNOSIS — I517 Cardiomegaly: Secondary | ICD-10-CM | POA: Diagnosis not present

## 2023-07-31 DIAGNOSIS — R1312 Dysphagia, oropharyngeal phase: Secondary | ICD-10-CM | POA: Diagnosis not present

## 2023-07-31 DIAGNOSIS — I2510 Atherosclerotic heart disease of native coronary artery without angina pectoris: Secondary | ICD-10-CM | POA: Diagnosis not present

## 2023-07-31 DIAGNOSIS — I5032 Chronic diastolic (congestive) heart failure: Secondary | ICD-10-CM | POA: Diagnosis not present

## 2023-07-31 DIAGNOSIS — I69398 Other sequelae of cerebral infarction: Secondary | ICD-10-CM | POA: Diagnosis not present

## 2023-07-31 DIAGNOSIS — I639 Cerebral infarction, unspecified: Secondary | ICD-10-CM | POA: Diagnosis not present

## 2023-07-31 LAB — ECHOCARDIOGRAM COMPLETE
AR max vel: 0.73 cm2
AV Area VTI: 0.77 cm2
AV Area mean vel: 0.72 cm2
AV Mean grad: 25 mm[Hg]
AV Peak grad: 45.4 mm[Hg]
Ao pk vel: 3.37 m/s
Est EF: >75
Height: 62 in
MV VTI: 1.05 cm2
P 1/2 time: 341 ms
S' Lateral: 2 cm
Weight: 2144.63 [oz_av]

## 2023-07-31 LAB — LIPID PANEL
Cholesterol: 119 mg/dL (ref 0–200)
HDL: 32 mg/dL — ABNORMAL LOW (ref >40)
LDL Cholesterol: 77 mg/dL (ref 0–99)
Total CHOL/HDL Ratio: 3.7 {ratio}
Triglycerides: 50 mg/dL (ref <150)
VLDL: 10 mg/dL (ref 0–40)

## 2023-07-31 LAB — GRAM STAIN: Gram Stain: NONE SEEN

## 2023-07-31 LAB — HEMOGLOBIN A1C
Hgb A1c MFr Bld: 5.3 % (ref 4.8–5.6)
Mean Plasma Glucose: 105.41 mg/dL

## 2023-07-31 LAB — COMPREHENSIVE METABOLIC PANEL
ALT: 17 U/L (ref 0–44)
AST: 27 U/L (ref 15–41)
Albumin: 2.2 g/dL — ABNORMAL LOW (ref 3.5–5.0)
Alkaline Phosphatase: 86 U/L (ref 38–126)
Anion gap: 7 (ref 5–15)
BUN: 12 mg/dL (ref 8–23)
CO2: 24 mmol/L (ref 22–32)
Calcium: 8.1 mg/dL — ABNORMAL LOW (ref 8.9–10.3)
Chloride: 108 mmol/L (ref 98–111)
Creatinine, Ser: 0.82 mg/dL (ref 0.44–1.00)
GFR, Estimated: >60 mL/min (ref >60)
Glucose, Bld: 99 mg/dL (ref 70–99)
Potassium: 2.6 mmol/L — CL (ref 3.5–5.1)
Sodium: 139 mmol/L (ref 135–145)
Total Bilirubin: 2.2 mg/dL — ABNORMAL HIGH (ref <1.2)
Total Protein: 4.7 g/dL — ABNORMAL LOW (ref 6.5–8.1)

## 2023-07-31 LAB — CBC
HCT: 32 % — ABNORMAL LOW (ref 36.0–46.0)
Hemoglobin: 10.8 g/dL — ABNORMAL LOW (ref 12.0–15.0)
MCH: 31.7 pg (ref 26.0–34.0)
MCHC: 33.8 g/dL (ref 30.0–36.0)
MCV: 93.8 fL (ref 80.0–100.0)
Platelets: 100 10*3/uL — ABNORMAL LOW (ref 150–400)
RBC: 3.41 MIL/uL — ABNORMAL LOW (ref 3.87–5.11)
RDW: 14.6 % (ref 11.5–15.5)
WBC: 4.8 10*3/uL (ref 4.0–10.5)
nRBC: 0 % (ref 0.0–0.2)

## 2023-07-31 LAB — BODY FLUID CELL COUNT WITH DIFFERENTIAL
Eos, Fluid: 0 %
Lymphs, Fluid: 56 %
Monocyte-Macrophage-Serous Fluid: 30 % — ABNORMAL LOW (ref 50–90)
Neutrophil Count, Fluid: 14 % (ref 0–25)
Total Nucleated Cell Count, Fluid: 129 uL (ref 0–1000)

## 2023-07-31 LAB — LACTATE DEHYDROGENASE, PLEURAL OR PERITONEAL FLUID: LD, Fluid: 49 U/L — ABNORMAL HIGH (ref 3–23)

## 2023-07-31 LAB — PROTEIN, PLEURAL OR PERITONEAL FLUID: Total protein, fluid: <3 g/dL

## 2023-07-31 LAB — MAGNESIUM: Magnesium: 1.7 mg/dL (ref 1.7–2.4)

## 2023-07-31 LAB — GLUCOSE, PLEURAL OR PERITONEAL FLUID: Glucose, Fluid: 122 mg/dL

## 2023-07-31 MED ORDER — LIDOCAINE HCL (PF) 2 % IJ SOLN
10.0000 mL | Freq: Once | INTRAMUSCULAR | Status: AC
  Administered 2023-07-31: 10 mL

## 2023-07-31 MED ORDER — DABIGATRAN ETEXILATE MESYLATE 150 MG PO CAPS
150.0000 mg | ORAL_CAPSULE | Freq: Two times a day (BID) | ORAL | Status: DC
  Administered 2023-08-01: 150 mg via ORAL
  Filled 2023-07-31: qty 1

## 2023-07-31 MED ORDER — LACTULOSE 10 GM/15ML PO SOLN
30.0000 g | Freq: Once | ORAL | Status: AC
  Filled 2023-07-31: qty 60

## 2023-07-31 MED ORDER — MAGNESIUM SULFATE 2 GM/50ML IV SOLN
2.0000 g | Freq: Once | INTRAVENOUS | Status: AC
  Administered 2023-07-31: 2 g via INTRAVENOUS
  Filled 2023-07-31: qty 50

## 2023-07-31 MED ORDER — LIDOCAINE HCL (PF) 2 % IJ SOLN
INTRAMUSCULAR | Status: AC
  Filled 2023-07-31: qty 10

## 2023-07-31 MED ORDER — POTASSIUM CHLORIDE 10 MEQ/100ML IV SOLN
10.0000 meq | INTRAVENOUS | Status: AC
  Administered 2023-07-31 (×5): 10 meq via INTRAVENOUS
  Filled 2023-07-31 (×4): qty 100

## 2023-07-31 NOTE — Telephone Encounter (Signed)
Patient Product/process development scientist completed.    The patient is insured through [facility]. Patient has Medicare and is not eligible for a copay card, but may be able to apply for patient assistance, if available.    Ran test claim for dabigatran (Pradaxa) 150 mg  and Product Not Covered.  Eliquis and Xarelto is preferred   This test claim was processed through [facility]- copay amounts may vary at other pharmacies due to [employer], or as the patient moves through the different stages of their insurance plan.     Roland Earl, CPHT Pharmacy Technician III Certified Patient Advocate [facility] Patient Advocate Team Direct Number: [phone]  Fax: [phone]

## 2023-07-31 NOTE — Hospital Course (Signed)
82 y.o. female with medical history significant for atrial fibrillation on Eliquis, CAD, aortic stenosis, cirrhosis, history of CVA, and hepatocellular carcinoma status post radioembolization who now presents with change in mental status.    Patient reportedly went to bed in her usual state last night at approximately 11 PM but was noted upon waking today to be moving very slowly.  She would make eye contact, but was not responding verbally to family.  This lasted approximately 2 hours before she began speaking again.  She has not been complaining of anything.  She has not appeared to be short of breath.   ED Course: Upon arrival to the ED, patient is found to be afebrile and saturating well on room air with normal heart rate and stable blood pressure.  Labs are most notable for potassium 3.1, creatinine 1.09, albumin 2.9, bilirubin 3.8, and lactic acid 2.5.  MRI brain reveals acute ischemic infarction in left MCA distribution.  Chest x-ray notable for bilateral pleural effusions.  CT chest was ordered and performed but not yet read.   Neurology (Dr. Amada Jupiter) was consulted by the ED physician and recommended holding Eliquis, continuing aspirin, and admitting the patient to [facility] for stroke workup.  She was given 1 L NS, Rocephin, and azithromycin in the ED.

## 2023-07-31 NOTE — Evaluation (Signed)
Speech Language Pathology Evaluation Patient Details Name: [name] MRN: [id number] DOB: 03-31-1941 Today's Date: 07/31/2023 Time: 2841-3244 SLP Time Calculation (min) (ACUTE ONLY): 16 min  Problem List:  Patient Active Problem List   Diagnosis Date Noted   Acute ischemic stroke (HCC) 07/30/2023   CKD stage 3a, GFR 45-59 ml/min (HCC) 07/30/2023   Pleural effusion 07/30/2023   Encephalopathy, hepatic (HCC) 11/03/2022   Cancer, hepatocellular (HCC) 08/21/2022   Gastroesophageal reflux disease with esophagitis without hemorrhage 07/01/2022   Abnormal CT of liver 07/01/2022   Community acquired pneumonia    Chronic diastolic HF (heart failure) (HCC)    Sepsis secondary to UTI (HCC) 04/12/2022   CVA (cerebral vascular accident) (HCC) 12/06/2021   NASH (nonalcoholic steatohepatitis) 06/05/2020   History of esophageal varices 06/05/2020   Persistent atrial fibrillation (HCC) 03/09/2020   Paroxysmal atrial fibrillation (HCC) 02/24/2020   Secondary hypercoagulable state (HCC) 02/24/2020   Acute CVA (cerebrovascular accident) (HCC) 01/03/2020   Tobacco use disorder 07/09/2018   Small vessel disease (HCC) 07/09/2018   Benign essential HTN    Seizures (HCC)    Dysphagia    Dysarthria 05/19/2018   Syncope and collapse    Closed left femoral fracture (HCC) 04/21/2017   Seizure disorder (HCC) 04/21/2017   Closed pertrochanteric fracture of femur, left, initial encounter (HCC) 03/02/2017   Seizure (HCC) 02/28/2017   Hip fracture (HCC) 02/28/2017   Diabetes mellitus without complication (HCC) 01/20/2017   Epiretinal membrane (ERM) of left eye 01/20/2017   Pseudophakia 01/20/2017   NSTEMI (non-ST elevated myocardial infarction) (HCC) 11/02/2016   Elevated troponin 09/24/2016   Hypokalemia 09/23/2016   TIA (transient ischemic attack) 05/05/2016   Nausea and vomiting 10/15/2015   Acute coronary syndrome (HCC) 06/01/2015   Thrombocytopenia (HCC) 06/01/2015   Cerebral infarction  (HCC) right occipital due to small vessel disease s/p tPA 03/06/2014   PSVT (paroxysmal supraventricular tachycardia) (HCC) 12/09/2013   Esophageal varices without bleeding (HCC) 03/29/2013   Hematemesis 03/29/2013   Cirrhosis of liver without ascites (HCC) 03/29/2013   Presence of stent in right coronary artery 07/04/2011   OSA (obstructive sleep apnea) 07/04/2011   Aortic sclerosis 07/04/2011   Aortic insufficiency 07/04/2011   HTN (hypertension) 07/04/2011   Carotid stenosis, bilateral 07/04/2011   CAD (coronary artery disease) 07/03/2011   Hypercholesteremia 07/03/2011   GERD (gastroesophageal reflux disease) 07/03/2011   Past Medical History:  Past Medical History:  Diagnosis Date   Anemia    Anxiety    Aortic insufficiency    Moderate   Atrial fibrillation (HCC)    Back pain    Carotid stenosis, bilateral    Cirrhosis (HCC)    Closed left femoral fracture (HCC) 04/21/2017   Coronary artery disease    Stent x 2 RCA 1995, cardiac catheterization 09/2016 showing only mild atherosclerosis   DDD (degenerative disc disease), lumbar    Depression    Diabetes mellitus without complication (HCC)    Essential hypertension    Falls    GERD (gastroesophageal reflux disease)    Hiatal hernia    History of stroke    Hypercholesteremia    IBS (irritable bowel syndrome)    Non-alcoholic fatty liver disease    OSA (obstructive sleep apnea)    no CPAP   Pericardial effusion    a. small by echo 2018.   PSVT (paroxysmal supraventricular tachycardia) (HCC)    Recurrent UTI    Seizures (HCC) 2018   Stroke (HCC) 07/2019   several strokes within the last  10 years   Swallowing difficulty    Varices, esophageal (HCC)    Past Surgical History:  Past Surgical History:  Procedure Laterality Date   ANAL FISSURE REPAIR  1972   APPENDECTOMY     BACK SURGERY  1985   CARDIAC CATHETERIZATION  [phone]   Stent to the proximal RCA after MI    CARDIAC CATHETERIZATION N/A 09/26/2016    Procedure: Left Heart Cath and Coronary Angiography;  Surgeon: Marykay Lex, MD;  Location: [facility] INVASIVE CV LAB;  Service: Cardiovascular;  Laterality: N/A;   CATARACT EXTRACTION W/PHACO Right 07/04/2014   Procedure: CATARACT EXTRACTION PHACO AND INTRAOCULAR LENS PLACEMENT (IOC);  Surgeon: Loraine Leriche T. Nile Riggs, MD;  Location: [facility];  Service: Ophthalmology;  Laterality: Right;  CDE:13.13   COLONOSCOPY     DILATION AND CURETTAGE OF UTERUS     x2   Epi Retinal Membrane Peel Left    ERCP     ESOPHAGEAL BANDING N/A 04/01/2013   Procedure: ESOPHAGEAL BANDING;  Surgeon: Malissa Hippo, MD;  Location: [facility];  Service: Endoscopy;  Laterality: N/A;   ESOPHAGEAL BANDING N/A 05/24/2013   Procedure: ESOPHAGEAL BANDING;  Surgeon: Malissa Hippo, MD;  Location: [facility];  Service: Endoscopy;  Laterality: N/A;   ESOPHAGEAL BANDING N/A 06/21/2014   Procedure: ESOPHAGEAL BANDING;  Surgeon: Malissa Hippo, MD;  Location: [facility];  Service: Endoscopy;  Laterality: N/A;   ESOPHAGEAL BANDING  06/15/2020   Procedure: ESOPHAGEAL BANDING;  Surgeon: Dolores Frame, MD;  Location: [facility];  Service: Gastroenterology;;   ESOPHAGOGASTRODUODENOSCOPY N/A 04/01/2013   Procedure: ESOPHAGOGASTRODUODENOSCOPY (EGD);  Surgeon: Malissa Hippo, MD;  Location: [facility];  Service: Endoscopy;  Laterality: N/A;  230-rescheduled to 8:30am Ann notified pt   ESOPHAGOGASTRODUODENOSCOPY N/A 05/24/2013   Procedure: ESOPHAGOGASTRODUODENOSCOPY (EGD);  Surgeon: Malissa Hippo, MD;  [facility]: [facility];  Service: Endoscopy;  Laterality: N/A;  730   ESOPHAGOGASTRODUODENOSCOPY N/A 06/21/2014   Procedure: ESOPHAGOGASTRODUODENOSCOPY (EGD);  Surgeon: Malissa Hippo, MD;  [facility]: [facility];  Service: Endoscopy;  Laterality: N/A;  930-rescheduled 10/14 @ 1200 Ann to notify pt   ESOPHAGOGASTRODUODENOSCOPY (EGD) WITH PROPOFOL N/A 06/15/2020   Procedure: ESOPHAGOGASTRODUODENOSCOPY (EGD) WITH PROPOFOL;   Surgeon: Dolores Frame, MD;  [facility]: [facility];  Service: Gastroenterology;  Laterality: N/A;  230   ESOPHAGOGASTRODUODENOSCOPY (EGD) WITH PROPOFOL N/A 07/27/2020   Procedure: ESOPHAGOGASTRODUODENOSCOPY (EGD) WITH PROPOFOL;  Surgeon: Dolores Frame, MD;  [facility]: [facility];  Service: Gastroenterology;  Laterality: N/A;  1245   ESOPHAGOGASTRODUODENOSCOPY (EGD) WITH PROPOFOL N/A 08/19/2022   Procedure: ESOPHAGOGASTRODUODENOSCOPY (EGD) WITH PROPOFOL;  Surgeon: Dolores Frame, MD;  Location: [facility];  Service: Gastroenterology;  Laterality: N/A;  130 ASA 3, pt knows to arrive at 6:15   EYE SURGERY  08   cataract surgery of the left eye   FEMUR IM NAIL Left 03/02/2017   Procedure: INTRAMEDULLARY (IM) NAIL FEMORAL;  Surgeon: Samson Frederic, MD;  Location: [facility] OR;  Service: Orthopedics;  Laterality: Left;   HARDWARE REMOVAL Right 01/17/2013   Procedure: REMOVAL OF HARDWARE AND EXCISION ULNAR STYLOID RIGHT WRIST;  Surgeon: Tami Ribas, MD;  Location: [facility];  Service: Orthopedics;  Laterality: Right;   implanted heart monitor  02/2020   to monitor Afib   IR 3D INDEPENDENT WKST  09/26/2022   IR ANGIOGRAM SELECTIVE EACH ADDITIONAL VESSEL  09/26/2022   IR ANGIOGRAM SELECTIVE EACH ADDITIONAL VESSEL  09/26/2022  IR ANGIOGRAM SELECTIVE EACH ADDITIONAL VESSEL  09/26/2022   IR ANGIOGRAM SELECTIVE EACH ADDITIONAL VESSEL  09/26/2022   IR ANGIOGRAM SELECTIVE EACH ADDITIONAL VESSEL  09/26/2022   IR ANGIOGRAM SELECTIVE EACH ADDITIONAL VESSEL  10/10/2022   IR ANGIOGRAM SELECTIVE EACH ADDITIONAL VESSEL  10/10/2022   IR ANGIOGRAM SELECTIVE EACH ADDITIONAL VESSEL  10/10/2022   IR ANGIOGRAM VISCERAL SELECTIVE  09/26/2022   IR ANGIOGRAM VISCERAL SELECTIVE  10/10/2022   IR EMBO TUMOR ORGAN ISCHEMIA INFARCT INC GUIDE ROADMAPPING  10/10/2022   IR INTRAVASCULAR ULTRASOUND NON CORONARY  09/26/2022   IR RADIOLOGIST EVAL & MGMT  09/10/2022   IR RADIOLOGIST EVAL & MGMT   11/03/2022   IR RADIOLOGIST EVAL & MGMT  02/10/2023   IR RADIOLOGIST EVAL & MGMT  05/15/2023   IR TRANSCATH PLC STENT 1ST ART NOT LE CV CAR VERT CAR  09/26/2022   IR US GUIDE VASC ACCESS RIGHT  09/26/2022   IR US GUIDE VASC ACCESS RIGHT  10/10/2022   MYRINGOTOMY  2012   both ears   ORIF FEMUR FRACTURE Left 04/23/2017   Procedure: OPEN REDUCTION INTERNAL FIXATION (ORIF) DISTAL FEMUR FRACTURE (FRACTURE AROUND FEMORAL NAIL);  Surgeon: Myrene Galas, MD;  [facility];  Service: Orthopedics;  Laterality: Left;   TONSILLECTOMY     TUBAL LIGATION     VAGINAL HYSTERECTOMY  1972   WRIST SURGERY     rt wrist hardwear removal   HPI:  82 y.o. female with medical history significant for atrial fibrillation on Eliquis, CAD, aortic stenosis, cirrhosis, history of CVA, and hepatocellular carcinoma status post radioembolization who now presents with change in mental status. Most recent speech and language evaluation completed in 2019 with mild/moderate cognitive impairment achieving a score of 15/30 on the MoCA at that time. Granddaughter reports some decline since this time.   Assessment / Plan / Recommendation Clinical Impression  Speech Language evaluation completed by means of the [facility] SLUMS Evaluation. Pt achieved a score of 4/30 indicating severe cognitive impairment. Spoke with Pt's granddaughter who lives with Pt who reports baseline cognitive impairment however, she indicates that this today's presentation is significantly worse than normal. Needs in most cognitive areas however, of note Pt was unable to generate any words for the generational naming task, recall any of the 5 words with a brief delay and she has poor awareness of her abilities. All of these impairments negatively impact her independence and safety. Pt also demonstrated mild diffiuclty with naming objects, however when given enough time and min cues was able to generate most words. Pt will benefit from a more in depth speech and language  evaluation at d/c location and ST intervention for cognition and speech. Thank you for this referral,  [facility] SLUMS Examination Orientation  2/3  Numeric Problem Solving  0/3  Memory  0/5  Attention 0/2  Thought Organization 0/3  Clock Drawing 0/4  Visuospatial Skills               0/2  Short Story Recall  2/8  Total  4/30     Scoring  High School Education  Less than High School Education   Normal  27-30 25-30  Mild Neurocognitive Disorder 21-26 20-24  Dementia  1-20 1-19       SLP Assessment  SLP Recommendation/Assessment: All further Speech Lanaguage Pathology  needs can be addressed in the next venue of care SLP Visit Diagnosis: Dysphagia, pharyngeal phase (R13.13)    Recommendations for follow up therapy are one component of  a multi-disciplinary discharge planning process, led by the attending physician.  Recommendations may be updated based on patient status, additional functional criteria and insurance authorization.    Follow Up Recommendations  Skilled nursing-short term rehab (<3 hours/day)    Assistance Recommended at Discharge     Functional Status Assessment Patient has had a recent decline in their functional status and demonstrates the ability to make significant improvements in function in a reasonable and predictable amount of time.  Frequency and Duration           SLP Evaluation Cognition  Overall Cognitive Status: Impaired/Different from baseline (Spoke with granddaughter, [place] on the phone) Arousal/Alertness: Awake/alert Orientation Level: Oriented to person;Disoriented to place;Disoriented to situation;Disoriented to time Year:  (unable) Day of Week: Correct Attention: Focused Focused Attention: Impaired Focused Attention Impairment: Verbal basic Memory: Impaired Memory Impairment: Decreased short term memory Decreased Short Term Memory: Verbal basic Awareness: Impaired Awareness Impairment: Intellectual impairment Problem Solving:  Impaired Problem Solving Impairment: Verbal basic Executive Function: Organizing;Decision Making;Reasoning Reasoning: Impaired Reasoning Impairment: Verbal basic Organizing: Impaired Organizing Impairment: Verbal basic Decision Making: Impaired Decision Making Impairment: Verbal basic Safety/Judgment: Impaired       Comprehension  Auditory Comprehension Overall Auditory Comprehension: Appears within functional limits for tasks assessed Yes/No Questions: Within Functional Limits Commands: Within Functional Limits Conversation: Simple    Expression Verbal Expression Overall Verbal Expression: Impaired Naming: Impairment Responsive: 76-100% accurate Confrontation: Impaired Convergent: 75-100% accurate   Oral / Motor  Oral Motor/Sensory Function Overall Oral Motor/Sensory Function: Within functional limits Motor Speech Overall Motor Speech: Appears within functional limits for tasks assessed           Taela Charbonneau H. Romie Levee, CCC-SLP Speech Language Pathologist  Georgetta Haber 07/31/2023, 11:37 AM

## 2023-07-31 NOTE — Evaluation (Signed)
Occupational Therapy Evaluation Patient Details Name: [name] MRN: [id number] DOB: [birth year] Today's Date: 07/31/2023   History of Present Illness Paula Gassler Lakatos is a 82 y.o. female with medical history significant for atrial fibrillation on Eliquis, CAD, aortic stenosis, cirrhosis, history of CVA, and hepatocellular carcinoma status post radioembolization who now presents with change in mental status.      Patient reportedly went to bed in her usual state last night at approximately 11 PM but was noted upon waking today to be moving very slowly.  She would make eye contact, but was not responding verbally to family.  This lasted approximately 2 hours before she began speaking again.  She has not been complaining of anything.  She has not appeared to be short of breath.   Clinical Impression   Pt agreeable to OT and PT co-evaluation. Pt not oriented to anything but self today. Pt report of prior living function was different from chart review information. Pt required CGA for bed mobility and min A for transfer to the chair with RW. Able to complete seated ADL's. Mild gross and fine motor difficulty noted in B UE. Verbal and tactile cuing to follow commands. No family present. Pt left in the chair with chair alarm set and call bell within reach. Pt will benefit from continued OT in the hospital and recommended venue below to increase strength, balance, and endurance for safe ADL's.        If plan is discharge home, recommend the following: A little help with walking and/or transfers;A little help with bathing/dressing/bathroom;Assistance with cooking/housework;Direct supervision/assist for medications management;Assist for transportation;Help with stairs or ramp for entrance    Functional Status Assessment  Patient has had a recent decline in their functional status and demonstrates the ability to make significant improvements in function in a reasonable and predictable amount of time.   Equipment Recommendations  None recommended by OT    Recommendations for Other Services       Precautions / Restrictions Precautions Precautions: Fall Restrictions Weight Bearing Restrictions: No      Mobility Bed Mobility Overal bed mobility: Needs Assistance Bed Mobility: Supine to Sit     Supine to sit: Contact guard, Supervision          Transfers Overall transfer level: Needs assistance Equipment used: Rolling walker (2 wheels) Transfers: Sit to/from Stand, Bed to chair/wheelchair/BSC Sit to Stand: Min assist     Step pivot transfers: Min assist     General transfer comment: Slow labored; extended time      Balance Overall balance assessment: Needs assistance Sitting-balance support: Bilateral upper extremity supported, Feet unsupported Sitting balance-Leahy Scale: Fair Sitting balance - Comments: seatd at EOB; mild lean posteriorly at times Postural control: Posterior lean Standing balance support: Bilateral upper extremity supported, During functional activity, Reliant on assistive device for balance Standing balance-Leahy Scale: Fair Standing balance comment: fair with RW                           ADL either performed or assessed with clinical judgement   ADL Overall ADL's : Needs assistance/impaired     Grooming: Set up;Sitting   Upper Body Bathing: Sitting;Contact guard assist;Supervision/ safety   Lower Body Bathing: Contact guard assist;Supervison/ safety;Sitting/lateral leans   Upper Body Dressing : Contact guard assist;Supervision/safety   Lower Body Dressing: Contact guard assist;Sitting/lateral leans Lower Body Dressing Details (indicate cue type and reason): Able to doff and don sock seated in  the chair. Toilet Transfer: Minimal assistance;Stand-pivot;Rolling walker (2 wheels) Toilet Transfer Details (indicate cue type and reason): Simulated via EOB to chair transfer Toileting- Clothing Manipulation and Hygiene: Contact  guard assist;Sitting/lateral lean       Functional mobility during ADLs: Minimal assistance;Rolling walker (2 wheels) General ADL Comments: Able to take ~3 steps to and from chair using RW. Slow labored movement.     Vision Baseline Vision/History: 1 Wears glasses Ability to See in Adequate Light: 0 Adequate Patient Visual Report: No change from baseline Vision Assessment?: No apparent visual deficits Additional Comments: per chart review, observation, and pt report.     Perception Perception: Not tested       Praxis Praxis: Impaired Praxis Impairment Details: Organization Praxis-Other Comments: Possibly mild coordination deficits.   Pertinent Vitals/Pain Pain Assessment Pain Assessment: No/denies pain     Extremity/Trunk Assessment Upper Extremity Assessment Upper Extremity Assessment: Generalized weakness (4/5 grossly. Possibly mild wrist flexion and extnesion A/ROM deficits. Difficult to assess given pt's cognitive status. Mild difficulty with fine and gross motor coordination. No sensation deficits.)   Lower Extremity Assessment Lower Extremity Assessment: Defer to PT evaluation   Cervical / Trunk Assessment Cervical / Trunk Assessment: Kyphotic   Communication Communication Communication: Other (comment) (Possible mild word finding difficulties. Unsure of pt's baseline communication.)   Cognition Arousal: Alert Behavior During Therapy: Impulsive Overall Cognitive Status: No family/caregiver present to determine baseline cognitive functioning                                 General Comments: patient answers do not match chart review                   Home Living Family/patient expects to be discharged to:: Private residence Living Arrangements: Other relatives Available Help at Discharge: Family;Available 24 hours/day Type of Home: House Home Access: Stairs to enter [employer] of Steps: 3 steps from car to yard, 2 steps from  yard to [employer]: Right Home Layout: One level     Bathroom Shower/Tub: Chief Strategy Officer: Standard Bathroom Accessibility: Yes   Home Equipment: Agricultural consultant (2 wheels);Wheelchair - Customer service manager - single point   Additional Comments: from chart review; patient an inaccurate historian      Prior Functioning/Environment Prior Level of Function : Needs assist       Physical Assist : Mobility (physical);ADLs (physical)     Mobility Comments: uses WC for mobility; able to take a few steps during transfers using RW, otherwise stand pivots for most transfers (per PT) ADLs Comments: assisted by family for chart review (per chart review; pt appears to be a poor historian at this time.)        OT Problem List: Decreased strength;Decreased activity tolerance;Impaired balance (sitting and/or standing);Decreased cognition      OT Treatment/Interventions: Self-care/ADL training;Therapeutic exercise;Therapeutic activities;Patient/family education;Balance training;Neuromuscular education    OT Goals(Current goals can be found in the care plan section) Acute Rehab OT Goals Patient Stated Goal: improve function OT Goal Formulation: With patient Time For Goal Achievement: 08/14/23 Potential to Achieve Goals: Good  OT Frequency: Min 2X/week    Co-evaluation PT/OT/SLP Co-Evaluation/Treatment: Yes Reason for Co-Treatment: Necessary to address cognition/behavior during functional activity;To address functional/ADL transfers PT goals addressed during session: Mobility/safety with mobility;Balance OT goals addressed during session: ADL's and self-care      AM-PAC OT "6 Clicks" Daily Activity  Outcome Measure Help from another person eating meals?: A Little Help from another person taking care of personal grooming?: A Little Help from another person toileting, which includes using toliet, bedpan, or urinal?: A Little Help from  another person bathing (including washing, rinsing, drying)?: A Little Help from another person to put on and taking off regular upper body clothing?: A Little Help from another person to put on and taking off regular lower body clothing?: A Little 6 Click Score: 18   End of Session Equipment Utilized During Treatment: Rolling walker (2 wheels);Gait belt  Activity Tolerance: Patient tolerated treatment well Patient left: in chair;with call bell/phone within reach;with chair alarm set  OT Visit Diagnosis: Unsteadiness on feet (R26.81);Other abnormalities of gait and mobility (R26.89);Muscle weakness (generalized) (M62.81);Other symptoms and signs involving cognitive function                Time: 8295-6213 OT Time Calculation (min): 24 min Charges:  OT General Charges $OT Visit: 1 Visit OT Evaluation $OT Eval Low Complexity: 1 Low  Loyal Holzheimer OT, MOT   Danie Chandler 07/31/2023, 11:12 AM

## 2023-07-31 NOTE — [place] FL2 (Signed)
[place] MEDICAID FL2 LEVEL OF CARE FORM     IDENTIFICATION  Patient Name: [name] Birthdate: 05-Feb-1941 Sex: female Admission Date (Current Location): 07/30/2023  [facility] and [place] Number:  [employer] and Address:  [facility],  [address] [place] [address]      Provider Number: [id number]  Attending Physician Name and Address:  Cleora Fleet, MD  Relative Name and Phone Number:       Current Level of Care: Hospital Recommended Level of Care: Skilled Nursing Facility Prior Approval Number:    Date Approved/Denied:   PASRR Number: [id number]  Discharge Plan: SNF    Current Diagnoses: Patient Active Problem List   Diagnosis Date Noted   Acute ischemic stroke (HCC) 07/30/2023   CKD stage 3a, GFR 45-59 ml/min (HCC) 07/30/2023   Pleural effusion 07/30/2023   Encephalopathy, hepatic (HCC) 11/03/2022   Cancer, hepatocellular (HCC) 08/21/2022   Gastroesophageal reflux disease with esophagitis without hemorrhage 07/01/2022   Abnormal CT of liver 07/01/2022   Community acquired pneumonia    Chronic diastolic HF (heart failure) (HCC)    Sepsis secondary to UTI (HCC) 04/12/2022   CVA (cerebral vascular accident) (HCC) 12/06/2021   NASH (nonalcoholic steatohepatitis) 06/05/2020   History of esophageal varices 06/05/2020   Persistent atrial fibrillation (HCC) 03/09/2020   Paroxysmal atrial fibrillation (HCC) 02/24/2020   Secondary hypercoagulable state (HCC) 02/24/2020   Acute CVA (cerebrovascular accident) (HCC) 01/03/2020   Tobacco use disorder 07/09/2018   Small vessel disease (HCC) 07/09/2018   Benign essential HTN    Seizures (HCC)    Dysphagia    Dysarthria 05/19/2018   Syncope and collapse    Closed left femoral fracture (HCC) 04/21/2017   Seizure disorder (HCC) 04/21/2017   Closed pertrochanteric fracture of femur, left, initial encounter (HCC) 03/02/2017   Seizure (HCC) 02/28/2017   Hip fracture (HCC) 02/28/2017    Diabetes mellitus without complication (HCC) 01/20/2017   Epiretinal membrane (ERM) of left eye 01/20/2017   Pseudophakia 01/20/2017   NSTEMI (non-ST elevated myocardial infarction) (HCC) 11/02/2016   Elevated troponin 09/24/2016   Hypokalemia 09/23/2016   TIA (transient ischemic attack) 05/05/2016   Nausea and vomiting 10/15/2015   Acute coronary syndrome (HCC) 06/01/2015   Thrombocytopenia (HCC) 06/01/2015   Cerebral infarction (HCC) right occipital due to small vessel disease s/p tPA 03/06/2014   PSVT (paroxysmal supraventricular tachycardia) (HCC) 12/09/2013   Esophageal varices without bleeding (HCC) 03/29/2013   Hematemesis 03/29/2013   Cirrhosis of liver without ascites (HCC) 03/29/2013   Presence of stent in right coronary artery 07/04/2011   OSA (obstructive sleep apnea) 07/04/2011   Aortic sclerosis 07/04/2011   Aortic insufficiency 07/04/2011   HTN (hypertension) 07/04/2011   Carotid stenosis, bilateral 07/04/2011   CAD (coronary artery disease) 07/03/2011   Hypercholesteremia 07/03/2011   GERD (gastroesophageal reflux disease) 07/03/2011    Orientation RESPIRATION BLADDER Height & Weight     Self, Place  Normal Incontinent Weight: 134 lb 0.6 oz (60.8 kg) Height:  5\' 2"  (157.5 cm)  BEHAVIORAL SYMPTOMS/MOOD NEUROLOGICAL BOWEL NUTRITION STATUS      Continent Diet (see dc summary)  AMBULATORY STATUS COMMUNICATION OF NEEDS Skin   Extensive Assist Verbally Normal                       Personal Care Assistance Level of Assistance  Bathing, Feeding, Dressing Bathing Assistance: Limited assistance Feeding assistance: Limited assistance Dressing Assistance: Limited assistance     Functional Limitations  Info  Sight, Hearing, Speech Sight Info: Adequate Hearing Info: Adequate Speech Info: Adequate    SPECIAL CARE FACTORS FREQUENCY  PT (By licensed PT), OT (By licensed OT), Speech therapy     PT Frequency: 5x week OT Frequency: 5x week     Speech  Therapy Frequency: 5x week      Contractures Contractures Info: Not present    Additional Factors Info  Code Status, Allergies Code Status Info: Full Allergies Info: Tape           Current Medications (07/31/2023):  This is the current hospital active medication list Current Facility-Administered Medications  Medication Dose Route Frequency Provider Last Rate Last Admin   acetaminophen (TYLENOL) tablet 650 mg  650 mg Oral Q4H PRN Opyd, Lavone Neri, MD       Or   acetaminophen (TYLENOL) 160 MG/5ML solution 650 mg  650 mg Per Tube Q4H PRN Opyd, Lavone Neri, MD       Or   acetaminophen (TYLENOL) suppository 650 mg  650 mg Rectal Q4H PRN Opyd, Lavone Neri, MD       aspirin EC tablet 81 mg  81 mg Oral Daily Opyd, Lavone Neri, MD   81 mg at 07/31/23 0858   atorvastatin (LIPITOR) tablet 80 mg  80 mg Oral QHS Briscoe Deutscher, MD   80 mg at 07/30/23 2242   doxepin (SINEQUAN) capsule 25 mg  25 mg Oral QHS Opyd, Lavone Neri, MD   25 mg at 07/30/23 2242   furosemide (LASIX) tablet 40 mg  40 mg Oral BID Opyd, Lavone Neri, MD   40 mg at 07/31/23 0857   lactulose (CHRONULAC) 10 GM/15ML solution 30 g  30 g Oral Q0600 Opyd, Lavone Neri, MD       pantoprazole (PROTONIX) EC tablet 40 mg  40 mg Oral Daily Opyd, Lavone Neri, MD   40 mg at 07/31/23 0857   potassium chloride 10 mEq in 100 mL IVPB  10 mEq Intravenous Q1 Hr x 5 Opyd, Timothy S, MD 90 mL/hr at 07/31/23 1000 10 mEq at 07/31/23 1000   senna-docusate (Senokot-S) tablet 1 tablet  1 tablet Oral QHS PRN Opyd, Lavone Neri, MD       Facility-Administered Medications Ordered in Other Encounters  Medication Dose Route Frequency Provider Last Rate Last Admin   dexamethasone (DECADRON) injection 8 mg  8 mg Intravenous Once Boisseau, Hayley, PA         Discharge Medications: Please see discharge summary for a list of discharge medications.  Relevant Imaging Results:  Relevant Lab Results:   Additional Information SSN: [id number]  [place]

## 2023-07-31 NOTE — Evaluation (Addendum)
Clinical/Bedside Swallow Evaluation Patient Details  Name: [name] MRN: [id number] Date of Birth: 02/20/1941  Today's Date: 07/31/2023 Time: SLP Start Time (ACUTE ONLY): 1030 SLP Stop Time (ACUTE ONLY): 1046 SLP Time Calculation (min) (ACUTE ONLY): 16 min  Past Medical History:  Past Medical History:  Diagnosis Date   Anemia    Anxiety    Aortic insufficiency    Moderate   Atrial fibrillation (HCC)    Back pain    Carotid stenosis, bilateral    Cirrhosis (HCC)    Closed left femoral fracture (HCC) 04/21/2017   Coronary artery disease    Stent x 2 RCA 1995, cardiac catheterization 09/2016 showing only mild atherosclerosis   DDD (degenerative disc disease), lumbar    Depression    Diabetes mellitus without complication (HCC)    Essential hypertension    Falls    GERD (gastroesophageal reflux disease)    Hiatal hernia    History of stroke    Hypercholesteremia    IBS (irritable bowel syndrome)    Non-alcoholic fatty liver disease    OSA (obstructive sleep apnea)    no CPAP   Pericardial effusion    a. small by echo 2018.   PSVT (paroxysmal supraventricular tachycardia) (HCC)    Recurrent UTI    Seizures (HCC) 2018   Stroke (HCC) 07/2019   several strokes within the last 10 years   Swallowing difficulty    Varices, esophageal (HCC)    Past Surgical History:  Past Surgical History:  Procedure Laterality Date   ANAL FISSURE REPAIR  1972   APPENDECTOMY     BACK SURGERY  1985   CARDIAC CATHETERIZATION  [phone]   Stent to the proximal RCA after MI    CARDIAC CATHETERIZATION N/A 09/26/2016   Procedure: Left Heart Cath and Coronary Angiography;  Surgeon: Marykay Lex, MD;  Location: [facility] INVASIVE CV LAB;  Service: Cardiovascular;  Laterality: N/A;   CATARACT EXTRACTION W/PHACO Right 07/04/2014   Procedure: CATARACT EXTRACTION PHACO AND INTRAOCULAR LENS PLACEMENT (IOC);  Surgeon: Loraine Leriche T. Nile Riggs, MD;  Location: [facility] OR[facility];  Service: Ophthalmology;  Laterality:  Right;  CDE:13.13   COLONOSCOPY     DILATION AND CURETTAGE OF UTERUS     x2   Epi Retinal Membrane Peel Left    ERCP     ESOPHAGEAL BANDING N/A 04/01/2013   Procedure: ESOPHAGEAL BANDING;  Surgeon: Malissa Hippo, MD;  Location: [facility];  Service: Endoscopy;  Laterality: N/A;   ESOPHAGEAL BANDING N/A 05/24/2013   Procedure: ESOPHAGEAL BANDING;  Surgeon: Malissa Hippo, MD;  Location: [facility];  Service: Endoscopy;  Laterality: N/A;   ESOPHAGEAL BANDING N/A 06/21/2014   Procedure: ESOPHAGEAL BANDING;  Surgeon: Malissa Hippo, MD;  Location: [facility];  Service: Endoscopy;  Laterality: N/A;   ESOPHAGEAL BANDING  06/15/2020   Procedure: ESOPHAGEAL BANDING;  Surgeon: Dolores Frame, MD;  Location: [facility];  Service: Gastroenterology;;   ESOPHAGOGASTRODUODENOSCOPY N/A 04/01/2013   Procedure: ESOPHAGOGASTRODUODENOSCOPY (EGD);  Surgeon: Malissa Hippo, MD;  [facility]: [facility];  Service: Endoscopy;  Laterality: N/A;  230-rescheduled to 8:30am Ann notified pt   ESOPHAGOGASTRODUODENOSCOPY N/A 05/24/2013   Procedure: ESOPHAGOGASTRODUODENOSCOPY (EGD);  Surgeon: Malissa Hippo, MD;  [facility];  Service: Endoscopy;  Laterality: N/A;  730   ESOPHAGOGASTRODUODENOSCOPY N/A 06/21/2014   Procedure: ESOPHAGOGASTRODUODENOSCOPY (EGD);  Surgeon: Malissa Hippo, MD;  [facility];  Service: Endoscopy;  Laterality: N/A;  930-rescheduled 10/14 @ 1200  Ann to notify pt   ESOPHAGOGASTRODUODENOSCOPY (EGD) WITH PROPOFOL N/A 06/15/2020   Procedure: ESOPHAGOGASTRODUODENOSCOPY (EGD) WITH PROPOFOL;  Surgeon: Dolores Frame, MD;  [facility];  Service: Gastroenterology;  Laterality: N/A;  230   ESOPHAGOGASTRODUODENOSCOPY (EGD) WITH PROPOFOL N/A 07/27/2020   Procedure: ESOPHAGOGASTRODUODENOSCOPY (EGD) WITH PROPOFOL;  Surgeon: Dolores Frame, MD;  [facility];  Service: Gastroenterology;  Laterality: N/A;  1245    ESOPHAGOGASTRODUODENOSCOPY (EGD) WITH PROPOFOL N/A 08/19/2022   Procedure: ESOPHAGOGASTRODUODENOSCOPY (EGD) WITH PROPOFOL;  Surgeon: Dolores Frame, MD;  Location: [facility];  Service: Gastroenterology;  Laterality: N/A;  130 ASA 3, pt knows to arrive at 6:15   EYE SURGERY  08   cataract surgery of the left eye   FEMUR IM NAIL Left 03/02/2017   Procedure: INTRAMEDULLARY (IM) NAIL FEMORAL;  Surgeon: Samson Frederic, MD;  Location: [facility] OR;  Service: Orthopedics;  Laterality: Left;   HARDWARE REMOVAL Right 01/17/2013   Procedure: REMOVAL OF HARDWARE AND EXCISION ULNAR STYLOID RIGHT WRIST;  Surgeon: Tami Ribas, MD;  Location: [facility];  Service: Orthopedics;  Laterality: Right;   implanted heart monitor  02/2020   to monitor Afib   IR 3D INDEPENDENT WKST  09/26/2022   IR ANGIOGRAM SELECTIVE EACH ADDITIONAL VESSEL  09/26/2022   IR ANGIOGRAM SELECTIVE EACH ADDITIONAL VESSEL  09/26/2022   IR ANGIOGRAM SELECTIVE EACH ADDITIONAL VESSEL  09/26/2022   IR ANGIOGRAM SELECTIVE EACH ADDITIONAL VESSEL  09/26/2022   IR ANGIOGRAM SELECTIVE EACH ADDITIONAL VESSEL  09/26/2022   IR ANGIOGRAM SELECTIVE EACH ADDITIONAL VESSEL  10/10/2022   IR ANGIOGRAM SELECTIVE EACH ADDITIONAL VESSEL  10/10/2022   IR ANGIOGRAM SELECTIVE EACH ADDITIONAL VESSEL  10/10/2022   IR ANGIOGRAM VISCERAL SELECTIVE  09/26/2022   IR ANGIOGRAM VISCERAL SELECTIVE  10/10/2022   IR EMBO TUMOR ORGAN ISCHEMIA INFARCT INC GUIDE ROADMAPPING  10/10/2022   IR INTRAVASCULAR ULTRASOUND NON CORONARY  09/26/2022   IR RADIOLOGIST EVAL & MGMT  09/10/2022   IR RADIOLOGIST EVAL & MGMT  11/03/2022   IR RADIOLOGIST EVAL & MGMT  02/10/2023   IR RADIOLOGIST EVAL & MGMT  05/15/2023   IR TRANSCATH PLC STENT 1ST ART NOT LE CV CAR VERT CAR  09/26/2022   IR US GUIDE VASC ACCESS RIGHT  09/26/2022   IR US GUIDE VASC ACCESS RIGHT  10/10/2022   MYRINGOTOMY  2012   both ears   ORIF FEMUR FRACTURE Left 04/23/2017   Procedure: OPEN REDUCTION INTERNAL FIXATION  (ORIF) DISTAL FEMUR FRACTURE (FRACTURE AROUND FEMORAL NAIL);  Surgeon: Myrene Galas, MD;  Location: [facility];  Service: Orthopedics;  Laterality: Left;   TONSILLECTOMY     TUBAL LIGATION     VAGINAL HYSTERECTOMY  1972   WRIST SURGERY     rt wrist hardwear removal   HPI:  82 y.o. female with medical history significant for atrial fibrillation on Eliquis, CAD, aortic stenosis, cirrhosis, history of CVA, and hepatocellular carcinoma status post radioembolization who now presents with change in mental status. CXR reveals: "Large right and moderate left pleural effusions. Partial compressive atelectasis of the lower lobes versus pneumonia." Passed [facility] but reported difficulty after that with RN. BSE requested   Assessment / Plan / Recommendation  Clinical Impression  Clinical swallowing evaluation completed while Pt was sitting upright in recliner. Pt did pass the [place], however RN reports Pt had difficulty with liquids after the [facility] therefore request for full BSE. Pt consumed all textures and and consistencies without overt s/sx of  oropharyngeal dysphagia. Pt is confused and fluctuating mentation could negatively impact swallowing at times. Recommend initiate regular diet with thin liquids. Recommend meds be crushed in puree; spoke with granddaughter via phone and GI has recommended meds be crushed. Acknowledge SLE and will complete. There are no further needs for dysphagia, will complete that order. Thank you. SLP Visit Diagnosis: Dysphagia, pharyngeal phase (R13.13)    Aspiration Risk  Mild aspiration risk    Diet Recommendation Regular;Thin liquid    Liquid Administration via: Cup;Straw Medication Administration: Whole meds with liquid Supervision: Patient able to self feed Compensations: Minimize environmental distractions;Slow rate;Small sips/bites Postural Changes: Seated upright at 90 degrees    Other  Recommendations Oral Care Recommendations: Oral care BID    Recommendations for  follow up therapy are one component of a multi-disciplinary discharge planning process, led by the attending physician.  Recommendations may be updated based on patient status, additional functional criteria and insurance authorization.  Follow up Recommendations No SLP follow up      Assistance Recommended at Discharge    Functional Status Assessment Patient has had a recent decline in their functional status and demonstrates the ability to make significant improvements in function in a reasonable and predictable amount of time.  Frequency and Duration            Prognosis Barriers to Reach Goals: Cognitive deficits      Swallow Study   General Date of Onset: 07/30/23 HPI: 82 y.o. female with medical history significant for atrial fibrillation on Eliquis, CAD, aortic stenosis, cirrhosis, history of CVA, and hepatocellular carcinoma status post radioembolization who now presents with change in mental status. Type of Study: Bedside Swallow Evaluation Previous Swallow Assessment: BSE 2019 - briefly on NTL but upgraded to thin during next ST tx Diet Prior to this Study: NPO Temperature Spikes Noted: No Respiratory Status: Nasal cannula History of Recent Intubation: No Behavior/Cognition: Alert;Cooperative;Pleasant mood Oral Cavity Assessment: Within Functional Limits Oral Care Completed by SLP: Recent completion by staff Oral Cavity - Dentition: Adequate natural dentition Vision: Functional for self-feeding Self-Feeding Abilities: Able to feed self Patient Positioning: Upright in chair Baseline Vocal Quality: Normal Volitional Cough: Strong Volitional Swallow: Able to elicit    Oral/Motor/Sensory Function Overall Oral Motor/Sensory Function: Within functional limits   Ice Chips Ice chips: Within functional limits   Thin Liquid Thin Liquid: Within functional limits    Nectar Thick Nectar Thick Liquid: Not tested   Honey Thick Honey Thick Liquid: Not tested   Puree Puree: Within  functional limits   Solid     Solid: Within functional limits     Syed Zukas H. Romie Levee, CCC-SLP Speech Language Pathologist  Georgetta Haber 07/31/2023,10:56 AM

## 2023-07-31 NOTE — Progress Notes (Signed)
Patient tolerated right sided thoracentesis procedure well today and 1.3 Liters of clear yellow pleural fluid removed and sent to lab for processing. Patient verbalized understanding of post procedure instructions and taken for post chest xray via stretcher this time with no acute distress noted.

## 2023-07-31 NOTE — Consult Note (Addendum)
I connected with  [name] on 07/31/23 by a video enabled telemedicine application and verified that I am speaking with the correct person using two identifiers.   I discussed the limitations of evaluation and management by telemedicine. The patient expressed understanding and agreed to proceed.   Location of patient: [facility] Location of physician: [facility]   Neurology Consultation Reason for Consult: stroke Referring Physician: Dr Marcial Pacas Opyd  CC: ams  History is obtained from:   HPI: [name] is a 82 y.o. female with past medical history of atrial fibrillation on Eliquis, prior strokes without any definite residual deficits, coronary artery disease status post stenting, carotid artery stenosis, dementia, hypertension, hyperlipidemia who is brought in by family due to trouble speaking.  Patient states she has had trouble speaking for 23 days.   However I was able to talk to patient's son who states he noticed that yesterday morning around.   Last known normal: Around 10 am 07/30/2023 Event happened at home No tPA as outside window No thrombectomy as outside window and very distal occlusion not amenable to treatment mRS +4 (uses wheelchair for the last 2 months due to recent hip surgery, he is able to walk a few steps and get to the wheelchair)   ROS: All other systems reviewed and negative except as noted in the HPI.   Past Medical History:  Diagnosis Date   Anemia    Anxiety    Aortic insufficiency    Moderate   Atrial fibrillation (HCC)    Back pain    Carotid stenosis, bilateral    Cirrhosis (HCC)    Closed left femoral fracture (HCC) 04/21/2017   Coronary artery disease    Stent x 2 RCA 1995, cardiac catheterization 09/2016 showing only mild atherosclerosis   DDD (degenerative disc disease), lumbar    Depression    Diabetes mellitus without complication (HCC)    Essential hypertension    Falls    GERD (gastroesophageal reflux disease)    Hiatal  hernia    History of stroke    Hypercholesteremia    IBS (irritable bowel syndrome)    Non-alcoholic fatty liver disease    OSA (obstructive sleep apnea)    no CPAP   Pericardial effusion    a. small by echo 2018.   PSVT (paroxysmal supraventricular tachycardia) (HCC)    Recurrent UTI    Seizures (HCC) 2018   Stroke (HCC) 07/2019   several strokes within the last 10 years   Swallowing difficulty    Varices, esophageal (HCC)     Family History  Problem Relation Age of Onset   Diabetes Mother    Heart failure Father    Heart failure Maternal Aunt     Social History:  reports that she quit smoking about 12 months ago. Her smoking use included cigarettes. She started smoking about 51 years ago. She has a 12.5 pack-year smoking history. She has been exposed to tobacco smoke. She has never used smokeless tobacco. She reports that she does not drink alcohol and does not use drugs.   Medications Prior to Admission  Medication Sig Dispense Refill Last Dose   acetaminophen (TYLENOL) 500 MG tablet Take 1,000 mg by mouth at bedtime.   07/29/2023   apixaban (ELIQUIS) 5 MG TABS tablet TAKE (1) TABLET BY MOUTH TWICE DAILY. 60 tablet 5 07/29/2023   aspirin EC 81 MG tablet Take 1 tablet (81 mg total) by mouth daily. Swallow whole. 30 tablet 12 07/29/2023  atorvastatin (LIPITOR) 80 MG tablet Take 1 tablet (80 mg total) by mouth daily. (Patient taking differently: Take 80 mg by mouth at bedtime.) 30 tablet 1 07/29/2023   azelastine (ASTELIN) 0.1 % nasal spray Place 1 spray into both nostrils at bedtime.   07/29/2023   CALCIUM CITRATE PO Take 200 mg by mouth daily.   07/29/2023   carvedilol (COREG) 6.25 MG tablet TAKE (1) TABLET BY MOUTH TWICE DAILY WITH A MEAL. 180 tablet 3 07/29/2023   Cholecalciferol (VITAMIN D3) 50 MCG (2000 UT) TABS Take 2,000 Units by mouth daily.   07/29/2023   Coenzyme Q10 (CO Q-10) 100 MG CAPS Take 100 mg by mouth in the morning.   07/29/2023   diltiazem (CARDIZEM CD)  240 MG 24 hr capsule Take 1 capsule (240 mg total) by mouth daily. 90 capsule 3 07/29/2023   doxepin (SINEQUAN) 25 MG capsule Take 25 mg by mouth at bedtime.   07/29/2023   furosemide (LASIX) 40 MG tablet Take 1 tablet (40 mg total) by mouth 2 (two) times daily. 180 tablet 3 07/29/2023   lactulose (CHRONULAC) 10 GM/15ML solution Take 30 g by mouth daily at 6 (six) AM.   07/29/2023   Multiple Vitamin (MULTIVITAMIN WITH MINERALS) TABS tablet Take 1 tablet by mouth daily.   07/29/2023   omeprazole (PRILOSEC) 40 MG capsule TAKE (1) CAPSULE BY MOUTH ONCE DAILY. 90 capsule 0 07/29/2023   potassium chloride SA (KLOR-CON M) 20 MEQ tablet Take 20 mEq by mouth 2 (two) times daily.   07/29/2023   nitroGLYCERIN (NITROSTAT) 0.4 MG SL tablet Place 0.4 mg under the tongue every 5 (five) minutes as needed for chest pain. (Patient not taking: Reported on 07/30/2023)   Not Taking   ondansetron (ZOFRAN-ODT) 4 MG disintegrating tablet Take 4 mg by mouth every 8 (eight) hours as needed for nausea or vomiting. (Patient not taking: Reported on 07/30/2023)   Not Taking      Exam: Current vital signs: BP (!) 114/51   Pulse 69   Temp 98.4 F (36.9 C) (Oral)   Resp 18   Ht 5\' 2"  (1.575 m)   Wt 60.8 kg   SpO2 95%   BMI 24.52 kg/m  Vital signs in last 24 hours: Temp:  [98 F (36.7 C)-98.4 F (36.9 C)] 98.4 F (36.9 C) (11/22 0434) Pulse Rate:  [69-88] 69 (11/22 0434) Resp:  [18] 18 (11/22 0434) BP: (114-131)/(49-60) 114/51 (11/22 0434) SpO2:  [95 %-98 %] 95 % (11/22 0434) Weight:  [57.2 kg-60.8 kg] 60.8 kg (11/21 2136)   Physical Exam  Constitutional: Appears well-developed and well-nourished.  Psych: Affect appropriate to situation Neuro: Awake, alert, oriented to place and person not to time, has some trouble naming objects but able to follow commands, cranial nerves appear grossly intact, antigravity strength without drift in all 4 extremities, FTN intact bilaterally, sensory intact to light  touch  NIHSS 1  INPUTS: 1A: Level of consciousness --> 0 = Alert; keenly responsive 1B: Ask month and age --> 0 = Both questions right 1C: 'Blink eyes' & 'squeeze hands' --> 0 = Performs both tasks 2: Horizontal extraocular movements --> 0 = Normal 3: Visual fields --> 0 = No visual loss 4: Facial palsy --> 0 = Normal symmetry 5A: Left arm motor drift --> 0 = No drift for 10 seconds 5B: Right arm motor drift --> 0 = No drift for 10 seconds 6A: Left leg motor drift --> 0 = No drift for 5 seconds 6B: Right leg  motor drift --> 0 = No drift for 5 seconds 7: Limb Ataxia --> 0 = No ataxia 8: Sensation --> 0 = Normal; no sensory loss 9: Language/aphasia --> 1 = Mild-moderate aphasia: some obvious changes, without significant limitation 10: Dysarthria --> 0 = Normal 11: Extinction/inattention --> 0 = No abnormality   I have reviewed labs in epic and the results pertinent to this consultation are: CBC:  Recent Labs  Lab 07/30/23 1450 07/31/23 0439  WBC 6.4 4.8  HGB 13.3 10.8*  HCT 39.8 32.0*  MCV 95.0 93.8  PLT 137* 100*    Basic Metabolic Panel:  Lab Results  Component Value Date   NA 139 07/31/2023   K 2.6 (LL) 07/31/2023   CO2 24 07/31/2023   GLUCOSE 99 07/31/2023   BUN 12 07/31/2023   CREATININE 0.82 07/31/2023   CALCIUM 8.1 (L) 07/31/2023   GFRNONAA >60 07/31/2023   GFRAA 73 06/04/2020   Lipid Panel:  Lab Results  Component Value Date   LDLCALC 77 07/31/2023   HgbA1c:  Lab Results  Component Value Date   HGBA1C 5.3 07/30/2023   Urine Drug Screen:     Component Value Date/Time   LABOPIA NONE DETECTED 12/06/2021 0131   COCAINSCRNUR NONE DETECTED 12/06/2021 0131   LABBENZ NONE DETECTED 12/06/2021 0131   AMPHETMU NONE DETECTED 12/06/2021 0131   THCU NONE DETECTED 12/06/2021 0131   LABBARB NONE DETECTED 12/06/2021 0131    Alcohol Level     Component Value Date/Time   ETH <10 12/05/2021 2300     I have reviewed the images obtained:  MRI Brain without  contrast 07/30/2023: 3.4 cm acute involving the anterior left insula and overlying operculum, left MCA distribution. No associated hemorrhage or significant mass effect. Underlying age-related cerebral volume loss with chronic microvascular ischemic disease, with multiple remote infarcts as above.   MRA head without contrast 07/30/2023: Probable acute left M3 occlusion, corresponding with the evolving left MCA distribution infarct. Focal severe distal right P2 stenosis. Otherwise wide patency of the major intracranial arterial vasculature.     ASSESSMENT/PLAN: 82 year old female with history of A-fib on Eliquis brought in with aphasia and noted to have acute ischemic stroke.  Acute ischemic stroke -Most likely cardioembolic.  Patient is already on Eliquis and family reports compliance  Recommendations: -Although there is not enough evidence to switch anticoagulants to reduce stroke recurrence, patient has had multiple strokes while being compliant on Eliquis.  Discussed with Dr. Pearlean Brownie about switching to Pradaxa. -Will check with insurance if they would approve Pradaxa.  If approved, recommend starting Pradaxa 150 mg twice daily from tomorrow morning -If not approved by insurance, can resume Eliquis from tomorrow morning -Continue aspirin 81 mg daily and atorvastatin 80 mg daily -Also discussed with son that due to age and dementia, patient may not be a candidate.  However if interested, can discuss pursuing Watchman device with patient's cardiologist.  Patient's grand daughters are nurses.  Therefore son will speak with the granddaughters about this -PT/OT/speech therapy -Goal blood pressure: Normotension -Stroke education -If carotid ultrasound and echo do not show any acute/unstable clot, okay to discharge from neurology standpoint -Follow-up with [facility] in 2 to 3 months (order placed) -Discussed plan with patient's son via phone and Dr. Laural Benes via secure  chat   Thank you for allowing [place] to participate in the care of this patient. If you have any further questions, please contact  me or neurohospitalist.   Lindie Spruce [facility] neurohospitalist

## 2023-07-31 NOTE — Progress Notes (Addendum)
PROGRESS NOTE   Karlette Cantwell Capp  SWN:[id number] DOB: Oct 03, 1940 DOA: 07/30/2023 PCP: Elfredia Nevins, MD   Chief Complaint  Patient presents with   Altered Mental Status   Level of care: Telemetry  Brief Admission History:  82 y.o. female with medical history significant for atrial fibrillation on Eliquis, CAD, aortic stenosis, cirrhosis, history of CVA, and hepatocellular carcinoma status post radioembolization who now presents with change in mental status.    Patient reportedly went to bed in her usual state last night at approximately 11 PM but was noted upon waking today to be moving very slowly.  She would make eye contact, but was not responding verbally to family.  This lasted approximately 2 hours before she began speaking again.  She has not been complaining of anything.  She has not appeared to be short of breath.   ED Course: Upon arrival to the ED, patient is found to be afebrile and saturating well on room air with normal heart rate and stable blood pressure.  Labs are most notable for potassium 3.1, creatinine 1.09, albumin 2.9, bilirubin 3.8, and lactic acid 2.5.  MRI brain reveals acute ischemic infarction in left MCA distribution.  Chest x-ray notable for bilateral pleural effusions.  CT chest was ordered and performed but not yet read.   Neurology (Dr. Amada Jupiter) was consulted by the ED physician and recommended holding Eliquis, continuing aspirin, and admitting the patient to [facility] for stroke workup.  She was given 1 L NS, Rocephin, and azithromycin in the ED.   Assessment and Plan:  Acute ischemic CVA  - continue full CVA work up - I consulted with neurologist Dr. Melynda Ripple who will see by telemedicine - follow up carotid dopplers - TTE pending - holding apixaban per neuro rec - continuing aspirin and atorvastatin - SLP requested for swallow eval - PT/OT pending - telemetry   Bilateral pleural effusions R>L - pt was sent for US thoracentesis from  radiologist  History of hepatocellular carcinoma and liver cirrhosis  - resumed home lactulose and lasix - he is followed by Dr. Ellin Saba   Atrial Fibrillation  - per initial neuro recs, hold apixaban  - further recs to follow formal neuro consultation  Essential hypertension  - permissive hypertension for now  Stage 3a CKD - stable, follow   CAD - no CP symptoms - resumed atorvastatin, aspirin - fasting lipid panel   Chronic HFpEF - appears stable at this time  Hypokalemia - IV replacement ordered and Mg - recheck in AM   DVT prophylaxis: SCDs Code Status: Full  Family Communication:  Disposition:   Consultants:  Neurologist Dr. Melynda Ripple   Procedures:   Antimicrobials:  Azithromycin 11/21>> Ceftriaxone 11/21>>   Subjective: No specific complaints.  Objective: Vitals:   07/30/23 1821 07/30/23 1821 07/30/23 2136 07/31/23 0434  BP:  (!) 123/54 120/60 (!) 114/51  Pulse:  88 80 69  Resp:  18 18 18   Temp: 98.3 F (36.8 C) 98.3 F (36.8 C) 98 F (36.7 C) 98.4 F (36.9 C)  TempSrc: Oral Oral Oral Oral  SpO2:  97% 97% 95%  Weight:   60.8 kg   Height:   5\' 2"  (1.575 m)     Intake/Output Summary (Last 24 hours) at 07/31/2023 0958 Last data filed at 07/31/2023 0902 Gross per 24 hour  Intake 300.1 ml  Output 0 ml  Net 300.1 ml   Filed Weights   07/30/23 1415 07/30/23 2136  Weight: 57.2 kg 60.8 kg   Examination:  General exam: Appears calm and comfortable  Respiratory system: Clear to auscultation. Respiratory effort normal. Cardiovascular system: normal S1 & S2 heard. No JVD, murmurs, rubs, gallops or clicks. No pedal edema. Gastrointestinal system: Abdomen is nondistended, soft and nontender. No organomegaly or masses felt. Normal bowel sounds heard. Central nervous system: Alert and oriented to person and place. LLE 4/5 strength, RLE, LUE, LLE 5/5 strength.  Extremities: Symmetric 5 x 5 power. Skin: No rashes, lesions or ulcers. Psychiatry:  Judgement and insight appear normal. Mood & affect appropriate.   Data Reviewed: I have personally reviewed following labs and imaging studies  CBC: Recent Labs  Lab 07/30/23 1450 07/31/23 0439  WBC 6.4 4.8  HGB 13.3 10.8*  HCT 39.8 32.0*  MCV 95.0 93.8  PLT 137* 100*    Basic Metabolic Panel: Recent Labs  Lab 07/30/23 1450 07/31/23 0439  NA 138 139  K 3.1* 2.6*  CL 103 108  CO2 23 24  GLUCOSE 157* 99  BUN 14 12  CREATININE 1.09* 0.82  CALCIUM 8.7* 8.1*  MG  --  1.7    CBG: Recent Labs  Lab 07/30/23 1414  GLUCAP 127*    Recent Results (from the past 240 hour(s))  Blood culture (routine x 2)     Status: None (Preliminary result)   Collection Time: 07/30/23  7:11 PM   Specimen: BLOOD  Result Value Ref Range Status   Specimen Description BLOOD BLOOD LEFT FOREARM  Final   Special Requests   Final    BOTTLES DRAWN AEROBIC AND ANAEROBIC Blood Culture results may not be optimal due to an excessive volume of blood received in culture bottles   Culture   Final    NO GROWTH < 12 HOURS Performed at [facility], [address], [place], [place] [address]    Report Status PENDING  Incomplete  Blood culture (routine x 2)     Status: None (Preliminary result)   Collection Time: 07/30/23  7:11 PM   Specimen: BLOOD  Result Value Ref Range Status   Specimen Description BLOOD BLOOD RIGHT FOREARM  Final   Special Requests   Final    BOTTLES DRAWN AEROBIC AND ANAEROBIC Blood Culture results may not be optimal due to an excessive volume of blood received in culture bottles   Culture   Final    NO GROWTH < 12 HOURS Performed at [facility], [address], [place], [place] [address]    Report Status PENDING  Incomplete     Radiology Studies: CT Chest W Contrast  Result Date: 07/30/2023 CLINICAL DATA:  Abnormal chest radiograph.  Pulmonary nodule. EXAM: CT CHEST WITH CONTRAST TECHNIQUE: Multidetector CT imaging of the chest was performed during intravenous contrast  administration. RADIATION DOSE REDUCTION: This exam was performed according to the departmental dose-optimization program which includes automated exposure control, adjustment of the mA and/or kV according to patient size and/or use of iterative reconstruction technique. CONTRAST:  75mL OMNIPAQUE IOHEXOL 300 MG/ML  SOLN COMPARISON:  Chest CT dated 8523 and radiograph dated 07/30/2023. FINDINGS: Evaluation of this exam is limited due to respiratory motion. Cardiovascular: There is no cardiomegaly or pericardial effusion. Advanced 3 vessel coronary vascular calcification and calcification of the mitral annulus. Moderate atherosclerotic calcification of the thoracic aorta. No aneurysmal dilatation or dissection. The origins of the great vessels of the aortic arch and the central pulmonary arteries appear patent. Mediastinum/Nodes: No obvious hilar adenopathy. Evaluation however is limited due to consolidative changes of the lungs and pleural effusions. Lymph  node anterior to the carina measures 15 mm short axis. The esophagus is grossly unremarkable. No mediastinal fluid collection. Lungs/Pleura: Large right and moderate left pleural effusions. There is partial compressive atelectasis of the lower lobes versus pneumonia. There is near complete collapse of the right middle lobe, new since the prior CT. This is of indeterminate etiology and a centrally occlusive mass is not excluded. Several calcified granuloma noted in the right hilum adjacent to the right middle lobe bronchi. There is no pneumothorax. The central airways remain patent. Upper Abdomen: Cirrhosis.  Dilated upper abdominal collaterals. Musculoskeletal: Osteopenia with degenerative changes. No acute osseous pathology. IMPRESSION: 1. Large right and moderate left pleural effusions. Partial compressive atelectasis of the lower lobes versus pneumonia. 2. Near complete collapse of the right middle lobe, of indeterminate etiology. A centrally occlusive mass is  not excluded. Bronchoscopy may provide better evaluation. 3. Advanced 3 vessel coronary vascular calcification. 4. Cirrhosis. 5.  Aortic Atherosclerosis (ICD10-I70.0). Electronically Signed   By: Elgie Collard M.D.   On: 07/30/2023 20:34   DG Chest 2 View  Result Date: 07/30/2023 CLINICAL DATA:  Weakness. EXAM: CHEST - 2 VIEW COMPARISON:  Chest radiograph dated 06/03/2022. FINDINGS: Bilateral pleural effusions, right greater than left, with associated bibasilar atelectasis. Pneumonia is not excluded. No pneumothorax. Stable cardiac silhouette. A loop recorder device. Atherosclerotic calcification of the aorta. Degenerative changes of the spine. No acute osseous pathology. IMPRESSION: Bilateral pleural effusions, right greater than left, with associated bibasilar atelectasis. Electronically Signed   By: Elgie Collard M.D.   On: 07/30/2023 19:19   MR BRAIN WO CONTRAST  Result Date: 07/30/2023 CLINICAL DATA:  Initial evaluation for mental status change, unknown cause. EXAM: MRI HEAD WITHOUT CONTRAST MRA HEAD WITHOUT CONTRAST TECHNIQUE: Multiplanar, multi-echo pulse sequences of the brain and surrounding structures were acquired without intravenous contrast. Angiographic images of the Circle of Willis were acquired using MRA technique without intravenous contrast. COMPARISON:  Prior study from 12/06/2021. FINDINGS: MRI HEAD FINDINGS Brain: Age-related cerebral volume loss. Patchy T2/FLAIR hyperintensity involving the periventricular deep white matter both cerebral hemispheres as well as the pons, consistent with chronic small vessel ischemic disease, mild in nature. Encephalomalacia and gliosis involving the right frontal operculum, consistent with a chronic right MCA distribution infarct. Additional small remote left PCA territory infarct involving the left occipital lobe noted. Few scattered remote cerebellar infarcts noted, left greater than right. Few scattered remote lacunar infarcts present about  the deep gray nuclei bilaterally. Approximate 3.4 cm area of restricted diffusion involving the anterior left insula and overlying operculum, consistent with acute left MCA distribution infarct. No significant mass effect or associated hemorrhage. No other evidence for acute or subacute ischemia. No other acute intracranial hemorrhage. No significant chronic intracranial blood products. No mass lesion, midline shift or mass effect. No hydrocephalus or extra-axial fluid collection. Pituitary gland and suprasellar region within normal limits. Vascular: Major intracranial vascular flow voids are maintained. Skull and upper cervical spine: Craniocervical junction within normal limits. Bone marrow signal intensity normal. No scalp soft tissue abnormality. Sinuses/Orbits: Prior bilateral ocular lens replacement. Paranasal sinuses are clear. Large right mastoid effusion noted. Additional trace left mastoid effusion. Visualized nasopharynx unremarkable. Other: None. MRA HEAD FINDINGS Anterior circulation: Examination mildly degraded by motion artifact. Both internal carotid arteries are patent through the siphons without significant stenosis or other abnormality. A1 segments, anterior communicating artery complex common anterior cerebral arteries patent without stenosis. No M1 stenosis or occlusion. Right MCA branches patent and well perfused. On the left,  there is a probable acute left M3 occlusion (series 5, image 177), corresponding with the evolving left MCA distribution infarct. Remainder of the left MCA branches remain patent and perfused. Posterior circulation: Both V4 segments patent without stenosis. Both PICA patent at their origins. Basilar widely patent without stenosis. Superior cerebellar arteries patent bilaterally. Both PCAs primarily supplied via the basilar. Left PCA patent to its distal aspect without significant stenosis. Focal severe distal right P2 stenosis (series 1032, image 13). Right PCA otherwise  patent as well. Anatomic variants: None significant.  No intracranial aneurysm. IMPRESSION: MRI HEAD: 1. 3.4 cm acute involving the anterior left insula and overlying operculum, left MCA distribution. No associated hemorrhage or significant mass effect. 2. Underlying age-related cerebral volume loss with chronic microvascular ischemic disease, with multiple remote infarcts as above. MRA HEAD: 1. Probable acute left M3 occlusion, corresponding with the evolving left MCA distribution infarct. 2. Focal severe distal right P2 stenosis. 3. Otherwise wide patency of the major intracranial arterial vasculature. Electronically Signed   By: Rise Mu M.D.   On: 07/30/2023 18:44   MR ANGIO HEAD WO CONTRAST  Result Date: 07/30/2023 CLINICAL DATA:  Initial evaluation for mental status change, unknown cause. EXAM: MRI HEAD WITHOUT CONTRAST MRA HEAD WITHOUT CONTRAST TECHNIQUE: Multiplanar, multi-echo pulse sequences of the brain and surrounding structures were acquired without intravenous contrast. Angiographic images of the Circle of Willis were acquired using MRA technique without intravenous contrast. COMPARISON:  Prior study from 12/06/2021. FINDINGS: MRI HEAD FINDINGS Brain: Age-related cerebral volume loss. Patchy T2/FLAIR hyperintensity involving the periventricular deep white matter both cerebral hemispheres as well as the pons, consistent with chronic small vessel ischemic disease, mild in nature. Encephalomalacia and gliosis involving the right frontal operculum, consistent with a chronic right MCA distribution infarct. Additional small remote left PCA territory infarct involving the left occipital lobe noted. Few scattered remote cerebellar infarcts noted, left greater than right. Few scattered remote lacunar infarcts present about the deep gray nuclei bilaterally. Approximate 3.4 cm area of restricted diffusion involving the anterior left insula and overlying operculum, consistent with acute left MCA  distribution infarct. No significant mass effect or associated hemorrhage. No other evidence for acute or subacute ischemia. No other acute intracranial hemorrhage. No significant chronic intracranial blood products. No mass lesion, midline shift or mass effect. No hydrocephalus or extra-axial fluid collection. Pituitary gland and suprasellar region within normal limits. Vascular: Major intracranial vascular flow voids are maintained. Skull and upper cervical spine: Craniocervical junction within normal limits. Bone marrow signal intensity normal. No scalp soft tissue abnormality. Sinuses/Orbits: Prior bilateral ocular lens replacement. Paranasal sinuses are clear. Large right mastoid effusion noted. Additional trace left mastoid effusion. Visualized nasopharynx unremarkable. Other: None. MRA HEAD FINDINGS Anterior circulation: Examination mildly degraded by motion artifact. Both internal carotid arteries are patent through the siphons without significant stenosis or other abnormality. A1 segments, anterior communicating artery complex common anterior cerebral arteries patent without stenosis. No M1 stenosis or occlusion. Right MCA branches patent and well perfused. On the left, there is a probable acute left M3 occlusion (series 5, image 177), corresponding with the evolving left MCA distribution infarct. Remainder of the left MCA branches remain patent and perfused. Posterior circulation: Both V4 segments patent without stenosis. Both PICA patent at their origins. Basilar widely patent without stenosis. Superior cerebellar arteries patent bilaterally. Both PCAs primarily supplied via the basilar. Left PCA patent to its distal aspect without significant stenosis. Focal severe distal right P2 stenosis (series 1032, image 13). Right  PCA otherwise patent as well. Anatomic variants: None significant.  No intracranial aneurysm. IMPRESSION: MRI HEAD: 1. 3.4 cm acute involving the anterior left insula and overlying  operculum, left MCA distribution. No associated hemorrhage or significant mass effect. 2. Underlying age-related cerebral volume loss with chronic microvascular ischemic disease, with multiple remote infarcts as above. MRA HEAD: 1. Probable acute left M3 occlusion, corresponding with the evolving left MCA distribution infarct. 2. Focal severe distal right P2 stenosis. 3. Otherwise wide patency of the major intracranial arterial vasculature. Electronically Signed   By: Rise Mu M.D.   On: 07/30/2023 18:44    Scheduled Meds:   stroke: early stages of recovery book   Does not apply Once   aspirin EC  81 mg Oral Daily   atorvastatin  80 mg Oral QHS   doxepin  25 mg Oral QHS   furosemide  40 mg Oral BID   lactulose  30 g Oral Q0600   pantoprazole  40 mg Oral Daily   Continuous Infusions:  potassium chloride 10 mEq (07/31/23 0856)    LOS: 0 days   Time spent: 58 mins  Infant Doane Laural Benes, MD How to contact the [facility] Attending or Consulting provider 7A - 7P or covering provider during after hours 7P -7A, for this patient?  Check the care team in [facility] and look for a) attending/consulting TRH provider listed and b) the [facility] team listed Log into [email or web address] to find provider on call.  Locate the [facility] provider you are looking for under [facility] and page to a number that you can be directly reached. If you still have difficulty reaching the provider, please page the [facility] (Director on Call) for the Hospitalists listed on amion for assistance.  07/31/2023, 9:58 AM

## 2023-07-31 NOTE — TOC Initial Note (Signed)
Transition of Care [facility]) - Initial/Assessment Note    Patient Details  Name: [name] MRN: [id number] Date of Birth: 24-Dec-1940  Transition of [facility]) CM/SW Contact:    Barbara Gault, LCSW Phone Number: 07/31/2023, 10:56 AM  Clinical Narrative:                  Pt from home with family. PT/OT recommending SNF rehab at dc. Spoke with pt's first family contact, gdtr [name], to discuss dc planning.  Pt resides at home with family members who assist with ADLs as needed. [name] and pt's other family members are agreeable to SNF rehab at dc. CMS provider options reviewed. Will refer as requested and will start insurance auth. MD anticipating dc tomorrow.  Will follow.  Expected Discharge Plan: Skilled Nursing Facility Barriers to Discharge: Continued Medical Work up   Patient Goals and CMS Choice Patient states their goals for this hospitalization and ongoing recovery are:: get better CMS Medicare.gov Compare Post Acute Care list provided to:: Patient Represenative (must comment) Choice offered to / list presented to : Adult Children [place] ownership interest in [facility].provided to:: Adult Children    Expected Discharge Plan and Services In-house Referral: Clinical Social Work     Living arrangements for the past 2 months: Single Family Home                                      Prior Living Arrangements/Services Living arrangements for the past 2 months: Single Family Home Lives with:: Relatives Patient language and need for interpreter reviewed:: Yes Do you feel safe going back to the place where you live?: Yes      Need for Family Participation in Patient Care: Yes (Comment) Care giver support system in place?: Yes (comment)   Criminal Activity/Legal Involvement Pertinent to Current Situation/Hospitalization: No - Comment as needed  Activities of Daily Living      Permission Sought/Granted Permission sought to share  information with : Facility Industrial/product designer granted to share information with : Yes, Verbal Permission Granted     Permission granted to share info w AGENCY: snfs        Emotional Assessment Appearance:: Appears stated age     Orientation: : Oriented to Self, Oriented to Place Alcohol / Substance Use: Not Applicable Psych Involvement: No (comment)  Admission diagnosis:  Acute ischemic stroke [facility]) [I63.9] Patient Active Problem List   Diagnosis Date Noted   Acute ischemic stroke (HCC) 07/30/2023   CKD stage 3a, GFR 45-59 ml/min (HCC) 07/30/2023   Pleural effusion 07/30/2023   Encephalopathy, hepatic (HCC) 11/03/2022   Cancer, hepatocellular (HCC) 08/21/2022   Gastroesophageal reflux disease with esophagitis without hemorrhage 07/01/2022   Abnormal CT of liver 07/01/2022   Community acquired pneumonia    Chronic diastolic HF (heart failure) (HCC)    Sepsis secondary to UTI (HCC) 04/12/2022   CVA (cerebral vascular accident) (HCC) 12/06/2021   NASH (nonalcoholic steatohepatitis) 06/05/2020   History of esophageal varices 06/05/2020   Persistent atrial fibrillation (HCC) 03/09/2020   Paroxysmal atrial fibrillation (HCC) 02/24/2020   Secondary hypercoagulable state (HCC) 02/24/2020   Acute CVA (cerebrovascular accident) (HCC) 01/03/2020   Tobacco use disorder 07/09/2018   Small vessel disease (HCC) 07/09/2018   Benign essential HTN    Seizures (HCC)    Dysphagia    Dysarthria 05/19/2018   Syncope and collapse    Closed  left femoral fracture (HCC) 04/21/2017   Seizure disorder (HCC) 04/21/2017   Closed pertrochanteric fracture of femur, left, initial encounter (HCC) 03/02/2017   Seizure (HCC) 02/28/2017   Hip fracture (HCC) 02/28/2017   Diabetes mellitus without complication (HCC) 01/20/2017   Epiretinal membrane (ERM) of left eye 01/20/2017   Pseudophakia 01/20/2017   NSTEMI (non-ST elevated myocardial infarction) (HCC) 11/02/2016   Elevated troponin  09/24/2016   Hypokalemia 09/23/2016   TIA (transient ischemic attack) 05/05/2016   Nausea and vomiting 10/15/2015   Acute coronary syndrome (HCC) 06/01/2015   Thrombocytopenia (HCC) 06/01/2015   Cerebral infarction (HCC) right occipital due to small vessel disease s/p tPA 03/06/2014   PSVT (paroxysmal supraventricular tachycardia) (HCC) 12/09/2013   Esophageal varices without bleeding (HCC) 03/29/2013   Hematemesis 03/29/2013   Cirrhosis of liver without ascites (HCC) 03/29/2013   Presence of stent in right coronary artery 07/04/2011   OSA (obstructive sleep apnea) 07/04/2011   Aortic sclerosis 07/04/2011   Aortic insufficiency 07/04/2011   HTN (hypertension) 07/04/2011   Carotid stenosis, bilateral 07/04/2011   CAD (coronary artery disease) 07/03/2011   Hypercholesteremia 07/03/2011   GERD (gastroesophageal reflux disease) 07/03/2011   PCP:  Barbara Nevins, MD Pharmacy:   [facility] [place], [place] - [address] [place] [address] Phone: [phone] Fax: [phone]     Social Determinants of Health (SDOH) Social History: SDOH Screenings   Food Insecurity: No Food Insecurity (07/31/2023)  Housing: Patient Declined (07/31/2023)  Transportation Needs: No Transportation Needs (07/31/2023)  Utilities: Patient Declined (07/30/2023)  Alcohol Screen: Low Risk  (03/16/2019)  Depression (PHQ2-9): Low Risk  (09/26/2021)  Financial Resource Strain: Low Risk  (09/15/2022)  Physical Activity: Inactive (03/16/2019)  Social Connections: Moderately Isolated (03/16/2019)  Stress: No Stress Concern Present (03/16/2019)  Tobacco Use: Medium Risk (07/30/2023)   SDOH Interventions:     Readmission Risk Interventions    04/14/2022    3:34 PM 04/13/2022   10:50 AM  Readmission Risk Prevention Plan  Transportation Screening Complete Complete  PCP or Specialist Appt within 5-7 Days Not Complete   Home Care Screening Complete   Medication Review (RN  CM) Complete   HRI or Home Care Consult  Complete  Social Work Consult for Recovery Care Planning/Counseling  Complete  Palliative Care Screening  Not Applicable  Medication Review Oceanographer)  Complete

## 2023-07-31 NOTE — Plan of Care (Signed)
  Problem: Acute Rehab PT Goals(only PT should resolve) Goal: Pt Will Go Supine/Side To Sit Outcome: Progressing Flowsheets (Taken 07/31/2023 0959) Pt will go Supine/Side to Sit: with supervision Goal: Patient Will Transfer Sit To/From Stand Outcome: Progressing Flowsheets (Taken 07/31/2023 0959) Patient will transfer sit to/from stand: with contact guard assist Goal: Pt Will Transfer Bed To Chair/Chair To Bed Outcome: Progressing Flowsheets (Taken 07/31/2023 0959) Pt will Transfer Bed to Chair/Chair to Bed: with contact guard assist Goal: Pt Will Ambulate Outcome: Progressing Flowsheets (Taken 07/31/2023 0959) Pt will Ambulate:  10 feet  with minimal assist  with rolling walker

## 2023-07-31 NOTE — Progress Notes (Signed)
  Echocardiogram 2D Echocardiogram has been performed.  Delcie Roch 07/31/2023, 3:09 PM

## 2023-07-31 NOTE — Telephone Encounter (Signed)
Pharmacy Patient Advocate Encounter  Received notification from [facility] that Prior Authorization for Dabigatran Etexilate Mesylate 150MG  capsules  has been APPROVED from 07/31/2023 to 09/07/2024. Ran test claim, Copay is $4.50. This test claim was processed through [facility] copay amounts may vary at other pharmacies due to pharmacy/plan contracts, or as the patient moves through the different stages of their insurance plan.   PA #/Case ID/Reference #: [id number]

## 2023-07-31 NOTE — Telephone Encounter (Signed)
Pharmacy Patient Advocate Encounter   Received notification that prior authorization for Dabigatran Etexilate Mesylate 150MG  capsules is required/requested.   Insurance verification completed.   The patient is insured through [facility] .   Per test claim: PA required; PA submitted to above mentioned insurance via CoverMyMeds Key/confirmation #/EOC [id number] Status is pending

## 2023-07-31 NOTE — Plan of Care (Signed)
  Problem: Acute Rehab OT Goals (only OT should resolve) Goal: Pt. Will Perform Grooming Flowsheets (Taken 07/31/2023 1113) Pt Will Perform Grooming:  with modified independence  standing Goal: Pt. Will Perform Lower Body Dressing Flowsheets (Taken 07/31/2023 1113) Pt Will Perform Lower Body Dressing:  with modified independence  sitting/lateral leans Goal: Pt. Will Perform Toileting-Clothing Manipulation Flowsheets (Taken 07/31/2023 1113) Pt Will Perform Toileting - Clothing Manipulation and hygiene:  with modified independence  sitting/lateral leans Goal: Pt/Caregiver Will Perform Home Exercise Program Flowsheets (Taken 07/31/2023 1113) Pt/caregiver will Perform Home Exercise Program:  Increased strength  Both right and left upper extremity  Independently  Colin Norment OT, MOT

## 2023-07-31 NOTE — Evaluation (Signed)
Physical Therapy Evaluation Patient Details Name: [name] MRN: [id number] DOB: 03/25/1941 Today's Date: 07/31/2023  History of Present Illness  [name] is a 82 y.o. female with medical history significant for atrial fibrillation on Eliquis, CAD, aortic stenosis, cirrhosis, history of CVA, and hepatocellular carcinoma status post radioembolization who now presents with change in mental status.      Patient reportedly went to bed in her usual state last night at approximately 11 PM but was noted upon waking today to be moving very slowly.  She would make eye contact, but was not responding verbally to family.  This lasted approximately 2 hours before she began speaking again.  She has not been complaining of anything.  She has not appeared to be short of breath.    Clinical Impression  PT arrives; patient sitting on the edge of bed sitting up with OT assist.  Patient's answers regarding history, home set up, etc. Do not match chart review. Patient tends to lean back with sitting on the edge of the bed but does not lose her balance. Sit to stand with min A to the RW.  Patient is able to take a few steps over to the chair with RW and min A; slow cadence and shortened step length.  Needs cues for safety and sequencing before sitting down in the chair.  patient left in chair with call button in reach, chair alarm set and nursing notified of mobility status. Patient will benefit from continued skilled therapy services during the remainder of her hospital stay and at the next recommended venue of care to address deficits and promote return to optimal function.             If plan is discharge home, recommend the following: A little help with walking and/or transfers;A little help with bathing/dressing/bathroom;Assistance with cooking/housework   Can travel by private vehicle   Yes    Equipment Recommendations None recommended by PT  Recommendations for Other Services        Functional Status Assessment Patient has had a recent decline in their functional status and demonstrates the ability to make significant improvements in function in a reasonable and predictable amount of time.     Precautions / Restrictions Precautions Precautions: Fall Restrictions Weight Bearing Restrictions: No      Mobility  Bed Mobility Overal bed mobility: Needs Assistance Bed Mobility: Supine to Sit     Supine to sit: Contact guard, Supervision       Patient Response: Cooperative  Transfers Overall transfer level: Needs assistance Equipment used: Rolling walker (2 wheels) Transfers: Sit to/from Stand, Bed to chair/wheelchair/BSC Sit to Stand: Min assist   Step pivot transfers: Min assist            Ambulation/Gait Ambulation/Gait assistance: Min assist Gait Distance (Feet): 3 Feet Assistive device: Rolling walker (2 wheels) Gait Pattern/deviations: Decreased step length - left, Decreased step length - right       General Gait Details: left foot inverted  Stairs            Wheelchair Mobility     Tilt Bed Tilt Bed Patient Response: Cooperative  Modified Rankin (Stroke Patients Only)       Balance Overall balance assessment: Needs assistance Sitting-balance support: Bilateral upper extremity supported, Feet unsupported Sitting balance-Leahy Scale: Fair Sitting balance - Comments: fair sitting balance; tends to lean backwards Postural control: Posterior lean Standing balance support: Bilateral upper extremity supported, During functional activity, Reliant on assistive device for  balance Standing balance-Leahy Scale: Fair Standing balance comment: fair to good standing balance with RW and CGA                             Pertinent Vitals/Pain Pain Assessment Pain Assessment: No/denies pain    Home Living Family/patient expects to be discharged to:: Private residence Living Arrangements: Other relatives Available Help at  Discharge: Family;Available 24 hours/day Type of Home: House Home Access: Stairs to enter Entrance Stairs-Rails: Right Entrance Stairs-Number of Steps: 3 steps from car to yard, 2 steps from yard to [place] Layout: One level Home Equipment: Agricultural consultant (2 wheels);Wheelchair - Customer service manager - single point Additional Comments: from chart review; patient an inaccurate historian    Prior Function Prior Level of Function : Needs assist             Mobility Comments: uses WC for mobility; able to take a few steps during transfers using RW, otherwise stand pivots for most transfers ADLs Comments: assisted by family for chart review     Extremity/Trunk Assessment   Upper Extremity Assessment Upper Extremity Assessment: Defer to OT evaluation    Lower Extremity Assessment Lower Extremity Assessment: Generalized weakness    Cervical / Trunk Assessment Cervical / Trunk Assessment: Kyphotic  Communication   Communication Communication: No apparent difficulties  Cognition Arousal: Alert Behavior During Therapy: Impulsive Overall Cognitive Status: No family/caregiver present to determine baseline cognitive functioning                                 General Comments: patient answers do not match chart review        General Comments General comments (skin integrity, edema, etc.): noted redness and swelling bilateral lower extremities    Exercises     Assessment/Plan    PT Assessment Patient needs continued PT services  PT Problem List Decreased strength;Decreased activity tolerance;Decreased balance;Decreased mobility       PT Treatment Interventions DME instruction;Gait training;Functional mobility training;Therapeutic activities;Therapeutic exercise;Balance training;Patient/family education    PT Goals (Current goals can be found in the Care Plan section)  Acute Rehab PT Goals Patient Stated Goal: return home PT Goal  Formulation: With patient Time For Goal Achievement: 08/14/23 Potential to Achieve Goals: Good    Frequency Min 2X/week     Co-evaluation PT/OT/SLP Co-Evaluation/Treatment: Yes Reason for Co-Treatment: Necessary to address cognition/behavior during functional activity;To address functional/ADL transfers PT goals addressed during session: Mobility/safety with mobility;Balance         AM-PAC PT "6 Clicks" Mobility  Outcome Measure Help needed turning from your back to your side while in a flat bed without using bedrails?: A Little Help needed moving from lying on your back to sitting on the side of a flat bed without using bedrails?: A Little Help needed moving to and from a bed to a chair (including a wheelchair)?: A Little Help needed standing up from a chair using your arms (e.g., wheelchair or bedside chair)?: A Little Help needed to walk in hospital room?: A Lot Help needed climbing 3-5 steps with a railing? : Total 6 Click Score: 15    End of Session Equipment Utilized During Treatment: Gait belt Activity Tolerance: Patient tolerated treatment well Patient left: in chair;with call bell/phone within reach;with chair alarm set Nurse Communication: Mobility status PT Visit Diagnosis: Other abnormalities of gait and mobility (R26.89);Muscle weakness (generalized) (  M62.81)    Time: 4098-1191 PT Time Calculation (min) (ACUTE ONLY): 20 min   Charges:   PT Evaluation $PT Eval Low Complexity: 1 Low   PT General Charges $$ ACUTE PT VISIT: 1 Visit         9:58 AM, 07/31/23 Rayman Petrosian Small Kendyll Huettner MPT [facility] [place] [phone] Ph:[phone]

## 2023-07-31 NOTE — Progress Notes (Signed)
Patient has removed her brief, attempted to remove the purewick and gown. Patient removed tele stickers from her body and placed the box on the over the bed table. Will inform provider. Informed CN and was instructed to move patient to a room closer to the desk after staff has completed care.

## 2023-08-01 DIAGNOSIS — D649 Anemia, unspecified: Secondary | ICD-10-CM | POA: Diagnosis not present

## 2023-08-01 DIAGNOSIS — I2510 Atherosclerotic heart disease of native coronary artery without angina pectoris: Secondary | ICD-10-CM | POA: Diagnosis not present

## 2023-08-01 DIAGNOSIS — R488 Other symbolic dysfunctions: Secondary | ICD-10-CM | POA: Diagnosis not present

## 2023-08-01 DIAGNOSIS — E876 Hypokalemia: Secondary | ICD-10-CM | POA: Diagnosis not present

## 2023-08-01 DIAGNOSIS — E43 Unspecified severe protein-calorie malnutrition: Secondary | ICD-10-CM | POA: Diagnosis not present

## 2023-08-01 DIAGNOSIS — I8500 Esophageal varices without bleeding: Secondary | ICD-10-CM | POA: Diagnosis not present

## 2023-08-01 DIAGNOSIS — K2100 Gastro-esophageal reflux disease with esophagitis, without bleeding: Secondary | ICD-10-CM | POA: Diagnosis not present

## 2023-08-01 DIAGNOSIS — E119 Type 2 diabetes mellitus without complications: Secondary | ICD-10-CM | POA: Diagnosis not present

## 2023-08-01 DIAGNOSIS — I639 Cerebral infarction, unspecified: Secondary | ICD-10-CM | POA: Diagnosis not present

## 2023-08-01 DIAGNOSIS — I352 Nonrheumatic aortic (valve) stenosis with insufficiency: Secondary | ICD-10-CM | POA: Diagnosis not present

## 2023-08-01 DIAGNOSIS — N1831 Chronic kidney disease, stage 3a: Secondary | ICD-10-CM | POA: Diagnosis not present

## 2023-08-01 DIAGNOSIS — I480 Paroxysmal atrial fibrillation: Secondary | ICD-10-CM | POA: Diagnosis not present

## 2023-08-01 DIAGNOSIS — K7581 Nonalcoholic steatohepatitis (NASH): Secondary | ICD-10-CM | POA: Diagnosis not present

## 2023-08-01 DIAGNOSIS — C220 Liver cell carcinoma: Secondary | ICD-10-CM | POA: Diagnosis not present

## 2023-08-01 DIAGNOSIS — I129 Hypertensive chronic kidney disease with stage 1 through stage 4 chronic kidney disease, or unspecified chronic kidney disease: Secondary | ICD-10-CM | POA: Diagnosis not present

## 2023-08-01 DIAGNOSIS — C23 Malignant neoplasm of gallbladder: Secondary | ICD-10-CM | POA: Diagnosis not present

## 2023-08-01 DIAGNOSIS — K7682 Hepatic encephalopathy: Secondary | ICD-10-CM | POA: Diagnosis not present

## 2023-08-01 DIAGNOSIS — I10 Essential (primary) hypertension: Secondary | ICD-10-CM | POA: Diagnosis not present

## 2023-08-01 DIAGNOSIS — E785 Hyperlipidemia, unspecified: Secondary | ICD-10-CM | POA: Diagnosis not present

## 2023-08-01 DIAGNOSIS — R1312 Dysphagia, oropharyngeal phase: Secondary | ICD-10-CM | POA: Diagnosis not present

## 2023-08-01 DIAGNOSIS — E1169 Type 2 diabetes mellitus with other specified complication: Secondary | ICD-10-CM | POA: Diagnosis not present

## 2023-08-01 DIAGNOSIS — I69398 Other sequelae of cerebral infarction: Secondary | ICD-10-CM | POA: Diagnosis not present

## 2023-08-01 DIAGNOSIS — R278 Other lack of coordination: Secondary | ICD-10-CM | POA: Diagnosis not present

## 2023-08-01 DIAGNOSIS — I6523 Occlusion and stenosis of bilateral carotid arteries: Secondary | ICD-10-CM | POA: Diagnosis not present

## 2023-08-01 DIAGNOSIS — I350 Nonrheumatic aortic (valve) stenosis: Secondary | ICD-10-CM | POA: Diagnosis not present

## 2023-08-01 DIAGNOSIS — I700 Atherosclerosis of aorta: Secondary | ICD-10-CM | POA: Diagnosis not present

## 2023-08-01 DIAGNOSIS — I130 Hypertensive heart and chronic kidney disease with heart failure and stage 1 through stage 4 chronic kidney disease, or unspecified chronic kidney disease: Secondary | ICD-10-CM | POA: Diagnosis not present

## 2023-08-01 DIAGNOSIS — I4819 Other persistent atrial fibrillation: Secondary | ICD-10-CM | POA: Diagnosis not present

## 2023-08-01 DIAGNOSIS — K7460 Unspecified cirrhosis of liver: Secondary | ICD-10-CM | POA: Diagnosis not present

## 2023-08-01 DIAGNOSIS — R262 Difficulty in walking, not elsewhere classified: Secondary | ICD-10-CM | POA: Diagnosis not present

## 2023-08-01 DIAGNOSIS — R1900 Intra-abdominal and pelvic swelling, mass and lump, unspecified site: Secondary | ICD-10-CM | POA: Diagnosis not present

## 2023-08-01 DIAGNOSIS — K769 Liver disease, unspecified: Secondary | ICD-10-CM | POA: Diagnosis not present

## 2023-08-01 DIAGNOSIS — E1159 Type 2 diabetes mellitus with other circulatory complications: Secondary | ICD-10-CM | POA: Diagnosis not present

## 2023-08-01 DIAGNOSIS — E782 Mixed hyperlipidemia: Secondary | ICD-10-CM | POA: Diagnosis not present

## 2023-08-01 DIAGNOSIS — I63412 Cerebral infarction due to embolism of left middle cerebral artery: Secondary | ICD-10-CM | POA: Diagnosis not present

## 2023-08-01 DIAGNOSIS — K8020 Calculus of gallbladder without cholecystitis without obstruction: Secondary | ICD-10-CM | POA: Diagnosis not present

## 2023-08-01 DIAGNOSIS — R4701 Aphasia: Secondary | ICD-10-CM | POA: Diagnosis not present

## 2023-08-01 DIAGNOSIS — I5032 Chronic diastolic (congestive) heart failure: Secondary | ICD-10-CM | POA: Diagnosis not present

## 2023-08-01 DIAGNOSIS — E1122 Type 2 diabetes mellitus with diabetic chronic kidney disease: Secondary | ICD-10-CM | POA: Diagnosis not present

## 2023-08-01 DIAGNOSIS — M6281 Muscle weakness (generalized): Secondary | ICD-10-CM | POA: Diagnosis not present

## 2023-08-01 DIAGNOSIS — J90 Pleural effusion, not elsewhere classified: Secondary | ICD-10-CM | POA: Diagnosis not present

## 2023-08-01 DIAGNOSIS — Z8673 Personal history of transient ischemic attack (TIA), and cerebral infarction without residual deficits: Secondary | ICD-10-CM | POA: Diagnosis not present

## 2023-08-01 DIAGNOSIS — F339 Major depressive disorder, recurrent, unspecified: Secondary | ICD-10-CM | POA: Diagnosis not present

## 2023-08-01 DIAGNOSIS — I152 Hypertension secondary to endocrine disorders: Secondary | ICD-10-CM | POA: Diagnosis not present

## 2023-08-01 LAB — BASIC METABOLIC PANEL
Anion gap: 11 (ref 5–15)
BUN: 9 mg/dL (ref 8–23)
CO2: 25 mmol/L (ref 22–32)
Calcium: 7.6 mg/dL — ABNORMAL LOW (ref 8.9–10.3)
Chloride: 106 mmol/L (ref 98–111)
Creatinine, Ser: 0.78 mg/dL (ref 0.44–1.00)
GFR, Estimated: >60 mL/min (ref >60)
Glucose, Bld: 99 mg/dL (ref 70–99)
Potassium: 2.8 mmol/L — ABNORMAL LOW (ref 3.5–5.1)
Sodium: 142 mmol/L (ref 135–145)

## 2023-08-01 LAB — MAGNESIUM: Magnesium: 1.7 mg/dL (ref 1.7–2.4)

## 2023-08-01 LAB — ALBUMIN: Albumin: 2.3 g/dL — ABNORMAL LOW (ref 3.5–5.0)

## 2023-08-01 MED ORDER — CARVEDILOL 3.125 MG PO TABS
6.2500 mg | ORAL_TABLET | Freq: Once | ORAL | Status: AC
  Administered 2023-08-01: 6.25 mg via ORAL
  Filled 2023-08-01: qty 2

## 2023-08-01 MED ORDER — POTASSIUM CHLORIDE CRYS ER 20 MEQ PO TBCR
40.0000 meq | EXTENDED_RELEASE_TABLET | Freq: Once | ORAL | Status: AC
  Administered 2023-08-01: 40 meq via ORAL
  Filled 2023-08-01: qty 2

## 2023-08-01 MED ORDER — MAGNESIUM SULFATE 4 GM/100ML IV SOLN
4.0000 g | Freq: Once | INTRAVENOUS | Status: AC
  Administered 2023-08-01: 4 g via INTRAVENOUS
  Filled 2023-08-01: qty 100

## 2023-08-01 MED ORDER — DABIGATRAN ETEXILATE MESYLATE 150 MG PO CAPS: 150.0000 mg | ORAL_CAPSULE | Freq: Two times a day (BID) | ORAL | Status: DC

## 2023-08-01 MED ORDER — ATORVASTATIN CALCIUM 80 MG PO TABS: 80.0000 mg | ORAL_TABLET | Freq: Every day | ORAL | Status: DC

## 2023-08-01 NOTE — Plan of Care (Signed)
  Problem: Education: Goal: Knowledge of General Education information will improve Description: Including pain rating scale, medication(s)/side effects and non-pharmacologic comfort measures Outcome: Progressing   Problem: Health Behavior/Discharge Planning: Goal: Ability to manage health-related needs will improve Outcome: Progressing   Problem: Clinical Measurements: Goal: Ability to maintain clinical measurements within normal limits will improve Outcome: Progressing Goal: Will remain free from infection Outcome: Progressing Goal: Diagnostic test results will improve Outcome: Progressing Goal: Respiratory complications will improve Outcome: Progressing Goal: Cardiovascular complication will be avoided Outcome: Progressing   Problem: Activity: Goal: Risk for activity intolerance will decrease Outcome: Progressing   Problem: Nutrition: Goal: Adequate nutrition will be maintained Outcome: Progressing   Problem: Coping: Goal: Level of anxiety will decrease Outcome: Progressing   Problem: Elimination: Goal: Will not experience complications related to bowel motility Outcome: Progressing Goal: Will not experience complications related to urinary retention Outcome: Progressing   Problem: Pain Management: Goal: General experience of comfort will improve Outcome: Progressing   Problem: Safety: Goal: Ability to remain free from injury will improve Outcome: Progressing   Problem: Skin Integrity: Goal: Risk for impaired skin integrity will decrease Outcome: Progressing   Problem: Education: Goal: Knowledge of disease or condition will improve Outcome: Progressing Goal: Knowledge of secondary prevention will improve (MUST DOCUMENT ALL) Outcome: Progressing Goal: Knowledge of patient specific risk factors will improve Loraine Leriche N/A or DELETE if not current risk factor) Outcome: Progressing   Problem: Ischemic Stroke/TIA Tissue Perfusion: Goal: Complications of ischemic  stroke/TIA will be minimized Outcome: Progressing   Problem: Coping: Goal: Will verbalize positive feelings about self Outcome: Progressing Goal: Will identify appropriate support needs Outcome: Progressing   Problem: Health Behavior/Discharge Planning: Goal: Ability to manage health-related needs will improve Outcome: Progressing Goal: Goals will be collaboratively established with patient/family Outcome: Progressing   Problem: Self-Care: Goal: Ability to participate in self-care as condition permits will improve Outcome: Progressing Goal: Verbalization of feelings and concerns over difficulty with self-care will improve Outcome: Progressing Goal: Ability to communicate needs accurately will improve Outcome: Progressing   Problem: Nutrition: Goal: Risk of aspiration will decrease Outcome: Progressing Goal: Dietary intake will improve Outcome: Progressing

## 2023-08-01 NOTE — TOC Progression Note (Signed)
Transition of Care [facility]) - Progression Note    Patient Details  Name: [name] MRN: [id number] Date of Birth: 04/17/1941  [facility] CM/SW Contact  Catalina Gravel, [place] Phone Number: 08/01/2023, 10:04 AM  Clinical Narrative:    CSW reviewed Vesta Mixer approved 11/23-11/26 #[id number]. Secure chat to MD and call to [facility]- message left.    Expected Discharge Plan: Skilled Nursing Facility Barriers to Discharge: Continued Medical Work up  Expected Discharge Plan and Services In-house Referral: Clinical Social Work     Living arrangements for the past 2 months: Single Family Home                                       Social Determinants of Health (SDOH) Interventions SDOH Screenings   Food Insecurity: No Food Insecurity (07/31/2023)  Housing: Patient Declined (07/31/2023)  Transportation Needs: No Transportation Needs (07/31/2023)  Utilities: Patient Declined (07/30/2023)  Alcohol Screen: Low Risk  (03/16/2019)  Depression (PHQ2-9): Low Risk  (09/26/2021)  Financial Resource Strain: Low Risk  (09/15/2022)  Physical Activity: Inactive (03/16/2019)  Social Connections: Moderately Isolated (03/16/2019)  Stress: No Stress Concern Present (03/16/2019)  Tobacco Use: Medium Risk (07/31/2023)    Readmission Risk Interventions    04/14/2022    3:34 PM 04/13/2022   10:50 AM  Readmission Risk Prevention Plan  Transportation Screening Complete Complete  PCP or Specialist Appt within 5-7 Days Not Complete   Home Care Screening Complete   Medication Review (RN CM) Complete   HRI or Home Care Consult  Complete  Social Work Consult for Recovery Care Planning/Counseling  Complete  Palliative Care Screening  Not Applicable  Medication Review Oceanographer)  Complete

## 2023-08-01 NOTE — Discharge Summary (Signed)
Physician Discharge Summary  [name] GEX:[id number] DOB: [birth year] DOA: 07/30/2023  PCP: Elfredia Nevins, MD  Admit date: 07/30/2023 Discharge date: 08/01/2023   Disposition: [facility]   Recommendations for Outpatient Follow-up:  Follow up with [facility] in 2-3 months  Follow up with [facility] as scheduled  Please check BMP/CBC in week  Discharge Condition: STABLE   CODE STATUS: FULL DIET: heart healthy as tolerated     Brief Hospitalization Summary: Please see all hospital notes, images, labs for full details of the hospitalization. Admission provider HPI: 82 y.o. female with medical history significant for atrial fibrillation on Eliquis, CAD, aortic stenosis, cirrhosis, history of CVA, and hepatocellular carcinoma status post radioembolization who now presents with change in mental status.    Patient reportedly went to bed in her usual state last night at approximately 11 PM but was noted upon waking today to be moving very slowly.  She would make eye contact, but was not responding verbally to family.  This lasted approximately 2 hours before she began speaking again.  She has not been complaining of anything.  She has not appeared to be short of breath.   ED Course: Upon arrival to the ED, patient is found to be afebrile and saturating well on room air with normal heart rate and stable blood pressure.  Labs are most notable for potassium 3.1, creatinine 1.09, albumin 2.9, bilirubin 3.8, and lactic acid 2.5.  MRI brain reveals acute ischemic infarction in left MCA distribution.  Chest x-ray notable for bilateral pleural effusions.  CT chest was ordered and performed but not yet read.   Neurology (Dr. Amada Jupiter) was consulted by the ED physician and recommended holding Eliquis, continuing aspirin, and admitting the patient to [facility] for stroke workup.  She was given 1 L NS, Rocephin, and azithromycin in the ED.  Hospital Course by problem list   Acute  ischemic CVA likely cardioembolic  - completed full CVA work up - I consulted with neurologist Dr. Melynda Ripple who will see by telemedicine - carotid dopplers: Moderate calcified plaque of the internal carotid artery origins bilaterally with Doppler measurements suggestive of 50-69% stenosis. - TTE: LVEF>75% Moderate to severe AI and moderate to severe AS.  - per neuro, changed apixaban to pradaxa which started on 11/22 150 mg BID - continuing aspirin 81 mg and atorvastatin 80 mg daily - SLP completed and regular diet ordered - PT/OT recommending SNF    Bilateral pleural effusions R>L - pt was sent for US thoracentesis from radiologist and successfully removed 1.3 L of pleural fluid from right lung   History of hepatocellular carcinoma and liver cirrhosis  - resumed home lactulose and lasix - he is followed by Dr. Ellin Saba  - outpatient MRI ordered by oncology    Atrial Fibrillation  - per initial neuro recs, hold apixaban  - further recs to follow formal neuro consultation   Essential hypertension  - permissive hypertension, followed by resuming home BP meds   Stage 3a CKD - stable, follow    CAD - no CP symptoms - resumed atorvastatin, aspirin - fasting lipid panel: LDL 77 - continue atorvastatin 80 mg every evening    Chronic HFpEF - appears stable at this time   Hypokalemia - IV replacement ordered and Mg - continue oral potassium replacement - recheck BMP in 1 week recommended   Discharge Diagnoses:  Principal Problem:   Acute ischemic stroke [facility]) Active Problems:   CAD (coronary artery disease)   Cirrhosis  of liver without ascites (HCC)   Hypokalemia   Benign essential HTN   Diabetes mellitus without complication (HCC)   Acute CVA (cerebrovascular accident) (HCC)   Persistent atrial fibrillation (HCC)   Chronic diastolic HF (heart failure) (HCC)   Cancer, hepatocellular (HCC)   CKD stage 3a, GFR 45-59 ml/min (HCC)   Pleural effusion   Discharge  Instructions: Discharge Instructions     Ambulatory referral to Neurology   Complete by: As directed    An appointment is requested in approximately: 8-12 weeks      Allergies as of 08/01/2023       Reactions   Tape Rash, Other (See Comments)   Old paper tape caused a reaction, doesn't react to most other tapes         Medication List     STOP taking these medications    azelastine 0.1 % nasal spray Commonly known as: ASTELIN   Eliquis 5 MG Tabs tablet Generic drug: apixaban   nitroGLYCERIN 0.4 MG SL tablet Commonly known as: NITROSTAT   ondansetron 4 MG disintegrating tablet Commonly known as: ZOFRAN-ODT       TAKE these medications    acetaminophen 500 MG tablet Commonly known as: TYLENOL Take 1,000 mg by mouth at bedtime.   aspirin EC 81 MG tablet Take 1 tablet (81 mg total) by mouth daily. Swallow whole.   atorvastatin 80 MG tablet Commonly known as: LIPITOR Take 1 tablet (80 mg total) by mouth at bedtime.   CALCIUM CITRATE PO Take 200 mg by mouth daily.   carvedilol 6.25 MG tablet Commonly known as: COREG TAKE (1) TABLET BY MOUTH TWICE DAILY WITH A MEAL.   Co Q-10 100 MG Caps Take 100 mg by mouth in the morning.   dabigatran 150 MG Caps capsule Commonly known as: PRADAXA Take 1 capsule (150 mg total) by mouth every 12 (twelve) hours.   diltiazem 240 MG 24 hr capsule Commonly known as: CARDIZEM CD Take 1 capsule (240 mg total) by mouth daily.   doxepin 25 MG capsule Commonly known as: SINEQUAN Take 25 mg by mouth at bedtime.   furosemide 40 MG tablet Commonly known as: Lasix Take 1 tablet (40 mg total) by mouth 2 (two) times daily.   lactulose 10 GM/15ML solution Commonly known as: CHRONULAC Take 30 g by mouth daily at 6 (six) AM.   multivitamin with minerals Tabs tablet Take 1 tablet by mouth daily.   omeprazole 40 MG capsule Commonly known as: PRILOSEC TAKE (1) CAPSULE BY MOUTH ONCE DAILY.   potassium chloride SA 20 MEQ  tablet Commonly known as: KLOR-CON M Take 20 mEq by mouth 2 (two) times daily.   Vitamin D3 50 MCG (2000 UT) Tabs Take 2,000 Units by mouth daily.        Contact information for follow-up providers     [facility]. Schedule an appointment as soon as possible for a visit in 2 month(s).   Specialty: Neurology Why: Hospital Follow Up Contact information: [address] [place] [address] [phone]             Contact information for after-discharge care     Destination     [facility] .   Service: Skilled Nursing Contact information: [address] [place] [address] [place] [address] [phone]                    Allergies  Allergen Reactions  Tape Rash and Other (See Comments)    Old paper tape caused a reaction, doesn't react to most other tapes    Allergies as of 08/01/2023       Reactions   Tape Rash, Other (See Comments)   Old paper tape caused a reaction, doesn't react to most other tapes         Medication List     STOP taking these medications    azelastine 0.1 % nasal spray Commonly known as: ASTELIN   Eliquis 5 MG Tabs tablet Generic drug: apixaban   nitroGLYCERIN 0.4 MG SL tablet Commonly known as: NITROSTAT   ondansetron 4 MG disintegrating tablet Commonly known as: ZOFRAN-ODT       TAKE these medications    acetaminophen 500 MG tablet Commonly known as: TYLENOL Take 1,000 mg by mouth at bedtime.   aspirin EC 81 MG tablet Take 1 tablet (81 mg total) by mouth daily. Swallow whole.   atorvastatin 80 MG tablet Commonly known as: LIPITOR Take 1 tablet (80 mg total) by mouth at bedtime.   CALCIUM CITRATE PO Take 200 mg by mouth daily.   carvedilol 6.25 MG tablet Commonly known as: COREG TAKE (1) TABLET BY MOUTH TWICE DAILY WITH A MEAL.   Co Q-10 100 MG Caps Take 100 mg by mouth in the morning.   dabigatran 150 MG Caps  capsule Commonly known as: PRADAXA Take 1 capsule (150 mg total) by mouth every 12 (twelve) hours.   diltiazem 240 MG 24 hr capsule Commonly known as: CARDIZEM CD Take 1 capsule (240 mg total) by mouth daily.   doxepin 25 MG capsule Commonly known as: SINEQUAN Take 25 mg by mouth at bedtime.   furosemide 40 MG tablet Commonly known as: Lasix Take 1 tablet (40 mg total) by mouth 2 (two) times daily.   lactulose 10 GM/15ML solution Commonly known as: CHRONULAC Take 30 g by mouth daily at 6 (six) AM.   multivitamin with minerals Tabs tablet Take 1 tablet by mouth daily.   omeprazole 40 MG capsule Commonly known as: PRILOSEC TAKE (1) CAPSULE BY MOUTH ONCE DAILY.   potassium chloride SA 20 MEQ tablet Commonly known as: KLOR-CON M Take 20 mEq by mouth 2 (two) times daily.   Vitamin D3 50 MCG (2000 UT) Tabs Take 2,000 Units by mouth daily.        Procedures/Studies: ECHOCARDIOGRAM COMPLETE  Result Date: 07/31/2023    ECHOCARDIOGRAM REPORT   Patient Name:   ALINNA BURKHOLDER Pinney Date of Exam: 07/31/2023 Medical Rec #:  [id number]      Height:       62.0 in Accession #:    [id number]     Weight:       134.0 lb Date of Birth:  1940/11/24     BSA:          1.613 m Patient Age:    81 years       BP:           124/57 mmHg Patient Gender: F              HR:           92 bpm. Exam Location:  Inpatient Procedure: 2D Echo, Cardiac Doppler and Color Doppler Indications:    stroke  History:        Patient has prior history of Echocardiogram examinations, most                 recent 08/20/2022. CAD,  chronic kidney disease,                 Arrythmias:Atrial Fibrillation, Signs/Symptoms:Altered Mental                 Status; Risk Factors:Hypertension.  Sonographer:    Delcie Roch RDCS Referring Phys: [id number] TIMOTHY S OPYD  Sonographer Comments: Image acquisition challenging due to respiratory motion. IMPRESSIONS  1. Left ventricular ejection fraction, by estimation, is >75%. The left ventricle  has hyperdynamic function. The left ventricle has no regional wall motion abnormalities. Left ventricular diastolic parameters are indeterminate.  2. Right ventricular systolic function is normal. The right ventricular size is normal. Tricuspid regurgitation signal is inadequate for assessing PA pressure.  3. Left atrial size was severely dilated.  4. The mitral valve is abnormal. Mild mitral valve regurgitation. Moderate mitral stenosis. The mean mitral valve gradient is 7.5 mmHg. Severe mitral annular calcification.  5. The aortic valve was not well visualized. Aortic valve regurgitation is moderate to severe. Suboptimal suprasternal window for quantification of AR. Visually appears to be severe AR but moderate AR by PHT of 341 msec. Moderate to severe aortic valve stenosis. Aortic valve area, by VTI measures 0.77 cm. Aortic valve mean gradient measures 25.0 mmHg. Aortic valve Vmax measures 3.37 m/s. DVI is 0.30.  6. The inferior vena cava is normal in size with greater than 50% respiratory variability, suggesting right atrial pressure of 3 mmHg. Comparison(s): Changes from prior study are noted. Moderate to severe AI and moderate to severe AS. FINDINGS  Left Ventricle: Left ventricular ejection fraction, by estimation, is >75%. The left ventricle has hyperdynamic function. The left ventricle has no regional wall motion abnormalities. The left ventricular internal cavity size was normal in size. There is no left ventricular hypertrophy. Left ventricular diastolic parameters are indeterminate. Right Ventricle: The right ventricular size is normal. No increase in right ventricular wall thickness. Right ventricular systolic function is normal. Tricuspid regurgitation signal is inadequate for assessing PA pressure. Left Atrium: Left atrial size was severely dilated. Right Atrium: Right atrial size was normal in size. Pericardium: There is no evidence of pericardial effusion. Mitral Valve: The mitral valve is  abnormal. Severe mitral annular calcification. Mild mitral valve regurgitation. Moderate mitral valve stenosis. MV peak gradient, 15.6 mmHg. The mean mitral valve gradient is 7.5 mmHg. Tricuspid Valve: The tricuspid valve is not well visualized. Tricuspid valve regurgitation is not demonstrated. No evidence of tricuspid stenosis. Aortic Valve: The aortic valve was not well visualized. Aortic valve regurgitation is moderate to severe. Aortic regurgitation PHT measures 341 msec. Moderate to severe aortic stenosis is present. Aortic valve mean gradient measures 25.0 mmHg. Aortic valve peak gradient measures 45.4 mmHg. Aortic valve area, by VTI measures 0.77 cm. Pulmonic Valve: The pulmonic valve was not well visualized. Pulmonic valve regurgitation is not visualized. No evidence of pulmonic stenosis. Aorta: The aortic root is normal in size and structure. Venous: The inferior vena cava is normal in size with greater than 50% respiratory variability, suggesting right atrial pressure of 3 mmHg. IAS/Shunts: No atrial level shunt detected by color flow Doppler.  LEFT VENTRICLE PLAX 2D LVIDd:         4.30 cm LVIDs:         2.00 cm LV PW:         1.20 cm LV IVS:        1.10 cm LVOT diam:     1.80 cm LV SV:  46 LV SV Index:   29 LVOT Area:     2.54 cm  RIGHT VENTRICLE             IVC RV Basal diam:  2.40 cm     IVC diam: 1.40 cm RV S prime:     13.40 cm/s TAPSE (M-mode): 1.6 cm LEFT ATRIUM              Index        RIGHT ATRIUM           Index LA diam:        4.10 cm  2.54 cm/m   RA Area:     13.80 cm LA Vol (A2C):   108.0 ml 66.97 ml/m  RA Volume:   29.60 ml  18.35 ml/m LA Vol (A4C):   99.1 ml  61.45 ml/m LA Biplane Vol: 105.0 ml 65.11 ml/m  AORTIC VALVE AV Area (Vmax):    0.73 cm AV Area (Vmean):   0.72 cm AV Area (VTI):     0.77 cm AV Vmax:           337.00 cm/s AV Vmean:          226.750 cm/s AV VTI:            0.603 m AV Peak Grad:      45.4 mmHg AV Mean Grad:      25.0 mmHg LVOT Vmax:         96.43  cm/s LVOT Vmean:        64.533 cm/s LVOT VTI:          0.183 m LVOT/AV VTI ratio: 0.30 AI PHT:            341 msec  AORTA Ao Root diam: 3.20 cm MITRAL VALVE MV Area VTI:  1.05 cm    SHUNTS MV Peak grad: 15.6 mmHg   Systemic VTI:  0.18 m MV Mean grad: 7.5 mmHg    Systemic Diam: 1.80 cm MV Vmax:      1.98 m/s MV Vmean:     123.5 cm/s Vishnu Priya Mallipeddi Electronically signed by Winfield Rast Mallipeddi Signature Date/Time: 07/31/2023/6:06:00 PM    Final    US THORACENTESIS ASP PLEURAL SPACE W/IMG GUIDE  Result Date: 07/31/2023 INDICATION: Right pleural effusion EXAM: ULTRASOUND GUIDED RIGHT THORACENTESIS MEDICATIONS: None. COMPLICATIONS: None immediate. PROCEDURE: An ultrasound guided thoracentesis was thoroughly discussed with the patient and questions answered. The benefits, risks, alternatives and complications were also discussed. The patient understands and wishes to proceed with the procedure. Written consent was obtained. Ultrasound was performed to localize and mark an adequate pocket of fluid in the right chest. The area was then prepped and draped in the normal sterile fashion. 1% Lidocaine was used for local anesthesia. Under ultrasound guidance a 8 Fr Safe-T-Centesis catheter was introduced. Thoracentesis was performed. The catheter was removed and a dressing applied. FINDINGS: A total of approximately 1.3 L of straw-colored fluid was removed. Samples were sent to the laboratory as requested by the clinical team. IMPRESSION: Successful ultrasound guided right thoracentesis yielding 1.3 L of pleural fluid. Electronically Signed   By: Acquanetta Belling M.D.   On: 07/31/2023 15:07   US Carotid Bilateral (at [facility] only)  Result Date: 07/31/2023 CLINICAL DATA:  Acute ischemic stroke Hypertension CAD Hyperlipidemia Former tobacco user Diabetes EXAM: BILATERAL CAROTID DUPLEX ULTRASOUND TECHNIQUE: Wallace Cullens scale imaging, color Doppler and duplex ultrasound were performed of bilateral carotid and  vertebral arteries in the neck.  COMPARISON:  None available FINDINGS: Criteria: Quantification of carotid stenosis is based on velocity parameters that correlate the residual internal carotid diameter with NASCET-based stenosis levels, using the diameter of the distal internal carotid lumen as the denominator for stenosis measurement. The following velocity measurements were obtained: RIGHT ICA: 130/18 cm/sec CCA: 63/7 cm/sec SYSTOLIC ICA/CCA RATIO:  2.1 ECA: 96 cm/sec LEFT ICA: 138/25 cm/sec CCA: 71/6 cm/sec SYSTOLIC ICA/CCA RATIO:  78 ECA: 1.9 cm/sec RIGHT CAROTID ARTERY: Moderate long segment calcified plaque of the mid right common carotid artery. Moderate calcified plaque of the right internal carotid artery origin. RIGHT VERTEBRAL ARTERY:  Antegrade flow. LEFT CAROTID ARTERY: Moderate calcified plaque of the left internal carotid artery origin. LEFT VERTEBRAL ARTERY:  Antegrade flow. IMPRESSION: Moderate calcified plaque of the internal carotid artery origins bilaterally with Doppler measurements suggestive of 50-69% stenosis. Electronically Signed   By: Acquanetta Belling M.D.   On: 07/31/2023 15:07   DG Chest 1 View  Result Date: 07/31/2023 CLINICAL DATA:  Status post right thoracentesis EXAM: CHEST  1 VIEW COMPARISON:  07/30/2023 FINDINGS: Unchanged mild cardiomegaly.  No pulmonary vascular congestion. Interval decrease of right pleural effusion. No pneumothorax. Moderate left pleural effusion remains. Masslike opacity in the right infrahilar lung likely relates to atelectasis of the right middle lobe seen better on prior CT. IMPRESSION: No pneumothorax status post right thoracentesis. Electronically Signed   By: Acquanetta Belling M.D.   On: 07/31/2023 15:04   CT Chest W Contrast  Result Date: 07/30/2023 CLINICAL DATA:  Abnormal chest radiograph.  Pulmonary nodule. EXAM: CT CHEST WITH CONTRAST TECHNIQUE: Multidetector CT imaging of the chest was performed during intravenous contrast administration. RADIATION  DOSE REDUCTION: This exam was performed according to the departmental dose-optimization program which includes automated exposure control, adjustment of the mA and/or kV according to patient size and/or use of iterative reconstruction technique. CONTRAST:  75mL OMNIPAQUE IOHEXOL 300 MG/ML  SOLN COMPARISON:  Chest CT dated 8523 and radiograph dated 07/30/2023. FINDINGS: Evaluation of this exam is limited due to respiratory motion. Cardiovascular: There is no cardiomegaly or pericardial effusion. Advanced 3 vessel coronary vascular calcification and calcification of the mitral annulus. Moderate atherosclerotic calcification of the thoracic aorta. No aneurysmal dilatation or dissection. The origins of the great vessels of the aortic arch and the central pulmonary arteries appear patent. Mediastinum/Nodes: No obvious hilar adenopathy. Evaluation however is limited due to consolidative changes of the lungs and pleural effusions. Lymph node anterior to the carina measures 15 mm short axis. The esophagus is grossly unremarkable. No mediastinal fluid collection. Lungs/Pleura: Large right and moderate left pleural effusions. There is partial compressive atelectasis of the lower lobes versus pneumonia. There is near complete collapse of the right middle lobe, new since the prior CT. This is of indeterminate etiology and a centrally occlusive mass is not excluded. Several calcified granuloma noted in the right hilum adjacent to the right middle lobe bronchi. There is no pneumothorax. The central airways remain patent. Upper Abdomen: Cirrhosis.  Dilated upper abdominal collaterals. Musculoskeletal: Osteopenia with degenerative changes. No acute osseous pathology. IMPRESSION: 1. Large right and moderate left pleural effusions. Partial compressive atelectasis of the lower lobes versus pneumonia. 2. Near complete collapse of the right middle lobe, of indeterminate etiology. A centrally occlusive mass is not excluded. Bronchoscopy  may provide better evaluation. 3. Advanced 3 vessel coronary vascular calcification. 4. Cirrhosis. 5.  Aortic Atherosclerosis (ICD10-I70.0). Electronically Signed   By: Elgie Collard M.D.   On: 07/30/2023 20:34   DG  Chest 2 View  Result Date: 07/30/2023 CLINICAL DATA:  Weakness. EXAM: CHEST - 2 VIEW COMPARISON:  Chest radiograph dated 06/03/2022. FINDINGS: Bilateral pleural effusions, right greater than left, with associated bibasilar atelectasis. Pneumonia is not excluded. No pneumothorax. Stable cardiac silhouette. A loop recorder device. Atherosclerotic calcification of the aorta. Degenerative changes of the spine. No acute osseous pathology. IMPRESSION: Bilateral pleural effusions, right greater than left, with associated bibasilar atelectasis. Electronically Signed   By: Elgie Collard M.D.   On: 07/30/2023 19:19   MR BRAIN WO CONTRAST  Result Date: 07/30/2023 CLINICAL DATA:  Initial evaluation for mental status change, unknown cause. EXAM: MRI HEAD WITHOUT CONTRAST MRA HEAD WITHOUT CONTRAST TECHNIQUE: Multiplanar, multi-echo pulse sequences of the brain and surrounding structures were acquired without intravenous contrast. Angiographic images of the Circle of Willis were acquired using MRA technique without intravenous contrast. COMPARISON:  Prior study from 12/06/2021. FINDINGS: MRI HEAD FINDINGS Brain: Age-related cerebral volume loss. Patchy T2/FLAIR hyperintensity involving the periventricular deep white matter both cerebral hemispheres as well as the pons, consistent with chronic small vessel ischemic disease, mild in nature. Encephalomalacia and gliosis involving the right frontal operculum, consistent with a chronic right MCA distribution infarct. Additional small remote left PCA territory infarct involving the left occipital lobe noted. Few scattered remote cerebellar infarcts noted, left greater than right. Few scattered remote lacunar infarcts present about the deep gray nuclei  bilaterally. Approximate 3.4 cm area of restricted diffusion involving the anterior left insula and overlying operculum, consistent with acute left MCA distribution infarct. No significant mass effect or associated hemorrhage. No other evidence for acute or subacute ischemia. No other acute intracranial hemorrhage. No significant chronic intracranial blood products. No mass lesion, midline shift or mass effect. No hydrocephalus or extra-axial fluid collection. Pituitary gland and suprasellar region within normal limits. Vascular: Major intracranial vascular flow voids are maintained. Skull and upper cervical spine: Craniocervical junction within normal limits. Bone marrow signal intensity normal. No scalp soft tissue abnormality. Sinuses/Orbits: Prior bilateral ocular lens replacement. Paranasal sinuses are clear. Large right mastoid effusion noted. Additional trace left mastoid effusion. Visualized nasopharynx unremarkable. Other: None. MRA HEAD FINDINGS Anterior circulation: Examination mildly degraded by motion artifact. Both internal carotid arteries are patent through the siphons without significant stenosis or other abnormality. A1 segments, anterior communicating artery complex common anterior cerebral arteries patent without stenosis. No M1 stenosis or occlusion. Right MCA branches patent and well perfused. On the left, there is a probable acute left M3 occlusion (series 5, image 177), corresponding with the evolving left MCA distribution infarct. Remainder of the left MCA branches remain patent and perfused. Posterior circulation: Both V4 segments patent without stenosis. Both PICA patent at their origins. Basilar widely patent without stenosis. Superior cerebellar arteries patent bilaterally. Both PCAs primarily supplied via the basilar. Left PCA patent to its distal aspect without significant stenosis. Focal severe distal right P2 stenosis (series 1032, image 13). Right PCA otherwise patent as well.  Anatomic variants: None significant.  No intracranial aneurysm. IMPRESSION: MRI HEAD: 1. 3.4 cm acute involving the anterior left insula and overlying operculum, left MCA distribution. No associated hemorrhage or significant mass effect. 2. Underlying age-related cerebral volume loss with chronic microvascular ischemic disease, with multiple remote infarcts as above. MRA HEAD: 1. Probable acute left M3 occlusion, corresponding with the evolving left MCA distribution infarct. 2. Focal severe distal right P2 stenosis. 3. Otherwise wide patency of the major intracranial arterial vasculature. Electronically Signed   By: Janell Quiet.D.  On: 07/30/2023 18:44   MR ANGIO HEAD WO CONTRAST  Result Date: 07/30/2023 CLINICAL DATA:  Initial evaluation for mental status change, unknown cause. EXAM: MRI HEAD WITHOUT CONTRAST MRA HEAD WITHOUT CONTRAST TECHNIQUE: Multiplanar, multi-echo pulse sequences of the brain and surrounding structures were acquired without intravenous contrast. Angiographic images of the Circle of Willis were acquired using MRA technique without intravenous contrast. COMPARISON:  Prior study from 12/06/2021. FINDINGS: MRI HEAD FINDINGS Brain: Age-related cerebral volume loss. Patchy T2/FLAIR hyperintensity involving the periventricular deep white matter both cerebral hemispheres as well as the pons, consistent with chronic small vessel ischemic disease, mild in nature. Encephalomalacia and gliosis involving the right frontal operculum, consistent with a chronic right MCA distribution infarct. Additional small remote left PCA territory infarct involving the left occipital lobe noted. Few scattered remote cerebellar infarcts noted, left greater than right. Few scattered remote lacunar infarcts present about the deep gray nuclei bilaterally. Approximate 3.4 cm area of restricted diffusion involving the anterior left insula and overlying operculum, consistent with acute left MCA distribution  infarct. No significant mass effect or associated hemorrhage. No other evidence for acute or subacute ischemia. No other acute intracranial hemorrhage. No significant chronic intracranial blood products. No mass lesion, midline shift or mass effect. No hydrocephalus or extra-axial fluid collection. Pituitary gland and suprasellar region within normal limits. Vascular: Major intracranial vascular flow voids are maintained. Skull and upper cervical spine: Craniocervical junction within normal limits. Bone marrow signal intensity normal. No scalp soft tissue abnormality. Sinuses/Orbits: Prior bilateral ocular lens replacement. Paranasal sinuses are clear. Large right mastoid effusion noted. Additional trace left mastoid effusion. Visualized nasopharynx unremarkable. Other: None. MRA HEAD FINDINGS Anterior circulation: Examination mildly degraded by motion artifact. Both internal carotid arteries are patent through the siphons without significant stenosis or other abnormality. A1 segments, anterior communicating artery complex common anterior cerebral arteries patent without stenosis. No M1 stenosis or occlusion. Right MCA branches patent and well perfused. On the left, there is a probable acute left M3 occlusion (series 5, image 177), corresponding with the evolving left MCA distribution infarct. Remainder of the left MCA branches remain patent and perfused. Posterior circulation: Both V4 segments patent without stenosis. Both PICA patent at their origins. Basilar widely patent without stenosis. Superior cerebellar arteries patent bilaterally. Both PCAs primarily supplied via the basilar. Left PCA patent to its distal aspect without significant stenosis. Focal severe distal right P2 stenosis (series 1032, image 13). Right PCA otherwise patent as well. Anatomic variants: None significant.  No intracranial aneurysm. IMPRESSION: MRI HEAD: 1. 3.4 cm acute involving the anterior left insula and overlying operculum, left MCA  distribution. No associated hemorrhage or significant mass effect. 2. Underlying age-related cerebral volume loss with chronic microvascular ischemic disease, with multiple remote infarcts as above. MRA HEAD: 1. Probable acute left M3 occlusion, corresponding with the evolving left MCA distribution infarct. 2. Focal severe distal right P2 stenosis. 3. Otherwise wide patency of the major intracranial arterial vasculature. Electronically Signed   By: Rise Mu M.D.   On: 07/30/2023 18:44   CUP PACEART REMOTE DEVICE CHECK  Result Date: 07/06/2023 ILR summary report received. Battery status shows RRT since 06/17/2023, previously sent to triage. Normal device function. There were no new symptom or brady episodes. There were 13 new AF episodes. AF burden is 93.1% of the time.   On OAC, good ventricular rate control.  There was one 3 second pause event detected at 0756.  There was one tachy episode detected that 176 bpm and  lasted 42 seconds.  Sent to triage.  Monthly summary reports and ROV/PRN ML, CVRS    Subjective: Pt reports no specific complaints but intermittently confused.   Discharge Exam: Vitals:   08/01/23 0427 08/01/23 1037  BP: 128/60 (!) 147/83  Pulse: 97 (!) 107  Resp: 18 20  Temp: 97.8 F (36.6 C) 98.5 F (36.9 C)  SpO2: 95% 100%   Vitals:   07/31/23 2047 07/31/23 2055 08/01/23 0427 08/01/23 1037  BP: (!) 144/64  128/60 (!) 147/83  Pulse: 81  97 (!) 107  Resp: 20  18 20   Temp:  98.7 F (37.1 C) 97.8 F (36.6 C) 98.5 F (36.9 C)  TempSrc:  Oral  Oral  SpO2: 97%  95% 100%  Weight:      Height:       General exam: Appears calm and comfortable  Respiratory system: Clear to auscultation. Respiratory effort normal. Cardiovascular system: normal S1 & S2 heard. No JVD, murmurs, rubs, gallops or clicks. No pedal edema. Gastrointestinal system: Abdomen is nondistended, soft and nontender. No organomegaly or masses felt. Normal bowel sounds heard. Central nervous  system: Alert and oriented to person and place. LLE 4/5 strength, RLE, LUE, LLE 5/5 strength.  Extremities: Symmetric 5 x 5 power. Skin: No rashes, lesions or ulcers. Psychiatry: Judgement and insight appear normal. Mood & affect appropriate   The results of significant diagnostics from this hospitalization (including imaging, microbiology, ancillary and laboratory) are listed below for reference.     Microbiology: Recent Results (from the past 240 hour(s))  Blood culture (routine x 2)     Status: None (Preliminary result)   Collection Time: 07/30/23  7:11 PM   Specimen: BLOOD  Result Value Ref Range Status   Specimen Description BLOOD BLOOD LEFT FOREARM  Final   Special Requests   Final    BOTTLES DRAWN AEROBIC AND ANAEROBIC Blood Culture results may not be optimal due to an excessive volume of blood received in culture bottles   Culture   Final    NO GROWTH 2 DAYS Performed at [facility], [address], [place], [place] [address]    Report Status PENDING  Incomplete  Blood culture (routine x 2)     Status: None (Preliminary result)   Collection Time: 07/30/23  7:11 PM   Specimen: BLOOD  Result Value Ref Range Status   Specimen Description BLOOD BLOOD RIGHT FOREARM  Final   Special Requests   Final    BOTTLES DRAWN AEROBIC AND ANAEROBIC Blood Culture results may not be optimal due to an excessive volume of blood received in culture bottles   Culture   Final    NO GROWTH 2 DAYS Performed at [facility], [address], [place], [place] [address]    Report Status PENDING  Incomplete  Gram stain     Status: None   Collection Time: 07/31/23  2:15 PM   Specimen: Pleura  Result Value Ref Range Status   Specimen Description PLEURAL  Final   Special Requests NONE  Final   Gram Stain   Final    NO ORGANISMS SEEN WBC PRESENT,BOTH PMN AND MONONUCLEAR CYTOSPIN SMEAR Performed at [facility], [address], [place], [place] [address]    Report Status 07/31/2023 FINAL  Final   Culture, body fluid w Gram Stain-bottle     Status: None (Preliminary result)   Collection Time: 07/31/23  2:15 PM   Specimen: Pleura  Result Value Ref Range Status   Specimen Description PLEURAL RIGHT  Final   Special Requests NONE  Final   Culture   Final    NO GROWTH < 24 HOURS Performed at [facility], [address] [place], [place] [address]    Report Status PENDING  Incomplete     Labs: BNP (last 3 results) No results for input(s): "BNP" in the last 8760 hours. Basic Metabolic Panel: Recent Labs  Lab 07/30/23 1450 07/31/23 0439 08/01/23 0421  NA 138 139 142  K 3.1* 2.6* 2.8*  CL 103 108 106  CO2 23 24 25   GLUCOSE 157* 99 99  BUN 14 12 9   CREATININE 1.09* 0.82 0.78  CALCIUM 8.7* 8.1* 7.6*  MG  --  1.7 1.7   Liver Function Tests: Recent Labs  Lab 07/30/23 1450 07/31/23 0439 08/01/23 0421  AST 38 27  --   ALT 21 17  --   ALKPHOS 121 86  --   BILITOT 3.8* 2.2*  --   PROT 6.2* 4.7*  --   ALBUMIN 2.9* 2.2* 2.3*   No results for input(s): "LIPASE", "AMYLASE" in the last 168 hours. No results for input(s): "AMMONIA" in the last 168 hours. CBC: Recent Labs  Lab 07/30/23 1450 07/31/23 0439  WBC 6.4 4.8  HGB 13.3 10.8*  HCT 39.8 32.0*  MCV 95.0 93.8  PLT 137* 100*   Cardiac Enzymes: No results for input(s): "CKTOTAL", "CKMB", "CKMBINDEX", "TROPONINI" in the last 168 hours. BNP: Invalid input(s): "POCBNP" CBG: Recent Labs  Lab 07/30/23 1414  GLUCAP 127*   D-Dimer No results for input(s): "DDIMER" in the last 72 hours. Hgb A1c Recent Labs    07/30/23 1450  HGBA1C 5.3   Lipid Profile Recent Labs    07/31/23 0439  CHOL 119  HDL 32*  LDLCALC 77  TRIG 50  CHOLHDL 3.7   Thyroid function studies No results for input(s): "TSH", "T4TOTAL", "T3FREE", "THYROIDAB" in the last 72 hours.  Invalid input(s): "FREET3" Anemia work up No results for input(s): "VITAMINB12", "FOLATE", "FERRITIN", "TIBC", "IRON", "RETICCTPCT" in the last 72  hours. Urinalysis    Component Value Date/Time   COLORURINE YELLOW 04/12/2022 1240   APPEARANCEUR CLEAR 04/12/2022 1240   LABSPEC 1.015 04/12/2022 1240   PHURINE 6.0 04/12/2022 1240   GLUCOSEU NEGATIVE 04/12/2022 1240   HGBUR LARGE (A) 04/12/2022 1240   BILIRUBINUR NEGATIVE 04/12/2022 1240   KETONESUR NEGATIVE 04/12/2022 1240   PROTEINUR 100 (A) 04/12/2022 1240   UROBILINOGEN 0.2 07/09/2015 0930   NITRITE NEGATIVE 04/12/2022 1240   LEUKOCYTESUR NEGATIVE 04/12/2022 1240   Sepsis Labs Recent Labs  Lab 07/30/23 1450 07/31/23 0439  WBC 6.4 4.8   Microbiology Recent Results (from the past 240 hour(s))  Blood culture (routine x 2)     Status: None (Preliminary result)   Collection Time: 07/30/23  7:11 PM   Specimen: BLOOD  Result Value Ref Range Status   Specimen Description BLOOD BLOOD LEFT FOREARM  Final   Special Requests   Final    BOTTLES DRAWN AEROBIC AND ANAEROBIC Blood Culture results may not be optimal due to an excessive volume of blood received in culture bottles   Culture   Final    NO GROWTH 2 DAYS Performed at [facility], [address], [place], [place] [address]    Report Status PENDING  Incomplete  Blood culture (routine x 2)     Status: None (Preliminary result)   Collection Time: 07/30/23  7:11 PM   Specimen: BLOOD  Result Value Ref Range Status   Specimen Description  BLOOD BLOOD RIGHT FOREARM  Final   Special Requests   Final    BOTTLES DRAWN AEROBIC AND ANAEROBIC Blood Culture results may not be optimal due to an excessive volume of blood received in culture bottles   Culture   Final    NO GROWTH 2 DAYS Performed at [facility], [address], [place], [place] [address]    Report Status PENDING  Incomplete  Gram stain     Status: None   Collection Time: 07/31/23  2:15 PM   Specimen: Pleura  Result Value Ref Range Status   Specimen Description PLEURAL  Final   Special Requests NONE  Final   Gram Stain   Final    NO ORGANISMS SEEN WBC  PRESENT,BOTH PMN AND MONONUCLEAR CYTOSPIN SMEAR Performed at [facility], [address] [place], [place] [address]    Report Status 07/31/2023 FINAL  Final  Culture, body fluid w Gram Stain-bottle     Status: None (Preliminary result)   Collection Time: 07/31/23  2:15 PM   Specimen: Pleura  Result Value Ref Range Status   Specimen Description PLEURAL RIGHT  Final   Special Requests NONE  Final   Culture   Final    NO GROWTH < 24 HOURS Performed at [facility], [address] [place], [place] [address]    Report Status PENDING  Incomplete   Time coordinating discharge: 44 mins  SIGNED:  Standley Dakins, MD  [facility] 08/01/2023, 12:30 PM How to contact the [facility] Attending or Consulting provider 7A - 7P or covering provider during after hours 7P -7A, for this patient?  Check the care team in [facility] and look for a) attending/consulting [facility] provider listed and b) the [facility] team listed Log into [email or web address] and use [place]'s universal password to access. If you do not have the password, please contact the hospital operator. Locate the [facility] provider you are looking for under [facility] and page to a number that you can be directly reached. If you still have difficulty reaching the provider, please page the [facility] (Director on Call) for the Hospitalists listed on amion for assistance.

## 2023-08-01 NOTE — Discharge Instructions (Signed)
IMPORTANT INFORMATION: PAY CLOSE ATTENTION  ? ?PHYSICIAN DISCHARGE INSTRUCTIONS ? ?Follow with Primary care provider  Elfredia Nevins, MD  and other consultants as instructed by your Hospitalist Physician ? ?SEEK MEDICAL CARE OR RETURN TO EMERGENCY ROOM IF SYMPTOMS COME BACK, WORSEN OR NEW PROBLEM DEVELOPS  ? ?Please note: ?You were cared for by a hospitalist during your hospital stay. Every effort will be made to forward records to your primary care provider.  You can request that your primary care provider send for your hospital records if they have not received them.  Once you are discharged, your primary care physician will handle any further medical issues. Please note that NO REFILLS for any discharge medications will be authorized once you are discharged, as it is imperative that you return to your primary care physician (or establish a relationship with a primary care physician if you do not have one) for your post hospital discharge needs so that they can reassess your need for medications and monitor your lab values. ? ?Please get a complete blood count and chemistry panel checked by your Primary MD at your next visit, and again as instructed by your Primary MD. ? ?Get Medicines reviewed and adjusted: ?Please take all your medications with you for your next visit with your Primary MD ? ?Laboratory/radiological data: ?Please request your Primary MD to go over all hospital tests and procedure/radiological results at the follow up, please ask your primary care provider to get all Hospital records sent to his/her office. ? ?In some cases, they will be blood work, cultures and biopsy results pending at the time of your discharge. Please request that your primary care provider follow up on these results. ? ?If you are diabetic, please bring your blood sugar readings with you to your follow up appointment with primary care.   ? ?Please call and make your follow up appointments as soon as possible.   ? ?Also Note  the following: ?If you experience worsening of your admission symptoms, develop shortness of breath, life threatening emergency, suicidal or homicidal thoughts you must seek medical attention immediately by calling 911 or calling your MD immediately  if symptoms less severe. ? ?You must read complete instructions/literature along with all the possible adverse reactions/side effects for all the Medicines you take and that have been prescribed to you. Take any new Medicines after you have completely understood and accpet all the possible adverse reactions/side effects.  ? ?Do not drive when taking Pain medications or sleeping medications (Benzodiazepines) ? ?Do not take more than prescribed Pain, Sleep and Anxiety Medications. It is not advisable to combine anxiety,sleep and pain medications without talking with your primary care practitioner ? ?Special Instructions: If you have smoked or chewed Tobacco  in the last 2 yrs please stop smoking, stop any regular Alcohol  and or any Recreational drug use. ? ?Wear Seat belts while driving.  Do not drive if taking any narcotic, mind altering or controlled substances or recreational drugs or alcohol.  ? ? ? ? ? ?

## 2023-08-01 NOTE — Progress Notes (Signed)
Physical Therapy Treatment Patient Details Name: [name] MRN: [id number] DOB: [birth year] Today's Date: 08/01/2023   History of Present Illness [name] is a 82 y.o. female with medical history significant for atrial fibrillation on Eliquis, CAD, aortic stenosis, cirrhosis, history of CVA, and hepatocellular carcinoma status post radioembolization who now presents with change in mental status.      Patient reportedly went to bed in her usual state last night at approximately 11 PM but was noted upon waking today to be moving very slowly.  She would make eye contact, but was not responding verbally to family.  This lasted approximately 2 hours before she began speaking again.  She has not been complaining of anything.  She has not appeared to be short of breath.    PT Comments  Pt tolerating today's treatment session, well today with moderate fatigue after session and returned back to original supine position with bed alarm on. Today's session addressed functional transfers to/from chair to bed and postural training. Pt noted with significant posterior lean in standing more than sitting. Max facilitation given for center of mass shift with orientation to appropriate standing posture. Pt continues with minimal to moderate assistance for balance when performing transfers but showed great carryover with multiple sit/stand trials from EOB and BSC. Pt incontinent of bowel and bladder with mobility this session. Hygiene performed and placed back in bed. Progressing well with increased activity tolerance and improved bed mobility Pt would continue to benefit from skilled acute physical therapy services in order to progress toward POC goals, safety/independence with functional mobility and QOL.     If plan is discharge home, recommend the following: A little help with walking and/or transfers;A little help with bathing/dressing/bathroom;Assistance with cooking/housework   Can travel by private  vehicle     Yes  Equipment Recommendations  None recommended by PT    Recommendations for Other Services       Precautions / Restrictions Precautions Precautions: Fall Restrictions Weight Bearing Restrictions: No     Mobility  Bed Mobility Overal bed mobility: Needs Assistance Bed Mobility: Supine to Sit, Sit to Supine     Supine to sit: Modified independent (Device/Increase time) Sit to supine: Supervision, Mod assist   General bed mobility comments: modified independent for supine to sit with HOB elevated and use of bedrail with tactile cues given. Slow labored movement. Sit to supine with increased labor and fatigue, mod assist for return to supine with BLE assist. Pt independent for sliding superior to [facility] for positioning. Patient Response: Cooperative  Transfers Overall transfer level: Needs assistance Equipment used: Rolling walker (2 wheels) Transfers: Sit to/from Stand, Bed to chair/wheelchair/BSC Sit to Stand: Min assist Stand pivot transfers: Min assist         General transfer comment: slow movement, min assist for multiple sit/stands from EOB and BSC. min assist for stand pivot transfer EOB to/from [facility] and back to EOB. Verbal and tactile cues given with hand over hand directions for hand placement when powering up to stand from [facility]. Pt dependent for peri-hygiene with loss BM and urine in BSC. Improved sit/stands from [facility] with multiple trials.    Ambulation/Gait Ambulation/Gait assistance: Mod assist Gait Distance (Feet): 3 Feet Assistive device: Rolling walker (2 wheels) Gait Pattern/deviations: Decreased step length - left, Decreased step length - right, Step-to pattern       General Gait Details: left foot inverted. side stepping for 3 ft at EOB, mod assit for balance with RW management facilitating  weight shifts laterally.   Stairs             Wheelchair Mobility     Tilt Bed Tilt Bed Patient Response: Cooperative  Modified Rankin  (Stroke Patients Only)       Balance Overall balance assessment: Needs assistance Sitting-balance support: Bilateral upper extremity supported, Feet unsupported Sitting balance-Leahy Scale: Fair Sitting balance - Comments: seatd at EOB; mild lean posteriorly at times Postural control: Posterior lean Standing balance support: Bilateral upper extremity supported, During functional activity, Reliant on assistive device for balance Standing balance-Leahy Scale: Fair Standing balance comment: fair with 2 HHA wit therapist and RW. Pt with significant posterior lean in standing.               High Level Balance Comments: performing lateral and a/p weight shifts in standing w/ 2 HHA from therapist. Max facilitation given as pt with increased fear of movement.            Cognition Arousal: Alert Behavior During Therapy: WFL for tasks assessed/performed Overall Cognitive Status: Impaired/Different from baseline Area of Impairment: Orientation, Attention                 Orientation Level: Person, Place Current Attention Level: Sustained           General Comments: Pt axo to person and place today. Able to orient to time with selection of month and year        Exercises      General Comments General comments (skin integrity, edema, etc.): Redness and swelling BLE noted. Not painful.      Pertinent Vitals/Pain Pain Assessment Pain Assessment: No/denies pain    Home Living                          Prior Function            PT Goals (current goals can now be found in the care plan section) Acute Rehab PT Goals Patient Stated Goal: return home PT Goal Formulation: With patient Time For Goal Achievement: 08/14/23 Potential to Achieve Goals: Good Progress towards PT goals: Progressing toward goals    Frequency    Min 2X/week      PT Plan      Co-evaluation              AM-PAC PT "6 Clicks" Mobility   Outcome Measure  Help needed  turning from your back to your side while in a flat bed without using bedrails?: A Little Help needed moving from lying on your back to sitting on the side of a flat bed without using bedrails?: A Little Help needed moving to and from a bed to a chair (including a wheelchair)?: A Little Help needed standing up from a chair using your arms (e.g., wheelchair or bedside chair)?: A Little Help needed to walk in hospital room?: A Lot Help needed climbing 3-5 steps with a railing? : Total 6 Click Score: 15    End of Session Equipment Utilized During Treatment: Gait belt Activity Tolerance: Patient tolerated treatment well Patient left: in bed;with bed alarm set;with call bell/phone within reach Nurse Communication: Mobility status PT Visit Diagnosis: Other abnormalities of gait and mobility (R26.89);Muscle weakness (generalized) (M62.81)     Time: 4098-1191 PT Time Calculation (min) (ACUTE ONLY): 27 min  Charges:    $Therapeutic Activity: 8-22 mins $Neuromuscular Re-education: 8-22 mins PT General Charges $$ ACUTE PT VISIT: 1 Visit  Nelida Meuse PT, DPT Physical Therapist with Tomasa Hosteller [facility] 336 [phone] office   Nelida Meuse 08/01/2023, 9:22 AM

## 2023-08-01 NOTE — TOC Transition Note (Signed)
Transition of Care [facility]) - CM/SW Discharge Note   Patient Details  Name: [name] MRN: [id number] Date of Birth: 12/12/1940  Transition of Care [facility]) CM/SW Contact:  Catalina Gravel, LCSW Phone Number: 08/01/2023, 1:05 PM   Clinical Narrative:     CSW contacted [facility] and gathered RR information. [place] to nursing staff with RR info then contact to family advising that pt will DC today and transport facilitated to [facility].  Grand daughter please agreed to visit with pt once arrived to new location today.  No further TOC needs.     Barriers to Discharge: No Barriers Identified   Patient Goals and CMS Choice CMS Medicare.gov Compare Post Acute Care list provided to:: Patient Represenative (must comment) Choice offered to / list presented to : Adult Children  Discharge Placement                         Discharge Plan and Services Additional resources added to the After Visit Summary for   In-house Referral: Clinical Social Work                                   Social Determinants of Health (SDOH) Interventions SDOH Screenings   Food Insecurity: No Food Insecurity (07/31/2023)  Housing: Patient Declined (07/31/2023)  Transportation Needs: No Transportation Needs (07/31/2023)  Utilities: Patient Declined (07/30/2023)  Alcohol Screen: Low Risk  (03/16/2019)  Depression (PHQ2-9): Low Risk  (09/26/2021)  Financial Resource Strain: Low Risk  (09/15/2022)  Physical Activity: Inactive (03/16/2019)  Social Connections: Moderately Isolated (03/16/2019)  Stress: No Stress Concern Present (03/16/2019)  Tobacco Use: Medium Risk (07/31/2023)     Readmission Risk Interventions    04/14/2022    3:34 PM 04/13/2022   10:50 AM  Readmission Risk Prevention Plan  Transportation Screening Complete Complete  PCP or Specialist Appt within 5-7 Days Not Complete   Home Care Screening Complete   Medication Review (RN CM) Complete   HRI or Home Care Consult  Complete  Social Work  Consult for Recovery Care Planning/Counseling  Complete  Palliative Care Screening  Not Applicable  Medication Review Oceanographer)  Complete

## 2023-08-01 NOTE — Progress Notes (Signed)
Pt has DC order, pt will be transferred to [facility]. Family was informed per [place]. AVS was printed out and kept in the DC pocket.

## 2023-08-03 ENCOUNTER — Other Ambulatory Visit (HOSPITAL_COMMUNITY)
Admission: RE | Admit: 2023-08-03 | Discharge: 2023-08-03 | Disposition: A | Discharge status: Home / Self Care | Payer: Medicare Other | Source: Skilled Nursing Facility | Attending: Adult Health | Admitting: Adult Health

## 2023-08-03 ENCOUNTER — Encounter: Payer: Self-pay | Admitting: Adult Health

## 2023-08-03 ENCOUNTER — Non-Acute Institutional Stay (SKILLED_NURSING_FACILITY): Payer: Medicare Other | Admitting: Adult Health

## 2023-08-03 DIAGNOSIS — J90 Pleural effusion, not elsewhere classified: Secondary | ICD-10-CM | POA: Diagnosis not present

## 2023-08-03 DIAGNOSIS — I129 Hypertensive chronic kidney disease with stage 1 through stage 4 chronic kidney disease, or unspecified chronic kidney disease: Secondary | ICD-10-CM | POA: Insufficient documentation

## 2023-08-03 DIAGNOSIS — G8929 Other chronic pain: Secondary | ICD-10-CM | POA: Insufficient documentation

## 2023-08-03 DIAGNOSIS — K7460 Unspecified cirrhosis of liver: Secondary | ICD-10-CM | POA: Diagnosis not present

## 2023-08-03 DIAGNOSIS — I700 Atherosclerosis of aorta: Secondary | ICD-10-CM | POA: Insufficient documentation

## 2023-08-03 DIAGNOSIS — E1169 Type 2 diabetes mellitus with other specified complication: Secondary | ICD-10-CM | POA: Diagnosis not present

## 2023-08-03 DIAGNOSIS — Z8673 Personal history of transient ischemic attack (TIA), and cerebral infarction without residual deficits: Secondary | ICD-10-CM | POA: Insufficient documentation

## 2023-08-03 DIAGNOSIS — I5032 Chronic diastolic (congestive) heart failure: Secondary | ICD-10-CM

## 2023-08-03 DIAGNOSIS — F339 Major depressive disorder, recurrent, unspecified: Secondary | ICD-10-CM | POA: Insufficient documentation

## 2023-08-03 DIAGNOSIS — D696 Thrombocytopenia, unspecified: Secondary | ICD-10-CM

## 2023-08-03 DIAGNOSIS — E876 Hypokalemia: Secondary | ICD-10-CM | POA: Diagnosis not present

## 2023-08-03 DIAGNOSIS — E1122 Type 2 diabetes mellitus with diabetic chronic kidney disease: Secondary | ICD-10-CM

## 2023-08-03 DIAGNOSIS — K2100 Gastro-esophageal reflux disease with esophagitis, without bleeding: Secondary | ICD-10-CM

## 2023-08-03 DIAGNOSIS — C220 Liver cell carcinoma: Secondary | ICD-10-CM | POA: Diagnosis not present

## 2023-08-03 DIAGNOSIS — I4819 Other persistent atrial fibrillation: Secondary | ICD-10-CM | POA: Diagnosis not present

## 2023-08-03 DIAGNOSIS — E43 Unspecified severe protein-calorie malnutrition: Secondary | ICD-10-CM | POA: Insufficient documentation

## 2023-08-03 DIAGNOSIS — N183 Chronic kidney disease, stage 3 unspecified: Secondary | ICD-10-CM

## 2023-08-03 DIAGNOSIS — E785 Hyperlipidemia, unspecified: Secondary | ICD-10-CM

## 2023-08-03 DIAGNOSIS — N1831 Chronic kidney disease, stage 3a: Secondary | ICD-10-CM

## 2023-08-03 LAB — CBC WITH DIFFERENTIAL/PLATELET
Abs Immature Granulocytes: 0.02 10*3/uL (ref 0.00–0.07)
Basophils Absolute: 0 10*3/uL (ref 0.0–0.1)
Basophils Relative: 1 %
Eosinophils Absolute: 0.4 10*3/uL (ref 0.0–0.5)
Eosinophils Relative: 6 %
HCT: 36 % (ref 36.0–46.0)
Hemoglobin: 11.7 g/dL — ABNORMAL LOW (ref 12.0–15.0)
Immature Granulocytes: 0 %
Lymphocytes Relative: 22 %
Lymphs Abs: 1.4 10*3/uL (ref 0.7–4.0)
MCH: 30.9 pg (ref 26.0–34.0)
MCHC: 32.5 g/dL (ref 30.0–36.0)
MCV: 95 fL (ref 80.0–100.0)
Monocytes Absolute: 0.6 10*3/uL (ref 0.1–1.0)
Monocytes Relative: 9 %
Neutro Abs: 4.2 10*3/uL (ref 1.7–7.7)
Neutrophils Relative %: 62 %
Platelets: 106 10*3/uL — ABNORMAL LOW (ref 150–400)
RBC: 3.79 MIL/uL — ABNORMAL LOW (ref 3.87–5.11)
RDW: 14.5 % (ref 11.5–15.5)
WBC: 6.6 10*3/uL (ref 4.0–10.5)
nRBC: 0 % (ref 0.0–0.2)

## 2023-08-03 LAB — BASIC METABOLIC PANEL
Anion gap: 6 (ref 5–15)
BUN: 12 mg/dL (ref 8–23)
CO2: 26 mmol/L (ref 22–32)
Calcium: 7.7 mg/dL — ABNORMAL LOW (ref 8.9–10.3)
Chloride: 104 mmol/L (ref 98–111)
Creatinine, Ser: 1.1 mg/dL — ABNORMAL HIGH (ref 0.44–1.00)
GFR, Estimated: 51 mL/min — ABNORMAL LOW (ref >60)
Glucose, Bld: 86 mg/dL (ref 70–99)
Potassium: 3.3 mmol/L — ABNORMAL LOW (ref 3.5–5.1)
Sodium: 136 mmol/L (ref 135–145)

## 2023-08-03 LAB — AMMONIA: Ammonia: 49 umol/L — ABNORMAL HIGH (ref 9–35)

## 2023-08-03 NOTE — Progress Notes (Signed)
Location:  [facility] Room Number: 128 Place of Service:  SNF (31)   CODE STATUS: full   Allergies  Allergen Reactions   Tape Rash and Other (See Comments)    Old paper tape caused a reaction, doesn't react to most other tapes     Chief Complaint  Patient presents with   Hospitalization Follow-up    HPI:  She is a 82 year old woman who has been hospitalized from 07-30-23 through 08-01-23. Her past medical history includes: atrial fibrillation; CAD: cirrhosis; history of cva; hepatocellular carcinoma s/p radio embolization. She presented to the ED with AMS. She had gone to bed in her usual state of health the night prior. In the AM was moving very slowly. She made eye contact but not responding verbalizing. This lasted about 2 hours before she started speaking again. Her chest x-ray was notable for bilateral pleural effusions. She did have a neuro consult.   acute ischemic CVA: likely cardio embolic: Dr. Melynda Ripple consulted by telemedicine. She has moderate calcified of the internal carotid artery origins bilaterally 50-69% stenosis. She was changed to pradaxa started on 07-31-23; continue asa 81 mg daily and lipitor 80 mg daily   Bilateral pleural effusions R>L: underwent a right thoracentesis with 1.3 L return.  History of hepatocellular carcinoma and liver cirrhosis: she has resumed her lactulose and lasix. She is followed by Dr. Ellin Saba   She is here for short term rehab with her goal to return back home. She will continue to be followed for her chronic illnesses including:  Aortic atherosclerosis    Chronic diastolic HF (heart failure): . Persistent atrial fibrillation:    Cirrhosis of liver without ascites unspecific cirrhosis type/hepatocellular carcinoma:   Past Medical History:  Diagnosis Date   Anemia    Anxiety    Aortic insufficiency    Moderate   Atrial fibrillation (HCC)    Back pain    Carotid stenosis, bilateral    Cirrhosis (HCC)    Closed  left femoral fracture (HCC) 04/21/2017   Coronary artery disease    Stent x 2 RCA 1995, cardiac catheterization 09/2016 showing only mild atherosclerosis   DDD (degenerative disc disease), lumbar    Depression    Diabetes mellitus without complication (HCC)    Essential hypertension    Falls    GERD (gastroesophageal reflux disease)    Hiatal hernia    History of stroke    Hypercholesteremia    IBS (irritable bowel syndrome)    Non-alcoholic fatty liver disease    OSA (obstructive sleep apnea)    no CPAP   Pericardial effusion    a. small by echo 2018.   PSVT (paroxysmal supraventricular tachycardia) (HCC)    Recurrent UTI    Seizures (HCC) 2018   Stroke (HCC) 07/2019   several strokes within the last 10 years   Swallowing difficulty    Varices, esophageal (HCC)     Past Surgical History:  Procedure Laterality Date   ANAL FISSURE REPAIR  1972   APPENDECTOMY     BACK SURGERY  1985   CARDIAC CATHETERIZATION  [phone]   Stent to the proximal RCA after MI    CARDIAC CATHETERIZATION N/A 09/26/2016   Procedure: Left Heart Cath and Coronary Angiography;  Surgeon: Marykay Lex, MD;  Location: [facility] INVASIVE CV LAB;  Service: Cardiovascular;  Laterality: N/A;   CATARACT EXTRACTION W/PHACO Right 07/04/2014   Procedure: CATARACT EXTRACTION PHACO AND INTRAOCULAR LENS PLACEMENT (IOC);  Surgeon: Loraine Leriche  Carney Living, MD;  Location: [facility] ORS;  Service: Ophthalmology;  Laterality: Right;  CDE:13.13   COLONOSCOPY     DILATION AND CURETTAGE OF UTERUS     x2   Epi Retinal Membrane Peel Left    ERCP     ESOPHAGEAL BANDING N/A 04/01/2013   Procedure: ESOPHAGEAL BANDING;  Surgeon: Malissa Hippo, MD;  Location: [facility] ENDO SUITE;  Service: Endoscopy;  Laterality: N/A;   ESOPHAGEAL BANDING N/A 05/24/2013   Procedure: ESOPHAGEAL BANDING;  Surgeon: Malissa Hippo, MD;  Location: [facility];  Service: Endoscopy;  Laterality: N/A;   ESOPHAGEAL BANDING N/A 06/21/2014   Procedure: ESOPHAGEAL BANDING;   Surgeon: Malissa Hippo, MD;  [facility]: [facility];  Service: Endoscopy;  Laterality: N/A;   ESOPHAGEAL BANDING  06/15/2020   Procedure: ESOPHAGEAL BANDING;  Surgeon: Dolores Frame, MD;  [facility]: [facility];  Service: Gastroenterology;;   ESOPHAGOGASTRODUODENOSCOPY N/A 04/01/2013   Procedure: ESOPHAGOGASTRODUODENOSCOPY (EGD);  Surgeon: Malissa Hippo, MD;  Location: [facility];  Service: Endoscopy;  Laterality: N/A;  230-rescheduled to 8:30am Ann notified pt   ESOPHAGOGASTRODUODENOSCOPY N/A 05/24/2013   Procedure: ESOPHAGOGASTRODUODENOSCOPY (EGD);  Surgeon: Malissa Hippo, MD;  [facility]: [facility];  Service: Endoscopy;  Laterality: N/A;  730   ESOPHAGOGASTRODUODENOSCOPY N/A 06/21/2014   Procedure: ESOPHAGOGASTRODUODENOSCOPY (EGD);  Surgeon: Malissa Hippo, MD;  [facility];  Service: Endoscopy;  Laterality: N/A;  930-rescheduled 10/14 @ 1200 Ann to notify pt   ESOPHAGOGASTRODUODENOSCOPY (EGD) WITH PROPOFOL N/A 06/15/2020   Procedure: ESOPHAGOGASTRODUODENOSCOPY (EGD) WITH PROPOFOL;  Surgeon: Dolores Frame, MD;  [facility];  Service: Gastroenterology;  Laterality: N/A;  230   ESOPHAGOGASTRODUODENOSCOPY (EGD) WITH PROPOFOL N/A 07/27/2020   Procedure: ESOPHAGOGASTRODUODENOSCOPY (EGD) WITH PROPOFOL;  Surgeon: Dolores Frame, MD;  [facility];  Service: Gastroenterology;  Laterality: N/A;  1245   ESOPHAGOGASTRODUODENOSCOPY (EGD) WITH PROPOFOL N/A 08/19/2022   Procedure: ESOPHAGOGASTRODUODENOSCOPY (EGD) WITH PROPOFOL;  Surgeon: Dolores Frame, MD;  [facility]: [facility];  Service: Gastroenterology;  Laterality: N/A;  130 ASA 3, pt knows to arrive at 6:15   EYE SURGERY  08   cataract surgery of the left eye   FEMUR IM NAIL Left 03/02/2017   Procedure: INTRAMEDULLARY (IM) NAIL FEMORAL;  Surgeon: Samson Frederic, MD;  Location: [facility] OR;  Service: Orthopedics;  Laterality: Left;   HARDWARE REMOVAL Right 01/17/2013    Procedure: REMOVAL OF HARDWARE AND EXCISION ULNAR STYLOID RIGHT WRIST;  Surgeon: Tami Ribas, MD;  Location: [facility];  Service: Orthopedics;  Laterality: Right;   implanted heart monitor  02/2020   to monitor Afib   IR 3D INDEPENDENT WKST  09/26/2022   IR ANGIOGRAM SELECTIVE EACH ADDITIONAL VESSEL  09/26/2022   IR ANGIOGRAM SELECTIVE EACH ADDITIONAL VESSEL  09/26/2022   IR ANGIOGRAM SELECTIVE EACH ADDITIONAL VESSEL  09/26/2022   IR ANGIOGRAM SELECTIVE EACH ADDITIONAL VESSEL  09/26/2022   IR ANGIOGRAM SELECTIVE EACH ADDITIONAL VESSEL  09/26/2022   IR ANGIOGRAM SELECTIVE EACH ADDITIONAL VESSEL  10/10/2022   IR ANGIOGRAM SELECTIVE EACH ADDITIONAL VESSEL  10/10/2022   IR ANGIOGRAM SELECTIVE EACH ADDITIONAL VESSEL  10/10/2022   IR ANGIOGRAM VISCERAL SELECTIVE  09/26/2022   IR ANGIOGRAM VISCERAL SELECTIVE  10/10/2022   IR EMBO TUMOR ORGAN ISCHEMIA INFARCT INC GUIDE ROADMAPPING  10/10/2022   IR INTRAVASCULAR ULTRASOUND NON CORONARY  09/26/2022   IR RADIOLOGIST EVAL & MGMT  09/10/2022   IR RADIOLOGIST EVAL & MGMT  11/03/2022  IR RADIOLOGIST EVAL & MGMT  02/10/2023   IR RADIOLOGIST EVAL & MGMT  05/15/2023   IR TRANSCATH PLC STENT 1ST ART NOT LE CV CAR VERT CAR  09/26/2022   IR US GUIDE VASC ACCESS RIGHT  09/26/2022   IR US GUIDE VASC ACCESS RIGHT  10/10/2022   MYRINGOTOMY  2012   both ears   ORIF FEMUR FRACTURE Left 04/23/2017   Procedure: OPEN REDUCTION INTERNAL FIXATION (ORIF) DISTAL FEMUR FRACTURE (FRACTURE AROUND FEMORAL NAIL);  Surgeon: Myrene Galas, MD;  Location: [facility] OR;  Service: Orthopedics;  Laterality: Left;   TONSILLECTOMY     TUBAL LIGATION     VAGINAL HYSTERECTOMY  1972   WRIST SURGERY     rt wrist hardwear removal    Social History   Socioeconomic History   Marital status: Widowed    Spouse name: Not on file   Number of children: 2   Years of education: 2 years college   Highest education level: Some college, no degree  Occupational History    Employer: RETIRED  Tobacco  Use   Smoking status: Former    Current packs/day: 0.00    Average packs/day: 0.3 packs/day for 50.0 years (12.5 ttl pk-yrs)    Types: Cigarettes    Start date: 07/09/1972    Quit date: 07/09/2022    Years since quitting: 1.0    Passive exposure: Current   Smokeless tobacco: Never   Tobacco comments:    12/23/21 1 pack Q two weeks.  Vaping Use   Vaping status: Never Used  Substance and Sexual Activity   Alcohol use: No    Alcohol/week: 0.0 standard drinks of alcohol   Drug use: No   Sexual activity: Not Currently    Birth control/protection: Surgical  Other Topics Concern   Not on file  Social History Narrative   12/23/21  Lives with 3 grandchildren, sone and dgtr-in -law are across the st   Social Determinants of Health   Financial Resource Strain: Low Risk  (09/15/2022)   Overall Financial Resource Strain (CARDIA)    Difficulty of Paying Living Expenses: Not hard at all  Food Insecurity: No Food Insecurity (07/31/2023)   Hunger Vital Sign    Worried About Running Out of Food in the Last Year: Never true    Ran Out of Food in the Last Year: Never true  Transportation Needs: No Transportation Needs (07/31/2023)   PRAPARE - Administrator, Civil Service (Medical): No    Lack of Transportation (Non-Medical): No  Physical Activity: Inactive (03/16/2019)   Exercise Vital Sign    Days of Exercise per Week: 0 days    Minutes of Exercise per Session: 0 min  Stress: No Stress Concern Present (03/16/2019)   [employer] of Occupational Health - Occupational Stress Questionnaire    Feeling of Stress : Not at all  Social Connections: Moderately Isolated (03/16/2019)   Social Connection and Isolation Panel [NHANES]    Frequency of Communication with Friends and Family: More than three times a week    Frequency of Social Gatherings with Friends and Family: Never    Attends Religious Services: 1 to 4 times per year    Active Member of [employer] or Organizations: No    Attends  Banker Meetings: Never    Marital Status: Widowed  Intimate Partner Violence: Not At Risk (07/31/2023)   Humiliation, Afraid, Rape, and Kick questionnaire    Fear of Current or Ex-Partner: No    Emotionally Abused:  No    Physically Abused: No    Sexually Abused: No   Family History  Problem Relation Age of Onset   Diabetes Mother    Heart failure Father    Heart failure Maternal Aunt       VITAL SIGNS BP (!) 98/58   Pulse 68   Temp (!) 97.2 F (36.2 C)   Resp 20   Ht 5\' 2"  (1.575 m)   Wt 124 lb 6.4 oz (56.4 kg)   SpO2 96%   BMI 22.75 kg/m   Outpatient Encounter Medications as of 08/03/2023  Medication Sig   acetaminophen (TYLENOL) 500 MG tablet Take 1,000 mg by mouth at bedtime.   aspirin EC 81 MG tablet Take 1 tablet (81 mg total) by mouth daily. Swallow whole.   atorvastatin (LIPITOR) 80 MG tablet Take 1 tablet (80 mg total) by mouth at bedtime.   CALCIUM CITRATE PO Take 200 mg by mouth daily.   carvedilol (COREG) 6.25 MG tablet TAKE (1) TABLET BY MOUTH TWICE DAILY WITH A MEAL.   Cholecalciferol (VITAMIN D3) 50 MCG (2000 UT) TABS Take 2,000 Units by mouth daily.   Coenzyme Q10 (CO Q-10) 100 MG CAPS Take 100 mg by mouth in the morning.   dabigatran (PRADAXA) 150 MG CAPS capsule Take 1 capsule (150 mg total) by mouth every 12 (twelve) hours.   diltiazem (CARDIZEM CD) 240 MG 24 hr capsule Take 1 capsule (240 mg total) by mouth daily.   doxepin (SINEQUAN) 25 MG capsule Take 25 mg by mouth at bedtime.   furosemide (LASIX) 40 MG tablet Take 1 tablet (40 mg total) by mouth 2 (two) times daily.   lactulose (CHRONULAC) 10 GM/15ML solution Take 30 g by mouth daily at 6 (six) AM.   Multiple Vitamin (MULTIVITAMIN WITH MINERALS) TABS tablet Take 1 tablet by mouth daily.   omeprazole (PRILOSEC) 40 MG capsule TAKE (1) CAPSULE BY MOUTH ONCE DAILY.   potassium chloride SA (KLOR-CON M) 20 MEQ tablet Take 20 mEq by mouth 2 (two) times daily.      SIGNIFICANT DIAGNOSTIC  EXAMS  TODAY  07-30-23: wbc 6.4; hgb 13.3; hct 39.8; mcv 95.0 plt 137; glucose 157; bun 14; creat 1.09; k+ 3.1; na++ 138; ca 8.7; gfr 51; total bili 3.8; protein 6.2; albumin 2.9; hgb A1c 5.3; blood culture: NG 07-31-23: chol 119; ldl 77; trig 50; hdl 32; mag 1.7 08-03-23: wbc 6.6; hgb 11.7; hct 36.0; mcv 95.0 plt 106; glucose 86; bun 12; creat 1.10 ;k+ 3.3; na++ 136; ca 7.7; gfr 51; ammonia 49   Review of Systems  Constitutional:  Negative for malaise/fatigue.  Respiratory:  Negative for cough and shortness of breath.   Cardiovascular:  Negative for chest pain, palpitations and leg swelling.  Gastrointestinal:  Negative for abdominal pain, constipation and heartburn.  Musculoskeletal:  Negative for back pain, joint pain and myalgias.  Skin: Negative.   Neurological:  Negative for dizziness.  Psychiatric/Behavioral:  The patient is not nervous/anxious.    Physical Exam Constitutional:      General: She is not in acute distress.    Appearance: She is well-developed. She is not diaphoretic.  Neck:     Thyroid: No thyromegaly.     Vascular: Carotid bruit present.  Cardiovascular:     Rate and Rhythm: Normal rate and regular rhythm.     Pulses: Normal pulses.     Heart sounds: Murmur heard.  Pulmonary:     Effort: Pulmonary effort is normal. No respiratory distress.  Breath sounds: Normal breath sounds.  Abdominal:     General: Bowel sounds are normal. There is no distension.     Palpations: Abdomen is soft.     Tenderness: There is no abdominal tenderness.  Musculoskeletal:        General: Normal range of motion.     Cervical back: Neck supple.     Right lower leg: Edema present.     Left lower leg: Edema present.  Lymphadenopathy:     Cervical: No cervical adenopathy.  Skin:    General: Skin is warm and dry.  Neurological:     Mental Status: She is alert. Mental status is at baseline.  Psychiatric:        Mood and Affect: Mood normal.     ASSESSMENT/  PLAN:  TODAY  Aortic atherosclerosis (ct 07-30-23); is on asa and statin  2. Chronic diastolic HF (heart failure): is on lasix 40 mg twice daily; coreg 6.25 mg twice daily   3. Persistent atrial fibrillation: heart rate stable will continue pradaxa 150 mg twice daily and coreg 6.25 mg twice daily and cardizem cd 240 mg daily for rate control  4. Cirrhosis of liver without ascites unspecific cirrhosis type/hepatocellular carcinoma: is on lasix 40 mg twice daily and will increase to  lactulose 30 gm twice daily will repeat level in one week.  is status post radioembolization  5.  Gastroesophageal reflux disease with esophagitis without hemorrhage; will continue prilosec 40 mg daily   6. Type 2 diabetes mellitus with stage 3 chronic kidney disease and hypertension: hgb A1c 5.3 is on asa statin   7. CKD stage 3 due to type 2 diabetes mellitus: bun 12; creat 1.10; gfr 51  8. Hypertension associated with stage 3a chronic kidney disease due to type 2 diabetes mellitus: b/p 98/58 will continue coreg 6.25 mg twice daily and cardizem cd 240 mg daily; may need to lower her medications; if her readings remain soft  9. Hyperlipidemia associate with type 2 diabetes mellitus: ldl 77 will continue lipitor 80 mg daily   10. History of cva: is on statin and asa 81 mg daily   11. Major depression recurrent chronic: will continue doxepin 25 mg nightly   12. Chronic generalized pain: will continue tylenol 1 gm nightly   13. Hypokalemia will continue k+ 20 meq twice daily   14. Thrombocytopenia: plt 106  15. Protein calorie malnutrition: albumin 2.9 will begin prosource 30 ml three times daily   16. Hypocalcemia: will increase calcium to twice daily   17. Anemia associate with stage 3 chronic kidney disease: hgb 11.7 down from 13  18.  Pleural effusion: status post right thoracentesis of 1.3 liters   Synthia Innocent NP [facility]   call [phone]

## 2023-08-04 ENCOUNTER — Non-Acute Institutional Stay (SKILLED_NURSING_FACILITY): Payer: Medicare Other | Admitting: Internal Medicine

## 2023-08-04 ENCOUNTER — Encounter: Payer: Self-pay | Admitting: Internal Medicine

## 2023-08-04 DIAGNOSIS — E43 Unspecified severe protein-calorie malnutrition: Secondary | ICD-10-CM | POA: Diagnosis not present

## 2023-08-04 DIAGNOSIS — K7682 Hepatic encephalopathy: Secondary | ICD-10-CM

## 2023-08-04 DIAGNOSIS — I10 Essential (primary) hypertension: Secondary | ICD-10-CM | POA: Diagnosis not present

## 2023-08-04 DIAGNOSIS — J90 Pleural effusion, not elsewhere classified: Secondary | ICD-10-CM

## 2023-08-04 DIAGNOSIS — C220 Liver cell carcinoma: Secondary | ICD-10-CM | POA: Diagnosis not present

## 2023-08-04 LAB — CULTURE, BLOOD (ROUTINE X 2)
Culture: NO GROWTH
Culture: NO GROWTH

## 2023-08-04 NOTE — Assessment & Plan Note (Signed)
34.7 albumin 2.9 documenting severe protein caloric malnutrition.  Interosseous wasting noted on exam.  Nutritionist at SNF to follow.

## 2023-08-04 NOTE — Patient Instructions (Signed)
See assessment and plan under each diagnosis in the problem list and acutely for this visit 

## 2023-08-04 NOTE — Progress Notes (Unsigned)
NURSING HOME LOCATION:  [facility] ROOM NUMBER: [id number] P   CODE STATUS:  Full Code  PCP:  Elfredia Nevins MD  This is a comprehensive admission note to this SNFperformed on this date less than 30 days from date of admission. Included are preadmission medical/surgical history; reconciled medication list; family history; social history and comprehensive review of systems.  Corrections and additions to the records were documented. Comprehensive physical exam was also performed. Additionally a clinical summary was entered for each active diagnosis pertinent to this admission in the Problem List to enhance continuity of care.  HPI: She was hospitalized 11/21 - 08/01/2023 for change in mental status.  Apparently after awakening she was nonverbal for approximately 2 hours.  This is in the context of diagnosis of hepatocellular cancer and cirrhosis. Lactic acid was 2.5; white blood counts were normal.  Potassium at presentation was 3.1 but subsequent nadir was 2.6. MRI of the brain revealed acute ischemic infarct in the left MCA, probably cardioembolic.  Neurology consultant recommended holding Eliquis and continuing aspirin.  Carotid Dopplers revealed moderate calcific plaque in the ICA origins with approximately 50-69% stenosis.  Moderate to severe AI and moderate to severe AS were present.  Pradaxa was initiated 11/22.  Secondary prevention was conducted with statin and continuation of aspirin.   Chest x-ray revealed bilateral pleural effusions.  Empirically she received Rocephin and azithromycin.  Interventional Radiology performed thoracentesis removing 1.3 L of fluid from the right pleural space. Lab values suggest transudate; no cytology studies were performed. Lactulose and Lasix were renewed.   Nadir potassium was 3.6 and final value at discharge 2.8.  Creatinine ranged from a final value of 0.78 down from the initial value of 1.09.   GFR ranged from 51 to over 60.  Total protein  was 4.7 and albumin 2.9 suggesting protein/caloric malnutrition. Initial H/H was 13.3/39.8 but dropped to 10.8/32.  Platelet count initially was 137,000.  Final value 100,000.  Glucoses while hospitalized ranged from a low of 86 up to high 157.  A1c was 5.3%, prediabetic. PT/OT Recommended SNF Placement for Rehab.  Past medical and surgical history is long and complicated.  It includes aortic insufficiency, chronic anemia, atrial fibrillation, bilateral carotid stenosis, CAD, DDD, depression, dyslipidemia, NASH, IBS, OSA, history of seizures, stroke, and diabetes with vascular complications. Surgical history is also complex, including back surgery, cardiac catheterization, colonoscopy, ERCP, esophageal banding on multiple occasions, nailing of left femur, vaginal hysterectomy, and Y90 embolization of hepatocellular cancer of the left lobe of the liver.  Family history: reviewed, non contributory due to advanced age.  Social history: Nondrinker; former smoker.   Review of systems: Clinical neurocognitive deficits made validity of responses questionable , compromising ROS completion.  When asked why she had been hospitalized her response was "they diagnosed cancer."  When asked what kind of cancer she replied "kind behind the throat."  She denied significant symptoms stating that everything was "fine."  When I asked the name of her Oncologist she stated "I guess Dr. Corinda Gubler." He is retired Occupational psychologist. She at that point denied having even been hospitalized. She does validate that her heart "races and skips some time."  She states that she had a heart attack in 1964.  She validates that she snores.  She denies peripheral edema despite its present on exam.  Constitutional: No fever, significant weight change, fatigue  Eyes: No redness, discharge, pain, vision change ENT/mouth: No nasal congestion, purulent discharge, earache, change in hearing, sore throat  Cardiovascular: No chest pain,paroxysmal  nocturnal dyspnea, claudication  Respiratory: No cough, sputum production, hemoptysis, DOE Gastrointestinal: No heartburn, dysphagia, abdominal pain, nausea /vomiting, rectal bleeding, melena, change in bowels Genitourinary: No dysuria, hematuria, pyuria, incontinence, nocturia Musculoskeletal: No joint stiffness, joint swelling, weakness, pain Dermatologic: No rash, pruritus, change in appearance of skin Neurologic: No dizziness, headache, syncope, seizures, numbness, tingling Psychiatric: No significant anxiety, depression, insomnia, anorexia Endocrine: No change in hair/skin/nails, excessive thirst, excessive hunger, excessive urination  Hematologic/lymphatic: No significant bruising, lymphadenopathy, abnormal bleeding Allergy/immunology: No itchy/watery eyes, significant sneezing, urticaria, angioedema  Physical exam:  Pertinent or positive findings: She appears chronically ill and gaunt.  Hair is disheveled.  Pupils are small.  Lids are puffy.  She has a grade 1 systolic murmur at the base.  She has bilateral carotid coarse bruits.  She has 3/4+ edema at the sock line.  Pedal pulses are not palpable.  She has interosseous wasting of the hands.  A brace for the left lower extremity was sitting in the corner.  General appearance: no acute distress, increased work of breathing is present.   Lymphatic: No lymphadenopathy about the head, neck, axilla. Eyes: No conjunctival inflammation or lid edema is present. There is no scleral icterus. Ears:  External ear exam shows no significant lesions or deformities.   Nose:  External nasal examination shows no deformity or inflammation. Nasal mucosa are pink and moist without lesions, exudates Oral exam: Lips and gums are healthy appearing.There is no oropharyngeal erythema or exudate. Neck:  No thyromegaly, masses, tenderness noted.    Heart:  Normal rate and regular rhythm. S1 and S2 normal without gallop, click, rub.  Lungs: Chest clear to  auscultation without wheezes, rhonchi, rales, rubs. Abdomen: Bowel sounds are normal.  Abdomen is soft and nontender with no organomegaly, hernias, masses. GU: Deferred  Extremities:  No cyanosis, clubbing. Neurologic exam:  Balance, Rhomberg, finger to nose testing could not be completed due to clinical state Skin: Warm & dry w/o tenting. No significant lesions or rash.  See clinical summary under each active problem in the Problem List with associated updated therapeutic plan

## 2023-08-04 NOTE — Assessment & Plan Note (Signed)
Lab values suggest transudate.  No cytology performed despite the history of hepatocellular cancer. Monitor for recurrence.

## 2023-08-04 NOTE — Assessment & Plan Note (Signed)
06/09/2023 Dr. Marice Potter note reviewed.S/P Y90 radioembolization there is been no increase in size of the lesion.  PET scan 9/9 had revealed no hypermetabolic hepatic lesions despite increase in the AFP from 1528 up to 2492. Oncology follow-up as scheduled.

## 2023-08-04 NOTE — Assessment & Plan Note (Addendum)
She was hospitalized 11/21 - 08/01/2023 for change in mental status.  Apparently upon awakening she had been nonverbal for approximately 2 hours.  MRI revealed acute ischemic infarct in left MCA probably cardioembolic.  Apixaban changed to Pradaxa with continuation of aspirin.  MRI findings was history of prior stroke suggest significant component of vascular dementia. Lactulose restarted prior to discharge. 08/03/2023 ammonia level 49; (9-350  ammonia level was not checked while hospitalized. On exam severe neurocognitive deficit documented.  She denied having been hospitalized and responses were unfocused and somewhat confabulatory. Monitor ammonia level.

## 2023-08-05 LAB — CYTOLOGY - NON PAP

## 2023-08-05 NOTE — Assessment & Plan Note (Signed)
Blood pressure average will need to be verified as it is currently soft.  It was reported she is only taking 120 mg of diltiazem daily.  She is also on carvedilol 6.25 mg twice daily. If persistently soft blood pressures are documented these can be titrated down.

## 2023-08-06 LAB — CULTURE, BODY FLUID W GRAM STAIN -BOTTLE: Culture: NO GROWTH

## 2023-08-10 ENCOUNTER — Non-Acute Institutional Stay (SKILLED_NURSING_FACILITY): Payer: Medicare Other | Admitting: Adult Health

## 2023-08-10 ENCOUNTER — Other Ambulatory Visit (HOSPITAL_COMMUNITY)
Admission: RE | Admit: 2023-08-10 | Discharge: 2023-08-10 | Disposition: A | Discharge status: Home / Self Care | Payer: Medicare Other | Source: Skilled Nursing Facility | Attending: Adult Health | Admitting: Adult Health

## 2023-08-10 ENCOUNTER — Encounter: Payer: Self-pay | Admitting: Adult Health

## 2023-08-10 DIAGNOSIS — D649 Anemia, unspecified: Secondary | ICD-10-CM | POA: Insufficient documentation

## 2023-08-10 DIAGNOSIS — N1831 Chronic kidney disease, stage 3a: Secondary | ICD-10-CM | POA: Insufficient documentation

## 2023-08-10 DIAGNOSIS — E876 Hypokalemia: Secondary | ICD-10-CM

## 2023-08-10 DIAGNOSIS — E1159 Type 2 diabetes mellitus with other circulatory complications: Secondary | ICD-10-CM | POA: Insufficient documentation

## 2023-08-10 DIAGNOSIS — I152 Hypertension secondary to endocrine disorders: Secondary | ICD-10-CM | POA: Insufficient documentation

## 2023-08-10 DIAGNOSIS — K7460 Unspecified cirrhosis of liver: Secondary | ICD-10-CM | POA: Diagnosis not present

## 2023-08-10 DIAGNOSIS — I5032 Chronic diastolic (congestive) heart failure: Secondary | ICD-10-CM | POA: Insufficient documentation

## 2023-08-10 LAB — CBC WITH DIFFERENTIAL/PLATELET
Abs Immature Granulocytes: 0.01 10*3/uL (ref 0.00–0.07)
Basophils Absolute: 0 10*3/uL (ref 0.0–0.1)
Basophils Relative: 0 %
Eosinophils Absolute: 0.5 10*3/uL (ref 0.0–0.5)
Eosinophils Relative: 10 %
HCT: 33.9 % — ABNORMAL LOW (ref 36.0–46.0)
Hemoglobin: 11.6 g/dL — ABNORMAL LOW (ref 12.0–15.0)
Immature Granulocytes: 0 %
Lymphocytes Relative: 26 %
Lymphs Abs: 1.4 10*3/uL (ref 0.7–4.0)
MCH: 32.2 pg (ref 26.0–34.0)
MCHC: 34.2 g/dL (ref 30.0–36.0)
MCV: 94.2 fL (ref 80.0–100.0)
Monocytes Absolute: 0.6 10*3/uL (ref 0.1–1.0)
Monocytes Relative: 11 %
Neutro Abs: 2.9 10*3/uL (ref 1.7–7.7)
Neutrophils Relative %: 53 %
Platelets: 138 10*3/uL — ABNORMAL LOW (ref 150–400)
RBC: 3.6 MIL/uL — ABNORMAL LOW (ref 3.87–5.11)
RDW: 14.6 % (ref 11.5–15.5)
WBC: 5.4 10*3/uL (ref 4.0–10.5)
nRBC: 0 % (ref 0.0–0.2)

## 2023-08-10 LAB — BASIC METABOLIC PANEL
Anion gap: 6 (ref 5–15)
BUN: 15 mg/dL (ref 8–23)
CO2: 25 mmol/L (ref 22–32)
Calcium: 8.9 mg/dL (ref 8.9–10.3)
Chloride: 103 mmol/L (ref 98–111)
Creatinine, Ser: 1.28 mg/dL — ABNORMAL HIGH (ref 0.44–1.00)
GFR, Estimated: 42 mL/min — ABNORMAL LOW (ref >60)
Glucose, Bld: 108 mg/dL — ABNORMAL HIGH (ref 70–99)
Potassium: 3.2 mmol/L — ABNORMAL LOW (ref 3.5–5.1)
Sodium: 134 mmol/L — ABNORMAL LOW (ref 135–145)

## 2023-08-10 LAB — AMMONIA: Ammonia: 52 umol/L — ABNORMAL HIGH (ref 9–35)

## 2023-08-10 LAB — PHOSPHORUS: Phosphorus: 3.2 mg/dL (ref 2.5–4.6)

## 2023-08-10 NOTE — Progress Notes (Signed)
Location:  [facility] Room Number: 124 Place of Service:  SNF (31)   CODE STATUS: full   Allergies  Allergen Reactions   Tape Rash and Other (See Comments)    Old paper tape caused a reaction, doesn't react to most other tapes     Chief Complaint  Patient presents with   Acute Visit    Follow up labs     HPI:  Her ammonia level remains elevated at 52 with previous at 49. Her k+is low at 3.2.  Her blood pressure readings remain soft at 107/56. She denies any chest pain no shortness of breath. There are no reports of excessive lethargy. There are no reports of uncontrolled pain.   Past Medical History:  Diagnosis Date   Anemia    Anxiety    Aortic insufficiency    Moderate   Atrial fibrillation (HCC)    Back pain    Carotid stenosis, bilateral    Cirrhosis (HCC)    Closed left femoral fracture (HCC) 04/21/2017   Coronary artery disease    Stent x 2 RCA 1995, cardiac catheterization 09/2016 showing only mild atherosclerosis   DDD (degenerative disc disease), lumbar    Depression    Diabetes mellitus without complication (HCC)    Essential hypertension    Falls    GERD (gastroesophageal reflux disease)    Hiatal hernia    History of stroke    Hypercholesteremia    IBS (irritable bowel syndrome)    Non-alcoholic fatty liver disease    OSA (obstructive sleep apnea)    no CPAP   Pericardial effusion    a. small by echo 2018.   PSVT (paroxysmal supraventricular tachycardia) (HCC)    Recurrent UTI    Seizures (HCC) 2018   Stroke (HCC) 07/2019   several strokes within the last 10 years   Swallowing difficulty    Varices, esophageal (HCC)     Past Surgical History:  Procedure Laterality Date   ANAL FISSURE REPAIR  1972   APPENDECTOMY     BACK SURGERY  1985   CARDIAC CATHETERIZATION  [phone]   Stent to the proximal RCA after MI    CARDIAC CATHETERIZATION N/A 09/26/2016   Procedure: Left Heart Cath and Coronary Angiography;   Surgeon: Marykay Lex, MD;  Location: [facility] INVASIVE CV LAB;  Service: Cardiovascular;  Laterality: N/A;   CATARACT EXTRACTION W/PHACO Right 07/04/2014   Procedure: CATARACT EXTRACTION PHACO AND INTRAOCULAR LENS PLACEMENT (IOC);  Surgeon: Loraine Leriche T. Nile Riggs, MD;  Location: [facility];  Service: Ophthalmology;  Laterality: Right;  CDE:13.13   COLONOSCOPY     DILATION AND CURETTAGE OF UTERUS     x2   Epi Retinal Membrane Peel Left    ERCP     ESOPHAGEAL BANDING N/A 04/01/2013   Procedure: ESOPHAGEAL BANDING;  Surgeon: Malissa Hippo, MD;  Location: [facility];  Service: Endoscopy;  Laterality: N/A;   ESOPHAGEAL BANDING N/A 05/24/2013   Procedure: ESOPHAGEAL BANDING;  Surgeon: Malissa Hippo, MD;  Location: [facility];  Service: Endoscopy;  Laterality: N/A;   ESOPHAGEAL BANDING N/A 06/21/2014   Procedure: ESOPHAGEAL BANDING;  Surgeon: Malissa Hippo, MD;  Location: [facility];  Service: Endoscopy;  Laterality: N/A;   ESOPHAGEAL BANDING  06/15/2020   Procedure: ESOPHAGEAL BANDING;  Surgeon: Dolores Frame, MD;  Location: [facility];  Service: Gastroenterology;;   ESOPHAGOGASTRODUODENOSCOPY N/A 04/01/2013   Procedure: ESOPHAGOGASTRODUODENOSCOPY (EGD);  Surgeon: Malissa Hippo, MD;  Location: [facility];  Service: Endoscopy;  Laterality: N/A;  230-rescheduled to 8:30am Ann notified pt   ESOPHAGOGASTRODUODENOSCOPY N/A 05/24/2013   Procedure: ESOPHAGOGASTRODUODENOSCOPY (EGD);  Surgeon: Malissa Hippo, MD;  Location: [facility];  Service: Endoscopy;  Laterality: N/A;  730   ESOPHAGOGASTRODUODENOSCOPY N/A 06/21/2014   Procedure: ESOPHAGOGASTRODUODENOSCOPY (EGD);  Surgeon: Malissa Hippo, MD;  Location: [facility];  Service: Endoscopy;  Laterality: N/A;  930-rescheduled 10/14 @ 1200 Ann to notify pt   ESOPHAGOGASTRODUODENOSCOPY (EGD) WITH PROPOFOL N/A 06/15/2020   Procedure: ESOPHAGOGASTRODUODENOSCOPY (EGD) WITH PROPOFOL;  Surgeon: Dolores Frame, MD;  Location: [facility];  Service: Gastroenterology;  Laterality: N/A;  230   ESOPHAGOGASTRODUODENOSCOPY (EGD) WITH PROPOFOL N/A 07/27/2020   Procedure: ESOPHAGOGASTRODUODENOSCOPY (EGD) WITH PROPOFOL;  Surgeon: Dolores Frame, MD;  Location: [facility];  Service: Gastroenterology;  Laterality: N/A;  1245   ESOPHAGOGASTRODUODENOSCOPY (EGD) WITH PROPOFOL N/A 08/19/2022   Procedure: ESOPHAGOGASTRODUODENOSCOPY (EGD) WITH PROPOFOL;  Surgeon: Dolores Frame, MD;  Location: [facility];  Service: Gastroenterology;  Laterality: N/A;  130 ASA 3, pt knows to arrive at 6:15   EYE SURGERY  08   cataract surgery of the left eye   FEMUR IM NAIL Left 03/02/2017   Procedure: INTRAMEDULLARY (IM) NAIL FEMORAL;  Surgeon: Samson Frederic, MD;  Location: [facility] OR;  Service: Orthopedics;  Laterality: Left;   HARDWARE REMOVAL Right 01/17/2013   Procedure: REMOVAL OF HARDWARE AND EXCISION ULNAR STYLOID RIGHT WRIST;  Surgeon: Tami Ribas, MD;  Location: [facility];  Service: Orthopedics;  Laterality: Right;   implanted heart monitor  02/2020   to monitor Afib   IR 3D INDEPENDENT WKST  09/26/2022   IR ANGIOGRAM SELECTIVE EACH ADDITIONAL VESSEL  09/26/2022   IR ANGIOGRAM SELECTIVE EACH ADDITIONAL VESSEL  09/26/2022   IR ANGIOGRAM SELECTIVE EACH ADDITIONAL VESSEL  09/26/2022   IR ANGIOGRAM SELECTIVE EACH ADDITIONAL VESSEL  09/26/2022   IR ANGIOGRAM SELECTIVE EACH ADDITIONAL VESSEL  09/26/2022   IR ANGIOGRAM SELECTIVE EACH ADDITIONAL VESSEL  10/10/2022   IR ANGIOGRAM SELECTIVE EACH ADDITIONAL VESSEL  10/10/2022   IR ANGIOGRAM SELECTIVE EACH ADDITIONAL VESSEL  10/10/2022   IR ANGIOGRAM VISCERAL SELECTIVE  09/26/2022   IR ANGIOGRAM VISCERAL SELECTIVE  10/10/2022   IR EMBO TUMOR ORGAN ISCHEMIA INFARCT INC GUIDE ROADMAPPING  10/10/2022   IR INTRAVASCULAR ULTRASOUND NON CORONARY  09/26/2022   IR RADIOLOGIST EVAL & MGMT  09/10/2022   IR RADIOLOGIST EVAL & MGMT  11/03/2022   IR RADIOLOGIST EVAL & MGMT  02/10/2023   IR  RADIOLOGIST EVAL & MGMT  05/15/2023   IR TRANSCATH PLC STENT 1ST ART NOT LE CV CAR VERT CAR  09/26/2022   IR US GUIDE VASC ACCESS RIGHT  09/26/2022   IR US GUIDE VASC ACCESS RIGHT  10/10/2022   MYRINGOTOMY  2012   both ears   ORIF FEMUR FRACTURE Left 04/23/2017   Procedure: OPEN REDUCTION INTERNAL FIXATION (ORIF) DISTAL FEMUR FRACTURE (FRACTURE AROUND FEMORAL NAIL);  Surgeon: Myrene Galas, MD;  Location: [facility] OR;  Service: Orthopedics;  Laterality: Left;   TONSILLECTOMY     TUBAL LIGATION     VAGINAL HYSTERECTOMY  1972   WRIST SURGERY     rt wrist hardwear removal    Social History   Socioeconomic History   Marital status: Widowed    Spouse name: Not on file   Number of children: 2   Years of education: 2 years college   Highest education level: Some  college, no degree  Occupational History    Employer: RETIRED  Tobacco Use   Smoking status: Former    Current packs/day: 0.00    Average packs/day: 0.3 packs/day for 50.0 years (12.5 ttl pk-yrs)    Types: Cigarettes    Start date: 07/09/1972    Quit date: 07/09/2022    Years since quitting: 1.0    Passive exposure: Current   Smokeless tobacco: Never   Tobacco comments:    12/23/21 1 pack Q two weeks.  Vaping Use   Vaping status: Never Used  Substance and Sexual Activity   Alcohol use: No    Alcohol/week: 0.0 standard drinks of alcohol   Drug use: No   Sexual activity: Not Currently    Birth control/protection: Surgical  Other Topics Concern   Not on file  Social History Narrative   12/23/21  Lives with 3 grandchildren, sone and dgtr-in -law are across the st   Social Determinants of Health   Financial Resource Strain: Low Risk  (09/15/2022)   Overall Financial Resource Strain (CARDIA)    Difficulty of Paying Living Expenses: Not hard at all  Food Insecurity: No Food Insecurity (07/31/2023)   Hunger Vital Sign    Worried About Running Out of Food in the Last Year: Never true    Ran Out of Food in the Last Year: Never true   Transportation Needs: No Transportation Needs (07/31/2023)   PRAPARE - Administrator, Civil Service (Medical): No    Lack of Transportation (Non-Medical): No  Physical Activity: Inactive (03/16/2019)   Exercise Vital Sign    Days of Exercise per Week: 0 days    Minutes of Exercise per Session: 0 min  Stress: No Stress Concern Present (03/16/2019)   [employer] of Occupational Health - Occupational Stress Questionnaire    Feeling of Stress : Not at all  Social Connections: Moderately Isolated (03/16/2019)   Social Connection and Isolation Panel [NHANES]    Frequency of Communication with Friends and Family: More than three times a week    Frequency of Social Gatherings with Friends and Family: Never    Attends Religious Services: 1 to 4 times per year    Active Member of [employer] or Organizations: No    Attends Banker Meetings: Never    Marital Status: Widowed  Intimate Partner Violence: Not At Risk (07/31/2023)   Humiliation, Afraid, Rape, and Kick questionnaire    Fear of Current or Ex-Partner: No    Emotionally Abused: No    Physically Abused: No    Sexually Abused: No   Family History  Problem Relation Age of Onset   Diabetes Mother    Heart failure Father    Heart failure Maternal Aunt       VITAL SIGNS BP (!) 107/56   Pulse 64   Temp (!) 97 F (36.1 C)   Resp 16   Ht 5\' 2"  (1.575 m)   Wt 126 lb 6.4 oz (57.3 kg)   SpO2 97%   BMI 23.12 kg/m   Outpatient Encounter Medications as of 08/10/2023  Medication Sig   acetaminophen (TYLENOL) 500 MG tablet Take 1,000 mg by mouth at bedtime.   aspirin EC 81 MG tablet Take 1 tablet (81 mg total) by mouth daily. Swallow whole.   atorvastatin (LIPITOR) 80 MG tablet Take 1 tablet (80 mg total) by mouth at bedtime.   CALCIUM CITRATE PO Take 200 mg by mouth daily.   carvedilol (COREG) 6.25 MG tablet  TAKE (1) TABLET BY MOUTH TWICE DAILY WITH A MEAL.   Cholecalciferol (VITAMIN D3) 50 MCG (2000 UT) TABS  Take 2,000 Units by mouth daily.   Coenzyme Q10 (CO Q-10) 100 MG CAPS Take 100 mg by mouth in the morning.   dabigatran (PRADAXA) 150 MG CAPS capsule Take 1 capsule (150 mg total) by mouth every 12 (twelve) hours.   diltiazem (CARDIZEM CD) 240 MG 24 hr capsule Take 1 capsule (240 mg total) by mouth daily.   doxepin (SINEQUAN) 25 MG capsule Take 25 mg by mouth at bedtime.   furosemide (LASIX) 40 MG tablet Take 1 tablet (40 mg total) by mouth 2 (two) times daily.   lactulose (CHRONULAC) 10 GM/15ML solution Take 30 g by mouth daily at 6 (six) AM.   Multiple Vitamin (MULTIVITAMIN WITH MINERALS) TABS tablet Take 1 tablet by mouth daily.   omeprazole (PRILOSEC) 40 MG capsule TAKE (1) CAPSULE BY MOUTH ONCE DAILY.   potassium chloride SA (KLOR-CON M) 20 MEQ tablet Take 20 mEq by mouth 2 (two) times daily.   Facility-Administered Encounter Medications as of 08/10/2023  Medication   dexamethasone (DECADRON) injection 8 mg     SIGNIFICANT DIAGNOSTIC EXAMS  LABS REVIEWED   07-30-23: wbc 6.4; hgb 13.3; hct 39.8; mcv 95.0 plt 137; glucose 157; bun 14; creat 1.09; k+ 3.1; na++ 138; ca 8.7; gfr 51; total bili 3.8; protein 6.2; albumin 2.9; hgb A1c 5.3; blood culture: NG 07-31-23: chol 119; ldl 77; trig 50; hdl 32; mag 1.7 08-03-23: wbc 6.6; hgb 11.7; hct 36.0; mcv 95.0 plt 106; glucose 86; bun 12; creat 1.10 ;k+ 3.3; na++ 136; ca 7.7; gfr 51; ammonia 49   TODAY   08-10-23: wbc 5.4; hgb 11.6; hct 33.9; mcv 94.2 plt 138; glucose 108; bun 15; creat 1.28 k+ 3.2; na++ 134; ca 8.9; gfr 42 ammonia 52; phos 3.2    Review of Systems  Constitutional:  Negative for malaise/fatigue.  Respiratory:  Negative for cough and shortness of breath.   Cardiovascular:  Negative for chest pain, palpitations and leg swelling.  Gastrointestinal:  Negative for abdominal pain, constipation and heartburn.  Musculoskeletal:  Negative for back pain, joint pain and myalgias.  Skin: Negative.   Neurological:  Negative for  dizziness.  Psychiatric/Behavioral:  The patient is not nervous/anxious.    Physical Exam Constitutional:      General: She is not in acute distress.    Appearance: She is well-developed. She is not diaphoretic.  Neck:     Thyroid: No thyromegaly.     Vascular: Carotid bruit present.  Cardiovascular:     Rate and Rhythm: Normal rate and regular rhythm.     Pulses: Normal pulses.     Heart sounds: Murmur heard.  Pulmonary:     Effort: Pulmonary effort is normal. No respiratory distress.     Breath sounds: Normal breath sounds.  Abdominal:     General: Bowel sounds are normal. There is no distension.     Palpations: Abdomen is soft.     Tenderness: There is no abdominal tenderness.  Musculoskeletal:        General: Normal range of motion.     Cervical back: Neck supple.     Right lower leg: Edema present.     Left lower leg: Edema present.  Lymphadenopathy:     Cervical: No cervical adenopathy.  Skin:    General: Skin is warm and dry.  Neurological:     Mental Status: She is alert. Mental status is  at baseline.  Psychiatric:        Mood and Affect: Mood normal.     ASSESSMENT/ PLAN:  TODAY  Hypokalemia Cirrhosis of liver without ascites unspecified cirrhosis type  Will increase lactulose to 40 gm three times daily  Will increase k+ to 20 meq twice daily with 40 k+ one time Will repeat BMP on 08-13-23 Will repeat ammonia level on 08-24-23.    Synthia Innocent NP [facility]   call [phone]

## 2023-08-11 ENCOUNTER — Inpatient Hospital Stay: Payer: Medicare Other | Attending: Hematology

## 2023-08-11 ENCOUNTER — Ambulatory Visit (HOSPITAL_COMMUNITY): Admission: RE | Admit: 2023-08-11 | Payer: Medicare Other | Source: Ambulatory Visit

## 2023-08-11 DIAGNOSIS — C220 Liver cell carcinoma: Secondary | ICD-10-CM

## 2023-08-11 LAB — COMPREHENSIVE METABOLIC PANEL
ALT: 30 U/L (ref 0–44)
AST: 52 U/L — ABNORMAL HIGH (ref 15–41)
Albumin: 2.5 g/dL — ABNORMAL LOW (ref 3.5–5.0)
Alkaline Phosphatase: 124 U/L (ref 38–126)
Anion gap: 12 (ref 5–15)
BUN: 15 mg/dL (ref 8–23)
CO2: 24 mmol/L (ref 22–32)
Calcium: 9.6 mg/dL (ref 8.9–10.3)
Chloride: 102 mmol/L (ref 98–111)
Creatinine, Ser: 1.53 mg/dL — ABNORMAL HIGH (ref 0.44–1.00)
GFR, Estimated: 34 mL/min — ABNORMAL LOW (ref >60)
Glucose, Bld: 175 mg/dL — ABNORMAL HIGH (ref 70–99)
Potassium: 3.6 mmol/L (ref 3.5–5.1)
Sodium: 138 mmol/L (ref 135–145)
Total Bilirubin: 1.6 mg/dL — ABNORMAL HIGH (ref <1.2)
Total Protein: 6 g/dL — ABNORMAL LOW (ref 6.5–8.1)

## 2023-08-11 LAB — CBC WITH DIFFERENTIAL/PLATELET
Abs Immature Granulocytes: 0.03 10*3/uL (ref 0.00–0.07)
Basophils Absolute: 0.1 10*3/uL (ref 0.0–0.1)
Basophils Relative: 1 %
Eosinophils Absolute: 0.6 10*3/uL — ABNORMAL HIGH (ref 0.0–0.5)
Eosinophils Relative: 8 %
HCT: 39.6 % (ref 36.0–46.0)
Hemoglobin: 13.3 g/dL (ref 12.0–15.0)
Immature Granulocytes: 0 %
Lymphocytes Relative: 17 %
Lymphs Abs: 1.3 10*3/uL (ref 0.7–4.0)
MCH: 32.1 pg (ref 26.0–34.0)
MCHC: 33.6 g/dL (ref 30.0–36.0)
MCV: 95.7 fL (ref 80.0–100.0)
Monocytes Absolute: 0.9 10*3/uL (ref 0.1–1.0)
Monocytes Relative: 11 %
Neutro Abs: 4.9 10*3/uL (ref 1.7–7.7)
Neutrophils Relative %: 63 %
Platelets: 170 10*3/uL (ref 150–400)
RBC: 4.14 MIL/uL (ref 3.87–5.11)
RDW: 14.9 % (ref 11.5–15.5)
WBC: 7.8 10*3/uL (ref 4.0–10.5)
nRBC: 0 % (ref 0.0–0.2)

## 2023-08-12 LAB — AFP TUMOR MARKER: AFP, Serum, Tumor Marker: 4828 ng/mL — ABNORMAL HIGH (ref 0.0–8.7)

## 2023-08-13 ENCOUNTER — Other Ambulatory Visit (HOSPITAL_COMMUNITY)
Admission: RE | Admit: 2023-08-13 | Discharge: 2023-08-13 | Disposition: A | Discharge status: Home / Self Care | Payer: Medicare Other | Attending: Adult Health | Admitting: Adult Health

## 2023-08-13 DIAGNOSIS — I152 Hypertension secondary to endocrine disorders: Secondary | ICD-10-CM | POA: Insufficient documentation

## 2023-08-13 LAB — BASIC METABOLIC PANEL
Anion gap: 10 (ref 5–15)
BUN: 16 mg/dL (ref 8–23)
CO2: 23 mmol/L (ref 22–32)
Calcium: 9.7 mg/dL (ref 8.9–10.3)
Chloride: 106 mmol/L (ref 98–111)
Creatinine, Ser: 1.55 mg/dL — ABNORMAL HIGH (ref 0.44–1.00)
GFR, Estimated: 33 mL/min — ABNORMAL LOW (ref >60)
Glucose, Bld: 116 mg/dL — ABNORMAL HIGH (ref 70–99)
Potassium: 3.9 mmol/L (ref 3.5–5.1)
Sodium: 139 mmol/L (ref 135–145)

## 2023-08-14 ENCOUNTER — Non-Acute Institutional Stay (SKILLED_NURSING_FACILITY): Payer: Medicare Other | Admitting: Adult Health

## 2023-08-14 ENCOUNTER — Ambulatory Visit: Payer: Medicare Other | Attending: Student | Admitting: Student

## 2023-08-14 ENCOUNTER — Encounter: Payer: Self-pay | Admitting: Adult Health

## 2023-08-14 VITALS — BP 104/56 | HR 58 | Ht 62.0 in | Wt 120.8 lb

## 2023-08-14 DIAGNOSIS — K7460 Unspecified cirrhosis of liver: Secondary | ICD-10-CM | POA: Diagnosis not present

## 2023-08-14 DIAGNOSIS — F339 Major depressive disorder, recurrent, unspecified: Secondary | ICD-10-CM

## 2023-08-14 DIAGNOSIS — E782 Mixed hyperlipidemia: Secondary | ICD-10-CM

## 2023-08-14 DIAGNOSIS — I10 Essential (primary) hypertension: Secondary | ICD-10-CM | POA: Diagnosis not present

## 2023-08-14 DIAGNOSIS — I700 Atherosclerosis of aorta: Secondary | ICD-10-CM

## 2023-08-14 DIAGNOSIS — C220 Liver cell carcinoma: Secondary | ICD-10-CM

## 2023-08-14 DIAGNOSIS — I4819 Other persistent atrial fibrillation: Secondary | ICD-10-CM

## 2023-08-14 DIAGNOSIS — I2510 Atherosclerotic heart disease of native coronary artery without angina pectoris: Secondary | ICD-10-CM

## 2023-08-14 DIAGNOSIS — I350 Nonrheumatic aortic (valve) stenosis: Secondary | ICD-10-CM

## 2023-08-14 MED ORDER — FUROSEMIDE 40 MG PO TABS: 40.0000 mg | ORAL_TABLET | Freq: Every day | ORAL | 3 refills | Status: DC

## 2023-08-14 NOTE — Progress Notes (Signed)
Location:  [facility] Room Number: 128-P Place of Service:  SNF (31) Provider: Synthia Innocent, NP  CODE STATUS: FULL CODE  Allergies  Allergen Reactions   Tape Rash and Other (See Comments)    Old paper tape caused a reaction, doesn't react to most other tapes     Chief Complaint  Patient presents with   Acute Visit    Care plan meeting.     HPI:  We have come together for her care plan. Family present. BIMS 3/15 mood 0/30. She is using wheelchair without falls. She requires moderate to max assist with her adls. She is frequently incontinent of bladder and bowel. Dietary supervision for meals; D3 chopped diet; appetite 1-100% weight is 126.4 pounds.therapy: ambulate 20 feet with rolling walker and contact guard; supervision for upper body; min to mod for lower body; min assist with transfers; brp: contact guard. She has  poor safety awareness.  Activities: will attend at times. She continues to be followed for her chronic illnesses including:   Aortic atherosclerosis    Cirrhosis of liver without ascites unspecified hepatic cirrhosis type    Major depression recurrent chronic  Past Medical History:  Diagnosis Date   Anemia    Anxiety    Aortic insufficiency    Moderate   Atrial fibrillation (HCC)    Back pain    Carotid stenosis, bilateral    Cirrhosis (HCC)    Closed left femoral fracture (HCC) 04/21/2017   Coronary artery disease    Stent x 2 RCA 1995, cardiac catheterization 09/2016 showing only mild atherosclerosis   DDD (degenerative disc disease), lumbar    Depression    Diabetes mellitus without complication (HCC)    Essential hypertension    Falls    GERD (gastroesophageal reflux disease)    Hiatal hernia    History of stroke    Hypercholesteremia    IBS (irritable bowel syndrome)    Non-alcoholic fatty liver disease    OSA (obstructive sleep apnea)    no CPAP   Pericardial effusion    a. small by echo 2018.   PSVT (paroxysmal  supraventricular tachycardia) (HCC)    Recurrent UTI    Seizures (HCC) 2018   Stroke (HCC) 07/2019   several strokes within the last 10 years   Swallowing difficulty    Varices, esophageal (HCC)     Past Surgical History:  Procedure Laterality Date   ANAL FISSURE REPAIR  1972   APPENDECTOMY     BACK SURGERY  1985   CARDIAC CATHETERIZATION  [phone]   Stent to the proximal RCA after MI    CARDIAC CATHETERIZATION N/A 09/26/2016   Procedure: Left Heart Cath and Coronary Angiography;  Surgeon: Marykay Lex, MD;  Location: [facility] INVASIVE CV LAB;  Service: Cardiovascular;  Laterality: N/A;   CATARACT EXTRACTION W/PHACO Right 07/04/2014   Procedure: CATARACT EXTRACTION PHACO AND INTRAOCULAR LENS PLACEMENT (IOC);  Surgeon: Loraine Leriche T. Nile Riggs, MD;  Location: [facility] ORS;  Service: Ophthalmology;  Laterality: Right;  CDE:13.13   COLONOSCOPY     DILATION AND CURETTAGE OF UTERUS     x2   Epi Retinal Membrane Peel Left    ERCP     ESOPHAGEAL BANDING N/A 04/01/2013   Procedure: ESOPHAGEAL BANDING;  Surgeon: Malissa Hippo, MD;  Location: [facility] ENDO SUITE;  Service: Endoscopy;  Laterality: N/A;   ESOPHAGEAL BANDING N/A 05/24/2013   Procedure: ESOPHAGEAL BANDING;  Surgeon: Malissa Hippo, MD;  Location: [facility];  Service:  Endoscopy;  Laterality: N/A;   ESOPHAGEAL BANDING N/A 06/21/2014   Procedure: ESOPHAGEAL BANDING;  Surgeon: Malissa Hippo, MD;  Location: [facility];  Service: Endoscopy;  Laterality: N/A;   ESOPHAGEAL BANDING  06/15/2020   Procedure: ESOPHAGEAL BANDING;  Surgeon: Dolores Frame, MD;  Location: [facility];  Service: Gastroenterology;;   ESOPHAGOGASTRODUODENOSCOPY N/A 04/01/2013   Procedure: ESOPHAGOGASTRODUODENOSCOPY (EGD);  Surgeon: Malissa Hippo, MD;  Location: [facility];  Service: Endoscopy;  Laterality: N/A;  230-rescheduled to 8:30am Ann notified pt   ESOPHAGOGASTRODUODENOSCOPY N/A 05/24/2013   Procedure: ESOPHAGOGASTRODUODENOSCOPY (EGD);  Surgeon:  Malissa Hippo, MD;  [facility]: [facility];  Service: Endoscopy;  Laterality: N/A;  730   ESOPHAGOGASTRODUODENOSCOPY N/A 06/21/2014   Procedure: ESOPHAGOGASTRODUODENOSCOPY (EGD);  Surgeon: Malissa Hippo, MD;  [facility];  Service: Endoscopy;  Laterality: N/A;  930-rescheduled 10/14 @ 1200 Ann to notify pt   ESOPHAGOGASTRODUODENOSCOPY (EGD) WITH PROPOFOL N/A 06/15/2020   Procedure: ESOPHAGOGASTRODUODENOSCOPY (EGD) WITH PROPOFOL;  Surgeon: Dolores Frame, MD;  [facility];  Service: Gastroenterology;  Laterality: N/A;  230   ESOPHAGOGASTRODUODENOSCOPY (EGD) WITH PROPOFOL N/A 07/27/2020   Procedure: ESOPHAGOGASTRODUODENOSCOPY (EGD) WITH PROPOFOL;  Surgeon: Dolores Frame, MD;  [facility];  Service: Gastroenterology;  Laterality: N/A;  1245   ESOPHAGOGASTRODUODENOSCOPY (EGD) WITH PROPOFOL N/A 08/19/2022   Procedure: ESOPHAGOGASTRODUODENOSCOPY (EGD) WITH PROPOFOL;  Surgeon: Dolores Frame, MD;  [facility]: [facility];  Service: Gastroenterology;  Laterality: N/A;  130 ASA 3, pt knows to arrive at 6:15   EYE SURGERY  08   cataract surgery of the left eye   FEMUR IM NAIL Left 03/02/2017   Procedure: INTRAMEDULLARY (IM) NAIL FEMORAL;  Surgeon: Samson Frederic, MD;  Location: [facility] OR;  Service: Orthopedics;  Laterality: Left;   HARDWARE REMOVAL Right 01/17/2013   Procedure: REMOVAL OF HARDWARE AND EXCISION ULNAR STYLOID RIGHT WRIST;  Surgeon: Tami Ribas, MD;  Location: [facility];  Service: Orthopedics;  Laterality: Right;   implanted heart monitor  02/2020   to monitor Afib   IR 3D INDEPENDENT WKST  09/26/2022   IR ANGIOGRAM SELECTIVE EACH ADDITIONAL VESSEL  09/26/2022   IR ANGIOGRAM SELECTIVE EACH ADDITIONAL VESSEL  09/26/2022   IR ANGIOGRAM SELECTIVE EACH ADDITIONAL VESSEL  09/26/2022   IR ANGIOGRAM SELECTIVE EACH ADDITIONAL VESSEL  09/26/2022   IR ANGIOGRAM SELECTIVE EACH ADDITIONAL VESSEL  09/26/2022   IR ANGIOGRAM  SELECTIVE EACH ADDITIONAL VESSEL  10/10/2022   IR ANGIOGRAM SELECTIVE EACH ADDITIONAL VESSEL  10/10/2022   IR ANGIOGRAM SELECTIVE EACH ADDITIONAL VESSEL  10/10/2022   IR ANGIOGRAM VISCERAL SELECTIVE  09/26/2022   IR ANGIOGRAM VISCERAL SELECTIVE  10/10/2022   IR EMBO TUMOR ORGAN ISCHEMIA INFARCT INC GUIDE ROADMAPPING  10/10/2022   IR INTRAVASCULAR ULTRASOUND NON CORONARY  09/26/2022   IR RADIOLOGIST EVAL & MGMT  09/10/2022   IR RADIOLOGIST EVAL & MGMT  11/03/2022   IR RADIOLOGIST EVAL & MGMT  02/10/2023   IR RADIOLOGIST EVAL & MGMT  05/15/2023   IR TRANSCATH PLC STENT 1ST ART NOT LE CV CAR VERT CAR  09/26/2022   IR US GUIDE VASC ACCESS RIGHT  09/26/2022   IR US GUIDE VASC ACCESS RIGHT  10/10/2022   MYRINGOTOMY  2012   both ears   ORIF FEMUR FRACTURE Left 04/23/2017   Procedure: OPEN REDUCTION INTERNAL FIXATION (ORIF) DISTAL FEMUR FRACTURE (FRACTURE AROUND FEMORAL NAIL);  Surgeon: Myrene Galas, MD;  Location: [facility] OR;  Service: Orthopedics;  Laterality: Left;   TONSILLECTOMY     TUBAL LIGATION     VAGINAL HYSTERECTOMY  1972   WRIST SURGERY     rt wrist hardwear removal    Social History   Socioeconomic History   Marital status: Widowed    Spouse name: Not on file   Number of children: 2   Years of education: 2 years college   Highest education level: Some college, no degree  Occupational History    Employer: RETIRED  Tobacco Use   Smoking status: Former    Current packs/day: 0.00    Average packs/day: 0.3 packs/day for 50.0 years (12.5 ttl pk-yrs)    Types: Cigarettes    Start date: 07/09/1972    Quit date: 07/09/2022    Years since quitting: 1.0    Passive exposure: Current   Smokeless tobacco: Never   Tobacco comments:    12/23/21 1 pack Q two weeks.  Vaping Use   Vaping status: Never Used  Substance and Sexual Activity   Alcohol use: No    Alcohol/week: 0.0 standard drinks of alcohol   Drug use: No   Sexual activity: Not Currently    Birth control/protection: Surgical  Other Topics  Concern   Not on file  Social History Narrative   12/23/21  Lives with 3 grandchildren, sone and dgtr-in -law are across the st   Social Determinants of Health   Financial Resource Strain: Low Risk  (09/15/2022)   Overall Financial Resource Strain (CARDIA)    Difficulty of Paying Living Expenses: Not hard at all  Food Insecurity: No Food Insecurity (07/31/2023)   Hunger Vital Sign    Worried About Running Out of Food in the Last Year: Never true    Ran Out of Food in the Last Year: Never true  Transportation Needs: No Transportation Needs (07/31/2023)   PRAPARE - Administrator, Civil Service (Medical): No    Lack of Transportation (Non-Medical): No  Physical Activity: Inactive (03/16/2019)   Exercise Vital Sign    Days of Exercise per Week: 0 days    Minutes of Exercise per Session: 0 min  Stress: No Stress Concern Present (03/16/2019)   [employer] of Occupational Health - Occupational Stress Questionnaire    Feeling of Stress : Not at all  Social Connections: Moderately Isolated (03/16/2019)   Social Connection and Isolation Panel [NHANES]    Frequency of Communication with Friends and Family: More than three times a week    Frequency of Social Gatherings with Friends and Family: Never    Attends Religious Services: 1 to 4 times per year    Active Member of [employer] or Organizations: No    Attends Banker Meetings: Never    Marital Status: Widowed  Intimate Partner Violence: Not At Risk (07/31/2023)   Humiliation, Afraid, Rape, and Kick questionnaire    Fear of Current or Ex-Partner: No    Emotionally Abused: No    Physically Abused: No    Sexually Abused: No   Family History  Problem Relation Age of Onset   Diabetes Mother    Heart failure Father    Heart failure Maternal Aunt       VITAL SIGNS BP (!) 122/50   Pulse (!) 56   Temp (!) 97.4 F (36.3 C)   Resp 20   Ht 5\' 2"  (1.575 m)   Wt 124 lb 9.6 oz (56.5 kg)   SpO2 97%   BMI 22.79 kg/m    Outpatient  Encounter Medications as of 08/14/2023  Medication Sig   acetaminophen (TYLENOL) 500 MG tablet Take 1,000 mg by mouth at bedtime.   aspirin EC 81 MG tablet Take 1 tablet (81 mg total) by mouth daily. Swallow whole.   atorvastatin (LIPITOR) 80 MG tablet Take 1 tablet (80 mg total) by mouth at bedtime.   Calcium Carbonate 500 (200 Ca) MG WAFR Take 1 tablet by mouth in the morning and at bedtime.   CALCIUM CITRATE PO Take 200 mg by mouth 2 (two) times daily.   carvedilol (COREG) 3.125 MG tablet Take 3.125 mg by mouth 2 (two) times daily with a meal.   Cholecalciferol (VITAMIN D3) 50 MCG (2000 UT) TABS Take 2,000 Units by mouth daily.   Coenzyme Q10 (CO Q-10) 100 MG CAPS Take 100 mg by mouth in the morning.   dabigatran (PRADAXA) 150 MG CAPS capsule Take 1 capsule (150 mg total) by mouth every 12 (twelve) hours.   diltiazem (CARDIZEM CD) 240 MG 24 hr capsule Take 1 capsule (240 mg total) by mouth daily.   doxepin (SINEQUAN) 25 MG capsule Take 25 mg by mouth at bedtime.   furosemide (LASIX) 40 MG tablet Take 1 tablet (40 mg total) by mouth 2 (two) times daily.   lactulose (CHRONULAC) 10 GM/15ML solution Take 40 g by mouth 3 (three) times daily.   Multiple Vitamin (MULTIVITAMIN WITH MINERALS) TABS tablet Take 1 tablet by mouth daily.   omeprazole (PRILOSEC) 40 MG capsule TAKE (1) CAPSULE BY MOUTH ONCE DAILY.   potassium chloride SA (KLOR-CON M) 20 MEQ tablet Take 20 mEq by mouth 3 (three) times daily.   Protein (PROSOURCE PO) Take 30 mLs by mouth 3 (three) times daily.   Facility-Administered Encounter Medications as of 08/14/2023  Medication   dexamethasone (DECADRON) injection 8 mg     SIGNIFICANT DIAGNOSTIC EXAMS  LABS REVIEWED   07-30-23: wbc 6.4; hgb 13.3; hct 39.8; mcv 95.0 plt 137; glucose 157; bun 14; creat 1.09; k+ 3.1; na++ 138; ca 8.7; gfr 51; total bili 3.8; protein 6.2; albumin 2.9; hgb A1c 5.3; blood culture: NG 07-31-23: chol 119; ldl 77; trig 50; hdl 32; mag  1.7 08-03-23: wbc 6.6; hgb 11.7; hct 36.0; mcv 95.0 plt 106; glucose 86; bun 12; creat 1.10 ;k+ 3.3; na++ 136; ca 7.7; gfr 51; ammonia 49  08-10-23: wbc 5.4; hgb 11.6; hct 33.9; mcv 94.2 plt 138; glucose 108; bun 15; creat 1.28 k+ 3.2; na++ 134; ca 8.9; gfr 42 ammonia 52; phos 3.2   NO NEW LABS.    Review of Systems  Constitutional:  Negative for malaise/fatigue.  Respiratory:  Negative for cough and shortness of breath.   Cardiovascular:  Negative for chest pain, palpitations and leg swelling.  Gastrointestinal:  Negative for abdominal pain, constipation and heartburn.  Musculoskeletal:  Negative for back pain, joint pain and myalgias.  Skin: Negative.   Neurological:  Negative for dizziness.  Psychiatric/Behavioral:  The patient is not nervous/anxious.    Physical Exam Constitutional:      General: She is not in acute distress.    Appearance: She is well-developed. She is not diaphoretic.  Neck:     Thyroid: No thyromegaly.     Vascular: Carotid bruit present.  Cardiovascular:     Rate and Rhythm: Normal rate and regular rhythm.     Pulses: Normal pulses.     Heart sounds: Murmur heard.  Pulmonary:     Effort: Pulmonary effort is normal. No respiratory distress.  Breath sounds: Normal breath sounds.  Abdominal:     General: Bowel sounds are normal. There is no distension.     Palpations: Abdomen is soft.     Tenderness: There is no abdominal tenderness.  Musculoskeletal:        General: Normal range of motion.     Cervical back: Neck supple.     Right lower leg: Edema present.     Left lower leg: Edema present.  Lymphadenopathy:     Cervical: No cervical adenopathy.  Skin:    General: Skin is warm and dry.  Neurological:     Mental Status: She is alert. Mental status is at baseline.  Psychiatric:        Mood and Affect: Mood normal.    ASSESSMENT/ PLAN:  TODAY  Aortic atherosclerosis Cirrhosis of liver without ascites unspecified hepatic cirrhosis  type Major depression recurrent chronic  Will continue current medications Will continue therapy as directed Will continue to monitor her status.   Her goal is to return home in the next 1-2 weeks  Time spent with patient 40 minutes: therapy; medications; dietary    Synthia Innocent NP [facility]  call [phone]

## 2023-08-14 NOTE — Patient Instructions (Signed)
Medication Instructions:  Your physician has recommended you make the following change in your medication:   -Decrease Lasix to 40 mg once daily  *If you need a refill on your cardiac medications before your next appointment, please call your pharmacy*   Lab Work: None If you have labs (blood work) drawn today and your tests are completely normal, you will receive your results only by: MyChart Message (if you have MyChart) OR A paper copy in the mail If you have any lab test that is abnormal or we need to change your treatment, we will call you to review the results.   Testing/Procedures: None   Follow-Up: At [facility], you and your health needs are our priority.  As part of our continuing mission to provide you with exceptional heart care, we have created designated Provider Care Teams.  These Care Teams include your primary Cardiologist (physician) and Advanced Practice Providers (APPs -  Physician Assistants and Nurse Practitioners) who all work together to provide you with the care you need, when you need it.  We recommend signing up for the patient portal called "MyChart".  Sign up information is provided on this After Visit Summary.  MyChart is used to connect with patients for Virtual Visits (Telemedicine).  Patients are able to view lab/test results, encounter notes, upcoming appointments, etc.  Non-urgent messages can be sent to your provider as well.   To learn more about what you can do with MyChart, go to [email or web address].    Your next appointment:   3-4 month(s)  Provider:   You may see Dina Rich, MD or one of the following Advanced Practice Providers on your designated Care Team:   Randall An, PA-C  Jacolyn Reedy, [place]     Other Instructions Please start wearing knee high compression stockings.

## 2023-08-14 NOTE — Progress Notes (Unsigned)
Cardiology Office Note    Date:  08/15/2023  ID:  [name], DOB Nov 19, 1940, MRN [id number] Cardiologist: Dina Rich, MD    History of Present Illness:    [name] is a 82 y.o. female  with past medical history of persistent atrial fibrillation, SVT, CAD (prior stents to the RCA in 1995 with catheterization in 09/2016 showing nonobstructive disease), carotid artery stenosis, aortic stenosis, cirrhosis, hepatocellular carcinoma, HTN, HLD and prior CVA who presents to the office today for 29-month follow-up.  She was examined by myself in 06/2023 and reported worsening lower extremity edema over the past several weeks and was mostly wheelchair-bound and did not elevate her legs routinely. Her heart rate was elevated at the time of her office visit but she had not yet taken Coreg or Cardizem CD. She was continued on Coreg 6.25 mg twice daily and Cardizem CD 180 mg daily and was encouraged to follow her heart rate at home. Most recent echocardiogram in 2023 had shown severe aortic stenosis with low-flow low gradient and it was felt she would likely not be a candidate for AVR given her comorbidities and she was not keen on pursuing this at that time. She did report back with her heart rate and it was still elevated in the low 100's at rest and was recommended to increase Cardizem CD from 180 mg daily to 240 mg daily.  In the interim, she was admitted to [facility] in 07/2023 for an acute ischemic CVA. Brain MRI showed a 3.4 cm acute involving the anterior left insula and overlying operculum with the left MCA distribution and no associated hemorrhage or mass effect. Also noted to have probable acute left M3 occlusion. tPA was not administered as she was outside the window for this. Her event was felt to be cardioembolic and given that she had been on Eliquis and reported compliance with this, Neurology did recommend switching to Pradaxa 150 mg twice daily if covered by her insurance. Was  discharged to the [facility] for rehab.  In talking with the patient and her family member today, she reports she has been working with therapy while at the [facility]. She does mostly sit in wheelchair throughout the day with her legs down and has experienced worsening edema over the past few weeks. By review of her MAR, she was discharged on Lasix 40 mg twice daily and previously on 40 mg once daily. She denies any recent dyspnea on exertion, orthopnea or PND. No recent chest pain or palpitations.  Studies Reviewed:   EKG: EKG is not ordered today.  Echocardiogram: 07/2023 IMPRESSIONS     1. Left ventricular ejection fraction, by estimation, is >75%. The left  ventricle has hyperdynamic function. The left ventricle has no regional  wall motion abnormalities. Left ventricular diastolic parameters are  indeterminate.   2. Right ventricular systolic function is normal. The right ventricular  size is normal. Tricuspid regurgitation signal is inadequate for assessing  PA pressure.   3. Left atrial size was severely dilated.   4. The mitral valve is abnormal. Mild mitral valve regurgitation.  Moderate mitral stenosis. The mean mitral valve gradient is 7.5 mmHg.  Severe mitral annular calcification.   5. The aortic valve was not well visualized. Aortic valve regurgitation  is moderate to severe. Suboptimal suprasternal window for quantification  of AR. Visually appears to be severe AR but moderate AR by PHT of 341  msec. Moderate to severe aortic valve  stenosis. Aortic valve  area, by VTI measures 0.77 cm. Aortic valve mean  gradient measures 25.0 mmHg. Aortic valve Vmax measures 3.37 m/s. DVI is  0.30.   6. The inferior vena cava is normal in size with greater than 50%  respiratory variability, suggesting right atrial pressure of 3 mmHg.   Carotid Dopplers: 07/2023 IMPRESSION: Moderate calcified plaque of the internal carotid artery origins bilaterally with Doppler measurements  suggestive of 50-69% stenosis.  Risk Assessment/Calculations:    CHA2DS2-VASc Score = 7   This indicates a 11.2% annual risk of stroke. The patient's score is based upon: CHF History: 0 HTN History: 1 Diabetes History: 0 Stroke History: 2 Vascular Disease History: 1 Age Score: 2 Gender Score: 1     Physical Exam:   VS:  BP (!) 104/56   Pulse (!) 58   Ht 5\' 2"  (1.575 m)   Wt 120 lb 12.8 oz (54.8 kg)   SpO2 99%   BMI 22.09 kg/m    Wt Readings from Last 3 Encounters:  08/14/23 120 lb 12.8 oz (54.8 kg)  08/14/23 124 lb 9.6 oz (56.5 kg)  08/10/23 126 lb 6.4 oz (57.3 kg)     GEN: Frail, elderly female appearing in no acute distress NECK: No JVD; No carotid bruits CARDIAC: Irregular irregular, 3/6 SEM along RUSB.  RESPIRATORY:  Clear to auscultation without rales, wheezing or rhonchi  ABDOMEN: Appears non-distended. No obvious abdominal masses. EXTREMITIES: No clubbing or cyanosis.  1+ pitting edema bilaterally.  Distal pedal pulses are 2+ bilaterally.   Assessment and Plan:   1.  Aortic stenosis/aortic regurgitation - Repeat echocardiogram in 07/2023 showed moderate to severe aortic valve stenosis with moderate to severe aortic valve regurgitation. Given her multiple medical issues and poor functional status, she would likely not be a candidate for AVR and in discussion with the patient and her family today, she is not interested in undergoing any type of workup for this which is certainly reasonable. She has been taking Lasix 40 mg twice daily and given her rising creatinine (creatinine previously 0.78 in 07/2023 and now up to 1.55 by most recent check on 08/13/2023), will reduce Lasix to her prior dose of 40 mg daily. Will also write for her to have compression hose placed at SNF. Also recommended that she elevate her lower extremities when able.  2. Persistent Atrial Fibrillation - Continue current medical therapy with Coreg 3.125 mg twice daily and Cardizem CD 240 mg daily  for now. Her HR has typically been in the 60's to 70's at SNF but if staying persistently in the 50's going forward, would reduce Cardizem CD to 180mg  daily.  - No reports of active bleeding. She was switched from Eliquis to Pradaxa during her recent admission given concerns for embolic stroke while on Eliquis. She has remained on ASA and Pradaxa per Neurology.  3. CAD - She underwent stenting to the RCA in 1995 and cardiac catheterization in 2018 showed nonobstructive disease. Her activity is limited as she is mostly wheelchair-bound but denies any recent anginal symptoms. - Continue ASA 81 mg daily, Atorvastatin 80 mg daily and Coreg 3.125mg  BID.   4. HTN - Her blood pressure is well-controlled at 104/56 during today's visit. Continue Cardizem CD 240 mg daily and Coreg 3.125 mg twice daily.  5. HLD - Followed by her PCP. Continue current medical therapy with Atorvastatin 80 mg daily.  6. Hepatocellular Carcinoma - Followed by GI and Oncology.  Signed, Ellsworth Lennox, PA-C

## 2023-08-15 ENCOUNTER — Encounter: Payer: Self-pay | Admitting: Student

## 2023-08-18 ENCOUNTER — Ambulatory Visit: Payer: Medicare Other | Admitting: Hematology

## 2023-08-18 ENCOUNTER — Ambulatory Visit (HOSPITAL_COMMUNITY)
Admission: RE | Admit: 2023-08-18 | Discharge: 2023-08-18 | Disposition: A | Discharge status: Home / Self Care | Payer: Medicare Other | Source: Ambulatory Visit | Attending: Hematology | Admitting: Hematology

## 2023-08-18 ENCOUNTER — Other Ambulatory Visit: Payer: Self-pay | Admitting: Hematology

## 2023-08-18 DIAGNOSIS — C220 Liver cell carcinoma: Secondary | ICD-10-CM

## 2023-08-18 DIAGNOSIS — R1900 Intra-abdominal and pelvic swelling, mass and lump, unspecified site: Secondary | ICD-10-CM | POA: Diagnosis not present

## 2023-08-18 DIAGNOSIS — K769 Liver disease, unspecified: Secondary | ICD-10-CM | POA: Diagnosis not present

## 2023-08-18 DIAGNOSIS — K8020 Calculus of gallbladder without cholecystitis without obstruction: Secondary | ICD-10-CM | POA: Diagnosis not present

## 2023-08-18 DIAGNOSIS — C23 Malignant neoplasm of gallbladder: Secondary | ICD-10-CM | POA: Diagnosis not present

## 2023-08-18 NOTE — Progress Notes (Signed)
Could not get a IV on patient.  Patient did not get contrast.  If contrast is needed patient Will need a line placed prior to MRI.

## 2023-08-19 ENCOUNTER — Other Ambulatory Visit: Payer: Self-pay | Admitting: Adult Health

## 2023-08-19 ENCOUNTER — Encounter: Payer: Self-pay | Admitting: Adult Health

## 2023-08-19 ENCOUNTER — Non-Acute Institutional Stay (SKILLED_NURSING_FACILITY): Payer: Medicare Other | Admitting: Adult Health

## 2023-08-19 DIAGNOSIS — I129 Hypertensive chronic kidney disease with stage 1 through stage 4 chronic kidney disease, or unspecified chronic kidney disease: Secondary | ICD-10-CM

## 2023-08-19 DIAGNOSIS — I4819 Other persistent atrial fibrillation: Secondary | ICD-10-CM | POA: Diagnosis not present

## 2023-08-19 DIAGNOSIS — N1831 Chronic kidney disease, stage 3a: Secondary | ICD-10-CM | POA: Diagnosis not present

## 2023-08-19 DIAGNOSIS — E1122 Type 2 diabetes mellitus with diabetic chronic kidney disease: Secondary | ICD-10-CM | POA: Diagnosis not present

## 2023-08-19 DIAGNOSIS — Z8673 Personal history of transient ischemic attack (TIA), and cerebral infarction without residual deficits: Secondary | ICD-10-CM | POA: Diagnosis not present

## 2023-08-19 DIAGNOSIS — K7460 Unspecified cirrhosis of liver: Secondary | ICD-10-CM | POA: Diagnosis not present

## 2023-08-19 MED ORDER — DOXEPIN HCL 25 MG PO CAPS: 25.0000 mg | ORAL_CAPSULE | Freq: Every day | ORAL | 0 refills | Status: DC

## 2023-08-19 MED ORDER — OMEPRAZOLE 40 MG PO CPDR: 40.0000 mg | DELAYED_RELEASE_CAPSULE | Freq: Every day | ORAL | 0 refills | Status: DC

## 2023-08-19 MED ORDER — DILTIAZEM HCL ER COATED BEADS 240 MG PO CP24: 240.0000 mg | ORAL_CAPSULE | Freq: Every day | ORAL | 0 refills | Status: DC

## 2023-08-19 MED ORDER — LACTULOSE 10 GM/15ML PO SOLN: 40.0000 g | Freq: Three times a day (TID) | ORAL | 0 refills | Status: DC

## 2023-08-19 MED ORDER — DABIGATRAN ETEXILATE MESYLATE 150 MG PO CAPS: 150.0000 mg | ORAL_CAPSULE | Freq: Two times a day (BID) | ORAL | 0 refills | Status: DC

## 2023-08-19 MED ORDER — ATORVASTATIN CALCIUM 80 MG PO TABS: 80.0000 mg | ORAL_TABLET | Freq: Every day | ORAL | 0 refills | Status: DC

## 2023-08-19 MED ORDER — POTASSIUM CHLORIDE CRYS ER 20 MEQ PO TBCR: 20.0000 meq | EXTENDED_RELEASE_TABLET | Freq: Three times a day (TID) | ORAL | 0 refills | Status: DC

## 2023-08-19 MED ORDER — FUROSEMIDE 40 MG PO TABS: 40.0000 mg | ORAL_TABLET | Freq: Every day | ORAL | 0 refills | Status: DC

## 2023-08-19 MED ORDER — PROSOURCE PO LIQD: 30.0000 mL | Freq: Three times a day (TID) | ORAL | 0 refills | Status: DC

## 2023-08-19 MED ORDER — CARVEDILOL 3.125 MG PO TABS: 3.1250 mg | ORAL_TABLET | Freq: Two times a day (BID) | ORAL | 0 refills | Status: DC

## 2023-08-19 NOTE — Progress Notes (Signed)
Location:  [facility] Room Number: 128-P Place of Service:  SNF (31)   CODE STATUS: Full Code  Allergies  Allergen Reactions   Tape Rash and Other (See Comments)    Old paper tape caused a reaction, doesn't react to most other tapes     Chief Complaint  Patient presents with   Discharge Note    HPI:  She is being discharged to home with home health for pt/ot. She will need her prescriptions written and will need to follow up with her medical provider. She does not need dme. 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 Had bilateral pleural effusions requiring a right thoracentesis with 1.3 L return. She has a history of hepatocellular carcinoma with liver cirrhosis. She was admitted to this facility for short term rehab: ambulate 30 feet with rolling walker and contact guard assist; upper body min assist lower body mod assist; brp: min assist. She is confused and echos back communication.    Past Medical History:  Diagnosis Date   Anemia    Anxiety    Aortic insufficiency    Moderate   Atrial fibrillation (HCC)    Back pain    Carotid stenosis, bilateral    Cirrhosis (HCC)    Closed left femoral fracture (HCC) 04/21/2017   Coronary artery disease    Stent x 2 RCA 1995, cardiac catheterization 09/2016 showing only mild atherosclerosis   DDD (degenerative disc disease), lumbar    Depression    Diabetes mellitus without complication (HCC)    Essential hypertension    Falls    GERD (gastroesophageal reflux disease)    Hiatal hernia    History of stroke    Hypercholesteremia    IBS (irritable bowel syndrome)    Non-alcoholic fatty liver disease    OSA (obstructive sleep apnea)    no CPAP   Pericardial effusion    a. small by echo 2018.   PSVT (paroxysmal supraventricular tachycardia) (HCC)    Recurrent UTI    Seizures (HCC) 2018   Stroke (HCC) 07/2019   several strokes within the last 10 years   Swallowing difficulty     Varices, esophageal (HCC)     Past Surgical History:  Procedure Laterality Date   ANAL FISSURE REPAIR  1972   APPENDECTOMY     BACK SURGERY  1985   CARDIAC CATHETERIZATION  [phone]   Stent to the proximal RCA after MI    CARDIAC CATHETERIZATION N/A 09/26/2016   Procedure: Left Heart Cath and Coronary Angiography;  Surgeon: Marykay Lex, MD;  Location: [facility] INVASIVE CV LAB;  Service: Cardiovascular;  Laterality: N/A;   CATARACT EXTRACTION W/PHACO Right 07/04/2014   Procedure: CATARACT EXTRACTION PHACO AND INTRAOCULAR LENS PLACEMENT (IOC);  Surgeon: Loraine Leriche T. Nile Riggs, MD;  Location: [facility];  Service: Ophthalmology;  Laterality: Right;  CDE:13.13   COLONOSCOPY     DILATION AND CURETTAGE OF UTERUS     x2   Epi Retinal Membrane Peel Left    ERCP     ESOPHAGEAL BANDING N/A 04/01/2013   Procedure: ESOPHAGEAL BANDING;  Surgeon: Malissa Hippo, MD;  Location: [facility];  Service: Endoscopy;  Laterality: N/A;   ESOPHAGEAL BANDING N/A 05/24/2013   Procedure: ESOPHAGEAL BANDING;  Surgeon: Malissa Hippo, MD;  Location: [facility];  Service: Endoscopy;  Laterality: N/A;   ESOPHAGEAL BANDING N/A 06/21/2014   Procedure: ESOPHAGEAL BANDING;  Surgeon: Malissa Hippo, MD;  [facility]: [facility];  Service: Endoscopy;  Laterality: N/A;   ESOPHAGEAL BANDING  06/15/2020   Procedure: ESOPHAGEAL BANDING;  Surgeon: Dolores Frame, MD;  Location: [facility];  Service: Gastroenterology;;   ESOPHAGOGASTRODUODENOSCOPY N/A 04/01/2013   Procedure: ESOPHAGOGASTRODUODENOSCOPY (EGD);  Surgeon: Malissa Hippo, MD;  [facility]: [facility];  Service: Endoscopy;  Laterality: N/A;  230-rescheduled to 8:30am Ann notified pt   ESOPHAGOGASTRODUODENOSCOPY N/A 05/24/2013   Procedure: ESOPHAGOGASTRODUODENOSCOPY (EGD);  Surgeon: Malissa Hippo, MD;  [facility]: [facility];  Service: Endoscopy;  Laterality: N/A;  730   ESOPHAGOGASTRODUODENOSCOPY N/A 06/21/2014   Procedure:  ESOPHAGOGASTRODUODENOSCOPY (EGD);  Surgeon: Malissa Hippo, MD;  [facility];  Service: Endoscopy;  Laterality: N/A;  930-rescheduled 10/14 @ 1200 Ann to notify pt   ESOPHAGOGASTRODUODENOSCOPY (EGD) WITH PROPOFOL N/A 06/15/2020   Procedure: ESOPHAGOGASTRODUODENOSCOPY (EGD) WITH PROPOFOL;  Surgeon: Dolores Frame, MD;  [facility];  Service: Gastroenterology;  Laterality: N/A;  230   ESOPHAGOGASTRODUODENOSCOPY (EGD) WITH PROPOFOL N/A 07/27/2020   Procedure: ESOPHAGOGASTRODUODENOSCOPY (EGD) WITH PROPOFOL;  Surgeon: Dolores Frame, MD;  [facility]: [facility];  Service: Gastroenterology;  Laterality: N/A;  1245   ESOPHAGOGASTRODUODENOSCOPY (EGD) WITH PROPOFOL N/A 08/19/2022   Procedure: ESOPHAGOGASTRODUODENOSCOPY (EGD) WITH PROPOFOL;  Surgeon: Dolores Frame, MD;  [facility]: [facility];  Service: Gastroenterology;  Laterality: N/A;  130 ASA 3, pt knows to arrive at 6:15   EYE SURGERY  08   cataract surgery of the left eye   FEMUR IM NAIL Left 03/02/2017   Procedure: INTRAMEDULLARY (IM) NAIL FEMORAL;  Surgeon: Samson Frederic, MD;  Location: [facility];  Service: Orthopedics;  Laterality: Left;   HARDWARE REMOVAL Right 01/17/2013   Procedure: REMOVAL OF HARDWARE AND EXCISION ULNAR STYLOID RIGHT WRIST;  Surgeon: Tami Ribas, MD;  Location: [facility];  Service: Orthopedics;  Laterality: Right;   implanted heart monitor  02/2020   to monitor Afib   IR 3D INDEPENDENT WKST  09/26/2022   IR ANGIOGRAM SELECTIVE EACH ADDITIONAL VESSEL  09/26/2022   IR ANGIOGRAM SELECTIVE EACH ADDITIONAL VESSEL  09/26/2022   IR ANGIOGRAM SELECTIVE EACH ADDITIONAL VESSEL  09/26/2022   IR ANGIOGRAM SELECTIVE EACH ADDITIONAL VESSEL  09/26/2022   IR ANGIOGRAM SELECTIVE EACH ADDITIONAL VESSEL  09/26/2022   IR ANGIOGRAM SELECTIVE EACH ADDITIONAL VESSEL  10/10/2022   IR ANGIOGRAM SELECTIVE EACH ADDITIONAL VESSEL  10/10/2022   IR ANGIOGRAM SELECTIVE EACH ADDITIONAL  VESSEL  10/10/2022   IR ANGIOGRAM VISCERAL SELECTIVE  09/26/2022   IR ANGIOGRAM VISCERAL SELECTIVE  10/10/2022   IR EMBO TUMOR ORGAN ISCHEMIA INFARCT INC GUIDE ROADMAPPING  10/10/2022   IR INTRAVASCULAR ULTRASOUND NON CORONARY  09/26/2022   IR RADIOLOGIST EVAL & MGMT  09/10/2022   IR RADIOLOGIST EVAL & MGMT  11/03/2022   IR RADIOLOGIST EVAL & MGMT  02/10/2023   IR RADIOLOGIST EVAL & MGMT  05/15/2023   IR TRANSCATH PLC STENT 1ST ART NOT LE CV CAR VERT CAR  09/26/2022   IR US GUIDE VASC ACCESS RIGHT  09/26/2022   IR US GUIDE VASC ACCESS RIGHT  10/10/2022   MYRINGOTOMY  2012   both ears   ORIF FEMUR FRACTURE Left 04/23/2017   Procedure: OPEN REDUCTION INTERNAL FIXATION (ORIF) DISTAL FEMUR FRACTURE (FRACTURE AROUND FEMORAL NAIL);  Surgeon: Myrene Galas, MD;  Location: [facility] OR;  Service: Orthopedics;  Laterality: Left;   TONSILLECTOMY     TUBAL LIGATION     VAGINAL HYSTERECTOMY  1972   WRIST SURGERY  rt wrist hardwear removal    Social History   Socioeconomic History   Marital status: Widowed    Spouse name: Not on file   Number of children: 2   Years of education: 2 years college   Highest education level: Some college, no degree  Occupational History    Employer: RETIRED  Tobacco Use   Smoking status: Former    Current packs/day: 0.00    Average packs/day: 0.3 packs/day for 50.0 years (12.5 ttl pk-yrs)    Types: Cigarettes    Start date: 07/09/1972    Quit date: 07/09/2022    Years since quitting: 1.1    Passive exposure: Current   Smokeless tobacco: Never   Tobacco comments:    12/23/21 1 pack Q two weeks.  Vaping Use   Vaping status: Never Used  Substance and Sexual Activity   Alcohol use: No    Alcohol/week: 0.0 standard drinks of alcohol   Drug use: No   Sexual activity: Not Currently    Birth control/protection: Surgical  Other Topics Concern   Not on file  Social History Narrative   12/23/21  Lives with 3 grandchildren, sone and dgtr-in -law are across the st   Social  Determinants of Health   Financial Resource Strain: Low Risk  (09/15/2022)   Overall Financial Resource Strain (CARDIA)    Difficulty of Paying Living Expenses: Not hard at all  Food Insecurity: No Food Insecurity (07/31/2023)   Hunger Vital Sign    Worried About Running Out of Food in the Last Year: Never true    Ran Out of Food in the Last Year: Never true  Transportation Needs: No Transportation Needs (07/31/2023)   PRAPARE - Administrator, Civil Service (Medical): No    Lack of Transportation (Non-Medical): No  Physical Activity: Inactive (03/16/2019)   Exercise Vital Sign    Days of Exercise per Week: 0 days    Minutes of Exercise per Session: 0 min  Stress: No Stress Concern Present (03/16/2019)   [employer] of Occupational Health - Occupational Stress Questionnaire    Feeling of Stress : Not at all  Social Connections: Moderately Isolated (03/16/2019)   Social Connection and Isolation Panel [NHANES]    Frequency of Communication with Friends and Family: More than three times a week    Frequency of Social Gatherings with Friends and Family: Never    Attends Religious Services: 1 to 4 times per year    Active Member of [employer] or Organizations: No    Attends Banker Meetings: Never    Marital Status: Widowed  Intimate Partner Violence: Not At Risk (07/31/2023)   Humiliation, Afraid, Rape, and Kick questionnaire    Fear of Current or Ex-Partner: No    Emotionally Abused: No    Physically Abused: No    Sexually Abused: No   Family History  Problem Relation Age of Onset   Diabetes Mother    Heart failure Father    Heart failure Maternal Aunt       VITAL SIGNS BP (!) 114/54   Pulse (!) 58   Temp 98.2 F (36.8 C)   Resp 20   Ht 4\' 8"  (1.422 m)   Wt 118 lb 12.8 oz (53.9 kg)   SpO2 97%   BMI 26.63 kg/m   Outpatient Encounter Medications as of 08/19/2023  Medication Sig   acetaminophen (TYLENOL) 500 MG tablet Take 1,000 mg by mouth at  bedtime.   aspirin EC 81 MG  tablet Take 1 tablet (81 mg total) by mouth daily. Swallow whole.   atorvastatin (LIPITOR) 80 MG tablet Take 1 tablet (80 mg total) by mouth at bedtime.   Calcium Carbonate 500 (200 Ca) MG WAFR Take 1 tablet by mouth in the morning and at bedtime.   CALCIUM CITRATE PO Take 200 mg by mouth 2 (two) times daily.   carvedilol (COREG) 3.125 MG tablet Take 1 tablet (3.125 mg total) by mouth 2 (two) times daily with a meal.   Cholecalciferol (VITAMIN D3) 50 MCG (2000 UT) TABS Take 2,000 Units by mouth daily.   Coenzyme Q10 (CO Q-10) 100 MG CAPS Take 100 mg by mouth in the morning.   dabigatran (PRADAXA) 150 MG CAPS capsule Take 1 capsule (150 mg total) by mouth every 12 (twelve) hours.   diltiazem (CARDIZEM CD) 240 MG 24 hr capsule Take 1 capsule (240 mg total) by mouth daily.   doxepin (SINEQUAN) 25 MG capsule Take 1 capsule (25 mg total) by mouth at bedtime.   furosemide (LASIX) 40 MG tablet Take 1 tablet (40 mg total) by mouth daily.   lactulose (CHRONULAC) 10 GM/15ML solution Take 60 mLs (40 g total) by mouth 3 (three) times daily.   Multiple Vitamins-Minerals (SENTRY SENIOR/LUTEIN) TABS Take by mouth. 500-300-250 mcg; amt: 1 tab; oral Once A Day; 09:00 AM [DX: Type 2 diabetes mellitus with other circulatory complications]   Nutritional Supplements (PROSOURCE) LIQD Take 30 mLs by mouth 3 (three) times daily.   omeprazole (PRILOSEC) 40 MG capsule Take 1 capsule (40 mg total) by mouth daily.   potassium chloride SA (KLOR-CON M) 20 MEQ tablet Take 1 tablet (20 mEq total) by mouth 3 (three) times daily.   Multiple Vitamin (MULTIVITAMIN WITH MINERALS) TABS tablet Take 1 tablet by mouth daily. (Patient not taking: Reported on 08/19/2023)   No facility-administered encounter medications on file as of 08/19/2023.     SIGNIFICANT DIAGNOSTIC EXAMS   LABS REVIEWED   07-30-23: wbc 6.4; hgb 13.3; hct 39.8; mcv 95.0 plt 137; glucose 157; bun 14; creat 1.09; k+ 3.1; na++ 138;  ca 8.7; gfr 51; total bili 3.8; protein 6.2; albumin 2.9; hgb A1c 5.3; blood culture: NG 07-31-23: chol 119; ldl 77; trig 50; hdl 32; mag 1.7 08-03-23: wbc 6.6; hgb 11.7; hct 36.0; mcv 95.0 plt 106; glucose 86; bun 12; creat 1.10 ;k+ 3.3; na++ 136; ca 7.7; gfr 51; ammonia 49  08-10-23: wbc 5.4; hgb 11.6; hct 33.9; mcv 94.2 plt 138; glucose 108; bun 15; creat 1.28 k+ 3.2; na++ 134; ca 8.9; gfr 42 ammonia 52; phos 3.2   NO NEW LABS.  Review of Systems  Constitutional:  Negative for malaise/fatigue.  Respiratory:  Negative for cough and shortness of breath.   Cardiovascular:  Negative for chest pain, palpitations and leg swelling.  Gastrointestinal:  Negative for abdominal pain, constipation and heartburn.  Musculoskeletal:  Negative for back pain, joint pain and myalgias.  Skin: Negative.   Neurological:  Negative for dizziness.  Psychiatric/Behavioral:  The patient is not nervous/anxious.    Physical Exam Constitutional:      General: She is not in acute distress.    Appearance: She is well-developed. She is not diaphoretic.  Neck:     Thyroid: No thyromegaly.     Vascular: Carotid bruit present.  Cardiovascular:     Rate and Rhythm: Normal rate and regular rhythm.     Pulses: Normal pulses.     Heart sounds: Murmur heard.  Pulmonary:  Effort: Pulmonary effort is normal. No respiratory distress.     Breath sounds: Normal breath sounds.  Abdominal:     General: Bowel sounds are normal. There is no distension.     Palpations: Abdomen is soft.     Tenderness: There is no abdominal tenderness.  Musculoskeletal:        General: Normal range of motion.     Cervical back: Neck supple.     Right lower leg: Edema present.     Left lower leg: Edema present.  Lymphadenopathy:     Cervical: No cervical adenopathy.  Skin:    General: Skin is warm and dry.  Neurological:     Mental Status: She is alert. Mental status is at baseline.  Psychiatric:        Mood and Affect: Mood normal.        ASSESSMENT/ PLAN:   Patient is being discharged with the following home health services:pt/ot to evaluate and treat as indicated for gait balance strength adl training    Patient is being discharged with the following durable medical equipment: none needed    Patient has been advised to f/u with their PCP in 1-2 weeks to for a transitions of care visit.  Social services at their facility was responsible for arranging this appointment.  Pt was provided with adequate prescriptions of noncontrolled medications to reach the scheduled appointment .  For controlled substances, a limited supply was provided as appropriate for the individual patient.  If the pt normally receives these medications from a pain clinic or has a contract with another physician, these medications should be received from that clinic or physician only).    A 30 day supply of her prescription medications have been sent to [facility]  Time spent with patient 35 minutes: medications; home health dme     Synthia Innocent NP [facility]  call [phone]

## 2023-08-21 ENCOUNTER — Telehealth: Payer: Self-pay | Admitting: Student

## 2023-08-21 NOTE — Telephone Encounter (Signed)
New Message:     [place] needs to talk to a nurse, concerning patient's medicine.  She said patient just left the [facility] and she needs to find out the correct medicine patient is supposed to be taking.

## 2023-08-21 NOTE — Telephone Encounter (Signed)
Spoke to daughter (ok per DPR) who wanted clarification on pt's Furosemide and Coreg. Pt's daughter verbalized understanding of medication dosages and had no further questions or concerns at this time

## 2023-08-22 DIAGNOSIS — E1169 Type 2 diabetes mellitus with other specified complication: Secondary | ICD-10-CM | POA: Diagnosis not present

## 2023-08-22 DIAGNOSIS — M519 Unspecified thoracic, thoracolumbar and lumbosacral intervertebral disc disorder: Secondary | ICD-10-CM | POA: Diagnosis not present

## 2023-08-22 DIAGNOSIS — I4819 Other persistent atrial fibrillation: Secondary | ICD-10-CM | POA: Diagnosis not present

## 2023-08-22 DIAGNOSIS — I5032 Chronic diastolic (congestive) heart failure: Secondary | ICD-10-CM | POA: Diagnosis not present

## 2023-08-22 DIAGNOSIS — D631 Anemia in chronic kidney disease: Secondary | ICD-10-CM | POA: Diagnosis not present

## 2023-08-22 DIAGNOSIS — E1122 Type 2 diabetes mellitus with diabetic chronic kidney disease: Secondary | ICD-10-CM | POA: Diagnosis not present

## 2023-08-22 DIAGNOSIS — C220 Liver cell carcinoma: Secondary | ICD-10-CM | POA: Diagnosis not present

## 2023-08-22 DIAGNOSIS — K760 Fatty (change of) liver, not elsewhere classified: Secondary | ICD-10-CM | POA: Diagnosis not present

## 2023-08-22 DIAGNOSIS — E785 Hyperlipidemia, unspecified: Secondary | ICD-10-CM | POA: Diagnosis not present

## 2023-08-22 DIAGNOSIS — E46 Unspecified protein-calorie malnutrition: Secondary | ICD-10-CM | POA: Diagnosis not present

## 2023-08-22 DIAGNOSIS — I2510 Atherosclerotic heart disease of native coronary artery without angina pectoris: Secondary | ICD-10-CM | POA: Diagnosis not present

## 2023-08-22 DIAGNOSIS — I351 Nonrheumatic aortic (valve) insufficiency: Secondary | ICD-10-CM | POA: Diagnosis not present

## 2023-08-22 DIAGNOSIS — E1159 Type 2 diabetes mellitus with other circulatory complications: Secondary | ICD-10-CM | POA: Diagnosis not present

## 2023-08-22 DIAGNOSIS — K219 Gastro-esophageal reflux disease without esophagitis: Secondary | ICD-10-CM | POA: Diagnosis not present

## 2023-08-22 DIAGNOSIS — D696 Thrombocytopenia, unspecified: Secondary | ICD-10-CM | POA: Diagnosis not present

## 2023-08-22 DIAGNOSIS — N1831 Chronic kidney disease, stage 3a: Secondary | ICD-10-CM | POA: Diagnosis not present

## 2023-08-22 DIAGNOSIS — I6523 Occlusion and stenosis of bilateral carotid arteries: Secondary | ICD-10-CM | POA: Diagnosis not present

## 2023-08-22 DIAGNOSIS — D630 Anemia in neoplastic disease: Secondary | ICD-10-CM | POA: Diagnosis not present

## 2023-08-22 DIAGNOSIS — I8500 Esophageal varices without bleeding: Secondary | ICD-10-CM | POA: Diagnosis not present

## 2023-08-22 DIAGNOSIS — I152 Hypertension secondary to endocrine disorders: Secondary | ICD-10-CM | POA: Diagnosis not present

## 2023-08-22 DIAGNOSIS — K7460 Unspecified cirrhosis of liver: Secondary | ICD-10-CM | POA: Diagnosis not present

## 2023-08-22 DIAGNOSIS — I700 Atherosclerosis of aorta: Secondary | ICD-10-CM | POA: Diagnosis not present

## 2023-08-24 NOTE — Progress Notes (Signed)
[facility] [address] [place] [address]    Clinic Day:  08/25/2023  Referring physician: Elfredia Nevins, MD  Patient Care Team: Elfredia Nevins, MD as PCP - General (Internal Medicine) Wyline Mood, Dorothe Pea, MD as PCP - Cardiology (Cardiology) Ferman Hamming, DPM as Consulting Physician (Podiatry) Rehman, Joline Maxcy, MD (Inactive) as Consulting Physician (Gastroenterology) Newman Pies, MD as Consulting Physician (Otolaryngology) Beryle Beams, MD as Consulting Physician (Neurology) Samson Frederic, MD as Consulting Physician (Orthopedic Surgery) Darreld Mclean, MD as Consulting Physician (Orthopedic Surgery) Vickki Hearing, MD as Consulting Physician (Orthopedic Surgery) Jethro Bolus, MD as Consulting Physician (Ophthalmology) Sherren Kerns, MD (Inactive) as Consulting Physician (Vascular Surgery)   ASSESSMENT & PLAN:   Assessment: 1.  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  Lost 40 pounds in the last 4 years but did not lose any weight in the last 6 months. - CT CAP (04/12/2022): Cirrhosis with 4.5 cm hyperenhancing mass in the left lobe of liver.  Portal hypertension.  Extensive varices and splenomegaly. - MRI liver (07/24/2022): 4.8 x 2.5 cm segment 2/3 mass with arterial phase hyperenhancement.  Differential includes cholangiocarcinoma/mixed tumor/HCC.  No other liver lesions. - US guided left liver lobe mass biopsy on 08/15/2022 - Pathology: Well-differentiated HCC - AFP: 203 (07/01/2022), CA 19-9: 16 - 10/10/2022: Transarterial Y90 radioembolization of segment 2   2.  Social/family history: - She lives at home with her granddaughter.  Son lives across the street.  She worked as a Media planner prior to retirement.  Quit smoking 2 months ago.  Smokes 1 pack/week for the last 65 years. - No family history of  malignancies.    Plan: 1.  Stage II (T2 N0 M0) well-differentiated HCC: - She was hospitalized from 07/30/2023 through 08/01/2023 with acute ischemic CVA likely cardioembolic.  She was started on Pradaxa along with aspirin 81 mg and Lipitor. - She has dysphagia and is only able to tolerate honey thick liquids. - I have reviewed hospitalization records. - Reviewed labs from 08/11/2023: Normal CBC.  LFTs show elevated total bilirubin 1.6 and albumin low at 2.5.  AFP was elevated at 4800. - MRI of the abdomen on 08/18/2023: 2.9 x 2.4 cm soft tissue nodule in the region of the gastrohepatic ligament, likely a lymph node increased in size from previous scan.  2.45 x 1.8 cm lesion in the posterior left hepatic lobe is not substantially changed from previous scan.  Cirrhotic liver morphology. - She has become metastatic with development of lymphadenopathy outside the liver.  I have discussed combination systemic therapy with immunotherapy and wedge of antibody.  We also discussed immunotherapy option by itself.  She does not have good performance status for any systemic therapy. - I have discussed best supportive care in the form of palliative/hospice.  Will make a referral to palliative care. - She will have swallow evaluation done.  No orders of the defined types were placed in this encounter.     Alben Deeds Teague,acting as a Neurosurgeon for Barbara Massed, MD.,have documented all relevant documentation on the behalf of Barbara Massed, MD,as directed by  Barbara Massed, MD while in the presence of Barbara Massed, MD.  I, Barbara Massed MD, have reviewed the above documentation for accuracy and completeness, and I agree with the above.     Barbara Massed, MD   12/17/20245:09 PM  CHIEF  COMPLAINT:   Diagnosis: Well-differentiated hepatocellular carcinoma    Cancer Staging  Cancer, hepatocellular (HCC) Staging form: Liver, AJCC 8th Edition - Clinical stage from  08/21/2022: Stage II (cT2, cN0, cM0) - Unsigned    Prior Therapy: Y90 radioembolization 10/10/22  Current Therapy: Observation   HISTORY OF PRESENT ILLNESS:   Oncology History   No history exists.     INTERVAL HISTORY:   [name] is a 82 y.o. female presenting to clinic today for follow up of well-differentiated hepatocellular carcinoma. She was last seen by me on 06/09/23.  Since her last visit, she was admitted to the hospital on 07/30/23 for an acute ischemic stroke likely cardioembolic. She was started on pradaxa 150 mg BID on 11/22 and discontinued apixaban. She also had bilateral pleural effusions and underwent thoracentesis with 1.3 L of fluid removed from the right lung.   She had an MRI of the abdomen on 08/18/23 that found: 2.9 x 2.4 cm soft tissue nodule in the region of the gastrohepatic ligament, likely a lymph node, increased in size from 2.0 x 1.7 cm compatible with metastatic disease; 2.5 x 1.8 cm lesion in the posterior left hepatic lobe is not substantially changed from 2.9 x 1.7 cm on previous MRI; cirrhotic liver morphology; distended gallbladder with similar layering sludge/stones; there may be some trace pericholecystic fluid; and moderate right pleural effusion similar to prior.  Today, she states that she is doing well overall. Her appetite level is at 50%. Her energy level is at 0%. She is accompanied by her granddaughter. The patient's granddaughter notes she has difficulty swallowing foods and thick fluids, as well as weakness since her stroke. She has not been eating or drinking adequate amounts due to dysphagia. She has been drinking honey-thick liquids. She was sen by speech and language pathology while hospitalized, which stated that she was able to swallow. She will start speech therapy today. Her lactulose dosage has been increased to 40 g TID.   PAST MEDICAL HISTORY:   Past Medical History: Past Medical History:  Diagnosis Date   Anemia    Anxiety     Aortic insufficiency    Moderate   Atrial fibrillation (HCC)    Back pain    Carotid stenosis, bilateral    Cirrhosis (HCC)    Closed left femoral fracture (HCC) 04/21/2017   Coronary artery disease    Stent x 2 RCA 1995, cardiac catheterization 09/2016 showing only mild atherosclerosis   DDD (degenerative disc disease), lumbar    Depression    Diabetes mellitus without complication (HCC)    Essential hypertension    Falls    GERD (gastroesophageal reflux disease)    Hiatal hernia    History of stroke    Hypercholesteremia    IBS (irritable bowel syndrome)    Non-alcoholic fatty liver disease    OSA (obstructive sleep apnea)    no CPAP   Pericardial effusion    a. small by echo 2018.   PSVT (paroxysmal supraventricular tachycardia) (HCC)    Recurrent UTI    Seizures (HCC) 2018   Stroke (HCC) 07/2019   several strokes within the last 10 years   Swallowing difficulty    Varices, esophageal (HCC)     Surgical History: Past Surgical History:  Procedure Laterality Date   ANAL FISSURE REPAIR  1972   APPENDECTOMY     BACK SURGERY  1985   CARDIAC CATHETERIZATION  [phone]   Stent to the proximal RCA after MI    CARDIAC CATHETERIZATION  N/A 09/26/2016   Procedure: Left Heart Cath and Coronary Angiography;  Surgeon: Marykay Lex, MD;  Location: [facility];  Service: Cardiovascular;  Laterality: N/A;   CATARACT EXTRACTION W/PHACO Right 07/04/2014   Procedure: CATARACT EXTRACTION PHACO AND INTRAOCULAR LENS PLACEMENT (IOC);  Surgeon: Loraine Leriche T. Nile Riggs, MD;  Location: [facility];  Service: Ophthalmology;  Laterality: Right;  CDE:13.13   COLONOSCOPY     DILATION AND CURETTAGE OF UTERUS     x2   Epi Retinal Membrane Peel Left    ERCP     ESOPHAGEAL BANDING N/A 04/01/2013   Procedure: ESOPHAGEAL BANDING;  Surgeon: Malissa Hippo, MD;  Location: [facility];  Service: Endoscopy;  Laterality: N/A;   ESOPHAGEAL BANDING N/A 05/24/2013   Procedure: ESOPHAGEAL BANDING;   Surgeon: Malissa Hippo, MD;  Location: [facility];  Service: Endoscopy;  Laterality: N/A;   ESOPHAGEAL BANDING N/A 06/21/2014   Procedure: ESOPHAGEAL BANDING;  Surgeon: Malissa Hippo, MD;  Location: [facility];  Service: Endoscopy;  Laterality: N/A;   ESOPHAGEAL BANDING  06/15/2020   Procedure: ESOPHAGEAL BANDING;  Surgeon: Dolores Frame, MD;  Location: [facility];  Service: Gastroenterology;;   ESOPHAGOGASTRODUODENOSCOPY N/A 04/01/2013   Procedure: ESOPHAGOGASTRODUODENOSCOPY (EGD);  Surgeon: Malissa Hippo, MD;  Location: [facility];  Service: Endoscopy;  Laterality: N/A;  230-rescheduled to 8:30am Ann notified pt   ESOPHAGOGASTRODUODENOSCOPY N/A 05/24/2013   Procedure: ESOPHAGOGASTRODUODENOSCOPY (EGD);  Surgeon: Malissa Hippo, MD;  Location: [facility];  Service: Endoscopy;  Laterality: N/A;  730   ESOPHAGOGASTRODUODENOSCOPY N/A 06/21/2014   Procedure: ESOPHAGOGASTRODUODENOSCOPY (EGD);  Surgeon: Malissa Hippo, MD;  [facility]: [facility];  Service: Endoscopy;  Laterality: N/A;  930-rescheduled 10/14 @ 1200 Ann to notify pt   ESOPHAGOGASTRODUODENOSCOPY (EGD) WITH PROPOFOL N/A 06/15/2020   Procedure: ESOPHAGOGASTRODUODENOSCOPY (EGD) WITH PROPOFOL;  Surgeon: Dolores Frame, MD;  [facility]: [facility];  Service: Gastroenterology;  Laterality: N/A;  230   ESOPHAGOGASTRODUODENOSCOPY (EGD) WITH PROPOFOL N/A 07/27/2020   Procedure: ESOPHAGOGASTRODUODENOSCOPY (EGD) WITH PROPOFOL;  Surgeon: Dolores Frame, MD;  [facility]: [facility];  Service: Gastroenterology;  Laterality: N/A;  1245   ESOPHAGOGASTRODUODENOSCOPY (EGD) WITH PROPOFOL N/A 08/19/2022   Procedure: ESOPHAGOGASTRODUODENOSCOPY (EGD) WITH PROPOFOL;  Surgeon: Dolores Frame, MD;  Location: [facility];  Service: Gastroenterology;  Laterality: N/A;  130 ASA 3, pt knows to arrive at 6:15   EYE SURGERY  08   cataract surgery of the left eye   FEMUR IM NAIL Left 03/02/2017    Procedure: INTRAMEDULLARY (IM) NAIL FEMORAL;  Surgeon: Samson Frederic, MD;  Location: [facility];  Service: Orthopedics;  Laterality: Left;   HARDWARE REMOVAL Right 01/17/2013   Procedure: REMOVAL OF HARDWARE AND EXCISION ULNAR STYLOID RIGHT WRIST;  Surgeon: Tami Ribas, MD;  Location: [facility];  Service: Orthopedics;  Laterality: Right;   implanted heart monitor  02/2020   to monitor Afib   IR 3D INDEPENDENT WKST  09/26/2022   IR ANGIOGRAM SELECTIVE EACH ADDITIONAL VESSEL  09/26/2022   IR ANGIOGRAM SELECTIVE EACH ADDITIONAL VESSEL  09/26/2022   IR ANGIOGRAM SELECTIVE EACH ADDITIONAL VESSEL  09/26/2022   IR ANGIOGRAM SELECTIVE EACH ADDITIONAL VESSEL  09/26/2022   IR ANGIOGRAM SELECTIVE EACH ADDITIONAL VESSEL  09/26/2022   IR ANGIOGRAM SELECTIVE EACH ADDITIONAL VESSEL  10/10/2022   IR ANGIOGRAM SELECTIVE EACH ADDITIONAL VESSEL  10/10/2022   IR ANGIOGRAM SELECTIVE EACH ADDITIONAL VESSEL  10/10/2022   IR ANGIOGRAM VISCERAL SELECTIVE  09/26/2022   IR ANGIOGRAM VISCERAL SELECTIVE  10/10/2022   IR EMBO TUMOR ORGAN ISCHEMIA INFARCT INC GUIDE ROADMAPPING  10/10/2022   IR INTRAVASCULAR ULTRASOUND NON CORONARY  09/26/2022   IR RADIOLOGIST EVAL & MGMT  09/10/2022   IR RADIOLOGIST EVAL & MGMT  11/03/2022   IR RADIOLOGIST EVAL & MGMT  02/10/2023   IR RADIOLOGIST EVAL & MGMT  05/15/2023   IR TRANSCATH PLC STENT 1ST ART NOT LE CV CAR VERT CAR  09/26/2022   IR US GUIDE VASC ACCESS RIGHT  09/26/2022   IR US GUIDE VASC ACCESS RIGHT  10/10/2022   MYRINGOTOMY  2012   both ears   ORIF FEMUR FRACTURE Left 04/23/2017   Procedure: OPEN REDUCTION INTERNAL FIXATION (ORIF) DISTAL FEMUR FRACTURE (FRACTURE AROUND FEMORAL NAIL);  Surgeon: Myrene Galas, MD;  Location: [facility] OR;  Service: Orthopedics;  Laterality: Left;   TONSILLECTOMY     TUBAL LIGATION     VAGINAL HYSTERECTOMY  1972   WRIST SURGERY     rt wrist hardwear removal    Social History: Social History   Socioeconomic History   Marital status: Widowed    Spouse  name: Not on file   Number of children: 2   Years of education: 2 years college   Highest education level: Some college, no degree  Occupational History    Employer: RETIRED  Tobacco Use   Smoking status: Former    Current packs/day: 0.00    Average packs/day: 0.3 packs/day for 50.0 years (12.5 ttl pk-yrs)    Types: Cigarettes    Start date: 07/09/1972    Quit date: 07/09/2022    Years since quitting: 1.1    Passive exposure: Current   Smokeless tobacco: Never   Tobacco comments:    12/23/21 1 pack Q two weeks.  Vaping Use   Vaping status: Never Used  Substance and Sexual Activity   Alcohol use: No    Alcohol/week: 0.0 standard drinks of alcohol   Drug use: No   Sexual activity: Not Currently    Birth control/protection: Surgical  Other Topics Concern   Not on file  Social History Narrative   12/23/21  Lives with 3 grandchildren, sone and dgtr-in -law are across the st   Social Drivers of Health   Financial Resource Strain: Low Risk  (09/15/2022)   Overall Financial Resource Strain (CARDIA)    Difficulty of Paying Living Expenses: Not hard at all  Food Insecurity: No Food Insecurity (07/31/2023)   Hunger Vital Sign    Worried About Running Out of Food in the Last Year: Never true    Ran Out of Food in the Last Year: Never true  Transportation Needs: No Transportation Needs (07/31/2023)   PRAPARE - Administrator, Civil Service (Medical): No    Lack of Transportation (Non-Medical): No  Physical Activity: Inactive (03/16/2019)   Exercise Vital Sign    Days of Exercise per Week: 0 days    Minutes of Exercise per Session: 0 min  Stress: No Stress Concern Present (03/16/2019)   [employer] of Occupational Health - Occupational Stress Questionnaire    Feeling of Stress : Not at all  Social Connections: Moderately Isolated (03/16/2019)   Social Connection and Isolation Panel [NHANES]    Frequency of Communication with Friends and Family: More than three times a week     Frequency of Social Gatherings with Friends and Family: Never    Attends Religious Services: 1 to 4 times per year  Active Member of Clubs or Organizations: No    Attends Banker Meetings: Never    Marital Status: Widowed  Intimate Partner Violence: Not At Risk (07/31/2023)   Humiliation, Afraid, Rape, and Kick questionnaire    Fear of Current or Ex-Partner: No    Emotionally Abused: No    Physically Abused: No    Sexually Abused: No    Family History: Family History  Problem Relation Age of Onset   Diabetes Mother    Heart failure Father    Heart failure Maternal Aunt     Current Medications:  Current Outpatient Medications:    acetaminophen (TYLENOL) 500 MG tablet, Take 1,000 mg by mouth at bedtime., Disp: , Rfl:    aspirin EC 81 MG tablet, Take 1 tablet (81 mg total) by mouth daily. Swallow whole., Disp: 30 tablet, Rfl: 12   atorvastatin (LIPITOR) 80 MG tablet, Take 1 tablet (80 mg total) by mouth at bedtime., Disp: 30 tablet, Rfl: 0   Calcium Carbonate 500 (200 Ca) MG WAFR, Take 1 tablet by mouth in the morning and at bedtime., Disp: , Rfl:    CALCIUM CITRATE PO, Take 200 mg by mouth 2 (two) times daily., Disp: , Rfl:    carvedilol (COREG) 3.125 MG tablet, Take 1 tablet (3.125 mg total) by mouth 2 (two) times daily with a meal., Disp: 60 tablet, Rfl: 0   Cholecalciferol (VITAMIN D3) 50 MCG (2000 UT) TABS, Take 2,000 Units by mouth daily., Disp: , Rfl:    Coenzyme Q10 (CO Q-10) 100 MG CAPS, Take 100 mg by mouth in the morning., Disp: , Rfl:    dabigatran (PRADAXA) 150 MG CAPS capsule, Take 1 capsule (150 mg total) by mouth every 12 (twelve) hours., Disp: 60 capsule, Rfl: 0   diltiazem (CARDIZEM CD) 240 MG 24 hr capsule, Take 1 capsule (240 mg total) by mouth daily., Disp: 30 capsule, Rfl: 0   doxepin (SINEQUAN) 25 MG capsule, Take 1 capsule (25 mg total) by mouth at bedtime., Disp: 30 capsule, Rfl: 0   furosemide (LASIX) 40 MG tablet, Take 1 tablet (40 mg  total) by mouth daily., Disp: 30 tablet, Rfl: 0   lactulose (CHRONULAC) 10 GM/15ML solution, Take 60 mLs (40 g total) by mouth 3 (three) times daily., Disp: 4800 mL, Rfl: 0   Multiple Vitamin (MULTIVITAMIN WITH MINERALS) TABS tablet, Take 1 tablet by mouth daily., Disp: , Rfl:    Multiple Vitamins-Minerals (SENTRY SENIOR/LUTEIN) TABS, Take by mouth. 500-300-250 mcg; amt: 1 tab; oral Once A Day; 09:00 AM [DX: Type 2 diabetes mellitus with other circulatory complications], Disp: , Rfl:    Nutritional Supplements (PROSOURCE) LIQD, Take 30 mLs by mouth 3 (three) times daily., Disp: 2700 mL, Rfl: 0   omeprazole (PRILOSEC) 40 MG capsule, Take 1 capsule (40 mg total) by mouth daily., Disp: 30 capsule, Rfl: 0   potassium chloride SA (KLOR-CON M) 20 MEQ tablet, Take 1 tablet (20 mEq total) by mouth 3 (three) times daily., Disp: 90 tablet, Rfl: 0   Allergies: Allergies  Allergen Reactions   Tape Rash and Other (See Comments)    Old paper tape caused a reaction, doesn't react to most other tapes     REVIEW OF SYSTEMS:   Review of Systems  Constitutional:  Negative for chills, fatigue and fever.  HENT:   Negative for lump/mass, mouth sores, nosebleeds, sore throat and trouble swallowing.   Eyes:  Negative for eye problems.  Respiratory:  Negative for cough and shortness of  breath.   Cardiovascular:  Negative for chest pain, leg swelling and palpitations.  Gastrointestinal:  Negative for abdominal pain, constipation, diarrhea, nausea and vomiting.  Genitourinary:  Negative for bladder incontinence, difficulty urinating, dysuria, frequency, hematuria and nocturia.   Musculoskeletal:  Negative for arthralgias, back pain, flank pain, myalgias and neck pain.  Skin:  Negative for itching and rash.  Neurological:  Negative for dizziness, headaches and numbness.  Hematological:  Does not bruise/bleed easily.  Psychiatric/Behavioral:  Negative for depression, sleep disturbance and suicidal ideas. The patient  is not nervous/anxious.   All other systems reviewed and are negative.    VITALS:   Blood pressure 134/66, pulse 64, temperature 97.9 F (36.6 C), temperature source Tympanic, resp. rate 17, height 4\' 11"  (1.499 m), weight 118 lb (53.5 kg), SpO2 98%.  Wt Readings from Last 3 Encounters:  08/25/23 118 lb (53.5 kg)  08/19/23 118 lb 12.8 oz (53.9 kg)  08/14/23 120 lb 12.8 oz (54.8 kg)    Body mass index is 23.83 kg/m.  Performance status (ECOG): 1 - Symptomatic but completely ambulatory  PHYSICAL EXAM:   Physical Exam Vitals and nursing note reviewed. Exam conducted with a chaperone present.  Constitutional:      Appearance: Normal appearance.  Cardiovascular:     Rate and Rhythm: Normal rate and regular rhythm.     Pulses: Normal pulses.     Heart sounds: Normal heart sounds.  Pulmonary:     Effort: Pulmonary effort is normal.     Breath sounds: Normal breath sounds.  Abdominal:     Palpations: Abdomen is soft. There is no hepatomegaly, splenomegaly or mass.     Tenderness: There is no abdominal tenderness.  Musculoskeletal:     Right lower leg: No edema.     Left lower leg: No edema.  Lymphadenopathy:     Cervical: No cervical adenopathy.     Right cervical: No superficial, deep or posterior cervical adenopathy.    Left cervical: No superficial, deep or posterior cervical adenopathy.     Upper Body:     Right upper body: No supraclavicular or axillary adenopathy.     Left upper body: No supraclavicular or axillary adenopathy.  Neurological:     General: No focal deficit present.     Mental Status: She is alert and oriented to person, place, and time.  Psychiatric:        Mood and Affect: Mood normal.        Behavior: Behavior normal.     LABS:      Latest Ref Rng & Units 08/11/2023    1:41 PM 08/10/2023    6:05 AM 08/03/2023    8:00 AM  CBC  WBC 4.0 - 10.5 K/uL 7.8  5.4  6.6   Hemoglobin 12.0 - 15.0 g/dL 35.5  73.2  20.2   Hematocrit 36.0 - 46.0 % 39.6   33.9  36.0   Platelets 150 - 400 K/uL 170  138  106       Latest Ref Rng & Units 08/13/2023    5:00 AM 08/11/2023    1:41 PM 08/10/2023    6:05 AM  CMP  Glucose 70 - 99 mg/dL 542  706  237   BUN 8 - 23 mg/dL 16  15  15    Creatinine 0.44 - 1.00 mg/dL 6.28  3.15  1.76   Sodium 135 - 145 mmol/L 139  138  134   Potassium 3.5 - 5.1 mmol/L 3.9  3.6  3.2  Chloride 98 - 111 mmol/L 106  102  103   CO2 22 - 32 mmol/L 23  24  25    Calcium 8.9 - 10.3 mg/dL 9.7  9.6  8.9   Total Protein 6.5 - 8.1 g/dL  6.0    Total Bilirubin <1.2 mg/dL  1.6    Alkaline Phos 38 - 126 U/L  124    AST 15 - 41 U/L  52    ALT 0 - 44 U/L  30       No results found for: "CEA1", "CEA" / No results found for: "CEA1", "CEA" No results found for: "PSA1" Lab Results  Component Value Date   [id number] 16 07/29/2022   No results found for: "EAV409"  No results found for: "TOTALPROTELP", "ALBUMINELP", "A1GS", "A2GS", "BETS", "BETA2SER", "GAMS", "MSPIKE", "SPEI" No results found for: "TIBC", "FERRITIN", "IRONPCTSAT" No results found for: "LDH"   STUDIES:   MR ABDOMEN WO CONTRAST Result Date: 08/25/2023 CLINICAL DATA:  Gallbladder cancer.  Restaging. EXAM: MRI ABDOMEN WITHOUT CONTRAST TECHNIQUE: Multiplanar multisequence MR imaging was performed without the administration of intravenous contrast. COMPARISON:  PET-CT 05/28/2023.  MRI abdomen 05/08/2023. FINDINGS: Intravenous access could not be obtained in this patient. Study was performed without intravenous contrast material. Lower chest: Moderate right pleural effusion is similar to prior. No substantial left pleural effusion. Hepatobiliary: Cirrhotic liver morphology again noted. Assessment of liver parenchyma limited by lack of intravenous contrast material. Previously identified lesion in the posterior left hepatic lobe measures 2.5 x 1.8 cm today, not substantially changed from 2.9 x 1.7 cm on previous MRI. Gallbladder is distended with similar layering sludge/stones.  There may be some trace pericholecystic fluid. No substantial intrahepatic biliary duct dilatation. Common bile duct measures 5 mm diameter in the head of the pancreas. Pancreas: No focal or discrete pancreatic mass lesion. Diffuse prominence of the main pancreatic duct was similar to prior. Spleen:  No splenomegaly. No suspicious focal mass lesion. Adrenals/Urinary Tract: No adrenal nodule or mass. No hydronephrosis. Small well-defined homogeneous T2 hyperintense lesions noted in both kidneys, similar to prior and compatible with cyst. No followup imaging is recommended. Stomach/Bowel: Stomach is decompressed which accentuates wall thickness. Component of background true wall thickening in the stomach cannot be excluded. No small bowel or colonic dilatation within the visualized abdomen. Vascular/Lymphatic: No abdominal aortic aneurysm. 2.9 x 2.4 cm soft tissue nodule identified in the region of the gastrohepatic ligament, likely a lymph node (axial T2 haste image 12 series 6 and coronal T2 haste image 13 of series 3. This has increased in size from 2.0 x 1.7 cm when remeasured in a similar fashion on the prior study. Other:  No intraperitoneal free fluid. Musculoskeletal: No overtly suspicious marrow signal abnormality identified on this noncontrast study. IMPRESSION: 1. Study performed without intravenous contrast material due to lack of intravenous access. 2. 2.9 x 2.4 cm soft tissue nodule in the region of the gastrohepatic ligament, likely a lymph node, increased in size from 2.0 x 1.7 cm when remeasured in a similar fashion on the prior study. This is compatible with metastatic disease. 3. 2.5 x 1.8 cm lesion in the posterior left hepatic lobe is not substantially changed from 2.9 x 1.7 cm on previous MRI. 4. Cirrhotic liver morphology. 5. Distended gallbladder with similar layering sludge/stones. There may be some trace pericholecystic fluid. 6. Moderate right pleural effusion, similar to prior.  Electronically Signed   By: Kennith Center M.D.   On: 08/25/2023 11:24   ECHOCARDIOGRAM COMPLETE Result  Date: 07/31/2023    ECHOCARDIOGRAM REPORT   Patient Name:   [name] Date of Exam: 07/31/2023 Medical Rec #:  [id number]      Height:       62.0 in Accession #:    [id number]     Weight:       134.0 lb Date of Birth:  13-Aug-1941     BSA:          1.613 m Patient Age:    81 years       BP:           124/57 mmHg Patient Gender: F              HR:           92 bpm. Exam Location:  Inpatient Procedure: 2D Echo, Cardiac Doppler and Color Doppler Indications:    stroke  History:        Patient has prior history of Echocardiogram examinations, most                 recent 08/20/2022. CAD, chronic kidney disease,                 Arrythmias:Atrial Fibrillation, Signs/Symptoms:Altered Mental                 Status; Risk Factors:Hypertension.  Sonographer:    Delcie Roch RDCS Referring Phys: [id number] TIMOTHY S OPYD  Sonographer Comments: Image acquisition challenging due to respiratory motion. IMPRESSIONS  1. Left ventricular ejection fraction, by estimation, is >75%. The left ventricle has hyperdynamic function. The left ventricle has no regional wall motion abnormalities. Left ventricular diastolic parameters are indeterminate.  2. Right ventricular systolic function is normal. The right ventricular size is normal. Tricuspid regurgitation signal is inadequate for assessing PA pressure.  3. Left atrial size was severely dilated.  4. The mitral valve is abnormal. Mild mitral valve regurgitation. Moderate mitral stenosis. The mean mitral valve gradient is 7.5 mmHg. Severe mitral annular calcification.  5. The aortic valve was not well visualized. Aortic valve regurgitation is moderate to severe. Suboptimal suprasternal window for quantification of AR. Visually appears to be severe AR but moderate AR by PHT of 341 msec. Moderate to severe aortic valve stenosis. Aortic valve area, by VTI measures 0.77 cm. Aortic  valve mean gradient measures 25.0 mmHg. Aortic valve Vmax measures 3.37 m/s. DVI is 0.30.  6. The inferior vena cava is normal in size with greater than 50% respiratory variability, suggesting right atrial pressure of 3 mmHg. Comparison(s): Changes from prior study are noted. Moderate to severe AI and moderate to severe AS. FINDINGS  Left Ventricle: Left ventricular ejection fraction, by estimation, is >75%. The left ventricle has hyperdynamic function. The left ventricle has no regional wall motion abnormalities. The left ventricular internal cavity size was normal in size. There is no left ventricular hypertrophy. Left ventricular diastolic parameters are indeterminate. Right Ventricle: The right ventricular size is normal. No increase in right ventricular wall thickness. Right ventricular systolic function is normal. Tricuspid regurgitation signal is inadequate for assessing PA pressure. Left Atrium: Left atrial size was severely dilated. Right Atrium: Right atrial size was normal in size. Pericardium: There is no evidence of pericardial effusion. Mitral Valve: The mitral valve is abnormal. Severe mitral annular calcification. Mild mitral valve regurgitation. Moderate mitral valve stenosis. MV peak gradient, 15.6 mmHg. The mean mitral valve gradient is 7.5 mmHg. Tricuspid Valve: The tricuspid valve is not well visualized. Tricuspid valve regurgitation is not demonstrated.  No evidence of tricuspid stenosis. Aortic Valve: The aortic valve was not well visualized. Aortic valve regurgitation is moderate to severe. Aortic regurgitation PHT measures 341 msec. Moderate to severe aortic stenosis is present. Aortic valve mean gradient measures 25.0 mmHg. Aortic valve peak gradient measures 45.4 mmHg. Aortic valve area, by VTI measures 0.77 cm. Pulmonic Valve: The pulmonic valve was not well visualized. Pulmonic valve regurgitation is not visualized. No evidence of pulmonic stenosis. Aorta: The aortic root is normal in  size and structure. Venous: The inferior vena cava is normal in size with greater than 50% respiratory variability, suggesting right atrial pressure of 3 mmHg. IAS/Shunts: No atrial level shunt detected by color flow Doppler.  LEFT VENTRICLE PLAX 2D LVIDd:         4.30 cm LVIDs:         2.00 cm LV PW:         1.20 cm LV IVS:        1.10 cm LVOT diam:     1.80 cm LV SV:         46 LV SV Index:   29 LVOT Area:     2.54 cm  RIGHT VENTRICLE             IVC RV Basal diam:  2.40 cm     IVC diam: 1.40 cm RV S prime:     13.40 cm/s TAPSE (M-mode): 1.6 cm LEFT ATRIUM              Index        RIGHT ATRIUM           Index LA diam:        4.10 cm  2.54 cm/m   RA Area:     13.80 cm LA Vol (A2C):   108.0 ml 66.97 ml/m  RA Volume:   29.60 ml  18.35 ml/m LA Vol (A4C):   99.1 ml  61.45 ml/m LA Biplane Vol: 105.0 ml 65.11 ml/m  AORTIC VALVE AV Area (Vmax):    0.73 cm AV Area (Vmean):   0.72 cm AV Area (VTI):     0.77 cm AV Vmax:           337.00 cm/s AV Vmean:          226.750 cm/s AV VTI:            0.603 m AV Peak Grad:      45.4 mmHg AV Mean Grad:      25.0 mmHg LVOT Vmax:         96.43 cm/s LVOT Vmean:        64.533 cm/s LVOT VTI:          0.183 m LVOT/AV VTI ratio: 0.30 AI PHT:            341 msec  AORTA Ao Root diam: 3.20 cm MITRAL VALVE MV Area VTI:  1.05 cm    SHUNTS MV Peak grad: 15.6 mmHg   Systemic VTI:  0.18 m MV Mean grad: 7.5 mmHg    Systemic Diam: 1.80 cm MV Vmax:      1.98 m/s MV Vmean:     123.5 cm/s Vishnu Priya Mallipeddi Electronically signed by Winfield Rast Mallipeddi Signature Date/Time: 07/31/2023/6:06:00 PM    Final    US THORACENTESIS ASP PLEURAL SPACE W/IMG GUIDE Result Date: 07/31/2023 INDICATION: Right pleural effusion EXAM: ULTRASOUND GUIDED RIGHT THORACENTESIS MEDICATIONS: None. COMPLICATIONS: None immediate. PROCEDURE: An ultrasound guided thoracentesis was thoroughly discussed with the patient  and questions answered. The benefits, risks, alternatives and complications were also  discussed. The patient understands and wishes to proceed with the procedure. Written consent was obtained. Ultrasound was performed to localize and mark an adequate pocket of fluid in the right chest. The area was then prepped and draped in the normal sterile fashion. 1% Lidocaine was used for local anesthesia. Under ultrasound guidance a 8 Fr Safe-T-Centesis catheter was introduced. Thoracentesis was performed. The catheter was removed and a dressing applied. FINDINGS: A total of approximately 1.3 L of straw-colored fluid was removed. Samples were sent to the laboratory as requested by the clinical team. IMPRESSION: Successful ultrasound guided right thoracentesis yielding 1.3 L of pleural fluid. Electronically Signed   By: Acquanetta Belling M.D.   On: 07/31/2023 15:07   US Carotid Bilateral (at [facility] and [facility] only) Result Date: 07/31/2023 CLINICAL DATA:  Acute ischemic stroke Hypertension CAD Hyperlipidemia Former tobacco user Diabetes EXAM: BILATERAL CAROTID DUPLEX ULTRASOUND TECHNIQUE: Wallace Cullens scale imaging, color Doppler and duplex ultrasound were performed of bilateral carotid and vertebral arteries in the neck. COMPARISON:  None available FINDINGS: Criteria: Quantification of carotid stenosis is based on velocity parameters that correlate the residual internal carotid diameter with NASCET-based stenosis levels, using the diameter of the distal internal carotid lumen as the denominator for stenosis measurement. The following velocity measurements were obtained: RIGHT ICA: 130/18 cm/sec CCA: 63/7 cm/sec SYSTOLIC ICA/CCA RATIO:  2.1 ECA: 96 cm/sec LEFT ICA: 138/25 cm/sec CCA: 71/6 cm/sec SYSTOLIC ICA/CCA RATIO:  78 ECA: 1.9 cm/sec RIGHT CAROTID ARTERY: Moderate long segment calcified plaque of the mid right common carotid artery. Moderate calcified plaque of the right internal carotid artery origin. RIGHT VERTEBRAL ARTERY:  Antegrade flow. LEFT CAROTID ARTERY: Moderate calcified plaque of the left internal carotid  artery origin. LEFT VERTEBRAL ARTERY:  Antegrade flow. IMPRESSION: Moderate calcified plaque of the internal carotid artery origins bilaterally with Doppler measurements suggestive of 50-69% stenosis. Electronically Signed   By: Acquanetta Belling M.D.   On: 07/31/2023 15:07   DG Chest 1 View Result Date: 07/31/2023 CLINICAL DATA:  Status post right thoracentesis EXAM: CHEST  1 VIEW COMPARISON:  07/30/2023 FINDINGS: Unchanged mild cardiomegaly.  No pulmonary vascular congestion. Interval decrease of right pleural effusion. No pneumothorax. Moderate left pleural effusion remains. Masslike opacity in the right infrahilar lung likely relates to atelectasis of the right middle lobe seen better on prior CT. IMPRESSION: No pneumothorax status post right thoracentesis. Electronically Signed   By: Acquanetta Belling M.D.   On: 07/31/2023 15:04   CT Chest W Contrast Result Date: 07/30/2023 CLINICAL DATA:  Abnormal chest radiograph.  Pulmonary nodule. EXAM: CT CHEST WITH CONTRAST TECHNIQUE: Multidetector CT imaging of the chest was performed during intravenous contrast administration. RADIATION DOSE REDUCTION: This exam was performed according to the departmental dose-optimization program which includes automated exposure control, adjustment of the mA and/or kV according to patient size and/or use of iterative reconstruction technique. CONTRAST:  75mL OMNIPAQUE IOHEXOL 300 MG/ML  SOLN COMPARISON:  Chest CT dated 8523 and radiograph dated 07/30/2023. FINDINGS: Evaluation of this exam is limited due to respiratory motion. Cardiovascular: There is no cardiomegaly or pericardial effusion. Advanced 3 vessel coronary vascular calcification and calcification of the mitral annulus. Moderate atherosclerotic calcification of the thoracic aorta. No aneurysmal dilatation or dissection. The origins of the great vessels of the aortic arch and the central pulmonary arteries appear patent. Mediastinum/Nodes: No obvious hilar adenopathy.  Evaluation however is limited due to consolidative changes of the lungs and pleural  effusions. Lymph node anterior to the carina measures 15 mm short axis. The esophagus is grossly unremarkable. No mediastinal fluid collection. Lungs/Pleura: Large right and moderate left pleural effusions. There is partial compressive atelectasis of the lower lobes versus pneumonia. There is near complete collapse of the right middle lobe, new since the prior CT. This is of indeterminate etiology and a centrally occlusive mass is not excluded. Several calcified granuloma noted in the right hilum adjacent to the right middle lobe bronchi. There is no pneumothorax. The central airways remain patent. Upper Abdomen: Cirrhosis.  Dilated upper abdominal collaterals. Musculoskeletal: Osteopenia with degenerative changes. No acute osseous pathology. IMPRESSION: 1. Large right and moderate left pleural effusions. Partial compressive atelectasis of the lower lobes versus pneumonia. 2. Near complete collapse of the right middle lobe, of indeterminate etiology. A centrally occlusive mass is not excluded. Bronchoscopy may provide better evaluation. 3. Advanced 3 vessel coronary vascular calcification. 4. Cirrhosis. 5.  Aortic Atherosclerosis (ICD10-I70.0). Electronically Signed   By: Elgie Collard M.D.   On: 07/30/2023 20:34   DG Chest 2 View Result Date: 07/30/2023 CLINICAL DATA:  Weakness. EXAM: CHEST - 2 VIEW COMPARISON:  Chest radiograph dated 06/03/2022. FINDINGS: Bilateral pleural effusions, right greater than left, with associated bibasilar atelectasis. Pneumonia is not excluded. No pneumothorax. Stable cardiac silhouette. A loop recorder device. Atherosclerotic calcification of the aorta. Degenerative changes of the spine. No acute osseous pathology. IMPRESSION: Bilateral pleural effusions, right greater than left, with associated bibasilar atelectasis. Electronically Signed   By: Elgie Collard M.D.   On: 07/30/2023 19:19    MR BRAIN WO CONTRAST Result Date: 07/30/2023 CLINICAL DATA:  Initial evaluation for mental status change, unknown cause. EXAM: MRI HEAD WITHOUT CONTRAST MRA HEAD WITHOUT CONTRAST TECHNIQUE: Multiplanar, multi-echo pulse sequences of the brain and surrounding structures were acquired without intravenous contrast. Angiographic images of the Circle of Willis were acquired using MRA technique without intravenous contrast. COMPARISON:  Prior study from 12/06/2021. FINDINGS: MRI HEAD FINDINGS Brain: Age-related cerebral volume loss. Patchy T2/FLAIR hyperintensity involving the periventricular deep white matter both cerebral hemispheres as well as the pons, consistent with chronic small vessel ischemic disease, mild in nature. Encephalomalacia and gliosis involving the right frontal operculum, consistent with a chronic right MCA distribution infarct. Additional small remote left PCA territory infarct involving the left occipital lobe noted. Few scattered remote cerebellar infarcts noted, left greater than right. Few scattered remote lacunar infarcts present about the deep gray nuclei bilaterally. Approximate 3.4 cm area of restricted diffusion involving the anterior left insula and overlying operculum, consistent with acute left MCA distribution infarct. No significant mass effect or associated hemorrhage. No other evidence for acute or subacute ischemia. No other acute intracranial hemorrhage. No significant chronic intracranial blood products. No mass lesion, midline shift or mass effect. No hydrocephalus or extra-axial fluid collection. Pituitary gland and suprasellar region within normal limits. Vascular: Major intracranial vascular flow voids are maintained. Skull and upper cervical spine: Craniocervical junction within normal limits. Bone marrow signal intensity normal. No scalp soft tissue abnormality. Sinuses/Orbits: Prior bilateral ocular lens replacement. Paranasal sinuses are clear. Large right mastoid  effusion noted. Additional trace left mastoid effusion. Visualized nasopharynx unremarkable. Other: None. MRA HEAD FINDINGS Anterior circulation: Examination mildly degraded by motion artifact. Both internal carotid arteries are patent through the siphons without significant stenosis or other abnormality. A1 segments, anterior communicating artery complex common anterior cerebral arteries patent without stenosis. No M1 stenosis or occlusion. Right MCA branches patent and well perfused. On the left,  there is a probable acute left M3 occlusion (series 5, image 177), corresponding with the evolving left MCA distribution infarct. Remainder of the left MCA branches remain patent and perfused. Posterior circulation: Both V4 segments patent without stenosis. Both PICA patent at their origins. Basilar widely patent without stenosis. Superior cerebellar arteries patent bilaterally. Both PCAs primarily supplied via the basilar. Left PCA patent to its distal aspect without significant stenosis. Focal severe distal right P2 stenosis (series 1032, image 13). Right PCA otherwise patent as well. Anatomic variants: None significant.  No intracranial aneurysm. IMPRESSION: MRI HEAD: 1. 3.4 cm acute involving the anterior left insula and overlying operculum, left MCA distribution. No associated hemorrhage or significant mass effect. 2. Underlying age-related cerebral volume loss with chronic microvascular ischemic disease, with multiple remote infarcts as above. MRA HEAD: 1. Probable acute left M3 occlusion, corresponding with the evolving left MCA distribution infarct. 2. Focal severe distal right P2 stenosis. 3. Otherwise wide patency of the major intracranial arterial vasculature. Electronically Signed   By: Rise Mu M.D.   On: 07/30/2023 18:44   MR ANGIO HEAD WO CONTRAST Result Date: 07/30/2023 CLINICAL DATA:  Initial evaluation for mental status change, unknown cause. EXAM: MRI HEAD WITHOUT CONTRAST MRA HEAD  WITHOUT CONTRAST TECHNIQUE: Multiplanar, multi-echo pulse sequences of the brain and surrounding structures were acquired without intravenous contrast. Angiographic images of the Circle of Willis were acquired using MRA technique without intravenous contrast. COMPARISON:  Prior study from 12/06/2021. FINDINGS: MRI HEAD FINDINGS Brain: Age-related cerebral volume loss. Patchy T2/FLAIR hyperintensity involving the periventricular deep white matter both cerebral hemispheres as well as the pons, consistent with chronic small vessel ischemic disease, mild in nature. Encephalomalacia and gliosis involving the right frontal operculum, consistent with a chronic right MCA distribution infarct. Additional small remote left PCA territory infarct involving the left occipital lobe noted. Few scattered remote cerebellar infarcts noted, left greater than right. Few scattered remote lacunar infarcts present about the deep gray nuclei bilaterally. Approximate 3.4 cm area of restricted diffusion involving the anterior left insula and overlying operculum, consistent with acute left MCA distribution infarct. No significant mass effect or associated hemorrhage. No other evidence for acute or subacute ischemia. No other acute intracranial hemorrhage. No significant chronic intracranial blood products. No mass lesion, midline shift or mass effect. No hydrocephalus or extra-axial fluid collection. Pituitary gland and suprasellar region within normal limits. Vascular: Major intracranial vascular flow voids are maintained. Skull and upper cervical spine: Craniocervical junction within normal limits. Bone marrow signal intensity normal. No scalp soft tissue abnormality. Sinuses/Orbits: Prior bilateral ocular lens replacement. Paranasal sinuses are clear. Large right mastoid effusion noted. Additional trace left mastoid effusion. Visualized nasopharynx unremarkable. Other: None. MRA HEAD FINDINGS Anterior circulation: Examination mildly  degraded by motion artifact. Both internal carotid arteries are patent through the siphons without significant stenosis or other abnormality. A1 segments, anterior communicating artery complex common anterior cerebral arteries patent without stenosis. No M1 stenosis or occlusion. Right MCA branches patent and well perfused. On the left, there is a probable acute left M3 occlusion (series 5, image 177), corresponding with the evolving left MCA distribution infarct. Remainder of the left MCA branches remain patent and perfused. Posterior circulation: Both V4 segments patent without stenosis. Both PICA patent at their origins. Basilar widely patent without stenosis. Superior cerebellar arteries patent bilaterally. Both PCAs primarily supplied via the basilar. Left PCA patent to its distal aspect without significant stenosis. Focal severe distal right P2 stenosis (series 1032, image 13). Right PCA  otherwise patent as well. Anatomic variants: None significant.  No intracranial aneurysm. IMPRESSION: MRI HEAD: 1. 3.4 cm acute involving the anterior left insula and overlying operculum, left MCA distribution. No associated hemorrhage or significant mass effect. 2. Underlying age-related cerebral volume loss with chronic microvascular ischemic disease, with multiple remote infarcts as above. MRA HEAD: 1. Probable acute left M3 occlusion, corresponding with the evolving left MCA distribution infarct. 2. Focal severe distal right P2 stenosis. 3. Otherwise wide patency of the major intracranial arterial vasculature. Electronically Signed   By: Rise Mu M.D.   On: 07/30/2023 18:44

## 2023-08-25 ENCOUNTER — Other Ambulatory Visit: Payer: Self-pay | Admitting: Interventional Radiology

## 2023-08-25 ENCOUNTER — Inpatient Hospital Stay (HOSPITAL_BASED_OUTPATIENT_CLINIC_OR_DEPARTMENT_OTHER): Payer: Medicare Other | Admitting: Hematology

## 2023-08-25 VITALS — BP 134/66 | HR 64 | Temp 97.9°F | Resp 17 | Ht 59.0 in | Wt 118.0 lb

## 2023-08-25 DIAGNOSIS — Z9911 Dependence on respirator [ventilator] status: Secondary | ICD-10-CM | POA: Diagnosis not present

## 2023-08-25 DIAGNOSIS — E872 Acidosis, unspecified: Secondary | ICD-10-CM | POA: Diagnosis not present

## 2023-08-25 DIAGNOSIS — I351 Nonrheumatic aortic (valve) insufficiency: Secondary | ICD-10-CM | POA: Diagnosis not present

## 2023-08-25 DIAGNOSIS — N1831 Chronic kidney disease, stage 3a: Secondary | ICD-10-CM | POA: Diagnosis not present

## 2023-08-25 DIAGNOSIS — Z515 Encounter for palliative care: Secondary | ICD-10-CM | POA: Diagnosis not present

## 2023-08-25 DIAGNOSIS — C220 Liver cell carcinoma: Secondary | ICD-10-CM

## 2023-08-25 DIAGNOSIS — E43 Unspecified severe protein-calorie malnutrition: Secondary | ICD-10-CM | POA: Diagnosis not present

## 2023-08-25 DIAGNOSIS — E1122 Type 2 diabetes mellitus with diabetic chronic kidney disease: Secondary | ICD-10-CM | POA: Diagnosis not present

## 2023-08-25 DIAGNOSIS — D689 Coagulation defect, unspecified: Secondary | ICD-10-CM | POA: Diagnosis not present

## 2023-08-25 DIAGNOSIS — I618 Other nontraumatic intracerebral hemorrhage: Secondary | ICD-10-CM | POA: Diagnosis not present

## 2023-08-25 DIAGNOSIS — G928 Other toxic encephalopathy: Secondary | ICD-10-CM | POA: Diagnosis not present

## 2023-08-25 DIAGNOSIS — K7460 Unspecified cirrhosis of liver: Secondary | ICD-10-CM | POA: Diagnosis not present

## 2023-08-25 DIAGNOSIS — I352 Nonrheumatic aortic (valve) stenosis with insufficiency: Secondary | ICD-10-CM | POA: Diagnosis not present

## 2023-08-25 DIAGNOSIS — R1310 Dysphagia, unspecified: Secondary | ICD-10-CM | POA: Diagnosis not present

## 2023-08-25 DIAGNOSIS — K219 Gastro-esophageal reflux disease without esophagitis: Secondary | ICD-10-CM | POA: Diagnosis not present

## 2023-08-25 DIAGNOSIS — E1169 Type 2 diabetes mellitus with other specified complication: Secondary | ICD-10-CM | POA: Diagnosis not present

## 2023-08-25 DIAGNOSIS — I4819 Other persistent atrial fibrillation: Secondary | ICD-10-CM | POA: Diagnosis not present

## 2023-08-25 DIAGNOSIS — E870 Hyperosmolality and hypernatremia: Secondary | ICD-10-CM | POA: Diagnosis not present

## 2023-08-25 DIAGNOSIS — I6389 Other cerebral infarction: Secondary | ICD-10-CM | POA: Diagnosis not present

## 2023-08-25 DIAGNOSIS — Z4682 Encounter for fitting and adjustment of non-vascular catheter: Secondary | ICD-10-CM | POA: Diagnosis not present

## 2023-08-25 DIAGNOSIS — E1159 Type 2 diabetes mellitus with other circulatory complications: Secondary | ICD-10-CM | POA: Diagnosis not present

## 2023-08-25 DIAGNOSIS — M519 Unspecified thoracic, thoracolumbar and lumbosacral intervertebral disc disorder: Secondary | ICD-10-CM | POA: Diagnosis not present

## 2023-08-25 DIAGNOSIS — E46 Unspecified protein-calorie malnutrition: Secondary | ICD-10-CM | POA: Diagnosis not present

## 2023-08-25 DIAGNOSIS — Z95828 Presence of other vascular implants and grafts: Secondary | ICD-10-CM | POA: Diagnosis not present

## 2023-08-25 DIAGNOSIS — I469 Cardiac arrest, cause unspecified: Secondary | ICD-10-CM | POA: Diagnosis not present

## 2023-08-25 DIAGNOSIS — D696 Thrombocytopenia, unspecified: Secondary | ICD-10-CM | POA: Diagnosis not present

## 2023-08-25 DIAGNOSIS — I8500 Esophageal varices without bleeding: Secondary | ICD-10-CM | POA: Diagnosis not present

## 2023-08-25 DIAGNOSIS — I8510 Secondary esophageal varices without bleeding: Secondary | ICD-10-CM | POA: Diagnosis not present

## 2023-08-25 DIAGNOSIS — K7682 Hepatic encephalopathy: Secondary | ICD-10-CM | POA: Diagnosis not present

## 2023-08-25 DIAGNOSIS — D631 Anemia in chronic kidney disease: Secondary | ICD-10-CM | POA: Diagnosis not present

## 2023-08-25 DIAGNOSIS — I5032 Chronic diastolic (congestive) heart failure: Secondary | ICD-10-CM | POA: Diagnosis not present

## 2023-08-25 DIAGNOSIS — I700 Atherosclerosis of aorta: Secondary | ICD-10-CM | POA: Diagnosis not present

## 2023-08-25 DIAGNOSIS — G936 Cerebral edema: Secondary | ICD-10-CM | POA: Diagnosis not present

## 2023-08-25 DIAGNOSIS — I2510 Atherosclerotic heart disease of native coronary artery without angina pectoris: Secondary | ICD-10-CM | POA: Diagnosis not present

## 2023-08-25 DIAGNOSIS — I152 Hypertension secondary to endocrine disorders: Secondary | ICD-10-CM | POA: Diagnosis not present

## 2023-08-25 DIAGNOSIS — Z66 Do not resuscitate: Secondary | ICD-10-CM | POA: Diagnosis not present

## 2023-08-25 DIAGNOSIS — K7290 Hepatic failure, unspecified without coma: Secondary | ICD-10-CM | POA: Diagnosis not present

## 2023-08-25 DIAGNOSIS — D630 Anemia in neoplastic disease: Secondary | ICD-10-CM | POA: Diagnosis not present

## 2023-08-25 DIAGNOSIS — K760 Fatty (change of) liver, not elsewhere classified: Secondary | ICD-10-CM | POA: Diagnosis not present

## 2023-08-25 DIAGNOSIS — I130 Hypertensive heart and chronic kidney disease with heart failure and stage 1 through stage 4 chronic kidney disease, or unspecified chronic kidney disease: Secondary | ICD-10-CM | POA: Diagnosis not present

## 2023-08-25 DIAGNOSIS — Z1152 Encounter for screening for COVID-19: Secondary | ICD-10-CM | POA: Diagnosis not present

## 2023-08-25 DIAGNOSIS — I6523 Occlusion and stenosis of bilateral carotid arteries: Secondary | ICD-10-CM | POA: Diagnosis not present

## 2023-08-25 DIAGNOSIS — N179 Acute kidney failure, unspecified: Secondary | ICD-10-CM | POA: Diagnosis not present

## 2023-08-25 DIAGNOSIS — I10 Essential (primary) hypertension: Secondary | ICD-10-CM | POA: Diagnosis not present

## 2023-08-25 DIAGNOSIS — E785 Hyperlipidemia, unspecified: Secondary | ICD-10-CM | POA: Diagnosis not present

## 2023-08-25 NOTE — Patient Instructions (Addendum)
[facility]  Discharge Instructions  You were seen and examined today by Dr. Ellin Saba.  Dr. 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 This is typically treated with a combination of antibodies and immunotherapy but one of those medications increases the risk of stroke. Unfortunately, you are also not strong enough to undergo treatment at this time.  Dr. Ellin Saba has recommended a referral to palliative care with [facility] so that they are able to focus on her comfort moving forward. They will transition to Hospice as appropriate.   Please call us if you need [place] in the future.  Thank you for choosing [facility] to provide your oncology and hematology care.   To afford each patient quality time with our provider, please arrive at least 15 minutes before your scheduled appointment time. You may need to reschedule your appointment if you arrive late (10 or more minutes). Arriving late affects you and other patients whose appointments are after yours.  Also, if you miss three or more appointments without notifying the office, you may be dismissed from the clinic at the provider's discretion.    Again, thank you for choosing [facility].  Our hope is that these requests will decrease the amount of time that you wait before being seen by our physicians.   If you have a lab appointment with the Cancer Center - please note that after April 8th, all labs will be drawn in the cancer center.  You do not have to check in or register with the main entrance as you have in the past but will complete your check-in at the cancer center.            _____________________________________________________________  Should you have questions after your visit to [facility], please contact our office at [phone]  and follow the prompts.  Our office hours are 8:00 a.m. to 4:30 p.m. Monday - Thursday and 8:00 a.m. to 2:30 p.m. Friday.  Please note that voicemails left after 4:00 p.m. may not be returned until the following business day.  We are closed weekends and all major holidays.  You do have access to a nurse 24-7, just call the main number to the clinic [phone] and do not press any options, hold on the line and a nurse will answer the phone.    For prescription refill requests, have your pharmacy contact our office and allow 72 hours.    Masks are no longer required in the cancer centers. If you would like for your care team to wear a mask while they are taking care of you, please let them know. You may have one support person who is at least 81 years old accompany you for your appointments.

## 2023-08-26 DIAGNOSIS — I700 Atherosclerosis of aorta: Secondary | ICD-10-CM | POA: Diagnosis not present

## 2023-08-26 DIAGNOSIS — M519 Unspecified thoracic, thoracolumbar and lumbosacral intervertebral disc disorder: Secondary | ICD-10-CM | POA: Diagnosis not present

## 2023-08-26 DIAGNOSIS — K219 Gastro-esophageal reflux disease without esophagitis: Secondary | ICD-10-CM | POA: Diagnosis not present

## 2023-08-26 DIAGNOSIS — E46 Unspecified protein-calorie malnutrition: Secondary | ICD-10-CM | POA: Diagnosis not present

## 2023-08-26 DIAGNOSIS — I8500 Esophageal varices without bleeding: Secondary | ICD-10-CM | POA: Diagnosis not present

## 2023-08-26 DIAGNOSIS — K7460 Unspecified cirrhosis of liver: Secondary | ICD-10-CM | POA: Diagnosis not present

## 2023-08-26 DIAGNOSIS — I351 Nonrheumatic aortic (valve) insufficiency: Secondary | ICD-10-CM | POA: Diagnosis not present

## 2023-08-26 DIAGNOSIS — D630 Anemia in neoplastic disease: Secondary | ICD-10-CM | POA: Diagnosis not present

## 2023-08-26 DIAGNOSIS — E1169 Type 2 diabetes mellitus with other specified complication: Secondary | ICD-10-CM | POA: Diagnosis not present

## 2023-08-26 DIAGNOSIS — I2510 Atherosclerotic heart disease of native coronary artery without angina pectoris: Secondary | ICD-10-CM | POA: Diagnosis not present

## 2023-08-26 DIAGNOSIS — C220 Liver cell carcinoma: Secondary | ICD-10-CM | POA: Diagnosis not present

## 2023-08-26 DIAGNOSIS — N1831 Chronic kidney disease, stage 3a: Secondary | ICD-10-CM | POA: Diagnosis not present

## 2023-08-26 DIAGNOSIS — K760 Fatty (change of) liver, not elsewhere classified: Secondary | ICD-10-CM | POA: Diagnosis not present

## 2023-08-26 DIAGNOSIS — I6523 Occlusion and stenosis of bilateral carotid arteries: Secondary | ICD-10-CM | POA: Diagnosis not present

## 2023-08-26 DIAGNOSIS — D696 Thrombocytopenia, unspecified: Secondary | ICD-10-CM | POA: Diagnosis not present

## 2023-08-26 DIAGNOSIS — I4819 Other persistent atrial fibrillation: Secondary | ICD-10-CM | POA: Diagnosis not present

## 2023-08-26 DIAGNOSIS — E785 Hyperlipidemia, unspecified: Secondary | ICD-10-CM | POA: Diagnosis not present

## 2023-08-26 DIAGNOSIS — D631 Anemia in chronic kidney disease: Secondary | ICD-10-CM | POA: Diagnosis not present

## 2023-08-26 DIAGNOSIS — I5032 Chronic diastolic (congestive) heart failure: Secondary | ICD-10-CM | POA: Diagnosis not present

## 2023-08-26 DIAGNOSIS — E1159 Type 2 diabetes mellitus with other circulatory complications: Secondary | ICD-10-CM | POA: Diagnosis not present

## 2023-08-26 DIAGNOSIS — E1122 Type 2 diabetes mellitus with diabetic chronic kidney disease: Secondary | ICD-10-CM | POA: Diagnosis not present

## 2023-08-26 DIAGNOSIS — I152 Hypertension secondary to endocrine disorders: Secondary | ICD-10-CM | POA: Diagnosis not present

## 2023-08-27 DIAGNOSIS — E1122 Type 2 diabetes mellitus with diabetic chronic kidney disease: Secondary | ICD-10-CM | POA: Diagnosis not present

## 2023-08-27 DIAGNOSIS — K219 Gastro-esophageal reflux disease without esophagitis: Secondary | ICD-10-CM | POA: Diagnosis not present

## 2023-08-27 DIAGNOSIS — I2510 Atherosclerotic heart disease of native coronary artery without angina pectoris: Secondary | ICD-10-CM | POA: Diagnosis not present

## 2023-08-27 DIAGNOSIS — I8500 Esophageal varices without bleeding: Secondary | ICD-10-CM | POA: Diagnosis not present

## 2023-08-27 DIAGNOSIS — I6523 Occlusion and stenosis of bilateral carotid arteries: Secondary | ICD-10-CM | POA: Diagnosis not present

## 2023-08-27 DIAGNOSIS — D630 Anemia in neoplastic disease: Secondary | ICD-10-CM | POA: Diagnosis not present

## 2023-08-27 DIAGNOSIS — E1169 Type 2 diabetes mellitus with other specified complication: Secondary | ICD-10-CM | POA: Diagnosis not present

## 2023-08-27 DIAGNOSIS — I351 Nonrheumatic aortic (valve) insufficiency: Secondary | ICD-10-CM | POA: Diagnosis not present

## 2023-08-27 DIAGNOSIS — N1831 Chronic kidney disease, stage 3a: Secondary | ICD-10-CM | POA: Diagnosis not present

## 2023-08-27 DIAGNOSIS — I4819 Other persistent atrial fibrillation: Secondary | ICD-10-CM | POA: Diagnosis not present

## 2023-08-27 DIAGNOSIS — E785 Hyperlipidemia, unspecified: Secondary | ICD-10-CM | POA: Diagnosis not present

## 2023-08-27 DIAGNOSIS — I5032 Chronic diastolic (congestive) heart failure: Secondary | ICD-10-CM | POA: Diagnosis not present

## 2023-08-27 DIAGNOSIS — I152 Hypertension secondary to endocrine disorders: Secondary | ICD-10-CM | POA: Diagnosis not present

## 2023-08-27 DIAGNOSIS — E1159 Type 2 diabetes mellitus with other circulatory complications: Secondary | ICD-10-CM | POA: Diagnosis not present

## 2023-08-27 DIAGNOSIS — D696 Thrombocytopenia, unspecified: Secondary | ICD-10-CM | POA: Diagnosis not present

## 2023-08-27 DIAGNOSIS — I700 Atherosclerosis of aorta: Secondary | ICD-10-CM | POA: Diagnosis not present

## 2023-08-27 DIAGNOSIS — C220 Liver cell carcinoma: Secondary | ICD-10-CM | POA: Diagnosis not present

## 2023-08-27 DIAGNOSIS — M519 Unspecified thoracic, thoracolumbar and lumbosacral intervertebral disc disorder: Secondary | ICD-10-CM | POA: Diagnosis not present

## 2023-08-27 DIAGNOSIS — D631 Anemia in chronic kidney disease: Secondary | ICD-10-CM | POA: Diagnosis not present

## 2023-08-27 DIAGNOSIS — K7460 Unspecified cirrhosis of liver: Secondary | ICD-10-CM | POA: Diagnosis not present

## 2023-08-27 DIAGNOSIS — E46 Unspecified protein-calorie malnutrition: Secondary | ICD-10-CM | POA: Diagnosis not present

## 2023-08-27 DIAGNOSIS — K760 Fatty (change of) liver, not elsewhere classified: Secondary | ICD-10-CM | POA: Diagnosis not present

## 2023-08-27 NOTE — Progress Notes (Signed)
Reason for follow up: The patient is seen in follow up today via virtual telephone clinic visit s/p segment II radiation segmentectomy on 10/10/22  Referring Physician(s): Levon Hedger Mayorga,D/Katragadda,S   History of present illness: HPI from last clinic visit 05/15/23.  [name] is a 82 y.o. female with history of recent incidentally discovered and now biopsy proven left lobe unifocal hepatocellular carcinoma.  She presents today via virtual telephone visit accompanied by her grand daughter, [name] [phone]).     [name] was admitted to the hospital in August 2023 for sepsis/UTI at which time a CT was performed which demonstrated cirrhosis and a concerning 4.5 cm left lobe liver mass. This prompted further evaluation with MRI which demonstrated a 4.8 cm segment II LR-M mass.  Ultrasound guided biopsy was performed on 08/15/22 which confirmed hepatocellular carcinoma.     She underwent Tc-MAA mapping study on 09/26/22 which required celiac artery stent placement for catheterization of the hepatic artery.  This was followed by transarterial radioembolization to the segment II branches supplying the biopsy proven HCC on 10/10/22.  The procedure went as planned without complication.     She continues to follow with Dr. Levon Hedger.  She had an MRI done on 05/08/23.     Overall doing well.  Good appetite.  She is able to ambulate.  She remains compliant with lactulose without reported encephalopathy.  At our last visit she reported feeling well and was without significant symptoms or complaints. Her initial follow up MRI demonstrated non-viability of the treated mass with significant decrease in size and her 6 month follow up demonstrated similar non-viable mass without evidence of new masses. Unfortunately her  a-FP lab had increased significantly, suggestive of possible metastatic disease. I reached out to Drs. Levon Hedger and Ellin Saba to recommend a PET-CT with repeat a-FP and a  follow up with IR in 3 months.      In the interim, she was hospitalized at [facility] 07/30/23-08/01/23 for altered mental status due to an acute ischemic CVA. She was seen in IR 07/31/23 for a right thoracentesis with 1.3 L of fluid removed.  She last followed up with Dr. Ellin Saba 08/25/23 where he discussed recent image findings consistent with metastatic disease. A referral has been made to palliative care.   The patient presents today for follow up via virtual tele-health visit. She is represented per usual by her granddaughter, [name].  [name] is not speaking much now.  Overall they are focusing on comfort.  No further intervention or treatment will be pursued.  Past Medical History:  Diagnosis Date   Anemia    Anxiety    Aortic insufficiency    Moderate   Atrial fibrillation (HCC)    Back pain    Carotid stenosis, bilateral    Cirrhosis (HCC)    Closed left femoral fracture (HCC) 04/21/2017   Coronary artery disease    Stent x 2 RCA 1995, cardiac catheterization 09/2016 showing only mild atherosclerosis   DDD (degenerative disc disease), lumbar    Depression    Diabetes mellitus without complication (HCC)    Essential hypertension    Falls    GERD (gastroesophageal reflux disease)    Hiatal hernia    History of stroke    Hypercholesteremia    IBS (irritable bowel syndrome)    Non-alcoholic fatty liver disease    OSA (obstructive sleep apnea)    no CPAP   Pericardial effusion    a. small by echo 2018.  PSVT (paroxysmal supraventricular tachycardia) (HCC)    Recurrent UTI    Seizures (HCC) 2018   Stroke (HCC) 07/2019   several strokes within the last 10 years   Swallowing difficulty    Varices, esophageal (HCC)     Past Surgical History:  Procedure Laterality Date   ANAL FISSURE REPAIR  1972   APPENDECTOMY     BACK SURGERY  1985   CARDIAC CATHETERIZATION  [phone]   Stent to the proximal RCA after MI    CARDIAC CATHETERIZATION N/A 09/26/2016    Procedure: Left Heart Cath and Coronary Angiography;  Surgeon: Marykay Lex, MD;  Location: [facility] INVASIVE CV LAB;  Service: Cardiovascular;  Laterality: N/A;   CATARACT EXTRACTION W/PHACO Right 07/04/2014   Procedure: CATARACT EXTRACTION PHACO AND INTRAOCULAR LENS PLACEMENT (IOC);  Surgeon: Loraine Leriche T. Nile Riggs, MD;  Location: [facility];  Service: Ophthalmology;  Laterality: Right;  CDE:13.13   COLONOSCOPY     DILATION AND CURETTAGE OF UTERUS     x2   Epi Retinal Membrane Peel Left    ERCP     ESOPHAGEAL BANDING N/A 04/01/2013   Procedure: ESOPHAGEAL BANDING;  Surgeon: Malissa Hippo, MD;  Location: [facility];  Service: Endoscopy;  Laterality: N/A;   ESOPHAGEAL BANDING N/A 05/24/2013   Procedure: ESOPHAGEAL BANDING;  Surgeon: Malissa Hippo, MD;  Location: [facility];  Service: Endoscopy;  Laterality: N/A;   ESOPHAGEAL BANDING N/A 06/21/2014   Procedure: ESOPHAGEAL BANDING;  Surgeon: Malissa Hippo, MD;  Location: [facility];  Service: Endoscopy;  Laterality: N/A;   ESOPHAGEAL BANDING  06/15/2020   Procedure: ESOPHAGEAL BANDING;  Surgeon: Dolores Frame, MD;  Location: [facility];  Service: Gastroenterology;;   ESOPHAGOGASTRODUODENOSCOPY N/A 04/01/2013   Procedure: ESOPHAGOGASTRODUODENOSCOPY (EGD);  Surgeon: Malissa Hippo, MD;  Location: [facility];  Service: Endoscopy;  Laterality: N/A;  230-rescheduled to 8:30am Ann notified pt   ESOPHAGOGASTRODUODENOSCOPY N/A 05/24/2013   Procedure: ESOPHAGOGASTRODUODENOSCOPY (EGD);  Surgeon: Malissa Hippo, MD;  [facility]: [facility];  Service: Endoscopy;  Laterality: N/A;  730   ESOPHAGOGASTRODUODENOSCOPY N/A 06/21/2014   Procedure: ESOPHAGOGASTRODUODENOSCOPY (EGD);  Surgeon: Malissa Hippo, MD;  [facility]: [facility];  Service: Endoscopy;  Laterality: N/A;  930-rescheduled 10/14 @ 1200 Ann to notify pt   ESOPHAGOGASTRODUODENOSCOPY (EGD) WITH PROPOFOL N/A 06/15/2020   Procedure: ESOPHAGOGASTRODUODENOSCOPY (EGD) WITH PROPOFOL;   Surgeon: Dolores Frame, MD;  [facility]: [facility];  Service: Gastroenterology;  Laterality: N/A;  230   ESOPHAGOGASTRODUODENOSCOPY (EGD) WITH PROPOFOL N/A 07/27/2020   Procedure: ESOPHAGOGASTRODUODENOSCOPY (EGD) WITH PROPOFOL;  Surgeon: Dolores Frame, MD;  [facility]: [facility];  Service: Gastroenterology;  Laterality: N/A;  1245   ESOPHAGOGASTRODUODENOSCOPY (EGD) WITH PROPOFOL N/A 08/19/2022   Procedure: ESOPHAGOGASTRODUODENOSCOPY (EGD) WITH PROPOFOL;  Surgeon: Dolores Frame, MD;  Location: [facility];  Service: Gastroenterology;  Laterality: N/A;  130 ASA 3, pt knows to arrive at 6:15   EYE SURGERY  08   cataract surgery of the left eye   FEMUR IM NAIL Left 03/02/2017   Procedure: INTRAMEDULLARY (IM) NAIL FEMORAL;  Surgeon: Samson Frederic, MD;  Location: [facility] OR;  Service: Orthopedics;  Laterality: Left;   HARDWARE REMOVAL Right 01/17/2013   Procedure: REMOVAL OF HARDWARE AND EXCISION ULNAR STYLOID RIGHT WRIST;  Surgeon: Tami Ribas, MD;  Location: [facility];  Service: Orthopedics;  Laterality: Right;   implanted heart monitor  02/2020   to monitor Afib   IR 3D INDEPENDENT  WKST  09/26/2022   IR ANGIOGRAM SELECTIVE EACH ADDITIONAL VESSEL  09/26/2022   IR ANGIOGRAM SELECTIVE EACH ADDITIONAL VESSEL  09/26/2022   IR ANGIOGRAM SELECTIVE EACH ADDITIONAL VESSEL  09/26/2022   IR ANGIOGRAM SELECTIVE EACH ADDITIONAL VESSEL  09/26/2022   IR ANGIOGRAM SELECTIVE EACH ADDITIONAL VESSEL  09/26/2022   IR ANGIOGRAM SELECTIVE EACH ADDITIONAL VESSEL  10/10/2022   IR ANGIOGRAM SELECTIVE EACH ADDITIONAL VESSEL  10/10/2022   IR ANGIOGRAM SELECTIVE EACH ADDITIONAL VESSEL  10/10/2022   IR ANGIOGRAM VISCERAL SELECTIVE  09/26/2022   IR ANGIOGRAM VISCERAL SELECTIVE  10/10/2022   IR EMBO TUMOR ORGAN ISCHEMIA INFARCT INC GUIDE ROADMAPPING  10/10/2022   IR INTRAVASCULAR ULTRASOUND NON CORONARY  09/26/2022   IR RADIOLOGIST EVAL & MGMT  09/10/2022   IR RADIOLOGIST EVAL & MGMT   11/03/2022   IR RADIOLOGIST EVAL & MGMT  02/10/2023   IR RADIOLOGIST EVAL & MGMT  05/15/2023   IR TRANSCATH PLC STENT 1ST ART NOT LE CV CAR VERT CAR  09/26/2022   IR US GUIDE VASC ACCESS RIGHT  09/26/2022   IR US GUIDE VASC ACCESS RIGHT  10/10/2022   MYRINGOTOMY  2012   both ears   ORIF FEMUR FRACTURE Left 04/23/2017   Procedure: OPEN REDUCTION INTERNAL FIXATION (ORIF) DISTAL FEMUR FRACTURE (FRACTURE AROUND FEMORAL NAIL);  Surgeon: Myrene Galas, MD;  Location: [facility] OR;  Service: Orthopedics;  Laterality: Left;   TONSILLECTOMY     TUBAL LIGATION     VAGINAL HYSTERECTOMY  1972   WRIST SURGERY     rt wrist hardwear removal    Allergies: Tape  Medications: Prior to Admission medications   Medication Sig Start Date End Date Taking? Authorizing Provider  acetaminophen (TYLENOL) 500 MG tablet Take 1,000 mg by mouth at bedtime.    [provider]  aspirin EC 81 MG tablet Take 1 tablet (81 mg total) by mouth daily. Swallow whole. 09/26/22   Terrius Gentile, Thressa Sheller, MD  atorvastatin (LIPITOR) 80 MG tablet Take 1 tablet (80 mg total) by mouth at bedtime. 08/19/23   Sharee Holster, NP  Calcium Carbonate 500 (200 Ca) MG WAFR Take 1 tablet by mouth in the morning and at bedtime.    [provider]  CALCIUM CITRATE PO Take 200 mg by mouth 2 (two) times daily.    [provider]  carvedilol (COREG) 3.125 MG tablet Take 1 tablet (3.125 mg total) by mouth 2 (two) times daily with a meal. 08/19/23   Sharee Holster, NP  Cholecalciferol (VITAMIN D3) 50 MCG (2000 UT) TABS Take 2,000 Units by mouth daily.    [provider]  Coenzyme Q10 (CO Q-10) 100 MG CAPS Take 100 mg by mouth in the morning.    [provider]  dabigatran (PRADAXA) 150 MG CAPS capsule Take 1 capsule (150 mg total) by mouth every 12 (twelve) hours. 08/19/23   Sharee Holster, NP  diltiazem (CARDIZEM CD) 240 MG 24 hr capsule Take 1 capsule (240 mg total) by mouth daily. 08/19/23   Sharee Holster, NP   doxepin (SINEQUAN) 25 MG capsule Take 1 capsule (25 mg total) by mouth at bedtime. 08/19/23   Sharee Holster, NP  furosemide (LASIX) 40 MG tablet Take 1 tablet (40 mg total) by mouth daily. 08/19/23   Sharee Holster, NP  lactulose (CHRONULAC) 10 GM/15ML solution Take 60 mLs (40 g total) by mouth 3 (three) times daily. 08/19/23   Sharee Holster, NP  Multiple Vitamin (MULTIVITAMIN WITH MINERALS)  TABS tablet Take 1 tablet by mouth daily.    [provider]  Multiple Vitamins-Minerals (SENTRY SENIOR/LUTEIN) TABS Take by mouth. 500-300-250 mcg; amt: 1 tab; oral Once A Day; 09:00 AM [DX: Type 2 diabetes mellitus with other circulatory complications]    [provider]  Nutritional Supplements (PROSOURCE) LIQD Take 30 mLs by mouth 3 (three) times daily. 08/19/23   Sharee Holster, NP  omeprazole (PRILOSEC) 40 MG capsule Take 1 capsule (40 mg total) by mouth daily. 08/19/23   Sharee Holster, NP  potassium chloride SA (KLOR-CON M) 20 MEQ tablet Take 1 tablet (20 mEq total) by mouth 3 (three) times daily. 08/19/23   Sharee Holster, NP     Family History  Problem Relation Age of Onset   Diabetes Mother    Heart failure Father    Heart failure Maternal Aunt     Social History   Socioeconomic History   Marital status: Widowed    Spouse name: Not on file   Number of children: 2   Years of education: 2 years college   Highest education level: Some college, no degree  Occupational History    Employer: RETIRED  Tobacco Use   Smoking status: Former    Current packs/day: 0.00    Average packs/day: 0.3 packs/day for 50.0 years (12.5 ttl pk-yrs)    Types: Cigarettes    Start date: 07/09/1972    Quit date: 07/09/2022    Years since quitting: 1.1    Passive exposure: Current   Smokeless tobacco: Never   Tobacco comments:    12/23/21 1 pack Q two weeks.  Vaping Use   Vaping status: Never Used  Substance and Sexual Activity   Alcohol use: No    Alcohol/week: 0.0  standard drinks of alcohol   Drug use: No   Sexual activity: Not Currently    Birth control/protection: Surgical  Other Topics Concern   Not on file  Social History Narrative   12/23/21  Lives with 3 grandchildren, sone and dgtr-in -law are across the st   Social Drivers of Health   Financial Resource Strain: Low Risk  (09/15/2022)   Overall Financial Resource Strain (CARDIA)    Difficulty of Paying Living Expenses: Not hard at all  Food Insecurity: No Food Insecurity (07/31/2023)   Hunger Vital Sign    Worried About Running Out of Food in the Last Year: Never true    Ran Out of Food in the Last Year: Never true  Transportation Needs: No Transportation Needs (07/31/2023)   PRAPARE - Administrator, Civil Service (Medical): No    Lack of Transportation (Non-Medical): No  Physical Activity: Inactive (03/16/2019)   Exercise Vital Sign    Days of Exercise per Week: 0 days    Minutes of Exercise per Session: 0 min  Stress: No Stress Concern Present (03/16/2019)   [employer] of Occupational Health - Occupational Stress Questionnaire    Feeling of Stress : Not at all  Social Connections: Moderately Isolated (03/16/2019)   Social Connection and Isolation Panel [NHANES]    Frequency of Communication with Friends and Family: More than three times a week    Frequency of Social Gatherings with Friends and Family: Never    Attends Religious Services: 1 to 4 times per year    Active Member of [employer] or Organizations: No    Attends Banker Meetings: Never    Marital Status: Widowed     Vital Signs: There were  no vitals taken for this visit.  No physical exam was performed in lieu of virtual telephone visit.    Imaging: MR Abdomen 07/24/22    [facility] beam CT from hepatic angiogram 09/26/22    MR abdomen 01/27/23  LIRADS TR nonviable.  No new masses.   MRI abdomen 05/08/23  LIRADS TR nonviable.  No new masses.  MRI abdomen 08/18/23 IMPRESSION: 1. Study  performed without intravenous contrast material due to lack of intravenous access. 2. 2.9 x 2.4 cm soft tissue nodule in the region of the gastrohepatic ligament, likely a lymph node, increased in size from 2.0 x 1.7 cm when remeasured in a similar fashion on the prior study. This is compatible with metastatic disease. 3. 2.5 x 1.8 cm lesion in the posterior left hepatic lobe is not substantially changed from 2.9 x 1.7 cm on previous MRI. 4. Cirrhotic liver morphology. 5. Distended gallbladder with similar layering sludge/stones. There may be some trace pericholecystic fluid. 6. Moderate right pleural effusion, similar to prior.     Labs:  CBC: Recent Labs    07/31/23 0439 08/03/23 0800 08/10/23 0605 08/11/23 1341  WBC 4.8 6.6 5.4 7.8  HGB 10.8* 11.7* 11.6* 13.3  HCT 32.0* 36.0 33.9* 39.6  PLT 100* 106* 138* 170    COAGS: Recent Labs    09/26/22 0756 10/10/22 0832 05/04/23 1429  INR 1.3* 1.6* 1.3*  APTT 33  --   --     BMP: Recent Labs    08/03/23 0800 08/10/23 0605 08/11/23 1341 08/13/23 0500  NA 136 134* 138 139  K 3.3* 3.2* 3.6 3.9  CL 104 103 102 106  CO2 26 25 24 23   GLUCOSE 86 108* 175* 116*  BUN 12 15 15 16   CALCIUM 7.7* 8.9 9.6 9.7  CREATININE 1.10* 1.28* 1.53* 1.55*  GFRNONAA 51* 42* 34* 33*    LIVER FUNCTION TESTS: Recent Labs    06/02/23 0955 07/30/23 1450 07/31/23 0439 08/01/23 0421 08/11/23 1341  BILITOT 1.8* 3.8* 2.2*  --  1.6*  AST 38 38 27  --  52*  ALT 23 21 17   --  30  ALKPHOS 138* 121 86  --  124  PROT 5.8* 6.2* 4.7*  --  6.0*  ALBUMIN 2.8* 2.9* 2.2* 2.3* 2.5*    Assessment and Plan: 82 year old female with NASH cirrhosis (Child Pugh B, ALBI 2) and biopsy proven unifocal left lobe hepatocellular carcinoma, possibly scirrhous subtype. She is now status post Tc-MAA mapping study with celiac artery stent placement on 09/26/22 followed by radiation segmentectomy to segment II mass on 10/10/22. Unfortunately she has suffered a  stroke recently.  Additionally, recent MRI shows enlarging perihepatic lymph node compatible with metastasis, corroborated by rising AFP levels.  She is now palliative care.  Follow up with IR as needed.  Marliss Coots, MD Pager: [phone]    I spent a total of 25 Minutes in virtual telephone clinical consultation, greater than 50% of which was counseling/coordinating care for hepatocellular carcinoma.

## 2023-08-28 ENCOUNTER — Emergency Department (HOSPITAL_COMMUNITY): Payer: Medicare Other

## 2023-08-28 ENCOUNTER — Inpatient Hospital Stay (HOSPITAL_COMMUNITY)
Admission: EM | Admit: 2023-08-28 | Discharge: 2023-09-09 | DRG: 064 | Disposition: E | Payer: Medicare Other | Attending: Pulmonary Disease | Admitting: Pulmonary Disease

## 2023-08-28 ENCOUNTER — Other Ambulatory Visit: Payer: Self-pay

## 2023-08-28 ENCOUNTER — Inpatient Hospital Stay (HOSPITAL_COMMUNITY): Payer: Medicare Other

## 2023-08-28 ENCOUNTER — Ambulatory Visit
Admission: RE | Admit: 2023-08-28 | Discharge: 2023-08-28 | Disposition: A | Discharge status: Home / Self Care | Payer: Medicare Other | Source: Ambulatory Visit | Attending: Interventional Radiology | Admitting: Interventional Radiology

## 2023-08-28 DIAGNOSIS — K7682 Hepatic encephalopathy: Secondary | ICD-10-CM | POA: Diagnosis not present

## 2023-08-28 DIAGNOSIS — K7460 Unspecified cirrhosis of liver: Secondary | ICD-10-CM | POA: Diagnosis present

## 2023-08-28 DIAGNOSIS — E878 Other disorders of electrolyte and fluid balance, not elsewhere classified: Secondary | ICD-10-CM | POA: Diagnosis present

## 2023-08-28 DIAGNOSIS — E870 Hyperosmolality and hypernatremia: Secondary | ICD-10-CM | POA: Diagnosis present

## 2023-08-28 DIAGNOSIS — E1122 Type 2 diabetes mellitus with diabetic chronic kidney disease: Secondary | ICD-10-CM | POA: Diagnosis not present

## 2023-08-28 DIAGNOSIS — D630 Anemia in neoplastic disease: Secondary | ICD-10-CM | POA: Diagnosis not present

## 2023-08-28 DIAGNOSIS — I152 Hypertension secondary to endocrine disorders: Secondary | ICD-10-CM | POA: Diagnosis not present

## 2023-08-28 DIAGNOSIS — E7800 Pure hypercholesterolemia, unspecified: Secondary | ICD-10-CM | POA: Diagnosis present

## 2023-08-28 DIAGNOSIS — I6523 Occlusion and stenosis of bilateral carotid arteries: Secondary | ICD-10-CM | POA: Diagnosis not present

## 2023-08-28 DIAGNOSIS — N179 Acute kidney failure, unspecified: Secondary | ICD-10-CM | POA: Diagnosis not present

## 2023-08-28 DIAGNOSIS — Z1152 Encounter for screening for COVID-19: Secondary | ICD-10-CM | POA: Diagnosis not present

## 2023-08-28 DIAGNOSIS — D689 Coagulation defect, unspecified: Secondary | ICD-10-CM | POA: Diagnosis not present

## 2023-08-28 DIAGNOSIS — E860 Dehydration: Secondary | ICD-10-CM | POA: Diagnosis not present

## 2023-08-28 DIAGNOSIS — E43 Unspecified severe protein-calorie malnutrition: Secondary | ICD-10-CM | POA: Diagnosis present

## 2023-08-28 DIAGNOSIS — D696 Thrombocytopenia, unspecified: Secondary | ICD-10-CM | POA: Diagnosis not present

## 2023-08-28 DIAGNOSIS — E872 Acidosis, unspecified: Secondary | ICD-10-CM | POA: Diagnosis present

## 2023-08-28 DIAGNOSIS — E162 Hypoglycemia, unspecified: Secondary | ICD-10-CM | POA: Diagnosis present

## 2023-08-28 DIAGNOSIS — Z95828 Presence of other vascular implants and grafts: Secondary | ICD-10-CM

## 2023-08-28 DIAGNOSIS — K219 Gastro-esophageal reflux disease without esophagitis: Secondary | ICD-10-CM | POA: Diagnosis present

## 2023-08-28 DIAGNOSIS — I4719 Other supraventricular tachycardia: Secondary | ICD-10-CM | POA: Diagnosis present

## 2023-08-28 DIAGNOSIS — I4819 Other persistent atrial fibrillation: Secondary | ICD-10-CM | POA: Diagnosis present

## 2023-08-28 DIAGNOSIS — Z515 Encounter for palliative care: Secondary | ICD-10-CM | POA: Diagnosis not present

## 2023-08-28 DIAGNOSIS — I8510 Secondary esophageal varices without bleeding: Secondary | ICD-10-CM | POA: Diagnosis not present

## 2023-08-28 DIAGNOSIS — I618 Other nontraumatic intracerebral hemorrhage: Secondary | ICD-10-CM | POA: Diagnosis not present

## 2023-08-28 DIAGNOSIS — E1159 Type 2 diabetes mellitus with other circulatory complications: Secondary | ICD-10-CM | POA: Diagnosis present

## 2023-08-28 DIAGNOSIS — K760 Fatty (change of) liver, not elsewhere classified: Secondary | ICD-10-CM | POA: Diagnosis not present

## 2023-08-28 DIAGNOSIS — R4020 Unspecified coma: Secondary | ICD-10-CM | POA: Diagnosis not present

## 2023-08-28 DIAGNOSIS — Z8249 Family history of ischemic heart disease and other diseases of the circulatory system: Secondary | ICD-10-CM

## 2023-08-28 DIAGNOSIS — Z9911 Dependence on respirator [ventilator] status: Secondary | ICD-10-CM

## 2023-08-28 DIAGNOSIS — I130 Hypertensive heart and chronic kidney disease with heart failure and stage 1 through stage 4 chronic kidney disease, or unspecified chronic kidney disease: Secondary | ICD-10-CM | POA: Diagnosis present

## 2023-08-28 DIAGNOSIS — E46 Unspecified protein-calorie malnutrition: Secondary | ICD-10-CM | POA: Insufficient documentation

## 2023-08-28 DIAGNOSIS — I351 Nonrheumatic aortic (valve) insufficiency: Secondary | ICD-10-CM | POA: Diagnosis not present

## 2023-08-28 DIAGNOSIS — G9341 Metabolic encephalopathy: Secondary | ICD-10-CM | POA: Diagnosis present

## 2023-08-28 DIAGNOSIS — I9589 Other hypotension: Secondary | ICD-10-CM | POA: Diagnosis not present

## 2023-08-28 DIAGNOSIS — Z833 Family history of diabetes mellitus: Secondary | ICD-10-CM

## 2023-08-28 DIAGNOSIS — I2510 Atherosclerotic heart disease of native coronary artery without angina pectoris: Secondary | ICD-10-CM | POA: Diagnosis not present

## 2023-08-28 DIAGNOSIS — G936 Cerebral edema: Secondary | ICD-10-CM | POA: Diagnosis present

## 2023-08-28 DIAGNOSIS — E1169 Type 2 diabetes mellitus with other specified complication: Secondary | ICD-10-CM | POA: Diagnosis not present

## 2023-08-28 DIAGNOSIS — Z66 Do not resuscitate: Secondary | ICD-10-CM | POA: Diagnosis not present

## 2023-08-28 DIAGNOSIS — I6389 Other cerebral infarction: Secondary | ICD-10-CM | POA: Diagnosis not present

## 2023-08-28 DIAGNOSIS — Z79899 Other long term (current) drug therapy: Secondary | ICD-10-CM

## 2023-08-28 DIAGNOSIS — M519 Unspecified thoracic, thoracolumbar and lumbosacral intervertebral disc disorder: Secondary | ICD-10-CM | POA: Diagnosis not present

## 2023-08-28 DIAGNOSIS — I352 Nonrheumatic aortic (valve) stenosis with insufficiency: Secondary | ICD-10-CM | POA: Diagnosis not present

## 2023-08-28 DIAGNOSIS — Z7902 Long term (current) use of antithrombotics/antiplatelets: Secondary | ICD-10-CM

## 2023-08-28 DIAGNOSIS — L89151 Pressure ulcer of sacral region, stage 1: Secondary | ICD-10-CM | POA: Diagnosis present

## 2023-08-28 DIAGNOSIS — N183 Chronic kidney disease, stage 3 unspecified: Secondary | ICD-10-CM | POA: Diagnosis present

## 2023-08-28 DIAGNOSIS — Z955 Presence of coronary angioplasty implant and graft: Secondary | ICD-10-CM

## 2023-08-28 DIAGNOSIS — K7581 Nonalcoholic steatohepatitis (NASH): Secondary | ICD-10-CM | POA: Diagnosis present

## 2023-08-28 DIAGNOSIS — N1831 Chronic kidney disease, stage 3a: Secondary | ICD-10-CM | POA: Diagnosis present

## 2023-08-28 DIAGNOSIS — Z87891 Personal history of nicotine dependence: Secondary | ICD-10-CM

## 2023-08-28 DIAGNOSIS — Z7982 Long term (current) use of aspirin: Secondary | ICD-10-CM

## 2023-08-28 DIAGNOSIS — I8500 Esophageal varices without bleeding: Secondary | ICD-10-CM | POA: Diagnosis present

## 2023-08-28 DIAGNOSIS — D72829 Elevated white blood cell count, unspecified: Secondary | ICD-10-CM | POA: Diagnosis present

## 2023-08-28 DIAGNOSIS — E785 Hyperlipidemia, unspecified: Secondary | ICD-10-CM | POA: Diagnosis not present

## 2023-08-28 DIAGNOSIS — G4733 Obstructive sleep apnea (adult) (pediatric): Secondary | ICD-10-CM | POA: Diagnosis present

## 2023-08-28 DIAGNOSIS — G928 Other toxic encephalopathy: Secondary | ICD-10-CM | POA: Diagnosis not present

## 2023-08-28 DIAGNOSIS — K7290 Hepatic failure, unspecified without coma: Secondary | ICD-10-CM | POA: Diagnosis not present

## 2023-08-28 DIAGNOSIS — I5032 Chronic diastolic (congestive) heart failure: Secondary | ICD-10-CM | POA: Diagnosis not present

## 2023-08-28 DIAGNOSIS — C220 Liver cell carcinoma: Secondary | ICD-10-CM | POA: Diagnosis not present

## 2023-08-28 DIAGNOSIS — I6930 Unspecified sequelae of cerebral infarction: Secondary | ICD-10-CM | POA: Diagnosis not present

## 2023-08-28 DIAGNOSIS — I10 Essential (primary) hypertension: Secondary | ICD-10-CM | POA: Diagnosis not present

## 2023-08-28 DIAGNOSIS — G40909 Epilepsy, unspecified, not intractable, without status epilepticus: Secondary | ICD-10-CM

## 2023-08-28 DIAGNOSIS — D631 Anemia in chronic kidney disease: Secondary | ICD-10-CM | POA: Diagnosis not present

## 2023-08-28 DIAGNOSIS — I700 Atherosclerosis of aorta: Secondary | ICD-10-CM | POA: Diagnosis not present

## 2023-08-28 DIAGNOSIS — Z8673 Personal history of transient ischemic attack (TIA), and cerebral infarction without residual deficits: Secondary | ICD-10-CM

## 2023-08-28 DIAGNOSIS — Z6823 Body mass index (BMI) 23.0-23.9, adult: Secondary | ICD-10-CM

## 2023-08-28 DIAGNOSIS — R1310 Dysphagia, unspecified: Secondary | ICD-10-CM

## 2023-08-28 DIAGNOSIS — E11649 Type 2 diabetes mellitus with hypoglycemia without coma: Secondary | ICD-10-CM | POA: Diagnosis present

## 2023-08-28 DIAGNOSIS — I469 Cardiac arrest, cause unspecified: Secondary | ICD-10-CM | POA: Diagnosis not present

## 2023-08-28 DIAGNOSIS — E1140 Type 2 diabetes mellitus with diabetic neuropathy, unspecified: Secondary | ICD-10-CM | POA: Diagnosis not present

## 2023-08-28 DIAGNOSIS — I639 Cerebral infarction, unspecified: Secondary | ICD-10-CM | POA: Diagnosis present

## 2023-08-28 DIAGNOSIS — R911 Solitary pulmonary nodule: Secondary | ICD-10-CM | POA: Diagnosis present

## 2023-08-28 DIAGNOSIS — Z4682 Encounter for fitting and adjustment of non-vascular catheter: Secondary | ICD-10-CM | POA: Diagnosis not present

## 2023-08-28 DIAGNOSIS — Z993 Dependence on wheelchair: Secondary | ICD-10-CM

## 2023-08-28 HISTORY — PX: IR RADIOLOGIST EVAL & MGMT: IMG5224

## 2023-08-28 LAB — COMPREHENSIVE METABOLIC PANEL
ALT: 74 U/L — ABNORMAL HIGH (ref 0–44)
AST: 190 U/L — ABNORMAL HIGH (ref 15–41)
Albumin: 2.4 g/dL — ABNORMAL LOW (ref 3.5–5.0)
Alkaline Phosphatase: 198 U/L — ABNORMAL HIGH (ref 38–126)
Anion gap: 19 — ABNORMAL HIGH (ref 5–15)
BUN: 52 mg/dL — ABNORMAL HIGH (ref 8–23)
CO2: 12 mmol/L — ABNORMAL LOW (ref 22–32)
Calcium: 10.8 mg/dL — ABNORMAL HIGH (ref 8.9–10.3)
Chloride: 124 mmol/L — ABNORMAL HIGH (ref 98–111)
Creatinine, Ser: 3.23 mg/dL — ABNORMAL HIGH (ref 0.44–1.00)
GFR, Estimated: 14 mL/min — ABNORMAL LOW (ref >60)
Glucose, Bld: 296 mg/dL — ABNORMAL HIGH (ref 70–99)
Potassium: 4.9 mmol/L (ref 3.5–5.1)
Sodium: 155 mmol/L — ABNORMAL HIGH (ref 135–145)
Total Bilirubin: 2.5 mg/dL — ABNORMAL HIGH (ref <1.2)
Total Protein: 6.4 g/dL — ABNORMAL LOW (ref 6.5–8.1)

## 2023-08-28 LAB — URINALYSIS, W/ REFLEX TO CULTURE (INFECTION SUSPECTED)
Bilirubin Urine: NEGATIVE
Glucose, UA: NEGATIVE mg/dL
Ketones, ur: NEGATIVE mg/dL
Nitrite: NEGATIVE
Protein, ur: 100 mg/dL — AB
Specific Gravity, Urine: 1.02 (ref 1.005–1.030)
pH: 5 (ref 5.0–8.0)

## 2023-08-28 LAB — CBG MONITORING, ED
Glucose-Capillary: 274 mg/dL — ABNORMAL HIGH (ref 70–99)
Glucose-Capillary: 42 mg/dL — CL (ref 70–99)
Glucose-Capillary: 59 mg/dL — ABNORMAL LOW (ref 70–99)
Glucose-Capillary: 63 mg/dL — ABNORMAL LOW (ref 70–99)
Glucose-Capillary: 107 mg/dL — ABNORMAL HIGH (ref 70–99)
Glucose-Capillary: 113 mg/dL — ABNORMAL HIGH (ref 70–99)

## 2023-08-28 LAB — CBC WITH DIFFERENTIAL/PLATELET
Abs Immature Granulocytes: 0.14 10*3/uL — ABNORMAL HIGH (ref 0.00–0.07)
Basophils Absolute: 0.1 10*3/uL (ref 0.0–0.1)
Basophils Relative: 0 %
Eosinophils Absolute: 0.3 10*3/uL (ref 0.0–0.5)
Eosinophils Relative: 2 %
HCT: 49.1 % — ABNORMAL HIGH (ref 36.0–46.0)
Hemoglobin: 15.3 g/dL — ABNORMAL HIGH (ref 12.0–15.0)
Immature Granulocytes: 1 %
Lymphocytes Relative: 12 %
Lymphs Abs: 1.8 10*3/uL (ref 0.7–4.0)
MCH: 32.5 pg (ref 26.0–34.0)
MCHC: 31.2 g/dL (ref 30.0–36.0)
MCV: 104.2 fL — ABNORMAL HIGH (ref 80.0–100.0)
Monocytes Absolute: 1.2 10*3/uL — ABNORMAL HIGH (ref 0.1–1.0)
Monocytes Relative: 8 %
Neutro Abs: 12.2 10*3/uL — ABNORMAL HIGH (ref 1.7–7.7)
Neutrophils Relative %: 77 %
Platelets: 156 10*3/uL (ref 150–400)
RBC: 4.71 MIL/uL (ref 3.87–5.11)
RDW: 20 % — ABNORMAL HIGH (ref 11.5–15.5)
WBC: 15.7 10*3/uL — ABNORMAL HIGH (ref 4.0–10.5)
nRBC: 0.7 % — ABNORMAL HIGH (ref 0.0–0.2)

## 2023-08-28 LAB — MRSA NEXT GEN BY PCR, NASAL: MRSA by PCR Next Gen: NOT DETECTED

## 2023-08-28 LAB — BLOOD GAS, ARTERIAL
Acid-base deficit: 11.1 mmol/L — ABNORMAL HIGH (ref 0.0–2.0)
Bicarbonate: 15.4 mmol/L — ABNORMAL LOW (ref 20.0–28.0)
Drawn by: 23430
O2 Saturation: 100 %
Patient temperature: 35.9
pCO2 arterial: 34 mm[Hg] (ref 32–48)
pH, Arterial: 7.25 — ABNORMAL LOW (ref 7.35–7.45)
pO2, Arterial: 385 mm[Hg] — ABNORMAL HIGH (ref 83–108)

## 2023-08-28 LAB — AMMONIA: Ammonia: 178 umol/L — ABNORMAL HIGH (ref 9–35)

## 2023-08-28 LAB — APTT: aPTT: 44 s — ABNORMAL HIGH (ref 24–36)

## 2023-08-28 LAB — RESP PANEL BY RT-PCR (RSV, FLU A&B, COVID)  RVPGX2
Influenza A by PCR: NEGATIVE
Influenza B by PCR: NEGATIVE
Resp Syncytial Virus by PCR: NEGATIVE
SARS Coronavirus 2 by RT PCR: NEGATIVE

## 2023-08-28 LAB — GLUCOSE, CAPILLARY: Glucose-Capillary: 70 mg/dL (ref 70–99)

## 2023-08-28 LAB — PROTIME-INR
INR: 3 — ABNORMAL HIGH (ref 0.8–1.2)
Prothrombin Time: 31.8 s — ABNORMAL HIGH (ref 11.4–15.2)

## 2023-08-28 LAB — LACTIC ACID, PLASMA
Lactic Acid, Venous: 8.7 mmol/L (ref 0.5–1.9)
Lactic Acid, Venous: 6.4 mmol/L (ref 0.5–1.9)

## 2023-08-28 MED ORDER — DEXTROSE 50 % IV SOLN
INTRAVENOUS | Status: AC | PRN
  Administered 2023-08-28: 1 via INTRAVENOUS

## 2023-08-28 MED ORDER — LACTULOSE 10 GM/15ML PO SOLN: 30.0000 g | Freq: Three times a day (TID) | ORAL | Status: DC

## 2023-08-28 MED ORDER — POLYETHYLENE GLYCOL 3350 17 G PO PACK: 17.0000 g | PACK | Freq: Every day | ORAL | Status: DC | PRN

## 2023-08-28 MED ORDER — SODIUM CHLORIDE 0.9 % IV BOLUS
2000.0000 mL | Freq: Once | INTRAVENOUS | Status: AC
  Administered 2023-08-28: 2000 mL via INTRAVENOUS

## 2023-08-28 MED ORDER — FAMOTIDINE 20 MG PO TABS: 20.0000 mg | ORAL_TABLET | Freq: Every day | ORAL | Status: DC

## 2023-08-28 MED ORDER — MIDAZOLAM HCL 5 MG/5ML IJ SOLN
2.0000 mg | Freq: Once | INTRAMUSCULAR | Status: AC
  Administered 2023-08-28: 2 mg via INTRAVENOUS

## 2023-08-28 MED ORDER — MORPHINE 100MG IN NS 100ML (1MG/ML) PREMIX INFUSION
0.0000 mg/h | INTRAVENOUS | Status: DC
  Administered 2023-08-28: 5 mg/h via INTRAVENOUS
  Administered 2023-08-29: 20 mg/h via INTRAVENOUS
  Filled 2023-08-28 (×4): qty 100

## 2023-08-28 MED ORDER — POLYVINYL ALCOHOL 1.4 % OP SOLN: 1.0000 [drp] | Freq: Four times a day (QID) | OPHTHALMIC | Status: DC | PRN

## 2023-08-28 MED ORDER — GLYCOPYRROLATE 1 MG PO TABS: 1.0000 mg | ORAL_TABLET | ORAL | Status: DC | PRN

## 2023-08-28 MED ORDER — ORAL CARE MOUTH RINSE: 15.0000 mL | OROMUCOSAL | Status: DC

## 2023-08-28 MED ORDER — GLYCOPYRROLATE 0.2 MG/ML IJ SOLN: 0.2000 mg | INTRAMUSCULAR | Status: DC | PRN

## 2023-08-28 MED ORDER — MORPHINE BOLUS VIA INFUSION
5.0000 mg | INTRAVENOUS | Status: DC | PRN
  Administered 2023-08-28 (×3): 5 mg via INTRAVENOUS

## 2023-08-28 MED ORDER — CHLORHEXIDINE GLUCONATE CLOTH 2 % EX PADS
6.0000 | MEDICATED_PAD | Freq: Every day | CUTANEOUS | Status: DC
  Administered 2023-08-28: 6 via TOPICAL

## 2023-08-28 MED ORDER — MIDAZOLAM HCL 2 MG/2ML IJ SOLN: INTRAMUSCULAR | Status: AC | PRN

## 2023-08-28 MED ORDER — ACETAMINOPHEN 325 MG PO TABS: 650.0000 mg | ORAL_TABLET | Freq: Four times a day (QID) | ORAL | Status: DC | PRN

## 2023-08-28 MED ORDER — PROPOFOL 1000 MG/100ML IV EMUL
5.0000 ug/kg/min | INTRAVENOUS | Status: DC
  Administered 2023-08-28: 5 ug/kg/min via INTRAVENOUS
  Filled 2023-08-28: qty 100

## 2023-08-28 MED ORDER — SODIUM CHLORIDE 0.9 % IV BOLUS
1000.0000 mL | Freq: Once | INTRAVENOUS | Status: AC
  Administered 2023-08-28: 1000 mL via INTRAVENOUS

## 2023-08-28 MED ORDER — MIDAZOLAM HCL (PF) 10 MG/2ML IJ SOLN
INTRAMUSCULAR | Status: AC
  Filled 2023-08-28: qty 2

## 2023-08-28 MED ORDER — ACETAMINOPHEN 650 MG RE SUPP: 650.0000 mg | Freq: Four times a day (QID) | RECTAL | Status: DC | PRN

## 2023-08-28 MED ORDER — POLYETHYLENE GLYCOL 3350 17 G PO PACK: 17.0000 g | PACK | Freq: Every day | ORAL | Status: DC

## 2023-08-28 MED ORDER — ORAL CARE MOUTH RINSE: 15.0000 mL | OROMUCOSAL | Status: DC | PRN

## 2023-08-28 MED ORDER — DOCUSATE SODIUM 100 MG PO CAPS: 100.0000 mg | ORAL_CAPSULE | Freq: Two times a day (BID) | ORAL | Status: DC

## 2023-08-28 MED ORDER — SODIUM CHLORIDE 0.9 % IV SOLN
2.0000 g | Freq: Once | INTRAVENOUS | Status: AC
  Administered 2023-08-28: 2 g via INTRAVENOUS
  Filled 2023-08-28: qty 20

## 2023-08-28 MED ORDER — DOCUSATE SODIUM 100 MG PO CAPS: 100.0000 mg | ORAL_CAPSULE | Freq: Two times a day (BID) | ORAL | Status: DC | PRN

## 2023-08-28 MED ORDER — CEFTRIAXONE SODIUM IN DEXTROSE 40 MG/ML IV SOLN: 2.0000 g | INTRAVENOUS | Status: DC

## 2023-08-28 NOTE — Progress Notes (Signed)
RT transported patient to CT from ER and back with no complications noted. RT will continue to monitor patient.

## 2023-08-28 NOTE — Progress Notes (Signed)
eLink Physician-Brief Progress Note Patient Name: [name] DOB: December 06, 1940 MRN: [id number]   Date of Service  08/28/2023  HPI/Events of Note  34M with stage II hepatocelluar carcinoma with possible progression of mets, NASH cirrhosis c/b varices s/p banding, hx multiple CVAs on [facility] including recent stroke in 07/2023, atrial fibrillation, CKD IIIa, HTN who p/w weakness and confusion. In the [facility] ED intubated for airway protection. CT head concerning for acute /subacute infarct with petechial hemorrhage and cytotoxic edema.  Labs significant for Na 155, CO2 12, AST 190, AP 198, ammonia 178, LA 8.7>6.4. WBC 15.7. INR 3. Glucose 63> improved to 107.  Given IVF 3L and ceftriaxone. Transferred to [facility] on vent. On arrival, minimal responsive with grimace to pain. Repeat glucose 70. No pressor needs. On propofol for sedation.  eICU Interventions  Continue vent support GOC with family Hold on antihypertensive agents        [name] Mechele Collin 08/28/2023, 7:29 PM

## 2023-08-28 NOTE — ED Notes (Signed)
Date and time results received: 08/28/23 3:10 PM  (use smartphrase ".now" to insert current time)  Test: Lactic acid Critical Value: 8.7  Name of Provider Notified: Dr. Estell Harpin  Orders Received? Or Actions Taken?:

## 2023-08-28 NOTE — Sepsis Progress Note (Signed)
Notified provider of need to order repeat lactic acid. ° °

## 2023-08-28 NOTE — Sepsis Progress Note (Signed)
Notified paramedic and  newly assigned bedside nurse of need to administer antibiotics and fluid bolus.

## 2023-08-28 NOTE — ED Triage Notes (Signed)
Grand daughter stated pt had stroke in mid Nov and has been off since  Weak since Saturday Was able to stand yesterday with max assist Family lifting pt today Went to PCP, unable to get BP and pt was very sleepy  PCP told to go to ED  Granddaughter stated vitals at home was 64 HR and BP 100/60  Noted that pt was not breathing when brought to triage Called Charge RN Pt lifted from wheelchair to stretcher HR 93 on zoll.  Pt unresponsive. MD at bedside

## 2023-08-28 NOTE — ED Provider Notes (Signed)
[facility] AT [facility] Provider Note   CSN: [id number] Arrival date & time: 08/28/23  1328     History  Chief Complaint  Patient presents with   Cardiac Arrest    [name] is a 82 y.o. female.  Patient has a history of cirrhosis and liver cancer.  Patient became weak and confused on Saturday and became obtunded today.  She presented herself at her doctor's office who sent her to the emergency department.  The history is provided by the EMS personnel and a relative. No language interpreter was used.  Altered Mental Status Presenting symptoms: behavior changes   Severity:  Severe Most recent episode:  Yesterday Episode history:  Continuous Timing:  Constant Progression:  Worsening Chronicity:  New Context: not dementia   Associated symptoms: no abdominal pain and normal movement        Home Medications Prior to Admission medications   Medication Sig Start Date End Date Taking? Authorizing Provider  acetaminophen (TYLENOL) 500 MG tablet Take 1,000 mg by mouth at bedtime.    [provider]  aspirin EC 81 MG tablet Take 1 tablet (81 mg total) by mouth daily. Swallow whole. 09/26/22   Suttle, Thressa Sheller, MD  atorvastatin (LIPITOR) 80 MG tablet Take 1 tablet (80 mg total) by mouth at bedtime. 08/19/23   Sharee Holster, NP  Calcium Carbonate 500 (200 Ca) MG WAFR Take 1 tablet by mouth in the morning and at bedtime.    [provider]  CALCIUM CITRATE PO Take 200 mg by mouth 2 (two) times daily.    [provider]  carvedilol (COREG) 3.125 MG tablet Take 1 tablet (3.125 mg total) by mouth 2 (two) times daily with a meal. 08/19/23   Sharee Holster, NP  Cholecalciferol (VITAMIN D3) 50 MCG (2000 UT) TABS Take 2,000 Units by mouth daily.    [provider]  Coenzyme Q10 (CO Q-10) 100 MG CAPS Take 100 mg by mouth in the morning.    [provider]  dabigatran (PRADAXA) 150 MG CAPS capsule Take 1  capsule (150 mg total) by mouth every 12 (twelve) hours. 08/19/23   Sharee Holster, NP  diltiazem (CARDIZEM CD) 240 MG 24 hr capsule Take 1 capsule (240 mg total) by mouth daily. 08/19/23   Sharee Holster, NP  doxepin (SINEQUAN) 25 MG capsule Take 1 capsule (25 mg total) by mouth at bedtime. 08/19/23   Sharee Holster, NP  furosemide (LASIX) 40 MG tablet Take 1 tablet (40 mg total) by mouth daily. 08/19/23   Sharee Holster, NP  lactulose (CHRONULAC) 10 GM/15ML solution Take 60 mLs (40 g total) by mouth 3 (three) times daily. 08/19/23   Sharee Holster, NP  Multiple Vitamin (MULTIVITAMIN WITH MINERALS) TABS tablet Take 1 tablet by mouth daily.    [provider]  Multiple Vitamins-Minerals (SENTRY SENIOR/LUTEIN) TABS Take by mouth. 500-300-250 mcg; amt: 1 tab; oral Once A Day; 09:00 AM [DX: Type 2 diabetes mellitus with other circulatory complications]    [provider]  Nutritional Supplements (PROSOURCE) LIQD Take 30 mLs by mouth 3 (three) times daily. 08/19/23   Sharee Holster, NP  omeprazole (PRILOSEC) 40 MG capsule Take 1 capsule (40 mg total) by mouth daily. 08/19/23   Sharee Holster, NP  potassium chloride SA (KLOR-CON M) 20 MEQ tablet Take 1 tablet (20 mEq total) by mouth 3 (three) times daily. 08/19/23   Sharee Holster, NP  Allergies    Tape    Review of Systems   Review of Systems  Unable to perform ROS: Mental status change  Gastrointestinal:  Negative for abdominal pain.    Physical Exam Updated Vital Signs BP 132/60 (BP Location: Right Arm)   Pulse 84   Temp (!) 96.9 F (36.1 C) (Core)   Resp (!) 22   Ht 4\' 11"  (1.499 m)   Wt 53.5 kg   SpO2 100%   BMI 23.83 kg/m  Physical Exam Vitals and nursing note reviewed.  Constitutional:      Appearance: She is well-developed.     Comments: Lethargic  HENT:     Head: Normocephalic.     Nose: Nose normal.  Eyes:     General: No scleral icterus.    Conjunctiva/sclera: Conjunctivae  normal.  Neck:     Thyroid: No thyromegaly.  Cardiovascular:     Rate and Rhythm: Normal rate and regular rhythm.     Heart sounds: No murmur heard.    No friction rub. No gallop.  Pulmonary:     Breath sounds: No stridor. No wheezing or rales.     Comments: Patient with poor respiratory effort and not protecting her airway Chest:     Chest wall: No tenderness.  Abdominal:     General: There is no distension.     Tenderness: There is no abdominal tenderness. There is no rebound.  Musculoskeletal:        General: Normal range of motion.     Cervical back: Neck supple.  Lymphadenopathy:     Cervical: No cervical adenopathy.  Skin:    Findings: No erythema or rash.  Neurological:     Motor: No abnormal muscle tone.     Coordination: Coordination normal.     Comments: Patient only responding to painful stimuli     ED Results / Procedures / Treatments   Labs (all labs ordered are listed, but only abnormal results are displayed) Labs Reviewed  LACTIC ACID, PLASMA - Abnormal; Notable for the following components:      Result Value   Lactic Acid, Venous 8.7 (*)    All other components within normal limits  COMPREHENSIVE METABOLIC PANEL - Abnormal; Notable for the following components:   Sodium 155 (*)    Chloride 124 (*)    CO2 12 (*)    Glucose, Bld 296 (*)    BUN 52 (*)    Creatinine, Ser 3.23 (*)    Calcium 10.8 (*)    Total Protein 6.4 (*)    Albumin 2.4 (*)    AST 190 (*)    ALT 74 (*)    Alkaline Phosphatase 198 (*)    Total Bilirubin 2.5 (*)    GFR, Estimated 14 (*)    Anion gap 19 (*)    All other components within normal limits  CBC WITH DIFFERENTIAL/PLATELET - Abnormal; Notable for the following components:   WBC 15.7 (*)    Hemoglobin 15.3 (*)    HCT 49.1 (*)    MCV 104.2 (*)    RDW 20.0 (*)    nRBC 0.7 (*)    Neutro Abs 12.2 (*)    Monocytes Absolute 1.2 (*)    Abs Immature Granulocytes 0.14 (*)    All other components within normal limits   PROTIME-INR - Abnormal; Notable for the following components:   Prothrombin Time 31.8 (*)    INR 3.0 (*)    All other components within normal limits  APTT - Abnormal; Notable for the following components:   aPTT 44 (*)    All other components within normal limits  URINALYSIS, W/ REFLEX TO CULTURE (INFECTION SUSPECTED) - Abnormal; Notable for the following components:   Color, Urine AMBER (*)    APPearance HAZY (*)    Hgb urine dipstick LARGE (*)    Protein, ur 100 (*)    Leukocytes,Ua TRACE (*)    Bacteria, UA RARE (*)    All other components within normal limits  AMMONIA - Abnormal; Notable for the following components:   Ammonia 178 (*)    All other components within normal limits  CBG MONITORING, ED - Abnormal; Notable for the following components:   Glucose-Capillary 59 (*)    All other components within normal limits  CBG MONITORING, ED - Abnormal; Notable for the following components:   Glucose-Capillary 274 (*)    All other components within normal limits  CBG MONITORING, ED - Abnormal; Notable for the following components:   Glucose-Capillary 42 (*)    All other components within normal limits  RESP PANEL BY RT-PCR (RSV, FLU A&B, COVID)  RVPGX2  CULTURE, BLOOD (ROUTINE X 2)  URINE CULTURE  BLOOD GAS, VENOUS  BLOOD GAS, ARTERIAL    EKG None  Radiology DG Chest Port 1 View Result Date: 08/28/2023 CLINICAL DATA:  Cardiac arrest. EXAM: PORTABLE CHEST 1 VIEW COMPARISON:  07/31/2023 FINDINGS: Endotracheal tube tip is 3.9 cm above the base of the carina. NG tube tip is in the proximal stomach with side port of the NG tube in the distal esophagus. Lungs are mildly hyperexpanded but clear. No evidence for pneumothorax or pleural effusion. The cardiopericardial silhouette is within normal limits for size. Nodular density at the right base may be a nipple shadow. Telemetry leads overlie the chest. IMPRESSION: 1. Endotracheal tube tip is 3.9 cm above the base of the carina. 2.  NG tube tip is in the proximal stomach with side port of the NG tube in the distal esophagus. This tube could be advanced about 6 cm to place the side port below the GE junction. 3. No acute cardiopulmonary findings. 4. Nodular density at the right base may be a nipple shadow. Follow-up non emergent chest radiograph with nipple markers recommended to confirm. Electronically Signed   By: Kennith Center M.D.   On: 08/28/2023 15:50   CT Head Wo Contrast Result Date: 08/28/2023 CLINICAL DATA:  Cardiac arrest, unresponsive EXAM: CT HEAD WITHOUT CONTRAST TECHNIQUE: Contiguous axial images were obtained from the base of the skull through the vertex without intravenous contrast. RADIATION DOSE REDUCTION: This exam was performed according to the departmental dose-optimization program which includes automated exposure control, adjustment of the mA and/or kV according to patient size and/or use of iterative reconstruction technique. COMPARISON:  12/05/2021 CT head FINDINGS: Brain: Hyperdensity in the left frontal operculum (series 3, image 18), with adjacent hypodensity, concerning for acute to subacute infarct with petechial hemorrhage and cytotoxic edema. No evidence of mass, mass effect, or midline shift. No hydrocephalus or extra-axial fluid collection. Redemonstrated sequela of prior infarcts in the right MCA territory and left cerebellum. Vascular: No hyperdense vessel. Skull: Negative for fracture or focal lesion. Sinuses/Orbits: No acute finding. Status post bilateral lens replacements. Other: Fluid in right mastoid air cells. IMPRESSION: Hyperdensity in the left frontal operculum, with adjacent hypodensity, concerning for acute to subacute infarct with petechial hemorrhage and cytotoxic edema. MRI is recommended for further evaluation. These findings were discussed by telephone on 08/28/2023 at  3:43 pm with provider Ulas Zuercher . Electronically Signed   By: Wiliam Ke M.D.   On: 08/28/2023 15:44   IR  Radiologist Eval & Mgmt Result Date: 08/28/2023 EXAM: ESTABLISHED PATIENT OFFICE VISIT CHIEF COMPLAINT: See Epic note. HISTORY OF PRESENT ILLNESS: See Epic note. REVIEW OF SYSTEMS: See Epic note. PHYSICAL EXAMINATION: See Epic note. ASSESSMENT AND PLAN: See Epic note. Marliss Coots, MD [facility] Radiology Electronically Signed   By: Marliss Coots M.D.   On: 08/28/2023 11:36    Procedures Procedures    Medications Ordered in ED Medications  sodium chloride 0.9 % bolus 1,000 mL (has no administration in time range)  sodium chloride 0.9 % bolus 2,000 mL (2,000 mLs Intravenous New Bag/Given 08/28/23 1445)  cefTRIAXone (ROCEPHIN) 2 g in sodium chloride 0.9 % 100 mL IVPB (0 g Intravenous Stopped 08/28/23 1539)  midazolam PF (VERSED) 10 MG/2ML injection (  Given 08/28/23 1438)  dextrose 50 % solution (1 ampule Intravenous Given 08/28/23 1341)  midazolam (VERSED) injection (2 mg Intravenous Not Given 08/28/23 1441)  midazolam (VERSED) 5 MG/5ML injection 2 mg (2 mg Intravenous Given 08/28/23 1401)    ED Course/ Medical Decision Making/ A&P  Patient was not protecting her airway when she first arrived.  Patient need to be intubated.  Sepsis protocol was initiated.  Ammonia level came back at 178 along with sodium 155.  I spoke with critical care Dr. Denese Killings and he agreed to accept the patient over at [facility] for hepatic encephalopathy.  CT scan of the head suggested possible stroke.  I spoke with neurology and they recommended an MRI                               Medical Decision Making Amount and/or Complexity of Data Reviewed Labs: ordered. Radiology: ordered. ECG/medicine tests: ordered.  Risk Prescription drug management. Decision regarding hospitalization.   Patient with altered mental status secondary to hepatic encephalopathy and possible subacute stroke with severe dehydration and AKI.  Patient is being transferred to [facility],  ICU        Final Clinical Impression(s) / ED Diagnoses Final diagnoses:  Hepatic encephalopathy [facility])    Rx / DC Orders ED Discharge Orders     None         Bethann Berkshire, MD 08/28/23 1751

## 2023-08-28 NOTE — H&P (Addendum)
NAME:  [name], MRN:  [id number], DOB:  [birth year], LOS: 0 ADMISSION DATE:  08/28/2023, CONSULTATION DATE:  12/20 REFERRING MD:  zammit, CHIEF COMPLAINT:  cardiac arrest    History of Present Illness:  82 year old female w/ extensive medical hx as outlined below. Most significantly NASH Cirrhosis, HCC w/ concern for disease progression,  and poor performance score. Recently referred to Palliative by oncology.   Presented to ER at [facility] w/ decreased LOC x 24 hrs (sent by PCP). In ER unresponsive, CBG 42, lactic acid 8.7 cleared to 6.4 w/ volume resuscitation, , only responsive to pain, ammonia level 178, Na 155, cl 124, CO2 12, ASt 190, alt 74, alk po4 198, T BILI 2.5, Anion gap 19, CBC 15.7, hgb 15.3, crt 49.1, INR 3, CKD stage IIIa, CT head hyperdensity left frontal operculum raising concern for acute/sub acute infarct w/ petechial hemorrhage and cytotoxic edema. Neurology recommended MRI.   She was intubated by EDP. Cultures sent, received volume resuscitation (3 liters saline) Administered CTX for abx and concern for infection, her hypoglycemia was treated, and sent to [facility].   Pertinent  Medical History  Elita Boone, Bx proven Hepatocellular carcinoma (unifocal) w/ recent imaging c/w metastatic disease, and poor performance scorem Cirrhosis, varices, esophageal, anemia, recent referral to palliative care, celiac artery stent, and then s/p radioembolization of segment of [facility] 2/24 (last imaging showing non-viable mass), recent ischemic stroke (And several prior strokes over last 10 yrs), esophageal varices, Nov 24 right thoracentesis,  CAF, anxiety, Bilateral carotid stenosis, CAD s/p prior stents, DM, GERD, hiatal hernia, HL, OSA not on CPAP, PSVt,   Significant Hospital Events: Including procedures, antibiotic start and stop dates in addition to other pertinent events   12/20 admitted w/ AMS, HE, Acute on chronic renal failure, dehydration, prob sepsis and new stroke. Intubated   Interim  History / Subjective:  Minimally responsive   Objective   Blood pressure (!) 117/59, pulse 84, temperature (!) 96.5 F (35.8 C), temperature source Bladder, resp. rate (!) 22, height 4\' 11"  (1.499 m), weight 53.5 kg, SpO2 99%.    Vent Mode: PRVC FiO2 (%):  [100 %] 100 % Set Rate:  [15 bmp] 15 bmp Vt Set:  [350 mL] 350 mL PEEP:  [5 cmH20] 5 cmH20 Plateau Pressure:  [12 cmH20] 12 cmH20   Intake/Output Summary (Last 24 hours) at 08/28/2023 1900 Last data filed at 08/28/2023 1722 Gross per 24 hour  Intake 3115.65 ml  Output --  Net 3115.65 ml   Filed Weights   08/28/23 1340  Weight: 53.5 kg    Examination: General: critically Ill 82 year old female minimally responsive on vent  HENT: NCAT has temporal wasting  Lungs: clear RR 30s  Cardiovascular: Reg irreg af on tele no MRG Abdomen: soft  Extremities: mottled warm pulses palp  Neuro: grimaces to pain localizes w/ LUE, flicker response on right. Weak  GU: cl yellow   Resolved Hospital Problem list     Assessment & Plan:  Acute metabolic and hepatic encephalopathy w/ marked hyperammonemia complicated further by acute L frontal CVA w/ concern for hemorrhagic conversion and cerebral edema superimposed on h/o several strokes in past -she is minimally responsive> ammonia was 178 Plan Admit to ICU  We have placed MRI on hold. Will not change outcome Supportive care  Awaiting family arrival   Ventilator management: intubated for airway protection Does appear as though she may have some RLL metastasis  Plan Cont vent support for now VAP bundle  PAD protocol  RASS goal -1  Leukocytosis r/o sepsis  -uti looked clear. Abd unremarkable  -got dose of rocephin Plan Awaiting family arrival  Recommending comfort care  Severe lactic acidosis 2/2 end-organ hypoperfusion  Now s/p volume resuscitation and hemodynamically stable  Plan Cont supportive care  Acute on chronic renal failure (CKD stage IIIA) Plan Foley  placed Recommending comfort Not candidate for HD  Severe fluid and electrolyte imbalance: Hypernatremia, hyperchloremia, and volume depletion  Plan S/p 3 liters NaCl Holding off on further labs   H/o NASH cirrhosis w/ varices and also HCC w/ recent evidence of disease progression and elevated LFTs, and elevated t bili  Plan Supportive care   Chronic afib, h/o CAD and remote stents Plan Tele   Hypoglycemia  Plan Supportive care  Pressure ulcer (POA) on sacrum stage I  Plan Supportive care   DNR status  This is not a survivable insult Family notified Plan  Supportive care Recommending comfort when family arrives   Sacral pressure ulcer Plan Supportive care  Best Practice (right click and "Reselect all SmartList Selections" daily)   Diet/type: NPO DVT prophylaxis SCD Pressure ulcer(s): present on admission , identified on: 12/Dec/20, and stage I on sacrum, treatment: local wound cares GI prophylaxis: H2B Lines: N/A Foley:  Yes, and it is still needed Code Status:  DNR Last date of multidisciplinary goals of care discussion [discussed w/ granddaughter told family I will not offer CPR and that this is not survivable. Have recommended that they come in and we will recommend comfort care at that point ]  Labs   CBC: Recent Labs  Lab 08/28/23 1412  WBC 15.7*  NEUTROABS 12.2*  HGB 15.3*  HCT 49.1*  MCV 104.2*  PLT 156    Basic Metabolic Panel: Recent Labs  Lab 08/28/23 1412  NA 155*  K 4.9  CL 124*  CO2 12*  GLUCOSE 296*  BUN 52*  CREATININE 3.23*  CALCIUM 10.8*   GFR: Estimated Creatinine Clearance: 10.2 mL/min (A) (by C-G formula based on SCr of 3.23 mg/dL (H)). Recent Labs  Lab 08/28/23 1412 08/28/23 1702  WBC 15.7*  --   LATICACIDVEN 8.7* 6.4*    Liver Function Tests: Recent Labs  Lab 08/28/23 1412  AST 190*  ALT 74*  ALKPHOS 198*  BILITOT 2.5*  PROT 6.4*  ALBUMIN 2.4*   No results for input(s): "LIPASE", "AMYLASE" in the last  168 hours. Recent Labs  Lab 08/28/23 1414  AMMONIA 178*    ABG    Component Value Date/Time   PHART 7.25 (L) 08/28/2023 1600   PCO2ART 34 08/28/2023 1600   PO2ART 385 (H) 08/28/2023 1600   HCO3 15.4 (L) 08/28/2023 1600   TCO2 29 12/05/2021 2242   ACIDBASEDEF 11.1 (H) 08/28/2023 1600   O2SAT 100 08/28/2023 1600     Coagulation Profile: Recent Labs  Lab 08/28/23 1412  INR 3.0*    Cardiac Enzymes: No results for input(s): "CKTOTAL", "CKMB", "CKMBINDEX", "TROPONINI" in the last 168 hours.  HbA1C: Hgb A1c MFr Bld  Date/Time Value Ref Range Status  07/30/2023 02:50 PM 5.3 4.8 - 5.6 % Final    Comment:    (NOTE) Pre diabetes:          5.7%-6.4%  Diabetes:              >6.4%  Glycemic control for   <7.0% adults with diabetes   01/03/2020 01:04 PM 5.2 4.8 - 5.6 % Final    Comment:    (  NOTE) Pre diabetes:          5.7%-6.4% Diabetes:              >6.4% Glycemic control for   <7.0% adults with diabetes     CBG: Recent Labs  Lab 08/28/23 1350 08/28/23 1410 08/28/23 1726 08/28/23 1817 08/28/23 1821  GLUCAP 59* 274* 63* 107* 113*    Review of Systems:   Not able   Past Medical History:  She,  has a past medical history of Anemia, Anxiety, Aortic insufficiency, Atrial fibrillation (HCC), Back pain, Carotid stenosis, bilateral, Cirrhosis (HCC), Closed left femoral fracture (HCC) (04/21/2017), Coronary artery disease, DDD (degenerative disc disease), lumbar, Depression, Diabetes mellitus without complication (HCC), Essential hypertension, Falls, GERD (gastroesophageal reflux disease), Hiatal hernia, History of stroke, Hypercholesteremia, IBS (irritable bowel syndrome), Non-alcoholic fatty liver disease, OSA (obstructive sleep apnea), Pericardial effusion, PSVT (paroxysmal supraventricular tachycardia) (HCC), Recurrent UTI, Seizures (HCC) (2018), Stroke (HCC) (07/2019), Swallowing difficulty, and Varices, esophageal (HCC).   Surgical History:   Past Surgical  History:  Procedure Laterality Date   ANAL FISSURE REPAIR  1972   APPENDECTOMY     BACK SURGERY  1985   CARDIAC CATHETERIZATION  [phone]   Stent to the proximal RCA after MI    CARDIAC CATHETERIZATION N/A 09/26/2016   Procedure: Left Heart Cath and Coronary Angiography;  Surgeon: Marykay Lex, MD;  [facility] INVASIVE CV LAB;  Service: Cardiovascular;  Laterality: N/A;   CATARACT EXTRACTION W/PHACO Right 07/04/2014   Procedure: CATARACT EXTRACTION PHACO AND INTRAOCULAR LENS PLACEMENT (IOC);  Surgeon: Loraine Leriche T. Nile Riggs, MD;  Location: [facility];  Service: Ophthalmology;  Laterality: Right;  CDE:13.13   COLONOSCOPY     DILATION AND CURETTAGE OF UTERUS     x2   Epi Retinal Membrane Peel Left    ERCP     ESOPHAGEAL BANDING N/A 04/01/2013   Procedure: ESOPHAGEAL BANDING;  Surgeon: Malissa Hippo, MD;  Location: [facility];  Service: Endoscopy;  Laterality: N/A;   ESOPHAGEAL BANDING N/A 05/24/2013   Procedure: ESOPHAGEAL BANDING;  Surgeon: Malissa Hippo, MD;  Location: [facility];  Service: Endoscopy;  Laterality: N/A;   ESOPHAGEAL BANDING N/A 06/21/2014   Procedure: ESOPHAGEAL BANDING;  Surgeon: Malissa Hippo, MD;  Location: [facility];  Service: Endoscopy;  Laterality: N/A;   ESOPHAGEAL BANDING  06/15/2020   Procedure: ESOPHAGEAL BANDING;  Surgeon: Dolores Frame, MD;  Location: [facility];  Service: Gastroenterology;;   ESOPHAGOGASTRODUODENOSCOPY N/A 04/01/2013   Procedure: ESOPHAGOGASTRODUODENOSCOPY (EGD);  Surgeon: Malissa Hippo, MD;  Location: [facility];  Service: Endoscopy;  Laterality: N/A;  230-rescheduled to 8:30am Ann notified pt   ESOPHAGOGASTRODUODENOSCOPY N/A 05/24/2013   Procedure: ESOPHAGOGASTRODUODENOSCOPY (EGD);  Surgeon: Malissa Hippo, MD;  Location: [facility];  Service: Endoscopy;  Laterality: N/A;  730   ESOPHAGOGASTRODUODENOSCOPY N/A 06/21/2014   Procedure: ESOPHAGOGASTRODUODENOSCOPY (EGD);  Surgeon: Malissa Hippo, MD;   [facility]: [facility];  Service: Endoscopy;  Laterality: N/A;  930-rescheduled 10/14 @ 1200 Ann to notify pt   ESOPHAGOGASTRODUODENOSCOPY (EGD) WITH PROPOFOL N/A 06/15/2020   Procedure: ESOPHAGOGASTRODUODENOSCOPY (EGD) WITH PROPOFOL;  Surgeon: Dolores Frame, MD;  [facility]: [facility];  Service: Gastroenterology;  Laterality: N/A;  230   ESOPHAGOGASTRODUODENOSCOPY (EGD) WITH PROPOFOL N/A 07/27/2020   Procedure: ESOPHAGOGASTRODUODENOSCOPY (EGD) WITH PROPOFOL;  Surgeon: Dolores Frame, MD;  [facility]: [facility];  Service: Gastroenterology;  Laterality: N/A;  1245   ESOPHAGOGASTRODUODENOSCOPY (EGD) WITH PROPOFOL N/A 08/19/2022  Procedure: ESOPHAGOGASTRODUODENOSCOPY (EGD) WITH PROPOFOL;  Surgeon: Dolores Frame, MD;  Location: [facility];  Service: Gastroenterology;  Laterality: N/A;  130 ASA 3, pt knows to arrive at 6:15   EYE SURGERY  08   cataract surgery of the left eye   FEMUR IM NAIL Left 03/02/2017   Procedure: INTRAMEDULLARY (IM) NAIL FEMORAL;  Surgeon: Samson Frederic, MD;  Location: [facility];  Service: Orthopedics;  Laterality: Left;   HARDWARE REMOVAL Right 01/17/2013   Procedure: REMOVAL OF HARDWARE AND EXCISION ULNAR STYLOID RIGHT WRIST;  Surgeon: Tami Ribas, MD;  Location: [facility];  Service: Orthopedics;  Laterality: Right;   implanted heart monitor  02/2020   to monitor Afib   IR 3D INDEPENDENT WKST  09/26/2022   IR ANGIOGRAM SELECTIVE EACH ADDITIONAL VESSEL  09/26/2022   IR ANGIOGRAM SELECTIVE EACH ADDITIONAL VESSEL  09/26/2022   IR ANGIOGRAM SELECTIVE EACH ADDITIONAL VESSEL  09/26/2022   IR ANGIOGRAM SELECTIVE EACH ADDITIONAL VESSEL  09/26/2022   IR ANGIOGRAM SELECTIVE EACH ADDITIONAL VESSEL  09/26/2022   IR ANGIOGRAM SELECTIVE EACH ADDITIONAL VESSEL  10/10/2022   IR ANGIOGRAM SELECTIVE EACH ADDITIONAL VESSEL  10/10/2022   IR ANGIOGRAM SELECTIVE EACH ADDITIONAL VESSEL  10/10/2022   IR ANGIOGRAM VISCERAL SELECTIVE  09/26/2022   IR  ANGIOGRAM VISCERAL SELECTIVE  10/10/2022   IR EMBO TUMOR ORGAN ISCHEMIA INFARCT INC GUIDE ROADMAPPING  10/10/2022   IR INTRAVASCULAR ULTRASOUND NON CORONARY  09/26/2022   IR RADIOLOGIST EVAL & MGMT  09/10/2022   IR RADIOLOGIST EVAL & MGMT  11/03/2022   IR RADIOLOGIST EVAL & MGMT  02/10/2023   IR RADIOLOGIST EVAL & MGMT  05/15/2023   IR RADIOLOGIST EVAL & MGMT  08/28/2023   IR TRANSCATH PLC STENT 1ST ART NOT LE CV CAR VERT CAR  09/26/2022   IR US GUIDE VASC ACCESS RIGHT  09/26/2022   IR US GUIDE VASC ACCESS RIGHT  10/10/2022   MYRINGOTOMY  2012   both ears   ORIF FEMUR FRACTURE Left 04/23/2017   Procedure: OPEN REDUCTION INTERNAL FIXATION (ORIF) DISTAL FEMUR FRACTURE (FRACTURE AROUND FEMORAL NAIL);  Surgeon: Myrene Galas, MD;  Location[facility] OR;  Service: Orthopedics;  Laterality: Left;   TONSILLECTOMY     TUBAL LIGATION     VAGINAL HYSTERECTOMY  1972   WRIST SURGERY     rt wrist hardwear removal     Social History:   reports that she quit smoking about 13 months ago. Her smoking use included cigarettes. She started smoking about 51 years ago. She has a 12.5 pack-year smoking history. She has been exposed to tobacco smoke. She has never used smokeless tobacco. She reports that she does not drink alcohol and does not use drugs.   Family History:  Her family history includes Diabetes in her mother; Heart failure in her father and maternal aunt.   Allergies Allergies  Allergen Reactions   Tape Rash and Other (See Comments)    Old paper tape caused a reaction, doesn't react to most other tapes      Home Medications  Prior to Admission medications   Medication Sig Start Date End Date Taking? Authorizing Provider  acetaminophen (TYLENOL) 500 MG tablet Take 1,000 mg by mouth at bedtime.    [provider]  aspirin EC 81 MG tablet Take 1 tablet (81 mg total) by mouth daily. Swallow whole. 09/26/22   Suttle, Thressa Sheller, MD  atorvastatin (LIPITOR) 80 MG tablet Take 1 tablet (80 mg total) by mouth at  bedtime. 08/19/23   Sharee Holster, NP  Calcium Carbonate 500 (200 Ca) MG WAFR Take 1 tablet by mouth in the morning and at bedtime.    [provider]  CALCIUM CITRATE PO Take 200 mg by mouth 2 (two) times daily.    [provider]  carvedilol (COREG) 3.125 MG tablet Take 1 tablet (3.125 mg total) by mouth 2 (two) times daily with a meal. 08/19/23   Sharee Holster, NP  Cholecalciferol (VITAMIN D3) 50 MCG (2000 UT) TABS Take 2,000 Units by mouth daily.    [provider]  Coenzyme Q10 (CO Q-10) 100 MG CAPS Take 100 mg by mouth in the morning.    [provider]  dabigatran (PRADAXA) 150 MG CAPS capsule Take 1 capsule (150 mg total) by mouth every 12 (twelve) hours. 08/19/23   Sharee Holster, NP  diltiazem (CARDIZEM CD) 240 MG 24 hr capsule Take 1 capsule (240 mg total) by mouth daily. 08/19/23   Sharee Holster, NP  doxepin (SINEQUAN) 25 MG capsule Take 1 capsule (25 mg total) by mouth at bedtime. 08/19/23   Sharee Holster, NP  furosemide (LASIX) 40 MG tablet Take 1 tablet (40 mg total) by mouth daily. 08/19/23   Sharee Holster, NP  lactulose (CHRONULAC) 10 GM/15ML solution Take 60 mLs (40 g total) by mouth 3 (three) times daily. 08/19/23   Sharee Holster, NP  Multiple Vitamin (MULTIVITAMIN WITH MINERALS) TABS tablet Take 1 tablet by mouth daily.    [provider]  Multiple Vitamins-Minerals (SENTRY SENIOR/LUTEIN) TABS Take by mouth. 500-300-250 mcg; amt: 1 tab; oral Once A Day; 09:00 AM [DX: Type 2 diabetes mellitus with other circulatory complications]    [provider]  Nutritional Supplements (PROSOURCE) LIQD Take 30 mLs by mouth 3 (three) times daily. 08/19/23   Sharee Holster, NP  omeprazole (PRILOSEC) 40 MG capsule Take 1 capsule (40 mg total) by mouth daily. 08/19/23   Sharee Holster, NP  potassium chloride SA (KLOR-CON M) 20 MEQ tablet Take 1 tablet (20 mEq total) by mouth 3 (three) times daily. 08/19/23   Sharee Holster, NP     Critical care time: 35 min       Simonne Martinet ACNP-BC [facility] Pager # [phone] OR # [phone] if no answer

## 2023-08-28 NOTE — ED Notes (Addendum)
Pt does not have a DNR or living will

## 2023-08-28 NOTE — Progress Notes (Signed)
Pt extubated to 2L [place] per MD order.

## 2023-08-28 NOTE — ED Notes (Signed)
CBG 42 in triage.

## 2023-08-28 NOTE — IPAL (Signed)
  Interdisciplinary Goals of Care Family Meeting   Date carried out: 08/28/2023  Location of the meeting: Bedside  Member's involved: Nurse Practitioner, Bedside Registered Nurse, and Family Member or next of kin  Durable Power of Attorney or acting medical decision maker: Son  Molly Maduro. Also present granddaughter [name]  Discussion: We discussed goals of care for [facility] .  Presented comatose to [facility]. She was intubated for airway protection, and found to be in multiple organ failure and the following problem list  Principal Problem:   Acute metabolic encephalopathy Active Problems:   Carotid stenosis, bilateral   Cirrhosis of liver without ascites (HCC)   CVA (cerebral vascular accident) (HCC)   Cancer, hepatocellular (HCC)   Hepatic encephalopathy (HCC)   Cerebral edema (HCC)   Protein-calorie malnutrition, severe (HCC)   Respirator dependence (HCC)   Lactic acidosis   Hypoglycemia   Pulmonary nodule   Acute renal failure superimposed on stage 3a chronic kidney disease (HCC)   Hypernatremia   Hyperchloremia   Leukocytosis   Dysphagia   CAD (coronary artery disease)   OSA (obstructive sleep apnea)   Aortic insufficiency   Persistent atrial fibrillation (HCC)   Chronic diastolic HF (heart failure) (HCC)   GERD (gastroesophageal reflux disease)   Esophageal varices without bleeding (HCC)   Seizure disorder (HCC)   Type 2 diabetes mellitus with stage 3 chronic kidney disease and hypertension (HCC)   Coagulopathy (HCC)   Pressure ulcer of sacral region, stage 1   DNR (do not resuscitate)  We discussed current findings. Treatment options and also her underlying malignancy. Family describes to me that she really has been failing over the last week. I have recommended comfort and the family is in agreement   Code status:   Code Status: Do not attempt resuscitation (DNR) PRE-ARREST INTERVENTIONS DESIRED   Disposition: In-patient comfort care  Time spent for the  meeting: 30 minutes.    Shelby Mattocks, NP  08/28/2023, 8:38 PM

## 2023-08-28 NOTE — Sepsis Progress Note (Signed)
Code Sepsis protocol being monitored by eLink. 

## 2023-08-28 NOTE — Sepsis Progress Note (Signed)
Notified bedside paramedic of need to administer antibiotics and fluid bolus.

## 2023-08-30 LAB — URINE CULTURE: Culture: 20000 — AB

## 2023-09-02 LAB — CULTURE, BLOOD (ROUTINE X 2): Culture: NO GROWTH

## 2023-09-09 NOTE — Progress Notes (Signed)
eLink Physician-Brief Progress Note Patient Name: [name] DOB: 08-Jul-1941 MRN: [id number]   Date of Service  08/16/2023  HPI/Events of Note  Patient transitioned to comfort care. Expired at 0351 on 08/09/2023  eICU Interventions       Intervention Category Minor Interventions: Other:  [name] [name] 08/15/2023, 4:11 AM

## 2023-09-09 DEATH — deceased

## 2023-10-30 ENCOUNTER — Inpatient Hospital Stay: Payer: Medicare Other | Admitting: Diagnostic Neuroimaging

## 2023-11-03 ENCOUNTER — Ambulatory Visit (INDEPENDENT_AMBULATORY_CARE_PROVIDER_SITE_OTHER): Payer: Medicare Other | Admitting: Gastroenterology

## 2023-12-15 ENCOUNTER — Ambulatory Visit: Payer: Medicare Other | Admitting: Cardiology
